# Patient Record
Sex: Female | Born: 1945
Health system: Southern US, Community
[De-identification: ages and names within clinical notes are randomized; demographics above are authoritative.]

## PROBLEM LIST (undated history)

## (undated) DIAGNOSIS — N186 End stage renal disease: Secondary | ICD-10-CM

## (undated) DIAGNOSIS — A4901 Methicillin susceptible Staphylococcus aureus infection, unspecified site: Secondary | ICD-10-CM

## (undated) DIAGNOSIS — R0600 Dyspnea, unspecified: Secondary | ICD-10-CM

## (undated) DIAGNOSIS — R51 Headache: Secondary | ICD-10-CM

## (undated) DIAGNOSIS — G8929 Other chronic pain: Secondary | ICD-10-CM

## (undated) DIAGNOSIS — M25471 Effusion, right ankle: Secondary | ICD-10-CM

## (undated) DIAGNOSIS — D649 Anemia, unspecified: Secondary | ICD-10-CM

## (undated) DIAGNOSIS — H5789 Other specified disorders of eye and adnexa: Secondary | ICD-10-CM

## (undated) DIAGNOSIS — I10 Essential (primary) hypertension: Secondary | ICD-10-CM

## (undated) DIAGNOSIS — M545 Low back pain, unspecified: Secondary | ICD-10-CM

## (undated) DIAGNOSIS — T1490XA Injury, unspecified, initial encounter: Secondary | ICD-10-CM

## (undated) DIAGNOSIS — IMO0002 Reserved for concepts with insufficient information to code with codable children: Secondary | ICD-10-CM

## (undated) DIAGNOSIS — I2510 Atherosclerotic heart disease of native coronary artery without angina pectoris: Secondary | ICD-10-CM

## (undated) DIAGNOSIS — M1990 Unspecified osteoarthritis, unspecified site: Secondary | ICD-10-CM

## (undated) DIAGNOSIS — R011 Cardiac murmur, unspecified: Secondary | ICD-10-CM

## (undated) DIAGNOSIS — G4733 Obstructive sleep apnea (adult) (pediatric): Secondary | ICD-10-CM

## (undated) DIAGNOSIS — M109 Gout, unspecified: Secondary | ICD-10-CM

## (undated) DIAGNOSIS — M25472 Effusion, left ankle: Secondary | ICD-10-CM

## (undated) DIAGNOSIS — E119 Type 2 diabetes mellitus without complications: Secondary | ICD-10-CM

## (undated) DIAGNOSIS — Z8719 Personal history of other diseases of the digestive system: Secondary | ICD-10-CM

## (undated) DIAGNOSIS — Z992 Dependence on renal dialysis: Secondary | ICD-10-CM

## (undated) DIAGNOSIS — R0602 Shortness of breath: Secondary | ICD-10-CM

## (undated) DIAGNOSIS — I219 Acute myocardial infarction, unspecified: Secondary | ICD-10-CM

## (undated) DIAGNOSIS — H5440 Blindness, one eye, unspecified eye: Secondary | ICD-10-CM

## (undated) DIAGNOSIS — I499 Cardiac arrhythmia, unspecified: Secondary | ICD-10-CM

## (undated) DIAGNOSIS — G709 Myoneural disorder, unspecified: Secondary | ICD-10-CM

## (undated) DIAGNOSIS — Z9289 Personal history of other medical treatment: Secondary | ICD-10-CM

## (undated) DIAGNOSIS — I509 Heart failure, unspecified: Secondary | ICD-10-CM

## (undated) DIAGNOSIS — Z9989 Dependence on other enabling machines and devices: Secondary | ICD-10-CM

## (undated) HISTORY — PX: LUMBAR DISC SURGERY: SHX700

## (undated) HISTORY — DX: Reserved for concepts with insufficient information to code with codable children: IMO0002

## (undated) HISTORY — PX: HERNIA REPAIR: SHX51

## (undated) HISTORY — PX: VENTRAL HERNIA REPAIR: SHX424

## (undated) HISTORY — PX: BACK SURGERY: SHX140

## (undated) HISTORY — PX: JOINT REPLACEMENT: SHX530

## (undated) HISTORY — DX: Unspecified osteoarthritis, unspecified site: M19.90

## (undated) HISTORY — DX: Methicillin susceptible Staphylococcus aureus infection, unspecified site: A49.01

## (undated) HISTORY — PX: APPENDECTOMY: SHX54

## (undated) HISTORY — PX: EYE SURGERY: SHX253

---
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
## 1968-09-20 HISTORY — PX: VAGINAL HYSTERECTOMY: SUR661

## 1978-09-21 HISTORY — PX: CARDIAC CATHETERIZATION: SHX172

## 1978-09-21 HISTORY — PX: TOTAL KNEE ARTHROPLASTY: SHX125

## 1988-09-20 HISTORY — PX: CATARACT EXTRACTION W/ INTRAOCULAR LENS IMPLANT: SHX1309

## 1997-05-19 ENCOUNTER — Encounter: Admission: RE | Admit: 1997-05-19 | Discharge: 1997-05-19 | Payer: Self-pay | Admitting: Internal Medicine

## 1997-06-07 ENCOUNTER — Encounter: Admission: RE | Admit: 1997-06-07 | Discharge: 1997-06-07 | Payer: Self-pay | Admitting: Hematology and Oncology

## 1997-07-06 ENCOUNTER — Encounter: Admission: RE | Admit: 1997-07-06 | Discharge: 1997-07-06 | Payer: Self-pay | Admitting: Hematology and Oncology

## 1997-08-07 ENCOUNTER — Encounter: Admission: RE | Admit: 1997-08-07 | Discharge: 1997-08-07 | Payer: Self-pay | Admitting: Internal Medicine

## 1997-11-13 ENCOUNTER — Encounter: Admission: RE | Admit: 1997-11-13 | Discharge: 1997-11-13 | Payer: Self-pay | Admitting: Internal Medicine

## 1997-11-13 ENCOUNTER — Ambulatory Visit (HOSPITAL_COMMUNITY): Admission: RE | Admit: 1997-11-13 | Discharge: 1997-11-13 | Payer: Self-pay | Admitting: Internal Medicine

## 1997-11-30 ENCOUNTER — Ambulatory Visit (HOSPITAL_COMMUNITY): Admission: RE | Admit: 1997-11-30 | Discharge: 1997-11-30 | Payer: Self-pay

## 1997-12-22 ENCOUNTER — Encounter: Admission: RE | Admit: 1997-12-22 | Discharge: 1997-12-22 | Payer: Self-pay | Admitting: Internal Medicine

## 1998-03-18 ENCOUNTER — Ambulatory Visit: Admission: RE | Admit: 1998-03-18 | Discharge: 1998-03-18 | Payer: Self-pay

## 1998-05-01 ENCOUNTER — Encounter: Admission: RE | Admit: 1998-05-01 | Discharge: 1998-05-01 | Payer: Self-pay | Admitting: Hematology and Oncology

## 1998-06-17 ENCOUNTER — Ambulatory Visit: Admission: RE | Admit: 1998-06-17 | Discharge: 1998-06-17 | Payer: Self-pay

## 1998-07-27 ENCOUNTER — Encounter: Admission: RE | Admit: 1998-07-27 | Discharge: 1998-07-27 | Payer: Self-pay | Admitting: Internal Medicine

## 1998-08-08 ENCOUNTER — Encounter: Admission: RE | Admit: 1998-08-08 | Discharge: 1998-08-08 | Payer: Self-pay | Admitting: Hematology and Oncology

## 1998-08-20 ENCOUNTER — Encounter: Admission: RE | Admit: 1998-08-20 | Discharge: 1998-08-20 | Payer: Self-pay | Admitting: Internal Medicine

## 1998-09-26 ENCOUNTER — Ambulatory Visit (HOSPITAL_COMMUNITY): Admission: RE | Admit: 1998-09-26 | Discharge: 1998-09-26 | Payer: Self-pay | Admitting: Orthopedic Surgery

## 1998-09-26 ENCOUNTER — Encounter: Payer: Self-pay | Admitting: Orthopedic Surgery

## 1998-10-16 ENCOUNTER — Encounter: Payer: Self-pay | Admitting: *Deleted

## 1998-10-16 ENCOUNTER — Ambulatory Visit (HOSPITAL_COMMUNITY): Admission: RE | Admit: 1998-10-16 | Discharge: 1998-10-16 | Payer: Self-pay | Admitting: Internal Medicine

## 1998-10-16 ENCOUNTER — Encounter: Admission: RE | Admit: 1998-10-16 | Discharge: 1998-10-16 | Payer: Self-pay | Admitting: Internal Medicine

## 1998-11-19 ENCOUNTER — Encounter: Admission: RE | Admit: 1998-11-19 | Discharge: 1998-11-19 | Payer: Self-pay

## 1998-11-26 ENCOUNTER — Encounter: Admission: RE | Admit: 1998-11-26 | Discharge: 1998-11-26 | Payer: Self-pay | Admitting: Hematology and Oncology

## 1999-01-28 ENCOUNTER — Encounter: Payer: Self-pay | Admitting: Internal Medicine

## 1999-01-28 ENCOUNTER — Encounter: Admission: RE | Admit: 1999-01-28 | Discharge: 1999-01-28 | Payer: Self-pay | Admitting: Internal Medicine

## 1999-01-28 ENCOUNTER — Ambulatory Visit (HOSPITAL_COMMUNITY): Admission: RE | Admit: 1999-01-28 | Discharge: 1999-01-28 | Payer: Self-pay | Admitting: Internal Medicine

## 1999-02-04 ENCOUNTER — Encounter: Admission: RE | Admit: 1999-02-04 | Discharge: 1999-02-04 | Payer: Self-pay | Admitting: Internal Medicine

## 1999-02-11 ENCOUNTER — Encounter: Admission: RE | Admit: 1999-02-11 | Discharge: 1999-02-11 | Payer: Self-pay | Admitting: Internal Medicine

## 1999-02-18 ENCOUNTER — Encounter: Payer: Self-pay | Admitting: Emergency Medicine

## 1999-02-18 ENCOUNTER — Inpatient Hospital Stay (HOSPITAL_COMMUNITY): Admission: EM | Admit: 1999-02-18 | Discharge: 1999-02-22 | Payer: Self-pay | Admitting: Emergency Medicine

## 1999-02-27 ENCOUNTER — Encounter: Admission: RE | Admit: 1999-02-27 | Discharge: 1999-02-27 | Payer: Self-pay | Admitting: Internal Medicine

## 1999-05-27 ENCOUNTER — Encounter: Admission: RE | Admit: 1999-05-27 | Discharge: 1999-05-27 | Payer: Self-pay | Admitting: Internal Medicine

## 1999-12-29 ENCOUNTER — Inpatient Hospital Stay (HOSPITAL_COMMUNITY): Admission: EM | Admit: 1999-12-29 | Discharge: 2000-01-02 | Payer: Self-pay

## 1999-12-29 ENCOUNTER — Encounter: Payer: Self-pay | Admitting: Emergency Medicine

## 1999-12-29 ENCOUNTER — Encounter: Payer: Self-pay | Admitting: Internal Medicine

## 1999-12-31 ENCOUNTER — Encounter: Payer: Self-pay | Admitting: Internal Medicine

## 2000-02-10 ENCOUNTER — Encounter: Admission: RE | Admit: 2000-02-10 | Discharge: 2000-02-10 | Payer: Self-pay

## 2000-02-10 ENCOUNTER — Ambulatory Visit (HOSPITAL_COMMUNITY): Admission: RE | Admit: 2000-02-10 | Discharge: 2000-02-10 | Payer: Self-pay | Admitting: Hematology and Oncology

## 2000-02-12 ENCOUNTER — Encounter: Admission: RE | Admit: 2000-02-12 | Discharge: 2000-05-12 | Payer: Self-pay

## 2000-02-21 ENCOUNTER — Ambulatory Visit (HOSPITAL_COMMUNITY): Admission: RE | Admit: 2000-02-21 | Discharge: 2000-02-21 | Payer: Self-pay

## 2000-03-16 ENCOUNTER — Encounter: Admission: RE | Admit: 2000-03-16 | Discharge: 2000-03-16 | Payer: Self-pay

## 2000-04-27 ENCOUNTER — Encounter: Admission: RE | Admit: 2000-04-27 | Discharge: 2000-04-27 | Payer: Self-pay

## 2000-05-04 ENCOUNTER — Encounter: Admission: RE | Admit: 2000-05-04 | Discharge: 2000-05-04 | Payer: Self-pay

## 2000-05-20 ENCOUNTER — Encounter: Admission: RE | Admit: 2000-05-20 | Discharge: 2000-05-20 | Payer: Self-pay | Admitting: Hematology and Oncology

## 2000-06-02 ENCOUNTER — Encounter: Admission: RE | Admit: 2000-06-02 | Discharge: 2000-06-02 | Payer: Self-pay

## 2000-06-02 ENCOUNTER — Ambulatory Visit (HOSPITAL_COMMUNITY): Admission: RE | Admit: 2000-06-02 | Discharge: 2000-06-02 | Payer: Self-pay

## 2000-09-14 ENCOUNTER — Encounter: Admission: RE | Admit: 2000-09-14 | Discharge: 2000-09-14 | Payer: Self-pay | Admitting: Internal Medicine

## 2000-09-17 ENCOUNTER — Encounter: Admission: RE | Admit: 2000-09-17 | Discharge: 2000-09-17 | Payer: Self-pay | Admitting: Internal Medicine

## 2000-10-20 ENCOUNTER — Encounter: Admission: RE | Admit: 2000-10-20 | Discharge: 2000-10-20 | Payer: Self-pay | Admitting: Internal Medicine

## 2001-01-26 ENCOUNTER — Ambulatory Visit (HOSPITAL_COMMUNITY): Admission: RE | Admit: 2001-01-26 | Discharge: 2001-01-26 | Payer: Self-pay

## 2001-01-26 ENCOUNTER — Encounter: Payer: Self-pay | Admitting: Internal Medicine

## 2001-01-26 ENCOUNTER — Inpatient Hospital Stay (HOSPITAL_COMMUNITY): Admission: AD | Admit: 2001-01-26 | Discharge: 2001-01-29 | Payer: Self-pay

## 2001-01-26 ENCOUNTER — Encounter: Admission: RE | Admit: 2001-01-26 | Discharge: 2001-01-26 | Payer: Self-pay

## 2001-01-27 ENCOUNTER — Encounter: Payer: Self-pay | Admitting: Internal Medicine

## 2001-02-05 ENCOUNTER — Encounter: Admission: RE | Admit: 2001-02-05 | Discharge: 2001-02-05 | Payer: Self-pay | Admitting: Internal Medicine

## 2001-02-09 ENCOUNTER — Encounter: Admission: RE | Admit: 2001-02-09 | Discharge: 2001-02-09 | Payer: Self-pay

## 2001-02-16 ENCOUNTER — Encounter: Admission: RE | Admit: 2001-02-16 | Discharge: 2001-02-16 | Payer: Self-pay | Admitting: Internal Medicine

## 2001-03-04 ENCOUNTER — Encounter: Admission: RE | Admit: 2001-03-04 | Discharge: 2001-03-04 | Payer: Self-pay | Admitting: Internal Medicine

## 2001-04-01 ENCOUNTER — Ambulatory Visit (HOSPITAL_COMMUNITY): Admission: RE | Admit: 2001-04-01 | Discharge: 2001-04-01 | Payer: Self-pay | Admitting: Internal Medicine

## 2001-06-23 ENCOUNTER — Ambulatory Visit (HOSPITAL_COMMUNITY): Admission: RE | Admit: 2001-06-23 | Discharge: 2001-06-23 | Payer: Self-pay | Admitting: Internal Medicine

## 2001-06-23 ENCOUNTER — Encounter: Payer: Self-pay | Admitting: Internal Medicine

## 2001-06-23 ENCOUNTER — Encounter: Admission: RE | Admit: 2001-06-23 | Discharge: 2001-06-23 | Payer: Self-pay | Admitting: Internal Medicine

## 2001-09-28 ENCOUNTER — Encounter: Admission: RE | Admit: 2001-09-28 | Discharge: 2001-09-28 | Payer: Self-pay | Admitting: Internal Medicine

## 2001-09-28 ENCOUNTER — Ambulatory Visit (HOSPITAL_COMMUNITY): Admission: RE | Admit: 2001-09-28 | Discharge: 2001-09-28 | Payer: Self-pay | Admitting: Internal Medicine

## 2001-10-12 ENCOUNTER — Encounter: Admission: RE | Admit: 2001-10-12 | Discharge: 2001-10-12 | Payer: Self-pay | Admitting: Internal Medicine

## 2001-12-26 ENCOUNTER — Inpatient Hospital Stay (HOSPITAL_COMMUNITY): Admission: EM | Admit: 2001-12-26 | Discharge: 2001-12-31 | Payer: Self-pay | Admitting: Emergency Medicine

## 2001-12-26 ENCOUNTER — Encounter: Payer: Self-pay | Admitting: Emergency Medicine

## 2001-12-27 ENCOUNTER — Encounter: Payer: Self-pay | Admitting: Internal Medicine

## 2001-12-29 ENCOUNTER — Encounter: Payer: Self-pay | Admitting: Internal Medicine

## 2002-01-06 ENCOUNTER — Encounter: Admission: RE | Admit: 2002-01-06 | Discharge: 2002-01-06 | Payer: Self-pay | Admitting: Internal Medicine

## 2002-01-11 ENCOUNTER — Encounter: Admission: RE | Admit: 2002-01-11 | Discharge: 2002-01-11 | Payer: Self-pay | Admitting: Internal Medicine

## 2002-03-19 ENCOUNTER — Encounter: Payer: Self-pay | Admitting: Emergency Medicine

## 2002-03-19 ENCOUNTER — Emergency Department (HOSPITAL_COMMUNITY): Admission: EM | Admit: 2002-03-19 | Discharge: 2002-03-19 | Payer: Self-pay | Admitting: Emergency Medicine

## 2002-03-22 ENCOUNTER — Encounter: Admission: RE | Admit: 2002-03-22 | Discharge: 2002-03-22 | Payer: Self-pay | Admitting: Internal Medicine

## 2002-04-04 ENCOUNTER — Encounter: Payer: Self-pay | Admitting: Internal Medicine

## 2002-04-04 ENCOUNTER — Ambulatory Visit (HOSPITAL_COMMUNITY): Admission: RE | Admit: 2002-04-04 | Discharge: 2002-04-04 | Payer: Self-pay | Admitting: Internal Medicine

## 2002-04-05 ENCOUNTER — Encounter: Admission: RE | Admit: 2002-04-05 | Discharge: 2002-04-05 | Payer: Self-pay | Admitting: Internal Medicine

## 2002-04-27 ENCOUNTER — Encounter (INDEPENDENT_AMBULATORY_CARE_PROVIDER_SITE_OTHER): Payer: Self-pay

## 2002-04-27 ENCOUNTER — Encounter: Payer: Self-pay | Admitting: Emergency Medicine

## 2002-04-28 ENCOUNTER — Encounter: Payer: Self-pay | Admitting: Internal Medicine

## 2002-04-28 ENCOUNTER — Inpatient Hospital Stay (HOSPITAL_COMMUNITY): Admission: EM | Admit: 2002-04-28 | Discharge: 2002-05-08 | Payer: Self-pay | Admitting: Emergency Medicine

## 2002-05-02 ENCOUNTER — Encounter: Payer: Self-pay | Admitting: Internal Medicine

## 2002-05-08 ENCOUNTER — Inpatient Hospital Stay: Admission: RE | Admit: 2002-05-08 | Discharge: 2002-05-12 | Payer: Self-pay | Admitting: Internal Medicine

## 2002-05-10 ENCOUNTER — Ambulatory Visit (HOSPITAL_COMMUNITY): Admission: RE | Admit: 2002-05-10 | Discharge: 2002-05-10 | Payer: Self-pay | Admitting: Internal Medicine

## 2002-05-31 ENCOUNTER — Encounter: Admission: RE | Admit: 2002-05-31 | Discharge: 2002-05-31 | Payer: Self-pay | Admitting: Internal Medicine

## 2002-06-14 ENCOUNTER — Encounter: Admission: RE | Admit: 2002-06-14 | Discharge: 2002-06-14 | Payer: Self-pay | Admitting: Internal Medicine

## 2002-10-24 ENCOUNTER — Ambulatory Visit (HOSPITAL_COMMUNITY): Admission: RE | Admit: 2002-10-24 | Discharge: 2002-10-24 | Payer: Self-pay | Admitting: Internal Medicine

## 2002-10-24 ENCOUNTER — Encounter: Payer: Self-pay | Admitting: Internal Medicine

## 2002-10-24 ENCOUNTER — Encounter: Admission: RE | Admit: 2002-10-24 | Discharge: 2002-10-24 | Payer: Self-pay | Admitting: Internal Medicine

## 2002-10-26 ENCOUNTER — Encounter: Admission: RE | Admit: 2002-10-26 | Discharge: 2002-10-26 | Payer: Self-pay | Admitting: Internal Medicine

## 2002-12-13 ENCOUNTER — Encounter: Admission: RE | Admit: 2002-12-13 | Discharge: 2002-12-13 | Payer: Self-pay | Admitting: Internal Medicine

## 2002-12-26 ENCOUNTER — Ambulatory Visit (HOSPITAL_BASED_OUTPATIENT_CLINIC_OR_DEPARTMENT_OTHER): Admission: RE | Admit: 2002-12-26 | Discharge: 2002-12-26 | Payer: Self-pay | Admitting: Internal Medicine

## 2003-03-10 ENCOUNTER — Encounter: Admission: RE | Admit: 2003-03-10 | Discharge: 2003-03-10 | Payer: Self-pay | Admitting: Internal Medicine

## 2003-03-16 ENCOUNTER — Encounter: Admission: RE | Admit: 2003-03-16 | Discharge: 2003-03-16 | Payer: Self-pay | Admitting: Internal Medicine

## 2003-04-05 ENCOUNTER — Encounter: Admission: RE | Admit: 2003-04-05 | Discharge: 2003-04-05 | Payer: Self-pay | Admitting: Internal Medicine

## 2003-05-12 ENCOUNTER — Encounter: Admission: RE | Admit: 2003-05-12 | Discharge: 2003-05-12 | Payer: Self-pay | Admitting: Internal Medicine

## 2003-06-03 ENCOUNTER — Inpatient Hospital Stay (HOSPITAL_COMMUNITY): Admission: EM | Admit: 2003-06-03 | Discharge: 2003-06-07 | Payer: Self-pay | Admitting: Emergency Medicine

## 2003-06-04 ENCOUNTER — Encounter: Admission: RE | Admit: 2003-06-04 | Discharge: 2003-06-04 | Payer: Self-pay | Admitting: Internal Medicine

## 2003-06-06 ENCOUNTER — Encounter (INDEPENDENT_AMBULATORY_CARE_PROVIDER_SITE_OTHER): Payer: Self-pay | Admitting: Cardiology

## 2003-07-07 ENCOUNTER — Encounter: Admission: RE | Admit: 2003-07-07 | Discharge: 2003-07-07 | Payer: Self-pay | Admitting: Internal Medicine

## 2003-07-27 ENCOUNTER — Encounter: Admission: RE | Admit: 2003-07-27 | Discharge: 2003-08-24 | Payer: Self-pay | Admitting: Internal Medicine

## 2003-09-05 IMAGING — CT CT ABDOMEN W/O CM
1 series · 15 of 32 positions shown, 19 images · non-contrast
Comparison: none

FINDINGS
CLINICAL DATA: RIGHT FLANK PAIN.
ABDOMEN CT WITHOUT CONTRAST
COMPARISON 12/01/95.
VISUALIZED PORTIONS OF LUNG BASES ARE CLEAR.  UNREMARKABLE NONCONTRAST EVALUATION OF VISUALIZED
PORTIONS OF LIVER AND SPLEEN, GALLBLADDER, ADRENAL GLANDS, PANCREAS, KIDNEYS.  PROXIMAL URETER IS
DECOMPRESSED.  SMALL BOWEL DECOMPRESSED.  NO FREE AIR.  NO ASCITES.  SUBCENTIMETER LEFT PARA-AORTIC
AND AORTOCAVAL LYMPH NODES.  NO ASCITES.  NO FREE AIR.  MINIMAL ATHEROMATOUS CHANGES AT THE AORTIC
BIFURCATION.  NO ANEURYSM.
IMPRESSION
UNREMARKABLE NONCONTRAST EVALUATION OF THE ABDOMEN.
CT PELVIS WITHOUT CONTRAST
SPIRAL CT WITHOUT ORAL OR IV CONTRAST.  THERE IS A MOBILE CECUM.  COLON IS DECOMPRESSED.  THE
APPENDIX IS NOT DISCRETELY IDENTIFIED.  NO INFLAMMATORY CHANGE OR EXTRALUMINAL FLUID COLLECTIONS
ARE EVIDENT, HOWEVER.  THE UTERUS IS APPARENTLY SURGICALLY ABSENT.  URINARY BLADDER PHYSIOLOGICALLY
DISTENDED.  PHLEBOLITHS IN THE LOWER PELVIS.  NO DISTAL URETERAL CALCULI ARE EVIDENT.
SUBCENTIMETER BILATERAL INGUINAL LYMPH NODES ARE NOTED.
UNREMARKABLE CT PELVIS.  THERE IS A MOBILE CECUM; THE APPENDIX IS NOT DISCRETELY IDENTIFIED.

[Series 2: renal stone · axial · 0.98mm/px · z∈[-481,-106]mm · 15 of 84 slices shown, 19 images]
[im 6/84  soft-tissue]
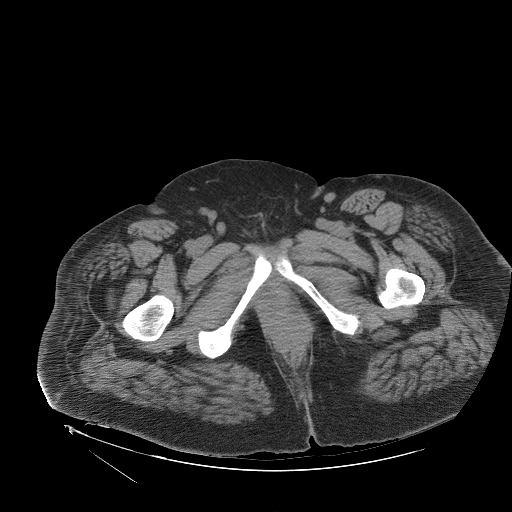
[im 6/84  bone]
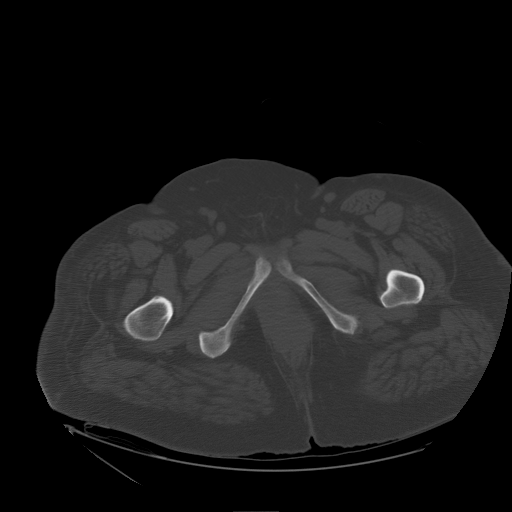
[im 11/84  soft-tissue]
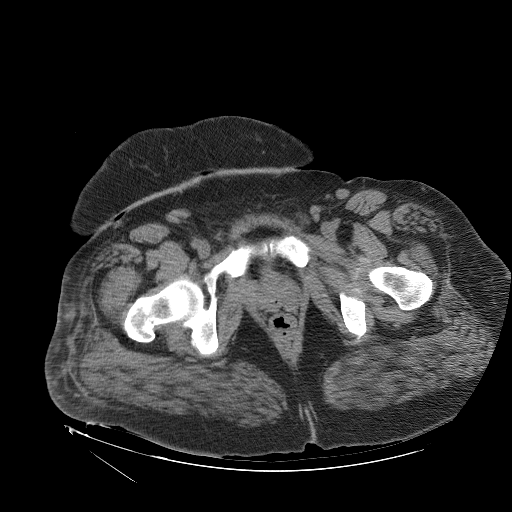
[im 17/84  soft-tissue]
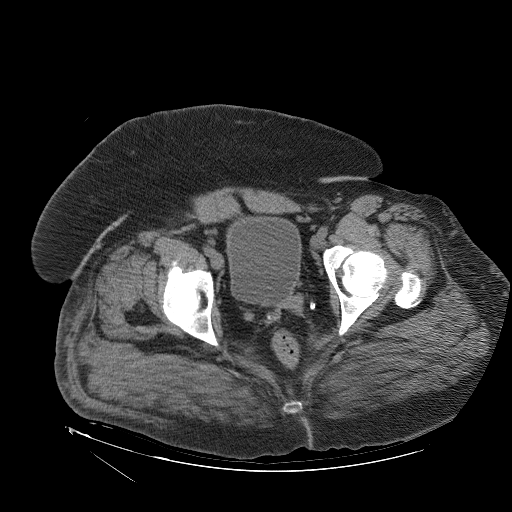
[im 25/84  soft-tissue]
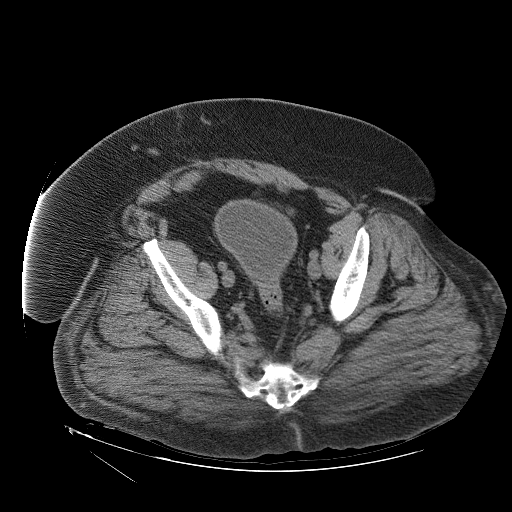
[im 30/84  soft-tissue]
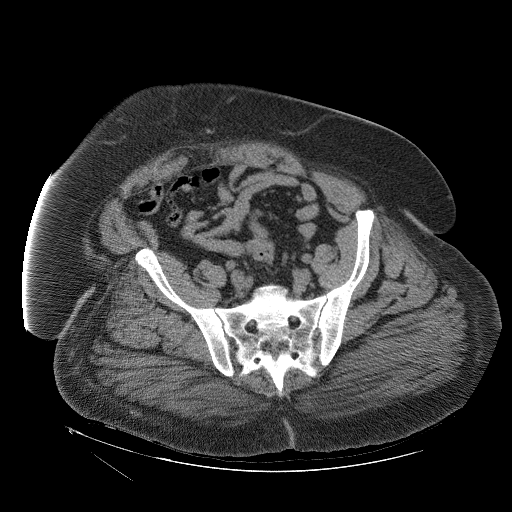
[im 35/84  soft-tissue]
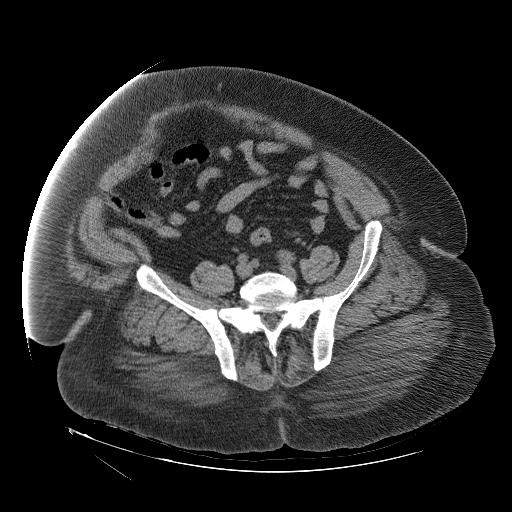
[im 43/84  soft-tissue]
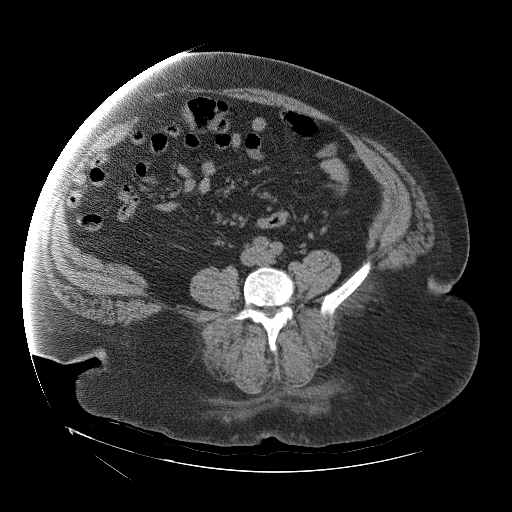
[im 49/84  soft-tissue]
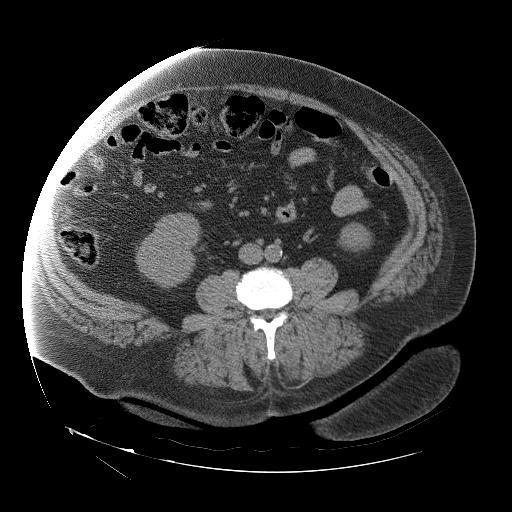
[im 54/84  soft-tissue]
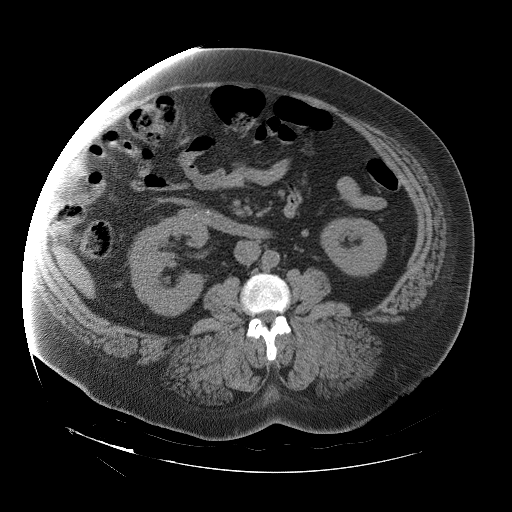
[im 54/84  bone]
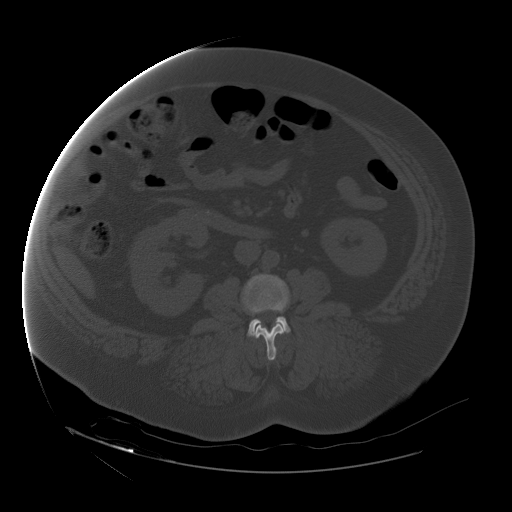
[im 59/84  soft-tissue]
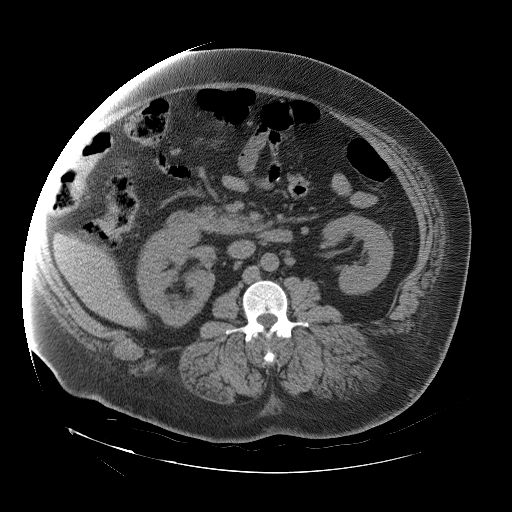
[im 67/84  soft-tissue]
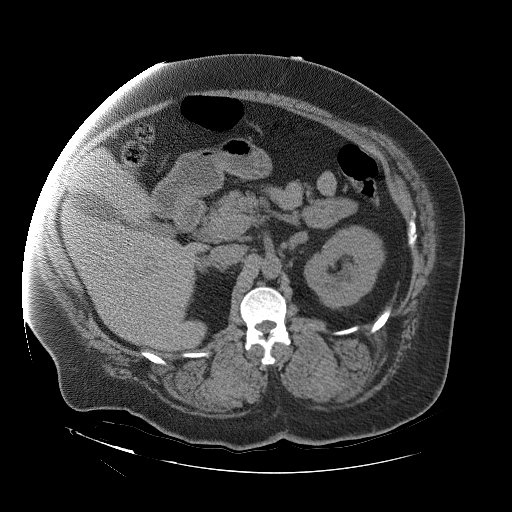
[im 73/84  soft-tissue]
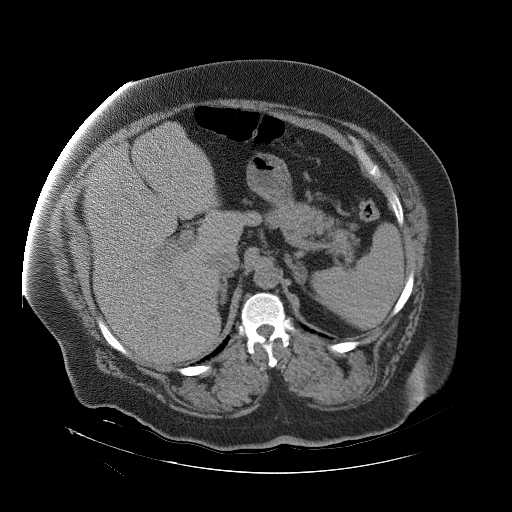
[im 73/84  lung]
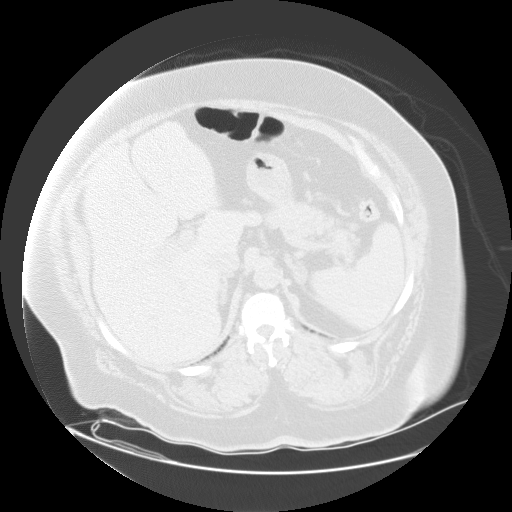
[im 75/84  lung]
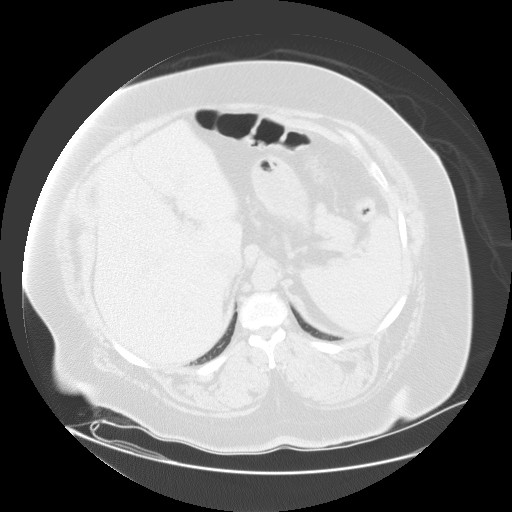
[im 78/84  soft-tissue]
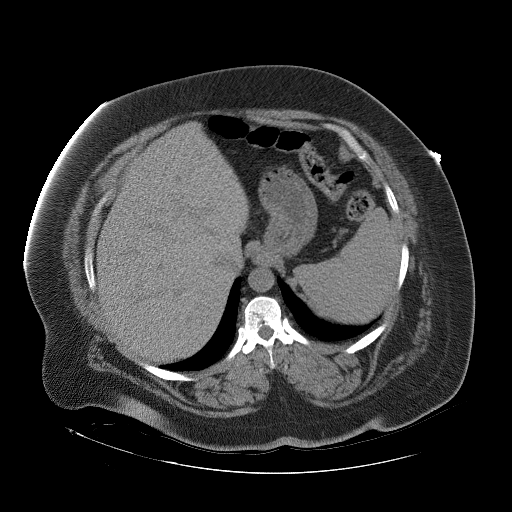
[im 78/84  lung]
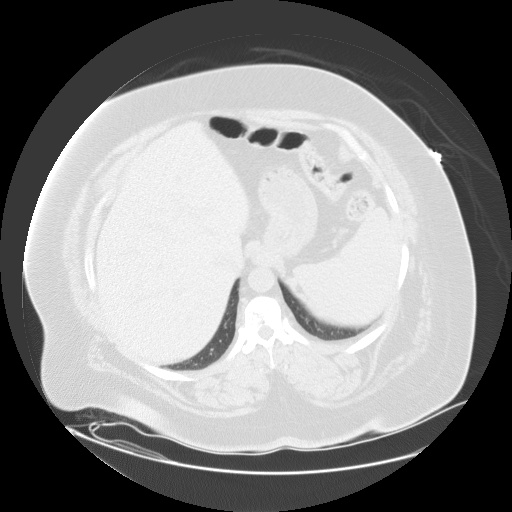
[im 81/84  lung]
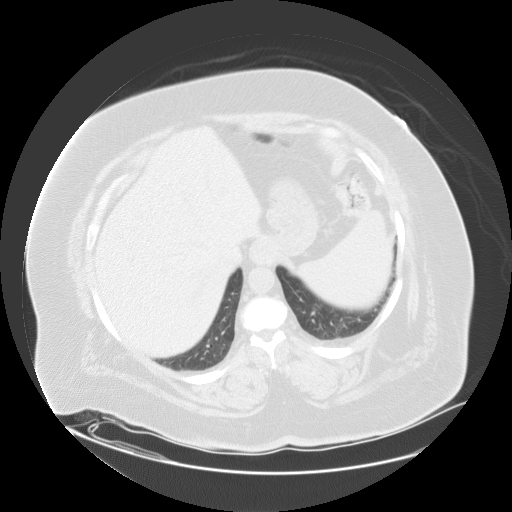

[15 of 32 positions shown; findings below may reference images not displayed]

## 2003-09-24 ENCOUNTER — Emergency Department (HOSPITAL_COMMUNITY): Admission: EM | Admit: 2003-09-24 | Discharge: 2003-09-25 | Payer: Self-pay | Admitting: Anesthesiology

## 2003-09-24 ENCOUNTER — Ambulatory Visit: Payer: Self-pay | Admitting: Internal Medicine

## 2003-09-25 ENCOUNTER — Inpatient Hospital Stay (HOSPITAL_COMMUNITY): Admission: EM | Admit: 2003-09-25 | Discharge: 2003-10-04 | Payer: Self-pay | Admitting: Emergency Medicine

## 2003-10-02 ENCOUNTER — Encounter (INDEPENDENT_AMBULATORY_CARE_PROVIDER_SITE_OTHER): Payer: Self-pay | Admitting: *Deleted

## 2003-10-14 ENCOUNTER — Emergency Department (HOSPITAL_COMMUNITY): Admission: EM | Admit: 2003-10-14 | Discharge: 2003-10-15 | Payer: Self-pay | Admitting: Emergency Medicine

## 2003-10-25 ENCOUNTER — Ambulatory Visit: Payer: Self-pay | Admitting: Internal Medicine

## 2003-10-30 ENCOUNTER — Encounter: Admission: RE | Admit: 2003-10-30 | Discharge: 2003-10-30 | Payer: Self-pay | Admitting: Internal Medicine

## 2003-11-02 ENCOUNTER — Ambulatory Visit (HOSPITAL_COMMUNITY): Admission: RE | Admit: 2003-11-02 | Discharge: 2003-11-02 | Payer: Self-pay | Admitting: Internal Medicine

## 2003-11-08 ENCOUNTER — Ambulatory Visit: Payer: Self-pay | Admitting: Internal Medicine

## 2003-11-14 ENCOUNTER — Ambulatory Visit: Payer: Self-pay | Admitting: Internal Medicine

## 2003-11-21 ENCOUNTER — Ambulatory Visit: Payer: Self-pay | Admitting: Internal Medicine

## 2003-11-21 ENCOUNTER — Inpatient Hospital Stay (HOSPITAL_COMMUNITY): Admission: AD | Admit: 2003-11-21 | Discharge: 2003-11-29 | Payer: Self-pay | Admitting: Internal Medicine

## 2003-11-21 ENCOUNTER — Emergency Department (HOSPITAL_COMMUNITY): Admission: EM | Admit: 2003-11-21 | Discharge: 2003-11-21 | Payer: Self-pay | Admitting: Emergency Medicine

## 2003-12-06 ENCOUNTER — Inpatient Hospital Stay (HOSPITAL_COMMUNITY): Admission: EM | Admit: 2003-12-06 | Discharge: 2003-12-18 | Payer: Self-pay | Admitting: Emergency Medicine

## 2003-12-06 ENCOUNTER — Ambulatory Visit: Payer: Self-pay | Admitting: Internal Medicine

## 2003-12-09 ENCOUNTER — Encounter (INDEPENDENT_AMBULATORY_CARE_PROVIDER_SITE_OTHER): Payer: Self-pay | Admitting: *Deleted

## 2003-12-18 ENCOUNTER — Inpatient Hospital Stay: Admission: RE | Admit: 2003-12-18 | Discharge: 2003-12-21 | Payer: Self-pay | Admitting: Internal Medicine

## 2004-04-12 ENCOUNTER — Encounter: Admission: RE | Admit: 2004-04-12 | Discharge: 2004-04-12 | Payer: Self-pay | Admitting: Cardiovascular Disease

## 2004-06-26 ENCOUNTER — Encounter: Admission: RE | Admit: 2004-06-26 | Discharge: 2004-07-08 | Payer: Self-pay | Admitting: Neurosurgery

## 2004-08-28 ENCOUNTER — Inpatient Hospital Stay (HOSPITAL_COMMUNITY): Admission: EM | Admit: 2004-08-28 | Discharge: 2004-09-13 | Payer: Self-pay | Admitting: Emergency Medicine

## 2004-08-28 ENCOUNTER — Ambulatory Visit: Payer: Self-pay | Admitting: Physical Medicine & Rehabilitation

## 2004-10-10 IMAGING — CR DG CHEST 2V
2 series · 2 of 2 positions shown · non-contrast
Comparison: none

CLINICAL DATA: Syncope.  Dizziness.  Headache.  Question congestive heart failure.  History of lung cancer.  Diabetic.
 AP SEMI-ERECT AND LATERAL CHEST, 06/03/03, [DATE] HOURS
 No comparisons.
 Cardiomegaly with pulmonary vascular congestion.  Tortuous aorta.  No plain film findings of pulmonary malignancy.  CT scanning can be obtained for further delineation if clinically desired.  No segmental infiltrate.
 IMPRESSION 
 Cardiomegaly and pulmonary vascular congestion.
 Tortuous aorta.
 No segmental infiltrate.  Please see above.

[view not recorded (1 of 2)]
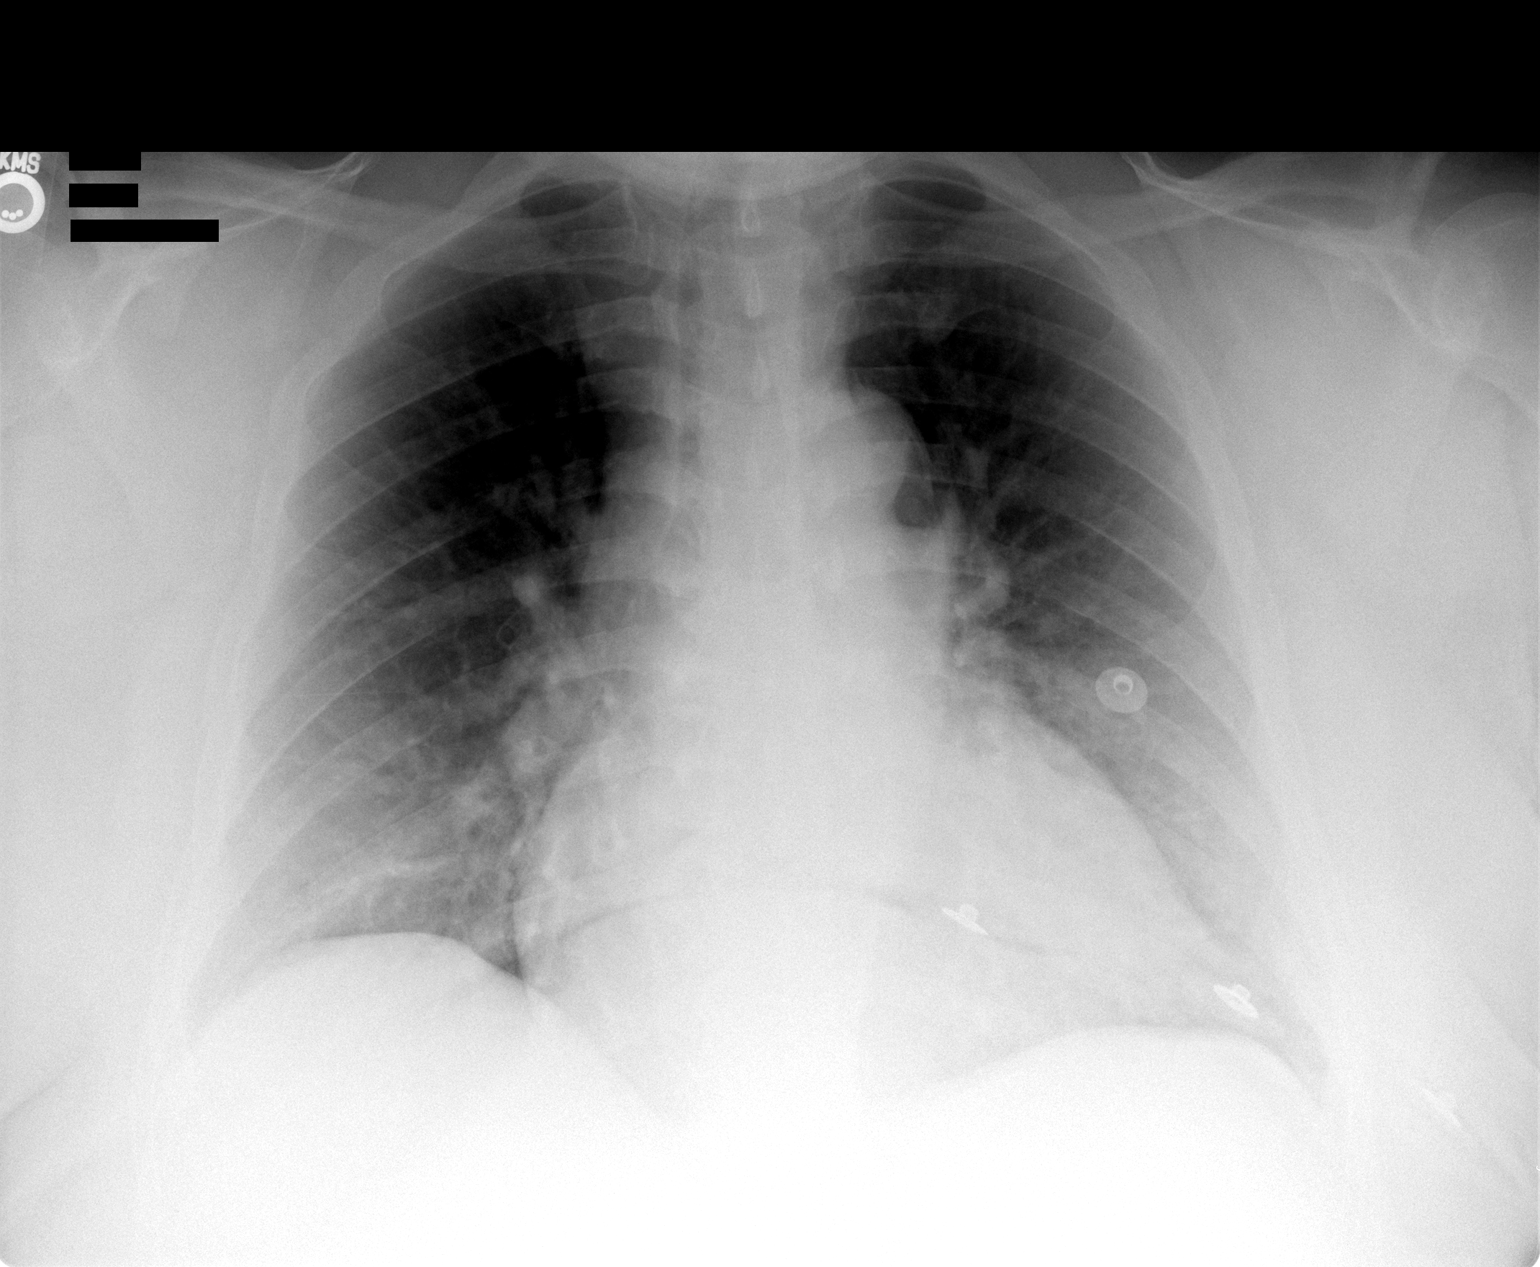

[view not recorded (2 of 2)]
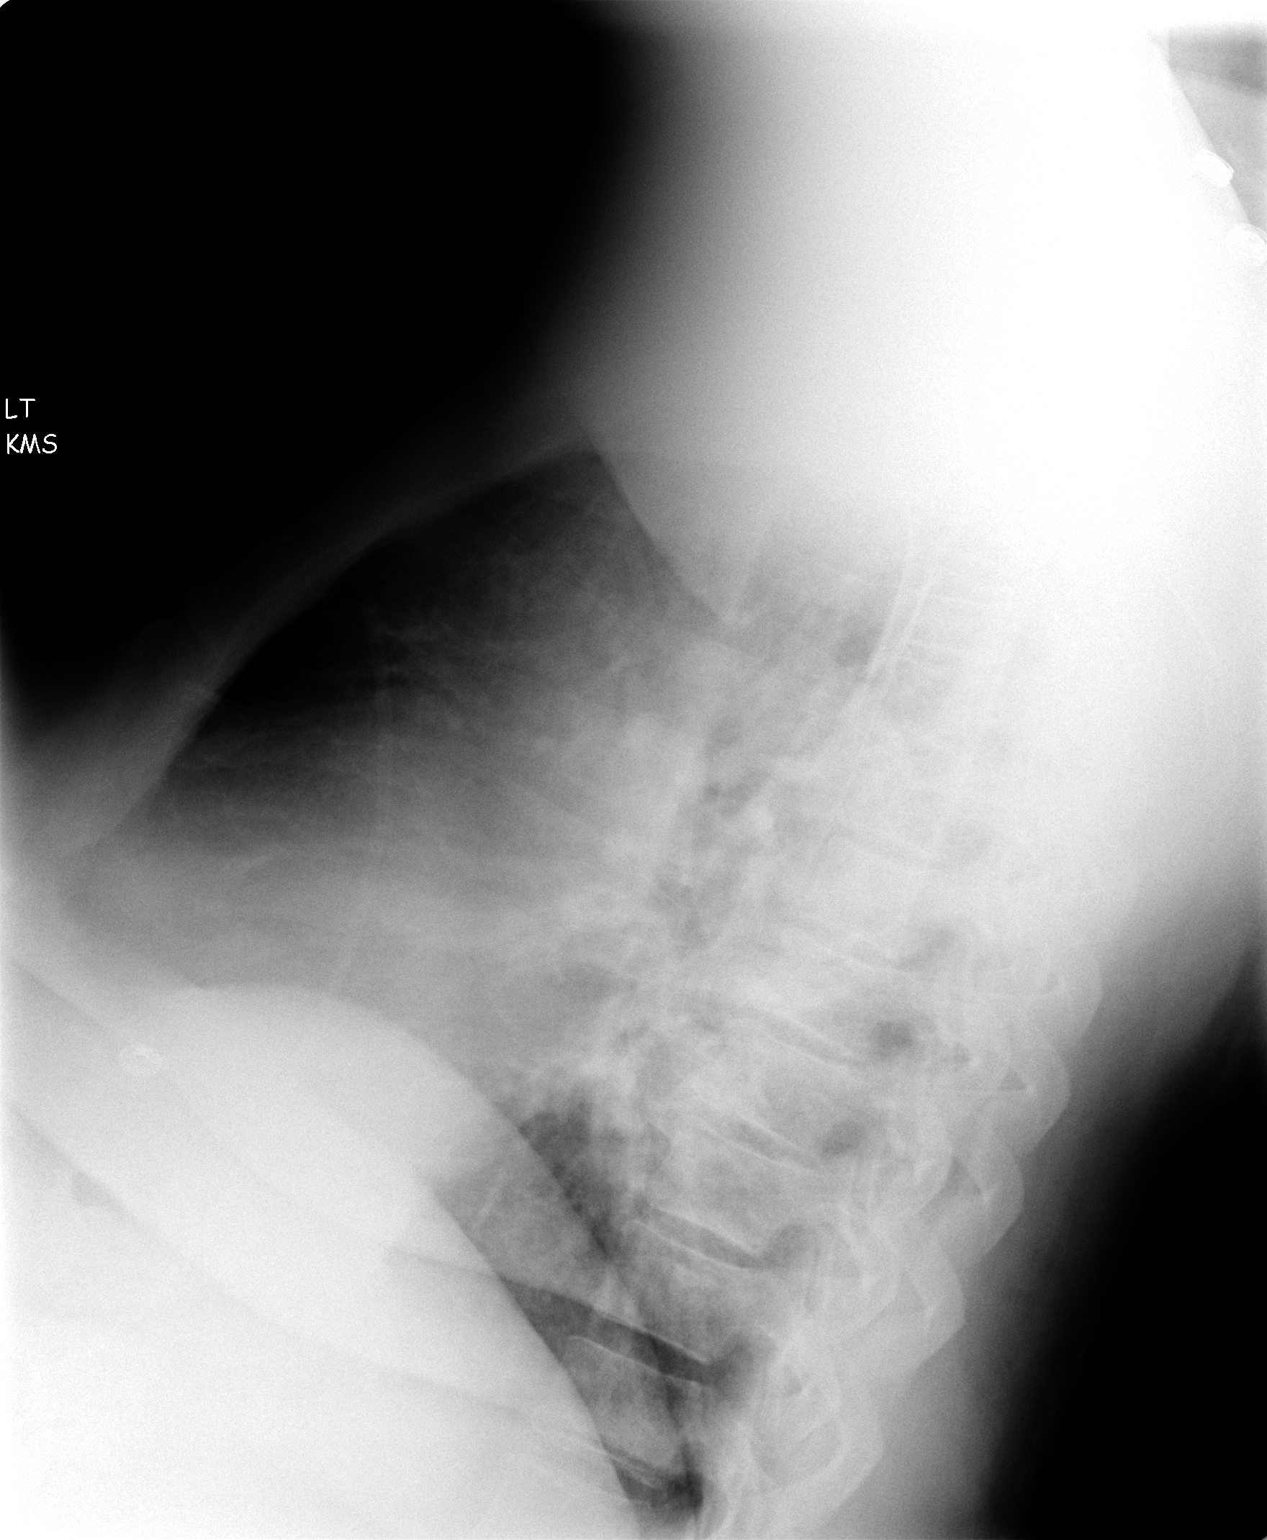

[2 of 2 positions shown; findings below may reference images not displayed]

## 2004-10-10 IMAGING — CT CT HEAD W/O CM
1 of 2 series · 13 of 30 positions shown, 17 images · non-contrast
Comparison: CT of the head 06/03/03.

***CORRECTED REPORT***
Please disregard MR head below; this has been reported separately by Dr Murtazaliev.   accession number [email or web address] for the head CT done 06/03/03 only, reported by Dr Pizzas.
CLINICAL DATA: Altered level of consciousness. 
CT OF THE HEAD WITHOUT CONTRAST
No comparison.  Routine unenhanced study was performed.  
There is no evidence of acute intracranial hemorrhage, mass effect, or extra-axial fluid collection.  The ventricles and subarachnoid spaces are appropriately sized for age.  No cortical based infarct is apparent.  There may be mild subcortical small vessel ischemic change in the parietal lobes, left greater than right.  The visualized paranasal sinuses are clear.  Calvarium is intact. 
IMPRESSION
No evidence of acute intracranial hemorrhage or acute stroke.  There may be mild small vessel ischemic change.  If there is clinical concern of acute stroke, followup CT or MRI should be considered.
CLINICAL DATA: TIAs.  Question infarct.  
MR HEAD WITHOUT CONTRAST (performed in open magnet at [REDACTED] as patient was not able to have closed magnet MRI secondary to habitus).

[Series 3: brain · axial · 0.47mm/px · z∈[+185,+297]mm · 13 of 44 slices shown, 17 images]
[im 4/44  brain]
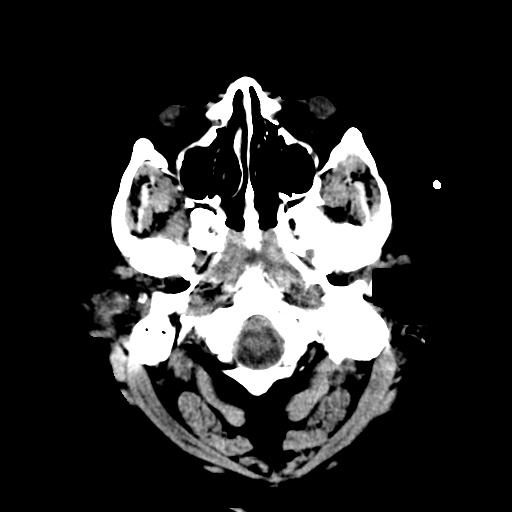
[im 4/44  bone]
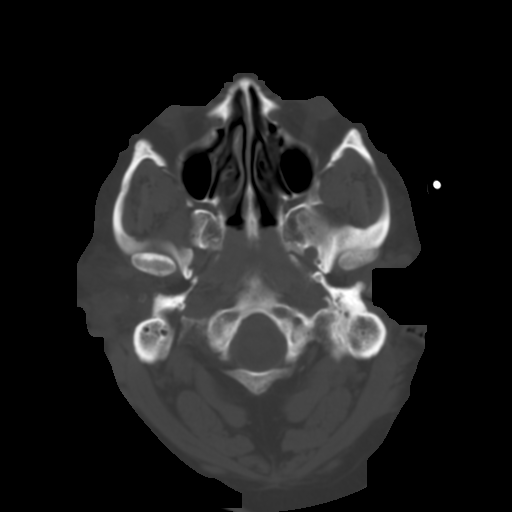
[im 7/44  brain]
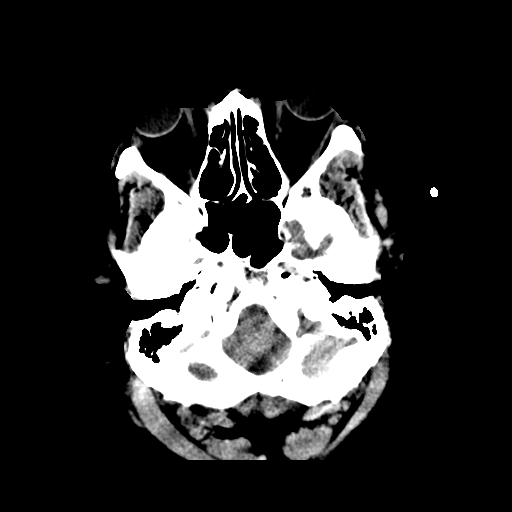
[im 10/44  brain]
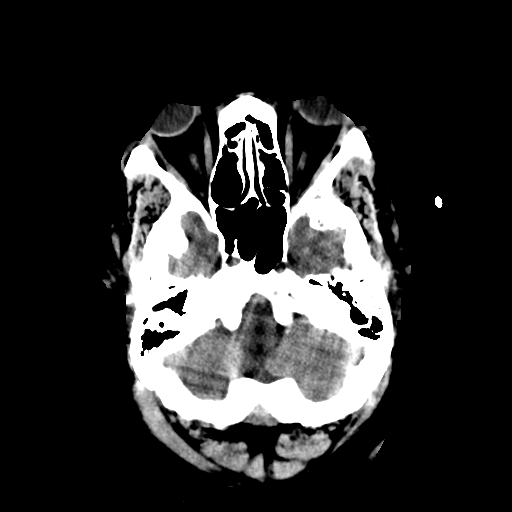
[im 13/44  brain]
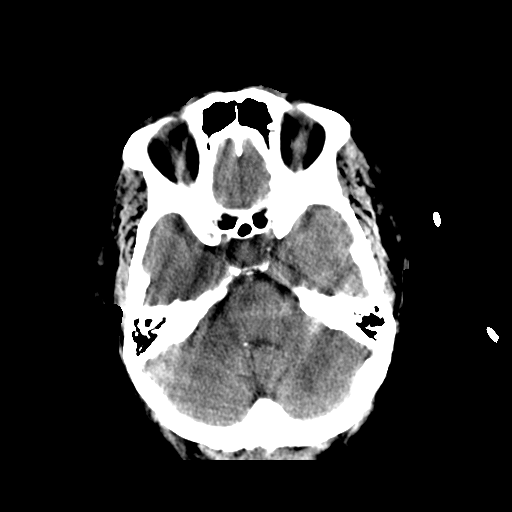
[im 16/44  brain]
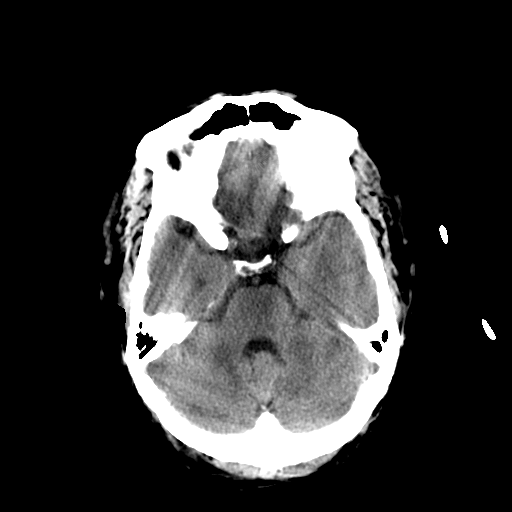
[im 16/44  bone]
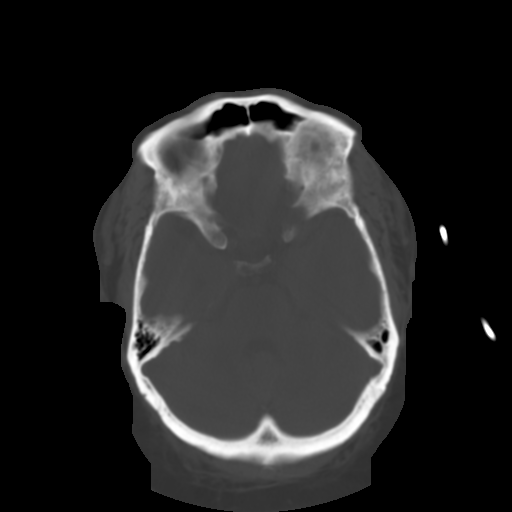
[im 19/44  brain]
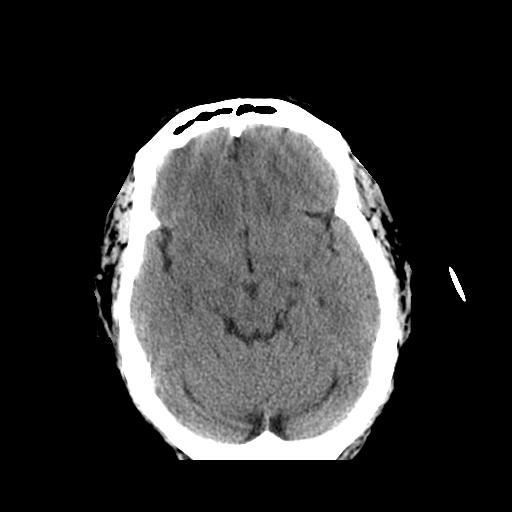
[im 22/44  brain]
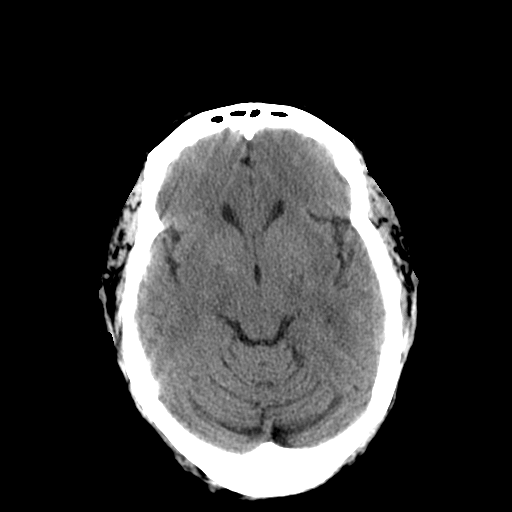
[im 25/44  brain]
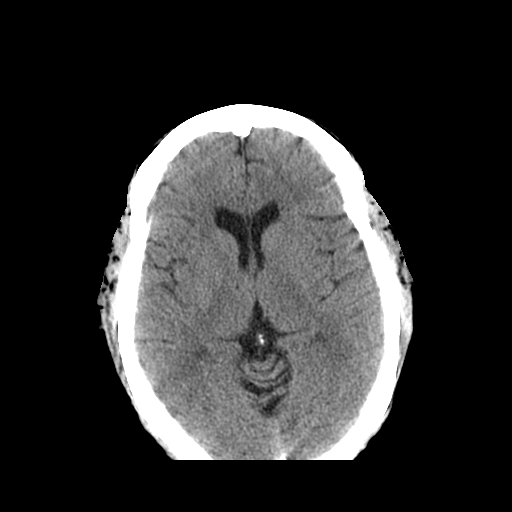
[im 28/44  brain]
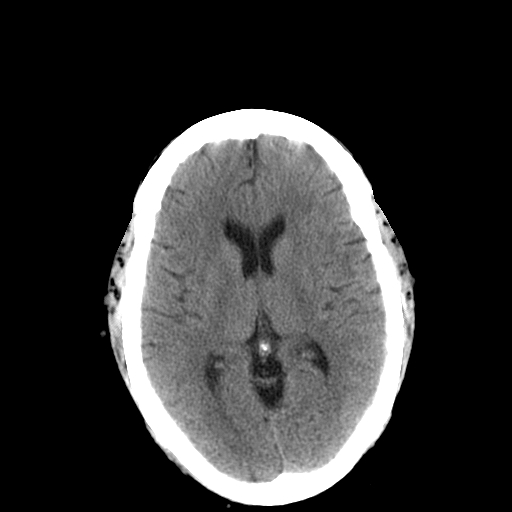
[im 28/44  bone]
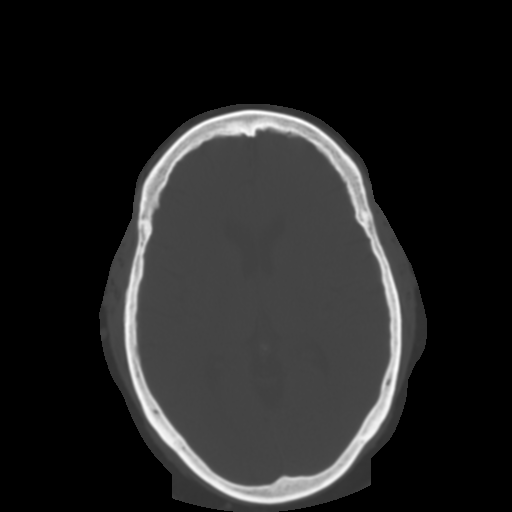
[im 31/44  brain]
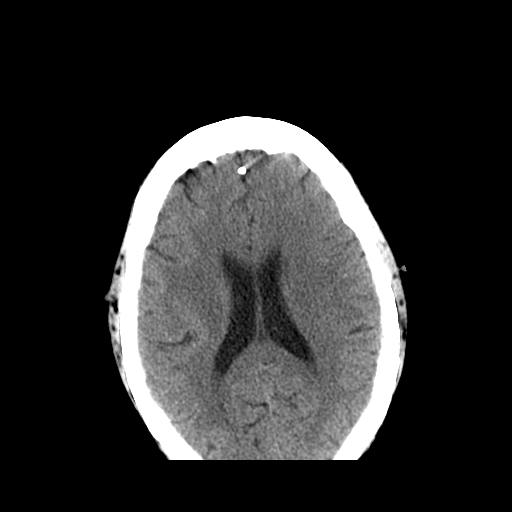
[im 34/44  brain]
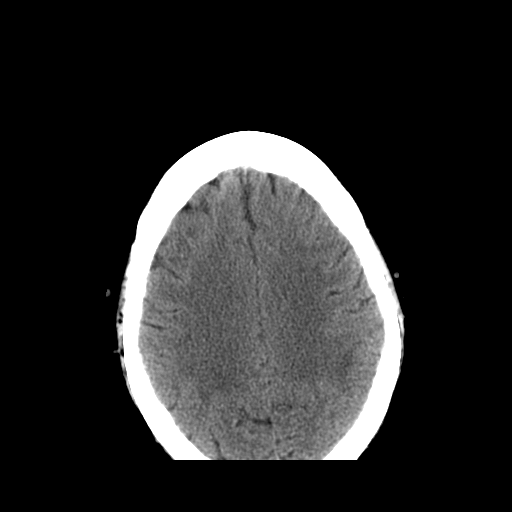
[im 37/44  brain]
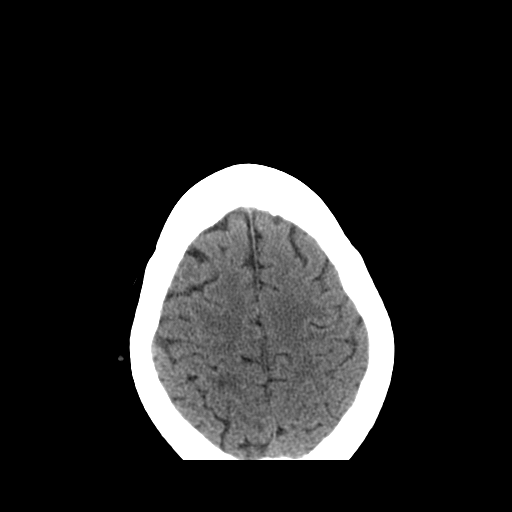
[im 40/44  brain]
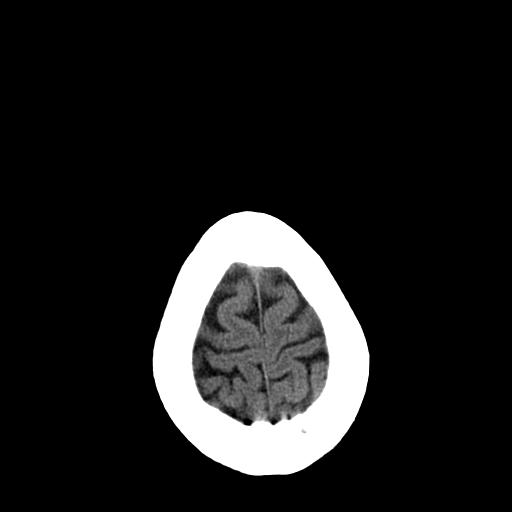
[im 40/44  bone]
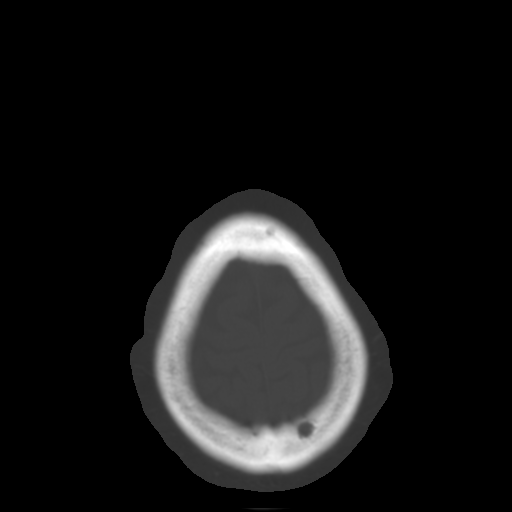

[13 of 30 positions shown; findings below may reference images not displayed]

FINDINGS: Ventricles and sulci are of normal size without evidence of hydrocephalus, midline shift, or intracranial hemorrhage (subarachnoid hemorrhage cannot be excluded by MR).  Major intracranial vascular structures appear patent.  
No acute infarct.  It should be noted that on open magnet with present DWI imaging, only a moderate to large sized infarct can be excluded.  Small infarct cannot be completely excluded given limitation of the present technique.  Scattered nonspecific subcortical white matter-type changes.  This may be related to changes of underlying:  small vessel disease, vasculitis, demyelinating process, inflammatory process, or changes as have been described in patient?s who experience migraine headaches.  Bilateral exophthalmos.  Minimal ethmoid sinus air cell mucosal thickening.  
IMPRESSION
No gross infarct detected with above described limitations. 
Scattered nonspecific white matter-type changes with above described considerations.

## 2004-10-11 IMAGING — MR MR HEAD W/O CM
8 series · 48 of 48 positions shown · non-contrast
Comparison: CT of the head 06/03/03.

CLINICAL DATA: TIAs.  Question infarct.  
MR HEAD WITHOUT CONTRAST (performed in open magnet at [REDACTED] as patient was not able to have closed magnet MRI secondary to habitus).

[Series 5: f11: 3:routine_brain/t1_s · sagittal · 5.0mm · 0.90mm/px · 5 of 17 slices shown]
[im 1/17]
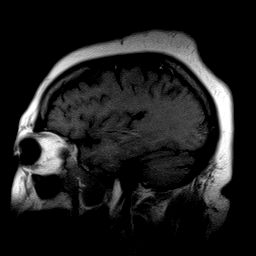
[im 5/17]
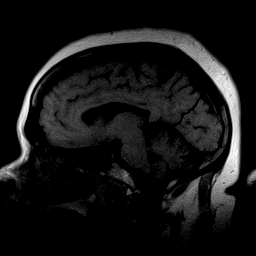
[im 9/17]
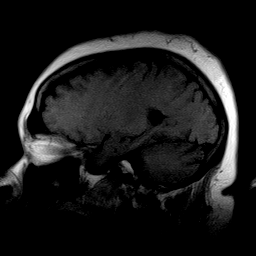
[im 13/17]
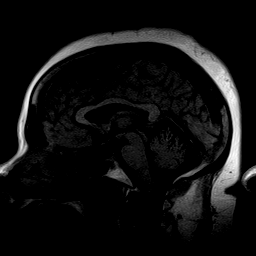
[im 17/17]
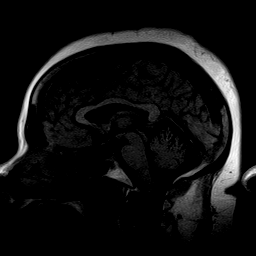

[Series 7: f11: 4:routine_brain/tse_ · axial · 5.0mm · 0.90mm/px · z∈[-68,+58]mm · 6 of 19 slices shown]
[im 1/19]
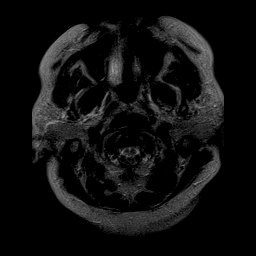
[im 4/19]
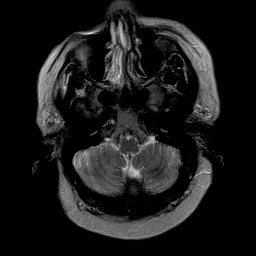
[im 8/19]
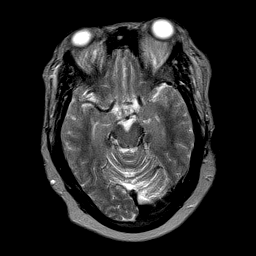
[im 11/19]
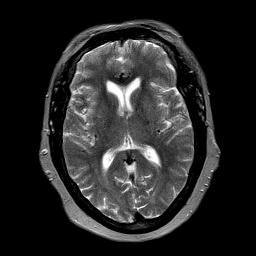
[im 15/19]
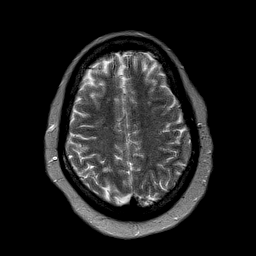
[im 19/19]
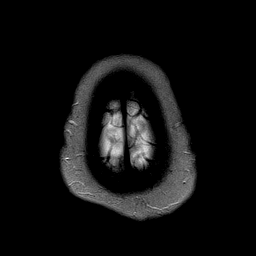

[Series 21: f11: 6:routine_brain/flai · axial · 5.0mm · 0.90mm/px · z∈[-63,+54]mm · 6 of 19 slices shown]
[im 1/19]
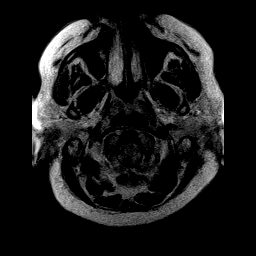
[im 4/19]
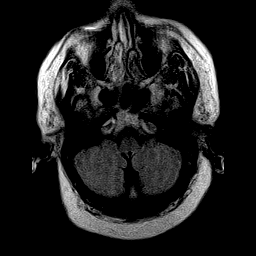
[im 8/19]
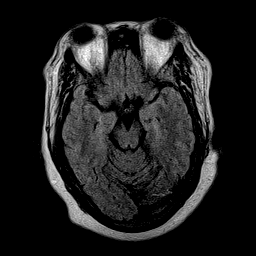
[im 11/19]
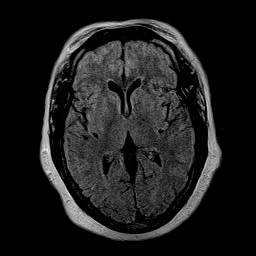
[im 15/19]
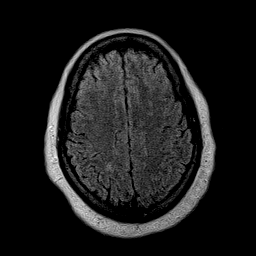
[im 19/19]
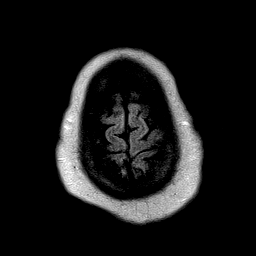

[Series 22: f11: 18:mean_s8_s9_s10_s11 · axial · 10.0mm · 2.03mm/px · z∈[-46,-6]mm · 2 of 5 slices shown]
[im 1/5]
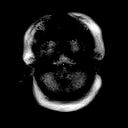
[im 5/5]
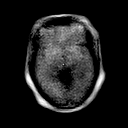

[Series 25: f11: 19:routine_brain/fl3d · axial · 4.0mm · 0.90mm/px · z∈[-65,+59]mm · 10 of 32 slices shown]
[im 1/32]
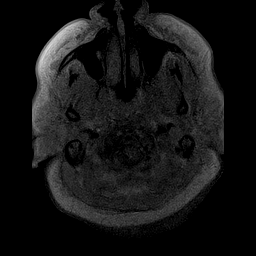
[im 4/32]
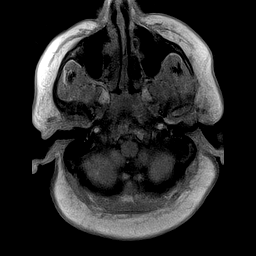
[im 7/32]
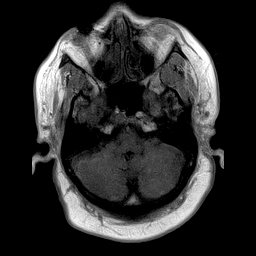
[im 11/32]
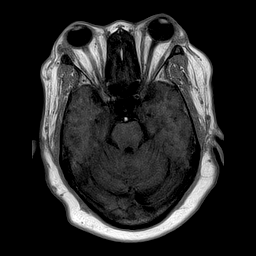
[im 14/32]
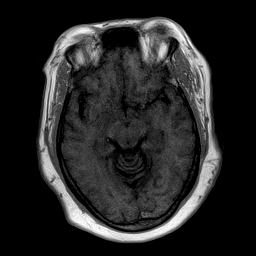
[im 18/32]
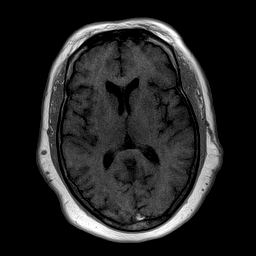
[im 21/32]
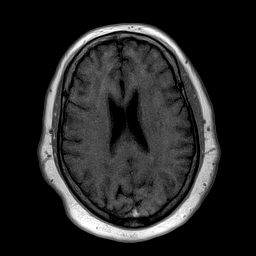
[im 25/32]
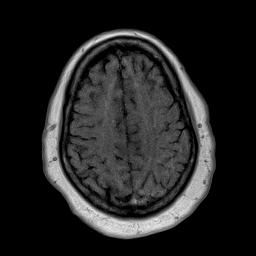
[im 28/32]
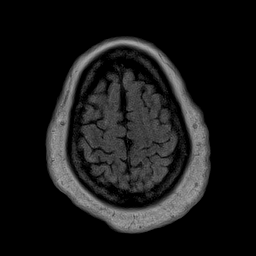
[im 32/32]
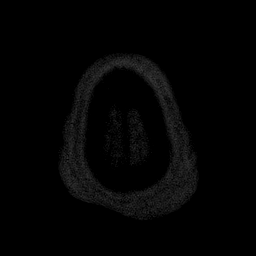

[Series 32: f11: 31:mean_s26_s27_s28_s · axial · 10.0mm · 2.03mm/px · z∈[+4,+44]mm · 2 of 5 slices shown]
[im 1/5]
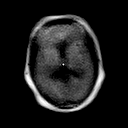
[im 5/5]
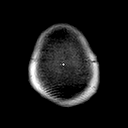

[Series 33: f11: 24:routine_brain/cor_ · coronal · 5.0mm · 0.90mm/px · 14 of 44 slices shown]
[im 1/44]
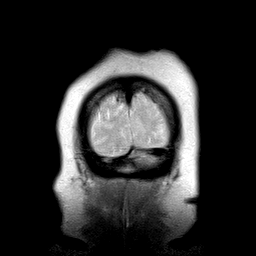
[im 4/44]
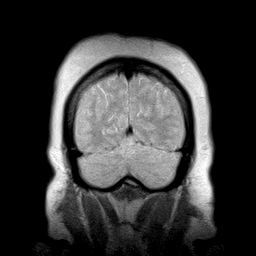
[im 7/44]
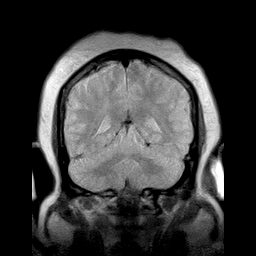
[im 10/44]
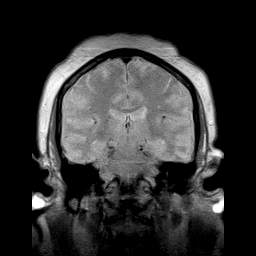
[im 14/44]
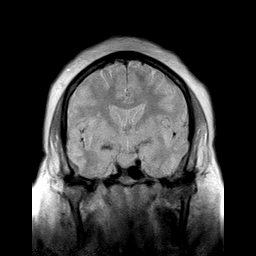
[im 17/44]
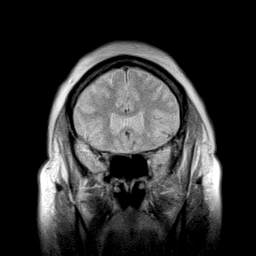
[im 20/44]
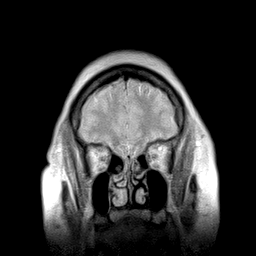
[im 24/44]
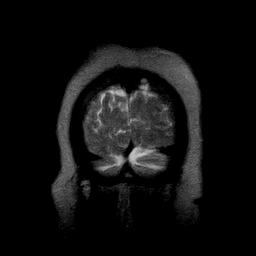
[im 27/44]
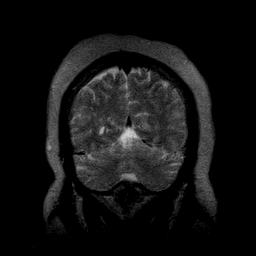
[im 30/44]
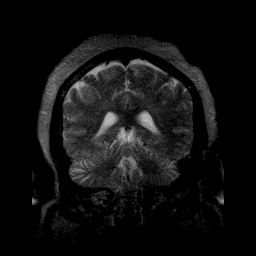
[im 34/44]
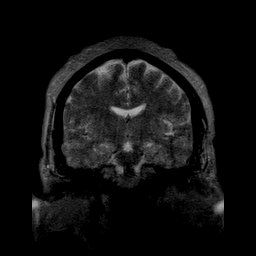
[im 37/44]
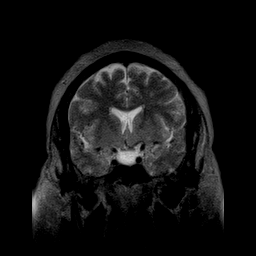
[im 40/44]
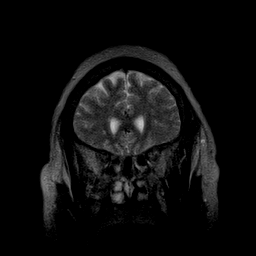
[im 44/44]
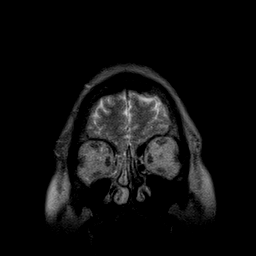

[Series 34: series display · axial · 10.0mm · 1.17mm/px · z∈[+0,+150]mm · 3 of 10 slices shown]
[im 1/10]
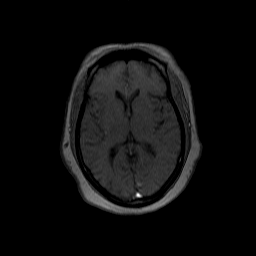
[im 5/10]
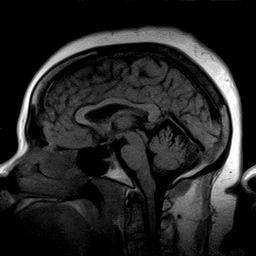
[im 10/10]
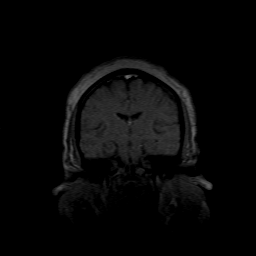

[48 of 48 positions shown; findings below may reference images not displayed]

FINDINGS: Ventricles and sulci are of normal size without evidence of hydrocephalus, midline shift, or intracranial hemorrhage (subarachnoid hemorrhage cannot be excluded by MR).  Major intracranial vascular structures appear patent.  
No acute infarct.  It should be noted that on open magnet with present DWI imaging, only a moderate to large sized infarct can be excluded.  Small infarct cannot be completely excluded given limitation of the present technique.  Scattered nonspecific subcortical white matter-type changes.  This may be related to changes of underlying:  small vessel disease, vasculitis, demyelinating process, inflammatory process, or changes as have been described in patient?s who experience migraine headaches.  Bilateral exophthalmos.  Minimal ethmoid sinus air cell mucosal thickening.  
IMPRESSION
No gross infarct detected with above described limitations. 
Scattered nonspecific white matter-type changes with above described considerations.

## 2004-11-27 ENCOUNTER — Ambulatory Visit: Payer: Self-pay | Admitting: Internal Medicine

## 2004-12-10 ENCOUNTER — Ambulatory Visit (HOSPITAL_COMMUNITY): Admission: RE | Admit: 2004-12-10 | Discharge: 2004-12-10 | Payer: Self-pay | Admitting: Gastroenterology

## 2004-12-19 ENCOUNTER — Ambulatory Visit: Admission: RE | Admit: 2004-12-19 | Discharge: 2004-12-19 | Payer: Self-pay | Admitting: Specialist

## 2004-12-27 ENCOUNTER — Inpatient Hospital Stay (HOSPITAL_COMMUNITY): Admission: EM | Admit: 2004-12-27 | Discharge: 2005-01-07 | Payer: Self-pay | Admitting: Emergency Medicine

## 2004-12-30 ENCOUNTER — Encounter (INDEPENDENT_AMBULATORY_CARE_PROVIDER_SITE_OTHER): Payer: Self-pay | Admitting: Cardiology

## 2004-12-30 ENCOUNTER — Ambulatory Visit: Payer: Self-pay | Admitting: Internal Medicine

## 2005-01-31 IMAGING — CR DG CHEST 1V PORT
1 series · 1 of 1 positions shown · non-contrast
Comparison: none

CLINICAL DATA: 58-year-old with shortness of breath and chest pain.
 PORTABLE ONE VIEW CHEST, 09/24/03. [DATE] HOURS:
 Comparison 06/03/03.
 Cardiac size though accentuated by technique is probably mildly enlarged.  There is pulmonary vascular congestion, but no overt edema.  No focal consolidations are identified.  
 IMPRESSION
 Cardiomegaly and vascular congestion.

[view not recorded]
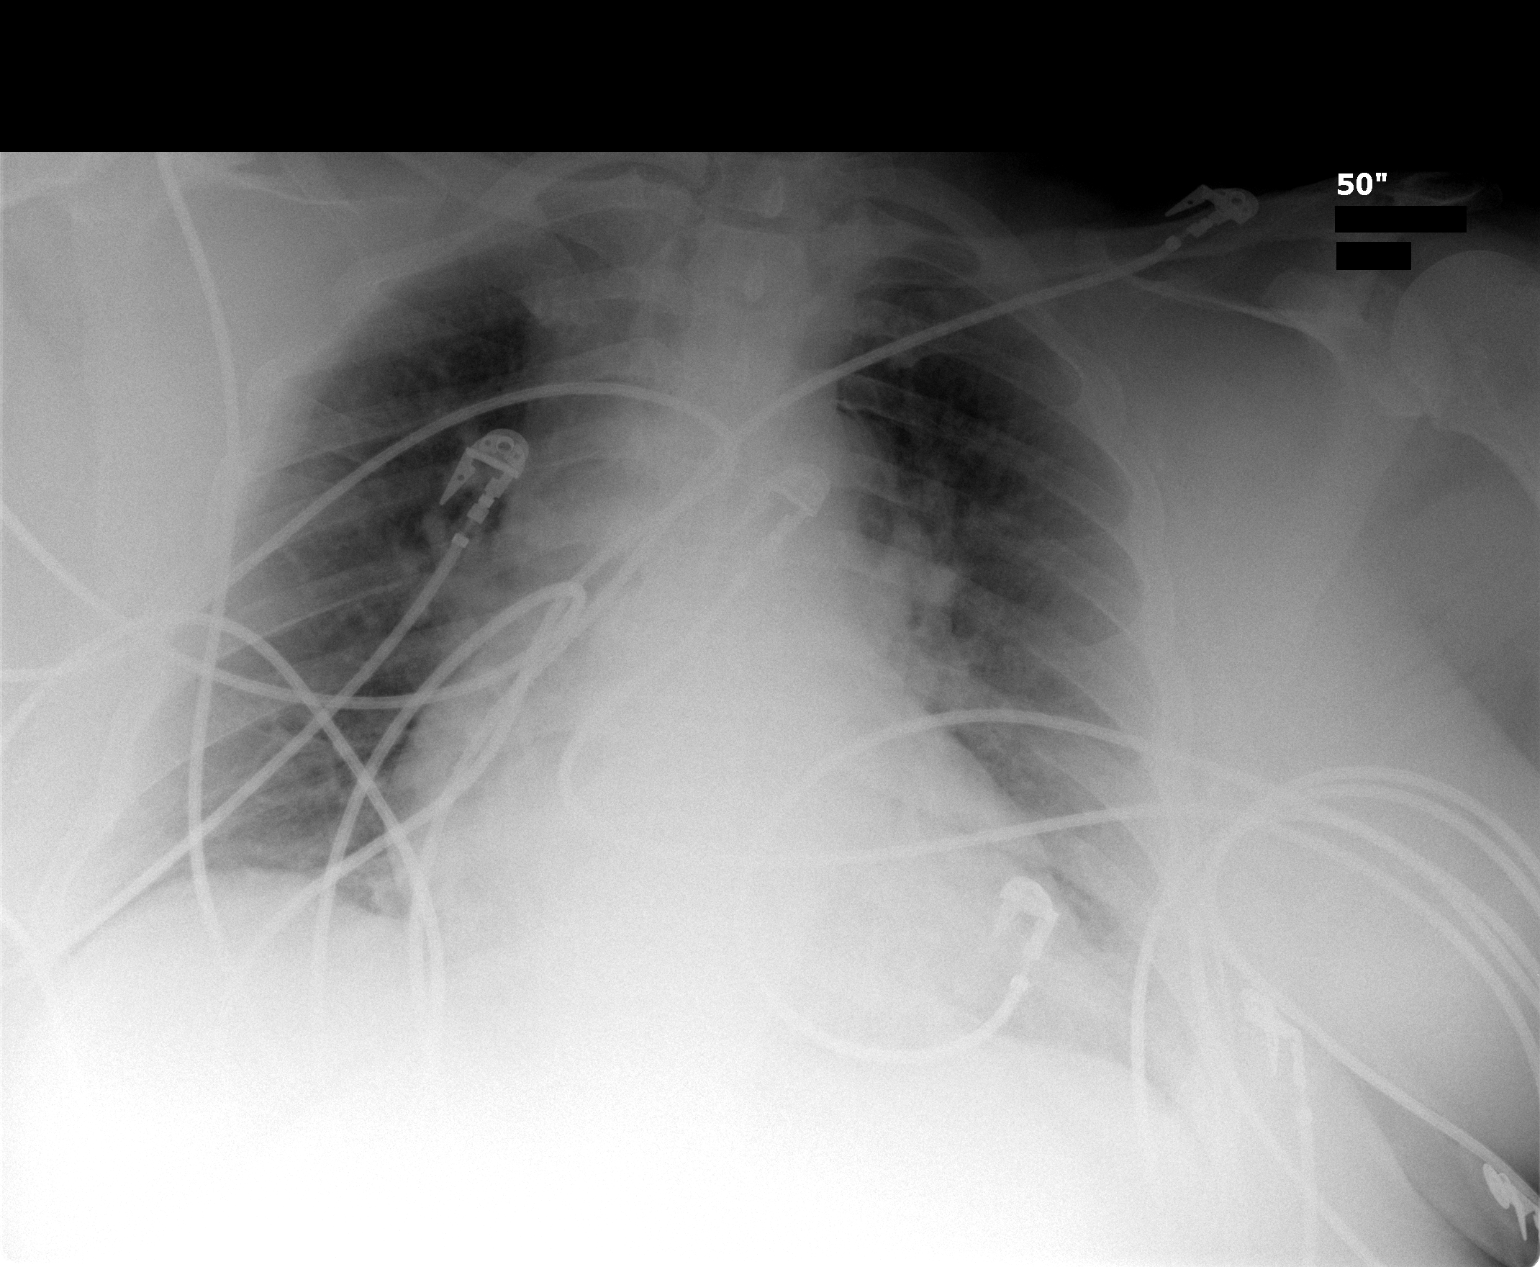

[1 of 1 positions shown; findings below may reference images not displayed]

## 2005-02-01 IMAGING — CR DG CHEST 2V
2 series · 2 of 2 positions shown · non-contrast
Comparison: none

CLINICAL DATA: Chest pain with shortness of breath. 
 PA AND LATERAL CHEST 09/25/03 
 Mild cardiac enlargement.  Basilar subsegmental atelectasis.  Vascular congestion without frank edema.  Mild cardiac enlargement is re-demonstrated with aortic ectasia. 
 IMPRESSION
 Vascular congestion without frank edema.  Basilar subsegmental atelectasis.

[view not recorded (1 of 2)]
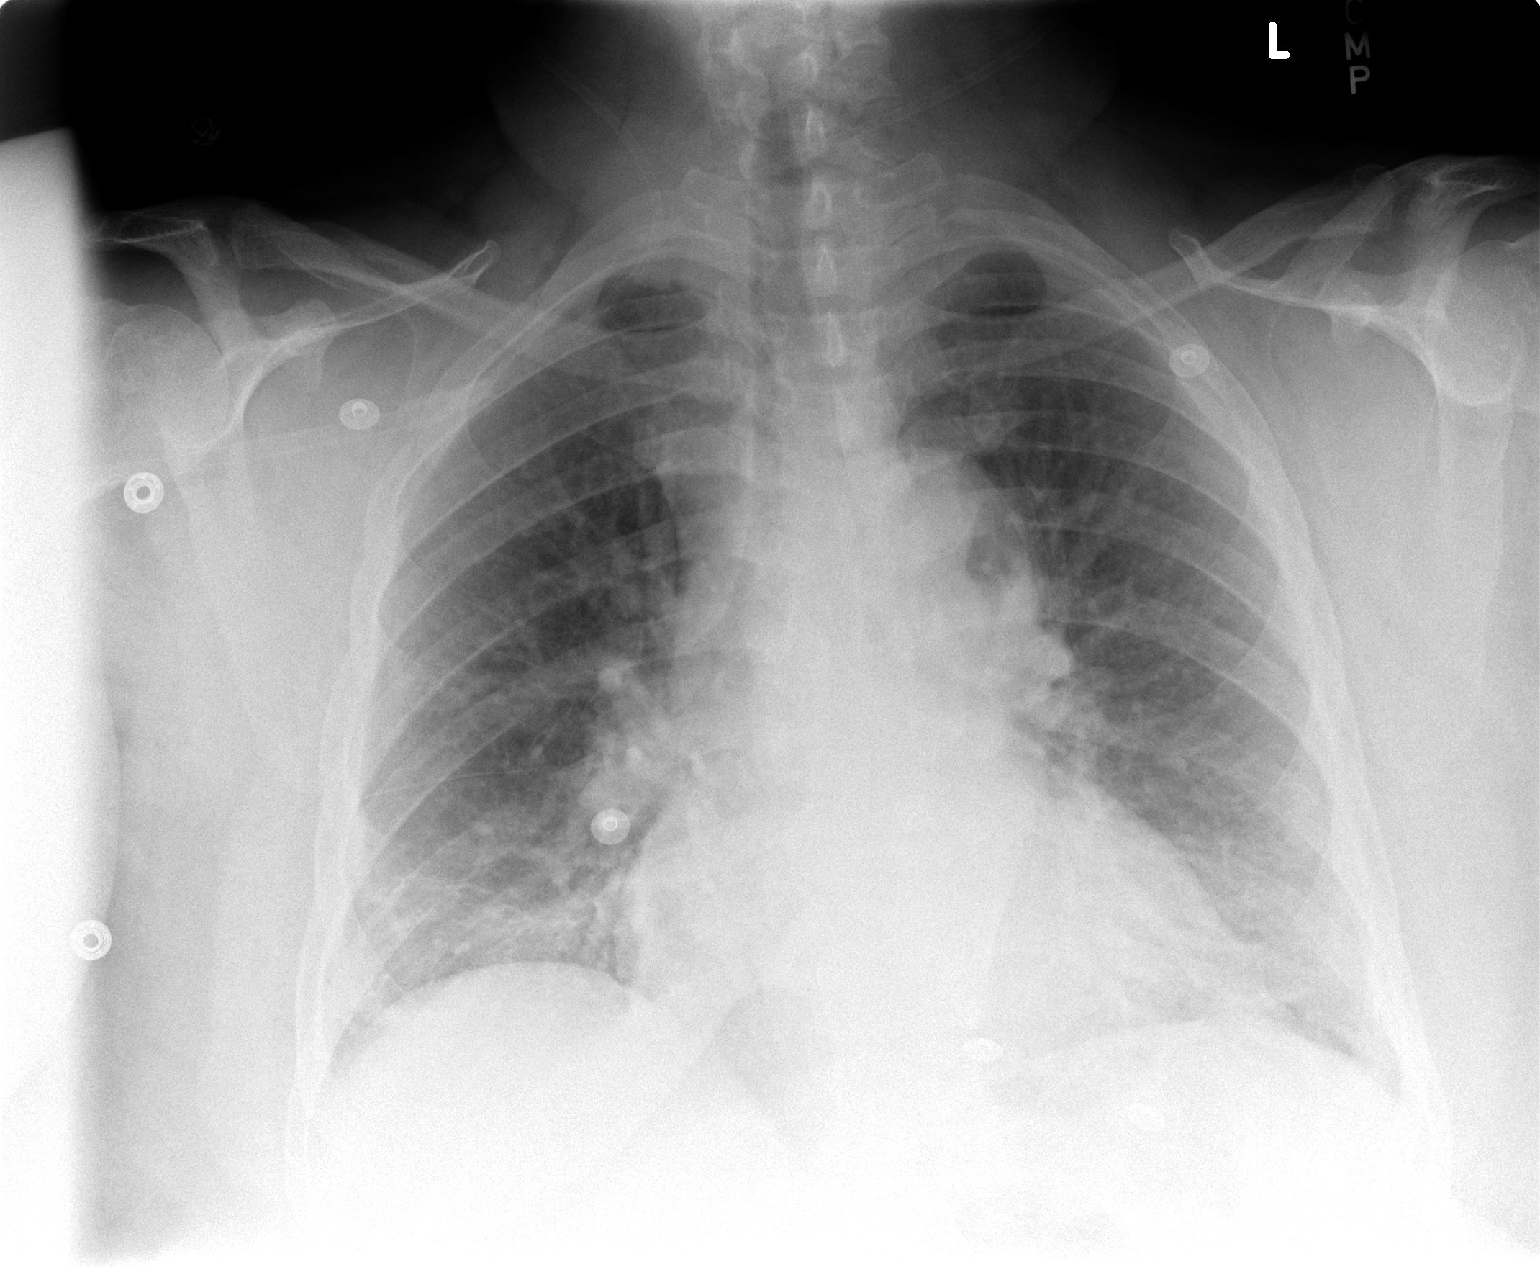

[view not recorded (2 of 2)]
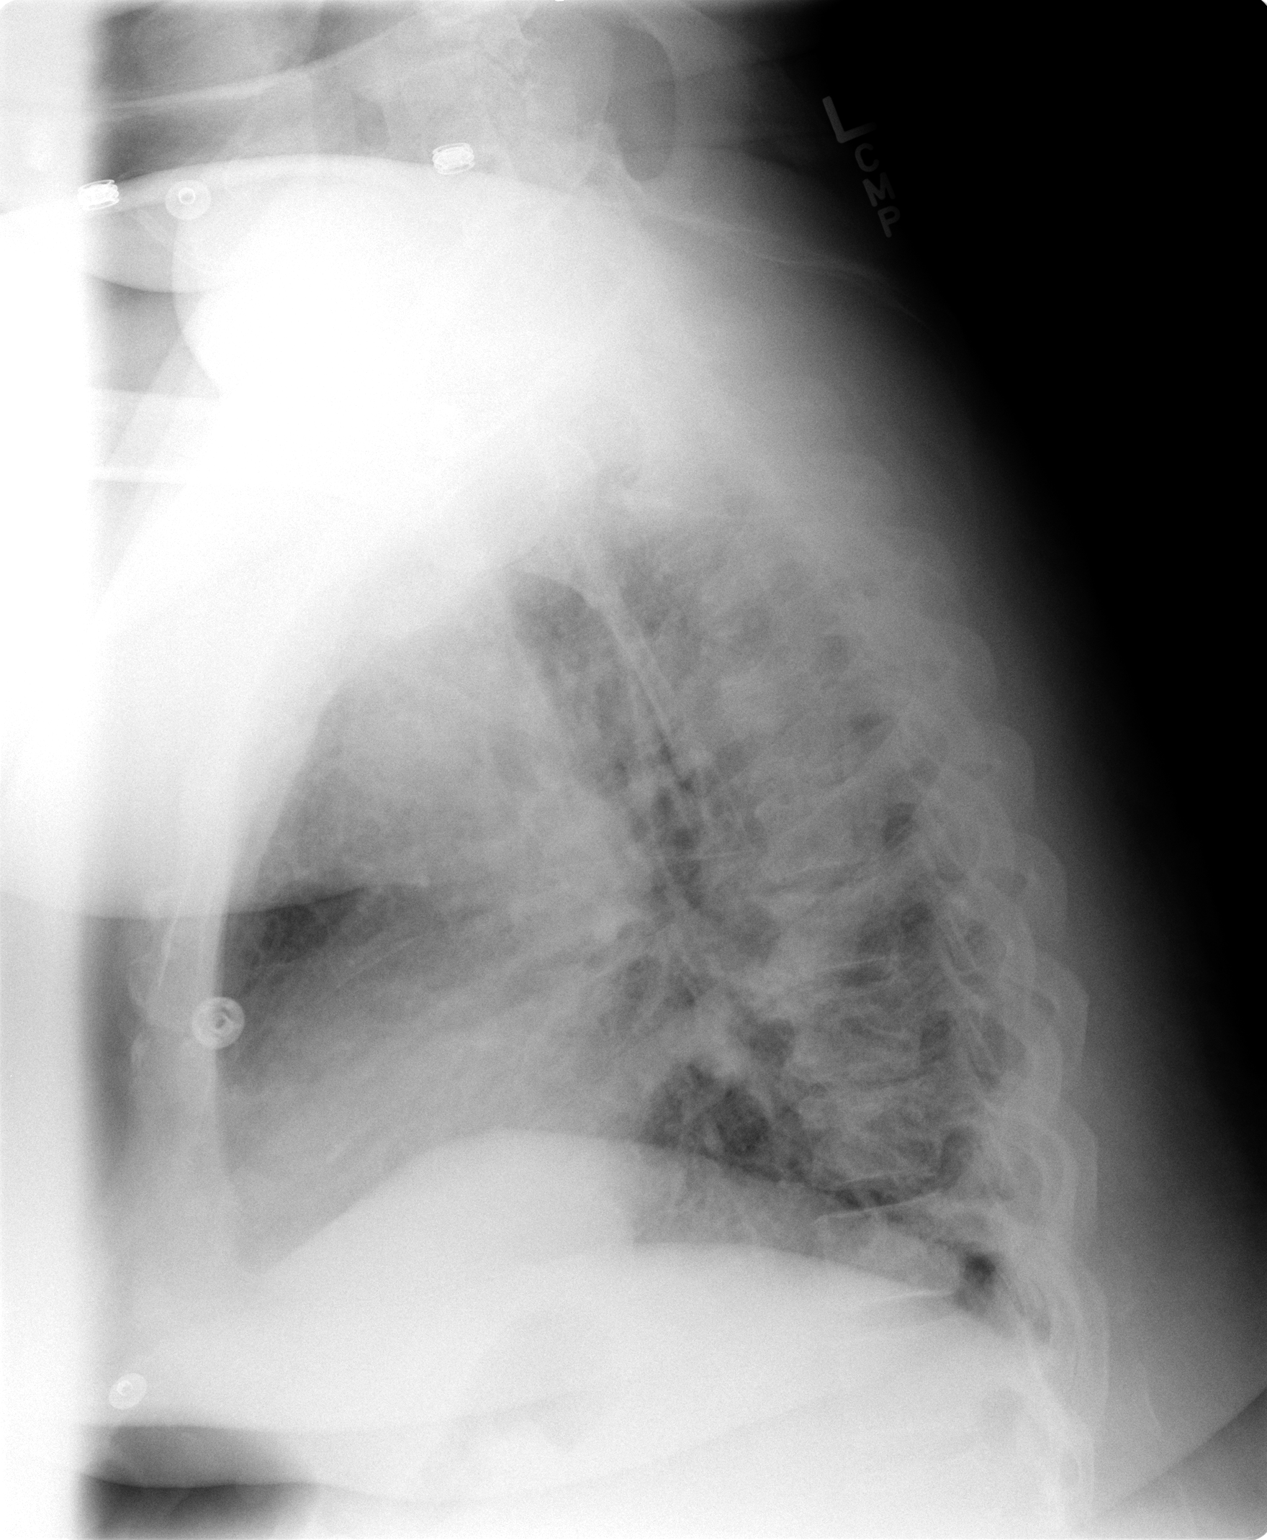

[2 of 2 positions shown; findings below may reference images not displayed]

## 2005-02-01 IMAGING — CT CT ANGIO CHEST
4 of 9 series · 14 of 33 positions shown · IV contrast (120 ML OMNI 300)
Comparison: none

CLINICAL DATA: Dyspnea and chest pain. Question acute pulmonary embolism. 
CT ANGIOGRAPHY OF THE CHEST, WITH CONTRAST
No comparison. 
Multidetector CT imaging of the chest was performed according to the protocol for detection of pulmonary embolism during IV bolus injection of 227cc Omnipaque 300.  Coronal and sagittal plane reformatted images were also generated.    During the injection, pressure limit was reached and the injector terminated the injection prior to administration of the complete contrast bolus.   Clinically, there is no evidence of extravasation into the right arm.  The patient?s right arm was scanned following the exam, and no extravasation was demonstrated. 
Due to the patient?s size and incomplete bolus, there is marginal opacification of the pulmonary arteries.  The patient is diabetic with a serum creatinine of 1.3 mg/dl.  Because of this, repeat injection could not be performed at this time. 
The CTA demonstrates no evidence of emboli within the main or central pulmonary arteries.  Distal assessment is limited.  The thoracic aorta appears normal without evidence of aneurysm or dissection.  There are a few shotty mediastinal lymph nodes, including a 1.2 cm precarinal node.  There is no pleural or pericardial effusion.  The left lobe of the thyroid is mildly enlarged and demonstrates inferior extension into the superior mediastinum.   On the lung windows, linear atelectasis is present at both lung bases. There is no confluent air space opacity or evidence of endobronchial lesion.  The visualized upper abdomen is unremarkable. 
IMPRESSION
Suboptimal study due to the patient?s size and incomplete contrast administration as described above. No central pulmonary emboli are detected. This study is limited for evaluation of distal emboli, and these limitations were discussed by telephone with Dr. Billiot. If the is high clinical suspicion of pulmonary embolism, further evaluation with nuclear medicine scan, ultrasound Doppler of the thighs, or follow-up CT after patient hydration should be considered.

[Series 4: pe w/ lower ext · axial · 0.70mm/px · z∈[-293,-80]mm · 7 of 241 slices shown]
[im 35/241  lung]
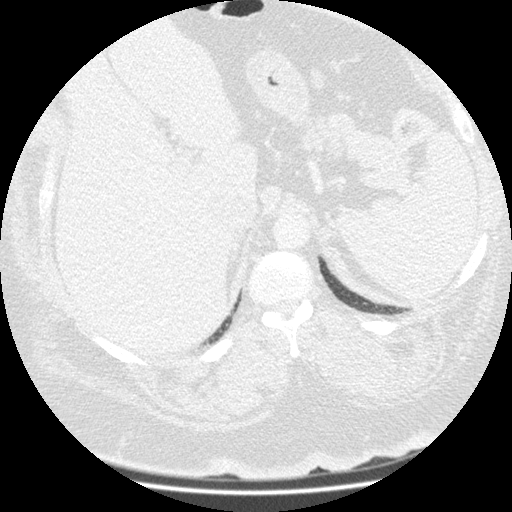
[im 69/241  mediastinal]
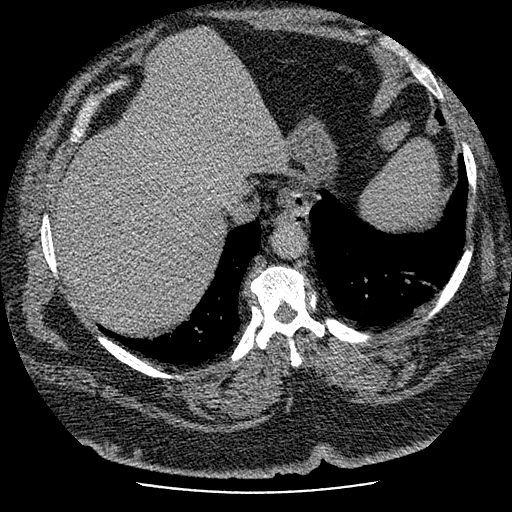
[im 103/241  lung]
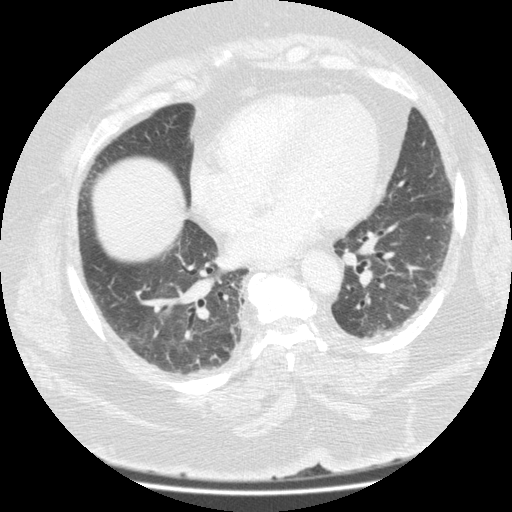
[im 123/241  mediastinal]
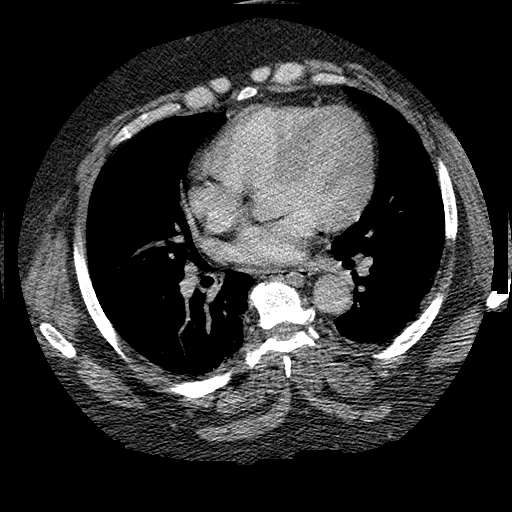
[im 138/241  lung]
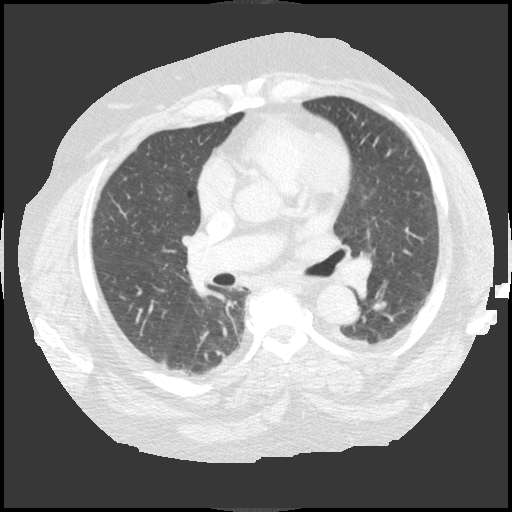
[im 172/241  mediastinal]
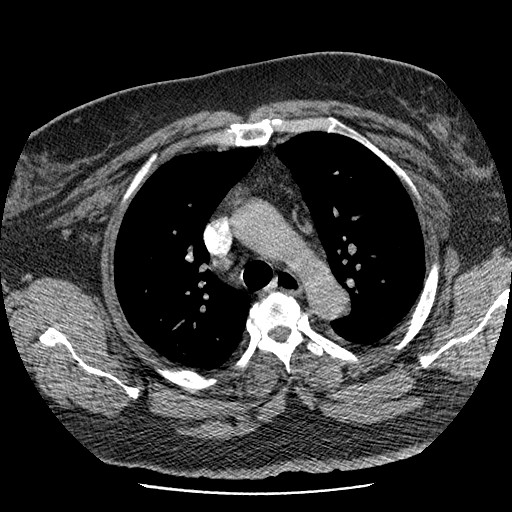
[im 206/241  lung]
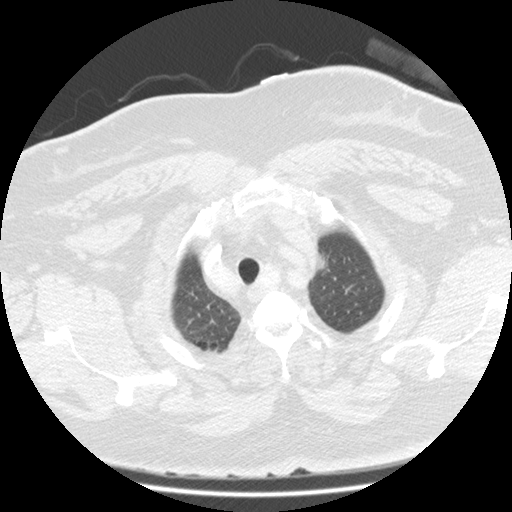

[Series 5: recon 2: pe w/ lower ext · axial · 0.70mm/px · z∈[-236,-136]mm · 3 of 121 slices shown]
[im 41/121  lung]
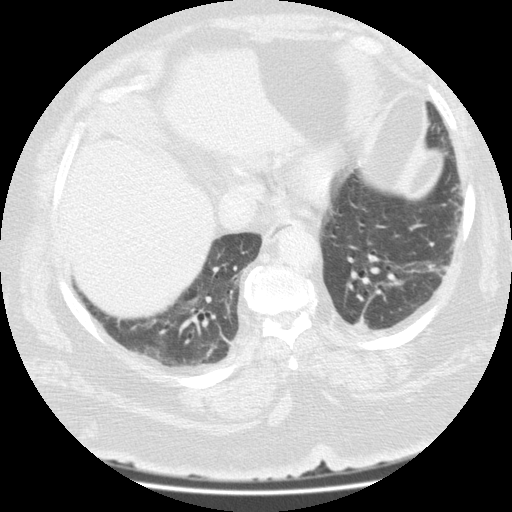
[im 62/121  lung]
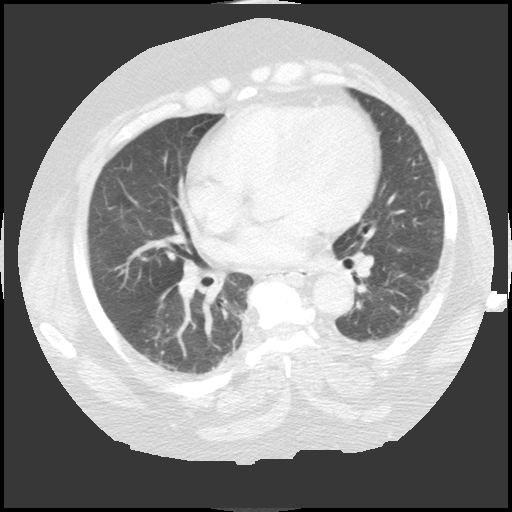
[im 81/121  lung]
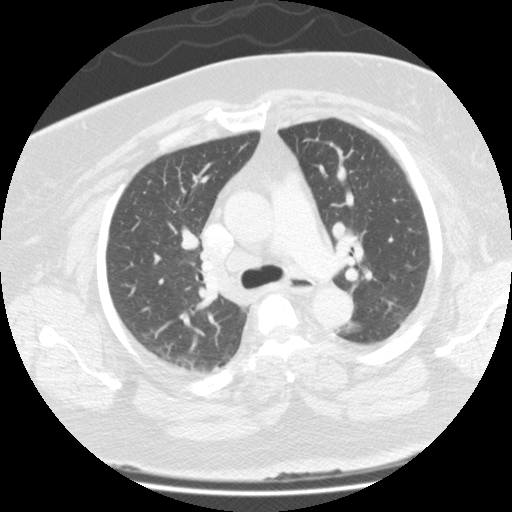

[Series 11: right arn for contrast ? · axial · 0.44mm/px · z∈[-182,-107]mm · 2 of 77 slices shown]
[im 10/77  lung]
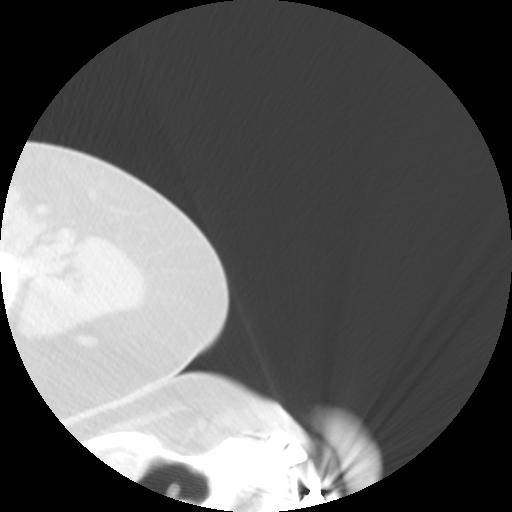
[im 39/77  lung]
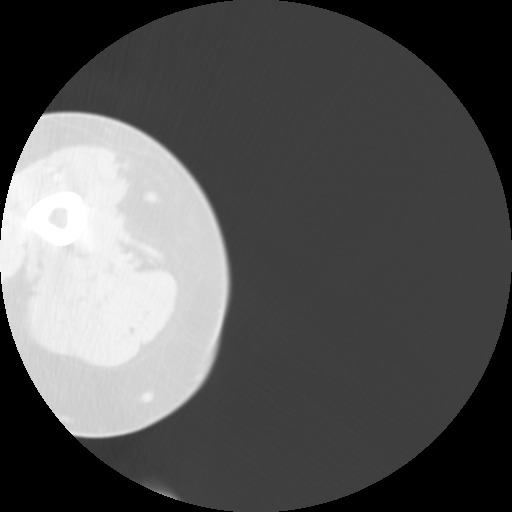

[Series 400: reformatted · sagittal · 0.49mm/px · 2 of 108 slices shown]
[im 36/108  lung]
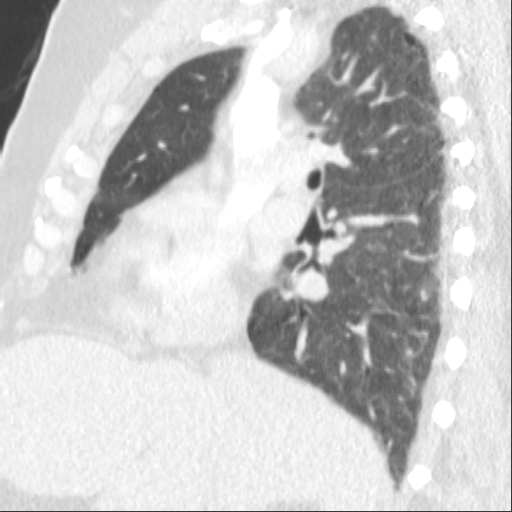
[im 72/108  lung]
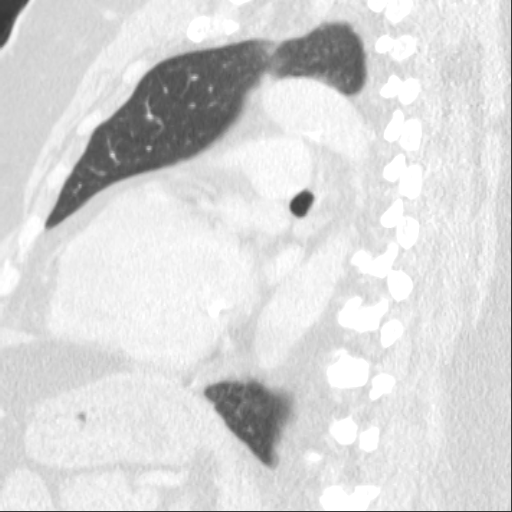

[14 of 33 positions shown; findings below may reference images not displayed]

## 2005-02-02 IMAGING — CT CT ANGIO CHEST
1 of 5 series · 11 of 30 positions shown · IV contrast (OMNI 300 [ID])
Comparison: none

CLINICAL DATA: Short of breath with left chest pain. 
 CT ANGIO CHEST WITH CONTRAST
 Multidetector CT imaging of the chest was performed according to the protocol for detection of pulmonary embolism during IV bolus injection of 120 cc Omnipaque 300.  Coronal and sagittal plane reformatted images were also generated.  
 No definite pulmonary emboli are identified in any of the 3 planes.  CT cannot reliably exclude small peripheral emboli, especially in a patient of this large body habitus since there is considerable amount of noise on the images.  Nonetheless, the main right and left pulmonary arteries and their first and second order divisions show no obvious filling defects. 
 There is subsegmental atelectasis or atelectatic pneumonia of both bases - right greater than left.  There are also small pleural effusions.  These findings were not present yesterday. 
 No pneumothorax.
 Thoracic aorta is also noted to be normal.   
 The left lower aspect of the thyroid gland shows enlargement with what is likely to be a large dominant nodule measuring about 2.5 cm in size.  Recommend clinical correlation.  Consider thyroid ultrasound for further assessment.

[Series 3: pe · axial · 0.76mm/px · z∈[-263,-20]mm · 11 of 238 slices shown]
[im 22/238  lung]
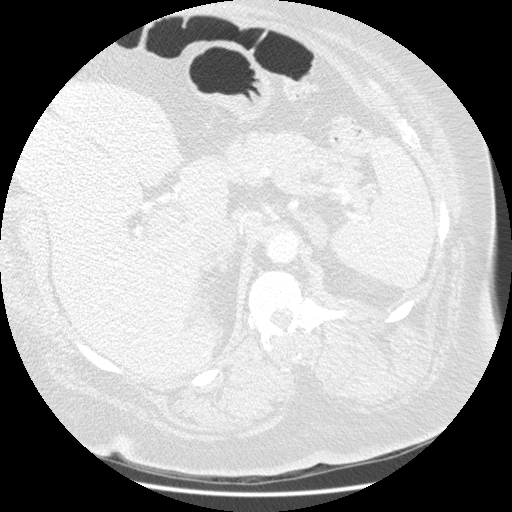
[im 44/238  mediastinal]
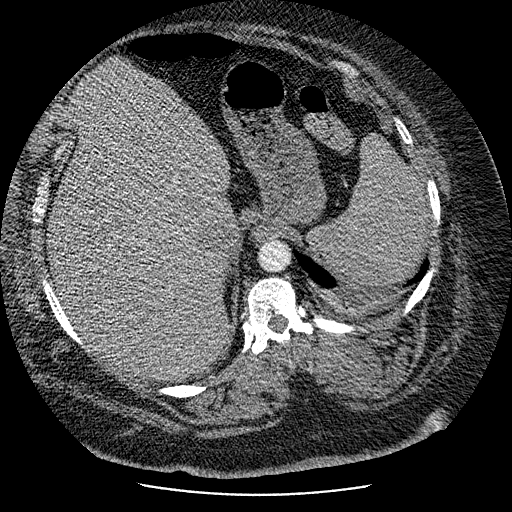
[im 65/238  lung]
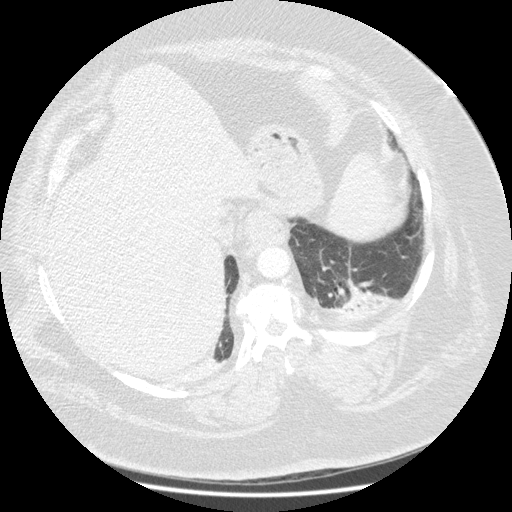
[im 87/238  mediastinal]
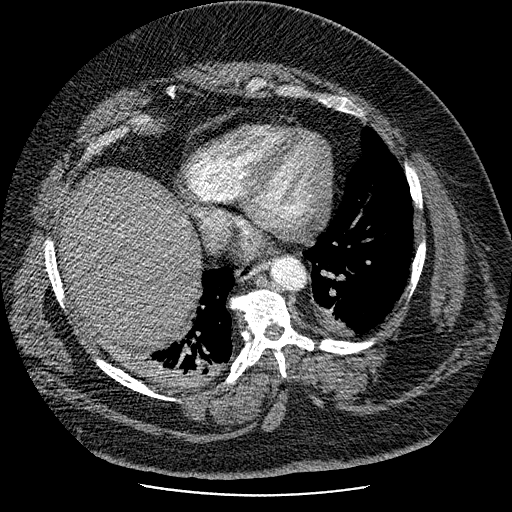
[im 108/238  lung]
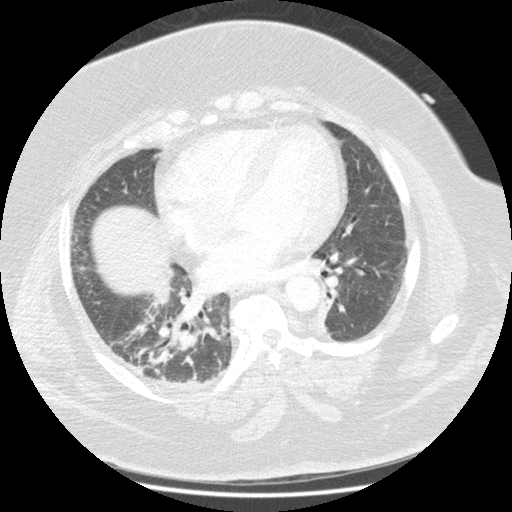
[im 112/238  mediastinal]
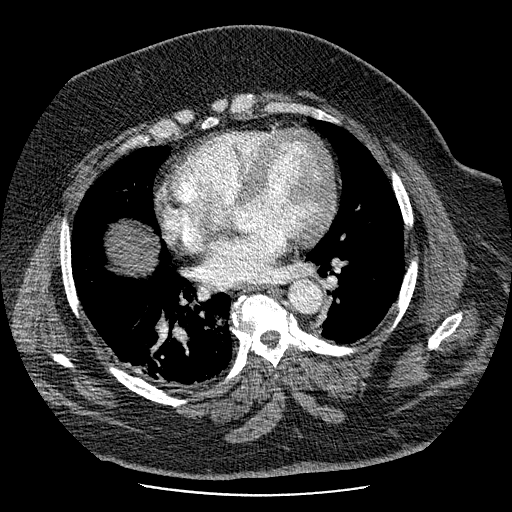
[im 130/238  lung]
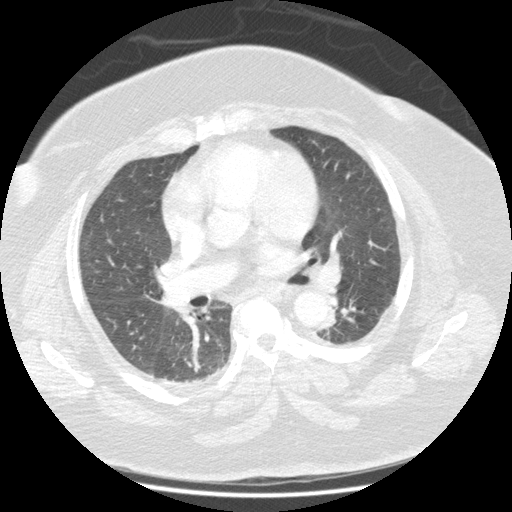
[im 151/238  mediastinal]
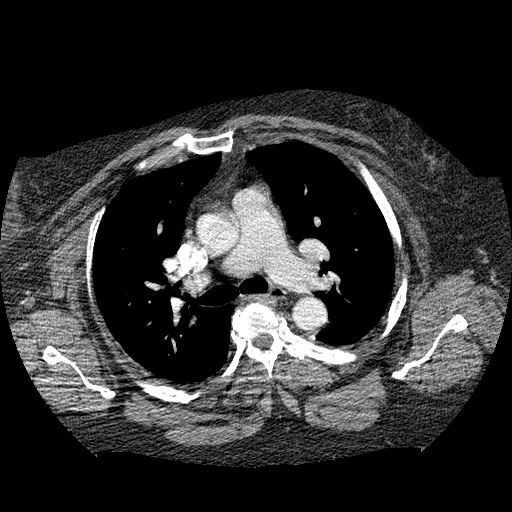
[im 173/238  lung]
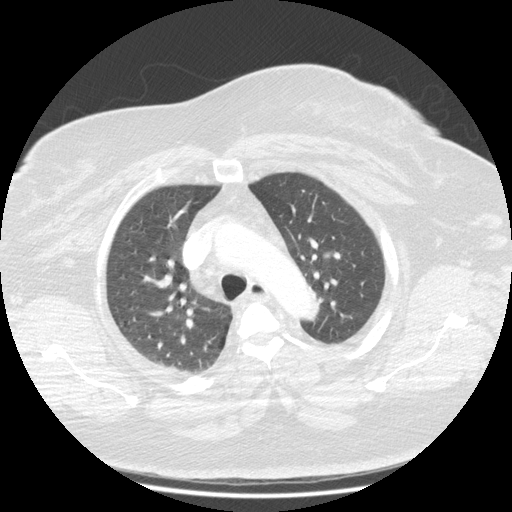
[im 194/238  mediastinal]
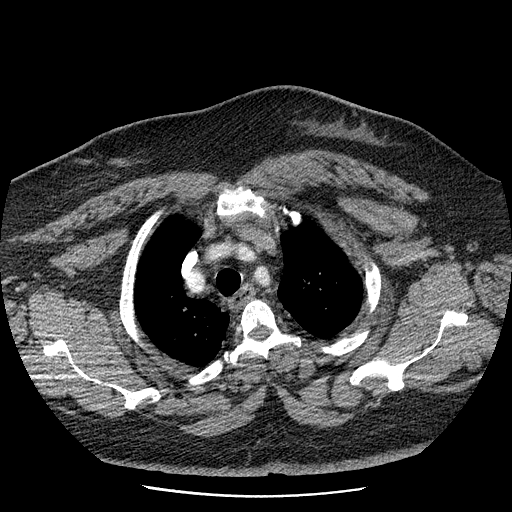
[im 216/238  lung]
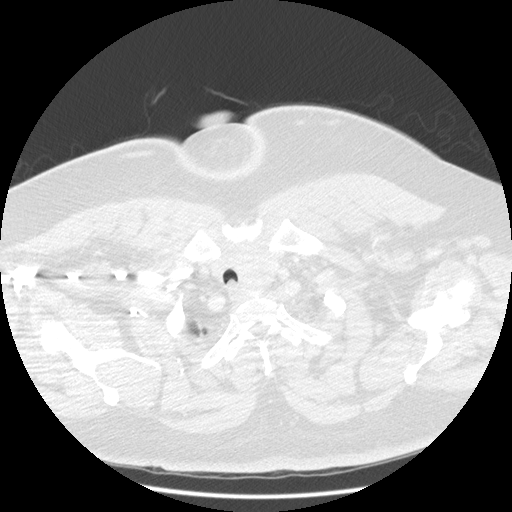

[11 of 30 positions shown; findings below may reference images not displayed]

IMPRESSION: 1.  No pulmonary emboli are demonstrated. 
 2.  Bilateral lower lobe subsegmental atelectasis/pneumonia with pleural effusion - right greater than left.  New since yesterday's exam. 
 3.  No evidence for thoracic aneurysm. 
 4.  Left thyroid mass.

## 2005-02-03 IMAGING — CT CT ABDOMEN W/O CM
1 of 2 series · 13 of 32 positions shown, 18 images · non-contrast
Comparison: Unenhanced CT abdomen and pelvis [HOSPITAL] 04/28/02.

CLINICAL DATA: Right lower quadrant abdominal pain, history of hernia repair.  Patient now presents with significant drop in hemoglobin.  
CT OF THE ABDOMEN AND PELVIS ? WITHOUT CONTRAST
TECHNIQUE: Neither intravenous nor oral contrast were administered.  Helical CT through the abdomen and pelvis was performed at 5 mm collimations.

[Series 2: routine abdomen · axial · 0.98mm/px · z∈[-510,-66]mm · 13 of 101 slices shown, 18 images]
[im 6/101  soft-tissue]
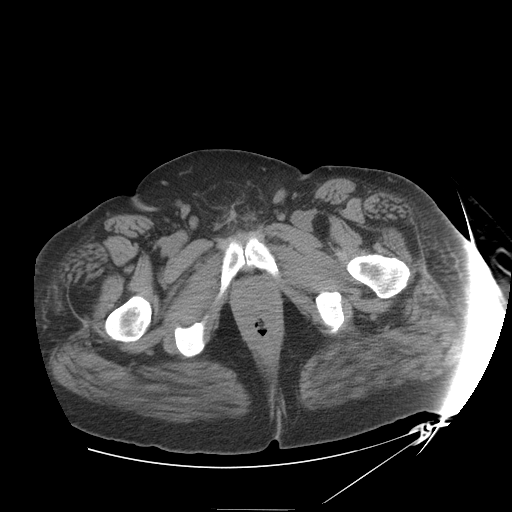
[im 6/101  bone]
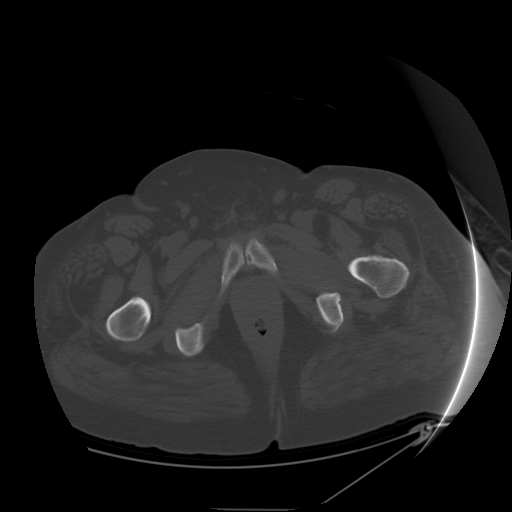
[im 17/101  soft-tissue]
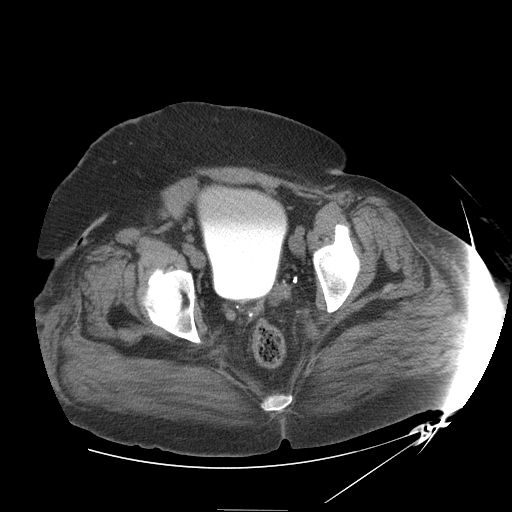
[im 23/101  soft-tissue]
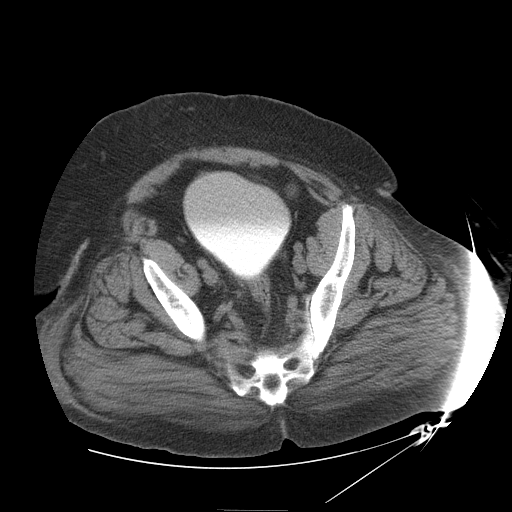
[im 28/101  soft-tissue]
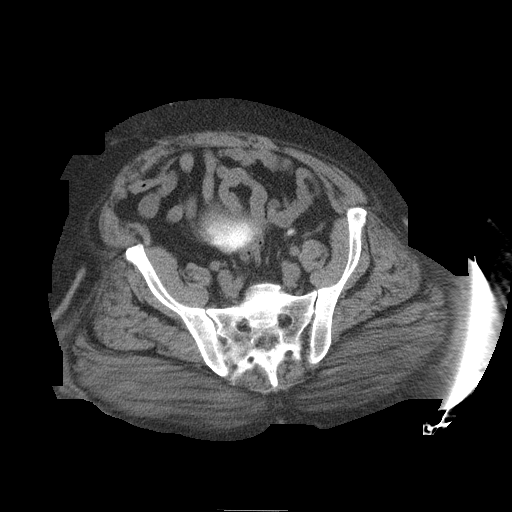
[im 39/101  soft-tissue]
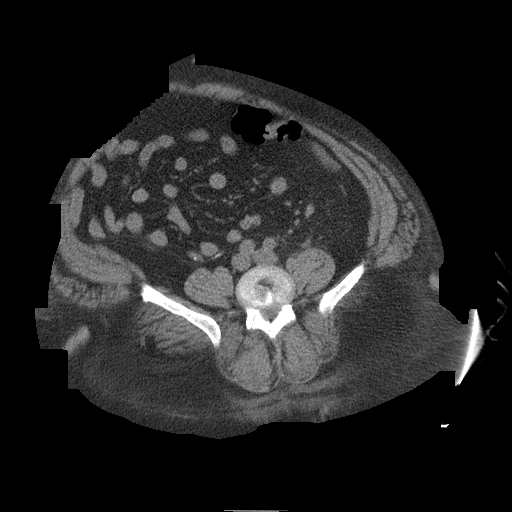
[im 45/101  soft-tissue]
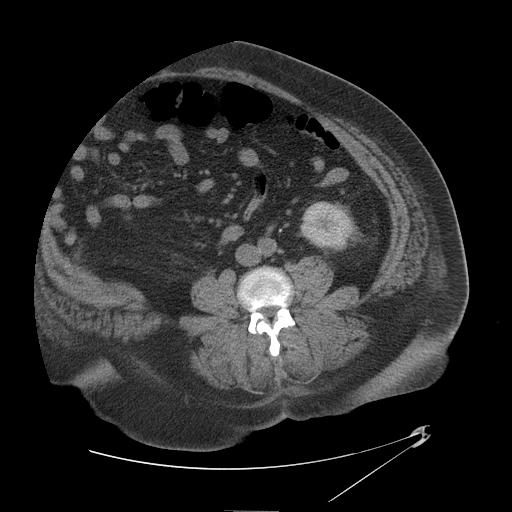
[im 56/101  soft-tissue]
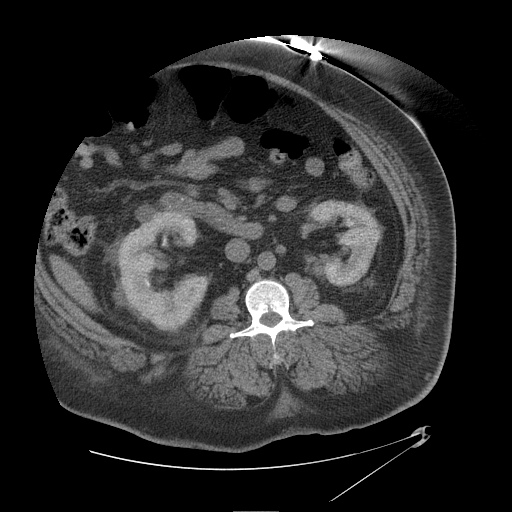
[im 62/101  soft-tissue]
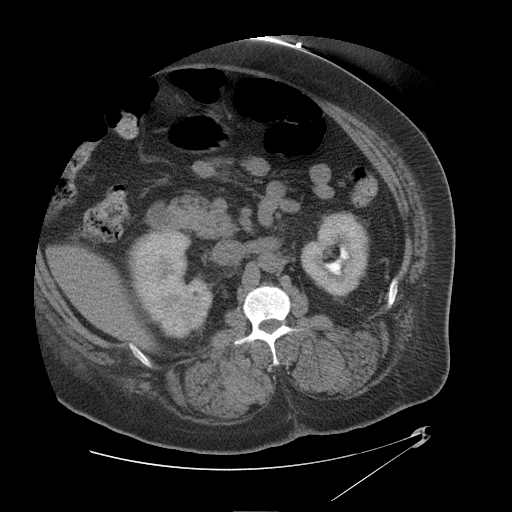
[im 73/101  soft-tissue]
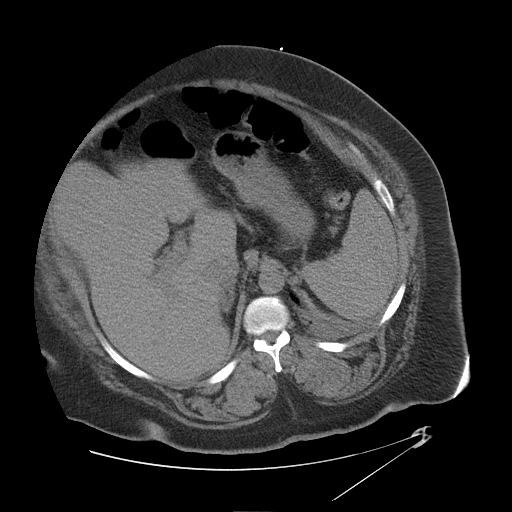
[im 73/101  bone]
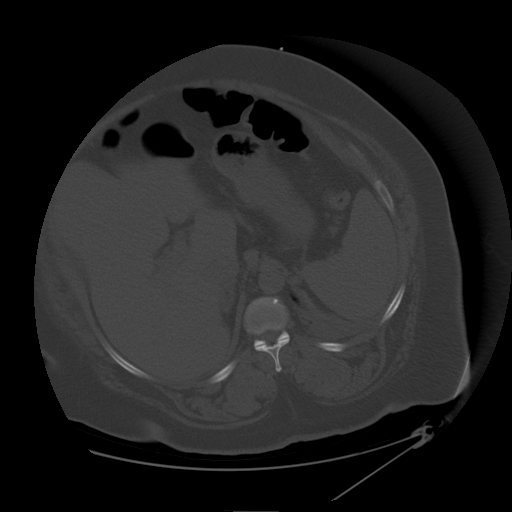
[im 78/101  soft-tissue]
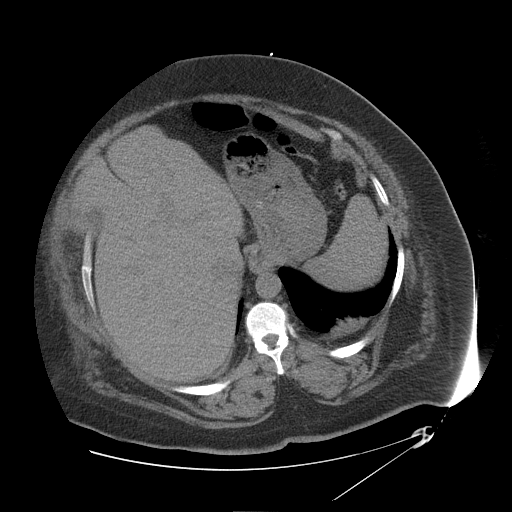
[im 78/101  lung]
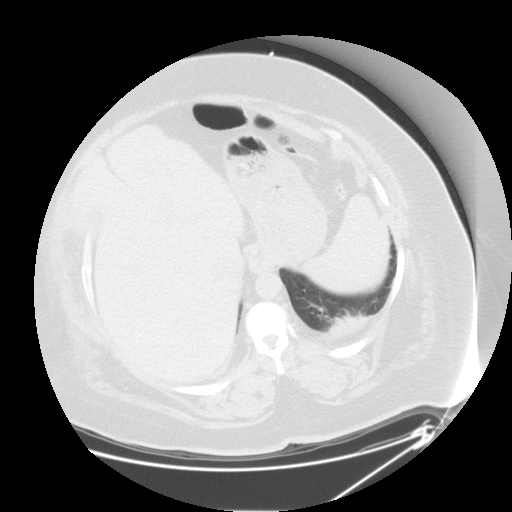
[im 84/101  soft-tissue]
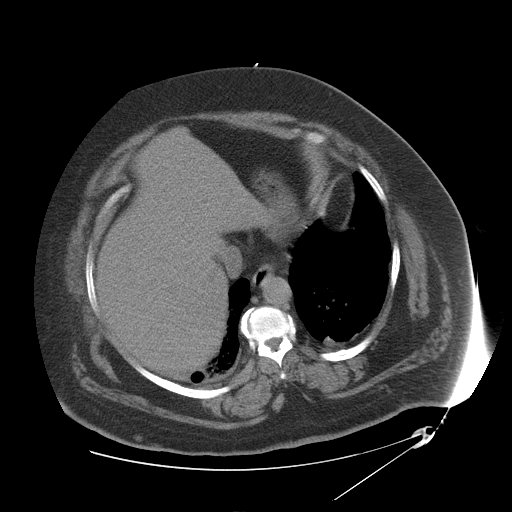
[im 84/101  lung]
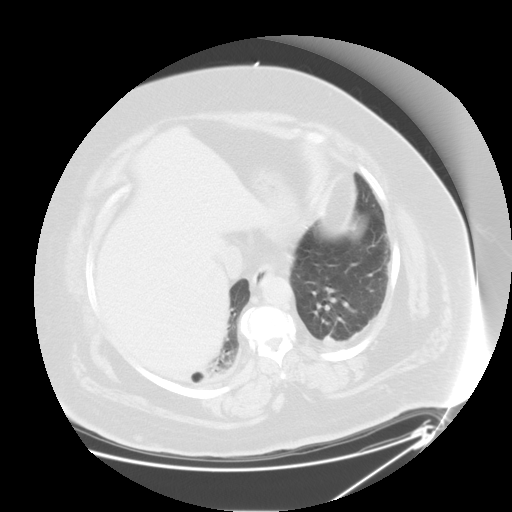
[im 89/101  lung]
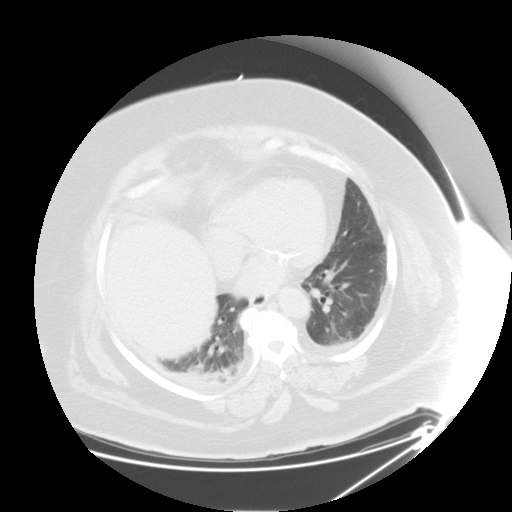
[im 95/101  soft-tissue]
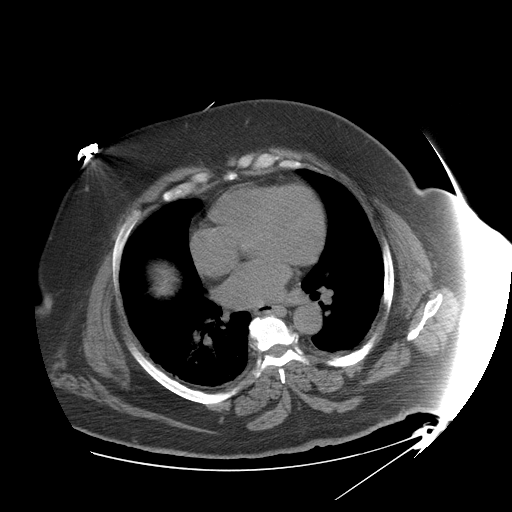
[im 95/101  lung]
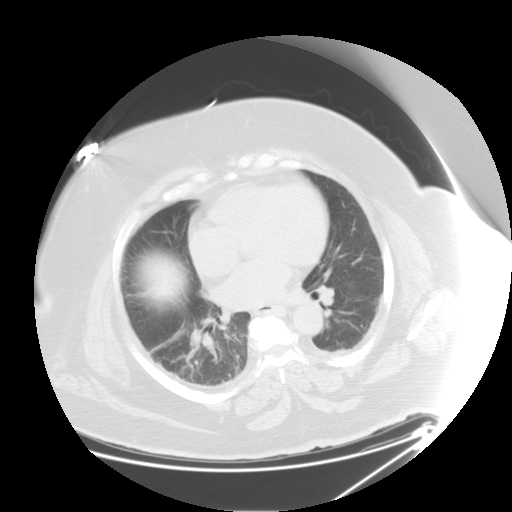

[13 of 32 positions shown; findings below may reference images not displayed]

CT ABDOMEN 
There is no evidence of retroperitoneal or intraperitoneal hemorrhage or hematoma.  There is residual contrast material in the collecting systems of both kidneys without evidence of hydronephrosis.  There is also residual contrast within the renal parenchyma.  The patient had a CT angio chest yesterday.  The fact that there is residual contrast material in the kidneys would suggest the possibility of renal disease.  This is also suggested by the fact that there is perinephric fluid and edema which was not present on the previous examination.  The ureters are normal in appearance.  There is an exophytic simple cyst arising from the anterior mid right kidney which is unchanged.  
Within the limits of the unenhanced technique, the liver, spleen, and pancreas are unremarkable.  A small adenoma involving the lateral limb of the right adrenal gland is again noted and is unchanged. Left adrenal gland is unremarkable.  The gallbladder is unremarkable by CT and there is no biliary ductal dilatation.  Small hiatal hernia is present and is unchanged.  The large and small bowel are unremarkable in the upper abdomen.  Very mild aortic atherosclerosis is present.  Atelectasis is present in the lower lobes bilaterally, left greater than right. There is mild pleural thickening on the left, predominately due to subpleural fat. 
IMPRESSION
1.  No evidence of retroperitoneal or intraperitoneal hematoma.  
2.  Residual contrast material in the renal parenchyma and in the collecting systems from the CTA chest yesterday. This is abnormal and would suggest medical renal disease.  This is further confirmed by the fact that there is perinephric edema and fluid bilaterally.  Clinical correlation.  There is no hydronephrosis.
3.  No acute abnormalities in the abdomen otherwise. 
4.  Small hiatal hernia. 

CT PELVIS 
There is no evidence of retroperitoneal or intraperitoneal hematoma.  Contrast material is present in the bladder which is normal in appearance.  The uterus is either markedly atrophic or is surgically absent. The colon and the small bowel are unremarkable. 
IMPRESSION
No acute abnormalities in the pelvis.

## 2005-02-04 IMAGING — CR DG CHEST 1V PORT
1 series · 1 of 1 positions shown · non-contrast
Comparison: 09/25/03.

CLINICAL DATA: Chest pain.  Dyspnea. 
 PORTABLE CHEST, 09/28/03

[view not recorded]
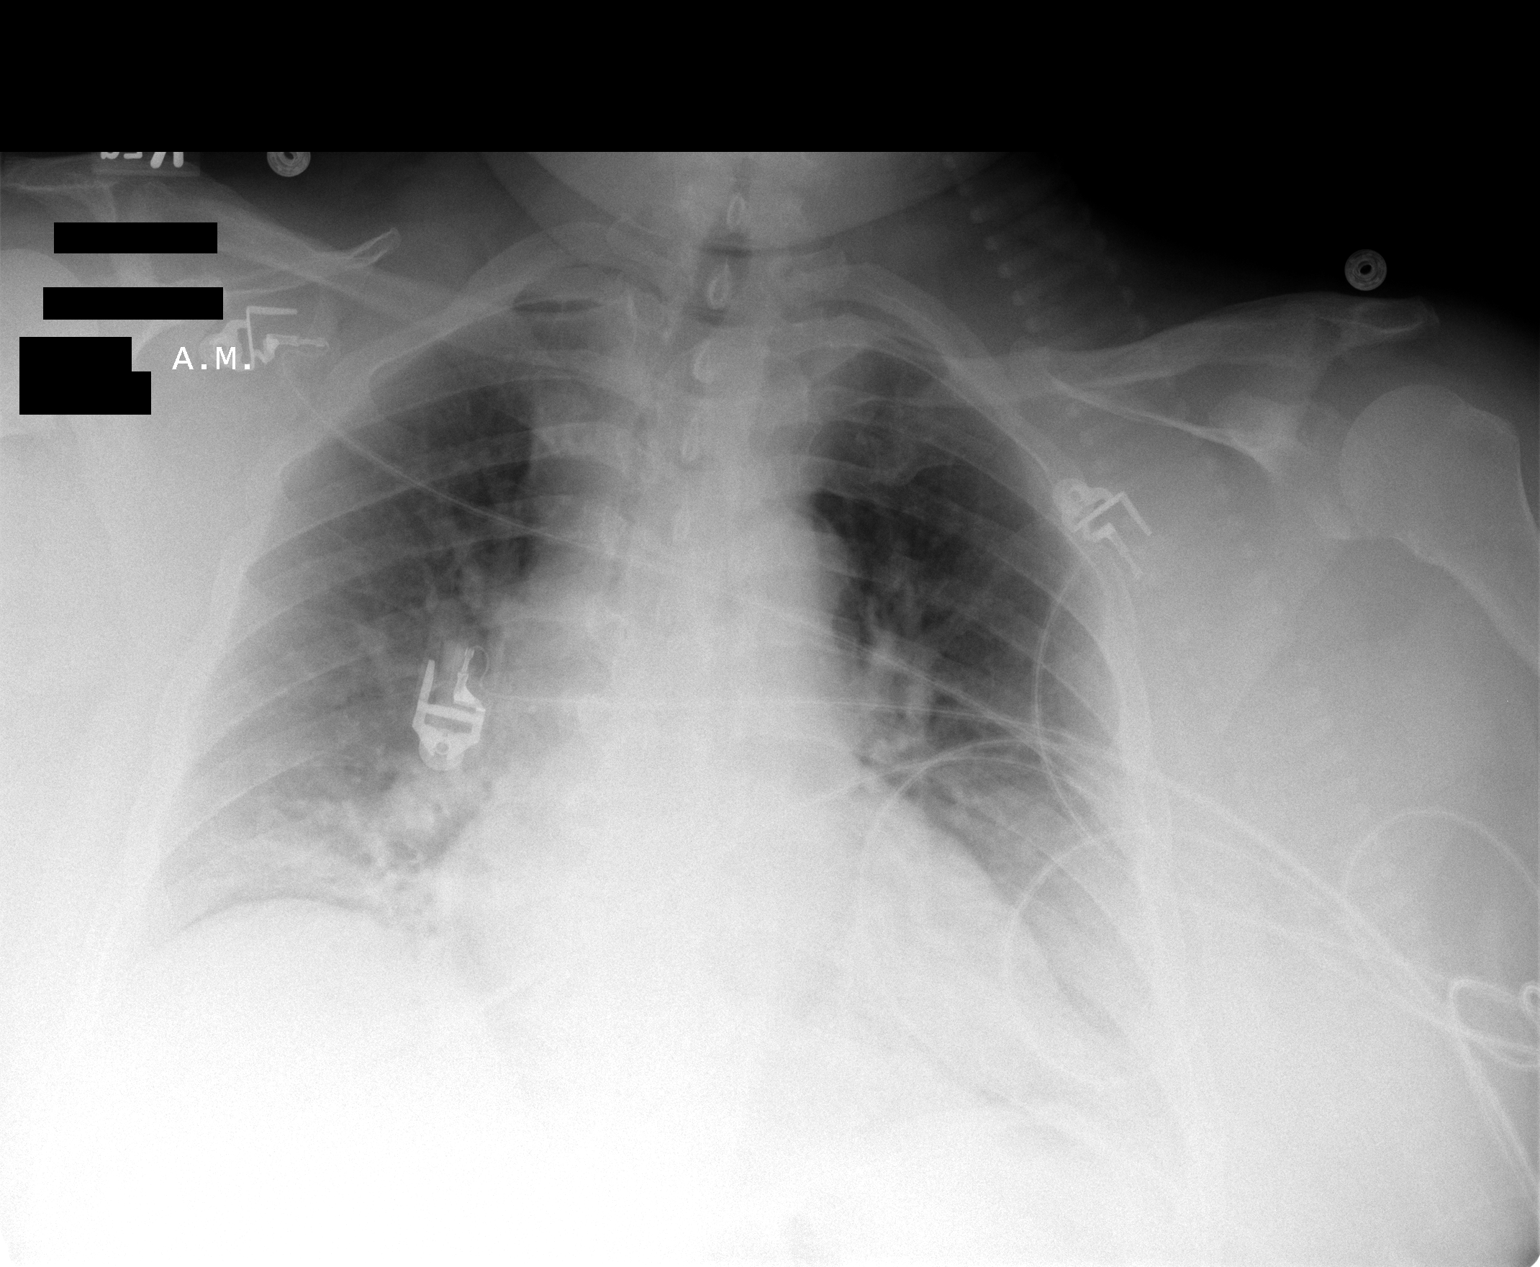

[1 of 1 positions shown; findings below may reference images not displayed]

FINDINGS: Slightly decreased lung volumes.  Bibasilar atelectasis/airspace disease again noted, as well as cardiomegaly, vascular congestion, and probable mild interstitial edema.   No significant change otherwise.
IMPRESSION: Slight decreased lung volumes.  Otherwise unchanged cardiovascular, vascular congestion, bilateral lower lung atelectasis/airspace disease, and probable interstitial edema.

## 2005-02-05 IMAGING — CR DG CHEST 2V
2 series · 2 of 2 positions shown · non-contrast
Comparison: none

CLINICAL DATA: Chest pain. 
 AP AND LATERAL CHEST, 09/29/03
 Compare chest 09/28/03.
 Cardiac enlargement with biventricular configuration.  Vascular congestion without frank edema.  Negative for effusions.  
 IMPRESSION
 Cardiac enlargement with vascular congestion.

[view not recorded (1 of 2)]
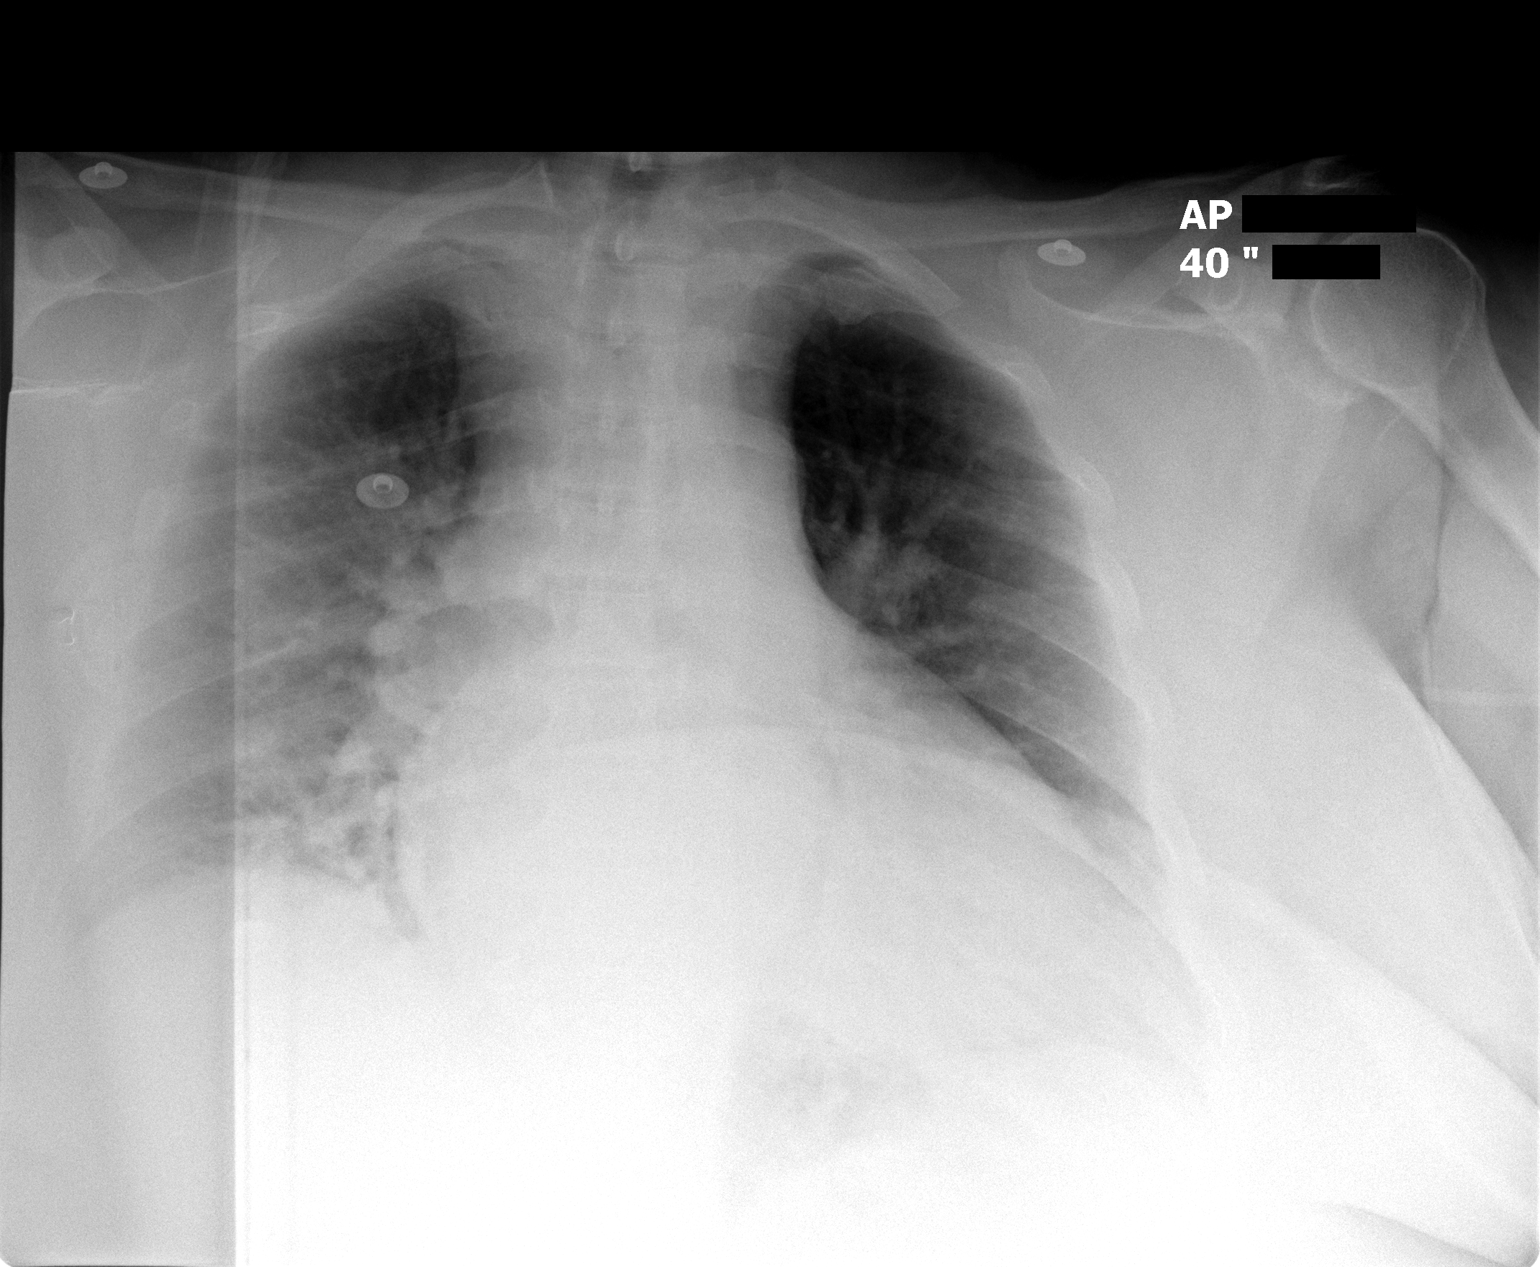

[view not recorded (2 of 2)]
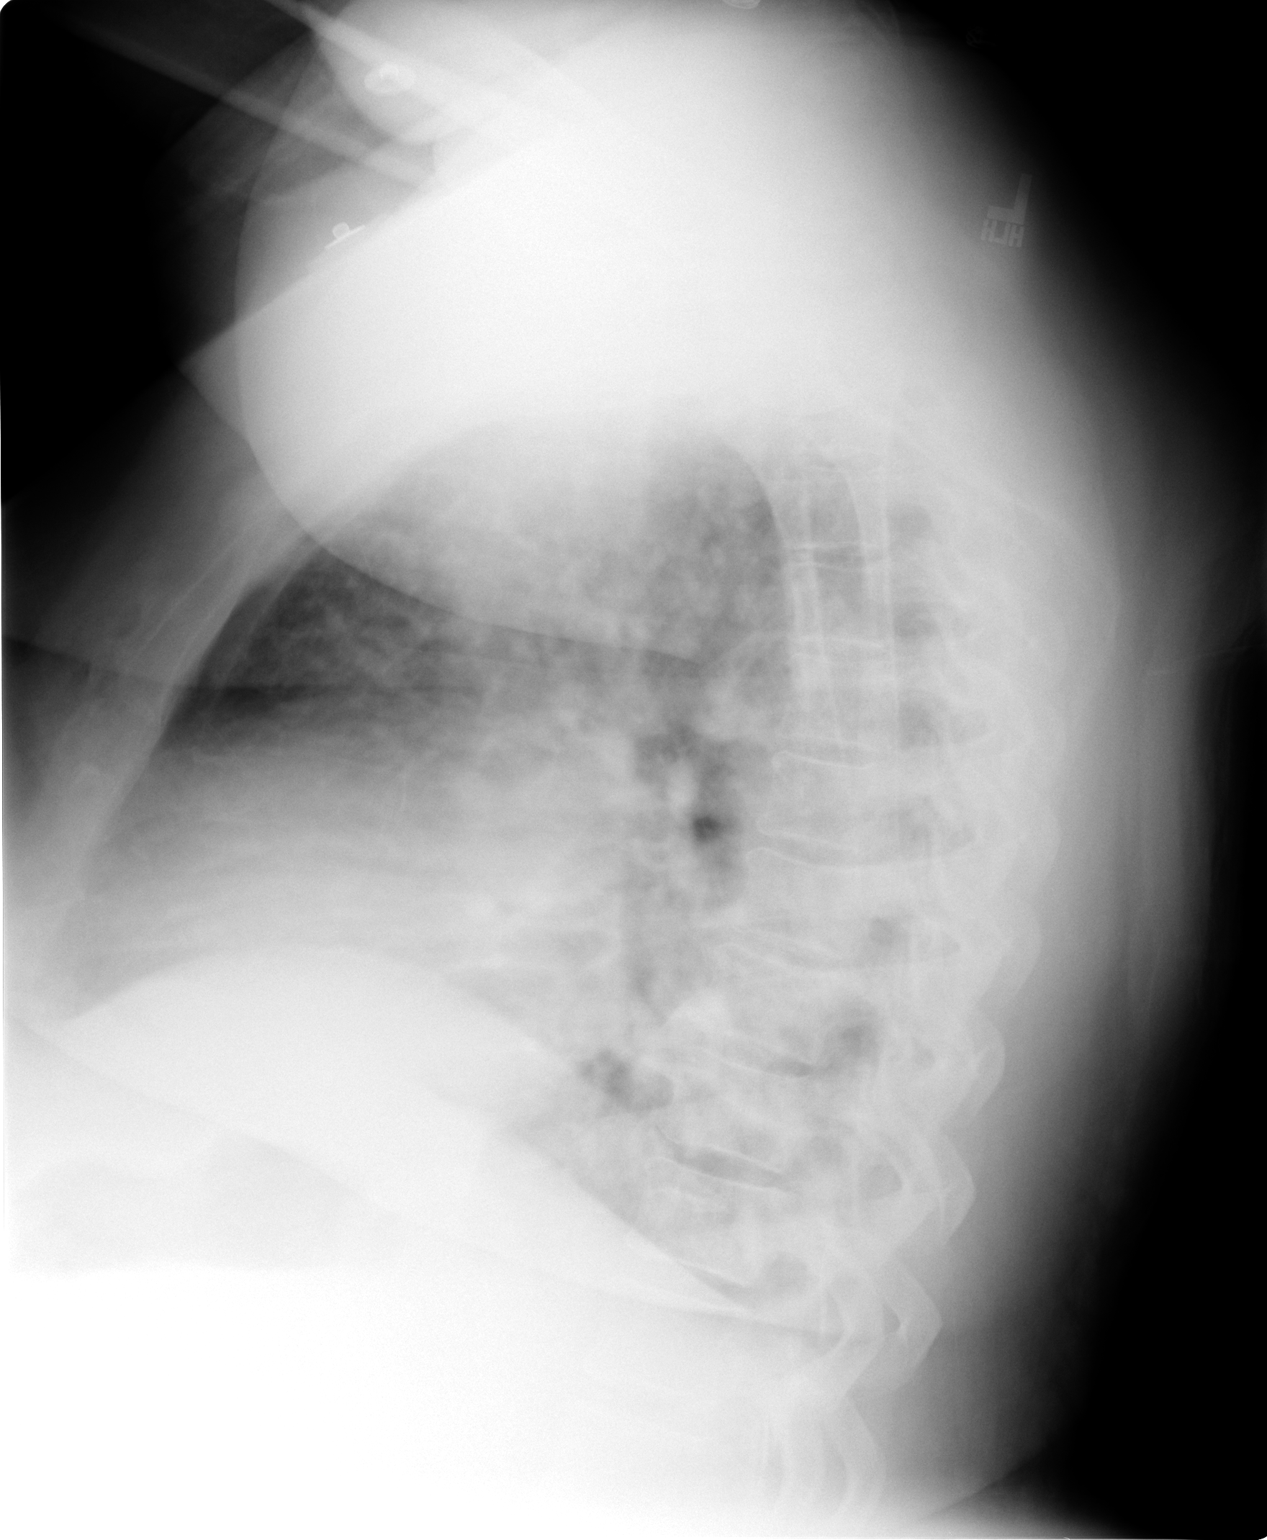

[2 of 2 positions shown; findings below may reference images not displayed]

## 2005-03-11 IMAGING — MR MR THORACIC SPINE W/O CM
5 of 14 series · 18 of 48 positions shown · non-contrast
Comparison: none

CLINICAL DATA: left-sided numbness and pain; assess for compression fractures 
 MRI OF THE CERVICAL SPINE WITHOUT CONTRAST
 Multiplanar T1- and T2-weighted imaging was performed.  
 Alignment of the spine is normal.  There are no fractures or any other osseous pathology.  
 At C2-3, there is a small central disc protrusion that indents the ventral subarachnoid space.  The posterior elements are hypertrophic.  The canal is narrow at this level with an AP diameter of only 6mm.  The cord is slightly flattened at this level.  
 At C3-4, there is a central disc herniation that effaces the ventral subarachnoid space.  This indents the ventral aspect of the cord.  The AP diameter of the canal at this level is also only 6mm.  
 At C4-5, there is a disc bulge but no herniation.  Subarachnoid space surrounds the cord.  
 At C5-6, there is a disc bulge but no herniation.  Subarachnoid space surrounds the cord.
 At C6-7, there is a central disc herniation that indents the ventral subarachnoid space.  Ample subarachnoid space is present dorsal to the cord and there is no compression.  The C7-T1, T1-T2 and upper thoracic region is unremarkable.  
 IMPRESSION
 This patient has spinal stenosis at C2-3, C3-4 and C4-5, most pronounced at the upper two levels due to a small canal with central disc herniations superimposed.  At both levels (C2-3 and C3-4) the AP diameter of the canal in the midline is only 6 to 7mm.  The subarachnoid space surrounding the cord is effaced and the cord is indented somewhat.
 Central disc herniation at C6-7 which indents the subarachnoid space anteriorly.  Ample subarachnoid space is present dorsal to the cord and the cord does not appear compressed at this level.  
 MRI OF THE THORACIC SPINE WITHOUT CONTRAST
 Multiplanar T1- and T2-weighted imaging was performed.
 There is mild scoliosis of the spine, probably not significant.  The marrow shows hypercellularity, quite likely benign.  There are a few scattered benign-appearing hemangiomas.  
 No sign of compression fracture. 
 The thoracic spinal canal is sufficiently patent with ample subarachnoid space surrounding the cord.  No primary cord lesions are seen. 
 Negative MRI of the thoracic spine.

[Series 3: T2 · sagittal · 3.0mm · 0.47mm/px · 2 of 12 slices shown (1 of 4)]
[im 1/12]
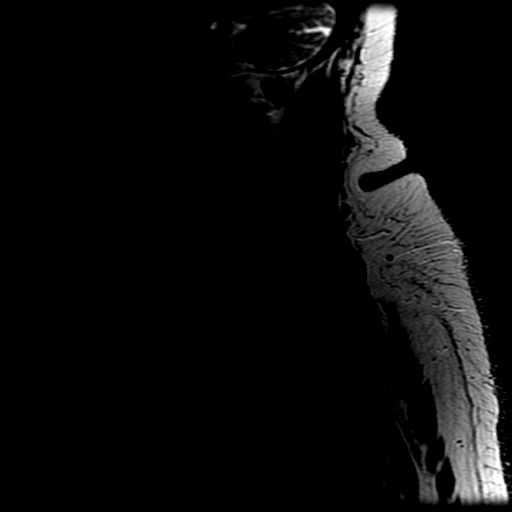
[im 12/12]
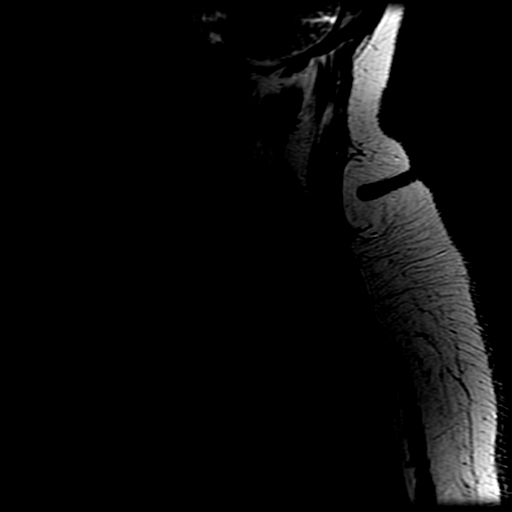

[Series 7: T2 · axial · 3.0mm · 0.39mm/px · z∈[-76,+117]mm · 5 of 24 slices shown (2 of 4)]
[im 1/24]
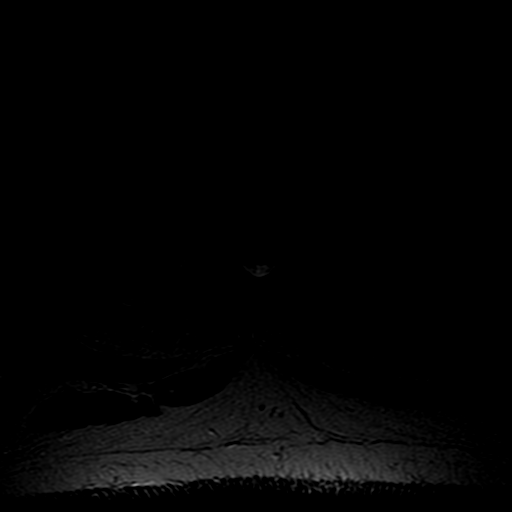
[im 6/24]
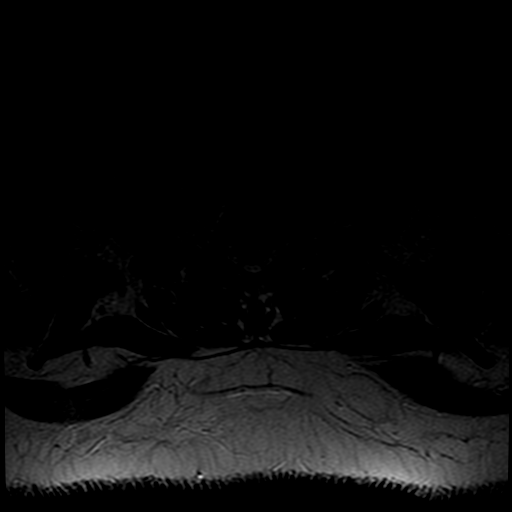
[im 12/24]
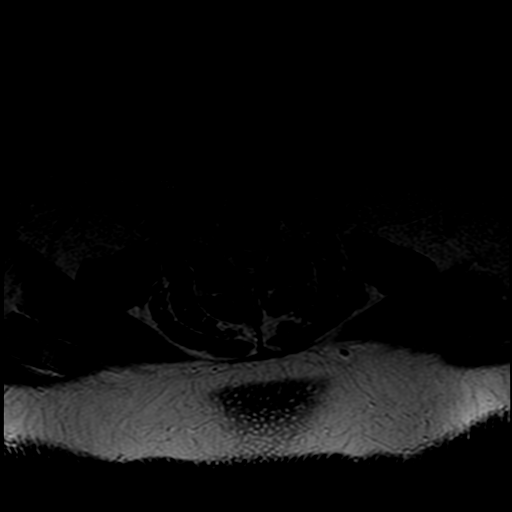
[im 18/24]
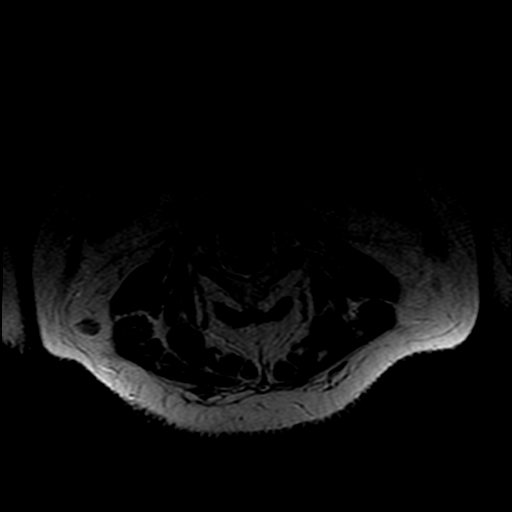
[im 24/24]
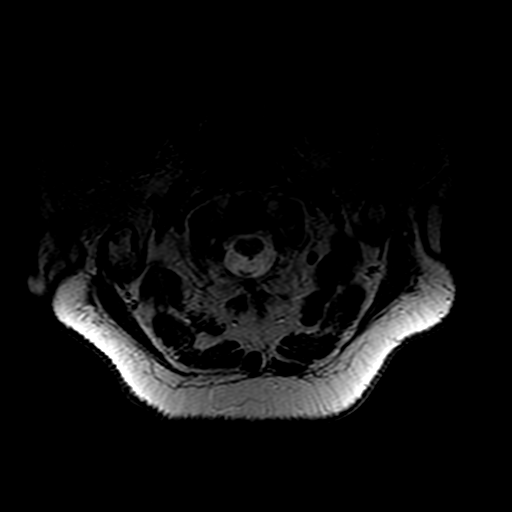

[Series 9: T1 · sagittal · 3.0mm · 0.74mm/px · 4 of 18 slices shown]
[im 1/18]
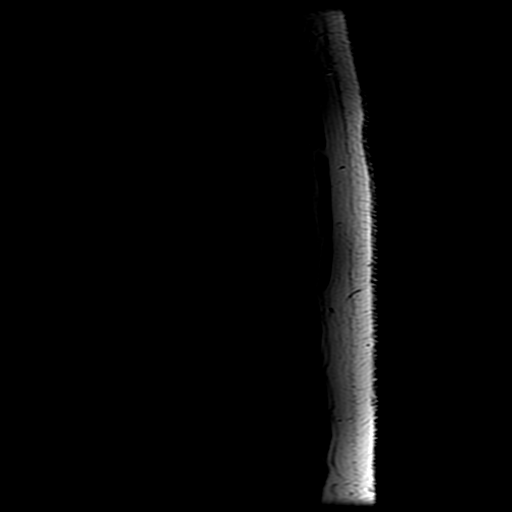
[im 6/18]
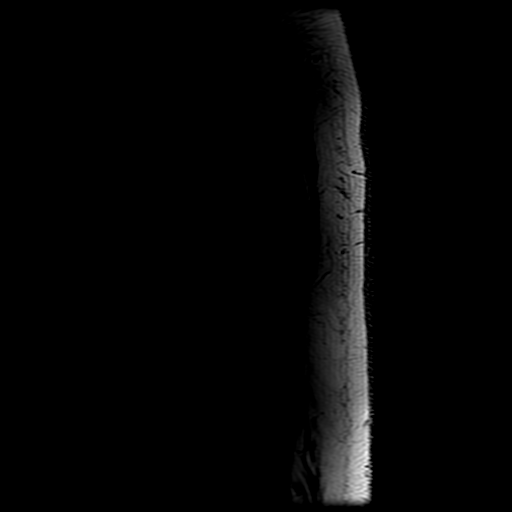
[im 12/18]
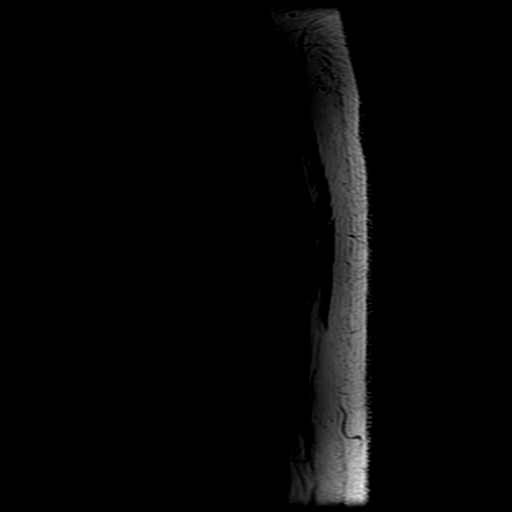
[im 18/18]
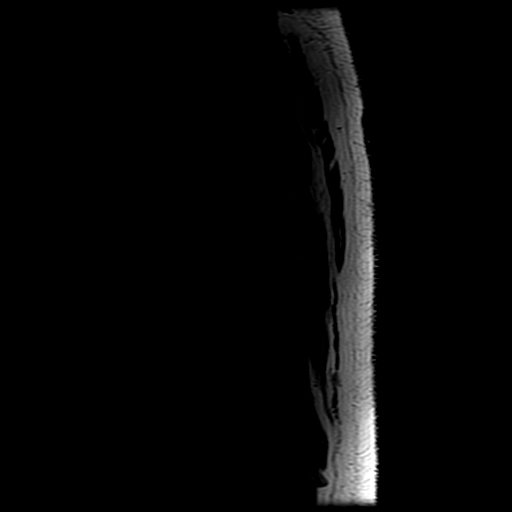

[Series 11: T2 · sagittal · 3.0mm · 0.74mm/px · 4 of 18 slices shown (3 of 4)]
[im 1/18]
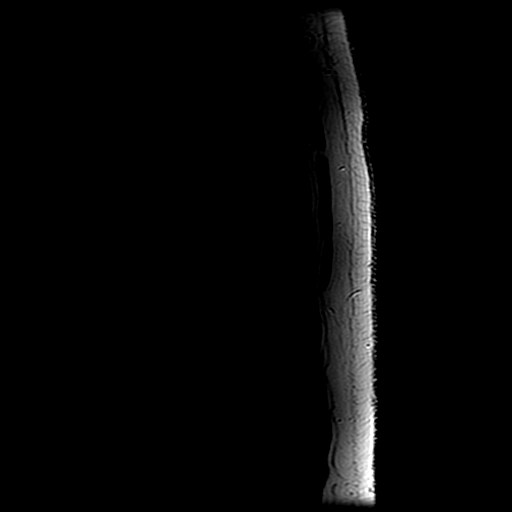
[im 6/18]
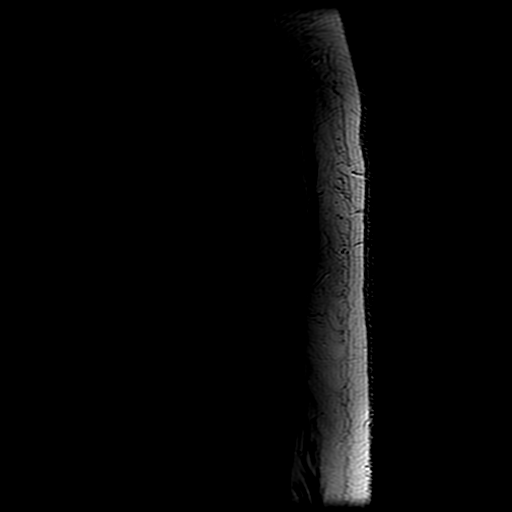
[im 12/18]
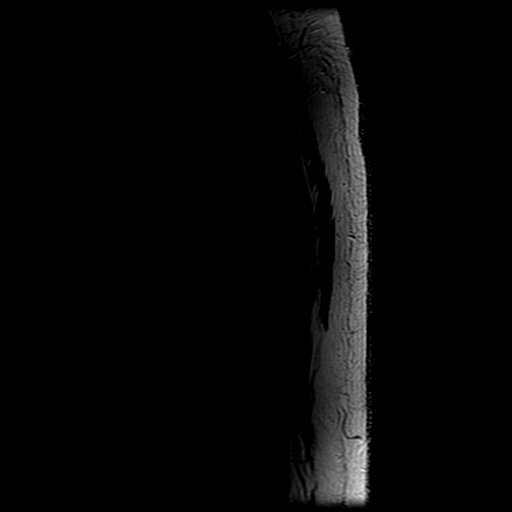
[im 18/18]
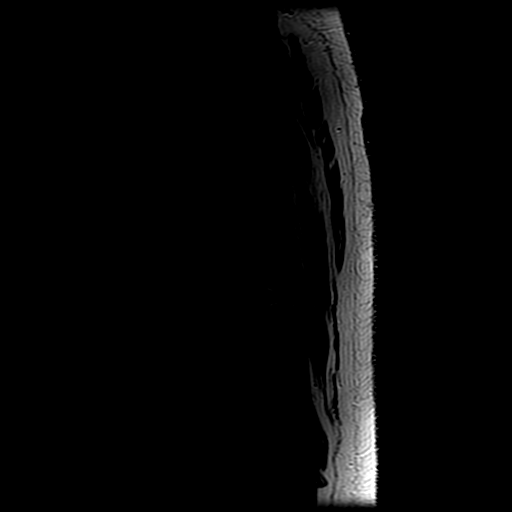

[Series 14: T2 · axial · 4.0mm · 0.43mm/px · z∈[-268,-13]mm · 3 of 17 slices shown (4 of 4)]
[im 1/17]
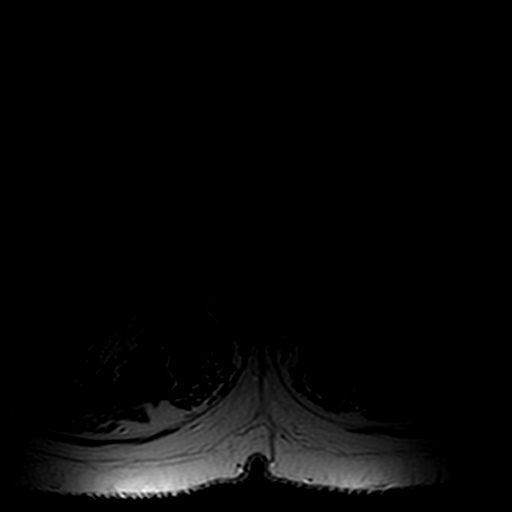
[im 9/17]
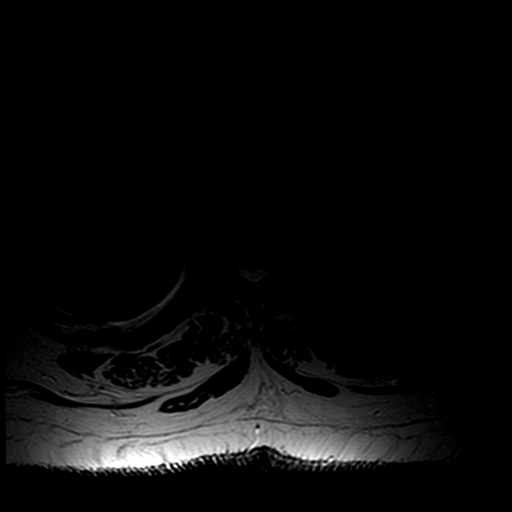
[im 17/17]
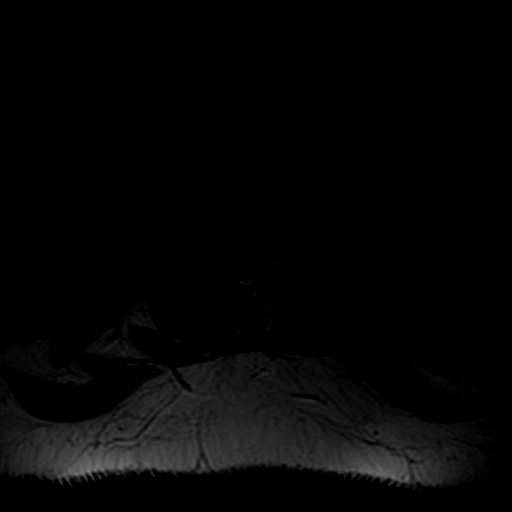

[18 of 48 positions shown; findings below may reference images not displayed]

## 2005-03-25 ENCOUNTER — Encounter (HOSPITAL_COMMUNITY): Admission: RE | Admit: 2005-03-25 | Discharge: 2005-06-23 | Payer: Self-pay | Admitting: Cardiovascular Disease

## 2005-03-30 IMAGING — CR DG CHEST 1V PORT
1 series · 1 of 1 positions shown · non-contrast
Comparison: none

CLINICAL DATA: 58 year old with chest pain. 
 PORTABLE CHEST: 
 A single portable view of the chest is compared with a previous study from 09/29/03. 
 The heart is enlarged.  There is some vascular congestion and mild pattern of pulmonary edema suggesting CHF.  No definite pleural effusions.  There are streaky areas of bibasilar atelectasis.

[view not recorded]
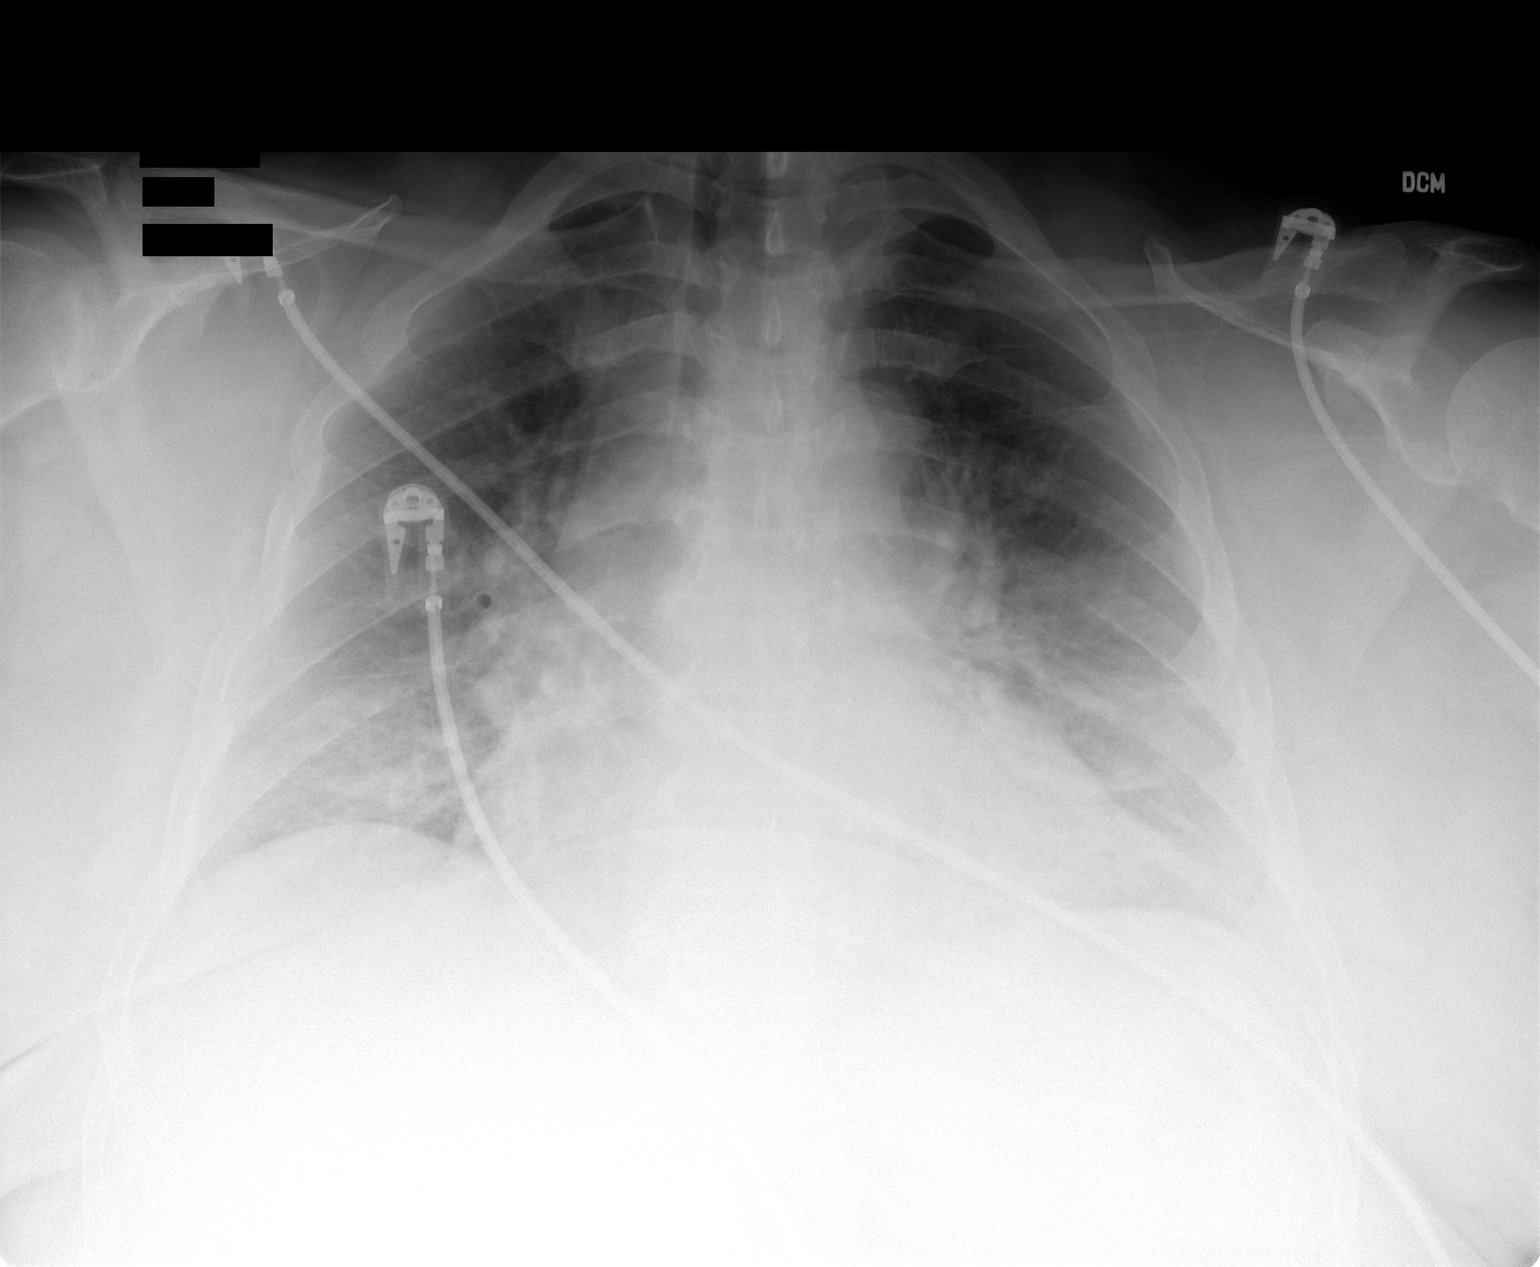

[1 of 1 positions shown; findings below may reference images not displayed]

IMPRESSION: 1.  Cardiac enlargement with vascular congestion and possible mild edema. 
 2.  Streaky bibasilar atelectasis.

## 2005-04-11 ENCOUNTER — Inpatient Hospital Stay (HOSPITAL_COMMUNITY): Admission: RE | Admit: 2005-04-11 | Discharge: 2005-04-12 | Payer: Self-pay | Admitting: Specialist

## 2005-04-14 IMAGING — CR DG CHEST 2V
2 series · 2 of 2 positions shown · non-contrast
Comparison: none

CLINICAL DATA: Hypertension, diabetes, fever. 

 CHEST, TWO VIEWS:
 Comparison 11/21/03. 
 The heart is enlarged.  There is vascular congestion.  No significant edema or effusion.  There is mild atelectasis in the lung bases.

[view not recorded (1 of 2)]
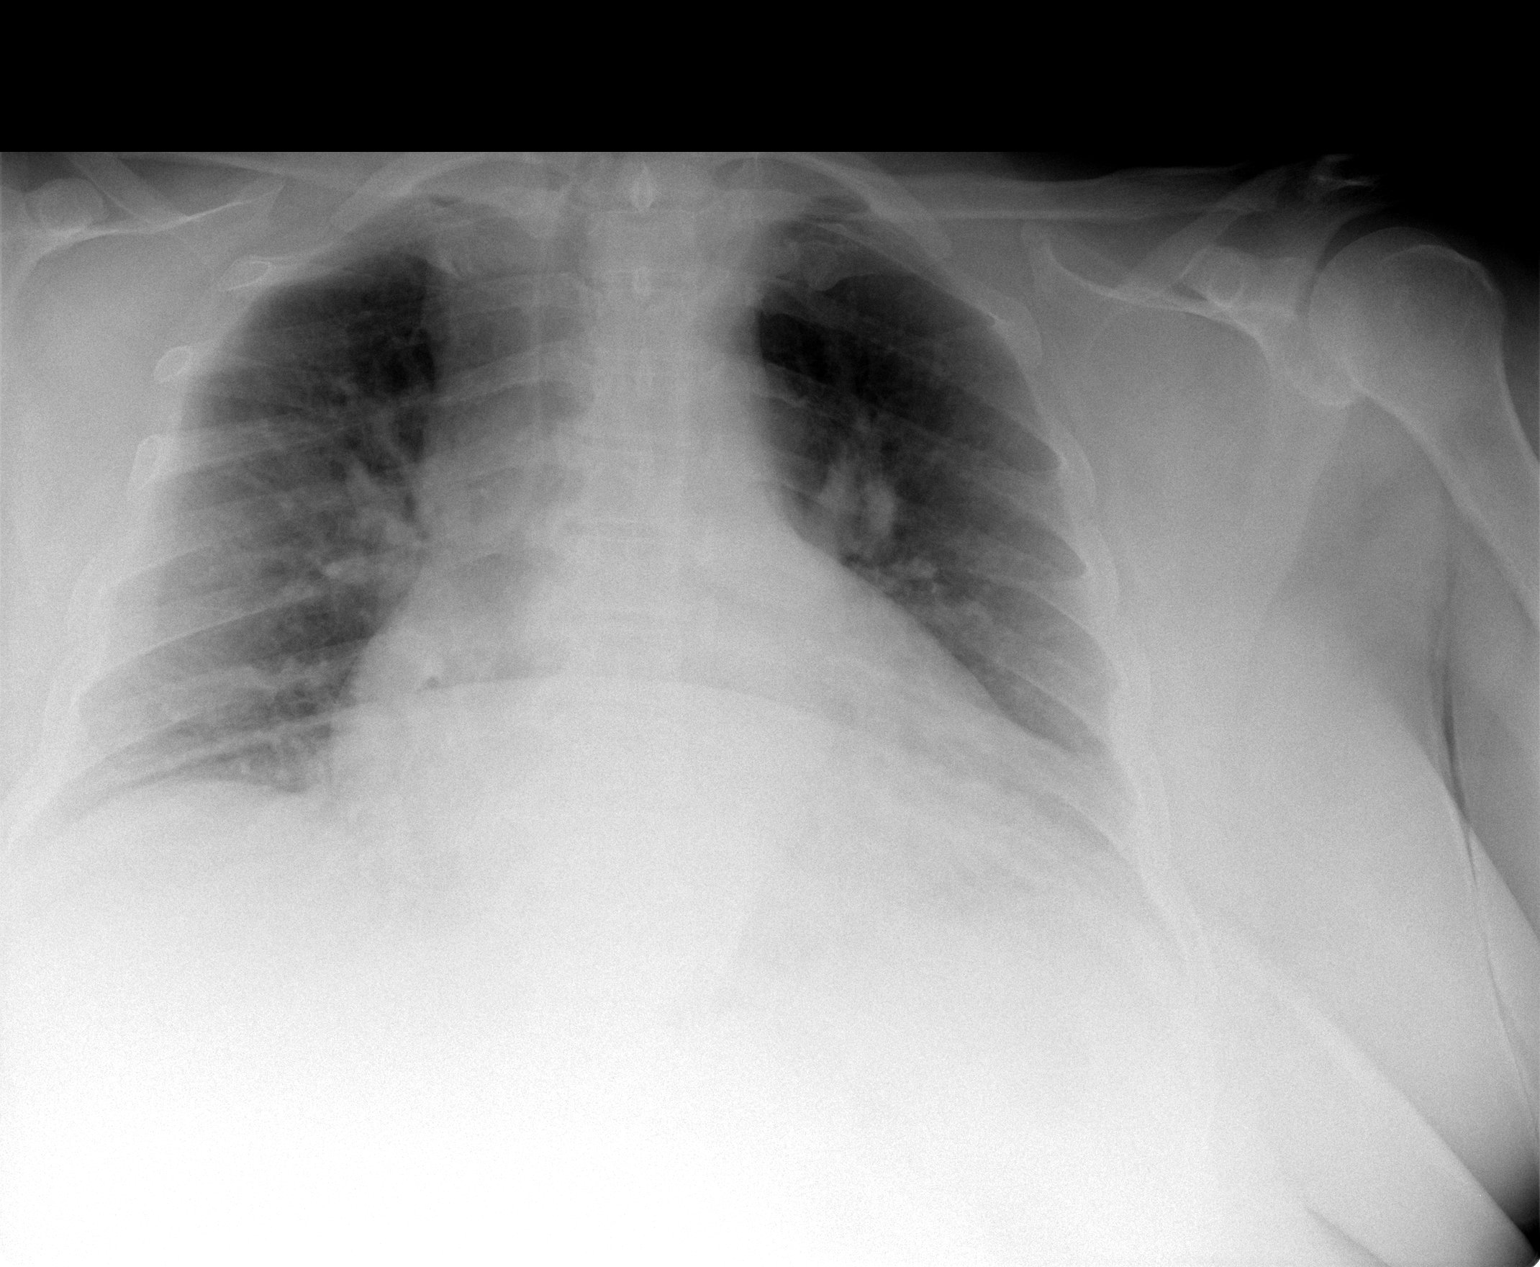

[view not recorded (2 of 2)]
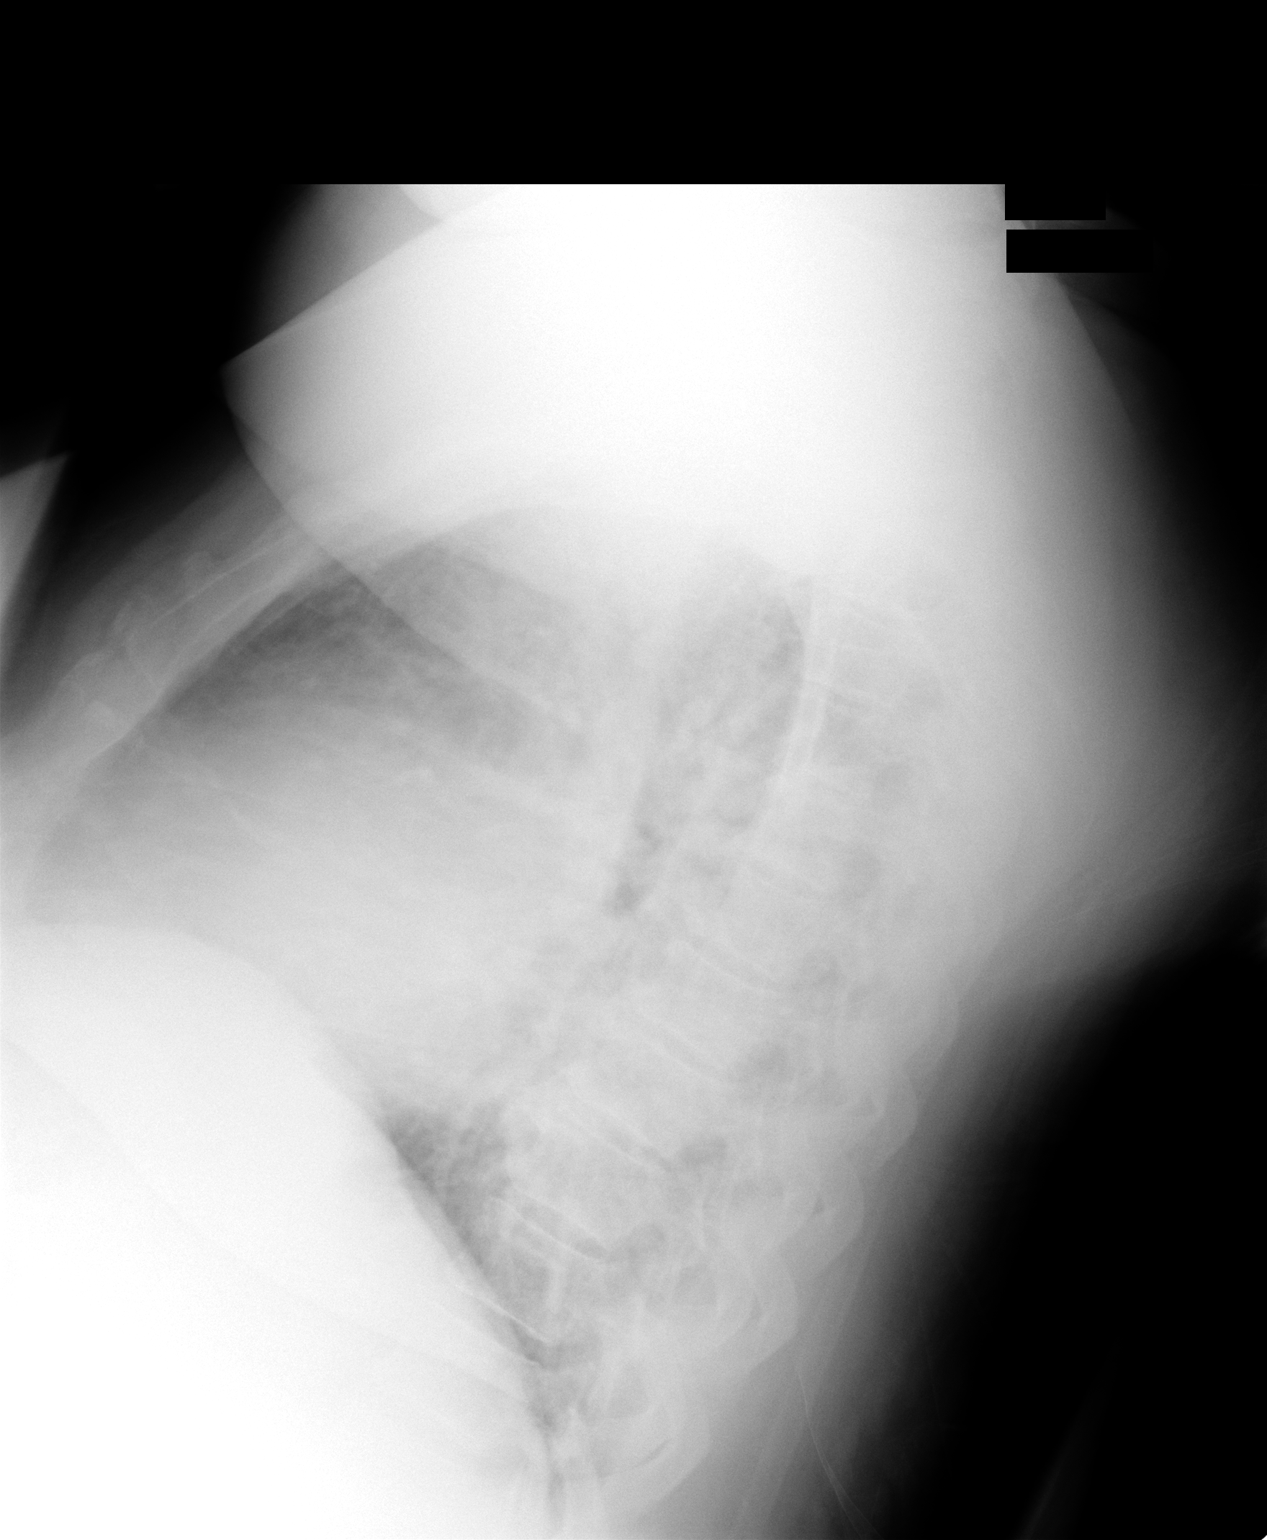

[2 of 2 positions shown; findings below may reference images not displayed]

IMPRESSION: Cardiac enlargement and vascular congestion, improved from the prior study. 

 [REDACTED]

## 2005-04-15 IMAGING — RF DG FLUORO GUIDE NDL PLC/BX
2 series · 2 of 2 positions shown · non-contrast
Comparison: none

CLINICAL DATA: Knee pain and fever. 
 FLUOROSCOPICALLY-GUIDED RIGHT KNEE ASPIRATION:
 Written informed consent was obtained.  I explained the procedure to the patient in detail.  Two prior attempts were made in the Emergency Department to aspirate the knee.  Under fluoroscopic guidance and palpation, an appropriate site for arthrocentesis was marked on the patient?s skin, the patient was prepped and draped in the usual sterile fashion.  Local anesthesia was achieved with 1% Xylocaine.  A 21 gauge needle was then inserted down into the joint.  I was unable to aspirate any fluid.  I did inject approximately 7 cc of Hypaque 60 to document the intraarticular location of the needle.  I then tried to aspirate, again without success.

[Series 1: run · 1 of 1 slices shown (1 of 2)]
[im 1/1]
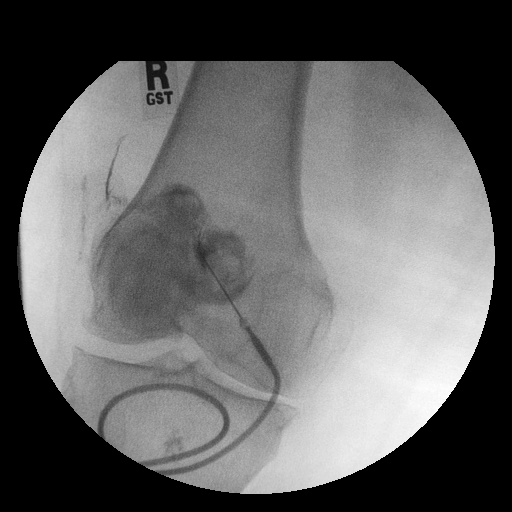

[Series 2: run · 1 of 1 slices shown (2 of 2)]
[im 1/1]
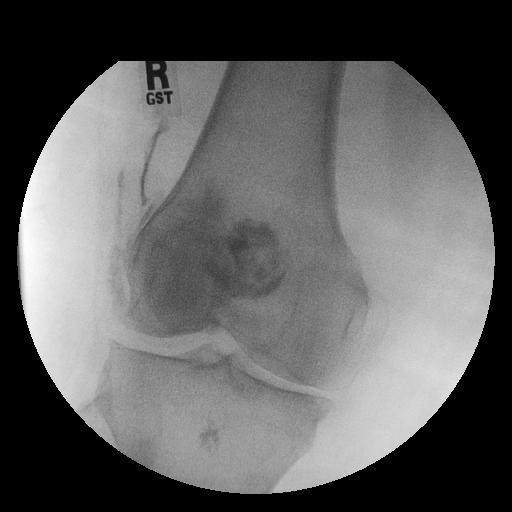

[2 of 2 positions shown; findings below may reference images not displayed]

IMPRESSION: No joint fluid was aspirated.  Contrast was injected to document intraarticular position of the needle.

## 2005-04-15 IMAGING — CR DG KNEE 1-2V*R*
2 series · 2 of 2 positions shown · non-contrast
Comparison: none

CLINICAL DATA: Knee swelling.
 RIGHT KNEE-TWO VIEWS:
 This study is limited by large body habitus.  No obvious acute fractures or dislocations are seen. A joint effusion is present.  Tricompartment osteoarthritic change is noted.

[view not recorded (1 of 2)]
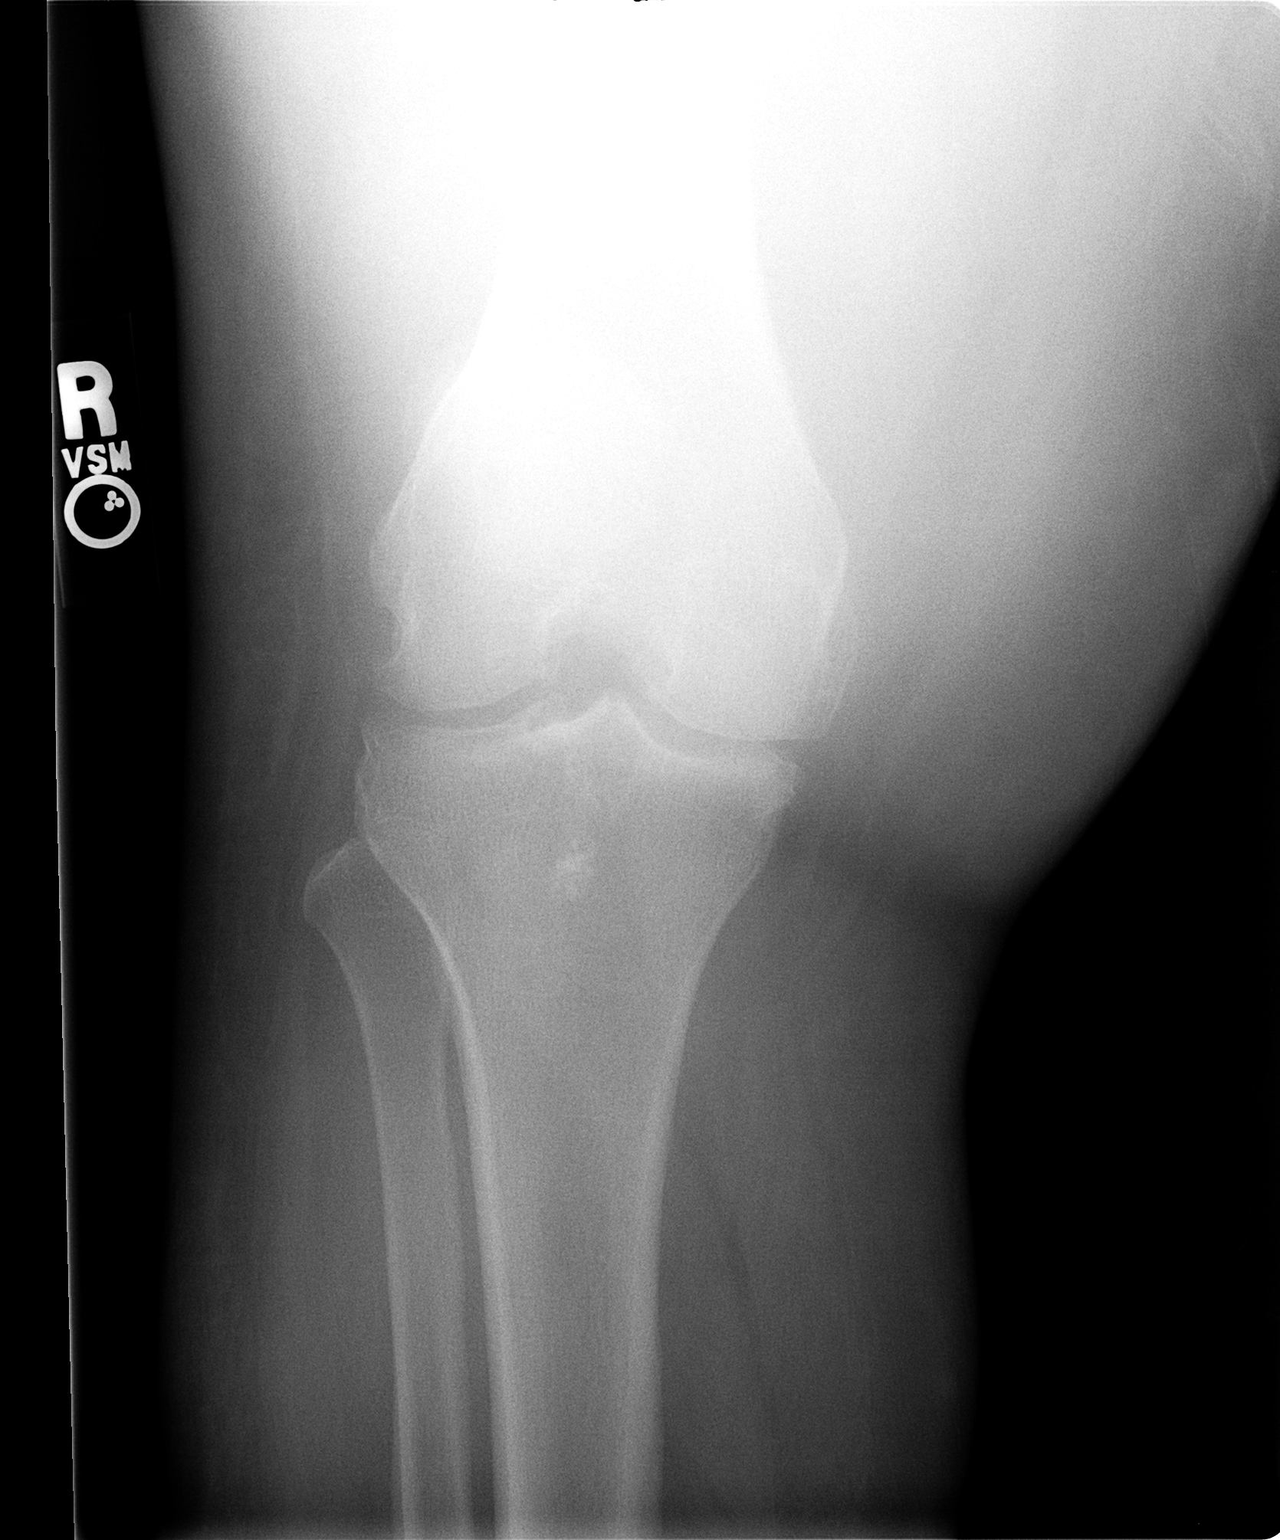

[view not recorded (2 of 2)]
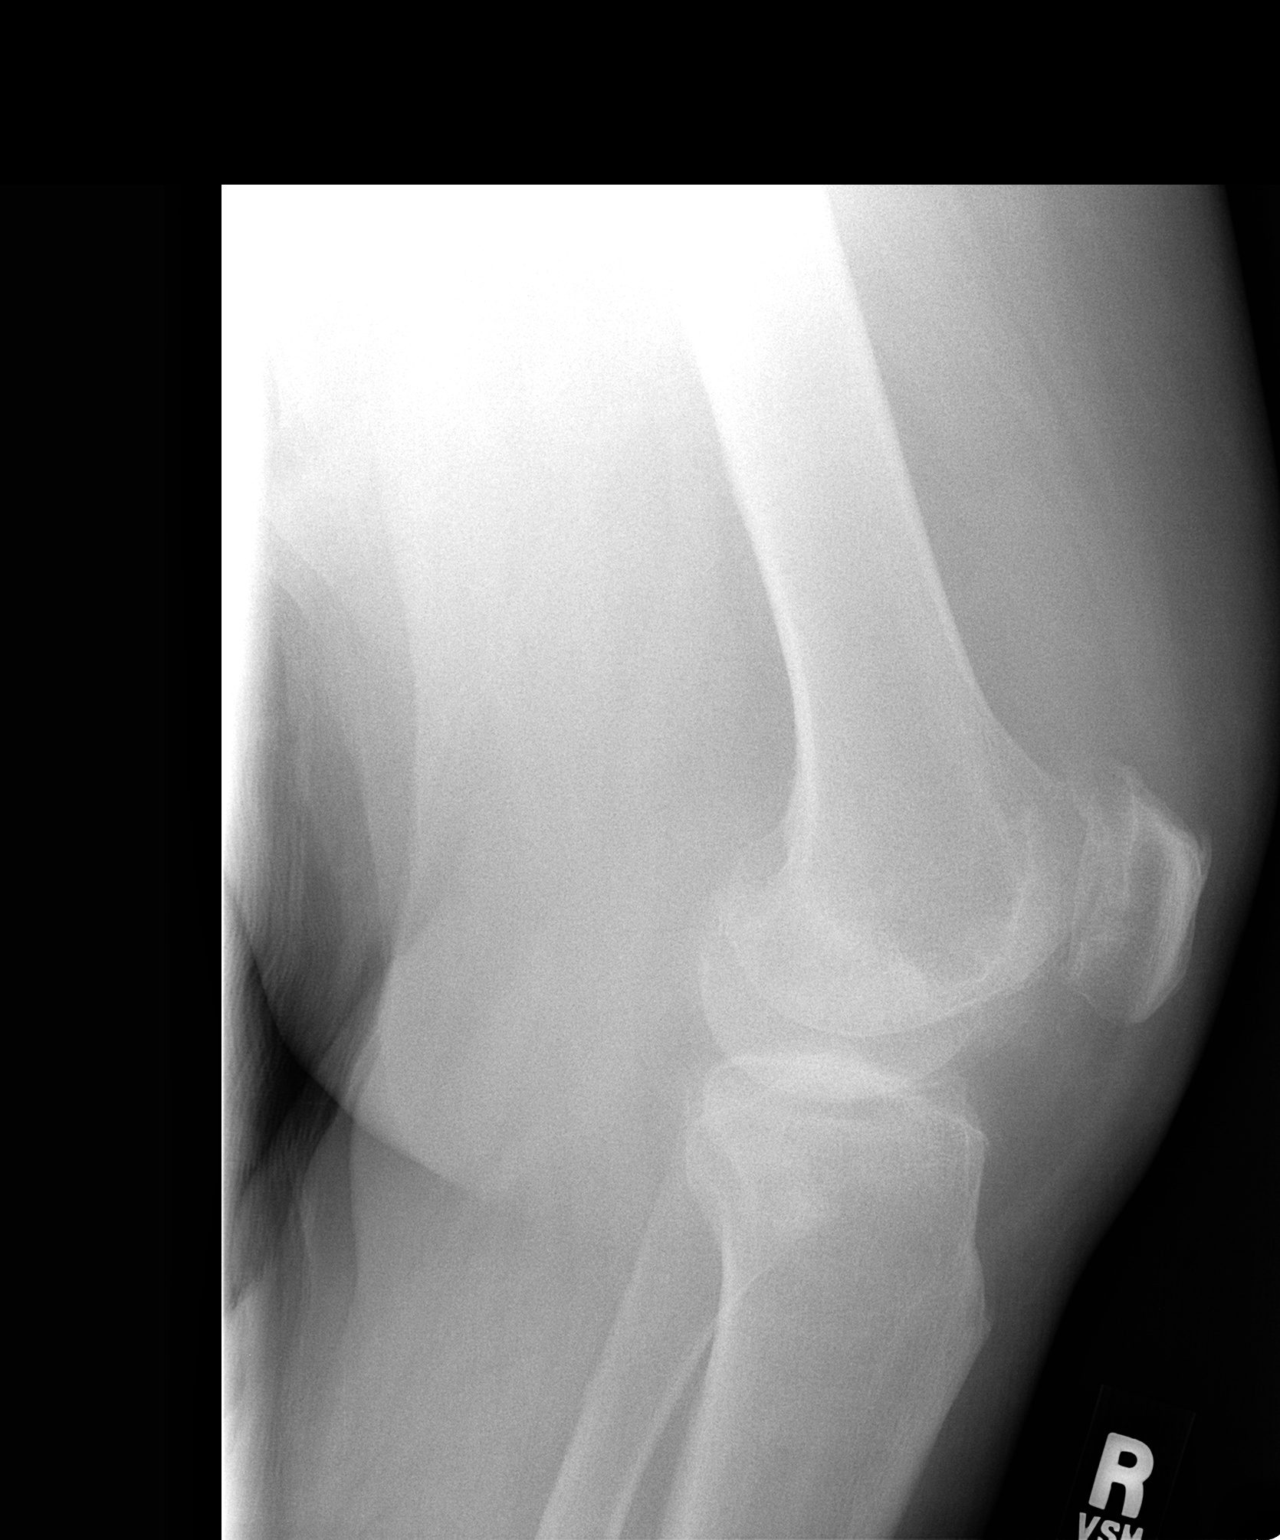

[2 of 2 positions shown; findings below may reference images not displayed]

IMPRESSION: No evidence of acute bony injury.  Osteoarthritic changes present.

## 2005-04-16 IMAGING — MR MR [PERSON_NAME] LOW JT W/O CM*R*
6 of 10 series · 25 of 40 positions shown · non-contrast
Comparison: none

CLINICAL DATA: Knee pain after her knee gave way and popped.  The patient is unable to weight-bear.
 MRI OF THE RIGHT KNEE WITHOUT CONTRAST ? 12/08/03: 
 The scan demonstrates a complex tear of the anterior horn and midbody of the medial meniscus.  The tear appears to extend to the undersurface with blunting of the midbody of the meniscus.  There is degeneration of the posterior horn without a discrete tear.  
 The lateral meniscus is intact.  The cruciate and collateral ligaments are intact.  There is a moderate effusion with severe chondromalacia of the patellofemoral compartment.  There are multiple Grade IV subchondral cysts in the patella and in the trochlear groove.  The patient has fairly extensive marginal spur formation in all three compartments.
 There is no evidence of a significant popliteal cyst.  There is edema in the subcutaneous tissues surrounding the knee.

[Series 1: 3 plane loc · axial · 7.0mm · 0.94mm/px · z∈[-56,+120]mm · 8 of 45 slices shown]
[im 1/45]
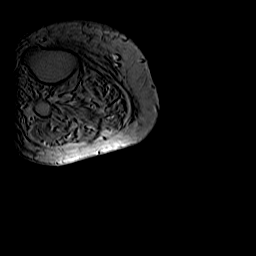
[im 7/45]
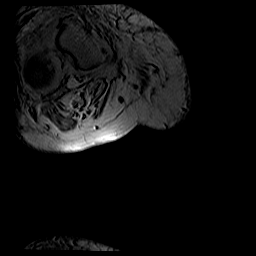
[im 13/45]
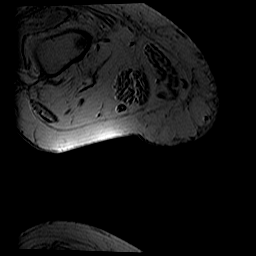
[im 19/45]
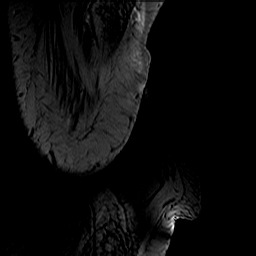
[im 26/45]
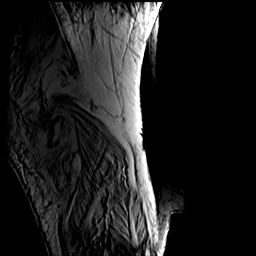
[im 32/45]
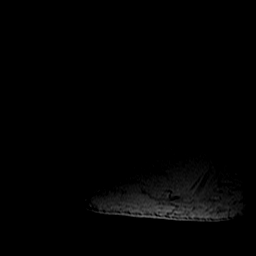
[im 38/45]
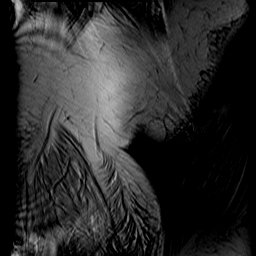
[im 45/45]
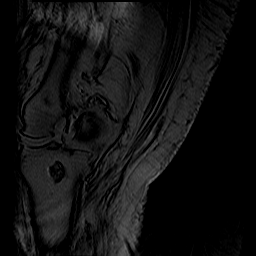

[Series 2: PD · axial · 5.0mm · 0.31mm/px · z∈[-62,+51]mm · 3 of 20 slices shown (1 of 2)]
[im 1/20]
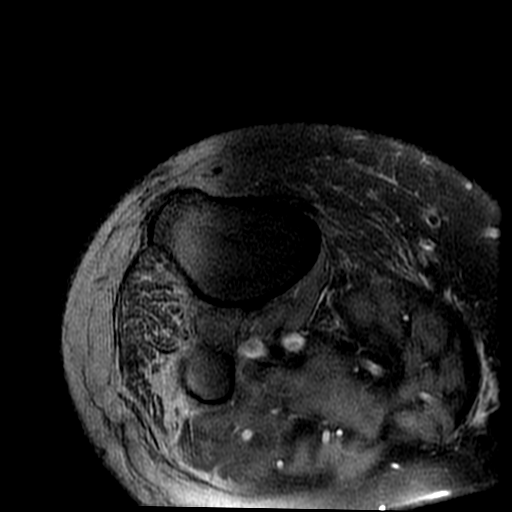
[im 10/20]
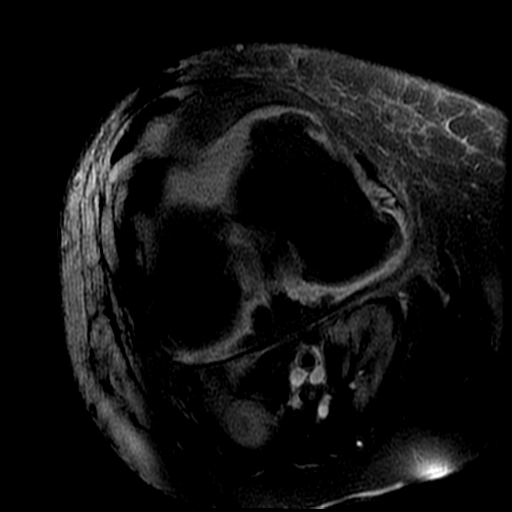
[im 20/20]
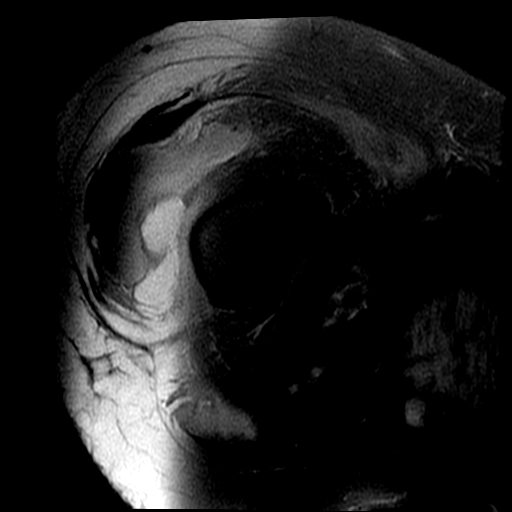

[Series 3: T2 · axial · 5.0mm · 0.31mm/px · z∈[-62,+51]mm · 3 of 20 slices shown]
[im 1/20]
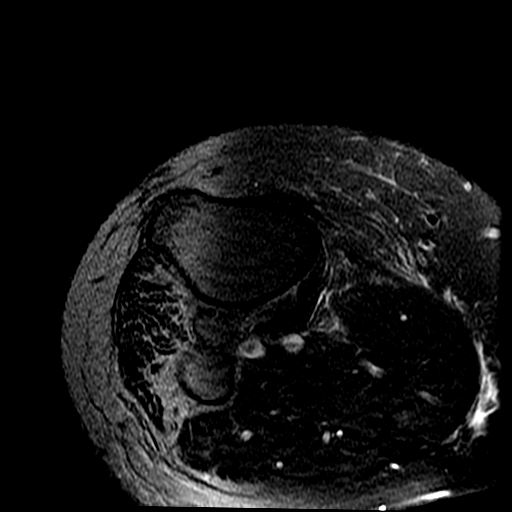
[im 10/20]
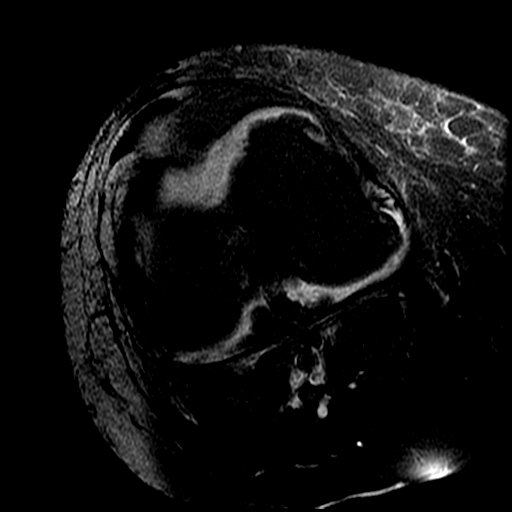
[im 20/20]
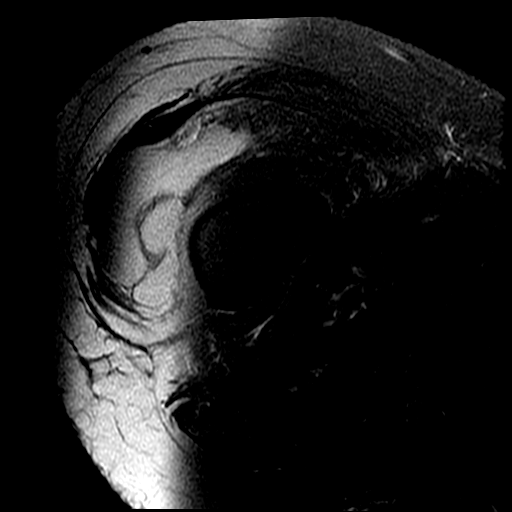

[Series 4: ax. fstir · axial · 5.0mm · 0.31mm/px · z∈[-62,-8]mm · 2 of 20 slices shown]
[im 1/20]
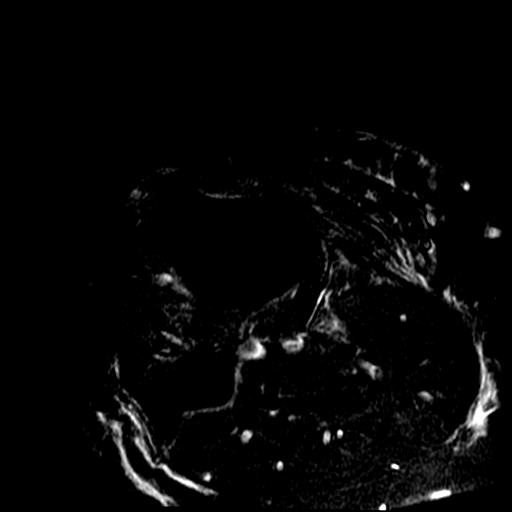
[im 10/20]
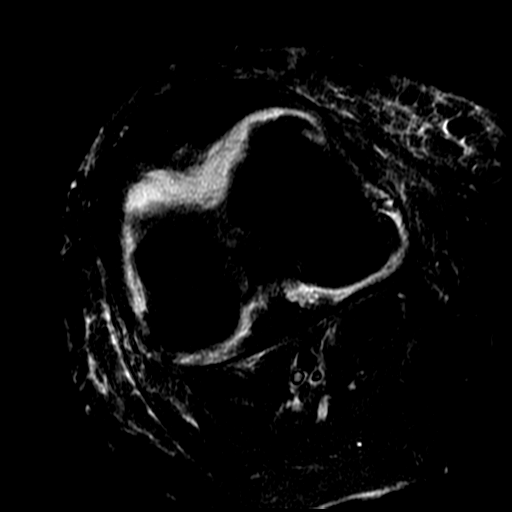

[Series 5: T1 · coronal · 4.0mm · 0.33mm/px · 6 of 40 slices shown]
[im 1/40]
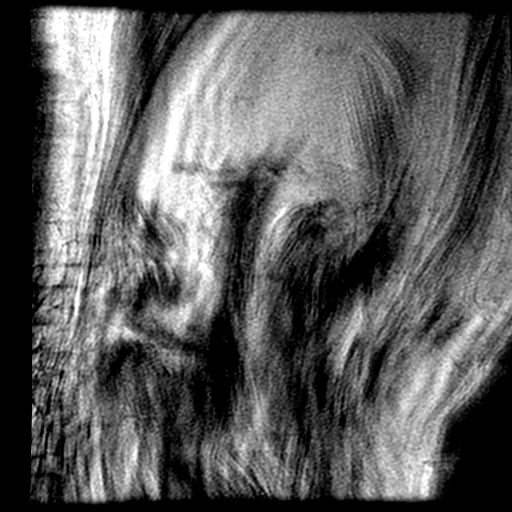
[im 8/40]
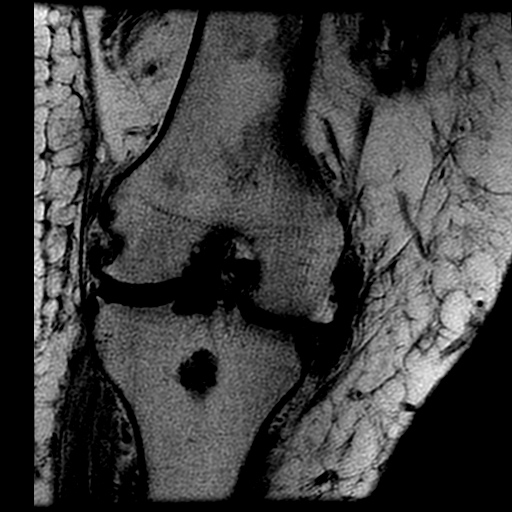
[im 16/40]
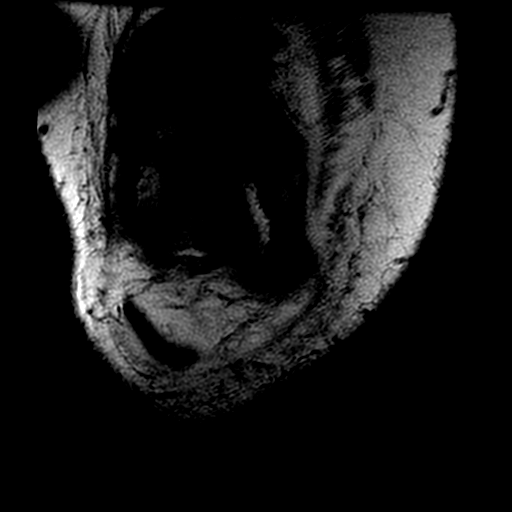
[im 24/40]
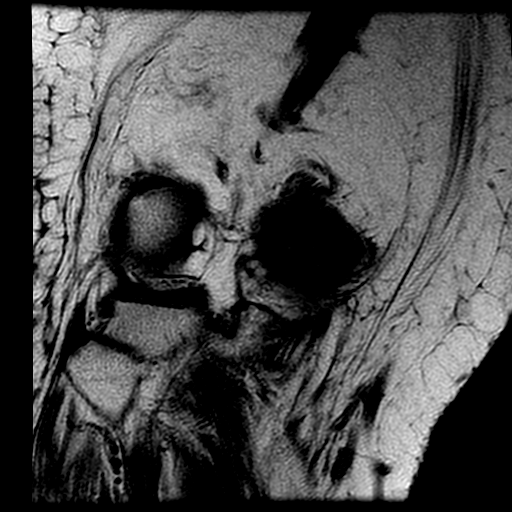
[im 32/40]
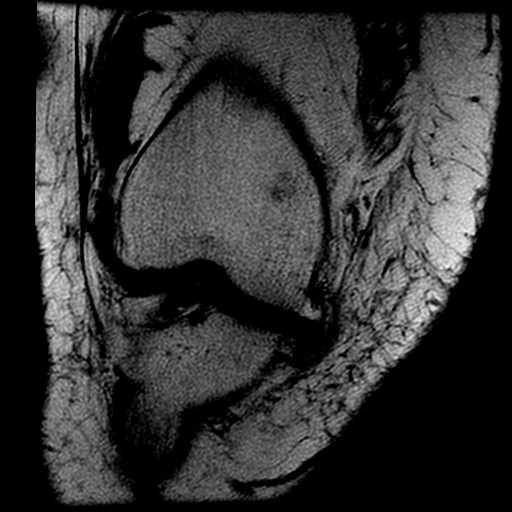
[im 40/40]
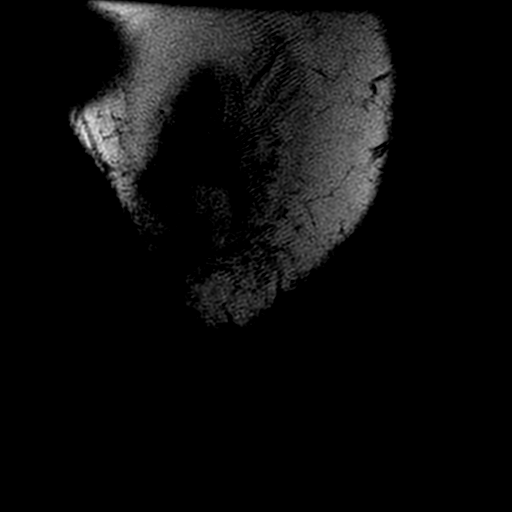

[Series 7: PD · coronal · 4.0mm · 0.33mm/px · 3 of 20 slices shown (2 of 2)]
[im 1/20]
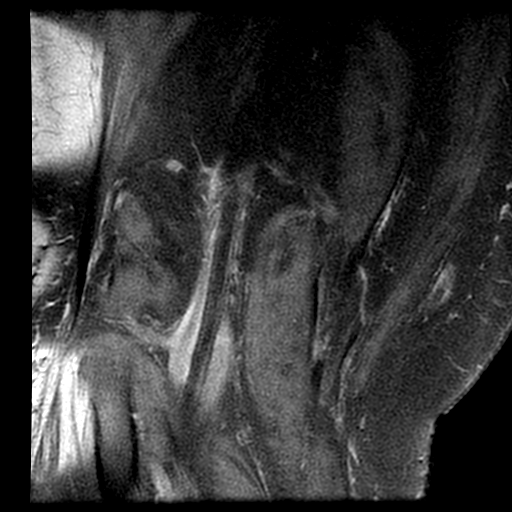
[im 10/20]
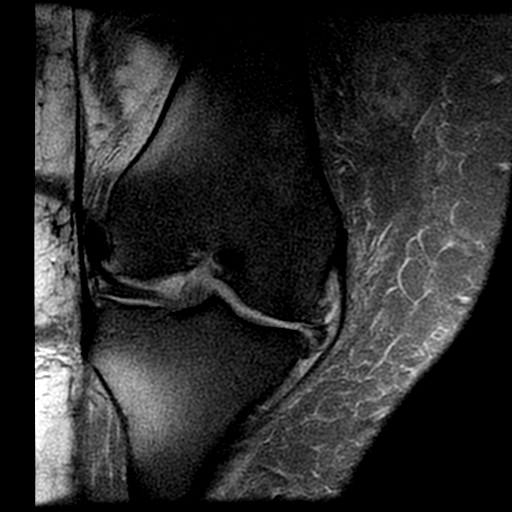
[im 20/20]
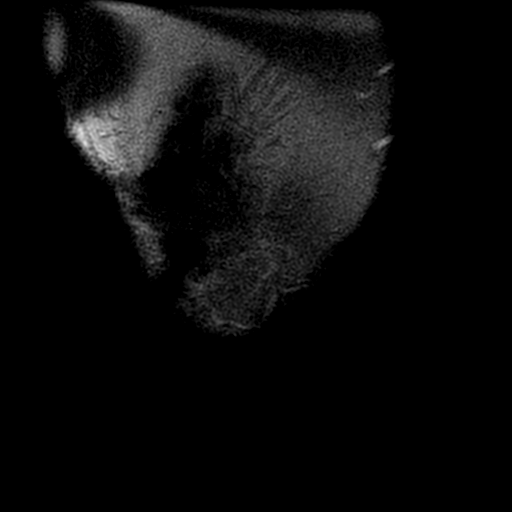

[25 of 40 positions shown; findings below may reference images not displayed]

IMPRESSION: Complex tear of the anterior horn and midbody of the medial meniscus.
 Tricompartmental degenerative arthritic changes with extensive Grade IV chondromalacia of the patellofemoral compartment.

## 2005-04-18 IMAGING — US US RETROPERITONEAL COMPLETE
1 series · 14 of 23 positions shown · non-contrast
Comparison: CT abdomen 09/27/2003.

CLINICAL DATA: Renal insufficiency.  
COMPLETE URINARY TRACT ULTRASOUND - 11/30/2003:

[Series 1: renal · 0.37mm/px · 14 of 23 slices shown]
[im 1/23]
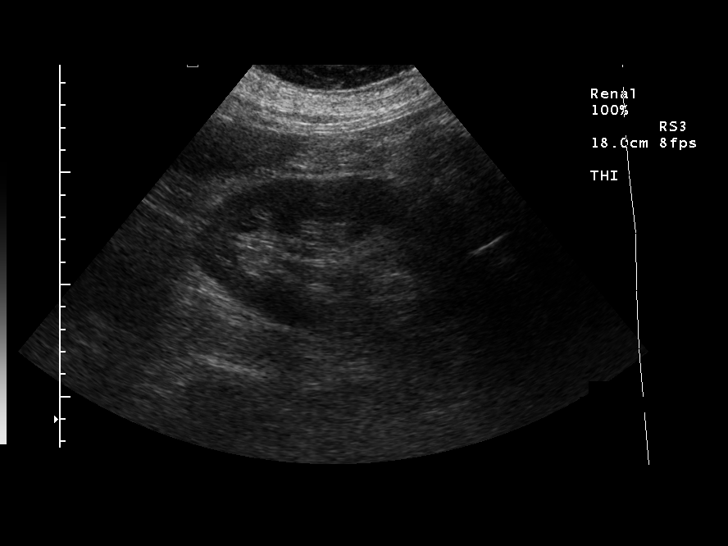
[im 3/23]
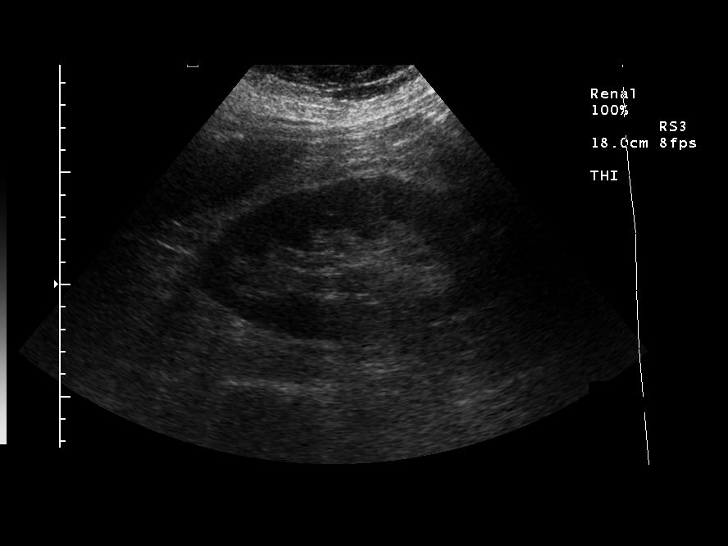
[im 5/23]
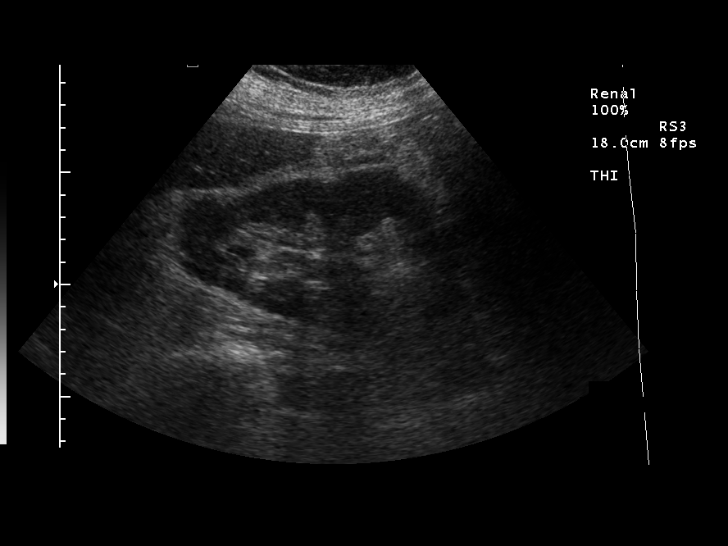
[im 6/23]
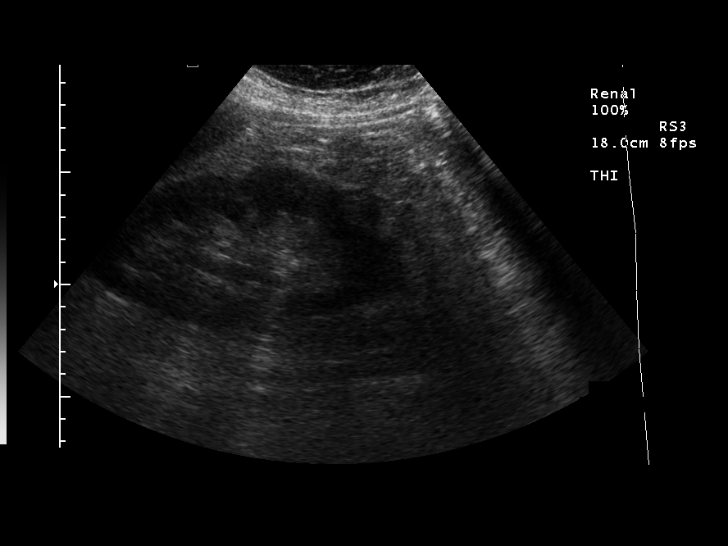
[im 8/23]
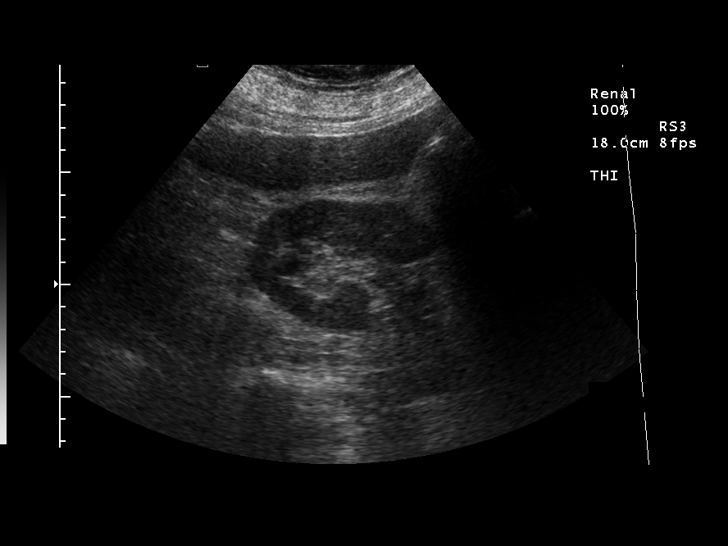
[im 10/23]
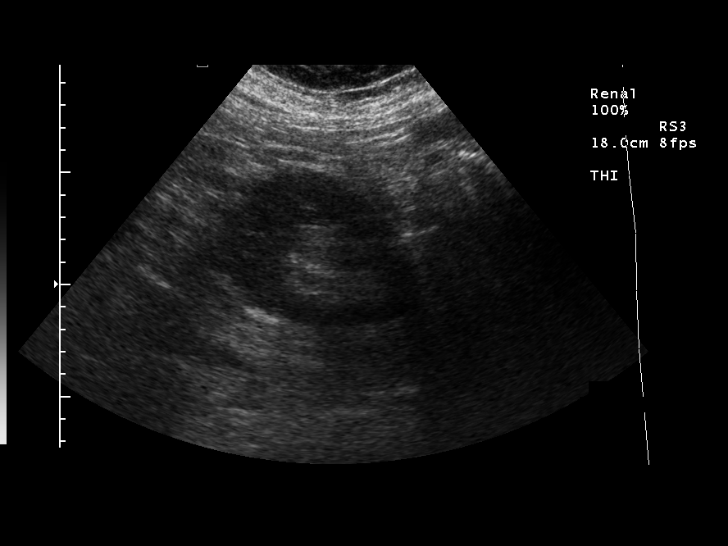
[im 11/23]
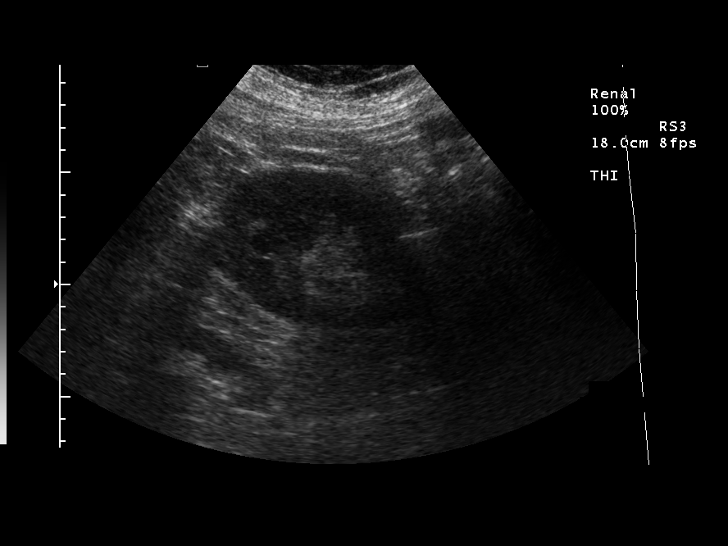
[im 13/23]
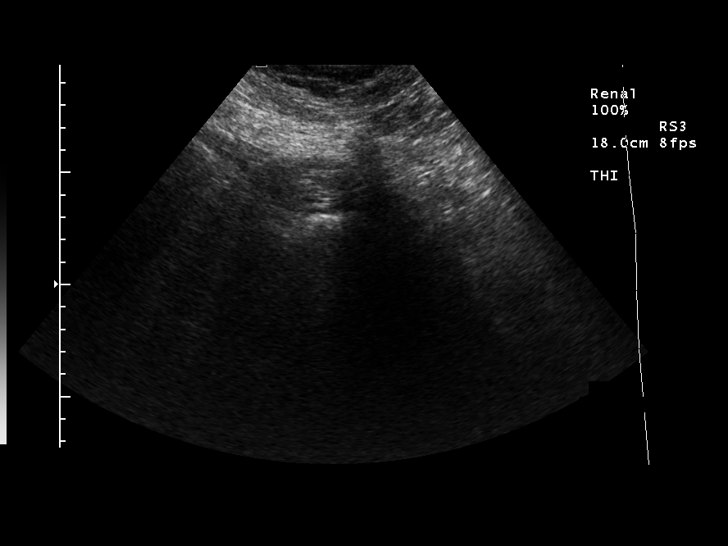
[im 14/23]
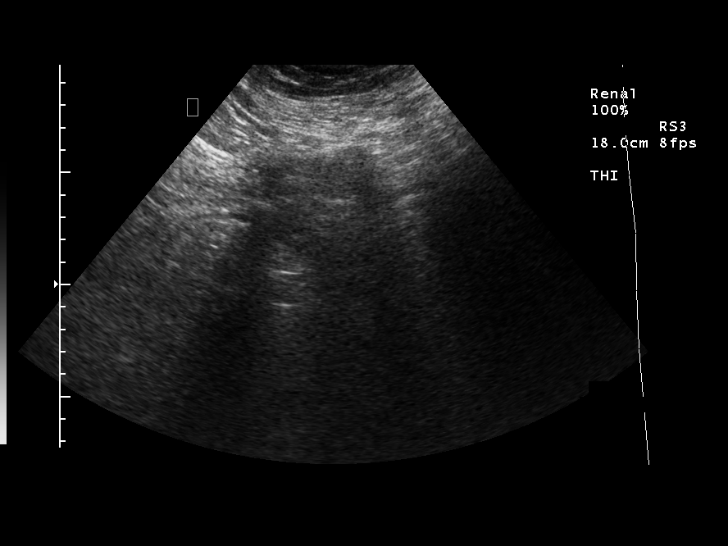
[im 16/23]
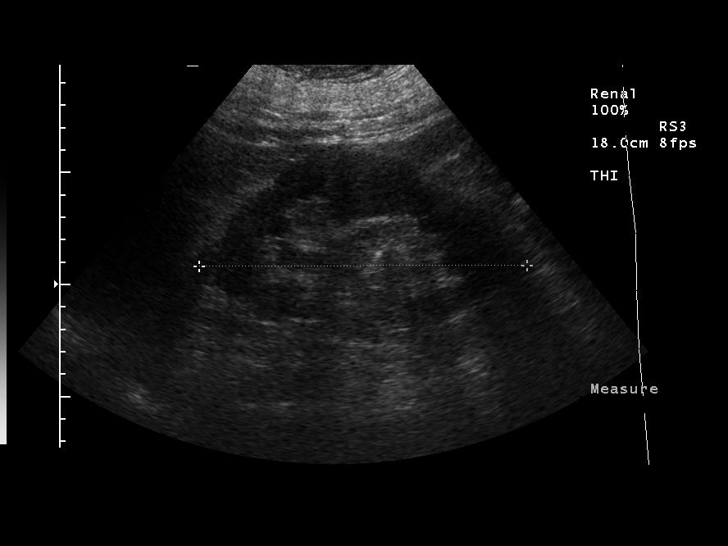
[im 18/23]
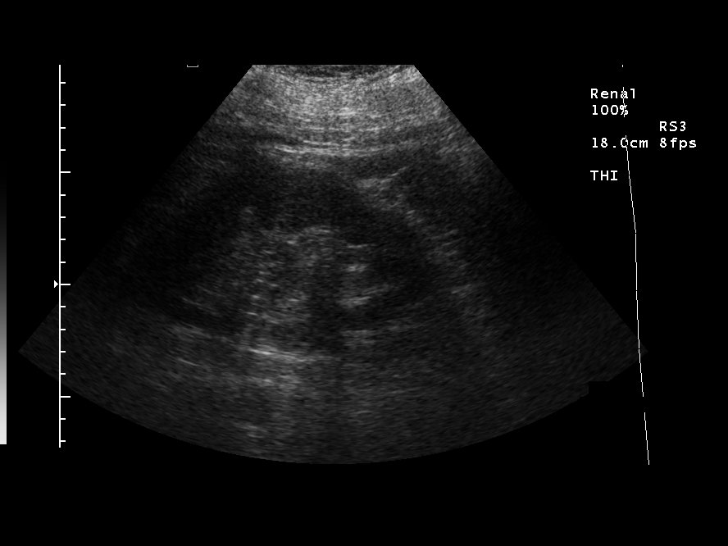
[im 19/23]
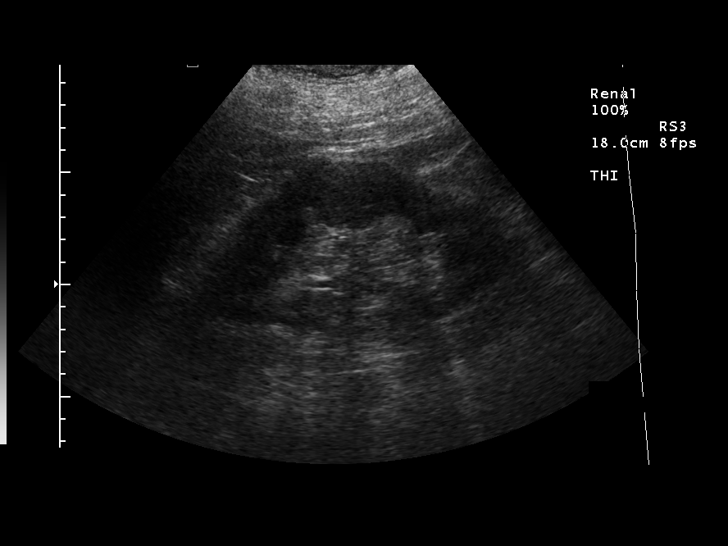
[im 21/23]
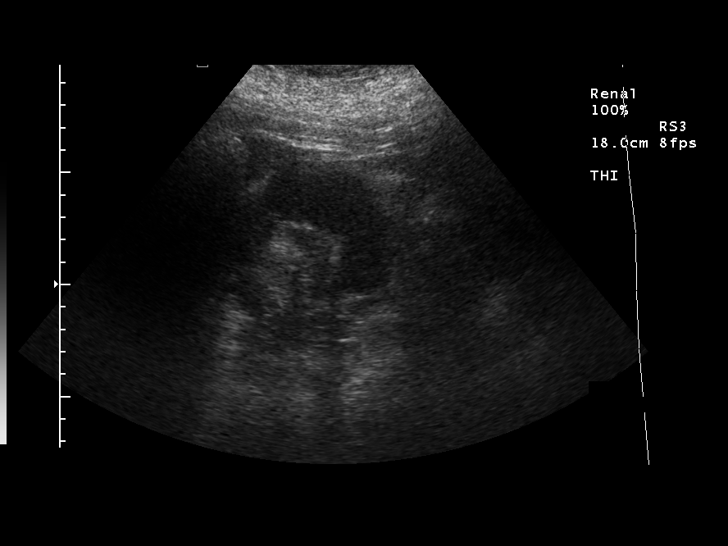
[im 23/23]
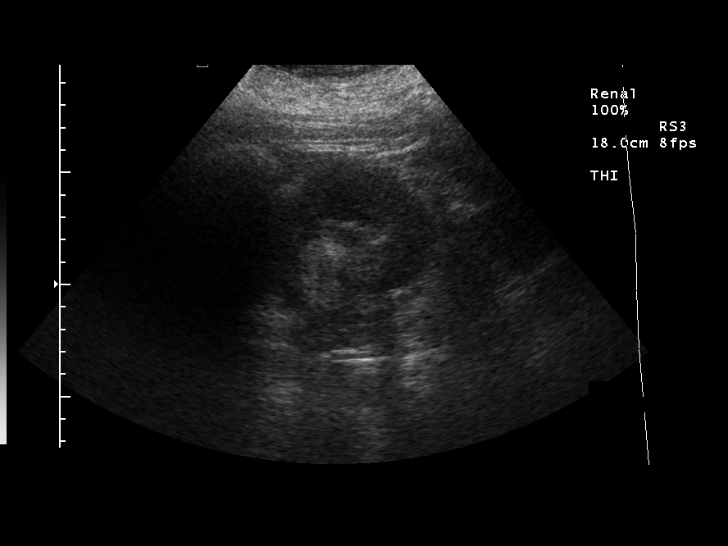

[14 of 23 positions shown; findings below may reference images not displayed]

Right Kidney:  No evidence of hydronephrosis.  Normal parenchymal echotexture.  Well preserved cortical thickness.   No focal parenchymal abnormalities.  14.5 cm length.  
Left Kidney:  No evidence of hydronephrosis.  Normal parenchymal echotexture.  Well preserved cortical thickness.   No focal parenchymal abnormalities.  14.6 cm length. 
Bladder:  Decompressed by Foley catheter.
IMPRESSION: Normal appearing kidneys.  No evidence of hydronephrosis or focal renal parenchymal abnormality.

## 2005-07-09 ENCOUNTER — Ambulatory Visit: Admission: RE | Admit: 2005-07-09 | Discharge: 2005-07-09 | Payer: Self-pay | Admitting: Specialist

## 2005-07-28 ENCOUNTER — Encounter: Admission: RE | Admit: 2005-07-28 | Discharge: 2005-07-28 | Payer: Self-pay | Admitting: Internal Medicine

## 2005-08-07 ENCOUNTER — Inpatient Hospital Stay (HOSPITAL_COMMUNITY): Admission: RE | Admit: 2005-08-07 | Discharge: 2005-08-13 | Payer: Self-pay | Admitting: Specialist

## 2005-08-08 ENCOUNTER — Ambulatory Visit: Payer: Self-pay | Admitting: Physical Medicine & Rehabilitation

## 2005-08-20 IMAGING — CR DG CERVICAL SPINE COMPLETE 4+V
6 series · 6 of 6 positions shown · non-contrast
Comparison: MR 11/02/2003

CLINICAL DATA: Neck pain, bilateral upper extremity numbness

CERVICAL SPINE - 5 VIEW

[view not recorded (1 of 6)]
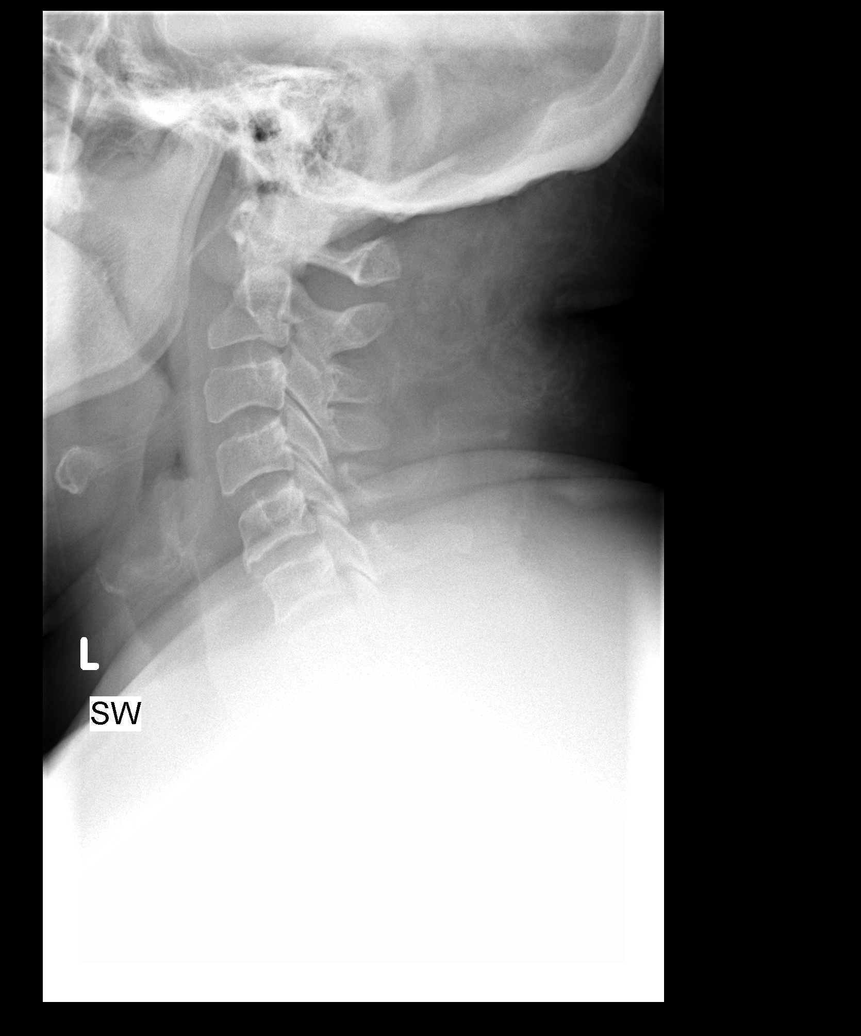

[view not recorded (2 of 6)]
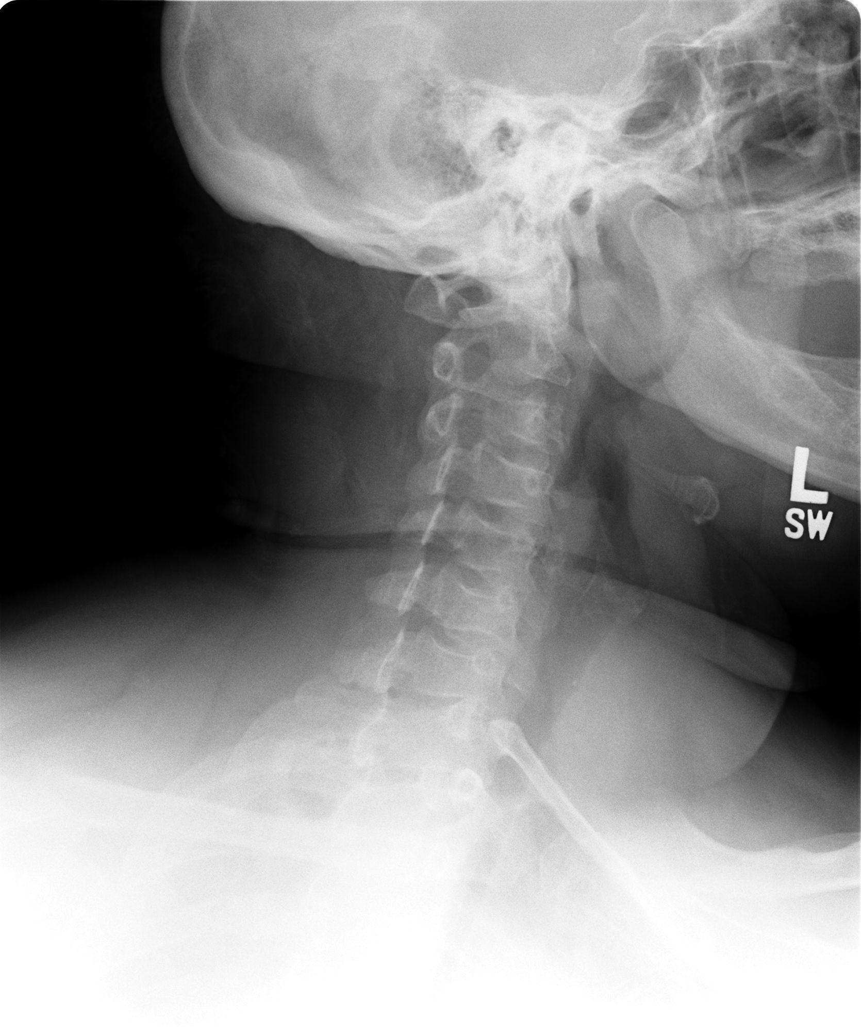

[view not recorded (3 of 6)]
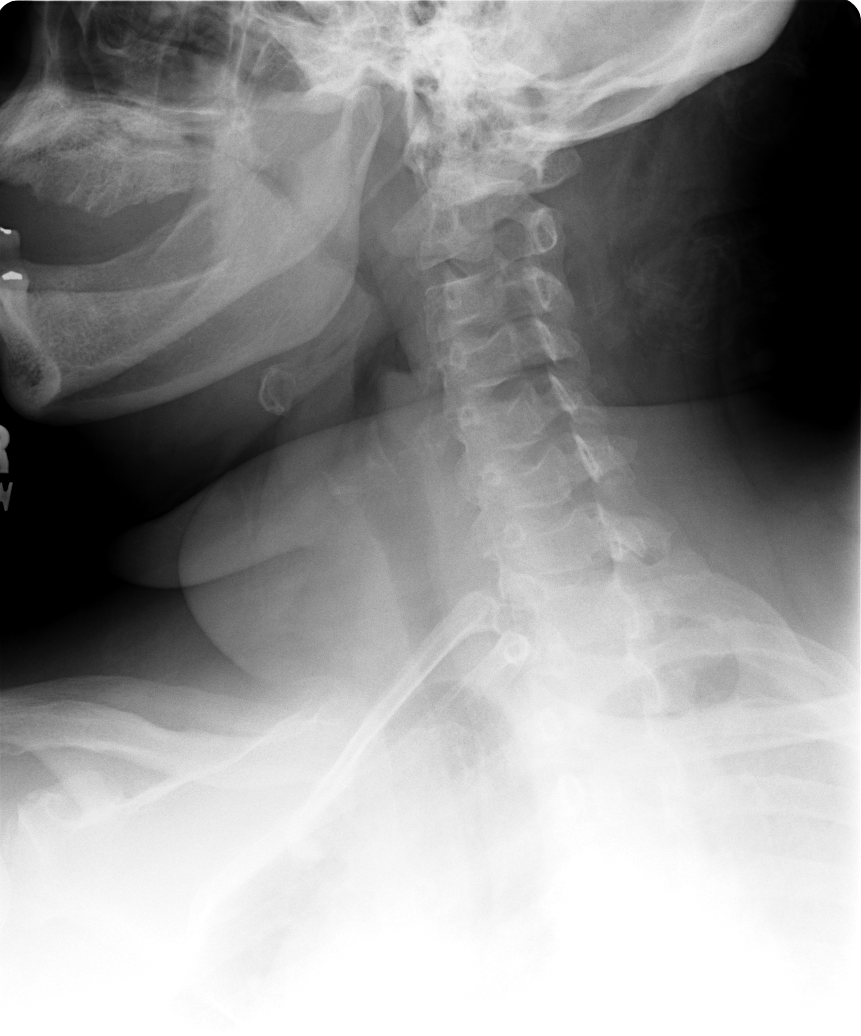

[view not recorded (4 of 6)]
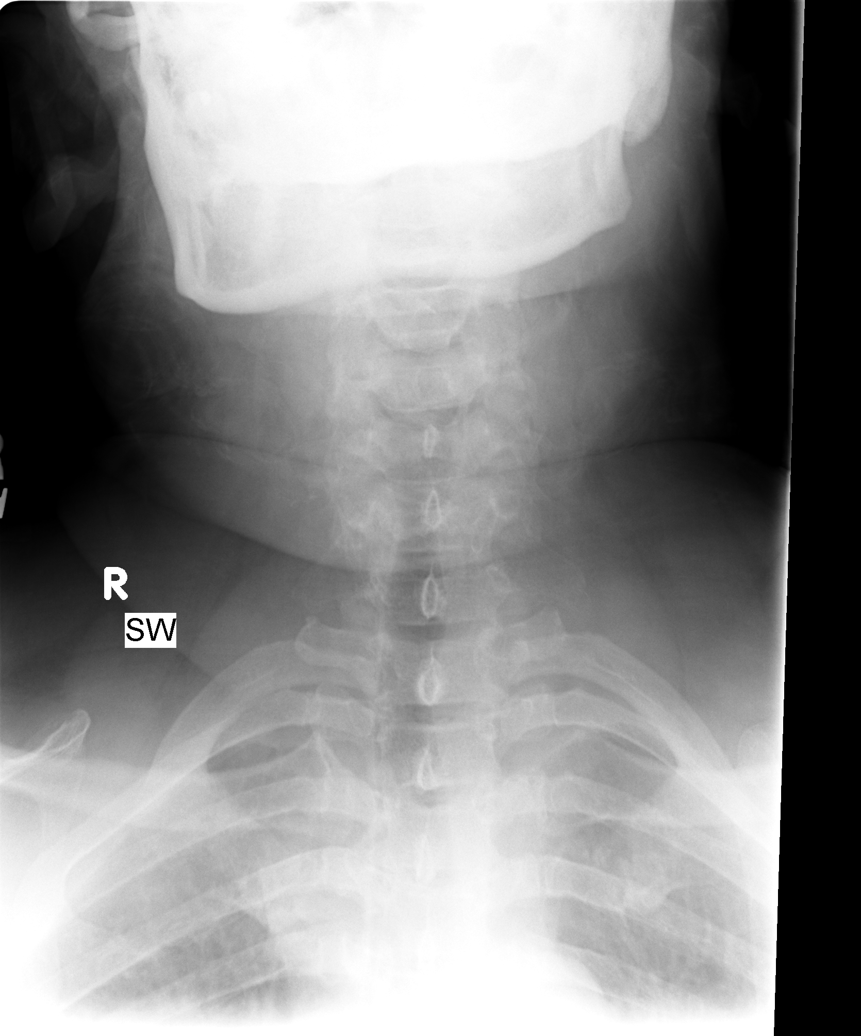

[view not recorded (5 of 6)]
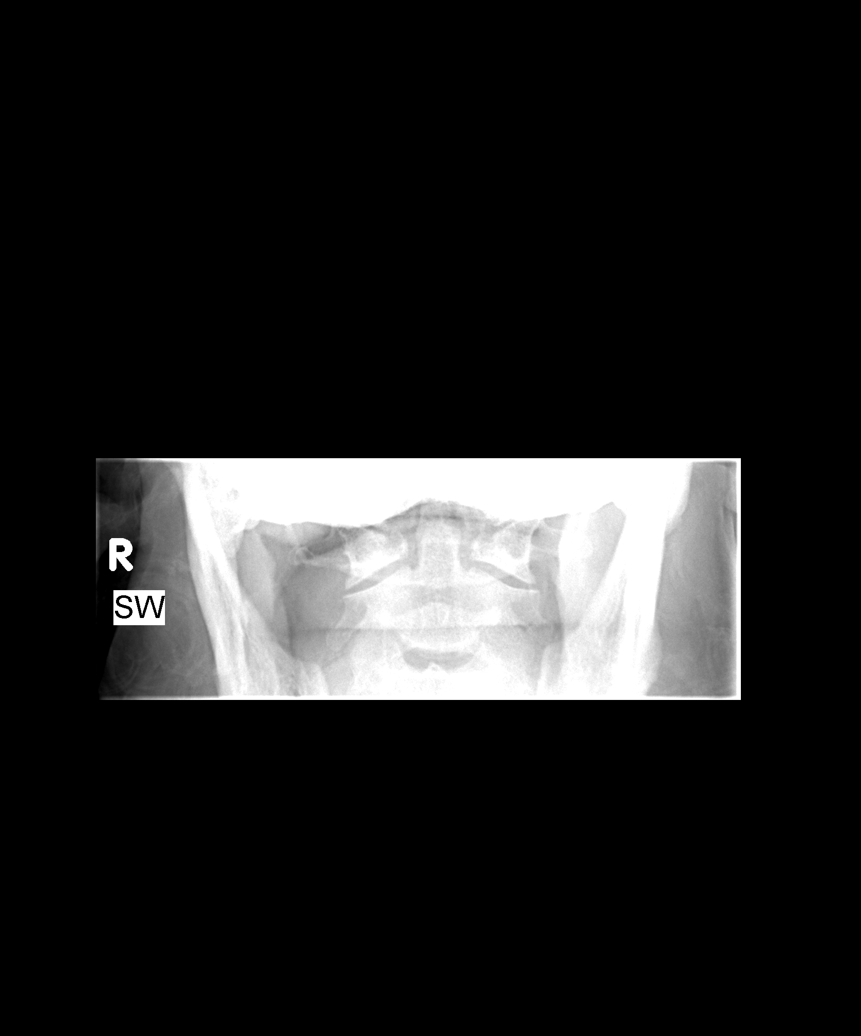

[view not recorded (6 of 6)]
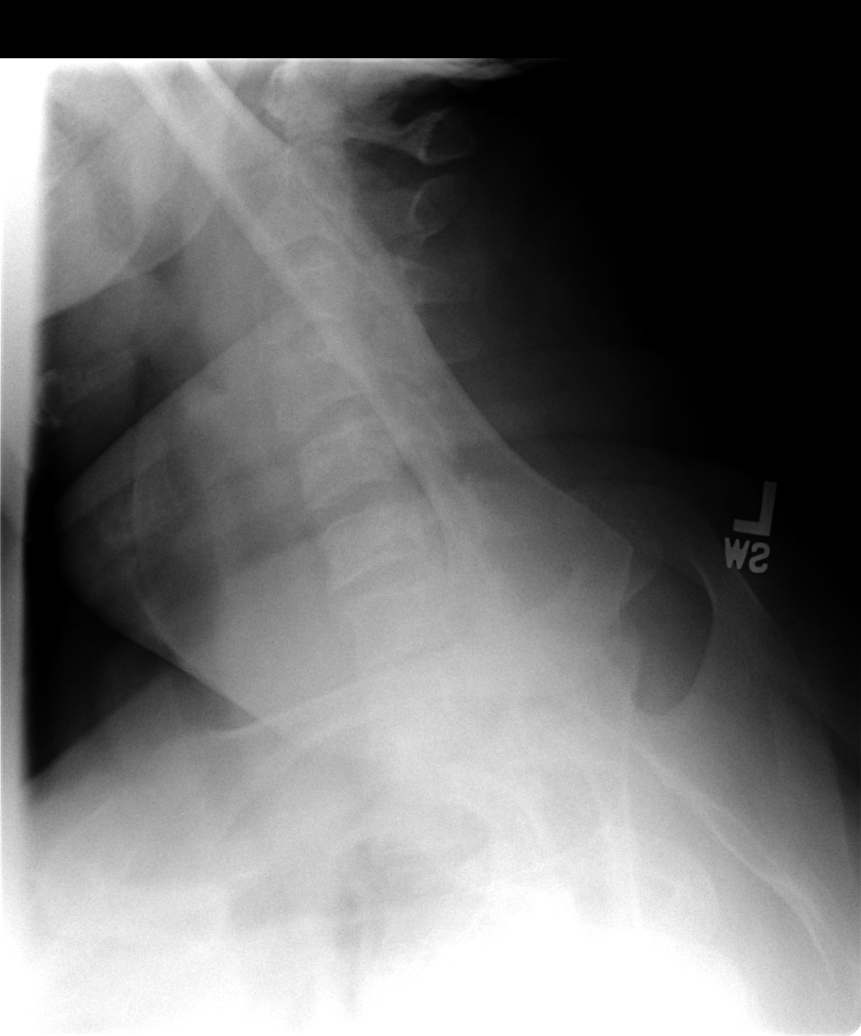

[6 of 6 positions shown; findings below may reference images not displayed]

FINDINGS: There is mild cervical spondylosis. Disc space narrowing and
osteophyte formation present at C5-C6. Mild facet disease present. No evidence
of fracture or malalignment. Prevertebral soft tissues normal.

IMPRESSION

Cervical spondylosis. No acute findings.

## 2005-11-14 ENCOUNTER — Encounter: Admission: RE | Admit: 2005-11-14 | Discharge: 2005-11-14 | Payer: Self-pay | Admitting: Internal Medicine

## 2005-11-24 ENCOUNTER — Inpatient Hospital Stay (HOSPITAL_COMMUNITY): Admission: AD | Admit: 2005-11-24 | Discharge: 2005-11-27 | Payer: Self-pay | Admitting: Cardiovascular Disease

## 2005-11-25 ENCOUNTER — Encounter (INDEPENDENT_AMBULATORY_CARE_PROVIDER_SITE_OTHER): Payer: Self-pay | Admitting: Cardiovascular Disease

## 2005-12-03 ENCOUNTER — Ambulatory Visit: Payer: Self-pay | Admitting: Gastroenterology

## 2005-12-08 ENCOUNTER — Ambulatory Visit: Payer: Self-pay | Admitting: Cardiology

## 2006-01-04 IMAGING — CR DG CHEST 1V PORT
1 series · 1 of 1 positions shown · non-contrast
Comparison: 12/06/03.

CLINICAL DATA: Dyspnea.  Weakness.
PORTABLE CHEST:

[view not recorded]
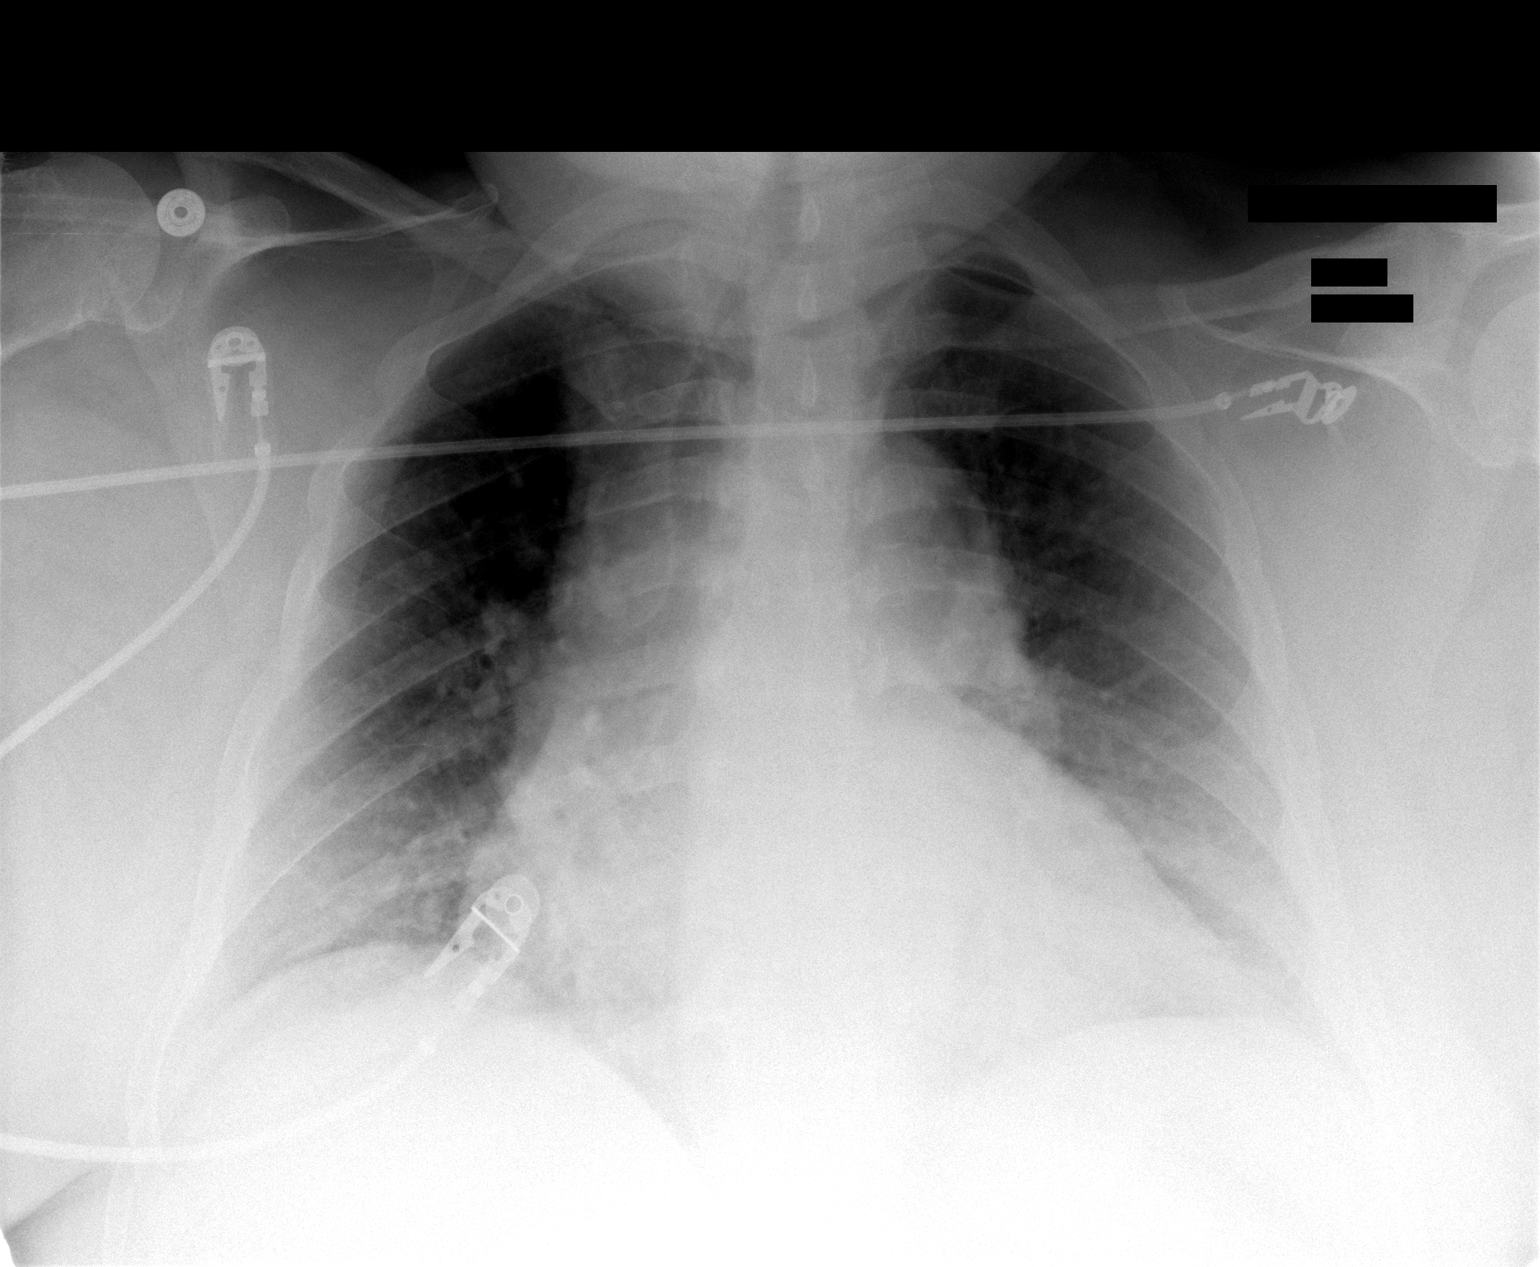

[1 of 1 positions shown; findings below may reference images not displayed]

Cardiomegaly and pulmonary vascular congestion noted without edema.  Fullness of the superior mediastinum is unchanged.  No evidence of effusions or pneumothorax.
IMPRESSION: Cardiomegaly and vascular congestion.

## 2006-01-05 IMAGING — CR DG CHEST 1V PORT
2 series · 2 of 2 positions shown · non-contrast
Comparison: 08/27/04.

CLINICAL DATA: Shortness of breath, emphysema.  
 PORTABLE CHEST ? 08/28/04:

[view not recorded (1 of 2)]
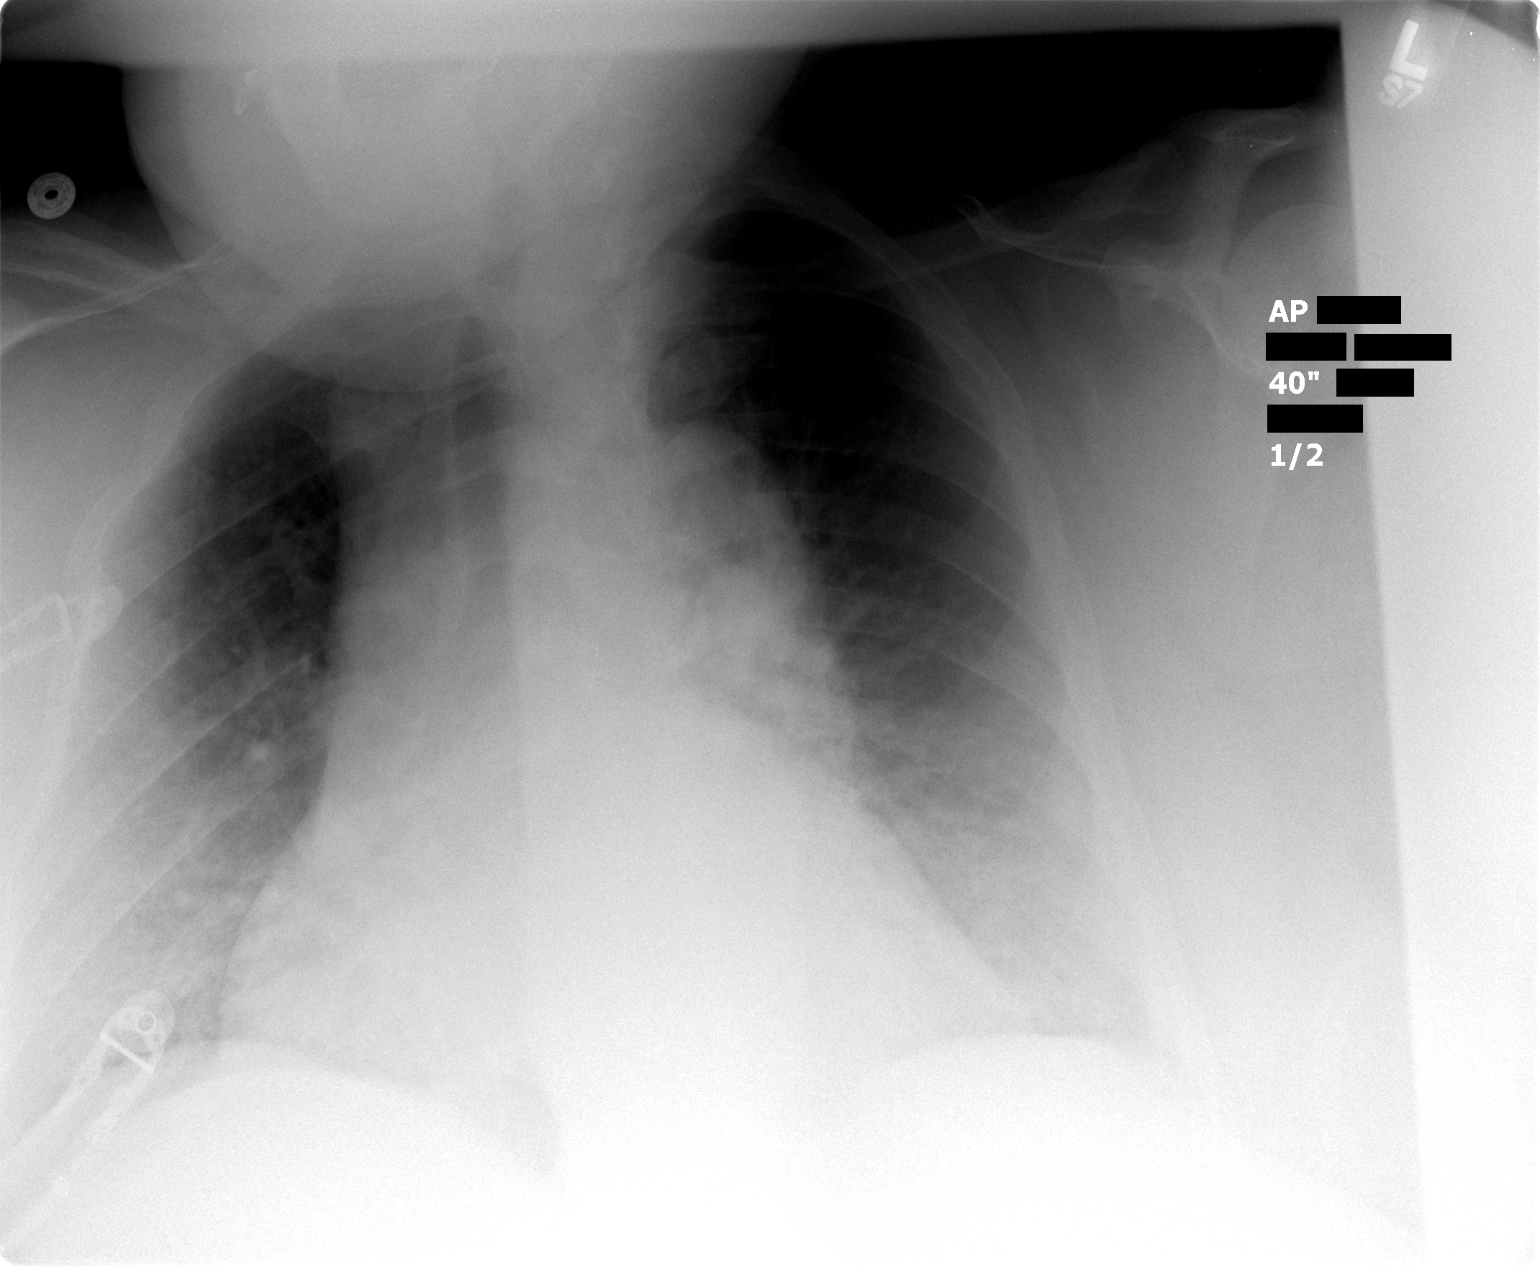

[view not recorded (2 of 2)]
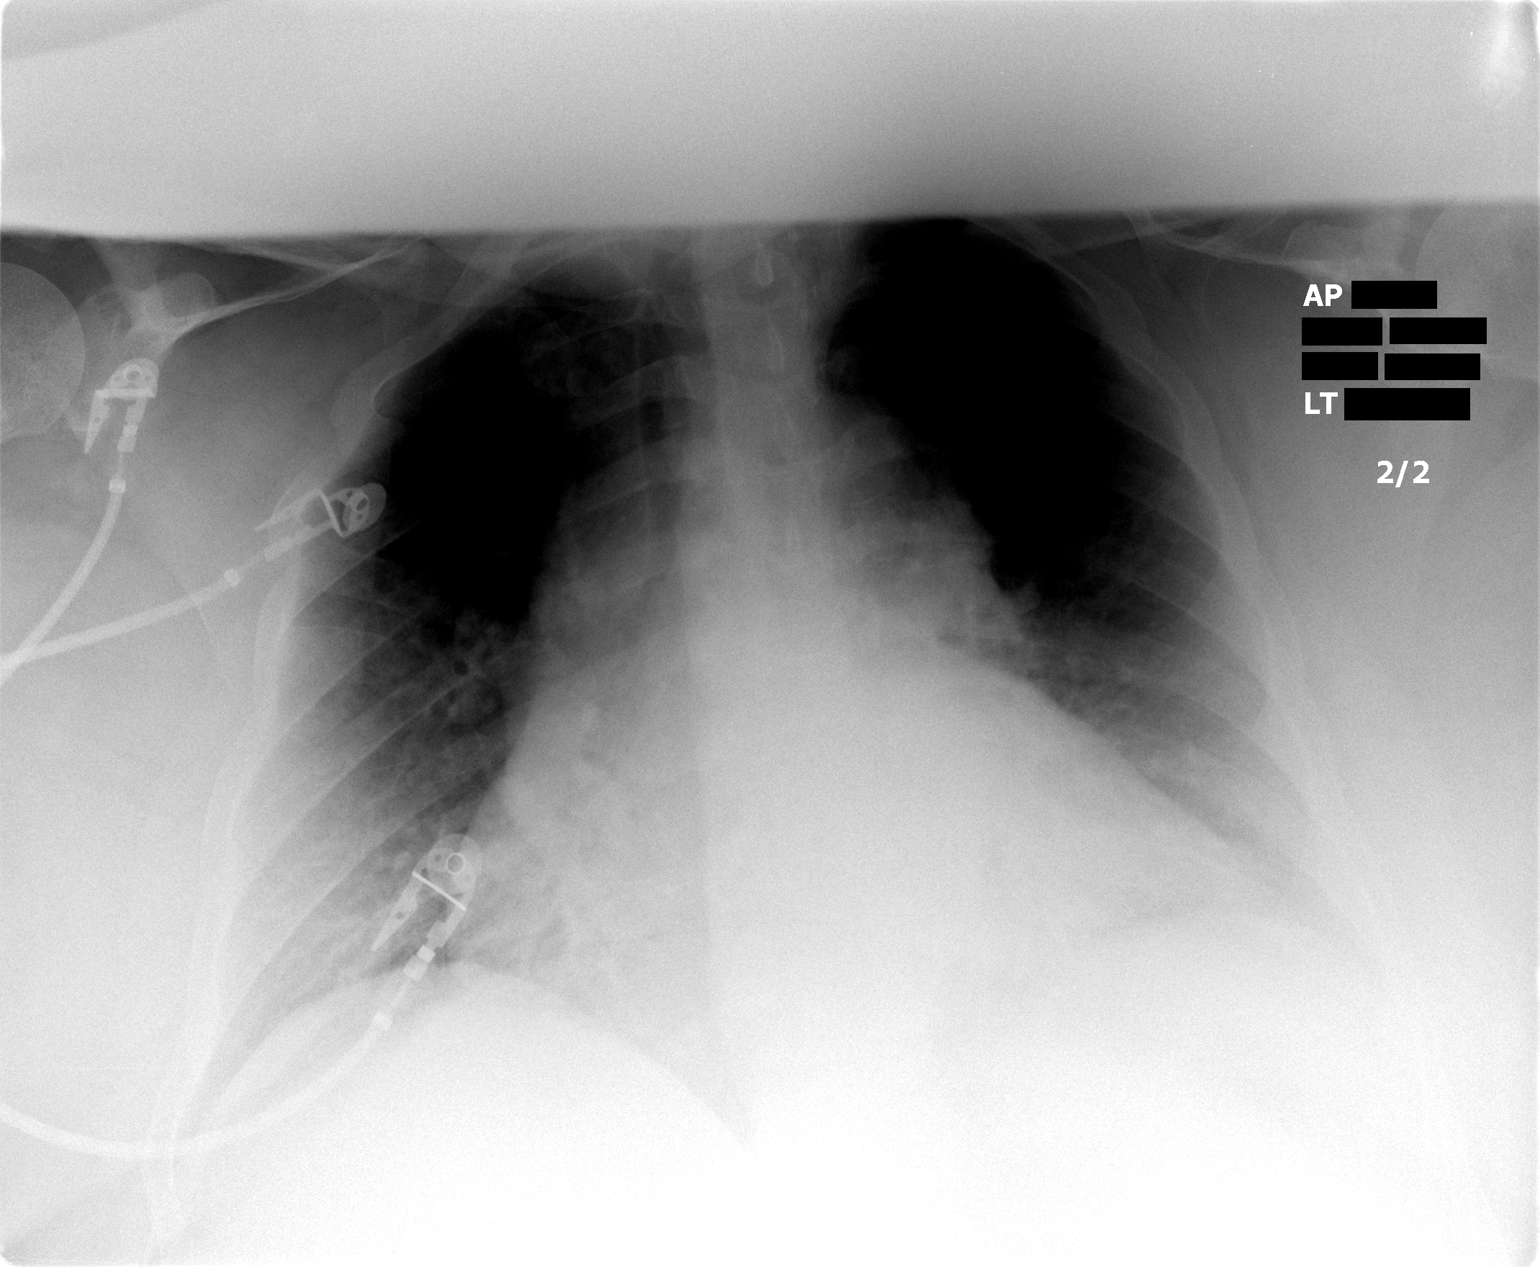

[2 of 2 positions shown; findings below may reference images not displayed]

FINDINGS: Cardiomegaly is present. No edema.  The lungs appear clear.   The exam is mildly limited due to body habitus.
IMPRESSION: 1.  Cardiomegaly.

## 2006-01-08 IMAGING — CR DG CHEST 1V PORT
1 series · 1 of 1 positions shown · non-contrast
Comparison: 08/28/04.

CLINICAL DATA: Hyperglycemia. 
 PORTABLE CHEST ? 1 VIEW, 08/31/04:

[view not recorded]
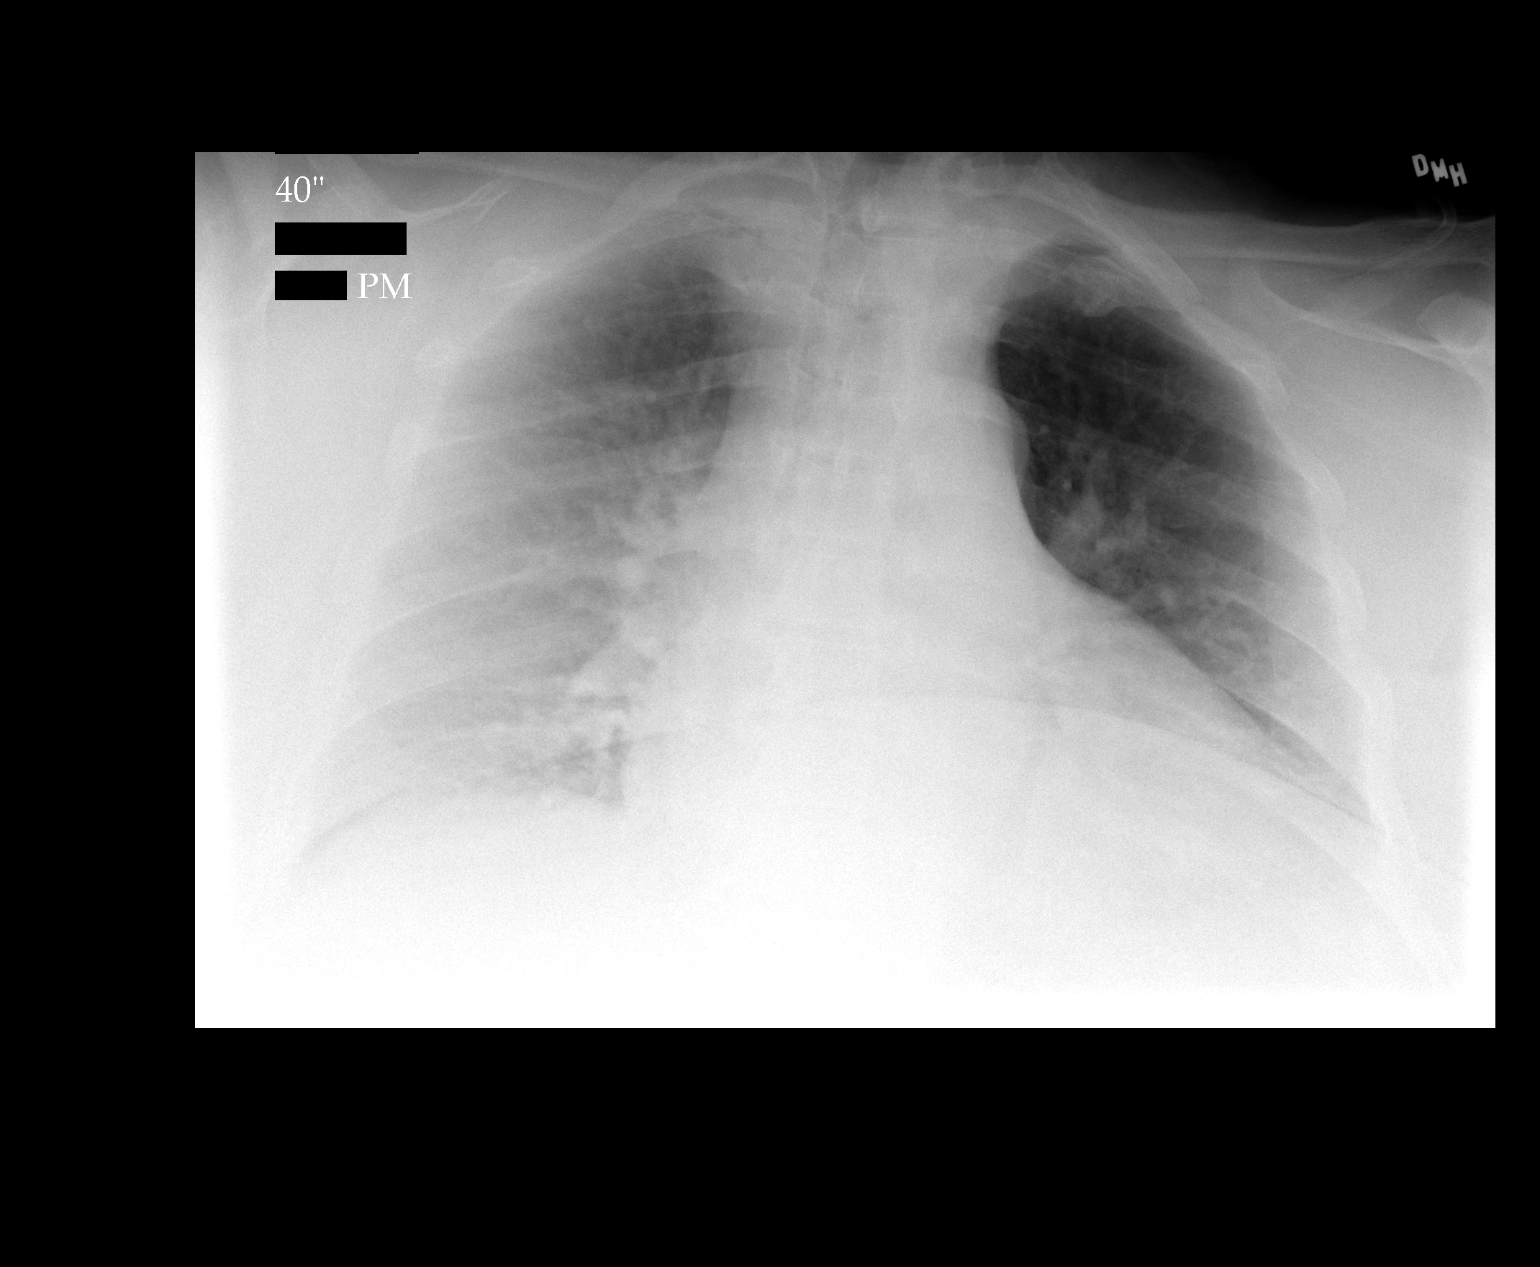

[1 of 1 positions shown; findings below may reference images not displayed]

Cardiac enlargement with left ventricular hypertrophic change.   Vascular congestion without frank edema.   Left lower lobe atelectasis.
IMPRESSION: Cardiac enlargement with left lower lobe atelectasis.

## 2006-01-12 IMAGING — CT CT HEAD W/O CM
1 series · 16 of 30 positions shown, 20 images · IV contrast (agent unspecified)
Comparison: 06/03/03.

CLINICAL DATA: Acute bilateral leg weakness and numbness. 
HEAD CT WITHOUT CONTRAST:
TECHNIQUE: Collimated images were obtained from the base of the skull through the vertex according to standard protocol without contrast.

[Series 2: brain · axial · 0.47mm/px · z∈[+129,+270]mm · 16 of 30 slices shown, 20 images]
[im 2/30  brain]
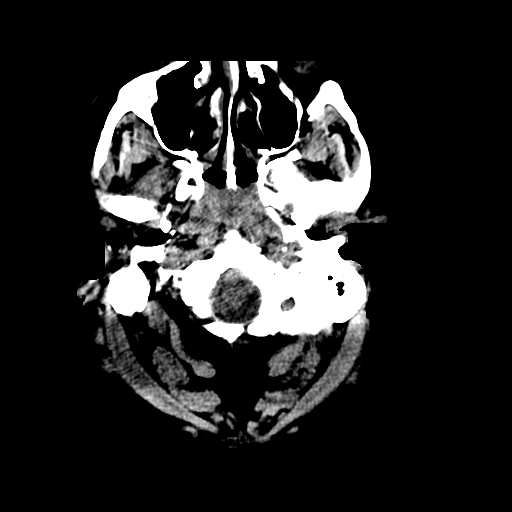
[im 2/30  bone]
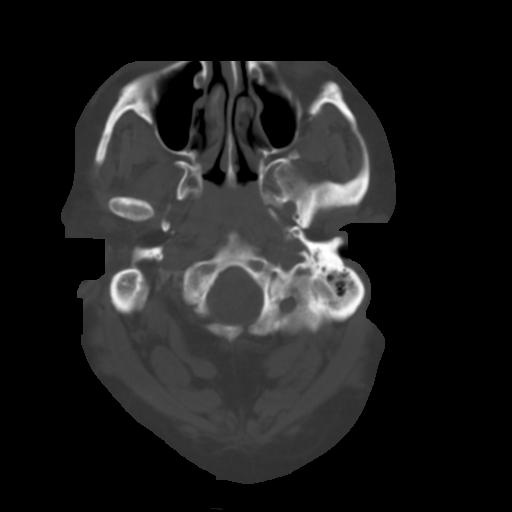
[im 4/30  brain]
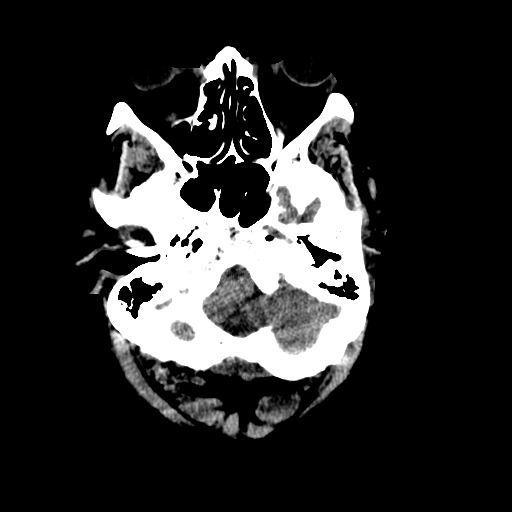
[im 6/30  brain]
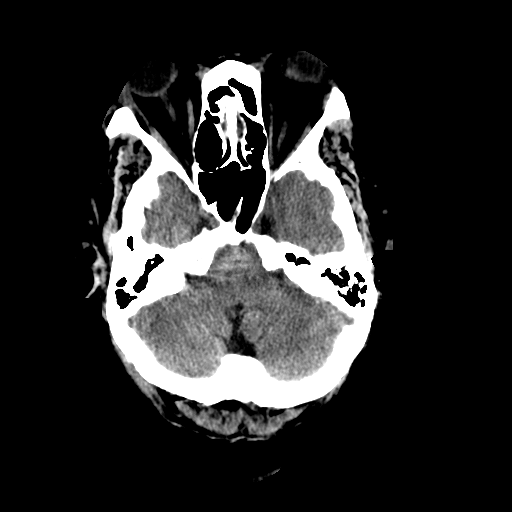
[im 8/30  brain]
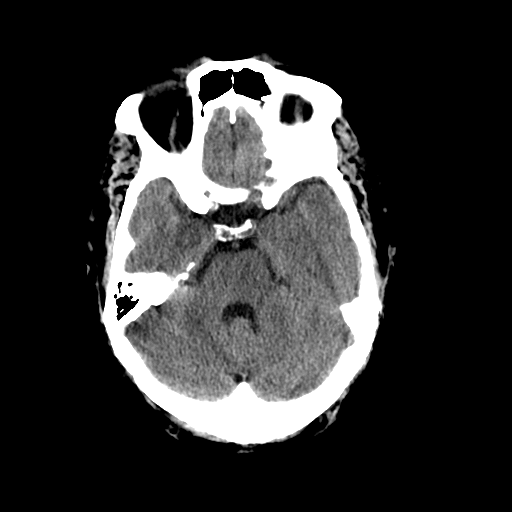
[im 9/30  brain]
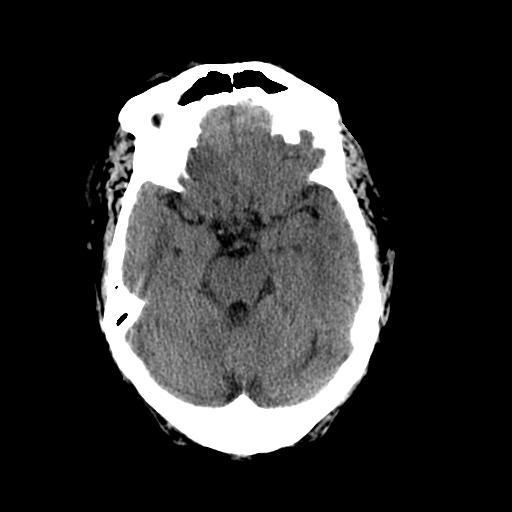
[im 9/30  bone]
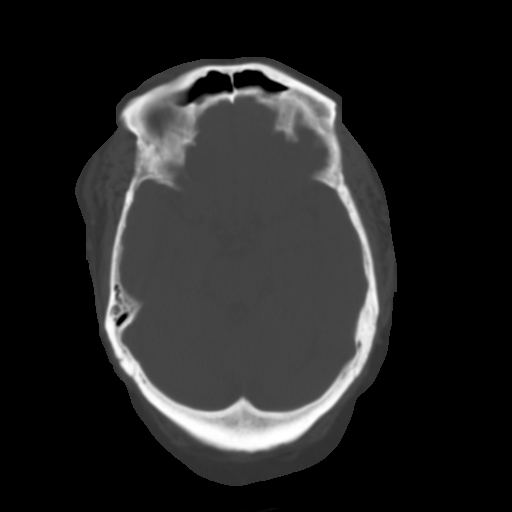
[im 11/30  brain]
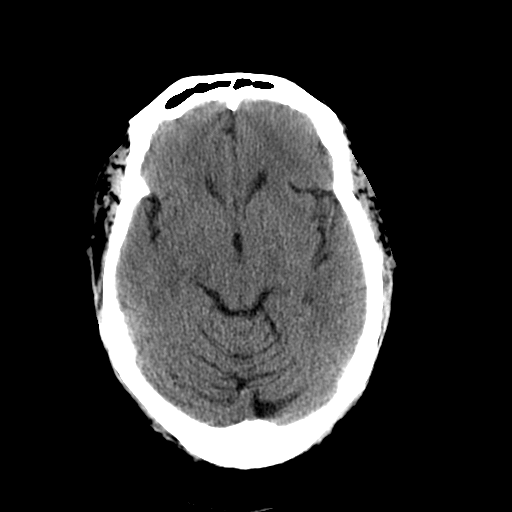
[im 13/30  brain]
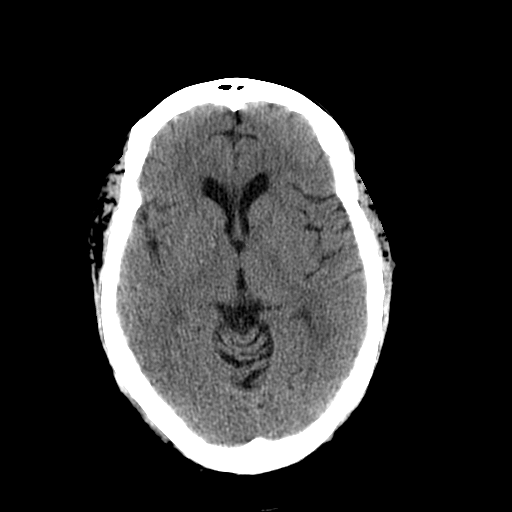
[im 15/30  brain]
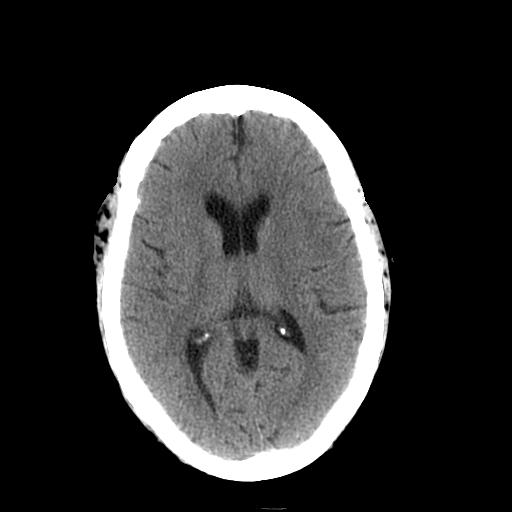
[im 16/30  brain]
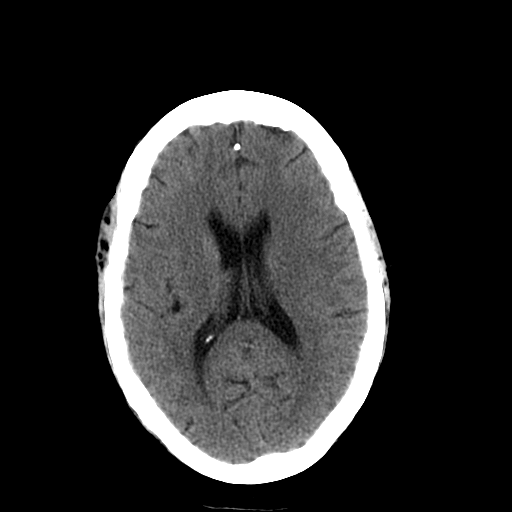
[im 16/30  bone]
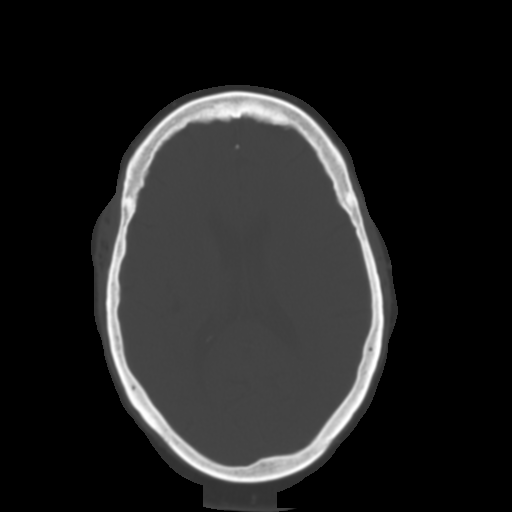
[im 18/30  brain]
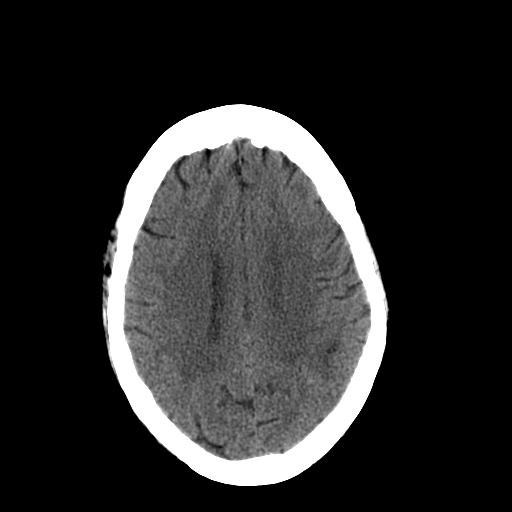
[im 20/30  brain]
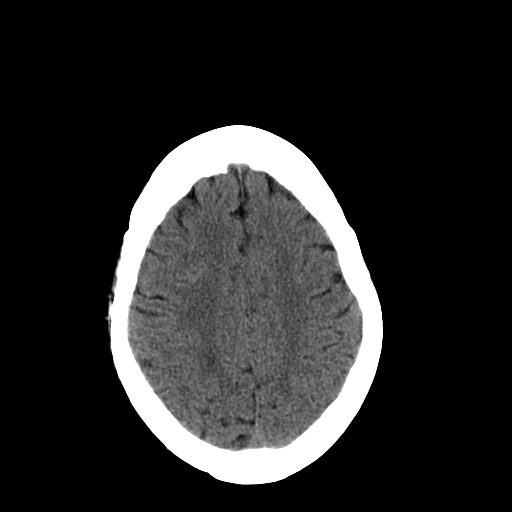
[im 22/30  brain]
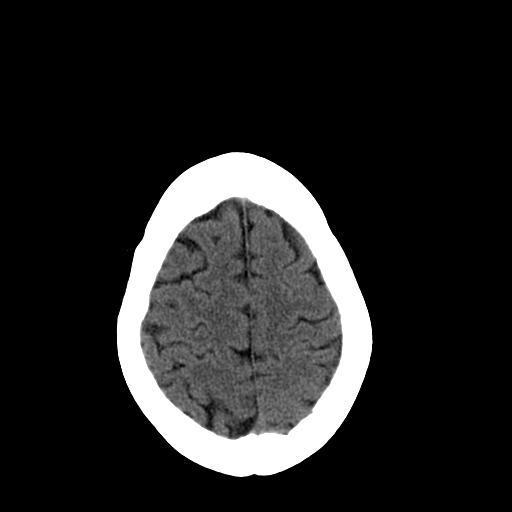
[im 23/30  brain]
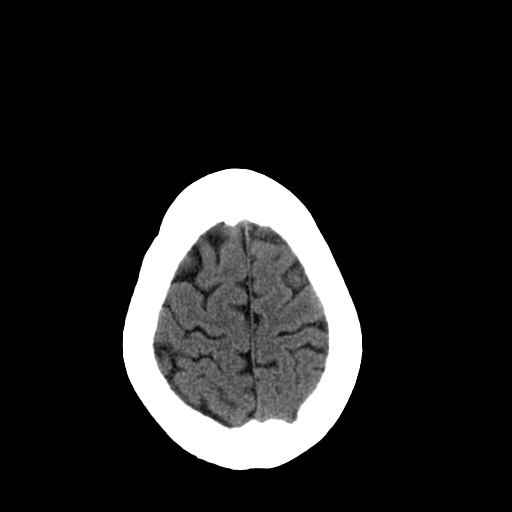
[im 23/30  bone]
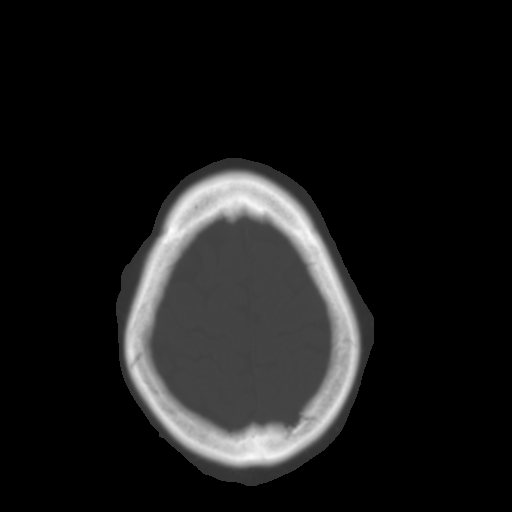
[im 25/30  brain]
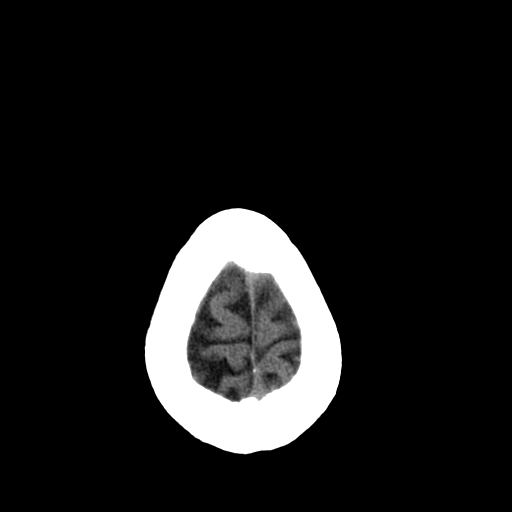
[im 27/30  brain]
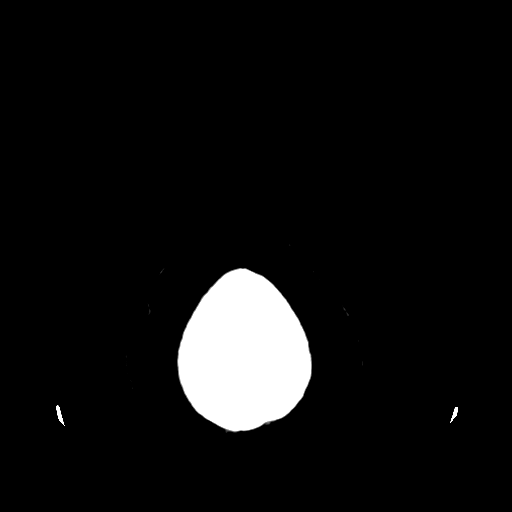
[im 29/30  brain]
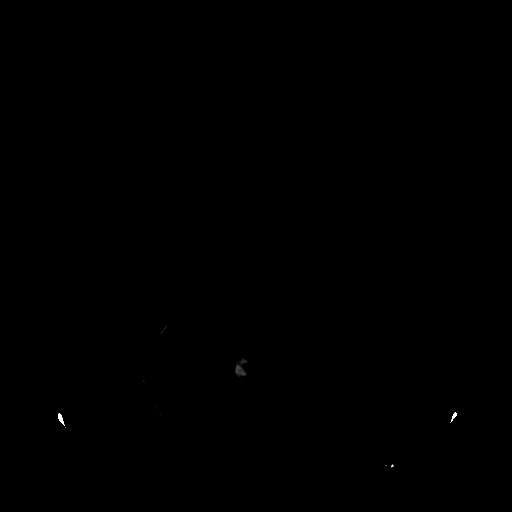

[16 of 30 positions shown; findings below may reference images not displayed]

FINDINGS: ild periventricular subcortical white matter disease is greatest in the posterior parietal regions.  No evidence of acute intracranial abnormality, including mass or mass effect, hydrocephalus, extraaxial fluid collection, midline shift, hemorrhage, or acute infarct.  Acute infarct may be missed by CT for 24 to 48 hours.  Visualized bony calvarium and paranasal sinuses are within normal limits.
IMPRESSION: No evidence of acute intracranial abnormality.

## 2006-01-14 IMAGING — CR DG ABDOMEN 1V
2 series · 2 of 2 positions shown · non-contrast
Comparison: none

CLINICAL DATA: Hyperglycemia with left lower quadrant soreness for two weeks.
 ABDOMEN ? 1 VIEW:
 Two supine views were obtained due to the patient?s size.  Correlation is made with a CT done 09/27/03.
 The bowel gas pattern is nonobstructive.  There is no supine evidence of free intraperitoneal air.  Pelvic calcifications are similar to that demonstrated previously compatible with phleboliths.  There are no suspicious calcifications.

[t abdomen supine (1 of 2)]
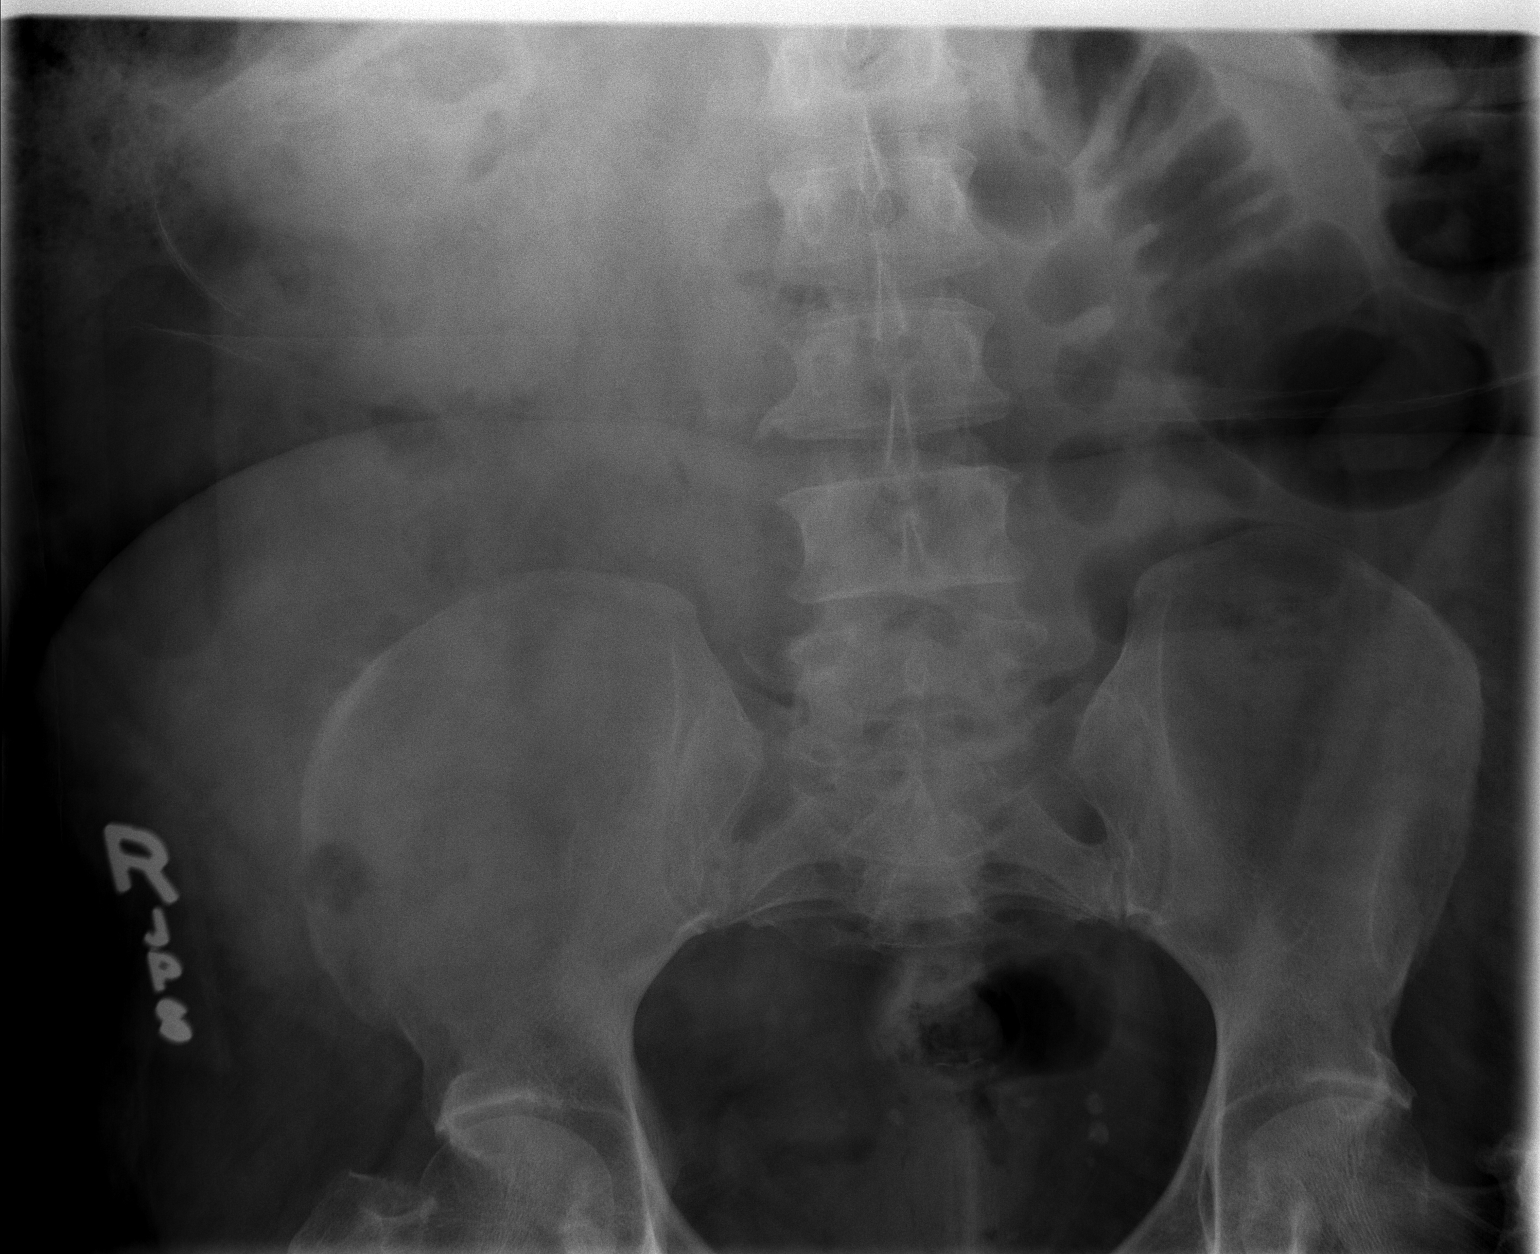

[t abdomen supine (2 of 2)]
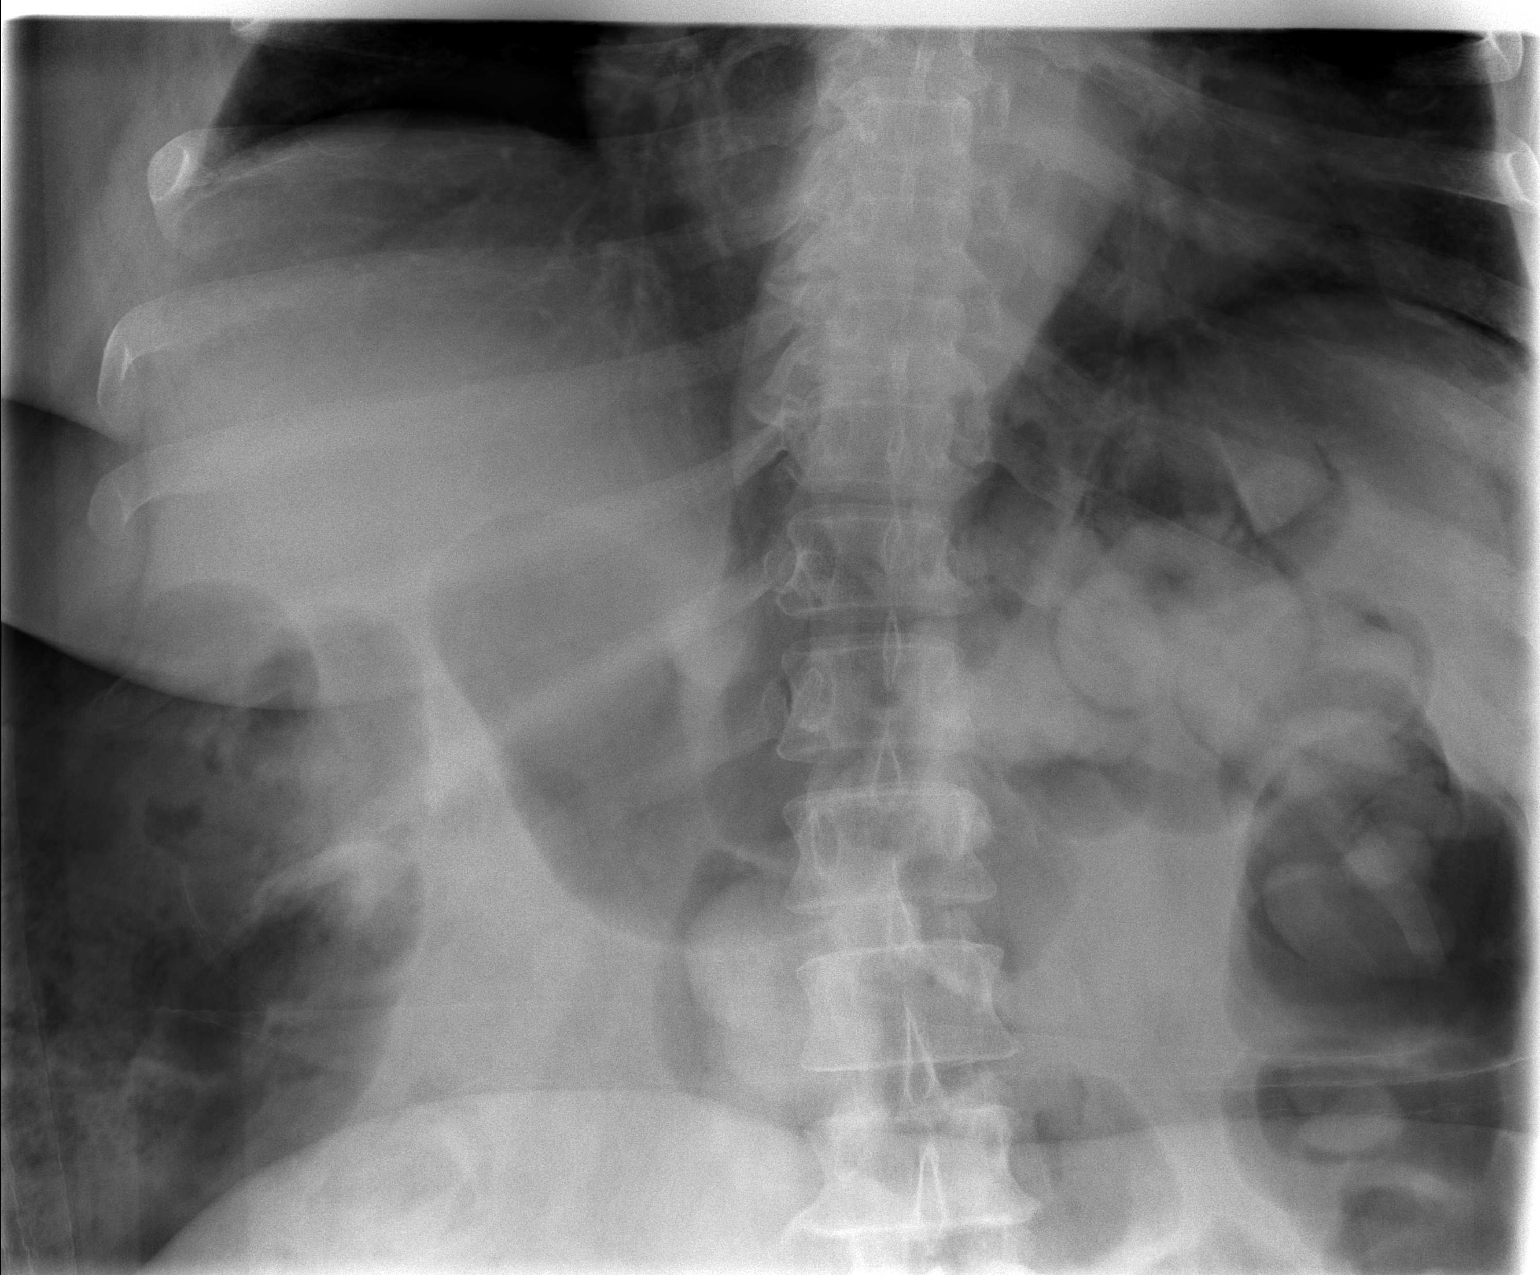

[2 of 2 positions shown; findings below may reference images not displayed]

IMPRESSION: Nonobstructive bowel gas pattern.

## 2006-01-26 ENCOUNTER — Emergency Department (HOSPITAL_COMMUNITY): Admission: EM | Admit: 2006-01-26 | Discharge: 2006-01-26 | Payer: Self-pay | Admitting: Emergency Medicine

## 2006-03-11 ENCOUNTER — Ambulatory Visit (HOSPITAL_COMMUNITY): Admission: RE | Admit: 2006-03-11 | Discharge: 2006-03-11 | Payer: Self-pay | Admitting: Internal Medicine

## 2006-04-27 IMAGING — CR DG KNEE 1-2V*L*
2 series · 2 of 2 positions shown · non-contrast
Comparison: none

CLINICAL DATA: Preop osteoarthritis.  
 LEFT KNEE ? 2 VIEWS (STANDING):

[view not recorded (1 of 2)]
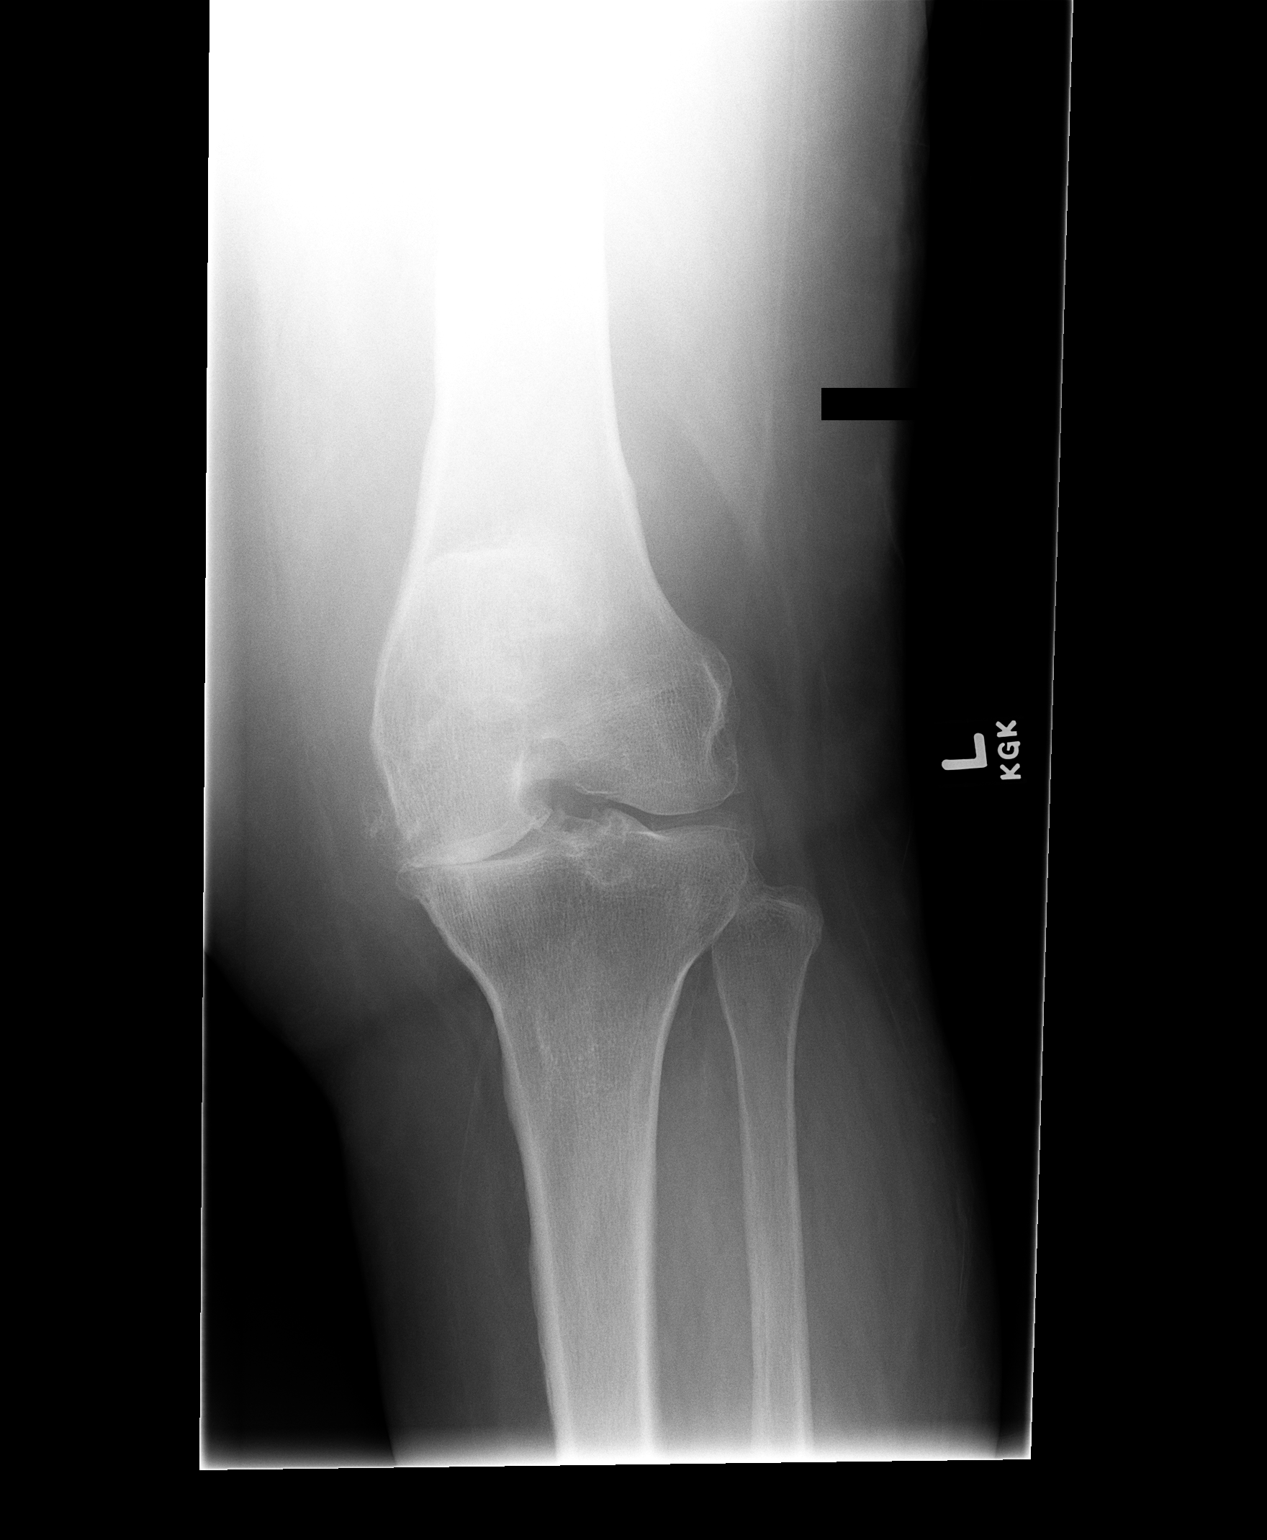

[view not recorded (2 of 2)]
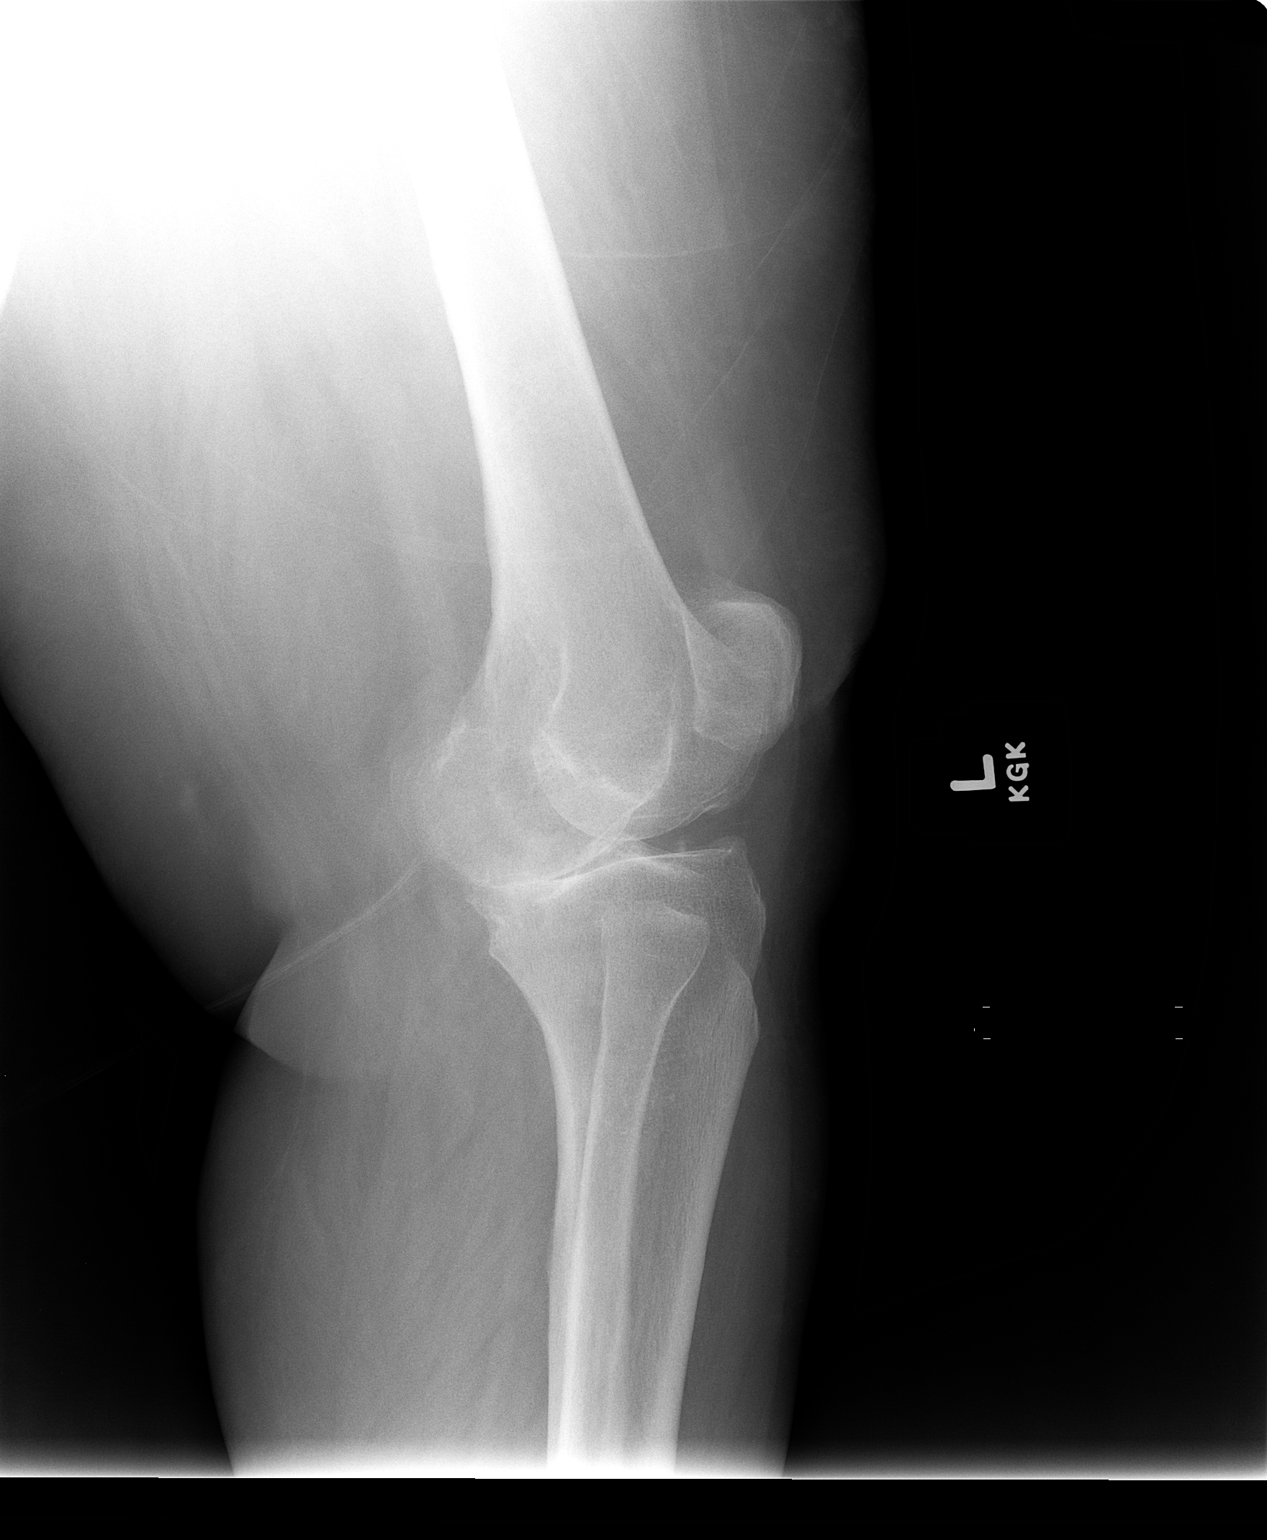

[2 of 2 positions shown; findings below may reference images not displayed]

FINDINGS: There is severe narrowing of the medial compartment of the knee joint with bone on bone.  Osteophyte formation is present about all three compartments.  No acute fractures or dislocations are seen.  Mild associated varus deformity is present.  Increased body habitus is noted.
IMPRESSION: Severe osteoarthritis of the medial compartment of the left knee.

## 2006-05-05 IMAGING — CR DG CHEST 2V
2 series · 2 of 2 positions shown · non-contrast
Comparison: none

CLINICAL DATA: Dyspnea, chest pain, and fever.
 CHEST - 2 VIEW:

[w chest lat *]
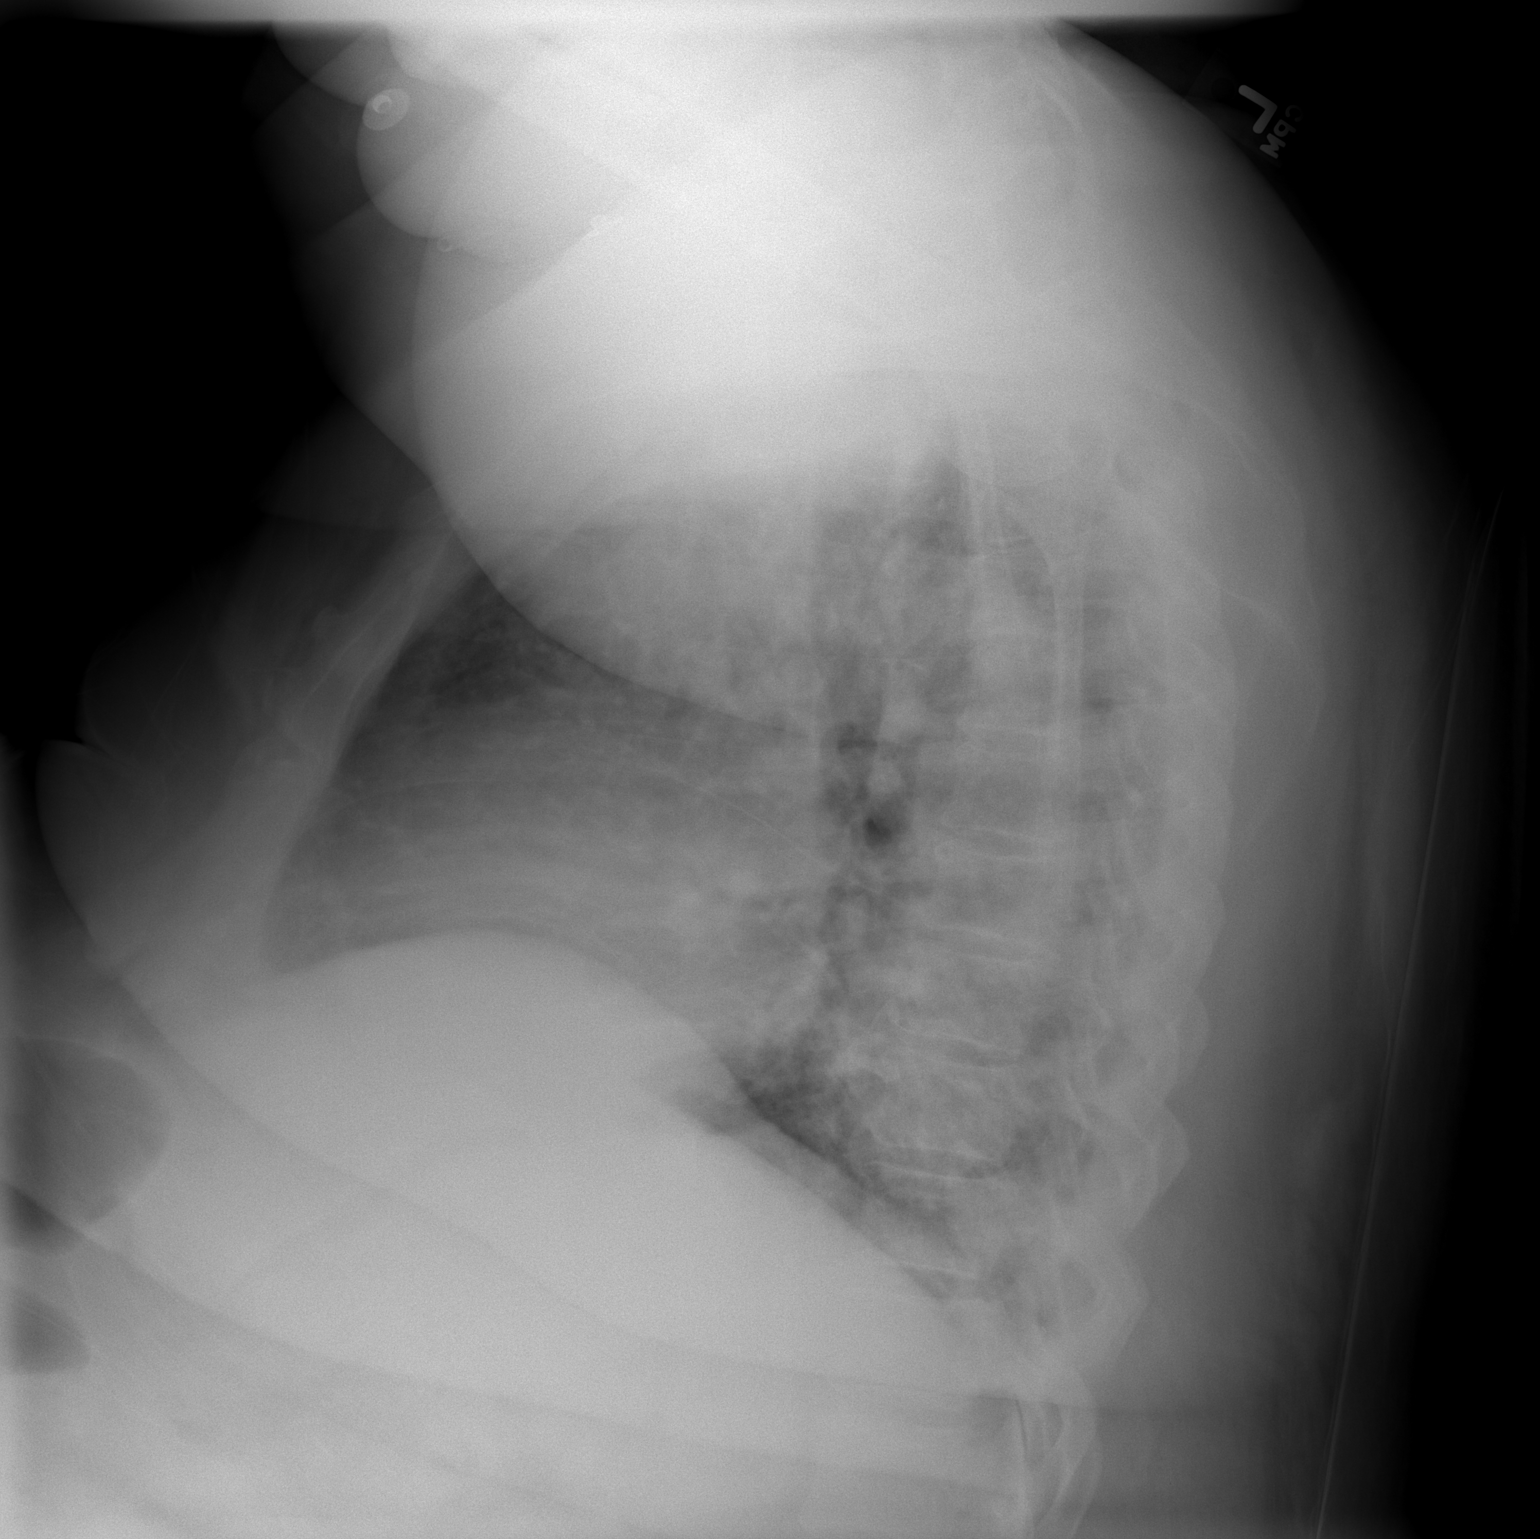

[view not recorded]
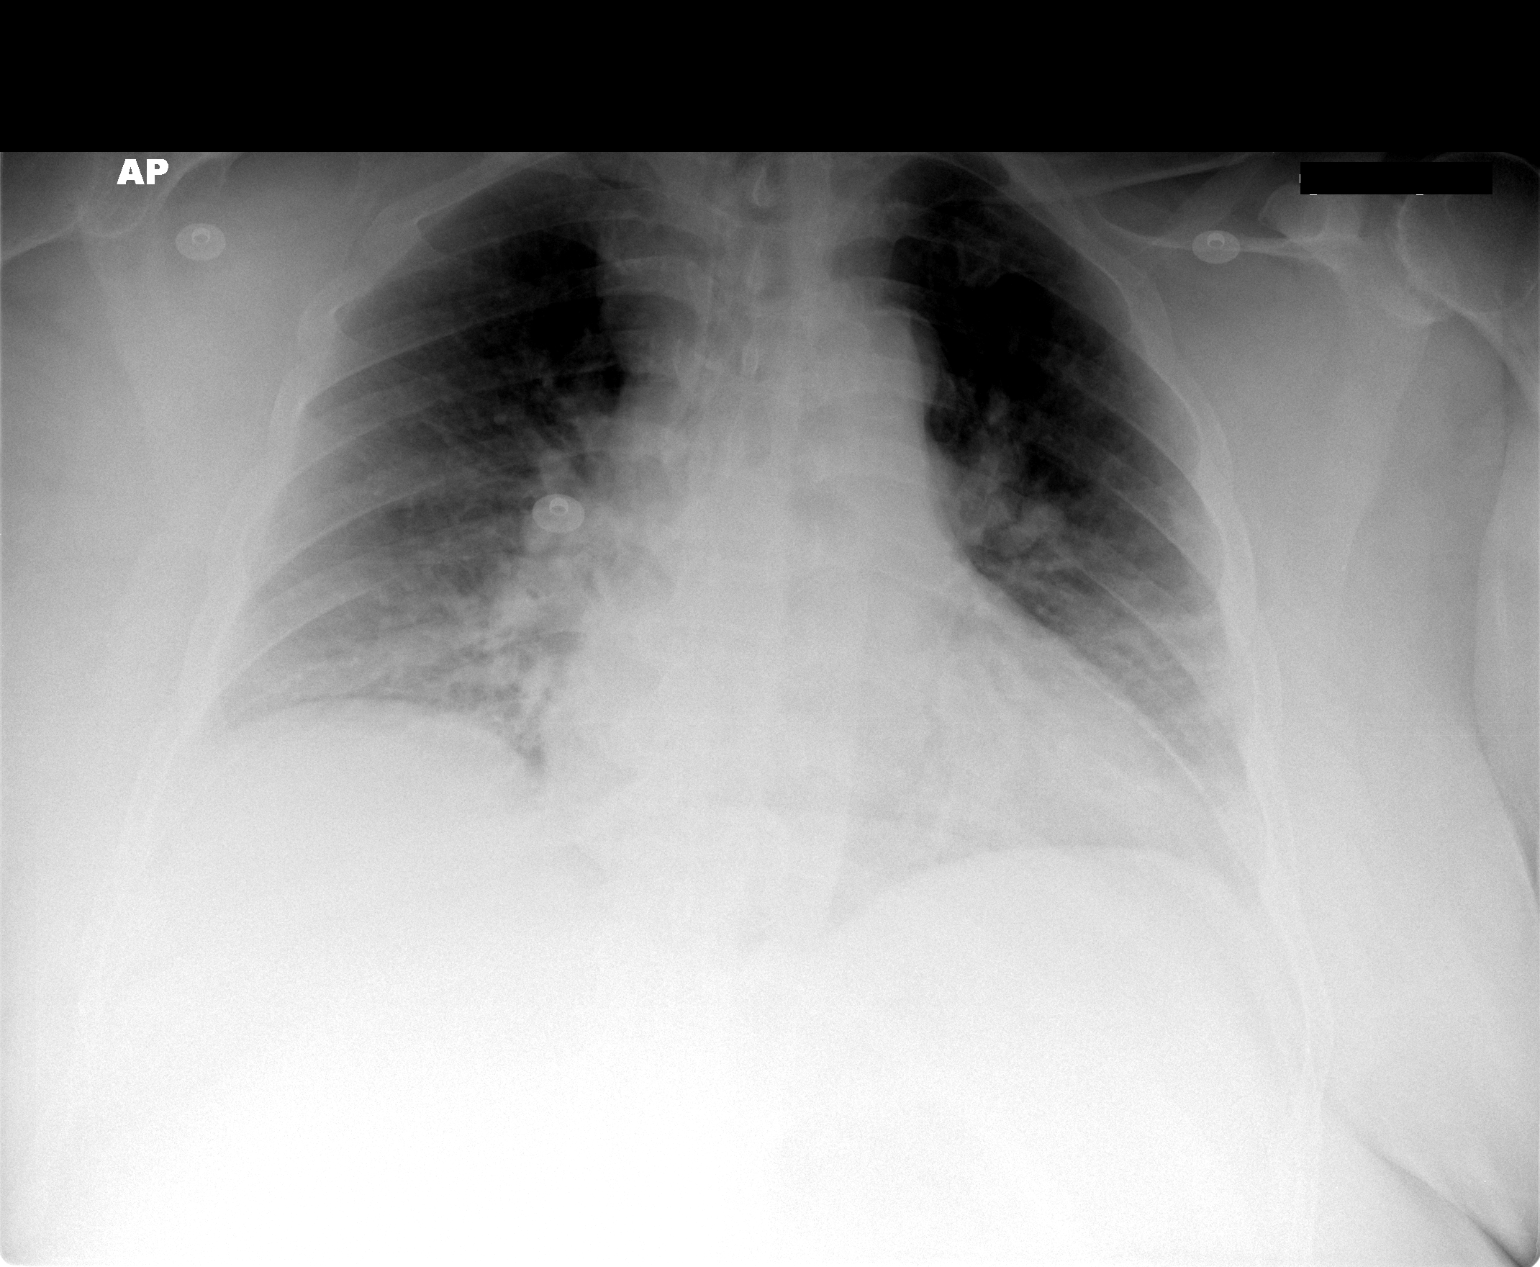

[2 of 2 positions shown; findings below may reference images not displayed]

FINDINGS: Cardiomegaly and pulmonary vascular congestion are noted.  Interstitial opacities are compatible with either interstitial edema or infection.  Subsegmental atelectasis in the lower lungs is noted.  No definite pleural effusions are present.
IMPRESSION: 1.  Cardiomegaly and pulmonary vascular congestion.  Mild interstitial prominence is compatible with mild interstitial edema versus infection.
 2.  Mild lower lung atelectasis.

## 2006-05-06 IMAGING — CR DG CHEST 1V PORT
1 series · 1 of 1 positions shown · non-contrast
Comparison: Earlier in the morning.

CLINICAL DATA: Pneumonia. Follow-up.
 PORTABLE [id number] VIEW:

[view not recorded]
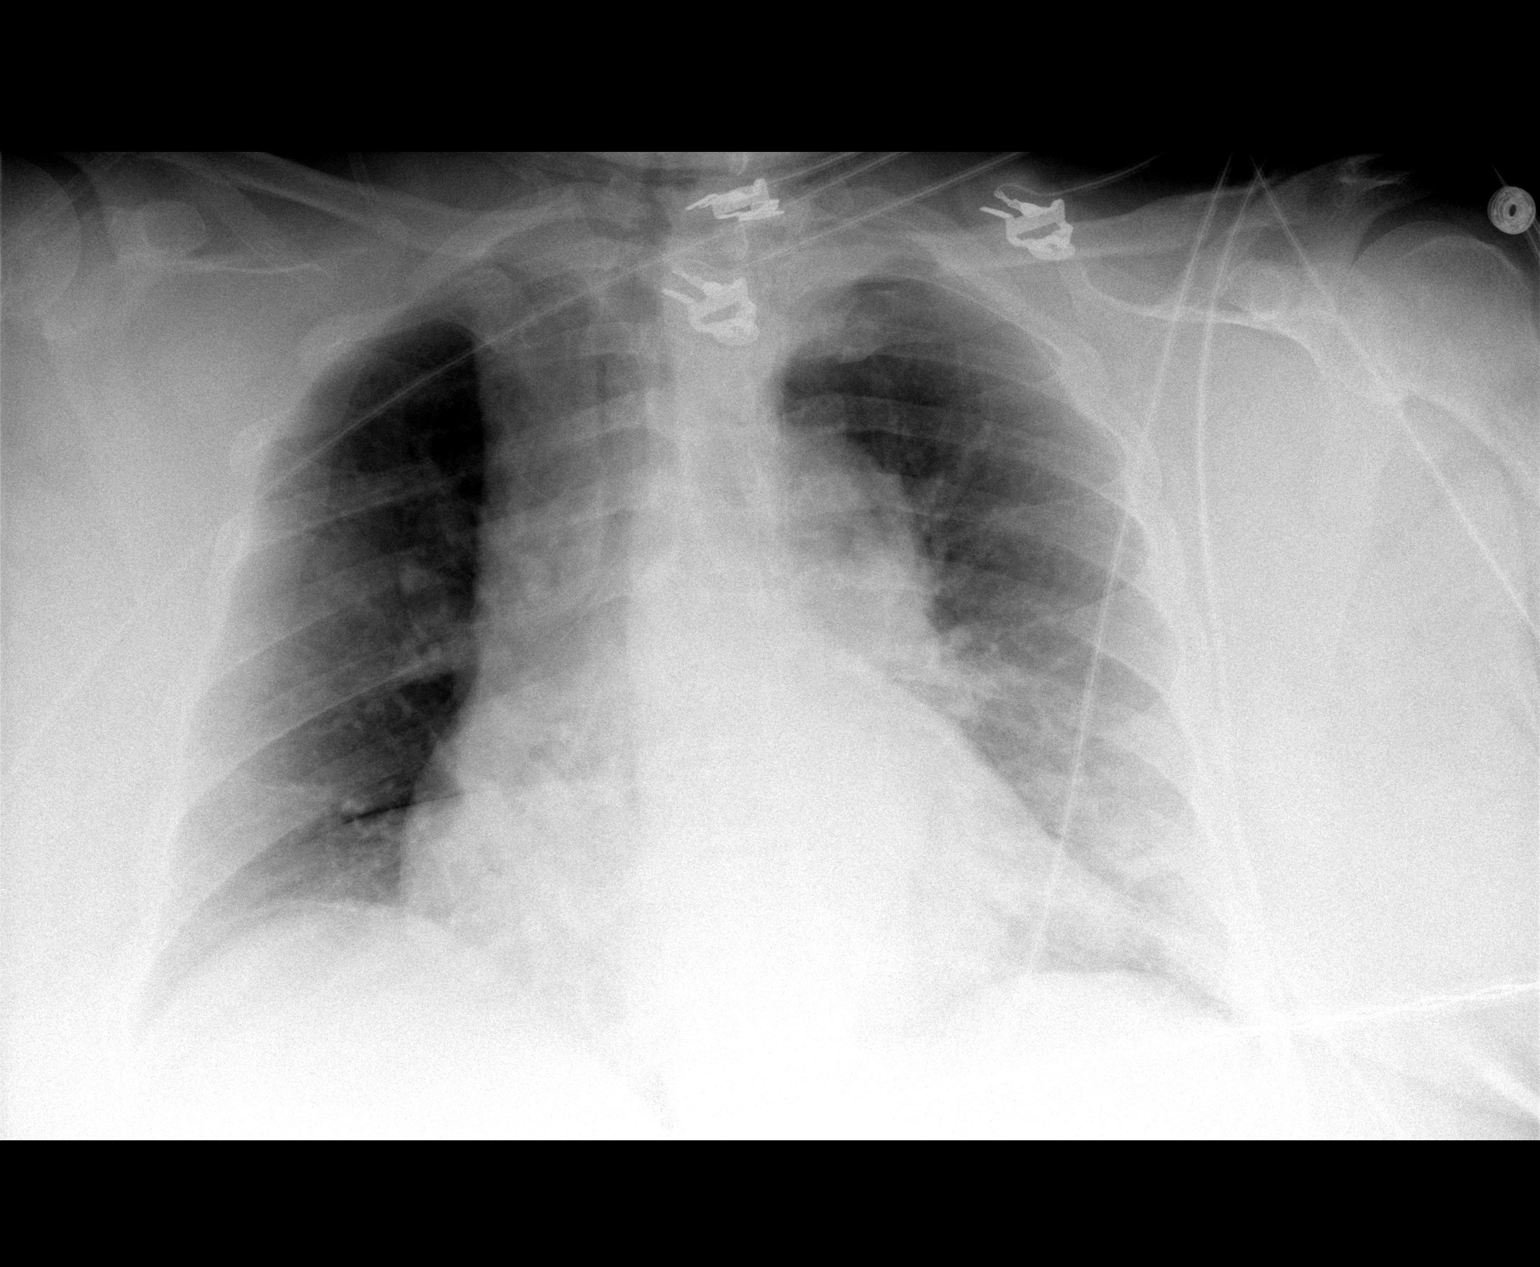

[1 of 1 positions shown; findings below may reference images not displayed]

FINDINGS: The patient is rotated to the right.  Heart remains mildly enlarged.  There is improvement in pulmonary vascular congestion.  Improved right base aeration with minimal residual bibasilar atelectasis.  No significant pleural fluid.  No pneumothorax.
IMPRESSION: Overall slight improvement in lung aeration secondary to resolving pulmonary venous congestion and decreased bibasilar atelectasis.  Cardiomegaly persists.

## 2006-05-06 IMAGING — CR DG CHEST 1V PORT
1 series · 1 of 1 positions shown · non-contrast
Comparison: 12/26/04.

CLINICAL DATA: Pneumonia.
 PORTABLE [id number] VIEW:

[view not recorded]
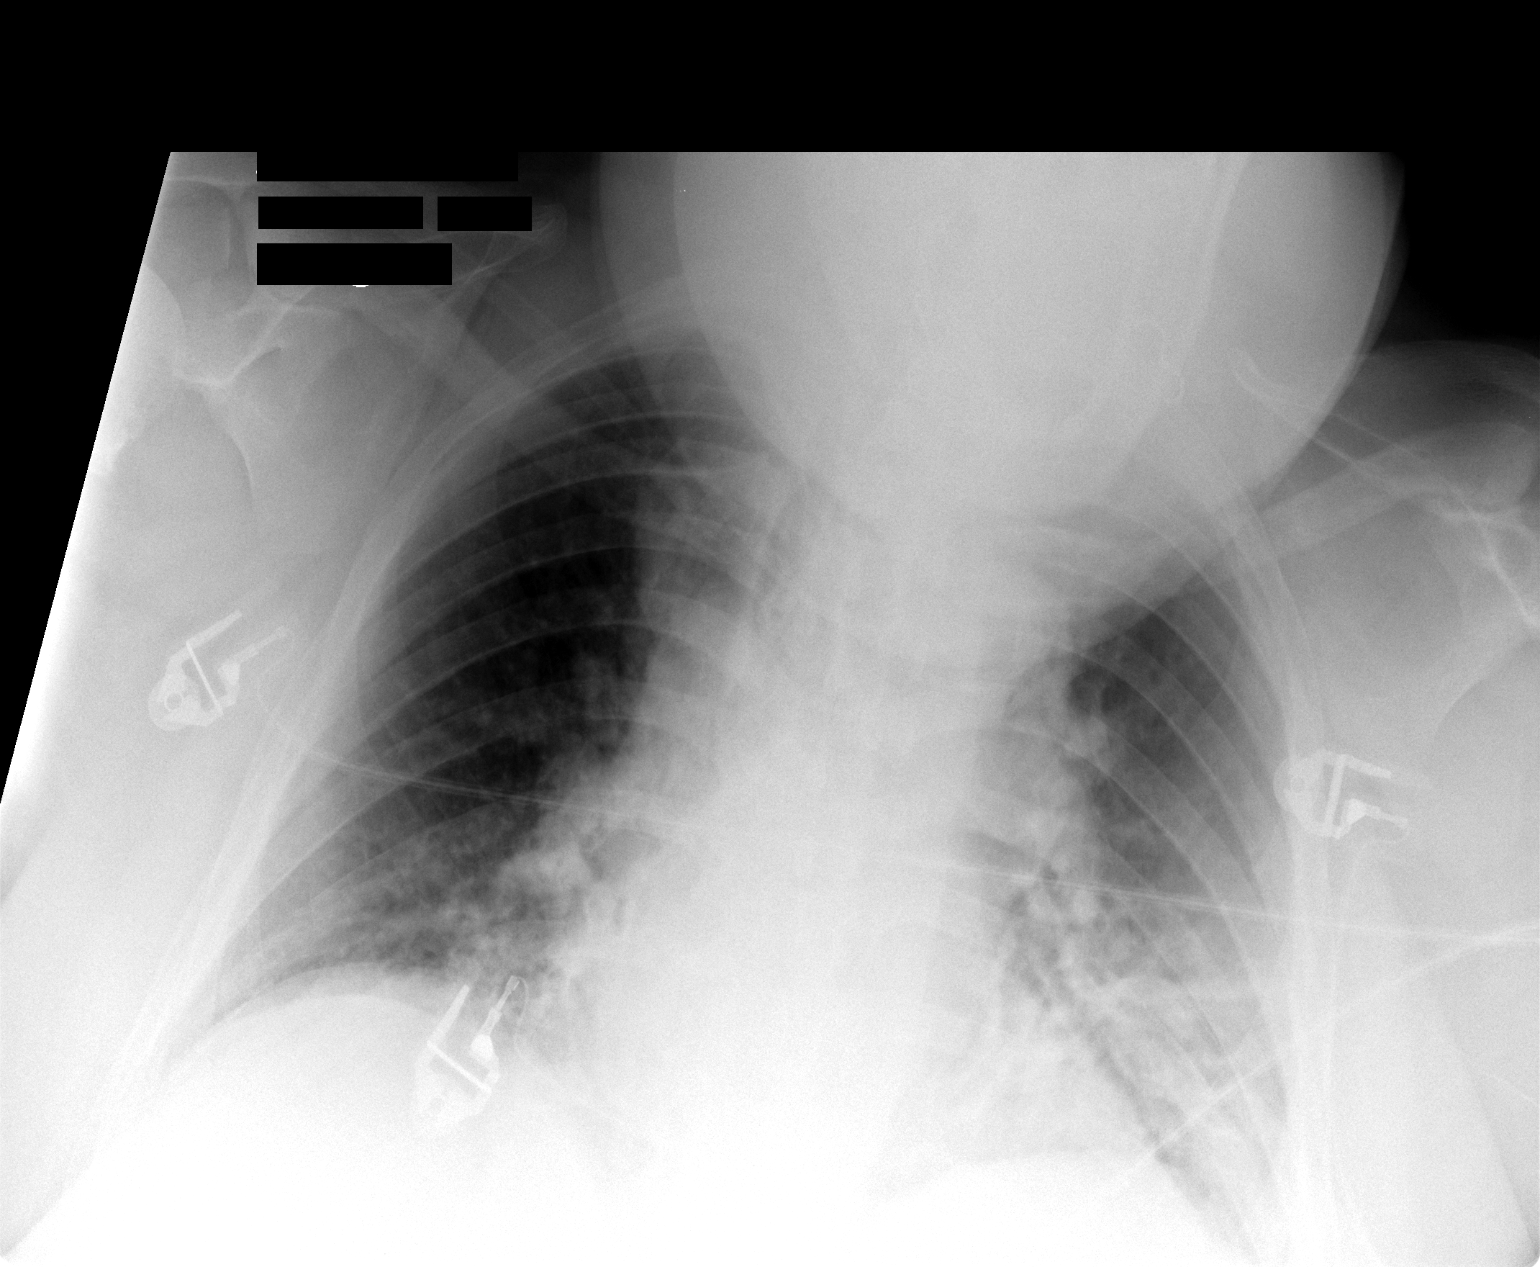

[1 of 1 positions shown; findings below may reference images not displayed]

FINDINGS: Decreased lung volumes with increasing left basilar atelectasis.  Cardiomegaly and pulmonary vascular congestion again noted.  Interstitial opacities are stable.
IMPRESSION: Slight increasing basilar atelectasis, otherwise stable chest.

## 2006-05-07 IMAGING — CT CT ABDOMEN W/O CM
2 of 4 series · 16 of 46 positions shown, 18 images · IV contrast (agent unspecified)
Comparison: 09/26/04

CLINICAL DATA: Left lower quadrant pain.  Pneumonia.
 ABDOMEN CT WITHOUT CONTRAST:
TECHNIQUE: Multidetector CT imaging of the abdomen was performed following the standard protocol without IV contrast.
TECHNIQUE: Multidetector CT imaging of the pelvis was performed following the standard protocol without IV contrast.

[Series 2: stone_wo 5.0 b40f st · axial · 0.81mm/px · z∈[-298,+130]mm · 13 of 117 slices shown, 15 images]
[im 5/117  soft-tissue]
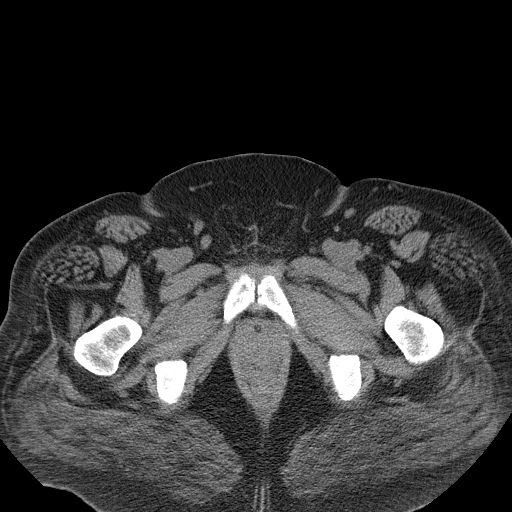
[im 5/117  bone]
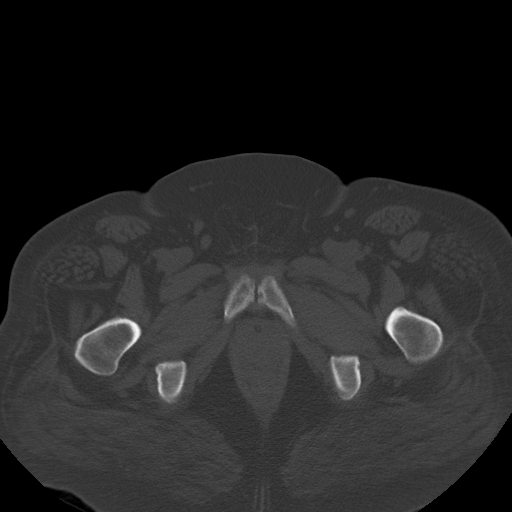
[im 14/117  soft-tissue]
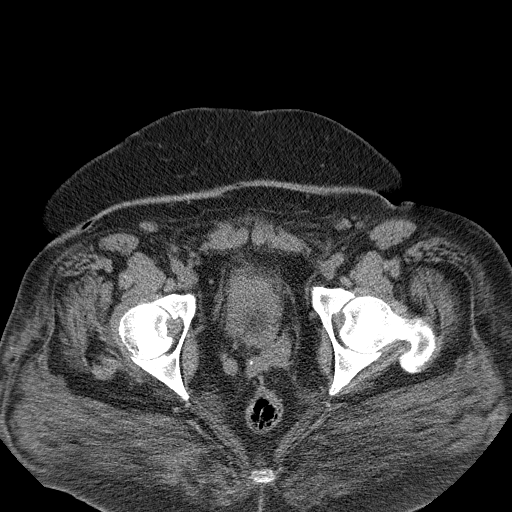
[im 24/117  soft-tissue]
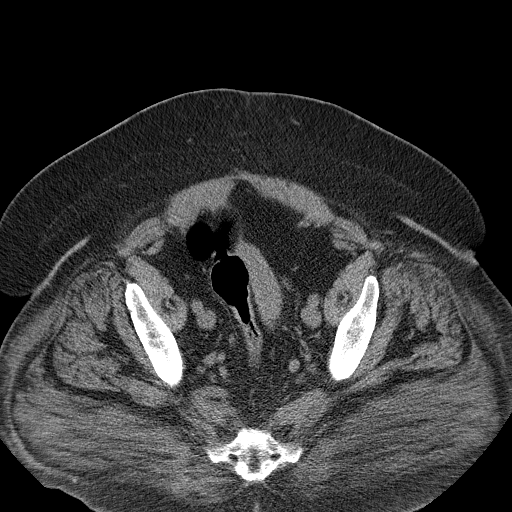
[im 33/117  soft-tissue]
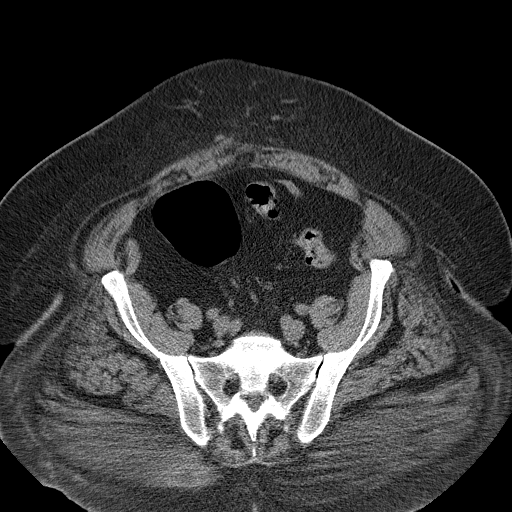
[im 42/117  soft-tissue]
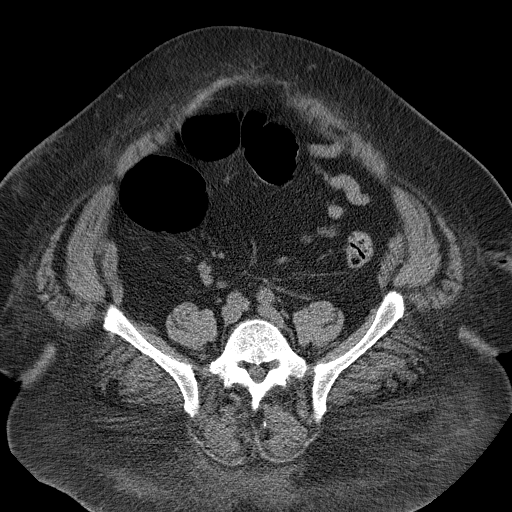
[im 52/117  soft-tissue]
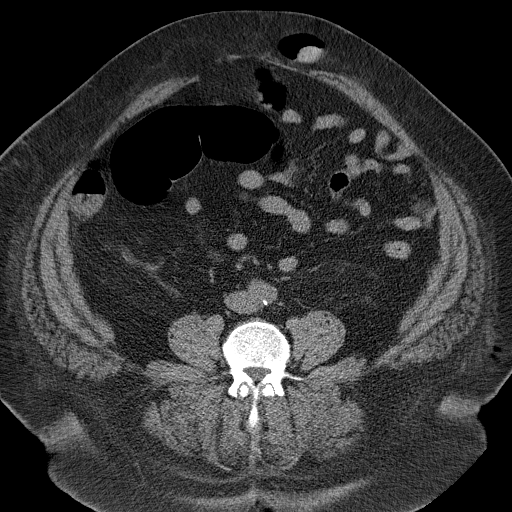
[im 61/117  soft-tissue]
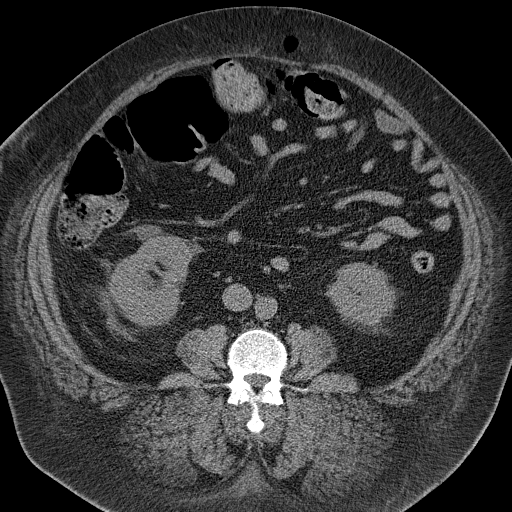
[im 65/117  soft-tissue]
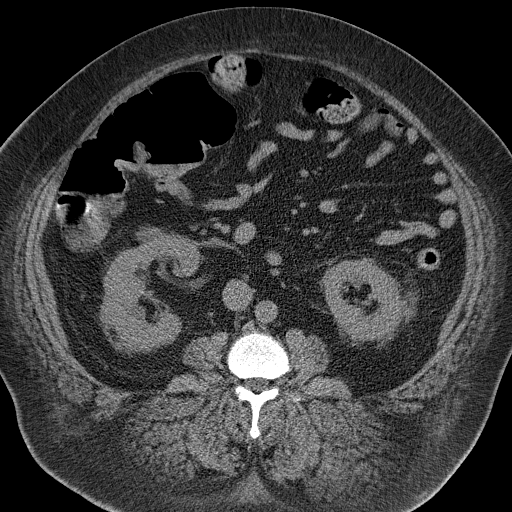
[im 75/117  soft-tissue]
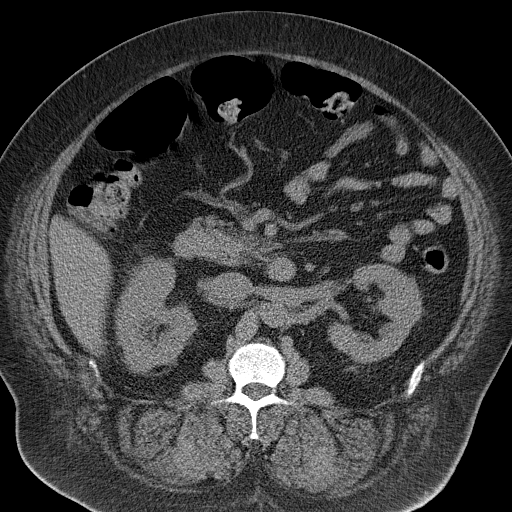
[im 75/117  bone]
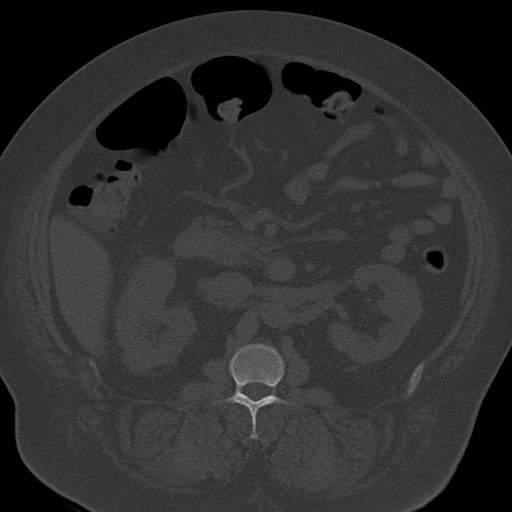
[im 84/117  soft-tissue]
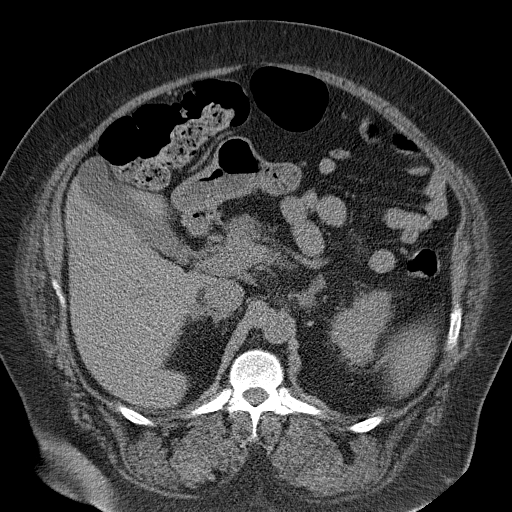
[im 93/117  soft-tissue]
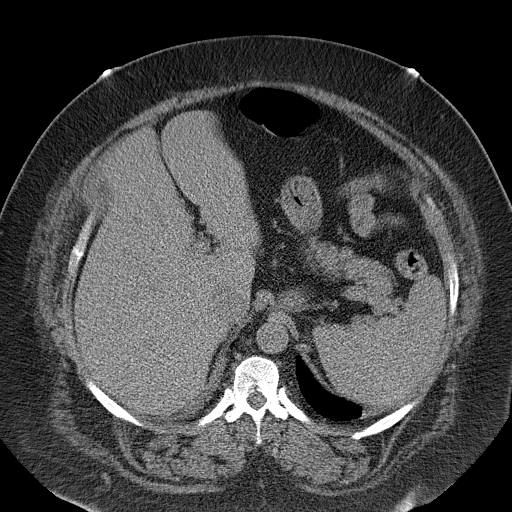
[im 103/117  soft-tissue]
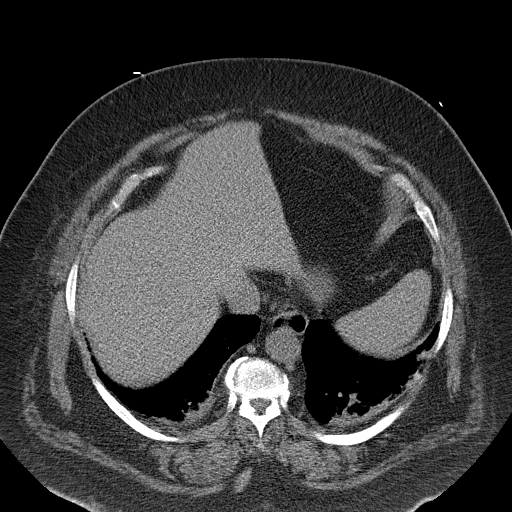
[im 112/117  soft-tissue]
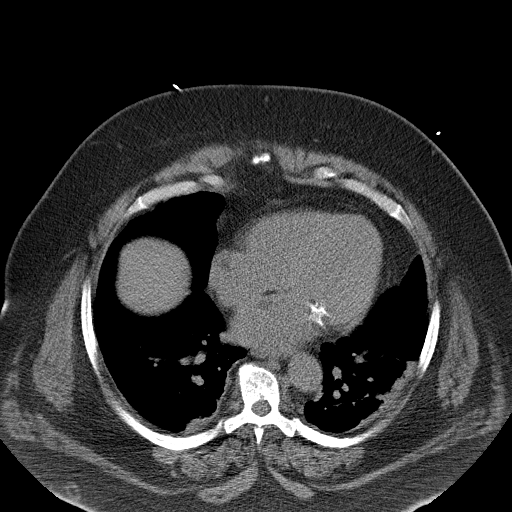

[Series 602: coronal · coronal · 0.95mm/px · 3 of 68 slices shown]
[im 23/68  soft-tissue]
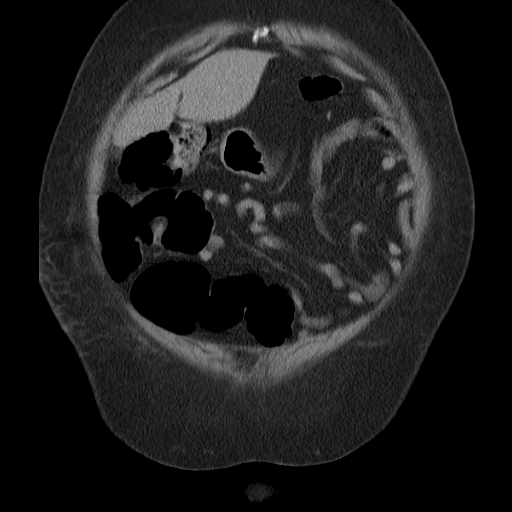
[im 30/68  soft-tissue]
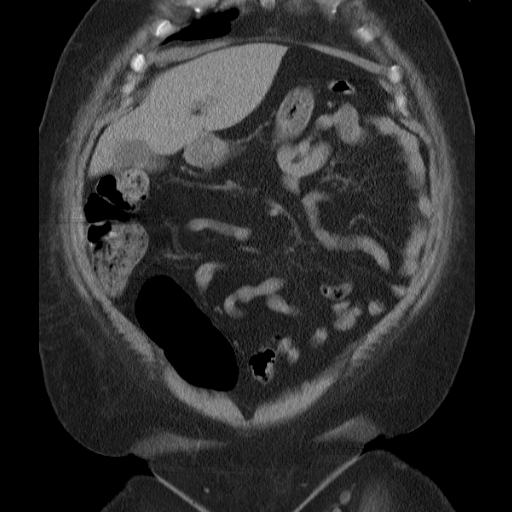
[im 38/68  soft-tissue]
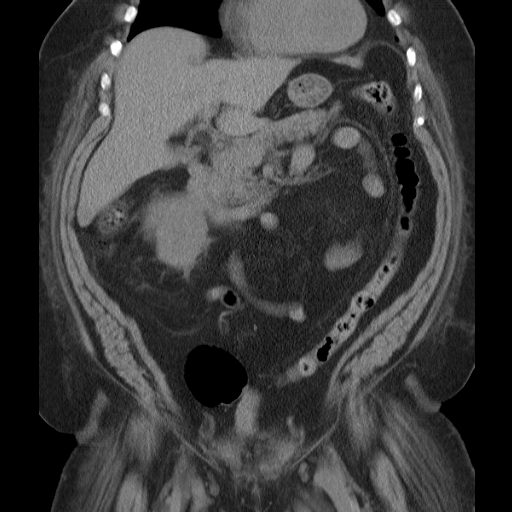

[16 of 46 positions shown; findings below may reference images not displayed]

FINDINGS: Minimal airspace disease in the right lung base likely related to atelectasis.  Similar dependent airspace disease in the left lung base also likely atelectatic.  Heart mildly enlarged.  No pleural effusion.  Uninfused appearance of the liver, spleen normal.  Small hiatal hernia.  Stomach, pancreas, gallbladder, right adrenal gland normal.  There is likely a lateral limb left adrenal gland subcentimeter nodule.  This is similar to on the exam of 7441 and likely therefore related to an adenoma.  Mild atherosclerotic disease in the abdominal aorta.  No retroperitoneal or retrocrural adenopathy.  There is a ventral hernia with a loop of nonobstructive transverse colon within.  This is new since the prior exam of 7441.  A second right-sided ventral wall defect has only omental fat within.  Small bowel is of normal caliber and wall thickness.  Appendix is not well visualized.  
 There is a right lower pole renal lesion measuring approximately 1.7cm.  This measures 22 Hounsfield units.  Perirenal edema is symmetric and likely related to renal insufficiency.  There is no ureteric calculi.
IMPRESSION: 1.  Ventral hernia containing nonobstructive transverse colon is new since the prior exam. A second area of ventral herniation contains only omental fat.  
 2.  No other explanation for patient?s symptoms. 
 3.  Probable left adrenal adenoma.
 4.  Right renal lesion may represent a minimally complex cyst.  This could be evaluated with ultrasound if further evaluation is desired.
 5. Small hiatal hernia.
 PELVIS CT WITHOUT CONTRAST:
FINDINGS: Pelvic large and small bowel loops are normal.  No ascites or adenopathy.  Urinary bladder is contracted.  There is pericystic edema.  Hysterectomy.  No significant ascites.
 Bone windows demonstrate no focal osseous lesion.
IMPRESSION: 1.  Pericystic edema could be related to cystitis.  
 2.  Hysterectomy.

## 2006-05-09 IMAGING — CT CT ANGIO CHEST
1 of 2 series · 19 of 33 positions shown · IV contrast (omnipaque)
Comparison: None.

CLINICAL DATA: 59-year-old, shortness of breath, chest pain, pneumonia.  
 CT ANGIOGRAPHY OF CHEST:
TECHNIQUE: Multidetector CT imaging of the chest was performed during bolus injection of intravenous contrast.  Multiplanar CT angiographic image reconstructions were generated to evaluate the vascular anatomy.
 Contrast:  150 cc Omnipaque 300.

[Series 4: chest/pe 1.0 b10f · axial · 0.82mm/px · z∈[-258,-51]mm · 19 of 455 slices shown]
[im 20/455  lung]
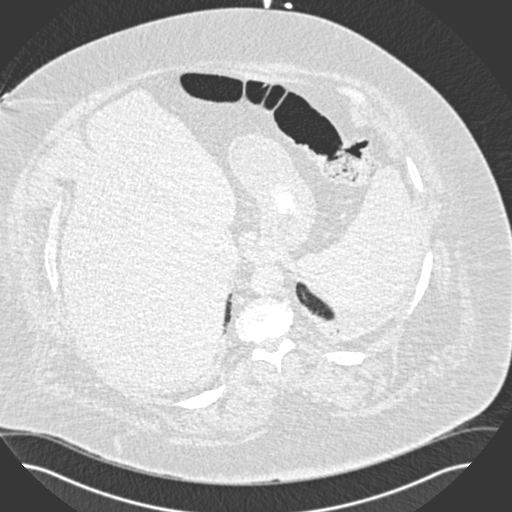
[im 60/455  mediastinal]
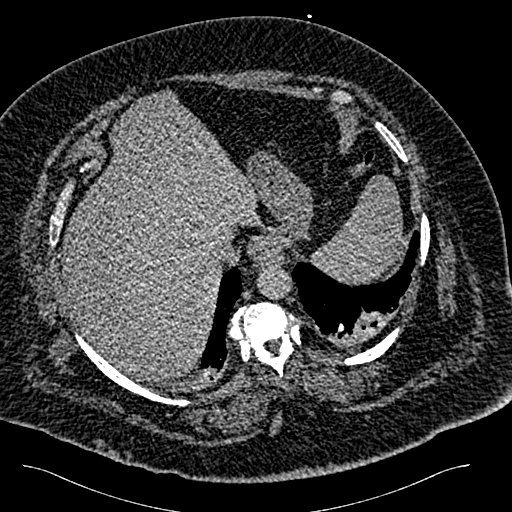
[im 79/455  lung]
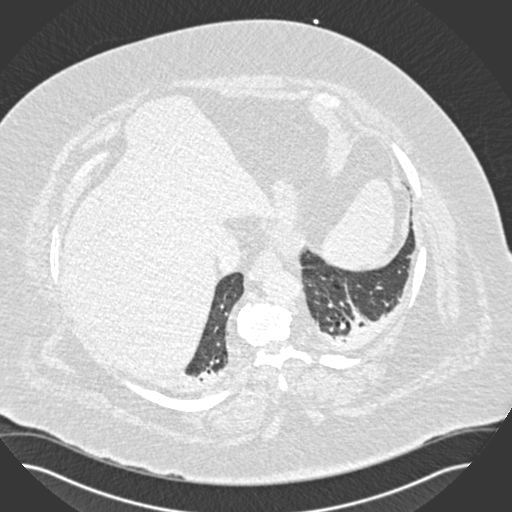
[im 99/455  mediastinal]
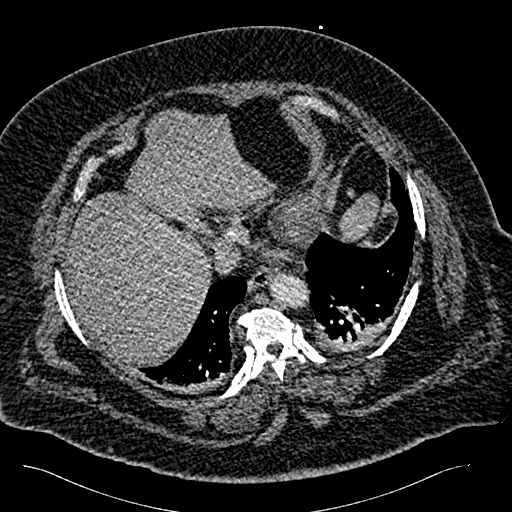
[im 119/455  lung]
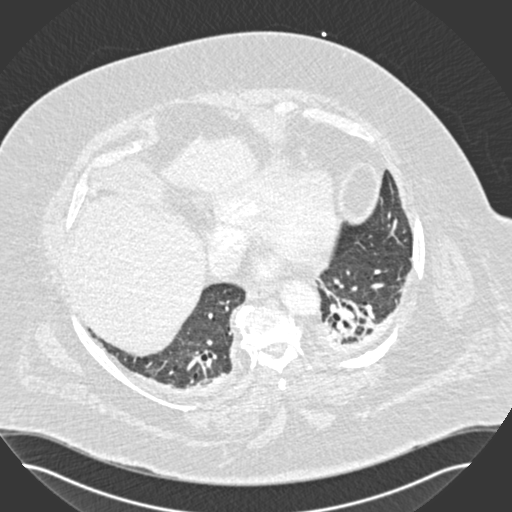
[im 139/455  mediastinal]
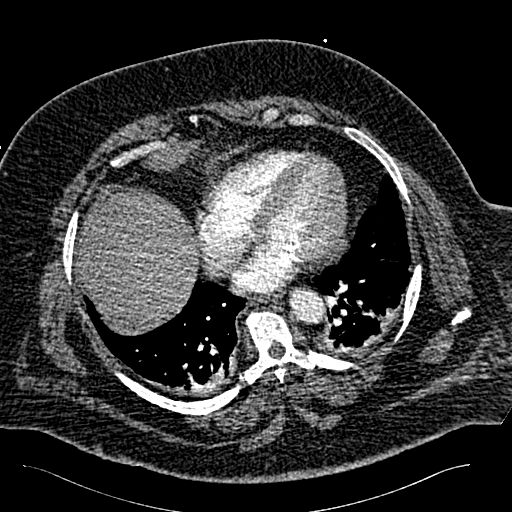
[im 158/455  lung]
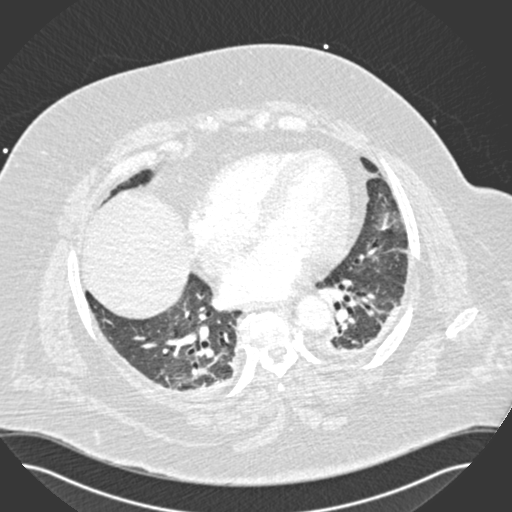
[im 198/455  mediastinal]
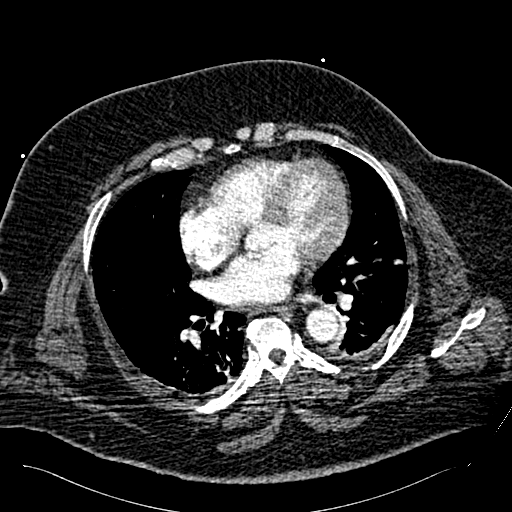
[im 208/455  lung]
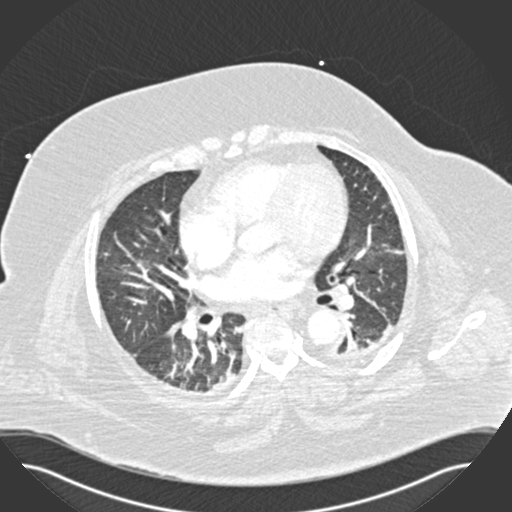
[im 228/455  mediastinal]
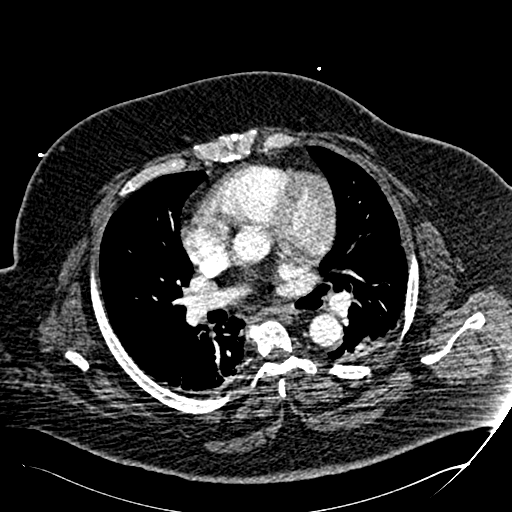
[im 237/455  lung]
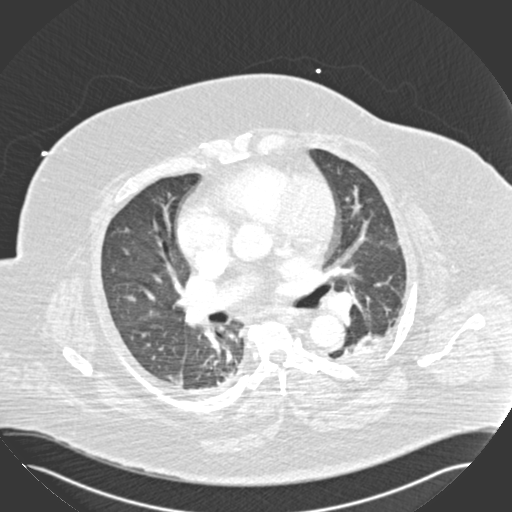
[im 257/455  mediastinal]
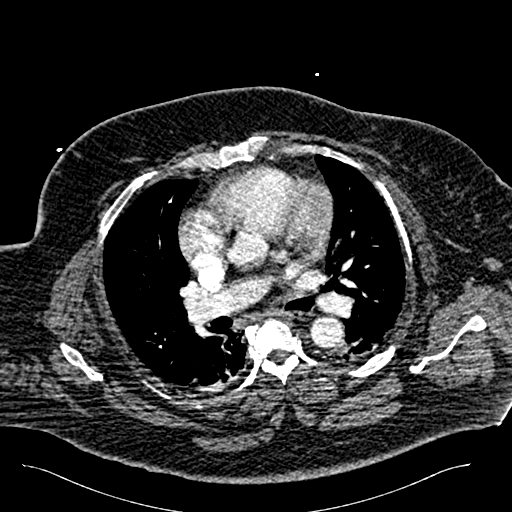
[im 297/455  lung]
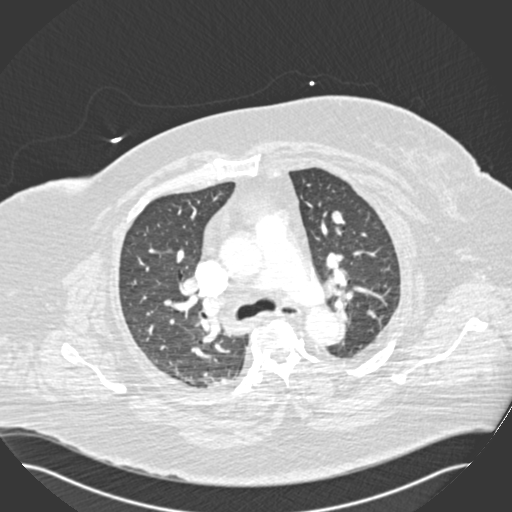
[im 316/455  mediastinal]
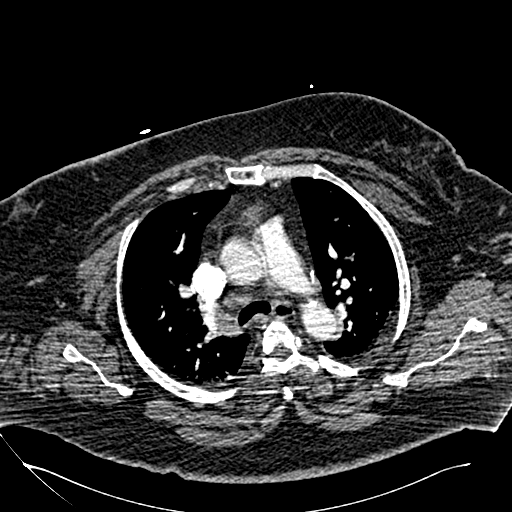
[im 336/455  lung]
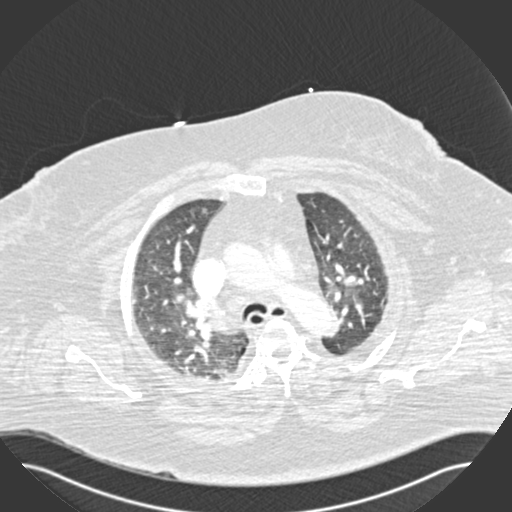
[im 356/455  mediastinal]
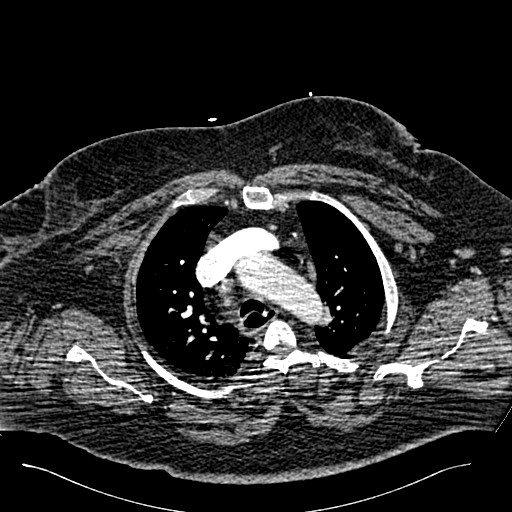
[im 376/455  lung]
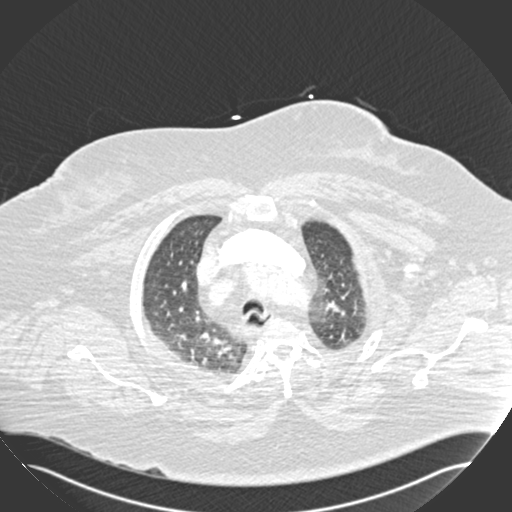
[im 395/455  mediastinal]
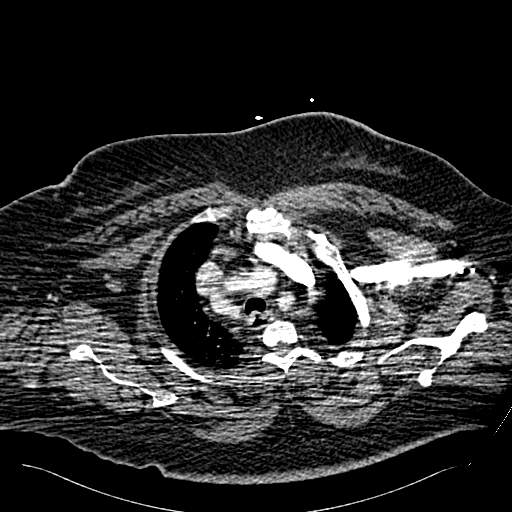
[im 435/455  lung]
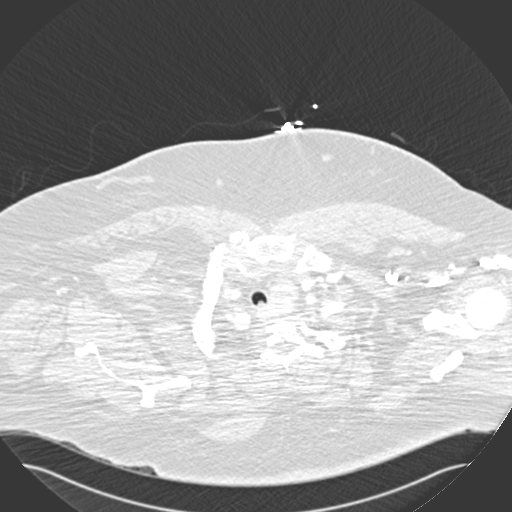

[19 of 33 positions shown; findings below may reference images not displayed]

FINDINGS: The chest wall, soft tissues and bony structures are grossly normal.  There are degenerative changes throughout the thoracic spine but no destructive bony lesions are seen.  The patient does have an enlarged left lobe of the thyroid gland consistent with a thyroid goiter.  The aorta is normal in caliber and no evidence for dissection.  Heart size is borderline in size.  No pericardial effusion. There are some scattered mediastinal and hilar lymph nodes, the largest being a pretracheal node measuring 2.2 cm.  The pulmonary arterial tree is fairly well opacified.  No filling defects to suggest pulmonary emboli.  Esophagus is grossly normal. The patient does have a small hiatal hernia.  
 Examination of the lung parenchyma demonstrates mild edema-like changes.  There is dependent atelectasis and small bilateral pleural effusions.  No worrisome pulmonary masses.  
 The upper abdomen demonstrates no significant findings.
IMPRESSION: 1.  No CT evidence for pulmonary emboli.  
 2.  Mild edema and atelectasis and tiny bilateral effusions. 
 3.  Scattered mediastinal lymph nodes. 
 4.  Small hiatal hernia.  
 5.   Left thyroid goiter.

## 2006-06-08 ENCOUNTER — Encounter: Admission: RE | Admit: 2006-06-08 | Discharge: 2006-06-08 | Payer: Self-pay | Admitting: Internal Medicine

## 2006-06-19 ENCOUNTER — Inpatient Hospital Stay (HOSPITAL_COMMUNITY): Admission: RE | Admit: 2006-06-19 | Discharge: 2006-06-30 | Payer: Self-pay | Admitting: General Surgery

## 2006-06-19 ENCOUNTER — Ambulatory Visit: Payer: Self-pay | Admitting: Pulmonary Disease

## 2006-06-19 ENCOUNTER — Encounter (HOSPITAL_BASED_OUTPATIENT_CLINIC_OR_DEPARTMENT_OTHER): Payer: Self-pay | Admitting: General Surgery

## 2006-06-23 ENCOUNTER — Ambulatory Visit: Payer: Self-pay | Admitting: Vascular Surgery

## 2006-06-23 ENCOUNTER — Encounter (INDEPENDENT_AMBULATORY_CARE_PROVIDER_SITE_OTHER): Payer: Self-pay | Admitting: Pulmonary Disease

## 2006-07-08 ENCOUNTER — Encounter (HOSPITAL_COMMUNITY): Admission: RE | Admit: 2006-07-08 | Discharge: 2006-09-22 | Payer: Self-pay | Admitting: Nephrology

## 2006-11-25 ENCOUNTER — Encounter (HOSPITAL_COMMUNITY): Admission: RE | Admit: 2006-11-25 | Discharge: 2007-01-28 | Payer: Self-pay | Admitting: Nephrology

## 2006-12-05 IMAGING — CR DG CHEST 2V
2 series · 2 of 2 positions shown · non-contrast
Comparison: 07/09/05.

CLINICAL DATA: Shortness of breath.  
 CHEST ? 2 VIEW:

[view not recorded (1 of 2)]
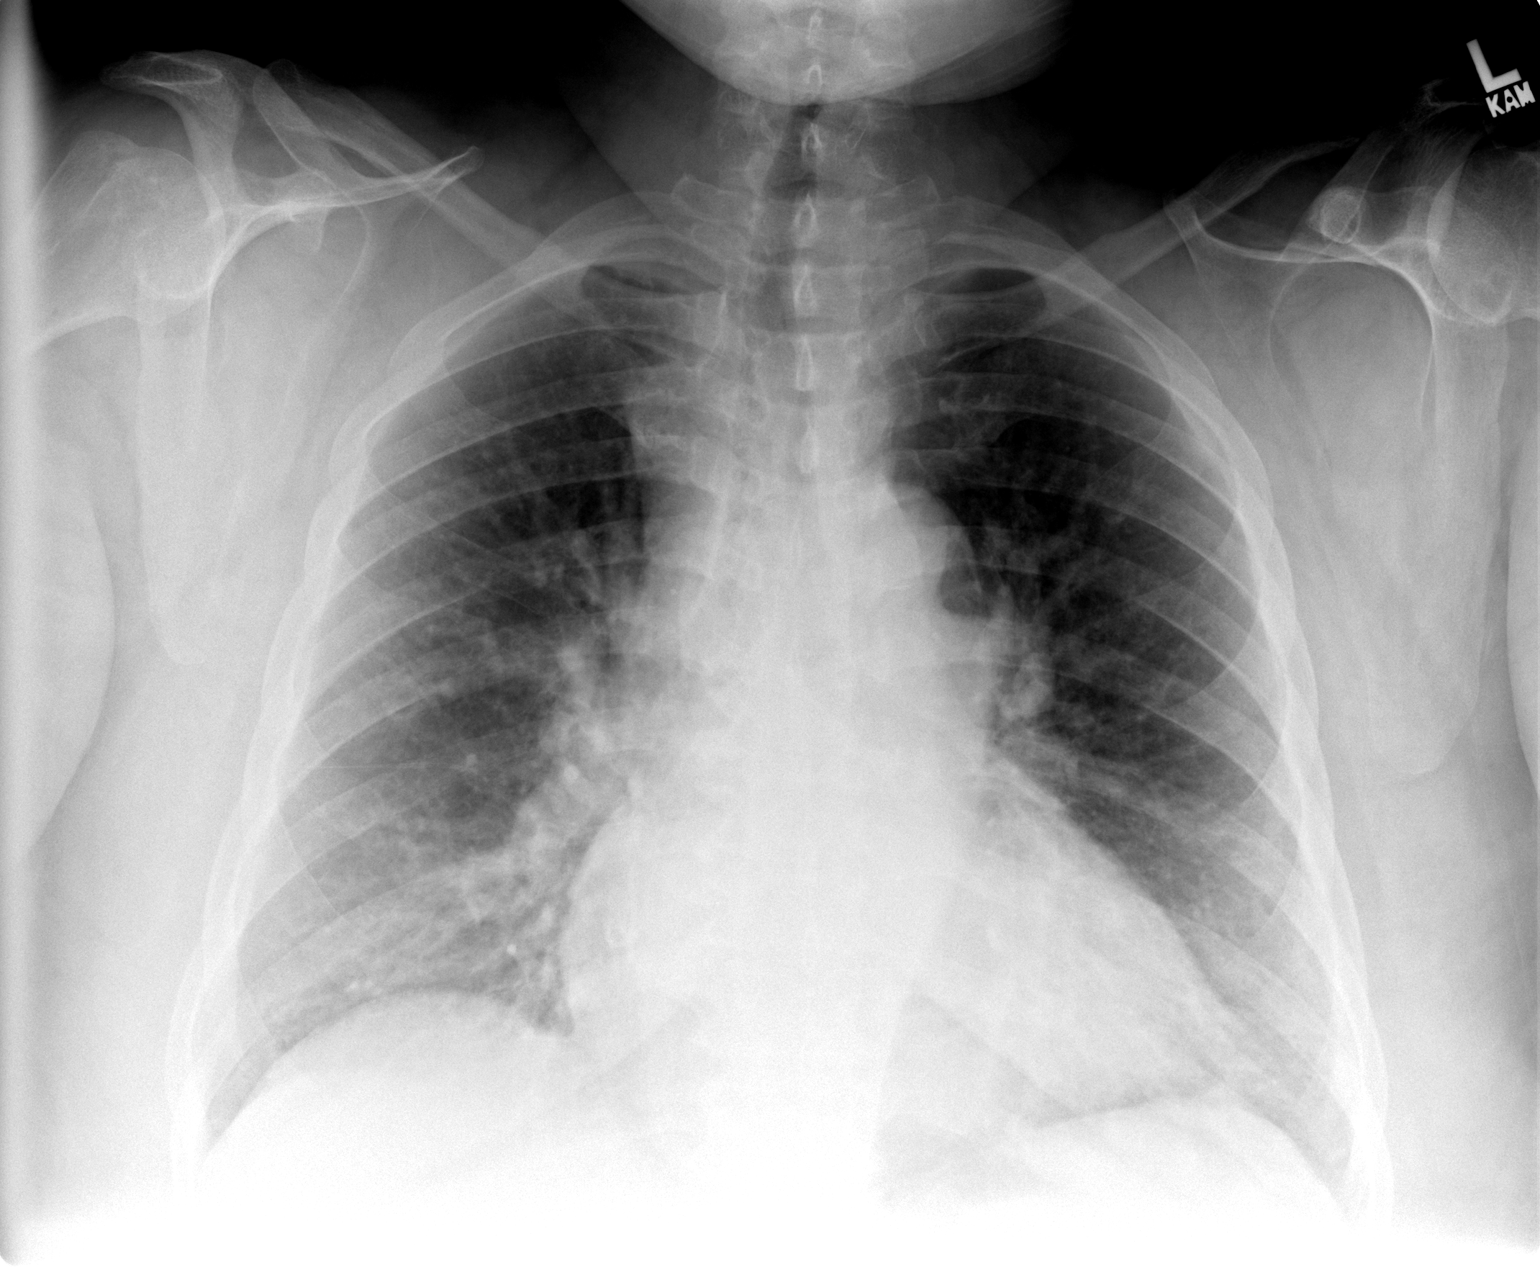

[view not recorded (2 of 2)]
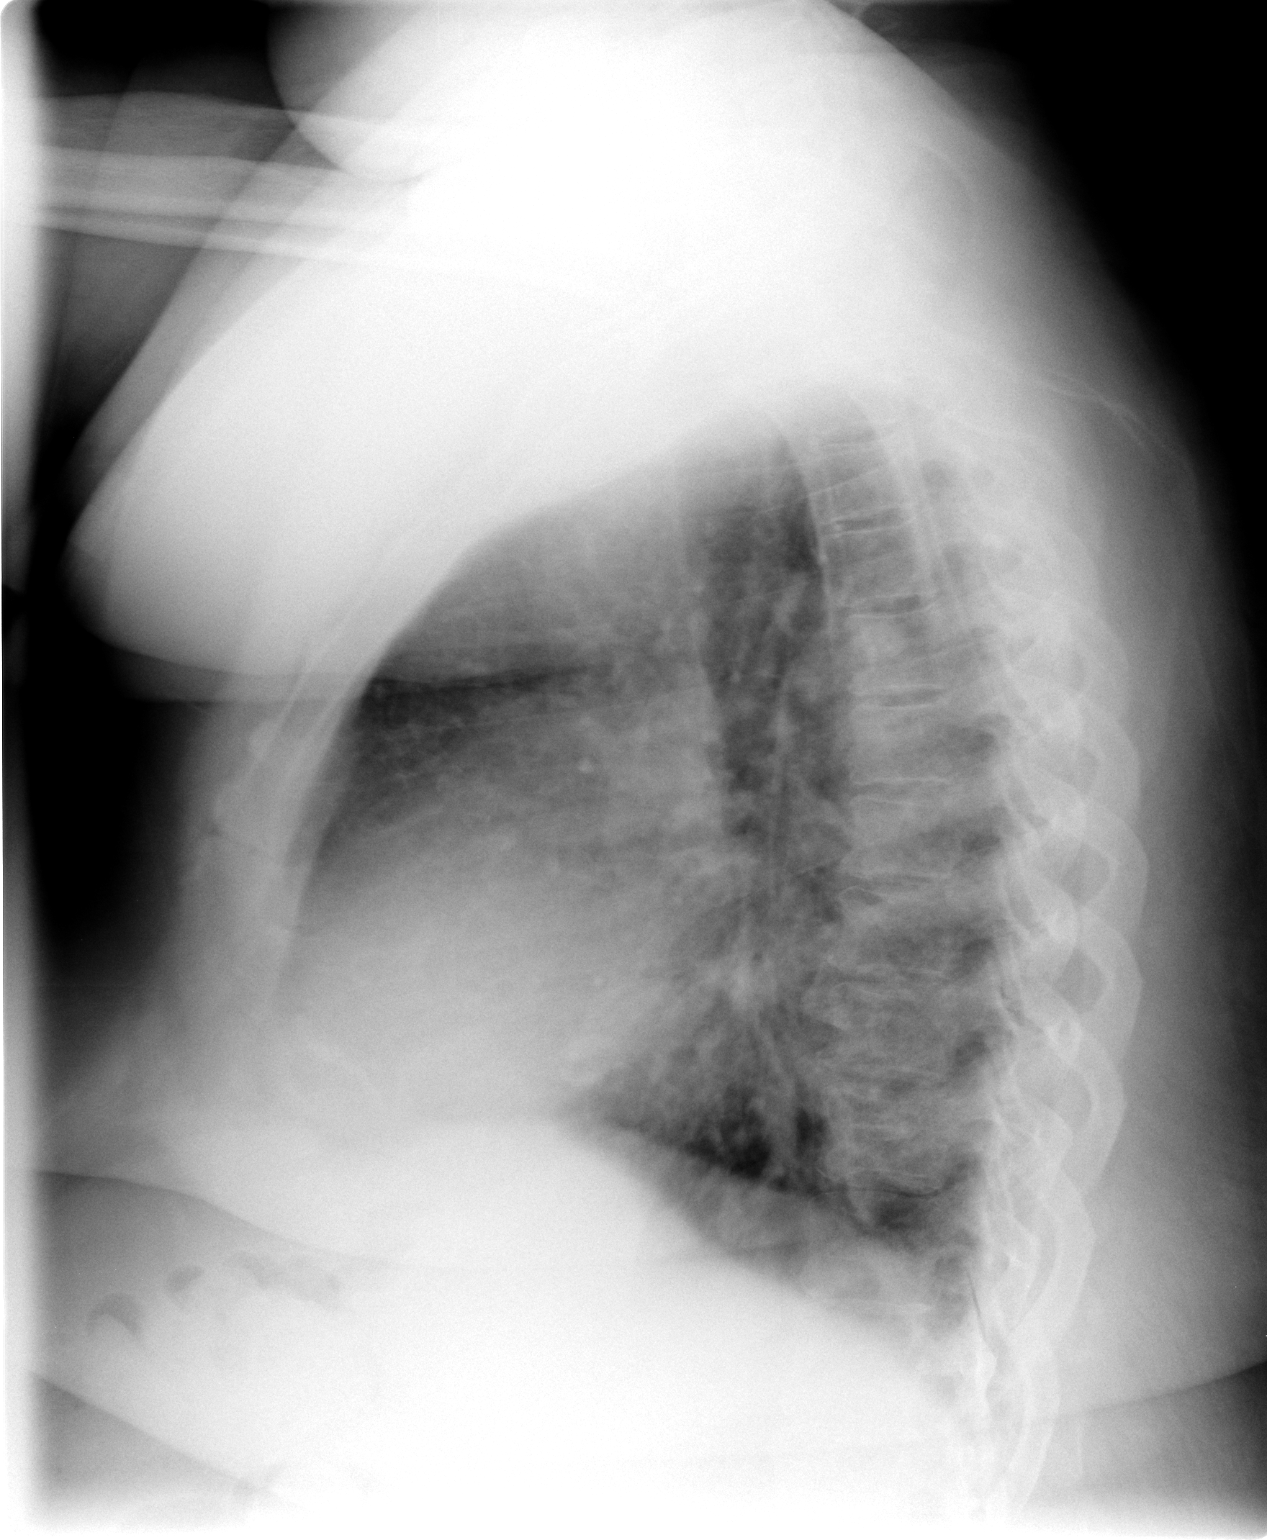

[2 of 2 positions shown; findings below may reference images not displayed]

FINDINGS: Again seen is cardiomegaly with pulmonary vascular congestion and mild interstitial edema.  Appearance of the chest is not markedly changed.  There is no pleural effusion or focal airspace disease.  No focal bony abnormality.
IMPRESSION: Cardiomegaly with mild interstitial edema.  No marked change since 07/09/05.

## 2006-12-16 IMAGING — CR DG CHEST 1V PORT
1 series · 1 of 1 positions shown · non-contrast
Comparison: Examination 07/28/05

CLINICAL DATA: Shortness of breath, fever, cough.
PORTABLE SEMI-ERECT CHEST ? 1 VIEW:

[view not recorded]
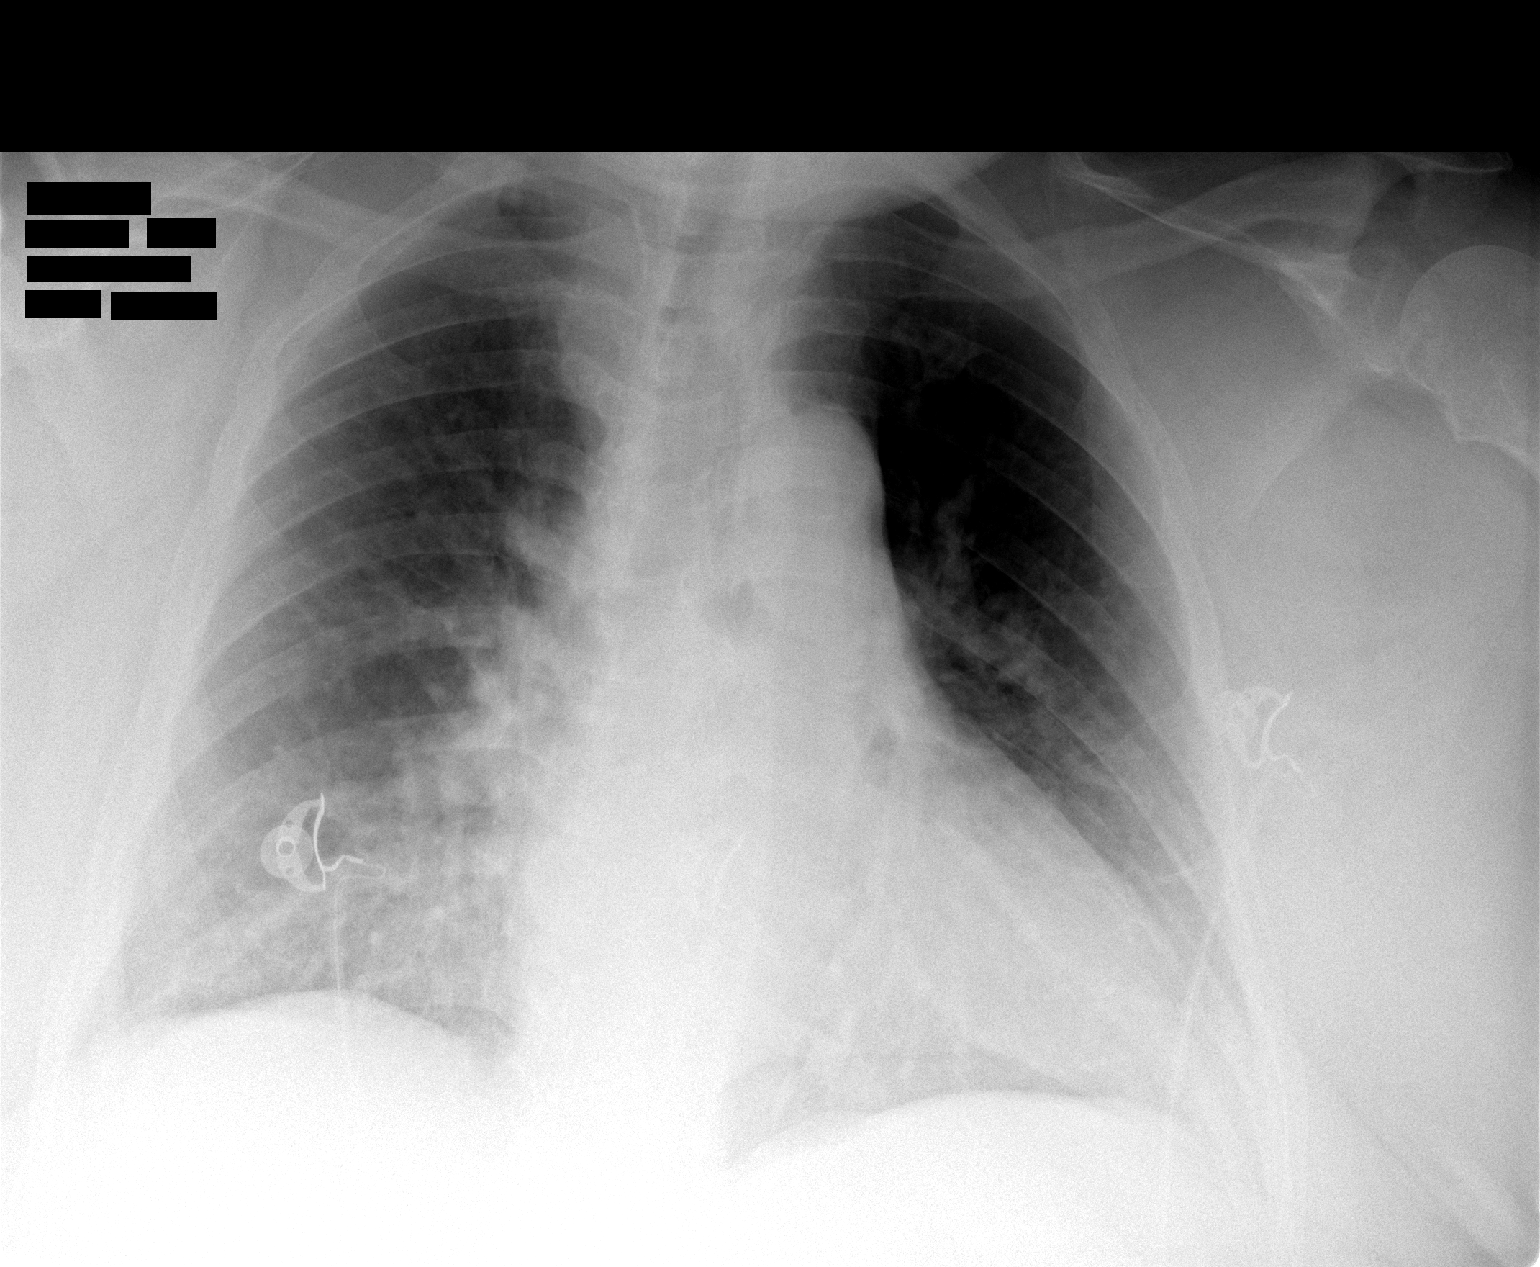

[1 of 1 positions shown; findings below may reference images not displayed]

FINDINGS: Cardiomegaly and pulmonary vascular congestion.  No segmental infiltrate or pneumothorax.  Slightly tortuous aorta.
IMPRESSION: 1.    
2.  Impression upon the left aspect of the trachea may represent extension of goiter.  CT can be obtained for further delineation.

## 2007-01-12 ENCOUNTER — Ambulatory Visit: Payer: Self-pay | Admitting: Oncology

## 2007-01-20 ENCOUNTER — Encounter (HOSPITAL_COMMUNITY): Admission: RE | Admit: 2007-01-20 | Discharge: 2007-01-20 | Payer: Self-pay | Admitting: Nephrology

## 2007-03-24 IMAGING — CR DG CHEST 2V
2 series · 2 of 2 positions shown · non-contrast
Comparison: 08/08/05

CLINICAL DATA: Shortness of breath.
 CHEST - 2 VIEW:

[view not recorded (1 of 2)]
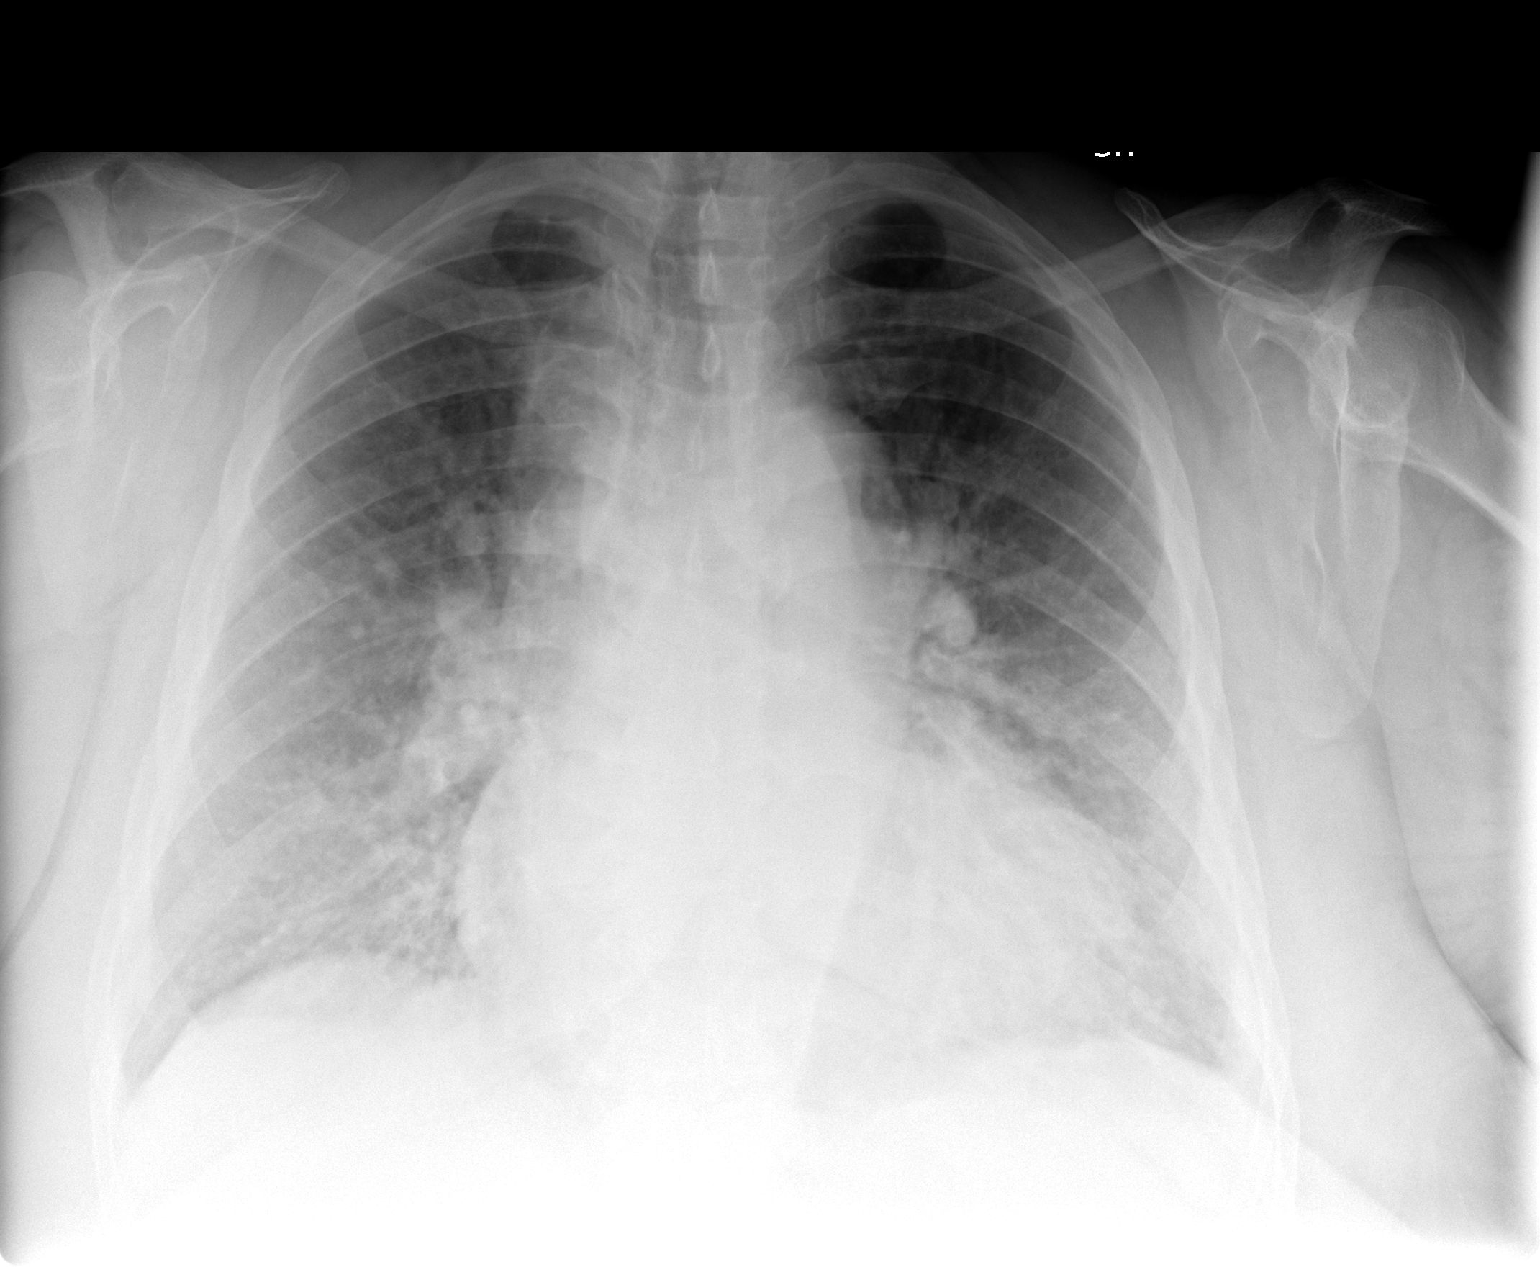

[view not recorded (2 of 2)]
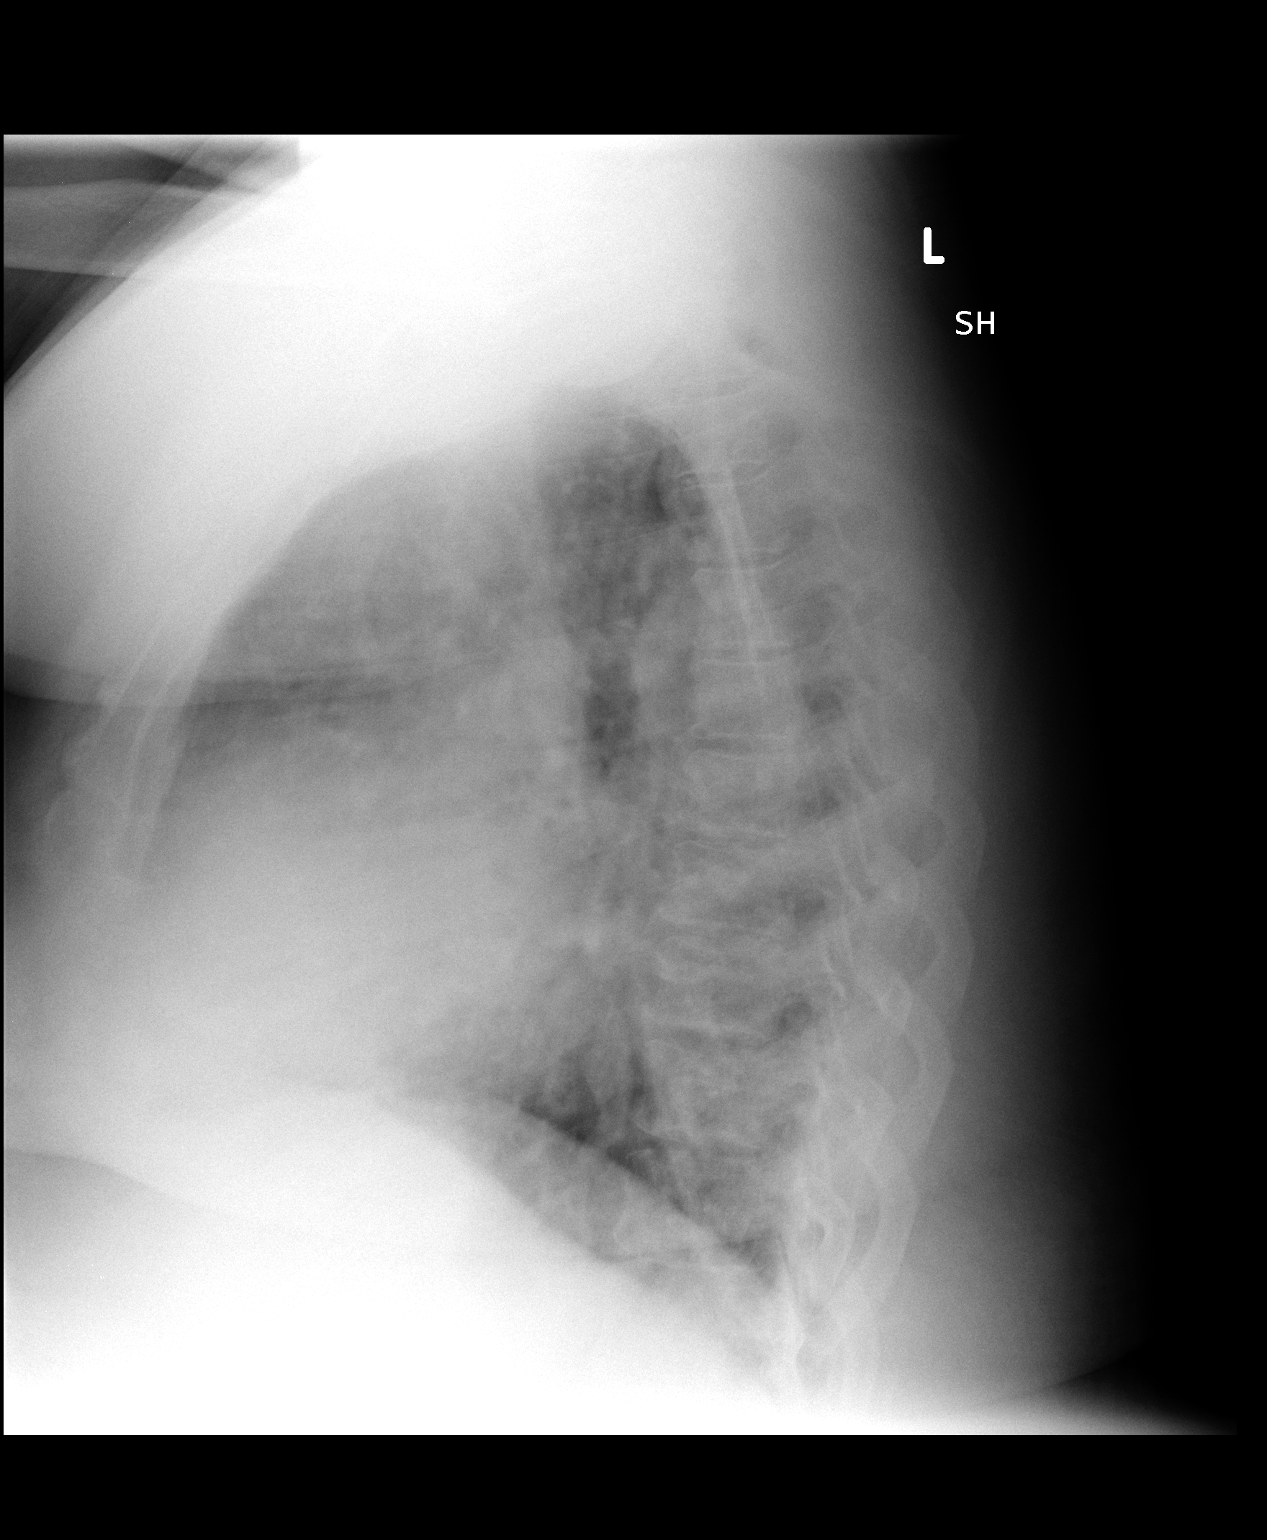

[2 of 2 positions shown; findings below may reference images not displayed]

FINDINGS: Cardiomegaly is unchanged.  Pulmonary vascular cephalization and interstitial edema have worsened slightly since the prior study.  No gross pleural effusions are identified.
IMPRESSION: Slight interval worsening in interstitial pulmonary edema since the 08/08/05 film.

## 2007-04-17 IMAGING — CT CT ABDOMEN W/O CM
2 of 4 series · 16 of 46 positions shown, 18 images · IV contrast (agent unspecified)
Comparison: 12/28/04.

CLINICAL DATA: Abdominal and pelvic pain.  History of hysterectomy and hiatal hernia repair.  
ABDOMEN CT WITHOUT CONTRAST:
TECHNIQUE: Multidetector CT imaging of the abdomen was performed following the standard protocol with oral contrast and without IV contrast.  Intravenous contrast was not administered secondary to patient?s elevated creatinine.
TECHNIQUE: Multidetector CT imaging of the pelvis was performed following the standard protocol without IV contrast.

[Series 2: abd_pel_xxl 5.0 b10f st · axial · 0.98mm/px · z∈[-462,-42]mm · 13 of 93 slices shown, 15 images]
[im 5/93  soft-tissue]
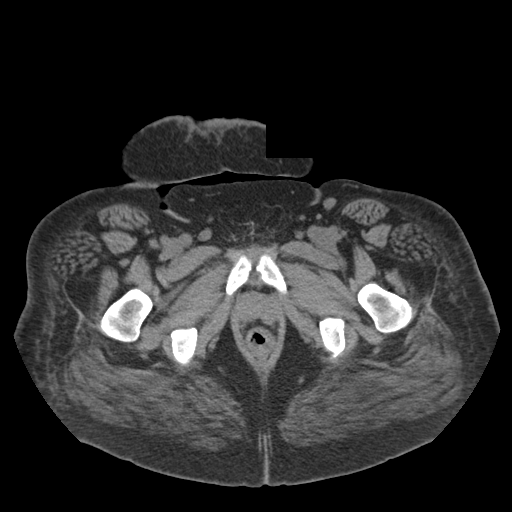
[im 5/93  bone]
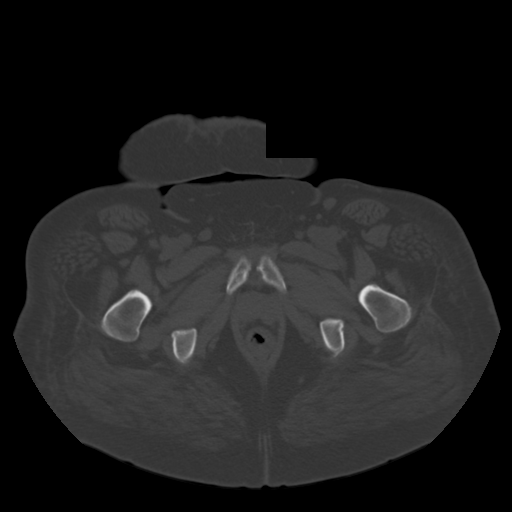
[im 13/93  soft-tissue]
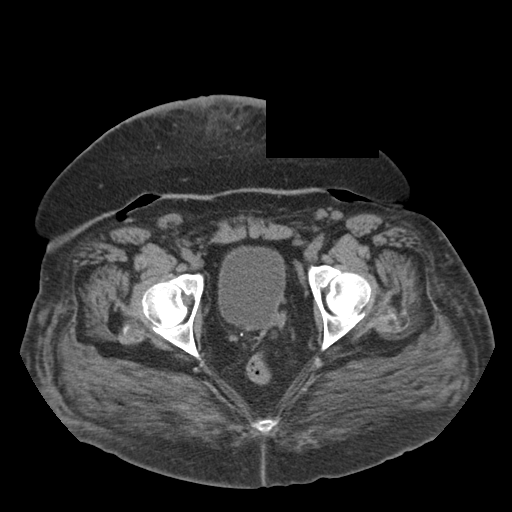
[im 21/93  soft-tissue]
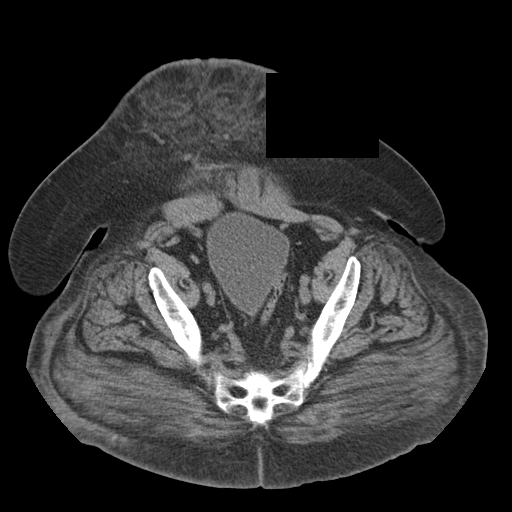
[im 25/93  soft-tissue]
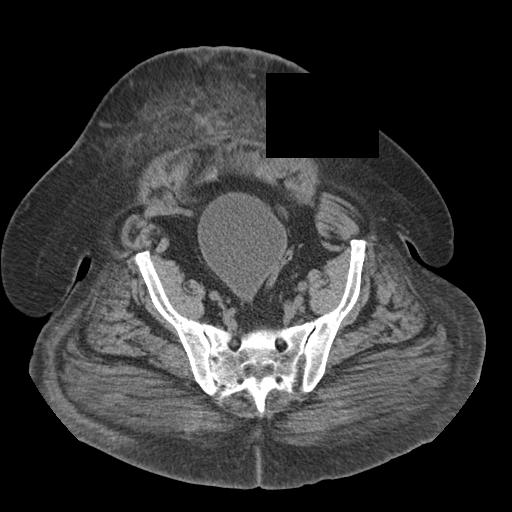
[im 33/93  soft-tissue]
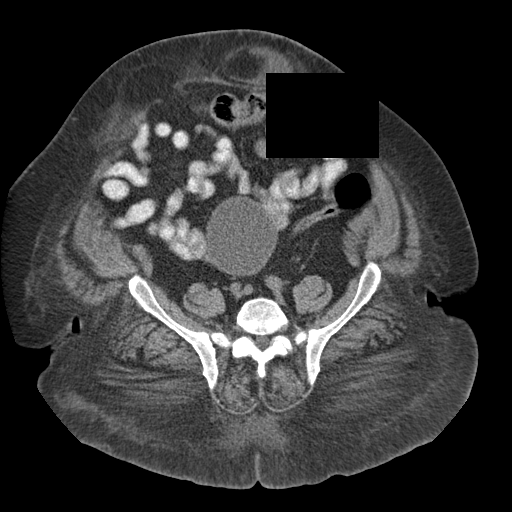
[im 41/93  soft-tissue]
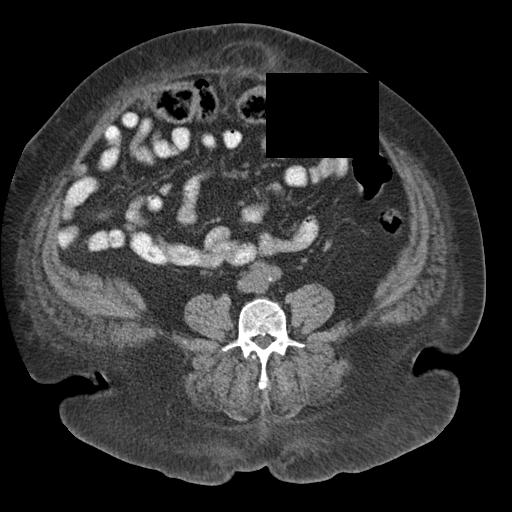
[im 49/93  soft-tissue]
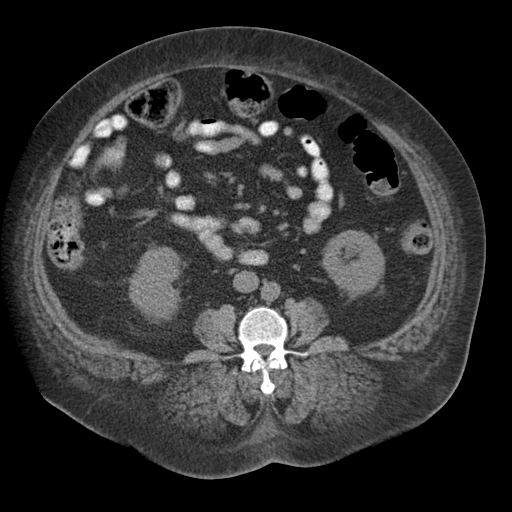
[im 53/93  soft-tissue]
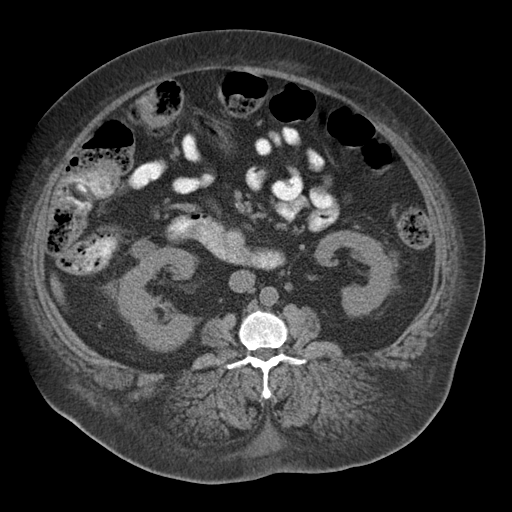
[im 61/93  soft-tissue]
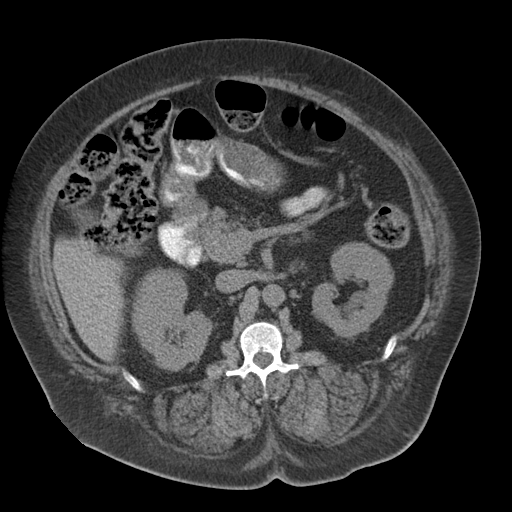
[im 61/93  bone]
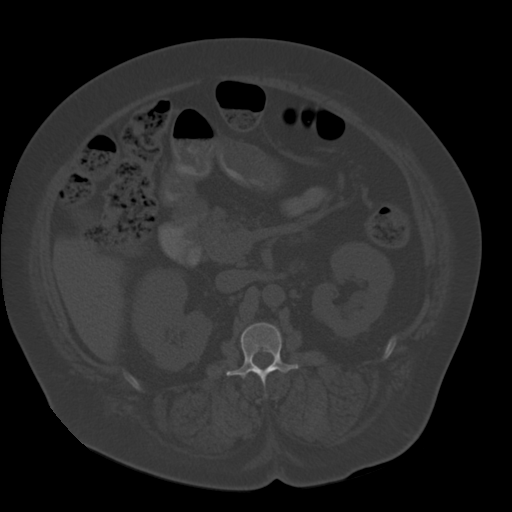
[im 69/93  soft-tissue]
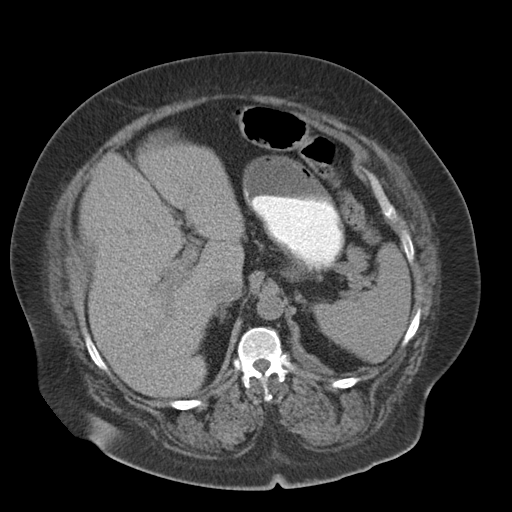
[im 73/93  soft-tissue]
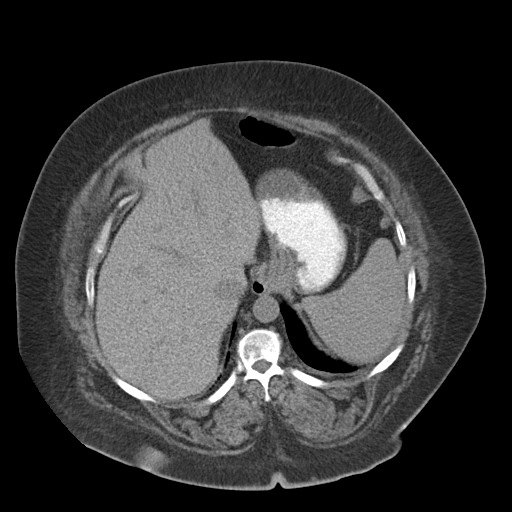
[im 81/93  soft-tissue]
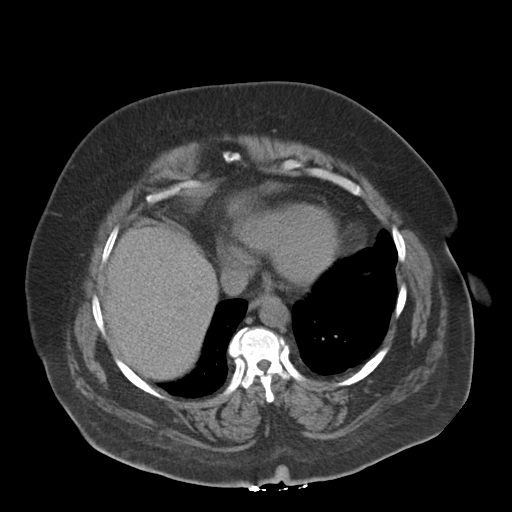
[im 89/93  soft-tissue]
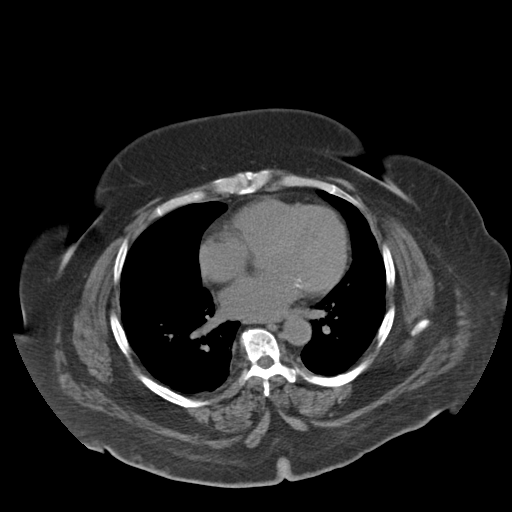

[Series 602: <mpr range> · coronal · 0.98mm/px · 3 of 39 slices shown]
[im 13/39  soft-tissue]
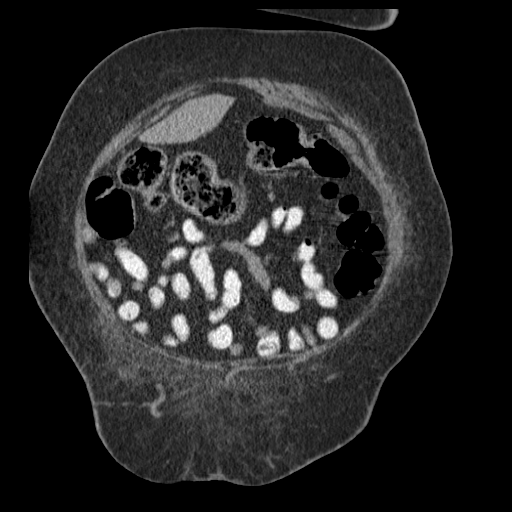
[im 17/39  soft-tissue]
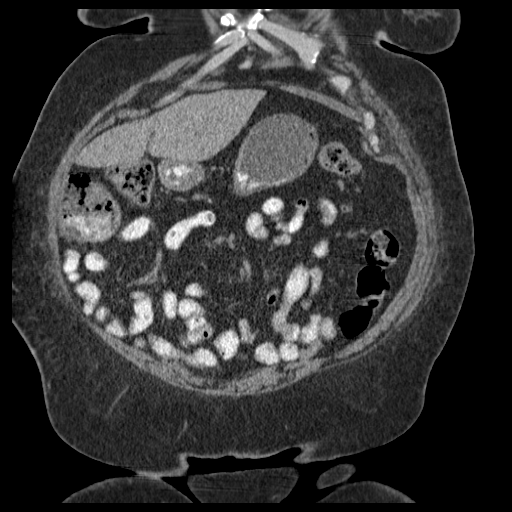
[im 22/39  soft-tissue]
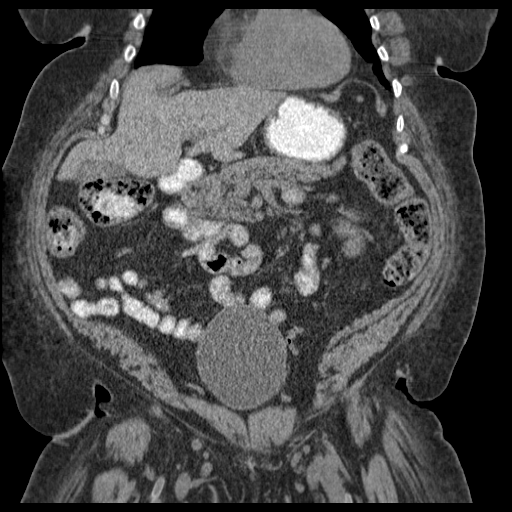

[16 of 46 positions shown; findings below may reference images not displayed]

FINDINGS: Mild cardiomegaly and bibasilar scarring are identified.  Liver is upper limits of normal density.  Remainder of the liver is unremarkable.  The spleen, pancreas, gallbladder, and right adrenal gland are unremarkable.  A stable 12 x 18 mm left adrenal lesion/adenoma is identified.  Perinephric stranding, mild renal atrophy, and a 1.7 x 2.1 cm right renal lesion are stable.  pancreas is within normal limits.  Please note that parenchymal abnormalities may be missed as intravenous contrast was not administered.  A small hiatal hernia is identified.  There is no evidence of free fluid, enlarged lymph node, abdominal aortic aneurysm, or biliary dilatation.  Visualized bowel is within normal limits.
IMPRESSION: 1.  No acute abnormalities. 
2.  Small hiatal hernia. 
3.  Stable 12 x 18 mm left adrenal lesion compatible with adenoma.  
PELVIS CT WITHOUT CONTRAST:
FINDINGS: Again noted are two moderate paramedian ventral hernias both containing fat.   There is now a small amount of fluid within the left paramedian ventral hernia and moderate inflammation within the anterior subcutaneous fat adjacent and inferior to both of these hernias.  There is no evidence of inguinal or femoral hernia.   No evidence of focal collection is identified.  There is no evidence of bowel into these hernias and no evidence of bowel obstruction.   The bladder and visualized bowel is unremarkable.  Patient is status-post hysterectomy.
IMPRESSION: Two moderate paramedian ventral hernias containing fat, the one on the left containing some fluid.  Moderate inflammation along the inferior aspect of both of these hernias.  No evidence of small bowel obstruction or bowel extending into these hernias.

## 2007-06-05 IMAGING — CR DG NECK SOFT TISSUE
2 series · 2 of 2 positions shown · non-contrast
Comparison: None.

NECK SOFT TISSUES - ONE VIEW:

CLINICAL DATA: Shortness of breath. Coughing up blood

[view not recorded (1 of 2)]
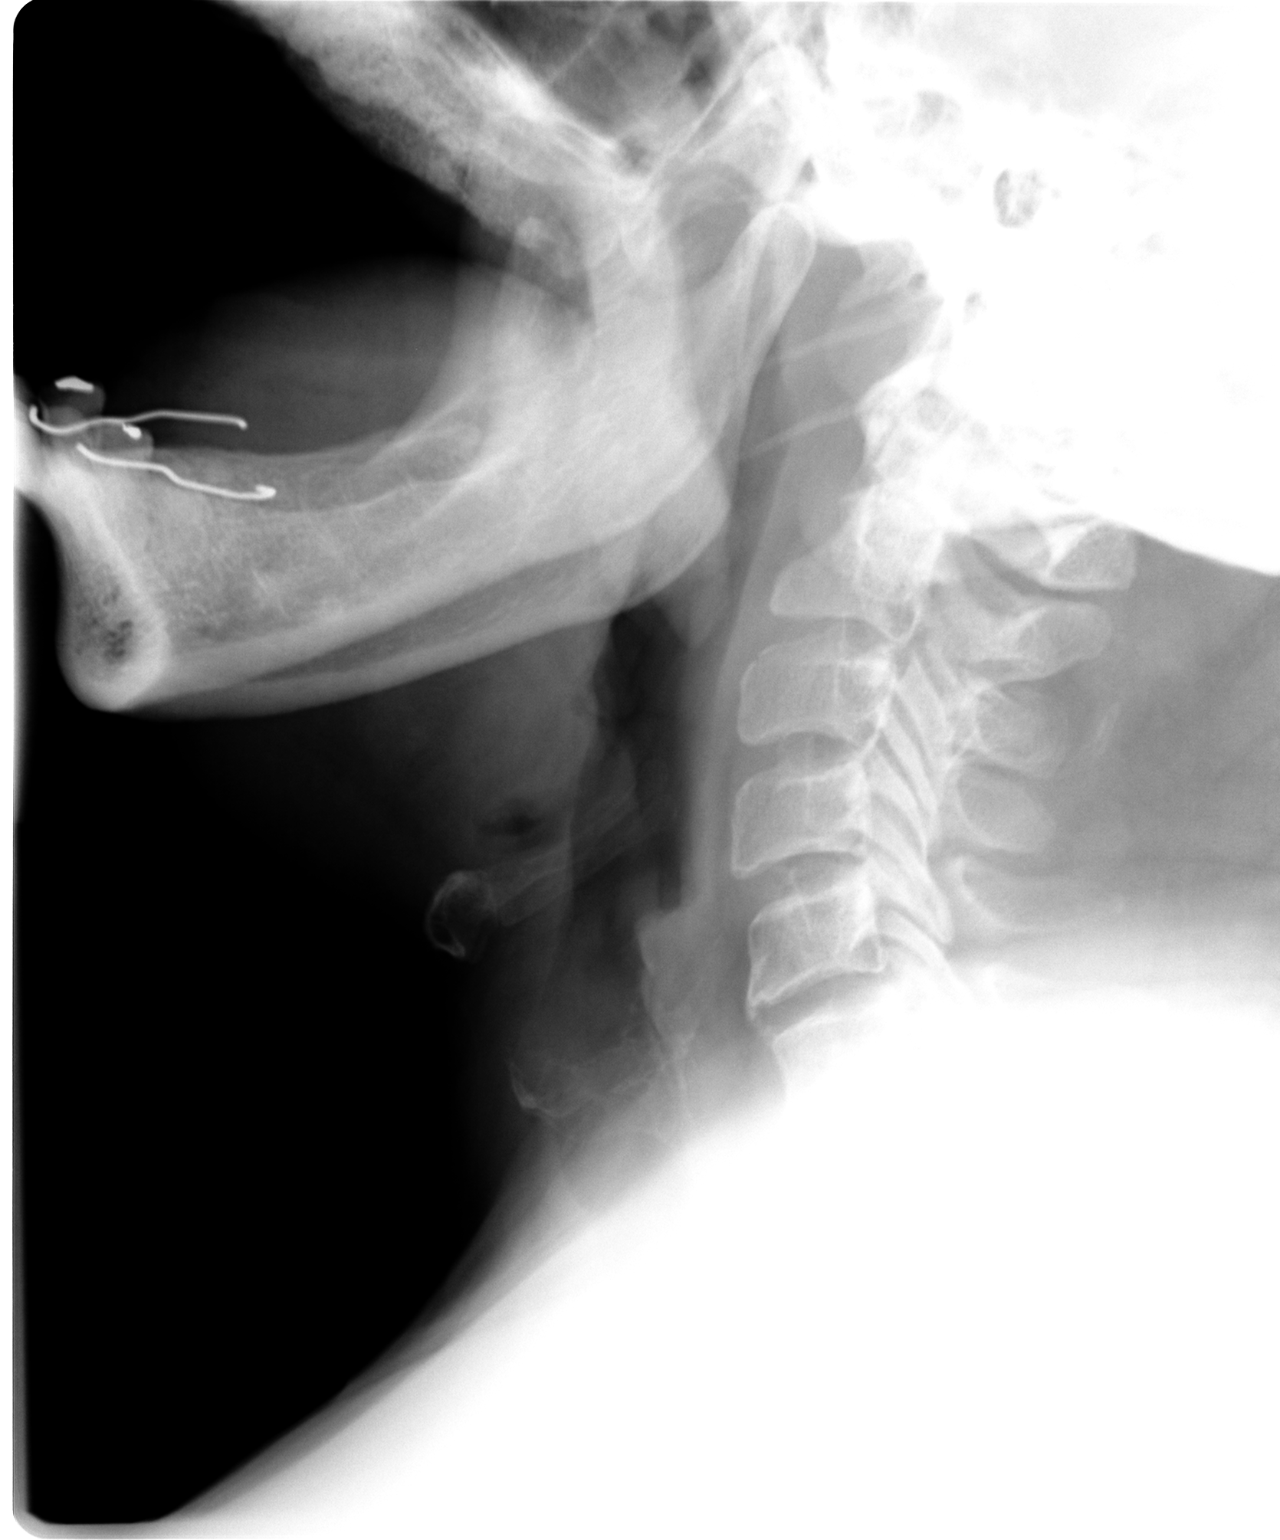

[view not recorded (2 of 2)]
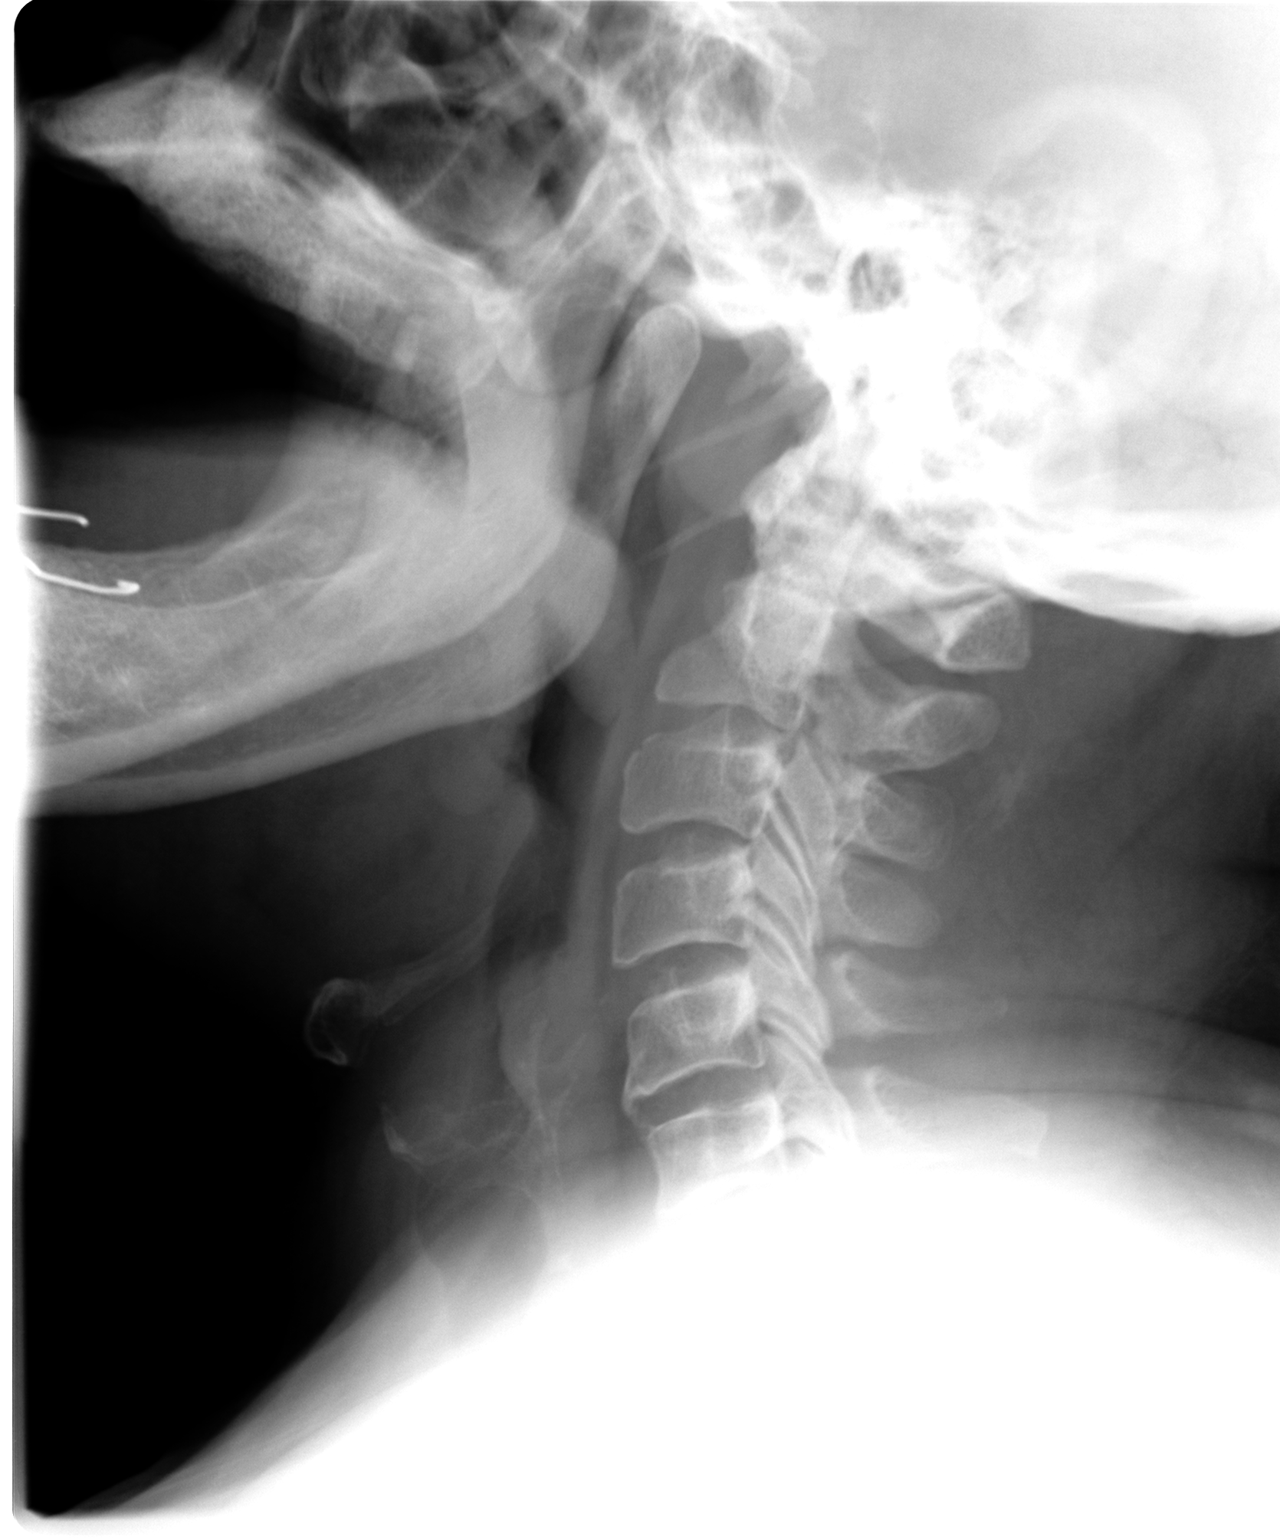

[2 of 2 positions shown; findings below may reference images not displayed]

FINDINGS: Lateral view of the neck is obtained using soft tissue technique. The
epiglottis is not well demonstrated as there is no air column anterior to the
epiglottal tissue. No convincing evidence for thickening the epiglottis.
Aryepiglottic folds are mildly irregular. There is no prevertebral soft tissue
swelling. There is no gas identified within the prevertebral soft tissues.
Palatine tonsils are somewhat prominent.
IMPRESSION: Epiglottis is not well demonstrated secondary to the lack of air anterior to the
epiglottis itself. There is no evidence to raise concern for epiglottitis, but
if this is suspected clinically, consider CT for further evaluation.

No prevertebral soft tissue swelling. There is no evidence for gas within the
prevertebral soft tissues.

## 2007-10-16 IMAGING — CR DG CHEST 2V
3 series · 3 of 3 positions shown · non-contrast
Comparison: 11/24/2005

CLINICAL DATA: Cough

[view not recorded (1 of 3)]
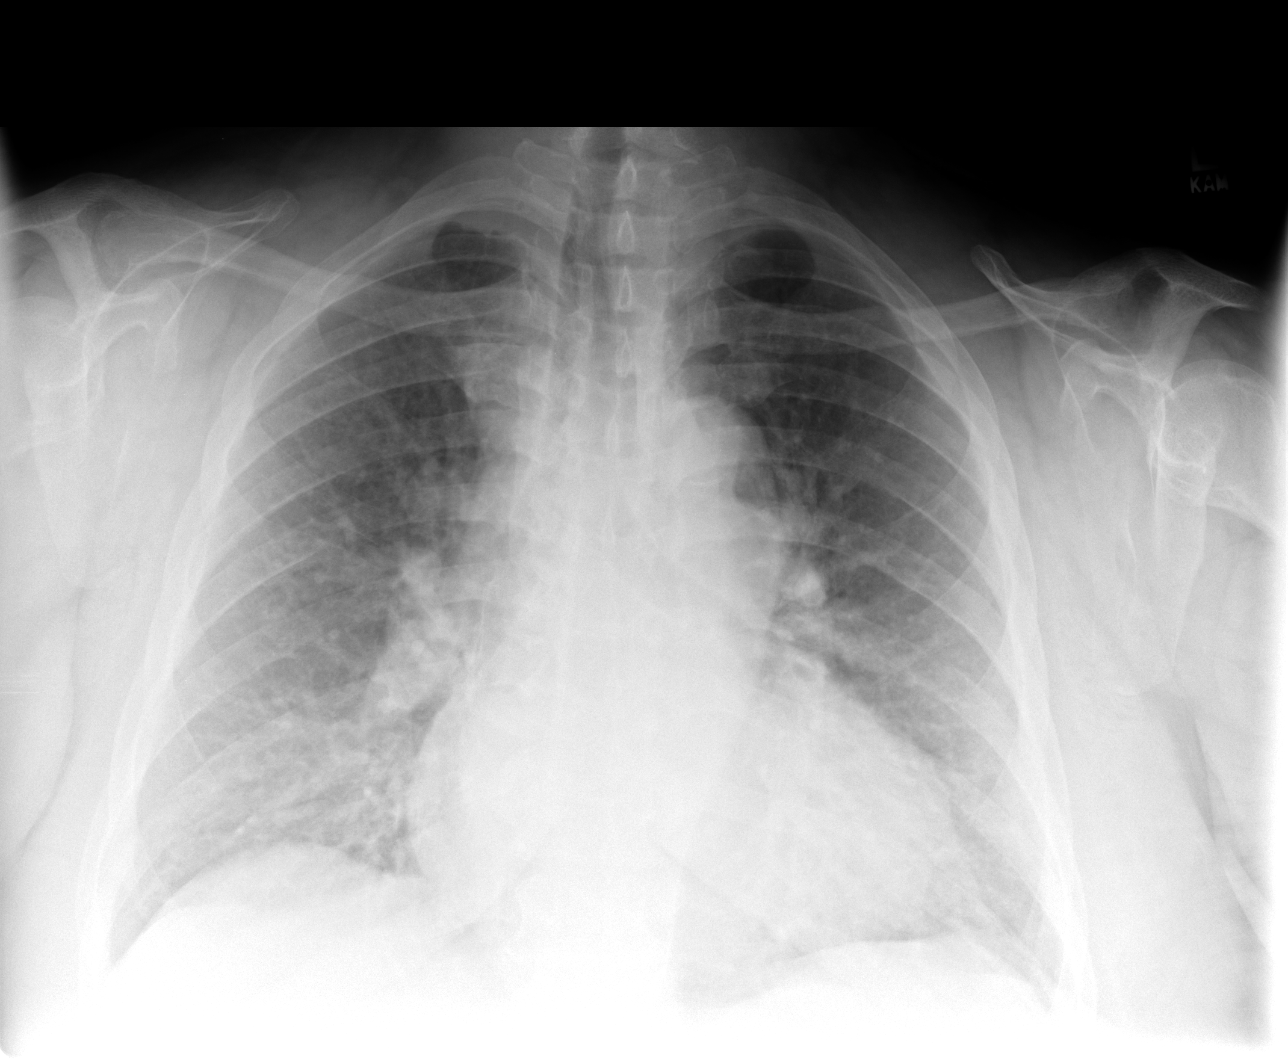

[view not recorded (2 of 3)]
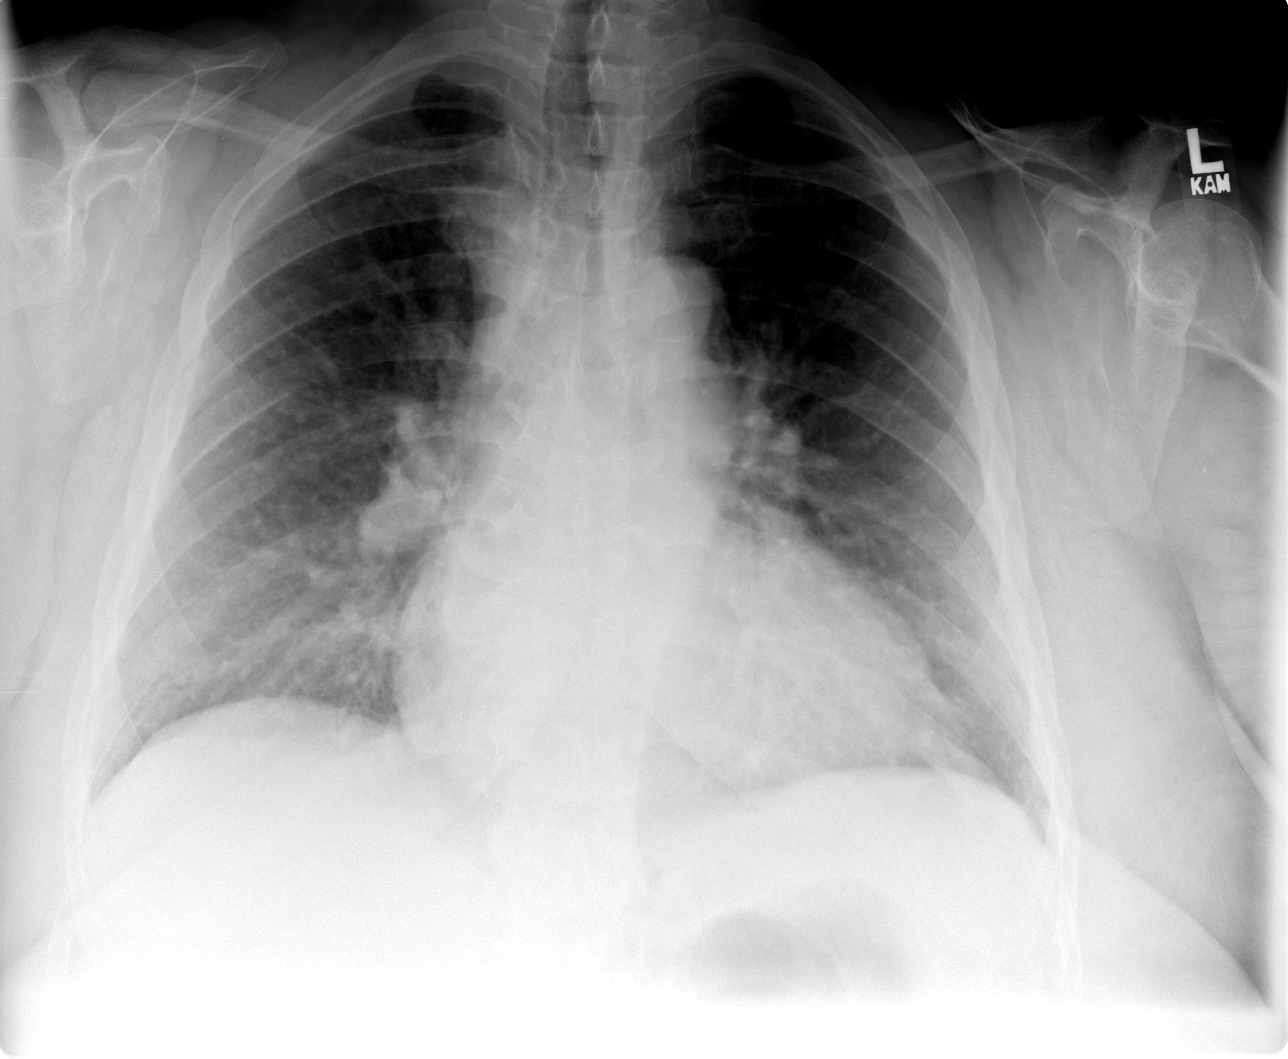

[view not recorded (3 of 3)]
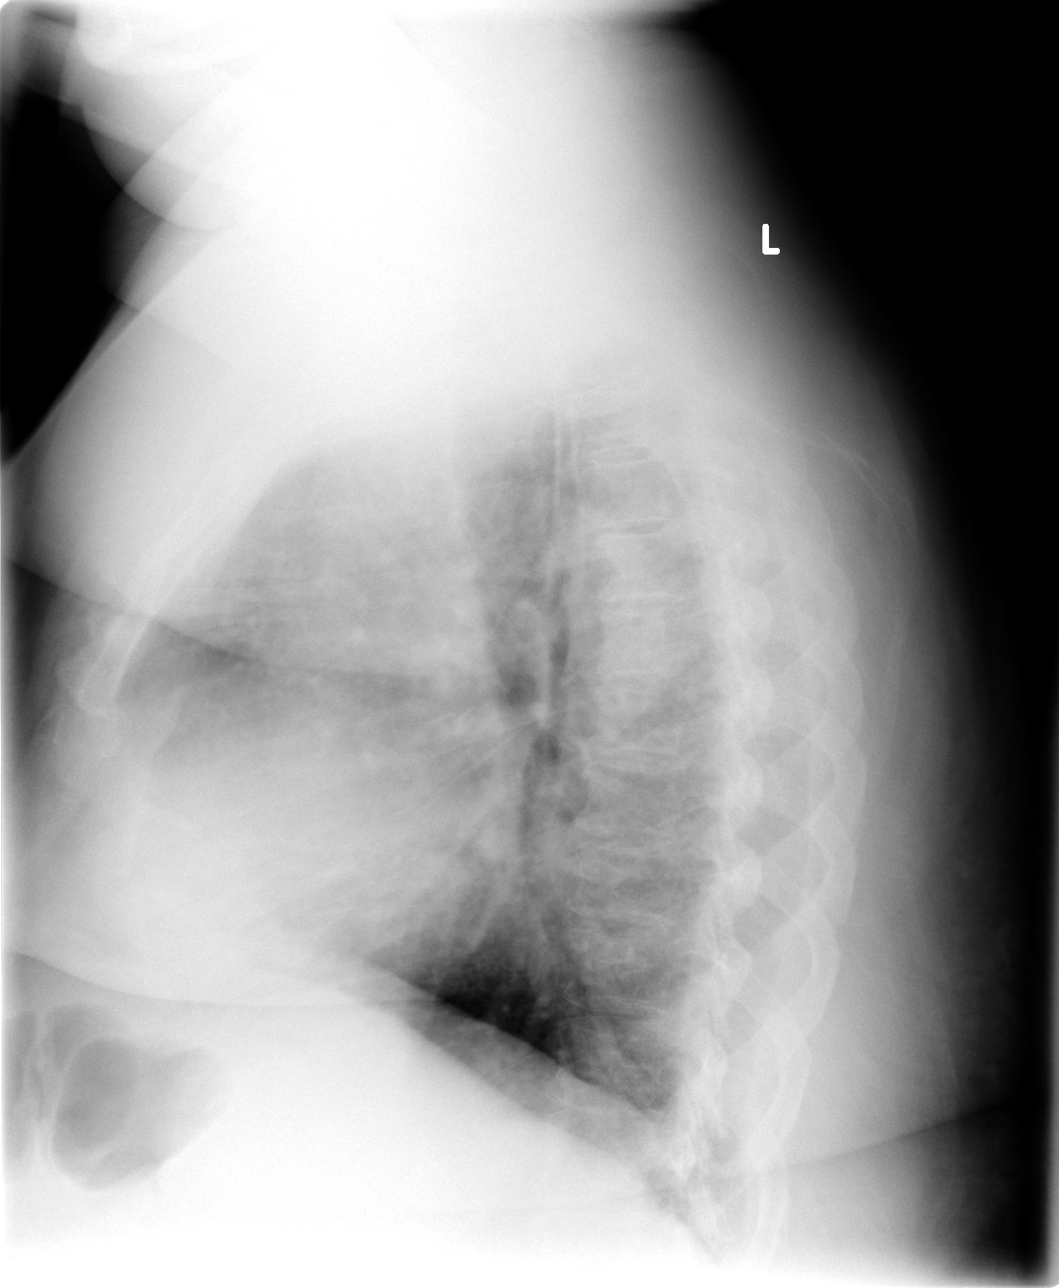

[3 of 3 positions shown; findings below may reference images not displayed]

CHEST - 2 VIEW:

Two-view chest shows enlargement of the cardiopericardial silhouette with
pulmonary vascular congestion. There's no evidence for focal airspace
consolidation or pleural effusion. Assessment of the lung parenchyma is somewhat
limited by the patient's large body habitus. Visualized bony structures are
unremarkable.
IMPRESSION: Stable enlargement of the cardiopericardial silhouette. There is pulmonary
vascular congestion without overt pulmonary edema or focal airspace
consolidation.

## 2007-10-30 IMAGING — CR DG CHEST 1V PORT
1 series · 1 of 1 positions shown · non-contrast
Comparison: none

CLINICAL DATA: Recurrent incarcerated ventral hernia.  Follow-up aeration.
 PORTABLE CHEST - 1 VIEW - 06/22/06:
 Taken at 7555 hours.   Comparison:   06/08/06.

[view not recorded]
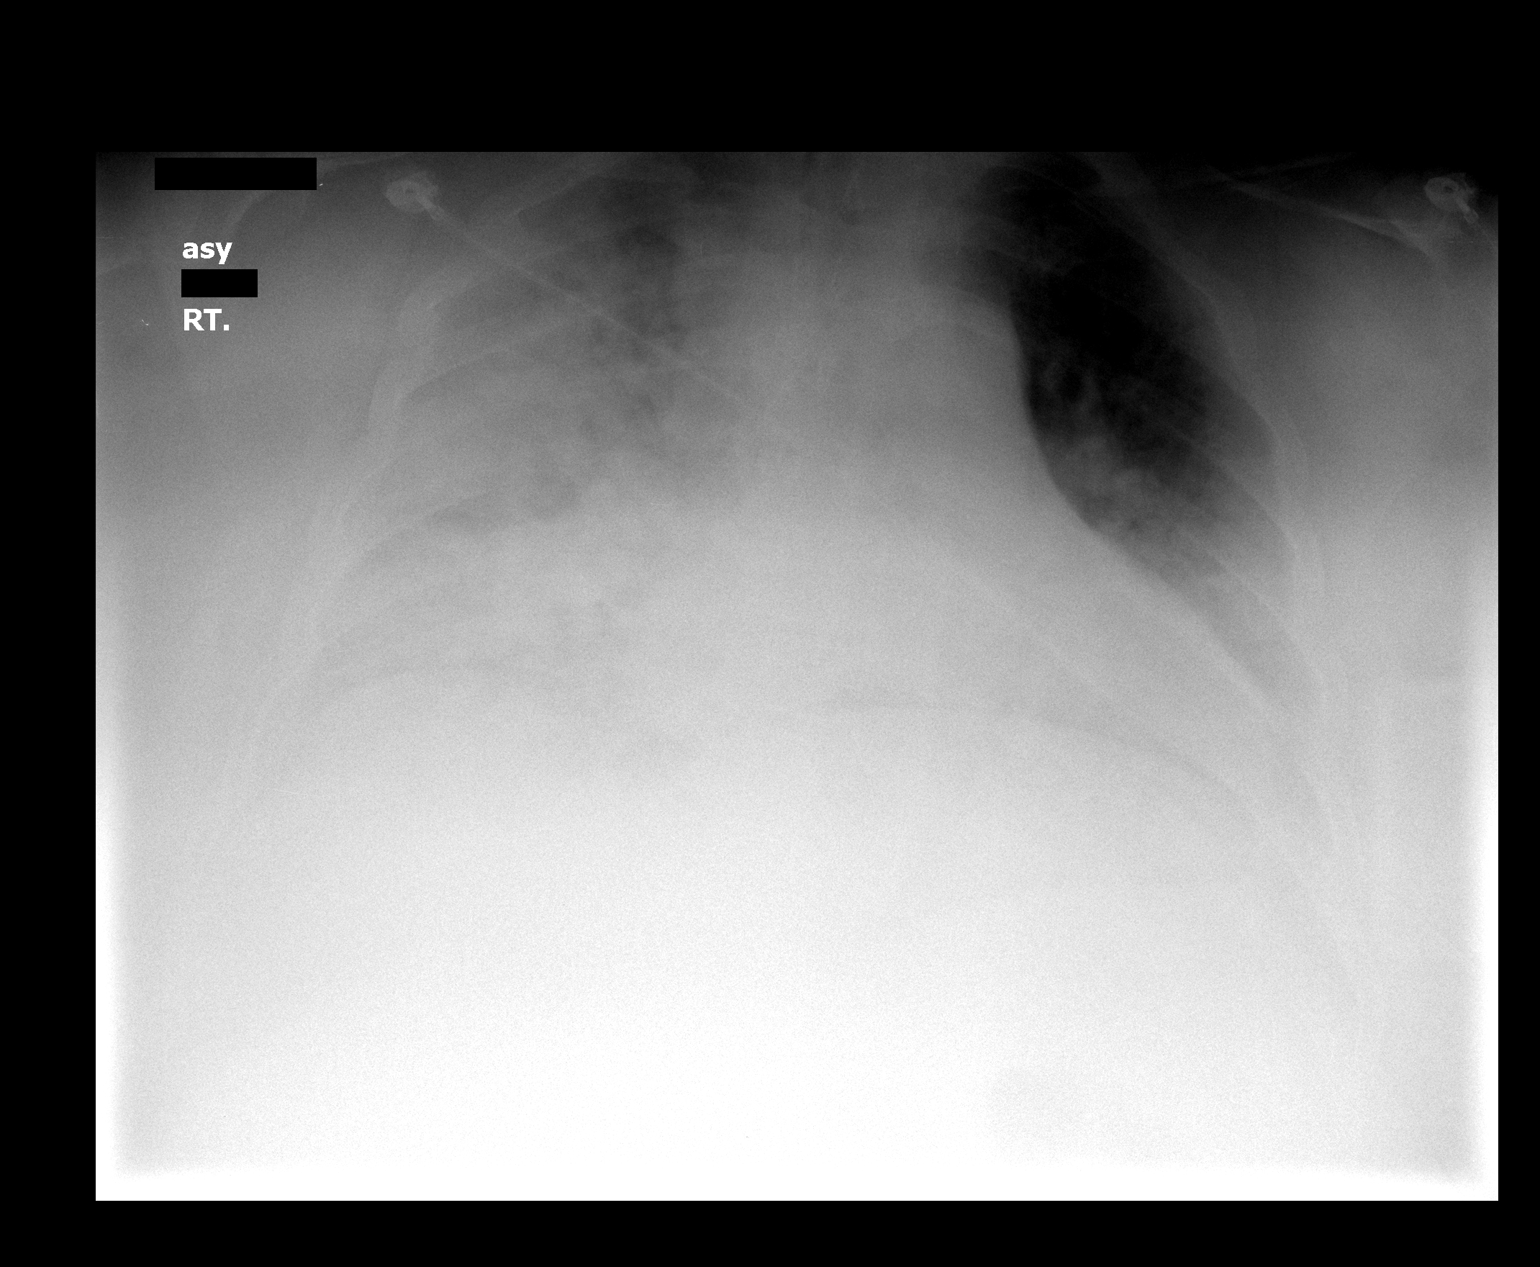

[1 of 1 positions shown; findings below may reference images not displayed]

FINDINGS: The patient has developed extensive air space disease throughout the right lung.  The patient is filmed in a low degree of inspiration.  There is probable cardiomegaly.
IMPRESSION: Development of extensive air space disease throughout the right lung.  Low inspiratory film.

## 2007-10-31 IMAGING — CR DG CHEST 1V PORT
1 series · 1 of 1 positions shown · non-contrast
Comparison: 06/22/06.

CLINICAL DATA: Recurrent incarcerated ventral hernia.  Severe air space disease.  Follow-up aeration. 
 PORTABLE CHEST   1 VIEW, 06/23/06, 0740 HOURS:

[view not recorded]
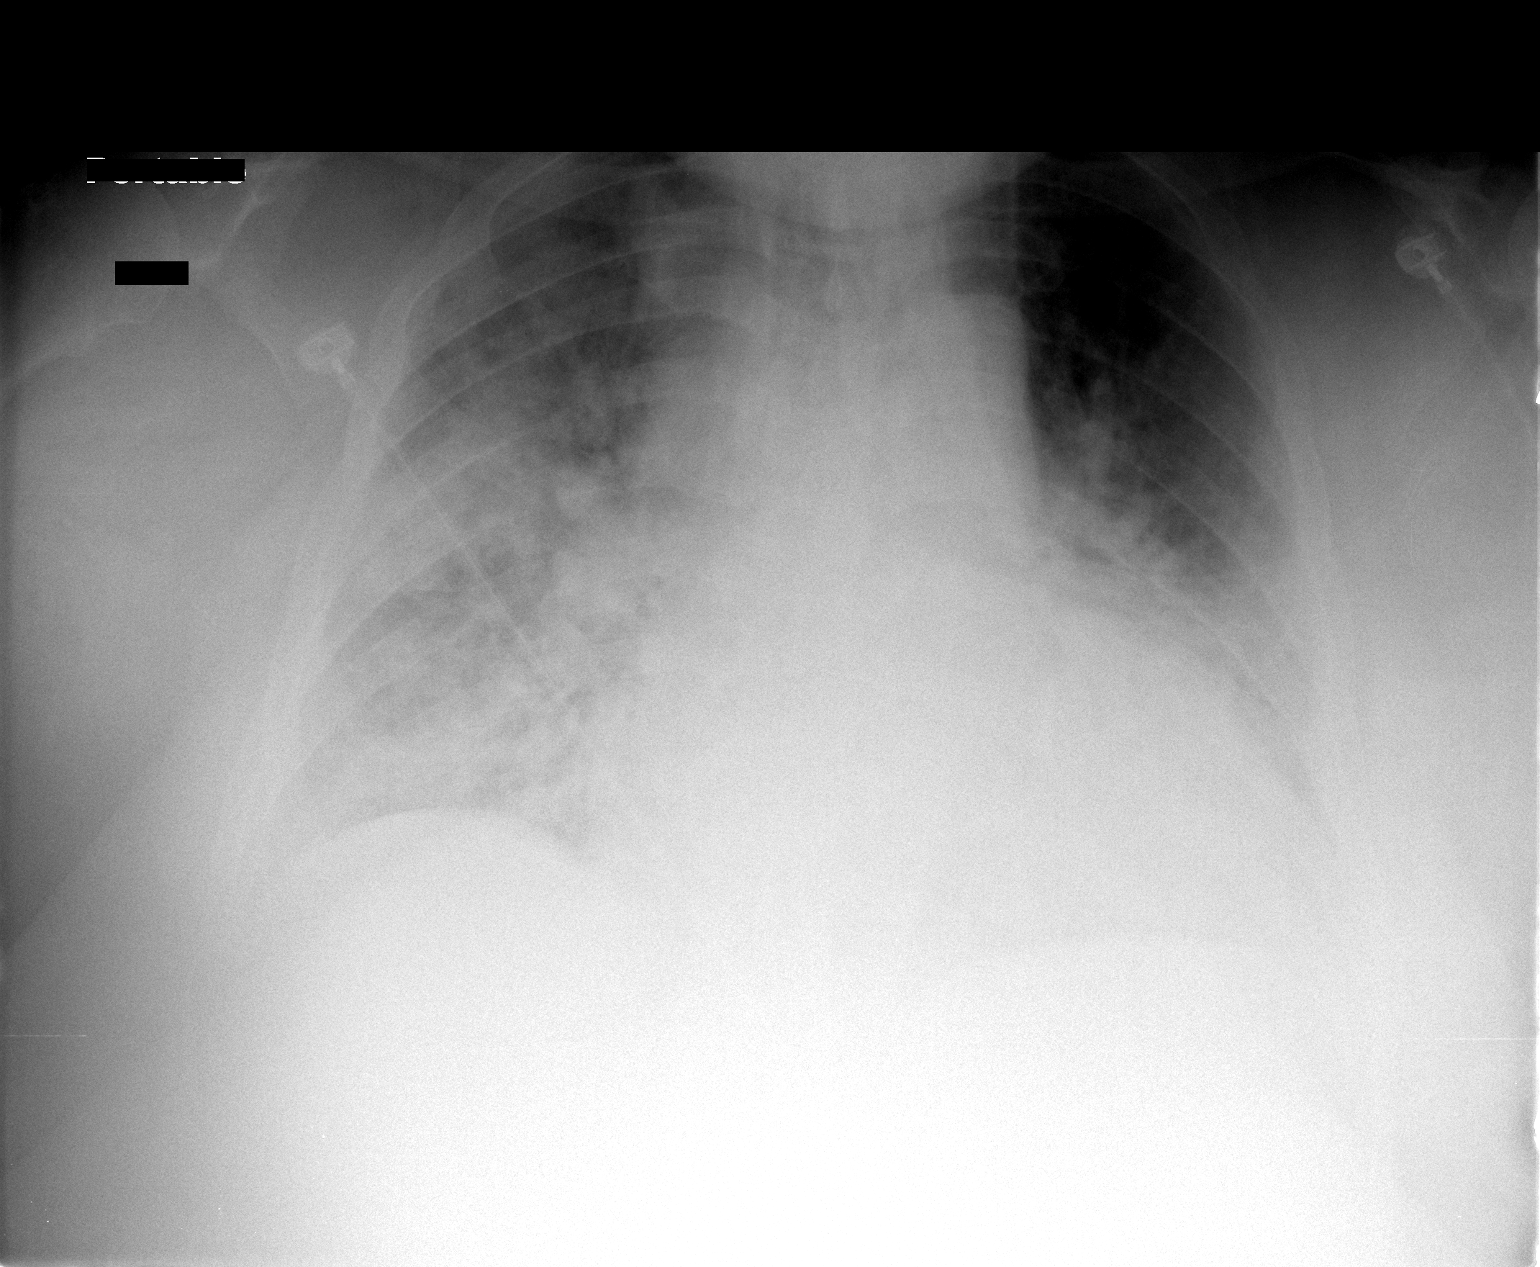

[1 of 1 positions shown; findings below may reference images not displayed]

FINDINGS: Extensive air space disease is seen throughout the right lung and there is also air space disease seen within the left perihilar region with stable cardiomegaly.  The aeration of the right lung has slightly improved intervally.  The aeration of the left perihilar region has mildly worsened.
IMPRESSION: Extensive bilateral air space disease with mild improvement in aeration of the right lung and slight worsening aeration of the left perihilar region.

## 2008-05-29 IMAGING — CR DG CHEST 1V PORT
1 series · 1 of 1 positions shown · non-contrast
Comparison: 06/28/2006

CLINICAL DATA: Dyspnea. CHF.

CHEST - 1 VIEW

[view not recorded]
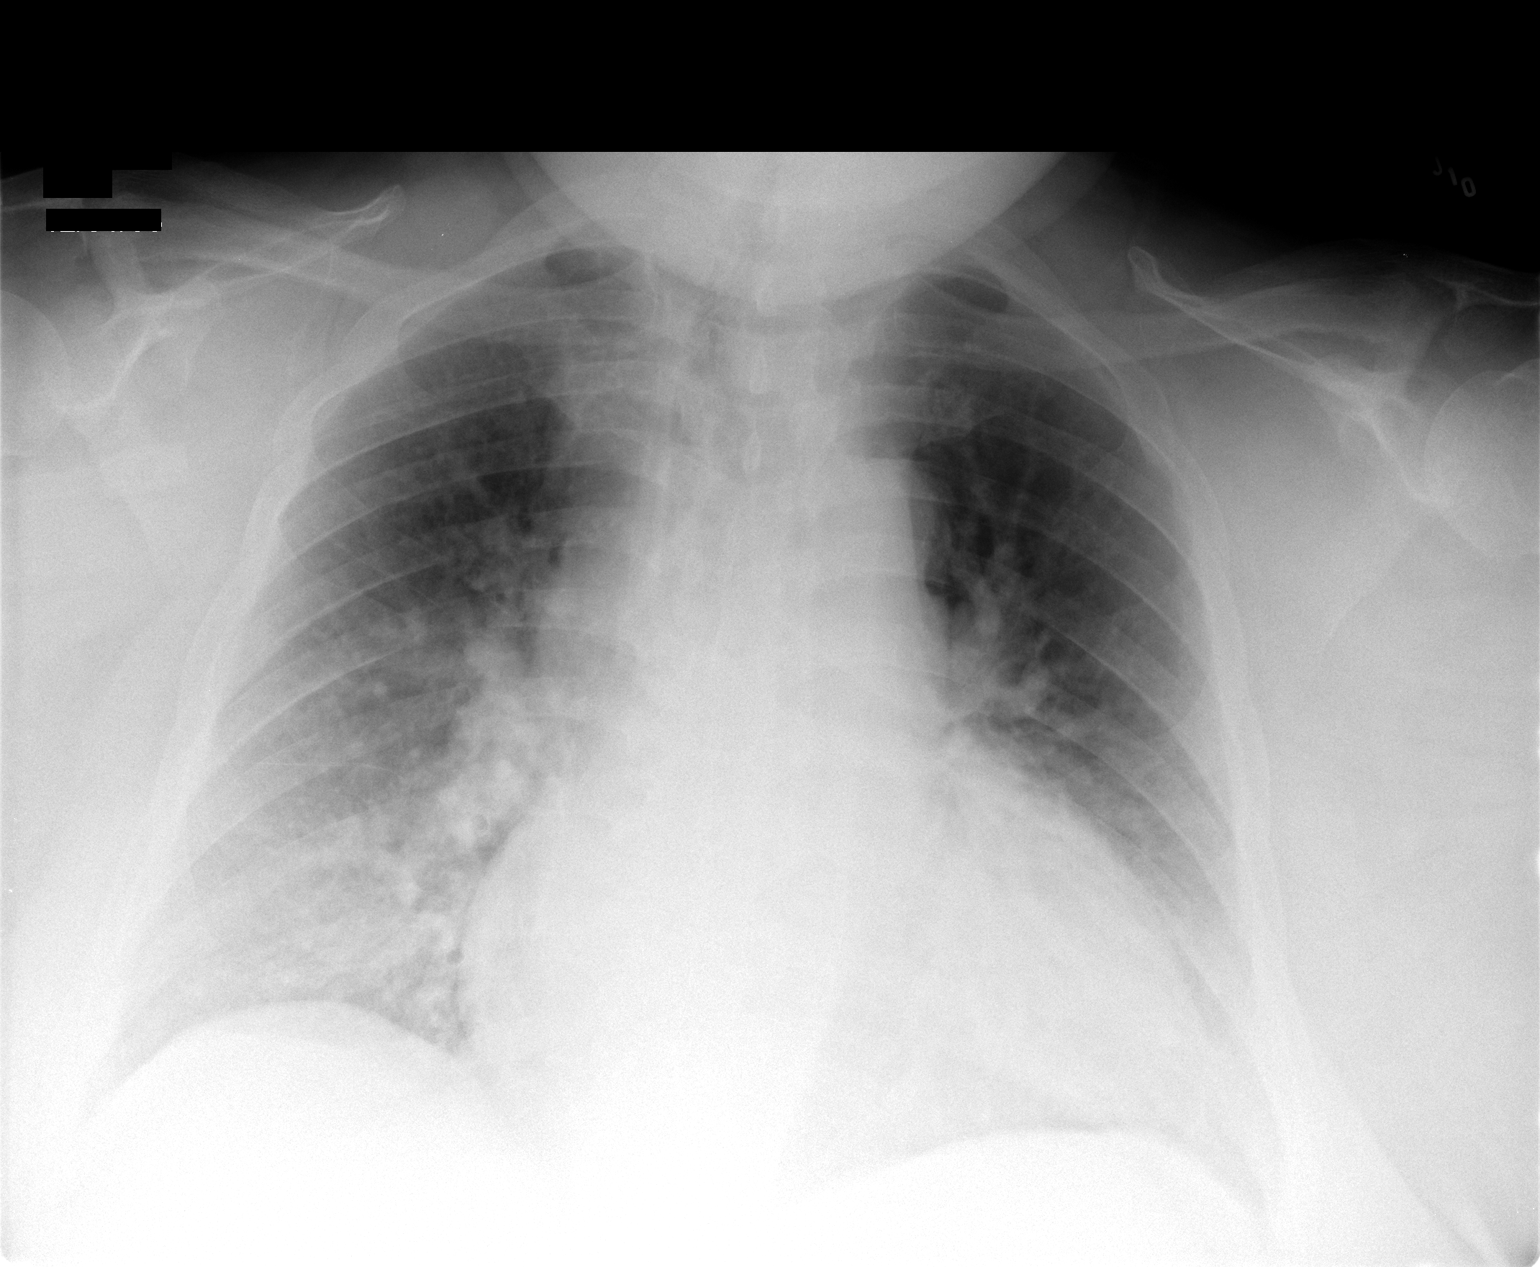

[1 of 1 positions shown; findings below may reference images not displayed]

FINDINGS: Midline trachea. Moderate cardiomegaly. Apparent fullness to the
superior mediastinum likely due to AP portable technique. No definite pleural
fluid. No pneumothorax. Thickening of the right minor fissure. Mild interstitial
edema. Cannot exclude concurrent right lower lobe airspace disease. This density
could be secondary to overlying soft tissue.

IMPRESSION

1. Moderate cardiomegaly with mild congestive heart failure.
2. Possible right lower lobe airspace opacity versus artifact from overlying
soft tissues. PA and lateral radiographs could help differentiate.

## 2008-09-09 ENCOUNTER — Inpatient Hospital Stay (HOSPITAL_COMMUNITY): Admission: EM | Admit: 2008-09-09 | Discharge: 2008-09-14 | Payer: Self-pay | Admitting: Emergency Medicine

## 2008-09-11 ENCOUNTER — Encounter (INDEPENDENT_AMBULATORY_CARE_PROVIDER_SITE_OTHER): Payer: Self-pay | Admitting: Internal Medicine

## 2008-09-13 ENCOUNTER — Ambulatory Visit: Payer: Self-pay | Admitting: Vascular Surgery

## 2008-09-13 ENCOUNTER — Encounter (INDEPENDENT_AMBULATORY_CARE_PROVIDER_SITE_OTHER): Payer: Self-pay | Admitting: Internal Medicine

## 2008-10-03 ENCOUNTER — Encounter (HOSPITAL_COMMUNITY): Admission: RE | Admit: 2008-10-03 | Discharge: 2009-01-01 | Payer: Self-pay | Admitting: Nephrology

## 2008-12-27 ENCOUNTER — Encounter: Admission: RE | Admit: 2008-12-27 | Discharge: 2008-12-27 | Payer: Self-pay | Admitting: Internal Medicine

## 2009-01-01 ENCOUNTER — Encounter (HOSPITAL_COMMUNITY): Admission: RE | Admit: 2009-01-01 | Discharge: 2009-03-30 | Payer: Self-pay | Admitting: Nephrology

## 2009-04-02 ENCOUNTER — Encounter (HOSPITAL_COMMUNITY): Admission: RE | Admit: 2009-04-02 | Discharge: 2009-07-01 | Payer: Self-pay | Admitting: Nephrology

## 2009-07-02 ENCOUNTER — Encounter (HOSPITAL_COMMUNITY): Admission: RE | Admit: 2009-07-02 | Discharge: 2009-09-30 | Payer: Self-pay | Admitting: Nephrology

## 2009-10-01 ENCOUNTER — Encounter (HOSPITAL_COMMUNITY)
Admission: RE | Admit: 2009-10-01 | Discharge: 2009-12-30 | Payer: Self-pay | Source: Home / Self Care | Attending: Nephrology | Admitting: Nephrology

## 2009-12-23 ENCOUNTER — Emergency Department (HOSPITAL_COMMUNITY)
Admission: EM | Admit: 2009-12-23 | Discharge: 2009-12-23 | Payer: Self-pay | Source: Home / Self Care | Admitting: Emergency Medicine

## 2010-01-08 ENCOUNTER — Encounter (HOSPITAL_COMMUNITY)
Admission: RE | Admit: 2010-01-08 | Discharge: 2010-02-19 | Payer: Self-pay | Source: Home / Self Care | Attending: Nephrology | Admitting: Nephrology

## 2010-01-17 IMAGING — CR DG CHEST 1V PORT
1 series · 1 of 1 positions shown · non-contrast
Comparison: 01/20/2007

CLINICAL DATA: Shortness of breath

PORTABLE CHEST - 1 VIEW

[view not recorded]
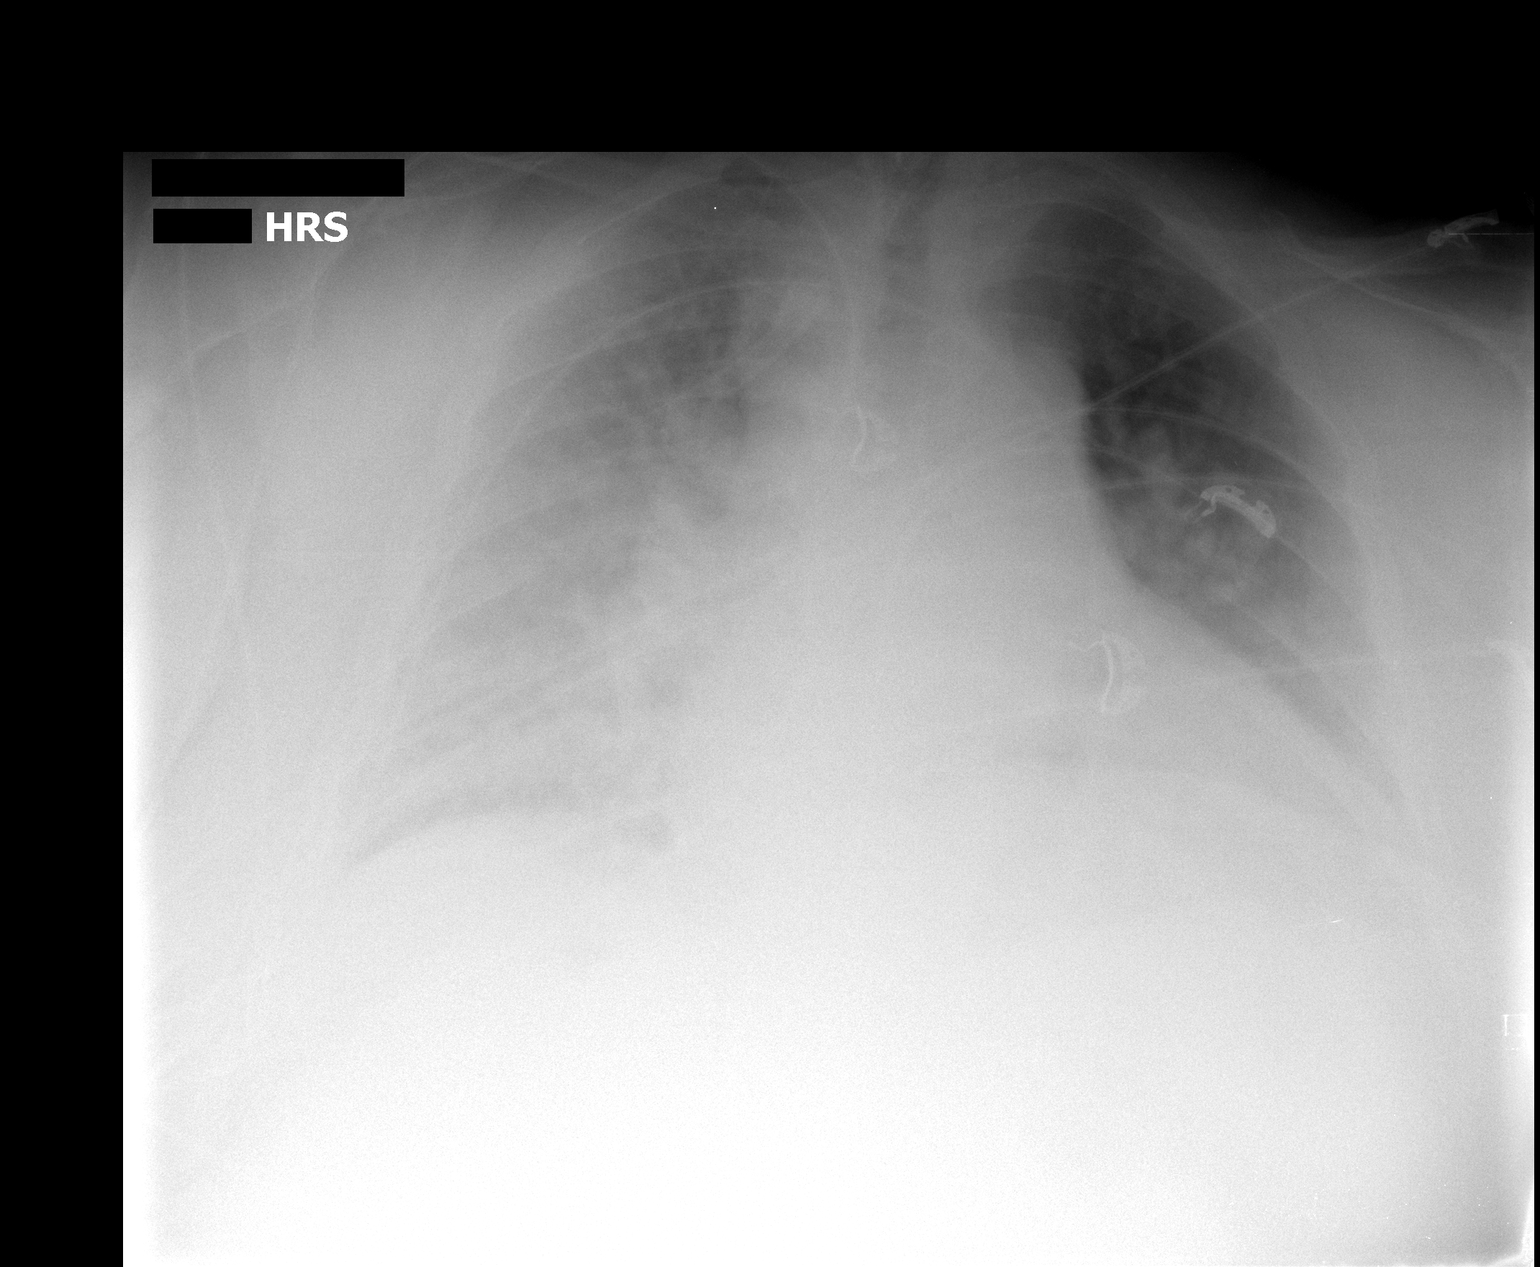

[1 of 1 positions shown; findings below may reference images not displayed]

FINDINGS: Lower lung volumes.  Worsening pulmonary vascular
congestion and interstitial edema.  No definite effusion.  Stable
cardiomegaly.
IMPRESSION: 1.  Worsening pulmonary vascular congestion and interstitial edema.
2.  Stable cardiomegaly.

## 2010-01-18 IMAGING — CR DG CHEST 1V PORT
1 series · 1 of 1 positions shown · non-contrast
Comparison: 09/09/2008

CLINICAL DATA: Decompensated CHF.

PORTABLE CHEST - 1 VIEW

[AP]
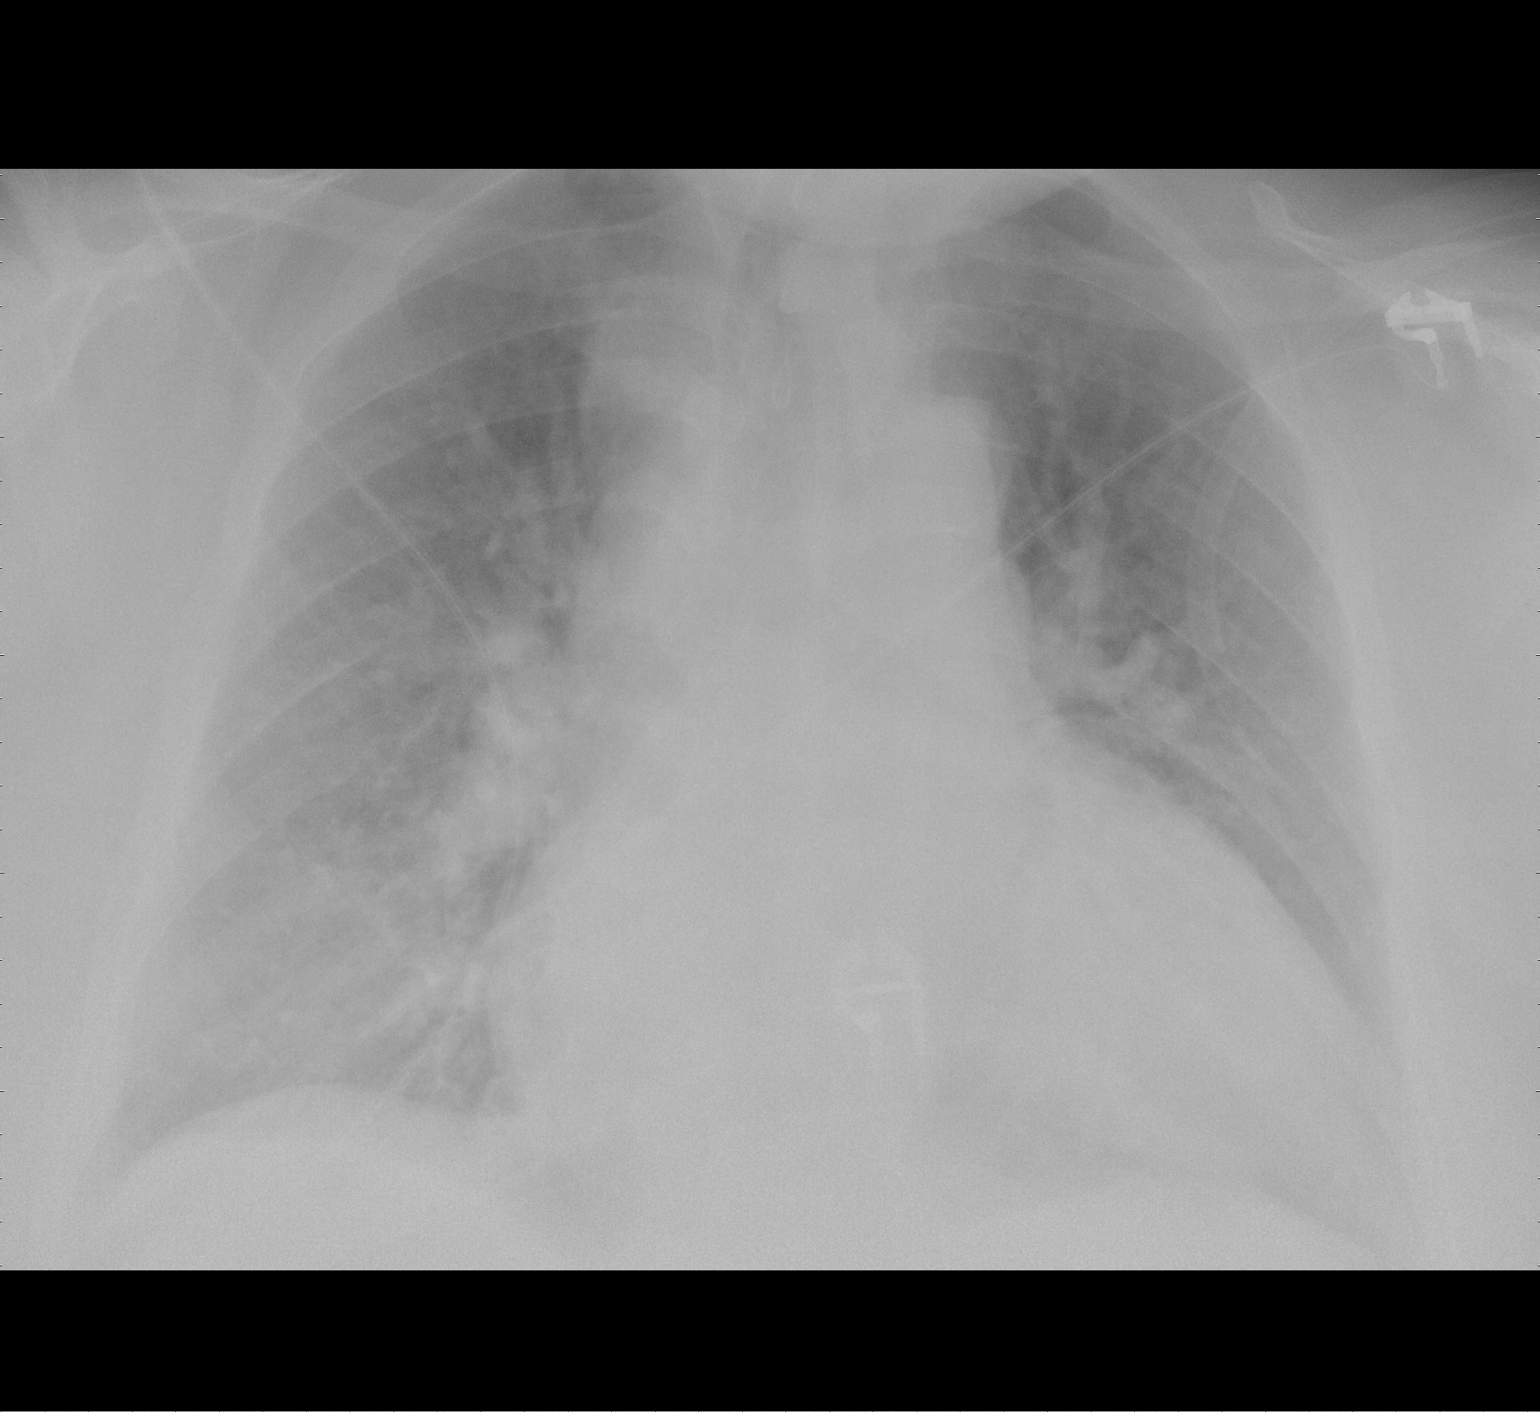

[1 of 1 positions shown; findings below may reference images not displayed]

FINDINGS: The heart is enlarged.  Persistent pulmonary edema, but
slight improved aeration since prior study.  No definite pleural
effusions.
IMPRESSION: Persistent cardiac enlargement and pulmonary edema but slight
improved aeration since earlier film.

## 2010-01-19 IMAGING — US US RENAL PORT
1 series · 14 of 25 positions shown · non-contrast
Comparison: Unenhanced CT abdomen pelvis 12/08/2005 and 12/28/2004.

CLINICAL DATA: Chronic kidney disease.  History of hypertension and
diabetes.

RENAL/URINARY TRACT ULTRASOUND COMPLETE 09/11/2008:

[Series 1: us renal port · 0.33mm/px · 14 of 27 slices shown]
[im 1/27]
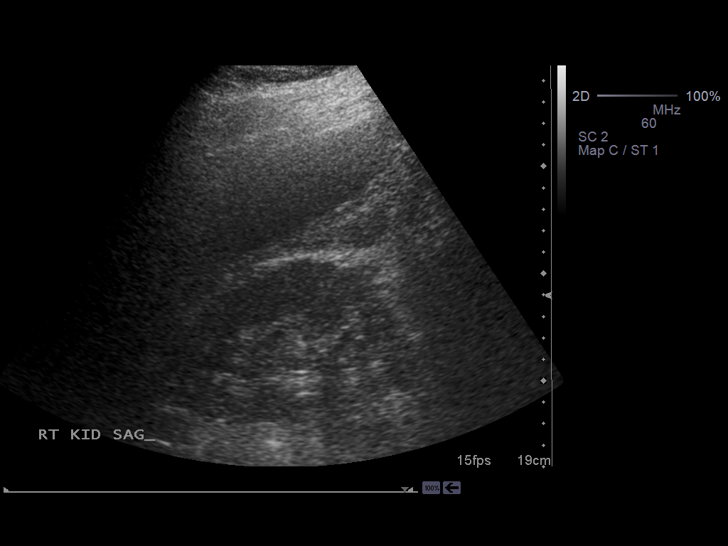
[im 3/27]
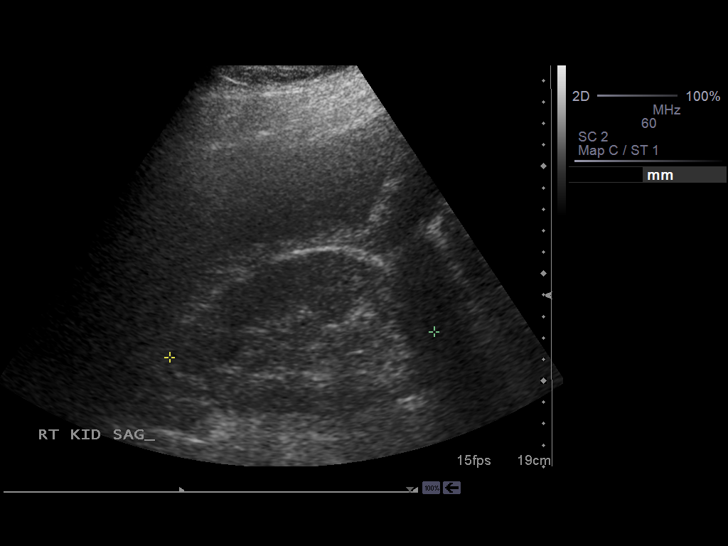
[im 5/27]
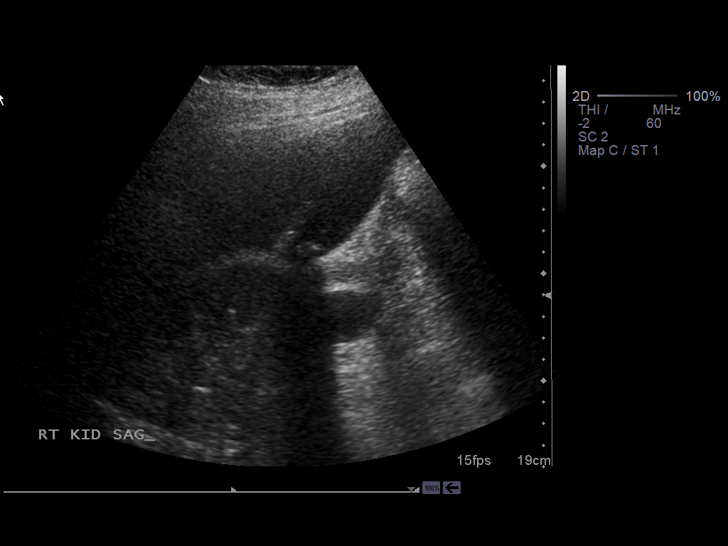
[im 7/27]
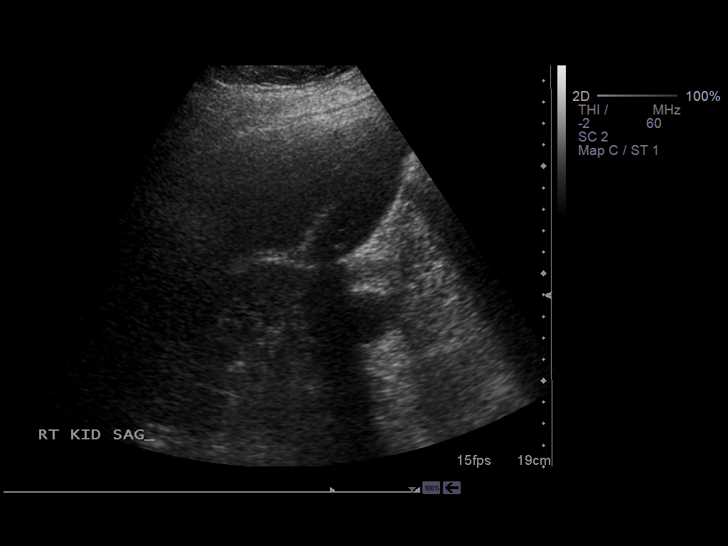
[im 9/27]
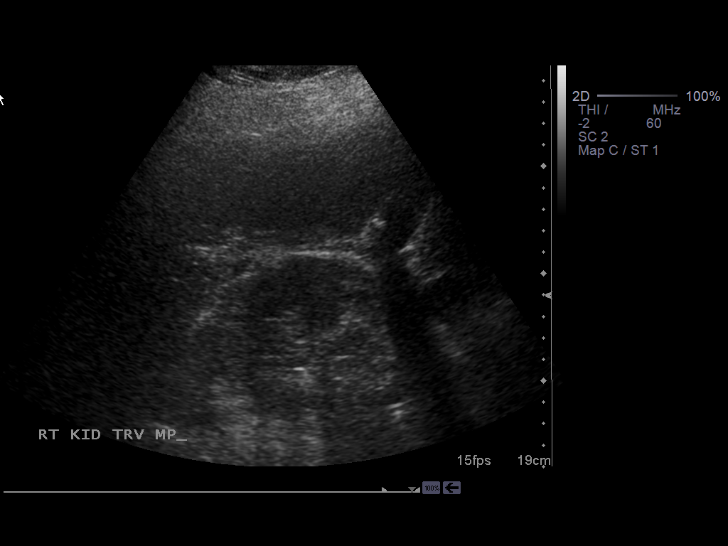
[im 10/27]
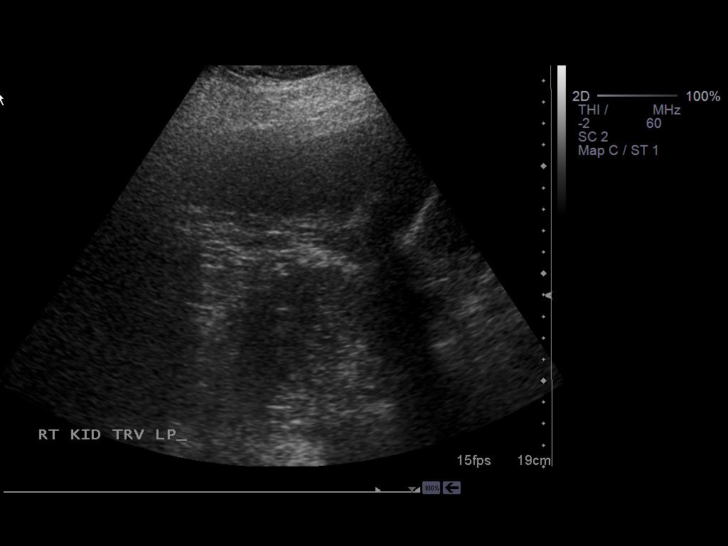
[im 12/27]
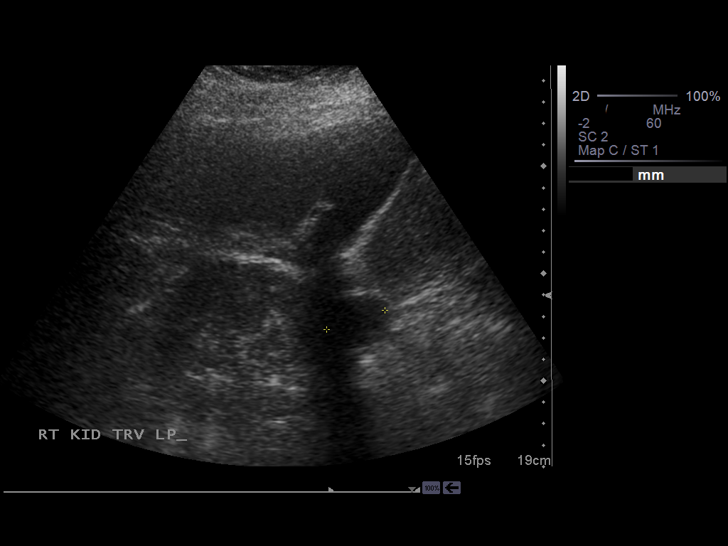
[im 15/27]
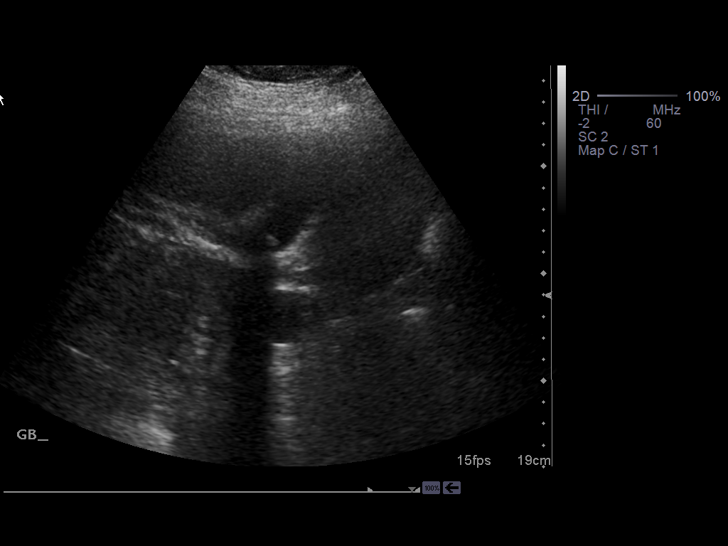
[im 17/27]
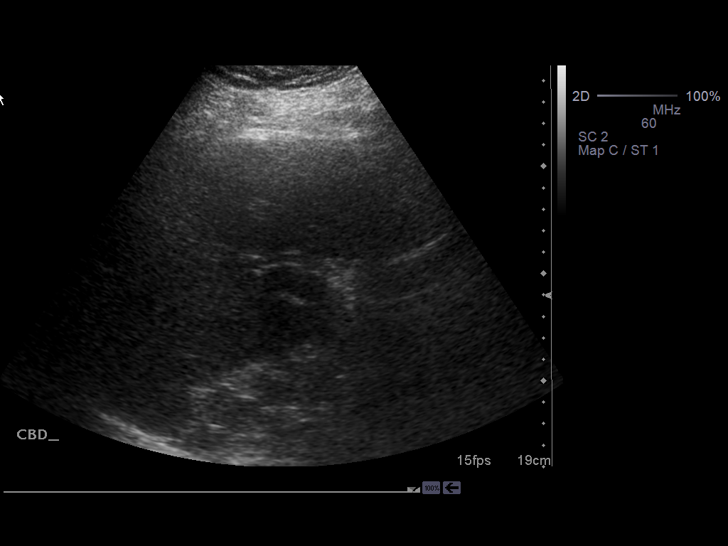
[im 18/27]
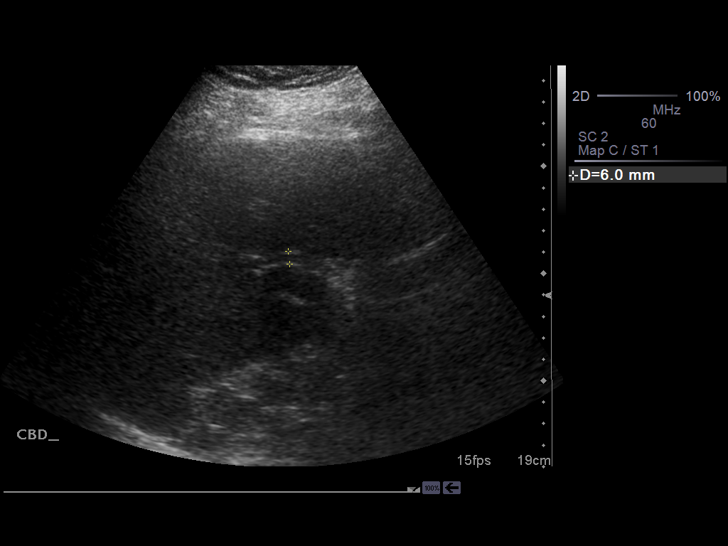
[im 20/27]
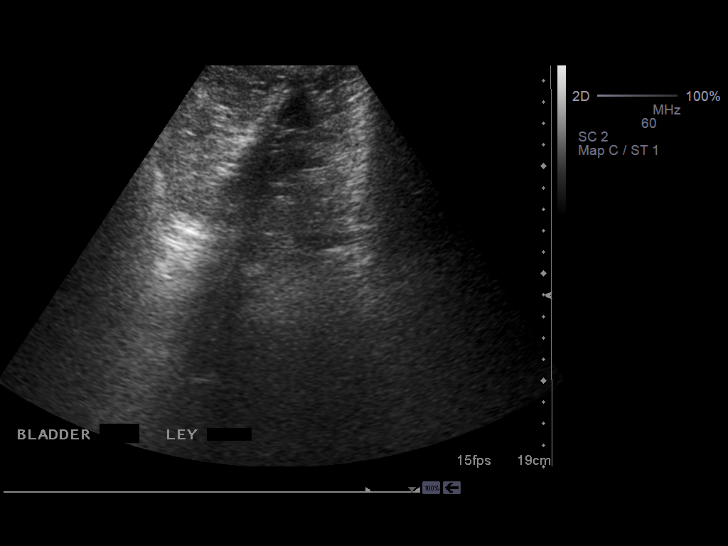
[im 22/27]
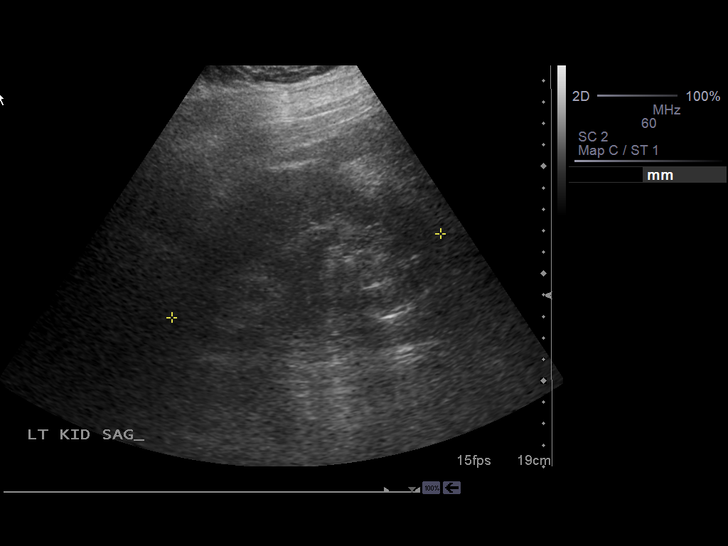
[im 24/27]
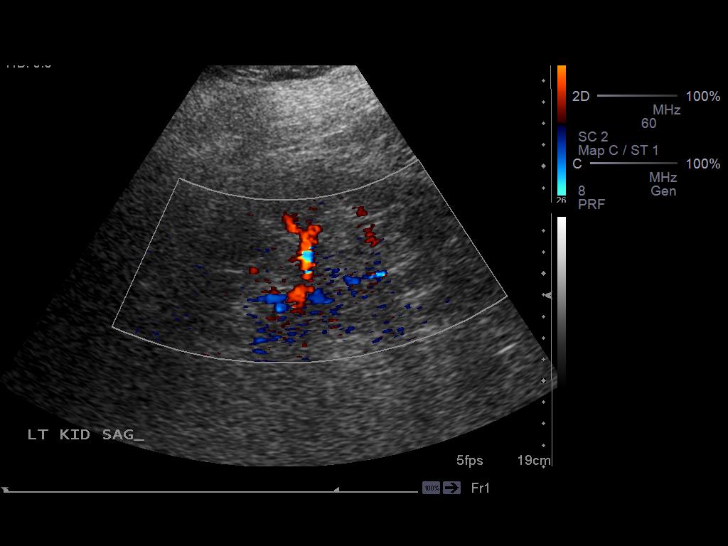
[im 27/27]
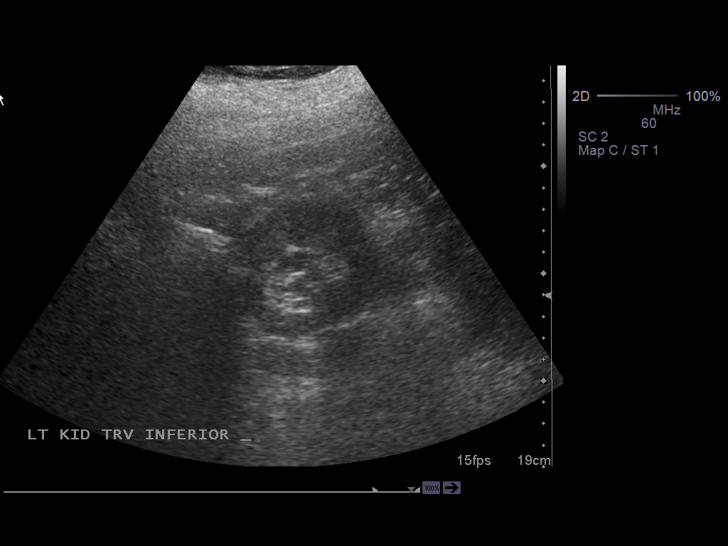

[14 of 25 positions shown; findings below may reference images not displayed]

FINDINGS: The examination was performed portably at the patient's
bedside.

Right Kidney:  No hydronephrosis.  Well-preserved cortex.  Mildly
increased parenchymal echotexture.  Approximate 3 cm simple cyst
arising from the lower pole.  No other focal parenchymal
abnormalities.  Approximately 12.4 cm length.

Left Kidney:  No hydronephrosis.  Well-preserved cortex.  Mildly
increased parenchymal echotexture.  No focal parenchymal
abnormalities.  Approximately 13.1 cm length.

Bladder:  Decompressed by Foley catheter.

Other findings:  Numerous shadowing gallstones in a contracted
gallbladder.
IMPRESSION: 1.  Mildly echogenic kidneys consistent with chronic renal disease.
2.  No hydronephrosis.  3 cm cyst lower pole right kidney.  No
significant parenchymal abnormalities involving either kidney.
3.  Incidental note of cholelithiasis.

## 2010-02-10 ENCOUNTER — Encounter: Payer: Self-pay | Admitting: Neurology

## 2010-02-10 ENCOUNTER — Encounter: Payer: Self-pay | Admitting: Cardiovascular Disease

## 2010-02-10 ENCOUNTER — Encounter: Payer: Self-pay | Admitting: Neurosurgery

## 2010-02-11 ENCOUNTER — Encounter: Payer: Self-pay | Admitting: Internal Medicine

## 2010-02-12 LAB — IRON AND TIBC: TIBC: 174 ug/dL — ABNORMAL LOW (ref 250–470)

## 2010-02-12 LAB — FERRITIN: Ferritin: 1065 ng/mL — ABNORMAL HIGH (ref 10–291)

## 2010-02-25 ENCOUNTER — Ambulatory Visit (HOSPITAL_COMMUNITY): Payer: Medicare (Managed Care) | Attending: Nephrology

## 2010-02-25 ENCOUNTER — Other Ambulatory Visit: Payer: Self-pay

## 2010-02-25 DIAGNOSIS — N183 Chronic kidney disease, stage 3 unspecified: Secondary | ICD-10-CM | POA: Insufficient documentation

## 2010-02-25 DIAGNOSIS — D631 Anemia in chronic kidney disease: Secondary | ICD-10-CM | POA: Insufficient documentation

## 2010-02-25 DIAGNOSIS — D638 Anemia in other chronic diseases classified elsewhere: Secondary | ICD-10-CM | POA: Insufficient documentation

## 2010-03-04 ENCOUNTER — Encounter (HOSPITAL_COMMUNITY): Payer: Medicare (Managed Care) | Attending: Nephrology

## 2010-03-11 ENCOUNTER — Encounter (HOSPITAL_COMMUNITY): Payer: Medicare (Managed Care)

## 2010-03-11 ENCOUNTER — Encounter (HOSPITAL_COMMUNITY)
Admission: RE | Admit: 2010-03-11 | Discharge: 2010-03-11 | Payer: Medicare (Managed Care) | Source: Ambulatory Visit | Attending: Nephrology | Admitting: Nephrology

## 2010-03-11 ENCOUNTER — Other Ambulatory Visit: Payer: Self-pay

## 2010-03-12 LAB — POCT HEMOGLOBIN-HEMACUE
Hemoglobin: 8.2 g/dL — ABNORMAL LOW (ref 12.0–15.0)
Hemoglobin: 9.2 g/dL — ABNORMAL LOW (ref 12.0–15.0)

## 2010-03-13 ENCOUNTER — Inpatient Hospital Stay (INDEPENDENT_AMBULATORY_CARE_PROVIDER_SITE_OTHER)
Admission: RE | Admit: 2010-03-13 | Discharge: 2010-03-13 | Disposition: A | Discharge status: Home / Self Care | Payer: No Typology Code available for payment source | Source: Ambulatory Visit | Attending: Family Medicine | Admitting: Family Medicine

## 2010-03-13 ENCOUNTER — Emergency Department (HOSPITAL_COMMUNITY)
Admission: EM | Admit: 2010-03-13 | Discharge: 2010-03-13 | Disposition: A | Discharge status: Home / Self Care | Payer: Medicare (Managed Care) | Attending: Emergency Medicine | Admitting: Emergency Medicine

## 2010-03-13 DIAGNOSIS — M79609 Pain in unspecified limb: Secondary | ICD-10-CM | POA: Insufficient documentation

## 2010-03-13 DIAGNOSIS — N289 Disorder of kidney and ureter, unspecified: Secondary | ICD-10-CM

## 2010-03-13 DIAGNOSIS — I509 Heart failure, unspecified: Secondary | ICD-10-CM | POA: Insufficient documentation

## 2010-03-13 DIAGNOSIS — E119 Type 2 diabetes mellitus without complications: Secondary | ICD-10-CM | POA: Insufficient documentation

## 2010-03-13 DIAGNOSIS — I10 Essential (primary) hypertension: Secondary | ICD-10-CM | POA: Insufficient documentation

## 2010-03-13 DIAGNOSIS — M109 Gout, unspecified: Secondary | ICD-10-CM | POA: Insufficient documentation

## 2010-03-13 LAB — POCT I-STAT, CHEM 8
BUN: 37 mg/dL — ABNORMAL HIGH (ref 6–23)
Creatinine, Ser: 2.5 mg/dL — ABNORMAL HIGH (ref 0.4–1.2)
Glucose, Bld: 136 mg/dL — ABNORMAL HIGH (ref 70–99)
Potassium: 3.8 mEq/L (ref 3.5–5.1)
Sodium: 144 mEq/L (ref 135–145)

## 2010-03-25 ENCOUNTER — Other Ambulatory Visit: Payer: Self-pay

## 2010-03-25 ENCOUNTER — Encounter (HOSPITAL_COMMUNITY): Payer: No Typology Code available for payment source | Attending: Nephrology

## 2010-03-25 DIAGNOSIS — N183 Chronic kidney disease, stage 3 unspecified: Secondary | ICD-10-CM | POA: Insufficient documentation

## 2010-03-25 DIAGNOSIS — D638 Anemia in other chronic diseases classified elsewhere: Secondary | ICD-10-CM | POA: Insufficient documentation

## 2010-04-01 LAB — RENAL FUNCTION PANEL
CO2: 27 mEq/L (ref 19–32)
Calcium: 8.8 mg/dL (ref 8.4–10.5)
Creatinine, Ser: 2.38 mg/dL — ABNORMAL HIGH (ref 0.4–1.2)
GFR calc Af Amer: 25 mL/min — ABNORMAL LOW (ref >60)
GFR calc non Af Amer: 20 mL/min — ABNORMAL LOW (ref >60)

## 2010-04-01 LAB — FERRITIN: Ferritin: 1001 ng/mL — ABNORMAL HIGH (ref 10–291)

## 2010-04-01 LAB — IRON AND TIBC
Iron: 70 ug/dL (ref 42–135)
UIBC: 115 ug/dL

## 2010-04-02 LAB — DIFFERENTIAL
Lymphocytes Relative: 24 % (ref 12–46)
Lymphs Abs: 2.1 10*3/uL (ref 0.7–4.0)
Monocytes Relative: 8 % (ref 3–12)
Neutro Abs: 5.7 10*3/uL (ref 1.7–7.7)
Neutrophils Relative %: 66 % (ref 43–77)

## 2010-04-02 LAB — CBC
HCT: 29.7 % — ABNORMAL LOW (ref 36.0–46.0)
Hemoglobin: 9.3 g/dL — ABNORMAL LOW (ref 12.0–15.0)
MCH: 28.5 pg (ref 26.0–34.0)
MCV: 91.1 fL (ref 78.0–100.0)
Platelets: 177 10*3/uL (ref 150–400)
RBC: 3.26 MIL/uL — ABNORMAL LOW (ref 3.87–5.11)

## 2010-04-02 LAB — APTT: aPTT: 34 seconds (ref 24–37)

## 2010-04-02 LAB — PROTIME-INR: INR: 1.03 (ref 0.00–1.49)

## 2010-04-03 LAB — POCT HEMOGLOBIN-HEMACUE
Hemoglobin: 11.3 g/dL — ABNORMAL LOW (ref 12.0–15.0)
Hemoglobin: 11.4 g/dL — ABNORMAL LOW (ref 12.0–15.0)
Hemoglobin: 11.8 g/dL — ABNORMAL LOW (ref 12.0–15.0)

## 2010-04-04 LAB — RENAL FUNCTION PANEL
Albumin: 3.1 g/dL — ABNORMAL LOW (ref 3.5–5.2)
BUN: 52 mg/dL — ABNORMAL HIGH (ref 6–23)
Calcium: 9.3 mg/dL (ref 8.4–10.5)
Glucose, Bld: 130 mg/dL — ABNORMAL HIGH (ref 70–99)
Phosphorus: 4.1 mg/dL (ref 2.3–4.6)
Potassium: 4.3 mEq/L (ref 3.5–5.1)

## 2010-04-04 LAB — POCT HEMOGLOBIN-HEMACUE
Hemoglobin: 11.7 g/dL — ABNORMAL LOW (ref 12.0–15.0)
Hemoglobin: 11.4 g/dL — ABNORMAL LOW (ref 12.0–15.0)
Hemoglobin: 11.9 g/dL — ABNORMAL LOW (ref 12.0–15.0)

## 2010-04-04 LAB — FERRITIN: Ferritin: 769 ng/mL — ABNORMAL HIGH (ref 10–291)

## 2010-04-04 LAB — IRON AND TIBC
Iron: 104 ug/dL (ref 42–135)
Saturation Ratios: 55 % (ref 20–55)
Saturation Ratios: 23 % (ref 20–55)
UIBC: 84 ug/dL
UIBC: 148 ug/dL

## 2010-04-04 LAB — PTH, INTACT AND CALCIUM: Calcium, Total (PTH): 9.1 mg/dL (ref 8.4–10.5)

## 2010-04-05 LAB — POCT HEMOGLOBIN-HEMACUE
Hemoglobin: 11.4 g/dL — ABNORMAL LOW (ref 12.0–15.0)
Hemoglobin: 11.9 g/dL — ABNORMAL LOW (ref 12.0–15.0)

## 2010-04-05 LAB — FERRITIN: Ferritin: 783 ng/mL — ABNORMAL HIGH (ref 10–291)

## 2010-04-05 LAB — IRON AND TIBC
Saturation Ratios: 30 % (ref 20–55)
UIBC: 136 ug/dL

## 2010-04-06 LAB — POCT HEMOGLOBIN-HEMACUE: Hemoglobin: 12.3 g/dL (ref 12.0–15.0)

## 2010-04-07 LAB — POCT HEMOGLOBIN-HEMACUE
Hemoglobin: 10.8 g/dL — ABNORMAL LOW (ref 12.0–15.0)
Hemoglobin: 11 g/dL — ABNORMAL LOW (ref 12.0–15.0)
Hemoglobin: 11.5 g/dL — ABNORMAL LOW (ref 12.0–15.0)

## 2010-04-07 LAB — IRON AND TIBC
Iron: 43 ug/dL (ref 42–135)
Saturation Ratios: 39 % (ref 20–55)
TIBC: 187 ug/dL — ABNORMAL LOW (ref 250–470)
UIBC: 132 ug/dL

## 2010-04-07 LAB — PTH, INTACT AND CALCIUM: Calcium, Total (PTH): 8.6 mg/dL (ref 8.4–10.5)

## 2010-04-08 ENCOUNTER — Encounter (HOSPITAL_COMMUNITY): Payer: No Typology Code available for payment source

## 2010-04-08 ENCOUNTER — Other Ambulatory Visit: Payer: Self-pay

## 2010-04-08 LAB — POCT HEMOGLOBIN-HEMACUE
Hemoglobin: 10.7 g/dL — ABNORMAL LOW (ref 12.0–15.0)
Hemoglobin: 10.7 g/dL — ABNORMAL LOW (ref 12.0–15.0)
Hemoglobin: 11.1 g/dL — ABNORMAL LOW (ref 12.0–15.0)
Hemoglobin: 7.9 g/dL — ABNORMAL LOW (ref 12.0–15.0)

## 2010-04-08 LAB — FERRITIN: Ferritin: 534 ng/mL — ABNORMAL HIGH (ref 10–291)

## 2010-04-08 LAB — IRON AND TIBC: UIBC: 151 ug/dL

## 2010-04-09 LAB — POCT HEMOGLOBIN-HEMACUE
Hemoglobin: 10 g/dL — ABNORMAL LOW (ref 12.0–15.0)
Hemoglobin: 10.8 g/dL — ABNORMAL LOW (ref 12.0–15.0)

## 2010-04-10 LAB — POCT HEMOGLOBIN-HEMACUE
Hemoglobin: 11.2 g/dL — ABNORMAL LOW (ref 12.0–15.0)
Hemoglobin: 11.4 g/dL — ABNORMAL LOW (ref 12.0–15.0)

## 2010-04-12 ENCOUNTER — Inpatient Hospital Stay (HOSPITAL_COMMUNITY)
Admission: EM | Admit: 2010-04-12 | Discharge: 2010-05-06 | DRG: 982 | Disposition: A | Discharge status: Home Health | Payer: No Typology Code available for payment source | Attending: Cardiovascular Disease | Admitting: Cardiovascular Disease

## 2010-04-12 ENCOUNTER — Emergency Department (HOSPITAL_COMMUNITY): Payer: No Typology Code available for payment source

## 2010-04-12 DIAGNOSIS — J449 Chronic obstructive pulmonary disease, unspecified: Secondary | ICD-10-CM | POA: Diagnosis present

## 2010-04-12 DIAGNOSIS — J4489 Other specified chronic obstructive pulmonary disease: Secondary | ICD-10-CM | POA: Diagnosis present

## 2010-04-12 DIAGNOSIS — M109 Gout, unspecified: Secondary | ICD-10-CM | POA: Diagnosis not present

## 2010-04-12 DIAGNOSIS — E870 Hyperosmolality and hypernatremia: Secondary | ICD-10-CM | POA: Diagnosis not present

## 2010-04-12 DIAGNOSIS — K259 Gastric ulcer, unspecified as acute or chronic, without hemorrhage or perforation: Secondary | ICD-10-CM | POA: Diagnosis present

## 2010-04-12 DIAGNOSIS — T465X5A Adverse effect of other antihypertensive drugs, initial encounter: Secondary | ICD-10-CM | POA: Diagnosis not present

## 2010-04-12 DIAGNOSIS — E785 Hyperlipidemia, unspecified: Secondary | ICD-10-CM | POA: Diagnosis present

## 2010-04-12 DIAGNOSIS — E1142 Type 2 diabetes mellitus with diabetic polyneuropathy: Secondary | ICD-10-CM | POA: Diagnosis present

## 2010-04-12 DIAGNOSIS — L97509 Non-pressure chronic ulcer of other part of unspecified foot with unspecified severity: Secondary | ICD-10-CM | POA: Diagnosis present

## 2010-04-12 DIAGNOSIS — D631 Anemia in chronic kidney disease: Secondary | ICD-10-CM | POA: Diagnosis present

## 2010-04-12 DIAGNOSIS — K660 Peritoneal adhesions (postprocedural) (postinfection): Secondary | ICD-10-CM | POA: Diagnosis present

## 2010-04-12 DIAGNOSIS — I252 Old myocardial infarction: Secondary | ICD-10-CM

## 2010-04-12 DIAGNOSIS — E876 Hypokalemia: Secondary | ICD-10-CM | POA: Diagnosis not present

## 2010-04-12 DIAGNOSIS — Y834 Other reconstructive surgery as the cause of abnormal reaction of the patient, or of later complication, without mention of misadventure at the time of the procedure: Secondary | ICD-10-CM | POA: Diagnosis not present

## 2010-04-12 DIAGNOSIS — L03119 Cellulitis of unspecified part of limb: Secondary | ICD-10-CM | POA: Diagnosis present

## 2010-04-12 DIAGNOSIS — Y9210 Unspecified residential institution as the place of occurrence of the external cause: Secondary | ICD-10-CM | POA: Diagnosis present

## 2010-04-12 DIAGNOSIS — T380X5A Adverse effect of glucocorticoids and synthetic analogues, initial encounter: Secondary | ICD-10-CM | POA: Diagnosis not present

## 2010-04-12 DIAGNOSIS — E46 Unspecified protein-calorie malnutrition: Secondary | ICD-10-CM | POA: Diagnosis not present

## 2010-04-12 DIAGNOSIS — K560 Paralytic ileus: Secondary | ICD-10-CM | POA: Diagnosis not present

## 2010-04-12 DIAGNOSIS — N289 Disorder of kidney and ureter, unspecified: Secondary | ICD-10-CM | POA: Diagnosis not present

## 2010-04-12 DIAGNOSIS — I129 Hypertensive chronic kidney disease with stage 1 through stage 4 chronic kidney disease, or unspecified chronic kidney disease: Secondary | ICD-10-CM | POA: Diagnosis present

## 2010-04-12 DIAGNOSIS — F341 Dysthymic disorder: Secondary | ICD-10-CM | POA: Diagnosis present

## 2010-04-12 DIAGNOSIS — Z6841 Body Mass Index (BMI) 40.0 and over, adult: Secondary | ICD-10-CM

## 2010-04-12 DIAGNOSIS — I2510 Atherosclerotic heart disease of native coronary artery without angina pectoris: Secondary | ICD-10-CM | POA: Diagnosis present

## 2010-04-12 DIAGNOSIS — K929 Disease of digestive system, unspecified: Secondary | ICD-10-CM | POA: Diagnosis not present

## 2010-04-12 DIAGNOSIS — D72829 Elevated white blood cell count, unspecified: Secondary | ICD-10-CM | POA: Diagnosis not present

## 2010-04-12 DIAGNOSIS — I509 Heart failure, unspecified: Secondary | ICD-10-CM | POA: Diagnosis present

## 2010-04-12 DIAGNOSIS — Z22322 Carrier or suspected carrier of Methicillin resistant Staphylococcus aureus: Secondary | ICD-10-CM

## 2010-04-12 DIAGNOSIS — D62 Acute posthemorrhagic anemia: Secondary | ICD-10-CM | POA: Diagnosis not present

## 2010-04-12 DIAGNOSIS — Z794 Long term (current) use of insulin: Secondary | ICD-10-CM

## 2010-04-12 DIAGNOSIS — R000 Tachycardia, unspecified: Secondary | ICD-10-CM | POA: Diagnosis not present

## 2010-04-12 DIAGNOSIS — K436 Other and unspecified ventral hernia with obstruction, without gangrene: Secondary | ICD-10-CM | POA: Diagnosis present

## 2010-04-12 DIAGNOSIS — Z79899 Other long term (current) drug therapy: Secondary | ICD-10-CM

## 2010-04-12 DIAGNOSIS — E1149 Type 2 diabetes mellitus with other diabetic neurological complication: Secondary | ICD-10-CM | POA: Diagnosis present

## 2010-04-12 DIAGNOSIS — I5033 Acute on chronic diastolic (congestive) heart failure: Principal | ICD-10-CM | POA: Diagnosis present

## 2010-04-12 DIAGNOSIS — N183 Chronic kidney disease, stage 3 unspecified: Secondary | ICD-10-CM | POA: Diagnosis present

## 2010-04-12 DIAGNOSIS — R5381 Other malaise: Secondary | ICD-10-CM | POA: Diagnosis present

## 2010-04-12 DIAGNOSIS — E1169 Type 2 diabetes mellitus with other specified complication: Secondary | ICD-10-CM | POA: Diagnosis not present

## 2010-04-12 DIAGNOSIS — G4733 Obstructive sleep apnea (adult) (pediatric): Secondary | ICD-10-CM | POA: Diagnosis present

## 2010-04-12 DIAGNOSIS — L02619 Cutaneous abscess of unspecified foot: Secondary | ICD-10-CM | POA: Diagnosis present

## 2010-04-12 DIAGNOSIS — Z7982 Long term (current) use of aspirin: Secondary | ICD-10-CM

## 2010-04-12 HISTORY — DX: Heart failure, unspecified: I50.9

## 2010-04-12 HISTORY — DX: Essential (primary) hypertension: I10

## 2010-04-12 LAB — IRON AND TIBC
Saturation Ratios: 19 % — ABNORMAL LOW (ref 20–55)
Saturation Ratios: 36 % (ref 20–55)
TIBC: 181 ug/dL — ABNORMAL LOW (ref 250–470)
UIBC: 110 ug/dL

## 2010-04-12 LAB — POCT CARDIAC MARKERS: Myoglobin, poc: 322 ng/mL (ref 12–200)

## 2010-04-12 LAB — POCT HEMOGLOBIN-HEMACUE: Hemoglobin: 10.8 g/dL — ABNORMAL LOW (ref 12.0–15.0)

## 2010-04-12 LAB — CBC
HCT: 27.1 % — ABNORMAL LOW (ref 36.0–46.0)
MCHC: 31.7 g/dL (ref 30.0–36.0)
Platelets: 296 10*3/uL (ref 150–400)
RDW: 15.3 % (ref 11.5–15.5)
WBC: 11.6 10*3/uL — ABNORMAL HIGH (ref 4.0–10.5)

## 2010-04-12 LAB — BRAIN NATRIURETIC PEPTIDE: Pro B Natriuretic peptide (BNP): 221 pg/mL — ABNORMAL HIGH (ref 0.0–100.0)

## 2010-04-12 LAB — COMPREHENSIVE METABOLIC PANEL
ALT: 12 U/L (ref 0–35)
BUN: 24 mg/dL — ABNORMAL HIGH (ref 6–23)
CO2: 23 mEq/L (ref 19–32)
Calcium: 8.8 mg/dL (ref 8.4–10.5)
GFR calc non Af Amer: 22 mL/min — ABNORMAL LOW (ref >60)
Glucose, Bld: 174 mg/dL — ABNORMAL HIGH (ref 70–99)
Sodium: 139 mEq/L (ref 135–145)

## 2010-04-12 LAB — DIFFERENTIAL
Basophils Relative: 1 % (ref 0–1)
Eosinophils Absolute: 0.1 10*3/uL (ref 0.0–0.7)
Eosinophils Relative: 1 % (ref 0–5)
Monocytes Relative: 11 % (ref 3–12)
Neutrophils Relative %: 70 % (ref 43–77)

## 2010-04-13 ENCOUNTER — Inpatient Hospital Stay (HOSPITAL_COMMUNITY): Payer: No Typology Code available for payment source

## 2010-04-13 LAB — GLUCOSE, CAPILLARY
Glucose-Capillary: 124 mg/dL — ABNORMAL HIGH (ref 70–99)
Glucose-Capillary: 159 mg/dL — ABNORMAL HIGH (ref 70–99)

## 2010-04-13 LAB — CARDIAC PANEL(CRET KIN+CKTOT+MB+TROPI)
Relative Index: INVALID (ref 0.0–2.5)
Total CK: 86 U/L (ref 7–177)
Total CK: 87 U/L (ref 7–177)

## 2010-04-13 LAB — TSH: TSH: 1.338 u[IU]/mL (ref 0.350–4.500)

## 2010-04-13 LAB — CK TOTAL AND CKMB (NOT AT ARMC)
CK, MB: 0.5 ng/mL (ref 0.3–4.0)
Total CK: 102 U/L (ref 7–177)

## 2010-04-13 LAB — LIPID PANEL: Triglycerides: 91 mg/dL (ref <150)

## 2010-04-13 LAB — TROPONIN I: Troponin I: 0.01 ng/mL (ref 0.00–0.06)

## 2010-04-13 NOTE — H&P (Signed)
[name], [name] NO.:  [id number]  MEDICAL RECORD NO.:  [id number]           PATIENT TYPE:  I  LOCATION:  [id number]                         FACILITY:  [facility]  PHYSICIAN:  Jana Hakim, M.D. DATE OF BIRTH:  03/04/1945  DATE OF ADMISSION:  04/12/2010 DATE OF DISCHARGE:                             HISTORY & PHYSICAL   PRIMARY CARE PHYSICIAN:  Royetta Crochet. Karlton Lemon, MD  CHIEF COMPLAINT:  Shortness of breath  HISTORY OF PRESENT ILLNESS:  This is a 65 year old female with multiple medical problems who presents to the emergency department with complaints of worsening shortness of breath for the past 2 days.  The patient also reports having chest discomfort, chest tightness, as well as generalized weakness and increased falling.  The patient reports falling the day before.  The patient reports having lightheadedness, but denies having any syncope.  She denies having any fevers or chills, but does report having some cough. The patient was seen and evaluated in the emergency department.  A chest x-ray was performed, which revealed bilateral pneumonia versus bilateral pulmonary edema.  She did have an elevated beta natriuretic peptide at 221.  The patient was covered for both an acute exacerbation of diastolic CHF as well as community-acquired pneumonia.  She was referred for medical admission.  PAST MEDICAL HISTORY:  Significant for hypertension, type 2 diabetes mellitus, degenerative joint disease, diastolic congestive heart failure syndrome, gout, stage III chronic kidney disease, and morbid obesity.  PAST SURGICAL HISTORY:  History of a hysterectomy and exploratory laparotomy.  SOCIAL HISTORY:  The patient is nonsmoker, nondrinker.  No history of illicit drug usage.  FAMILY HISTORY:  Noncontributory.  REVIEW OF SYSTEMS:  Pertinents as mentioned above.  PHYSICAL EXAMINATION FINDINGS:  GENERAL:  This is a morbidly obese 65- year-old [place] American female  who is in no visible discomfort or acute distress. VITAL SIGNS:  Temperature 98.8, blood pressure 161/73, heart rate 92, respirations 22, O2 sats 99-100%. HEENT:  Normocephalic, atraumatic.  Pupils equally round and reactive to light.  Extraocular movements are intact.  Funduscopic benign.  There is no scleral icterus.  Nares are patent bilaterally.  Oropharynx is clear. NECK:  Supple.  Full range of motion.  No thyromegaly, adenopathy, or jugular venous distention. CARDIOVASCULAR:  Regular rate and rhythm.  No murmurs, gallops, or rubs appreciated. LUNGS:  Clear to auscultation bilaterally.  No rales, rhonchi, or wheezes. ABDOMEN:  Positive bowel sounds, soft, nontender, nondistended.  No hepatosplenomegaly.  No rebound.  No guarding. EXTREMITIES:  Without cyanosis, clubbing or edema. NEUROLOGIC:  Generalized weakness, but no focal deficits.  LABORATORY STUDIES:  White blood cell count 11.6, hemoglobin 8.6, hematocrit 27.1, MCV 90.3, platelets 296, neutrophils 70%, lymphocytes 18%.  Sodium 139, potassium 3.8, chloride 108, carbon dioxide 23, BUN 24, creatinine 2.26, glucose 174, albumin 2.2, beta natriuretic peptide 221, AST 16, ALT 12, alkaline phosphatase 49, total bilirubin 0.3.  ASSESSMENT:  A 65 year old female being admitted with: 1. Acute exacerbation of congestive heart failure. 2. Shortness of breath. 3. Community-acquired pneumonia. 4. Chronic kidney disease stage III. 5. Hypoalbuminemia. 6. Anemia of chronic disease. 7. Morbid obesity.  PLAN:  The patient will be admitted to a telemetry area.  Cardiac enzymes will be performed, and the patient will be placed on nitro paste therapy.  IV diuretic therapy has been administered, 80 mg of Lasix IV x1, and she will continue on diuretic therapy q.12 h. if her blood pressure permits.  Potassium replacement therapy has also been ordered, but this will also be held if the Lasix not given.  The patient has been placed on  Avelox therapy to cover community-acquired pneumonia, and nebulizer treatments have also been ordered with supplemental oxygen. The patient's regular medications will be further verified, and sliding scale insulin coverage has been ordered.  The patient is a full code. DVT prophylaxis has been ordered as well.     Jana Hakim, M.D.     HJ/MEDQ  D:  04/13/2010  T:  04/13/2010  Job:  [id number]  cc:   Royetta Crochet. Karlton Lemon, M.D.  Electronically Signed by Jana Hakim M.D. on 04/13/2010 11:38:15 PM

## 2010-04-14 ENCOUNTER — Inpatient Hospital Stay (HOSPITAL_COMMUNITY): Payer: No Typology Code available for payment source

## 2010-04-14 LAB — CBC
MCH: 28.4 pg (ref 26.0–34.0)
MCHC: 31.4 g/dL (ref 30.0–36.0)
MCV: 90.4 fL (ref 78.0–100.0)
Platelets: 282 10*3/uL (ref 150–400)
RDW: 15.5 % (ref 11.5–15.5)

## 2010-04-14 LAB — BASIC METABOLIC PANEL
BUN: 26 mg/dL — ABNORMAL HIGH (ref 6–23)
Creatinine, Ser: 2.35 mg/dL — ABNORMAL HIGH (ref 0.4–1.2)
GFR calc non Af Amer: 21 mL/min — ABNORMAL LOW (ref >60)

## 2010-04-14 LAB — GLUCOSE, CAPILLARY: Glucose-Capillary: 62 mg/dL — ABNORMAL LOW (ref 70–99)

## 2010-04-15 ENCOUNTER — Inpatient Hospital Stay (HOSPITAL_COMMUNITY): Payer: Medicare (Managed Care)

## 2010-04-15 ENCOUNTER — Inpatient Hospital Stay (HOSPITAL_COMMUNITY): Payer: No Typology Code available for payment source

## 2010-04-15 LAB — GLUCOSE, CAPILLARY
Glucose-Capillary: 122 mg/dL — ABNORMAL HIGH (ref 70–99)
Glucose-Capillary: 148 mg/dL — ABNORMAL HIGH (ref 70–99)
Glucose-Capillary: 69 mg/dL — ABNORMAL LOW (ref 70–99)

## 2010-04-15 LAB — RENAL FUNCTION PANEL
Albumin: 2.7 g/dL — ABNORMAL LOW (ref 3.5–5.2)
BUN: 43 mg/dL — ABNORMAL HIGH (ref 6–23)
Chloride: 111 mEq/L (ref 96–112)
Creatinine, Ser: 1.98 mg/dL — ABNORMAL HIGH (ref 0.4–1.2)
Glucose, Bld: 128 mg/dL — ABNORMAL HIGH (ref 70–99)
Potassium: 4.9 mEq/L (ref 3.5–5.1)

## 2010-04-15 LAB — BASIC METABOLIC PANEL
BUN: 24 mg/dL — ABNORMAL HIGH (ref 6–23)
CO2: 24 mEq/L (ref 19–32)
Calcium: 8.3 mg/dL — ABNORMAL LOW (ref 8.4–10.5)
Creatinine, Ser: 2.21 mg/dL — ABNORMAL HIGH (ref 0.4–1.2)
GFR calc Af Amer: 27 mL/min — ABNORMAL LOW (ref >60)

## 2010-04-15 LAB — POCT HEMOGLOBIN-HEMACUE
Hemoglobin: 10.6 g/dL — ABNORMAL LOW (ref 12.0–15.0)
Hemoglobin: 11.9 g/dL — ABNORMAL LOW (ref 12.0–15.0)

## 2010-04-15 LAB — CENTROMERE ANTIBODIES: Centromere Ab Screen: <1 AU/mL (ref <30)

## 2010-04-15 LAB — CBC
MCHC: 31 g/dL (ref 30.0–36.0)
Platelets: 301 10*3/uL (ref 150–400)
RDW: 15.5 % (ref 11.5–15.5)
WBC: 15.1 10*3/uL — ABNORMAL HIGH (ref 4.0–10.5)

## 2010-04-15 LAB — ANA: Anti Nuclear Antibody(ANA): NEGATIVE

## 2010-04-15 LAB — PTH, INTACT AND CALCIUM
Calcium, Total (PTH): 8.4 mg/dL (ref 8.4–10.5)
PTH: 273.7 pg/mL — ABNORMAL HIGH (ref 14.0–72.0)

## 2010-04-15 MED ORDER — TECHNETIUM TO 99M ALBUMIN AGGREGATED
6.0000 | Freq: Once | INTRAVENOUS | Status: AC | PRN
  Administered 2010-04-15: 6 via INTRAVENOUS

## 2010-04-15 MED ORDER — XENON XE 133 GAS
10.0000 | GAS_FOR_INHALATION | Freq: Once | RESPIRATORY_TRACT | Status: AC | PRN
  Administered 2010-04-15: 10 via RESPIRATORY_TRACT

## 2010-04-16 LAB — GLUCOSE, CAPILLARY
Glucose-Capillary: 127 mg/dL — ABNORMAL HIGH (ref 70–99)
Glucose-Capillary: 135 mg/dL — ABNORMAL HIGH (ref 70–99)
Glucose-Capillary: 52 mg/dL — ABNORMAL LOW (ref 70–99)
Glucose-Capillary: 60 mg/dL — ABNORMAL LOW (ref 70–99)
Glucose-Capillary: 62 mg/dL — ABNORMAL LOW (ref 70–99)
Glucose-Capillary: 84 mg/dL (ref 70–99)
Glucose-Capillary: 91 mg/dL (ref 70–99)

## 2010-04-17 ENCOUNTER — Inpatient Hospital Stay (HOSPITAL_COMMUNITY): Payer: No Typology Code available for payment source

## 2010-04-17 LAB — COMPREHENSIVE METABOLIC PANEL
ALT: 12 U/L (ref 0–35)
AST: 14 U/L (ref 0–37)
CO2: 24 mEq/L (ref 19–32)
Chloride: 111 mEq/L (ref 96–112)
GFR calc Af Amer: 21 mL/min — ABNORMAL LOW (ref >60)
GFR calc non Af Amer: 18 mL/min — ABNORMAL LOW (ref >60)
Potassium: 4.5 mEq/L (ref 3.5–5.1)
Sodium: 139 mEq/L (ref 135–145)
Total Bilirubin: 0.2 mg/dL — ABNORMAL LOW (ref 0.3–1.2)

## 2010-04-17 LAB — CBC
HCT: 25.6 % — ABNORMAL LOW (ref 36.0–46.0)
MCHC: 31.3 g/dL (ref 30.0–36.0)
Platelets: 292 10*3/uL (ref 150–400)
RDW: 15.2 % (ref 11.5–15.5)
WBC: 13 10*3/uL — ABNORMAL HIGH (ref 4.0–10.5)

## 2010-04-17 LAB — GLUCOSE, CAPILLARY: Glucose-Capillary: 108 mg/dL — ABNORMAL HIGH (ref 70–99)

## 2010-04-18 ENCOUNTER — Inpatient Hospital Stay (HOSPITAL_COMMUNITY): Payer: No Typology Code available for payment source

## 2010-04-18 ENCOUNTER — Encounter (HOSPITAL_COMMUNITY): Payer: Self-pay | Admitting: Radiology

## 2010-04-18 LAB — CBC
MCHC: 31.4 g/dL (ref 30.0–36.0)
MCV: 89.5 fL (ref 78.0–100.0)
Platelets: 329 10*3/uL (ref 150–400)
RDW: 14.9 % (ref 11.5–15.5)
WBC: 13.2 10*3/uL — ABNORMAL HIGH (ref 4.0–10.5)

## 2010-04-18 LAB — BASIC METABOLIC PANEL
BUN: 34 mg/dL — ABNORMAL HIGH (ref 6–23)
Calcium: 9.2 mg/dL (ref 8.4–10.5)
GFR calc non Af Amer: 17 mL/min — ABNORMAL LOW (ref >60)
Potassium: 4.8 mEq/L (ref 3.5–5.1)

## 2010-04-18 LAB — AMYLASE: Amylase: 60 U/L (ref 0–105)

## 2010-04-18 LAB — GLUCOSE, CAPILLARY
Glucose-Capillary: 165 mg/dL — ABNORMAL HIGH (ref 70–99)
Glucose-Capillary: 221 mg/dL — ABNORMAL HIGH (ref 70–99)

## 2010-04-18 LAB — PHOSPHORUS: Phosphorus: 4.5 mg/dL (ref 2.3–4.6)

## 2010-04-18 LAB — MAGNESIUM: Magnesium: 2 mg/dL (ref 1.5–2.5)

## 2010-04-18 MED ORDER — TECHNETIUM TC 99M TETROFOSMIN IV KIT
30.0000 | PACK | Freq: Once | INTRAVENOUS | Status: AC | PRN
  Administered 2010-04-17: 30 via INTRAVENOUS

## 2010-04-18 MED ORDER — TECHNETIUM TC 99M TETROFOSMIN IV KIT
30.0000 | PACK | Freq: Once | INTRAVENOUS | Status: AC | PRN
  Administered 2010-04-18: 30 via INTRAVENOUS

## 2010-04-19 LAB — GLUCOSE, CAPILLARY
Glucose-Capillary: 203 mg/dL — ABNORMAL HIGH (ref 70–99)
Glucose-Capillary: 127 mg/dL — ABNORMAL HIGH (ref 70–99)

## 2010-04-19 LAB — CBC
MCV: 88.8 fL (ref 78.0–100.0)
Platelets: 349 10*3/uL (ref 150–400)
RBC: 2.78 MIL/uL — ABNORMAL LOW (ref 3.87–5.11)
RDW: 14.8 % (ref 11.5–15.5)
WBC: 15.3 10*3/uL — ABNORMAL HIGH (ref 4.0–10.5)

## 2010-04-19 LAB — BASIC METABOLIC PANEL
Chloride: 104 mEq/L (ref 96–112)
GFR calc Af Amer: 20 mL/min — ABNORMAL LOW (ref >60)
GFR calc non Af Amer: 17 mL/min — ABNORMAL LOW (ref >60)
Potassium: 4.4 mEq/L (ref 3.5–5.1)
Sodium: 138 mEq/L (ref 135–145)

## 2010-04-20 ENCOUNTER — Inpatient Hospital Stay (HOSPITAL_COMMUNITY): Payer: No Typology Code available for payment source

## 2010-04-20 LAB — BASIC METABOLIC PANEL
BUN: 50 mg/dL — ABNORMAL HIGH (ref 6–23)
Creatinine, Ser: 2.87 mg/dL — ABNORMAL HIGH (ref 0.4–1.2)
GFR calc non Af Amer: 16 mL/min — ABNORMAL LOW (ref >60)
Potassium: 3.6 mEq/L (ref 3.5–5.1)

## 2010-04-20 LAB — URINE CULTURE: Culture: NO GROWTH

## 2010-04-20 LAB — GLUCOSE, CAPILLARY
Glucose-Capillary: 131 mg/dL — ABNORMAL HIGH (ref 70–99)
Glucose-Capillary: 181 mg/dL — ABNORMAL HIGH (ref 70–99)
Glucose-Capillary: 169 mg/dL — ABNORMAL HIGH (ref 70–99)

## 2010-04-21 ENCOUNTER — Inpatient Hospital Stay (HOSPITAL_COMMUNITY): Payer: Medicare (Managed Care)

## 2010-04-21 LAB — CBC
HCT: 27.9 % — ABNORMAL LOW (ref 36.0–46.0)
MCH: 27.3 pg (ref 26.0–34.0)
MCHC: 31.2 g/dL (ref 30.0–36.0)
MCV: 87.5 fL (ref 78.0–100.0)
RDW: 14.7 % (ref 11.5–15.5)

## 2010-04-21 LAB — BASIC METABOLIC PANEL
BUN: 50 mg/dL — ABNORMAL HIGH (ref 6–23)
Calcium: 9.1 mg/dL (ref 8.4–10.5)
GFR calc non Af Amer: 19 mL/min — ABNORMAL LOW (ref >60)
Glucose, Bld: 99 mg/dL (ref 70–99)

## 2010-04-21 LAB — GLUCOSE, CAPILLARY: Glucose-Capillary: 111 mg/dL — ABNORMAL HIGH (ref 70–99)

## 2010-04-22 ENCOUNTER — Encounter (HOSPITAL_COMMUNITY): Payer: Medicare Other

## 2010-04-22 LAB — GLUCOSE, CAPILLARY
Glucose-Capillary: 178 mg/dL — ABNORMAL HIGH (ref 70–99)
Glucose-Capillary: 111 mg/dL — ABNORMAL HIGH (ref 70–99)

## 2010-04-22 LAB — POCT HEMOGLOBIN-HEMACUE: Hemoglobin: 11.7 g/dL — ABNORMAL LOW (ref 12.0–15.0)

## 2010-04-23 LAB — BASIC METABOLIC PANEL
BUN: 45 mg/dL — ABNORMAL HIGH (ref 6–23)
CO2: 26 mEq/L (ref 19–32)
Chloride: 104 mEq/L (ref 96–112)
Creatinine, Ser: 2.34 mg/dL — ABNORMAL HIGH (ref 0.4–1.2)
Glucose, Bld: 113 mg/dL — ABNORMAL HIGH (ref 70–99)

## 2010-04-23 LAB — CBC
MCHC: 31.4 g/dL (ref 30.0–36.0)
MCV: 89 fL (ref 78.0–100.0)
Platelets: 353 10*3/uL (ref 150–400)
RDW: 15 % (ref 11.5–15.5)
WBC: 14.9 10*3/uL — ABNORMAL HIGH (ref 4.0–10.5)

## 2010-04-23 LAB — PTH, INTACT AND CALCIUM
Calcium, Total (PTH): 8.8 mg/dL (ref 8.4–10.5)
PTH: 302.7 pg/mL — ABNORMAL HIGH (ref 14.0–72.0)

## 2010-04-23 LAB — POCT HEMOGLOBIN-HEMACUE: Hemoglobin: 10.6 g/dL — ABNORMAL LOW (ref 12.0–15.0)

## 2010-04-23 LAB — IRON AND TIBC: Saturation Ratios: 48 % (ref 20–55)

## 2010-04-23 LAB — RENAL FUNCTION PANEL
Albumin: 2.9 g/dL — ABNORMAL LOW (ref 3.5–5.2)
BUN: 75 mg/dL — ABNORMAL HIGH (ref 6–23)
GFR calc Af Amer: 22 mL/min — ABNORMAL LOW (ref >60)
GFR calc non Af Amer: 18 mL/min — ABNORMAL LOW (ref >60)
Phosphorus: 5 mg/dL — ABNORMAL HIGH (ref 2.3–4.6)
Potassium: 4.3 mEq/L (ref 3.5–5.1)
Sodium: 140 mEq/L (ref 135–145)

## 2010-04-23 LAB — GLUCOSE, CAPILLARY: Glucose-Capillary: 109 mg/dL — ABNORMAL HIGH (ref 70–99)

## 2010-04-23 LAB — APTT: aPTT: 40 seconds — ABNORMAL HIGH (ref 24–37)

## 2010-04-23 NOTE — Consult Note (Signed)
[name], BUTTER NO.:  [id number]  MEDICAL RECORD NO.:  [id number]           PATIENT TYPE:  I  LOCATION:  [id number]                         FACILITY:  [facility]  PHYSICIAN:  Coralie Keens, M.D. DATE OF BIRTH:  04-26-1945  DATE OF CONSULTATION:  04/18/2010 DATE OF DISCHARGE:                                CONSULTATION   REFERRING PHYSICIAN:  [facility].  REASON FOR CONSULTATION:  Ventral hernia.  SUMMARY OF HISTORY:  This is a 65 year old morbidly obese female, who was admitted on April 13, 2010 with shortness of breath.  During this hospitalization, she has developed abdominal pain.  She has been unable to move her bowels since admission and secondary to this, a CAT scan of the abdomen and pelvis was performed demonstrating a ventral hernia, therefore surgery was consulted.  The patient reports she has pain at the site of the hernia.  She has had hernia for some time and has been pain free from it until recently.  She currently denies nausea or vomiting.  Again, she has had no flatus or bowel movement by her report for 6 days.  Otherwise, she is currently without complaints.  Her shortness of breath has improved.  PAST MEDICAL HISTORY:  Significant for: 1. Hypertension. 2. Type 2 diabetes. 3. Degenerative joint disease. 4. Diastolic congestive heart failure. 5. Gout. 6. Chronic renal failure, 7. Morbid obesity.  PAST SURGICAL HISTORY:  Includes: 1. Hysterectomy. 2. Multiple procedures for ventral hernias.  Her last ventral hernia     repair was with mesh in 2008 by Dr. Kathrin Penner.  MEDICATIONS:  Please see universal medical reconciliation form.  ALLERGIES:  No known drug allergies.  SOCIAL HISTORY:  He does not smoke, does not drink alcohol.  FAMILY HISTORY:  Noncontributory.  REVIEW OF SYSTEMS:  GENERAL:  Negative for fever or chills.  PULMONARY: Positive for history of difficulty breathing and shortness of breath. CARDIAC:   Positive for history of congestive heart failure and hypertension.  ENDOCRINE:  Positive for history of diabetes.  ABDOMEN: Currently positive for constipation.  Negative for nausea, vomiting, or diarrhea.  URINARY:  Negative for dysuria or hematuria.  It is positive for chronic renal insufficiency.  The rest of the review of systems skin, eyes, ears, nose, and throat, musculoskeletal, neurologic, psychiatric, endocrine are normal except for anxiety and depression.  PHYSICAL EXAMINATION:  GENERAL:  This is a morbidly obese female sitting in a chair, in no acute distress. VITAL SIGNS:  Temperature 99, pulse 92, respiratory rate 20, blood pressure is 161/89.  HEENT:  Eyes:  Anicteric.  Pupils reactive bilaterally.  ENT:  External ears and nose are normal.  Hearing is normal.  Oropharynx is clear. NECK:  Supple.  Trachea is midline.  There is no thyromegaly. LUNGS:  Clear to auscultation bilaterally with normal respiratory effort.  CARDIOVASCULAR:  Regular rate and rhythm.  There are no murmurs.  There is no peripheral edema. ABDOMEN:  Morbidly obese.  There is an incarcerated ventral hernia just to the right of midline.  It is mildly tender.  The rest of the abdomen is soft and nontender. EXTREMITIES:  1+ peripheral edema.  She is morbidly obese.  Extremities have difficulty palpating the pulses in her feet.  She has good capillary refill in all four extremities. SKIN:  No rashes.  No jaundice. PSYCHIATRIC:  Awake, alert, and oriented.  Judgment and affect appear normal. NEUROLOGIC:  Normal motor and sensory function to all four extremities.  DATA REVIEWED:  The patient has a CAT scan of the abdomen and pelvis, which shows a ventral incisional hernia to the right side of previously placed mesh containing distal small bowel.  The bowel appears to be partially obstructed.  There is no bowel wall thickening and no pneumatosis.  There is no evidence of inflammatory process.  No  free fluid.  LABORATORY DATA:  Shows that the patient have a white blood count of 13.2, which is down.  Her creatinine is 2.81, potassium is 4.8, lipase and amylase are normal.  IMPRESSION:  This is a patient with multiple medical problems and a chronically incarcerated ventral incisional hernia.  At this point, she is not an optimum surgical candidate given her morbid obesity and multiple comorbidities.  This I believe is a chronically incarcerated hernia.  At this point, we will try to treat it conservatively with bowel rest and possible nasogastric decompression.  If she decompensates or if this does need repair, it would need to be done in the operating room under general anesthesia and I have explained this to her in detail including the risk of this.  We will follow her closely.     Coralie Keens, M.D.     DB/MEDQ  D:  04/18/2010  T:  04/19/2010  Job:  [id number]  Electronically Signed by Coralie Keens M.D. on 04/23/2010 10:44:34 AM

## 2010-04-24 ENCOUNTER — Inpatient Hospital Stay (HOSPITAL_COMMUNITY): Payer: No Typology Code available for payment source

## 2010-04-24 ENCOUNTER — Other Ambulatory Visit: Payer: Self-pay | Admitting: General Surgery

## 2010-04-24 LAB — GLUCOSE, CAPILLARY
Glucose-Capillary: 118 mg/dL — ABNORMAL HIGH (ref 70–99)
Glucose-Capillary: 170 mg/dL — ABNORMAL HIGH (ref 70–99)

## 2010-04-24 LAB — BASIC METABOLIC PANEL
CO2: 29 mEq/L (ref 19–32)
Chloride: 108 mEq/L (ref 96–112)
Creatinine, Ser: 2.18 mg/dL — ABNORMAL HIGH (ref 0.4–1.2)
GFR calc Af Amer: 27 mL/min — ABNORMAL LOW (ref >60)
Sodium: 144 mEq/L (ref 135–145)

## 2010-04-24 LAB — IRON AND TIBC: Iron: 38 ug/dL — ABNORMAL LOW (ref 42–135)

## 2010-04-24 LAB — CBC
Hemoglobin: 8.9 g/dL — ABNORMAL LOW (ref 12.0–15.0)
MCH: 27.7 pg (ref 26.0–34.0)
RBC: 3.21 MIL/uL — ABNORMAL LOW (ref 3.87–5.11)

## 2010-04-24 LAB — POCT HEMOGLOBIN-HEMACUE
Hemoglobin: 11.1 g/dL — ABNORMAL LOW (ref 12.0–15.0)
Hemoglobin: 12.1 g/dL (ref 12.0–15.0)
Hemoglobin: 11.3 g/dL — ABNORMAL LOW (ref 12.0–15.0)

## 2010-04-25 ENCOUNTER — Inpatient Hospital Stay (HOSPITAL_COMMUNITY): Payer: No Typology Code available for payment source

## 2010-04-25 LAB — POCT HEMOGLOBIN-HEMACUE
Hemoglobin: 9.8 g/dL — ABNORMAL LOW (ref 12.0–15.0)
Hemoglobin: 9.9 g/dL — ABNORMAL LOW (ref 12.0–15.0)

## 2010-04-25 LAB — BASIC METABOLIC PANEL
Chloride: 112 mEq/L (ref 96–112)
GFR calc Af Amer: 25 mL/min — ABNORMAL LOW (ref >60)
Potassium: 3.6 mEq/L (ref 3.5–5.1)

## 2010-04-25 LAB — GLUCOSE, CAPILLARY
Glucose-Capillary: 148 mg/dL — ABNORMAL HIGH (ref 70–99)
Glucose-Capillary: 142 mg/dL — ABNORMAL HIGH (ref 70–99)
Glucose-Capillary: 160 mg/dL — ABNORMAL HIGH (ref 70–99)

## 2010-04-25 LAB — CBC
Hemoglobin: 8.1 g/dL — ABNORMAL LOW (ref 12.0–15.0)
Platelets: 348 10*3/uL (ref 150–400)
RBC: 2.92 MIL/uL — ABNORMAL LOW (ref 3.87–5.11)
WBC: 17.4 10*3/uL — ABNORMAL HIGH (ref 4.0–10.5)

## 2010-04-25 LAB — FERRITIN: Ferritin: 602 ng/mL — ABNORMAL HIGH (ref 10–291)

## 2010-04-25 NOTE — Progress Notes (Signed)
[name], SWEETIN NO.:  [id number]  MEDICAL RECORD NO.:  [id number]           PATIENT TYPE:  I  LOCATION:  [id number]                         FACILITY:  [facility]  PHYSICIAN:  Julieta Bellini, MDDATE OF BIRTH:  1945-09-13                                PROGRESS NOTE   PRIMARY CARE PHYSICIAN: Royetta Crochet. Karlton Lemon, MD  She also has primary Nephrology to follow her as an outpatient, which is Dr. Erling Cruz.  DIAGNOSES: Include; 1. Diastolic congestive heart failure. 2. Hypertension. 3. Diabetes mellitus. 4. Gout arthropathy/flare. 5. Small bowel obstruction secondary to ventral hernia incarceration. 6. Chronic kidney disease, stage III with a baseline creatinine of 2.2     to 2.3. 7. Hyperlipidemia. 8. Obstructive sleep apnea. 9. Anemia of chronic disease. 10.Positive methicillin-resistant Staphylococcus aureus polymerase     chain reaction. 11.Leukocytosis. 12.Physical decondition.  CURRENT MEDICATIONS: Up to the moment of this dictation; 1. Albuterol 2.5 mg inhaled twice a day. 2. Allopurinol 100 mg by mouth daily. 3. Norvasc 10 mg by mouth daily. 4. Aspirin 325 mg by mouth daily. 5. Calcitriol 0.25 mcg by mouth daily. 6. Calcium carbonate 1 tablet by mouth daily. 7. Chlorhexidine in order to apply topically throughout her skin for     MRSA PCR decolonization protocol. 8. Clonidine 0.1 mg by mouth q.8 h. 9. Aranesp 200 mcg subcutaneously every 14 days. 10.Dextrose D5 half normal saline at 75 cc/hour. 11.Docusate sodium 100 mg by mouth twice a day. 12.Ferrous sulfate 975 mg by mouth daily. 13.Lasix 80 mg by mouth p.o. twice a day. 14.Heparin 5000 units subcutaneously once a day. 15.Hydralazine 50 mg p.o. t.i.d.  The patient has been on sliding     scale insulin and also using Novolin 10 units subcutaneously at     bedtime and 10 units subcutaneously with meals. 16.Imdur 120 mg by mouth daily. 17.Metoprolol 50 mg by mouth  daily. 18.Multivitamins 1 tablet by mouth daily. 19.Mupirocin applied to the skin inside her nostrils twice a day until     April 7 as part of decolonization for positive MRSA PCR. [id number] 1 topical application in her left great toe daily. 21.MiraLax 17 g by mouth daily. 22.The patient currently finishing a tapering prednisone dose for gout     arthropathy that she will be finishing on April 28, 2010, see     instructions on the patient's medication profile. 23.Crestor 20 mg by mouth daily. 24.Vancomycin 1 g IV. 25.Tylenol 650 mg q.4 h. p.r.n. for pain. 26.Albuterol 2.5 mg inhaled every 2 hours p.r.n. for shortness of     breath and wheezing. 27.Dulcolax 5 mg p.o. daily as needed for constipation. 28.Dilaudid 1-2 mg IV every 4 hours as needed for pain. 29.Maalox 30 mL by mouth every 6 hours as needed for obstipation and     gas. 30.Zofran 4 mg by mouth q.6 h. as needed for nausea. 31.Oxycodone/Roxicodone 10 mg by mouth q.4 h. as needed for pain.  HISTORY OF PRESENT ILLNESS: [name] is a 65 year old female with a multiple medical problems who presents to the emergency department with complaints of worsening shortness of breath for the  past 2 days prior to admission.  The patient reports having chest discomfort, chest tightness as well as generalized weakness and increased falling.  The patient reports falling the day before coming in to the hospital.  The patient reports having lightheadedness, but denies having any syncope.  She denies having any fever or chills, but does report having some cough.  The patient was seen and evaluated in the emergency department and a chest x-ray was performed revealing bilateral pneumonia versus bilateral pulmonary edema.  She did have an elevated BNP at 221 on admission.  The patient was covered for both an acute exacerbation of diastolic CHF as well as community-acquired pneumonia.  She was referred for medical admission.  LABORATORY  DATA: Pertinent laboratory data, up to the moment of this dictation demonstrated negative cardiac markers x3.  A lipid profile with total cholesterol of 118, triglycerides 91, cholesterol HDL 41, LDL 59, TSH 1.338, D-dimer elevated at 0.83.  ANA negative, anti-DNA 7.  Centromere antibodies less than 1.  Amylase 60, lipase 16.  Urine culture no growth.  The patient has throughout this whole hospitalization multiple CBC and also basic metabolic panel was in order to follow her kidney function and also her hemoglobin and leukocytosis.  I will be dictating prior problems, the pertinent ones.  PROCEDURE PERFORMED: Up to this dictation, the patient had a chest x-ray done on April 12, 2010 on admission that demonstrated bibasilar airspace opacity, consider bilateral pneumonia or edema.  A chest x-ray done on April 13, 2010, demonstrated cardiomegaly with a pulmonary vascular congestion.  No upper edema with improved aeration of the lung bases.  This was after the patient received Lasix.  On April 15, 2010, another x-ray for follow up was performed, demonstrated cardiomegaly and pulmonary vascular congestion.  The patient had nuclear medicine VQ scan to rule out pulmonary embolism that demonstrated very low probability for acute PE. The patient had a CT of abdomen and pelvis on April 18, 2010 following abdominal pain that demonstrated recurrent right paramedian ventral abdominal wall hernia on right side of surgical mesh, containing distal small bowel.  This hernia resulted in a partial distal small bowel obstruction.  There was no evidence of wall thickening or pneumatosis. No evidence of mass or inflammatory process.  The patient had Myoview on April 18, 2010 as well that demonstrated no evidence of myocardial ischemia attenuation involving the anterior apical wall and distal inferior wall with a normal left ventricle wall motion.  Estimated ejection fraction on Myoview was 57%.  The patient  had abdominal x-ray on April 20, 2010 that demonstrated no significant change in the small bowel obstruction and then she had another abdominal 2-view x-ray following her small bowel obstruction that demonstrated increasing small bowel distention compatible with ongoing obstruction with enteric tube remain in place.  No intra-abdominal free air.  HOSPITAL COURSE: 1. Shortness of breath, initially multifactorial secondary to     exacerbation of her diastolic congestive heart failure with     concerns for pneumonia and also obstructive sleep apnea, since she     was not actively using her CPAP at home.  The patient received     antibiotic therapy and also received diuresis with pretty much     return in her shortness of breath back to baseline.  At this point     regarding her diastolic congestive heart failure, the patient is     going to continue using Lasix 80 mg by mouth twice a day. 2.  Hypertension, which has not been pretty well controlled and in fact     is now driven by the patient's abdominal pain.  We are going to     continue using current regimen with plan to adjust her clonidine up     to 0.2 mg by mouth t.i.d. after the surgery that is pending for her     small bowel obstruction is done and her abdominal pain control in     order to have a better control of her blood pressure. 3. Diabetes.  Plan is to continue using insulin. 4. Gout arthroplasty.  The patient had a left arm, wrist and midpole     arm swelling secondary to gout that responds to the use of     prednisone and also the use of allopurinol.  At this point, the     flare is pretty much resolved.  The patient is in the process of     continued tapering dose of steroids. 5. Most active is the patient's small bowel obstruction, secondary to     ventral hernia surgeries and the plan is to have hernia repair     surgery on April 23, 2010. 6. Chronic kidney disease, stage III with a baseline of 2.3.  This     condition  is worsening during active IV diuresis, but now is back     to her baseline and plan is to continue monitor rising and     following as an outpatient with Dr. Florene Glen. 7. Hyperlipidemia.  Plan is to continue statins. 8. Positive MRSA PCR.  For the patient's positive MRSA PCR, the plan     is to provide decolonization with the treatment of chlorhexidine     and mupirocin.  The patient had been placed on contact precaution. 9. Leukocytosis, initially secondary to use of steroids for her gout     arthropathy, not treated by the patient small bowel obstruction.     Plan is to provide resolution of this problem and follow CBC. 10.Obstructive sleep apnea.  Plan is to continue using CPAP. 11.Anemia of chronic disease, which have been stable.  Plan is to     continue using Aranesp. 12.Hypokalemia, secondary to diuresis with a potassium of 3.0 today     while having this dictation done.  Plan is to replete potassium and     follow along.  She might require a maintenance daily dose. 13.Physical decondition.  PT and OT have assessed the patient and     recommend for her to go to a short-term SNF at the moment of     discharge.  PHYSICAL EXAMINATION: VITAL SIGNS:  Temperature 98.9, heart rate 93, respiratory rate 20, blood pressure 165/88, oxygen saturation 97% on room air. GENERAL:  The patient was in no acute distress. RESPIRATORY:  Clear to auscultation bilaterally. HEART:  Regular rate and rhythm. ABDOMEN:  Tender to palpation.  No bowel sounds, ventral posing hernia unchanged. EXTREMITIES:  Pedal edema 1+ bilaterally.  Good pulses. NEUROLOGIC:  Nonfocal.  At the time of this dictation, the patient had BMET with sodium of 142, potassium 3.0 prior repletion, chloride 104, bicarb 26, BUN 45, creatinine 2.34, blood sugar 116.  The patient had a white blood cell of 14.9, hemoglobin 8.9, platelet 353, magnesium 1.8.  PT 14.7, INR 1.13.     Julieta Bellini, MD     CEM/MEDQ  D:   04/23/2010  T:  04/23/2010  Job:  [id number]  cc:   Joelene Millin  Earleen Newport, M.D. Fax: [place]. Florene Glen, M.D. [phone]  Electronically Signed by Barton Dubois MD on 04/25/2010 04:38:50 PM

## 2010-04-26 LAB — GLUCOSE, CAPILLARY
Glucose-Capillary: 142 mg/dL — ABNORMAL HIGH (ref 70–99)
Glucose-Capillary: 173 mg/dL — ABNORMAL HIGH (ref 70–99)

## 2010-04-26 LAB — CBC
Hemoglobin: 7.1 g/dL — ABNORMAL LOW (ref 12.0–15.0)
MCH: 28.4 pg (ref 26.0–34.0)
MCHC: 30.5 g/dL (ref 30.0–36.0)
Platelets: 275 10*3/uL (ref 150–400)

## 2010-04-26 LAB — BASIC METABOLIC PANEL
CO2: 29 mEq/L (ref 19–32)
Calcium: 7.6 mg/dL — ABNORMAL LOW (ref 8.4–10.5)
Creatinine, Ser: 2.4 mg/dL — ABNORMAL HIGH (ref 0.4–1.2)
GFR calc Af Amer: 24 mL/min — ABNORMAL LOW (ref >60)

## 2010-04-26 LAB — POCT HEMOGLOBIN-HEMACUE: Hemoglobin: 8.9 g/dL — ABNORMAL LOW (ref 12.0–15.0)

## 2010-04-26 LAB — PREPARE RBC (CROSSMATCH)

## 2010-04-26 NOTE — Discharge Summary (Signed)
[name], [name] NO.:  [id number]  MEDICAL RECORD NO.:  [id number]          PATIENT TYPE:  INP  LOCATION:                               FACILITY:  [facility]  PHYSICIAN:  Verneita Griffes, MD        DATE OF BIRTH:  Mar 08, 1945  DATE OF ADMISSION: DATE OF DISCHARGE:                              DISCHARGE SUMMARY   DIAGNOSES: 1. Likely gout. 2. Tachycardia, possibly secondary to clonidine withdrawal and     secondary to gout. 3. Chronic kidney disease, stage III followed by Dr. Florene Glen,     Nephrology. 4. Congestive heart failure, ejection fraction of 60-65%. 5. Coronary artery disease. 6. Elevated blood pressure 7. Hyperlipidemia. 8. Obstructive sleep apnea-severe 9. Diabetes mellitus. 10.Anemic of chronic disease. 11.The patient was admitted for shortness of breath likely secondary     to her congestive heart failure.  CURRENT MEDICATIONS: 1. Albuterol 2.5 b.i.d. 2. Norvasc 10 mg daily. 3. Aspirin 325 once daily. 4. Calcitriol 0.25 mg daily. 5. Calcium carbonate. 6. Vitamin D daily. 7. Ferrous sulfate 975 daily. 8. Lasix 160 mg q.a.m. and 80 mg q.p.m. IV. 9. Hydralazine 25 b.i.d. 10.NPH insulin 30 units a.m. and 45 q.p.m. 11.Isosorbide 30 mg daily 12.Crestor 20 mg q.p.m. 13.Colchicine 1.2 mg daily and then followed by 0.6 after 1 hour. 14.Neomycin once daily to foot.  PERTINENT RADIOLOGICAL FINDINGS:  Chest x-ray portable April 12, 2010, which showed bibasilar airspace opacities, considered bilateral pneumonia, edema.  X-ray April 15, 2010, showed cardiomegaly and pulmonary vascular congestion.  Nuclear pulmonary study done on April 15, 2010, showed very low probability for acute pulmonary embolism.  Discharge medications will be reconciled on discharge by dictating physician.  HOSPITAL COURSE:  Briefly, this is a pleasant 65 year old female who was admitted for shortness of breath.  She was noted to have BNP on admission of 221 and she also  complained of chest discomfort, tightness, weakness and increased falling.  She has a sore on her left toe, which prevents her from walking around.  This has been going on for the past 2- 3 weeks.  She was given initially antibiotics in the emergency room for leukocytosis to 11.6 as chest x-ray seemed indeterminate and subsequently was discontinued off this.  She had no white count.  1. Shortness of breath.  The patient was found to be more likely in     the CHF exacerbation.  Her weight has increased during     hospitalization possibly to hyperproteinemia and also her CHF,     although she does not have any shortness of breath at present and     pulmonary vascular congestion seems to remain on chest x-ray.  She     has gained weight and she has had a midline placed.  I have     reimplanted her IV Lasix today at 160 q.a.m. and 80 q.p.m., and     likely with diuresis this should resolve. 2. CHF.  She had an echocardiogram on April 14, 2010, showing left     ventricular function 123456, grade 1 diastolic dysfunction.  PA  peak pressure of 31 mmHg.  She is scheduled for a cardiac     catheterization under Dr. Doylene Canard in the a.m. of April 16, 2010. 3. Tachycardia.  The patient had tachycardia, which was thought     initially to be secondary to likely her pain.  I had withheld her     off clonidine initially as the propensity for worsening of CHF and     possible bradycardia and tachycardia rarely; however, the patient     was reviewed by Dr. Doylene Canard who recommended reimplanting clonidine     at half the home dosage, which we are compliant with.  This should     help resolve some of her tachycardic state. 4. Questionable rheumatological disease.  The patient was unclear as     to what rheumatological disease she had and was told that she has     lupus nephritis by one-person then subsequently told me during     hospitalization she may have rheumatoid disease.  I did anti-DNA     and ANA  and centromere antibodies, which were all negative.  It was     noted on prior admission that she had uric acid on April 11, 2010,     which was 8.9, this likely she has gout.  The patient has     significant swelling of her right upper extremity and has been     placed on colchicine 1.2 mg and 0.6 mg subsequently to that.  The     patient may discontinue taking this medication, as she is also on     p.r.n. pain medication, which can be tapered hopefully once her     pain resolved. 5. Diabetes mellitus.  CBGs were done and she was slightly     hypoglycemic.  I have down titrated her Novolin to her NPH insulin     to 10 units b.i.d., as I am not clear if she takes her insulin at     all at home.  The patient has not been eating as well either.  This     will likely have to be up titrated as hospitalization proceeds. 6. Hyperlipidemia.  The patient is at home on Lipitor 40 and was     transitioned to hospital to Crestor.  She had a lipid panel on     April 13, 2010 showing very well controlled LDL at 59, likely would     not need such stringent care. 7. Obstructive sleep apnea.  She likely has a Mallampati score of 3     and may need outpatient evaluation Pulmonary.  She has been placed     on BiPAP at minimal setting 10/5 and is tolerating CPAP well. 8. The patient has history of COPD and is on albuterol and if the     patient can wean O2, we will do so. 9. Disposition.  The patient likely will need to go to a skilled     nursing facility as she has not ambulated well.  She is having pain     in her toe and she lives alone.  She was reviewed on April 16, 2010, and was stable.  She still had slight elevation of temperature, although I would attribute this to gout as her cultures until date are negative.  Her blood cultures and urine cultures are negative and she has no anemia, only cardiomegaly and pulmonary edema.  Blood pressure of 169-199 over 96-109, heart rate varies between 1-100  and  sinus tach occasionally.  The patient likely will be discharged when hemodynamically stable.          ______________________________ Verneita Griffes, MD     JS/MEDQ  D:  04/16/2010  T:  04/16/2010  Job:  [id number]  Electronically Signed by Verneita Griffes MD on 04/26/2010 05:08:51 AM

## 2010-04-27 LAB — CROSSMATCH
ABO/RH(D): A POS
Antibody Screen: NEGATIVE

## 2010-04-27 LAB — CBC
HCT: 24.3 % — ABNORMAL LOW (ref 36.0–46.0)
HCT: 24.4 % — ABNORMAL LOW (ref 36.0–46.0)
Hemoglobin: 6.8 g/dL — CL (ref 12.0–15.0)
MCHC: 32.6 g/dL (ref 30.0–36.0)
MCHC: 33.4 g/dL (ref 30.0–36.0)
MCHC: 33.6 g/dL (ref 30.0–36.0)
Platelets: 236 10*3/uL (ref 150–400)
Platelets: 236 10*3/uL (ref 150–400)
Platelets: 264 10*3/uL (ref 150–400)
RBC: 2.31 MIL/uL — ABNORMAL LOW (ref 3.87–5.11)
RBC: 3 MIL/uL — ABNORMAL LOW (ref 3.87–5.11)
RDW: 15.6 % — ABNORMAL HIGH (ref 11.5–15.5)
RDW: 16.2 % — ABNORMAL HIGH (ref 11.5–15.5)
WBC: 8.7 10*3/uL (ref 4.0–10.5)

## 2010-04-27 LAB — GLUCOSE, CAPILLARY
Glucose-Capillary: 161 mg/dL — ABNORMAL HIGH (ref 70–99)
Glucose-Capillary: 139 mg/dL — ABNORMAL HIGH (ref 70–99)
Glucose-Capillary: 145 mg/dL — ABNORMAL HIGH (ref 70–99)
Glucose-Capillary: 148 mg/dL — ABNORMAL HIGH (ref 70–99)
Glucose-Capillary: 152 mg/dL — ABNORMAL HIGH (ref 70–99)
Glucose-Capillary: 163 mg/dL — ABNORMAL HIGH (ref 70–99)
Glucose-Capillary: 182 mg/dL — ABNORMAL HIGH (ref 70–99)
Glucose-Capillary: 76 mg/dL (ref 70–99)
Glucose-Capillary: 82 mg/dL (ref 70–99)
Glucose-Capillary: 98 mg/dL (ref 70–99)

## 2010-04-27 LAB — UIFE/LIGHT CHAINS/TP QN, 24-HR UR
Free Kappa Lt Chains,Ur: 12.1 mg/dL — ABNORMAL HIGH (ref 0.04–1.51)
Free Lambda Lt Chains,Ur: 5.54 mg/dL — ABNORMAL HIGH (ref 0.08–1.01)
Gamma Globulin, Urine: DETECTED — AB

## 2010-04-27 LAB — TYPE AND SCREEN: Unit division: 0

## 2010-04-27 LAB — HEMOGLOBIN AND HEMATOCRIT, BLOOD
HCT: 26.2 % — ABNORMAL LOW (ref 36.0–46.0)
HCT: 28.9 % — ABNORMAL LOW (ref 36.0–46.0)
HCT: 29.3 % — ABNORMAL LOW (ref 36.0–46.0)
HCT: 30 % — ABNORMAL LOW (ref 36.0–46.0)
HCT: 31 % — ABNORMAL LOW (ref 36.0–46.0)
Hemoglobin: 10.2 g/dL — ABNORMAL LOW (ref 12.0–15.0)
Hemoglobin: 9.4 g/dL — ABNORMAL LOW (ref 12.0–15.0)
Hemoglobin: 9.6 g/dL — ABNORMAL LOW (ref 12.0–15.0)
Hemoglobin: 9.7 g/dL — ABNORMAL LOW (ref 12.0–15.0)
Hemoglobin: 9.9 g/dL — ABNORMAL LOW (ref 12.0–15.0)

## 2010-04-27 LAB — DIFFERENTIAL
Basophils Absolute: 0 10*3/uL (ref 0.0–0.1)
Basophils Absolute: 0 10*3/uL (ref 0.0–0.1)
Basophils Absolute: 0.1 10*3/uL (ref 0.0–0.1)
Basophils Relative: 0 % (ref 0–1)
Basophils Relative: 1 % (ref 0–1)
Eosinophils Absolute: 0.2 10*3/uL (ref 0.0–0.7)
Eosinophils Relative: 2 % (ref 0–5)
Eosinophils Relative: 2 % (ref 0–5)
Eosinophils Relative: 3 % (ref 0–5)
Lymphocytes Relative: 24 % (ref 12–46)
Lymphocytes Relative: 25 % (ref 12–46)
Lymphocytes Relative: 27 % (ref 12–46)
Lymphs Abs: 2.2 10*3/uL (ref 0.7–4.0)
Monocytes Absolute: 0.8 10*3/uL (ref 0.1–1.0)
Monocytes Absolute: 0.9 10*3/uL (ref 0.1–1.0)

## 2010-04-27 LAB — BASIC METABOLIC PANEL
BUN: 19 mg/dL (ref 6–23)
BUN: 25 mg/dL — ABNORMAL HIGH (ref 6–23)
CO2: 26 mEq/L (ref 19–32)
CO2: 28 mEq/L (ref 19–32)
Calcium: 8.1 mg/dL — ABNORMAL LOW (ref 8.4–10.5)
Calcium: 8.1 mg/dL — ABNORMAL LOW (ref 8.4–10.5)
Calcium: 8.5 mg/dL (ref 8.4–10.5)
GFR calc Af Amer: 27 mL/min — ABNORMAL LOW (ref >60)
GFR calc non Af Amer: 18 mL/min — ABNORMAL LOW (ref >60)
GFR calc non Af Amer: 22 mL/min — ABNORMAL LOW (ref >60)
GFR calc non Af Amer: 23 mL/min — ABNORMAL LOW (ref >60)
GFR calc non Af Amer: 26 mL/min — ABNORMAL LOW (ref >60)
Glucose, Bld: 141 mg/dL — ABNORMAL HIGH (ref 70–99)
Glucose, Bld: 40 mg/dL — ABNORMAL LOW (ref 70–99)
Potassium: 3.8 mEq/L (ref 3.5–5.1)
Potassium: 4.2 mEq/L (ref 3.5–5.1)
Potassium: 4.2 mEq/L (ref 3.5–5.1)
Sodium: 140 mEq/L (ref 135–145)
Sodium: 144 mEq/L (ref 135–145)
Sodium: 145 mEq/L (ref 135–145)

## 2010-04-27 LAB — URINE CULTURE
Culture: NO GROWTH
Special Requests: NEGATIVE

## 2010-04-27 LAB — RENAL FUNCTION PANEL
CO2: 27 mEq/L (ref 19–32)
Chloride: 103 mEq/L (ref 96–112)
Creatinine, Ser: 2.36 mg/dL — ABNORMAL HIGH (ref 0.4–1.2)
GFR calc Af Amer: 25 mL/min — ABNORMAL LOW (ref >60)
GFR calc non Af Amer: 21 mL/min — ABNORMAL LOW (ref >60)
Glucose, Bld: 145 mg/dL — ABNORMAL HIGH (ref 70–99)

## 2010-04-27 LAB — COMPREHENSIVE METABOLIC PANEL
ALT: 10 U/L (ref 0–35)
AST: 14 U/L (ref 0–37)
AST: 15 U/L (ref 0–37)
Albumin: 1.6 g/dL — ABNORMAL LOW (ref 3.5–5.2)
Albumin: 2.1 g/dL — ABNORMAL LOW (ref 3.5–5.2)
CO2: 30 mEq/L (ref 19–32)
Calcium: 8 mg/dL — ABNORMAL LOW (ref 8.4–10.5)
Chloride: 113 mEq/L — ABNORMAL HIGH (ref 96–112)
Chloride: 106 mEq/L (ref 96–112)
Creatinine, Ser: 2.03 mg/dL — ABNORMAL HIGH (ref 0.4–1.2)
Creatinine, Ser: 2.28 mg/dL — ABNORMAL HIGH (ref 0.4–1.2)
GFR calc Af Amer: 30 mL/min — ABNORMAL LOW (ref >60)
GFR calc Af Amer: 26 mL/min — ABNORMAL LOW (ref >60)
GFR calc non Af Amer: 22 mL/min — ABNORMAL LOW (ref >60)
Sodium: 143 mEq/L (ref 135–145)
Total Bilirubin: 0.4 mg/dL (ref 0.3–1.2)
Total Protein: 5.4 g/dL — ABNORMAL LOW (ref 6.0–8.3)

## 2010-04-27 LAB — CK TOTAL AND CKMB (NOT AT ARMC)
CK, MB: 1.7 ng/mL (ref 0.3–4.0)
Relative Index: 1.5 (ref 0.0–2.5)
Total CK: 112 U/L (ref 7–177)

## 2010-04-27 LAB — CARDIAC PANEL(CRET KIN+CKTOT+MB+TROPI)
CK, MB: 0.9 ng/mL (ref 0.3–4.0)
CK, MB: 1.1 ng/mL (ref 0.3–4.0)
Relative Index: INVALID (ref 0.0–2.5)
Relative Index: INVALID (ref 0.0–2.5)
Relative Index: INVALID (ref 0.0–2.5)
Total CK: 72 U/L (ref 7–177)
Total CK: 81 U/L (ref 7–177)
Troponin I: 0.01 ng/mL (ref 0.00–0.06)
Troponin I: 0.02 ng/mL (ref 0.00–0.06)
Troponin I: 0.02 ng/mL (ref 0.00–0.06)

## 2010-04-27 LAB — BLOOD GAS, ARTERIAL
O2 Content: 5 L/min
pCO2 arterial: 54.4 mmHg — ABNORMAL HIGH (ref 35.0–45.0)
pH, Arterial: 7.317 — ABNORMAL LOW (ref 7.350–7.400)

## 2010-04-27 LAB — CK: Total CK: 117 U/L (ref 7–177)

## 2010-04-27 LAB — PROTEIN ELECTROPHORESIS, SERUM
Albumin ELP: 35 % — ABNORMAL LOW (ref 55.8–66.1)
M-Spike, %: NOT DETECTED g/dL
Total Protein ELP: 6.3 g/dL (ref 6.0–8.3)

## 2010-04-27 LAB — STREP PNEUMONIAE URINARY ANTIGEN: Strep Pneumo Urinary Antigen: NEGATIVE

## 2010-04-27 LAB — VITAMIN B12: Vitamin B-12: 853 pg/mL (ref 211–911)

## 2010-04-27 LAB — MAGNESIUM
Magnesium: 1.7 mg/dL (ref 1.5–2.5)
Magnesium: 1.7 mg/dL (ref 1.5–2.5)

## 2010-04-27 LAB — URINE MICROSCOPIC-ADD ON

## 2010-04-27 LAB — RETICULOCYTES
RBC.: 2.56 MIL/uL — ABNORMAL LOW (ref 3.87–5.11)
Retic Count, Absolute: 48.6 10*3/uL (ref 19.0–186.0)
Retic Ct Pct: 1.9 % (ref 0.4–3.1)

## 2010-04-27 LAB — LIPID PANEL
HDL: 48 mg/dL (ref >39)
LDL Cholesterol: 70 mg/dL (ref 0–99)
Total CHOL/HDL Ratio: 2.9 RATIO
Triglycerides: 93 mg/dL (ref <150)
VLDL: 19 mg/dL (ref 0–40)

## 2010-04-27 LAB — POCT CARDIAC MARKERS: Myoglobin, poc: 240 ng/mL (ref 12–200)

## 2010-04-27 LAB — MICROALBUMIN / CREATININE URINE RATIO: Microalb, Ur: 122.49 mg/dL — ABNORMAL HIGH (ref 0.00–1.89)

## 2010-04-27 LAB — TSH: TSH: 2.696 u[IU]/mL (ref 0.350–4.500)

## 2010-04-27 LAB — URINALYSIS, ROUTINE W REFLEX MICROSCOPIC
Bilirubin Urine: NEGATIVE
Protein, ur: >300 mg/dL — AB
Urobilinogen, UA: 0.2 mg/dL (ref 0.0–1.0)

## 2010-04-27 NOTE — Op Note (Signed)
[name], MCEVERS NO.:  [id number]  MEDICAL RECORD NO.:  [id number]           PATIENT TYPE:  I  LOCATION:  2610                         FACILITY:  [facility]  PHYSICIAN:  Dickey Gave, MDDATE OF BIRTH:  02-02-1945  DATE OF PROCEDURE:  04/24/2010 DATE OF DISCHARGE:                              OPERATIVE REPORT   PREOPERATIVE DIAGNOSES:  Incarcerated ventral hernia with small bowel obstruction.  POSTOPERATIVE DIAGNOSES:  Incarcerated ventral hernia with small bowel obstruction.  PROCEDURES: 1. Exploratory laparotomy with lysis of adhesions x1 hour. 2. Explantation of mesh and chronic hernia sac. 3. Primary ventral hernia repair with partial release of his external     oblique on the right side onlay biologic with 16 x 20 cm Strattice     mesh.  SURGEON:  Dickey Gave, MD  ASSISTANT:  Ascencion Dike, PA-C  ANESTHESIA:  General endotracheal.  ANESTHESIOLOGIST:  Midge Minium, MD  ESTIMATED BLOOD LOSS:  200 mL.  DRAINS:  Two 19-French Blake drains to the subcutaneous space.  COMPLICATIONS:  None.  DISPOSITION:  To recovery room in stable condition.  INDICATIONS:  This is a 65 year old female with multiple comorbidities who has been operated before and has mesh in place for an incarcerated ventral hernia.  I saw her on Monday where she had been maintained with an NG tube for a number of days prior to this.  I discussed with her operating her, then she wanted to wait, and then we eventually were able to get her to the operating room.  She agreed to the operation.  I discussed with her an open ventral hernia repair with multiple possibilities as well as the risks.  PROCEDURE IN DETAIL:  After informed consent was obtained, the patient was taken to the operating room.  She was initially on 1 g of intravenous vancomycin due to her positive PCR first staph aureus.  She had sequential compression devices placed on her lower extremities.   She was then placed under general endotracheal anesthesia without complication.  Anesthesia removed her NG tube and were unable to pass a new one at this time despite multiple attempts.  She was then prepped and draped in standard sterile surgical fashion.  A surgical time-out was then performed.  A long midline incision was then made and dissection was carried out. Her prior mesh was anterior to her fascia.  I encountered that I divided this mesh.  Eventually, I was able to gain access to her peritoneal cavity superiorly; and I did about an hour of lysis of adhesions taking down the anterior abdominal wall adhesions.  She had an incarcerated obstructing hernia just to the right of midline where her mesh had lifted off her fascia.  I eventually was able to release all of this bowel from the hernia sac.  The bowel was run several times.  There were no enterotomies that were noted, and I excised most of this chronic mesh and hernia sac as well as a number of the spiral tacks that were there that I was able to locate.  At this point, they were unable to pass the  NG tube.  I consider putting a gastrostomy tube in her, but this was really difficult via the incision.  I asked Dr. Wilford Corner come in and he placed an NG tube using the gastroscope.  Following this, we then irrigated the abdomen copiously.  I then attempted to close the abdomen. It was tight at that point.  So, what I did is I made flaps in both directions and attempted to close it.  This was much improved.  I then partially released the external oblique on the right side, and I was able to, with #1 Novafil, closed the defect.  I did not want to place permanent mesh given her body habitus as well as the appearance in this chronic mesh that was in place at that time.  I then placed an onlay biologic and sutured this with #1 Novafil.  Drains were placed in the subcutaneous space.  I then closed the subcutaneous tissue with  3-0 Vicryl and the skin with staples.  Binder was placed and a sterile dressing was placed.  She tolerated this well, was transferred to recovery room.     Dickey Gave, MD     MCW/MEDQ  D:  04/24/2010  T:  04/25/2010  Job:  [id number]  Electronically Signed by Rolm Bookbinder MD on 04/27/2010 09:34:52 AM

## 2010-04-28 LAB — BASIC METABOLIC PANEL
GFR calc non Af Amer: 25 mL/min — ABNORMAL LOW (ref >60)
Potassium: 3.7 mEq/L (ref 3.5–5.1)
Sodium: 150 mEq/L — ABNORMAL HIGH (ref 135–145)

## 2010-04-28 LAB — GLUCOSE, CAPILLARY
Glucose-Capillary: 135 mg/dL — ABNORMAL HIGH (ref 70–99)
Glucose-Capillary: 144 mg/dL — ABNORMAL HIGH (ref 70–99)
Glucose-Capillary: 147 mg/dL — ABNORMAL HIGH (ref 70–99)

## 2010-04-29 LAB — COMPREHENSIVE METABOLIC PANEL
BUN: 40 mg/dL — ABNORMAL HIGH (ref 6–23)
CO2: 28 mEq/L (ref 19–32)
Chloride: 114 mEq/L — ABNORMAL HIGH (ref 96–112)
Creatinine, Ser: 1.89 mg/dL — ABNORMAL HIGH (ref 0.4–1.2)
GFR calc non Af Amer: 27 mL/min — ABNORMAL LOW (ref >60)
Total Bilirubin: 0.2 mg/dL — ABNORMAL LOW (ref 0.3–1.2)

## 2010-04-29 LAB — DIFFERENTIAL
Basophils Absolute: 0 10*3/uL (ref 0.0–0.1)
Eosinophils Absolute: 0.2 10*3/uL (ref 0.0–0.7)
Lymphocytes Relative: 19 % (ref 12–46)
Neutro Abs: 11.3 10*3/uL — ABNORMAL HIGH (ref 1.7–7.7)
Neutrophils Relative %: 72 % (ref 43–77)

## 2010-04-29 LAB — CBC
HCT: 26.2 % — ABNORMAL LOW (ref 36.0–46.0)
MCHC: 29.4 g/dL — ABNORMAL LOW (ref 30.0–36.0)
MCV: 92.3 fL (ref 78.0–100.0)
RDW: 16.7 % — ABNORMAL HIGH (ref 11.5–15.5)

## 2010-04-29 LAB — GLUCOSE, CAPILLARY
Glucose-Capillary: 177 mg/dL — ABNORMAL HIGH (ref 70–99)
Glucose-Capillary: 186 mg/dL — ABNORMAL HIGH (ref 70–99)
Glucose-Capillary: 201 mg/dL — ABNORMAL HIGH (ref 70–99)

## 2010-04-29 LAB — CHOLESTEROL, TOTAL: Cholesterol: 103 mg/dL (ref 0–200)

## 2010-04-30 LAB — CBC
HCT: 24.2 % — ABNORMAL LOW (ref 36.0–46.0)
MCHC: 31 g/dL (ref 30.0–36.0)
MCV: 92.4 fL (ref 78.0–100.0)
RDW: 16.6 % — ABNORMAL HIGH (ref 11.5–15.5)

## 2010-04-30 LAB — GLUCOSE, CAPILLARY: Glucose-Capillary: 239 mg/dL — ABNORMAL HIGH (ref 70–99)

## 2010-04-30 LAB — BASIC METABOLIC PANEL
BUN: 41 mg/dL — ABNORMAL HIGH (ref 6–23)
Calcium: 8.6 mg/dL (ref 8.4–10.5)
GFR calc non Af Amer: 27 mL/min — ABNORMAL LOW (ref >60)
Glucose, Bld: 184 mg/dL — ABNORMAL HIGH (ref 70–99)

## 2010-05-01 LAB — GLUCOSE, CAPILLARY
Glucose-Capillary: 187 mg/dL — ABNORMAL HIGH (ref 70–99)
Glucose-Capillary: 179 mg/dL — ABNORMAL HIGH (ref 70–99)
Glucose-Capillary: 231 mg/dL — ABNORMAL HIGH (ref 70–99)
Glucose-Capillary: 256 mg/dL — ABNORMAL HIGH (ref 70–99)

## 2010-05-01 LAB — BASIC METABOLIC PANEL
BUN: 43 mg/dL — ABNORMAL HIGH (ref 6–23)
Chloride: 111 mEq/L (ref 96–112)
Creatinine, Ser: 1.8 mg/dL — ABNORMAL HIGH (ref 0.4–1.2)
Glucose, Bld: 210 mg/dL — ABNORMAL HIGH (ref 70–99)

## 2010-05-02 LAB — COMPREHENSIVE METABOLIC PANEL
AST: 55 U/L — ABNORMAL HIGH (ref 0–37)
Albumin: 1.8 g/dL — ABNORMAL LOW (ref 3.5–5.2)
Alkaline Phosphatase: 55 U/L (ref 39–117)
BUN: 48 mg/dL — ABNORMAL HIGH (ref 6–23)
Chloride: 107 mEq/L (ref 96–112)
Creatinine, Ser: 1.94 mg/dL — ABNORMAL HIGH (ref 0.4–1.2)
GFR calc Af Amer: 31 mL/min — ABNORMAL LOW (ref >60)
Potassium: 3.8 mEq/L (ref 3.5–5.1)
Total Bilirubin: 0.4 mg/dL (ref 0.3–1.2)
Total Protein: 5.2 g/dL — ABNORMAL LOW (ref 6.0–8.3)

## 2010-05-02 LAB — CBC
MCV: 90.5 fL (ref 78.0–100.0)
Platelets: 236 10*3/uL (ref 150–400)
RBC: 2.52 MIL/uL — ABNORMAL LOW (ref 3.87–5.11)
RDW: 16.4 % — ABNORMAL HIGH (ref 11.5–15.5)
WBC: 13.7 10*3/uL — ABNORMAL HIGH (ref 4.0–10.5)

## 2010-05-02 LAB — GLUCOSE, CAPILLARY
Glucose-Capillary: 210 mg/dL — ABNORMAL HIGH (ref 70–99)
Glucose-Capillary: 134 mg/dL — ABNORMAL HIGH (ref 70–99)

## 2010-05-02 LAB — PHOSPHORUS: Phosphorus: 4.2 mg/dL (ref 2.3–4.6)

## 2010-05-03 LAB — CROSSMATCH
ABO/RH(D): A POS
Antibody Screen: NEGATIVE
Unit division: 0

## 2010-05-03 LAB — GLUCOSE, CAPILLARY
Glucose-Capillary: 155 mg/dL — ABNORMAL HIGH (ref 70–99)
Glucose-Capillary: 204 mg/dL — ABNORMAL HIGH (ref 70–99)
Glucose-Capillary: 237 mg/dL — ABNORMAL HIGH (ref 70–99)

## 2010-05-03 LAB — BASIC METABOLIC PANEL
BUN: 54 mg/dL — ABNORMAL HIGH (ref 6–23)
CO2: 26 mEq/L (ref 19–32)
Chloride: 105 mEq/L (ref 96–112)
Glucose, Bld: 188 mg/dL — ABNORMAL HIGH (ref 70–99)
Potassium: 4 mEq/L (ref 3.5–5.1)

## 2010-05-03 LAB — CBC
HCT: 27.9 % — ABNORMAL LOW (ref 36.0–46.0)
Hemoglobin: 8.8 g/dL — ABNORMAL LOW (ref 12.0–15.0)
MCH: 27.9 pg (ref 26.0–34.0)
MCV: 88.6 fL (ref 78.0–100.0)
RBC: 3.15 MIL/uL — ABNORMAL LOW (ref 3.87–5.11)
WBC: 15.6 10*3/uL — ABNORMAL HIGH (ref 4.0–10.5)

## 2010-05-04 LAB — GLUCOSE, CAPILLARY
Glucose-Capillary: 99 mg/dL (ref 70–99)
Glucose-Capillary: 147 mg/dL — ABNORMAL HIGH (ref 70–99)

## 2010-05-04 LAB — CBC
HCT: 28.6 % — ABNORMAL LOW (ref 36.0–46.0)
RBC: 3.19 MIL/uL — ABNORMAL LOW (ref 3.87–5.11)
RDW: 16.4 % — ABNORMAL HIGH (ref 11.5–15.5)
WBC: 12.3 10*3/uL — ABNORMAL HIGH (ref 4.0–10.5)

## 2010-05-04 LAB — BASIC METABOLIC PANEL
GFR calc non Af Amer: 21 mL/min — ABNORMAL LOW (ref >60)
Glucose, Bld: 170 mg/dL — ABNORMAL HIGH (ref 70–99)
Potassium: 4.1 mEq/L (ref 3.5–5.1)
Sodium: 134 mEq/L — ABNORMAL LOW (ref 135–145)

## 2010-05-05 LAB — CBC
Hemoglobin: 9.6 g/dL — ABNORMAL LOW (ref 12.0–15.0)
MCH: 28.2 pg (ref 26.0–34.0)
MCHC: 31.5 g/dL (ref 30.0–36.0)
Platelets: 245 10*3/uL (ref 150–400)
RDW: 16.7 % — ABNORMAL HIGH (ref 11.5–15.5)

## 2010-05-05 LAB — GLUCOSE, CAPILLARY
Glucose-Capillary: 67 mg/dL — ABNORMAL LOW (ref 70–99)
Glucose-Capillary: 165 mg/dL — ABNORMAL HIGH (ref 70–99)

## 2010-05-05 LAB — BASIC METABOLIC PANEL
CO2: 25 mEq/L (ref 19–32)
Calcium: 7.1 mg/dL — ABNORMAL LOW (ref 8.4–10.5)
Creatinine, Ser: 2.27 mg/dL — ABNORMAL HIGH (ref 0.4–1.2)
GFR calc Af Amer: 26 mL/min — ABNORMAL LOW (ref >60)
GFR calc non Af Amer: 22 mL/min — ABNORMAL LOW (ref >60)
Sodium: 141 mEq/L (ref 135–145)

## 2010-05-06 LAB — GLUCOSE, CAPILLARY
Glucose-Capillary: 110 mg/dL — ABNORMAL HIGH (ref 70–99)
Glucose-Capillary: 185 mg/dL — ABNORMAL HIGH (ref 70–99)

## 2010-05-06 LAB — COMPREHENSIVE METABOLIC PANEL
BUN: 51 mg/dL — ABNORMAL HIGH (ref 6–23)
Calcium: 8.1 mg/dL — ABNORMAL LOW (ref 8.4–10.5)
Glucose, Bld: 132 mg/dL — ABNORMAL HIGH (ref 70–99)
Sodium: 142 mEq/L (ref 135–145)
Total Protein: 5.4 g/dL — ABNORMAL LOW (ref 6.0–8.3)

## 2010-05-06 LAB — PHOSPHORUS: Phosphorus: 3.9 mg/dL (ref 2.3–4.6)

## 2010-05-06 LAB — CBC
MCH: 28.2 pg (ref 26.0–34.0)
MCV: 90 fL (ref 78.0–100.0)
Platelets: 228 10*3/uL (ref 150–400)
RDW: 16.6 % — ABNORMAL HIGH (ref 11.5–15.5)
WBC: 9.7 10*3/uL (ref 4.0–10.5)

## 2010-05-06 LAB — DIFFERENTIAL
Basophils Relative: 0 % (ref 0–1)
Eosinophils Absolute: 0.2 10*3/uL (ref 0.0–0.7)
Eosinophils Relative: 2 % (ref 0–5)
Lymphs Abs: 2.2 10*3/uL (ref 0.7–4.0)

## 2010-05-06 LAB — CHOLESTEROL, TOTAL: Cholesterol: 124 mg/dL (ref 0–200)

## 2010-05-06 LAB — TRIGLYCERIDES: Triglycerides: 128 mg/dL (ref <150)

## 2010-05-06 LAB — MAGNESIUM: Magnesium: 1.9 mg/dL (ref 1.5–2.5)

## 2010-05-06 IMAGING — US US CAROTID DUPLEX BILAT
1 series · 13 of 24 positions shown · non-contrast
Comparison: None available

CLINICAL DATA: Remote stroke.  Retinal artery occlusion.
Hypertension, diabetes, previous tobacco use.

BILATERAL CAROTID DUPLEX ULTRASOUND
TECHNIQUE: Gray scale imaging, color Doppler and duplex ultrasound
was performed of bilateral carotid and vertebral arteries in the
neck.

[Series 1: us carotid duplex bilat · 0.07mm/px · 13 of 58 slices shown]
[im 1/58]
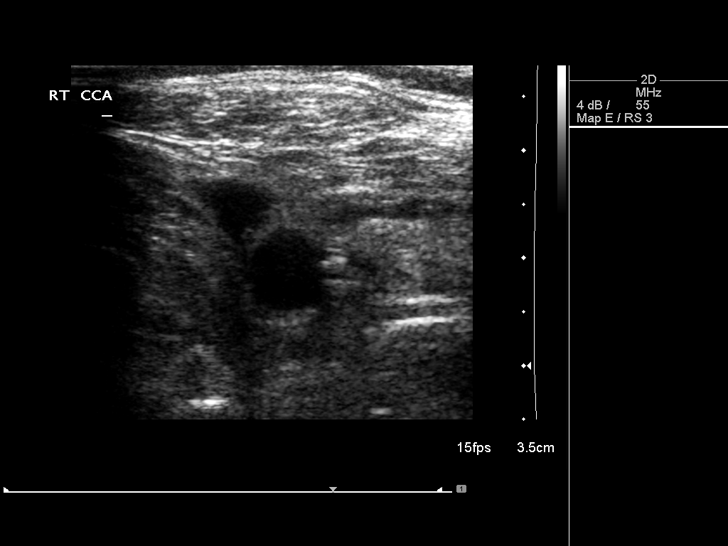
[im 5/58]
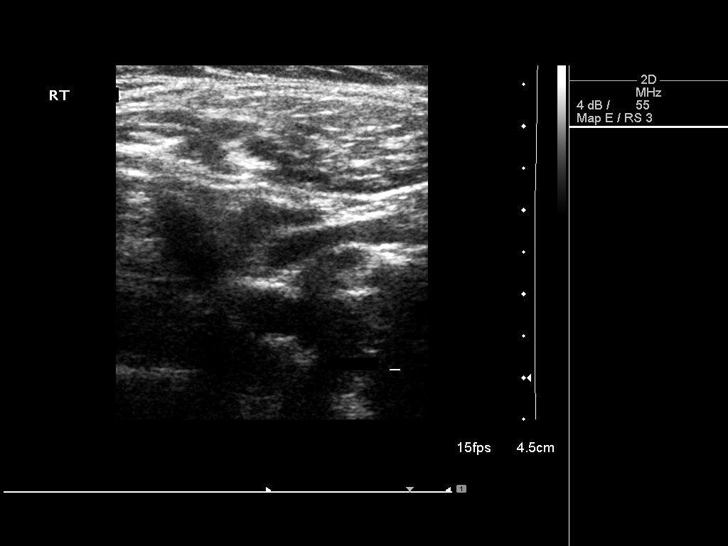
[im 10/58]
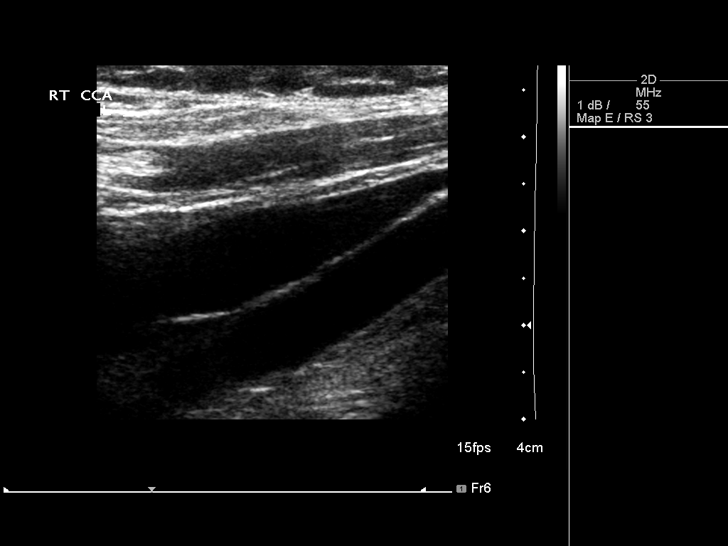
[im 15/58]
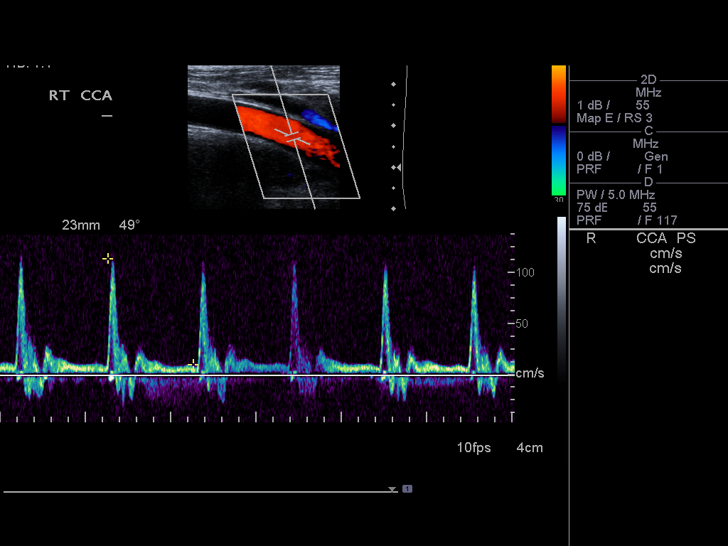
[im 20/58]
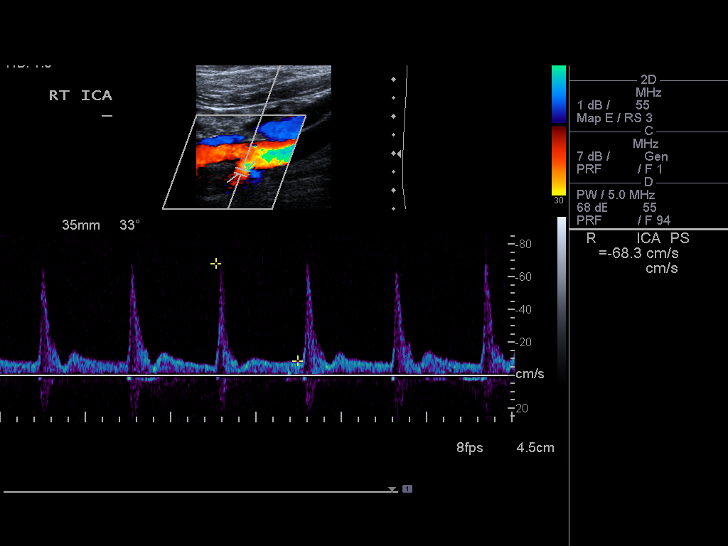
[im 25/58]
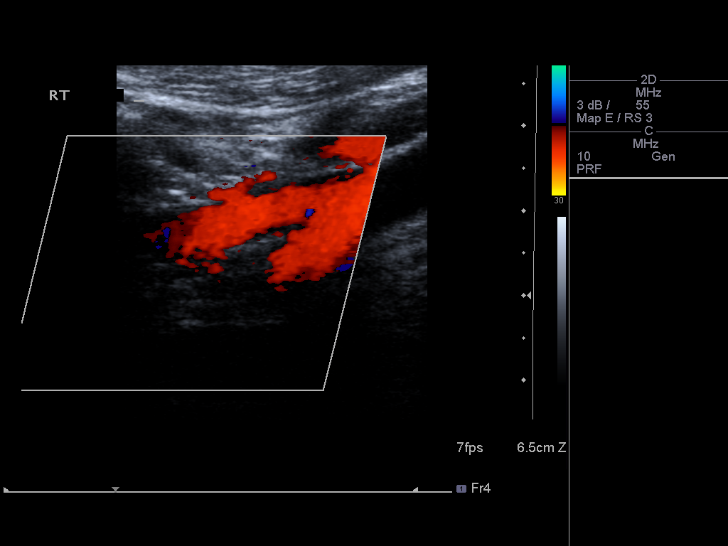
[im 30/58]
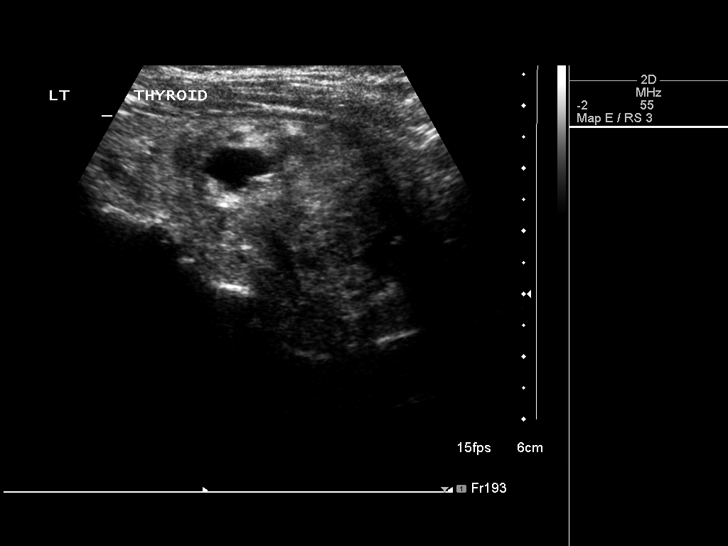
[im 33/58]
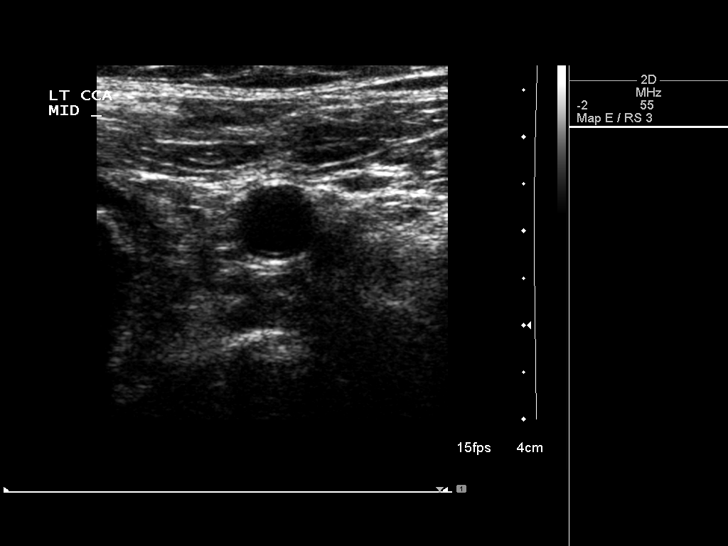
[im 38/58]
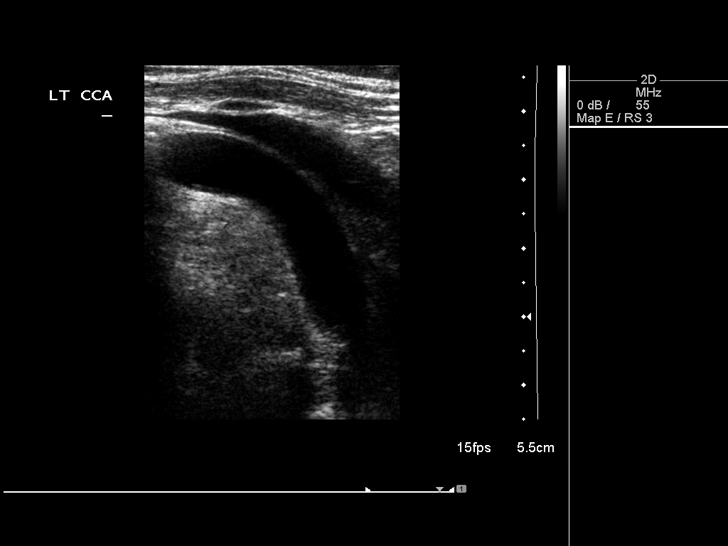
[im 43/58]
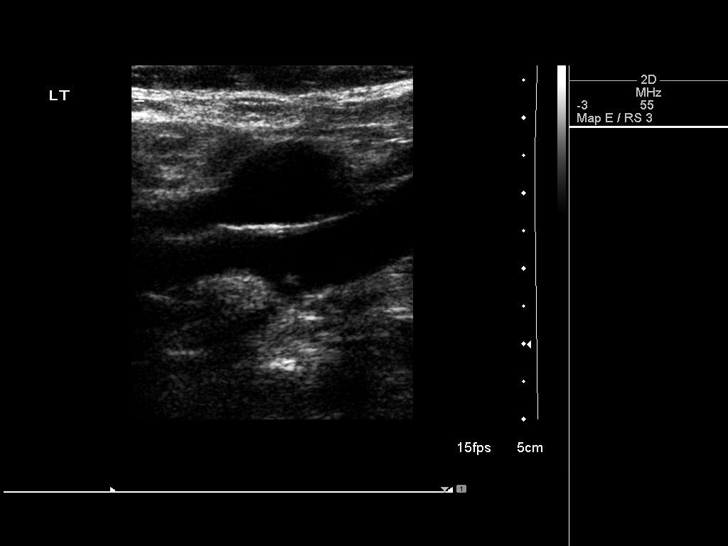
[im 48/58]
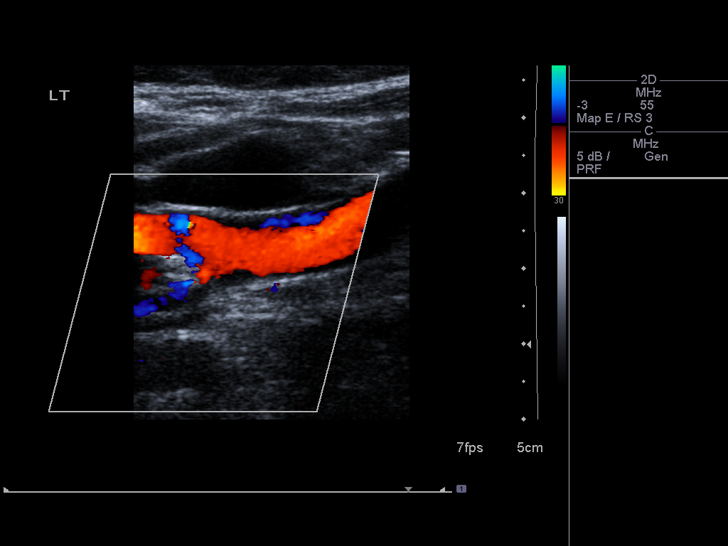
[im 53/58]
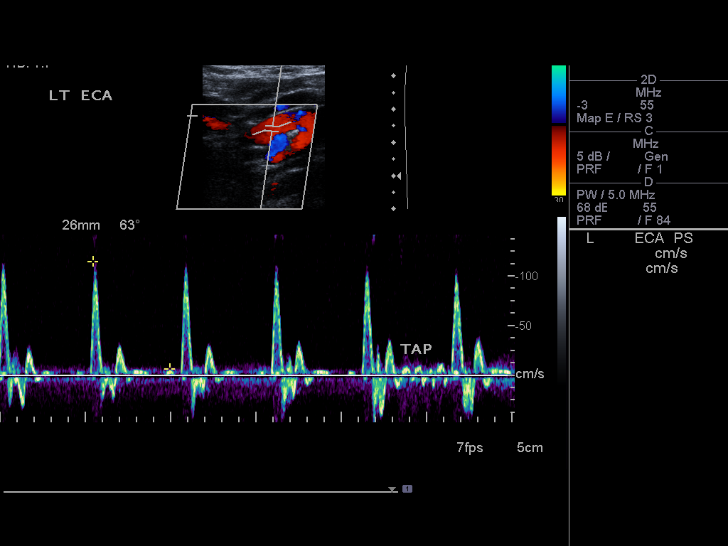
[im 58/58]
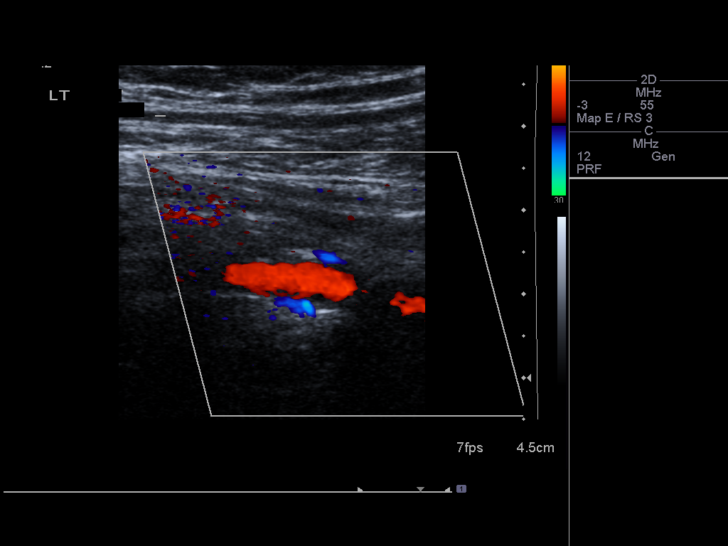

[13 of 24 positions shown; findings below may reference images not displayed]

Criteria:  Quantification of carotid stenosis is based on velocity
parameters that correlate the residual internal carotid diameter
with NASCET-based stenosis levels, using the diameter of the distal
internal carotid lumen as the denominator for stenosis measurement.

The following velocity measurements were obtained:

                 PEAK SYSTOLIC/END DIASTOLIC
RIGHT
ICA:                        72/8cm/sec
CCA:                        125/14cm/sec
SYSTOLIC ICA/CCA RATIO:
DIASTOLIC ICA/CCA RATIO:
ECA:                        88cm/sec

LEFT
ICA:                        56/9cm/sec
CCA:                        105/14cm/sec
SYSTOLIC ICA/CCA RATIO:
DIASTOLIC ICA/CCA RATIO:
ECA:                        115cm/sec
FINDINGS: RIGHT CAROTID ARTERY: No significant plaque accumulation or
stenosis.  Normal wave forms with no focal aliasing on color
Doppler interrogation

RIGHT VERTEBRAL ARTERY:  Normal flow direction and wave form.

LEFT CAROTID ARTERY: The left lobe of the thyroid is noted to be
enlarged and heterogeneous, incompletely evaluated.  There is
minimal eccentric noncalcified plaque in the carotid bulb without
high-grade stenosis.  Normal wave forms with no focal aliasing on
color Doppler interrogation.

LEFT VERTEBRAL ARTERY:  Normal flow direction and wave form.
IMPRESSION: 1.  Minimal plaque in the left carotid bulb without significant
stenosis. The exam does not exclude plaque ulceration or
embolization.  Continued surveillance recommended.
2.  Enlarged heterogeneous left lobe of the thyroid.  Consider
thyroid ultrasound for complete evaluation to exclude dominant mass
or nodule.

## 2010-05-06 NOTE — Consult Note (Signed)
  Tiffany, [name] NO.:  [id number]  MEDICAL RECORD NO.:  [id number]           PATIENT TYPE:  LOCATION:                                 FACILITY:  PHYSICIAN:  Ardyth Gal. Manasseh Pittsley, M.D.DATE OF BIRTH:  December 08, 1945  DATE OF CONSULTATION: DATE OF DISCHARGE:                                CONSULTATION   PAST MEDICAL HISTORY:  Significant for; 1. Incarcerated ventral hernia. 2. Chronic obstructive pulmonary disease. 3. Sleep apnea. 4. Morbid obesity. 5. Diabetes associated with neuropathy. 6. Hypertension. 7. History of diastolic heart failure. 8. Chronic renal insufficiency. 9. Gout. 10.Anxiety and depression.  MEDICATIONS:  The patient's current medications include the following. Albuterol inhaler, calcium carbonate, enalapril, ferrous sulfate, furosemide, hydralazine, insulin, isosorbide mononitrate, nitroglycerin paste, Crestor, and currently oxycodone.  ALLERGIES:  None known.  PHYSICAL EXAMINATION:  VITAL SIGNS:  Blood pressure 184/88, temperature 99.8, pulse 94, and respiration 18. GENERAL:  A morbidly obese white woman examined, in moderate to acute distress. HEAD, EYES, EARS, NOSE, AND THROAT:  Essentially unremarkable. NECK:  The patient does have hepatojugular reflux.  No carotid bruit. THORAX:  No chest wall tenderness elicited. LUNGS:  Decreased breath sounds in both bases with mild percussion dullness. CARDIOVASCULAR:  S1 and S2 noted.  The patient does have an S3 gallop. No rales.  No thrills. ABDOMEN:  Soft, obese, and nontender. EXTREMITIES:  Reveals 1+ pitting edema bilaterally.  LABORATORY DATA:  Laboratory reveals the following the patient.  Thyroid function is normal.  Cardiac panel reveals a normal CK.  CK-MB also unremarkable.  Serum troponin less than 0.01.  Lipid profile revealed serum cholesterol of 118 with a LDL cholesterol of 59.  CBC reveals hemoglobin of 8.16, hematocrit 27.1, and white blood cell count of 11,600.  The  patient's creatinine was 2.26 and BUN is 24.  RADIOLOGIC STUDIES:  The patient x-ray reveals cardiomegaly with pulmonary vascular congestion.  No overt edema.  Electrocardiogram reveals normal sinus rhythm.  Normal EKG.  ASSESSMENT: 1. The patient has long history of diastolic congestive heart failure.     She will be treated with the appropriate medications suggests some     vigorous diuresis. 2. Hypertension. 3. Diabetes. 4. History of chronic obstructive pulmonary disease. 5. Morbid obesity. 6. History of sleep apnea. 7. History of gout.  SUGGESTIONS: 1. We agree the patient medications at the present time.  We will     intensify diuresis in this particular patient as well as provide     her with the CPAP machine. 2. Also, continue to rule out myocardial infarction. 3. I will contact Dr. Doylene Canard to inform him of this patient's     admission.     Ardyth Gal. Americus Perkey, M.D.     JOS/MEDQ  D:  04/13/2010  T:  04/13/2010  Job:  [id number]  Electronically Signed by Wallene Huh M.D. on 05/06/2010 08:45:53 PM

## 2010-05-12 NOTE — Op Note (Signed)
NAMENURI, TICKNOR NO.:  [id number]  MEDICAL RECORD NO.:  [id number]           PATIENT TYPE:  LOCATION:                                 FACILITY:  PHYSICIAN:  Lear Ng, MDDATE OF BIRTH:  14-Nov-1945  DATE OF PROCEDURE: DATE OF DISCHARGE:                              OPERATIVE REPORT   UPPER ENDOSCOPY INDICATION:  Inability to pass the NG tube during surgery.  Request by Dr. Donne Hazel for endoscopic evaluation of esophagus and passage of NG tube.  MEDICATIONS:  Per Anesthesia.  FINDINGS:  Endoscope was inserted into the oropharynx where there was evidence of oropharyngeal edema and erythema consistent with trauma from previous NG tube attempts.  Endoscope was advanced into the esophagus where there was scattered fresh blood seen in portions of the proximal esophagus likely due to passage of blood from the oropharynx from trauma as stated above.  Endoscope was advanced into the stomach without any resistance.  In the distal stomach, there was a 1 cm ulcer with food particles that would not completely wash away.  No active bleeding was seen.  Retroflexion was done which revealed normal proximal stomach. Endoscope was straightened and advanced to the duodenal bulb and second portion of the duodenum which were unremarkable.  Endoscope was withdrawn back into the stomach and the CRNA from Anesthesia passed a nasogastric tube through the right naris when he encountered resistance. The endoscope was pulled back into the esophagus and the tip of the NG tube was noted to be in the distal third of the esophagus.  The NG tube was attempted to be advanced further, but the tip of it would not advance any further despite the back side of it advancing in the nose. Likely, the middle portion of the NG tube was meeting resistance from the edema and curling up in the oropharynx.  A snare was then inserted through the working channel of the endoscope and used with  an attempt to grasp the tip of the NG tube without success.  The snare was withdrawn and a rat tooth forceps was inserted with attempt to grasp the tip of the tube without success on several attempts.  The rat tooth forceps was withdrawn and a tripod was inserted without success.  The rat tooth forceps was then reinserted and successfully grabbed the distal portion of the NG tube.  The rat tooth forceps was then used to advance the nasogastric tube with the endoscope into the distal stomach and the rat tooth forceps was released from the NG tube with the NG tube tip remaining in the distal portion of the stomach.  The NG tube was secured in place and the endoscope was removed keeping the NG tube in the proper position.  ASSESSMENT: 1. Difficult NG tube passage secondary to oropharyngeal edema.     Successful placement of the NG tube with the endoscope using a rat     tooth forceps as stated above. 2. Distal gastric ulcer.  PLAN: 1. NG suction per surgical team. 2. IV Protonix q.12 h.     Lear Ng, MD     VCS/MEDQ  D:  04/24/2010  T:  04/25/2010  Job:  [id number]  cc:   Dickey Gave, MD  Electronically Signed by Wilford Corner MD on 05/12/2010 12:32:30 PM

## 2010-05-20 ENCOUNTER — Encounter (HOSPITAL_COMMUNITY): Payer: No Typology Code available for payment source | Attending: Nephrology

## 2010-05-20 ENCOUNTER — Other Ambulatory Visit: Payer: Self-pay | Admitting: Nephrology

## 2010-05-20 DIAGNOSIS — D638 Anemia in other chronic diseases classified elsewhere: Secondary | ICD-10-CM | POA: Insufficient documentation

## 2010-05-20 DIAGNOSIS — N183 Chronic kidney disease, stage 3 unspecified: Secondary | ICD-10-CM | POA: Insufficient documentation

## 2010-05-20 LAB — RENAL FUNCTION PANEL
BUN: 32 mg/dL — ABNORMAL HIGH (ref 6–23)
CO2: 21 mEq/L (ref 19–32)
Calcium: 8.3 mg/dL — ABNORMAL LOW (ref 8.4–10.5)
Creatinine, Ser: 1.97 mg/dL — ABNORMAL HIGH (ref 0.4–1.2)
Glucose, Bld: 171 mg/dL — ABNORMAL HIGH (ref 70–99)

## 2010-05-20 LAB — POCT HEMOGLOBIN-HEMACUE: Hemoglobin: 10.7 g/dL — ABNORMAL LOW (ref 12.0–15.0)

## 2010-05-20 LAB — IRON AND TIBC
Iron: 48 ug/dL (ref 42–135)
UIBC: 110 ug/dL

## 2010-05-21 LAB — FERRITIN: Ferritin: 1271 ng/mL — ABNORMAL HIGH (ref 10–291)

## 2010-06-03 ENCOUNTER — Encounter (HOSPITAL_COMMUNITY)
Admission: RE | Admit: 2010-06-03 | Discharge: 2010-06-03 | Disposition: A | Discharge status: Home / Self Care | Payer: No Typology Code available for payment source | Source: Ambulatory Visit | Attending: Nephrology | Admitting: Nephrology

## 2010-06-03 ENCOUNTER — Other Ambulatory Visit: Payer: Self-pay | Admitting: Nephrology

## 2010-06-03 DIAGNOSIS — D638 Anemia in other chronic diseases classified elsewhere: Secondary | ICD-10-CM | POA: Insufficient documentation

## 2010-06-03 DIAGNOSIS — N183 Chronic kidney disease, stage 3 unspecified: Secondary | ICD-10-CM | POA: Insufficient documentation

## 2010-06-04 LAB — POCT HEMOGLOBIN-HEMACUE: Hemoglobin: 10.8 g/dL — ABNORMAL LOW (ref 12.0–15.0)

## 2010-06-04 NOTE — Discharge Summary (Signed)
NAMEARGIE, AMSTERDAM NO.:  [id number]   MEDICAL RECORD NO.:  [id number]          PATIENT TYPE:  INP   LOCATION:  5742                         FACILITY:  [facility]   PHYSICIAN:  Kathrin Penner, M.D.   DATE OF BIRTH:  May 22, 1945   DATE OF ADMISSION:  06/19/2006  DATE OF DISCHARGE:  06/30/2006                               DISCHARGE SUMMARY   ADMISSION DIAGNOSES:  1. Recurrent incarcerated ventral hernia.  2. Chronic obstructive pulmonary disease.  3. Sleep apnea.  4. Morbid obesity.  5. Diabetes mellitus with associated neuropathy.  6. Hypertension.  7. History of congestive heart failure.  8. Chronic renal insufficiency.  9. Gout.  10.Anxiety and depression.  11.Gastroesophageal reflux disease.   DISCHARGE DIAGNOSES:  1. Recurrent incarcerated ventral hernia.  2. Chronic obstructive pulmonary disease.  3. Sleep apnea.  4. Morbid obesity.  5. Diabetes mellitus with associated neuropathy.  6. Hypertension.  7. History of congestive heart failure.  8. Chronic renal insufficiency.  9. Gout.  10.Anxiety and depression.  11.Gastroesophageal reflux disease.   PROCEDURES PERFORMED AND CONSULTATIONS:  1. Repair of incarcerated ventral hernia.  2. Critical care medicine / pulmonary for hypoxemia, hypercarbia.  3. Renal medicine for acute on chronic renal failure.   MEDICATIONS ON DISCHARGE:  1. Ventolin IH 2.5 mg 3 times per day.  2. Atrovent 0.5 mg IH 3 times per day.  3. Home O2 at 3 liters/min.  4. CPAP at bedtime.  5. Lantus insulin 40 units in the morning, and 30 units in the      evening.  6. Actos 45 mg daily.  7. Lasix 40 mg twice per day.  8. Clonidine 0.1 mg twice per day.  9. Sular 10 mg daily.  10.Torsemide 20 mg twice per day.  11.Protonix 40 mg daily.  12.Lanthanum carbonate 1000 mg 3 times per day.  13.K-Dur 40 mg daily.  14.Nephro-Vite one tablet daily.   FOLLOW-UP:  1. Home health nurse with home health aide.  2. Follow up with  [facility], Dr. Erling Cruz, for      continued evaluation of decreased renal function.  3. Dr. Molli Knock, cardiology, for continued monitoring of heart disease.  4. She will be seen back in [facility] by [place] for follow-      up care of ventral hernia repair.   HOSPITAL STAY SUMMARY:  This is a 65 year old morbidly female with a  painful incarcerated ventral hernia who was admitted to the hospital on  the day of surgery where she underwent an uncomplicated ventral hernia  repair.  She was admitted to the stepdown ICU for continued observation  due to her multiple medical comorbidities.  Evaluation showed her to be  mildly hypoxemic and hypercarbic.  She was continued on BiPAP at night  and nasal O2 at 3 liters/min during the daytime.  She was seen in  consultation by Dr. Patsey Berthold for continued treatment of this.  Chest x-  ray showed no evidence of pneumonia, although there was some question as  to whether or not she may have aspirated.  With respect to  her pulmonary  status, the patient improved over time and was breathing comfortably on  her current regimen.   She was also seen in consultation by Dr. Erling Cruz from the  standpoint of her renal insufficiency.  The patient did show serial  rising creatinines from a baseline of 2.26 to a high of 2.76.  Her  urinalysis was normal and she was subsequently treated with diuresis  with a subsequent return of her creatinines to baseline.   The patient was subsequently transferred from the ICU setting to the  floor where she continued to improve.  She was discharged home with home  health aide and visiting nurse follow-up, with follow-up plans as noted  above.      Kathrin Penner, M.D.  Electronically Signed     PB/MEDQ  D:  07/29/2006  T:  07/29/2006  Job:  [id number]

## 2010-06-04 NOTE — Consult Note (Signed)
[name], [name] NO.:  [id number]   MEDICAL RECORD NO.:  [id number]          PATIENT TYPE:  INP   LOCATION:  2916                         FACILITY:  [facility]   PHYSICIAN:  Sherril Croon, M.D.   DATE OF BIRTH:  13-Jul-1945   DATE OF CONSULTATION:  09/11/2008  DATE OF DISCHARGE:                                 CONSULTATION   REASON FOR CONSULTATION:  Chronic kidney disease and anemia.   HISTORY OF PRESENT ILLNESS:  The patient is a 65 year old African  American female with past medical history of congestive heart failure,  type 2 diabetes, hypertension, morbid obesity, chronic kidney disease,  chronic anemia, and was admitted to the hospital for shortness of  breath.  Her shortness of breath started about 3 days ago and she drank  a lot of fluids in the past three days before she developed shortness of  breath.  Because of the worsening shortness of breath, she came to the  ED 2 days ago and was admitted to step-down unit for congestive heart  failure exacerbation with fluid overload.  When I saw her, she had no  chest pain and her shortness of breath had significantly improved after  giving diuretics.  She had no chest pain, no fever, no dysuria or  diarrhea. She had been using all of her medications at home.  She also  had been using a CPAP machine and home oxygen for her obstructive sleep  apnea and COPD.  She denies use of any drugs, alcohol, or smoking.   ALLERGIES:  No known drug allergy history.   PAST MEDICAL HISTORY:  She has chronic kidney disease for 2 years and  her baseline creatinine is about 2.2.  She also has congestive heart  failure; COPD; obstructive sleep apnea, on CPAP; morbid obesity; type 2  diabetes for 30 years; hypertension; gout; anxiety and depression; GERD;  and anemia and her baseline hemoglobin is 8.   HOME MEDICATIONS:  1. Enalapril 20 mg b.i.d.  2. Lasix 40 mg b.i.d.  3. Metoprolol 50 mg daily.  4. Clonidine 0.3 mg b.i.d.  5. Potassium chloride 10 mEq daily.  6. Ferrous sulfate 325 mg b.i.d.  7. Lantus 10 units subcutaneous injection at bedtime daily.  8. Aspirin 81 mg daily.   SOCIAL HISTORY:  The patient lives with her sister.  No drugs, smoking,  or alcohol abuse.   FAMILY HISTORY:  Noncontributory.   REVIEW OF SYSTEMS:  See HPI.   MEDICATIONS:  VITAL SIGNS:  Temperature 98.6, blood pressure 170/73,  heart rate 51, respiratory rate 20, O2 sats 100% on 4 L.  GENERAL:  No acute distress.  HEENT:  Moist mucosa.  No exudate.  NECK:  Supple.  No thyroid enlargement.  No JVD.  LUNGS:  Bibasilar crackles.  No wheezing.  Good air movement.  HEART:  Regular rate and rhythm.  No murmur or gallop.  ABDOMEN:  Soft.  Bowel sounds positive.  No tenderness or distention.  Obese.  EXTREMITIES:  Bilateral leg pitting edema at 1+.  NEUROLOGIC:  Alert and oriented x3.  No focal neurological  findings.   LABORATORY DATA:  Hemoglobin 8.2, white blood cells 8.7, platelets 236.  Sodium 144, potassium 4.2, chloride 109, bicarb 29, BUN 25, creatinine  2.16, glucose 91, phosphorus 4.7, magnesium 1.7, calcium 8.1.  BNP 143.  Anemia panel:  Iron 22, total iron binding capacity of 160, and 14%  saturation.  Ferritin level 806.  Folate 19.6.  Vitamin B12 853.  UA  shows a white blood cell of 7-10, rbc's 0-2.  Urine culture negative.  A1c 4.6.  Cardiac enzymes:  Troponin is less than 0.05 x3.  Chest x-ray  shows cardiomegaly with pulmonary edema.  CT of abdomen and pelvis in  November 2007 shows left adrenal adenoma of 1.2 x 1.8 cm and right  kidney lesion that is chronic about 1.7 x 2.1 cm.   ASSESSMENT AND PLAN:  1. Chronic kidney disease, stage IV.  This is likely due to diabetes      and hypertension. Because her creatinine had been stable at her      baseline 2.2, so we will agree for the current management      controlling diabetes, hypertension.  In addition, we would like to      do the renal ultrasound for her  previous right kidney lesion and      also to rule out any obstruction. we also check her spot urine      microalbumin, and creatinine as well as its ratio.  We will check      the serum protein electrophoresis, spot urine urine protein      electrophoresis to rule out multiple myeloma.  2. Anemia.  The patient had been given blood transfusion and her      hemoglobin is now at 8.2 from 7.1 and I will give the patient IV      Venofer as well as Aranesp 60 mcg IV per week, then monitor CBC.  3. Fluid overload.  The patient has both leg edema and bibasilar      pulmonary crackles and she also has chronic congestive heart      failure and pulmonary edema.  We agreed for the Lasix 80 mg IV q.8      h., and now the patient's shortness of breath has significantly      improved, will continue diuresis.      Tiffany Kenning, MD  Electronically Signed      Sherril Croon, M.D.  Electronically Signed    ZY/MEDQ  D:  09/11/2008  T:  09/12/2008  Job:  [id number]

## 2010-06-04 NOTE — H&P (Signed)
[name], [name] NO.:  [id number]   MEDICAL RECORD NO.:  [id number]          PATIENT TYPE:  EMS   LOCATION:  MINO                         FACILITY:  [facility]   PHYSICIAN:  [name], MDDATE OF BIRTH:  [birth year]   DATE OF ADMISSION:  09/09/2008  DATE OF DISCHARGE:                              HISTORY & PHYSICAL   PRIMARY CARE PHYSICIAN:  Royetta Crochet. Karlton Lemon, M.D.   PRIMARY CARDIOLOGIST:  Birdie Riddle, M.D.   CHIEF COMPLAINT:  Shortness of breath.   HISTORY OF PRESENT ILLNESS:  A 65 year old female with known history of  morbid obesity, hypertension, chronic anemia, diabetes mellitus type 2  who presented to the  ER with complaints of shortness of breath.  The  patient states that she was only short of breath overnight while she was  sleeping and presented to the ER.  In the ER the patient had chest x-ray  which showed features of pulmonary edema and was given Lasix 80 mg and  she diuresed well and shortness of breath improved now at this time.  The patient is also on nitroglycerin drip.   The patient denies any chest pain.  Denies any palpitation.  Denies any  loss of consciousness, focal deficit, abdominal pain, nausea, vomiting,  diarrhea, fever, chills or abdominal pain.  Specifically the patient  denies any blood in the stools and she knows she is anemic and is on  iron pills.   The patient is presently admitted for management of decompensated CHF  and will also need to address anemia once her CHF has been compensated.   PAST MEDICAL HISTORY:  CHF diastolic,  hypertension, diabetes mellitus  type 2, morbid obesity, chronic anemia.   PAST SURGICAL HISTORY:  Thyroid surgery and left knee replacement.   MEDICATIONS ON ADMISSION:  This was confirmed with [facility] at [facility]:  1. Enalapril 20 mg p.o. b.i.d.  2. Lasix 20 mg 2 tablets daily.  3. Metoprolol 50 mg p.o. b.i.d.  4. Lipitor 40 mg p.o. daily.  5. Clonidine 0.3 mg p.o.  b.i.d.  6. Potassium chloride 10 mEq p.o. daily.  7. Lantus10 units subcutaneous at bedtime as well as prescribed; the      patient takes it twice a day.  The patient was found to be mildly      hypoglycemic presently asymptomatic.  8. Aspirin 81 mg p.o. daily.   ALLERGIES:  No known drug allergies.   FAMILY HISTORY:  Nothing contributory.   SOCIAL HISTORY:  The patient  lives with her sister.  Denies smoking  cigarettes, drinking alcohol or using illegal drugs.Marland Kitchen   REVIEW OF SYSTEMS:  As in history of present illness, otherwise nothing  else significant.   PHYSICAL EXAMINATION:  GENERAL:  The patient examined at bedside not in  acute distress.  VITAL SIGNS:  Blood pressure 130/90, pulse 60 per minute,  temperature  97.5, respirations 18 per minute,  O2 saturation  100%.  HEENT:  Anicteric.  No pallor.  CHEST:  Bilateral air entry present.  No rhonchi or crepitation.  HEART:  S1 and S2 heard.  ABDOMEN:  Soft, nontender.  Bowel sounds heard.  CNS:  The patient is alert, awake and oriented to time, place and  person.  Moves upper and lower extremities 5/5.  EXTREMITIES:  There is diffuse edema up to the knees bilaterally,  pitting.  Pulses felt.   LABORATORY DATA:  EKG normal sinus rhythm with nonspecific ST-T changes.  CBC:  WBC 9.7, hemoglobin 7.9, hematocrit 24.3, platelets 164,  neutrophils 65%.  Basic metabolic panel:  Sodium Q000111Q, potassium 4.2,  chloride 109, carbon dioxide 26, glucose was 49, now 76, BUN 19,  creatinine  1.9, calcium 8.5, troponin-I 0.03 and less than 0.05, BNP  517, myoglobin 240.  UA is showing moderate leukocytes, WBC 110, the  patient has no dysuria.   ASSESSMENT:  1. Decompensated congestive heart failure,  diastolic.  2. Hypoglycemia.  3. Diabetes mellitus type 2, presently corrected.  4. Hypertension.  5. Morbid obesity.  6. Severe anemia, chronic. Probably iron deficiency.  7. Morbid obesity.   PLAN:  1. Will admit the patient to  telemetry.  2. We will continue IV Lasix 80 mg IV q.8 h. until the patient's blood      sugars get more stabilized and the patient gets more compensated      with regard to her CHF.  3. Will check anemia panel and  stool for anemia.  4. Once patient's fluid status gets more  compensated at this time      would consider transfusing blood and the patient will definitely      need further workup on anemia at least as an outpatient.  5. At this time  will hold Lantus.  Place the patient on sliding scale      coverage until her blood sugar is more stable.  At that time will      restart Lantus.  6. Will get a 2-D echo and further recommendation as condition      evolves.      [name], MD  Electronically Signed     ANK/MEDQ  D:  09/09/2008  T:  09/09/2008  Job:  [id number]

## 2010-06-04 NOTE — Op Note (Signed)
[name], AMOAH NO.:  [id number]   MEDICAL RECORD NO.:  [id number]          PATIENT TYPE:  INP   LOCATION:  3307                         FACILITY:  [facility]   PHYSICIAN:  Kathrin Penner, M.D.   DATE OF BIRTH:  1945-03-08   DATE OF PROCEDURE:  06/19/2006  DATE OF DISCHARGE:                               OPERATIVE REPORT   PREOPERATIVE DIAGNOSIS:  Recurrent ventral hernia.   POSTOPERATIVE DIAGNOSIS:  Recurrent ventral hernia.   PROCEDURE:  Repair of ventral hernia with mesh.   SURGEON:  Kathrin Penner, M.D.   ASSISTANT:  Haywood Lasso, M.D.   ANESTHESIA:  General.   INDICATION:  The patient is a morbidly obese 65 year old female whose  original surgery was a hiatal hernia repair in the remote past.  She  subsequently developed a ventral hernia, which was repaired with  polypropylene mesh some time ago.  She now has a recurrent hernia  presenting primarily as an incarcerated mass to the left of the midline.  The patient has additional comorbidities of obesity, sleep apnea,  coronary artery disease, hypertension.  She is aware of the risks and  potential benefits of surgery and comes to the operating room having  understood these risks.   PROCEDURE:  Following the induction of satisfactory general anesthesia,  the patient was positioned supinely and the abdomen is routinely prepped  and draped to be included in the sterile operative field.  A midline  incision is carried down through the old scar.  Subcutaneous is taken  down to the region of the old repair.  It was noted that the midline  fascia was in fact intact, however, the mesh had apparently pulled apart  on both its lateral insertion points.  The midline bridge was taken down  and the hernia reduced.  An extensive adhesiolysis of 30 minutes was  carried out so as to remove both the hernia sac and adhesions to the  anterior abdominal wall.  The sac was removed and forwarded for  pathologic  evaluation.  The dissection was carried laterally, raising  skin flaps  to procure sufficient facial laxity and space for the hernia  repair.  On further evaluation, there were actually two large hernias,  one extending to the right and the other to the left of the midline.  After the flaps were raised, all the areas of dissection within the  abdomen were checked for hemostasis and noted to be dry.  The abdominal  cavity was thoroughly irrigated with aliquots of normal saline.  Sponge  and instrument counts were verified.   I then placed 8 sutures through and through the full thickness of the  abdominal wall superiorly, inferiorly and laterally.  The midline was  then closed with a running suture of #1 Novafil and then an onlay patch  of polypropylene mesh was measured and placed into the wound using the  through and through sutures to attach the mesh to the external fascia  superiorly, inferiorly and on right and left lateral sides.  I then used  an endotacker to attach the mesh to the anterior  fascia of the abdominal  wall using approximately 40 tacks.  The repair was noted to be excellent  and intact.  I then placed two Blake drains under the flaps for  drainage. The subcutaneous tissues then were closed over  the drains with a running suture of 2-0 Vicryl.  The skin was closed  with staples.  Sterile dressings were applied, the anesthetic reversed  and the patient removed from the operating room to the recovery room in  stable condition.  She tolerated the procedure well.      Kathrin Penner, M.D.  Electronically Signed     PB/MEDQ  D:  06/19/2006  T:  06/19/2006  Job:  [id number]

## 2010-06-04 NOTE — Discharge Summary (Signed)
[name], FIGGERS NO.:  [id number]   MEDICAL RECORD NO.:  [id number]          PATIENT TYPE:  INP   LOCATION:  [id number]                         FACILITY:  [facility]   PHYSICIAN:  [name], MDDATE OF BIRTH:  04-07-1945   DATE OF ADMISSION:  09/09/2008  DATE OF DISCHARGE:                               DISCHARGE SUMMARY   COURSE IN THE HOSPITAL:  A 65 year old female with known history of  diastolic failure, chronic kidney disease, diabetes mellitus type 2 and  hypertension was brought in after the patient was developing shortness  of breath for the last 3 days.  The patient on admission had a chest  which showed congestive heart failure pattern.  She was started on IV  Lasix and was managed initially on the step-down unit.  Cardiology and  nephrology consults were obtained.  The patient's Lasix was eventually  increased.  Initially ACE inhibitors were continued, but had to be held  because of increasing creatinine.  The patient's UA did show proteinuria  which was compatible with diabetic nephropathy.  The patient had a 2-D  echo during this stay which showed left ventricular cavity was normal,  EF 65-70%, wall motion was normal.   The patient did have left upper extremity swelling.  Doppler did show  negative for DVT.  As the patient's condition has improved  significantly, the patient was transferred to telemetry floor.  PT/OT  consults were obtained during this stay.  The patient also was found to  be anemic.  Two units of packed red blood cells were transfused.  At the  time of this dictation, her last hemoglobin/hematocrit was 9.4 and 28.9,  which had increased from 6.8 and 20.2.  The patient also was placed on  IV Venofer and Aranesp.  At this time, cardiologist and nephrologist's  opinion that the patient can be discharged home.  The patient does have  a follow-up with Dr. Florene Glen of Nephrology on September 19, 2008.  The  patient also is advised to follow  up with Dr. Willey Blade her  primary care physician within a week's time and recheck BMET and CBC for  which she has agreed.  The patient's home medications changed and added,  which will elaborated in the discharge medication list section.   PROCEDURES DONE DURING THIS STAY:  1. Radiological procedures:  Chest x-ray on September 09, 2008, showed      worsening pulmonary vascular congestion and interstitial edema,      stable cardiomegaly.  2. Chest x-ray on September 10, 2008, showed persistent cardiac      enlargement and pulmonary edema, but slightly improved aeration      since .  3. Sonogram renal on September 11, 2008, showed mildly echogenic kidneys,      consistent with chronic renal disease.  No hydronephrosis.  A 3-cm      cyst lower pole right kidney.  No significant parenchymal      abnormalities involving either kidney, incidental note of      cholelithiasis.  4. Doppler of the left upper extremity negative for DVT on  September 10, 2008.  5. Echocardiogram on September 11, 2008, showed an EF of 65-70%, normal      left ventricular cavity size and function, no wall motion      abnormality.   FINAL DIAGNOSES:  1. Decompensated congestive heart failure, diastolic, acute and      chronic.  2. Chronic kidney disease.  3. Anemia secondary to iron-deficiency and chronic kidney disease.  4. Diabetes mellitus type 2.  5. Hyperlipidemia.  6. Morbid obesity.  7. History of obstructive sleep apnea on continuous positive airway      pressure.  8. Chronic obstructive pulmonary disease on home O2.   MEDICATIONS AT DISCHARGE:  1. Metoprolol 50 mg p.o. b.i.d.  2. Lipitor 40 mg p.o. daily.  3. Ferrous sulfate 325 mg p.o. t.i.d.  4. Aspirin 81 mg p.o. daily.  5. Clonidine 0.2 mg b.i.d.  6. Potassium chloride 40 mg p.o. daily.  7. Lasix 80 mg p.o. b.i.d.  8. Hydralazine 10 mg p.o. t.i.d.  9. Imdur 30 mg p.o. daily.  10.NovoLog insulin sliding scale pre-meals t.i.d.; 151-200, two  units      subcutaneous, 201-250, four units subcutaneous, 251-300, six units      subcutaneous, 301-350, eight units subcutaneously, 351-400, ten      units subcutaneously.   PLAN:  The patient will be discharged home with home OT/PT.  Will follow  with Dr. Willey Blade within a week's time.  Recheck BMET and CBC at  that time.  To follow with Dr. Florene Glen, nephrology as scheduled on September 19, 2008.  To be on a cardiac healthy, carb modified diet.  The  patient's Lantus has been discontinued and the patient is on sliding  scale because initially hypoglycemia.  Eventually may need to be  restarted on Lantus based on her follow-up with the primary care  physician.  The patient is to be also on 1.5 liters of fluid restriction  per day.  The patient advised to be compliant with medications.  The  patient has home O2.      [name], MD  Electronically Signed     ANK/MEDQ  D:  09/14/2008  T:  09/14/2008  Job:  [id number]   cc:   Royetta Crochet. Karlton Lemon, M.D.  Darrold Span. Florene Glen, M.D.

## 2010-06-04 NOTE — Consult Note (Signed)
[name], [name] NO.:  [id number]   MEDICAL RECORD NO.:  [id number]          PATIENT TYPE:  INP   LOCATION:  3307                         FACILITY:  [facility]   PHYSICIAN:  Darrold Span. Florene Glen, M.D.  DATE OF BIRTH:  [birth year]   DATE OF CONSULTATION:  06/22/2006  DATE OF DISCHARGE:                                 CONSULTATION   NEPHROLOGY CONSULTATION:   HISTORY OF PRESENT ILLNESS:  I was asked by Dr. Bubba Camp to see this 65-  year-old female with a history of hypertension, diabetes and chronic  kidney disease with a creatinine of 1.8 mg/dl in May of 2005.  She is  status post ventral hernia repair Jun 19, 2006.  She was admitted with  an elevated creatinine and the creatinine has risen since admission.  Intraoperatively, the patient had relative hypotension with the  systolic blood pressure as high as [id number] and a systolic blood pressure as  low as 125 mmHg.  Serial creatinines are as follows:  May 27th - 2.26;  May 30th - 2.34, May 30th - 2.40; May 31st - 2.76; June 1st - 2.66.   MEDICATIONS:  Current medications include allopurinol, amitriptyline,  Norvasc, clonidine, heparin, Lasix, Neurontin, insulin, Sular, Protonix,  Actos, potassium and MiraLax.   PAST MEDICAL HISTORY:  Diabetes, hypertension, status post hysterectomy,  status post back surgery times three, history of gastric surgery for  peptic ulcer disease, obesity, gout, anemia, congestive heart failure  due to diastolic dysfunction, ventral hernia, status post hiatal hernia  repair, and a history of diabetic retinopathy, status post retinal laser  therapy.   PHYSICAL EXAMINATION:  VITAL SIGNS:  Blood pressure 140/44.  Heart rate  is 102.  Temperature of 99.2.  Oxygen saturation 95%.  GENERAL:  This is an awake and alert African-American female.  She is  morbidly obese.  She has obvious myoclonic jerks.  HEENT:  Her neck is wide and enlarged.  Neck veins are not visible.  LUNGS:  Decreased breath sounds  in both bases.  HEART:  Regular rate and rhythm.  ABDOMEN:  Obese, multiple staples inferior aspect of the abdomen midline  appear well.  EXTREMITIES:  1+ edema bilaterally.  NEUROLOGIC:  Positive asterixis.  She is oriented.   ASSESSMENT:  1. Chronic kidney disease with acute exacerbation.  The exact etiology      of the chronic kidney disease is most likely on the basis of      hypertension or diabetes and this will need to be evaluated.  2. Possible congestive heart failure.   RECOMMENDATIONS:  1. Optimize pulmonary status as you are doing with critical      care/pulmonary consultation.  2. Check urinalysis and recheck BMP.  Re-diurese p.r.n.  We will give      a dose of intravenous furosemide today.  3. Renal ultrasound before discharge once the patient's abdomen has      healed up and feels more comfortable.      Darrold Span. Florene Glen, M.D.  Electronically Signed     ACP/MEDQ  D:  06/22/2006  T:  06/22/2006  Job:  [id number] 

## 2010-06-07 NOTE — Discharge Summary (Signed)
[name], TOWSLEY NO.:  [id number]   MEDICAL RECORD NO.:  [id number]          PATIENT TYPE:  INP   LOCATION:  1418                         FACILITY:  [facility]   PHYSICIAN:  Cyril Mourning, D.O.    DATE OF BIRTH:  1945/12/29   DATE OF ADMISSION:  12/26/2004  DATE OF DISCHARGE:                                 DISCHARGE SUMMARY   For full details of the hospital course, please refer to the dictation by  Dr. Sherryl Manges, job [id number]; however, over the past couple of days,  events will be summarized here.   HOSPITAL COURSE:  Problem 1:  Ms. [facility] hospital course has been that of  continued progress and wait for skilled nursing facility bed.  Her active  issues at the time of this dictation include continued pain in her hands and  feet bilaterally, which she attributes to her previous episodes of gout that  presented in a similar constellation, by her account.  We are attempting  colchicine.  Problem 2:  Acute-on-chronic renal insufficiency:  This has improved at the  latter part of her hospitalization with a creatinine from 1.7 after  discontinuation of her angiotensin receptor blocker at recommendation to  refrain from ACE inhibitors or ARBs, subsequently due to her response in  regard to her renal function.  Problem 3:  Diabetes:  This remained stable.  Problem 4:  Obesity with obstructive sleep apnea, on BiPAP at night.  Problem 5:  Polymyalgia Rheumatica:  She is tapering on a steroid dose at  this time.   MEDICATIONS:  1.  Calcium carbonate 1 tablet p.o. daily.  2.  Allopurinol 100 mg daily.  3.  Aspirin 81 mg daily.  4.  Protonix 40 mg daily.  5.  Senokot daily with parameters to hold if she develops diarrhea.  6.  She is on a NovoLog sliding scale with q.h.s. CBGs.  7.  She is on Coreg 25 mg b.i.d.  8.  In addition, she is on prophylactic dosing of Lovenox here.  9.  She is on Ultram 50 mg p.o. q.i.d.  10. She is on a prednisone taper 20 mg daily  until the 17th of December,      then 10 mg daily until 20th.  To discontinue it after that.  11. She is on Lantus 40 units q.12h.  12. Neurontin 600 mg b.i.d.  13. Hydrochlorothiazide 25 mg daily.  14. Elavil 75 mg q.h.s.  15. Her Torsemide is held at this time due to her volume status.  Would      recommend resuming when clinically indicated.  16. Colace 1 p.o. b.i.d.  Hold if diarrhea.  17. Sular 20 mg daily.  18. Albuterol treatment q.8h.  19. Atrovent treatment q.8h.  20. Colchicine is dosed at 0.6 mg b.i.d.  21. Phenergan is given at 12.5 mg IV q.4h. p.r.n.  22. She is receiving Dilaudid 1-2 mg IV q.3h. p.r.n.  23. Labetolol IV p.r.n. for systolic over 0000000.   Ms. Durocher remains here awaiting nursing facility placement and at the time  of her discharge, these  medications will be adjusted at the discretion of  the discharging physician.  For full history, present illness, in addition  to the complete hospital course, please review the discharge summary.      Cyril Mourning, D.O.  Electronically Signed     ESS/MEDQ  D:  01/05/2005  T:  01/06/2005  Job:  [id number]

## 2010-06-07 NOTE — Discharge Summary (Signed)
[name], [name] NO.:  [id number]   MEDICAL RECORD NO.:  [id number]          PATIENT TYPE:  INP   LOCATION:  3025                         FACILITY:  [facility]   PHYSICIAN:  Rexene Alberts, M.D.    DATE OF BIRTH:  09-12-1945   DATE OF ADMISSION:  08/28/2004  DATE OF DISCHARGE:  09/13/2004                                 DISCHARGE SUMMARY   ADDENDUM   PLEASE SEE THE PREVIOUS DISCHARGE SUMMARY DICTATED BY Aquilla Hacker, M.D.   DISCHARGE DIAGNOSES:  1.  GENERALIZED WEAKNESS.  Etiology multifactorial, including polymyalgia      rheumatica exacerbation, degenerative joint disease, anemia, obesity,      and urinary tract infection. The patient is currently stable; however,      the major issue of the hospitalization has been her nonambulatory      status. The patient does have some generalized weakness and some      generalized muscle tenderness, primarily in her legs. The patient had      been restarted on prednisone at 10 mg daily a few days ago; however, the      patient's repeat sedimentation rate was still elevated at 140. Given the      persistent elevation in the sedimentation rate, prednisone was increased      to 30 mg b.i.d. The prednisone will need to be tapered slowly over the      next six months. The patient did receive physical therapy and      occupational therapy evaluations during the hospitalization. The      therapists agreed that the patient needed aggressive  therapy before      returning home. The patient has agreed to be discharged to [facility] for further rehabilitation. The rehab unit here at [facility] was consulted; however, they felt that the patient needed      longer rehabilitation in a skilled nursing facility.  2.  URINARY TRACT INFECTION SECONDARY TO PROTEUS.  The patient's urinalysis      was positive for bacteria and WBC. The urine culture eventually grew out      greater than 100,000 colonies of  Proteus. The patient was started on      treatment with ciprofloxacin. She is currently on day #6 of Cipro. She      will need one more day of Cipro therapy. She does have an indwelling      Foley catheter. The Foley catheter should be discontinued at the skilled      nursing facility once the patient becomes more ambulatory.  3.  NORMOCYTIC ANEMIA.  The patient was evaluated for anemia during the      hospital course. Iron studies revealed a ferritin of 1545, total iron of      37, TIBC of 197, percent saturation of 19, vitamin B12 of greater than      2000, rbc folate of 815, and reticulocyte count of 43.5. Her stools were      assessed for microscopic blood and were guaiac-negative x2.  A hemoglobin      electrophoresis was ordered and is now pending. The results will need to      be followed up by the patient's primary care physician, Dr. Karlton Lemon, or      the physician at the skilled nursing facility. Arrangements will be made      for the patient to be evaluated by gastroenterologist, Jeryl Columbia,      M.D., who was on-call for unassigned patients. The patient will need      further evaluation with hematology if the colonoscopy in the future is      essentially negative. The patient was transfused a total of three units      of packed red blood cells during the hospital course. Over the past 2-3      days, her hemoglobin has ranged between 9.2 and 9.3. The patient's      hemoglobin did fall as low as 7.8 during the hospital course.  4.  TYPE 2 DIABETES MELLITUS.  The patient's capillary blood sugars were      somewhat erratic during the last few days of hospitalization. The      patient had been treated with 70/30 insulin during the hospital course.      However, given that the patient's capillary blood sugars were erratic,      she was started on NPH and Novolog separately. The NPH insulin has been      titrated up to 60 units b.i.d. She is on a Novolog sliding-scale insulin       (resistant scale) before meals and q.h.s. The NPH and Novolog may need      to be titrated upward, given that the patient's prednisone dosing has      been increased.  5.  CHRONIC KIDNEY DISEASE.  The patient does have an element of chronic      kidney disease, probably secondary to diabetic nephropathy. Her BUN      ranges between 35-37 and her creatinine ranges between 1.1 and 1.3. The      patient was not treated with an ACE inhibitor during the hospital      course. Apparently, the Altace was inadvertently left off. The Altace      will be restarted at the time of hospital discharge.  6.  OBSTRUCTIVE SLEEP APNEA.  The patient was continued on CPAP therapy at      bedtime.   DISCHARGE MEDICATIONS:  1.  NPH insulin 60 units subcu b.i.d.  2. Sliding-scale insulin regimen with      a resistant scale Novolog before meals and q.h.s.  3. Protonix 40 mg      daily.  4. Altace 2.5 mg daily.  5. Calcium with vitamin D, once b.i.d.      6. Neurontin 600 mg b.i.d.  7. Coreg 25 mg daily.  8. Amitriptyline 75      mg q.h.s.  9. Aspirin 81 mg daily.  10. Colchicine 0.6 mg daily.  11.      Senokot-S, one tablet b.i.d.  12. Sular 40 mg daily.  13. Lactulose 30      mL daily.  14. Nu-Iron 150 mg daily.  15. Multivitamin with iron, once      daily.  16. Demadex 20 mg daily.  17. Prednisone 30 mg b.i.d., titrate      slowly over the next six months. Recheck sedimentation rate in 3-6      months.  18. Cipro 250 mg p.o. b.i.d. through  the end of 09/14/04.   DISCHARGE DISPOSITION:  The patient will be transferred to the [facility] on today. She is stable and in no acute distress.   DISCHARGE SPECIAL INSTRUCTIONS:  The patient will need to have her CBC and  BMET redrawn in approximately 5-7 days.   DISCHARGE FOLLOW-UP:  An outpatient appointment will be made with  gastroenterologist, Jeryl Columbia, M.D.; the skilled nursing facility will be informed of this appointment. The patient will  need to be evaluated via  colonoscopy for chronic anemia.   PENDING LABORATORIES AND STUDIES:  The hemoglobin electrophoresis laboratory  result is pending; this will need to be checked.   ADDENDUM:  I was informed by the pharmacy that the patient had a recorded allergy to  Altace or ACE-inhibitors. The patient was unaware of this. She does not  recall any episodes of angioedema. I will call Dr. Roland Earl office to  clarify. For now, Altace will not be started.   The patient has a scheduled appointment to see gastroenterologist, Dr.  Watt Climes, on October 22, 2004 at 11:00 a.m. for evaluation of anemia.      Rexene Alberts, M.D.  Electronically Signed     DF/MEDQ  D:  09/13/2004  T:  09/13/2004  Job:  [id number]   cc:   Royetta Crochet. Karlton Lemon, M.D.  [address]  [address] [place]  [place] [address]  Fax: [phone]

## 2010-06-07 NOTE — Discharge Summary (Signed)
NAMESHAVNA, GERRY NO.:  [id number]   MEDICAL RECORD NO.:  [id number]          PATIENT TYPE:  INP   LOCATION:  1418                         FACILITY:  [facility]   PHYSICIAN:  Edythe Lynn, M.D.       DATE OF BIRTH:  11-13-1945   DATE OF ADMISSION:  12/26/2004  DATE OF DISCHARGE:  01/07/2005                                 DISCHARGE SUMMARY   ADDENDUM:  For complete list of diagnoses, please refer to the previously  dictated Discharge Summary on December 17 by Dr. Jari Favre and on December 13  by Dr. Blenda Nicely.   The only change to the previously dictated Discharge Summary is that Ms.  Karges was discharged actually to home with home health physical therapy,  home health occupational therapy, home health pulmonary care.   She is to continue medications as she was taking in the hospital with the  mention that we have discontinued intravenous p.r.n. medications like  Dilaudid, Phenergan, and labetalol.  Otherwise, all her medications were to  be continued at home.   Of note per her primary care physician, Ms.  Ledwith was resumed on furosemide  and potassium chloride at the time of discharge home, and her basic  metabolic profile will need to be checked at the hospital followup visit.   At the time of discharge, Ms. Mohs was scheduled to see in followup her  primary care physician who is Dr. Willey Blade.  She was provided with  full instructions for her medications, and she was provided with  prescriptions for all her medications.   Also at the time of discharge, Ms. Robertshaw was continued on low-dose  prednisone at 10 mg daily, and this will be continued at the discretion of  her primary care physician, Dr. Karlton Lemon.      Edythe Lynn, M.D.  Electronically Signed     SL/MEDQ  D:  01/08/2005  T:  01/12/2005  Job:  [id number]   cc:   Royetta Crochet. Karlton Lemon, M.D.  Fax: [phone]

## 2010-06-07 NOTE — Consult Note (Signed)
[name]. [facility]  Patient:    [name]                         MRN: [id number] Proc. Date: 02/19/99 Adm. Date:  [id number] Attending:  Wynona Luna CC:         Arelia Longest. Michelle Nasuti., M.D.                          Consultation Report  REASON FOR CONSULTATION:  Chest pain and pulmonary edema.  IMPRESSIONS:  1. History of coronary artery disease, recent onset unstable angina.  2. New onset congestive heart failure secondary to above.  3. Diabetes mellitus, insulin requiring.  4. Obesity, marked.  5. Obstructive sleep apnea.  6. Hypertension.  7. Myocardial infarction currently ruled out.  RECOMMENDATIONS:  1. Echocardiogram has been ordered.  Will review.  2. Agree with Lovenox and aspirin.  3. Will need additional doses of Lasix.  Recommend 80 mg p.o. q.d. IV x 3.  4. Probably will need coronary angiography, possible percutaneous transluminal     coronary angioplasty  5. Recommend increase Altace to 5 mg p.o. q.12h.  FINDINGS:  [name] is a 65 year old woman with a previous history of AD, status post apparent PTCA in the 1980s.  She is unaware of any details associated with this that was done for chest pain.  She has been well until the last two months when she has noticed an increasing frequency of substernal chest pressure and heaviness "like someone stomping on my chest."  It would radiate to the left arm and be associated with shortness of breath and mild diaphoresis.  No nausea. She, in addition to this, has had progressive dyspnea such that two days prior o admission she experienced orthopnea/PND x 3 during the evening.  She finally presented to [facility] Emergency Room last night with these complaints, where initial CK-MB was negative and an EKG nonischemic.  A chest x-ray revealed cardiomegaly and mild pulmonary edema.  She has responded nicely to intravenous Lasix 100 mg IV ith a 4 L urine output.  She is  much less short of breath today and not experiencing any discomfort.  PAST MEDICAL HISTORY:  As above.  MEDICATIONS (outpatient):  1. Insulin 70/30, 100 units in a.m.; 100 units in p.m.  2. BiPAP used at night, unknown pressures.  3. Lasix 80 mg p.o. in a.m.; 80 mg p.o. in p.m.  4. Lotensin (?) dose.  5. Aspirin 325 mg p.o. q.d.  DRUG ALLERGIES:  None known.  SOCIAL HISTORY:  She lives in [place] alone.  She is divorced.  Has five children.  Does not use tobacco or ethanol products.  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  FAMILY HISTORY:  Not significant for early coronary disease.  REVIEW OF SYSTEMS:  She apparently did have an episode of CHF in 1990; back surgery in 1988; hiatal hernia repair in 1994; bladder surgery, 1996; and a complete hysterectomy in 1980.  She has right lower extremity weakness due to problems with her back.  She has no bleeding or bruising tendency.  No thyroid, adrenal, or pituitary problems.  No problems with her GI tract.  No problems with her urinary tract.  Musculoskeletal:  Problems are limited to her back.  She does wear glasses for the past 5 years.  No double vision nor blurred vision.  No skin  problems. No chronic pain other than mentioned above.  No difficulty sleeping on her BiPAP.  PHYSICAL EXAMINATION:  VITAL SIGNS:  Blood pressure currently is 140/80 with pulse of 80.  Respiratory  rate is 18, temperature 98.6.  Room air saturation is 95% on room air.  GENERAL:  She is a pleasant, cooperative and alert, 65 year old, obese black woman in no acute distress.  HEENT:  Unremarkable.  Pupils are equal, round, and reactive to light and accommodation.  Extraocular movements are intact.  Sclerae are nonicteric. Oral mucosa is pink and moist.  Dentition is intact.  NECK:  Supple without thyromegaly or masses.  Carotid upstrokes are normal. There is no bruit.  There is jugular venous distention.  CHEST:   Significant for mildly diminished breath sounds bilaterally.  Rales in oth bases, soft, adequate excursion.  HEART:  Regular rhythm.  Normal S1 and S2 is heard.  No murmur, click, or rub noted.  Heart sounds are muffled.  PMI is not palpable.  ABDOMEN:  Obese, soft and nontender.  Bowel sounds are present in all quadrants. No gross organomegaly.  RECTAL AND GENITAL:  Examinations deferred.  EXTREMITIES:  Chronic venostasis changes on the left and nonpitting edema, about a 1 to 2+ pitting edema on the right.  Pulses are diminished in both feet, intact in the femoral arteries.  NEUROLOGIC:  Cranial nerves II-XII, motor, and sensory are grossly intact. Gait is not tested.  SKIN:  Warm, dry, and clear.  ACCESSORY CLINICAL DATA:  Hemoglobin on admission is 10.5, hematocrit 33, white  blood cell count 7300, platelets 239,000.  Serum electrolytes are normal.  BUN 12, creatinine 0.6.  Arterial blood gas:  pO2 58 on room air, pCO2 47, pH 7.40.  Initial CK is 298 with an MB of 3.2.  Troponin I is 0.04.  Amylase 73.  Second CK-MB 238 with an MB fraction of 2.3.  Electrocardiogram on admission showed sinus rhythm, ST-T wave abnormality.  No clear ischemic changes.  No significant change on subsequent tracing done at 0430 this a.m.  A chest x-ray showed cardiomegaly and mild pulmonary edema.  COMMENTS:  This is a fairly classic history for crescendo angina pectoris in a 65 year old, obese, diabetic woman with known CAD that then progressed to congestive heart failure.  Another possibility in any patient with obstructive sleep apnea is chronic volume overload and diastolic dysfunction.  This is why  feel continued diuresis is important with her extremely low BUN and creatinine.  She probably has a fair amount of excess volume.  IV diuretics and p.o. diuretics should be continued until weight has stabilized and BUN and creatinine has started to rise.  We will review the  echocardiogram as noted and further recommendations including likely coronary angiography, possible PTCA are forthcoming.  Certainly surgery  given her diabetes and marked obesity would be very high risk. DD:  02/19/99 TD:  02/19/99 Job: [id number]

## 2010-06-07 NOTE — H&P (Signed)
NAMELASHUNA, [name] NO.:  [id number]   MEDICAL RECORD NO.:  [id number]          PATIENT TYPE:  INP   LOCATION:  [id number]                         FACILITY:  [facility]   PHYSICIAN:  Felicita Gage, MD     DATE OF BIRTH:  05-20-1945   DATE OF ADMISSION:  08/27/2004  DATE OF DISCHARGE:                                HISTORY & PHYSICAL   PRIMARY CARE PHYSICIAN:  Royetta Crochet. Karlton Lemon, M.D.   CHIEF COMPLAINT:  Hyperglycemia.   HISTORY OF THE PRESENT ILLNESS:  Ms. Rayon is a very pleasant morbidly obese  African-American woman, 65 years old and lives by herself who last night  fell out of bed.  There was no loss of consciousness; however, she could not  help herself back into bed, so she stayed on the floor.  In the A.M. her  grand-daughter came and she had her check her CBG, which was high.  She  called her primary care physician who referred her to the ED.  In the ED the  patient's blood sugar wad 688.  She was started on a glucomander and  received some IV fluids.   At the time of this exam the patient states she is feeling a little bit  better.  The patient has a history of CHF and obstructive sleep apnea.  She  is on continuous home oxygen.  She also has CPAP at home for sleeping.  The  patient denies any other complaints at the present time.  She states she is  compliant with her medications; however, does not remember the dosages of  the medications; the list was provided to the EMS but doses are not listed.   MEDICATIONS:  We have a list of medications without dosages and they are:  Nisoldipine, carvedilol, Elavil, ramipril, torsemide, Lasix, allopurinol,  calcium, Bayer aspirin, a stool softener, Prevacid, Hyzaar, gabapentin, and  colchicine.   ALLERGIES:  The patient is allergic to ACE INHIBITORS, which make her  hypotensive.  Please note that the patient is on an ACE INHIBITOR per the  list provided by the EMS.   PAST MEDICAL HISTORY:  The past medical history  is significant for:  1.  Diabetes mellitus.  2.  Hypertension.  3.  Obstructive sleep apnea.  4.  Anemia of chronic disease.  5.  Morbid obesity.  6.  Postherpetic neuralgia.  7.  Polymyalgia rheumatica.  8.  CHF.   FAMILY HISTORY:  The family history is noncontributory.   SOCIAL HISTORY:  The patient lives by herself.  Her grand-daughter helps her  out.  No alcohol, tobacco or drug use.   REVIEW OF SYSTEMS:  Greater than 10 systems reviewed.  Other than that  mentioned in the HPI she denies any other symptoms.   PHYSICAL EXAMINATION:  VITAL SIGNS: On physical exam on admission blood  pressure is 176/96, pulse 88, respirations 24 and temperature 100.3.  GENERAL APPEARANCE:  In general this is a pleasant, morbidly obese African-  American woman lying in bed in no acute distress, alert and oriented times  three, cooperative with the exam.  HEENT:  Normocephalic and atraumatic.  PERRL.  Sclerae anicteric.  Mucous  membranes moist.  NECK:  The neck is supple.  Difficult to assess JVD  because of body  habitus.  LUNGS:  The lungs have mild crackles at the right base.  Good air entry  bilaterally.  HEART:  Cardiovascular system; S1 and S2.  Regular rate and rhythm.  No  murmurs, rubs or gallops.  ABDOMEN:  The abdomen is soft.  Positive bowel sounds.  No tenderness.  No  masses.  Morbidly obese.  EXTREMITIES:  No cyanosis, clubbing or edema.  NEUROLOGIC EXAMINATION:  Cranial nerves II-XII are grossly intact.  Sensory  and motor within normal limits.  No focal deficits seen.   LABORATORY DATA:  Labs on admission:  Sodium 130, potassium 3.9, chloride  96, BUN 41, and glucose 668.  ABGs; pH 7.41, pCO2 40, pO2 70, bicarb 26.7,  and O2 sat is 94%.  WBCs 10.2, hemoglobin 8.6. hematocrit 25.6 and platelets  214,000.  BNP (b-natruretic peptide) 52.7.  Urinalysis abdomen chest x-ray  are pending at the time of this dictation.   ASSESSMENT AND PLAN:  [age]-year-old African-American woman  with insulin-  requiring diabetes mellitus presenting with nonketotic hyperglycemia.   1.  Nonketotic hyperglycemia.   The patient was started on intravenous fluids and a glucomander in the  emergency department.  We will discontinue the glucomander when the blood  sugar is at 250 and we will continue to give her intravenous fluids at 100  mL/hour and start sliding scale when the blood sugars are less than 250.  Arterial blood gases show no acidosis.  Urinalysis is still pending.  The  bicarbonate is within normal limits.   1.  Hypertension.  The patient's blood pressure is 176/96.   We have resumed her Cozaar per doses from the last discharge summary.  Since  the patient's doses of medications are not known her medications are started  based on a discharge summary of December 2005 with the medications that were  listed at that time.  Accordingly, she has been started on Cozaar at 25 mg  p.o. daily.  We will also give her on hydralazine 10 mg intravenously q.6  hours p.r.n. for systolic blood pressure greater than 180.  We will contact  Dr. Helyn App office later today and get the patient's list of  medications with the correct dosages.   1.  Anemia of chronic disease.  The patient's hemoglobin and hematocrit are 8.6 and 25.6 respectively.   We will continue to monitor her hemoglobin and hematocrit, and transfuse as  needed.   1.  Obstructive sleep apnea.   The patient's family will bring her CPAP machine from home.  She is on  oxygen by nasal cannula with saturations greater than 92%.   1.  Polymyalgia rheumatica.   We have started her on her home dose of gabapentin.   1.  Gout.   We have started her on colchicine.   1.  Congestive heart failure.   The patient's medication list states that she is on Lasix and torsemide, doses are not known; however, at the present time in light of her nonketotic  hyperglycemia we will hold these until we get the correct doses  and her  nonketotic hyperglycemia has resolved.   DISPOSITION:  1.  PT and OT consult and cast management consult have been requested for      possible home health needs since the patient lives by herself.  2.  After resolution of her acute symptoms the patient will be discharged      home.       GDK/MEDQ  D:  08/28/2004  T:  08/28/2004  Job:  [id number]   cc:   Royetta Crochet. Karlton Lemon, M.D.  [address]  [address] [place]  [place] [address]  Fax: [phone]

## 2010-06-07 NOTE — Discharge Summary (Signed)
NAMEGIULIETTE, [name] NO.:  [id number]   MEDICAL RECORD NO.:  [id number]                   PATIENT TYPE:  INP   LOCATION:  [id number]                                 FACILITY:  [facility]   PHYSICIAN:  Manus Rudd, MD                    DATE OF BIRTH:  1945-03-28   DATE OF ADMISSION:  06/03/2003  DATE OF DISCHARGE:  06/06/2003                                 DISCHARGE SUMMARY   PRIMARY CARE PHYSICIAN:  Dr. Jarrett, [facility].   CONSULTING PHYSICIAN:  [facility].   PRINCIPAL PROCEDURES:  1. Open MRI of the head that showed white matter changes and no evidence of     stroke.  2. Echocardiogram which showed an EF of 55-65% with no wall motion     abnormalities and left ventricle wall thickness was mildly increased, and     mild mitral annular calcification.   FINAL DIAGNOSES:  1. Syncopal episode, noncardiac in origin.  2. Congestive heart failure, nonischemic.  3. Hypertension.  4. Diabetes mellitus.  5. Chronic renal insufficiency.  6. Gout.  7. Obstructive sleep apnea.  8. Allergic rhinitis.   ADMISSION HISTORY AND PHYSICAL:  Please see chart for full details, but in  short, [name] is a 65 year old obese African American female who  was doing the dishes on the day of admission when she felt dizzy and fell  over, slumping in her chair, then does not remember anything.  The family  believes that she was out for approximately 30 minutes.  When the family  found her, she was completely unresponsive with a strong pulse and eyes wide  open.  She at approximately once that day, around noon, and believes that  she fell at approximately 3 p.m.  At noon, she ate sausage, apple, water and  bread.  She complained of shortness of breath for approximately two weeks  prior to the date of this event.  On arrival to the ED, her blood sugar was  134.  The patient was unresponsive, with her eyes open.  She moved her head  from side to  side in response to ammonia salts.  However, she did move her  limbs.  Then within minutes, the patient became alert, responsive, and could  move all of her extremities and had no memory of the event.  She had no loss  of bowel or bladder control throughout this episode.   DIAGNOSIS:  Cardiac enzymes negative x3.  Alcohol level less than 5.  Urine  drug screen negative.  Chest x-ray negative.  TSH 1.510, hemoglobin A1c 7.0.  WBC 5.8, hemoglobin 9.6, hematocrit 21.8, platelets 184,000.  Sodium 141,  potassium 3.7, chloride 105, cO2 28, glucose 140, BUN 59, creatinine 1.7.   HOSPITAL COURSE:  1. Syncope.  Given the patient's episode of syncope, she was evaluated on     several levels.  Number one, from a cardiac standpoint.  Cardiac enzymes     were followed and were negative x3.  She was monitored on telemetry     throughout her hospitalization and had no events.  Cardiac consultation     was obtained and echocardiogram was obtained which showed results as     above.  Cardiology did not feel that the syncopal episode was cardiac in     nature, and felt no further workup from a cardiac standpoint was     necessary.  The episode was not consistent with a seizure episode.     Therefore, no further seizure workup was performed. From a neurological     standpoint, the patient had mild changes on her EKG, i.e. widening QT,     and had decreased sensation on the right side of her face.  This was     concerning for possible TIA versus stroke.  Therefore, a MRI/ MRA were     attempted to be performed.  However, due to the patient's body habitus,     this was not able to be performed, and the patient was sent off campus     for an open MRA which showed no white matter changes and no evidence of     stroke.  Orthostatics were performed throughout her hospitalization, and     were negative.  Her blood sugars were monitored throughout her     hospitalization and were normal.  TSH was evaluated and was  normal at     1.510.  Given the patient's sudden syncopal episode, also concerned about     possible PE.  The patient had a normal D-Dimer of 0.37 and therefore it     was felt PE was unlikely.  It is likely that her syncopal episode may     have been secondary to vertigo or other neurological causes which can be     further worked up as an outpatient.  She will return to her primary care     physician and see Dr. Jacki Cones on July 04, 2003, at 1:30 p.m.  2. Congestive heart failure, nonischemic.  This was stable throughout her     hospitalization.  She was maintained on her Lasix and required non of her     p.r.n. Zaroxolyn throughout her hospital course.  Her echocardiogram     showed an ejection fraction of 55 to 65%.  Further results as above.  3. Hypertension.  The patient was maintained on her home medications of     Cozaar and Coreg with no problems throughout her hospitalization.  4. Sleep apnea.  The patient was maintained throughout hospitalization with     her CPAP. Of note, the patient has been having problems with her CPAP     machine at home.  Care management became involved and has helped the     patient resolve the problems with her CPAP machine at home.  5. Chronic renal insufficiency.  Her creatinine was stable throughout this     admission.  6. Gout.  Stable throughout hospitalization.  7. Diabetes mellitus.  Her hemoglobin A1c was 7.0, and she was maintained on     her home regimen of 110 units of 70/30 b.i.d., and had fairly good     control throughout her hospitalization.   DISCHARGE MEDICATIONS:  1. Lasix 40 mg, one tablet p.o. b.i.d.  2. Aspirin 325, one tablet p.o. daily.  3. Cozaar 50 mg, one tablet p.o. daily.  4. Allopurinol 100 mg one tablet p.o. daily.  5. Allegra one tablet p.o. daily.  6. Percocet 5/ 325, one to two tablets p.o. q.4h. p.r.n. pain.  7. Coreg 6.25 mg one tablet b.i.d.  8. Colchicine p.r.n.  9. Zaroxolyn 5 mg p.r.n. swelling.   DISCHARGE  INSTRUCTIONS:  Instructions to patient and family.  1. The patient was instructed to follow a diabetic and heart-health diet.  2. She was instructed to follow up with Dr. Jacki Cones at 07/04/03 at 1:30 p.m.  3. She was instructed to follow up with [facility], and to call [phone] to schedule that appointment.  4. DNR Status:  Full code.                                                Manus Rudd, MD   SJ/MEDQ  D:  06/07/2003  T:  06/08/2003  Job:  [id number]   cc:   Jacki Cones, Dr.  [facility]

## 2010-06-07 NOTE — Discharge Summary (Signed)
[name], [name] NO.:  [id number]   MEDICAL RECORD NO.:  [id number]          PATIENT TYPE:  INP   LOCATION:  3025                         FACILITY:  [facility]   PHYSICIAN:  Aquilla Hacker, M.D. DATE OF BIRTH:  February 09, 1945   DATE OF ADMISSION:  08/27/2004  DATE OF DISCHARGE:                                 DISCHARGE SUMMARY   INTERIM DISCHARGE DICTATION:  Dictation to outline the events that have  occurred between the dates of August 15 to September 08, 2004.  Please see Dr.  Jerolyn Shin dictation for events that took place prior to that.  As of  today, the patient's final diagnoses are the following.  1.  Polymyalgia rheumatica exacerbation.  2.  Acute renal insufficiency.  3.  Anemia of chronic disease.  4.  Severe deconditioning resulting in the patient's inability to ambulate.  5.  Congestive heart failure.  6.  Obstructive sleep apnea.  7.  Uncontrolled hypertension.  8.  Nonketotic hyperglycemia.  9.  Bilateral leg pain.  10. Constipation.  11. Urinary tract infection.  12. Hypoglycemia.  13. Leukocytosis.   PROCEDURES:  1.  Head CT without contrast.  2.  Venous Doppler of both legs.  3.  Transfusion of 1 units of packed red blood cells.   #1.  POLYMYALGIA RHEUMATICA:  As noted in Dr. Jerolyn Shin dictation, the  patient did complain of pain over the course of hospitalization, and this  was believed to be secondary to a flareup of polymyalgia rheumatica.  Dr.  Gwynneth Aliment has started the patient on prednisone.  I actually decreased the  dose to 10 mg once a day which is what the patient is currently taking.  Over the course of the past 6 days, the patient's pain has gradually  decreased, although it has not completely resolved.  Therefore, will  continue to monitor and try to completely taper off steroids over the next  couple of days.   #2.  ANEMIA OF CHRONIC DISEASE:  It was noted on August 15 that the  patient's hemoglobin had declined to  7.8.  The decision, therefore, was made  to transfuse the patient 1 unit of packed red blood cells.  Her hemoglobin  was monitored subsequent to this transfusion, and it has remained within the  8 range over the past few days.  The patient has not displayed any obvious  signs of bleeding over the past several days.  Therefore,  I simply  monitored her hemoglobin and hematocrit from that point forward.   #3.  CONGESTIVE HEART FAILURE:  The patient's I's and O's were monitored  very closely over the course of her hospitalization, and over the last 2 to  3 days specifically, she was noted to have good urine output secondary to  being given low doses of Lasix, and as a result the patient's swelling in  her lower extremities has decreased significantly over the past 2 to 3 days.  Likewise, complaints of pain in her legs have also decreased, although has  not completely resolved.   #4.  OBSTRUCTIVE SLEEP APNEA:  The patient  has been provided CPAP over the  course of the night, and there have been minimal no complaints of shortness  of breath over the past 6 days.   #5.  HYPERTENSION:  The patient's blood pressure has not been optimally  controlled over the course of her hospitalization.  Several of her  antihypertensive medications were stopped at the initial admission into the  hospital.  I have titrated the dose of Sular up over the past several days;  however, I did not restart the patient's ARB.  This may be restarted over  the next couple of days or the patient will have to follow up with primary  care physician for restarting this medication.   #6.  BILATERAL LEG PAIN:  Because the patient complained of pain, and she  demonstrated significant swelling, I decided to order venous studies of her  lower extremities to rule out DVT.  These were completed on August 18, and  the final impression was no obvious evidence of DVT, SVT, or Mangen's cyst  bilaterally.  It was noted they were unable  to visualize both left and right  profunda veins secondary to body habitus.   #7.  ABDOMINAL PAIN:  On August 18, the patient did complain of some  nonspecific abdominal pain, and as a result of this, I ordered an abdominal  x-ray which revealed a nonobstructive bowel gas pattern.  Since August 18;  that is over the past two days, the patient's complaint with regards to her  abdomen has declined, and the patient has actually stated that she feels  much better.   #8.  CONSTIPATION:  The patient stated that she had not had a bowel movement  for several days.  Therefore, lactulose was provided to the patient.  As a  result, the patient did report that her bowels had begun to move, although  she produced small amounts of stool.  She says because of the bowel  movements, she began to feel better.   #9.  INABILITY TO WALK:  I have spoken with the patient's daughter at least  twice this week.  Her name is [name], and her phone number is [phone].  [name] relates to me that the patient has had multiple  episodes in the past whereby if she is hospitalized for extended periods of  time or if she travels large amounts of distances during which time she is  sitting for extended periods of time, then she has at each of those  instances developed an inability to walk.  She states she has had at least 3  similar episodes in the past, the last one being towards the end of last  year, 2005.  Each of those episodes required 1 to 2 weeks of rehabilitation,  and the last time the patient actually was treated in the SACU here at [facility].  She states that after 1 to 2 weeks of therapy, the patient  usually regains her ability to ambulate.  In addition, because I was  concerned about the patient stating that she suddenly lost ability to  ambulate, I ordered CT scan of the head to make sure there was nothing acutely going on; i.e., no acute bleed, and the CT scan of the head  was  completed on September 04, 2004, the final impression of which was no evidence  of acute intracranial abnormality.  Physical therapy and occupational  therapy have both been consulted for further evaluation of the patient.  The  patient more than likely will require inpatient rehabilitation for a short  period before she is completely back to her baseline.   #10.  URINARY TRACT INFECTION:  On August 19, the patient's urine was noted  to cloudier then typical.  Likewise, she was also noted to have developed  leukocytosis.  Therefore, I ordered a UA with microscopy, and this  demonstrated that the patient did in fact have a urinary tract infection.  Therefore, today, August 20, I decided to start the patient on ciprofloxacin  IV for treatment.   #11.  HYPOGLYCEMIA:  On at least 2 occasions, I have received calls in the  morning from the nurses indicating that the patient's sugars were slightly  within the low-normal range, therefore necessitating her a.m. dosage of  insulin be decreased to half of what it was when I first started.  When I  first started, the patient was taking 100 units of 70/30 in the morning, and  I subsequently decreased that dose from 100 to 50.  Over the past day or so,  the patient's sugars have been stable, but again, further optimization of  the patient's sugars will have to take place at her primary care physician's  office.   #12.  ACUTE RENAL FAILURE:  This was noted at the patient's initial  presentation into the hospital.  The patient's creatinine has been stable  over the course of the past couple of days.  It actually declined to a low  of 1.1 by August 15.  As of today, however, it has increased up to 1.4.  I  am not sure whether or not this is secondary to the Lasix that I have been  giving the patient, although I have been giving her low dosages of the IV  Lasix.  This will need to be monitored closely over the next several days  and even closer once  the patient is discharged to her primary care  physician's office.   #13.  LEUKOCYTOSIS:  Again, more than likely this is related to the urinary  tract infection that the patient has demonstrated, and IV antibiotics have  been initiated.   Any events that take place after this will be dictated by Dr. Rexene Alberts.  Issues that still remain are:  The patient's inability to ambulated.  As requested by the patient's  daughter, [name], PT and OT have been consulted, and case  management has also been consulted.  It is more than likely the patient will  need to have an extended stay within the SACU unit here at [facility] or at some other facility that is able to provide her with  aggressive physical therapy prior to being discharged home. Any events that  take place after this will be dictated by Dr. Rexene Alberts.      Aquilla Hacker, M.D.  Electronically Signed    OR/MEDQ  D:  09/08/2004  T:  09/08/2004  Job:  [id number]

## 2010-06-07 NOTE — Consult Note (Signed)
NAMEARRIETTY, [name]               ACCOUNT NO.:  [id number]   MEDICAL RECORD NO.:  [id number]          PATIENT TYPE:  INP   LOCATION:  0007                         FACILITY:  [facility]   PHYSICIAN:  Mobolaji B. Bakare, M.D.DATE OF BIRTH:  [birth year]   DATE OF CONSULTATION:  08/07/2005  DATE OF DISCHARGE:                                   CONSULTATION   REFERRING PHYSICIAN:  Dr. Sydnee Cabal.   REASON FOR CONSULTATION:  Postoperative management of medical problems.   HISTORY OF PRESENTING COMPLAINT:  [name] is a 65 year old African-  American female with end-stage left knee osteoarthritis. She underwent left  total knee replacement today. She currently has no complaints. Of note is  that she had a H&H done this morning which showed hemoglobin of 9.5 and  hematocrit of 29 prior to surgery. She was transfused with 1 units of packed  red blood cells.   The patient underwent Cardiolite stress test as outpatient, and it was  positive, and she cardiac catheterization which was done on April 11, 2005.  It was normal. Hence, the nuclear stress test was deemed to be false  positive. The patient was cleared for surgery by Dr. Doylene Canard.   REVIEW OF SYSTEMS:  There is no fevers, chills, headaches, chest pain,  shortness of breath, cough, abdominal pain, diarrhea, constipation. Review  of systems is essentially unremarkable.   PAST MEDICAL HISTORY:  1.  Type 2 diabetes mellitus.  2.  Myocardial infarction.  3.  Chronic anemia.  4.  Congestive heart failure.  5.  Gout.  6.  Obstructive sleep apnea.  7.  Polymyalgia rheumatica.  8.  Chronic kidney disease with baseline creatinine of 1.3.  9.  Hypertension.  10. Obesity.  11. Gastroesophageal reflux disorder.  12. Osteoarthritis, left knee.   PAST SURGICAL HISTORY:  1.  Hiatal hernia repair.  2.  Hysterectomy.   MEDICATIONS:  1.  Actos 45 mg q.a.m.  2.  Lantus 40 units subcu q.a.m., 40 units subcu q.p.m.  3.  Propoxyphene and APAP  100/650 one p.o. q.4h. p.r.n.  4.  Hyzaar 50/12.5 one p.o. q.a.m.  5.  Prednisone 5 mg p.o. daily.  6.  Multivitamin.  7.  NitroQuick p.r.n.  8.  Colchicine 0.6 mg q.a.m.  9.  Sular 10 mg p.o. daily.  10. Gabapentin 600 mg p.o. t.i.d.  11. Amlodipine 10 mg q.a.m.  12. Coreg 25 mg p.o. b.i.d.  13. Potassium chloride 10 mEq daily.  14. Prevacid 30 mg p.r.n.  15. Torsemide 20 mg b.i.d.  16. Fosamax 40 mg p.o. daily.  17. Allopurinol 300 mg p.o. daily.  18. Clonidine 0.1 mg every night  19. Soft softener every other day.   That is home medications.   Current medications in the hospital are:  1.  Cefazolin 1 g IV q.8h.  2.  Colace 100 mg p.o. b.i.d.  3.  Ferrous sulfate 325 mg p.o. t.i.d.  4.  Senokot 1 tablet p.o. b.i.d.  5.  Normal saline infusion.  6.  Warfarin as per pharmacy protocol.   ALLERGIES:  No known drug allergies.  SOCIAL HISTORY:  The patient does not drink alcohol. She quit smoking  several years ago. She has been limited in her activities secondary to the  left knee osteoarthritis.   FAMILY HISTORY:  He has family history of diabetes mellitus, hypertension.   PHYSICAL EXAMINATION:  CURRENT VITAL SIGNS:  Temperature 97.1, pulse 63,  respiratory rate 20, blood pressure of 165/86. Finger-stick glucose 203.  Oxygen saturation is 98% on 2 liters.  GENERAL:  On examination, the patient is not in respiratory distress. She is  obese.  HEENT:  Normocephalic, atraumatic. Pupils are equal, round, and reactive to  light.  NECK:  No elevated JVD. No thyromegaly. No discernible carotid bruits.  LUNGS:  Clear clinically to auscultation.  CARDIOVASCULAR:  S1 and S2. Regular. No murmur. No gallop.  ABDOMEN:  Nondistended. Soft. Nontender. Bowel sounds present.  EXTREMITIES:  No pedal edema. Both lower extremities are in SCD, left knee  is in surgical dressing.  CENTRAL NERVOUS SYSTEM:  No focal deficits.   LABORATORY DATA:  Sodium 142, potassium 3.7, chloride 105,  bicarb 28,  glucose 188. BUN 36, creatinine 1.6, calcium 9.3. Hemoglobin 9.5, hematocrit  29.0. BUN/creatinine done on July 13 was 40/1.7.   There is no recent EKG in the chart.   ASSESSMENT:  [name] is a 65 year old African-American female with  multiple medical problems as listed above. These medical problems appear  stable at this time. However, blood glucose is uncontrolled. In addition,  she had significant anemia prior to surgery.   RECOMMENDATIONS:  1.  Restart previous home medications.  2.  Will resume Lantus, given 20 units subcutaneous tonight (the patient has      been n.p.o. during the day). Then resume full compliment of home dose of      Lantus and Actos in a.m. as finger-stick glucose dictates. Check      hemoglobin A1c.  3.  Start CPAP tonight as per respiratory therapist.  4.  Check stool Hemoccult. Anemia panel on blood draw prior to blood      transfusion. Check BNP. Will need to follow and monitor BMET. She should      continue with her Lasix. Most recent chest x-ray done on July 9 showed      mild interstitial edema (2-D echocardiogram done in December of 2006      showed ejection fraction of 55-65%). She does not appear to be in      clinical CHF at this point. If CHF should be a problem, consideration      will be given to discontinuing Actos.  5.  The patient has been chronic steroids. Will increase prednisone to 7.5      mg daily on p.r.n. basis perioperative period, and this may be reduced      to 5 mg daily (home dose) prior to discharge.      Mobolaji B. Maia Petties, M.D.  Electronically Signed     MBB/MEDQ  D:  08/07/2005  T:  08/07/2005  Job:  [id number]   cc:   Royetta Crochet. Karlton Lemon, M.D.  Fax: [phone]

## 2010-06-07 NOTE — Discharge Summary (Signed)
[place]. [facility]  Patient:    [name]                         MRN: [id number] Adm. Date:  [id number] Disc. Date: [id number] Attending:  Wynona Luna Dictator:   Beckey Downing CC:         Hyman Bible, M.D.                           Discharge Summary  DATE OF BIRTH:  [birth year]  PRIMARY PHYSICIAN:  Hyman Bible, M.D.  DISCHARGE DIAGNOSES: 1.  Congestive heart failure exacerbation. 2.  Diabetes mellitus, type II. 3.  Obesity. 4.  Obstructive sleep apnea. 5.  Pulmonary hypertension. 6.  Hypertension. 7.  Normocytic anemia. 8.  Sacroiliitis.  DISCHARGE MEDICATIONS: 1.  Lasix 80 mg p.o. b.i.d. 2.  Atenolol 50 mg p.o. q.d. 3.  Insulin 70/30 100 units b.i.d. 4.  Enteric-coated aspirin 325 mg p.o. q.d. 5.  Altace 5 mg p.o. q.d. 6.  Vioxx 25 mg p.o. q.d.  CONSULTATIONS: 1.  Cardiology (Dr. Leonia Reeves).  PROCEDURES: 1.  Cardiac catheterization, which revealed no significant coronary artery disease,     normal LV systolic function with an EF of 65%, mild to moderate pulmonary     hypertension, pulmonary artery pressure is 54/27, and mild increased LVEDP f     22. 2.  A 2-D echocardiogram. 3.  Bilateral lower extremity venous Dopplers, which revealed no evidence of DVT.  HISTORY OF PRESENT ILLNESS:  The patient is a 64 year old woman with a past medical history of diabetes mellitus, type II, and CHF, who presented with a two day history of severe shortness of breath, three pillow orthopnea, cough productive of white and yellow sputum, as well as some transient chest pain.  She was founds o be saturating 91% on room air and talked in short sentences.  Chest x-ray revealed edema and she was admitted for likely CHF exacerbation.  HOSPITAL COURSE: #1 - CONGESTIVE HEART FAILURE EXACERBATION:  The patient was admitted to telemetry and aggressively diuresed with Lasix and given oxygen as-needed.  MI ruled out y enzymes x  three.  TSH was normal.  Cardiology was consulted and performed a 2-D  echocardiogram and a cardiac catheterization (results as above).  The patient lost 13 pounds with aggressive diuresis, negative approximately 10 liters at the time of discharge.  CHF exacerbation felt to be secondary to pulmonary hypertension and  secondary to obstructive sleep apnea.  #2 - CHEST PAIN:  The patient ruled out for MI and had a clean cardiac catheterization.  She was sent home on aspirin.  #3 - DIABETES MELLITUS, TYPE II:  She was started on an ADA diet and CBGs were ell controlled in the low to mid 100 range on Humulin 70/30 75 units b.i.d.  The patient will be sent home on her prior dose of insulin 70/30 100 units b.i.d. hen it becomes available Avandia can be added to this regimen.  Discussed at length  with the patient diet, exercise, and importance of compliance.  #4 - OBSTRUCTIVE SLEEP APNEA:  The patient reports that she has BiPAP at home, often not using it until she falls asleep at 2 or 3 a.m. and then occasionally having it fall off.  She reports that she tolerates BiPAP reasonably well. Stressed to her the importance of using her BiPAP for at least eight hours  a night as this is absolutely crucial to management of her pulmonary hypertension.  #5 - HYPERTENSION:  The patient was on Lopressor while in the hospital as well s Altace.  To be continued on Atenolol 50 mg and Altace 5 mg as an outpatient.  #6 - SACROILIITIS:  The patient complained of left lower extremity pain as well as left hip pain.  Lower extremity Dopplers were negative.  Patient point tender left sacroiliac region.  Had negative straight leg raise test bilaterally and a good  AROM of her back and flexion extension and lateral rotation.  The patient was able to ambulate with mild discomfort but did better after she had been walking for  few steps.  It was felt her exam was consistent with sacroiliitis and to  treat ave given samples of Vioxx.  Further management per Dr. Vevelyn Royals on an outpatient basis.  #7 - NORMOCYTIC ANEMIA:  The patients hemoglobin was 10.5 on admission with an CV of 86 and RDW of 16.  Work-up included a ferritin which was normal at 214 and a  B-12 which was normal range at 1474.  Folate was pending at the time of discharge.  #8 - COMPLIANCE/FINANCIAL SITUATION:  The patient is having difficulty paying for her medications as she has not been able to qualify for Medicaid because she co-signed on her son and granddaughters car loans.  This was addressed with social work and, unfortunately, in spite of her income of approximately $600 per month she is unable to qualify for any assistance from [facility] or Medicaid at  this time.  We sent her home on a seven day supply of medication, which should et her through to her next appointment, and further efforts to obtain some assistance for her will be continued at the outpatient clinic.  The patients current regimen includes relatively inexpensive medications of Lasix, Atenolol, insulin, aspirin, and it is hoped that she can obtain samples of Altace and Vioxx from the outpatient clinic.  As funding becomes available other medications such as Zaroxolyn and Avandia may be added as seen fit by Dr. Vevelyn Royals.  DISCHARGE LABORATORY DATA:  TSH 4.54.  Cardiac enzymes negative x three. Hemoglobin A1C 9.4.  Ferritin 214.  Vitamin B-12 1474.  Total cholesterol 149, triglyceride 154, HDL 51, LDL 67.  DISCHARGE CONDITION:  Stable and improved.  Patient saturating 96 to 98% on rest in room air, saturating down to 94% with ambulation.  The patient is able to ambulate down the hall with minimal shortness of breath.  Lung exam was clear.  She thought she was near her baseline and ready to return home.  DISPOSITION:  The patient is to follow up with Dr. Vevelyn Royals at the outpatient clinic on February 27, 1999 at 3 p.m. DD:   02/22/99 TD:  02/23/99 Job: [id number]

## 2010-06-07 NOTE — Discharge Summary (Signed)
[name], [name] NO.:  [id number]   MEDICAL RECORD NO.:  [id number]                   PATIENT TYPE:  INP   LOCATION:  [id number]                                 FACILITY:  [facility]   PHYSICIAN:  Thressa Sheller, M.D.               DATE OF BIRTH:  Aug 29, 1945   DATE OF ADMISSION:  12/26/2001  DATE OF DISCHARGE:  12/31/2001                                 DISCHARGE SUMMARY   DISCHARGE DIAGNOSES:  1. Congestive heart failure exacerbation.  2. New onset gout.  3. Nonischemic cardiomyopathy with ejection fraction of 35-45% based on     catheterization in January of 2003 and echocardiogram January 2003.  4. Normocytic cause unknown.  5. Diabetes mellitus type 2.  6. Hypertension.  7. Obstructive sleep apnea causing slightly elevated right heart pressures.  8. Mild right heart failure.  9. Chronic renal insufficiency thought secondary to diabetes and     hypertension.   DISCHARGE MEDICATIONS:  1. Lasix 120 mg b.i.d.  2. Aspirin 325 mg q.d.  3. K-Dur 20 mEq q.d.  4. Insulin 70/30 with 100 units b.i.d.  5. Metformin 1 gm q.d.  6. Imdur 30 mg b.i.d.  7. Cozaar 25 mg q.d.  8. Atenolol 50 mg q.d.  9. Colchicine 0.6 mg q.d.   DISPOSITION:  The patient will return home.   DISCHARGE INSTRUCTIONS:  She is to follow up with myself or Dr. Sharlet Salina in  one week.  She is to follow up with Dr. Jacki Cones next month.  At the time of  follow-up, her lungs will be auscultated and her legs will be checked for  edema.  No follow-up laboratory should be necessary.   HISTORY OF PRESENT ILLNESS:  The patient is a 65 year old African American  female with a history of congestive heart failure who presented with three  weeks progressively worsening shortness of breath.  She reported gaining 20-  30 pounds over the last month and has had increased orthopnea and PND.  The  patient says she has been taking all of the medications but she been eating  poorly including ham and  barbecue.  She denies any chest pain but does  complain of some chest tightness.  She also complains of increasing lower  extremity edema.   PHYSICAL EXAMINATION:  VITAL SIGNS:  Temperature was 97.8, blood pressure  155/80, pulse 75, respirations 24.  She is saturating 92% on room air.  GENERAL:  She is a pleasant, morbidly obese female.  NECK:  She had no JVD visible in her neck.  She had bilateral crackles one-  third of the way up, right greater than left, in her lungs.  CARDIOVASCULAR:  She had distant heart sounds with regular rate and rhythm.  ABDOMEN:  Benign.  EXTREMITIES:  She had 2+ pitting edema up to the midshin bilaterally and  good pulses in her feet.   LABORATORY DATA:  An  ABG in the emergency room showed a pH 7.36, pCO2 of 54,  and pO2 of 65.  She had a creatinine of 1.4 and a hemoglobin of 8.7.   HOSPITAL COURSE:  Problem 1:  CONGESTIVE HEART FAILURE EXACERBATION:  The  patient was started on her home medications and a higher dosage of Lasix.  She diuresed appropriately putting out about 10 liters of fluid net during  her hospitalization.  Her weight on admission was 357 pounds.  Her weight at  discharge was 342 pounds.  She was also kept on a strict diet while in the  hospital.  She was asked not to bring in any outside food including any  sugar containing substances or salt containing substances.  This patient  ruled out for MI with three sets of normal enzymes.  She had a normal urine  drug screen, normal TSH.  It is uncertain exactly what caused her CHF  exacerbation, but the most likely cause was either medicine noncompliance or  poor dietary habits.  She will be continued on her home medications when she  leaves the hospital as that is basically what she has been on while she has  been here.  She will return to her full dosage of Lasix at 120 mg b.i.d.  She has been instructed that her diet was probably the cause of this  hospitalization and that she needs to be  very careful about salt containing  substances in the future.   Problem 2:  NORMOCYTIC ANEMIA:  The patient had a hemoglobin of 8.8 at the  time of admission.  This remained about the same during her hospitalization.  Again, workup was done for her anemia including a slightly decreased total  iron of 40 and a normal TIBC.  She had a normal ferratin at 105.  Her TSH  was normal.  It does not appear that she is iron deficient.  She did have a  abnormal hemoglobin noted on her hemoglobin A1C when this was taken.  This  was not pursued further as her anemia is not clinically significant and this  may need workup in the future if her anemia worsens.   Problem 3:  DIABETES MELLITUS TYPE 2:  The patient had a hemoglobin A1C of  9.1.  She is on heavy doses of insulin at 100 units b.i.d. of 70/30 insulin.  Her sugars were very well controlled while she was in the hospital on her  home regimen of insulin.  We believe the difference is the diet that she was  on while she was in the hospital compared to the diet she is on at home.  She was constantly reminded of this fact, and it is hoped that she will  follow a diabetic diet when she gets home.   Problem 4:  HYPERTENSION:  The patient was started on Cozaar during this  hospitalization.  She has had a bad reaction to ACE inhibitors in the past  but she tolerated the Cozaar well.  The Cozaar did not effect her creatinine  in any significant way.  There is hopes that the Cozaar will not only help  to lower her blood pressure but will also help protect her kidneys from  diabetes.  Moderate blood pressure control was kept during this  hospitalization but her medications will still need titration upwards as an  outpatient, particularly when she starts eating her home diet again.   Problem 5:  GOUT:  The patient had never had gout before  but she developed  an inflamed, painful ankle during the hospitalization.  Colchicine was started and she started  feeling better the next day.  She will continue on  Colchicine at home.  It was decided not to start her on allopurinol at this  time as this is her first gouty flare.  Allopurinol can be considered in the  future if she continues to have gouty flares.  She was also instructed to  try using NSAIDs if colchicine was not enough for her pain.    LABORATORY DATA:  Hemoglobin 9.3, WBC 6.4, platelets 256.  Sodium 140,  potassium 3.5, chloride 101, CO2 31, BUN 27, creatinine 1.4, glucose 91.                                                Thressa Sheller, M.D.    BM/MEDQ  D:  12/31/2001  T:  01/01/2002  Job:  [id number]

## 2010-06-07 NOTE — Discharge Summary (Signed)
NAMEADALENA, ENSMINGER NO.:  [id number]   MEDICAL RECORD NO.:  [id number]          PATIENT TYPE:  INP   LOCATION:  3025                         FACILITY:  [facility]   PHYSICIAN:  Levy Sjogren, M.D.     DATE OF BIRTH:  10-06-1945   DATE OF ADMISSION:  08/27/2004  DATE OF DISCHARGE:  09/02/2004                                 DISCHARGE SUMMARY   PRIMARY CARE PHYSICIAN:  Joelene Millin R. Karlton Lemon, M.D.   REASON FOR ADMISSION:  Hyperglycemia.   DISCHARGE DIAGNOSES:  1.  Polymyalgia rheumatica which improved with steroids.  The patient was      hurting all over.  2.  Acute renal insufficiency.  3.  Chronic anemia with possibility of some blood loss anemia as well.  4.  Deconditioning.  Inability to move and care for herself.  5.  Very brittle diabetes requiring massive doses of insulin.  6.  Congestive heart failure.  7.  Obstructive sleep apnea on CPAP nightly.  8.  Hypertension.  9.  Post herpetic neuralgia.  10. Morbid obesity.   DISCHARGE MEDICATIONS:  To be decided at the time of discharge.   PROCEDURE:  None.   ACTIVE PROBLEMS:  Problem 1. Hyperglycemia.  This was the initial reason for  admission.  The patient was found on the floor and was brought to the  emergency room for evaluation and found her blood sugar to be over 600 and  was admitted for treatment of that.  The hyperglycemia responded quickly  with IV fluids and insulin by Glucommander protocol.  The patient continued  to have very brittle diabetes and I had to increase her insulin from 60  units in the morning and 50 in the evening to 100 units in the morning and  90 units in the evening and the patient continued to have blood sugar  readings in the 200-300.  Problem 2.  Polymyalgia rheumatica.  Three days after admission the patient  started to complain of hurting all over.  This was very remarkable  especially when I watched the patient work with physical therapy.  She was  unable to participate at  [place] she was screaming every time they touched  her.  I checked ESR which was over 140, and the patient responded very  beautifully with one dose of prednisone 20 mg p.o.  The usual dose for  polymyalgia rheumatica is 10 mg a day and then weaned down very slowly like  1 mg every two weeks.  Started her on 20 just because she is morbidly obese  and she may require higher doses.  I would plan to wean her down quickly to  10 and then very slowly afterwards.  Problem 3.  Acute renal insufficiency.  This improved with IV fluids.  It  required the medication doses to be adjusted.  I suspect that the patient  has baseline chronic renal insufficiency.  I do not know what her baseline  would be.  Problem 4. Anemia.  The patient has a history of anemia of chronic disease  and her hemoglobin was low on admission.  However, it started to drift lower  and lower slowly to 7.6 on last day of service.  I believe she had some  small amount of GI bleed but not actively bleeding and I think the risk of  having DVT and PE is much greater.  Continue to follow hemoglobin at this  point and continue the Lovenox and low-dose aspirin.  Problem 5. Deconditioning.  I found from patient that although she lives by  herself, she was unable to do anything for herself and she would wait for  her grandchildren and her other relatives and friends to come by to get her  food and check her blood sugar and all of her activities of daily living and  that is why the patient shall not go home from this admission but the  process of sending the patient to a skilled nursing facility was initiated.  Problem 6. CHF.  The patient developed some shortness of breath as a result  of IV fluids but responded well after decreasing the IV fluids.  She is  currently on Coreg.  I had to hold her Losartan because of her renal  insufficiency.  Problem 7. Obstructive sleep apnea.  She is doing well with nightly CPAP.  Problem 8. Obesity.   The patient is massively obese.  She will need dietary  counseling and intensive weight loss program.  Problem 9. Gout.  No change.  Has severe disease.  On colchicine because of  acute renal insufficiency.  Problem 10. Hypertension.  Blood pressure initially was controlled but then  we had to stop the Losartan because of renal failure and I found out that  she was to continue on Zoloft, so I restarted that and I expect her blood  pressure to get back in control.   DISPOSITION:  As said, she needs to go into nursing home for PT/OT and  weight loss as well as better diabetic control and overall better quality of  life.  Throughout her hospitalization the patient was making decisions for  herself.  There was no family involved and I think one of her nieces called  from [place] but they are not very familiar with the patient's condition  and not very involved.      Levy Sjogren, M.D.  Electronically Signed     SA/MEDQ  D:  09/02/2004  T:  09/02/2004  Job:  [id number]

## 2010-06-07 NOTE — Op Note (Signed)
[name], [name] NO.:  [id number]   MEDICAL RECORD NO.:  [id number]          PATIENT TYPE:  INP   LOCATION:  [id number]                         FACILITY:  [facility]   PHYSICIAN:  Cynda Familia, M.D.DATE OF BIRTH:  1945/06/23   DATE OF PROCEDURE:  08/07/2005  DATE OF DISCHARGE:                                 OPERATIVE REPORT   PREOPERATIVE DIAGNOSIS:  Left knee end-stage osteoarthritis.   POSTOPERATIVE DIAGNOSIS:  Left knee end-stage osteoarthritis.   PROCEDURE:  Left total knee arthroplasty.   SURGEON:  Cynda Familia, M.D.   ASSISTANT:  Judith Part. Chabon, PA-C.   ANESTHESIA:  Spinal.   ESTIMATED BLOOD LOSS:  Less than 50 mL.   DRAINS:  Two medium Hemovac.   COMPLICATIONS:  None.   TOURNIQUET TIME:  1 hour and 40 minutes at 350 mmHg.   COMPLICATIONS:  None.   DISPOSITION:  PACU stable.   OPERATIVE IMPLANTS:  Wynetta Emery & [employer]. Size 4 femur, 4 tibia,  35 patella, 10 mm posterior stabilized rotating platform tibial insert. All  cemented.   DESCRIPTION OF PROCEDURE:  The patient was counseled in the holding area,  correct side was identified. An IV started, antibiotics were given and also  she was given 1 unit of blood ordered by her anesthesiologist. She was then  taken to the operating room where the spinal anesthetic was administered. A  Foley catheter placed utilizing sterile technique by the OR circulating  nurse. All extremities well padded and bumped. The left lower extremity was  elevated. It was prepped with DuraPrep and draped in a sterile fashion.  Exsanguinated with esmarch, tourniquet was inflated to 350 mmHg with the  large size of her thigh. A straight midline incision made through the skin  and subcutaneous tissue, small veins electrocoagulated, medial and lateral  soft tissue flaps were developed. The patient had morbid obesity which made  the entire surgical procedure much more difficult. Medial  parapatellar  arthrotomy was performed, plus a medial soft tissue release was done. The  knee was flexed, patella was retracted out of the way, very tight knee.  Osteophytes removed, intercondylar notch and cruciate ligaments were  resected. A starting hole made in the distal femur, canal was irrigated, the  effluent was clear. An intramedullary rod was gently placed which shows a 10  mm cut off the distal femur in a 5 degree valgus size. The femur was found  to be a size #4, rotational marks were made and it was cut to fit a size #4.  Medial and lateral menisci removed under direct visualization. Geniculate  vessels were coagulated. The posterior intervascular structures were thought  of and protected throughout the entire case. The tibial eminence was  resected, osteophytes removed from the proximal tibia, tibia was found to be  a size 4. A central hole was made, step-reamer was utilized, canal was  irrigated, effluent was clear. The intramedullary rod was gently placed. I  took a 10 mm cut based upon the least deficient or lateral side. This gave  [place] a nice resection level at 0  degree slope. Posteromedial and  posterolateral femoral osteophytes removed under direct visualization.  Flexion extension blocks were trialed for a 10 mm insert and these were  excellent. The tibial baseplate was set, coverage was maintained, pins were  applied, step reamer and punch were then performed. A femoral box cut was  now performed in the standard fashion. With a 4 femur, 4 tibia, 10 insert,  we had excellent range of motion and soft tissue balance and tracking. The  patella was found to be a size 35, appropriate amount of bone was removed,  patella button was applied. All trials were removed. The knee joint was  irrigated with pulsatile lavage. At this time, all components were opened,  cement was mixed utilizing modern cement technique. All components were  cemented into place, a 4 tibia, 4 femur, 35  patella, cement had cured,  excess cement was removed. The knee was again irrigated with pulsatile  lavage and put in to implant a 10 mm posterior stabilized rotating platform  tibial insert. We now had full extension and flexion limited by the drapes.  Varus and valgus stressing was stable. Flexion extension gaps were  appropriate. Patellofemoral tracking was anatomic. The knee was again  irrigated.   Two medium Hemovac drains were placed. Sequential closure of layers was  done, arthrotomy Vicryl, subcu Vicryl, skin closed with staples due to the  large body habitus. A sterile dressing was applied to the knee, drains  hooked to suction, sterile dressing applied. The patient was then awakened  and she was taken from the operating room to PACU in stable condition.  Sponge and needle counts correct. No complications or problems and vascular  examination of the foot and ankle was unchanged at the end of the case.   To help with surgical retraction and decision making, Mr. Richardson Landry Chabon's  assistance was needed throughout the entire case.           ______________________________  Cynda Familia, M.D.     RAC/MEDQ  D:  08/07/2005  T:  08/08/2005  Job:  [id number]   cc:   Royetta Crochet. Karlton Lemon, M.D.  Fax: [phone]

## 2010-06-07 NOTE — Discharge Summary (Signed)
[name], [name] NO.:  [id number]   MEDICAL RECORD NO.:  [id number]          PATIENT TYPE:  INP   LOCATION:  [id number]                         FACILITY:  [facility]   PHYSICIAN:  Birdie Riddle, M.D.  DATE OF BIRTH:  October 01, 1945   DATE OF ADMISSION:  11/24/2005  DATE OF DISCHARGE:  11/27/2005                               DISCHARGE SUMMARY   PRINCIPAL DIAGNOSES:  1. Congestive heart failure, chronic diastolic vascular heart failure.  2. Hypertension.  3. Diabetes mellitus type 2.  4. Gout.  5. Iron deficiency anemia.  6. Morbid obesity.  7. Old myocardial infarction.   DISCHARGE MEDICATIONS:  1. Altace 5 mg 1 daily.  2. Coreg 25 mg 1 twice daily.  3. Gabapentin 600 mg 3 times daily.  4. Aspirin 325 mg 1 daily.  5. Hyzaar 50/12.5 mg 1 daily.  6. Colchicine 0.6 mg 1 twice daily.  7. Allopurinol 300 mg 1 daily.  8. Sular 10 mg 1 daily.  9. Amitriptyline 75 mg 1 at bedtime.  10.Calcium 600 mg 1 daily.  11.Humulin 70/30 10 units twice daily subcutaneously.  12.Docusate sodium 100 mg 1 daily.  13.Ferrous sulfate 325 mg twice daily.  14.Lasix 40 mg 1 daily.   DIET:  Low-fat, low-salt diet with 1500 calorie diabetic diet.   FOLLOWUP:  Follow up by Dr. Velora Heckler on November 14 at 2:30 p.m. and by  Dr. Doylene Canard in 2 weeks.  Patient to call [phone] for appointment.   HISTORY:  This 65 year old black female presented with a 30 pound weight  gain in [redacted] weeks along with leg edema and shortness of breath.   PHYSICAL EXAMINATION:  GENERAL:  Patient is averagely built, well-  nourished, black female in mild respiratory distress.  Patient alert and  oriented x3.  VITAL SIGNS:  Pulse 84, respirations 20, blood pressure 172/90, height  5'11, weight 354 pounds, temperature 95.2.  HEAD:  Normocephalic, atraumatic with gray hair.  EYES:  [place] with bilateral lens haziness.  Patient wears reading  glasses.  EARS, NOSE, THROAT:  Revealed mucous membranes pink and moist.   Patient  is edentulous and occasionally wears partial upper dentures.  NECK:  No JVD, no carotid bruit.  Has palpable thyroid with mild  prominence.  Full range of motion of the neck.  LUNGS:  Bilateral basilar crackles.  HEART:  Normal S1 S2 with grade 3/6 systolic murmur.  ABDOMEN:  Soft, distended but nontender.  EXTREMITIES:  No cyanosis or clubbing.  Edema, 2+ extending up to the  mid thigh.  Has mild skin discoloration bilaterally.  Vertical scar of  surgery over the left knee.  CNS:  Cranial nerves grossly intact.  Unequal grips with right-sided  weakness.  Numbness extending to both hands and the right forearm.   LABORATORY DATA:  Hemoglobin 8.2, hematocrit 25.1.  Normal WBC, platelet  count.  Normal PT INR.  Normal electrolytes.  BUN 23, creatinine 1.5.  Sugar elevated at 180.  Subsequent sugar was down to 117.  Liver enzymes  normal.  Albumin low at 2.3.  Hemoglobin A1c normal at 6.  CK-MB  and  troponin-I normal x3.  TSH normal at 3.6.  Iron level borderline at 45.  Saturation low at 18%.  Chest x-ray revealed vascular congestion.  EKG  revealed sinus rhythm with occasional premature ventricular complexes.   HOSPITAL COURSE:  Patient was admitted to telemetry unit, myocardial  infarction was rule out.  She received IV Lasix with significant  improvement in leg edema within 48 hours.  On November 27, 2005, she was  discharged home in satisfactory condition with additional workup on  outpatient basis.      Birdie Riddle, M.D.  Electronically Signed     ASK/MEDQ  D:  01/08/2006  T:  01/08/2006  Job:  [id number]

## 2010-06-07 NOTE — Consult Note (Signed)
[name], AFFELDT NO.:  [id number]   MEDICAL RECORD NO.:  [id number]          PATIENT TYPE:  INP   LOCATION:  0159                         FACILITY:  [facility]   PHYSICIAN:  Thomas A. Cornett, M.D.DATE OF BIRTH:  1946/01/16   DATE OF CONSULTATION:  12/28/2004  DATE OF DISCHARGE:                                   CONSULTATION   REASON FOR CONSULTATION:  Abdominal pain.   REFERRING PHYSICIAN:  Sherryl [name], M.D.   HISTORY OF PRESENT ILLNESS:  The patient is a 65 year old female who I was  asked to see at the request of Dr. Blenda Nicely due to abdominal pain.  She was  admitted on December 26, 2004, with shortness of breath, chest pain and pain  all over.  It was felt that she may have pneumonia or a pulmonary embolus  and was worked up for that.  Due to her size and her severe morbid obesity  and multiple medical problems, she is unable to have a CT angiogram nor a VQ  scan.  She has developed some left lower quadrant pain and I was asked to  see the patient at the request of Dr. Blenda Nicely for this.  The pain has been  present all week, but apparently today it has become more severe.  She did  not complain of it much when she was admitted looking at her old records as  far as I can tell.  She has significant comorbidity which include type 2  diabetes mellitus, obesity weight 325 pounds, hypertension, chronic renal  insufficiency, polymyalgia rheumatica, obstructive sleep apnea, gout,  congestive heart failure, anemia and inability to ambulate due the above-  mentioned medical problems.  She was recently hospitalized in August 2006,  with similar type complaints.  It was noted that she has had a history of  constipation in the past as well.  Currently, she is complaining of moderate  to severe left lower quadrant pain.  She is unable to move very well so it  is difficult to see if this inhibits her ambulation.  There is no history of  diarrhea.  There is no reported bowel  movements and I am not exactly sure  when her last bowel movement was and the patient is not sure either.  There  is associated shortness of breath, chest pain as well.  There is no nausea  or vomiting recorded.   PAST MEDICAL HISTORY:  1.  Type 2 diabetes.  2.  Obesity, morbid.  3.  Hypertension.  4.  Chronic kidney disease.  5.  Polymyalgia rheumatica.  6.  Obstructive sleep apnea.  7.  Gout.  8.  Congestive heart failure.  9.  Anemia.   FAMILY HISTORY:  Noncontributory.   SOCIAL HISTORY:  She lives with her granddaughter and is disabled.  She  denies tobacco or alcohol use.   PAST SURGICAL HISTORY:  Exploratory laparotomy for hiatal hernia and ventral  hernia.   MEDICATIONS:  1.  Elavil 75 mg a day.  2.  Torsemide 20 mg daily.  3.  Allopurinol 100 mg daily.  4.  Calcium one tablet daily.  5.  Aspirin 81 mg daily.  6.  Prevacid 30 mg daily.  7.  Hyzaar 100/25 mg daily.  8.  Neurontin 600 mg twice a day.  9.  Colchicine 0.6 mg daily.  10. Coreg, unknown dose.  11. Ramipril, unknown dose.  12. History of prednisone from her discharge summary from August, but no      documentation of this on her chart.   REVIEW OF SYSTEMS:  As stated above, otherwise 10-piont review of systems  are per her history of present illness.   PHYSICAL EXAMINATION:  VITAL SIGNS:  Temperature 101.0, currently 97.4,  blood pressure 110/30, heart rate 75.  GENERAL:  Morbidly obese female lying on her side, pleasant in no obvious  distress.  HEENT:  Mucous membranes are moist.  Oropharynx is clear.  NECK:  Short without signs of JVD.  CHEST:  Wheezes noted.  CARDIAC:  Regular rate and rhythm.  ABDOMEN:  Morbidly obese, midline incision noted.  Left lower quadrant with  some tenderness to palpation and fullness more than the right lower  quadrant.  This may represent a very large ventral hernia versus simply  abdominal wall diastasis.  Bowel sounds are present throughout the abdomen.   EXTREMITIES:  Edema noted.  NEUROLOGIC:  The patient's affect is appropriate.  She does move all four  extremities, but is disabled due to her size.   LABORATORY DATA AND X-RAY FINDINGS:  White count today of 10,400, hemoglobin  8.1, platelet count 186,000.  Sodium 138, potassium 4.2, chloride 105, CO2  26, BUN 35, creatinine 2.1, glucose 83.   IMPRESSION:  Abdominal pain.   RECOMMENDATIONS:  At this point in time, she will go for a noncontrasted CT  scan to exclude a ventral hernia as a cause of this.  She has a very thin-  walled abdominal wall from her previous surgery.  It is unclear to me if she  has an incarcerated hernia or if she is distended.  She has a history of  constipation and this indeed could contribute as well given that her white  count is normal.  The patient poses an extremely high operative risk with an  extremely high rate of perioperative complications, morbidity and mortality  close to anywhere from 20-30% due to her inability to ambulate.  No elective  surgery should be done on her unless what we find is emergent and deemed to  be absolutely necessary.  We will await CT scan to see what is found, but  again this patient is an extremely high operative risk even with elective  intervention and should be avoided if at all possible.     Thomas A. Cornett, M.D.  Electronically Signed    TAC/MEDQ  D:  12/28/2004  T:  12/28/2004  Job:  [id number]   cc:   [facility]

## 2010-06-07 NOTE — Discharge Summary (Signed)
NAMEMARKEYA, BOAG NO.:  [id number]   MEDICAL RECORD NO.:  [id number]          PATIENT TYPE:  INP   LOCATION:  0159                         FACILITY:  [facility]   PHYSICIAN:  Sherryl Manges, M.D.  DATE OF BIRTH:  [birth year]   DATE OF ADMISSION:  12/26/2004  DATE OF DISCHARGE:                                 DISCHARGE SUMMARY   INTERIM DISCHARGE SUMMARY:   DIAGNOSES:  1.  Possible community-acquired pneumonia.  2.  Obesity/Obstructive sleep apnea syndrome. On nocturnal CPAP.  3.  Incarcerated ventral hernia; status post spontaneous reduction.  4.  Hypertension.  5.  History of congestive heart failure, ejection fraction 55 to 65%.  6.  Anasarca.  7.  Type 2 diabetes mellitus.  8.  Fare-up of gouty arthritis.  9.  Anemia of chronic disease.  10. Chronic renal insufficiency.  11. History of polymyalgia rheumatica.   DISCHARGE MEDICATIONS:  This is to be listed in addendum at the time of  actual discharge by discharging physician.   PROCEDURES:  1.  Chest x-ray dated December 26, 2004.  This showed cardiomegaly and      pulmonary vascular congestion, mild interstitial prominence, compatible      with mild interstitial edema versus infection.  There was also mild      lower lobe atelectasis.  2.  Chest x-ray dated December 27, 2004.  This showed slight increase in      vascular atelectasis, otherwise stable chest.  3.  Portable chest x-ray dated December 27, 2004.  This showed overall slight      improvement in overall lung aeration secondary to resolving pulmonary      venous congestion and decreased bibasilar atelectasis.  Cardiomegaly      persists.  4.  Abdominal and pelvic CT scan dated December 28, 2004.  This showed      ventral hernia containing nonobstructive cancerous colon.  A second area      of ventral herniation contains omental fat.  Probable left adrenal      adenoma, right renal lesion which may represent a minimally complex      cyst, also  small hiatal hernia.  There is pericystic edema which could      be related to cystitis.  The patient is status post hysterectomy.  5.  Chest x-ray dated December 29, 2004.  This showed slight worsening of      edema.  6.  Chest CT angiogram dated December 30, 2004.  This showed no CT evidence      of pulmonary emboli.  There was mild edema and atelectasis and tiny      bilateral effusions, scattered mediastinal lymph nodes, small hiatal      hernia, left thyroid goiter.  7.  Bilateral lower extremity Dopplers dated December 27, 2004.  This showed      no clear evidence of DVT, superficial embolus, or Rasp's cyst.  8.  A 2-D echocardiogram dated  December 30, 2004.  This showed overall      normal left ventricular systolic function, LVEF 55 to 65%.  This study  was inadequate for evaluation of left ventricle regional wall motion.      Left atrium was mildly dilated.   CONSULTATIONS:  1.  Danton Sewer, M.D., pulmonologist.  [place]ressey. Cornett, M.D., general surgeon.   ADMISSION HISTORY:  As in H&P notes of December 25, 2004.  However, in brief,  this is a 65 year old female, with known history of type 2 diabetes  mellitus, obesity, obstructive sleep apnea syndrome, congestive heart  failure, gout, anemia of chronic disease, polymyalgia rheumatica, and  chronic kidney disease, as well as hypertension, who presented with  progressive shortness of breath as well as pain which is generalized and  maximal in both wrists.  Initial laboratory evaluation showed a mild  leukocytosis.  Chest x-ray revealed bilateral air space disease.  Physical  examination revealed hypertension with a blood pressure of 182/91 and also  temperature of 101.7.  The patient was admitted for further evaluation,  investigation, and management.   CLINICAL COURSE:  #1.  POSSIBLE COMMUNITY-ACQUIRED PNEUMONIA:  The patient presents with  progressive shortness of breath, temperature of 101.7, and chest x-ray   findings, consistent with bilateral air space disease.  Community-acquired  pneumonia was suspected.  The patient was, therefore, placed on a  combination of Rocephin and azithromycin, oxygen supplementation,  bronchodilator nebulizers. She defervesced.  White cell count normalized.  Shortness of breath ameliorated.  However, subsequent chest x-rays showed no  real evidence of focal infiltrates, suggestive of pneumonia.  Findings  appeared more consistent with pulmonary edema.  The patient did, however,  complain of resistant left-sided pleuritic chest pain and had an elevated D-  dimer.  It was felt that she was certainly at risk for venous thromboembolic  disease, given her morbid obesity and reduced mobility, and workup was  instituted for this, while patient was empirically started on therapeutic  doses of heparin.  Bilateral venous Dopplers dated December 27, 2004, showed  no evidence of DVT.  We experienced initial technical difficulties in  obtaining appropriate pulmonary investigations, as chest CT angiogram could  not initially be done since patient on December 27, 2004, had a creatinine of  2.  In addition, an alternative which would have been a ventilation  perfusion lung scan proved impossible, as patient was unable to lie flat for  any significant period of time. Because of the need to determine the  appropriate way forward, pulmonary consultation was requested which was  kindly provided by Dr. Danton Sewer, who concluded that, should it be  impossible to perform appropriate pulmonary studies, the patient may be a  candidate for 84-month empiric anticoagulation therapy.  Fortunately, by  December 30, 2004, the patient's creatinine had improved significantly and  was 1.4.  Chest CT angiogram was, therefore, done which showed no evidence  of pulmonary embolism.  The patient was, therefore, switched to prophylactic  Lovenox.  #2.  ANASARCA/CONGESTIVE HEART FAILURE:  Chest x-ray  consistently showed  features, suggestive of pulmonary edema.  Physically, the patient appeared  have anasarca, with generalized edema affecting the upper and lower  extremities.  She was, therefore, managed with intravenous Lasix, with a  steady drop in weight and satisfactory clinical response.  It was felt  anasarca/ pulmonary edema was contributory to her presenting shortness of  breath.  Shortness of breath ameliorated quickly during the course of her  hospital stay.  A 2-D echocardiogram of December 30, 2004, showed overall  normal left ventricular systolic function with an EF of 55 to 65%,  although  there was a mildly dilated left atrium.  It is likely the patient has  diastolic dysfunction.  On December 28, 2004, the patient did show a slight  bump in cardiac enzymes which went from 0.03 to 0.02 and then suddenly to  0.15.  Cardiology consultation was called, which was kindly provided by Dr.  Acie Fredrickson. However, as the cardiac enzymes remained completely normal at 0.02  subsequently, and there were no concomitant EKG changes, it was concluded  that this must have been a laboratory error.  Certainly the patient did not  experience any anginal chest pain.   #3.  INCARCERATED VENTRAL ABDOMINAL HERNIA:  On December 28, 2004, the  patient complained of significant lower abdominal pain.  Physical  examination revealed a circumscribed area of swelling in the left lower  abdominal quadrant close to the midline.  There were no overlying skin  changes.  Bowel sounds were heard.  Abdominal CT scan was requested, and  surgical opinion was called, which was kindly provided by Dr. Brantley Stage.  As  it turned out, abdominal/pelvic CT scan demonstrated an incarcerated ventral  abdominal hernia.  Initially, surgery was contemplated, however, this  reduced spontaneously and patient experienced no further symptoms referable  to this.  Surgery has been deferred.   #4.  FLARE-UP OF ACUTE GOUTY ARTHRITIS:  The  patient has a known history of  gouty arthritis.  At the time of presentation, she had experienced  generalized musculoskeletal type pain, maximal in both wrists, which were  found to be swollen and tender on physical examination, with decreased range  of motion.  It was felt that this was secondary to possible flare-up of  polyarticular gout.  As NSAIDs were relatively contraindicated in this  patient, with history of chronic renal insufficiency.  She was managed with  simple analgesics and steroid taper.  She was continued on allopurinol, and  subsequently as renal function improved, colchicine was added to her  treatment.  She has experienced gradual but steady improvement in her  arthritic symptoms.   #5.  DIABETES MELLITUS:  This was initially uncontrolled at the time of  presentation.  However, during the course of patient's hospital stay,  diabetes has responded to a combination of carbohydrate-modified diet, sliding scale insulin, and scheduled Lantus.   #6.  ANEMIA OF CHRONIC DISEASE:  Likely associated with diabetes mellitus  and chronic renal insufficiency.  The patient, at the time of presentation,  had a hemoglobin of 10.  There had been a gradual downward drift of  hemoglobin which was 8.4 on December 30, 2004.  Iron studies revealed serum  iron of 55, TIBC 193, percent saturation 28%, with ferritin of 1443.  Anemia  felt consistent with anemia of chronic disease.  Stool guaiac studies are  negative, effectively ruling out GI blood loss.  Close monitoring of  patient's hemoglobin level is continued.   #7.  CHRONIC RENAL INSUFFICIENCY: At presentation, the patient's creatinine  was 1.9 with a BUN of 22. There was a bump in serum creatinine level on  December 27, 2004, to 2, and subsequently to 2.1.  Thereafter, possibly with  the effect of diuretic therapy  and improvement in cardiac function, there  has been a downward trend of serum creatinine so that on December 30, 2004,  creatinine was quite significantly improved at 1.4.   #8.  HISTORY OF POLYMYALGIA RHEUMATICA:  This may account for some of  patient's upper extremity pain and generalized musculoskeletal pain.  Certainly steroids instituted for management of gout may have had a positive  impact on this.  The patient will likely need rheumatoid followup on  discharge.  This is expected to be arranged by patient's primary physician.   DISPOSITION:  This will be elucidated in detail, by discharging physician at  the time of actual discharge.  However, it is expected that the patient will  complete her antibiotic treatment after completion of a 7-day course.      Sherryl Manges, M.D.  Electronically Signed     CO/MEDQ  D:  01/01/2005  T:  01/01/2005  Job:  [id number]   cc:   Danton Sewer, M.D. [facility]  [address] [address]   Thomas A. Cornett, M.D.  [place] [address]   Kimberly R. Karlton Lemon, M.D.  Fax: [phone]   Thayer Headings, M.D.  Fax: [phone]

## 2010-06-07 NOTE — Discharge Summary (Signed)
[name], SIMAN NO.:  [id number]   MEDICAL RECORD NO.:  [id number]          PATIENT TYPE:  INP   LOCATION:  [id number]                         FACILITY:  [place]   PHYSICIAN:  [name], M.D.  DATE OF BIRTH:  [birth year]   DATE OF ADMISSION:  11/21/2003  DATE OF DISCHARGE:  11/29/2003                                 DISCHARGE SUMMARY   Intern [name], M.D. dictating for resident [name], M.D.   DISCHARGE DIAGNOSES:  1.  Chest pain secondary to post herpetic neuralgia.  2.  Adrenal insufficiency secondary to long term steroid use.  3.  Hypoxia secondary to obstructive sleep apnea and pain.  4.  Diabetes mellitus.  5.  Hypertension.  6.  Anemia.  7.  Acute renal failure, resolved.  8.  PMR questionable diagnosis.  9.  Morbid obesity.  10. Depression.   DISCHARGE MEDICATIONS:  1.  Neurontin 600 mg p.o. t.i.d.  2.  Elavil 75 mg p.o. q.h.s.  3.  Cozaar 100 mg p.o. daily.  4.  Novolin insulin 70/30, 60 units a.m. and 45 units p.m.  5.  Prednisone 10 mg once daily for two days, then 5 mg p.o. daily for one      week, next 5 mg on alternating days.  6.  Aspirin 325 mg p.o. daily.  7.  Prevacid 30 mg p.o. daily.  8.  Multivitamin tablet p.o. daily.  9.  Colchicine 0.6 mg p.o. b.i.d.  10. Dulcolax 30 mg p.o. daily x 7 days.  11. Home oxygen with portable HELiOS tanks 2 to 3 liters.   DISPOSITION:  The patient is discharged home in fair condition.  She will be  following up with her primary care physician, Dr. Deborra [name], on December 04, 2003 at 8:45 a.m. in Outpatient Clinic at the [facility].   PROCEDURES:  No procedures obtained.   CONSULTATIONS:  No consultations were obtained.   BRIEF ADMITTING HISTORY AND PHYSICAL WITH ADMISSION LABORATORIES:  [name]  is a 65 year old female who was seen in the ER, on November 21, 2003, for  complaints of chest pain, left sided, pleuritic, constant since September,  worsening since a  week, evaluated and sent for further evaluation at  outpatient clinic.  Upon arrival at clinic, she was lethargic, hypoxic with  O2 sat 87%.  She was given 0.2 IV Narcan.  O2 sats went up to 90s.  CBGs  checked at that time showed blood sugar level 45, p.o. glucose was given,  and admitted for further evaluation.   PHYSICAL EXAMINATION:  VITAL SIGNS:  Pulse 94, blood pressure 119/71,  temperature 100 degrees, respiratory rate 14.  O2 sat 87%, recheck 98% on 3  liters and 90% on 2 liters.  GENERAL:  She was in mild distress secondary to pain, lethargic, obese.  HEENT:  Pupils are equal, round, and reactive to light and accommodation.  Extraocular movements intact.  NECK:  Supple without JVD, without carotid bruits.  LUNGS:  Clear to auscultation bilaterally with distant breath sounds.  Tenderness to palpation severe over the left upper chest  and posterior left  upper chest.  HEART:  Regular rate and rhythm with no murmurs, rubs or gallops.  ABDOMEN:  Soft, obese, nontender abdomen.  Bowel sounds positive.  EXTREMITIES:  Without edema.  Pulses 2+.  SKIN:  Dry mucous membranes.  No rash.   ADMISSION LABORATORIES:  Showed sodium 139, potassium 3.8, chloride 104,  bicarbonate 27, BUN 18, creatinine 1.2, glucose 45.  CBC:  White count 11.5,  hemoglobin 9.4, platelet count 229.  ABG showed pH 7.46, carbon dioxide  40.6, pO2 46.7.  Point-of-care markers x 3 negative.  BNP less than 30.   EKG did not show any signs of ischemia.  Normal sinus rhythm.   Chest x-ray showed stable cardiac enlargement.   HOSPITAL COURSE:  1.  Chest pain.  Chest pain likely secondary to post herpetic neuralgia      since the pain is typically dermatomal in distribution.  Healed herpetic      lesions present on the left upper chest.  Unlikely cardiac origin given      her long history of pain, continuous in nature and EKG with no signs of      ischemia and cardiac markers negative.  She was started on Neurontin  low      dose 300 mg daily and titrated up slowly to 600 mg p.o. t.i.d. at the      time of discharge, also started on Elavil 50 mg and went up to 75 mg      q.h.s.  Although she did not get significant relief from pain given the      fact that refractory pain secondary to neuralgia would take at least      three months to show any difference.  We will continue her on this      regimen for now.  May need to be tried on different analgesic after that      since the patient did not get treatment for her post herpetic lesions      likely poor outcome of therapy.  2.  Adrenal insufficiency secondary to steroid withdrawal.  The patient was      started on stress dose steroids hydrocortisone 125 mg q.8h. slowly      tapered her off and put on prednisone of 10 mg which was her usual      steroid dose at home at that time.  Was also started on empiric      antibiotics Rocephin and Zithromax.  Chest x-ray did not show any      infiltrate or signs of pneumothorax.  Probable component of volume      depletion, so also started on IV fluids 100 cc normal saline per hour.  3.  Hypoxia.  Multifactorial, likely secondary to respiratory depression      from sedative analgesic.  The patient was on high dose Vicodin.  O2 sats      improved with Narcan consumption.  Chest x-ray did not show any      infiltrates or pneumothorax.  Possible added component of pain induced      hypoxia.  4.  Hypertension.  D/C'd Lasix and Zaroxolyn since the patient developed      acute renal failure and creatinine level was going up to 1.6.  Continued      her on Cozaar 100 mg p.o. daily.  Blood pressure was under good control      throughout her hospital course.  5.  Diabetes mellitus, type 2.  Decreased her  insulin dose to 70/30, 60      units a.m. and 45 units p.m. because of episodes of hypoglycemia.  Good      control throughout hospital course. 6.  Anemia.  Hemoglobin level was stable about 8 throughout her hospital       course.  Since she has anemia of chronic disease and a history of      falling hemoglobin level during her previous hospitalization, did not do      any further workup at that time.  Her stool for hemoccult was negative.  7.  Acute renal failure, resolved likely secondary to diuretics and      dehydration, prerenal.  D/C'd her Lasix and increased IV fluids.      Creatinine level was 1.6 on the 5th.  It slowly came down to 1.1 on the      day of discharge.  8.  Gout flare up.  Prior to the day of discharge, the patient developed      diffuse aches all over her body and tenderness to palpation over      bilateral wrists, small finger joints, elbows, and ankles.  She was      started on Colchicine 0.6 mg b.i.d. and discharged with the same dose.      Did not much relief during her hospital course.  Arranged for PT and OT      consult from [facility].  9.  PMR on prednisone taper.  Discharged her on 10 mg daily __________ .  10. Obstructive sleep apnea.  O2 sats remain in the high 90s throughout her      hospital course on CPAP, 2 liters oxygen.  She also has been arranged      for portable HELiOS oxygen tanks to use at home.   DISCHARGE LABORATORIES:  CBC with a white count of 7.9, hemoglobin 8.5,  thrombocyte count 268.  B-MET:  Sodium 139, potassium 4.1, chloride 111,  bicarbonate 25, BUN 21, creatinine 1.1, glucose 99.   DISCHARGE VITAL SIGNS:  Temperature 97.9, respiratory rate 20, heart rate  72, blood pressure 123/68, O2 sat 100% on 2 liters.   FOLLOWUP APPOINTMENT:  Dr. Deborra [name] in her continuity clinic on December 04, 2003.  At that time needs to check on her CBC to follow up on hemoglobin  level, also need to check on her blood pressure status, need to check her B-  MET for creatinine level, and follow up on increment in her chest pain  secondary to post herpetic neuralgia, and status of gout flare up.       HP/MEDQ  D:  12/01/2003  T:  12/01/2003  Job:  [id number]    cc:   [name], M.D.  Fax: [phone]

## 2010-06-07 NOTE — H&P (Signed)
[name], LIER NO.:  [id number]   MEDICAL RECORD NO.:  [id number]          PATIENT TYPE:  INP   LOCATION:  NA                           FACILITY:  [facility]   PHYSICIAN:  Cynda Familia, M.D.DATE OF BIRTH:  05/19/1945   DATE OF ADMISSION:  07/14/2005  DATE OF DISCHARGE:                                HISTORY & PHYSICAL   CHIEF COMPLAINT:  End-stage osteoarthritis, left knee.   HISTORY OF PRESENT ILLNESS:  This is a 65 year old lady with a history of  end-stage osteoarthritis of bilateral knees, left greater than right, who  has had conservative treatment for her knees with injections,  supplementation, medications and therapy without any relief of her pain.  She has a complicated medical history with diabetes, coronary artery  disease, hypertension, history of congestive heart failure and has been  scheduled for surgery in the past, but had to cancel due to findings of  coronary artery disease and exacerbations of her congestive heart failure.  At this time, she has been cleared by her cardiologist and medical doctor  for her surgery and is scheduled for total knee arthroplasty of the left  knee.  She understands that with her significant medical problems that she  is at increased risk of heart attack, stroke, pulmonary complications,  exacerbation of congestive heart failure and possibly death.  She  understands and accepts these risks as she is having significant pain in her  knees and wants to get something done about this.  We will have medical  doctor team, Dr. Karlton Lemon and Dr. Doylene Canard, follow her along with [place] in the  hospital to help manage her and hopefully keep her out of trouble with her  medical problems.  Surgery to go ahead as scheduled.   ALLERGIES:  No known drug allergies.   CURRENT MEDICATIONS:  1.  Darvocet-N 100 one q.6h. p.r.n. pain.  2.  Hyzaar 50/12.5 1/2 tablet daily.  3.  Actos 45 mg one p.o. daily.  4.  Colchicine 0.6 mg one  daily.  5.  Torsemide 20 mg one b.i.d.  6.  Prednisone 5 mg one daily.  7.  Humulin 70/30 40 units b.i.d.  8.  Clonidine 0.1 mg nightly.  9.  Sular 10 mg one daily.  10. Neurontin 600 mg one daily.  11. Coreg 25 mg one b.i.d.  12. Albuterol 0.83 mg/ml b.i.d. p.r.n.  13. Atrovent 17 mcg p.r.n.   PAST MEDICAL HISTORY:  1.  Coronary artery disease.  2.  Gout.  3.  Hypertension.  4.  History of MI.  5.  History of congestive heart failure.  6.  Diabetes.  7.  Reflux.  8.  Asthma.   PAST SURGICAL HISTORY:  1.  Hiatal hernia repair.  2.  Angiography of coronary arteries.  3.  Hysterectomy.   SOCIAL HISTORY:  The patient is single.  She lives alone.  She is retired.  She does not smoke and does not drink.   FAMILY HISTORY:  Positive for hypertension, cancer, diabetes and  osteoarthritis.   REVIEW OF SYSTEMS:  NEUROLOGIC:  Positive for occasional  blurred vision and  history of black-out spells.  PULMONARY:  Positive for asthma, pneumonia,  shortness of breath with one-pillow orthopnea, negative for PND.  CARDIOVASCULAR:  Positive for MI, congestive heart failure and occasional  chest pain for which she uses nitroglycerin rarely.  GASTROINTESTINAL:  Positive for hiatal hernia and gastroesophageal reflux disease.  GENITOURINARY:  Negative for urinary tract infection difficulties.   PHYSICAL EXAMINATION:  VITAL SIGNS:  Blood pressure 180/80, respirations 20,  pulse 84 and regular.  GENERAL:  This is an obese, black female in no acute distress.  HEENT:  Head is normocephalic, nose patent, ears patent.  Pupils equal round  and reactive to light.  Throat without injection.  NECK:  Supple without adenopathy.  Carotids 2+ without bruits.  CHEST:  Clear to auscultation.  No rales or rhonchi.  Respirations 20.  HEART:  Regular rate and rhythm with a 2/6 systolic ejection murmur.  ABDOMEN:  Obese, soft, no palpable masses or organomegaly, but this  difficult due to her obesity.   NEUROLOGIC:  The patient is alert and oriented to time, place and person.  Cranial nerves 2-12 grossly intact.  EXTREMITIES:  Bilateral knees with decreased range of motion with pain  throughout the range of motion and crepitation.  X-rays show end-stage  osteoarthritis of the knees.  Her symptoms are worse in the left knee than  the right knee.  Her sensation and circulation are intact.  Her skin is in  good repair throughout the lower extremity with no diabetic foot ulcers or  skin breakdown.  Dorsalis pedis and posterior tibialis pulses are 1+.  X-  rays show end-stage osteoarthritis of the left knee.   IMPRESSION:  End-stage osteoarthritis, left knee.   PLAN:  Total knee arthroplasty of the left knee.      Judith Part. Chabon, P.A.    ______________________________  Cynda Familia, M.D.    SJC/MEDQ  D:  07/07/2005  T:  07/07/2005  Job:  [id number]

## 2010-06-07 NOTE — Cardiovascular Report (Signed)
[name], [name] NO.:  [id number]   MEDICAL RECORD NO.:  [id number]          PATIENT TYPE:  INP   LOCATION:  6708                         FACILITY:  [place]   PHYSICIAN:  Birdie Riddle, M.D.  DATE OF BIRTH:  March 26, 1945   DATE OF PROCEDURE:  04/11/2005  DATE OF DISCHARGE:  04/12/2005                              CARDIAC CATHETERIZATION   REFERRING PHYSICIAN:  Sydnee Cabal, MD   PROCEDURE:  Left heart catheterization, selective coronary angiography.   INDICATIONS:  This 65 year old black female with a positive nuclear stress  test showing large inferolateral ischemia has probably breast attenuation.  The patient has the symptoms of exertional dyspnea and hypertension.   APPROACH:  Right femoral artery using a 5-French sheath and catheters.   COMPLICATIONS:  None.   HEMODYNAMIC DATA:  The left ventricular pressure was 150 x 60, and aortic  pressure was 150 x 64 after 1.25 mg of IV Vasotec.   CORONARY ANATOMY:  The left main coronary artery was short and unremarkable.  Left anterior descending coronary artery:  The left anterior descending  coronary artery was unremarkable.  Left circumflex coronary artery:  The left circumflex coronary artery was  also unremarkable except for the large ramus branch and a very small obtuse  marginal branch.  Right coronary artery:  The right coronary artery was dominant and had a  large conus/marginal branch, and the posterolateral and posterior descending  coronary arteries were also unremarkable.   IMPRESSION:  1.  Normal coronaries.  2.  False positive nuclear stress test.   RECOMMENDATIONS:  This patient will continue medical therapy and may undergo  knee surgery next week as planned by Dr. Theda Sers.      Birdie Riddle, M.D.  Electronically Signed     ASK/MEDQ  D:  04/11/2005  T:  04/14/2005  Job:  [id number]

## 2010-06-07 NOTE — Assessment & Plan Note (Signed)
[facility]                           GASTROENTEROLOGY OFFICE NOTE   [name], [name]                      MRN:          [id number]  DATE:12/03/2005                            DOB:          07/13/1945    REFERRING PHYSICIAN:  Birdie Riddle, M.D.   The patient comes in as referred by Dr. Doylene Canard.  Primary care doctor is Dr.  Raliegh Ip. Shelton.  The patient describes some heartburn that has required some  Prevacid, some chest discomfort at times, lower abdominal pain, constipation  intermittently, sometimes has diarrhea and they alternate.  Says she has had  some bright red blood per rectum at times, has had some weight change.  Otherwise, she really did not have any other symptoms.  A colonoscopic  examination was done in 2002 and one small polyp was seen.  Otherwise, there  was no other problems or family history for GI disorders.  Basically is  noncontributory.   PAST MEDICAL HISTORY:  Reveals that she has had hypertension and heart  failure, hypothyroidism, arthritis, allergies, sinus trouble, sleep apnea.  She is status post cholecystectomy, hernia surgery, hysterectomy, tubal  ligation and heart valve surgery.   SOCIAL HISTORY:  Really noncontributory.   REVIEW OF SYSTEMS:  Reveals some anemia, voice change, increased thirst,  some shortness of breath, leaking of urine and some anxiety problems.   PHYSICAL EXAMINATION:  She is 5 foot 6.  Weighs 355.  Blood pressure 140/88,  pulse 64 and regular.  Neck and upper extremities are unremarkable.   IMPRESSION:  1. Abdominal pain of questionable etiology.  2. Gastroesophageal reflux disease.  3. Mild to moderate ventral hernia.  4. Status post colon polyps.  5. Morbid obesity.  6. Diabetes.   RECOMMENDATIONS:  She is to take Prevacid 1 q.a.m. and get a CT of her  abdomen and pelvis to make sure there is no problem with her ventral hernia  that is precipitating her symptoms.  Get her scheduled  for a colonoscopic  examination in the near future.  Keep her on Prevacid as we talked about.  I  think  certainly that irritable bowel is probably at significant play here.  I told her to call if she has any further difficulties.  We will see what  the results of her study show.    Clarene Reamer, MD  Electronically Signed   SML/MedQ  DD: 12/04/2005  DT: 12/05/2005  Job #: [id number]

## 2010-06-07 NOTE — H&P (Signed)
[name], [name] NO.:  [id number]   MEDICAL RECORD NO.:  [id number]          PATIENT TYPE:  INP   LOCATION:  0105                         [facility]ACILITY:  [facility]   PHYSICIAN:  Edythe Lynn, M.D.       DATE OF BIRTH:  11-28-1945   DATE OF ADMISSION:  12/27/2004  DATE OF DISCHARGE:                                HISTORY & PHYSICAL   PRIMARY CARE PHYSICIAN:  Royetta Crochet. Karlton Lemon, M.D.   CHIEF COMPLAINT:  Shortness of breath.   HISTORY OF PRESENT ILLNESS:  [name] is a 65 year old African-American  woman with multiple medical problems who presented today to [facility]  emergency room with progressive shortness of breath, pain all over,  inability to move.  She had a prolonged hospitalization in August of 2006  that resulted in a discharge to a skilled nursing facility.  Apparently, now  the patient came from home.  [name] is somewhat lethargic, and is not  able to provide a full and complete history.   PAST MEDICAL HISTORY:  From records, and indicates the patient has -  1.  Diabetes mellitus type 2.  2.  Obesity.  3.  Hypertension.  4.  Chronic kidney disease with a baseline creatinine of 1.3.  5.  Polymyalgia rheumatica.  6.  Obstructive sleep apnea.  7.  Gout.  8.  Congestive heart failure.  9.  Anemia.   FAMILY HISTORY:  Noncontributory.   SOCIAL HISTORY:  [name] lives with her granddaughter.  She does not drink  alcohol.  She does not smoke cigarettes.  She is currently disabled.   MEDICATIONS AT HOME:  1.  Elavil 75 mg at bedtime.  2.  Torsemide 20 mg daily.  3.  Allopurinol 100 mg daily.  4.  Calcium one tablet daily.  5.  Aspirin 81 mg daily.  6.  Prevacid 30 mg daily.  7.  Hyzaar 100/25 mg daily.  8.  Neurontin 600 mg twice a day.  9.  Colchicine 0.6 mg daily.  10. Coreg, unknown dose.  11. Ramipril, unknown dose.  12. Sular, unknown dose.   REVIEW OF SYSTEMS:  The patient has some abdominal pain in the left lower  quadrant.  She  also has some leg pain, back pain, chest pain.  No headache.  She is coughing a little bit.  Her other systems are per HPI.   PHYSICAL EXAMINATION UPON ADMISSION:  VITAL SIGNS:  Temperature is 101.7,  pulse 120, respirations 23, blood pressure 182/91, saturating 95% on 2  liters of oxygen.  GENERAL:  [name] is obese, alert, lethargic, in moderate distress.  She  is oriented to place, person and time.  NECK:  Thick and short.  HEENT:  Head is normocephalic and atraumatic.  Eyes - pupils equal, round  and reactive to light and accommodation.  Extraocular movements are intact.  CHEST:  Very poor air movement, bilateral rhonchi.  No wheezes and no  crackles.  HEART:  Regular rate and rhythm without murmurs, rubs, or gallops.  Extremely distant sounds.  ABDOMEN:  Soft, obese, with decreased bowel  sounds and some diffuse  tenderness, a bit more pronounced to the left lower quadrant.  SKIN:  Warm and dry.  EXTREMITIES:  Bilateral +3 lower extremity edema.  [name] seems to be  able to move all 4 extremities, although she has pronounced lower extremity  weakness, bilaterally symmetric.   LABORATORY DATA:  Laboratory values at the time of admission revealed a  white blood cell count of 11,000, hemoglobin 10, platelet count 208.  Urinalysis is negative for leukocyte esterase.  Positive for protein.  Positive for glucose.  BNP is 88.  CK-MB is 0.9, normal x2.  Sodium 131,  potassium 4.2, chloride 98, bicarbonate 24, BUN 22, creatinine 1.9, glucose  510.  Portable chest x-ray shows bilateral air space disease.  EKG shows  sinus tachycardia, no acute ST-T changes.   ASSESSMENT AND PLAN:  1.  Febrile illness.  It appears that [name] does have some significant      pneumonia at this point.  The plan is to obtain blood cultures, recheck      a chest x-ray in the morning, and start treatment empirically with      Rocephin and Zithromax.  At the moment, [name] is not coughing enough       and is not producing sputum to be able to culture the sputum.  Will also      culture the urine to rule out pyelonephritis, and will also be following      closely her abdominal exam, as she is complaining of left lower quadrant      pain, which could indicate acute diverticulitis.  2.  Non-ketotic hyperosmolar syndrome.  [name] is presenting in a      hyperosmolar state due to medication noncompliance.  She stopped taking      insulin for the past days.  The plan is to gently hydrate her overnight      and proceed with frequent subcutaneous NovoLog administration, together      with NPH every 12 hours.  3.  Chronic kidney disease with nephrotic syndrome.  Will hold for now the      ACE inhibitor, since [name] is having an acute illness, continue the      diuretic and the beta blocker, and closely monitor her creatinine      function.  4.  Deep vein thrombosis prophylaxis will be undertaken using Lovenox.  5.  History of gout.  Will continue the patient's allopurinol on a daily      basis.      Edythe Lynn, M.D.  Electronically Signed    SL/MEDQ  D:  12/27/2004  T:  12/27/2004  Job:  [id number]   cc:   Royetta Crochet. Karlton Lemon, M.D.  Fax: [phone]

## 2010-06-07 NOTE — Discharge Summary (Signed)
NAMERONNICA, SCHRIMSHER NO.:  [id number]   MEDICAL RECORD NO.:  [id number]          PATIENT TYPE:  INP   LOCATION:  6708                         FACILITY:  [facility]   PHYSICIAN:  Birdie Riddle, M.D.  DATE OF BIRTH:  1945/12/09   DATE OF ADMISSION:  04/11/2005  DATE OF DISCHARGE:  04/12/2005                                 DISCHARGE SUMMARY   PRINCIPAL DIAGNOSES:  1.  Left leg osteoarthritis.  2.  Congestive heart failure.  3.  Abnormal cardiovascular study.  4.  Hypertension.  5.  Diabetes mellitus type 2.  6.  Morbid obesity.  7.  Gout.  8.  Sleep apnea.   PHYSICAL PROCEDURE:  Left cardiac catheterization and left coronary  angiography done by Dr. Dixie Dials on April 11, 2005.   DISCHARGE MEDICATIONS:  1.  Potassium chloride 20 mEq one daily.  2.  Gabapentin 600 mg three times daily.  3.  Torsemide 20 mg twice daily.  4.  Colchicine 0.6 mg daily.  5.  Coreg 2.5 mg twice daily.  6.  Prednisone 20 mg one daily.  7.  Amitriptyline 75 mg at bedtime.  8.  Allopurinol 100 mg one daily.  9.  Sular 30 mg daily.  10. Prevacid 30 mg daily.  11. Albuterol metered-dose inhaler two puffs four times daily as needed.  12. Lantus 40 units subcutaneously twice daily.  13. Darvocet-N 100 up to four times daily as needed.  14. Tramadol 50 mg four times daily as needed.  15. Aspirin 81 mg one daily.   FOLLOWUP:  By Dr. Dixie Dials in one month and by Dr. Carolynn Comment in one week.   DISCHARGE DIET:  Low-fat, low-salt, diabetic diet.   ACTIVITY:  The patient is to increase activity slowly.   WOUND CARE:  The patient is notified about wound care on discharge.   HISTORY:  A 65 year old black female presented to [facility] area for a left total knee replacement.  The patient was found to  have a positive Cardiolite study.  Initially the Cardiolite study was of low  probability but it became moderate probability for ischemia on final report  hence the  patient was explained the cardiac catheterization procedure and  she was transferred to [facility] for semi-urgent heart  catheterization.  Her cardiac catheterization showed normal coronaries  implying false-positive nuclear stress test.  The patient was cleared for  her knee surgery and she was advised to take her medications regularly and  stay in touch with her orthopedic doctor plus followup by me in one month.      Birdie Riddle, M.D.  Electronically Signed     ASK/MEDQ  D:  07/10/2005  T:  07/10/2005  Job:  [id number]

## 2010-06-07 NOTE — H&P (Signed)
NAMEMIRJANA, BRUNELL NO.:  [id number]   MEDICAL RECORD NO.:  [id number]          PATIENT TYPE:  INP   LOCATION:  4734                         FACILITY:  [facility]   PHYSICIAN:  Birdie Riddle, M.D.  DATE OF BIRTH:  09-09-1945   DATE OF ADMISSION:  11/24/2005  DATE OF DISCHARGE:                                HISTORY & PHYSICAL   CHIEF COMPLAINT:  A 20 to 30 pound weight gain in the last two weeks along  with leg edema and shortness of breath.   HISTORY OF PRESENT ILLNESS:  This 65 year old black female noticed 20 to 30  pound weight gain over the last two weeks along with leg swelling and  shortness of breath on exertion and two-pillow orthopnea.  The patient also  has occasional chest pain.  Denies any cough, fever, nausea, vomiting, or  abdominal pain.   PAST MEDICAL HISTORY:  1. Positive for diabetes for 25 plus years.  2. Positive for hypertension for three years.  3. She quit smoking 20 years ago.  She quit alcohol 20 years ago.  Denies      any drug use and does not have a history of elevated cholesterol level.      Had myocardial infarction x1 in 1989.  Has significant obesity.  No      history of exercise,  and has a positive family history for premature      coronary artery disease.   PAST SURGICAL HISTORY:  1. Stomach surgery for an ulcer in 1970.  2. Hysterectomy in 1989.  3. Cardiac catheterization in 1980 and again in 2006, both were normal.  4. Back surgery x3.   MEDICATIONS:  1. Coreg 25 mg 1 twice daily.  2. Gabapentin 600 mg 3 times daily.  3. Aspirin 81 mg 4 daily.  4. Hyzaar 50/ 12.5 mg 1 daily.  5. Altace 5 mg 1 daily.  6. Colchicine 0.6 mg 1 twice daily.  7. Allopurinol 300 mg 1 daily.  8. Sular 10 mg 1 daily.  9. Amitriptyline 75 mg at bedtime.  10.Calcium 600 mg daily.  11.Docusate sodium 100 mg daily.  12.Humulin 70/30 insulin 60 units in the morning and 50 units in the      evening.  13.Oxygen 2 liters per minute by nasal  cannula.   ALLERGIES:  None.   PERSONAL HISTORY:  The patient is widowed.  Husband died two years ago at  age 38 with brain tumor.  The patient has five kids.  One died of a gunshot  wound in [place] at age 107.  The patient lives alone.  He is retired for  ten plus years and disabled from that condition.   FAMILY HISTORY:  Mother died of hypertension at age 87.  Father died of  myocardial infarction in his 98s.  Has three brothers, one died in infancy,  oldest brother 75 died of cancer of the pancreas in 2005.  Has one sister  living age 95, has cancer.   REVIEW OF SYSTEMS:  The patient admits to weight gain of 30 pounds in two  weeks.  Has vision change but does not wear glasses.  History of cataract  surgery.  Has bilateral hearing loss.  No history of tinnitus.  Positive  history of rhinorrhea.  Positive history of dentures, full upper and lower.  Has no history of cough, hemoptysis, asthma, pneumonias.  Positive history  of dyspnea.  No history of palpitations.  Positive history of dizziness,  chest pain, leg edema, and negative history of claudication, nausea,  vomiting, diarrhea, GI bleed, hiatal hernia, hepatitis.  Positive history of  constipation, drug transfusion, stomach cancer.  No history of kidney stone,  hematuria, strokes, seizures, or psychiatric admissions.  Positive history  of joint pains with recent left knee surgery.  Positive history of rash,  shingles in September 2005.  Immunization up to date with pneumonia shot two  years ago and flu shot two weeks ago.   PHYSICAL EXAMINATION:  GENERAL:  The patient is an averagely built, well-  nourished black female in mild respiratory distress.  VITAL SIGNS:  Pulse 84, respirations 20, blood pressure 172/90, height 5  feet 11 inches, weight 354 pounds.  The patient is alert, oriented x3.  Temperature 95.2.  HEAD:  Normocephalic, atraumatic, and has gray hair.  EYES:  [place]ark with bilateral lens haziness.  Wears reading  glasses.  EAR, NOSE, THROAT:  Mucous membranes pink and moist.  The patient is  edentulous with occasional partial upper dentures.  NECK:  No JVD, no carotid bruit.  Has palpable thyroid with mild prominence.  Full range of motion of the neck.  LUNGS:  Bilateral basilar crackles.  HEART:  Normal S1, S2.  Grade 3/6 systolic murmur.  ABDOMEN:  Soft, distended, but nontender.  EXTREMITIES:  No cyanosis or clubbing, 2+ edema, extending up to the mid-  thigh.  Has mild skin discoloration bilaterally.  Has a vertical scar of  surgery over the left knee.  CNS:  Cranial nerves grossly intact.  Unequal grips with right-sided  weakness.  Numbness extends to both hands and the right forearm.   LABORATORY DATA:  Pending.  Chest x-ray pending.   ASSESSMENT:  1. Congestive heart failure, left heart failure.  2. Hypertension.  3. Diabetes mellitus.  4. Morbid obesity.  5. Gout.  6. Bilateral leg edema.   PLAN:  Plan is to admit the patient to 4700 unit, give IV Lasix, get  admission blood work, continue home medications which includes ACE  inhibitors and a beta blocker.      Birdie Riddle, M.D.  Electronically Signed     ASK/MEDQ  D:  11/24/2005  T:  11/25/2005  Job:  [id number]

## 2010-06-07 NOTE — Consult Note (Signed)
[name], [name] NO.:  [id number]   MEDICAL RECORD NO.:  [id number]                   PATIENT TYPE:  INP   LOCATION:  2037                                 FACILITY:  [facility]   PHYSICIAN:  Darene Lamer. Hoxworth, M.D.          DATE OF BIRTH:  [birth year]   DATE OF CONSULTATION:  09/29/2003  DATE OF DISCHARGE:                                   CONSULTATION   CONSULTING PHYSICIAN:  Marland Kitchen T. Hoxworth, M.D.   CHIEF COMPLAINT:  Headache, syncope, joint pain.   HISTORY OF PRESENT ILLNESS:  [name] is a 65 year old black female  admitted to the teaching service, on September 25, 2003, due to chest pain  and shortness of breath.  She has multiple chronic medical problems as  detailed below.  There was concern over a pulmonary embolus as she was also  having leg pain but this has been ruled out with negative lower extremity  Dopplers and a spiral CT of the chest.  The patient, however, complains of  multiple joint pain in upper and lower extremities as well as transient  chest and abdominal pain, also has had headaches severe at times, worse on  the left than the right.  This has been going on for several months.  The  headaches sometimes culminate in syncopal episodes.  Her workup has included  a markedly elevated ESR.  Concern has been raised over temporal arteritis  and temporal artery biopsy has been requested.   PAST MEDICAL HISTORY:  Surgery:  1.  Hiatal hernia repair.  2.  Umbilical hernia repair in remote past.  3.  She has also had a TAH-BSO.   She is followed for multiple medical problems including:  1.  Ischemic congestive heart failure.  2.  Hypertension.  3.  Diabetes mellitus.  4.  Chronic renal insufficiency.  5.  Gout.  6.  Osteoarthritis.  7.  Morbid obesity.   CURRENT MEDICATIONS:  1.  Lovenox 40 mg at h.s.  2.  Cozaar 50 mg daily.  3.  Coreg 6.25 b.i.d.  4.  Protonix 40 mg daily.  5.  Lasix 40 mg b.i.d.  6.  Avelox 400  mg daily.  7.  Insulin 70/30, 60 units a.c.  8.  Lactulose.  9.  Recently started on prednisone 60 mg daily, now tapered to 40 mg daily.  10. Tylox p.r.n. for pain.   ALLERGIES:  ACE INHIBITORS secondary to hypotension.   SOCIAL HISTORY:  Denies cigarettes or alcohol.   FAMILY HISTORY:  Positive for hypertension, stroke.   REVIEW OF SYSTEMS:  GENERAL:  No fever, chills.  NEUROLOGIC:  Positive for  severe headaches as described above plus syncopal episodes.  RESPIRATORY:  Positive for episodic shortness of breath and some wheezing.  CARDIOVASCULAR:  Positive for episodic left sided chest pain associated with  shortness of breath.  GI:  Positive for occasional lower abdominal pain.  MUSCULOSKELETAL:  Positive for joint pain, particularly left knee, left  ankle, left hip, also other joints sporadically.   PHYSICAL EXAMINATION:  VITAL SIGNS:  Temperature is 99, pulse 92, blood  pressure 142/88, respirations 20.  GENERAL:  A morbidly obese black female, alert, no acute distress.  SKIN:  No rash or infection.  HEENT:  No icterus.  No masses.  LUNGS:  Mild increased work of breathing.  Minimal wheezing.  ABDOMEN:  Obese, soft, nontender.  NEUROLOGIC:  Alert, fully oriented, no gross focal deficits.  There is some  definite tenderness over the left temporal artery.   LABORATORY:  Urinalysis shows mild to moderate leukocyte esterase.  Chemistry is abnormal for a creatinine of 3.3, BUN of 56 from 2.5 creatinine  yesterday.  Hemoglobin decreased to 6.9 today, receiving a transfusion.  White count 8,000.  ANA is pending.  White count normal.  ESR markedly  elevated at 129.   ASSESSMENT/PLAN:  A 65 year old black female with multiple medical problems  and also complaints of headaches, left greater than right, syncope, multiple  arthralgias, and elevated ESR.  Temporal artery biopsy to rule out temporal  arteritis would seem indicated.  This will be planned within the next couple  of days.   The procedure was discussed with the patient.                                               Darene Lamer. Hoxworth, M.D.    [place]  D:  09/29/2003  T:  09/30/2003  Job:  [id number]

## 2010-06-07 NOTE — Cardiovascular Report (Signed)
[facility]  Patient:    [name], [name]                        MRN: [id number] Proc. Date: 02/20/99 Attending:  Barnett Abu, M.D. CC:         [name]., M.D.             Charleston Ropes, M.D., Internal Medicine Service, [facility]             Cardiac Catheterization Laboratory                        Cardiac Catheterization  CINE NUMBER:  [id number]  REFERRING PHYSICIAN:  Dr. Arelia Longest. Michelle [name].  PROCEDURE PERFORMED: 1. Right and left heart catheterization. 2. Coronary angiography. 3. Left ventriculogram.  INDICATIONS:  [name] is a 65 year old, obese, diabetic, hypertensive woman with a history of obstructive sleep apnea and possible CAD.  She has been admitted with symptoms consistent with crescendo angina pectoris and evidence of congestive heart failure/pulmonary edema.  A myocardial infarction has been ruled out.  She is brought to the catheterization laboratory to identify the extent of disease and  provide for further therapeutic options.  DESCRIPTION OF PROCEDURE:  The patient was brought to the cardiac catheterization laboratory in the postabsorptive state.  The right groin was prepared and draped in the usual sterile fashion.  Local anesthesia was obtained with the infiltration of 0.1% lidocaine.  A 6 French catheter sheath was inserted percutaneously into the right femoral artery utilizing an anterior approach over a guiding J wire.  The  patient then received 1500 units of heparin intravenously.  A 110 cm pigtail catheter was advanced to the ascending aorta where the pressure was recorded and then prolapsed across the aortic valve into the left ventricle where the pressure was again recorded both prior to and following the ventriculogram.  A 30 degree RAO cine left ventriculogram was performed using power injector.  Then 45 cc of nonionic contrast material was injected at 13 cc/sec.  Right and left  coronary angiography was then performed using right and left 6 French #4 Judkins catheters. Cineangiography of each coronary artery was conducted in multiple LAO and RAO projections.  All catheter manipulations were performed using fluoroscopic observation and exchanges performed over a long guiding J wire.  At the completion of the left heart catheterization we proceeded with a right heart catheterization. A 8 French catheter sheath was inserted percutaneously into the right femoral vein utilizing an anterior approach over a guiding J wire.  A 7.5 French Swan-Ganz balloon flow-directed catheter was advanced through the right heart chambers to the pulmonary artery wedge position.  Pressure was recorded with a catheter in the pulmonary wedge position, the main pulmonary artery, the right ventricle, and the right atrium.  Blood samples for oxygen saturations determination were obtained  from the superior vena cava, the main pulmonary artery and the femoral artery. The bladder was not done simultaneously.  At the completion of the right heart catheterization, the catheter and catheter  sheaths were removed.  Hemostasis was achieved by direct pressure.  The patient was transported to the ward in stable condition with intact distal pulses.  FLUOROSCOPY TIME:  5.8 minutes.  TOTAL CONTRAST UTILIZED:  Omnipaque 140 cc.  HEMODYNAMICS:  Utilizing the Fick principal and an estimated oxygen consumption of is 267 ml/min, the Fick calculated cardiac output was is  6.7 L/min. with a cardiac index of 2.6 L/min. per m sq.  There was no systolic gradient across the aortic  valve nor were there a diastolic gradient across the mitral valve.  Right heart  pressures were mildly elevated.  Right atrial pressure showed an A wave of 22 and V wave of 18 and a mean pressure of 15 mmHg.  The right ventricular pressure was 50/15 mmHg.  The pulmonary artery pressure was 54/28 with a mean of 33  mmHg. The pulmonary capillary wedge pressure was A wave of 22, V wave of 21, mean of 18 mmHg. The left ventricular end-diastolic pressure was 22 mmHg.  This was not obtained  simultaneously with the pulmonary artery wedge pressure.  Arterial pressure was  160/100 mmHg with a mean of 118 mmHg.  ANGIOGRAPHY:  LEFT VENTRICULOGRAM:  The left ventriculogram demonstrated normal chamber size nd overall normal global systolic function.  There was concentric hypertrophy seen of a moderate nature.  Visual estimation of the left ventricular ejection fraction is 60-65%.  Frequent ectopy prevented exact calculation.  There was no significant  mitral regurgitation and no coronary calcification.  CORONARY ANGIOGRAPHY:  There was a right dominant coronary system present.  The main left coronary artery was normal.  The left anterior descending:  The left anterior descending artery and its branches were normal.  Left circumflex:  The left circumflex artery and its branches were normal.  Right coronary artery:  The right coronary artery and its branches were normal.   Collateral vessels were not seen.  FINAL DIAGNOSES: 1. Mild to moderate elevation of right heart pressures/pulmonary hypertension. 2. Mild to moderate elevation of left ventricular end-diastolic pressure. 3. Left ventricular hypertrophy. 4. Intact global systolic function. 5. Normal coronary arteries.  RECOMMENDATIONS:  It would appear that [name] recent symptoms are not due to epicardial coronary ischemia.  She may have had endocardial ischemia from hypertension, LVH and volume overload.  The volume overload is undoubtedly due o her obstructive sleep apnea which has then led to pulmonary edema.  I recommend  continued diuresis in an aggressive fashion until BUN and creatinine start to rise. Would encourage routine use of her CPAP and consideration of possible BiPAP. Aggressive treatment of her hypertension of  course is important to prevent further left ventricular hypertrophy. DD:  02/20/99 TD:  02/20/99 Job: [id number]

## 2010-06-07 NOTE — H&P (Signed)
[name] NO.:  [id number]   MEDICAL RECORD NO.:  [id number]           PATIENT TYPE:   LOCATION:                                 FACILITY:   PHYSICIAN:  [name], M.D.DATE OF BIRTH:  30-Nov-1945   DATE OF ADMISSION:  12/19/2004  DATE OF DISCHARGE:                                HISTORY & PHYSICAL   CHIEF COMPLAINT:  Left knee end-stage osteoarthritis.   HISTORY OF PRESENT ILLNESS:  This is a 65 year old lady with a history of  end-stage osteoarthritis of her bilateral knees, left greater than right.  She has had continued pain in her knees that has failed to respond to  cortisone injections and therapy.  She uses a cane, yet still has multiple  episodes of the knee catching, locking, and giving way, causing her to fall.  She has failed conservative measures to manage her pain and instability in  falling, and after discussion of treatment options, now opts for total knee  arthroplasty of her left knee.  She has multiple medical problems, including  hypertension, diabetes, history of congestive heart failure.  She is also  morbidly obese.  She understands that she is at increased risk of  complications postoperatively, including DVT with PE, infection, and  possibly cardiac problems that could result in her death.  She understands  the increased risk but due to her continued pain and inability to ambulate  without her knee catching, locking, and causing her to fall, she wishes to  proceed with surgery.  She has had medical clearance by her medical doctors,  [name], and by her cardiologist, [name].  The  surgery will go ahead as scheduled under spinal anesthesia.   PAST MEDICAL HISTORY:  1.  Congestive heart failure.  2.  Hypertension.  3.  Diabetes.  4.  Gout.  5.  Obesity.   PAST SURGICAL HISTORY:  1.  Hiatal hernia.  2.  Heart catheterization.   DRUG ALLERGIES:  None.   CURRENT MEDICATIONS:  1.  Coreg  25 mg b.i.d.  2.  Gabapentin 600 mg t.i.d.  3.  Aspirin 81 mg daily, which she has stopped.  4.  Cozaar 50/12.5 1 daily.  5.  Altace 5 mg daily.  6.  Colchicine 0.6 mg b.i.d.  7.  Allopurinol 300 mg daily.  8.  Solar 10 mg daily.  9.  Elavil 75 mg q.h.s.  10. Humulin 7/30 60 units q.a.m. and 50 units q.p.m.  11. She uses oxygen 2 liters via nasal cannula full time.   FAMILY HISTORY:  Positive for hypertension, cancer, and diabetes.   SOCIAL HISTORY:  Patient lives alone.  She is widowed.  She does not smoke  and does not drink.   REVIEW OF SYSTEMS:  CENTRAL NERVOUS SYSTEM:  Negative for headache, blurred  vision or dizziness.  PULMONARY:  Positive for exertional shortness of  breath and two pillow orthopnea.  Negative PND.  CARDIOVASCULAR:  Positive  for a history of congestive heart failure with a history of sensation of  chest heaviness that has been worked  up with cardiac cath.  GI:  Negative  for ulcers or hepatitis.  GU:  Negative for urinary tract difficulties.  MUSCULOSKELETAL:  Positive, as in HPI.   PHYSICAL EXAMINATION:  VITAL SIGNS:  BP 140/88, respirations 18, pulse 88  and regular.  GENERAL APPEARANCE:  This is an obese lady in no acute distress.  HEENT:  Head is normocephalic.  Nose patent.  Ears patent.  Pupils are  equal, round and reactive to light.  Throat without injection.  NECK:  Supple without adenopathy.  Carotids 2+ without bruits.  LUNGS:  Clear to auscultation.  No rales or rhonchi.  Respirations 18.  HEART:  Regular rate and rhythm at 88 beats per minute without murmur,  gallop or rub.  ABDOMEN:  Obese.  Bowel sounds are active.  There are no bruits,  organomegaly, or masse noted.  EXTREMITIES:  Bilateral knees with varus deformity.  She has end-stage  osteoarthritis of the left knee.  Range of motion shows 2 degrees from full  extension with further flexion to 105.  Dorsalis pedis and posterior tibial  pulses are 2+.  NEUROLOGIC:  Patient is alert  and oriented to time, place, and person.  Cranial nerves II-XII are grossly intact.   X-rays show end-stage osteoarthritis of the left knee.   IMPRESSION:  End-stage osteoarthritis of the left knee.   PLAN:  Total knee arthroplasty, left knee.      [name], P.A.    ______________________________  [name], M.D.    SJC/MEDQ  D:  11/25/2004  T:  11/25/2004  Job:  [id number]

## 2010-06-07 NOTE — Discharge Summary (Signed)
[name], NIBARGER NO.:  [id number]   MEDICAL RECORD NO.:  [id number]                   PATIENT TYPE:  INP   LOCATION:  5004                                 FACILITY:  [facility]   PHYSICIAN:  Vickki Hearing, M.D.                  DATE OF BIRTH:  Dec 02, 1945   DATE OF ADMISSION:  04/27/2002  DATE OF DISCHARGE:  05/08/2002                                 DISCHARGE SUMMARY   PRIMARY MEDICAL PHYSICIAN:  Sharlet Salina, M.D.   DISCHARGE DIAGNOSES:  1. Acute on chronic renal failure.  2. Diarrhea.  3. Anemia.  4. Left lower extremity gout.   DISCHARGE MEDICATIONS:  1. Aspirin 325 mg p.o. daily.  2. Protonix 40 mg p.o. daily.  3. Colchicine 0.6 mg p.o. b.i.d.  4. Furosemide 60 mg p.o. b.i.d.  5. Prednisone taper.   BRIEF ADMISSION HISTORY:  This is a 65 year old African American female with  multiple medical problems consisting of diabetes mellitus, hypertension,  chronic renal failure, and anemia who presented to [facility] Emergency  Department with history of dizziness, feeling sick for two days.  She felt  the room was spinning when she got up from a chair and she almost fell down.  She complains of diarrhea several days prior to admission averaging about 3-  4 stools per day and watery.  There was no blood associated with this.  There is no history of sick contacts, no history of eating out.  No history  of travel out of the country.  She does complain of crampy abdominal pain.  She does complain of pain in the right side of her back for approximately  three days.  There is no change in her urine output.  There is no burning on  micturition.  There is no blood in her urine.  She denies fevers, NSAID use,  or over-the-counter medications.   BRIEF HOSPITAL COURSE:  Problem 1:  ACUTE ON CHRONIC RENAL FAILURE:  The  patient presents with a history as outlined above.  Her initial BUN and  creatinine were found to be 138 and 5.3.  Her baseline  creatinine was found  to be somewhere around 1.7.  Several etiologies were probably confounding in  her diagnosis.  She was found to be prerenal with a low pheno value.  Consequently, her Lasix dosage was held.  She had still been taking this  prior to admission.  We continued to carefully hydrate her over the next few  days and her creatinine responded appropriately.  A renal ultrasound was  obtained while in the hospital, which was normal.  A CT of the abdomen and  pelvis showed no hydronephrosis or no nephrolithiasis.  Gentle hydration was  the key to her renal function improvement.  She will be discharged with a  creatinine of 1.2 on April 18.  Of note, prior to discharge her Lasix had  been restarted at a lower dosage.  She was on Lasix 60 mg p.o. b.i.d.   Problem 2:  DIARRHEA:  The patient had a history of this several days prior  to admission.  The most likely etiology of her diarrhea was gastroenteritis.  While in the hospital, her diarrhea gradually resolved and she was continued  on the IV rehydration.  C. difficile stool culture and C. difficile in stool  which were unrevealing.  Of note, her colchicine was held at admission due  to a possible complicating factor of this with her diarrhea.  At the time of  discharge, she was stable and this issue fully resolved.   Problem 3:  ANEMIA:  This has been a chronic problem for the patient.  She  has a baseline hemoglobin of approximately 9-10.  Of note, she had a recent  colonoscopy done in November 2003 which did not reveal any malignancy.  Her  hemoglobin level remained stable while in the hospital with the low value  being approximately 7.7.  On the day of discharge, her hemoglobin level was  9.3.  This can be further monitored as an outpatient.   Problem 4:  DIABETES MELLITUS:  This is a significant problem for the  patient, as noted by her huge insulin requirement.  She was initially placed  on 70 units b.i.d.  The reasoning for  this was in the acute setting of renal  failure.  Her capillary blood glucose levels were followed throughout the  hospitalization and remained in the 300-400s usually.  Upward titration was  used on her b.i.d. insulin regimen to a value of 110 units b.i.d.  This  improved her fasting blood sugars to 144 on the day of discharge.  Further  modification can be made as an outpatient.  Of note, her Avandia was held  due to concern over fluid overload in a patient with CHF and known ejection  fraction of 35%-45%.   Problem 5:  GOUT:  The patient has a known history of gout, for which she is  maintained on chronic colchicine therapy.  Due to her diarrhea and volume  depletion, this was held initially at time of presentation.  A few days into  her hospitalization, she started having gout symptoms.  At that time, the  colchicine was restarted; however, her flare was fairly significant at this  hospital stay.  Consequently, prednisone 30 mg p.o. b.i.d. was added to her  regimen.  She experienced gradual improvement over the next few days and  will be discharged to [facility] for further rehabilitation from her gout attack.   PAIN MANAGEMENT:  Percocet 5/325 one or two tablets q.4h. p.r.n.   ACTIVITY:  The patient will be discharged to [facility] for rehabilitation.  Physical and occupational therapy will work with her in this rehab unit.   DIET:  The patient was advised to use American Diabetic Association diet.  Carbohydrate modified moderate.   FOLLOW UP:  At the time of discharge from [facility], an appointment will be made  for followup with Dr. Sharlet Salina.                                               Vickki Hearing, M.D.    AK/MEDQ  D:  05/08/2002  T:  05/09/2002  Job:  [id number]   cc:   Sharlet Salina, M.D.  [name], [place] [address]  Fax: [phone]

## 2010-06-07 NOTE — H&P (Signed)
   [name], [name] NO.:  [id number]   MEDICAL RECORD NO.:  [id number]                   PATIENT TYPE:  INP   LOCATION:  5004                                 FACILITY:  [facility]   PHYSICIAN:  Vickki Hearing, M.D.                  DATE OF BIRTH:  January 23, 1945   DATE OF ADMISSION:  05/08/2002  DATE OF DISCHARGE:  05/12/2002                                HISTORY & PHYSICAL   DISCHARGE DIAGNOSES:  This discharge summary is from her [facility] admission  corresponding from May 08, 2002, to May 12, 2002.   ADDENDUM:  Rehab.  The patient contributed in all of the rehabilitation therapies.  She  did extremely well and was discharged on April 22, back to home.   Hypertension.  After her acute renal failure and volume depletion resolved,  she was gradually restarted on her Lasix and Cozaar therapy. The last few  days of her hospital, her blood pressure ranged from 150 to 160 over 80 to  90, however, her Cozaar had just recently been started one to two days prior  to discharge. Consequently, atenolol was not restarted at that time, but if  her blood pressure remains high, it can be adjusted as an outpatient.   Gout flare.  The patient's gout flare tremendously improved while in the  [facility]. She was ambulating and doing well on her own. She was continuing on  the prednisone taper and was scheduled to finish on May 15, 2002.  She  also continued on her Colchicine 0.6 mg p.o. b.i.d.                                               Vickki Hearing, M.D.    AK/MEDQ  D:  05/12/2002  T:  05/12/2002  Job:  [id number]   cc:   Sharlet Salina, M.D.  [address]-Internal Medicine Resident  [place], [place] [address]  Fax: [phone]

## 2010-06-07 NOTE — Discharge Summary (Signed)
[facility]  Patient:    [name], [name] Visit Number: [id number] MRN: [id number]          Service Type: MED Location: [phone] Attending Physician:  De Burrs Dictated by:   Earley Brooke, M.D. Admit Date:  01/26/2001 Discharge Date: 01/29/2001   CC:         Sharlet Salina, M.D.  Internal Medicine Outpatient Clinic   Discharge Summary  DISCHARGE DIAGNOSES:  1. Congestive heart failure (patient has left heart diastolic as well as     systolic failure with subsequent right-sided heart failure).  2. History of hypertension.  3. History of obstructive sleep apnea.  4. Lower extremity lymphedema.  5. Pulmonary hypertension.  6. Patient is home oxygen dependent on 2 liters nasal cannula.  7. C-PAP at night.  8. History of normocytic anemia.  9. Chronic low back pain status post surgery, 1988, at [facility]. 10. Status post total abdominal hysterectomy and bilateral     salpingo-oophorectomy. 11. Status post umbilical hernia repair. 12. Diabetes mellitus Type II with peripheral neuropathy. 13. Status post left fibular stress fracture in December 2001. 14. Status post hiatal hernia repair. 15. History of tinea pedis. 16. History of trochanteric bursitis. 17. History of acute pancreatitis secondary to ACE inhibitor use. 18. History of osteoarthritis in the knees.  DISCHARGE MEDICATIONS:  The following medications have been changed or added.  1. Lasix 120 mg p.o. b.i.d.  2. K-Dur 20 mEq p.o. q.d.  3. Imdur 30 mg p.o. b.i.d.  The following medications will be continued.  1. Aspirin 325 mg p.o. q.d.  2. Atenolol 50 mg p.o. q.d.  3. Insulin 70/30 100 units b.i.d.  4. Ibuprofen 800 mg 1 tablet q6h p.r.n. pain.  5. The patient has been advised to stop taking glucophage.  FOLLOW-UP:  1. Patient will follow up in the [facility] for hospital follow up on     Friday, February 05, 2001, at 1:45 PM to Dr. Earley Brooke.  At that  time the patient will need a repeat basic metabolic panel as well as     check of her blood pressure and her lower extremity edema.  2. Patient already has a scheduled appointment with Dr. Sharlet Salina,     her primary care doctor for Thursday, March 04, 2001, at 9 AM in     the internal medicine outpatient clinic.  PROCEDURES AND DIAGNOSTIC STUDIES:  1. Patient had lower extremity Dopplers performed on January 28, 2001     with the impression being no obvious deep vein thrombosis or superficial     venous thrombosis or Bakers cyst; however, the Dopplers were technically     difficult to do secondary to the patients large body habitus.  2. Patient had a transthoracic echocardiogram performed on January 28, 2001     with the impression being:     a. ejection fraction 35-45%,     b. patient had an estimated aortic valve area of 1.47 centimeters        squared,     c. there was mild mitral valvular regurgitation, and;     d. the left atrium was within the upper limits in size.  ADMISSION HISTORY AND PHYSICAL:  Briefly, Ms. Mcclave is a 65 year old African-American female with a history of obstructive sleep apnea, hypertension, CHF with previous heart catheterization in January 2001, which revealed elevated left ventricular end diastolic pressure, left ventricular hypertrophy, pulmonary hypertension with a normal EF at that time.  Patient also has a history of obesity, diabetes as well as being O2 dependent. Patient was seen in the [facility] internal medicine clinic on January 26, 2001 with the chief complaint being shortness of breath as well as increased lower extremity edema bilaterally, which has been progressing over the last one month.  Patient also states that she has had worsening dyspnea on exertion, paroxysmal nocturnal dyspnea as well as orthopnea.  Patient states that she has been compliant with her home Lasix of 80 b.i.d., but states that she uses her C-PAP for her obstructive  sleep apnea approximately four or five times per week only.  Patient on the day of admission denies any recent fevers, chills, nausea, vomiting, diarrhea, abdominal pain or chest pains.  Patient does state that she has a nonproductive cough.  ADMISSION LABORATORIES:  Patient initially had electrolytes revealing a sodium of 143, potassium 3.7, chloride 105, bicarb 27, BUN 18, creatinine 1.1, and glucose 172.  Patients liver function tests revealed a bilirubin of 0.2, alk phos of 50, AST 23, ALT 16, total protein 7.4, albumin 2.8, and a calcium of 9.1.  Patients CBC revealed a white count of 9.5, hemoglobin 9.6 and platelets of 265,000 with an MCV of 80.9, and there was no left or right shift noted.  Patient had initial cardiac enzymes of a CK of 314, CK MB of 3.5 with a relative index of 1.1, and a troponin I level of 0.01.  The patients hemoglobin A1C on admission was 7.2.  Patients TSH was within normal limits at 3.145.  HOSPITAL COURSE:  #1 CHF exacerbation:  Patient was admitted to the hospital for increasing shortness of breath and lower extremity edema.  Patient has a history of CHF, which includes left heart diastolic dysfunction evident by elevated left ventricular end diastolic pressures as well as left ventricular hypertrophy, which was noted on a heart catheterization in January of 2001. Patient also had pulmonary hypertension and has a normal EF at that time. Patient had evidence of right heart failure likely secondary to her left heart failure with evidence of lower extremity edema bilaterally currently.  Patient did have a chest x-ray on admission, which showed cardiomegaly with pulmonary vascular congestion and interstitial edema.  Patient was admitted to a telemetry floor and subsequently ruled out for an acute coronary event.  Patient had no EKG changes compared to previous EKGs.  Patient was IV diuresed with Lasix.  Patient did have an echocardiogram performed with the  results noted above.  Of significant interest the patients EF has apparently decreased to a level of 35-45% compared to her previous normal EF two years ago.  The patient also had lower extremity Dopplers, which were negative.  The patient adequately diuresed over her short hospital course where she diuresed approximately 9-10 liters off.  Patients home Lasix dose was changed from 80 b.i.d. to 120 b.i.d.  Patient was also placed on potassium supplement of 20 mEq q.d.  Patients blood pressure medication was changed as the patient initially presented with systolic blood pressures of 99991111 over diastolics of A999333.  Patient was placed on her atenolol home dose and Imdur was added for afterload reduction given the fact that her EF had decreased.  Patient does have a history of ACE intolerance secondary to pancreatitis in the past. Patients blood pressure throughout hospital course trended down to levels of 140-170s over 60-80s at the time of discharge.  Further blood pressure management and her CHF management will be done as an outpatient.  Of not, the patient had a negative catheterization for coronary artery disease.  #2 Obstructive sleep apnea:  Patient admits to not using her C-PAP machine regularly.  It was stressed to the patient that her obstructive sleep apnea could be exacerbating her CHF and her pulmonary hypertension leading to further right-sided heart failure.  The patient stated that she would be more compliant with her C-PAP and use it on a daily basis.  She will continue with home O2 as previously on 2 liters at home.  #3 Chronic lymphedema:  Patient did have bilateral lower extremity edema with some tenderness, left greater than right-sided.  Patient had negative Dopplers on this examination for DVT.  Her lower extremity edema was likely attributed to both her CHF as well as lymphedema.  Patient was placed on compression stockings; she can take home with her.  Further  management with her increased Lasix should help out with her problem as well.  Patient was advised to keep her feet elevated at nighttime as well as when sitting.  Patient complained more of the left-sided leg pain and a uric acid level was pending at the time of discharge.  Patient may have some evidence of gout given that we increased her Lasix recently, which may exacerbate it.  #4 Diabetes mellitus Type II:  The patients CBGs remained relatively in good control during hospital course.  We will continue with her insulin 70/30 100 units b.i.d.  Patients glucophage was stopped secondary to her CHF; this was not restarted.  Chest CBGs as an outpatient alomg with her hemoglobin A1C and appropriate oral medication changes as an outpatient as needed.  #5 Osteoarthritis:  Patient has history of osteoarthritis in the knees, left greater than right.  Patient takes ibuprofen 800 mg p.r.n.  Patient had no evidence of underlying renal insufficiency with a baseline creatinine of approximately 1.1-1.2.  Of note, the patient is a very large lady.  This creatinine is likely normal for her.  DISCHARGE LABORATORIES:  Patient has electrolytes on the day of discharge revealing sodium 142, potassium 3.5, chloride 104, bicarb 32, glucose 103, BUN 21, and creatinine of 1.3.  Calcium of 8.9.  Patients most recent CBC revealed a white count of 8.0, hemoglobin 9.4 and platelets at 234,000. Patient has a pending uric acid level.  FOLLOW-UP:  Patient is to follow up in the internal medicine outpatient clinic with Dr. Earley Brooke.  At time check a basic metabolit panel as well as blood pressure check and CBG. Dictated by:   Earley Brooke, M.D. Attending Physician:  De Burrs DD:  01/29/01 TD:  01/31/01 Job: [id number]

## 2010-06-07 NOTE — Discharge Summary (Signed)
[name], [name] NO.:  [id number]   MEDICAL RECORD NO.:  [id number]                   PATIENT TYPE:  INP   LOCATION:  2037                                 FACILITY:  [facility]   PHYSICIAN:  Adella Hare, M.D.                   DATE OF BIRTH:  June 26, 1945   DATE OF ADMISSION:  09/25/2003  DATE OF DISCHARGE:  10/04/2003                                 DISCHARGE SUMMARY   CONSULTS OBTAINED:  Marland Kitchen T. Hoxworth, M.D. who is a Education officer, environmental.   DISCHARGE DIAGNOSES:  1.  Polymyalgia rheumatica.  2.  Presumed temporal arteritis (a temporal artery biopsy was negative).  3.  Community-acquired pneumonia.  4.  Acute renal failure secondary to intravenous contrast and indomethacin.  5.  Anemia secondary to chronic disease.  6.  Type 2 diabetes mellitus.   PAST MEDICAL HISTORY:  1.  Ischemic cardiomyopathy with an EF of 55-65% as per echo done in May      2005.  2.  Hypertension.  3.  Chronic renal insufficiency secondary to diabetes and hypertension.  4.  Gout.  5.  Obstructive sleep apnea and patient on CPAP.  6.  Allergic rhinitis.  7.  Normocytic anemia secondary to chronic renal insufficiency as well as      sickle cell trait.  8.  Chronic low back pain with surgery in 1988 at [facility].  9.  Status post left fibular stress fracture in December 2001.  10. History of trochanteric bursitis.  11. Pancreatitis secondary to ACE inhibitor in November 1997.  12. History of osteoarthritis in the knee.  13. Morbid obesity.  14. Coronary artery disease with three vessel disease in 1980s.  The patient      is followed by Dr. Terrence Dupont.  15. Mild aortic stenosis with aortic valve area of 1.43-cm2.  16. Total abdominal hysterectomy with bilateral salpingo-oophorectomy.  17. Hiatal hernia repair as well as an umbilical hernia repair.  18. History of sacroiliitis.  19. Colonoscopy done in November 2003.  It showed sigmoid tubular adenoma.   DISCHARGE MEDICATIONS:  1.  Coreg 6.25 mg p.o. b.i.d.  2.  Lasix 40 mg p.o. b.i.d.  3.  Insulin 70/30, 120 units in the a.m. and 120 units in the p.m.  4.  Cozaar 50 mg p.o. every day.  5.  Aspirin 81 mg p.o. every day.  6.  Calcium 1,500 mg p.o. every day.  7.  Multivitamin containing 400 mg of vitamin D daily.  8.  Prednisone 60 mg p.o. every day to be taken for three more weeks,      followed by a taper of 10 mg every week to a dose of 20 mg every day.      This will be followed by a taper of 2 mg every other week, until ESR      back to normal or the patient's symptoms resolve.  DISPOSITION AND FOLLOWUP:  1.  The patient will follow up with Dr. Eddie Dibbles for a hospital followup on      October 25, 2003 at 11:30 a.m.  An ESR will be obtained to follow up on      the polymyalgia rheumatica.  A B-MET will also be obtained since the      patient had renal failure during hospitalization.  Finally, a CBC will      be obtained since the patient had a hemoglobin drop during      hospitalization and received two units.  2.  Long term followup will be conducted by Dr. Derrel Nip who is the patient's      __________ clinic doctor and the appointment has been setup for November 08, 2003 at 4 p.m.  Dr. Derrel Nip will taper the patient's steroids.  The      patient will receive 60 mg of steroids for three more weeks followed by      10 mg per week taper to a dose of 20 mg every day.  This will be      followed by a 2 mg taper every other week until the symptoms resolve or      the ESR returns back to normal.  An ESR will be checked every time the      dose of steroids is being tapered.   PROCEDURES DONE:  1.  A CT scan of the chest performed, on September 28, 2003, to rule out      pulmonary embolism showed a slight decrease in lung volumes otherwise      unchanged, cardiovascular congestion, bilateral lower lobe atelectasis,      and probable interstitial edema.  2.  A CT of the abdomen pelvis performed, on September 27, 2003,  to rule out      retroperitoneal bleed showed the following:  No evidence of      retroperitoneal or intraperitoneal hematoma.  The uterus is markedly      atrophic or surgically absent.  Colon and small bowel are unremarkable.      No acute abnormalities in the pelvis.  3.  A temporal artery biopsy performed by Dr. Excell Seltzer, on      October 02, 2003, was negative for temporal arteritis.   HOSPITAL ADMISSION:  [name] is a 65 year old black female with an  extensive past medical history who presented with acute onset of substernal  chest pain, and 10/10 shortness of breath, as well as severe pain in the  left shoulder and the right knee.  The pain started after a nine and a half  hour drive from [place] and was proceeded by right leg pain from the knee  down.  The patient had come to the emergency room the day prior to admission  but was sent home after being ruled out for an MI.  Since the patient's  symptoms persisted, she returned to the emergency room.  The patient did not  find any relief of pain with morphine or nitroglycerin.   PHYSICAL EXAMINATION:  VITAL SIGNS:  On admission, pulse 92, blood pressure  142/68, temperature 99.4, respirations 22, sats 99% on 2 liters.  GENERAL:  The patient is morbidly obese.  RESPIRATORY:  Clear to auscultation bilaterally in the anterior lung fields.  CARDIOVASCULAR:  Regular rate and rhythm, distant S1 S2.  GI:  Soft.  Tender to palpation in the left hemi-abdomen.  Midline scar  noticed on  the abdomen.  No rebound, guarding, or rigidity.  Fecal occult  blood test negative.  EXTREMITIES:  Bilateral trace lower extremity edema.  Tender to palpation on  the right knee.  No calf tenderness.  MUSCULOSKELETAL:  Moves all extremities x 4.   ADMISSION LABS:  Sodium 141, potassium 4.8, chloride 111, bicarb 22, BUN 27,  creatinine 1.3, glucose 159.  Hemoglobin 9.2, white blood count 9.1 with ANC 6.9, platelets 247, MCV 82.6.   Point-of-care markers negative x 1.  BNP  75.8.  D-dimmer 0.093.   HOSPITAL COURSE:  1.  Polymyalgia rheumatica.  Since the patient had severe musculoskeletal      pain, an ESR was obtained along with ANA and a rheumatoid factor.      Initial ESR was 129, thus there was suspicion of polymyalgia rheumatica.      The patient was initially started on low-dose steroids of 20 mg per day.      On date of admission, the patient started developing headaches along      with tenderness in the region of the left temporal artery.  The steroid      dose was increased to 60 mg p.o. every day.  The patient did not have      any visual complaints.  A surgical consult was emergently obtained for a      temporal artery biopsy to rule out temporal arteritis.  The procedure      was performed four days after the patient was on 60 mg of prednisone and      was negative for temporal arteritis; however, since the procedure was      performed after four days of steroid treatment, the patient will      continue on the high doses of prednisone recommended for temporal      arteritis, since she does have an elevated ESR and had tenderness over      the left temple.  She will continue to receive 60 mg of prednisone per      day for a total of four weeks, followed by a 10 mg per week taper to a      dose of 20 mg every day.  Following that steroids will be tapered by 2      mg every other week until the patient has resolution of symptoms or ESR      returns to normal and ESR will be checked during each taper.  The      patient has also been advised that if she were to develop any visual      disturbances or any temporal tenderness, that she should increase her      dose of steroids to 60 mg every day.  ESR on day of discharge was 97.      The ESR will be monitored as an outpatient during hospital followup as      well as during continued clinic visits.  The patient's ANA was negative      and rheumatoid factor test  is pending.  2.  Community-acquired pneumonia.  A CT of the chest was obtained to rule      out pulmonary embolism given the patient's history.  A pulmonary      embolism was ruled out; however, CT did show bibasilar infiltrates and      thus the patient was treated for community-acquired pneumonia with      Avelox for a total of seven days.  The patient had a  normal chest exam      along with good oxygen saturations on the day of discharge.  3.  Acute renal failure secondary to contrast and indomethacin.  The      patient's baseline creatinine was 1.3.  After day one of admission, the      patient's creatinine went up to 2.5.  This was thought to be secondary      to a dye load the patient received on two consecutive days to rule out a      pulmonary embolism since the first CT chest was inconclusive.  The renal      insult from the dye load was further exacerbated by the patient being on      indomethacin for presumed gout pain.  Following the serum creatinine     rise of 2.5, the patient was put on aggressive fluids and indomethacin,      Cozaar, and Lasix were discontinued.  The creatinine continued to rise      for the next two days with a peak creatinine of 3.1.  However, on day      three of hospital admission, creatinine continued to drop and the      creatinine at discharge is 1.3.  During this entire admission, the      patient continued to maintain very good urine output and never became      oliguric.  On the day of discharge, the patient's Lasix as well as      Cozaar were resumed since the patient had returned to her baseline      creatinine of 1.3.  A B-MET will be obtained on hospital followup to      monitor the creatinine and to make sure that the renal failure has      resolved.  4.  Anemia most likely secondary to chronic disease as well as sickle cell      trait.  The patient's hemoglobin was 9.2 on admission.  B-12, ferritin,      and RBC folate levels were obtained  which were all normal (B-12 greater      than 2,000, ferritin 387, RBC folate 438).  Since the patient has a      history of renal disease, an erythropoietin level was obtained which was      elevated (36.3).  During the hospital course, the patient had a 2 gram      drop in hemoglobin.  She continued to be guaiac negative.  A CT scan of      the abdomen was done to rule out retroperitoneal bleed.  The patient      received two units of PRBCs.  Her hemoglobin was stable, after blood      transfusion and was 9.8 on the day of discharge.  Thus the patient may      have anemia secondary to chronic disease along with the sickle cell      trait.  5.  Type 2 diabetes mellitus.  The patient was taking 70/30 110 units in the      a.m. and 110 units in the p.m.  Her hemoglobin A1c level was checked and      it was 6.6.  The patient continued to have good blood sugar until the      steroids were started, and she started having sugars in the range of 200-      300.  Her insulin dosage was increased to 70/30, 120 units in the a.m.  and 120 units in the p.m. and her blood sugars were maintained between      80-120.  The patient will continue to give herself 120 units in the      morning and 120 units in the evening, until she is on high dose      steroids.  She has also been advised to start taking 1500 mg of calcium      and 400 mg of vitamin D since the patient is on steroids which can lead      to bone loss.  6.  Obstructive sleep apnea.  The patient was continued on CPAP at night.      She did not have any complications and maintained good oxygen      saturations during hospital stay.  The saturations were 97% on room air      on the day of discharge.   DISCHARGE LABS:  Sodium 143, potassium 4.3, chloride 115, bicarb 26, BUN 25,  creatinine 1.3, glucose 86.  White blood count 13, hemoglobin 9.8, platelets  298.  B-12 greater than 2,000.  Ferritin 387. RBC folate 438.  Erythropoietin 36.3.   ESR 97.  ANA negative.  Hemoglobin A1c 6.6.  DISCHARGE VITAL SIGNS:  Temperature 97.2, pulse 73, blood pressure 129/82,  and oxygen saturations were 97% on room air.  CBG was 71.   INSTRUCTIONS AT DISCHARGE:  1.  The patient has been instructed on a new medicine of prednisone 60 mg      p.o. every day.  She has been provided with the prescription.  2.  She has also been advised to start taking 1500 mg of calcium along with      400 mg of vitamin D every day to prevent bone loss.  3.  She has also been advised to increase her insulin 70/30 to 120 units in      the morning and 120 units in the evening and to monitor her blood sugars      and bring it with her on hospital followup.  4.  Finally, she has been advised to monitor for symptoms of temporal      arteritis such as headaches, temporal tenderness, and visual      disturbances and to contact us if these symptoms were to develop.                                                Adella Hare, M.D.    BP/MEDQ  D:  10/04/2003  T:  10/04/2003  Job:  [id number]   cc:   Deborra Medina, M.D.  Fax: [phone]

## 2010-06-07 NOTE — Op Note (Signed)
NAMESAMELLA, [name] NO.:  [id number]   MEDICAL RECORD NO.:  [id number]          PATIENT TYPE:  AMB   LOCATION:  ENDO                         FACILITY:  [facility]   PHYSICIAN:  Jeryl Columbia, M.D.    DATE OF BIRTH:  [birth year]   DATE OF PROCEDURE:  12/10/2004  DATE OF DISCHARGE:                                 OPERATIVE REPORT   PROCEDURE:  Colonoscopy.   INDICATIONS:  Anemia in a patient due for colonic screening, questionable  history of colon cancer. Consent was signed after risks, benefits, methods  and options were thoroughly discussed in the office.   MEDICINES USED:  Demerol 50, Versed 5.   PROCEDURE:  Rectal inspection was pertinent for external hemorrhoids, small.  Digital exam was negative. The video colonoscope was inserted and with  moderate difficulty due to a poor prep was able to be advanced to the cecum.  This did require rolling her on her back and abdominal pressure. Interesting  we came to an area that seemed to be the end but with rolling her on her  back, the area opened up and was just an area of spasm. No signs of bleeding  or abnormalities seen on insertion. The cecum was identified by the  appendiceal orifice and the ileocecal valve, scope was slowly withdrawn.  With over a liter of washing and suctioning, fair visualization was had  overall. There was still formed stool in the colon so lesions could be  missed underneath it, but no abnormalities were seen as we slowly withdrew  back to the rectum. The rectum did have some formed stool as well which  could not be suctioned. Anorectal pull-through and retroflexion confirmed  the hemorrhoids. The scope was straightened and readvanced a short ways up  the left side of the colon, air was suctioned, scope removed. The patient  tolerated the procedure well. There was no obvious immediate complication.   ENDOSCOPIC DIAGNOSES:  1.  Internal and external hemorrhoids.  2.  Fairly poor prep,  lesions could have been missed.  3.  No signs of bleeding.  4.  Otherwise within normal limits to the cecum.   PLAN:  Happy to see back p.r.n.  Repeat screening 5 years with a more  aggressive prep if doing well medically and return care to Dr. Karlton Lemon for  further workup and plans on her anemia like repeat guaiac's, CBCs and  consider further GI workup versus hematologic workup.           ______________________________  Jeryl Columbia, M.D.     MEM/MEDQ  D:  12/10/2004  T:  12/10/2004  Job:  [id number]   cc:   Royetta Crochet. Karlton Lemon, M.D.  Fax: [phone]

## 2010-06-07 NOTE — H&P (Signed)
[name], [name] NO.:  [id number]   MEDICAL RECORD NO.:  [id number]          PATIENT TYPE:  INP   LOCATION:  NA                           FACILITY[facility]   PHYSICIAN:  Cynda Familia, M.D.DATE OF BIRTH:  29-Sep-1945   DATE OF ADMISSION:  DATE OF DISCHARGE:                                HISTORY & PHYSICAL   DATE OF ADMISSION:  April 11, 2005   CHIEF COMPLAINT:  End-stage osteoarthritis of the left knee.   BRIEF HISTORY:  This is a 65 year old lady with a history of end-stage  osteoarthritis of the bilateral knees left greater than right. She has had  continued pain in both knees, more so on the left, that has failed to  respond to conservative management. She has been using a cane but still has  multiple episodes of the knee catching, locking, giving way, and causing her  to fall. She has failed conservative measures to manage her pain,  instability, falling, and interfering with her activities of daily living.  After discussion of treatment options she now opts for total knee  arthroplasty of her left knee. She has multiple medical problems including  hypertension, diabetes, history of congestive heart failure, and she is  morbidly obese and has sleep apnea. She understands the increased risk of  complications including DVT with PE, infection, cardiac problems that could  result in her death. She understands the increased risks due to her medical  problems, but due to her continued pain and inability to ambulate without  difficulty she wishes to proceed with surgery. She has had medical clearance  by her medical doctors, Dr. Willey Blade, and her cardiologist, Dr. Dixie Dials, and now surgery will go ahead as scheduled. The surgery risks,  benefits, and aftercare were discussed in detail.   PAST MEDICAL HISTORY:  Drug allergies:  None.   Current medications:  1.  Coreg 25 mg b.i.d.  2.  Gabapentin 600 mg t.i.d.  3.  Aspirin 81 mg daily,  which she stopped.  4.  Cozaar 50/12.5 one daily.  5.  Altace 5 mg daily.  6.  Colchicine 0.6 mg b.i.d.  7.  Allopurinol 300 mg daily.  8.  Sular 10 mg daily.  9.  Elavil 75 mg q.h.s.  10. Humulin insulin 70/30, 60 units q.a.m. and 50 units q.p.m.  11. Oxygen 2 L by nasal cannula.  12. She just started Norvasc today, dosage unknown.   Previous surgeries include:  1.  Hiatal hernia repair.  2.  Cardiac catheterization.  3.  Hysterectomy.   Medical illnesses include:  1.  Diabetes.  2.  Coronary artery disease.  3.  Hypertension.  4.  Congestive heart failure.  5.  Gout.   FAMILY HISTORY:  Positive for hypertension and cancer.   SOCIAL HISTORY:  The patient lives at home with her niece. She is disabled.  She does not smoke but did so in the past, and does not drink but also did  so in the past.   REVIEW OF SYSTEMS:  CENTRAL NERVOUS SYSTEM:  Negative for headache, blurred  vision, or dizziness. PULMONARY:  Positive for shortness of breath with  exertion, two-pillow orthopnea. She also has sleep apnea and uses a CPAP  machine. CARDIOVASCULAR:  Positive for history of congestive heart failure  and coronary artery disease. GI:  Positive for history of ulcers in 1980s  with treatment and no problem since then. GU:  Negative for urinary tract  difficulty. MUSCULOSKELETAL:  Positive in HPI.   PHYSICAL EXAMINATION:  VITAL SIGNS:  Blood pressure 170/102. She saw Dr.  Karlton Lemon for evaluation of this today, was placed on Norvasc and will be  rechecked on Thursday to make sure it comes down. Pulse 80 and regular,  respirations 16.  GENERAL APPEARANCE:  This is an obese black lady in no acute distress.  HEENT:  Head normocephalic. Nose patent, ears patent. Pupils equal, round,  and reactive to light. Throat without injection. Neck supple without  adenopathy. Carotids 2+ without bruit.  CHEST:  Clear to auscultation. No rales or rhonchi.  Respirations 16.  HEART:  Regular rate and rhythm  at 80 beats per minute without murmur.  ABDOMEN:  Soft with active bowel sounds. No masses or organomegaly.  NEUROLOGIC:  The patient alert and oriented to time, place, and person.  Cranial nerves II-XII grossly intact.  EXTREMITIES:  Shows bilateral lower extremity mild pitting edema at 1+. Her  left knee shows 0-110 degree range of motion. Dorsalis pedis and posterior  tibialis pulses are 1+.   X-rays show end-stage osteoarthritis of the left knee.   IMPRESSION:  End-stage osteoarthritis of the left knee.   PLAN:  Total knee arthroplasty left knee.      Judith Part. Chabon, P.A.    ______________________________  Cynda Familia, M.D.    SJC/MEDQ  D:  04/07/2005  T:  04/08/2005  Job:  [id number]

## 2010-06-07 NOTE — Discharge Summary (Signed)
[name], [name] NO.:  [id number]   MEDICAL RECORD NO.:  [id number]          PATIENT TYPE:  ORB   LOCATION:  [id number]                         FACILITY:  [facility]   PHYSICIAN:  Thomes Lolling, M.D.    DATE OF BIRTH:  1945-05-22   DATE OF ADMISSION:  12/18/2003  DATE OF DISCHARGE:  12/21/2003                                 DISCHARGE SUMMARY   DISCHARGE DIAGNOSIS:  1.  Right medial meniscus tear.  2.  Elevated sed rate with fever - fever has resolved.  3.  Hypertension.  4.  Anemia, likely of chronic disease.  5.  Diabetes mellitus requiring insulin.  6.  Obstructive sleep apnea requiring CPAP at night.  7.  Post Herpetic neuralgia.  8.  For all other chronic medical problems, please refer to prior discharge      summaries.   DISCHARGE MEDICATIONS:  1.  Insulin 70/30, 70 units with breakfast, 55 units with dinner.  2.  Cozaar 25 mg 1 p.o. daily.  3.  Protonix 40 mg 1 p.o. daily.  4.  Prednisone 5 mg p.o. q.o.d. for one additional week at the time of      discharge then discontinue.  5.  Elavil 75 mg 1 p.o. q.h.s.  6.  Aspirin 325 mg 1 p.o. daily.  7.  Coreg 25 mg 1 p.o. b.i.d.  8.  Colchicine 0.6 mg 1 p.o. b.i.d.  9.  Neurontin 600 mg 1 p.o. t.i.d.   DISPOSITION FOLLOW UP:  [name] was discharged from the subacute care unit  where she received physical therapy and was to be followed by physical  therapist at home.  She was able to bear weight on her right knee and was  without significant pain.  Her blood sugars were well controlled with a  change in her insulin regimen.  Her blood pressure was also well controlled  with a change in her hypertensive regimen.  She was encouraged to continue a  diabetic diet and encouraged to attempt to lose weight.  At the time of  follow up on December 19 at 2:20 p.m. at the [facility], she will need to be evaluated with a basic metabolic  panel to evaluate her potassium level  which varied throughout her hospital  stay and required no potassium supplementation as well as Kayexalate.  Also,  CBC should be evaluated for any evidence of leukocytosis.  Additionally,  leukocyte sedimentation rate was drawn on the day of discharge and the  results of this should be followed as well as an additional erythrocyte  sedimentation rate drawn to evaluate to see if it has decreased to within  normal limits and resolved.  Also, her right knee pain and how she is  getting along after physical therapy should be evaluated.   PROCEDURE PERFORMED:  [name] received a steroid injection to her right  knee by orthopedic surgery on December 11, 2003, in hopes of decreasing pain  in the right knee.  Additionally, multiple attempts at aspiration of the  knee were done on admission and  were unsuccessful.  Orthopedics also  believed that effusion of the right knee was significantly large enough to  aspirate the knee.  Imaging of the right knee showed osteoarthritis,  otherwise, normal.  An MRI of the right knee revealed a complex tear to the  medial meniscus and grade 4 osteoarthritis with an effusion of the right  knee.  An ultrasound evaluation of bilateral temporal arteries for possible  temporal arteritis and questionable history of polymyalgia rheumatica was  evaluated but showed no evidence of halo sign.   CONSULTATIONS:  [facility] was consulted during this hospital  admission, unfortunately unable to discern name of consultant in chart,  pager number [phone], and his physician number of [place].  [name] has also been evaluated by Dr. Hillery Aldo who is her orthopedic  surgeon who follows her normally as an outpatient.   BRIEF ADMISSION HISTORY AND PHYSICAL:  [name] is a 65 year old African  American woman with morbid obesity with a history of hypertension, chronic  renal insufficiency, type 2 diabetes, and questionable history of  polymyalgia  rheumatica, who was previously discharged on November 29, 2003,  for post herpetic neuralgia chest pain at which time she was started on  Neurontin and Elavil and states that her chest pain has improved since that  prior admission.  She presents on December 06, 2003, today with right knee  pain since one day prior to arrival.  She describes walking and hearing a  pop followed by pain 30 minutes later in the right knee.  After this, she  was unable to ambulate.  Upon arrival to the ED, she was found initially to  be febrile with a temperature of 102.1 and on recheck, 101.4.   Admission vital signs revealed pulse 98, blood pressure 191/98, on recheck  143/81, temperature 102.1, respirations 14, saturation 97% on 2 liters.  In  general, the patient is a morbidly obese African American woman in acute  distress.  Eyes:  Pupils equal, round, reactive to light, extraocular  movements intact.  ENT:  Oropharynx clear with evidence of upper dentures.  Neck supple with no lymphadenopathy.  Respirations revealed bilateral  crackles halfway up lungs bilaterally but with good air movement.  Cardiovascular exam reveals regular rate and rhythm with questionable  evidence of 1/6 systolic ejection murmur heard best at the left upper  sternal border.  GI exam was soft, nontender, nondistended, positive bowel  sounds, obese abdomen.  Abdomen also revealed a long, well healed midline  scar.  The abdomen was not tender to palpation and had good bowel sounds.  Examination of the extremities revealed 2+ pitting bilaterally with right  knee which was tender to palpation with a mild effusion and warmth in the  right knee.  She was noted to have poor pedal care with bilateral scaling  and uncomycosis but with no apparent rashes, ulcers, or lesions on her feet.  She also did not have any evidence of hemorrhages, nodes, rough spots.   ADMISSION LABORATORY DATA:  Sodium 138, potassium 2.8, chloride 101, bicarb 30, BUN  21, creatinine 1.6, blood glucose 116.  Hemoglobin 8.5, white blood  cell count 12.2, platelets 239, ANC 7.2, MCV 84.6.  AST 20, ALT 23, alkaline  phos 30, T-bili 0.8.  Total protein 6.8, albumin 2.5.  Calcium 8.8.  UA  negative except for a total protein of 100.  Urine microscopy showed a few  epithelial cells and hyaline cast.  Initial chest x-ray showed cardiac  enlargement with vascular congestion.  Right knee plain film showed  osteoarthritis with joint effusion.   HOSPITAL COURSE:  Problem 1:  Right medial meniscus tear.  [name] was initially evaluated  by the internal medicine team who attempted to perform an osteocentesis to  evaluate for evidence of a gouty flare versus traumatic effusion versus a  septic joint.  However, the effusion was not large enough to be aspirated by  the internal medicine team or by radiology.  Because of the popping sound in  the right knee followed by pain, an MRI was ordered to evaluate for any type  of meniscal tear which was discovered.  The right medial meniscus tear was  thought to cause all [name]'s right sided pain and effusion.  She was  evaluated by orthopedic surgery as noted above who believed [name] would  best be served by treating her pain and getting her evaluated by physical  therapy which she was and when all medical issues were appropriately  controlled, she was transferred to the subacute care unit where she received  additional rehabilitation and will continue to receive Home Health for  physical therapy of the right knee.  It was not felt that this infection  represented any type of septic arthralgia due to her fever resolving very  quickly and not returning after getting broad spectrum antibiotics.   Problem 2:  Elevated sed rate with fever.  Because [name] presented with  a sed rate elevated to 144 with some vague nonspecific symptoms unresponsive  to steroids, the possibility of subacute bacterial endocarditis was   considered.  However, [name] did not have any signs or symptoms of  endocarditis and no evidence of murmur on physical exam.  Antibiotics were  continued ands then discontinued after one day.  The patient's temperature  curve was followed for any spiking and fever or any positive blood cultures  with the intent of evaluating the patient with transesophageal  echocardiogram if either of these events occurred. However, the patient did  not experience any additional temperature spikes after discontinuing broad  spectrum antibiotics, hence, it was unclear what was causing either the  fever and the elevation in sed rate.  The sed rate was followed throughout  hospital admission which did trend down but remained elevated.  The etiology  of her elevated sed rate was still unclear at the time of discharge and the  sed rate was drawn at the time of discharge and should be re-evaluated at  the time of follow up.  The patient carries a tentative diagnosis of polymyalgia rheumatica, hence, the intracranial Dopplers were performed to  evaluate for temporal arteritis or evidence of halo sign which was negative.  The patient was able to tolerate decrease in steroid dosages without any ill  affect.  An anti-DNA, ANA, and anti-RNP and smooth muscle, rheumatoid factor  were drawn for evaluation for any type of connective tissue disease and were  all found to be negative during this hospital admission.   Problem 3:  Hypertension.  [name] was transitioned to Cozaar 25 mg daily  and Coreg 25 mg b.i.d. which seemed to provide the best control of her blood  pressure and she was discharged on this regimen.   Problem 4:  Anemia.  [name] has been worked up extensively for her  history of anemia during prior admission with no exact etiology discovered.  It was felt that [name]'s anemia was likely due to anemia  of chronic  disease.  However, [name]'s hemoglobin continued to fall during hospital   admission to a nadir of 6.9 on November 20.  Additional workup of her anemia  was again pursued including the hemolysis workup as well as ferritin B12,  iron, folate, TIBC, all were within normal limits.  The decision was made to  go ahead and transfuse [name] 2 units packed red blood cells.  Her  hemoglobin bumped appropriately to 8.6 the following day and remained within  normal limits throughout her hospital stay.   Problem 5:  Diabetes mellitus.  [name] blood sugars did not seem to be  appropriately controlled on the regimen she came in with.  Titration to this  regimen was made and, at the time of discharge, she was on 70 units with 55  units at night of her 70/30 which provided very good control of her blood  sugars.  Her hemoglobin A1C was evaluated during this hospital admission and  was found to be 6.1.   Problem 6:  Obstructive sleep apnea.  Ms. Jankowiak was continued on her CPAP at  night during this hospital admission and did not experience any episodes of  hypoxia or any distress while sleeping and was told to continue this regimen  at the time of discharge.   Problem 7:  Post herpetic neuralgia.  Ms. Trembley was continued on her Elavil  and Neurontin at the doses she came in on with continued improvement of her  chest pain from post herpetic neuralgia and was told to continue this  regimen at the time of discharge.   DISCHARGE LABORATORY DATA:  Hemoglobin 8.6, hematocrit 25.7, white blood  cell count 8.4, platelets 347.  Sodium 146, potassium 3.8, chloride 114,  bicarb 26, BUN 21, creatinine 1, blood glucose 87.       AK/MEDQ  D:  01/05/2004  T:  01/05/2004  Job:  [id number]   cc:   Deborra Medina, M.D.  Fax: [phone]

## 2010-06-07 NOTE — Consult Note (Signed)
[name], [name] NO.:  [id number]   MEDICAL RECORD NO.:  [id number]                   PATIENT TYPE:  INP   LOCATION:  4731                                 FACILITY:  [facility]   PHYSICIAN:  Fabio Asa, M.D.                 DATE OF BIRTH:  04/27/1945   DATE OF CONSULTATION:  06/05/2003  DATE OF DISCHARGE:                                   CONSULTATION   REFERRING PHYSICIAN:  Larey Dresser, M.D.   INDICATION FOR CONSULTATION:  syncope.   HISTORY:  [name] is a 65 year old female who has been followed by  multiple cardiologists in the past.  She has seen Dr. __________. She has  seen Dr. Oswald Hillock she has been evaluated by a cardiologist at the [facility] whom she is unable to name.  She presented to the emergency room on  May 14 following a questionable syncopal episode that occurred while the  patient was washing dishes.  She states that she felt the room spin around  and sat down quickly.  The duration of the syncopal episode is unclear.  The  patient states that her son walked in, found her unconscious but with a good  pulse.  EMS was called.  She was transferred to the emergency room where she  was unresponsive upon initial evaluation.  She was given smelling salts  which resulted in her head moving side to side.  The ER physician notes that  there was a blank stare with the eyes open.  After approximately 45 minutes,  the patient was more alert and able to answer questions.  There was a  gradual return to consciousness.  There was no arrhythmia noted upon arrival  to the emergency room.  Systolic blood pressure on arrival was 147/77, heart  rate 67, O2 saturation 100%.   PAST MEDICAL HISTORY:  1. Nonischemic cardiomyopathy, ejection fraction 35 to 40%.  2. Obstructive sleep apnea.  3. Hypertension.  4. Lower extremity lymphedema.  5. Pulmonary hypertension.  6. Home O2 dependence with CPAP at night.  7. Normocytic  anemia.  8. Chronic low back pain.  9. Status post total abdominal hysterectomy and bilateral salpingo-     oophorectomy.  10.      Diabetes, type 2, with peripheral neuropathy.  11.      Status post left fibular stress fracture December 2001.  12.      Status post hiatal hernia repair.  13.      History of acute pancreatitis.  14.      History of osteoarthritis to her knees.  15.      She has had multiple left heart catheterizations performed with the     last catheterization performed in 2001 by Dr. Leonia Reeves.  16.      Chronic renal insufficiency.  Creatinine is 1.8.   MEDICATIONS AT HOME:  1. Lasix 40 mg b.i.d.  2. Aspirin 325 mg daily.  3. Cozaar 50 mg daily.  4. There is questionable history of pancreatitis with ACE use in the past.  5. Allopurinol 100 mg daily.  6. Allegra 180 mg daily.  7. Percocet p.r.n.  8. Coreg 6.25 mg b.i.d.  9. Colchicine p.r.n.  10.      Zaroxolyn p.r.n.   INPATIENT MEDICATIONS:  1. Humulin 70/30.  2. Aspirin 325 mg daily.  3. Allopurinol 100 mg daily.  4. Ferritin 10 mg daily.  5. Coreg 6.25 mg b.i.d.  6. Protonix 40 mg daily.  7. Lovenox 40 mg subcutaneously.  8. Cozaar 50 mg daily.  9. Lasix 40 mg b.i.d.  10.      Percocet p.r.n.   ALLERGIES:  As noted above, questionably ACE INHIBITORS resulting in  pancreatitis.   REVIEW OF SYSTEMS:  She notes that she has had occasional palpitations.  She  has noted sharp pains in her head with the room spinning, duration 1 to 2  hours, usually will occur when patient is sitting.  She has had no chest  pain, denies orthopnea.  No falls, no cough.   FAMILY HISTORY:  Mother has hypertension.  Father has hypertension.   SOCIAL HISTORY:  Remote history of tobacco use, no history of alcohol.  She  is single, is followed in the [facility].   PHYSICAL EXAMINATION:  GENERAL:  Middle-age, obese female in no acute  distress.  VITAL SIGNS:  Blood pressure 118/70, heart rate 72, respiratory rate 20.  She  is afebrile.  NEUROLOGIC:  Alert and oriented, no focal deficits.  HEENT:  Unremarkable.  CHEST:  Breath sounds equal and clear to auscultation.  O2 saturation 99% on  room air.  CARDIAC:  Normal S1, normal S2.  There is a 2/6 systolic ejection murmur  noted at the left lower sternal border.  Exam is made difficult by body  habitus. There are no carotid bruits noted.  ABDOMEN:  Morbidly obese.  No unusual bruits or pulsations are noted.  EXTREMITIES:  Reveal venous stasis changes.  There is currently no  peripheral edema noted.   LABORATORY AND X-RAY DATA:  Hematocrit 28.1, platelet count 184.  Potassium  3.7, creatinine 1.7.  CK peak was 842 with normal relative index.  Troponin  I less than 0.01.  BNP 308.  TSH 1.5.  Hemoglobin A1C 7.0.   Echocardiogram in 2003 revealed ejection fraction 35 to 45% with increase in  left atrial dimension.   MRI revealed no acute ischemic changes.   EKG reveals sinus rhythm, normal axis, slightly prolonged QTC at 476 msec.   IMPRESSION:  46. A 65 year old female with questionable syncopal episode.  The resolution     of the syncope is not consistent with cardiac in that cardiac syncope is     abruptly terminated.  The patient was not noted to have any arrhythmias     while in the hospital and in her unresponsive state.  We will proceed     with a 2-D echocardiogram.  If her ejection fraction is less than 40%,     will consider an EP consult for further evaluation.  Unsure as to who is     following the patient on an outpatient regimen.  She will need titration     of her Coreg on an outpatient basis.  2. Hypertension.  Blood pressure is controlled with beta blockers and ARBs.  3. Obstructive sleep apnea.  Continue with CPAP mask at night.  4. Chronic renal insufficiency.  Creatinine is stable and at baseline.                                               Fabio Asa, M.D.   HP/MEDQ  D:  06/05/2003  T:  06/05/2003  Job:  [id number]   cc:    Larey Dresser, M.D.  [facility] - [facility].  [place], [place] [address]  Fax: [phone]

## 2010-06-07 NOTE — Discharge Summary (Signed)
[place]  Patient:    [name], [name]                        MRN: [id number] Adm. Date:  [id number] Disc. Date: [id number] Attending:  Starla Link Dictator:   Marjory Lies CC:         Johnn Hai, M.D.   Discharge Summary  DISCHARGE MEDICATIONS:  1. Vicodin 5/500 one to two p.o. q.6h. p.r.n. pain.  2. Lasix 80 mg one p.o. b.i.d.  3. Atenolol 50 mg one p.o. q.d.  4. Insulin 70/30 100 units b.i.d. as directed.  5. Vioxx 50 mg one p.o. q.d.  6. Elavil 50 mg one p.o. q.h.s.  7. Aspirin 325 mg one p.o. q.d.  8. Protonix 40 one p.o. q.d.  9. Cefuroxime 500 one p.o. q.12h. x 6 days. 10. Multivitamin with iron one p.o. q.d. 11. Glucophage 1000 mg q.d.  PROBLEM LIST: 1. Left fibular fracture. 2. Diabetes mellitus. 3. Hypertension. 4. Sleep apnea. 5. Morbid obesity. 6. Tinea pedis. 7. Chronic back pain. 8. Sinusitis.  CHIEF COMPLAINT:  Left lower extremity pain.  HISTORY OF PRESENT ILLNESS:  Ms. Hartlieb is a 65 year old black female with diabetes type 2, obesity, sickle cell trait, and obstructive sleep apnea.  She complains of left leg swelling and pain from toes to knee x 2 days, progressive worsening of pain plus warmth, plus redness.  No fever, no chills. No similar prior episodes.  No injury.  No insect sting.  No cuts or ulcers.  PAST MEDICAL HISTORY:  Coronary artery disease, diabetes type 2, hemoglobin A1C 6.4 on May 7, OSA with pulmonary hypertension (CPAP titration study to 11 cm), history of acute pancreatitis November 1997 secondary to ACE inhibitor, sickle cell trait anemia, arthritis, history of trochanteric bursitis, status post total abdominal hysterectomy/BSO, status post hiatal hernia repair, status post umbilical hernia repair, hypertension, tinea pedis.  ALLERGIES:  None known.  MEDICATIONS: 1. Lasix 80 mg p.o. b.i.d. 2. Atenolol 50 mg p.o. q.d. 3. Insulin 70/30 100 units b.i.d. s.c. 4. ASA 325 p.o. q.d. 5.  Glucophage 1000 mg p.o. b.i.d. 6. Ibuprofen 800 mg p.o. p.r.n. 7. Elavil 15 mg p.o. q.h.s.  FAMILY HISTORY:  Parents:  Hypertension, deceased.  Sister:  "Cancer behind eye."  SOCIAL HISTORY:  Lives in [place].  Divorced.  Lives alone.  Disabled. Worked at [employer] in the past.  Five children, four alive.  Tobacco: One-third pack per day x 15 years.  Quit 10 years ago.  No alcohol currently. No illicits.  REVIEW OF SYSTEMS:  No chest pain, dyspnea on exertion.  No dyspnea at rest. No cough, dysuria, vomiting, diarrhea.  Otherwise negative.  PHYSICAL EXAMINATION:  VITAL SIGNS:  Temperature 98.6, blood pressure 171/94, pulse 87, respirations 22, O2 saturation 99% on room air.  GENERAL:  No acute distress.  Obese.  Alert and oriented x 4.  HEENT:  PERRL.  Face:  Symmetric.  Pharynx:  Mildly erythematous and clear rhinorrhea.  NECK:  Supple, nontender.  Full range of motion.  CARDIOVASCULAR:  Regular rate, rhythm.  Grade 1-2/6 right upper sternal border systolic murmur.  LUNGS:  Respirations clear, symmetric.  ABDOMEN:  Soft, nontender.  No masses.  Nondistended.  EXTREMITIES:  Left lower extremity swollen, warm, hyperpigmented from foot to mid calf.  SKIN:  Warm.  Hyperpigmented as noted above.  NEUROLOGIC:  Cranial nerves grossly intact.  Motor:  Limited by pain.  LYMPH:  No lymphangitic streaking  or lymphadenopathy at groin.  LABORATORIES:  CT of chest and limited view of extremities:  No PE, mild atelectasis, no pneumothorax, no infiltrate, mild cardiomegaly, no effusion, no DVT evident in this view, no free air or fasciitis evident, mild left thyroid enlargement, few increased mediastinal lymph nodes (largest 1.6 cm in pretracheal region; 1 cm left hilar lymph node).  HOSPITAL COURSE:  Patient had exquisite pain on palpation of left lower extremity, especially ankle and foot.  Differential included gout, pseudogout, septic arthritis, Charcots joint,  sarcoidosis, fracture, DVT, and cellulitis. Patient was begun on Cefuroxime 750 mg q.8h. for cellulitic component and also Lotrimin 1% b.i.d. to be given for three weeks for the tinea pedis which might have served as a vehicle to seed the cellulitis.  A trial of Toradol was ineffective.  A tap of the joint on December 9 failed.  The D-dimer was normal at 0.42.  Chest x-ray showed vascular congestion and increased heart size as well as mild lymphadenopathy.  ACE level was normal at 25.  Liver enzymes were within normal limits.  Blood cultures were negative x 2.  Bone scan showed probable degenerative areas within both mid feet, left greater than right, and possible stress fracture in distal left fibula.  Orthopedics saw patient and advised use of CAM walker and weightbearing as tolerated.  DISCHARGE LABORATORIES:  Sodium 141, potassium 4.4, chloride 105, bicarbonate 29, BUN 24, creatinine 1.3, glucose 117, calcium 8.3.  White blood cell 5.6, hemoglobin 8.8, hematocrit 27, platelets 233, protein 6.7, albumin 2.5, total bilirubin 0.5.  On discharge the patient had decreased swelling and pain in the left lower extremity.  Pain was still significant, but controlled.  The etiology is considered to be a stress fracture which will be treated by use of the CAM walker.  Patient is to follow up with Dr. Tonita Cong.  INSTRUCTIONS:  Medications as described in discharge medications.  Use CAM walker during day; take off at nighttime.  May also use crutches.  Put weight on left leg as tolerated.  Follow up with Dr. Tonita Cong in three to four weeks. DD:  01/03/00 TD:  01/04/00 Job: [id number]

## 2010-06-07 NOTE — Discharge Summary (Signed)
NAMEAUDINE, LIPTON NO.:  [id number]   MEDICAL RECORD NO.:  [id number]          PATIENT TYPE:  INP   LOCATION:  [id number]                         FACILITY:  [facility]   PHYSICIAN:  Cynda Familia, M.D.DATE OF BIRTH:  29-Nov-1945   DATE OF ADMISSION:  08/07/2005  DATE OF DISCHARGE:  08/13/2005                                 DISCHARGE SUMMARY   ADMISSION DIAGNOSES:  End-stage osteoarthritis, left knee.   DISCHARGE DIAGNOSES:  End-stage osteoarthritis, left knee.   OPERATION:  Total knee arthroplasty, left knee.   BRIEF HISTORY:  This is a 65 year old female with a history of end stage  osteoarthritis of both knees who has had failure of conservative management  to alleviate her symptoms and pain.  She has multiple medical problems that  increase her risk at surgery.  However, she has had medical clearance and  wishes to proceed, even in spite of the increased risk secondary to her  medical problems.  The surgery, benefits and after care were discussed in  detail with the patient.  Questions advised and answered, and surgery to go  ahead as scheduled.   LABORATORY DATA:  Admission CBC shows hemoglobin low at 9.4, hematocrit low  at 28.8, RDW high at 17.9, hemoglobin and hematocrit reached a low of 8.4  and 25.7.  She was then transfused, and at discharge was back to 9.4 and  28.8.  She ran a mildly high white count, but that was corrected at  discharge.  Reticulocyte count mildly low at 3.64.  PT/PTT within normal  limits on admission, and discharge INR was 2.9.  Admission C-met showed  potassium low at 3.1, BUN high at 40, creatinine high at 1.7, calcium low at  7.9.  She remained with elevated glucose throughout the initial portion of  her hospitalization, up as high as 274, and then was kept in the normal  range thereafter.  BUN and creatinine remained mildly elevated throughout  admission compatible with her renal disease secondary to her diabetes.  Hemoglobin A1C is 7.4.  Urinalysis showed high protein, few epithelial's and  small leukocyte esterase with some hyaline casts.  Blood cultures showed no  growth and urine culture showed insignificant growth.   HOSPITAL COURSE:  The patient tolerated the operative procedure well.  [facility] hospitalists were called to help manage her diabetes, hypertension  postoperatively.  The first postoperative day she stated she was feeling  pretty good.  Her vital signs were stable.  Temperature to 101.4.  O2  saturations 95.  Good I&O's.  Hemoglobin and hematocrit were stable.  Lungs  clear.  Heart sounds normal.  Bowel sounds active.  Calves were negative.  Drain was removed without difficulty.  Incentive spirometry was increased  secondary to a mild temperature.  Postop day #2, vital signs were stable.  Temperature down to 100.5, hemoglobin 8.4.  Her blood pressure was elevated  and __________Services started her on Cozaar which helped control it nicely.  Neurovascular status intact.  Dressing was changed.  Wound was benign.  The  patient was transfused.  Foley and PCA were discontinued.  Also, due to her  intermittent temperature, blood cultures and urine cultures were obtained,  and these came back as negative.  Third postoperative day, vital signs were  stable.  She was comfortable.  Her temperature was 100.7, hemoglobin and  hematocrit were back up 9.8 and 29.9.  INR 2.1.  Still in mild renal failure  at BUN 35, creatinine 2.2.  Dressing was changed.  Wound was benign.  Calves  were negative.  Lungs were clear.  She continued with her postop physical  therapy.  Postoperative day #4, she stated she was feeling good.  Vital  signs were stable, afebrile.  O2 saturations 96.  I&O was good.  Hemoglobin  9, hematocrit 27.5, WBC 11.3.  Lungs were clear.  Heart sounds normal.  Bowel sounds sluggish.  Dressing was changed.  Wound benign, and discharge  plan was made for home Wednesday if she was  medically stable.  Postop day  #5, the patient was feeling good, vital signs were stable.  Temperature to a  maximum of 99.4.  O2 saturations 97 on 2 liters.  Hemoglobin 9.4 and  hematocrit 28.8.  INR 2.6.  Lungs clear.  Bowel sounds active.  Heart sounds  normal.  Wound was benign. Calves were negative.  Plans were made for  discharge in the morning if she continues to be medically stable.  On postop  day #6, with vital signs stable, afebrile, CBG is good, O2 91, INR 2.9.  Lungs clear.  Heart sounds normal.  Bowel sounds active, wound benign.  The  patient was discharged home for follow up in the office.   CONDITION ON DISCHARGE:  Improved.   DISCHARGE MEDICATIONS:  1. Percocet one q.4-6 h p.r.n. pain  2. Robaxin one q.8 h p.r.n. spasm.  3. Coumadin per pharmacy protocol.  4. Trinsicon one pill b.i.d. for anemia.  5. Her Hyzaar and Demadex were discontinued until she sees Dr. Karlton Lemon on      an outpatient basis.  6. Her Lasix was discontinued until she saw Dr. Karlton Lemon on an outpatient      basis.  7. Her Actos was discontinued.  8. Lantus insulin was changed to 20 units subcu two times a day with meals      and blood sugar checks two times a day.   FOLLOWUP:  Follow up with Dr. Theda Sers in 10 days.  Follow up with Dr.  Karlton Lemon in 7 days.   DISCHARGE INSTRUCTIONS:  She is to do her home [facility] physical therapy.  Call  if any problems or questions arise.      Judith Part. Chabon, P.A.    ______________________________  Cynda Familia, M.D.    SJC/MEDQ  D:  09/04/2005  T:  09/04/2005  Job:  [id number]

## 2010-06-07 NOTE — Op Note (Signed)
[name], [name] NO.:  [id number]   MEDICAL RECORD NO.:  [id number]                   PATIENT TYPE:  INP   LOCATION:  2037                                 FACILITY:  [facility]   PHYSICIAN:  Larrie Kass., M.D.            DATE OF BIRTH:  [birth year]   DATE OF PROCEDURE:  10/02/2003  DATE OF DISCHARGE:                                 OPERATIVE REPORT   PREOPERATIVE DIAGNOSIS:  Headaches of uncertain etiology.   POSTOPERATIVE DIAGNOSIS:  Headaches of uncertain etiology.   OPERATION PERFORMED:  Left temporal artery biopsy.   SURGEON:  Georgina Quint, M.D.   ANESTHESIA:  Local with sedation.   DESCRIPTION OF PROCEDURE:  After the patient was monitored and lightly  sedated and had routine preparation and draping of the left side of the  face, I made a  short incision just in front of the upper part of the ear  just where I had located the temporal artery by Doppler.  I dissected  straight down getting hemostasis with the cautery.  I did it under local  anesthetic with good effect.  I located a couple of veins crossing over and  ligated though and divided it and then I saw the temporal artery coursing  along adjacent to a larger vein.  I removed about a 1.5 cm segment of the  vessel and ligated the two ends with 4-0 Vicryl.  I then got good hemostasis  and closed the skin with three interrupted sutures of 4-0 Vicryl and Steri-  Strips and applied a sterile bandage.  The patient tolerated the operation  very well.                                               Larrie Kass., M.D.    WB/MEDQ  D:  10/02/2003  T:  10/02/2003  Job:  [id number]

## 2010-06-10 ENCOUNTER — Other Ambulatory Visit (INDEPENDENT_AMBULATORY_CARE_PROVIDER_SITE_OTHER): Payer: Self-pay | Admitting: General Surgery

## 2010-06-10 DIAGNOSIS — IMO0002 Reserved for concepts with insufficient information to code with codable children: Secondary | ICD-10-CM

## 2010-06-11 ENCOUNTER — Other Ambulatory Visit (INDEPENDENT_AMBULATORY_CARE_PROVIDER_SITE_OTHER): Payer: Self-pay | Admitting: General Surgery

## 2010-06-11 DIAGNOSIS — IMO0002 Reserved for concepts with insufficient information to code with codable children: Secondary | ICD-10-CM

## 2010-06-13 ENCOUNTER — Other Ambulatory Visit (HOSPITAL_COMMUNITY): Payer: Medicare (Managed Care)

## 2010-06-13 ENCOUNTER — Ambulatory Visit (HOSPITAL_COMMUNITY)
Admission: RE | Admit: 2010-06-13 | Discharge: 2010-06-13 | Disposition: A | Discharge status: Home / Self Care | Payer: Medicare (Managed Care) | Source: Ambulatory Visit | Attending: General Surgery | Admitting: General Surgery

## 2010-06-13 DIAGNOSIS — Y838 Other surgical procedures as the cause of abnormal reaction of the patient, or of later complication, without mention of misadventure at the time of the procedure: Secondary | ICD-10-CM | POA: Insufficient documentation

## 2010-06-13 DIAGNOSIS — IMO0002 Reserved for concepts with insufficient information to code with codable children: Secondary | ICD-10-CM | POA: Insufficient documentation

## 2010-06-13 LAB — CBC
MCV: 90.6 fL (ref 78.0–100.0)
Platelets: 248 10*3/uL (ref 150–400)
RBC: 3.92 MIL/uL (ref 3.87–5.11)
WBC: 11.1 10*3/uL — ABNORMAL HIGH (ref 4.0–10.5)

## 2010-06-13 LAB — GLUCOSE, CAPILLARY
Glucose-Capillary: 180 mg/dL — ABNORMAL HIGH (ref 70–99)
Glucose-Capillary: 145 mg/dL — ABNORMAL HIGH (ref 70–99)

## 2010-06-13 LAB — PROTIME-INR
INR: 0.93 (ref 0.00–1.49)
Prothrombin Time: 12.7 seconds (ref 11.6–15.2)

## 2010-06-18 ENCOUNTER — Encounter (HOSPITAL_COMMUNITY): Payer: No Typology Code available for payment source

## 2010-06-18 ENCOUNTER — Other Ambulatory Visit: Payer: Self-pay | Admitting: Nephrology

## 2010-06-18 LAB — POCT HEMOGLOBIN-HEMACUE: Hemoglobin: 10.6 g/dL — ABNORMAL LOW (ref 12.0–15.0)

## 2010-06-26 ENCOUNTER — Other Ambulatory Visit (INDEPENDENT_AMBULATORY_CARE_PROVIDER_SITE_OTHER): Payer: Self-pay | Admitting: General Surgery

## 2010-06-26 LAB — CBC WITH DIFFERENTIAL/PLATELET
MCHC: 33.4 g/dL (ref 30.0–36.0)
Neutro Abs: 7.7 10*3/uL (ref 1.7–7.7)
Neutrophils Relative %: 76 % (ref 43–77)
Platelets: 270 10*3/uL (ref 150–400)
RDW: 15.9 % — ABNORMAL HIGH (ref 11.5–15.5)

## 2010-06-26 LAB — COMPREHENSIVE METABOLIC PANEL
Albumin: 2.7 g/dL — ABNORMAL LOW (ref 3.5–5.2)
BUN: 26 mg/dL — ABNORMAL HIGH (ref 6–23)
Calcium: 8.6 mg/dL (ref 8.4–10.5)
Chloride: 112 mEq/L (ref 96–112)
Glucose, Bld: 148 mg/dL — ABNORMAL HIGH (ref 70–99)
Potassium: 4.4 mEq/L (ref 3.5–5.3)
Sodium: 150 mEq/L — ABNORMAL HIGH (ref 135–145)
Total Protein: 5.8 g/dL — ABNORMAL LOW (ref 6.0–8.3)

## 2010-06-27 ENCOUNTER — Ambulatory Visit
Admission: RE | Admit: 2010-06-27 | Discharge: 2010-06-27 | Disposition: A | Discharge status: Home / Self Care | Payer: No Typology Code available for payment source | Source: Ambulatory Visit | Attending: General Surgery | Admitting: General Surgery

## 2010-06-27 ENCOUNTER — Other Ambulatory Visit (INDEPENDENT_AMBULATORY_CARE_PROVIDER_SITE_OTHER): Payer: Self-pay | Admitting: General Surgery

## 2010-07-01 ENCOUNTER — Other Ambulatory Visit (INDEPENDENT_AMBULATORY_CARE_PROVIDER_SITE_OTHER): Payer: Self-pay | Admitting: General Surgery

## 2010-07-01 ENCOUNTER — Emergency Department (HOSPITAL_COMMUNITY)
Admission: EM | Admit: 2010-07-01 | Discharge: 2010-07-02 | Disposition: A | Discharge status: Home / Self Care | Payer: No Typology Code available for payment source | Attending: Emergency Medicine | Admitting: Emergency Medicine

## 2010-07-01 ENCOUNTER — Emergency Department (HOSPITAL_COMMUNITY): Payer: No Typology Code available for payment source

## 2010-07-01 DIAGNOSIS — M129 Arthropathy, unspecified: Secondary | ICD-10-CM | POA: Insufficient documentation

## 2010-07-01 DIAGNOSIS — R109 Unspecified abdominal pain: Secondary | ICD-10-CM | POA: Insufficient documentation

## 2010-07-01 DIAGNOSIS — L0291 Cutaneous abscess, unspecified: Secondary | ICD-10-CM

## 2010-07-01 DIAGNOSIS — I509 Heart failure, unspecified: Secondary | ICD-10-CM | POA: Insufficient documentation

## 2010-07-01 DIAGNOSIS — I10 Essential (primary) hypertension: Secondary | ICD-10-CM | POA: Insufficient documentation

## 2010-07-01 DIAGNOSIS — Z7982 Long term (current) use of aspirin: Secondary | ICD-10-CM | POA: Insufficient documentation

## 2010-07-01 DIAGNOSIS — T8140XA Infection following a procedure, unspecified, initial encounter: Secondary | ICD-10-CM | POA: Insufficient documentation

## 2010-07-01 DIAGNOSIS — Z79899 Other long term (current) drug therapy: Secondary | ICD-10-CM | POA: Insufficient documentation

## 2010-07-01 DIAGNOSIS — M109 Gout, unspecified: Secondary | ICD-10-CM | POA: Insufficient documentation

## 2010-07-01 DIAGNOSIS — Y838 Other surgical procedures as the cause of abnormal reaction of the patient, or of later complication, without mention of misadventure at the time of the procedure: Secondary | ICD-10-CM | POA: Insufficient documentation

## 2010-07-01 DIAGNOSIS — E119 Type 2 diabetes mellitus without complications: Secondary | ICD-10-CM | POA: Insufficient documentation

## 2010-07-01 LAB — BASIC METABOLIC PANEL WITH GFR
BUN: 35 mg/dL — ABNORMAL HIGH (ref 6–23)
CO2: 26 meq/L (ref 19–32)
Calcium: 8.5 mg/dL (ref 8.4–10.5)
Chloride: 106 meq/L (ref 96–112)
Creatinine, Ser: 2.43 mg/dL — ABNORMAL HIGH (ref 0.4–1.2)
GFR calc Af Amer: 24 mL/min — ABNORMAL LOW
GFR calc non Af Amer: 20 mL/min — ABNORMAL LOW
Glucose, Bld: 186 mg/dL — ABNORMAL HIGH (ref 70–99)
Potassium: 3.8 meq/L (ref 3.5–5.1)
Sodium: 140 meq/L (ref 135–145)

## 2010-07-01 LAB — CBC
HCT: 27.6 % — ABNORMAL LOW (ref 36.0–46.0)
Hemoglobin: 8.6 g/dL — ABNORMAL LOW (ref 12.0–15.0)
MCH: 27.6 pg (ref 26.0–34.0)
MCHC: 31.2 g/dL (ref 30.0–36.0)
MCV: 88.5 fL (ref 78.0–100.0)
Platelets: 291 K/uL (ref 150–400)
RBC: 3.12 MIL/uL — ABNORMAL LOW (ref 3.87–5.11)
RDW: 15.3 % (ref 11.5–15.5)
WBC: 15.7 K/uL — ABNORMAL HIGH (ref 4.0–10.5)

## 2010-07-01 LAB — URINALYSIS, ROUTINE W REFLEX MICROSCOPIC
Nitrite: NEGATIVE
Protein, ur: >300 mg/dL — AB
Specific Gravity, Urine: 1.016 (ref 1.005–1.030)
Urobilinogen, UA: 0.2 mg/dL (ref 0.0–1.0)

## 2010-07-01 LAB — URINE MICROSCOPIC-ADD ON

## 2010-07-02 ENCOUNTER — Encounter (HOSPITAL_COMMUNITY): Payer: No Typology Code available for payment source | Attending: Nephrology

## 2010-07-02 ENCOUNTER — Emergency Department (HOSPITAL_COMMUNITY): Payer: No Typology Code available for payment source

## 2010-07-02 ENCOUNTER — Inpatient Hospital Stay (HOSPITAL_COMMUNITY)
Admission: EM | Admit: 2010-07-02 | Discharge: 2010-07-18 | DRG: 603 | Disposition: A | Discharge status: Home / Self Care | Payer: No Typology Code available for payment source | Attending: Internal Medicine | Admitting: Internal Medicine

## 2010-07-02 ENCOUNTER — Emergency Department (HOSPITAL_COMMUNITY): Payer: Medicare (Managed Care)

## 2010-07-02 DIAGNOSIS — I2510 Atherosclerotic heart disease of native coronary artery without angina pectoris: Secondary | ICD-10-CM | POA: Diagnosis present

## 2010-07-02 DIAGNOSIS — Z794 Long term (current) use of insulin: Secondary | ICD-10-CM

## 2010-07-02 DIAGNOSIS — M810 Age-related osteoporosis without current pathological fracture: Secondary | ICD-10-CM | POA: Diagnosis present

## 2010-07-02 DIAGNOSIS — D638 Anemia in other chronic diseases classified elsewhere: Secondary | ICD-10-CM | POA: Insufficient documentation

## 2010-07-02 DIAGNOSIS — I509 Heart failure, unspecified: Secondary | ICD-10-CM | POA: Diagnosis present

## 2010-07-02 DIAGNOSIS — Z7982 Long term (current) use of aspirin: Secondary | ICD-10-CM

## 2010-07-02 DIAGNOSIS — E785 Hyperlipidemia, unspecified: Secondary | ICD-10-CM | POA: Diagnosis present

## 2010-07-02 DIAGNOSIS — I5032 Chronic diastolic (congestive) heart failure: Secondary | ICD-10-CM | POA: Diagnosis present

## 2010-07-02 DIAGNOSIS — G4733 Obstructive sleep apnea (adult) (pediatric): Secondary | ICD-10-CM | POA: Diagnosis present

## 2010-07-02 DIAGNOSIS — N183 Chronic kidney disease, stage 3 unspecified: Secondary | ICD-10-CM | POA: Diagnosis present

## 2010-07-02 DIAGNOSIS — L02219 Cutaneous abscess of trunk, unspecified: Principal | ICD-10-CM | POA: Diagnosis present

## 2010-07-02 DIAGNOSIS — A4901 Methicillin susceptible Staphylococcus aureus infection, unspecified site: Secondary | ICD-10-CM | POA: Diagnosis present

## 2010-07-02 DIAGNOSIS — I129 Hypertensive chronic kidney disease with stage 1 through stage 4 chronic kidney disease, or unspecified chronic kidney disease: Secondary | ICD-10-CM | POA: Diagnosis present

## 2010-07-02 DIAGNOSIS — M109 Gout, unspecified: Secondary | ICD-10-CM | POA: Diagnosis present

## 2010-07-02 DIAGNOSIS — R0602 Shortness of breath: Secondary | ICD-10-CM | POA: Diagnosis present

## 2010-07-02 DIAGNOSIS — E119 Type 2 diabetes mellitus without complications: Secondary | ICD-10-CM | POA: Diagnosis present

## 2010-07-02 DIAGNOSIS — L03319 Cellulitis of trunk, unspecified: Principal | ICD-10-CM | POA: Diagnosis present

## 2010-07-02 LAB — CBC
HCT: 29.1 % — ABNORMAL LOW (ref 36.0–46.0)
MCHC: 32.3 g/dL (ref 30.0–36.0)
Platelets: 280 10*3/uL (ref 150–400)
RDW: 15.2 % (ref 11.5–15.5)
WBC: 14.4 10*3/uL — ABNORMAL HIGH (ref 4.0–10.5)

## 2010-07-02 LAB — DIFFERENTIAL
Basophils Absolute: 0 10*3/uL (ref 0.0–0.1)
Basophils Relative: 0 % (ref 0–1)
Eosinophils Absolute: 0.2 10*3/uL (ref 0.0–0.7)
Eosinophils Relative: 1 % (ref 0–5)
Lymphocytes Relative: 16 % (ref 12–46)
Monocytes Absolute: 1.6 10*3/uL — ABNORMAL HIGH (ref 0.1–1.0)

## 2010-07-02 LAB — URINE MICROSCOPIC-ADD ON

## 2010-07-02 LAB — URINALYSIS, ROUTINE W REFLEX MICROSCOPIC
Glucose, UA: 250 mg/dL — AB
Ketones, ur: NEGATIVE mg/dL
pH: 6.5 (ref 5.0–8.0)

## 2010-07-02 LAB — GLUCOSE, CAPILLARY: Glucose-Capillary: 203 mg/dL — ABNORMAL HIGH (ref 70–99)

## 2010-07-02 LAB — COMPREHENSIVE METABOLIC PANEL
AST: 10 U/L (ref 0–37)
Albumin: 1.7 g/dL — ABNORMAL LOW (ref 3.5–5.2)
Alkaline Phosphatase: 63 U/L (ref 39–117)
BUN: 36 mg/dL — ABNORMAL HIGH (ref 6–23)
Chloride: 105 mEq/L (ref 96–112)
Potassium: 3.6 mEq/L (ref 3.5–5.1)
Total Protein: 6.5 g/dL (ref 6.0–8.3)

## 2010-07-02 LAB — URINE CULTURE
Colony Count: NO GROWTH
Culture: NO GROWTH

## 2010-07-03 LAB — RENAL FUNCTION PANEL
Albumin: 1.6 g/dL — ABNORMAL LOW (ref 3.5–5.2)
BUN: 36 mg/dL — ABNORMAL HIGH (ref 6–23)
Phosphorus: 4.2 mg/dL (ref 2.3–4.6)
Potassium: 3.6 mEq/L (ref 3.5–5.1)

## 2010-07-03 LAB — GLUCOSE, CAPILLARY: Glucose-Capillary: 150 mg/dL — ABNORMAL HIGH (ref 70–99)

## 2010-07-03 LAB — MRSA PCR SCREENING: MRSA by PCR: NEGATIVE

## 2010-07-03 LAB — CBC
MCV: 88.7 fL (ref 78.0–100.0)
Platelets: 280 10*3/uL (ref 150–400)
RDW: 15.3 % (ref 11.5–15.5)
WBC: 12.8 10*3/uL — ABNORMAL HIGH (ref 4.0–10.5)

## 2010-07-04 ENCOUNTER — Inpatient Hospital Stay (HOSPITAL_COMMUNITY): Payer: No Typology Code available for payment source

## 2010-07-04 ENCOUNTER — Inpatient Hospital Stay (HOSPITAL_COMMUNITY)
Admission: RE | Admit: 2010-07-04 | Discharge: 2010-07-04 | Disposition: A | Discharge status: Home / Self Care | Payer: No Typology Code available for payment source | Source: Ambulatory Visit | Attending: General Surgery | Admitting: General Surgery

## 2010-07-04 DIAGNOSIS — L0291 Cutaneous abscess, unspecified: Secondary | ICD-10-CM

## 2010-07-04 LAB — COMPREHENSIVE METABOLIC PANEL
ALT: 7 U/L (ref 0–35)
CO2: 24 mEq/L (ref 19–32)
Calcium: 8.6 mg/dL (ref 8.4–10.5)
Chloride: 107 mEq/L (ref 96–112)
Creatinine, Ser: 2.65 mg/dL — ABNORMAL HIGH (ref 0.4–1.2)
GFR calc Af Amer: 22 mL/min — ABNORMAL LOW (ref >60)
GFR calc non Af Amer: 18 mL/min — ABNORMAL LOW (ref >60)
Glucose, Bld: 86 mg/dL (ref 70–99)
Total Bilirubin: 0.1 mg/dL — ABNORMAL LOW (ref 0.3–1.2)

## 2010-07-04 LAB — GLUCOSE, CAPILLARY
Glucose-Capillary: 89 mg/dL (ref 70–99)
Glucose-Capillary: 110 mg/dL — ABNORMAL HIGH (ref 70–99)

## 2010-07-04 LAB — CBC
Hemoglobin: 9.2 g/dL — ABNORMAL LOW (ref 12.0–15.0)
MCH: 28.1 pg (ref 26.0–34.0)
MCV: 88.1 fL (ref 78.0–100.0)
RBC: 3.27 MIL/uL — ABNORMAL LOW (ref 3.87–5.11)

## 2010-07-04 LAB — URINE CULTURE
Colony Count: NO GROWTH
Culture  Setup Time: 201206130546
Culture: NO GROWTH

## 2010-07-05 LAB — GLUCOSE, CAPILLARY
Glucose-Capillary: 59 mg/dL — ABNORMAL LOW (ref 70–99)
Glucose-Capillary: 76 mg/dL (ref 70–99)
Glucose-Capillary: 122 mg/dL — ABNORMAL HIGH (ref 70–99)

## 2010-07-05 LAB — COMPREHENSIVE METABOLIC PANEL
ALT: 8 U/L (ref 0–35)
Alkaline Phosphatase: 58 U/L (ref 39–117)
BUN: 35 mg/dL — ABNORMAL HIGH (ref 6–23)
CO2: 26 mEq/L (ref 19–32)
GFR calc Af Amer: 24 mL/min — ABNORMAL LOW (ref >60)
GFR calc non Af Amer: 20 mL/min — ABNORMAL LOW (ref >60)
Glucose, Bld: 76 mg/dL (ref 70–99)
Potassium: 4 mEq/L (ref 3.5–5.1)
Total Bilirubin: 0.1 mg/dL — ABNORMAL LOW (ref 0.3–1.2)
Total Protein: 6.3 g/dL (ref 6.0–8.3)

## 2010-07-05 LAB — CBC
HCT: 29 % — ABNORMAL LOW (ref 36.0–46.0)
Hemoglobin: 9.1 g/dL — ABNORMAL LOW (ref 12.0–15.0)
MCHC: 31.4 g/dL (ref 30.0–36.0)
RBC: 3.27 MIL/uL — ABNORMAL LOW (ref 3.87–5.11)

## 2010-07-05 LAB — CULTURE, ROUTINE-ABSCESS

## 2010-07-05 NOTE — H&P (Signed)
[name], [name] NO.:  [id number]  MEDICAL RECORD NO.:  [id number]  LOCATION:                                 FACILITY:  PHYSICIAN:  Alphia Moh, MD   DATE OF BIRTH:  12-Apr-1945  DATE OF ADMISSION: DATE OF DISCHARGE:                             HISTORY & PHYSICAL   PRIMARY CARE PHYSICIAN:  Royetta Crochet. Karlton Lemon, MD  CHIEF COMPLAINT:  Abdominal pain and shortness of breath.  HISTORY OF PRESENT ILLNESS:  [name] is a 65 year old woman with past medical history significant for recent admission in March 2012 for shortness of breath, thought to be secondary to chronic diastolic congestive heart failure as well as an exploratory laparotomy with lysis of adhesion and ventral hernia repair done in April 2012 who presents with chief complaint of abdominal pain and shortness of breath.  Of note, the patient had this surgery on April 25, 2010, and subsequently developed an abdominal wall abscess for which she underwent ultrasound- guided drain placement on Jun 13, 2010.  She reports that ever since she had the drain placed, she has had increasing pain.  She was evaluated in the emergency department yesterday and was given a prescription for Augmentin, but was not able to fill it and returned to the emergency department because of persistent abdominal pain.  Of note, the patient's drain is in place and she reports that she gets approximately 100-200 mL of drainage per day.  On review of systems, her only other complaint is shortness of breath, which is chronic and is only slightly worse than her baseline.  She reports using 2 L of oxygen via nasal cannula at home.  Upon evaluation in the emergency department yesterday and today, she was noted to have leukocytosis of 15.7 and 14.4 today.  Furthermore, she had a CT scan of the abdomen and pelvis, which demonstrates little interval change in the anterior abdominal wall/pannus abscess.  Pigtail drainage catheter  is in the right inferior aspect of the abscess, but no acute intra-abdominal abnormalities are noted.  Of note, the abscess measures 6 cm in the AP x 13.5 cm on transverse imaging, furthermore it is approximately 15 cm in the cranial caudal direction.  The patient lives alone and has an aide, which visits 5 times per week. The patient reports that she has difficulty getting around and getting her daily needs with the current help available.  She voices willingness to go to a skilled nursing facility for short-term if needed.  Upon review of systems, the patient has chronic lower extremity edema which is approximately at baseline.  PAST MEDICAL HISTORY: 1. Chronic kidney disease stage III followed by Dr. Sharion Balloon from     Nephrology. 2. Chronic diastolic congestive heart failure with an EF of 60-65%. 3. Coronary artery disease. 4. Hypertension. 5. Hyperlipidemia. 6. Severe obstructive sleep apnea. 7. Diabetes mellitus, on insulin. 8. Anemia chronic disease.  MEDICATIONS: 1. Procrit subcu every 2 weeks. 2. Prednisone 10 mg p.o. daily.3. Novolin N 20 units subcu b.i.d. 4. Multivitamin 1 tablet p.o. daily. 5. Metoprolol tartrate 50 mg p.o. b.i.d. 6. Lipitor 40 mg p.o. daily. 7. Lasix 80 mg p.o. daily. 8. Isosorbide  mononitrate 60 mg p.o. daily. 9. Ferrous sulfate 325 mg p.o. t.i.d. 10.Docusate 100 mg p.o. daily p.r.n. constipation. 11.Clonidine 0.2 mg half a tablet p.o. b.i.d. 12.Calcium carbonate with vitamin D over-the-counter 1 tablet p.o.     daily. 13.Calcitriol 0.25 mg p.o. daily. 14.Bisacodyl 5 mg p.o. at bedtime p.r.n. 15.Aspirin 81 mg p.o. daily. 16.Amlodipine 5 mg p.o. daily. 17.Albuterol nebulizer 0.083% inhaled t.i.d. p.r.n. shortness of     breath.  ALLERGIES:  NO KNOWN DRUG ALLERGIES.  SOCIAL HISTORY:  The patient lives in [place].  She lives alone.  She has an aide, which visits approximately 5 times per week and there has been a request to increase the  amount of visits, but apparently this was not possible.  She states that she has difficulty getting all her activities of daily living with the current amount of assistance.  She reports a remote history of tobacco use and quit approximately 25 years ago.  She denies any alcohol or drug use.  FAMILY HISTORY:  Noncontributory.  REVIEW OF SYSTEMS:  As mentioned in the HPI, all other systems reviewed and negative.  PHYSICAL EXAMINATION:  VITAL SIGNS:  Temperature T-max in the emergency department of 100.1, blood pressure 150/77, heart rate of 105, respirations 18, oxygen saturation 96% on room air. GENERAL:  This is an obese older woman lying in bed in no acute distress. HEENT:  Head is normocephalic, atraumatic.  Pupils equally round and reactive to light.  Extraocular movements are intact.  Sclerae anicteric. NECK:  Supple.  JVD is unable to be assessed as given the patient's habitus. LUNGS:  Good air movement bilaterally and are clear to auscultation. HEART:  There is a normal S1 and S2 with a regular rate and rhythm. There is a 2/6 systolic murmur in the upper sternal borders. ABDOMEN:  Positive bowel sounds, obese, nondistended.  Patient has overall diffuse tenderness that is more prominent towards the lower pannus.  There is a drain located in the inferior portion of the pannus in midline.  This drain contains purulent material that is light brownish in color.  There is some induration noted in this area that extends inferiorly. EXTREMITIES:  There is +1 to 2 bilateral lower extremity pitting edema. NEUROLOGIC:  The patient is awake, alert and oriented.  Overall, neuro exam is nonfocal.  LABORATORY DATA:  WBC 14.4, hemoglobin 9.4, platelets 280.  Sodium 139, potassium 3.6, chloride 105, bicarb 25, BUN 36, creatinine 2.4, glucose 190, total bilirubin 0.1, alk phos 63, AST 10, ALT 7, total protein 6.5, albumin 1.7, calcium 8.5.  Urinalysis shows 250 of glucose, small  blood greater than 300 of protein, large leukocyte esterase with 11-20 WBCs noted on microscopy, pro-BNP is 2632.  IMAGING: 1. Chest x-ray shows cardiomegaly without evidence for edema.     Otherwise, no focal pulmonary abnormality. 2. CT of the abdomen and pelvis.  IMPRESSION: 1. Little interval change in the anterior abdominal wall/pannus     abscess.  Pigtail drainage catheter is in the right inferior aspect     of the abscess. 2. No acute intra-abdominal abnormality.  ASSESSMENT: 1. Abdominal wall/pannus abscess.  This is the most likely etiology of     the patient's pain given available data and physical exam.  The     patient has the drain in place with ongoing drainage.  She has low-     grade temperature though not significant fever along with a     leukocytosis.  Given the patient's pain and  limited mobility at     home along with her comorbid conditions, we will admit to the     hospital to a regular floor.  We will go ahead and obtain cultures     from the drainage.  We will go ahead and start empiric Zosyn to     cover for gram negatives and anaerobes.  The patient already has     blood cultures as well as a urine culture earlier from yesterday's     ER visit.  Surgery has already been consulted and do not feel,     there is any current surgical intervention that needs to take     place, but will follow and can follow if consult requested, we will     defer to the team that will be following to touch bases with them. 2. Shortness of breath, this is likely multifactorial including the     patient's chronic diastolic heart failure, though she currently has     no evidence of pulmonary edema.  Furthermore, the patient's morbid     obesity as well as severe obstructive sleep apnea are likely     confounders.  Of note, the patient uses 2 L of home O2 and is     currently saturating well.  At this point, we will just monitor as     the patient reports that her shortness of  breath is only minimally     worse than her baseline and there is no objective data that     suggests that there is any other process currently ongoing. 3. Chronic kidney disease stage III, this is currently at baseline.     This has been evaluated and followed by Dr. Eddie Dibbles.  At this point,     we will just monitor. 4. Anemia of chronic disease.  Again, hemoglobin is slightly higher     than prior.  The patient is on erythropoietin and will continue at     home dose. 5. Social situation.  We will go ahead and place clinical social work     consult for possible placement as the patient appears to have     difficulty taking care of her activities of daily living with     current aide available.  May consider PT, OT consults as well in     the morning to further assess and provide input as to the patient's     needs.     Alphia Moh, MD     MA/MEDQ  D:  07/03/2010  T:  07/03/2010  Job:  [id number]  Electronically Signed by Alphia Moh MD on 07/05/2010 11:22:02 AM

## 2010-07-06 LAB — GLUCOSE, CAPILLARY
Glucose-Capillary: 97 mg/dL (ref 70–99)
Glucose-Capillary: 154 mg/dL — ABNORMAL HIGH (ref 70–99)
Glucose-Capillary: 157 mg/dL — ABNORMAL HIGH (ref 70–99)

## 2010-07-07 LAB — COMPREHENSIVE METABOLIC PANEL
Alkaline Phosphatase: 51 U/L (ref 39–117)
Calcium: 8.2 mg/dL — ABNORMAL LOW (ref 8.4–10.5)
Chloride: 113 mEq/L — ABNORMAL HIGH (ref 96–112)
Creatinine, Ser: 2.12 mg/dL — ABNORMAL HIGH (ref 0.50–1.10)
GFR calc Af Amer: 28 mL/min — ABNORMAL LOW (ref >60)
GFR calc non Af Amer: 23 mL/min — ABNORMAL LOW (ref >60)
Sodium: 146 mEq/L — ABNORMAL HIGH (ref 135–145)
Total Protein: 5.9 g/dL — ABNORMAL LOW (ref 6.0–8.3)

## 2010-07-07 LAB — GLUCOSE, CAPILLARY
Glucose-Capillary: 90 mg/dL (ref 70–99)
Glucose-Capillary: 123 mg/dL — ABNORMAL HIGH (ref 70–99)

## 2010-07-07 LAB — CBC
MCV: 88.8 fL (ref 78.0–100.0)
Platelets: 311 10*3/uL (ref 150–400)
RDW: 15.2 % (ref 11.5–15.5)
WBC: 11.7 10*3/uL — ABNORMAL HIGH (ref 4.0–10.5)

## 2010-07-08 LAB — BASIC METABOLIC PANEL
CO2: 21 mEq/L (ref 19–32)
Chloride: 111 mEq/L (ref 96–112)
GFR calc Af Amer: 30 mL/min — ABNORMAL LOW (ref >60)
Potassium: 3.9 mEq/L (ref 3.5–5.1)
Sodium: 141 mEq/L (ref 135–145)

## 2010-07-08 LAB — GLUCOSE, CAPILLARY
Glucose-Capillary: 160 mg/dL — ABNORMAL HIGH (ref 70–99)
Glucose-Capillary: 197 mg/dL — ABNORMAL HIGH (ref 70–99)
Glucose-Capillary: 166 mg/dL — ABNORMAL HIGH (ref 70–99)

## 2010-07-08 LAB — CULTURE, BLOOD (ROUTINE X 2)
Culture  Setup Time: 201206120500
Culture: NO GROWTH

## 2010-07-08 LAB — CBC
MCV: 87.7 fL (ref 78.0–100.0)
Platelets: 340 10*3/uL (ref 150–400)
RBC: 3.83 MIL/uL — ABNORMAL LOW (ref 3.87–5.11)
RDW: 15.5 % (ref 11.5–15.5)
WBC: 13.6 10*3/uL — ABNORMAL HIGH (ref 4.0–10.5)

## 2010-07-08 NOTE — Consult Note (Signed)
[name], [name] NO.:  [id number]  MEDICAL RECORD NO.:  [id number]  LOCATION:  [id number]                         FACILITY:  [facility]  PHYSICIAN:  Sammuel Hines. Daiva Nakayama, M.D. DATE OF BIRTH:  26-Aug-1945  DATE OF CONSULTATION:  07/02/2010 DATE OF DISCHARGE:                                CONSULTATION   [name] is a 65 year old black female who is morbidly obese.  She presents to the ER now twice in the last day.  She has a variety of complaints most notably abdominal pain and shortness of breath.  She was apparently seen yesterday and she did not seem to have an acute surgical problem.  She has a drain in place that was put in by interventional radiologist.  She is about 2 months out from a ventral hernia repair. She had a CT scan yesterday that showed the drain was in good position and the size of the abscess cavity in the subcutaneous space was unchanged.  It was recommended that she start an antibiotic which she did not go fill.  She now comes in also complaining of some shortness of breath and it does not appear that the family is able to take care of her.  She denies any nausea or vomiting.  She is having normal bowel movements.  PAST MEDICAL HISTORY:  Significant for congestive heart failure, morbid obesity, diabetes, gout, hypertension, arthritis.  PAST SURGICAL HISTORY:  Significant for ventral hernia repair about 2 months ago with explantation of mesh and subsequent drainage of a subcutaneous abscess.  MEDICATIONS:  The patient cannot remember.  ALLERGIES:  No known drug allergies.  SOCIAL HISTORY:  She denies use of alcohol or tobacco products.  FAMILY HISTORY:  Noncontributory.  PHYSICAL EXAMINATION:  VITAL SIGNS:  Her temperature is 100.1, pulse 105, blood pressure 150/77. GENERAL:  She is morbidly obese but she looks fine, sitting in bed, eating. SKIN:  Warm and dry.  No jaundice. EYES:  Her extraocular muscles are intact.  Pupils are equal,  round, reactive to light.  Sclerae nonicteric. LUNGS:  Clear bilaterally.  She seems a little bit mildly short of breath. HEART:  Has a regular rate and rhythm with a systolic murmur, impulse in the left chest. ABDOMEN:  Soft but she is moderately tender, essentially around her incision site.  There is no real redness to the skin.  Her drain is draining about 100 mL of purulent material a day. EXTREMITIES:  She has got pitting edema of both lower extremities. PSYCHOLOGICAL:  She is alert and oriented x3, although she is a very poor historian.  On review of her lab work, her white count was 14.4, creatinine is 2.4. Her BNP is 2632.  A CT done yesterday was unchanged from previous CTs and showed the drain in the subcutaneous abscess cavity  ASSESSMENT/PLAN:  I do think she has subcutaneous abscess that is well controlled.  She does not appear to have any acute surgical problem today.  I get the feeling that she does have a urinary tract infection and probably some uncontrolled congestive heart failure and I think her family is not able to really care for her.  I think  she probably needs a medical admission and we can follow along and appreciate any help that the medical team can give [place] with her.     Sammuel Hines. Daiva Nakayama, M.D.     PST/MEDQ  D:  07/02/2010  T:  07/03/2010  Job:  [id number]  Electronically Signed by Autumn Messing III M.D. on 07/08/2010 07:34:39 AM

## 2010-07-09 ENCOUNTER — Inpatient Hospital Stay (HOSPITAL_COMMUNITY): Payer: Medicare (Managed Care)

## 2010-07-09 ENCOUNTER — Inpatient Hospital Stay (HOSPITAL_COMMUNITY): Payer: No Typology Code available for payment source

## 2010-07-09 LAB — GLUCOSE, CAPILLARY: Glucose-Capillary: 138 mg/dL — ABNORMAL HIGH (ref 70–99)

## 2010-07-09 LAB — CBC
MCH: 29.2 pg (ref 26.0–34.0)
MCHC: 33.1 g/dL (ref 30.0–36.0)
RDW: 15.6 % — ABNORMAL HIGH (ref 11.5–15.5)

## 2010-07-10 ENCOUNTER — Encounter (INDEPENDENT_AMBULATORY_CARE_PROVIDER_SITE_OTHER): Payer: Self-pay | Admitting: General Surgery

## 2010-07-10 LAB — CBC
Hemoglobin: 10.6 g/dL — ABNORMAL LOW (ref 12.0–15.0)
MCH: 28.8 pg (ref 26.0–34.0)
MCV: 88.3 fL (ref 78.0–100.0)
RBC: 3.68 MIL/uL — ABNORMAL LOW (ref 3.87–5.11)

## 2010-07-10 LAB — COMPREHENSIVE METABOLIC PANEL
ALT: 17 U/L (ref 0–35)
Alkaline Phosphatase: 42 U/L (ref 39–117)
CO2: 23 mEq/L (ref 19–32)
Calcium: 8.2 mg/dL — ABNORMAL LOW (ref 8.4–10.5)
GFR calc Af Amer: 27 mL/min — ABNORMAL LOW (ref >60)
GFR calc non Af Amer: 23 mL/min — ABNORMAL LOW (ref >60)
Glucose, Bld: 110 mg/dL — ABNORMAL HIGH (ref 70–99)
Sodium: 143 mEq/L (ref 135–145)

## 2010-07-10 LAB — GLUCOSE, CAPILLARY: Glucose-Capillary: 109 mg/dL — ABNORMAL HIGH (ref 70–99)

## 2010-07-11 LAB — GLUCOSE, CAPILLARY
Glucose-Capillary: 104 mg/dL — ABNORMAL HIGH (ref 70–99)
Glucose-Capillary: 188 mg/dL — ABNORMAL HIGH (ref 70–99)

## 2010-07-12 LAB — GLUCOSE, CAPILLARY
Glucose-Capillary: 113 mg/dL — ABNORMAL HIGH (ref 70–99)
Glucose-Capillary: 109 mg/dL — ABNORMAL HIGH (ref 70–99)
Glucose-Capillary: 135 mg/dL — ABNORMAL HIGH (ref 70–99)

## 2010-07-12 LAB — CBC
HCT: 27.3 % — ABNORMAL LOW (ref 36.0–46.0)
Hemoglobin: 9 g/dL — ABNORMAL LOW (ref 12.0–15.0)
MCH: 29.3 pg (ref 26.0–34.0)
MCV: 88.9 fL (ref 78.0–100.0)
RBC: 3.07 MIL/uL — ABNORMAL LOW (ref 3.87–5.11)

## 2010-07-13 ENCOUNTER — Inpatient Hospital Stay (HOSPITAL_COMMUNITY): Payer: Medicare (Managed Care)

## 2010-07-14 LAB — GLUCOSE, CAPILLARY
Glucose-Capillary: 84 mg/dL (ref 70–99)
Glucose-Capillary: 107 mg/dL — ABNORMAL HIGH (ref 70–99)
Glucose-Capillary: 140 mg/dL — ABNORMAL HIGH (ref 70–99)

## 2010-07-15 LAB — GLUCOSE, CAPILLARY: Glucose-Capillary: 112 mg/dL — ABNORMAL HIGH (ref 70–99)

## 2010-07-15 NOTE — Group Therapy Note (Signed)
[name], [name]  MEDICAL RECORD NO.:  [id number]  LOCATION:  [id number]                         FACILITY:  [facility]  PHYSICIAN:  Verneita Griffes, MD        DATE OF BIRTH:  Aug 28, 1945                                PROGRESS NOTE   DIAGNOSES: Today are; 1. Methicillin-susceptible Staphylococcus aureus abscess versus seroma     followed by General Surgery, Dr. Donne Hazel. 2. Congestive heart failure, ejection fraction 65% on April 01, 2010. 3. Hypertension. 4. Gout with me having discontinued her prednisone. 5. Diabetes mellitus. 6. Obstructive sleep apnea. 7. Coronary artery disease with normal coronaries, followed by Dr.     Doylene Canard as an outpatient. 8. Hyperlipidemia. 9. Osteoporosis. 10.Poor nutritional status.  DISCHARGE MEDICATIONS: Will be dictated by discharging physician.  However, the patient is currently on; 1. Amlodipine 10. 2. Aspirin 81. 3. Calcitriol 0.25 daily. 4. Calcium carbonate 1 tab daily. 5. Clonidine 1 tab b.i.d. 6. Lovenox 40 b.i.d. 7. Ferrous sulfate 325 daily. 8. Lasix 80 q.a.m. and 40 q.p.m. 9. Insulin sliding scale 1-9 units t.i.d. 10.Lantus 10 units. 11.Isosorbide mononitrate 60 mg daily. 12.Toprol-XL 50 daily. 13.Currently on nafcillin 2 g q.6 hourly, which has recently been     changed. 14.Multivitamin. 15.Glucerna. 16.Crestor 20. 17.The patient is on p.r.n. pain medication hydrocodone/APAP.  IMAGING STUDIES: 1. Until date include chest x-ray July 01, 2010, showing cardiomegaly     and pulmonary vascular congestion.  No acute pulmonary abnormality. 2. CT abdomen and pelvis showing little infiltration of anterior     abdominal wall, pannus abscess, pigtail drained catheter is in     right inferior aspect of the abscess.  No acute intracranial     abnormality. 3. Chest x-ray 2-view showed cardiomegaly without evidence of edema.     No focal pulmonary abnormality. 4. Ultrasound of the abscess drainage  showed complex ventral     subcutaneous abscess placed as 12-French catheter with minimal     drainage fluid.  CONSULTATIONS: Pertinent consultants on this case are Dr. Donne Hazel in [facility] as well as interventional radiologist Dr. Markus Daft.  HISTORY: Briefly, this is a 65 year old female with multiple comorbidities, who presented recently to [facility], had ex-lap with lysis of adhesions and ventral repair on April 2012, presented with abdominal pain, shortness of breath, subsequently underwent abdominal wall abscess drain placement, and ever since she has had drain placed.  She has had increasing pain.  She has been given prescription for Augmentin, return to the ED because of this.  Her only issue initially was shortness of breath.  She uses 2 L of oxygen per day.  She had leukocytosis 15 and noted abscess measured 6 x 13.  She lives at home, has maid who comes 5 times a week, but has had difficulty getting around and voiced that she would be willing to be followed in the nursing facility if needed. Please see full dictation, [id number].BRIEF HOSPITAL COURSE: 1. Abscess.  The patient has been on various antibiotics including     clindamycin, which was subsequently transitioned to Unasyn.  Now,     she is on oxacillin.  I have  asked General Surgery to review her     for possible further surgical intervention as Dr. Anselm Pancoast of IR has     already done what he can for abdomen.  They are planning to rescan     on Wednesday, and if WBC is down and CT is okay, they will go to.     If this fails, Dr. Donne Hazel has voiced the last option would be to     return to the OR.  However, I am not that optimistic and would     hesitate to discharge her to a nursing facility on p.o. antibiotics 2. Diarrhea.  The patient has been experiencing diarrhea for past 2     days.  Initially, it was thought to be secondary to the effect of     clindamycin.  However, she had 7 episodes yesterday and  3 episodes     today, she stated watery.  I would like to check stool diff and     stool studies to ensure that there is no other intra-abdominal     process going on as she has been on antibiotics.  In addition, she     has also been on prednisone for her gout, whihc would decrease her immunity,     therefore I have wened steroids and hopefully this willaid in her ability to fight infection 3. CHF, EF of 65%- The patient is on Lasix 80 mg q.a.m. and 40 q.p.m. and she is     euvolemic at present.  She may have an element of decreased oncotic     pressure secondary to hyperproteinemia, which may also worsen her edema 5. Gout.  We have tapered her prednisone.  Discontinued it to aide in     healing.  She has no further episodes with regards to her gout. 6. Diabetes mellitus.  Her medications have been reconciled and she is     currently on low-dose Lantus to cover for her blood sugar.  She     will continue with the same. 7. Hypertension.  This is not well controlled.  Her blood pressures     have been slightly elevated.  I would like to wean her off     clonidine if possible.  However, she continues to be within the     [id number] range, diastolic over [id number], and we may have to add     thiazide to her medications as well.  With her being in pain, this     would also be a reason for to have elevated blood pressures. 8. Obstructive sleep apnea.  She is on BiPAP 10:05 at night and is     doing well.  She NEEDS outpatient pulmonary followup.  If she     goes to nursing home, please arrange for BiPAP at the nursing home. 9. Normal coronary arteries, but seen by Dr. Doylene Canard.  The patient     will follow up with Dr. Doylene Canard as an outpatient.  She has had     cardiac cath done.  She has had a cardiac evaluation done in 2007,     which showed normal coronaries and positive nuclear stress test. 10.Hyperlipidemia.  She will continue her Crestor.  PHYSICAL EXAMINATION: GENERAL:  The patient seen on  July 09, 2010, doing well. VITAL SIGNS:  Temperature 98.5, which is down from prior, pulse 79, respirations 18, blood pressure 157-173/83-93, satting 100% on room air. CHEST:  Clinically clear. NECK:  Thick. HEENT:  No pallor.  No icterus. ABDOMEN:  Soft, slightly tender over the area of the pigtail catheter. LOWER EXTREMITIES:  Showed grade 2 edema.  LABORATORY DATA: Blood sugars 166.  CBC showed WBC 12.8, hemoglobin 9.99, hematocrit 29.9, and platelet count 321 today.  Blood cultures are negative.  BMP; sodium 141, potassium 3.9, BUN and creatinine 32/2.1.  The patient does have baseline chronic kidney disease and this is in fact the best she has been.  She has hypocalcemia as well.  PLAN: It was pleasure taking care of this patient.  We will defer to Surgery for further course of care, and hopefully, she can be discharged to nursing home soon.  I would not recommend placing her on IV antibiotics long term given the fact that we do not know if she has C. diff or not. Also, I believe that she may benefit from overt surgical intervention and this should be discussed with Dr. Donne Hazel.          ______________________________ Verneita Griffes, MD     JS/MEDQ  D:  07/09/2010  T:  07/09/2010  Job:  [id number]  Electronically Signed by Verneita Griffes MD on 07/15/2010 07:52:07 PM

## 2010-07-16 LAB — GLUCOSE, CAPILLARY
Glucose-Capillary: 94 mg/dL (ref 70–99)
Glucose-Capillary: 122 mg/dL — ABNORMAL HIGH (ref 70–99)

## 2010-07-17 LAB — GLUCOSE, CAPILLARY
Glucose-Capillary: 75 mg/dL (ref 70–99)
Glucose-Capillary: 128 mg/dL — ABNORMAL HIGH (ref 70–99)
Glucose-Capillary: 105 mg/dL — ABNORMAL HIGH (ref 70–99)
Glucose-Capillary: 103 mg/dL — ABNORMAL HIGH (ref 70–99)

## 2010-07-17 NOTE — Discharge Summary (Signed)
[name], [name] NO.:  [id number]  MEDICAL RECORD NO.:  [id number]  LOCATION:  [id number]                         FACILITY:  [facility]  PHYSICIAN:  Charlynne Cousins, M.D.DATE OF BIRTH:  04-Mar-1945  DATE OF ADMISSION:  07/02/2010 DATE OF DISCHARGE:  07/17/2010                              DISCHARGE SUMMARY   PRIMARY CARE DOCTOR:  Joelene Millin R. Karlton Lemon, MD  DISCHARGE DIAGNOSES: 1. Methicillin-susceptible Staphylococcus aureus abscess of the     abdominal wall. 2. Chronic congestive heart failure with an EF of 65%. 3. Hypertension. 4. Gout. 5. Diabetes mellitus. 6. Obstructive sleep apnea. 7. Coronary artery disease. 8. Hyperlipidemia.  DISCHARGE MEDICATIONS: 1. Amlodipine 10 mg p.o. daily. 2. Cefuroxime 500 mg p.o. b.i.d. for 21 days 3. NovoLog 1-9 units t.i.d. sliding scale. 4. Lisinopril 5 mg daily. 5. Albuterol inhaled t.i.d. 6. Aspirin 81 mg daily. 7. Bisacodyl 5 mg at bedtime. 8. Calcitriol 0.25 mg daily. 9. Calcium carbonate 1 tablet daily. 10.Clonidine 0.2 mg half a tablet b.i.d. 11.Docusate 100 mg p.o. daily. 12.Ferrous sulfate 325 mg p.o. daily. 13.Isosorbide mononitrate XR 60 mg daily. 14.Lasix 80 mg daily. 15.Lipitor 40 mg daily. 16.Metoprolol 50 mg daily. 17.Multivitamin 1 tab daily. 18.Novolin NPH 20 units b.i.d. 19.Procrit one shot every week. 20.Prednisone 10 mg daily.  PROCEDURES PERFORMED: 1. Chest x-ray showed cardiomegaly with pulmonary vascular congestion. 2. CT scan of the abdomen and pelvis on July 02, 2010, showed little     interval change in the anterior abdominal wall pain.  Has abscess     pigtail drain catheter in the right inferior aspect of the abscess.     No acute abnormalities. 3. Chest x-ray on July 02, 2010, showed no acute evidence of edema. 4. Abdominal ultrasound show impression of complex ventral     subcutaneous abscess, placement of 12-French catheter with minimal     fluid drainage. 5. CT scan of the  abdomen on July 09, 2010, showed new complete     resolution of fluid collection of ventral abdominal wall, two     percutaneous drainage catheter remained in position.  BRIEF ADMITTING HISTORY OF PRESENT ILLNESS:  This is a 65 year old woman with a past medical history of admission on April 01, 2010, for shortness of breath, thought to be secondary to diastolic heart failure as well as exploratory laparotomy, lysis of adhesion, and ventral hernia repair in April 2012, who presents with chief complaint of abdominal pain and shortness of breath.  Of note, the patient had surgery on April 25, 2010, and subsequently developed abdominal wall abscess, for which underwent ultrasound-guided drain placement on Jun 13, 2010, and she reports ever since she had the drain placed, she had increased pain. She was evaluated in the emergency room yesterday and was given prescription for Augmentin, but was not able to fill so she returns to the emergency room.  Of note, the patient drained fluids  about 100-200 mL per day.  So we were asked to admit and further evaluate.  BRIEF HOSPITAL COURSE: 1. Abscess.  The patient had been on various antibiotics including     clindamycin, which subsequently was transitioned to Unasyn, now on  oxacillin.  I have asked General Surgery to review the possibility     of surgical intervention with Dr. Anselm Pancoast.  They have already done     with a scan for the abdomen.  They are applying to rescan next week     to evaluate by Dr. Donne Hazel that because of her increased drainage     did not want to take the drain out until this has decreased     significantly.  IR also feels the same way.  So she will go home to     a nursing home with drain.  Once the drain is decreased, she will     get a CT scan and will follow up with Dr. Donne Hazel as an     outpatient.  She will continue cefuroxime for a total 21 days. 2. Congestive heart failure, currently stable.  She will continue  on     Lasix.  She remains euvolemic and continue her beta-blocker. 3. Hypertension, currently well controlled.  Her amlodipine was     increased.  Her lisinopril was added and her blood pressures     remained controlled.  We will titrate lisinopril as needed as an     outpatient. 4. Diabetes type 2, currently well controlled with insulin.  We will     continue current treatment. 5. Obstructive sleep apnea.  We will continue BiPAP at night.  Vitals on day of discharge shows a temperature of 98, pulse 65, respirations of 18, blood pressure 137/63.  She was satting 100% on room air.  Labs on day of discharge showed none.  DISPOSITION:  The patient will follow up with Dr. Donne Hazel in 2 weeks. Here we will evaluate her drainage to see how much she is draining.  will also go to SNF withdrain care and we will get a CT scan when the drain output has decreased. Will need three time a day flush with  Normal saline 10cc.    Charlynne Cousins, M.D.    AF/MEDQ  D:  07/15/2010  T:  07/15/2010  Job:  [id number]  cc:   Dickey Gave, MD  Electronically Signed by Charlynne Cousins M.D. on 07/17/2010 01:18:17 PM

## 2010-07-18 NOTE — Discharge Summary (Signed)
  [name], [name] NO.:  [id number]  MEDICAL RECORD NO.:  [id number]  LOCATION:  [id number]                         FACILITY:  [facility]  PHYSICIAN:  Jackie Plum, MD  DATE OF BIRTH:  12/05/1945  DATE OF ADMISSION:  07/02/2010 DATE OF DISCHARGE:  07/18/2010                        DISCHARGE SUMMARY - REFERRING   ADDENDUM:  PRIMARY CARE PHYSICIAN:  Dr. Salena Saner  PRIMARY ATTENDING:  Charlynne Cousins, M.D.  Please for discharge diagnoses and medications as well as hospital course on this patient refer to the discharge summary dictated by Dr. Aileen Fass on July 15, 2010.  The patient was however seen by me for the very first time in this admission today which is on July 18, 2010, denied any complaint.  PHYSICAL EXAMINATION:  GENERAL:  The patient showed to have profound pedal edema. VITAL SIGNS:  Blood pressure is 144/99, pulse 68, respiratory rate 16, temperature is 98.3.  LABS PRIOR TO DISCHARGE:  Done on July 12, 2010 include:  Hematological indices which showed WBC of 10.2, hemoglobin of 9.0, hematocrit of 27.3, MCV of 88.9, platelet count of 281.  The patient is however medically stable.  Plan is for the patient to be discharged today on activity as tolerated daily with fluid restriction.  The patient was advised to watch out for signs and symptoms of congestive heart failure, which include shortness of breath, congestion in the lung and progressive swelling of the lower extremity and should she develop any of these symptoms she should report to the hospital to be evaluated.  She will be followed up by her primary care physician in 1-2 weeks.  Medication to be taken is as dictated, the patient's chart dictated inpatient discharge summary done by Dr. Aileen Fass on July 15, 2010.     Jackie Plum, MD     CN/MEDQ  D:  07/18/2010  T:  07/18/2010  Job:  [id number]  Electronically Signed by Jackie Plum  on 07/18/2010 07:31:12  PM

## 2010-07-26 ENCOUNTER — Telehealth (INDEPENDENT_AMBULATORY_CARE_PROVIDER_SITE_OTHER): Payer: Self-pay | Admitting: General Surgery

## 2010-07-26 NOTE — Telephone Encounter (Signed)
[name], pt called to make a follow up appt for 2wks after her sx on 6/28, I was going to giver her 08/07/10 @ 4:20pm but it wouldn't let me give it to her, please call her if she can still come at that time or if you need to change it.

## 2010-07-29 ENCOUNTER — Telehealth (INDEPENDENT_AMBULATORY_CARE_PROVIDER_SITE_OTHER): Payer: Self-pay | Admitting: General Surgery

## 2010-07-29 NOTE — Telephone Encounter (Signed)
Called patient, made her aware I scheduled appt for her 08/07/10 @ 9:40am.

## 2010-07-29 NOTE — Telephone Encounter (Signed)
Message copied by Margarette Asal on Mon Jul 29, 2010 11:59 AM ------      Message from: Jackquline Denmark      Created: Fri Jul 26, 2010  1:08 PM      Regarding: appt       Pt called to sched a po appt for 2wks from her sx on 6/28.  I tried giving her and appt for 7/18 @ 4:20pm but the system wouldn't let me schedule it, if you can see her that day could you put her in or if you can see her some other day, please call.            Thanks,      [employer]

## 2010-07-30 ENCOUNTER — Encounter (HOSPITAL_COMMUNITY): Payer: Medicare (Managed Care) | Attending: Nephrology

## 2010-07-30 ENCOUNTER — Other Ambulatory Visit: Payer: Self-pay | Admitting: Nephrology

## 2010-07-30 DIAGNOSIS — D638 Anemia in other chronic diseases classified elsewhere: Secondary | ICD-10-CM | POA: Insufficient documentation

## 2010-07-30 DIAGNOSIS — N183 Chronic kidney disease, stage 3 unspecified: Secondary | ICD-10-CM | POA: Insufficient documentation

## 2010-08-07 ENCOUNTER — Encounter (INDEPENDENT_AMBULATORY_CARE_PROVIDER_SITE_OTHER): Payer: Self-pay | Admitting: General Surgery

## 2010-08-07 ENCOUNTER — Ambulatory Visit (INDEPENDENT_AMBULATORY_CARE_PROVIDER_SITE_OTHER): Payer: No Typology Code available for payment source | Admitting: General Surgery

## 2010-08-07 DIAGNOSIS — Z09 Encounter for follow-up examination after completed treatment for conditions other than malignant neoplasm: Secondary | ICD-10-CM

## 2010-08-07 NOTE — Progress Notes (Signed)
Subjective:     Patient ID: Myrtis Ser, female   DOB: 08-Apr-1945, 65 y.o.   MRN: [id number]  HPIThis is a 65 year old female who in April underwent exploratory laparotomy for incarcerated hernia causing a bowel obstruction with lysis of adhesions.  I explained to her old mesh and then did a primary ventral hernia repair with Stratus mesh onlay. She is having a lot of issues with seromas. She was admitted to the hospital this last time and it has been in a couple weeks in the hospital for antibiotics and drainage. She comes back to see me today doing much better. Her drainage is significantly decreased from the CT guided drain. She is  not having any nausea or vomiting and having normal bowel movements. She finally feels mostly back to her normal self.   Review of Systems     Objective:   Physical Exam    abdomen with well healed midline wound, no infection, no seroma, obese, nontender Drain with minimal serous fluid present Assessment:     S/p ventral hernia repair for bowel obstruction seroma    Plan:        I removed her drain today. He was really put in a whole lot. Hopefully this is due to be completely resolved at this point. She can continue to wear her binder. Im going to see her back in about 3 weeks for hopefully what will be a final check.

## 2010-08-13 ENCOUNTER — Other Ambulatory Visit: Payer: Self-pay | Admitting: Nephrology

## 2010-08-13 ENCOUNTER — Encounter (HOSPITAL_COMMUNITY): Payer: No Typology Code available for payment source

## 2010-08-13 LAB — RENAL FUNCTION PANEL
BUN: 35 mg/dL — ABNORMAL HIGH (ref 6–23)
CO2: 22 mEq/L (ref 19–32)
Chloride: 113 mEq/L — ABNORMAL HIGH (ref 96–112)
Creatinine, Ser: 2.43 mg/dL — ABNORMAL HIGH (ref 0.50–1.10)
Glucose, Bld: 88 mg/dL (ref 70–99)

## 2010-08-13 LAB — IRON AND TIBC
Iron: 57 ug/dL (ref 42–135)
Saturation Ratios: 32 % (ref 20–55)
UIBC: 121 ug/dL

## 2010-08-13 LAB — FERRITIN: Ferritin: 1426 ng/mL — ABNORMAL HIGH (ref 10–291)

## 2010-08-22 ENCOUNTER — Encounter (INDEPENDENT_AMBULATORY_CARE_PROVIDER_SITE_OTHER): Payer: Self-pay | Admitting: General Surgery

## 2010-08-26 ENCOUNTER — Ambulatory Visit (INDEPENDENT_AMBULATORY_CARE_PROVIDER_SITE_OTHER): Payer: Medicare (Managed Care) | Admitting: General Surgery

## 2010-08-26 ENCOUNTER — Encounter (INDEPENDENT_AMBULATORY_CARE_PROVIDER_SITE_OTHER): Payer: Self-pay | Admitting: General Surgery

## 2010-08-26 DIAGNOSIS — Z09 Encounter for follow-up examination after completed treatment for conditions other than malignant neoplasm: Secondary | ICD-10-CM

## 2010-08-26 DIAGNOSIS — K430 Incisional hernia with obstruction, without gangrene: Secondary | ICD-10-CM

## 2010-08-26 NOTE — Progress Notes (Signed)
Subjective:     Patient ID: Myrtis Ser, female   DOB: [birth year], 66 y.o.   MRN: [id number]  HPI This is a 65 year old female who in April I did an exploratory laparotomy with lysis of adhesions explantation of old mesh and a primary ventral hernia repair with onlay mesh. Postoperatively she had a long course including a seroma that was infected having multiple drains. I removed her last drain at last visit she reports no problems since then. Her appetite is returning to normal. She's having normal bowel movements and no nausea or vomiting. She describes no fevers.  Review of Systems     Objective:   Physical Exam Well healed incision, no evidence seroma, no infection    Assessment:     S/p ventral hernia repair complicated by infected seroma    Plan:     I released her to full activity and she is going to come back and see me as needed.

## 2010-08-27 ENCOUNTER — Other Ambulatory Visit: Payer: Self-pay | Admitting: Nephrology

## 2010-08-27 ENCOUNTER — Encounter (HOSPITAL_COMMUNITY)
Admission: RE | Admit: 2010-08-27 | Discharge: 2010-08-27 | Disposition: A | Discharge status: Home / Self Care | Payer: Medicare (Managed Care) | Source: Ambulatory Visit | Attending: Nephrology | Admitting: Nephrology

## 2010-08-27 DIAGNOSIS — N183 Chronic kidney disease, stage 3 unspecified: Secondary | ICD-10-CM | POA: Insufficient documentation

## 2010-08-27 DIAGNOSIS — D638 Anemia in other chronic diseases classified elsewhere: Secondary | ICD-10-CM | POA: Insufficient documentation

## 2010-09-10 ENCOUNTER — Other Ambulatory Visit: Payer: Self-pay | Admitting: Nephrology

## 2010-09-10 ENCOUNTER — Encounter (HOSPITAL_COMMUNITY)
Admission: RE | Admit: 2010-09-10 | Discharge: 2010-09-10 | Payer: No Typology Code available for payment source | Source: Ambulatory Visit | Attending: Nephrology | Admitting: Nephrology

## 2010-09-24 ENCOUNTER — Other Ambulatory Visit: Payer: Self-pay | Admitting: Nephrology

## 2010-09-24 ENCOUNTER — Encounter (HOSPITAL_COMMUNITY): Payer: No Typology Code available for payment source | Attending: Nephrology

## 2010-09-24 DIAGNOSIS — D638 Anemia in other chronic diseases classified elsewhere: Secondary | ICD-10-CM | POA: Insufficient documentation

## 2010-09-24 DIAGNOSIS — N183 Chronic kidney disease, stage 3 unspecified: Secondary | ICD-10-CM | POA: Insufficient documentation

## 2010-10-08 ENCOUNTER — Encounter (HOSPITAL_COMMUNITY): Payer: Medicare (Managed Care)

## 2010-10-10 ENCOUNTER — Ambulatory Visit (INDEPENDENT_AMBULATORY_CARE_PROVIDER_SITE_OTHER): Payer: Medicare HMO | Admitting: General Surgery

## 2010-10-10 ENCOUNTER — Encounter (INDEPENDENT_AMBULATORY_CARE_PROVIDER_SITE_OTHER): Payer: Self-pay | Admitting: General Surgery

## 2010-10-10 VITALS — BP 158/72 | HR 84 | Temp 97.0°F | Resp 18 | Ht 65.0 in | Wt 276.0 lb

## 2010-10-10 DIAGNOSIS — R1084 Generalized abdominal pain: Secondary | ICD-10-CM

## 2010-10-10 LAB — CBC WITH DIFFERENTIAL/PLATELET
Basophils Absolute: 0 10*3/uL (ref 0.0–0.1)
Basophils Relative: 0 % (ref 0–1)
Eosinophils Relative: 2 % (ref 0–5)
HCT: 29.6 % — ABNORMAL LOW (ref 36.0–46.0)
Hemoglobin: 9.3 g/dL — ABNORMAL LOW (ref 12.0–15.0)
Lymphocytes Relative: 18 % (ref 12–46)
MCHC: 31.4 g/dL (ref 30.0–36.0)
MCV: 88.4 fL (ref 78.0–100.0)
Monocytes Absolute: 0.8 10*3/uL (ref 0.1–1.0)
Monocytes Relative: 8 % (ref 3–12)
RDW: 17.6 % — ABNORMAL HIGH (ref 11.5–15.5)

## 2010-10-10 LAB — BASIC METABOLIC PANEL
BUN: 55 mg/dL — ABNORMAL HIGH (ref 6–23)
Chloride: 115 mEq/L — ABNORMAL HIGH (ref 96–112)
Creat: 2.86 mg/dL — ABNORMAL HIGH (ref 0.50–1.10)
Glucose, Bld: 106 mg/dL — ABNORMAL HIGH (ref 70–99)

## 2010-10-10 MED ORDER — HYDROCODONE-ACETAMINOPHEN 5-500 MG PO TABS: 1.0000 | ORAL_TABLET | ORAL | Status: DC | PRN

## 2010-10-10 NOTE — Progress Notes (Signed)
Addended by: Illene Regulus on: 10/10/2010 11:49 AM   Modules accepted: Orders

## 2010-10-10 NOTE — Progress Notes (Signed)
Subjective:     Patient ID: [name], female   DOB: 06-29-1945, 65 y.o.   MRN: [id number]  HPI This is a 65 year old female with multiple medical problems who I saw as a consult in the hospital. She had an incarcerated ventral hernia and was taken to the operating room where she underwent a ventral hernia repair with primary closure and biologic mesh. She had a long postoperative course with multiple subcutaneous abdominal wall infections which were drained which have now been cleared. She's been doing pretty well but over the last couple of days she describes new onset of abdominal pain around her incision. She does not describe a new mass or lump. She describes no fevers. She is having bowel movements and passing flatus. Her appetite is not normal but she is eating and has no nausea or vomiting.  Review of Systems     Objective:   Physical Exam obese abdomen with well healed incision no drainage or infection, difficult to assess for ventral hernia or ab wall collection   Assessment:     Abdominal pain    Plan:        It is hard to assess what is going on with her exam due to her body habitus. This may very well just be postoperative in nature. Due to her postoperative course that she had I am going to proceed and obtain a CT scan of her abdomen pelvis and will follow up with her after that.

## 2010-10-14 ENCOUNTER — Ambulatory Visit
Admission: RE | Admit: 2010-10-14 | Discharge: 2010-10-14 | Disposition: A | Discharge status: Home / Self Care | Payer: Medicare (Managed Care) | Source: Ambulatory Visit | Attending: General Surgery | Admitting: General Surgery

## 2010-10-14 DIAGNOSIS — R1084 Generalized abdominal pain: Secondary | ICD-10-CM

## 2010-10-16 ENCOUNTER — Telehealth (INDEPENDENT_AMBULATORY_CARE_PROVIDER_SITE_OTHER): Payer: Self-pay

## 2010-10-16 NOTE — Telephone Encounter (Signed)
Called pt to check on her after CT scan was done Monday and she is still not any better with her abdominal pain. I asked the pt if she was having any fevers or chills and she has not felt like she has. I notified pt that her CT scan did show a fluid collection which could be a abscess per Dr Donne Hazel. I explained to the pt that we needed to schedule her for a procedure done at the hospital for drain to be placed in the fluid collection. I told the pt I would work on this and call her back. The pt understands and wishes to proceed with scheduling./ [facility]

## 2010-10-18 ENCOUNTER — Telehealth (INDEPENDENT_AMBULATORY_CARE_PROVIDER_SITE_OTHER): Payer: Self-pay

## 2010-10-18 ENCOUNTER — Other Ambulatory Visit (INDEPENDENT_AMBULATORY_CARE_PROVIDER_SITE_OTHER): Payer: Self-pay | Admitting: General Surgery

## 2010-10-18 ENCOUNTER — Ambulatory Visit (HOSPITAL_COMMUNITY)
Admission: RE | Admit: 2010-10-18 | Discharge: 2010-10-18 | Disposition: A | Discharge status: Home / Self Care | Payer: No Typology Code available for payment source | Source: Ambulatory Visit | Attending: General Surgery | Admitting: General Surgery

## 2010-10-18 ENCOUNTER — Ambulatory Visit (HOSPITAL_COMMUNITY): Payer: Medicare (Managed Care)

## 2010-10-18 DIAGNOSIS — I2510 Atherosclerotic heart disease of native coronary artery without angina pectoris: Secondary | ICD-10-CM | POA: Insufficient documentation

## 2010-10-18 DIAGNOSIS — L03319 Cellulitis of trunk, unspecified: Secondary | ICD-10-CM | POA: Insufficient documentation

## 2010-10-18 DIAGNOSIS — E785 Hyperlipidemia, unspecified: Secondary | ICD-10-CM | POA: Insufficient documentation

## 2010-10-18 DIAGNOSIS — I10 Essential (primary) hypertension: Secondary | ICD-10-CM | POA: Insufficient documentation

## 2010-10-18 DIAGNOSIS — L02219 Cutaneous abscess of trunk, unspecified: Secondary | ICD-10-CM | POA: Insufficient documentation

## 2010-10-18 DIAGNOSIS — E119 Type 2 diabetes mellitus without complications: Secondary | ICD-10-CM | POA: Insufficient documentation

## 2010-10-18 DIAGNOSIS — I509 Heart failure, unspecified: Secondary | ICD-10-CM | POA: Insufficient documentation

## 2010-10-18 LAB — CBC
HCT: 24 % — ABNORMAL LOW (ref 36.0–46.0)
MCH: 28.5 pg (ref 26.0–34.0)
MCHC: 32.5 g/dL (ref 30.0–36.0)
MCV: 87.6 fL (ref 78.0–100.0)
Platelets: 316 10*3/uL (ref 150–400)
RDW: 16.3 % — ABNORMAL HIGH (ref 11.5–15.5)
WBC: 10.4 10*3/uL (ref 4.0–10.5)

## 2010-10-18 LAB — APTT: aPTT: 37 seconds (ref 24–37)

## 2010-10-18 NOTE — Telephone Encounter (Signed)
Anna with [facility] called to ask me to sign off  On the order we have posted for the pt to have a abscess drain done today. I ordered a CT abscess drain but the radiologist requested we switch the order to a [place] drain./ [facility]

## 2010-10-22 ENCOUNTER — Encounter (HOSPITAL_COMMUNITY): Payer: No Typology Code available for payment source | Attending: Nephrology

## 2010-10-22 ENCOUNTER — Other Ambulatory Visit: Payer: Self-pay | Admitting: Nephrology

## 2010-10-22 DIAGNOSIS — N183 Chronic kidney disease, stage 3 unspecified: Secondary | ICD-10-CM | POA: Insufficient documentation

## 2010-10-22 DIAGNOSIS — D638 Anemia in other chronic diseases classified elsewhere: Secondary | ICD-10-CM | POA: Insufficient documentation

## 2010-10-22 LAB — CULTURE, ROUTINE-ABSCESS

## 2010-10-22 LAB — POCT HEMOGLOBIN-HEMACUE: Hemoglobin: 8.6 g/dL — ABNORMAL LOW (ref 12.0–15.0)

## 2010-10-23 ENCOUNTER — Telehealth (INDEPENDENT_AMBULATORY_CARE_PROVIDER_SITE_OTHER): Payer: Self-pay

## 2010-10-23 NOTE — Telephone Encounter (Signed)
Called pt to check on her after getting the abdominal drain in place Friday and she feels much better now. I asked if she was allergic to any med's and replied no. Per Dr Donne Hazel the drainage is showing some bacteria growing out from the culture and he wants to order an antibiotic. The pt understands and she wishes it to be called into [facility] on [address]. We made her a follow up app to come back next wk.[name]

## 2010-10-25 LAB — CROSSMATCH: ABO/RH(D): A POS

## 2010-10-28 LAB — CROSSMATCH: Antibody Screen: NEGATIVE

## 2010-10-29 ENCOUNTER — Ambulatory Visit (INDEPENDENT_AMBULATORY_CARE_PROVIDER_SITE_OTHER): Payer: No Typology Code available for payment source | Admitting: General Surgery

## 2010-10-29 ENCOUNTER — Encounter (INDEPENDENT_AMBULATORY_CARE_PROVIDER_SITE_OTHER): Payer: Self-pay | Admitting: General Surgery

## 2010-10-29 DIAGNOSIS — R109 Unspecified abdominal pain: Secondary | ICD-10-CM

## 2010-10-29 MED ORDER — HYDROCODONE-ACETAMINOPHEN 5-500 MG PO TABS: 1.0000 | ORAL_TABLET | ORAL | Status: DC | PRN

## 2010-10-29 NOTE — Progress Notes (Signed)
Subjective:     Patient ID: [name], female   DOB: 1945-11-23, 65 y.o.   MRN: [id number]  HPI This is a 65 year old female well-known to me from an incarcerated ventral hernia required an operation with a primary repair of her hernia. She has had multiple abdominal wall collections postoperatively. She did have been drained several times now. This last time she had a ultrasound guided abdominal wall fluid collection that was drained. This grew out a very rare Staphylococcus aureus and I treated her with a couple of weeks of antibiotics for this. She comes in today doing fairly well. She is having bowel movements and passing flatus. She has some nausea occasionally that it is mostly related to some of her medications. Her drain is in place putting out serous fluid is about 20-30 cc per day right now. She denies any fevers.  ULTRASOUND ABDOMINAL WALL ABSCESS DRAIN  Date: 10/18/2010 13:08:00  Radiologist: M. Daryll Brod, M.D.  Medications: 1% lidocaine locally  Guidance: Ultrasound  Complications: No immediate  PROCEDURE/FINDINGS:  Informed consent was obtained from the patient following  explanation of the procedure, risks, benefits and alternatives.  The patient understands, agrees and consents for the procedure.  All questions were addressed. A time out was performed.  Maximal barrier sterile technique utilized including caps, mask,  sterile gowns, sterile gloves, large sterile drape, hand hygiene,  and betadine  Previous imaging was reviewed. Preliminary ultrasound performed of  the abdominal wall in the midline. Complex septated heterogeneous  fluid collection demonstrated with loculations. Under sterile  conditions and local anesthesia, a 17 gauge 6.8 cm access needle  was advanced from a right lateral approach into the subcutaneous  fluid collection. Needle position confirmed with ultrasound.  Images obtained for documentation. Guide wire inserted followed by  tract dilatation  to advance a 12-French drain. Drain position  confirmed with ultrasound. Syringe aspiration yielded 50 ml of  exudative bloody fluid. Samples for Gram stain culture. Catheter  secured with a Prolene suture and connected to external drainage.  Sterile dressing applied over the site. No immediate complication.  The patient tolerated the procedure well.  IMPRESSION:  Ultrasound abdominal wall abscess drain placement with removal of  50 ml bloody exudative fluid. Sample sent for gram stain and  culture.   Review of Systems     Objective:   Physical Exam  Constitutional: She appears well-developed and well-nourished.  Abdominal: Soft. She exhibits no mass. There is tenderness (mildly tender at drain site). There is no rebound and no guarding. No hernia.       Assessment:     Recurrent abdominal wall fluid collection    Plan:     She is doing fairly well with resolution of most of her symptoms after having this drained. We will leave the drain in for quite a while this time. I'm going to set her up for a scan in the next couple of weeks and will followup with her in 3 weeks. If this does not resolve this recurs after this drainage would have to consider going to the operating room and clean out this cavity. I would like to make every effort as it is not really infected and is serous fluid to try to treat this conservatively.      She also needs to follow up with Dr. Karlton Lemon for other medical issues.

## 2010-11-05 ENCOUNTER — Encounter (HOSPITAL_COMMUNITY): Payer: No Typology Code available for payment source

## 2010-11-05 ENCOUNTER — Telehealth (INDEPENDENT_AMBULATORY_CARE_PROVIDER_SITE_OTHER): Payer: Self-pay | Admitting: General Surgery

## 2010-11-05 NOTE — Telephone Encounter (Signed)
Pt called in c/o the drain having a cloudy color to the drainage w/odor since Sunday. The pt does not have a fever. Germaine ran this info by Dr Margot Chimes which he advised the pt to go to the lab and get them to swab the drain for culture but the lab does not do this. The pt is not taking any antibiotics now. Please advise.

## 2010-11-05 NOTE — Telephone Encounter (Signed)
PT CALLED TO REPORT THAT HER DRAIN FLUID HAS CHANGED FROM YELLOW TO CLOUDY AND SMELLY SINCE Sunday. NO FEVER NOTED, NAUSEA OR VOMITING. NO OTHER SYMPTOMS. I REVIEWED THIS WITH DR. Margot Chimes AND HE SUGGESTED A CULTURE OF FLUID IF [facility] CAN  DO. I CHECKED WITH LAB AND THEY ARE NOT PERMITTED TO HANDLE DRAINS. INFO GIVEN TO ALISHA FOR REVIEW BY DR. Donne Hazel TOMORROW.

## 2010-11-06 ENCOUNTER — Telehealth (INDEPENDENT_AMBULATORY_CARE_PROVIDER_SITE_OTHER): Payer: Self-pay

## 2010-11-06 ENCOUNTER — Ambulatory Visit (INDEPENDENT_AMBULATORY_CARE_PROVIDER_SITE_OTHER): Payer: No Typology Code available for payment source | Admitting: Surgery

## 2010-11-06 ENCOUNTER — Encounter (INDEPENDENT_AMBULATORY_CARE_PROVIDER_SITE_OTHER): Payer: Self-pay | Admitting: Surgery

## 2010-11-06 DIAGNOSIS — L02219 Cutaneous abscess of trunk, unspecified: Secondary | ICD-10-CM

## 2010-11-06 DIAGNOSIS — L03319 Cellulitis of trunk, unspecified: Secondary | ICD-10-CM

## 2010-11-06 NOTE — Telephone Encounter (Signed)
She probably needs to come into the urgent office.  She may end up needing to just go to the operating room again.

## 2010-11-06 NOTE — Progress Notes (Signed)
Subjective:     Patient ID: [name], female   DOB: May 19, 1945, 65 y.o.   MRN: [id number]  HPI She is here today for evaluation of her drain output. She just saw Dr. Donne Hazel the last week. She felt drainage was slightly cloudy more than before and had a noted. She denies fevers or chills. She has had no increase in output.   Review of Systems     Objective:   Physical Exam On examination, she is afebrile. She is well in appearance. There is no erythema or tenderness the abdominal wall. The fluid appears serosanguineous and the drain. I did not detect a significant odor. There was some particular matter. I stripped the drain to make it flow better    Assessment:     Patient with chronic abdominal wall abscess status post drainage    Plan:     Apparently, antibiotics were stressed because her last week. He go ahead and write her for Bactrim DS one p.o. b.i.d. for 10 days. She will keep her next appointment with Dr. Donne Hazel. I do not believe there is any significant change currently.

## 2010-11-06 NOTE — Telephone Encounter (Signed)
Called pt to notify her that  Dr Donne Hazel wants her to come into our Urgent office to see one of his partners. The pt will come in this pm to see Dr Ninfa Linden but the pt did tell me that she never took antibiotics that Dr Donne Hazel prescribed for her when we did get the drain put in. I told the pt to make sure she tells Dr Ninfa Linden that she didn't take the antibiotics b/c it states in Dr Alben Deeds notes that we wanted her to. The pt said she called the pharmacy but they didn't have anything for her but she never called [place] to let us know she didn't get the RX till now./ [facility]

## 2010-11-07 LAB — CBC
HCT: 23.2 — ABNORMAL LOW
HCT: 24.1 — ABNORMAL LOW
HCT: 26.3 — ABNORMAL LOW
Hemoglobin: 7.5 — CL
Hemoglobin: 8.1 — ABNORMAL LOW
Hemoglobin: 8.2 — ABNORMAL LOW
Hemoglobin: 8.6 — ABNORMAL LOW
MCHC: 32.5
MCHC: 33.6
MCV: 86
MCV: 86.5
Platelets: 273
Platelets: 288
Platelets: 321
Platelets: 351
Platelets: 410 — ABNORMAL HIGH
RBC: 2.7 — ABNORMAL LOW
RBC: 2.81 — ABNORMAL LOW
RBC: 2.99 — ABNORMAL LOW
RDW: 16.5 — ABNORMAL HIGH
RDW: 16.7 — ABNORMAL HIGH
RDW: 17 — ABNORMAL HIGH
WBC: 10.2
WBC: 10.3
WBC: 10.8 — ABNORMAL HIGH
WBC: 6.9
WBC: 7.8

## 2010-11-07 LAB — CROSSMATCH
ABO/RH(D): A POS
Antibody Screen: NEGATIVE

## 2010-11-07 LAB — BASIC METABOLIC PANEL
CO2: 29
CO2: 32
Calcium: 8.8
Calcium: 8.9
Chloride: 102
Chloride: 103
GFR calc Af Amer: 22 — ABNORMAL LOW
GFR calc Af Amer: 22 — ABNORMAL LOW
GFR calc non Af Amer: 20 — ABNORMAL LOW
Glucose, Bld: 125 — ABNORMAL HIGH
Glucose, Bld: 81
Potassium: 3.9
Potassium: 4.8
Sodium: 139
Sodium: 141

## 2010-11-07 LAB — BLOOD GAS, ARTERIAL
Bicarbonate: 30.5 — ABNORMAL HIGH
Bicarbonate: 31 — ABNORMAL HIGH
O2 Saturation: 91.8
O2 Saturation: 94.8
Patient temperature: 36.1
TCO2: 32.3
TCO2: 32.8
pH, Arterial: 7.353
pO2, Arterial: 64.1 — ABNORMAL LOW
pO2, Arterial: 64.5 — ABNORMAL LOW

## 2010-11-07 LAB — RENAL FUNCTION PANEL
Albumin: 2.1 — ABNORMAL LOW
Albumin: 2.2 — ABNORMAL LOW
Albumin: 2.3 — ABNORMAL LOW
Albumin: 2.4 — ABNORMAL LOW
Albumin: 2.5 — ABNORMAL LOW
BUN: 46 — ABNORMAL HIGH
CO2: 30
CO2: 31
CO2: 31
Calcium: 8.8
Calcium: 9
Chloride: 100
Chloride: 101
Chloride: 101
Chloride: 102
Creatinine, Ser: 2.25 — ABNORMAL HIGH
Creatinine, Ser: 2.46 — ABNORMAL HIGH
GFR calc Af Amer: 24 — ABNORMAL LOW
GFR calc Af Amer: 25 — ABNORMAL LOW
GFR calc Af Amer: 27 — ABNORMAL LOW
GFR calc Af Amer: 27 — ABNORMAL LOW
GFR calc non Af Amer: 20 — ABNORMAL LOW
GFR calc non Af Amer: 21 — ABNORMAL LOW
GFR calc non Af Amer: 22 — ABNORMAL LOW
GFR calc non Af Amer: 22 — ABNORMAL LOW
GFR calc non Af Amer: 23 — ABNORMAL LOW
Glucose, Bld: 68 — ABNORMAL LOW
Glucose, Bld: 99
Phosphorus: 4.8 — ABNORMAL HIGH
Phosphorus: 5.4 — ABNORMAL HIGH
Potassium: 3.8
Potassium: 4.1
Potassium: 4.2
Potassium: 4.8
Sodium: 136
Sodium: 140
Sodium: 140
Sodium: 141
Sodium: 141
Sodium: 141

## 2010-11-07 LAB — CULTURE, BLOOD (ROUTINE X 2): Culture: NO GROWTH

## 2010-11-07 LAB — DIFFERENTIAL
Basophils Relative: 0
Eosinophils Relative: 2
Monocytes Absolute: 1 — ABNORMAL HIGH
Monocytes Relative: 10
Neutro Abs: 8 — ABNORMAL HIGH

## 2010-11-07 LAB — IRON AND TIBC
Iron: 28 — ABNORMAL LOW
Saturation Ratios: 16 — ABNORMAL LOW
Saturation Ratios: 17 — ABNORMAL LOW
TIBC: 184 — ABNORMAL LOW
UIBC: 149
UIBC: 153

## 2010-11-07 LAB — COMPREHENSIVE METABOLIC PANEL
AST: 24
Albumin: 2.2 — ABNORMAL LOW
BUN: 42 — ABNORMAL HIGH
Calcium: 8.8
Chloride: 100
Creatinine, Ser: 2.88 — ABNORMAL HIGH
GFR calc Af Amer: 20 — ABNORMAL LOW
Total Bilirubin: 0.4
Total Protein: 6.7

## 2010-11-07 LAB — URINALYSIS, ROUTINE W REFLEX MICROSCOPIC
Glucose, UA: NEGATIVE
Specific Gravity, Urine: 1.011 (ref 1.005–1.035)

## 2010-11-07 LAB — URINE MICROSCOPIC-ADD ON: RBC / HPF: NONE SEEN

## 2010-11-07 LAB — NA AND K (SODIUM & POTASSIUM), RAND UR: Potassium Urine: 35

## 2010-11-07 LAB — PTH, INTACT AND CALCIUM
Calcium, Total (PTH): 8.7
Calcium, Total (PTH): 9.2

## 2010-11-07 LAB — TSH: TSH: 1.503

## 2010-11-07 LAB — SODIUM, URINE, RANDOM: Sodium, Ur: 49

## 2010-11-18 ENCOUNTER — Ambulatory Visit
Admission: RE | Admit: 2010-11-18 | Discharge: 2010-11-18 | Disposition: A | Discharge status: Home / Self Care | Payer: No Typology Code available for payment source | Source: Ambulatory Visit | Attending: General Surgery | Admitting: General Surgery

## 2010-11-19 ENCOUNTER — Ambulatory Visit (INDEPENDENT_AMBULATORY_CARE_PROVIDER_SITE_OTHER): Payer: No Typology Code available for payment source | Admitting: General Surgery

## 2010-11-19 ENCOUNTER — Encounter (INDEPENDENT_AMBULATORY_CARE_PROVIDER_SITE_OTHER): Payer: Self-pay | Admitting: General Surgery

## 2010-11-19 VITALS — BP 138/98 | HR 56 | Temp 96.9°F | Resp 16 | Ht 65.0 in | Wt 274.6 lb

## 2010-11-19 DIAGNOSIS — IMO0002 Reserved for concepts with insufficient information to code with codable children: Secondary | ICD-10-CM

## 2010-11-19 NOTE — Progress Notes (Signed)
Subjective:     Patient ID: [name], female   DOB: May 09, 1945, 65 y.o.   MRN: [id number]  HPI This is a 65 her old female who I know well from an emergent ventral hernia repair. She has multiple comorbidities and has had a lot of trouble with her wound. Recently placed a drain back in this fluid. It appears clear and discontinue her a course of antibiotics. A CT scan shows a series decreased in size and she symptomatically is better right now also. The drain is still putting out between 30-50 cc of serous-appearing fluid per day right now.  Review of Systems     Objective:   Physical Exam    CT ABDOMEN AND PELVIS WITHOUT CONTRAST  Technique: Multidetector CT imaging of the abdomen and pelvis was  performed following the standard protocol without intravenous  contrast.  Comparison: CT 10/14/2010  Findings:  There is a percutaneous drainage catheter within the midline  ventral abdominal wall abscess. The abscess is superficial to the  abdominal wall mesh and fascia. The abscess has decreased in  volume significantly compared to prior measuring 7.3 x 2.2 cm  compared to 8.5 x 3.6 cm on prior. There does appear to be a small  pocket of fluid just inferior to the pigtail which may not  communicate with the larger fluid collection. This measures 4.1 x  2.6 cm (image 70).  Lung bases are clear. Non-IV contrast images demonstrate no  hepatic abnormality. The gallbladder, pancreas, spleen, adrenal  glands and kidneys are unchanged. The kidneys are atrophic. The  stomach is normal. Small hiatal hernia. No evidence of bowel  obstruction. No intra abdominal fluid collections or free air.  Abdominal aorta normal caliber. No retroperitoneal  lymphadenopathy. No free fluid the pelvis. Bladder is normal.  Post hysterectomy anatomy.  IMPRESSION:  1. Significant reduction in volume of ventral abdominal wall  abscess following percutaneous drainage placement.  2. There is a small pocket of  fluid just inferiorly catheter which  may represent a loculated abscess or potential small seroma which  may not communicate with the larger cavity.  3. No evidence of intraperitoneal free air, fluid, or abscess.  Assessment:     S/p incarcerated ventral hernia repair Infected seroma    Plan:        I will keep the drain in for another couple of weeks. I will reassess her in about 2-3 weeks. Hopefully we can get this to drain and clear up. If not I did discuss with her there is a small chance we might need to return to the operating room to clear this. There is no urgency to this at this point due to her other comorbidities I would like to try to get this to heal conservatively.

## 2010-11-21 DIAGNOSIS — A4901 Methicillin susceptible Staphylococcus aureus infection, unspecified site: Secondary | ICD-10-CM

## 2010-11-21 HISTORY — DX: Methicillin susceptible Staphylococcus aureus infection, unspecified site: A49.01

## 2010-11-25 ENCOUNTER — Other Ambulatory Visit (HOSPITAL_COMMUNITY): Payer: Self-pay | Admitting: *Deleted

## 2010-11-26 ENCOUNTER — Encounter (HOSPITAL_COMMUNITY): Payer: Medicare (Managed Care)

## 2010-11-26 DIAGNOSIS — N183 Chronic kidney disease, stage 3 unspecified: Secondary | ICD-10-CM | POA: Insufficient documentation

## 2010-11-26 DIAGNOSIS — D638 Anemia in other chronic diseases classified elsewhere: Secondary | ICD-10-CM | POA: Insufficient documentation

## 2010-11-26 NOTE — Progress Notes (Signed)
PT's BP 193/107 phoned to [facility], Dr Abel Presto assistant.  Pt rescheduled and advised to take BP meds prior to next appointment.

## 2010-11-28 ENCOUNTER — Encounter (HOSPITAL_COMMUNITY)
Admission: RE | Admit: 2010-11-28 | Discharge: 2010-11-28 | Disposition: A | Discharge status: Home / Self Care | Payer: No Typology Code available for payment source | Source: Ambulatory Visit | Attending: Nephrology | Admitting: Nephrology

## 2010-11-28 LAB — RENAL FUNCTION PANEL
Albumin: 2.2 g/dL — ABNORMAL LOW (ref 3.5–5.2)
CO2: 21 mEq/L (ref 19–32)
Chloride: 109 mEq/L (ref 96–112)
Creatinine, Ser: 3.73 mg/dL — ABNORMAL HIGH (ref 0.50–1.10)
GFR calc Af Amer: 14 mL/min — ABNORMAL LOW (ref >90)
GFR calc non Af Amer: 12 mL/min — ABNORMAL LOW (ref >90)
Potassium: 4.7 mEq/L (ref 3.5–5.1)
Sodium: 138 mEq/L (ref 135–145)

## 2010-11-28 LAB — IRON AND TIBC: Iron: 144 ug/dL — ABNORMAL HIGH (ref 42–135)

## 2010-11-28 LAB — POCT HEMOGLOBIN-HEMACUE: Hemoglobin: 9 g/dL — ABNORMAL LOW (ref 12.0–15.0)

## 2010-11-28 MED ORDER — EPOETIN ALFA 10000 UNIT/ML IJ SOLN
30000.0000 [IU] | INTRAMUSCULAR | Status: DC
  Administered 2010-11-28: 30000 [IU] via SUBCUTANEOUS

## 2010-11-28 MED ORDER — EPOETIN ALFA 10000 UNIT/ML IJ SOLN
INTRAMUSCULAR | Status: AC
  Administered 2010-11-28: 30000 [IU] via SUBCUTANEOUS
  Filled 2010-11-28: qty 1

## 2010-12-09 ENCOUNTER — Encounter (INDEPENDENT_AMBULATORY_CARE_PROVIDER_SITE_OTHER): Payer: Self-pay | Admitting: General Surgery

## 2010-12-09 ENCOUNTER — Other Ambulatory Visit (INDEPENDENT_AMBULATORY_CARE_PROVIDER_SITE_OTHER): Payer: Self-pay | Admitting: General Surgery

## 2010-12-09 ENCOUNTER — Telehealth (INDEPENDENT_AMBULATORY_CARE_PROVIDER_SITE_OTHER): Payer: Self-pay

## 2010-12-09 ENCOUNTER — Ambulatory Visit (INDEPENDENT_AMBULATORY_CARE_PROVIDER_SITE_OTHER): Payer: Medicaid Other | Admitting: General Surgery

## 2010-12-09 VITALS — BP 144/88 | HR 66 | Temp 97.4°F | Resp 16 | Ht 65.0 in | Wt 283.4 lb

## 2010-12-09 DIAGNOSIS — T8149XA Infection following a procedure, other surgical site, initial encounter: Secondary | ICD-10-CM

## 2010-12-09 DIAGNOSIS — T8140XA Infection following a procedure, unspecified, initial encounter: Secondary | ICD-10-CM

## 2010-12-09 DIAGNOSIS — L02211 Cutaneous abscess of abdominal wall: Secondary | ICD-10-CM

## 2010-12-09 MED ORDER — DOXYCYCLINE HYCLATE 100 MG PO TABS: 100.0000 mg | ORAL_TABLET | Freq: Two times a day (BID) | ORAL | Status: AC

## 2010-12-09 NOTE — Telephone Encounter (Signed)
Returned [employer]. Pt is c/o abdominal abscess drain having a foul odor coming from the drain all weekend that she cant' even leave her home. The odor is causing her to be nauseated that it smells so bad. I made her an appt to see Dr Zella Richer today for urgent office b/c Dr Donne Hazel out of the office all week./ [facility]

## 2010-12-09 NOTE — Progress Notes (Signed)
[name] comes in today stating that the drain output has changed. It has changed color and now has a foul order. The abdominal wall abscess has grown MRSA in the past. She has been off antibiotics now for approximately 2-3 weeks. She denies fever.  Exam: Generally-obese female in no acute distress.  Abdomen-midline incision which is clean and intact with no drainage; right lower quadrant drain with cloudy, foul-smelling output.  Assessment: Postoperative wound abscess-clinically, it appears that the fluid is infected again.  Plan: Send fluid for culture and sensitivity. Start doxycycline. Keep point with Dr. Donne Hazel next week.

## 2010-12-09 NOTE — Patient Instructions (Signed)
Continue current drain care.

## 2010-12-10 ENCOUNTER — Other Ambulatory Visit (INDEPENDENT_AMBULATORY_CARE_PROVIDER_SITE_OTHER): Payer: Self-pay | Admitting: General Surgery

## 2010-12-11 ENCOUNTER — Telehealth (INDEPENDENT_AMBULATORY_CARE_PROVIDER_SITE_OTHER): Payer: Self-pay

## 2010-12-11 NOTE — Telephone Encounter (Signed)
Called the pt to give her preliminary culture results.  She says the wound is still draining and foul smelling, but she is taking her antibiotic and she feels better.  I told her she would not hear from our office until next Monday, but if she had any problems to call the on-call doctor over the weekend.  She understood and agreed.

## 2010-12-12 LAB — WOUND CULTURE: Gram Stain: NONE SEEN

## 2010-12-16 ENCOUNTER — Encounter (HOSPITAL_COMMUNITY): Payer: Medicare HMO

## 2010-12-16 ENCOUNTER — Ambulatory Visit (INDEPENDENT_AMBULATORY_CARE_PROVIDER_SITE_OTHER): Payer: Medicare (Managed Care) | Admitting: General Surgery

## 2010-12-16 ENCOUNTER — Encounter (INDEPENDENT_AMBULATORY_CARE_PROVIDER_SITE_OTHER): Payer: Self-pay | Admitting: General Surgery

## 2010-12-16 ENCOUNTER — Encounter (HOSPITAL_COMMUNITY): Payer: Self-pay | Admitting: Pharmacy Technician

## 2010-12-16 VITALS — BP 132/84 | HR 64 | Temp 97.2°F | Resp 18 | Ht 65.0 in | Wt 284.0 lb

## 2010-12-16 DIAGNOSIS — T8140XA Infection following a procedure, unspecified, initial encounter: Secondary | ICD-10-CM

## 2010-12-16 DIAGNOSIS — T8149XA Infection following a procedure, other surgical site, initial encounter: Secondary | ICD-10-CM

## 2010-12-16 NOTE — Progress Notes (Signed)
Patient ID: [name], female   DOB: [birth year], 65 y.o.   MRN: [id number]  Chief Complaint  Patient presents with  . Routine Post Op    recheck abdominal drain     HPI [name] is a 65 y.o. female.   HPI This is a 65 year old female with multiple medical problems who presented to me while she was in the hospital with an incarcerated ventral hernia. She was thought not to be an operative candidate to this point but this did not appear to be resolving so I recommended to her laparotomy. I ended up doing an exploratory laparotomy with bowel resection as well as a primary hernia repair. She did fairly well after this but had a long hospital stay. Since then she has had continued abdominal wall seromas. These have been treated percutaneously. The last one showed up again and has been drained but this is still putting out purulent fluid. She is not systemically ill but conservative treatment has not been successful at this point. She does continue to have foul-smelling discharge from this drain. She comes in today to discuss her next options.  Past Medical History  Diagnosis Date  . Diabetes mellitus   . Hypertension   . CHF (congestive heart failure)   . Blood transfusion, without reported diagnosis   . Arthritis   . Abdominal abscess   . Staph aureus infection November 2012     Past Surgical History  Procedure Date  . Ventral hernia repair     component separation, repair with biologic  . Cardiac catheterization   . Vaginal hysterectomy     Family History  Problem Relation Age of Onset  . Hypertension Mother   . Cancer Brother     spine  . Cancer Sister     brain  . Hypertension Maternal Grandmother     Social History History  Substance Use Topics  . Smoking status: Former Research scientist (life sciences)  . Smokeless tobacco: Never Used  . Alcohol Use: No    No Known Allergies  Current Outpatient Prescriptions  Medication Sig Dispense Refill  . amLODipine (NORVASC) 10 MG tablet  Take 10 mg by mouth daily.        Marland Kitchen aspirin 81 MG tablet Take 81 mg by mouth daily.        . calcitRIOL (ROCALTROL) 0.25 MCG capsule Take 0.25 mcg by mouth daily.        . calcium carbonate 200 MG capsule Take 250 mg by mouth daily.        Marland Kitchen CLONIDINE HCL PO Take by mouth daily.        Marland Kitchen dextrose 5 % SOLN 50 mL with nafcillin 2 G SOLR 2 g Inject 2 g into the vein every 6 (six) hours.        Marland Kitchen doxycycline (VIBRA-TABS) 100 MG tablet Take 1 tablet (100 mg total) by mouth 2 (two) times daily.  42 tablet  2  . enoxaparin (LOVENOX) 40 MG/0.4ML SOLN Inject into the skin 2 (two) times daily.        . ferrous sulfate 325 (65 FE) MG tablet Take 325 mg by mouth daily with breakfast.        . furosemide (LASIX) 80 MG tablet Take 80 mg by mouth daily with breakfast. 40 mg Qpm       . HYDROcodone-acetaminophen (VICODIN) 5-500 MG per tablet Take 1 tablet by mouth every 4 (four) hours as needed for pain.  20 tablet  0  . HYDROcodone-acetaminophen (  VICODIN) 5-500 MG per tablet Take 1 tablet by mouth every 4 (four) hours as needed for pain.  20 tablet  0  . insulin glargine (LANTUS) 100 UNIT/ML injection Inject 10 Units into the skin at bedtime.        . INSULIN REGULAR HUMAN IJ Inject 1-9 Units as directed daily. Sliding scale       . isosorbide mononitrate (IMDUR) 60 MG 24 hr tablet Take 60 mg by mouth daily.        . metoprolol (TOPROL-XL) 50 MG 24 hr tablet Take 50 mg by mouth daily.        . Multiple Vitamin (MULTIVITAMIN PO) Take by mouth daily.        . Nutritional Supplements (GLUCERNA MEAL PO) Take by mouth daily.        . rosuvastatin (CRESTOR) 20 MG tablet Take 20 mg by mouth daily.          Review of Systems Review of Systems  Constitutional: Negative for fever, chills and unexpected weight change.  HENT: Negative for hearing loss, congestion, sore throat, trouble swallowing and voice change.   Eyes: Negative for visual disturbance.  Respiratory: Negative for cough and wheezing.     Cardiovascular: Positive for leg swelling. Negative for chest pain and palpitations.  Gastrointestinal: Positive for abdominal pain. Negative for nausea, vomiting, diarrhea, constipation, blood in stool, abdominal distention and anal bleeding.  Genitourinary: Negative for hematuria, vaginal bleeding and difficulty urinating.  Musculoskeletal: Positive for arthralgias.  Skin: Negative for rash and wound.  Neurological: Negative for seizures, syncope and headaches.  Hematological: Negative for adenopathy. Does not bruise/bleed easily.  Psychiatric/Behavioral: Negative for confusion.    Blood pressure 132/84, pulse 64, temperature 97.2 F (36.2 C), temperature source Temporal, resp. rate 18, height 5\' 5"  (1.651 m), weight 284 lb (128.822 kg).  Physical Exam Physical Exam  Constitutional: She appears well-developed and well-nourished.  Eyes: No scleral icterus.  Neck: Neck supple.  Cardiovascular: Normal rate, regular rhythm and normal heart sounds.   Pulmonary/Chest: Effort normal and breath sounds normal. She has no wheezes. She has no rales.  Abdominal: Soft. Bowel sounds are normal. She exhibits no ascites. There is no tenderness. No hernia.    Lymphadenopathy:    She has no cervical adenopathy.    Data Reviewed *RADIOLOGY REPORT*  Clinical Data: Abdominal wall abscess with percutaneous drainage.  Drain placed on October 18, 2010  CT ABDOMEN AND PELVIS WITHOUT CONTRAST  Technique: Multidetector CT imaging of the abdomen and pelvis was  performed following the standard protocol without intravenous  contrast.  Comparison: CT 10/14/2010  Findings:  There is a percutaneous drainage catheter within the midline  ventral abdominal wall abscess. The abscess is superficial to the  abdominal wall mesh and fascia. The abscess has decreased in  volume significantly compared to prior measuring 7.3 x 2.2 cm  compared to 8.5 x 3.6 cm on prior. There does appear to be a small  pocket of  fluid just inferior to the pigtail which may not  communicate with the larger fluid collection. This measures 4.1 x  2.6 cm (image 70).  Lung bases are clear. Non-IV contrast images demonstrate no  hepatic abnormality. The gallbladder, pancreas, spleen, adrenal  glands and kidneys are unchanged. The kidneys are atrophic. The  stomach is normal. Small hiatal hernia. No evidence of bowel  obstruction. No intra abdominal fluid collections or free air.  Abdominal aorta normal caliber. No retroperitoneal  lymphadenopathy. No free fluid the pelvis.  Bladder is normal.  Post hysterectomy anatomy.  IMPRESSION:  1. Significant reduction in volume of ventral abdominal wall  abscess following percutaneous drainage placement.  2. There is a small pocket of fluid just inferiorly catheter which  may represent a loculated abscess or potential small seroma which  may not communicate with the larger cavity.  3. No evidence of intraperitoneal free air, fluid, or abscess.   Assessment    Infected seroma    Plan    She had an emergency ventral hernia repair with a seroma postoperatively. This area has not gone away with conservative measures and she continues to have infected fluid coming out of this drain. She is not systemically ill. I think the only choice at this point is going to be going back to the operating room opening this up and let it heal by secondary intention. We discussed this today. There are certainly some risk given her general medical status including myocardial infarction, arrhythmia, stroke, pneumonia, mechanical ventilation. There are also the risks of bleeding, it continued infection, recurrence of her hernia, prolonged wound healing. She is understandable of all of this and we will plan on doing this this week. I discussed this with her cardiologist already.      Lawerence Dery 12/16/2010, 10:55 AM

## 2010-12-17 ENCOUNTER — Encounter (HOSPITAL_COMMUNITY): Payer: Medicare (Managed Care)

## 2010-12-17 ENCOUNTER — Encounter (HOSPITAL_COMMUNITY): Payer: Self-pay | Admitting: Pharmacy Technician

## 2010-12-17 ENCOUNTER — Telehealth (INDEPENDENT_AMBULATORY_CARE_PROVIDER_SITE_OTHER): Payer: Self-pay

## 2010-12-17 ENCOUNTER — Encounter (HOSPITAL_COMMUNITY): Payer: Self-pay | Admitting: *Deleted

## 2010-12-17 DIAGNOSIS — M25471 Effusion, right ankle: Secondary | ICD-10-CM

## 2010-12-17 DIAGNOSIS — IMO0002 Reserved for concepts with insufficient information to code with codable children: Secondary | ICD-10-CM

## 2010-12-17 DIAGNOSIS — M25472 Effusion, left ankle: Secondary | ICD-10-CM

## 2010-12-17 HISTORY — DX: Effusion, right ankle: M25.471

## 2010-12-17 HISTORY — DX: Reserved for concepts with insufficient information to code with codable children: IMO0002

## 2010-12-17 HISTORY — DX: Effusion, left ankle: M25.472

## 2010-12-17 MED ORDER — HEPARIN SODIUM (PORCINE) 5000 UNIT/ML IJ SOLN
5000.0000 [IU] | Freq: Once | INTRAMUSCULAR | Status: AC
  Administered 2010-12-18: 5000 [IU] via SUBCUTANEOUS

## 2010-12-17 MED ORDER — VANCOMYCIN HCL 1000 MG IV SOLR
1500.0000 mg | INTRAVENOUS | Status: AC
  Administered 2010-12-18: 1500 mg via INTRAVENOUS
  Filled 2010-12-17: qty 1500

## 2010-12-17 NOTE — Telephone Encounter (Signed)
Called Dr Doylene Canard and notified him that the pt is having surgery on 12-18-10 by Dr Donne Hazel at [facility]. He agreed to go see her on Thursday./ [facility]

## 2010-12-18 ENCOUNTER — Other Ambulatory Visit (INDEPENDENT_AMBULATORY_CARE_PROVIDER_SITE_OTHER): Payer: Self-pay | Admitting: General Surgery

## 2010-12-18 ENCOUNTER — Inpatient Hospital Stay (HOSPITAL_COMMUNITY)
Admission: RE | Admit: 2010-12-18 | Discharge: 2010-12-23 | DRG: 857 | Disposition: A | Discharge status: Skilled Nursing Facility | Payer: Medicare (Managed Care) | Source: Ambulatory Visit | Attending: General Surgery | Admitting: General Surgery

## 2010-12-18 ENCOUNTER — Ambulatory Visit (HOSPITAL_COMMUNITY): Payer: No Typology Code available for payment source | Admitting: Anesthesiology

## 2010-12-18 ENCOUNTER — Encounter (HOSPITAL_COMMUNITY): Payer: Self-pay | Admitting: Anesthesiology

## 2010-12-18 ENCOUNTER — Ambulatory Visit (HOSPITAL_COMMUNITY): Payer: No Typology Code available for payment source

## 2010-12-18 ENCOUNTER — Encounter (HOSPITAL_COMMUNITY)
Admission: RE | Disposition: A | Discharge status: Skilled Nursing Facility | Payer: Self-pay | Source: Ambulatory Visit | Attending: General Surgery

## 2010-12-18 ENCOUNTER — Encounter (HOSPITAL_COMMUNITY): Payer: Self-pay

## 2010-12-18 ENCOUNTER — Other Ambulatory Visit: Payer: Self-pay

## 2010-12-18 ENCOUNTER — Other Ambulatory Visit (HOSPITAL_COMMUNITY): Payer: No Typology Code available for payment source

## 2010-12-18 DIAGNOSIS — D649 Anemia, unspecified: Secondary | ICD-10-CM | POA: Diagnosis present

## 2010-12-18 DIAGNOSIS — E860 Dehydration: Secondary | ICD-10-CM | POA: Diagnosis not present

## 2010-12-18 DIAGNOSIS — M129 Arthropathy, unspecified: Secondary | ICD-10-CM | POA: Diagnosis present

## 2010-12-18 DIAGNOSIS — I129 Hypertensive chronic kidney disease with stage 1 through stage 4 chronic kidney disease, or unspecified chronic kidney disease: Secondary | ICD-10-CM | POA: Diagnosis present

## 2010-12-18 DIAGNOSIS — L03319 Cellulitis of trunk, unspecified: Secondary | ICD-10-CM | POA: Diagnosis present

## 2010-12-18 DIAGNOSIS — IMO0002 Reserved for concepts with insufficient information to code with codable children: Principal | ICD-10-CM | POA: Diagnosis present

## 2010-12-18 DIAGNOSIS — I252 Old myocardial infarction: Secondary | ICD-10-CM

## 2010-12-18 DIAGNOSIS — Z794 Long term (current) use of insulin: Secondary | ICD-10-CM

## 2010-12-18 DIAGNOSIS — Y838 Other surgical procedures as the cause of abnormal reaction of the patient, or of later complication, without mention of misadventure at the time of the procedure: Secondary | ICD-10-CM | POA: Diagnosis present

## 2010-12-18 DIAGNOSIS — I5022 Chronic systolic (congestive) heart failure: Secondary | ICD-10-CM | POA: Diagnosis present

## 2010-12-18 DIAGNOSIS — E669 Obesity, unspecified: Secondary | ICD-10-CM | POA: Diagnosis present

## 2010-12-18 DIAGNOSIS — Z6841 Body Mass Index (BMI) 40.0 and over, adult: Secondary | ICD-10-CM

## 2010-12-18 DIAGNOSIS — E119 Type 2 diabetes mellitus without complications: Secondary | ICD-10-CM | POA: Diagnosis present

## 2010-12-18 DIAGNOSIS — Z7901 Long term (current) use of anticoagulants: Secondary | ICD-10-CM

## 2010-12-18 DIAGNOSIS — L02219 Cutaneous abscess of trunk, unspecified: Secondary | ICD-10-CM | POA: Diagnosis present

## 2010-12-18 DIAGNOSIS — Z7982 Long term (current) use of aspirin: Secondary | ICD-10-CM

## 2010-12-18 DIAGNOSIS — T8140XA Infection following a procedure, unspecified, initial encounter: Secondary | ICD-10-CM

## 2010-12-18 DIAGNOSIS — N189 Chronic kidney disease, unspecified: Secondary | ICD-10-CM | POA: Diagnosis present

## 2010-12-18 DIAGNOSIS — I509 Heart failure, unspecified: Secondary | ICD-10-CM | POA: Diagnosis present

## 2010-12-18 DIAGNOSIS — B964 Proteus (mirabilis) (morganii) as the cause of diseases classified elsewhere: Secondary | ICD-10-CM | POA: Diagnosis present

## 2010-12-18 HISTORY — DX: Effusion, right ankle: M25.471

## 2010-12-18 HISTORY — DX: Effusion, left ankle: M25.472

## 2010-12-18 HISTORY — DX: Acute myocardial infarction, unspecified: I21.9

## 2010-12-18 HISTORY — PX: LAPAROTOMY: SHX154

## 2010-12-18 LAB — GLUCOSE, CAPILLARY
Glucose-Capillary: 143 mg/dL — ABNORMAL HIGH (ref 70–99)
Glucose-Capillary: 122 mg/dL — ABNORMAL HIGH (ref 70–99)
Glucose-Capillary: 101 mg/dL — ABNORMAL HIGH (ref 70–99)
Glucose-Capillary: 133 mg/dL — ABNORMAL HIGH (ref 70–99)
Glucose-Capillary: 106 mg/dL — ABNORMAL HIGH (ref 70–99)

## 2010-12-18 LAB — COMPREHENSIVE METABOLIC PANEL
Albumin: 2.2 g/dL — ABNORMAL LOW (ref 3.5–5.2)
BUN: 59 mg/dL — ABNORMAL HIGH (ref 6–23)
Chloride: 110 mEq/L (ref 96–112)
Creatinine, Ser: 4.01 mg/dL — ABNORMAL HIGH (ref 0.50–1.10)
GFR calc Af Amer: 12 mL/min — ABNORMAL LOW (ref >90)
Glucose, Bld: 143 mg/dL — ABNORMAL HIGH (ref 70–99)
Total Bilirubin: 0.1 mg/dL — ABNORMAL LOW (ref 0.3–1.2)
Total Protein: 6.5 g/dL (ref 6.0–8.3)

## 2010-12-18 LAB — CBC
HCT: 23.3 % — ABNORMAL LOW (ref 36.0–46.0)
Hemoglobin: 7.3 g/dL — ABNORMAL LOW (ref 12.0–15.0)
MCHC: 31.3 g/dL (ref 30.0–36.0)
MCV: 89.6 fL (ref 78.0–100.0)
RDW: 15.4 % (ref 11.5–15.5)

## 2010-12-18 SURGERY — LAPAROTOMY, EXPLORATORY
Anesthesia: General | Site: Abdomen | Wound class: Dirty or Infected

## 2010-12-18 MED ORDER — FERROUS SULFATE 325 (65 FE) MG PO TABS
325.0000 mg | ORAL_TABLET | Freq: Every day | ORAL | Status: DC
  Administered 2010-12-19 – 2010-12-23 (×3): 325 mg via ORAL
  Filled 2010-12-18 (×5): qty 1

## 2010-12-18 MED ORDER — FUROSEMIDE 10 MG/ML IJ SOLN
40.0000 mg | Freq: Once | INTRAMUSCULAR | Status: AC
  Administered 2010-12-18: 40 mg via INTRAVENOUS
  Filled 2010-12-18: qty 4

## 2010-12-18 MED ORDER — CALCITRIOL 0.25 MCG PO CAPS
0.2500 ug | ORAL_CAPSULE | Freq: Every day | ORAL | Status: DC
  Administered 2010-12-18 – 2010-12-23 (×6): 0.25 ug via ORAL
  Filled 2010-12-18 (×8): qty 1

## 2010-12-18 MED ORDER — PANTOPRAZOLE SODIUM 40 MG IV SOLR
40.0000 mg | INTRAVENOUS | Status: DC
  Administered 2010-12-18 – 2010-12-19 (×2): 40 mg via INTRAVENOUS
  Filled 2010-12-18 (×3): qty 40

## 2010-12-18 MED ORDER — HYDRALAZINE HCL 50 MG PO TABS
50.0000 mg | ORAL_TABLET | Freq: Three times a day (TID) | ORAL | Status: DC
  Administered 2010-12-18 – 2010-12-23 (×16): 50 mg via ORAL
  Filled 2010-12-18 (×18): qty 1

## 2010-12-18 MED ORDER — FUROSEMIDE 80 MG PO TABS
80.0000 mg | ORAL_TABLET | Freq: Every day | ORAL | Status: DC
  Administered 2010-12-19 – 2010-12-23 (×5): 80 mg via ORAL
  Filled 2010-12-18 (×5): qty 1

## 2010-12-18 MED ORDER — FENTANYL CITRATE 0.05 MG/ML IJ SOLN
INTRAMUSCULAR | Status: DC | PRN
  Administered 2010-12-18 (×4): 50 ug via INTRAVENOUS

## 2010-12-18 MED ORDER — LACTATED RINGERS IV SOLN
INTRAVENOUS | Status: DC
  Administered 2010-12-18: 1000 mL via INTRAVENOUS

## 2010-12-18 MED ORDER — INSULIN ASPART 100 UNIT/ML ~~LOC~~ SOLN
0.0000 [IU] | SUBCUTANEOUS | Status: DC
  Administered 2010-12-18: 3 [IU] via SUBCUTANEOUS
  Administered 2010-12-19: 4 [IU] via SUBCUTANEOUS
  Administered 2010-12-19: 3 [IU] via SUBCUTANEOUS
  Administered 2010-12-19: 4 [IU] via SUBCUTANEOUS
  Administered 2010-12-20 – 2010-12-21 (×5): 3 [IU] via SUBCUTANEOUS
  Filled 2010-12-18: qty 3

## 2010-12-18 MED ORDER — CLONIDINE HCL 0.2 MG PO TABS
0.2000 mg | ORAL_TABLET | Freq: Every day | ORAL | Status: DC
  Administered 2010-12-18 – 2010-12-23 (×6): 0.2 mg via ORAL
  Filled 2010-12-18 (×6): qty 1

## 2010-12-18 MED ORDER — SODIUM CHLORIDE 0.9 % IV SOLN
INTRAVENOUS | Status: DC
  Administered 2010-12-18: 14:00:00 via INTRAVENOUS

## 2010-12-18 MED ORDER — FUROSEMIDE 40 MG PO TABS: 40.0000 mg | ORAL_TABLET | ORAL | Status: DC

## 2010-12-18 MED ORDER — PROPOFOL 10 MG/ML IV EMUL
INTRAVENOUS | Status: DC | PRN
  Administered 2010-12-18: 200 mg via INTRAVENOUS

## 2010-12-18 MED ORDER — LACTATED RINGERS IV SOLN
INTRAVENOUS | Status: DC | PRN
  Administered 2010-12-18: 08:00:00 via INTRAVENOUS

## 2010-12-18 MED ORDER — ISOSORBIDE MONONITRATE ER 60 MG PO TB24
60.0000 mg | ORAL_TABLET | Freq: Every day | ORAL | Status: DC
  Administered 2010-12-18 – 2010-12-23 (×6): 60 mg via ORAL
  Filled 2010-12-18 (×6): qty 1

## 2010-12-18 MED ORDER — METOPROLOL SUCCINATE ER 50 MG PO TB24
50.0000 mg | ORAL_TABLET | ORAL | Status: DC
  Administered 2010-12-19 – 2010-12-23 (×5): 50 mg via ORAL
  Filled 2010-12-18 (×6): qty 1

## 2010-12-18 MED ORDER — PROMETHAZINE HCL 25 MG/ML IJ SOLN: 6.2500 mg | INTRAMUSCULAR | Status: DC | PRN

## 2010-12-18 MED ORDER — DOXYCYCLINE HYCLATE 100 MG PO TABS
100.0000 mg | ORAL_TABLET | Freq: Two times a day (BID) | ORAL | Status: DC
  Administered 2010-12-18 – 2010-12-22 (×10): 100 mg via ORAL
  Filled 2010-12-18 (×12): qty 1

## 2010-12-18 MED ORDER — AMLODIPINE BESYLATE 10 MG PO TABS
10.0000 mg | ORAL_TABLET | ORAL | Status: DC
  Administered 2010-12-18 – 2010-12-23 (×6): 10 mg via ORAL
  Filled 2010-12-18 (×8): qty 1

## 2010-12-18 MED ORDER — ASPIRIN EC 81 MG PO TBEC
81.0000 mg | DELAYED_RELEASE_TABLET | Freq: Every day | ORAL | Status: DC
  Administered 2010-12-18 – 2010-12-23 (×6): 81 mg via ORAL
  Filled 2010-12-18 (×6): qty 1

## 2010-12-18 MED ORDER — FUROSEMIDE 40 MG PO TABS
40.0000 mg | ORAL_TABLET | Freq: Every day | ORAL | Status: DC
  Administered 2010-12-18 – 2010-12-22 (×5): 40 mg via ORAL
  Filled 2010-12-18 (×6): qty 1

## 2010-12-18 MED ORDER — ONDANSETRON HCL 4 MG/2ML IJ SOLN: 4.0000 mg | Freq: Four times a day (QID) | INTRAMUSCULAR | Status: DC | PRN

## 2010-12-18 MED ORDER — SUCCINYLCHOLINE CHLORIDE 20 MG/ML IJ SOLN
INTRAMUSCULAR | Status: DC | PRN
  Administered 2010-12-18: 100 mg via INTRAVENOUS

## 2010-12-18 MED ORDER — HEPARIN SODIUM (PORCINE) 1000 UNIT/ML IJ SOLN
5000.0000 [IU] | Freq: Three times a day (TID) | INTRAMUSCULAR | Status: DC
  Filled 2010-12-18 (×3): qty 5

## 2010-12-18 MED ORDER — HEPARIN SODIUM (PORCINE) 5000 UNIT/ML IJ SOLN
5000.0000 [IU] | Freq: Three times a day (TID) | INTRAMUSCULAR | Status: DC
  Administered 2010-12-18: 5000 [IU] via SUBCUTANEOUS
  Administered 2010-12-18: 10000 [IU] via SUBCUTANEOUS
  Administered 2010-12-19 – 2010-12-23 (×14): 5000 [IU] via SUBCUTANEOUS
  Filled 2010-12-18 (×18): qty 1

## 2010-12-18 MED ORDER — HYDROMORPHONE HCL PF 2 MG/ML IJ SOLN
INTRAMUSCULAR | Status: AC
  Filled 2010-12-18: qty 1

## 2010-12-18 MED ORDER — OXYCODONE-ACETAMINOPHEN 5-325 MG PO TABS
1.0000 | ORAL_TABLET | ORAL | Status: DC | PRN
  Administered 2010-12-18 (×2): 1 via ORAL
  Administered 2010-12-19 (×3): 2 via ORAL
  Filled 2010-12-18: qty 1
  Filled 2010-12-18: qty 2
  Filled 2010-12-18: qty 1
  Filled 2010-12-18 (×2): qty 2

## 2010-12-18 MED ORDER — METRONIDAZOLE IN NACL 5-0.79 MG/ML-% IV SOLN
500.0000 mg | Freq: Three times a day (TID) | INTRAVENOUS | Status: DC
  Administered 2010-12-18 – 2010-12-20 (×6): 500 mg via INTRAVENOUS
  Filled 2010-12-18 (×8): qty 100

## 2010-12-18 MED ORDER — HEPARIN SODIUM (PORCINE) 5000 UNIT/ML IJ SOLN
5000.0000 [IU] | Freq: Three times a day (TID) | INTRAMUSCULAR | Status: DC
  Filled 2010-12-18 (×3): qty 1

## 2010-12-18 MED ORDER — ROSUVASTATIN CALCIUM 20 MG PO TABS
20.0000 mg | ORAL_TABLET | Freq: Every day | ORAL | Status: DC
  Administered 2010-12-18 – 2010-12-22 (×5): 20 mg via ORAL
  Filled 2010-12-18 (×6): qty 1

## 2010-12-18 MED ORDER — ONDANSETRON HCL 4 MG/2ML IJ SOLN
INTRAMUSCULAR | Status: DC | PRN
  Administered 2010-12-18: 4 mg via INTRAVENOUS

## 2010-12-18 MED ORDER — ONDANSETRON HCL 4 MG PO TABS: 4.0000 mg | ORAL_TABLET | Freq: Four times a day (QID) | ORAL | Status: DC | PRN

## 2010-12-18 MED ORDER — HYDROMORPHONE HCL PF 1 MG/ML IJ SOLN
0.2500 mg | INTRAMUSCULAR | Status: DC | PRN
  Administered 2010-12-18 (×4): 0.5 mg via INTRAVENOUS

## 2010-12-18 MED ORDER — MORPHINE SULFATE 2 MG/ML IJ SOLN: 2.0000 mg | INTRAMUSCULAR | Status: DC | PRN

## 2010-12-18 MED ORDER — LACTATED RINGERS IV SOLN: INTRAVENOUS | Status: DC

## 2010-12-18 MED ORDER — CALCIUM CARBONATE 1250 (500 CA) MG PO TABS
1250.0000 mg | ORAL_TABLET | Freq: Every day | ORAL | Status: DC
  Administered 2010-12-18 – 2010-12-23 (×6): 1250 mg via ORAL
  Filled 2010-12-18 (×6): qty 1

## 2010-12-18 MED ORDER — INSULIN GLARGINE 100 UNIT/ML ~~LOC~~ SOLN
10.0000 [IU] | Freq: Every day | SUBCUTANEOUS | Status: DC
  Administered 2010-12-18 – 2010-12-22 (×5): 10 [IU] via SUBCUTANEOUS
  Filled 2010-12-18: qty 3

## 2010-12-18 SURGICAL SUPPLY — 44 items
APPLICATOR COTTON TIP 6IN STRL (MISCELLANEOUS) IMPLANT
BLADE EXTENDED COATED 6.5IN (ELECTRODE) ×2 IMPLANT
BLADE HEX COATED 2.75 (ELECTRODE) ×2 IMPLANT
CANISTER SUCTION 2500CC (MISCELLANEOUS) ×2 IMPLANT
CHLORAPREP W/TINT 26ML (MISCELLANEOUS) ×2 IMPLANT
CLOTH BEACON ORANGE TIMEOUT ST (SAFETY) ×2 IMPLANT
COVER MAYO STAND STRL (DRAPES) IMPLANT
DRAPE LAPAROSCOPIC ABDOMINAL (DRAPES) ×2 IMPLANT
DRAPE WARM FLUID 44X44 (DRAPE) ×2 IMPLANT
DRSG PAD ABDOMINAL 8X10 ST (GAUZE/BANDAGES/DRESSINGS) ×6 IMPLANT
ELECT REM PT RETURN 9FT ADLT (ELECTROSURGICAL) ×2
ELECTRODE REM PT RTRN 9FT ADLT (ELECTROSURGICAL) ×1 IMPLANT
GAUZE KERLIX 2  STERILE LF (GAUZE/BANDAGES/DRESSINGS) ×4 IMPLANT
GLOVE BIO SURGEON STRL SZ7 (GLOVE) ×2 IMPLANT
GLOVE BIOGEL PI IND STRL 7.0 (GLOVE) ×1 IMPLANT
GLOVE BIOGEL PI IND STRL 7.5 (GLOVE) ×1 IMPLANT
GLOVE BIOGEL PI INDICATOR 7.0 (GLOVE) ×1
GLOVE BIOGEL PI INDICATOR 7.5 (GLOVE) ×1
GOWN PREVENTION PLUS LG XLONG (DISPOSABLE) ×2 IMPLANT
GOWN PREVENTION PLUS XLARGE (GOWN DISPOSABLE) ×2 IMPLANT
GOWN STRL NON-REIN LRG LVL3 (GOWN DISPOSABLE) ×2 IMPLANT
GOWN STRL REIN XL XLG (GOWN DISPOSABLE) ×2 IMPLANT
KIT BASIN OR (CUSTOM PROCEDURE TRAY) ×2 IMPLANT
LIGASURE IMPACT 36 18CM CVD LR (INSTRUMENTS) IMPLANT
NS IRRIG 1000ML POUR BTL (IV SOLUTION) ×4 IMPLANT
PACK GENERAL/GYN (CUSTOM PROCEDURE TRAY) ×2 IMPLANT
SCALPEL HARMONIC ACE (MISCELLANEOUS) IMPLANT
SHEARS FOC LG CVD HARMONIC 17C (MISCELLANEOUS) IMPLANT
SPONGE GAUZE 4X4 12PLY (GAUZE/BANDAGES/DRESSINGS) ×2 IMPLANT
SPONGE LAP 18X18 X RAY DECT (DISPOSABLE) ×4 IMPLANT
STAPLER VISISTAT 35W (STAPLE) IMPLANT
SUCTION POOLE TIP (SUCTIONS) ×2 IMPLANT
SUT PDS AB 1 CTX 36 (SUTURE) IMPLANT
SUT PDS AB 1 TP1 96 (SUTURE) IMPLANT
SUT SILK 2 0 (SUTURE)
SUT SILK 2 0 SH CR/8 (SUTURE) IMPLANT
SUT SILK 2-0 18XBRD TIE 12 (SUTURE) IMPLANT
SUT SILK 3 0 (SUTURE)
SUT SILK 3 0 SH CR/8 (SUTURE) IMPLANT
SUT SILK 3-0 18XBRD TIE 12 (SUTURE) IMPLANT
TAPE CLOTH SURG 4X10 WHT LF (GAUZE/BANDAGES/DRESSINGS) ×2 IMPLANT
TOWEL OR 17X26 10 PK STRL BLUE (TOWEL DISPOSABLE) ×2 IMPLANT
TRAY FOLEY CATH 14FRSI W/METER (CATHETERS) IMPLANT
YANKAUER SUCT BULB TIP NO VENT (SUCTIONS) ×2 IMPLANT

## 2010-12-18 NOTE — Consult Note (Signed)
Date of Admission:  12/18/2010  Date of Consult:  12/18/2010  Reason for Consult:medical management. Referring Physician: Dr. Tawni Levy  [name] is an 65 y.o. female.  HPI: patient is a 65 years old African  American female obese,with multiple medical problems was admitted today by the surgeon for abscess in the anterior abdominal wall abscess and subsequently had incision with debridement done. I was however consulted for management of medical problems. t Patient was  post procedure and  she denied any chest pain ,she denies any shortness of breath. she denied any nausea or vomiting. She also denies any fever, chills or rectal.She however complain of abdominal pain. Denied any dysuria, hematuria , diarrhea or hematochezia.  Past Medical History  Diagnosis Date  . Diabetes mellitus   . Hypertension   . CHF (congestive heart failure)   . Arthritis   . Abdominal abscess 12-17-10    abdominal abscesses x2 ? one at this time  . Staph aureus infection November 2012   . Myocardial infarction 12-17-10    80's-abnormal testing showed  . Swelling of both ankles 12-17-10  . Blood transfusion, without reported diagnosis 12-17-10    "thinks in [facility]    Medications:  Allergies: No Known Allergies  Social History:  reports that she quit smoking about 30 years ago. She has never used smokeless tobacco. She reports that she does not drink alcohol or use illicit drugs.  Family History  Problem Relation Age of Onset  . Hypertension Mother   . Cancer Brother     spine  . Cancer Sister     brain  . Hypertension Maternal Grandmother     Past Surgical History  Procedure Date  . Ventral hernia repair     component separation, repair with biologic  . Vaginal hysterectomy   . Cardiac catheterization 12-17-10    '80's  . Abdominal hysterectomy 12-17-10    Vaginal Hysterectomy  . Cataract extraction w/ intraocular lens implant 12-17-10    bilateral    Review of  Systems: The patient denies anorexia, fever, weight loss,, vision loss, decreased hearing, hoarseness, chest pain, syncope, dyspnea on exertion, peripheral edema, balance deficits, hemoptysis, abdominal pain+++, melena, hematochezia, severe indigestion/heartburn, hematuria, incontinence, genital sores, muscle weakness, suspicious skin lesions, transient blindness, difficulty walking, depression, unusual weight change, abnormal bleeding, enlarged lymph nodes, angioedema, and breast masses.  Blood pressure 189/87, pulse 61, temperature 97.6 F (36.4 C), temperature source Oral, resp. rate 12, SpO2 95.00%.  Physical Exam: vital signs as above heent-not in distress,pallor,perla,dehydrated Neck-no jvd Chest-decrease breath sounds at the lung bases secondary to body habitus.No adventitial sounds cvs-s1 and s2 no murmurs Ext-pedal edema Neuro-no focal Mss-arthritic changes in the knees and feet Neuro-psch-unremarkable   Results for orders placed during the hospital encounter of 12/18/10 (from the past 48 hour(s))  CBC     Status: Abnormal   Collection Time   12/18/10  7:00 AM      Component Value Range Comment   WBC 8.3  4.0 - 10.5 (K/uL)    RBC 2.60 (*) 3.87 - 5.11 (MIL/uL)    Hemoglobin 7.3 (*) 12.0 - 15.0 (g/dL)    HCT 23.3 (*) 36.0 - 46.0 (%)    MCV 89.6  78.0 - 100.0 (fL)    MCH 28.1  26.0 - 34.0 (pg)    MCHC 31.3  30.0 - 36.0 (g/dL)    RDW 15.4  11.5 - 15.5 (%)    Platelets 271  150 - 400 (  K/uL)   COMPREHENSIVE METABOLIC PANEL     Status: Abnormal   Collection Time   12/18/10  7:00 AM      Component Value Range Comment   Sodium 143  135 - 145 (mEq/L)    Potassium 4.5  3.5 - 5.1 (mEq/L)    Chloride 110  96 - 112 (mEq/L)    CO2 24  19 - 32 (mEq/L)    Glucose, Bld 143 (*) 70 - 99 (mg/dL)    BUN 59 (*) 6 - 23 (mg/dL)    Creatinine, Ser 4.01 (*) 0.50 - 1.10 (mg/dL)    Calcium 8.4  8.4 - 10.5 (mg/dL)    Total Protein 6.5  6.0 - 8.3 (g/dL)    Albumin 2.2 (*) 3.5 - 5.2 (g/dL)     AST 11  0 - 37 (U/L)    ALT 6  0 - 35 (U/L)    Alkaline Phosphatase 47  39 - 117 (U/L)    Total Bilirubin 0.1 (*) 0.3 - 1.2 (mg/dL)    GFR calc non Af Amer 11 (*) >90 (mL/min)    GFR calc Af Amer 12 (*) >90 (mL/min)   SURGICAL PCR SCREEN     Status: Normal   Collection Time   12/18/10  7:15 AM      Component Value Range Comment   MRSA, PCR NEGATIVE  NEGATIVE     Staphylococcus aureus NEGATIVE  NEGATIVE    GLUCOSE, CAPILLARY     Status: Abnormal   Collection Time   12/18/10  7:16 AM      Component Value Range Comment   Glucose-Capillary 143 (*) 70 - 99 (mg/dL)   GLUCOSE, CAPILLARY     Status: Abnormal   Collection Time   12/18/10 10:52 AM      Component Value Range Comment   Glucose-Capillary 122 (*) 70 - 99 (mg/dL)    Comment 1 Documented in Chart       Chest 2 View  12/18/2010  *RADIOLOGY REPORT*  Clinical Data: Preoperative assessment, hypertension, diabetes, former smoker  CHEST - 2 VIEW  Comparison: 07/02/2010  Findings: Enlargement of cardiac silhouette. Calcified tortuous aorta. Pulmonary vascularity normal. Lungs clear. No pleural effusion or pneumothorax. Bones unremarkable.  IMPRESSION: Enlargement of cardiac silhouette. No acute abnormalities.  Original Report Authenticated By: Burnetta Sabin, M.D.   Problems: #1 abdominal wall abscess #2 anemia. #3 chronic kidney disease #4 dehydration #5 pedal edema.  Impression: #1 anterior abdominal wall abscess status post incision and debridement. #2 anemia #3 history of congestive heart #4 chronic kidney disease #5 hypertension #6 diabetes mellitus #7 arthritis  Plan: #1 with continued IV hydration with lactated ringer solution. #2 Add Flagyl to antibiotic regimen #3 transfuse with 2 units of packed RBCs. #4 control blood sugar with Lantus insulin as well as insulin sliding-scale. #5 will stat hydralazine for adequate blood pressure control. #6 continue diuresis with Lasix for congestive heart failure. #7 GI  prophylaxis with Protonix. #8 DVT prophylaxix with TED hose #9 she'll be evaluated on daily basis. Thank you for allowing me to participate in the management of this patient   Mayer Vondrak 12/18/2010, 11:54 AM

## 2010-12-18 NOTE — Transfer of Care (Signed)
Immediate Anesthesia Transfer of Care Note  Patient: [name]  Procedure(s) Performed:  EXPLORATORY LAPAROTOMY - Abdominal Seroma Evacuation  Patient Location: PACU  Anesthesia Type: General  Level of Consciousness: awake, alert  and oriented  Airway & Oxygen Therapy: Patient Spontanous Breathing and Patient connected to face mask oxygen  Post-op Assessment: Report given to PACU RN and Post -op Vital signs reviewed and stable  Post vital signs: Reviewed and stable  Complications: No apparent anesthesia complications

## 2010-12-18 NOTE — H&P (View-Only) (Signed)
Patient ID: [name], female   DOB: August 18, 1945, 65 y.o.   MRN: [id number]  Chief Complaint  Patient presents with  . Routine Post Op    recheck abdominal drain     HPI [name] is a 65 y.o. female.   HPI This is a 65 year old female with multiple medical problems who presented to me while she was in the hospital with an incarcerated ventral hernia. She was thought not to be an operative candidate to this point but this did not appear to be resolving so I recommended to her laparotomy. I ended up doing an exploratory laparotomy with bowel resection as well as a primary hernia repair. She did fairly well after this but had a long hospital stay. Since then she has had continued abdominal wall seromas. These have been treated percutaneously. The last one showed up again and has been drained but this is still putting out purulent fluid. She is not systemically ill but conservative treatment has not been successful at this point. She does continue to have foul-smelling discharge from this drain. She comes in today to discuss her next options.  Past Medical History  Diagnosis Date  . Diabetes mellitus   . Hypertension   . CHF (congestive heart failure)   . Blood transfusion, without reported diagnosis   . Arthritis   . Abdominal abscess   . Staph aureus infection November 2012     Past Surgical History  Procedure Date  . Ventral hernia repair     component separation, repair with biologic  . Cardiac catheterization   . Vaginal hysterectomy     Family History  Problem Relation Age of Onset  . Hypertension Mother   . Cancer Brother     spine  . Cancer Sister     brain  . Hypertension Maternal Grandmother     Social History History  Substance Use Topics  . Smoking status: Former Research scientist (life sciences)  . Smokeless tobacco: Never Used  . Alcohol Use: No    No Known Allergies  Current Outpatient Prescriptions  Medication Sig Dispense Refill  . amLODipine (NORVASC) 10 MG tablet  Take 10 mg by mouth daily.        Marland Kitchen aspirin 81 MG tablet Take 81 mg by mouth daily.        . calcitRIOL (ROCALTROL) 0.25 MCG capsule Take 0.25 mcg by mouth daily.        . calcium carbonate 200 MG capsule Take 250 mg by mouth daily.        Marland Kitchen CLONIDINE HCL PO Take by mouth daily.        Marland Kitchen dextrose 5 % SOLN 50 mL with nafcillin 2 G SOLR 2 g Inject 2 g into the vein every 6 (six) hours.        Marland Kitchen doxycycline (VIBRA-TABS) 100 MG tablet Take 1 tablet (100 mg total) by mouth 2 (two) times daily.  42 tablet  2  . enoxaparin (LOVENOX) 40 MG/0.4ML SOLN Inject into the skin 2 (two) times daily.        . ferrous sulfate 325 (65 FE) MG tablet Take 325 mg by mouth daily with breakfast.        . furosemide (LASIX) 80 MG tablet Take 80 mg by mouth daily with breakfast. 40 mg Qpm       . HYDROcodone-acetaminophen (VICODIN) 5-500 MG per tablet Take 1 tablet by mouth every 4 (four) hours as needed for pain.  20 tablet  0  . HYDROcodone-acetaminophen (  VICODIN) 5-500 MG per tablet Take 1 tablet by mouth every 4 (four) hours as needed for pain.  20 tablet  0  . insulin glargine (LANTUS) 100 UNIT/ML injection Inject 10 Units into the skin at bedtime.        . INSULIN REGULAR HUMAN IJ Inject 1-9 Units as directed daily. Sliding scale       . isosorbide mononitrate (IMDUR) 60 MG 24 hr tablet Take 60 mg by mouth daily.        . metoprolol (TOPROL-XL) 50 MG 24 hr tablet Take 50 mg by mouth daily.        . Multiple Vitamin (MULTIVITAMIN PO) Take by mouth daily.        . Nutritional Supplements (GLUCERNA MEAL PO) Take by mouth daily.        . rosuvastatin (CRESTOR) 20 MG tablet Take 20 mg by mouth daily.          Review of Systems Review of Systems  Constitutional: Negative for fever, chills and unexpected weight change.  HENT: Negative for hearing loss, congestion, sore throat, trouble swallowing and voice change.   Eyes: Negative for visual disturbance.  Respiratory: Negative for cough and wheezing.     Cardiovascular: Positive for leg swelling. Negative for chest pain and palpitations.  Gastrointestinal: Positive for abdominal pain. Negative for nausea, vomiting, diarrhea, constipation, blood in stool, abdominal distention and anal bleeding.  Genitourinary: Negative for hematuria, vaginal bleeding and difficulty urinating.  Musculoskeletal: Positive for arthralgias.  Skin: Negative for rash and wound.  Neurological: Negative for seizures, syncope and headaches.  Hematological: Negative for adenopathy. Does not bruise/bleed easily.  Psychiatric/Behavioral: Negative for confusion.    Blood pressure 132/84, pulse 64, temperature 97.2 F (36.2 C), temperature source Temporal, resp. rate 18, height 5\' 5"  (1.651 m), weight 284 lb (128.822 kg).  Physical Exam Physical Exam  Constitutional: She appears well-developed and well-nourished.  Eyes: No scleral icterus.  Neck: Neck supple.  Cardiovascular: Normal rate, regular rhythm and normal heart sounds.   Pulmonary/Chest: Effort normal and breath sounds normal. She has no wheezes. She has no rales.  Abdominal: Soft. Bowel sounds are normal. She exhibits no ascites. There is no tenderness. No hernia.    Lymphadenopathy:    She has no cervical adenopathy.    Data Reviewed *RADIOLOGY REPORT*  Clinical Data: Abdominal wall abscess with percutaneous drainage.  Drain placed on October 18, 2010  CT ABDOMEN AND PELVIS WITHOUT CONTRAST  Technique: Multidetector CT imaging of the abdomen and pelvis was  performed following the standard protocol without intravenous  contrast.  Comparison: CT 10/14/2010  Findings:  There is a percutaneous drainage catheter within the midline  ventral abdominal wall abscess. The abscess is superficial to the  abdominal wall mesh and fascia. The abscess has decreased in  volume significantly compared to prior measuring 7.3 x 2.2 cm  compared to 8.5 x 3.6 cm on prior. There does appear to be a small  pocket of  fluid just inferior to the pigtail which may not  communicate with the larger fluid collection. This measures 4.1 x  2.6 cm (image 70).  Lung bases are clear. Non-IV contrast images demonstrate no  hepatic abnormality. The gallbladder, pancreas, spleen, adrenal  glands and kidneys are unchanged. The kidneys are atrophic. The  stomach is normal. Small hiatal hernia. No evidence of bowel  obstruction. No intra abdominal fluid collections or free air.  Abdominal aorta normal caliber. No retroperitoneal  lymphadenopathy. No free fluid the pelvis.  Bladder is normal.  Post hysterectomy anatomy.  IMPRESSION:  1. Significant reduction in volume of ventral abdominal wall  abscess following percutaneous drainage placement.  2. There is a small pocket of fluid just inferiorly catheter which  may represent a loculated abscess or potential small seroma which  may not communicate with the larger cavity.  3. No evidence of intraperitoneal free air, fluid, or abscess.   Assessment    Infected seroma    Plan    She had an emergency ventral hernia repair with a seroma postoperatively. This area has not gone away with conservative measures and she continues to have infected fluid coming out of this drain. She is not systemically ill. I think the only choice at this point is going to be going back to the operating room opening this up and let it heal by secondary intention. We discussed this today. There are certainly some risk given her general medical status including myocardial infarction, arrhythmia, stroke, pneumonia, mechanical ventilation. There are also the risks of bleeding, it continued infection, recurrence of her hernia, prolonged wound healing. She is understandable of all of this and we will plan on doing this this week. I discussed this with her cardiologist already.      Davied Nocito 12/16/2010, 10:55 AM

## 2010-12-18 NOTE — Interval H&P Note (Signed)
History and Physical Interval Note:   12/18/2010   8:08 AM   [name]  has presented today for surgery, with the diagnosis of infected seroma  The various methods of treatment have been discussed with the patient and family. After consideration of risks, benefits and other options for treatment, the patient has consented to  Procedure(s): Drainage of abdominal wall seroma and likely vac placement as a surgical intervention .  The patients' history has been reviewed, patient examined, no change in status, stable for surgery.  I have reviewed the patients' chart and labs.  Questions were answered to the patient's satisfaction.     [facility]  MD

## 2010-12-18 NOTE — Anesthesia Preprocedure Evaluation (Addendum)
Anesthesia Evaluation  Patient identified by MRN, date of birth, ID band Patient awake    Reviewed: Allergy & Precautions, H&P , NPO status , Patient's Chart, lab work & pertinent test results, reviewed documented beta blocker date and time   Airway Mallampati: II TM Distance: >3 FB Neck ROM: full    Dental No notable dental hx. (+) Edentulous Upper and Edentulous Lower   Pulmonary neg pulmonary ROS,  clear to auscultation  Pulmonary exam normal       Cardiovascular Exercise Tolerance: Good hypertension, On Home Beta Blockers + Past MI, +CHF and neg cardio ROS regular Normal    Neuro/Psych Negative Neurological ROS  Negative Psych ROS   GI/Hepatic negative GI ROS, Neg liver ROS, PUD,   Endo/Other  Negative Endocrine ROSDiabetes mellitus-, Well Controlled, Type 2, Insulin DependentMorbid obesity  Renal/GU negative Renal ROS  Genitourinary negative   Musculoskeletal   Abdominal Normal abdominal exam  (+) obese,   Peds  Hematology negative hematology ROS (+)   Anesthesia Other Findings   Reproductive/Obstetrics negative OB ROS                         Anesthesia Physical Anesthesia Plan  ASA: III  Anesthesia Plan: General   Post-op Pain Management:    Induction: Intravenous  Airway Management Planned: Oral ETT  Additional Equipment:   Intra-op Plan:   Post-operative Plan: Extubation in OR  Informed Consent: I have reviewed the patients History and Physical, chart, labs and discussed the procedure including the risks, benefits and alternatives for the proposed anesthesia with the patient or authorized representative who has indicated his/her understanding and acceptance.   Dental Advisory Given  Plan Discussed with: CRNA and Surgeon  Anesthesia Plan Comments:        Anesthesia Quick Evaluation

## 2010-12-18 NOTE — Op Note (Signed)
Preoperative diagnosis: Infected abdominal wall fluid collection  Postoperative diagnosis: Same as above  Procedure: Incision and drainage of abdominal wall fluid collection and debridement of abscess cavity, cultures  Surgeon: Dr. Serita Grammes  Anesthesia: Gen.  Estimated blood loss: [id number] cc  Complications: None  Disposition: To recovery room in stable condition  Specimens: Abdominal wall tissue and cultures for aerobic and anaerobic  Indications: This is a 65 year old female who I know well from a ventral hernia repair that was done primarily with a biologic mesh as well a number of months ago. This was done for incarcerated hernia at the time. She has multiple comorbidities and has had trouble with abdominal wall fluid collection after her drains were removed. I've attempted to treat this conservatively with percutaneous drainage but this has been unsuccessful. She developed symptoms again recently and had a drain placement with purulent fluid present. She and I now discussed going to the operating room and widely opening this cavity to let this heal by secondary intention. We discussed multiple risks given her medical comorbidities.  Procedure: After informed consent was obtained the patient was taken to the operating room. She was administered vancomycin. Sequential compression devices were placed on her lower extremities prior to induction of anesthesia. She was then placed under general anesthesia without complication. Her abdomen was then prepped and draped in the standard sterile surgical fashion. I left a drain in place but I cut it to the skin level. A surgical timeout was then performed.  I reviewed her CT scan prior to beginning. I made a elliptical incision and removed the large paddle of skin in the region of the abscess. I dissected down and followed the drain to the abscess cavity. This had been mostly adequately drained but there was a very chronic abscess cavity. It did not  appear that her hernia had returned and I did not see any bowel anywhere present. I expected this from the CT scan as well. I eventually removed the drain and debride the abscess cavity widely removed a fair amount of this chronic abscess tissue that was present as well I was able to visualize the biologic mesh as well as the ingrowth of her tissue around it also. There was some murky fluid present in the cavity that was somewhat foul-smelling. I did obtain cultures of this. Hemostasis was obtained I irrigated this copiously once I widely opened the cavity. I feel confident that I opened [place] enough that we'll be able to do dressing changes and hopefully this will now heal by secondary intention. I packed this with saline soaked gauze. A dressing was then placed over this. She was extubated in the operating room and transferred to the recovery room in stable condition.

## 2010-12-18 NOTE — Anesthesia Postprocedure Evaluation (Signed)
  Anesthesia Post-op Note  Patient: [name]  Procedure(s) Performed:  EXPLORATORY LAPAROTOMY - Abdominal Seroma Evacuation  Patient Location: PACU  Anesthesia Type: General  Level of Consciousness: awake and alert   Airway and Oxygen Therapy: Patient Spontanous Breathing with oxygen  Post-op Pain: mild  Post-op Assessment: Post-op Vital signs reviewed, Patient's Cardiovascular Status Stable, Respiratory Function Stable, Patent Airway and No signs of Nausea or vomiting  Post-op Vital Signs: stable  Complications: No apparent anesthesia complications

## 2010-12-19 ENCOUNTER — Encounter (HOSPITAL_COMMUNITY): Admission: RE | Payer: Self-pay | Source: Ambulatory Visit

## 2010-12-19 ENCOUNTER — Ambulatory Visit (HOSPITAL_COMMUNITY)
Admission: RE | Admit: 2010-12-19 | Payer: No Typology Code available for payment source | Source: Ambulatory Visit | Admitting: General Surgery

## 2010-12-19 LAB — DIFFERENTIAL
Basophils Absolute: 0.1 10*3/uL (ref 0.0–0.1)
Eosinophils Relative: 2 % (ref 0–5)
Lymphocytes Relative: 18 % (ref 12–46)
Monocytes Absolute: 0.8 10*3/uL (ref 0.1–1.0)
Monocytes Relative: 9 % (ref 3–12)

## 2010-12-19 LAB — COMPREHENSIVE METABOLIC PANEL
AST: 10 U/L (ref 0–37)
BUN: 50 mg/dL — ABNORMAL HIGH (ref 6–23)
CO2: 24 mEq/L (ref 19–32)
Calcium: 8.5 mg/dL (ref 8.4–10.5)
Chloride: 109 mEq/L (ref 96–112)
Creatinine, Ser: 3.73 mg/dL — ABNORMAL HIGH (ref 0.50–1.10)
GFR calc Af Amer: 14 mL/min — ABNORMAL LOW (ref >90)
GFR calc non Af Amer: 12 mL/min — ABNORMAL LOW (ref >90)
Glucose, Bld: 105 mg/dL — ABNORMAL HIGH (ref 70–99)
Total Bilirubin: 0.2 mg/dL — ABNORMAL LOW (ref 0.3–1.2)

## 2010-12-19 LAB — CBC
HCT: 28.3 % — ABNORMAL LOW (ref 36.0–46.0)
Hemoglobin: 9 g/dL — ABNORMAL LOW (ref 12.0–15.0)
MCV: 88.4 fL (ref 78.0–100.0)
RDW: 15.2 % (ref 11.5–15.5)
WBC: 9.5 10*3/uL (ref 4.0–10.5)

## 2010-12-19 LAB — GLUCOSE, CAPILLARY
Glucose-Capillary: 104 mg/dL — ABNORMAL HIGH (ref 70–99)
Glucose-Capillary: 121 mg/dL — ABNORMAL HIGH (ref 70–99)

## 2010-12-19 LAB — HEMOGLOBIN A1C
Hgb A1c MFr Bld: 6.2 % — ABNORMAL HIGH (ref <5.7)
Mean Plasma Glucose: 131 mg/dL — ABNORMAL HIGH (ref <117)

## 2010-12-19 LAB — TYPE AND SCREEN

## 2010-12-19 SURGERY — LAPAROTOMY, EXPLORATORY
Anesthesia: General | Site: Abdomen

## 2010-12-19 MED ORDER — OXYCODONE-ACETAMINOPHEN 5-325 MG PO TABS: 1.0000 | ORAL_TABLET | ORAL | Status: DC | PRN

## 2010-12-19 MED ORDER — DIPHENHYDRAMINE HCL 25 MG PO TABS
25.0000 mg | ORAL_TABLET | Freq: Four times a day (QID) | ORAL | Status: DC | PRN
  Filled 2010-12-19: qty 1

## 2010-12-19 MED ORDER — PNEUMOCOCCAL VAC POLYVALENT 25 MCG/0.5ML IJ INJ
0.5000 mL | INJECTION | Freq: Once | INTRAMUSCULAR | Status: DC
  Filled 2010-12-19: qty 0.5

## 2010-12-19 MED ORDER — DIPHENHYDRAMINE HCL 25 MG PO CAPS
25.0000 mg | ORAL_CAPSULE | Freq: Four times a day (QID) | ORAL | Status: DC | PRN
  Filled 2010-12-19: qty 1

## 2010-12-19 MED ORDER — DIPHENHYDRAMINE HCL 25 MG PO CAPS
25.0000 mg | ORAL_CAPSULE | Freq: Once | ORAL | Status: AC
  Administered 2010-12-19: 25 mg via ORAL
  Filled 2010-12-19: qty 1

## 2010-12-19 MED ORDER — DOXYCYCLINE HYCLATE 100 MG PO TABS: 100.0000 mg | ORAL_TABLET | Freq: Two times a day (BID) | ORAL | Status: DC

## 2010-12-19 MED ORDER — METRONIDAZOLE 500 MG PO TABS: 500.0000 mg | ORAL_TABLET | Freq: Three times a day (TID) | ORAL | Status: DC

## 2010-12-19 MED ORDER — INFLUENZA VIRUS VACC SPLIT PF IM SUSP
0.5000 mL | Freq: Once | INTRAMUSCULAR | Status: DC
  Filled 2010-12-19: qty 0.5

## 2010-12-19 MED ORDER — DIPHENHYDRAMINE HCL 25 MG PO CAPS
25.0000 mg | ORAL_CAPSULE | Freq: Four times a day (QID) | ORAL | Status: DC | PRN
  Administered 2010-12-19 – 2010-12-20 (×2): 25 mg via ORAL
  Filled 2010-12-19 (×2): qty 1

## 2010-12-19 MED ORDER — HYDROCODONE-ACETAMINOPHEN 5-325 MG PO TABS
1.0000 | ORAL_TABLET | ORAL | Status: DC | PRN
  Administered 2010-12-19 – 2010-12-23 (×10): 1 via ORAL
  Filled 2010-12-19 (×10): qty 1

## 2010-12-19 NOTE — Discharge Summary (Addendum)
Physician Discharge Summary  Patient ID: [name] MRN: [id number] DOB/AGE: Mar 23, 1945 65 y.o.  Admit date: 12/18/2010 Discharge date: 12/19/2010  Admission Diagnoses: Infected abdominal wall seroma Diabetes mellitus  Hypertension  CHF (congestive heart failure) Arthritis  Staph aureus infection   chronic anemia  Discharge Diagnoses:  Same as above  Discharged Condition: stable  Hospital Course:  This is a 65 year old female who I know well from a repair of an incarcerated ventral hernia in the remote past. She has had multiple issues with abdominal wall seromas. I've attempted to treat these conservatively. This has been unsuccessful and the last time she underwent a percutaneous drainage she is. The fluid coming out of that drained. She and I discussed admission and drainage of this operatively. She was admitted and taken to the operating room. She underwent incision and drainage of an infected abdominal wall seroma. I debrided a fair amount of tissue in this chronic abscess cavity left this open and packed it. She has chronic renal insufficiency as well as chronic anemia. I asked the hospitalist service to see her postoperatively. They recommended a blood transfusion as well as helping with the management of multiple medical problems. She is now on doxycycline and Flagyl. She is doing well and I think she can be discharged either to home or a skilled nursing facility pending sitting up or dressing changes. She will be maintained on antibiotics. She will need twice daily wet to dry normal saline dressing changes for the foreseeable future.  Consults: hospitalist service  Significant Diagnostic Studies: microbiology: pending  Treatments: surgery: 18 December 2010 incision, drainage of infected abdominal wall seroma  Discharge Exam: Blood pressure 147/74, pulse 81, temperature 99.1 F (37.3 C), temperature source Oral, resp. rate 18, height 5\' 5"  (1.651 m), weight 272 lb (123.378  kg), SpO2 99.00%. Resp: clear to auscultation bilaterally Cardio: regular rate and rhythm, S1, S2 normal, no murmur, click, rub or gallop GI: soft, nontender, bowel sounds present, open wound with expected drainage  Disposition: Home or Self Care  Discharge Orders    Future Appointments: Provider: Department: Dept Phone: Center:   12/30/2010 8:30 AM [facility]  None   01/13/2011 8:30 AM [facility]  None     Current Discharge Medication List    START taking these medications   Details  metroNIDAZOLE (FLAGYL) 500 MG tablet Take 1 tablet (500 mg total) by mouth 3 (three) times daily. Qty: 42 tablet, Refills: 0    oxyCODONE-acetaminophen (PERCOCET) 5-325 MG per tablet Take 1-2 tablets by mouth every 4 (four) hours as needed. Qty: 30 tablet, Refills: 0      CONTINUE these medications which have CHANGED   Details  !! doxycycline (VIBRA-TABS) 100 MG tablet Take 1 tablet (100 mg total) by mouth 2 (two) times daily. Qty: 30 tablet, Refills: 0     !! - Potential duplicate medications found. Please discuss with provider.    CONTINUE these medications which have NOT CHANGED   Details  amLODipine (NORVASC) 10 MG tablet Take 10 mg by mouth every morning.     aspirin EC 81 MG tablet Take 81 mg by mouth daily.      calcitRIOL (ROCALTROL) 0.25 MCG capsule Take 0.25 mcg by mouth daily.      calcium carbonate 200 MG capsule Take 200 mg by mouth daily.     cloNIDine (CATAPRES) 0.2 MG tablet Take 0.2 mg by mouth daily.      !! doxycycline (  VIBRA-TABS) 100 MG tablet Take 1 tablet (100 mg total) by mouth 2 (two) times daily. Qty: 42 tablet, Refills: 2    ferrous sulfate 325 (65 FE) MG tablet Take 325 mg by mouth daily with breakfast.      furosemide (LASIX) 80 MG tablet Take 40-80 mg by mouth as directed. Take 1 tablet in the morning and 1/2 tablet in the evening    insulin glargine (LANTUS) 100 UNIT/ML injection Inject 10  Units into the skin at bedtime.      INSULIN REGULAR HUMAN IJ Inject 1-9 Units as directed 3 (three) times daily. Per Sliding scale    isosorbide mononitrate (IMDUR) 60 MG 24 hr tablet Take 60 mg by mouth daily.     metoprolol (TOPROL-XL) 50 MG 24 hr tablet Take 50 mg by mouth every morning.     Multiple Vitamin (MULTIVITAMIN PO) Take by mouth daily.      Nutritional Supplements (GLUCERNA MEAL PO) Take 237 mLs by mouth daily.     rosuvastatin (CRESTOR) 20 MG tablet Take 20 mg by mouth daily.       !! - Potential duplicate medications found. Please discuss with provider.    STOP taking these medications     HYDROcodone-acetaminophen (VICODIN) 5-500 MG per tablet      dextrose 5 % SOLN 50 mL with nafcillin 2 G SOLR 2 g          Signed: Jyla Hopf 12/19/2010, 9:47 AM

## 2010-12-19 NOTE — Progress Notes (Addendum)
1 Day Post-Op  Subjective: Doing well this am, voiding, tol clears  Objective: Vital signs in last 24 hours: Temp:  [97.3 F (36.3 C)-99.8 F (37.7 C)] 99.1 F (37.3 C) (11/29 0315) Pulse Rate:  [57-81] 81  (11/29 0619) Resp:  [12-20] 20  (11/29 0315) BP: (139-202)/(68-101) 147/74 mmHg (11/29 0621) SpO2:  [93 %-100 %] 99 % (11/29 0315) Weight:  [272 lb (123.378 kg)] 272 lb (123.378 kg) (11/28 2012) Last BM Date: 12/18/10  Intake/Output from previous day: 11/28 0701 - 11/29 0700 In: 3603 [P.O.:1080; I.V.:2174; Blood:349] Out: [id number] [Urine:4450; Blood:25] Intake/Output this shift: Total I/O In: 2243 [P.O.:720; I.V.:1174; Blood:349] Out: 3050 [Urine:3050]  Resp: clear to auscultation bilaterally Cardio: regular rate and rhythm, S1, S2 normal, no murmur, click, rub or gallop GI: soft, obese, has bowel sounds, wound open and draining as expected  Lab Results:   Basename 12/19/10 0400 12/18/10 0700  WBC 9.5 8.3  HGB 9.0* 7.3*  HCT 28.3* 23.3*  PLT 207 271   BMET  Basename 12/19/10 0400 12/18/10 0700  NA 142 143  K 4.3 4.5  CL 109 110  CO2 24 24  GLUCOSE 105* 143*  BUN 50* 59*  CREATININE 3.73* 4.01*  CALCIUM 8.5 8.4   PT/INR No results found for this basename: LABPROT:2,INR:2 in the last 72 hours ABG No results found for this basename: PHART:2,PCO2:2,PO2:2,HCO3:2 in the last 72 hours  Studies/Results: Chest 2 View  12/18/2010  *RADIOLOGY REPORT*  Clinical Data: Preoperative assessment, hypertension, diabetes, former smoker  CHEST - 2 VIEW  Comparison: 07/02/2010  Findings: Enlargement of cardiac silhouette. Calcified tortuous aorta. Pulmonary vascularity normal. Lungs clear. No pleural effusion or pneumothorax. Bones unremarkable.  IMPRESSION: Enlargement of cardiac silhouette. No acute abnormalities.  Original Report Authenticated By: Burnetta Sabin, M.D.    Anti-infectives: Anti-infectives     Start     Dose/Rate Route Frequency Ordered Stop   12/18/10 1500    doxycycline (VIBRA-TABS) tablet 100 mg        100 mg Oral 2 times daily 12/18/10 1305     12/18/10 1300   metroNIDAZOLE (FLAGYL) IVPB 500 mg        500 mg 100 mL/hr over 60 Minutes Intravenous Every 8 hours 12/18/10 1153     12/18/10 0000   vancomycin (VANCOCIN) 1,500 mg in sodium chloride 0.9 % 500 mL IVPB        1,500 mg 250 mL/hr over 120 Minutes Intravenous 120 min pre-op 12/17/10 2058 12/18/10 [id number]          Assessment/Plan: s/p Procedure(s): EXPLORATORY LAPAROTOMY appreciate hospitalist evaluation She can be discharged today if stable medically on antibiotics Needs bid dressing changes set up for home health If she is unable to do this dressing then we will need to have her go to snf for short period   LOS: 1 day    [facility] 12/19/2010

## 2010-12-19 NOTE — Progress Notes (Signed)
CSW spoke with pt and completed psychosocial assessment (pls see shadow chart). Pt states she is doing okay and she knows that she can not care for herself at home. Pt lives alone and has some support from her goddaughter. Pt is agreeable to short term rehab and CSW provided her with the list of facilities. CSW will complete an FL-2 and place in the pts chart. Pt reports she does not have a preference of a facility. CSW will continue to follow and offer support.  Luane School, [place] 12/19/2010 12:06 PM [id number]

## 2010-12-19 NOTE — Progress Notes (Signed)
Patient ID: [name], female   DOB: 1945-03-24, 65 y.o.   MRN: [id number].. Subjective: Patient seen.Feels better.Awating rehab placement  Objective: Weight change:   Intake/Output Summary (Last 24 hours) at 12/19/10 1624 Last data filed at 12/19/10 1400  Gross per 24 hour  Intake   2873 ml  Output   4450 ml  Net  -1577 ml   BP 147/77  Pulse 76  Temp(Src) 99 F (37.2 C) (Oral)  Resp 18  Ht 5\' 5"  (1.651 m)  Wt 123.378 kg (272 lb)  BMI 45.26 kg/m2  SpO2 95% Physical Exam: General appearance: alert, cooperative and no distress,pallor Head: Normocephalic, without obvious abnormality, atraumatic Neck: no adenopathy, no carotid bruit, no JVD, supple, symmetrical, trachea midline and thyroid not enlarged, symmetric, no tenderness/mass/nodules Lungs: clear to auscultation bilaterally Heart: regular rate and rhythm, S1, S2 normal, no murmur, click, rub or gallop Abdomen: soft, slightly tender;surgical site clean and dry, bowel sounds normal; no masses,  no organomegaly Extremities: extremities normal, atraumatic, no cyanosis,pedal edema Skin: Skin color, texture, turgor normal. No rashes or lesions  Lab Results: Results for orders placed during the hospital encounter of 12/18/10 (from the past 48 hour(s))  CBC     Status: Abnormal   Collection Time   12/18/10  7:00 AM      Component Value Range Comment   WBC 8.3  4.0 - 10.5 (K/uL)    RBC 2.60 (*) 3.87 - 5.11 (MIL/uL)    Hemoglobin 7.3 (*) 12.0 - 15.0 (g/dL)    HCT 23.3 (*) 36.0 - 46.0 (%)    MCV 89.6  78.0 - 100.0 (fL)    MCH 28.1  26.0 - 34.0 (pg)    MCHC 31.3  30.0 - 36.0 (g/dL)    RDW 15.4  11.5 - 15.5 (%)    Platelets 271  150 - 400 (K/uL)   COMPREHENSIVE METABOLIC PANEL     Status: Abnormal   Collection Time   12/18/10  7:00 AM      Component Value Range Comment   Sodium 143  135 - 145 (mEq/L)    Potassium 4.5  3.5 - 5.1 (mEq/L)    Chloride 110  96 - 112 (mEq/L)    CO2 24  19 - 32 (mEq/L)    Glucose, Bld 143  (*) 70 - 99 (mg/dL)    BUN 59 (*) 6 - 23 (mg/dL)    Creatinine, [name] 4.01 (*) 0.50 - 1.10 (mg/dL)    Calcium 8.4  8.4 - 10.5 (mg/dL)    Total Protein 6.5  6.0 - 8.3 (g/dL)    Albumin 2.2 (*) 3.5 - 5.2 (g/dL)    AST 11  0 - 37 (U/L)    ALT 6  0 - 35 (U/L)    Alkaline Phosphatase 47  39 - 117 (U/L)    Total Bilirubin 0.1 (*) 0.3 - 1.2 (mg/dL)    GFR calc non Af Amer 11 (*) >90 (mL/min)    GFR calc Af Amer 12 (*) >90 (mL/min)   SURGICAL PCR SCREEN     Status: Normal   Collection Time   12/18/10  7:15 AM      Component Value Range Comment   MRSA, PCR NEGATIVE  NEGATIVE     Staphylococcus aureus NEGATIVE  NEGATIVE    GLUCOSE, CAPILLARY     Status: Abnormal   Collection Time   12/18/10  7:16 AM      Component Value Range Comment   Glucose-Capillary 143 (*) 70 -  99 (mg/dL)   ANAEROBIC CULTURE     Status: Normal (Preliminary result)   Collection Time   12/18/10  9:45 AM      Component Value Range Comment   Specimen Description ABDOMEN      Special Requests PATIENT ON FOLLOWING VANC      Gram Stain        Value: NO WBC SEEN     NO SQUAMOUS EPITHELIAL CELLS SEEN     MODERATE GRAM VARIABLE ROD   Culture        Value: NO ANAEROBES ISOLATED; CULTURE IN PROGRESS FOR 5 DAYS   Report Status PENDING     BODY FLUID CULTURE     Status: Normal (Preliminary result)   Collection Time   12/18/10  9:45 AM      Component Value Range Comment   Specimen Description ABDOMEN      Special Requests PATIENT ON FOLLOWING VANC      Gram Stain        Value: NO WBC SEEN     FEW GRAM VARIABLE ROD   Culture MODERATE GRAM NEGATIVE RODS      Report Status PENDING     GLUCOSE, CAPILLARY     Status: Abnormal   Collection Time   12/18/10 10:52 AM      Component Value Range Comment   Glucose-Capillary 122 (*) 70 - 99 (mg/dL)    Comment 1 Documented in Chart     PREPARE RBC (CROSSMATCH)     Status: Normal   Collection Time   12/18/10 12:30 PM      Component Value Range Comment   Order Confirmation ORDER  PROCESSED BY BLOOD BANK     TYPE AND SCREEN     Status: Normal   Collection Time   12/18/10 12:49 PM      Component Value Range Comment   ABO/RH(D) A POS      Antibody Screen NEG      Sample Expiration 12/21/2010      Unit Number [id number]      Blood Component Type RED CELLS,LR      Unit division 00      Status of Unit ISSUED,FINAL      Transfusion Status OK TO TRANSFUSE      Crossmatch Result Compatible      Unit Number [id number]      Blood Component Type RED CELLS,LR      Unit division 00      Status of Unit ISSUED,FINAL      Transfusion Status OK TO TRANSFUSE      Crossmatch Result Compatible     GLUCOSE, CAPILLARY     Status: Abnormal   Collection Time   12/18/10  1:34 PM      Component Value Range Comment   Glucose-Capillary 101 (*) 70 - 99 (mg/dL)   GLUCOSE, CAPILLARY     Status: Abnormal   Collection Time   12/18/10  4:08 PM      Component Value Range Comment   Glucose-Capillary 133 (*) 70 - 99 (mg/dL)   GLUCOSE, CAPILLARY     Status: Abnormal   Collection Time   12/18/10  8:15 PM      Component Value Range Comment   Glucose-Capillary 106 (*) 70 - 99 (mg/dL)    Comment 1 Notify RN      Comment 2 Documented in Chart     GLUCOSE, CAPILLARY     Status: Abnormal   Collection Time   12/18/10 11:54 PM  Component Value Range Comment   Glucose-Capillary 101 (*) 70 - 99 (mg/dL)    Comment 1 Notify RN     CBC     Status: Abnormal   Collection Time   12/19/10  4:00 AM      Component Value Range Comment   WBC 9.5  4.0 - 10.5 (K/uL)    RBC 3.20 (*) 3.87 - 5.11 (MIL/uL)    Hemoglobin 9.0 (*) 12.0 - 15.0 (g/dL)    HCT 28.3 (*) 36.0 - 46.0 (%)    MCV 88.4  78.0 - 100.0 (fL)    MCH 28.1  26.0 - 34.0 (pg)    MCHC 31.8  30.0 - 36.0 (g/dL)    RDW 15.2  11.5 - 15.5 (%)    Platelets 207  150 - 400 (K/uL)   DIFFERENTIAL     Status: Normal   Collection Time   12/19/10  4:00 AM      Component Value Range Comment   Neutrophils Relative 71  43 - 77 (%)    Neutro Abs 6.7   1.7 - 7.7 (K/uL)    Lymphocytes Relative 18  12 - 46 (%)    Lymphs Abs 1.7  0.7 - 4.0 (K/uL)    Monocytes Relative 9  3 - 12 (%)    Monocytes Absolute 0.8  0.1 - 1.0 (K/uL)    Eosinophils Relative 2  0 - 5 (%)    Eosinophils Absolute 0.2  0.0 - 0.7 (K/uL)    Basophils Relative 1  0 - 1 (%)    Basophils Absolute 0.1  0.0 - 0.1 (K/uL)   COMPREHENSIVE METABOLIC PANEL     Status: Abnormal   Collection Time   12/19/10  4:00 AM      Component Value Range Comment   Sodium 142  135 - 145 (mEq/L)    Potassium 4.3  3.5 - 5.1 (mEq/L)    Chloride 109  96 - 112 (mEq/L)    CO2 24  19 - 32 (mEq/L)    Glucose, Bld 105 (*) 70 - 99 (mg/dL)    BUN 50 (*) 6 - 23 (mg/dL)    Creatinine, [name] 3.73 (*) 0.50 - 1.10 (mg/dL)    Calcium 8.5  8.4 - 10.5 (mg/dL)    Total Protein 6.0  6.0 - 8.3 (g/dL)    Albumin 2.0 (*) 3.5 - 5.2 (g/dL)    AST 10  0 - 37 (U/L)    ALT 6  0 - 35 (U/L)    Alkaline Phosphatase 44  39 - 117 (U/L)    Total Bilirubin 0.2 (*) 0.3 - 1.2 (mg/dL)    GFR calc non Af Amer 12 (*) >90 (mL/min)    GFR calc Af Amer 14 (*) >90 (mL/min)   HEMOGLOBIN A1C     Status: Abnormal   Collection Time   12/19/10  4:00 AM      Component Value Range Comment   Hemoglobin A1C 6.2 (*) <5.7 (%)    Mean Plasma Glucose 131 (*) <117 (mg/dL)   GLUCOSE, CAPILLARY     Status: Abnormal   Collection Time   12/19/10  4:02 AM      Component Value Range Comment   Glucose-Capillary 107 (*) 70 - 99 (mg/dL)   GLUCOSE, CAPILLARY     Status: Abnormal   Collection Time   12/19/10  8:23 AM      Component Value Range Comment   Glucose-Capillary 121 (*) 70 - 99 (mg/dL)   GLUCOSE,  CAPILLARY     Status: Abnormal   Collection Time   12/19/10 12:40 PM      Component Value Range Comment   Glucose-Capillary 151 (*) 70 - 99 (mg/dL)     Micro Results: Recent Results (from the past 240 hour(s))  WOUND CULTURE     Status: Normal   Collection Time   12/12/10  9:39 AM      Component Value Range Status Comment   GRAM STAIN  Rare   Final    GRAM STAIN WBC present-both PMN and Mononuclear   Final    GRAM STAIN No Squamous Epithelial Cells Seen   Final    GRAM STAIN Abundant Gram Negative Rods   Final    GRAM STAIN Abundant Gram Positive Cocci In Pairs   Final    Organism ID, Bacteria Multiple Organisms Present,None Predominant   Final   SURGICAL PCR SCREEN     Status: Normal   Collection Time   12/18/10  7:15 AM      Component Value Range Status Comment   MRSA, PCR NEGATIVE  NEGATIVE  Final    Staphylococcus aureus NEGATIVE  NEGATIVE  Final   ANAEROBIC CULTURE     Status: Normal (Preliminary result)   Collection Time   12/18/10  9:45 AM      Component Value Range Status Comment   Specimen Description ABDOMEN   Final    Special Requests PATIENT ON FOLLOWING VANC   Final    Gram Stain     Final    Value: NO WBC SEEN     NO SQUAMOUS EPITHELIAL CELLS SEEN     MODERATE GRAM VARIABLE ROD   Culture     Final    Value: NO ANAEROBES ISOLATED; CULTURE IN PROGRESS FOR 5 DAYS   Report Status PENDING   Incomplete   BODY FLUID CULTURE     Status: Normal (Preliminary result)   Collection Time   12/18/10  9:45 AM      Component Value Range Status Comment   Specimen Description ABDOMEN   Final    Special Requests PATIENT ON FOLLOWING VANC   Final    Gram Stain     Final    Value: NO WBC SEEN     FEW GRAM VARIABLE ROD   Culture MODERATE GRAM NEGATIVE RODS   Final    Report Status PENDING   Incomplete     Studies/Results: Chest 2 View  12/18/2010  *RADIOLOGY REPORT*  Clinical Data: Preoperative assessment, hypertension, diabetes, former smoker  CHEST - 2 VIEW  Comparison: 07/02/2010  Findings: Enlargement of cardiac silhouette. Calcified tortuous aorta. Pulmonary vascularity normal. Lungs clear. No pleural effusion or pneumothorax. Bones unremarkable.  IMPRESSION: Enlargement of cardiac silhouette. No acute abnormalities.  Original Report Authenticated By: Burnetta Sabin, M.D.   Medications: Scheduled Meds:   .  amLODipine  10 mg Oral Q0700  . aspirin EC  81 mg Oral Daily  . calcitRIOL  0.25 mcg Oral Daily  . calcium carbonate  1,250 mg Oral Daily  . cloNIDine  0.2 mg Oral Daily  . diphenhydrAMINE  25 mg Oral Once  . doxycycline  100 mg Oral BID  . ferrous sulfate  325 mg Oral Q breakfast  . furosemide  40 mg Intravenous Once  . furosemide  40 mg Oral q1800  . furosemide  80 mg Oral Q breakfast  . heparin subcutaneous  5,000 Units Subcutaneous Q8H  . hydrALAZINE  50 mg Oral Q8H  .  HYDROmorphone      . influenza  inactive virus vaccine  0.5 mL Intramuscular Once  . insulin aspart  0-20 Units Subcutaneous Q4H  . insulin glargine  10 Units Subcutaneous QHS  . isosorbide mononitrate  60 mg Oral Daily  . metoprolol  50 mg Oral Q0700  . metronidazole  500 mg Intravenous Q8H  . pantoprazole (PROTONIX) IV  40 mg Intravenous Q24H  . pneumococcal 23 valent vaccine  0.5 mL Intramuscular Once  . rosuvastatin  20 mg Oral q1800   Continuous Infusions:   . DISCONTD: sodium chloride 75 mL/hr at 12/18/10 1355   PRN Meds:.diphenhydrAMINE, HYDROcodone-acetaminophen, morphine, ondansetron (ZOFRAN) IV, ondansetron, DISCONTD: oxyCODONE-acetaminophen  Assessment/Plan: #1 Ant abdominal wall abscess s/p incision and drainage-mx as per surgery #2 Hx of chf(systolic dysfunction)-pt to continue diuresis #3 Hypertension-Bp stable.pt to continue meds #4 Anemia s/p 2units of prbc transfusion-hgb stable #5 Diabetes Mellitus-continue insulin regimen Patient is medically stable for discharge Will sign-off Thank you for allowing me to participate in the management of this patient.   LOS: 1 day   Willean Schurman 12/19/2010, 4:24 PM

## 2010-12-20 ENCOUNTER — Encounter (HOSPITAL_COMMUNITY): Payer: Self-pay | Admitting: General Surgery

## 2010-12-20 LAB — GLUCOSE, CAPILLARY
Glucose-Capillary: 127 mg/dL — ABNORMAL HIGH (ref 70–99)
Glucose-Capillary: 146 mg/dL — ABNORMAL HIGH (ref 70–99)
Glucose-Capillary: 132 mg/dL — ABNORMAL HIGH (ref 70–99)
Glucose-Capillary: 141 mg/dL — ABNORMAL HIGH (ref 70–99)
Glucose-Capillary: 111 mg/dL — ABNORMAL HIGH (ref 70–99)

## 2010-12-20 LAB — BODY FLUID CULTURE: Gram Stain: NONE SEEN

## 2010-12-20 MED ORDER — HYDROCODONE-ACETAMINOPHEN 5-325 MG PO TABS: 1.0000 | ORAL_TABLET | ORAL | Status: AC | PRN

## 2010-12-20 MED ORDER — METRONIDAZOLE 500 MG PO TABS: 500.0000 mg | ORAL_TABLET | Freq: Three times a day (TID) | ORAL | Status: DC

## 2010-12-20 MED ORDER — PANTOPRAZOLE SODIUM 40 MG PO TBEC
40.0000 mg | DELAYED_RELEASE_TABLET | Freq: Every day | ORAL | Status: DC
  Administered 2010-12-20 – 2010-12-23 (×4): 40 mg via ORAL
  Filled 2010-12-20 (×3): qty 1

## 2010-12-20 MED ORDER — METRONIDAZOLE 500 MG PO TABS
500.0000 mg | ORAL_TABLET | Freq: Three times a day (TID) | ORAL | Status: DC
  Administered 2010-12-20 – 2010-12-21 (×4): 500 mg via ORAL
  Filled 2010-12-20 (×8): qty 1

## 2010-12-20 NOTE — Progress Notes (Addendum)
PHARMACIST - PHYSICIAN COMMUNICATION DR:   Kirke Corin CONCERNING: Medication IV to Oral Route Change Policy  RECOMMENDATION: This patient is receiving pantoprazole by the intravenous route.  Based on criteria approved by the Pharmacy and Therapeutics Committee, the antibiotic(s) is/are being converted to the equivalent oral dose form(s).   DESCRIPTION: These criteria include:  The patient does not exhibit a GI malabsorption state  The patient is eating (either orally or via tube) and/or has been taking other orally administered medications for a least 24 hours  The patient is improving clinically  If you have questions about this conversion, please contact the Pharmacy Department  []   [phone] )  [facility] []   [phone] )  [facility] Pontes  []   [phone] )  [facility] [x]   [phone] )  [place] PharmD  Pager [phone] 12/20/2010 1:19 PM

## 2010-12-20 NOTE — Progress Notes (Signed)
Physical Therapy Evaluation Patient Details Name: [name] MRN: [id number] DOB: [birth year] Today's Date: 12/20/2010 Time: [id number]   Eval I Problem List:  Patient Active Problem List  Diagnoses  . Incisional ventral hernia w obstruction    Past Medical History:  Past Medical History  Diagnosis Date  . Diabetes mellitus   . Hypertension   . CHF (congestive heart failure)   . Arthritis   . Abdominal abscess 12-17-10    abdominal abscesses x2 ? one at this time  . Staph aureus infection November 2012   . Myocardial infarction 12-17-10    80's-abnormal testing showed  . Swelling of both ankles 12-17-10  . Blood transfusion, without reported diagnosis 12-17-10    "thinks in [facility]   Past Surgical History:  Past Surgical History  Procedure Date  . Ventral hernia repair     component separation, repair with biologic  . Vaginal hysterectomy   . Cardiac catheterization 12-17-10    '80's  . Abdominal hysterectomy 12-17-10    Vaginal Hysterectomy  . Cataract extraction w/ intraocular lens implant 12-17-10    bilateral  . Laparotomy 12/18/2010    Procedure: EXPLORATORY LAPAROTOMY;  Surgeon: Rolm Bookbinder, MD;  Location: [facility];  Service: General;  Laterality: N/A;  Abdominal Seroma Evacuation    PT Assessment/Plan/Recommendation PT Assessment Clinical Impression Statement: Pt presents with diagnosis of abdominal wound abscess, s/p I&D and exp lap on 12/18/10. Pt is modified independent with mobility in room and in hallway. Do not feel pt needs HH for physical therapy, but pt will likely need follow-up for assistance with wound care. If possible and if MD approves/agrees-pt may consider outpatient wound care if MD feels appropriate- pt reports prior [facility] wound care was not adequate. Recommend discussion of follow-up care for wound with MD. Recommend continued home health aide assist as well.  PT Recommendation/Assessment: Patent does not need any further PT  services No Skilled PT: Patient is modified independent with all activity/mobility PT Goals     PT Evaluation Precautions/Restrictions    Prior Functioning  Home Living Lives With: Alone Receives Help From: Personal care attendant (5x/week, 3-4 hours a day) Type of Home: House Home Layout: One level Home Access: Stairs to enter Entrance Stairs-Rails: Right;Left Entrance Stairs-Number of Steps: 5 Prior Function Level of Independence: Requires assistive device for independence;Independent with basic ADLs;Needs assistance with homemaking Cognition Cognition Arousal/Alertness: Awake/alert Overall Cognitive Status: Appears within functional limits for tasks assessed Orientation Level: Oriented X4 Sensation/Coordination Coordination Gross Motor Movements are Fluid and Coordinated: Yes Extremity Assessment RLE Assessment RLE Assessment: Within Functional Limits LLE Assessment LLE Assessment: Within Functional Limits Mobility (including Balance) Bed Mobility Bed Mobility: No Transfers Transfers: Yes Sit to Stand: 6: Modified independent (Device/Increase time) Stand to Sit: 6: Modified independent (Device/Increase time) Ambulation/Gait Ambulation/Gait: Yes Ambulation/Gait Assistance: 6: Modified independent (Device/Increase time) Ambulation Distance (Feet): 400 Feet Assistive device: Straight cane Gait velocity: Increased gait speed. VCs for slowing of pace during ambulation  Posture/Postural Control Posture/Postural Control: No significant limitations Exercise    End of Session PT - End of Session Activity Tolerance: Patient tolerated treatment well Patient left: in bed;with call bell in reach General Behavior During Session: [facility] for tasks performed Cognition: [facility] for tasks performed  Weston Anna [facility] 12/20/2010, 11:50 AM

## 2010-12-20 NOTE — Progress Notes (Signed)
CSW spoke with pt and gave bed offers. Pt was interested in [facility]. CSW spoke with Tammy from [facility] and was informed of what documents needed to be faxed for pre-auth. Pt does not need SNF for rehab but for wound care. CSW awaiting on facility to get pre auth. Please note that if pt does not get pre auth today it will not be before Monday so pt will not be able to discharge to the facility over the weekend. CSW will continue to follow the pt and offer support.  Luane School, [place] 12/20/2010 1:13 PM [phone]

## 2010-12-20 NOTE — Progress Notes (Signed)
2 Days Post-Op  Subjective: No complaints, tol diet  Objective: Vital signs in last 24 hours: Temp:  [98.7 F (37.1 C)-99.1 F (37.3 C)] 99.1 F (37.3 C) (11/30 0210) Pulse Rate:  [64-79] 76  (11/30 0210) Resp:  [18] 18  (11/30 0210) BP: (147-179)/(77-94) 168/92 mmHg (11/30 0600) SpO2:  [95 %-99 %] 97 % (11/30 0210) Last BM Date: 12/18/10  Intake/Output from previous day: 11/29 0701 - 11/30 0700 In: 1150 [P.O.:1040; IV Piggyback:110] Out: 3650 [Urine:3650] Intake/Output this shift: Total I/O In: 720 [P.O.:720] Out: 2800 [Urine:2800]  Resp: clear to auscultation bilaterally Cardio: regular rate and rhythm and systolic murmur:  3/6,   GI: soft, nontender, wound dressed and open, no infection  Lab Results:   Basename 12/19/10 0400 12/18/10 0700  WBC 9.5 8.3  HGB 9.0* 7.3*  HCT 28.3* 23.3*  PLT 207 271   BMET  Basename 12/19/10 0400 12/18/10 0700  NA 142 143  K 4.3 4.5  CL 109 110  CO2 24 24  GLUCOSE 105* 143*  BUN 50* 59*  CREATININE 3.73* 4.01*  CALCIUM 8.5 8.4   PT/INR No results found for this basename: LABPROT:2,INR:2 in the last 72 hours ABG No results found for this basename: PHART:2,PCO2:2,PO2:2,HCO3:2 in the last 72 hours  Studies/Results: Chest 2 View  12/18/2010  *RADIOLOGY REPORT*  Clinical Data: Preoperative assessment, hypertension, diabetes, former smoker  CHEST - 2 VIEW  Comparison: 07/02/2010  Findings: Enlargement of cardiac silhouette. Calcified tortuous aorta. Pulmonary vascularity normal. Lungs clear. No pleural effusion or pneumothorax. Bones unremarkable.  IMPRESSION: Enlargement of cardiac silhouette. No acute abnormalities.  Original Report Authenticated By: Burnetta Sabin, M.D.    Anti-infectives: Anti-infectives     Start     Dose/Rate Route Frequency Ordered Stop   12/19/10 0000   doxycycline (VIBRA-TABS) 100 MG tablet        100 mg Oral 2 times daily 12/19/10 0651 12/29/10 2359   12/19/10 0000   metroNIDAZOLE (FLAGYL) 500 MG  tablet        500 mg Oral 3 times daily 12/19/10 0651 12/29/10 2359   12/18/10 1500   doxycycline (VIBRA-TABS) tablet 100 mg        100 mg Oral 2 times daily 12/18/10 1305     12/18/10 1300   metroNIDAZOLE (FLAGYL) IVPB 500 mg        500 mg 100 mL/hr over 60 Minutes Intravenous Every 8 hours 12/18/10 1153     12/18/10 0000   vancomycin (VANCOCIN) 1,500 mg in sodium chloride 0.9 % 500 mL IVPB        1,500 mg 250 mL/hr over 120 Minutes Intravenous 120 min pre-op 12/17/10 2058 12/18/10 [id number]          Assessment/Plan: s/p Procedure(s): EXPLORATORY LAPAROTOMY Discharge when rehab placement complete  LOS: 2 days    [facility] 12/20/2010

## 2010-12-21 LAB — GLUCOSE, CAPILLARY: Glucose-Capillary: 113 mg/dL — ABNORMAL HIGH (ref 70–99)

## 2010-12-21 MED ORDER — INSULIN ASPART 100 UNIT/ML ~~LOC~~ SOLN: 0.0000 [IU] | Freq: Every day | SUBCUTANEOUS | Status: DC

## 2010-12-21 MED ORDER — INSULIN ASPART 100 UNIT/ML ~~LOC~~ SOLN: 0.0000 [IU] | Freq: Three times a day (TID) | SUBCUTANEOUS | Status: DC

## 2010-12-21 MED ORDER — DEXTROSE 5 % IV SOLN
1.0000 g | Freq: Two times a day (BID) | INTRAVENOUS | Status: DC
  Administered 2010-12-21 – 2010-12-23 (×4): 1 g via INTRAVENOUS
  Filled 2010-12-21 (×5): qty 1

## 2010-12-21 MED ORDER — INSULIN GLARGINE 100 UNIT/ML ~~LOC~~ SOLN: 10.0000 [IU] | Freq: Every day | SUBCUTANEOUS | Status: DC

## 2010-12-21 MED ORDER — INSULIN ASPART 100 UNIT/ML ~~LOC~~ SOLN
0.0000 [IU] | Freq: Three times a day (TID) | SUBCUTANEOUS | Status: DC
  Administered 2010-12-22 – 2010-12-23 (×2): 3 [IU] via SUBCUTANEOUS
  Administered 2010-12-23: 2 [IU] via SUBCUTANEOUS

## 2010-12-21 NOTE — Progress Notes (Signed)
3 Days Post-Op  Subjective: The patient is feeling pretty well. Nurses or changing her bandage twice a day.  Wound culture is growing Morganella which is sensitive to some  cephalosporins but not all. Patient is currently on oral Flagyl, and it is not clear that the patient's infection is sensitive to that. We'll switch to cefepime.   Objective: Vital signs in last 24 hours: Temp:  [97.6 F (36.4 C)-99.3 F (37.4 C)] 97.6 F (36.4 C) (12/01 1327) Pulse Rate:  [81-105] 81  (12/01 1327) Resp:  [18-20] 18  (12/01 1327) BP: (155-202)/(78-96) 155/81 mmHg (12/01 1327) SpO2:  [95 %-98 %] 97 % (12/01 1327) Last BM Date: 12/18/10  Intake/Output from previous day: 11/30 0701 - 12/01 0700 In: 1560 [P.O.:1560] Out: 2750 [Urine:2750] Intake/Output this shift: Total I/O In: 240 [P.O.:240] Out: 1275 [Urine:1275]  GI: abdomen is obese. A balloon right lower quadrant is cleaning up with dressing changes. No odor. No bleeding. Minimal necrosis.  Lab Results:  Results for orders placed during the hospital encounter of 12/18/10 (from the past 24 hour(s))  GLUCOSE, CAPILLARY     Status: Abnormal   Collection Time   12/20/10  4:25 PM      Component Value Range   Glucose-Capillary 132 (*) 70 - 99 (mg/dL)  GLUCOSE, CAPILLARY     Status: Abnormal   Collection Time   12/20/10  7:56 PM      Component Value Range   Glucose-Capillary 141 (*) 70 - 99 (mg/dL)  GLUCOSE, CAPILLARY     Status: Abnormal   Collection Time   12/20/10 11:29 PM      Component Value Range   Glucose-Capillary 111 (*) 70 - 99 (mg/dL)  GLUCOSE, CAPILLARY     Status: Abnormal   Collection Time   12/21/10  3:36 AM      Component Value Range   Glucose-Capillary 113 (*) 70 - 99 (mg/dL)  GLUCOSE, CAPILLARY     Status: Abnormal   Collection Time   12/21/10  8:32 AM      Component Value Range   Glucose-Capillary 102 (*) 70 - 99 (mg/dL)  GLUCOSE, CAPILLARY     Status: Abnormal   Collection Time   12/21/10 12:09 PM   Component Value Range   Glucose-Capillary 146 (*) 70 - 99 (mg/dL)     Studies/Results: @[id number] @     . amLODipine  10 mg Oral Q0700  . aspirin EC  81 mg Oral Daily  . calcitRIOL  0.25 mcg Oral Daily  . calcium carbonate  1,250 mg Oral Daily  . ceFEPime (MAXIPIME) IV  1 g Intravenous Q12H  . cloNIDine  0.2 mg Oral Daily  . doxycycline  100 mg Oral BID  . ferrous sulfate  325 mg Oral Q breakfast  . furosemide  40 mg Oral q1800  . furosemide  80 mg Oral Q breakfast  . heparin subcutaneous  5,000 Units Subcutaneous Q8H  . hydrALAZINE  50 mg Oral Q8H  . influenza  inactive virus vaccine  0.5 mL Intramuscular Once  . insulin aspart  0-20 Units Subcutaneous Q4H  . insulin glargine  10 Units Subcutaneous QHS  . isosorbide mononitrate  60 mg Oral Daily  . metoprolol  50 mg Oral Q0700  . pantoprazole  40 mg Oral Q1200  . pneumococcal 23 valent vaccine  0.5 mL Intramuscular Once  . rosuvastatin  20 mg Oral q1800  . DISCONTD: metroNIDAZOLE  500 mg Oral Q8H     Assessment/Plan: s/p Procedure(s): EXPLORATORY LAPAROTOMY  Continue twice a day dressing changes. Switch antibiotics from Flagyl to cefepime IV. The patient is interested in considering discharge home with home health nursing rather than rehabilitation. We'll discuss with Dr. Donne Hazel. Diabetes mellitus. Blood sugar seem under good control. Obesity      LOS: 3 days    Racer Quam M 12/21/2010  . .prob

## 2010-12-22 LAB — BASIC METABOLIC PANEL
CO2: 23 mEq/L (ref 19–32)
Glucose, Bld: 106 mg/dL — ABNORMAL HIGH (ref 70–99)
Potassium: 3.5 mEq/L (ref 3.5–5.1)
Sodium: 142 mEq/L (ref 135–145)

## 2010-12-22 LAB — GLUCOSE, CAPILLARY: Glucose-Capillary: 191 mg/dL — ABNORMAL HIGH (ref 70–99)

## 2010-12-22 MED ORDER — LACTULOSE 10 GM/15ML PO SOLN
20.0000 g | Freq: Once | ORAL | Status: AC
  Administered 2010-12-22: 20 g via ORAL
  Filled 2010-12-22: qty 30

## 2010-12-22 MED ORDER — SODIUM CHLORIDE 0.9 % IV SOLN
INTRAVENOUS | Status: DC
  Administered 2010-12-22: 15:00:00 via INTRAVENOUS

## 2010-12-22 MED ORDER — DOCUSATE SODIUM 100 MG PO CAPS
100.0000 mg | ORAL_CAPSULE | Freq: Two times a day (BID) | ORAL | Status: DC
  Administered 2010-12-22 – 2010-12-23 (×2): 100 mg via ORAL
  Filled 2010-12-22 (×4): qty 1

## 2010-12-22 MED ORDER — VITAMINS A & D EX OINT
TOPICAL_OINTMENT | CUTANEOUS | Status: AC
  Administered 2010-12-22: 03:00:00
  Filled 2010-12-22: qty 5

## 2010-12-22 MED ORDER — POLYETHYLENE GLYCOL 3350 17 G PO PACK: 17.0000 g | PACK | Freq: Every day | ORAL | Status: DC | PRN

## 2010-12-22 NOTE — Progress Notes (Signed)
Patient did not order lunch. Waiting to do CBG before meal. Patient had visitors who had brought food, did not notify staff that she was eating. Reminded patient that staff needed to know before she eats meals.

## 2010-12-22 NOTE — Progress Notes (Signed)
Pt has not had a bowel movement since 11/28.  Pt c/o of constipation.  Pt requesting lactolose.  Notified Dr Lilyan Punt.  Order given to give lactolose 30cc po x one.  Verified with pharmacy per Dr Lilyan Punt that [place] okay to give with elevated creatine of above 4.  Spoke with Caren Griffins pharmacist and verified.  Order placed per md order.

## 2010-12-22 NOTE — Progress Notes (Signed)
Patient is on carb modified diet and tolerating well. Blood sugars ordered for ACHS.  Insulin scheduled for q4h.  Notified Dr Dalbert Batman and order given to change scale and cover ACHS.  Order entered.

## 2010-12-22 NOTE — Discharge Summary (Signed)
Physician Discharge Summary  Patient ID: [name] MRN: [id number] DOB/AGE: 1945/07/25 65 y.o.  Admit date: 12/18/2010 Discharge date: 12/22/2010  Admission Diagnoses: DM HTN CHF Arthritis Abdominal wall abscess s/p ventral hernia repair failing percutaneous drainage CRI  Discharge Diagnoses:  SAA Morganella wound infection  Discharged Condition: good  Hospital Course: This is a 65 year old female with multiple medical problems who presented to me while she was in the hospital with an incarcerated ventral hernia. She was thought not to be an operative candidate to this point but this did not appear to be resolving so I recommended to her laparotomy. I ended up doing an exploratory laparotomy with bowel resection as well as a primary hernia repair. She did fairly well after this but had a long hospital stay. Since then she has had continued abdominal wall seromas. These have been treated percutaneously. The last one showed up again and has been drained but this is still putting out purulent fluid. She is not systemically ill but conservative treatment has not been successful at this point. She does continue to have foul-smelling discharge from this drain. She was then admitted and underwent incision,drainage and debridement of this abdominal wall infection.  This was left open and is undergoing twice daily dressing changes. Her culture has grown out morganella.  She is doing well and will be discharged on antibiotics and dressing changes. Her creatinine is elevated over baseline and I will have her f/u with Dr. Florene Glen. Consults: hospitalists  Significant Diagnostic Studies: microbiology: wound culture: positive for Morganella  Treatments: surgery: incision/drainage and debridement of ab wall abscess  Discharge Exam: Blood pressure 174/80, pulse 93, temperature 98.9 F (37.2 C), temperature source Oral, resp. rate 18, height 5\' 5"  (1.651 m), weight 272 lb (123.378 kg), SpO2  98.00%. General appearance: no distress Resp: clear to auscultation bilaterally Cardio: regular rate and rhythm, S1, S2 normal, no murmur, click, rub or gallop GI: soft, non-tender; bowel sounds normal; no masses,  no organomegaly Incision/Wound:open wound with biologic mesh at base, exudate, no infection Scheduled Meds:   . amLODipine  10 mg Oral Q0700  . aspirin EC  81 mg Oral Daily  . calcitRIOL  0.25 mcg Oral Daily  . calcium carbonate  1,250 mg Oral Daily  . cloNIDine  0.2 mg Oral Daily  . docusate sodium  100 mg Oral BID  . ferrous sulfate  325 mg Oral Q breakfast  . furosemide  40 mg Oral q1800  . furosemide  80 mg Oral Q breakfast  . heparin subcutaneous  5,000 Units Subcutaneous Q8H  . hydrALAZINE  50 mg Oral Q8H  . influenza  inactive virus vaccine  0.5 mL Intramuscular Once  . insulin aspart  0-15 Units Subcutaneous TID WC  . insulin aspart  0-5 Units Subcutaneous QHS  . insulin glargine  10 Units Subcutaneous QHS  . isosorbide mononitrate  60 mg Oral Daily  . lactulose  20 g Oral Once  . metoprolol  50 mg Oral Q0700  . pantoprazole  40 mg Oral Q1200  . pneumococcal 23 valent vaccine  0.5 mL Intramuscular Once  . rosuvastatin  20 mg Oral q1800  . DISCONTD: ceFEPime (MAXIPIME) IV  1 g Intravenous Q12H  . DISCONTD: doxycycline  100 mg Oral BID    Disposition:   Discharge Orders    Future Appointments: Provider: Department: Dept Phone: Center:   12/30/2010 8:30 AM [facility]  None   01/13/2011 8:30 AM [facility]  None      Follow-up Information    Follow up with [facility], MD in 2 weeks.   Contact information:   [employer], [address] [place] [phone]          Signed: Rolm Bookbinder 12/22/2010, 6:17 PM

## 2010-12-22 NOTE — Progress Notes (Addendum)
4 Days Post-Op  Subjective: NO new issues.  She is requesting something to move her bowels.   Objective: Vital signs in last 24 hours: Temp:  [97.6 F (36.4 C)-99.3 F (37.4 C)] 98.1 F (36.7 C) (12/02 0515) Pulse Rate:  [81-105] 95  (12/02 0515) Resp:  [18-20] 18  (12/02 0515) BP: (155-185)/(78-96) 162/78 mmHg (12/02 0515) SpO2:  [93 %-97 %] 93 % (12/02 0515) Last BM Date: 12/18/10  Intake/Output from previous day: 12/01 0701 - 12/02 0700 In: 820 [P.O.:720; IV Piggyback:100] Out: 3175 [Urine:3175] Intake/Output this shift:    General appearance: alert, cooperative and no distress GI: Dressing changed at the bedside today, appears to have biologic mesh at the base of the wound, slight foul smell, several areas of fibrinous material at wound edges but no purulence, and granulation tissue coming in nicely  Lab Results:  No results found for this basename: WBC:2,HGB:2,HCT:2,PLT:2 in the last 72 hours BMET No results found for this basename: NA:2,K:2,CL:2,CO2:2,GLUCOSE:2,BUN:2,CREATININE:2,CALCIUM:2 in the last 72 hours PT/INR No results found for this basename: LABPROT:2,INR:2 in the last 72 hours ABG No results found for this basename: PHART:2,PCO2:2,PO2:2,HCO3:2 in the last 72 hours  Studies/Results: No results found.  Anti-infectives: Anti-infectives     Start     Dose/Rate Route Frequency Ordered Stop   12/21/10 1600   ceFEPIme (MAXIPIME) 1 g in dextrose 5 % 50 mL IVPB     Comments: Ask pharmacy to adjust dose according to BMI.      1 g 100 mL/hr over 30 Minutes Intravenous Every 12 hours 12/21/10 1446     12/20/10 0645   metroNIDAZOLE (FLAGYL) tablet 500 mg  Status:  Discontinued        500 mg Oral 3 times per day 12/20/10 0640 12/21/10 1446   12/20/10 0000   metroNIDAZOLE (FLAGYL) 500 MG tablet        500 mg Oral 3 times daily 12/20/10 0637 12/30/10 2359   12/19/10 0000   doxycycline (VIBRA-TABS) 100 MG tablet        100 mg Oral 2 times daily 12/19/10 0651  12/29/10 2359   12/19/10 0000   metroNIDAZOLE (FLAGYL) 500 MG tablet        500 mg Oral 3 times daily 12/19/10 0651 12/29/10 2359   12/18/10 1500   doxycycline (VIBRA-TABS) tablet 100 mg        100 mg Oral 2 times daily 12/18/10 1305     12/18/10 1300   metroNIDAZOLE (FLAGYL) IVPB 500 mg  Status:  Discontinued        500 mg 100 mL/hr over 60 Minutes Intravenous Every 8 hours 12/18/10 1153 12/20/10 0640   12/18/10 0000   vancomycin (VANCOCIN) 1,500 mg in sodium chloride 0.9 % 500 mL IVPB        1,500 mg 250 mL/hr over 120 Minutes Intravenous 120 min pre-op 12/17/10 2058 12/18/10 [id number]          Assessment/Plan: s/p Procedure(s): EXPLORATORY LAPAROTOMY continue wound care with wet to dry dressings, plan for dispo to home with home health this week.  LOS: 4 days    [name] [name] 12/22/2010 Creatinine elevated, but UOP adequate.  Will repeat BMP today.

## 2010-12-23 LAB — BASIC METABOLIC PANEL
CO2: 22 mEq/L (ref 19–32)
Glucose, Bld: 123 mg/dL — ABNORMAL HIGH (ref 70–99)
Potassium: 3.7 mEq/L (ref 3.5–5.1)
Sodium: 142 mEq/L (ref 135–145)

## 2010-12-23 LAB — ANAEROBIC CULTURE

## 2010-12-23 LAB — GLUCOSE, CAPILLARY: Glucose-Capillary: 199 mg/dL — ABNORMAL HIGH (ref 70–99)

## 2010-12-23 MED ORDER — HYDROCODONE-ACETAMINOPHEN 5-325 MG PO TABS: 1.0000 | ORAL_TABLET | ORAL | Status: AC | PRN

## 2010-12-23 NOTE — Progress Notes (Signed)
5 Days Post-Op  Subjective: No complaints, pain controlled, eating well  Objective: Vital signs in last 24 hours: Temp:  [98.3 F (36.8 C)-98.9 F (37.2 C)] 98.5 F (36.9 C) (12/03 0425) Pulse Rate:  [89-98] 92  (12/03 0425) Resp:  [18-20] 20  (12/03 0425) BP: (146-177)/(79-92) 158/84 mmHg (12/03 0425) SpO2:  [95 %-98 %] 97 % (12/03 0425) Last BM Date: 12/18/10  Intake/Output from previous day: 12/02 0701 - 12/03 0700 In: [id number] [P.O.:840; I.V.:715; IV Piggyback:100] Out: 1250 [Urine:1250] Intake/Output this shift:    General appearance: no distress Resp: clear to auscultation bilaterally Cardio: regular rate and rhythm, S1, S2 normal, no murmur, click, rub or gallop GI: soft, non-tender; bowel sounds normal; no masses,  no organomegaly Incision/Wound: wound open with some exudate, biologic mesh at base, cleaning up well  Lab Results:  No results found for this basename: WBC:2,HGB:2,HCT:2,PLT:2 in the last 72 hours BMET  [facility] 12/23/10 0504 12/22/10 0945  NA 142 142  K 3.7 3.5  CL 108 107  CO2 22 23  GLUCOSE 123* 106*  BUN 58* 52*  CREATININE 4.80* 4.56*  CALCIUM 8.9 9.1   PT/INR No results found for this basename: LABPROT:2,INR:2 in the last 72 hours ABG No results found for this basename: PHART:2,PCO2:2,PO2:2,HCO3:2 in the last 72 hours  Studies/Results: No results found.  Anti-infectives: Anti-infectives     Start     Dose/Rate Route Frequency Ordered Stop   12/21/10 1600   ceFEPIme (MAXIPIME) 1 g in dextrose 5 % 50 mL IVPB  Status:  Discontinued     Comments: Ask pharmacy to adjust dose according to BMI.      1 g 100 mL/hr over 30 Minutes Intravenous Every 12 hours 12/21/10 1446 12/23/10 0645   12/20/10 0645   metroNIDAZOLE (FLAGYL) tablet 500 mg  Status:  Discontinued        500 mg Oral 3 times per day 12/20/10 0640 12/21/10 1446   12/20/10 0000   metroNIDAZOLE (FLAGYL) 500 MG tablet        500 mg Oral 3 times daily 12/20/10 0637 12/30/10 2359   12/19/10 0000   doxycycline (VIBRA-TABS) 100 MG tablet        100 mg Oral 2 times daily 12/19/10 0651 12/29/10 2359   12/19/10 0000   metroNIDAZOLE (FLAGYL) 500 MG tablet        500 mg Oral 3 times daily 12/19/10 0651 12/29/10 2359   12/18/10 1500   doxycycline (VIBRA-TABS) tablet 100 mg  Status:  Discontinued        100 mg Oral 2 times daily 12/18/10 1305 12/23/10 0645   12/18/10 1300   metroNIDAZOLE (FLAGYL) IVPB 500 mg  Status:  Discontinued        500 mg 100 mL/hr over 60 Minutes Intravenous Every 8 hours 12/18/10 1153 12/20/10 0640   12/18/10 0000   vancomycin (VANCOCIN) 1,500 mg in sodium chloride 0.9 % 500 mL IVPB        1,500 mg 250 mL/hr over 120 Minutes Intravenous 120 min pre-op 12/17/10 2058 12/18/10 [id number]          Assessment/Plan: S/p debridement of abdominal wall abscess with open wound CRI  Can dc from my standpoint but will need to go to SNF as she needs twice daily wet to dry dressing changes to her wound to prevent further problems. Will discuss with renal her elevated Cr over her baseline prior to discharge Will stop abx as this is open and clean   LOS:  5 days    [place] 12/23/2010

## 2010-12-23 NOTE — Progress Notes (Signed)
Pt to discharge to [facility]. CSW has faxed discharge clinicals to the facility and Tammy has confirmed they were received. The facility has requested that the pt arrive at the facility after 2pm.Pt is contacting family to see if they can transport her to the facility. CSW has informed care coordinator Clarissa that [place] Foley will complete pts discharge to the facility and she can be reached at [phone]. Farrel Demark N 12/23/2010 1:18 PM [id number]

## 2010-12-24 NOTE — Progress Notes (Signed)
Pt was discharged by CSW Foley.CSW Jimmye Norman had previously faxed clinicals to facility and confirmed they were received.  Pt was transported by family to [facility] for short term rehab. CSW signed off 12-23-10. Luane School 12/24/2010 9:46 AM [id number]

## 2010-12-30 ENCOUNTER — Encounter (HOSPITAL_COMMUNITY)
Admission: RE | Admit: 2010-12-30 | Discharge: 2010-12-30 | Disposition: A | Discharge status: Home / Self Care | Payer: No Typology Code available for payment source | Source: Ambulatory Visit | Attending: Nephrology | Admitting: Nephrology

## 2010-12-30 DIAGNOSIS — D638 Anemia in other chronic diseases classified elsewhere: Secondary | ICD-10-CM | POA: Insufficient documentation

## 2010-12-30 DIAGNOSIS — N183 Chronic kidney disease, stage 3 unspecified: Secondary | ICD-10-CM | POA: Insufficient documentation

## 2010-12-30 MED ORDER — EPOETIN ALFA 20000 UNIT/ML IJ SOLN
INTRAMUSCULAR | Status: AC
  Filled 2010-12-30: qty 1

## 2010-12-30 MED ORDER — EPOETIN ALFA 10000 UNIT/ML IJ SOLN
30000.0000 [IU] | INTRAMUSCULAR | Status: DC
  Administered 2010-12-30: 30000 [IU] via SUBCUTANEOUS

## 2010-12-30 MED ORDER — EPOETIN ALFA 10000 UNIT/ML IJ SOLN
INTRAMUSCULAR | Status: AC
  Filled 2010-12-30: qty 1

## 2011-01-07 ENCOUNTER — Encounter (INDEPENDENT_AMBULATORY_CARE_PROVIDER_SITE_OTHER): Payer: Self-pay | Admitting: General Surgery

## 2011-01-07 ENCOUNTER — Ambulatory Visit (INDEPENDENT_AMBULATORY_CARE_PROVIDER_SITE_OTHER): Payer: Medicare (Managed Care) | Admitting: General Surgery

## 2011-01-07 VITALS — BP 160/90 | HR 72 | Temp 97.8°F | Resp 18 | Ht 65.0 in | Wt 289.0 lb

## 2011-01-07 DIAGNOSIS — IMO0002 Reserved for concepts with insufficient information to code with codable children: Secondary | ICD-10-CM

## 2011-01-07 DIAGNOSIS — Z09 Encounter for follow-up examination after completed treatment for conditions other than malignant neoplasm: Secondary | ICD-10-CM

## 2011-01-07 DIAGNOSIS — K651 Peritoneal abscess: Secondary | ICD-10-CM

## 2011-01-07 NOTE — Progress Notes (Signed)
Subjective:     Patient ID: [name], female   DOB: [birth year], 65 y.o.   MRN: [id number]  HPI This is a 64 her old female who has a previous ventral hernia repair for incarcerated hernia. She's had a lot of trouble with an abdominal wall seroma and infection. I took her back to the operating room a couple of weeks ago and did incision and drainage as well as a debridement of her abdominal wall. Her biologic mesh is visible at the base of this incision. She is now getting dressing changes which are not occurring twice a day as she was being sent to the facility for. She told me that they changes Saturday and did not change it again until Monday. She is otherwise doing well right now.  Review of Systems     Objective:   Physical Exam rlq wound healing with granulation tissue, no infection, at base this is biologic mesh present    Assessment:     S/p vh repair with subsequent ab wall abscess s/p drainage    Plan:        At this point I think is fine for her to go home. She will need home health set up for daily wet to dry dressing changes which I think she can do as well. If this is not able to be performed I think every other day dressing changes also reasonable. This area appears to be healing very well I think it hopefully will be healed over the next several weeks. I will plan on seeing her back in 3-4 weeks.

## 2011-01-07 NOTE — Progress Notes (Signed)
Addended by: Illene Regulus on: 01/07/2011 02:53 PM   Modules accepted: Orders

## 2011-01-13 ENCOUNTER — Encounter (HOSPITAL_COMMUNITY)
Admission: RE | Admit: 2011-01-13 | Discharge: 2011-01-13 | Disposition: A | Discharge status: Home / Self Care | Payer: Medicare (Managed Care) | Source: Ambulatory Visit | Attending: Nephrology | Admitting: Nephrology

## 2011-01-13 LAB — POCT HEMOGLOBIN-HEMACUE: Hemoglobin: 8.7 g/dL — ABNORMAL LOW (ref 12.0–15.0)

## 2011-01-13 MED ORDER — EPOETIN ALFA 20000 UNIT/ML IJ SOLN
INTRAMUSCULAR | Status: AC
  Filled 2011-01-13: qty 1

## 2011-01-13 MED ORDER — EPOETIN ALFA 10000 UNIT/ML IJ SOLN
INTRAMUSCULAR | Status: AC
  Administered 2011-01-13: 30000 [IU] via SUBCUTANEOUS
  Filled 2011-01-13: qty 1

## 2011-01-13 MED ORDER — EPOETIN ALFA 10000 UNIT/ML IJ SOLN
30000.0000 [IU] | INTRAMUSCULAR | Status: DC
  Administered 2011-01-13: 30000 [IU] via SUBCUTANEOUS

## 2011-01-21 HISTORY — PX: INTRAOCULAR PROSTHESES INSERTION: SHX360

## 2011-01-27 ENCOUNTER — Encounter (HOSPITAL_COMMUNITY)
Admission: RE | Admit: 2011-01-27 | Discharge: 2011-01-27 | Disposition: A | Discharge status: Home / Self Care | Payer: Medicare HMO | Source: Ambulatory Visit | Attending: Nephrology | Admitting: Nephrology

## 2011-01-27 DIAGNOSIS — D638 Anemia in other chronic diseases classified elsewhere: Secondary | ICD-10-CM | POA: Insufficient documentation

## 2011-01-27 DIAGNOSIS — N183 Chronic kidney disease, stage 3 unspecified: Secondary | ICD-10-CM | POA: Insufficient documentation

## 2011-01-27 LAB — POCT HEMOGLOBIN-HEMACUE: Hemoglobin: 8.7 g/dL — ABNORMAL LOW (ref 12.0–15.0)

## 2011-01-27 MED ORDER — EPOETIN ALFA 10000 UNIT/ML IJ SOLN
30000.0000 [IU] | INTRAMUSCULAR | Status: DC
  Administered 2011-01-27: 30000 [IU] via SUBCUTANEOUS

## 2011-01-27 MED ORDER — EPOETIN ALFA 20000 UNIT/ML IJ SOLN
INTRAMUSCULAR | Status: AC
  Filled 2011-01-27: qty 1

## 2011-02-06 ENCOUNTER — Ambulatory Visit (INDEPENDENT_AMBULATORY_CARE_PROVIDER_SITE_OTHER): Payer: No Typology Code available for payment source | Admitting: General Surgery

## 2011-02-06 ENCOUNTER — Encounter (INDEPENDENT_AMBULATORY_CARE_PROVIDER_SITE_OTHER): Payer: Self-pay | Admitting: General Surgery

## 2011-02-06 VITALS — BP 144/74 | HR 82 | Resp 18 | Ht 65.0 in | Wt 290.0 lb

## 2011-02-06 DIAGNOSIS — Z09 Encounter for follow-up examination after completed treatment for conditions other than malignant neoplasm: Secondary | ICD-10-CM

## 2011-02-06 NOTE — Progress Notes (Signed)
Subjective:     Patient ID: [name], female   DOB: [birth year], 66 y.o.   MRN: [id number]  HPI This is a 66 through female with multiple medical problems who I know very well from an urgent exploratory laparotomy for a small bowel obstruction from a recurrent ventral hernia. She's a lot of trouble postoperatively with her wound. Her hernia still appears repaired. We drained this a number of times percutaneously. Eventually I took her back to the operating room and debrided her abdominal wall and she's been undergoing dressing changes. She reports his area is somewhat foul smelling now but she otherwise is doing pretty well. This is not getting changed with the frequency that he needs to get changed.  Review of Systems     Objective:   Physical Exam rlq wound granulating, no erythema, I probed the medial aspect and entered a fairly large pocket with purulence    Assessment:     S/p VH repair with ab wall abscess    Plan:     I think the skin was just allowed to close over her wound prior to healing. I packed this area today. I sent instructions to her  facility to continue packing this daily this is just one hopefully have to heal by secondary intention at this point.

## 2011-02-10 ENCOUNTER — Encounter (HOSPITAL_COMMUNITY): Payer: Medicare HMO

## 2011-02-10 ENCOUNTER — Encounter (HOSPITAL_COMMUNITY)
Admission: RE | Admit: 2011-02-10 | Discharge: 2011-02-10 | Disposition: A | Discharge status: Home / Self Care | Payer: Medicare HMO | Source: Ambulatory Visit | Attending: Nephrology | Admitting: Nephrology

## 2011-02-10 MED ORDER — EPOETIN ALFA 10000 UNIT/ML IJ SOLN
INTRAMUSCULAR | Status: AC
  Administered 2011-02-10: 30000 [IU] via SUBCUTANEOUS
  Filled 2011-02-10: qty 1

## 2011-02-10 MED ORDER — EPOETIN ALFA 20000 UNIT/ML IJ SOLN
INTRAMUSCULAR | Status: AC
  Filled 2011-02-10: qty 1

## 2011-02-10 MED ORDER — EPOETIN ALFA 10000 UNIT/ML IJ SOLN
30000.0000 [IU] | INTRAMUSCULAR | Status: DC
  Administered 2011-02-10: 30000 [IU] via SUBCUTANEOUS

## 2011-02-20 ENCOUNTER — Other Ambulatory Visit (HOSPITAL_COMMUNITY): Payer: Self-pay | Admitting: *Deleted

## 2011-02-24 ENCOUNTER — Encounter (HOSPITAL_COMMUNITY): Payer: Medicare HMO

## 2011-02-24 ENCOUNTER — Encounter (INDEPENDENT_AMBULATORY_CARE_PROVIDER_SITE_OTHER): Payer: Self-pay | Admitting: General Surgery

## 2011-02-24 ENCOUNTER — Encounter (HOSPITAL_COMMUNITY)
Admission: RE | Admit: 2011-02-24 | Discharge: 2011-02-24 | Disposition: A | Discharge status: Home / Self Care | Payer: Medicare HMO | Source: Ambulatory Visit | Attending: Nephrology | Admitting: Nephrology

## 2011-02-24 ENCOUNTER — Ambulatory Visit (INDEPENDENT_AMBULATORY_CARE_PROVIDER_SITE_OTHER): Payer: Medicare (Managed Care) | Admitting: General Surgery

## 2011-02-24 ENCOUNTER — Telehealth (INDEPENDENT_AMBULATORY_CARE_PROVIDER_SITE_OTHER): Payer: Self-pay | Admitting: General Surgery

## 2011-02-24 VITALS — BP 142/86 | HR 68 | Temp 97.2°F | Resp 16 | Ht 65.0 in | Wt 296.0 lb

## 2011-02-24 DIAGNOSIS — D638 Anemia in other chronic diseases classified elsewhere: Secondary | ICD-10-CM | POA: Insufficient documentation

## 2011-02-24 DIAGNOSIS — N183 Chronic kidney disease, stage 3 unspecified: Secondary | ICD-10-CM | POA: Insufficient documentation

## 2011-02-24 DIAGNOSIS — Z09 Encounter for follow-up examination after completed treatment for conditions other than malignant neoplasm: Secondary | ICD-10-CM

## 2011-02-24 LAB — RENAL FUNCTION PANEL
Albumin: 2.4 g/dL — ABNORMAL LOW (ref 3.5–5.2)
Chloride: 108 mEq/L (ref 96–112)
GFR calc Af Amer: 11 mL/min — ABNORMAL LOW (ref >90)
Glucose, Bld: 156 mg/dL — ABNORMAL HIGH (ref 70–99)
Phosphorus: 6 mg/dL — ABNORMAL HIGH (ref 2.3–4.6)
Potassium: 3.5 mEq/L (ref 3.5–5.1)
Sodium: 142 mEq/L (ref 135–145)

## 2011-02-24 LAB — IRON AND TIBC
Saturation Ratios: 34 % (ref 20–55)
UIBC: 110 ug/dL — ABNORMAL LOW (ref 125–400)

## 2011-02-24 LAB — POCT HEMOGLOBIN-HEMACUE: Hemoglobin: 9.3 g/dL — ABNORMAL LOW (ref 12.0–15.0)

## 2011-02-24 MED ORDER — EPOETIN ALFA 20000 UNIT/ML IJ SOLN
INTRAMUSCULAR | Status: AC
  Filled 2011-02-24: qty 1

## 2011-02-24 MED ORDER — EPOETIN ALFA 10000 UNIT/ML IJ SOLN
30000.0000 [IU] | INTRAMUSCULAR | Status: DC
  Administered 2011-02-24: 30000 [IU] via SUBCUTANEOUS

## 2011-02-24 MED ORDER — EPOETIN ALFA 10000 UNIT/ML IJ SOLN
INTRAMUSCULAR | Status: AC
  Administered 2011-02-24: 30000 [IU] via SUBCUTANEOUS
  Filled 2011-02-24: qty 1

## 2011-02-24 NOTE — Progress Notes (Signed)
Subjective:     Patient ID: [name], female   DOB: 04/29/1945, 66 y.o.   MRN: [id number]  HPI This is a 66 year old female who I know well from her prior surgery that was complicated by an abdominal wall seroma. I evacuated this and debrided a fair amount of her abdominal wall the left this open. Since then she has been at a facility and this is closed up superficially despite the fact I left the opening. I may now she still has some murky drainage coming from the depth of this is healed too quickly. Currently this is not getting dressed appropriately. She returns today complaining of some drainage but is otherwise doing well without any significant complaints.  Review of Systems     Objective:   Physical Exam  Abdominal:         Assessment:     Open abdominal wound s/p debridement and drainage of chronic seroma    Plan:     This has healed too quickly superficially.  The dressings are not being done at appropriate frequency or correctly.  We will get in touch with her facility and what I would like to do with this. Plan is to do daily dressing changes and pack the cavity under the open wound which is in the medial aspect of the incision with a cotton tip applicator and half inch iodoform gauze.  I will see her back in 1-2 weeks.  I am concerned that if this does not resolve I may need to open this up again but will try to treat conservatively.

## 2011-02-24 NOTE — Patient Instructions (Addendum)
Her abdominal wound needs to packed daily to depth of incision.  This should be done with half inch iodoform and a cotton tip applicator.  I spoke to nurse Anderson Malta today and advised her of the dressing care that needs to be done/ sara

## 2011-02-24 NOTE — Telephone Encounter (Signed)
Patient needs a follow up with Dr Donne Hazel in one to two weeks.  Please arrange with [facility] [phone].

## 2011-03-07 ENCOUNTER — Telehealth (INDEPENDENT_AMBULATORY_CARE_PROVIDER_SITE_OTHER): Payer: Self-pay

## 2011-03-07 ENCOUNTER — Encounter (INDEPENDENT_AMBULATORY_CARE_PROVIDER_SITE_OTHER): Payer: Self-pay | Admitting: General Surgery

## 2011-03-07 ENCOUNTER — Ambulatory Visit (INDEPENDENT_AMBULATORY_CARE_PROVIDER_SITE_OTHER): Payer: No Typology Code available for payment source | Admitting: General Surgery

## 2011-03-07 VITALS — BP 158/106 | HR 88 | Temp 97.4°F | Resp 26 | Ht 65.0 in | Wt 296.0 lb

## 2011-03-07 DIAGNOSIS — S31109A Unspecified open wound of abdominal wall, unspecified quadrant without penetration into peritoneal cavity, initial encounter: Secondary | ICD-10-CM | POA: Insufficient documentation

## 2011-03-07 NOTE — Progress Notes (Signed)
Subjective:     Patient ID: [name], female   DOB: 03/01/1945, 66 y.o.   MRN: [id number]  HPI This is a 48 usual female with multiple medical problems who underwent a complicated ventral hernia repair for an obstruction. She's had a lot of trouble with her wound where she had a long-term seroma that I attempted to drain. I eventually too her  back to the operative room and left this a widely open. She was sent to her facility where this was allowed to close to soon.  She now has a small hole draining foul smelling purulent fluid.  This is being packed daily. She reports no fevers, no change in bowel movements.  Review of Systems     Objective:   Physical Exam 1 cm opening with larger cavity below packed, foul smelling material    Assessment:     Open anterior abdominal wound    Plan:     I am going to send her to get ct.  I would like to ensure this is not a fistula although I don't think so. May need to return to or if there is nothing able to be drained.

## 2011-03-07 NOTE — Patient Instructions (Signed)
Return to office after you get ct scan. If note a fever or any other new complaints please call.

## 2011-03-07 NOTE — Telephone Encounter (Signed)
Called [name] to set up homehealth for Monday when the pt comes home from [facility]. The will need daily dressing changes with plain packing. The pt is scheduled for a CT next week and we will f/u on the pt 03-17-11. Ms [name] requested we fax over notes to [phone] and they will arrange for homehealth to start Monday 03-10-11.

## 2011-03-10 ENCOUNTER — Telehealth (INDEPENDENT_AMBULATORY_CARE_PROVIDER_SITE_OTHER): Payer: Self-pay

## 2011-03-10 ENCOUNTER — Encounter (HOSPITAL_COMMUNITY)
Admission: RE | Admit: 2011-03-10 | Discharge: 2011-03-10 | Disposition: A | Discharge status: Home / Self Care | Payer: Medicare HMO | Source: Ambulatory Visit | Attending: Nephrology | Admitting: Nephrology

## 2011-03-10 ENCOUNTER — Encounter (HOSPITAL_COMMUNITY): Payer: Medicare HMO

## 2011-03-10 MED ORDER — EPOETIN ALFA 10000 UNIT/ML IJ SOLN
INTRAMUSCULAR | Status: AC
  Filled 2011-03-10: qty 1

## 2011-03-10 MED ORDER — EPOETIN ALFA 20000 UNIT/ML IJ SOLN
INTRAMUSCULAR | Status: AC
  Administered 2011-03-10: 30000 [IU] via SUBCUTANEOUS
  Filled 2011-03-10: qty 1

## 2011-03-10 MED ORDER — EPOETIN ALFA 10000 UNIT/ML IJ SOLN: 30000.0000 [IU] | INTRAMUSCULAR | Status: DC

## 2011-03-10 NOTE — Telephone Encounter (Signed)
LMOM with friend to get a hold of pt to call me b/c pt's # has been DC. I need to speak to pt ASAP to find out about her insurance b/c pt has a CT scan set up for 03-12-11.

## 2011-03-11 ENCOUNTER — Telehealth (INDEPENDENT_AMBULATORY_CARE_PROVIDER_SITE_OTHER): Payer: Self-pay

## 2011-03-11 LAB — POCT HEMOGLOBIN-HEMACUE: Hemoglobin: 10 g/dL — ABNORMAL LOW (ref 12.0–15.0)

## 2011-03-11 MED FILL — Epoetin Alfa Inj 10000 Unit/ML: INTRAMUSCULAR | Qty: 1 | Status: AC

## 2011-03-11 NOTE — Telephone Encounter (Signed)
2nd message left for pt's friend to call me or have pt call me today. I need to speak to the pt b/c I need to know her medical insurance info and need copy of the card.

## 2011-03-12 ENCOUNTER — Ambulatory Visit (HOSPITAL_COMMUNITY)
Admission: RE | Admit: 2011-03-12 | Discharge: 2011-03-12 | Disposition: A | Discharge status: Home / Self Care | Payer: Medicare HMO | Source: Ambulatory Visit | Attending: General Surgery | Admitting: General Surgery

## 2011-03-12 DIAGNOSIS — S31109A Unspecified open wound of abdominal wall, unspecified quadrant without penetration into peritoneal cavity, initial encounter: Secondary | ICD-10-CM

## 2011-03-12 DIAGNOSIS — IMO0002 Reserved for concepts with insufficient information to code with codable children: Secondary | ICD-10-CM | POA: Insufficient documentation

## 2011-03-12 DIAGNOSIS — Y849 Medical procedure, unspecified as the cause of abnormal reaction of the patient, or of later complication, without mention of misadventure at the time of the procedure: Secondary | ICD-10-CM | POA: Insufficient documentation

## 2011-03-17 ENCOUNTER — Ambulatory Visit (INDEPENDENT_AMBULATORY_CARE_PROVIDER_SITE_OTHER): Payer: No Typology Code available for payment source | Admitting: General Surgery

## 2011-03-17 ENCOUNTER — Encounter (INDEPENDENT_AMBULATORY_CARE_PROVIDER_SITE_OTHER): Payer: Self-pay | Admitting: General Surgery

## 2011-03-17 VITALS — BP 162/92 | HR 82 | Resp 18 | Ht 65.0 in | Wt 296.0 lb

## 2011-03-17 DIAGNOSIS — S31109A Unspecified open wound of abdominal wall, unspecified quadrant without penetration into peritoneal cavity, initial encounter: Secondary | ICD-10-CM

## 2011-03-17 MED ORDER — HYDROCODONE-ACETAMINOPHEN 10-325 MG PO TABS: 1.0000 | ORAL_TABLET | Freq: Every day | ORAL | Status: DC

## 2011-03-17 NOTE — Progress Notes (Signed)
Patient ID: [name], female   DOB: 1945-08-17, 65 y.o.   MRN: [id number]  Chief Complaint  Patient presents with  . Follow-up    Recheck abdominal wound    HPI [name] is a 66 y.o. female.   HPI This is a 66 year old female who I know very well. I met her after she had had an incarcerated hernia with a bowel obstruction for about a week. I then took her to the operating room and a laparotomy with a primary hernia repair and a biologic mesh. Postoperatively she did well and had a very long recovery period her drains came out and this was complicated by a long-term seroma. This was drained a number of times. It became clear that this was not going to resolve I took her back to the operating room and excised a chronic seroma. After this she went to a nursing facility and  this area was left close up superficially and still left a fairly deep cavity. I been trying to get this to heal but there still is been drainage of some murky looking fluid that has been fairly odorous well. I did a recent CT scan to confirm that there was still a fair amount of fluid. There does not look like there is a connection to the bowel presently it does however look like there is a fair amount of fluid present still that is not getting better. She otherwise reports some pain in this area but no other complaints.  Past Medical History  Diagnosis Date  . Diabetes mellitus   . Hypertension   . CHF (congestive heart failure)   . Arthritis   . Abdominal abscess 12-17-10    abdominal abscesses x2 ? one at this time  . Staph aureus infection November 2012   . Myocardial infarction 12-17-10    80's-abnormal testing showed  . Swelling of both ankles 12-17-10  . Blood transfusion, without reported diagnosis 12-17-10    "thinks in [facility]    Past Surgical History  Procedure Date  . Ventral hernia repair     component separation, repair with biologic  . Vaginal hysterectomy   . Cardiac  catheterization 12-17-10    '80's  . Abdominal hysterectomy 12-17-10    Vaginal Hysterectomy  . Cataract extraction w/ intraocular lens implant 12-17-10    bilateral  . Laparotomy 12/18/2010    Procedure: EXPLORATORY LAPAROTOMY;  Surgeon: Rolm Bookbinder, MD;  Location: [facility] ORS;  Service: General;  Laterality: N/A;  Abdominal Seroma Evacuation    Family History  Problem Relation Age of Onset  . Hypertension Mother   . Cancer Brother     spine  . Cancer Sister     brain  . Hypertension Maternal Grandmother     Social History History  Substance Use Topics  . Smoking status: Former Smoker    Quit date: 12/16/1980  . Smokeless tobacco: Never Used  . Alcohol Use: No    No Known Allergies  Current Outpatient Prescriptions  Medication Sig Dispense Refill  . amLODipine (NORVASC) 10 MG tablet Take 10 mg by mouth every morning.       Marland Kitchen aspirin EC 81 MG tablet Take 81 mg by mouth daily.        . calcitRIOL (ROCALTROL) 0.25 MCG capsule Take 0.25 mcg by mouth daily.        . calcium carbonate 200 MG capsule Take 200 mg by mouth daily.       . cloNIDine (CATAPRES) 0.2  MG tablet Take 0.2 mg by mouth daily.        . ferrous sulfate 325 (65 FE) MG tablet Take 325 mg by mouth daily with breakfast.        . furosemide (LASIX) 80 MG tablet Take 40-80 mg by mouth as directed. Take 1 tablet in the morning and 1/2 tablet in the evening      . insulin glargine (LANTUS) 100 UNIT/ML injection Inject 10 Units into the skin at bedtime.        . INSULIN REGULAR HUMAN IJ Inject 1-9 Units as directed 3 (three) times daily. Per Sliding scale      . isosorbide mononitrate (IMDUR) 60 MG 24 hr tablet Take 60 mg by mouth daily.       . metoprolol (TOPROL-XL) 50 MG 24 hr tablet Take 50 mg by mouth every morning.       . Multiple Vitamin (MULTIVITAMIN PO) Take by mouth daily.        . Nutritional Supplements (GLUCERNA MEAL PO) Take 237 mLs by mouth daily.       . rosuvastatin (CRESTOR) 20 MG tablet Take 20  mg by mouth daily.          Review of Systems Review of Systems  Blood pressure 162/92, pulse 82, resp. rate 18, height 5\' 5"  (1.651 m), weight 296 lb (134.265 kg).  Physical Exam Physical Exam  Vitals reviewed. Constitutional: She appears well-developed and well-nourished.  Neck: Neck supple.  Cardiovascular: Normal rate, regular rhythm and normal heart sounds.   Pulmonary/Chest: Effort normal and breath sounds normal. She has no wheezes. She has no rales.  Abdominal: Soft.    Lymphadenopathy:    She has no cervical adenopathy.    Data Reviewed CT ABDOMEN AND PELVIS WITHOUT CONTRAST  Technique: Multidetector CT imaging of the abdomen and pelvis was  performed following the standard protocol without intravenous  contrast.  Comparison: 11/18/2010  Findings: Postsurgical changes in the anterior abdominal wall with  prior mesh repair. Tiny fat-containing ventral hernia superiorly.  3.3 x 6.9 x 4.2 cm thick-walled gas and fluid collection in the  right anterior abdominal wall (series 2/image 59), with fistulous  communication to the skin surface (series 2/image 61).  Tiny hiatal hernia.  Unenhanced liver, spleen, pancreas, and adrenal glands are within  normal limits.  Gallbladder is unremarkable. No intrahepatic or extrahepatic  ductal dilatation.  Bilateral renal cysts, although incompletely characterized in the  absence of intervenous contrast. No hydronephrosis.  Atherosclerotic calcifications of the abdominal aorta and branch  vessels.  No abdominopelvic ascites.  No suspicious abdominopelvic lymphadenopathy. Small bilateral  inguinal lymph nodes, likely reactive.  Status post hysterectomy. No adnexal masses.  Bladder is within normal limits.  Degenerative changes of the visualized thoracolumbar spine.  IMPRESSION:  Postsurgical changes in the anterior wall from prior ventral hernia  mesh repair.  3.3 x 6.9 x 4.2 cm thick-walled gas and fluid collection in the    right anterior abdominal wall with fistulous communication to the  skin surface.   Assessment   Chronic abdominal wall fluid collection    Plan    This area has unfortunately recurred. I think it healed superficially too quickly. We discussed all of her options I think really the only option is to go back to the operating room and do a debridement of her abdominal wall again. This is not without risk given her medical problems. We discussed again the risks of recurrence, injury, cardiac or pulmonary complications as  well as worsening of her chronic renal failure. We'll plan on doing this as soon as possible.       Redith Drach 03/17/2011, 2:37 PM

## 2011-03-18 ENCOUNTER — Encounter (HOSPITAL_COMMUNITY): Payer: Self-pay | Admitting: Pharmacy Technician

## 2011-03-19 ENCOUNTER — Encounter (HOSPITAL_COMMUNITY)
Admission: RE | Admit: 2011-03-19 | Discharge: 2011-03-19 | Disposition: A | Discharge status: Home / Self Care | Payer: Medicare HMO | Source: Ambulatory Visit | Attending: General Surgery | Admitting: General Surgery

## 2011-03-19 ENCOUNTER — Encounter (HOSPITAL_COMMUNITY): Payer: Self-pay

## 2011-03-19 HISTORY — DX: Cardiac arrhythmia, unspecified: I49.9

## 2011-03-19 HISTORY — DX: Atherosclerotic heart disease of native coronary artery without angina pectoris: I25.10

## 2011-03-19 HISTORY — DX: Anemia, unspecified: D64.9

## 2011-03-19 LAB — CBC
HCT: 31.2 % — ABNORMAL LOW (ref 36.0–46.0)
MCH: 27.7 pg (ref 26.0–34.0)
MCHC: 30.8 g/dL (ref 30.0–36.0)
MCV: 89.9 fL (ref 78.0–100.0)
Platelets: 255 10*3/uL (ref 150–400)
RDW: 15.4 % (ref 11.5–15.5)
WBC: 8.4 10*3/uL (ref 4.0–10.5)

## 2011-03-19 LAB — BASIC METABOLIC PANEL
BUN: 62 mg/dL — ABNORMAL HIGH (ref 6–23)
CO2: 23 mEq/L (ref 19–32)
Calcium: 8.8 mg/dL (ref 8.4–10.5)
Chloride: 109 mEq/L (ref 96–112)
Creatinine, Ser: 4.54 mg/dL — ABNORMAL HIGH (ref 0.50–1.10)
Glucose, Bld: 175 mg/dL — ABNORMAL HIGH (ref 70–99)

## 2011-03-19 MED ORDER — CEFAZOLIN SODIUM-DEXTROSE 2-3 GM-% IV SOLR
2.0000 g | INTRAVENOUS | Status: AC
  Administered 2011-03-20: 2 g via INTRAVENOUS
  Filled 2011-03-19: qty 50

## 2011-03-19 NOTE — Progress Notes (Signed)
Office records requested from cardiologist, Dr Doylene Canard [phone] Sleep Study requested from primary MD, Joelene Millin shelton [phone]

## 2011-03-19 NOTE — Pre-Procedure Instructions (Signed)
[place]  03/19/2011   Your procedure is scheduled on:  Thursday, Feb 28  Report to [facility] at 1200 pM.  Call this number if you have problems the morning of surgery: [phone]   Remember:   Do not eat food:After Midnight.  May have clear liquids: up to 4 Hours before arrival. 8:00 am  Clear liquids include soda, tea, black coffee, apple or grape juice, broth.  Take these medicines the morning of surgery with A SIP OF WATER: *Amlodipine,Clonidine,Hydrocodone,Imdur,Toprol,*Take 25 units of Lantus insulin tonight with bed time snack. No insulin in the morning.*   Do not wear jewelry, make-up or nail polish.  Do not wear lotions, powders, or perfumes. You may wear deodorant.  Do not shave 48 hours prior to surgery.  Do not bring valuables to the hospital.  Contacts, dentures or bridgework may not be worn into surgery.  Leave suitcase in the car. After surgery it may be brought to your room.  For patients admitted to the hospital, checkout time is 11:00 AM the day of discharge.   Patients discharged the day of surgery will not be allowed to drive home.  Name and phone number of your driver: *n/a**  Special Instructions: CHG Shower Use Special Wash: 1/2 bottle night before surgery and 1/2 bottle morning of surgery.   Please read over the following fact sheets that you were given: Pain Booklet, Coughing and Deep Breathing, MRSA Information and Surgical Site Infection Prevention

## 2011-03-20 ENCOUNTER — Encounter (HOSPITAL_COMMUNITY): Payer: Self-pay | Admitting: Certified Registered"

## 2011-03-20 ENCOUNTER — Encounter (HOSPITAL_COMMUNITY): Payer: Self-pay | Admitting: *Deleted

## 2011-03-20 ENCOUNTER — Ambulatory Visit (HOSPITAL_COMMUNITY): Payer: Medicare HMO | Admitting: Certified Registered"

## 2011-03-20 ENCOUNTER — Encounter (HOSPITAL_COMMUNITY)
Admission: RE | Disposition: A | Discharge status: Home / Self Care | Payer: Self-pay | Source: Ambulatory Visit | Attending: General Surgery

## 2011-03-20 ENCOUNTER — Inpatient Hospital Stay (HOSPITAL_COMMUNITY)
Admission: RE | Admit: 2011-03-20 | Discharge: 2011-03-24 | DRG: 857 | Disposition: A | Discharge status: Home / Self Care | Payer: Medicare HMO | Source: Ambulatory Visit | Attending: General Surgery | Admitting: General Surgery

## 2011-03-20 DIAGNOSIS — IMO0002 Reserved for concepts with insufficient information to code with codable children: Secondary | ICD-10-CM

## 2011-03-20 DIAGNOSIS — L03319 Cellulitis of trunk, unspecified: Secondary | ICD-10-CM

## 2011-03-20 DIAGNOSIS — L02219 Cutaneous abscess of trunk, unspecified: Secondary | ICD-10-CM

## 2011-03-20 DIAGNOSIS — I10 Essential (primary) hypertension: Secondary | ICD-10-CM | POA: Diagnosis present

## 2011-03-20 DIAGNOSIS — Z6841 Body Mass Index (BMI) 40.0 and over, adult: Secondary | ICD-10-CM

## 2011-03-20 DIAGNOSIS — I252 Old myocardial infarction: Secondary | ICD-10-CM

## 2011-03-20 DIAGNOSIS — E119 Type 2 diabetes mellitus without complications: Secondary | ICD-10-CM | POA: Diagnosis present

## 2011-03-20 DIAGNOSIS — Z87891 Personal history of nicotine dependence: Secondary | ICD-10-CM

## 2011-03-20 DIAGNOSIS — Z90710 Acquired absence of both cervix and uterus: Secondary | ICD-10-CM

## 2011-03-20 HISTORY — PX: WOUND DEBRIDEMENT: SHX247

## 2011-03-20 LAB — GLUCOSE, CAPILLARY
Glucose-Capillary: 77 mg/dL (ref 70–99)
Glucose-Capillary: 62 mg/dL — ABNORMAL LOW (ref 70–99)
Glucose-Capillary: 72 mg/dL (ref 70–99)
Glucose-Capillary: 67 mg/dL — ABNORMAL LOW (ref 70–99)

## 2011-03-20 SURGERY — DEBRIDEMENT, WOUND, ABDOMEN
Anesthesia: General | Site: Abdomen | Wound class: Dirty or Infected

## 2011-03-20 MED ORDER — FUROSEMIDE 40 MG PO TABS: 40.0000 mg | ORAL_TABLET | Freq: Every day | ORAL | Status: DC

## 2011-03-20 MED ORDER — PROPOFOL 10 MG/ML IV EMUL
INTRAVENOUS | Status: DC | PRN
  Administered 2011-03-20: 200 mg via INTRAVENOUS
  Administered 2011-03-20: 30 mg via INTRAVENOUS

## 2011-03-20 MED ORDER — FERROUS SULFATE 325 (65 FE) MG PO TABS
325.0000 mg | ORAL_TABLET | Freq: Every day | ORAL | Status: DC
  Administered 2011-03-21 – 2011-03-24 (×4): 325 mg via ORAL
  Filled 2011-03-20 (×5): qty 1

## 2011-03-20 MED ORDER — SUFENTANIL CITRATE 50 MCG/ML IV SOLN
INTRAVENOUS | Status: DC | PRN
  Administered 2011-03-20 (×2): 10 ug via INTRAVENOUS

## 2011-03-20 MED ORDER — GLYCOPYRROLATE 0.2 MG/ML IJ SOLN
INTRAMUSCULAR | Status: DC | PRN
  Administered 2011-03-20: .8 mg via INTRAVENOUS

## 2011-03-20 MED ORDER — ASPIRIN EC 81 MG PO TBEC
81.0000 mg | DELAYED_RELEASE_TABLET | Freq: Every day | ORAL | Status: DC
  Administered 2011-03-21 – 2011-03-24 (×4): 81 mg via ORAL
  Filled 2011-03-20 (×4): qty 1

## 2011-03-20 MED ORDER — SODIUM CHLORIDE 0.9 % IV SOLN
INTRAVENOUS | Status: DC
  Administered 2011-03-20: 19:00:00 via INTRAVENOUS

## 2011-03-20 MED ORDER — HYDROMORPHONE HCL PF 1 MG/ML IJ SOLN
0.2500 mg | INTRAMUSCULAR | Status: DC | PRN
  Administered 2011-03-20 (×2): 0.5 mg via INTRAVENOUS

## 2011-03-20 MED ORDER — HYDROCODONE-ACETAMINOPHEN 10-325 MG PO TABS
1.0000 | ORAL_TABLET | Freq: Every day | ORAL | Status: DC
  Administered 2011-03-20: 1 via ORAL
  Filled 2011-03-20 (×2): qty 1

## 2011-03-20 MED ORDER — MUPIROCIN 2 % EX OINT
TOPICAL_OINTMENT | Freq: Two times a day (BID) | CUTANEOUS | Status: DC
  Administered 2011-03-20: 1 via NASAL

## 2011-03-20 MED ORDER — INSULIN ASPART 100 UNIT/ML ~~LOC~~ SOLN
0.0000 [IU] | Freq: Three times a day (TID) | SUBCUTANEOUS | Status: DC
  Administered 2011-03-22 – 2011-03-24 (×3): 3 [IU] via SUBCUTANEOUS
  Filled 2011-03-20: qty 3

## 2011-03-20 MED ORDER — ACETAMINOPHEN 650 MG RE SUPP: 650.0000 mg | Freq: Four times a day (QID) | RECTAL | Status: DC | PRN

## 2011-03-20 MED ORDER — SODIUM CHLORIDE 0.9 % IV SOLN
INTRAVENOUS | Status: DC | PRN
  Administered 2011-03-20: 15:00:00 via INTRAVENOUS

## 2011-03-20 MED ORDER — ONDANSETRON HCL 4 MG/2ML IJ SOLN: 4.0000 mg | Freq: Once | INTRAMUSCULAR | Status: DC | PRN

## 2011-03-20 MED ORDER — MORPHINE SULFATE 2 MG/ML IJ SOLN: 0.0500 mg/kg | INTRAMUSCULAR | Status: DC | PRN

## 2011-03-20 MED ORDER — ADULT MULTIVITAMIN W/MINERALS CH
1.0000 | ORAL_TABLET | Freq: Every day | ORAL | Status: DC
  Administered 2011-03-20 – 2011-03-24 (×5): 1 via ORAL
  Filled 2011-03-20 (×5): qty 1

## 2011-03-20 MED ORDER — CALCIUM CARBONATE 200 MG PO CAPS: 200.0000 mg | ORAL_CAPSULE | Freq: Every day | ORAL | Status: DC

## 2011-03-20 MED ORDER — CLONIDINE HCL 0.2 MG PO TABS
0.2000 mg | ORAL_TABLET | Freq: Every day | ORAL | Status: DC
  Administered 2011-03-21 – 2011-03-24 (×4): 0.2 mg via ORAL
  Filled 2011-03-20 (×4): qty 1

## 2011-03-20 MED ORDER — MIDAZOLAM HCL 5 MG/5ML IJ SOLN
INTRAMUSCULAR | Status: DC | PRN
  Administered 2011-03-20: 1 mg via INTRAVENOUS

## 2011-03-20 MED ORDER — DEXTROSE 50 % IV SOLN
25.0000 mL | Freq: Once | INTRAVENOUS | Status: AC
  Administered 2011-03-20: 25 mL via INTRAVENOUS

## 2011-03-20 MED ORDER — FUROSEMIDE 40 MG PO TABS
40.0000 mg | ORAL_TABLET | Freq: Every day | ORAL | Status: DC
  Administered 2011-03-20 – 2011-03-24 (×5): 40 mg via ORAL
  Filled 2011-03-20 (×5): qty 1

## 2011-03-20 MED ORDER — SUCCINYLCHOLINE CHLORIDE 20 MG/ML IJ SOLN
INTRAMUSCULAR | Status: DC | PRN
  Administered 2011-03-20: 100 mg via INTRAVENOUS

## 2011-03-20 MED ORDER — FUROSEMIDE 80 MG PO TABS
80.0000 mg | ORAL_TABLET | Freq: Every day | ORAL | Status: DC
  Administered 2011-03-21 – 2011-03-24 (×4): 80 mg via ORAL
  Filled 2011-03-20 (×5): qty 1

## 2011-03-20 MED ORDER — DEXTROSE 50 % IV SOLN: 25.0000 mL | Freq: Once | INTRAVENOUS | Status: DC

## 2011-03-20 MED ORDER — ONDANSETRON HCL 4 MG/2ML IJ SOLN: 4.0000 mg | Freq: Four times a day (QID) | INTRAMUSCULAR | Status: DC | PRN

## 2011-03-20 MED ORDER — SODIUM CHLORIDE 0.9 % IV SOLN
INTRAVENOUS | Status: DC
  Administered 2011-03-20: 14:00:00 via INTRAVENOUS

## 2011-03-20 MED ORDER — LACTATED RINGERS IV SOLN
INTRAVENOUS | Status: DC | PRN
  Administered 2011-03-20: 14:00:00 via INTRAVENOUS

## 2011-03-20 MED ORDER — MEPERIDINE HCL 25 MG/ML IJ SOLN: 6.2500 mg | INTRAMUSCULAR | Status: DC | PRN

## 2011-03-20 MED ORDER — ISOSORBIDE MONONITRATE ER 60 MG PO TB24
60.0000 mg | ORAL_TABLET | Freq: Every day | ORAL | Status: DC
  Administered 2011-03-20 – 2011-03-24 (×5): 60 mg via ORAL
  Filled 2011-03-20 (×5): qty 1

## 2011-03-20 MED ORDER — NEOSTIGMINE METHYLSULFATE 1 MG/ML IJ SOLN
INTRAMUSCULAR | Status: DC | PRN
  Administered 2011-03-20: 5 mg via INTRAVENOUS

## 2011-03-20 MED ORDER — ROCURONIUM BROMIDE 100 MG/10ML IV SOLN
INTRAVENOUS | Status: DC | PRN
  Administered 2011-03-20: 50 mg via INTRAVENOUS

## 2011-03-20 MED ORDER — DOCUSATE SODIUM 100 MG PO CAPS
100.0000 mg | ORAL_CAPSULE | Freq: Two times a day (BID) | ORAL | Status: DC
  Administered 2011-03-20 – 2011-03-24 (×8): 100 mg via ORAL
  Filled 2011-03-20 (×9): qty 1

## 2011-03-20 MED ORDER — MORPHINE SULFATE 2 MG/ML IJ SOLN
2.0000 mg | INTRAMUSCULAR | Status: DC | PRN
  Administered 2011-03-20 – 2011-03-24 (×14): 2 mg via INTRAVENOUS
  Filled 2011-03-20 (×14): qty 1

## 2011-03-20 MED ORDER — MUPIROCIN 2 % EX OINT
TOPICAL_OINTMENT | CUTANEOUS | Status: AC
  Filled 2011-03-20: qty 22

## 2011-03-20 MED ORDER — CALCIUM CARBONATE 1250 (500 CA) MG PO TABS
0.5000 | ORAL_TABLET | Freq: Every day | ORAL | Status: DC
  Administered 2011-03-20 – 2011-03-21 (×2): 250 mg via ORAL
  Administered 2011-03-22: 500 mg via ORAL
  Administered 2011-03-23 – 2011-03-24 (×2): 250 mg via ORAL
  Filled 2011-03-20 (×6): qty 0.5

## 2011-03-20 MED ORDER — CALCITRIOL 0.25 MCG PO CAPS
0.2500 ug | ORAL_CAPSULE | Freq: Every day | ORAL | Status: DC
  Administered 2011-03-20 – 2011-03-24 (×5): 0.25 ug via ORAL
  Filled 2011-03-20 (×5): qty 1

## 2011-03-20 MED ORDER — FUROSEMIDE 40 MG PO TABS: 80.0000 mg | ORAL_TABLET | Freq: Every day | ORAL | Status: DC

## 2011-03-20 MED ORDER — AMLODIPINE BESYLATE 10 MG PO TABS
10.0000 mg | ORAL_TABLET | Freq: Every day | ORAL | Status: DC
  Administered 2011-03-20 – 2011-03-24 (×5): 10 mg via ORAL
  Filled 2011-03-20 (×5): qty 1

## 2011-03-20 MED ORDER — DEXTROSE 50 % IV SOLN
12.5000 g | Freq: Once | INTRAVENOUS | Status: AC
  Administered 2011-03-20: 12.5 g via INTRAVENOUS
  Filled 2011-03-20: qty 50

## 2011-03-20 MED ORDER — ACETAMINOPHEN 325 MG PO TABS: 650.0000 mg | ORAL_TABLET | Freq: Four times a day (QID) | ORAL | Status: DC | PRN

## 2011-03-20 MED ORDER — METOPROLOL SUCCINATE ER 50 MG PO TB24
50.0000 mg | ORAL_TABLET | Freq: Every day | ORAL | Status: DC
  Administered 2011-03-20 – 2011-03-24 (×5): 50 mg via ORAL
  Filled 2011-03-20 (×5): qty 1

## 2011-03-20 MED ORDER — ONDANSETRON HCL 4 MG/2ML IJ SOLN
INTRAMUSCULAR | Status: DC | PRN
  Administered 2011-03-20: 4 mg via INTRAVENOUS

## 2011-03-20 MED ORDER — ATORVASTATIN CALCIUM 10 MG PO TABS
20.0000 mg | ORAL_TABLET | Freq: Every day | ORAL | Status: DC
  Administered 2011-03-20 – 2011-03-24 (×5): 20 mg via ORAL
  Filled 2011-03-20 (×5): qty 2

## 2011-03-20 SURGICAL SUPPLY — 28 items
CANISTER SUCTION 2500CC (MISCELLANEOUS) ×2 IMPLANT
CANISTER WOUND CARE 500ML ATS (WOUND CARE) ×2 IMPLANT
CLOTH BEACON ORANGE TIMEOUT ST (SAFETY) ×2 IMPLANT
COVER SURGICAL LIGHT HANDLE (MISCELLANEOUS) ×2 IMPLANT
DRAPE LAPAROSCOPIC ABDOMINAL (DRAPES) ×2 IMPLANT
DRSG VAC ATS LRG SENSATRAC (GAUZE/BANDAGES/DRESSINGS) ×2 IMPLANT
DURAPREP 26ML APPLICATOR (WOUND CARE) IMPLANT
ELECT REM PT RETURN 9FT ADLT (ELECTROSURGICAL) ×2
ELECTRODE REM PT RTRN 9FT ADLT (ELECTROSURGICAL) ×1 IMPLANT
GLOVE BIO SURGEON STRL SZ7 (GLOVE) ×2 IMPLANT
GLOVE BIOGEL PI IND STRL 7.5 (GLOVE) ×1 IMPLANT
GLOVE BIOGEL PI INDICATOR 7.5 (GLOVE) ×1
GOWN STRL NON-REIN LRG LVL3 (GOWN DISPOSABLE) ×4 IMPLANT
KIT BASIN OR (CUSTOM PROCEDURE TRAY) ×2 IMPLANT
KIT ROOM TURNOVER OR (KITS) ×2 IMPLANT
LOOP VESSEL MAXI BLUE (MISCELLANEOUS) IMPLANT
NS IRRIG 1000ML POUR BTL (IV SOLUTION) ×2 IMPLANT
PACK GENERAL/GYN (CUSTOM PROCEDURE TRAY) ×2 IMPLANT
PAD ARMBOARD 7.5X6 YLW CONV (MISCELLANEOUS) ×4 IMPLANT
PAD NEG PRESSURE SENSATRAC (MISCELLANEOUS) ×4 IMPLANT
SPONGE ABDOMINAL VAC ABTHERA (MISCELLANEOUS) ×2 IMPLANT
STAPLER VISISTAT 35W (STAPLE) ×2 IMPLANT
SUCTION POOLE TIP (SUCTIONS) IMPLANT
SWAB COLLECTION DEVICE MRSA (MISCELLANEOUS) ×2 IMPLANT
TOWEL OR 17X24 6PK STRL BLUE (TOWEL DISPOSABLE) ×2 IMPLANT
TOWEL OR 17X26 10 PK STRL BLUE (TOWEL DISPOSABLE) ×2 IMPLANT
TUBE ANAEROBIC SPECIMEN COL (MISCELLANEOUS) ×2 IMPLANT
WATER STERILE IRR 1000ML POUR (IV SOLUTION) IMPLANT

## 2011-03-20 NOTE — H&P (View-Only) (Signed)
Patient ID: Myrtis Ser, female   DOB: [birth year], 66 y.o.   MRN: [id number]  Chief Complaint  Patient presents with  . Follow-up    Recheck abdominal wound    HPI YOSHIE THIBAUT is a 66 y.o. female.   HPI This is a 66 year old female who I know very well. I met her after she had had an incarcerated hernia with a bowel obstruction for about a week. I then took her to the operating room and a laparotomy with a primary hernia repair and a biologic mesh. Postoperatively she did well and had a very long recovery period her drains came out and this was complicated by a long-term seroma. This was drained a number of times. It became clear that this was not going to resolve I took her back to the operating room and excised a chronic seroma. After this she went to a nursing facility and  this area was left close up superficially and still left a fairly deep cavity. I been trying to get this to heal but there still is been drainage of some murky looking fluid that has been fairly odorous well. I did a recent CT scan to confirm that there was still a fair amount of fluid. There does not look like there is a connection to the bowel presently it does however look like there is a fair amount of fluid present still that is not getting better. She otherwise reports some pain in this area but no other complaints.  Past Medical History  Diagnosis Date  . Diabetes mellitus   . Hypertension   . CHF (congestive heart failure)   . Arthritis   . Abdominal abscess 12-17-10    abdominal abscesses x2 ? one at this time  . Staph aureus infection November 2012   . Myocardial infarction 12-17-10    80's-abnormal testing showed  . Swelling of both ankles 12-17-10  . Blood transfusion, without reported diagnosis 12-17-10    "thinks in [facility]    Past Surgical History  Procedure Date  . Ventral hernia repair     component separation, repair with biologic  . Vaginal hysterectomy   . Cardiac  catheterization 12-17-10    '80's  . Abdominal hysterectomy 12-17-10    Vaginal Hysterectomy  . Cataract extraction w/ intraocular lens implant 12-17-10    bilateral  . Laparotomy 12/18/2010    Procedure: EXPLORATORY LAPAROTOMY;  Surgeon: Rolm Bookbinder, MD;  Location: [facility]S;  Service: General;  Laterality: N/A;  Abdominal Seroma Evacuation    Family History  Problem Relation Age of Onset  . Hypertension Mother   . Cancer Brother     spine  . Cancer Sister     brain  . Hypertension Maternal Grandmother     Social History History  Substance Use Topics  . Smoking status: Former Smoker    Quit date: 12/16/1980  . Smokeless tobacco: Never Used  . Alcohol Use: No    No Known Allergies  Current Outpatient Prescriptions  Medication Sig Dispense Refill  . amLODipine (NORVASC) 10 MG tablet Take 10 mg by mouth every morning.       Marland Kitchen aspirin EC 81 MG tablet Take 81 mg by mouth daily.        . calcitRIOL (ROCALTROL) 0.25 MCG capsule Take 0.25 mcg by mouth daily.        . calcium carbonate 200 MG capsule Take 200 mg by mouth daily.       . cloNIDine (CATAPRES) 0.2  MG tablet Take 0.2 mg by mouth daily.        . ferrous sulfate 325 (65 FE) MG tablet Take 325 mg by mouth daily with breakfast.        . furosemide (LASIX) 80 MG tablet Take 40-80 mg by mouth as directed. Take 1 tablet in the morning and 1/2 tablet in the evening      . insulin glargine (LANTUS) 100 UNIT/ML injection Inject 10 Units into the skin at bedtime.        . INSULIN REGULAR HUMAN IJ Inject 1-9 Units as directed 3 (three) times daily. Per Sliding scale      . isosorbide mononitrate (IMDUR) 60 MG 24 hr tablet Take 60 mg by mouth daily.       . metoprolol (TOPROL-XL) 50 MG 24 hr tablet Take 50 mg by mouth every morning.       . Multiple Vitamin (MULTIVITAMIN PO) Take by mouth daily.        . Nutritional Supplements (GLUCERNA MEAL PO) Take 237 mLs by mouth daily.       . rosuvastatin (CRESTOR) 20 MG tablet Take 20  mg by mouth daily.          Review of Systems Review of Systems  Blood pressure 162/92, pulse 82, resp. rate 18, height 5\' 5"  (1.651 m), weight 296 lb (134.265 kg).  Physical Exam Physical Exam  Vitals reviewed. Constitutional: She appears well-developed and well-nourished.  Neck: Neck supple.  Cardiovascular: Normal rate, regular rhythm and normal heart sounds.   Pulmonary/Chest: Effort normal and breath sounds normal. She has no wheezes. She has no rales.  Abdominal: Soft.    Lymphadenopathy:    She has no cervical adenopathy.    Data Reviewed CT ABDOMEN AND PELVIS WITHOUT CONTRAST  Technique: Multidetector CT imaging of the abdomen and pelvis was  performed following the standard protocol without intravenous  contrast.  Comparison: 11/18/2010  Findings: Postsurgical changes in the anterior abdominal wall with  prior mesh repair. Tiny fat-containing ventral hernia superiorly.  3.3 x 6.9 x 4.2 cm thick-walled gas and fluid collection in the  right anterior abdominal wall (series 2/image 59), with fistulous  communication to the skin surface (series 2/image 61).  Tiny hiatal hernia.  Unenhanced liver, spleen, pancreas, and adrenal glands are within  normal limits.  Gallbladder is unremarkable. No intrahepatic or extrahepatic  ductal dilatation.  Bilateral renal cysts, although incompletely characterized in the  absence of intervenous contrast. No hydronephrosis.  Atherosclerotic calcifications of the abdominal aorta and branch  vessels.  No abdominopelvic ascites.  No suspicious abdominopelvic lymphadenopathy. Small bilateral  inguinal lymph nodes, likely reactive.  Status post hysterectomy. No adnexal masses.  Bladder is within normal limits.  Degenerative changes of the visualized thoracolumbar spine.  IMPRESSION:  Postsurgical changes in the anterior wall from prior ventral hernia  mesh repair.  3.3 x 6.9 x 4.2 cm thick-walled gas and fluid collection in the    right anterior abdominal wall with fistulous communication to the  skin surface.   Assessment   Chronic abdominal wall fluid collection    Plan    This area has unfortunately recurred. I think it healed superficially too quickly. We discussed all of her options I think really the only option is to go back to the operating room and do a debridement of her abdominal wall again. This is not without risk given her medical problems. We discussed again the risks of recurrence, injury, cardiac or pulmonary complications as  well as worsening of her chronic renal failure. We'll plan on doing this as soon as possible.       Chayton Murata 03/17/2011, 2:37 PM

## 2011-03-20 NOTE — Anesthesia Preprocedure Evaluation (Addendum)
Anesthesia Evaluation  Patient identified by MRN, date of birth, ID band Patient awake    Reviewed: Allergy & Precautions, H&P , NPO status , Patient's Chart, lab work & pertinent test results, reviewed documented beta blocker date and time   Airway Mallampati: I TM Distance: >3 FB Neck ROM: Full    Dental  (+) Teeth Intact and Edentulous Upper   Pulmonary sleep apnea, Continuous Positive Airway Pressure Ventilation and Oxygen sleep apnea ,  clear to auscultation        Cardiovascular hypertension, Pt. on medications and Pt. on home beta blockers + CAD, + Past MI and +CHF Regular Normal    Neuro/Psych    GI/Hepatic   Endo/Other  Diabetes mellitus-, Well Controlled, Type 2  Renal/GU      Musculoskeletal   Abdominal   Peds  Hematology   Anesthesia Other Findings Upper denture out to labelled cup to daughter in waiting area.  Reproductive/Obstetrics                         Anesthesia Physical Anesthesia Plan  ASA: IV  Anesthesia Plan: General   Post-op Pain Management:    Induction: Intravenous  Airway Management Planned: Oral ETT  Additional Equipment:   Intra-op Plan:   Post-operative Plan: Extubation in OR  Informed Consent: I have reviewed the patients History and Physical, chart, labs and discussed the procedure including the risks, benefits and alternatives for the proposed anesthesia with the patient or authorized representative who has indicated his/her understanding and acceptance.   Dental advisory given  Plan Discussed with: CRNA, Anesthesiologist and Surgeon  Anesthesia Plan Comments:         Anesthesia Quick Evaluation

## 2011-03-20 NOTE — Progress Notes (Signed)
Pt reports after being discharged from [facility] she has not been able to get on a correct schedule with getting her medications filled and is running out of medications before the month is over. Requested case management consult regarding same.

## 2011-03-20 NOTE — Progress Notes (Signed)
Report to Carney Living, RN for primary caregiver.

## 2011-03-20 NOTE — Anesthesia Postprocedure Evaluation (Signed)
  Anesthesia Post-op Note  Patient: [name]  Procedure(s) Performed: Procedure(s) (LRB): DEBRIDEMENT ABDOMINAL WOUND (N/A)  Patient Location: PACU  Anesthesia Type: General  Level of Consciousness: awake and alert   Airway and Oxygen Therapy: Patient Spontanous Breathing and Patient connected to nasal cannula oxygen  Post-op Pain: mild  Post-op Assessment: Post-op Vital signs reviewed, Patient's Cardiovascular Status Stable, Respiratory Function Stable, Patent Airway, No signs of Nausea or vomiting and Pain level controlled  Post-op Vital Signs: Reviewed and stable  Complications: No apparent anesthesia complications

## 2011-03-20 NOTE — Progress Notes (Signed)
Dr. Al Corpus informed of patient's CBG of 42; ordered 25cc of D50 amp.

## 2011-03-20 NOTE — Progress Notes (Signed)
Dr. Al Corpus notified of bottle of water intake at 1100. Held beta blocker d/t heart rate. Started Mupirocin d/t pt reports not getting prescription filled.

## 2011-03-20 NOTE — Interval H&P Note (Signed)
History and Physical Interval Note:  03/20/2011 1:58 PM  [name]  has presented today for surgery, with the diagnosis of chronic infection and fluid  The various methods of treatment have been discussed with the patient and family. After consideration of risks, benefits and other options for treatment, the patient has consented to  Procedure(s) (LRB): DEBRIDEMENT ABDOMINAL WOUND (N/A) as a surgical intervention .  The patients' history has been reviewed, patient examined, no change in status, stable for surgery.  I have reviewed the patients' chart and labs.  Questions were answered to the patient's satisfaction.     Shamya Macfadden

## 2011-03-20 NOTE — Progress Notes (Signed)
Pt received from OR.  Pt alert and oriented.  Oriented to room and unit.  Telemetry placed, IV access patent, VSS. Will continue to monitor

## 2011-03-20 NOTE — Preoperative (Signed)
Beta Blockers   Reason not to administer Beta Blockers: Pt states she did not take Toprol this morning.  To cover with IV if needed in OR.

## 2011-03-20 NOTE — Op Note (Addendum)
Preoperative diagnosis: Chronic abdominal wall seroma, infected Postoperative diagnosis: Same as above Procedure: Incision and debridement of chronic abdominal wall infection, 8" x 4" of skin subcutaneous tissue and old biologic mesh, drainage of chronic abscess cavity Surgeon: Dr. Serita Grammes Asst.: Dr. Elwin Mocha Anesthesia: Gen. Estimated blood loss: 100 cc Specimens: Abdominal wall and cultures of fluid collection Complications: None Drains: vac sponge to open wound Sponge and needle count correct x2 and of operation Disposition to recovery in stable condition  Indications: This is a 66 year old female with multiple comorbidities who I know from an urgent exploratory laparotomy for incarcerated ventral hernia with a bowel obstruction. I explanted a fair amount of some old mesh at that time. I then repaired her primarily with a piece of onlay biologic mesh. Since then she's had numerous issues with her chronic abdominal wall seroma that has been percutaneously drained. I took her to the operating room several months ago to try to drain this completely. Unfortunately the hole on top is healed leaving a cavity below and she now has what appears to be purulent fluid coming from this. I obtained a CT scan and it does not appear that she has a fistula. This appears to be an abdominal wall fluid collection I think we'll not heal unless we excised this and debride her abdominal wall.  Procedure: After informed consent was obtained the patient was taken to the operating room. She was administered antibiotics intravenously. Sequential compression devices were placed on lower extremities prior to induction with anesthesia. She was then placed under general anesthesia without complication. Her abdomen was then prepped and draped in the standard sterile surgical fashion. A surgical timeout was performed.  I made a large elliptical incision encompassing the tract and removed a fair amount of her  abdominal wall. I then removed skin, subcutaneous tissue, and some old mesh. As I took this down it became clear that her prior primary repair appeared to be intact. There was some murky fluid present in this cavity. There was a well epithelialized cavity that I think had incorporated my onlay biologic mesh as well. I debrided all of this that I could without going in and doing a laparotomy. This was all healthy tissue upon completion. I think this it will heal this way. The next thing would unfortunately be to do a complete laparotomy with excision of a fair amount of her abdominal wall I don't think that she needs that currently. Hopefully this will heal as it is. I then irrigated copiously. I obtained hemostasis. I placed a vac sponge and secured this. This was functional upon completion. She tolerated this was extubated and transferred to recovery in stable condition.

## 2011-03-20 NOTE — Transfer of Care (Signed)
Immediate Anesthesia Transfer of Care Note  Patient: [name]  Procedure(s) Performed: Procedure(s) (LRB): DEBRIDEMENT ABDOMINAL WOUND (N/A)  Patient Location: PACU  Anesthesia Type: General  Level of Consciousness: awake, alert  and patient cooperative  Airway & Oxygen Therapy: Patient Spontanous Breathing and Patient connected to face mask oxygen  Post-op Assessment: Report given to PACU RN, Post -op Vital signs reviewed and stable, Patient moving all extremities and Patient able to stick tongue midline  Post vital signs: Reviewed and stable  Complications: No apparent anesthesia complications

## 2011-03-20 NOTE — Anesthesia Procedure Notes (Signed)
Procedure Name: Intubation Date/Time: 03/20/2011 2:48 PM Performed by: Williemae Area Pre-anesthesia Checklist: Patient identified, Emergency Drugs available, Suction available and Patient being monitored Patient Re-evaluated:Patient Re-evaluated prior to inductionOxygen Delivery Method: Circle system utilized Preoxygenation: Pre-oxygenation with 100% oxygen Intubation Type: IV induction Ventilation: Mask ventilation without difficulty Laryngoscope Size: Mac and 3 Grade View: Grade I Tube type: Oral Tube size: 7.5 mm Number of attempts: 1 Airway Equipment and Method: Stylet Placement Confirmation: ETT inserted through vocal cords under direct vision,  positive ETCO2 and breath sounds checked- equal and bilateral Secured at: 21 (at upper gum) cm Tube secured with: Tape Dental Injury: Teeth and Oropharynx as per pre-operative assessment

## 2011-03-21 LAB — BASIC METABOLIC PANEL
BUN: 56 mg/dL — ABNORMAL HIGH (ref 6–23)
CO2: 23 mEq/L (ref 19–32)
Chloride: 113 mEq/L — ABNORMAL HIGH (ref 96–112)
Creatinine, Ser: 4.45 mg/dL — ABNORMAL HIGH (ref 0.50–1.10)
Glucose, Bld: 74 mg/dL (ref 70–99)

## 2011-03-21 LAB — CBC
HCT: 27.7 % — ABNORMAL LOW (ref 36.0–46.0)
MCH: 27.7 pg (ref 26.0–34.0)
MCHC: 30.7 g/dL (ref 30.0–36.0)
MCV: 90.2 fL (ref 78.0–100.0)
RDW: 15.2 % (ref 11.5–15.5)

## 2011-03-21 LAB — GLUCOSE, CAPILLARY: Glucose-Capillary: 122 mg/dL — ABNORMAL HIGH (ref 70–99)

## 2011-03-21 LAB — POCT I-STAT GLUCOSE
Glucose, Bld: 73 mg/dL (ref 70–99)
Operator id: 151301

## 2011-03-21 MED ORDER — HYDROCODONE-ACETAMINOPHEN 10-325 MG PO TABS
1.0000 | ORAL_TABLET | ORAL | Status: DC | PRN
  Administered 2011-03-21 – 2011-03-24 (×10): 1 via ORAL
  Filled 2011-03-21 (×10): qty 1

## 2011-03-21 NOTE — Progress Notes (Signed)
Pt planned for d/c home with VAC, potentially 03/24/2011.  VAC application completed and awaiting MD signature. [facility] to provide [facility] services per pt choice--she has used them in the past. VAC will be provided by [facility].  UR of chart completed.

## 2011-03-21 NOTE — Progress Notes (Signed)
Agree with above 

## 2011-03-21 NOTE — Progress Notes (Signed)
1 Day Post-Op  Subjective: Pt ok. Some pain as expected, but controlled well. No N/V Had BM yesterday.  Objective: Vital signs in last 24 hours: Temp:  [97 F (36.1 C)-99.1 F (37.3 C)] 99.1 F (37.3 C) (03/01 0800) Pulse Rate:  [53-74] 68  (03/01 0800) Resp:  [12-25] 20  (03/01 0800) BP: (142-185)/(61-97) 142/75 mmHg (03/01 0800) SpO2:  [96 %-100 %] 96 % (03/01 0800) Weight:  [134.7 kg (296 lb 15.4 oz)] 134.7 kg (296 lb 15.4 oz) (03/01 0500) Last BM Date: 03/20/11  Intake/Output this shift:    Physical Exam: BP 142/75  Pulse 68  Temp(Src) 99.1 F (37.3 C) (Oral)  Resp 20  Ht 5\' 5"  (1.651 m)  Wt 134.7 kg (296 lb 15.4 oz)  BMI 49.42 kg/m2  SpO2 96% Abdomen: soft, ND Wound Vac intact, SS output in canister.  Labs: CBC  Basename 03/21/11 0645 03/19/11 1333  WBC 8.5 8.4  HGB 8.5* 9.6*  HCT 27.7* 31.2*  PLT 223 255   BMET  Basename 03/21/11 0645 03/19/11 1333  NA 144 142  K 4.4 4.3  CL 113* 109  CO2 23 23  GLUCOSE 74 175*  BUN 56* 62*  CREATININE 4.45* 4.54*  CALCIUM 8.6 8.8   LFT No results found for this basename: PROT,ALBUMIN,AST,ALT,ALKPHOS,BILITOT,BILIDIR,IBILI,LIPASE in the last 72 hours PT/INR No results found for this basename: LABPROT:2,INR:2 in the last 72 hours ABG No results found for this basename: PHART:2,PCO2:2,PO2:2,HCO3:2 in the last 72 hours  Studies/Results: No results found.  Assessment: Chronic abdominal wall seroma, infected.  Procedure(s): DEBRIDEMENT ABDOMINAL WOUND  Plan: Advance diet Encourage activity Home Wd Vac planning  LOS: 1 day    Kandis Fantasia 03/21/2011 9:17 AM

## 2011-03-21 NOTE — Clinical Documentation Improvement (Signed)
BMI DOCUMENTATION CLARIFICATION QUERY  THIS DOCUMENT IS NOT A PERMANENT PART OF THE MEDICAL RECORD  TO RESPOND TO THE THIS QUERY, FOLLOW THE INSTRUCTIONS BELOW:  1. If needed, update documentation for the patient's encounter via the notes activity.  2. Access this query again and click edit on the In [employer].  3. After updating, or not, click F2 to complete all highlighted (required) fields concerning your review. Select "additional documentation in the medical record" OR "no additional documentation provided".  4. Click Sign note button.  5. The deficiency will fall out of your In Basket *Please let us know if you are not able to complete this workflow by phone or e-mail (listed below).         03/21/11  Dear Dr. Donne Hazel  [name]  In an effort to better capture your patient's severity of illness, reflect appropriate length of stay and utilization of resources, a review of the patient medical record has revealed the following indicators.  THANK YOU FOR RESPONDING TO THIS QUERY.  Possible Clinical conditions - Morbid obesity=   BMI =/> 40.0 - Other condition (please specify) - Cannot clinically determine  Supporting Information - Patient's BMI= 50 per doc flowsheet 2/27   Reviewed: additional documentation in the medical record  Thank You,  Ezekiel Ina RN Clinical Documentation Specialist: CELL:  [place]

## 2011-03-22 ENCOUNTER — Encounter (HOSPITAL_COMMUNITY): Payer: Self-pay | Admitting: General Surgery

## 2011-03-22 LAB — GLUCOSE, CAPILLARY
Glucose-Capillary: 124 mg/dL — ABNORMAL HIGH (ref 70–99)
Glucose-Capillary: 131 mg/dL — ABNORMAL HIGH (ref 70–99)

## 2011-03-22 MED ORDER — DIPHENHYDRAMINE HCL 25 MG PO CAPS
25.0000 mg | ORAL_CAPSULE | ORAL | Status: DC | PRN
  Administered 2011-03-22 – 2011-03-23 (×2): 25 mg via ORAL
  Filled 2011-03-22 (×2): qty 1

## 2011-03-22 NOTE — Progress Notes (Signed)
Patient ID: Myrtis Ser, female   DOB: 02-20-1945, 67 y.o.   MRN: [id number]  General Surgery - [facility] - Progress Note  POD# 2  Subjective: Patient up at bedside.  "sore".  Tolerating diet.  Objective: Vital signs in last 24 hours: Temp:  [97.9 F (36.6 C)-99.1 F (37.3 C)] 99.1 F (37.3 C) (03/02 0936) Pulse Rate:  [61-74] 74  (03/02 0936) Resp:  [16-20] 18  (03/02 0936) BP: (142-175)/(65-88) 166/81 mmHg (03/02 0936) SpO2:  [93 %-98 %] 93 % (03/02 0936) Weight:  [303 lb 9.6 oz (137.712 kg)] 303 lb 9.6 oz (137.712 kg) (03/02 0400) Last BM Date: 03/20/11  Intake/Output from previous day: 03/01 0701 - 03/02 0700 In: 1095 [P.O.:720; I.V.:375] Out: 1250 [Urine:1250]  Exam: HEENT - clear, not icteric Abd - VAC intact; no drainage, good seal Ext - no significant edema Neuro - grossly intact, no focal deficits  Lab Results:   Basename 03/21/11 0645 03/19/11 1333  WBC 8.5 8.4  HGB 8.5* 9.6*  HCT 27.7* 31.2*  PLT 223 255     Basename 03/21/11 0645 03/20/11 1537 03/19/11 1333  NA 144 -- 142  K 4.4 -- 4.3  CL 113* -- 109  CO2 23 -- 23  GLUCOSE 74 73 --  BUN 56* -- 62*  CREATININE 4.45* -- 4.54*  CALCIUM 8.6 -- 8.8    Studies/Results: No results found.  Assessment: Status post complex wound debridement and VAC dressing placement  Plan: Check culture results when available Continue VAC dressing Plan dressing change by [place] on Monday, then discharge home with [facility] Pain control  Earnstine Regal, MD, [facility] Office: [phone]  03/22/2011

## 2011-03-23 LAB — WOUND CULTURE: Gram Stain: NONE SEEN

## 2011-03-23 LAB — GLUCOSE, CAPILLARY
Glucose-Capillary: 98 mg/dL (ref 70–99)
Glucose-Capillary: 96 mg/dL (ref 70–99)

## 2011-03-23 MED ORDER — POLYETHYLENE GLYCOL 3350 17 G PO PACK
17.0000 g | PACK | Freq: Once | ORAL | Status: AC
  Administered 2011-03-23: 17 g via ORAL
  Filled 2011-03-23: qty 1

## 2011-03-23 NOTE — Progress Notes (Signed)
Patient pain managed with 1 vicodin at 1132 and 1727 as well as 2 mg morphine IV at 0823 and 1430. Patient pain managed down to 5 out of 10. Patient CBGs controlled with no coverage to day. Patient given mirilax this evening at 1730 with no results at this time. Dr. Harlow Asa notified of morganella in wound culture- no new orders received. Wound vac intact- tried to contact wound nurse this evening with no call back. Passed on to next shift. No new complaints noted.

## 2011-03-23 NOTE — Progress Notes (Signed)
Patient ID: [name], female   DOB: [birth year], 66 y.o.   MRN: [id number]  General Surgery - [facility] - Progress Note  POD# 3  Subjective: Patient up at bedside.  Mild pain.  Ambulating.  No drainage or leakage from dressings.  Objective: Vital signs in last 24 hours: Temp:  [98.3 F (36.8 C)-99.1 F (37.3 C)] 98.7 F (37.1 C) (03/03 0800) Pulse Rate:  [63-76] 76  (03/03 0800) Resp:  [18-20] 20  (03/03 0800) BP: (147-172)/(75-93) 172/75 mmHg (03/03 0800) SpO2:  [93 %-98 %] 95 % (03/03 0800) Weight:  [301 lb 13 oz (136.9 kg)] 301 lb 13 oz (136.9 kg) (03/03 0500) Last BM Date: 03/20/11  Intake/Output from previous day: 03/02 0701 - 03/03 0700 In: 720 [P.O.:720] Out: -   Exam: HEENT - clear, not icteric Abd - soft, obese; BS present; VAC dressing intact on suction Ext - no significant edema Neuro - grossly intact, no focal deficits  Lab Results:   [facility] 03/21/11 0645  WBC 8.5  HGB 8.5*  HCT 27.7*  PLT 223     Basename 03/21/11 0645 03/20/11 1537  NA 144 --  K 4.4 --  CL 113* --  CO2 23 --  GLUCOSE 74 73  BUN 56* --  CREATININE 4.45* --  CALCIUM 8.6 --    Studies/Results: No results found.  Assessment: Complex abdominal wound with VAC dressing in place  Plan: Continue VAC dressing today. Will ask [place] nurse to assess in AM, change VAC, and assist with arrangements for [facility] Pain control Will follow  Earnstine Regal, MD, [facility] Office: [phone]  03/23/2011

## 2011-03-24 ENCOUNTER — Other Ambulatory Visit (INDEPENDENT_AMBULATORY_CARE_PROVIDER_SITE_OTHER): Payer: Self-pay | Admitting: General Surgery

## 2011-03-24 ENCOUNTER — Encounter (HOSPITAL_COMMUNITY): Payer: No Typology Code available for payment source

## 2011-03-24 ENCOUNTER — Telehealth (INDEPENDENT_AMBULATORY_CARE_PROVIDER_SITE_OTHER): Payer: Self-pay | Admitting: General Surgery

## 2011-03-24 ENCOUNTER — Encounter (HOSPITAL_COMMUNITY): Payer: Self-pay | Admitting: Emergency Medicine

## 2011-03-24 ENCOUNTER — Emergency Department (HOSPITAL_COMMUNITY)
Admission: EM | Admit: 2011-03-24 | Discharge: 2011-03-25 | Disposition: A | Discharge status: Home Health | Payer: Medicare HMO | Attending: Emergency Medicine | Admitting: Emergency Medicine

## 2011-03-24 DIAGNOSIS — N189 Chronic kidney disease, unspecified: Secondary | ICD-10-CM | POA: Insufficient documentation

## 2011-03-24 DIAGNOSIS — Z7982 Long term (current) use of aspirin: Secondary | ICD-10-CM | POA: Insufficient documentation

## 2011-03-24 DIAGNOSIS — I129 Hypertensive chronic kidney disease with stage 1 through stage 4 chronic kidney disease, or unspecified chronic kidney disease: Secondary | ICD-10-CM | POA: Insufficient documentation

## 2011-03-24 DIAGNOSIS — T148XXA Other injury of unspecified body region, initial encounter: Secondary | ICD-10-CM

## 2011-03-24 DIAGNOSIS — I509 Heart failure, unspecified: Secondary | ICD-10-CM | POA: Insufficient documentation

## 2011-03-24 DIAGNOSIS — R10819 Abdominal tenderness, unspecified site: Secondary | ICD-10-CM | POA: Insufficient documentation

## 2011-03-24 DIAGNOSIS — T07XXXA Unspecified multiple injuries, initial encounter: Secondary | ICD-10-CM | POA: Insufficient documentation

## 2011-03-24 DIAGNOSIS — Z79899 Other long term (current) drug therapy: Secondary | ICD-10-CM | POA: Insufficient documentation

## 2011-03-24 DIAGNOSIS — M129 Arthropathy, unspecified: Secondary | ICD-10-CM | POA: Insufficient documentation

## 2011-03-24 DIAGNOSIS — Y849 Medical procedure, unspecified as the cause of abnormal reaction of the patient, or of later complication, without mention of misadventure at the time of the procedure: Secondary | ICD-10-CM | POA: Insufficient documentation

## 2011-03-24 DIAGNOSIS — Z794 Long term (current) use of insulin: Secondary | ICD-10-CM | POA: Insufficient documentation

## 2011-03-24 DIAGNOSIS — I2510 Atherosclerotic heart disease of native coronary artery without angina pectoris: Secondary | ICD-10-CM | POA: Insufficient documentation

## 2011-03-24 DIAGNOSIS — I252 Old myocardial infarction: Secondary | ICD-10-CM | POA: Insufficient documentation

## 2011-03-24 DIAGNOSIS — Z09 Encounter for follow-up examination after completed treatment for conditions other than malignant neoplasm: Secondary | ICD-10-CM

## 2011-03-24 DIAGNOSIS — E119 Type 2 diabetes mellitus without complications: Secondary | ICD-10-CM | POA: Insufficient documentation

## 2011-03-24 LAB — GLUCOSE, CAPILLARY
Glucose-Capillary: 91 mg/dL (ref 70–99)
Glucose-Capillary: 139 mg/dL — ABNORMAL HIGH (ref 70–99)

## 2011-03-24 MED ORDER — HYDROCODONE-ACETAMINOPHEN 10-325 MG PO TABS: 1.0000 | ORAL_TABLET | Freq: Four times a day (QID) | ORAL | Status: DC | PRN

## 2011-03-24 NOTE — Progress Notes (Signed)
Wound vac dressing reinforced.

## 2011-03-24 NOTE — Progress Notes (Signed)
John with PCP office called back and gave auth number for [facility] as [id number].  Called [employer] and gave them this number. They stated they would be calling back with delivery info as soon as possible.   Called [facility] to confirm that they would be accepting pt referral for home and they said they would. Faxing the orders to attn: Kim at [phone].

## 2011-03-24 NOTE — Discharge Instructions (Signed)
[facility]      [facility] [phone]  OPEN ABDOMINAL SURGERY: POST OP INSTRUCTIONS  Always review your discharge instruction sheet given to you by the facility where your surgery was performed.  IF YOU HAVE DISABILITY OR FAMILY LEAVE FORMS, YOU MUST BRING THEM TO THE OFFICE FOR PROCESSING.  PLEASE DO NOT GIVE THEM TO YOUR DOCTOR.  1. A prescription for pain medication may be given to you upon discharge.  Take your pain medication as prescribed, if needed.  If narcotic pain medicine is not needed, then you may take acetaminophen (Tylenol) or ibuprofen (Advil) as needed. 2. Take your usually prescribed medications unless otherwise directed. 3. If you need a refill on your pain medication, please contact your pharmacy. They will contact our office to request authorization.  Prescriptions will not be filled after 5pm or on week-ends. 4. You should follow a light diet the first few days after arrival home, such as soup and crackers, pudding, etc.unless your doctor has advised otherwise. A high-fiber, low fat diet can be resumed as tolerated.   Be sure to include lots of fluids daily. Most patients will experience some swelling and bruising on the chest and neck area.  Ice packs will help.  Swelling and bruising can take several days to resolve 5. Most patients will experience some swelling and bruising in the area of the incision. Ice pack will help. Swelling and bruising can take several days to resolve..  6. It is common to experience some constipation if taking pain medication after surgery.  Increasing fluid intake and taking a stool softener will usually help or prevent this problem from occurring.  A mild laxative (Milk of Magnesia or Miralax) should be taken according to package directions if there are no bowel movements after 48 hours. 7.  You may have steri-strips (small skin tapes) in place directly over the incision.  These strips should be left on the skin for 7-10 days.  If your  surgeon used skin glue on the incision, you may shower in 24 hours.  The glue will flake off over the next 2-3 weeks.  Any sutures or staples will be removed at the office during your follow-up visit. You may find that a light gauze bandage over your incision may keep your staples from being rubbed or pulled. You may shower and replace the bandage daily. 8. ACTIVITIES:  You may resume regular (light) daily activities beginning the next day--such as daily self-care, walking, climbing stairs--gradually increasing activities as tolerated.  You may have sexual intercourse when it is comfortable.  Refrain from any heavy lifting or straining until approved by your doctor. a. You may drive when you no longer are taking prescription pain medication, you can comfortably wear a seatbelt, and you can safely maneuver your car and apply brakes b. Return to Work: ___________________________________ 9. You should see your doctor in the office for a follow-up appointment approximately two weeks after your surgery.  Make sure that you call for this appointment within a day or two after you arrive home to insure a convenient appointment time. OTHER INSTRUCTIONS:  _____________________________________________________________ _____________________________________________________________  WHEN TO CALL YOUR DOCTOR: 1. Fever over 101.0 2. Inability to urinate 3. Nausea and/or vomiting 4. Extreme swelling or bruising 5. Continued bleeding from incision. 6. Increased pain, redness, or drainage from the incision. 7. Difficulty swallowing or breathing 8. Muscle cramping or spasms. 9. Numbness or tingling in hands or feet or around lips.  The clinic staff is available to   answer your questions during regular business hours.  Please don't hesitate to call and ask to speak to one of the nurses if you have concerns.  For further questions, please visit [email or web address]   

## 2011-03-24 NOTE — Discharge Summary (Signed)
Physician Discharge Summary  Patient ID: [name] MRN: [id number] DOB/AGE: September 28, 1945 66 y.o.  Admit date: 03/20/2011 Discharge date: 03/24/2011  Admission Diagnoses: Chronic infected seroma s/p ventral hernia repair Discharge Diagnoses:  Active Problems:  * No active hospital problems. *    Discharged Condition: good  Hospital Course: 68 yof who underwent an open ventral hernia repair for an incarcerated ventral hernia with a bowel obstruction previously. She has multiple medical problems and has had a lot of difficulty with a chronic abdominal wall seroma. This has been drained several times percutaneously as well as drained by me in the operating room. This healed up and she still is at what appears to be infected fluid draining from this. She and I discussed returning to the operating room to debride this entire area of her abdominal wall a remove this chronic cavity. This was done which she tolerated very well. I placed a VAC sponge over this. Postoperatively she has had return of bowel function and her pain has been controlled. Her laboratory values were essentially what they were preoperatively. She has had no other complaints in the hospital. She underwent a VAC change today and her wound looks very good. She is going to be discharged today with twice weekly VAC dressing changes and I will follow her up in 2 weeks.  Consults: None  Significant Diagnostic Studies: none  Treatments: surgery: abdominal wall debridement and vac placement  Discharge Exam: Blood pressure 159/78, pulse 75, temperature 98.9 F (37.2 C), temperature source Oral, resp. rate 18, height 5\' 5"  (1.651 m), weight 298 lb 8.1 oz (135.4 kg), SpO2 97.00%. Incision/Wound:clean with pink tissue present, no infection  Disposition: home  Discharge Orders    Future Appointments: Provider: Department: Dept Phone: Center:   04/07/2011 8:00 AM [facility]  None   04/21/2011 8:30 AM  [facility]  None     Medication List  As of 03/24/2011 12:13 PM   TAKE these medications         amLODipine 10 MG tablet   Commonly known as: NORVASC   Take 10 mg by mouth every morning.      aspirin EC 81 MG tablet   Take 81 mg by mouth daily.      calcitRIOL 0.25 MCG capsule   Commonly known as: ROCALTROL   Take 0.25 mcg by mouth daily.      calcium carbonate 200 MG capsule   Take 200 mg by mouth daily.      cloNIDine 0.2 MG tablet   Commonly known as: CATAPRES   Take 0.2 mg by mouth daily.      ferrous sulfate 325 (65 FE) MG tablet   Take 325 mg by mouth daily with breakfast.      furosemide 80 MG tablet   Commonly known as: LASIX   Take 40-80 mg by mouth daily. Take 80mg  (1 tablet) every morning and take 40mg  ( tablet) every evening.      GLUCERNA MEAL PO   Take 237 mLs by mouth daily.      HYDROcodone-acetaminophen 10-325 MG per tablet   Commonly known as: NORCO   Take 1 tablet by mouth at bedtime.      HYDROcodone-acetaminophen 10-325 MG per tablet   Commonly known as: NORCO   Take 1-2 tablets by mouth every 6 (six) hours as needed.      insulin glargine 100 UNIT/ML injection   Commonly known as: LANTUS  Inject 10 Units into the skin at bedtime.      INSULIN REGULAR HUMAN IJ   Inject 1-9 Units as directed 3 (three) times daily. Per Sliding scale      isosorbide mononitrate 60 MG 24 hr tablet   Commonly known as: IMDUR   Take 60 mg by mouth daily.      metoprolol succinate 50 MG 24 hr tablet   Commonly known as: TOPROL-XL   Take 50 mg by mouth every morning.      mulitivitamin with minerals Tabs   Take 1 tablet by mouth daily.      mupirocin ointment 2 %   Commonly known as: BACTROBAN   Apply 1 application topically 2 (two) times daily. Apply to bilateral nares for positive PCR      rosuvastatin 20 MG tablet   Commonly known as: CRESTOR   Take 20 mg by mouth daily.           Follow-up Information    Follow  up with [facility], MD. Schedule an appointment as soon as possible for a visit in 2 weeks.   Contact information:   [employer], [place] [phone]          Signed: Rolm Bookbinder 03/24/2011, 12:13 PM

## 2011-03-24 NOTE — Telephone Encounter (Signed)
Pt discharged from the hospital today with wound vac. Apparently, the [facility] had a leak just prior to discharge home. The nurse reinforced the dressing and no leak was detected. Caregiver states that the wound vac is alarming saying leak. All the tubing is connected but the sponge is not suctioning. Pt has no wound care supplies at home.   Told her to turn vac off for tonight. Dr Cristal Generous nurse will call [facility] in the am and let them know the [facility] is malfunctioning.

## 2011-03-24 NOTE — Progress Notes (Addendum)
4 Days Post-Op  Subjective: No nausea/vomiting, passing flatus etc, no complaints  Objective: Vital signs in last 24 hours: Temp:  [97.6 F (36.4 C)-99.3 F (37.4 C)] 98.9 F (37.2 C) (03/04 0500) Pulse Rate:  [64-76] 74  (03/04 0500) Resp:  [18-20] 18  (03/04 0500) BP: (154-172)/(75-80) 160/76 mmHg (03/04 0500) SpO2:  [95 %-97 %] 97 % (03/04 0500) Weight:  [298 lb 8.1 oz (135.4 kg)] 298 lb 8.1 oz (135.4 kg) (03/04 0500) Last BM Date: 03/20/11  Intake/Output from previous day: 03/03 0701 - 03/04 0700 In: 840 [P.O.:840] Out: -  Intake/Output this shift:    General appearance: no distress GI: soft, vac in place   Assessment/Plan: POD #4 ab wall debridement, vac placement Morbid obesity  Vac not changed over weekend now POD 4 Vac changed today which she tolerated well, will discharge home with vac therapy when set up.  LOS: 4 days    [facility] 03/24/2011

## 2011-03-24 NOTE — ED Notes (Signed)
PT. REPORTS WOUND VAC TUBE ACCIDENTALLY PULLED OUT AT [place] ( [phone]) TODAY . DRESSING INTACT NO DRAINAGE.

## 2011-03-24 NOTE — Consult Note (Signed)
WOC consult Note Reason for Consult: Consult requested for abd vac dressing change.  Dr Donne Hazel at bedside to assess wound.  Pain meds given prior to procedure and pt tolerated with mod discomfort during procedure. Wound type: Full thickness post-op wound Measurement: 9X26X7cm Wound bed: 80% red, 20% yellow Drainage (amount, consistency, odor)  Large amt dark brown-red liquid in cannister. Dressing procedure/placement/frequency: Mepitel and one piece black sponge applied to 125cm con't suction. Pt plans to d/c soon with home vac machine when arranged.   Julien Girt, RN, MSN, [employer]  [phone]

## 2011-03-24 NOTE — Progress Notes (Signed)
Pt planned for discharge today. [employer] says that in order to get approval from [facility] to release the [facility], the PCP must call in a referral. Confirmed with [employer] that it must be PCP and not the surgeon and explained to them that the PCP is not involved in this admit and doesn't know the case.  Called PCP office, Dr. Willey Blade, [phone].  Spoke with Jenny Reichmann, the RN, and gave him the info.  He is calling [employer] and going to call me back with the reference number.

## 2011-03-25 ENCOUNTER — Telehealth (INDEPENDENT_AMBULATORY_CARE_PROVIDER_SITE_OTHER): Payer: Self-pay

## 2011-03-25 LAB — ANAEROBIC CULTURE

## 2011-03-25 NOTE — Telephone Encounter (Signed)
LMOM to call me so I can find out about her wound vac.

## 2011-03-25 NOTE — Discharge Instructions (Signed)
You were seen for complications of your wound and wound VAC system. This was adjusted and seems to be working appropriately at this time. Your providers discuss with you options for any additional treatments at this time you have requested return home to followup with [name]. Please call his office later today for close followup and reevaluation of your wound. If you develop any worsening symptoms or other complications please return to the emergency room.   RESOURCE GUIDE  Dental Problems  Patients with Medicaid: [place] [facility] Phone:  [phone]                                                  Phone:  [phone]  If unable to pay or uninsured, contact:  [facility] or [facility]. to become qualified for the adult dental clinic.  Chronic Pain Problems Contact [facility]  [phone] Patients need to be referred by their primary care doctor.  Insufficient Money for Medicine Contact [employer]:  call "211" or [place] [phone].  No Primary Care Doctor Call [facility]  [phone] Other agencies that provide inexpensive medical care    [place]  [phone]    [facility]  [place]  [phone]    [facility]  [phone]    [facility]  [place]  [place]  [phone] [place]   [phone] (emergency services [phone])  Substance Abuse [facility]  [phone] [facility] [phone] [facility] [place] [phone] [place]inita Pester [phone] Residential & Outpatient Substance Abuse Program  [phone]  Abuse/Neglect [place] [phone] [place] [phone] (After Hours)  Emergency [place] [phone]  [place] [facility] [place] [phone] [place] [phone]  MRSA Hotline #:   (319) 709-1936    [facility] [place] Dept. [address]                       [address]      [address] [place]  [facility] Phone:  [phone]                                   Phone:  [phone]                 Phone:  [place] Phone:  [place] [phone] (After Hours)

## 2011-03-25 NOTE — Telephone Encounter (Signed)
Pt returned my call. The pt ended up going to the ER last night about her wound vac b/c she didn't want to wait till the am to talk to [place]. The pt was having trouble with the suction of the vac. So the ER put more suction on the vac. Last night. The pt seems to be doing ok today with the vac. The homehealth nurse is supposed to be coming to see the pt today and I told the pt to call me if there is any concerns at all with the vac. The pt understands.

## 2011-03-25 NOTE — ED Provider Notes (Signed)
History     CSN: [id number]  Arrival date & time 03/24/11  2127   First MD Initiated Contact with Patient 03/25/11 0006      Chief Complaint  Patient presents with  . Dressing Change     HPI  History provided by the patient and daughter. Patient is a 66 year old Latin American female with history of hypertension, diabetes, CHF, abdominal abscess who presents status post abscess drainage and wound VAC placement with complaints of dislodgment of her wound VAC tubing. Patient reports being discharged earlier this morning following procedure done by Dr. Donne Hazel. She has an open wound to her abdomen with the wound VAC. Patient states the wound VAC came dislodged around 7 or 8 PM. Patient denies having any other symptoms. She does report having some tenderness around her incision still. She denies any other new symptoms, no fever, chills, sweats, chest pain, cough or shortness of breath. Patient has not tried to do anything for the symptoms. She has no other complaints.    Past Medical History  Diagnosis Date  . Hypertension   . CHF (congestive heart failure)   . Arthritis   . Abdominal abscess 12-17-10    abdominal abscesses x2 ? one at this time  . Staph aureus infection November 2012   . Myocardial infarction 12-17-10    80's-abnormal testing showed  . Swelling of both ankles 12-17-10  . Blood transfusion, without reported diagnosis 12-17-10    "thinks in [facility]  . Chronic kidney disease   . Chronic renal insufficiency 2009    sees Dr. Florene Glen  every 4-5 mo  . Anemia   . Diabetes mellitus     type 2 IDDM x 6-7 years  . Sleep apnea 2012  . Coronary artery disease     Dr Kadakia-cardiologist 2-3 x year  . Dysrhythmia     Past Surgical History  Procedure Date  . Ventral hernia repair     component separation, repair with biologic  . Vaginal hysterectomy   . Cardiac catheterization 12-17-10    '80's  . Abdominal hysterectomy 12-17-10    Vaginal Hysterectomy    . Cataract extraction w/ intraocular lens implant 12-17-10    bilateral  . Laparotomy 12/18/2010    Procedure: EXPLORATORY LAPAROTOMY;  Surgeon: Rolm Bookbinder, MD;  Location: [facility] ORS;  Service: General;  Laterality: N/A;  Abdominal Seroma Evacuation  . Back surgery   . Lumbar disc surgery 1970'-80's    X 3  . Eye surgery   . Wound debridement 03/20/2011    Procedure: DEBRIDEMENT ABDOMINAL WOUND;  Surgeon: Rolm Bookbinder, MD;  Location: [facility] OR;  Service: General;  Laterality: N/A;  debridement abdominal wall, placement of wound vac    Family History  Problem Relation Age of Onset  . Hypertension Mother   . Cancer Brother     spine  . Cancer Sister     brain  . Hypertension Maternal Grandmother   . Anesthesia problems Neg Hx     History  Substance Use Topics  . Smoking status: Former Smoker    Quit date: 12/16/1980  . Smokeless tobacco: Never Used  . Alcohol Use: No    OB History    Grav Para Term Preterm Abortions TAB SAB Ect Mult Living                  Review of Systems  All other systems reviewed and are negative.    Allergies  Review of patient's allergies indicates no known allergies.  Home Medications   Current Outpatient Rx  Name Route Sig Dispense Refill  . AMLODIPINE BESYLATE 10 MG PO TABS Oral Take 10 mg by mouth every morning.     . ASPIRIN EC 81 MG PO TBEC Oral Take 81 mg by mouth daily.      Marland Kitchen CALCITRIOL 0.25 MCG PO CAPS Oral Take 0.25 mcg by mouth daily.      Marland Kitchen CALCIUM CARBONATE 200 MG PO CAPS Oral Take 200 mg by mouth daily.     Marland Kitchen CLONIDINE HCL 0.2 MG PO TABS Oral Take 0.2 mg by mouth daily.      Marland Kitchen FERROUS SULFATE 325 (65 FE) MG PO TABS Oral Take 325 mg by mouth daily with breakfast.      . FUROSEMIDE 80 MG PO TABS Oral Take 40-80 mg by mouth daily. Take 80mg  (1 tablet) every morning and take 40mg  ( tablet) every evening.    Marland Kitchen HYDROCODONE-ACETAMINOPHEN 10-325 MG PO TABS Oral Take 1-2 tablets by mouth every 6 (six) hours as needed. For pain     . INSULIN GLARGINE 100 UNIT/ML [place] SOLN Subcutaneous Inject 10 Units into the skin at bedtime.     . INSULIN REGULAR HUMAN IJ Injection Inject 1-9 Units as directed 3 (three) times daily. Per Sliding scale    . ISOSORBIDE MONONITRATE ER 60 MG PO TB24 Oral Take 60 mg by mouth daily.     Marland Kitchen METOPROLOL SUCCINATE ER 50 MG PO TB24 Oral Take 50 mg by mouth every morning.     . ADULT MULTIVITAMIN W/MINERALS CH Oral Take 1 tablet by mouth daily.    Marland Kitchen MUPIROCIN 2 % EX OINT Topical Apply 1 application topically 2 (two) times daily. Apply to bilateral nares for positive PCR    . GLUCERNA MEAL PO Oral Take 237 mLs by mouth daily.     Marland Kitchen ROSUVASTATIN CALCIUM 20 MG PO TABS Oral Take 20 mg by mouth daily.        BP 141/79  Pulse 78  Temp(Src) 98.7 F (37.1 C) (Oral)  Resp 18  SpO2 96%  Physical Exam  Nursing note and vitals reviewed. Constitutional: She is oriented to person, place, and time. She appears well-developed and well-nourished. No distress.  HENT:  Head: Normocephalic.  Cardiovascular: Normal rate and regular rhythm.   Pulmonary/Chest: Effort normal and breath sounds normal. No respiratory distress. She has no wheezes.  Abdominal: Soft. She exhibits no distension. There is tenderness. There is no rebound and no guarding.       Large incision across lower abdomen with wound VAC in place at this time. There is tenderness to palpation around abdomen. No drainage or bleeding around the wound site. Wound VAC is actively draining.  Neurological: She is alert and oriented to person, place, and time.  Skin: Skin is warm and dry. No rash noted. No erythema.  Psychiatric: She has a normal mood and affect. Her behavior is normal.    ED Course  Procedures    1. Open wound with complication       MDM  [id number] PM patient seen and evaluated. Patient no acute distress. Nurse has been positioned wound VAC and applied new Tegaderm over the top. At this time wound VAC is functioning appropriately.  Patient now requesting to return home. Have instructed patient and daughter to followup with Dr. Donne Hazel later today when office opens. They agree and will call for followup appointment.        Martie Lee, [place] 03/25/11 [phone]

## 2011-03-26 ENCOUNTER — Telehealth (INDEPENDENT_AMBULATORY_CARE_PROVIDER_SITE_OTHER): Payer: Self-pay | Admitting: General Surgery

## 2011-03-26 NOTE — Telephone Encounter (Signed)
Called [facility], spoke with Judson Roch who will have the nurse come out to see patient today to check on wound vac function. She will call if there is a problem coming out today. Tried to call patient to make her aware and line is busy.

## 2011-03-27 ENCOUNTER — Telehealth (INDEPENDENT_AMBULATORY_CARE_PROVIDER_SITE_OTHER): Payer: Self-pay | Admitting: General Surgery

## 2011-03-27 ENCOUNTER — Telehealth (INDEPENDENT_AMBULATORY_CARE_PROVIDER_SITE_OTHER): Payer: Self-pay

## 2011-03-27 NOTE — Telephone Encounter (Signed)
[facility] NURSE CALLED TO INCREASE WOUND VAC CHANGE TO 3 TIMES A WEEK DUE TO HIGH OUTPUT ROM WOUND/ REVIEWED WITH DR. Donne Hazel AND HE SAID IT WAS OK TO INCREASE WOUND VAC CHANGE/ NURSE NOTIFIED/GY

## 2011-03-31 NOTE — ED Provider Notes (Signed)
Medical screening examination/treatment/procedure(s) were performed by non-physician practitioner and as supervising physician I was immediately available for consultation/collaboration.  Virgel Manifold, MD 03/31/11 [phone]

## 2011-04-07 ENCOUNTER — Encounter (HOSPITAL_COMMUNITY)
Admission: RE | Admit: 2011-04-07 | Discharge: 2011-04-07 | Disposition: A | Discharge status: Home / Self Care | Payer: Medicare HMO | Source: Ambulatory Visit | Attending: Nephrology | Admitting: Nephrology

## 2011-04-07 DIAGNOSIS — D638 Anemia in other chronic diseases classified elsewhere: Secondary | ICD-10-CM | POA: Insufficient documentation

## 2011-04-07 DIAGNOSIS — N183 Chronic kidney disease, stage 3 unspecified: Secondary | ICD-10-CM | POA: Insufficient documentation

## 2011-04-07 MED ORDER — EPOETIN ALFA 10000 UNIT/ML IJ SOLN
INTRAMUSCULAR | Status: AC
  Administered 2011-04-07: 30000 [IU] via SUBCUTANEOUS
  Filled 2011-04-07: qty 1

## 2011-04-07 MED ORDER — EPOETIN ALFA 10000 UNIT/ML IJ SOLN
30000.0000 [IU] | INTRAMUSCULAR | Status: DC
  Administered 2011-04-07: 30000 [IU] via SUBCUTANEOUS

## 2011-04-07 MED ORDER — EPOETIN ALFA 20000 UNIT/ML IJ SOLN
INTRAMUSCULAR | Status: AC
  Filled 2011-04-07: qty 1

## 2011-04-14 ENCOUNTER — Encounter (INDEPENDENT_AMBULATORY_CARE_PROVIDER_SITE_OTHER): Payer: Self-pay | Admitting: General Surgery

## 2011-04-14 ENCOUNTER — Ambulatory Visit (INDEPENDENT_AMBULATORY_CARE_PROVIDER_SITE_OTHER): Payer: Medicaid Other | Admitting: General Surgery

## 2011-04-14 VITALS — BP 146/88 | HR 76 | Temp 97.0°F | Resp 24 | Ht 65.0 in | Wt 288.6 lb

## 2011-04-14 DIAGNOSIS — Z09 Encounter for follow-up examination after completed treatment for conditions other than malignant neoplasm: Secondary | ICD-10-CM

## 2011-04-14 MED ORDER — OXYCODONE-ACETAMINOPHEN 10-325 MG PO TABS: 1.0000 | ORAL_TABLET | Freq: Four times a day (QID) | ORAL | Status: DC | PRN

## 2011-04-14 NOTE — Progress Notes (Signed)
Subjective:     Patient ID: [name], female   DOB: [birth year], 66 y.o.   MRN: [id number]  HPI 9 yof well known to me who I returned to or for abdominal wall debridement.  I placed a vac at that time and she has been having this changed at home.  She has some pain associated with this and requested different pain meds.  She is otherwise well and her nurse said the area is improving  Review of Systems     Objective:   Physical Exam  Abdominal:         Assessment:     S/p debridement of abdominal wound    Plan:     I removed dressing today and placed wet to dry.  This is much improved and hopefully will heal in next month with vac. Continue vac as doing already and rtc in one month.

## 2011-04-21 ENCOUNTER — Encounter (HOSPITAL_COMMUNITY)
Admission: RE | Admit: 2011-04-21 | Discharge: 2011-04-21 | Disposition: A | Discharge status: Home / Self Care | Payer: Medicare HMO | Source: Ambulatory Visit | Attending: Nephrology | Admitting: Nephrology

## 2011-04-21 DIAGNOSIS — N183 Chronic kidney disease, stage 3 unspecified: Secondary | ICD-10-CM | POA: Insufficient documentation

## 2011-04-21 DIAGNOSIS — D638 Anemia in other chronic diseases classified elsewhere: Secondary | ICD-10-CM | POA: Insufficient documentation

## 2011-04-21 MED ORDER — EPOETIN ALFA 20000 UNIT/ML IJ SOLN
INTRAMUSCULAR | Status: AC
  Filled 2011-04-21: qty 1

## 2011-04-21 MED ORDER — EPOETIN ALFA 10000 UNIT/ML IJ SOLN
30000.0000 [IU] | INTRAMUSCULAR | Status: DC
  Administered 2011-04-21: 30000 [IU] via SUBCUTANEOUS

## 2011-04-21 MED ORDER — EPOETIN ALFA 10000 UNIT/ML IJ SOLN
INTRAMUSCULAR | Status: AC
  Filled 2011-04-21: qty 1

## 2011-05-02 IMAGING — CR DG CHEST 1V PORT
1 series · 1 of 1 positions shown · non-contrast
Comparison: 09/10/2008

CLINICAL DATA: Leg pain, swelling, diabetes, shortness of breath

PORTABLE CHEST - 1 VIEW

[AP]
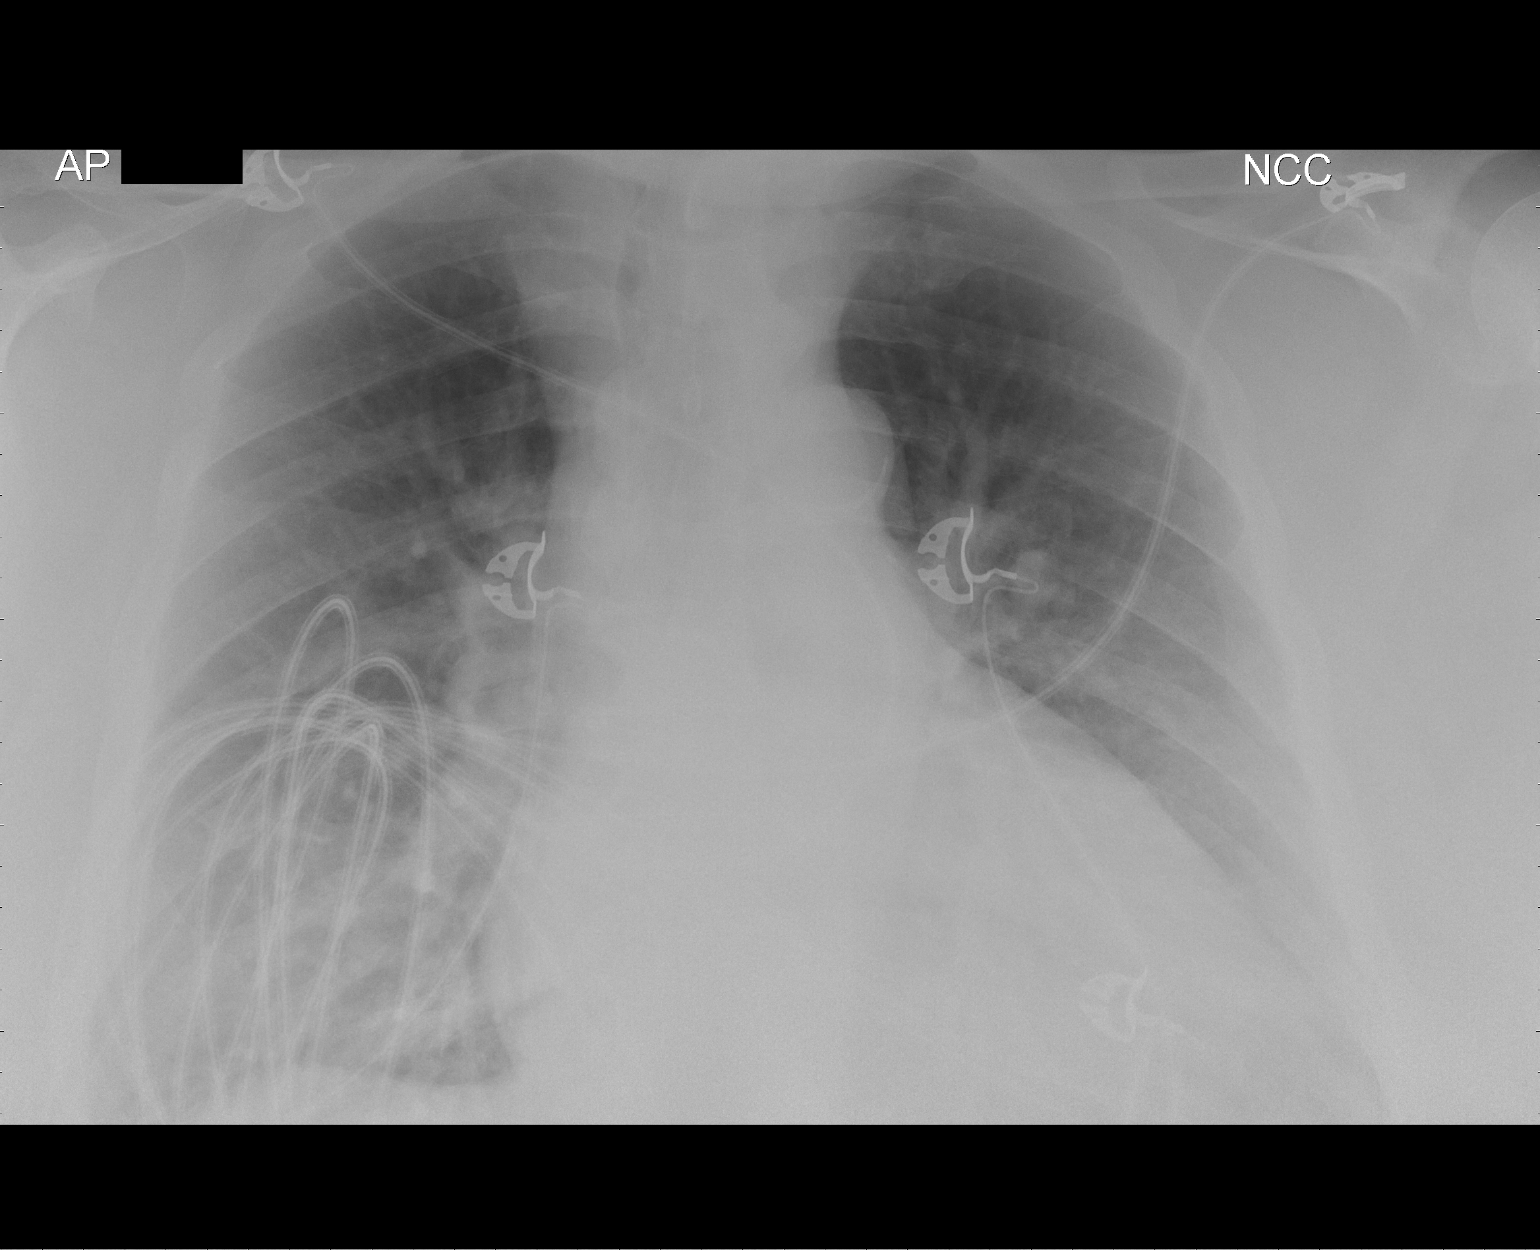

[1 of 1 positions shown; findings below may reference images not displayed]

FINDINGS: Cardiac silhouette is enlarged with vascular congestion
and basilar atelectasis.  Monitor leads overlie the chest.  No
large effusion or pneumothorax.
IMPRESSION: Cardiomegaly with vascular congestion

## 2011-05-05 ENCOUNTER — Encounter (HOSPITAL_COMMUNITY): Payer: Medicare HMO

## 2011-05-06 ENCOUNTER — Encounter (HOSPITAL_COMMUNITY)
Admission: RE | Admit: 2011-05-06 | Discharge: 2011-05-06 | Disposition: A | Discharge status: Home / Self Care | Payer: Medicare HMO | Source: Ambulatory Visit | Attending: Nephrology | Admitting: Nephrology

## 2011-05-06 MED ORDER — EPOETIN ALFA 20000 UNIT/ML IJ SOLN
INTRAMUSCULAR | Status: AC
  Administered 2011-05-06: 20000 [IU] via SUBCUTANEOUS
  Filled 2011-05-06: qty 1

## 2011-05-06 MED ORDER — EPOETIN ALFA 10000 UNIT/ML IJ SOLN
30000.0000 [IU] | INTRAMUSCULAR | Status: DC
  Administered 2011-05-06: 10000 [IU] via SUBCUTANEOUS

## 2011-05-06 MED ORDER — EPOETIN ALFA 10000 UNIT/ML IJ SOLN
INTRAMUSCULAR | Status: AC
  Administered 2011-05-06: 10000 [IU] via SUBCUTANEOUS
  Filled 2011-05-06: qty 1

## 2011-05-08 ENCOUNTER — Telehealth (INDEPENDENT_AMBULATORY_CARE_PROVIDER_SITE_OTHER): Payer: Self-pay | Admitting: General Surgery

## 2011-05-08 ENCOUNTER — Other Ambulatory Visit (INDEPENDENT_AMBULATORY_CARE_PROVIDER_SITE_OTHER): Payer: Self-pay | Admitting: General Surgery

## 2011-05-08 DIAGNOSIS — Z09 Encounter for follow-up examination after completed treatment for conditions other than malignant neoplasm: Secondary | ICD-10-CM

## 2011-05-08 MED ORDER — OXYCODONE-ACETAMINOPHEN 10-325 MG PO TABS: 1.0000 | ORAL_TABLET | Freq: Four times a day (QID) | ORAL | Status: DC | PRN

## 2011-05-08 NOTE — Telephone Encounter (Signed)
Pt calling at the advice of her home health nurse to request refill of her oxycodone 10/325 mg.  She is having a lot of pain at the wound and into her back.  Please let her know:  [phone].

## 2011-05-08 NOTE — Telephone Encounter (Signed)
Dr. Donne Hazel issued script for pain med as requested.  Pt contacted to pick up at front desk.  She is sending [employer] to get it for her.

## 2011-05-15 ENCOUNTER — Other Ambulatory Visit (HOSPITAL_COMMUNITY): Payer: Self-pay | Admitting: *Deleted

## 2011-05-16 ENCOUNTER — Other Ambulatory Visit (INDEPENDENT_AMBULATORY_CARE_PROVIDER_SITE_OTHER): Payer: Self-pay | Admitting: General Surgery

## 2011-05-16 ENCOUNTER — Encounter (INDEPENDENT_AMBULATORY_CARE_PROVIDER_SITE_OTHER): Payer: Self-pay | Admitting: General Surgery

## 2011-05-16 ENCOUNTER — Ambulatory Visit (INDEPENDENT_AMBULATORY_CARE_PROVIDER_SITE_OTHER): Payer: Medicare HMO | Admitting: General Surgery

## 2011-05-16 VITALS — BP 166/88 | HR 82 | Resp 18 | Ht 65.0 in | Wt 287.0 lb

## 2011-05-16 DIAGNOSIS — S31109A Unspecified open wound of abdominal wall, unspecified quadrant without penetration into peritoneal cavity, initial encounter: Secondary | ICD-10-CM

## 2011-05-16 MED ORDER — OXYCODONE-ACETAMINOPHEN 10-325 MG PO TABS: 1.0000 | ORAL_TABLET | Freq: Four times a day (QID) | ORAL | Status: DC | PRN

## 2011-05-16 MED ORDER — HYDROMORPHONE HCL 2 MG PO TABS: 2.0000 mg | ORAL_TABLET | Freq: Two times a day (BID) | ORAL | Status: DC | PRN

## 2011-05-16 NOTE — Progress Notes (Signed)
Addended byRolm Bookbinder on: 05/16/2011 11:30 AM   Modules accepted: Orders

## 2011-05-16 NOTE — Progress Notes (Signed)
Addended by: Illene Regulus on: 05/16/2011 11:01 AM   Modules accepted: Orders

## 2011-05-16 NOTE — Progress Notes (Signed)
Subjective:     Patient ID: [name], female   DOB: 01-04-1946, 66 y.o.   MRN: [id number]  HPI This is a 66 year old female with multiple medical problems who has had  a lot of difficulty after I repaired an incarcerated ventral hernia. This was complicated by a seroma which was percutaneously drained a number of times. This continued to recur and eventually this became infected. I drained this once in the operating room. This healed by secondary intention and recurred. I then did a wide debridement of her entire abdominal wall way down to her fascia. This has been slowly healing by secondary intention and is at a VAC placed on it for a very long time. She been doing well on the last visit. Recently she developed some more abdominal pain. She denies any fevers or any changes in her bowel movements associated with this. She has had pain near one of the sites. She comes in today for reevaluation.  Review of Systems     Objective:   Physical Exam Wound is healing with pink tissue and some exudate at base, stitches visible, remainder of abdomen is really not tender and I can feel no collection, no erythema or any other signs of infection    Assessment:     Open wound, ?recurrence seroma    Plan:     Continue dressing changes I think this is ok but I will check ct scan asap to ensure nothing else causing her increased pain

## 2011-05-20 ENCOUNTER — Encounter (HOSPITAL_COMMUNITY)
Admission: RE | Admit: 2011-05-20 | Discharge: 2011-05-20 | Disposition: A | Discharge status: Home / Self Care | Payer: Medicare HMO | Source: Ambulatory Visit | Attending: Nephrology | Admitting: Nephrology

## 2011-05-20 ENCOUNTER — Ambulatory Visit
Admission: RE | Admit: 2011-05-20 | Discharge: 2011-05-20 | Disposition: A | Discharge status: Home / Self Care | Payer: Medicare HMO | Source: Ambulatory Visit | Attending: General Surgery | Admitting: General Surgery

## 2011-05-20 DIAGNOSIS — S31109A Unspecified open wound of abdominal wall, unspecified quadrant without penetration into peritoneal cavity, initial encounter: Secondary | ICD-10-CM

## 2011-05-20 LAB — RENAL FUNCTION PANEL
Calcium: 8.3 mg/dL — ABNORMAL LOW (ref 8.4–10.5)
GFR calc Af Amer: 9 mL/min — ABNORMAL LOW (ref >90)
GFR calc non Af Amer: 8 mL/min — ABNORMAL LOW (ref >90)
Phosphorus: 5.7 mg/dL — ABNORMAL HIGH (ref 2.3–4.6)
Potassium: 3.7 mEq/L (ref 3.5–5.1)
Sodium: 145 mEq/L (ref 135–145)

## 2011-05-20 LAB — FERRITIN: Ferritin: 688 ng/mL — ABNORMAL HIGH (ref 10–291)

## 2011-05-20 LAB — IRON AND TIBC
Saturation Ratios: 33 % (ref 20–55)
TIBC: 136 ug/dL — ABNORMAL LOW (ref 250–470)

## 2011-05-20 MED ORDER — EPOETIN ALFA 20000 UNIT/ML IJ SOLN
INTRAMUSCULAR | Status: AC
  Filled 2011-05-20: qty 1

## 2011-05-20 MED ORDER — EPOETIN ALFA 10000 UNIT/ML IJ SOLN
30000.0000 [IU] | INTRAMUSCULAR | Status: DC
  Administered 2011-05-20: 30000 [IU] via SUBCUTANEOUS
  Filled 2011-05-20: qty 1

## 2011-05-21 ENCOUNTER — Encounter (HOSPITAL_COMMUNITY): Payer: Medicare HMO

## 2011-05-22 ENCOUNTER — Telehealth (INDEPENDENT_AMBULATORY_CARE_PROVIDER_SITE_OTHER): Payer: Self-pay

## 2011-05-22 NOTE — Telephone Encounter (Signed)
Pt notified CT scan is normal per Dr Donne Hazel.

## 2011-05-23 ENCOUNTER — Encounter (HOSPITAL_COMMUNITY): Payer: Medicare HMO

## 2011-05-23 ENCOUNTER — Telehealth (INDEPENDENT_AMBULATORY_CARE_PROVIDER_SITE_OTHER): Payer: Self-pay

## 2011-05-23 NOTE — Telephone Encounter (Signed)
Maretha from [facility] called to report that the pt's abdominal wound is healing well.  The wound vac has been removed and she is dressing with wet to dry.  She did state there was some yellow exudate at the bottom of the wound and she would like an order to apply Santyl ointment.  She would also like to culture the wound.  Dr. Donne Hazel was paged.  He authorized the Orange, but did not want the wound cultured.  The patient does not have an infection.  I called Maretha and left a VM with this information.

## 2011-05-28 ENCOUNTER — Other Ambulatory Visit (INDEPENDENT_AMBULATORY_CARE_PROVIDER_SITE_OTHER): Payer: Self-pay | Admitting: General Surgery

## 2011-05-28 DIAGNOSIS — S31109A Unspecified open wound of abdominal wall, unspecified quadrant without penetration into peritoneal cavity, initial encounter: Secondary | ICD-10-CM

## 2011-05-28 MED ORDER — HYDROMORPHONE HCL 2 MG PO TABS: 2.0000 mg | ORAL_TABLET | Freq: Two times a day (BID) | ORAL | Status: DC | PRN

## 2011-06-03 ENCOUNTER — Encounter (HOSPITAL_COMMUNITY)
Admission: RE | Admit: 2011-06-03 | Discharge: 2011-06-03 | Disposition: A | Discharge status: Home / Self Care | Payer: Medicare HMO | Source: Ambulatory Visit | Attending: Nephrology | Admitting: Nephrology

## 2011-06-03 DIAGNOSIS — D638 Anemia in other chronic diseases classified elsewhere: Secondary | ICD-10-CM | POA: Insufficient documentation

## 2011-06-03 DIAGNOSIS — N183 Chronic kidney disease, stage 3 unspecified: Secondary | ICD-10-CM | POA: Insufficient documentation

## 2011-06-03 MED ORDER — EPOETIN ALFA 10000 UNIT/ML IJ SOLN
30000.0000 [IU] | INTRAMUSCULAR | Status: DC
  Administered 2011-06-03: 30000 [IU] via SUBCUTANEOUS

## 2011-06-03 MED ORDER — EPOETIN ALFA 10000 UNIT/ML IJ SOLN
INTRAMUSCULAR | Status: AC
  Administered 2011-06-03: 30000 [IU] via SUBCUTANEOUS
  Filled 2011-06-03: qty 1

## 2011-06-03 MED ORDER — EPOETIN ALFA 20000 UNIT/ML IJ SOLN
INTRAMUSCULAR | Status: AC
  Filled 2011-06-03: qty 1

## 2011-06-11 ENCOUNTER — Telehealth (INDEPENDENT_AMBULATORY_CARE_PROVIDER_SITE_OTHER): Payer: Self-pay

## 2011-06-11 MED ORDER — OXYCODONE-ACETAMINOPHEN 10-325 MG PO TABS: 1.0000 | ORAL_TABLET | Freq: Four times a day (QID) | ORAL | Status: DC | PRN

## 2011-06-11 NOTE — Telephone Encounter (Signed)
Patient called in for refill of oxycodone 10/325. Please advise.Marland Kitchen

## 2011-06-11 NOTE — Telephone Encounter (Signed)
Medication written, signed per Dr Margot Chimes. Patient aware RX at the front for pick up.

## 2011-06-17 ENCOUNTER — Encounter (HOSPITAL_COMMUNITY)
Admission: RE | Admit: 2011-06-17 | Discharge: 2011-06-17 | Disposition: A | Discharge status: Home / Self Care | Payer: Medicare HMO | Source: Ambulatory Visit | Attending: Nephrology | Admitting: Nephrology

## 2011-06-17 MED ORDER — CLONIDINE HCL 0.1 MG PO TABS
0.1000 mg | ORAL_TABLET | Freq: Once | ORAL | Status: AC | PRN
  Administered 2011-06-17: 0.1 mg via ORAL
  Filled 2011-06-17: qty 1

## 2011-06-17 MED ORDER — EPOETIN ALFA 10000 UNIT/ML IJ SOLN
30000.0000 [IU] | INTRAMUSCULAR | Status: DC
  Administered 2011-06-17: 10000 [IU] via SUBCUTANEOUS
  Filled 2011-06-17: qty 1

## 2011-06-17 MED ORDER — EPOETIN ALFA 20000 UNIT/ML IJ SOLN
INTRAMUSCULAR | Status: AC
  Administered 2011-06-17: 20000 [IU] via SUBCUTANEOUS
  Filled 2011-06-17: qty 1

## 2011-06-18 ENCOUNTER — Telehealth (INDEPENDENT_AMBULATORY_CARE_PROVIDER_SITE_OTHER): Payer: Self-pay

## 2011-06-18 NOTE — Telephone Encounter (Signed)
LMOM stating that I did speak with Dr Donne Hazel and he said do not mess with the sutures in the abdominal wound. I made a f/u appt with Dr Donne Hazel for the pt but if the pt can't come tomorrow she just needs to be seen in the next couple of weeks.

## 2011-06-19 ENCOUNTER — Ambulatory Visit (INDEPENDENT_AMBULATORY_CARE_PROVIDER_SITE_OTHER): Payer: Medicare HMO | Admitting: General Surgery

## 2011-06-19 ENCOUNTER — Encounter (INDEPENDENT_AMBULATORY_CARE_PROVIDER_SITE_OTHER): Payer: Self-pay | Admitting: General Surgery

## 2011-06-19 VITALS — BP 140/94 | HR 98 | Temp 97.1°F | Ht 65.0 in | Wt 283.0 lb

## 2011-06-19 DIAGNOSIS — S31109A Unspecified open wound of abdominal wall, unspecified quadrant without penetration into peritoneal cavity, initial encounter: Secondary | ICD-10-CM

## 2011-06-19 NOTE — Progress Notes (Signed)
Subjective:     Patient ID: [name], female   DOB: 1945/09/29, 65 y.o.   MRN: [id number]  HPI This is a 66 year old female with multiple medical problems who has had a lot of difficulty after I repaired an incarcerated ventral hernia. This was complicated by a seroma which was percutaneously drained a number of times. This continued to recur and eventually this became infected. I drained this once in the operating room. This healed by secondary intention and recurred. I then did a wide debridement of her entire abdominal wall way down to her fascia. This has been slowly healing by secondary intention and now is nearly healed.  She still has abdominal pain esp when she is having bowel movements.  She comes in for follow up today   Review of Systems     Objective:   Physical Exam Wound is nearly healed with 2x3 cm granulating hole in  Middle with stitch material visible, no infection, scar tender throughout    Assessment:     Open wound anterior abd wall    Plan:     Continue dressing changes I offered to have her see GI due to issue with bm but she doesn't want to right now I will see her back in one month.

## 2011-07-01 ENCOUNTER — Encounter (HOSPITAL_COMMUNITY)
Admission: RE | Admit: 2011-07-01 | Discharge: 2011-07-01 | Disposition: A | Discharge status: Home / Self Care | Payer: Medicare HMO | Source: Ambulatory Visit | Attending: Nephrology | Admitting: Nephrology

## 2011-07-01 DIAGNOSIS — D638 Anemia in other chronic diseases classified elsewhere: Secondary | ICD-10-CM | POA: Insufficient documentation

## 2011-07-01 DIAGNOSIS — N183 Chronic kidney disease, stage 3 unspecified: Secondary | ICD-10-CM | POA: Insufficient documentation

## 2011-07-01 LAB — POCT HEMOGLOBIN-HEMACUE: Hemoglobin: 10 g/dL — ABNORMAL LOW (ref 12.0–15.0)

## 2011-07-01 MED ORDER — EPOETIN ALFA 10000 UNIT/ML IJ SOLN
30000.0000 [IU] | INTRAMUSCULAR | Status: DC
  Administered 2011-07-01: 30000 [IU] via SUBCUTANEOUS

## 2011-07-01 MED ORDER — EPOETIN ALFA 20000 UNIT/ML IJ SOLN
INTRAMUSCULAR | Status: AC
  Filled 2011-07-01: qty 1

## 2011-07-01 MED ORDER — EPOETIN ALFA 10000 UNIT/ML IJ SOLN
INTRAMUSCULAR | Status: AC
  Administered 2011-07-01: 30000 [IU] via SUBCUTANEOUS
  Filled 2011-07-01: qty 1

## 2011-07-03 ENCOUNTER — Telehealth (INDEPENDENT_AMBULATORY_CARE_PROVIDER_SITE_OTHER): Payer: Self-pay | Admitting: General Surgery

## 2011-07-03 NOTE — Telephone Encounter (Signed)
Percocet #40 written per Dr Donne Hazel, at the front for patient pick up. Patient aware.

## 2011-07-03 NOTE — Telephone Encounter (Signed)
MS Riendeau CALLED TO REQUEST PAIN MED REFILL. LAST WRITTEN BY DR. Margot Chimes FOR OXYCODONE 10/325 ON 06-11-2011. PT'S PHONE # [phone]/ PT HAS F/U WITH DR. Donne Hazel END OF MONTH./GY

## 2011-07-15 ENCOUNTER — Encounter (HOSPITAL_COMMUNITY)
Admission: RE | Admit: 2011-07-15 | Discharge: 2011-07-15 | Disposition: A | Discharge status: Home / Self Care | Payer: Medicare HMO | Source: Ambulatory Visit | Attending: Nephrology | Admitting: Nephrology

## 2011-07-15 MED ORDER — EPOETIN ALFA 10000 UNIT/ML IJ SOLN
30000.0000 [IU] | INTRAMUSCULAR | Status: DC
  Administered 2011-07-15: 30000 [IU] via SUBCUTANEOUS

## 2011-07-15 MED ORDER — EPOETIN ALFA 20000 UNIT/ML IJ SOLN
INTRAMUSCULAR | Status: AC
  Filled 2011-07-15: qty 1

## 2011-07-15 MED ORDER — EPOETIN ALFA 10000 UNIT/ML IJ SOLN
INTRAMUSCULAR | Status: AC
  Administered 2011-07-15: 30000 [IU] via SUBCUTANEOUS
  Filled 2011-07-15: qty 1

## 2011-07-18 ENCOUNTER — Ambulatory Visit (INDEPENDENT_AMBULATORY_CARE_PROVIDER_SITE_OTHER): Payer: Medicare HMO | Admitting: General Surgery

## 2011-07-18 ENCOUNTER — Encounter (INDEPENDENT_AMBULATORY_CARE_PROVIDER_SITE_OTHER): Payer: Self-pay | Admitting: General Surgery

## 2011-07-18 VITALS — BP 158/88 | HR 71 | Temp 97.1°F | Ht 65.0 in | Wt 283.8 lb

## 2011-07-18 DIAGNOSIS — S31109A Unspecified open wound of abdominal wall, unspecified quadrant without penetration into peritoneal cavity, initial encounter: Secondary | ICD-10-CM

## 2011-07-18 NOTE — Progress Notes (Signed)
Subjective:     Patient ID: [name], female   DOB: March 22, 1945, 66 y.o.   MRN: [id number]  HPI 34 yof with open anterior wound s/p debridement. This is slowly improving and getting smaller. She still has some constipation but is otherwise well.  Review of Systems     Objective:   Physical Exam Transverse incision clean and nearly healed except central portion under skin fold with 1x1cm open are with granulation tissue and stitch material    Assessment:     Open wound, abdominal wall    Plan:     I debrided suture material and I think wound will heal now.  I told her no more pain meds as she should be fine on tylenol or otc meds.  I think this is in large part why she is constipated.  I will see back in 2 months and will continue dressing changes.

## 2011-07-28 ENCOUNTER — Telehealth (INDEPENDENT_AMBULATORY_CARE_PROVIDER_SITE_OTHER): Payer: Self-pay

## 2011-07-28 NOTE — Telephone Encounter (Signed)
The pt called regarding her abdominal pain she has been having.  She has tried ES Tylenol and it does not help.  She is asking if Dr Donne Hazel would prescribe something else for pain.   She has no fever, no nausea or vomiting.  She uses [facility] [address].

## 2011-07-29 ENCOUNTER — Telehealth (INDEPENDENT_AMBULATORY_CARE_PROVIDER_SITE_OTHER): Payer: Self-pay

## 2011-07-29 ENCOUNTER — Other Ambulatory Visit (INDEPENDENT_AMBULATORY_CARE_PROVIDER_SITE_OTHER): Payer: Self-pay | Admitting: General Surgery

## 2011-07-29 ENCOUNTER — Encounter (HOSPITAL_COMMUNITY)
Admission: RE | Admit: 2011-07-29 | Discharge: 2011-07-29 | Disposition: A | Discharge status: Home / Self Care | Payer: Medicare HMO | Source: Ambulatory Visit | Attending: Nephrology | Admitting: Nephrology

## 2011-07-29 DIAGNOSIS — N183 Chronic kidney disease, stage 3 unspecified: Secondary | ICD-10-CM | POA: Insufficient documentation

## 2011-07-29 DIAGNOSIS — D638 Anemia in other chronic diseases classified elsewhere: Secondary | ICD-10-CM | POA: Insufficient documentation

## 2011-07-29 LAB — RENAL FUNCTION PANEL
Albumin: 2.2 g/dL — ABNORMAL LOW (ref 3.5–5.2)
GFR calc Af Amer: 7 mL/min — ABNORMAL LOW (ref >90)
Glucose, Bld: 142 mg/dL — ABNORMAL HIGH (ref 70–99)
Phosphorus: 5.1 mg/dL — ABNORMAL HIGH (ref 2.3–4.6)
Potassium: 3.3 mEq/L — ABNORMAL LOW (ref 3.5–5.1)
Sodium: 141 mEq/L (ref 135–145)

## 2011-07-29 LAB — IRON AND TIBC: Saturation Ratios: 15 % — ABNORMAL LOW (ref 20–55)

## 2011-07-29 LAB — POCT HEMOGLOBIN-HEMACUE: Hemoglobin: 8.2 g/dL — ABNORMAL LOW (ref 12.0–15.0)

## 2011-07-29 MED ORDER — EPOETIN ALFA 20000 UNIT/ML IJ SOLN
INTRAMUSCULAR | Status: AC
  Administered 2011-07-29: 20000 [IU] via SUBCUTANEOUS
  Filled 2011-07-29: qty 1

## 2011-07-29 MED ORDER — EPOETIN ALFA 10000 UNIT/ML IJ SOLN
30000.0000 [IU] | INTRAMUSCULAR | Status: DC
  Administered 2011-07-29: 10000 [IU] via SUBCUTANEOUS

## 2011-07-29 MED ORDER — OXYCODONE-ACETAMINOPHEN 10-325 MG PO TABS: 1.0000 | ORAL_TABLET | Freq: Four times a day (QID) | ORAL | Status: DC | PRN

## 2011-07-29 MED ORDER — EPOETIN ALFA 10000 UNIT/ML IJ SOLN
INTRAMUSCULAR | Status: AC
  Filled 2011-07-29: qty 1

## 2011-07-29 NOTE — Telephone Encounter (Signed)
Ms. Risdon calling to check the status of her pain medication refill from 07/28/11.

## 2011-08-04 ENCOUNTER — Other Ambulatory Visit: Payer: Self-pay

## 2011-08-04 DIAGNOSIS — N184 Chronic kidney disease, stage 4 (severe): Secondary | ICD-10-CM

## 2011-08-04 DIAGNOSIS — Z01810 Encounter for preprocedural cardiovascular examination: Secondary | ICD-10-CM

## 2011-08-05 ENCOUNTER — Emergency Department (HOSPITAL_COMMUNITY): Payer: Medicare HMO

## 2011-08-05 ENCOUNTER — Emergency Department (HOSPITAL_COMMUNITY)
Admission: EM | Admit: 2011-08-05 | Discharge: 2011-08-05 | Disposition: A | Discharge status: Other Acute Inpatient Hospital | Payer: Medicare HMO | Attending: Emergency Medicine | Admitting: Emergency Medicine

## 2011-08-05 ENCOUNTER — Encounter (HOSPITAL_COMMUNITY): Payer: Self-pay | Admitting: *Deleted

## 2011-08-05 ENCOUNTER — Encounter (INDEPENDENT_AMBULATORY_CARE_PROVIDER_SITE_OTHER): Payer: Self-pay | Admitting: General Surgery

## 2011-08-05 DIAGNOSIS — S0530XA Ocular laceration without prolapse or loss of intraocular tissue, unspecified eye, initial encounter: Secondary | ICD-10-CM | POA: Insufficient documentation

## 2011-08-05 DIAGNOSIS — R55 Syncope and collapse: Secondary | ICD-10-CM | POA: Insufficient documentation

## 2011-08-05 DIAGNOSIS — S0531XA Ocular laceration without prolapse or loss of intraocular tissue, right eye, initial encounter: Secondary | ICD-10-CM

## 2011-08-05 DIAGNOSIS — I10 Essential (primary) hypertension: Secondary | ICD-10-CM | POA: Insufficient documentation

## 2011-08-05 DIAGNOSIS — W1809XA Striking against other object with subsequent fall, initial encounter: Secondary | ICD-10-CM | POA: Insufficient documentation

## 2011-08-05 DIAGNOSIS — E119 Type 2 diabetes mellitus without complications: Secondary | ICD-10-CM | POA: Insufficient documentation

## 2011-08-05 LAB — CBC WITH DIFFERENTIAL/PLATELET
HCT: 29.6 % — ABNORMAL LOW (ref 36.0–46.0)
Hemoglobin: 9.3 g/dL — ABNORMAL LOW (ref 12.0–15.0)
Lymphocytes Relative: 31 % (ref 12–46)
Lymphs Abs: 3.7 10*3/uL (ref 0.7–4.0)
Monocytes Absolute: 0.7 10*3/uL (ref 0.1–1.0)
Monocytes Relative: 6 % (ref 3–12)
Neutro Abs: 7.2 10*3/uL (ref 1.7–7.7)
WBC: 11.9 10*3/uL — ABNORMAL HIGH (ref 4.0–10.5)

## 2011-08-05 LAB — COMPREHENSIVE METABOLIC PANEL
AST: 20 U/L (ref 0–37)
BUN: 64 mg/dL — ABNORMAL HIGH (ref 6–23)
CO2: 17 mEq/L — ABNORMAL LOW (ref 19–32)
Chloride: 106 mEq/L (ref 96–112)
Creatinine, Ser: 6.09 mg/dL — ABNORMAL HIGH (ref 0.50–1.10)
GFR calc non Af Amer: 6 mL/min — ABNORMAL LOW (ref >90)
Glucose, Bld: 161 mg/dL — ABNORMAL HIGH (ref 70–99)
Total Bilirubin: 0.2 mg/dL — ABNORMAL LOW (ref 0.3–1.2)

## 2011-08-05 MED ORDER — MORPHINE SULFATE 4 MG/ML IJ SOLN
4.0000 mg | Freq: Once | INTRAMUSCULAR | Status: AC
  Administered 2011-08-05: 4 mg via INTRAVENOUS
  Filled 2011-08-05: qty 1

## 2011-08-05 MED ORDER — ONDANSETRON HCL 4 MG/2ML IJ SOLN
4.0000 mg | Freq: Once | INTRAMUSCULAR | Status: AC
  Administered 2011-08-05: 4 mg via INTRAVENOUS
  Filled 2011-08-05: qty 2

## 2011-08-05 MED ORDER — MOXIFLOXACIN HCL IN NACL 400 MG/250ML IV SOLN
400.0000 mg | Freq: Once | INTRAVENOUS | Status: AC
  Administered 2011-08-05: 400 mg via INTRAVENOUS
  Filled 2011-08-05: qty 250

## 2011-08-05 MED ORDER — TETANUS-DIPHTH-ACELL PERTUSSIS 5-2.5-18.5 LF-MCG/0.5 IM SUSP
0.5000 mL | Freq: Once | INTRAMUSCULAR | Status: AC
  Administered 2011-08-05: 0.5 mL via INTRAMUSCULAR
  Filled 2011-08-05: qty 0.5

## 2011-08-05 NOTE — Progress Notes (Signed)
CHAPLAIN NOTE: 205am. Responded to trauma page. Spoke with staff & EMT's for background. Spoke with pt briefly. Asked pt if we needed to contact anyone. Pt indicated there no one needed to be notified. 220am.

## 2011-08-05 NOTE — Consult Note (Signed)
Cc: Ruptured globe HPI: Patient was brought in as a level 1 trauma, reported to have had a syncopal episode, during which she hit her head on the side of a table rupturing her right eye and extruding her intraocular lens implant. She complains of pain and poor vision OD.  PMH: Had cataract surgery in the past by Dr. Gershon Crane. HTN, CHF, DM. ALL: NKDA SH: deferred FH: deferred  EXAM: Vision OD: HM  Penlight exam right: Lids normal, were gently opened manually to examine globe. Globe rupture with dark tissue, probable uvea, extending from limbus at 9 oclock superiorly and inferiorly, and posteriorly. Cornea appears intact and anterior chamber with heme, flat. In cup at bedside is 3 piece intraocular lens implant, brought in by EMS.   Protective shield was taped over the eye.  Imp/Plan: Open/ruptured globe with extrusion of intraocular lens implant OD. Due to posterior nature of rupture recommend transfer to tertiary care center for repair with retina specialist support. Findings communicated to Dr. Stark Jock.

## 2011-08-05 NOTE — ED Provider Notes (Signed)
History     CSN: [id number]  Arrival date & time 08/05/11  0209   First MD Initiated Contact with Patient 08/05/11 0217      Chief Complaint  Patient presents with  . Eye Injury    (Consider location/radiation/quality/duration/timing/severity/associated sxs/prior treatment) HPI Comments: Patient had an apparent syncopal episode at home, fell, and struck her eye on the corner of a table.  She was brought here by ems for eval of an eye injury.  She has a history of cataracts with a lens implant in the past and the implant was noted on her cheek.    Patient is a 66 y.o. female presenting with eye injury. The history is provided by the patient.  Eye Injury This is a new problem. The current episode started less than 1 hour ago. The problem occurs constantly. The problem has not changed since onset.Pertinent negatives include no chest pain and no shortness of breath. Nothing aggravates the symptoms. Nothing relieves the symptoms. She has tried nothing for the symptoms. The treatment provided no relief.    Past Medical History  Diagnosis Date  . CHF (congestive heart failure)   . Hypertension   . Diabetes mellitus     Past Surgical History  Procedure Date  . Abdominal hysterectomy   . Cataract extraction, bilateral     History reviewed. No pertinent family history.  History  Substance Use Topics  . Smoking status: Never Smoker   . Smokeless tobacco: Not on file  . Alcohol Use: No    OB History    Grav Para Term Preterm Abortions TAB SAB Ect Mult Living                  Review of Systems  Respiratory: Negative for shortness of breath.   Cardiovascular: Negative for chest pain.  All other systems reviewed and are negative.    Allergies  Review of patient's allergies indicates no known allergies.  Home Medications   Current Outpatient Rx  Name Route Sig Dispense Refill  . AMLODIPINE BESYLATE 5 MG PO TABS Oral Take 5 mg by mouth daily.    . ATORVASTATIN CALCIUM  40 MG PO TABS Oral Take 40 mg by mouth daily.    Marland Kitchen CALCITRIOL 0.25 MCG PO CAPS Oral Take 0.25 mcg by mouth daily.    Marland Kitchen CLONIDINE HCL 0.3 MG PO TABS Oral Take 0.3 mg by mouth 3 (three) times daily.    . COLCHICINE 0.6 MG PO TABS Oral Take 0.6 mg by mouth daily.    Marland Kitchen DOXYCYCLINE HYCLATE 100 MG PO TABS Oral Take 100 mg by mouth 2 (two) times daily.    Marland Kitchen FERROUS SULFATE 325 (65 FE) MG PO TABS Oral Take 325 mg by mouth daily with breakfast.    . FUROSEMIDE 80 MG PO TABS Oral Take 80 mg by mouth 2 (two) times daily.    Marland Kitchen HYDROCODONE-ACETAMINOPHEN 10-325 MG PO TABS Oral Take 1 tablet by mouth every 6 (six) hours as needed. For pain    . HYDROMORPHONE HCL 2 MG PO TABS Oral Take 2 mg by mouth every 12 (twelve) hours as needed. For pain    . INSULIN LISPRO (HUMAN) 100 UNIT/ML [place] SOLN Subcutaneous Inject 5 Units into the skin 3 (three) times daily before meals.    . ISOSORBIDE MONONITRATE ER 60 MG PO TB24 Oral Take 60 mg by mouth daily.    Marland Kitchen LISINOPRIL 10 MG PO TABS Oral Take 10 mg by mouth daily.    Marland Kitchen  METOPROLOL SUCCINATE ER 50 MG PO TB24 Oral Take 50 mg by mouth daily. Take with or immediately following a meal.    . PANTOPRAZOLE SODIUM 40 MG PO TBEC Oral Take 40 mg by mouth daily.    Marland Kitchen PIOGLITAZONE HCL 15 MG PO TABS Oral Take 15 mg by mouth daily.    Marland Kitchen ROSUVASTATIN CALCIUM 20 MG PO TABS Oral Take 20 mg by mouth daily.      BP 216/98  Pulse 76  Temp 98.1 F (36.7 C) (Oral)  Resp 21  Ht 5\' 5"  (1.651 m)  Wt 282 lb (127.914 kg)  BMI 46.93 kg/m2  SpO2 95%  Physical Exam  Nursing note and vitals reviewed. Constitutional: She appears well-developed and well-nourished.       Patient very uncomfortable.  HENT:  Head: Normocephalic and atraumatic.  Eyes:       The right eye is noted to have a globe laceration adjacent to the limbus that extends superiorly, inferiorly, and posteriorly.  There is leaking vitreous from the laceration.  The lens implant is noted and has been placed in a specimen cup.   She is unable to see from the eye.  Neck: Normal range of motion. Neck supple.  Cardiovascular: Normal rate.  Exam reveals no friction rub.   No murmur heard. Pulmonary/Chest: Effort normal and breath sounds normal.  Abdominal: Soft. Bowel sounds are normal.  Musculoskeletal: Normal range of motion. She exhibits no edema.  Skin: Skin is dry. She is not diaphoretic.    ED Course  Procedures (including critical care time)  Labs Reviewed  CBC WITH DIFFERENTIAL - Abnormal; Notable for the following:    WBC 11.9 (*)     RBC 3.43 (*)     Hemoglobin 9.3 (*)     HCT 29.6 (*)     RDW 15.9 (*)     All other components within normal limits  COMPREHENSIVE METABOLIC PANEL - Abnormal; Notable for the following:    CO2 17 (*)     Glucose, Bld 161 (*)     BUN 64 (*)     Creatinine, Ser 6.09 (*)     Calcium 8.3 (*)     Albumin 2.5 (*)     Total Bilirubin 0.2 (*)     GFR calc non Af Amer 6 (*)     GFR calc Af Amer 7 (*)     All other components within normal limits  TYPE AND SCREEN   Ct Head Wo Contrast  08/05/2011  *RADIOLOGY REPORT*  Clinical Data:  Syncope.  Fall.  Trauma to right eye.  Facial injuries.  CT HEAD WITHOUT CONTRAST CT MAXILLOFACIAL WITHOUT CONTRAST  Technique:  Multidetector CT imaging of the head and maxillofacial structures were performed using the standard protocol without intravenous contrast. Multiplanar CT image reconstructions of the maxillofacial structures were also generated.  Comparison:   None.  CT HEAD  Findings: Mild cerebral atrophy.  No mass effect or midline shift. No abnormal extra-axial fluid collections.  Gray-white matter junctions are distinct.  Basal cisterns are not effaced.  No evidence of acute intracranial hemorrhage.  No depressed skull fractures.  Visualized mastoid air cells are not opacified.  IMPRESSION: No acute intracranial abnormalities.  CT MAXILLOFACIAL  Findings:   There appears to be a right ocular injury with proptosis and flattening deformity  of the globe with increased density in the vitreous fluid consistent with hemorrhage.  Consider rupture of the globe and / or retinal detachment.  There is  membrane thickening in the sphenoid sinuses and ethmoid air cells as well as the maxillary antra bilaterally.  No acute air- fluid levels are demonstrated and this is probably due to a preexisting inflammatory change.  The orbital rims, maxillary antral walls, nasal bones, nasal septum, nasal spine, pterygoid plates, zygomatic arches, temporomandibular joints, and mandibles appear intact.  No displaced fractures are identified.  Multiple tooth extractions.  Degenerative changes in the upper cervical spine.  IMPRESSION: Apparent injury to the right globe with proptosis and deformity of the globe and hemorrhage in the vitreous fluid.  No displaced orbital or facial fractures are appreciated.  Original Report Authenticated By: Neale Burly, M.D.   Ct Maxillofacial Wo Cm  08/05/2011  *RADIOLOGY REPORT*  Clinical Data:  Syncope.  Fall.  Trauma to right eye.  Facial injuries.  CT HEAD WITHOUT CONTRAST CT MAXILLOFACIAL WITHOUT CONTRAST  Technique:  Multidetector CT imaging of the head and maxillofacial structures were performed using the standard protocol without intravenous contrast. Multiplanar CT image reconstructions of the maxillofacial structures were also generated.  Comparison:   None.  CT HEAD  Findings: Mild cerebral atrophy.  No mass effect or midline shift. No abnormal extra-axial fluid collections.  Gray-white matter junctions are distinct.  Basal cisterns are not effaced.  No evidence of acute intracranial hemorrhage.  No depressed skull fractures.  Visualized mastoid air cells are not opacified.  IMPRESSION: No acute intracranial abnormalities.  CT MAXILLOFACIAL  Findings:   There appears to be a right ocular injury with proptosis and flattening deformity of the globe with increased density in the vitreous fluid consistent with hemorrhage.   Consider rupture of the globe and / or retinal detachment.  There is membrane thickening in the sphenoid sinuses and ethmoid air cells as well as the maxillary antra bilaterally.  No acute air- fluid levels are demonstrated and this is probably due to a preexisting inflammatory change.  The orbital rims, maxillary antral walls, nasal bones, nasal septum, nasal spine, pterygoid plates, zygomatic arches, temporomandibular joints, and mandibles appear intact.  No displaced fractures are identified.  Multiple tooth extractions.  Degenerative changes in the upper cervical spine.  IMPRESSION: Apparent injury to the right globe with proptosis and deformity of the globe and hemorrhage in the vitreous fluid.  No displaced orbital or facial fractures are appreciated.  Original Report Authenticated By: Neale Burly, M.D.     1. Syncope   2. Ruptured globe of right eye      Date: 08/05/2011  Rate: 72  Rhythm: normal sinus rhythm  QRS Axis: left  Intervals: normal  ST/T Wave abnormalities: normal  Conduction Disutrbances:none  Narrative Interpretation:   Old EKG Reviewed: none available    MDM  The patient arrived to the ED after a syncopal episode at home.  The workup into her syncope did not reveal any emergent cause.  The labs and head ct look okay as does the ekg.  She also does not appear to have suffered any addition injury.    She has an obvious globe laceration with dislodgement of the lens implant she had placed in the past.  I spoke with Dr. Satira Sark who came to the ED and examined the patient.  It was his opinion that he could not provide Mrs. Mcilrath with the services she needed for repair of her eye.  He recommended I transfer her to a facility that could.  I then spoke with Dr. Meda Coffee from [facility] who was willing to take the patient  in transfer.  She will be sent there for further evaluation and treatment.    CRITICAL CARE Performed by: Veryl Speak   Total critical care time: 30  minutes  Critical care time was exclusive of separately billable procedures and treating other patients.  Critical care was necessary to treat or prevent imminent or life-threatening deterioration.  Critical care was time spent personally by me on the following activities: development of treatment plan with patient and/or surrogate as well as nursing, discussions with consultants, evaluation of patient's response to treatment, examination of patient, obtaining history from patient or surrogate, ordering and performing treatments and interventions, ordering and review of laboratory studies, ordering and review of radiographic studies, pulse oximetry and re-evaluation of patient's condition.         Veryl Speak, MD 08/05/11 [phone]

## 2011-08-05 NOTE — Progress Notes (Signed)
This patient was brought in as a Level I trauma for penetrating injury to her right eye with a ruptured globe.  Upon questioning the patient on arrival she had a syncopal episode and hit her head on the dresser, Bluntly rupturing her right globe, extruding her lens implant.  I down graded the patient to Level II, will need Medical admission for syncopal episode, and opthalmology for the ruptured globe.  Will need CT of her head and face.  Appropriate consultations to follow (i.e. maxillo-facial &/or neurosurgery).  Kathryne Eriksson. Dahlia Bailiff, MD, [place] [phone] Trauma Surgeon

## 2011-08-05 NOTE — ED Notes (Signed)
EMS adm 139mcg Fentanyl

## 2011-08-05 NOTE — ED Notes (Signed)
Patient arrives via EMS stating that she fell or passed out and hit her right eye on the corner of the dresser drawer.  Patient alert

## 2011-08-05 NOTE — ED Notes (Signed)
[facility] here and gone with patient.  Lens and belongings sent with patient.  Report given to [place], [place]

## 2011-08-11 ENCOUNTER — Other Ambulatory Visit (HOSPITAL_COMMUNITY): Payer: Self-pay | Admitting: *Deleted

## 2011-08-12 ENCOUNTER — Encounter (HOSPITAL_COMMUNITY)
Admission: RE | Admit: 2011-08-12 | Discharge: 2011-08-12 | Disposition: A | Discharge status: Home / Self Care | Payer: Medicare HMO | Source: Ambulatory Visit | Attending: Nephrology | Admitting: Nephrology

## 2011-08-12 MED ORDER — EPOETIN ALFA 10000 UNIT/ML IJ SOLN
30000.0000 [IU] | INTRAMUSCULAR | Status: DC
  Administered 2011-08-12: 30000 [IU] via SUBCUTANEOUS

## 2011-08-12 MED ORDER — EPOETIN ALFA 20000 UNIT/ML IJ SOLN
INTRAMUSCULAR | Status: AC
  Filled 2011-08-12: qty 1

## 2011-08-12 MED ORDER — SODIUM CHLORIDE 0.9 % IV SOLN
INTRAVENOUS | Status: DC
  Administered 2011-08-12: 10:00:00 via INTRAVENOUS

## 2011-08-12 MED ORDER — FERUMOXYTOL INJECTION 510 MG/17 ML
510.0000 mg | INTRAVENOUS | Status: DC
  Administered 2011-08-12: 510 mg via INTRAVENOUS
  Filled 2011-08-12: qty 17

## 2011-08-12 MED ORDER — EPOETIN ALFA 10000 UNIT/ML IJ SOLN
INTRAMUSCULAR | Status: AC
  Filled 2011-08-12: qty 1

## 2011-08-18 ENCOUNTER — Other Ambulatory Visit: Payer: Self-pay | Admitting: Internal Medicine

## 2011-08-18 DIAGNOSIS — Z1231 Encounter for screening mammogram for malignant neoplasm of breast: Secondary | ICD-10-CM

## 2011-08-20 IMAGING — CR DG CHEST 1V PORT
1 series · 1 of 1 positions shown · non-contrast
Comparison: 12/23/2009

CLINICAL DATA: Chest pain

PORTABLE CHEST - 1 VIEW

[AP]
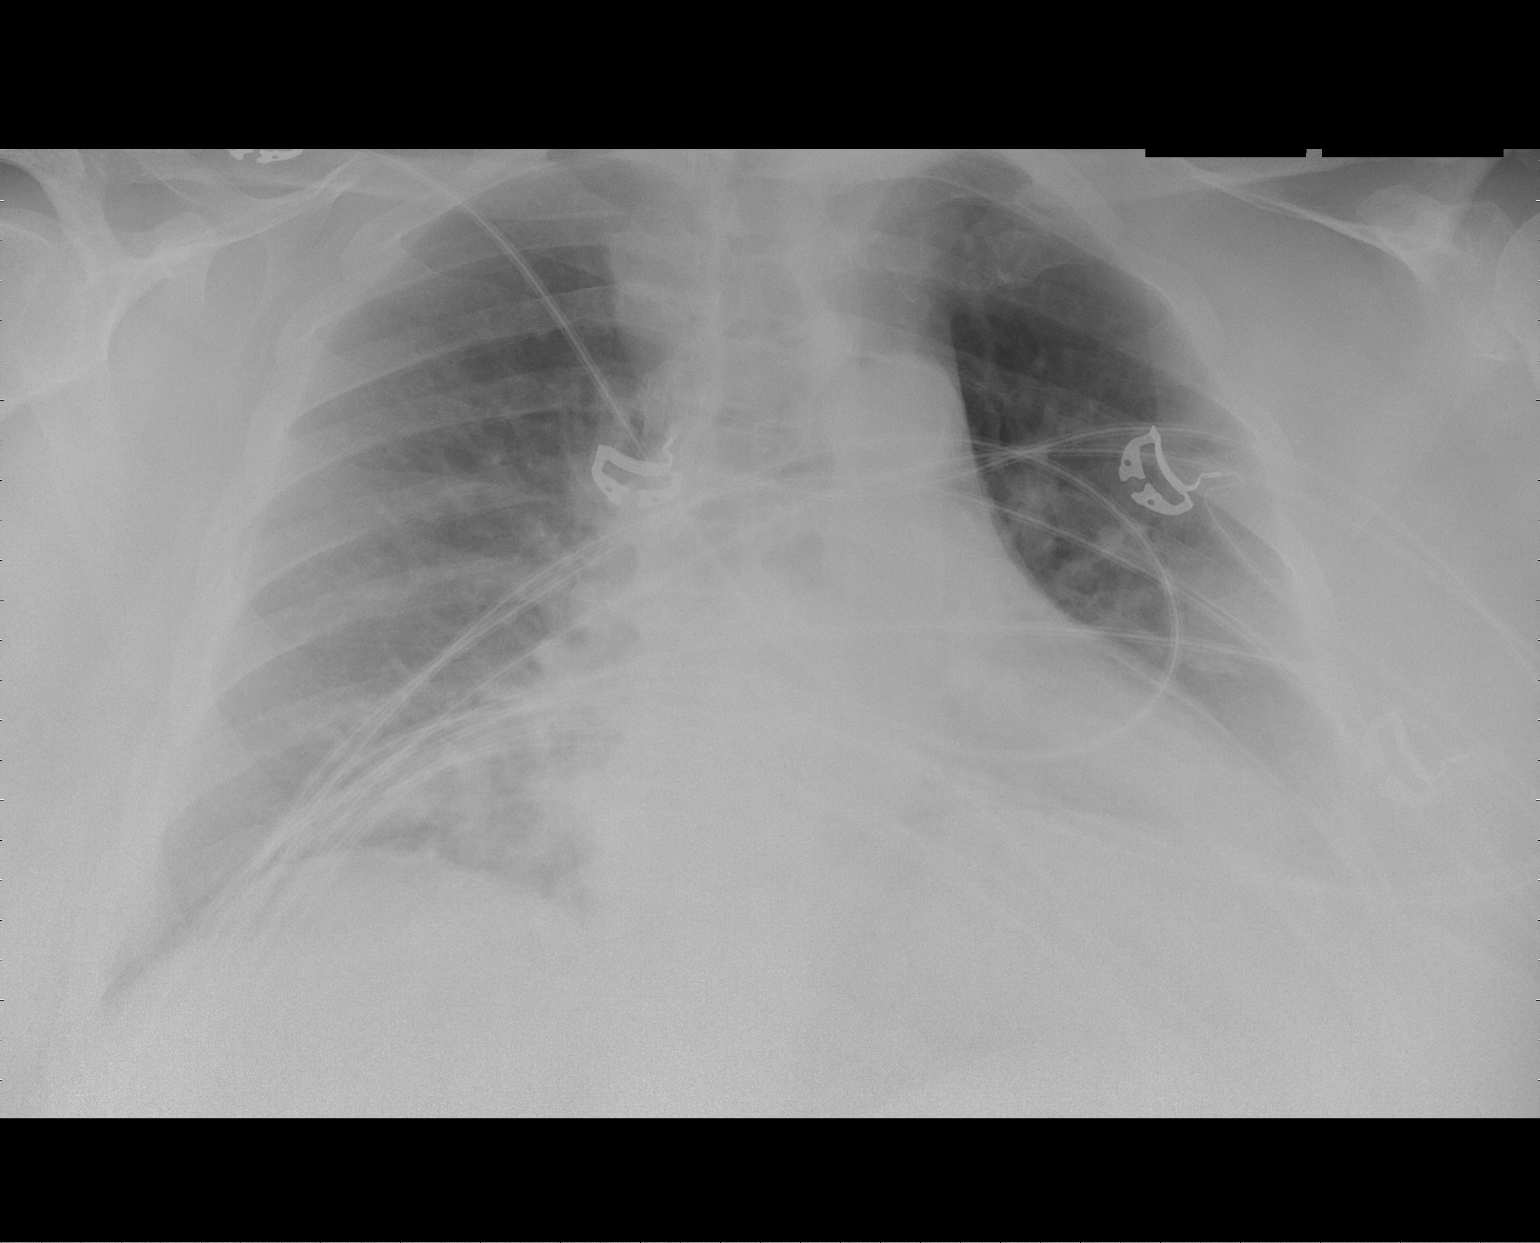

[1 of 1 positions shown; findings below may reference images not displayed]

FINDINGS: The heart is mildly enlarged.  Bibasilar opacities have
developed.  No pneumothorax.  No pleural fluid.
IMPRESSION: Bibasilar airspace opacities.  Consider bilateral pneumonia or
edema.

## 2011-08-21 IMAGING — CR DG CHEST 2V
2 series · 2 of 2 positions shown · non-contrast
Comparison: 04/12/2010

CLINICAL DATA: CHF with shortness of breath and weakness.  Edema
versus pneumonia.

CHEST - 2 VIEW

[w chest lat *]
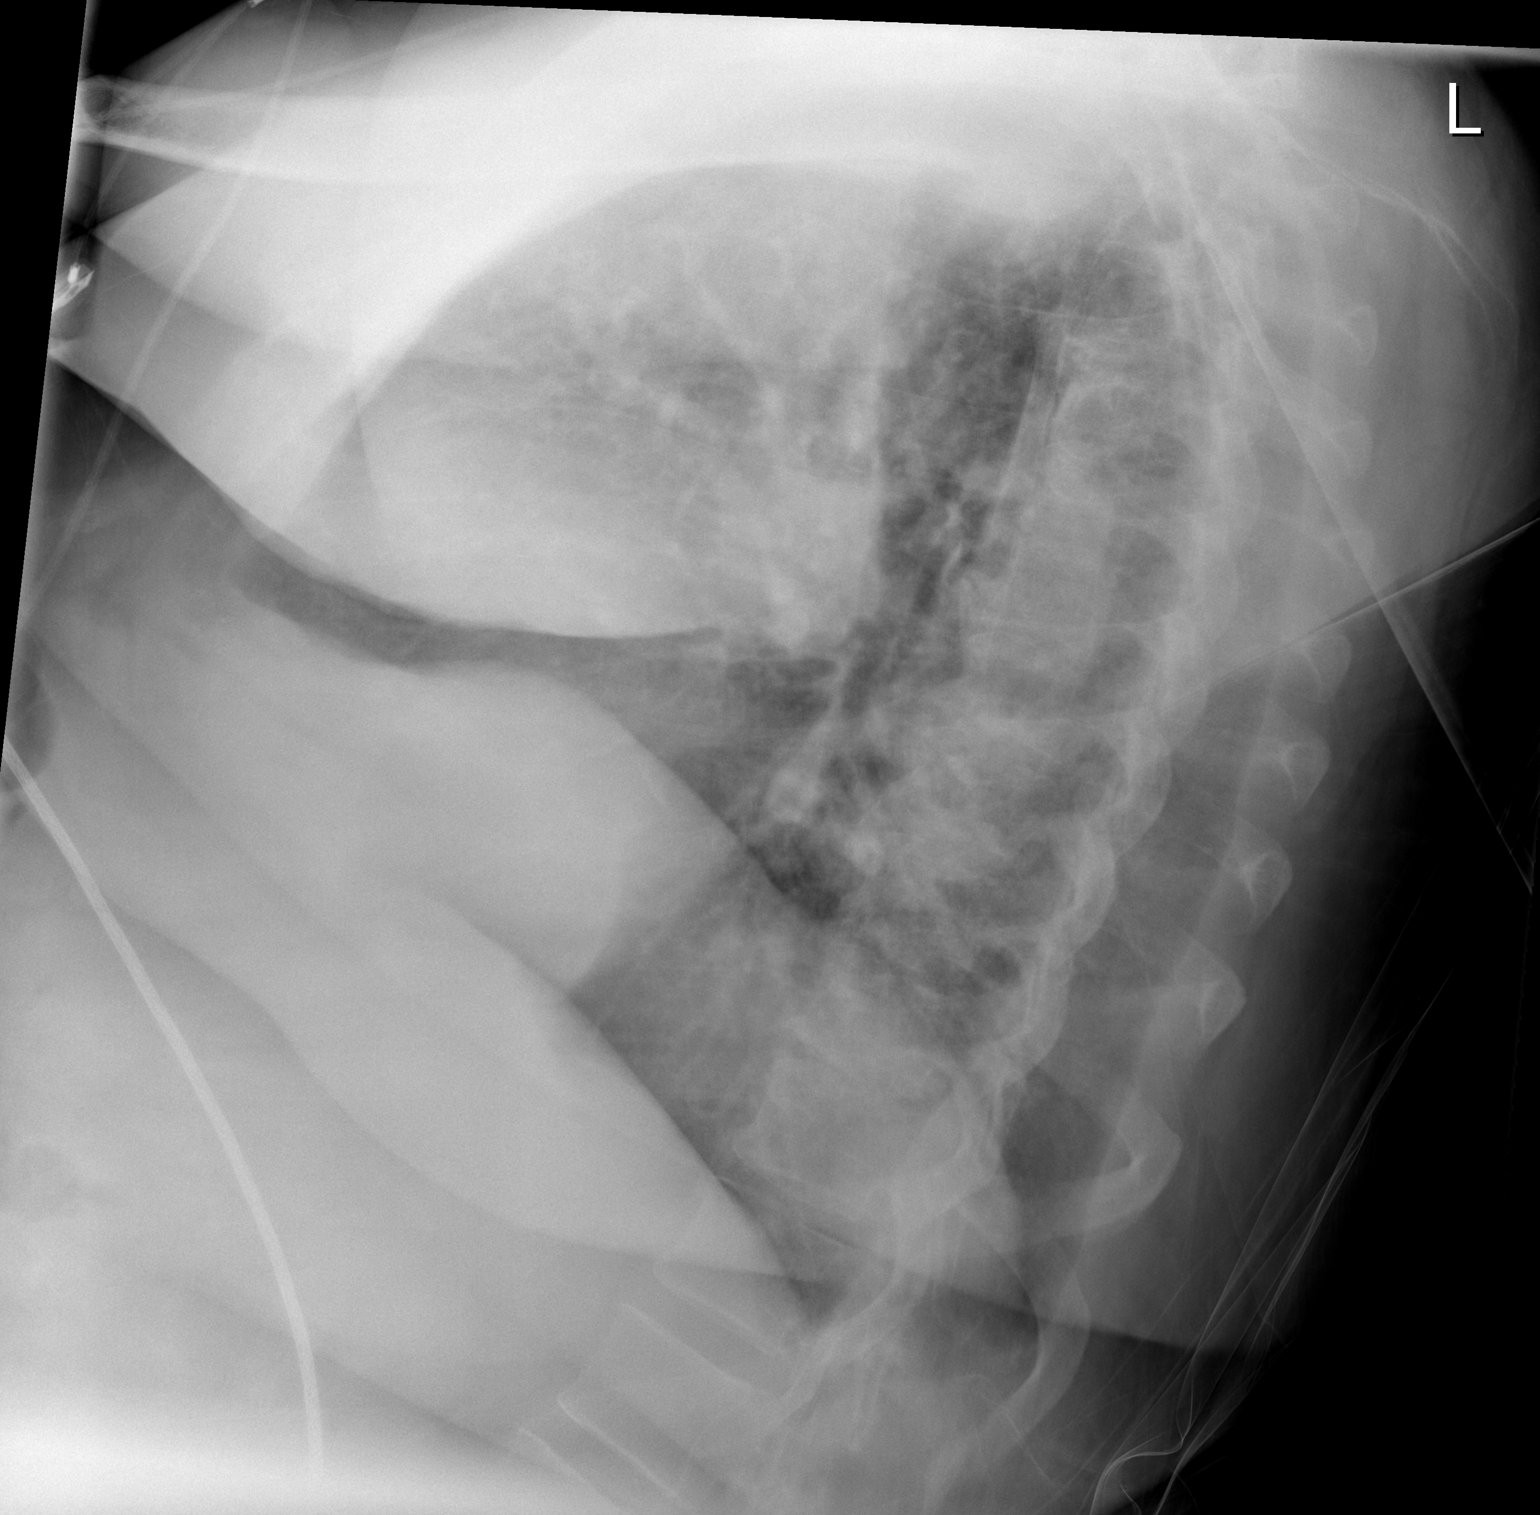

[w chest lat]
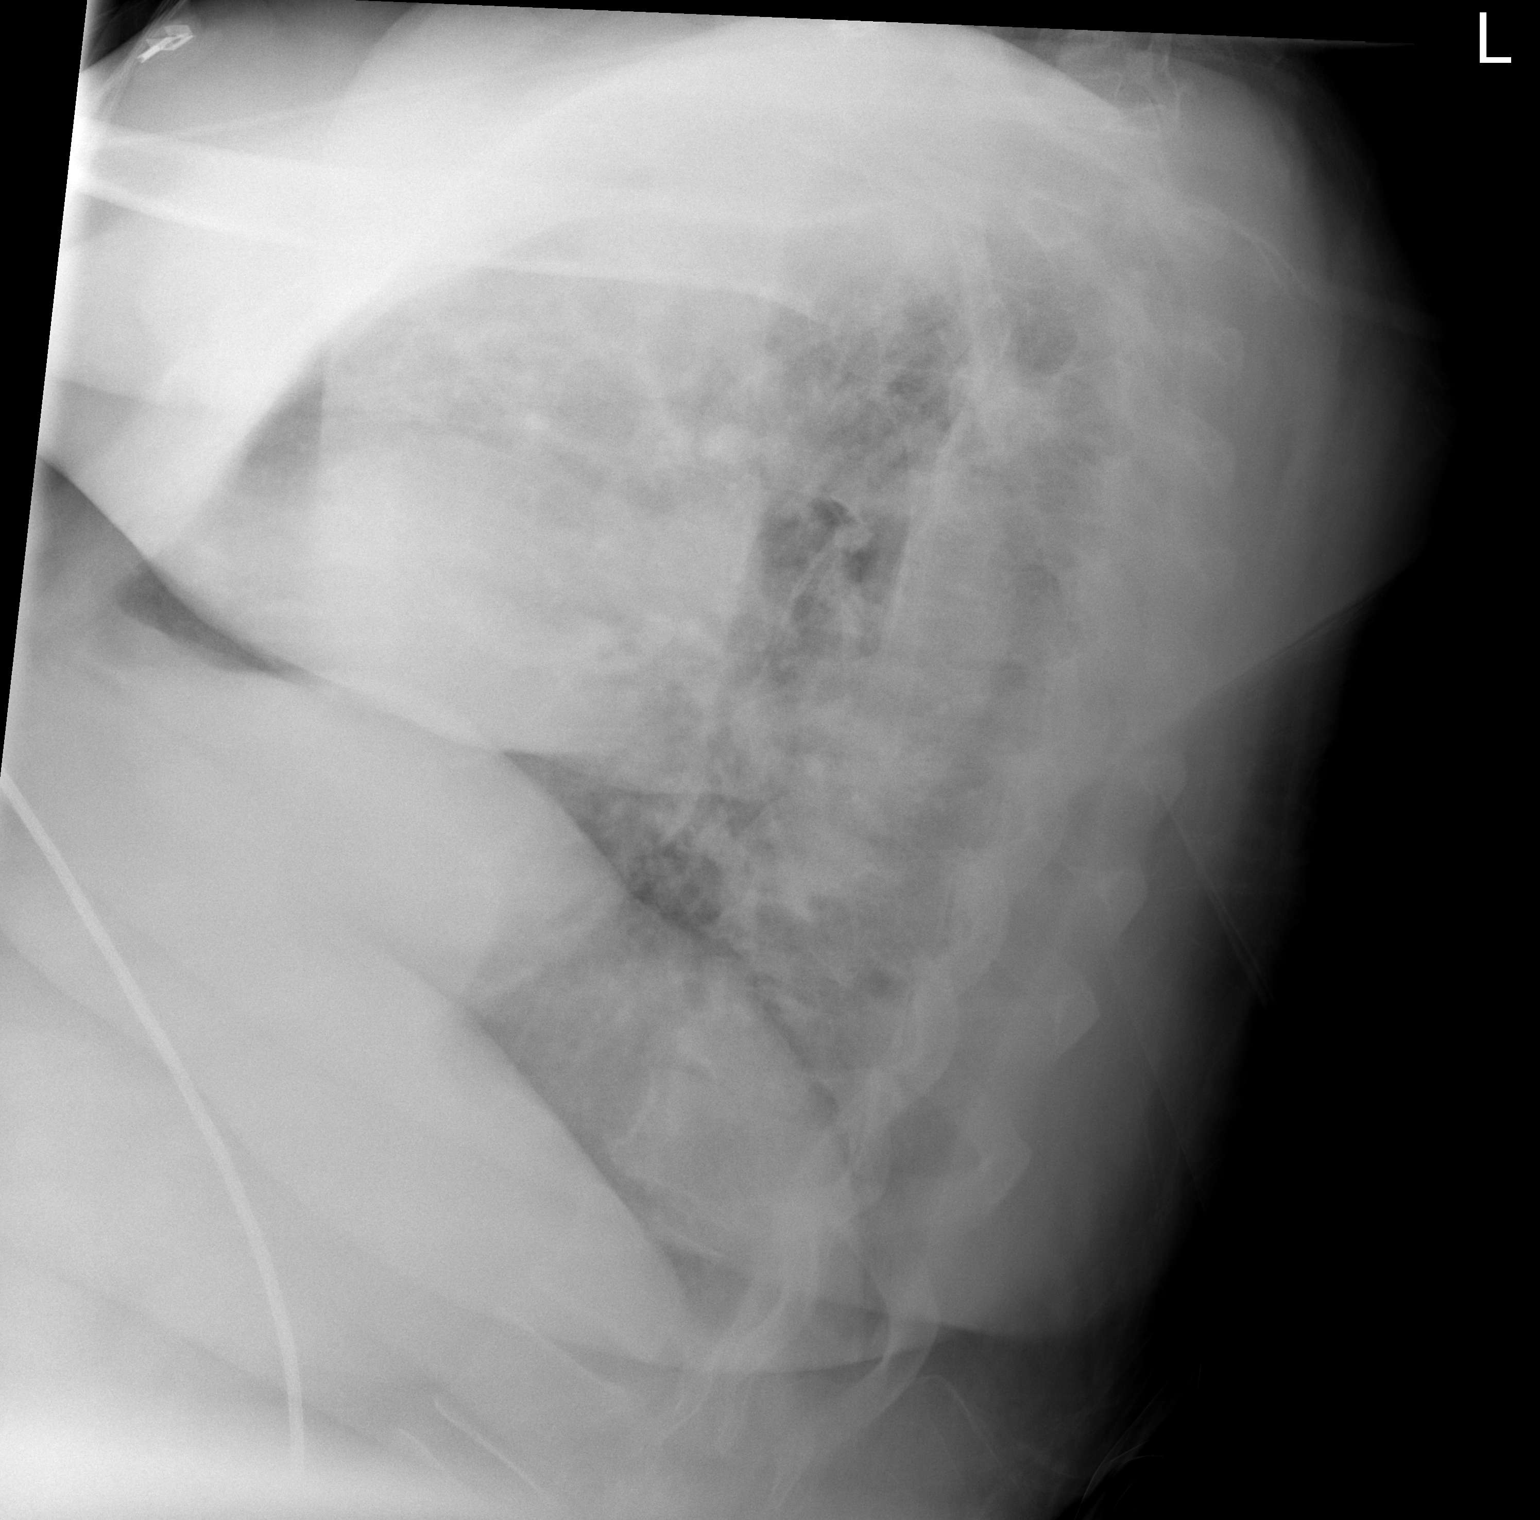

[2 of 2 positions shown; findings below may reference images not displayed]

FINDINGS: Exam limited by patient's body habitus and portable
technique.  Lung volumes remain low.  There is improved aeration of
the lung bases compared to the most recent exam.  No pleural
effusion is present.  Mild pulmonary vascular congestion is noted
without overt edema.  The upper abdomen and osseous structures are
unchanged.
IMPRESSION: Cardiomegaly with pulmonary vascular congestion.  No overt edema.
Improved aeration of the lung bases.

## 2011-08-22 ENCOUNTER — Ambulatory Visit: Payer: No Typology Code available for payment source | Admitting: Vascular Surgery

## 2011-08-23 IMAGING — NM NM PULMONARY VENT & PERF
2 series · 12 of 12 positions shown · non-contrast
Comparison: Chest radiograph 04/15/2010

CLINICAL DATA: Short of breath.  Concern for pulmonary embolism.

NUCLEAR MEDICINE VENTILATION - PERFUSION LUNG SCAN
TECHNIQUE: Wash-in, equilibrium, and wash-out phase ventilation
images were obtained using Ce-9GG gas.  Perfusion images were
obtained in multiple projections after intravenous injection of Tc-
99m MAA.
Radiopharmaceuticals:  10.2 mCi Ce-9GG gas and 6.0 mCi Jc-99m MAA.

[vq scan · 2.52mm/px · 6 of 20 frames shown (1 of 2)]
[frame 2/20  full-range]
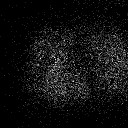
[frame 5/20]
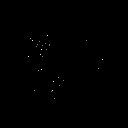
[frame 9/20]
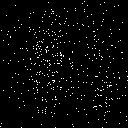
[frame 12/20]
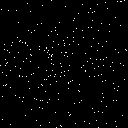
[frame 15/20]
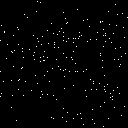
[frame 19/20]
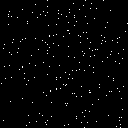

[vq scan · 2.52mm/px · 6 of 20 frames shown (2 of 2)]
[frame 2/20  full-range]
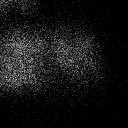
[frame 5/20]
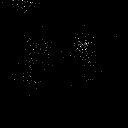
[frame 9/20]
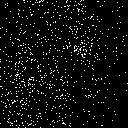
[frame 12/20]
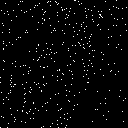
[frame 15/20]
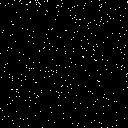
[frame 19/20]
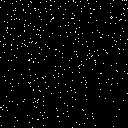

[12 of 12 positions shown; findings below may reference images not displayed]

FINDINGS: Ventilation:  No focal ventilation defect.  Not significant air
trapping.

Perfusion:  No wedge shaped peripheral perfusion defects to suggest
acute pulmonary embolism
IMPRESSION: Very low probability for acute pulmonary embolism.

## 2011-08-23 IMAGING — CR DG CHEST 2V
1 series · 1 of 1 positions shown · non-contrast
Comparison: Plain films of the chest 04/12/2010 04/13/2010.

CLINICAL DATA: Shortness of breath.  Patient for VQ scan.

CHEST - 2 VIEW

[w chest lat]
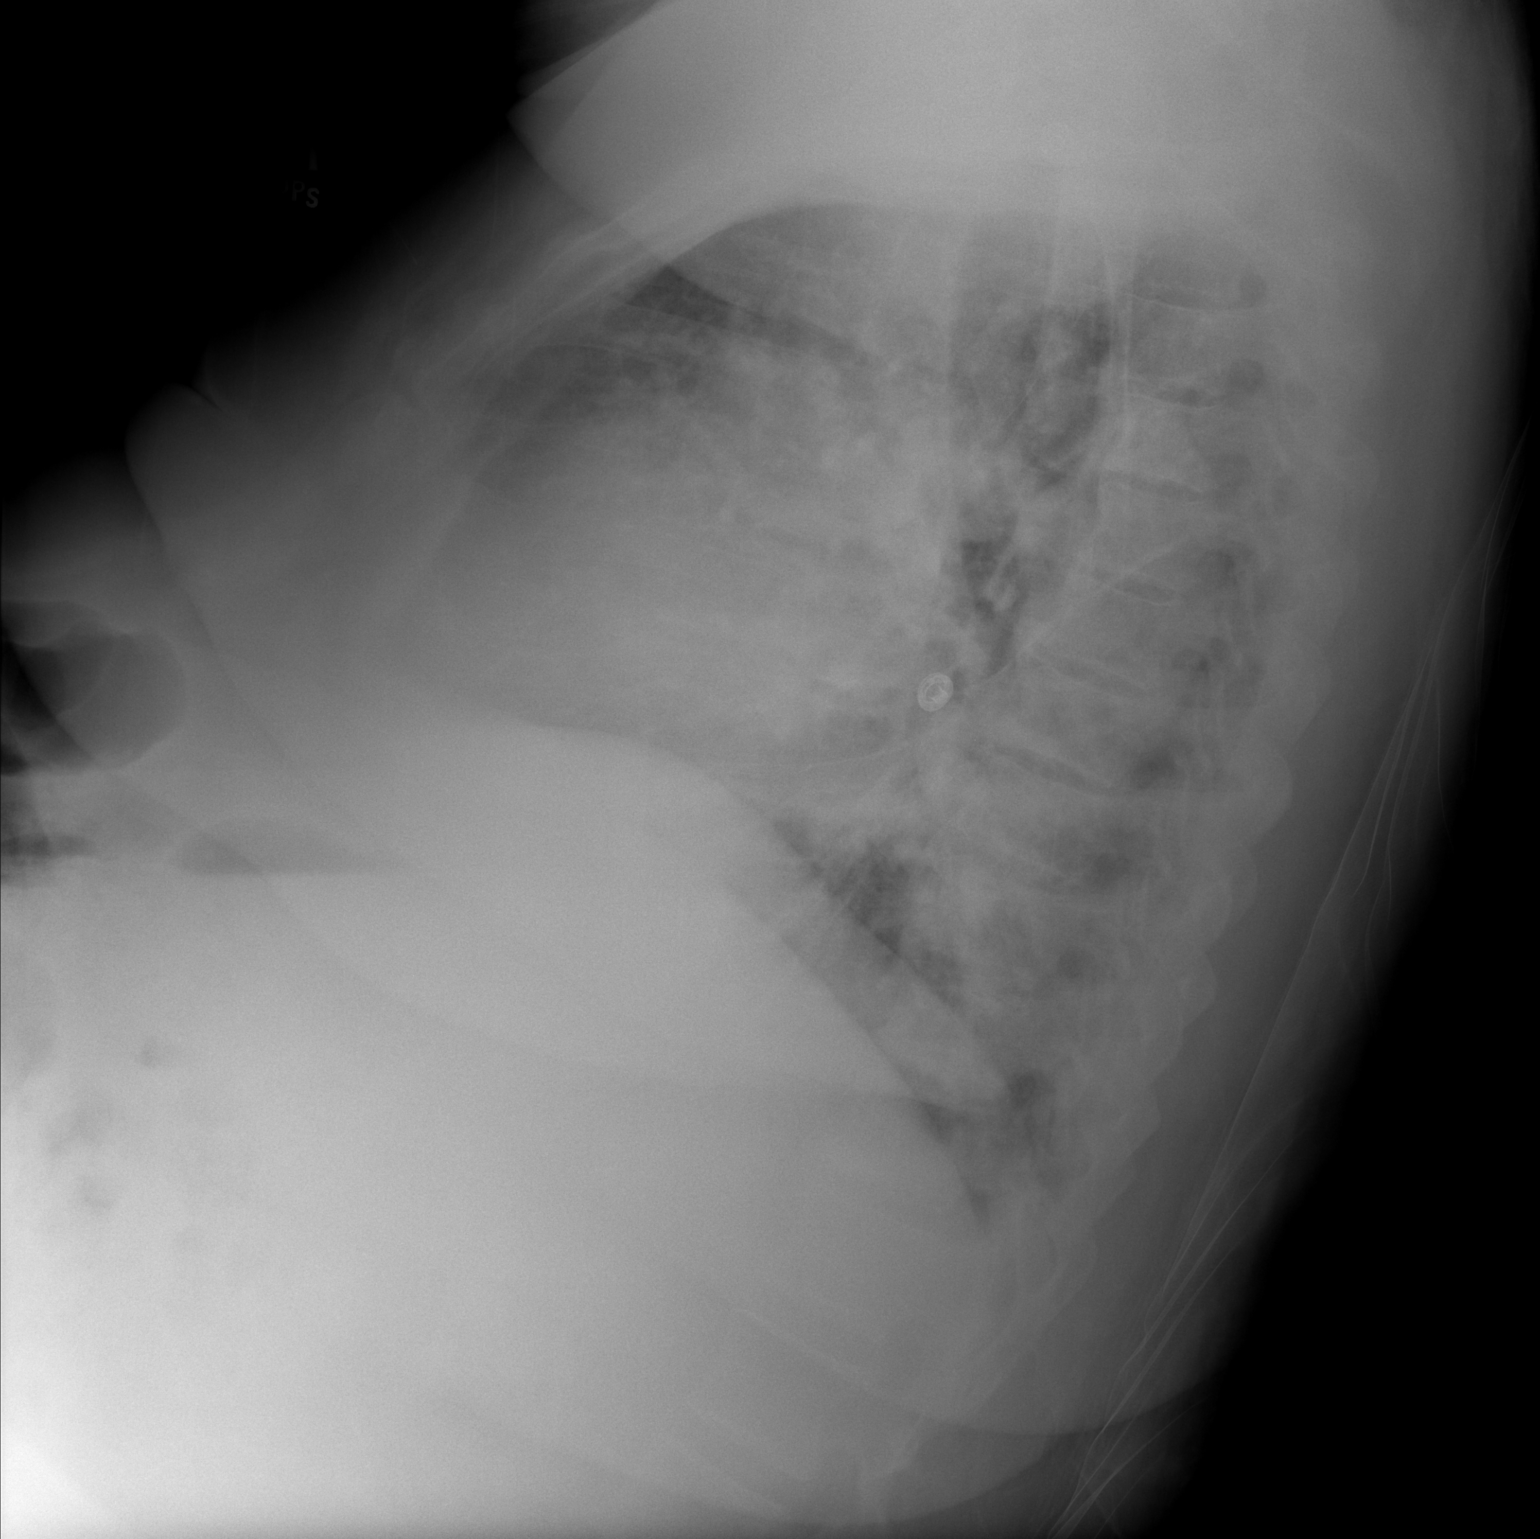

[1 of 1 positions shown; findings below may reference images not displayed]

FINDINGS: Study is limited by the patient's body habitus.  There is
cardiomegaly and pulmonary vascular congestion without frank edema.
No focal airspace disease or effusion.
IMPRESSION: Cardiomegaly and pulmonary vascular congestion.

## 2011-08-26 ENCOUNTER — Encounter (HOSPITAL_COMMUNITY)
Admission: RE | Admit: 2011-08-26 | Discharge: 2011-08-26 | Disposition: A | Discharge status: Home / Self Care | Payer: Medicare HMO | Source: Ambulatory Visit | Attending: Nephrology | Admitting: Nephrology

## 2011-08-26 DIAGNOSIS — D638 Anemia in other chronic diseases classified elsewhere: Secondary | ICD-10-CM | POA: Insufficient documentation

## 2011-08-26 DIAGNOSIS — N183 Chronic kidney disease, stage 3 unspecified: Secondary | ICD-10-CM | POA: Insufficient documentation

## 2011-08-26 LAB — RENAL FUNCTION PANEL
CO2: 22 mEq/L (ref 19–32)
Calcium: 8.8 mg/dL (ref 8.4–10.5)
GFR calc Af Amer: 7 mL/min — ABNORMAL LOW (ref >90)
Glucose, Bld: 119 mg/dL — ABNORMAL HIGH (ref 70–99)
Sodium: 145 mEq/L (ref 135–145)

## 2011-08-26 LAB — IRON AND TIBC: TIBC: 165 ug/dL — ABNORMAL LOW (ref 250–470)

## 2011-08-26 LAB — POCT HEMOGLOBIN-HEMACUE: Hemoglobin: 9.4 g/dL — ABNORMAL LOW (ref 12.0–15.0)

## 2011-08-26 IMAGING — CT CT ABD-PELV W/O CM
2 of 4 series · 17 of 46 positions shown, 19 images · non-contrast
Comparison: 12/08/2005

CLINICAL DATA: Worsening abdominal pain. Renal insufficiency.
Congestive heart failure.

CT ABDOMEN AND PELVIS WITHOUT CONTRAST
TECHNIQUE: Multidetector CT imaging of the abdomen and pelvis was
performed following the standard protocol without intravenous
contrast.

[Series 3: abd/pelv w/o 5.0 b31f st · axial · non-contrast · 0.93mm/px · z∈[-472,-32]mm · 14 of 98 slices shown, 16 images]
[im 5/98  soft-tissue]
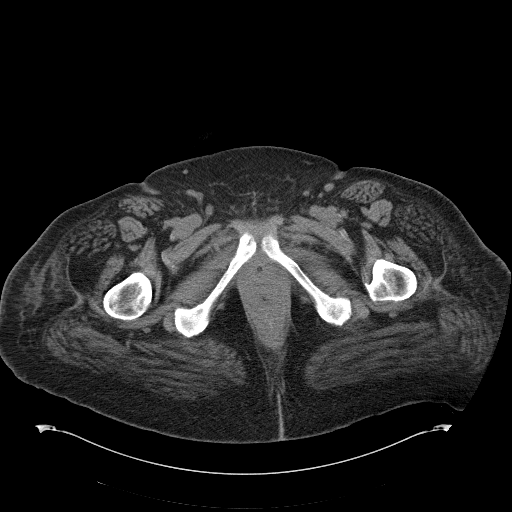
[im 5/98  bone]
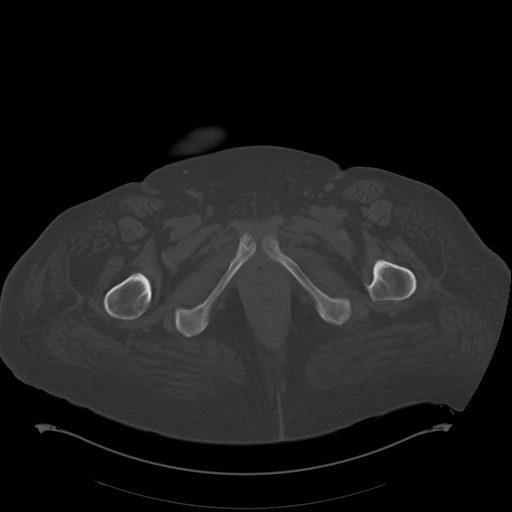
[im 13/98  soft-tissue]
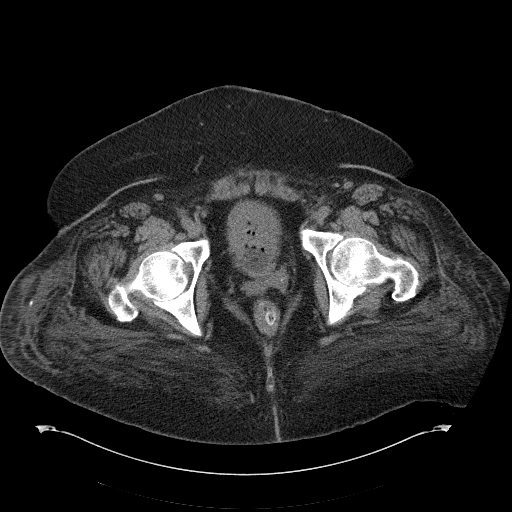
[im 21/98  soft-tissue]
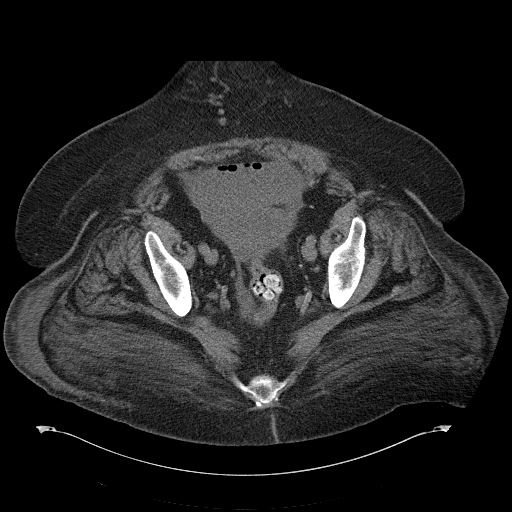
[im 25/98  soft-tissue]
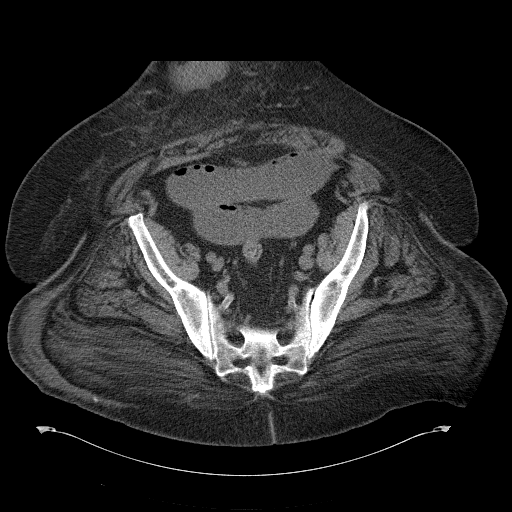
[im 33/98  soft-tissue]
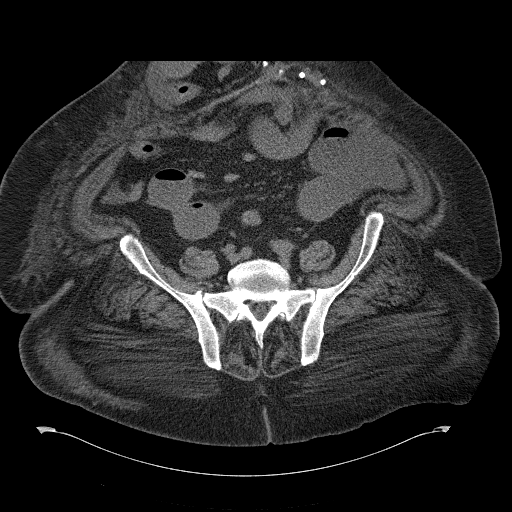
[im 41/98  soft-tissue]
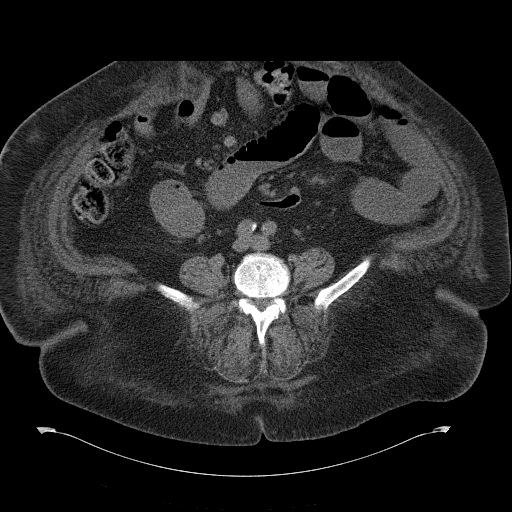
[im 45/98  soft-tissue]
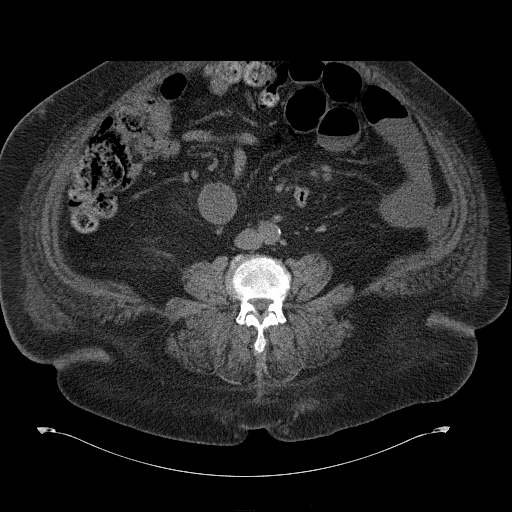
[im 53/98  soft-tissue]
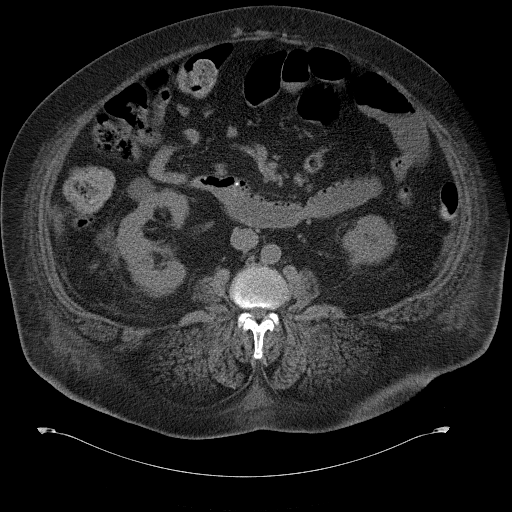
[im 57/98  soft-tissue]
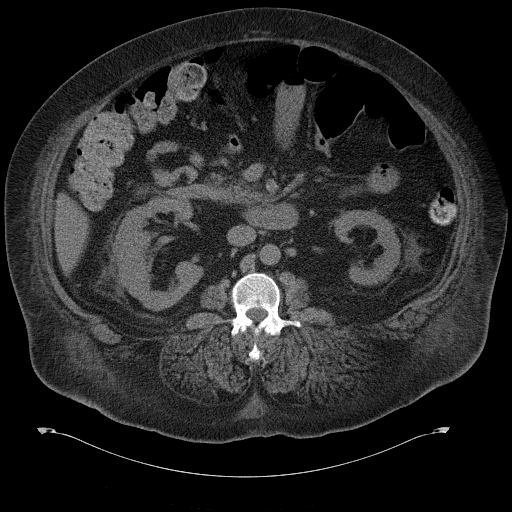
[im 57/98  bone]
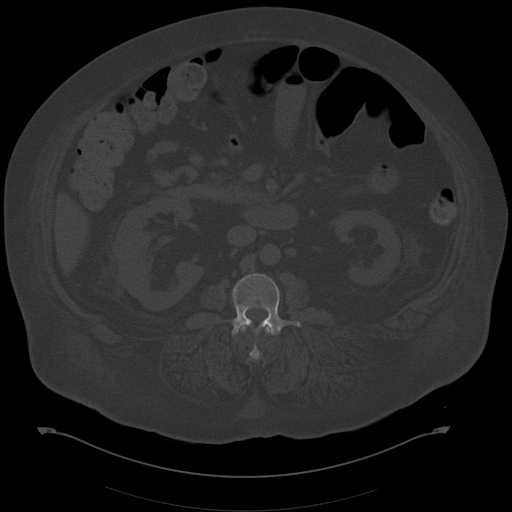
[im 65/98  soft-tissue]
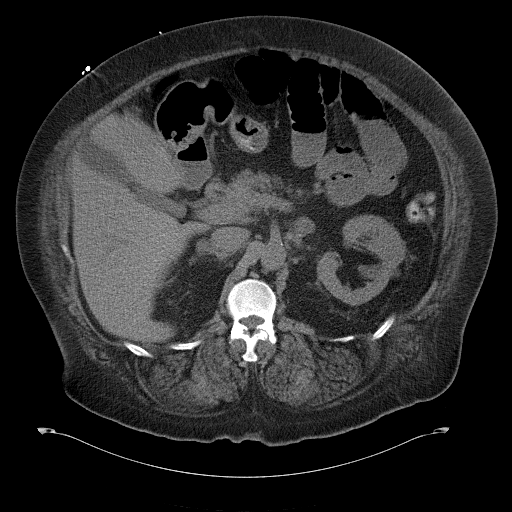
[im 73/98  soft-tissue]
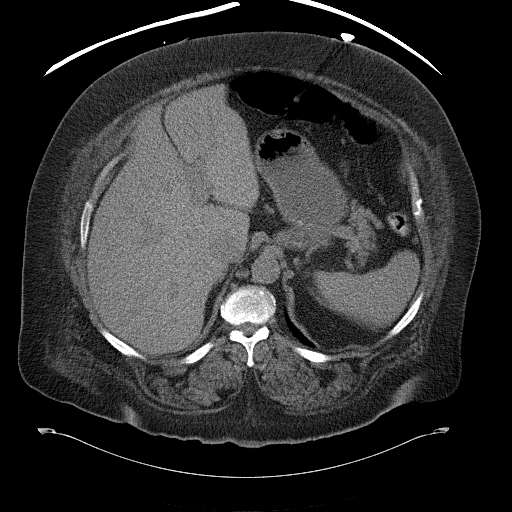
[im 77/98  soft-tissue]
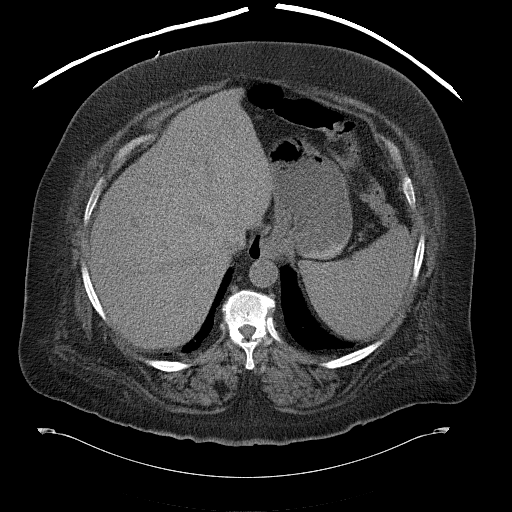
[im 85/98  soft-tissue]
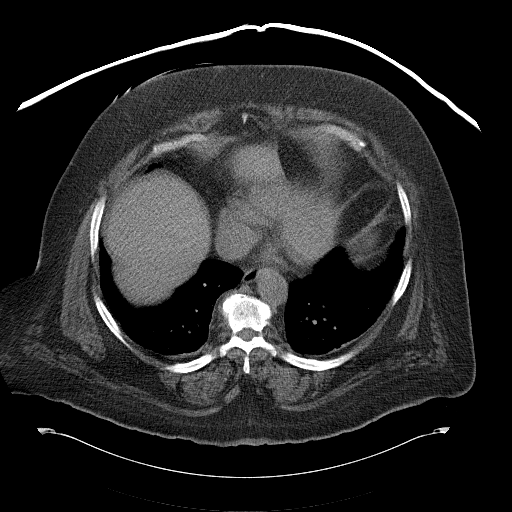
[im 93/98  soft-tissue]
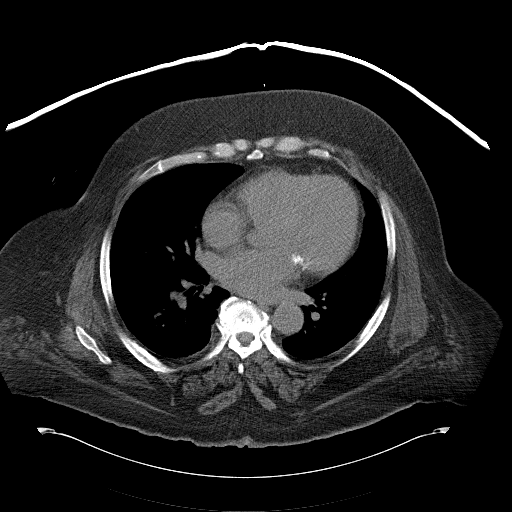

[Series 602: <mpr thick range> · coronal · 0.96mm/px · 3 of 127 slices shown]
[im 43/127  soft-tissue]
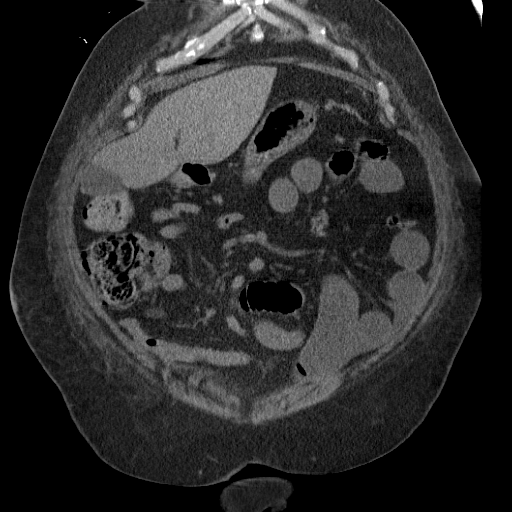
[im 57/127  soft-tissue]
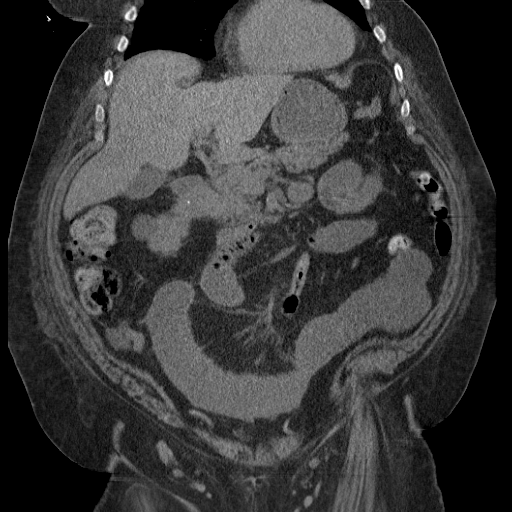
[im 71/127  soft-tissue]
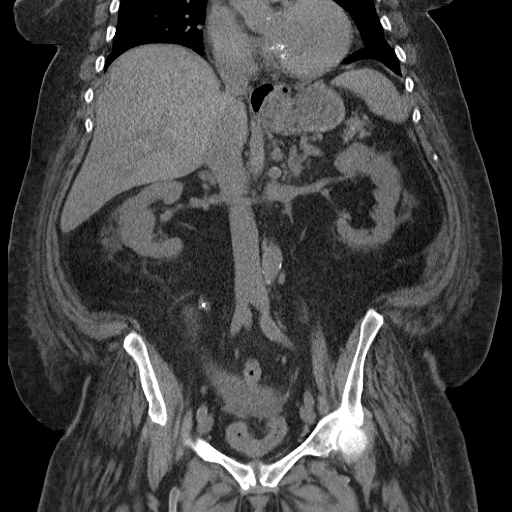

[17 of 46 positions shown; findings below may reference images not displayed]

FINDINGS: There has been surgical repair of ventral abdominal wall
hernia since prior exam.

Now seen is a recurrent right sided epigastric abdominal wall
hernia along the right lateral aspect of the surgical mesh, which
contains a distal small bowel loops.  This hernia is causing a
partial small bowel obstruction, with moderate small bowel
dilatation proximally.  There is no evidence of bowel wall
thickening or pneumatosis.

A Foley catheter is seen within the bladder which is empty.
Previous hysterectomy noted. No acute inflammatory process
identified.  No soft tissue masses are seen within the abdomen or
pelvis on this noncontrast study.  Unenhanced images of the liver,
gallbladder, spleen, pancreas, adrenal glands, and kidneys appear
normal.  No evidence of hydronephrosis.  A small hiatal hernia is
again noted.
IMPRESSION: 1.  Recurrent right paramedian ventral abdominal wall hernia along
right side of surgical mesh, containing distal small bowel.  This
hernia results in a partial distal small bowel obstruction.
2.  No evidence of bowel wall thickening or pneumatosis.   No
evidence of mass or inflammatory process.
3.  Small hiatal hernia.

Critical test results telephoned to Shiloh the patient's floor nurse
at the time of interpretation on 04/18/2010 at 9393 hours.

## 2011-08-26 MED ORDER — FERUMOXYTOL INJECTION 510 MG/17 ML
510.0000 mg | INTRAVENOUS | Status: AC
  Administered 2011-08-26: 510 mg via INTRAVENOUS

## 2011-08-26 MED ORDER — FERUMOXYTOL INJECTION 510 MG/17 ML
INTRAVENOUS | Status: AC
  Filled 2011-08-26: qty 17

## 2011-08-26 MED ORDER — EPOETIN ALFA 20000 UNIT/ML IJ SOLN
INTRAMUSCULAR | Status: AC
  Filled 2011-08-26: qty 1

## 2011-08-26 MED ORDER — SODIUM CHLORIDE 0.9 % IV SOLN
INTRAVENOUS | Status: AC
  Administered 2011-08-26: 10 mL/h via INTRAVENOUS

## 2011-08-26 MED ORDER — EPOETIN ALFA 10000 UNIT/ML IJ SOLN: 30000.0000 [IU] | INTRAMUSCULAR | Status: DC

## 2011-08-26 MED ORDER — EPOETIN ALFA 10000 UNIT/ML IJ SOLN
INTRAMUSCULAR | Status: AC
  Filled 2011-08-26: qty 1

## 2011-08-28 ENCOUNTER — Encounter: Payer: Self-pay | Admitting: Vascular Surgery

## 2011-08-28 IMAGING — CR DG ABDOMEN 2V
4 series · 4 of 4 positions shown · non-contrast
Comparison: 04/18/2010

CLINICAL DATA: Abdominal pain, obstruction

ABDOMEN - 2 VIEW

[w abdomen decub]
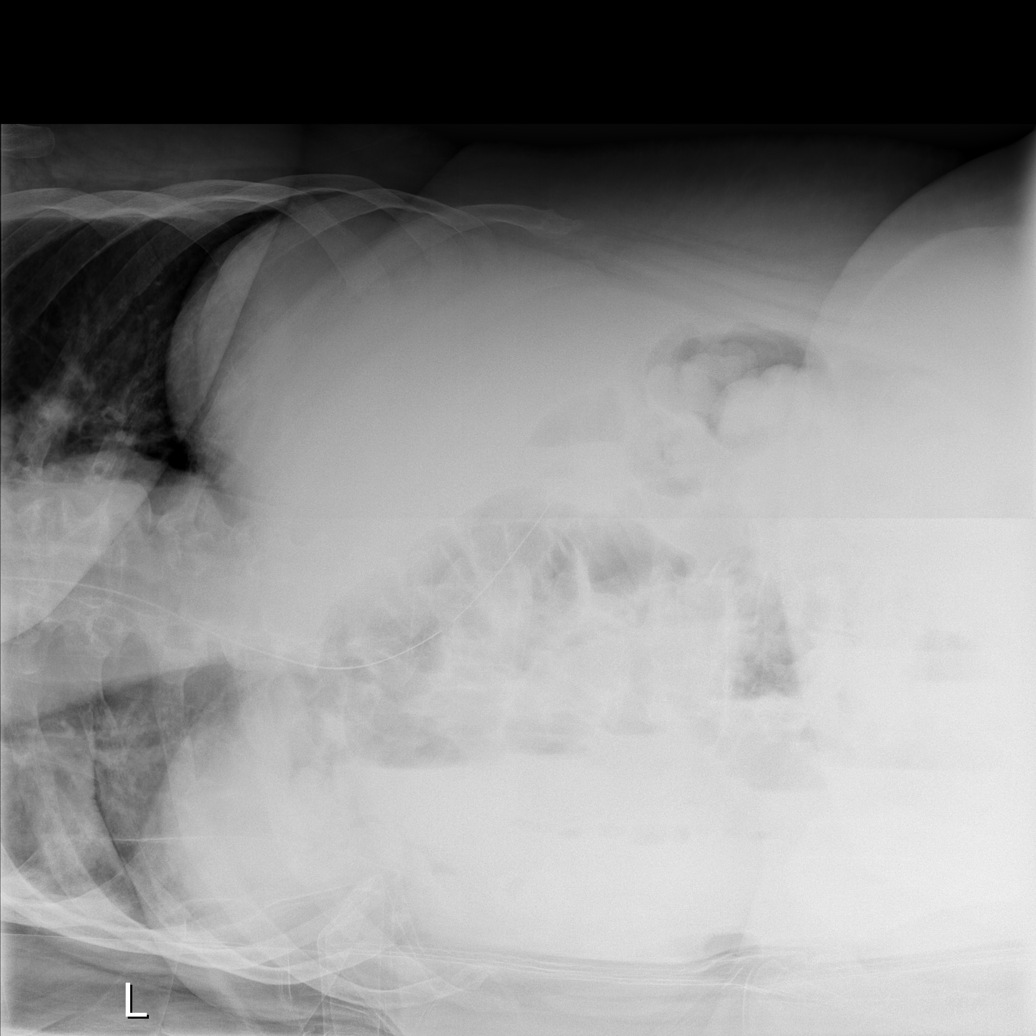

[t abdomen supine (1 of 3)]
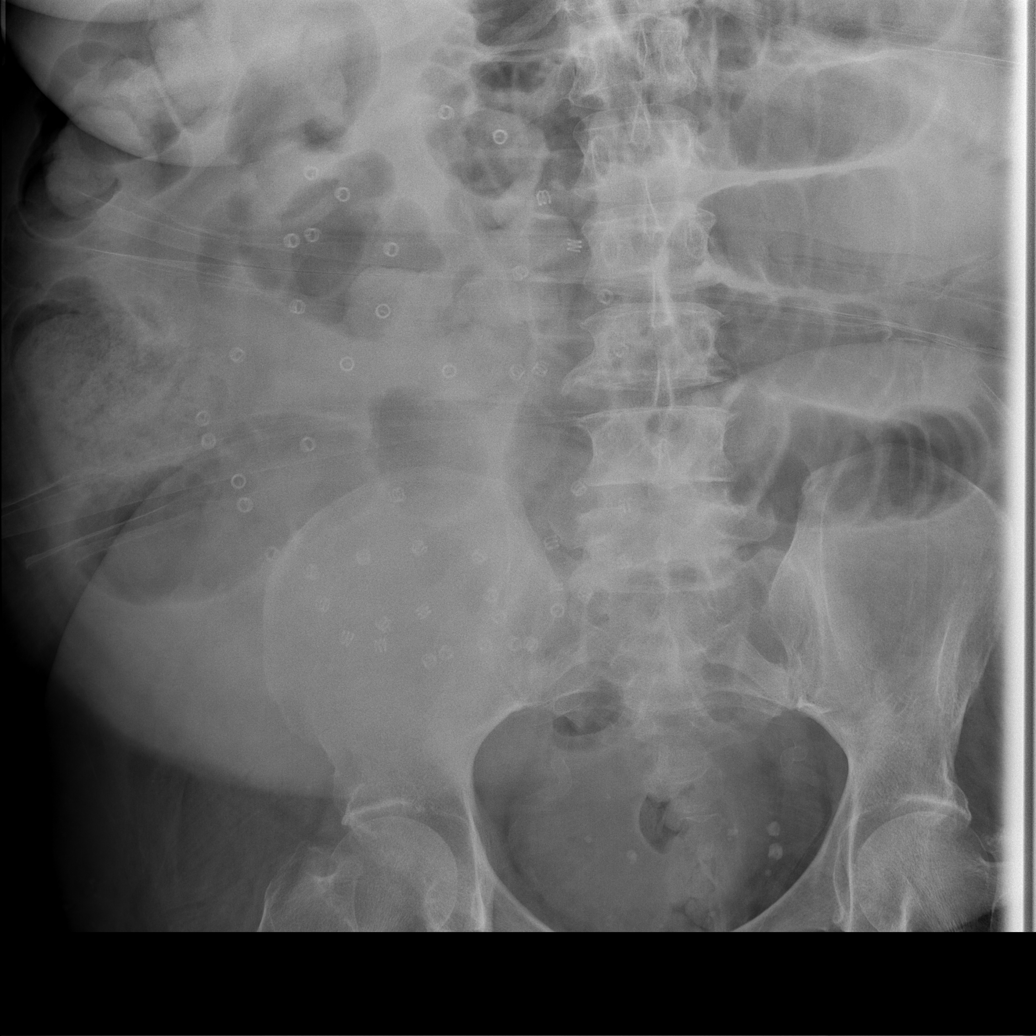

[t abdomen supine (2 of 3)]
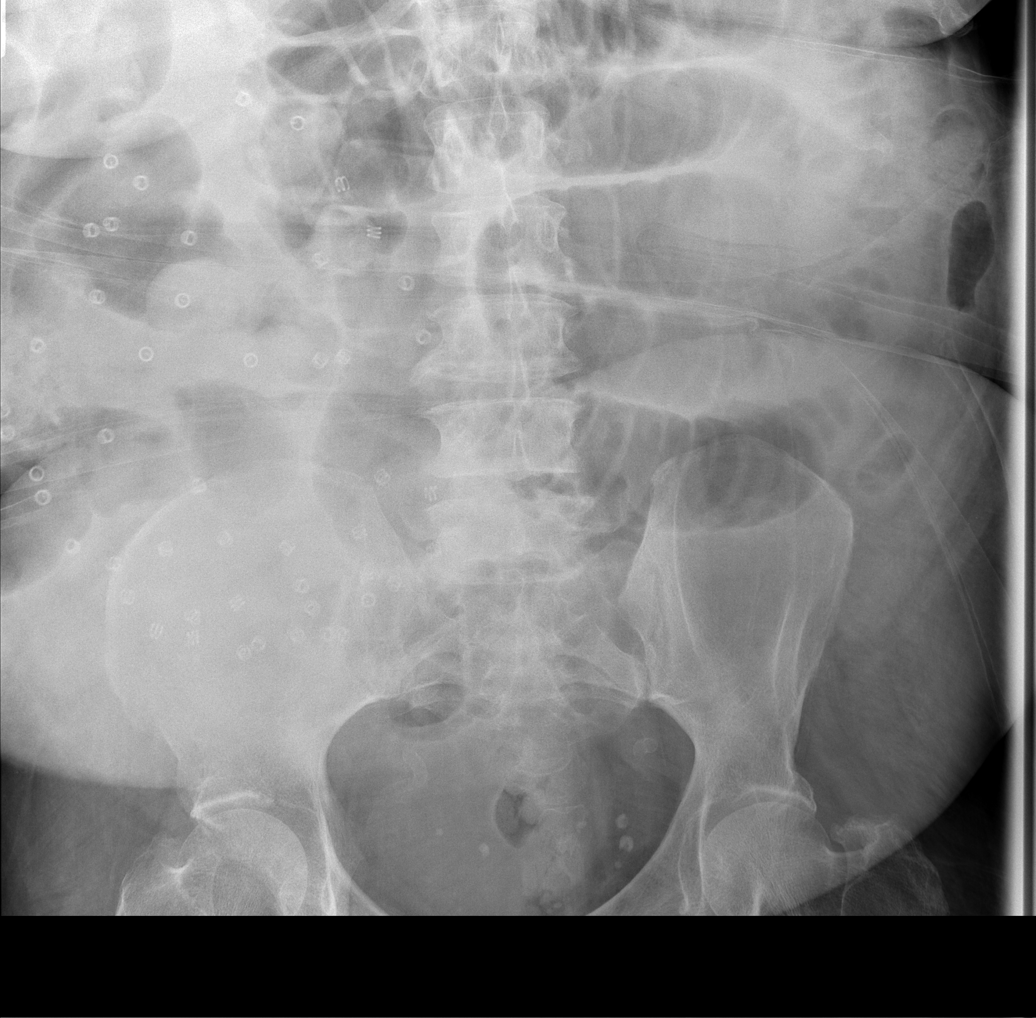

[t abdomen supine (3 of 3)]
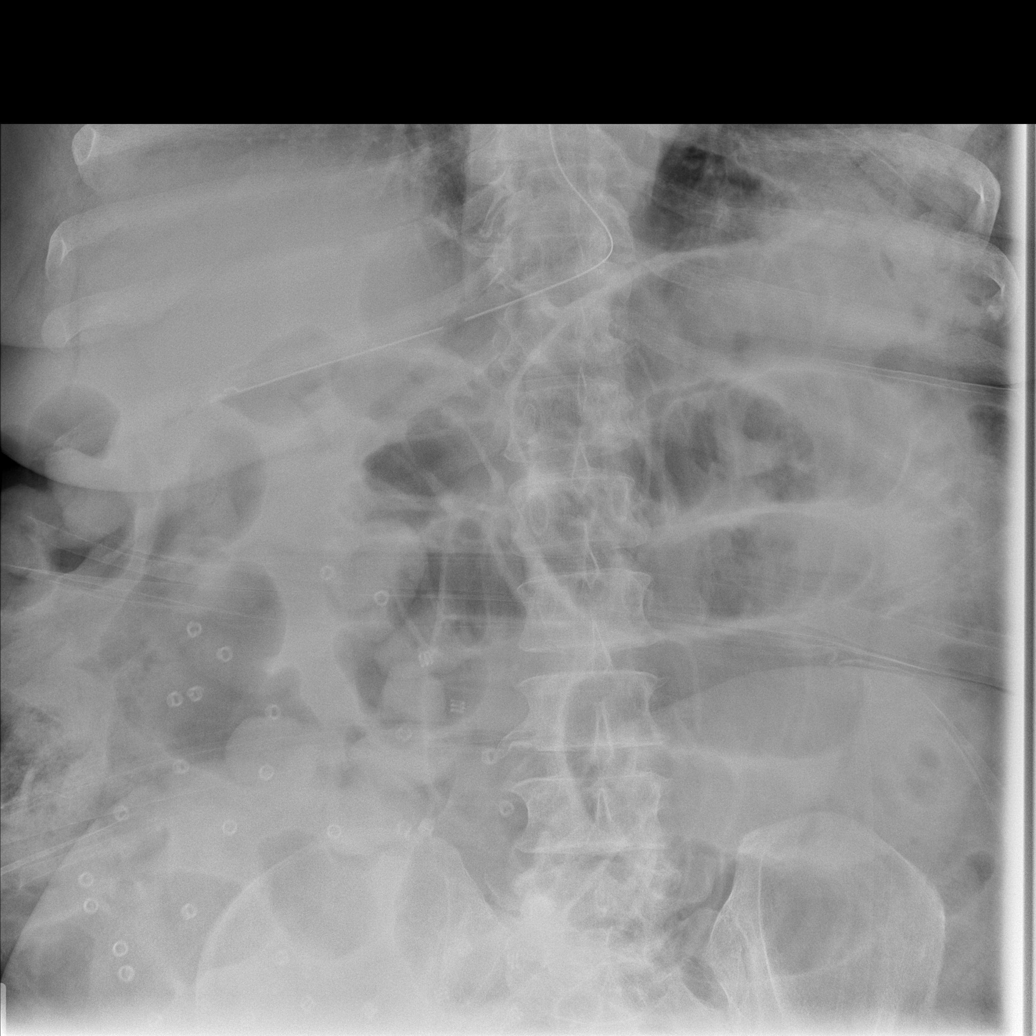

[4 of 4 positions shown; findings below may reference images not displayed]

FINDINGS: Interval NG tube insertion into the stomach.  Numerous
dilated loops of small bowel, 5 cm diameter in the left upper
quadrant.  Decubitus view demonstrates several small bowel
associated air fluid levels.  No definite free air.  There is some
air in the right colon.  Postop changes of the abdominal wall.
Pelvic calcifications are likely vascular.
IMPRESSION: No significant change in small bowel obstruction pattern.

## 2011-08-28 MED FILL — Epoetin Alfa Inj 10000 Unit/ML: INTRAMUSCULAR | Qty: 1 | Status: AC

## 2011-08-28 MED FILL — Epoetin Alfa Inj 20000 Unit/ML: INTRAMUSCULAR | Qty: 1 | Status: AC

## 2011-08-29 ENCOUNTER — Encounter: Payer: Self-pay | Admitting: Vascular Surgery

## 2011-08-29 ENCOUNTER — Ambulatory Visit (INDEPENDENT_AMBULATORY_CARE_PROVIDER_SITE_OTHER): Payer: Medicare HMO | Admitting: Vascular Surgery

## 2011-08-29 ENCOUNTER — Encounter (INDEPENDENT_AMBULATORY_CARE_PROVIDER_SITE_OTHER): Payer: Medicare HMO | Admitting: *Deleted

## 2011-08-29 VITALS — BP 149/80 | HR 77 | Resp 16 | Ht 65.0 in | Wt 278.0 lb

## 2011-08-29 DIAGNOSIS — N184 Chronic kidney disease, stage 4 (severe): Secondary | ICD-10-CM

## 2011-08-29 DIAGNOSIS — N186 End stage renal disease: Secondary | ICD-10-CM

## 2011-08-29 DIAGNOSIS — Z01810 Encounter for preprocedural cardiovascular examination: Secondary | ICD-10-CM

## 2011-08-29 NOTE — Progress Notes (Signed)
[facility] [place]  Referred by:  Royetta Crochet. [address], [address] [place], [place] [address]  Reason for referral: New access  History of Present Illness  [name] is a 66 y.o. (Mar 31, 1945) female who presents for evaluation for permanent access.  The patient is right hand dominant.  The patient has not had previous access procedures.  Previous central venous cannulation procedures include: none.  The patient has never had a PPM placed.   She is not certain how close she is to hemodialysis  Past Medical History  Diagnosis Date  . Arthritis   . Abdominal abscess 12-17-10    abdominal abscesses x2 ? one at this time  . Staph aureus infection November 2012   . Myocardial infarction 12-17-10    80's-abnormal testing showed  . Swelling of both ankles 12-17-10  . Blood transfusion, without reported diagnosis 12-17-10    "thinks in [facility]  . Chronic kidney disease   . Chronic renal insufficiency 2009    sees Dr. Florene Glen  every 4-5 mo  . Anemia   . Diabetes mellitus     type 2 IDDM x 6-7 years  . Sleep apnea 2012  . Coronary artery disease     Dr Kadakia-cardiologist 2-3 x year  . Dysrhythmia   . CHF (congestive heart failure)   . Hypertension   . Diabetes mellitus     Past Surgical History  Procedure Date  . Ventral hernia repair     component separation, repair with biologic  . Vaginal hysterectomy   . Cardiac catheterization 12-17-10    '80's  . Abdominal hysterectomy 12-17-10    Vaginal Hysterectomy  . Cataract extraction w/ intraocular lens implant 12-17-10    bilateral  . Laparotomy 12/18/2010    Procedure: EXPLORATORY LAPAROTOMY;  Surgeon: Rolm Bookbinder, MD;  Location: [facility];  Service: General;  Laterality: N/A;  Abdominal Seroma Evacuation  . Back surgery   . Lumbar disc surgery 1970'-80's    X 3  . Eye surgery   . Wound debridement 03/20/2011    Procedure: DEBRIDEMENT ABDOMINAL WOUND;  Surgeon:  Rolm Bookbinder, MD;  Location: [facility];  Service: General;  Laterality: N/A;  debridement abdominal wall, placement of wound vac  . Abdominal hysterectomy   . Cataract extraction, bilateral     History   Social History  . Marital Status: Widowed    Spouse Name: N/A    Number of Children: N/A  . Years of Education: N/A   Occupational History  . Not on file.   Social History Main Topics  . Smoking status: Former Smoker    Types: Cigarettes    Quit date: 12/16/1980  . Smokeless tobacco: Never Used  . Alcohol Use: No  . Drug Use: No  . Sexually Active: No   Other Topics Concern  . Not on file   Social History Narrative   ** Merged History Encounter **     Family History  Problem Relation Age of Onset  . Hypertension Mother   . Cancer Brother     spine  . Cancer Sister     brain  . Hypertension Maternal Grandmother   . Anesthesia problems Neg Hx     Current Outpatient Prescriptions on File Prior to Visit  Medication Sig Dispense Refill  . ALBUTEROL IN Inhale into the lungs 3 (three) times daily.      Marland Kitchen amLODipine (NORVASC) 10 MG tablet Take 10 mg by mouth every morning.       Marland Kitchen  aspirin EC 81 MG tablet Take 81 mg by mouth daily.        . calcitRIOL (ROCALTROL) 0.25 MCG capsule Take 0.25 mcg by mouth daily.      . Calcium Carbonate-Vitamin D (CALCIUM 600 + D PO) Take 1,500 mg by mouth daily.      . carvedilol (COREG) 25 MG tablet Take 25 mg by mouth 2 (two) times daily.      . cloNIDine (CATAPRES) 0.3 MG tablet Take 0.3 mg by mouth 3 (three) times daily.      . ferrous sulfate 325 (65 FE) MG tablet Take 325 mg by mouth daily with breakfast.        . furosemide (LASIX) 80 MG tablet Take 40-80 mg by mouth daily. Take 80mg  (1 tablet) every morning and take 40mg  ( tablet) every evening.      Marland Kitchen HYDROcodone-acetaminophen (NORCO) 10-325 MG per tablet Take 1-2 tablets by mouth every 6 (six) hours as needed. For pain      . HYDROmorphone (DILAUDID) 2 MG tablet Take 1 tablet  (2 mg total) by mouth every 12 (twelve) hours as needed for pain.  20 tablet  0  . insulin glargine (LANTUS) 100 UNIT/ML injection Inject 10 Units into the skin at bedtime.       . insulin lispro (HUMALOG) 100 UNIT/ML injection Inject 5 Units into the skin 3 (three) times daily before meals.      . isosorbide mononitrate (IMDUR) 60 MG 24 hr tablet Take 60 mg by mouth daily.       . Multiple Vitamin (MULITIVITAMIN WITH MINERALS) TABS Take 1 tablet by mouth daily.      Marland Kitchen oxyCODONE-acetaminophen (PERCOCET) 10-325 MG per tablet Take 1 tablet by mouth every 6 (six) hours as needed for pain.  20 tablet  0  . pioglitazone (ACTOS) 15 MG tablet Take 15 mg by mouth daily.      . rosuvastatin (CRESTOR) 20 MG tablet Take 20 mg by mouth daily.      Marland Kitchen atorvastatin (LIPITOR) 40 MG tablet Take 40 mg by mouth daily.      . calcium carbonate 200 MG capsule Take 200 mg by mouth daily.       . cloNIDine (CATAPRES) 0.2 MG tablet Take 0.2 mg by mouth daily.        . colchicine 0.6 MG tablet Take 0.6 mg by mouth daily.      Marland Kitchen doxycycline (VIBRA-TABS) 100 MG tablet Take 100 mg by mouth 2 (two) times daily.      . hydrALAZINE (APRESOLINE) 50 MG tablet       . INSULIN REGULAR HUMAN IJ Inject 1-9 Units as directed 3 (three) times daily. Per Sliding scale      . lisinopril (PRINIVIL,ZESTRIL) 10 MG tablet Take 10 mg by mouth daily.      . metoprolol (TOPROL-XL) 50 MG 24 hr tablet Take 50 mg by mouth every morning.       . metoprolol succinate (TOPROL-XL) 50 MG 24 hr tablet Take 50 mg by mouth daily. Take with or immediately following a meal.      . mupirocin ointment (BACTROBAN) 2 % Apply 1 application topically 2 (two) times daily. Apply to bilateral nares for positive PCR      . NITROSTAT 0.4 MG SL tablet Take 0.4 mg by mouth as needed.      . Nutritional Supplements (GLUCERNA MEAL PO) Take 237 mLs by mouth daily.       . pantoprazole (PROTONIX)  40 MG tablet Take 40 mg by mouth daily.      Marland Kitchen DISCONTD: amLODipine  (NORVASC) 5 MG tablet Take 5 mg by mouth daily.      Marland Kitchen DISCONTD: ferrous sulfate 325 (65 FE) MG tablet Take 325 mg by mouth daily with breakfast.      . DISCONTD: furosemide (LASIX) 80 MG tablet Take 80 mg by mouth 2 (two) times daily.      Marland Kitchen DISCONTD: isosorbide mononitrate (IMDUR) 60 MG 24 hr tablet Take 60 mg by mouth daily.      Marland Kitchen DISCONTD: rosuvastatin (CRESTOR) 20 MG tablet Take 20 mg by mouth daily.          No Known Allergies  REVIEW OF SYSTEMS:  (Positives indicated with an "x", otherwise negative)  CARDIOVASCULAR: [ ]  chest pain    [ ]  chest pressure    [ ]  palpitations    [x]  orthopnea   [ ]  dyspnea on exert. [ ]  claudication    [ ]  rest pain     [ ]  DVT     [x]  swelling in legs  PULMONARY:    [ ]  productive cough [ ]  asthma  [ ]  wheezing  NEUROLOGIC:    [x]  weakness in arms or legs   [ ]  paresthesias   [ ]  aphasia    [x]  temporary loss of vision in eye [x]  dizziness  HEMATOLOGIC:    [ ]  bleeding problems  [ ]  clotting disorders  MUSCULOSKEL: [ ]  joint pain     [ ]  joint swelling  GASTROINTEST:  [ ]   blood in stool   [ ]   hematemesis  GENITOURINARY:   [ ]   dysuria    [ ]   hematuria  PSYCHIATRIC:   [ ]  history of major depression  INTEGUMENTARY: [ ]  rashes    [ ]  ulcers  CONSTITUTIONAL:  [ ]  fever     [ ]  chills  Physical Examination  Filed Vitals:   08/29/11 1149  BP: 149/80  Pulse: 77  Resp: 16  Height: 5\' 5"  (1.651 m)  Weight: 278 lb (126.1 kg)  SpO2: 100%   Body mass index is 46.26 kg/(m^2).  General: A&O x 3, WDWN, obese  Head: [place]/AT  Ear/Nose/Throat: Hearing grossly intact, nares w/o erythema or drainage, oropharynx w/o Erythema/Exudate  Eyes: PERRLA, EOMI  Neck: Supple, no nuchal rigidity, no palpable LAD  Pulmonary: Sym exp, good air movt, CTAB, no rales, rhonchi, & wheezing  Cardiac: RRR, Nl S1, S2, no Murmurs, rubs or gallops  Vascular: Vessel Right Left  Radial Palpable Palpable  Brachial Palpable Palpable  Carotid Palpable, without  bruit Palpable, without bruit  Aorta Non-palpable N/A  Femoral Palpable Palpable  Popliteal Non-palpable Non-palpable  PT Palpable Palpable  DP Palpable Palpable   Gastrointestinal: soft, NTND, -G/R, - HSM, - masses, - CVAT B, post-surgical wound  Musculoskeletal: M/S 5/5 throughout , Extremities without ischemic changes   Neurologic: CN 2-12 intact , Pain and light touch intact in extremities , Motor exam as listed above  Psychiatric: Judgment intact, Mood & affect appropriate for pt's clinical situation  Dermatologic: See M/S exam for extremity exam, no rashes otherwise noted  Lymph : No Cervical, Axillary, or Inguinal lymphadenopathy   Non-Invasive Vascular Imaging  Vein Mapping  (Date: 08/29/11):   R arm: acceptable vein conduits include none  L arm: acceptable vein conduits include upper arm basilic and cephalic vein  Outside Studies/Documentation 5 pages of outside documents were reviewed including: nephrology clinic chart.  Medical Decision Making  [name] is a 66 y.o. female who presents with chronic kidney disease stage IV  Based on vein mapping and examination, this patient's permanent access options include: L BC AVF, L BVT  I had an extensive discussion with this patient in regards to the nature of access surgery, including risk, benefits, and alternatives.    The patient is aware that the risks of access surgery include but are not limited to: bleeding, infection, steal syndrome, nerve damage, ischemic monomelic neuropathy, failure of access to mature, and possible need for additional access procedures in the future.  The patient want to discuss her options with Dr. Florene Glen and will contact us when she is ready to proceed.  Adele Barthel, MD [facility] [place] Office: [phone] Pager: [phone]  08/29/2011, 1:09 PM

## 2011-09-01 IMAGING — CR DG ABD PORTABLE 1V
1 series · 1 of 1 positions shown · non-contrast
Comparison: 04/21/2010

CLINICAL DATA: The NG tube placement

ABDOMEN - 1 VIEW

[AP]
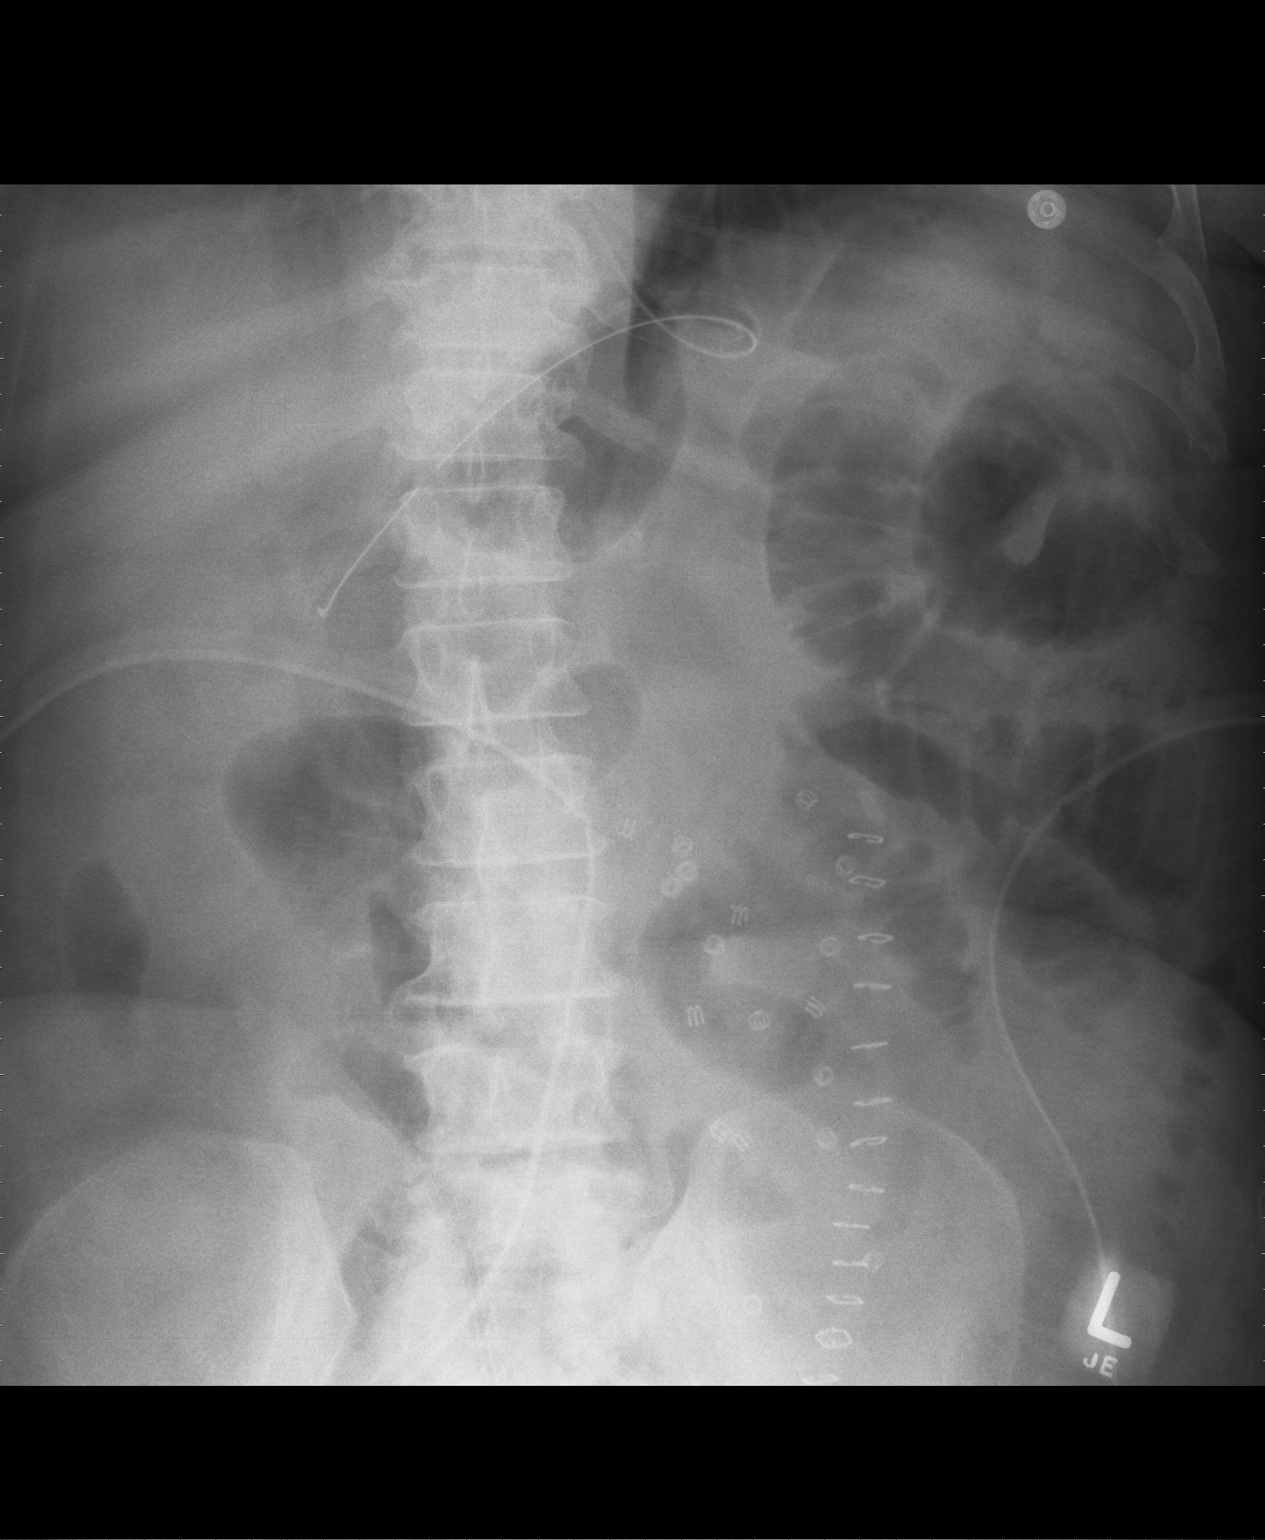

[1 of 1 positions shown; findings below may reference images not displayed]

FINDINGS: 7075 hours.  NG tube tip projects at the antropyloric
region of the stomach.  Gaseous small bowel distention seen
previously has decreased in the interval.  Presumed surgical drains
are seen over the abdomen.
IMPRESSION: NG tube tip projects at the antropyloric region of the stomach.

## 2011-09-02 ENCOUNTER — Ambulatory Visit
Admission: RE | Admit: 2011-09-02 | Discharge: 2011-09-02 | Disposition: A | Discharge status: Home / Self Care | Payer: Medicare HMO | Source: Ambulatory Visit | Attending: Internal Medicine | Admitting: Internal Medicine

## 2011-09-02 DIAGNOSIS — Z1231 Encounter for screening mammogram for malignant neoplasm of breast: Secondary | ICD-10-CM

## 2011-09-02 IMAGING — CR DG CHEST 1V PORT
1 series · 1 of 1 positions shown · non-contrast
Comparison: 04/15/2010 and earlier.

CLINICAL DATA: 65-year-old female PICC line placement.

PORTABLE CHEST - 1 VIEW

[AP]
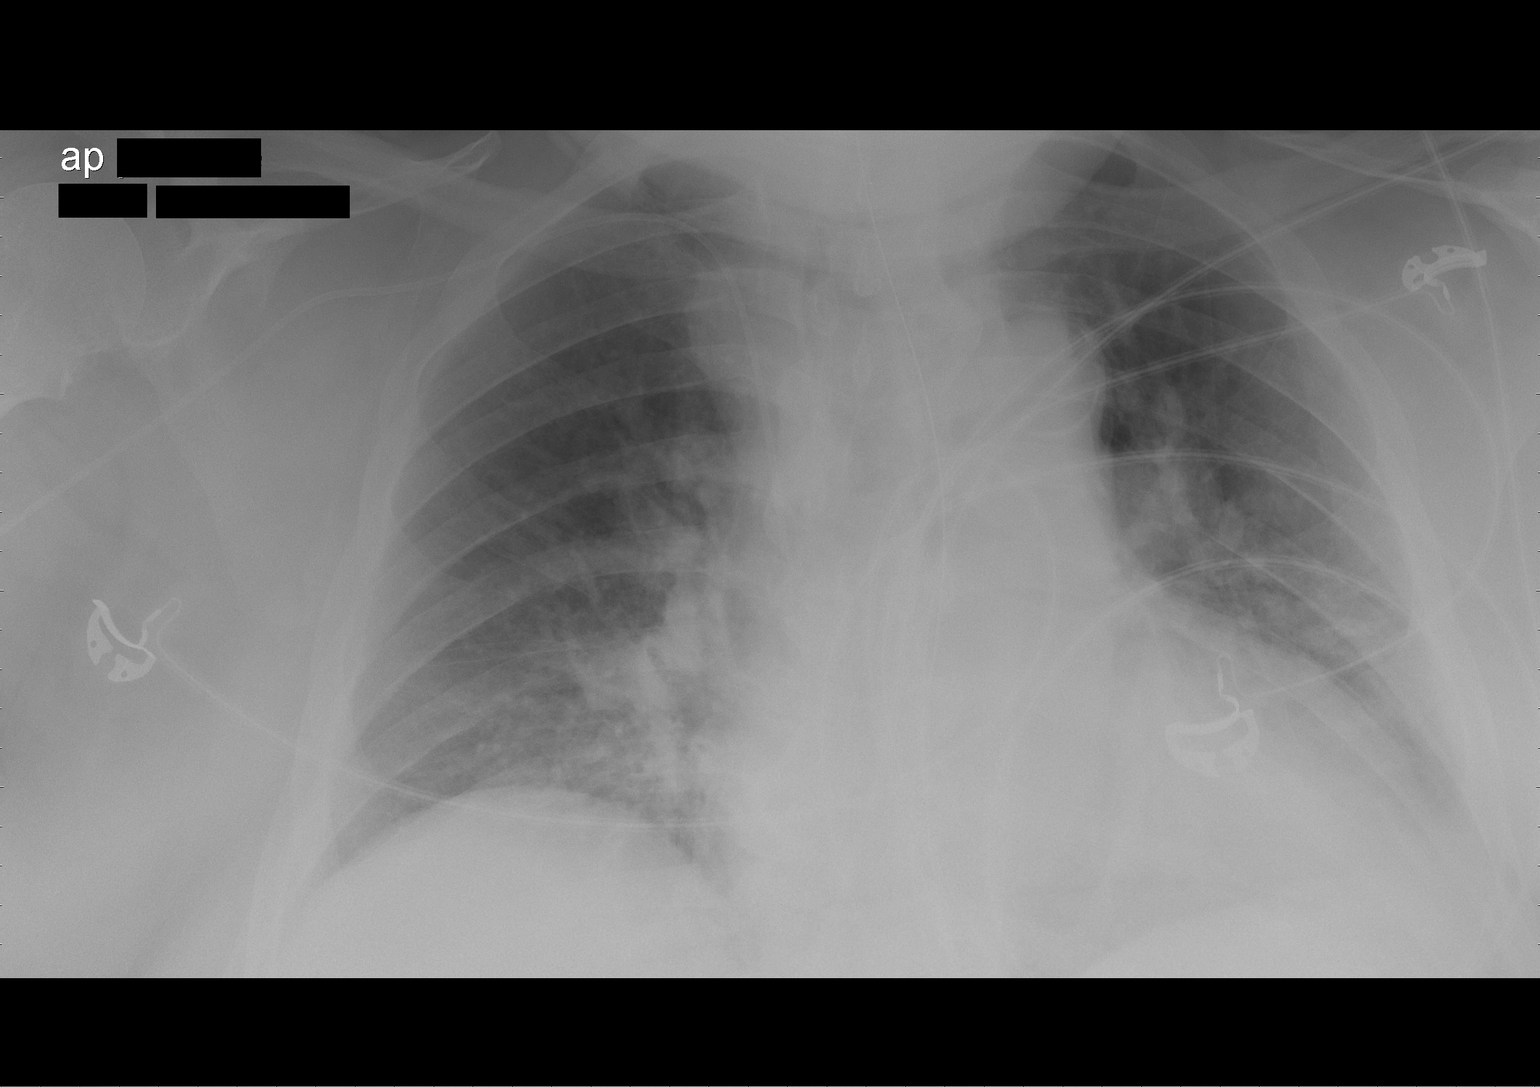

[1 of 1 positions shown; findings below may reference images not displayed]

FINDINGS: AP portable semi upright view 7601 hours. Right upper
extremity approach PICC line catheter, tip at the level of the SVC.
Low lung volumes. Stable cardiomegaly and mediastinal contours.
Patchy bibasilar opacities similar the prior exam.  No
pneumothorax, pulmonary edema or pleural effusion.  Enteric tube
courses to the abdomen, tip not included.
IMPRESSION: 1. Right upper extremity approach PICC line catheter, tip at the
level of the SVC.
2.  Low lung volumes with bibasilar atelectasis.

## 2011-09-05 ENCOUNTER — Telehealth (INDEPENDENT_AMBULATORY_CARE_PROVIDER_SITE_OTHER): Payer: Self-pay

## 2011-09-05 NOTE — Telephone Encounter (Signed)
Called to let them know that we received orders for Dr Donne Hazel to sign re: eye injuries. I notified them that we are only covering homehealth on her abdominal wound. Interim will look into who to send the orders to and told me to shred the orders I have on the pt.

## 2011-09-08 ENCOUNTER — Other Ambulatory Visit (HOSPITAL_COMMUNITY): Payer: Self-pay | Admitting: *Deleted

## 2011-09-09 ENCOUNTER — Encounter (HOSPITAL_COMMUNITY)
Admission: RE | Admit: 2011-09-09 | Discharge: 2011-09-09 | Disposition: A | Discharge status: Home / Self Care | Payer: Medicare HMO | Source: Ambulatory Visit | Attending: Nephrology | Admitting: Nephrology

## 2011-09-09 MED ORDER — EPOETIN ALFA 20000 UNIT/ML IJ SOLN
INTRAMUSCULAR | Status: AC
  Administered 2011-09-09: 20000 [IU] via SUBCUTANEOUS
  Filled 2011-09-09: qty 1

## 2011-09-09 MED ORDER — EPOETIN ALFA 10000 UNIT/ML IJ SOLN
30000.0000 [IU] | INTRAMUSCULAR | Status: DC
  Administered 2011-09-09: 10000 [IU] via SUBCUTANEOUS
  Filled 2011-09-09: qty 1

## 2011-09-15 ENCOUNTER — Encounter (INDEPENDENT_AMBULATORY_CARE_PROVIDER_SITE_OTHER): Payer: Self-pay | Admitting: General Surgery

## 2011-09-15 ENCOUNTER — Ambulatory Visit (INDEPENDENT_AMBULATORY_CARE_PROVIDER_SITE_OTHER): Payer: Medicare HMO | Admitting: General Surgery

## 2011-09-15 VITALS — BP 128/62 | HR 68 | Temp 97.3°F | Resp 16 | Ht 65.0 in | Wt 281.2 lb

## 2011-09-15 DIAGNOSIS — S31109A Unspecified open wound of abdominal wall, unspecified quadrant without penetration into peritoneal cavity, initial encounter: Secondary | ICD-10-CM

## 2011-09-15 NOTE — Progress Notes (Signed)
Subjective:     Patient ID: [name], female   DOB: [birth year], 66 y.o.   MRN: [id number]  HPI This is a 66 year old female who I know well from a laparotomy for a small bowel obstruction. She has had a lot of difficulty healing this abdominal wound and I've returned her to the operating room a couple of times to debride this. This is been slowly healing and she returns today with this completely healed. She complains of some hardness and some occasional soreness at the site but is otherwise doing well with no drainage or any evidence of infection. She is having bowel movements without any difficulty. Since I last seen her she has been in the hospital at [facility] for an eye injury.  Review of Systems     Objective:   Physical Exam Abdomen with several scars, soft, mild tenderness at her scars    Assessment:   Open abdominal wound now healed    Plan:     She can return to full normal activity from my standpoint. I told her that the hardness in the soreness would take several months but this eventually did get tibia to baseline. At this point I asked her to call me back as needed.

## 2011-09-24 ENCOUNTER — Encounter (HOSPITAL_COMMUNITY)
Admission: RE | Admit: 2011-09-24 | Discharge: 2011-09-24 | Disposition: A | Discharge status: Home / Self Care | Payer: Medicare HMO | Source: Ambulatory Visit | Attending: Nephrology | Admitting: Nephrology

## 2011-09-24 ENCOUNTER — Encounter: Payer: Self-pay | Admitting: *Deleted

## 2011-09-24 ENCOUNTER — Other Ambulatory Visit: Payer: Self-pay | Admitting: *Deleted

## 2011-09-24 DIAGNOSIS — D638 Anemia in other chronic diseases classified elsewhere: Secondary | ICD-10-CM | POA: Insufficient documentation

## 2011-09-24 DIAGNOSIS — N183 Chronic kidney disease, stage 3 unspecified: Secondary | ICD-10-CM | POA: Insufficient documentation

## 2011-09-24 LAB — POCT HEMOGLOBIN-HEMACUE: Hemoglobin: 8.6 g/dL — ABNORMAL LOW (ref 12.0–15.0)

## 2011-09-24 MED ORDER — EPOETIN ALFA 20000 UNIT/ML IJ SOLN
INTRAMUSCULAR | Status: AC
  Filled 2011-09-24: qty 1

## 2011-09-24 MED ORDER — EPOETIN ALFA 10000 UNIT/ML IJ SOLN
30000.0000 [IU] | INTRAMUSCULAR | Status: DC
  Administered 2011-09-24: 30000 [IU] via SUBCUTANEOUS

## 2011-09-24 MED ORDER — EPOETIN ALFA 10000 UNIT/ML IJ SOLN
INTRAMUSCULAR | Status: AC
  Filled 2011-09-24: qty 1

## 2011-09-25 ENCOUNTER — Encounter (HOSPITAL_COMMUNITY): Payer: Self-pay | Admitting: Pharmacy Technician

## 2011-09-26 ENCOUNTER — Encounter (HOSPITAL_COMMUNITY)
Admission: RE | Admit: 2011-09-26 | Discharge: 2011-09-26 | Disposition: A | Discharge status: Home / Self Care | Payer: Medicare HMO | Source: Ambulatory Visit | Attending: Vascular Surgery | Admitting: Vascular Surgery

## 2011-09-26 ENCOUNTER — Encounter (HOSPITAL_COMMUNITY): Payer: Self-pay

## 2011-09-26 ENCOUNTER — Ambulatory Visit: Payer: No Typology Code available for payment source | Admitting: Vascular Surgery

## 2011-09-26 LAB — SURGICAL PCR SCREEN: MRSA, PCR: NEGATIVE

## 2011-09-26 NOTE — Pre-Procedure Instructions (Signed)
[place]  09/26/2011   Your procedure is scheduled on:  Tuesday September 10  Report to [facility] at 7:30 AM.  Call this number if you have problems the morning of surgery: [phone]   Remember:   Do not eat or drink:After Midnight.    Take these medicines the morning of surgery with A SIP OF WATER: Amlodipine (Norvasc), carvedilol (Coreg), clonidine (Catapres), colchicine, hydralazine (Apresoline), isosorbide (Imdur). May use eye drops. Oxycodone or hydrocodone if needed.    Do not wear jewelry, make-up or nail polish.  Do not wear lotions, powders, or perfumes. You may wear deodorant.  Do not shave 48 hours prior to surgery. Men may shave face and neck.  Do not bring valuables to the hospital.  Contacts, dentures or bridgework may not be worn into surgery.  Leave suitcase in the car. After surgery it may be brought to your room.  For patients admitted to the hospital, checkout time is 11:00 AM the day of discharge.   Patients discharged the day of surgery will not be allowed to drive home.  Name and phone number of your driver: family  Special Instructions: CHG Shower Use Special Wash: 1/2 bottle night before surgery and 1/2 bottle morning of surgery.   Please read over the following fact sheets that you were given: Pain Booklet, Coughing and Deep Breathing and Surgical Site Infection Prevention

## 2011-09-29 MED ORDER — DEXTROSE 5 % IV SOLN
1.5000 g | INTRAVENOUS | Status: AC
  Administered 2011-09-30: 1.5 g via INTRAVENOUS
  Filled 2011-09-29: qty 1.5

## 2011-09-30 ENCOUNTER — Encounter (HOSPITAL_COMMUNITY)
Admission: RE | Disposition: A | Discharge status: Home / Self Care | Payer: Self-pay | Source: Ambulatory Visit | Attending: Vascular Surgery

## 2011-09-30 ENCOUNTER — Telehealth: Payer: Self-pay | Admitting: Vascular Surgery

## 2011-09-30 ENCOUNTER — Ambulatory Visit (HOSPITAL_COMMUNITY): Payer: Medicare HMO | Admitting: Anesthesiology

## 2011-09-30 ENCOUNTER — Encounter (HOSPITAL_COMMUNITY): Payer: Self-pay | Admitting: Anesthesiology

## 2011-09-30 ENCOUNTER — Ambulatory Visit (HOSPITAL_COMMUNITY)
Admission: RE | Admit: 2011-09-30 | Discharge: 2011-09-30 | Disposition: A | Discharge status: Home / Self Care | Payer: Medicare HMO | Source: Ambulatory Visit | Attending: Vascular Surgery | Admitting: Vascular Surgery

## 2011-09-30 DIAGNOSIS — E119 Type 2 diabetes mellitus without complications: Secondary | ICD-10-CM | POA: Insufficient documentation

## 2011-09-30 DIAGNOSIS — J4489 Other specified chronic obstructive pulmonary disease: Secondary | ICD-10-CM | POA: Insufficient documentation

## 2011-09-30 DIAGNOSIS — N184 Chronic kidney disease, stage 4 (severe): Secondary | ICD-10-CM | POA: Insufficient documentation

## 2011-09-30 DIAGNOSIS — J449 Chronic obstructive pulmonary disease, unspecified: Secondary | ICD-10-CM | POA: Insufficient documentation

## 2011-09-30 DIAGNOSIS — N186 End stage renal disease: Secondary | ICD-10-CM

## 2011-09-30 DIAGNOSIS — I129 Hypertensive chronic kidney disease with stage 1 through stage 4 chronic kidney disease, or unspecified chronic kidney disease: Secondary | ICD-10-CM | POA: Insufficient documentation

## 2011-09-30 DIAGNOSIS — Z794 Long term (current) use of insulin: Secondary | ICD-10-CM | POA: Insufficient documentation

## 2011-09-30 HISTORY — PX: AV FISTULA PLACEMENT: SHX1204

## 2011-09-30 LAB — POCT I-STAT, CHEM 8
BUN: 77 mg/dL — ABNORMAL HIGH (ref 6–23)
Calcium, Ion: 1.13 mmol/L (ref 1.13–1.30)
Hemoglobin: 9.5 g/dL — ABNORMAL LOW (ref 12.0–15.0)
Sodium: 145 mEq/L (ref 135–145)
TCO2: 19 mmol/L (ref 0–100)

## 2011-09-30 LAB — PROTIME-INR: INR: 1.08 (ref 0.00–1.49)

## 2011-09-30 LAB — GLUCOSE, CAPILLARY: Glucose-Capillary: 146 mg/dL — ABNORMAL HIGH (ref 70–99)

## 2011-09-30 SURGERY — ARTERIOVENOUS (AV) FISTULA CREATION
Anesthesia: General | Site: Arm Upper | Laterality: Left | Wound class: Clean

## 2011-09-30 MED ORDER — OXYCODONE HCL 5 MG PO TABS: 5.0000 mg | ORAL_TABLET | ORAL | Status: AC | PRN

## 2011-09-30 MED ORDER — MIDAZOLAM HCL 2 MG/2ML IJ SOLN: 0.5000 mg | Freq: Once | INTRAMUSCULAR | Status: DC | PRN

## 2011-09-30 MED ORDER — 0.9 % SODIUM CHLORIDE (POUR BTL) OPTIME
TOPICAL | Status: DC | PRN
  Administered 2011-09-30: 1000 mL

## 2011-09-30 MED ORDER — THROMBIN 20000 UNITS EX SOLR
CUTANEOUS | Status: AC
  Filled 2011-09-30: qty 20000

## 2011-09-30 MED ORDER — FENTANYL CITRATE 0.05 MG/ML IJ SOLN
INTRAMUSCULAR | Status: DC | PRN
  Administered 2011-09-30: 100 ug via INTRAVENOUS

## 2011-09-30 MED ORDER — DEXTROSE 5 % IV SOLN
INTRAVENOUS | Status: DC | PRN
  Administered 2011-09-30 (×2): via INTRAVENOUS

## 2011-09-30 MED ORDER — SODIUM CHLORIDE 0.9 % IV SOLN
INTRAVENOUS | Status: DC | PRN
  Administered 2011-09-30 (×2): via INTRAVENOUS

## 2011-09-30 MED ORDER — ONDANSETRON HCL 4 MG/2ML IJ SOLN
INTRAMUSCULAR | Status: DC | PRN
  Administered 2011-09-30: 4 mg via INTRAVENOUS

## 2011-09-30 MED ORDER — PROMETHAZINE HCL 25 MG/ML IJ SOLN: 6.2500 mg | INTRAMUSCULAR | Status: DC | PRN

## 2011-09-30 MED ORDER — SODIUM CHLORIDE 0.9 % IV SOLN: INTRAVENOUS | Status: DC

## 2011-09-30 MED ORDER — NEOSTIGMINE METHYLSULFATE 1 MG/ML IJ SOLN
INTRAMUSCULAR | Status: DC | PRN
  Administered 2011-09-30: 5 mg via INTRAVENOUS

## 2011-09-30 MED ORDER — PROPOFOL 10 MG/ML IV BOLUS
INTRAVENOUS | Status: DC | PRN
  Administered 2011-09-30: 120 mg via INTRAVENOUS

## 2011-09-30 MED ORDER — FENTANYL CITRATE 0.05 MG/ML IJ SOLN
25.0000 ug | INTRAMUSCULAR | Status: DC | PRN
  Administered 2011-09-30 (×2): 25 ug via INTRAVENOUS

## 2011-09-30 MED ORDER — MEPERIDINE HCL 25 MG/ML IJ SOLN: 6.2500 mg | INTRAMUSCULAR | Status: DC | PRN

## 2011-09-30 MED ORDER — FENTANYL CITRATE 0.05 MG/ML IJ SOLN
INTRAMUSCULAR | Status: AC
  Administered 2011-09-30: 25 ug via INTRAVENOUS
  Filled 2011-09-30: qty 2

## 2011-09-30 MED ORDER — GLYCOPYRROLATE 0.2 MG/ML IJ SOLN
INTRAMUSCULAR | Status: DC | PRN
  Administered 2011-09-30: .8 mg via INTRAVENOUS

## 2011-09-30 MED ORDER — SODIUM CHLORIDE 0.9 % IV SOLN
INTRAVENOUS | Status: DC
  Administered 2011-09-30: 10:00:00 via INTRAVENOUS

## 2011-09-30 MED ORDER — SODIUM CHLORIDE 0.9 % IR SOLN
Status: DC | PRN
  Administered 2011-09-30: 10:00:00

## 2011-09-30 MED ORDER — BUPIVACAINE HCL (PF) 0.5 % IJ SOLN
INTRAMUSCULAR | Status: AC
  Filled 2011-09-30: qty 30

## 2011-09-30 MED ORDER — ROCURONIUM BROMIDE 100 MG/10ML IV SOLN
INTRAVENOUS | Status: DC | PRN
  Administered 2011-09-30: 40 mg via INTRAVENOUS

## 2011-09-30 MED ORDER — LIDOCAINE-EPINEPHRINE (PF) 1 %-1:200000 IJ SOLN
INTRAMUSCULAR | Status: AC
  Filled 2011-09-30: qty 10

## 2011-09-30 SURGICAL SUPPLY — 40 items
CANISTER SUCTION 2500CC (MISCELLANEOUS) ×2 IMPLANT
CLIP TI MEDIUM 6 (CLIP) ×2 IMPLANT
CLIP TI WIDE RED SMALL 6 (CLIP) ×2 IMPLANT
CLOTH BEACON ORANGE TIMEOUT ST (SAFETY) ×2 IMPLANT
COVER PROBE W GEL 5X96 (DRAPES) ×2 IMPLANT
COVER SURGICAL LIGHT HANDLE (MISCELLANEOUS) ×2 IMPLANT
DECANTER SPIKE VIAL GLASS SM (MISCELLANEOUS) ×2 IMPLANT
DERMABOND ADVANCED (GAUZE/BANDAGES/DRESSINGS) ×1
DERMABOND ADVANCED .7 DNX12 (GAUZE/BANDAGES/DRESSINGS) ×1 IMPLANT
DRAIN PENROSE 1/2X12 LTX STRL (WOUND CARE) IMPLANT
ELECT REM PT RETURN 9FT ADLT (ELECTROSURGICAL) ×2
ELECTRODE REM PT RTRN 9FT ADLT (ELECTROSURGICAL) ×1 IMPLANT
GLOVE BIO SURGEON STRL SZ 6.5 (GLOVE) ×4 IMPLANT
GLOVE BIO SURGEON STRL SZ7 (GLOVE) ×2 IMPLANT
GLOVE BIOGEL PI IND STRL 6.5 (GLOVE) ×2 IMPLANT
GLOVE BIOGEL PI IND STRL 7.0 (GLOVE) ×2 IMPLANT
GLOVE BIOGEL PI IND STRL 7.5 (GLOVE) ×1 IMPLANT
GLOVE BIOGEL PI INDICATOR 6.5 (GLOVE) ×2
GLOVE BIOGEL PI INDICATOR 7.0 (GLOVE) ×2
GLOVE BIOGEL PI INDICATOR 7.5 (GLOVE) ×1
GLOVE ECLIPSE 7.0 STRL STRAW (GLOVE) ×2 IMPLANT
GLOVE SURG SS PI 6.5 STRL IVOR (GLOVE) ×2 IMPLANT
GOWN STRL NON-REIN LRG LVL3 (GOWN DISPOSABLE) ×8 IMPLANT
KIT BASIN OR (CUSTOM PROCEDURE TRAY) ×2 IMPLANT
KIT ROOM TURNOVER OR (KITS) ×2 IMPLANT
LOOP VESSEL MINI RED (MISCELLANEOUS) ×2 IMPLANT
NS IRRIG 1000ML POUR BTL (IV SOLUTION) ×2 IMPLANT
PACK CV ACCESS (CUSTOM PROCEDURE TRAY) ×2 IMPLANT
PAD ARMBOARD 7.5X6 YLW CONV (MISCELLANEOUS) ×4 IMPLANT
SPONGE SURGIFOAM ABS GEL 100 (HEMOSTASIS) IMPLANT
SUT MNCRL AB 4-0 PS2 18 (SUTURE) ×2 IMPLANT
SUT PROLENE 6 0 BV (SUTURE) IMPLANT
SUT PROLENE 7 0 BV 1 (SUTURE) ×2 IMPLANT
SUT VIC AB 3-0 SH 27 (SUTURE) ×1
SUT VIC AB 3-0 SH 27X BRD (SUTURE) ×1 IMPLANT
SUT VICRYL 4-0 PS2 18IN ABS (SUTURE) ×2 IMPLANT
TOWEL OR 17X24 6PK STRL BLUE (TOWEL DISPOSABLE) ×2 IMPLANT
TOWEL OR 17X26 10 PK STRL BLUE (TOWEL DISPOSABLE) ×2 IMPLANT
UNDERPAD 30X30 INCONTINENT (UNDERPADS AND DIAPERS) ×2 IMPLANT
WATER STERILE IRR 1000ML POUR (IV SOLUTION) ×2 IMPLANT

## 2011-09-30 NOTE — Telephone Encounter (Addendum)
Message copied by Lujean Amel on Tue Sep 30, 2011  2:32 PM ------      Message from: Alfonso Patten      Created: Tue Sep 30, 2011 12:19 PM                   ----- Message -----         From: Conrad [place], MD         Sent: 09/30/2011  12:02 PM           To: Patrici Ranks, Alfonso Patten, RN            Myrtis Ser      [id number]      1945/05/18            PROCEDURE:      left first stage brachial vein transposition (brachiobrachial arteriovenous fistula) placement            Asst: Leontine Locket, [facility]             Follow-up: 4 week  I scheduled an appointment for the above patient on 10/31/11 w/ [facility] at 1:15pm. I was unable to reach the pt by phone so I mailed an appt letter. awt

## 2011-09-30 NOTE — Anesthesia Preprocedure Evaluation (Addendum)
Anesthesia Evaluation  Patient identified by MRN, date of birth, ID band Patient awake    Reviewed: Allergy & Precautions, H&P , NPO status , Patient's Chart, lab work & pertinent test results, reviewed documented beta blocker date and time   History of Anesthesia Complications Negative for: history of anesthetic complications  Airway Mallampati: II TM Distance: >3 FB Neck ROM: Full    Dental  (+) Edentulous Upper, Poor Dentition and Dental Advisory Given   Pulmonary shortness of breath, sleep apnea and Continuous Positive Airway Pressure Ventilation , COPDformer smoker,  breath sounds clear to auscultation  Pulmonary exam normal       Cardiovascular hypertension, Pt. on medications and Pt. on home beta blockers +CHF - CAD and - Past MI - dysrhythmias Rhythm:Regular Rate:Normal  '07 cath: normal coronaries, normal LVF   Neuro/Psych negative neurological ROS  negative psych ROS   GI/Hepatic GERD-  Medicated and Controlled,  Endo/Other  diabetes (glu 146), Type 2, Insulin Dependent and Oral Hypoglycemic AgentsMorbid obesity  Renal/GU ESRFRenal disease (K+ 3.7, no dialysis yet)     Musculoskeletal   Abdominal (+) + obese,   Peds  Hematology  (+) Blood dyscrasia, anemia ,   Anesthesia Other Findings   Reproductive/Obstetrics                         Anesthesia Physical Anesthesia Plan  ASA: III  Anesthesia Plan: General   Post-op Pain Management:    Induction: Intravenous  Airway Management Planned: Oral ETT  Additional Equipment:   Intra-op Plan:   Post-operative Plan: Extubation in OR  Informed Consent: I have reviewed the patients History and Physical, chart, labs and discussed the procedure including the risks, benefits and alternatives for the proposed anesthesia with the patient or authorized representative who has indicated his/her understanding and acceptance.   Dental advisory  given  Plan Discussed with: CRNA and Surgeon  Anesthesia Plan Comments: (Plan routine monitors, GETA)        Anesthesia Quick Evaluation

## 2011-09-30 NOTE — Preoperative (Signed)
Beta Blockers   Reason not to administer Beta Blockers:Coreg 0630 today

## 2011-09-30 NOTE — H&P (Signed)
[facility] [place]  Brief History and Physical  History of Present Illness  [name] is a 66 y.o. female who presents with chief complaint: chronic kidney disease stage IV .  The patient presents today for L [facility] AVF placement.    Past Medical History  Diagnosis Date  . Arthritis   . Abdominal abscess 12-17-10    abdominal abscesses x2 ? one at this time  . Staph aureus infection November 2012   . Myocardial infarction 12-17-10    80's-abnormal testing showed  . Swelling of both ankles 12-17-10  . Blood transfusion, without reported diagnosis 12-17-10    "thinks in [facility]  . Chronic kidney disease   . Chronic renal insufficiency 2009    sees Dr. Florene Glen  every 4-5 mo  . Anemia   . Diabetes mellitus     type 2 IDDM x 6-7 years  . Coronary artery disease     Dr Kadakia-cardiologist 2-3 x year  . CHF (congestive heart failure)   . Hypertension   . Diabetes mellitus   . Dysrhythmia     irregular, "skips beats"  . Shortness of breath   . Sleep apnea 2012    CPAP    Past Surgical History  Procedure Date  . Ventral hernia repair     component separation, repair with biologic  . Vaginal hysterectomy   . Cardiac catheterization 12-17-10    '80's  . Abdominal hysterectomy 12-17-10    Vaginal Hysterectomy  . Cataract extraction w/ intraocular lens implant 12-17-10    bilateral  . Laparotomy 12/18/2010    Procedure: EXPLORATORY LAPAROTOMY;  Surgeon: Rolm Bookbinder, MD;  Location: [facility];  Service: General;  Laterality: N/A;  Abdominal Seroma Evacuation  . Back surgery   . Lumbar disc surgery 1970'-80's    X 3  . Eye surgery   . Wound debridement 03/20/2011    Procedure: DEBRIDEMENT ABDOMINAL WOUND;  Surgeon: Rolm Bookbinder, MD;  Location: [facility];  Service: General;  Laterality: N/A;  debridement abdominal wall, placement of wound vac  . Abdominal hysterectomy   . Cataract extraction, bilateral     History   Social History    . Marital Status: Widowed    Spouse Name: N/A    Number of Children: N/A  . Years of Education: N/A   Occupational History  . Not on file.   Social History Main Topics  . Smoking status: Former Smoker    Types: Cigarettes    Quit date: 12/16/1980  . Smokeless tobacco: Never Used  . Alcohol Use: No  . Drug Use: No  . Sexually Active: No   Other Topics Concern  . Not on file   Social History Narrative   ** Merged History Encounter **     Family History  Problem Relation Age of Onset  . Hypertension Mother   . Cancer Brother     spine  . Cancer Sister     brain  . Hypertension Maternal Grandmother   . Anesthesia problems Neg Hx     No current facility-administered medications on file prior to encounter.   Current Outpatient Prescriptions on File Prior to Encounter  Medication Sig Dispense Refill  . amLODipine (NORVASC) 10 MG tablet Take 10 mg by mouth daily.       Marland Kitchen aspirin EC 81 MG tablet Take 81 mg by mouth daily.        Marland Kitchen atropine 1 % ophthalmic solution Place 1 drop into the  right eye daily.       . bisacodyl (DULCOLAX) 5 MG EC tablet Take 5 mg by mouth daily as needed. For constipation      . Blood Glucose Monitoring Suppl (FORA V12 BLOOD GLUCOSE SYSTEM) DEVI       . calcitRIOL (ROCALTROL) 0.25 MCG capsule Take 0.25 mcg by mouth daily.      . Calcium Carbonate-Vitamin D (CALCIUM 600 + D PO) Take 1 tablet by mouth daily.       . carvedilol (COREG) 25 MG tablet Take 25 mg by mouth 2 (two) times daily.      Marland Kitchen CLEVER CHEK AUTO-CODE VOICE test strip       . colchicine 0.6 MG tablet Take 0.6 mg by mouth daily.      Marland Kitchen docusate sodium (COLACE) 100 MG capsule Take 100 mg by mouth daily as needed. For constipation      . ferrous sulfate 325 (65 FE) MG tablet Take 325 mg by mouth daily with breakfast.        . furosemide (LASIX) 80 MG tablet Take 80 mg by mouth 2 (two) times daily.       . hydrALAZINE (APRESOLINE) 25 MG tablet Take 25 mg by mouth 2 (two) times daily.       Marland Kitchen HYDROcodone-acetaminophen (NORCO) 10-325 MG per tablet Take 1-2 tablets by mouth every 6 (six) hours as needed. For pain      . insulin glargine (LANTUS) 100 UNIT/ML injection Inject 2-5 Units into the skin at bedtime.       . insulin lispro (HUMALOG) 100 UNIT/ML injection Inject 5 Units into the skin 2 (two) times daily.       . isosorbide mononitrate (IMDUR) 60 MG 24 hr tablet Take 60 mg by mouth daily.       . Multiple Vitamin (MULITIVITAMIN WITH MINERALS) TABS Take 1 tablet by mouth daily.      Marland Kitchen NITROSTAT 0.4 MG SL tablet Take 0.4 mg by mouth every 5 (five) minutes as needed. For chest pain      . oxyCODONE-acetaminophen (PERCOCET) 10-325 MG per tablet Take 1 tablet by mouth every 6 (six) hours as needed for pain.  20 tablet  0  . pioglitazone (ACTOS) 15 MG tablet Take 15 mg by mouth daily.      . prednisoLONE acetate (PRED FORTE) 1 % ophthalmic suspension Place 1 drop into the right eye daily.       . rosuvastatin (CRESTOR) 20 MG tablet Take 20 mg by mouth daily.      Marland Kitchen VIGAMOX 0.5 % ophthalmic solution Place 1 drop into the right eye daily.       . Lite Touch Lancets MISC       . mupirocin ointment (BACTROBAN) 2 % Apply 1 application topically 2 (two) times daily. Apply to bilateral nares for positive PCR        No Known Allergies  Review of Systems: As listed above, otherwise negative.  Physical Examination  Filed Vitals:   09/30/11 0724  BP: 132/83  Pulse: 64  Temp: 98.6 F (37 C)  TempSrc: Oral  SpO2: 98%    General: A&O x 3, WDWN  Pulmonary: Sym exp, good air movt, CTAB, no rales, rhonchi, & wheezing  Cardiac: RRR, Nl S1, S2, no Murmurs, rubs or gallops  Gastrointestinal: soft, NTND, -G/R, - HSM, - masses, - CVAT B  Musculoskeletal: M/S 5/5 throughout , Extremities without ischemic changes   Laboratory See [facility]  Medical Decision Making  [name] is a 66 y.o. female who presents with: chronic kidney disease stage IV .   The patient is scheduled  for: L BC AVF  Risk, benefits, and alternatives to access surgery were discussed.  The patient is aware the risks include but are not limited to: bleeding, infection, steal syndrome, nerve damage, ischemic monomelic neuropathy, failure to mature, and need for additional procedures.  The patient is aware of the risks and agrees to proceed.  Adele Barthel, MD [facility] [place] Office: [phone] Pager: [phone]  09/30/2011, 7:32 AM

## 2011-09-30 NOTE — Anesthesia Postprocedure Evaluation (Signed)
  Anesthesia Post-op Note  Patient: [name]  Procedure(s) Performed: Procedure(s) (LRB) with comments: ARTERIOVENOUS (AV) FISTULA CREATION (Left) - BRACHIAL-CEPHALIC  Patient Location: PACU  Anesthesia Type: General  Level of Consciousness: awake, alert  and oriented  Airway and Oxygen Therapy: Patient Spontanous Breathing  Post-op Pain: none  Post-op Assessment: Post-op Vital signs reviewed, Patient's Cardiovascular Status Stable, Respiratory Function Stable, Patent Airway, No signs of Nausea or vomiting and Pain level controlled  Post-op Vital Signs: Reviewed and stable  Complications: No apparent anesthesia complications

## 2011-09-30 NOTE — Anesthesia Procedure Notes (Signed)
Procedure Name: Intubation Date/Time: 09/30/2011 10:29 AM Performed by: Williemae Area B Pre-anesthesia Checklist: Patient identified, Emergency Drugs available, Suction available and Patient being monitored Patient Re-evaluated:Patient Re-evaluated prior to inductionOxygen Delivery Method: Circle system utilized Preoxygenation: Pre-oxygenation with 100% oxygen Intubation Type: IV induction Ventilation: Two handed mask ventilation required, Oral airway inserted - appropriate to patient size and Mask ventilation without difficulty Laryngoscope Size: Mac and 4 Grade View: Grade II Tube type: Oral Tube size: 7.5 mm Number of attempts: 2 (tissue flap fell across VC) Airway Equipment and Method: Stylet and Patient positioned with wedge pillow (bed pillow under upper back) Placement Confirmation: ETT inserted through vocal cords under direct vision,  breath sounds checked- equal and bilateral and positive ETCO2 Secured at: 21 (cm at upper gum) cm Tube secured with: Tape Dental Injury: Teeth and Oropharynx as per pre-operative assessment and Bloody posterior oropharynx

## 2011-09-30 NOTE — Transfer of Care (Signed)
Immediate Anesthesia Transfer of Care Note  Patient: [name]  Procedure(s) Performed: Procedure(s) (LRB) with comments: ARTERIOVENOUS (AV) FISTULA CREATION (Left) - BRACHIAL-CEPHALIC  Patient Location: PACU  Anesthesia Type: General  Level of Consciousness: awake, alert  and patient cooperative  Airway & Oxygen Therapy: Patient Spontanous Breathing and Patient connected to face mask oxygen  Post-op Assessment: Report given to PACU RN, Post -op Vital signs reviewed and stable and Patient moving all extremities  Post vital signs: Reviewed and stable  Complications: No apparent anesthesia complications

## 2011-09-30 NOTE — Op Note (Signed)
OPERATIVE NOTE   PROCEDURE: 1. left first stage brachial vein transposition (brachiobrachial arteriovenous fistula) placement  PRE-OPERATIVE DIAGNOSIS: chronic kidney disease stage IV   POST-OPERATIVE DIAGNOSIS: same as above   SURGEON: Adele Barthel, MD  ASSISTANT(S): Leontine Locket, PAC   ANESTHESIA: general  ESTIMATED BLOOD LOSS: 30 cc  FINDING(S): 1. Sclerotic, inadequate cephalic vein 2. Weak thrill in brachial vein transposition  3. Diseased brachial artery 4. Dopplerable left radial signal at end of case  SPECIMEN(S):  none  INDICATIONS:   [name] is a 66 y.o. female who presents with chronic kidney disease stage IV .  The patient is scheduled for left brachiocephalic arteriovenous fistula .  The patient is aware the risks include but are not limited to: bleeding, infection, steal syndrome, nerve damage, ischemic monomelic neuropathy, failure to mature, and need for additional procedures.  The patient is aware of the risks of the procedure and elects to proceed forward.  DESCRIPTION: After full informed written consent was obtained from the patient, the patient was brought back to the operating room and placed supine upon the operating table.  Prior to induction, the patient received IV antibiotics.   After obtaining adequate anesthesia, the patient was then prepped and draped in the standard fashion for a left arm access procedure.  I turned my attention first to identifying the patient's cephalic vein and brachial artery.  Using SonoSite guidance, the location of these vessels were marked out on the skin.   I made a transverse incision at the level of the antecubitum and dissected through the subcutaneous tissue and fascia to gain exposure of the brachial artery.  This was noted to be 4 mm in diameter externally with evidence of atherosclerosis.  This was dissected out proximally and distally and controlled with vessel loops .  I then dissected out the cephalic vein.  The  vein was densely adherent to adjacent fat and appeared sclerotic in appearance.  I dissected this vein out proximally and distally and tied off the distal vein.  I interrogated the vein with a 2 mm dilator.  It would not pass proximally due to a severe stenosis in the proximal segment of the vein.  I tried to gentle dilate the vein pass this stenosis.  Instead the dissector tore the vein distal to the stenosis.  I tied off the proximal aspect of this vein, as it was not suitable as a conduit.  I explored the more lateral aspect of this arm, but no adequately sized vein could be found laterally.    I then turned my attention to the basilic vein.  Under sonosite guidance, I could not find the vein, so I elected to try to a brachial vein transposition.  I dissected out the more medial brachial vein.  This was noted to be 3 mm in diameter externally.  The distal segment of the vein was ligated with a  2-0 silk, and the vein was transected.  The proximal segment was iinterrogated with serial dilators.  The vein accepted up to a 3 mm dilator without any difficulty.  I then instilled the heparinized saline into the vein and clamped it.  At this point, I reset my exposure of the brachial artery and placed the artery under tension proximally and distally.  I made an arteriotomy with a #11 blade, and then I extended the arteriotomy with a Potts scissor.  I injected heparinized saline proximal and distal to this arteriotomy.  The vein was then sewn to the artery  in an end-to-side configuration with a running stitch of 7-0 Prolene.  Prior to completing this anastomosis, I allowed the vein and artery to backbleed.  There was no evidence of clot from any vessels.  I completed the anastomosis in the usual fashion and then released all vessel loops and clamps.  There was a weakly palpable  thrill in the venous outflow, and there was a dopplerable radial pulse.  At this point, I irrigated out the surgical wound.  There was no  further active bleeding.  The subcutaneous tissue was reapproximated with a running stitch of 3-0 Vicryl.  The skin was then reapproximated with a running subcuticular stitch of 4-0 Vicryl.  The skin was then cleaned, dried, and reinforced with Dermabond.  The patient tolerated this procedure well.    Unfortunately, I have concerns that this stage brachial vein transposition may not be successful due the poor quality of the vein.  As the patient is not in immediate risk for hemodialysis, I don't think its appropriate to proceed with arteriovenous graft placement.     COMPLICATIONS: none  CONDITION: stable  Adele Barthel, MD [facility] [place] Office: [phone] Pager: [phone]  09/30/2011, 11:51 AM

## 2011-10-02 ENCOUNTER — Encounter (HOSPITAL_COMMUNITY): Payer: Self-pay | Admitting: Vascular Surgery

## 2011-10-08 ENCOUNTER — Encounter (HOSPITAL_COMMUNITY)
Admission: RE | Admit: 2011-10-08 | Discharge: 2011-10-08 | Disposition: A | Discharge status: Home / Self Care | Payer: Medicare HMO | Source: Ambulatory Visit | Attending: Nephrology | Admitting: Nephrology

## 2011-10-08 LAB — POCT HEMOGLOBIN-HEMACUE: Hemoglobin: 9.5 g/dL — ABNORMAL LOW (ref 12.0–15.0)

## 2011-10-08 MED ORDER — EPOETIN ALFA 10000 UNIT/ML IJ SOLN
30000.0000 [IU] | INTRAMUSCULAR | Status: DC
  Administered 2011-10-08: 10000 [IU] via SUBCUTANEOUS

## 2011-10-08 MED ORDER — EPOETIN ALFA 10000 UNIT/ML IJ SOLN
INTRAMUSCULAR | Status: AC
  Filled 2011-10-08: qty 1

## 2011-10-08 MED ORDER — EPOETIN ALFA 20000 UNIT/ML IJ SOLN
INTRAMUSCULAR | Status: AC
  Administered 2011-10-08: 20000 [IU] via SUBCUTANEOUS
  Filled 2011-10-08: qty 1

## 2011-10-21 IMAGING — US US ABSCESS DRAINAGE W/ CATHETER
1 series · 8 of 8 positions shown · non-contrast
Comparison: none

CLINICAL DATA: Abdominal wall large ventral hernia repair,
recurrent abdominal wall subcutaneous complex seroma

[Series 1: us abscess drainage w/ catheter · 0.32mm/px · 8 of 8 slices shown]
[im 1/8]
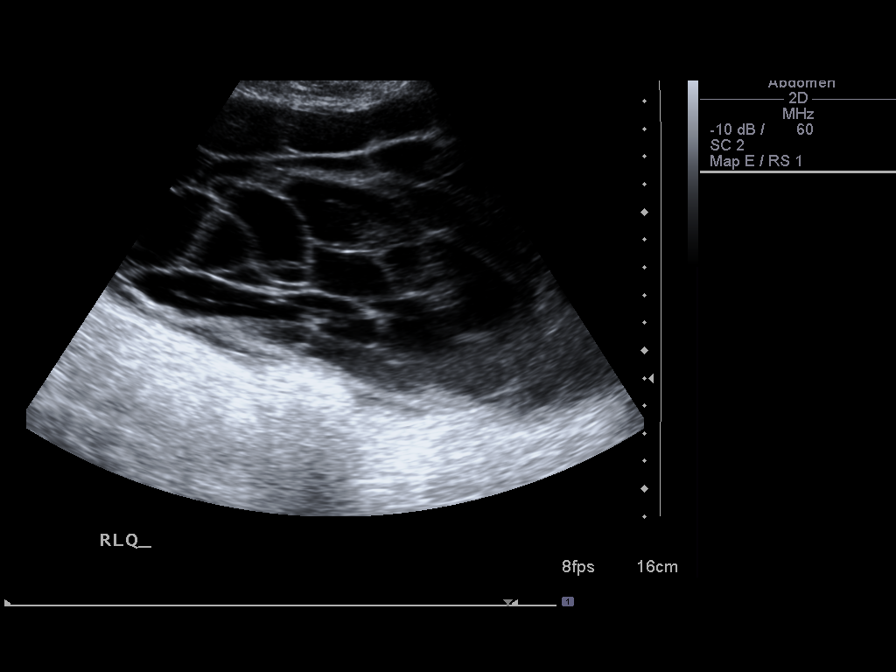
[im 2/8]
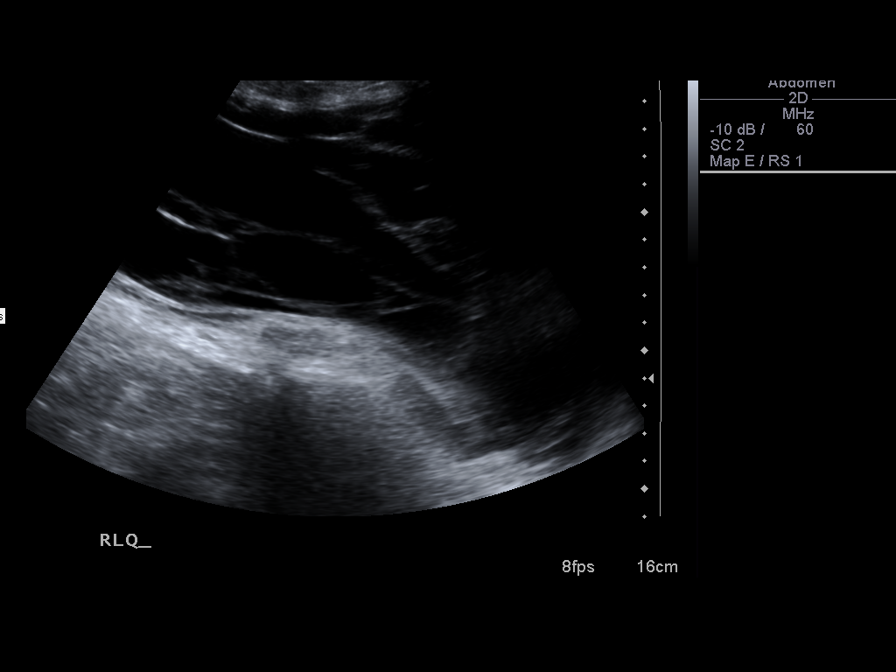
[im 3/8]
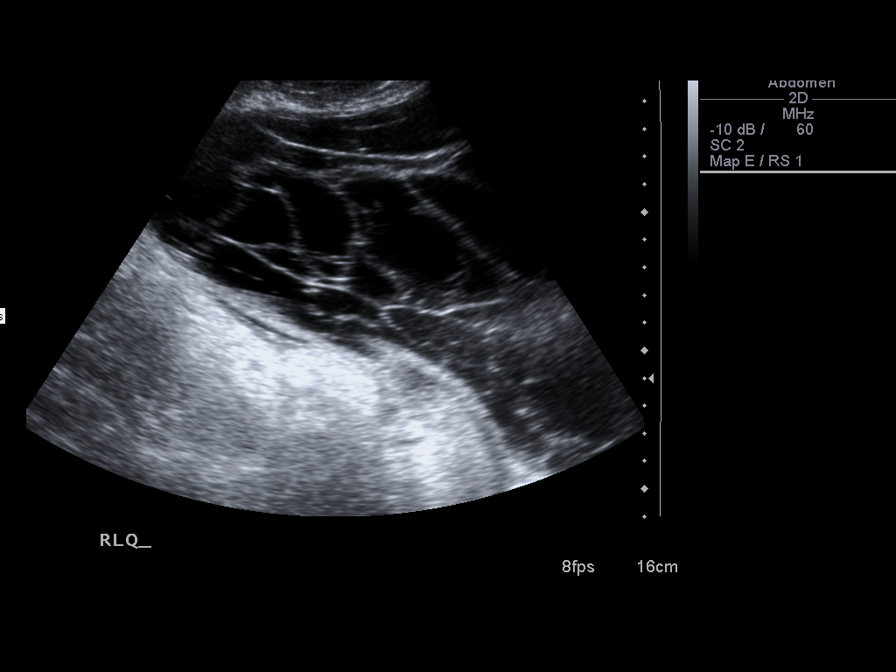
[im 4/8]
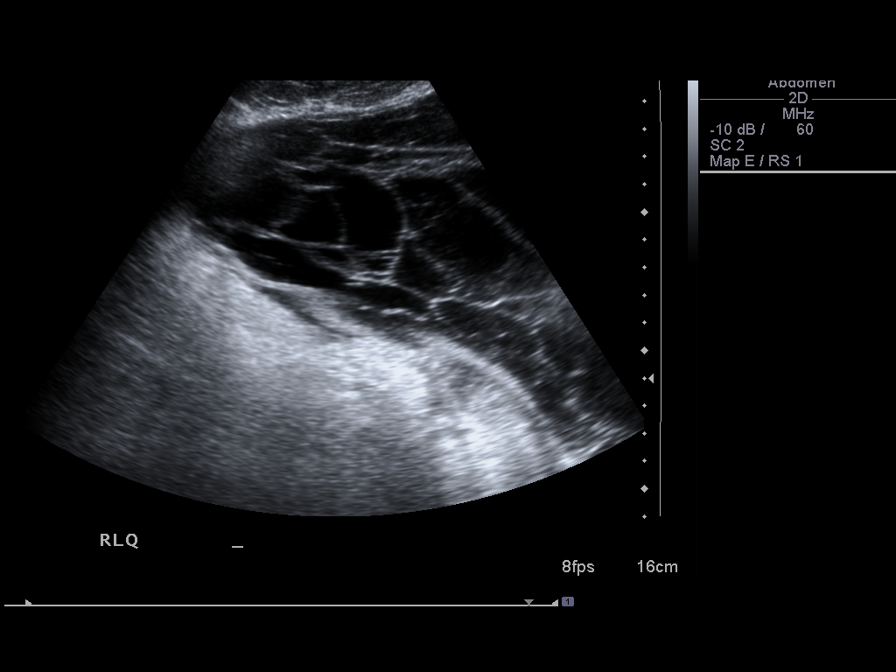
[im 5/8]
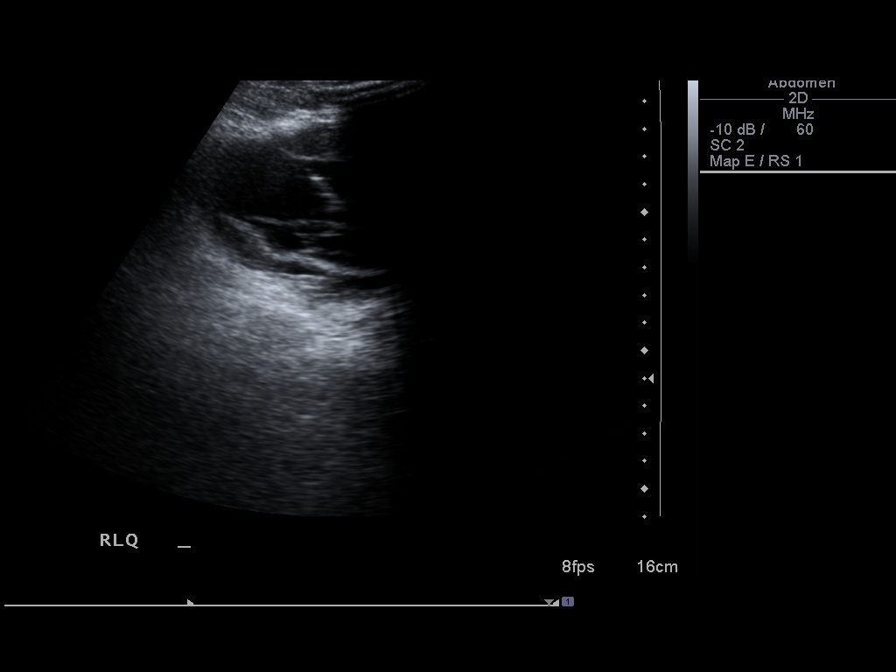
[im 6/8]
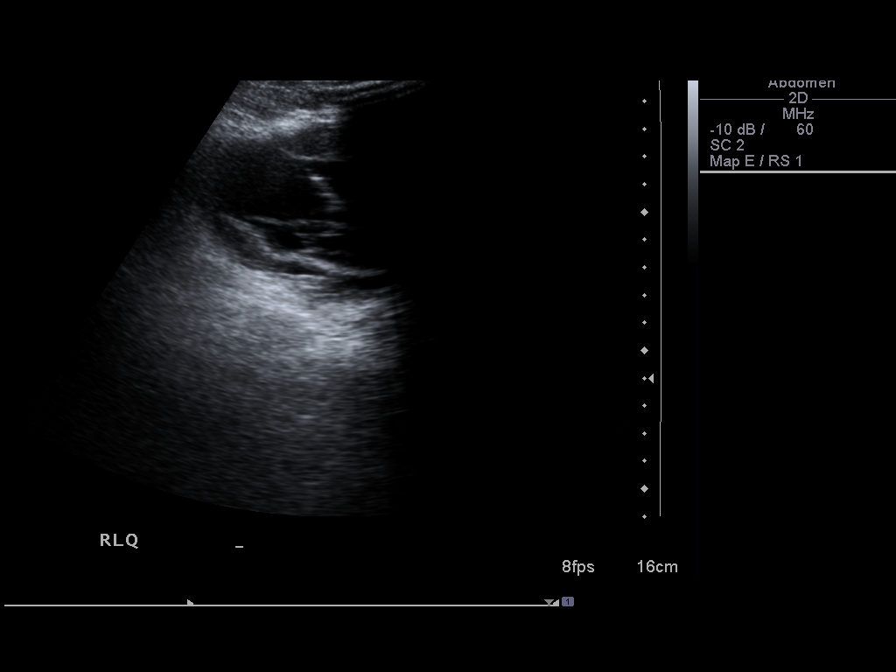
[im 7/8]
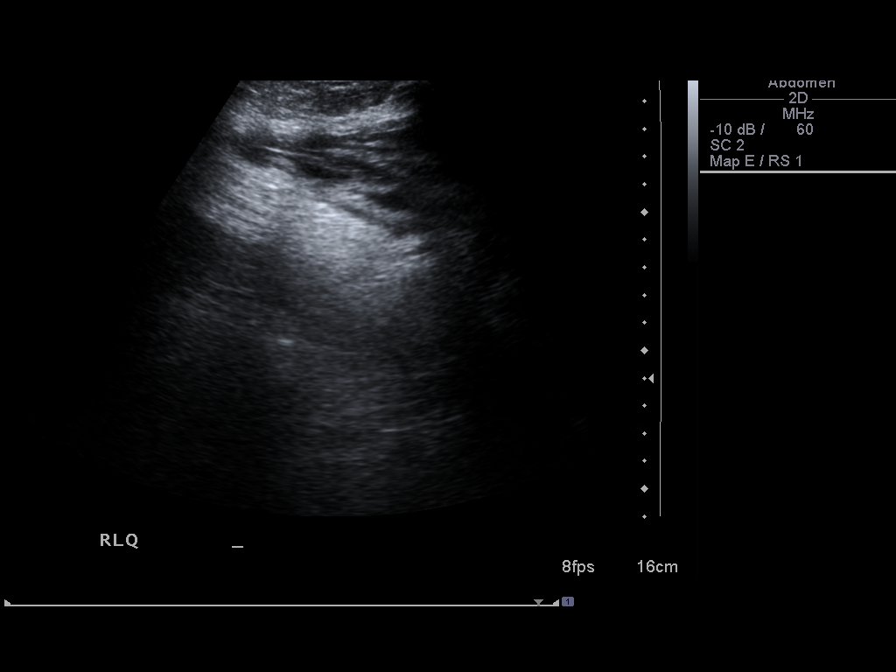
[im 8/8]
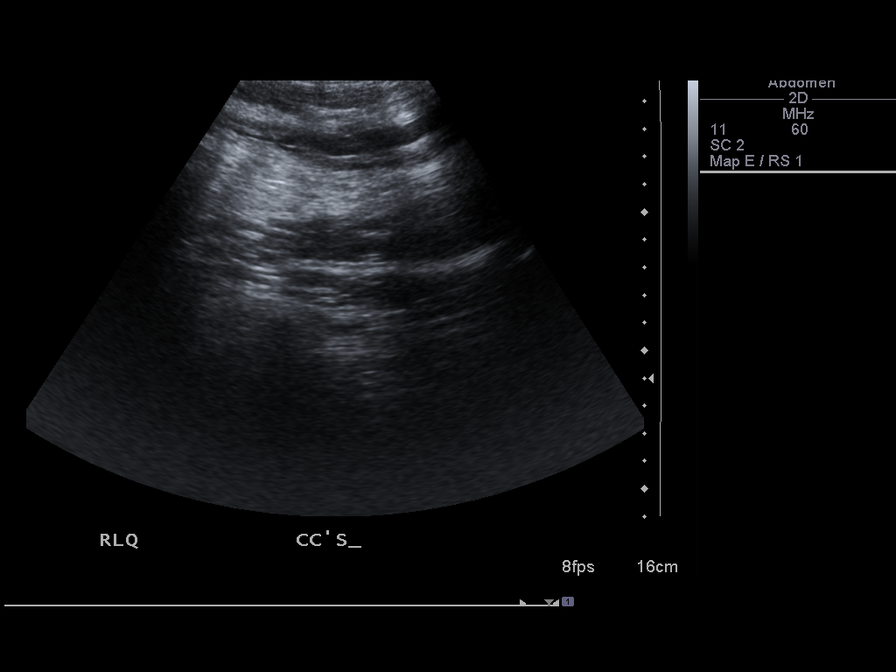

[8 of 8 positions shown; findings below may reference images not displayed]

ULTRASOUND ABDOMINAL WALL SEROMA DRAIN PLACEMENT

Date:  06/13/2010 [DATE]

Radiologist:  Nalleli Alanis, M.D.

Medications:  1 mg Versed, 25 mcg Fentanyl

Guidance:  Ultrasound

Fluoroscopy time:  None.

Sedation time:  18 minutes

Contrast volume:  None.

Complications:  No immediate

PROCEDURE/FINDINGS:

Informed consent was obtained from the patient following
explanation of the procedure, risks, benefits and alternatives.
The patient understands, agrees and consents for the procedure.
All questions were addressed.  A time out was performed.

Maximal barrier sterile technique utilized including caps, mask,
sterile gowns, sterile gloves, large sterile drape, hand hygiene,
and betadine

Preliminary ultrasound was performed of the abdominal wall.  A
large complex septated abdominal wall subcutaneous fluid collection
is identified.  Under sterile conditions and local anesthesia,
direct ultrasound guidance was utilized to advance a 19 gauge
sheath needle into the collection.  There was return of amber
colored fluid.  A guide wire was advanced into the fluid collection
followed by tract dilatation to insert a 14-French drain.  Syringe
aspiration yielded 1.7 liters of amber colored seroma fluid.  No
evidence of infection or exudative component.  Post procedure
imaging demonstrates complete collapse of the abdominal wall
complex seroma.  Catheter was secured with a Prolene suture and
connected to external drainage.  Sterile dressing applied.  No
immediate complication.  The patient tolerated the procedure well.
IMPRESSION: Successful ultrasound abdominal wall recurrent seroma drain
placement.

## 2011-10-22 ENCOUNTER — Encounter (HOSPITAL_COMMUNITY)
Admission: RE | Admit: 2011-10-22 | Discharge: 2011-10-22 | Disposition: A | Discharge status: Home / Self Care | Payer: Medicare HMO | Source: Ambulatory Visit | Attending: Nephrology | Admitting: Nephrology

## 2011-10-22 DIAGNOSIS — D638 Anemia in other chronic diseases classified elsewhere: Secondary | ICD-10-CM | POA: Insufficient documentation

## 2011-10-22 DIAGNOSIS — N183 Chronic kidney disease, stage 3 unspecified: Secondary | ICD-10-CM | POA: Insufficient documentation

## 2011-10-22 LAB — POCT HEMOGLOBIN-HEMACUE: Hemoglobin: 8.7 g/dL — ABNORMAL LOW (ref 12.0–15.0)

## 2011-10-22 MED ORDER — EPOETIN ALFA 20000 UNIT/ML IJ SOLN
INTRAMUSCULAR | Status: AC
  Filled 2011-10-22: qty 1

## 2011-10-22 MED ORDER — EPOETIN ALFA 10000 UNIT/ML IJ SOLN
30000.0000 [IU] | INTRAMUSCULAR | Status: DC
  Administered 2011-10-22: 10000 [IU] via SUBCUTANEOUS
  Filled 2011-10-22: qty 1

## 2011-10-22 MED ORDER — EPOETIN ALFA 20000 UNIT/ML IJ SOLN
INTRAMUSCULAR | Status: AC
  Administered 2011-10-22: 20000 [IU] via SUBCUTANEOUS
  Filled 2011-10-22: qty 1

## 2011-10-23 ENCOUNTER — Encounter: Payer: Self-pay | Admitting: Vascular Surgery

## 2011-10-24 ENCOUNTER — Ambulatory Visit (INDEPENDENT_AMBULATORY_CARE_PROVIDER_SITE_OTHER): Payer: Medicare HMO | Admitting: Vascular Surgery

## 2011-10-24 ENCOUNTER — Encounter: Payer: Self-pay | Admitting: Vascular Surgery

## 2011-10-24 VITALS — BP 139/82 | HR 63 | Resp 16 | Ht 65.0 in | Wt 283.6 lb

## 2011-10-24 DIAGNOSIS — Z01810 Encounter for preprocedural cardiovascular examination: Secondary | ICD-10-CM

## 2011-10-24 DIAGNOSIS — N184 Chronic kidney disease, stage 4 (severe): Secondary | ICD-10-CM

## 2011-10-24 NOTE — Progress Notes (Signed)
[facility]   Postoperative Access Visit  History of Present Illness  [name] is a 66 y.o. year old female who presents for postoperative follow-up for: L 1st BVT (Date: 09/30/11).  The patient's wounds are healed.  The patient notes no steal symptoms.  The patient is able to complete their activities of daily living.  The patient's current symptoms are: none.  Unfortunately, last week the patient's BVT thrombosed.  Physical Examination  Filed Vitals:   10/24/11 1016  BP: 139/82  Pulse: 63  Resp: 16   LUE: Incision is  healed, skin feels warm, hand grip is 5/5, sensation in digits is intact, no palpable thrill, bruit cannot be auscultated   Medical Decision Making  [name] is a 66 y.o. year old female who presents s/p failed L 1st stage BVT.  Given the PAD in her brachial artery, I'm not surprised that this patient's BVT failed.  I would obtain BUE arterial duplexes to evaluate both arms for arterial insufficiency.  The next access will be placed in the arm with the better arterial source.  She will follow up with [place] in the next two weeks with that study.  Adele Barthel, MD [facility] [place] Office: [phone] Pager: [phone]

## 2011-10-24 NOTE — Addendum Note (Signed)
Addended by: Mena Goes on: 10/24/2011 01:59 PM   Modules accepted: Orders

## 2011-10-31 ENCOUNTER — Ambulatory Visit: Payer: Medicare (Managed Care) | Admitting: Vascular Surgery

## 2011-11-05 ENCOUNTER — Encounter (HOSPITAL_COMMUNITY)
Admission: RE | Admit: 2011-11-05 | Discharge: 2011-11-05 | Disposition: A | Discharge status: Home / Self Care | Payer: Medicare HMO | Source: Ambulatory Visit | Attending: Nephrology | Admitting: Nephrology

## 2011-11-05 MED ORDER — EPOETIN ALFA 10000 UNIT/ML IJ SOLN
30000.0000 [IU] | INTRAMUSCULAR | Status: DC
  Administered 2011-11-05: 10000 [IU] via SUBCUTANEOUS

## 2011-11-05 MED ORDER — EPOETIN ALFA 10000 UNIT/ML IJ SOLN
INTRAMUSCULAR | Status: AC
  Filled 2011-11-05: qty 1

## 2011-11-05 MED ORDER — EPOETIN ALFA 20000 UNIT/ML IJ SOLN
INTRAMUSCULAR | Status: AC
  Administered 2011-11-05: 20000 [IU] via SUBCUTANEOUS
  Filled 2011-11-05: qty 1

## 2011-11-06 ENCOUNTER — Encounter: Payer: Self-pay | Admitting: Vascular Surgery

## 2011-11-07 ENCOUNTER — Encounter: Payer: Self-pay | Admitting: Vascular Surgery

## 2011-11-07 ENCOUNTER — Encounter (INDEPENDENT_AMBULATORY_CARE_PROVIDER_SITE_OTHER): Payer: Medicare HMO | Admitting: *Deleted

## 2011-11-07 ENCOUNTER — Ambulatory Visit (INDEPENDENT_AMBULATORY_CARE_PROVIDER_SITE_OTHER): Payer: Medicare HMO | Admitting: Vascular Surgery

## 2011-11-07 ENCOUNTER — Encounter (HOSPITAL_COMMUNITY): Payer: Self-pay | Admitting: *Deleted

## 2011-11-07 ENCOUNTER — Other Ambulatory Visit: Payer: Self-pay

## 2011-11-07 VITALS — BP 143/71 | HR 64 | Resp 16 | Ht 65.0 in | Wt 277.0 lb

## 2011-11-07 DIAGNOSIS — Z48812 Encounter for surgical aftercare following surgery on the circulatory system: Secondary | ICD-10-CM

## 2011-11-07 DIAGNOSIS — Z01810 Encounter for preprocedural cardiovascular examination: Secondary | ICD-10-CM

## 2011-11-07 DIAGNOSIS — N186 End stage renal disease: Secondary | ICD-10-CM

## 2011-11-07 DIAGNOSIS — N184 Chronic kidney disease, stage 4 (severe): Secondary | ICD-10-CM

## 2011-11-07 NOTE — Progress Notes (Signed)
Spoke to [place] at [facility], requested orders for surgery.

## 2011-11-07 NOTE — Progress Notes (Signed)
[facility] [place]  Postoperative Access Visit  History of Present Illness  [name] is a 66 y.o. year old female who presents for postoperative follow-up for: failed L 1st stage BRVT  (Date: 09/30/11).  The patient's wounds are healed.  The patient notes no steal symptoms.  The patient is able to complete their activities of daily living.  The patient's current symptoms are: none.  Pt is still not on hemodialysis.  Past Medical History  Diagnosis Date  . Arthritis   . Abdominal abscess 12-17-10    abdominal abscesses x2 ? one at this time  . Staph aureus infection November 2012   . Myocardial infarction 12-17-10    80's-abnormal testing showed  . Swelling of both ankles 12-17-10  . Blood transfusion, without reported diagnosis 12-17-10    "thinks in [facility]  . Chronic kidney disease   . Chronic renal insufficiency 2009    sees Dr. Florene Glen  every 4-5 mo  . Anemia   . Diabetes mellitus     type 2 IDDM x 6-7 years  . Coronary artery disease     Dr Kadakia-cardiologist 2-3 x year  . CHF (congestive heart failure)   . Hypertension   . Diabetes mellitus   . Dysrhythmia     irregular, "skips beats"  . Shortness of breath   . Sleep apnea 2012    CPAP    Past Surgical History  Procedure Date  . Ventral hernia repair     component separation, repair with biologic  . Vaginal hysterectomy   . Cardiac catheterization 12-17-10    '80's  . Abdominal hysterectomy 12-17-10    Vaginal Hysterectomy  . Cataract extraction w/ intraocular lens implant 12-17-10    bilateral  . Laparotomy 12/18/2010    Procedure: EXPLORATORY LAPAROTOMY;  Surgeon: Rolm Bookbinder, MD;  Location: [facility];  Service: General;  Laterality: N/A;  Abdominal Seroma Evacuation  . Back surgery   . Lumbar disc surgery 1970'-80's    X 3  . Eye surgery   . Wound debridement 03/20/2011    Procedure: DEBRIDEMENT ABDOMINAL WOUND;  Surgeon: Rolm Bookbinder, MD;  Location: [place];  Service: General;  Laterality: N/A;  debridement abdominal wall, placement of wound vac  . Abdominal hysterectomy   . Cataract extraction, bilateral   . Av fistula placement 09/30/2011    Procedure: ARTERIOVENOUS (AV) FISTULA CREATION;  Surgeon: Conrad Moquino, MD;  Location: [place];  Service: Vascular;  Laterality: Left;  BRACHIAL-CEPHALIC    History   Social History  . Marital Status: Widowed    Spouse Name: N/A    Number of Children: N/A  . Years of Education: N/A   Occupational History  . Not on file.   Social History Main Topics  . Smoking status: Former Smoker    Types: Cigarettes    Quit date: 12/16/1980  . Smokeless tobacco: Never Used  . Alcohol Use: No  . Drug Use: No  . Sexually Active: No   Other Topics Concern  . Not on file   Social History Narrative   ** Merged History Encounter **     Family History  Problem Relation Age of Onset  . Hypertension Mother   . Cancer Brother     spine  . Cancer Sister     brain  . Hypertension Maternal Grandmother   . Anesthesia problems Neg Hx   . Hypertension Father     Current Outpatient Prescriptions on File Prior  to Visit  Medication Sig Dispense Refill  . amLODipine (NORVASC) 10 MG tablet Take 10 mg by mouth daily.       Marland Kitchen amLODipine (NORVASC) 5 MG tablet       . aspirin EC 81 MG tablet Take 81 mg by mouth daily.        Marland Kitchen atropine 1 % ophthalmic solution Place 1 drop into the right eye daily.       . AVELOX 400 MG tablet       . BAYER BREEZE 2 TEST DISK       . bisacodyl (DULCOLAX) 5 MG EC tablet Take 5 mg by mouth daily as needed. For constipation      . Blood Glucose Calibration (BAYER BREEZE 2 CONTROL) NORMAL LIQD       . Blood Glucose Calibration (TAI DOC CONTROL) NORMAL SOLN       . Blood Glucose Monitoring Suppl (CLEVER CHEK AUTO-CODE VOICE) DEVI       . Blood Glucose Monitoring Suppl (FORA V12 BLOOD GLUCOSE SYSTEM) DEVI       . calcitRIOL (ROCALTROL) 0.25 MCG capsule Take 0.25 mcg by mouth daily.       . calcitRIOL (ROCALTROL) 0.5 MCG capsule       . Calcium Carbonate-Vitamin D (CALCIUM 600 + D PO) Take 1 tablet by mouth daily.       . carvedilol (COREG) 25 MG tablet Take 25 mg by mouth 2 (two) times daily.      Marland Kitchen CLEVER CHEK AUTO-CODE VOICE test strip       . cloNIDine (CATAPRES) 0.2 MG tablet       . cloNIDine (CATAPRES) 0.3 MG tablet Take 0.3 mg by mouth 2 (two) times daily.      . colchicine 0.6 MG tablet Take 0.6 mg by mouth daily.      Marland Kitchen docusate sodium (COLACE) 100 MG capsule Take 100 mg by mouth daily as needed. For constipation      . ferrous sulfate 325 (65 FE) MG tablet Take 325 mg by mouth daily with breakfast.        . furosemide (LASIX) 80 MG tablet Take 80 mg by mouth 2 (two) times daily.       . hydrALAZINE (APRESOLINE) 25 MG tablet Take 25 mg by mouth 2 (two) times daily.      . hydrALAZINE (APRESOLINE) 50 MG tablet       . HYDROcodone-acetaminophen (NORCO) 10-325 MG per tablet Take 1-2 tablets by mouth every 6 (six) hours as needed. For pain      . insulin glargine (LANTUS) 100 UNIT/ML injection Inject 2-5 Units into the skin at bedtime.       . insulin lispro (HUMALOG) 100 UNIT/ML injection Inject 5 Units into the skin 2 (two) times daily.       . isosorbide mononitrate (IMDUR) 60 MG 24 hr tablet Take 60 mg by mouth daily.       Elmore Gu[employer] (LANCING DEVICE) MISC       . Lite Touch Lancets MISC       . metoprolol succinate (TOPROL-XL) 50 MG 24 hr tablet       . Multiple Vitamin (MULITIVITAMIN WITH MINERALS) TABS Take 1 tablet by mouth daily.      . mupirocin ointment (BACTROBAN) 2 % Apply 1 application topically 2 (two) times daily. Apply to bilateral nares for positive PCR      . NITROSTAT 0.4 MG SL tablet Take 0.4 mg by mouth every  5 (five) minutes as needed. For chest pain      . oxyCODONE (OXY IR/ROXICODONE) 5 MG immediate release tablet       . oxyCODONE-acetaminophen (PERCOCET) 10-325 MG per tablet Take 1 tablet by mouth every 6 (six) hours as needed for pain.   20 tablet  0  . Pharmacist Choice Lancets MISC       . pioglitazone (ACTOS) 15 MG tablet Take 15 mg by mouth daily.      . prednisoLONE acetate (PRED FORTE) 1 % ophthalmic suspension Place 1 drop into the right eye daily.       . predniSONE (DELTASONE) 20 MG tablet       . rosuvastatin (CRESTOR) 20 MG tablet Take 20 mg by mouth daily.      [employer]aird [employer] ophthalmic ointment       . VIGAMOX 0.5 % ophthalmic solution Place 1 drop into the right eye daily.         No Known Allergies  REVIEW OF SYSTEMS: (Positives indicated with an "x", otherwise negative)  CARDIOVASCULAR: [ ]  chest pain [ ]  chest pressure [ ]  palpitations [x]  orthopnea [ ]  dyspnea on exert. [ ]  claudication [ ]  rest pain [ ]  DVT [x]  swelling in legs  PULMONARY: [ ]  productive cough [ ]  asthma [ ]  wheezing  NEUROLOGIC: [x]  weakness in arms or legs [ ]  paresthesias [ ]  aphasia [x]  temporary loss of vision in eye [x]  dizziness  HEMATOLOGIC: [ ]  bleeding problems [ ]  clotting disorders  MUSCULOSKEL: [ ]  joint pain [ ]  joint swelling  GASTROINTEST: [ ]  blood in stool [ ]  hematemesis  GENITOURINARY: [ ]  dysuria [ ]  hematuria  PSYCHIATRIC: [ ]  history of major depression  INTEGUMENTARY: [ ]  rashes [ ]  ulcers  CONSTITUTIONAL: [ ]  fever [ ]  chills   Physical Examination  Filed Vitals:   11/07/11 1024  BP: 143/71  Pulse: 64  Resp: 16   PULM CTAB CV RRR LUE: Incision is healed, skin feels warm, hand grip is 5/5, sensation in digits is intact, no thrill or bruit, 3 mm basilic vein on Sonosite today  BUE arterial duplex (Date: 11/07/11)  R: PSV 136-71 c/s (peak in SCA), high bifurcation, triphasic throughout  L: PSV 218-55 c/s, normal bifurcation, triphasic throughout  Medical Decision Making  LANY DEVAUL is a 66 y.o. year old female who presents s/p failed L 1st stage BRVT.  Right arm high bifurcation and poor vein caliber make the right arm a poor choice for next access.  Unfortunately some degree of L SCA  stenosis suggested, so will plan on L arm angiogram during the next access.  Basilic vein was not visualized during the original procedure but is readily evident more proximally today, suspect the distal diameter is smaller.  Will plan on L arm angiogram, possible intervention, L 1st stage BVT this coming Monday 21st OCT 13.  Adele Barthel, MD [facility] [place] Office: [phone] Pager: [phone]

## 2011-11-08 IMAGING — CR DG CHEST 2V
1 series · 1 of 1 positions shown · non-contrast
Comparison: 04/25/2010

CLINICAL DATA: Abdominal pain.  Shortness of breath.

CHEST - 2 VIEW

[view not recorded]
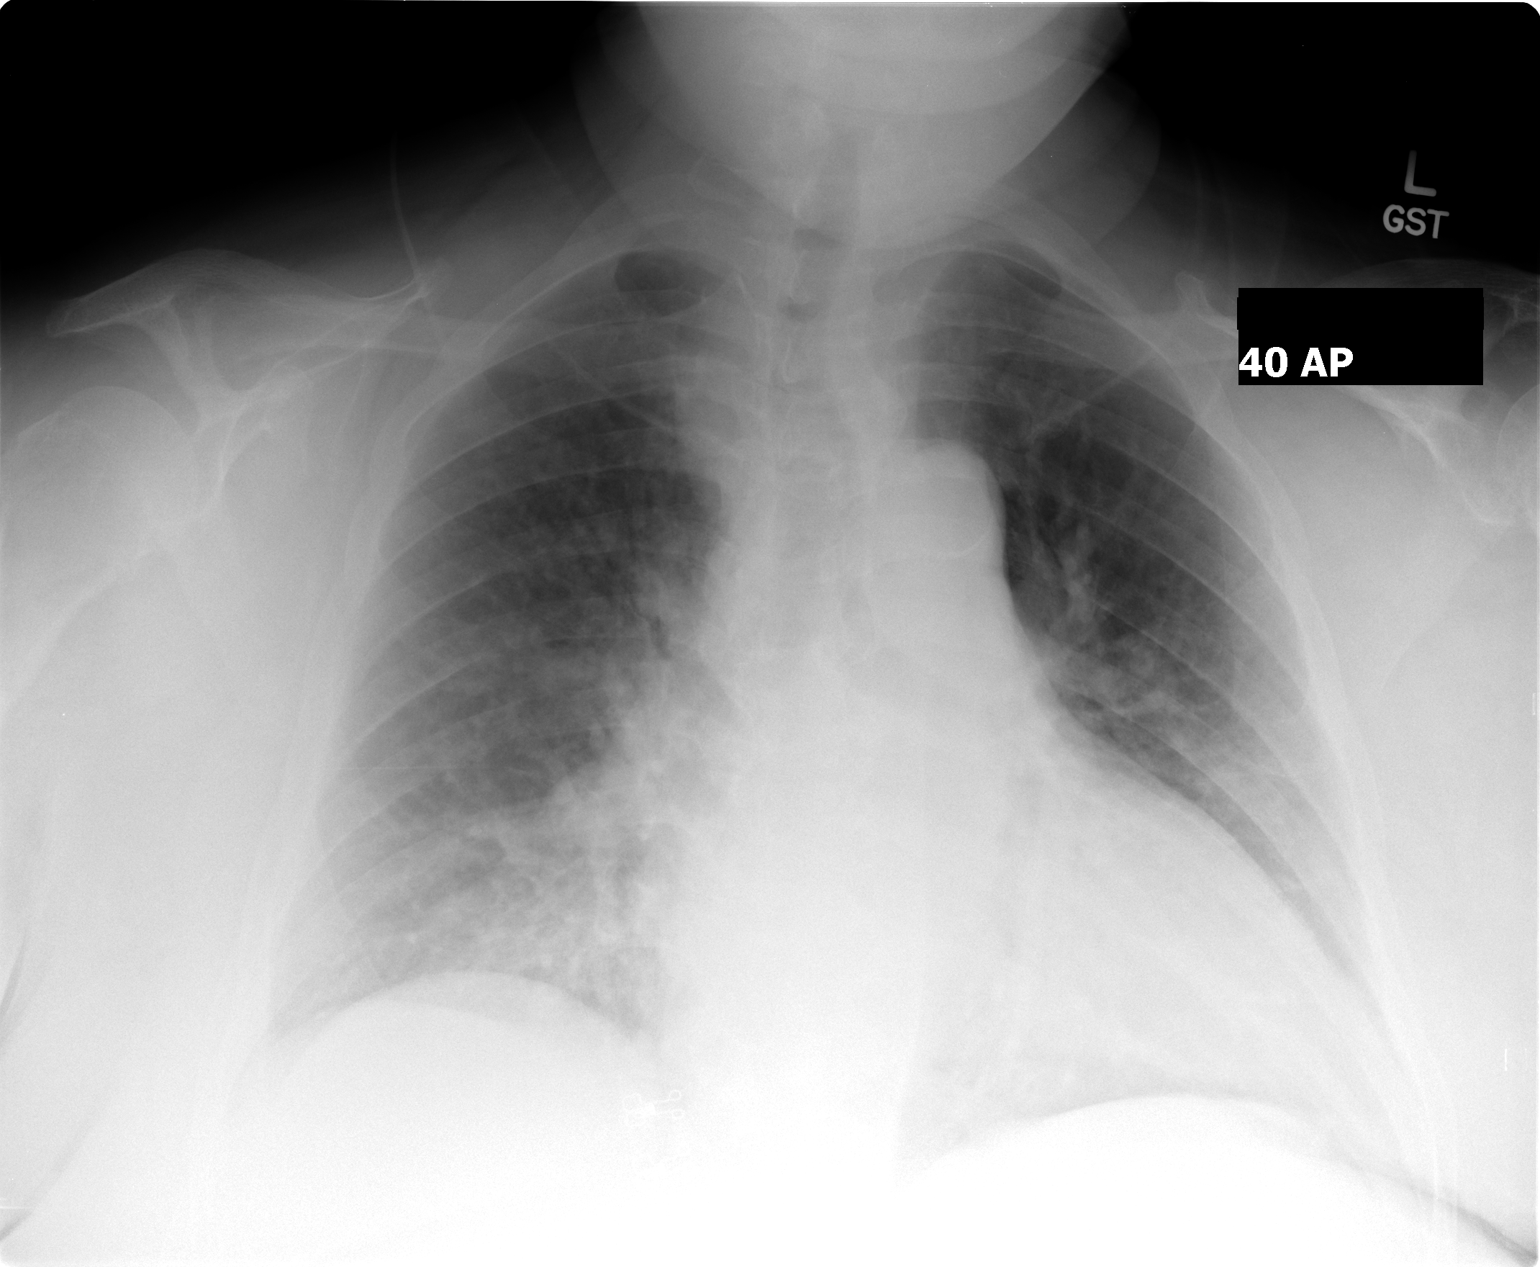

[1 of 1 positions shown; findings below may reference images not displayed]

FINDINGS: The heart is enlarged.  There is mild pulmonary vascular
congestion.  No overt edema.  There is minimal right lower lobe
atelectasis.  No focal consolidations or pleural effusions.
Degenerative changes are seen in the spine.  No evidence for free
intraperitoneal air beneath the diaphragm.
IMPRESSION: 1.  Cardiomegaly and pulmonary vascular congestion.
2. No focal pulmonary abnormality.

## 2011-11-09 IMAGING — CT CT ABD-PELV W/O CM
2 of 4 series · 17 of 46 positions shown, 19 images · non-contrast
Comparison: 06/27/2010.

CLINICAL DATA: Severe lower abdominal pain.  History of hernia
surgery 2 months ago.  Recent drain placement.

CT ABDOMEN AND PELVIS WITHOUT CONTRAST
TECHNIQUE: Multidetector CT imaging of the abdomen and pelvis was
performed following the standard protocol without intravenous
contrast.

[Series 2: abd/pelv w/o 5.0 b31f st · axial · non-contrast · 0.93mm/px · z∈[+876,+1340]mm · 14 of 103 slices shown, 16 images]
[im 5/103  soft-tissue]
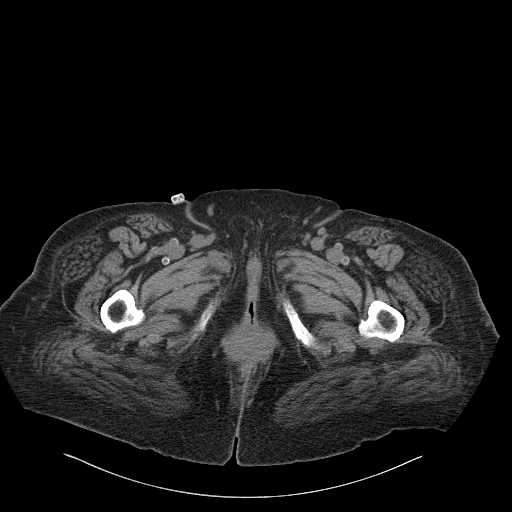
[im 5/103  bone]
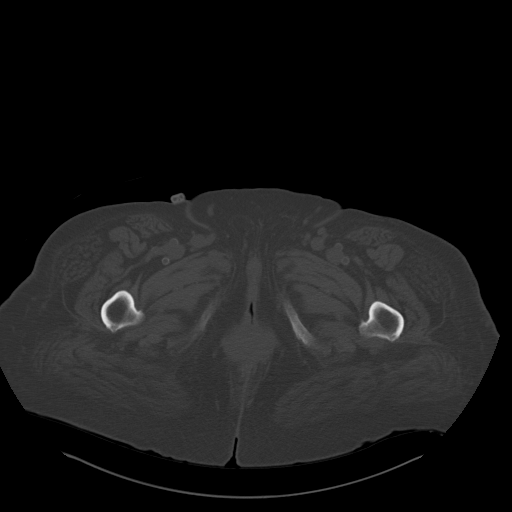
[im 14/103  soft-tissue]
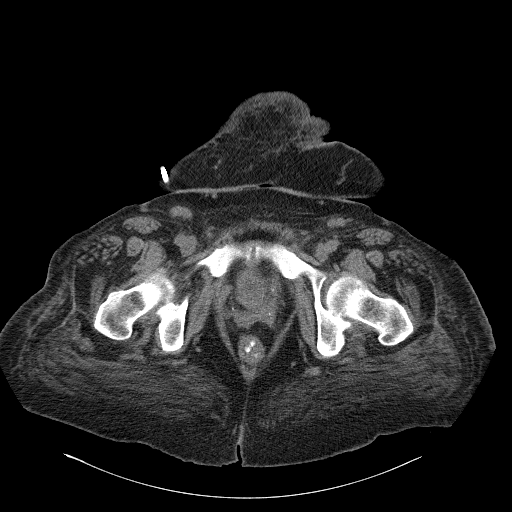
[im 18/103  soft-tissue]
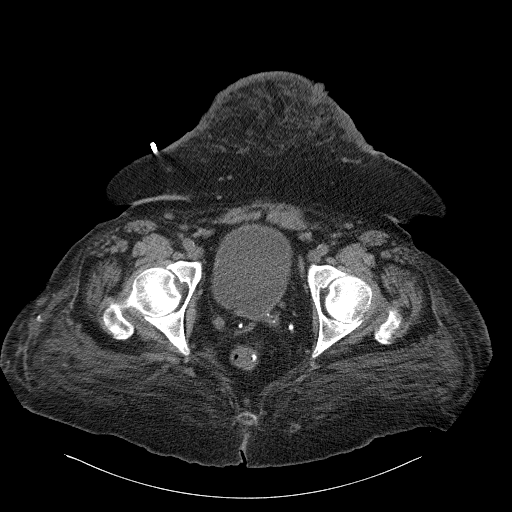
[im 27/103  soft-tissue]
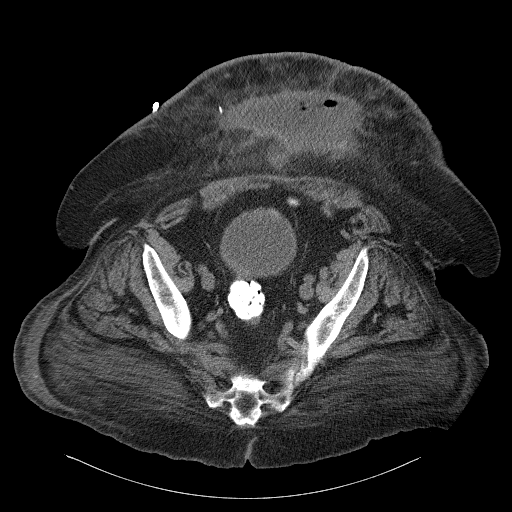
[im 36/103  soft-tissue]
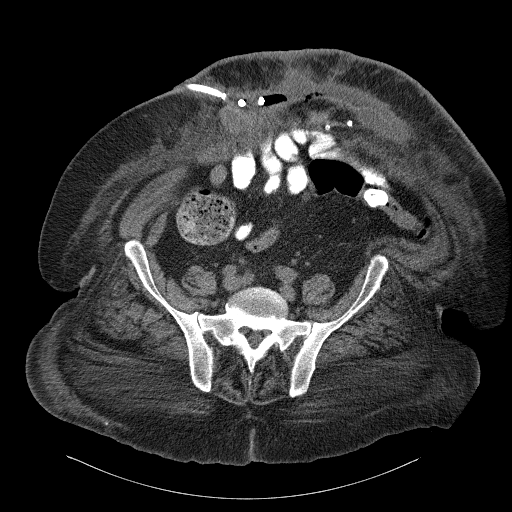
[im 40/103  soft-tissue]
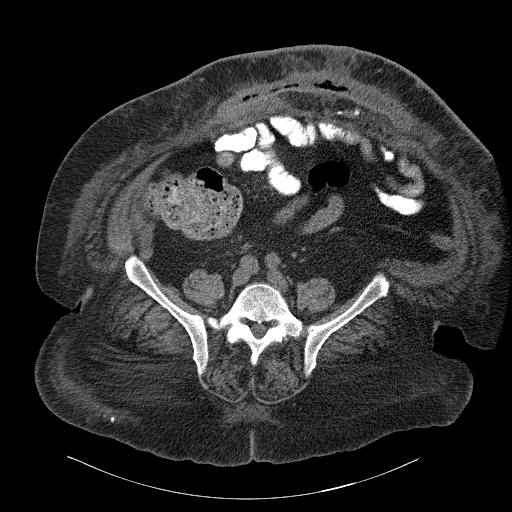
[im 49/103  soft-tissue]
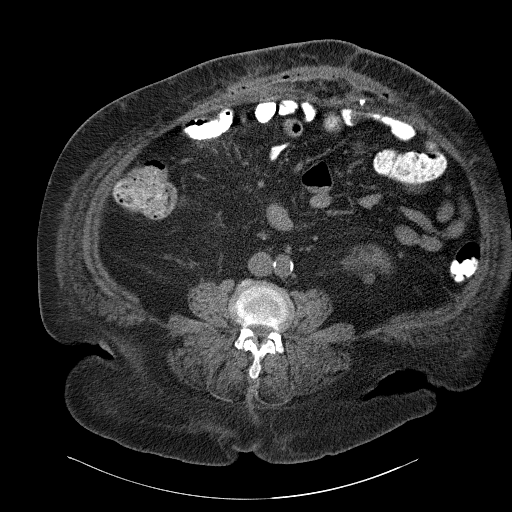
[im 54/103  soft-tissue]
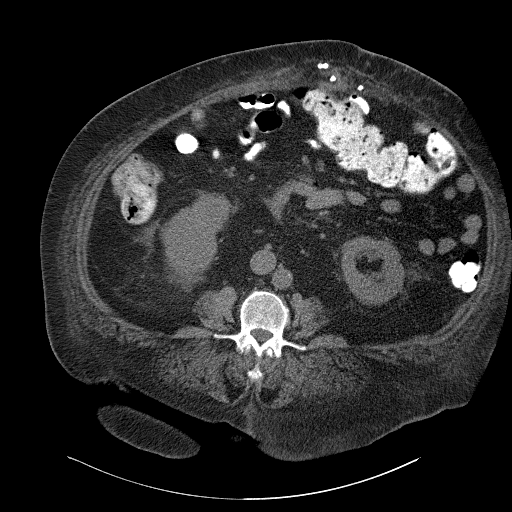
[im 63/103  soft-tissue]
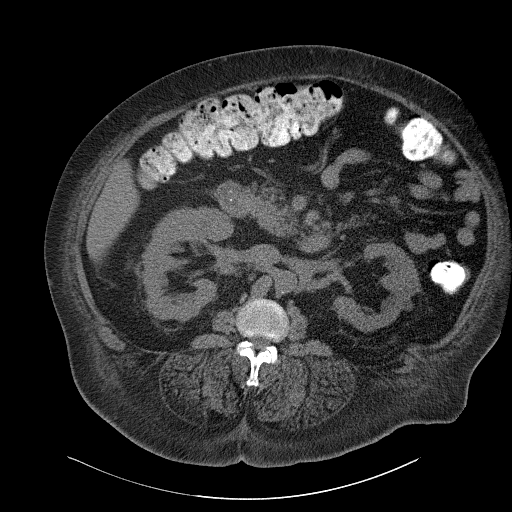
[im 63/103  bone]
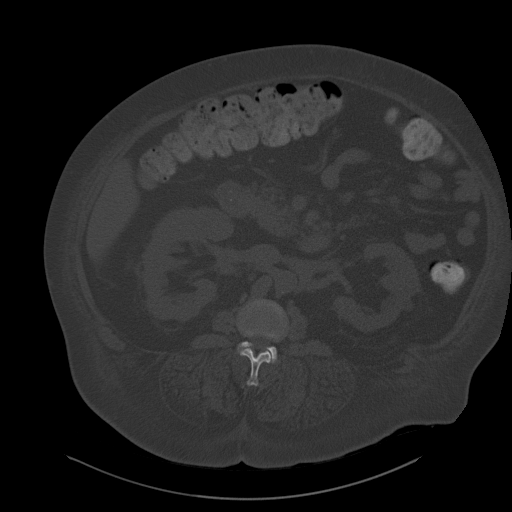
[im 67/103  soft-tissue]
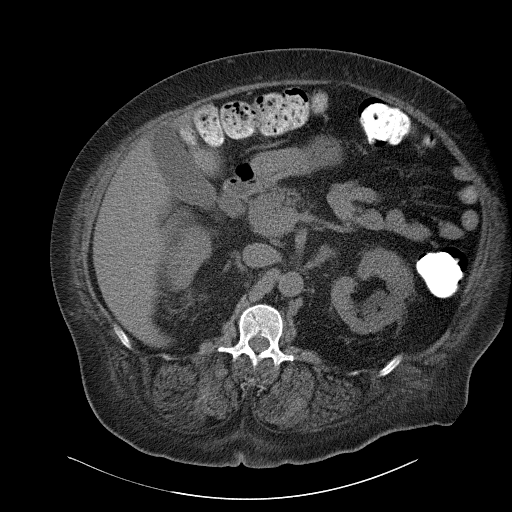
[im 76/103  soft-tissue]
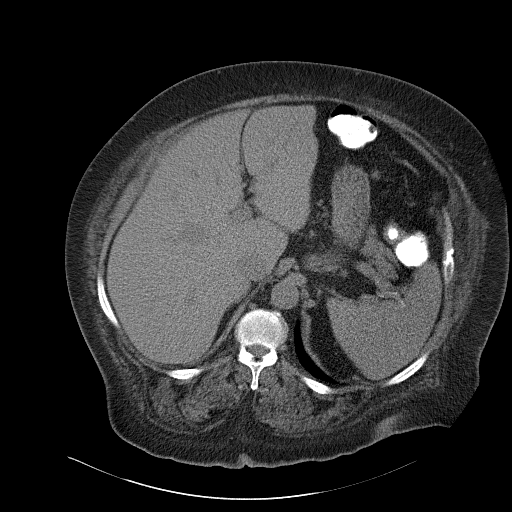
[im 85/103  soft-tissue]
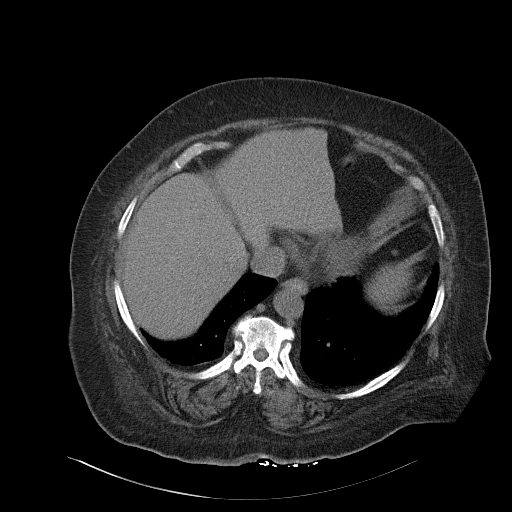
[im 89/103  soft-tissue]
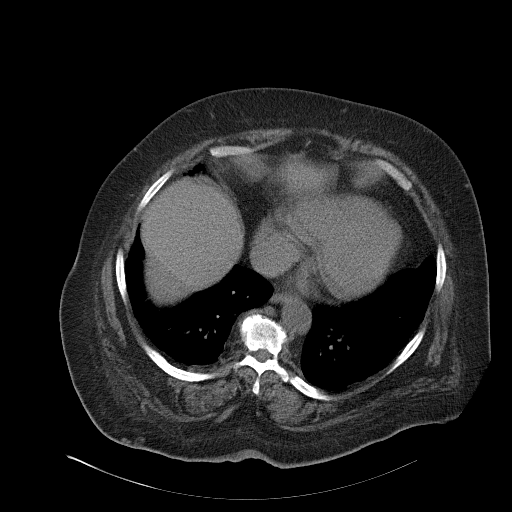
[im 98/103  soft-tissue]
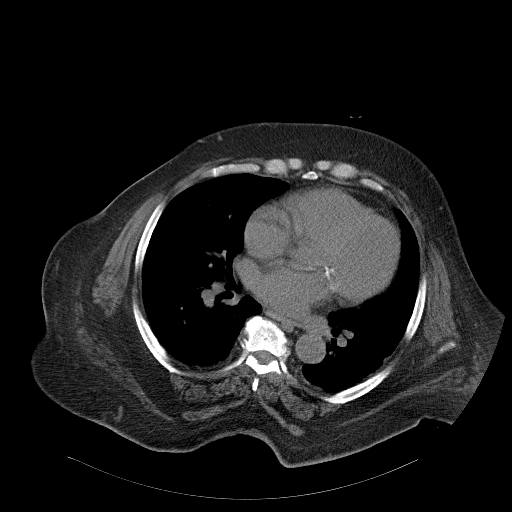

[Series 602: cor · coronal · 1.01mm/px · 3 of 191 slices shown]
[im 64/191  soft-tissue]
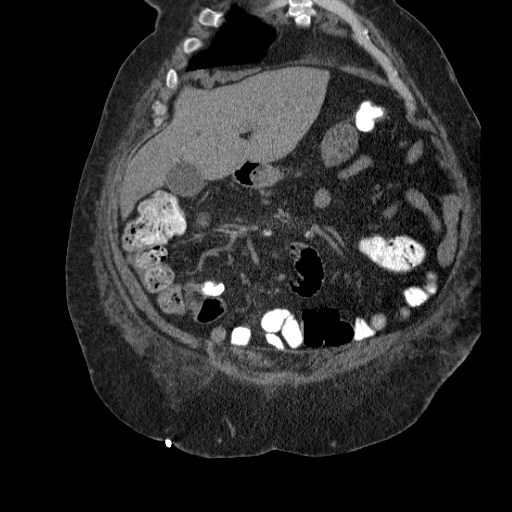
[im 85/191  soft-tissue]
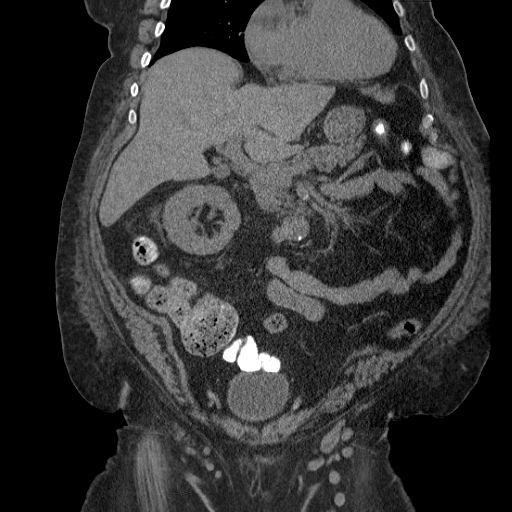
[im 106/191  soft-tissue]
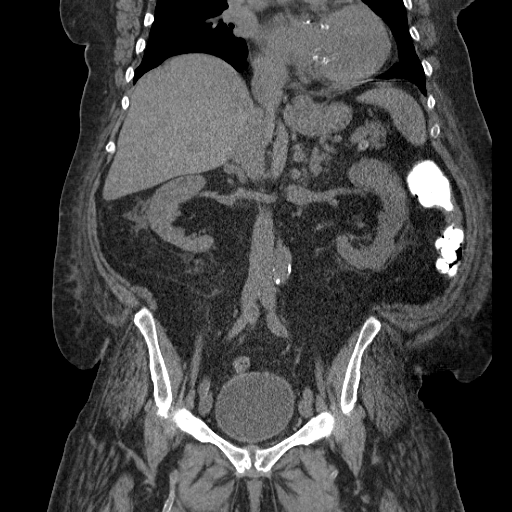

[17 of 46 positions shown; findings below may reference images not displayed]

FINDINGS: In the interval since the prior exam, there is a
percutaneous pigtail catheter drain in the anterior abdominal wall
abscess.  The abscess measures 6 cm AP by 13.5 cm transverse on
axial imaging.  Craniocaudal extent is slightly smaller at 15 cm,
using abdominal wall anchors as a reference.   Surrounding phlegmon
remains present.  No extension into the peritoneum.  Solid and
hollow abdominal viscera are unchanged compared to prior exam.  No
bowel obstruction.  Anasarca changes are present in the soft
tissues.
IMPRESSION: 1.  Little interval change in the anterior abdominal wall/pannus
abscess.  Pigtail drainage catheter is in pigtail drainage catheter
is in the right inferior aspect of the abscess.
2.  No acute intra-abdominal abnormality.

## 2011-11-09 IMAGING — CR DG CHEST 2V
2 series · 2 of 2 positions shown · non-contrast
Comparison: 07/01/2010

CLINICAL DATA: Abdominal pain and fever.  Shortness of breath.
History of hypertension, CHF, diabetes.

CHEST - 2 VIEW

[w chest pa]
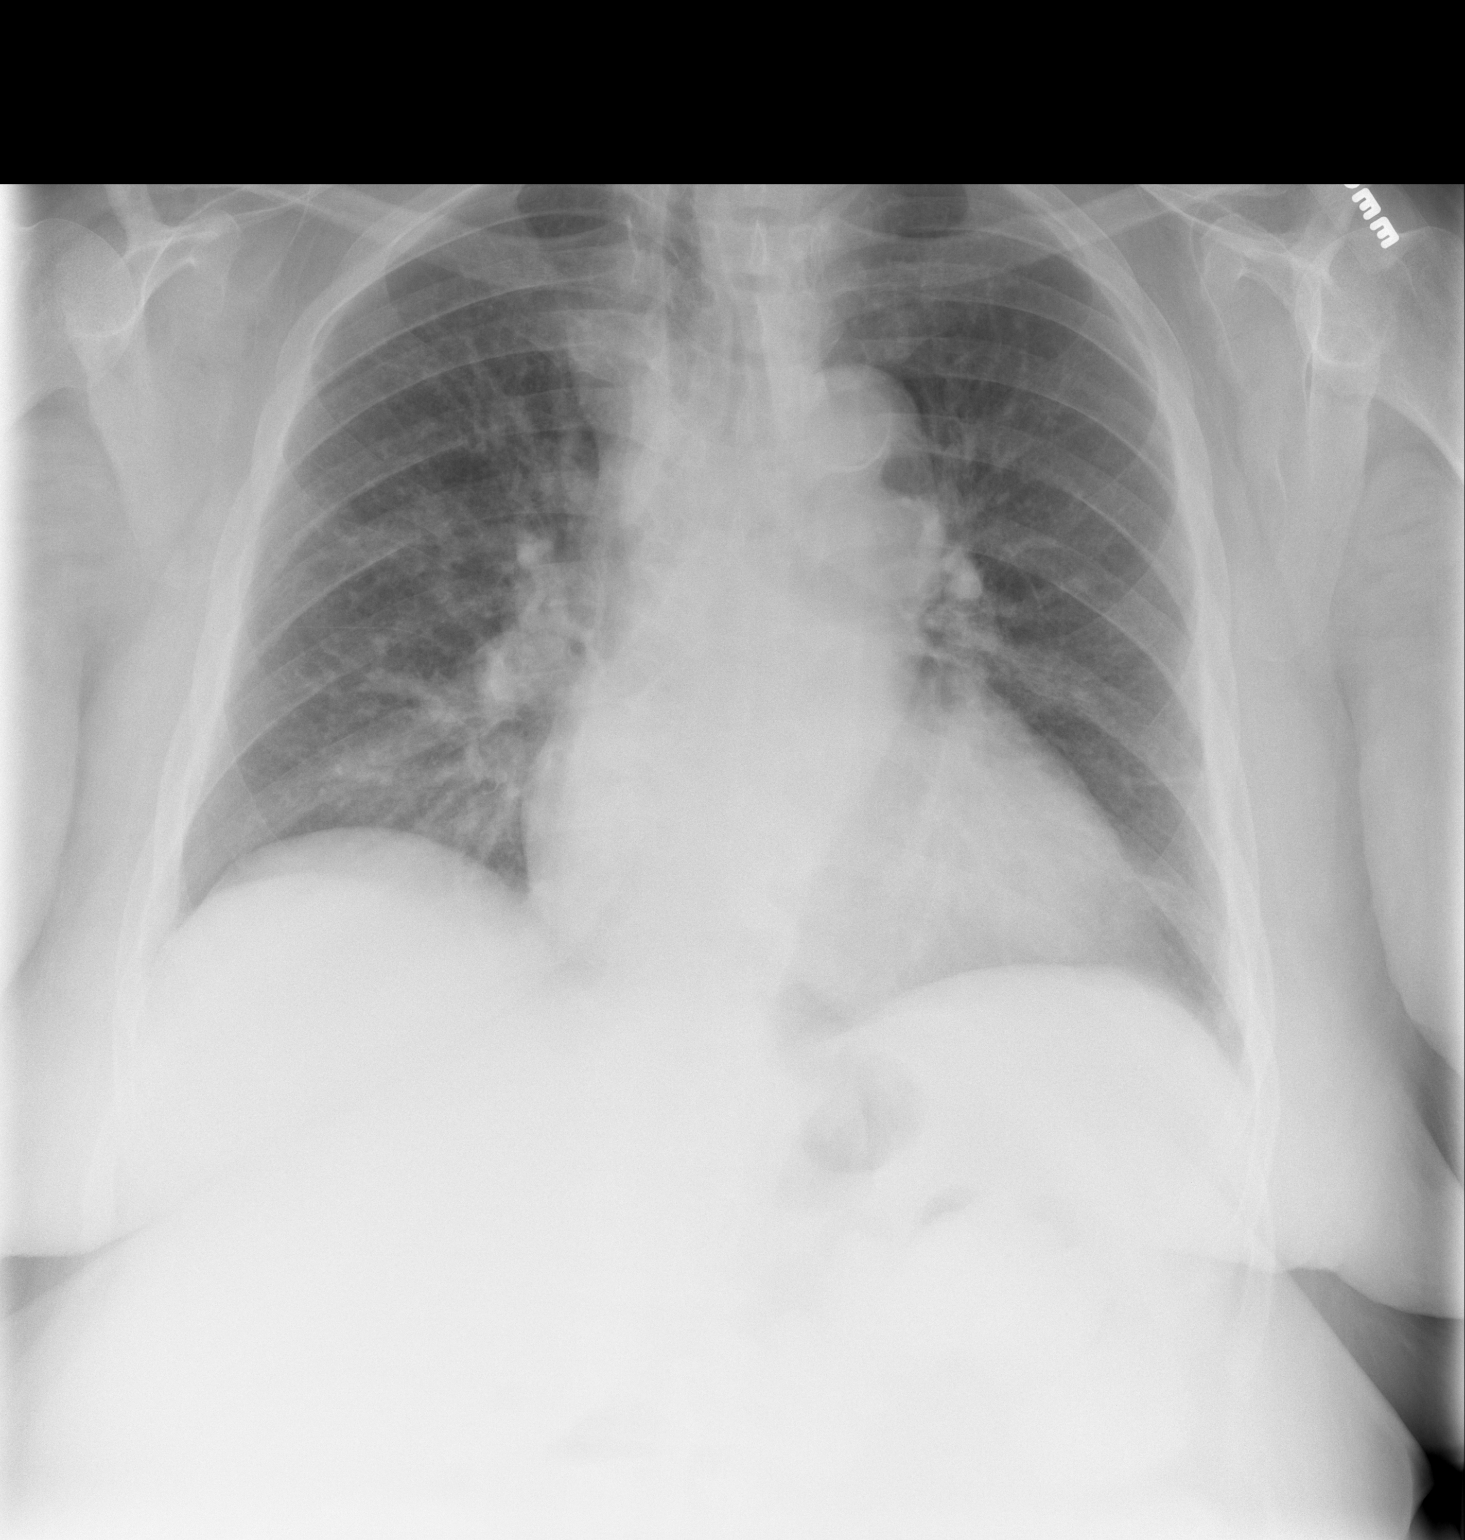

[w chest lat *]
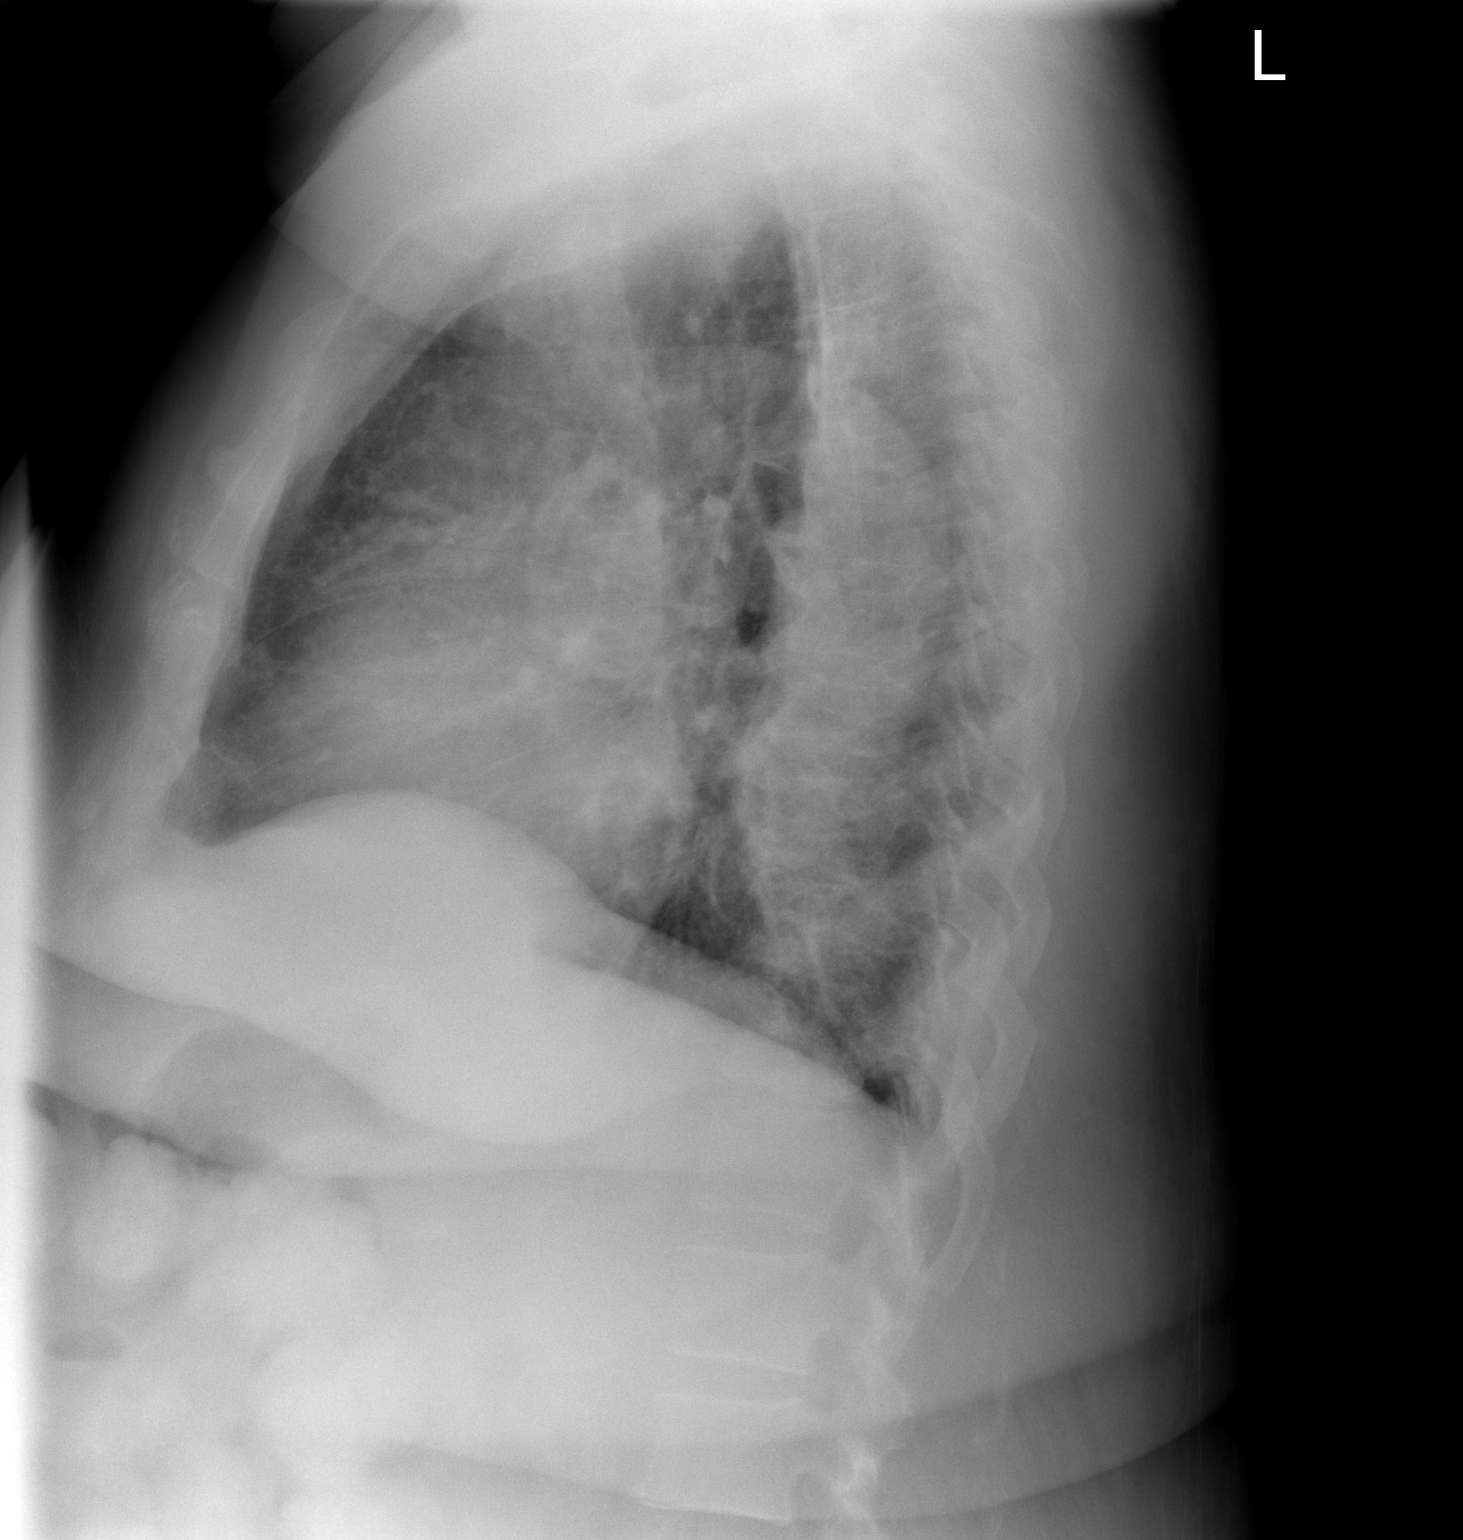

[2 of 2 positions shown; findings below may reference images not displayed]

FINDINGS: The heart is enlarged.  There is minimal left base
atelectasis.  There is mild prominence of interstitial markings.
No overt edema.  No focal consolidations or pleural effusions.
Degenerative changes are seen in the spine.
IMPRESSION: 1.  Cardiomegaly without evidence for edema.
2. No focal pulmonary abnormality.

## 2011-11-09 MED ORDER — DEXTROSE 5 % IV SOLN
3.0000 g | Freq: Once | INTRAVENOUS | Status: AC
  Administered 2011-11-10: 3 g via INTRAVENOUS
  Filled 2011-11-09 (×2): qty 3000

## 2011-11-10 ENCOUNTER — Encounter (HOSPITAL_COMMUNITY): Payer: Self-pay | Admitting: Critical Care Medicine

## 2011-11-10 ENCOUNTER — Telehealth: Payer: Self-pay | Admitting: Vascular Surgery

## 2011-11-10 ENCOUNTER — Ambulatory Visit (HOSPITAL_COMMUNITY)
Admission: RE | Admit: 2011-11-10 | Discharge: 2011-11-10 | Disposition: A | Discharge status: Home / Self Care | Payer: Medicare HMO | Source: Ambulatory Visit | Attending: Vascular Surgery | Admitting: Vascular Surgery

## 2011-11-10 ENCOUNTER — Encounter (HOSPITAL_COMMUNITY)
Admission: RE | Disposition: A | Discharge status: Home / Self Care | Payer: Self-pay | Source: Ambulatory Visit | Attending: Vascular Surgery

## 2011-11-10 ENCOUNTER — Ambulatory Visit (HOSPITAL_COMMUNITY): Payer: Medicare HMO | Admitting: Critical Care Medicine

## 2011-11-10 ENCOUNTER — Ambulatory Visit (HOSPITAL_COMMUNITY): Payer: Medicare HMO

## 2011-11-10 DIAGNOSIS — N186 End stage renal disease: Secondary | ICD-10-CM

## 2011-11-10 DIAGNOSIS — N185 Chronic kidney disease, stage 5: Secondary | ICD-10-CM | POA: Insufficient documentation

## 2011-11-10 DIAGNOSIS — J449 Chronic obstructive pulmonary disease, unspecified: Secondary | ICD-10-CM | POA: Insufficient documentation

## 2011-11-10 DIAGNOSIS — J4489 Other specified chronic obstructive pulmonary disease: Secondary | ICD-10-CM | POA: Insufficient documentation

## 2011-11-10 DIAGNOSIS — Z794 Long term (current) use of insulin: Secondary | ICD-10-CM | POA: Insufficient documentation

## 2011-11-10 DIAGNOSIS — I120 Hypertensive chronic kidney disease with stage 5 chronic kidney disease or end stage renal disease: Secondary | ICD-10-CM | POA: Insufficient documentation

## 2011-11-10 DIAGNOSIS — E119 Type 2 diabetes mellitus without complications: Secondary | ICD-10-CM | POA: Insufficient documentation

## 2011-11-10 HISTORY — PX: BASCILIC VEIN TRANSPOSITION: SHX5742

## 2011-11-10 LAB — GLUCOSE, CAPILLARY: Glucose-Capillary: 136 mg/dL — ABNORMAL HIGH (ref 70–99)

## 2011-11-10 LAB — POCT I-STAT 4, (NA,K, GLUC, HGB,HCT)
Glucose, Bld: 143 mg/dL — ABNORMAL HIGH (ref 70–99)
Hemoglobin: 8.5 g/dL — ABNORMAL LOW (ref 12.0–15.0)

## 2011-11-10 LAB — SURGICAL PCR SCREEN
MRSA, PCR: NEGATIVE
Staphylococcus aureus: NEGATIVE

## 2011-11-10 SURGERY — TRANSPOSITION, VEIN, BASILIC
Anesthesia: General | Site: Arm Upper | Laterality: Left | Wound class: Clean

## 2011-11-10 MED ORDER — IODIXANOL 320 MG/ML IV SOLN
INTRAVENOUS | Status: DC | PRN
  Administered 2011-11-10: 100 mL via INTRAVENOUS

## 2011-11-10 MED ORDER — SODIUM CHLORIDE 0.9 % IV SOLN
INTRAVENOUS | Status: DC | PRN
  Administered 2011-11-10: 07:00:00 via INTRAVENOUS

## 2011-11-10 MED ORDER — THROMBIN 20000 UNITS EX SOLR
CUTANEOUS | Status: AC
  Filled 2011-11-10: qty 20000

## 2011-11-10 MED ORDER — PROTAMINE SULFATE 10 MG/ML IV SOLN
INTRAVENOUS | Status: DC | PRN
  Administered 2011-11-10 (×3): 10 mg via INTRAVENOUS

## 2011-11-10 MED ORDER — LIDOCAINE-EPINEPHRINE (PF) 1 %-1:200000 IJ SOLN
INTRAMUSCULAR | Status: AC
  Filled 2011-11-10: qty 10

## 2011-11-10 MED ORDER — OXYCODONE HCL 5 MG PO TABS: 5.0000 mg | ORAL_TABLET | ORAL | Status: DC | PRN

## 2011-11-10 MED ORDER — MUPIROCIN 2 % EX OINT
TOPICAL_OINTMENT | Freq: Two times a day (BID) | CUTANEOUS | Status: DC
  Administered 2011-11-10: 1 via NASAL

## 2011-11-10 MED ORDER — FENTANYL CITRATE 0.05 MG/ML IJ SOLN: 50.0000 ug | Freq: Once | INTRAMUSCULAR | Status: DC

## 2011-11-10 MED ORDER — OXYCODONE HCL 5 MG/5ML PO SOLN: 5.0000 mg | Freq: Once | ORAL | Status: DC | PRN

## 2011-11-10 MED ORDER — SODIUM CHLORIDE 0.9 % IR SOLN
Status: DC | PRN
  Administered 2011-11-10: 08:00:00

## 2011-11-10 MED ORDER — THROMBIN 20000 UNITS EX KIT: PACK | CUTANEOUS | Status: DC | PRN

## 2011-11-10 MED ORDER — FENTANYL CITRATE 0.05 MG/ML IJ SOLN
INTRAMUSCULAR | Status: DC | PRN
  Administered 2011-11-10 (×4): 50 ug via INTRAVENOUS

## 2011-11-10 MED ORDER — ONDANSETRON HCL 4 MG/2ML IJ SOLN
INTRAMUSCULAR | Status: DC | PRN
  Administered 2011-11-10: 4 mg via INTRAVENOUS

## 2011-11-10 MED ORDER — 0.9 % SODIUM CHLORIDE (POUR BTL) OPTIME
TOPICAL | Status: DC | PRN
  Administered 2011-11-10: 1000 mL

## 2011-11-10 MED ORDER — PROMETHAZINE HCL 25 MG/ML IJ SOLN: 6.2500 mg | INTRAMUSCULAR | Status: DC | PRN

## 2011-11-10 MED ORDER — LIDOCAINE HCL (CARDIAC) 20 MG/ML IV SOLN
INTRAVENOUS | Status: DC | PRN
  Administered 2011-11-10: 100 mg via INTRAVENOUS

## 2011-11-10 MED ORDER — HYDROMORPHONE HCL PF 1 MG/ML IJ SOLN
0.2500 mg | INTRAMUSCULAR | Status: DC | PRN
  Administered 2011-11-10 (×2): 0.25 mg via INTRAVENOUS

## 2011-11-10 MED ORDER — PROPOFOL 10 MG/ML IV BOLUS
INTRAVENOUS | Status: DC | PRN
  Administered 2011-11-10: 200 mg via INTRAVENOUS

## 2011-11-10 MED ORDER — MIDAZOLAM HCL 5 MG/5ML IJ SOLN
INTRAMUSCULAR | Status: DC | PRN
  Administered 2011-11-10: 2 mg via INTRAVENOUS

## 2011-11-10 MED ORDER — BUPIVACAINE HCL (PF) 0.5 % IJ SOLN
INTRAMUSCULAR | Status: AC
  Filled 2011-11-10: qty 30

## 2011-11-10 MED ORDER — MUPIROCIN 2 % EX OINT
TOPICAL_OINTMENT | CUTANEOUS | Status: AC
  Filled 2011-11-10: qty 22

## 2011-11-10 MED ORDER — OXYCODONE HCL 5 MG PO TABS: 5.0000 mg | ORAL_TABLET | Freq: Once | ORAL | Status: DC | PRN

## 2011-11-10 MED ORDER — SODIUM CHLORIDE 0.9 % IV SOLN: INTRAVENOUS | Status: DC

## 2011-11-10 MED ORDER — HYDROMORPHONE HCL PF 1 MG/ML IJ SOLN
INTRAMUSCULAR | Status: AC
  Administered 2011-11-10: 0.25 mg via INTRAVENOUS
  Filled 2011-11-10: qty 1

## 2011-11-10 MED ORDER — HEPARIN SODIUM (PORCINE) 1000 UNIT/ML IJ SOLN
INTRAMUSCULAR | Status: DC | PRN
  Administered 2011-11-10: 5000 [IU] via INTRAVENOUS

## 2011-11-10 MED ORDER — THROMBIN 20000 UNITS EX SOLR
CUTANEOUS | Status: DC | PRN
  Administered 2011-11-10: 10:00:00 via TOPICAL

## 2011-11-10 MED ORDER — MIDAZOLAM HCL 2 MG/2ML IJ SOLN: 1.0000 mg | INTRAMUSCULAR | Status: DC | PRN

## 2011-11-10 SURGICAL SUPPLY — 52 items
CANISTER SUCTION 2500CC (MISCELLANEOUS) ×2 IMPLANT
CATH STRAIGHT 5FR 65CM (CATHETERS) ×2 IMPLANT
CLIP TI MEDIUM 24 (CLIP) ×2 IMPLANT
CLIP TI WIDE RED SMALL 24 (CLIP) ×2 IMPLANT
CLOTH BEACON ORANGE TIMEOUT ST (SAFETY) ×2 IMPLANT
CORDS BIPOLAR (ELECTRODE) IMPLANT
COVER PROBE W GEL 5X96 (DRAPES) ×2 IMPLANT
COVER SURGICAL LIGHT HANDLE (MISCELLANEOUS) ×2 IMPLANT
DECANTER SPIKE VIAL GLASS SM (MISCELLANEOUS) IMPLANT
DERMABOND ADVANCED (GAUZE/BANDAGES/DRESSINGS) ×1
DERMABOND ADVANCED .7 DNX12 (GAUZE/BANDAGES/DRESSINGS) ×1 IMPLANT
DRAIN PENROSE 1/2X12 LTX STRL (WOUND CARE) IMPLANT
DRAPE C-ARM 42X72 X-RAY (DRAPES) ×2 IMPLANT
ELECT REM PT RETURN 9FT ADLT (ELECTROSURGICAL) ×2
ELECTRODE REM PT RTRN 9FT ADLT (ELECTROSURGICAL) ×1 IMPLANT
GAUZE SPONGE 4X4 16PLY XRAY LF (GAUZE/BANDAGES/DRESSINGS) ×2 IMPLANT
GLOVE BIO SURGEON STRL SZ 6.5 (GLOVE) ×2 IMPLANT
GLOVE BIO SURGEON STRL SZ7 (GLOVE) ×2 IMPLANT
GLOVE BIO SURGEON STRL SZ8 (GLOVE) ×2 IMPLANT
GLOVE BIOGEL PI IND STRL 6.5 (GLOVE) ×4 IMPLANT
GLOVE BIOGEL PI IND STRL 7.0 (GLOVE) ×1 IMPLANT
GLOVE BIOGEL PI IND STRL 7.5 (GLOVE) ×1 IMPLANT
GLOVE BIOGEL PI INDICATOR 6.5 (GLOVE) ×4
GLOVE BIOGEL PI INDICATOR 7.0 (GLOVE) ×1
GLOVE BIOGEL PI INDICATOR 7.5 (GLOVE) ×1
GLOVE SURG SS PI 6.5 STRL IVOR (GLOVE) ×6 IMPLANT
GOWN PREVENTION PLUS XLARGE (GOWN DISPOSABLE) ×2 IMPLANT
GOWN STRL NON-REIN LRG LVL3 (GOWN DISPOSABLE) ×10 IMPLANT
KIT BASIN OR (CUSTOM PROCEDURE TRAY) ×2 IMPLANT
KIT ROOM TURNOVER OR (KITS) ×2 IMPLANT
NS IRRIG 1000ML POUR BTL (IV SOLUTION) ×2 IMPLANT
PACK CV ACCESS (CUSTOM PROCEDURE TRAY) ×2 IMPLANT
PAD ARMBOARD 7.5X6 YLW CONV (MISCELLANEOUS) ×6 IMPLANT
SET COLLECT BLD 21X3/4 12 PB (MISCELLANEOUS) ×2 IMPLANT
SET MICROPUNCTURE 5F STIFF (MISCELLANEOUS) ×2 IMPLANT
SHEATH AVANTI 11CM 5FR (MISCELLANEOUS) ×2 IMPLANT
SPONGE SURGIFOAM ABS GEL 100 (HEMOSTASIS) IMPLANT
STOPCOCK 4 WAY LG BORE MALE ST (IV SETS) ×2 IMPLANT
SUT MNCRL AB 4-0 PS2 18 (SUTURE) ×2 IMPLANT
SUT PROLENE 6 0 BV (SUTURE) ×6 IMPLANT
SUT PROLENE 7 0 BV 1 (SUTURE) ×4 IMPLANT
SUT SILK 2 0 SH (SUTURE) ×2 IMPLANT
SUT VIC AB 3-0 SH 27 (SUTURE) ×2
SUT VIC AB 3-0 SH 27X BRD (SUTURE) ×2 IMPLANT
SYR 30ML LL (SYRINGE) ×2 IMPLANT
SYRINGE 10CC LL (SYRINGE) ×2 IMPLANT
TOWEL OR 17X24 6PK STRL BLUE (TOWEL DISPOSABLE) ×2 IMPLANT
TOWEL OR 17X26 10 PK STRL BLUE (TOWEL DISPOSABLE) ×2 IMPLANT
TUBING EXTENTION W/L.L. (IV SETS) ×2 IMPLANT
UNDERPAD 30X30 INCONTINENT (UNDERPADS AND DIAPERS) ×2 IMPLANT
WATER STERILE IRR 1000ML POUR (IV SOLUTION) ×2 IMPLANT
WIRE BENTSON .035X145CM (WIRE) ×2 IMPLANT

## 2011-11-10 NOTE — H&P (View-Only) (Signed)
[facility] [place]  Postoperative Access Visit  History of Present Illness  [name] is a 66 y.o. year old female who presents for postoperative follow-up for: failed L 1st stage BRVT  (Date: 09/30/11).  The patient's wounds are healed.  The patient notes no steal symptoms.  The patient is able to complete their activities of daily living.  The patient's current symptoms are: none.  Pt is still not on hemodialysis.  Past Medical History  Diagnosis Date  . Arthritis   . Abdominal abscess 12-17-10    abdominal abscesses x2 ? one at this time  . Staph aureus infection November 2012   . Myocardial infarction 12-17-10    80's-abnormal testing showed  . Swelling of both ankles 12-17-10  . Blood transfusion, without reported diagnosis 12-17-10    "thinks in [facility]  . Chronic kidney disease   . Chronic renal insufficiency 2009    sees Dr. Florene Glen  every 4-5 mo  . Anemia   . Diabetes mellitus     type 2 IDDM x 6-7 years  . Coronary artery disease     Dr Kadakia-cardiologist 2-3 x year  . CHF (congestive heart failure)   . Hypertension   . Diabetes mellitus   . Dysrhythmia     irregular, "skips beats"  . Shortness of breath   . Sleep apnea 2012    CPAP    Past Surgical History  Procedure Date  . Ventral hernia repair     component separation, repair with biologic  . Vaginal hysterectomy   . Cardiac catheterization 12-17-10    '80's  . Abdominal hysterectomy 12-17-10    Vaginal Hysterectomy  . Cataract extraction w/ intraocular lens implant 12-17-10    bilateral  . Laparotomy 12/18/2010    Procedure: EXPLORATORY LAPAROTOMY;  Surgeon: Rolm Bookbinder, MD;  Location: [facility];  Service: General;  Laterality: N/A;  Abdominal Seroma Evacuation  . Back surgery   . Lumbar disc surgery 1970'-80's    X 3  . Eye surgery   . Wound debridement 03/20/2011    Procedure: DEBRIDEMENT ABDOMINAL WOUND;  Surgeon: Rolm Bookbinder, MD;  Location: [place];  Service: General;  Laterality: N/A;  debridement abdominal wall, placement of wound vac  . Abdominal hysterectomy   . Cataract extraction, bilateral   . Av fistula placement 09/30/2011    Procedure: ARTERIOVENOUS (AV) FISTULA CREATION;  Surgeon: Conrad Midway, MD;  Location: [place];  Service: Vascular;  Laterality: Left;  BRACHIAL-CEPHALIC    History   Social History  . Marital Status: Widowed    Spouse Name: N/A    Number of Children: N/A  . Years of Education: N/A   Occupational History  . Not on file.   Social History Main Topics  . Smoking status: Former Smoker    Types: Cigarettes    Quit date: 12/16/1980  . Smokeless tobacco: Never Used  . Alcohol Use: No  . Drug Use: No  . Sexually Active: No   Other Topics Concern  . Not on file   Social History Narrative   ** Merged History Encounter **     Family History  Problem Relation Age of Onset  . Hypertension Mother   . Cancer Brother     spine  . Cancer Sister     brain  . Hypertension Maternal Grandmother   . Anesthesia problems Neg Hx   . Hypertension Father     Current Outpatient Prescriptions on File Prior  to Visit  Medication Sig Dispense Refill  . amLODipine (NORVASC) 10 MG tablet Take 10 mg by mouth daily.       Marland Kitchen amLODipine (NORVASC) 5 MG tablet       . aspirin EC 81 MG tablet Take 81 mg by mouth daily.        Marland Kitchen atropine 1 % ophthalmic solution Place 1 drop into the right eye daily.       . AVELOX 400 MG tablet       . BAYER BREEZE 2 TEST DISK       . bisacodyl (DULCOLAX) 5 MG EC tablet Take 5 mg by mouth daily as needed. For constipation      . Blood Glucose Calibration (BAYER BREEZE 2 CONTROL) NORMAL LIQD       . Blood Glucose Calibration (TAI DOC CONTROL) NORMAL SOLN       . Blood Glucose Monitoring Suppl (CLEVER CHEK AUTO-CODE VOICE) DEVI       . Blood Glucose Monitoring Suppl (FORA V12 BLOOD GLUCOSE SYSTEM) DEVI       . calcitRIOL (ROCALTROL) 0.25 MCG capsule Take 0.25 mcg by mouth daily.       . calcitRIOL (ROCALTROL) 0.5 MCG capsule       . Calcium Carbonate-Vitamin D (CALCIUM 600 + D PO) Take 1 tablet by mouth daily.       . carvedilol (COREG) 25 MG tablet Take 25 mg by mouth 2 (two) times daily.      Marland Kitchen CLEVER CHEK AUTO-CODE VOICE test strip       . cloNIDine (CATAPRES) 0.2 MG tablet       . cloNIDine (CATAPRES) 0.3 MG tablet Take 0.3 mg by mouth 2 (two) times daily.      . colchicine 0.6 MG tablet Take 0.6 mg by mouth daily.      Marland Kitchen docusate sodium (COLACE) 100 MG capsule Take 100 mg by mouth daily as needed. For constipation      . ferrous sulfate 325 (65 FE) MG tablet Take 325 mg by mouth daily with breakfast.        . furosemide (LASIX) 80 MG tablet Take 80 mg by mouth 2 (two) times daily.       . hydrALAZINE (APRESOLINE) 25 MG tablet Take 25 mg by mouth 2 (two) times daily.      . hydrALAZINE (APRESOLINE) 50 MG tablet       . HYDROcodone-acetaminophen (NORCO) 10-325 MG per tablet Take 1-2 tablets by mouth every 6 (six) hours as needed. For pain      . insulin glargine (LANTUS) 100 UNIT/ML injection Inject 2-5 Units into the skin at bedtime.       . insulin lispro (HUMALOG) 100 UNIT/ML injection Inject 5 Units into the skin 2 (two) times daily.       . isosorbide mononitrate (IMDUR) 60 MG 24 hr tablet Take 60 mg by mouth daily.       Elmore Guise Devices (LANCING DEVICE) MISC       . Lite Touch Lancets MISC       . metoprolol succinate (TOPROL-XL) 50 MG 24 hr tablet       . Multiple Vitamin (MULITIVITAMIN WITH MINERALS) TABS Take 1 tablet by mouth daily.      . mupirocin ointment (BACTROBAN) 2 % Apply 1 application topically 2 (two) times daily. Apply to bilateral nares for positive PCR      . NITROSTAT 0.4 MG SL tablet Take 0.4 mg by mouth every  5 (five) minutes as needed. For chest pain      . oxyCODONE (OXY IR/ROXICODONE) 5 MG immediate release tablet       . oxyCODONE-acetaminophen (PERCOCET) 10-325 MG per tablet Take 1 tablet by mouth every 6 (six) hours as needed for pain.   20 tablet  0  . Pharmacist Choice Lancets MISC       . pioglitazone (ACTOS) 15 MG tablet Take 15 mg by mouth daily.      . prednisoLONE acetate (PRED FORTE) 1 % ophthalmic suspension Place 1 drop into the right eye daily.       . predniSONE (DELTASONE) 20 MG tablet       . rosuvastatin (CRESTOR) 20 MG tablet Take 20 mg by mouth daily.      Baird [employer] ophthalmic ointment       . VIGAMOX 0.5 % ophthalmic solution Place 1 drop into the right eye daily.         No Known Allergies  REVIEW OF SYSTEMS: (Positives indicated with an "x", otherwise negative)  CARDIOVASCULAR: [ ]  chest pain [ ]  chest pressure [ ]  palpitations [x]  orthopnea [ ]  dyspnea on exert. [ ]  claudication [ ]  rest pain [ ]  DVT [x]  swelling in legs  PULMONARY: [ ]  productive cough [ ]  asthma [ ]  wheezing  NEUROLOGIC: [x]  weakness in arms or legs [ ]  paresthesias [ ]  aphasia [x]  temporary loss of vision in eye [x]  dizziness  HEMATOLOGIC: [ ]  bleeding problems [ ]  clotting disorders  MUSCULOSKEL: [ ]  joint pain [ ]  joint swelling  GASTROINTEST: [ ]  blood in stool [ ]  hematemesis  GENITOURINARY: [ ]  dysuria [ ]  hematuria  PSYCHIATRIC: [ ]  history of major depression  INTEGUMENTARY: [ ]  rashes [ ]  ulcers  CONSTITUTIONAL: [ ]  fever [ ]  chills   Physical Examination  Filed Vitals:   11/07/11 1024  BP: 143/71  Pulse: 64  Resp: 16   PULM CTAB CV RRR LUE: Incision is healed, skin feels warm, hand grip is 5/5, sensation in digits is intact, no thrill or bruit, 3 mm basilic vein on Sonosite today  BUE arterial duplex (Date: 11/07/11)  R: PSV 136-71 c/s (peak in SCA), high bifurcation, triphasic throughout  L: PSV 218-55 c/s, normal bifurcation, triphasic throughout  Medical Decision Making  AARSHI PRECHTL is a 66 y.o. year old female who presents s/p failed L 1st stage BRVT.  Right arm high bifurcation and poor vein caliber make the right arm a poor choice for next access.  Unfortunately some degree of L SCA  stenosis suggested, so will plan on L arm angiogram during the next access.  Basilic vein was not visualized during the original procedure but is readily evident more proximally today, suspect the distal diameter is smaller.  Will plan on L arm angiogram, possible intervention, L 1st stage BVT this coming Monday 21st OCT 13.  Adele Barthel, MD [facility] [place] Office: [phone] Pager: [phone]

## 2011-11-10 NOTE — Anesthesia Postprocedure Evaluation (Signed)
  Anesthesia Post-op Note  Patient: [name]  Procedure(s) Performed: Procedure(s) (LRB) with comments: [place] (Left) - Ultrasound Guided, 1St Stage UPPER EXTREMITY ANGIOGRAM (Left) - Intraoperative  Patient Location: PACU  Anesthesia Type: General  Level of Consciousness: awake, alert  and oriented  Airway and Oxygen Therapy: Patient Spontanous Breathing and Patient connected to nasal cannula oxygen  Post-op Pain: mild  Post-op Assessment: Post-op Vital signs reviewed and Patient's Cardiovascular Status Stable  Post-op Vital Signs: stable  Complications: No apparent anesthesia complications

## 2011-11-10 NOTE — Anesthesia Procedure Notes (Addendum)
Procedure Name: LMA Insertion Date/Time: 11/10/2011 7:41 AM Performed by: Carola Frost Pre-anesthesia Checklist: Patient identified, Timeout performed, Emergency Drugs available, Suction available and Patient being monitored Patient Re-evaluated:Patient Re-evaluated prior to inductionOxygen Delivery Method: Circle system utilized Preoxygenation: Pre-oxygenation with 100% oxygen Intubation Type: IV induction LMA: LMA with gastric port inserted LMA Size: 5.0 Number of attempts: 1 Tube secured with: Tape

## 2011-11-10 NOTE — Telephone Encounter (Addendum)
Message copied by Lujean Amel on Mon Nov 10, 2011  3:12 PM ------      Message from: Alfonso Patten      Created: Mon Nov 10, 2011 10:40 AM                   ----- Message -----         From: Conrad [place], MD         Sent: 11/10/2011  10:29 AM           To: Patrici Ranks, Alfonso Patten, RN            Myrtis Ser      [id number]      Aug 20, 1945            PROCEDURE:      1. Left first stage basilic vein transposition (brachiobasilic arteriovenous fistula) placement      2. Open cannulation of brachial artery      3.  Left subclavian angiogram            Follow-up: 4 weeks            I scheduled an appt for this pt on 12/12/11 at 1pm. The pt is aware and I also mailed a letter.awt

## 2011-11-10 NOTE — Interval H&P Note (Signed)
[facility] [place]  History and Physical Update  The patient was interviewed and re-examined.  The patient's previous History and Physical has been reviewed and is unchanged.  There is no change in the plan of care: L 1st stage BVT.  Adele Barthel, MD [facility] [place] Office: [phone] Pager: [phone]  11/10/2011, 7:23 AM

## 2011-11-10 NOTE — Transfer of Care (Signed)
Immediate Anesthesia Transfer of Care Note  Patient: [name]  Procedure(s) Performed: Procedure(s) (LRB) with comments: [place] (Left) - Ultrasound Guided, 1St Stage UPPER EXTREMITY ANGIOGRAM (Left) - Intraoperative  Patient Location: PACU  Anesthesia Type: General  Level of Consciousness: awake, alert  and oriented  Airway & Oxygen Therapy: Patient Spontanous Breathing and Patient connected to nasal cannula oxygen  Post-op Assessment: Report given to PACU RN, Post -op Vital signs reviewed and stable and Patient moving all extremities X 4  Post vital signs: Reviewed and stable  Complications: No apparent anesthesia complications

## 2011-11-10 NOTE — Op Note (Addendum)
OPERATIVE NOTE   PROCEDURE: 1. Left first stage basilic vein transposition (brachiobasilic arteriovenous fistula) placement 2. Open cannulation of brachial artery 3. Left subclavian angiogram  PRE-OPERATIVE DIAGNOSIS: chronic kidney disease stage V   POST-OPERATIVE DIAGNOSIS: same as above   SURGEON: Adele Barthel, MD  ANESTHESIA: general  ESTIMATED BLOOD LOSS: 50 cc  FINDING(S): 1. Suboptimal imaging of left subclavian artery due to technical issues: visualized subclavian artery with <30% stenosis 2. Palpable thrill in fistula at end of case 3. Dopplerable left radial artery without augmentation with venous outflow compression  SPECIMEN(S):  none  INDICATIONS:   SYNDNEY [name] is a 66 y.o. female who presents with chronic kidney disease stage V .  The patient is scheduled for left first stage basilic vein transposition.  The patient is aware the risks include but are not limited to: bleeding, infection, steal syndrome, nerve damage, ischemic monomelic neuropathy, failure to mature, and need for additional procedures.  Additionally, due to subclavian artery stenosis on preoperative arterial duplex of left arm, I felt angiogram of the left subclavian artery was recommended.  I discussed with the patient the nature of angiographic procedures, especially the limited patencies of any endovascular intervention.  The patient is aware of that the risks of an angiographic procedure include but are not limited to: bleeding, infection, access site complications, renal failure, embolization, rupture of vessel, dissection, possible need for emergent surgical intervention, possible need for surgical procedures to treat the patient's pathology, and stroke and death.  The patient is aware of the risks and agrees to proceed.  DESCRIPTION: After full informed written consent was obtained from the patient, the patient was brought back to the operating room and placed supine upon the operating table.  Prior  to induction, the patient received IV antibiotics.   After obtaining adequate anesthesia, the patient was then prepped and draped in the standard fashion for a left arm access procedure.  I turned my attention first to identifying the patient's basilic vein and brachial artery.  Using SonoSite guidance, the location of these vessels were marked out on the skin.   I made a transverse incision at the level of the antecubitum and dissected through the subcutaneous tissue and fascia to gain exposure of the brachial artery.  This was noted to be 4-5 mm in diameter externally.  This was dissected out proximally and distally and controlled with vessel loops .  With great difficulty due to morbidly obesity, I then dissected out the basilic vein.  This was noted to be 3 mm in diameter externally.  At this point, I gave the patient 5000 units of Heparin intravenously to obtain some anticoagulation.  I cannulated the brachial artery with a micropuncture needle and then passed the microwire into the artery.  The microsheath was loaded over the wire into the artery.  The wire was exchanged for a [facility] wire, which was advanced into the aorta under fluoroscopy.  The sheath was exchanged for 5-Fr sheath.  A end-hole catheter was loaded over the wire and advanced into the aorta.  I removed the wire and then did hand injections of the aorta.  The portable C-arm was having difficulty completing subtracted images.  I pull the catheter back into the subclavian artery and did an injection under cine-loop.  There no obvious lesions with >30% stenosis, so immediate intervention was not needed.  I did another injection more distally but the fluoroscopy equipment failed to save any more images.  At this point, I elected to  abort the rest of this study due technical issues.  I turned my attention back to the surgical field.  The distal segment of the vein was ligated with a  2-0 silk, and the vein was transected.  The proximal segment was  iinterrogated with serial dilators.  The vein accepted up to a 4 mm dilator without any difficulty.  I then instilled the heparinized saline into the vein and clamped it.  At this point, I reset my exposure of the brachial artery and placed the artery under tension proximally and distally.   I pulled out the sheath and extended the arteriotomy with a Potts scissor.  I injected heparinized saline proximal and distal to this arteriotomy.  After re-examining the arteriotomy, I felt it was too aggressive, so I reapproximated a portion of the artery with a running 6-0 Prolene.  The vein was then sewn to the artery in an end-to-side configuration with a running stitch of 7-0 Prolene.  Prior to completing this anastomosis, I allowed the vein and artery to backbleed.  There was no evidence of clot from any vessels.  I completed the anastomosis in the usual fashion and then released all vessel loops and clamps.  There was a  palpable thrill in the venous outflow, and there was a doppler radial pulse which did not augment with compression.  At this point, I irrigated out the surgical wound.  There was no further active bleeding.  The subcutaneous tissue was reapproximated with a running stitch of 3-0 Vicryl.  The skin was then reapproximated with a running subcuticular stitch of 4-0 Vicryl.  The skin was then cleaned, dried, and reinforced with Dermabond.  The patient tolerated this procedure well.   COMPLICATIONS: none  CONDITION: stable  Adele Barthel, MD [facility] [place] Office: [phone] Pager: [phone]  11/10/2011, 10:18 AM

## 2011-11-10 NOTE — Anesthesia Preprocedure Evaluation (Addendum)
Anesthesia Evaluation  Patient identified by MRN, date of birth, ID band Patient awake    Reviewed: Allergy & Precautions, H&P , NPO status , Patient's Chart, lab work & pertinent test results, reviewed documented beta blocker date and time   History of Anesthesia Complications Negative for: history of anesthetic complications  Airway Mallampati: II TM Distance: >3 FB Neck ROM: Full    Dental  (+) Dental Advisory Given   Pulmonary shortness of breath, sleep apnea and Continuous Positive Airway Pressure Ventilation , COPDformer smoker,  + rhonchi   Pulmonary exam normal       Cardiovascular hypertension, Pt. on home beta blockers + CAD, + Past MI (2012) and +CHF - dysrhythmias Rhythm:Regular Rate:Normal  '07 cath: normal coronaries, normal LVF   Neuro/Psych negative neurological ROS  negative psych ROS   GI/Hepatic GERD-  Medicated and Controlled,  Endo/Other  diabetes, Type 2, Insulin Dependent and Oral Hypoglycemic AgentsMorbid obesity  Renal/GU ESRFRenal disease     Musculoskeletal   Abdominal (+) + obese,   Peds  Hematology  (+) Blood dyscrasia, anemia ,   Anesthesia Other Findings   Reproductive/Obstetrics                           Anesthesia Physical Anesthesia Plan  ASA: IV  Anesthesia Plan: General   Post-op Pain Management:    Induction: Intravenous  Airway Management Planned: Oral ETT and LMA  Additional Equipment:   Intra-op Plan:   Post-operative Plan: Extubation in OR  Informed Consent: I have reviewed the patients History and Physical, chart, labs and discussed the procedure including the risks, benefits and alternatives for the proposed anesthesia with the patient or authorized representative who has indicated his/her understanding and acceptance.   Dental advisory given  Plan Discussed with: Anesthesiologist and Surgeon  Anesthesia Plan Comments: (May need  OET-will attempt LMA due to COPD)       Anesthesia Quick Evaluation

## 2011-11-10 NOTE — Preoperative (Signed)
Beta Blockers   Reason not to administer Beta Blockers:Not Applicable, pt took [id number]

## 2011-11-11 IMAGING — US US ABSCESS DRAINAGE W/ CATHETER
1 series · 3 of 3 positions shown · non-contrast
Comparison: none

CLINICAL HISTORY: 65-year-old with an anterior subcutaneous tissue
abscess.  Residual fluid collection despite having one drain in
place.

[Series 1: us abscess drainage w/ catheter · 0.26mm/px · 3 of 3 slices shown]
[im 1/3]
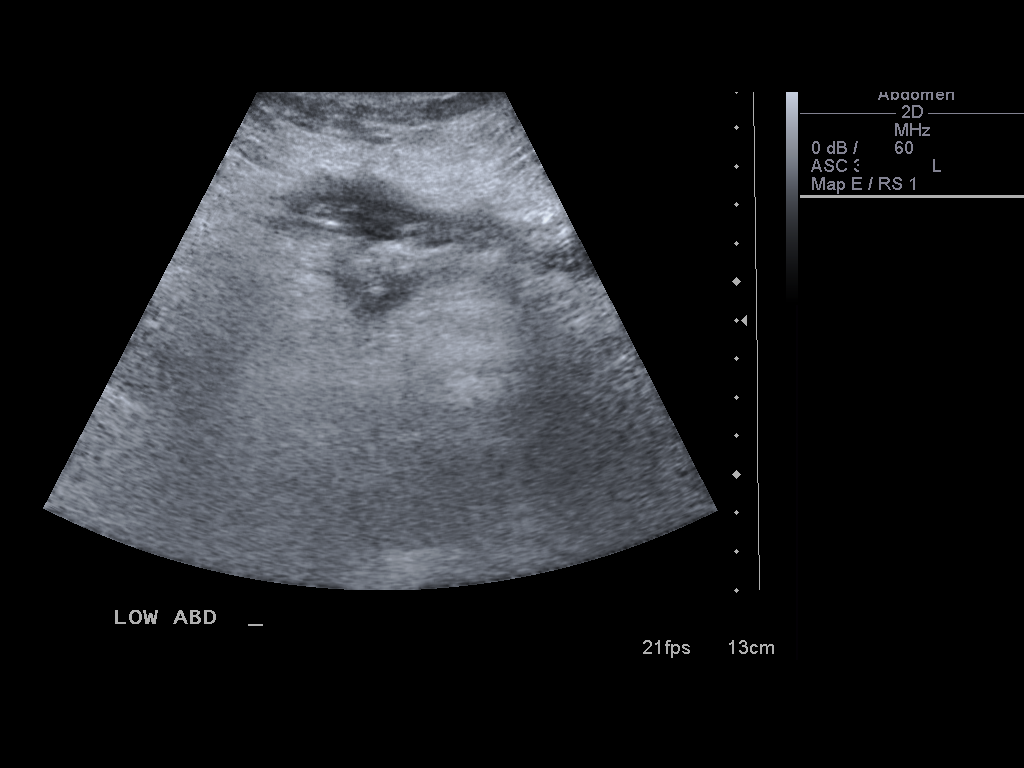
[im 2/3]
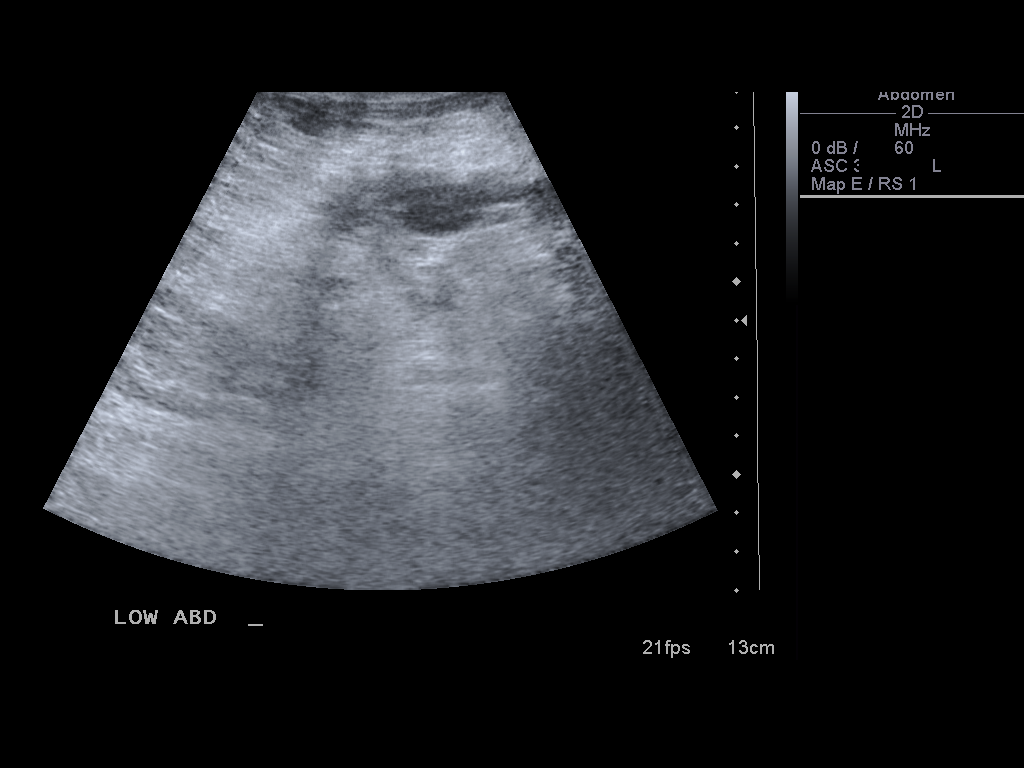
[im 3/3]
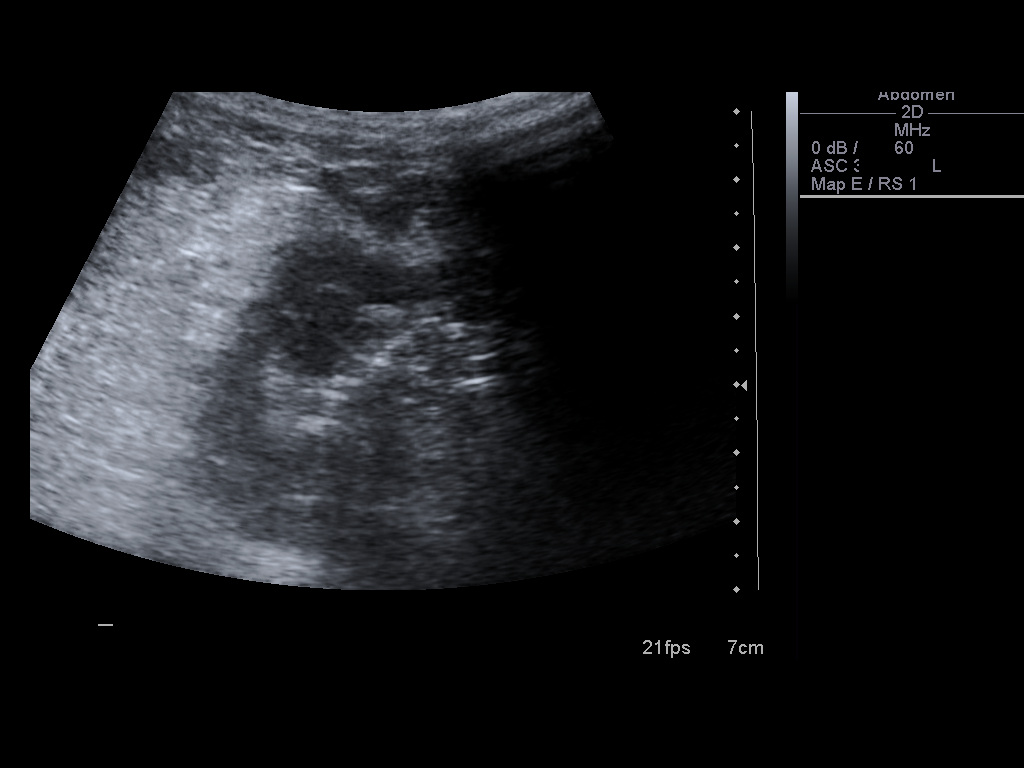

[3 of 3 positions shown; findings below may reference images not displayed]

PROCEDURE(S): ULTRASOUND GUIDED PLACEMENT OF A DRAIN WITHIN THE
VENTRAL SUBCUTANEOUS COLLECTION.

Medications:Versed 2 mg, Fentanyl 150 mcg.

Intravenous Fentanyl and Versed were administered as conscious
sedation during continuous cardiorespiratory monitoring by the
radiology RN, with a total moderate sedation time of 25 minutes.

Procedure:The procedure was explained to the patient.  The risks
and benefits of the procedure were discussed and the patient's
questions were addressed.  Informed consent was obtained from the
patient.  The anterior abdomen was evaluated with ultrasound.
Irregular hypoechoic collection in the ventral soft tissues was
identified.  The largest collection was inferior and medial to the
existing catheter.  The skin was prepped and draped in a sterile
fashion.  The skin was anesthetized with lidocaine.  5-French Yueh
catheter was directed into the hypoechoic collection with
ultrasound guidance.  A small amount of red serous fluid was
aspirated.  A stiff Amplatz wire was placed and a 12-French drain
was advanced over the Amplatz wire.  A few ml of bloody serous
fluid was removed.  No significant fluid could be obtained despite
catheter manipulation within the cavity.  Catheter was attached to
a suction bulb and sutured to the skin. Ultrasound images were
taken and saved for documentation.
FINDINGS: There is an irregular and complex hypoechoic collection in
the ventral subcutaneous tissues.  Catheter was advanced into the
hypoechoic collection but minimal fluid was removed.

Complications: None
IMPRESSION: Complex ventral subcutaneous abscess.  Placement of 12-
French catheter with minimal fluid drainage.

## 2011-11-14 ENCOUNTER — Encounter (HOSPITAL_COMMUNITY): Payer: Self-pay

## 2011-11-16 IMAGING — CT CT ABD-PELV W/O CM
2 of 4 series · 14 of 32 positions shown, 19 images · non-contrast
Comparison: 07/02/2010

CLINICAL DATA: Follow-up postop abscess.  2 months status post
ventral hernia repair.

CT ABDOMEN AND PELVIS WITHOUT CONTRAST
TECHNIQUE: Multidetector CT imaging of the abdomen and pelvis was
performed following the standard protocol without intravenous
contrast.

[Series 102: routine abdomen · axial · 0.98mm/px · z∈[-412,-72]mm · 6 of 96 slices shown, 11 images]
[im 14/96  soft-tissue]
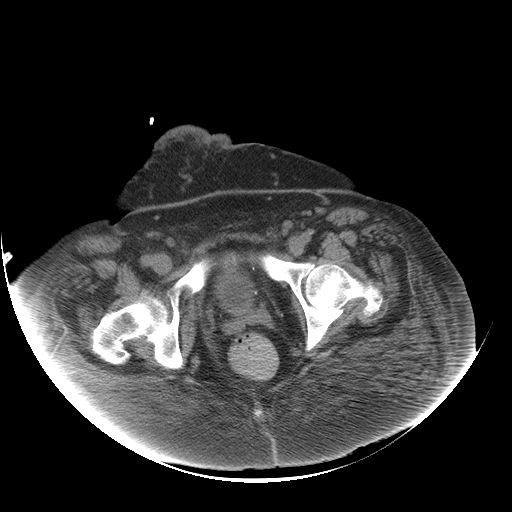
[im 14/96  bone]
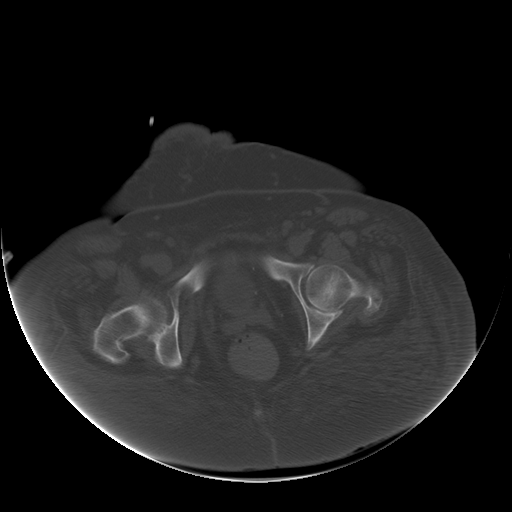
[im 28/96  soft-tissue]
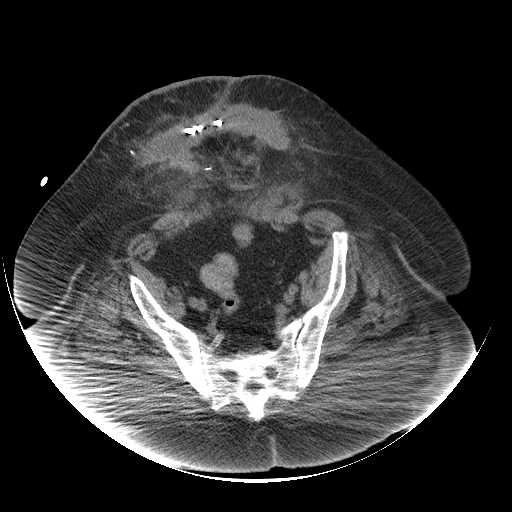
[im 41/96  soft-tissue]
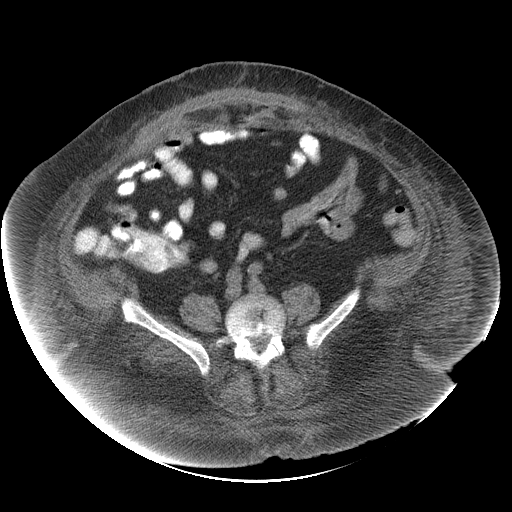
[im 41/96  lung]
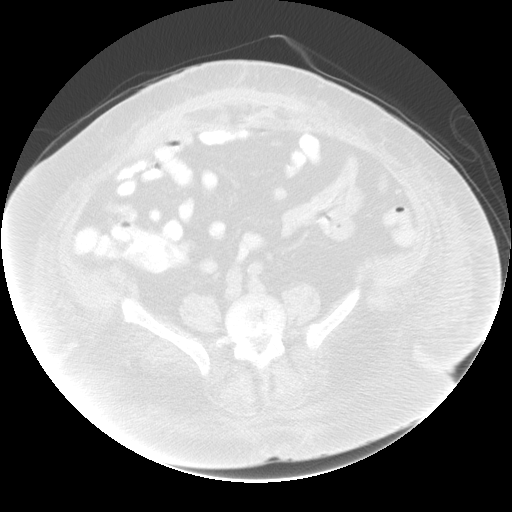
[im 55/96  soft-tissue]
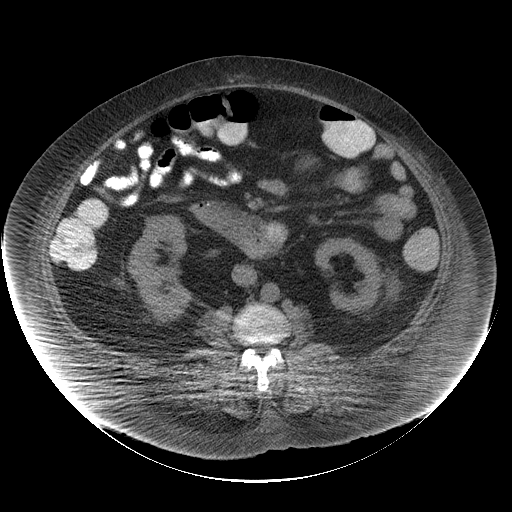
[im 55/96  lung]
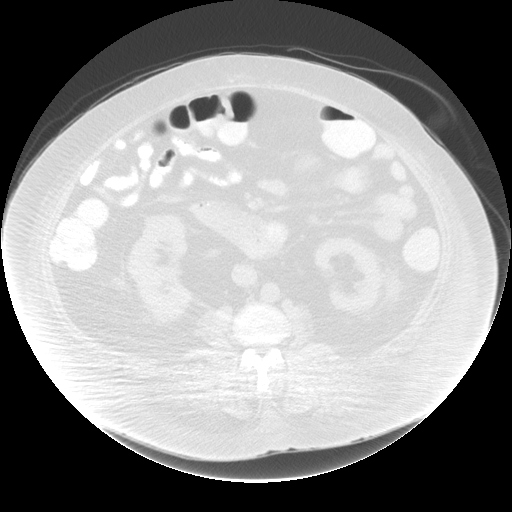
[im 68/96  soft-tissue]
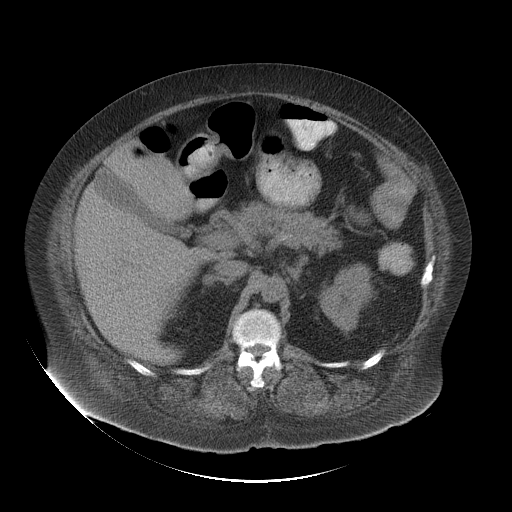
[im 68/96  lung]
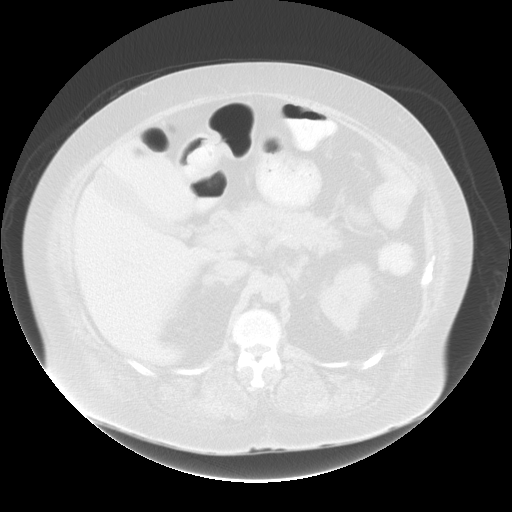
[im 82/96  soft-tissue]
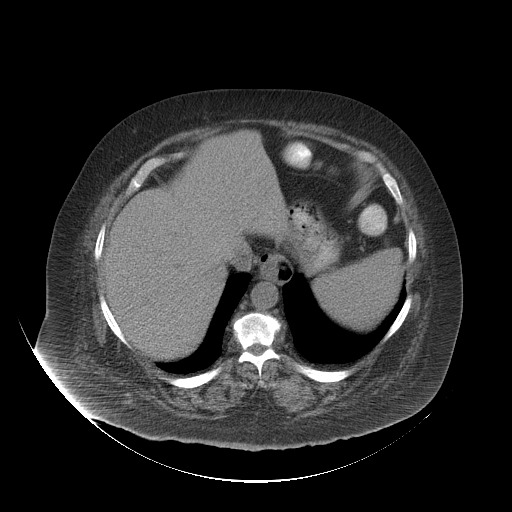
[im 82/96  lung]
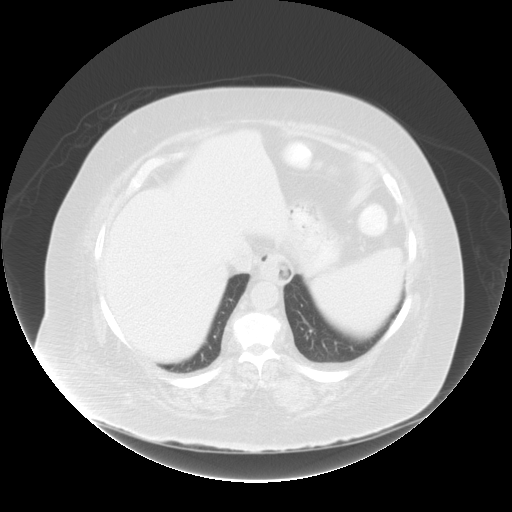

[Series 104: sagittal · sagittal · 0.98mm/px · 8 of 154 slices shown]
[im 14/154  soft-tissue]
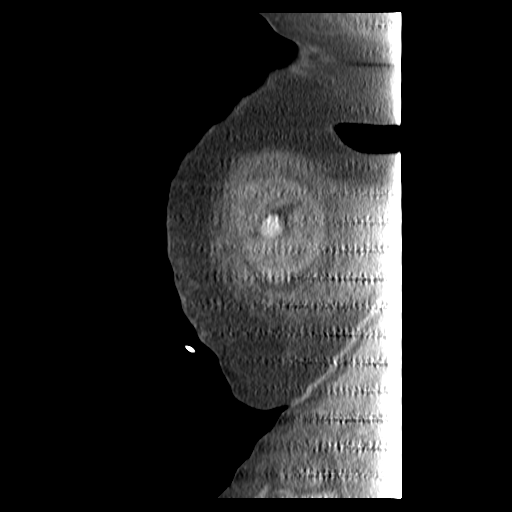
[im 28/154  soft-tissue]
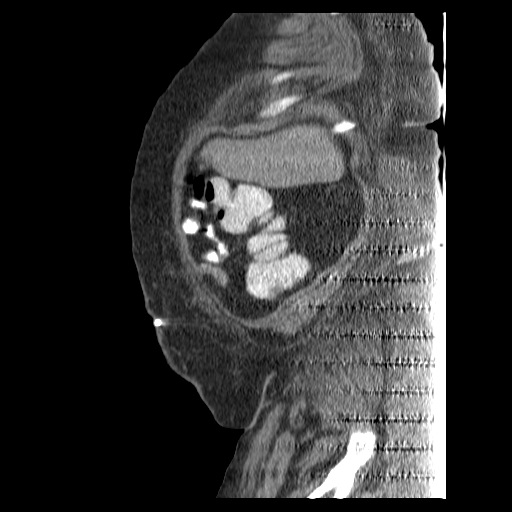
[im 56/154  soft-tissue]
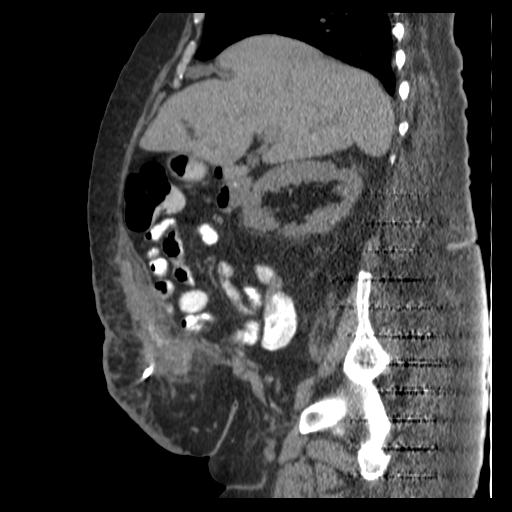
[im 70/154  soft-tissue]
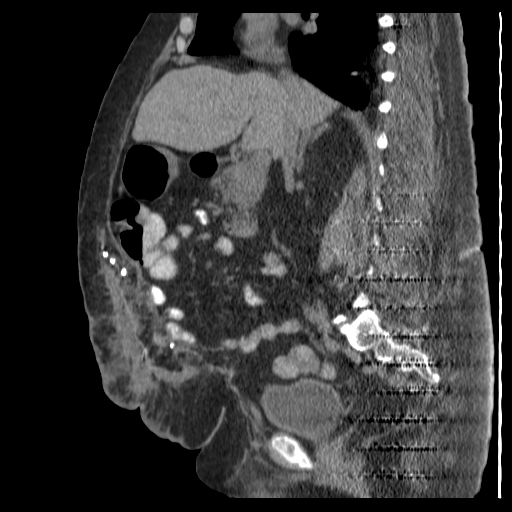
[im 84/154  soft-tissue]
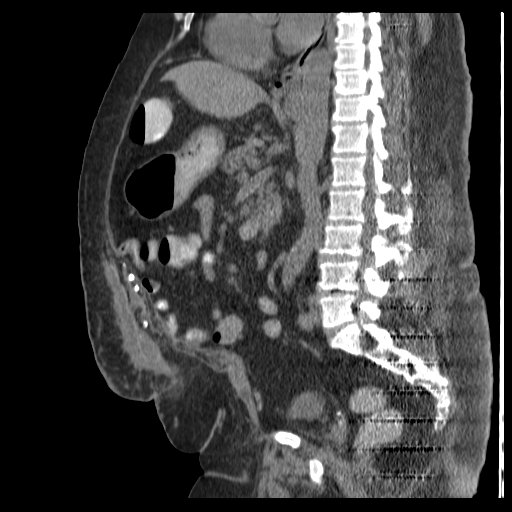
[im 98/154  soft-tissue]
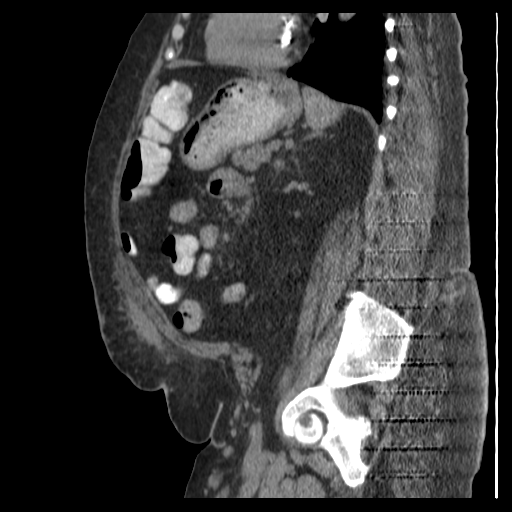
[im 126/154  soft-tissue]
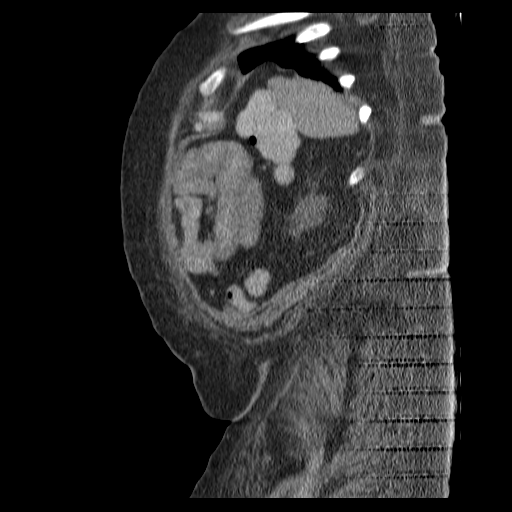
[im 140/154  soft-tissue]
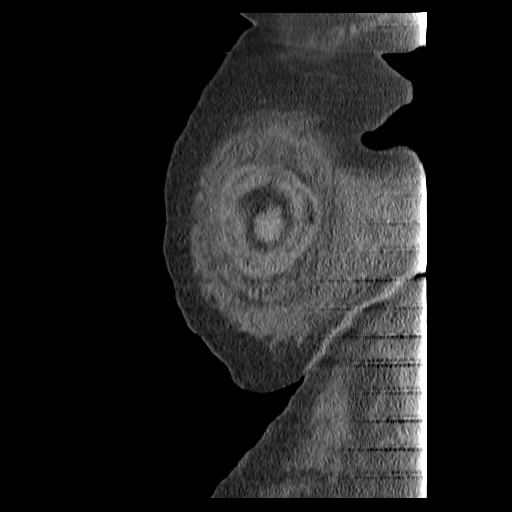

[14 of 32 positions shown; findings below may reference images not displayed]

FINDINGS: Postop changes are again seen from previous ventral
hernia repair.  To percutaneous drainage catheters are seen in
appropriate position, and there has been near complete resolution
of the fluid collection within the subcutaneous tissues of the
ventral abdominal wall. No definite recurrent hernia visualized.
No evidence of intraperitoneal fluid collections.

There is no evidence of dilated bowel loops or bowel wall
thickening.  Previous hysterectomy noted.  No soft tissue masses
are seen within the abdomen or pelvis.  The abdominal parenchymal
organs are normal appearance on this noncontrast study.
Gallbladder is unremarkable.  A small hiatal hernia is again noted.
IMPRESSION: 1.  Near complete resolution of the fluid collection in the ventral
abdominal wall subcutaneous tissues.  Two percutaneous drainage
catheters remain in appropriate position.
2.  Stable small hiatal hernia.

## 2011-11-18 ENCOUNTER — Other Ambulatory Visit (HOSPITAL_COMMUNITY): Payer: Self-pay | Admitting: *Deleted

## 2011-11-19 ENCOUNTER — Encounter (HOSPITAL_COMMUNITY)
Admission: RE | Admit: 2011-11-19 | Discharge: 2011-11-19 | Disposition: A | Discharge status: Home / Self Care | Payer: Medicare HMO | Source: Ambulatory Visit | Attending: Nephrology | Admitting: Nephrology

## 2011-11-19 LAB — POCT HEMOGLOBIN-HEMACUE: Hemoglobin: 8.4 g/dL — ABNORMAL LOW (ref 12.0–15.0)

## 2011-11-19 MED ORDER — EPOETIN ALFA 10000 UNIT/ML IJ SOLN
INTRAMUSCULAR | Status: AC
Start: 2011-11-19 — End: 2011-11-19
  Filled 2011-11-19: qty 1

## 2011-11-19 MED ORDER — EPOETIN ALFA 10000 UNIT/ML IJ SOLN
30000.0000 [IU] | INTRAMUSCULAR | Status: DC
  Administered 2011-11-19: 10000 [IU] via SUBCUTANEOUS

## 2011-11-19 MED ORDER — EPOETIN ALFA 20000 UNIT/ML IJ SOLN
INTRAMUSCULAR | Status: AC
  Administered 2011-11-19: 20000 [IU]
  Filled 2011-11-19: qty 1

## 2011-11-20 ENCOUNTER — Telehealth: Payer: Self-pay | Admitting: *Deleted

## 2011-11-20 NOTE — Telephone Encounter (Signed)
Patient called c/o pain in left arm s/p left basilic vein transposition AVF 11/10/11. No fever, bleeding or warmth.  States that site is throbbing. Patient is elevating arm on pillows. Scheduled patient  appt for 11/21/11 with NP. Patient understands that if worsening symptoms occur tonight go to ED.

## 2011-11-21 ENCOUNTER — Ambulatory Visit (INDEPENDENT_AMBULATORY_CARE_PROVIDER_SITE_OTHER): Payer: Medicare HMO | Admitting: Neurosurgery

## 2011-11-21 ENCOUNTER — Encounter: Payer: Self-pay | Admitting: Neurosurgery

## 2011-11-21 VITALS — BP 124/77 | HR 66 | Temp 98.5°F | Resp 16 | Ht 65.0 in | Wt 286.0 lb

## 2011-11-21 DIAGNOSIS — N186 End stage renal disease: Secondary | ICD-10-CM

## 2011-11-21 DIAGNOSIS — M7989 Other specified soft tissue disorders: Secondary | ICD-10-CM | POA: Insufficient documentation

## 2011-11-21 DIAGNOSIS — L299 Pruritus, unspecified: Secondary | ICD-10-CM

## 2011-11-21 DIAGNOSIS — M79609 Pain in unspecified limb: Secondary | ICD-10-CM

## 2011-11-21 MED ORDER — HYDROCODONE-ACETAMINOPHEN 5-500 MG PO TABS: 1.0000 | ORAL_TABLET | Freq: Four times a day (QID) | ORAL | Status: DC | PRN

## 2011-11-21 NOTE — Progress Notes (Signed)
Subjective:     Patient ID: [name], female   DOB: [birth year], 66 y.o.   MRN: [id number]  HPI: Female patient last seen by Dr. Bridgett Larsson 11/10/2011 for left first stage BVT, open cannulation brachial artery, left subclavian angiogram. The patient called the office requesting to be seen due to pain and swelling in her left upper extremity and was brought in for evaluation.   Review of Systems: 12 point review of systems is notable for the difficulties described above otherwise unremarkable     Objective:   Physical Exam: Afebrile, vital signs are stable, mild edema left upper extremity which is expected postoperatively, wound is healing well there is no redness or drainage, and Dermabond is still in place.     Assessment:     11 day status post above procedure. Dr. Bridgett Larsson examined the patient and explained to her that she needs to give this more time and if pain and swelling continues he may have to ligate the fistula.    Plan:     The patient will followup with Dr. Bridgett Larsson as scheduled in the next 2-3 weeks, her questions were encouraged and answered she understands to keep her arm elevated, we did give her prescription for Vicodin one every 6 hours 20 with no refill.  Beatris Ship ANP  Clinic M.D.: Bridgett Larsson

## 2011-12-03 ENCOUNTER — Encounter (HOSPITAL_COMMUNITY)
Admission: RE | Admit: 2011-12-03 | Discharge: 2011-12-03 | Disposition: A | Discharge status: Home / Self Care | Payer: Medicare HMO | Source: Ambulatory Visit | Attending: Nephrology | Admitting: Nephrology

## 2011-12-03 DIAGNOSIS — D638 Anemia in other chronic diseases classified elsewhere: Secondary | ICD-10-CM | POA: Insufficient documentation

## 2011-12-03 DIAGNOSIS — N183 Chronic kidney disease, stage 3 unspecified: Secondary | ICD-10-CM | POA: Insufficient documentation

## 2011-12-03 LAB — POCT HEMOGLOBIN-HEMACUE: Hemoglobin: 8.7 g/dL — ABNORMAL LOW (ref 12.0–15.0)

## 2011-12-03 MED ORDER — EPOETIN ALFA 10000 UNIT/ML IJ SOLN
30000.0000 [IU] | INTRAMUSCULAR | Status: DC
  Administered 2011-12-03: 10000 [IU] via SUBCUTANEOUS

## 2011-12-03 MED ORDER — EPOETIN ALFA 10000 UNIT/ML IJ SOLN
INTRAMUSCULAR | Status: AC
  Administered 2011-12-03: 10000 [IU] via SUBCUTANEOUS
  Filled 2011-12-03: qty 1

## 2011-12-03 MED ORDER — EPOETIN ALFA 20000 UNIT/ML IJ SOLN
INTRAMUSCULAR | Status: AC
  Administered 2011-12-03: 20000 [IU]
  Filled 2011-12-03: qty 1

## 2011-12-11 ENCOUNTER — Encounter: Payer: Self-pay | Admitting: Vascular Surgery

## 2011-12-12 ENCOUNTER — Ambulatory Visit (INDEPENDENT_AMBULATORY_CARE_PROVIDER_SITE_OTHER): Payer: Medicare HMO | Admitting: Vascular Surgery

## 2011-12-12 ENCOUNTER — Ambulatory Visit (INDEPENDENT_AMBULATORY_CARE_PROVIDER_SITE_OTHER): Payer: Medicare HMO | Admitting: *Deleted

## 2011-12-12 ENCOUNTER — Encounter: Payer: Self-pay | Admitting: Vascular Surgery

## 2011-12-12 VITALS — BP 133/76 | HR 70 | Ht 65.0 in | Wt 280.9 lb

## 2011-12-12 DIAGNOSIS — M79609 Pain in unspecified limb: Secondary | ICD-10-CM

## 2011-12-12 DIAGNOSIS — N186 End stage renal disease: Secondary | ICD-10-CM

## 2011-12-12 DIAGNOSIS — Z4931 Encounter for adequacy testing for hemodialysis: Secondary | ICD-10-CM

## 2011-12-12 NOTE — Progress Notes (Signed)
[facility] [place]   66 y.o. Female who underwent 1st stage BVT by Dr. Bridgett Larsson 11-10-2011.  She was seen in the office on 11-21-2011 due to left hand and forearm pain.  She was given a prescription for pain medication.  She is here today for a follow up visit.    Objective 133/76 70     100% PE:  Palpable thrill left cubital fossa with well healed scar. Sensation is intact and equal bilaterally upper extremities. Pain at rest and with motion of the hand and writ. Grip strength 4+/5 left compared to 5/5 right   Steal study performed in the office today: Right arteries are triphasic, left brachial is monophasic and radial/ulnar are triphasic. With manual compression of the AV fistula the digit pressure went from 155mm/MG to 132mm/MG    Assessment/Planning:  The augmentation of the steal study is not consistent with steal syndrome. The patient is not currently on dialysis and will continue monitoring her pain. She will F/U in 4 weeks for a duplex scan to check for competing branches.  [facility] 12/12/2011 2:45 PM --   Addendum  I have independently interviewed and examined the patient, and I agree with the physician assistant's findings.  Pt will return to office 4 weeks for formal access duplex to see if BVT is ready for transposition.  Hopefully her sx will resolve by then.  The degree of flow augmentation (33 mm Hg) is not strictly consistent with steal, but she has some sx concerning such.  Immediate ligation of the AVF is not necessary at this point.  Adele Barthel, MD [facility] [place] Office: [phone] Pager: [phone]  12/12/2011, 5:08 PM

## 2011-12-15 NOTE — Addendum Note (Signed)
Addended by: Mena Goes on: 12/15/2011 08:05 AM   Modules accepted: Orders

## 2011-12-16 ENCOUNTER — Other Ambulatory Visit (HOSPITAL_COMMUNITY): Payer: Self-pay | Admitting: *Deleted

## 2011-12-17 ENCOUNTER — Encounter (HOSPITAL_COMMUNITY)
Admission: RE | Admit: 2011-12-17 | Discharge: 2011-12-17 | Disposition: A | Discharge status: Home / Self Care | Payer: Medicare HMO | Source: Ambulatory Visit | Attending: Nephrology | Admitting: Nephrology

## 2011-12-17 LAB — IRON AND TIBC
Iron: 56 ug/dL (ref 42–135)
Saturation Ratios: 29 % (ref 20–55)
TIBC: 191 ug/dL — ABNORMAL LOW (ref 250–470)

## 2011-12-17 MED ORDER — EPOETIN ALFA 20000 UNIT/ML IJ SOLN
INTRAMUSCULAR | Status: AC
  Administered 2011-12-17: 20000 [IU]
  Filled 2011-12-17: qty 1

## 2011-12-17 MED ORDER — EPOETIN ALFA 10000 UNIT/ML IJ SOLN: 30000.0000 [IU] | INTRAMUSCULAR | Status: DC

## 2011-12-17 MED ORDER — EPOETIN ALFA 10000 UNIT/ML IJ SOLN
INTRAMUSCULAR | Status: AC
  Administered 2011-12-17: 10000 [IU]
  Filled 2011-12-17: qty 1

## 2011-12-31 ENCOUNTER — Encounter (HOSPITAL_COMMUNITY)
Admission: RE | Admit: 2011-12-31 | Discharge: 2011-12-31 | Disposition: A | Discharge status: Home / Self Care | Payer: Medicare HMO | Source: Ambulatory Visit | Attending: Nephrology | Admitting: Nephrology

## 2011-12-31 DIAGNOSIS — N183 Chronic kidney disease, stage 3 unspecified: Secondary | ICD-10-CM | POA: Insufficient documentation

## 2011-12-31 DIAGNOSIS — D638 Anemia in other chronic diseases classified elsewhere: Secondary | ICD-10-CM | POA: Insufficient documentation

## 2011-12-31 LAB — COMPREHENSIVE METABOLIC PANEL
AST: 9 U/L (ref 0–37)
Albumin: 2.7 g/dL — ABNORMAL LOW (ref 3.5–5.2)
Alkaline Phosphatase: 35 U/L — ABNORMAL LOW (ref 39–117)
Chloride: 107 mEq/L (ref 96–112)
Creatinine, Ser: 8 mg/dL — ABNORMAL HIGH (ref 0.50–1.10)
Potassium: 3.7 mEq/L (ref 3.5–5.1)
Sodium: 142 mEq/L (ref 135–145)
Total Bilirubin: 0.1 mg/dL — ABNORMAL LOW (ref 0.3–1.2)
Total Protein: 6.8 g/dL (ref 6.0–8.3)

## 2011-12-31 MED ORDER — EPOETIN ALFA 20000 UNIT/ML IJ SOLN
INTRAMUSCULAR | Status: AC
  Administered 2011-12-31: 20000 [IU] via SUBCUTANEOUS
  Filled 2011-12-31: qty 1

## 2011-12-31 MED ORDER — EPOETIN ALFA 10000 UNIT/ML IJ SOLN: 30000.0000 [IU] | INTRAMUSCULAR | Status: DC

## 2011-12-31 MED ORDER — EPOETIN ALFA 10000 UNIT/ML IJ SOLN
INTRAMUSCULAR | Status: AC
  Administered 2011-12-31: 10000 [IU] via SUBCUTANEOUS
  Filled 2011-12-31: qty 1

## 2012-01-02 ENCOUNTER — Encounter (HOSPITAL_COMMUNITY): Payer: Self-pay

## 2012-01-02 ENCOUNTER — Other Ambulatory Visit: Payer: Self-pay | Admitting: *Deleted

## 2012-01-04 MED ORDER — DEXTROSE 5 % IV SOLN
1.5000 g | INTRAVENOUS | Status: AC
  Administered 2012-01-05: 1.5 g via INTRAVENOUS
  Filled 2012-01-04: qty 1.5

## 2012-01-04 MED ORDER — SODIUM CHLORIDE 0.9 % IV SOLN: INTRAVENOUS | Status: DC

## 2012-01-05 ENCOUNTER — Encounter (HOSPITAL_COMMUNITY): Payer: Self-pay | Admitting: Critical Care Medicine

## 2012-01-05 ENCOUNTER — Ambulatory Visit (HOSPITAL_COMMUNITY)
Admission: RE | Admit: 2012-01-05 | Discharge: 2012-01-05 | Disposition: A | Discharge status: Home / Self Care | Payer: Medicare HMO | Source: Ambulatory Visit | Attending: Vascular Surgery | Admitting: Vascular Surgery

## 2012-01-05 ENCOUNTER — Ambulatory Visit (HOSPITAL_COMMUNITY): Payer: Medicare HMO

## 2012-01-05 ENCOUNTER — Encounter (HOSPITAL_COMMUNITY)
Admission: RE | Disposition: A | Discharge status: Home / Self Care | Payer: Self-pay | Source: Ambulatory Visit | Attending: Vascular Surgery

## 2012-01-05 ENCOUNTER — Ambulatory Visit (HOSPITAL_COMMUNITY): Payer: Medicare HMO | Admitting: Critical Care Medicine

## 2012-01-05 DIAGNOSIS — Z8249 Family history of ischemic heart disease and other diseases of the circulatory system: Secondary | ICD-10-CM | POA: Insufficient documentation

## 2012-01-05 DIAGNOSIS — Z9849 Cataract extraction status, unspecified eye: Secondary | ICD-10-CM | POA: Insufficient documentation

## 2012-01-05 DIAGNOSIS — I120 Hypertensive chronic kidney disease with stage 5 chronic kidney disease or end stage renal disease: Secondary | ICD-10-CM | POA: Insufficient documentation

## 2012-01-05 DIAGNOSIS — I252 Old myocardial infarction: Secondary | ICD-10-CM | POA: Insufficient documentation

## 2012-01-05 DIAGNOSIS — Z90710 Acquired absence of both cervix and uterus: Secondary | ICD-10-CM | POA: Insufficient documentation

## 2012-01-05 DIAGNOSIS — T82898A Other specified complication of vascular prosthetic devices, implants and grafts, initial encounter: Secondary | ICD-10-CM

## 2012-01-05 DIAGNOSIS — I2510 Atherosclerotic heart disease of native coronary artery without angina pectoris: Secondary | ICD-10-CM | POA: Insufficient documentation

## 2012-01-05 DIAGNOSIS — G4730 Sleep apnea, unspecified: Secondary | ICD-10-CM | POA: Insufficient documentation

## 2012-01-05 DIAGNOSIS — I509 Heart failure, unspecified: Secondary | ICD-10-CM | POA: Insufficient documentation

## 2012-01-05 DIAGNOSIS — D649 Anemia, unspecified: Secondary | ICD-10-CM | POA: Insufficient documentation

## 2012-01-05 DIAGNOSIS — E1142 Type 2 diabetes mellitus with diabetic polyneuropathy: Secondary | ICD-10-CM | POA: Insufficient documentation

## 2012-01-05 DIAGNOSIS — Z808 Family history of malignant neoplasm of other organs or systems: Secondary | ICD-10-CM | POA: Insufficient documentation

## 2012-01-05 DIAGNOSIS — M129 Arthropathy, unspecified: Secondary | ICD-10-CM | POA: Insufficient documentation

## 2012-01-05 DIAGNOSIS — Z87891 Personal history of nicotine dependence: Secondary | ICD-10-CM | POA: Insufficient documentation

## 2012-01-05 DIAGNOSIS — Z794 Long term (current) use of insulin: Secondary | ICD-10-CM | POA: Insufficient documentation

## 2012-01-05 DIAGNOSIS — N186 End stage renal disease: Secondary | ICD-10-CM

## 2012-01-05 DIAGNOSIS — E1149 Type 2 diabetes mellitus with other diabetic neurological complication: Secondary | ICD-10-CM | POA: Insufficient documentation

## 2012-01-05 HISTORY — DX: Myoneural disorder, unspecified: G70.9

## 2012-01-05 HISTORY — PX: INSERTION OF DIALYSIS CATHETER: SHX1324

## 2012-01-05 LAB — POCT I-STAT 4, (NA,K, GLUC, HGB,HCT)
Glucose, Bld: 133 mg/dL — ABNORMAL HIGH (ref 70–99)
HCT: 25 % — ABNORMAL LOW (ref 36.0–46.0)
Potassium: 3.7 mEq/L (ref 3.5–5.1)
Sodium: 146 mEq/L — ABNORMAL HIGH (ref 135–145)

## 2012-01-05 LAB — PROTIME-INR: INR: 1.09 (ref 0.00–1.49)

## 2012-01-05 LAB — SURGICAL PCR SCREEN: MRSA, PCR: NEGATIVE

## 2012-01-05 SURGERY — INSERTION OF DIALYSIS CATHETER
Anesthesia: Monitor Anesthesia Care | Site: Chest | Wound class: Clean

## 2012-01-05 MED ORDER — KETOROLAC TROMETHAMINE 30 MG/ML IJ SOLN
INTRAMUSCULAR | Status: AC
  Filled 2012-01-05: qty 1

## 2012-01-05 MED ORDER — SODIUM CHLORIDE 0.9 % IV SOLN
INTRAVENOUS | Status: DC | PRN
  Administered 2012-01-05: 08:00:00 via INTRAVENOUS

## 2012-01-05 MED ORDER — SODIUM CHLORIDE 0.9 % IR SOLN
Status: DC | PRN
  Administered 2012-01-05: 1000 mL

## 2012-01-05 MED ORDER — PROPOFOL 10 MG/ML IV BOLUS
INTRAVENOUS | Status: DC | PRN
  Administered 2012-01-05: 150 mg via INTRAVENOUS
  Administered 2012-01-05: 20 mg via INTRAVENOUS

## 2012-01-05 MED ORDER — KETOROLAC TROMETHAMINE 30 MG/ML IJ SOLN
30.0000 mg | Freq: Once | INTRAMUSCULAR | Status: AC
  Administered 2012-01-05: 30 mg via INTRAVENOUS

## 2012-01-05 MED ORDER — HEPARIN SODIUM (PORCINE) 1000 UNIT/ML IJ SOLN
INTRAMUSCULAR | Status: AC
  Filled 2012-01-05: qty 1

## 2012-01-05 MED ORDER — SODIUM CHLORIDE 0.9 % IR SOLN
Status: DC | PRN
  Administered 2012-01-05: 08:00:00

## 2012-01-05 MED ORDER — ACETAMINOPHEN 325 MG PO TABS
ORAL_TABLET | ORAL | Status: AC
  Filled 2012-01-05: qty 2

## 2012-01-05 MED ORDER — LIDOCAINE-EPINEPHRINE (PF) 1 %-1:200000 IJ SOLN
INTRAMUSCULAR | Status: AC
  Filled 2012-01-05: qty 10

## 2012-01-05 MED ORDER — LIDOCAINE HCL (PF) 0.5 % IJ SOLN
INTRAMUSCULAR | Status: AC
  Filled 2012-01-05: qty 50

## 2012-01-05 MED ORDER — ONDANSETRON HCL 4 MG/2ML IJ SOLN
INTRAMUSCULAR | Status: DC | PRN
  Administered 2012-01-05: 4 mg via INTRAVENOUS

## 2012-01-05 MED ORDER — HYDROMORPHONE HCL PF 1 MG/ML IJ SOLN
0.2500 mg | INTRAMUSCULAR | Status: DC | PRN
  Administered 2012-01-05: 0.5 mg via INTRAVENOUS
  Administered 2012-01-05: 0.25 mg via INTRAVENOUS

## 2012-01-05 MED ORDER — MUPIROCIN 2 % EX OINT
TOPICAL_OINTMENT | CUTANEOUS | Status: AC
  Administered 2012-01-05: 1 via NASAL
  Filled 2012-01-05: qty 22

## 2012-01-05 MED ORDER — EPHEDRINE SULFATE 50 MG/ML IJ SOLN
INTRAMUSCULAR | Status: DC | PRN
  Administered 2012-01-05: 10 mg via INTRAVENOUS
  Administered 2012-01-05: 5 mg via INTRAVENOUS

## 2012-01-05 MED ORDER — ONDANSETRON HCL 4 MG/2ML IJ SOLN
INTRAMUSCULAR | Status: AC
  Filled 2012-01-05: qty 2

## 2012-01-05 MED ORDER — LIDOCAINE HCL (CARDIAC) 20 MG/ML IV SOLN
INTRAVENOUS | Status: DC | PRN
  Administered 2012-01-05: 40 mg via INTRAVENOUS

## 2012-01-05 MED ORDER — HEPARIN SODIUM (PORCINE) 1000 UNIT/ML IJ SOLN
INTRAMUSCULAR | Status: DC | PRN
  Administered 2012-01-05: 1000 [IU]

## 2012-01-05 MED ORDER — ACETAMINOPHEN 10 MG/ML IV SOLN: 1000.0000 mg | Freq: Once | INTRAVENOUS | Status: DC | PRN

## 2012-01-05 MED ORDER — MIDAZOLAM HCL 5 MG/5ML IJ SOLN
INTRAMUSCULAR | Status: DC | PRN
  Administered 2012-01-05: 2 mg via INTRAVENOUS

## 2012-01-05 MED ORDER — ACETAMINOPHEN 325 MG PO TABS
650.0000 mg | ORAL_TABLET | Freq: Once | ORAL | Status: AC
  Administered 2012-01-05: 650 mg via ORAL

## 2012-01-05 MED ORDER — ONDANSETRON HCL 4 MG/2ML IJ SOLN
4.0000 mg | Freq: Once | INTRAMUSCULAR | Status: AC | PRN
  Administered 2012-01-05: 4 mg via INTRAVENOUS

## 2012-01-05 MED ORDER — FENTANYL CITRATE 0.05 MG/ML IJ SOLN
INTRAMUSCULAR | Status: DC | PRN
  Administered 2012-01-05: 50 ug via INTRAVENOUS

## 2012-01-05 MED ORDER — LIDOCAINE-EPINEPHRINE (PF) 1 %-1:200000 IJ SOLN
INTRAMUSCULAR | Status: DC | PRN
  Administered 2012-01-05: 30 mL

## 2012-01-05 MED ORDER — HYDROMORPHONE HCL PF 1 MG/ML IJ SOLN
INTRAMUSCULAR | Status: AC
  Filled 2012-01-05: qty 1

## 2012-01-05 SURGICAL SUPPLY — 44 items
BAG DECANTER FOR FLEXI CONT (MISCELLANEOUS) ×2 IMPLANT
CATH CANNON HEMO 15F 50CM (CATHETERS) IMPLANT
CATH CANNON HEMO 15FR 19 (HEMODIALYSIS SUPPLIES) IMPLANT
CATH CANNON HEMO 15FR 23CM (HEMODIALYSIS SUPPLIES) ×2 IMPLANT
CATH CANNON HEMO 15FR 31CM (HEMODIALYSIS SUPPLIES) IMPLANT
CATH CANNON HEMO 15FR 32CM (HEMODIALYSIS SUPPLIES) IMPLANT
CLOTH BEACON ORANGE TIMEOUT ST (SAFETY) ×2 IMPLANT
COVER PROBE W GEL 5X96 (DRAPES) ×2 IMPLANT
COVER SURGICAL LIGHT HANDLE (MISCELLANEOUS) ×2 IMPLANT
DERMABOND ADVANCED (GAUZE/BANDAGES/DRESSINGS) ×1
DERMABOND ADVANCED .7 DNX12 (GAUZE/BANDAGES/DRESSINGS) ×1 IMPLANT
DRAPE C-ARM 42X72 X-RAY (DRAPES) ×2 IMPLANT
DRAPE CHEST BREAST 15X10 FENES (DRAPES) ×2 IMPLANT
GAUZE SPONGE 2X2 8PLY STRL LF (GAUZE/BANDAGES/DRESSINGS) ×1 IMPLANT
GAUZE SPONGE 4X4 16PLY XRAY LF (GAUZE/BANDAGES/DRESSINGS) ×2 IMPLANT
GLOVE BIO SURGEON STRL SZ 6.5 (GLOVE) ×2 IMPLANT
GLOVE BIO SURGEON STRL SZ7 (GLOVE) ×2 IMPLANT
GLOVE BIOGEL PI IND STRL 7.0 (GLOVE) ×2 IMPLANT
GLOVE BIOGEL PI IND STRL 7.5 (GLOVE) ×1 IMPLANT
GLOVE BIOGEL PI INDICATOR 7.0 (GLOVE) ×2
GLOVE BIOGEL PI INDICATOR 7.5 (GLOVE) ×1
GOWN STRL NON-REIN LRG LVL3 (GOWN DISPOSABLE) ×4 IMPLANT
KIT BASIN OR (CUSTOM PROCEDURE TRAY) ×2 IMPLANT
KIT ROOM TURNOVER OR (KITS) ×2 IMPLANT
NEEDLE 18GX1X1/2 (RX/OR ONLY) (NEEDLE) ×2 IMPLANT
NEEDLE HYPO 25GX1X1/2 BEV (NEEDLE) ×2 IMPLANT
NS IRRIG 1000ML POUR BTL (IV SOLUTION) ×2 IMPLANT
PACK SURGICAL SETUP 50X90 (CUSTOM PROCEDURE TRAY) ×2 IMPLANT
PAD ARMBOARD 7.5X6 YLW CONV (MISCELLANEOUS) ×4 IMPLANT
SET MICROPUNCTURE 5F STIFF (MISCELLANEOUS) ×2 IMPLANT
SOAP 2 % CHG 4 OZ (WOUND CARE) ×2 IMPLANT
SPONGE GAUZE 2X2 STER 10/PKG (GAUZE/BANDAGES/DRESSINGS) ×1
SUT ETHILON 3 0 PS 1 (SUTURE) ×2 IMPLANT
SUT MNCRL AB 4-0 PS2 18 (SUTURE) ×2 IMPLANT
SYR 20CC LL (SYRINGE) ×4 IMPLANT
SYR 30ML LL (SYRINGE) IMPLANT
SYR 3ML LL SCALE MARK (SYRINGE) ×2 IMPLANT
SYR 5ML LL (SYRINGE) ×2 IMPLANT
SYR CONTROL 10ML LL (SYRINGE) ×2 IMPLANT
SYRINGE 10CC LL (SYRINGE) ×2 IMPLANT
TAPE CLOTH SURG 4X10 WHT LF (GAUZE/BANDAGES/DRESSINGS) ×2 IMPLANT
TOWEL OR 17X24 6PK STRL BLUE (TOWEL DISPOSABLE) ×2 IMPLANT
TOWEL OR 17X26 10 PK STRL BLUE (TOWEL DISPOSABLE) ×2 IMPLANT
WATER STERILE IRR 1000ML POUR (IV SOLUTION) ×2 IMPLANT

## 2012-01-05 NOTE — Progress Notes (Signed)
PATIENT STATED SHE DID NOT UNDERSTAND WHY THEY WERE GOING TO PLACE A CATHETER IF SHE HAS FISTULA .  PAGED DR Bridgett Larsson, PATIENT WILL NEED TO SIGN CONSENT ONCE SHE SPEAKS TO DR, CHEN.

## 2012-01-05 NOTE — Op Note (Signed)
OPERATIVE NOTE  PROCEDURE: 1. right internal jugular vein vein tunneled dialysis catheter placement 2. right internal jugular vein vein cannulation under ultrasound guidance  PRE-OPERATIVE DIAGNOSIS: end-stage renal failure  POST-OPERATIVE DIAGNOSIS: same as above  SURGEON: Yovanna Cogan LIANG-YU, MD  ANESTHESIA: local and MAC  ESTIMATED BLOOD LOSS: minimal  FINDING(S): 1.  Tips of the catheter in the right atrium on fluoroscopy 2.  No obvious pneumothorax on fluoroscopy  SPECIMEN(S):  none  INDICATIONS:   [name] is a 66 y.o. female who presents with end stage renal disease.  The patient presents for tunneled dialysis catheter placement.  The patient is aware the risks of tunneled dialysis catheter placement include but are not limited to: bleeding, infection, central venous injury, pneumothorax, possible venous stenosis, possible malpositioning in the venous system, and possible infections related to long-term catheter presence. The patient was aware of these risks and agreed to proceed.  DESCRIPTION: After written full informed consent was obtained from the patient, the patient was taken back to the operating room.  Prior to induction, the patient was given IV antibiotics.  After obtaining adequate sedation, the patient was prepped and draped in the standard fashion for a chest or neck tunneled dialysis catheter placement.  I anesthesized the neck cannulation site with local anesthetic.  Under ultrasound guidance, the right internal jugular vein vein was cannulated with the 18 gauge needle.  A J-wire was then placed down in the right ventricle under fluroscopic guidance.  The wire was then secured in place with a clamp to the drapes.  The cannulation site, the catheter exit site, and tract for the subcutaneous tunnel were then anesthesized with a total of 10 cc of a 1:1 mixture of 0.5% Marcaine without epinepherine and 1% Lidocaine with epinepherine.  I then made stab incisions at  the neck and exit sites.  I dissected from the chest to the neck and dilated the subcutaneous tunnel with a plastic dilator.  The wire was then unclamped and I removed the needle.  The skin tract and venotomy was dilated serially with dilators.  Finally, the dilator-sheath was placed under fluroscopic guidance into the superior vena cava.  The dilator and wire were removed.  A 23 cm Diatek catheter was placed under fluoroscopic guidance down into the right atrium.  The sheath was broken and peeled away while holding the catheter cuff at the level of the skin.  The back end of this catheter was transected, revealing the two lumens of this catheter.  The ports were docked onto these two lumens.  The catheter hub was then screwed into place.  Each port was tested by aspirating and flushing.  No resistance was noted.  Each port was then thoroughly flushed with heparinized saline.  The catheter was secured in placed with two interrupted stitches of 3-0 Nylon tied to the catheter.  The neck incision was closed with a U-stitch of 4-0 Monocryl.  The neck and chest incision were cleaned and sterile bandages applied.  Each port was then loaded with concentrated heparin (1000 Units/mL) at the manufacturer recommended volumes to each port.  Sterile caps were applied to each port.  On completion fluoroscopy, the tips of the catheter were in the right atrium, and there was no evidence of pneumothorax.  COMPLICATIONS: none  CONDITION: stable   Adele Barthel, MD [facility] [place] Office: [phone] Pager: [phone]  01/05/2012, 8:50 AM

## 2012-01-05 NOTE — H&P (Signed)
[facility] [place]  Brief History and Physical  History of Present Illness  [name] is a 66 y.o. female who presents with chief complaint: end stage renal disease.  The patient presents today for tunneled dialysis catheter placement.    Past Medical History  Diagnosis Date  . Arthritis   . Abdominal abscess 12-17-10    abdominal abscesses x2 ? one at this time  . Staph aureus infection November 2012   . Myocardial infarction 12-17-10    80's-abnormal testing showed  . Swelling of both ankles 12-17-10  . Blood transfusion, without reported diagnosis 12-17-10    "thinks in [facility]  . Chronic kidney disease   . Chronic renal insufficiency 2009    sees Dr. Florene [name]  every 4-5 mo  . Anemia   . Diabetes mellitus     type 2 IDDM x 6-7 years  . Coronary artery disease     Dr [name]-cardiologist 2-3 x year  . CHF (congestive heart failure)   . Diabetes mellitus   . Dysrhythmia     irregular, "skips beats"  . Shortness of breath   . Sleep apnea 2012    CPAP  . Hypertension     sees Dr. Willey [name]  . Neuromuscular disorder     diabetic neuropathy    Past Surgical History  Procedure Date  . Ventral hernia repair     component separation, repair with biologic  . Vaginal hysterectomy   . Cardiac catheterization 12-17-10    '80's  . Abdominal hysterectomy 12-17-10    Vaginal Hysterectomy  . Cataract extraction w/ intraocular lens implant 12-17-10    bilateral  . Laparotomy 12/18/2010    Procedure: EXPLORATORY LAPAROTOMY;  Surgeon: Tiffany Bookbinder, MD;  Location: [facility];  Service: General;  Laterality: N/A;  Abdominal Seroma Evacuation  . Back surgery   . Lumbar disc surgery 1970'-80's    X 3  . Eye surgery   . Wound debridement 03/20/2011    Procedure: DEBRIDEMENT ABDOMINAL WOUND;  Surgeon: Tiffany Bookbinder, MD;  Location: [facility];  Service: General;  Laterality: N/A;  debridement abdominal wall, placement of wound vac  .  Abdominal hysterectomy   . Cataract extraction, bilateral   . Av fistula placement 09/30/2011    Procedure: ARTERIOVENOUS (AV) FISTULA CREATION;  Surgeon: Tiffany [place], MD;  Location: [name];  Service: Vascular;  Laterality: Left;  BRACHIAL-CEPHALIC  . Bascilic vein transposition 11/10/2011    Left arm     History   Social History  . Marital Status: Widowed    Spouse Name: N/A    Number of Children: N/A  . Years of Education: N/A   Occupational History  . Not on file.   Social History Main Topics  . Smoking status: Former Smoker    Types: Cigarettes    Quit date: 12/16/1980  . Smokeless tobacco: Never Used  . Alcohol Use: No  . Drug Use: No  . Sexually Active: No   Other Topics Concern  . Not on file   Social History Narrative   ** Merged History Encounter **     Family History  Problem Relation Age of Onset  . Hypertension Mother   . Cancer Brother     spine  . Cancer Sister     brain  . Hypertension Maternal Grandmother   . Anesthesia problems Neg Hx   . Hypertension Father     No current facility-administered medications on file prior to encounter.  Current Outpatient Prescriptions on File Prior to Encounter  Medication Sig Dispense Refill  . amLODipine (NORVASC) 10 MG tablet Take 10 mg by mouth daily.       Marland Kitchen aspirin EC 81 MG tablet Take 81 mg by mouth daily.        Marland Kitchen BAYER BREEZE 2 TEST DISK       . bisacodyl (DULCOLAX) 5 MG EC tablet Take 5 mg by mouth daily as needed. For constipation      . Blood Glucose Calibration (BAYER BREEZE 2 CONTROL) NORMAL LIQD       . Blood Glucose Calibration (TAI DOC CONTROL) NORMAL SOLN       . Blood Glucose Monitoring Suppl (CLEVER CHEK AUTO-CODE VOICE) DEVI       . Blood Glucose Monitoring Suppl (FORA V12 BLOOD GLUCOSE SYSTEM) DEVI       . calcitRIOL (ROCALTROL) 0.25 MCG capsule       . Calcium Carbonate-Vitamin D (CALCIUM 600 + D PO) Take 1 tablet by mouth daily.       . carvedilol (COREG) 25 MG tablet Take 25 mg by  mouth 2 (two) times daily.      Marland Kitchen CLEVER CHEK AUTO-CODE VOICE test strip       . cloNIDine (CATAPRES) 0.3 MG tablet Take 0.3 mg by mouth 2 (two) times daily.      . ferrous sulfate 325 (65 FE) MG tablet Take 325 mg by mouth daily with breakfast.        . furosemide (LASIX) 80 MG tablet Take 80 mg by mouth 2 (two) times daily.       . hydrALAZINE (APRESOLINE) 50 MG tablet Take 50 mg by mouth 3 (three) times daily.       . insulin glargine (LANTUS) 100 UNIT/ML injection Inject 5 Units into the skin at bedtime. SSI      . insulin lispro (HUMALOG) 100 UNIT/ML injection Inject 5 Units into the skin 3 (three) times daily before meals.       . isosorbide mononitrate (IMDUR) 60 MG 24 hr tablet Take 60 mg by mouth daily.       Elmore Guise Devices (LANCING DEVICE) MISC       . Lite Touch Lancets MISC       . metoprolol succinate (TOPROL-XL) 50 MG 24 hr tablet 50 mg.       . Multiple Vitamin (MULITIVITAMIN WITH MINERALS) TABS Take 1 tablet by mouth daily.      Marland Kitchen NITROSTAT 0.4 MG SL tablet Take 0.4 mg by mouth every 5 (five) minutes as needed. For chest pain      . oxyCODONE (ROXICODONE) 5 MG immediate release tablet Take 1 tablet (5 mg total) by mouth every 4 (four) hours as needed for pain.  30 tablet  0  . Pharmacist Choice Lancets MISC       . rosuvastatin (CRESTOR) 20 MG tablet Take 20 mg by mouth daily.      Baird Cancer ophthalmic ointment Place 1 application into the right eye daily.         No Known Allergies  Review of Systems: As listed above, otherwise negative.  Physical Examination  Filed Vitals:   01/02/12 1731  Height: 5\' 5"  (1.651 m)  Weight: 231 lb (104.781 kg)    General: A&O x 3, WDWN  Pulmonary: Sym exp, good air movt, CTAB, no rales, rhonchi, & wheezing  Cardiac: RRR, Nl S1, S2, no Murmurs, rubs or gallops  Gastrointestinal: soft, NTND, -G/R, -  HSM, - masses, - CVAT B  Musculoskeletal: M/S 5/5 throughout , Extremities without ischemic changes   Laboratory See  [facility]  [name] is a 66 y.o. female who presents with: end stage renal disease .   The patient is scheduled for: tunneled dialysis catheter placement The patient is aware the risks of tunneled dialysis catheter placement include but are not limited to: bleeding, infection, central venous injury, pneumothorax, possible venous stenosis, possible malpositioning in the venous system, and possible infections related to long-term catheter presence.   The patient is aware of the risks and agrees to proceed.  Adele Barthel, MD [facility] [place] Office: [phone] Pager: [phone]  01/05/2012, 7:35 AM

## 2012-01-05 NOTE — Anesthesia Preprocedure Evaluation (Addendum)
Anesthesia Evaluation  Patient identified by MRN, date of birth, ID band Patient awake    Reviewed: Allergy & Precautions, H&P , NPO status , Patient's Chart, lab work & pertinent test results, reviewed documented beta blocker date and time   Airway Mallampati: I TM Distance: >3 FB Neck ROM: Full    Dental  (+) Dental Advisory Given, Edentulous Upper and Edentulous Lower   Pulmonary shortness of breath, sleep apnea ,  breath sounds clear to auscultation        Cardiovascular hypertension, Pt. on home beta blockers + CAD, + Past MI and +CHF Rhythm:Regular Rate:Normal     Neuro/Psych    GI/Hepatic   Endo/Other  diabetesMorbid obesity  Renal/GU DialysisRenal disease     Musculoskeletal   Abdominal (+) + obese,   Peds  Hematology  (+) Blood dyscrasia, anemia ,   Anesthesia Other Findings   Reproductive/Obstetrics                         Anesthesia Physical Anesthesia Plan  ASA: IV  Anesthesia Plan: MAC and General   Post-op Pain Management:    Induction: Intravenous  Airway Management Planned: Simple Face Mask  Additional Equipment:   Intra-op Plan:   Post-operative Plan: Extubation in OR  Informed Consent: I have reviewed the patients History and Physical, chart, labs and discussed the procedure including the risks, benefits and alternatives for the proposed anesthesia with the patient or authorized representative who has indicated his/her understanding and acceptance.   Dental advisory given  Plan Discussed with: Anesthesiologist, Surgeon and CRNA  Anesthesia Plan Comments: (ESRD has not started HD yet K-3.7 Obesity Type 2 DM glucose 133 Htn Sleep apnea on CPAP   Plan GA with LMA   Roberts Gaudy, MD)       Anesthesia Quick Evaluation

## 2012-01-05 NOTE — Anesthesia Procedure Notes (Signed)
Procedure Name: LMA Insertion Date/Time: 01/05/2012 8:20 AM Performed by: Carola Frost Pre-anesthesia Checklist: Patient identified, Timeout performed, Emergency Drugs available, Suction available and Patient being monitored Patient Re-evaluated:Patient Re-evaluated prior to inductionOxygen Delivery Method: Circle system utilized Intubation Type: IV induction Ventilation: Mask ventilation without difficulty LMA: LMA with gastric port inserted LMA Size: 5.0 Number of attempts: 1 Placement Confirmation: positive ETCO2 and breath sounds checked- equal and bilateral Tube secured with: Tape Dental Injury: Teeth and Oropharynx as per pre-operative assessment

## 2012-01-05 NOTE — Transfer of Care (Signed)
Immediate Anesthesia Transfer of Care Note  Patient: [name]  Procedure(s) Performed: Procedure(s) (LRB) with comments: INSERTION OF DIALYSIS CATHETER (N/A) - Right Internal Jugular Placement  Patient Location: PACU  Anesthesia Type:General  Level of Consciousness: awake and alert   Airway & Oxygen Therapy: Patient Spontanous Breathing and Patient connected to face mask oxygen  Post-op Assessment: Report given to PACU RN, Post -op Vital signs reviewed and stable and Patient moving all extremities X 4  Post vital signs: Reviewed and stable  Complications: none

## 2012-01-05 NOTE — Preoperative (Signed)
Beta Blockers   Reason not to administer Beta Blockers:Not Applicable, pt took [id number]

## 2012-01-05 NOTE — Anesthesia Postprocedure Evaluation (Signed)
  Anesthesia Post-op Note  Patient: [name]  Procedure(s) Performed: Procedure(s) (LRB) with comments: INSERTION OF DIALYSIS CATHETER (N/A) - Right Internal Jugular Placement  Patient Location: PACU  Anesthesia Type:General  Level of Consciousness: awake, alert  and oriented  Airway and Oxygen Therapy: Patient Spontanous Breathing  Post-op Pain: mild  Post-op Assessment: Post-op Vital signs reviewed, Patient's Cardiovascular Status Stable, Respiratory Function Stable, Patent Airway and No signs of Nausea or vomiting  Post-op Vital Signs: stable  Complications: No apparent anesthesia complications

## 2012-01-06 ENCOUNTER — Encounter (HOSPITAL_COMMUNITY): Payer: Self-pay | Admitting: Vascular Surgery

## 2012-01-13 ENCOUNTER — Encounter (HOSPITAL_COMMUNITY): Payer: Medicare HMO

## 2012-01-15 ENCOUNTER — Encounter: Payer: Self-pay | Admitting: Vascular Surgery

## 2012-01-16 ENCOUNTER — Ambulatory Visit (INDEPENDENT_AMBULATORY_CARE_PROVIDER_SITE_OTHER): Payer: Medicare HMO | Admitting: Vascular Surgery

## 2012-01-16 ENCOUNTER — Encounter (INDEPENDENT_AMBULATORY_CARE_PROVIDER_SITE_OTHER): Payer: Medicare HMO | Admitting: *Deleted

## 2012-01-16 ENCOUNTER — Encounter: Payer: Self-pay | Admitting: Vascular Surgery

## 2012-01-16 VITALS — BP 144/65 | HR 70 | Ht 65.0 in | Wt 271.9 lb

## 2012-01-16 DIAGNOSIS — N186 End stage renal disease: Secondary | ICD-10-CM

## 2012-01-16 DIAGNOSIS — Z4931 Encounter for adequacy testing for hemodialysis: Secondary | ICD-10-CM

## 2012-01-16 NOTE — Progress Notes (Signed)
[facility] [place]  Postoperative Access Visit  History of Present Illness  [name] is a 66 y.o. year old female who presents for postoperative follow-up for: L 1st BRVT (Date: 09/30/11).  The patient's wounds are healed.  The patient notes mild steal symptoms.  The patient is able to complete their activities of daily living.  The patient's current symptoms are: mild intermittent paraesthesia in hand.  Her steal sx are improved from previous and tolerable per the patient.   Past Medical History  Diagnosis Date  . Arthritis   . Abdominal abscess 12-17-10    abdominal abscesses x2 ? one at this time  . Staph aureus infection November 2012   . Myocardial infarction 12-17-10    80's-abnormal testing showed  . Swelling of both ankles 12-17-10  . Blood transfusion, without reported diagnosis 12-17-10    "thinks in [facility]  . Chronic kidney disease   . Chronic renal insufficiency 2009    sees Dr. Florene Glen  every 4-5 mo  . Anemia   . Diabetes mellitus     type 2 IDDM x 6-7 years  . Coronary artery disease     Dr Kadakia-cardiologist 2-3 x year  . CHF (congestive heart failure)   . Diabetes mellitus   . Dysrhythmia     irregular, "skips beats"  . Shortness of breath   . Sleep apnea 2012    CPAP  . Hypertension     sees Dr. Willey Blade  . Neuromuscular disorder     diabetic neuropathy    Past Surgical History  Procedure Date  . Ventral hernia repair     component separation, repair with biologic  . Vaginal hysterectomy   . Cardiac catheterization 12-17-10    '80's  . Abdominal hysterectomy 12-17-10    Vaginal Hysterectomy  . Cataract extraction w/ intraocular lens implant 12-17-10    bilateral  . Laparotomy 12/18/2010    Procedure: EXPLORATORY LAPAROTOMY;  Surgeon: Rolm Bookbinder, MD;  Location: [facility];  Service: General;  Laterality: N/A;  Abdominal Seroma Evacuation  . Back surgery   . Lumbar disc surgery 1970'-80's    X  3  . Eye surgery   . Wound debridement 03/20/2011    Procedure: DEBRIDEMENT ABDOMINAL WOUND;  Surgeon: Rolm Bookbinder, MD;  Location: [place];  Service: General;  Laterality: N/A;  debridement abdominal wall, placement of wound vac  . Abdominal hysterectomy   . Cataract extraction, bilateral   . Av fistula placement 09/30/2011    Procedure: ARTERIOVENOUS (AV) FISTULA CREATION;  Surgeon: Conrad [place], MD;  Location: [facility];  Service: Vascular;  Laterality: Left;  BRACHIAL-CEPHALIC  . Bascilic vein transposition 11/10/2011    Left arm   . Insertion of dialysis catheter 01/05/2012    Procedure: INSERTION OF DIALYSIS CATHETER;  Surgeon: Conrad [place], MD;  Location: [place];  Service: Vascular;  Laterality: N/A;  Right Internal Jugular Placement    History   Social History  . Marital Status: Widowed    Spouse Name: N/A    Number of Children: N/A  . Years of Education: N/A   Occupational History  . Not on file.   Social History Main Topics  . Smoking status: Former Smoker    Types: Cigarettes    Quit date: 12/16/1980  . Smokeless tobacco: Never Used  . Alcohol Use: No  . Drug Use: No  . Sexually Active: No   Other Topics Concern  . Not on file  Social History Narrative   ** Merged History Encounter **     Family History  Problem Relation Age of Onset  . Hypertension Mother   . Cancer Brother     spine  . Cancer Sister     brain  . Hypertension Maternal Grandmother   . Anesthesia problems Neg Hx   . Hypertension Father     Current Outpatient Prescriptions on File Prior to Visit  Medication Sig Dispense Refill  . acetaminophen (TYLENOL) 500 MG tablet Take 500 mg by mouth every 6 (six) hours as needed. For pain      . amLODipine (NORVASC) 10 MG tablet Take 10 mg by mouth daily.       Marland Kitchen aspirin EC 81 MG tablet Take 81 mg by mouth daily.        Marland Kitchen BAYER BREEZE 2 TEST DISK       . bisacodyl (DULCOLAX) 5 MG EC tablet Take 5 mg by mouth daily as needed. For constipation        . Blood Glucose Calibration (BAYER BREEZE 2 CONTROL) NORMAL LIQD       . Blood Glucose Calibration (TAI DOC CONTROL) NORMAL SOLN       . Blood Glucose Monitoring Suppl (CLEVER CHEK AUTO-CODE VOICE) DEVI       . Blood Glucose Monitoring Suppl (FORA V12 BLOOD GLUCOSE SYSTEM) DEVI       . calcitRIOL (ROCALTROL) 0.25 MCG capsule       . Calcium Carbonate-Vitamin D (CALCIUM 600 + D PO) Take 1 tablet by mouth daily.       . carvedilol (COREG) 25 MG tablet Take 25 mg by mouth 2 (two) times daily.      Marland Kitchen CLEVER CHEK AUTO-CODE VOICE test strip       . cloNIDine (CATAPRES) 0.3 MG tablet Take 0.3 mg by mouth 2 (two) times daily.      . ferrous sulfate 325 (65 FE) MG tablet Take 325 mg by mouth daily with breakfast.        . furosemide (LASIX) 80 MG tablet Take 80 mg by mouth 2 (two) times daily.       . hydrALAZINE (APRESOLINE) 50 MG tablet Take 50 mg by mouth 3 (three) times daily.       . insulin glargine (LANTUS) 100 UNIT/ML injection Inject 5 Units into the skin at bedtime. SSI      . insulin lispro (HUMALOG) 100 UNIT/ML injection Inject 5 Units into the skin 3 (three) times daily before meals.       . isosorbide mononitrate (IMDUR) 60 MG 24 hr tablet Take 60 mg by mouth daily.       Elmore Guise Devices (LANCING DEVICE) MISC       . Lite Touch Lancets MISC       . metoprolol succinate (TOPROL-XL) 50 MG 24 hr tablet 50 mg.       . Multiple Vitamin (MULITIVITAMIN WITH MINERALS) TABS Take 1 tablet by mouth daily.      Marland Kitchen NITROSTAT 0.4 MG SL tablet Take 0.4 mg by mouth every 5 (five) minutes as needed. For chest pain      . oxyCODONE (ROXICODONE) 5 MG immediate release tablet Take 1 tablet (5 mg total) by mouth every 4 (four) hours as needed for pain.  30 tablet  0  . Pharmacist Choice Lancets MISC       . rosuvastatin (CRESTOR) 20 MG tablet Take 20 mg by mouth daily.      Marland Kitchen  TOBRADEX ophthalmic ointment Place 1 application into the right eye daily.         No Known Allergies  Review of Systems  (Positive items checked otherwise negative)  General: [ ]  Weight loss, [ ]  Weight gain, [ ]   Loss of appetite, [ ]  Fever  Neurologic: [ ]  Dizziness, [ ]  Blackouts, [ ]  Headaches, [ ]  Seizure  Ear/Nose/Throat: [ ]  Change in eyesight, [ ]  Change in hearing, [ ]  Nose bleeds, [ ]  Sore throat  Vascular: [ ]  Pain in legs with walking, [ ]  Pain in feet while lying flat, [ ]  Non-healing ulcer, Stroke, [ ]  "Mini stroke", [ ]  Slurred speech, [ ]  Temporary blindness, [ ]  Blood clot in vein, [ ]  Phlebitis  Pulmonary: [ ]  Home oxygen, [ ]  Productive cough, [ ]  Bronchitis, [ ]  Coughing up blood,  [ ]  Asthma, [ ]  Wheezing  Musculoskeletal: [ ]  Arthritis, [ ]  Joint pain, [ ]  Muscle pain  Cardiac: [ ]  Chest pain, [ ]  Chest tightness/pressure, [ ]  Shortness of breath when lying flat, [ ]  Shortness of breath with exertion, [ ]  Palpitations, [ ]  Heart murmur, [ ]  Arrythmia,  [ ]  Atrial fibrillation  Hematologic: [ ]  Bleeding problems, [ ]  Clotting disorder, [ ]  Anemia  Psychiatric:  [ ]  Depression, [ ]  Anxiety, [ ]  Attention deficit disorder  Gastrointestinal:  [ ]  Black stool,[ ]   Blood in stool, [ ]  Peptic ulcer disease, [ ]  Reflux, [ ]  Hiatal hernia, [ ]  Trouble swallowing, [ ]  Diarrhea, [ ]  Constipation  Urinary:  [x]  Kidney disease, [ ]  Burning with urination, [ ]  Frequent urination, [ ]  Difficulty urinating  Skin: [ ]  Ulcers, [ ]  Rashes    Physical Examination  Filed Vitals:   01/16/12 1402  BP: 144/65  Pulse: 70   Pulm: CTAB CV RRR, Nl S1, S2 GI soft, obese, NTND LUE: Incision is healed, skin feels warm, hand grip is 5/5, sensation in digits is intact, palpable thrill, bruit can be auscultated   L arm access duplex (Date: 01/16/12)  Diameter: 8.1-9.1 mm  Depth: 1.44-3.05 cm  Medical Decision Making  [name] is a 66 y.o. year old female who presents s/p successful L 1st BRVT.  The patient is scheduled for L 2nd stage BRVT.  Risk, benefits, and alternatives to access  surgery were discussed.  The patient is aware the risks include but are not limited to: bleeding, infection, steal syndrome, nerve damage, ischemic monomelic neuropathy, failure to mature, need for additional procedures, death and stroke.  The patient agrees to proceed forward with the procedure.  Thank you for allowing [place] to participate in this patient's care.  Adele Barthel, MD [facility] [place] Office: [phone] Pager: [phone]

## 2012-01-20 ENCOUNTER — Other Ambulatory Visit: Payer: Self-pay

## 2012-01-23 ENCOUNTER — Encounter (HOSPITAL_COMMUNITY): Payer: Self-pay | Admitting: Respiratory Therapy

## 2012-01-27 ENCOUNTER — Encounter (HOSPITAL_COMMUNITY): Payer: Self-pay | Admitting: *Deleted

## 2012-01-27 MED ORDER — CEFAZOLIN SODIUM 1-5 GM-% IV SOLN: 1.0000 g | Freq: Once | INTRAVENOUS | Status: DC

## 2012-01-27 MED ORDER — DEXTROSE 5 % IV SOLN
3.0000 g | INTRAVENOUS | Status: AC
  Administered 2012-01-28: 3 g via INTRAVENOUS
  Filled 2012-01-27: qty 3000

## 2012-01-28 ENCOUNTER — Encounter (HOSPITAL_COMMUNITY)
Admission: RE | Disposition: A | Discharge status: Home / Self Care | Payer: Self-pay | Source: Ambulatory Visit | Attending: Vascular Surgery

## 2012-01-28 ENCOUNTER — Telehealth: Payer: Self-pay | Admitting: Vascular Surgery

## 2012-01-28 ENCOUNTER — Encounter (HOSPITAL_COMMUNITY): Payer: Self-pay | Admitting: Surgery

## 2012-01-28 ENCOUNTER — Ambulatory Visit (HOSPITAL_COMMUNITY): Payer: Medicare HMO | Admitting: Anesthesiology

## 2012-01-28 ENCOUNTER — Encounter (HOSPITAL_COMMUNITY): Payer: Self-pay | Admitting: Anesthesiology

## 2012-01-28 ENCOUNTER — Ambulatory Visit (HOSPITAL_COMMUNITY)
Admission: RE | Admit: 2012-01-28 | Discharge: 2012-01-28 | Disposition: A | Discharge status: Home / Self Care | Payer: Medicare HMO | Source: Ambulatory Visit | Attending: Vascular Surgery | Admitting: Vascular Surgery

## 2012-01-28 ENCOUNTER — Telehealth: Payer: Self-pay | Admitting: Neurosurgery

## 2012-01-28 DIAGNOSIS — D631 Anemia in chronic kidney disease: Secondary | ICD-10-CM | POA: Insufficient documentation

## 2012-01-28 DIAGNOSIS — I120 Hypertensive chronic kidney disease with stage 5 chronic kidney disease or end stage renal disease: Secondary | ICD-10-CM | POA: Insufficient documentation

## 2012-01-28 DIAGNOSIS — I509 Heart failure, unspecified: Secondary | ICD-10-CM | POA: Insufficient documentation

## 2012-01-28 DIAGNOSIS — N186 End stage renal disease: Secondary | ICD-10-CM | POA: Insufficient documentation

## 2012-01-28 DIAGNOSIS — N184 Chronic kidney disease, stage 4 (severe): Secondary | ICD-10-CM

## 2012-01-28 DIAGNOSIS — Z8614 Personal history of Methicillin resistant Staphylococcus aureus infection: Secondary | ICD-10-CM | POA: Insufficient documentation

## 2012-01-28 DIAGNOSIS — E1149 Type 2 diabetes mellitus with other diabetic neurological complication: Secondary | ICD-10-CM | POA: Insufficient documentation

## 2012-01-28 DIAGNOSIS — Z794 Long term (current) use of insulin: Secondary | ICD-10-CM | POA: Insufficient documentation

## 2012-01-28 DIAGNOSIS — G4733 Obstructive sleep apnea (adult) (pediatric): Secondary | ICD-10-CM | POA: Insufficient documentation

## 2012-01-28 DIAGNOSIS — I2510 Atherosclerotic heart disease of native coronary artery without angina pectoris: Secondary | ICD-10-CM | POA: Insufficient documentation

## 2012-01-28 DIAGNOSIS — N039 Chronic nephritic syndrome with unspecified morphologic changes: Secondary | ICD-10-CM | POA: Insufficient documentation

## 2012-01-28 DIAGNOSIS — E1142 Type 2 diabetes mellitus with diabetic polyneuropathy: Secondary | ICD-10-CM | POA: Insufficient documentation

## 2012-01-28 HISTORY — DX: Blindness, one eye, unspecified eye: H54.40

## 2012-01-28 HISTORY — PX: BASCILIC VEIN TRANSPOSITION: SHX5742

## 2012-01-28 LAB — POCT I-STAT, CHEM 8
Calcium, Ion: 0.95 mmol/L — ABNORMAL LOW (ref 1.13–1.30)
Glucose, Bld: 146 mg/dL — ABNORMAL HIGH (ref 70–99)
HCT: 30 % — ABNORMAL LOW (ref 36.0–46.0)
Hemoglobin: 10.2 g/dL — ABNORMAL LOW (ref 12.0–15.0)

## 2012-01-28 LAB — GLUCOSE, CAPILLARY: Glucose-Capillary: 97 mg/dL (ref 70–99)

## 2012-01-28 SURGERY — TRANSPOSITION, VEIN, BASILIC
Anesthesia: General | Site: Arm Upper | Laterality: Left | Wound class: Clean

## 2012-01-28 MED ORDER — MUPIROCIN 2 % EX OINT
TOPICAL_OINTMENT | CUTANEOUS | Status: AC
  Filled 2012-01-28: qty 22

## 2012-01-28 MED ORDER — OXYCODONE HCL 5 MG PO TABS: 5.0000 mg | ORAL_TABLET | ORAL | Status: DC | PRN

## 2012-01-28 MED ORDER — THROMBIN 20000 UNITS EX SOLR
CUTANEOUS | Status: AC
  Filled 2012-01-28: qty 20000

## 2012-01-28 MED ORDER — ACETAMINOPHEN 10 MG/ML IV SOLN
1000.0000 mg | Freq: Once | INTRAVENOUS | Status: AC | PRN
  Administered 2012-01-28: 1000 mg via INTRAVENOUS

## 2012-01-28 MED ORDER — HYDROMORPHONE HCL PF 1 MG/ML IJ SOLN
INTRAMUSCULAR | Status: AC
  Filled 2012-01-28: qty 1

## 2012-01-28 MED ORDER — BUPIVACAINE HCL (PF) 0.5 % IJ SOLN
INTRAMUSCULAR | Status: AC
  Filled 2012-01-28: qty 30

## 2012-01-28 MED ORDER — THROMBIN 20000 UNITS EX SOLR
CUTANEOUS | Status: DC | PRN
  Administered 2012-01-28 (×2): via TOPICAL

## 2012-01-28 MED ORDER — SUCCINYLCHOLINE CHLORIDE 20 MG/ML IJ SOLN
INTRAMUSCULAR | Status: DC | PRN
  Administered 2012-01-28: 100 mg via INTRAVENOUS

## 2012-01-28 MED ORDER — LIDOCAINE-EPINEPHRINE (PF) 1 %-1:200000 IJ SOLN
INTRAMUSCULAR | Status: AC
  Filled 2012-01-28: qty 10

## 2012-01-28 MED ORDER — PROPOFOL 10 MG/ML IV BOLUS
INTRAVENOUS | Status: DC | PRN
  Administered 2012-01-28: 150 mg via INTRAVENOUS

## 2012-01-28 MED ORDER — LIDOCAINE HCL (CARDIAC) 20 MG/ML IV SOLN
INTRAVENOUS | Status: DC | PRN
  Administered 2012-01-28: 75 mg via INTRAVENOUS

## 2012-01-28 MED ORDER — SODIUM CHLORIDE 0.9 % IV SOLN
INTRAVENOUS | Status: DC
  Administered 2012-01-28: 12:00:00 via INTRAVENOUS

## 2012-01-28 MED ORDER — FENTANYL CITRATE 0.05 MG/ML IJ SOLN
INTRAMUSCULAR | Status: DC | PRN
  Administered 2012-01-28: 100 ug via INTRAVENOUS

## 2012-01-28 MED ORDER — ONDANSETRON HCL 4 MG/2ML IJ SOLN: 4.0000 mg | Freq: Once | INTRAMUSCULAR | Status: DC | PRN

## 2012-01-28 MED ORDER — 0.9 % SODIUM CHLORIDE (POUR BTL) OPTIME
TOPICAL | Status: DC | PRN
  Administered 2012-01-28: 1000 mL

## 2012-01-28 MED ORDER — MUPIROCIN 2 % EX OINT
TOPICAL_OINTMENT | Freq: Two times a day (BID) | CUTANEOUS | Status: DC
  Filled 2012-01-28: qty 22

## 2012-01-28 MED ORDER — HYDROMORPHONE HCL PF 1 MG/ML IJ SOLN
0.2500 mg | INTRAMUSCULAR | Status: DC | PRN
Start: 2012-01-28 — End: 2012-01-28
  Administered 2012-01-28 (×2): 0.5 mg via INTRAVENOUS

## 2012-01-28 MED ORDER — SODIUM CHLORIDE 0.9 % IV SOLN
INTRAVENOUS | Status: DC | PRN
  Administered 2012-01-28 (×2): via INTRAVENOUS

## 2012-01-28 MED ORDER — ONDANSETRON HCL 4 MG/2ML IJ SOLN
INTRAMUSCULAR | Status: DC | PRN
  Administered 2012-01-28: 4 mg via INTRAVENOUS

## 2012-01-28 MED ORDER — SODIUM CHLORIDE 0.9 % IR SOLN
Status: DC | PRN
  Administered 2012-01-28: 13:00:00

## 2012-01-28 MED ORDER — ACETAMINOPHEN 10 MG/ML IV SOLN
INTRAVENOUS | Status: AC
  Filled 2012-01-28: qty 100

## 2012-01-28 SURGICAL SUPPLY — 51 items
BANDAGE GAUZE ELAST BULKY 4 IN (GAUZE/BANDAGES/DRESSINGS) ×2 IMPLANT
CANISTER SUCTION 2500CC (MISCELLANEOUS) ×2 IMPLANT
CLIP TI MEDIUM 24 (CLIP) ×2 IMPLANT
CLIP TI WIDE RED SMALL 24 (CLIP) ×2 IMPLANT
CLOTH BEACON ORANGE TIMEOUT ST (SAFETY) ×2 IMPLANT
CORDS BIPOLAR (ELECTRODE) IMPLANT
COVER PROBE W GEL 5X96 (DRAPES) ×2 IMPLANT
COVER SURGICAL LIGHT HANDLE (MISCELLANEOUS) ×2 IMPLANT
DECANTER SPIKE VIAL GLASS SM (MISCELLANEOUS) IMPLANT
DERMABOND ADVANCED (GAUZE/BANDAGES/DRESSINGS)
DERMABOND ADVANCED .7 DNX12 (GAUZE/BANDAGES/DRESSINGS) IMPLANT
DRAIN CHANNEL 10M FLAT 3/4 FLT (DRAIN) ×2 IMPLANT
DRAIN PENROSE 1/2X12 LTX STRL (WOUND CARE) IMPLANT
DRSG PAD ABDOMINAL 8X10 ST (GAUZE/BANDAGES/DRESSINGS) ×2 IMPLANT
ELECT REM PT RETURN 9FT ADLT (ELECTROSURGICAL) ×2
ELECTRODE REM PT RTRN 9FT ADLT (ELECTROSURGICAL) ×1 IMPLANT
EVACUATOR SILICONE 100CC (DRAIN) ×2 IMPLANT
GLOVE BIO SURGEON STRL SZ 6 (GLOVE) ×2 IMPLANT
GLOVE BIO SURGEON STRL SZ 6.5 (GLOVE) ×2 IMPLANT
GLOVE BIO SURGEON STRL SZ7 (GLOVE) ×2 IMPLANT
GLOVE BIO SURGEON STRL SZ8 (GLOVE) ×2 IMPLANT
GLOVE BIOGEL PI IND STRL 6.5 (GLOVE) ×2 IMPLANT
GLOVE BIOGEL PI IND STRL 7.0 (GLOVE) ×1 IMPLANT
GLOVE BIOGEL PI IND STRL 7.5 (GLOVE) ×1 IMPLANT
GLOVE BIOGEL PI INDICATOR 6.5 (GLOVE) ×2
GLOVE BIOGEL PI INDICATOR 7.0 (GLOVE) ×1
GLOVE BIOGEL PI INDICATOR 7.5 (GLOVE) ×1
GLOVE ECLIPSE 7.5 STRL STRAW (GLOVE) ×2 IMPLANT
GOWN PREVENTION PLUS XLARGE (GOWN DISPOSABLE) ×2 IMPLANT
GOWN STRL NON-REIN LRG LVL3 (GOWN DISPOSABLE) ×6 IMPLANT
KIT BASIN OR (CUSTOM PROCEDURE TRAY) ×2 IMPLANT
KIT ROOM TURNOVER OR (KITS) ×2 IMPLANT
NS IRRIG 1000ML POUR BTL (IV SOLUTION) ×2 IMPLANT
PACK CV ACCESS (CUSTOM PROCEDURE TRAY) ×2 IMPLANT
PAD ARMBOARD 7.5X6 YLW CONV (MISCELLANEOUS) ×4 IMPLANT
PIN SAFETY STERILE (MISCELLANEOUS) ×2 IMPLANT
SPONGE GAUZE 4X4 12PLY (GAUZE/BANDAGES/DRESSINGS) ×2 IMPLANT
SPONGE SURGIFOAM ABS GEL 100 (HEMOSTASIS) IMPLANT
STAPLER VISISTAT 35W (STAPLE) ×4 IMPLANT
SUT ETHILON 3 0 PS 1 (SUTURE) ×2 IMPLANT
SUT MNCRL AB 4-0 PS2 18 (SUTURE) ×2 IMPLANT
SUT PROLENE 6 0 BV (SUTURE) ×2 IMPLANT
SUT PROLENE 7 0 BV 1 (SUTURE) ×2 IMPLANT
SUT SILK 2 0 SH (SUTURE) ×2 IMPLANT
SUT VIC AB 3-0 SH 27 (SUTURE) ×4
SUT VIC AB 3-0 SH 27X BRD (SUTURE) ×4 IMPLANT
TAPE CLOTH SURG 4X10 WHT LF (GAUZE/BANDAGES/DRESSINGS) ×2 IMPLANT
TOWEL OR 17X24 6PK STRL BLUE (TOWEL DISPOSABLE) ×2 IMPLANT
TOWEL OR 17X26 10 PK STRL BLUE (TOWEL DISPOSABLE) ×2 IMPLANT
UNDERPAD 30X30 INCONTINENT (UNDERPADS AND DIAPERS) IMPLANT
WATER STERILE IRR 1000ML POUR (IV SOLUTION) ×2 IMPLANT

## 2012-01-28 NOTE — Progress Notes (Signed)
Report given to rebecca slade rn as caregiver

## 2012-01-28 NOTE — Anesthesia Postprocedure Evaluation (Signed)
  Anesthesia Post-op Note  Patient: [name]  Procedure(s) Performed: Procedure(s) (LRB) with comments: [place] (Left) - Second Stage   Patient Location: PACU  Anesthesia Type:General  Level of Consciousness: awake, alert  and oriented  Airway and Oxygen Therapy: Patient Spontanous Breathing  Post-op Pain: mild  Post-op Assessment: Post-op Vital signs reviewed, Patient's Cardiovascular Status Stable, Respiratory Function Stable, Patent Airway and Pain level controlled  Post-op Vital Signs: stable  Complications: No apparent anesthesia complications

## 2012-01-28 NOTE — Op Note (Signed)
OPERATIVE NOTE   PROCEDURE: left second stage basilic vein transposition (brachiobasilic arteriovenous fistula) placement  PRE-OPERATIVE DIAGNOSIS: successful first stage basilic vein transposition , end stage renal disease   POST-OPERATIVE DIAGNOSIS: same as above   SURGEON: Adele Barthel, MD  ASSISTANT(S): Wray Kearns, [facility]  ANESTHESIA: general  ESTIMATED BLOOD LOSS: 50 cc  FINDING(S): 1.  Basilic vein with early bifurcation into brachial vein system: > 8 mm throughout 2.  Palpable thrill at end of case  SPECIMEN(S):  none  INDICATIONS:   [name] is a 67 y.o. female who presents with end stage renal disease and successful left first stage basilic vein transposition.  The patient is scheduled for left second stage basilic vein transposition.  The patient is aware the risks include but are not limited to: bleeding, infection, steal syndrome, nerve damage, ischemic monomelic neuropathy, failure to mature, and need for additional procedures.  The patient is aware of the risks of the procedure and elects to proceed forward.  DESCRIPTION: After full informed written consent was obtained from the patient, the patient was brought back to the operating room and placed supine upon the operating table.  Prior to induction, the patient received IV antibiotics.   After obtaining adequate anesthesia, the patient was then prepped and draped in the standard fashion for a left arm access procedure.  I turned my attention first to identifying the patient's brachiobasilic arteriovenous fistula.  Using SonoSite guidance, the location of this fistula was marked out on the skin.   I made an longitudinal incision over the fistula from its arterial anastomosis up to its axillary extent.  I carefully dissected the fistula away from its adjacent nerves.  This was an extended dissection due to the patient's morbid obesity.   Eventually the entirety of this fistula was mobilized and I dissected a plane on  top of the bicipital fascia with electrocautery.  Also with electrocautery, I developed a curvilinear subcutaneous trough in which to transpose the fistula into.  The fistula was secured in its new location with loosely placed interrupt 3-0 Vicryl stitches, taking care to avoid compressing the fistula.  The deep subcutaneous tissue was inspected for bleeding.  Bleeding was controlled with electrocautery and placement of large pieces of thrombin and gelfoam.  I washed out the surgical site after waiting a few minutes, and there was no further bleeding.  Due to the severe obesity, I elected to place a drain in the deep subcutaneous tissue.   I made a stab incision lateral to the prior incision and dissected through the subcutaneous tissue with a tonsil clamp.  I then delivered a 10 JP drain through the skin with the clamp.  I sharply adjusted the length of the JP drain, laying it in the deep subcutaneous tissue.   The deep subcutaneous tissue was reapproximated with mattress sutures of 3-0 Vicryl to eliminate some of the dead space.  The superficial subcutaneous tissue was then reapproximated along the incision line with a running stitch of 3-0 Vicryl.  The skin was then reapproximated with staples.  The skin was then cleaned and dried.  Sterile bandages were applied to the incision and held in place with a Kerlix wrap.  The patient tolerated this procedure well.   COMPLICATIONS: none  CONDITION: stable  Adele Barthel, MD [facility] [place] Office: [phone] Pager: [phone]  01/28/2012, 2:32 PM

## 2012-01-28 NOTE — Telephone Encounter (Addendum)
Message copied by Lujean Amel on Wed Jan 28, 2012  3:46 PM ------      Message from: Alfonso Patten      Created: Wed Jan 28, 2012  3:01 PM                   ----- Message -----         From: Richrd Prime, PA         Sent: 01/28/2012   2:36 PM           To: Alfonso Patten, RN            Pt has F/U this Friday for nurse to remove JP drain            Pt needs 2 week appt with Bridgett Larsson for  AVF check and staple removal  I scheduled an appt for the above pt on 02/13/12 at 12:45pm w/ [facility]. The phone number was busy x2 but I did mail an appt letter.awt

## 2012-01-28 NOTE — Anesthesia Postprocedure Evaluation (Signed)
  Anesthesia Post-op Note  Patient: [name]  Procedure(s) Performed: Procedure(s) (LRB) with comments: [place] (Left) - Second Stage   Patient Location: PACU  Anesthesia Type:General  Level of Consciousness: awake, alert  and oriented  Airway and Oxygen Therapy: Patient Spontanous Breathing  Post-op Pain: none  Post-op Assessment: Post-op Vital signs reviewed, Patient's Cardiovascular Status Stable, Respiratory Function Stable, Patent Airway and No signs of Nausea or vomiting  Post-op Vital Signs: Reviewed and stable  Complications: No apparent anesthesia complications

## 2012-01-28 NOTE — Telephone Encounter (Addendum)
Message copied by Lujean Amel on Wed Jan 28, 2012  3:36 PM ------      Message from: Alfonso Patten      Created: Wed Jan 28, 2012  3:03 PM       No nurse visit give to Rusty      ----- Message -----         From: Conrad Wagner, MD         Sent: 01/28/2012   2:38 PM           To: Patrici Ranks, Alfonso Patten, RN            [name]      [id number]      1945-03-19            PROCEDURE:      left second stage basilic vein transposition (brachiobasilic arteriovenous fistula) placement            Asst: Wray Kearns, [facility]            Follow-up: this Friday (Nurse visit for JP removal)            Pt is scheduled to see Weston Brass on Friday 01/30/12 at 1:40pm. Rollene Fare to notify pt at discharge.awt

## 2012-01-28 NOTE — Anesthesia Procedure Notes (Signed)
Procedure Name: Intubation Date/Time: 01/28/2012 12:27 PM Performed by: Eligha Bridegroom Pre-anesthesia Checklist: Patient identified, Timeout performed, Emergency Drugs available and Suction available Patient Re-evaluated:Patient Re-evaluated prior to inductionOxygen Delivery Method: Circle system utilized Preoxygenation: Pre-oxygenation with 100% oxygen Intubation Type: IV induction Laryngoscope Size: Mac and 4 Grade View: Grade I Tube type: Oral Tube size: 7.0 mm Number of attempts: 1 Airway Equipment and Method: Stylet Placement Confirmation: ETT inserted through vocal cords under direct vision and breath sounds checked- equal and bilateral Secured at: 22 cm Tube secured with: Tape Dental Injury: Teeth and Oropharynx as per pre-operative assessment

## 2012-01-28 NOTE — Anesthesia Preprocedure Evaluation (Signed)
Anesthesia Evaluation  Patient identified by MRN, date of birth, ID band Patient awake    Reviewed: Allergy & Precautions, H&P , NPO status , Patient's Chart, lab work & pertinent test results  Airway Mallampati: II      Dental  (+) Edentulous Upper and Edentulous Lower   Pulmonary  breath sounds clear to auscultation        Cardiovascular Rhythm:Regular     Neuro/Psych    GI/Hepatic   Endo/Other    Renal/GU      Musculoskeletal   Abdominal (+) + obese,   Peds  Hematology   Anesthesia Other Findings   Reproductive/Obstetrics                           Anesthesia Physical Anesthesia Plan  ASA: III  Anesthesia Plan: General   Post-op Pain Management:    Induction: Intravenous  Airway Management Planned: LMA  Additional Equipment:   Intra-op Plan:   Post-operative Plan:   Informed Consent: I have reviewed the patients History and Physical, chart, labs and discussed the procedure including the risks, benefits and alternatives for the proposed anesthesia with the patient or authorized representative who has indicated his/her understanding and acceptance.     Plan Discussed with: CRNA and Surgeon  Anesthesia Plan Comments: (ESRD last HD 1/7 K-3.4 Type 2 DM glucose 104 Htn H/O CAD per records last cath 1995 no angina H/O diabetic retinopathy  Plan GA with LMA)        Anesthesia Quick Evaluation

## 2012-01-28 NOTE — Interval H&P Note (Signed)
[facility] [place]  History and Physical Update  The patient was interviewed and re-examined.  The patient's previous History and Physical has been reviewed and is unchanged.  There is no change in the plan of care: L 2nd stage brachial vein transposition.  Adele Barthel, MD [facility] [place] Office: [phone] Pager: [phone]  01/28/2012, 8:41 AM

## 2012-01-28 NOTE — Transfer of Care (Signed)
Immediate Anesthesia Transfer of Care Note  Patient: [name]  Procedure(s) Performed: Procedure(s) (LRB) with comments: [place] (Left) - Second Stage   Patient Location: PACU  Anesthesia Type:General  Level of Consciousness: awake and alert   Airway & Oxygen Therapy: Patient Spontanous Breathing and Patient connected to face mask oxygen  Post-op Assessment: Report given to PACU RN and Post -op Vital signs reviewed and stable  Post vital signs: Reviewed and stable  Complications: No apparent anesthesia complications

## 2012-01-28 NOTE — Progress Notes (Signed)
Pt here for procedure.  Denies SOB, chest pain.  Will proceed on w/preop.  Pt reports had sleep study greater than 5 years.  Uses CPAP machine at home. Denies knowing settings.  Did not bring mask w/ her.

## 2012-01-28 NOTE — Preoperative (Signed)
Beta Blockers   Reason not to administer Beta Blockers:Not Applicable 

## 2012-01-28 NOTE — H&P (View-Only) (Signed)
[facility] [place]  Postoperative Access Visit  History of Present Illness  [name] is a 67 y.o. year old female who presents for postoperative follow-up for: L 1st BRVT (Date: 09/30/11).  The patient's wounds are healed.  The patient notes mild steal symptoms.  The patient is able to complete their activities of daily living.  The patient's current symptoms are: mild intermittent paraesthesia in hand.  Her steal sx are improved from previous and tolerable per the patient.   Past Medical History  Diagnosis Date  . Arthritis   . Abdominal abscess 12-17-10    abdominal abscesses x2 ? one at this time  . Staph aureus infection November 2012   . Myocardial infarction 12-17-10    80's-abnormal testing showed  . Swelling of both ankles 12-17-10  . Blood transfusion, without reported diagnosis 12-17-10    "thinks in [facility]  . Chronic kidney disease   . Chronic renal insufficiency 2009    sees Dr. Florene Glen  every 4-5 mo  . Anemia   . Diabetes mellitus     type 2 IDDM x 6-7 years  . Coronary artery disease     Dr Kadakia-cardiologist 2-3 x year  . CHF (congestive heart failure)   . Diabetes mellitus   . Dysrhythmia     irregular, "skips beats"  . Shortness of breath   . Sleep apnea 2012    CPAP  . Hypertension     sees Dr. Willey Blade  . Neuromuscular disorder     diabetic neuropathy    Past Surgical History  Procedure Date  . Ventral hernia repair     component separation, repair with biologic  . Vaginal hysterectomy   . Cardiac catheterization 12-17-10    '80's  . Abdominal hysterectomy 12-17-10    Vaginal Hysterectomy  . Cataract extraction w/ intraocular lens implant 12-17-10    bilateral  . Laparotomy 12/18/2010    Procedure: EXPLORATORY LAPAROTOMY;  Surgeon: Rolm Bookbinder, MD;  Location: [facility];  Service: General;  Laterality: N/A;  Abdominal Seroma Evacuation  . Back surgery   . Lumbar disc surgery 1970'-80's    X  3  . Eye surgery   . Wound debridement 03/20/2011    Procedure: DEBRIDEMENT ABDOMINAL WOUND;  Surgeon: Rolm Bookbinder, MD;  Location: [place];  Service: General;  Laterality: N/A;  debridement abdominal wall, placement of wound vac  . Abdominal hysterectomy   . Cataract extraction, bilateral   . Av fistula placement 09/30/2011    Procedure: ARTERIOVENOUS (AV) FISTULA CREATION;  Surgeon: Conrad [place], MD;  Location: [place];  Service: Vascular;  Laterality: Left;  BRACHIAL-CEPHALIC  . Bascilic vein transposition 11/10/2011    Left arm   . Insertion of dialysis catheter 01/05/2012    Procedure: INSERTION OF DIALYSIS CATHETER;  Surgeon: Conrad Almena, MD;  Location: [place];  Service: Vascular;  Laterality: N/A;  Right Internal Jugular Placement    History   Social History  . Marital Status: Widowed    Spouse Name: N/A    Number of Children: N/A  . Years of Education: N/A   Occupational History  . Not on file.   Social History Main Topics  . Smoking status: Former Smoker    Types: Cigarettes    Quit date: 12/16/1980  . Smokeless tobacco: Never Used  . Alcohol Use: No  . Drug Use: No  . Sexually Active: No   Other Topics Concern  . Not on file  Social History Narrative   ** Merged History Encounter **     Family History  Problem Relation Age of Onset  . Hypertension Mother   . Cancer Brother     spine  . Cancer Sister     brain  . Hypertension Maternal Grandmother   . Anesthesia problems Neg Hx   . Hypertension Father     Current Outpatient Prescriptions on File Prior to Visit  Medication Sig Dispense Refill  . acetaminophen (TYLENOL) 500 MG tablet Take 500 mg by mouth every 6 (six) hours as needed. For pain      . amLODipine (NORVASC) 10 MG tablet Take 10 mg by mouth daily.       Marland Kitchen aspirin EC 81 MG tablet Take 81 mg by mouth daily.        Marland Kitchen BAYER BREEZE 2 TEST DISK       . bisacodyl (DULCOLAX) 5 MG EC tablet Take 5 mg by mouth daily as needed. For constipation        . Blood Glucose Calibration (BAYER BREEZE 2 CONTROL) NORMAL LIQD       . Blood Glucose Calibration (TAI DOC CONTROL) NORMAL SOLN       . Blood Glucose Monitoring Suppl (CLEVER CHEK AUTO-CODE VOICE) DEVI       . Blood Glucose Monitoring Suppl (FORA V12 BLOOD GLUCOSE SYSTEM) DEVI       . calcitRIOL (ROCALTROL) 0.25 MCG capsule       . Calcium Carbonate-Vitamin D (CALCIUM 600 + D PO) Take 1 tablet by mouth daily.       . carvedilol (COREG) 25 MG tablet Take 25 mg by mouth 2 (two) times daily.      Marland Kitchen CLEVER CHEK AUTO-CODE VOICE test strip       . cloNIDine (CATAPRES) 0.3 MG tablet Take 0.3 mg by mouth 2 (two) times daily.      . ferrous sulfate 325 (65 FE) MG tablet Take 325 mg by mouth daily with breakfast.        . furosemide (LASIX) 80 MG tablet Take 80 mg by mouth 2 (two) times daily.       . hydrALAZINE (APRESOLINE) 50 MG tablet Take 50 mg by mouth 3 (three) times daily.       . insulin glargine (LANTUS) 100 UNIT/ML injection Inject 5 Units into the skin at bedtime. SSI      . insulin lispro (HUMALOG) 100 UNIT/ML injection Inject 5 Units into the skin 3 (three) times daily before meals.       . isosorbide mononitrate (IMDUR) 60 MG 24 hr tablet Take 60 mg by mouth daily.       Elmore Guise Devices (LANCING DEVICE) MISC       . Lite Touch Lancets MISC       . metoprolol succinate (TOPROL-XL) 50 MG 24 hr tablet 50 mg.       . Multiple Vitamin (MULITIVITAMIN WITH MINERALS) TABS Take 1 tablet by mouth daily.      Marland Kitchen NITROSTAT 0.4 MG SL tablet Take 0.4 mg by mouth every 5 (five) minutes as needed. For chest pain      . oxyCODONE (ROXICODONE) 5 MG immediate release tablet Take 1 tablet (5 mg total) by mouth every 4 (four) hours as needed for pain.  30 tablet  0  . Pharmacist Choice Lancets MISC       . rosuvastatin (CRESTOR) 20 MG tablet Take 20 mg by mouth daily.      Marland Kitchen  TOBRADEX ophthalmic ointment Place 1 application into the right eye daily.         No Known Allergies  Review of Systems  (Positive items checked otherwise negative)  General: [ ]  Weight loss, [ ]  Weight gain, [ ]   Loss of appetite, [ ]  Fever  Neurologic: [ ]  Dizziness, [ ]  Blackouts, [ ]  Headaches, [ ]  Seizure  Ear/Nose/Throat: [ ]  Change in eyesight, [ ]  Change in hearing, [ ]  Nose bleeds, [ ]  Sore throat  Vascular: [ ]  Pain in legs with walking, [ ]  Pain in feet while lying flat, [ ]  Non-healing ulcer, Stroke, [ ]  "Mini stroke", [ ]  Slurred speech, [ ]  Temporary blindness, [ ]  Blood clot in vein, [ ]  Phlebitis  Pulmonary: [ ]  Home oxygen, [ ]  Productive cough, [ ]  Bronchitis, [ ]  Coughing up blood,  [ ]  Asthma, [ ]  Wheezing  Musculoskeletal: [ ]  Arthritis, [ ]  Joint pain, [ ]  Muscle pain  Cardiac: [ ]  Chest pain, [ ]  Chest tightness/pressure, [ ]  Shortness of breath when lying flat, [ ]  Shortness of breath with exertion, [ ]  Palpitations, [ ]  Heart murmur, [ ]  Arrythmia,  [ ]  Atrial fibrillation  Hematologic: [ ]  Bleeding problems, [ ]  Clotting disorder, [ ]  Anemia  Psychiatric:  [ ]  Depression, [ ]  Anxiety, [ ]  Attention deficit disorder  Gastrointestinal:  [ ]  Black stool,[ ]   Blood in stool, [ ]  Peptic ulcer disease, [ ]  Reflux, [ ]  Hiatal hernia, [ ]  Trouble swallowing, [ ]  Diarrhea, [ ]  Constipation  Urinary:  [x]  Kidney disease, [ ]  Burning with urination, [ ]  Frequent urination, [ ]  Difficulty urinating  Skin: [ ]  Ulcers, [ ]  Rashes    Physical Examination  Filed Vitals:   01/16/12 1402  BP: 144/65  Pulse: 70   Pulm: CTAB CV RRR, Nl S1, S2 GI soft, obese, NTND LUE: Incision is healed, skin feels warm, hand grip is 5/5, sensation in digits is intact, palpable thrill, bruit can be auscultated   L arm access duplex (Date: 01/16/12)  Diameter: 8.1-9.1 mm  Depth: 1.44-3.05 cm  Medical Decision Making  [name] is a 67 y.o. year old female who presents s/p successful L 1st BRVT.  The patient is scheduled for L 2nd stage BRVT.  Risk, benefits, and alternatives to access  surgery were discussed.  The patient is aware the risks include but are not limited to: bleeding, infection, steal syndrome, nerve damage, ischemic monomelic neuropathy, failure to mature, need for additional procedures, death and stroke.  The patient agrees to proceed forward with the procedure.  Thank you for allowing [place] to participate in this patient's care.  Adele Barthel, MD [facility] [place] Office: [phone] Pager: [phone]

## 2012-01-29 ENCOUNTER — Encounter (HOSPITAL_COMMUNITY): Payer: Self-pay | Admitting: Vascular Surgery

## 2012-01-29 ENCOUNTER — Encounter: Payer: Self-pay | Admitting: Vascular Surgery

## 2012-01-30 ENCOUNTER — Ambulatory Visit (INDEPENDENT_AMBULATORY_CARE_PROVIDER_SITE_OTHER): Payer: Medicare HMO | Admitting: Neurosurgery

## 2012-01-30 ENCOUNTER — Encounter: Payer: Self-pay | Admitting: Neurosurgery

## 2012-01-30 ENCOUNTER — Ambulatory Visit: Payer: Medicare HMO | Admitting: Neurosurgery

## 2012-01-30 ENCOUNTER — Ambulatory Visit: Payer: Medicare HMO | Admitting: Vascular Surgery

## 2012-01-30 VITALS — BP 128/73 | HR 83 | Resp 16 | Ht 65.0 in | Wt 271.0 lb

## 2012-01-30 DIAGNOSIS — N186 End stage renal disease: Secondary | ICD-10-CM

## 2012-01-30 NOTE — Progress Notes (Signed)
Subjective:     Patient ID: [name], female   DOB: [birth year], 67 y.o.   MRN: [id number]  HPI: 67 year old female patient who underwent second stage left basilic vein transposition 01/28/2012 with Dr. Bridgett Larsson. The patient comes in today for removal of JP drain. Patient has diffuse tenderness along the incision line and otherwise is doing well. There is no redness or drainage along the incision.   Review of Systems: 12 point review of systems is notable for the difficulties described above otherwise unremarkable     Objective:   Physical Exam: Afebrile, vital signs are stable, incision looks good there is no redness or drainage and it appears to be healing well. The JP drain has approximately 2 cc of clear fluid     Assessment:     3 days status post second stage left basilic vein transposition doing well. The JP drain was removed without difficulty there was no residual bleeding.    Plan:     The patient return next week for staple removal with Dr. Bridgett Larsson. The patient's questions were encouraged and answered, she is in agreement with this plan.  Beatris Ship ANP  Clinic M.D.: Bridgett Larsson

## 2012-02-05 NOTE — H&P (Signed)
[facility] [place]  History and Physical Addendum  I mistakenly documented a L 1st brachial vein transposition in an office note.  This pt underwent a L 1st basilic vein transposition so she underwent a L 2nd stage basilic vein transposition today.  Adele Barthel, MD [facility] [place] Office: [phone] Pager: [phone]

## 2012-02-11 ENCOUNTER — Telehealth: Payer: Self-pay

## 2012-02-11 DIAGNOSIS — G8918 Other acute postprocedural pain: Secondary | ICD-10-CM

## 2012-02-11 MED ORDER — HYDROCODONE-ACETAMINOPHEN 5-325 MG PO TABS: 1.0000 | ORAL_TABLET | Freq: Four times a day (QID) | ORAL | Status: DC | PRN

## 2012-02-11 NOTE — Telephone Encounter (Signed)
Phone call from pt. With c/o pain in left arm incision.  Stated the staples are intact, but describes one area that the staple is more uncomfortable and has some redness surrounding it.  Denies any drainage from incision.  Rates pain at level 4/10.  Requesting refill on pain medication.  Has f/u appt. On 02/13/12.  Advised to keep scheduled appt. With Dr. Bridgett Larsson, and that the nurse will phone in pain medication per standing order.  Verb. Understanding.

## 2012-02-12 ENCOUNTER — Encounter: Payer: Self-pay | Admitting: Vascular Surgery

## 2012-02-13 ENCOUNTER — Ambulatory Visit (INDEPENDENT_AMBULATORY_CARE_PROVIDER_SITE_OTHER): Payer: Medicare HMO | Admitting: Vascular Surgery

## 2012-02-13 ENCOUNTER — Encounter (INDEPENDENT_AMBULATORY_CARE_PROVIDER_SITE_OTHER): Payer: Medicare HMO | Admitting: *Deleted

## 2012-02-13 ENCOUNTER — Encounter: Payer: Self-pay | Admitting: Vascular Surgery

## 2012-02-13 ENCOUNTER — Other Ambulatory Visit: Payer: Self-pay | Admitting: *Deleted

## 2012-02-13 VITALS — BP 137/63 | HR 81 | Temp 99.9°F | Ht 65.0 in | Wt 267.4 lb

## 2012-02-13 DIAGNOSIS — N186 End stage renal disease: Secondary | ICD-10-CM

## 2012-02-13 DIAGNOSIS — T82898A Other specified complication of vascular prosthetic devices, implants and grafts, initial encounter: Secondary | ICD-10-CM

## 2012-02-13 DIAGNOSIS — T82598A Other mechanical complication of other cardiac and vascular devices and implants, initial encounter: Secondary | ICD-10-CM

## 2012-02-13 DIAGNOSIS — N19 Unspecified kidney failure: Secondary | ICD-10-CM

## 2012-02-13 DIAGNOSIS — Z48812 Encounter for surgical aftercare following surgery on the circulatory system: Secondary | ICD-10-CM

## 2012-02-13 DIAGNOSIS — M7989 Other specified soft tissue disorders: Secondary | ICD-10-CM

## 2012-02-13 MED ORDER — INDOMETHACIN 50 MG PO CAPS: 50.0000 mg | ORAL_CAPSULE | Freq: Two times a day (BID) | ORAL | Status: DC

## 2012-02-13 NOTE — Progress Notes (Signed)
[facility] [place]  Postoperative Access Visit  History of Present Illness  [name] is a 67 y.o. year old female who presents for postoperative follow-up for: L 2nd stage BVT (Date: 01/28/12).  The patient's wounds are healed.  JP exit site sealed without drainage.  The patient notes left hand pain with difficulty using hand.  The patient is not able to complete their activities of daily living.  The patient's current symptoms are: L hand pain and swelling.  Pt states the pain and swelling is similar to a previous gout attack she has had in the L arm  Physical Examination  Filed Vitals:   02/13/12 1013  BP: 137/63  Pulse: 81  Temp: 99.9 F (37.7 C)   LUE: Incision is healed, staples in place, skin feels warm including hand, hand grip is 5/5, sensation in digits is intact, palpable thrill, bruit can be auscultated , L hand is warm and swollen but forearm is that not as swollen  L arterial duplex (Date: 02/13/11) - triphasic waveforms in both hands even without compression - patient could not tolerate compression of the left arm  Medical Decision Making  [name] is a 67 y.o. year old female who presents s/p L 2nd stage BVT, L hand pain and swelling   It isn't clear to me if this patient has some degree of venous stenosis or if she an acute gout attack in L hand.  The steal study is not consistent with steal syndrome as the patient has triphasic radial and ulnar signals even without compression of the graft.  I have given the patient a prescription for Indocin 50 mg 1 PO BID with food to sx treat the L hand assuming it's gout.  I will also wrap the L hand and forearm with an ACE wrap.  I have also arranged a follow-up appt with her PCP to evaluate for possible gout in the L hand  The patient's access will be ready for use in 2 weeks.  She will follow up with [place] in 2 weeks to re-examine the L arm.  The patient's tunneled dialysis catheter can  be removed after two successful cannulations and completed dialysis treatments.  Thank you for allowing [place] to participate in this patient's care.  Adele Barthel, MD [facility] [place] Office: [phone] Pager: [phone]

## 2012-02-16 ENCOUNTER — Telehealth (INDEPENDENT_AMBULATORY_CARE_PROVIDER_SITE_OTHER): Payer: Self-pay

## 2012-02-16 NOTE — Telephone Encounter (Signed)
The pt would like an appointment on a Monday, Wednesday or Friday for wound check.  She has been having pus and blood draining.  Dr Donne Hazel did the hernia surgery.  I offered an appointment today for urgent office but she declined.

## 2012-02-16 NOTE — Telephone Encounter (Signed)
Called pt back after message of wound draining pus and blood since last Thursday. I advised pt that she needs to be seen so I offered Tuesday but the pt has dialysis on Tuesday. I made an appt for pt on Wednesday 1/29 arrive at 8:30.

## 2012-02-18 ENCOUNTER — Encounter (INDEPENDENT_AMBULATORY_CARE_PROVIDER_SITE_OTHER): Payer: Medicare HMO | Admitting: General Surgery

## 2012-02-21 IMAGING — CT CT ABD-PELV W/O CM
3 of 4 series · 13 of 36 positions shown, 19 images · IV contrast (READICAT/WATER)
Comparison: 07/09/2010

CLINICAL DATA: Recent incarcerated hernia repair.  Abdominal pain.
Prior hysterectomy.Renal insufficiency

CT ABDOMEN AND PELVIS WITHOUT CONTRAST
TECHNIQUE: Multidetector CT imaging of the abdomen and pelvis was
performed following the standard protocol without intravenous
contrast.

[Series 3: abd/pelvis w/o · axial · non-contrast · 0.94mm/px · z∈[-332,+23]mm · 8 of 93 slices shown, 13 images]
[im 11/93  soft-tissue]
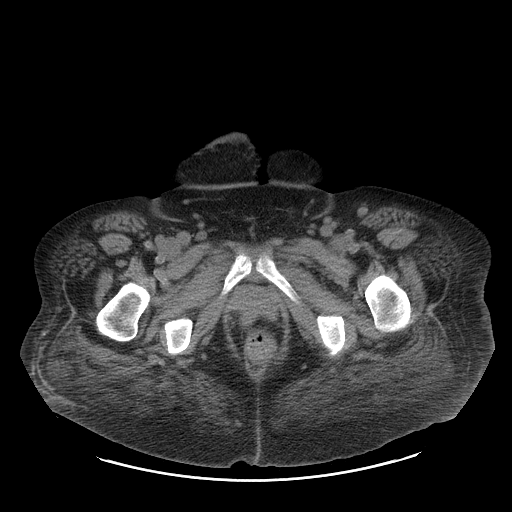
[im 11/93  bone]
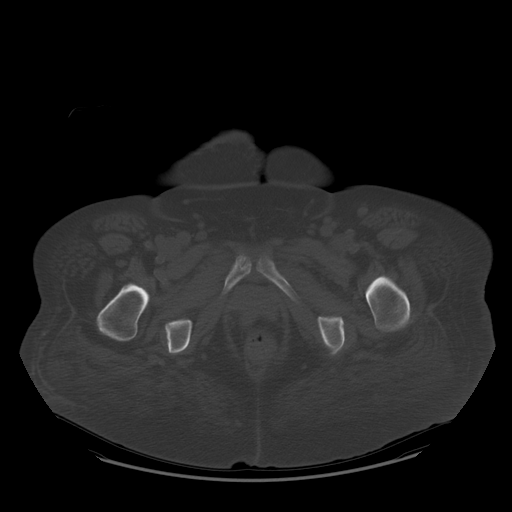
[im 21/93  soft-tissue]
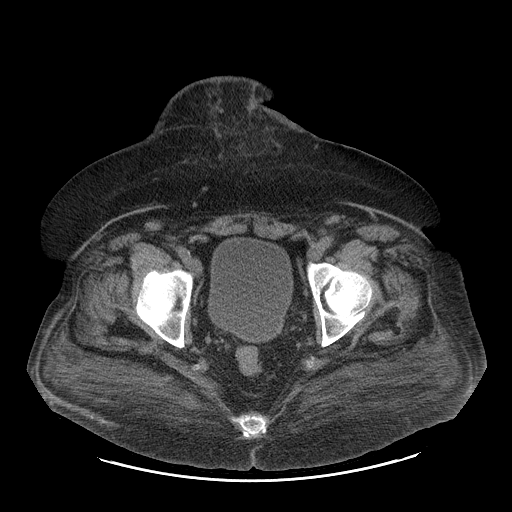
[im 31/93  soft-tissue]
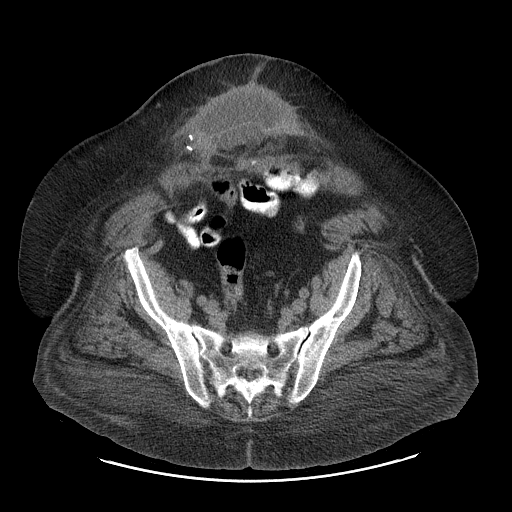
[im 41/93  soft-tissue]
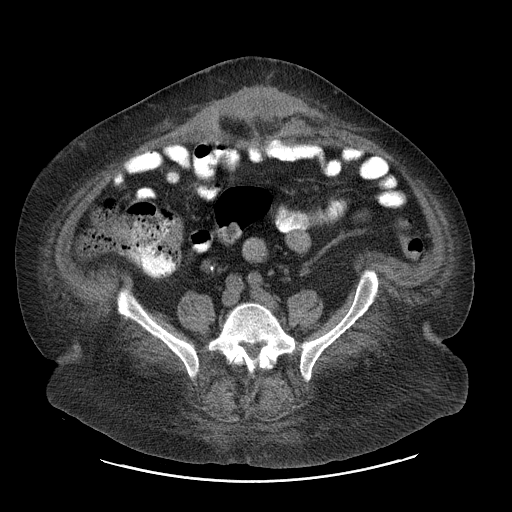
[im 52/93  soft-tissue]
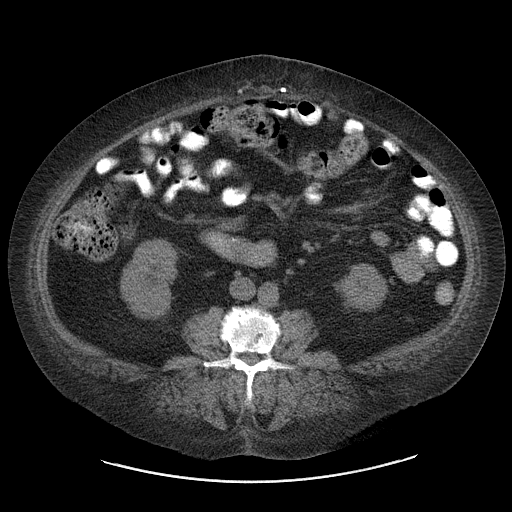
[im 52/93  lung]
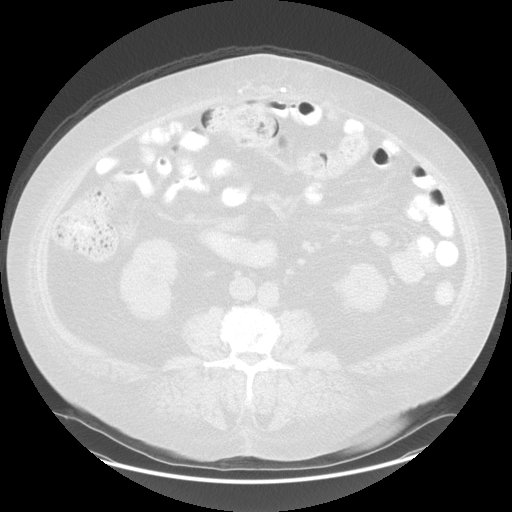
[im 62/93  soft-tissue]
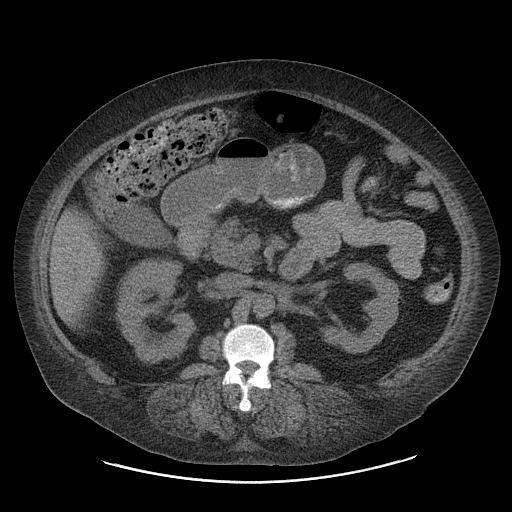
[im 62/93  lung]
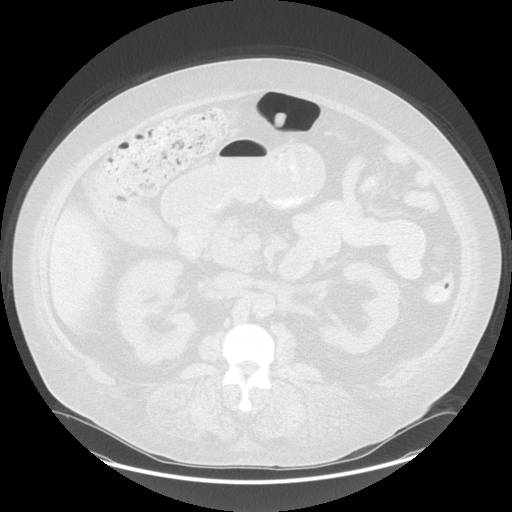
[im 72/93  soft-tissue]
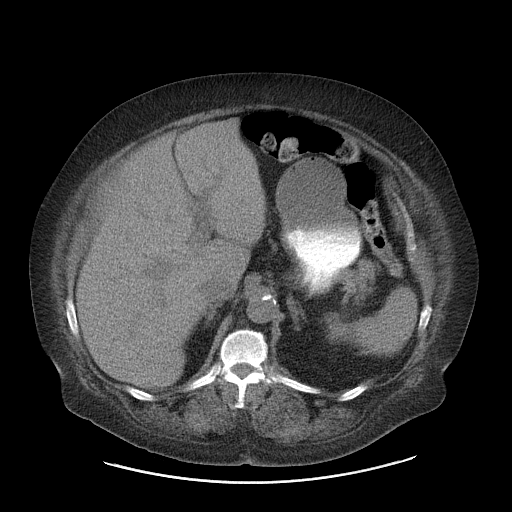
[im 72/93  lung]
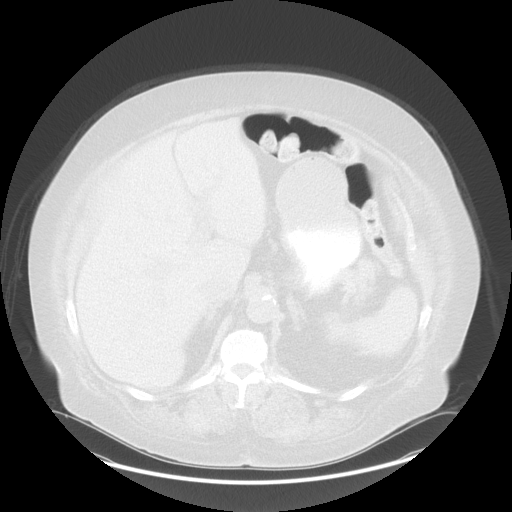
[im 82/93  soft-tissue]
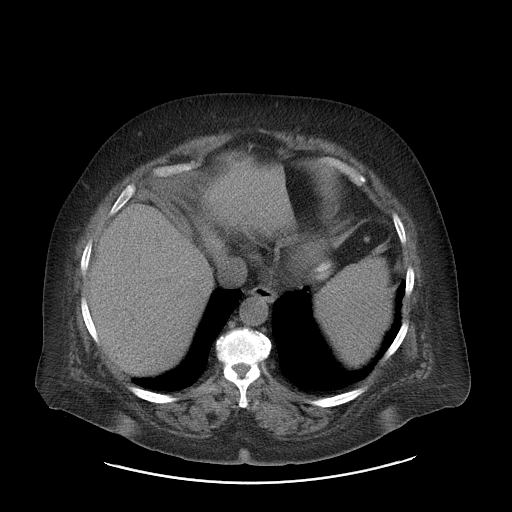
[im 82/93  lung]
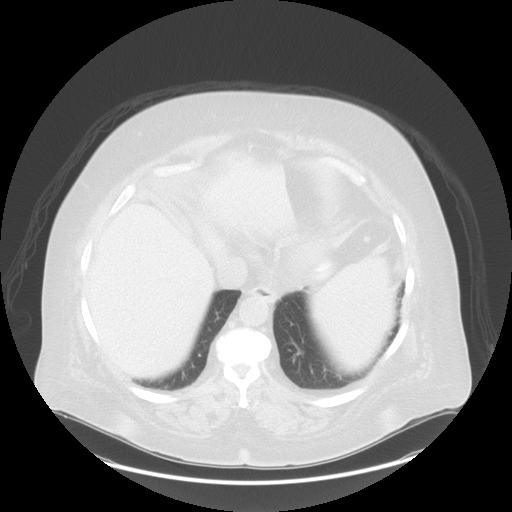

[Series 601: coronal body · coronal · 0.94mm/px · 1 of 161 slices shown, 2 images]
[im 54/161  soft-tissue]
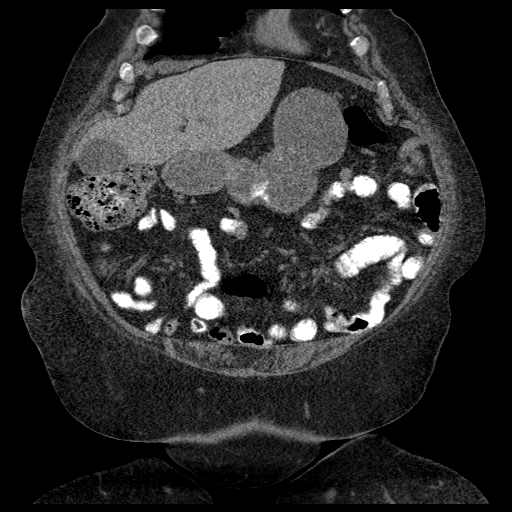
[im 54/161  bone]
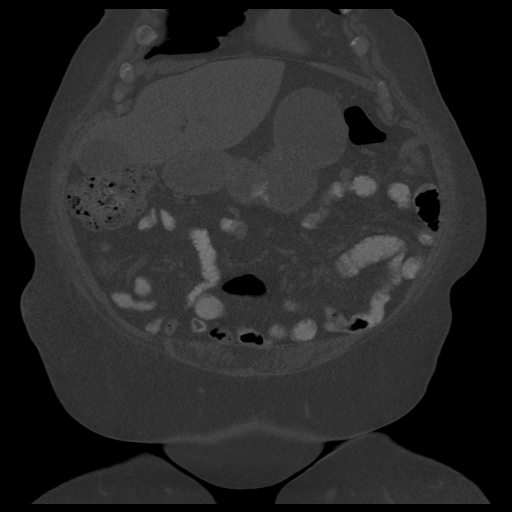

[Series 602: sagittal body · sagittal · 0.94mm/px · 4 of 191 slices shown]
[im 21/191  soft-tissue]
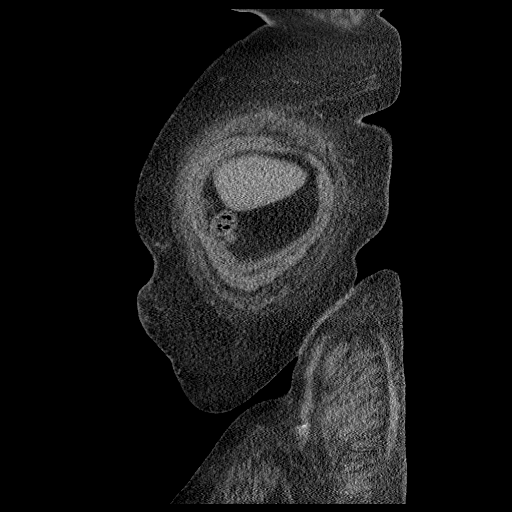
[im 41/191  soft-tissue]
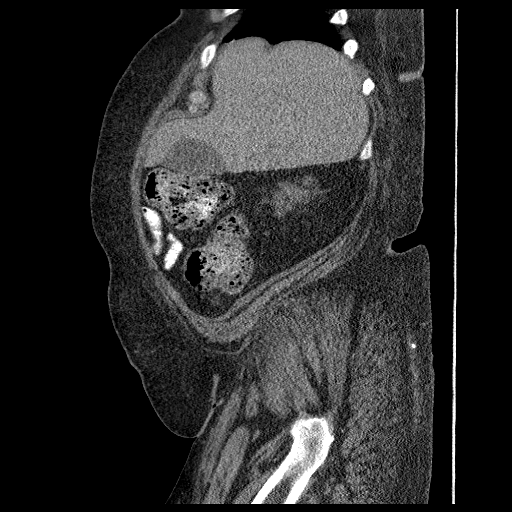
[im 61/191  soft-tissue]
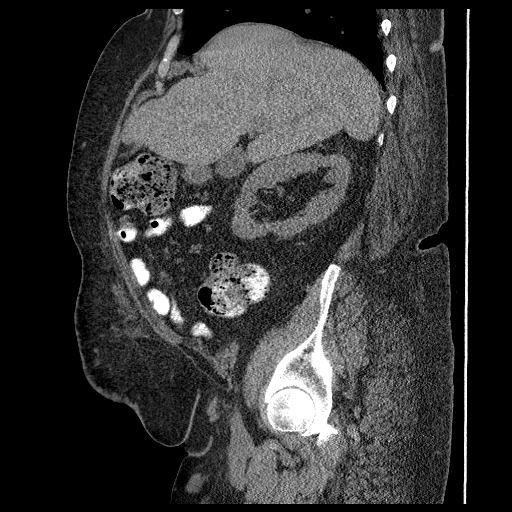
[im 81/191  soft-tissue]
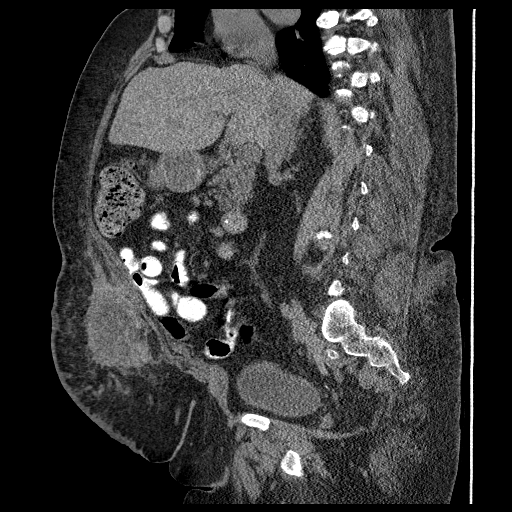

[13 of 36 positions shown; findings below may reference images not displayed]

FINDINGS: No focal abnormalities seen in the liver or spleen on
this study performed without intravenous contrast material.  The
stomach, duodenum, pancreas, gallbladder, and adrenal glands are
unremarkable.  There is renal cortical thinning bilaterally.  Right
renal cyst is stable.

No abdominal aortic aneurysm.  No free fluid or lymphadenopathy.

There is evidence of ventral mesh placement.  Towards the inferior
aspect of the mashed, a 8.5 x 3.6 x 8.8 cm fluid collection is
identified in the anterior abdominal wall, at the midline.  This is
superficial to the rectus fascia and has an irregular, thickened
rim.  Although IV contrast was not given for this exam, imaging
features raise concern for of an abscess.

Imaging through the pelvis shows no free intraperitoneal fluid.
The bladder is unremarkable.  Uterus is surgically absent.  There
is no adnexal mass.  No pelvic sidewall lymphadenopathy.  The
terminal ileum is normal. The appendix is not visualized, but there
is no edema or inflammation in the region of the cecum.

Bone windows reveal no worrisome lytic or sclerotic osseous
lesions.
IMPRESSION: 9 x 4 x 9 cm fluid collection in the midline anterior abdominal
wall, superficial to the rectus fascia, may well be an abscess.
This is at the location where the pigtail catheter was coiled on
the previous study.

## 2012-02-25 IMAGING — US US ABSCESS DRAINAGE W/ CATHETER
1 series · 8 of 8 positions shown · non-contrast
Comparison: none

CLINICAL DATA: Abdominal wall abscess, status post ventral hernia
repair

[Series 1: us abscess drainage w/ catheter · 0.26mm/px · 8 of 8 slices shown]
[im 1/8]
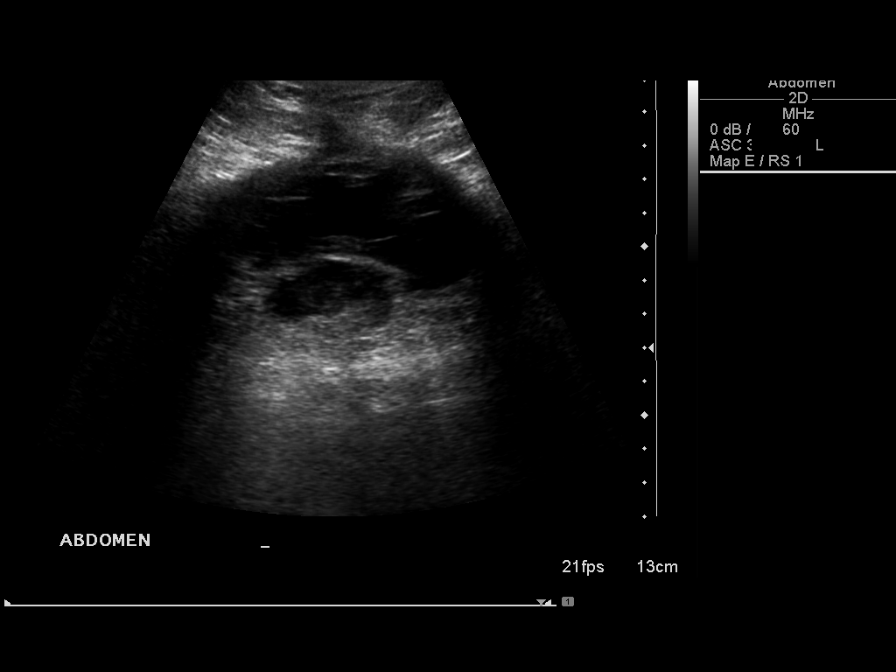
[im 2/8]
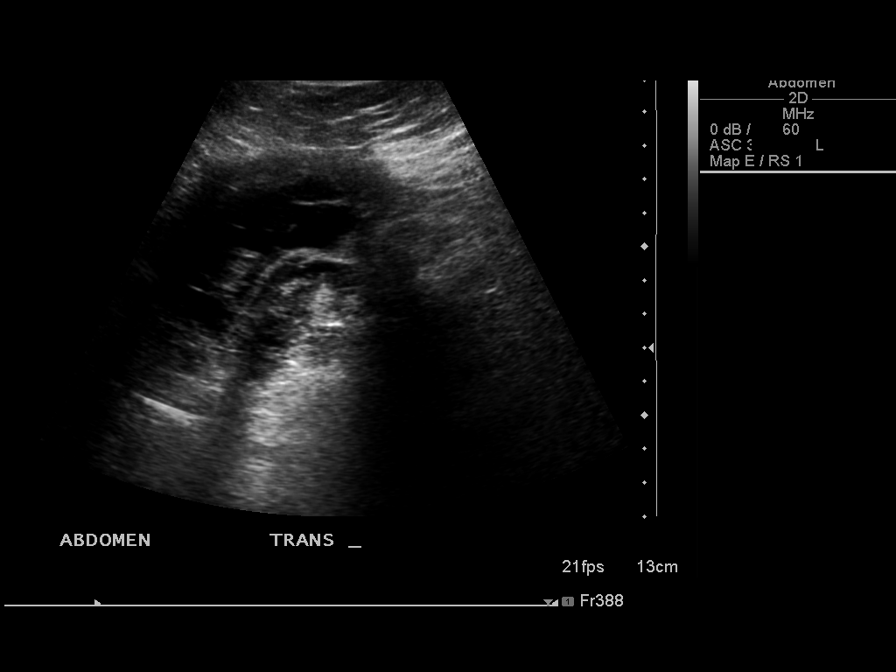
[im 3/8]
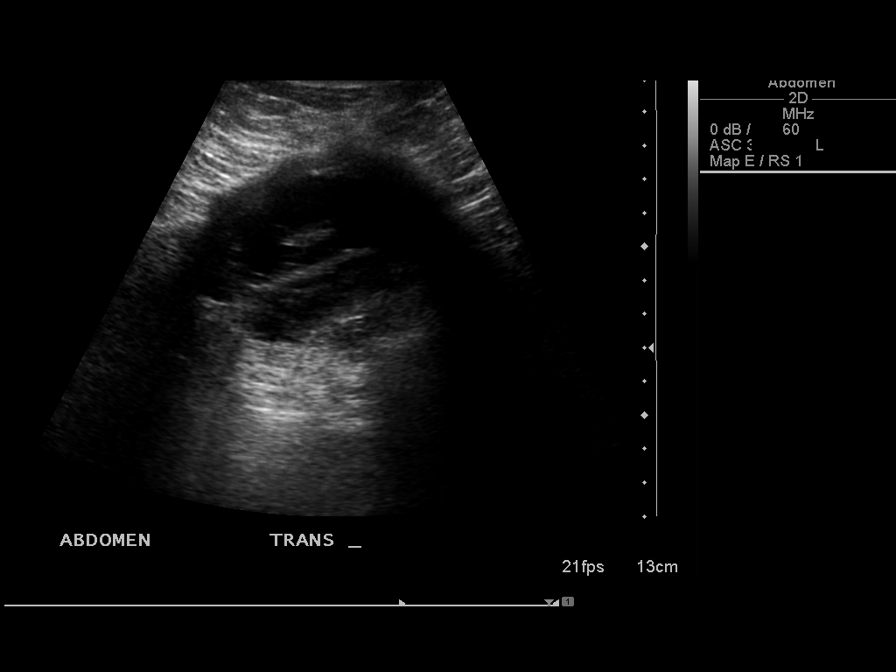
[im 4/8]
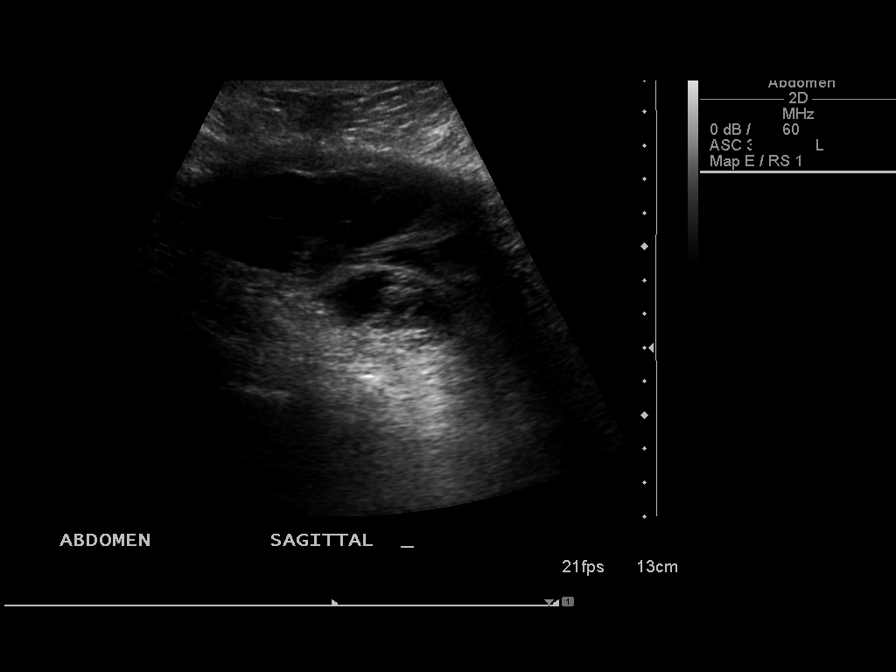
[im 5/8]
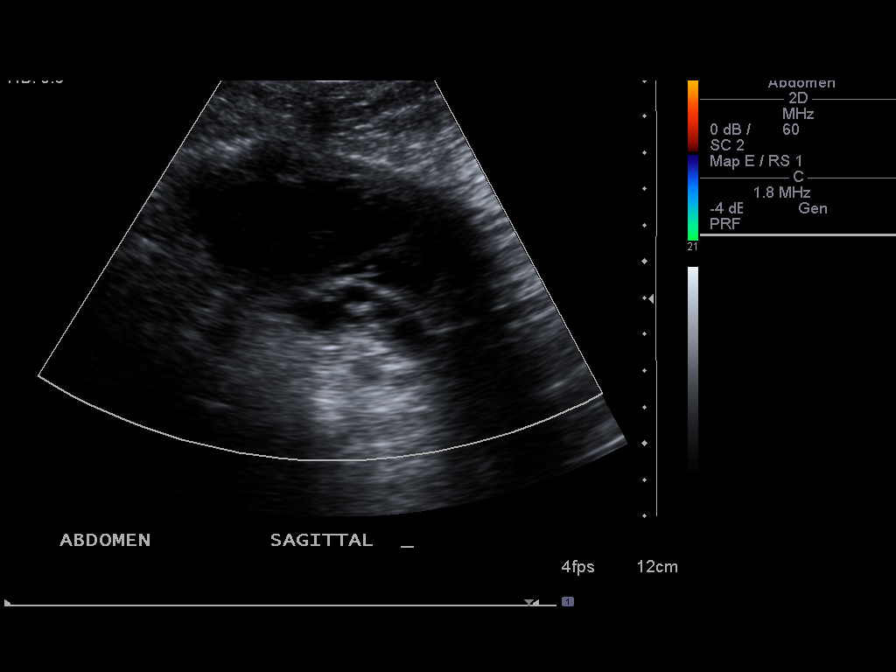
[im 6/8]
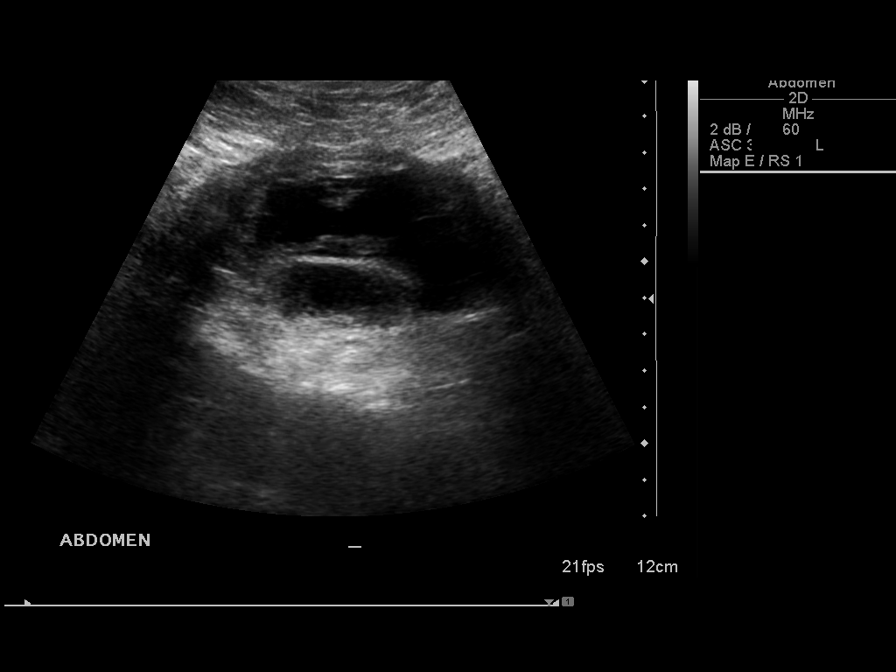
[im 7/8]
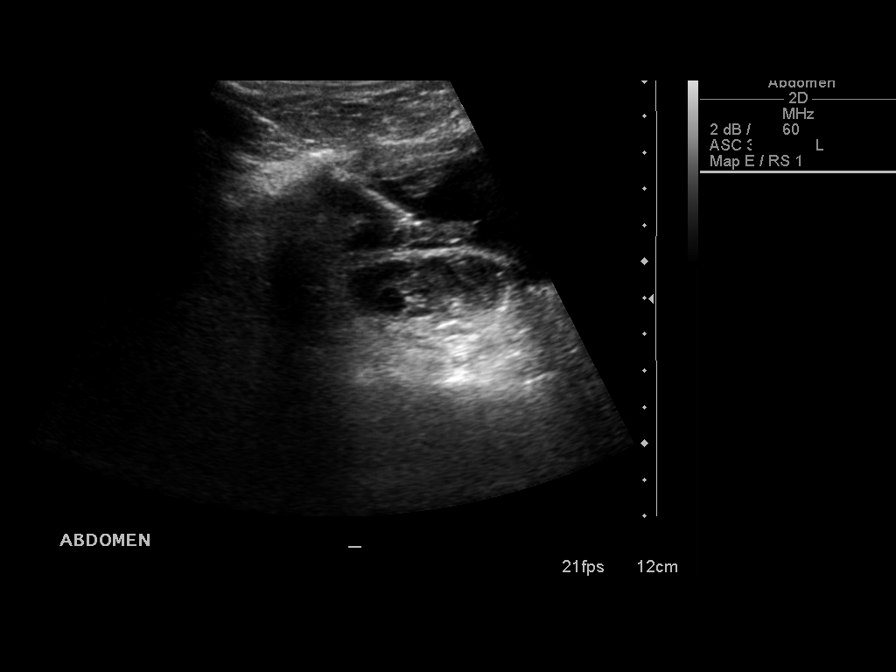
[im 8/8]
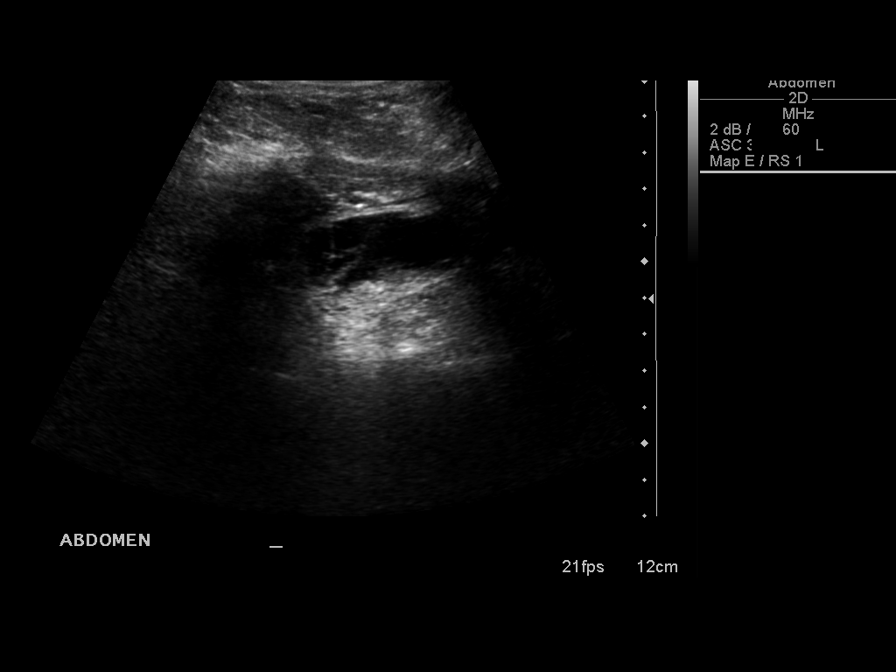

[8 of 8 positions shown; findings below may reference images not displayed]

ULTRASOUND ABDOMINAL WALL ABSCESS DRAIN

Date:  10/18/2010 [DATE]

Radiologist:  Sopuna Abilov, M.D.

Medications:  1% lidocaine locally

Guidance:  Ultrasound

Complications:  No immediate

PROCEDURE/FINDINGS:

Informed consent was obtained from the patient following
explanation of the procedure, risks, benefits and alternatives.
The patient understands, agrees and consents for the procedure.
All questions were addressed.  A time out was performed.

Maximal barrier sterile technique utilized including caps, mask,
sterile gowns, sterile gloves, large sterile drape, hand hygiene,
and betadine

Previous imaging was reviewed.  Preliminary ultrasound performed of
the abdominal wall in the midline.  Complex septated heterogeneous
fluid collection demonstrated with loculations.  Under sterile
conditions and local anesthesia, a 17 gauge 6.8 cm access needle
was advanced from a right lateral approach into the subcutaneous
fluid collection.  Needle position confirmed with ultrasound.
Images obtained for documentation.  Guide wire inserted followed by
tract dilatation to advance a 12-French drain.  Drain position
confirmed with ultrasound.  Syringe aspiration yielded 50 ml of
exudative bloody fluid.  Samples for Gram stain culture.  Catheter
secured with a Prolene suture and connected to external drainage.
Sterile dressing applied over the site.  No immediate complication.
The patient tolerated the procedure well.
IMPRESSION: Ultrasound abdominal wall abscess drain placement with removal of
50 ml bloody exudative fluid.  Sample sent for gram stain and
culture.

## 2012-02-26 ENCOUNTER — Encounter: Payer: Self-pay | Admitting: Vascular Surgery

## 2012-02-27 ENCOUNTER — Ambulatory Visit (INDEPENDENT_AMBULATORY_CARE_PROVIDER_SITE_OTHER): Payer: Medicare HMO | Admitting: Vascular Surgery

## 2012-02-27 ENCOUNTER — Encounter: Payer: Self-pay | Admitting: Vascular Surgery

## 2012-02-27 VITALS — BP 120/62 | HR 76 | Ht 65.0 in | Wt 264.0 lb

## 2012-02-27 DIAGNOSIS — M79609 Pain in unspecified limb: Secondary | ICD-10-CM

## 2012-02-27 DIAGNOSIS — M79603 Pain in arm, unspecified: Secondary | ICD-10-CM

## 2012-02-27 DIAGNOSIS — N186 End stage renal disease: Secondary | ICD-10-CM

## 2012-02-27 MED ORDER — PREGABALIN 25 MG PO CAPS: 50.0000 mg | ORAL_CAPSULE | Freq: Two times a day (BID) | ORAL | Status: DC

## 2012-02-27 NOTE — Progress Notes (Signed)
[facility] [place]   Postoperative Access Visit   History of Present Illness  [name] is a 67 y.o. year old female who presents for postoperative follow-up for: L 2nd stage BVT (Date: 01/28/12). The patient's wounds are healed. The patient has seen her PCP and was diagnosed with gout.  She is being medically treated at this point.  She continues to have aching pain in her left arm.  She does not think the narcotics are adequate.  Though she does not the Indocin helps her use her left hand.  She denies any neuropathy sx.  She has intact sensation in that hand.  She notes she previously was sent to a neurologist for a EMV/NCV to evaluate pain in the left arm.  She say those studies were non-conclusive.  Physical Examination   Filed Vitals:   02/27/12 1337  BP: 120/62  Pulse: 76  Height: 5\' 5"  (1.651 m)  Weight: 264 lb (119.75 kg)  SpO2: 98%   LUE: Incision is healed, skin feels warm including hand, hand grip is 5/5, sensation in digits is intact, palpable thrill, bruit can be auscultated , L hand and forearm swelling resolved  L arterial duplex (Date: 02/13/11)  - triphasic waveforms in both hands even without compression  - patient could not tolerate compression of the left arm   Medical Decision Making  [name] is a 67 y.o. year old female who presents s/p L 2nd stage BVT, gout, L arm pain  Pt does NOT have steal syndrome with warm hand triphasic waveforms in left radial and ulnar artery. The response to her gout medications and indocin also suggest some degree of contribution from that disease process The prior NCV/EMG also suggest some underlying issues with this left arm. As the L 2nd stage BVT is performed under open visualization of nervous structures and arterial structures, it is unlikely this patient's sx are a nerve injury related to the procedure. Regardless, I will start loading the patient on Lyrica to see if she has any response. I think  it is reasonable to start her L hand arm pain work up with a neurology consult.  Adele Barthel, MD  [facility] [place]  Office: [phone]  Pager: [phone]

## 2012-03-01 ENCOUNTER — Other Ambulatory Visit: Payer: Self-pay | Admitting: Neurosurgery

## 2012-03-12 ENCOUNTER — Other Ambulatory Visit: Payer: Self-pay | Admitting: Neurosurgery

## 2012-03-12 ENCOUNTER — Ambulatory Visit (INDEPENDENT_AMBULATORY_CARE_PROVIDER_SITE_OTHER): Payer: Medicare HMO | Admitting: General Surgery

## 2012-03-12 ENCOUNTER — Encounter (INDEPENDENT_AMBULATORY_CARE_PROVIDER_SITE_OTHER): Payer: Self-pay | Admitting: General Surgery

## 2012-03-12 ENCOUNTER — Telehealth: Payer: Self-pay

## 2012-03-12 VITALS — BP 146/76 | HR 74 | Resp 18 | Ht 65.0 in | Wt 261.0 lb

## 2012-03-12 DIAGNOSIS — R109 Unspecified abdominal pain: Secondary | ICD-10-CM

## 2012-03-12 MED ORDER — HYDROCODONE-ACETAMINOPHEN 5-325 MG PO TABS: ORAL_TABLET | ORAL | Status: DC

## 2012-03-12 NOTE — Telephone Encounter (Signed)
Pt. Called to report ongoing left arm pain, and rates at 6/10.  States the pain is persistent, and may leave briefly, then returns.  Denies fever or chills.  Denies any s/s of infection.  Awaiting referral to neurology.  Discussed w/ Dr. Bridgett Larsson.  States that pt. May have one refill of Hydrocodone/Acetaminophen, # 20; no refills.  Advised pt. Will call in her pain medication.  Stated she was notified that the neurology office we referred her to doesn't take her insurance.  Advised will have the schedulers look into a new referral to another Neurology office.

## 2012-03-15 ENCOUNTER — Ambulatory Visit: Payer: Medicare HMO | Admitting: Neurology

## 2012-03-18 ENCOUNTER — Encounter: Payer: Self-pay | Admitting: Vascular Surgery

## 2012-03-18 ENCOUNTER — Telehealth: Payer: Self-pay | Admitting: Vascular Surgery

## 2012-03-18 NOTE — Telephone Encounter (Addendum)
Dr Adele Barthel saw Ms [name] on 02/27/12. He would like to refer her to a neurologist regarding her Left Hand Pain.   03/01/12 a referral was faxed to [place] @ [facility].  03/03/12- spoke with Diane, she received the office note, but did not get a demographic sheet. Demo's faxed after conversation.  03/09/12- lm for Diane about status of referral. (Diane called back later and stated she never received the referral, so I refaxed it again, with demographics)  03/10/12- Left message for Diane- we have referred pt elsewhere due to needing an appt soon. Referral faxed to [facility]. Received call from Ivin Booty, appt was scheduled, then canceled due to the fact that they are not contracted with her insurance, they have left a message for patient.  03/10/12- Patient was scheduled with [facility] in [place] for 03/24/2012. However patient canceled this appointment because she does not wish to drive to [place]. I made patient aware that we have no choice but to refer her back to [facility] and that they may not be able to see her as quickly due to their patient load. She is ok with waiting.  03/12/12- Re-referred patient to [facility]- all information faxed again with explanation that patient is requesting their office.  03/18/12- Contacted [facility] regarding status of referral. According to their notes, they have left a message for the patient on 03/09/12 to schedule. I tired to reach the patient today to ask if she received their message. Patient did not answer- mailed letter asking patient to contact [facility] for appointment.  All information in documented in folder at front desk.  Edwinna Areola, [facility], [facility]

## 2012-03-21 NOTE — Progress Notes (Signed)
Subjective:     Patient ID: [name], female   DOB: [birth year], 67 y.o.   MRN: [id number]  HPI 1 yof now on dialysis who I know well from elap for incarcerated ventral hernia that she took a long time to recover from. She has abdominal wall open wound for a very long time that I had to debride in or.  This finally healed. She noted some pain in this area as well as one episode of drainage on her shirt but is otherwise fine.  She is having bms. No further drainage.  No pain, No fevers, no skin changes in this area.   Review of Systems     Objective:   Physical Exam Healed wound with both transverse and vertical component, no erythema, no fluctuance    Assessment:     S/p elap, healed abdominal wound     Plan:     We discussed observation vs scan.  She really is doing well right now with no abnormality on her exam so we both decided to follow for now.  If she has any recurrence of trouble then will call back.  I think she has some hypertrophic granulation tissue that was irritated.

## 2012-04-13 ENCOUNTER — Other Ambulatory Visit (HOSPITAL_COMMUNITY): Payer: Self-pay | Admitting: Nephrology

## 2012-04-13 DIAGNOSIS — N186 End stage renal disease: Secondary | ICD-10-CM

## 2012-04-15 ENCOUNTER — Telehealth: Payer: Self-pay | Admitting: *Deleted

## 2012-04-15 NOTE — Telephone Encounter (Signed)
DENIED REFILL REQUEST FOR HYDROCODONE/ACETAMINOPHEN .  PHARMACY TO REFER TO PCP OR NEUROLOGIST.

## 2012-04-16 ENCOUNTER — Other Ambulatory Visit (HOSPITAL_COMMUNITY): Payer: Self-pay | Admitting: Nephrology

## 2012-04-16 ENCOUNTER — Ambulatory Visit (HOSPITAL_COMMUNITY)
Admission: RE | Admit: 2012-04-16 | Discharge: 2012-04-16 | Disposition: A | Discharge status: Home / Self Care | Payer: Medicare HMO | Source: Ambulatory Visit | Attending: Nephrology | Admitting: Nephrology

## 2012-04-16 DIAGNOSIS — Z992 Dependence on renal dialysis: Secondary | ICD-10-CM | POA: Insufficient documentation

## 2012-04-16 DIAGNOSIS — N186 End stage renal disease: Secondary | ICD-10-CM

## 2012-04-16 DIAGNOSIS — Z4901 Encounter for fitting and adjustment of extracorporeal dialysis catheter: Secondary | ICD-10-CM | POA: Insufficient documentation

## 2012-04-16 NOTE — Procedures (Signed)
Successful removal of dialysis catheter.  No immediate complications.

## 2012-04-19 NOTE — Addendum Note (Signed)
Addendum created 04/19/12 2146 by Leda Quail, MD   Modules edited: Anesthesia Responsible Staff

## 2012-04-26 IMAGING — CR DG CHEST 2V
2 series · 2 of 2 positions shown · non-contrast
Comparison: 07/02/2010

CLINICAL DATA: Preoperative assessment, hypertension, diabetes,
former smoker

CHEST - 2 VIEW

[w chest pa]
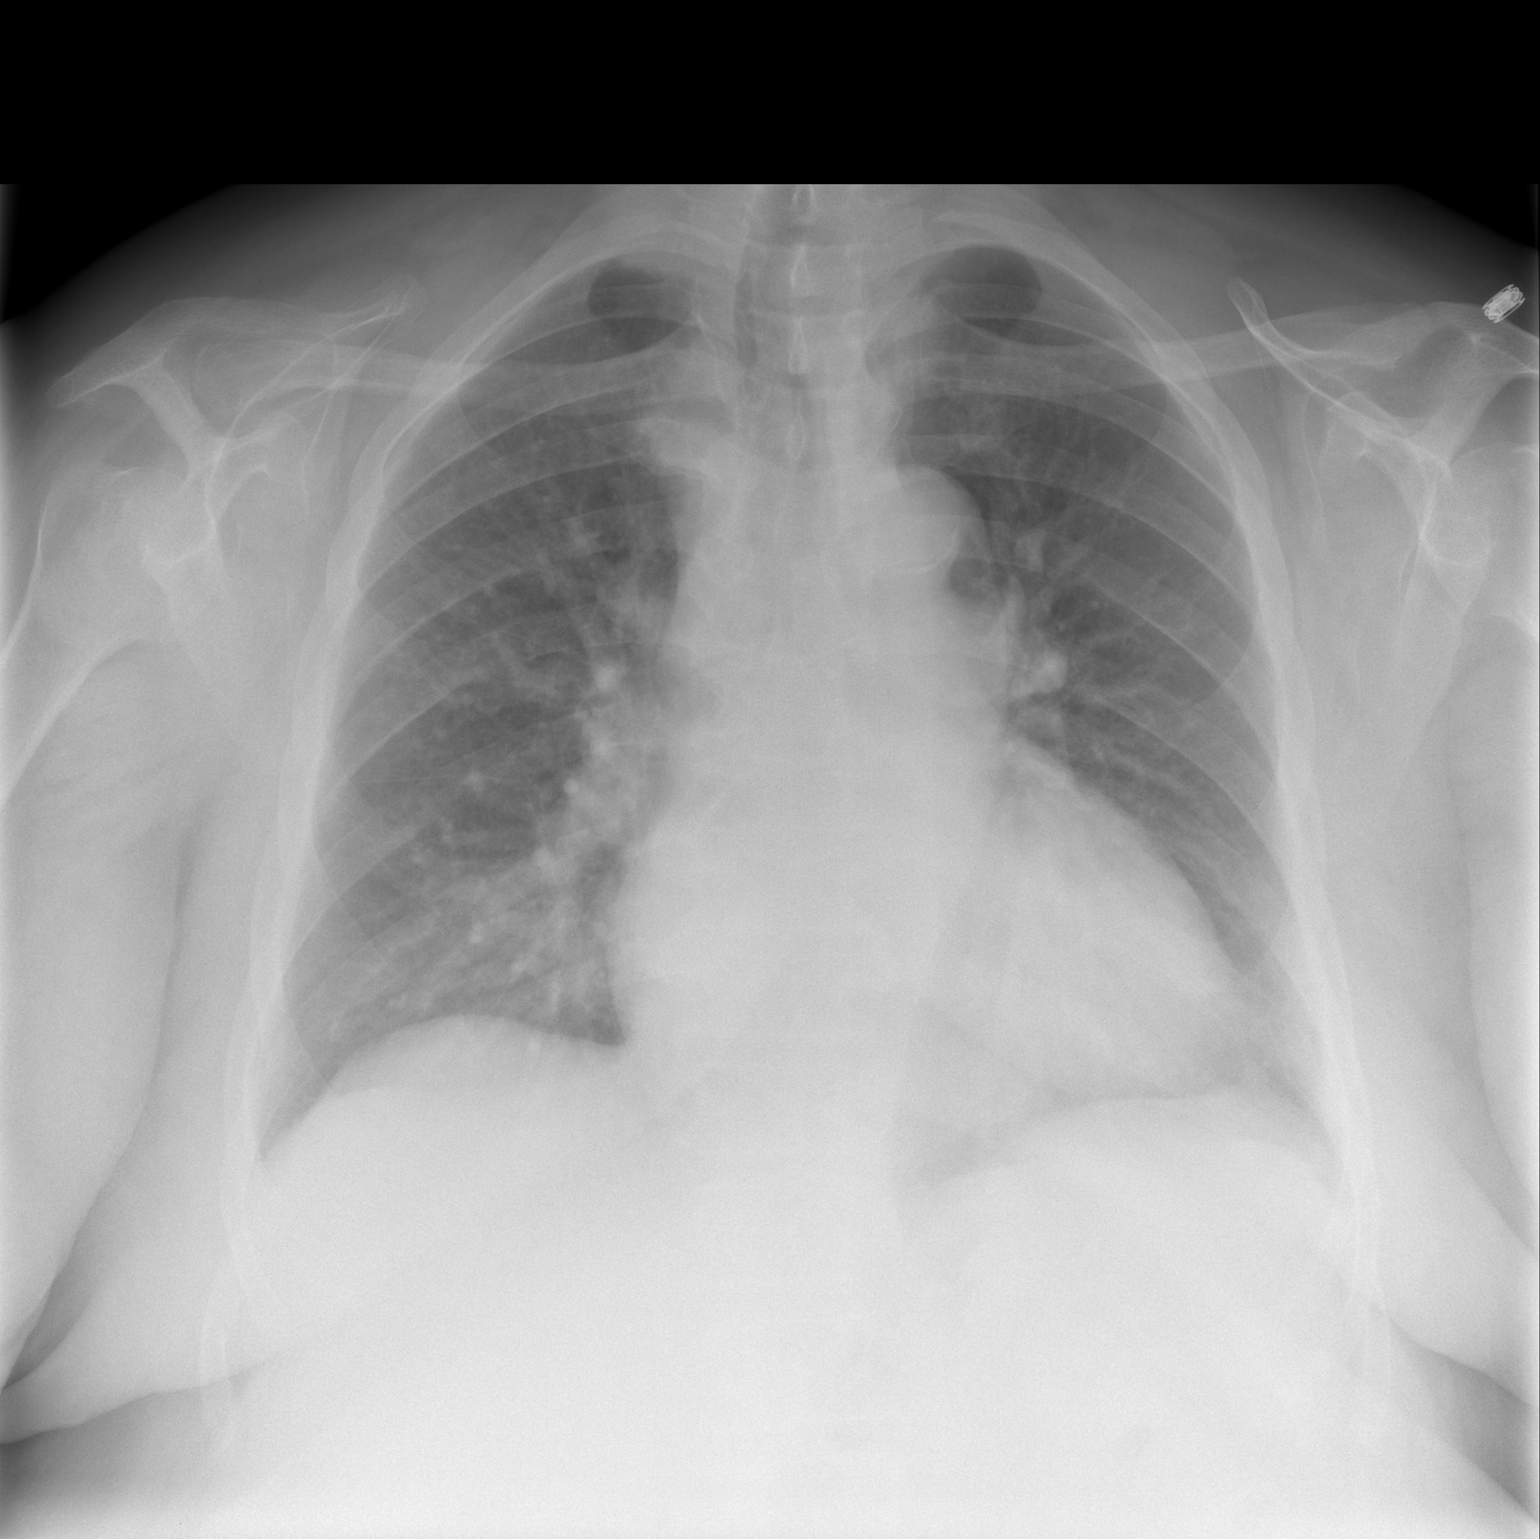

[w chest lat]
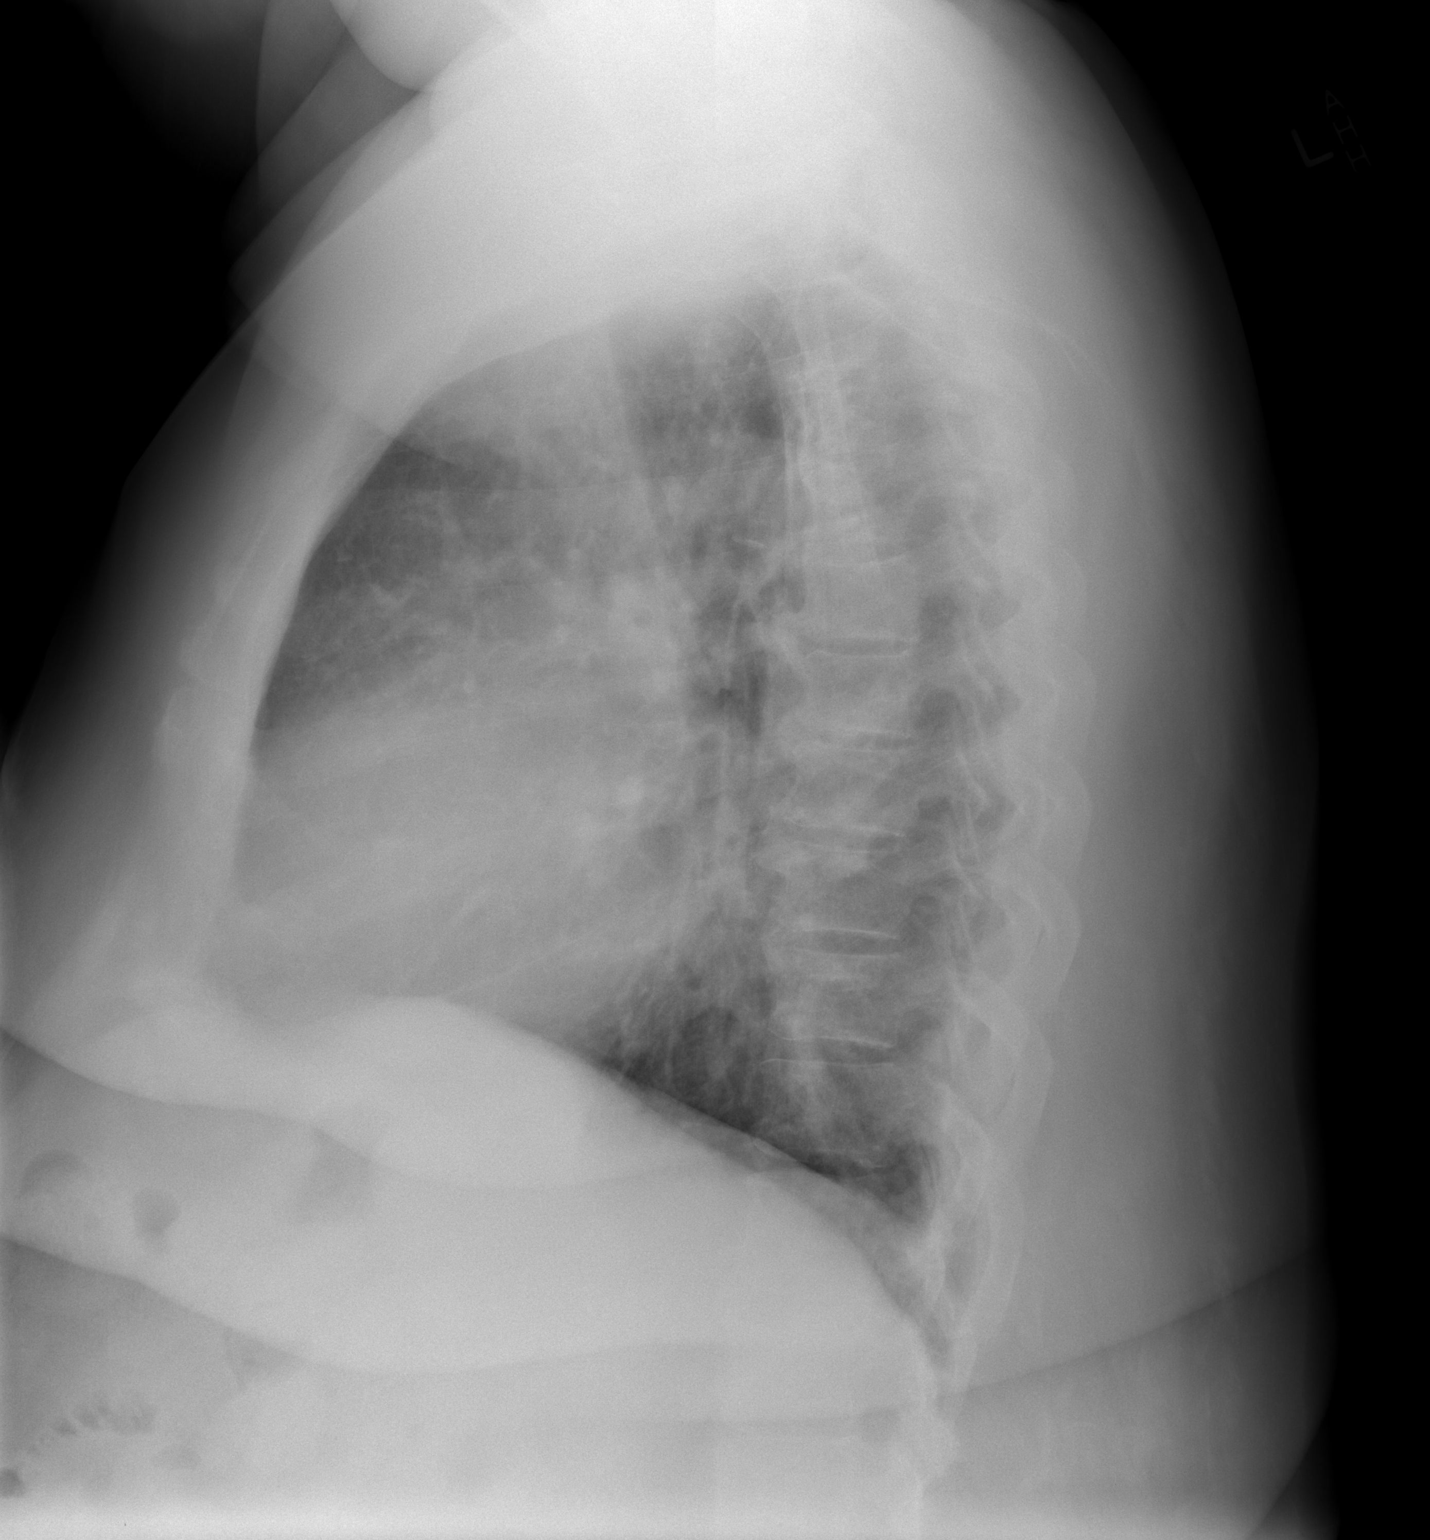

[2 of 2 positions shown; findings below may reference images not displayed]

FINDINGS: Enlargement of cardiac silhouette.
Calcified tortuous aorta.
Pulmonary vascularity normal.
Lungs clear.
No pleural effusion or pneumothorax.
Bones unremarkable.
IMPRESSION: Enlargement of cardiac silhouette.
No acute abnormalities.

## 2012-05-07 ENCOUNTER — Emergency Department (HOSPITAL_COMMUNITY): Payer: Medicare HMO

## 2012-05-07 ENCOUNTER — Encounter (HOSPITAL_COMMUNITY): Payer: Self-pay | Admitting: Emergency Medicine

## 2012-05-07 ENCOUNTER — Emergency Department (HOSPITAL_COMMUNITY)
Admission: EM | Admit: 2012-05-07 | Discharge: 2012-05-07 | Disposition: A | Discharge status: Home / Self Care | Payer: Medicare HMO | Attending: Emergency Medicine | Admitting: Emergency Medicine

## 2012-05-07 DIAGNOSIS — R58 Hemorrhage, not elsewhere classified: Secondary | ICD-10-CM

## 2012-05-07 DIAGNOSIS — K439 Ventral hernia without obstruction or gangrene: Secondary | ICD-10-CM

## 2012-05-07 DIAGNOSIS — G8918 Other acute postprocedural pain: Secondary | ICD-10-CM | POA: Insufficient documentation

## 2012-05-07 DIAGNOSIS — N186 End stage renal disease: Secondary | ICD-10-CM | POA: Insufficient documentation

## 2012-05-07 DIAGNOSIS — T82898A Other specified complication of vascular prosthetic devices, implants and grafts, initial encounter: Secondary | ICD-10-CM

## 2012-05-07 DIAGNOSIS — I2510 Atherosclerotic heart disease of native coronary artery without angina pectoris: Secondary | ICD-10-CM | POA: Insufficient documentation

## 2012-05-07 DIAGNOSIS — Z79899 Other long term (current) drug therapy: Secondary | ICD-10-CM | POA: Insufficient documentation

## 2012-05-07 DIAGNOSIS — R109 Unspecified abdominal pain: Secondary | ICD-10-CM | POA: Insufficient documentation

## 2012-05-07 DIAGNOSIS — E1029 Type 1 diabetes mellitus with other diabetic kidney complication: Secondary | ICD-10-CM | POA: Insufficient documentation

## 2012-05-07 DIAGNOSIS — I509 Heart failure, unspecified: Secondary | ICD-10-CM | POA: Insufficient documentation

## 2012-05-07 DIAGNOSIS — Z7982 Long term (current) use of aspirin: Secondary | ICD-10-CM | POA: Insufficient documentation

## 2012-05-07 DIAGNOSIS — I252 Old myocardial infarction: Secondary | ICD-10-CM | POA: Insufficient documentation

## 2012-05-07 DIAGNOSIS — Z8619 Personal history of other infectious and parasitic diseases: Secondary | ICD-10-CM | POA: Insufficient documentation

## 2012-05-07 DIAGNOSIS — I120 Hypertensive chronic kidney disease with stage 5 chronic kidney disease or end stage renal disease: Secondary | ICD-10-CM | POA: Insufficient documentation

## 2012-05-07 DIAGNOSIS — Y838 Other surgical procedures as the cause of abnormal reaction of the patient, or of later complication, without mention of misadventure at the time of the procedure: Secondary | ICD-10-CM | POA: Insufficient documentation

## 2012-05-07 DIAGNOSIS — Z8679 Personal history of other diseases of the circulatory system: Secondary | ICD-10-CM | POA: Insufficient documentation

## 2012-05-07 DIAGNOSIS — Z87891 Personal history of nicotine dependence: Secondary | ICD-10-CM | POA: Insufficient documentation

## 2012-05-07 DIAGNOSIS — Z794 Long term (current) use of insulin: Secondary | ICD-10-CM | POA: Insufficient documentation

## 2012-05-07 DIAGNOSIS — D649 Anemia, unspecified: Secondary | ICD-10-CM | POA: Insufficient documentation

## 2012-05-07 DIAGNOSIS — R112 Nausea with vomiting, unspecified: Secondary | ICD-10-CM | POA: Insufficient documentation

## 2012-05-07 DIAGNOSIS — R509 Fever, unspecified: Secondary | ICD-10-CM | POA: Insufficient documentation

## 2012-05-07 DIAGNOSIS — R5381 Other malaise: Secondary | ICD-10-CM | POA: Insufficient documentation

## 2012-05-07 DIAGNOSIS — Z8739 Personal history of other diseases of the musculoskeletal system and connective tissue: Secondary | ICD-10-CM | POA: Insufficient documentation

## 2012-05-07 DIAGNOSIS — Z9989 Dependence on other enabling machines and devices: Secondary | ICD-10-CM | POA: Insufficient documentation

## 2012-05-07 DIAGNOSIS — Z8669 Personal history of other diseases of the nervous system and sense organs: Secondary | ICD-10-CM | POA: Insufficient documentation

## 2012-05-07 DIAGNOSIS — Z872 Personal history of diseases of the skin and subcutaneous tissue: Secondary | ICD-10-CM | POA: Insufficient documentation

## 2012-05-07 DIAGNOSIS — G4730 Sleep apnea, unspecified: Secondary | ICD-10-CM | POA: Insufficient documentation

## 2012-05-07 LAB — CBC WITH DIFFERENTIAL/PLATELET
Basophils Absolute: 0 10*3/uL (ref 0.0–0.1)
HCT: 28 % — ABNORMAL LOW (ref 36.0–46.0)
Lymphocytes Relative: 22 % (ref 12–46)
Lymphs Abs: 1.8 10*3/uL (ref 0.7–4.0)
MCV: 85.9 fL (ref 78.0–100.0)
Monocytes Absolute: 0.9 10*3/uL (ref 0.1–1.0)
Neutro Abs: 5.6 10*3/uL (ref 1.7–7.7)
RBC: 3.26 MIL/uL — ABNORMAL LOW (ref 3.87–5.11)
RDW: 17.9 % — ABNORMAL HIGH (ref 11.5–15.5)
WBC: 8.4 10*3/uL (ref 4.0–10.5)

## 2012-05-07 LAB — TROPONIN I: Troponin I: <0.3 ng/mL (ref <0.30)

## 2012-05-07 LAB — HEPATIC FUNCTION PANEL
ALT: 51 U/L — ABNORMAL HIGH (ref 0–35)
AST: 47 U/L — ABNORMAL HIGH (ref 0–37)
Albumin: 3 g/dL — ABNORMAL LOW (ref 3.5–5.2)
Alkaline Phosphatase: 69 U/L (ref 39–117)
Bilirubin, Direct: 0.1 mg/dL (ref 0.0–0.3)
Total Bilirubin: 0.3 mg/dL (ref 0.3–1.2)

## 2012-05-07 LAB — BASIC METABOLIC PANEL
CO2: 32 mEq/L (ref 19–32)
Chloride: 98 mEq/L (ref 96–112)
Creatinine, Ser: 5.09 mg/dL — ABNORMAL HIGH (ref 0.50–1.10)
GFR calc Af Amer: 9 mL/min — ABNORMAL LOW (ref >90)
Potassium: 3.2 mEq/L — ABNORMAL LOW (ref 3.5–5.1)
Sodium: 140 mEq/L (ref 135–145)

## 2012-05-07 LAB — POCT I-STAT 3, VENOUS BLOOD GAS (G3P V)
Acid-Base Excess: 9 mmol/L — ABNORMAL HIGH (ref 0.0–2.0)
Bicarbonate: 32.3 mEq/L — ABNORMAL HIGH (ref 20.0–24.0)
O2 Saturation: 84 %
TCO2: 34 mmol/L (ref 0–100)
pCO2, Ven: 40.2 mmHg — ABNORMAL LOW (ref 45.0–50.0)
pO2, Ven: 44 mmHg (ref 30.0–45.0)

## 2012-05-07 MED ORDER — SODIUM CHLORIDE 0.9 % IV SOLN
Freq: Once | INTRAVENOUS | Status: AC
  Administered 2012-05-07: 06:00:00 via INTRAVENOUS

## 2012-05-07 MED ORDER — IOHEXOL 300 MG/ML  SOLN
50.0000 mL | Freq: Once | INTRAMUSCULAR | Status: AC | PRN
  Administered 2012-05-07: 50 mL via ORAL

## 2012-05-07 MED ORDER — ACETAMINOPHEN 325 MG PO TABS
650.0000 mg | ORAL_TABLET | Freq: Once | ORAL | Status: AC
  Administered 2012-05-07: 650 mg via ORAL
  Filled 2012-05-07: qty 2

## 2012-05-07 NOTE — ED Notes (Signed)
Pt remains in radiology since 0700.

## 2012-05-07 NOTE — ED Notes (Signed)
Changed radiology order to venous duplex per rad staff.-advised PA.

## 2012-05-07 NOTE — ED Notes (Signed)
PT. REPORTS BLEEDING AT DIALYSIS FISTULA AT HOME THIS EVENING , NO BLEEDING AT ARRIVAL , PRESSURE DRESSING APPLIED AT TRIAGE.

## 2012-05-07 NOTE — ED Notes (Signed)
Patient transported to Ultrasound 

## 2012-05-07 NOTE — ED Notes (Signed)
Pt arrive from radiology

## 2012-05-07 NOTE — ED Notes (Signed)
EDP Nanavati in with pt

## 2012-05-07 NOTE — Progress Notes (Addendum)
*  PRELIMINARY RESULTS* Vascular Ultrasound Duplex Dialysis Access (AVF, AGV) has been completed.   Left brachiobasilic AVF is patent. There is evidence of multiple fluid filled structures of the upper arm, possibly hematomas, with the largest being in the proximal upper arm measuring 1.98cm.  05/07/2012 12:09 PM Maudry Mayhew, RDMS, RDCS

## 2012-05-07 NOTE — ED Notes (Signed)
Spoke with dr Delane Ginger. Change contrast order to PO.

## 2012-05-07 NOTE — ED Provider Notes (Signed)
Pt tolerating PO's. Informed of CT results and need to f/u with gen surgery. Return for concerns  Julianne Rice, MD 05/07/12 1242

## 2012-05-07 NOTE — ED Provider Notes (Signed)
History     CSN: [id number]  Arrival date & time 05/07/12  0149   First MD Initiated Contact with Patient 05/07/12 [phone]      Chief Complaint  Patient presents with  . Vascular Access Problem    (Consider location/radiation/quality/duration/timing/severity/associated sxs/prior treatment) HPI Comments: Pt with hx of HTN, ESRD dialysis t/ts/sat via lue av fistula, iddm, and hx of abd surgery for SBO, hernia and subsequent complications of seroma and infection. Pt comes in with cc of vascular Access problem. She started having sudden onset blood squirting out from her fistula. RN placed pressure dressing, and the bleeding has ceased during my evaluation. Pt is not on anticoagulants. Pt also has some abd pain - at the side of the surgery. She also has fevers. Pt admits to some nausea, no emesis.   The history is provided by the patient.    Past Medical History  Diagnosis Date  . Arthritis   . Abdominal abscess 12-17-10    abdominal abscesses x2 ? one at this time  . Staph aureus infection November 2012   . Myocardial infarction 12-17-10    80's-abnormal testing showed  . Swelling of both ankles 12-17-10  . Blood transfusion, without reported diagnosis 12-17-10    "thinks in [facility]  . Chronic kidney disease   . Chronic renal insufficiency 2009    sees Dr. Florene Glen  every 4-5 mo  . Anemia   . Diabetes mellitus     type 2 IDDM x 6-7 years  . Coronary artery disease     Dr Kadakia-cardiologist 2-3 x year  . CHF (congestive heart failure)   . Diabetes mellitus   . Dysrhythmia     irregular, "skips beats"  . Shortness of breath   . Sleep apnea 2012    CPAP  . Hypertension     sees Dr. Willey Blade  . Neuromuscular disorder     diabetic neuropathy  . Blind right eye     fell and crushed socket and eye fell out, was replaced at [facility]    Past Surgical History  Procedure Laterality Date  . Ventral hernia repair      component separation, repair with  biologic  . Vaginal hysterectomy    . Cardiac catheterization  12-17-10    '80's  . Abdominal hysterectomy  12-17-10    Vaginal Hysterectomy  . Cataract extraction w/ intraocular lens implant  12-17-10    bilateral  . Laparotomy  12/18/2010    Procedure: EXPLORATORY LAPAROTOMY;  Surgeon: Rolm Bookbinder, MD;  Location: [facility];  Service: General;  Laterality: N/A;  Abdominal Seroma Evacuation  . Back surgery    . Lumbar disc surgery  1970'-80's    X 3  . Wound debridement  03/20/2011    Procedure: DEBRIDEMENT ABDOMINAL WOUND;  Surgeon: Rolm Bookbinder, MD;  Location: [place];  Service: General;  Laterality: N/A;  debridement abdominal wall, placement of wound vac  . Abdominal hysterectomy    . Cataract extraction, bilateral    . Av fistula placement  09/30/2011    Procedure: ARTERIOVENOUS (AV) FISTULA CREATION;  Surgeon: Conrad , MD;  Location: [place];  Service: Vascular;  Laterality: Left;  BRACHIAL-CEPHALIC  . Bascilic vein transposition  11/10/2011    Left arm   . Insertion of dialysis catheter  01/05/2012    Procedure: INSERTION OF DIALYSIS CATHETER;  Surgeon: Conrad , MD;  Location: [place];  Service: Vascular;  Laterality: N/A;  Right Internal Jugular Placement  .  Eye surgery      Eye replaced after fall  . Bascilic vein transposition  01/28/2012    Procedure: BASCILIC VEIN TRANSPOSITION;  Surgeon: Conrad , MD;  Location: [facility] OR;  Service: Vascular;  Laterality: Left;  Second Stage     Family History  Problem Relation Age of Onset  . Hypertension Mother   . Cancer Brother     spine  . Cancer Sister     brain  . Hypertension Maternal Grandmother   . Anesthesia problems Neg Hx   . Hypertension Father     History  Substance Use Topics  . Smoking status: Former Smoker    Types: Cigarettes    Quit date: 12/16/1980  . Smokeless tobacco: Never Used  . Alcohol Use: No    OB History   Grav Para Term Preterm Abortions TAB SAB Ect Mult Living                   Review of Systems  Constitutional: Positive for fever and fatigue. Negative for activity change.  HENT: Negative for facial swelling and neck pain.   Respiratory: Negative for cough, shortness of breath and wheezing.   Cardiovascular: Negative for chest pain.  Gastrointestinal: Positive for nausea, vomiting and abdominal pain. Negative for diarrhea, constipation, blood in stool and abdominal distention.  Genitourinary: Negative for hematuria.  Skin: Negative for color change.  Neurological: Negative for speech difficulty and headaches.  Hematological: Does not bruise/bleed easily.  Psychiatric/Behavioral: Negative for confusion.    Allergies  Review of patient's allergies indicates no known allergies.  Home Medications   Current Outpatient Rx  Name  Route  Sig  Dispense  Refill  . acetaminophen (TYLENOL) 500 MG tablet   Oral   Take 500 mg by mouth every 6 (six) hours as needed. For pain         . amLODipine (NORVASC) 10 MG tablet   Oral   Take 10 mg by mouth daily.          Marland Kitchen aspirin EC 81 MG tablet   Oral   Take 81 mg by mouth daily.           . bisacodyl (DULCOLAX) 5 MG EC tablet   Oral   Take 5 mg by mouth daily as needed. For constipation         . Blood Glucose Calibration (BAYER BREEZE 2 CONTROL) NORMAL LIQD               . Blood Glucose Calibration (TAI DOC CONTROL) NORMAL SOLN               . Blood Glucose Monitoring Suppl (CLEVER CHEK AUTO-CODE VOICE) DEVI               . Blood Glucose Monitoring Suppl (FORA V12 BLOOD GLUCOSE SYSTEM) DEVI               . Calcium Carbonate-Vitamin D (CALCIUM 600 + D PO)   Oral   Take 1 tablet by mouth daily.          . carvedilol (COREG) 25 MG tablet   Oral   Take 25 mg by mouth 2 (two) times daily with a meal.         . CLEVER CHEK AUTO-CODE VOICE test strip               . ferrous sulfate 325 (65 FE) MG tablet   Oral   Take 325 mg by mouth  daily with breakfast.           .  hydrALAZINE (APRESOLINE) 50 MG tablet   Oral   Take 50 mg by mouth 3 (three) times daily.         Marland Kitchen HYDROcodone-acetaminophen (NORCO/VICODIN) 5-325 MG per tablet   Oral   Take 1-2 tablets by mouth every 6 (six) hours as needed for pain.         . indomethacin (INDOCIN) 50 MG capsule   Oral   Take 1 capsule (50 mg total) by mouth 2 (two) times daily with a meal.   60 capsule   0   . insulin glargine (LANTUS) 100 UNIT/ML injection   Subcutaneous   Inject 5 Units into the skin at bedtime. SSI         . insulin lispro (HUMALOG) 100 UNIT/ML injection   Subcutaneous   Inject 5 Units into the skin 3 (three) times daily before meals.          . isosorbide mononitrate (IMDUR) 60 MG 24 hr tablet   Oral   Take 60 mg by mouth daily.          Elmore Guise Devices (LANCING DEVICE) MISC               . Lite Touch Lancets MISC               . metoprolol succinate (TOPROL-XL) 50 MG 24 hr tablet   Oral   Take 50 mg by mouth daily.          . Multiple Vitamin (MULITIVITAMIN WITH MINERALS) TABS   Oral   Take 1 tablet by mouth daily.         Marland Kitchen NITROSTAT 0.4 MG SL tablet   Oral   Take 0.4 mg by mouth every 5 (five) minutes as needed. For chest pain         . Pharmacist Choice Lancets MISC               . rosuvastatin (CRESTOR) 20 MG tablet   Oral   Take 20 mg by mouth daily.         . sevelamer carbonate (RENVELA) 800 MG tablet   Oral   Take 2,400 mg by mouth 3 (three) times daily with meals.           BP 103/29  Pulse 79  Temp(Src) 100.1 F (37.8 C) (Oral)  Resp 20  SpO2 96%  Physical Exam  Nursing note and vitals reviewed. Constitutional: She is oriented to person, place, and time. She appears well-developed and well-nourished.  HENT:  Head: Normocephalic and atraumatic.  Eyes: EOM are normal. Pupils are equal, round, and reactive to light.  Neck: Neck supple.  Cardiovascular: Normal rate, regular rhythm and normal heart sounds.   No murmur  heard. Pulmonary/Chest: Effort normal. No respiratory distress.  Abdominal: Soft. She exhibits no distension. There is tenderness. There is guarding. There is no rebound.  Diffuse tenderness, but worst around the umbilicus and around the wound site.  Musculoskeletal:  LUE - av fistula - good thrill around the av fistula - however, at the site of the bleed, there is decreased thrill appreciated, and some induration.  Neurological: She is alert and oriented to person, place, and time.  Skin: Skin is warm and dry.    ED Course  Procedures (including critical care time)  Labs Reviewed  BASIC METABOLIC PANEL - Abnormal; Notable for the following:    Potassium 3.2 (*)  Glucose, Bld 115 (*)    BUN 28 (*)    Creatinine, Ser 5.09 (*)    GFR calc non Af Amer 8 (*)    GFR calc Af Amer 9 (*)    All other components within normal limits  CBC WITH DIFFERENTIAL - Abnormal; Notable for the following:    RBC 3.26 (*)    Hemoglobin 8.8 (*)    HCT 28.0 (*)    RDW 17.9 (*)    All other components within normal limits  HEPATIC FUNCTION PANEL - Abnormal; Notable for the following:    Albumin 3.0 (*)    AST 47 (*)    ALT 51 (*)    Indirect Bilirubin 0.2 (*)    All other components within normal limits  PROTIME-INR - Abnormal; Notable for the following:    Prothrombin Time 15.4 (*)    All other components within normal limits  APTT - Abnormal; Notable for the following:    aPTT 48 (*)    All other components within normal limits  POCT I-STAT 3, BLOOD GAS (G3P V) - Abnormal; Notable for the following:    pH, Ven 7.513 (*)    pCO2, Ven 40.2 (*)    Bicarbonate 32.3 (*)    Acid-Base Excess 9.0 (*)    All other components within normal limits  CULTURE, BLOOD (ROUTINE X 2)  CULTURE, BLOOD (ROUTINE X 2)  TROPONIN I  CG4 I-STAT (LACTIC ACID)   Ct Abdomen Pelvis Wo Contrast  05/07/2012  *RADIOLOGY REPORT*  Clinical Data: Spontaneous bleeding at dialysis fistula PICC line site. Recent ventral  hernia repair.  Evaluate for retroperitoneal hemorrhage. End-stage renal disease.  CT ABDOMEN AND PELVIS WITHOUT CONTRAST  Technique:  Multidetector CT imaging of the abdomen and pelvis was performed following the standard protocol without intravenous contrast.  Comparison: 05/20/2011  Findings: Postop changes are seen from ventral hernia repair with further healing of surgical wound.  A small residual ventral hernia containing several small bowel loops is seen along the inferior aspect of the surgical mesh.  No evidence of bowel obstruction. There is no evidence of abdominal wall or retroperitoneal hematoma. No evidence of hemoperitoneum or free air.  A small hiatal hernia is again seen.  Noncontrast images of the liver, gallbladder, pancreas, spleen, and adrenal glands are normal appearance.  Small renal cyst appears stable and there is no evidence of renal calculi or hydronephrosis.  Prior hysterectomy noted.  No soft tissue masses or lymphadenopathy identified.  No evidence of acute inflammatory process or abscess.  IMPRESSION:  1.  No acute findings. 2.  Small residual ventral hernia containing small bowel loops along the inferior margin of the surgical mesh. 3.  Small hiatal hernia.   Original Report Authenticated By: Earle Gell, M.D.    Dg Chest Port 1 View  05/07/2012  *RADIOLOGY REPORT*  Clinical Data: Vomiting; bleeding from PICC site.  PORTABLE CHEST - 1 VIEW  Comparison: Chest radiograph performed 01/05/2012  Findings: The lungs are relatively well expanded.  Mild vascular congestion is seen.  Mild bibasilar opacities could reflect minimal interstitial edema.  No pleural effusion or pneumothorax is seen.  The cardiomediastinal silhouette is borderline enlarged. Calcification is noted within the aortic arch.  No acute osseous abnormalities are identified.  IMPRESSION: Mild vascular congestion and borderline cardiomegaly; mild bibasilar opacities could reflect minimal interstitial edema, though much  less prominent than on the prior study.   Original Report Authenticated By: Santa Lighter, M.D.  No diagnosis found.    MDM  Pt with dialysis comes in with cc of vascular access problem. There appeared to have arterial bleed, which has ceased.  Will get US vascular to ensure there is no aneurysm and significant clot burden that is affecting the flow.  Pt also has diffuse abd pain around the surgical site. She has a fever, and is immunocompromised due to her diabetes and gets frequent vascular exposure due to dialysis, so we will get cultures and get CT abd.   Varney Biles, MD 05/07/12 [phone]

## 2012-05-10 ENCOUNTER — Telehealth (INDEPENDENT_AMBULATORY_CARE_PROVIDER_SITE_OTHER): Payer: Self-pay | Admitting: *Deleted

## 2012-05-10 ENCOUNTER — Encounter (INDEPENDENT_AMBULATORY_CARE_PROVIDER_SITE_OTHER): Payer: Self-pay

## 2012-05-10 NOTE — Telephone Encounter (Signed)
Patient called this morning asking multiple questions.  Patient states she has been having abdominal pain so she was seen in the ED.  Patient states that while there they did a test that "scanned her whole body".  Patient states the ED doctor told her she has a ventral hernia and she needs to contact her surgeon that he didn't do his job to repair it the last time.  Patient states she doesn't know why she had surgery if Dr. Donne Hazel didn't fix the hernia.  Explained to patient that this new hernia is positioned below the bottom of the mesh that was placed during the hernia repair performed by Dr. Donne Hazel.  Explained to patient that Dr. Donne Hazel did perform the hernia repair as he said he had.  Patient is ok at this time hearing this information.  Patient is now asking what she needs to do to get this new ventral hernia repaired so she can get the abdominal pain under control.  Established patient appt being made for patient at this time.

## 2012-05-11 ENCOUNTER — Other Ambulatory Visit (HOSPITAL_COMMUNITY): Payer: Self-pay | Admitting: Nephrology

## 2012-05-11 ENCOUNTER — Telehealth (INDEPENDENT_AMBULATORY_CARE_PROVIDER_SITE_OTHER): Payer: Self-pay

## 2012-05-11 DIAGNOSIS — N186 End stage renal disease: Secondary | ICD-10-CM

## 2012-05-11 NOTE — Telephone Encounter (Signed)
Called pt but no answer. I just wanted to let her know that Dr Donne Hazel and I did get her message she left yesterday. I did confirm with Dr Donne Hazel about her appt scheduled for May if this was ok and he said it was fine unless if she started having more problems then she should call our office.

## 2012-05-12 ENCOUNTER — Other Ambulatory Visit (HOSPITAL_COMMUNITY): Payer: Self-pay | Admitting: Nephrology

## 2012-05-12 ENCOUNTER — Ambulatory Visit (HOSPITAL_COMMUNITY)
Admission: RE | Admit: 2012-05-12 | Discharge: 2012-05-12 | Disposition: A | Discharge status: Home / Self Care | Payer: Medicare HMO | Source: Ambulatory Visit | Attending: Nephrology | Admitting: Nephrology

## 2012-05-12 DIAGNOSIS — I252 Old myocardial infarction: Secondary | ICD-10-CM | POA: Insufficient documentation

## 2012-05-12 DIAGNOSIS — D649 Anemia, unspecified: Secondary | ICD-10-CM | POA: Insufficient documentation

## 2012-05-12 DIAGNOSIS — N186 End stage renal disease: Secondary | ICD-10-CM

## 2012-05-12 DIAGNOSIS — H543 Unqualified visual loss, both eyes: Secondary | ICD-10-CM | POA: Insufficient documentation

## 2012-05-12 DIAGNOSIS — I82609 Acute embolism and thrombosis of unspecified veins of unspecified upper extremity: Secondary | ICD-10-CM | POA: Insufficient documentation

## 2012-05-12 DIAGNOSIS — Z87891 Personal history of nicotine dependence: Secondary | ICD-10-CM | POA: Insufficient documentation

## 2012-05-12 DIAGNOSIS — I2510 Atherosclerotic heart disease of native coronary artery without angina pectoris: Secondary | ICD-10-CM | POA: Insufficient documentation

## 2012-05-12 DIAGNOSIS — I999 Unspecified disorder of circulatory system: Secondary | ICD-10-CM | POA: Insufficient documentation

## 2012-05-12 DIAGNOSIS — I509 Heart failure, unspecified: Secondary | ICD-10-CM | POA: Insufficient documentation

## 2012-05-12 DIAGNOSIS — E1149 Type 2 diabetes mellitus with other diabetic neurological complication: Secondary | ICD-10-CM | POA: Insufficient documentation

## 2012-05-12 DIAGNOSIS — Z8619 Personal history of other infectious and parasitic diseases: Secondary | ICD-10-CM | POA: Insufficient documentation

## 2012-05-12 DIAGNOSIS — G4730 Sleep apnea, unspecified: Secondary | ICD-10-CM | POA: Insufficient documentation

## 2012-05-12 DIAGNOSIS — I120 Hypertensive chronic kidney disease with stage 5 chronic kidney disease or end stage renal disease: Secondary | ICD-10-CM | POA: Insufficient documentation

## 2012-05-12 DIAGNOSIS — Y832 Surgical operation with anastomosis, bypass or graft as the cause of abnormal reaction of the patient, or of later complication, without mention of misadventure at the time of the procedure: Secondary | ICD-10-CM | POA: Insufficient documentation

## 2012-05-12 DIAGNOSIS — E1142 Type 2 diabetes mellitus with diabetic polyneuropathy: Secondary | ICD-10-CM | POA: Insufficient documentation

## 2012-05-12 MED ORDER — CEFAZOLIN SODIUM-DEXTROSE 2-3 GM-% IV SOLR: 2.0000 g | Freq: Once | INTRAVENOUS | Status: AC

## 2012-05-12 MED ORDER — MIDAZOLAM HCL 2 MG/2ML IJ SOLN
INTRAMUSCULAR | Status: AC | PRN
  Administered 2012-05-12: 1 mg via INTRAVENOUS

## 2012-05-12 MED ORDER — FENTANYL CITRATE 0.05 MG/ML IJ SOLN
INTRAMUSCULAR | Status: DC
Start: 2012-05-12 — End: 2012-05-13
  Filled 2012-05-12: qty 4

## 2012-05-12 MED ORDER — CEFAZOLIN SODIUM-DEXTROSE 2-3 GM-% IV SOLR
INTRAVENOUS | Status: AC
  Administered 2012-05-12: 2 g via INTRAVENOUS
  Filled 2012-05-12: qty 50

## 2012-05-12 MED ORDER — IOHEXOL 300 MG/ML  SOLN
100.0000 mL | Freq: Once | INTRAMUSCULAR | Status: AC | PRN
  Administered 2012-05-12: 30 mL via INTRAVENOUS

## 2012-05-12 MED ORDER — HEPARIN SODIUM (PORCINE) 1000 UNIT/ML IJ SOLN
INTRAMUSCULAR | Status: AC
  Filled 2012-05-12: qty 1

## 2012-05-12 MED ORDER — FENTANYL CITRATE 0.05 MG/ML IJ SOLN
INTRAMUSCULAR | Status: AC | PRN
  Administered 2012-05-12: 50 ug via INTRAVENOUS

## 2012-05-12 MED ORDER — MIDAZOLAM HCL 2 MG/2ML IJ SOLN
INTRAMUSCULAR | Status: AC
  Filled 2012-05-12: qty 4

## 2012-05-12 NOTE — H&P (Signed)
Interventional Radiology Pre Procedure H&P  Reason for Consult: Possible left arm fistula thrombosis Referring Physician: Moshe Cipro   HPI: [name] is an 67 y.o. female  With ESRD on HD via a left arm AV fistula.  The fistula was created in Dec 2013 and has been in use since January.  She has had difficult accesses with poor flows and pulling of clots at her recent dialysis sessions.  US exam shows a patent fistula with extensive adjacent hematoma and thrombosed pseudoaneurysms.  Fistulogram also shows no significant stenosis.    Past Medical History:  Past Medical History  Diagnosis Date  . Arthritis   . Abdominal abscess 12-17-10    abdominal abscesses x2 ? one at this time  . Staph aureus infection November 2012   . Myocardial infarction 12-17-10    80's-abnormal testing showed  . Swelling of both ankles 12-17-10  . Blood transfusion, without reported diagnosis 12-17-10    "thinks in [facility]  . Chronic kidney disease   . Chronic renal insufficiency 2009    sees Dr. Florene Glen  every 4-5 mo  . Anemia   . Diabetes mellitus     type 2 IDDM x 6-7 years  . Coronary artery disease     Dr Kadakia-cardiologist 2-3 x year  . CHF (congestive heart failure)   . Diabetes mellitus   . Dysrhythmia     irregular, "skips beats"  . Shortness of breath   . Sleep apnea 2012    CPAP  . Hypertension     sees Dr. Willey Blade  . Neuromuscular disorder     diabetic neuropathy  . Blind right eye     fell and crushed socket and eye fell out, was replaced at [facility]    Surgical History:  Past Surgical History  Procedure Laterality Date  . Ventral hernia repair      component separation, repair with biologic  . Vaginal hysterectomy    . Cardiac catheterization  12-17-10    '80's  . Abdominal hysterectomy  12-17-10    Vaginal Hysterectomy  . Cataract extraction w/ intraocular lens implant  12-17-10    bilateral  . Laparotomy  12/18/2010    Procedure: EXPLORATORY  LAPAROTOMY;  Surgeon: Rolm Bookbinder, MD;  Location: [facility];  Service: General;  Laterality: N/A;  Abdominal Seroma Evacuation  . Back surgery    . Lumbar disc surgery  1970'-80's    X 3  . Wound debridement  03/20/2011    Procedure: DEBRIDEMENT ABDOMINAL WOUND;  Surgeon: Rolm Bookbinder, MD;  Location: [place];  Service: General;  Laterality: N/A;  debridement abdominal wall, placement of wound vac  . Abdominal hysterectomy    . Cataract extraction, bilateral    . Av fistula placement  09/30/2011    Procedure: ARTERIOVENOUS (AV) FISTULA CREATION;  Surgeon: Conrad [place]on, MD;  Location: [place];  Service: Vascular;  Laterality: Left;  BRACHIAL-CEPHALIC  . Bascilic vein transposition  11/10/2011    Left arm   . Insertion of dialysis catheter  01/05/2012    Procedure: INSERTION OF DIALYSIS CATHETER;  Surgeon: Conrad [place], MD;  Location: [place];  Service: Vascular;  Laterality: N/A;  Right Internal Jugular Placement  . Eye surgery      Eye replaced after fall  . Bascilic vein transposition  01/28/2012    Procedure: BASCILIC VEIN TRANSPOSITION;  Surgeon: Conrad Severance, MD;  Location: [place];  Service: Vascular;  Laterality: Left;  Second Stage  Family History:  Family History  Problem Relation Age of Onset  . Hypertension Mother   . Cancer Brother     spine  . Cancer Sister     brain  . Hypertension Maternal Grandmother   . Anesthesia problems Neg Hx   . Hypertension Father     Social History:  reports that she quit smoking about 31 years ago. Her smoking use included Cigarettes. She smoked 0.00 packs per day. She has never used smokeless tobacco. She reports that she does not drink alcohol or use illicit drugs.  Allergies: No Known Allergies  Medications: I have reviewed the patient's current medications.  ROS: See HPI for pertinent findings, otherwise complete 10 system review negative.  Physical Exam: There were no vitals taken for this visit.  Cardiac: RRR Pulmonary: CTA  B Airway: Malampati 2   Labs: CBC No results found for this basename: WBC, HGB, HCT, PLT,  in the last 72 hours MET No results found for this basename: NA, K, CL, CO2, GLUCOSE, BUN, CREATININE, CALCIUM,  in the last 72 hours No results found for this basename: PROT, ALBUMIN, AST, ALT, ALKPHOS, BILITOT, BILIDIR, IBILI, LIPASE,  in the last 72 hours PT/INR No results found for this basename: LABPROT, INR,  in the last 72 hours ABG No results found for this basename: PHART, PCO2, PO2, HCO3,  in the last 72 hours    No results found.  Assessment/Plan: Widely patent but deep left arm AVF.  Fairly extensive hematoma in the left upper arm with several large thrombosed pseudoaneurysms.   - Will place right IJ permcatheter to allow 2-4 weeks of access free rest for the left arm.  - Once hematomas have resolved and fistula can be easily accessed, we can remove the perm - Discussed with renal PA, Ramiro Harvest.  Signed,  Criselda Peaches, MD Vascular & Interventional Radiologist [facility] Radiology

## 2012-05-12 NOTE — Procedures (Signed)
Interventional Radiology Procedure Note  Procedure: 1.) Left arm fistulogram 2.) Placement Right IJ 23 cm tunneled HemoSplit HD catheter.  Tip in upper RA and ready for use. Complications: None Recommendations: - Dialyze via HD catheter for 2-4 weeks or until left arm hematomas have resolved and fistula is again easily accessible - Retaining sutures can be removed in 14 days - Once fistula is again accessible and pt able to have good dialysis sessions, return to IR for permcatheter removal  Signed,  Criselda Peaches, MD Vascular & Interventional Radiologist [facility] Radiology

## 2012-05-13 LAB — CULTURE, BLOOD (ROUTINE X 2): Culture: NO GROWTH

## 2012-05-14 NOTE — Addendum Note (Signed)
Addendum created 05/14/12 1111 by Leda Quail, MD   Modules edited: Anesthesia Responsible Staff

## 2012-05-18 ENCOUNTER — Encounter (INDEPENDENT_AMBULATORY_CARE_PROVIDER_SITE_OTHER): Payer: Medicare HMO | Admitting: General Surgery

## 2012-05-21 ENCOUNTER — Ambulatory Visit (INDEPENDENT_AMBULATORY_CARE_PROVIDER_SITE_OTHER): Payer: Medicare HMO | Admitting: General Surgery

## 2012-05-21 ENCOUNTER — Encounter (INDEPENDENT_AMBULATORY_CARE_PROVIDER_SITE_OTHER): Payer: Self-pay | Admitting: General Surgery

## 2012-05-21 VITALS — BP 144/84 | HR 82 | Resp 18 | Ht 65.0 in | Wt 263.0 lb

## 2012-05-21 DIAGNOSIS — K432 Incisional hernia without obstruction or gangrene: Secondary | ICD-10-CM

## 2012-05-23 NOTE — Progress Notes (Signed)
Subjective:     Patient ID: [name], female   DOB: [birth year], 67 y.o.   MRN: [id number]  HPI 30 yof who I know well from urgent incarcerated ventral hernia with sbo that I did loa/primary hernia repair. She did not have permanent mesh placed. She did have long hospital course and recovery eventually requiring debridement of her open wound.  This has eventually healed and is now closed.  She has normal bms without change.  She continues to have chronic abdominal pain that is severe at times.  She is also now on hd t/th/sat and has lue fistula in place that she has had trouble with. She has right ij permcath in now. She recently went to er for fistula issues and the abdominal pain.  A ct was performed which showed a hernia to be present but without any other real abnormality. She comes in today to discuss these results and her abdominal pain.  Review of Systems     Objective:   Physical Exam Abdomen tender at her scar but otherwise nontender, I cannot tell if there is hernia present due to scar tissue and her habitus    Assessment:     Ventral hernia    Plan:     I told her that I was not surprised she has another hernia.  The prior procedure was done urgently for sbo and the hernia was not able to be fixed in the best way.  Her recurrence rate was very high due to this and her comorbidities.  This hernia could be repaired but I don't think this is a good idea for her.  I told her I didn't really think this was necessarily causing her abdominal pain. She certainly could need another urgent operation for hernia complication but I think her risk of an operation now far outweighs that.  I recommended observation to her. I told her I would see back in a few months to see how she was doing and if she got worse or needed to be seen sooner to call

## 2012-07-19 IMAGING — CT CT ABD-PELV W/O CM
2 of 4 series · 13 of 32 positions shown, 18 images · IV contrast (READICAT)
Comparison: 11/18/2010

CLINICAL DATA: Complicated ventral hernia repair with foul-smelling
drainage from right lower quadrant wound

CT ABDOMEN AND PELVIS WITHOUT CONTRAST
TECHNIQUE: Multidetector CT imaging of the abdomen and pelvis was
performed following the standard protocol without intravenous
contrast.

[Series 2: routine abdomen · axial · 0.98mm/px · z∈[-466,-152]mm · 5 of 95 slices shown, 10 images]
[im 16/95  soft-tissue]
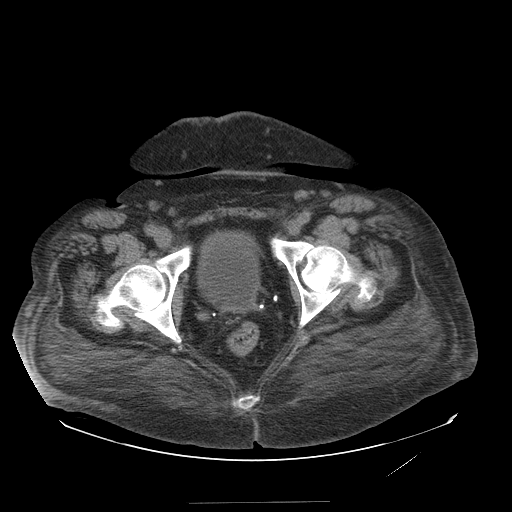
[im 16/95  bone]
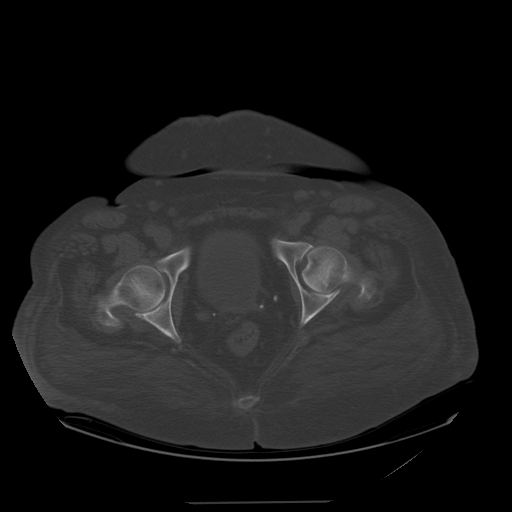
[im 32/95  soft-tissue]
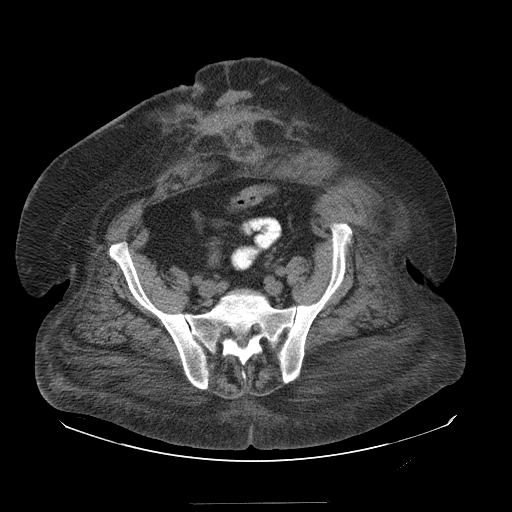
[im 32/95  lung]
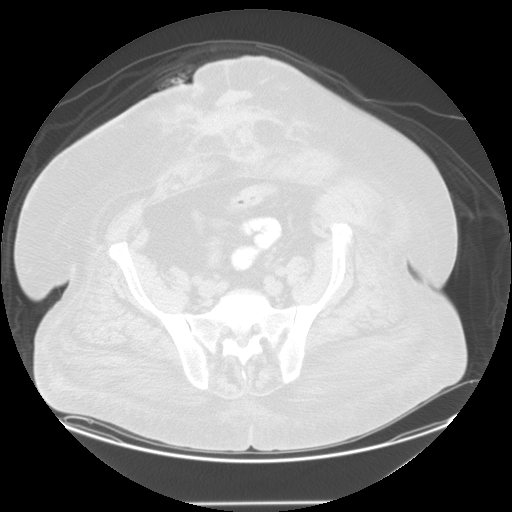
[im 48/95  soft-tissue]
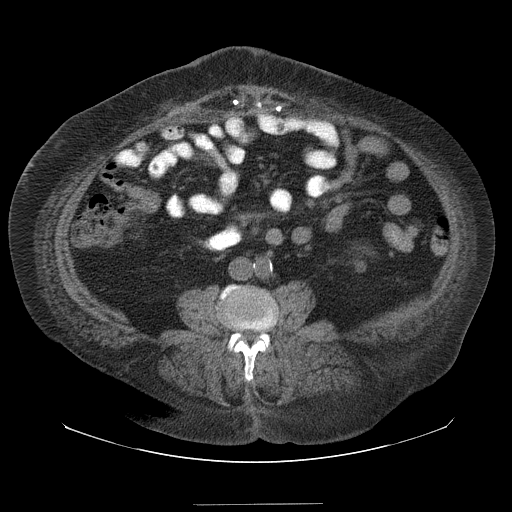
[im 48/95  lung]
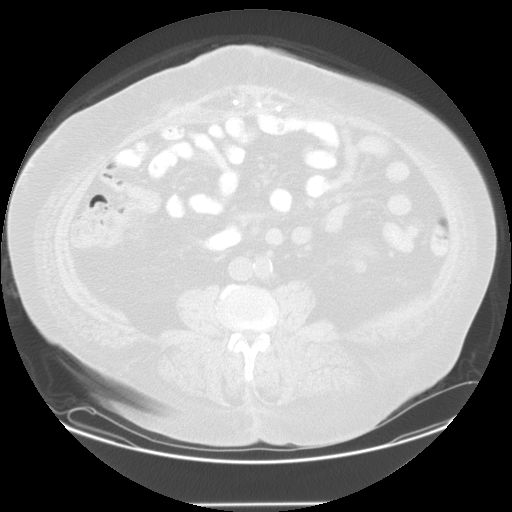
[im 63/95  soft-tissue]
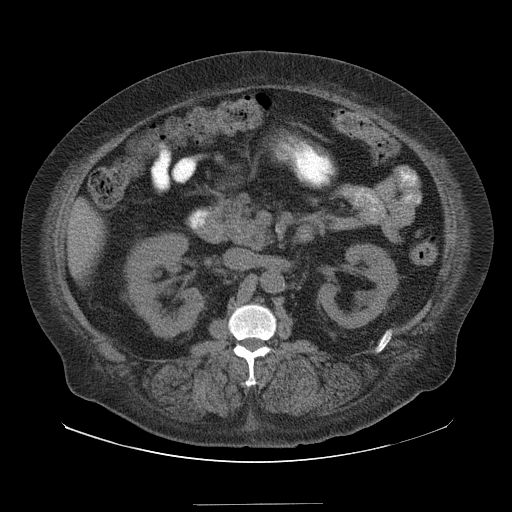
[im 63/95  lung]
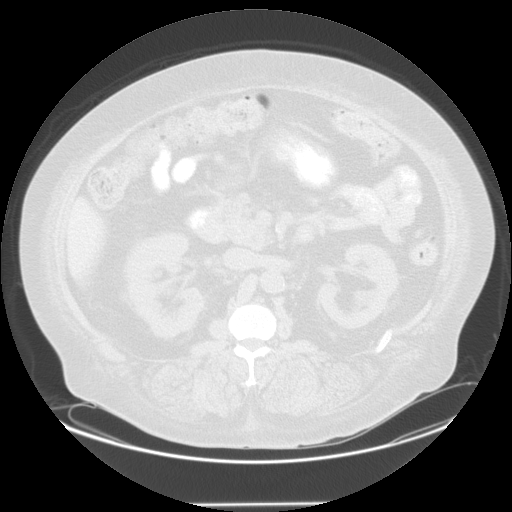
[im 79/95  soft-tissue]
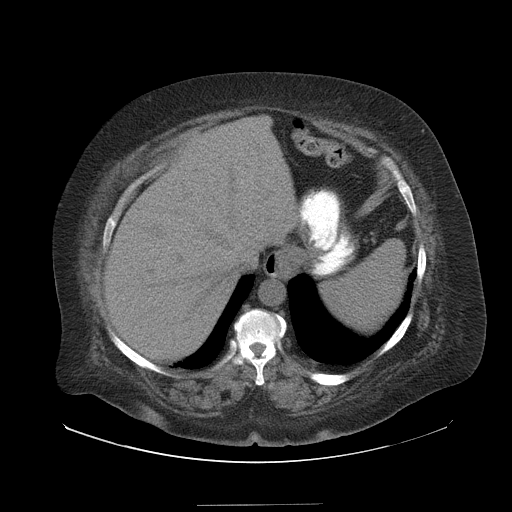
[im 79/95  lung]
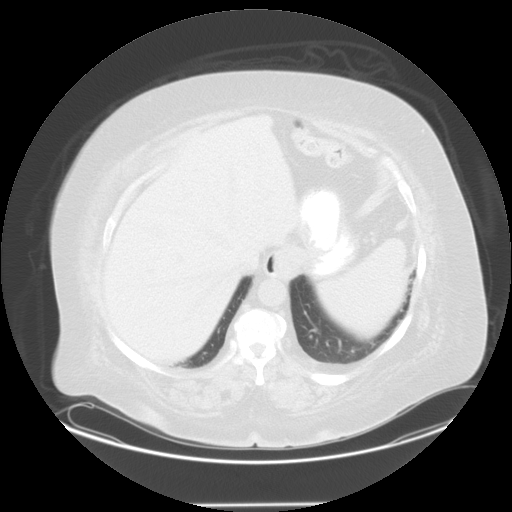

[Series 400: sagittal · sagittal · 0.98mm/px · 8 of 159 slices shown]
[im 15/159  soft-tissue]
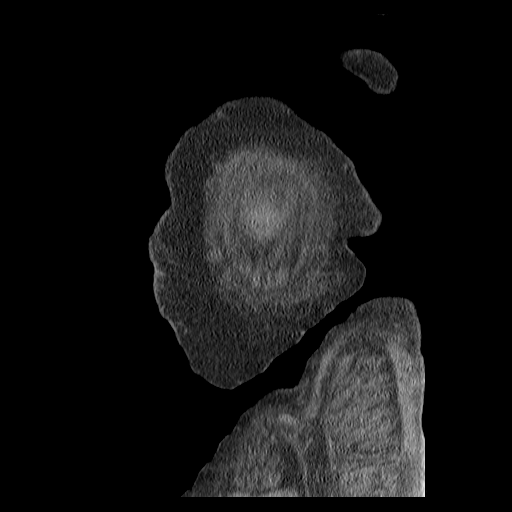
[im 29/159  soft-tissue]
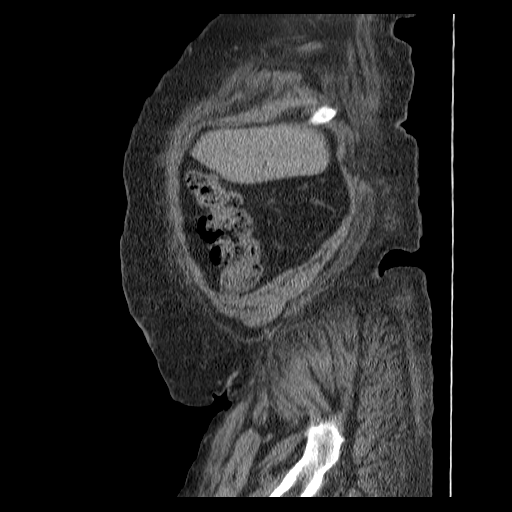
[im 58/159  soft-tissue]
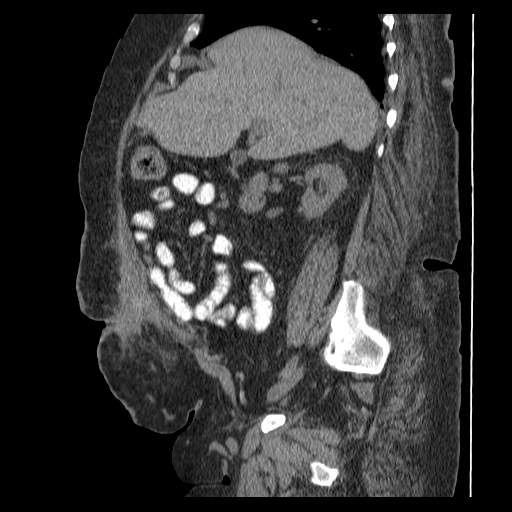
[im 72/159  soft-tissue]
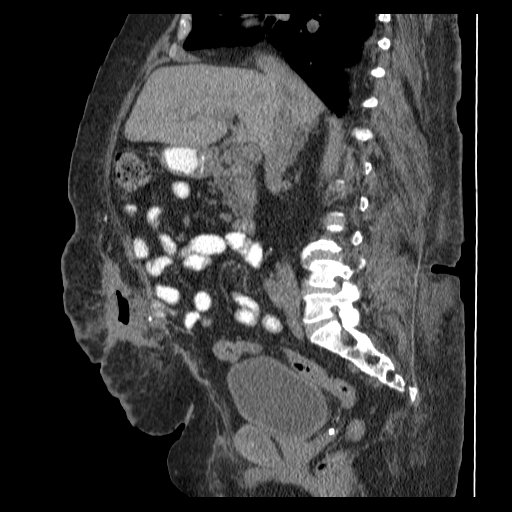
[im 87/159  soft-tissue]
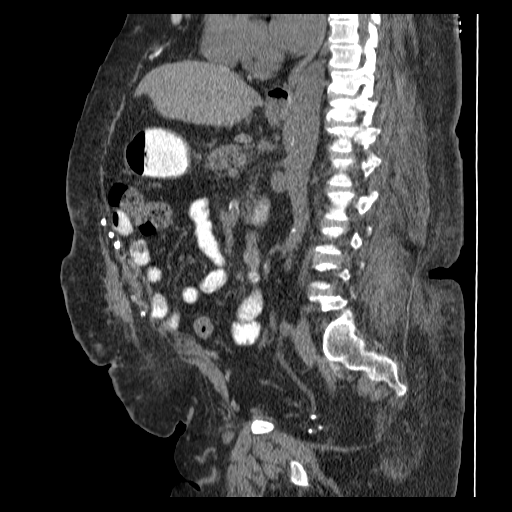
[im 101/159  soft-tissue]
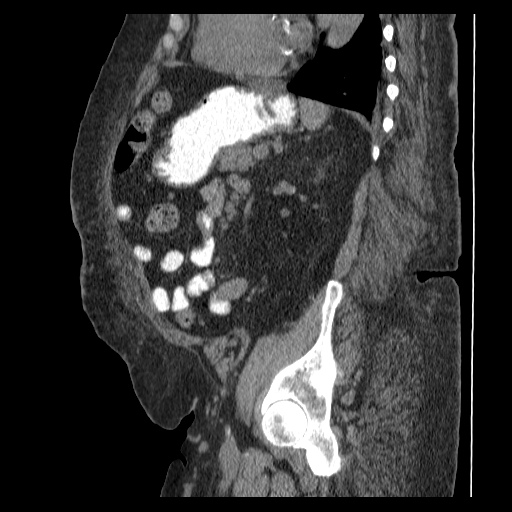
[im 130/159  soft-tissue]
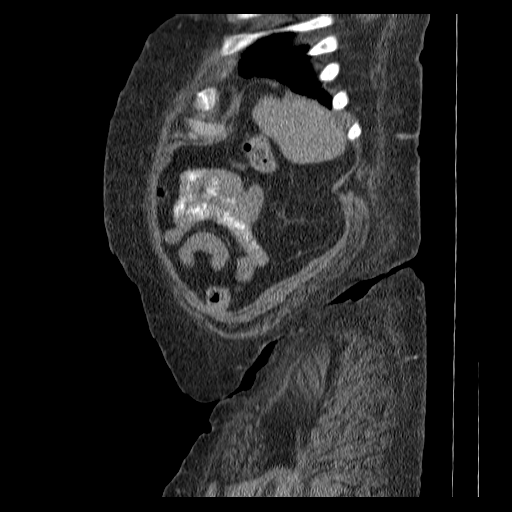
[im 144/159  soft-tissue]
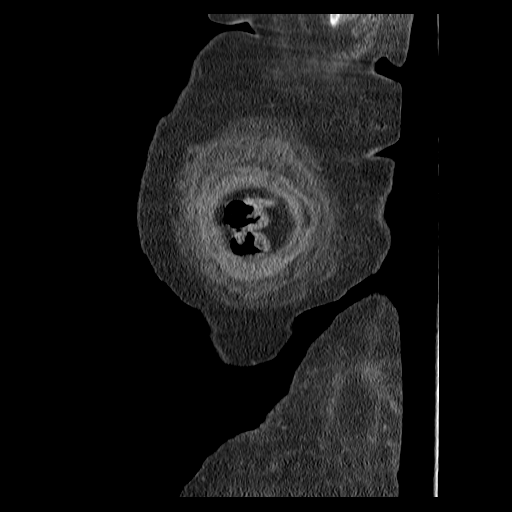

[13 of 32 positions shown; findings below may reference images not displayed]

FINDINGS: Postsurgical changes in the anterior abdominal wall with
prior mesh repair. Tiny fat-containing ventral hernia superiorly.

3.3 x 6.9 x 4.2 cm thick-walled gas and fluid collection in the
right anterior abdominal wall (series 2/image 59), with fistulous
communication to the skin surface (series 2/image 61).

Tiny hiatal hernia.

Unenhanced liver, spleen, pancreas, and adrenal glands are within
normal limits.

Gallbladder is unremarkable.  No intrahepatic or extrahepatic
ductal dilatation.

Bilateral renal cysts, although incompletely characterized in the
absence of intervenous contrast.  No hydronephrosis.

Atherosclerotic calcifications of the abdominal aorta and branch
vessels.

No abdominopelvic ascites.

No suspicious abdominopelvic lymphadenopathy. Small bilateral
inguinal lymph nodes, likely reactive.

Status post hysterectomy.  No adnexal masses.

Bladder is within normal limits.

Degenerative changes of the visualized thoracolumbar spine.
IMPRESSION: Postsurgical changes in the anterior wall from prior ventral hernia
mesh repair.

[DATE] x 6.9 x 4.2 cm thick-walled gas and fluid collection in the
right anterior abdominal wall with fistulous communication to the
skin surface.

## 2012-08-03 ENCOUNTER — Other Ambulatory Visit (HOSPITAL_COMMUNITY): Payer: Self-pay | Admitting: Internal Medicine

## 2012-08-03 DIAGNOSIS — Z1231 Encounter for screening mammogram for malignant neoplasm of breast: Secondary | ICD-10-CM

## 2012-08-23 ENCOUNTER — Encounter (HOSPITAL_COMMUNITY): Payer: Self-pay | Admitting: General Practice

## 2012-08-23 ENCOUNTER — Observation Stay (HOSPITAL_COMMUNITY)
Admission: AD | Admit: 2012-08-23 | Discharge: 2012-08-25 | Disposition: A | Discharge status: Home / Self Care | Payer: Medicare HMO | Source: Ambulatory Visit | Attending: Cardiovascular Disease | Admitting: Cardiovascular Disease

## 2012-08-23 ENCOUNTER — Observation Stay (HOSPITAL_COMMUNITY): Payer: Medicare HMO

## 2012-08-23 DIAGNOSIS — E1142 Type 2 diabetes mellitus with diabetic polyneuropathy: Secondary | ICD-10-CM | POA: Insufficient documentation

## 2012-08-23 DIAGNOSIS — H5440 Blindness, one eye, unspecified eye: Secondary | ICD-10-CM | POA: Insufficient documentation

## 2012-08-23 DIAGNOSIS — N2581 Secondary hyperparathyroidism of renal origin: Secondary | ICD-10-CM | POA: Insufficient documentation

## 2012-08-23 DIAGNOSIS — Z79899 Other long term (current) drug therapy: Secondary | ICD-10-CM | POA: Insufficient documentation

## 2012-08-23 DIAGNOSIS — N186 End stage renal disease: Secondary | ICD-10-CM | POA: Insufficient documentation

## 2012-08-23 DIAGNOSIS — Z794 Long term (current) use of insulin: Secondary | ICD-10-CM | POA: Insufficient documentation

## 2012-08-23 DIAGNOSIS — E1149 Type 2 diabetes mellitus with other diabetic neurological complication: Secondary | ICD-10-CM | POA: Insufficient documentation

## 2012-08-23 DIAGNOSIS — I120 Hypertensive chronic kidney disease with stage 5 chronic kidney disease or end stage renal disease: Secondary | ICD-10-CM | POA: Insufficient documentation

## 2012-08-23 DIAGNOSIS — D638 Anemia in other chronic diseases classified elsewhere: Secondary | ICD-10-CM | POA: Insufficient documentation

## 2012-08-23 DIAGNOSIS — R0789 Other chest pain: Principal | ICD-10-CM | POA: Insufficient documentation

## 2012-08-23 DIAGNOSIS — Z992 Dependence on renal dialysis: Secondary | ICD-10-CM | POA: Insufficient documentation

## 2012-08-23 HISTORY — DX: Gout, unspecified: M10.9

## 2012-08-23 HISTORY — DX: Other chronic pain: G89.29

## 2012-08-23 HISTORY — DX: Personal history of other diseases of the digestive system: Z87.19

## 2012-08-23 HISTORY — DX: Personal history of other medical treatment: Z92.89

## 2012-08-23 HISTORY — DX: Dependence on other enabling machines and devices: Z99.89

## 2012-08-23 HISTORY — DX: Injury, unspecified, initial encounter: T14.90XA

## 2012-08-23 HISTORY — DX: Shortness of breath: R06.02

## 2012-08-23 HISTORY — DX: Low back pain, unspecified: M54.50

## 2012-08-23 HISTORY — DX: End stage renal disease: N18.6

## 2012-08-23 HISTORY — DX: Low back pain: M54.5

## 2012-08-23 HISTORY — DX: Obstructive sleep apnea (adult) (pediatric): G47.33

## 2012-08-23 HISTORY — DX: Other specified disorders of eye and adnexa: H57.89

## 2012-08-23 HISTORY — DX: Dependence on renal dialysis: Z99.2

## 2012-08-23 HISTORY — DX: Headache: R51

## 2012-08-23 HISTORY — DX: Cardiac murmur, unspecified: R01.1

## 2012-08-23 HISTORY — DX: Type 2 diabetes mellitus without complications: E11.9

## 2012-08-23 LAB — COMPREHENSIVE METABOLIC PANEL
ALT: 7 U/L (ref 0–35)
AST: 14 U/L (ref 0–37)
Alkaline Phosphatase: 48 U/L (ref 39–117)
CO2: 27 mEq/L (ref 19–32)
Calcium: 9.7 mg/dL (ref 8.4–10.5)
GFR calc Af Amer: 5 mL/min — ABNORMAL LOW (ref >90)
GFR calc non Af Amer: 4 mL/min — ABNORMAL LOW (ref >90)
Glucose, Bld: 162 mg/dL — ABNORMAL HIGH (ref 70–99)
Potassium: 4.9 mEq/L (ref 3.5–5.1)
Sodium: 142 mEq/L (ref 135–145)
Total Protein: 6.5 g/dL (ref 6.0–8.3)

## 2012-08-23 LAB — HEMOGLOBIN A1C
Hgb A1c MFr Bld: 6 % — ABNORMAL HIGH (ref <5.7)
Mean Plasma Glucose: 126 mg/dL — ABNORMAL HIGH (ref <117)

## 2012-08-23 LAB — CBC WITH DIFFERENTIAL/PLATELET
Basophils Absolute: 0.1 10*3/uL (ref 0.0–0.1)
Lymphocytes Relative: 36 % (ref 12–46)
Lymphs Abs: 2.3 10*3/uL (ref 0.7–4.0)
Neutrophils Relative %: 53 % (ref 43–77)
Platelets: 170 10*3/uL (ref 150–400)
RBC: 3.89 MIL/uL (ref 3.87–5.11)
RDW: 16.5 % — ABNORMAL HIGH (ref 11.5–15.5)
WBC: 6.4 10*3/uL (ref 4.0–10.5)

## 2012-08-23 LAB — PROTIME-INR
INR: 0.98 (ref 0.00–1.49)
Prothrombin Time: 12.8 seconds (ref 11.6–15.2)

## 2012-08-23 LAB — TROPONIN I: Troponin I: <0.3 ng/mL (ref <0.30)

## 2012-08-23 MED ORDER — NITROGLYCERIN 0.4 MG SL SUBL: 0.4000 mg | SUBLINGUAL_TABLET | SUBLINGUAL | Status: DC | PRN

## 2012-08-23 MED ORDER — INSULIN GLARGINE 100 UNIT/ML ~~LOC~~ SOLN
5.0000 [IU] | Freq: Every day | SUBCUTANEOUS | Status: DC
  Administered 2012-08-23 – 2012-08-24 (×2): 5 [IU] via SUBCUTANEOUS
  Filled 2012-08-23 (×4): qty 0.05

## 2012-08-23 MED ORDER — ASPIRIN 300 MG RE SUPP
300.0000 mg | RECTAL | Status: AC
  Filled 2012-08-23: qty 1

## 2012-08-23 MED ORDER — ONDANSETRON HCL 4 MG/2ML IJ SOLN: 4.0000 mg | Freq: Four times a day (QID) | INTRAMUSCULAR | Status: DC | PRN

## 2012-08-23 MED ORDER — SODIUM CHLORIDE 0.9 % IJ SOLN: 3.0000 mL | Freq: Two times a day (BID) | INTRAMUSCULAR | Status: DC

## 2012-08-23 MED ORDER — ISOSORBIDE MONONITRATE ER 60 MG PO TB24
60.0000 mg | ORAL_TABLET | Freq: Every day | ORAL | Status: DC
  Administered 2012-08-24 – 2012-08-25 (×2): 60 mg via ORAL
  Filled 2012-08-23 (×3): qty 1

## 2012-08-23 MED ORDER — FERROUS SULFATE 325 (65 FE) MG PO TABS
325.0000 mg | ORAL_TABLET | Freq: Every day | ORAL | Status: DC
  Administered 2012-08-24: 325 mg via ORAL
  Filled 2012-08-23 (×3): qty 1

## 2012-08-23 MED ORDER — TECHNETIUM TC 99M SESTAMIBI GENERIC - CARDIOLITE
30.0000 | Freq: Once | INTRAVENOUS | Status: AC | PRN
  Administered 2012-08-23: 30 via INTRAVENOUS

## 2012-08-23 MED ORDER — HEPARIN (PORCINE) IN NACL 100-0.45 UNIT/ML-% IJ SOLN
1550.0000 [IU]/h | INTRAMUSCULAR | Status: DC
  Administered 2012-08-23: 1100 [IU]/h via INTRAVENOUS
  Administered 2012-08-24 – 2012-08-25 (×2): 1550 [IU]/h via INTRAVENOUS
  Filled 2012-08-23 (×4): qty 250

## 2012-08-23 MED ORDER — HYDROCODONE-ACETAMINOPHEN 5-325 MG PO TABS
1.0000 | ORAL_TABLET | Freq: Four times a day (QID) | ORAL | Status: DC | PRN
Start: 2012-08-23 — End: 2012-08-25
  Administered 2012-08-25: 2 via ORAL

## 2012-08-23 MED ORDER — HEPARIN BOLUS VIA INFUSION
4000.0000 [IU] | Freq: Once | INTRAVENOUS | Status: AC
  Administered 2012-08-23: 4000 [IU] via INTRAVENOUS
  Filled 2012-08-23: qty 4000

## 2012-08-23 MED ORDER — SODIUM CHLORIDE 0.9 % IJ SOLN: 3.0000 mL | INTRAMUSCULAR | Status: DC | PRN

## 2012-08-23 MED ORDER — CARVEDILOL 25 MG PO TABS
25.0000 mg | ORAL_TABLET | Freq: Two times a day (BID) | ORAL | Status: DC
  Administered 2012-08-23 – 2012-08-25 (×3): 25 mg via ORAL
  Filled 2012-08-23 (×6): qty 1

## 2012-08-23 MED ORDER — ASPIRIN 81 MG PO CHEW
324.0000 mg | CHEWABLE_TABLET | ORAL | Status: AC
  Filled 2012-08-23: qty 1

## 2012-08-23 MED ORDER — ACETAMINOPHEN 325 MG PO TABS: 650.0000 mg | ORAL_TABLET | ORAL | Status: DC | PRN

## 2012-08-23 MED ORDER — ADULT MULTIVITAMIN W/MINERALS CH
1.0000 | ORAL_TABLET | Freq: Every day | ORAL | Status: DC
  Administered 2012-08-24: 1 via ORAL
  Filled 2012-08-23 (×3): qty 1

## 2012-08-23 MED ORDER — ATORVASTATIN CALCIUM 80 MG PO TABS
80.0000 mg | ORAL_TABLET | Freq: Every day | ORAL | Status: DC
  Administered 2012-08-23 – 2012-08-25 (×3): 80 mg via ORAL
  Filled 2012-08-23 (×3): qty 1

## 2012-08-23 MED ORDER — SODIUM CHLORIDE 0.9 % IV SOLN
250.0000 mL | INTRAVENOUS | Status: DC | PRN
  Administered 2012-08-25: 250 mL via INTRAVENOUS

## 2012-08-23 MED ORDER — ASPIRIN EC 81 MG PO TBEC
81.0000 mg | DELAYED_RELEASE_TABLET | Freq: Every day | ORAL | Status: DC
  Administered 2012-08-24: 81 mg via ORAL
  Filled 2012-08-23 (×2): qty 1

## 2012-08-23 MED ORDER — INSULIN ASPART 100 UNIT/ML ~~LOC~~ SOLN
0.0000 [IU] | Freq: Three times a day (TID) | SUBCUTANEOUS | Status: DC
  Administered 2012-08-23: 1 [IU] via SUBCUTANEOUS

## 2012-08-23 MED ORDER — SEVELAMER CARBONATE 800 MG PO TABS
2400.0000 mg | ORAL_TABLET | Freq: Three times a day (TID) | ORAL | Status: DC
  Administered 2012-08-23 – 2012-08-25 (×4): 2400 mg via ORAL
  Filled 2012-08-23 (×8): qty 3

## 2012-08-23 MED ORDER — BISACODYL 5 MG PO TBEC: 5.0000 mg | DELAYED_RELEASE_TABLET | Freq: Every day | ORAL | Status: DC | PRN

## 2012-08-23 MED ORDER — AMLODIPINE BESYLATE 10 MG PO TABS
10.0000 mg | ORAL_TABLET | Freq: Every day | ORAL | Status: DC
  Administered 2012-08-24 – 2012-08-25 (×2): 10 mg via ORAL
  Filled 2012-08-23 (×3): qty 1

## 2012-08-23 MED ORDER — INSULIN ASPART 100 UNIT/ML ~~LOC~~ SOLN
3.0000 [IU] | Freq: Three times a day (TID) | SUBCUTANEOUS | Status: DC
  Administered 2012-08-23: 3 [IU] via SUBCUTANEOUS

## 2012-08-23 NOTE — H&P (Signed)
[name] is an 67 y.o. female.   Chief Complaint: Chest pain HPI: 67 years old female with recurrent left sided chest pain without radiation, more so during dialysis session. No fever or cough.  Past Medical History  Diagnosis Date  . Arthritis   . Abdominal abscess 12-17-10    abdominal abscesses x2 ? one at this time  . Staph aureus infection November 2012   . Myocardial infarction 12-17-10    80's-abnormal testing showed  . Swelling of both ankles 12-17-10  . Blood transfusion, without reported diagnosis 12-17-10    "thinks in [facility]  . Chronic kidney disease   . Chronic renal insufficiency 2009    sees Dr. Florene Glen  every 4-5 mo  . Anemia   . Diabetes mellitus     type 2 IDDM x 6-7 years  . Coronary artery disease     Dr Rexine Gowens-cardiologist 2-3 x year  . CHF (congestive heart failure)   . Diabetes mellitus   . Dysrhythmia     irregular, "skips beats"  . Shortness of breath   . Sleep apnea 2012    CPAP  . Hypertension     sees Dr. Willey Blade  . Neuromuscular disorder     diabetic neuropathy  . Blind right eye     fell and crushed socket and eye fell out, was replaced at [facility]      Past Surgical History  Procedure Laterality Date  . Ventral hernia repair      component separation, repair with biologic  . Vaginal hysterectomy    . Cardiac catheterization  12-17-10    '80's  . Abdominal hysterectomy  12-17-10    Vaginal Hysterectomy  . Cataract extraction w/ intraocular lens implant  12-17-10    bilateral  . Laparotomy  12/18/2010    Procedure: EXPLORATORY LAPAROTOMY;  Surgeon: Rolm Bookbinder, MD;  Location: [facility];  Service: General;  Laterality: N/A;  Abdominal Seroma Evacuation  . Back surgery    . Lumbar disc surgery  1970'-80's    X 3  . Wound debridement  03/20/2011    Procedure: DEBRIDEMENT ABDOMINAL WOUND;  Surgeon: Rolm Bookbinder, MD;  Location: [facility];  Service: General;  Laterality: N/A;  debridement abdominal wall,  placement of wound vac  . Abdominal hysterectomy    . Cataract extraction, bilateral    . Av fistula placement  09/30/2011    Procedure: ARTERIOVENOUS (AV) FISTULA CREATION;  Surgeon: Conrad Pinehurst, MD;  Location: [place];  Service: Vascular;  Laterality: Left;  BRACHIAL-CEPHALIC  . Bascilic vein transposition  11/10/2011    Left arm   . Insertion of dialysis catheter  01/05/2012    Procedure: INSERTION OF DIALYSIS CATHETER;  Surgeon: Conrad [place], MD;  Location: [place];  Service: Vascular;  Laterality: N/A;  Right Internal Jugular Placement  . Eye surgery      Eye replaced after fall  . Bascilic vein transposition  01/28/2012    Procedure: BASCILIC VEIN TRANSPOSITION;  Surgeon: Conrad [place], MD;  Location: [facility];  Service: Vascular;  Laterality: Left;  Second Stage     Family History  Problem Relation Age of Onset  . Hypertension Mother   . Cancer Brother     spine  . Cancer Sister     brain  . Hypertension Maternal Grandmother   . Anesthesia problems Neg Hx   . Hypertension Father    Social History:  reports that she quit smoking about 31 years ago. Her  smoking use included Cigarettes. She smoked 0.00 packs per day. She has never used smokeless tobacco. She reports that she does not drink alcohol or use illicit drugs.  Allergies: No Known Allergies  Medications Prior to Admission  Medication Sig Dispense Refill  . acetaminophen (TYLENOL) 500 MG tablet Take 500 mg by mouth every 6 (six) hours as needed. For pain      . amLODipine (NORVASC) 10 MG tablet Take 10 mg by mouth daily.       Marland Kitchen aspirin EC 81 MG tablet Take 81 mg by mouth daily.        . bisacodyl (DULCOLAX) 5 MG EC tablet Take 5 mg by mouth daily as needed. For constipation      . Blood Glucose Calibration (BAYER BREEZE 2 CONTROL) NORMAL LIQD       . Blood Glucose Calibration (TAI DOC CONTROL) NORMAL SOLN       . Blood Glucose Monitoring Suppl (CLEVER CHEK AUTO-CODE VOICE) DEVI       . Blood Glucose Monitoring Suppl (FORA  V12 BLOOD GLUCOSE SYSTEM) DEVI       . Calcium Carbonate-Vitamin D (CALCIUM 600 + D PO) Take 1 tablet by mouth daily.       . carvedilol (COREG) 25 MG tablet Take 25 mg by mouth 2 (two) times daily with a meal.      . CLEVER CHEK AUTO-CODE VOICE test strip       . ferrous sulfate 325 (65 FE) MG tablet Take 325 mg by mouth daily with breakfast.        . hydrALAZINE (APRESOLINE) 50 MG tablet Take 50 mg by mouth 3 (three) times daily.      Marland Kitchen HYDROcodone-acetaminophen (NORCO/VICODIN) 5-325 MG per tablet Take 1-2 tablets by mouth every 6 (six) hours as needed for pain.      . indomethacin (INDOCIN) 50 MG capsule Take 1 capsule (50 mg total) by mouth 2 (two) times daily with a meal.  60 capsule  0  . insulin glargine (LANTUS) 100 UNIT/ML injection Inject 5 Units into the skin at bedtime. SSI      . insulin lispro (HUMALOG) 100 UNIT/ML injection Inject 5 Units into the skin 3 (three) times daily before meals.       . isosorbide mononitrate (IMDUR) 60 MG 24 hr tablet Take 60 mg by mouth daily.       Elmore Guise Devices (LANCING DEVICE) MISC       . Lite Touch Lancets MISC       . metoprolol succinate (TOPROL-XL) 50 MG 24 hr tablet Take 50 mg by mouth daily.       . Multiple Vitamin (MULITIVITAMIN WITH MINERALS) TABS Take 1 tablet by mouth daily.      Marland Kitchen NITROSTAT 0.4 MG SL tablet Take 0.4 mg by mouth every 5 (five) minutes as needed. For chest pain      . Pharmacist Choice Lancets MISC       . rosuvastatin (CRESTOR) 20 MG tablet Take 20 mg by mouth daily.      . sevelamer carbonate (RENVELA) 800 MG tablet Take 2,400 mg by mouth 3 (three) times daily with meals.        Results for orders placed during the hospital encounter of 08/23/12 (from the past 48 hour(s))  GLUCOSE, CAPILLARY     Status: Abnormal   Collection Time    08/23/12 12:06 PM      Result Value Range   Glucose-Capillary 189 (*) 70 -  99 mg/dL   Comment 1 Notify RN     No results found.  @ROS @ Review of Systems (Positive items checked  otherwise negative)  General: [x ] Weight loss, [ ]  Weight gain, [ ]  Loss of appetite, [ ]  Fever  Neurologic: [ ]  Dizziness, [ ]  Blackouts, [ ]  Headaches, [ ]  Seizure  Ear/Nose/Throat: [x ] Change in eyesight, [ ]  Change in hearing, [ ]  Nose bleeds, [ ]  Sore throat  Vascular: [ ]  Pain in legs with walking, [ ]  Pain in feet while lying flat, [ ]  Non-healing ulcer, Stroke, [ ]  "Mini stroke", [ ]  Slurred speech, [ ]  Temporary blindness, [ ]  Blood clot in vein, [ ]  Phlebitis  Pulmonary: [ ]  Home oxygen, [ ]  Productive cough, [ ]  Bronchitis, [ ]  Coughing up blood,  [ ]  Asthma, [ ]  Wheezing  Musculoskeletal: [ x] Arthritis, [ ]  Joint pain, [ ]  Muscle pain  Cardiac: [x ] Chest pain, [ ]  Chest tightness/pressure, [ ]  Shortness of breath when lying flat, [ ]  Shortness of breath with exertion, [ ]  Palpitations, [ ]  Heart murmur, [ ]  Arrythmia,  [ ]  Atrial fibrillation  Hematologic: [ ]  Bleeding problems, [ ]  Clotting disorder, [ ]  Anemia  Psychiatric: [ ]  Depression, [ ]  Anxiety, [ ]  Attention deficit disorder  Gastrointestinal: [ ]  Black stool,[ ]  Blood in stool, [ ]  Peptic ulcer disease, [ ]  Reflux,  [ ]  Hiatal hernia, [ ]  Trouble swallowing, [ ]  Diarrhea, [ ]  Constipation  Urinary: [x]  Kidney disease, [ ]  Burning with urination, [ ]  Frequent urination, [ ]  Difficulty urinating  Skin: [ ]  Ulcers, [ ]  Rashes  There were no vitals taken for this visit. HENT: [place]/AT, [employer], PERL, EOMI. Hazy right cornea.  Neck: Neck supple.  Cardiovascular: Normal rate and regular rhythm.  Pulmonary/Chest: Effort normal and breath sounds normal. No respiratory distress. She has no wheezes. She has no rales.  Abdominal: Soft. Distended but non-tender. There is no rebound and no guarding.  Neurological: She is alert. Moves all 4 extremities. Skin: Warm and dry.   Assessment/Plan Chest pain r/o MI ESRD HTN DM, II  Place in observation/Nuclear stress test in AM.  Alene Bergerson S 08/23/2012, 12:26 PM

## 2012-08-23 NOTE — Progress Notes (Signed)
ANTICOAGULATION CONSULT NOTE - Initial Consult  Pharmacy Consult for Heparin Indication: chest pain/ACS  No Known Allergies  Patient Measurements: Height: 5\' 5"  (165.1 cm) Weight: 265 lb 4.8 oz (120.339 kg) IBW/kg (Calculated) : 57 Heparin Dosing Weight: 86kg  Vital Signs: Temp: 97.4 F (36.3 C) (08/04 1200) Temp src: Oral (08/04 1200) BP: 109/51 mmHg (08/04 1200) Pulse Rate: 65 (08/04 1200)  Labs: No results found for this basename: HGB, HCT, PLT, APTT, LABPROT, INR, HEPARINUNFRC, CREATININE, CKTOTAL, CKMB, TROPONINI,  in the last 72 hours  Estimated Creatinine Clearance: 13.7 ml/min (by C-G formula based on Cr of 5.09).   Medical History: Past Medical History  Diagnosis Date  . Arthritis   . Abdominal abscess 12-17-10    abdominal abscesses x2 ? one at this time  . Staph aureus infection November 2012   . Myocardial infarction 12-17-10    80's-abnormal testing showed  . Swelling of both ankles 12-17-10  . Blood transfusion, without reported diagnosis 12-17-10    "thinks in [facility]  . Chronic kidney disease   . Chronic renal insufficiency 2009    sees Dr. Florene Glen  every 4-5 mo  . Anemia   . Diabetes mellitus     type 2 IDDM x 6-7 years  . Coronary artery disease     Dr Kadakia-cardiologist 2-3 x year  . CHF (congestive heart failure)   . Diabetes mellitus   . Dysrhythmia     irregular, "skips beats"  . Shortness of breath   . Sleep apnea 2012    CPAP  . Hypertension     sees Dr. Willey Blade  . Neuromuscular disorder     diabetic neuropathy  . Blind right eye     fell and crushed socket and eye fell out, was replaced at [facility]    Medications:  Prescriptions prior to admission  Medication Sig Dispense Refill  . acetaminophen (TYLENOL) 500 MG tablet Take 500 mg by mouth every 6 (six) hours as needed. For pain      . amLODipine (NORVASC) 10 MG tablet Take 10 mg by mouth daily.       Marland Kitchen aspirin EC 81 MG tablet Take 81 mg by mouth daily.         . bisacodyl (DULCOLAX) 5 MG EC tablet Take 5 mg by mouth daily as needed. For constipation      . Blood Glucose Calibration (BAYER BREEZE 2 CONTROL) NORMAL LIQD       . Blood Glucose Calibration (TAI DOC CONTROL) NORMAL SOLN       . Blood Glucose Monitoring Suppl (CLEVER CHEK AUTO-CODE VOICE) DEVI       . Blood Glucose Monitoring Suppl (FORA V12 BLOOD GLUCOSE SYSTEM) DEVI       . Calcium Carbonate-Vitamin D (CALCIUM 600 + D PO) Take 1 tablet by mouth daily.       . carvedilol (COREG) 25 MG tablet Take 25 mg by mouth 2 (two) times daily with a meal.      . CLEVER CHEK AUTO-CODE VOICE test strip       . ferrous sulfate 325 (65 FE) MG tablet Take 325 mg by mouth daily with breakfast.        . hydrALAZINE (APRESOLINE) 50 MG tablet Take 50 mg by mouth 3 (three) times daily.      Marland Kitchen HYDROcodone-acetaminophen (NORCO/VICODIN) 5-325 MG per tablet Take 1-2 tablets by mouth every 6 (six) hours as needed for pain.      . indomethacin (INDOCIN)  50 MG capsule Take 1 capsule (50 mg total) by mouth 2 (two) times daily with a meal.  60 capsule  0  . insulin glargine (LANTUS) 100 UNIT/ML injection Inject 5 Units into the skin at bedtime. SSI      . insulin lispro (HUMALOG) 100 UNIT/ML injection Inject 5 Units into the skin 3 (three) times daily before meals.       . isosorbide mononitrate (IMDUR) 60 MG 24 hr tablet Take 60 mg by mouth daily.       Elmore Guise Devices (LANCING DEVICE) MISC       . Lite Touch Lancets MISC       . metoprolol succinate (TOPROL-XL) 50 MG 24 hr tablet Take 50 mg by mouth daily.       . Multiple Vitamin (MULITIVITAMIN WITH MINERALS) TABS Take 1 tablet by mouth daily.      Marland Kitchen NITROSTAT 0.4 MG SL tablet Take 0.4 mg by mouth every 5 (five) minutes as needed. For chest pain      . Pharmacist Choice Lancets MISC       . rosuvastatin (CRESTOR) 20 MG tablet Take 20 mg by mouth daily.      . sevelamer carbonate (RENVELA) 800 MG tablet Take 2,400 mg by mouth 3 (three) times daily with meals.         Assessment: 67yof to start heparin for CP/ACS. Patient reports no bleeding and not taking any anticoagulants. Patient has ESRD and receives HD on TTS. - Baseline labs pending - Heparin weight: 86kg  Goal of Therapy:  Heparin level 0.3-0.7 units/ml Monitor platelets by anticoagulation protocol: Yes   Plan:  1. Heparin IV bolus 4000 units x 1 2. Heparin drip 1100 units/hr (11 ml/hr) 3. Check heparin level 8 hours after start 4. Daily heparin level and CBC  Earleen Newport [id number] 08/23/2012,1:02 PM

## 2012-08-23 NOTE — Progress Notes (Signed)
Utilization review completed.  

## 2012-08-23 NOTE — Progress Notes (Signed)
ANTICOAGULATION CONSULT NOTE - Follow Up Consult  Pharmacy Consult for heparin Indication: chest pain/ACS  Labs:  Recent Labs  08/23/12 1245 08/23/12 1755 08/23/12 2211  HGB 10.8*  --   --   HCT 34.3*  --   --   PLT 170  --   --   LABPROT 12.8  --   --   INR 0.98  --   --   HEPARINUNFRC  --   --  0.26*  CREATININE 8.75*  --   --   TROPONINI <0.30 <0.30  --     Assessment: 67yo female with subtherapeutic heparin level with initial dosing for CP; gtt was started late and lab drawn just 3.5hr after bolus given, so would expect level to be supratherapeutic now given renal function and true steady-state level to be low at current rate.  Goal of Therapy:  Heparin level 0.3-0.7 units/ml   Plan:  Will increase heparin gtt by 2 units/kg/hr to 1400 units/hr and check level in [place], PharmD, BCPS  08/23/2012,11:07 PM

## 2012-08-24 ENCOUNTER — Observation Stay (HOSPITAL_COMMUNITY): Payer: Medicare HMO

## 2012-08-24 LAB — GLUCOSE, CAPILLARY
Glucose-Capillary: 72 mg/dL (ref 70–99)
Glucose-Capillary: 70 mg/dL (ref 70–99)
Glucose-Capillary: 83 mg/dL (ref 70–99)
Glucose-Capillary: 109 mg/dL — ABNORMAL HIGH (ref 70–99)
Glucose-Capillary: 144 mg/dL — ABNORMAL HIGH (ref 70–99)

## 2012-08-24 LAB — CBC
HCT: 33.1 % — ABNORMAL LOW (ref 36.0–46.0)
Platelets: 180 10*3/uL (ref 150–400)
RDW: 16.6 % — ABNORMAL HIGH (ref 11.5–15.5)
WBC: 7 10*3/uL (ref 4.0–10.5)

## 2012-08-24 LAB — BASIC METABOLIC PANEL
GFR calc non Af Amer: 4 mL/min — ABNORMAL LOW (ref >90)
Glucose, Bld: 76 mg/dL (ref 70–99)
Potassium: 4.8 mEq/L (ref 3.5–5.1)
Sodium: 141 mEq/L (ref 135–145)

## 2012-08-24 LAB — PROTIME-INR: INR: 1.02 (ref 0.00–1.49)

## 2012-08-24 LAB — LIPID PANEL
Cholesterol: 76 mg/dL (ref 0–200)
Triglycerides: 49 mg/dL (ref <150)

## 2012-08-24 LAB — TROPONIN I: Troponin I: <0.3 ng/mL (ref <0.30)

## 2012-08-24 LAB — HEPARIN LEVEL (UNFRACTIONATED): Heparin Unfractionated: 0.29 IU/mL — ABNORMAL LOW (ref 0.30–0.70)

## 2012-08-24 MED ORDER — REGADENOSON 0.4 MG/5ML IV SOLN
0.4000 mg | Freq: Once | INTRAVENOUS | Status: AC
  Administered 2012-08-24: 0.4 mg via INTRAVENOUS
  Filled 2012-08-24: qty 5

## 2012-08-24 MED ORDER — TECHNETIUM TC 99M SESTAMIBI GENERIC - CARDIOLITE
30.0000 | Freq: Once | INTRAVENOUS | Status: AC | PRN
  Administered 2012-08-24: 30 via INTRAVENOUS

## 2012-08-24 NOTE — Progress Notes (Signed)
ANTICOAGULATION CONSULT NOTE - Follow Up Consult  Pharmacy Consult for heparin Indication: chest pain/ACS  Labs:  Recent Labs  08/23/12 1245 08/23/12 1755 08/23/12 2211 08/23/12 2353 08/24/12 0645 08/24/12 0810 08/24/12 2310  HGB 10.8*  --   --   --  10.8*  --   --   HCT 34.3*  --   --   --  33.1*  --   --   PLT 170  --   --   --  180  --   --   LABPROT 12.8  --   --   --  13.2  --   --   INR 0.98  --   --   --  1.02  --   --   HEPARINUNFRC  --   --  0.26*  --   --  0.29* 0.62  CREATININE 8.75*  --   --   --  9.60*  --   --   TROPONINI <0.30 <0.30  --  <0.30  --   --   --     Assessment/Plan:  67yo female now therapeutic on heparin after rate increases.  Will continue gtt at current rate and monitor daily levels.  Wynona Neat, PharmD, BCPS  08/24/2012,11:55 PM

## 2012-08-24 NOTE — Progress Notes (Signed)
Nutrition Brief Note  Patient identified on the Malnutrition Screening Tool (MST).  Pt reports she has lost approximately 100 lb in the past year. Per chart review, a year ago, pt weighed approximately 278 lb and is currently down to 266 lb. This is a weight change of 12 lb x 1 year and is not significant.  Wt Readings from Last 25 Encounters:  08/24/12 266 lb 12.1 oz (121 kg)  05/21/12 263 lb (119.296 kg)  03/12/12 261 lb (118.389 kg)  02/27/12 264 lb (119.75 kg)  02/13/12 267 lb 6.4 oz (121.292 kg)  01/30/12 271 lb (122.925 kg)  01/16/12 271 lb 14.4 oz (123.333 kg)  01/02/12 231 lb (104.781 kg)  01/02/12 231 lb (104.781 kg)  12/12/11 280 lb 14.4 oz (127.415 kg)  11/21/11 286 lb (129.729 kg)  11/07/11 277 lb (125.646 kg)  10/24/11 283 lb 9.6 oz (128.64 kg)  09/26/11 282 lb (127.914 kg)  09/15/11 281 lb 3.2 oz (127.551 kg)  08/29/11 278 lb (126.1 kg)  08/12/11 281 lb (127.461 kg)  08/05/11 282 lb (127.914 kg)  07/18/11 283 lb 12.8 oz (128.731 kg)  06/19/11 283 lb (128.368 kg)  05/16/11 287 lb (130.182 kg)  04/14/11 288 lb 9.6 oz (130.908 kg)  03/24/11 298 lb 8.1 oz (135.4 kg)  03/24/11 298 lb 8.1 oz (135.4 kg)  03/19/11 299 lb 13.2 oz (136 kg)     Body mass index is 44.39 kg/(m^2). Patient meets criteria for Obese Class III based on current BMI.   Current diet order is NPO. Labs and medications reviewed.   No nutrition interventions warranted at this time. If nutrition issues arise, please consult RD.   Inda Coke MS, RD, LDN Pager: [phone] After-hours pager: [phone]

## 2012-08-24 NOTE — Progress Notes (Signed)
Pt had 4 beats of vtach, asymptomatic. Called to Dr. Doylene Canard and also updated him on stress test and pts concern for diaylsis. He will come see pt

## 2012-08-24 NOTE — Progress Notes (Signed)
ANTICOAGULATION CONSULT NOTE - Follow Up Consult  Pharmacy Consult for Heparin Indication: chest pain/ACS  No Known Allergies  Patient Measurements: Height: 5\' 5"  (165.1 cm) Weight: 266 lb 12.1 oz (121 kg) IBW/kg (Calculated) : 57 Heparin Dosing Weight: 86 kg  Vital Signs: Temp: 97.4 F (36.3 C) (08/05 0618) Temp src: Oral (08/05 0618) BP: 154/60 mmHg (08/05 0909) Pulse Rate: 75 (08/05 0909)  Labs:  Recent Labs  08/23/12 1245 08/23/12 1755 08/23/12 2211 08/23/12 2353 08/24/12 0645 08/24/12 0810  HGB 10.8*  --   --   --  10.8*  --   HCT 34.3*  --   --   --  33.1*  --   PLT 170  --   --   --  180  --   LABPROT 12.8  --   --   --  13.2  --   INR 0.98  --   --   --  1.02  --   HEPARINUNFRC  --   --  0.26*  --   --  0.29*  CREATININE 8.75*  --   --   --  9.60*  --   TROPONINI <0.30 <0.30  --  <0.30  --   --     Estimated Creatinine Clearance: 7.3 ml/min (by C-G formula based on Cr of 9.6).  Assessment: 67 y/o F with ESRD of TTS who continues of heparin drip per pharmacy for CP/ACS. NST this AM per cardiology note. HL is 0.29<0.26 on 1400 units/hr of heparin. CBC good. No overt bleeding noted.   Goal of Therapy:  Heparin level 0.3-0.7 units/ml Monitor platelets by anticoagulation protocol: Yes   Plan:  -Increase heparin infusion to 1550 units/hr -Check 8 hour HL -Daily CBC/HL -Monitor for bleeding -F/U cardiology plans, NST results  Thank you for allowing me to take part in this patient's care,  Narda Bonds, PharmD Clinical Pharmacist Phone: [phone] Pager: [phone] 08/24/2012 10:13 AM

## 2012-08-24 NOTE — Progress Notes (Signed)
Subjective:  Feeling better at rest. Afebrile. Nuclear stress test with some fixed defects.  Objective:  Vital Signs in the last 24 hours: Temp:  [97.4 F (36.3 C)-98.8 F (37.1 C)] 98.2 F (36.8 C) (08/05 1412) Pulse Rate:  [63-77] 64 (08/05 1412) Cardiac Rhythm:  [-] Normal sinus rhythm (08/05 0730) Resp:  [18-19] 19 (08/05 1412) BP: (122-154)/(57-75) 143/57 mmHg (08/05 1412) SpO2:  [97 %-100 %] 99 % (08/05 1412) Weight:  [120.3 kg (265 lb 3.4 oz)-121 kg (266 lb 12.1 oz)] 121 kg (266 lb 12.1 oz) (08/05 0618)  Physical Exam: BP Readings from Last 1 Encounters:  08/24/12 143/57     Wt Readings from Last 1 Encounters:  08/24/12 121 kg (266 lb 12.1 oz)    Weight change:   HEENT: [place]/AT, Eyes-Brown, PERL, EOMI, Conjunctiva-Pink, Hazy right cornea. Sclera- Non-icteric Neck: No JVD, No bruit, Trachea midline. Lungs:  Clear, Bilateral. Cardiac:  Regular rhythm, normal S1 and S2, no S3.  Abdomen:  Soft, non-tender. Extremities:  No edema present. No cyanosis. No clubbing. CNS: AxOx3, Cranial nerves grossly intact, moves all 4 extremities. Right handed. Skin: Warm and dry.   Intake/Output from previous day:      Lab Results: BMET    Component Value Date/Time   NA 141 08/24/2012 0645   K 4.8 08/24/2012 0645   CL 102 08/24/2012 0645   CO2 24 08/24/2012 0645   GLUCOSE 76 08/24/2012 0645   BUN 70* 08/24/2012 0645   CREATININE 9.60* 08/24/2012 0645   CREATININE 2.86* 10/10/2010 1140   CALCIUM 9.2 08/24/2012 0645   CALCIUM 8.7 01/08/2010 1121   GFRNONAA 4* 08/24/2012 0645   GFRAA 4* 08/24/2012 0645   CBC    Component Value Date/Time   WBC 7.0 08/24/2012 0645   RBC 3.82* 08/24/2012 0645   RBC 2.56* 09/09/2008 2035   HGB 10.8* 08/24/2012 0645   HCT 33.1* 08/24/2012 0645   PLT 180 08/24/2012 0645   MCV 86.6 08/24/2012 0645   MCH 28.3 08/24/2012 0645   MCHC 32.6 08/24/2012 0645   RDW 16.6* 08/24/2012 0645   LYMPHSABS 2.3 08/23/2012 1245   MONOABS 0.4 08/23/2012 1245   EOSABS 0.3 08/23/2012 1245   BASOSABS 0.1  08/23/2012 1245   CARDIAC ENZYMES Lab Results  Component Value Date   CKTOTAL 87 04/13/2010   CKMB 1.0 04/13/2010   TROPONINI <0.30 08/23/2012    Scheduled Meds: . amLODipine  10 mg Oral Daily  . aspirin EC  81 mg Oral Daily  . atorvastatin  80 mg Oral q1800  . carvedilol  25 mg Oral BID WC  . ferrous sulfate  325 mg Oral Q breakfast  . insulin aspart  0-9 Units Subcutaneous TID WC  . insulin aspart  3 Units Subcutaneous TID WC  . insulin glargine  5 Units Subcutaneous QHS  . isosorbide mononitrate  60 mg Oral Daily  . multivitamin with minerals  1 tablet Oral Daily  . sevelamer carbonate  2,400 mg Oral TID WC  . sodium chloride  3 mL Intravenous Q12H   Continuous Infusions: . heparin 1,550 Units/hr (08/24/12 1236)   PRN Meds:.sodium chloride, acetaminophen, bisacodyl, HYDROcodone-acetaminophen, nitroGLYCERIN, ondansetron (ZOFRAN) IV, sodium chloride  Assessment/Plan: Chest pain r/o MI  ESRD  HTN  DM, II  Cardiac cath in AM.    LOS: 1 day    Dixie Dials  MD  08/24/2012, 6:16 PM

## 2012-08-25 ENCOUNTER — Encounter (HOSPITAL_COMMUNITY)
Admission: AD | Disposition: A | Discharge status: Home / Self Care | Payer: Self-pay | Source: Ambulatory Visit | Attending: Cardiovascular Disease

## 2012-08-25 HISTORY — PX: LEFT HEART CATHETERIZATION WITH CORONARY ANGIOGRAM: SHX5451

## 2012-08-25 LAB — GLUCOSE, CAPILLARY: Glucose-Capillary: 73 mg/dL (ref 70–99)

## 2012-08-25 LAB — CBC
MCHC: 32.5 g/dL (ref 30.0–36.0)
RDW: 16.6 % — ABNORMAL HIGH (ref 11.5–15.5)

## 2012-08-25 LAB — HEPARIN LEVEL (UNFRACTIONATED): Heparin Unfractionated: 0.45 IU/mL (ref 0.30–0.70)

## 2012-08-25 LAB — HEPATITIS B SURFACE ANTIGEN: Hepatitis B Surface Ag: NEGATIVE

## 2012-08-25 SURGERY — LEFT HEART CATHETERIZATION WITH CORONARY ANGIOGRAM
Anesthesia: LOCAL

## 2012-08-25 MED ORDER — HEPARIN (PORCINE) IN NACL 2-0.9 UNIT/ML-% IJ SOLN
INTRAMUSCULAR | Status: AC
  Filled 2012-08-25: qty 1000

## 2012-08-25 MED ORDER — SODIUM CHLORIDE 0.9 % IJ SOLN: 3.0000 mL | INTRAMUSCULAR | Status: DC | PRN

## 2012-08-25 MED ORDER — LIDOCAINE HCL (PF) 1 % IJ SOLN
INTRAMUSCULAR | Status: AC
  Filled 2012-08-25: qty 30

## 2012-08-25 MED ORDER — MIDAZOLAM HCL 2 MG/2ML IJ SOLN
INTRAMUSCULAR | Status: AC
  Filled 2012-08-25: qty 2

## 2012-08-25 MED ORDER — DIAZEPAM 5 MG PO TABS: 5.0000 mg | ORAL_TABLET | ORAL | Status: DC

## 2012-08-25 MED ORDER — SODIUM CHLORIDE 0.9 % IJ SOLN: 3.0000 mL | Freq: Two times a day (BID) | INTRAMUSCULAR | Status: DC

## 2012-08-25 MED ORDER — NITROGLYCERIN 0.2 MG/ML ON CALL CATH LAB
INTRAVENOUS | Status: AC
  Filled 2012-08-25: qty 1

## 2012-08-25 MED ORDER — HEPARIN SODIUM (PORCINE) 1000 UNIT/ML DIALYSIS: 1000.0000 [IU] | INTRAMUSCULAR | Status: DC | PRN

## 2012-08-25 MED ORDER — SODIUM CHLORIDE 0.9 % IV SOLN: 250.0000 mL | INTRAVENOUS | Status: DC | PRN

## 2012-08-25 MED ORDER — NEPRO/CARBSTEADY PO LIQD
237.0000 mL | ORAL | Status: DC | PRN
  Filled 2012-08-25: qty 237

## 2012-08-25 MED ORDER — ALTEPLASE 2 MG IJ SOLR
2.0000 mg | Freq: Once | INTRAMUSCULAR | Status: DC | PRN
  Filled 2012-08-25: qty 2

## 2012-08-25 MED ORDER — DOXERCALCIFEROL 4 MCG/2ML IV SOLN
10.0000 ug | Freq: Once | INTRAVENOUS | Status: AC
  Administered 2012-08-25: 10 ug via INTRAVENOUS
  Filled 2012-08-25: qty 6

## 2012-08-25 MED ORDER — PENTAFLUOROPROP-TETRAFLUOROETH EX AERO: 1.0000 "application " | INHALATION_SPRAY | CUTANEOUS | Status: DC | PRN

## 2012-08-25 MED ORDER — LIDOCAINE-PRILOCAINE 2.5-2.5 % EX CREA: 1.0000 "application " | TOPICAL_CREAM | CUTANEOUS | Status: DC | PRN

## 2012-08-25 MED ORDER — SODIUM CHLORIDE 0.9 % IV SOLN: 100.0000 mL | INTRAVENOUS | Status: DC | PRN

## 2012-08-25 MED ORDER — HYDRALAZINE HCL 20 MG/ML IJ SOLN
10.0000 mg | Freq: Once | INTRAMUSCULAR | Status: AC
  Administered 2012-08-25: 10 mg via INTRAVENOUS

## 2012-08-25 MED ORDER — LIDOCAINE HCL (PF) 1 % IJ SOLN: 5.0000 mL | INTRAMUSCULAR | Status: DC | PRN

## 2012-08-25 MED ORDER — FENTANYL CITRATE 0.05 MG/ML IJ SOLN
INTRAMUSCULAR | Status: AC
  Filled 2012-08-25: qty 2

## 2012-08-25 MED ORDER — HYDRALAZINE HCL 20 MG/ML IJ SOLN
INTRAMUSCULAR | Status: AC
  Filled 2012-08-25: qty 1

## 2012-08-25 NOTE — Progress Notes (Signed)
DC IV, DC Tele, DC Home. Discharge instructions and home medications discussed with patient and patient's friend. Patient and friend denied any questions or concerns at this time. Patient leaving unit via wheelchair and appears in no acute distress.

## 2012-08-25 NOTE — Consult Note (Signed)
I have personally seen and examined this patient and agree with the assessment/plan as outlined above by Lyles PA.will continue to provide ESRD care s/p cardiac catheterization done for angina type pain on HD. Chrisanna Mishra K.,MD 08/25/2012 1:50 PM

## 2012-08-25 NOTE — Procedures (Signed)
Patient seen on Hemodialysis. QB 200, UF goal 4.5L Treatment adjusted as needed.  Elmarie Shiley MD [facility]. Office # [phone] Pager # [phone] 1:53 PM

## 2012-08-25 NOTE — Consult Note (Signed)
[facility] Renal Consultation Note  Indication for Consultation:  Management of ESRD/hemodialysis; anemia, hypertension/volume and secondary hyperparathyroidism  HPI: [name] is a 67 y.o. female with ESRD, on dialysis since 12/2011 on TTS at the [facility], who has had recurrent chest pain, mostly after dialysis, and was admitted for evaluation on 8/4.  Her nuclear stress test yesterday indicated fixed defects in the apex and the inferior wall, so this morning she had left heart catheterization per Dr. Doylene Canard.  The results were normal with EF 60%, but she will need her dialysis post-procedure today.  She was seen after the procedure and had no complaints.  Dialysis Orders:  TTS @ [place] 4 hrs 15 mins  118 kg   400/A1.5   2K/2,25Ca   Heparin 13,000 U AVF @ LUA (still using 16G needles), also R IJ catheter Hectorol 10 mcg   Epogen 11,000 U   Venofer 0   Past Medical History  Diagnosis Date  . Abdominal abscess 12-17-10    abdominal abscesses x2 ? one at this time  . Staph aureus infection November 2012   . Swelling of both ankles 12-17-10  . Anemia   . Coronary artery disease     Dr Kadakia-cardiologist 2-3 x year  . CHF (congestive heart failure)   . Dysrhythmia     irregular, "skips beats"  . Hypertension     sees Dr. Willey Blade  . Neuromuscular disorder     diabetic neuropathy  . Blind right eye     fell and crushed socket and eye fell out, was replaced at [facility]  . Heart murmur   . Myocardial infarction 1970's or 1980's  . Exertional shortness of breath   . Inhalation injury     "worked at [employer]; can't inhale polyurethane or paint, etc" (08/23/2012)  . OSA on CPAP   . Type II diabetes mellitus     "over 10 years now" (08/23/2012)  . History of blood transfusion     "lots before starting dialysis" (08/23/2012)  . H/O hiatal hernia   . Headache(784.0)     "weekly sometimes; since I fell and hit my head in 07/2011 08/23/2012)  .  Eye drainage     "lots; since fall 07/2011" (08/23/2012)  . Arthritis     "all over" (08/23/2012)  . Chronic lower back pain   . Gout   . ESRD (end stage renal disease) on dialysis     "started 12/2011; [place]; TTS" (08/23/2012   Past Surgical History  Procedure Laterality Date  . Ventral hernia repair  2012-2013    component separation, repair with biologic; "had 3 surgeries to fix it" (08/23/2012)  . Cataract extraction w/ intraocular lens implant Bilateral 1990's  . Laparotomy  12/18/2010    Procedure: EXPLORATORY LAPAROTOMY;  Surgeon: Rolm Bookbinder, MD;  Location: [facility];  Service: General;  Laterality: N/A;  Abdominal Seroma Evacuation  . Back surgery    . Lumbar disc surgery  1970'-80's    X 3  . Wound debridement  03/20/2011    Procedure: DEBRIDEMENT ABDOMINAL WOUND;  Surgeon: Rolm Bookbinder, MD;  Location: [place];  Service: General;  Laterality: N/A;  debridement abdominal wall, placement of wound vac  . Av fistula placement  09/30/2011    Procedure: ARTERIOVENOUS (AV) FISTULA CREATION;  Surgeon: Conrad Middlebury, MD;  Location: [place];  Service: Vascular;  Laterality: Left;  BRACHIAL-CEPHALIC  . Bascilic vein transposition Left 11/10/2011    Left arm   .  Insertion of dialysis catheter  01/05/2012    Procedure: INSERTION OF DIALYSIS CATHETER;  Surgeon: Conrad Chilili, MD;  Location: [place];  Service: Vascular;  Laterality: N/A;  Right Internal Jugular Placement  . Bascilic vein transposition  01/28/2012    Procedure: BASCILIC VEIN TRANSPOSITION;  Surgeon: Conrad Freeborn, MD;  Location: [place];  Service: Vascular;  Laterality: Left;  Second Stage   . Appendectomy    . Hernia repair    . Joint replacement    . Total knee arthroplasty Left 1980's  . Vaginal hysterectomy  1970's  . Dilation and curettage of uterus  1960's  . Intraocular prostheses insertion Right 2013    "fell; knocked my eye outl" (08/23/2012)  . Eye surgery    . Cardiac catheterization  1980's   Family History   Problem Relation Age of Onset  . Hypertension Mother   . Cancer Brother     spine  . Cancer Sister     brain  . Hypertension Maternal Grandmother   . Anesthesia problems Neg Hx   . Hypertension Father    Social History  She quit smoking cigarettes over 30 years ago.  She occasionally drank alcohol socially, but denies any use in years.  She denies any history of illicit drug use.  She is widowed with four children and currently lives alone, but previously worked at [employer].  No Known Allergies Prior to Admission medications   Medication Sig Start Date End Date Taking? Authorizing Provider  acetaminophen (TYLENOL) 500 MG tablet Take 500 mg by mouth every 6 (six) hours as needed. For pain   Yes Historical Provider, MD  amLODipine (NORVASC) 10 MG tablet Take 10 mg by mouth daily.    Yes Historical Provider, MD  aspirin EC 81 MG tablet Take 81 mg by mouth daily.     Yes Historical Provider, MD  bisacodyl (DULCOLAX) 5 MG EC tablet Take 5 mg by mouth daily as needed. For constipation   Yes Historical Provider, MD  Calcium Carbonate-Vitamin D (CALCIUM 600 + D PO) Take 1 tablet by mouth daily.    Yes Historical Provider, MD  carvedilol (COREG) 25 MG tablet Take 25 mg by mouth 2 (two) times daily with a meal.   Yes Historical Provider, MD  ferrous sulfate 325 (65 FE) MG tablet Take 325 mg by mouth daily with breakfast.     Yes Historical Provider, MD  hydrALAZINE (APRESOLINE) 50 MG tablet Take 50 mg by mouth 3 (three) times daily.   Yes Historical Provider, MD  HYDROcodone-acetaminophen (NORCO/VICODIN) 5-325 MG per tablet Take 1-2 tablets by mouth every 6 (six) hours as needed for pain.   Yes Historical Provider, MD  indomethacin (INDOCIN) 50 MG capsule Take 50 mg by mouth 2 (two) times daily as needed (for gout pain).   Yes Historical Provider, MD  insulin glargine (LANTUS) 100 UNIT/ML injection Inject 10 Units into the skin at bedtime.   Yes Historical Provider, MD  isosorbide mononitrate  (IMDUR) 60 MG 24 hr tablet Take 60 mg by mouth daily.    Yes Historical Provider, MD  metoprolol succinate (TOPROL-XL) 50 MG 24 hr tablet Take 50 mg by mouth daily.  10/13/11  Yes Historical Provider, MD  Multiple Vitamin (MULITIVITAMIN WITH MINERALS) TABS Take 1 tablet by mouth daily.   Yes Historical Provider, MD  nitroGLYCERIN (NITROSTAT) 0.4 MG SL tablet Place 0.4 mg under the tongue every 5 (five) minutes as needed for chest pain.   Yes Historical Provider,  MD  rosuvastatin (CRESTOR) 20 MG tablet Take 20 mg by mouth daily.   Yes Historical Provider, MD  sevelamer carbonate (RENVELA) 800 MG tablet Take 2,400 mg by mouth 3 (three) times daily with meals.   Yes Historical Provider, MD   Labs:  Results for orders placed during the hospital encounter of 08/23/12 (from the past 48 hour(s))  GLUCOSE, CAPILLARY     Status: Abnormal   Collection Time    08/23/12 12:06 PM      Result Value Range   Glucose-Capillary 189 (*) 70 - 99 mg/dL   Comment 1 Notify RN    TROPONIN I     Status: None   Collection Time    08/23/12 12:45 PM      Result Value Range   Troponin I <0.30  <0.30 ng/mL   Comment:            Due to the release kinetics of cTnI,     a negative result within the first hours     of the onset of symptoms does not rule out     myocardial infarction with certainty.     If myocardial infarction is still suspected,     repeat the test at appropriate intervals.  PROTIME-INR     Status: None   Collection Time    08/23/12 12:45 PM      Result Value Range   Prothrombin Time 12.8  11.6 - 15.2 seconds   INR 0.98  0.00 - 1.49  CBC WITH DIFFERENTIAL     Status: Abnormal   Collection Time    08/23/12 12:45 PM      Result Value Range   WBC 6.4  4.0 - 10.5 K/uL   RBC 3.89  3.87 - 5.11 MIL/uL   Hemoglobin 10.8 (*) 12.0 - 15.0 g/dL   HCT 34.3 (*) 36.0 - 46.0 %   MCV 88.2  78.0 - 100.0 fL   MCH 27.8  26.0 - 34.0 pg   MCHC 31.5  30.0 - 36.0 g/dL   RDW 16.5 (*) 11.5 - 15.5 %   Platelets  170  150 - 400 K/uL   Neutrophils Relative % 53  43 - 77 %   Neutro Abs 3.4  1.7 - 7.7 K/uL   Lymphocytes Relative 36  12 - 46 %   Lymphs Abs 2.3  0.7 - 4.0 K/uL   Monocytes Relative 6  3 - 12 %   Monocytes Absolute 0.4  0.1 - 1.0 K/uL   Eosinophils Relative 4  0 - 5 %   Eosinophils Absolute 0.3  0.0 - 0.7 K/uL   Basophils Relative 1  0 - 1 %   Basophils Absolute 0.1  0.0 - 0.1 K/uL  COMPREHENSIVE METABOLIC PANEL     Status: Abnormal   Collection Time    08/23/12 12:45 PM      Result Value Range   Sodium 142  135 - 145 mEq/L   Potassium 4.9  3.5 - 5.1 mEq/L   Chloride 101  96 - 112 mEq/L   CO2 27  19 - 32 mEq/L   Glucose, Bld 162 (*) 70 - 99 mg/dL   BUN 61 (*) 6 - 23 mg/dL   Creatinine, Ser 8.75 (*) 0.50 - 1.10 mg/dL   Calcium 9.7  8.4 - 10.5 mg/dL   Total Protein 6.5  6.0 - 8.3 g/dL   Albumin 3.1 (*) 3.5 - 5.2 g/dL   AST 14  0 - 37 U/L  ALT 7  0 - 35 U/L   Alkaline Phosphatase 48  39 - 117 U/L   Total Bilirubin 0.3  0.3 - 1.2 mg/dL   GFR calc non Af Amer 4 (*) >90 mL/min   GFR calc Af Amer 5 (*) >90 mL/min   Comment:            The eGFR has been calculated     using the CKD EPI equation.     This calculation has not been     validated in all clinical     situations.     eGFR's persistently     <90 mL/min signify     possible Chronic Kidney Disease.  HEMOGLOBIN A1C     Status: Abnormal   Collection Time    08/23/12 12:45 PM      Result Value Range   Hemoglobin A1C 6.0 (*) <5.7 %   Comment: (NOTE)                                                                               According to the ADA Clinical Practice Recommendations for 2011, when     HbA1c is used as a screening test:      >=6.5%   Diagnostic of Diabetes Mellitus               (if abnormal result is confirmed)     5.7-6.4%   Increased risk of developing Diabetes Mellitus     References:Diagnosis and Classification of Diabetes Mellitus,Diabetes     [id number] 1):S62-S69 and Standards of Medical  Care in             Diabetes - 2011,Diabetes Care,2011,34 (Suppl 1):S11-S61.   Mean Plasma Glucose 126 (*) <117 mg/dL   Comment: Performed at [place], CAPILLARY     Status: None   Collection Time    08/23/12  4:20 PM      Result Value Range   Glucose-Capillary 83  70 - 99 mg/dL   Comment 1 Notify RN    TROPONIN I     Status: None   Collection Time    08/23/12  5:55 PM      Result Value Range   Troponin I <0.30  <0.30 ng/mL   Comment:            Due to the release kinetics of cTnI,     a negative result within the first hours     of the onset of symptoms does not rule out     myocardial infarction with certainty.     If myocardial infarction is still suspected,     repeat the test at appropriate intervals.  GLUCOSE, CAPILLARY     Status: Abnormal   Collection Time    08/23/12  7:04 PM      Result Value Range   Glucose-Capillary 124 (*) 70 - 99 mg/dL  GLUCOSE, CAPILLARY     Status: None   Collection Time    08/23/12  9:55 PM      Result Value Range   Glucose-Capillary 85  70 - 99 mg/dL   Comment 1 Notify RN    HEPARIN LEVEL (UNFRACTIONATED)  Status: Abnormal   Collection Time    08/23/12 10:11 PM      Result Value Range   Heparin Unfractionated 0.26 (*) 0.30 - 0.70 IU/mL   Comment:            IF HEPARIN RESULTS ARE BELOW     EXPECTED VALUES, AND PATIENT     DOSAGE HAS BEEN CONFIRMED,     SUGGEST FOLLOW UP TESTING     OF ANTITHROMBIN III LEVELS.  TROPONIN I     Status: None   Collection Time    08/23/12 11:53 PM      Result Value Range   Troponin I <0.30  <0.30 ng/mL   Comment:            Due to the release kinetics of cTnI,     a negative result within the first hours     of the onset of symptoms does not rule out     myocardial infarction with certainty.     If myocardial infarction is still suspected,     repeat the test at appropriate intervals.  GLUCOSE, CAPILLARY     Status: None   Collection Time    08/24/12  1:57 AM      Result  Value Range   Glucose-Capillary 72  70 - 99 mg/dL  BASIC METABOLIC PANEL     Status: Abnormal   Collection Time    08/24/12  6:45 AM      Result Value Range   Sodium 141  135 - 145 mEq/L   Potassium 4.8  3.5 - 5.1 mEq/L   Chloride 102  96 - 112 mEq/L   CO2 24  19 - 32 mEq/L   Glucose, Bld 76  70 - 99 mg/dL   BUN 70 (*) 6 - 23 mg/dL   Creatinine, Ser 9.60 (*) 0.50 - 1.10 mg/dL   Calcium 9.2  8.4 - 10.5 mg/dL   GFR calc non Af Amer 4 (*) >90 mL/min   GFR calc Af Amer 4 (*) >90 mL/min   Comment:            The eGFR has been calculated     using the CKD EPI equation.     This calculation has not been     validated in all clinical     situations.     eGFR's persistently     <90 mL/min signify     possible Chronic Kidney Disease.  PROTIME-INR     Status: None   Collection Time    08/24/12  6:45 AM      Result Value Range   Prothrombin Time 13.2  11.6 - 15.2 seconds   INR 1.02  0.00 - 1.49  CBC     Status: Abnormal   Collection Time    08/24/12  6:45 AM      Result Value Range   WBC 7.0  4.0 - 10.5 K/uL   RBC 3.82 (*) 3.87 - 5.11 MIL/uL   Hemoglobin 10.8 (*) 12.0 - 15.0 g/dL   HCT 33.1 (*) 36.0 - 46.0 %   MCV 86.6  78.0 - 100.0 fL   MCH 28.3  26.0 - 34.0 pg   MCHC 32.6  30.0 - 36.0 g/dL   RDW 16.6 (*) 11.5 - 15.5 %   Platelets 180  150 - 400 K/uL  LIPID PANEL     Status: None   Collection Time    08/24/12  6:45 AM  Result Value Range   Cholesterol 76  0 - 200 mg/dL   Triglycerides 49  <150 mg/dL   HDL 49  >39 mg/dL   Total CHOL/HDL Ratio 1.6     VLDL 10  0 - 40 mg/dL   LDL Cholesterol 17  0 - 99 mg/dL   Comment:            Total Cholesterol/HDL:CHD Risk     Coronary Heart Disease Risk Table                         Men   Women      1/2 Average Risk   3.4   3.3      Average Risk       5.0   4.4      2 X Average Risk   9.6   7.1      3 X Average Risk  23.4   11.0                Use the calculated Patient Ratio     above and the CHD Risk Table     to  determine the patient's CHD Risk.                ATP III CLASSIFICATION (LDL):      <100     mg/dL   Optimal      100-129  mg/dL   Near or Above                        Optimal      130-159  mg/dL   Borderline      160-189  mg/dL   High      >190     mg/dL   Very High  GLUCOSE, CAPILLARY     Status: None   Collection Time    08/24/12  7:23 AM      Result Value Range   Glucose-Capillary 70  70 - 99 mg/dL   Comment 1 Notify RN    HEPARIN LEVEL (UNFRACTIONATED)     Status: Abnormal   Collection Time    08/24/12  8:10 AM      Result Value Range   Heparin Unfractionated 0.29 (*) 0.30 - 0.70 IU/mL   Comment:            IF HEPARIN RESULTS ARE BELOW     EXPECTED VALUES, AND PATIENT     DOSAGE HAS BEEN CONFIRMED,     SUGGEST FOLLOW UP TESTING     OF ANTITHROMBIN III LEVELS.  GLUCOSE, CAPILLARY     Status: None   Collection Time    08/24/12 12:06 PM      Result Value Range   Glucose-Capillary 83  70 - 99 mg/dL  GLUCOSE, CAPILLARY     Status: Abnormal   Collection Time    08/24/12  5:00 PM      Result Value Range   Glucose-Capillary 109 (*) 70 - 99 mg/dL  GLUCOSE, CAPILLARY     Status: Abnormal   Collection Time    08/24/12  9:56 PM      Result Value Range   Glucose-Capillary 144 (*) 70 - 99 mg/dL   Comment 1 Notify RN    HEPARIN LEVEL (UNFRACTIONATED)     Status: None   Collection Time    08/24/12 11:10 PM      Result Value Range   Heparin Unfractionated  0.62  0.30 - 0.70 IU/mL   Comment:            IF HEPARIN RESULTS ARE BELOW     EXPECTED VALUES, AND PATIENT     DOSAGE HAS BEEN CONFIRMED,     SUGGEST FOLLOW UP TESTING     OF ANTITHROMBIN III LEVELS.  GLUCOSE, CAPILLARY     Status: None   Collection Time    08/25/12  7:22 AM      Result Value Range   Glucose-Capillary 93  70 - 99 mg/dL   Comment 1 Notify RN    CBC     Status: Abnormal   Collection Time    08/25/12  7:37 AM      Result Value Range   WBC 6.7  4.0 - 10.5 K/uL   RBC 3.95  3.87 - 5.11 MIL/uL    Hemoglobin 11.1 (*) 12.0 - 15.0 g/dL   HCT 34.2 (*) 36.0 - 46.0 %   MCV 86.6  78.0 - 100.0 fL   MCH 28.1  26.0 - 34.0 pg   MCHC 32.5  30.0 - 36.0 g/dL   RDW 16.6 (*) 11.5 - 15.5 %   Platelets 180  150 - 400 K/uL  HEPARIN LEVEL (UNFRACTIONATED)     Status: None   Collection Time    08/25/12  7:37 AM      Result Value Range   Heparin Unfractionated 0.45  0.30 - 0.70 IU/mL   Comment:            IF HEPARIN RESULTS ARE BELOW     EXPECTED VALUES, AND PATIENT     DOSAGE HAS BEEN CONFIRMED,     SUGGEST FOLLOW UP TESTING     OF ANTITHROMBIN III LEVELS.  GLUCOSE, CAPILLARY     Status: None   Collection Time    08/25/12  9:09 AM      Result Value Range   Glucose-Capillary 77  70 - 99 mg/dL   Constitutional: negative for chills, fatigue, fevers and sweats Ears, nose, mouth, throat, and face: negative for earaches, hoarseness, nasal congestion and sore throat Respiratory: negative for cough, dyspnea on exertion, hemoptysis and sputum Cardiovascular: negative for chest pain, chest pressure/discomfort, dyspnea, orthopnea and palpitations Gastrointestinal: negative for abdominal pain, change in bowel habits, nausea and vomiting Genitourinary:negative, non-oliguric Musculoskeletal:negative for arthralgias, back pain, myalgias and neck pain Neurological: negative for dizziness, gait problems, headaches, paresthesia and weakness  Physical Exam: Filed Vitals:   08/25/12 0918  BP: 173/78  Pulse:   Temp:   Resp:      General appearance: alert, cooperative and no distress Head: Normocephalic, without obvious abnormality, atraumatic Neck: no adenopathy, no carotid bruit, no JVD and supple, symmetrical, trachea midline Resp: clear to auscultation bilaterally Cardio: regular rate and rhythm, S1, S2 normal, no murmur, click, rub or gallop GI: soft, non-tender; bowel sounds normal; no masses,  no organomegaly Extremities: extremities normal, atraumatic, no cyanosis or edema Neurologic: Grossly  normal Dialysis Access: AVF @ LUA with + bruit, R IJ catheter   Assessment/Plan: 1. Recurrent chest pain - s/p nuclear stress test 8/5 & normal catheterization today per Dr. Doylene Canard. 2. ESRD -  HD on TTS @ [place], but postponed from yesterday due to procedure; K 4.8.  HD pending today. 3. Hypertension/volume  - BP 173/78 on Amlodipine 10 mg qd, Carvedilol 25 mg bid; current wt 121.3 kg with EDW 118; usually reaches goals. 4. Anemia  - Hgb 11.1 on outpatient Epogen. 5. Metabolic bone  disease - Ca 9.2, last P 7.5 & iPTH 436 (7/24); Hecorol 10 mcg, Renvela 3 with meals. 6. Nutrition - Last Alb 3.8, renal diet, vitamin. 7. DM Type 2 - on insulin; last Hgb A1C 6. 8. R eye blindness - sec to injury in 04/2011, replaced at [facility].  Juliona Vales 08/25/2012, 9:23 AM   Attending Nephrologist: Elmarie Shiley, MD

## 2012-08-25 NOTE — CV Procedure (Signed)
PROCEDURE:  Left heart catheterization with selective coronary angiography, left ventriculogram.  CLINICAL HISTORY:  This is a 67 year old female with recurrent chest pain mostly after dialysis.  The risks, benefits, and details of the procedure were explained to the patient.  The patient verbalized understanding and wanted to proceed.  Informed written consent was obtained.  PROCEDURE TECHNIQUE:  The patient was approached from the right femoral artery using a 5 French short sheath.  Left coronary angiography was done using a Judkins L4 guide catheter.  Right coronary angiography was done using a Judkins R4 guide catheter.  Left ventriculography was done using a pigtail catheter.    CONTRAST:  Total of 45 cc.  COMPLICATIONS:  None.  At the end of the procedure a manual device was used for hemostasis.    HEMODYNAMICS:  Aortic pressure was 157/75; LV pressure was 160/5; LVEDP 20.  There was no gradient between the left ventricle and aorta.    ANGIOGRAM/CORONARY ARTERIOGRAM:   The left main coronary artery is short and unremarkable.  The left anterior descending artery is unremarkable and wraps around apex of the heart supplying posterior half of the septum. Diagonal branch is unremarkable  The left circumflex artery is unremarkable with large ramus branch and small OM branch.  The right coronary artery is dominant and essentially unremarkable.  LEFT VENTRICULOGRAM:  Left ventricular angiogram was done in the 30 RAO projection and revealed normal left ventricular wall motion and systolic function with an estimated ejection fraction of 60%.  LVEDP was 20 mmHg.  IMPRESSION OF HEART CATHETERIZATION:   1. Normal left main coronary artery. 2. Normal left anterior descending artery and its branches. 3. Normal left circumflex artery and its branches. 4. Normal right coronary artery. 5. Normal left ventricular systolic function.  LVEDP 20 mmHg.  Ejection fraction 60%.  RECOMMENDATION:    Medical treatment.

## 2012-08-25 NOTE — Discharge Summary (Signed)
Physician Discharge Summary  Patient ID: BHUMI KERSTIENS MRN: [id number] DOB/AGE: 1945-04-24 67 y.o.  Admit date: 08/23/2012 Discharge date: 08/25/2012  Admission Diagnoses: Chest pain r/o MI  ESRD  HTN  DM, II  Discharge Diagnoses:  Principle Problem:  * Chest pain, non-cardiac*  ESRD  HTN  DM, II Anemia of chronic disease  Discharged Condition: good  Hospital Course: 67 years old female with recurrent left sided chest pain without radiation, more so during dialysis session and without fever or cough. Her nuclear stress test showed inferior and apical defects with questionable reversibility. She had cardiac cath with normal coronaries. She had hemodialysis post procedure and was discharged home in stable condition.  Consults: cardiology  Significant Diagnostic Studies: labs: Normal electrolytes and WBC and platelets count. Low Hgb of 10.8 and elevated BUN/Cr. of 70/9.6  EKG-SR, Non-specific ST-T wave abnormality.  Cardiac cath-normal coronaries.  Treatments: cardiac meds: carvedilol, amlodipine, crestor and aspirin.  Discharge Exam: Blood pressure 134/64, pulse 73, temperature 99.1 F (37.3 C), temperature source Oral, resp. rate 18, height 5\' 5"  (1.651 m), weight 116.7 kg (257 lb 4.4 oz), SpO2 97.00%. HEENT: [place]/AT, Eyes-Brown, PERL, EOMI, Conjunctiva-Pale pink, Hazy right cornea. Sclera- Non-icteric  Neck: No JVD, No bruit, Trachea midline.  Lungs: Clear, Bilateral.  Cardiac: Regular rhythm, normal S1 and S2, no S3.  Abdomen: Soft, non-tender.  Extremities: No edema present. No cyanosis. No clubbing.  CNS: AxOx3, Cranial nerves grossly intact, moves all 4 extremities. Right handed.  Skin: Warm and dry.   Disposition: 01-Home or Self Care   Future Appointments Provider Department Dept Phone   09/03/2012 8:45 AM [facility] [phone]   Patient should wear two piece clothing and wear no powder or deodorant. Patient should  arrive 15 minutes early.       Medication List         acetaminophen 500 MG tablet  Commonly known as:  TYLENOL  Take 500 mg by mouth every 6 (six) hours as needed. For pain     amLODipine 10 MG tablet  Commonly known as:  NORVASC  Take 10 mg by mouth daily.     aspirin EC 81 MG tablet  Take 81 mg by mouth daily.     bisacodyl 5 MG EC tablet  Commonly known as:  DULCOLAX  Take 5 mg by mouth daily as needed. For constipation     CALCIUM 600 + D PO  Take 1 tablet by mouth daily.     carvedilol 25 MG tablet  Commonly known as:  COREG  Take 25 mg by mouth 2 (two) times daily with a meal.     ferrous sulfate 325 (65 FE) MG tablet  Take 325 mg by mouth daily with breakfast.     hydrALAZINE 50 MG tablet  Commonly known as:  APRESOLINE  Take 50 mg by mouth 3 (three) times daily.     HYDROcodone-acetaminophen 5-325 MG per tablet  Commonly known as:  NORCO/VICODIN  Take 1-2 tablets by mouth every 6 (six) hours as needed for pain.     indomethacin 50 MG capsule  Commonly known as:  INDOCIN  Take 50 mg by mouth 2 (two) times daily as needed (for gout pain).     insulin glargine 100 UNIT/ML injection  Commonly known as:  LANTUS  Inject 10 Units into the skin at bedtime.     isosorbide mononitrate 60 MG 24 hr tablet  Commonly known as:  IMDUR  Take  60 mg by mouth daily.     metoprolol succinate 50 MG 24 hr tablet  Commonly known as:  TOPROL-XL  Take 50 mg by mouth daily.     multivitamin with minerals Tabs tablet  Take 1 tablet by mouth daily.     nitroGLYCERIN 0.4 MG SL tablet  Commonly known as:  NITROSTAT  Place 0.4 mg under the tongue every 5 (five) minutes as needed for chest pain.     rosuvastatin 20 MG tablet  Commonly known as:  CRESTOR  Take 20 mg by mouth daily.     sevelamer carbonate 800 MG tablet  Commonly known as:  RENVELA  Take 2,400 mg by mouth 3 (three) times daily with meals.           Follow-up Information   Follow up with  Salena Saner., MD. Schedule an appointment as soon as possible for a visit in 1 month.   Contact information:   [place] [address] [phone]       Follow up with [facility], MD. Schedule an appointment as soon as possible for a visit in 1 month.   Contact information:   [place] [address] [phone]       Signed: Birdie Riddle 08/25/2012, 5:54 PM

## 2012-09-03 ENCOUNTER — Ambulatory Visit (HOSPITAL_COMMUNITY): Payer: Medicare HMO

## 2012-09-03 ENCOUNTER — Ambulatory Visit
Admission: RE | Admit: 2012-09-03 | Discharge: 2012-09-03 | Disposition: A | Discharge status: Home / Self Care | Payer: Medicare HMO | Source: Ambulatory Visit | Attending: Internal Medicine | Admitting: Internal Medicine

## 2012-09-03 DIAGNOSIS — Z1231 Encounter for screening mammogram for malignant neoplasm of breast: Secondary | ICD-10-CM

## 2013-05-14 IMAGING — CR DG CHEST 2V
2 series · 2 of 2 positions shown · non-contrast
Comparison: Chest x-ray 12/18/2010.

CLINICAL DATA: Preoperative evaluation for Diatek catheter
placement.

CHEST - 2 VIEW

[w chest pa]
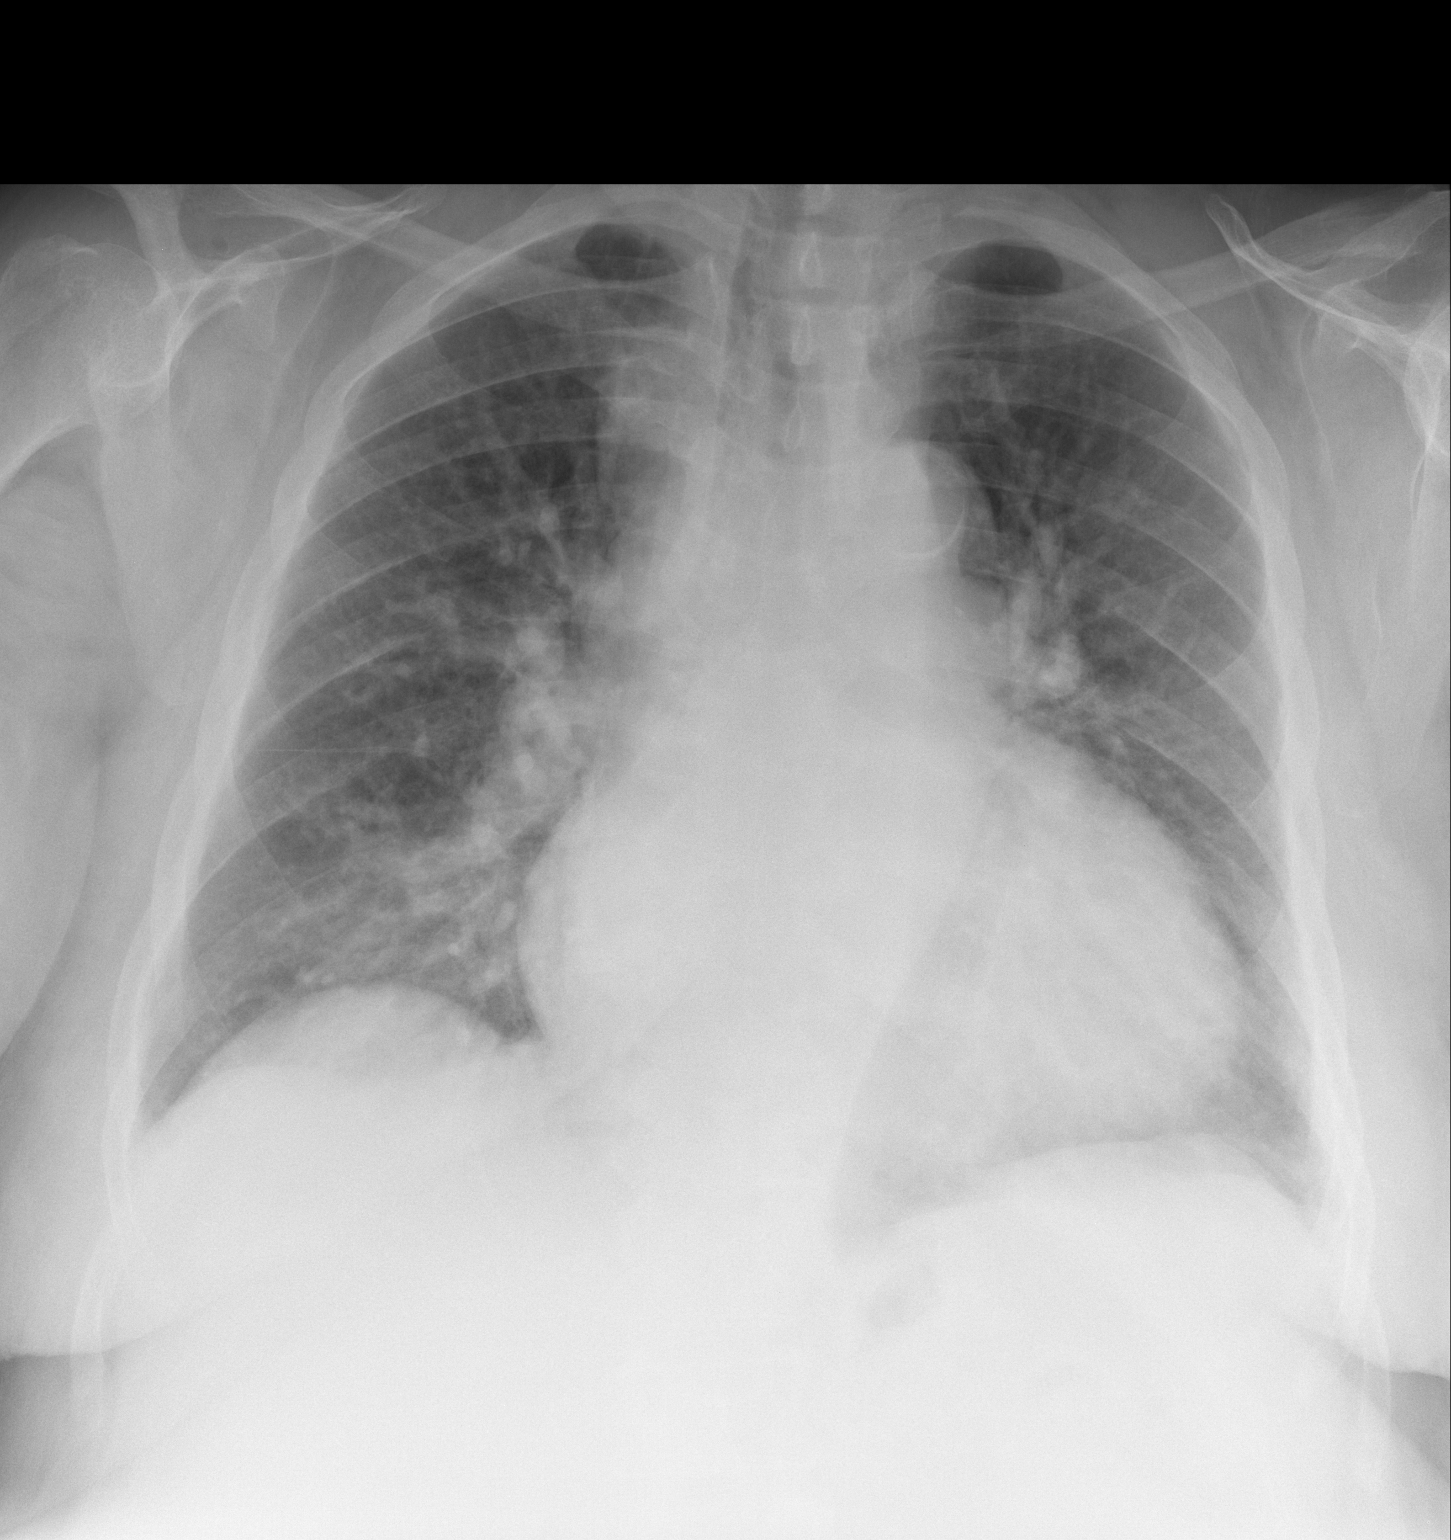

[w chest lat]
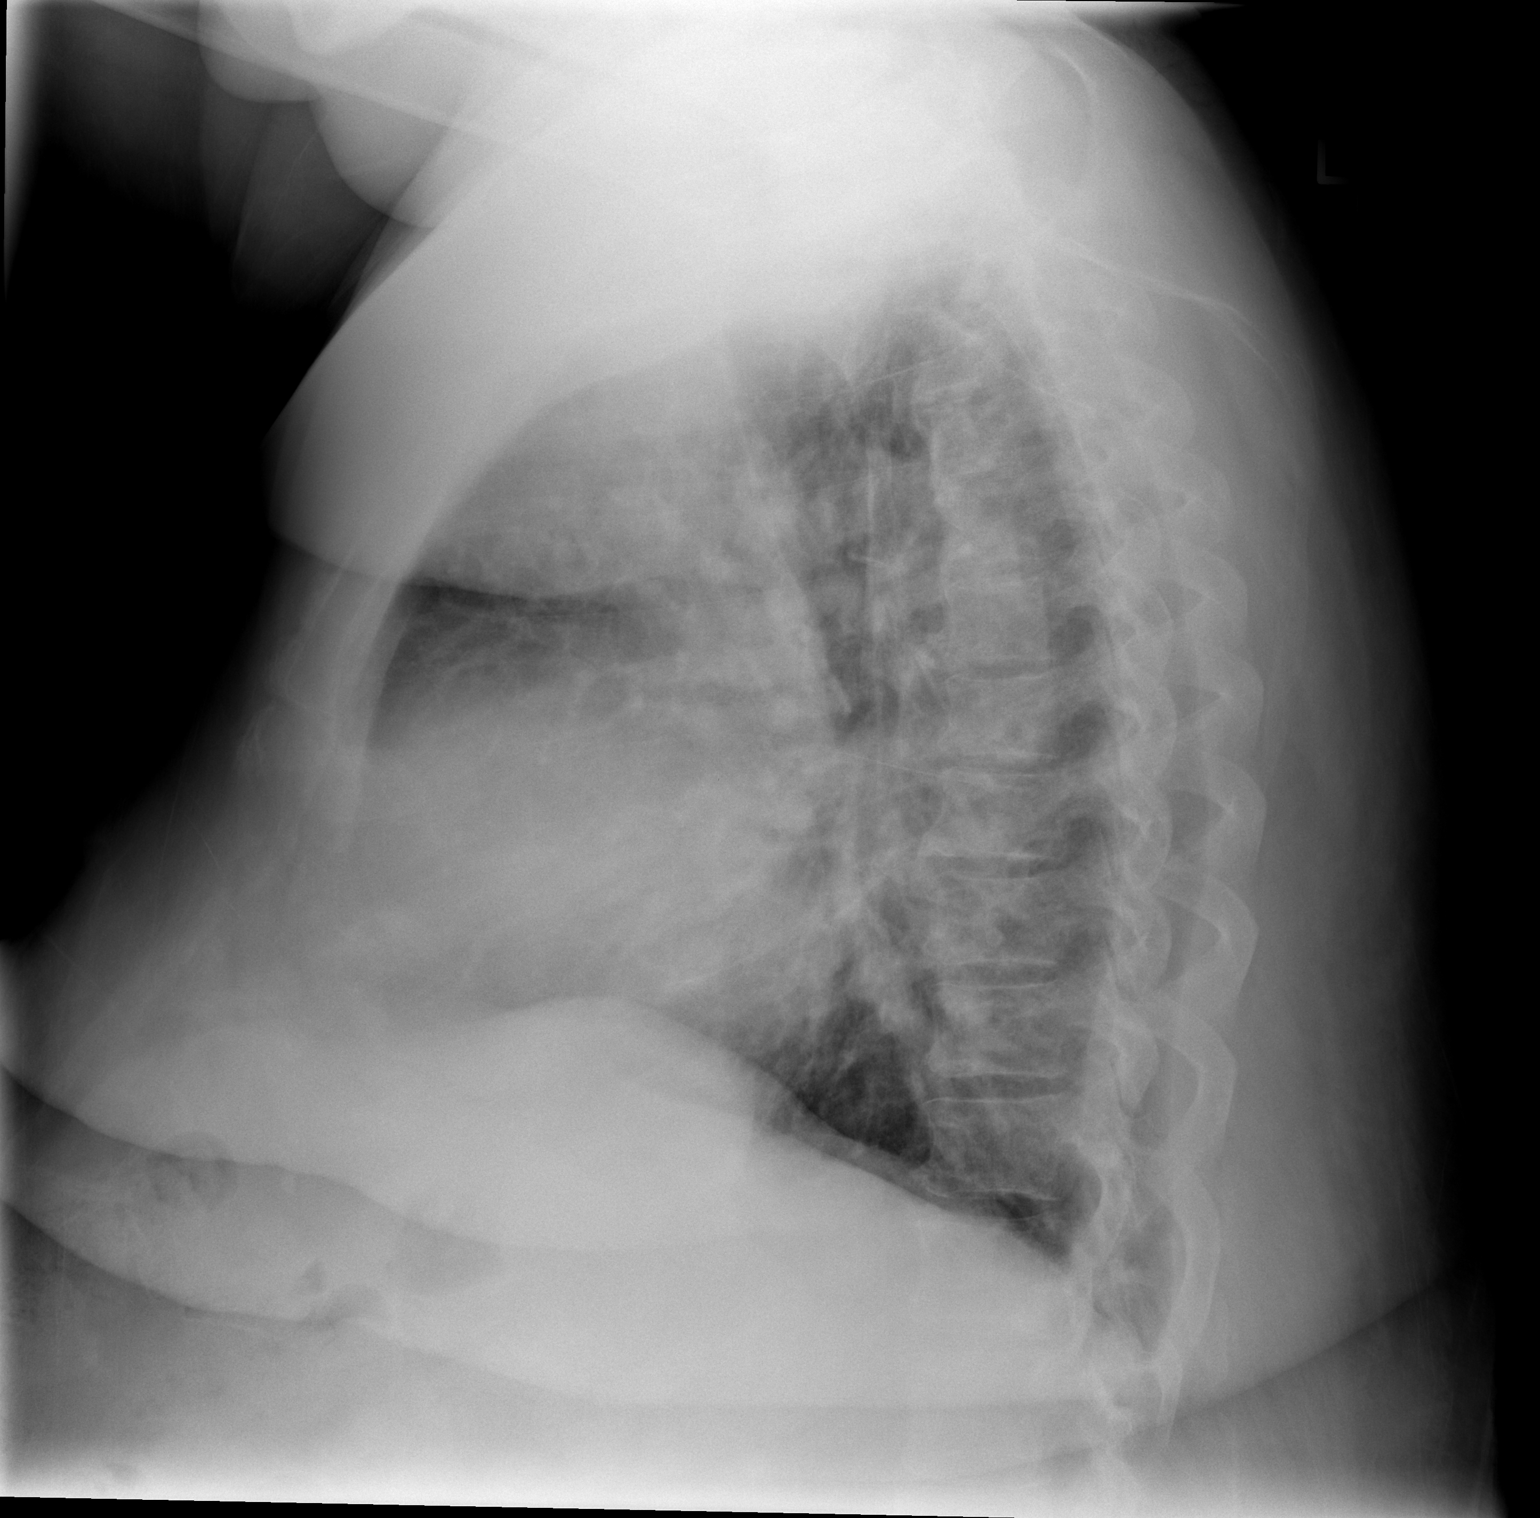

[2 of 2 positions shown; findings below may reference images not displayed]

FINDINGS: Lung volumes are normal.  No consolidative airspace
disease.  No definite to pleural effusions.  Cephalization of the
pulmonary vasculature, without frank pulmonary edema.  Mild -
moderate cardiomegaly (increased).  Upper mediastinal contours are
unremarkable.  Atherosclerosis in the thoracic aorta.
IMPRESSION: 1.  Cardiomegaly with pulmonary venous congestion, but no frank
pulmonary edema.
2.  Atherosclerosis.

## 2013-05-14 IMAGING — CR DG CHEST 1V PORT
1 series · 1 of 1 positions shown · non-contrast
Comparison: Two-view chest 01/05/2012.

CLINICAL DATA: Status post diatek catheter placement.

PORTABLE CHEST - 1 VIEW

[AP]
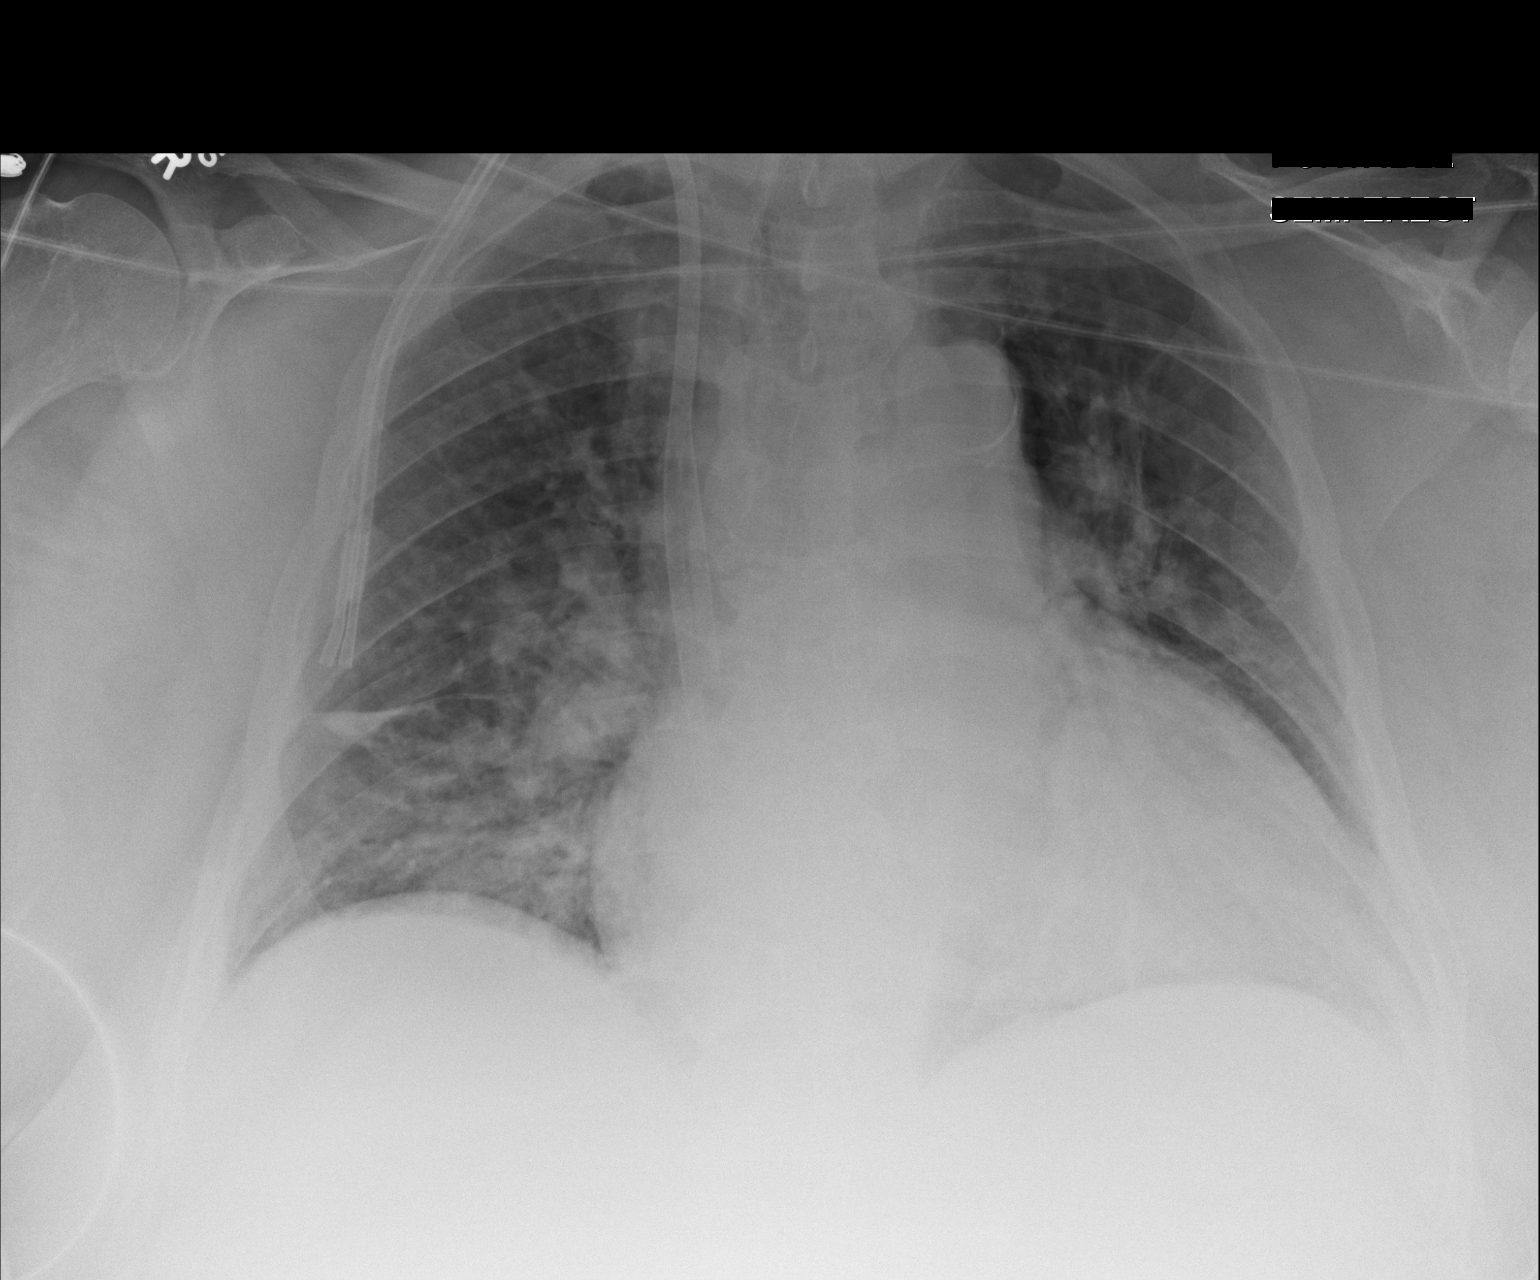

[1 of 1 positions shown; findings below may reference images not displayed]

FINDINGS: A new right IJ dialysis catheter has been placed.  The
distal limb is in the right atrium. There is no pneumothorax.  The
heart is enlarged.  Pulmonary vascular congestion and edema have
increased. No focal airspace disease evident.  There is new fluid
within the right minor fissure. The visualized soft tissues and
bony thorax are unremarkable.
IMPRESSION: 1.  Interval placement of right IJ dialysis catheter.  There is no
radiographic evidence for complication.
2.  Cardiomegaly with increased interstitial edema has worsened,
compatible with congestive heart failure.  New fluid is present in
the minor fissure.

## 2013-07-28 ENCOUNTER — Other Ambulatory Visit (HOSPITAL_COMMUNITY): Payer: Self-pay | Admitting: Internal Medicine

## 2013-07-28 DIAGNOSIS — Z1231 Encounter for screening mammogram for malignant neoplasm of breast: Secondary | ICD-10-CM

## 2013-08-24 IMAGING — XA IR REMOVAL TUNNELED CV CATH
1 series · 1 of 1 positions shown · non-contrast
Comparison: none

INDICATION: Functioning dialysis graft, no longer in need of
dialysis catheter

[Series 1: fl cto · 1 of 1 slices shown]
[im 1/1]
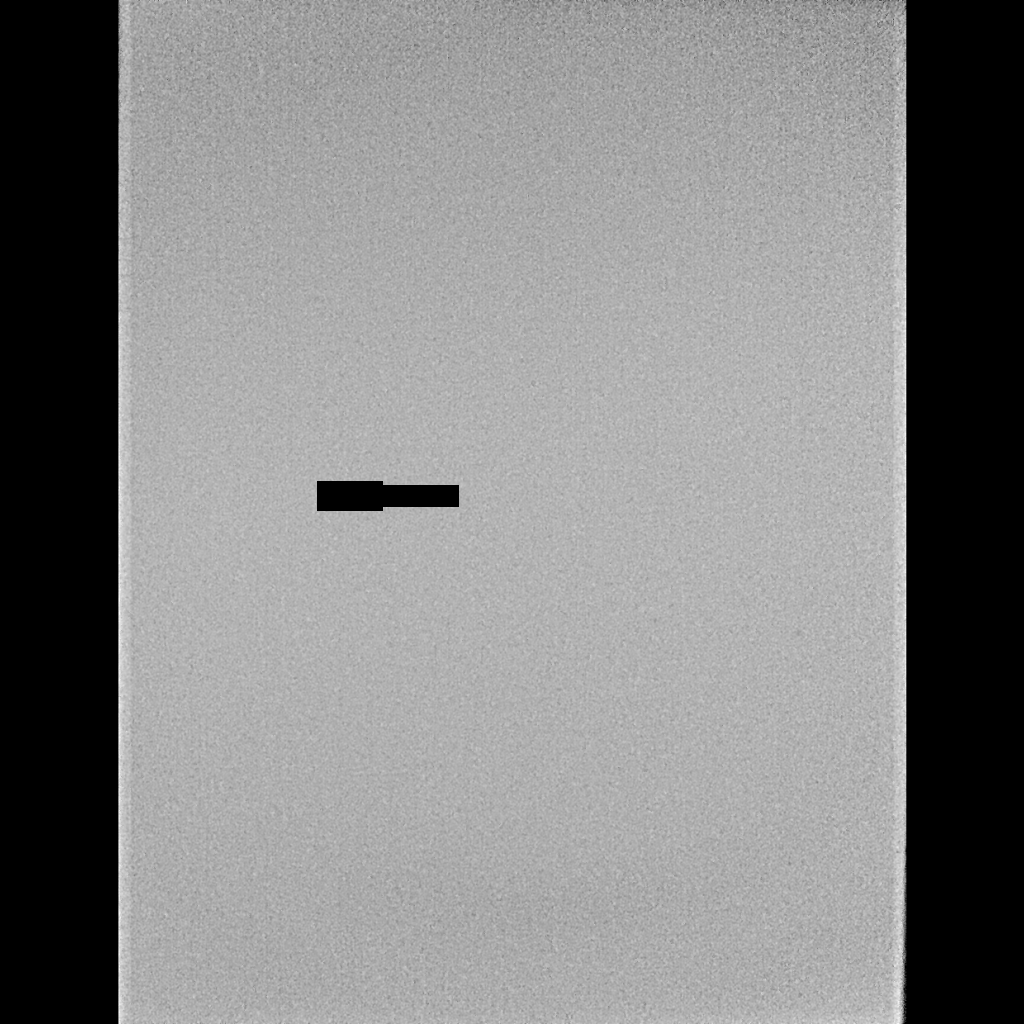

[1 of 1 positions shown; findings below may reference images not displayed]

REMOVAL OF TUNNELED HEMODIAYLISIS CATHETER

Intravenous medications: None

Complications: None immediate

PROCEDURE / FINDINGS:

Informed written consent was obtained from the patient  following
an explanation of the procedure, risks, benefits and alternatives
to treatment.  A time out was performed prior to the initiation of
the procedure.

Maximal barrier sterile technique was utilized including caps,
mask, sterile gowns, sterile gloves, large sterile drape, hand
hygiene, and Betadine.

1% lidocaine with epinephrine was injected under sterile conditions
along the subcutaneous tunnel.  Utilizing gentle traction, the
catheter was removed intact.  Hemostasis was obtained with manual
compression. A dressing was placed.  The patient tolerated the
procedure well without immediate post procedural complication.
IMPRESSION: Successful removal of tunneled dialysis catheter.

## 2013-09-05 ENCOUNTER — Ambulatory Visit (HOSPITAL_COMMUNITY): Payer: Medicare HMO

## 2013-09-05 ENCOUNTER — Ambulatory Visit (HOSPITAL_COMMUNITY)
Admission: RE | Admit: 2013-09-05 | Discharge: 2013-09-05 | Disposition: A | Discharge status: Home / Self Care | Payer: Medicare HMO | Source: Ambulatory Visit | Attending: Internal Medicine | Admitting: Internal Medicine

## 2013-09-05 DIAGNOSIS — Z1231 Encounter for screening mammogram for malignant neoplasm of breast: Secondary | ICD-10-CM | POA: Insufficient documentation

## 2013-09-14 IMAGING — CT CT ABD-PELV W/O CM
3 of 4 series · 15 of 32 positions shown, 19 images · non-contrast
Comparison: 05/20/2011

CLINICAL DATA: Spontaneous bleeding at dialysis fistula PICC line
site. Recent ventral hernia repair.  Evaluate for retroperitoneal
hemorrhage. End-stage renal disease.

CT ABDOMEN AND PELVIS WITHOUT CONTRAST
TECHNIQUE: Multidetector CT imaging of the abdomen and pelvis was
performed following the standard protocol without intravenous
contrast.

[Series 2: abd w/o · axial · non-contrast · 0.96mm/px · z∈[-401,-176]mm · 3 of 91 slices shown, 7 images]
[im 23/91  soft-tissue]
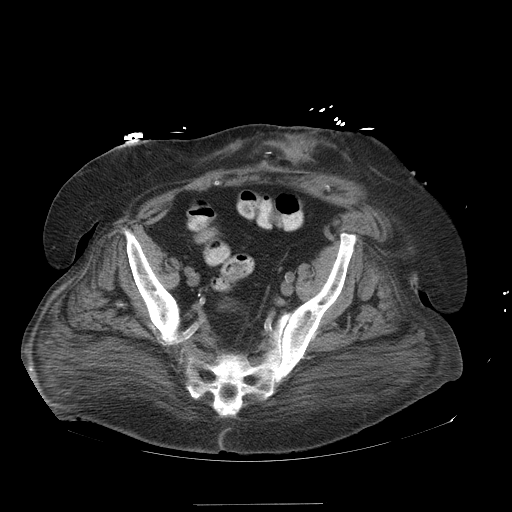
[im 23/91  lung]
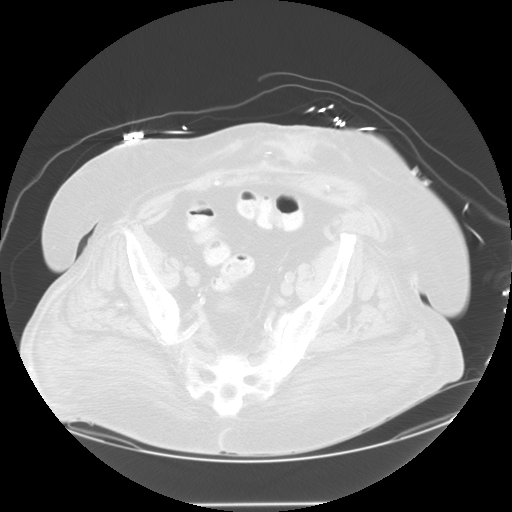
[im 23/91  bone]
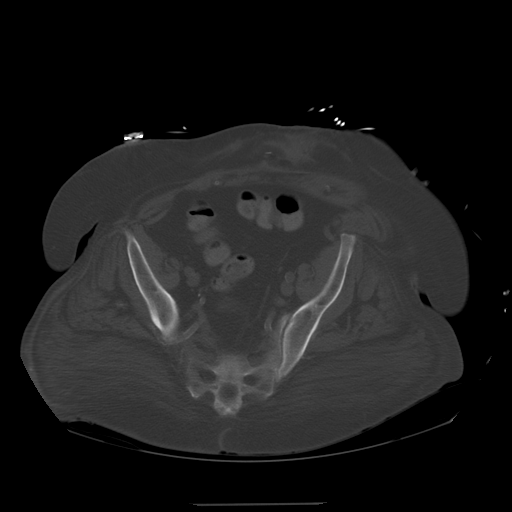
[im 46/91  soft-tissue]
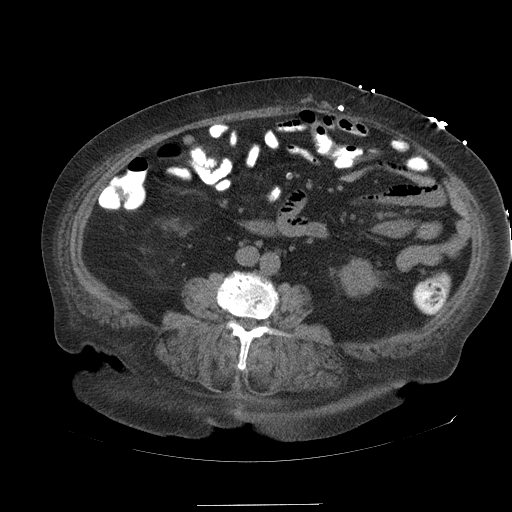
[im 46/91  lung]
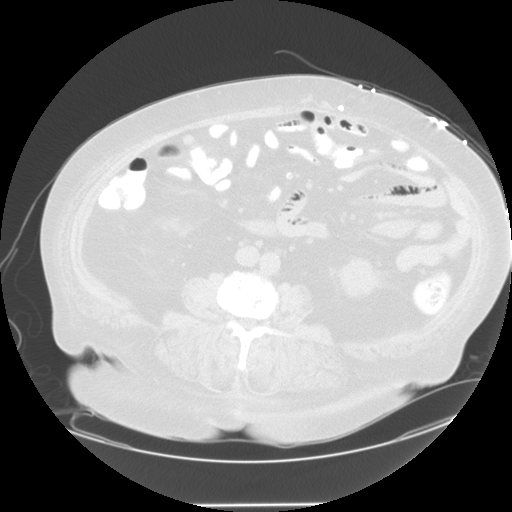
[im 68/91  soft-tissue]
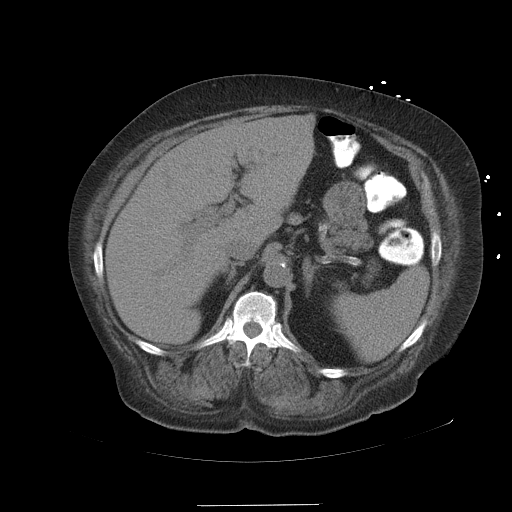
[im 68/91  lung]
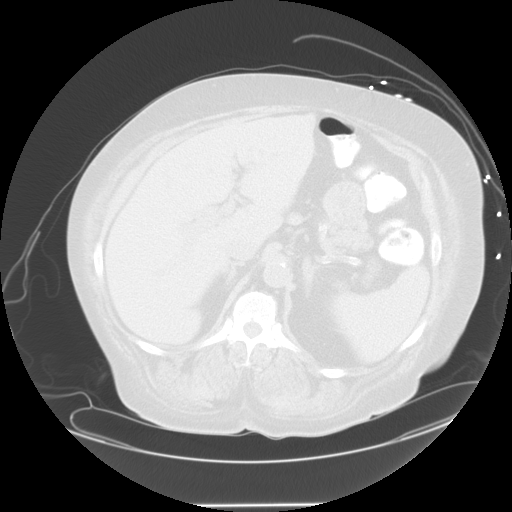

[Series 400: coronals · coronal · 0.96mm/px · 4 of 176 slices shown]
[im 18/176  soft-tissue]
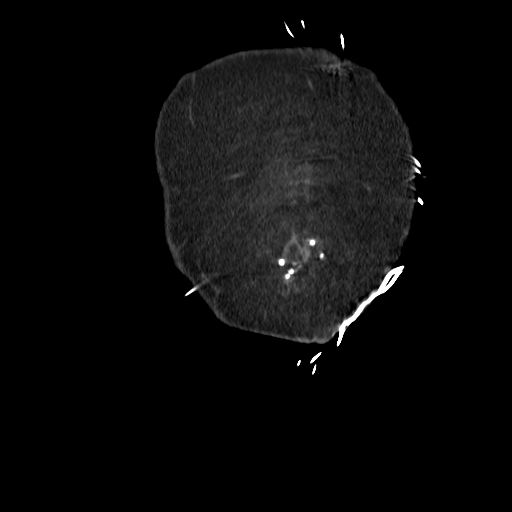
[im 36/176  soft-tissue]
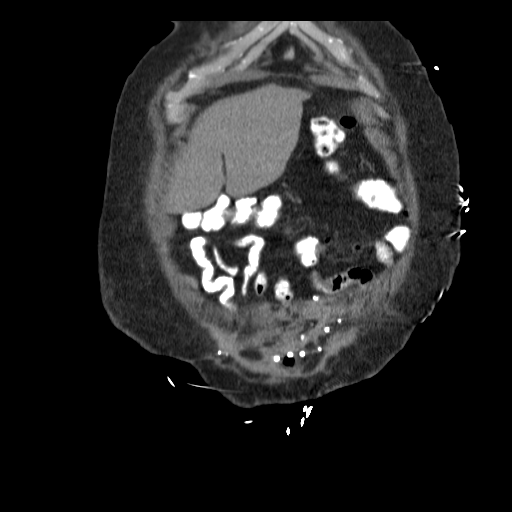
[im 53/176  soft-tissue]
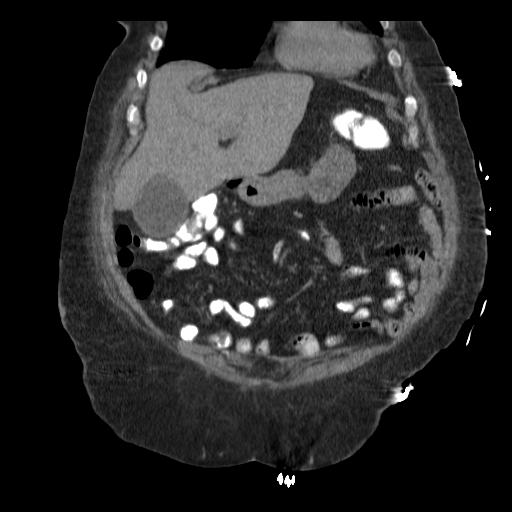
[im 71/176  soft-tissue]
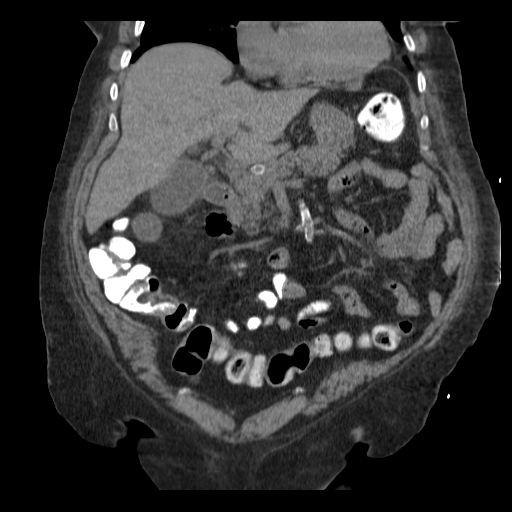

[Series 401: sagittals · sagittal · 0.96mm/px · 8 of 214 slices shown]
[im 17/214  soft-tissue]
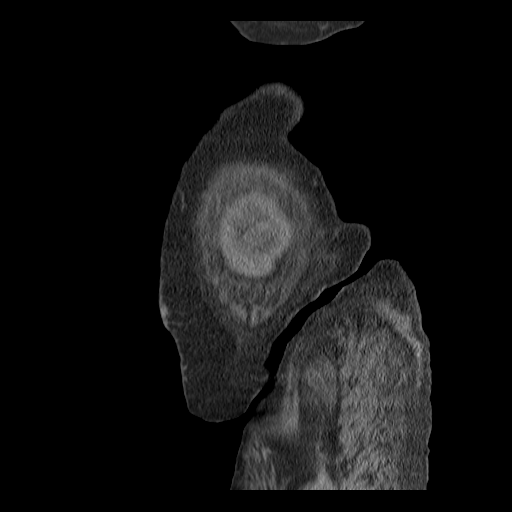
[im 50/214  soft-tissue]
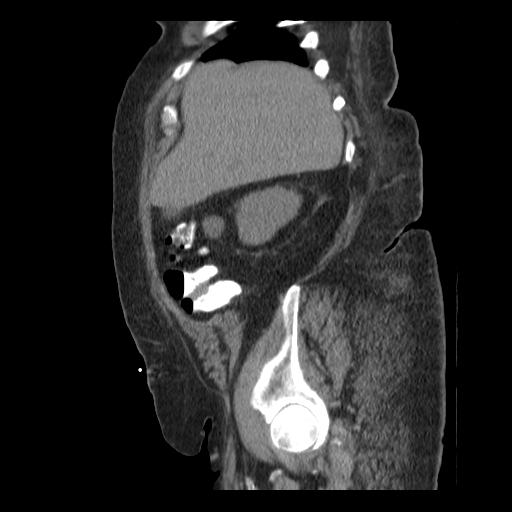
[im 66/214  soft-tissue]
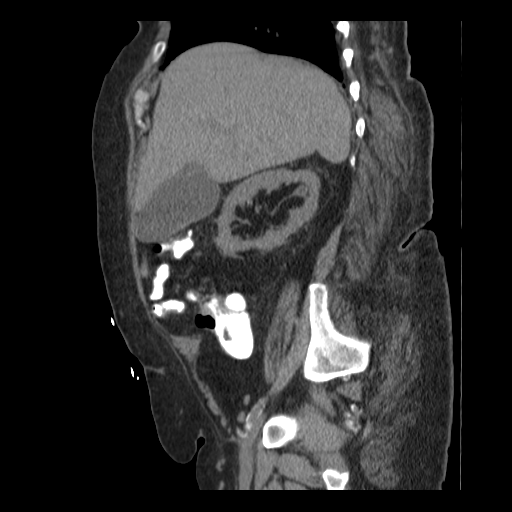
[im 99/214  soft-tissue]
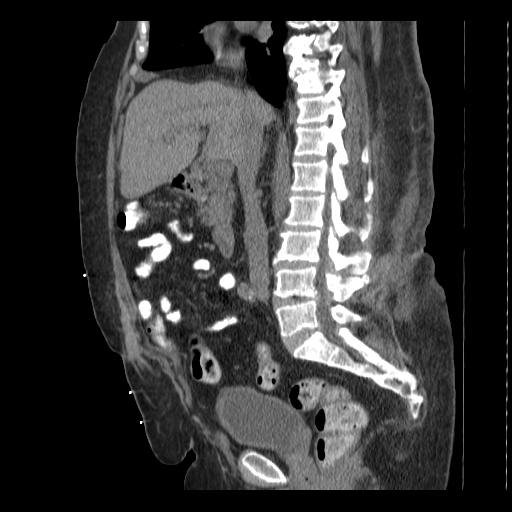
[im 115/214  soft-tissue]
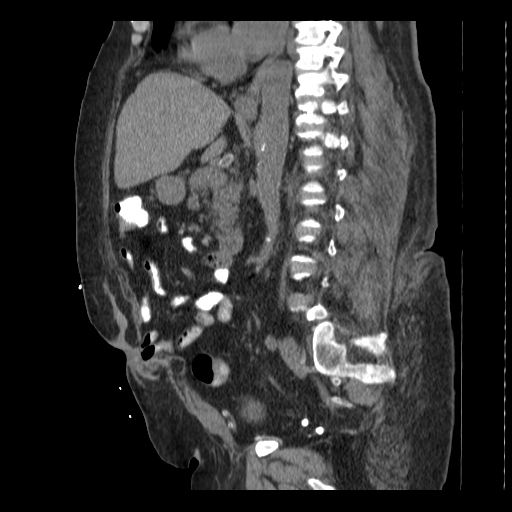
[im 148/214  soft-tissue]
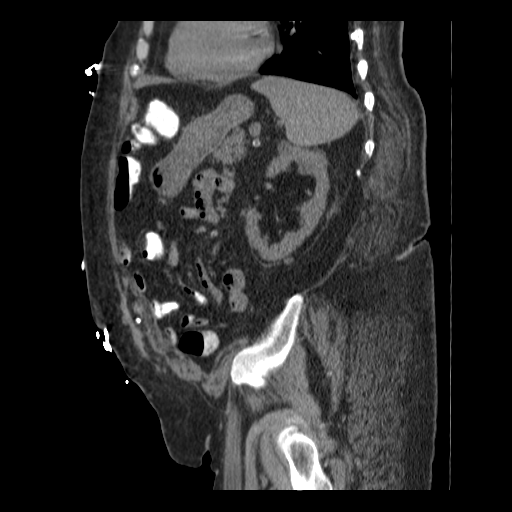
[im 164/214  soft-tissue]
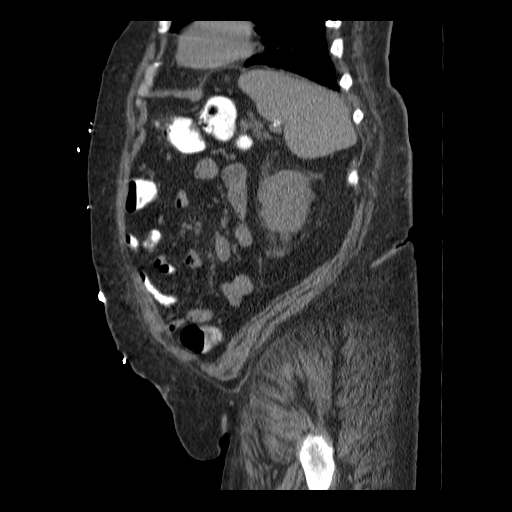
[im 197/214  soft-tissue]
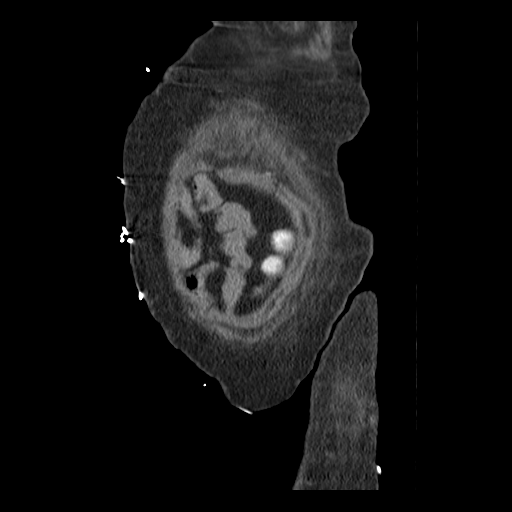

[15 of 32 positions shown; findings below may reference images not displayed]

FINDINGS: Postop changes are seen from ventral hernia repair with
further healing of surgical wound.  A small residual ventral hernia
containing several small bowel loops is seen along the inferior
aspect of the surgical mesh.  No evidence of bowel obstruction.
There is no evidence of abdominal wall or retroperitoneal hematoma.
No evidence of hemoperitoneum or free air.

A small hiatal hernia is again seen.  Noncontrast images of the
liver, gallbladder, pancreas, spleen, and adrenal glands are normal
appearance.  Small renal cyst appears stable and there is no
evidence of renal calculi or hydronephrosis.

Prior hysterectomy noted.  No soft tissue masses or lymphadenopathy
identified.  No evidence of acute inflammatory process or abscess.
IMPRESSION: 1.  No acute findings.
2.  Small residual ventral hernia containing small bowel loops
along the inferior margin of the surgical mesh.
3.  Small hiatal hernia.

## 2013-09-14 IMAGING — CR DG CHEST 1V PORT
1 series · 1 of 1 positions shown · non-contrast
Comparison: Chest radiograph performed 01/05/2012

CLINICAL DATA: Vomiting; bleeding from PICC site.

PORTABLE CHEST - 1 VIEW

[AP]
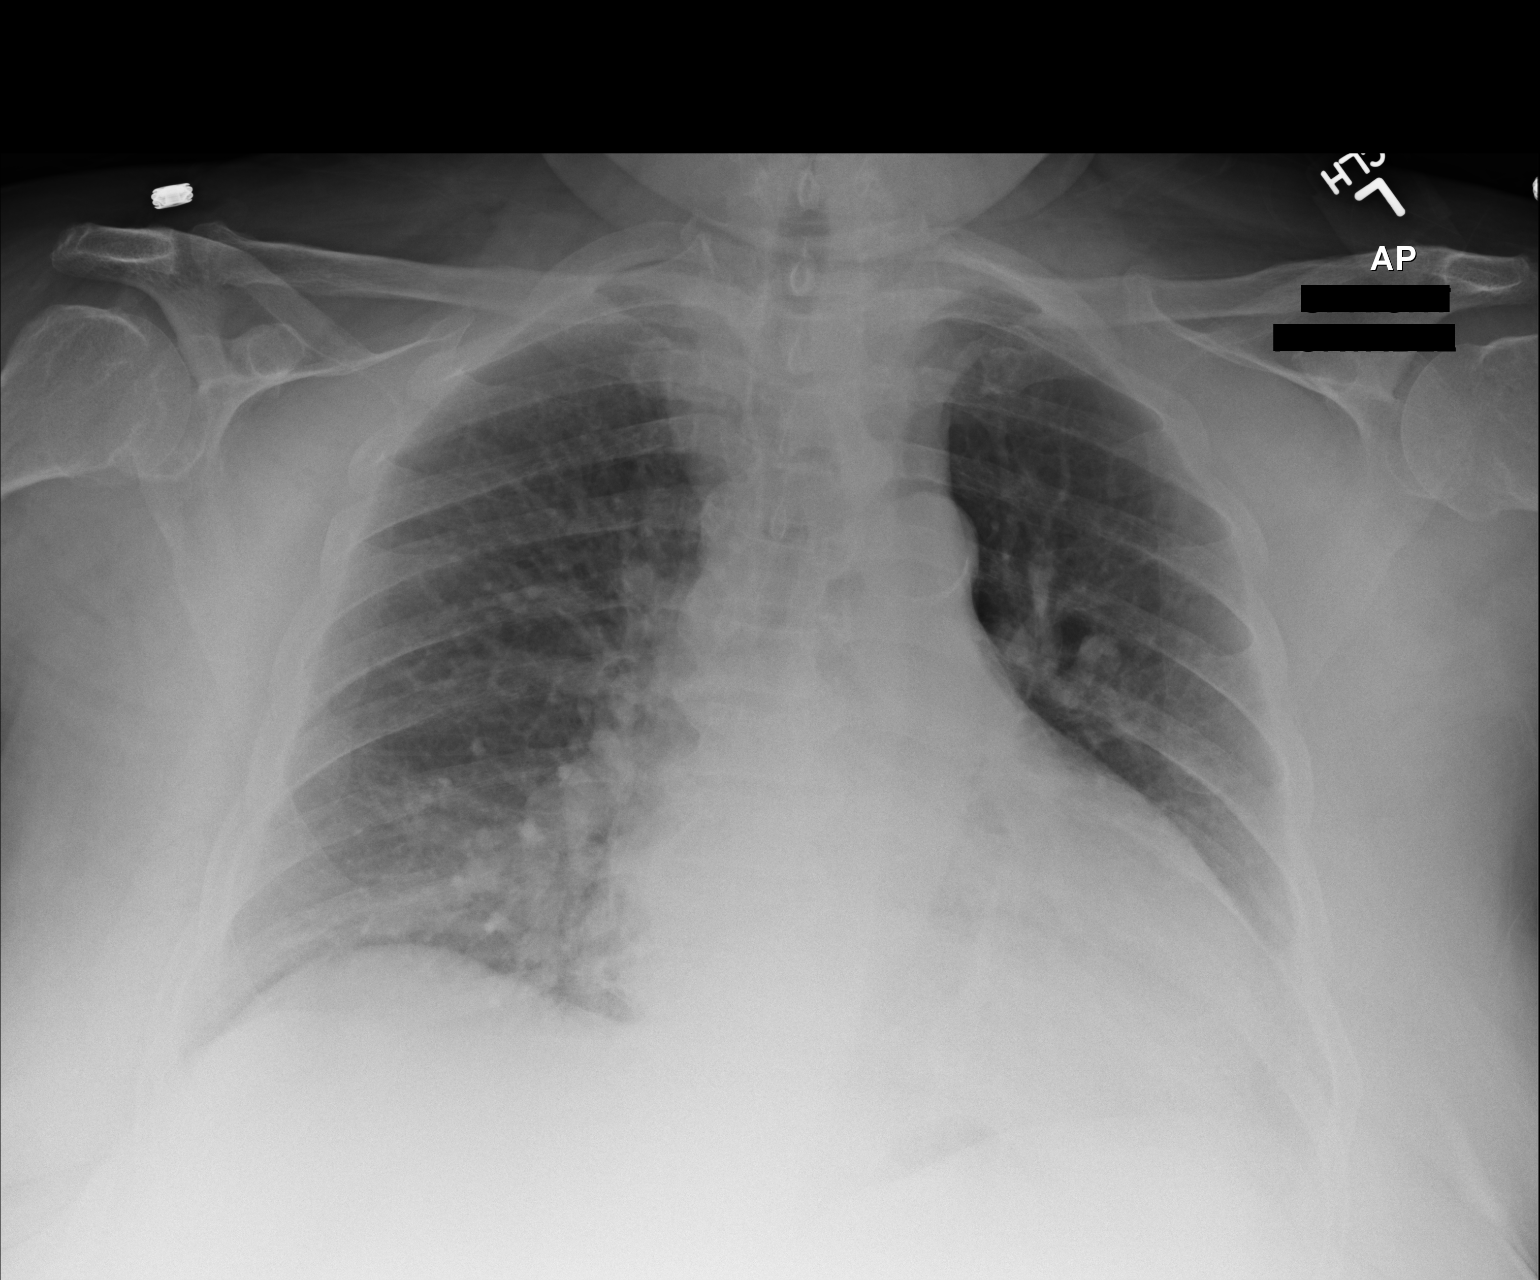

[1 of 1 positions shown; findings below may reference images not displayed]

FINDINGS: The lungs are relatively well expanded.  Mild vascular
congestion is seen.  Mild bibasilar opacities could reflect minimal
interstitial edema.  No pleural effusion or pneumothorax is seen.

The cardiomediastinal silhouette is borderline enlarged.
Calcification is noted within the aortic arch.  No acute osseous
abnormalities are identified.
IMPRESSION: Mild vascular congestion and borderline cardiomegaly; mild
bibasilar opacities could reflect minimal interstitial edema,
though much less prominent than on the prior study.

## 2013-09-19 IMAGING — XA IR SHUNTOGRAM/ FISTULAGRAM
1 series · 13 of 13 positions shown · non-contrast
Comparison: none

IR FISTULOGRAM LEFT; IR FLUORO GUIDED TUNNELED CENTRAL
VENOUSCATHETER:  IR ULTRASOUND GUIDED VASCULAR ACCESS RIGHT

Date: 05/12/2012
CLINICAL HISTORY: 67-year-old female with end-stage renal disease
on hemodialysis via a left upper extremity arteriovenous fistula.
The fistula was created in December 2011 and has been in use since
January 2012.  She has experienced difficulties with accessing of
the fistula and her most recent hemodialysis session was limited
with some evidence of clots.  She presents interventional radiology
today for possible clotted arteriovenous fistula.

[Series 1: run · 13 of 13 slices shown]
[im 1/13]
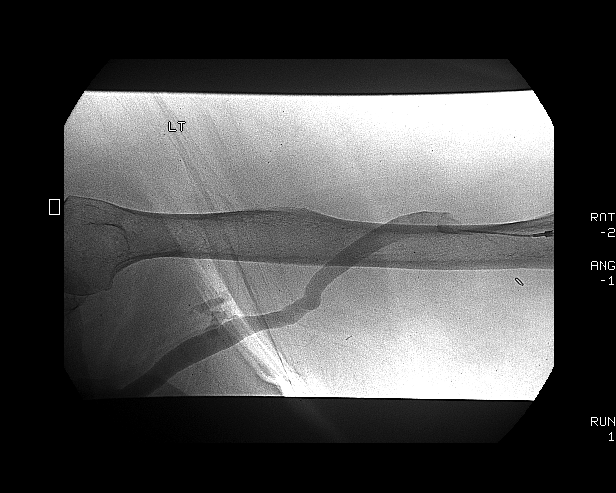
[im 2/13]
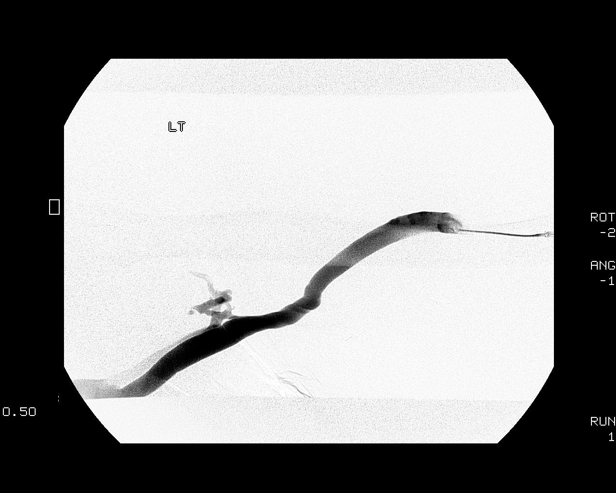
[im 3/13]
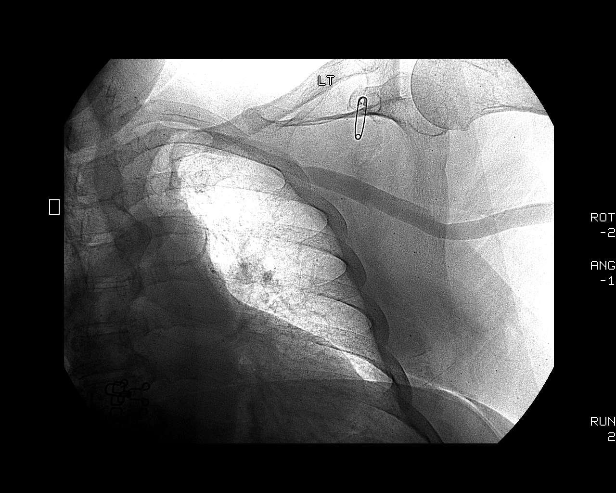
[im 4/13]
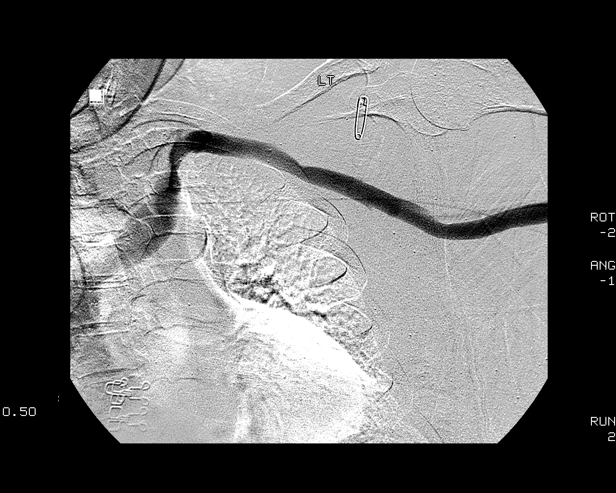
[im 5/13]
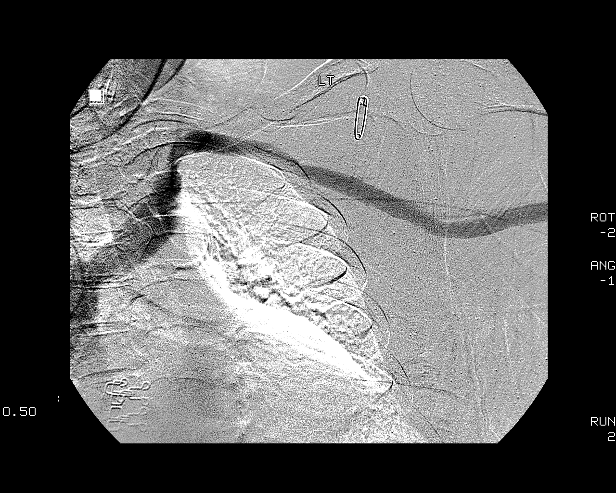
[im 6/13]
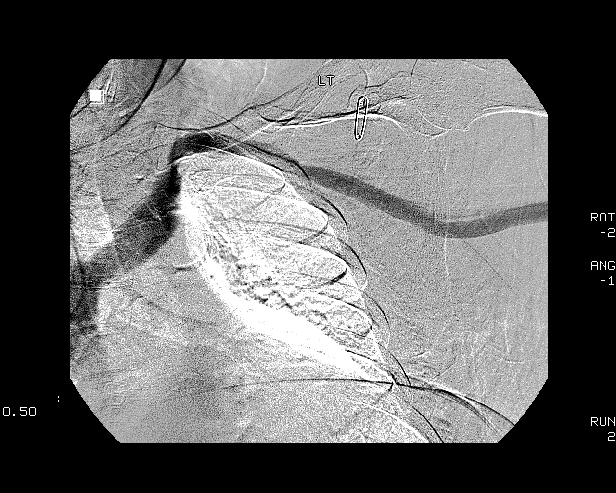
[im 7/13]
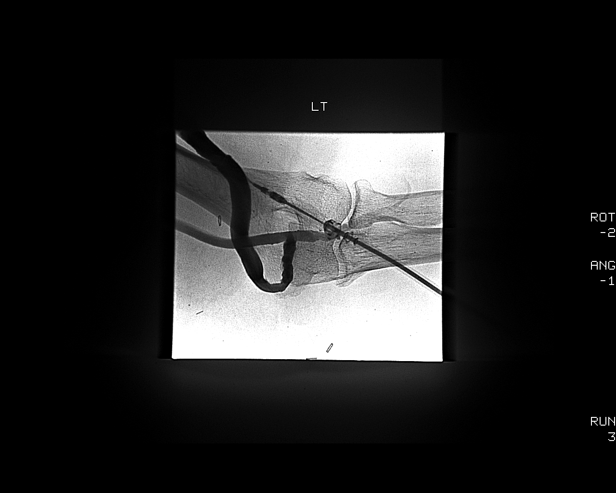
[im 8/13]
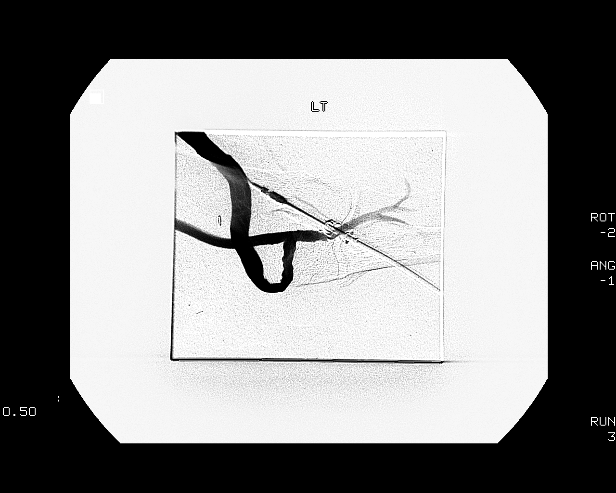
[im 9/13]
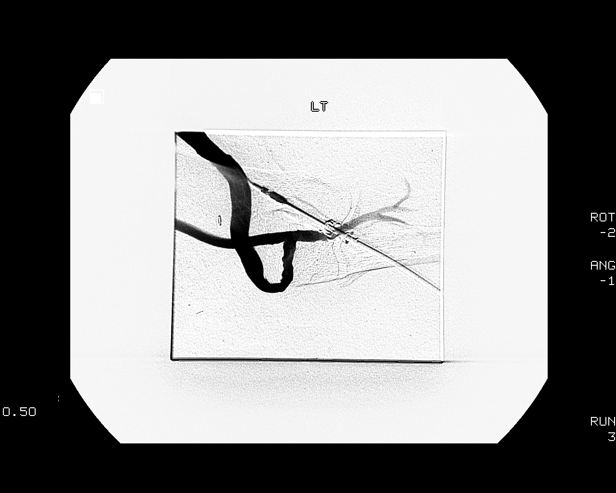
[im 10/13]
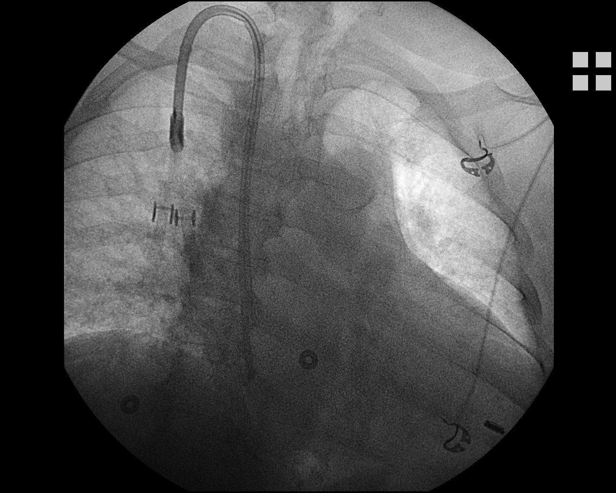
[im 11/13]
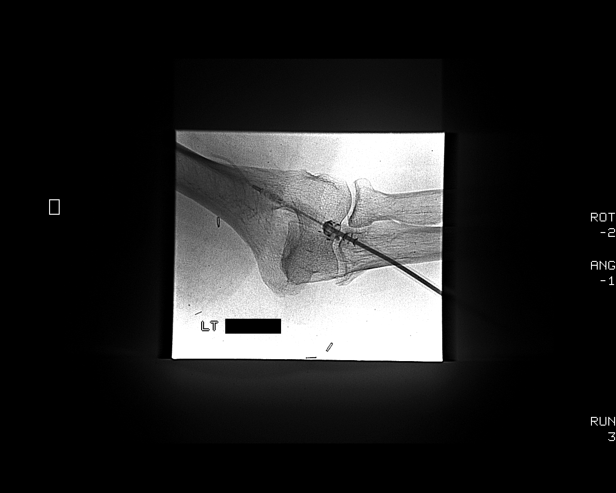
[im 12/13]
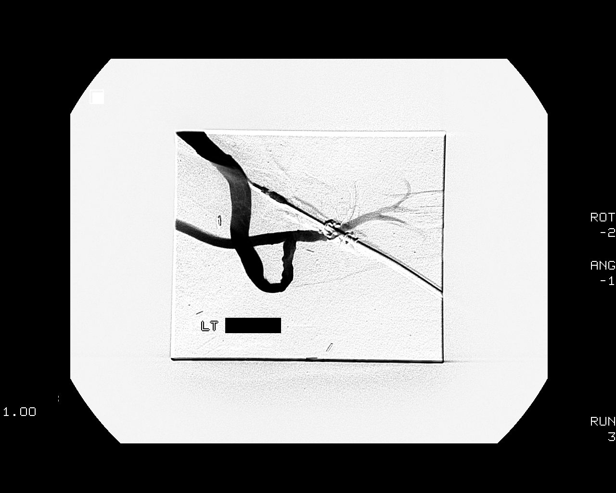
[im 13/13]
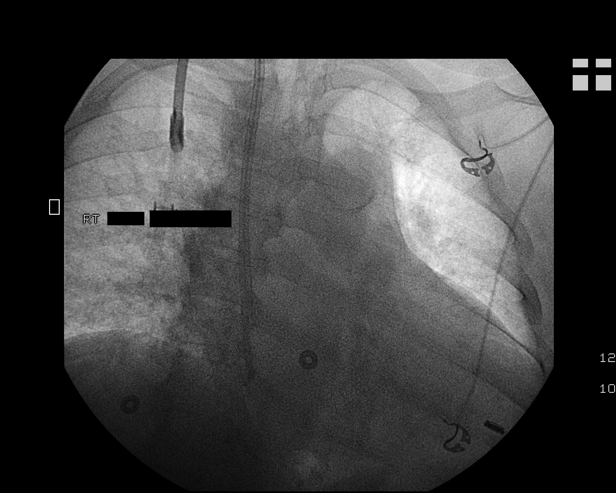

[13 of 13 positions shown; findings below may reference images not displayed]

On physical exam, the fistula demonstrates a palpable thrill.  Her
arm is diffusely ecchymotic and a lumpy.  Bedside sonographic
evaluation demonstrates a patent but deeply situated fistula with
extensive adjacent hematoma.  There are several lobulated
pseudoaneurysms which are thrombosed.  The draining vein is fairly
tortuous.

We will proceed with diagnostic fistulogram.

Procedures Performed:
1. Diagnostic fistulogram
2.  Ultrasound-guided puncture of the right internal jugular vein
3.  Placement of a tunneled hemodialysis catheter using
fluoroscopic guidance

Sedation: Moderate (conscious) sedation was used.  One mg Versed,
50 mcg Fentanyl were administered intravenously.  The patient's
vital signs were monitored continuously by radiology nursing
throughout the procedure.

Sedation Time: 20 minutes

Fluoroscopy time: 2 minutes 42 seconds

Contrast volume: 30 ml Dmnipaque-UHH

Antibiotic Prophylaxis:  2 grams and set administered intravenously
within 1 hour of skin incision

PROCEDURE/FINDINGS:

 Informed consent was obtained from the patient following
explanation of the procedure, risks, benefits and alternatives.
The patient understands, agrees and consents for the procedure.
All questions were addressed. A time out was performed.

Maximal barrier sterile technique utilized including caps, mask,
sterile gowns, sterile gloves, large sterile drape, hand hygiene,
and chlorhexidine skin prep.

The left upper extremity arteriovenous fistula was accessed with an
18 gauge angiocatheter.  Diagnostic fistulogram was performed.  The
access is a brachiocephalic arteriovenous fistula.  The arterial
anastomosis is widely patent.  The draining vein is mildly tortuous
but patent.  The cephalic arch is patent.  The central veins are
patent without evidence of central stenosis.

Although the fistula demonstrates no focal abnormality, the
extensive hematoma in the soft tissues surrounding the draining
vein in the left upper arm makes percutaneous access quite
difficult.  I discussed this case with Mishka Lubana, the
nephrology PA, and we are in agreement that the patient could
benefit from a right IJ tunneled hemodialysis catheter.  She can
dialyze through the catheter for the next 2-4 weeks on her left arm
heals.

Attention was then turned to the right neck.  The skin was prepped
and draped in a standard sterile fashion using chlorhexidine.  The
right neck was interrogated with ultrasound.  The right internal
jugular vein is small but patent. Local anesthesia was achieved by
infiltration of 1% lidocaine.  A small dermatotomy was made with
the #11 blade.  Under direct sonographic guidance, the internal
jugular vein was punctured with a 21-gauge micropuncture needle.
An image was obtained stored for the medical record.  The
transitional micro sheath was advanced over a micro wire and
positioned in the superior vena cava.  The micro wire was used to
obtain the intravascular length.  An appropriately sized tunneled
hemodialysis catheter was then selected.

A suitable skin exit site inferior to the clavicle was selected and
marked.  Local anesthesia was achieved by infiltration of 1%
lidocaine.  A small dermatotomy was made with a #11 blade.  A 23 cm
tip to cuff HemoSplit hemodialysis catheter was then tunneled from
the skin exit site through the subcutaneous tissues and out the
dermatotomy overlying the venous access site.  The soft tissues
from the venous access site to the internal jugular vein was then
serially dilated and ultimately a peel-away sheath was advanced
into the right atrium.  The catheter was then advanced through the
peel-away sheath and positioned with its tip in the mid right
atrium under fluoroscopic guidance.  The catheter flushed and
aspirated with ease.  It was then locked with 0555 unit/ml
heparinized saline and secured to the skin with O Prolene suture.
A sterile bandage was placed.

There was no immediate complication, the patient tolerated the
procedure well.
IMPRESSION: 1.  Physical and bedside sonographic examination demonstrate a
patent but deeply situated left upper extremity brachiocephalic
arteriovenous fistula with extensive surrounding hematoma and
thrombosed pseudoaneurysms.

2.  Diagnostic fistulogram demonstrates a widely patent
brachiocephalic AV fistula without evidence of stenosis.

3.  A tunneled right IJ hemodialysis catheter was placed to allow
for left arm rest for the next 2-4 weeks.  Once the left upper
extremity hematomas have healed and in the left arm fistula can
again be accessed, the patient should [REDACTED] for catheter removal.

These findings and recommendations were discussed with Janes
Deandre, PA.

[REDACTED]

## 2013-12-29 ENCOUNTER — Encounter (HOSPITAL_COMMUNITY): Payer: Self-pay | Admitting: Cardiovascular Disease

## 2014-01-05 ENCOUNTER — Other Ambulatory Visit: Payer: Self-pay | Admitting: Nurse Practitioner

## 2014-01-18 DIAGNOSIS — E1129 Type 2 diabetes mellitus with other diabetic kidney complication: Secondary | ICD-10-CM | POA: Insufficient documentation

## 2014-01-20 DIAGNOSIS — M109 Gout, unspecified: Secondary | ICD-10-CM | POA: Diagnosis not present

## 2014-01-20 DIAGNOSIS — J151 Pneumonia due to Pseudomonas: Secondary | ICD-10-CM | POA: Diagnosis not present

## 2014-01-20 DIAGNOSIS — J40 Bronchitis, not specified as acute or chronic: Secondary | ICD-10-CM | POA: Diagnosis not present

## 2014-01-20 DIAGNOSIS — D539 Nutritional anemia, unspecified: Secondary | ICD-10-CM | POA: Diagnosis not present

## 2014-01-21 DIAGNOSIS — J151 Pneumonia due to Pseudomonas: Secondary | ICD-10-CM | POA: Diagnosis not present

## 2014-01-21 DIAGNOSIS — J40 Bronchitis, not specified as acute or chronic: Secondary | ICD-10-CM | POA: Diagnosis not present

## 2014-01-21 DIAGNOSIS — D539 Nutritional anemia, unspecified: Secondary | ICD-10-CM | POA: Diagnosis not present

## 2014-01-21 DIAGNOSIS — M109 Gout, unspecified: Secondary | ICD-10-CM | POA: Diagnosis not present

## 2014-01-22 DIAGNOSIS — M109 Gout, unspecified: Secondary | ICD-10-CM | POA: Diagnosis not present

## 2014-01-22 DIAGNOSIS — D539 Nutritional anemia, unspecified: Secondary | ICD-10-CM | POA: Diagnosis not present

## 2014-01-22 DIAGNOSIS — J40 Bronchitis, not specified as acute or chronic: Secondary | ICD-10-CM | POA: Diagnosis not present

## 2014-01-22 DIAGNOSIS — N186 End stage renal disease: Secondary | ICD-10-CM | POA: Diagnosis not present

## 2014-01-22 DIAGNOSIS — J151 Pneumonia due to Pseudomonas: Secondary | ICD-10-CM | POA: Diagnosis not present

## 2014-01-23 DIAGNOSIS — J151 Pneumonia due to Pseudomonas: Secondary | ICD-10-CM | POA: Diagnosis not present

## 2014-01-23 DIAGNOSIS — J40 Bronchitis, not specified as acute or chronic: Secondary | ICD-10-CM | POA: Diagnosis not present

## 2014-01-23 DIAGNOSIS — D539 Nutritional anemia, unspecified: Secondary | ICD-10-CM | POA: Diagnosis not present

## 2014-01-23 DIAGNOSIS — M109 Gout, unspecified: Secondary | ICD-10-CM | POA: Diagnosis not present

## 2014-01-24 DIAGNOSIS — M109 Gout, unspecified: Secondary | ICD-10-CM | POA: Diagnosis not present

## 2014-01-24 DIAGNOSIS — N186 End stage renal disease: Secondary | ICD-10-CM | POA: Diagnosis not present

## 2014-01-24 DIAGNOSIS — J151 Pneumonia due to Pseudomonas: Secondary | ICD-10-CM | POA: Diagnosis not present

## 2014-01-24 DIAGNOSIS — D539 Nutritional anemia, unspecified: Secondary | ICD-10-CM | POA: Diagnosis not present

## 2014-01-24 DIAGNOSIS — J40 Bronchitis, not specified as acute or chronic: Secondary | ICD-10-CM | POA: Diagnosis not present

## 2014-01-25 DIAGNOSIS — J151 Pneumonia due to Pseudomonas: Secondary | ICD-10-CM | POA: Diagnosis not present

## 2014-01-25 DIAGNOSIS — J40 Bronchitis, not specified as acute or chronic: Secondary | ICD-10-CM | POA: Diagnosis not present

## 2014-01-25 DIAGNOSIS — M109 Gout, unspecified: Secondary | ICD-10-CM | POA: Diagnosis not present

## 2014-01-25 DIAGNOSIS — D539 Nutritional anemia, unspecified: Secondary | ICD-10-CM | POA: Diagnosis not present

## 2014-01-26 DIAGNOSIS — N186 End stage renal disease: Secondary | ICD-10-CM | POA: Diagnosis not present

## 2014-01-26 DIAGNOSIS — D539 Nutritional anemia, unspecified: Secondary | ICD-10-CM | POA: Diagnosis not present

## 2014-01-26 DIAGNOSIS — J151 Pneumonia due to Pseudomonas: Secondary | ICD-10-CM | POA: Diagnosis not present

## 2014-01-26 DIAGNOSIS — M109 Gout, unspecified: Secondary | ICD-10-CM | POA: Diagnosis not present

## 2014-01-26 DIAGNOSIS — J40 Bronchitis, not specified as acute or chronic: Secondary | ICD-10-CM | POA: Diagnosis not present

## 2014-01-27 DIAGNOSIS — M109 Gout, unspecified: Secondary | ICD-10-CM | POA: Diagnosis not present

## 2014-01-27 DIAGNOSIS — Z6841 Body Mass Index (BMI) 40.0 and over, adult: Secondary | ICD-10-CM | POA: Diagnosis not present

## 2014-01-27 DIAGNOSIS — D539 Nutritional anemia, unspecified: Secondary | ICD-10-CM | POA: Diagnosis not present

## 2014-01-27 DIAGNOSIS — N186 End stage renal disease: Secondary | ICD-10-CM | POA: Diagnosis not present

## 2014-01-27 DIAGNOSIS — J151 Pneumonia due to Pseudomonas: Secondary | ICD-10-CM | POA: Diagnosis not present

## 2014-01-27 DIAGNOSIS — E1129 Type 2 diabetes mellitus with other diabetic kidney complication: Secondary | ICD-10-CM | POA: Diagnosis not present

## 2014-01-27 DIAGNOSIS — J40 Bronchitis, not specified as acute or chronic: Secondary | ICD-10-CM | POA: Diagnosis not present

## 2014-01-27 DIAGNOSIS — I10 Essential (primary) hypertension: Secondary | ICD-10-CM | POA: Diagnosis not present

## 2014-01-27 DIAGNOSIS — E785 Hyperlipidemia, unspecified: Secondary | ICD-10-CM | POA: Diagnosis not present

## 2014-01-28 DIAGNOSIS — N186 End stage renal disease: Secondary | ICD-10-CM | POA: Diagnosis not present

## 2014-01-28 DIAGNOSIS — M109 Gout, unspecified: Secondary | ICD-10-CM | POA: Diagnosis not present

## 2014-01-28 DIAGNOSIS — J151 Pneumonia due to Pseudomonas: Secondary | ICD-10-CM | POA: Diagnosis not present

## 2014-01-28 DIAGNOSIS — J40 Bronchitis, not specified as acute or chronic: Secondary | ICD-10-CM | POA: Diagnosis not present

## 2014-01-28 DIAGNOSIS — D539 Nutritional anemia, unspecified: Secondary | ICD-10-CM | POA: Diagnosis not present

## 2014-01-29 DIAGNOSIS — D539 Nutritional anemia, unspecified: Secondary | ICD-10-CM | POA: Diagnosis not present

## 2014-01-29 DIAGNOSIS — J151 Pneumonia due to Pseudomonas: Secondary | ICD-10-CM | POA: Diagnosis not present

## 2014-01-29 DIAGNOSIS — M109 Gout, unspecified: Secondary | ICD-10-CM | POA: Diagnosis not present

## 2014-01-29 DIAGNOSIS — J40 Bronchitis, not specified as acute or chronic: Secondary | ICD-10-CM | POA: Diagnosis not present

## 2014-01-30 DIAGNOSIS — J151 Pneumonia due to Pseudomonas: Secondary | ICD-10-CM | POA: Diagnosis not present

## 2014-01-30 DIAGNOSIS — D539 Nutritional anemia, unspecified: Secondary | ICD-10-CM | POA: Diagnosis not present

## 2014-01-30 DIAGNOSIS — M109 Gout, unspecified: Secondary | ICD-10-CM | POA: Diagnosis not present

## 2014-01-30 DIAGNOSIS — J40 Bronchitis, not specified as acute or chronic: Secondary | ICD-10-CM | POA: Diagnosis not present

## 2014-01-31 DIAGNOSIS — J40 Bronchitis, not specified as acute or chronic: Secondary | ICD-10-CM | POA: Diagnosis not present

## 2014-01-31 DIAGNOSIS — D539 Nutritional anemia, unspecified: Secondary | ICD-10-CM | POA: Diagnosis not present

## 2014-01-31 DIAGNOSIS — J151 Pneumonia due to Pseudomonas: Secondary | ICD-10-CM | POA: Diagnosis not present

## 2014-01-31 DIAGNOSIS — N186 End stage renal disease: Secondary | ICD-10-CM | POA: Diagnosis not present

## 2014-01-31 DIAGNOSIS — M109 Gout, unspecified: Secondary | ICD-10-CM | POA: Diagnosis not present

## 2014-02-01 DIAGNOSIS — J40 Bronchitis, not specified as acute or chronic: Secondary | ICD-10-CM | POA: Diagnosis not present

## 2014-02-01 DIAGNOSIS — Z992 Dependence on renal dialysis: Secondary | ICD-10-CM | POA: Diagnosis not present

## 2014-02-01 DIAGNOSIS — N186 End stage renal disease: Secondary | ICD-10-CM | POA: Diagnosis not present

## 2014-02-01 DIAGNOSIS — I871 Compression of vein: Secondary | ICD-10-CM | POA: Diagnosis not present

## 2014-02-01 DIAGNOSIS — T82858D Stenosis of vascular prosthetic devices, implants and grafts, subsequent encounter: Secondary | ICD-10-CM | POA: Diagnosis not present

## 2014-02-01 DIAGNOSIS — D539 Nutritional anemia, unspecified: Secondary | ICD-10-CM | POA: Diagnosis not present

## 2014-02-01 DIAGNOSIS — M109 Gout, unspecified: Secondary | ICD-10-CM | POA: Diagnosis not present

## 2014-02-01 DIAGNOSIS — J151 Pneumonia due to Pseudomonas: Secondary | ICD-10-CM | POA: Diagnosis not present

## 2014-02-02 DIAGNOSIS — N186 End stage renal disease: Secondary | ICD-10-CM | POA: Diagnosis not present

## 2014-02-02 DIAGNOSIS — J151 Pneumonia due to Pseudomonas: Secondary | ICD-10-CM | POA: Diagnosis not present

## 2014-02-02 DIAGNOSIS — J40 Bronchitis, not specified as acute or chronic: Secondary | ICD-10-CM | POA: Diagnosis not present

## 2014-02-02 DIAGNOSIS — M109 Gout, unspecified: Secondary | ICD-10-CM | POA: Diagnosis not present

## 2014-02-02 DIAGNOSIS — D539 Nutritional anemia, unspecified: Secondary | ICD-10-CM | POA: Diagnosis not present

## 2014-02-03 DIAGNOSIS — J40 Bronchitis, not specified as acute or chronic: Secondary | ICD-10-CM | POA: Diagnosis not present

## 2014-02-03 DIAGNOSIS — J151 Pneumonia due to Pseudomonas: Secondary | ICD-10-CM | POA: Diagnosis not present

## 2014-02-03 DIAGNOSIS — M109 Gout, unspecified: Secondary | ICD-10-CM | POA: Diagnosis not present

## 2014-02-03 DIAGNOSIS — D539 Nutritional anemia, unspecified: Secondary | ICD-10-CM | POA: Diagnosis not present

## 2014-02-04 DIAGNOSIS — D539 Nutritional anemia, unspecified: Secondary | ICD-10-CM | POA: Diagnosis not present

## 2014-02-04 DIAGNOSIS — J151 Pneumonia due to Pseudomonas: Secondary | ICD-10-CM | POA: Diagnosis not present

## 2014-02-04 DIAGNOSIS — N186 End stage renal disease: Secondary | ICD-10-CM | POA: Diagnosis not present

## 2014-02-04 DIAGNOSIS — J40 Bronchitis, not specified as acute or chronic: Secondary | ICD-10-CM | POA: Diagnosis not present

## 2014-02-04 DIAGNOSIS — M109 Gout, unspecified: Secondary | ICD-10-CM | POA: Diagnosis not present

## 2014-02-05 DIAGNOSIS — J40 Bronchitis, not specified as acute or chronic: Secondary | ICD-10-CM | POA: Diagnosis not present

## 2014-02-05 DIAGNOSIS — J151 Pneumonia due to Pseudomonas: Secondary | ICD-10-CM | POA: Diagnosis not present

## 2014-02-05 DIAGNOSIS — D539 Nutritional anemia, unspecified: Secondary | ICD-10-CM | POA: Diagnosis not present

## 2014-02-05 DIAGNOSIS — M109 Gout, unspecified: Secondary | ICD-10-CM | POA: Diagnosis not present

## 2014-02-06 DIAGNOSIS — D539 Nutritional anemia, unspecified: Secondary | ICD-10-CM | POA: Diagnosis not present

## 2014-02-06 DIAGNOSIS — J151 Pneumonia due to Pseudomonas: Secondary | ICD-10-CM | POA: Diagnosis not present

## 2014-02-06 DIAGNOSIS — J40 Bronchitis, not specified as acute or chronic: Secondary | ICD-10-CM | POA: Diagnosis not present

## 2014-02-06 DIAGNOSIS — M109 Gout, unspecified: Secondary | ICD-10-CM | POA: Diagnosis not present

## 2014-02-07 DIAGNOSIS — J151 Pneumonia due to Pseudomonas: Secondary | ICD-10-CM | POA: Diagnosis not present

## 2014-02-07 DIAGNOSIS — M109 Gout, unspecified: Secondary | ICD-10-CM | POA: Diagnosis not present

## 2014-02-07 DIAGNOSIS — J40 Bronchitis, not specified as acute or chronic: Secondary | ICD-10-CM | POA: Diagnosis not present

## 2014-02-07 DIAGNOSIS — N186 End stage renal disease: Secondary | ICD-10-CM | POA: Diagnosis not present

## 2014-02-07 DIAGNOSIS — D539 Nutritional anemia, unspecified: Secondary | ICD-10-CM | POA: Diagnosis not present

## 2014-02-08 DIAGNOSIS — D539 Nutritional anemia, unspecified: Secondary | ICD-10-CM | POA: Diagnosis not present

## 2014-02-08 DIAGNOSIS — J40 Bronchitis, not specified as acute or chronic: Secondary | ICD-10-CM | POA: Diagnosis not present

## 2014-02-08 DIAGNOSIS — M109 Gout, unspecified: Secondary | ICD-10-CM | POA: Diagnosis not present

## 2014-02-08 DIAGNOSIS — J151 Pneumonia due to Pseudomonas: Secondary | ICD-10-CM | POA: Diagnosis not present

## 2014-02-09 DIAGNOSIS — M109 Gout, unspecified: Secondary | ICD-10-CM | POA: Diagnosis not present

## 2014-02-09 DIAGNOSIS — J40 Bronchitis, not specified as acute or chronic: Secondary | ICD-10-CM | POA: Diagnosis not present

## 2014-02-09 DIAGNOSIS — D539 Nutritional anemia, unspecified: Secondary | ICD-10-CM | POA: Diagnosis not present

## 2014-02-09 DIAGNOSIS — J151 Pneumonia due to Pseudomonas: Secondary | ICD-10-CM | POA: Diagnosis not present

## 2014-02-09 DIAGNOSIS — N186 End stage renal disease: Secondary | ICD-10-CM | POA: Diagnosis not present

## 2014-02-10 DIAGNOSIS — J40 Bronchitis, not specified as acute or chronic: Secondary | ICD-10-CM | POA: Diagnosis not present

## 2014-02-10 DIAGNOSIS — D539 Nutritional anemia, unspecified: Secondary | ICD-10-CM | POA: Diagnosis not present

## 2014-02-10 DIAGNOSIS — J151 Pneumonia due to Pseudomonas: Secondary | ICD-10-CM | POA: Diagnosis not present

## 2014-02-10 DIAGNOSIS — M109 Gout, unspecified: Secondary | ICD-10-CM | POA: Diagnosis not present

## 2014-02-11 DIAGNOSIS — J151 Pneumonia due to Pseudomonas: Secondary | ICD-10-CM | POA: Diagnosis not present

## 2014-02-11 DIAGNOSIS — J40 Bronchitis, not specified as acute or chronic: Secondary | ICD-10-CM | POA: Diagnosis not present

## 2014-02-11 DIAGNOSIS — D539 Nutritional anemia, unspecified: Secondary | ICD-10-CM | POA: Diagnosis not present

## 2014-02-11 DIAGNOSIS — N186 End stage renal disease: Secondary | ICD-10-CM | POA: Diagnosis not present

## 2014-02-11 DIAGNOSIS — M109 Gout, unspecified: Secondary | ICD-10-CM | POA: Diagnosis not present

## 2014-02-12 DIAGNOSIS — D539 Nutritional anemia, unspecified: Secondary | ICD-10-CM | POA: Diagnosis not present

## 2014-02-12 DIAGNOSIS — J40 Bronchitis, not specified as acute or chronic: Secondary | ICD-10-CM | POA: Diagnosis not present

## 2014-02-12 DIAGNOSIS — M109 Gout, unspecified: Secondary | ICD-10-CM | POA: Diagnosis not present

## 2014-02-12 DIAGNOSIS — J151 Pneumonia due to Pseudomonas: Secondary | ICD-10-CM | POA: Diagnosis not present

## 2014-02-13 DIAGNOSIS — J151 Pneumonia due to Pseudomonas: Secondary | ICD-10-CM | POA: Diagnosis not present

## 2014-02-13 DIAGNOSIS — M109 Gout, unspecified: Secondary | ICD-10-CM | POA: Diagnosis not present

## 2014-02-13 DIAGNOSIS — J40 Bronchitis, not specified as acute or chronic: Secondary | ICD-10-CM | POA: Diagnosis not present

## 2014-02-13 DIAGNOSIS — N186 End stage renal disease: Secondary | ICD-10-CM | POA: Diagnosis not present

## 2014-02-13 DIAGNOSIS — D539 Nutritional anemia, unspecified: Secondary | ICD-10-CM | POA: Diagnosis not present

## 2014-02-14 DIAGNOSIS — G8921 Chronic pain due to trauma: Secondary | ICD-10-CM | POA: Diagnosis not present

## 2014-02-14 DIAGNOSIS — G4733 Obstructive sleep apnea (adult) (pediatric): Secondary | ICD-10-CM | POA: Diagnosis not present

## 2014-02-14 DIAGNOSIS — E119 Type 2 diabetes mellitus without complications: Secondary | ICD-10-CM | POA: Diagnosis not present

## 2014-02-14 DIAGNOSIS — H1851 Endothelial corneal dystrophy: Secondary | ICD-10-CM | POA: Diagnosis not present

## 2014-02-14 DIAGNOSIS — I120 Hypertensive chronic kidney disease with stage 5 chronic kidney disease or end stage renal disease: Secondary | ICD-10-CM | POA: Diagnosis not present

## 2014-02-14 DIAGNOSIS — I509 Heart failure, unspecified: Secondary | ICD-10-CM | POA: Diagnosis not present

## 2014-02-14 DIAGNOSIS — Z992 Dependence on renal dialysis: Secondary | ICD-10-CM | POA: Diagnosis not present

## 2014-02-14 DIAGNOSIS — N186 End stage renal disease: Secondary | ICD-10-CM | POA: Diagnosis not present

## 2014-02-14 DIAGNOSIS — H5711 Ocular pain, right eye: Secondary | ICD-10-CM | POA: Diagnosis not present

## 2014-02-14 DIAGNOSIS — H5441 Blindness, right eye, normal vision left eye: Secondary | ICD-10-CM | POA: Diagnosis not present

## 2014-02-14 DIAGNOSIS — E785 Hyperlipidemia, unspecified: Secondary | ICD-10-CM | POA: Diagnosis not present

## 2014-02-16 DIAGNOSIS — N186 End stage renal disease: Secondary | ICD-10-CM | POA: Diagnosis not present

## 2014-02-18 DIAGNOSIS — N186 End stage renal disease: Secondary | ICD-10-CM | POA: Diagnosis not present

## 2014-02-19 DIAGNOSIS — N186 End stage renal disease: Secondary | ICD-10-CM | POA: Diagnosis not present

## 2014-02-19 DIAGNOSIS — Z992 Dependence on renal dialysis: Secondary | ICD-10-CM | POA: Diagnosis not present

## 2014-02-20 DIAGNOSIS — J40 Bronchitis, not specified as acute or chronic: Secondary | ICD-10-CM | POA: Diagnosis not present

## 2014-02-20 DIAGNOSIS — D539 Nutritional anemia, unspecified: Secondary | ICD-10-CM | POA: Diagnosis not present

## 2014-02-20 DIAGNOSIS — M109 Gout, unspecified: Secondary | ICD-10-CM | POA: Diagnosis not present

## 2014-02-20 DIAGNOSIS — J151 Pneumonia due to Pseudomonas: Secondary | ICD-10-CM | POA: Diagnosis not present

## 2014-02-21 DIAGNOSIS — J151 Pneumonia due to Pseudomonas: Secondary | ICD-10-CM | POA: Diagnosis not present

## 2014-02-21 DIAGNOSIS — D539 Nutritional anemia, unspecified: Secondary | ICD-10-CM | POA: Diagnosis not present

## 2014-02-21 DIAGNOSIS — M109 Gout, unspecified: Secondary | ICD-10-CM | POA: Diagnosis not present

## 2014-02-21 DIAGNOSIS — J40 Bronchitis, not specified as acute or chronic: Secondary | ICD-10-CM | POA: Diagnosis not present

## 2014-02-21 DIAGNOSIS — N186 End stage renal disease: Secondary | ICD-10-CM | POA: Diagnosis not present

## 2014-02-22 DIAGNOSIS — J151 Pneumonia due to Pseudomonas: Secondary | ICD-10-CM | POA: Diagnosis not present

## 2014-02-22 DIAGNOSIS — J40 Bronchitis, not specified as acute or chronic: Secondary | ICD-10-CM | POA: Diagnosis not present

## 2014-02-22 DIAGNOSIS — D539 Nutritional anemia, unspecified: Secondary | ICD-10-CM | POA: Diagnosis not present

## 2014-02-22 DIAGNOSIS — M109 Gout, unspecified: Secondary | ICD-10-CM | POA: Diagnosis not present

## 2014-02-23 DIAGNOSIS — N186 End stage renal disease: Secondary | ICD-10-CM | POA: Diagnosis not present

## 2014-02-23 DIAGNOSIS — J151 Pneumonia due to Pseudomonas: Secondary | ICD-10-CM | POA: Diagnosis not present

## 2014-02-23 DIAGNOSIS — D539 Nutritional anemia, unspecified: Secondary | ICD-10-CM | POA: Diagnosis not present

## 2014-02-23 DIAGNOSIS — M109 Gout, unspecified: Secondary | ICD-10-CM | POA: Diagnosis not present

## 2014-02-23 DIAGNOSIS — J40 Bronchitis, not specified as acute or chronic: Secondary | ICD-10-CM | POA: Diagnosis not present

## 2014-02-24 DIAGNOSIS — M109 Gout, unspecified: Secondary | ICD-10-CM | POA: Diagnosis not present

## 2014-02-24 DIAGNOSIS — J151 Pneumonia due to Pseudomonas: Secondary | ICD-10-CM | POA: Diagnosis not present

## 2014-02-24 DIAGNOSIS — D539 Nutritional anemia, unspecified: Secondary | ICD-10-CM | POA: Diagnosis not present

## 2014-02-24 DIAGNOSIS — J40 Bronchitis, not specified as acute or chronic: Secondary | ICD-10-CM | POA: Diagnosis not present

## 2014-02-25 DIAGNOSIS — N186 End stage renal disease: Secondary | ICD-10-CM | POA: Diagnosis not present

## 2014-02-25 DIAGNOSIS — J151 Pneumonia due to Pseudomonas: Secondary | ICD-10-CM | POA: Diagnosis not present

## 2014-02-25 DIAGNOSIS — J40 Bronchitis, not specified as acute or chronic: Secondary | ICD-10-CM | POA: Diagnosis not present

## 2014-02-25 DIAGNOSIS — M109 Gout, unspecified: Secondary | ICD-10-CM | POA: Diagnosis not present

## 2014-02-25 DIAGNOSIS — D539 Nutritional anemia, unspecified: Secondary | ICD-10-CM | POA: Diagnosis not present

## 2014-02-26 DIAGNOSIS — J40 Bronchitis, not specified as acute or chronic: Secondary | ICD-10-CM | POA: Diagnosis not present

## 2014-02-26 DIAGNOSIS — J151 Pneumonia due to Pseudomonas: Secondary | ICD-10-CM | POA: Diagnosis not present

## 2014-02-26 DIAGNOSIS — M109 Gout, unspecified: Secondary | ICD-10-CM | POA: Diagnosis not present

## 2014-02-26 DIAGNOSIS — D539 Nutritional anemia, unspecified: Secondary | ICD-10-CM | POA: Diagnosis not present

## 2014-02-27 DIAGNOSIS — J40 Bronchitis, not specified as acute or chronic: Secondary | ICD-10-CM | POA: Diagnosis not present

## 2014-02-27 DIAGNOSIS — D539 Nutritional anemia, unspecified: Secondary | ICD-10-CM | POA: Diagnosis not present

## 2014-02-27 DIAGNOSIS — M109 Gout, unspecified: Secondary | ICD-10-CM | POA: Diagnosis not present

## 2014-02-27 DIAGNOSIS — J151 Pneumonia due to Pseudomonas: Secondary | ICD-10-CM | POA: Diagnosis not present

## 2014-02-28 DIAGNOSIS — D539 Nutritional anemia, unspecified: Secondary | ICD-10-CM | POA: Diagnosis not present

## 2014-02-28 DIAGNOSIS — J151 Pneumonia due to Pseudomonas: Secondary | ICD-10-CM | POA: Diagnosis not present

## 2014-02-28 DIAGNOSIS — N186 End stage renal disease: Secondary | ICD-10-CM | POA: Diagnosis not present

## 2014-02-28 DIAGNOSIS — J40 Bronchitis, not specified as acute or chronic: Secondary | ICD-10-CM | POA: Diagnosis not present

## 2014-02-28 DIAGNOSIS — M109 Gout, unspecified: Secondary | ICD-10-CM | POA: Diagnosis not present

## 2014-03-01 DIAGNOSIS — D539 Nutritional anemia, unspecified: Secondary | ICD-10-CM | POA: Diagnosis not present

## 2014-03-01 DIAGNOSIS — M109 Gout, unspecified: Secondary | ICD-10-CM | POA: Diagnosis not present

## 2014-03-01 DIAGNOSIS — J40 Bronchitis, not specified as acute or chronic: Secondary | ICD-10-CM | POA: Diagnosis not present

## 2014-03-01 DIAGNOSIS — J151 Pneumonia due to Pseudomonas: Secondary | ICD-10-CM | POA: Diagnosis not present

## 2014-03-02 DIAGNOSIS — M109 Gout, unspecified: Secondary | ICD-10-CM | POA: Diagnosis not present

## 2014-03-02 DIAGNOSIS — J40 Bronchitis, not specified as acute or chronic: Secondary | ICD-10-CM | POA: Diagnosis not present

## 2014-03-02 DIAGNOSIS — J151 Pneumonia due to Pseudomonas: Secondary | ICD-10-CM | POA: Diagnosis not present

## 2014-03-02 DIAGNOSIS — N186 End stage renal disease: Secondary | ICD-10-CM | POA: Diagnosis not present

## 2014-03-02 DIAGNOSIS — D539 Nutritional anemia, unspecified: Secondary | ICD-10-CM | POA: Diagnosis not present

## 2014-03-03 DIAGNOSIS — J40 Bronchitis, not specified as acute or chronic: Secondary | ICD-10-CM | POA: Diagnosis not present

## 2014-03-03 DIAGNOSIS — M109 Gout, unspecified: Secondary | ICD-10-CM | POA: Diagnosis not present

## 2014-03-03 DIAGNOSIS — D539 Nutritional anemia, unspecified: Secondary | ICD-10-CM | POA: Diagnosis not present

## 2014-03-03 DIAGNOSIS — J151 Pneumonia due to Pseudomonas: Secondary | ICD-10-CM | POA: Diagnosis not present

## 2014-03-04 DIAGNOSIS — N186 End stage renal disease: Secondary | ICD-10-CM | POA: Diagnosis not present

## 2014-03-04 DIAGNOSIS — J40 Bronchitis, not specified as acute or chronic: Secondary | ICD-10-CM | POA: Diagnosis not present

## 2014-03-04 DIAGNOSIS — M109 Gout, unspecified: Secondary | ICD-10-CM | POA: Diagnosis not present

## 2014-03-04 DIAGNOSIS — D539 Nutritional anemia, unspecified: Secondary | ICD-10-CM | POA: Diagnosis not present

## 2014-03-04 DIAGNOSIS — J151 Pneumonia due to Pseudomonas: Secondary | ICD-10-CM | POA: Diagnosis not present

## 2014-03-05 DIAGNOSIS — D539 Nutritional anemia, unspecified: Secondary | ICD-10-CM | POA: Diagnosis not present

## 2014-03-05 DIAGNOSIS — M109 Gout, unspecified: Secondary | ICD-10-CM | POA: Diagnosis not present

## 2014-03-05 DIAGNOSIS — J151 Pneumonia due to Pseudomonas: Secondary | ICD-10-CM | POA: Diagnosis not present

## 2014-03-05 DIAGNOSIS — J40 Bronchitis, not specified as acute or chronic: Secondary | ICD-10-CM | POA: Diagnosis not present

## 2014-03-06 DIAGNOSIS — M109 Gout, unspecified: Secondary | ICD-10-CM | POA: Diagnosis not present

## 2014-03-06 DIAGNOSIS — J151 Pneumonia due to Pseudomonas: Secondary | ICD-10-CM | POA: Diagnosis not present

## 2014-03-06 DIAGNOSIS — J40 Bronchitis, not specified as acute or chronic: Secondary | ICD-10-CM | POA: Diagnosis not present

## 2014-03-06 DIAGNOSIS — D539 Nutritional anemia, unspecified: Secondary | ICD-10-CM | POA: Diagnosis not present

## 2014-03-07 DIAGNOSIS — J151 Pneumonia due to Pseudomonas: Secondary | ICD-10-CM | POA: Diagnosis not present

## 2014-03-07 DIAGNOSIS — J40 Bronchitis, not specified as acute or chronic: Secondary | ICD-10-CM | POA: Diagnosis not present

## 2014-03-07 DIAGNOSIS — M109 Gout, unspecified: Secondary | ICD-10-CM | POA: Diagnosis not present

## 2014-03-07 DIAGNOSIS — N186 End stage renal disease: Secondary | ICD-10-CM | POA: Diagnosis not present

## 2014-03-07 DIAGNOSIS — D539 Nutritional anemia, unspecified: Secondary | ICD-10-CM | POA: Diagnosis not present

## 2014-03-08 DIAGNOSIS — D539 Nutritional anemia, unspecified: Secondary | ICD-10-CM | POA: Diagnosis not present

## 2014-03-08 DIAGNOSIS — M109 Gout, unspecified: Secondary | ICD-10-CM | POA: Diagnosis not present

## 2014-03-08 DIAGNOSIS — J151 Pneumonia due to Pseudomonas: Secondary | ICD-10-CM | POA: Diagnosis not present

## 2014-03-08 DIAGNOSIS — J40 Bronchitis, not specified as acute or chronic: Secondary | ICD-10-CM | POA: Diagnosis not present

## 2014-03-09 DIAGNOSIS — M109 Gout, unspecified: Secondary | ICD-10-CM | POA: Diagnosis not present

## 2014-03-09 DIAGNOSIS — J40 Bronchitis, not specified as acute or chronic: Secondary | ICD-10-CM | POA: Diagnosis not present

## 2014-03-09 DIAGNOSIS — D539 Nutritional anemia, unspecified: Secondary | ICD-10-CM | POA: Diagnosis not present

## 2014-03-09 DIAGNOSIS — N186 End stage renal disease: Secondary | ICD-10-CM | POA: Diagnosis not present

## 2014-03-09 DIAGNOSIS — J151 Pneumonia due to Pseudomonas: Secondary | ICD-10-CM | POA: Diagnosis not present

## 2014-03-10 DIAGNOSIS — N186 End stage renal disease: Secondary | ICD-10-CM | POA: Diagnosis not present

## 2014-03-10 DIAGNOSIS — J40 Bronchitis, not specified as acute or chronic: Secondary | ICD-10-CM | POA: Diagnosis not present

## 2014-03-10 DIAGNOSIS — D539 Nutritional anemia, unspecified: Secondary | ICD-10-CM | POA: Diagnosis not present

## 2014-03-10 DIAGNOSIS — J151 Pneumonia due to Pseudomonas: Secondary | ICD-10-CM | POA: Diagnosis not present

## 2014-03-10 DIAGNOSIS — M109 Gout, unspecified: Secondary | ICD-10-CM | POA: Diagnosis not present

## 2014-03-11 DIAGNOSIS — J40 Bronchitis, not specified as acute or chronic: Secondary | ICD-10-CM | POA: Diagnosis not present

## 2014-03-11 DIAGNOSIS — D539 Nutritional anemia, unspecified: Secondary | ICD-10-CM | POA: Diagnosis not present

## 2014-03-11 DIAGNOSIS — J151 Pneumonia due to Pseudomonas: Secondary | ICD-10-CM | POA: Diagnosis not present

## 2014-03-11 DIAGNOSIS — M109 Gout, unspecified: Secondary | ICD-10-CM | POA: Diagnosis not present

## 2014-03-12 DIAGNOSIS — J40 Bronchitis, not specified as acute or chronic: Secondary | ICD-10-CM | POA: Diagnosis not present

## 2014-03-12 DIAGNOSIS — J151 Pneumonia due to Pseudomonas: Secondary | ICD-10-CM | POA: Diagnosis not present

## 2014-03-12 DIAGNOSIS — D539 Nutritional anemia, unspecified: Secondary | ICD-10-CM | POA: Diagnosis not present

## 2014-03-12 DIAGNOSIS — M109 Gout, unspecified: Secondary | ICD-10-CM | POA: Diagnosis not present

## 2014-03-13 DIAGNOSIS — J151 Pneumonia due to Pseudomonas: Secondary | ICD-10-CM | POA: Diagnosis not present

## 2014-03-13 DIAGNOSIS — J40 Bronchitis, not specified as acute or chronic: Secondary | ICD-10-CM | POA: Diagnosis not present

## 2014-03-13 DIAGNOSIS — M109 Gout, unspecified: Secondary | ICD-10-CM | POA: Diagnosis not present

## 2014-03-13 DIAGNOSIS — D539 Nutritional anemia, unspecified: Secondary | ICD-10-CM | POA: Diagnosis not present

## 2014-03-14 DIAGNOSIS — J40 Bronchitis, not specified as acute or chronic: Secondary | ICD-10-CM | POA: Diagnosis not present

## 2014-03-14 DIAGNOSIS — D539 Nutritional anemia, unspecified: Secondary | ICD-10-CM | POA: Diagnosis not present

## 2014-03-14 DIAGNOSIS — N186 End stage renal disease: Secondary | ICD-10-CM | POA: Diagnosis not present

## 2014-03-14 DIAGNOSIS — M109 Gout, unspecified: Secondary | ICD-10-CM | POA: Diagnosis not present

## 2014-03-14 DIAGNOSIS — J151 Pneumonia due to Pseudomonas: Secondary | ICD-10-CM | POA: Diagnosis not present

## 2014-03-15 DIAGNOSIS — D539 Nutritional anemia, unspecified: Secondary | ICD-10-CM | POA: Diagnosis not present

## 2014-03-15 DIAGNOSIS — M109 Gout, unspecified: Secondary | ICD-10-CM | POA: Diagnosis not present

## 2014-03-15 DIAGNOSIS — J40 Bronchitis, not specified as acute or chronic: Secondary | ICD-10-CM | POA: Diagnosis not present

## 2014-03-15 DIAGNOSIS — E1140 Type 2 diabetes mellitus with diabetic neuropathy, unspecified: Secondary | ICD-10-CM | POA: Diagnosis not present

## 2014-03-15 DIAGNOSIS — J151 Pneumonia due to Pseudomonas: Secondary | ICD-10-CM | POA: Diagnosis not present

## 2014-03-15 DIAGNOSIS — I2510 Atherosclerotic heart disease of native coronary artery without angina pectoris: Secondary | ICD-10-CM | POA: Diagnosis not present

## 2014-03-15 DIAGNOSIS — N186 End stage renal disease: Secondary | ICD-10-CM | POA: Diagnosis not present

## 2014-03-15 DIAGNOSIS — I5022 Chronic systolic (congestive) heart failure: Secondary | ICD-10-CM | POA: Diagnosis not present

## 2014-03-16 DIAGNOSIS — M109 Gout, unspecified: Secondary | ICD-10-CM | POA: Diagnosis not present

## 2014-03-16 DIAGNOSIS — J40 Bronchitis, not specified as acute or chronic: Secondary | ICD-10-CM | POA: Diagnosis not present

## 2014-03-16 DIAGNOSIS — N186 End stage renal disease: Secondary | ICD-10-CM | POA: Diagnosis not present

## 2014-03-16 DIAGNOSIS — D539 Nutritional anemia, unspecified: Secondary | ICD-10-CM | POA: Diagnosis not present

## 2014-03-16 DIAGNOSIS — J151 Pneumonia due to Pseudomonas: Secondary | ICD-10-CM | POA: Diagnosis not present

## 2014-03-18 DIAGNOSIS — N186 End stage renal disease: Secondary | ICD-10-CM | POA: Diagnosis not present

## 2014-03-20 DIAGNOSIS — N186 End stage renal disease: Secondary | ICD-10-CM | POA: Diagnosis not present

## 2014-03-20 DIAGNOSIS — Z992 Dependence on renal dialysis: Secondary | ICD-10-CM | POA: Diagnosis not present

## 2014-03-21 DIAGNOSIS — M109 Gout, unspecified: Secondary | ICD-10-CM | POA: Diagnosis not present

## 2014-03-21 DIAGNOSIS — D539 Nutritional anemia, unspecified: Secondary | ICD-10-CM | POA: Diagnosis not present

## 2014-03-21 DIAGNOSIS — J151 Pneumonia due to Pseudomonas: Secondary | ICD-10-CM | POA: Diagnosis not present

## 2014-03-21 DIAGNOSIS — J40 Bronchitis, not specified as acute or chronic: Secondary | ICD-10-CM | POA: Diagnosis not present

## 2014-03-21 DIAGNOSIS — N186 End stage renal disease: Secondary | ICD-10-CM | POA: Diagnosis not present

## 2014-03-22 DIAGNOSIS — J151 Pneumonia due to Pseudomonas: Secondary | ICD-10-CM | POA: Diagnosis not present

## 2014-03-22 DIAGNOSIS — D539 Nutritional anemia, unspecified: Secondary | ICD-10-CM | POA: Diagnosis not present

## 2014-03-22 DIAGNOSIS — M109 Gout, unspecified: Secondary | ICD-10-CM | POA: Diagnosis not present

## 2014-03-22 DIAGNOSIS — J40 Bronchitis, not specified as acute or chronic: Secondary | ICD-10-CM | POA: Diagnosis not present

## 2014-03-23 DIAGNOSIS — J40 Bronchitis, not specified as acute or chronic: Secondary | ICD-10-CM | POA: Diagnosis not present

## 2014-03-23 DIAGNOSIS — D539 Nutritional anemia, unspecified: Secondary | ICD-10-CM | POA: Diagnosis not present

## 2014-03-23 DIAGNOSIS — N186 End stage renal disease: Secondary | ICD-10-CM | POA: Diagnosis not present

## 2014-03-23 DIAGNOSIS — J151 Pneumonia due to Pseudomonas: Secondary | ICD-10-CM | POA: Diagnosis not present

## 2014-03-23 DIAGNOSIS — M109 Gout, unspecified: Secondary | ICD-10-CM | POA: Diagnosis not present

## 2014-03-24 DIAGNOSIS — J151 Pneumonia due to Pseudomonas: Secondary | ICD-10-CM | POA: Diagnosis not present

## 2014-03-24 DIAGNOSIS — J40 Bronchitis, not specified as acute or chronic: Secondary | ICD-10-CM | POA: Diagnosis not present

## 2014-03-24 DIAGNOSIS — M109 Gout, unspecified: Secondary | ICD-10-CM | POA: Diagnosis not present

## 2014-03-24 DIAGNOSIS — D539 Nutritional anemia, unspecified: Secondary | ICD-10-CM | POA: Diagnosis not present

## 2014-03-25 DIAGNOSIS — J40 Bronchitis, not specified as acute or chronic: Secondary | ICD-10-CM | POA: Diagnosis not present

## 2014-03-25 DIAGNOSIS — D539 Nutritional anemia, unspecified: Secondary | ICD-10-CM | POA: Diagnosis not present

## 2014-03-25 DIAGNOSIS — N186 End stage renal disease: Secondary | ICD-10-CM | POA: Diagnosis not present

## 2014-03-25 DIAGNOSIS — J151 Pneumonia due to Pseudomonas: Secondary | ICD-10-CM | POA: Diagnosis not present

## 2014-03-25 DIAGNOSIS — M109 Gout, unspecified: Secondary | ICD-10-CM | POA: Diagnosis not present

## 2014-03-26 DIAGNOSIS — M109 Gout, unspecified: Secondary | ICD-10-CM | POA: Diagnosis not present

## 2014-03-26 DIAGNOSIS — D539 Nutritional anemia, unspecified: Secondary | ICD-10-CM | POA: Diagnosis not present

## 2014-03-26 DIAGNOSIS — J151 Pneumonia due to Pseudomonas: Secondary | ICD-10-CM | POA: Diagnosis not present

## 2014-03-26 DIAGNOSIS — J40 Bronchitis, not specified as acute or chronic: Secondary | ICD-10-CM | POA: Diagnosis not present

## 2014-03-27 DIAGNOSIS — D539 Nutritional anemia, unspecified: Secondary | ICD-10-CM | POA: Diagnosis not present

## 2014-03-27 DIAGNOSIS — J151 Pneumonia due to Pseudomonas: Secondary | ICD-10-CM | POA: Diagnosis not present

## 2014-03-27 DIAGNOSIS — M109 Gout, unspecified: Secondary | ICD-10-CM | POA: Diagnosis not present

## 2014-03-27 DIAGNOSIS — J40 Bronchitis, not specified as acute or chronic: Secondary | ICD-10-CM | POA: Diagnosis not present

## 2014-03-28 DIAGNOSIS — N186 End stage renal disease: Secondary | ICD-10-CM | POA: Diagnosis not present

## 2014-03-28 DIAGNOSIS — M109 Gout, unspecified: Secondary | ICD-10-CM | POA: Diagnosis not present

## 2014-03-28 DIAGNOSIS — D539 Nutritional anemia, unspecified: Secondary | ICD-10-CM | POA: Diagnosis not present

## 2014-03-28 DIAGNOSIS — J40 Bronchitis, not specified as acute or chronic: Secondary | ICD-10-CM | POA: Diagnosis not present

## 2014-03-28 DIAGNOSIS — J151 Pneumonia due to Pseudomonas: Secondary | ICD-10-CM | POA: Diagnosis not present

## 2014-03-29 DIAGNOSIS — M109 Gout, unspecified: Secondary | ICD-10-CM | POA: Diagnosis not present

## 2014-03-29 DIAGNOSIS — J40 Bronchitis, not specified as acute or chronic: Secondary | ICD-10-CM | POA: Diagnosis not present

## 2014-03-29 DIAGNOSIS — J151 Pneumonia due to Pseudomonas: Secondary | ICD-10-CM | POA: Diagnosis not present

## 2014-03-29 DIAGNOSIS — D539 Nutritional anemia, unspecified: Secondary | ICD-10-CM | POA: Diagnosis not present

## 2014-03-30 DIAGNOSIS — D539 Nutritional anemia, unspecified: Secondary | ICD-10-CM | POA: Diagnosis not present

## 2014-03-30 DIAGNOSIS — J151 Pneumonia due to Pseudomonas: Secondary | ICD-10-CM | POA: Diagnosis not present

## 2014-03-30 DIAGNOSIS — J40 Bronchitis, not specified as acute or chronic: Secondary | ICD-10-CM | POA: Diagnosis not present

## 2014-03-30 DIAGNOSIS — M109 Gout, unspecified: Secondary | ICD-10-CM | POA: Diagnosis not present

## 2014-03-30 DIAGNOSIS — N186 End stage renal disease: Secondary | ICD-10-CM | POA: Diagnosis not present

## 2014-03-31 DIAGNOSIS — J151 Pneumonia due to Pseudomonas: Secondary | ICD-10-CM | POA: Diagnosis not present

## 2014-03-31 DIAGNOSIS — D539 Nutritional anemia, unspecified: Secondary | ICD-10-CM | POA: Diagnosis not present

## 2014-03-31 DIAGNOSIS — J40 Bronchitis, not specified as acute or chronic: Secondary | ICD-10-CM | POA: Diagnosis not present

## 2014-03-31 DIAGNOSIS — M109 Gout, unspecified: Secondary | ICD-10-CM | POA: Diagnosis not present

## 2014-04-01 DIAGNOSIS — N186 End stage renal disease: Secondary | ICD-10-CM | POA: Diagnosis not present

## 2014-04-02 DIAGNOSIS — J40 Bronchitis, not specified as acute or chronic: Secondary | ICD-10-CM | POA: Diagnosis not present

## 2014-04-02 DIAGNOSIS — M109 Gout, unspecified: Secondary | ICD-10-CM | POA: Diagnosis not present

## 2014-04-02 DIAGNOSIS — J151 Pneumonia due to Pseudomonas: Secondary | ICD-10-CM | POA: Diagnosis not present

## 2014-04-02 DIAGNOSIS — D539 Nutritional anemia, unspecified: Secondary | ICD-10-CM | POA: Diagnosis not present

## 2014-04-03 DIAGNOSIS — M109 Gout, unspecified: Secondary | ICD-10-CM | POA: Diagnosis not present

## 2014-04-03 DIAGNOSIS — J151 Pneumonia due to Pseudomonas: Secondary | ICD-10-CM | POA: Diagnosis not present

## 2014-04-03 DIAGNOSIS — J40 Bronchitis, not specified as acute or chronic: Secondary | ICD-10-CM | POA: Diagnosis not present

## 2014-04-03 DIAGNOSIS — D539 Nutritional anemia, unspecified: Secondary | ICD-10-CM | POA: Diagnosis not present

## 2014-04-04 DIAGNOSIS — J151 Pneumonia due to Pseudomonas: Secondary | ICD-10-CM | POA: Diagnosis not present

## 2014-04-04 DIAGNOSIS — D539 Nutritional anemia, unspecified: Secondary | ICD-10-CM | POA: Diagnosis not present

## 2014-04-04 DIAGNOSIS — N186 End stage renal disease: Secondary | ICD-10-CM | POA: Diagnosis not present

## 2014-04-04 DIAGNOSIS — M109 Gout, unspecified: Secondary | ICD-10-CM | POA: Diagnosis not present

## 2014-04-04 DIAGNOSIS — J40 Bronchitis, not specified as acute or chronic: Secondary | ICD-10-CM | POA: Diagnosis not present

## 2014-04-05 DIAGNOSIS — D539 Nutritional anemia, unspecified: Secondary | ICD-10-CM | POA: Diagnosis not present

## 2014-04-05 DIAGNOSIS — M109 Gout, unspecified: Secondary | ICD-10-CM | POA: Diagnosis not present

## 2014-04-05 DIAGNOSIS — J40 Bronchitis, not specified as acute or chronic: Secondary | ICD-10-CM | POA: Diagnosis not present

## 2014-04-05 DIAGNOSIS — J151 Pneumonia due to Pseudomonas: Secondary | ICD-10-CM | POA: Diagnosis not present

## 2014-04-06 DIAGNOSIS — J151 Pneumonia due to Pseudomonas: Secondary | ICD-10-CM | POA: Diagnosis not present

## 2014-04-06 DIAGNOSIS — J40 Bronchitis, not specified as acute or chronic: Secondary | ICD-10-CM | POA: Diagnosis not present

## 2014-04-06 DIAGNOSIS — M109 Gout, unspecified: Secondary | ICD-10-CM | POA: Diagnosis not present

## 2014-04-06 DIAGNOSIS — D539 Nutritional anemia, unspecified: Secondary | ICD-10-CM | POA: Diagnosis not present

## 2014-04-06 DIAGNOSIS — N186 End stage renal disease: Secondary | ICD-10-CM | POA: Diagnosis not present

## 2014-04-07 DIAGNOSIS — M109 Gout, unspecified: Secondary | ICD-10-CM | POA: Diagnosis not present

## 2014-04-07 DIAGNOSIS — J40 Bronchitis, not specified as acute or chronic: Secondary | ICD-10-CM | POA: Diagnosis not present

## 2014-04-07 DIAGNOSIS — D539 Nutritional anemia, unspecified: Secondary | ICD-10-CM | POA: Diagnosis not present

## 2014-04-07 DIAGNOSIS — J151 Pneumonia due to Pseudomonas: Secondary | ICD-10-CM | POA: Diagnosis not present

## 2014-04-08 DIAGNOSIS — J151 Pneumonia due to Pseudomonas: Secondary | ICD-10-CM | POA: Diagnosis not present

## 2014-04-08 DIAGNOSIS — J40 Bronchitis, not specified as acute or chronic: Secondary | ICD-10-CM | POA: Diagnosis not present

## 2014-04-08 DIAGNOSIS — M109 Gout, unspecified: Secondary | ICD-10-CM | POA: Diagnosis not present

## 2014-04-08 DIAGNOSIS — D539 Nutritional anemia, unspecified: Secondary | ICD-10-CM | POA: Diagnosis not present

## 2014-04-08 DIAGNOSIS — N186 End stage renal disease: Secondary | ICD-10-CM | POA: Diagnosis not present

## 2014-04-09 DIAGNOSIS — D539 Nutritional anemia, unspecified: Secondary | ICD-10-CM | POA: Diagnosis not present

## 2014-04-09 DIAGNOSIS — J151 Pneumonia due to Pseudomonas: Secondary | ICD-10-CM | POA: Diagnosis not present

## 2014-04-09 DIAGNOSIS — J40 Bronchitis, not specified as acute or chronic: Secondary | ICD-10-CM | POA: Diagnosis not present

## 2014-04-09 DIAGNOSIS — M109 Gout, unspecified: Secondary | ICD-10-CM | POA: Diagnosis not present

## 2014-04-10 DIAGNOSIS — J151 Pneumonia due to Pseudomonas: Secondary | ICD-10-CM | POA: Diagnosis not present

## 2014-04-10 DIAGNOSIS — J40 Bronchitis, not specified as acute or chronic: Secondary | ICD-10-CM | POA: Diagnosis not present

## 2014-04-10 DIAGNOSIS — M109 Gout, unspecified: Secondary | ICD-10-CM | POA: Diagnosis not present

## 2014-04-10 DIAGNOSIS — D539 Nutritional anemia, unspecified: Secondary | ICD-10-CM | POA: Diagnosis not present

## 2014-04-11 DIAGNOSIS — J40 Bronchitis, not specified as acute or chronic: Secondary | ICD-10-CM | POA: Diagnosis not present

## 2014-04-11 DIAGNOSIS — M109 Gout, unspecified: Secondary | ICD-10-CM | POA: Diagnosis not present

## 2014-04-11 DIAGNOSIS — D539 Nutritional anemia, unspecified: Secondary | ICD-10-CM | POA: Diagnosis not present

## 2014-04-11 DIAGNOSIS — N186 End stage renal disease: Secondary | ICD-10-CM | POA: Diagnosis not present

## 2014-04-11 DIAGNOSIS — J151 Pneumonia due to Pseudomonas: Secondary | ICD-10-CM | POA: Diagnosis not present

## 2014-04-12 DIAGNOSIS — D539 Nutritional anemia, unspecified: Secondary | ICD-10-CM | POA: Diagnosis not present

## 2014-04-12 DIAGNOSIS — Z4421 Encounter for fitting and adjustment of artificial right eye: Secondary | ICD-10-CM | POA: Diagnosis not present

## 2014-04-12 DIAGNOSIS — M109 Gout, unspecified: Secondary | ICD-10-CM | POA: Diagnosis not present

## 2014-04-12 DIAGNOSIS — J151 Pneumonia due to Pseudomonas: Secondary | ICD-10-CM | POA: Diagnosis not present

## 2014-04-12 DIAGNOSIS — J40 Bronchitis, not specified as acute or chronic: Secondary | ICD-10-CM | POA: Diagnosis not present

## 2014-04-13 DIAGNOSIS — J151 Pneumonia due to Pseudomonas: Secondary | ICD-10-CM | POA: Diagnosis not present

## 2014-04-13 DIAGNOSIS — N186 End stage renal disease: Secondary | ICD-10-CM | POA: Diagnosis not present

## 2014-04-13 DIAGNOSIS — J40 Bronchitis, not specified as acute or chronic: Secondary | ICD-10-CM | POA: Diagnosis not present

## 2014-04-13 DIAGNOSIS — M109 Gout, unspecified: Secondary | ICD-10-CM | POA: Diagnosis not present

## 2014-04-13 DIAGNOSIS — D539 Nutritional anemia, unspecified: Secondary | ICD-10-CM | POA: Diagnosis not present

## 2014-04-14 DIAGNOSIS — M109 Gout, unspecified: Secondary | ICD-10-CM | POA: Diagnosis not present

## 2014-04-14 DIAGNOSIS — D539 Nutritional anemia, unspecified: Secondary | ICD-10-CM | POA: Diagnosis not present

## 2014-04-14 DIAGNOSIS — J151 Pneumonia due to Pseudomonas: Secondary | ICD-10-CM | POA: Diagnosis not present

## 2014-04-14 DIAGNOSIS — J40 Bronchitis, not specified as acute or chronic: Secondary | ICD-10-CM | POA: Diagnosis not present

## 2014-04-15 DIAGNOSIS — N186 End stage renal disease: Secondary | ICD-10-CM | POA: Diagnosis not present

## 2014-04-15 DIAGNOSIS — D539 Nutritional anemia, unspecified: Secondary | ICD-10-CM | POA: Diagnosis not present

## 2014-04-15 DIAGNOSIS — M109 Gout, unspecified: Secondary | ICD-10-CM | POA: Diagnosis not present

## 2014-04-15 DIAGNOSIS — J151 Pneumonia due to Pseudomonas: Secondary | ICD-10-CM | POA: Diagnosis not present

## 2014-04-15 DIAGNOSIS — J40 Bronchitis, not specified as acute or chronic: Secondary | ICD-10-CM | POA: Diagnosis not present

## 2014-04-18 DIAGNOSIS — N186 End stage renal disease: Secondary | ICD-10-CM | POA: Diagnosis not present

## 2014-04-20 DIAGNOSIS — N186 End stage renal disease: Secondary | ICD-10-CM | POA: Diagnosis not present

## 2014-04-20 DIAGNOSIS — Z992 Dependence on renal dialysis: Secondary | ICD-10-CM | POA: Diagnosis not present

## 2014-04-20 DIAGNOSIS — E1129 Type 2 diabetes mellitus with other diabetic kidney complication: Secondary | ICD-10-CM | POA: Diagnosis not present

## 2014-04-21 DIAGNOSIS — J151 Pneumonia due to Pseudomonas: Secondary | ICD-10-CM | POA: Diagnosis not present

## 2014-04-21 DIAGNOSIS — J40 Bronchitis, not specified as acute or chronic: Secondary | ICD-10-CM | POA: Diagnosis not present

## 2014-04-21 DIAGNOSIS — D539 Nutritional anemia, unspecified: Secondary | ICD-10-CM | POA: Diagnosis not present

## 2014-04-21 DIAGNOSIS — M109 Gout, unspecified: Secondary | ICD-10-CM | POA: Diagnosis not present

## 2014-04-22 DIAGNOSIS — M109 Gout, unspecified: Secondary | ICD-10-CM | POA: Diagnosis not present

## 2014-04-22 DIAGNOSIS — D539 Nutritional anemia, unspecified: Secondary | ICD-10-CM | POA: Diagnosis not present

## 2014-04-22 DIAGNOSIS — J151 Pneumonia due to Pseudomonas: Secondary | ICD-10-CM | POA: Diagnosis not present

## 2014-04-22 DIAGNOSIS — N186 End stage renal disease: Secondary | ICD-10-CM | POA: Diagnosis not present

## 2014-04-22 DIAGNOSIS — J40 Bronchitis, not specified as acute or chronic: Secondary | ICD-10-CM | POA: Diagnosis not present

## 2014-04-23 DIAGNOSIS — M109 Gout, unspecified: Secondary | ICD-10-CM | POA: Diagnosis not present

## 2014-04-23 DIAGNOSIS — J151 Pneumonia due to Pseudomonas: Secondary | ICD-10-CM | POA: Diagnosis not present

## 2014-04-23 DIAGNOSIS — J40 Bronchitis, not specified as acute or chronic: Secondary | ICD-10-CM | POA: Diagnosis not present

## 2014-04-23 DIAGNOSIS — D539 Nutritional anemia, unspecified: Secondary | ICD-10-CM | POA: Diagnosis not present

## 2014-04-24 DIAGNOSIS — J151 Pneumonia due to Pseudomonas: Secondary | ICD-10-CM | POA: Diagnosis not present

## 2014-04-24 DIAGNOSIS — M109 Gout, unspecified: Secondary | ICD-10-CM | POA: Diagnosis not present

## 2014-04-24 DIAGNOSIS — D539 Nutritional anemia, unspecified: Secondary | ICD-10-CM | POA: Diagnosis not present

## 2014-04-24 DIAGNOSIS — J40 Bronchitis, not specified as acute or chronic: Secondary | ICD-10-CM | POA: Diagnosis not present

## 2014-04-25 DIAGNOSIS — N186 End stage renal disease: Secondary | ICD-10-CM | POA: Diagnosis not present

## 2014-04-25 DIAGNOSIS — J40 Bronchitis, not specified as acute or chronic: Secondary | ICD-10-CM | POA: Diagnosis not present

## 2014-04-25 DIAGNOSIS — J151 Pneumonia due to Pseudomonas: Secondary | ICD-10-CM | POA: Diagnosis not present

## 2014-04-25 DIAGNOSIS — D539 Nutritional anemia, unspecified: Secondary | ICD-10-CM | POA: Diagnosis not present

## 2014-04-25 DIAGNOSIS — M109 Gout, unspecified: Secondary | ICD-10-CM | POA: Diagnosis not present

## 2014-04-26 DIAGNOSIS — J151 Pneumonia due to Pseudomonas: Secondary | ICD-10-CM | POA: Diagnosis not present

## 2014-04-26 DIAGNOSIS — M109 Gout, unspecified: Secondary | ICD-10-CM | POA: Diagnosis not present

## 2014-04-26 DIAGNOSIS — D539 Nutritional anemia, unspecified: Secondary | ICD-10-CM | POA: Diagnosis not present

## 2014-04-26 DIAGNOSIS — J40 Bronchitis, not specified as acute or chronic: Secondary | ICD-10-CM | POA: Diagnosis not present

## 2014-04-27 DIAGNOSIS — M109 Gout, unspecified: Secondary | ICD-10-CM | POA: Diagnosis not present

## 2014-04-27 DIAGNOSIS — J151 Pneumonia due to Pseudomonas: Secondary | ICD-10-CM | POA: Diagnosis not present

## 2014-04-27 DIAGNOSIS — D539 Nutritional anemia, unspecified: Secondary | ICD-10-CM | POA: Diagnosis not present

## 2014-04-27 DIAGNOSIS — N186 End stage renal disease: Secondary | ICD-10-CM | POA: Diagnosis not present

## 2014-04-27 DIAGNOSIS — J40 Bronchitis, not specified as acute or chronic: Secondary | ICD-10-CM | POA: Diagnosis not present

## 2014-04-28 DIAGNOSIS — D539 Nutritional anemia, unspecified: Secondary | ICD-10-CM | POA: Diagnosis not present

## 2014-04-28 DIAGNOSIS — J151 Pneumonia due to Pseudomonas: Secondary | ICD-10-CM | POA: Diagnosis not present

## 2014-04-28 DIAGNOSIS — J40 Bronchitis, not specified as acute or chronic: Secondary | ICD-10-CM | POA: Diagnosis not present

## 2014-04-28 DIAGNOSIS — M109 Gout, unspecified: Secondary | ICD-10-CM | POA: Diagnosis not present

## 2014-04-29 DIAGNOSIS — D539 Nutritional anemia, unspecified: Secondary | ICD-10-CM | POA: Diagnosis not present

## 2014-04-29 DIAGNOSIS — N186 End stage renal disease: Secondary | ICD-10-CM | POA: Diagnosis not present

## 2014-04-29 DIAGNOSIS — J40 Bronchitis, not specified as acute or chronic: Secondary | ICD-10-CM | POA: Diagnosis not present

## 2014-04-29 DIAGNOSIS — J151 Pneumonia due to Pseudomonas: Secondary | ICD-10-CM | POA: Diagnosis not present

## 2014-04-29 DIAGNOSIS — M109 Gout, unspecified: Secondary | ICD-10-CM | POA: Diagnosis not present

## 2014-04-30 DIAGNOSIS — D539 Nutritional anemia, unspecified: Secondary | ICD-10-CM | POA: Diagnosis not present

## 2014-04-30 DIAGNOSIS — J151 Pneumonia due to Pseudomonas: Secondary | ICD-10-CM | POA: Diagnosis not present

## 2014-04-30 DIAGNOSIS — J40 Bronchitis, not specified as acute or chronic: Secondary | ICD-10-CM | POA: Diagnosis not present

## 2014-04-30 DIAGNOSIS — M109 Gout, unspecified: Secondary | ICD-10-CM | POA: Diagnosis not present

## 2014-05-01 DIAGNOSIS — J151 Pneumonia due to Pseudomonas: Secondary | ICD-10-CM | POA: Diagnosis not present

## 2014-05-01 DIAGNOSIS — M109 Gout, unspecified: Secondary | ICD-10-CM | POA: Diagnosis not present

## 2014-05-01 DIAGNOSIS — D539 Nutritional anemia, unspecified: Secondary | ICD-10-CM | POA: Diagnosis not present

## 2014-05-01 DIAGNOSIS — J40 Bronchitis, not specified as acute or chronic: Secondary | ICD-10-CM | POA: Diagnosis not present

## 2014-05-02 DIAGNOSIS — M109 Gout, unspecified: Secondary | ICD-10-CM | POA: Diagnosis not present

## 2014-05-02 DIAGNOSIS — D539 Nutritional anemia, unspecified: Secondary | ICD-10-CM | POA: Diagnosis not present

## 2014-05-02 DIAGNOSIS — J151 Pneumonia due to Pseudomonas: Secondary | ICD-10-CM | POA: Diagnosis not present

## 2014-05-02 DIAGNOSIS — N186 End stage renal disease: Secondary | ICD-10-CM | POA: Diagnosis not present

## 2014-05-02 DIAGNOSIS — J40 Bronchitis, not specified as acute or chronic: Secondary | ICD-10-CM | POA: Diagnosis not present

## 2014-05-03 DIAGNOSIS — D539 Nutritional anemia, unspecified: Secondary | ICD-10-CM | POA: Diagnosis not present

## 2014-05-03 DIAGNOSIS — M109 Gout, unspecified: Secondary | ICD-10-CM | POA: Diagnosis not present

## 2014-05-03 DIAGNOSIS — J40 Bronchitis, not specified as acute or chronic: Secondary | ICD-10-CM | POA: Diagnosis not present

## 2014-05-03 DIAGNOSIS — J151 Pneumonia due to Pseudomonas: Secondary | ICD-10-CM | POA: Diagnosis not present

## 2014-05-04 DIAGNOSIS — J40 Bronchitis, not specified as acute or chronic: Secondary | ICD-10-CM | POA: Diagnosis not present

## 2014-05-04 DIAGNOSIS — J151 Pneumonia due to Pseudomonas: Secondary | ICD-10-CM | POA: Diagnosis not present

## 2014-05-04 DIAGNOSIS — D539 Nutritional anemia, unspecified: Secondary | ICD-10-CM | POA: Diagnosis not present

## 2014-05-04 DIAGNOSIS — N186 End stage renal disease: Secondary | ICD-10-CM | POA: Diagnosis not present

## 2014-05-04 DIAGNOSIS — M109 Gout, unspecified: Secondary | ICD-10-CM | POA: Diagnosis not present

## 2014-05-05 DIAGNOSIS — J40 Bronchitis, not specified as acute or chronic: Secondary | ICD-10-CM | POA: Diagnosis not present

## 2014-05-05 DIAGNOSIS — J151 Pneumonia due to Pseudomonas: Secondary | ICD-10-CM | POA: Diagnosis not present

## 2014-05-05 DIAGNOSIS — D539 Nutritional anemia, unspecified: Secondary | ICD-10-CM | POA: Diagnosis not present

## 2014-05-05 DIAGNOSIS — M109 Gout, unspecified: Secondary | ICD-10-CM | POA: Diagnosis not present

## 2014-05-06 DIAGNOSIS — N186 End stage renal disease: Secondary | ICD-10-CM | POA: Diagnosis not present

## 2014-05-06 DIAGNOSIS — D539 Nutritional anemia, unspecified: Secondary | ICD-10-CM | POA: Diagnosis not present

## 2014-05-06 DIAGNOSIS — M109 Gout, unspecified: Secondary | ICD-10-CM | POA: Diagnosis not present

## 2014-05-06 DIAGNOSIS — J40 Bronchitis, not specified as acute or chronic: Secondary | ICD-10-CM | POA: Diagnosis not present

## 2014-05-06 DIAGNOSIS — J151 Pneumonia due to Pseudomonas: Secondary | ICD-10-CM | POA: Diagnosis not present

## 2014-05-07 DIAGNOSIS — D539 Nutritional anemia, unspecified: Secondary | ICD-10-CM | POA: Diagnosis not present

## 2014-05-07 DIAGNOSIS — J151 Pneumonia due to Pseudomonas: Secondary | ICD-10-CM | POA: Diagnosis not present

## 2014-05-07 DIAGNOSIS — M109 Gout, unspecified: Secondary | ICD-10-CM | POA: Diagnosis not present

## 2014-05-07 DIAGNOSIS — J40 Bronchitis, not specified as acute or chronic: Secondary | ICD-10-CM | POA: Diagnosis not present

## 2014-05-08 DIAGNOSIS — M109 Gout, unspecified: Secondary | ICD-10-CM | POA: Diagnosis not present

## 2014-05-08 DIAGNOSIS — D539 Nutritional anemia, unspecified: Secondary | ICD-10-CM | POA: Diagnosis not present

## 2014-05-08 DIAGNOSIS — J40 Bronchitis, not specified as acute or chronic: Secondary | ICD-10-CM | POA: Diagnosis not present

## 2014-05-08 DIAGNOSIS — J151 Pneumonia due to Pseudomonas: Secondary | ICD-10-CM | POA: Diagnosis not present

## 2014-05-09 DIAGNOSIS — J40 Bronchitis, not specified as acute or chronic: Secondary | ICD-10-CM | POA: Diagnosis not present

## 2014-05-09 DIAGNOSIS — J151 Pneumonia due to Pseudomonas: Secondary | ICD-10-CM | POA: Diagnosis not present

## 2014-05-09 DIAGNOSIS — D539 Nutritional anemia, unspecified: Secondary | ICD-10-CM | POA: Diagnosis not present

## 2014-05-09 DIAGNOSIS — M109 Gout, unspecified: Secondary | ICD-10-CM | POA: Diagnosis not present

## 2014-05-09 DIAGNOSIS — N186 End stage renal disease: Secondary | ICD-10-CM | POA: Diagnosis not present

## 2014-05-10 DIAGNOSIS — M109 Gout, unspecified: Secondary | ICD-10-CM | POA: Diagnosis not present

## 2014-05-10 DIAGNOSIS — J151 Pneumonia due to Pseudomonas: Secondary | ICD-10-CM | POA: Diagnosis not present

## 2014-05-10 DIAGNOSIS — D539 Nutritional anemia, unspecified: Secondary | ICD-10-CM | POA: Diagnosis not present

## 2014-05-10 DIAGNOSIS — J40 Bronchitis, not specified as acute or chronic: Secondary | ICD-10-CM | POA: Diagnosis not present

## 2014-05-11 DIAGNOSIS — J151 Pneumonia due to Pseudomonas: Secondary | ICD-10-CM | POA: Diagnosis not present

## 2014-05-11 DIAGNOSIS — N186 End stage renal disease: Secondary | ICD-10-CM | POA: Diagnosis not present

## 2014-05-11 DIAGNOSIS — M109 Gout, unspecified: Secondary | ICD-10-CM | POA: Diagnosis not present

## 2014-05-11 DIAGNOSIS — J40 Bronchitis, not specified as acute or chronic: Secondary | ICD-10-CM | POA: Diagnosis not present

## 2014-05-11 DIAGNOSIS — D539 Nutritional anemia, unspecified: Secondary | ICD-10-CM | POA: Diagnosis not present

## 2014-05-12 DIAGNOSIS — I10 Essential (primary) hypertension: Secondary | ICD-10-CM | POA: Diagnosis not present

## 2014-05-12 DIAGNOSIS — D539 Nutritional anemia, unspecified: Secondary | ICD-10-CM | POA: Diagnosis not present

## 2014-05-12 DIAGNOSIS — M859 Disorder of bone density and structure, unspecified: Secondary | ICD-10-CM | POA: Diagnosis not present

## 2014-05-12 DIAGNOSIS — J151 Pneumonia due to Pseudomonas: Secondary | ICD-10-CM | POA: Diagnosis not present

## 2014-05-12 DIAGNOSIS — E119 Type 2 diabetes mellitus without complications: Secondary | ICD-10-CM | POA: Diagnosis not present

## 2014-05-12 DIAGNOSIS — M109 Gout, unspecified: Secondary | ICD-10-CM | POA: Diagnosis not present

## 2014-05-12 DIAGNOSIS — E785 Hyperlipidemia, unspecified: Secondary | ICD-10-CM | POA: Diagnosis not present

## 2014-05-12 DIAGNOSIS — J40 Bronchitis, not specified as acute or chronic: Secondary | ICD-10-CM | POA: Diagnosis not present

## 2014-05-13 DIAGNOSIS — D539 Nutritional anemia, unspecified: Secondary | ICD-10-CM | POA: Diagnosis not present

## 2014-05-13 DIAGNOSIS — N186 End stage renal disease: Secondary | ICD-10-CM | POA: Diagnosis not present

## 2014-05-13 DIAGNOSIS — M109 Gout, unspecified: Secondary | ICD-10-CM | POA: Diagnosis not present

## 2014-05-13 DIAGNOSIS — J40 Bronchitis, not specified as acute or chronic: Secondary | ICD-10-CM | POA: Diagnosis not present

## 2014-05-13 DIAGNOSIS — J151 Pneumonia due to Pseudomonas: Secondary | ICD-10-CM | POA: Diagnosis not present

## 2014-05-14 DIAGNOSIS — J151 Pneumonia due to Pseudomonas: Secondary | ICD-10-CM | POA: Diagnosis not present

## 2014-05-14 DIAGNOSIS — M109 Gout, unspecified: Secondary | ICD-10-CM | POA: Diagnosis not present

## 2014-05-14 DIAGNOSIS — J40 Bronchitis, not specified as acute or chronic: Secondary | ICD-10-CM | POA: Diagnosis not present

## 2014-05-14 DIAGNOSIS — D539 Nutritional anemia, unspecified: Secondary | ICD-10-CM | POA: Diagnosis not present

## 2014-05-15 DIAGNOSIS — J40 Bronchitis, not specified as acute or chronic: Secondary | ICD-10-CM | POA: Diagnosis not present

## 2014-05-15 DIAGNOSIS — M109 Gout, unspecified: Secondary | ICD-10-CM | POA: Diagnosis not present

## 2014-05-15 DIAGNOSIS — D539 Nutritional anemia, unspecified: Secondary | ICD-10-CM | POA: Diagnosis not present

## 2014-05-15 DIAGNOSIS — J151 Pneumonia due to Pseudomonas: Secondary | ICD-10-CM | POA: Diagnosis not present

## 2014-05-16 DIAGNOSIS — N186 End stage renal disease: Secondary | ICD-10-CM | POA: Diagnosis not present

## 2014-05-18 DIAGNOSIS — N186 End stage renal disease: Secondary | ICD-10-CM | POA: Diagnosis not present

## 2014-05-19 DIAGNOSIS — I10 Essential (primary) hypertension: Secondary | ICD-10-CM | POA: Diagnosis not present

## 2014-05-19 DIAGNOSIS — R109 Unspecified abdominal pain: Secondary | ICD-10-CM | POA: Diagnosis not present

## 2014-05-19 DIAGNOSIS — Z1212 Encounter for screening for malignant neoplasm of rectum: Secondary | ICD-10-CM | POA: Diagnosis not present

## 2014-05-19 DIAGNOSIS — M109 Gout, unspecified: Secondary | ICD-10-CM | POA: Diagnosis not present

## 2014-05-19 DIAGNOSIS — D649 Anemia, unspecified: Secondary | ICD-10-CM | POA: Diagnosis not present

## 2014-05-19 DIAGNOSIS — I129 Hypertensive chronic kidney disease with stage 1 through stage 4 chronic kidney disease, or unspecified chronic kidney disease: Secondary | ICD-10-CM | POA: Diagnosis not present

## 2014-05-19 DIAGNOSIS — N186 End stage renal disease: Secondary | ICD-10-CM | POA: Diagnosis not present

## 2014-05-19 DIAGNOSIS — E1140 Type 2 diabetes mellitus with diabetic neuropathy, unspecified: Secondary | ICD-10-CM | POA: Diagnosis not present

## 2014-05-19 DIAGNOSIS — Z23 Encounter for immunization: Secondary | ICD-10-CM | POA: Diagnosis not present

## 2014-05-19 DIAGNOSIS — Z0000 Encounter for general adult medical examination without abnormal findings: Secondary | ICD-10-CM | POA: Diagnosis not present

## 2014-05-20 DIAGNOSIS — N186 End stage renal disease: Secondary | ICD-10-CM | POA: Diagnosis not present

## 2014-05-20 DIAGNOSIS — Z992 Dependence on renal dialysis: Secondary | ICD-10-CM | POA: Diagnosis not present

## 2014-05-20 DIAGNOSIS — E1129 Type 2 diabetes mellitus with other diabetic kidney complication: Secondary | ICD-10-CM | POA: Diagnosis not present

## 2014-05-21 DIAGNOSIS — J151 Pneumonia due to Pseudomonas: Secondary | ICD-10-CM | POA: Diagnosis not present

## 2014-05-21 DIAGNOSIS — D539 Nutritional anemia, unspecified: Secondary | ICD-10-CM | POA: Diagnosis not present

## 2014-05-21 DIAGNOSIS — J40 Bronchitis, not specified as acute or chronic: Secondary | ICD-10-CM | POA: Diagnosis not present

## 2014-05-21 DIAGNOSIS — M109 Gout, unspecified: Secondary | ICD-10-CM | POA: Diagnosis not present

## 2014-05-22 DIAGNOSIS — M109 Gout, unspecified: Secondary | ICD-10-CM | POA: Diagnosis not present

## 2014-05-22 DIAGNOSIS — D539 Nutritional anemia, unspecified: Secondary | ICD-10-CM | POA: Diagnosis not present

## 2014-05-22 DIAGNOSIS — J151 Pneumonia due to Pseudomonas: Secondary | ICD-10-CM | POA: Diagnosis not present

## 2014-05-22 DIAGNOSIS — J40 Bronchitis, not specified as acute or chronic: Secondary | ICD-10-CM | POA: Diagnosis not present

## 2014-05-23 DIAGNOSIS — J40 Bronchitis, not specified as acute or chronic: Secondary | ICD-10-CM | POA: Diagnosis not present

## 2014-05-23 DIAGNOSIS — M109 Gout, unspecified: Secondary | ICD-10-CM | POA: Diagnosis not present

## 2014-05-23 DIAGNOSIS — D539 Nutritional anemia, unspecified: Secondary | ICD-10-CM | POA: Diagnosis not present

## 2014-05-23 DIAGNOSIS — N186 End stage renal disease: Secondary | ICD-10-CM | POA: Diagnosis not present

## 2014-05-23 DIAGNOSIS — J151 Pneumonia due to Pseudomonas: Secondary | ICD-10-CM | POA: Diagnosis not present

## 2014-05-24 DIAGNOSIS — J40 Bronchitis, not specified as acute or chronic: Secondary | ICD-10-CM | POA: Diagnosis not present

## 2014-05-24 DIAGNOSIS — M109 Gout, unspecified: Secondary | ICD-10-CM | POA: Diagnosis not present

## 2014-05-24 DIAGNOSIS — J151 Pneumonia due to Pseudomonas: Secondary | ICD-10-CM | POA: Diagnosis not present

## 2014-05-24 DIAGNOSIS — D539 Nutritional anemia, unspecified: Secondary | ICD-10-CM | POA: Diagnosis not present

## 2014-05-25 DIAGNOSIS — M109 Gout, unspecified: Secondary | ICD-10-CM | POA: Diagnosis not present

## 2014-05-25 DIAGNOSIS — D539 Nutritional anemia, unspecified: Secondary | ICD-10-CM | POA: Diagnosis not present

## 2014-05-25 DIAGNOSIS — J40 Bronchitis, not specified as acute or chronic: Secondary | ICD-10-CM | POA: Diagnosis not present

## 2014-05-25 DIAGNOSIS — J151 Pneumonia due to Pseudomonas: Secondary | ICD-10-CM | POA: Diagnosis not present

## 2014-05-25 DIAGNOSIS — N186 End stage renal disease: Secondary | ICD-10-CM | POA: Diagnosis not present

## 2014-05-26 DIAGNOSIS — J40 Bronchitis, not specified as acute or chronic: Secondary | ICD-10-CM | POA: Diagnosis not present

## 2014-05-26 DIAGNOSIS — D539 Nutritional anemia, unspecified: Secondary | ICD-10-CM | POA: Diagnosis not present

## 2014-05-26 DIAGNOSIS — J151 Pneumonia due to Pseudomonas: Secondary | ICD-10-CM | POA: Diagnosis not present

## 2014-05-26 DIAGNOSIS — M109 Gout, unspecified: Secondary | ICD-10-CM | POA: Diagnosis not present

## 2014-05-27 DIAGNOSIS — J40 Bronchitis, not specified as acute or chronic: Secondary | ICD-10-CM | POA: Diagnosis not present

## 2014-05-27 DIAGNOSIS — J151 Pneumonia due to Pseudomonas: Secondary | ICD-10-CM | POA: Diagnosis not present

## 2014-05-27 DIAGNOSIS — M109 Gout, unspecified: Secondary | ICD-10-CM | POA: Diagnosis not present

## 2014-05-27 DIAGNOSIS — D539 Nutritional anemia, unspecified: Secondary | ICD-10-CM | POA: Diagnosis not present

## 2014-05-27 DIAGNOSIS — N186 End stage renal disease: Secondary | ICD-10-CM | POA: Diagnosis not present

## 2014-05-28 DIAGNOSIS — J40 Bronchitis, not specified as acute or chronic: Secondary | ICD-10-CM | POA: Diagnosis not present

## 2014-05-28 DIAGNOSIS — M109 Gout, unspecified: Secondary | ICD-10-CM | POA: Diagnosis not present

## 2014-05-28 DIAGNOSIS — J151 Pneumonia due to Pseudomonas: Secondary | ICD-10-CM | POA: Diagnosis not present

## 2014-05-28 DIAGNOSIS — D539 Nutritional anemia, unspecified: Secondary | ICD-10-CM | POA: Diagnosis not present

## 2014-05-29 DIAGNOSIS — J151 Pneumonia due to Pseudomonas: Secondary | ICD-10-CM | POA: Diagnosis not present

## 2014-05-29 DIAGNOSIS — J40 Bronchitis, not specified as acute or chronic: Secondary | ICD-10-CM | POA: Diagnosis not present

## 2014-05-29 DIAGNOSIS — D539 Nutritional anemia, unspecified: Secondary | ICD-10-CM | POA: Diagnosis not present

## 2014-05-29 DIAGNOSIS — M109 Gout, unspecified: Secondary | ICD-10-CM | POA: Diagnosis not present

## 2014-05-30 DIAGNOSIS — M109 Gout, unspecified: Secondary | ICD-10-CM | POA: Diagnosis not present

## 2014-05-30 DIAGNOSIS — J151 Pneumonia due to Pseudomonas: Secondary | ICD-10-CM | POA: Diagnosis not present

## 2014-05-30 DIAGNOSIS — J40 Bronchitis, not specified as acute or chronic: Secondary | ICD-10-CM | POA: Diagnosis not present

## 2014-05-30 DIAGNOSIS — D539 Nutritional anemia, unspecified: Secondary | ICD-10-CM | POA: Diagnosis not present

## 2014-05-30 DIAGNOSIS — N186 End stage renal disease: Secondary | ICD-10-CM | POA: Diagnosis not present

## 2014-05-31 DIAGNOSIS — J151 Pneumonia due to Pseudomonas: Secondary | ICD-10-CM | POA: Diagnosis not present

## 2014-05-31 DIAGNOSIS — J40 Bronchitis, not specified as acute or chronic: Secondary | ICD-10-CM | POA: Diagnosis not present

## 2014-05-31 DIAGNOSIS — D539 Nutritional anemia, unspecified: Secondary | ICD-10-CM | POA: Diagnosis not present

## 2014-05-31 DIAGNOSIS — M109 Gout, unspecified: Secondary | ICD-10-CM | POA: Diagnosis not present

## 2014-06-01 DIAGNOSIS — J40 Bronchitis, not specified as acute or chronic: Secondary | ICD-10-CM | POA: Diagnosis not present

## 2014-06-01 DIAGNOSIS — D539 Nutritional anemia, unspecified: Secondary | ICD-10-CM | POA: Diagnosis not present

## 2014-06-01 DIAGNOSIS — N186 End stage renal disease: Secondary | ICD-10-CM | POA: Diagnosis not present

## 2014-06-01 DIAGNOSIS — M109 Gout, unspecified: Secondary | ICD-10-CM | POA: Diagnosis not present

## 2014-06-01 DIAGNOSIS — J151 Pneumonia due to Pseudomonas: Secondary | ICD-10-CM | POA: Diagnosis not present

## 2014-06-02 DIAGNOSIS — J40 Bronchitis, not specified as acute or chronic: Secondary | ICD-10-CM | POA: Diagnosis not present

## 2014-06-02 DIAGNOSIS — M109 Gout, unspecified: Secondary | ICD-10-CM | POA: Diagnosis not present

## 2014-06-02 DIAGNOSIS — J151 Pneumonia due to Pseudomonas: Secondary | ICD-10-CM | POA: Diagnosis not present

## 2014-06-02 DIAGNOSIS — D539 Nutritional anemia, unspecified: Secondary | ICD-10-CM | POA: Diagnosis not present

## 2014-06-03 DIAGNOSIS — J151 Pneumonia due to Pseudomonas: Secondary | ICD-10-CM | POA: Diagnosis not present

## 2014-06-03 DIAGNOSIS — M109 Gout, unspecified: Secondary | ICD-10-CM | POA: Diagnosis not present

## 2014-06-03 DIAGNOSIS — D539 Nutritional anemia, unspecified: Secondary | ICD-10-CM | POA: Diagnosis not present

## 2014-06-03 DIAGNOSIS — J40 Bronchitis, not specified as acute or chronic: Secondary | ICD-10-CM | POA: Diagnosis not present

## 2014-06-03 DIAGNOSIS — N186 End stage renal disease: Secondary | ICD-10-CM | POA: Diagnosis not present

## 2014-06-04 DIAGNOSIS — J151 Pneumonia due to Pseudomonas: Secondary | ICD-10-CM | POA: Diagnosis not present

## 2014-06-04 DIAGNOSIS — J40 Bronchitis, not specified as acute or chronic: Secondary | ICD-10-CM | POA: Diagnosis not present

## 2014-06-04 DIAGNOSIS — D539 Nutritional anemia, unspecified: Secondary | ICD-10-CM | POA: Diagnosis not present

## 2014-06-04 DIAGNOSIS — M109 Gout, unspecified: Secondary | ICD-10-CM | POA: Diagnosis not present

## 2014-06-05 DIAGNOSIS — D539 Nutritional anemia, unspecified: Secondary | ICD-10-CM | POA: Diagnosis not present

## 2014-06-05 DIAGNOSIS — J151 Pneumonia due to Pseudomonas: Secondary | ICD-10-CM | POA: Diagnosis not present

## 2014-06-05 DIAGNOSIS — J40 Bronchitis, not specified as acute or chronic: Secondary | ICD-10-CM | POA: Diagnosis not present

## 2014-06-05 DIAGNOSIS — M109 Gout, unspecified: Secondary | ICD-10-CM | POA: Diagnosis not present

## 2014-06-06 DIAGNOSIS — J40 Bronchitis, not specified as acute or chronic: Secondary | ICD-10-CM | POA: Diagnosis not present

## 2014-06-06 DIAGNOSIS — N186 End stage renal disease: Secondary | ICD-10-CM | POA: Diagnosis not present

## 2014-06-06 DIAGNOSIS — M109 Gout, unspecified: Secondary | ICD-10-CM | POA: Diagnosis not present

## 2014-06-06 DIAGNOSIS — D539 Nutritional anemia, unspecified: Secondary | ICD-10-CM | POA: Diagnosis not present

## 2014-06-06 DIAGNOSIS — J151 Pneumonia due to Pseudomonas: Secondary | ICD-10-CM | POA: Diagnosis not present

## 2014-06-07 DIAGNOSIS — D539 Nutritional anemia, unspecified: Secondary | ICD-10-CM | POA: Diagnosis not present

## 2014-06-07 DIAGNOSIS — M109 Gout, unspecified: Secondary | ICD-10-CM | POA: Diagnosis not present

## 2014-06-07 DIAGNOSIS — J151 Pneumonia due to Pseudomonas: Secondary | ICD-10-CM | POA: Diagnosis not present

## 2014-06-07 DIAGNOSIS — J40 Bronchitis, not specified as acute or chronic: Secondary | ICD-10-CM | POA: Diagnosis not present

## 2014-06-08 DIAGNOSIS — J40 Bronchitis, not specified as acute or chronic: Secondary | ICD-10-CM | POA: Diagnosis not present

## 2014-06-08 DIAGNOSIS — J151 Pneumonia due to Pseudomonas: Secondary | ICD-10-CM | POA: Diagnosis not present

## 2014-06-08 DIAGNOSIS — M109 Gout, unspecified: Secondary | ICD-10-CM | POA: Diagnosis not present

## 2014-06-08 DIAGNOSIS — N186 End stage renal disease: Secondary | ICD-10-CM | POA: Diagnosis not present

## 2014-06-08 DIAGNOSIS — D539 Nutritional anemia, unspecified: Secondary | ICD-10-CM | POA: Diagnosis not present

## 2014-06-09 DIAGNOSIS — M109 Gout, unspecified: Secondary | ICD-10-CM | POA: Diagnosis not present

## 2014-06-09 DIAGNOSIS — J151 Pneumonia due to Pseudomonas: Secondary | ICD-10-CM | POA: Diagnosis not present

## 2014-06-09 DIAGNOSIS — D539 Nutritional anemia, unspecified: Secondary | ICD-10-CM | POA: Diagnosis not present

## 2014-06-09 DIAGNOSIS — J40 Bronchitis, not specified as acute or chronic: Secondary | ICD-10-CM | POA: Diagnosis not present

## 2014-06-10 DIAGNOSIS — J151 Pneumonia due to Pseudomonas: Secondary | ICD-10-CM | POA: Diagnosis not present

## 2014-06-10 DIAGNOSIS — M109 Gout, unspecified: Secondary | ICD-10-CM | POA: Diagnosis not present

## 2014-06-10 DIAGNOSIS — N186 End stage renal disease: Secondary | ICD-10-CM | POA: Diagnosis not present

## 2014-06-10 DIAGNOSIS — J40 Bronchitis, not specified as acute or chronic: Secondary | ICD-10-CM | POA: Diagnosis not present

## 2014-06-10 DIAGNOSIS — D539 Nutritional anemia, unspecified: Secondary | ICD-10-CM | POA: Diagnosis not present

## 2014-06-11 DIAGNOSIS — J151 Pneumonia due to Pseudomonas: Secondary | ICD-10-CM | POA: Diagnosis not present

## 2014-06-11 DIAGNOSIS — M109 Gout, unspecified: Secondary | ICD-10-CM | POA: Diagnosis not present

## 2014-06-11 DIAGNOSIS — D539 Nutritional anemia, unspecified: Secondary | ICD-10-CM | POA: Diagnosis not present

## 2014-06-11 DIAGNOSIS — J40 Bronchitis, not specified as acute or chronic: Secondary | ICD-10-CM | POA: Diagnosis not present

## 2014-06-12 DIAGNOSIS — M109 Gout, unspecified: Secondary | ICD-10-CM | POA: Diagnosis not present

## 2014-06-12 DIAGNOSIS — J151 Pneumonia due to Pseudomonas: Secondary | ICD-10-CM | POA: Diagnosis not present

## 2014-06-12 DIAGNOSIS — J40 Bronchitis, not specified as acute or chronic: Secondary | ICD-10-CM | POA: Diagnosis not present

## 2014-06-12 DIAGNOSIS — D539 Nutritional anemia, unspecified: Secondary | ICD-10-CM | POA: Diagnosis not present

## 2014-06-13 DIAGNOSIS — J40 Bronchitis, not specified as acute or chronic: Secondary | ICD-10-CM | POA: Diagnosis not present

## 2014-06-13 DIAGNOSIS — M109 Gout, unspecified: Secondary | ICD-10-CM | POA: Diagnosis not present

## 2014-06-13 DIAGNOSIS — N186 End stage renal disease: Secondary | ICD-10-CM | POA: Diagnosis not present

## 2014-06-13 DIAGNOSIS — J151 Pneumonia due to Pseudomonas: Secondary | ICD-10-CM | POA: Diagnosis not present

## 2014-06-13 DIAGNOSIS — D539 Nutritional anemia, unspecified: Secondary | ICD-10-CM | POA: Diagnosis not present

## 2014-06-14 DIAGNOSIS — J40 Bronchitis, not specified as acute or chronic: Secondary | ICD-10-CM | POA: Diagnosis not present

## 2014-06-14 DIAGNOSIS — J151 Pneumonia due to Pseudomonas: Secondary | ICD-10-CM | POA: Diagnosis not present

## 2014-06-14 DIAGNOSIS — M109 Gout, unspecified: Secondary | ICD-10-CM | POA: Diagnosis not present

## 2014-06-14 DIAGNOSIS — D539 Nutritional anemia, unspecified: Secondary | ICD-10-CM | POA: Diagnosis not present

## 2014-06-15 DIAGNOSIS — N186 End stage renal disease: Secondary | ICD-10-CM | POA: Diagnosis not present

## 2014-06-17 DIAGNOSIS — N186 End stage renal disease: Secondary | ICD-10-CM | POA: Diagnosis not present

## 2014-06-20 DIAGNOSIS — Z992 Dependence on renal dialysis: Secondary | ICD-10-CM | POA: Diagnosis not present

## 2014-06-20 DIAGNOSIS — N186 End stage renal disease: Secondary | ICD-10-CM | POA: Diagnosis not present

## 2014-06-20 DIAGNOSIS — E1129 Type 2 diabetes mellitus with other diabetic kidney complication: Secondary | ICD-10-CM | POA: Diagnosis not present

## 2014-06-21 DIAGNOSIS — J40 Bronchitis, not specified as acute or chronic: Secondary | ICD-10-CM | POA: Diagnosis not present

## 2014-06-21 DIAGNOSIS — M109 Gout, unspecified: Secondary | ICD-10-CM | POA: Diagnosis not present

## 2014-06-21 DIAGNOSIS — J151 Pneumonia due to Pseudomonas: Secondary | ICD-10-CM | POA: Diagnosis not present

## 2014-06-21 DIAGNOSIS — D539 Nutritional anemia, unspecified: Secondary | ICD-10-CM | POA: Diagnosis not present

## 2014-06-22 DIAGNOSIS — D539 Nutritional anemia, unspecified: Secondary | ICD-10-CM | POA: Diagnosis not present

## 2014-06-22 DIAGNOSIS — J40 Bronchitis, not specified as acute or chronic: Secondary | ICD-10-CM | POA: Diagnosis not present

## 2014-06-22 DIAGNOSIS — J151 Pneumonia due to Pseudomonas: Secondary | ICD-10-CM | POA: Diagnosis not present

## 2014-06-22 DIAGNOSIS — M109 Gout, unspecified: Secondary | ICD-10-CM | POA: Diagnosis not present

## 2014-06-22 DIAGNOSIS — N186 End stage renal disease: Secondary | ICD-10-CM | POA: Diagnosis not present

## 2014-06-23 DIAGNOSIS — M109 Gout, unspecified: Secondary | ICD-10-CM | POA: Diagnosis not present

## 2014-06-23 DIAGNOSIS — J40 Bronchitis, not specified as acute or chronic: Secondary | ICD-10-CM | POA: Diagnosis not present

## 2014-06-23 DIAGNOSIS — J151 Pneumonia due to Pseudomonas: Secondary | ICD-10-CM | POA: Diagnosis not present

## 2014-06-23 DIAGNOSIS — D539 Nutritional anemia, unspecified: Secondary | ICD-10-CM | POA: Diagnosis not present

## 2014-06-24 DIAGNOSIS — N186 End stage renal disease: Secondary | ICD-10-CM | POA: Diagnosis not present

## 2014-06-24 DIAGNOSIS — J40 Bronchitis, not specified as acute or chronic: Secondary | ICD-10-CM | POA: Diagnosis not present

## 2014-06-24 DIAGNOSIS — J151 Pneumonia due to Pseudomonas: Secondary | ICD-10-CM | POA: Diagnosis not present

## 2014-06-24 DIAGNOSIS — M109 Gout, unspecified: Secondary | ICD-10-CM | POA: Diagnosis not present

## 2014-06-24 DIAGNOSIS — D539 Nutritional anemia, unspecified: Secondary | ICD-10-CM | POA: Diagnosis not present

## 2014-06-25 DIAGNOSIS — J40 Bronchitis, not specified as acute or chronic: Secondary | ICD-10-CM | POA: Diagnosis not present

## 2014-06-25 DIAGNOSIS — J151 Pneumonia due to Pseudomonas: Secondary | ICD-10-CM | POA: Diagnosis not present

## 2014-06-25 DIAGNOSIS — M109 Gout, unspecified: Secondary | ICD-10-CM | POA: Diagnosis not present

## 2014-06-25 DIAGNOSIS — D539 Nutritional anemia, unspecified: Secondary | ICD-10-CM | POA: Diagnosis not present

## 2014-06-26 DIAGNOSIS — D539 Nutritional anemia, unspecified: Secondary | ICD-10-CM | POA: Diagnosis not present

## 2014-06-26 DIAGNOSIS — J40 Bronchitis, not specified as acute or chronic: Secondary | ICD-10-CM | POA: Diagnosis not present

## 2014-06-26 DIAGNOSIS — J151 Pneumonia due to Pseudomonas: Secondary | ICD-10-CM | POA: Diagnosis not present

## 2014-06-26 DIAGNOSIS — M109 Gout, unspecified: Secondary | ICD-10-CM | POA: Diagnosis not present

## 2014-06-27 DIAGNOSIS — D539 Nutritional anemia, unspecified: Secondary | ICD-10-CM | POA: Diagnosis not present

## 2014-06-27 DIAGNOSIS — J151 Pneumonia due to Pseudomonas: Secondary | ICD-10-CM | POA: Diagnosis not present

## 2014-06-27 DIAGNOSIS — M109 Gout, unspecified: Secondary | ICD-10-CM | POA: Diagnosis not present

## 2014-06-27 DIAGNOSIS — N186 End stage renal disease: Secondary | ICD-10-CM | POA: Diagnosis not present

## 2014-06-27 DIAGNOSIS — J40 Bronchitis, not specified as acute or chronic: Secondary | ICD-10-CM | POA: Diagnosis not present

## 2014-06-28 DIAGNOSIS — J40 Bronchitis, not specified as acute or chronic: Secondary | ICD-10-CM | POA: Diagnosis not present

## 2014-06-28 DIAGNOSIS — D539 Nutritional anemia, unspecified: Secondary | ICD-10-CM | POA: Diagnosis not present

## 2014-06-28 DIAGNOSIS — M109 Gout, unspecified: Secondary | ICD-10-CM | POA: Diagnosis not present

## 2014-06-28 DIAGNOSIS — J151 Pneumonia due to Pseudomonas: Secondary | ICD-10-CM | POA: Diagnosis not present

## 2014-06-29 DIAGNOSIS — D539 Nutritional anemia, unspecified: Secondary | ICD-10-CM | POA: Diagnosis not present

## 2014-06-29 DIAGNOSIS — N186 End stage renal disease: Secondary | ICD-10-CM | POA: Diagnosis not present

## 2014-06-29 DIAGNOSIS — J40 Bronchitis, not specified as acute or chronic: Secondary | ICD-10-CM | POA: Diagnosis not present

## 2014-06-29 DIAGNOSIS — M109 Gout, unspecified: Secondary | ICD-10-CM | POA: Diagnosis not present

## 2014-06-29 DIAGNOSIS — J151 Pneumonia due to Pseudomonas: Secondary | ICD-10-CM | POA: Diagnosis not present

## 2014-06-30 DIAGNOSIS — M109 Gout, unspecified: Secondary | ICD-10-CM | POA: Diagnosis not present

## 2014-06-30 DIAGNOSIS — J40 Bronchitis, not specified as acute or chronic: Secondary | ICD-10-CM | POA: Diagnosis not present

## 2014-06-30 DIAGNOSIS — D539 Nutritional anemia, unspecified: Secondary | ICD-10-CM | POA: Diagnosis not present

## 2014-06-30 DIAGNOSIS — J151 Pneumonia due to Pseudomonas: Secondary | ICD-10-CM | POA: Diagnosis not present

## 2014-07-01 DIAGNOSIS — N186 End stage renal disease: Secondary | ICD-10-CM | POA: Diagnosis not present

## 2014-07-01 DIAGNOSIS — J151 Pneumonia due to Pseudomonas: Secondary | ICD-10-CM | POA: Diagnosis not present

## 2014-07-01 DIAGNOSIS — D539 Nutritional anemia, unspecified: Secondary | ICD-10-CM | POA: Diagnosis not present

## 2014-07-01 DIAGNOSIS — J40 Bronchitis, not specified as acute or chronic: Secondary | ICD-10-CM | POA: Diagnosis not present

## 2014-07-01 DIAGNOSIS — M109 Gout, unspecified: Secondary | ICD-10-CM | POA: Diagnosis not present

## 2014-07-02 DIAGNOSIS — J151 Pneumonia due to Pseudomonas: Secondary | ICD-10-CM | POA: Diagnosis not present

## 2014-07-02 DIAGNOSIS — M109 Gout, unspecified: Secondary | ICD-10-CM | POA: Diagnosis not present

## 2014-07-02 DIAGNOSIS — D539 Nutritional anemia, unspecified: Secondary | ICD-10-CM | POA: Diagnosis not present

## 2014-07-02 DIAGNOSIS — J40 Bronchitis, not specified as acute or chronic: Secondary | ICD-10-CM | POA: Diagnosis not present

## 2014-07-03 DIAGNOSIS — D539 Nutritional anemia, unspecified: Secondary | ICD-10-CM | POA: Diagnosis not present

## 2014-07-03 DIAGNOSIS — M109 Gout, unspecified: Secondary | ICD-10-CM | POA: Diagnosis not present

## 2014-07-03 DIAGNOSIS — J151 Pneumonia due to Pseudomonas: Secondary | ICD-10-CM | POA: Diagnosis not present

## 2014-07-03 DIAGNOSIS — J40 Bronchitis, not specified as acute or chronic: Secondary | ICD-10-CM | POA: Diagnosis not present

## 2014-07-04 DIAGNOSIS — J40 Bronchitis, not specified as acute or chronic: Secondary | ICD-10-CM | POA: Diagnosis not present

## 2014-07-04 DIAGNOSIS — D539 Nutritional anemia, unspecified: Secondary | ICD-10-CM | POA: Diagnosis not present

## 2014-07-04 DIAGNOSIS — N186 End stage renal disease: Secondary | ICD-10-CM | POA: Diagnosis not present

## 2014-07-04 DIAGNOSIS — M109 Gout, unspecified: Secondary | ICD-10-CM | POA: Diagnosis not present

## 2014-07-04 DIAGNOSIS — J151 Pneumonia due to Pseudomonas: Secondary | ICD-10-CM | POA: Diagnosis not present

## 2014-07-05 DIAGNOSIS — J151 Pneumonia due to Pseudomonas: Secondary | ICD-10-CM | POA: Diagnosis not present

## 2014-07-05 DIAGNOSIS — D539 Nutritional anemia, unspecified: Secondary | ICD-10-CM | POA: Diagnosis not present

## 2014-07-05 DIAGNOSIS — M109 Gout, unspecified: Secondary | ICD-10-CM | POA: Diagnosis not present

## 2014-07-05 DIAGNOSIS — J40 Bronchitis, not specified as acute or chronic: Secondary | ICD-10-CM | POA: Diagnosis not present

## 2014-07-06 DIAGNOSIS — D539 Nutritional anemia, unspecified: Secondary | ICD-10-CM | POA: Diagnosis not present

## 2014-07-06 DIAGNOSIS — N186 End stage renal disease: Secondary | ICD-10-CM | POA: Diagnosis not present

## 2014-07-06 DIAGNOSIS — M109 Gout, unspecified: Secondary | ICD-10-CM | POA: Diagnosis not present

## 2014-07-06 DIAGNOSIS — J151 Pneumonia due to Pseudomonas: Secondary | ICD-10-CM | POA: Diagnosis not present

## 2014-07-06 DIAGNOSIS — J40 Bronchitis, not specified as acute or chronic: Secondary | ICD-10-CM | POA: Diagnosis not present

## 2014-07-07 DIAGNOSIS — M109 Gout, unspecified: Secondary | ICD-10-CM | POA: Diagnosis not present

## 2014-07-07 DIAGNOSIS — J40 Bronchitis, not specified as acute or chronic: Secondary | ICD-10-CM | POA: Diagnosis not present

## 2014-07-07 DIAGNOSIS — D539 Nutritional anemia, unspecified: Secondary | ICD-10-CM | POA: Diagnosis not present

## 2014-07-07 DIAGNOSIS — J151 Pneumonia due to Pseudomonas: Secondary | ICD-10-CM | POA: Diagnosis not present

## 2014-07-08 DIAGNOSIS — J40 Bronchitis, not specified as acute or chronic: Secondary | ICD-10-CM | POA: Diagnosis not present

## 2014-07-08 DIAGNOSIS — N186 End stage renal disease: Secondary | ICD-10-CM | POA: Diagnosis not present

## 2014-07-08 DIAGNOSIS — M109 Gout, unspecified: Secondary | ICD-10-CM | POA: Diagnosis not present

## 2014-07-08 DIAGNOSIS — J151 Pneumonia due to Pseudomonas: Secondary | ICD-10-CM | POA: Diagnosis not present

## 2014-07-08 DIAGNOSIS — D539 Nutritional anemia, unspecified: Secondary | ICD-10-CM | POA: Diagnosis not present

## 2014-07-09 DIAGNOSIS — J40 Bronchitis, not specified as acute or chronic: Secondary | ICD-10-CM | POA: Diagnosis not present

## 2014-07-09 DIAGNOSIS — M109 Gout, unspecified: Secondary | ICD-10-CM | POA: Diagnosis not present

## 2014-07-09 DIAGNOSIS — D539 Nutritional anemia, unspecified: Secondary | ICD-10-CM | POA: Diagnosis not present

## 2014-07-09 DIAGNOSIS — J151 Pneumonia due to Pseudomonas: Secondary | ICD-10-CM | POA: Diagnosis not present

## 2014-07-10 DIAGNOSIS — M109 Gout, unspecified: Secondary | ICD-10-CM | POA: Diagnosis not present

## 2014-07-10 DIAGNOSIS — J151 Pneumonia due to Pseudomonas: Secondary | ICD-10-CM | POA: Diagnosis not present

## 2014-07-10 DIAGNOSIS — D539 Nutritional anemia, unspecified: Secondary | ICD-10-CM | POA: Diagnosis not present

## 2014-07-10 DIAGNOSIS — J40 Bronchitis, not specified as acute or chronic: Secondary | ICD-10-CM | POA: Diagnosis not present

## 2014-07-11 DIAGNOSIS — D539 Nutritional anemia, unspecified: Secondary | ICD-10-CM | POA: Diagnosis not present

## 2014-07-11 DIAGNOSIS — J40 Bronchitis, not specified as acute or chronic: Secondary | ICD-10-CM | POA: Diagnosis not present

## 2014-07-11 DIAGNOSIS — J151 Pneumonia due to Pseudomonas: Secondary | ICD-10-CM | POA: Diagnosis not present

## 2014-07-11 DIAGNOSIS — N186 End stage renal disease: Secondary | ICD-10-CM | POA: Diagnosis not present

## 2014-07-11 DIAGNOSIS — M109 Gout, unspecified: Secondary | ICD-10-CM | POA: Diagnosis not present

## 2014-07-12 DIAGNOSIS — M109 Gout, unspecified: Secondary | ICD-10-CM | POA: Diagnosis not present

## 2014-07-12 DIAGNOSIS — D539 Nutritional anemia, unspecified: Secondary | ICD-10-CM | POA: Diagnosis not present

## 2014-07-12 DIAGNOSIS — J151 Pneumonia due to Pseudomonas: Secondary | ICD-10-CM | POA: Diagnosis not present

## 2014-07-12 DIAGNOSIS — J40 Bronchitis, not specified as acute or chronic: Secondary | ICD-10-CM | POA: Diagnosis not present

## 2014-07-13 DIAGNOSIS — D539 Nutritional anemia, unspecified: Secondary | ICD-10-CM | POA: Diagnosis not present

## 2014-07-13 DIAGNOSIS — N186 End stage renal disease: Secondary | ICD-10-CM | POA: Diagnosis not present

## 2014-07-13 DIAGNOSIS — J40 Bronchitis, not specified as acute or chronic: Secondary | ICD-10-CM | POA: Diagnosis not present

## 2014-07-13 DIAGNOSIS — M109 Gout, unspecified: Secondary | ICD-10-CM | POA: Diagnosis not present

## 2014-07-13 DIAGNOSIS — J151 Pneumonia due to Pseudomonas: Secondary | ICD-10-CM | POA: Diagnosis not present

## 2014-07-14 DIAGNOSIS — M109 Gout, unspecified: Secondary | ICD-10-CM | POA: Diagnosis not present

## 2014-07-14 DIAGNOSIS — D539 Nutritional anemia, unspecified: Secondary | ICD-10-CM | POA: Diagnosis not present

## 2014-07-14 DIAGNOSIS — J151 Pneumonia due to Pseudomonas: Secondary | ICD-10-CM | POA: Diagnosis not present

## 2014-07-14 DIAGNOSIS — J40 Bronchitis, not specified as acute or chronic: Secondary | ICD-10-CM | POA: Diagnosis not present

## 2014-07-15 DIAGNOSIS — N186 End stage renal disease: Secondary | ICD-10-CM | POA: Diagnosis not present

## 2014-07-15 DIAGNOSIS — M109 Gout, unspecified: Secondary | ICD-10-CM | POA: Diagnosis not present

## 2014-07-15 DIAGNOSIS — J40 Bronchitis, not specified as acute or chronic: Secondary | ICD-10-CM | POA: Diagnosis not present

## 2014-07-15 DIAGNOSIS — J151 Pneumonia due to Pseudomonas: Secondary | ICD-10-CM | POA: Diagnosis not present

## 2014-07-15 DIAGNOSIS — D539 Nutritional anemia, unspecified: Secondary | ICD-10-CM | POA: Diagnosis not present

## 2014-07-18 DIAGNOSIS — N186 End stage renal disease: Secondary | ICD-10-CM | POA: Diagnosis not present

## 2014-07-20 DIAGNOSIS — Z992 Dependence on renal dialysis: Secondary | ICD-10-CM | POA: Diagnosis not present

## 2014-07-20 DIAGNOSIS — N186 End stage renal disease: Secondary | ICD-10-CM | POA: Diagnosis not present

## 2014-07-20 DIAGNOSIS — E1129 Type 2 diabetes mellitus with other diabetic kidney complication: Secondary | ICD-10-CM | POA: Diagnosis not present

## 2014-07-21 DIAGNOSIS — D539 Nutritional anemia, unspecified: Secondary | ICD-10-CM | POA: Diagnosis not present

## 2014-07-21 DIAGNOSIS — M109 Gout, unspecified: Secondary | ICD-10-CM | POA: Diagnosis not present

## 2014-07-21 DIAGNOSIS — J151 Pneumonia due to Pseudomonas: Secondary | ICD-10-CM | POA: Diagnosis not present

## 2014-07-21 DIAGNOSIS — J40 Bronchitis, not specified as acute or chronic: Secondary | ICD-10-CM | POA: Diagnosis not present

## 2014-07-22 DIAGNOSIS — N186 End stage renal disease: Secondary | ICD-10-CM | POA: Diagnosis not present

## 2014-07-22 DIAGNOSIS — M109 Gout, unspecified: Secondary | ICD-10-CM | POA: Diagnosis not present

## 2014-07-22 DIAGNOSIS — J40 Bronchitis, not specified as acute or chronic: Secondary | ICD-10-CM | POA: Diagnosis not present

## 2014-07-22 DIAGNOSIS — J151 Pneumonia due to Pseudomonas: Secondary | ICD-10-CM | POA: Diagnosis not present

## 2014-07-22 DIAGNOSIS — D539 Nutritional anemia, unspecified: Secondary | ICD-10-CM | POA: Diagnosis not present

## 2014-07-23 DIAGNOSIS — J151 Pneumonia due to Pseudomonas: Secondary | ICD-10-CM | POA: Diagnosis not present

## 2014-07-23 DIAGNOSIS — J40 Bronchitis, not specified as acute or chronic: Secondary | ICD-10-CM | POA: Diagnosis not present

## 2014-07-23 DIAGNOSIS — D539 Nutritional anemia, unspecified: Secondary | ICD-10-CM | POA: Diagnosis not present

## 2014-07-23 DIAGNOSIS — M109 Gout, unspecified: Secondary | ICD-10-CM | POA: Diagnosis not present

## 2014-07-24 DIAGNOSIS — J40 Bronchitis, not specified as acute or chronic: Secondary | ICD-10-CM | POA: Diagnosis not present

## 2014-07-24 DIAGNOSIS — J151 Pneumonia due to Pseudomonas: Secondary | ICD-10-CM | POA: Diagnosis not present

## 2014-07-24 DIAGNOSIS — M109 Gout, unspecified: Secondary | ICD-10-CM | POA: Diagnosis not present

## 2014-07-24 DIAGNOSIS — D539 Nutritional anemia, unspecified: Secondary | ICD-10-CM | POA: Diagnosis not present

## 2014-07-25 DIAGNOSIS — N186 End stage renal disease: Secondary | ICD-10-CM | POA: Diagnosis not present

## 2014-07-25 DIAGNOSIS — J40 Bronchitis, not specified as acute or chronic: Secondary | ICD-10-CM | POA: Diagnosis not present

## 2014-07-25 DIAGNOSIS — J151 Pneumonia due to Pseudomonas: Secondary | ICD-10-CM | POA: Diagnosis not present

## 2014-07-25 DIAGNOSIS — D539 Nutritional anemia, unspecified: Secondary | ICD-10-CM | POA: Diagnosis not present

## 2014-07-25 DIAGNOSIS — M109 Gout, unspecified: Secondary | ICD-10-CM | POA: Diagnosis not present

## 2014-07-26 DIAGNOSIS — M109 Gout, unspecified: Secondary | ICD-10-CM | POA: Diagnosis not present

## 2014-07-26 DIAGNOSIS — D539 Nutritional anemia, unspecified: Secondary | ICD-10-CM | POA: Diagnosis not present

## 2014-07-26 DIAGNOSIS — J151 Pneumonia due to Pseudomonas: Secondary | ICD-10-CM | POA: Diagnosis not present

## 2014-07-26 DIAGNOSIS — J40 Bronchitis, not specified as acute or chronic: Secondary | ICD-10-CM | POA: Diagnosis not present

## 2014-07-27 DIAGNOSIS — J40 Bronchitis, not specified as acute or chronic: Secondary | ICD-10-CM | POA: Diagnosis not present

## 2014-07-27 DIAGNOSIS — J151 Pneumonia due to Pseudomonas: Secondary | ICD-10-CM | POA: Diagnosis not present

## 2014-07-27 DIAGNOSIS — D539 Nutritional anemia, unspecified: Secondary | ICD-10-CM | POA: Diagnosis not present

## 2014-07-27 DIAGNOSIS — N186 End stage renal disease: Secondary | ICD-10-CM | POA: Diagnosis not present

## 2014-07-27 DIAGNOSIS — M109 Gout, unspecified: Secondary | ICD-10-CM | POA: Diagnosis not present

## 2014-07-28 DIAGNOSIS — J40 Bronchitis, not specified as acute or chronic: Secondary | ICD-10-CM | POA: Diagnosis not present

## 2014-07-28 DIAGNOSIS — M109 Gout, unspecified: Secondary | ICD-10-CM | POA: Diagnosis not present

## 2014-07-28 DIAGNOSIS — J151 Pneumonia due to Pseudomonas: Secondary | ICD-10-CM | POA: Diagnosis not present

## 2014-07-28 DIAGNOSIS — D539 Nutritional anemia, unspecified: Secondary | ICD-10-CM | POA: Diagnosis not present

## 2014-07-29 DIAGNOSIS — N186 End stage renal disease: Secondary | ICD-10-CM | POA: Diagnosis not present

## 2014-07-29 DIAGNOSIS — D539 Nutritional anemia, unspecified: Secondary | ICD-10-CM | POA: Diagnosis not present

## 2014-07-29 DIAGNOSIS — M109 Gout, unspecified: Secondary | ICD-10-CM | POA: Diagnosis not present

## 2014-07-29 DIAGNOSIS — J40 Bronchitis, not specified as acute or chronic: Secondary | ICD-10-CM | POA: Diagnosis not present

## 2014-07-29 DIAGNOSIS — J151 Pneumonia due to Pseudomonas: Secondary | ICD-10-CM | POA: Diagnosis not present

## 2014-07-30 DIAGNOSIS — M109 Gout, unspecified: Secondary | ICD-10-CM | POA: Diagnosis not present

## 2014-07-30 DIAGNOSIS — J151 Pneumonia due to Pseudomonas: Secondary | ICD-10-CM | POA: Diagnosis not present

## 2014-07-30 DIAGNOSIS — J40 Bronchitis, not specified as acute or chronic: Secondary | ICD-10-CM | POA: Diagnosis not present

## 2014-07-30 DIAGNOSIS — D539 Nutritional anemia, unspecified: Secondary | ICD-10-CM | POA: Diagnosis not present

## 2014-07-31 DIAGNOSIS — D539 Nutritional anemia, unspecified: Secondary | ICD-10-CM | POA: Diagnosis not present

## 2014-07-31 DIAGNOSIS — M109 Gout, unspecified: Secondary | ICD-10-CM | POA: Diagnosis not present

## 2014-07-31 DIAGNOSIS — J40 Bronchitis, not specified as acute or chronic: Secondary | ICD-10-CM | POA: Diagnosis not present

## 2014-07-31 DIAGNOSIS — J151 Pneumonia due to Pseudomonas: Secondary | ICD-10-CM | POA: Diagnosis not present

## 2014-08-01 DIAGNOSIS — N186 End stage renal disease: Secondary | ICD-10-CM | POA: Diagnosis not present

## 2014-08-01 DIAGNOSIS — D539 Nutritional anemia, unspecified: Secondary | ICD-10-CM | POA: Diagnosis not present

## 2014-08-01 DIAGNOSIS — J151 Pneumonia due to Pseudomonas: Secondary | ICD-10-CM | POA: Diagnosis not present

## 2014-08-01 DIAGNOSIS — M109 Gout, unspecified: Secondary | ICD-10-CM | POA: Diagnosis not present

## 2014-08-01 DIAGNOSIS — J40 Bronchitis, not specified as acute or chronic: Secondary | ICD-10-CM | POA: Diagnosis not present

## 2014-08-02 DIAGNOSIS — J40 Bronchitis, not specified as acute or chronic: Secondary | ICD-10-CM | POA: Diagnosis not present

## 2014-08-02 DIAGNOSIS — D539 Nutritional anemia, unspecified: Secondary | ICD-10-CM | POA: Diagnosis not present

## 2014-08-02 DIAGNOSIS — J151 Pneumonia due to Pseudomonas: Secondary | ICD-10-CM | POA: Diagnosis not present

## 2014-08-02 DIAGNOSIS — M109 Gout, unspecified: Secondary | ICD-10-CM | POA: Diagnosis not present

## 2014-08-03 DIAGNOSIS — D539 Nutritional anemia, unspecified: Secondary | ICD-10-CM | POA: Diagnosis not present

## 2014-08-03 DIAGNOSIS — J151 Pneumonia due to Pseudomonas: Secondary | ICD-10-CM | POA: Diagnosis not present

## 2014-08-03 DIAGNOSIS — J40 Bronchitis, not specified as acute or chronic: Secondary | ICD-10-CM | POA: Diagnosis not present

## 2014-08-03 DIAGNOSIS — N186 End stage renal disease: Secondary | ICD-10-CM | POA: Diagnosis not present

## 2014-08-03 DIAGNOSIS — M109 Gout, unspecified: Secondary | ICD-10-CM | POA: Diagnosis not present

## 2014-08-04 DIAGNOSIS — D539 Nutritional anemia, unspecified: Secondary | ICD-10-CM | POA: Diagnosis not present

## 2014-08-04 DIAGNOSIS — M109 Gout, unspecified: Secondary | ICD-10-CM | POA: Diagnosis not present

## 2014-08-04 DIAGNOSIS — J151 Pneumonia due to Pseudomonas: Secondary | ICD-10-CM | POA: Diagnosis not present

## 2014-08-04 DIAGNOSIS — J40 Bronchitis, not specified as acute or chronic: Secondary | ICD-10-CM | POA: Diagnosis not present

## 2014-08-05 ENCOUNTER — Inpatient Hospital Stay (HOSPITAL_COMMUNITY)
Admission: EM | Admit: 2014-08-05 | Discharge: 2014-08-05 | DRG: 640 | Disposition: A | Discharge status: Home / Self Care | Payer: Medicare HMO | Attending: Nephrology | Admitting: Nephrology

## 2014-08-05 ENCOUNTER — Encounter (HOSPITAL_COMMUNITY): Payer: Self-pay | Admitting: *Deleted

## 2014-08-05 DIAGNOSIS — Z6841 Body Mass Index (BMI) 40.0 and over, adult: Secondary | ICD-10-CM

## 2014-08-05 DIAGNOSIS — Y828 Other medical devices associated with adverse incidents: Secondary | ICD-10-CM | POA: Diagnosis present

## 2014-08-05 DIAGNOSIS — Z79899 Other long term (current) drug therapy: Secondary | ICD-10-CM | POA: Diagnosis not present

## 2014-08-05 DIAGNOSIS — I509 Heart failure, unspecified: Secondary | ICD-10-CM | POA: Diagnosis present

## 2014-08-05 DIAGNOSIS — Z794 Long term (current) use of insulin: Secondary | ICD-10-CM

## 2014-08-05 DIAGNOSIS — T85698A Other mechanical complication of other specified internal prosthetic devices, implants and grafts, initial encounter: Secondary | ICD-10-CM | POA: Diagnosis not present

## 2014-08-05 DIAGNOSIS — I2510 Atherosclerotic heart disease of native coronary artery without angina pectoris: Secondary | ICD-10-CM | POA: Diagnosis not present

## 2014-08-05 DIAGNOSIS — M109 Gout, unspecified: Secondary | ICD-10-CM | POA: Diagnosis present

## 2014-08-05 DIAGNOSIS — T82898A Other specified complication of vascular prosthetic devices, implants and grafts, initial encounter: Secondary | ICD-10-CM | POA: Diagnosis not present

## 2014-08-05 DIAGNOSIS — D539 Nutritional anemia, unspecified: Secondary | ICD-10-CM | POA: Diagnosis not present

## 2014-08-05 DIAGNOSIS — M1990 Unspecified osteoarthritis, unspecified site: Secondary | ICD-10-CM | POA: Diagnosis present

## 2014-08-05 DIAGNOSIS — E875 Hyperkalemia: Secondary | ICD-10-CM | POA: Diagnosis not present

## 2014-08-05 DIAGNOSIS — N186 End stage renal disease: Secondary | ICD-10-CM | POA: Diagnosis present

## 2014-08-05 DIAGNOSIS — G4733 Obstructive sleep apnea (adult) (pediatric): Secondary | ICD-10-CM | POA: Diagnosis present

## 2014-08-05 DIAGNOSIS — E1140 Type 2 diabetes mellitus with diabetic neuropathy, unspecified: Secondary | ICD-10-CM | POA: Diagnosis present

## 2014-08-05 DIAGNOSIS — M545 Low back pain: Secondary | ICD-10-CM | POA: Diagnosis present

## 2014-08-05 DIAGNOSIS — G8929 Other chronic pain: Secondary | ICD-10-CM | POA: Diagnosis present

## 2014-08-05 DIAGNOSIS — I120 Hypertensive chronic kidney disease with stage 5 chronic kidney disease or end stage renal disease: Secondary | ICD-10-CM | POA: Diagnosis present

## 2014-08-05 DIAGNOSIS — T82591A Other mechanical complication of surgically created arteriovenous shunt, initial encounter: Secondary | ICD-10-CM

## 2014-08-05 DIAGNOSIS — E1122 Type 2 diabetes mellitus with diabetic chronic kidney disease: Secondary | ICD-10-CM | POA: Diagnosis present

## 2014-08-05 DIAGNOSIS — D649 Anemia, unspecified: Secondary | ICD-10-CM | POA: Diagnosis present

## 2014-08-05 DIAGNOSIS — R011 Cardiac murmur, unspecified: Secondary | ICD-10-CM | POA: Diagnosis present

## 2014-08-05 DIAGNOSIS — Z992 Dependence on renal dialysis: Secondary | ICD-10-CM

## 2014-08-05 DIAGNOSIS — T8249XA Other complication of vascular dialysis catheter, initial encounter: Secondary | ICD-10-CM | POA: Diagnosis not present

## 2014-08-05 DIAGNOSIS — Z7982 Long term (current) use of aspirin: Secondary | ICD-10-CM | POA: Diagnosis not present

## 2014-08-05 DIAGNOSIS — J40 Bronchitis, not specified as acute or chronic: Secondary | ICD-10-CM | POA: Diagnosis not present

## 2014-08-05 DIAGNOSIS — J151 Pneumonia due to Pseudomonas: Secondary | ICD-10-CM | POA: Diagnosis not present

## 2014-08-05 DIAGNOSIS — I252 Old myocardial infarction: Secondary | ICD-10-CM | POA: Diagnosis not present

## 2014-08-05 LAB — BASIC METABOLIC PANEL
Anion gap: 14 (ref 5–15)
BUN: 62 mg/dL — ABNORMAL HIGH (ref 6–20)
CO2: 24 mmol/L (ref 22–32)
Calcium: 9.7 mg/dL (ref 8.9–10.3)
Chloride: 97 mmol/L — ABNORMAL LOW (ref 101–111)
Creatinine, Ser: 10.79 mg/dL — ABNORMAL HIGH (ref 0.44–1.00)
GFR calc Af Amer: 4 mL/min — ABNORMAL LOW (ref >60)
GFR calc non Af Amer: 3 mL/min — ABNORMAL LOW (ref >60)
Glucose, Bld: 176 mg/dL — ABNORMAL HIGH (ref 65–99)
Potassium: 6.4 mmol/L (ref 3.5–5.1)
Sodium: 135 mmol/L (ref 135–145)

## 2014-08-05 LAB — POTASSIUM: Potassium: 3.1 mmol/L — ABNORMAL LOW (ref 3.5–5.1)

## 2014-08-05 LAB — CBG MONITORING, ED: GLUCOSE-CAPILLARY: 196 mg/dL — AB (ref 65–99)

## 2014-08-05 MED ORDER — SODIUM POLYSTYRENE SULFONATE 15 GM/60ML PO SUSP: 30.0000 g | Freq: Once | ORAL | Status: DC

## 2014-08-05 MED ORDER — ONDANSETRON HCL 4 MG/2ML IJ SOLN: 4.0000 mg | Freq: Four times a day (QID) | INTRAMUSCULAR | Status: DC | PRN

## 2014-08-05 MED ORDER — ONDANSETRON HCL 4 MG PO TABS
4.0000 mg | ORAL_TABLET | Freq: Four times a day (QID) | ORAL | Status: DC | PRN
Start: 2014-08-05 — End: 2014-08-05

## 2014-08-05 MED ORDER — DEXTROSE 50 % IV SOLN: 1.0000 | Freq: Once | INTRAVENOUS | Status: DC

## 2014-08-05 MED ORDER — NEPRO/CARBSTEADY PO LIQD: 237.0000 mL | Freq: Three times a day (TID) | ORAL | Status: DC | PRN

## 2014-08-05 MED ORDER — DOCUSATE SODIUM 283 MG RE ENEM: 1.0000 | ENEMA | RECTAL | Status: DC | PRN

## 2014-08-05 MED ORDER — INSULIN ASPART 100 UNIT/ML ~~LOC~~ SOLN: 10.0000 [IU] | Freq: Once | SUBCUTANEOUS | Status: DC

## 2014-08-05 MED ORDER — CALCITRIOL 0.5 MCG PO CAPS
1.7500 ug | ORAL_CAPSULE | ORAL | Status: DC
  Filled 2014-08-05: qty 1

## 2014-08-05 MED ORDER — HYDROXYZINE HCL 25 MG PO TABS: 25.0000 mg | ORAL_TABLET | Freq: Three times a day (TID) | ORAL | Status: DC | PRN

## 2014-08-05 MED ORDER — ZOLPIDEM TARTRATE 5 MG PO TABS: 5.0000 mg | ORAL_TABLET | Freq: Every evening | ORAL | Status: DC | PRN

## 2014-08-05 MED ORDER — CALCIUM GLUCONATE 10 % IV SOLN: 1.0000 g | Freq: Once | INTRAVENOUS | Status: DC

## 2014-08-05 MED ORDER — CALCIUM CARBONATE 1250 MG/5ML PO SUSP: 500.0000 mg | Freq: Four times a day (QID) | ORAL | Status: DC | PRN

## 2014-08-05 MED ORDER — PENTAFLUOROPROP-TETRAFLUOROETH EX AERO
INHALATION_SPRAY | CUTANEOUS | Status: AC
  Filled 2014-08-05: qty 103.5

## 2014-08-05 MED ORDER — CAMPHOR-MENTHOL 0.5-0.5 % EX LOTN
1.0000 "application " | TOPICAL_LOTION | Freq: Three times a day (TID) | CUTANEOUS | Status: DC | PRN
  Filled 2014-08-05: qty 222

## 2014-08-05 MED ORDER — ACETAMINOPHEN 650 MG RE SUPP: 650.0000 mg | Freq: Four times a day (QID) | RECTAL | Status: DC | PRN

## 2014-08-05 MED ORDER — DOCUSATE SODIUM 283 MG RE ENEM
1.0000 | ENEMA | RECTAL | Status: DC | PRN
  Filled 2014-08-05: qty 1

## 2014-08-05 MED ORDER — ONDANSETRON HCL 4 MG PO TABS: 4.0000 mg | ORAL_TABLET | Freq: Four times a day (QID) | ORAL | Status: DC | PRN

## 2014-08-05 MED ORDER — ACETAMINOPHEN 325 MG PO TABS: 650.0000 mg | ORAL_TABLET | Freq: Four times a day (QID) | ORAL | Status: DC | PRN

## 2014-08-05 MED ORDER — SORBITOL 70 % SOLN: 30.0000 mL | Status: DC | PRN

## 2014-08-05 MED ORDER — CAMPHOR-MENTHOL 0.5-0.5 % EX LOTN: 1.0000 "application " | TOPICAL_LOTION | Freq: Three times a day (TID) | CUTANEOUS | Status: DC | PRN

## 2014-08-05 NOTE — ED Notes (Signed)
Pt presents this morning with a reported vascular access problem. Pt was at her dialysis center and her shunt was found to be clotted. Pt denies any other difficulties .

## 2014-08-05 NOTE — H&P (Signed)
Short History and Physical Form [facility]  08/05/2014,12:30 PM  LOS: 0 days   HPI:  [name] is an 69 y.o. female with ESRD secondary to diabetes on TTS HD at [place] presented to her HD center with a clotted left upper AVF.  Staff sent her to the ED for evaluation.  K found to be 6.4  She last had a fistulagram in January with 30% stenosis and no intervention. Prior to that, she had an intervention December 2015.  She feels fine today with out SOB, CP, N, V, D.  She tends to run low BPs in the 90 - 100s during her HD treatments with modest weight gains and gets to or close to EDW.  Past Medical History  Diagnosis Date  . Abdominal abscess 12-17-10    abdominal abscesses x2 ? one at this time  . Staph aureus infection November 2012   . Swelling of both ankles 12-17-10  . Anemia   . Coronary artery disease     Dr Kadakia-cardiologist 2-3 x year  . CHF (congestive heart failure)   . Dysrhythmia     irregular, "skips beats"  . Hypertension     sees Dr. Willey Blade  . Neuromuscular disorder     diabetic neuropathy  . Blind right eye     fell and crushed socket and eye fell out, was replaced at [facility]  . Heart murmur   . Myocardial infarction 1970's or 1980's  . Exertional shortness of breath   . Inhalation injury     "worked at [employer]; can't inhale polyurethane or paint, etc" (08/23/2012)  . OSA on CPAP   . Type II diabetes mellitus     "over 10 years now" (08/23/2012)  . History of blood transfusion     "lots before starting dialysis" (08/23/2012)  . H/O hiatal hernia   . Headache(784.0)     "weekly sometimes; since I fell and hit my head in 07/2011 08/23/2012)  . Eye drainage     "lots; since fall 07/2011" (08/23/2012)  . Arthritis     "all over" (08/23/2012)  . Chronic lower back pain   . Gout   . ESRD (end stage renal disease) on dialysis     "started 12/2011; [facility]; TTS" (08/23/2012   No Known Allergies Prior to Admission medications    Medication Sig Start Date End Date Taking? Authorizing Provider  acetaminophen (TYLENOL) 500 MG tablet Take 1,000 mg by mouth every 6 (six) hours as needed. For pain   Yes Historical Provider, MD  albuterol (PROVENTIL HFA;VENTOLIN HFA) 108 (90 BASE) MCG/ACT inhaler Inhale 2 puffs into the lungs every 4 (four) hours as needed for wheezing or shortness of breath.   Yes Historical Provider, MD  amLODipine (NORVASC) 10 MG tablet Take 10 mg by mouth daily.    Yes Historical Provider, MD  aspirin EC 81 MG tablet Take 81 mg by mouth daily.     Yes Historical Provider, MD  bisacodyl (DULCOLAX) 5 MG EC tablet Take 10 mg by mouth daily as needed. For constipation   Yes Historical Provider, MD  Calcium Carbonate-Vitamin D (CALCIUM 600 + D PO) Take 1 tablet by mouth daily.    Yes Historical Provider, MD  carvedilol (COREG) 25 MG tablet Take 25 mg by mouth 2 (two) times daily with a meal.   Yes Historical Provider, MD  hydrALAZINE (APRESOLINE) 50 MG tablet Take 50 mg by mouth daily.    Yes Historical Provider, MD  HYDROcodone-acetaminophen (NORCO/VICODIN) 5-325 MG per tablet Take 1-2 tablets by mouth every 6 (six) hours as needed for pain.   Yes Historical Provider, MD  indomethacin (INDOCIN) 50 MG capsule Take 50 mg by mouth 2 (two) times daily as needed (for gout pain).   Yes Historical Provider, MD  insulin glargine (LANTUS) 100 UNIT/ML injection Inject 5 Units into the skin at bedtime.    Yes Historical Provider, MD  Multiple Vitamin (MULITIVITAMIN WITH MINERALS) TABS Take 1 tablet by mouth daily.   Yes Historical Provider, MD  nitroGLYCERIN (NITROSTAT) 0.4 MG SL tablet Place 0.4 mg under the tongue every 5 (five) minutes as needed for chest pain.   Yes Historical Provider, MD  sevelamer carbonate (RENVELA) 800 MG tablet Take 2,400 mg by mouth 3 (three) times daily with meals.   Yes Historical Provider, MD   Results for orders placed or performed during the hospital encounter of 08/05/14 (from the past 48  hour(s))  CBG monitoring, ED     Status: Abnormal   Collection Time: 08/05/14  8:16 AM  Result Value Ref Range   Glucose-Capillary 196 (H) 65 - 99 mg/dL  Basic metabolic panel     Status: Abnormal   Collection Time: 08/05/14  9:22 AM  Result Value Ref Range   Sodium 135 135 - 145 mmol/L   Potassium 6.4 (HH) 3.5 - 5.1 mmol/L    Comment: REPEATED TO VERIFY NO VISIBLE HEMOLYSIS QUESTIONABLE RESULTS, RECOMMEND RECOLLECT TO VERIFY CRITICAL RESULT CALLED TO, READ BACK BY AND VERIFIED WITH: RN REID,K AT [id number] MARTINB    Chloride 97 (L) 101 - 111 mmol/L   CO2 24 22 - 32 mmol/L   Glucose, Bld 176 (H) 65 - 99 mg/dL   BUN 62 (H) 6 - 20 mg/dL   Creatinine, Ser 10.79 (H) 0.44 - 1.00 mg/dL   Calcium 9.7 8.9 - 10.3 mg/dL   GFR calc non Af Amer 3 (L) >60 mL/min   GFR calc Af Amer 4 (L) >60 mL/min    Comment: (NOTE) The eGFR has been calculated using the CKD EPI equation. This calculation has not been validated in all clinical situations. eGFR's persistently <60 mL/min signify possible Chronic Kidney Disease.    Anion gap 14 5 - 15   Physical Exam: Filed Vitals:   08/05/14 1045  BP: 90/48  Pulse: 66  Temp:   Resp:    General: NAD morbidly obese spine on HD Heart: RRR Lungs: clear Abdomen: obese soft Extremities: large, no overt HD Access: clotted left upper AVF, right fem cath at 300 reversed  Assessment/Plan: 1 Hyperkalemia K 6.4 - ran short on Thursday by 30 minutes, but del kt/v 1.1 and kinetics had been dropping; AVF subsequently clotted.  ^ K necessitated temporary catheter placement today which will be removed post HD. On 1 K bath 2. Left clotted AVF - AF had been dropping x 2; pt should have been referred for fistulagram. Fistula subsequently clotted.  Not recent interventions.  Declot has been arranged  9 am Monday am at [facility]. 3. ESRD - HD per routine use 1 kg bath 4. SHPT - continue calcitriol 5. Anemia - had ESA this week 6. HTN - BP tends on the low side,  She tells me she is not taking ANY BP meds even though they are on the [facility] med list and [facility] med list; will update list at d/c and have asked her to bring in all meds to HD on Tuesday to update their list.  7. Disp - d/c home post HD after temporary catheter removed.  Myriam Jacobson, PA-C [place] [phone] 08/05/2014, 12:30 PM   Attending Physician: Dr. Donato Heinz    I have seen and examined this patient and agree with plan as outlined above.  If we are able to successfully perform dialysis, will discharge to home and f/u with [facility] on Monday and her outpt HD on Tuesday. COLADONATO,JOSEPH A,MD 08/05/2014 1:32 PM

## 2014-08-05 NOTE — Progress Notes (Signed)
Patient admitted to 6e20 via stretcher from hemodialysis. MD placed a temporary R femoral dialysis catheter for HD to be done today d/t L AVF clotter. IV RN removed dialysis catheter. No bleeding through pressure dressing. Patient educated to not do any heavy lifting for the next couple days to prevent dislodging clot formation at femoral site. Discharge instructions reviewed. Patient understands to follow up with [facility] on Monday for AVF declotting. Bartholomew Crews, RN

## 2014-08-05 NOTE — ED Notes (Signed)
Pt cbg 196

## 2014-08-05 NOTE — Procedures (Signed)
Patient was seen on dialysis and the procedure was supervised. BFR 350 Via right femoral trialysis catheter BP is 90/48.  Patient appears to be tolerating treatment well

## 2014-08-05 NOTE — Progress Notes (Signed)
Pt discharged per wheelchair accompanied by relatives, alert and in stable condition. Discharge instructions given by AM shift RN. Discharge papers signed by pt.

## 2014-08-05 NOTE — ED Provider Notes (Signed)
CSN: [id number]     Arrival date & time 08/05/14  0804 History   First MD Initiated Contact with Patient 08/05/14 [phone]     Chief Complaint  Patient presents with  . Vascular Access Problem     (Consider location/radiation/quality/duration/timing/severity/associated sxs/prior Treatment) HPI Comments: Patient with a history of MI, DM, ESRD-HD presents with "clogged shunt" after dialysis unit unable to dialyze this morning. Shunt, per patient, has been in left upper arm for 2 years. She has had clots before that have been treated successfully in the vascular physician office, last one earlier this year. She last dialyzed 2 days ago without complication. She denies symptoms - no pain, SOB, fever.   The history is provided by the patient. No language interpreter was used.    Past Medical History  Diagnosis Date  . Abdominal abscess 12-17-10    abdominal abscesses x2 ? one at this time  . Staph aureus infection November 2012   . Swelling of both ankles 12-17-10  . Anemia   . Coronary artery disease     Dr Kadakia-cardiologist 2-3 x year  . CHF (congestive heart failure)   . Dysrhythmia     irregular, "skips beats"  . Hypertension     sees Dr. Willey Blade  . Neuromuscular disorder     diabetic neuropathy  . Blind right eye     fell and crushed socket and eye fell out, was replaced at [facility]  . Heart murmur   . Myocardial infarction 1970's or 1980's  . Exertional shortness of breath   . Inhalation injury     "worked at [employer]; can't inhale polyurethane or paint, etc" (08/23/2012)  . OSA on CPAP   . Type II diabetes mellitus     "over 10 years now" (08/23/2012)  . History of blood transfusion     "lots before starting dialysis" (08/23/2012)  . H/O hiatal hernia   . Headache(784.0)     "weekly sometimes; since I fell and hit my head in 07/2011 08/23/2012)  . Eye drainage     "lots; since fall 07/2011" (08/23/2012)  . Arthritis     "all over" (08/23/2012)  . Chronic lower back pain    . Gout   . ESRD (end stage renal disease) on dialysis     "started 12/2011; [place]; TTS" (08/23/2012   Past Surgical History  Procedure Laterality Date  . Ventral hernia repair  2012-2013    component separation, repair with biologic; "had 3 surgeries to fix it" (08/23/2012)  . Cataract extraction w/ intraocular lens implant Bilateral 1990's  . Laparotomy  12/18/2010    Procedure: EXPLORATORY LAPAROTOMY;  Surgeon: Rolm Bookbinder, MD;  Location: [facility];  Service: General;  Laterality: N/A;  Abdominal Seroma Evacuation  . Back surgery    . Lumbar disc surgery  1970'-80's    X 3  . Wound debridement  03/20/2011    Procedure: DEBRIDEMENT ABDOMINAL WOUND;  Surgeon: Rolm Bookbinder, MD;  Location: [place];  Service: General;  Laterality: N/A;  debridement abdominal wall, placement of wound vac  . Av fistula placement  09/30/2011    Procedure: ARTERIOVENOUS (AV) FISTULA CREATION;  Surgeon: Conrad Yankton, MD;  Location: [place];  Service: Vascular;  Laterality: Left;  BRACHIAL-CEPHALIC  . Bascilic vein transposition Left 11/10/2011    Left arm   . Insertion of dialysis catheter  01/05/2012    Procedure: INSERTION OF DIALYSIS CATHETER;  Surgeon: Conrad Vandalia, MD;  Location: [place];  Service:  Vascular;  Laterality: N/A;  Right Internal Jugular Placement  . Bascilic vein transposition  01/28/2012    Procedure: BASCILIC VEIN TRANSPOSITION;  Surgeon: Conrad Pulaski, MD;  Location: [place];  Service: Vascular;  Laterality: Left;  Second Stage   . Appendectomy    . Hernia repair    . Joint replacement    . Total knee arthroplasty Left 1980's  . Vaginal hysterectomy  1970's  . Dilation and curettage of uterus  1960's  . Intraocular prostheses insertion Right 2013    "fell; knocked my eye outl" (08/23/2012)  . Eye surgery    . Cardiac catheterization  1980's  . Left heart catheterization with coronary angiogram N/A 08/25/2012    Procedure: LEFT HEART CATHETERIZATION WITH CORONARY ANGIOGRAM;  Surgeon: Birdie Riddle, MD;  Location: [place] [facility];  Service: Cardiovascular;  Laterality: N/A;   Family History  Problem Relation Age of Onset  . Hypertension Mother   . Cancer Brother     spine  . Cancer Sister     brain  . Hypertension Maternal Grandmother   . Anesthesia problems Neg Hx   . Hypertension Father    History  Substance Use Topics  . Smoking status: Former Smoker -- 0.25 packs/day for 16 years    Types: Cigarettes    Quit date: 12/16/1980  . Smokeless tobacco: Never Used  . Alcohol Use: No   OB History    No data available     Review of Systems  Constitutional: Negative for fever and chills.  Respiratory: Negative.  Negative for shortness of breath.   Cardiovascular: Negative.   Musculoskeletal:       No complaint of pain or swelling.  Skin: Negative.  Negative for color change.  Neurological: Negative.       Allergies  Review of patient's allergies indicates no known allergies.  Home Medications   Prior to Admission medications   Medication Sig Start Date End Date Taking? Authorizing Provider  acetaminophen (TYLENOL) 500 MG tablet Take 500 mg by mouth every 6 (six) hours as needed. For pain    Historical Provider, MD  amLODipine (NORVASC) 10 MG tablet Take 10 mg by mouth daily.     Historical Provider, MD  aspirin EC 81 MG tablet Take 81 mg by mouth daily.      Historical Provider, MD  bisacodyl (DULCOLAX) 5 MG EC tablet Take 5 mg by mouth daily as needed. For constipation    Historical Provider, MD  Calcium Carbonate-Vitamin D (CALCIUM 600 + D PO) Take 1 tablet by mouth daily.     Historical Provider, MD  carvedilol (COREG) 25 MG tablet Take 25 mg by mouth 2 (two) times daily with a meal.    Historical Provider, MD  ferrous sulfate 325 (65 FE) MG tablet Take 325 mg by mouth daily with breakfast.      Historical Provider, MD  hydrALAZINE (APRESOLINE) 50 MG tablet Take 50 mg by mouth 3 (three) times daily.    Historical Provider, MD  HYDROcodone-acetaminophen  (NORCO/VICODIN) 5-325 MG per tablet Take 1-2 tablets by mouth every 6 (six) hours as needed for pain.    Historical Provider, MD  indomethacin (INDOCIN) 50 MG capsule Take 50 mg by mouth 2 (two) times daily as needed (for gout pain).    Historical Provider, MD  insulin glargine (LANTUS) 100 UNIT/ML injection Inject 10 Units into the skin at bedtime.    Historical Provider, MD  isosorbide mononitrate (IMDUR) 60 MG 24 hr tablet  Take 60 mg by mouth daily.     Historical Provider, MD  metoprolol succinate (TOPROL-XL) 50 MG 24 hr tablet Take 50 mg by mouth daily.  10/13/11   Historical Provider, MD  Multiple Vitamin (MULITIVITAMIN WITH MINERALS) TABS Take 1 tablet by mouth daily.    Historical Provider, MD  nitroGLYCERIN (NITROSTAT) 0.4 MG SL tablet Place 0.4 mg under the tongue every 5 (five) minutes as needed for chest pain.    Historical Provider, MD  rosuvastatin (CRESTOR) 20 MG tablet Take 20 mg by mouth daily.    Historical Provider, MD  sevelamer carbonate (RENVELA) 800 MG tablet Take 2,400 mg by mouth 3 (three) times daily with meals.    Historical Provider, MD   BP 93/48 mmHg  Pulse 71  Temp(Src) 98 F (36.7 C) (Oral)  Resp 18  Ht 5\' 5"  (1.651 m)  Wt 284 lb 12.8 oz (129.184 kg)  BMI 47.39 kg/m2  SpO2 98% Physical Exam  Constitutional: She is oriented to person, place, and time. She appears well-developed and well-nourished.  Neck: Normal range of motion.  Pulmonary/Chest: Effort normal.  Musculoskeletal:  Left upper extremity has shunt to medial aspect. Thrill is largely absent, with minimal pulsation to distal portion of shunt structure. Non-tender. No redness or swelling.   Neurological: She is alert and oriented to person, place, and time.  Skin: Skin is warm and dry.    ED Course  Procedures (including critical care time) Labs Review Labs Reviewed  CBG MONITORING, ED - Abnormal; Notable for the following:    Glucose-Capillary 196 (*)    All other components within normal  limits    Imaging Review No results found.   EKG Interpretation None      MDM   Final diagnoses:  None    1. Complication of dialysis graft  The patient is comfortable. No evidence of infection. Minimal thrill and unable to dialyze - likely clotted graft needing thrombolysis. Discussed with Dr. Marval Regal who will see her in the emergency department.     Charlann Lange, PA-C 08/05/14 [place], MD 08/06/14 [phone]

## 2014-08-05 NOTE — Procedures (Signed)
Hemodialysis Catheter Insertion Procedure Note [name] [id number] [birth year]  Procedure: Insertion of Hemodialysis Catheter Indications: Dialysis Access   Procedure Details Consent: Risks of procedure as well as the alternatives and risks of each were explained to the (patient/caregiver).  Consent for procedure obtained. Time Out: Verified patient identification, verified procedure, site/side was marked, verified correct patient position, special equipment/implants available, medications/allergies/relevent history reviewed, required imaging and test results available.  Performed  Maximum sterile technique was used including antiseptics, gloves, gown, hand hygiene, mask and sheet. Skin prep: Chlorhexidine; local anesthetic administered Triple lumen hemodialysis catheter was inserted into right femoral vein due to patient being a dialysis patient using the Seldinger technique.  Evaluation Complications: No apparent complications Patient did tolerate procedure well.    [place] A 08/05/2014

## 2014-08-05 NOTE — Discharge Summary (Signed)
Physician Discharge Summary  Patient ID: [name] MRN: [id number] DOB/AGE: 27-Jun-1945 69 y.o.  Admit date: 08/05/2014 Discharge date: 08/05/2014  Admission Diagnoses: Hyperkalemia, malfunction of vascular access  Discharge Diagnoses: Hyperkalemia Active Problems:   Hyperkalemia   Discharged Condition: good  Hospital Course: Pt was admitted to [facility] and right femoral HD catheter was placed under US guidance.  20cm trialysis catheter was flushed and then used for urgent hemodialysis.  She tolerated the procedure well.  HD catheter was discontinued with good hemostasis and patient was stable for discharge with follow up to [facility] for thrombectomy and/or placement of a tunneled HD catheter on 08/07/14  Consults: None  Significant Diagnostic Studies: labs: Potassium 6.4 improved to 3.1.  Treatments: procedures: HD catheter and inpatient hemodialysis  Blood pressure 101/22, pulse 75, temperature 98 F (36.7 C), temperature source Oral, resp. rate 22, height 5\' 5"  (1.651 m), weight 129.184 kg (284 lb 12.8 oz), SpO2 95 %.  Disposition: 01-Home or Self Care  Discharge Instructions    Activity as tolerated    Complete by:  As directed      Avoid straining    Complete by:  As directed      Bring all medications to your doctor's appointment    Complete by:  As directed      CALL 911 for chest discomfort not relieved by NTG or lasting longer than 20 minutes    Complete by:  As directed      Call doctor for chest discomfort that is more frequent or severe    Complete by:  As directed      Call doctor for fainting or near blackouts    Complete by:  As directed      Call doctor for more than 3-4 kg weight gain between hemodialysis treatments    Complete by:  As directed      Call doctor for shortness of breath, with or without a dry hacking cough    Complete by:  As directed      Call doctor for swellling in the hands, feet or stomach not improved after hemodialysis    Complete by:  As  directed      Call doctor if you have to sleep on extra pillows at night in order to breathe    Complete by:  As directed      Diet renal with fluid restriction    Complete by:  As directed      Discharge Instructions    Complete by:  As directed   STOP Potassium supplements Go to [facility] Monday at 9 am  For declot of your fistula; Nothing to eat or drink after midnight Sunday.     Do not skip any hemodialysis appointments unless directed by your doctor    Complete by:  As directed      Record daily weight on same scale at same time of day after urinating and before breakfast    Complete by:  As directed      STOP ANY ACTIVITY THAT CAUSES CHEST PAIN, SHORTNESS OF BREATH, DIZZINESS, SWEATING OR EXCESSIVE WEAKNESS    Complete by:  As directed             Medication List    STOP taking these medications        amLODipine 10 MG tablet  Commonly known as:  NORVASC     carvedilol 25 MG tablet  Commonly known as:  COREG     hydrALAZINE 50  MG tablet  Commonly known as:  APRESOLINE     indomethacin 50 MG capsule  Commonly known as:  INDOCIN      TAKE these medications        acetaminophen 500 MG tablet  Commonly known as:  TYLENOL  Take 1,000 mg by mouth every 6 (six) hours as needed. For pain     albuterol 108 (90 BASE) MCG/ACT inhaler  Commonly known as:  PROVENTIL HFA;VENTOLIN HFA  Inhale 2 puffs into the lungs every 4 (four) hours as needed for wheezing or shortness of breath.     aspirin EC 81 MG tablet  Take 81 mg by mouth daily.     bisacodyl 5 MG EC tablet  Commonly known as:  DULCOLAX  Take 10 mg by mouth daily as needed. For constipation     CALCIUM 600 + D PO  Take 1 tablet by mouth daily.     HYDROcodone-acetaminophen 5-325 MG per tablet  Commonly known as:  NORCO/VICODIN  Take 1-2 tablets by mouth every 6 (six) hours as needed for pain.     insulin glargine 100 UNIT/ML injection  Commonly known as:  LANTUS  Inject 5 Units into the  skin at bedtime.     multivitamin with minerals Tabs tablet  Take 1 tablet by mouth daily.     nitroGLYCERIN 0.4 MG SL tablet  Commonly known as:  NITROSTAT  Place 0.4 mg under the tongue every 5 (five) minutes as needed for chest pain.     sevelamer carbonate 800 MG tablet  Commonly known as:  RENVELA  Take 2,400 mg by mouth 3 (three) times daily with meals.           Follow-up Information    Follow up with [facility]. Go on 08/07/2014.   Why:  for declot of fistula at 9 am.      Signed: [place] Trela A 08/05/2014, 7:03 PM

## 2014-08-06 DIAGNOSIS — J40 Bronchitis, not specified as acute or chronic: Secondary | ICD-10-CM | POA: Diagnosis not present

## 2014-08-06 DIAGNOSIS — D539 Nutritional anemia, unspecified: Secondary | ICD-10-CM | POA: Diagnosis not present

## 2014-08-06 DIAGNOSIS — J151 Pneumonia due to Pseudomonas: Secondary | ICD-10-CM | POA: Diagnosis not present

## 2014-08-06 DIAGNOSIS — M109 Gout, unspecified: Secondary | ICD-10-CM | POA: Diagnosis not present

## 2014-08-07 DIAGNOSIS — J40 Bronchitis, not specified as acute or chronic: Secondary | ICD-10-CM | POA: Diagnosis not present

## 2014-08-07 DIAGNOSIS — Z992 Dependence on renal dialysis: Secondary | ICD-10-CM | POA: Diagnosis not present

## 2014-08-07 DIAGNOSIS — I871 Compression of vein: Secondary | ICD-10-CM | POA: Diagnosis not present

## 2014-08-07 DIAGNOSIS — T82858D Stenosis of vascular prosthetic devices, implants and grafts, subsequent encounter: Secondary | ICD-10-CM | POA: Diagnosis not present

## 2014-08-07 DIAGNOSIS — N186 End stage renal disease: Secondary | ICD-10-CM | POA: Diagnosis not present

## 2014-08-07 DIAGNOSIS — M109 Gout, unspecified: Secondary | ICD-10-CM | POA: Diagnosis not present

## 2014-08-07 DIAGNOSIS — J151 Pneumonia due to Pseudomonas: Secondary | ICD-10-CM | POA: Diagnosis not present

## 2014-08-07 DIAGNOSIS — D539 Nutritional anemia, unspecified: Secondary | ICD-10-CM | POA: Diagnosis not present

## 2014-08-08 DIAGNOSIS — J151 Pneumonia due to Pseudomonas: Secondary | ICD-10-CM | POA: Diagnosis not present

## 2014-08-08 DIAGNOSIS — J40 Bronchitis, not specified as acute or chronic: Secondary | ICD-10-CM | POA: Diagnosis not present

## 2014-08-08 DIAGNOSIS — D539 Nutritional anemia, unspecified: Secondary | ICD-10-CM | POA: Diagnosis not present

## 2014-08-08 DIAGNOSIS — N186 End stage renal disease: Secondary | ICD-10-CM | POA: Diagnosis not present

## 2014-08-08 DIAGNOSIS — M109 Gout, unspecified: Secondary | ICD-10-CM | POA: Diagnosis not present

## 2014-08-09 DIAGNOSIS — J151 Pneumonia due to Pseudomonas: Secondary | ICD-10-CM | POA: Diagnosis not present

## 2014-08-09 DIAGNOSIS — M109 Gout, unspecified: Secondary | ICD-10-CM | POA: Diagnosis not present

## 2014-08-09 DIAGNOSIS — D539 Nutritional anemia, unspecified: Secondary | ICD-10-CM | POA: Diagnosis not present

## 2014-08-09 DIAGNOSIS — J40 Bronchitis, not specified as acute or chronic: Secondary | ICD-10-CM | POA: Diagnosis not present

## 2014-08-10 DIAGNOSIS — D539 Nutritional anemia, unspecified: Secondary | ICD-10-CM | POA: Diagnosis not present

## 2014-08-10 DIAGNOSIS — J40 Bronchitis, not specified as acute or chronic: Secondary | ICD-10-CM | POA: Diagnosis not present

## 2014-08-10 DIAGNOSIS — J151 Pneumonia due to Pseudomonas: Secondary | ICD-10-CM | POA: Diagnosis not present

## 2014-08-10 DIAGNOSIS — M109 Gout, unspecified: Secondary | ICD-10-CM | POA: Diagnosis not present

## 2014-08-10 DIAGNOSIS — N186 End stage renal disease: Secondary | ICD-10-CM | POA: Diagnosis not present

## 2014-08-11 DIAGNOSIS — J40 Bronchitis, not specified as acute or chronic: Secondary | ICD-10-CM | POA: Diagnosis not present

## 2014-08-11 DIAGNOSIS — D539 Nutritional anemia, unspecified: Secondary | ICD-10-CM | POA: Diagnosis not present

## 2014-08-11 DIAGNOSIS — J151 Pneumonia due to Pseudomonas: Secondary | ICD-10-CM | POA: Diagnosis not present

## 2014-08-11 DIAGNOSIS — M109 Gout, unspecified: Secondary | ICD-10-CM | POA: Diagnosis not present

## 2014-08-12 DIAGNOSIS — D539 Nutritional anemia, unspecified: Secondary | ICD-10-CM | POA: Diagnosis not present

## 2014-08-12 DIAGNOSIS — J151 Pneumonia due to Pseudomonas: Secondary | ICD-10-CM | POA: Diagnosis not present

## 2014-08-12 DIAGNOSIS — M109 Gout, unspecified: Secondary | ICD-10-CM | POA: Diagnosis not present

## 2014-08-12 DIAGNOSIS — N186 End stage renal disease: Secondary | ICD-10-CM | POA: Diagnosis not present

## 2014-08-12 DIAGNOSIS — J40 Bronchitis, not specified as acute or chronic: Secondary | ICD-10-CM | POA: Diagnosis not present

## 2014-08-13 DIAGNOSIS — J40 Bronchitis, not specified as acute or chronic: Secondary | ICD-10-CM | POA: Diagnosis not present

## 2014-08-13 DIAGNOSIS — D539 Nutritional anemia, unspecified: Secondary | ICD-10-CM | POA: Diagnosis not present

## 2014-08-13 DIAGNOSIS — J151 Pneumonia due to Pseudomonas: Secondary | ICD-10-CM | POA: Diagnosis not present

## 2014-08-13 DIAGNOSIS — M109 Gout, unspecified: Secondary | ICD-10-CM | POA: Diagnosis not present

## 2014-08-14 DIAGNOSIS — J40 Bronchitis, not specified as acute or chronic: Secondary | ICD-10-CM | POA: Diagnosis not present

## 2014-08-14 DIAGNOSIS — M109 Gout, unspecified: Secondary | ICD-10-CM | POA: Diagnosis not present

## 2014-08-14 DIAGNOSIS — D539 Nutritional anemia, unspecified: Secondary | ICD-10-CM | POA: Diagnosis not present

## 2014-08-14 DIAGNOSIS — J151 Pneumonia due to Pseudomonas: Secondary | ICD-10-CM | POA: Diagnosis not present

## 2014-08-15 DIAGNOSIS — I2510 Atherosclerotic heart disease of native coronary artery without angina pectoris: Secondary | ICD-10-CM | POA: Diagnosis not present

## 2014-08-15 DIAGNOSIS — E1140 Type 2 diabetes mellitus with diabetic neuropathy, unspecified: Secondary | ICD-10-CM | POA: Diagnosis not present

## 2014-08-15 DIAGNOSIS — I5022 Chronic systolic (congestive) heart failure: Secondary | ICD-10-CM | POA: Diagnosis not present

## 2014-08-15 DIAGNOSIS — R9431 Abnormal electrocardiogram [ECG] [EKG]: Secondary | ICD-10-CM | POA: Diagnosis not present

## 2014-08-15 DIAGNOSIS — N186 End stage renal disease: Secondary | ICD-10-CM | POA: Diagnosis not present

## 2014-08-17 DIAGNOSIS — N186 End stage renal disease: Secondary | ICD-10-CM | POA: Diagnosis not present

## 2014-08-19 DIAGNOSIS — N186 End stage renal disease: Secondary | ICD-10-CM | POA: Diagnosis not present

## 2014-08-20 DIAGNOSIS — E1129 Type 2 diabetes mellitus with other diabetic kidney complication: Secondary | ICD-10-CM | POA: Diagnosis not present

## 2014-08-20 DIAGNOSIS — N186 End stage renal disease: Secondary | ICD-10-CM | POA: Diagnosis not present

## 2014-08-20 DIAGNOSIS — Z992 Dependence on renal dialysis: Secondary | ICD-10-CM | POA: Diagnosis not present

## 2014-08-21 DIAGNOSIS — M109 Gout, unspecified: Secondary | ICD-10-CM | POA: Diagnosis not present

## 2014-08-21 DIAGNOSIS — J40 Bronchitis, not specified as acute or chronic: Secondary | ICD-10-CM | POA: Diagnosis not present

## 2014-08-21 DIAGNOSIS — D539 Nutritional anemia, unspecified: Secondary | ICD-10-CM | POA: Diagnosis not present

## 2014-08-21 DIAGNOSIS — J151 Pneumonia due to Pseudomonas: Secondary | ICD-10-CM | POA: Diagnosis not present

## 2014-08-22 DIAGNOSIS — J151 Pneumonia due to Pseudomonas: Secondary | ICD-10-CM | POA: Diagnosis not present

## 2014-08-22 DIAGNOSIS — D539 Nutritional anemia, unspecified: Secondary | ICD-10-CM | POA: Diagnosis not present

## 2014-08-22 DIAGNOSIS — N186 End stage renal disease: Secondary | ICD-10-CM | POA: Diagnosis not present

## 2014-08-22 DIAGNOSIS — M109 Gout, unspecified: Secondary | ICD-10-CM | POA: Diagnosis not present

## 2014-08-22 DIAGNOSIS — J40 Bronchitis, not specified as acute or chronic: Secondary | ICD-10-CM | POA: Diagnosis not present

## 2014-08-23 DIAGNOSIS — M109 Gout, unspecified: Secondary | ICD-10-CM | POA: Diagnosis not present

## 2014-08-23 DIAGNOSIS — D539 Nutritional anemia, unspecified: Secondary | ICD-10-CM | POA: Diagnosis not present

## 2014-08-23 DIAGNOSIS — J151 Pneumonia due to Pseudomonas: Secondary | ICD-10-CM | POA: Diagnosis not present

## 2014-08-23 DIAGNOSIS — J40 Bronchitis, not specified as acute or chronic: Secondary | ICD-10-CM | POA: Diagnosis not present

## 2014-08-24 DIAGNOSIS — J40 Bronchitis, not specified as acute or chronic: Secondary | ICD-10-CM | POA: Diagnosis not present

## 2014-08-24 DIAGNOSIS — N186 End stage renal disease: Secondary | ICD-10-CM | POA: Diagnosis not present

## 2014-08-24 DIAGNOSIS — J151 Pneumonia due to Pseudomonas: Secondary | ICD-10-CM | POA: Diagnosis not present

## 2014-08-24 DIAGNOSIS — M109 Gout, unspecified: Secondary | ICD-10-CM | POA: Diagnosis not present

## 2014-08-24 DIAGNOSIS — D539 Nutritional anemia, unspecified: Secondary | ICD-10-CM | POA: Diagnosis not present

## 2014-08-25 DIAGNOSIS — M109 Gout, unspecified: Secondary | ICD-10-CM | POA: Diagnosis not present

## 2014-08-25 DIAGNOSIS — E785 Hyperlipidemia, unspecified: Secondary | ICD-10-CM | POA: Diagnosis not present

## 2014-08-25 DIAGNOSIS — I10 Essential (primary) hypertension: Secondary | ICD-10-CM | POA: Diagnosis not present

## 2014-08-25 DIAGNOSIS — N186 End stage renal disease: Secondary | ICD-10-CM | POA: Diagnosis not present

## 2014-08-25 DIAGNOSIS — J151 Pneumonia due to Pseudomonas: Secondary | ICD-10-CM | POA: Diagnosis not present

## 2014-08-25 DIAGNOSIS — J40 Bronchitis, not specified as acute or chronic: Secondary | ICD-10-CM | POA: Diagnosis not present

## 2014-08-25 DIAGNOSIS — E1140 Type 2 diabetes mellitus with diabetic neuropathy, unspecified: Secondary | ICD-10-CM | POA: Diagnosis not present

## 2014-08-25 DIAGNOSIS — D539 Nutritional anemia, unspecified: Secondary | ICD-10-CM | POA: Diagnosis not present

## 2014-08-25 DIAGNOSIS — E119 Type 2 diabetes mellitus without complications: Secondary | ICD-10-CM | POA: Diagnosis not present

## 2014-08-25 DIAGNOSIS — I129 Hypertensive chronic kidney disease with stage 1 through stage 4 chronic kidney disease, or unspecified chronic kidney disease: Secondary | ICD-10-CM | POA: Diagnosis not present

## 2014-08-25 DIAGNOSIS — Z6841 Body Mass Index (BMI) 40.0 and over, adult: Secondary | ICD-10-CM | POA: Diagnosis not present

## 2014-08-26 DIAGNOSIS — J151 Pneumonia due to Pseudomonas: Secondary | ICD-10-CM | POA: Diagnosis not present

## 2014-08-26 DIAGNOSIS — N186 End stage renal disease: Secondary | ICD-10-CM | POA: Diagnosis not present

## 2014-08-26 DIAGNOSIS — D539 Nutritional anemia, unspecified: Secondary | ICD-10-CM | POA: Diagnosis not present

## 2014-08-26 DIAGNOSIS — J40 Bronchitis, not specified as acute or chronic: Secondary | ICD-10-CM | POA: Diagnosis not present

## 2014-08-26 DIAGNOSIS — M109 Gout, unspecified: Secondary | ICD-10-CM | POA: Diagnosis not present

## 2014-08-27 DIAGNOSIS — M109 Gout, unspecified: Secondary | ICD-10-CM | POA: Diagnosis not present

## 2014-08-27 DIAGNOSIS — J151 Pneumonia due to Pseudomonas: Secondary | ICD-10-CM | POA: Diagnosis not present

## 2014-08-27 DIAGNOSIS — D539 Nutritional anemia, unspecified: Secondary | ICD-10-CM | POA: Diagnosis not present

## 2014-08-27 DIAGNOSIS — J40 Bronchitis, not specified as acute or chronic: Secondary | ICD-10-CM | POA: Diagnosis not present

## 2014-08-28 DIAGNOSIS — J151 Pneumonia due to Pseudomonas: Secondary | ICD-10-CM | POA: Diagnosis not present

## 2014-08-28 DIAGNOSIS — M109 Gout, unspecified: Secondary | ICD-10-CM | POA: Diagnosis not present

## 2014-08-28 DIAGNOSIS — J40 Bronchitis, not specified as acute or chronic: Secondary | ICD-10-CM | POA: Diagnosis not present

## 2014-08-28 DIAGNOSIS — D539 Nutritional anemia, unspecified: Secondary | ICD-10-CM | POA: Diagnosis not present

## 2014-08-29 DIAGNOSIS — N186 End stage renal disease: Secondary | ICD-10-CM | POA: Diagnosis not present

## 2014-08-29 DIAGNOSIS — D539 Nutritional anemia, unspecified: Secondary | ICD-10-CM | POA: Diagnosis not present

## 2014-08-29 DIAGNOSIS — J151 Pneumonia due to Pseudomonas: Secondary | ICD-10-CM | POA: Diagnosis not present

## 2014-08-29 DIAGNOSIS — M109 Gout, unspecified: Secondary | ICD-10-CM | POA: Diagnosis not present

## 2014-08-29 DIAGNOSIS — J40 Bronchitis, not specified as acute or chronic: Secondary | ICD-10-CM | POA: Diagnosis not present

## 2014-08-30 DIAGNOSIS — D539 Nutritional anemia, unspecified: Secondary | ICD-10-CM | POA: Diagnosis not present

## 2014-08-30 DIAGNOSIS — M109 Gout, unspecified: Secondary | ICD-10-CM | POA: Diagnosis not present

## 2014-08-30 DIAGNOSIS — J151 Pneumonia due to Pseudomonas: Secondary | ICD-10-CM | POA: Diagnosis not present

## 2014-08-30 DIAGNOSIS — J40 Bronchitis, not specified as acute or chronic: Secondary | ICD-10-CM | POA: Diagnosis not present

## 2014-08-31 DIAGNOSIS — M109 Gout, unspecified: Secondary | ICD-10-CM | POA: Diagnosis not present

## 2014-08-31 DIAGNOSIS — J40 Bronchitis, not specified as acute or chronic: Secondary | ICD-10-CM | POA: Diagnosis not present

## 2014-08-31 DIAGNOSIS — J151 Pneumonia due to Pseudomonas: Secondary | ICD-10-CM | POA: Diagnosis not present

## 2014-08-31 DIAGNOSIS — D539 Nutritional anemia, unspecified: Secondary | ICD-10-CM | POA: Diagnosis not present

## 2014-08-31 DIAGNOSIS — N186 End stage renal disease: Secondary | ICD-10-CM | POA: Diagnosis not present

## 2014-09-01 DIAGNOSIS — M109 Gout, unspecified: Secondary | ICD-10-CM | POA: Diagnosis not present

## 2014-09-01 DIAGNOSIS — J40 Bronchitis, not specified as acute or chronic: Secondary | ICD-10-CM | POA: Diagnosis not present

## 2014-09-01 DIAGNOSIS — J151 Pneumonia due to Pseudomonas: Secondary | ICD-10-CM | POA: Diagnosis not present

## 2014-09-01 DIAGNOSIS — N186 End stage renal disease: Secondary | ICD-10-CM | POA: Diagnosis not present

## 2014-09-01 DIAGNOSIS — D539 Nutritional anemia, unspecified: Secondary | ICD-10-CM | POA: Diagnosis not present

## 2014-09-02 DIAGNOSIS — M109 Gout, unspecified: Secondary | ICD-10-CM | POA: Diagnosis not present

## 2014-09-02 DIAGNOSIS — D539 Nutritional anemia, unspecified: Secondary | ICD-10-CM | POA: Diagnosis not present

## 2014-09-02 DIAGNOSIS — J40 Bronchitis, not specified as acute or chronic: Secondary | ICD-10-CM | POA: Diagnosis not present

## 2014-09-02 DIAGNOSIS — J151 Pneumonia due to Pseudomonas: Secondary | ICD-10-CM | POA: Diagnosis not present

## 2014-09-03 DIAGNOSIS — J40 Bronchitis, not specified as acute or chronic: Secondary | ICD-10-CM | POA: Diagnosis not present

## 2014-09-03 DIAGNOSIS — J151 Pneumonia due to Pseudomonas: Secondary | ICD-10-CM | POA: Diagnosis not present

## 2014-09-03 DIAGNOSIS — D539 Nutritional anemia, unspecified: Secondary | ICD-10-CM | POA: Diagnosis not present

## 2014-09-03 DIAGNOSIS — M109 Gout, unspecified: Secondary | ICD-10-CM | POA: Diagnosis not present

## 2014-09-04 DIAGNOSIS — J151 Pneumonia due to Pseudomonas: Secondary | ICD-10-CM | POA: Diagnosis not present

## 2014-09-04 DIAGNOSIS — M109 Gout, unspecified: Secondary | ICD-10-CM | POA: Diagnosis not present

## 2014-09-04 DIAGNOSIS — J40 Bronchitis, not specified as acute or chronic: Secondary | ICD-10-CM | POA: Diagnosis not present

## 2014-09-04 DIAGNOSIS — D539 Nutritional anemia, unspecified: Secondary | ICD-10-CM | POA: Diagnosis not present

## 2014-09-05 DIAGNOSIS — M109 Gout, unspecified: Secondary | ICD-10-CM | POA: Diagnosis not present

## 2014-09-05 DIAGNOSIS — J40 Bronchitis, not specified as acute or chronic: Secondary | ICD-10-CM | POA: Diagnosis not present

## 2014-09-05 DIAGNOSIS — J151 Pneumonia due to Pseudomonas: Secondary | ICD-10-CM | POA: Diagnosis not present

## 2014-09-05 DIAGNOSIS — D539 Nutritional anemia, unspecified: Secondary | ICD-10-CM | POA: Diagnosis not present

## 2014-09-05 DIAGNOSIS — N186 End stage renal disease: Secondary | ICD-10-CM | POA: Diagnosis not present

## 2014-09-06 DIAGNOSIS — J40 Bronchitis, not specified as acute or chronic: Secondary | ICD-10-CM | POA: Diagnosis not present

## 2014-09-06 DIAGNOSIS — D539 Nutritional anemia, unspecified: Secondary | ICD-10-CM | POA: Diagnosis not present

## 2014-09-06 DIAGNOSIS — J151 Pneumonia due to Pseudomonas: Secondary | ICD-10-CM | POA: Diagnosis not present

## 2014-09-06 DIAGNOSIS — M109 Gout, unspecified: Secondary | ICD-10-CM | POA: Diagnosis not present

## 2014-09-07 DIAGNOSIS — J151 Pneumonia due to Pseudomonas: Secondary | ICD-10-CM | POA: Diagnosis not present

## 2014-09-07 DIAGNOSIS — D539 Nutritional anemia, unspecified: Secondary | ICD-10-CM | POA: Diagnosis not present

## 2014-09-07 DIAGNOSIS — N186 End stage renal disease: Secondary | ICD-10-CM | POA: Diagnosis not present

## 2014-09-07 DIAGNOSIS — J40 Bronchitis, not specified as acute or chronic: Secondary | ICD-10-CM | POA: Diagnosis not present

## 2014-09-07 DIAGNOSIS — M109 Gout, unspecified: Secondary | ICD-10-CM | POA: Diagnosis not present

## 2014-09-08 DIAGNOSIS — D539 Nutritional anemia, unspecified: Secondary | ICD-10-CM | POA: Diagnosis not present

## 2014-09-08 DIAGNOSIS — J151 Pneumonia due to Pseudomonas: Secondary | ICD-10-CM | POA: Diagnosis not present

## 2014-09-08 DIAGNOSIS — M109 Gout, unspecified: Secondary | ICD-10-CM | POA: Diagnosis not present

## 2014-09-08 DIAGNOSIS — J40 Bronchitis, not specified as acute or chronic: Secondary | ICD-10-CM | POA: Diagnosis not present

## 2014-09-09 DIAGNOSIS — J40 Bronchitis, not specified as acute or chronic: Secondary | ICD-10-CM | POA: Diagnosis not present

## 2014-09-09 DIAGNOSIS — J151 Pneumonia due to Pseudomonas: Secondary | ICD-10-CM | POA: Diagnosis not present

## 2014-09-09 DIAGNOSIS — D539 Nutritional anemia, unspecified: Secondary | ICD-10-CM | POA: Diagnosis not present

## 2014-09-09 DIAGNOSIS — M109 Gout, unspecified: Secondary | ICD-10-CM | POA: Diagnosis not present

## 2014-09-09 DIAGNOSIS — N186 End stage renal disease: Secondary | ICD-10-CM | POA: Diagnosis not present

## 2014-09-10 DIAGNOSIS — J151 Pneumonia due to Pseudomonas: Secondary | ICD-10-CM | POA: Diagnosis not present

## 2014-09-10 DIAGNOSIS — M109 Gout, unspecified: Secondary | ICD-10-CM | POA: Diagnosis not present

## 2014-09-10 DIAGNOSIS — D539 Nutritional anemia, unspecified: Secondary | ICD-10-CM | POA: Diagnosis not present

## 2014-09-10 DIAGNOSIS — J40 Bronchitis, not specified as acute or chronic: Secondary | ICD-10-CM | POA: Diagnosis not present

## 2014-09-11 DIAGNOSIS — M109 Gout, unspecified: Secondary | ICD-10-CM | POA: Diagnosis not present

## 2014-09-11 DIAGNOSIS — J40 Bronchitis, not specified as acute or chronic: Secondary | ICD-10-CM | POA: Diagnosis not present

## 2014-09-11 DIAGNOSIS — D539 Nutritional anemia, unspecified: Secondary | ICD-10-CM | POA: Diagnosis not present

## 2014-09-11 DIAGNOSIS — J151 Pneumonia due to Pseudomonas: Secondary | ICD-10-CM | POA: Diagnosis not present

## 2014-09-12 DIAGNOSIS — J151 Pneumonia due to Pseudomonas: Secondary | ICD-10-CM | POA: Diagnosis not present

## 2014-09-12 DIAGNOSIS — M109 Gout, unspecified: Secondary | ICD-10-CM | POA: Diagnosis not present

## 2014-09-12 DIAGNOSIS — J40 Bronchitis, not specified as acute or chronic: Secondary | ICD-10-CM | POA: Diagnosis not present

## 2014-09-12 DIAGNOSIS — N186 End stage renal disease: Secondary | ICD-10-CM | POA: Diagnosis not present

## 2014-09-12 DIAGNOSIS — D539 Nutritional anemia, unspecified: Secondary | ICD-10-CM | POA: Diagnosis not present

## 2014-09-13 DIAGNOSIS — D539 Nutritional anemia, unspecified: Secondary | ICD-10-CM | POA: Diagnosis not present

## 2014-09-13 DIAGNOSIS — J151 Pneumonia due to Pseudomonas: Secondary | ICD-10-CM | POA: Diagnosis not present

## 2014-09-13 DIAGNOSIS — M109 Gout, unspecified: Secondary | ICD-10-CM | POA: Diagnosis not present

## 2014-09-13 DIAGNOSIS — J40 Bronchitis, not specified as acute or chronic: Secondary | ICD-10-CM | POA: Diagnosis not present

## 2014-09-14 DIAGNOSIS — N186 End stage renal disease: Secondary | ICD-10-CM | POA: Diagnosis not present

## 2014-09-14 DIAGNOSIS — D539 Nutritional anemia, unspecified: Secondary | ICD-10-CM | POA: Diagnosis not present

## 2014-09-14 DIAGNOSIS — J40 Bronchitis, not specified as acute or chronic: Secondary | ICD-10-CM | POA: Diagnosis not present

## 2014-09-14 DIAGNOSIS — J151 Pneumonia due to Pseudomonas: Secondary | ICD-10-CM | POA: Diagnosis not present

## 2014-09-14 DIAGNOSIS — M109 Gout, unspecified: Secondary | ICD-10-CM | POA: Diagnosis not present

## 2014-09-16 DIAGNOSIS — N186 End stage renal disease: Secondary | ICD-10-CM | POA: Diagnosis not present

## 2014-09-19 DIAGNOSIS — N186 End stage renal disease: Secondary | ICD-10-CM | POA: Diagnosis not present

## 2014-09-20 DIAGNOSIS — E1129 Type 2 diabetes mellitus with other diabetic kidney complication: Secondary | ICD-10-CM | POA: Diagnosis not present

## 2014-09-20 DIAGNOSIS — Z992 Dependence on renal dialysis: Secondary | ICD-10-CM | POA: Diagnosis not present

## 2014-09-20 DIAGNOSIS — N186 End stage renal disease: Secondary | ICD-10-CM | POA: Diagnosis not present

## 2014-09-21 DIAGNOSIS — J151 Pneumonia due to Pseudomonas: Secondary | ICD-10-CM | POA: Diagnosis not present

## 2014-09-21 DIAGNOSIS — M109 Gout, unspecified: Secondary | ICD-10-CM | POA: Diagnosis not present

## 2014-09-21 DIAGNOSIS — D539 Nutritional anemia, unspecified: Secondary | ICD-10-CM | POA: Diagnosis not present

## 2014-09-21 DIAGNOSIS — J40 Bronchitis, not specified as acute or chronic: Secondary | ICD-10-CM | POA: Diagnosis not present

## 2014-09-21 DIAGNOSIS — N186 End stage renal disease: Secondary | ICD-10-CM | POA: Diagnosis not present

## 2014-09-22 DIAGNOSIS — J40 Bronchitis, not specified as acute or chronic: Secondary | ICD-10-CM | POA: Diagnosis not present

## 2014-09-22 DIAGNOSIS — J151 Pneumonia due to Pseudomonas: Secondary | ICD-10-CM | POA: Diagnosis not present

## 2014-09-22 DIAGNOSIS — M109 Gout, unspecified: Secondary | ICD-10-CM | POA: Diagnosis not present

## 2014-09-22 DIAGNOSIS — D539 Nutritional anemia, unspecified: Secondary | ICD-10-CM | POA: Diagnosis not present

## 2014-09-23 DIAGNOSIS — J40 Bronchitis, not specified as acute or chronic: Secondary | ICD-10-CM | POA: Diagnosis not present

## 2014-09-23 DIAGNOSIS — J151 Pneumonia due to Pseudomonas: Secondary | ICD-10-CM | POA: Diagnosis not present

## 2014-09-23 DIAGNOSIS — N186 End stage renal disease: Secondary | ICD-10-CM | POA: Diagnosis not present

## 2014-09-23 DIAGNOSIS — D539 Nutritional anemia, unspecified: Secondary | ICD-10-CM | POA: Diagnosis not present

## 2014-09-23 DIAGNOSIS — M109 Gout, unspecified: Secondary | ICD-10-CM | POA: Diagnosis not present

## 2014-09-24 DIAGNOSIS — J151 Pneumonia due to Pseudomonas: Secondary | ICD-10-CM | POA: Diagnosis not present

## 2014-09-24 DIAGNOSIS — D539 Nutritional anemia, unspecified: Secondary | ICD-10-CM | POA: Diagnosis not present

## 2014-09-24 DIAGNOSIS — J40 Bronchitis, not specified as acute or chronic: Secondary | ICD-10-CM | POA: Diagnosis not present

## 2014-09-24 DIAGNOSIS — M109 Gout, unspecified: Secondary | ICD-10-CM | POA: Diagnosis not present

## 2014-09-25 DIAGNOSIS — D539 Nutritional anemia, unspecified: Secondary | ICD-10-CM | POA: Diagnosis not present

## 2014-09-25 DIAGNOSIS — J40 Bronchitis, not specified as acute or chronic: Secondary | ICD-10-CM | POA: Diagnosis not present

## 2014-09-25 DIAGNOSIS — J151 Pneumonia due to Pseudomonas: Secondary | ICD-10-CM | POA: Diagnosis not present

## 2014-09-25 DIAGNOSIS — M109 Gout, unspecified: Secondary | ICD-10-CM | POA: Diagnosis not present

## 2014-09-26 DIAGNOSIS — D539 Nutritional anemia, unspecified: Secondary | ICD-10-CM | POA: Diagnosis not present

## 2014-09-26 DIAGNOSIS — M109 Gout, unspecified: Secondary | ICD-10-CM | POA: Diagnosis not present

## 2014-09-26 DIAGNOSIS — J40 Bronchitis, not specified as acute or chronic: Secondary | ICD-10-CM | POA: Diagnosis not present

## 2014-09-26 DIAGNOSIS — J151 Pneumonia due to Pseudomonas: Secondary | ICD-10-CM | POA: Diagnosis not present

## 2014-09-26 DIAGNOSIS — N186 End stage renal disease: Secondary | ICD-10-CM | POA: Diagnosis not present

## 2014-09-27 DIAGNOSIS — J40 Bronchitis, not specified as acute or chronic: Secondary | ICD-10-CM | POA: Diagnosis not present

## 2014-09-27 DIAGNOSIS — D539 Nutritional anemia, unspecified: Secondary | ICD-10-CM | POA: Diagnosis not present

## 2014-09-27 DIAGNOSIS — M109 Gout, unspecified: Secondary | ICD-10-CM | POA: Diagnosis not present

## 2014-09-27 DIAGNOSIS — J151 Pneumonia due to Pseudomonas: Secondary | ICD-10-CM | POA: Diagnosis not present

## 2014-09-28 DIAGNOSIS — J151 Pneumonia due to Pseudomonas: Secondary | ICD-10-CM | POA: Diagnosis not present

## 2014-09-28 DIAGNOSIS — J40 Bronchitis, not specified as acute or chronic: Secondary | ICD-10-CM | POA: Diagnosis not present

## 2014-09-28 DIAGNOSIS — M109 Gout, unspecified: Secondary | ICD-10-CM | POA: Diagnosis not present

## 2014-09-28 DIAGNOSIS — N186 End stage renal disease: Secondary | ICD-10-CM | POA: Diagnosis not present

## 2014-09-28 DIAGNOSIS — D539 Nutritional anemia, unspecified: Secondary | ICD-10-CM | POA: Diagnosis not present

## 2014-09-29 DIAGNOSIS — J151 Pneumonia due to Pseudomonas: Secondary | ICD-10-CM | POA: Diagnosis not present

## 2014-09-29 DIAGNOSIS — D539 Nutritional anemia, unspecified: Secondary | ICD-10-CM | POA: Diagnosis not present

## 2014-09-29 DIAGNOSIS — M109 Gout, unspecified: Secondary | ICD-10-CM | POA: Diagnosis not present

## 2014-09-29 DIAGNOSIS — N186 End stage renal disease: Secondary | ICD-10-CM | POA: Diagnosis not present

## 2014-09-29 DIAGNOSIS — J40 Bronchitis, not specified as acute or chronic: Secondary | ICD-10-CM | POA: Diagnosis not present

## 2014-09-30 DIAGNOSIS — J40 Bronchitis, not specified as acute or chronic: Secondary | ICD-10-CM | POA: Diagnosis not present

## 2014-09-30 DIAGNOSIS — J151 Pneumonia due to Pseudomonas: Secondary | ICD-10-CM | POA: Diagnosis not present

## 2014-09-30 DIAGNOSIS — D539 Nutritional anemia, unspecified: Secondary | ICD-10-CM | POA: Diagnosis not present

## 2014-09-30 DIAGNOSIS — M109 Gout, unspecified: Secondary | ICD-10-CM | POA: Diagnosis not present

## 2014-10-01 DIAGNOSIS — D539 Nutritional anemia, unspecified: Secondary | ICD-10-CM | POA: Diagnosis not present

## 2014-10-01 DIAGNOSIS — J151 Pneumonia due to Pseudomonas: Secondary | ICD-10-CM | POA: Diagnosis not present

## 2014-10-01 DIAGNOSIS — J40 Bronchitis, not specified as acute or chronic: Secondary | ICD-10-CM | POA: Diagnosis not present

## 2014-10-01 DIAGNOSIS — M109 Gout, unspecified: Secondary | ICD-10-CM | POA: Diagnosis not present

## 2014-10-02 DIAGNOSIS — M109 Gout, unspecified: Secondary | ICD-10-CM | POA: Diagnosis not present

## 2014-10-02 DIAGNOSIS — J40 Bronchitis, not specified as acute or chronic: Secondary | ICD-10-CM | POA: Diagnosis not present

## 2014-10-02 DIAGNOSIS — D539 Nutritional anemia, unspecified: Secondary | ICD-10-CM | POA: Diagnosis not present

## 2014-10-02 DIAGNOSIS — J151 Pneumonia due to Pseudomonas: Secondary | ICD-10-CM | POA: Diagnosis not present

## 2014-10-03 DIAGNOSIS — N186 End stage renal disease: Secondary | ICD-10-CM | POA: Diagnosis not present

## 2014-10-03 DIAGNOSIS — J151 Pneumonia due to Pseudomonas: Secondary | ICD-10-CM | POA: Diagnosis not present

## 2014-10-03 DIAGNOSIS — J40 Bronchitis, not specified as acute or chronic: Secondary | ICD-10-CM | POA: Diagnosis not present

## 2014-10-03 DIAGNOSIS — M109 Gout, unspecified: Secondary | ICD-10-CM | POA: Diagnosis not present

## 2014-10-03 DIAGNOSIS — D539 Nutritional anemia, unspecified: Secondary | ICD-10-CM | POA: Diagnosis not present

## 2014-10-04 DIAGNOSIS — M109 Gout, unspecified: Secondary | ICD-10-CM | POA: Diagnosis not present

## 2014-10-04 DIAGNOSIS — J151 Pneumonia due to Pseudomonas: Secondary | ICD-10-CM | POA: Diagnosis not present

## 2014-10-04 DIAGNOSIS — D539 Nutritional anemia, unspecified: Secondary | ICD-10-CM | POA: Diagnosis not present

## 2014-10-04 DIAGNOSIS — J40 Bronchitis, not specified as acute or chronic: Secondary | ICD-10-CM | POA: Diagnosis not present

## 2014-10-05 DIAGNOSIS — M109 Gout, unspecified: Secondary | ICD-10-CM | POA: Diagnosis not present

## 2014-10-05 DIAGNOSIS — N186 End stage renal disease: Secondary | ICD-10-CM | POA: Diagnosis not present

## 2014-10-05 DIAGNOSIS — J151 Pneumonia due to Pseudomonas: Secondary | ICD-10-CM | POA: Diagnosis not present

## 2014-10-05 DIAGNOSIS — D539 Nutritional anemia, unspecified: Secondary | ICD-10-CM | POA: Diagnosis not present

## 2014-10-05 DIAGNOSIS — J40 Bronchitis, not specified as acute or chronic: Secondary | ICD-10-CM | POA: Diagnosis not present

## 2014-10-06 DIAGNOSIS — D539 Nutritional anemia, unspecified: Secondary | ICD-10-CM | POA: Diagnosis not present

## 2014-10-06 DIAGNOSIS — M109 Gout, unspecified: Secondary | ICD-10-CM | POA: Diagnosis not present

## 2014-10-06 DIAGNOSIS — J151 Pneumonia due to Pseudomonas: Secondary | ICD-10-CM | POA: Diagnosis not present

## 2014-10-06 DIAGNOSIS — J40 Bronchitis, not specified as acute or chronic: Secondary | ICD-10-CM | POA: Diagnosis not present

## 2014-10-07 DIAGNOSIS — D539 Nutritional anemia, unspecified: Secondary | ICD-10-CM | POA: Diagnosis not present

## 2014-10-07 DIAGNOSIS — M109 Gout, unspecified: Secondary | ICD-10-CM | POA: Diagnosis not present

## 2014-10-07 DIAGNOSIS — N186 End stage renal disease: Secondary | ICD-10-CM | POA: Diagnosis not present

## 2014-10-07 DIAGNOSIS — J40 Bronchitis, not specified as acute or chronic: Secondary | ICD-10-CM | POA: Diagnosis not present

## 2014-10-07 DIAGNOSIS — J151 Pneumonia due to Pseudomonas: Secondary | ICD-10-CM | POA: Diagnosis not present

## 2014-10-08 DIAGNOSIS — J40 Bronchitis, not specified as acute or chronic: Secondary | ICD-10-CM | POA: Diagnosis not present

## 2014-10-08 DIAGNOSIS — D539 Nutritional anemia, unspecified: Secondary | ICD-10-CM | POA: Diagnosis not present

## 2014-10-08 DIAGNOSIS — J151 Pneumonia due to Pseudomonas: Secondary | ICD-10-CM | POA: Diagnosis not present

## 2014-10-08 DIAGNOSIS — M109 Gout, unspecified: Secondary | ICD-10-CM | POA: Diagnosis not present

## 2014-10-09 DIAGNOSIS — J40 Bronchitis, not specified as acute or chronic: Secondary | ICD-10-CM | POA: Diagnosis not present

## 2014-10-09 DIAGNOSIS — D539 Nutritional anemia, unspecified: Secondary | ICD-10-CM | POA: Diagnosis not present

## 2014-10-09 DIAGNOSIS — J151 Pneumonia due to Pseudomonas: Secondary | ICD-10-CM | POA: Diagnosis not present

## 2014-10-09 DIAGNOSIS — M109 Gout, unspecified: Secondary | ICD-10-CM | POA: Diagnosis not present

## 2014-10-10 DIAGNOSIS — J151 Pneumonia due to Pseudomonas: Secondary | ICD-10-CM | POA: Diagnosis not present

## 2014-10-10 DIAGNOSIS — N186 End stage renal disease: Secondary | ICD-10-CM | POA: Diagnosis not present

## 2014-10-10 DIAGNOSIS — M109 Gout, unspecified: Secondary | ICD-10-CM | POA: Diagnosis not present

## 2014-10-10 DIAGNOSIS — D539 Nutritional anemia, unspecified: Secondary | ICD-10-CM | POA: Diagnosis not present

## 2014-10-10 DIAGNOSIS — J40 Bronchitis, not specified as acute or chronic: Secondary | ICD-10-CM | POA: Diagnosis not present

## 2014-10-11 DIAGNOSIS — M109 Gout, unspecified: Secondary | ICD-10-CM | POA: Diagnosis not present

## 2014-10-11 DIAGNOSIS — J40 Bronchitis, not specified as acute or chronic: Secondary | ICD-10-CM | POA: Diagnosis not present

## 2014-10-11 DIAGNOSIS — D539 Nutritional anemia, unspecified: Secondary | ICD-10-CM | POA: Diagnosis not present

## 2014-10-11 DIAGNOSIS — J151 Pneumonia due to Pseudomonas: Secondary | ICD-10-CM | POA: Diagnosis not present

## 2014-10-12 DIAGNOSIS — N186 End stage renal disease: Secondary | ICD-10-CM | POA: Diagnosis not present

## 2014-10-12 DIAGNOSIS — J40 Bronchitis, not specified as acute or chronic: Secondary | ICD-10-CM | POA: Diagnosis not present

## 2014-10-12 DIAGNOSIS — M109 Gout, unspecified: Secondary | ICD-10-CM | POA: Diagnosis not present

## 2014-10-12 DIAGNOSIS — D539 Nutritional anemia, unspecified: Secondary | ICD-10-CM | POA: Diagnosis not present

## 2014-10-12 DIAGNOSIS — J151 Pneumonia due to Pseudomonas: Secondary | ICD-10-CM | POA: Diagnosis not present

## 2014-10-13 DIAGNOSIS — D539 Nutritional anemia, unspecified: Secondary | ICD-10-CM | POA: Diagnosis not present

## 2014-10-13 DIAGNOSIS — M109 Gout, unspecified: Secondary | ICD-10-CM | POA: Diagnosis not present

## 2014-10-13 DIAGNOSIS — J40 Bronchitis, not specified as acute or chronic: Secondary | ICD-10-CM | POA: Diagnosis not present

## 2014-10-13 DIAGNOSIS — J151 Pneumonia due to Pseudomonas: Secondary | ICD-10-CM | POA: Diagnosis not present

## 2014-10-14 DIAGNOSIS — N186 End stage renal disease: Secondary | ICD-10-CM | POA: Diagnosis not present

## 2014-10-14 DIAGNOSIS — D539 Nutritional anemia, unspecified: Secondary | ICD-10-CM | POA: Diagnosis not present

## 2014-10-14 DIAGNOSIS — M109 Gout, unspecified: Secondary | ICD-10-CM | POA: Diagnosis not present

## 2014-10-14 DIAGNOSIS — J151 Pneumonia due to Pseudomonas: Secondary | ICD-10-CM | POA: Diagnosis not present

## 2014-10-14 DIAGNOSIS — J40 Bronchitis, not specified as acute or chronic: Secondary | ICD-10-CM | POA: Diagnosis not present

## 2014-10-15 DIAGNOSIS — J151 Pneumonia due to Pseudomonas: Secondary | ICD-10-CM | POA: Diagnosis not present

## 2014-10-15 DIAGNOSIS — J40 Bronchitis, not specified as acute or chronic: Secondary | ICD-10-CM | POA: Diagnosis not present

## 2014-10-15 DIAGNOSIS — M109 Gout, unspecified: Secondary | ICD-10-CM | POA: Diagnosis not present

## 2014-10-15 DIAGNOSIS — D539 Nutritional anemia, unspecified: Secondary | ICD-10-CM | POA: Diagnosis not present

## 2014-10-16 DIAGNOSIS — I5022 Chronic systolic (congestive) heart failure: Secondary | ICD-10-CM | POA: Diagnosis not present

## 2014-10-16 DIAGNOSIS — I340 Nonrheumatic mitral (valve) insufficiency: Secondary | ICD-10-CM | POA: Diagnosis not present

## 2014-10-16 DIAGNOSIS — I425 Other restrictive cardiomyopathy: Secondary | ICD-10-CM | POA: Diagnosis not present

## 2014-10-17 DIAGNOSIS — N186 End stage renal disease: Secondary | ICD-10-CM | POA: Diagnosis not present

## 2014-10-19 DIAGNOSIS — N186 End stage renal disease: Secondary | ICD-10-CM | POA: Diagnosis not present

## 2014-10-20 DIAGNOSIS — Z992 Dependence on renal dialysis: Secondary | ICD-10-CM | POA: Diagnosis not present

## 2014-10-20 DIAGNOSIS — N186 End stage renal disease: Secondary | ICD-10-CM | POA: Diagnosis not present

## 2014-10-20 DIAGNOSIS — E1129 Type 2 diabetes mellitus with other diabetic kidney complication: Secondary | ICD-10-CM | POA: Diagnosis not present

## 2014-10-21 DIAGNOSIS — J151 Pneumonia due to Pseudomonas: Secondary | ICD-10-CM | POA: Diagnosis not present

## 2014-10-21 DIAGNOSIS — N186 End stage renal disease: Secondary | ICD-10-CM | POA: Diagnosis not present

## 2014-10-21 DIAGNOSIS — D539 Nutritional anemia, unspecified: Secondary | ICD-10-CM | POA: Diagnosis not present

## 2014-10-21 DIAGNOSIS — M109 Gout, unspecified: Secondary | ICD-10-CM | POA: Diagnosis not present

## 2014-10-21 DIAGNOSIS — J40 Bronchitis, not specified as acute or chronic: Secondary | ICD-10-CM | POA: Diagnosis not present

## 2014-10-22 DIAGNOSIS — D539 Nutritional anemia, unspecified: Secondary | ICD-10-CM | POA: Diagnosis not present

## 2014-10-22 DIAGNOSIS — J40 Bronchitis, not specified as acute or chronic: Secondary | ICD-10-CM | POA: Diagnosis not present

## 2014-10-22 DIAGNOSIS — J151 Pneumonia due to Pseudomonas: Secondary | ICD-10-CM | POA: Diagnosis not present

## 2014-10-22 DIAGNOSIS — M109 Gout, unspecified: Secondary | ICD-10-CM | POA: Diagnosis not present

## 2014-10-23 ENCOUNTER — Other Ambulatory Visit: Payer: Self-pay

## 2014-10-23 DIAGNOSIS — Z1231 Encounter for screening mammogram for malignant neoplasm of breast: Secondary | ICD-10-CM

## 2014-10-23 DIAGNOSIS — J40 Bronchitis, not specified as acute or chronic: Secondary | ICD-10-CM | POA: Diagnosis not present

## 2014-10-23 DIAGNOSIS — J151 Pneumonia due to Pseudomonas: Secondary | ICD-10-CM | POA: Diagnosis not present

## 2014-10-23 DIAGNOSIS — D539 Nutritional anemia, unspecified: Secondary | ICD-10-CM | POA: Diagnosis not present

## 2014-10-23 DIAGNOSIS — M109 Gout, unspecified: Secondary | ICD-10-CM | POA: Diagnosis not present

## 2014-10-24 DIAGNOSIS — J40 Bronchitis, not specified as acute or chronic: Secondary | ICD-10-CM | POA: Diagnosis not present

## 2014-10-24 DIAGNOSIS — J151 Pneumonia due to Pseudomonas: Secondary | ICD-10-CM | POA: Diagnosis not present

## 2014-10-24 DIAGNOSIS — D539 Nutritional anemia, unspecified: Secondary | ICD-10-CM | POA: Diagnosis not present

## 2014-10-24 DIAGNOSIS — N186 End stage renal disease: Secondary | ICD-10-CM | POA: Diagnosis not present

## 2014-10-24 DIAGNOSIS — M109 Gout, unspecified: Secondary | ICD-10-CM | POA: Diagnosis not present

## 2014-10-25 DIAGNOSIS — M109 Gout, unspecified: Secondary | ICD-10-CM | POA: Diagnosis not present

## 2014-10-25 DIAGNOSIS — D539 Nutritional anemia, unspecified: Secondary | ICD-10-CM | POA: Diagnosis not present

## 2014-10-25 DIAGNOSIS — J151 Pneumonia due to Pseudomonas: Secondary | ICD-10-CM | POA: Diagnosis not present

## 2014-10-25 DIAGNOSIS — J40 Bronchitis, not specified as acute or chronic: Secondary | ICD-10-CM | POA: Diagnosis not present

## 2014-10-26 DIAGNOSIS — N186 End stage renal disease: Secondary | ICD-10-CM | POA: Diagnosis not present

## 2014-10-26 DIAGNOSIS — J151 Pneumonia due to Pseudomonas: Secondary | ICD-10-CM | POA: Diagnosis not present

## 2014-10-26 DIAGNOSIS — M109 Gout, unspecified: Secondary | ICD-10-CM | POA: Diagnosis not present

## 2014-10-26 DIAGNOSIS — D539 Nutritional anemia, unspecified: Secondary | ICD-10-CM | POA: Diagnosis not present

## 2014-10-26 DIAGNOSIS — J40 Bronchitis, not specified as acute or chronic: Secondary | ICD-10-CM | POA: Diagnosis not present

## 2014-10-27 DIAGNOSIS — D539 Nutritional anemia, unspecified: Secondary | ICD-10-CM | POA: Diagnosis not present

## 2014-10-27 DIAGNOSIS — M109 Gout, unspecified: Secondary | ICD-10-CM | POA: Diagnosis not present

## 2014-10-27 DIAGNOSIS — J40 Bronchitis, not specified as acute or chronic: Secondary | ICD-10-CM | POA: Diagnosis not present

## 2014-10-27 DIAGNOSIS — J151 Pneumonia due to Pseudomonas: Secondary | ICD-10-CM | POA: Diagnosis not present

## 2014-10-28 DIAGNOSIS — J151 Pneumonia due to Pseudomonas: Secondary | ICD-10-CM | POA: Diagnosis not present

## 2014-10-28 DIAGNOSIS — J40 Bronchitis, not specified as acute or chronic: Secondary | ICD-10-CM | POA: Diagnosis not present

## 2014-10-28 DIAGNOSIS — N186 End stage renal disease: Secondary | ICD-10-CM | POA: Diagnosis not present

## 2014-10-28 DIAGNOSIS — M109 Gout, unspecified: Secondary | ICD-10-CM | POA: Diagnosis not present

## 2014-10-28 DIAGNOSIS — D539 Nutritional anemia, unspecified: Secondary | ICD-10-CM | POA: Diagnosis not present

## 2014-10-29 DIAGNOSIS — J40 Bronchitis, not specified as acute or chronic: Secondary | ICD-10-CM | POA: Diagnosis not present

## 2014-10-29 DIAGNOSIS — M109 Gout, unspecified: Secondary | ICD-10-CM | POA: Diagnosis not present

## 2014-10-29 DIAGNOSIS — J151 Pneumonia due to Pseudomonas: Secondary | ICD-10-CM | POA: Diagnosis not present

## 2014-10-29 DIAGNOSIS — D539 Nutritional anemia, unspecified: Secondary | ICD-10-CM | POA: Diagnosis not present

## 2014-10-30 DIAGNOSIS — J40 Bronchitis, not specified as acute or chronic: Secondary | ICD-10-CM | POA: Diagnosis not present

## 2014-10-30 DIAGNOSIS — J151 Pneumonia due to Pseudomonas: Secondary | ICD-10-CM | POA: Diagnosis not present

## 2014-10-30 DIAGNOSIS — M109 Gout, unspecified: Secondary | ICD-10-CM | POA: Diagnosis not present

## 2014-10-30 DIAGNOSIS — D539 Nutritional anemia, unspecified: Secondary | ICD-10-CM | POA: Diagnosis not present

## 2014-10-31 DIAGNOSIS — J40 Bronchitis, not specified as acute or chronic: Secondary | ICD-10-CM | POA: Diagnosis not present

## 2014-10-31 DIAGNOSIS — N186 End stage renal disease: Secondary | ICD-10-CM | POA: Diagnosis not present

## 2014-10-31 DIAGNOSIS — M109 Gout, unspecified: Secondary | ICD-10-CM | POA: Diagnosis not present

## 2014-10-31 DIAGNOSIS — J151 Pneumonia due to Pseudomonas: Secondary | ICD-10-CM | POA: Diagnosis not present

## 2014-10-31 DIAGNOSIS — D539 Nutritional anemia, unspecified: Secondary | ICD-10-CM | POA: Diagnosis not present

## 2014-11-01 DIAGNOSIS — J40 Bronchitis, not specified as acute or chronic: Secondary | ICD-10-CM | POA: Diagnosis not present

## 2014-11-01 DIAGNOSIS — J151 Pneumonia due to Pseudomonas: Secondary | ICD-10-CM | POA: Diagnosis not present

## 2014-11-01 DIAGNOSIS — M109 Gout, unspecified: Secondary | ICD-10-CM | POA: Diagnosis not present

## 2014-11-01 DIAGNOSIS — D539 Nutritional anemia, unspecified: Secondary | ICD-10-CM | POA: Diagnosis not present

## 2014-11-02 DIAGNOSIS — N186 End stage renal disease: Secondary | ICD-10-CM | POA: Diagnosis not present

## 2014-11-02 DIAGNOSIS — D539 Nutritional anemia, unspecified: Secondary | ICD-10-CM | POA: Diagnosis not present

## 2014-11-02 DIAGNOSIS — M109 Gout, unspecified: Secondary | ICD-10-CM | POA: Diagnosis not present

## 2014-11-02 DIAGNOSIS — J40 Bronchitis, not specified as acute or chronic: Secondary | ICD-10-CM | POA: Diagnosis not present

## 2014-11-02 DIAGNOSIS — J151 Pneumonia due to Pseudomonas: Secondary | ICD-10-CM | POA: Diagnosis not present

## 2014-11-03 DIAGNOSIS — J151 Pneumonia due to Pseudomonas: Secondary | ICD-10-CM | POA: Diagnosis not present

## 2014-11-03 DIAGNOSIS — D539 Nutritional anemia, unspecified: Secondary | ICD-10-CM | POA: Diagnosis not present

## 2014-11-03 DIAGNOSIS — N186 End stage renal disease: Secondary | ICD-10-CM | POA: Diagnosis not present

## 2014-11-03 DIAGNOSIS — J40 Bronchitis, not specified as acute or chronic: Secondary | ICD-10-CM | POA: Diagnosis not present

## 2014-11-03 DIAGNOSIS — M109 Gout, unspecified: Secondary | ICD-10-CM | POA: Diagnosis not present

## 2014-11-04 DIAGNOSIS — J151 Pneumonia due to Pseudomonas: Secondary | ICD-10-CM | POA: Diagnosis not present

## 2014-11-04 DIAGNOSIS — M109 Gout, unspecified: Secondary | ICD-10-CM | POA: Diagnosis not present

## 2014-11-04 DIAGNOSIS — J40 Bronchitis, not specified as acute or chronic: Secondary | ICD-10-CM | POA: Diagnosis not present

## 2014-11-04 DIAGNOSIS — D539 Nutritional anemia, unspecified: Secondary | ICD-10-CM | POA: Diagnosis not present

## 2014-11-05 DIAGNOSIS — J151 Pneumonia due to Pseudomonas: Secondary | ICD-10-CM | POA: Diagnosis not present

## 2014-11-05 DIAGNOSIS — D539 Nutritional anemia, unspecified: Secondary | ICD-10-CM | POA: Diagnosis not present

## 2014-11-05 DIAGNOSIS — M109 Gout, unspecified: Secondary | ICD-10-CM | POA: Diagnosis not present

## 2014-11-05 DIAGNOSIS — J40 Bronchitis, not specified as acute or chronic: Secondary | ICD-10-CM | POA: Diagnosis not present

## 2014-11-06 DIAGNOSIS — M109 Gout, unspecified: Secondary | ICD-10-CM | POA: Diagnosis not present

## 2014-11-06 DIAGNOSIS — J151 Pneumonia due to Pseudomonas: Secondary | ICD-10-CM | POA: Diagnosis not present

## 2014-11-06 DIAGNOSIS — J40 Bronchitis, not specified as acute or chronic: Secondary | ICD-10-CM | POA: Diagnosis not present

## 2014-11-06 DIAGNOSIS — D539 Nutritional anemia, unspecified: Secondary | ICD-10-CM | POA: Diagnosis not present

## 2014-11-07 DIAGNOSIS — M109 Gout, unspecified: Secondary | ICD-10-CM | POA: Diagnosis not present

## 2014-11-07 DIAGNOSIS — J40 Bronchitis, not specified as acute or chronic: Secondary | ICD-10-CM | POA: Diagnosis not present

## 2014-11-07 DIAGNOSIS — N186 End stage renal disease: Secondary | ICD-10-CM | POA: Diagnosis not present

## 2014-11-07 DIAGNOSIS — J151 Pneumonia due to Pseudomonas: Secondary | ICD-10-CM | POA: Diagnosis not present

## 2014-11-07 DIAGNOSIS — D539 Nutritional anemia, unspecified: Secondary | ICD-10-CM | POA: Diagnosis not present

## 2014-11-08 DIAGNOSIS — H40002 Preglaucoma, unspecified, left eye: Secondary | ICD-10-CM | POA: Diagnosis not present

## 2014-11-08 DIAGNOSIS — J151 Pneumonia due to Pseudomonas: Secondary | ICD-10-CM | POA: Diagnosis not present

## 2014-11-08 DIAGNOSIS — E119 Type 2 diabetes mellitus without complications: Secondary | ICD-10-CM | POA: Diagnosis not present

## 2014-11-08 DIAGNOSIS — Z961 Presence of intraocular lens: Secondary | ICD-10-CM | POA: Diagnosis not present

## 2014-11-08 DIAGNOSIS — Z970 Presence of artificial eye: Secondary | ICD-10-CM | POA: Diagnosis not present

## 2014-11-08 DIAGNOSIS — J40 Bronchitis, not specified as acute or chronic: Secondary | ICD-10-CM | POA: Diagnosis not present

## 2014-11-08 DIAGNOSIS — M109 Gout, unspecified: Secondary | ICD-10-CM | POA: Diagnosis not present

## 2014-11-08 DIAGNOSIS — D539 Nutritional anemia, unspecified: Secondary | ICD-10-CM | POA: Diagnosis not present

## 2014-11-09 DIAGNOSIS — J151 Pneumonia due to Pseudomonas: Secondary | ICD-10-CM | POA: Diagnosis not present

## 2014-11-09 DIAGNOSIS — N186 End stage renal disease: Secondary | ICD-10-CM | POA: Diagnosis not present

## 2014-11-09 DIAGNOSIS — J40 Bronchitis, not specified as acute or chronic: Secondary | ICD-10-CM | POA: Diagnosis not present

## 2014-11-09 DIAGNOSIS — D539 Nutritional anemia, unspecified: Secondary | ICD-10-CM | POA: Diagnosis not present

## 2014-11-09 DIAGNOSIS — M109 Gout, unspecified: Secondary | ICD-10-CM | POA: Diagnosis not present

## 2014-11-10 DIAGNOSIS — D539 Nutritional anemia, unspecified: Secondary | ICD-10-CM | POA: Diagnosis not present

## 2014-11-10 DIAGNOSIS — M109 Gout, unspecified: Secondary | ICD-10-CM | POA: Diagnosis not present

## 2014-11-10 DIAGNOSIS — J151 Pneumonia due to Pseudomonas: Secondary | ICD-10-CM | POA: Diagnosis not present

## 2014-11-10 DIAGNOSIS — J40 Bronchitis, not specified as acute or chronic: Secondary | ICD-10-CM | POA: Diagnosis not present

## 2014-11-11 DIAGNOSIS — N186 End stage renal disease: Secondary | ICD-10-CM | POA: Diagnosis not present

## 2014-11-11 DIAGNOSIS — D539 Nutritional anemia, unspecified: Secondary | ICD-10-CM | POA: Diagnosis not present

## 2014-11-11 DIAGNOSIS — J151 Pneumonia due to Pseudomonas: Secondary | ICD-10-CM | POA: Diagnosis not present

## 2014-11-11 DIAGNOSIS — J40 Bronchitis, not specified as acute or chronic: Secondary | ICD-10-CM | POA: Diagnosis not present

## 2014-11-11 DIAGNOSIS — M109 Gout, unspecified: Secondary | ICD-10-CM | POA: Diagnosis not present

## 2014-11-12 DIAGNOSIS — M109 Gout, unspecified: Secondary | ICD-10-CM | POA: Diagnosis not present

## 2014-11-12 DIAGNOSIS — D539 Nutritional anemia, unspecified: Secondary | ICD-10-CM | POA: Diagnosis not present

## 2014-11-12 DIAGNOSIS — J40 Bronchitis, not specified as acute or chronic: Secondary | ICD-10-CM | POA: Diagnosis not present

## 2014-11-12 DIAGNOSIS — J151 Pneumonia due to Pseudomonas: Secondary | ICD-10-CM | POA: Diagnosis not present

## 2014-11-13 DIAGNOSIS — M109 Gout, unspecified: Secondary | ICD-10-CM | POA: Diagnosis not present

## 2014-11-13 DIAGNOSIS — D539 Nutritional anemia, unspecified: Secondary | ICD-10-CM | POA: Diagnosis not present

## 2014-11-13 DIAGNOSIS — J40 Bronchitis, not specified as acute or chronic: Secondary | ICD-10-CM | POA: Diagnosis not present

## 2014-11-13 DIAGNOSIS — J151 Pneumonia due to Pseudomonas: Secondary | ICD-10-CM | POA: Diagnosis not present

## 2014-11-14 DIAGNOSIS — M109 Gout, unspecified: Secondary | ICD-10-CM | POA: Diagnosis not present

## 2014-11-14 DIAGNOSIS — J40 Bronchitis, not specified as acute or chronic: Secondary | ICD-10-CM | POA: Diagnosis not present

## 2014-11-14 DIAGNOSIS — D539 Nutritional anemia, unspecified: Secondary | ICD-10-CM | POA: Diagnosis not present

## 2014-11-14 DIAGNOSIS — J151 Pneumonia due to Pseudomonas: Secondary | ICD-10-CM | POA: Diagnosis not present

## 2014-11-14 DIAGNOSIS — N186 End stage renal disease: Secondary | ICD-10-CM | POA: Diagnosis not present

## 2014-11-15 DIAGNOSIS — D539 Nutritional anemia, unspecified: Secondary | ICD-10-CM | POA: Diagnosis not present

## 2014-11-15 DIAGNOSIS — J151 Pneumonia due to Pseudomonas: Secondary | ICD-10-CM | POA: Diagnosis not present

## 2014-11-15 DIAGNOSIS — J40 Bronchitis, not specified as acute or chronic: Secondary | ICD-10-CM | POA: Diagnosis not present

## 2014-11-15 DIAGNOSIS — M109 Gout, unspecified: Secondary | ICD-10-CM | POA: Diagnosis not present

## 2014-11-16 DIAGNOSIS — M109 Gout, unspecified: Secondary | ICD-10-CM | POA: Diagnosis not present

## 2014-11-16 DIAGNOSIS — J40 Bronchitis, not specified as acute or chronic: Secondary | ICD-10-CM | POA: Diagnosis not present

## 2014-11-16 DIAGNOSIS — D539 Nutritional anemia, unspecified: Secondary | ICD-10-CM | POA: Diagnosis not present

## 2014-11-16 DIAGNOSIS — N186 End stage renal disease: Secondary | ICD-10-CM | POA: Diagnosis not present

## 2014-11-16 DIAGNOSIS — J151 Pneumonia due to Pseudomonas: Secondary | ICD-10-CM | POA: Diagnosis not present

## 2014-11-17 DIAGNOSIS — M109 Gout, unspecified: Secondary | ICD-10-CM | POA: Diagnosis not present

## 2014-11-17 DIAGNOSIS — J151 Pneumonia due to Pseudomonas: Secondary | ICD-10-CM | POA: Diagnosis not present

## 2014-11-17 DIAGNOSIS — J40 Bronchitis, not specified as acute or chronic: Secondary | ICD-10-CM | POA: Diagnosis not present

## 2014-11-17 DIAGNOSIS — D539 Nutritional anemia, unspecified: Secondary | ICD-10-CM | POA: Diagnosis not present

## 2014-11-18 DIAGNOSIS — J151 Pneumonia due to Pseudomonas: Secondary | ICD-10-CM | POA: Diagnosis not present

## 2014-11-18 DIAGNOSIS — D539 Nutritional anemia, unspecified: Secondary | ICD-10-CM | POA: Diagnosis not present

## 2014-11-18 DIAGNOSIS — N186 End stage renal disease: Secondary | ICD-10-CM | POA: Diagnosis not present

## 2014-11-18 DIAGNOSIS — J40 Bronchitis, not specified as acute or chronic: Secondary | ICD-10-CM | POA: Diagnosis not present

## 2014-11-18 DIAGNOSIS — M109 Gout, unspecified: Secondary | ICD-10-CM | POA: Diagnosis not present

## 2014-11-19 DIAGNOSIS — D539 Nutritional anemia, unspecified: Secondary | ICD-10-CM | POA: Diagnosis not present

## 2014-11-19 DIAGNOSIS — J40 Bronchitis, not specified as acute or chronic: Secondary | ICD-10-CM | POA: Diagnosis not present

## 2014-11-19 DIAGNOSIS — J151 Pneumonia due to Pseudomonas: Secondary | ICD-10-CM | POA: Diagnosis not present

## 2014-11-19 DIAGNOSIS — M109 Gout, unspecified: Secondary | ICD-10-CM | POA: Diagnosis not present

## 2014-11-20 DIAGNOSIS — Z992 Dependence on renal dialysis: Secondary | ICD-10-CM | POA: Diagnosis not present

## 2014-11-20 DIAGNOSIS — N186 End stage renal disease: Secondary | ICD-10-CM | POA: Diagnosis not present

## 2014-11-20 DIAGNOSIS — E1129 Type 2 diabetes mellitus with other diabetic kidney complication: Secondary | ICD-10-CM | POA: Diagnosis not present

## 2014-11-21 DIAGNOSIS — J151 Pneumonia due to Pseudomonas: Secondary | ICD-10-CM | POA: Diagnosis not present

## 2014-11-21 DIAGNOSIS — M109 Gout, unspecified: Secondary | ICD-10-CM | POA: Diagnosis not present

## 2014-11-21 DIAGNOSIS — D539 Nutritional anemia, unspecified: Secondary | ICD-10-CM | POA: Diagnosis not present

## 2014-11-21 DIAGNOSIS — N186 End stage renal disease: Secondary | ICD-10-CM | POA: Diagnosis not present

## 2014-11-21 DIAGNOSIS — J40 Bronchitis, not specified as acute or chronic: Secondary | ICD-10-CM | POA: Diagnosis not present

## 2014-11-22 DIAGNOSIS — D539 Nutritional anemia, unspecified: Secondary | ICD-10-CM | POA: Diagnosis not present

## 2014-11-22 DIAGNOSIS — J151 Pneumonia due to Pseudomonas: Secondary | ICD-10-CM | POA: Diagnosis not present

## 2014-11-22 DIAGNOSIS — M109 Gout, unspecified: Secondary | ICD-10-CM | POA: Diagnosis not present

## 2014-11-22 DIAGNOSIS — J40 Bronchitis, not specified as acute or chronic: Secondary | ICD-10-CM | POA: Diagnosis not present

## 2014-11-23 DIAGNOSIS — J40 Bronchitis, not specified as acute or chronic: Secondary | ICD-10-CM | POA: Diagnosis not present

## 2014-11-23 DIAGNOSIS — J151 Pneumonia due to Pseudomonas: Secondary | ICD-10-CM | POA: Diagnosis not present

## 2014-11-23 DIAGNOSIS — M109 Gout, unspecified: Secondary | ICD-10-CM | POA: Diagnosis not present

## 2014-11-23 DIAGNOSIS — D539 Nutritional anemia, unspecified: Secondary | ICD-10-CM | POA: Diagnosis not present

## 2014-11-23 DIAGNOSIS — N186 End stage renal disease: Secondary | ICD-10-CM | POA: Diagnosis not present

## 2014-11-24 DIAGNOSIS — M109 Gout, unspecified: Secondary | ICD-10-CM | POA: Diagnosis not present

## 2014-11-24 DIAGNOSIS — D539 Nutritional anemia, unspecified: Secondary | ICD-10-CM | POA: Diagnosis not present

## 2014-11-24 DIAGNOSIS — J40 Bronchitis, not specified as acute or chronic: Secondary | ICD-10-CM | POA: Diagnosis not present

## 2014-11-24 DIAGNOSIS — J151 Pneumonia due to Pseudomonas: Secondary | ICD-10-CM | POA: Diagnosis not present

## 2014-11-25 DIAGNOSIS — N186 End stage renal disease: Secondary | ICD-10-CM | POA: Diagnosis not present

## 2014-11-25 DIAGNOSIS — J40 Bronchitis, not specified as acute or chronic: Secondary | ICD-10-CM | POA: Diagnosis not present

## 2014-11-25 DIAGNOSIS — M109 Gout, unspecified: Secondary | ICD-10-CM | POA: Diagnosis not present

## 2014-11-25 DIAGNOSIS — J151 Pneumonia due to Pseudomonas: Secondary | ICD-10-CM | POA: Diagnosis not present

## 2014-11-25 DIAGNOSIS — D539 Nutritional anemia, unspecified: Secondary | ICD-10-CM | POA: Diagnosis not present

## 2014-11-26 DIAGNOSIS — J40 Bronchitis, not specified as acute or chronic: Secondary | ICD-10-CM | POA: Diagnosis not present

## 2014-11-26 DIAGNOSIS — J151 Pneumonia due to Pseudomonas: Secondary | ICD-10-CM | POA: Diagnosis not present

## 2014-11-26 DIAGNOSIS — M109 Gout, unspecified: Secondary | ICD-10-CM | POA: Diagnosis not present

## 2014-11-26 DIAGNOSIS — D539 Nutritional anemia, unspecified: Secondary | ICD-10-CM | POA: Diagnosis not present

## 2014-11-27 DIAGNOSIS — M109 Gout, unspecified: Secondary | ICD-10-CM | POA: Diagnosis not present

## 2014-11-27 DIAGNOSIS — J40 Bronchitis, not specified as acute or chronic: Secondary | ICD-10-CM | POA: Diagnosis not present

## 2014-11-27 DIAGNOSIS — J151 Pneumonia due to Pseudomonas: Secondary | ICD-10-CM | POA: Diagnosis not present

## 2014-11-27 DIAGNOSIS — D539 Nutritional anemia, unspecified: Secondary | ICD-10-CM | POA: Diagnosis not present

## 2014-11-28 DIAGNOSIS — N186 End stage renal disease: Secondary | ICD-10-CM | POA: Diagnosis not present

## 2014-11-28 DIAGNOSIS — J151 Pneumonia due to Pseudomonas: Secondary | ICD-10-CM | POA: Diagnosis not present

## 2014-11-28 DIAGNOSIS — M109 Gout, unspecified: Secondary | ICD-10-CM | POA: Diagnosis not present

## 2014-11-28 DIAGNOSIS — D539 Nutritional anemia, unspecified: Secondary | ICD-10-CM | POA: Diagnosis not present

## 2014-11-28 DIAGNOSIS — J40 Bronchitis, not specified as acute or chronic: Secondary | ICD-10-CM | POA: Diagnosis not present

## 2014-11-29 DIAGNOSIS — Z6841 Body Mass Index (BMI) 40.0 and over, adult: Secondary | ICD-10-CM | POA: Diagnosis not present

## 2014-11-29 DIAGNOSIS — D539 Nutritional anemia, unspecified: Secondary | ICD-10-CM | POA: Diagnosis not present

## 2014-11-29 DIAGNOSIS — E119 Type 2 diabetes mellitus without complications: Secondary | ICD-10-CM | POA: Diagnosis not present

## 2014-11-29 DIAGNOSIS — E1140 Type 2 diabetes mellitus with diabetic neuropathy, unspecified: Secondary | ICD-10-CM | POA: Diagnosis not present

## 2014-11-29 DIAGNOSIS — J151 Pneumonia due to Pseudomonas: Secondary | ICD-10-CM | POA: Diagnosis not present

## 2014-11-29 DIAGNOSIS — M109 Gout, unspecified: Secondary | ICD-10-CM | POA: Diagnosis not present

## 2014-11-29 DIAGNOSIS — J40 Bronchitis, not specified as acute or chronic: Secondary | ICD-10-CM | POA: Diagnosis not present

## 2014-11-29 DIAGNOSIS — Z23 Encounter for immunization: Secondary | ICD-10-CM | POA: Diagnosis not present

## 2014-11-30 DIAGNOSIS — M109 Gout, unspecified: Secondary | ICD-10-CM | POA: Diagnosis not present

## 2014-11-30 DIAGNOSIS — D539 Nutritional anemia, unspecified: Secondary | ICD-10-CM | POA: Diagnosis not present

## 2014-11-30 DIAGNOSIS — J151 Pneumonia due to Pseudomonas: Secondary | ICD-10-CM | POA: Diagnosis not present

## 2014-11-30 DIAGNOSIS — N186 End stage renal disease: Secondary | ICD-10-CM | POA: Diagnosis not present

## 2014-11-30 DIAGNOSIS — J40 Bronchitis, not specified as acute or chronic: Secondary | ICD-10-CM | POA: Diagnosis not present

## 2014-12-01 ENCOUNTER — Ambulatory Visit: Payer: Medicare HMO

## 2014-12-01 DIAGNOSIS — M109 Gout, unspecified: Secondary | ICD-10-CM | POA: Diagnosis not present

## 2014-12-01 DIAGNOSIS — D539 Nutritional anemia, unspecified: Secondary | ICD-10-CM | POA: Diagnosis not present

## 2014-12-01 DIAGNOSIS — J151 Pneumonia due to Pseudomonas: Secondary | ICD-10-CM | POA: Diagnosis not present

## 2014-12-01 DIAGNOSIS — J40 Bronchitis, not specified as acute or chronic: Secondary | ICD-10-CM | POA: Diagnosis not present

## 2014-12-02 DIAGNOSIS — J151 Pneumonia due to Pseudomonas: Secondary | ICD-10-CM | POA: Diagnosis not present

## 2014-12-02 DIAGNOSIS — J40 Bronchitis, not specified as acute or chronic: Secondary | ICD-10-CM | POA: Diagnosis not present

## 2014-12-02 DIAGNOSIS — M109 Gout, unspecified: Secondary | ICD-10-CM | POA: Diagnosis not present

## 2014-12-02 DIAGNOSIS — D539 Nutritional anemia, unspecified: Secondary | ICD-10-CM | POA: Diagnosis not present

## 2014-12-02 DIAGNOSIS — N186 End stage renal disease: Secondary | ICD-10-CM | POA: Diagnosis not present

## 2014-12-03 DIAGNOSIS — J40 Bronchitis, not specified as acute or chronic: Secondary | ICD-10-CM | POA: Diagnosis not present

## 2014-12-03 DIAGNOSIS — M109 Gout, unspecified: Secondary | ICD-10-CM | POA: Diagnosis not present

## 2014-12-03 DIAGNOSIS — D539 Nutritional anemia, unspecified: Secondary | ICD-10-CM | POA: Diagnosis not present

## 2014-12-03 DIAGNOSIS — J151 Pneumonia due to Pseudomonas: Secondary | ICD-10-CM | POA: Diagnosis not present

## 2014-12-04 ENCOUNTER — Emergency Department (HOSPITAL_COMMUNITY): Payer: Commercial Managed Care - HMO

## 2014-12-04 ENCOUNTER — Encounter (HOSPITAL_COMMUNITY): Payer: Self-pay | Admitting: *Deleted

## 2014-12-04 ENCOUNTER — Inpatient Hospital Stay (HOSPITAL_COMMUNITY)
Admission: EM | Admit: 2014-12-04 | Discharge: 2014-12-07 | DRG: 917 | Disposition: A | Discharge status: Home / Self Care | Payer: Commercial Managed Care - HMO | Attending: Internal Medicine | Admitting: Internal Medicine

## 2014-12-04 DIAGNOSIS — T426X5A Adverse effect of other antiepileptic and sedative-hypnotic drugs, initial encounter: Secondary | ICD-10-CM | POA: Diagnosis present

## 2014-12-04 DIAGNOSIS — T426X1A Poisoning by other antiepileptic and sedative-hypnotic drugs, accidental (unintentional), initial encounter: Principal | ICD-10-CM | POA: Diagnosis present

## 2014-12-04 DIAGNOSIS — Z7982 Long term (current) use of aspirin: Secondary | ICD-10-CM

## 2014-12-04 DIAGNOSIS — Z794 Long term (current) use of insulin: Secondary | ICD-10-CM | POA: Diagnosis not present

## 2014-12-04 DIAGNOSIS — E1142 Type 2 diabetes mellitus with diabetic polyneuropathy: Secondary | ICD-10-CM | POA: Diagnosis present

## 2014-12-04 DIAGNOSIS — I69351 Hemiplegia and hemiparesis following cerebral infarction affecting right dominant side: Secondary | ICD-10-CM

## 2014-12-04 DIAGNOSIS — G9341 Metabolic encephalopathy: Secondary | ICD-10-CM | POA: Insufficient documentation

## 2014-12-04 DIAGNOSIS — I5032 Chronic diastolic (congestive) heart failure: Secondary | ICD-10-CM | POA: Diagnosis present

## 2014-12-04 DIAGNOSIS — T50905A Adverse effect of unspecified drugs, medicaments and biological substances, initial encounter: Secondary | ICD-10-CM | POA: Diagnosis present

## 2014-12-04 DIAGNOSIS — I639 Cerebral infarction, unspecified: Secondary | ICD-10-CM

## 2014-12-04 DIAGNOSIS — N186 End stage renal disease: Secondary | ICD-10-CM | POA: Diagnosis not present

## 2014-12-04 DIAGNOSIS — I951 Orthostatic hypotension: Secondary | ICD-10-CM | POA: Diagnosis not present

## 2014-12-04 DIAGNOSIS — Z992 Dependence on renal dialysis: Secondary | ICD-10-CM | POA: Diagnosis not present

## 2014-12-04 DIAGNOSIS — F40240 Claustrophobia: Secondary | ICD-10-CM | POA: Diagnosis present

## 2014-12-04 DIAGNOSIS — I2510 Atherosclerotic heart disease of native coronary artery without angina pectoris: Secondary | ICD-10-CM | POA: Diagnosis present

## 2014-12-04 DIAGNOSIS — R29818 Other symptoms and signs involving the nervous system: Secondary | ICD-10-CM | POA: Diagnosis present

## 2014-12-04 DIAGNOSIS — H5441 Blindness, right eye, normal vision left eye: Secondary | ICD-10-CM | POA: Diagnosis not present

## 2014-12-04 DIAGNOSIS — Z9989 Dependence on other enabling machines and devices: Secondary | ICD-10-CM

## 2014-12-04 DIAGNOSIS — E1140 Type 2 diabetes mellitus with diabetic neuropathy, unspecified: Secondary | ICD-10-CM | POA: Diagnosis present

## 2014-12-04 DIAGNOSIS — I6529 Occlusion and stenosis of unspecified carotid artery: Secondary | ICD-10-CM | POA: Diagnosis not present

## 2014-12-04 DIAGNOSIS — D649 Anemia, unspecified: Secondary | ICD-10-CM | POA: Diagnosis present

## 2014-12-04 DIAGNOSIS — E1151 Type 2 diabetes mellitus with diabetic peripheral angiopathy without gangrene: Secondary | ICD-10-CM | POA: Diagnosis present

## 2014-12-04 DIAGNOSIS — N2581 Secondary hyperparathyroidism of renal origin: Secondary | ICD-10-CM | POA: Diagnosis not present

## 2014-12-04 DIAGNOSIS — G4733 Obstructive sleep apnea (adult) (pediatric): Secondary | ICD-10-CM | POA: Diagnosis present

## 2014-12-04 DIAGNOSIS — G92 Toxic encephalopathy: Secondary | ICD-10-CM | POA: Diagnosis not present

## 2014-12-04 DIAGNOSIS — I132 Hypertensive heart and chronic kidney disease with heart failure and with stage 5 chronic kidney disease, or end stage renal disease: Secondary | ICD-10-CM | POA: Diagnosis present

## 2014-12-04 DIAGNOSIS — I252 Old myocardial infarction: Secondary | ICD-10-CM

## 2014-12-04 DIAGNOSIS — N390 Urinary tract infection, site not specified: Secondary | ICD-10-CM | POA: Diagnosis present

## 2014-12-04 DIAGNOSIS — I959 Hypotension, unspecified: Secondary | ICD-10-CM | POA: Diagnosis present

## 2014-12-04 DIAGNOSIS — Z87891 Personal history of nicotine dependence: Secondary | ICD-10-CM | POA: Diagnosis not present

## 2014-12-04 DIAGNOSIS — B9789 Other viral agents as the cause of diseases classified elsewhere: Secondary | ICD-10-CM | POA: Diagnosis present

## 2014-12-04 DIAGNOSIS — R531 Weakness: Secondary | ICD-10-CM | POA: Diagnosis present

## 2014-12-04 DIAGNOSIS — E1122 Type 2 diabetes mellitus with diabetic chronic kidney disease: Secondary | ICD-10-CM | POA: Diagnosis not present

## 2014-12-04 DIAGNOSIS — E875 Hyperkalemia: Secondary | ICD-10-CM | POA: Diagnosis not present

## 2014-12-04 DIAGNOSIS — Z66 Do not resuscitate: Secondary | ICD-10-CM | POA: Diagnosis present

## 2014-12-04 DIAGNOSIS — I509 Heart failure, unspecified: Secondary | ICD-10-CM | POA: Insufficient documentation

## 2014-12-04 DIAGNOSIS — R195 Other fecal abnormalities: Secondary | ICD-10-CM | POA: Diagnosis not present

## 2014-12-04 DIAGNOSIS — D638 Anemia in other chronic diseases classified elsewhere: Secondary | ICD-10-CM | POA: Diagnosis present

## 2014-12-04 DIAGNOSIS — G253 Myoclonus: Secondary | ICD-10-CM | POA: Diagnosis present

## 2014-12-04 DIAGNOSIS — Z6841 Body Mass Index (BMI) 40.0 and over, adult: Secondary | ICD-10-CM | POA: Diagnosis not present

## 2014-12-04 DIAGNOSIS — R251 Tremor, unspecified: Secondary | ICD-10-CM | POA: Insufficient documentation

## 2014-12-04 DIAGNOSIS — I120 Hypertensive chronic kidney disease with stage 5 chronic kidney disease or end stage renal disease: Secondary | ICD-10-CM | POA: Diagnosis present

## 2014-12-04 DIAGNOSIS — R42 Dizziness and giddiness: Secondary | ICD-10-CM | POA: Diagnosis not present

## 2014-12-04 DIAGNOSIS — I6930 Unspecified sequelae of cerebral infarction: Secondary | ICD-10-CM | POA: Diagnosis not present

## 2014-12-04 DIAGNOSIS — H5440 Blindness, one eye, unspecified eye: Secondary | ICD-10-CM | POA: Insufficient documentation

## 2014-12-04 DIAGNOSIS — I219 Acute myocardial infarction, unspecified: Secondary | ICD-10-CM | POA: Insufficient documentation

## 2014-12-04 DIAGNOSIS — R0600 Dyspnea, unspecified: Secondary | ICD-10-CM | POA: Diagnosis not present

## 2014-12-04 LAB — DIFFERENTIAL
BASOS PCT: 1 %
Basophils Absolute: 0.1 10*3/uL (ref 0.0–0.1)
EOS ABS: 0.4 10*3/uL (ref 0.0–0.7)
EOS PCT: 5 %
LYMPHS PCT: 33 %
Lymphs Abs: 2.6 10*3/uL (ref 0.7–4.0)
MONO ABS: 0.5 10*3/uL (ref 0.1–1.0)
Monocytes Relative: 7 %
Neutro Abs: 4.2 10*3/uL (ref 1.7–7.7)
Neutrophils Relative %: 54 %

## 2014-12-04 LAB — COMPREHENSIVE METABOLIC PANEL
ALT: 15 U/L (ref 14–54)
ANION GAP: 12 (ref 5–15)
AST: 23 U/L (ref 15–41)
Albumin: 3.5 g/dL (ref 3.5–5.0)
Alkaline Phosphatase: 41 U/L (ref 38–126)
BILIRUBIN TOTAL: 0.6 mg/dL (ref 0.3–1.2)
BUN: 59 mg/dL — ABNORMAL HIGH (ref 6–20)
CHLORIDE: 100 mmol/L — AB (ref 101–111)
CO2: 26 mmol/L (ref 22–32)
Calcium: 10.8 mg/dL — ABNORMAL HIGH (ref 8.9–10.3)
Creatinine, Ser: 10.56 mg/dL — ABNORMAL HIGH (ref 0.44–1.00)
GFR, EST AFRICAN AMERICAN: 4 mL/min — AB (ref >60)
GFR, EST NON AFRICAN AMERICAN: 3 mL/min — AB (ref >60)
Glucose, Bld: 131 mg/dL — ABNORMAL HIGH (ref 65–99)
POTASSIUM: 6.6 mmol/L — AB (ref 3.5–5.1)
Sodium: 138 mmol/L (ref 135–145)
TOTAL PROTEIN: 7.5 g/dL (ref 6.5–8.1)

## 2014-12-04 LAB — PROTIME-INR
INR: 1.16 (ref 0.00–1.49)
PROTHROMBIN TIME: 15 s (ref 11.6–15.2)

## 2014-12-04 LAB — CBC
HCT: 36.1 % (ref 36.0–46.0)
HEMATOCRIT: 34.6 % — AB (ref 36.0–46.0)
HEMOGLOBIN: 11 g/dL — AB (ref 12.0–15.0)
Hemoglobin: 11.2 g/dL — ABNORMAL LOW (ref 12.0–15.0)
MCH: 29.9 pg (ref 26.0–34.0)
MCH: 30.1 pg (ref 26.0–34.0)
MCHC: 31 g/dL (ref 30.0–36.0)
MCHC: 31.8 g/dL (ref 30.0–36.0)
MCV: 96.3 fL (ref 78.0–100.0)
MCV: 94.5 fL (ref 78.0–100.0)
PLATELETS: 179 10*3/uL (ref 150–400)
Platelets: 178 10*3/uL (ref 150–400)
RBC: 3.75 MIL/uL — AB (ref 3.87–5.11)
RBC: 3.66 MIL/uL — AB (ref 3.87–5.11)
RDW: 16.6 % — ABNORMAL HIGH (ref 11.5–15.5)
RDW: 16.4 % — ABNORMAL HIGH (ref 11.5–15.5)
WBC: 7.7 10*3/uL (ref 4.0–10.5)
WBC: 8.6 10*3/uL (ref 4.0–10.5)

## 2014-12-04 LAB — RENAL FUNCTION PANEL
Albumin: 3.3 g/dL — ABNORMAL LOW (ref 3.5–5.0)
Anion gap: 15 (ref 5–15)
BUN: 64 mg/dL — ABNORMAL HIGH (ref 6–20)
CO2: 23 mmol/L (ref 22–32)
Calcium: 10.1 mg/dL (ref 8.9–10.3)
Chloride: 100 mmol/L — ABNORMAL LOW (ref 101–111)
Creatinine, Ser: 10.89 mg/dL — ABNORMAL HIGH (ref 0.44–1.00)
GFR calc Af Amer: 4 mL/min — ABNORMAL LOW (ref >60)
GFR calc non Af Amer: 3 mL/min — ABNORMAL LOW (ref >60)
Glucose, Bld: 72 mg/dL (ref 65–99)
Phosphorus: 4.7 mg/dL — ABNORMAL HIGH (ref 2.5–4.6)
Potassium: 7.2 mmol/L (ref 3.5–5.1)
Sodium: 138 mmol/L (ref 135–145)

## 2014-12-04 LAB — APTT: aPTT: 37 seconds (ref 24–37)

## 2014-12-04 LAB — I-STAT CHEM 8, ED
BUN: 57 mg/dL — ABNORMAL HIGH (ref 6–20)
CALCIUM ION: 1.28 mmol/L (ref 1.13–1.30)
Chloride: 102 mmol/L (ref 101–111)
Creatinine, Ser: 9.9 mg/dL — ABNORMAL HIGH (ref 0.44–1.00)
GLUCOSE: 125 mg/dL — AB (ref 65–99)
HEMATOCRIT: 39 % (ref 36.0–46.0)
Hemoglobin: 13.3 g/dL (ref 12.0–15.0)
Potassium: 6.2 mmol/L (ref 3.5–5.1)
SODIUM: 137 mmol/L (ref 135–145)
TCO2: 25 mmol/L (ref 0–100)

## 2014-12-04 LAB — I-STAT TROPONIN, ED: TROPONIN I, POC: 0 ng/mL (ref 0.00–0.08)

## 2014-12-04 LAB — POTASSIUM: Potassium: 3.8 mmol/L (ref 3.5–5.1)

## 2014-12-04 LAB — CBG MONITORING, ED: GLUCOSE-CAPILLARY: 130 mg/dL — AB (ref 65–99)

## 2014-12-04 MED ORDER — LIDOCAINE-PRILOCAINE 2.5-2.5 % EX CREA: 1.0000 "application " | TOPICAL_CREAM | CUTANEOUS | Status: DC | PRN

## 2014-12-04 MED ORDER — LIDOCAINE HCL (PF) 1 % IJ SOLN: 5.0000 mL | INTRAMUSCULAR | Status: DC | PRN

## 2014-12-04 MED ORDER — SODIUM POLYSTYRENE SULFONATE 15 GM/60ML PO SUSP: 30.0000 g | Freq: Once | ORAL | Status: DC

## 2014-12-04 MED ORDER — SODIUM CHLORIDE 0.9 % IV SOLN: 100.0000 mL | INTRAVENOUS | Status: DC | PRN

## 2014-12-04 MED ORDER — PENTAFLUOROPROP-TETRAFLUOROETH EX AERO: 1.0000 "application " | INHALATION_SPRAY | CUTANEOUS | Status: DC | PRN

## 2014-12-04 MED ORDER — HEPARIN SODIUM (PORCINE) 1000 UNIT/ML DIALYSIS: 100.0000 [IU]/kg | INTRAMUSCULAR | Status: DC | PRN

## 2014-12-04 MED ORDER — HEPARIN SODIUM (PORCINE) 1000 UNIT/ML DIALYSIS: 1000.0000 [IU] | INTRAMUSCULAR | Status: DC | PRN

## 2014-12-04 MED ORDER — IPRATROPIUM-ALBUTEROL 0.5-2.5 (3) MG/3ML IN SOLN
3.0000 mL | RESPIRATORY_TRACT | Status: DC
  Administered 2014-12-04 – 2014-12-05 (×2): 3 mL via RESPIRATORY_TRACT
  Filled 2014-12-04 (×3): qty 3

## 2014-12-04 MED ORDER — ALTEPLASE 2 MG IJ SOLR: 2.0000 mg | Freq: Once | INTRAMUSCULAR | Status: DC | PRN

## 2014-12-04 NOTE — ED Notes (Signed)
Pt currently in radiology. K+ 6.6, charge RN and Tech first aware of needing next available room.

## 2014-12-04 NOTE — H&P (Signed)
[facility] History and Physical                                                                                    [name], is a 69 y.o. female  MRN: [id number]   DOB - Oct 09, 1945  Admit Date - 12/04/2014  Outpatient Primary MD for the patient is [name], MD  Referring Physician:  Dr. Johnney Killian, EDP  Chief Complaint:   Chief Complaint  Patient presents with  . Weakness     HPI  [name]  is a 69 y.o. female, with coronary artery disease, CHF, diabetes with neuropathy, in stage renal disease (dialyzes TTS) who presents to the emergency department today with neurological deficits. According to her family the patient developed uncontrollable twitching in her right arm on Saturday. On Sunday she developed weakness in her legs and her daughter noticed she was leaning to one side. [facility] [employer] and developed stuttering speech. She is complaining of unbalanced gait, and significant dyspnea on exertion. The patient's aide reports that last Monday she saw her PCP for burning in her feet and was given Lyrica samples. She takes 3 pills each night at bedtime. According to the med list she is also on gabapentin once daily. The patient has never had symptoms like this in the past. She does make some urine and has pain on urination. Her last dialysis was Saturday. She completed the entire dialysis session.  In the emergency department her head CT is negative for acute changes but her potassium is 6.6 and her corrected calcium is 11.4.  Review of Systems  Constitutional: Negative.   HENT: Negative.   Eyes: Negative.   Respiratory: Positive for shortness of breath.   Cardiovascular: Negative.   Gastrointestinal: Negative.   Genitourinary: Positive for dysuria.  Musculoskeletal: Negative.   Skin: Negative.   Neurological: Positive for dizziness, tremors, sensory change, speech change and focal weakness.  Endo/Heme/Allergies: Negative.    Psychiatric/Behavioral: Negative.      Past Medical History  Past Medical History  Diagnosis Date  . Abdominal abscess ([place]) 12-17-10    abdominal abscesses x2 ? one at this time  . Staph aureus infection November 2012   . Swelling of both ankles 12-17-10  . Anemia   . Coronary artery disease     Dr Kadakia-cardiologist 2-3 x year  . CHF (congestive heart failure) ([place])   . Dysrhythmia     irregular, "skips beats"  . Hypertension     sees Dr. Willey Blade  . Neuromuscular disorder ([place])     diabetic neuropathy  . Blind right eye     fell and crushed socket and eye fell out, was replaced at [facility]  . Heart murmur   . Myocardial infarction ([place]) 1970's or 1980's  . Exertional shortness of breath   . Inhalation injury     "worked at [employer]; can't inhale polyurethane or paint, etc" (08/23/2012)  . OSA on CPAP   . Type II diabetes mellitus ([place])     "over 10 years now" (08/23/2012)  . History of blood transfusion     "lots before starting dialysis" (08/23/2012)  .  H/O hiatal hernia   . Headache(784.0)     "weekly sometimes; since I fell and hit my head in 07/2011 08/23/2012)  . Eye drainage     "lots; since fall 07/2011" (08/23/2012)  . Arthritis     "all over" (08/23/2012)  . Chronic lower back pain   . Gout   . ESRD (end stage renal disease) on dialysis ([place])     "started 12/2011; [place]; TTS" (08/23/2012    Past Surgical History  Procedure Laterality Date  . Ventral hernia repair  2012-2013    component separation, repair with biologic; "had 3 surgeries to fix it" (08/23/2012)  . Cataract extraction w/ intraocular lens implant Bilateral 1990's  . Laparotomy  12/18/2010    Procedure: EXPLORATORY LAPAROTOMY;  Surgeon: Rolm Bookbinder, MD;  Location: [facility];  Service: General;  Laterality: N/A;  Abdominal Seroma Evacuation  . Back surgery    . Lumbar disc surgery  1970'-80's    X 3  . Wound debridement  03/20/2011    Procedure: DEBRIDEMENT ABDOMINAL WOUND;   Surgeon: Rolm Bookbinder, MD;  Location: [facility];  Service: General;  Laterality: N/A;  debridement abdominal wall, placement of wound vac  . Av fistula placement  09/30/2011    Procedure: ARTERIOVENOUS (AV) FISTULA CREATION;  Surgeon: Conrad Los Osos, MD;  Location: [facility];  Service: Vascular;  Laterality: Left;  BRACHIAL-CEPHALIC  . Bascilic vein transposition Left 11/10/2011    Left arm   . Insertion of dialysis catheter  01/05/2012    Procedure: INSERTION OF DIALYSIS CATHETER;  Surgeon: Conrad [place], MD;  Location: [facility];  Service: Vascular;  Laterality: N/A;  Right Internal Jugular Placement  . Bascilic vein transposition  01/28/2012    Procedure: BASCILIC VEIN TRANSPOSITION;  Surgeon: Conrad Bolivar, MD;  Location: [facility];  Service: Vascular;  Laterality: Left;  Second Stage   . Appendectomy    . Hernia repair    . Joint replacement    . Total knee arthroplasty Left 1980's  . Vaginal hysterectomy  1970's  . Dilation and curettage of uterus  1960's  . Intraocular prostheses insertion Right 2013    "fell; knocked my eye outl" (08/23/2012)  . Eye surgery    . Cardiac catheterization  1980's  . Left heart catheterization with coronary angiogram N/A 08/25/2012    Procedure: LEFT HEART CATHETERIZATION WITH CORONARY ANGIOGRAM;  Surgeon: Birdie Riddle, MD;  Location: [facility];  Service: Cardiovascular;  Laterality: N/A;      Social History Social History  Substance Use Topics  . Smoking status: Former Smoker -- 0.25 packs/day for 16 years    Types: Cigarettes    Quit date: 12/16/1980  . Smokeless tobacco: Never Used  . Alcohol Use: No  Lives alone.  Has an aide 2 hours a day 7 days a week.  Family History Family History  Problem Relation Age of Onset  . Hypertension Mother   . Cancer Brother     spine  . Cancer Sister     brain  . Hypertension Maternal Grandmother   . Anesthesia problems Neg Hx   . Hypertension Father     Prior to Admission medications   Medication Sig Start Date  End Date Taking? Authorizing Provider  acetaminophen (TYLENOL) 500 MG tablet Take 1,000 mg by mouth every 6 (six) hours as needed. For pain   Yes Historical Provider, MD  albuterol (PROVENTIL HFA;VENTOLIN HFA) 108 (90 BASE) MCG/ACT inhaler Inhale 2 puffs into the lungs every  4 (four) hours as needed for wheezing or shortness of breath.   Yes Historical Provider, MD  amLODipine (NORVASC) 10 MG tablet Take 10 mg by mouth daily.    Yes Historical Provider, MD  aspirin EC 81 MG tablet Take 81 mg by mouth daily.     Yes Historical Provider, MD  bisacodyl (DULCOLAX) 5 MG EC tablet Take 10 mg by mouth daily as needed. For constipation   Yes Historical Provider, MD  Calcium Carbonate-Vitamin D (CALCIUM 600 + D PO) Take 1 tablet by mouth daily.    Yes Historical Provider, MD  carvedilol (COREG) 25 MG tablet Take 25 mg by mouth 2 (two) times daily with a meal.   Yes Historical Provider, MD  gabapentin (NEURONTIN) 100 MG capsule Take 100 mg by mouth at bedtime. 11/23/14  Yes Historical Provider, MD  hydrALAZINE (APRESOLINE) 50 MG tablet Take 50 mg by mouth daily.    Yes Historical Provider, MD  HYDROcodone-acetaminophen (NORCO/VICODIN) 5-325 MG per tablet Take 1-2 tablets by mouth every 6 (six) hours as needed for pain.   Yes Historical Provider, MD  indomethacin (INDOCIN) 50 MG capsule Take 50 mg by mouth 2 (two) times daily as needed (for gout pain).   Yes Historical Provider, MD  insulin glargine (LANTUS) 100 UNIT/ML injection Inject 5-10 Units into the skin at bedtime. Patient uses sliding scale.   Yes Historical Provider, MD  isosorbide mononitrate (IMDUR) 60 MG 24 hr tablet Take 60 mg by mouth daily. 11/28/14  Yes Historical Provider, MD  metoprolol succinate (TOPROL-XL) 50 MG 24 hr tablet Take 50 mg by mouth daily. 11/22/14  Yes Historical Provider, MD  Multiple Vitamin (MULITIVITAMIN WITH MINERALS) TABS Take 1 tablet by mouth daily.   Yes Historical Provider, MD  potassium chloride SA (K-DUR,KLOR-CON) 20  MEQ tablet Take 20 mEq by mouth 2 (two) times daily. 11/26/14  Yes Historical Provider, MD  SENSIPAR 90 MG tablet Take 90 mg by mouth daily. 11/30/14  Yes Historical Provider, MD  sevelamer carbonate (RENVELA) 800 MG tablet Take 2,400 mg by mouth 3 (three) times daily with meals.   Yes Historical Provider, MD  nitroGLYCERIN (NITROSTAT) 0.4 MG SL tablet Place 0.4 mg under the tongue every 5 (five) minutes as needed for chest pain.    Historical Provider, MD    No Known Allergies  Physical Exam  Vitals  Blood pressure 108/61, pulse 64, temperature 97.5 F (36.4 C), temperature source Oral, resp. rate 16, height 5\' 5"  (1.651 m), weight 127.8 kg (281 lb 12 oz), SpO2 98 %.   General: Obese, elderly female lying in bed in NAD, daughter at bedside  Psych: Slightly slow to respond but Normal affect and insight, Not Suicidal or Homicidal, Awake Alert, Oriented X 3.  Neuro:   Decreased sensation on left sided extremities in comparison to right, uncontrolled twitching in her right upper extremity, symmetric smile, decreased ability for fine motor function.  ENT:  Right eye is artificial, left eye has pinpoint pupil, moist because membranes, no erythema or exudates in her oropharynx  Neck:  Supple, No lymphadenopathy appreciated  Respiratory:  Symmetrical chest wall movement, Good air movement bilaterally, CTAB.  Cardiac:  RRR, No Murmurs, no LE edema noted, no JVD.    Abdomen:  Positive bowel sounds, Soft, Non tender, Non distended,  No masses appreciated  Skin:  No Cyanosis, Normal Skin Turgor, No Skin Rash or Bruise.  Extremities:  Able to move all 4. 5/5 strength in each,  no effusions.  Data  Review  Wt Readings from Last 3 Encounters:  12/04/14 127.8 kg (281 lb 12 oz)  08/05/14 129.184 kg (284 lb 12.8 oz)  08/25/12 116.7 kg (257 lb 4.4 oz)    CBC  Recent Labs Lab 12/04/14 1222 12/04/14 1258  WBC 7.7  --   HGB 11.2* 13.3  HCT 36.1 39.0  PLT 179  --   MCV 96.3  --   MCH  29.9  --   MCHC 31.0  --   RDW 16.6*  --   LYMPHSABS 2.6  --   MONOABS 0.5  --   EOSABS 0.4  --   BASOSABS 0.1  --     Chemistries   Recent Labs Lab 12/04/14 1222 12/04/14 1258  NA 138 137  K 6.6* 6.2*  CL 100* 102  CO2 26  --   GLUCOSE 131* 125*  BUN 59* 57*  CREATININE 10.56* 9.90*  CALCIUM 10.8*  --   AST 23  --   ALT 15  --   ALKPHOS 41  --   BILITOT 0.6  --        Lab Results  Component Value Date   HGBA1C 6.0* 08/23/2012      Coagulation profile  Recent Labs Lab 12/04/14 1222  INR 1.16     Urinalysis    Component Value Date/Time   COLORURINE YELLOW 07/02/2010 2127   APPEARANCEUR CLOUDY* 07/02/2010 2127   LABSPEC 1.011 07/02/2010 2127   PHURINE 6.5 07/02/2010 2127   GLUCOSEU 250* 07/02/2010 2127   HGBUR SMALL* 07/02/2010 2127   BILIRUBINUR NEGATIVE 07/02/2010 2127   KETONESUR NEGATIVE 07/02/2010 2127   PROTEINUR >300* 07/02/2010 2127   UROBILINOGEN 0.2 07/02/2010 2127   NITRITE NEGATIVE 07/02/2010 2127   LEUKOCYTESUR LARGE* 07/02/2010 2127    Imaging results:   2D Echo (from 2010) Study Conclusions  1. Left ventricle: The cavity size was normal. There was mild  concentric hypertrophy. Systolic function was vigorous. The  estimated ejection fraction was in the range of 65% to 70%. Wall  motion was normal; there were no regional wall motion  abnormalities. 2. Mitral valve: Calcified annulus. 3. Left atrium: The atrium was mildly dilated.  Ct Head Wo Contrast  12/04/2014  CLINICAL DATA:  Slurred speech and dizziness for 1 day. EXAM: CT HEAD WITHOUT CONTRAST TECHNIQUE: Contiguous axial images were obtained from the base of the skull through the vertex without intravenous contrast. COMPARISON:  None. FINDINGS: Sinuses/Soft tissues: Right globe resection with presumed prosthesis. Clear paranasal sinuses and mastoid air cells. Intracranial: Moderate low density in the periventricular white matter likely related to small  vessel disease. Cerebral and cerebellar atrophy. The cerebellar atrophy is mildly age advanced. No mass lesion, hemorrhage, hydrocephalus, acute infarct, intra-axial, or extra-axial fluid collection. IMPRESSION: 1.  No acute intracranial abnormality. 2. Atrophy and small vessel ischemic change. Electronically Signed   By: Abigail Miyamoto M.D.   On: 12/04/2014 13:59    My personal review of EKG: Normal sinus rhythm, left axis deviation, normal QTC.   Assessment & Plan  Principal Problem:   Neurological deficit present Active Problems:   Hyperkalemia   Hypercalcemia   End stage renal disease (HCC)   Acute hyperkalemia   OSA on CPAP   Neurological deficits Uncertain etiology but believe it may be rate related to Lyrica toxicity. Patient was started on lyrica Monday and was taking it in addition to gabapentin. Deficits could also be related to hypercalcemia/hyperkalemia or possibly UTI.  Will check U/A.  Have requested  2D echo, Hgb A1C, Lipid Panel, Speech and PT evaluation. Have consulted nephrology for urgent dialysis. If her deficits persist after HD would consider further workup including possible MRI and neuro consultation (patient has an artificial eye and is claustrophobic)  End-stage renal disease Nephrology consulted for urgent dialysis this evening. Normally dialyzes on Tuesday/Thursday/Saturday.  Acute Hyperkalemia/hypercalcemia Should be remedied with dialysis.  Patient received a dose of Kayexalate and a nebulizer in the emergency department.  Diabetes mellitus with peripheral vascular disease Continue Lantus and add sliding scale insulin, check hemoglobin A1c.  Obstructive sleep apnea Patient is not on CPAP. Will order oxygen when necessary.  History of coronary artery disease and diastolic heart failure Sees Dr. Doylene Canard.  Echo results from 2010 are listed below. Will order repeat echo.   Consultants Called:    Nephrology  Family Communication:     Family at  bedside  Code Status:    DO NOT RESUSCITATE  Condition:  Guarded    Potential Disposition:   To home versus short-term rehabilitation in 48-72 hours.  Time spent in minutes : [place],  [place] on 12/04/2014 at 4:23 PM Between 7am to 7pm - Pager - [phone] After 7pm go to [email or web address] - password [email or web address] And look for the night coverage person covering me after hours

## 2014-12-04 NOTE — ED Notes (Addendum)
Per pt, she went to the gym this am and pt was feeling weak and "wobbly" at that time. Reports shaking and "spasms" to body yesterday. Family last saw pt normal Saturday. Reports pt had difficulty using fork yesterday and difficulty eating nugget this am. Pt very unsteady on her feet at triage, appears to lean towards right but grips are equal. Speech is slurred. ekg done at triage. Dialysis pt, last treatment was Saturday.

## 2014-12-04 NOTE — ED Notes (Signed)
Pt here today because she has started having shaking in all extremeties, does not normally shake or have tremors. Pt speech has some stuttering that is unusual per family.

## 2014-12-04 NOTE — ED Provider Notes (Signed)
CSN: [id number]     Arrival date & time 12/04/14  1152 History   First MD Initiated Contact with Patient 12/04/14 1437     Chief Complaint  Patient presents with  . Weakness     (Consider location/radiation/quality/duration/timing/severity/associated sxs/prior Treatment) HPI Patient has been feeling increasingly weak and wobbly for a couple of days. Her family notes that she seems to been walking more towards the right occasionally and her gait has been unsteady. The patient went to her silver sneakers class today, she reports she felt weak most of the class and had to sit down. She also notes that her right side of her body seemed to be trembling. She has not had fever cough or chest pain. Past Medical History  Diagnosis Date  . Abdominal abscess ([place]) 12-17-10    abdominal abscesses x2 ? one at this time  . Staph aureus infection November 2012   . Swelling of both ankles 12-17-10  . Anemia   . Coronary artery disease     Dr Kadakia-cardiologist 2-3 x year  . CHF (congestive heart failure) ([place])   . Dysrhythmia     irregular, "skips beats"  . Hypertension     sees Dr. Willey Blade  . Neuromuscular disorder ([place])     diabetic neuropathy  . Blind right eye     fell and crushed socket and eye fell out, was replaced at [facility]  . Heart murmur   . Myocardial infarction ([place]) 1970's or 1980's  . Exertional shortness of breath   . Inhalation injury     "worked at [employer]; can't inhale polyurethane or paint, etc" (08/23/2012)  . OSA on CPAP   . Type II diabetes mellitus ([place])     "over 10 years now" (08/23/2012)  . History of blood transfusion     "lots before starting dialysis" (08/23/2012)  . H/O hiatal hernia   . Headache(784.0)     "weekly sometimes; since I fell and hit my head in 07/2011 08/23/2012)  . Eye drainage     "lots; since fall 07/2011" (08/23/2012)  . Arthritis     "all over" (08/23/2012)  . Chronic lower back pain   . Gout   . ESRD (end stage renal disease) on  dialysis ([place])     "started 12/2011; [place]; TTS" (08/23/2012   Past Surgical History  Procedure Laterality Date  . Ventral hernia repair  2012-2013    component separation, repair with biologic; "had 3 surgeries to fix it" (08/23/2012)  . Cataract extraction w/ intraocular lens implant Bilateral 1990's  . Laparotomy  12/18/2010    Procedure: EXPLORATORY LAPAROTOMY;  Surgeon: Rolm Bookbinder, MD;  Location: [facility];  Service: General;  Laterality: N/A;  Abdominal Seroma Evacuation  . Back surgery    . Lumbar disc surgery  1970'-80's    X 3  . Wound debridement  03/20/2011    Procedure: DEBRIDEMENT ABDOMINAL WOUND;  Surgeon: Rolm Bookbinder, MD;  Location: [place];  Service: General;  Laterality: N/A;  debridement abdominal wall, placement of wound vac  . Av fistula placement  09/30/2011    Procedure: ARTERIOVENOUS (AV) FISTULA CREATION;  Surgeon: Conrad Nikolaevsk, MD;  Location: [place];  Service: Vascular;  Laterality: Left;  BRACHIAL-CEPHALIC  . Bascilic vein transposition Left 11/10/2011    Left arm   . Insertion of dialysis catheter  01/05/2012    Procedure: INSERTION OF DIALYSIS CATHETER;  Surgeon: Conrad [place], MD;  Location: [place];  Service: Vascular;  Laterality:  N/A;  Right Internal Jugular Placement  . Bascilic vein transposition  01/28/2012    Procedure: BASCILIC VEIN TRANSPOSITION;  Surgeon: Conrad [place], MD;  Location: [place];  Service: Vascular;  Laterality: Left;  Second Stage   . Appendectomy    . Hernia repair    . Joint replacement    . Total knee arthroplasty Left 1980's  . Vaginal hysterectomy  1970's  . Dilation and curettage of uterus  1960's  . Intraocular prostheses insertion Right 2013    "fell; knocked my eye outl" (08/23/2012)  . Eye surgery    . Cardiac catheterization  1980's  . Left heart catheterization with coronary angiogram N/A 08/25/2012    Procedure: LEFT HEART CATHETERIZATION WITH CORONARY ANGIOGRAM;  Surgeon: Birdie Riddle, MD;  Location: [place] [facility];   Service: Cardiovascular;  Laterality: N/A;   Family History  Problem Relation Age of Onset  . Hypertension Mother   . Cancer Brother     spine  . Cancer Sister     brain  . Hypertension Maternal Grandmother   . Anesthesia problems Neg Hx   . Hypertension Father    Social History  Substance Use Topics  . Smoking status: Former Smoker -- 0.25 packs/day for 16 years    Types: Cigarettes    Quit date: 12/16/1980  . Smokeless tobacco: Never Used  . Alcohol Use: No   OB History    No data available     Review of Systems 10 Systems reviewed and are negative for acute change except as noted in the HPI.    Allergies  Review of patient's allergies indicates no known allergies.  Home Medications   Prior to Admission medications   Medication Sig Start Date End Date Taking? Authorizing Provider  acetaminophen (TYLENOL) 500 MG tablet Take 1,000 mg by mouth every 6 (six) hours as needed. For pain   Yes Historical Provider, MD  albuterol (PROVENTIL HFA;VENTOLIN HFA) 108 (90 BASE) MCG/ACT inhaler Inhale 2 puffs into the lungs every 4 (four) hours as needed for wheezing or shortness of breath.   Yes Historical Provider, MD  amLODipine (NORVASC) 10 MG tablet Take 10 mg by mouth daily.    Yes Historical Provider, MD  aspirin EC 81 MG tablet Take 81 mg by mouth daily.     Yes Historical Provider, MD  bisacodyl (DULCOLAX) 5 MG EC tablet Take 10 mg by mouth daily as needed. For constipation   Yes Historical Provider, MD  Calcium Carbonate-Vitamin D (CALCIUM 600 + D PO) Take 1 tablet by mouth daily.    Yes Historical Provider, MD  carvedilol (COREG) 25 MG tablet Take 25 mg by mouth 2 (two) times daily with a meal.   Yes Historical Provider, MD  gabapentin (NEURONTIN) 100 MG capsule Take 100 mg by mouth at bedtime. 11/23/14  Yes Historical Provider, MD  hydrALAZINE (APRESOLINE) 50 MG tablet Take 50 mg by mouth daily.    Yes Historical Provider, MD  HYDROcodone-acetaminophen (NORCO/VICODIN) 5-325  MG per tablet Take 1-2 tablets by mouth every 6 (six) hours as needed for pain.   Yes Historical Provider, MD  indomethacin (INDOCIN) 50 MG capsule Take 50 mg by mouth 2 (two) times daily as needed (for gout pain).   Yes Historical Provider, MD  insulin glargine (LANTUS) 100 UNIT/ML injection Inject 5-10 Units into the skin at bedtime. Patient uses sliding scale.   Yes Historical Provider, MD  isosorbide mononitrate (IMDUR) 60 MG 24 hr tablet Take 60 mg by mouth  daily. 11/28/14  Yes Historical Provider, MD  metoprolol succinate (TOPROL-XL) 50 MG 24 hr tablet Take 50 mg by mouth daily. 11/22/14  Yes Historical Provider, MD  Multiple Vitamin (MULITIVITAMIN WITH MINERALS) TABS Take 1 tablet by mouth daily.   Yes Historical Provider, MD  potassium chloride SA (K-DUR,KLOR-CON) 20 MEQ tablet Take 20 mEq by mouth 2 (two) times daily. 11/26/14  Yes Historical Provider, MD  SENSIPAR 90 MG tablet Take 90 mg by mouth daily. 11/30/14  Yes Historical Provider, MD  sevelamer carbonate (RENVELA) 800 MG tablet Take 2,400 mg by mouth 3 (three) times daily with meals.   Yes Historical Provider, MD  nitroGLYCERIN (NITROSTAT) 0.4 MG SL tablet Place 0.4 mg under the tongue every 5 (five) minutes as needed for chest pain.    Historical Provider, MD   BP 108/61 mmHg  Pulse 64  Temp(Src) 97.5 F (36.4 C) (Oral)  Resp 16  Ht 5\' 5"  (1.651 m)  Wt 281 lb 12 oz (127.8 kg)  BMI 46.89 kg/m2  SpO2 98% Physical Exam  Constitutional:  Patient is morbidly obese. She is alert. No restaurant stress.  HENT:  Head: Normocephalic and atraumatic.  Nose: Nose normal.  Eyes: EOM are normal.  Patient has 1 prosthetic eye on the right. Left eye has normal ocular movement.  Neck: Neck supple.  Cardiovascular: Normal rate, regular rhythm, normal heart sounds and intact distal pulses.   Pulmonary/Chest: Effort normal and breath sounds normal.  Abdominal: Soft. Bowel sounds are normal. She exhibits no distension. There is no tenderness.   Musculoskeletal: She exhibits no tenderness.  Neurological: She is alert. She has normal strength. Coordination abnormal. GCS eye subscore is 4. GCS verbal subscore is 5. GCS motor subscore is 6.  Patient has less ability to elevate her right lower extremity off of the bed relative to the left. She does so there seems to be some tremor associated. She does have strong grip strength bilaterally in the right upper and left upper extremities. Again with activity however she is exhibiting some increased tremor on the right.  Skin: Skin is warm, dry and intact.  Psychiatric: She has a normal mood and affect.    ED Course  Procedures (including critical care time) Labs Review Labs Reviewed  CBC - Abnormal; Notable for the following:    RBC 3.75 (*)    Hemoglobin 11.2 (*)    RDW 16.6 (*)    All other components within normal limits  COMPREHENSIVE METABOLIC PANEL - Abnormal; Notable for the following:    Potassium 6.6 (*)    Chloride 100 (*)    Glucose, Bld 131 (*)    BUN 59 (*)    Creatinine, Ser 10.56 (*)    Calcium 10.8 (*)    GFR calc non Af Amer 3 (*)    GFR calc Af Amer 4 (*)    All other components within normal limits  CBG MONITORING, ED - Abnormal; Notable for the following:    Glucose-Capillary 130 (*)    All other components within normal limits  I-STAT CHEM 8, ED - Abnormal; Notable for the following:    Potassium 6.2 (*)    BUN 57 (*)    Creatinine, Ser 9.90 (*)    Glucose, Bld 125 (*)    All other components within normal limits  PROTIME-INR  APTT  DIFFERENTIAL  I-STAT TROPOININ, ED    Imaging Review Ct Head Wo Contrast  12/04/2014  CLINICAL DATA:  Slurred speech and dizziness for 1  day. EXAM: CT HEAD WITHOUT CONTRAST TECHNIQUE: Contiguous axial images were obtained from the base of the skull through the vertex without intravenous contrast. COMPARISON:  None. FINDINGS: Sinuses/Soft tissues: Right globe resection with presumed prosthesis. Clear paranasal sinuses and  mastoid air cells. Intracranial: Moderate low density in the periventricular white matter likely related to small vessel disease. Cerebral and cerebellar atrophy. The cerebellar atrophy is mildly age advanced. No mass lesion, hemorrhage, hydrocephalus, acute infarct, intra-axial, or extra-axial fluid collection. IMPRESSION: 1.  No acute intracranial abnormality. 2. Atrophy and small vessel ischemic change. Electronically Signed   By: Abigail Miyamoto M.D.   On: 12/04/2014 13:59   I have personally reviewed and evaluated these images and lab results as part of my medical decision-making.   EKG Interpretation   Date/Time:  Monday December 04 2014 12:10:42 EST Ventricular Rate:  70 PR Interval:  190 QRS Duration: 84 QT Interval:  400 QTC Calculation: 432 R Axis:   -31 Text Interpretation:  Normal sinus rhythm Left axis deviation Cannot rule  out Inferior infarct , age undetermined Abnormal ECG Confirmed by  Johnney Killian, MD, Jeannie Done [phone]) on 12/04/2014 4:37:51 PM      MDM   Final diagnoses:  Hyperkalemia  ESRD needing dialysis [facility])  Tremor  Focal neurological deficit   Patient presents with hyperkalemia on diagnostic evaluation. Treatment will be initiated and patient will be admitted for dialysis. She also has a complaint of weakness and tremor. The symptoms appear to have been occurring over approximately a 2 day. They became more pronounced today after the patient had gone to her silver sneakers class. She does have history of prior stroke. At this time the patient exhibits some increased tremor on the right relative to the left.    Charlesetta Shanks, MD 12/04/14 [phone]

## 2014-12-04 NOTE — Consult Note (Signed)
[facility] Renal Consultation Note    Indication for Consultation:  Management of ESRD/hemodialysis; anemia, hypertension/volume and secondary hyperparathyroidism PCP:  HPI: [name] is a 69 y.o. female with ESRD who has hemodialysis TTS at [facility]. 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 She is a former smoker, does not use illicit substances.  [name] saw her PCP this past Monday and was given samples of Lyrica for her diabetic neuropathy pain. Patient was already on Gabapentin 100 mg PO TID.  On Saturday, she developed uncontrolled twitching RUE. On Sunday, she developed LE weakness and unstable gait-per her daughter "was walking to one side". Today she went to her exercise group where she developed slurred speech, gait imbalances worsened and shortness of breath. She was brought to [facility] ED. CT of head without contrast showed No acute intracranial abnormality. Her K+ was 6.6 on arrival to ED. She has been on 1.0 K bath while on HD. She was given 30 grams oral Kayexalate. Repeat K+ 6.2.  Currently patient is awake, alert, in NAD. Still has some slurring of speech, slight tremors L hand. States she last took the Lyrica last PM. Denies fever, chills, N,V, states she has chronic diarrhea when upset. No syncope, no reported seizure activity. Patient denies trauma, falls.   Patient is compliant to hemodialysis prescription. Last in center lab values as follows: HGB 11.6 (11/30/2014) Ca 9.7 C Ca 10.0 Phos 4.8  PTH 297 (10/27/216). Patient takes renvela binders and sensipar for control of phos/PTH suppression.     Past Medical History  Diagnosis Date  . Abdominal abscess ([place]) 12-17-10    abdominal abscesses x2 ? one at this time  . Staph aureus infection November 2012   . Swelling of both ankles 12-17-10  .  Anemia   . Coronary artery disease     Dr Kadakia-cardiologist 2-3 x year  . CHF (congestive heart failure) ([place])   . Dysrhythmia     irregular, "skips beats"  . Hypertension     sees Dr. Willey Blade  . Neuromuscular disorder ([place])     diabetic neuropathy  . Blind right eye     fell and crushed socket and eye fell out, was replaced at [facility]  . Heart murmur   . Myocardial infarction ([place]) 1970's or 1980's  . Exertional shortness of breath   . Inhalation injury     "worked at [employer]; can't inhale polyurethane or paint, etc" (08/23/2012)  . OSA on CPAP   . Type II diabetes mellitus ([place])     "over 10 years now" (08/23/2012)  . History of blood transfusion     "lots before starting dialysis" (08/23/2012)  . H/O hiatal hernia   . Headache(784.0)     "weekly sometimes; since I fell and hit my head in 07/2011 08/23/2012)  . Eye drainage     "lots; since fall 07/2011" (08/23/2012)  . Arthritis     "all over" (08/23/2012)  . Chronic lower back pain   . Gout   . ESRD (end stage renal disease) on dialysis ([place])     "started 12/2011; [facility]; TTS" (08/23/2012   Past Surgical History  Procedure Laterality Date  . Ventral hernia repair  2012-2013    component separation, repair with biologic; "had 3 surgeries to fix it" (08/23/2012)  . Cataract extraction w/ intraocular lens implant  Bilateral 1990's  . Laparotomy  12/18/2010    Procedure: EXPLORATORY LAPAROTOMY;  Surgeon: Rolm Bookbinder, MD;  Location: [facility];  Service: General;  Laterality: N/A;  Abdominal Seroma Evacuation  . Back surgery    . Lumbar disc surgery  1970'-80's    X 3  . Wound debridement  03/20/2011    Procedure: DEBRIDEMENT ABDOMINAL WOUND;  Surgeon: Rolm Bookbinder, MD;  Location: [place];  Service: General;  Laterality: N/A;  debridement abdominal wall, placement of wound vac  . Av fistula placement  09/30/2011    Procedure: ARTERIOVENOUS (AV) FISTULA CREATION;  Surgeon: Conrad Bowers, MD;  Location: [place];   Service: Vascular;  Laterality: Left;  BRACHIAL-CEPHALIC  . Bascilic vein transposition Left 11/10/2011    Left arm   . Insertion of dialysis catheter  01/05/2012    Procedure: INSERTION OF DIALYSIS CATHETER;  Surgeon: Conrad Oglesby, MD;  Location: [place];  Service: Vascular;  Laterality: N/A;  Right Internal Jugular Placement  . Bascilic vein transposition  01/28/2012    Procedure: BASCILIC VEIN TRANSPOSITION;  Surgeon: Conrad Rentz, MD;  Location: [employer];  Service: Vascular;  Laterality: Left;  Second Stage   . Appendectomy    . Hernia repair    . Joint replacement    . Total knee arthroplasty Left 1980's  . Vaginal hysterectomy  1970's  . Dilation and curettage of uterus  1960's  . Intraocular prostheses insertion Right 2013    "fell; knocked my eye outl" (08/23/2012)  . Eye surgery    . Cardiac catheterization  1980's  . Left heart catheterization with coronary angiogram N/A 08/25/2012    Procedure: LEFT HEART CATHETERIZATION WITH CORONARY ANGIOGRAM;  Surgeon: Birdie Riddle, MD;  Location: [place] CATH LAB;  Service: Cardiovascular;  Laterality: N/A;   Family History  Problem Relation Age of Onset  . Hypertension Mother   . Cancer Brother     spine  . Cancer Sister     brain  . Hypertension Maternal Grandmother   . Anesthesia problems Neg Hx   . Hypertension Father    Social History:  reports that she quit smoking about 33 years ago. Her smoking use included Cigarettes. She has a 4 pack-year smoking history. She has never used smokeless tobacco. She reports that she does not drink alcohol or use illicit drugs. No Known Allergies Prior to Admission medications   Medication Sig Start Date End Date Taking? Authorizing Provider  acetaminophen (TYLENOL) 500 MG tablet Take 1,000 mg by mouth every 6 (six) hours as needed. For pain   Yes Historical Provider, MD  albuterol (PROVENTIL HFA;VENTOLIN HFA) 108 (90 BASE) MCG/ACT inhaler Inhale 2 puffs into the lungs every 4 (four) hours as needed for  wheezing or shortness of breath.   Yes Historical Provider, MD  amLODipine (NORVASC) 10 MG tablet Take 10 mg by mouth daily.    Yes Historical Provider, MD  aspirin EC 81 MG tablet Take 81 mg by mouth daily.     Yes Historical Provider, MD  bisacodyl (DULCOLAX) 5 MG EC tablet Take 10 mg by mouth daily as needed. For constipation   Yes Historical Provider, MD  Calcium Carbonate-Vitamin D (CALCIUM 600 + D PO) Take 1 tablet by mouth daily.    Yes Historical Provider, MD  carvedilol (COREG) 25 MG tablet Take 25 mg by mouth 2 (two) times daily with a meal.   Yes Historical Provider, MD  gabapentin (NEURONTIN) 100 MG capsule Take 100 mg by mouth  at bedtime. 11/23/14  Yes Historical Provider, MD  hydrALAZINE (APRESOLINE) 50 MG tablet Take 50 mg by mouth daily.    Yes Historical Provider, MD  HYDROcodone-acetaminophen (NORCO/VICODIN) 5-325 MG per tablet Take 1-2 tablets by mouth every 6 (six) hours as needed for pain.   Yes Historical Provider, MD  indomethacin (INDOCIN) 50 MG capsule Take 50 mg by mouth 2 (two) times daily as needed (for gout pain).   Yes Historical Provider, MD  insulin glargine (LANTUS) 100 UNIT/ML injection Inject 5-10 Units into the skin at bedtime. Patient uses sliding scale.   Yes Historical Provider, MD  isosorbide mononitrate (IMDUR) 60 MG 24 hr tablet Take 60 mg by mouth daily. 11/28/14  Yes Historical Provider, MD  metoprolol succinate (TOPROL-XL) 50 MG 24 hr tablet Take 50 mg by mouth daily. 11/22/14  Yes Historical Provider, MD  Multiple Vitamin (MULITIVITAMIN WITH MINERALS) TABS Take 1 tablet by mouth daily.   Yes Historical Provider, MD  potassium chloride SA (K-DUR,KLOR-CON) 20 MEQ tablet Take 20 mEq by mouth 2 (two) times daily. 11/26/14  Yes Historical Provider, MD  SENSIPAR 90 MG tablet Take 90 mg by mouth daily. 11/30/14  Yes Historical Provider, MD  sevelamer carbonate (RENVELA) 800 MG tablet Take 2,400 mg by mouth 3 (three) times daily with meals.   Yes Historical Provider,  MD  nitroGLYCERIN (NITROSTAT) 0.4 MG SL tablet Place 0.4 mg under the tongue every 5 (five) minutes as needed for chest pain.    Historical Provider, MD   Current Facility-Administered Medications  Medication Dose Route Frequency Provider Last Rate Last Dose  . ipratropium-albuterol (DUONEB) 0.5-2.5 (3) MG/3ML nebulizer solution 3 mL  3 mL Nebulization Q4H Charlesetta Shanks, MD   3 mL at 12/04/14 1628  . sodium polystyrene (KAYEXALATE) 15 GM/60ML suspension 30 g  30 g Oral Once Charlesetta Shanks, MD       Current Outpatient Prescriptions  Medication Sig Dispense Refill  . acetaminophen (TYLENOL) 500 MG tablet Take 1,000 mg by mouth every 6 (six) hours as needed. For pain    . albuterol (PROVENTIL HFA;VENTOLIN HFA) 108 (90 BASE) MCG/ACT inhaler Inhale 2 puffs into the lungs every 4 (four) hours as needed for wheezing or shortness of breath.    Marland Kitchen amLODipine (NORVASC) 10 MG tablet Take 10 mg by mouth daily.     Marland Kitchen aspirin EC 81 MG tablet Take 81 mg by mouth daily.      . bisacodyl (DULCOLAX) 5 MG EC tablet Take 10 mg by mouth daily as needed. For constipation    . Calcium Carbonate-Vitamin D (CALCIUM 600 + D PO) Take 1 tablet by mouth daily.     . carvedilol (COREG) 25 MG tablet Take 25 mg by mouth 2 (two) times daily with a meal.    . gabapentin (NEURONTIN) 100 MG capsule Take 100 mg by mouth at bedtime.  2  . hydrALAZINE (APRESOLINE) 50 MG tablet Take 50 mg by mouth daily.     Marland Kitchen HYDROcodone-acetaminophen (NORCO/VICODIN) 5-325 MG per tablet Take 1-2 tablets by mouth every 6 (six) hours as needed for pain.    . indomethacin (INDOCIN) 50 MG capsule Take 50 mg by mouth 2 (two) times daily as needed (for gout pain).    . insulin glargine (LANTUS) 100 UNIT/ML injection Inject 5-10 Units into the skin at bedtime. Patient uses sliding scale.    . isosorbide mononitrate (IMDUR) 60 MG 24 hr tablet Take 60 mg by mouth daily.    . metoprolol succinate (TOPROL-XL)  50 MG 24 hr tablet Take 50 mg by mouth daily.  2   . Multiple Vitamin (MULITIVITAMIN WITH MINERALS) TABS Take 1 tablet by mouth daily.    . potassium chloride SA (K-DUR,KLOR-CON) 20 MEQ tablet Take 20 mEq by mouth 2 (two) times daily.    . SENSIPAR 90 MG tablet Take 90 mg by mouth daily.  4  . sevelamer carbonate (RENVELA) 800 MG tablet Take 2,400 mg by mouth 3 (three) times daily with meals.    . nitroGLYCERIN (NITROSTAT) 0.4 MG SL tablet Place 0.4 mg under the tongue every 5 (five) minutes as needed for chest pain.     Labs: Basic Metabolic Panel:  Recent Labs Lab 12/04/14 1222 12/04/14 1258  NA 138 137  K 6.6* 6.2*  CL 100* 102  CO2 26  --   GLUCOSE 131* 125*  BUN 59* 57*  CREATININE 10.56* 9.90*  CALCIUM 10.8*  --    Liver Function Tests:  Recent Labs Lab 12/04/14 1222  AST 23  ALT 15  ALKPHOS 41  BILITOT 0.6  PROT 7.5  ALBUMIN 3.5   No results for input(s): LIPASE, AMYLASE in the last 168 hours. No results for input(s): AMMONIA in the last 168 hours. CBC:  Recent Labs Lab 12/04/14 1222 12/04/14 1258  WBC 7.7  --   NEUTROABS 4.2  --   HGB 11.2* 13.3  HCT 36.1 39.0  MCV 96.3  --   PLT 179  --    Cardiac Enzymes: No results for input(s): CKTOTAL, CKMB, CKMBINDEX, TROPONINI in the last 168 hours. CBG:  Recent Labs Lab 12/04/14 1212  GLUCAP 130*   Iron Studies: No results for input(s): IRON, TIBC, TRANSFERRIN, FERRITIN in the last 72 hours. Studies/Results: Ct Head Wo Contrast  12/04/2014  CLINICAL DATA:  Slurred speech and dizziness for 1 day. EXAM: CT HEAD WITHOUT CONTRAST TECHNIQUE: Contiguous axial images were obtained from the base of the skull through the vertex without intravenous contrast. COMPARISON:  None. FINDINGS: Sinuses/Soft tissues: Right globe resection with presumed prosthesis. Clear paranasal sinuses and mastoid air cells. Intracranial: Moderate low density in the periventricular white matter likely related to small vessel disease. Cerebral and cerebellar atrophy. The cerebellar  atrophy is mildly age advanced. No mass lesion, hemorrhage, hydrocephalus, acute infarct, intra-axial, or extra-axial fluid collection. IMPRESSION: 1.  No acute intracranial abnormality. 2. Atrophy and small vessel ischemic change. Electronically Signed   By: Abigail Miyamoto M.D.   On: 12/04/2014 13:59    ROS: As per HPI otherwise negative.  Review of Systems:  Physical Exam: Filed Vitals:   12/04/14 1216 12/04/14 1402 12/04/14 1415 12/04/14 1613  BP: 93/58 106/58 108/61   Pulse: 67 63 64   Temp: 97.5 F (36.4 C)   97.5 F (36.4 C)  TempSrc: Oral     Resp: 16 17 16    Height: 5\' 5"  (1.651 m)     Weight: 127.8 kg (281 lb 12 oz)     SpO2: 97% 99% 98%      General: Well developed, well nourished, in no acute distress. Head: Normocephalic, atraumatic, sclera non-icteric, mucus membranes are moist. Had traumatic eye injury, R Eye is artificial.  Neck: Supple. JVD not elevated. Lungs: Clear bilaterally to auscultation without wheezes, rales, or rhonchi. Breathing is unlabored. Heart: RRR with S1 S2. No murmurs, rubs, or gallops appreciated. Abdomen: Soft, non-tender, non-distended with normoactive bowel sounds. No rebound/guarding. No obvious abdominal masses. M-S:  L. Side grips and movement slightly weaker than R. Has  tremors LUE but no asterixis.  Lower extremities:without edema or ischemic changes, no open wounds  Neuro: Alert and oriented X 3. Moves all extremities spontaneously. Psych:  Responds to questions appropriately with a normal affect. Dialysis Access: LUA AVF + thrill + Bruit  Dialysis Orders: Center: [facility]  on TTW . EDW 127.5 kg HD Bath 1.0 K 2.25 Ca  Time 4 hours 15 minutes Heparin 13000 units per treatment. Access LUA AVF BFR 400 DFR 800    Calcitriol 2.5 mg PO Q TTS (Last dose 11/12/216)  Assessment/Plan: 1.  Altered mental status - TIA VS Polypharmacy: Suspect the latter. Per primary.  2. Hyperkalemia:  Will had HD tonight on 1.0 K 2.25 Ca  bath. Will DC oral potassium supplement. Asked pt to tell nephrologist about any new medications she is given by other providers.  3.  ESRD - TTS at [facility] 4.  Hypertension/volume  - BP controlled. Is on Metoprolol succ 50 mg PO daily, Coreg 25 mg PO BID, Hydralazine 50 mg PO daily, isosorbide mononitrate 60 mg PO daily. Left last Saturday at EDW. Does occassionally have high IDWG.  5.  Anemia  - Hgb 13.3. Not on ESA. Follow CBC.  6.  Metabolic bone disease -  Continue renvela and sensipar per OP. C Ca 11.2. Hold Calcitriol tonight. Reduce dose to 1.75 mcg on DC.  7.  Nutrition - NPO at present.    Rita H. Owens Shark, NP-C 12/04/2014, 5:08 PM  [place] [phone]  Pt seen, examined and agree w A/P as above.  Kelly Splinter MD [employer] pager [phone]    cell [phone] 12/05/2014, 8:19 AM

## 2014-12-05 ENCOUNTER — Inpatient Hospital Stay (HOSPITAL_COMMUNITY): Payer: Commercial Managed Care - HMO

## 2014-12-05 DIAGNOSIS — R0600 Dyspnea, unspecified: Secondary | ICD-10-CM

## 2014-12-05 LAB — LIPID PANEL
CHOLESTEROL: 68 mg/dL (ref 0–200)
HDL: 32 mg/dL — ABNORMAL LOW (ref >40)
LDL Cholesterol: NEGATIVE mg/dL (ref 0–99)
TRIGLYCERIDES: 208 mg/dL — AB (ref <150)
Total CHOL/HDL Ratio: 2.1 RATIO
VLDL: 42 mg/dL — AB (ref 0–40)

## 2014-12-05 LAB — COMPREHENSIVE METABOLIC PANEL
ALK PHOS: 37 U/L — AB (ref 38–126)
ALT: 12 U/L — ABNORMAL LOW (ref 14–54)
ANION GAP: 10 (ref 5–15)
AST: 28 U/L (ref 15–41)
Albumin: 2.9 g/dL — ABNORMAL LOW (ref 3.5–5.0)
BUN: 27 mg/dL — ABNORMAL HIGH (ref 6–20)
CALCIUM: 9.1 mg/dL (ref 8.9–10.3)
CO2: 30 mmol/L (ref 22–32)
Chloride: 97 mmol/L — ABNORMAL LOW (ref 101–111)
Creatinine, Ser: 6.56 mg/dL — ABNORMAL HIGH (ref 0.44–1.00)
GFR, EST AFRICAN AMERICAN: 7 mL/min — AB (ref >60)
GFR, EST NON AFRICAN AMERICAN: 6 mL/min — AB (ref >60)
Glucose, Bld: 135 mg/dL — ABNORMAL HIGH (ref 65–99)
Potassium: 4.5 mmol/L (ref 3.5–5.1)
SODIUM: 137 mmol/L (ref 135–145)
TOTAL PROTEIN: 6.1 g/dL — AB (ref 6.5–8.1)
Total Bilirubin: 0.1 mg/dL — ABNORMAL LOW (ref 0.3–1.2)

## 2014-12-05 LAB — GLUCOSE, CAPILLARY
GLUCOSE-CAPILLARY: 155 mg/dL — AB (ref 65–99)
GLUCOSE-CAPILLARY: 110 mg/dL — AB (ref 65–99)
GLUCOSE-CAPILLARY: 121 mg/dL — AB (ref 65–99)
Glucose-Capillary: 92 mg/dL (ref 65–99)

## 2014-12-05 LAB — MRSA PCR SCREENING: MRSA by PCR: NEGATIVE

## 2014-12-05 MED ORDER — ACETAMINOPHEN 325 MG PO TABS: 650.0000 mg | ORAL_TABLET | Freq: Four times a day (QID) | ORAL | Status: DC | PRN

## 2014-12-05 MED ORDER — HYDROCODONE-ACETAMINOPHEN 5-325 MG PO TABS: 1.0000 | ORAL_TABLET | Freq: Four times a day (QID) | ORAL | Status: DC | PRN

## 2014-12-05 MED ORDER — HEPARIN SODIUM (PORCINE) 5000 UNIT/ML IJ SOLN
5000.0000 [IU] | Freq: Three times a day (TID) | INTRAMUSCULAR | Status: DC
  Administered 2014-12-05 – 2014-12-07 (×7): 5000 [IU] via SUBCUTANEOUS
  Filled 2014-12-05 (×7): qty 1

## 2014-12-05 MED ORDER — WHITE PETROLATUM GEL
Status: AC
  Administered 2014-12-05: 03:00:00
  Filled 2014-12-05: qty 1

## 2014-12-05 MED ORDER — AMLODIPINE BESYLATE 10 MG PO TABS: 10.0000 mg | ORAL_TABLET | Freq: Every day | ORAL | Status: DC

## 2014-12-05 MED ORDER — CINACALCET HCL 30 MG PO TABS
90.0000 mg | ORAL_TABLET | Freq: Every day | ORAL | Status: DC
  Administered 2014-12-05 – 2014-12-06 (×2): 90 mg via ORAL
  Filled 2014-12-05 (×3): qty 3

## 2014-12-05 MED ORDER — HYDRALAZINE HCL 50 MG PO TABS: 50.0000 mg | ORAL_TABLET | Freq: Every day | ORAL | Status: DC

## 2014-12-05 MED ORDER — INDOMETHACIN 50 MG PO CAPS
50.0000 mg | ORAL_CAPSULE | Freq: Two times a day (BID) | ORAL | Status: DC | PRN
  Filled 2014-12-05: qty 1

## 2014-12-05 MED ORDER — INSULIN ASPART 100 UNIT/ML ~~LOC~~ SOLN
0.0000 [IU] | Freq: Three times a day (TID) | SUBCUTANEOUS | Status: DC
  Administered 2014-12-06: 2 [IU] via SUBCUTANEOUS
  Administered 2014-12-06: 1 [IU] via SUBCUTANEOUS

## 2014-12-05 MED ORDER — BISACODYL 5 MG PO TBEC: 10.0000 mg | DELAYED_RELEASE_TABLET | Freq: Every day | ORAL | Status: DC | PRN

## 2014-12-05 MED ORDER — INSULIN GLARGINE 100 UNIT/ML ~~LOC~~ SOLN
5.0000 [IU] | Freq: Every day | SUBCUTANEOUS | Status: DC
  Administered 2014-12-05: 5 [IU] via SUBCUTANEOUS
  Filled 2014-12-05 (×2): qty 0.05

## 2014-12-05 MED ORDER — SEVELAMER CARBONATE 800 MG PO TABS
2400.0000 mg | ORAL_TABLET | Freq: Three times a day (TID) | ORAL | Status: DC
Start: 2014-12-05 — End: 2014-12-07
  Administered 2014-12-05 – 2014-12-07 (×6): 2400 mg via ORAL
  Filled 2014-12-05 (×7): qty 3

## 2014-12-05 MED ORDER — NITROGLYCERIN 0.4 MG SL SUBL: 0.4000 mg | SUBLINGUAL_TABLET | SUBLINGUAL | Status: DC | PRN

## 2014-12-05 MED ORDER — METOPROLOL SUCCINATE ER 50 MG PO TB24: 50.0000 mg | ORAL_TABLET | Freq: Every day | ORAL | Status: DC

## 2014-12-05 MED ORDER — SODIUM CHLORIDE 0.9 % IJ SOLN: 3.0000 mL | INTRAMUSCULAR | Status: DC | PRN

## 2014-12-05 MED ORDER — IPRATROPIUM-ALBUTEROL 0.5-2.5 (3) MG/3ML IN SOLN
3.0000 mL | Freq: Three times a day (TID) | RESPIRATORY_TRACT | Status: DC
  Administered 2014-12-05 – 2014-12-07 (×6): 3 mL via RESPIRATORY_TRACT
  Filled 2014-12-05 (×8): qty 3

## 2014-12-05 MED ORDER — ADULT MULTIVITAMIN W/MINERALS CH: 1.0000 | ORAL_TABLET | Freq: Every day | ORAL | Status: DC

## 2014-12-05 MED ORDER — SODIUM CHLORIDE 0.9 % IJ SOLN
3.0000 mL | Freq: Two times a day (BID) | INTRAMUSCULAR | Status: DC
  Administered 2014-12-05 – 2014-12-07 (×4): 3 mL via INTRAVENOUS

## 2014-12-05 MED ORDER — ADULT MULTIVITAMIN W/MINERALS CH
1.0000 | ORAL_TABLET | Freq: Every day | ORAL | Status: DC
  Administered 2014-12-05 – 2014-12-06 (×3): 1 via ORAL
  Filled 2014-12-05 (×3): qty 1

## 2014-12-05 MED ORDER — ISOSORBIDE MONONITRATE ER 60 MG PO TB24
60.0000 mg | ORAL_TABLET | Freq: Every day | ORAL | Status: DC
  Administered 2014-12-05: 60 mg via ORAL
  Filled 2014-12-05: qty 1

## 2014-12-05 MED ORDER — CARVEDILOL 25 MG PO TABS: 25.0000 mg | ORAL_TABLET | Freq: Two times a day (BID) | ORAL | Status: DC

## 2014-12-05 MED ORDER — ASPIRIN EC 81 MG PO TBEC
81.0000 mg | DELAYED_RELEASE_TABLET | Freq: Every day | ORAL | Status: DC
Start: 2014-12-05 — End: 2014-12-07
  Administered 2014-12-05 – 2014-12-07 (×3): 81 mg via ORAL
  Filled 2014-12-05 (×3): qty 1

## 2014-12-05 MED ORDER — SODIUM CHLORIDE 0.9 % IJ SOLN
3.0000 mL | Freq: Two times a day (BID) | INTRAMUSCULAR | Status: DC
  Administered 2014-12-05 – 2014-12-07 (×4): 3 mL via INTRAVENOUS

## 2014-12-05 MED ORDER — SODIUM CHLORIDE 0.9 % IV SOLN: 250.0000 mL | INTRAVENOUS | Status: DC | PRN

## 2014-12-05 MED ORDER — SODIUM CHLORIDE 0.9 % IV BOLUS (SEPSIS): 250.0000 mL | Freq: Once | INTRAVENOUS | Status: DC

## 2014-12-05 MED ORDER — ALBUTEROL SULFATE (2.5 MG/3ML) 0.083% IN NEBU: 3.0000 mL | INHALATION_SOLUTION | RESPIRATORY_TRACT | Status: DC | PRN

## 2014-12-05 NOTE — Progress Notes (Signed)
In and out cath order discontinued.

## 2014-12-05 NOTE — Progress Notes (Signed)
[name] Progress Note  Assessment/Plan: 1. Altered mental status - TIA VS Polypharmacy: Suspect the latter. Per primary. Awake, alert today, decreased L tremors.  2. Hyperkalemia: HD last night on 1.0 K bath. K+ 4.5 today.  3. ESRD - TTS at [facility]. Will have short HD today to get back on schedule. 4. Hypertension/volume - Patient having episodes of hypotension. Only antihypertensive med ordered is Imdur. Home BP meds probably need adjusting. Had HD last night Net UF 97. UFG impended by low BP. Will attempt UFG 1 today. Evaluate EDW on discharge. 5. Anemia - Hgb 11.0 Not on ESA. Follow CBC.  6. Metabolic bone disease - Continue renvela and sensipar per OP. C Ca 11.2. Hold Calcitriol tonight. Reduce dose to 1.75 mcg on DC.  7. Nutrition - Renal diet with fld restrictions. Add vit/nepro. Albumin 2.9 8. DM: per Primary S/S  Jimmye Norman. Brown NP-C 12/05/2014, 9:15 AM  [facility] [phone]  Pt seen, examined and agree w A/P as above. More alert today, still some jerking of the UE's.  Probably this is medication-related issue from combination of Neurontin and Lyrica taken together.   Kelly Splinter MD [facility] pager [phone]    cell [phone] 12/05/2014, 12:45 PM     Subjective:  "I'm going OK". States she stayed in ED until 0100 due to low BP. No C/Os at present. Tremors appear to be improved. Speech clearer.   Objective Filed Vitals:   12/05/14 0102 12/05/14 0431 12/05/14 0603 12/05/14 0909  BP: 70/40  95/48 90/20  Pulse: 68 84 78 78  Temp: 98.2 F (36.8 C)  98.7 F (37.1 C) 97.6 F (36.4 C)  TempSrc: Oral  Oral Oral  Resp: 16 18 17 16   Height: 5\' 5"  (1.651 m)     Weight: 130.7 kg (288 lb 2.3 oz)     SpO2: 100% 97% 96% 96%   Physical Exam General: Well nourished, pleasant, NAD.  Heart:RRR with S1 S2. No murmurs, rubs, or gallops appreciated. Lungs: Bilateral breath sounds CTA AP  Abdomen: soft, active  BS Extremities: Ne LE edema Dialysis Access: LUA AVF + thrill + Bruit  Dialysis Orders: Center: [facility] on TTW . EDW 127.5 kg HD Bath 1.0 K 2.25 Ca Time 4 hours 15 minutes Heparin 13000 units per treatment. Access LUA AVF BFR 400 DFR 800  Calcitriol 2.5 mg PO Q TTS (Last dose 11/12/216) Additional Objective Labs: Basic Metabolic Panel:  Recent Labs Lab 12/04/14 1222 12/04/14 1258 12/04/14 2045 12/04/14 2153 12/05/14 0544  NA 138 137 138  --  137  K 6.6* 6.2* 7.2* 3.8 4.5  CL 100* 102 100*  --  97*  CO2 26  --  23  --  30  GLUCOSE 131* 125* 72  --  135*  BUN 59* 57* 64*  --  27*  CREATININE 10.56* 9.90* 10.89*  --  6.56*  CALCIUM 10.8*  --  10.1  --  9.1  PHOS  --   --  4.7*  --   --    Liver Function Tests:  Recent Labs Lab 12/04/14 1222 12/04/14 2045 12/05/14 0544  AST 23  --  28  ALT 15  --  12*  ALKPHOS 41  --  37*  BILITOT 0.6  --  0.1*  PROT 7.5  --  6.1*  ALBUMIN 3.5 3.3* 2.9*   No results for input(s): LIPASE, AMYLASE in the last 168 hours. CBC:  Recent Labs Lab 12/04/14  1222 12/04/14 1258 12/04/14 2153  WBC 7.7  --  8.6  NEUTROABS 4.2  --   --   HGB 11.2* 13.3 11.0*  HCT 36.1 39.0 34.6*  MCV 96.3  --  94.5  PLT 179  --  178   Blood Culture    Component Value Date/Time   SDES BLOOD RIGHT WRIST 05/07/2012 0545   SPECREQUEST BOTTLES DRAWN AEROBIC AND ANAEROBIC 10CC 05/07/2012 0545   CULT NO GROWTH 5 DAYS 05/07/2012 0545   REPTSTATUS 05/13/2012 FINAL 05/07/2012 0545    Cardiac Enzymes: No results for input(s): CKTOTAL, CKMB, CKMBINDEX, TROPONINI in the last 168 hours. CBG:  Recent Labs Lab 12/04/14 1212 12/05/14 0108 12/05/14 0743  GLUCAP 130* 155* 110*   Iron Studies: No results for input(s): IRON, TIBC, TRANSFERRIN, FERRITIN in the last 72 hours. @lablastinr3 @ Studies/Results: Ct Head Wo Contrast  12/04/2014  CLINICAL DATA:  Slurred speech and dizziness for 1 day. EXAM: CT HEAD WITHOUT CONTRAST  TECHNIQUE: Contiguous axial images were obtained from the base of the skull through the vertex without intravenous contrast. COMPARISON:  None. FINDINGS: Sinuses/Soft tissues: Right globe resection with presumed prosthesis. Clear paranasal sinuses and mastoid air cells. Intracranial: Moderate low density in the periventricular white matter likely related to small vessel disease. Cerebral and cerebellar atrophy. The cerebellar atrophy is mildly age advanced. No mass lesion, hemorrhage, hydrocephalus, acute infarct, intra-axial, or extra-axial fluid collection. IMPRESSION: 1.  No acute intracranial abnormality. 2. Atrophy and small vessel ischemic change. Electronically Signed   By: Abigail Miyamoto M.D.   On: 12/04/2014 13:59   Medications:   . aspirin EC  81 mg Oral Daily  . cinacalcet  90 mg Oral Q breakfast  . heparin  5,000 Units Subcutaneous 3 times per day  . insulin aspart  0-9 Units Subcutaneous TID WC  . insulin glargine  5 Units Subcutaneous QHS  . ipratropium-albuterol  3 mL Nebulization TID  . isosorbide mononitrate  60 mg Oral Daily  . multivitamin with minerals  1 tablet Oral QHS  . sevelamer carbonate  2,400 mg Oral TID WC  . sodium chloride  3 mL Intravenous Q12H  . sodium chloride  3 mL Intravenous Q12H

## 2014-12-05 NOTE — Evaluation (Signed)
Physical Therapy Evaluation Patient Details Name: [name] MRN: [id number] DOB: Dec 09, 1945 Today's Date: 12/05/2014   History of Present Illness  [name] is a 69 y.o. female, with coronary artery disease, CHF, diabetes with neuropathy, in stage renal disease (dialyzes TTS) who presents to the emergency department today with neurological deficits. According to her family the patient developed uncontrollable twitching in her right arm on Saturday. On Sunday she developed weakness in her legs and her daughter noticed she was leaning to one side. [facility] [employer] and developed [facility] speech. She is complaining of unbalanced gait, and significant dyspnea on exertion. Awaiting stroke work up.  Clinical Impression  Pt presenting with R sided facial drop, R sided weakness and impaired sensation. Pt was completely independent, attending [employer] PTA. Pt to greatly benefit from CIR upon d/c for maximal functional recovery.    Follow Up Recommendations CIR;Supervision/Assistance - 24 hour    Equipment Recommendations   (TBD)    Recommendations for Other Services Rehab consult     Precautions / Restrictions Precautions Precautions: Fall Restrictions Weight Bearing Restrictions: No      Mobility  Bed Mobility Overal bed mobility: Needs Assistance Bed Mobility: Supine to Sit     Supine to sit: Min assist     General bed mobility comments: HOB elevated, labored effort, pulled self up with hand rail, able to move LEs off EOB  Transfers Overall transfer level: Needs assistance Equipment used:  (2 person lift with gait belt) Transfers: Sit to/from Stand Sit to Stand: Mod assist;+2 physical assistance         General transfer comment: R knee buckling and shaking requiring PT to block knee, completed x2  Ambulation/Gait             General Gait Details: limited to sidestepping x 3 to [facility], R knee buckling requiring it to be  blocked  Science writer    Modified Rankin (Stroke Patients Only) Modified Rankin (Stroke Patients Only) Pre-Morbid Rankin Score: No symptoms Modified Rankin: Moderately severe disability     Balance Overall balance assessment: Needs assistance Sitting-balance support: Feet supported;No upper extremity supported Sitting balance-Leahy Scale: Good     Standing balance support: Bilateral upper extremity supported Standing balance-Leahy Scale: Poor Standing balance comment: requires extenral support due to R knee buckling                             Pertinent Vitals/Pain Pain Assessment: No/denies pain    Home Living Family/patient expects to be discharged to:: Private residence Living Arrangements: Alone Available Help at Discharge: Family;Available PRN/intermittently Type of Home: House Home Access: Stairs to enter Entrance Stairs-Rails: Can reach both Entrance Stairs-Number of Steps: 3 Home Layout: One level        Prior Function Level of Independence: Independent         Comments: pt was attending [employer] and was able to drive and grocery shop     Hand Dominance   Dominant Hand: Right    Extremity/Trunk Assessment   Upper Extremity Assessment: RUE deficits/detail RUE Deficits / Details: grossly 3-/5, impaired sensation   RUE Sensation: decreased light touch     Lower Extremity Assessment: RLE deficits/detail RLE Deficits / Details: grossly 3-/5, impaired sensation    Cervical / Trunk Assessment: Normal  Communication   Communication: Expressive difficulties  Cognition  Arousal/Alertness: Awake/alert Behavior During Therapy:  (sleepy but awake) Overall Cognitive Status: Within Functional Limits for tasks assessed                      General Comments      Exercises        Assessment/Plan    PT Assessment Patient needs continued PT services  PT Diagnosis Difficulty walking   PT  Problem List Decreased strength;Decreased activity tolerance;Decreased balance;Decreased mobility;Decreased coordination  PT Treatment Interventions DME instruction;Gait training;Stair training;Functional mobility training;Therapeutic activities;Therapeutic exercise;Balance training;Neuromuscular re-education   PT Goals (Current goals can be found in the Care Plan section) Acute Rehab PT Goals Patient Stated Goal: home to cook thanksgiving dinner PT Goal Formulation: With patient Time For Goal Achievement: 12/19/14 Potential to Achieve Goals: Good    Frequency Min 4X/week   Barriers to discharge Decreased caregiver support lives alone but family reports someone can be there 24/7    Co-evaluation               End of Session Equipment Utilized During Treatment: Gait belt Activity Tolerance: Patient tolerated treatment well Patient left: in bed;with call bell/phone within reach;with bed alarm set Nurse Communication: Mobility status         Time: 1450-1515 PT Time Calculation (min) (ACUTE ONLY): 25 min   Charges:   PT Evaluation $Initial PT Evaluation Tier I: 1 Procedure PT Treatments $Therapeutic Activity: 8-22 mins   PT G CodesKingsley Callander 12/05/2014, 4:15 PM   Kittie Plater, PT, DPT Pager #: [phone] Office #: [phone]

## 2014-12-05 NOTE — Evaluation (Signed)
Clinical/Bedside Swallow Evaluation Patient Details  Name: [name] MRN: [id number] Date of Birth: 1946/01/15  Today's Date: 12/05/2014 Time: SLP Start Time (ACUTE ONLY): 0801 SLP Stop Time (ACUTE ONLY): 0814 SLP Time Calculation (min) (ACUTE ONLY): 13 min  Past Medical History:  Past Medical History  Diagnosis Date  . Abdominal abscess ([place]) 12-17-10    abdominal abscesses x2 ? one at this time  . Staph aureus infection November 2012   . Swelling of both ankles 12-17-10  . Anemia   . Coronary artery disease     Dr Kadakia-cardiologist 2-3 x year  . CHF (congestive heart failure) ([place])   . Dysrhythmia     irregular, "skips beats"  . Hypertension     sees Dr. Willey Blade  . Neuromuscular disorder ([place])     diabetic neuropathy  . Blind right eye     fell and crushed socket and eye fell out, was replaced [facility]  . Heart murmur   . Myocardial infarction ([place]) 1970's or 1980's  . Exertional shortness of breath   . Inhalation injury     "worked at [employer]; can't inhale polyurethane or paint, etc" (08/23/2012)  . OSA on CPAP   . Type II diabetes mellitus ([place])     "over 10 years now" (08/23/2012)  . History of blood transfusion     "lots before starting dialysis" (08/23/2012)  . H/O hiatal hernia   . Headache(784.0)     "weekly sometimes; since I fell and hit my head in 07/2011 08/23/2012)  . Eye drainage     "lots; since fall 07/2011" (08/23/2012)  . Arthritis     "all over" (08/23/2012)  . Chronic lower back pain   . Gout   . ESRD (end stage renal disease) on dialysis [facility])     "started 12/2011; [place]; TTS" (08/23/2012   Past Surgical History:  Past Surgical History  Procedure Laterality Date  . Ventral hernia repair  2012-2013    component separation, repair with biologic; "had 3 surgeries to fix it" (08/23/2012)  . Cataract extraction w/ intraocular lens implant Bilateral 1990's  . Laparotomy  12/18/2010    Procedure: EXPLORATORY LAPAROTOMY;  Surgeon:  Rolm Bookbinder, MD;  Location: [facility];  Service: General;  Laterality: N/A;  Abdominal Seroma Evacuation  . Back surgery    . Lumbar disc surgery  1970'-80's    X 3  . Wound debridement  03/20/2011    Procedure: DEBRIDEMENT ABDOMINAL WOUND;  Surgeon: Rolm Bookbinder, MD;  Location: [place];  Service: General;  Laterality: N/A;  debridement abdominal wall, placement of wound vac  . Av fistula placement  09/30/2011    Procedure: ARTERIOVENOUS (AV) FISTULA CREATION;  Surgeon: Conrad Double Spring, MD;  Location: [place];  Service: Vascular;  Laterality: Left;  BRACHIAL-CEPHALIC  . Bascilic vein transposition Left 11/10/2011    Left arm   . Insertion of dialysis catheter  01/05/2012    Procedure: INSERTION OF DIALYSIS CATHETER;  Surgeon: Conrad Atkinson, MD;  Location: [place];  Service: Vascular;  Laterality: N/A;  Right Internal Jugular Placement  . Bascilic vein transposition  01/28/2012    Procedure: BASCILIC VEIN TRANSPOSITION;  Surgeon: Conrad Dillon, MD;  Location: [place];  Service: Vascular;  Laterality: Left;  Second Stage   . Appendectomy    . Hernia repair    . Joint replacement    . Total knee arthroplasty Left 1980's  . Vaginal hysterectomy  1970's  . Dilation and curettage  of uterus  1960's  . Intraocular prostheses insertion Right 2013    "fell; knocked my eye outl" (08/23/2012)  . Eye surgery    . Cardiac catheterization  1980's  . Left heart catheterization with coronary angiogram N/A 08/25/2012    Procedure: LEFT HEART CATHETERIZATION WITH CORONARY ANGIOGRAM;  Surgeon: Birdie Riddle, MD;  Location: [place] CATH LAB;  Service: Cardiovascular;  Laterality: N/A;   HPI:  [name] is a 69 y.o. female, with coronary artery disease, CHF, diabetes with neuropathy, in stage renal disease, HTN, MI, OSA, GERD, lower back pain admitted with weakness and     stuttering speech. CT negative. No prior ST notes. No CXR. MD notes suspect TIA versus polypharmacy.   Assessment / Plan / Recommendation Clinical  Impression  Oral and pharyngeal swallow abilities within functional limits across consistencies. Pt denies recent difficulty swallowing, no recent pneumonia. Aspiration risk is low. Recommend continue regular consistency and thin liquids, pills with thin. SLP educated pt re: reflux precautions (hx reflux). No further ST needed.      Aspiration Risk  Mild aspiration risk    Diet Recommendation   Regular, thin  Medication Administration: Whole meds with liquid    Other  Recommendations Oral Care Recommendations: Oral care BID   Follow up Recommendations  None    Frequency and Duration            Swallow Study   General HPI: [name] is a 69 y.o. female, with coronary artery disease, CHF, diabetes with neuropathy, in stage renal disease, HTN, MI, OSA, GERD, lower back pain admitted with weakness and     stuttering speech. CT negative. No prior ST notes. No CXR. MD notes suspect TIA versus polypharmacy. Type of Study: Bedside Swallow Evaluation Previous Swallow Assessment:  (none) Diet Prior to this Study: Regular;Thin liquids Temperature Spikes Noted: No Respiratory Status: Room air History of Recent Intubation: No Behavior/Cognition: Alert;Cooperative;Pleasant mood Oral Cavity Assessment: Within Functional Limits Oral Care Completed by SLP: No Oral Cavity - Dentition: Dentures, top (incomplete bottom) Vision: Functional for self-feeding Self-Feeding Abilities: Able to feed self Patient Positioning: Upright in bed Baseline Vocal Quality: Normal Volitional Cough: Strong Volitional Swallow: Able to elicit    Oral/Motor/Sensory Function Overall Oral Motor/Sensory Function: Within functional limits   Ice Chips Ice chips: Not tested   Thin Liquid Thin Liquid: Within functional limits Presentation: Cup;Straw    Nectar Thick Nectar Thick Liquid: Not tested   Honey Thick Honey Thick Liquid: Not tested   Puree Puree: Within functional limits   Solid Solid: Within functional  limits       Houston Siren 12/05/2014,8:33 AM  Orbie Pyo Colvin Caroli.[employer] [phone]

## 2014-12-05 NOTE — Progress Notes (Signed)
[facility] PROGRESS NOTE  [name] [id number] DOB: 18-Mar-1945 DOA: 12/04/2014 PCP: [name], MD  Assessment/Plan: [name] is a 69 y.o. female with a Past Medical History of ESRD on dialysis, anemia, CHF, HTN, Blind R eye, MI, OSA/CPAP, HAs who presents with numbness and weakness worriesome for polypharmacy vs TIA (unlikely). Also there were some concern for slurred speech and tremors.   Neurological Deficits: Presents with tremors, weakness on leg, leaning to one side.  -Will get MRI.  -Neuro consulted concern for stroke.  -Will hold BP medications to allow permissive hypertension.  -Discussed with neuro and renal, will give IV bolus.   End-stage renal disease:  -Patient underwent emergent dialysis overnight 11-14 for hyperkalemia.  -Tuesday/Thursday/Saturday.  Acute Hyperkalemia/hypercalcemia Had dialysis overnight. Resolved.   Diabetes mellitus with peripheral vascular disease Hold Lantus to avoid hypoglycemia and patient also with poor oral intake. Continue with sliding scale insulin, check hemoglobin A1c.  Obstructive sleep apnea Patient is not on CPAP. Will order oxygen when necessary.  History of coronary artery disease and diastolic heart failure Sees Dr. Doylene Canard. Echo results from 2010 are listed below. Will order repeat echo.  Code Status: DNR Family Communication: care discussed with niece who was at bedside.  Disposition Plan: Remain inpatient for evaluation of AMS, and stroke. Neuro consulted. MRI ordered.    Consultants:  nephology  neurology  Procedures:  ECHO; Left ventricle: The cavity size was normal. Wall thickness was increased in a pattern of moderate LVH. Systolic function was normal. The estimated ejection fraction was in the range of 60% to 65%. Wall motion was normal; there were no regional wall motion abnormalities. Doppler parameters are consistent with abnormal left ventricular relaxation (grade 1  diastolic dysfunction). Doppler parameters are consistent with high ventricular filling pressure. - Aortic valve: Sclerosis without stenosis.  Antibiotics:  none  HPI/Subjective: Patient report tremors has improved.  Per family member who was at bedside report tremors has decrease.    Objective: Filed Vitals:   12/05/14 0909  BP: 90/20  Pulse: 78  Temp: 97.6 F (36.4 C)  Resp: 16    Intake/Output Summary (Last 24 hours) at 12/05/14 1145 Last data filed at 12/05/14 0900  Gross per 24 hour  Intake    600 ml  Output     97 ml  Net    503 ml   Filed Weights   12/04/14 1216 12/04/14 1940 12/05/14 0102  Weight: 127.8 kg (281 lb 12 oz) 131.6 kg (290 lb 2 oz) 130.7 kg (288 lb 2.3 oz)    Exam:   General:  Alert , slowly answering question.   Cardiovascular: S 1, S 2 RRR  Respiratory: CTA  Abdomen: Bs present, soft, nt  Musculoskeletal: no edema  Neuro; alert, speech clear but slowly answer questions. Right side weakness.   Data Reviewed: Basic Metabolic Panel:  Recent Labs Lab 12/04/14 1222 12/04/14 1258 12/04/14 2045 12/04/14 2153 12/05/14 0544  NA 138 137 138  --  137  K 6.6* 6.2* 7.2* 3.8 4.5  CL 100* 102 100*  --  97*  CO2 26  --  23  --  30  GLUCOSE 131* 125* 72  --  135*  BUN 59* 57* 64*  --  27*  CREATININE 10.56* 9.90* 10.89*  --  6.56*  CALCIUM 10.8*  --  10.1  --  9.1  PHOS  --   --  4.7*  --   --    Liver Function Tests:  Recent  Labs Lab 12/04/14 1222 12/04/14 2045 12/05/14 0544  AST 23  --  28  ALT 15  --  12*  ALKPHOS 41  --  37*  BILITOT 0.6  --  0.1*  PROT 7.5  --  6.1*  ALBUMIN 3.5 3.3* 2.9*   No results for input(s): LIPASE, AMYLASE in the last 168 hours. No results for input(s): AMMONIA in the last 168 hours. CBC:  Recent Labs Lab 12/04/14 1222 12/04/14 1258 12/04/14 2153  WBC 7.7  --  8.6  NEUTROABS 4.2  --   --   HGB 11.2* 13.3 11.0*  HCT 36.1 39.0 34.6*  MCV 96.3  --  94.5  PLT 179  --  178   Cardiac  Enzymes: No results for input(s): CKTOTAL, CKMB, CKMBINDEX, TROPONINI in the last 168 hours. BNP (last 3 results) No results for input(s): BNP in the last 8760 hours.  ProBNP (last 3 results) No results for input(s): PROBNP in the last 8760 hours.  CBG:  Recent Labs Lab 12/04/14 1212 12/05/14 0108 12/05/14 0743  GLUCAP 130* 155* 110*    Recent Results (from the past 240 hour(s))  MRSA PCR Screening     Status: None   Collection Time: 12/05/14  2:57 AM  Result Value Ref Range Status   MRSA by PCR NEGATIVE NEGATIVE Final    Comment:        The GeneXpert MRSA Assay (FDA approved for NASAL specimens only), is one component of a comprehensive MRSA colonization surveillance program. It is not intended to diagnose MRSA infection nor to guide or monitor treatment for MRSA infections.      Studies: Ct Head Wo Contrast  12/04/2014  CLINICAL DATA:  Slurred speech and dizziness for 1 day. EXAM: CT HEAD WITHOUT CONTRAST TECHNIQUE: Contiguous axial images were obtained from the base of the skull through the vertex without intravenous contrast. COMPARISON:  None. FINDINGS: Sinuses/Soft tissues: Right globe resection with presumed prosthesis. Clear paranasal sinuses and mastoid air cells. Intracranial: Moderate low density in the periventricular white matter likely related to small vessel disease. Cerebral and cerebellar atrophy. The cerebellar atrophy is mildly age advanced. No mass lesion, hemorrhage, hydrocephalus, acute infarct, intra-axial, or extra-axial fluid collection. IMPRESSION: 1.  No acute intracranial abnormality. 2. Atrophy and small vessel ischemic change. Electronically Signed   By: Abigail Miyamoto M.D.   On: 12/04/2014 13:59    Scheduled Meds: . aspirin EC  81 mg Oral Daily  . cinacalcet  90 mg Oral Q breakfast  . heparin  5,000 Units Subcutaneous 3 times per day  . insulin aspart  0-9 Units Subcutaneous TID WC  . insulin glargine  5 Units Subcutaneous QHS  .  ipratropium-albuterol  3 mL Nebulization TID  . isosorbide mononitrate  60 mg Oral Daily  . multivitamin with minerals  1 tablet Oral QHS  . sevelamer carbonate  2,400 mg Oral TID WC  . sodium chloride  3 mL Intravenous Q12H  . sodium chloride  3 mL Intravenous Q12H   Continuous Infusions:   Principal Problem:   Neurological deficit present Active Problems:   End stage renal disease (H[facility])   Hyperkalemia   Acute hyperkalemia   OSA on CPAP   Hypercalcemia    Time spent: 25 minutes.     Niel Hummer A  [facility] Pager [phone]. If 7PM-7AM, please contact night-coverage at [email or web address], password [facility] 12/05/2014, 11:45 AM  LOS: 1 day

## 2014-12-05 NOTE — Progress Notes (Signed)
New Admission Note:  Arrival Method: Via stretcher with dialysis nurse Mental Orientation: Alert & oriented x4 Telemetry: Box 17, CCMD notified Assessment: Completed Skin: see doc flowsheet  IV: Right AC, saline locked Pain: denies pain Tubes: n/a Safety Measures: Safety Fall Prevention Plan was discussed, pt verbalized understanding. Admission: Completed [address] [place] Orientation: Patient has been orientated to the room, unit and the staff. Family: none at bedside  Orders have been reviewed and implemented. Will continue to monitor the patient. Call light has been placed within reach and bed alarm has been activated.   Leandro Reasoner BSN, RN  Phone Number: [phone] [facility]

## 2014-12-05 NOTE — Progress Notes (Signed)
Utilization review completed. Shamanda Len, RN, BSN. 

## 2014-12-05 NOTE — Clinical Documentation Improvement (Signed)
Internal Medicine  Can the diagnosis of anemia be further specified? Please update your documentation within the medical record to reflect your response to this query. Do not document in BPA drop down box.Thank you!   Iron deficiency Anemia  Nutritional anemia, including the nutrition or mineral deficits  Chronic Blood Loss Anemia, including the suspected or known cause  Anemia of chronic disease, including the associated chronic disease state  Other  Clinically Undetermined  Document any associated diagnoses/conditions.  Supporting Information:  ESRD on HD  Please exercise your independent, professional judgment when responding. A specific answer is not anticipated or expected.  Thank You,  Zoila Shutter RN, [place] [phone]; Cell: [phone]

## 2014-12-05 NOTE — Consult Note (Signed)
Requesting Physician: Dr. Tyrell Antonio    Chief Complaint: possible stroke  HPI:                                                                                                                                          78 of PMHx is obtained from chart  [name] is an 69 y.o. female with coronary artery disease, CHF, diabetes with neuropathy, in stage renal disease (dialyzes TTS) who presents to the emergency department Monday with neurological deficits. According to her family the patient developed uncontrollable twitching in her right arm on Saturday. On Sunday she developed weakness in her legs and her daughter noticed she was leaning to one side. Today [facility] [employer] and [facility] st[facility] speech. She is complaining of unbalanced gait, and significant dyspnea on exertion. She noted that on Monday she had weakness on her right side which was new and a facial droop on her right side that is new.  She does not recall the twitching. CT head obtained shows no acute bleed or stroke. Neurology asked to see due to possible stroke. Of note, her BP have been very soft over the course of the day 76-90/20-48.    Date last known well: Date: 12/05/2014 Time last known well: Unable to determine tPA Given: No: out of window     Past Medical History  Diagnosis Date  . Abdominal abscess ([place]) 12-17-10    abdominal abscesses x2 ? one at this time  . Staph aureus infection November 2012   . Swelling of both ankles 12-17-10  . Anemia   . Coronary artery disease     Dr Kadakia-cardiologist 2-3 x year  . CHF (congestive heart failure) ([place])   . Dysrhythmia     irregular, "skips beats"  . Hypertension     sees Dr. Willey Blade  . Neuromuscular disorder ([place])     diabetic neuropathy  . Blind right eye     fell and crushed socket and eye fell out, was replaced at [facility]  . Heart murmur   . Myocardial infarction ([place]) 1970's or 1980's  . Exertional shortness of  breath   . Inhalation injury     "worked at [employer]; can't inhale polyurethane or paint, etc" (08/23/2012)  . OSA on CPAP   . Type II diabetes mellitus ([place])     "over 10 years now" (08/23/2012)  . History of blood transfusion     "lots before starting dialysis" (08/23/2012)  . H/O hiatal hernia   . Headache(784.0)     "weekly sometimes; since I fell and hit my head in 07/2011 08/23/2012)  . Eye drainage     "lots; since fall 07/2011" (08/23/2012)  . Arthritis     "all over" (08/23/2012)  . Chronic lower back pain   . Gout   . ESRD (end stage renal disease) on dialysis (  [place])     "started 12/2011; [facility]; TTS" (08/23/2012    Past Surgical History  Procedure Laterality Date  . Ventral hernia repair  2012-2013    component separation, repair with biologic; "had 3 surgeries to fix it" (08/23/2012)  . Cataract extraction w/ intraocular lens implant Bilateral 1990's  . Laparotomy  12/18/2010    Procedure: EXPLORATORY LAPAROTOMY;  Surgeon: Rolm Bookbinder, MD;  Location: [facility];  Service: General;  Laterality: N/A;  Abdominal Seroma Evacuation  . Back surgery    . Lumbar disc surgery  1970'-80's    X 3  . Wound debridement  03/20/2011    Procedure: DEBRIDEMENT ABDOMINAL WOUND;  Surgeon: Rolm Bookbinder, MD;  Location: [facility];  Service: General;  Laterality: N/A;  debridement abdominal wall, placement of wound vac  . Av fistula placement  09/30/2011    Procedure: ARTERIOVENOUS (AV) FISTULA CREATION;  Surgeon: Conrad Hanahan, MD;  Location: [facility];  Service: Vascular;  Laterality: Left;  BRACHIAL-CEPHALIC  . Bascilic vein transposition Left 11/10/2011    Left arm   . Insertion of dialysis catheter  01/05/2012    Procedure: INSERTION OF DIALYSIS CATHETER;  Surgeon: Conrad Grimes, MD;  Location: [place];  Service: Vascular;  Laterality: N/A;  Right Internal Jugular Placement  . Bascilic vein transposition  01/28/2012    Procedure: BASCILIC VEIN TRANSPOSITION;  Surgeon: Conrad Tradewinds, MD;  Location: [facility];  Service: Vascular;  Laterality: Left;  Second Stage   . Appendectomy    . Hernia repair    . Joint replacement    . Total knee arthroplasty Left 1980's  . Vaginal hysterectomy  1970's  . Dilation and curettage of uterus  1960's  . Intraocular prostheses insertion Right 2013    "fell; knocked my eye outl" (08/23/2012)  . Eye surgery    . Cardiac catheterization  1980's  . Left heart catheterization with coronary angiogram N/A 08/25/2012    Procedure: LEFT HEART CATHETERIZATION WITH CORONARY ANGIOGRAM;  Surgeon: Birdie Riddle, MD;  Location: [facility];  Service: Cardiovascular;  Laterality: N/A;    Family History  Problem Relation Age of Onset  . Hypertension Mother   . Cancer Brother     spine  . Cancer Sister     brain  . Hypertension Maternal Grandmother   . Anesthesia problems Neg Hx   . Hypertension Father    Social History:  reports that she quit smoking about 33 years ago. Her smoking use included Cigarettes. She has a 4 pack-year smoking history. She has never used smokeless tobacco. She reports that she does not drink alcohol or use illicit drugs.  Allergies: No Known Allergies  Medications:                                                                                                                           Prior to Admission:  Prescriptions prior to admission  Medication Sig Dispense Refill Last Dose  .  acetaminophen (TYLENOL) 500 MG tablet Take 1,000 mg by mouth every 6 (six) hours as needed. For pain   Past Month at Unknown time  . albuterol (PROVENTIL HFA;VENTOLIN HFA) 108 (90 BASE) MCG/ACT inhaler Inhale 2 puffs into the lungs every 4 (four) hours as needed for wheezing or shortness of breath.   Past Month at Unknown time  . amLODipine (NORVASC) 10 MG tablet Take 10 mg by mouth daily.    12/04/2014 at Unknown time  . aspirin EC 81 MG tablet Take 81 mg by mouth daily.     12/04/2014 at Unknown time  . bisacodyl (DULCOLAX) 5 MG EC tablet Take 10 mg by mouth  daily as needed. For constipation   12/04/2014 at Unknown time  . Calcium Carbonate-Vitamin D (CALCIUM 600 + D PO) Take 1 tablet by mouth daily.    12/04/2014 at Unknown time  . carvedilol (COREG) 25 MG tablet Take 25 mg by mouth 2 (two) times daily with a meal.   12/04/2014 at Unknown time  . gabapentin (NEURONTIN) 100 MG capsule Take 100 mg by mouth at bedtime.  2 12/03/2014 at Unknown time  . hydrALAZINE (APRESOLINE) 50 MG tablet Take 50 mg by mouth daily.    12/04/2014 at 8a  . HYDROcodone-acetaminophen (NORCO/VICODIN) 5-325 MG per tablet Take 1-2 tablets by mouth every 6 (six) hours as needed for pain.   Past Week at Unknown time  . indomethacin (INDOCIN) 50 MG capsule Take 50 mg by mouth 2 (two) times daily as needed (for gout pain).   Past Week at Unknown time  . insulin glargine (LANTUS) 100 UNIT/ML injection Inject 5-10 Units into the skin at bedtime. Patient uses sliding scale.   12/01/2014 at Unknown time  . isosorbide mononitrate (IMDUR) 60 MG 24 hr tablet Take 60 mg by mouth daily.   12/03/2014 at Unknown time  . metoprolol succinate (TOPROL-XL) 50 MG 24 hr tablet Take 50 mg by mouth daily.  2 12/03/2014 at 8a  . Multiple Vitamin (MULITIVITAMIN WITH MINERALS) TABS Take 1 tablet by mouth daily.   12/03/2014 at Unknown time  . potassium chloride SA (K-DUR,KLOR-CON) 20 MEQ tablet Take 20 mEq by mouth 2 (two) times daily.   12/03/2014 at Unknown time  . SENSIPAR 90 MG tablet Take 90 mg by mouth daily.  4 12/03/2014 at Unknown time  . sevelamer carbonate (RENVELA) 800 MG tablet Take 2,400 mg by mouth 3 (three) times daily with meals.   12/04/2014 at Unknown time  . nitroGLYCERIN (NITROSTAT) 0.4 MG SL tablet Place 0.4 mg under the tongue every 5 (five) minutes as needed for chest pain.   unknown   Scheduled: . aspirin EC  81 mg Oral Daily  . cinacalcet  90 mg Oral Q breakfast  . heparin  5,000 Units Subcutaneous 3 times per day  . insulin aspart  0-9 Units Subcutaneous TID WC  .  ipratropium-albuterol  3 mL Nebulization TID  . multivitamin with minerals  1 tablet Oral QHS  . sevelamer carbonate  2,400 mg Oral TID WC  . sodium chloride  3 mL Intravenous Q12H  . sodium chloride  3 mL Intravenous Q12H    ROS:  History obtained from the patient  General ROS: negative for - chills, fatigue, fever, night sweats, weight gain or weight loss Psychological ROS: negative for - behavioral disorder, hallucinations, memory difficulties, mood swings or suicidal ideation Ophthalmic ROS: negative for - blurry vision, double vision, eye pain or loss of vision ENT ROS: negative for - epistaxis, nasal discharge, oral lesions, sore throat, tinnitus or vertigo Allergy and Immunology ROS: negative for - hives or itchy/watery eyes Hematological and Lymphatic ROS: negative for - bleeding problems, bruising or swollen lymph nodes Endocrine ROS: negative for - galactorrhea, hair pattern changes, polydipsia/polyuria or temperature intolerance Respiratory ROS: negative for - cough, hemoptysis, shortness of breath or wheezing Cardiovascular ROS: negative for - chest pain, dyspnea on exertion, edema or irregular heartbeat Gastrointestinal ROS: negative for - abdominal pain, diarrhea, hematemesis, nausea/vomiting or stool incontinence Genito-Urinary ROS: negative for - dysuria, hematuria, incontinence or urinary frequency/urgency Musculoskeletal ROS: negative for - joint swelling or muscular weakness Neurological ROS: as noted in HPI Dermatological ROS: negative for rash and skin lesion changes  Neurologic Examination:                                                                                                      Blood pressure 90/20, pulse 78, temperature 97.6 F (36.4 C), temperature source Oral, resp. rate 16, height 5\' 5"  (1.651 m), weight 130.7 kg (288 lb  2.3 oz), SpO2 93 %.  HEENT-  Normocephalic, no lesions, without obvious abnormality.  Normal external eye and conjunctiva.  Normal TM's bilaterally.  Normal auditory canals and external ears. Normal external nose, mucus membranes and septum.  Normal pharynx. Cardiovascular- S1, S2 normal, pulses palpable throughout   Lungs- chest clear, no wheezing, rales, normal symmetric air entry Abdomen- normal findings: bowel sounds normal Extremities- no edema Lymph-no adenopathy palpable Musculoskeletal-no joint tenderness, deformity or swelling Skin-warm and dry, no hyperpigmentation, vitiligo, or suspicious lesions  Neurological Examination Mental Status: Alert, oriented, thought content appropriate.  Speech fluent without evidence of aphasia.  Able to follow 3 step commands without difficulty. Cranial Nerves: II:  --glass eye on the right; Visual fields grossly normal, pupil, round, reactive to light and accommodation III,IV, VI: ptosis  Present in the right eye --old, extra-ocular motions intact in the left eye V,VII: smile symmetric, facial light touch sensation decreased on the right VIII: hearing normal bilaterally IX,X: uvula rises symmetrically XI: bilateral shoulder shrug XII: midline tongue extension Motor: Right : Upper extremity   5/5    Left:     Upper extremity   5/5  Lower extremity   5/5     Lower extremity   5/5 --asterixis noted with UE and LE Mild b/l toe weakness in distal LE  Tone and bulk:normal tone throughout  Sensory: Pinprick and light touch decreased in a length dependent manner, from diabetic neuropathy  Deep Tendon Reflexes: 1+ and symmetric in biceps/triceps, rest absent Plantars: Mute bilaterally Cerebellar : normal finger-to-nose  Gait: not tested due to negative myoclonus      Lab Results: Basic Metabolic Panel:  Recent Labs Lab 12/04/14 1222 12/04/14  1258 12/04/14 2045 12/04/14 2153 12/05/14 0544  NA 138 137 138  --  137  K 6.6* 6.2* 7.2* 3.8  4.5  CL 100* 102 100*  --  97*  CO2 26  --  23  --  30  GLUCOSE 131* 125* 72  --  135*  BUN 59* 57* 64*  --  27*  CREATININE 10.56* 9.90* 10.89*  --  6.56*  CALCIUM 10.8*  --  10.1  --  9.1  PHOS  --   --  4.7*  --   --     Liver Function Tests:  Recent Labs Lab 12/04/14 1222 12/04/14 2045 12/05/14 0544  AST 23  --  28  ALT 15  --  12*  ALKPHOS 41  --  37*  BILITOT 0.6  --  0.1*  PROT 7.5  --  6.1*  ALBUMIN 3.5 3.3* 2.9*   No results for input(s): LIPASE, AMYLASE in the last 168 hours. No results for input(s): AMMONIA in the last 168 hours.  CBC:  Recent Labs Lab 12/04/14 1222 12/04/14 1258 12/04/14 2153  WBC 7.7  --  8.6  NEUTROABS 4.2  --   --   HGB 11.2* 13.3 11.0*  HCT 36.1 39.0 34.6*  MCV 96.3  --  94.5  PLT 179  --  178    Cardiac Enzymes: No results for input(s): CKTOTAL, CKMB, CKMBINDEX, TROPONINI in the last 168 hours.  Lipid Panel:  Recent Labs Lab 12/05/14 0544  CHOL 68  TRIG 208*  HDL 32*  CHOLHDL 2.1  VLDL 42*  LDLCALC NEG 6    CBG:  Recent Labs Lab 12/04/14 1212 12/05/14 0108 12/05/14 0743 12/05/14 1127  GLUCAP 130* 155* 110* 121*    Microbiology: Results for orders placed or performed during the hospital encounter of 12/04/14  MRSA PCR Screening     Status: None   Collection Time: 12/05/14  2:57 AM  Result Value Ref Range Status   MRSA by PCR NEGATIVE NEGATIVE Final    Comment:        The GeneXpert MRSA Assay (FDA approved for NASAL specimens only), is one component of a comprehensive MRSA colonization surveillance program. It is not intended to diagnose MRSA infection nor to guide or monitor treatment for MRSA infections.     Coagulation Studies:  Recent Labs  12/04/14 1222  LABPROT 15.0  INR 1.16    Imaging: Ct Head Wo Contrast  12/04/2014  CLINICAL DATA:  Slurred speech and dizziness for 1 day. EXAM: CT HEAD WITHOUT CONTRAST TECHNIQUE: Contiguous axial images were obtained from the base of the skull  through the vertex without intravenous contrast. COMPARISON:  None. FINDINGS: Sinuses/Soft tissues: Right globe resection with presumed prosthesis. Clear paranasal sinuses and mastoid air cells. Intracranial: Moderate low density in the periventricular white matter likely related to small vessel disease. Cerebral and cerebellar atrophy. The cerebellar atrophy is mildly age advanced. No mass lesion, hemorrhage, hydrocephalus, acute infarct, intra-axial, or extra-axial fluid collection. IMPRESSION: 1.  No acute intracranial abnormality. 2. Atrophy and small vessel ischemic change. Electronically Signed   By: Abigail Miyamoto M.D.   On: 12/04/2014 13:59     Assessment: 69 y.o. female with asterixis, likely secondary toxic-metabolic encephalopathy due to ESRD, Cr 6, polypharmacy from lytrica/gabapentin, and other pain meds. No definite motor weakness noted. She does have negative myoclonus (asterixis) causing knee buckling and dropping objects from hands. Diabetic polyneuropathy with b/l toe weakness, length dependant sensory loss noted. Grossly, no clinical e/o  for a large infarct, but can't rule out small sub clinical infarcts, especially watershed infarcts due to hypotension.   Recommendations: 1.MRI  of the brain without contrast 2. Avoid lyrica/gabapentin, minimize use of any neuro/pain meds.  3. PT consult, for gait training Continue Aspirin - dose 325 daily  Will f/u after brain mri.

## 2014-12-05 NOTE — Progress Notes (Signed)
In and out cath attempted 2x, unable to obtain urine. NP on call notified.

## 2014-12-06 ENCOUNTER — Inpatient Hospital Stay (HOSPITAL_COMMUNITY): Payer: Commercial Managed Care - HMO

## 2014-12-06 DIAGNOSIS — R531 Weakness: Secondary | ICD-10-CM

## 2014-12-06 DIAGNOSIS — E875 Hyperkalemia: Secondary | ICD-10-CM

## 2014-12-06 DIAGNOSIS — N186 End stage renal disease: Secondary | ICD-10-CM

## 2014-12-06 DIAGNOSIS — G4733 Obstructive sleep apnea (adult) (pediatric): Secondary | ICD-10-CM

## 2014-12-06 DIAGNOSIS — I6930 Unspecified sequelae of cerebral infarction: Secondary | ICD-10-CM

## 2014-12-06 DIAGNOSIS — R29818 Other symptoms and signs involving the nervous system: Secondary | ICD-10-CM

## 2014-12-06 LAB — LIPID PANEL
Cholesterol: 91 mg/dL (ref 0–200)
HDL: 40 mg/dL — AB (ref >40)
LDL CALC: 39 mg/dL (ref 0–99)
TRIGLYCERIDES: 62 mg/dL (ref <150)
Total CHOL/HDL Ratio: 2.3 RATIO
VLDL: 12 mg/dL (ref 0–40)

## 2014-12-06 LAB — GLUCOSE, CAPILLARY
GLUCOSE-CAPILLARY: 95 mg/dL (ref 65–99)
GLUCOSE-CAPILLARY: 127 mg/dL — AB (ref 65–99)
GLUCOSE-CAPILLARY: 153 mg/dL — AB (ref 65–99)
Glucose-Capillary: 185 mg/dL — ABNORMAL HIGH (ref 65–99)

## 2014-12-06 LAB — HEMOGLOBIN A1C
HEMOGLOBIN A1C: 6.4 % — AB (ref 4.8–5.6)
MEAN PLASMA GLUCOSE: 137 mg/dL

## 2014-12-06 NOTE — Progress Notes (Signed)
[facility] PROGRESS NOTE  [name] [id number] DOB: September 27, 1945 DOA: 12/04/2014 PCP: [name], MD  Assessment/Plan: Neurological Deficits: Presents with tremors, weakness on legs and off balance. Most likely toxicity from neurontin along with lyrica -MRI neg for stroke  -Neuro consulted and recommending stop neurontin, lyrica and pain meds -patient much better already -per PT and CIR rec's will discharge home with [facility] services -if remains stable/continue improving, most likely home on 11/17 after HD  HTN: currently with hypotension -Will hold BP medications to allow permissive hypertension.  -IV bolus given on 11/15, without much changes -patient is asymptomatic and denies lightheadedness  -will monitor  End-stage renal disease:  -Patient underwent emergent dialysis on 11-14 for hyperkalemia.  -usual schedule Tuesday/Thursday/Saturday. -renal on board, will follow rec's  Acute Hyperkalemia/hypercalcemia Resolved with HD.   Diabetes mellitus with peripheral vascular disease -Hold Lantus to avoid hypoglycemia and patient also with poor oral intake. Continue with sliding scale insulin. -A1C 6.4  Obstructive sleep apnea Patient is not on CPAP. Will order oxygen when necessary (especially at night).  History of coronary artery disease and diastolic heart failure -Actively follow by Dr. Doylene Canard. Echo results from 2010 are listed below.  -Will follow repeated echo. -no CP or SOB  Anemia of chronic disease: due to renal failure -ESA and IV iron as per renal discretions -Hgb stable  Code Status: DNR Family Communication:no family at bedside Disposition Plan: most likely home with [facility] services in am; doing much better   Consultants:  Neurology  Renal service  CIR  Procedures:  EEG: no signs of epileptiforms  2-D echo  ECHO; Left ventricle: The cavity size was normal. Wall thickness was increased in a pattern of moderate LVH. Systolic  function was normal. The estimated ejection fraction was in the range of 60% to 65%. Wall motion was normal; there were no regional wall motion abnormalities. Doppler parameters are consistent with abnormal left ventricular relaxation (grade 1 diastolic dysfunction). Doppler parameters are consistent with high ventricular filling pressure. - Aortic valve: Sclerosis without stenosis.  See below for x-ray reports    Antibiotics:  None   HPI/Subjective: Feeling better and with much less tremors. No CP or SOB  Objective: Filed Vitals:   12/06/14 0551  BP: 95/33  Pulse: 72  Temp: 98.2 F (36.8 C)  Resp: 18    Intake/Output Summary (Last 24 hours) at 12/06/14 1605 Last data filed at 12/06/14 0900  Gross per 24 hour  Intake    480 ml  Output    703 ml  Net   -223 ml   Filed Weights   12/05/14 1548 12/05/14 1809 12/05/14 2037  Weight: 132.7 kg (292 lb 8.8 oz) 131.8 kg (290 lb 9.1 oz) 131.9 kg (290 lb 12.6 oz)    Exam:   General:  Afebrile, no CP, no SOB. Feeling much better  Cardiovascular: S1 and s2, no rubs or gallops  Respiratory: CTA bilaterally  Abdomen: soft, NT, ND, positive BS  Musculoskeletal: no edema or cyanosis   Data Reviewed: Basic Metabolic Panel:  Recent Labs Lab 12/04/14 1222 12/04/14 1258 12/04/14 2045 12/04/14 2153 12/05/14 0544  NA 138 137 138  --  137  K 6.6* 6.2* 7.2* 3.8 4.5  CL 100* 102 100*  --  97*  CO2 26  --  23  --  30  GLUCOSE 131* 125* 72  --  135*  BUN 59* 57* 64*  --  27*  CREATININE 10.56* 9.90* 10.89*  --  6.56*  CALCIUM 10.8*  --  10.1  --  9.1  PHOS  --   --  4.7*  --   --    Liver Function Tests:  Recent Labs Lab 12/04/14 1222 12/04/14 2045 12/05/14 0544  AST 23  --  28  ALT 15  --  12*  ALKPHOS 41  --  37*  BILITOT 0.6  --  0.1*  PROT 7.5  --  6.1*  ALBUMIN 3.5 3.3* 2.9*   CBC:  Recent Labs Lab 12/04/14 1222 12/04/14 1258 12/04/14 2153  WBC 7.7  --  8.6  NEUTROABS 4.2  --   --    HGB 11.2* 13.3 11.0*  HCT 36.1 39.0 34.6*  MCV 96.3  --  94.5  PLT 179  --  178   CBG:  Recent Labs Lab 12/05/14 0743 12/05/14 1127 12/05/14 2035 12/06/14 0800 12/06/14 1135  GLUCAP 110* 121* 92 95 127*    Recent Results (from the past 240 hour(s))  MRSA PCR Screening     Status: None   Collection Time: 12/05/14  2:57 AM  Result Value Ref Range Status   MRSA by PCR NEGATIVE NEGATIVE Final    Comment:        The GeneXpert MRSA Assay (FDA approved for NASAL specimens only), is one component of a comprehensive MRSA colonization surveillance program. It is not intended to diagnose MRSA infection nor to guide or monitor treatment for MRSA infections.      Studies: Mr Virgel Paling Wo Contrast  12/05/2014  CLINICAL DATA:  Neurological deficits: Uncontrollable twitching RIGHT arm beginning 5 days ago, leg weakness, stuttering speech beginning today. History of end-stage renal disease on dialysis, CHF, hypertension, RIGHT eye blindness, diabetes. EXAM: MRI HEAD WITHOUT CONTRAST MRA HEAD WITHOUT CONTRAST TECHNIQUE: Multiplanar, multiecho pulse sequences of the brain and surrounding structures were obtained without intravenous contrast. Angiographic images of the head were obtained using MRA technique without contrast. Coronal diffusion weighted imaging not able to be obtained. COMPARISON:  CT head December 04, 2014 FINDINGS: MRI HEAD FINDINGS Mildly motion degraded examination. No reduced diffusion to suggest acute ischemia. No susceptibility artifact to suggest hemorrhage. Ventricles and sulci are normal for patient's age. Old small RIGHT cerebellar infarct. Old RIGHT thalamus and bilateral basal ganglia lacunar infarcts. Patchy to confluent supratentorial and pontine white matter T2 hyperintensities without midline shift, mass effect or mass lesions. No abnormal extra-axial fluid collections. RIGHT ocular globe prosthesis. Status post LEFT ocular lens implant. Mild paranasal sinus mucosal  thickening without air-fluid levels. Mastoid air cells are well aerated. Mildly expanded sella move most compatible with partially empty sella. No cerebellar tonsillar ectopia. Heterogeneous bone marrow signal, suggesting renal osteodystrophy in patient with history of end-stage renal disease. Patient is edentulous. MRA HEAD FINDINGS Moderate to severely motion degraded examination results in duplicated appearance of the intracranial vessels. Flow related enhancement within the cervical, petrous, cavernous and supraclinoid internal carotid arteries. LEFT A1 segment is dominant with flow related enhancement within the anterior and middle cerebral arteries. Flow related enhancement within the vertebral arteries, basilar artery and posterior cerebral arteries. Small apparent bilateral posterior communicating arteries present. No acute large vessel occlusion. No high-grade stenosis, limited assessment for moderate low grade stenosis, move limited assessment for aneurysm due to motion. IMPRESSION: MRI HEAD: No acute intracranial process on this mildly motion degraded examination. Chronic changes including old bilateral basal ganglia, RIGHT thalamus lacunar infarcts and old small RIGHT cerebellar infarct. Moderate to severe chronic small vessel ischemic disease. MRA HEAD: Motion  degraded examination without large vessel occlusion or high-grade stenosis. Electronically Signed   By: Elon Alas M.D.   On: 12/05/2014 22:48   Mr Brain Wo Contrast  12/05/2014  CLINICAL DATA:  Neurological deficits: Uncontrollable twitching RIGHT arm beginning 5 days ago, leg weakness, stuttering speech beginning today. History of end-stage renal disease on dialysis, CHF, hypertension, RIGHT eye blindness, diabetes. EXAM: MRI HEAD WITHOUT CONTRAST MRA HEAD WITHOUT CONTRAST TECHNIQUE: Multiplanar, multiecho pulse sequences of the brain and surrounding structures were obtained without intravenous contrast. Angiographic images of the  head were obtained using MRA technique without contrast. Coronal diffusion weighted imaging not able to be obtained. COMPARISON:  CT head December 04, 2014 FINDINGS: MRI HEAD FINDINGS Mildly motion degraded examination. No reduced diffusion to suggest acute ischemia. No susceptibility artifact to suggest hemorrhage. Ventricles and sulci are normal for patient's age. Old small RIGHT cerebellar infarct. Old RIGHT thalamus and bilateral basal ganglia lacunar infarcts. Patchy to confluent supratentorial and pontine white matter T2 hyperintensities without midline shift, mass effect or mass lesions. No abnormal extra-axial fluid collections. RIGHT ocular globe prosthesis. Status post LEFT ocular lens implant. Mild paranasal sinus mucosal thickening without air-fluid levels. Mastoid air cells are well aerated. Mildly expanded sella move most compatible with partially empty sella. No cerebellar tonsillar ectopia. Heterogeneous bone marrow signal, suggesting renal osteodystrophy in patient with history of end-stage renal disease. Patient is edentulous. MRA HEAD FINDINGS Moderate to severely motion degraded examination results in duplicated appearance of the intracranial vessels. Flow related enhancement within the cervical, petrous, cavernous and supraclinoid internal carotid arteries. LEFT A1 segment is dominant with flow related enhancement within the anterior and middle cerebral arteries. Flow related enhancement within the vertebral arteries, basilar artery and posterior cerebral arteries. Small apparent bilateral posterior communicating arteries present. No acute large vessel occlusion. No high-grade stenosis, limited assessment for moderate low grade stenosis, move limited assessment for aneurysm due to motion. IMPRESSION: MRI HEAD: No acute intracranial process on this mildly motion degraded examination. Chronic changes including old bilateral basal ganglia, RIGHT thalamus lacunar infarcts and old small RIGHT  cerebellar infarct. Moderate to severe chronic small vessel ischemic disease. MRA HEAD: Motion degraded examination without large vessel occlusion or high-grade stenosis. Electronically Signed   By: Elon Alas M.D.   On: 12/05/2014 22:48    Scheduled Meds: . aspirin EC  81 mg Oral Daily  . cinacalcet  90 mg Oral Q breakfast  . heparin  5,000 Units Subcutaneous 3 times per day  . insulin aspart  0-9 Units Subcutaneous TID WC  . ipratropium-albuterol  3 mL Nebulization TID  . multivitamin with minerals  1 tablet Oral QHS  . sevelamer carbonate  2,400 mg Oral TID WC  . sodium chloride  250 mL Intravenous Once  . sodium chloride  3 mL Intravenous Q12H  . sodium chloride  3 mL Intravenous Q12H   Continuous Infusions:   Principal Problem:   Neurological deficit present Active Problems:   End stage renal disease (HCC)   Hyperkalemia   Acute hyperkalemia   OSA on CPAP   Hypercalcemia    Time spent: 30 minutes    Barton Dubois  [facility] Pager [phone]. If 7PM-7AM, please contact night-coverage at [email or web address], password [facility] 12/06/2014, 4:05 PM  LOS: 2 days

## 2014-12-06 NOTE — Progress Notes (Signed)
EEG completed; results pending.    

## 2014-12-06 NOTE — Consult Note (Signed)
Physical Medicine and Rehabilitation Consult  Reason for Consult:  RLE instability with tremors and dyspnea Referring Physician: Dr. Dyann Kief   HPI: [name] is a 69 y.o. female with history of CAD, DM type 2 with neuropathy, ESRD0- HD T,T,S, anemia of chronic disease, CVA with residual right sided weakness who was admitted on 12/03/13 with right sided weakness and slurred speech. Family reported patient developing uncontrollable twitching of RUE on 11/12 followed by BLE weakness with gait problems on the next day which progressed to weakness on 11/14.  CT head was negative for acute changes. She was noted to have hyperkalemia with slurred speech and was treated with HD.  Neurology felt that patient had asterixis due to toxic/metabolic encephalopathy and recommended MRI brain for work up. He recommended d/c of  Lyrica (which had been recently started) and Neurontin.  MRI brain negative for acute changes with chronic bilateral BG, thalamic and right cerebellar infarcts as well as moderate to severe chronic small vessel disease. MRA brain without large vessel occlusion or high grade stenosis. 2D echo with EF 60%-65% and grade 1 diastolic dysfunction. ST evaluation with normal oral and pharyngeal abilities.  PT evaluation done and patient limited by RLE instability, balance deficits as well as significant DOE. CIR recommended for follow up therapy.   Independent in home environment. Takes SCAT to HD and has family drive her to [facility].  Has aide 2 hours/5 days a week to clean, cook and some assistance with ADLs.  The patient feels she is back at her baseline. She states that she can get out of bed and wants to demonstrate this. Prior stroke was in the 1980s and left her with some chronic right sided weakness.  Review of Systems  Eyes: Negative for blurred vision.       Blind right eye  Cardiovascular: Negative for chest pain and palpitations.     Past Medical History  Diagnosis Date  .  Abdominal abscess ([place]) 12-17-10    abdominal abscesses x2 ? one at this time  . Staph aureus infection November 2012   . Swelling of both ankles 12-17-10  . Anemia   . Coronary artery disease     Dr Kadakia-cardiologist 2-3 x year  . CHF (congestive heart failure) ([place])   . Dysrhythmia     irregular, "skips beats"  . Hypertension     sees Dr. Willey Blade  . Neuromuscular disorder ([place])     diabetic neuropathy  . Blind right eye     fell and crushed socket and eye fell out, was replaced at [facility]  . Heart murmur   . Myocardial infarction ([place]) 1970's or 1980's  . Exertional shortness of breath   . Inhalation injury     "worked at [employer]; can't inhale polyurethane or paint, etc" (08/23/2012)  . OSA on CPAP   . Type II diabetes mellitus ([place])     "over 10 years now" (08/23/2012)  . History of blood transfusion     "lots before starting dialysis" (08/23/2012)  . H/O hiatal hernia   . Headache(784.0)     "weekly sometimes; since I fell and hit my head in 07/2011 08/23/2012)  . Eye drainage     "lots; since fall 07/2011" (08/23/2012)  . Arthritis     "all over" (08/23/2012)  . Chronic lower back pain   . Gout   . ESRD (end stage renal disease) on dialysis ([place])     "started 12/2011; [place];  TTS" (08/23/2012    Past Surgical History  Procedure Laterality Date  . Ventral hernia repair  2012-2013    component separation, repair with biologic; "had 3 surgeries to fix it" (08/23/2012)  . Cataract extraction w/ intraocular lens implant Bilateral 1990's  . Laparotomy  12/18/2010    Procedure: EXPLORATORY LAPAROTOMY;  Surgeon: Rolm Bookbinder, MD;  Location: [facility];  Service: General;  Laterality: N/A;  Abdominal Seroma Evacuation  . Back surgery    . Lumbar disc surgery  1970'-80's    X 3  . Wound debridement  03/20/2011    Procedure: DEBRIDEMENT ABDOMINAL WOUND;  Surgeon: Rolm Bookbinder, MD;  Location: [place];  Service: General;  Laterality: N/A;  debridement abdominal wall,  placement of wound vac  . Av fistula placement  09/30/2011    Procedure: ARTERIOVENOUS (AV) FISTULA CREATION;  Surgeon: Conrad McFarland, MD;  Location: [place];  Service: Vascular;  Laterality: Left;  BRACHIAL-CEPHALIC  . Bascilic vein transposition Left 11/10/2011    Left arm   . Insertion of dialysis catheter  01/05/2012    Procedure: INSERTION OF DIALYSIS CATHETER;  Surgeon: Conrad Walland, MD;  Location: [place];  Service: Vascular;  Laterality: N/A;  Right Internal Jugular Placement  . Bascilic vein transposition  01/28/2012    Procedure: BASCILIC VEIN TRANSPOSITION;  Surgeon: Conrad Turpin Hills, MD;  Location: [place];  Service: Vascular;  Laterality: Left;  Second Stage   . Appendectomy    . Hernia repair    . Joint replacement    . Total knee arthroplasty Left 1980's  . Vaginal hysterectomy  1970's  . Dilation and curettage of uterus  1960's  . Intraocular prostheses insertion Right 2013    "fell; knocked my eye outl" (08/23/2012)  . Eye surgery    . Cardiac catheterization  1980's  . Left heart catheterization with coronary angiogram N/A 08/25/2012    Procedure: LEFT HEART CATHETERIZATION WITH CORONARY ANGIOGRAM;  Surgeon: Birdie Riddle, MD;  Location: [place] [facility];  Service: Cardiovascular;  Laterality: N/A;    Family History  Problem Relation Age of Onset  . Hypertension Mother   . Cancer Brother     spine  . Cancer Sister     brain  . Hypertension Maternal Grandmother   . Anesthesia problems Neg Hx   . Hypertension Father     Social History:  Lives alone. Uses cane for ambulation. Drives couple times a week to the grocery store. Goes to [employer] 2 times a week.  She reports that she quit smoking about 33 years ago. Her smoking use included Cigarettes. She has a 4 pack-year smoking history. She has never used smokeless tobacco. She reports that she does not drink alcohol or use illicit drugs.    Allergies: No Known Allergies    Medications Prior to Admission  Medication Sig  Dispense Refill  . acetaminophen (TYLENOL) 500 MG tablet Take 1,000 mg by mouth every 6 (six) hours as needed. For pain    . albuterol (PROVENTIL HFA;VENTOLIN HFA) 108 (90 BASE) MCG/ACT inhaler Inhale 2 puffs into the lungs every 4 (four) hours as needed for wheezing or shortness of breath.    Marland Kitchen amLODipine (NORVASC) 10 MG tablet Take 10 mg by mouth daily.     Marland Kitchen aspirin EC 81 MG tablet Take 81 mg by mouth daily.      . bisacodyl (DULCOLAX) 5 MG EC tablet Take 10 mg by mouth daily as needed. For constipation    .  Calcium Carbonate-Vitamin D (CALCIUM 600 + D PO) Take 1 tablet by mouth daily.     . carvedilol (COREG) 25 MG tablet Take 25 mg by mouth 2 (two) times daily with a meal.    . gabapentin (NEURONTIN) 100 MG capsule Take 100 mg by mouth at bedtime.  2  . hydrALAZINE (APRESOLINE) 50 MG tablet Take 50 mg by mouth daily.     Marland Kitchen HYDROcodone-acetaminophen (NORCO/VICODIN) 5-325 MG per tablet Take 1-2 tablets by mouth every 6 (six) hours as needed for pain.    . indomethacin (INDOCIN) 50 MG capsule Take 50 mg by mouth 2 (two) times daily as needed (for gout pain).    . insulin glargine (LANTUS) 100 UNIT/ML injection Inject 5-10 Units into the skin at bedtime. Patient uses sliding scale.    . isosorbide mononitrate (IMDUR) 60 MG 24 hr tablet Take 60 mg by mouth daily.    . metoprolol succinate (TOPROL-XL) 50 MG 24 hr tablet Take 50 mg by mouth daily.  2  . Multiple Vitamin (MULITIVITAMIN WITH MINERALS) TABS Take 1 tablet by mouth daily.    . potassium chloride SA (K-DUR,KLOR-CON) 20 MEQ tablet Take 20 mEq by mouth 2 (two) times daily.    . SENSIPAR 90 MG tablet Take 90 mg by mouth daily.  4  . sevelamer carbonate (RENVELA) 800 MG tablet Take 2,400 mg by mouth 3 (three) times daily with meals.    . nitroGLYCERIN (NITROSTAT) 0.4 MG SL tablet Place 0.4 mg under the tongue every 5 (five) minutes as needed for chest pain.      Home: Home Living Family/patient expects to be discharged to:: Private  residence Living Arrangements: Alone Available Help at Discharge: Family, Available PRN/intermittently Type of Home: House Home Access: Stairs to enter Technical brewer of Steps: 3 Entrance Stairs-Rails: Can reach both [place]: One level Bathroom Shower/Tub: Tub/shower unit  Functional History: Prior Function Level of Independence: Independent Comments: pt was attending silver sneakers and was able to drive and grocery shop Functional Status:  Mobility: Bed Mobility Overal bed mobility: Needs Assistance Bed Mobility: Supine to Sit Supine to sit: Min assist General bed mobility comments: HOB elevated, labored effort, pulled self up with hand rail, able to move LEs off EOB Transfers Overall transfer level: Needs assistance Equipment used:  (2 person lift with gait belt) Transfers: Sit to/from Stand Sit to Stand: Mod assist, +2 physical assistance General transfer comment: R knee buckling and shaking requiring PT to block knee, completed x2 Ambulation/Gait General Gait Details: limited to sidestepping x 3 to [facility], R knee buckling requiring it to be blocked    ADL:    Cognition: Cognition Overall Cognitive Status: Within Functional Limits for tasks assessed Orientation Level: Oriented X4 Cognition Arousal/Alertness: Awake/alert Behavior During Therapy:  (sleepy but awake) Overall Cognitive Status: Within Functional Limits for tasks assessed  Blood pressure 95/33, pulse 72, temperature 98.2 F (36.8 C), temperature source Oral, resp. rate 18, height 5\' 5"  (1.651 m), weight 131.9 kg (290 lb 12.6 oz), SpO2 96 %. Physical Exam  Nursing note and vitals reviewed. Constitutional: She is oriented to person, place, and time. She appears well-developed and well-nourished.  HENT:  Head: Normocephalic and atraumatic.  Eyes: Conjunctivae are normal. Pupils are equal, round, and reactive to light.  Neck: Normal range of motion. Neck supple.  Cardiovascular: Normal rate and  regular rhythm.   Respiratory: Effort normal and breath sounds normal. No respiratory distress. She has no wheezes.  GI: Soft. Bowel sounds are  normal. She exhibits no distension. There is no tenderness.  Musculoskeletal:  Left knee with well healed old TKR incision.  Edema LUE.  Neurological: She is alert and oriented to person, place, and time.  Mild dysarthria.   Skin: Skin is warm and dry. No rash noted. No erythema.  Motor strength is 4/5 bilateral deltoid, biceps, triceps, grip, 3/5 right hip flexor knee extensor and ankle dorsiflexor, 5/5 left hip flexor and knee extensor and ankle dorsiflexor Sensation intact to light touch and proprioception in bilateral lower extremities.  Results for orders placed or performed during the hospital encounter of 12/04/14 (from the past 24 hour(s))  Glucose, capillary     Status: Abnormal   Collection Time: 12/05/14 11:27 AM  Result Value Ref Range   Glucose-Capillary 121 (H) 65 - 99 mg/dL  Glucose, capillary     Status: None   Collection Time: 12/05/14  8:35 PM  Result Value Ref Range   Glucose-Capillary 92 65 - 99 mg/dL  Lipid panel     Status: Abnormal   Collection Time: 12/06/14  6:07 AM  Result Value Ref Range   Cholesterol 91 0 - 200 mg/dL   Triglycerides 62 <150 mg/dL   HDL 40 (L) >40 mg/dL   Total CHOL/HDL Ratio 2.3 RATIO   VLDL 12 0 - 40 mg/dL   LDL Cholesterol 39 0 - 99 mg/dL   Ct Head Wo Contrast  12/04/2014  CLINICAL DATA:  Slurred speech and dizziness for 1 day. EXAM: CT HEAD WITHOUT CONTRAST TECHNIQUE: Contiguous axial images were obtained from the base of the skull through the vertex without intravenous contrast. COMPARISON:  None. FINDINGS: Sinuses/Soft tissues: Right globe resection with presumed prosthesis. Clear paranasal sinuses and mastoid air cells. Intracranial: Moderate low density in the periventricular white matter likely related to small vessel disease. Cerebral and cerebellar atrophy. The cerebellar atrophy is mildly  age advanced. No mass lesion, hemorrhage, hydrocephalus, acute infarct, intra-axial, or extra-axial fluid collection. IMPRESSION: 1.  No acute intracranial abnormality. 2. Atrophy and small vessel ischemic change. Electronically Signed   By: Abigail Miyamoto M.D.   On: 12/04/2014 13:59   Mr Jodene Nam Head Wo Contrast  12/05/2014  CLINICAL DATA:  Neurological deficits: Uncontrollable twitching RIGHT arm beginning 5 days ago, leg weakness, stuttering speech beginning today. History of end-stage renal disease on dialysis, CHF, hypertension, RIGHT eye blindness, diabetes. EXAM: MRI HEAD WITHOUT CONTRAST MRA HEAD WITHOUT CONTRAST TECHNIQUE: Multiplanar, multiecho pulse sequences of the brain and surrounding structures were obtained without intravenous contrast. Angiographic images of the head were obtained using MRA technique without contrast. Coronal diffusion weighted imaging not able to be obtained. COMPARISON:  CT head December 04, 2014 FINDINGS: MRI HEAD FINDINGS Mildly motion degraded examination. No reduced diffusion to suggest acute ischemia. No susceptibility artifact to suggest hemorrhage. Ventricles and sulci are normal for patient's age. Old small RIGHT cerebellar infarct. Old RIGHT thalamus and bilateral basal ganglia lacunar infarcts. Patchy to confluent supratentorial and pontine white matter T2 hyperintensities without midline shift, mass effect or mass lesions. No abnormal extra-axial fluid collections. RIGHT ocular globe prosthesis. Status post LEFT ocular lens implant. Mild paranasal sinus mucosal thickening without air-fluid levels. Mastoid air cells are well aerated. Mildly expanded sella move most compatible with partially empty sella. No cerebellar tonsillar ectopia. Heterogeneous bone marrow signal, suggesting renal osteodystrophy in patient with history of end-stage renal disease. Patient is edentulous. MRA HEAD FINDINGS Moderate to severely motion degraded examination results in duplicated appearance  of the intracranial vessels. Flow  related enhancement within the cervical, petrous, cavernous and supraclinoid internal carotid arteries. LEFT A1 segment is dominant with flow related enhancement within the anterior and middle cerebral arteries. Flow related enhancement within the vertebral arteries, basilar artery and posterior cerebral arteries. Small apparent bilateral posterior communicating arteries present. No acute large vessel occlusion. No high-grade stenosis, limited assessment for moderate low grade stenosis, move limited assessment for aneurysm due to motion. IMPRESSION: MRI HEAD: No acute intracranial process on this mildly motion degraded examination. Chronic changes including old bilateral basal ganglia, RIGHT thalamus lacunar infarcts and old small RIGHT cerebellar infarct. Moderate to severe chronic small vessel ischemic disease. MRA HEAD: Motion degraded examination without large vessel occlusion or high-grade stenosis. Electronically Signed   By: Elon Alas M.D.   On: 12/05/2014 22:48   Mr Brain Wo Contrast  12/05/2014  CLINICAL DATA:  Neurological deficits: Uncontrollable twitching RIGHT arm beginning 5 days ago, leg weakness, stuttering speech beginning today. History of end-stage renal disease on dialysis, CHF, hypertension, RIGHT eye blindness, diabetes. EXAM: MRI HEAD WITHOUT CONTRAST MRA HEAD WITHOUT CONTRAST TECHNIQUE: Multiplanar, multiecho pulse sequences of the brain and surrounding structures were obtained without intravenous contrast. Angiographic images of the head were obtained using MRA technique without contrast. Coronal diffusion weighted imaging not able to be obtained. COMPARISON:  CT head December 04, 2014 FINDINGS: MRI HEAD FINDINGS Mildly motion degraded examination. No reduced diffusion to suggest acute ischemia. No susceptibility artifact to suggest hemorrhage. Ventricles and sulci are normal for patient's age. Old small RIGHT cerebellar infarct. Old RIGHT  thalamus and bilateral basal ganglia lacunar infarcts. Patchy to confluent supratentorial and pontine white matter T2 hyperintensities without midline shift, mass effect or mass lesions. No abnormal extra-axial fluid collections. RIGHT ocular globe prosthesis. Status post LEFT ocular lens implant. Mild paranasal sinus mucosal thickening without air-fluid levels. Mastoid air cells are well aerated. Mildly expanded sella move most compatible with partially empty sella. No cerebellar tonsillar ectopia. Heterogeneous bone marrow signal, suggesting renal osteodystrophy in patient with history of end-stage renal disease. Patient is edentulous. MRA HEAD FINDINGS Moderate to severely motion degraded examination results in duplicated appearance of the intracranial vessels. Flow related enhancement within the cervical, petrous, cavernous and supraclinoid internal carotid arteries. LEFT A1 segment is dominant with flow related enhancement within the anterior and middle cerebral arteries. Flow related enhancement within the vertebral arteries, basilar artery and posterior cerebral arteries. Small apparent bilateral posterior communicating arteries present. No acute large vessel occlusion. No high-grade stenosis, limited assessment for moderate low grade stenosis, move limited assessment for aneurysm due to motion. IMPRESSION: MRI HEAD: No acute intracranial process on this mildly motion degraded examination. Chronic changes including old bilateral basal ganglia, RIGHT thalamus lacunar infarcts and old small RIGHT cerebellar infarct. Moderate to severe chronic small vessel ischemic disease. MRA HEAD: Motion degraded examination without large vessel occlusion or high-grade stenosis. Electronically Signed   By: Elon Alas M.D.   On: 12/05/2014 22:48    Assessment/Plan: Diagnosis: Chronic right hemiparesis mainly in the lower extremity due to remote stroke with resolving encephalopathy related to Lyrica 1. Does the need  for close, 24 hr/day medical supervision in concert with the patient's rehab needs make it unreasonable for this patient to be served in a less intensive setting? No 2. Co-Morbidities requiring supervision/potential complications: Not applicable 3. Due to not applicable, does the patient require 24 hr/day rehab nursing? No 4. Does the patient require coordinated care of a physician, rehab nurse, PT and OT to address physical  and functional deficits in the context of the above medical diagnosis(es)? No Addressing deficits in the following areas: balance, endurance and locomotion 5. Can the patient actively participate in an intensive therapy program of at least 3 hrs of therapy per day at least 5 days per week? N/A 6. The potential for patient to make measurable gains while on inpatient rehab is not applicable 7. Anticipated functional outcomes upon discharge from inpatient rehab are n/a  with PT, n/a with OT, n/a with SLP. 8. Estimated rehab length of stay to reach the above functional goals is: Not applicable 9. Does the patient have adequate social supports and living environment to accommodate these discharge functional goals? N/A 10. Anticipated D/C setting: Home 11. Anticipated post D/C treatments: [place] therapy 12. Overall Rehab/Functional Prognosis: excellent  RECOMMENDATIONS: This patient's condition is appropriate for continued rehabilitative care in the following setting: [facility] Therapy Patient has agreed to participate in recommended program. Yes Note that insurance prior authorization may be required for reimbursement for recommended care.  Comment: Patient was able to go from bed to chair with supervision    12/06/2014

## 2014-12-06 NOTE — Progress Notes (Signed)
Subjective: No new neurological symptoms. Patient is more alert this AM.  EEG was done this morning which did not show any abnormal epileptiform activity. No significant background slowing is noted.  Exam: Filed Vitals:   12/06/14 0551  BP: 95/33  Pulse: 72  Temp: 98.2 F (36.8 C)  Resp: 18    HEENT-  Normocephalic, no lesions, without obvious abnormality.  Normal external eye and conjunctiva.  Normal TM's bilaterally.  Normal auditory canals and external ears. Normal external nose, mucus membranes and septum.  Normal pharynx. Cardiovascular- S1, S2 normal, pulses palpable throughout   Lungs- chest clear, no wheezing, rales, normal symmetric air entry Abdomen- normal findings: bowel sounds normal Gen: In bed, NAD MS: alert and oriented, following commands with no difficulty CN: left pupil reactive and full EOMI, right eye is prosthetic.  Motor: Moving all extremities with full motor strength. Asterixis is significantly improved, only minimal residual asterixis noted.  Sensory: stable with baseline length dependent sensory neuropathy  Pertinent Labs:  MRI HEAD: No acute intracranial process on this mildly motion degraded examination.  Chronic changes including old bilateral basal ganglia, RIGHT thalamus lacunar infarcts and old small RIGHT cerebellar infarct. Moderate to severe chronic small vessel ischemic disease.  MRA HEAD: Motion degraded examination without large vessel occlusion or high-grade stenosis.  EEG showed no epileptiform activity.    Impression: 69 YO female with metabolic encephalopathy which has improved after HD and stopping Lyrica/neurontin. MRI brain and EEG showed no acute pathology.    Recommendations: 1. Avoid lyrica/gabapentin, minimize use of any neuro/pain meds.  2. PT consult, for gait training 3. Continue Aspirin - dose 325 daily  Neurology will sign off. please call for any further questions.     12/06/2014, 9:34 AM

## 2014-12-06 NOTE — Procedures (Signed)
History: 69 yo F with encephalopathy  Sedation: None  Technique: This is a 19 channel routine scalp EEG performed at the bedside with bipolar and monopolar montages arranged in accordance to the international 10/20 system of electrode placement. One channel was dedicated to EKG recording.    Background: The background consists of intermixed alpha and beta activities. There is a well defined posterior dominant rhythm of 9 Hz that attenuates with eye opening. With drowsiness there is anterior shifting of the PDR. Marland [name]   Photic stimulation: Physiologic driving is not present  EEG Abnormalities: None  Clinical Interpretation: This normal EEG is recorded in the waking and drowsy state. There was no seizure or seizure predisposition recorded on this study.   Roland Rack, MD [facility] [phone]  If 7pm- 7am, please page neurology on call as listed in [place].

## 2014-12-06 NOTE — Progress Notes (Addendum)
[facility] Progress Note  Assessment: 1. Altered mental status / asterixis - gradually improving, close to baseline today. Consistent with toxicity from Neurontin+ added sample Lyrica 2. ESRD - TTS HD 3. Hypotension - usually high on 4 BP meds at home, all on hold. Unclear cause, over dry by 3-4kg, echo normal LV/RV, not septic. Will plan minimal UF w HD tomorrow, raise dry wt possibly.  4. Anemia - Hgb 11.0 Not on ESA. Follow CBC.  5. Metabolic bone disease - Continue renvela and sensipar per OP. C Ca 11.2. Hold Calcitriol tonight. Reduce dose to 1.75 mcg on DC.  6. Nutrition - Renal diet with fld restrictions. Add vit/nepro. Albumin 2.9 7. DM: per Primary S/S  Plan - HD Thursday, watch BP. No UF w HD tomorrow (unless BP better). Standing st's and BP's.   Kelly Splinter MD [facility] pager [phone]    cell [phone] 12/06/2014, 10:55 AM     Subjective:  Doing  Better, understands that she was prescribed two of the same medication class and that is likely what caused her to get sick  Objective Filed Vitals:   12/05/14 2028 12/05/14 2037 12/06/14 0551 12/06/14 0754  BP:  86/32 95/33   Pulse: 78 71 72   Temp:  97.8 F (36.6 C) 98.2 F (36.8 C)   TempSrc:  Oral Oral   Resp: 18 17 18    Height:      Weight:  131.9 kg (290 lb 12.6 oz)    SpO2: 94% 96% 97% 96%   Physical Exam General: Well nourished, pleasant, NAD.  Heart:RRR with S1 S2. No murmurs, rubs, or gallops appreciated. Lungs: Bilateral breath sounds CTA AP  Abdomen: soft, active BS Extremities: Ne LE edema Dialysis Access: LUA AVF + thrill + Bruit  Dialysis Orders: Center: [facility] on [address] . EDW 127.5 kg HD Bath 1.0 K 2.25 Ca Time 4 hours 15 minutes Heparin 13000 units per treatment. Access LUA AVF BFR 400 DFR 800  Calcitriol 2.5 mg PO Q TTS (Last dose 11/12/216) Additional Objective Labs: Basic Metabolic Panel:  Recent Labs Lab 12/04/14 1222  12/04/14 1258 12/04/14 2045 12/04/14 2153 12/05/14 0544  NA 138 137 138  --  137  K 6.6* 6.2* 7.2* 3.8 4.5  CL 100* 102 100*  --  97*  CO2 26  --  23  --  30  GLUCOSE 131* 125* 72  --  135*  BUN 59* 57* 64*  --  27*  CREATININE 10.56* 9.90* 10.89*  --  6.56*  CALCIUM 10.8*  --  10.1  --  9.1  PHOS  --   --  4.7*  --   --    Liver Function Tests:  Recent Labs Lab 12/04/14 1222 12/04/14 2045 12/05/14 0544  AST 23  --  28  ALT 15  --  12*  ALKPHOS 41  --  37*  BILITOT 0.6  --  0.1*  PROT 7.5  --  6.1*  ALBUMIN 3.5 3.3* 2.9*   No results for input(s): LIPASE, AMYLASE in the last 168 hours. CBC:  Recent Labs Lab 12/04/14 1222 12/04/14 1258 12/04/14 2153  WBC 7.7  --  8.6  NEUTROABS 4.2  --   --   HGB 11.2* 13.3 11.0*  HCT 36.1 39.0 34.6*  MCV 96.3  --  94.5  PLT 179  --  178   Blood Culture    Component Value Date/Time   SDES BLOOD RIGHT WRIST 05/07/2012 0545  SPECREQUEST BOTTLES DRAWN AEROBIC AND ANAEROBIC 10CC 05/07/2012 0545   CULT NO GROWTH 5 DAYS 05/07/2012 0545   REPTSTATUS 05/13/2012 FINAL 05/07/2012 0545    Cardiac Enzymes: No results for input(s): CKTOTAL, CKMB, CKMBINDEX, TROPONINI in the last 168 hours. CBG:  Recent Labs Lab 12/05/14 0108 12/05/14 0743 12/05/14 1127 12/05/14 2035 12/06/14 0800  GLUCAP 155* 110* 121* 92 95   Iron Studies: No results for input(s): IRON, TIBC, TRANSFERRIN, FERRITIN in the last 72 hours. @lablastinr3 @ Studies/Results: Ct Head Wo Contrast  12/04/2014  CLINICAL DATA:  Slurred speech and dizziness for 1 day. EXAM: CT HEAD WITHOUT CONTRAST TECHNIQUE: Contiguous axial images were obtained from the base of the skull through the vertex without intravenous contrast. COMPARISON:  None. FINDINGS: Sinuses/Soft tissues: Right globe resection with presumed prosthesis. Clear paranasal sinuses and mastoid air cells. Intracranial: Moderate low density in the periventricular white matter likely related to small vessel disease.  Cerebral and cerebellar atrophy. The cerebellar atrophy is mildly age advanced. No mass lesion, hemorrhage, hydrocephalus, acute infarct, intra-axial, or extra-axial fluid collection. IMPRESSION: 1.  No acute intracranial abnormality. 2. Atrophy and small vessel ischemic change. Electronically Signed   By: Abigail Miyamoto M.D.   On: 12/04/2014 13:59   Mr Jodene Nam Head Wo Contrast  12/05/2014  CLINICAL DATA:  Neurological deficits: Uncontrollable twitching RIGHT arm beginning 5 days ago, leg weakness, stuttering speech beginning today. History of end-stage renal disease on dialysis, CHF, hypertension, RIGHT eye blindness, diabetes. EXAM: MRI HEAD WITHOUT CONTRAST MRA HEAD WITHOUT CONTRAST TECHNIQUE: Multiplanar, multiecho pulse sequences of the brain and surrounding structures were obtained without intravenous contrast. Angiographic images of the head were obtained using MRA technique without contrast. Coronal diffusion weighted imaging not able to be obtained. COMPARISON:  CT head December 04, 2014 FINDINGS: MRI HEAD FINDINGS Mildly motion degraded examination. No reduced diffusion to suggest acute ischemia. No susceptibility artifact to suggest hemorrhage. Ventricles and sulci are normal for patient's age. Old small RIGHT cerebellar infarct. Old RIGHT thalamus and bilateral basal ganglia lacunar infarcts. Patchy to confluent supratentorial and pontine white matter T2 hyperintensities without midline shift, mass effect or mass lesions. No abnormal extra-axial fluid collections. RIGHT ocular globe prosthesis. Status post LEFT ocular lens implant. Mild paranasal sinus mucosal thickening without air-fluid levels. Mastoid air cells are well aerated. Mildly expanded sella move most compatible with partially empty sella. No cerebellar tonsillar ectopia. Heterogeneous bone marrow signal, suggesting renal osteodystrophy in patient with history of end-stage renal disease. Patient is edentulous. MRA HEAD FINDINGS Moderate to  severely motion degraded examination results in duplicated appearance of the intracranial vessels. Flow related enhancement within the cervical, petrous, cavernous and supraclinoid internal carotid arteries. LEFT A1 segment is dominant with flow related enhancement within the anterior and middle cerebral arteries. Flow related enhancement within the vertebral arteries, basilar artery and posterior cerebral arteries. Small apparent bilateral posterior communicating arteries present. No acute large vessel occlusion. No high-grade stenosis, limited assessment for moderate low grade stenosis, move limited assessment for aneurysm due to motion. IMPRESSION: MRI HEAD: No acute intracranial process on this mildly motion degraded examination. Chronic changes including old bilateral basal ganglia, RIGHT thalamus lacunar infarcts and old small RIGHT cerebellar infarct. Moderate to severe chronic small vessel ischemic disease. MRA HEAD: Motion degraded examination without large vessel occlusion or high-grade stenosis. Electronically Signed   By: Elon Alas M.D.   On: 12/05/2014 22:48   Mr Brain Wo Contrast  12/05/2014  CLINICAL DATA:  Neurological deficits: Uncontrollable twitching RIGHT arm beginning  5 days ago, leg weakness, stuttering speech beginning today. History of end-stage renal disease on dialysis, CHF, hypertension, RIGHT eye blindness, diabetes. EXAM: MRI HEAD WITHOUT CONTRAST MRA HEAD WITHOUT CONTRAST TECHNIQUE: Multiplanar, multiecho pulse sequences of the brain and surrounding structures were obtained without intravenous contrast. Angiographic images of the head were obtained using MRA technique without contrast. Coronal diffusion weighted imaging not able to be obtained. COMPARISON:  CT head December 04, 2014 FINDINGS: MRI HEAD FINDINGS Mildly motion degraded examination. No reduced diffusion to suggest acute ischemia. No susceptibility artifact to suggest hemorrhage. Ventricles and sulci are normal  for patient's age. Old small RIGHT cerebellar infarct. Old RIGHT thalamus and bilateral basal ganglia lacunar infarcts. Patchy to confluent supratentorial and pontine white matter T2 hyperintensities without midline shift, mass effect or mass lesions. No abnormal extra-axial fluid collections. RIGHT ocular globe prosthesis. Status post LEFT ocular lens implant. Mild paranasal sinus mucosal thickening without air-fluid levels. Mastoid air cells are well aerated. Mildly expanded sella move most compatible with partially empty sella. No cerebellar tonsillar ectopia. Heterogeneous bone marrow signal, suggesting renal osteodystrophy in patient with history of end-stage renal disease. Patient is edentulous. MRA HEAD FINDINGS Moderate to severely motion degraded examination results in duplicated appearance of the intracranial vessels. Flow related enhancement within the cervical, petrous, cavernous and supraclinoid internal carotid arteries. LEFT A1 segment is dominant with flow related enhancement within the anterior and middle cerebral arteries. Flow related enhancement within the vertebral arteries, basilar artery and posterior cerebral arteries. Small apparent bilateral posterior communicating arteries present. No acute large vessel occlusion. No high-grade stenosis, limited assessment for moderate low grade stenosis, move limited assessment for aneurysm due to motion. IMPRESSION: MRI HEAD: No acute intracranial process on this mildly motion degraded examination. Chronic changes including old bilateral basal ganglia, RIGHT thalamus lacunar infarcts and old small RIGHT cerebellar infarct. Moderate to severe chronic small vessel ischemic disease. MRA HEAD: Motion degraded examination without large vessel occlusion or high-grade stenosis. Electronically Signed   By: Elon Alas M.D.   On: 12/05/2014 22:48   Medications:   . aspirin EC  81 mg Oral Daily  . cinacalcet  90 mg Oral Q breakfast  . heparin  5,000  Units Subcutaneous 3 times per day  . insulin aspart  0-9 Units Subcutaneous TID WC  . ipratropium-albuterol  3 mL Nebulization TID  . multivitamin with minerals  1 tablet Oral QHS  . sevelamer carbonate  2,400 mg Oral TID WC  . sodium chloride  250 mL Intravenous Once  . sodium chloride  3 mL Intravenous Q12H  . sodium chloride  3 mL Intravenous Q12H

## 2014-12-06 NOTE — Progress Notes (Signed)
Rehab admissions - Please see rehab consult done today by Dr. Letta Pate recommending [facility] therapy for followup.  Patient is already doing too well to meet criteria for acute inpatient rehab admission.  Agree with HH therapies.  [id number]

## 2014-12-06 NOTE — Progress Notes (Signed)
Speech Language Pathology Discharge Patient Details Name: [name] MRN: [id number] DOB: 13-Aug-1945 Today's Date: 12/06/2014 Time:  -     Patient discharged from SLP services secondary to:  Pt's swallow assessment completed yesterday with no ST needs identified. MD reordered swallow assessment  yesterday afternoon. SLP text paged MD assigned to pt today to update on pt's swallow status and repeat BSE will not be performed unless MD requests..  Please see latest therapy progress note for current level of functioning and progress toward goals.    Progress and discharge plan discussed with patient and/or caregiver:       Houston Siren 12/06/2014, 1:54 PM   Orbie Pyo Colvin Caroli.Ed [employer] [phone]

## 2014-12-06 NOTE — Progress Notes (Signed)
Inpatient Rehabilitation  Received the prescreen request.  Note that order has been written for Inpatient Rehabilitation consult.  Plan for Admission Coordinator to follow up once consult has been completed.   [facility] Admissions Coordinator Cell [phone] Office [phone]

## 2014-12-06 NOTE — Progress Notes (Signed)
Physical Therapy Treatment Patient Details Name: [name] MRN: [id number] DOB: [birth year] Today's Date: 12/06/2014    History of Present Illness [name] is a 69 y.o. female, with coronary artery disease, CHF, diabetes with neuropathy, in stage renal disease (dialyzes TTS) who presents to the emergency department today with neurological deficits. According to her family the patient developed uncontrollable twitching in her right arm on Saturday. On Sunday she developed weakness in her legs and her daughter noticed she was leaning to one side. [facility] [employer] and developed st[facility] speech. She is complaining of unbalanced gait, and significant dyspnea on exertion. Awaiting stroke work up.    PT Comments    Making excellent progress. Ambulates up to 225 feet with supervision for safety and use of a rolling walker, minimally, for support. Safely navigates stairs without physical assist. Does demonstrate some Rt knee instability however no buckling today. Tolerated exercises well. Updated d/c recommendations and functional goals. Patient will continue to benefit from skilled physical therapy services to further improve independence with functional mobility.   Follow Up Recommendations  Home health PT;Supervision for mobility/OOB     Equipment Recommendations  None recommended by PT (has RW and rollator)    Recommendations for Other Services OT consult     Precautions / Restrictions Precautions Precautions: Fall Restrictions Weight Bearing Restrictions: No    Mobility  Bed Mobility               General bed mobility comments: Sitting in recliner  Transfers Overall transfer level: Needs assistance Equipment used: Rolling walker (2 wheeled) Transfers: Sit to/from Stand           General transfer comment: Supervision for safety. No buckling of Rt knee noted although some instability with hyperextension. VC for hand placement and  safety when rising from recliner.  Ambulation/Gait Ambulation/Gait assistance: Supervision Ambulation Distance (Feet): 225 Feet Assistive device: Rolling walker (2 wheeled) Gait Pattern/deviations: Step-through pattern (Rt knee hyperextension)   Gait velocity interpretation: at or above normal speed for age/gender General Gait Details: Educated on safe DME use with a rolling walker for support, which was also adjusted appropriately for height. Slight hyperextension of Rt knee in terminal stance phase of gait on Rt. No buckling. VC for safety and to slow down at times. Pt with 2/4 dyspnea. Unable to obtain accurate SpO2, asked RN to check this afternoon.   Stairs Stairs: Yes Stairs assistance: Supervision Stair Management: Two rails;Step to pattern;Forwards Number of Stairs: 6 General stair comments: Educated on safe stair negotiation with cues for sequencing. Performed safely with use of rails for support, similar to home environment.  Wheelchair Mobility    Modified Rankin (Stroke Patients Only) Modified Rankin (Stroke Patients Only) Pre-Morbid Rankin Score: No symptoms Modified Rankin: Moderate disability     Balance Overall balance assessment: Needs assistance Sitting-balance support: No upper extremity supported;Feet supported Sitting balance-Leahy Scale: Good     Standing balance support: No upper extremity supported Standing balance-Leahy Scale: Fair                      Cognition Arousal/Alertness: Awake/alert Behavior During Therapy: WFL for tasks assessed/performed Overall Cognitive Status: Within Functional Limits for tasks assessed                      Exercises General Exercises - Lower Extremity Ankle Circles/Pumps: AROM;Both;10 reps;Seated Long Arc Quad: Strengthening;Right;15 reps;Seated Heel Slides: Strengthening;Right;10 reps;Seated (with manual resistance)  Hip ABduction/ADduction: Strengthening;Right;10 reps;Seated Hip  Flexion/Marching: Strengthening;Right;10 reps;Seated    General Comments General comments (skin integrity, edema, etc.): DOE, resolves with sitting. Cues for pursed lip breathing and energy conservation techniques. HR in 80s. Unable to obtain SpO2 due to poor waveform.      Pertinent Vitals/Pain Pain Assessment: No/denies pain    Home Living                      Prior Function            PT Goals (current goals can now be found in the care plan section) Acute Rehab PT Goals Patient Stated Goal: home to cook thanksgiving dinner PT Goal Formulation: With patient Time For Goal Achievement: 12/19/14 Potential to Achieve Goals: Good Progress towards PT goals: Progressing toward goals    Frequency  Min 4X/week    PT Plan Discharge plan needs to be updated    Co-evaluation             End of Session Equipment Utilized During Treatment: Gait belt Activity Tolerance: Patient tolerated treatment well Patient left: with call bell/phone within reach;in chair     Time: 1135-1148 PT Time Calculation (min) (ACUTE ONLY): 13 min  Charges:  $Gait Training: 8-22 mins                    G Codes:      Ellouise Newer 12/25/14, 12:12 PM Elayne Snare, [place]

## 2014-12-07 ENCOUNTER — Inpatient Hospital Stay (HOSPITAL_COMMUNITY): Payer: Commercial Managed Care - HMO

## 2014-12-07 DIAGNOSIS — H5441 Blindness, right eye, normal vision left eye: Secondary | ICD-10-CM

## 2014-12-07 DIAGNOSIS — G9341 Metabolic encephalopathy: Secondary | ICD-10-CM

## 2014-12-07 DIAGNOSIS — R251 Tremor, unspecified: Secondary | ICD-10-CM

## 2014-12-07 DIAGNOSIS — I639 Cerebral infarction, unspecified: Secondary | ICD-10-CM

## 2014-12-07 LAB — RENAL FUNCTION PANEL
ANION GAP: 12 (ref 5–15)
Albumin: 2.9 g/dL — ABNORMAL LOW (ref 3.5–5.0)
BUN: 52 mg/dL — ABNORMAL HIGH (ref 6–20)
CALCIUM: 8.5 mg/dL — AB (ref 8.9–10.3)
CO2: 26 mmol/L (ref 22–32)
CREATININE: 9.08 mg/dL — AB (ref 0.44–1.00)
Chloride: 98 mmol/L — ABNORMAL LOW (ref 101–111)
GFR, EST AFRICAN AMERICAN: 4 mL/min — AB (ref >60)
GFR, EST NON AFRICAN AMERICAN: 4 mL/min — AB (ref >60)
Glucose, Bld: 87 mg/dL (ref 65–99)
PHOSPHORUS: 5.5 mg/dL — AB (ref 2.5–4.6)
Potassium: 5.2 mmol/L — ABNORMAL HIGH (ref 3.5–5.1)
SODIUM: 136 mmol/L (ref 135–145)

## 2014-12-07 LAB — GLUCOSE, CAPILLARY
Glucose-Capillary: 101 mg/dL — ABNORMAL HIGH (ref 65–99)
Glucose-Capillary: 131 mg/dL — ABNORMAL HIGH (ref 65–99)

## 2014-12-07 LAB — CBC
HCT: 32.9 % — ABNORMAL LOW (ref 36.0–46.0)
HEMOGLOBIN: 10.3 g/dL — AB (ref 12.0–15.0)
MCH: 29.7 pg (ref 26.0–34.0)
MCHC: 31.3 g/dL (ref 30.0–36.0)
MCV: 94.8 fL (ref 78.0–100.0)
PLATELETS: 151 10*3/uL (ref 150–400)
RBC: 3.47 MIL/uL — AB (ref 3.87–5.11)
RDW: 16.5 % — ABNORMAL HIGH (ref 11.5–15.5)
WBC: 6.2 10*3/uL (ref 4.0–10.5)

## 2014-12-07 MED ORDER — METOPROLOL SUCCINATE ER 25 MG PO TB24: 25.0000 mg | ORAL_TABLET | Freq: Every day | ORAL | Status: DC

## 2014-12-07 MED ORDER — HEPARIN SODIUM (PORCINE) 1000 UNIT/ML DIALYSIS
6500.0000 [IU] | INTRAMUSCULAR | Status: DC | PRN
Start: 2014-12-08 — End: 2014-12-07
  Filled 2014-12-07: qty 7

## 2014-12-07 MED ORDER — HEPARIN SODIUM (PORCINE) 1000 UNIT/ML DIALYSIS: 1000.0000 [IU] | INTRAMUSCULAR | Status: DC | PRN

## 2014-12-07 MED ORDER — ALTEPLASE 2 MG IJ SOLR
2.0000 mg | Freq: Once | INTRAMUSCULAR | Status: DC | PRN
  Filled 2014-12-07: qty 2

## 2014-12-07 MED ORDER — PENTAFLUOROPROP-TETRAFLUOROETH EX AERO: 1.0000 "application " | INHALATION_SPRAY | CUTANEOUS | Status: DC | PRN

## 2014-12-07 MED ORDER — SODIUM CHLORIDE 0.9 % IV SOLN: 100.0000 mL | INTRAVENOUS | Status: DC | PRN

## 2014-12-07 MED ORDER — ISOSORBIDE MONONITRATE ER 60 MG PO TB24: 30.0000 mg | ORAL_TABLET | Freq: Every day | ORAL | Status: DC

## 2014-12-07 MED ORDER — LIDOCAINE HCL (PF) 1 % IJ SOLN: 5.0000 mL | INTRAMUSCULAR | Status: DC | PRN

## 2014-12-07 MED ORDER — LIDOCAINE-PRILOCAINE 2.5-2.5 % EX CREA
1.0000 "application " | TOPICAL_CREAM | CUTANEOUS | Status: DC | PRN
  Filled 2014-12-07: qty 5

## 2014-12-07 NOTE — Progress Notes (Signed)
VASCULAR LAB PRELIMINARY  PRELIMINARY  PRELIMINARY  PRELIMINARY  Carotid duplex  completed.    Preliminary report:  Bilateral:  1-39% ICA stenosis.  Vertebral artery flow is antegrade.      Chia Rock, RVT 12/07/2014, 2:24 PM

## 2014-12-07 NOTE — Progress Notes (Signed)
[name] to be D/C'd Home per MD order.  Discussed prescriptions and follow up appointments with the patient. Prescriptions given to patient, medication list explained in detail. Pt verbalized understanding.    Medication List    STOP taking these medications        amLODipine 10 MG tablet  Commonly known as:  NORVASC     carvedilol 25 MG tablet  Commonly known as:  COREG     gabapentin 100 MG capsule  Commonly known as:  NEURONTIN     hydrALAZINE 50 MG tablet  Commonly known as:  APRESOLINE     potassium chloride SA 20 MEQ tablet  Commonly known as:  K-DUR,KLOR-CON      TAKE these medications        acetaminophen 500 MG tablet  Commonly known as:  TYLENOL  Take 1,000 mg by mouth every 6 (six) hours as needed. For pain     albuterol 108 (90 BASE) MCG/ACT inhaler  Commonly known as:  PROVENTIL HFA;VENTOLIN HFA  Inhale 2 puffs into the lungs every 4 (four) hours as needed for wheezing or shortness of breath.     aspirin EC 81 MG tablet  Take 81 mg by mouth daily.     bisacodyl 5 MG EC tablet  Commonly known as:  DULCOLAX  Take 10 mg by mouth daily as needed. For constipation     CALCIUM 600 + D PO  Take 1 tablet by mouth daily.     HYDROcodone-acetaminophen 5-325 MG tablet  Commonly known as:  NORCO/VICODIN  Take 1-2 tablets by mouth every 6 (six) hours as needed for pain.     indomethacin 50 MG capsule  Commonly known as:  INDOCIN  Take 50 mg by mouth 2 (two) times daily as needed (for gout pain).     insulin glargine 100 UNIT/ML injection  Commonly known as:  LANTUS  Inject 5-10 Units into the skin at bedtime. Patient uses sliding scale.     isosorbide mononitrate 60 MG 24 hr tablet  Commonly known as:  IMDUR  Take 0.5 tablets (30 mg total) by mouth daily.  Start taking on:  12/11/2014     metoprolol succinate 25 MG 24 hr tablet  Commonly known as:  TOPROL-XL  Take 1 tablet (25 mg total) by mouth daily.     multivitamin with minerals Tabs tablet   Take 1 tablet by mouth daily.     nitroGLYCERIN 0.4 MG SL tablet  Commonly known as:  NITROSTAT  Place 0.4 mg under the tongue every 5 (five) minutes as needed for chest pain.     SENSIPAR 90 MG tablet  Generic drug:  cinacalcet  Take 90 mg by mouth daily.     sevelamer carbonate 800 MG tablet  Commonly known as:  RENVELA  Take 2,400 mg by mouth 3 (three) times daily with meals.        Filed Vitals:   12/07/14 1000  BP: 100/52  Pulse: 72  Temp: 97.9 F (36.6 C)  Resp: 14    Skin clean, dry and intact without evidence of skin break down, no evidence of skin tears noted. IV catheter discontinued intact. Site without signs and symptoms of complications. Dressing and pressure applied. Pt denies pain at this time. No complaints noted.  An After Visit Summary was printed and given to the patient. Patient escorted via [place], and D/C home via private auto.  [name] BSN, RN

## 2014-12-07 NOTE — Progress Notes (Signed)
[facility] Progress Note  Assessment: 1. AMS/ myoclonus- completely resolved, was able to stand to weigh this am w/o difficulty. Consistent with toxicity from Neurontin+ added samples of Lyrica 2. ESRD - TTS HD 3. Hypotension - echo normal LV/RV. BP meds recently stopped due to hypotension on HD.  She is 6kg up with no edema and clear lungs, has gained body weight. Plan raise dry wt to 134kg.  4. Anemia - Hgb 11.0 Not on ESA. Follow CBC.  5. Metabolic bone disease - Continue renvela and sensipar per OP. C Ca 11.2. Hold Calcitriol tonight. Reduce dose to 1.75 mcg on DC.  6. Nutrition - Renal diet with fld restrictions. Add vit/nepro. Albumin 2.9 7. DM: per Primary S/S  Plan - HD this am.  Should be OK for dc after HD today. Raise dry weight 134kg.   Kelly Splinter MD [facility] pager [phone]    cell [phone] 12/07/2014, 9:22 AM     Subjective:  Doing better  Objective Filed Vitals:   12/07/14 0730 12/07/14 0800 12/07/14 0830 12/07/14 0900  BP: 99/43 97/29 83/48  86/49  Pulse: 62 69 73 69  Temp:      TempSrc:      Resp: 12 18 14 14   Height:      Weight:      SpO2:       Physical Exam General: Well nourished, pleasant, NAD.  Heart:RRR with S1 S2. No murmurs, rubs, or gallops appreciated. Lungs: Bilateral breath sounds CTA AP  Abdomen: soft, active BS Extremities: Ne LE edema Dialysis Access: LUA AVF + thrill + Bruit  Dialysis Orders: Center: [facility] on [facility] . EDW 127.5 kg HD Bath 1.0 K 2.25 Ca Time 4 hours 15 minutes Heparin 13000 units per treatment. Access LUA AVF BFR 400 DFR 800  Calcitriol 2.5 mg PO Q TTS (Last dose 11/12/216) Additional Objective Labs: Basic Metabolic Panel:  Recent Labs Lab 12/04/14 1222 12/04/14 1258 12/04/14 2045 12/04/14 2153 12/05/14 0544  NA 138 137 138  --  137  K 6.6* 6.2* 7.2* 3.8 4.5  CL 100* 102 100*  --  97*  CO2 26  --  23  --  30  GLUCOSE 131* 125* 72  --   135*  BUN 59* 57* 64*  --  27*  CREATININE 10.56* 9.90* 10.89*  --  6.56*  CALCIUM 10.8*  --  10.1  --  9.1  PHOS  --   --  4.7*  --   --    Liver Function Tests:  Recent Labs Lab 12/04/14 1222 12/04/14 2045 12/05/14 0544  AST 23  --  28  ALT 15  --  12*  ALKPHOS 41  --  37*  BILITOT 0.6  --  0.1*  PROT 7.5  --  6.1*  ALBUMIN 3.5 3.3* 2.9*   No results for input(s): LIPASE, AMYLASE in the last 168 hours. CBC:  Recent Labs Lab 12/04/14 1222 12/04/14 1258 12/04/14 2153  WBC 7.7  --  8.6  NEUTROABS 4.2  --   --   HGB 11.2* 13.3 11.0*  HCT 36.1 39.0 34.6*  MCV 96.3  --  94.5  PLT 179  --  178   Blood Culture    Component Value Date/Time   SDES BLOOD RIGHT WRIST 05/07/2012 0545   SPECREQUEST BOTTLES DRAWN AEROBIC AND ANAEROBIC 10CC 05/07/2012 0545   CULT NO GROWTH 5 DAYS 05/07/2012 0545   REPTSTATUS 05/13/2012 FINAL 05/07/2012 0545  Cardiac Enzymes: No results for input(s): CKTOTAL, CKMB, CKMBINDEX, TROPONINI in the last 168 hours. CBG:  Recent Labs Lab 12/05/14 2035 12/06/14 0800 12/06/14 1135 12/06/14 1647 12/06/14 2100  GLUCAP 92 95 127* 153* 185*   Iron Studies: No results for input(s): IRON, TIBC, TRANSFERRIN, FERRITIN in the last 72 hours. @lablastinr3 @ Studies/Results: Mr Virgel Paling Wo Contrast  12/05/2014  CLINICAL DATA:  Neurological deficits: Uncontrollable twitching RIGHT arm beginning 5 days ago, leg weakness, stuttering speech beginning today. History of end-stage renal disease on dialysis, CHF, hypertension, RIGHT eye blindness, diabetes. EXAM: MRI HEAD WITHOUT CONTRAST MRA HEAD WITHOUT CONTRAST TECHNIQUE: Multiplanar, multiecho pulse sequences of the brain and surrounding structures were obtained without intravenous contrast. Angiographic images of the head were obtained using MRA technique without contrast. Coronal diffusion weighted imaging not able to be obtained. COMPARISON:  CT head December 04, 2014 FINDINGS: MRI HEAD FINDINGS Mildly  motion degraded examination. No reduced diffusion to suggest acute ischemia. No susceptibility artifact to suggest hemorrhage. Ventricles and sulci are normal for patient's age. Old small RIGHT cerebellar infarct. Old RIGHT thalamus and bilateral basal ganglia lacunar infarcts. Patchy to confluent supratentorial and pontine white matter T2 hyperintensities without midline shift, mass effect or mass lesions. No abnormal extra-axial fluid collections. RIGHT ocular globe prosthesis. Status post LEFT ocular lens implant. Mild paranasal sinus mucosal thickening without air-fluid levels. Mastoid air cells are well aerated. Mildly expanded sella move most compatible with partially empty sella. No cerebellar tonsillar ectopia. Heterogeneous bone marrow signal, suggesting renal osteodystrophy in patient with history of end-stage renal disease. Patient is edentulous. MRA HEAD FINDINGS Moderate to severely motion degraded examination results in duplicated appearance of the intracranial vessels. Flow related enhancement within the cervical, petrous, cavernous and supraclinoid internal carotid arteries. LEFT A1 segment is dominant with flow related enhancement within the anterior and middle cerebral arteries. Flow related enhancement within the vertebral arteries, basilar artery and posterior cerebral arteries. Small apparent bilateral posterior communicating arteries present. No acute large vessel occlusion. No high-grade stenosis, limited assessment for moderate low grade stenosis, move limited assessment for aneurysm due to motion. IMPRESSION: MRI HEAD: No acute intracranial process on this mildly motion degraded examination. Chronic changes including old bilateral basal ganglia, RIGHT thalamus lacunar infarcts and old small RIGHT cerebellar infarct. Moderate to severe chronic small vessel ischemic disease. MRA HEAD: Motion degraded examination without large vessel occlusion or high-grade stenosis. Electronically Signed   By:  Elon Alas M.D.   On: 12/05/2014 22:48   Mr Brain Wo Contrast  12/05/2014  CLINICAL DATA:  Neurological deficits: Uncontrollable twitching RIGHT arm beginning 5 days ago, leg weakness, stuttering speech beginning today. History of end-stage renal disease on dialysis, CHF, hypertension, RIGHT eye blindness, diabetes. EXAM: MRI HEAD WITHOUT CONTRAST MRA HEAD WITHOUT CONTRAST TECHNIQUE: Multiplanar, multiecho pulse sequences of the brain and surrounding structures were obtained without intravenous contrast. Angiographic images of the head were obtained using MRA technique without contrast. Coronal diffusion weighted imaging not able to be obtained. COMPARISON:  CT head December 04, 2014 FINDINGS: MRI HEAD FINDINGS Mildly motion degraded examination. No reduced diffusion to suggest acute ischemia. No susceptibility artifact to suggest hemorrhage. Ventricles and sulci are normal for patient's age. Old small RIGHT cerebellar infarct. Old RIGHT thalamus and bilateral basal ganglia lacunar infarcts. Patchy to confluent supratentorial and pontine white matter T2 hyperintensities without midline shift, mass effect or mass lesions. No abnormal extra-axial fluid collections. RIGHT ocular globe prosthesis. Status post LEFT ocular lens implant. Mild paranasal sinus mucosal  thickening without air-fluid levels. Mastoid air cells are well aerated. Mildly expanded sella move most compatible with partially empty sella. No cerebellar tonsillar ectopia. Heterogeneous bone marrow signal, suggesting renal osteodystrophy in patient with history of end-stage renal disease. Patient is edentulous. MRA HEAD FINDINGS Moderate to severely motion degraded examination results in duplicated appearance of the intracranial vessels. Flow related enhancement within the cervical, petrous, cavernous and supraclinoid internal carotid arteries. LEFT A1 segment is dominant with flow related enhancement within the anterior and middle cerebral  arteries. Flow related enhancement within the vertebral arteries, basilar artery and posterior cerebral arteries. Small apparent bilateral posterior communicating arteries present. No acute large vessel occlusion. No high-grade stenosis, limited assessment for moderate low grade stenosis, move limited assessment for aneurysm due to motion. IMPRESSION: MRI HEAD: No acute intracranial process on this mildly motion degraded examination. Chronic changes including old bilateral basal ganglia, RIGHT thalamus lacunar infarcts and old small RIGHT cerebellar infarct. Moderate to severe chronic small vessel ischemic disease. MRA HEAD: Motion degraded examination without large vessel occlusion or high-grade stenosis. Electronically Signed   By: Elon Alas M.D.   On: 12/05/2014 22:48   Medications:   . aspirin EC  81 mg Oral Daily  . cinacalcet  90 mg Oral Q breakfast  . heparin  5,000 Units Subcutaneous 3 times per day  . insulin aspart  0-9 Units Subcutaneous TID WC  . ipratropium-albuterol  3 mL Nebulization TID  . multivitamin with minerals  1 tablet Oral QHS  . sevelamer carbonate  2,400 mg Oral TID WC  . sodium chloride  250 mL Intravenous Once  . sodium chloride  3 mL Intravenous Q12H  . sodium chloride  3 mL Intravenous Q12H

## 2014-12-07 NOTE — Care Management Important Message (Signed)
Important Message  Patient Details  Name: AMMARAH MYSZKA MRN: [id number] Date of Birth: 1945/12/03   Medicare Important Message Given:  Yes    [name] Millhousen 12/07/2014, 12:20 PM

## 2014-12-07 NOTE — Discharge Summary (Signed)
Physician Discharge Summary  [name] [id number] DOB: [birth year] DOA: 12/04/2014  PCP: [name], MD  Admit date: 12/04/2014 Discharge date: 12/07/2014  Time spent: 35 minutes  Recommendations for Outpatient Follow-up:  1. Repeat BMET to follow electrolytes  2. Reassess BP and adjust antihypertensive regimen as needed   Discharge Diagnoses:  Principal Problem:   Neurological deficit present Active Problems:   End stage renal disease (HCC)   Hyperkalemia   Acute hyperkalemia   OSA on CPAP   Hypercalcemia   Weakness   Encephalopathy, metabolic   Discharge Condition: stable and improved. Discharge home with home health services for PT/OT. Will follow up with PCP in 2 weeks. No more neurontin and/or lyrica  Diet recommendation: heart healthy/low carbohydrates  Filed Weights   12/07/14 0438 12/07/14 0708 12/07/14 1000  Weight: 131.9 kg (290 lb 12.6 oz) 133.6 kg (294 lb 8.6 oz) 133.1 kg (293 lb 6.9 oz)    History of present illness:  69 y.o. female, with coronary artery disease, CHF, diabetes with neuropathy, in stage renal disease (dialyzes TTS) who presents to the emergency department today with neurological deficits. According to her family the patient developed uncontrollable twitching in her right arm on Saturday. On Sunday she developed weakness in her legs and her daughter noticed she was leaning to one side. [facility] and [facility] speech. She is complaining of unbalanced gait, and significant dyspnea on exertion. The patient's aide reports that last Monday she saw her PCP for burning in her feet and was given Lyrica samples. She takes 3 pills each night at bedtime. According to the med list she is also on gabapentin once daily. The patient has never had symptoms like this in the past. She does make some urine and has pain on urination. Her last dialysis was Saturday. She completed the entire dialysis  session.  Hospital Course:  Neurological Deficits/metabolic encephalopathy: Presents with tremors, weakness on legs and off balance. Most likely toxicity from neurontin along with lyrica and electrolytes abnormalities  -MRI neg for stroke  -Neuro consulted and recommending stop neurontin, lyrica and pain meds -patient much better already -per PT and CIR rec's will discharge home with  [facility] services -EEG w/o epileptic abnormalities   HTN: currently with hypotension -Will resume low dose metoprolol and low dose imdur; rest of antihypertensive agent discotninued -patient is asymptomatic and denies lightheadedness   End-stage renal disease:  -Patient underwent emergent dialysis on 11-14 for hyperkalemia.  -usual schedule Tuesday/Thursday/Saturday. -resume outpatient schedule prior to discharge -next treatment 11/19  Acute Hyperkalemia/hypercalcemia -Resolved with HD.  -potassium supplementation discontinued   Diabetes mellitus with peripheral vascular disease -will resume home hypoglycemic regimen  -A1C 6.4 -advise to watch amount of carbohydrates in her diet  Obstructive sleep apnea Patient is not on CPAP.  Currently using PRN night time oxygen  History of coronary artery disease and diastolic heart failure -Actively follow by Dr. Doylene Canard. Echo results as mentioned below; but essentially unchanged from 2010  -no CP or SOB -advise to follow low sodium diet  Anemia of chronic disease: due to renal failure -ESA and IV iron as per renal discretions -Hgb stable  Procedures:  EEG: no signs of epileptiforms  2-D echo: Left ventricle:  thickness was increased in a pattern of moderate LVH. Systolic function was normal. The estimated ejection fraction was in the range of 60% to 65%. Wall motion was normal; there were no regional wall motion abnormalities. Doppler parameters are consistent  with abnormal left ventricular relaxation (grade 1 diastolic dysfunction).  Doppler parameters are consistent with high ventricular filling pressure. - Aortic valve: Sclerosis without stenosis. Peak velocity (S): 223 cm/s. Mean gradient (S): 10 mm Hg. Valve area (VTI): 2.65 cm^2. Valve area (Vmax): 2.3 cm^2. Valve area (Vmean): 2.4 cm^2. - Mitral valve: Severely calcified annulus. Mobility of the posterior leaflet was restricted. The findings are consistent with mild stenosis. Mean gradient (D): 5 mm Hg. - Left atrium: The atrium was severely dilated. - Right ventricle: The cavity size was normal. Wall thickness was normal. Systolic function was normal. - Inferior vena cava: The vessel was normal in size. The respirophasic diameter changes were in the normal range (>= 50%), consistent with normal central venous pressure.   Carotid dopplers: Bilateral: 1-39% ICA stenosis. Vertebral artery flow is antegrade.   Consultations:  Neurology  Renal service  CIR   Discharge Exam: Filed Vitals:   12/07/14 1000  BP: 100/52  Pulse: 72  Temp: 97.9 F (36.6 C)  Resp: 14    General: afebrile, no CP, no SOB and with no further tremors appreciated on exam Cardiovascular: S1 and s2, no rubs or gallops Respiratory: CTA bilaterally Abd: soft, NT, ND, positive BS Ext: no edema, no cyanosis   Discharge Instructions   Discharge Instructions    Diet - low sodium heart healthy    Complete by:  As directed      Discharge instructions    Complete by:  As directed   Take medications as prescribed Follow low sodium heart healthy diet Watch your intake of carbohydrates Maintain adequate hydration  Arrange follow up with PCP in 2 weeks          Current Discharge Medication List    CONTINUE these medications which have CHANGED   Details  isosorbide mononitrate (IMDUR) 60 MG 24 hr tablet Take 0.5 tablets (30 mg total) by mouth daily.    metoprolol succinate (TOPROL-XL) 25 MG 24 hr tablet Take 1 tablet (25 mg total) by mouth daily. Qty: 30  tablet, Refills: 1      CONTINUE these medications which have NOT CHANGED   Details  acetaminophen (TYLENOL) 500 MG tablet Take 1,000 mg by mouth every 6 (six) hours as needed. For pain    albuterol (PROVENTIL HFA;VENTOLIN HFA) 108 (90 BASE) MCG/ACT inhaler Inhale 2 puffs into the lungs every 4 (four) hours as needed for wheezing or shortness of breath.    aspirin EC 81 MG tablet Take 81 mg by mouth daily.      bisacodyl (DULCOLAX) 5 MG EC tablet Take 10 mg by mouth daily as needed. For constipation    Calcium Carbonate-Vitamin D (CALCIUM 600 + D PO) Take 1 tablet by mouth daily.     HYDROcodone-acetaminophen (NORCO/VICODIN) 5-325 MG per tablet Take 1-2 tablets by mouth every 6 (six) hours as needed for pain.    indomethacin (INDOCIN) 50 MG capsule Take 50 mg by mouth 2 (two) times daily as needed (for gout pain).    insulin glargine (LANTUS) 100 UNIT/ML injection Inject 5-10 Units into the skin at bedtime. Patient uses sliding scale.    Multiple Vitamin (MULITIVITAMIN WITH MINERALS) TABS Take 1 tablet by mouth daily.    SENSIPAR 90 MG tablet Take 90 mg by mouth daily. Refills: 4    sevelamer carbonate (RENVELA) 800 MG tablet Take 2,400 mg by mouth 3 (three) times daily with meals.    nitroGLYCERIN (NITROSTAT) 0.4 MG SL tablet Place 0.4 mg under the tongue  every 5 (five) minutes as needed for chest pain.      STOP taking these medications     amLODipine (NORVASC) 10 MG tablet      carvedilol (COREG) 25 MG tablet      gabapentin (NEURONTIN) 100 MG capsule      hydrALAZINE (APRESOLINE) 50 MG tablet      potassium chloride SA (K-DUR,KLOR-CON) 20 MEQ tablet        No Known Allergies Follow-up Information    Follow up with [name], MD In 2 weeks.   Specialty:  Internal Medicine   Contact information:   [place] [id number] [phone]        The results of significant diagnostics from this hospitalization (including imaging, microbiology,  ancillary and laboratory) are listed below for reference.    Significant Diagnostic Studies: Ct Head Wo Contrast  12/04/2014  CLINICAL DATA:  Slurred speech and dizziness for 1 day. EXAM: CT HEAD WITHOUT CONTRAST TECHNIQUE: Contiguous axial images were obtained from the base of the skull through the vertex without intravenous contrast. COMPARISON:  None. FINDINGS: Sinuses/Soft tissues: Right globe resection with presumed prosthesis. Clear paranasal sinuses and mastoid air cells. Intracranial: Moderate low density in the periventricular white matter likely related to small vessel disease. Cerebral and cerebellar atrophy. The cerebellar atrophy is mildly age advanced. No mass lesion, hemorrhage, hydrocephalus, acute infarct, intra-axial, or extra-axial fluid collection. IMPRESSION: 1.  No acute intracranial abnormality. 2. Atrophy and small vessel ischemic change. Electronically Signed   By: Abigail Miyamoto M.D.   On: 12/04/2014 13:59   Mr [name] Head Wo Contrast  12/05/2014  CLINICAL DATA:  Neurological deficits: Uncontrollable twitching RIGHT arm beginning 5 days ago, leg weakness, stuttering speech beginning today. History of end-stage renal disease on dialysis, CHF, hypertension, RIGHT eye blindness, diabetes. EXAM: MRI HEAD WITHOUT CONTRAST MRA HEAD WITHOUT CONTRAST TECHNIQUE: Multiplanar, multiecho pulse sequences of the brain and surrounding structures were obtained without intravenous contrast. Angiographic images of the head were obtained using MRA technique without contrast. Coronal diffusion weighted imaging not able to be obtained. COMPARISON:  CT head December 04, 2014 FINDINGS: MRI HEAD FINDINGS Mildly motion degraded examination. No reduced diffusion to suggest acute ischemia. No susceptibility artifact to suggest hemorrhage. Ventricles and sulci are normal for patient's age. Old small RIGHT cerebellar infarct. Old RIGHT thalamus and bilateral basal ganglia lacunar infarcts. Patchy to confluent  supratentorial and pontine white matter T2 hyperintensities without midline shift, mass effect or mass lesions. No abnormal extra-axial fluid collections. RIGHT ocular globe prosthesis. Status post LEFT ocular lens implant. Mild paranasal sinus mucosal thickening without air-fluid levels. Mastoid air cells are well aerated. Mildly expanded sella move most compatible with partially empty sella. No cerebellar tonsillar ectopia. Heterogeneous bone marrow signal, suggesting renal osteodystrophy in patient with history of end-stage renal disease. Patient is edentulous. MRA HEAD FINDINGS Moderate to severely motion degraded examination results in duplicated appearance of the intracranial vessels. Flow related enhancement within the cervical, petrous, cavernous and supraclinoid internal carotid arteries. LEFT A1 segment is dominant with flow related enhancement within the anterior and middle cerebral arteries. Flow related enhancement within the vertebral arteries, basilar artery and posterior cerebral arteries. Small apparent bilateral posterior communicating arteries present. No acute large vessel occlusion. No high-grade stenosis, limited assessment for moderate low grade stenosis, move limited assessment for aneurysm due to motion. IMPRESSION: MRI HEAD: No acute intracranial process on this mildly motion degraded examination. Chronic changes including old bilateral basal ganglia, RIGHT thalamus lacunar  infarcts and old small RIGHT cerebellar infarct. Moderate to severe chronic small vessel ischemic disease. MRA HEAD: Motion degraded examination without large vessel occlusion or high-grade stenosis. Electronically Signed   By: Elon Alas M.D.   On: 12/05/2014 22:48   Mr Brain Wo Contrast  12/05/2014  CLINICAL DATA:  Neurological deficits: Uncontrollable twitching RIGHT arm beginning 5 days ago, leg weakness, stuttering speech beginning today. History of end-stage renal disease on dialysis, CHF, hypertension,  RIGHT eye blindness, diabetes. EXAM: MRI HEAD WITHOUT CONTRAST MRA HEAD WITHOUT CONTRAST TECHNIQUE: Multiplanar, multiecho pulse sequences of the brain and surrounding structures were obtained without intravenous contrast. Angiographic images of the head were obtained using MRA technique without contrast. Coronal diffusion weighted imaging not able to be obtained. COMPARISON:  CT head December 04, 2014 FINDINGS: MRI HEAD FINDINGS Mildly motion degraded examination. No reduced diffusion to suggest acute ischemia. No susceptibility artifact to suggest hemorrhage. Ventricles and sulci are normal for patient's age. Old small RIGHT cerebellar infarct. Old RIGHT thalamus and bilateral basal ganglia lacunar infarcts. Patchy to confluent supratentorial and pontine white matter T2 hyperintensities without midline shift, mass effect or mass lesions. No abnormal extra-axial fluid collections. RIGHT ocular globe prosthesis. Status post LEFT ocular lens implant. Mild paranasal sinus mucosal thickening without air-fluid levels. Mastoid air cells are well aerated. Mildly expanded sella move most compatible with partially empty sella. No cerebellar tonsillar ectopia. Heterogeneous bone marrow signal, suggesting renal osteodystrophy in patient with history of end-stage renal disease. Patient is edentulous. MRA HEAD FINDINGS Moderate to severely motion degraded examination results in duplicated appearance of the intracranial vessels. Flow related enhancement within the cervical, petrous, cavernous and supraclinoid internal carotid arteries. LEFT A1 segment is dominant with flow related enhancement within the anterior and middle cerebral arteries. Flow related enhancement within the vertebral arteries, basilar artery and posterior cerebral arteries. Small apparent bilateral posterior communicating arteries present. No acute large vessel occlusion. No high-grade stenosis, limited assessment for moderate low grade stenosis, move limited  assessment for aneurysm due to motion. IMPRESSION: MRI HEAD: No acute intracranial process on this mildly motion degraded examination. Chronic changes including old bilateral basal ganglia, RIGHT thalamus lacunar infarcts and old small RIGHT cerebellar infarct. Moderate to severe chronic small vessel ischemic disease. MRA HEAD: Motion degraded examination without large vessel occlusion or high-grade stenosis. Electronically Signed   By: Elon Alas M.D.   On: 12/05/2014 22:48    Microbiology: Recent Results (from the past 240 hour(s))  MRSA PCR Screening     Status: None   Collection Time: 12/05/14  2:57 AM  Result Value Ref Range Status   MRSA by PCR NEGATIVE NEGATIVE Final    Comment:        The GeneXpert MRSA Assay (FDA approved for NASAL specimens only), is one component of a comprehensive MRSA colonization surveillance program. It is not intended to diagnose MRSA infection nor to guide or monitor treatment for MRSA infections.      Labs: Basic Metabolic Panel:  Recent Labs Lab 12/04/14 1222 12/04/14 1258 12/04/14 2045 12/04/14 2153 12/05/14 0544 12/07/14 0930  NA 138 137 138  --  137 136  K 6.6* 6.2* 7.2* 3.8 4.5 5.2*  CL 100* 102 100*  --  97* 98*  CO2 26  --  23  --  30 26  GLUCOSE 131* 125* 72  --  135* 87  BUN 59* 57* 64*  --  27* 52*  CREATININE 10.56* 9.90* 10.89*  --  6.56* 9.08*  CALCIUM  10.8*  --  10.1  --  9.1 8.5*  PHOS  --   --  4.7*  --   --  5.5*   Liver Function Tests:  Recent Labs Lab 12/04/14 1222 12/04/14 2045 12/05/14 0544 12/07/14 0930  AST 23  --  28  --   ALT 15  --  12*  --   ALKPHOS 41  --  37*  --   BILITOT 0.6  --  0.1*  --   PROT 7.5  --  6.1*  --   ALBUMIN 3.5 3.3* 2.9* 2.9*   CBC:  Recent Labs Lab 12/04/14 1222 12/04/14 1258 12/04/14 2153 12/07/14 0708  WBC 7.7  --  8.6 6.2  NEUTROABS 4.2  --   --   --   HGB 11.2* 13.3 11.0* 10.3*  HCT 36.1 39.0 34.6* 32.9*  MCV 96.3  --  94.5 94.8  PLT 179  --  178 151    CBG:  Recent Labs Lab 12/06/14 0800 12/06/14 1135 12/06/14 1647 12/06/14 2100 12/07/14 1101  GLUCAP 95 127* 153* 185* 101*    Signed:  Barton Dubois  [facility] 12/07/2014, 3:18 PM

## 2014-12-07 NOTE — Care Management Note (Signed)
Case Management Note  Patient Details  Name: [name] MRN: [id number] Date of Birth: 10-15-1945  Subjective/Objective:     CM following for progression and d/c planning.               Action/Plan: 12/07/2014 Met with pt who has [place] for 2 hr per day, 7 days per week, through her Medicaid. This Pt has had [place] services with [facility] in the past and wishes to continue these services for [facility], [facility] and [place]. [place] notified of [place] orders and plan to d/c to home today.   Expected Discharge Date:   12/07/2014               Expected Discharge Plan:  [place]  In-House Referral:  NA  Discharge planning Services  CM Consult  Post Acute Care Choice:  NA Choice offered to:  Patient  DME Arranged:  N/A DME Agency:   NA  HH Arranged:  RN, PT, OT [place] Agency:  [place]  Status of Service:  Completed, signed off  Medicare Important Message Given:    Date Medicare IM Given:    Medicare IM give by:    Date Additional Medicare IM Given:    Additional Medicare Important Message give by:     If discussed at [place] of Stay Meetings, dates discussed:    Additional Comments:  Adron Bene, RN 12/07/2014, 12:05 PM

## 2014-12-07 NOTE — Evaluation (Signed)
Occupational Therapy Evaluation Patient Details Name: [name] MRN: [id number] DOB: 10-11-1945 Today's Date: 12/07/2014    History of Present Illness [name] is a 68 y.o. female, with coronary artery disease, CHF, diabetes with neuropathy, in stage renal disease (dialyzes TTS) who presents to the emergency department today with neurological deficits. According to her family the patient developed uncontrollable twitching in her right arm on Saturday. On Sunday she developed weakness in her legs and her daughter noticed she was leaning to one side. [facility] [employer] and [facility] speech. She is complaining of unbalanced gait, and significant dyspnea on exertion. Awaiting stroke work up.   Clinical Impression   Pt currently supervision level overall for simulated selfcare tasks and functional transfers.  Slightly impulsive with mobility with therapist recommending her to slow down a little bit and to use her cane or RW to help reduce risk of falls.  Also recommended waiting and performing showering when OT is present or [facility] aide can provide supervision.  Will defer OT treatment to [facility] services as pt is discharging later today.     Follow Up Recommendations  Home health OT;Supervision - Intermittent    Equipment Recommendations  None recommended by OT       Precautions / Restrictions Precautions Precautions: Fall Restrictions Weight Bearing Restrictions: No      Mobility Bed Mobility                  Transfers Overall transfer level: Needs assistance   Transfers: Sit to/from Stand Sit to Stand: Min guard         General transfer comment: Pt did not need physical assist for sit to stand or balance but did need supervision for safety secondary to abnormal gait pattern with increased right turnk flexion in stance phase.     Balance Overall balance assessment: Needs assistance   Sitting balance-Leahy Scale: Good        Standing balance-Leahy Scale: Fair                              ADL Overall ADL's : Needs assistance/impaired Eating/Feeding: Independent;Sitting   Grooming: Wash/dry hands;Wash/dry face;Standing;Supervision/safety   Upper Body Bathing: Set up;Sitting   Lower Body Bathing: Supervison/ safety;Sit to/from stand   Upper Body Dressing : Supervision/safety;Sitting   Lower Body Dressing: Supervision/safety;Sit to/from stand   Toilet Transfer: Supervision/safety;Regular Toilet;Grab bars   Toileting- Water quality scientist and Hygiene: Supervision/safety;Sit to/from stand       Functional mobility during ADLs: Min guard General ADL Comments: Pt impulsive, moves very quickly.  When therapist asked her if she usually walked this fast, she reported that she was usually faster.  Feel she is a fall risk and needs supervision initially for safety.  Will recommend HHOT eval as well.  Pt reports having a shower seat and 3:1.  She used her cane and her walker, depending on how she felt about her balance.  Recommended having supervision before attempting showering.       Vision Vision Assessment?: No apparent visual deficits   Perception Perception Perception Tested?: No   Praxis Praxis Praxis tested?: Within functional limits    Pertinent Vitals/Pain Pain Assessment: No/denies pain     Hand Dominance Right   Extremity/Trunk Assessment Upper Extremity Assessment Upper Extremity Assessment: Generalized weakness;RUE deficits/detail RUE Deficits / Details: strength 3+/5 in shoulder flexion and elbow extension, 4/5 with elbow flexion and grip  strength RUE Sensation: decreased light touch (pt reports tingling in the hands) RUE Coordination: decreased fine motor       Cervical / Trunk Assessment Cervical / Trunk Assessment: Normal   Communication Communication Communication: Expressive difficulties   Cognition Arousal/Alertness: Awake/alert Behavior During Therapy: WFL  for tasks assessed/performed Overall Cognitive Status: Within Functional Limits for tasks assessed                                Home Living Family/patient expects to be discharged to:: Private residence Living Arrangements: Alone Available Help at Discharge: Family;Available PRN/intermittently Type of Home: House Home Access: Stairs to enter [employer] of Steps: 3 Entrance Stairs-Rails: Can reach both Home Layout: One level     Bathroom Shower/Tub: Tub/shower unit Shower/tub characteristics: Curtain Biochemist, clinical: Standard     Home Equipment: Environmental consultant - 2 wheels;Walker - 4 wheels;Shower seat;Bedside commode;Cane - single point          Prior Functioning/Environment Level of Independence: Independent        Comments: pt was attending silver sneakers and was able to drive and grocery shop    OT Diagnosis: Generalized weakness   OT Problem List: Decreased strength;Decreased activity tolerance;Impaired balance (sitting and/or standing);Decreased knowledge of use of DME or AE                       End of Session Nurse Communication: Mobility status  Activity Tolerance: Patient tolerated treatment well Patient left: in chair;with call bell/phone within reach;with family/visitor present   Time: 1544-1600 OT Time Calculation (min): 16 min Charges:  OT General Charges $OT Visit: 1 Procedure OT Evaluation $Initial OT Evaluation Tier I: 1 Procedure  Nasra Counce OTR/L 12/07/2014, 4:52 PM

## 2014-12-08 DIAGNOSIS — J40 Bronchitis, not specified as acute or chronic: Secondary | ICD-10-CM | POA: Diagnosis not present

## 2014-12-08 DIAGNOSIS — D539 Nutritional anemia, unspecified: Secondary | ICD-10-CM | POA: Diagnosis not present

## 2014-12-08 DIAGNOSIS — M109 Gout, unspecified: Secondary | ICD-10-CM | POA: Diagnosis not present

## 2014-12-08 DIAGNOSIS — J151 Pneumonia due to Pseudomonas: Secondary | ICD-10-CM | POA: Diagnosis not present

## 2014-12-09 DIAGNOSIS — D539 Nutritional anemia, unspecified: Secondary | ICD-10-CM | POA: Diagnosis not present

## 2014-12-09 DIAGNOSIS — J151 Pneumonia due to Pseudomonas: Secondary | ICD-10-CM | POA: Diagnosis not present

## 2014-12-09 DIAGNOSIS — N186 End stage renal disease: Secondary | ICD-10-CM | POA: Diagnosis not present

## 2014-12-09 DIAGNOSIS — M109 Gout, unspecified: Secondary | ICD-10-CM | POA: Diagnosis not present

## 2014-12-09 DIAGNOSIS — J40 Bronchitis, not specified as acute or chronic: Secondary | ICD-10-CM | POA: Diagnosis not present

## 2014-12-10 DIAGNOSIS — J151 Pneumonia due to Pseudomonas: Secondary | ICD-10-CM | POA: Diagnosis not present

## 2014-12-10 DIAGNOSIS — M109 Gout, unspecified: Secondary | ICD-10-CM | POA: Diagnosis not present

## 2014-12-10 DIAGNOSIS — J40 Bronchitis, not specified as acute or chronic: Secondary | ICD-10-CM | POA: Diagnosis not present

## 2014-12-10 DIAGNOSIS — D539 Nutritional anemia, unspecified: Secondary | ICD-10-CM | POA: Diagnosis not present

## 2014-12-11 DIAGNOSIS — J151 Pneumonia due to Pseudomonas: Secondary | ICD-10-CM | POA: Diagnosis not present

## 2014-12-11 DIAGNOSIS — I2510 Atherosclerotic heart disease of native coronary artery without angina pectoris: Secondary | ICD-10-CM | POA: Diagnosis not present

## 2014-12-11 DIAGNOSIS — M109 Gout, unspecified: Secondary | ICD-10-CM | POA: Diagnosis not present

## 2014-12-11 DIAGNOSIS — J40 Bronchitis, not specified as acute or chronic: Secondary | ICD-10-CM | POA: Diagnosis not present

## 2014-12-11 DIAGNOSIS — E875 Hyperkalemia: Secondary | ICD-10-CM | POA: Diagnosis not present

## 2014-12-11 DIAGNOSIS — D539 Nutritional anemia, unspecified: Secondary | ICD-10-CM | POA: Diagnosis not present

## 2014-12-11 DIAGNOSIS — D649 Anemia, unspecified: Secondary | ICD-10-CM | POA: Diagnosis not present

## 2014-12-11 DIAGNOSIS — I509 Heart failure, unspecified: Secondary | ICD-10-CM | POA: Diagnosis not present

## 2014-12-11 DIAGNOSIS — E1122 Type 2 diabetes mellitus with diabetic chronic kidney disease: Secondary | ICD-10-CM | POA: Diagnosis not present

## 2014-12-11 DIAGNOSIS — N186 End stage renal disease: Secondary | ICD-10-CM | POA: Diagnosis not present

## 2014-12-11 DIAGNOSIS — I120 Hypertensive chronic kidney disease with stage 5 chronic kidney disease or end stage renal disease: Secondary | ICD-10-CM | POA: Diagnosis not present

## 2014-12-11 DIAGNOSIS — E1140 Type 2 diabetes mellitus with diabetic neuropathy, unspecified: Secondary | ICD-10-CM | POA: Diagnosis not present

## 2014-12-12 DIAGNOSIS — J151 Pneumonia due to Pseudomonas: Secondary | ICD-10-CM | POA: Diagnosis not present

## 2014-12-12 DIAGNOSIS — J40 Bronchitis, not specified as acute or chronic: Secondary | ICD-10-CM | POA: Diagnosis not present

## 2014-12-12 DIAGNOSIS — D539 Nutritional anemia, unspecified: Secondary | ICD-10-CM | POA: Diagnosis not present

## 2014-12-12 DIAGNOSIS — M109 Gout, unspecified: Secondary | ICD-10-CM | POA: Diagnosis not present

## 2014-12-12 DIAGNOSIS — N186 End stage renal disease: Secondary | ICD-10-CM | POA: Diagnosis not present

## 2014-12-13 DIAGNOSIS — J40 Bronchitis, not specified as acute or chronic: Secondary | ICD-10-CM | POA: Diagnosis not present

## 2014-12-13 DIAGNOSIS — I509 Heart failure, unspecified: Secondary | ICD-10-CM | POA: Diagnosis not present

## 2014-12-13 DIAGNOSIS — N186 End stage renal disease: Secondary | ICD-10-CM | POA: Diagnosis not present

## 2014-12-13 DIAGNOSIS — J151 Pneumonia due to Pseudomonas: Secondary | ICD-10-CM | POA: Diagnosis not present

## 2014-12-13 DIAGNOSIS — D649 Anemia, unspecified: Secondary | ICD-10-CM | POA: Diagnosis not present

## 2014-12-13 DIAGNOSIS — E1140 Type 2 diabetes mellitus with diabetic neuropathy, unspecified: Secondary | ICD-10-CM | POA: Diagnosis not present

## 2014-12-13 DIAGNOSIS — I120 Hypertensive chronic kidney disease with stage 5 chronic kidney disease or end stage renal disease: Secondary | ICD-10-CM | POA: Diagnosis not present

## 2014-12-13 DIAGNOSIS — I2510 Atherosclerotic heart disease of native coronary artery without angina pectoris: Secondary | ICD-10-CM | POA: Diagnosis not present

## 2014-12-13 DIAGNOSIS — D539 Nutritional anemia, unspecified: Secondary | ICD-10-CM | POA: Diagnosis not present

## 2014-12-13 DIAGNOSIS — E1122 Type 2 diabetes mellitus with diabetic chronic kidney disease: Secondary | ICD-10-CM | POA: Diagnosis not present

## 2014-12-13 DIAGNOSIS — E875 Hyperkalemia: Secondary | ICD-10-CM | POA: Diagnosis not present

## 2014-12-13 DIAGNOSIS — M109 Gout, unspecified: Secondary | ICD-10-CM | POA: Diagnosis not present

## 2014-12-14 DIAGNOSIS — M109 Gout, unspecified: Secondary | ICD-10-CM | POA: Diagnosis not present

## 2014-12-14 DIAGNOSIS — J40 Bronchitis, not specified as acute or chronic: Secondary | ICD-10-CM | POA: Diagnosis not present

## 2014-12-14 DIAGNOSIS — J151 Pneumonia due to Pseudomonas: Secondary | ICD-10-CM | POA: Diagnosis not present

## 2014-12-14 DIAGNOSIS — D539 Nutritional anemia, unspecified: Secondary | ICD-10-CM | POA: Diagnosis not present

## 2014-12-15 DIAGNOSIS — N186 End stage renal disease: Secondary | ICD-10-CM | POA: Diagnosis not present

## 2014-12-15 DIAGNOSIS — D539 Nutritional anemia, unspecified: Secondary | ICD-10-CM | POA: Diagnosis not present

## 2014-12-15 DIAGNOSIS — J40 Bronchitis, not specified as acute or chronic: Secondary | ICD-10-CM | POA: Diagnosis not present

## 2014-12-15 DIAGNOSIS — J151 Pneumonia due to Pseudomonas: Secondary | ICD-10-CM | POA: Diagnosis not present

## 2014-12-15 DIAGNOSIS — M109 Gout, unspecified: Secondary | ICD-10-CM | POA: Diagnosis not present

## 2014-12-16 DIAGNOSIS — M109 Gout, unspecified: Secondary | ICD-10-CM | POA: Diagnosis not present

## 2014-12-16 DIAGNOSIS — D539 Nutritional anemia, unspecified: Secondary | ICD-10-CM | POA: Diagnosis not present

## 2014-12-16 DIAGNOSIS — J40 Bronchitis, not specified as acute or chronic: Secondary | ICD-10-CM | POA: Diagnosis not present

## 2014-12-16 DIAGNOSIS — J151 Pneumonia due to Pseudomonas: Secondary | ICD-10-CM | POA: Diagnosis not present

## 2014-12-17 DIAGNOSIS — N186 End stage renal disease: Secondary | ICD-10-CM | POA: Diagnosis not present

## 2014-12-17 DIAGNOSIS — J151 Pneumonia due to Pseudomonas: Secondary | ICD-10-CM | POA: Diagnosis not present

## 2014-12-17 DIAGNOSIS — D539 Nutritional anemia, unspecified: Secondary | ICD-10-CM | POA: Diagnosis not present

## 2014-12-17 DIAGNOSIS — J40 Bronchitis, not specified as acute or chronic: Secondary | ICD-10-CM | POA: Diagnosis not present

## 2014-12-17 DIAGNOSIS — M109 Gout, unspecified: Secondary | ICD-10-CM | POA: Diagnosis not present

## 2014-12-18 DIAGNOSIS — G9341 Metabolic encephalopathy: Secondary | ICD-10-CM | POA: Diagnosis not present

## 2014-12-18 DIAGNOSIS — I10 Essential (primary) hypertension: Secondary | ICD-10-CM | POA: Diagnosis not present

## 2014-12-18 DIAGNOSIS — M109 Gout, unspecified: Secondary | ICD-10-CM | POA: Diagnosis not present

## 2014-12-18 DIAGNOSIS — D539 Nutritional anemia, unspecified: Secondary | ICD-10-CM | POA: Diagnosis not present

## 2014-12-18 DIAGNOSIS — J151 Pneumonia due to Pseudomonas: Secondary | ICD-10-CM | POA: Diagnosis not present

## 2014-12-18 DIAGNOSIS — J40 Bronchitis, not specified as acute or chronic: Secondary | ICD-10-CM | POA: Diagnosis not present

## 2014-12-18 DIAGNOSIS — E1122 Type 2 diabetes mellitus with diabetic chronic kidney disease: Secondary | ICD-10-CM | POA: Diagnosis not present

## 2014-12-18 DIAGNOSIS — I509 Heart failure, unspecified: Secondary | ICD-10-CM | POA: Diagnosis not present

## 2014-12-18 DIAGNOSIS — E1140 Type 2 diabetes mellitus with diabetic neuropathy, unspecified: Secondary | ICD-10-CM | POA: Diagnosis not present

## 2014-12-18 DIAGNOSIS — N186 End stage renal disease: Secondary | ICD-10-CM | POA: Diagnosis not present

## 2014-12-18 DIAGNOSIS — Z6841 Body Mass Index (BMI) 40.0 and over, adult: Secondary | ICD-10-CM | POA: Diagnosis not present

## 2014-12-18 DIAGNOSIS — D649 Anemia, unspecified: Secondary | ICD-10-CM | POA: Diagnosis not present

## 2014-12-18 DIAGNOSIS — I2510 Atherosclerotic heart disease of native coronary artery without angina pectoris: Secondary | ICD-10-CM | POA: Diagnosis not present

## 2014-12-18 DIAGNOSIS — E875 Hyperkalemia: Secondary | ICD-10-CM | POA: Diagnosis not present

## 2014-12-18 DIAGNOSIS — R05 Cough: Secondary | ICD-10-CM | POA: Diagnosis not present

## 2014-12-18 DIAGNOSIS — I120 Hypertensive chronic kidney disease with stage 5 chronic kidney disease or end stage renal disease: Secondary | ICD-10-CM | POA: Diagnosis not present

## 2014-12-19 DIAGNOSIS — J40 Bronchitis, not specified as acute or chronic: Secondary | ICD-10-CM | POA: Diagnosis not present

## 2014-12-19 DIAGNOSIS — J151 Pneumonia due to Pseudomonas: Secondary | ICD-10-CM | POA: Diagnosis not present

## 2014-12-19 DIAGNOSIS — D539 Nutritional anemia, unspecified: Secondary | ICD-10-CM | POA: Diagnosis not present

## 2014-12-19 DIAGNOSIS — N186 End stage renal disease: Secondary | ICD-10-CM | POA: Diagnosis not present

## 2014-12-19 DIAGNOSIS — M109 Gout, unspecified: Secondary | ICD-10-CM | POA: Diagnosis not present

## 2014-12-20 DIAGNOSIS — J151 Pneumonia due to Pseudomonas: Secondary | ICD-10-CM | POA: Diagnosis not present

## 2014-12-20 DIAGNOSIS — I509 Heart failure, unspecified: Secondary | ICD-10-CM | POA: Diagnosis not present

## 2014-12-20 DIAGNOSIS — E1122 Type 2 diabetes mellitus with diabetic chronic kidney disease: Secondary | ICD-10-CM | POA: Diagnosis not present

## 2014-12-20 DIAGNOSIS — Z992 Dependence on renal dialysis: Secondary | ICD-10-CM | POA: Diagnosis not present

## 2014-12-20 DIAGNOSIS — D649 Anemia, unspecified: Secondary | ICD-10-CM | POA: Diagnosis not present

## 2014-12-20 DIAGNOSIS — J40 Bronchitis, not specified as acute or chronic: Secondary | ICD-10-CM | POA: Diagnosis not present

## 2014-12-20 DIAGNOSIS — E1140 Type 2 diabetes mellitus with diabetic neuropathy, unspecified: Secondary | ICD-10-CM | POA: Diagnosis not present

## 2014-12-20 DIAGNOSIS — D539 Nutritional anemia, unspecified: Secondary | ICD-10-CM | POA: Diagnosis not present

## 2014-12-20 DIAGNOSIS — E875 Hyperkalemia: Secondary | ICD-10-CM | POA: Diagnosis not present

## 2014-12-20 DIAGNOSIS — N186 End stage renal disease: Secondary | ICD-10-CM | POA: Diagnosis not present

## 2014-12-20 DIAGNOSIS — E1129 Type 2 diabetes mellitus with other diabetic kidney complication: Secondary | ICD-10-CM | POA: Diagnosis not present

## 2014-12-20 DIAGNOSIS — I120 Hypertensive chronic kidney disease with stage 5 chronic kidney disease or end stage renal disease: Secondary | ICD-10-CM | POA: Diagnosis not present

## 2014-12-20 DIAGNOSIS — M109 Gout, unspecified: Secondary | ICD-10-CM | POA: Diagnosis not present

## 2014-12-20 DIAGNOSIS — I2510 Atherosclerotic heart disease of native coronary artery without angina pectoris: Secondary | ICD-10-CM | POA: Diagnosis not present

## 2014-12-21 DIAGNOSIS — N186 End stage renal disease: Secondary | ICD-10-CM | POA: Diagnosis not present

## 2014-12-21 DIAGNOSIS — J151 Pneumonia due to Pseudomonas: Secondary | ICD-10-CM | POA: Diagnosis not present

## 2014-12-21 DIAGNOSIS — D539 Nutritional anemia, unspecified: Secondary | ICD-10-CM | POA: Diagnosis not present

## 2014-12-21 DIAGNOSIS — J40 Bronchitis, not specified as acute or chronic: Secondary | ICD-10-CM | POA: Diagnosis not present

## 2014-12-21 DIAGNOSIS — M109 Gout, unspecified: Secondary | ICD-10-CM | POA: Diagnosis not present

## 2014-12-22 DIAGNOSIS — I2510 Atherosclerotic heart disease of native coronary artery without angina pectoris: Secondary | ICD-10-CM | POA: Diagnosis not present

## 2014-12-22 DIAGNOSIS — D539 Nutritional anemia, unspecified: Secondary | ICD-10-CM | POA: Diagnosis not present

## 2014-12-22 DIAGNOSIS — E875 Hyperkalemia: Secondary | ICD-10-CM | POA: Diagnosis not present

## 2014-12-22 DIAGNOSIS — E1122 Type 2 diabetes mellitus with diabetic chronic kidney disease: Secondary | ICD-10-CM | POA: Diagnosis not present

## 2014-12-22 DIAGNOSIS — I509 Heart failure, unspecified: Secondary | ICD-10-CM | POA: Diagnosis not present

## 2014-12-22 DIAGNOSIS — N186 End stage renal disease: Secondary | ICD-10-CM | POA: Diagnosis not present

## 2014-12-22 DIAGNOSIS — J40 Bronchitis, not specified as acute or chronic: Secondary | ICD-10-CM | POA: Diagnosis not present

## 2014-12-22 DIAGNOSIS — I120 Hypertensive chronic kidney disease with stage 5 chronic kidney disease or end stage renal disease: Secondary | ICD-10-CM | POA: Diagnosis not present

## 2014-12-22 DIAGNOSIS — D649 Anemia, unspecified: Secondary | ICD-10-CM | POA: Diagnosis not present

## 2014-12-22 DIAGNOSIS — M109 Gout, unspecified: Secondary | ICD-10-CM | POA: Diagnosis not present

## 2014-12-22 DIAGNOSIS — E1140 Type 2 diabetes mellitus with diabetic neuropathy, unspecified: Secondary | ICD-10-CM | POA: Diagnosis not present

## 2014-12-22 DIAGNOSIS — J151 Pneumonia due to Pseudomonas: Secondary | ICD-10-CM | POA: Diagnosis not present

## 2014-12-23 DIAGNOSIS — D539 Nutritional anemia, unspecified: Secondary | ICD-10-CM | POA: Diagnosis not present

## 2014-12-23 DIAGNOSIS — J40 Bronchitis, not specified as acute or chronic: Secondary | ICD-10-CM | POA: Diagnosis not present

## 2014-12-23 DIAGNOSIS — J151 Pneumonia due to Pseudomonas: Secondary | ICD-10-CM | POA: Diagnosis not present

## 2014-12-23 DIAGNOSIS — N186 End stage renal disease: Secondary | ICD-10-CM | POA: Diagnosis not present

## 2014-12-23 DIAGNOSIS — M109 Gout, unspecified: Secondary | ICD-10-CM | POA: Diagnosis not present

## 2014-12-24 DIAGNOSIS — J151 Pneumonia due to Pseudomonas: Secondary | ICD-10-CM | POA: Diagnosis not present

## 2014-12-24 DIAGNOSIS — M109 Gout, unspecified: Secondary | ICD-10-CM | POA: Diagnosis not present

## 2014-12-24 DIAGNOSIS — J40 Bronchitis, not specified as acute or chronic: Secondary | ICD-10-CM | POA: Diagnosis not present

## 2014-12-24 DIAGNOSIS — D539 Nutritional anemia, unspecified: Secondary | ICD-10-CM | POA: Diagnosis not present

## 2014-12-25 DIAGNOSIS — E1122 Type 2 diabetes mellitus with diabetic chronic kidney disease: Secondary | ICD-10-CM | POA: Diagnosis not present

## 2014-12-25 DIAGNOSIS — N186 End stage renal disease: Secondary | ICD-10-CM | POA: Diagnosis not present

## 2014-12-25 DIAGNOSIS — I5022 Chronic systolic (congestive) heart failure: Secondary | ICD-10-CM | POA: Diagnosis not present

## 2014-12-25 DIAGNOSIS — J151 Pneumonia due to Pseudomonas: Secondary | ICD-10-CM | POA: Diagnosis not present

## 2014-12-25 DIAGNOSIS — I509 Heart failure, unspecified: Secondary | ICD-10-CM | POA: Diagnosis not present

## 2014-12-25 DIAGNOSIS — D539 Nutritional anemia, unspecified: Secondary | ICD-10-CM | POA: Diagnosis not present

## 2014-12-25 DIAGNOSIS — M109 Gout, unspecified: Secondary | ICD-10-CM | POA: Diagnosis not present

## 2014-12-25 DIAGNOSIS — D649 Anemia, unspecified: Secondary | ICD-10-CM | POA: Diagnosis not present

## 2014-12-25 DIAGNOSIS — I2510 Atherosclerotic heart disease of native coronary artery without angina pectoris: Secondary | ICD-10-CM | POA: Diagnosis not present

## 2014-12-25 DIAGNOSIS — J40 Bronchitis, not specified as acute or chronic: Secondary | ICD-10-CM | POA: Diagnosis not present

## 2014-12-25 DIAGNOSIS — E1140 Type 2 diabetes mellitus with diabetic neuropathy, unspecified: Secondary | ICD-10-CM | POA: Diagnosis not present

## 2014-12-25 DIAGNOSIS — I120 Hypertensive chronic kidney disease with stage 5 chronic kidney disease or end stage renal disease: Secondary | ICD-10-CM | POA: Diagnosis not present

## 2014-12-25 DIAGNOSIS — E875 Hyperkalemia: Secondary | ICD-10-CM | POA: Diagnosis not present

## 2014-12-25 DIAGNOSIS — I10 Essential (primary) hypertension: Secondary | ICD-10-CM | POA: Diagnosis not present

## 2014-12-26 DIAGNOSIS — J40 Bronchitis, not specified as acute or chronic: Secondary | ICD-10-CM | POA: Diagnosis not present

## 2014-12-26 DIAGNOSIS — M109 Gout, unspecified: Secondary | ICD-10-CM | POA: Diagnosis not present

## 2014-12-26 DIAGNOSIS — J151 Pneumonia due to Pseudomonas: Secondary | ICD-10-CM | POA: Diagnosis not present

## 2014-12-26 DIAGNOSIS — N186 End stage renal disease: Secondary | ICD-10-CM | POA: Diagnosis not present

## 2014-12-26 DIAGNOSIS — D539 Nutritional anemia, unspecified: Secondary | ICD-10-CM | POA: Diagnosis not present

## 2014-12-27 DIAGNOSIS — D539 Nutritional anemia, unspecified: Secondary | ICD-10-CM | POA: Diagnosis not present

## 2014-12-27 DIAGNOSIS — M109 Gout, unspecified: Secondary | ICD-10-CM | POA: Diagnosis not present

## 2014-12-27 DIAGNOSIS — J151 Pneumonia due to Pseudomonas: Secondary | ICD-10-CM | POA: Diagnosis not present

## 2014-12-27 DIAGNOSIS — J40 Bronchitis, not specified as acute or chronic: Secondary | ICD-10-CM | POA: Diagnosis not present

## 2014-12-28 DIAGNOSIS — M109 Gout, unspecified: Secondary | ICD-10-CM | POA: Diagnosis not present

## 2014-12-28 DIAGNOSIS — J151 Pneumonia due to Pseudomonas: Secondary | ICD-10-CM | POA: Diagnosis not present

## 2014-12-28 DIAGNOSIS — D539 Nutritional anemia, unspecified: Secondary | ICD-10-CM | POA: Diagnosis not present

## 2014-12-28 DIAGNOSIS — N186 End stage renal disease: Secondary | ICD-10-CM | POA: Diagnosis not present

## 2014-12-28 DIAGNOSIS — J40 Bronchitis, not specified as acute or chronic: Secondary | ICD-10-CM | POA: Diagnosis not present

## 2014-12-29 ENCOUNTER — Ambulatory Visit
Admission: RE | Admit: 2014-12-29 | Discharge: 2014-12-29 | Disposition: A | Discharge status: Home / Self Care | Payer: Commercial Managed Care - HMO | Source: Ambulatory Visit

## 2014-12-29 DIAGNOSIS — M109 Gout, unspecified: Secondary | ICD-10-CM | POA: Diagnosis not present

## 2014-12-29 DIAGNOSIS — D539 Nutritional anemia, unspecified: Secondary | ICD-10-CM | POA: Diagnosis not present

## 2014-12-29 DIAGNOSIS — Z1231 Encounter for screening mammogram for malignant neoplasm of breast: Secondary | ICD-10-CM

## 2014-12-29 DIAGNOSIS — J40 Bronchitis, not specified as acute or chronic: Secondary | ICD-10-CM | POA: Diagnosis not present

## 2014-12-29 DIAGNOSIS — J151 Pneumonia due to Pseudomonas: Secondary | ICD-10-CM | POA: Diagnosis not present

## 2014-12-30 DIAGNOSIS — N186 End stage renal disease: Secondary | ICD-10-CM | POA: Diagnosis not present

## 2014-12-30 DIAGNOSIS — M109 Gout, unspecified: Secondary | ICD-10-CM | POA: Diagnosis not present

## 2014-12-30 DIAGNOSIS — J151 Pneumonia due to Pseudomonas: Secondary | ICD-10-CM | POA: Diagnosis not present

## 2014-12-30 DIAGNOSIS — J40 Bronchitis, not specified as acute or chronic: Secondary | ICD-10-CM | POA: Diagnosis not present

## 2014-12-30 DIAGNOSIS — D539 Nutritional anemia, unspecified: Secondary | ICD-10-CM | POA: Diagnosis not present

## 2014-12-31 DIAGNOSIS — J151 Pneumonia due to Pseudomonas: Secondary | ICD-10-CM | POA: Diagnosis not present

## 2014-12-31 DIAGNOSIS — D539 Nutritional anemia, unspecified: Secondary | ICD-10-CM | POA: Diagnosis not present

## 2014-12-31 DIAGNOSIS — J40 Bronchitis, not specified as acute or chronic: Secondary | ICD-10-CM | POA: Diagnosis not present

## 2014-12-31 DIAGNOSIS — M109 Gout, unspecified: Secondary | ICD-10-CM | POA: Diagnosis not present

## 2015-01-01 DIAGNOSIS — D539 Nutritional anemia, unspecified: Secondary | ICD-10-CM | POA: Diagnosis not present

## 2015-01-01 DIAGNOSIS — J151 Pneumonia due to Pseudomonas: Secondary | ICD-10-CM | POA: Diagnosis not present

## 2015-01-01 DIAGNOSIS — J40 Bronchitis, not specified as acute or chronic: Secondary | ICD-10-CM | POA: Diagnosis not present

## 2015-01-01 DIAGNOSIS — M109 Gout, unspecified: Secondary | ICD-10-CM | POA: Diagnosis not present

## 2015-01-02 DIAGNOSIS — N186 End stage renal disease: Secondary | ICD-10-CM | POA: Diagnosis not present

## 2015-01-02 DIAGNOSIS — J40 Bronchitis, not specified as acute or chronic: Secondary | ICD-10-CM | POA: Diagnosis not present

## 2015-01-02 DIAGNOSIS — J151 Pneumonia due to Pseudomonas: Secondary | ICD-10-CM | POA: Diagnosis not present

## 2015-01-02 DIAGNOSIS — M109 Gout, unspecified: Secondary | ICD-10-CM | POA: Diagnosis not present

## 2015-01-02 DIAGNOSIS — D539 Nutritional anemia, unspecified: Secondary | ICD-10-CM | POA: Diagnosis not present

## 2015-01-03 DIAGNOSIS — J151 Pneumonia due to Pseudomonas: Secondary | ICD-10-CM | POA: Diagnosis not present

## 2015-01-03 DIAGNOSIS — D539 Nutritional anemia, unspecified: Secondary | ICD-10-CM | POA: Diagnosis not present

## 2015-01-03 DIAGNOSIS — M109 Gout, unspecified: Secondary | ICD-10-CM | POA: Diagnosis not present

## 2015-01-03 DIAGNOSIS — J40 Bronchitis, not specified as acute or chronic: Secondary | ICD-10-CM | POA: Diagnosis not present

## 2015-01-04 DIAGNOSIS — M109 Gout, unspecified: Secondary | ICD-10-CM | POA: Diagnosis not present

## 2015-01-04 DIAGNOSIS — J40 Bronchitis, not specified as acute or chronic: Secondary | ICD-10-CM | POA: Diagnosis not present

## 2015-01-04 DIAGNOSIS — D539 Nutritional anemia, unspecified: Secondary | ICD-10-CM | POA: Diagnosis not present

## 2015-01-04 DIAGNOSIS — N186 End stage renal disease: Secondary | ICD-10-CM | POA: Diagnosis not present

## 2015-01-04 DIAGNOSIS — J151 Pneumonia due to Pseudomonas: Secondary | ICD-10-CM | POA: Diagnosis not present

## 2015-01-05 DIAGNOSIS — J151 Pneumonia due to Pseudomonas: Secondary | ICD-10-CM | POA: Diagnosis not present

## 2015-01-05 DIAGNOSIS — M109 Gout, unspecified: Secondary | ICD-10-CM | POA: Diagnosis not present

## 2015-01-05 DIAGNOSIS — J40 Bronchitis, not specified as acute or chronic: Secondary | ICD-10-CM | POA: Diagnosis not present

## 2015-01-05 DIAGNOSIS — D539 Nutritional anemia, unspecified: Secondary | ICD-10-CM | POA: Diagnosis not present

## 2015-01-06 DIAGNOSIS — N186 End stage renal disease: Secondary | ICD-10-CM | POA: Diagnosis not present

## 2015-01-06 DIAGNOSIS — J151 Pneumonia due to Pseudomonas: Secondary | ICD-10-CM | POA: Diagnosis not present

## 2015-01-06 DIAGNOSIS — D539 Nutritional anemia, unspecified: Secondary | ICD-10-CM | POA: Diagnosis not present

## 2015-01-06 DIAGNOSIS — M109 Gout, unspecified: Secondary | ICD-10-CM | POA: Diagnosis not present

## 2015-01-06 DIAGNOSIS — J40 Bronchitis, not specified as acute or chronic: Secondary | ICD-10-CM | POA: Diagnosis not present

## 2015-01-07 DIAGNOSIS — D539 Nutritional anemia, unspecified: Secondary | ICD-10-CM | POA: Diagnosis not present

## 2015-01-07 DIAGNOSIS — J151 Pneumonia due to Pseudomonas: Secondary | ICD-10-CM | POA: Diagnosis not present

## 2015-01-07 DIAGNOSIS — M109 Gout, unspecified: Secondary | ICD-10-CM | POA: Diagnosis not present

## 2015-01-07 DIAGNOSIS — J40 Bronchitis, not specified as acute or chronic: Secondary | ICD-10-CM | POA: Diagnosis not present

## 2015-01-08 DIAGNOSIS — D539 Nutritional anemia, unspecified: Secondary | ICD-10-CM | POA: Diagnosis not present

## 2015-01-08 DIAGNOSIS — J40 Bronchitis, not specified as acute or chronic: Secondary | ICD-10-CM | POA: Diagnosis not present

## 2015-01-08 DIAGNOSIS — J151 Pneumonia due to Pseudomonas: Secondary | ICD-10-CM | POA: Diagnosis not present

## 2015-01-08 DIAGNOSIS — M109 Gout, unspecified: Secondary | ICD-10-CM | POA: Diagnosis not present

## 2015-01-09 DIAGNOSIS — J151 Pneumonia due to Pseudomonas: Secondary | ICD-10-CM | POA: Diagnosis not present

## 2015-01-09 DIAGNOSIS — M109 Gout, unspecified: Secondary | ICD-10-CM | POA: Diagnosis not present

## 2015-01-09 DIAGNOSIS — D539 Nutritional anemia, unspecified: Secondary | ICD-10-CM | POA: Diagnosis not present

## 2015-01-09 DIAGNOSIS — J40 Bronchitis, not specified as acute or chronic: Secondary | ICD-10-CM | POA: Diagnosis not present

## 2015-01-09 DIAGNOSIS — N186 End stage renal disease: Secondary | ICD-10-CM | POA: Diagnosis not present

## 2015-01-10 DIAGNOSIS — M109 Gout, unspecified: Secondary | ICD-10-CM | POA: Diagnosis not present

## 2015-01-10 DIAGNOSIS — D539 Nutritional anemia, unspecified: Secondary | ICD-10-CM | POA: Diagnosis not present

## 2015-01-10 DIAGNOSIS — J151 Pneumonia due to Pseudomonas: Secondary | ICD-10-CM | POA: Diagnosis not present

## 2015-01-10 DIAGNOSIS — J40 Bronchitis, not specified as acute or chronic: Secondary | ICD-10-CM | POA: Diagnosis not present

## 2015-01-11 DIAGNOSIS — J151 Pneumonia due to Pseudomonas: Secondary | ICD-10-CM | POA: Diagnosis not present

## 2015-01-11 DIAGNOSIS — M109 Gout, unspecified: Secondary | ICD-10-CM | POA: Diagnosis not present

## 2015-01-11 DIAGNOSIS — N186 End stage renal disease: Secondary | ICD-10-CM | POA: Diagnosis not present

## 2015-01-11 DIAGNOSIS — J40 Bronchitis, not specified as acute or chronic: Secondary | ICD-10-CM | POA: Diagnosis not present

## 2015-01-11 DIAGNOSIS — D539 Nutritional anemia, unspecified: Secondary | ICD-10-CM | POA: Diagnosis not present

## 2015-01-12 DIAGNOSIS — D539 Nutritional anemia, unspecified: Secondary | ICD-10-CM | POA: Diagnosis not present

## 2015-01-12 DIAGNOSIS — J40 Bronchitis, not specified as acute or chronic: Secondary | ICD-10-CM | POA: Diagnosis not present

## 2015-01-12 DIAGNOSIS — M109 Gout, unspecified: Secondary | ICD-10-CM | POA: Diagnosis not present

## 2015-01-12 DIAGNOSIS — J151 Pneumonia due to Pseudomonas: Secondary | ICD-10-CM | POA: Diagnosis not present

## 2015-01-13 DIAGNOSIS — N186 End stage renal disease: Secondary | ICD-10-CM | POA: Diagnosis not present

## 2015-01-13 DIAGNOSIS — D539 Nutritional anemia, unspecified: Secondary | ICD-10-CM | POA: Diagnosis not present

## 2015-01-13 DIAGNOSIS — J151 Pneumonia due to Pseudomonas: Secondary | ICD-10-CM | POA: Diagnosis not present

## 2015-01-13 DIAGNOSIS — J40 Bronchitis, not specified as acute or chronic: Secondary | ICD-10-CM | POA: Diagnosis not present

## 2015-01-13 DIAGNOSIS — M109 Gout, unspecified: Secondary | ICD-10-CM | POA: Diagnosis not present

## 2015-01-14 DIAGNOSIS — D539 Nutritional anemia, unspecified: Secondary | ICD-10-CM | POA: Diagnosis not present

## 2015-01-14 DIAGNOSIS — J40 Bronchitis, not specified as acute or chronic: Secondary | ICD-10-CM | POA: Diagnosis not present

## 2015-01-14 DIAGNOSIS — J151 Pneumonia due to Pseudomonas: Secondary | ICD-10-CM | POA: Diagnosis not present

## 2015-01-14 DIAGNOSIS — M109 Gout, unspecified: Secondary | ICD-10-CM | POA: Diagnosis not present

## 2015-01-15 DIAGNOSIS — D539 Nutritional anemia, unspecified: Secondary | ICD-10-CM | POA: Diagnosis not present

## 2015-01-15 DIAGNOSIS — J40 Bronchitis, not specified as acute or chronic: Secondary | ICD-10-CM | POA: Diagnosis not present

## 2015-01-15 DIAGNOSIS — M109 Gout, unspecified: Secondary | ICD-10-CM | POA: Diagnosis not present

## 2015-01-15 DIAGNOSIS — J151 Pneumonia due to Pseudomonas: Secondary | ICD-10-CM | POA: Diagnosis not present

## 2015-01-16 DIAGNOSIS — J151 Pneumonia due to Pseudomonas: Secondary | ICD-10-CM | POA: Diagnosis not present

## 2015-01-16 DIAGNOSIS — D539 Nutritional anemia, unspecified: Secondary | ICD-10-CM | POA: Diagnosis not present

## 2015-01-16 DIAGNOSIS — J40 Bronchitis, not specified as acute or chronic: Secondary | ICD-10-CM | POA: Diagnosis not present

## 2015-01-16 DIAGNOSIS — M109 Gout, unspecified: Secondary | ICD-10-CM | POA: Diagnosis not present

## 2015-01-16 DIAGNOSIS — N186 End stage renal disease: Secondary | ICD-10-CM | POA: Diagnosis not present

## 2015-01-17 DIAGNOSIS — M109 Gout, unspecified: Secondary | ICD-10-CM | POA: Diagnosis not present

## 2015-01-17 DIAGNOSIS — J40 Bronchitis, not specified as acute or chronic: Secondary | ICD-10-CM | POA: Diagnosis not present

## 2015-01-17 DIAGNOSIS — J151 Pneumonia due to Pseudomonas: Secondary | ICD-10-CM | POA: Diagnosis not present

## 2015-01-17 DIAGNOSIS — D539 Nutritional anemia, unspecified: Secondary | ICD-10-CM | POA: Diagnosis not present

## 2015-01-18 DIAGNOSIS — N186 End stage renal disease: Secondary | ICD-10-CM | POA: Diagnosis not present

## 2015-01-20 DIAGNOSIS — N186 End stage renal disease: Secondary | ICD-10-CM | POA: Diagnosis not present

## 2015-01-20 DIAGNOSIS — Z992 Dependence on renal dialysis: Secondary | ICD-10-CM | POA: Diagnosis not present

## 2015-01-20 DIAGNOSIS — E1129 Type 2 diabetes mellitus with other diabetic kidney complication: Secondary | ICD-10-CM | POA: Diagnosis not present

## 2015-01-21 DIAGNOSIS — M109 Gout, unspecified: Secondary | ICD-10-CM | POA: Diagnosis not present

## 2015-01-21 DIAGNOSIS — J40 Bronchitis, not specified as acute or chronic: Secondary | ICD-10-CM | POA: Diagnosis not present

## 2015-01-21 DIAGNOSIS — J151 Pneumonia due to Pseudomonas: Secondary | ICD-10-CM | POA: Diagnosis not present

## 2015-01-21 DIAGNOSIS — D539 Nutritional anemia, unspecified: Secondary | ICD-10-CM | POA: Diagnosis not present

## 2015-01-22 DIAGNOSIS — J40 Bronchitis, not specified as acute or chronic: Secondary | ICD-10-CM | POA: Diagnosis not present

## 2015-01-22 DIAGNOSIS — M109 Gout, unspecified: Secondary | ICD-10-CM | POA: Diagnosis not present

## 2015-01-22 DIAGNOSIS — J151 Pneumonia due to Pseudomonas: Secondary | ICD-10-CM | POA: Diagnosis not present

## 2015-01-22 DIAGNOSIS — D539 Nutritional anemia, unspecified: Secondary | ICD-10-CM | POA: Diagnosis not present

## 2015-01-23 DIAGNOSIS — J40 Bronchitis, not specified as acute or chronic: Secondary | ICD-10-CM | POA: Diagnosis not present

## 2015-01-23 DIAGNOSIS — M109 Gout, unspecified: Secondary | ICD-10-CM | POA: Diagnosis not present

## 2015-01-23 DIAGNOSIS — N186 End stage renal disease: Secondary | ICD-10-CM | POA: Diagnosis not present

## 2015-01-23 DIAGNOSIS — D539 Nutritional anemia, unspecified: Secondary | ICD-10-CM | POA: Diagnosis not present

## 2015-01-23 DIAGNOSIS — J151 Pneumonia due to Pseudomonas: Secondary | ICD-10-CM | POA: Diagnosis not present

## 2015-01-24 DIAGNOSIS — M109 Gout, unspecified: Secondary | ICD-10-CM | POA: Diagnosis not present

## 2015-01-24 DIAGNOSIS — J151 Pneumonia due to Pseudomonas: Secondary | ICD-10-CM | POA: Diagnosis not present

## 2015-01-24 DIAGNOSIS — D539 Nutritional anemia, unspecified: Secondary | ICD-10-CM | POA: Diagnosis not present

## 2015-01-24 DIAGNOSIS — J40 Bronchitis, not specified as acute or chronic: Secondary | ICD-10-CM | POA: Diagnosis not present

## 2015-01-25 DIAGNOSIS — M109 Gout, unspecified: Secondary | ICD-10-CM | POA: Diagnosis not present

## 2015-01-25 DIAGNOSIS — D539 Nutritional anemia, unspecified: Secondary | ICD-10-CM | POA: Diagnosis not present

## 2015-01-25 DIAGNOSIS — N186 End stage renal disease: Secondary | ICD-10-CM | POA: Diagnosis not present

## 2015-01-25 DIAGNOSIS — J40 Bronchitis, not specified as acute or chronic: Secondary | ICD-10-CM | POA: Diagnosis not present

## 2015-01-25 DIAGNOSIS — J151 Pneumonia due to Pseudomonas: Secondary | ICD-10-CM | POA: Diagnosis not present

## 2015-01-26 DIAGNOSIS — D539 Nutritional anemia, unspecified: Secondary | ICD-10-CM | POA: Diagnosis not present

## 2015-01-26 DIAGNOSIS — J40 Bronchitis, not specified as acute or chronic: Secondary | ICD-10-CM | POA: Diagnosis not present

## 2015-01-26 DIAGNOSIS — M109 Gout, unspecified: Secondary | ICD-10-CM | POA: Diagnosis not present

## 2015-01-26 DIAGNOSIS — J151 Pneumonia due to Pseudomonas: Secondary | ICD-10-CM | POA: Diagnosis not present

## 2015-01-27 DIAGNOSIS — N186 End stage renal disease: Secondary | ICD-10-CM | POA: Diagnosis not present

## 2015-01-27 DIAGNOSIS — J40 Bronchitis, not specified as acute or chronic: Secondary | ICD-10-CM | POA: Diagnosis not present

## 2015-01-27 DIAGNOSIS — D539 Nutritional anemia, unspecified: Secondary | ICD-10-CM | POA: Diagnosis not present

## 2015-01-27 DIAGNOSIS — M109 Gout, unspecified: Secondary | ICD-10-CM | POA: Diagnosis not present

## 2015-01-27 DIAGNOSIS — J151 Pneumonia due to Pseudomonas: Secondary | ICD-10-CM | POA: Diagnosis not present

## 2015-01-28 DIAGNOSIS — J40 Bronchitis, not specified as acute or chronic: Secondary | ICD-10-CM | POA: Diagnosis not present

## 2015-01-28 DIAGNOSIS — M109 Gout, unspecified: Secondary | ICD-10-CM | POA: Diagnosis not present

## 2015-01-28 DIAGNOSIS — D539 Nutritional anemia, unspecified: Secondary | ICD-10-CM | POA: Diagnosis not present

## 2015-01-28 DIAGNOSIS — J151 Pneumonia due to Pseudomonas: Secondary | ICD-10-CM | POA: Diagnosis not present

## 2015-01-29 DIAGNOSIS — J40 Bronchitis, not specified as acute or chronic: Secondary | ICD-10-CM | POA: Diagnosis not present

## 2015-01-29 DIAGNOSIS — J151 Pneumonia due to Pseudomonas: Secondary | ICD-10-CM | POA: Diagnosis not present

## 2015-01-29 DIAGNOSIS — M109 Gout, unspecified: Secondary | ICD-10-CM | POA: Diagnosis not present

## 2015-01-29 DIAGNOSIS — D539 Nutritional anemia, unspecified: Secondary | ICD-10-CM | POA: Diagnosis not present

## 2015-01-30 DIAGNOSIS — D539 Nutritional anemia, unspecified: Secondary | ICD-10-CM | POA: Diagnosis not present

## 2015-01-30 DIAGNOSIS — J151 Pneumonia due to Pseudomonas: Secondary | ICD-10-CM | POA: Diagnosis not present

## 2015-01-30 DIAGNOSIS — N186 End stage renal disease: Secondary | ICD-10-CM | POA: Diagnosis not present

## 2015-01-30 DIAGNOSIS — M109 Gout, unspecified: Secondary | ICD-10-CM | POA: Diagnosis not present

## 2015-01-30 DIAGNOSIS — J40 Bronchitis, not specified as acute or chronic: Secondary | ICD-10-CM | POA: Diagnosis not present

## 2015-01-31 DIAGNOSIS — D539 Nutritional anemia, unspecified: Secondary | ICD-10-CM | POA: Diagnosis not present

## 2015-01-31 DIAGNOSIS — J151 Pneumonia due to Pseudomonas: Secondary | ICD-10-CM | POA: Diagnosis not present

## 2015-01-31 DIAGNOSIS — J40 Bronchitis, not specified as acute or chronic: Secondary | ICD-10-CM | POA: Diagnosis not present

## 2015-01-31 DIAGNOSIS — M109 Gout, unspecified: Secondary | ICD-10-CM | POA: Diagnosis not present

## 2015-02-01 DIAGNOSIS — D539 Nutritional anemia, unspecified: Secondary | ICD-10-CM | POA: Diagnosis not present

## 2015-02-01 DIAGNOSIS — M109 Gout, unspecified: Secondary | ICD-10-CM | POA: Diagnosis not present

## 2015-02-01 DIAGNOSIS — J40 Bronchitis, not specified as acute or chronic: Secondary | ICD-10-CM | POA: Diagnosis not present

## 2015-02-01 DIAGNOSIS — N186 End stage renal disease: Secondary | ICD-10-CM | POA: Diagnosis not present

## 2015-02-01 DIAGNOSIS — J151 Pneumonia due to Pseudomonas: Secondary | ICD-10-CM | POA: Diagnosis not present

## 2015-02-02 DIAGNOSIS — J40 Bronchitis, not specified as acute or chronic: Secondary | ICD-10-CM | POA: Diagnosis not present

## 2015-02-02 DIAGNOSIS — M109 Gout, unspecified: Secondary | ICD-10-CM | POA: Diagnosis not present

## 2015-02-02 DIAGNOSIS — J151 Pneumonia due to Pseudomonas: Secondary | ICD-10-CM | POA: Diagnosis not present

## 2015-02-02 DIAGNOSIS — D539 Nutritional anemia, unspecified: Secondary | ICD-10-CM | POA: Diagnosis not present

## 2015-02-03 DIAGNOSIS — J151 Pneumonia due to Pseudomonas: Secondary | ICD-10-CM | POA: Diagnosis not present

## 2015-02-03 DIAGNOSIS — J40 Bronchitis, not specified as acute or chronic: Secondary | ICD-10-CM | POA: Diagnosis not present

## 2015-02-03 DIAGNOSIS — M109 Gout, unspecified: Secondary | ICD-10-CM | POA: Diagnosis not present

## 2015-02-03 DIAGNOSIS — N186 End stage renal disease: Secondary | ICD-10-CM | POA: Diagnosis not present

## 2015-02-03 DIAGNOSIS — D539 Nutritional anemia, unspecified: Secondary | ICD-10-CM | POA: Diagnosis not present

## 2015-02-04 DIAGNOSIS — D539 Nutritional anemia, unspecified: Secondary | ICD-10-CM | POA: Diagnosis not present

## 2015-02-04 DIAGNOSIS — M109 Gout, unspecified: Secondary | ICD-10-CM | POA: Diagnosis not present

## 2015-02-04 DIAGNOSIS — J40 Bronchitis, not specified as acute or chronic: Secondary | ICD-10-CM | POA: Diagnosis not present

## 2015-02-04 DIAGNOSIS — J151 Pneumonia due to Pseudomonas: Secondary | ICD-10-CM | POA: Diagnosis not present

## 2015-02-05 DIAGNOSIS — M109 Gout, unspecified: Secondary | ICD-10-CM | POA: Diagnosis not present

## 2015-02-05 DIAGNOSIS — J151 Pneumonia due to Pseudomonas: Secondary | ICD-10-CM | POA: Diagnosis not present

## 2015-02-05 DIAGNOSIS — D539 Nutritional anemia, unspecified: Secondary | ICD-10-CM | POA: Diagnosis not present

## 2015-02-05 DIAGNOSIS — J40 Bronchitis, not specified as acute or chronic: Secondary | ICD-10-CM | POA: Diagnosis not present

## 2015-02-06 DIAGNOSIS — J40 Bronchitis, not specified as acute or chronic: Secondary | ICD-10-CM | POA: Diagnosis not present

## 2015-02-06 DIAGNOSIS — J151 Pneumonia due to Pseudomonas: Secondary | ICD-10-CM | POA: Diagnosis not present

## 2015-02-06 DIAGNOSIS — D539 Nutritional anemia, unspecified: Secondary | ICD-10-CM | POA: Diagnosis not present

## 2015-02-06 DIAGNOSIS — M109 Gout, unspecified: Secondary | ICD-10-CM | POA: Diagnosis not present

## 2015-02-06 DIAGNOSIS — N186 End stage renal disease: Secondary | ICD-10-CM | POA: Diagnosis not present

## 2015-02-07 DIAGNOSIS — M109 Gout, unspecified: Secondary | ICD-10-CM | POA: Diagnosis not present

## 2015-02-07 DIAGNOSIS — J151 Pneumonia due to Pseudomonas: Secondary | ICD-10-CM | POA: Diagnosis not present

## 2015-02-07 DIAGNOSIS — D539 Nutritional anemia, unspecified: Secondary | ICD-10-CM | POA: Diagnosis not present

## 2015-02-07 DIAGNOSIS — J40 Bronchitis, not specified as acute or chronic: Secondary | ICD-10-CM | POA: Diagnosis not present

## 2015-02-08 DIAGNOSIS — D539 Nutritional anemia, unspecified: Secondary | ICD-10-CM | POA: Diagnosis not present

## 2015-02-08 DIAGNOSIS — M109 Gout, unspecified: Secondary | ICD-10-CM | POA: Diagnosis not present

## 2015-02-08 DIAGNOSIS — J151 Pneumonia due to Pseudomonas: Secondary | ICD-10-CM | POA: Diagnosis not present

## 2015-02-08 DIAGNOSIS — N186 End stage renal disease: Secondary | ICD-10-CM | POA: Diagnosis not present

## 2015-02-08 DIAGNOSIS — J40 Bronchitis, not specified as acute or chronic: Secondary | ICD-10-CM | POA: Diagnosis not present

## 2015-02-09 DIAGNOSIS — M109 Gout, unspecified: Secondary | ICD-10-CM | POA: Diagnosis not present

## 2015-02-09 DIAGNOSIS — D539 Nutritional anemia, unspecified: Secondary | ICD-10-CM | POA: Diagnosis not present

## 2015-02-09 DIAGNOSIS — J40 Bronchitis, not specified as acute or chronic: Secondary | ICD-10-CM | POA: Diagnosis not present

## 2015-02-09 DIAGNOSIS — J151 Pneumonia due to Pseudomonas: Secondary | ICD-10-CM | POA: Diagnosis not present

## 2015-02-10 DIAGNOSIS — J151 Pneumonia due to Pseudomonas: Secondary | ICD-10-CM | POA: Diagnosis not present

## 2015-02-10 DIAGNOSIS — J40 Bronchitis, not specified as acute or chronic: Secondary | ICD-10-CM | POA: Diagnosis not present

## 2015-02-10 DIAGNOSIS — D539 Nutritional anemia, unspecified: Secondary | ICD-10-CM | POA: Diagnosis not present

## 2015-02-10 DIAGNOSIS — N186 End stage renal disease: Secondary | ICD-10-CM | POA: Diagnosis not present

## 2015-02-10 DIAGNOSIS — M109 Gout, unspecified: Secondary | ICD-10-CM | POA: Diagnosis not present

## 2015-02-11 DIAGNOSIS — J151 Pneumonia due to Pseudomonas: Secondary | ICD-10-CM | POA: Diagnosis not present

## 2015-02-11 DIAGNOSIS — J40 Bronchitis, not specified as acute or chronic: Secondary | ICD-10-CM | POA: Diagnosis not present

## 2015-02-11 DIAGNOSIS — D539 Nutritional anemia, unspecified: Secondary | ICD-10-CM | POA: Diagnosis not present

## 2015-02-11 DIAGNOSIS — M109 Gout, unspecified: Secondary | ICD-10-CM | POA: Diagnosis not present

## 2015-02-12 DIAGNOSIS — M109 Gout, unspecified: Secondary | ICD-10-CM | POA: Diagnosis not present

## 2015-02-12 DIAGNOSIS — J40 Bronchitis, not specified as acute or chronic: Secondary | ICD-10-CM | POA: Diagnosis not present

## 2015-02-12 DIAGNOSIS — D539 Nutritional anemia, unspecified: Secondary | ICD-10-CM | POA: Diagnosis not present

## 2015-02-12 DIAGNOSIS — J151 Pneumonia due to Pseudomonas: Secondary | ICD-10-CM | POA: Diagnosis not present

## 2015-02-13 DIAGNOSIS — N186 End stage renal disease: Secondary | ICD-10-CM | POA: Diagnosis not present

## 2015-02-13 DIAGNOSIS — J151 Pneumonia due to Pseudomonas: Secondary | ICD-10-CM | POA: Diagnosis not present

## 2015-02-13 DIAGNOSIS — J40 Bronchitis, not specified as acute or chronic: Secondary | ICD-10-CM | POA: Diagnosis not present

## 2015-02-13 DIAGNOSIS — M109 Gout, unspecified: Secondary | ICD-10-CM | POA: Diagnosis not present

## 2015-02-13 DIAGNOSIS — D539 Nutritional anemia, unspecified: Secondary | ICD-10-CM | POA: Diagnosis not present

## 2015-02-14 DIAGNOSIS — M109 Gout, unspecified: Secondary | ICD-10-CM | POA: Diagnosis not present

## 2015-02-14 DIAGNOSIS — J40 Bronchitis, not specified as acute or chronic: Secondary | ICD-10-CM | POA: Diagnosis not present

## 2015-02-14 DIAGNOSIS — J151 Pneumonia due to Pseudomonas: Secondary | ICD-10-CM | POA: Diagnosis not present

## 2015-02-14 DIAGNOSIS — D539 Nutritional anemia, unspecified: Secondary | ICD-10-CM | POA: Diagnosis not present

## 2015-02-15 DIAGNOSIS — M109 Gout, unspecified: Secondary | ICD-10-CM | POA: Diagnosis not present

## 2015-02-15 DIAGNOSIS — J40 Bronchitis, not specified as acute or chronic: Secondary | ICD-10-CM | POA: Diagnosis not present

## 2015-02-15 DIAGNOSIS — N186 End stage renal disease: Secondary | ICD-10-CM | POA: Diagnosis not present

## 2015-02-15 DIAGNOSIS — D539 Nutritional anemia, unspecified: Secondary | ICD-10-CM | POA: Diagnosis not present

## 2015-02-15 DIAGNOSIS — J151 Pneumonia due to Pseudomonas: Secondary | ICD-10-CM | POA: Diagnosis not present

## 2015-02-16 DIAGNOSIS — D539 Nutritional anemia, unspecified: Secondary | ICD-10-CM | POA: Diagnosis not present

## 2015-02-16 DIAGNOSIS — M109 Gout, unspecified: Secondary | ICD-10-CM | POA: Diagnosis not present

## 2015-02-16 DIAGNOSIS — J40 Bronchitis, not specified as acute or chronic: Secondary | ICD-10-CM | POA: Diagnosis not present

## 2015-02-16 DIAGNOSIS — J151 Pneumonia due to Pseudomonas: Secondary | ICD-10-CM | POA: Diagnosis not present

## 2015-02-17 DIAGNOSIS — J151 Pneumonia due to Pseudomonas: Secondary | ICD-10-CM | POA: Diagnosis not present

## 2015-02-17 DIAGNOSIS — M109 Gout, unspecified: Secondary | ICD-10-CM | POA: Diagnosis not present

## 2015-02-17 DIAGNOSIS — N186 End stage renal disease: Secondary | ICD-10-CM | POA: Diagnosis not present

## 2015-02-17 DIAGNOSIS — D539 Nutritional anemia, unspecified: Secondary | ICD-10-CM | POA: Diagnosis not present

## 2015-02-17 DIAGNOSIS — J40 Bronchitis, not specified as acute or chronic: Secondary | ICD-10-CM | POA: Diagnosis not present

## 2015-02-20 DIAGNOSIS — N186 End stage renal disease: Secondary | ICD-10-CM | POA: Diagnosis not present

## 2015-02-20 DIAGNOSIS — E1129 Type 2 diabetes mellitus with other diabetic kidney complication: Secondary | ICD-10-CM | POA: Diagnosis not present

## 2015-02-20 DIAGNOSIS — Z992 Dependence on renal dialysis: Secondary | ICD-10-CM | POA: Diagnosis not present

## 2015-02-21 DIAGNOSIS — D539 Nutritional anemia, unspecified: Secondary | ICD-10-CM | POA: Diagnosis not present

## 2015-02-21 DIAGNOSIS — J151 Pneumonia due to Pseudomonas: Secondary | ICD-10-CM | POA: Diagnosis not present

## 2015-02-21 DIAGNOSIS — M109 Gout, unspecified: Secondary | ICD-10-CM | POA: Diagnosis not present

## 2015-02-21 DIAGNOSIS — J40 Bronchitis, not specified as acute or chronic: Secondary | ICD-10-CM | POA: Diagnosis not present

## 2015-02-22 DIAGNOSIS — N186 End stage renal disease: Secondary | ICD-10-CM | POA: Diagnosis not present

## 2015-02-22 DIAGNOSIS — J40 Bronchitis, not specified as acute or chronic: Secondary | ICD-10-CM | POA: Diagnosis not present

## 2015-02-22 DIAGNOSIS — D539 Nutritional anemia, unspecified: Secondary | ICD-10-CM | POA: Diagnosis not present

## 2015-02-22 DIAGNOSIS — M109 Gout, unspecified: Secondary | ICD-10-CM | POA: Diagnosis not present

## 2015-02-22 DIAGNOSIS — J151 Pneumonia due to Pseudomonas: Secondary | ICD-10-CM | POA: Diagnosis not present

## 2015-02-23 DIAGNOSIS — J151 Pneumonia due to Pseudomonas: Secondary | ICD-10-CM | POA: Diagnosis not present

## 2015-02-23 DIAGNOSIS — M109 Gout, unspecified: Secondary | ICD-10-CM | POA: Diagnosis not present

## 2015-02-23 DIAGNOSIS — J40 Bronchitis, not specified as acute or chronic: Secondary | ICD-10-CM | POA: Diagnosis not present

## 2015-02-23 DIAGNOSIS — D539 Nutritional anemia, unspecified: Secondary | ICD-10-CM | POA: Diagnosis not present

## 2015-02-24 DIAGNOSIS — N186 End stage renal disease: Secondary | ICD-10-CM | POA: Diagnosis not present

## 2015-02-26 DIAGNOSIS — M109 Gout, unspecified: Secondary | ICD-10-CM | POA: Diagnosis not present

## 2015-02-27 DIAGNOSIS — N186 End stage renal disease: Secondary | ICD-10-CM | POA: Diagnosis not present

## 2015-02-27 DIAGNOSIS — M109 Gout, unspecified: Secondary | ICD-10-CM | POA: Diagnosis not present

## 2015-02-28 DIAGNOSIS — M109 Gout, unspecified: Secondary | ICD-10-CM | POA: Diagnosis not present

## 2015-03-01 DIAGNOSIS — M109 Gout, unspecified: Secondary | ICD-10-CM | POA: Diagnosis not present

## 2015-03-01 DIAGNOSIS — N186 End stage renal disease: Secondary | ICD-10-CM | POA: Diagnosis not present

## 2015-03-02 DIAGNOSIS — M109 Gout, unspecified: Secondary | ICD-10-CM | POA: Diagnosis not present

## 2015-03-02 DIAGNOSIS — N186 End stage renal disease: Secondary | ICD-10-CM | POA: Diagnosis not present

## 2015-03-05 DIAGNOSIS — M109 Gout, unspecified: Secondary | ICD-10-CM | POA: Diagnosis not present

## 2015-03-06 DIAGNOSIS — M109 Gout, unspecified: Secondary | ICD-10-CM | POA: Diagnosis not present

## 2015-03-06 DIAGNOSIS — N186 End stage renal disease: Secondary | ICD-10-CM | POA: Diagnosis not present

## 2015-03-07 DIAGNOSIS — M109 Gout, unspecified: Secondary | ICD-10-CM | POA: Diagnosis not present

## 2015-03-08 DIAGNOSIS — M109 Gout, unspecified: Secondary | ICD-10-CM | POA: Diagnosis not present

## 2015-03-08 DIAGNOSIS — N186 End stage renal disease: Secondary | ICD-10-CM | POA: Diagnosis not present

## 2015-03-09 DIAGNOSIS — M109 Gout, unspecified: Secondary | ICD-10-CM | POA: Diagnosis not present

## 2015-03-10 DIAGNOSIS — N186 End stage renal disease: Secondary | ICD-10-CM | POA: Diagnosis not present

## 2015-03-12 DIAGNOSIS — M109 Gout, unspecified: Secondary | ICD-10-CM | POA: Diagnosis not present

## 2015-03-13 DIAGNOSIS — N186 End stage renal disease: Secondary | ICD-10-CM | POA: Diagnosis not present

## 2015-03-13 DIAGNOSIS — M109 Gout, unspecified: Secondary | ICD-10-CM | POA: Diagnosis not present

## 2015-03-14 DIAGNOSIS — M109 Gout, unspecified: Secondary | ICD-10-CM | POA: Diagnosis not present

## 2015-03-15 DIAGNOSIS — N186 End stage renal disease: Secondary | ICD-10-CM | POA: Diagnosis not present

## 2015-03-15 DIAGNOSIS — M109 Gout, unspecified: Secondary | ICD-10-CM | POA: Diagnosis not present

## 2015-03-16 DIAGNOSIS — M109 Gout, unspecified: Secondary | ICD-10-CM | POA: Diagnosis not present

## 2015-03-17 DIAGNOSIS — N186 End stage renal disease: Secondary | ICD-10-CM | POA: Diagnosis not present

## 2015-03-20 DIAGNOSIS — Z992 Dependence on renal dialysis: Secondary | ICD-10-CM | POA: Diagnosis not present

## 2015-03-20 DIAGNOSIS — N186 End stage renal disease: Secondary | ICD-10-CM | POA: Diagnosis not present

## 2015-03-20 DIAGNOSIS — E1129 Type 2 diabetes mellitus with other diabetic kidney complication: Secondary | ICD-10-CM | POA: Diagnosis not present

## 2015-03-22 DIAGNOSIS — N186 End stage renal disease: Secondary | ICD-10-CM | POA: Diagnosis not present

## 2015-03-24 DIAGNOSIS — N186 End stage renal disease: Secondary | ICD-10-CM | POA: Diagnosis not present

## 2015-03-26 DIAGNOSIS — E1140 Type 2 diabetes mellitus with diabetic neuropathy, unspecified: Secondary | ICD-10-CM | POA: Diagnosis not present

## 2015-03-26 DIAGNOSIS — I5022 Chronic systolic (congestive) heart failure: Secondary | ICD-10-CM | POA: Diagnosis not present

## 2015-03-26 DIAGNOSIS — N186 End stage renal disease: Secondary | ICD-10-CM | POA: Diagnosis not present

## 2015-03-26 DIAGNOSIS — I2510 Atherosclerotic heart disease of native coronary artery without angina pectoris: Secondary | ICD-10-CM | POA: Diagnosis not present

## 2015-03-27 DIAGNOSIS — N186 End stage renal disease: Secondary | ICD-10-CM | POA: Diagnosis not present

## 2015-03-29 DIAGNOSIS — N186 End stage renal disease: Secondary | ICD-10-CM | POA: Diagnosis not present

## 2015-03-31 DIAGNOSIS — N186 End stage renal disease: Secondary | ICD-10-CM | POA: Diagnosis not present

## 2015-04-02 DIAGNOSIS — Z6841 Body Mass Index (BMI) 40.0 and over, adult: Secondary | ICD-10-CM | POA: Diagnosis not present

## 2015-04-02 DIAGNOSIS — I120 Hypertensive chronic kidney disease with stage 5 chronic kidney disease or end stage renal disease: Secondary | ICD-10-CM | POA: Diagnosis not present

## 2015-04-02 DIAGNOSIS — I10 Essential (primary) hypertension: Secondary | ICD-10-CM | POA: Diagnosis not present

## 2015-04-02 DIAGNOSIS — E784 Other hyperlipidemia: Secondary | ICD-10-CM | POA: Diagnosis not present

## 2015-04-02 DIAGNOSIS — R109 Unspecified abdominal pain: Secondary | ICD-10-CM | POA: Diagnosis not present

## 2015-04-02 DIAGNOSIS — N186 End stage renal disease: Secondary | ICD-10-CM | POA: Diagnosis not present

## 2015-04-02 DIAGNOSIS — E1140 Type 2 diabetes mellitus with diabetic neuropathy, unspecified: Secondary | ICD-10-CM | POA: Diagnosis not present

## 2015-04-03 ENCOUNTER — Other Ambulatory Visit: Payer: Self-pay | Admitting: Internal Medicine

## 2015-04-03 DIAGNOSIS — R1084 Generalized abdominal pain: Secondary | ICD-10-CM

## 2015-04-03 DIAGNOSIS — N186 End stage renal disease: Secondary | ICD-10-CM | POA: Diagnosis not present

## 2015-04-05 DIAGNOSIS — N186 End stage renal disease: Secondary | ICD-10-CM | POA: Diagnosis not present

## 2015-04-07 DIAGNOSIS — N186 End stage renal disease: Secondary | ICD-10-CM | POA: Diagnosis not present

## 2015-04-09 DIAGNOSIS — M109 Gout, unspecified: Secondary | ICD-10-CM | POA: Diagnosis not present

## 2015-04-10 ENCOUNTER — Other Ambulatory Visit: Payer: Commercial Managed Care - HMO

## 2015-04-10 DIAGNOSIS — M109 Gout, unspecified: Secondary | ICD-10-CM | POA: Diagnosis not present

## 2015-04-10 DIAGNOSIS — N186 End stage renal disease: Secondary | ICD-10-CM | POA: Diagnosis not present

## 2015-04-11 ENCOUNTER — Ambulatory Visit
Admission: RE | Admit: 2015-04-11 | Discharge: 2015-04-11 | Disposition: A | Discharge status: Home / Self Care | Payer: Commercial Managed Care - HMO | Source: Ambulatory Visit | Attending: Internal Medicine | Admitting: Internal Medicine

## 2015-04-11 DIAGNOSIS — R1030 Lower abdominal pain, unspecified: Secondary | ICD-10-CM | POA: Diagnosis not present

## 2015-04-11 DIAGNOSIS — M109 Gout, unspecified: Secondary | ICD-10-CM | POA: Diagnosis not present

## 2015-04-11 DIAGNOSIS — R1084 Generalized abdominal pain: Secondary | ICD-10-CM

## 2015-04-12 DIAGNOSIS — M109 Gout, unspecified: Secondary | ICD-10-CM | POA: Diagnosis not present

## 2015-04-12 DIAGNOSIS — N186 End stage renal disease: Secondary | ICD-10-CM | POA: Diagnosis not present

## 2015-04-13 DIAGNOSIS — M109 Gout, unspecified: Secondary | ICD-10-CM | POA: Diagnosis not present

## 2015-04-14 DIAGNOSIS — M109 Gout, unspecified: Secondary | ICD-10-CM | POA: Diagnosis not present

## 2015-04-14 DIAGNOSIS — N186 End stage renal disease: Secondary | ICD-10-CM | POA: Diagnosis not present

## 2015-04-15 DIAGNOSIS — M109 Gout, unspecified: Secondary | ICD-10-CM | POA: Diagnosis not present

## 2015-04-16 DIAGNOSIS — M109 Gout, unspecified: Secondary | ICD-10-CM | POA: Diagnosis not present

## 2015-04-17 DIAGNOSIS — N186 End stage renal disease: Secondary | ICD-10-CM | POA: Diagnosis not present

## 2015-04-17 DIAGNOSIS — M109 Gout, unspecified: Secondary | ICD-10-CM | POA: Diagnosis not present

## 2015-04-18 DIAGNOSIS — N186 End stage renal disease: Secondary | ICD-10-CM | POA: Diagnosis not present

## 2015-04-18 DIAGNOSIS — M109 Gout, unspecified: Secondary | ICD-10-CM | POA: Diagnosis not present

## 2015-04-19 DIAGNOSIS — N186 End stage renal disease: Secondary | ICD-10-CM | POA: Diagnosis not present

## 2015-04-19 DIAGNOSIS — M109 Gout, unspecified: Secondary | ICD-10-CM | POA: Diagnosis not present

## 2015-04-20 DIAGNOSIS — E1129 Type 2 diabetes mellitus with other diabetic kidney complication: Secondary | ICD-10-CM | POA: Diagnosis not present

## 2015-04-20 DIAGNOSIS — Z992 Dependence on renal dialysis: Secondary | ICD-10-CM | POA: Diagnosis not present

## 2015-04-20 DIAGNOSIS — N186 End stage renal disease: Secondary | ICD-10-CM | POA: Diagnosis not present

## 2015-04-20 DIAGNOSIS — M109 Gout, unspecified: Secondary | ICD-10-CM | POA: Diagnosis not present

## 2015-04-21 DIAGNOSIS — N186 End stage renal disease: Secondary | ICD-10-CM | POA: Diagnosis not present

## 2015-04-21 DIAGNOSIS — M109 Gout, unspecified: Secondary | ICD-10-CM | POA: Diagnosis not present

## 2015-04-22 DIAGNOSIS — M109 Gout, unspecified: Secondary | ICD-10-CM | POA: Diagnosis not present

## 2015-04-23 DIAGNOSIS — M109 Gout, unspecified: Secondary | ICD-10-CM | POA: Diagnosis not present

## 2015-04-24 DIAGNOSIS — M109 Gout, unspecified: Secondary | ICD-10-CM | POA: Diagnosis not present

## 2015-04-24 DIAGNOSIS — N186 End stage renal disease: Secondary | ICD-10-CM | POA: Diagnosis not present

## 2015-04-25 DIAGNOSIS — M109 Gout, unspecified: Secondary | ICD-10-CM | POA: Diagnosis not present

## 2015-04-26 DIAGNOSIS — M109 Gout, unspecified: Secondary | ICD-10-CM | POA: Diagnosis not present

## 2015-04-26 DIAGNOSIS — N186 End stage renal disease: Secondary | ICD-10-CM | POA: Diagnosis not present

## 2015-04-27 DIAGNOSIS — M109 Gout, unspecified: Secondary | ICD-10-CM | POA: Diagnosis not present

## 2015-04-28 DIAGNOSIS — N186 End stage renal disease: Secondary | ICD-10-CM | POA: Diagnosis not present

## 2015-04-28 DIAGNOSIS — M109 Gout, unspecified: Secondary | ICD-10-CM | POA: Diagnosis not present

## 2015-04-29 DIAGNOSIS — M109 Gout, unspecified: Secondary | ICD-10-CM | POA: Diagnosis not present

## 2015-04-30 DIAGNOSIS — M109 Gout, unspecified: Secondary | ICD-10-CM | POA: Diagnosis not present

## 2015-05-01 DIAGNOSIS — M109 Gout, unspecified: Secondary | ICD-10-CM | POA: Diagnosis not present

## 2015-05-01 DIAGNOSIS — N186 End stage renal disease: Secondary | ICD-10-CM | POA: Diagnosis not present

## 2015-05-02 DIAGNOSIS — M109 Gout, unspecified: Secondary | ICD-10-CM | POA: Diagnosis not present

## 2015-05-03 DIAGNOSIS — M109 Gout, unspecified: Secondary | ICD-10-CM | POA: Diagnosis not present

## 2015-05-03 DIAGNOSIS — N186 End stage renal disease: Secondary | ICD-10-CM | POA: Diagnosis not present

## 2015-05-04 DIAGNOSIS — M109 Gout, unspecified: Secondary | ICD-10-CM | POA: Diagnosis not present

## 2015-05-05 DIAGNOSIS — N186 End stage renal disease: Secondary | ICD-10-CM | POA: Diagnosis not present

## 2015-05-05 DIAGNOSIS — M109 Gout, unspecified: Secondary | ICD-10-CM | POA: Diagnosis not present

## 2015-05-06 DIAGNOSIS — M109 Gout, unspecified: Secondary | ICD-10-CM | POA: Diagnosis not present

## 2015-05-07 DIAGNOSIS — M109 Gout, unspecified: Secondary | ICD-10-CM | POA: Diagnosis not present

## 2015-05-08 DIAGNOSIS — N186 End stage renal disease: Secondary | ICD-10-CM | POA: Diagnosis not present

## 2015-05-08 DIAGNOSIS — M109 Gout, unspecified: Secondary | ICD-10-CM | POA: Diagnosis not present

## 2015-05-09 DIAGNOSIS — M109 Gout, unspecified: Secondary | ICD-10-CM | POA: Diagnosis not present

## 2015-05-10 DIAGNOSIS — M109 Gout, unspecified: Secondary | ICD-10-CM | POA: Diagnosis not present

## 2015-05-10 DIAGNOSIS — N186 End stage renal disease: Secondary | ICD-10-CM | POA: Diagnosis not present

## 2015-05-11 DIAGNOSIS — M109 Gout, unspecified: Secondary | ICD-10-CM | POA: Diagnosis not present

## 2015-05-12 DIAGNOSIS — M109 Gout, unspecified: Secondary | ICD-10-CM | POA: Diagnosis not present

## 2015-05-12 DIAGNOSIS — N186 End stage renal disease: Secondary | ICD-10-CM | POA: Diagnosis not present

## 2015-05-13 DIAGNOSIS — M109 Gout, unspecified: Secondary | ICD-10-CM | POA: Diagnosis not present

## 2015-05-14 DIAGNOSIS — M109 Gout, unspecified: Secondary | ICD-10-CM | POA: Diagnosis not present

## 2015-05-15 DIAGNOSIS — M109 Gout, unspecified: Secondary | ICD-10-CM | POA: Diagnosis not present

## 2015-05-15 DIAGNOSIS — N186 End stage renal disease: Secondary | ICD-10-CM | POA: Diagnosis not present

## 2015-05-16 DIAGNOSIS — Z961 Presence of intraocular lens: Secondary | ICD-10-CM | POA: Diagnosis not present

## 2015-05-16 DIAGNOSIS — H40022 Open angle with borderline findings, high risk, left eye: Secondary | ICD-10-CM | POA: Diagnosis not present

## 2015-05-16 DIAGNOSIS — Z970 Presence of artificial eye: Secondary | ICD-10-CM | POA: Diagnosis not present

## 2015-05-16 DIAGNOSIS — M109 Gout, unspecified: Secondary | ICD-10-CM | POA: Diagnosis not present

## 2015-05-17 DIAGNOSIS — N186 End stage renal disease: Secondary | ICD-10-CM | POA: Diagnosis not present

## 2015-05-17 DIAGNOSIS — M109 Gout, unspecified: Secondary | ICD-10-CM | POA: Diagnosis not present

## 2015-05-18 DIAGNOSIS — M859 Disorder of bone density and structure, unspecified: Secondary | ICD-10-CM | POA: Diagnosis not present

## 2015-05-18 DIAGNOSIS — E784 Other hyperlipidemia: Secondary | ICD-10-CM | POA: Diagnosis not present

## 2015-05-18 DIAGNOSIS — E119 Type 2 diabetes mellitus without complications: Secondary | ICD-10-CM | POA: Diagnosis not present

## 2015-05-18 DIAGNOSIS — M109 Gout, unspecified: Secondary | ICD-10-CM | POA: Diagnosis not present

## 2015-05-19 DIAGNOSIS — M109 Gout, unspecified: Secondary | ICD-10-CM | POA: Diagnosis not present

## 2015-05-19 DIAGNOSIS — N186 End stage renal disease: Secondary | ICD-10-CM | POA: Diagnosis not present

## 2015-05-20 DIAGNOSIS — E1129 Type 2 diabetes mellitus with other diabetic kidney complication: Secondary | ICD-10-CM | POA: Diagnosis not present

## 2015-05-20 DIAGNOSIS — Z992 Dependence on renal dialysis: Secondary | ICD-10-CM | POA: Diagnosis not present

## 2015-05-20 DIAGNOSIS — N186 End stage renal disease: Secondary | ICD-10-CM | POA: Diagnosis not present

## 2015-05-20 DIAGNOSIS — M109 Gout, unspecified: Secondary | ICD-10-CM | POA: Diagnosis not present

## 2015-05-21 DIAGNOSIS — M109 Gout, unspecified: Secondary | ICD-10-CM | POA: Diagnosis not present

## 2015-05-22 DIAGNOSIS — M109 Gout, unspecified: Secondary | ICD-10-CM | POA: Diagnosis not present

## 2015-05-22 DIAGNOSIS — N186 End stage renal disease: Secondary | ICD-10-CM | POA: Diagnosis not present

## 2015-05-23 DIAGNOSIS — Z0000 Encounter for general adult medical examination without abnormal findings: Secondary | ICD-10-CM | POA: Diagnosis not present

## 2015-05-23 DIAGNOSIS — M109 Gout, unspecified: Secondary | ICD-10-CM | POA: Diagnosis not present

## 2015-05-23 DIAGNOSIS — I120 Hypertensive chronic kidney disease with stage 5 chronic kidney disease or end stage renal disease: Secondary | ICD-10-CM | POA: Diagnosis not present

## 2015-05-23 DIAGNOSIS — E1140 Type 2 diabetes mellitus with diabetic neuropathy, unspecified: Secondary | ICD-10-CM | POA: Diagnosis not present

## 2015-05-23 DIAGNOSIS — D6489 Other specified anemias: Secondary | ICD-10-CM | POA: Diagnosis not present

## 2015-05-23 DIAGNOSIS — M25561 Pain in right knee: Secondary | ICD-10-CM | POA: Diagnosis not present

## 2015-05-23 DIAGNOSIS — N186 End stage renal disease: Secondary | ICD-10-CM | POA: Diagnosis not present

## 2015-05-23 DIAGNOSIS — M25562 Pain in left knee: Secondary | ICD-10-CM | POA: Diagnosis not present

## 2015-05-23 DIAGNOSIS — I509 Heart failure, unspecified: Secondary | ICD-10-CM | POA: Diagnosis not present

## 2015-05-24 DIAGNOSIS — N186 End stage renal disease: Secondary | ICD-10-CM | POA: Diagnosis not present

## 2015-05-24 DIAGNOSIS — M109 Gout, unspecified: Secondary | ICD-10-CM | POA: Diagnosis not present

## 2015-05-25 DIAGNOSIS — Z1212 Encounter for screening for malignant neoplasm of rectum: Secondary | ICD-10-CM | POA: Diagnosis not present

## 2015-05-25 DIAGNOSIS — M109 Gout, unspecified: Secondary | ICD-10-CM | POA: Diagnosis not present

## 2015-05-26 DIAGNOSIS — N186 End stage renal disease: Secondary | ICD-10-CM | POA: Diagnosis not present

## 2015-05-28 DIAGNOSIS — M109 Gout, unspecified: Secondary | ICD-10-CM | POA: Diagnosis not present

## 2015-05-29 DIAGNOSIS — M109 Gout, unspecified: Secondary | ICD-10-CM | POA: Diagnosis not present

## 2015-05-29 DIAGNOSIS — N186 End stage renal disease: Secondary | ICD-10-CM | POA: Diagnosis not present

## 2015-05-30 DIAGNOSIS — M109 Gout, unspecified: Secondary | ICD-10-CM | POA: Diagnosis not present

## 2015-05-31 ENCOUNTER — Encounter: Payer: Self-pay | Admitting: Internal Medicine

## 2015-05-31 DIAGNOSIS — N186 End stage renal disease: Secondary | ICD-10-CM | POA: Diagnosis not present

## 2015-06-01 DIAGNOSIS — T82868D Thrombosis of vascular prosthetic devices, implants and grafts, subsequent encounter: Secondary | ICD-10-CM | POA: Diagnosis not present

## 2015-06-01 DIAGNOSIS — Z992 Dependence on renal dialysis: Secondary | ICD-10-CM | POA: Diagnosis not present

## 2015-06-01 DIAGNOSIS — N186 End stage renal disease: Secondary | ICD-10-CM | POA: Diagnosis not present

## 2015-06-01 DIAGNOSIS — I871 Compression of vein: Secondary | ICD-10-CM | POA: Diagnosis not present

## 2015-06-02 DIAGNOSIS — N186 End stage renal disease: Secondary | ICD-10-CM | POA: Diagnosis not present

## 2015-06-05 DIAGNOSIS — N186 End stage renal disease: Secondary | ICD-10-CM | POA: Diagnosis not present

## 2015-06-07 DIAGNOSIS — N186 End stage renal disease: Secondary | ICD-10-CM | POA: Diagnosis not present

## 2015-06-08 DIAGNOSIS — N186 End stage renal disease: Secondary | ICD-10-CM | POA: Diagnosis not present

## 2015-06-11 DIAGNOSIS — I10 Essential (primary) hypertension: Secondary | ICD-10-CM | POA: Diagnosis not present

## 2015-06-12 DIAGNOSIS — I10 Essential (primary) hypertension: Secondary | ICD-10-CM | POA: Diagnosis not present

## 2015-06-12 DIAGNOSIS — N186 End stage renal disease: Secondary | ICD-10-CM | POA: Diagnosis not present

## 2015-06-13 DIAGNOSIS — I10 Essential (primary) hypertension: Secondary | ICD-10-CM | POA: Diagnosis not present

## 2015-06-14 DIAGNOSIS — N186 End stage renal disease: Secondary | ICD-10-CM | POA: Diagnosis not present

## 2015-06-14 DIAGNOSIS — I10 Essential (primary) hypertension: Secondary | ICD-10-CM | POA: Diagnosis not present

## 2015-06-15 DIAGNOSIS — I10 Essential (primary) hypertension: Secondary | ICD-10-CM | POA: Diagnosis not present

## 2015-06-16 DIAGNOSIS — N186 End stage renal disease: Secondary | ICD-10-CM | POA: Diagnosis not present

## 2015-06-18 DIAGNOSIS — I10 Essential (primary) hypertension: Secondary | ICD-10-CM | POA: Diagnosis not present

## 2015-06-19 DIAGNOSIS — N186 End stage renal disease: Secondary | ICD-10-CM | POA: Diagnosis not present

## 2015-06-19 DIAGNOSIS — I10 Essential (primary) hypertension: Secondary | ICD-10-CM | POA: Diagnosis not present

## 2015-06-20 DIAGNOSIS — N186 End stage renal disease: Secondary | ICD-10-CM | POA: Diagnosis not present

## 2015-06-20 DIAGNOSIS — E1129 Type 2 diabetes mellitus with other diabetic kidney complication: Secondary | ICD-10-CM | POA: Diagnosis not present

## 2015-06-20 DIAGNOSIS — I10 Essential (primary) hypertension: Secondary | ICD-10-CM | POA: Diagnosis not present

## 2015-06-20 DIAGNOSIS — Z992 Dependence on renal dialysis: Secondary | ICD-10-CM | POA: Diagnosis not present

## 2015-06-21 DIAGNOSIS — N186 End stage renal disease: Secondary | ICD-10-CM | POA: Diagnosis not present

## 2015-06-21 DIAGNOSIS — I10 Essential (primary) hypertension: Secondary | ICD-10-CM | POA: Diagnosis not present

## 2015-06-22 DIAGNOSIS — I10 Essential (primary) hypertension: Secondary | ICD-10-CM | POA: Diagnosis not present

## 2015-06-23 DIAGNOSIS — N186 End stage renal disease: Secondary | ICD-10-CM | POA: Diagnosis not present

## 2015-06-25 DIAGNOSIS — I10 Essential (primary) hypertension: Secondary | ICD-10-CM | POA: Diagnosis not present

## 2015-06-26 DIAGNOSIS — I10 Essential (primary) hypertension: Secondary | ICD-10-CM | POA: Diagnosis not present

## 2015-06-26 DIAGNOSIS — N186 End stage renal disease: Secondary | ICD-10-CM | POA: Diagnosis not present

## 2015-06-27 DIAGNOSIS — I10 Essential (primary) hypertension: Secondary | ICD-10-CM | POA: Diagnosis not present

## 2015-06-28 DIAGNOSIS — I10 Essential (primary) hypertension: Secondary | ICD-10-CM | POA: Diagnosis not present

## 2015-06-28 DIAGNOSIS — N186 End stage renal disease: Secondary | ICD-10-CM | POA: Diagnosis not present

## 2015-06-29 DIAGNOSIS — I10 Essential (primary) hypertension: Secondary | ICD-10-CM | POA: Diagnosis not present

## 2015-06-30 DIAGNOSIS — N186 End stage renal disease: Secondary | ICD-10-CM | POA: Diagnosis not present

## 2015-07-02 DIAGNOSIS — I10 Essential (primary) hypertension: Secondary | ICD-10-CM | POA: Diagnosis not present

## 2015-07-03 DIAGNOSIS — N186 End stage renal disease: Secondary | ICD-10-CM | POA: Diagnosis not present

## 2015-07-03 DIAGNOSIS — I10 Essential (primary) hypertension: Secondary | ICD-10-CM | POA: Diagnosis not present

## 2015-07-04 DIAGNOSIS — I10 Essential (primary) hypertension: Secondary | ICD-10-CM | POA: Diagnosis not present

## 2015-07-05 DIAGNOSIS — I10 Essential (primary) hypertension: Secondary | ICD-10-CM | POA: Diagnosis not present

## 2015-07-05 DIAGNOSIS — N186 End stage renal disease: Secondary | ICD-10-CM | POA: Diagnosis not present

## 2015-07-06 DIAGNOSIS — I10 Essential (primary) hypertension: Secondary | ICD-10-CM | POA: Diagnosis not present

## 2015-07-07 DIAGNOSIS — N186 End stage renal disease: Secondary | ICD-10-CM | POA: Diagnosis not present

## 2015-07-09 DIAGNOSIS — I10 Essential (primary) hypertension: Secondary | ICD-10-CM | POA: Diagnosis not present

## 2015-07-10 DIAGNOSIS — I10 Essential (primary) hypertension: Secondary | ICD-10-CM | POA: Diagnosis not present

## 2015-07-10 DIAGNOSIS — N186 End stage renal disease: Secondary | ICD-10-CM | POA: Diagnosis not present

## 2015-07-11 DIAGNOSIS — I10 Essential (primary) hypertension: Secondary | ICD-10-CM | POA: Diagnosis not present

## 2015-07-12 DIAGNOSIS — I10 Essential (primary) hypertension: Secondary | ICD-10-CM | POA: Diagnosis not present

## 2015-07-12 DIAGNOSIS — N186 End stage renal disease: Secondary | ICD-10-CM | POA: Diagnosis not present

## 2015-07-13 DIAGNOSIS — I10 Essential (primary) hypertension: Secondary | ICD-10-CM | POA: Diagnosis not present

## 2015-07-14 DIAGNOSIS — N186 End stage renal disease: Secondary | ICD-10-CM | POA: Diagnosis not present

## 2015-07-16 DIAGNOSIS — I10 Essential (primary) hypertension: Secondary | ICD-10-CM | POA: Diagnosis not present

## 2015-07-17 DIAGNOSIS — N186 End stage renal disease: Secondary | ICD-10-CM | POA: Diagnosis not present

## 2015-07-17 DIAGNOSIS — I10 Essential (primary) hypertension: Secondary | ICD-10-CM | POA: Diagnosis not present

## 2015-07-18 DIAGNOSIS — I10 Essential (primary) hypertension: Secondary | ICD-10-CM | POA: Diagnosis not present

## 2015-07-19 DIAGNOSIS — I10 Essential (primary) hypertension: Secondary | ICD-10-CM | POA: Diagnosis not present

## 2015-07-19 DIAGNOSIS — N186 End stage renal disease: Secondary | ICD-10-CM | POA: Diagnosis not present

## 2015-07-20 DIAGNOSIS — I10 Essential (primary) hypertension: Secondary | ICD-10-CM | POA: Diagnosis not present

## 2015-07-20 DIAGNOSIS — E1129 Type 2 diabetes mellitus with other diabetic kidney complication: Secondary | ICD-10-CM | POA: Diagnosis not present

## 2015-07-20 DIAGNOSIS — N186 End stage renal disease: Secondary | ICD-10-CM | POA: Diagnosis not present

## 2015-07-20 DIAGNOSIS — Z992 Dependence on renal dialysis: Secondary | ICD-10-CM | POA: Diagnosis not present

## 2015-07-21 DIAGNOSIS — N186 End stage renal disease: Secondary | ICD-10-CM | POA: Diagnosis not present

## 2015-07-23 DIAGNOSIS — I10 Essential (primary) hypertension: Secondary | ICD-10-CM | POA: Diagnosis not present

## 2015-07-24 DIAGNOSIS — N186 End stage renal disease: Secondary | ICD-10-CM | POA: Diagnosis not present

## 2015-07-24 DIAGNOSIS — I10 Essential (primary) hypertension: Secondary | ICD-10-CM | POA: Diagnosis not present

## 2015-07-25 DIAGNOSIS — I10 Essential (primary) hypertension: Secondary | ICD-10-CM | POA: Diagnosis not present

## 2015-07-26 DIAGNOSIS — N186 End stage renal disease: Secondary | ICD-10-CM | POA: Diagnosis not present

## 2015-07-26 DIAGNOSIS — I10 Essential (primary) hypertension: Secondary | ICD-10-CM | POA: Diagnosis not present

## 2015-07-27 DIAGNOSIS — I10 Essential (primary) hypertension: Secondary | ICD-10-CM | POA: Diagnosis not present

## 2015-07-28 ENCOUNTER — Emergency Department (HOSPITAL_COMMUNITY): Payer: Commercial Managed Care - HMO

## 2015-07-28 ENCOUNTER — Emergency Department (HOSPITAL_COMMUNITY)
Admission: EM | Admit: 2015-07-28 | Discharge: 2015-07-28 | Disposition: A | Discharge status: Home / Self Care | Payer: Commercial Managed Care - HMO | Attending: Emergency Medicine | Admitting: Emergency Medicine

## 2015-07-28 ENCOUNTER — Encounter (HOSPITAL_COMMUNITY): Payer: Self-pay

## 2015-07-28 DIAGNOSIS — I252 Old myocardial infarction: Secondary | ICD-10-CM | POA: Insufficient documentation

## 2015-07-28 DIAGNOSIS — Z79899 Other long term (current) drug therapy: Secondary | ICD-10-CM | POA: Insufficient documentation

## 2015-07-28 DIAGNOSIS — I509 Heart failure, unspecified: Secondary | ICD-10-CM | POA: Insufficient documentation

## 2015-07-28 DIAGNOSIS — Y712 Prosthetic and other implants, materials and accessory cardiovascular devices associated with adverse incidents: Secondary | ICD-10-CM | POA: Insufficient documentation

## 2015-07-28 DIAGNOSIS — T82838A Hemorrhage of vascular prosthetic devices, implants and grafts, initial encounter: Secondary | ICD-10-CM | POA: Diagnosis not present

## 2015-07-28 DIAGNOSIS — E119 Type 2 diabetes mellitus without complications: Secondary | ICD-10-CM | POA: Insufficient documentation

## 2015-07-28 DIAGNOSIS — E1122 Type 2 diabetes mellitus with diabetic chronic kidney disease: Secondary | ICD-10-CM | POA: Insufficient documentation

## 2015-07-28 DIAGNOSIS — Z992 Dependence on renal dialysis: Secondary | ICD-10-CM | POA: Diagnosis not present

## 2015-07-28 DIAGNOSIS — Z9689 Presence of other specified functional implants: Secondary | ICD-10-CM | POA: Insufficient documentation

## 2015-07-28 DIAGNOSIS — Z96652 Presence of left artificial knee joint: Secondary | ICD-10-CM | POA: Insufficient documentation

## 2015-07-28 DIAGNOSIS — R0600 Dyspnea, unspecified: Secondary | ICD-10-CM | POA: Diagnosis not present

## 2015-07-28 DIAGNOSIS — Z87891 Personal history of nicotine dependence: Secondary | ICD-10-CM | POA: Diagnosis not present

## 2015-07-28 DIAGNOSIS — N186 End stage renal disease: Secondary | ICD-10-CM | POA: Insufficient documentation

## 2015-07-28 DIAGNOSIS — I2510 Atherosclerotic heart disease of native coronary artery without angina pectoris: Secondary | ICD-10-CM | POA: Insufficient documentation

## 2015-07-28 DIAGNOSIS — Z7982 Long term (current) use of aspirin: Secondary | ICD-10-CM | POA: Diagnosis not present

## 2015-07-28 DIAGNOSIS — Z794 Long term (current) use of insulin: Secondary | ICD-10-CM | POA: Diagnosis not present

## 2015-07-28 DIAGNOSIS — I132 Hypertensive heart and chronic kidney disease with heart failure and with stage 5 chronic kidney disease, or end stage renal disease: Secondary | ICD-10-CM | POA: Insufficient documentation

## 2015-07-28 DIAGNOSIS — I120 Hypertensive chronic kidney disease with stage 5 chronic kidney disease or end stage renal disease: Secondary | ICD-10-CM | POA: Diagnosis not present

## 2015-07-28 DIAGNOSIS — R0602 Shortness of breath: Secondary | ICD-10-CM | POA: Diagnosis not present

## 2015-07-28 LAB — BASIC METABOLIC PANEL
ANION GAP: 11 (ref 5–15)
BUN: 36 mg/dL — ABNORMAL HIGH (ref 6–20)
CALCIUM: 8.7 mg/dL — AB (ref 8.9–10.3)
CHLORIDE: 101 mmol/L (ref 101–111)
CO2: 26 mmol/L (ref 22–32)
CREATININE: 7.04 mg/dL — AB (ref 0.44–1.00)
GFR calc non Af Amer: 5 mL/min — ABNORMAL LOW (ref >60)
GFR, EST AFRICAN AMERICAN: 6 mL/min — AB (ref >60)
Glucose, Bld: 162 mg/dL — ABNORMAL HIGH (ref 65–99)
Potassium: 4.1 mmol/L (ref 3.5–5.1)
SODIUM: 138 mmol/L (ref 135–145)

## 2015-07-28 LAB — CBC
HCT: 37 % (ref 36.0–46.0)
Hemoglobin: 11.4 g/dL — ABNORMAL LOW (ref 12.0–15.0)
MCH: 30.2 pg (ref 26.0–34.0)
MCHC: 30.8 g/dL (ref 30.0–36.0)
MCV: 97.9 fL (ref 78.0–100.0)
PLATELETS: 215 10*3/uL (ref 150–400)
RBC: 3.78 MIL/uL — AB (ref 3.87–5.11)
RDW: 15.8 % — ABNORMAL HIGH (ref 11.5–15.5)
WBC: 9.4 10*3/uL (ref 4.0–10.5)

## 2015-07-28 LAB — APTT: APTT: 159 s — AB (ref 24–37)

## 2015-07-28 LAB — PROTIME-INR
INR: 1.1 (ref 0.00–1.49)
PROTHROMBIN TIME: 14.4 s (ref 11.6–15.2)

## 2015-07-28 MED ORDER — SODIUM CHLORIDE 0.9 % IV SOLN: 100.0000 mL | INTRAVENOUS | Status: DC | PRN

## 2015-07-28 MED ORDER — PENTAFLUOROPROP-TETRAFLUOROETH EX AERO: 1.0000 "application " | INHALATION_SPRAY | CUTANEOUS | Status: DC | PRN

## 2015-07-28 MED ORDER — HEPARIN SODIUM (PORCINE) 1000 UNIT/ML DIALYSIS: 1000.0000 [IU] | INTRAMUSCULAR | Status: DC | PRN

## 2015-07-28 MED ORDER — HEPARIN SODIUM (PORCINE) 1000 UNIT/ML DIALYSIS: 20.0000 [IU]/kg | INTRAMUSCULAR | Status: DC | PRN

## 2015-07-28 MED ORDER — LIDOCAINE-PRILOCAINE 2.5-2.5 % EX CREA: 1.0000 "application " | TOPICAL_CREAM | CUTANEOUS | Status: DC | PRN

## 2015-07-28 MED ORDER — SODIUM CHLORIDE 0.9 % IV SOLN
INTRAVENOUS | Status: DC
  Administered 2015-07-28: 10:00:00 via INTRAVENOUS

## 2015-07-28 MED ORDER — MIDODRINE HCL 5 MG PO TABS
10.0000 mg | ORAL_TABLET | Freq: Once | ORAL | Status: AC
  Administered 2015-07-28: 10 mg via ORAL
  Filled 2015-07-28: qty 2

## 2015-07-28 MED ORDER — LIDOCAINE HCL (PF) 1 % IJ SOLN: 5.0000 mL | INTRAMUSCULAR | Status: DC | PRN

## 2015-07-28 MED ORDER — ALTEPLASE 2 MG IJ SOLR: 2.0000 mg | Freq: Once | INTRAMUSCULAR | Status: DC | PRN

## 2015-07-28 NOTE — ED Notes (Addendum)
Patient sent from dialysis after fistula bleeding during treatment. Did not receive her treatment today, no bleeding on arrival. Was sent her for potassium and fistula check

## 2015-07-28 NOTE — ED Notes (Signed)
Patient transported to dialysis

## 2015-07-28 NOTE — Progress Notes (Signed)
HD treatment completed. Needles pulled and sites held until hemosatsis achieved. Removed PIV from R hand without difficulty. VSS and pt denies pain, SOB, nausea, or dizziness. Assisted patient to dress and helped into wheelchair. Escorted patient to discharge area and helped into car with niece. Pt stable, denies needs or questions. Confirmed patient will return to outpatient dialysis center on Tuesday per normal schedule.

## 2015-07-28 NOTE — Progress Notes (Addendum)
Pt with ESRD on TTS HD at [facility]  Had cannulations issues with significant bleeding around arterial button hole needle.  Came to the ED for evaluation K was 4.1 (on 1 K bath) .  BPs on the low side. Not getting to EDW - CXR no frank edema. BPs 80s - 90s - goal set for 2 L. Both buttonholes cannulated without any bleeding around needles. Plan d/c home post HD.  Re-evaluate Tuesday at dialysis.  Discussed with patient and dialysis RN.  Kelly Splinter MD [employer] pager [phone]    cell [phone] 07/28/2015, 4:34 PM

## 2015-07-28 NOTE — Procedures (Signed)
  I was present at this dialysis session, have reviewed the session itself and made  appropriate changes Kelly Splinter MD [place] pager [phone]    cell [phone] 07/28/2015, 4:34 PM

## 2015-07-28 NOTE — ED Provider Notes (Signed)
CSN: [id number]     Arrival date & time 07/28/15  0907 History   First MD Initiated Contact with Patient 07/28/15 [phone]     Chief Complaint  Patient presents with  . Vascular Access Problem     (Consider location/radiation/quality/duration/timing/severity/associated sxs/prior Treatment) The history is provided by the patient.  Patient with hx ESRD on HD, Tues/Thurs/Sat, c/o bleeding from/around dialysis access site left upper arm today, such that they had to stop dialysis very early.  Patient denies any other recent bleeding or bruising problem.  Patients family member indicates they saturated several pads before bleeding stopped. BP low on arrival to ED, patient indicating 'my bp is always low', and states she does not feel weak or faint.  Has felt mildly sob. Denies cough or uri c/o. No fever or chills. No chest pain or discomfort. Had normal dialysis 2 days ago.       Past Medical History  Diagnosis Date  . Abdominal abscess ([place]) 12-17-10    abdominal abscesses x2 ? one at this time  . Staph aureus infection November 2012   . Swelling of both ankles 12-17-10  . Anemia   . Coronary artery disease     Dr Kadakia-cardiologist 2-3 x year  . CHF (congestive heart failure) ([place])   . Dysrhythmia     irregular, "skips beats"  . Hypertension     sees Dr. Willey Blade  . Neuromuscular disorder ([place])     diabetic neuropathy  . Blind right eye     fell and crushed socket and eye fell out, was replaced at [facility]  . Heart murmur   . Myocardial infarction ([place]) 1970's or 1980's  . Exertional shortness of breath   . Inhalation injury     "worked at [employer]; can't inhale polyurethane or paint, etc" (08/23/2012)  . OSA on CPAP   . Type II diabetes mellitus ([place])     "over 10 years now" (08/23/2012)  . History of blood transfusion     "lots before starting dialysis" (08/23/2012)  . H/O hiatal hernia   . Headache(784.0)     "weekly sometimes; since I fell and hit my head in 07/2011  08/23/2012)  . Eye drainage     "lots; since fall 07/2011" (08/23/2012)  . Arthritis     "all over" (08/23/2012)  . Chronic lower back pain   . Gout   . ESRD (end stage renal disease) on dialysis ([place])     "started 12/2011; [facility]; TTS" (08/23/2012   Past Surgical History  Procedure Laterality Date  . Ventral hernia repair  2012-2013    component separation, repair with biologic; "had 3 surgeries to fix it" (08/23/2012)  . Cataract extraction w/ intraocular lens implant Bilateral 1990's  . Laparotomy  12/18/2010    Procedure: EXPLORATORY LAPAROTOMY;  Surgeon: Rolm Bookbinder, MD;  Location: [facility];  Service: General;  Laterality: N/A;  Abdominal Seroma Evacuation  . Back surgery    . Lumbar disc surgery  1970'-80's    X 3  . Wound debridement  03/20/2011    Procedure: DEBRIDEMENT ABDOMINAL WOUND;  Surgeon: Rolm Bookbinder, MD;  Location: [facility];  Service: General;  Laterality: N/A;  debridement abdominal wall, placement of wound vac  . Av fistula placement  09/30/2011    Procedure: ARTERIOVENOUS (AV) FISTULA CREATION;  Surgeon: Conrad Amherstdale, MD;  Location: [facility];  Service: Vascular;  Laterality: Left;  BRACHIAL-CEPHALIC  . Bascilic vein transposition Left 11/10/2011    Left arm   .  Insertion of dialysis catheter  01/05/2012    Procedure: INSERTION OF DIALYSIS CATHETER;  Surgeon: Conrad Granville, MD;  Location: [facility];  Service: Vascular;  Laterality: N/A;  Right Internal Jugular Placement  . Bascilic vein transposition  01/28/2012    Procedure: BASCILIC VEIN TRANSPOSITION;  Surgeon: Conrad Haskell, MD;  Location: [facility];  Service: Vascular;  Laterality: Left;  Second Stage   . Appendectomy    . Hernia repair    . Joint replacement    . Total knee arthroplasty Left 1980's  . Vaginal hysterectomy  1970's  . Dilation and curettage of uterus  1960's  . Intraocular prostheses insertion Right 2013    "fell; knocked my eye outl" (08/23/2012)  . Eye surgery    . Cardiac catheterization  1980's  .  Left heart catheterization with coronary angiogram N/A 08/25/2012    Procedure: LEFT HEART CATHETERIZATION WITH CORONARY ANGIOGRAM;  Surgeon: Birdie Riddle, MD;  Location: [facility] C[facility];  Service: Cardiovascular;  Laterality: N/A;   Family History  Problem Relation Age of Onset  . Hypertension Mother   . Cancer Brother     spine  . Cancer Sister     brain  . Hypertension Maternal Grandmother   . Anesthesia problems Neg Hx   . Hypertension Father    Social History  Substance Use Topics  . Smoking status: Former Smoker -- 0.25 packs/day for 16 years    Types: Cigarettes    Quit date: 12/16/1980  . Smokeless tobacco: Never Used  . Alcohol Use: No   OB History    No data available     Review of Systems  Constitutional: Negative for fever and chills.  HENT: Negative for nosebleeds.   Eyes: Negative for visual disturbance.  Respiratory: Negative for cough.   Cardiovascular: Negative for chest pain and leg swelling.  Gastrointestinal: Negative for vomiting, abdominal pain and blood in stool.  Genitourinary: Negative for hematuria, flank pain and vaginal bleeding.  Musculoskeletal: Negative for back pain and neck pain.  Skin: Negative for rash.  Neurological: Negative for dizziness, syncope, light-headedness and headaches.  Hematological: Does not bruise/bleed easily.  Psychiatric/Behavioral: Negative for confusion.      Allergies  Review of patient's allergies indicates no known allergies.  Home Medications   Prior to Admission medications   Medication Sig Start Date End Date Taking? Authorizing Provider  albuterol (PROVENTIL HFA;VENTOLIN HFA) 108 (90 BASE) MCG/ACT inhaler Inhale 2 puffs into the lungs every 4 (four) hours as needed for wheezing or shortness of breath.   Yes Historical Provider, MD  aspirin EC 81 MG tablet Take 81 mg by mouth daily.     Yes Historical Provider, MD  bisacodyl (DULCOLAX) 5 MG EC tablet Take 10 mg by mouth daily as needed. For constipation    Yes Historical Provider, MD  Calcium Carbonate-Vitamin D (CALCIUM 600 + D PO) Take 1 tablet by mouth daily.    Yes Historical Provider, MD  HYDROcodone-acetaminophen (NORCO/VICODIN) 5-325 MG per tablet Take 1-2 tablets by mouth every 6 (six) hours as needed for pain.   Yes Historical Provider, MD  indomethacin (INDOCIN) 50 MG capsule Take 50 mg by mouth 2 (two) times daily as needed (for gout pain).   Yes Historical Provider, MD  insulin glargine (LANTUS) 100 UNIT/ML injection Inject 5-10 Units into the skin at bedtime. Patient uses sliding scale.   Yes Historical Provider, MD  isosorbide mononitrate (IMDUR) 60 MG 24 hr tablet Take 0.5 tablets (30 mg total)  by mouth daily. 12/11/14  Yes Barton Dubois, MD  metoprolol succinate (TOPROL-XL) 25 MG 24 hr tablet Take 1 tablet (25 mg total) by mouth daily. Patient taking differently: Take 50 mg by mouth daily.  12/07/14  Yes Barton Dubois, MD  midodrine (PROAMATINE) 10 MG tablet Take 10 mg by mouth daily. 07/10/15  Yes Historical Provider, MD  Multiple Vitamin (MULITIVITAMIN WITH MINERALS) TABS Take 1 tablet by mouth daily.   Yes Historical Provider, MD  nitroGLYCERIN (NITROSTAT) 0.4 MG SL tablet Place 0.4 mg under the tongue every 5 (five) minutes as needed for chest pain.   Yes Historical Provider, MD  potassium chloride SA (K-DUR,KLOR-CON) 20 MEQ tablet Take 20 mEq by mouth daily. 06/08/15  Yes Historical Provider, MD  rosuvastatin (CRESTOR) 10 MG tablet Take 10 mg by mouth daily. 07/02/15  Yes Historical Provider, MD  SENSIPAR 90 MG tablet Take 90 mg by mouth daily. 11/30/14  Yes Historical Provider, MD  sevelamer carbonate (RENVELA) 800 MG tablet Take 2,400 mg by mouth 3 (three) times daily with meals.   Yes Historical Provider, MD   BP 78/48 mmHg  Pulse 71  Temp(Src) 97.7 F (36.5 C) (Oral)  Resp 18  Wt 132.5 kg  SpO2 100% Physical Exam  Constitutional: She appears well-developed and well-nourished. No distress.  HENT:  Mouth/Throat: Oropharynx  is clear and moist.  Eyes: Conjunctivae are normal. No scleral icterus.  Neck: Neck supple. No tracheal deviation present.  Cardiovascular: Normal rate, regular rhythm, normal heart sounds and intact distal pulses.   Pulmonary/Chest: Effort normal and breath sounds normal. No respiratory distress.  Abdominal: Soft. Normal appearance. She exhibits no distension. There is no tenderness.  Musculoskeletal: She exhibits no edema.  bandaids over dialysis access site LUE, no active bleeding. No puls mass. +thrill. Distal pulses palp.   Neurological: She is alert.  Skin: Skin is warm and dry. No rash noted. She is not diaphoretic.  Psychiatric: She has a normal mood and affect.  Nursing note and vitals reviewed.   ED Course  Procedures (including critical care time) Labs Review   Results for orders placed or performed during the hospital encounter of [id number]  Basic metabolic panel  Result Value Ref Range   Sodium 138 135 - 145 mmol/L   Potassium 4.1 3.5 - 5.1 mmol/L   Chloride 101 101 - 111 mmol/L   CO2 26 22 - 32 mmol/L   Glucose, Bld 162 (H) 65 - 99 mg/dL   BUN 36 (H) 6 - 20 mg/dL   Creatinine, Ser 7.04 (H) 0.44 - 1.00 mg/dL   Calcium 8.7 (L) 8.9 - 10.3 mg/dL   GFR calc non Af Amer 5 (L) >60 mL/min   GFR calc Af Amer 6 (L) >60 mL/min   Anion gap 11 5 - 15  CBC  Result Value Ref Range   WBC 9.4 4.0 - 10.5 K/uL   RBC 3.78 (L) 3.87 - 5.11 MIL/uL   Hemoglobin 11.4 (L) 12.0 - 15.0 g/dL   HCT 37.0 36.0 - 46.0 %   MCV 97.9 78.0 - 100.0 fL   MCH 30.2 26.0 - 34.0 pg   MCHC 30.8 30.0 - 36.0 g/dL   RDW 15.8 (H) 11.5 - 15.5 %   Platelets 215 150 - 400 K/uL  Protime-INR  Result Value Ref Range   Prothrombin Time 14.4 11.6 - 15.2 seconds   INR 1.10 0.00 - 1.49  APTT  Result Value Ref Range   aPTT 159 (H) 24 - 37 seconds  Dg Chest Port 1 View  07/28/2015  CLINICAL DATA:  Hypertension and chronic renal failure. Shortness of breath EXAM: PORTABLE CHEST 1 VIEW COMPARISON:  May 07, 2012 FINDINGS: There is chronic peribronchial thickening. There is no edema or consolidation. There is cardiomegaly with pulmonary vascularity within normal limits. No adenopathy. There is atherosclerotic calcification in the aortic arch. No adenopathy. No bone lesions. IMPRESSION: Chronic bronchitis. Mild cardiomegaly. No frank edema or consolidation. Aortic atherosclerosis. Electronically Signed   By: Lowella Grip III M.D.   On: 07/28/2015 09:43       I have personally reviewed and evaluated these images and lab results as part of my medical decision-making.    MDM   Iv ns. Labs.  Pt indicates 'my bp is always low', and denies feeling faint/dizzy. Will recheck bp/vitals. Note made that pts is on midodrine, but also bblocker?  Reviewed nursing notes and prior charts for additional history.   Recheck pt, no recurrent bleeding.    Due to low bps, dyspnea/vascular congestion on cxr/missed hd - nephrology consulted re dialysis - they indicate patient known to them, they will see/ take for dialysis.        Lajean Saver, MD 07/28/15 1115

## 2015-07-28 NOTE — ED Notes (Signed)
Venda Rodes (niece)  - call [phone] with updates in plan and to notify when patient is ready to be picked up

## 2015-07-30 DIAGNOSIS — I10 Essential (primary) hypertension: Secondary | ICD-10-CM | POA: Diagnosis not present

## 2015-07-31 DIAGNOSIS — I10 Essential (primary) hypertension: Secondary | ICD-10-CM | POA: Diagnosis not present

## 2015-07-31 DIAGNOSIS — N186 End stage renal disease: Secondary | ICD-10-CM | POA: Diagnosis not present

## 2015-08-01 DIAGNOSIS — I10 Essential (primary) hypertension: Secondary | ICD-10-CM | POA: Diagnosis not present

## 2015-08-02 DIAGNOSIS — N186 End stage renal disease: Secondary | ICD-10-CM | POA: Diagnosis not present

## 2015-08-02 DIAGNOSIS — I10 Essential (primary) hypertension: Secondary | ICD-10-CM | POA: Diagnosis not present

## 2015-08-03 DIAGNOSIS — I10 Essential (primary) hypertension: Secondary | ICD-10-CM | POA: Diagnosis not present

## 2015-08-04 DIAGNOSIS — N186 End stage renal disease: Secondary | ICD-10-CM | POA: Diagnosis not present

## 2015-08-06 DIAGNOSIS — I10 Essential (primary) hypertension: Secondary | ICD-10-CM | POA: Diagnosis not present

## 2015-08-07 DIAGNOSIS — N186 End stage renal disease: Secondary | ICD-10-CM | POA: Diagnosis not present

## 2015-08-07 DIAGNOSIS — I10 Essential (primary) hypertension: Secondary | ICD-10-CM | POA: Diagnosis not present

## 2015-08-08 DIAGNOSIS — I10 Essential (primary) hypertension: Secondary | ICD-10-CM | POA: Diagnosis not present

## 2015-08-09 DIAGNOSIS — I10 Essential (primary) hypertension: Secondary | ICD-10-CM | POA: Diagnosis not present

## 2015-08-09 DIAGNOSIS — N186 End stage renal disease: Secondary | ICD-10-CM | POA: Diagnosis not present

## 2015-08-10 DIAGNOSIS — I10 Essential (primary) hypertension: Secondary | ICD-10-CM | POA: Diagnosis not present

## 2015-08-11 DIAGNOSIS — N186 End stage renal disease: Secondary | ICD-10-CM | POA: Diagnosis not present

## 2015-08-13 DIAGNOSIS — I10 Essential (primary) hypertension: Secondary | ICD-10-CM | POA: Diagnosis not present

## 2015-08-14 DIAGNOSIS — I10 Essential (primary) hypertension: Secondary | ICD-10-CM | POA: Diagnosis not present

## 2015-08-14 DIAGNOSIS — N186 End stage renal disease: Secondary | ICD-10-CM | POA: Diagnosis not present

## 2015-08-15 DIAGNOSIS — I10 Essential (primary) hypertension: Secondary | ICD-10-CM | POA: Diagnosis not present

## 2015-08-16 DIAGNOSIS — N186 End stage renal disease: Secondary | ICD-10-CM | POA: Diagnosis not present

## 2015-08-16 DIAGNOSIS — I10 Essential (primary) hypertension: Secondary | ICD-10-CM | POA: Diagnosis not present

## 2015-08-17 DIAGNOSIS — I10 Essential (primary) hypertension: Secondary | ICD-10-CM | POA: Diagnosis not present

## 2015-08-18 DIAGNOSIS — N186 End stage renal disease: Secondary | ICD-10-CM | POA: Diagnosis not present

## 2015-08-20 DIAGNOSIS — N186 End stage renal disease: Secondary | ICD-10-CM | POA: Diagnosis not present

## 2015-08-20 DIAGNOSIS — E1129 Type 2 diabetes mellitus with other diabetic kidney complication: Secondary | ICD-10-CM | POA: Diagnosis not present

## 2015-08-20 DIAGNOSIS — Z992 Dependence on renal dialysis: Secondary | ICD-10-CM | POA: Diagnosis not present

## 2015-08-20 DIAGNOSIS — I10 Essential (primary) hypertension: Secondary | ICD-10-CM | POA: Diagnosis not present

## 2015-08-21 DIAGNOSIS — I10 Essential (primary) hypertension: Secondary | ICD-10-CM | POA: Diagnosis not present

## 2015-08-21 DIAGNOSIS — N186 End stage renal disease: Secondary | ICD-10-CM | POA: Diagnosis not present

## 2015-08-22 DIAGNOSIS — I10 Essential (primary) hypertension: Secondary | ICD-10-CM | POA: Diagnosis not present

## 2015-08-23 DIAGNOSIS — I10 Essential (primary) hypertension: Secondary | ICD-10-CM | POA: Diagnosis not present

## 2015-08-23 DIAGNOSIS — N186 End stage renal disease: Secondary | ICD-10-CM | POA: Diagnosis not present

## 2015-08-24 DIAGNOSIS — I10 Essential (primary) hypertension: Secondary | ICD-10-CM | POA: Diagnosis not present

## 2015-08-25 DIAGNOSIS — N186 End stage renal disease: Secondary | ICD-10-CM | POA: Diagnosis not present

## 2015-08-27 DIAGNOSIS — I10 Essential (primary) hypertension: Secondary | ICD-10-CM | POA: Diagnosis not present

## 2015-08-28 DIAGNOSIS — I10 Essential (primary) hypertension: Secondary | ICD-10-CM | POA: Diagnosis not present

## 2015-08-28 DIAGNOSIS — N186 End stage renal disease: Secondary | ICD-10-CM | POA: Diagnosis not present

## 2015-08-29 DIAGNOSIS — I10 Essential (primary) hypertension: Secondary | ICD-10-CM | POA: Diagnosis not present

## 2015-08-30 DIAGNOSIS — N186 End stage renal disease: Secondary | ICD-10-CM | POA: Diagnosis not present

## 2015-08-30 DIAGNOSIS — I10 Essential (primary) hypertension: Secondary | ICD-10-CM | POA: Diagnosis not present

## 2015-08-31 DIAGNOSIS — I10 Essential (primary) hypertension: Secondary | ICD-10-CM | POA: Diagnosis not present

## 2015-09-01 DIAGNOSIS — N186 End stage renal disease: Secondary | ICD-10-CM | POA: Diagnosis not present

## 2015-09-03 DIAGNOSIS — I10 Essential (primary) hypertension: Secondary | ICD-10-CM | POA: Diagnosis not present

## 2015-09-04 DIAGNOSIS — I10 Essential (primary) hypertension: Secondary | ICD-10-CM | POA: Diagnosis not present

## 2015-09-04 DIAGNOSIS — N186 End stage renal disease: Secondary | ICD-10-CM | POA: Diagnosis not present

## 2015-09-05 DIAGNOSIS — I10 Essential (primary) hypertension: Secondary | ICD-10-CM | POA: Diagnosis not present

## 2015-09-06 DIAGNOSIS — N186 End stage renal disease: Secondary | ICD-10-CM | POA: Diagnosis not present

## 2015-09-06 DIAGNOSIS — I10 Essential (primary) hypertension: Secondary | ICD-10-CM | POA: Diagnosis not present

## 2015-09-07 DIAGNOSIS — I10 Essential (primary) hypertension: Secondary | ICD-10-CM | POA: Diagnosis not present

## 2015-09-08 DIAGNOSIS — N186 End stage renal disease: Secondary | ICD-10-CM | POA: Diagnosis not present

## 2015-09-10 DIAGNOSIS — I10 Essential (primary) hypertension: Secondary | ICD-10-CM | POA: Diagnosis not present

## 2015-09-11 DIAGNOSIS — N186 End stage renal disease: Secondary | ICD-10-CM | POA: Diagnosis not present

## 2015-09-11 DIAGNOSIS — I10 Essential (primary) hypertension: Secondary | ICD-10-CM | POA: Diagnosis not present

## 2015-09-12 DIAGNOSIS — I10 Essential (primary) hypertension: Secondary | ICD-10-CM | POA: Diagnosis not present

## 2015-09-13 DIAGNOSIS — I10 Essential (primary) hypertension: Secondary | ICD-10-CM | POA: Diagnosis not present

## 2015-09-13 DIAGNOSIS — N186 End stage renal disease: Secondary | ICD-10-CM | POA: Diagnosis not present

## 2015-09-14 DIAGNOSIS — N186 End stage renal disease: Secondary | ICD-10-CM | POA: Diagnosis not present

## 2015-09-14 DIAGNOSIS — I10 Essential (primary) hypertension: Secondary | ICD-10-CM | POA: Diagnosis not present

## 2015-09-17 DIAGNOSIS — I10 Essential (primary) hypertension: Secondary | ICD-10-CM | POA: Diagnosis not present

## 2015-09-18 ENCOUNTER — Other Ambulatory Visit: Payer: Self-pay

## 2015-09-18 DIAGNOSIS — N186 End stage renal disease: Secondary | ICD-10-CM | POA: Diagnosis not present

## 2015-09-18 DIAGNOSIS — I10 Essential (primary) hypertension: Secondary | ICD-10-CM | POA: Diagnosis not present

## 2015-09-18 NOTE — Patient Outreach (Addendum)
[facility]) Care Management  09/18/2015  TENLY TROLINGER Feb 27, 1945 [id number]   Referral Date: 09-17-2015 Referral Source: Self-Referral Referral Reason: MD Instructions Outreach Attempt: First outreach successful  Social: Patient lives alone but has an aide that comes to assist.  Conditions: Patient admits to ESRD, HTN, CHF, and DM.  Patient admits to being partially blind. Patient goes to dialysis three times a week at 5:30 am.  Patient states she is independent with bathing and dressing.    Medications: Patient not sure how many medications she takes but her aide helps her with her medication. She reports that her church member comes and fills her medication box for her every 2 weeks.  No problems with affording medication and no questions about her medications.  Services: Patient agrees to Engineer, maintenance outreach for disease management of diabetes.   Plan:  RN Health Coach will outreach to patient in the month of September for initial assessment.    Jone Baseman, RN, MSN [place] [phone]

## 2015-09-19 DIAGNOSIS — I10 Essential (primary) hypertension: Secondary | ICD-10-CM | POA: Diagnosis not present

## 2015-09-20 DIAGNOSIS — Z992 Dependence on renal dialysis: Secondary | ICD-10-CM | POA: Diagnosis not present

## 2015-09-20 DIAGNOSIS — E1129 Type 2 diabetes mellitus with other diabetic kidney complication: Secondary | ICD-10-CM | POA: Diagnosis not present

## 2015-09-20 DIAGNOSIS — N186 End stage renal disease: Secondary | ICD-10-CM | POA: Diagnosis not present

## 2015-09-21 DIAGNOSIS — I10 Essential (primary) hypertension: Secondary | ICD-10-CM | POA: Diagnosis not present

## 2015-09-22 DIAGNOSIS — N186 End stage renal disease: Secondary | ICD-10-CM | POA: Diagnosis not present

## 2015-09-24 DIAGNOSIS — I10 Essential (primary) hypertension: Secondary | ICD-10-CM | POA: Diagnosis not present

## 2015-09-25 DIAGNOSIS — I10 Essential (primary) hypertension: Secondary | ICD-10-CM | POA: Diagnosis not present

## 2015-09-25 DIAGNOSIS — N186 End stage renal disease: Secondary | ICD-10-CM | POA: Diagnosis not present

## 2015-09-26 DIAGNOSIS — I10 Essential (primary) hypertension: Secondary | ICD-10-CM | POA: Diagnosis not present

## 2015-09-27 ENCOUNTER — Other Ambulatory Visit: Payer: Self-pay

## 2015-09-27 DIAGNOSIS — N186 End stage renal disease: Secondary | ICD-10-CM | POA: Diagnosis not present

## 2015-09-27 DIAGNOSIS — I10 Essential (primary) hypertension: Secondary | ICD-10-CM | POA: Diagnosis not present

## 2015-09-27 NOTE — Patient Outreach (Addendum)
[facility]) Care Management  [place]  09/27/2015   [name] [birth year] [id number]  Subjective: Telephone call to patient for initial assessment. Patient reports she is doing good today and states she went to dialysis today.  Patient reports she goes to [facility] on [address].  Patient reports she stays active and goes to the [facility] for silver sneakers on the days she does not have dialysis.  Patient has aide 5 times a week.  Her aide takes her to her appointments, shopping and writes her bills out.  Patient reports she sees her primary care doctor on Monday.  Patient checks her sugar twice a day.  Last check was 135. Discussed with patient A1c and goal of keeping less than 6.0. She verbalized understanding.   Last A1c was 5.9. Patient aide reviewed medication list with me as patient is partially blind in right eye. Patient has no questions or concerns about her medications.  Patient states her church member fills medication box for her regularly.   No concerns.  Objective:   Encounter Medications:  Outpatient Encounter Prescriptions as of 09/27/2015  Medication Sig Note  . aspirin EC 81 MG tablet Take 81 mg by mouth daily.     . bisacodyl (DULCOLAX) 5 MG EC tablet Take 10 mg by mouth daily as needed. For constipation   . Calcium Carbonate-Vitamin D (CALCIUM 600 + D PO) Take 1 tablet by mouth daily.    Marland Kitchen HYDROcodone-acetaminophen (NORCO/VICODIN) 5-325 MG per tablet Take 1-2 tablets by mouth every 6 (six) hours as needed for pain.   . indomethacin (INDOCIN) 50 MG capsule Take 50 mg by mouth 2 (two) times daily as needed (for gout pain).   . insulin glargine (LANTUS) 100 UNIT/ML injection Inject 5-10 Units into the skin at bedtime. Patient uses sliding scale.   . metoprolol succinate (TOPROL-XL) 25 MG 24 hr tablet Take 1 tablet (25 mg total) by mouth daily. 09/27/2015: Patient reports taking 0.5 tab daily  . midodrine (PROAMATINE) 10 MG tablet Take 10 mg by  mouth daily. 07/28/2015: Received from: External Pharmacy Received Sig:   . Multiple Vitamin (MULITIVITAMIN WITH MINERALS) TABS Take 1 tablet by mouth daily.   . nitroGLYCERIN (NITROSTAT) 0.4 MG SL tablet Place 0.4 mg under the tongue every 5 (five) minutes as needed for chest pain.   . rosuvastatin (CRESTOR) 10 MG tablet Take 10 mg by mouth daily. 07/28/2015: Received from: External Pharmacy Received Sig: TK 1 T PO D  . SENSIPAR 90 MG tablet Take 90 mg by mouth daily. 09/27/2015: Patient reports taking 60 mg BID  . sevelamer carbonate (RENVELA) 800 MG tablet Take 2,400 mg by mouth 3 (three) times daily with meals. 09/27/2015: Patient reports taking 800 mg daily.   Marland Kitchen albuterol (PROVENTIL HFA;VENTOLIN HFA) 108 (90 BASE) MCG/ACT inhaler Inhale 2 puffs into the lungs every 4 (four) hours as needed for wheezing or shortness of breath.   . isosorbide mononitrate (IMDUR) 60 MG 24 hr tablet Take 0.5 tablets (30 mg total) by mouth daily. (Patient not taking: Reported on 09/27/2015)   . potassium chloride SA (K-DUR,KLOR-CON) 20 MEQ tablet Take 20 mEq by mouth daily. 07/28/2015: Received from: External Pharmacy Received Sig: TK 1 T PO  QD   No facility-administered encounter medications on file as of 09/27/2015.     Functional Status:  In your present state of health, do you have any difficulty performing the following activities: 09/27/2015 12/05/2014  Hearing? N N  Vision? Darreld Mclean  N  Difficulty concentrating or making decisions? N N  Walking or climbing stairs? Y Y  Dressing or bathing? N Y  Doing errands, shopping? Tempie Donning  Preparing Food and eating ? N -  Using the Toilet? N -  In the past six months, have you accidently leaked urine? N -  Do you have problems with loss of bowel control? N -  Managing your Medications? (No Data) -  Managing your Finances? Y -  Housekeeping or managing your Housekeeping? Y -  Some recent data might be hidden    Fall/Depression Screening: PHQ 2/9 Scores 09/27/2015 09/18/2015  PHQ - 2  Score 0 0    Assessment: Patient will benefit from health coach outreach for education and support in continuing to manage her diabetes.    Plan:  [facility] CM Care Plan Problem One   Flowsheet Row Most Recent Value  Care Plan Problem One  Diabetes Knowledge Deficit  Role Documenting the Problem One  [place] for Problem One  Active  [name] Long Term Goal (31-90 days)  Patient will keep A1c less than 6.0 within 90 days.  [name] Long Term Goal Start Date  09/27/15  Interventions for Problem One [place] discussed with patient A1c goal and maintaining her current diet.   [name] CM Short Term Goal #1 (0-30 days)  Patient will verbalize care for diabetes such as diet and signs and symptoms of hypo/hyperglycemia within 30 days.  [name] CM Short Term Goal #1 Start Date  09/27/15  Interventions for Short Term Goal #1  RN Health coach reviewed with patient signs and symptoms of hypoglycemia and hyperglycemia.   [name] CM Short Term Goal #2 (0-30 days)  Patient will be able to report maintaining low carbohydrate diet within 30 days.   [name] CM Short Term Goal #2 Start Date  09/27/15  Interventions for Short Term Goal #2  [place] discussed avoidance of high carbohydrate foods such as potatoes, rice, and pastas.      RN Health Coach will provide ongoing education for patient on diabetes through discussion phone calls due to patient vision issues.   RN Health Coach will send welcome packet with consent to patient. RN Health Coach will send initial barriers letter, assessment, and care plan to primary care physician. RN Health Coach will contact patient in the month of October and patient agrees to next contact.   Jone Baseman, RN, MSN [place] [phone]

## 2015-09-28 DIAGNOSIS — I10 Essential (primary) hypertension: Secondary | ICD-10-CM | POA: Diagnosis not present

## 2015-09-29 DIAGNOSIS — N186 End stage renal disease: Secondary | ICD-10-CM | POA: Diagnosis not present

## 2015-10-01 DIAGNOSIS — M859 Disorder of bone density and structure, unspecified: Secondary | ICD-10-CM | POA: Diagnosis not present

## 2015-10-01 DIAGNOSIS — E119 Type 2 diabetes mellitus without complications: Secondary | ICD-10-CM | POA: Diagnosis not present

## 2015-10-01 DIAGNOSIS — I10 Essential (primary) hypertension: Secondary | ICD-10-CM | POA: Diagnosis not present

## 2015-10-01 DIAGNOSIS — E784 Other hyperlipidemia: Secondary | ICD-10-CM | POA: Diagnosis not present

## 2015-10-01 DIAGNOSIS — M810 Age-related osteoporosis without current pathological fracture: Secondary | ICD-10-CM | POA: Diagnosis not present

## 2015-10-01 DIAGNOSIS — N186 End stage renal disease: Secondary | ICD-10-CM | POA: Diagnosis not present

## 2015-10-01 DIAGNOSIS — Z6841 Body Mass Index (BMI) 40.0 and over, adult: Secondary | ICD-10-CM | POA: Diagnosis not present

## 2015-10-01 DIAGNOSIS — Z23 Encounter for immunization: Secondary | ICD-10-CM | POA: Diagnosis not present

## 2015-10-02 DIAGNOSIS — N186 End stage renal disease: Secondary | ICD-10-CM | POA: Diagnosis not present

## 2015-10-02 DIAGNOSIS — I10 Essential (primary) hypertension: Secondary | ICD-10-CM | POA: Diagnosis not present

## 2015-10-03 DIAGNOSIS — I10 Essential (primary) hypertension: Secondary | ICD-10-CM | POA: Diagnosis not present

## 2015-10-04 DIAGNOSIS — N186 End stage renal disease: Secondary | ICD-10-CM | POA: Diagnosis not present

## 2015-10-04 DIAGNOSIS — I10 Essential (primary) hypertension: Secondary | ICD-10-CM | POA: Diagnosis not present

## 2015-10-05 DIAGNOSIS — I10 Essential (primary) hypertension: Secondary | ICD-10-CM | POA: Diagnosis not present

## 2015-10-06 DIAGNOSIS — N186 End stage renal disease: Secondary | ICD-10-CM | POA: Diagnosis not present

## 2015-10-08 DIAGNOSIS — I10 Essential (primary) hypertension: Secondary | ICD-10-CM | POA: Diagnosis not present

## 2015-10-09 DIAGNOSIS — N186 End stage renal disease: Secondary | ICD-10-CM | POA: Diagnosis not present

## 2015-10-09 DIAGNOSIS — I10 Essential (primary) hypertension: Secondary | ICD-10-CM | POA: Diagnosis not present

## 2015-10-10 DIAGNOSIS — I10 Essential (primary) hypertension: Secondary | ICD-10-CM | POA: Diagnosis not present

## 2015-10-11 DIAGNOSIS — N186 End stage renal disease: Secondary | ICD-10-CM | POA: Diagnosis not present

## 2015-10-11 DIAGNOSIS — I10 Essential (primary) hypertension: Secondary | ICD-10-CM | POA: Diagnosis not present

## 2015-10-12 DIAGNOSIS — I10 Essential (primary) hypertension: Secondary | ICD-10-CM | POA: Diagnosis not present

## 2015-10-13 DIAGNOSIS — N186 End stage renal disease: Secondary | ICD-10-CM | POA: Diagnosis not present

## 2015-10-16 DIAGNOSIS — N186 End stage renal disease: Secondary | ICD-10-CM | POA: Diagnosis not present

## 2015-10-17 DIAGNOSIS — N186 End stage renal disease: Secondary | ICD-10-CM | POA: Diagnosis not present

## 2015-10-19 DIAGNOSIS — I871 Compression of vein: Secondary | ICD-10-CM | POA: Diagnosis not present

## 2015-10-19 DIAGNOSIS — T82868D Thrombosis of vascular prosthetic devices, implants and grafts, subsequent encounter: Secondary | ICD-10-CM | POA: Diagnosis not present

## 2015-10-19 DIAGNOSIS — N186 End stage renal disease: Secondary | ICD-10-CM | POA: Diagnosis not present

## 2015-10-19 DIAGNOSIS — Z992 Dependence on renal dialysis: Secondary | ICD-10-CM | POA: Diagnosis not present

## 2015-10-20 DIAGNOSIS — E1129 Type 2 diabetes mellitus with other diabetic kidney complication: Secondary | ICD-10-CM | POA: Diagnosis not present

## 2015-10-20 DIAGNOSIS — N186 End stage renal disease: Secondary | ICD-10-CM | POA: Diagnosis not present

## 2015-10-20 DIAGNOSIS — Z992 Dependence on renal dialysis: Secondary | ICD-10-CM | POA: Diagnosis not present

## 2015-10-23 ENCOUNTER — Other Ambulatory Visit: Payer: Self-pay

## 2015-10-23 DIAGNOSIS — N186 End stage renal disease: Secondary | ICD-10-CM | POA: Diagnosis not present

## 2015-10-23 NOTE — Patient Outreach (Signed)
[facility]) Care Management  10/23/2015  RENLEY GUTMAN January 08, 1946 [id number]   Telephone call to patient for monthly call.  States number is changed or disconnected.  Plan: RN Health Coach will attempt patient again in the month of October.  Jone Baseman, RN, MSN [place] [phone]

## 2015-10-25 DIAGNOSIS — N186 End stage renal disease: Secondary | ICD-10-CM | POA: Diagnosis not present

## 2015-10-26 DIAGNOSIS — N186 End stage renal disease: Secondary | ICD-10-CM | POA: Diagnosis not present

## 2015-10-30 DIAGNOSIS — I10 Essential (primary) hypertension: Secondary | ICD-10-CM | POA: Diagnosis not present

## 2015-10-30 DIAGNOSIS — N186 End stage renal disease: Secondary | ICD-10-CM | POA: Diagnosis not present

## 2015-10-31 DIAGNOSIS — I10 Essential (primary) hypertension: Secondary | ICD-10-CM | POA: Diagnosis not present

## 2015-11-01 DIAGNOSIS — N186 End stage renal disease: Secondary | ICD-10-CM | POA: Diagnosis not present

## 2015-11-01 DIAGNOSIS — I10 Essential (primary) hypertension: Secondary | ICD-10-CM | POA: Diagnosis not present

## 2015-11-02 DIAGNOSIS — I10 Essential (primary) hypertension: Secondary | ICD-10-CM | POA: Diagnosis not present

## 2015-11-03 DIAGNOSIS — I10 Essential (primary) hypertension: Secondary | ICD-10-CM | POA: Diagnosis not present

## 2015-11-03 DIAGNOSIS — N186 End stage renal disease: Secondary | ICD-10-CM | POA: Diagnosis not present

## 2015-11-04 DIAGNOSIS — I10 Essential (primary) hypertension: Secondary | ICD-10-CM | POA: Diagnosis not present

## 2015-11-05 DIAGNOSIS — I10 Essential (primary) hypertension: Secondary | ICD-10-CM | POA: Diagnosis not present

## 2015-11-06 ENCOUNTER — Ambulatory Visit: Payer: Self-pay

## 2015-11-06 DIAGNOSIS — N186 End stage renal disease: Secondary | ICD-10-CM | POA: Diagnosis not present

## 2015-11-06 DIAGNOSIS — I10 Essential (primary) hypertension: Secondary | ICD-10-CM | POA: Diagnosis not present

## 2015-11-07 DIAGNOSIS — Z6841 Body Mass Index (BMI) 40.0 and over, adult: Secondary | ICD-10-CM | POA: Diagnosis not present

## 2015-11-07 DIAGNOSIS — M810 Age-related osteoporosis without current pathological fracture: Secondary | ICD-10-CM | POA: Diagnosis not present

## 2015-11-07 DIAGNOSIS — I10 Essential (primary) hypertension: Secondary | ICD-10-CM | POA: Diagnosis not present

## 2015-11-08 ENCOUNTER — Other Ambulatory Visit: Payer: Self-pay

## 2015-11-08 DIAGNOSIS — I10 Essential (primary) hypertension: Secondary | ICD-10-CM | POA: Diagnosis not present

## 2015-11-08 DIAGNOSIS — N186 End stage renal disease: Secondary | ICD-10-CM | POA: Diagnosis not present

## 2015-11-08 NOTE — Patient Outreach (Signed)
[name]) Care Management  [place]  11/08/2015   [name] [birth year] [id number]  Subjective: Telephone call to patient for monthly call. Patient reports she is doing about the same. Patient reports no problems with dialysis. Patient reports her blood sugars ranging from 110-125. Patient reports she continues to watch her diet in order to control sugars. No concerns.   Objective:   Encounter Medications:  Outpatient Encounter Prescriptions as of 11/08/2015  Medication Sig Note  . albuterol (PROVENTIL HFA;VENTOLIN HFA) 108 (90 BASE) MCG/ACT inhaler Inhale 2 puffs into the lungs every 4 (four) hours as needed for wheezing or shortness of breath.   Marland Kitchen aspirin EC 81 MG tablet Take 81 mg by mouth daily.     . bisacodyl (DULCOLAX) 5 MG EC tablet Take 10 mg by mouth daily as needed. For constipation   . Calcium Carbonate-Vitamin D (CALCIUM 600 + D PO) Take 1 tablet by mouth daily.    Marland Kitchen HYDROcodone-acetaminophen (NORCO/VICODIN) 5-325 MG per tablet Take 1-2 tablets by mouth every 6 (six) hours as needed for pain.   . indomethacin (INDOCIN) 50 MG capsule Take 50 mg by mouth 2 (two) times daily as needed (for gout pain).   . insulin glargine (LANTUS) 100 UNIT/ML injection Inject 5-10 Units into the skin at bedtime. Patient uses sliding scale.   . metoprolol succinate (TOPROL-XL) 25 MG 24 hr tablet Take 1 tablet (25 mg total) by mouth daily. 09/27/2015: Patient reports taking 0.5 tab daily  . midodrine (PROAMATINE) 10 MG tablet Take 10 mg by mouth daily. 07/28/2015: Received from: External Pharmacy Received Sig:   . Multiple Vitamin (MULITIVITAMIN WITH MINERALS) TABS Take 1 tablet by mouth daily.   . nitroGLYCERIN (NITROSTAT) 0.4 MG SL tablet Place 0.4 mg under the tongue every 5 (five) minutes as needed for chest pain.   . potassium chloride SA (K-DUR,KLOR-CON) 20 MEQ tablet Take 20 mEq by mouth daily. 07/28/2015: Received from: External Pharmacy Received Sig: TK 1 T PO   QD  . rosuvastatin (CRESTOR) 10 MG tablet Take 10 mg by mouth daily. 07/28/2015: Received from: External Pharmacy Received Sig: TK 1 T PO D  . SENSIPAR 90 MG tablet Take 90 mg by mouth daily. 09/27/2015: Patient reports taking 60 mg BID  . sevelamer carbonate (RENVELA) 800 MG tablet Take 2,400 mg by mouth 3 (three) times daily with meals. 09/27/2015: Patient reports taking 800 mg daily.   . isosorbide mononitrate (IMDUR) 60 MG 24 hr tablet Take 0.5 tablets (30 mg total) by mouth daily. (Patient not taking: Reported on 11/08/2015)    No facility-administered encounter medications on file as of 11/08/2015.     Functional Status:  In your present state of health, do you have any difficulty performing the following activities: 09/27/2015 12/05/2014  Hearing? N N  Vision? Y N  Difficulty concentrating or making decisions? N N  Walking or climbing stairs? Y Y  Dressing or bathing? N Y  Doing errands, shopping? Tempie Donning  Preparing Food and eating ? N -  Using the Toilet? N -  In the past six months, have you accidently leaked urine? N -  Do you have problems with loss of bowel control? N -  Managing your Medications? (No Data) -  Managing your Finances? Y -  Housekeeping or managing your Housekeeping? Y -  Some recent data might be hidden    Fall/Depression Screening: PHQ 2/9 Scores 11/08/2015 09/27/2015 09/18/2015  PHQ - 2 Score 0 0 0  Assessment: Patient continues to benefit from health coach outreach for disease management and support.    Plan:  [name] CM Care Plan Problem One   Flowsheet Row Most Recent Value  Care Plan Problem One  Diabetes Knowledge Deficit  Role Documenting the Problem One  [place] for Problem One  Active  [name] Long Term Goal (31-90 days)  Patient will keep A1c less than 6.0 within 90 days.  [name] Long Term Goal Start Date  11/08/15  Interventions for Problem One [place] reviewed with patient A1c goal and maintaining her current diet.    [name] CM Short Term Goal #1 (0-30 days)  Patient will verbalize care for diabetes such as diet and signs and symptoms of hypo/hyperglycemia within 30 days.  [name] CM Short Term Goal #1 Start Date  11/08/15  Interventions for Short Term Goal #1  RN Health Coach reinforced with patient signs and symptoms of hypoglycemia and hyperglycemia.   [name] CM Short Term Goal #2 (0-30 days)  Patient will be able to report maintaining low carbohydrate diet within 30 days.   [name] CM Short Term Goal #2 Start Date  09/27/15  [facility] CM Short Term Goal #2 Met Date  11/08/15  Interventions for Short Term Goal #2  Goal met. Patient continues to limit carbohydrates.      RN Health Coach will contact patient in the month of November and patient agrees to next outreach.  Jone Baseman, RN, MSN [place] [phone]

## 2015-11-09 DIAGNOSIS — I10 Essential (primary) hypertension: Secondary | ICD-10-CM | POA: Diagnosis not present

## 2015-11-10 DIAGNOSIS — I10 Essential (primary) hypertension: Secondary | ICD-10-CM | POA: Diagnosis not present

## 2015-11-10 DIAGNOSIS — N186 End stage renal disease: Secondary | ICD-10-CM | POA: Diagnosis not present

## 2015-11-11 DIAGNOSIS — I10 Essential (primary) hypertension: Secondary | ICD-10-CM | POA: Diagnosis not present

## 2015-11-12 DIAGNOSIS — I10 Essential (primary) hypertension: Secondary | ICD-10-CM | POA: Diagnosis not present

## 2015-11-13 DIAGNOSIS — H40022 Open angle with borderline findings, high risk, left eye: Secondary | ICD-10-CM | POA: Diagnosis not present

## 2015-11-13 DIAGNOSIS — I10 Essential (primary) hypertension: Secondary | ICD-10-CM | POA: Diagnosis not present

## 2015-11-13 DIAGNOSIS — N186 End stage renal disease: Secondary | ICD-10-CM | POA: Diagnosis not present

## 2015-11-14 ENCOUNTER — Ambulatory Visit: Payer: Self-pay

## 2015-11-14 DIAGNOSIS — I10 Essential (primary) hypertension: Secondary | ICD-10-CM | POA: Diagnosis not present

## 2015-11-15 ENCOUNTER — Other Ambulatory Visit (HOSPITAL_COMMUNITY): Payer: Self-pay | Admitting: *Deleted

## 2015-11-15 DIAGNOSIS — N186 End stage renal disease: Secondary | ICD-10-CM | POA: Diagnosis not present

## 2015-11-15 DIAGNOSIS — I10 Essential (primary) hypertension: Secondary | ICD-10-CM | POA: Diagnosis not present

## 2015-11-16 ENCOUNTER — Ambulatory Visit (HOSPITAL_COMMUNITY)
Admission: RE | Admit: 2015-11-16 | Discharge: 2015-11-16 | Disposition: A | Discharge status: Home / Self Care | Payer: Commercial Managed Care - HMO | Source: Ambulatory Visit | Attending: Internal Medicine | Admitting: Internal Medicine

## 2015-11-16 DIAGNOSIS — N184 Chronic kidney disease, stage 4 (severe): Secondary | ICD-10-CM

## 2015-11-16 DIAGNOSIS — M810 Age-related osteoporosis without current pathological fracture: Secondary | ICD-10-CM | POA: Diagnosis not present

## 2015-11-16 DIAGNOSIS — I10 Essential (primary) hypertension: Secondary | ICD-10-CM | POA: Diagnosis not present

## 2015-11-16 MED ORDER — EPOETIN ALFA 10000 UNIT/ML IJ SOLN: 30000.0000 [IU] | INTRAMUSCULAR | Status: DC

## 2015-11-16 MED ORDER — DENOSUMAB 60 MG/ML ~~LOC~~ SOLN
60.0000 mg | Freq: Once | SUBCUTANEOUS | Status: AC
  Administered 2015-11-16: 60 mg via SUBCUTANEOUS
  Filled 2015-11-16: qty 1

## 2015-11-16 NOTE — Discharge Instructions (Signed)
Denosumab injection  What is this medicine?  DENOSUMAB (den oh sue mab) slows bone breakdown. Prolia is used to treat osteoporosis in women after menopause and in men. Xgeva is used to prevent bone fractures and other bone problems caused by cancer bone metastases. Xgeva is also used to treat giant cell tumor of the bone.  This medicine may be used for other purposes; ask your health care provider or pharmacist if you have questions.  What should I tell my health care provider before I take this medicine?  They need to know if you have any of these conditions:  -dental disease  -eczema  -infection or history of infections  -kidney disease or on dialysis  -low blood calcium or vitamin D  -malabsorption syndrome  -scheduled to have surgery or tooth extraction  -taking medicine that contains denosumab  -thyroid or parathyroid disease  -an unusual reaction to denosumab, other medicines, foods, dyes, or preservatives  -pregnant or trying to get pregnant  -breast-feeding  How should I use this medicine?  This medicine is for injection under the skin. It is given by a health care professional in a hospital or clinic setting.  If you are getting Prolia, a special MedGuide will be given to you by the pharmacist with each prescription and refill. Be sure to read this information carefully each time.  For Prolia, talk to your pediatrician regarding the use of this medicine in children. Special care may be needed. For Xgeva, talk to your pediatrician regarding the use of this medicine in children. While this drug may be prescribed for children as young as 13 years for selected conditions, precautions do apply.  Overdosage: If you think you have taken too much of this medicine contact a poison control center or emergency room at once.  NOTE: This medicine is only for you. Do not share this medicine with others.  What if I miss a dose?  It is important not to miss your dose. Call your doctor or health care professional if you are  unable to keep an appointment.  What may interact with this medicine?  Do not take this medicine with any of the following medications:  -other medicines containing denosumab  This medicine may also interact with the following medications:  -medicines that suppress the immune system  -medicines that treat cancer  -steroid medicines like prednisone or cortisone  This list may not describe all possible interactions. Give your health care provider a list of all the medicines, herbs, non-prescription drugs, or dietary supplements you use. Also tell them if you smoke, drink alcohol, or use illegal drugs. Some items may interact with your medicine.  What should I watch for while using this medicine?  Visit your doctor or health care professional for regular checks on your progress. Your doctor or health care professional may order blood tests and other tests to see how you are doing.  Call your doctor or health care professional if you get a cold or other infection while receiving this medicine. Do not treat yourself. This medicine may decrease your body's ability to fight infection.  You should make sure you get enough calcium and vitamin D while you are taking this medicine, unless your doctor tells you not to. Discuss the foods you eat and the vitamins you take with your health care professional.  See your dentist regularly. Brush and floss your teeth as directed. Before you have any dental work done, tell your dentist you are receiving this medicine.  Do   not become pregnant while taking this medicine or for 5 months after stopping it. Women should inform their doctor if they wish to become pregnant or think they might be pregnant. There is a potential for serious side effects to an unborn child. Talk to your health care professional or pharmacist for more information.  What side effects may I notice from receiving this medicine?  Side effects that you should report to your doctor or health care professional as soon as  possible:  -allergic reactions like skin rash, itching or hives, swelling of the face, lips, or tongue  -breathing problems  -chest pain  -fast, irregular heartbeat  -feeling faint or lightheaded, falls  -fever, chills, or any other sign of infection  -muscle spasms, tightening, or twitches  -numbness or tingling  -skin blisters or bumps, or is dry, peels, or red  -slow healing or unexplained pain in the mouth or jaw  -unusual bleeding or bruising  Side effects that usually do not require medical attention (Report these to your doctor or health care professional if they continue or are bothersome.):  -muscle pain  -stomach upset, gas  This list may not describe all possible side effects. Call your doctor for medical advice about side effects. You may report side effects to FDA at 1-800-FDA-1088.  Where should I keep my medicine?  This medicine is only given in a clinic, doctor's office, or other health care setting and will not be stored at home.  NOTE: This sheet is a summary. It may not cover all possible information. If you have questions about this medicine, talk to your doctor, pharmacist, or health care provider.      2016, Elsevier/Gold Standard. (2011-07-07 12:37:47)

## 2015-11-17 DIAGNOSIS — N186 End stage renal disease: Secondary | ICD-10-CM | POA: Diagnosis not present

## 2015-11-17 DIAGNOSIS — I10 Essential (primary) hypertension: Secondary | ICD-10-CM | POA: Diagnosis not present

## 2015-11-18 DIAGNOSIS — I10 Essential (primary) hypertension: Secondary | ICD-10-CM | POA: Diagnosis not present

## 2015-11-19 DIAGNOSIS — I10 Essential (primary) hypertension: Secondary | ICD-10-CM | POA: Diagnosis not present

## 2015-11-20 DIAGNOSIS — I10 Essential (primary) hypertension: Secondary | ICD-10-CM | POA: Diagnosis not present

## 2015-11-20 DIAGNOSIS — E1129 Type 2 diabetes mellitus with other diabetic kidney complication: Secondary | ICD-10-CM | POA: Diagnosis not present

## 2015-11-20 DIAGNOSIS — Z992 Dependence on renal dialysis: Secondary | ICD-10-CM | POA: Diagnosis not present

## 2015-11-20 DIAGNOSIS — N186 End stage renal disease: Secondary | ICD-10-CM | POA: Diagnosis not present

## 2015-11-21 DIAGNOSIS — I10 Essential (primary) hypertension: Secondary | ICD-10-CM | POA: Diagnosis not present

## 2015-11-21 DIAGNOSIS — E119 Type 2 diabetes mellitus without complications: Secondary | ICD-10-CM | POA: Diagnosis not present

## 2015-11-21 DIAGNOSIS — H40022 Open angle with borderline findings, high risk, left eye: Secondary | ICD-10-CM | POA: Diagnosis not present

## 2015-11-21 DIAGNOSIS — Z970 Presence of artificial eye: Secondary | ICD-10-CM | POA: Diagnosis not present

## 2015-11-21 DIAGNOSIS — Z961 Presence of intraocular lens: Secondary | ICD-10-CM | POA: Diagnosis not present

## 2015-11-22 DIAGNOSIS — I10 Essential (primary) hypertension: Secondary | ICD-10-CM | POA: Diagnosis not present

## 2015-11-22 DIAGNOSIS — N186 End stage renal disease: Secondary | ICD-10-CM | POA: Diagnosis not present

## 2015-11-23 DIAGNOSIS — I10 Essential (primary) hypertension: Secondary | ICD-10-CM | POA: Diagnosis not present

## 2015-11-24 DIAGNOSIS — I10 Essential (primary) hypertension: Secondary | ICD-10-CM | POA: Diagnosis not present

## 2015-11-24 DIAGNOSIS — N186 End stage renal disease: Secondary | ICD-10-CM | POA: Diagnosis not present

## 2015-11-25 DIAGNOSIS — I10 Essential (primary) hypertension: Secondary | ICD-10-CM | POA: Diagnosis not present

## 2015-11-26 ENCOUNTER — Other Ambulatory Visit: Payer: Self-pay | Admitting: Internal Medicine

## 2015-11-26 DIAGNOSIS — Z1231 Encounter for screening mammogram for malignant neoplasm of breast: Secondary | ICD-10-CM

## 2015-11-26 DIAGNOSIS — I10 Essential (primary) hypertension: Secondary | ICD-10-CM | POA: Diagnosis not present

## 2015-11-27 DIAGNOSIS — N186 End stage renal disease: Secondary | ICD-10-CM | POA: Diagnosis not present

## 2015-11-27 DIAGNOSIS — I10 Essential (primary) hypertension: Secondary | ICD-10-CM | POA: Diagnosis not present

## 2015-11-28 DIAGNOSIS — I10 Essential (primary) hypertension: Secondary | ICD-10-CM | POA: Diagnosis not present

## 2015-11-29 ENCOUNTER — Other Ambulatory Visit: Payer: Self-pay

## 2015-11-29 DIAGNOSIS — I10 Essential (primary) hypertension: Secondary | ICD-10-CM | POA: Diagnosis not present

## 2015-11-29 DIAGNOSIS — N186 End stage renal disease: Secondary | ICD-10-CM | POA: Diagnosis not present

## 2015-11-29 NOTE — Patient Outreach (Signed)
[facility]) Care Management  [place]  11/29/2015   [name] 07/07/1945 [id number]  Subjective: Telephone call to patient for monthly call.  Patient reports she is doing ok.  She reports that her dialysis is going good. She states that her sugars are also doing ok and that her sugar this morning was 111. Patient reports a fall about two weeks ago in the transportation [place] for dialysis.  She states that some of the lift equipment was in her way. She states that she hurt her chin and chest but she states they are getting better just some soreness to her chest.  Discussed with patient fall precautions and continuing diet to control her sugars.  She verbalized understanding.    Objective:   Encounter Medications:  Outpatient Encounter Prescriptions as of 11/29/2015  Medication Sig Note  . albuterol (PROVENTIL HFA;VENTOLIN HFA) 108 (90 BASE) MCG/ACT inhaler Inhale 2 puffs into the lungs every 4 (four) hours as needed for wheezing or shortness of breath.   Marland Kitchen aspirin EC 81 MG tablet Take 81 mg by mouth daily.     . bisacodyl (DULCOLAX) 5 MG EC tablet Take 10 mg by mouth daily as needed. For constipation   . Calcium Carbonate-Vitamin D (CALCIUM 600 + D PO) Take 1 tablet by mouth daily.    Marland Kitchen HYDROcodone-acetaminophen (NORCO/VICODIN) 5-325 MG per tablet Take 1-2 tablets by mouth every 6 (six) hours as needed for pain.   . indomethacin (INDOCIN) 50 MG capsule Take 50 mg by mouth 2 (two) times daily as needed (for gout pain).   . insulin glargine (LANTUS) 100 UNIT/ML injection Inject 5-10 Units into the skin at bedtime. Patient uses sliding scale.   . metoprolol succinate (TOPROL-XL) 25 MG 24 hr tablet Take 1 tablet (25 mg total) by mouth daily. 09/27/2015: Patient reports taking 0.5 tab daily  . midodrine (PROAMATINE) 10 MG tablet Take 10 mg by mouth daily. 07/28/2015: Received from: External Pharmacy Received Sig:   . Multiple Vitamin (MULITIVITAMIN WITH MINERALS) TABS Take  1 tablet by mouth daily.   . nitroGLYCERIN (NITROSTAT) 0.4 MG SL tablet Place 0.4 mg under the tongue every 5 (five) minutes as needed for chest pain.   . potassium chloride SA (K-DUR,KLOR-CON) 20 MEQ tablet Take 20 mEq by mouth daily. 07/28/2015: Received from: External Pharmacy Received Sig: TK 1 T PO  QD  . rosuvastatin (CRESTOR) 10 MG tablet Take 10 mg by mouth daily. 07/28/2015: Received from: External Pharmacy Received Sig: TK 1 T PO D  . SENSIPAR 90 MG tablet Take 90 mg by mouth daily. 09/27/2015: Patient reports taking 60 mg BID  . sevelamer carbonate (RENVELA) 800 MG tablet Take 2,400 mg by mouth 3 (three) times daily with meals. 09/27/2015: Patient reports taking 800 mg daily.   . isosorbide mononitrate (IMDUR) 60 MG 24 hr tablet Take 0.5 tablets (30 mg total) by mouth daily. (Patient not taking: Reported on 11/29/2015)    No facility-administered encounter medications on file as of 11/29/2015.     Functional Status:  In your present state of health, do you have any difficulty performing the following activities: 11/16/2015 09/27/2015  Hearing? N N  Vision? N Y  Difficulty concentrating or making decisions? N N  Walking or climbing stairs? N Y  Dressing or bathing? N N  Doing errands, shopping? - Y  Conservation officer, nature and eating ? - N  Using the Toilet? - N  In the past six months, have you accidently leaked  urine? - N  Do you have problems with loss of bowel control? - N  Managing your Medications? - (No Data)  Managing your Finances? - Y  Housekeeping or managing your Housekeeping? - Y  Some recent data might be hidden    Fall/Depression Screening: PHQ 2/9 Scores 11/29/2015 11/08/2015 09/27/2015 09/18/2015  PHQ - 2 Score 0 0 0 0    Assessment: Patient continues to benefit from health coach outreach for disease management and support.    Plan:  [facility] CM Care Plan Problem One   Flowsheet Row Most Recent Value  Care Plan Problem One  Diabetes Knowledge Deficit  Role Documenting the  Problem One  Mariano Colon for Problem One  Active  [name] Long Term Goal (31-90 days)  Patient will keep A1c less than 6.0 within 90 days.  [name] Long Term Goal Start Date  11/29/15 [goal continued]  Interventions for Problem One Long Term Goal  RN Health Coach reinforced with patient A1c goal and maintaining her current diet.   [name] CM Short Term Goal #1 (0-30 days)  Patient will verbalize care for diabetes such as diet and signs and symptoms of hypo/hyperglycemia within 30 days.  [name] CM Short Term Goal #1 Start Date  11/08/15  [facility] CM Short Term Goal #1 Met Date  11/29/15  Interventions for Short Term Goal #1  Patient able to list symptoms of hyop/hyperglycemia.       RN Health Coach will contact patient in the month of December and patient agrees to next outreach.  Jone Baseman, RN, MSN [place] [phone]

## 2015-11-30 DIAGNOSIS — I10 Essential (primary) hypertension: Secondary | ICD-10-CM | POA: Diagnosis not present

## 2015-12-01 DIAGNOSIS — N186 End stage renal disease: Secondary | ICD-10-CM | POA: Diagnosis not present

## 2015-12-03 DIAGNOSIS — I10 Essential (primary) hypertension: Secondary | ICD-10-CM | POA: Diagnosis not present

## 2015-12-04 DIAGNOSIS — N186 End stage renal disease: Secondary | ICD-10-CM | POA: Diagnosis not present

## 2015-12-04 DIAGNOSIS — I10 Essential (primary) hypertension: Secondary | ICD-10-CM | POA: Diagnosis not present

## 2015-12-05 DIAGNOSIS — I10 Essential (primary) hypertension: Secondary | ICD-10-CM | POA: Diagnosis not present

## 2015-12-06 DIAGNOSIS — N186 End stage renal disease: Secondary | ICD-10-CM | POA: Diagnosis not present

## 2015-12-06 DIAGNOSIS — I10 Essential (primary) hypertension: Secondary | ICD-10-CM | POA: Diagnosis not present

## 2015-12-07 DIAGNOSIS — I10 Essential (primary) hypertension: Secondary | ICD-10-CM | POA: Diagnosis not present

## 2015-12-08 DIAGNOSIS — N186 End stage renal disease: Secondary | ICD-10-CM | POA: Diagnosis not present

## 2015-12-10 DIAGNOSIS — N186 End stage renal disease: Secondary | ICD-10-CM | POA: Diagnosis not present

## 2015-12-10 DIAGNOSIS — I10 Essential (primary) hypertension: Secondary | ICD-10-CM | POA: Diagnosis not present

## 2015-12-11 DIAGNOSIS — I10 Essential (primary) hypertension: Secondary | ICD-10-CM | POA: Diagnosis not present

## 2015-12-12 DIAGNOSIS — I10 Essential (primary) hypertension: Secondary | ICD-10-CM | POA: Diagnosis not present

## 2015-12-12 DIAGNOSIS — N186 End stage renal disease: Secondary | ICD-10-CM | POA: Diagnosis not present

## 2015-12-13 DIAGNOSIS — I10 Essential (primary) hypertension: Secondary | ICD-10-CM | POA: Diagnosis not present

## 2015-12-14 DIAGNOSIS — I10 Essential (primary) hypertension: Secondary | ICD-10-CM | POA: Diagnosis not present

## 2015-12-15 DIAGNOSIS — I10 Essential (primary) hypertension: Secondary | ICD-10-CM | POA: Diagnosis not present

## 2015-12-15 DIAGNOSIS — N186 End stage renal disease: Secondary | ICD-10-CM | POA: Diagnosis not present

## 2015-12-16 DIAGNOSIS — I10 Essential (primary) hypertension: Secondary | ICD-10-CM | POA: Diagnosis not present

## 2015-12-17 DIAGNOSIS — S60551A Superficial foreign body of right hand, initial encounter: Secondary | ICD-10-CM | POA: Diagnosis not present

## 2015-12-17 DIAGNOSIS — I10 Essential (primary) hypertension: Secondary | ICD-10-CM | POA: Diagnosis not present

## 2015-12-18 DIAGNOSIS — I10 Essential (primary) hypertension: Secondary | ICD-10-CM | POA: Diagnosis not present

## 2015-12-18 DIAGNOSIS — N186 End stage renal disease: Secondary | ICD-10-CM | POA: Diagnosis not present

## 2015-12-19 DIAGNOSIS — I10 Essential (primary) hypertension: Secondary | ICD-10-CM | POA: Diagnosis not present

## 2015-12-20 DIAGNOSIS — Z992 Dependence on renal dialysis: Secondary | ICD-10-CM | POA: Diagnosis not present

## 2015-12-20 DIAGNOSIS — E1129 Type 2 diabetes mellitus with other diabetic kidney complication: Secondary | ICD-10-CM | POA: Diagnosis not present

## 2015-12-20 DIAGNOSIS — I10 Essential (primary) hypertension: Secondary | ICD-10-CM | POA: Diagnosis not present

## 2015-12-20 DIAGNOSIS — N186 End stage renal disease: Secondary | ICD-10-CM | POA: Diagnosis not present

## 2015-12-21 DIAGNOSIS — I10 Essential (primary) hypertension: Secondary | ICD-10-CM | POA: Diagnosis not present

## 2015-12-22 DIAGNOSIS — N186 End stage renal disease: Secondary | ICD-10-CM | POA: Diagnosis not present

## 2015-12-24 DIAGNOSIS — I10 Essential (primary) hypertension: Secondary | ICD-10-CM | POA: Diagnosis not present

## 2015-12-25 ENCOUNTER — Other Ambulatory Visit: Payer: Self-pay

## 2015-12-25 DIAGNOSIS — I10 Essential (primary) hypertension: Secondary | ICD-10-CM | POA: Diagnosis not present

## 2015-12-25 DIAGNOSIS — N186 End stage renal disease: Secondary | ICD-10-CM | POA: Diagnosis not present

## 2015-12-25 NOTE — Patient Outreach (Signed)
[place] [facility]) Care Management  12/25/2015  [name] July 17, 1945 [id number]   Telephone call to patient for monthly call. No answer.  Unable to leave a message.   Plan: RN Health Coach will attempt patient again in the month of December.  Jone Baseman, RN, MSN [place] [phone]

## 2015-12-26 DIAGNOSIS — I10 Essential (primary) hypertension: Secondary | ICD-10-CM | POA: Diagnosis not present

## 2015-12-27 DIAGNOSIS — I10 Essential (primary) hypertension: Secondary | ICD-10-CM | POA: Diagnosis not present

## 2015-12-27 DIAGNOSIS — N186 End stage renal disease: Secondary | ICD-10-CM | POA: Diagnosis not present

## 2015-12-28 DIAGNOSIS — I10 Essential (primary) hypertension: Secondary | ICD-10-CM | POA: Diagnosis not present

## 2015-12-29 DIAGNOSIS — N186 End stage renal disease: Secondary | ICD-10-CM | POA: Diagnosis not present

## 2015-12-31 ENCOUNTER — Ambulatory Visit: Payer: Commercial Managed Care - HMO

## 2016-01-01 DIAGNOSIS — N186 End stage renal disease: Secondary | ICD-10-CM | POA: Diagnosis not present

## 2016-01-03 DIAGNOSIS — N186 End stage renal disease: Secondary | ICD-10-CM | POA: Diagnosis not present

## 2016-01-05 DIAGNOSIS — N186 End stage renal disease: Secondary | ICD-10-CM | POA: Diagnosis not present

## 2016-01-07 DIAGNOSIS — I10 Essential (primary) hypertension: Secondary | ICD-10-CM | POA: Diagnosis not present

## 2016-01-08 DIAGNOSIS — I10 Essential (primary) hypertension: Secondary | ICD-10-CM | POA: Diagnosis not present

## 2016-01-08 DIAGNOSIS — N186 End stage renal disease: Secondary | ICD-10-CM | POA: Diagnosis not present

## 2016-01-09 ENCOUNTER — Other Ambulatory Visit: Payer: Self-pay

## 2016-01-09 DIAGNOSIS — I10 Essential (primary) hypertension: Secondary | ICD-10-CM | POA: Diagnosis not present

## 2016-01-09 NOTE — Patient Outreach (Signed)
[facility]) Care Management  01/09/2016  [name] October 21, 1945 [id number]   2nd Telephone call to patient for monthly call. Two calls each time were busy.    Plan: RN Health Coach will attempt patient in the month of January.   Jone Baseman, RN, MSN [place] [phone]

## 2016-01-10 DIAGNOSIS — N186 End stage renal disease: Secondary | ICD-10-CM | POA: Diagnosis not present

## 2016-01-10 DIAGNOSIS — I10 Essential (primary) hypertension: Secondary | ICD-10-CM | POA: Diagnosis not present

## 2016-01-11 DIAGNOSIS — I10 Essential (primary) hypertension: Secondary | ICD-10-CM | POA: Diagnosis not present

## 2016-01-12 DIAGNOSIS — N186 End stage renal disease: Secondary | ICD-10-CM | POA: Diagnosis not present

## 2016-01-14 DIAGNOSIS — I10 Essential (primary) hypertension: Secondary | ICD-10-CM | POA: Diagnosis not present

## 2016-01-15 DIAGNOSIS — I10 Essential (primary) hypertension: Secondary | ICD-10-CM | POA: Diagnosis not present

## 2016-01-15 DIAGNOSIS — N186 End stage renal disease: Secondary | ICD-10-CM | POA: Diagnosis not present

## 2016-01-16 DIAGNOSIS — I10 Essential (primary) hypertension: Secondary | ICD-10-CM | POA: Diagnosis not present

## 2016-01-17 DIAGNOSIS — I10 Essential (primary) hypertension: Secondary | ICD-10-CM | POA: Diagnosis not present

## 2016-01-17 DIAGNOSIS — N186 End stage renal disease: Secondary | ICD-10-CM | POA: Diagnosis not present

## 2016-01-18 DIAGNOSIS — I10 Essential (primary) hypertension: Secondary | ICD-10-CM | POA: Diagnosis not present

## 2016-01-19 DIAGNOSIS — N186 End stage renal disease: Secondary | ICD-10-CM | POA: Diagnosis not present

## 2016-01-19 DIAGNOSIS — I10 Essential (primary) hypertension: Secondary | ICD-10-CM | POA: Diagnosis not present

## 2016-01-20 DIAGNOSIS — Z992 Dependence on renal dialysis: Secondary | ICD-10-CM | POA: Diagnosis not present

## 2016-01-20 DIAGNOSIS — E1129 Type 2 diabetes mellitus with other diabetic kidney complication: Secondary | ICD-10-CM | POA: Diagnosis not present

## 2016-01-20 DIAGNOSIS — N186 End stage renal disease: Secondary | ICD-10-CM | POA: Diagnosis not present

## 2016-01-20 DIAGNOSIS — I10 Essential (primary) hypertension: Secondary | ICD-10-CM | POA: Diagnosis not present

## 2016-01-21 DIAGNOSIS — I10 Essential (primary) hypertension: Secondary | ICD-10-CM | POA: Diagnosis not present

## 2016-01-22 DIAGNOSIS — N2581 Secondary hyperparathyroidism of renal origin: Secondary | ICD-10-CM | POA: Diagnosis not present

## 2016-01-22 DIAGNOSIS — I10 Essential (primary) hypertension: Secondary | ICD-10-CM | POA: Diagnosis not present

## 2016-01-22 DIAGNOSIS — N186 End stage renal disease: Secondary | ICD-10-CM | POA: Diagnosis not present

## 2016-01-23 ENCOUNTER — Other Ambulatory Visit: Payer: Self-pay

## 2016-01-23 DIAGNOSIS — I10 Essential (primary) hypertension: Secondary | ICD-10-CM | POA: Diagnosis not present

## 2016-01-23 NOTE — Patient Outreach (Addendum)
[facility]) Care Management  01/23/2016  MAKAYLEN THIEME [birth year] [id number]   Telephone call to patient for monthly outreach.  No answer.  Unable to leave a message. Call placed to [facility].  No answer.  HIPAA compliant voice message left.   Call placed to friend Beatriz Stallion. No answer.  HIPAA compliant voice message left.    Plan: RN Health Coach will send letter to attempt outreach.    Jone Baseman, RN, MSN [place] [phone]

## 2016-01-24 DIAGNOSIS — N186 End stage renal disease: Secondary | ICD-10-CM | POA: Diagnosis not present

## 2016-01-24 DIAGNOSIS — I10 Essential (primary) hypertension: Secondary | ICD-10-CM | POA: Diagnosis not present

## 2016-01-24 DIAGNOSIS — N2581 Secondary hyperparathyroidism of renal origin: Secondary | ICD-10-CM | POA: Diagnosis not present

## 2016-01-25 DIAGNOSIS — E119 Type 2 diabetes mellitus without complications: Secondary | ICD-10-CM | POA: Diagnosis not present

## 2016-01-25 DIAGNOSIS — R0609 Other forms of dyspnea: Secondary | ICD-10-CM | POA: Diagnosis not present

## 2016-01-25 DIAGNOSIS — Z6841 Body Mass Index (BMI) 40.0 and over, adult: Secondary | ICD-10-CM | POA: Diagnosis not present

## 2016-01-25 DIAGNOSIS — E784 Other hyperlipidemia: Secondary | ICD-10-CM | POA: Diagnosis not present

## 2016-01-25 DIAGNOSIS — I509 Heart failure, unspecified: Secondary | ICD-10-CM | POA: Diagnosis not present

## 2016-01-25 DIAGNOSIS — I10 Essential (primary) hypertension: Secondary | ICD-10-CM | POA: Diagnosis not present

## 2016-01-26 DIAGNOSIS — N2581 Secondary hyperparathyroidism of renal origin: Secondary | ICD-10-CM | POA: Diagnosis not present

## 2016-01-26 DIAGNOSIS — I10 Essential (primary) hypertension: Secondary | ICD-10-CM | POA: Diagnosis not present

## 2016-01-26 DIAGNOSIS — N186 End stage renal disease: Secondary | ICD-10-CM | POA: Diagnosis not present

## 2016-01-27 DIAGNOSIS — I10 Essential (primary) hypertension: Secondary | ICD-10-CM | POA: Diagnosis not present

## 2016-01-28 ENCOUNTER — Ambulatory Visit
Admission: RE | Admit: 2016-01-28 | Discharge: 2016-01-28 | Disposition: A | Discharge status: Home / Self Care | Payer: Commercial Managed Care - HMO | Source: Ambulatory Visit | Attending: Internal Medicine | Admitting: Internal Medicine

## 2016-01-28 DIAGNOSIS — Z1231 Encounter for screening mammogram for malignant neoplasm of breast: Secondary | ICD-10-CM

## 2016-01-28 DIAGNOSIS — I10 Essential (primary) hypertension: Secondary | ICD-10-CM | POA: Diagnosis not present

## 2016-01-29 ENCOUNTER — Other Ambulatory Visit: Payer: Self-pay | Admitting: Internal Medicine

## 2016-01-29 DIAGNOSIS — I10 Essential (primary) hypertension: Secondary | ICD-10-CM | POA: Diagnosis not present

## 2016-01-29 DIAGNOSIS — R928 Other abnormal and inconclusive findings on diagnostic imaging of breast: Secondary | ICD-10-CM

## 2016-01-29 DIAGNOSIS — N186 End stage renal disease: Secondary | ICD-10-CM | POA: Diagnosis not present

## 2016-01-29 DIAGNOSIS — N2581 Secondary hyperparathyroidism of renal origin: Secondary | ICD-10-CM | POA: Diagnosis not present

## 2016-01-30 ENCOUNTER — Encounter (HOSPITAL_COMMUNITY): Payer: Self-pay | Admitting: General Practice

## 2016-01-30 ENCOUNTER — Inpatient Hospital Stay (HOSPITAL_COMMUNITY)
Admission: AD | Admit: 2016-01-30 | Discharge: 2016-02-12 | DRG: 291 | Disposition: A | Discharge status: Home Health | Payer: Medicare HMO | Source: Ambulatory Visit | Attending: Cardiovascular Disease | Admitting: Cardiovascular Disease

## 2016-01-30 ENCOUNTER — Inpatient Hospital Stay (HOSPITAL_COMMUNITY): Payer: Medicare HMO

## 2016-01-30 DIAGNOSIS — H5461 Unqualified visual loss, right eye, normal vision left eye: Secondary | ICD-10-CM | POA: Diagnosis present

## 2016-01-30 DIAGNOSIS — Z87891 Personal history of nicotine dependence: Secondary | ICD-10-CM | POA: Diagnosis not present

## 2016-01-30 DIAGNOSIS — J449 Chronic obstructive pulmonary disease, unspecified: Secondary | ICD-10-CM | POA: Diagnosis not present

## 2016-01-30 DIAGNOSIS — Z794 Long term (current) use of insulin: Secondary | ICD-10-CM | POA: Diagnosis not present

## 2016-01-30 DIAGNOSIS — Z9981 Dependence on supplemental oxygen: Secondary | ICD-10-CM

## 2016-01-30 DIAGNOSIS — N186 End stage renal disease: Secondary | ICD-10-CM | POA: Diagnosis present

## 2016-01-30 DIAGNOSIS — E875 Hyperkalemia: Secondary | ICD-10-CM | POA: Diagnosis present

## 2016-01-30 DIAGNOSIS — J811 Chronic pulmonary edema: Secondary | ICD-10-CM

## 2016-01-30 DIAGNOSIS — I132 Hypertensive heart and chronic kidney disease with heart failure and with stage 5 chronic kidney disease, or end stage renal disease: Secondary | ICD-10-CM | POA: Diagnosis not present

## 2016-01-30 DIAGNOSIS — I5031 Acute diastolic (congestive) heart failure: Secondary | ICD-10-CM | POA: Diagnosis present

## 2016-01-30 DIAGNOSIS — Z96652 Presence of left artificial knee joint: Secondary | ICD-10-CM | POA: Diagnosis present

## 2016-01-30 DIAGNOSIS — D631 Anemia in chronic kidney disease: Secondary | ICD-10-CM | POA: Diagnosis present

## 2016-01-30 DIAGNOSIS — H5440 Blindness, one eye, unspecified eye: Secondary | ICD-10-CM

## 2016-01-30 DIAGNOSIS — I252 Old myocardial infarction: Secondary | ICD-10-CM | POA: Diagnosis not present

## 2016-01-30 DIAGNOSIS — R001 Bradycardia, unspecified: Secondary | ICD-10-CM | POA: Diagnosis not present

## 2016-01-30 DIAGNOSIS — J440 Chronic obstructive pulmonary disease with acute lower respiratory infection: Secondary | ICD-10-CM | POA: Diagnosis present

## 2016-01-30 DIAGNOSIS — G4733 Obstructive sleep apnea (adult) (pediatric): Secondary | ICD-10-CM | POA: Diagnosis present

## 2016-01-30 DIAGNOSIS — E1140 Type 2 diabetes mellitus with diabetic neuropathy, unspecified: Secondary | ICD-10-CM | POA: Diagnosis present

## 2016-01-30 DIAGNOSIS — Z6841 Body Mass Index (BMI) 40.0 and over, adult: Secondary | ICD-10-CM | POA: Diagnosis not present

## 2016-01-30 DIAGNOSIS — Z79899 Other long term (current) drug therapy: Secondary | ICD-10-CM

## 2016-01-30 DIAGNOSIS — I120 Hypertensive chronic kidney disease with stage 5 chronic kidney disease or end stage renal disease: Secondary | ICD-10-CM | POA: Diagnosis not present

## 2016-01-30 DIAGNOSIS — E1122 Type 2 diabetes mellitus with diabetic chronic kidney disease: Secondary | ICD-10-CM | POA: Diagnosis present

## 2016-01-30 DIAGNOSIS — I2510 Atherosclerotic heart disease of native coronary artery without angina pectoris: Secondary | ICD-10-CM | POA: Diagnosis present

## 2016-01-30 DIAGNOSIS — R05 Cough: Secondary | ICD-10-CM | POA: Diagnosis not present

## 2016-01-30 DIAGNOSIS — E1129 Type 2 diabetes mellitus with other diabetic kidney complication: Secondary | ICD-10-CM

## 2016-01-30 DIAGNOSIS — M109 Gout, unspecified: Secondary | ICD-10-CM | POA: Diagnosis present

## 2016-01-30 DIAGNOSIS — R0602 Shortness of breath: Secondary | ICD-10-CM | POA: Diagnosis not present

## 2016-01-30 DIAGNOSIS — Z9841 Cataract extraction status, right eye: Secondary | ICD-10-CM

## 2016-01-30 DIAGNOSIS — Z9842 Cataract extraction status, left eye: Secondary | ICD-10-CM

## 2016-01-30 DIAGNOSIS — N2581 Secondary hyperparathyroidism of renal origin: Secondary | ICD-10-CM | POA: Diagnosis present

## 2016-01-30 DIAGNOSIS — Z992 Dependence on renal dialysis: Secondary | ICD-10-CM | POA: Diagnosis not present

## 2016-01-30 DIAGNOSIS — I509 Heart failure, unspecified: Secondary | ICD-10-CM | POA: Diagnosis not present

## 2016-01-30 DIAGNOSIS — I953 Hypotension of hemodialysis: Secondary | ICD-10-CM | POA: Diagnosis not present

## 2016-01-30 DIAGNOSIS — Z8249 Family history of ischemic heart disease and other diseases of the circulatory system: Secondary | ICD-10-CM

## 2016-01-30 DIAGNOSIS — Z7982 Long term (current) use of aspirin: Secondary | ICD-10-CM

## 2016-01-30 DIAGNOSIS — R011 Cardiac murmur, unspecified: Secondary | ICD-10-CM | POA: Diagnosis present

## 2016-01-30 DIAGNOSIS — M1990 Unspecified osteoarthritis, unspecified site: Secondary | ICD-10-CM | POA: Diagnosis present

## 2016-01-30 DIAGNOSIS — I10 Essential (primary) hypertension: Secondary | ICD-10-CM | POA: Diagnosis not present

## 2016-01-30 DIAGNOSIS — Z961 Presence of intraocular lens: Secondary | ICD-10-CM | POA: Diagnosis present

## 2016-01-30 DIAGNOSIS — J209 Acute bronchitis, unspecified: Secondary | ICD-10-CM | POA: Diagnosis present

## 2016-01-30 DIAGNOSIS — Z09 Encounter for follow-up examination after completed treatment for conditions other than malignant neoplasm: Secondary | ICD-10-CM

## 2016-01-30 DIAGNOSIS — E119 Type 2 diabetes mellitus without complications: Secondary | ICD-10-CM | POA: Diagnosis not present

## 2016-01-30 DIAGNOSIS — I350 Nonrheumatic aortic (valve) stenosis: Secondary | ICD-10-CM | POA: Diagnosis present

## 2016-01-30 DIAGNOSIS — J208 Acute bronchitis due to other specified organisms: Secondary | ICD-10-CM | POA: Diagnosis not present

## 2016-01-30 DIAGNOSIS — D649 Anemia, unspecified: Secondary | ICD-10-CM | POA: Diagnosis not present

## 2016-01-30 DIAGNOSIS — I5021 Acute systolic (congestive) heart failure: Secondary | ICD-10-CM | POA: Diagnosis present

## 2016-01-30 DIAGNOSIS — Z90710 Acquired absence of both cervix and uterus: Secondary | ICD-10-CM

## 2016-01-30 HISTORY — DX: Dyspnea, unspecified: R06.00

## 2016-01-30 LAB — TROPONIN I: Troponin I: 0.03 ng/mL (ref <0.03)

## 2016-01-30 LAB — CBC WITH DIFFERENTIAL/PLATELET
BASOS ABS: 0 10*3/uL (ref 0.0–0.1)
BASOS PCT: 1 %
EOS ABS: 0.4 10*3/uL (ref 0.0–0.7)
EOS PCT: 6 %
HCT: 27.8 % — ABNORMAL LOW (ref 36.0–46.0)
Hemoglobin: 8.7 g/dL — ABNORMAL LOW (ref 12.0–15.0)
LYMPHS PCT: 21 %
Lymphs Abs: 1.3 10*3/uL (ref 0.7–4.0)
MCH: 30 pg (ref 26.0–34.0)
MCHC: 31.3 g/dL (ref 30.0–36.0)
MCV: 95.9 fL (ref 78.0–100.0)
MONO ABS: 0.6 10*3/uL (ref 0.1–1.0)
Monocytes Relative: 9 %
Neutro Abs: 3.8 10*3/uL (ref 1.7–7.7)
Neutrophils Relative %: 63 %
PLATELETS: 185 10*3/uL (ref 150–400)
RBC: 2.9 MIL/uL — AB (ref 3.87–5.11)
RDW: 16.1 % — AB (ref 11.5–15.5)
WBC: 6 10*3/uL (ref 4.0–10.5)

## 2016-01-30 LAB — BRAIN NATRIURETIC PEPTIDE: B NATRIURETIC PEPTIDE 5: 1568.3 pg/mL — AB (ref 0.0–100.0)

## 2016-01-30 LAB — GLUCOSE, CAPILLARY
GLUCOSE-CAPILLARY: 122 mg/dL — AB (ref 65–99)
Glucose-Capillary: 95 mg/dL (ref 65–99)

## 2016-01-30 LAB — COMPREHENSIVE METABOLIC PANEL
ALT: 11 U/L — ABNORMAL LOW (ref 14–54)
AST: 16 U/L (ref 15–41)
Albumin: 3 g/dL — ABNORMAL LOW (ref 3.5–5.0)
Alkaline Phosphatase: 73 U/L (ref 38–126)
Anion gap: 13 (ref 5–15)
BUN: 45 mg/dL — AB (ref 6–20)
CHLORIDE: 99 mmol/L — AB (ref 101–111)
CO2: 28 mmol/L (ref 22–32)
Calcium: 9.1 mg/dL (ref 8.9–10.3)
Creatinine, Ser: 7.77 mg/dL — ABNORMAL HIGH (ref 0.44–1.00)
GFR calc Af Amer: 5 mL/min — ABNORMAL LOW (ref >60)
GFR, EST NON AFRICAN AMERICAN: 5 mL/min — AB (ref >60)
Glucose, Bld: 105 mg/dL — ABNORMAL HIGH (ref 65–99)
Potassium: 5.4 mmol/L — ABNORMAL HIGH (ref 3.5–5.1)
SODIUM: 140 mmol/L (ref 135–145)
Total Bilirubin: 0.2 mg/dL — ABNORMAL LOW (ref 0.3–1.2)
Total Protein: 7.1 g/dL (ref 6.5–8.1)

## 2016-01-30 MED ORDER — ISOSORBIDE MONONITRATE ER 30 MG PO TB24
30.0000 mg | ORAL_TABLET | Freq: Every day | ORAL | Status: DC
  Administered 2016-01-31 – 2016-02-01 (×2): 30 mg via ORAL
  Filled 2016-01-30 (×2): qty 1

## 2016-01-30 MED ORDER — POTASSIUM CHLORIDE CRYS ER 20 MEQ PO TBCR: 20.0000 meq | EXTENDED_RELEASE_TABLET | Freq: Every day | ORAL | Status: DC

## 2016-01-30 MED ORDER — ALBUTEROL SULFATE (2.5 MG/3ML) 0.083% IN NEBU
2.5000 mg | INHALATION_SOLUTION | RESPIRATORY_TRACT | Status: DC | PRN
  Filled 2016-01-30: qty 3

## 2016-01-30 MED ORDER — SODIUM CHLORIDE 0.9% FLUSH: 3.0000 mL | INTRAVENOUS | Status: DC | PRN

## 2016-01-30 MED ORDER — HEPARIN SODIUM (PORCINE) 5000 UNIT/ML IJ SOLN
5000.0000 [IU] | Freq: Three times a day (TID) | INTRAMUSCULAR | Status: DC
  Administered 2016-01-30 – 2016-02-12 (×35): 5000 [IU] via SUBCUTANEOUS
  Filled 2016-01-30 (×33): qty 1

## 2016-01-30 MED ORDER — CARVEDILOL 25 MG PO TABS
25.0000 mg | ORAL_TABLET | Freq: Two times a day (BID) | ORAL | Status: DC
  Administered 2016-01-30 – 2016-02-01 (×3): 25 mg via ORAL
  Filled 2016-01-30 (×4): qty 1

## 2016-01-30 MED ORDER — ETHYL CHLORIDE EX AERO: 1.0000 "application " | INHALATION_SPRAY | Freq: Every day | CUTANEOUS | Status: DC | PRN

## 2016-01-30 MED ORDER — MIDODRINE HCL 5 MG PO TABS
10.0000 mg | ORAL_TABLET | Freq: Every day | ORAL | Status: DC
  Administered 2016-01-31 – 2016-02-01 (×2): 10 mg via ORAL
  Filled 2016-01-30 (×2): qty 2

## 2016-01-30 MED ORDER — NITROGLYCERIN 0.4 MG SL SUBL: 0.4000 mg | SUBLINGUAL_TABLET | SUBLINGUAL | Status: DC | PRN

## 2016-01-30 MED ORDER — ONDANSETRON HCL 4 MG/2ML IJ SOLN: 4.0000 mg | Freq: Four times a day (QID) | INTRAMUSCULAR | Status: DC | PRN

## 2016-01-30 MED ORDER — INSULIN GLARGINE 100 UNIT/ML ~~LOC~~ SOLN
5.0000 [IU] | Freq: Every day | SUBCUTANEOUS | Status: DC
  Filled 2016-01-30 (×2): qty 0.1

## 2016-01-30 MED ORDER — SODIUM CHLORIDE 0.9% FLUSH
3.0000 mL | Freq: Two times a day (BID) | INTRAVENOUS | Status: DC
Start: 2016-01-30 — End: 2016-02-12
  Administered 2016-01-30 – 2016-02-11 (×20): 3 mL via INTRAVENOUS

## 2016-01-30 MED ORDER — SODIUM CHLORIDE 0.9 % IV SOLN: 250.0000 mL | INTRAVENOUS | Status: DC | PRN

## 2016-01-30 MED ORDER — SEVELAMER CARBONATE 800 MG PO TABS
4000.0000 mg | ORAL_TABLET | Freq: Two times a day (BID) | ORAL | Status: DC
  Administered 2016-01-31 – 2016-02-06 (×10): 4000 mg via ORAL
  Filled 2016-01-30 (×11): qty 5

## 2016-01-30 MED ORDER — CINACALCET HCL 30 MG PO TABS
90.0000 mg | ORAL_TABLET | Freq: Every day | ORAL | Status: DC
  Administered 2016-01-31 – 2016-02-05 (×5): 90 mg via ORAL
  Filled 2016-01-30 (×6): qty 3

## 2016-01-30 MED ORDER — ADULT MULTIVITAMIN W/MINERALS CH
1.0000 | ORAL_TABLET | Freq: Every day | ORAL | Status: DC
  Administered 2016-01-31 – 2016-02-12 (×13): 1 via ORAL
  Filled 2016-01-30 (×13): qty 1

## 2016-01-30 MED ORDER — ACETAMINOPHEN 325 MG PO TABS
650.0000 mg | ORAL_TABLET | ORAL | Status: DC | PRN
  Administered 2016-02-01 – 2016-02-07 (×2): 650 mg via ORAL
  Filled 2016-01-30 (×2): qty 2

## 2016-01-30 MED ORDER — ASPIRIN EC 81 MG PO TBEC
81.0000 mg | DELAYED_RELEASE_TABLET | Freq: Every day | ORAL | Status: DC
  Administered 2016-01-30 – 2016-02-12 (×14): 81 mg via ORAL
  Filled 2016-01-30 (×14): qty 1

## 2016-01-30 MED ORDER — ALBUTEROL SULFATE HFA 108 (90 BASE) MCG/ACT IN AERS: 2.0000 | INHALATION_SPRAY | RESPIRATORY_TRACT | Status: DC | PRN

## 2016-01-30 MED ORDER — INSULIN ASPART 100 UNIT/ML ~~LOC~~ SOLN
0.0000 [IU] | Freq: Three times a day (TID) | SUBCUTANEOUS | Status: DC
  Administered 2016-02-01 (×2): 1 [IU] via SUBCUTANEOUS
  Administered 2016-02-04: 2 [IU] via SUBCUTANEOUS
  Administered 2016-02-04: 1 [IU] via SUBCUTANEOUS
  Administered 2016-02-05: 7 [IU] via SUBCUTANEOUS
  Administered 2016-02-05: 3 [IU] via SUBCUTANEOUS
  Administered 2016-02-06 (×2): 2 [IU] via SUBCUTANEOUS
  Administered 2016-02-06 – 2016-02-07 (×2): 3 [IU] via SUBCUTANEOUS
  Administered 2016-02-08: 2 [IU] via SUBCUTANEOUS
  Administered 2016-02-08: 1 [IU] via SUBCUTANEOUS
  Administered 2016-02-09 – 2016-02-10 (×2): 2 [IU] via SUBCUTANEOUS
  Administered 2016-02-10 (×2): 3 [IU] via SUBCUTANEOUS
  Administered 2016-02-11: 2 [IU] via SUBCUTANEOUS
  Administered 2016-02-11: 1 [IU] via SUBCUTANEOUS
  Administered 2016-02-11: 2 [IU] via SUBCUTANEOUS
  Administered 2016-02-12: 1 [IU] via SUBCUTANEOUS

## 2016-01-30 NOTE — H&P (Signed)
Referring Physician:  LAURIAN [name] is an 71 y.o. female.                       Chief Complaint: Shortness of breath  HPI: 71 year old female PMH of Arthritis, Blind right eye, CAD, ESRD, Gout, hypertension and type II DM has 1 week history of shortness of breath with occasional chest pain. No fever or cough.   Past Medical History:  Diagnosis Date  . Abdominal abscess ([place]) 12-17-10   abdominal abscesses x2 ? one at this time  . Anemia   . Arthritis    "all over" (08/23/2012)  . Blind right eye    fell and crushed socket and eye fell out, was replaced at [facility]  . CHF (congestive heart failure) ([place])   . Chronic lower back pain   . Coronary artery disease    Dr Desean Heemstra-cardiologist 2-3 x year  . Dyspnea   . Dysrhythmia    irregular, "skips beats"  . ESRD (end stage renal disease) on dialysis ([place])    "started 12/2011; [facility]; TTS" (08/23/2012  . Exertional shortness of breath   . Eye drainage    "lots; since fall 07/2011" (08/23/2012)  . Gout   . H/O hiatal hernia   . Headache(784.0)    "weekly sometimes; since I fell and hit my head in 07/2011 08/23/2012)  . Heart murmur   . History of blood transfusion    "lots before starting dialysis" (08/23/2012)  . Hypertension    sees Dr. Willey Blade  . Inhalation injury    "worked at [employer]; can't inhale polyurethane or paint, etc" (08/23/2012)  . Myocardial infarction 1970's or 1980's  . Neuromuscular disorder ([place])    diabetic neuropathy  . OSA on CPAP   . Staph aureus infection November 2012   . Swelling of both ankles 12-17-10  . Type II diabetes mellitus ([place])    "over 10 years now" (08/23/2012)      Past Surgical History:  Procedure Laterality Date  . APPENDECTOMY    . AV FISTULA PLACEMENT  09/30/2011   Procedure: ARTERIOVENOUS (AV) FISTULA CREATION;  Surgeon: Conrad Culpeper, MD;  Location: [facility];  Service: Vascular;  Laterality: Left;  BRACHIAL-CEPHALIC  . BACK SURGERY    . BASCILIC VEIN TRANSPOSITION Left  11/10/2011   Left arm   . [place] TRANSPOSITION  01/28/2012   Procedure: BASCILIC VEIN TRANSPOSITION;  Surgeon: Conrad Trevose, MD;  Location: [facility];  Service: Vascular;  Laterality: Left;  Second Stage   . CARDIAC CATHETERIZATION  1980's  . CATARACT EXTRACTION W/ INTRAOCULAR LENS IMPLANT Bilateral 1990's  . DILATION AND CURETTAGE OF UTERUS  1960's  . EYE SURGERY    . HERNIA REPAIR    . INSERTION OF DIALYSIS CATHETER  01/05/2012   Procedure: INSERTION OF DIALYSIS CATHETER;  Surgeon: Conrad Superior, MD;  Location: [facility];  Service: Vascular;  Laterality: N/A;  Right Internal Jugular Placement  . INTRAOCULAR PROSTHESES INSERTION Right 2013   "fell; knocked my eye outl" (08/23/2012)  . JOINT REPLACEMENT    . LAPAROTOMY  12/18/2010   Procedure: EXPLORATORY LAPAROTOMY;  Surgeon: Rolm Bookbinder, MD;  Location: [facility];  Service: General;  Laterality: N/A;  Abdominal Seroma Evacuation  . LEFT HEART CATHETERIZATION WITH CORONARY ANGIOGRAM N/A 08/25/2012   Procedure: LEFT HEART CATHETERIZATION WITH CORONARY ANGIOGRAM;  Surgeon: Birdie Riddle, MD;  Location: [facility];  Service: Cardiovascular;  Laterality: N/A;  .  LUMBAR DISC SURGERY  1970'-80's   X 3  . TOTAL KNEE ARTHROPLASTY Left 1980's  . VAGINAL HYSTERECTOMY  1970's  . VENTRAL HERNIA REPAIR  2012-2013   component separation, repair with biologic; "had 3 surgeries to fix it" (08/23/2012)  . WOUND DEBRIDEMENT  03/20/2011   Procedure: DEBRIDEMENT ABDOMINAL WOUND;  Surgeon: Rolm Bookbinder, MD;  Location: [facility];  Service: General;  Laterality: N/A;  debridement abdominal wall, placement of wound vac    Family History  Problem Relation Age of Onset  . Hypertension Mother   . Cancer Brother     spine  . Hypertension Father   . Cancer Sister     brain  . Hypertension Maternal Grandmother   . Anesthesia problems Neg Hx    Social History:  reports that she quit smoking about 35 years ago. Her smoking use included Cigarettes. She has a 4.00  pack-year smoking history. She has never used smokeless tobacco. She reports that she does not drink alcohol or use drugs.  Allergies: No Known Allergies  Medications Prior to Admission  Medication Sig Dispense Refill  . albuterol (PROVENTIL HFA;VENTOLIN HFA) 108 (90 BASE) MCG/ACT inhaler Inhale 2 puffs into the lungs every 4 (four) hours as needed for wheezing or shortness of breath.    Marland Kitchen aspirin EC 81 MG tablet Take 81 mg by mouth daily.      . bisacodyl (DULCOLAX) 5 MG EC tablet Take 10 mg by mouth daily as needed for moderate constipation.     . Calcium Carbonate-Vitamin D (CALCIUM 600 + D PO) Take 1 tablet by mouth daily.     . carvedilol (COREG) 25 MG tablet Take 25 mg by mouth 2 (two) times daily.     Marland Kitchen ethyl chloride spray Apply 1 application topically daily as needed. For HD  5  . indomethacin (INDOCIN) 50 MG capsule Take 50 mg by mouth 2 (two) times daily as needed (for gout pain).    . insulin glargine (LANTUS) 100 UNIT/ML injection Inject 5-10 Units into the skin at bedtime. Patient uses sliding scale.    . isosorbide mononitrate (IMDUR) 60 MG 24 hr tablet Take 0.5 tablets (30 mg total) by mouth daily.    . midodrine (PROAMATINE) 10 MG tablet Take 10 mg by mouth daily.  11  . Multiple Vitamin (MULITIVITAMIN WITH MINERALS) TABS Take 1 tablet by mouth daily.    . nitroGLYCERIN (NITROSTAT) 0.4 MG SL tablet Place 0.4 mg under the tongue every 5 (five) minutes as needed for chest pain.    . SENSIPAR 90 MG tablet Take 90 mg by mouth daily.  4  . sevelamer carbonate (RENVELA) 800 MG tablet Take 4,000 mg by mouth 2 (two) times daily with breakfast and lunch.     . potassium chloride SA (K-DUR,KLOR-CON) 20 MEQ tablet Take 20 mEq by mouth daily.  5    Results for orders placed or performed during the hospital encounter of 01/30/16 (from the past 48 hour(s))  Glucose, capillary     Status: Abnormal   Collection Time: 01/30/16  4:53 PM  Result Value Ref Range   Glucose-Capillary 122 (H) 65  - 99 mg/dL   Comment 1 Notify RN    Comment 2 Document in Chart    No results found.  Review Of Systems Constitutional: No fever, chills , weight loss or gain. Eyes: Positive vision change, Right eye blind post trauma. Wears glasses. Occasional discharge or pain.. Ears: No hearing loss, No tinnitus. Respiratory: No asthma, Positive  COPD, no pneumonias. Positive shortness of breath. No hemoptysis. Cardiovascular: Occasional chest pain, palpitation or leg edema. Gastrointestinal: No nausea, vomiting or diarrhea or constipation. No GI bleed. No hepatitis. Genitourinary: No dysuria, hematuria or kidney stone. Positive incontinance. Neurological: No headache, stroke or seizures.  Psychiatry: No psych facility admission for anxiety, depression or suicide. No detox. Skin: No rash. Musculoskeletal: Positive joint pain or fibromyalgia. Positive neck pain or back pain. Lymphadenopathy: No lymphadenopathy Hematology: Positive anemia, no easy bruising.   Blood pressure (!) 141/69, pulse 76, temperature 98.4 F (36.9 C), temperature source Oral, height 5\' 5"  (1.651 m), weight 130.8 kg (288 lb 5.8 oz), SpO2 100 %. Body mass index is 47.99 kg/m. General appearance: alert, cooperative, appears stated age and no distress Head: Normocephalic, atraumatic. 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 Left eyeconjunctivae/corneas clear.  Neck: no adenopathy, no carotid bruit, no JVD, supple, symmetrical, trachea midline and thyroid not enlarged. Resp: clear to auscultation bilaterally. Cardio: regular rate and rhythm, S1, S2 normal, II/VI systolic murmur, no click, rub or gallop GI: soft, non-tender; bowel sounds normal; no masses,  no organomegaly Extremities: No cyanosis or edema Skin: Warm and dry. No rashes or lesions Neurologic: Alert and oriented X 3, normal strength and tone. Slow gait with cane use.  Assessment/Plan Acute systolic left heart failure ESRD Type II  DM Hypertension CAD GOUT Arthritis  Admit Renal consult Home medications R/O MI/BNP/Echocardiogram  Birdie Riddle, MD  01/30/2016, 6:03 PM

## 2016-01-30 NOTE — Progress Notes (Signed)
Patient arrived in the unit accompanied by NT via wheelchair. Patient is a direct admit. Orientation to the unit given. Patient verbalizes understanding. Patient having shortness of breath 2L of O2 given via nasal cannula. Denies any pain at this time. Will continue to monitor.

## 2016-01-31 ENCOUNTER — Other Ambulatory Visit (HOSPITAL_COMMUNITY): Payer: Commercial Managed Care - HMO

## 2016-01-31 ENCOUNTER — Other Ambulatory Visit: Payer: Self-pay

## 2016-01-31 DIAGNOSIS — I10 Essential (primary) hypertension: Secondary | ICD-10-CM | POA: Diagnosis not present

## 2016-01-31 LAB — CBC
HCT: 24.4 % — ABNORMAL LOW (ref 36.0–46.0)
Hemoglobin: 7.8 g/dL — ABNORMAL LOW (ref 12.0–15.0)
MCH: 30.4 pg (ref 26.0–34.0)
MCHC: 32 g/dL (ref 30.0–36.0)
MCV: 94.9 fL (ref 78.0–100.0)
Platelets: 155 10*3/uL (ref 150–400)
RBC: 2.57 MIL/uL — ABNORMAL LOW (ref 3.87–5.11)
RDW: 16.1 % — AB (ref 11.5–15.5)
WBC: 4.2 10*3/uL (ref 4.0–10.5)

## 2016-01-31 LAB — GLUCOSE, CAPILLARY
GLUCOSE-CAPILLARY: 77 mg/dL (ref 65–99)
GLUCOSE-CAPILLARY: 128 mg/dL — AB (ref 65–99)
Glucose-Capillary: 153 mg/dL — ABNORMAL HIGH (ref 65–99)
Glucose-Capillary: 72 mg/dL (ref 65–99)

## 2016-01-31 LAB — BASIC METABOLIC PANEL
Anion gap: 13 (ref 5–15)
BUN: 51 mg/dL — AB (ref 6–20)
CALCIUM: 9 mg/dL (ref 8.9–10.3)
CO2: 29 mmol/L (ref 22–32)
CREATININE: 8.66 mg/dL — AB (ref 0.44–1.00)
Chloride: 97 mmol/L — ABNORMAL LOW (ref 101–111)
GFR calc Af Amer: 5 mL/min — ABNORMAL LOW (ref >60)
GFR, EST NON AFRICAN AMERICAN: 4 mL/min — AB (ref >60)
GLUCOSE: 85 mg/dL (ref 65–99)
Potassium: 5.6 mmol/L — ABNORMAL HIGH (ref 3.5–5.1)
SODIUM: 139 mmol/L (ref 135–145)

## 2016-01-31 LAB — TROPONIN I
TROPONIN I: 0.03 ng/mL — AB (ref <0.03)
TROPONIN I: 0.03 ng/mL — AB (ref <0.03)

## 2016-01-31 LAB — MRSA PCR SCREENING: MRSA BY PCR: NEGATIVE

## 2016-01-31 MED ORDER — ALBUTEROL SULFATE (2.5 MG/3ML) 0.083% IN NEBU
2.5000 mg | INHALATION_SOLUTION | Freq: Four times a day (QID) | RESPIRATORY_TRACT | Status: AC
  Administered 2016-01-31 (×2): 2.5 mg via RESPIRATORY_TRACT
  Filled 2016-01-31: qty 3

## 2016-01-31 MED ORDER — SODIUM CHLORIDE 0.9 % IV SOLN: 100.0000 mL | INTRAVENOUS | Status: DC | PRN

## 2016-01-31 MED ORDER — ALTEPLASE 2 MG IJ SOLR
2.0000 mg | Freq: Once | INTRAMUSCULAR | Status: DC | PRN
Start: 2016-01-31 — End: 2016-01-31

## 2016-01-31 MED ORDER — PENTAFLUOROPROP-TETRAFLUOROETH EX AERO: 1.0000 "application " | INHALATION_SPRAY | CUTANEOUS | Status: DC | PRN

## 2016-01-31 MED ORDER — LIDOCAINE-PRILOCAINE 2.5-2.5 % EX CREA: 1.0000 "application " | TOPICAL_CREAM | CUTANEOUS | Status: DC | PRN

## 2016-01-31 MED ORDER — LIDOCAINE HCL (PF) 1 % IJ SOLN: 5.0000 mL | INTRAMUSCULAR | Status: DC | PRN

## 2016-01-31 MED ORDER — HEPARIN SODIUM (PORCINE) 1000 UNIT/ML DIALYSIS
5500.0000 [IU] | INTRAMUSCULAR | Status: DC | PRN
Start: 2016-01-31 — End: 2016-01-31

## 2016-01-31 MED ORDER — INSULIN GLARGINE 100 UNIT/ML ~~LOC~~ SOLN
5.0000 [IU] | Freq: Every day | SUBCUTANEOUS | Status: DC
  Administered 2016-02-01 – 2016-02-11 (×11): 5 [IU] via SUBCUTANEOUS
  Filled 2016-01-31 (×13): qty 0.05

## 2016-01-31 MED ORDER — HEPARIN SODIUM (PORCINE) 1000 UNIT/ML DIALYSIS: 1000.0000 [IU] | INTRAMUSCULAR | Status: DC | PRN

## 2016-01-31 NOTE — Progress Notes (Signed)
Dialysis called to have pt taken off bipap.  Per dialysis RN, 4L fluid pulled off.  Pt states her breathing feels much better currently.  No distress noted, no increased WOB noted.  Sat 100% on 4 lpm [place].  RN aware.

## 2016-01-31 NOTE — Progress Notes (Signed)
Called to nurses station to speak with renal MD about patient needing bipap for 45 minutes to an hour because she is in distress at this time. It was explained by myself and Department Director that we are competent in maintaining bipap patients. This would require a qualified clinician to monitor the patient at the bedside. We are not able to provide that level of care. It has now been decided that the patient can go to dialysis within the next 30 minutes and the rapid response nurse will be able to monitor the patient on bipap until she goes to dialysis.

## 2016-01-31 NOTE — Progress Notes (Signed)
Ref: [name], MD   Subjective:  Today is dialysis day. Shortness of breath continues. Oxygen level is 100 % with 2 L oxygen.   Objective:  Vital Signs in the last 24 hours: Temp:  [98 F (36.7 C)-98.9 F (37.2 C)] 98.9 F (37.2 C) (01/11 0226) Pulse Rate:  [68-76] 69 (01/11 0627) Cardiac Rhythm: Normal sinus rhythm (01/11 0700) Resp:  [18-22] 18 (01/11 0627) BP: (122-141)/(47-69) 126/56 (01/11 0627) SpO2:  [100 %] 100 % (01/11 0627) FiO2 (%):  [2 %] 2 % (01/10 1534) Weight:  [130.8 kg (288 lb 5.8 oz)-131.4 kg (289 lb 9.6 oz)] 131.4 kg (289 lb 9.6 oz) (01/11 6606)  Physical Exam: BP Readings from Last 1 Encounters:  01/31/16 (!) 126/56    Wt Readings from Last 1 Encounters:  01/31/16 131.4 kg (289 lb 9.6 oz)    Weight change:  Body mass index is 48.19 kg/m. HEENT: [place]/AT, Eyes-Brown, PERL, EOMI, Conjunctiva-Pink, Sclera-Non-icteric Neck: + JVD, No bruit, Trachea midline. Lungs:  Basal crackles, Bilateral. Cardiac:  Regular rhythm, normal S1 and S2, no S3. II/VI systolic murmur. Abdomen:  Soft, non-tender. BS present. Extremities:  1 + edema present. No cyanosis. No clubbing. Left upper arm AV fistula. CNS: AxOx3, Cranial nerves grossly intact, moves all 4 extremities.  Skin: Warm and dry.   Intake/Output from previous day: 01/10 0701 - 01/11 0700 In: 240 [P.O.:240] Out: 0     Lab Results: BMET    Component Value Date/Time   NA 139 01/31/2016 0506   NA 140 01/30/2016 1848   NA 138 07/28/2015 0956   K 5.6 (H) 01/31/2016 0506   K 5.4 (H) 01/30/2016 1848   K 4.1 07/28/2015 0956   CL 97 (L) 01/31/2016 0506   CL 99 (L) 01/30/2016 1848   CL 101 07/28/2015 0956   CO2 29 01/31/2016 0506   CO2 28 01/30/2016 1848   CO2 26 07/28/2015 0956   GLUCOSE 85 01/31/2016 0506   GLUCOSE 105 (H) 01/30/2016 1848   GLUCOSE 162 (H) 07/28/2015 0956   BUN 51 (H) 01/31/2016 0506   BUN 45 (H) 01/30/2016 1848   BUN 36 (H) 07/28/2015 0956   CREATININE 8.66 (H) 01/31/2016 0506    CREATININE 7.77 (H) 01/30/2016 1848   CREATININE 7.04 (H) 07/28/2015 0956   CREATININE 2.86 (H) 10/10/2010 1140   CREATININE 2.20 (H) 06/26/2010 1637   CALCIUM 9.0 01/31/2016 0506   CALCIUM 9.1 01/30/2016 1848   CALCIUM 8.7 (L) 07/28/2015 0956   CALCIUM 8.7 01/08/2010 1121   CALCIUM 9.1 10/08/2009 0922   CALCIUM 8.6 07/30/2009 1102   GFRNONAA 4 (L) 01/31/2016 0506   GFRNONAA 5 (L) 01/30/2016 1848   GFRNONAA 5 (L) 07/28/2015 0956   GFRAA 5 (L) 01/31/2016 0506   GFRAA 5 (L) 01/30/2016 1848   GFRAA 6 (L) 07/28/2015 0956   CBC    Component Value Date/Time   WBC 6.0 01/30/2016 1848   RBC 2.90 (L) 01/30/2016 1848   HGB 8.7 (L) 01/30/2016 1848   HCT 27.8 (L) 01/30/2016 1848   PLT 185 01/30/2016 1848   MCV 95.9 01/30/2016 1848   MCH 30.0 01/30/2016 1848   MCHC 31.3 01/30/2016 1848   RDW 16.1 (H) 01/30/2016 1848   LYMPHSABS 1.3 01/30/2016 1848   MONOABS 0.6 01/30/2016 1848   EOSABS 0.4 01/30/2016 1848   BASOSABS 0.0 01/30/2016 1848   HEPATIC Function Panel  Recent Labs  01/30/16 1848  PROT 7.1   HEMOGLOBIN A1C No components found for: HGA1C,  MPG CARDIAC ENZYMES Lab Results  Component Value Date   CKTOTAL 87 04/13/2010   CKMB 1.0 04/13/2010   TROPONINI 0.03 (HH) 01/31/2016   TROPONINI 0.03 (HH) 01/30/2016   TROPONINI 0.03 (HH) 01/30/2016   BNP No results for input(s): PROBNP in the last 8760 hours. TSH No results for input(s): TSH in the last 8760 hours. CHOLESTEROL No results for input(s): CHOL in the last 8760 hours.  Scheduled Meds: . aspirin EC  81 mg Oral Daily  . carvedilol  25 mg Oral BID  . cinacalcet  90 mg Oral Q breakfast  . heparin  5,000 Units Subcutaneous Q8H  . insulin aspart  0-9 Units Subcutaneous TID WC  . insulin glargine  5-10 Units Subcutaneous QHS  . isosorbide mononitrate  30 mg Oral Daily  . midodrine  10 mg Oral Daily  . multivitamin with minerals  1 tablet Oral Daily  . potassium chloride SA  20 mEq Oral Daily  . sevelamer  carbonate  4,000 mg Oral BID WC  . sodium chloride flush  3 mL Intravenous Q12H   Continuous Infusions: PRN Meds:.sodium chloride, acetaminophen, albuterol, nitroGLYCERIN, ondansetron (ZOFRAN) IV, sodium chloride flush  Assessment/Plan: Acute left systolic heart failure ESRD Hyperkalemia from above Type II DM Hypertension CAD Gout Arthritis  Dr. Melvia Heaps notified for urgent need for dialysis. Patient reminded to decrease oral intake until dialysis is done. Cardiac cath with normal coronaries 4 years ago. Echocardiogram pending to r/o diastolic heart failure.   LOS: 1 day    Dixie Dials  MD  01/31/2016, 9:12 AM

## 2016-01-31 NOTE — Progress Notes (Signed)
Pt returns from dialysis where a reported 4 liters of fluid were removed. Pt denies discomfort, she does not appear to be in distress; she is maintaining high 90-100% on 5 L of Oxygen via nasal canula, successfully removed from BiPap before leaving dialysis.   Pt is eating dinner upon returning from diaylsis, blood sugar assessed during meal.

## 2016-01-31 NOTE — Significant Event (Signed)
Rapid Response Event Note  Overview: Time Called: 0930 Arrival Time: 0935 Event Type: Respiratory  Initial Focused Assessment: ESRD patient with fluid overload. Lung sounds rhonchi and crackles.  She is unable to speak in full sentences. She is moving well and fully alert and oriented.  She states that she has been SOB for about 3 weeks and has not missed any HD treatments. BP 142/69  SR 66  RR 26   Interventions: Placed on Bipap 16/5 Fio2 60%  Initially O2 sats 100%. Patient fell asleep with head tipped down to the right, O2 sats decreased to 85%. Increased FIO2 to 100% and Awoke patient and sats improved to 100%.  When she fell asleep again her head was tilted up to the left and O2 sats remained 100%  Plan of Care (if not transferred): 10:15am Plan is to transport patient to HD for treatment in 30 min. 11:10am Patient transported to HD with Bipap.  Medical staff to reassess patient and need for bipap post treatment. RN to call if assistance needed.  Event Summary: Name of Physician Notified: Dr Doylene Canard at bedside at    Name of Consulting Physician Notified: Dr Jonnie Finner saw patient prior to my arrival at          Raliegh Ip

## 2016-01-31 NOTE — Progress Notes (Signed)
Spoke with patient's niece, Venda Rodes, with patient's permission pts status/updates shared with caller. Niece was advised of pt's condition and transfer to dialysis; possible transfer to a different floor once HD is complete.  Niece expressed understanding, stating that she had been telling the patient to improve her self-care, but the pt has resisted.  [place] number for callback with update: [phone]

## 2016-01-31 NOTE — Patient Outreach (Signed)
[facility]) Care Management  01/31/2016  SHATON LORE Jan 01, 1946 [id number]  Patient hospitalized on yesterday. Hospital liaison aware of admit and will follow up. Will send discipline closure letter to physician.   Jone Baseman, RN, MSN [place] [phone]

## 2016-01-31 NOTE — Progress Notes (Signed)
Patient stable on 6L at this time. BIPAP not placed on at this time.

## 2016-01-31 NOTE — Progress Notes (Signed)
Patient has fallen asleep, increased O2 to 100% sats decreased to 85%.  Awoke patient and sats immediately improved to 95%

## 2016-01-31 NOTE — Consult Note (Addendum)
Renal Service Consult Note [facility]  [name] 01/31/2016 Roney Jaffe D Requesting Physician:  Dr Doylene Canard  Reason for Consult:  ESRD pt with SOB HPI: The patient is a 71 y.o. year-old with history of ESRD, DM2, HTN on HD x 5 years presented with SOB x last 3 weeks.  Has not missed HD.  No fevers, chills or prod cough. Whitish phlegm.  No CP, +orthopnea.  No access issues.  Asked to see for HD.  Today is HD day (TTS).     Pt lives alone in [place], has grown children, is widowed.  +hx MI, no stents/ surgery.  Hx hernia repair.  No cancer, CVA or DVT.     ROS  denies CP  no joint pain   no HA  no blurry vision  no rash  no diarrhea  no nausea/ vomiting   Past Medical History  Past Medical History:  Diagnosis Date  . Abdominal abscess ([place]) 12-17-10   abdominal abscesses x2 ? one at this time  . Anemia   . Arthritis    "all over" (08/23/2012)  . Blind right eye    fell and crushed socket and eye fell out, was replaced at [facility]  . CHF (congestive heart failure) ([place])   . Chronic lower back pain   . Coronary artery disease    Dr Kadakia-cardiologist 2-3 x year  . Dyspnea   . Dysrhythmia    irregular, "skips beats"  . ESRD (end stage renal disease) on dialysis ([place])    "started 12/2011; [place]; TTS" (08/23/2012  . Exertional shortness of breath   . Eye drainage    "lots; since fall 07/2011" (08/23/2012)  . Gout   . H/O hiatal hernia   . Headache(784.0)    "weekly sometimes; since I fell and hit my head in 07/2011 08/23/2012)  . Heart murmur   . History of blood transfusion    "lots before starting dialysis" (08/23/2012)  . Hypertension    sees Dr. Willey Blade  . Inhalation injury    "worked at [employer]; can't inhale polyurethane or paint, etc" (08/23/2012)  . Myocardial infarction 1970's or 1980's  . Neuromuscular disorder ([place])    diabetic neuropathy  . OSA on CPAP   . Staph aureus infection November 2012   . Swelling of both ankles  12-17-10  . Type II diabetes mellitus ([place])    "over 10 years now" (08/23/2012)   Past Surgical History  Past Surgical History:  Procedure Laterality Date  . APPENDECTOMY    . AV FISTULA PLACEMENT  09/30/2011   Procedure: ARTERIOVENOUS (AV) FISTULA CREATION;  Surgeon: Conrad Sipsey, MD;  Location: [facility];  Service: Vascular;  Laterality: Left;  BRACHIAL-CEPHALIC  . BACK SURGERY    . BASCILIC VEIN TRANSPOSITION Left 11/10/2011   Left arm   . [place] TRANSPOSITION  01/28/2012   Procedure: BASCILIC VEIN TRANSPOSITION;  Surgeon: Conrad [place], MD;  Location: [facility];  Service: Vascular;  Laterality: Left;  Second Stage   . CARDIAC CATHETERIZATION  1980's  . CATARACT EXTRACTION W/ INTRAOCULAR LENS IMPLANT Bilateral 1990's  . DILATION AND CURETTAGE OF UTERUS  1960's  . EYE SURGERY    . HERNIA REPAIR    . INSERTION OF DIALYSIS CATHETER  01/05/2012   Procedure: INSERTION OF DIALYSIS CATHETER;  Surgeon: Conrad [place], MD;  Location: [facility];  Service: Vascular;  Laterality: N/A;  Right Internal Jugular Placement  . INTRAOCULAR PROSTHESES INSERTION Right 2013   "  fell; knocked my eye outl" (08/23/2012)  . JOINT REPLACEMENT    . LAPAROTOMY  12/18/2010   Procedure: EXPLORATORY LAPAROTOMY;  Surgeon: Rolm Bookbinder, MD;  Location: [facility];  Service: General;  Laterality: N/A;  Abdominal Seroma Evacuation  . LEFT HEART CATHETERIZATION WITH CORONARY ANGIOGRAM N/A 08/25/2012   Procedure: LEFT HEART CATHETERIZATION WITH CORONARY ANGIOGRAM;  Surgeon: Birdie Riddle, MD;  Location: [facility];  Service: Cardiovascular;  Laterality: N/A;  . LUMBAR DISC SURGERY  1970'-80's   X 3  . TOTAL KNEE ARTHROPLASTY Left 1980's  . VAGINAL HYSTERECTOMY  1970's  . VENTRAL HERNIA REPAIR  2012-2013   component separation, repair with biologic; "had 3 surgeries to fix it" (08/23/2012)  . WOUND DEBRIDEMENT  03/20/2011   Procedure: DEBRIDEMENT ABDOMINAL WOUND;  Surgeon: Rolm Bookbinder, MD;  Location: [facility];  Service: General;   Laterality: N/A;  debridement abdominal wall, placement of wound vac   Family History  Family History  Problem Relation Age of Onset  . Hypertension Mother   . Cancer Brother     spine  . Hypertension Father   . Cancer Sister     brain  . Hypertension Maternal Grandmother   . Anesthesia problems Neg Hx    Social History  reports that she quit smoking about 35 years ago. Her smoking use included Cigarettes. She has a 4.00 pack-year smoking history. She has never used smokeless tobacco. She reports that she does not drink alcohol or use drugs. Allergies No Known Allergies Home medications Prior to Admission medications   Medication Sig Start Date End Date Taking? Authorizing Provider  albuterol (PROVENTIL HFA;VENTOLIN HFA) 108 (90 BASE) MCG/ACT inhaler Inhale 2 puffs into the lungs every 4 (four) hours as needed for wheezing or shortness of breath.   Yes Historical Provider, MD  aspirin EC 81 MG tablet Take 81 mg by mouth daily.     Yes Historical Provider, MD  bisacodyl (DULCOLAX) 5 MG EC tablet Take 10 mg by mouth daily as needed for moderate constipation.    Yes Historical Provider, MD  Calcium Carbonate-Vitamin D (CALCIUM 600 + D PO) Take 1 tablet by mouth daily.    Yes Historical Provider, MD  carvedilol (COREG) 25 MG tablet Take 25 mg by mouth 2 (two) times daily.  01/04/16  Yes Historical Provider, MD  ethyl chloride spray Apply 1 application topically daily as needed. For HD 01/10/16  Yes Historical Provider, MD  indomethacin (INDOCIN) 50 MG capsule Take 50 mg by mouth 2 (two) times daily as needed (for gout pain).   Yes Historical Provider, MD  insulin glargine (LANTUS) 100 UNIT/ML injection Inject 5-10 Units into the skin at bedtime. Patient uses sliding scale.   Yes Historical Provider, MD  isosorbide mononitrate (IMDUR) 60 MG 24 hr tablet Take 0.5 tablets (30 mg total) by mouth daily. 12/11/14  Yes Barton Dubois, MD  midodrine (PROAMATINE) 10 MG tablet Take 10 mg by mouth daily.  07/10/15  Yes Historical Provider, MD  Multiple Vitamin (MULITIVITAMIN WITH MINERALS) TABS Take 1 tablet by mouth daily.   Yes Historical Provider, MD  nitroGLYCERIN (NITROSTAT) 0.4 MG SL tablet Place 0.4 mg under the tongue every 5 (five) minutes as needed for chest pain.   Yes Historical Provider, MD  SENSIPAR 90 MG tablet Take 90 mg by mouth daily. 11/30/14  Yes Historical Provider, MD  sevelamer carbonate (RENVELA) 800 MG tablet Take 4,000 mg by mouth 2 (two) times daily with breakfast and lunch.    Yes  Historical Provider, MD  potassium chloride SA (K-DUR,KLOR-CON) 20 MEQ tablet Take 20 mEq by mouth daily. 06/08/15   Historical Provider, MD   Liver Function Tests  Recent Labs Lab 01/30/16 1848  AST 16  ALT 11*  ALKPHOS 73  BILITOT 0.2*  PROT 7.1  ALBUMIN 3.0*   No results for input(s): LIPASE, AMYLASE in the last 168 hours. CBC  Recent Labs Lab 01/30/16 1848  WBC 6.0  NEUTROABS 3.8  HGB 8.7*  HCT 27.8*  MCV 95.9  PLT 785   Basic Metabolic Panel  Recent Labs Lab 01/30/16 1848 01/31/16 0506  NA 140 139  K 5.4* 5.6*  CL 99* 97*  CO2 28 29  GLUCOSE 105* 85  BUN 45* 51*  CREATININE 7.77* 8.66*  CALCIUM 9.1 9.0   Iron/TIBC/Ferritin/ %Sat    Component Value Date/Time   IRON 56 12/17/2011 1000   TIBC 191 (L) 12/17/2011 1000   FERRITIN 1,014 (H) 12/17/2011 1000   IRONPCTSAT 29 12/17/2011 1000    Vitals:   01/30/16 1534 01/30/16 2111 01/31/16 0226 01/31/16 0627  BP: (!) 141/69 (!) 122/50 (!) 134/47 (!) 126/56  Pulse: 76 74 68 69  Resp:  (!) 22 20 18   Temp: 98.4 F (36.9 C) 98 F (36.7 C) 98.9 F (37.2 C)   TempSrc: Oral Oral Oral   SpO2: 100% 100% 100% 100%  Weight: 130.8 kg (288 lb 5.8 oz)   131.4 kg (289 lb 9.6 oz)  Height: 5\' 5"  (1.651 m)      Exam Gen SOB, not in severe distress, ^WOB No rash, cyanosis or gangrene Sclera anicteric, throat clear  No jvd or bruits Chest bibasilar insp rales, no wheezing RRR no MRG Abd soft ntnd no mass or  ascites +bs obese GU defer MS no joint effusions or deformity Ext 1+ bilat pretib edema / no wounds or ulcers Neuro is alert, Ox 3 , nf   CXR - bilat vasc congestsion and IS edema ECHO Nov '16 - normal LVEF, aortic sclerosis, mild MS  D[facility]    4.5h   130kg   2/2 bath   Hep 13000 (13K)   LUA AVF  - Calcitriol 2.5 ug  - Mircera 100 ug q 2  Assessment: 1. SOB/ pulm edema - going on for several wks in patient who doesn't miss HD as appears to be close to dry wt (if wt's are accurate).  May have lost lean body wt.  Suspect vol overload.  EKG w/o ischemia.  Plan HD this am asap.   2. ESRD HD TTS, good compliance 3. HTN - on coreg only at home, but also on midodrine and no hx of afib or syst CHF. Not sure why on both meds. BP is good today 4. IDDM   Plan - as above  Kelly Splinter MD [employer] pager [phone]   01/31/2016, 9:30 AM

## 2016-01-31 NOTE — Consult Note (Signed)
   [facility] CM Inpatient Consult   01/31/2016  STEPHENY CANAL April 13, 1945 [id number]   Patient discussed in HF progression meeting.  Patient is currently active with [facility] for chronic disease management services.  Patient has been engaged by a [facility], please see notes for details of follow up needs and issues.  Notified Dionne, Therapist, sports.  Chart reveals the patient is a 71 y.o. year-old with history of ESRD, DM2, HTN on HD x 5 years presented with SOB x last 3 weeks.  Has not missed HD.  No fevers, chills or prod cough. Whitish phlegm.  No CP, +orthopnea per MD note.    Made Inpatient Case Manager aware that [facility] following in progression meeting.  Came by to see the patient and she is having a medical management event with nursing.  Will follow up at amore appropriate time, if appropriate. Of note, [facility] Care Management services does not replace or interfere with any services that are needed or arranged by inpatient case management or social work.  For additional questions or referrals please contact:  Natividad Brood, RN BSN [facility] Liaison  [phone] business mobile phone Toll free office [phone]

## 2016-02-01 ENCOUNTER — Ambulatory Visit (HOSPITAL_COMMUNITY): Payer: Medicare HMO

## 2016-02-01 DIAGNOSIS — I509 Heart failure, unspecified: Secondary | ICD-10-CM

## 2016-02-01 DIAGNOSIS — I10 Essential (primary) hypertension: Secondary | ICD-10-CM | POA: Diagnosis not present

## 2016-02-01 LAB — ECHOCARDIOGRAM COMPLETE
AOVTI: 57.7 cm
AV Area VTI index: 0.84 cm2/m2
AV Area VTI: 1.82 cm2
AV Area mean vel: 1.87 cm2
AV Peak grad: 32 mmHg
AV peak Index: 0.79
AV pk vel: 285 cm/s
AVAREAMEANVIN: 0.81 cm2/m2
AVCELMEANRAT: 0.73
AVG: 15 mmHg
Ao pk vel: 0.72 m/s
CHL CUP AV VALUE AREA INDEX: 0.84
CHL CUP AV VEL: 1.93
CHL CUP DOP CALC LVOT VTI: 43.8 cm
CHL CUP LVOT MV VTI INDEX: 0.79 cm2/m2
CHL CUP LVOT MV VTI: 1.82
CHL CUP TV REG PEAK VELOCITY: 316 cm/s
DOP CAL AO MEAN VELOCITY: 177 cm/s
EERAT: 25.58
EWDT: 324 ms
FS: 33 % (ref 28–44)
Height: 65 in
IV/PV OW: 0.98
LA ID, A-P, ES: 52 mm
LA diam index: 2.27 cm/m2
LA vol A4C: 105 ml
LA vol index: 46.3 mL/m2
LA vol: 106 mL
LDCA: 2.54 cm2
LEFT ATRIUM END SYS DIAM: 52 mm
LV TDI E'MEDIAL: 6.23
LV e' LATERAL: 5.63 cm/s
LVEEAVG: 25.58
LVEEMED: 25.58
LVOT SV: 111 mL
LVOT diameter: 18 mm
LVOT peak VTI: 0.76 cm
LVOT peak grad rest: 17 mmHg
LVOTPV: 204 cm/s
Lateral S' vel: 10.7 cm/s
MV Dec: 324
MV M vel: 106
MV Peak grad: 8 mmHg
MV pk A vel: 137 m/s
MVANNULUSVTI: 61.2 cm
MVPKEVEL: 144 m/s
Mean grad: 5 mmHg
PW: 15.3 mm — AB (ref 0.6–1.1)
TAPSE: 20.9 mm
TDI e' lateral: 5.63
TRMAXVEL: 316 cm/s
Valve area: 1.93 cm2
Weight: 4536 oz

## 2016-02-01 LAB — GLUCOSE, CAPILLARY
Glucose-Capillary: 94 mg/dL (ref 65–99)
Glucose-Capillary: 131 mg/dL — ABNORMAL HIGH (ref 65–99)
Glucose-Capillary: 127 mg/dL — ABNORMAL HIGH (ref 65–99)
Glucose-Capillary: 92 mg/dL (ref 65–99)

## 2016-02-01 LAB — HEPATITIS B SURFACE ANTIGEN: HEP B S AG: NEGATIVE

## 2016-02-01 LAB — BASIC METABOLIC PANEL WITH GFR
Anion gap: 11 (ref 5–15)
BUN: 31 mg/dL — ABNORMAL HIGH (ref 6–20)
CO2: 28 mmol/L (ref 22–32)
Calcium: 8.9 mg/dL (ref 8.9–10.3)
Chloride: 97 mmol/L — ABNORMAL LOW (ref 101–111)
Creatinine, Ser: 6.88 mg/dL — ABNORMAL HIGH (ref 0.44–1.00)
GFR calc Af Amer: 6 mL/min — ABNORMAL LOW
GFR calc non Af Amer: 5 mL/min — ABNORMAL LOW
Glucose, Bld: 91 mg/dL (ref 65–99)
Potassium: 5 mmol/L (ref 3.5–5.1)
Sodium: 136 mmol/L (ref 135–145)

## 2016-02-01 MED ORDER — MIDODRINE HCL 5 MG PO TABS
10.0000 mg | ORAL_TABLET | Freq: Three times a day (TID) | ORAL | Status: DC
  Administered 2016-02-01 – 2016-02-03 (×9): 10 mg via ORAL
  Filled 2016-02-01 (×8): qty 2

## 2016-02-01 MED ORDER — ALBUTEROL SULFATE (2.5 MG/3ML) 0.083% IN NEBU
2.5000 mg | INHALATION_SOLUTION | Freq: Three times a day (TID) | RESPIRATORY_TRACT | Status: DC
  Administered 2016-02-01 (×2): 2.5 mg via RESPIRATORY_TRACT
  Filled 2016-02-01 (×4): qty 3

## 2016-02-01 MED ORDER — CEFTRIAXONE SODIUM 1 G IJ SOLR
1.0000 g | INTRAMUSCULAR | Status: DC
  Administered 2016-02-01 – 2016-02-07 (×7): 1 g via INTRAVENOUS
  Filled 2016-02-01 (×8): qty 10

## 2016-02-01 NOTE — Progress Notes (Signed)
Temp this am 101.2, gave Tylenol 650, MD notified and ordered blood cx x2, will continue to monitor, Thanks Arvella Nigh RN.

## 2016-02-01 NOTE — Progress Notes (Signed)
Per CCMD, pt has shown 'asystole' again on the monitor. Pt is again sitting upright, talking; she is asymptomatic. Tele box changed to # 16, while on the line with CCMD; verified box showing up and in Sinus Rhythm.

## 2016-02-01 NOTE — Progress Notes (Signed)
Call from [place] notes run of bigeminy; second call reports asystole. Upon entering the room, pt is up talking; no complaints; pt had one mis-located lead and one lead completely disconnected. Pt denies any sx in the recent past, presumably both episodes were related to detached/misplaced leads. Pts vitals are WNL, she is not in distress, her oxygen has been reduced to 4 liters a minute by nasal canula; at which she is maintaining saturation of 100%.

## 2016-02-01 NOTE — Consult Note (Signed)
   [facility] Inpatient Consult   02/01/2016  [name] 1945-06-06 [id number]   Patient is active with [facility] prior to admission.  Patient discussed in progression meeting that she has not been using her oxygen at home.  Met with the patient and family member at the bedside.  Patient confirms her primary care provider is Dr. [name].  She states she has [facility] for her primary insurance.  She consents to ongoing [facility] follow up for post hospital assessment.  Patient has an active consent on file. [facility] Care Management will follow up and does not interfere with or replace any services needed or arranged by the inpatient care management staff.  For questions, please contact:  Natividad Brood, RN BSN [facility] Liaison  [phone] business mobile phone Toll free office [phone]

## 2016-02-01 NOTE — Progress Notes (Addendum)
[facility] Progress Note   Subjective: SOB resolved with HD yesterday, about 4 kg removed. Overnight had fevers to 101.2. Is coughing w some greenish sputum.  No CP, no n/v/d.   Vitals:   01/31/16 2013 01/31/16 2200 02/01/16 0639 02/01/16 1000  BP: (!) 94/42 (!) 108/48 (!) 105/38 (!) 102/44  Pulse: 71 73 73 67  Resp: 18  18   Temp: 100.1 F (37.8 C)  (!) 101.2 F (38.4 C)   TempSrc: Oral  Oral   SpO2: 98% 100% 100% 100%  Weight:   128.6 kg (283 lb 8 oz)   Height:        Inpatient medications: . albuterol  2.5 mg Nebulization TID  . aspirin EC  81 mg Oral Daily  . carvedilol  25 mg Oral BID  . cefTRIAXone (ROCEPHIN)  IV  1 g Intravenous Q24H  . cinacalcet  90 mg Oral Q breakfast  . heparin  5,000 Units Subcutaneous Q8H  . insulin aspart  0-9 Units Subcutaneous TID WC  . insulin glargine  5 Units Subcutaneous QHS  . isosorbide mononitrate  30 mg Oral Daily  . midodrine  10 mg Oral Daily  . multivitamin with minerals  1 tablet Oral Daily  . sevelamer carbonate  4,000 mg Oral BID WC  . sodium chloride flush  3 mL Intravenous Q12H    sodium chloride, acetaminophen, nitroGLYCERIN, ondansetron (ZOFRAN) IV, sodium chloride flush  Exam: Gen comfortable, calm, no distress No rash, cyanosis or gangrene Sclera anicteric, throat clear  No jvd or bruits Chest mostly clear bilat RRR no MRG Abd soft ntnd no mass or ascites +bs obese Ext no LE edema/ no wounds or ulcers Neuro is alert, Ox 3 , nf   CXR - bilat vasc congestsion and IS edema ECHO Nov '16 - normal LVEF, aortic sclerosis, mild MS  D[facility]    4.5h   130kg   2/2 bath   Hep 13000 (13K)   LUA AVF  - Calcitriol 2.5 ug  - Mircera 100 ug q 2  Assessment: 1. SOB/ pulm edema - better w vol removal yest on HD.  Has lost weight and will need dry wt lowered.  2. Fevers - unclear cause, is coughing up some phlegm. L arm access not infected. Blood cx's sent, on Rocephin now. 3. ESRD HD  TTS 4. HTN - on coreg only at home, but also on midodrine and no hx of afib or syst CHF. Not sure why on both meds. BP's soft, will dc coreg and Imdur for now. Increased midodrine to tid.  5. IDDM    Plan - as above   Kelly Splinter MD [facility] pager [phone]   02/01/2016, 1:51 PM    Recent Labs Lab 01/30/16 1848 01/31/16 0506 02/01/16 0525  NA 140 139 136  K 5.4* 5.6* 5.0  CL 99* 97* 97*  CO2 28 29 28   GLUCOSE 105* 85 91  BUN 45* 51* 31*  CREATININE 7.77* 8.66* 6.88*  CALCIUM 9.1 9.0 8.9    Recent Labs Lab 01/30/16 1848  AST 16  ALT 11*  ALKPHOS 73  BILITOT 0.2*  PROT 7.1  ALBUMIN 3.0*    Recent Labs Lab 01/30/16 1848 01/31/16 1100  WBC 6.0 4.2  NEUTROABS 3.8  --   HGB 8.7* 7.8*  HCT 27.8* 24.4*  MCV 95.9 94.9  PLT 185 155   Iron/TIBC/Ferritin/ %Sat    Component Value Date/Time   IRON 56 12/17/2011 1000  TIBC 191 (L) 12/17/2011 1000   FERRITIN 1,014 (H) 12/17/2011 1000   IRONPCTSAT 29 12/17/2011 1000

## 2016-02-01 NOTE — Progress Notes (Signed)
CCMD calls stating that pt has been running asystole on the telemetry, pt visualized simultaneously as upright speaking on the phone with family. Pts leads adjusted, local box still reading asystole. Pt denies sx, HR 65 no signs of distress.

## 2016-02-01 NOTE — Progress Notes (Signed)
Ref: Tiffany Hatchet, MD   Subjective:  Had fever and cough. Breathing improving post hemodialysis.  Objective:  Vital Signs in the last 24 hours: Temp:  [99.1 F (37.3 C)-101.2 F (38.4 C)] 101.2 F (38.4 C) (01/12 0639) Pulse Rate:  [59-73] 67 (01/12 1000) Cardiac Rhythm: Normal sinus rhythm (01/12 0700) Resp:  [17-22] 18 (01/12 0639) BP: (94-153)/(38-61) 102/44 (01/12 1000) SpO2:  [98 %-100 %] 100 % (01/12 1000) Weight:  [128.6 kg (283 lb 8 oz)] 128.6 kg (283 lb 8 oz) (01/12 4888)  Physical Exam: BP Readings from Last 1 Encounters:  02/01/16 (!) 102/44    Wt Readings from Last 1 Encounters:  02/01/16 128.6 kg (283 lb 8 oz)    Weight change: 6.5 kg (14 lb 5.3 oz) Body mass index is 47.18 kg/m. HEENT: [place]/AT, Eyes-Brown, PERL, EOMI, Conjunctiva-Pink, Sclera-Non-icteric Neck: No JVD, No bruit, Trachea midline. Lungs:  Clearing, Bilateral. Cardiac:  Regular rhythm, normal S1 and S2, no S3. II/VI systolic murmur. Abdomen:  Soft, non-tender. BS present. Extremities:  Trace edema present. No cyanosis. No clubbing. AVF left upper arm. CNS: AxOx3, Cranial nerves grossly intact, moves all 4 extremities.  Skin: Warm and dry.   Intake/Output from previous day: 01/11 0701 - 01/12 0700 In: 620 [P.O.:620] Out: 3962     Lab Results: BMET    Component Value Date/Time   NA 136 02/01/2016 0525   NA 139 01/31/2016 0506   NA 140 01/30/2016 1848   K 5.0 02/01/2016 0525   K 5.6 (H) 01/31/2016 0506   K 5.4 (H) 01/30/2016 1848   CL 97 (L) 02/01/2016 0525   CL 97 (L) 01/31/2016 0506   CL 99 (L) 01/30/2016 1848   CO2 28 02/01/2016 0525   CO2 29 01/31/2016 0506   CO2 28 01/30/2016 1848   GLUCOSE 91 02/01/2016 0525   GLUCOSE 85 01/31/2016 0506   GLUCOSE 105 (H) 01/30/2016 1848   BUN 31 (H) 02/01/2016 0525   BUN 51 (H) 01/31/2016 0506   BUN 45 (H) 01/30/2016 1848   CREATININE 6.88 (H) 02/01/2016 0525   CREATININE 8.66 (H) 01/31/2016 0506   CREATININE 7.77 (H) 01/30/2016 1848   CREATININE 2.86 (H) 10/10/2010 1140   CREATININE 2.20 (H) 06/26/2010 1637   CALCIUM 8.9 02/01/2016 0525   CALCIUM 9.0 01/31/2016 0506   CALCIUM 9.1 01/30/2016 1848   CALCIUM 8.7 01/08/2010 1121   CALCIUM 9.1 10/08/2009 0922   CALCIUM 8.6 07/30/2009 1102   GFRNONAA 5 (L) 02/01/2016 0525   GFRNONAA 4 (L) 01/31/2016 0506   GFRNONAA 5 (L) 01/30/2016 1848   GFRAA 6 (L) 02/01/2016 0525   GFRAA 5 (L) 01/31/2016 0506   GFRAA 5 (L) 01/30/2016 1848   CBC    Component Value Date/Time   WBC 4.2 01/31/2016 1100   RBC 2.57 (L) 01/31/2016 1100   HGB 7.8 (L) 01/31/2016 1100   HCT 24.4 (L) 01/31/2016 1100   PLT 155 01/31/2016 1100   MCV 94.9 01/31/2016 1100   MCH 30.4 01/31/2016 1100   MCHC 32.0 01/31/2016 1100   RDW 16.1 (H) 01/31/2016 1100   LYMPHSABS 1.3 01/30/2016 1848   MONOABS 0.6 01/30/2016 1848   EOSABS 0.4 01/30/2016 1848   BASOSABS 0.0 01/30/2016 1848   HEPATIC Function Panel  Recent Labs  01/30/16 1848  PROT 7.1   HEMOGLOBIN A1C No components found for: HGA1C,  MPG CARDIAC ENZYMES Lab Results  Component Value Date   CKTOTAL 87 04/13/2010   CKMB 1.0 04/13/2010   TROPONINI 0.03 (  HH) 01/31/2016   TROPONINI 0.03 (HH) 01/30/2016   TROPONINI 0.03 (HH) 01/30/2016   BNP No results for input(s): PROBNP in the last 8760 hours. TSH No results for input(s): TSH in the last 8760 hours. CHOLESTEROL No results for input(s): CHOL in the last 8760 hours.  Scheduled Meds: . albuterol  2.5 mg Nebulization TID  . aspirin EC  81 mg Oral Daily  . carvedilol  25 mg Oral BID  . cefTRIAXone (ROCEPHIN)  IV  1 g Intravenous Q24H  . cinacalcet  90 mg Oral Q breakfast  . heparin  5,000 Units Subcutaneous Q8H  . insulin aspart  0-9 Units Subcutaneous TID WC  . insulin glargine  5 Units Subcutaneous QHS  . isosorbide mononitrate  30 mg Oral Daily  . midodrine  10 mg Oral Daily  . multivitamin with minerals  1 tablet Oral Daily  . sevelamer carbonate  4,000 mg Oral BID WC  . sodium  chloride flush  3 mL Intravenous Q12H   Continuous Infusions: PRN Meds:.sodium chloride, acetaminophen, nitroGLYCERIN, ondansetron (ZOFRAN) IV, sodium chloride flush  Assessment/Plan: Acute bronchitis Acute left heart systolic failure. ESRD Hyperkalemia - improved Type II DM Hypertension CAD Gout Arthritis  Add Rocephin for acute bronchitis Echocardiogram pending.   LOS: 2 days    Dixie Dials  MD  02/01/2016, 12:21 PM

## 2016-02-01 NOTE — Care Management Note (Signed)
Case Management Note  Patient Details  Name: [name] MRN: [id number] Date of Birth: [birth year]  Subjective/Objective:        Admitted with CHF            Action/Plan: Patient lives alone at home, has a personal care service / nurses aide assist in her care 2 hrs 5 days a week; PCP is Dr [name] [facility]; has private insurance with [facility] Medicare with prescription drug coverage; pharmacy of choice is [facility]; patient reports no problem getting her medication; DME - cane, walker at home; she has scales at home and knows to weigh herself daily; Her aide takes her to her apts; patient could benefit from a Disease Management program for CHF; [place] choice offered, patient chose [place]; Hoyle Sauer with [facility] called for arrangements;CM will continue to follow for DCP  Expected Discharge Date:    possibly 02/05/2016              Expected Discharge Plan:   Home with [facility]  In-House Referral:   [facility] program for CHF  [place] - RN/PT     Status of Service:   In progress  Sherrilyn Rist [phone] 02/01/2016, 1:27 PM

## 2016-02-02 ENCOUNTER — Inpatient Hospital Stay (HOSPITAL_COMMUNITY): Payer: Medicare HMO

## 2016-02-02 DIAGNOSIS — I10 Essential (primary) hypertension: Secondary | ICD-10-CM | POA: Diagnosis not present

## 2016-02-02 LAB — RENAL FUNCTION PANEL
Albumin: 2.7 g/dL — ABNORMAL LOW (ref 3.5–5.0)
Anion gap: 12 (ref 5–15)
BUN: 55 mg/dL — AB (ref 6–20)
CALCIUM: 8.3 mg/dL — AB (ref 8.9–10.3)
CO2: 27 mmol/L (ref 22–32)
CREATININE: 9.95 mg/dL — AB (ref 0.44–1.00)
Chloride: 95 mmol/L — ABNORMAL LOW (ref 101–111)
GFR calc non Af Amer: 3 mL/min — ABNORMAL LOW (ref >60)
GFR, EST AFRICAN AMERICAN: 4 mL/min — AB (ref >60)
GLUCOSE: 75 mg/dL (ref 65–99)
Phosphorus: 5 mg/dL — ABNORMAL HIGH (ref 2.5–4.6)
Potassium: 5.5 mmol/L — ABNORMAL HIGH (ref 3.5–5.1)
SODIUM: 134 mmol/L — AB (ref 135–145)

## 2016-02-02 LAB — GLUCOSE, CAPILLARY
GLUCOSE-CAPILLARY: 74 mg/dL (ref 65–99)
GLUCOSE-CAPILLARY: 106 mg/dL — AB (ref 65–99)
Glucose-Capillary: 96 mg/dL (ref 65–99)
Glucose-Capillary: 84 mg/dL (ref 65–99)
Glucose-Capillary: 120 mg/dL — ABNORMAL HIGH (ref 65–99)

## 2016-02-02 LAB — BASIC METABOLIC PANEL
ANION GAP: 10 (ref 5–15)
BUN: 56 mg/dL — ABNORMAL HIGH (ref 6–20)
CALCIUM: 8.2 mg/dL — AB (ref 8.9–10.3)
CHLORIDE: 95 mmol/L — AB (ref 101–111)
CO2: 27 mmol/L (ref 22–32)
Creatinine, Ser: 9.77 mg/dL — ABNORMAL HIGH (ref 0.44–1.00)
GFR calc non Af Amer: 4 mL/min — ABNORMAL LOW (ref >60)
GFR, EST AFRICAN AMERICAN: 4 mL/min — AB (ref >60)
Glucose, Bld: 72 mg/dL (ref 65–99)
Potassium: 5.5 mmol/L — ABNORMAL HIGH (ref 3.5–5.1)
Sodium: 132 mmol/L — ABNORMAL LOW (ref 135–145)

## 2016-02-02 LAB — CBC
HCT: 24 % — ABNORMAL LOW (ref 36.0–46.0)
Hemoglobin: 7.6 g/dL — ABNORMAL LOW (ref 12.0–15.0)
MCH: 29.3 pg (ref 26.0–34.0)
MCHC: 31.7 g/dL (ref 30.0–36.0)
MCV: 92.7 fL (ref 78.0–100.0)
PLATELETS: 170 10*3/uL (ref 150–400)
RBC: 2.59 MIL/uL — AB (ref 3.87–5.11)
RDW: 15.8 % — AB (ref 11.5–15.5)
WBC: 5.7 10*3/uL (ref 4.0–10.5)

## 2016-02-02 MED ORDER — HEPARIN SODIUM (PORCINE) 1000 UNIT/ML DIALYSIS: 1000.0000 [IU] | INTRAMUSCULAR | Status: DC | PRN

## 2016-02-02 MED ORDER — ALTEPLASE 2 MG IJ SOLR: 2.0000 mg | Freq: Once | INTRAMUSCULAR | Status: DC | PRN

## 2016-02-02 MED ORDER — LIDOCAINE HCL (PF) 1 % IJ SOLN: 5.0000 mL | INTRAMUSCULAR | Status: DC | PRN

## 2016-02-02 MED ORDER — MIDODRINE HCL 5 MG PO TABS
ORAL_TABLET | ORAL | Status: AC
  Filled 2016-02-02: qty 2

## 2016-02-02 MED ORDER — IPRATROPIUM-ALBUTEROL 0.5-2.5 (3) MG/3ML IN SOLN
3.0000 mL | Freq: Three times a day (TID) | RESPIRATORY_TRACT | Status: DC
  Administered 2016-02-02 – 2016-02-03 (×5): 3 mL via RESPIRATORY_TRACT
  Filled 2016-02-02 (×5): qty 3

## 2016-02-02 MED ORDER — PENTAFLUOROPROP-TETRAFLUOROETH EX AERO: 1.0000 "application " | INHALATION_SPRAY | CUTANEOUS | Status: DC | PRN

## 2016-02-02 MED ORDER — IPRATROPIUM BROMIDE 0.02 % IN SOLN: 0.5000 mg | Freq: Three times a day (TID) | RESPIRATORY_TRACT | Status: DC

## 2016-02-02 MED ORDER — SODIUM CHLORIDE 0.9 % IV SOLN: 100.0000 mL | INTRAVENOUS | Status: DC | PRN

## 2016-02-02 MED ORDER — LIDOCAINE-PRILOCAINE 2.5-2.5 % EX CREA: 1.0000 "application " | TOPICAL_CREAM | CUTANEOUS | Status: DC | PRN

## 2016-02-02 MED ORDER — HEPARIN SODIUM (PORCINE) 1000 UNIT/ML DIALYSIS
6500.0000 [IU] | INTRAMUSCULAR | Status: DC | PRN
  Filled 2016-02-02: qty 7

## 2016-02-02 NOTE — Progress Notes (Signed)
Pts oxygen saturation drops to 87% on room air at rest. Pt does rise to 96% (at rest) rather quickly once O2 is applied at 3-4 Liters.

## 2016-02-02 NOTE — Progress Notes (Signed)
Pt gone to dialysis prior to shift start. CCMD notified that patient is not on the floor at this time.

## 2016-02-02 NOTE — Progress Notes (Signed)
Pt reports improvement in congested breathing since breathing treatment received s/p dialysis.  Pt is upright, moved to chair- talking on the phone with family. Pt denies pain/complaints at this time.    Per MD pts room-air-at-rest saturation should be documented as less than 88% to qualify for maintenance of home O2, however in 10 minutes of room air, at rest, pt never dropped below 94%.   O2 amount has been decreased to 4L nasal canula by this nurse x 24 hours now. Pt is tolerating the reduction well.

## 2016-02-02 NOTE — Progress Notes (Signed)
Respiratory therapist contacted to request breathing treatment for patient.

## 2016-02-02 NOTE — Progress Notes (Signed)
[facility] Progress Note   Subjective: no c/o's, on HD  Vitals:   02/02/16 0900 02/02/16 0930 02/02/16 1000 02/02/16 1030  BP: (!) 105/41 (!) 117/37 (!) 102/46 (!) 98/41  Pulse: (!) 58 (!) 57 (!) 59 (!) 58  Resp: 18 17 19 18   Temp:      TempSrc:      SpO2: 100% 100% 100% 100%  Weight:      Height:        Inpatient medications: . albuterol  2.5 mg Nebulization TID  . aspirin EC  81 mg Oral Daily  . cefTRIAXone (ROCEPHIN)  IV  1 g Intravenous Q24H  . cinacalcet  90 mg Oral Q breakfast  . heparin  5,000 Units Subcutaneous Q8H  . insulin aspart  0-9 Units Subcutaneous TID WC  . insulin glargine  5 Units Subcutaneous QHS  . midodrine  10 mg Oral TID  . multivitamin with minerals  1 tablet Oral Daily  . sevelamer carbonate  4,000 mg Oral BID WC  . sodium chloride flush  3 mL Intravenous Q12H    sodium chloride, sodium chloride, sodium chloride, acetaminophen, alteplase, heparin, [START ON 02/03/2016] heparin, lidocaine (PF), lidocaine-prilocaine, nitroGLYCERIN, ondansetron (ZOFRAN) IV, pentafluoroprop-tetrafluoroeth, sodium chloride flush  Exam: Gen comfortable, calm, no distress, obese No rash, cyanosis or gangrene Sclera anicteric, throat clear  No jvd or bruits Chest clear bilat RRR no MRG Abd soft ntnd no mass or ascites +bs obese Ext no LE edema/ no wounds or ulcers Neuro is alert, Ox 3 , nf   CXR - bilat vasc congestsion and IS edema ECHO Nov '16 - normal LVEF, aortic sclerosis, mild MS  D[facility]    4.5h   130kg   2/2 bath   Hep 13000 (13K)   LUA AVF  - Calcitriol 2.5 ug  - Mircera 100 ug q 2  Assessment: 1. SOB/ pulm edema - better w vol removal yest on HD.  Has lost weight and will need dry wt lowered. Will repeat CXR.   2. Fevers - unclear cause, is coughing up some phlegm. L arm access not infected. Blood cx's neg, on IV Rocephin. Stable 3. ESRD HD TTS 4. Hypotension - dc'd coreg and Imdur, ^'d midodrine 10 tid. BP's somewhat  better.  5. IDDM    Plan - as above   Kelly Splinter MD [facility] pager [phone]   02/02/2016, 10:53 AM    Recent Labs Lab 02/01/16 0525 02/02/16 0611 02/02/16 0744  NA 136 132* 134*  K 5.0 5.5* 5.5*  CL 97* 95* 95*  CO2 28 27 27   GLUCOSE 91 72 75  BUN 31* 56* 55*  CREATININE 6.88* 9.77* 9.95*  CALCIUM 8.9 8.2* 8.3*  PHOS  --   --  5.0*    Recent Labs Lab 01/30/16 1848 02/02/16 0744  AST 16  --   ALT 11*  --   ALKPHOS 73  --   BILITOT 0.2*  --   PROT 7.1  --   ALBUMIN 3.0* 2.7*    Recent Labs Lab 01/30/16 1848 01/31/16 1100 02/02/16 0745  WBC 6.0 4.2 5.7  NEUTROABS 3.8  --   --   HGB 8.7* 7.8* 7.6*  HCT 27.8* 24.4* 24.0*  MCV 95.9 94.9 92.7  PLT 185 155 170   Iron/TIBC/Ferritin/ %Sat    Component Value Date/Time   IRON 56 12/17/2011 1000   TIBC 191 (L) 12/17/2011 1000   FERRITIN 1,014 (H) 12/17/2011 1000   IRONPCTSAT  29 12/17/2011 1000     

## 2016-02-02 NOTE — Progress Notes (Signed)
Ref: [name], MD   Subjective:  Decreasing shortness of breath. Afebrile. She has stopped CPAP and home oxygen use on her own per family.  Objective:  Vital Signs in the last 24 hours: Temp:  [98 F (36.7 C)-98.7 F (37.1 C)] 98.6 F (37 C) (01/13 1218) Pulse Rate:  [56-68] 62 (01/13 1218) Cardiac Rhythm: Normal sinus rhythm (01/13 1130) Resp:  [15-20] 20 (01/13 1218) BP: (86-124)/(37-55) 86/44 (01/13 1218) SpO2:  [100 %] 100 % (01/13 1218) FiO2 (%):  [100 %] 100 % (01/12 2126) Weight:  [126 kg (277 lb 12.5 oz)-129 kg (284 lb 6.3 oz)] 126 kg (277 lb 12.5 oz) (01/13 1120)  Physical Exam: BP Readings from Last 1 Encounters:  02/02/16 (!) 86/44    Wt Readings from Last 1 Encounters:  02/02/16 126 kg (277 lb 12.5 oz)    Weight change: -8.342 kg (-18 lb 6.3 oz) Body mass index is 46.22 kg/m. HEENT: [place]/AT, Eyes-Brown, PERL, EOMI, Conjunctiva-Pink, Sclera-Non-icteric Neck: No JVD, No bruit, Trachea midline. Lungs:  Crackles, Bilateral. Cardiac:  Regular rhythm, normal S1 and S2, no S3. II/VI systolic murmur. Abdomen:  Soft, non-tender. BS present. Extremities:  No edema present. No cyanosis. No clubbing. AVF - left upper arm. CNS: AxOx3, Cranial nerves grossly intact, moves all 4 extremities.  Skin: Warm and dry.   Intake/Output from previous day: 01/12 0701 - 01/13 0700 In: 1173 [P.O.:1170; I.V.:3] Out: 1 [Stool:1]    Lab Results: BMET    Component Value Date/Time   NA 134 (L) 02/02/2016 0744   NA 132 (L) 02/02/2016 0611   NA 136 02/01/2016 0525   K 5.5 (H) 02/02/2016 0744   K 5.5 (H) 02/02/2016 0611   K 5.0 02/01/2016 0525   CL 95 (L) 02/02/2016 0744   CL 95 (L) 02/02/2016 0611   CL 97 (L) 02/01/2016 0525   CO2 27 02/02/2016 0744   CO2 27 02/02/2016 0611   CO2 28 02/01/2016 0525   GLUCOSE 75 02/02/2016 0744   GLUCOSE 72 02/02/2016 0611   GLUCOSE 91 02/01/2016 0525   BUN 55 (H) 02/02/2016 0744   BUN 56 (H) 02/02/2016 0611   BUN 31 (H) 02/01/2016 0525    CREATININE 9.95 (H) 02/02/2016 0744   CREATININE 9.77 (H) 02/02/2016 0611   CREATININE 6.88 (H) 02/01/2016 0525   CREATININE 2.86 (H) 10/10/2010 1140   CREATININE 2.20 (H) 06/26/2010 1637   CALCIUM 8.3 (L) 02/02/2016 0744   CALCIUM 8.2 (L) 02/02/2016 0611   CALCIUM 8.9 02/01/2016 0525   CALCIUM 8.7 01/08/2010 1121   CALCIUM 9.1 10/08/2009 0922   CALCIUM 8.6 07/30/2009 1102   GFRNONAA 3 (L) 02/02/2016 0744   GFRNONAA 4 (L) 02/02/2016 0611   GFRNONAA 5 (L) 02/01/2016 0525   GFRAA 4 (L) 02/02/2016 0744   GFRAA 4 (L) 02/02/2016 0611   GFRAA 6 (L) 02/01/2016 0525   CBC    Component Value Date/Time   WBC 5.7 02/02/2016 0745   RBC 2.59 (L) 02/02/2016 0745   HGB 7.6 (L) 02/02/2016 0745   HCT 24.0 (L) 02/02/2016 0745   PLT 170 02/02/2016 0745   MCV 92.7 02/02/2016 0745   MCH 29.3 02/02/2016 0745   MCHC 31.7 02/02/2016 0745   RDW 15.8 (H) 02/02/2016 0745   LYMPHSABS 1.3 01/30/2016 1848   MONOABS 0.6 01/30/2016 1848   EOSABS 0.4 01/30/2016 1848   BASOSABS 0.0 01/30/2016 1848   HEPATIC Function Panel  Recent Labs  01/30/16 1848  PROT 7.1   HEMOGLOBIN A1C  No components found for: HGA1C,  MPG CARDIAC ENZYMES Lab Results  Component Value Date   CKTOTAL 87 04/13/2010   CKMB 1.0 04/13/2010   TROPONINI 0.03 (HH) 01/31/2016   TROPONINI 0.03 (HH) 01/30/2016   TROPONINI 0.03 (HH) 01/30/2016   BNP No results for input(s): PROBNP in the last 8760 hours. TSH No results for input(s): TSH in the last 8760 hours. CHOLESTEROL No results for input(s): CHOL in the last 8760 hours.  Scheduled Meds: . albuterol  2.5 mg Nebulization TID  . aspirin EC  81 mg Oral Daily  . cefTRIAXone (ROCEPHIN)  IV  1 g Intravenous Q24H  . cinacalcet  90 mg Oral Q breakfast  . heparin  5,000 Units Subcutaneous Q8H  . insulin aspart  0-9 Units Subcutaneous TID WC  . insulin glargine  5 Units Subcutaneous QHS  . midodrine  10 mg Oral TID  . multivitamin with minerals  1 tablet Oral Daily  .  sevelamer carbonate  4,000 mg Oral BID WC  . sodium chloride flush  3 mL Intravenous Q12H   Continuous Infusions: PRN Meds:.sodium chloride, acetaminophen, nitroGLYCERIN, ondansetron (ZOFRAN) IV, sodium chloride flush  Assessment/Plan: Acute bronchitis Acute left heart diastolic failure ESRD CAD COPD Type II DM Arthritis  Continue antibiotic and breathing treatments.   LOS: 3 days    Dixie Dials  MD  02/02/2016, 1:38 PM

## 2016-02-03 ENCOUNTER — Inpatient Hospital Stay (HOSPITAL_COMMUNITY): Payer: Medicare HMO

## 2016-02-03 DIAGNOSIS — I10 Essential (primary) hypertension: Secondary | ICD-10-CM | POA: Diagnosis not present

## 2016-02-03 LAB — GLUCOSE, CAPILLARY
GLUCOSE-CAPILLARY: 38 mg/dL — AB (ref 65–99)
GLUCOSE-CAPILLARY: 84 mg/dL (ref 65–99)
GLUCOSE-CAPILLARY: 89 mg/dL (ref 65–99)
Glucose-Capillary: 75 mg/dL (ref 65–99)
Glucose-Capillary: 183 mg/dL — ABNORMAL HIGH (ref 65–99)

## 2016-02-03 LAB — CBC
HCT: 26.7 % — ABNORMAL LOW (ref 36.0–46.0)
Hemoglobin: 8.4 g/dL — ABNORMAL LOW (ref 12.0–15.0)
MCH: 29.4 pg (ref 26.0–34.0)
MCHC: 31.5 g/dL (ref 30.0–36.0)
MCV: 93.4 fL (ref 78.0–100.0)
Platelets: 171 10*3/uL (ref 150–400)
RBC: 2.86 MIL/uL — ABNORMAL LOW (ref 3.87–5.11)
RDW: 15.8 % — AB (ref 11.5–15.5)
WBC: 4.6 10*3/uL (ref 4.0–10.5)

## 2016-02-03 LAB — RENAL FUNCTION PANEL
ALBUMIN: 2.9 g/dL — AB (ref 3.5–5.0)
Anion gap: 11 (ref 5–15)
BUN: 36 mg/dL — AB (ref 6–20)
CALCIUM: 8.8 mg/dL — AB (ref 8.9–10.3)
CO2: 28 mmol/L (ref 22–32)
Chloride: 95 mmol/L — ABNORMAL LOW (ref 101–111)
Creatinine, Ser: 7.15 mg/dL — ABNORMAL HIGH (ref 0.44–1.00)
GFR calc Af Amer: 6 mL/min — ABNORMAL LOW (ref >60)
GFR calc non Af Amer: 5 mL/min — ABNORMAL LOW (ref >60)
GLUCOSE: 126 mg/dL — AB (ref 65–99)
PHOSPHORUS: 5.2 mg/dL — AB (ref 2.5–4.6)
Potassium: 4.3 mmol/L (ref 3.5–5.1)
SODIUM: 134 mmol/L — AB (ref 135–145)

## 2016-02-03 MED ORDER — METHYLPREDNISOLONE SODIUM SUCC 125 MG IJ SOLR
60.0000 mg | Freq: Every day | INTRAMUSCULAR | Status: AC
  Administered 2016-02-03 – 2016-02-04 (×2): 60 mg via INTRAVENOUS
  Filled 2016-02-03 (×2): qty 2

## 2016-02-03 NOTE — Progress Notes (Addendum)
[facility] Progress Note   Subjective: congeted, coughing up phlegm, dropped sat's overnight repeatedly; no n/v.  Tired.    Vitals:   02/02/16 1800 02/02/16 2027 02/02/16 2107 02/03/16 0416  BP: (!) 110/55  (!) 97/51 (!) 98/50  Pulse: 61  (!) 56 (!) 57  Resp:   18 18  Temp:   98.2 F (36.8 C) 98.3 F (36.8 C)  TempSrc:   Oral Oral  SpO2: 96% 96% 99% 100%  Weight:    126.8 kg (279 lb 8 oz)  Height:        Inpatient medications: . aspirin EC  81 mg Oral Daily  . cefTRIAXone (ROCEPHIN)  IV  1 g Intravenous Q24H  . cinacalcet  90 mg Oral Q breakfast  . heparin  5,000 Units Subcutaneous Q8H  . insulin aspart  0-9 Units Subcutaneous TID WC  . insulin glargine  5 Units Subcutaneous QHS  . ipratropium-albuterol  3 mL Nebulization TID  . midodrine  10 mg Oral TID  . multivitamin with minerals  1 tablet Oral Daily  . sevelamer carbonate  4,000 mg Oral BID WC  . sodium chloride flush  3 mL Intravenous Q12H    sodium chloride, acetaminophen, nitroGLYCERIN, ondansetron (ZOFRAN) IV, sodium chloride flush  Exam: Gen appears uncomfortable, inc'd resp effort, tired No rash, cyanosis or gangrene Sclera anicteric, throat clear  No jvd or bruits Chest bilat diffuse coarse exp wheezing and rhonchi RRR no MRG Abd soft ntnd no mass or ascites +bs obese Ext some mild pitting edema R pretib, none on LLE, no hip edema or UE edema Neuro is alert, Ox 3 , nf   CXR 1/10 - bilat vasc congestsion and IS edema CXR 1/13 - no acute disease ECHO Nov '16 - normal LVEF, aortic sclerosis, mild MS  D[facility]: [facility]    4.5h   130kg   2/2 bath   Hep 13000 (13K)   LUA AVF  - Calcitriol 2.5 ug  - Mircera 100 ug q 2  Assessment: 1. Dyspnea - treated w HD x 3, 6.5kg removed and wt's down 4 kg from admission. However today looks and sounds a lot worse, O2 sats dropping.  Have d/w primary MD. Repeat CXR shows vasc congestion. Prob bronchitis. May have element of vol excess still. Has  chronically low BP's so hypotension is not necessarily an indication that we are at a new dry wt.  2. Fevers - better now.  Blood cx's were negative. On rocephin IV.  3. ESRD HD TTS 4. Hypotension - dc'd coreg and Imdur, ^'d midodrine 10 tid. BP's somewhat better.  5. IDDM    Plan - extra HD Monday. Cont to lower edw as tol.    Kelly Splinter MD [facility] pager [phone]   02/03/2016, 8:40 AM    Recent Labs Lab 02/01/16 0525 02/02/16 0611 02/02/16 0744  NA 136 132* 134*  K 5.0 5.5* 5.5*  CL 97* 95* 95*  CO2 28 27 27   GLUCOSE 91 72 75  BUN 31* 56* 55*  CREATININE 6.88* 9.77* 9.95*  CALCIUM 8.9 8.2* 8.3*  PHOS  --   --  5.0*    Recent Labs Lab 01/30/16 1848 02/02/16 0744  AST 16  --   ALT 11*  --   ALKPHOS 73  --   BILITOT 0.2*  --   PROT 7.1  --   ALBUMIN 3.0* 2.7*    Recent Labs Lab 01/30/16 1848 01/31/16 1100 02/02/16 0745  WBC  6.0 4.2 5.7  NEUTROABS 3.8  --   --   HGB 8.7* 7.8* 7.6*  HCT 27.8* 24.4* 24.0*  MCV 95.9 94.9 92.7  PLT 185 155 170   Iron/TIBC/Ferritin/ %Sat    Component Value Date/Time   IRON 56 12/17/2011 1000   TIBC 191 (L) 12/17/2011 1000   FERRITIN 1,014 (H) 12/17/2011 1000   IRONPCTSAT 29 12/17/2011 1000

## 2016-02-03 NOTE — Progress Notes (Signed)
Ref: [name], MD   Subjective:  Resting comfortably post breathing treatment. Afebrile. O2 sat less than 89 % at room air.  Objective:  Vital Signs in the last 24 hours: Temp:  [98.2 F (36.8 C)-98.4 F (36.9 C)] 98.4 F (36.9 C) (01/14 1213) Pulse Rate:  [56-66] 66 (01/14 1213) Cardiac Rhythm: Normal sinus rhythm (01/14 0700) Resp:  [18-20] 20 (01/14 1213) BP: (97-135)/(50-57) 135/57 (01/14 1213) SpO2:  [87 %-100 %] 99 % (01/14 1426) Weight:  [126.8 kg (279 lb 8 oz)] 126.8 kg (279 lb 8 oz) (01/14 0416)  Physical Exam: BP Readings from Last 1 Encounters:  02/03/16 (!) 135/57    Wt Readings from Last 1 Encounters:  02/03/16 126.8 kg (279 lb 8 oz)    Weight change: 0.042 kg (1.5 oz) Body mass index is 46.51 kg/m. HEENT: [place]/AT, Eyes-Brown, PERL, EOMI, Conjunctiva-Pink, Sclera-Non-icteric Neck: No JVD, No bruit, Trachea midline. Lungs:  Coarse crackles, Bilateral. Cardiac:  Regular rhythm, normal S1 and S2, no S3. II/VI systolic murmur. Abdomen:  Soft, non-tender. BS present. Extremities:  No edema present. No cyanosis. No clubbing. Left upper arm AVF. CNS: AxOx3, Cranial nerves grossly intact, moves all 4 extremities.  Skin: Warm and dry.   Intake/Output from previous day: 01/13 0701 - 01/14 0700 In: 660 [P.O.:660] Out: 3000     Lab Results: BMET    Component Value Date/Time   NA 134 (L) 02/03/2016 0905   NA 134 (L) 02/02/2016 0744   NA 132 (L) 02/02/2016 0611   K 4.3 02/03/2016 0905   K 5.5 (H) 02/02/2016 0744   K 5.5 (H) 02/02/2016 0611   CL 95 (L) 02/03/2016 0905   CL 95 (L) 02/02/2016 0744   CL 95 (L) 02/02/2016 0611   CO2 28 02/03/2016 0905   CO2 27 02/02/2016 0744   CO2 27 02/02/2016 0611   GLUCOSE 126 (H) 02/03/2016 0905   GLUCOSE 75 02/02/2016 0744   GLUCOSE 72 02/02/2016 0611   BUN 36 (H) 02/03/2016 0905   BUN 55 (H) 02/02/2016 0744   BUN 56 (H) 02/02/2016 0611   CREATININE 7.15 (H) 02/03/2016 0905   CREATININE 9.95 (H) 02/02/2016 0744    CREATININE 9.77 (H) 02/02/2016 0611   CREATININE 2.86 (H) 10/10/2010 1140   CREATININE 2.20 (H) 06/26/2010 1637   CALCIUM 8.8 (L) 02/03/2016 0905   CALCIUM 8.3 (L) 02/02/2016 0744   CALCIUM 8.2 (L) 02/02/2016 0611   CALCIUM 8.7 01/08/2010 1121   CALCIUM 9.1 10/08/2009 0922   CALCIUM 8.6 07/30/2009 1102   GFRNONAA 5 (L) 02/03/2016 0905   GFRNONAA 3 (L) 02/02/2016 0744   GFRNONAA 4 (L) 02/02/2016 0611   GFRAA 6 (L) 02/03/2016 0905   GFRAA 4 (L) 02/02/2016 0744   GFRAA 4 (L) 02/02/2016 0611   CBC    Component Value Date/Time   WBC 4.6 02/03/2016 0905   RBC 2.86 (L) 02/03/2016 0905   HGB 8.4 (L) 02/03/2016 0905   HCT 26.7 (L) 02/03/2016 0905   PLT 171 02/03/2016 0905   MCV 93.4 02/03/2016 0905   MCH 29.4 02/03/2016 0905   MCHC 31.5 02/03/2016 0905   RDW 15.8 (H) 02/03/2016 0905   LYMPHSABS 1.3 01/30/2016 1848   MONOABS 0.6 01/30/2016 1848   EOSABS 0.4 01/30/2016 1848   BASOSABS 0.0 01/30/2016 1848   HEPATIC Function Panel  Recent Labs  01/30/16 1848  PROT 7.1   HEMOGLOBIN A1C No components found for: HGA1C,  MPG CARDIAC ENZYMES Lab Results  Component Value Date  CKTOTAL 87 04/13/2010   CKMB 1.0 04/13/2010   TROPONINI 0.03 (HH) 01/31/2016   TROPONINI 0.03 (HH) 01/30/2016   TROPONINI 0.03 (HH) 01/30/2016   BNP No results for input(s): PROBNP in the last 8760 hours. TSH No results for input(s): TSH in the last 8760 hours. CHOLESTEROL No results for input(s): CHOL in the last 8760 hours.  Scheduled Meds: . aspirin EC  81 mg Oral Daily  . cefTRIAXone (ROCEPHIN)  IV  1 g Intravenous Q24H  . cinacalcet  90 mg Oral Q breakfast  . heparin  5,000 Units Subcutaneous Q8H  . insulin aspart  0-9 Units Subcutaneous TID WC  . insulin glargine  5 Units Subcutaneous QHS  . ipratropium-albuterol  3 mL Nebulization TID  . methylPREDNISolone (SOLU-MEDROL) injection  60 mg Intravenous Daily  . midodrine  10 mg Oral TID  . multivitamin with minerals  1 tablet Oral Daily   . sevelamer carbonate  4,000 mg Oral BID WC  . sodium chloride flush  3 mL Intravenous Q12H   Continuous Infusions: PRN Meds:.sodium chloride, acetaminophen, nitroGLYCERIN, ondansetron (ZOFRAN) IV, sodium chloride flush  Assessment/Plan: Acute bronchitis Acute left heart diastolic failure ESRD CAD COPD Type II DM Arthritis  Add IV solumedrol.   LOS: 4 days    Dixie Dials  MD  02/03/2016, 5:04 PM

## 2016-02-04 LAB — CBC
HCT: 25.4 % — ABNORMAL LOW (ref 36.0–46.0)
Hemoglobin: 8.3 g/dL — ABNORMAL LOW (ref 12.0–15.0)
MCH: 29.5 pg (ref 26.0–34.0)
MCHC: 32.7 g/dL (ref 30.0–36.0)
MCV: 90.4 fL (ref 78.0–100.0)
PLATELETS: 206 10*3/uL (ref 150–400)
RBC: 2.81 MIL/uL — AB (ref 3.87–5.11)
RDW: 15.5 % (ref 11.5–15.5)
WBC: 5.8 10*3/uL (ref 4.0–10.5)

## 2016-02-04 LAB — GLUCOSE, CAPILLARY
GLUCOSE-CAPILLARY: 170 mg/dL — AB (ref 65–99)
GLUCOSE-CAPILLARY: 138 mg/dL — AB (ref 65–99)
Glucose-Capillary: 185 mg/dL — ABNORMAL HIGH (ref 65–99)
Glucose-Capillary: 287 mg/dL — ABNORMAL HIGH (ref 65–99)

## 2016-02-04 LAB — RENAL FUNCTION PANEL
Albumin: 2.8 g/dL — ABNORMAL LOW (ref 3.5–5.0)
Anion gap: 15 (ref 5–15)
BUN: 56 mg/dL — AB (ref 6–20)
CO2: 25 mmol/L (ref 22–32)
CREATININE: 9.37 mg/dL — AB (ref 0.44–1.00)
Calcium: 7.8 mg/dL — ABNORMAL LOW (ref 8.9–10.3)
Chloride: 91 mmol/L — ABNORMAL LOW (ref 101–111)
GFR calc non Af Amer: 4 mL/min — ABNORMAL LOW (ref >60)
GFR, EST AFRICAN AMERICAN: 4 mL/min — AB (ref >60)
Glucose, Bld: 144 mg/dL — ABNORMAL HIGH (ref 65–99)
Phosphorus: 4.1 mg/dL (ref 2.5–4.6)
Potassium: 4.5 mmol/L (ref 3.5–5.1)
Sodium: 131 mmol/L — ABNORMAL LOW (ref 135–145)

## 2016-02-04 MED ORDER — LIDOCAINE-PRILOCAINE 2.5-2.5 % EX CREA
1.0000 "application " | TOPICAL_CREAM | CUTANEOUS | Status: DC | PRN
  Filled 2016-02-04: qty 5

## 2016-02-04 MED ORDER — ALTEPLASE 2 MG IJ SOLR
2.0000 mg | Freq: Once | INTRAMUSCULAR | Status: DC | PRN
Start: 2016-02-04 — End: 2016-02-04

## 2016-02-04 MED ORDER — IPRATROPIUM-ALBUTEROL 0.5-2.5 (3) MG/3ML IN SOLN
3.0000 mL | Freq: Two times a day (BID) | RESPIRATORY_TRACT | Status: DC
  Administered 2016-02-04 – 2016-02-12 (×14): 3 mL via RESPIRATORY_TRACT
  Filled 2016-02-04 (×17): qty 3

## 2016-02-04 MED ORDER — MIDODRINE HCL 5 MG PO TABS
ORAL_TABLET | ORAL | Status: AC
  Administered 2016-02-04: 19:00:00
  Filled 2016-02-04: qty 1

## 2016-02-04 MED ORDER — MIDODRINE HCL 5 MG PO TABS
5.0000 mg | ORAL_TABLET | Freq: Three times a day (TID) | ORAL | Status: DC
  Administered 2016-02-04 – 2016-02-05 (×5): 5 mg via ORAL
  Filled 2016-02-04 (×5): qty 1

## 2016-02-04 MED ORDER — SODIUM CHLORIDE 0.9 % IV SOLN: 100.0000 mL | INTRAVENOUS | Status: DC | PRN

## 2016-02-04 MED ORDER — LIDOCAINE-PRILOCAINE 2.5-2.5 % EX CREA: 1.0000 "application " | TOPICAL_CREAM | CUTANEOUS | Status: DC | PRN

## 2016-02-04 MED ORDER — PENTAFLUOROPROP-TETRAFLUOROETH EX AERO: 1.0000 "application " | INHALATION_SPRAY | CUTANEOUS | Status: DC | PRN

## 2016-02-04 MED ORDER — ALTEPLASE 2 MG IJ SOLR: 2.0000 mg | Freq: Once | INTRAMUSCULAR | Status: DC | PRN

## 2016-02-04 MED ORDER — HEPARIN SODIUM (PORCINE) 1000 UNIT/ML DIALYSIS: 1000.0000 [IU] | INTRAMUSCULAR | Status: DC | PRN

## 2016-02-04 MED ORDER — LIDOCAINE HCL (PF) 1 % IJ SOLN: 5.0000 mL | INTRAMUSCULAR | Status: DC | PRN

## 2016-02-04 MED ORDER — HEPARIN SODIUM (PORCINE) 1000 UNIT/ML DIALYSIS
1000.0000 [IU] | INTRAMUSCULAR | Status: DC | PRN
  Filled 2016-02-04: qty 1

## 2016-02-04 MED ORDER — HEPARIN SODIUM (PORCINE) 1000 UNIT/ML DIALYSIS
5000.0000 [IU] | INTRAMUSCULAR | Status: DC | PRN
  Filled 2016-02-04: qty 5

## 2016-02-04 NOTE — Progress Notes (Signed)
[facility] ROUNDING NOTE   Subjective:   Interval History:  Still appears dyspneic   Objective:  Vital signs in last 24 hours:  Temp:  [97.8 F (36.6 C)-98.1 F (36.7 C)] 97.8 F (36.6 C) (01/15 1210) Pulse Rate:  [61-66] 66 (01/15 1210) Resp:  [18-20] 18 (01/15 1210) BP: (119-139)/(54-58) 122/56 (01/15 1210) SpO2:  [97 %-100 %] 97 % (01/15 1210) Weight:  [127.8 kg (281 lb 12.8 oz)] 127.8 kg (281 lb 12.8 oz) (01/15 0500)  Weight change: -1.176 kg (-2 lb 9.5 oz) Filed Weights   02/02/16 1120 02/03/16 0416 02/04/16 0500  Weight: 126 kg (277 lb 12.5 oz) 126.8 kg (279 lb 8 oz) 127.8 kg (281 lb 12.8 oz)    Intake/Output: I/O last 3 completed shifts: In: 1093 [P.O.:1375; I.V.:30; IV Piggyback:150] Out: 0    Intake/Output this shift:  Total I/O In: 34 [P.O.:590] Out: 0   CVS- RRR RS- CTA diminished at bases  ABD- BS present soft non-distended EXT- mild edema     Basic Metabolic Panel:  Recent Labs Lab 01/31/16 0506 02/01/16 0525 02/02/16 0611 02/02/16 0744 02/03/16 0905  NA 139 136 132* 134* 134*  K 5.6* 5.0 5.5* 5.5* 4.3  CL 97* 97* 95* 95* 95*  CO2 29 28 27 27 28   GLUCOSE 85 91 72 75 126*  BUN 51* 31* 56* 55* 36*  CREATININE 8.66* 6.88* 9.77* 9.95* 7.15*  CALCIUM 9.0 8.9 8.2* 8.3* 8.8*  PHOS  --   --   --  5.0* 5.2*    Liver Function Tests:  Recent Labs Lab 01/30/16 1848 02/02/16 0744 02/03/16 0905  AST 16  --   --   ALT 11*  --   --   ALKPHOS 73  --   --   BILITOT 0.2*  --   --   PROT 7.1  --   --   ALBUMIN 3.0* 2.7* 2.9*   No results for input(s): LIPASE, AMYLASE in the last 168 hours. No results for input(s): AMMONIA in the last 168 hours.  CBC:  Recent Labs Lab 01/30/16 1848 01/31/16 1100 02/02/16 0745 02/03/16 0905  WBC 6.0 4.2 5.7 4.6  NEUTROABS 3.8  --   --   --   HGB 8.7* 7.8* 7.6* 8.4*  HCT 27.8* 24.4* 24.0* 26.7*  MCV 95.9 94.9 92.7 93.4  PLT 185 155 170 171    Cardiac Enzymes:  Recent Labs Lab  01/30/16 1848 01/30/16 2316 01/31/16 0506  TROPONINI 0.03* 0.03* 0.03*    BNP: Invalid input(s): POCBNP  CBG:  Recent Labs Lab 02/03/16 1137 02/03/16 1610 02/03/16 2207 02/04/16 0657 02/04/16 1150  GLUCAP 89 75 183* 185* 170*    Microbiology: Results for orders placed or performed during the hospital encounter of 01/30/16  MRSA PCR Screening     Status: None   Collection Time: 01/31/16  7:17 AM  Result Value Ref Range Status   MRSA by PCR NEGATIVE NEGATIVE Final    Comment:        The GeneXpert MRSA Assay (FDA approved for NASAL specimens only), is one component of a comprehensive MRSA colonization surveillance program. It is not intended to diagnose MRSA infection nor to guide or monitor treatment for MRSA infections.   Culture, blood (routine x 2)     Status: None (Preliminary result)   Collection Time: 02/01/16  8:34 AM  Result Value Ref Range Status   Specimen Description BLOOD RIGHT HAND  Final   Special Requests BOTTLES DRAWN AEROBIC  AND ANAEROBIC  5CC  Final   Culture NO GROWTH 3 DAYS  Final   Report Status PENDING  Incomplete  Culture, blood (routine x 2)     Status: None (Preliminary result)   Collection Time: 02/01/16  8:39 AM  Result Value Ref Range Status   Specimen Description BLOOD RIGHT ANTECUBITAL  Final   Special Requests BOTTLES DRAWN AEROBIC AND ANAEROBIC 5CC  Final   Culture NO GROWTH 3 DAYS  Final   Report Status PENDING  Incomplete    Coagulation Studies: No results for input(s): LABPROT, INR in the last 72 hours.  Urinalysis: No results for input(s): COLORURINE, LABSPEC, PHURINE, GLUCOSEU, HGBUR, BILIRUBINUR, KETONESUR, PROTEINUR, UROBILINOGEN, NITRITE, LEUKOCYTESUR in the last 72 hours.  Invalid input(s): APPERANCEUR    Imaging: Dg Chest Port 1 View  Result Date: 02/03/2016 CLINICAL DATA:  Chest bilat diffuse coarse exp wheezing and rhonchi, stats dropped repeatedly overnight. Pt coughing up phlegm and SOB. EXAM: PORTABLE  CHEST 1 VIEW COMPARISON:  02/02/2016 FINDINGS: Patient rotated left. Moderate cardiomegaly. Mildly degraded exam due to AP portable technique and patient body habitus. Apical lordotic positioning. Cannot exclude small left pleural effusion. No pneumothorax. Mild pulmonary venous congestion, accentuated by low lung volumes. Left lung base not well evaluated due to overlying soft tissues. Mild right base atelectasis. IMPRESSION: Decreased sensitivity and specificity exam due to technique related factors, as described above. Decreased aeration with diminished lung volumes. Probable bibasilar airspace disease. Favor atelectasis on the right. Cardiomegaly with development of mild pulmonary venous congestion. Possible small left pleural effusion. Electronically Signed   By: Abigail Miyamoto M.D.   On: 02/03/2016 09:40     Medications:    . aspirin EC  81 mg Oral Daily  . cefTRIAXone (ROCEPHIN)  IV  1 g Intravenous Q24H  . cinacalcet  90 mg Oral Q breakfast  . heparin  5,000 Units Subcutaneous Q8H  . insulin aspart  0-9 Units Subcutaneous TID WC  . insulin glargine  5 Units Subcutaneous QHS  . ipratropium-albuterol  3 mL Nebulization BID  . methylPREDNISolone (SOLU-MEDROL) injection  60 mg Intravenous Daily  . midodrine  5 mg Oral TID WC  . multivitamin with minerals  1 tablet Oral Daily  . sevelamer carbonate  4,000 mg Oral BID WC  . sodium chloride flush  3 mL Intravenous Q12H   sodium chloride, sodium chloride, sodium chloride, acetaminophen, alteplase, heparin, [START ON 02/05/2016] heparin, lidocaine (PF), lidocaine-prilocaine, nitroGLYCERIN, ondansetron (ZOFRAN) IV, pentafluoroprop-tetrafluoroeth, sodium chloride flush  Assessment/ Plan:  1. Dyspnea - treated w HD x 3, 6.5kg removed and wt's down 4 kg from admission. However today looks and sounds a lot worse, O2 sats dropping.  Have d/w primary MD. Repeat CXR shows vasc congestion. Prob bronchitis. May have element of vol excess still. Has  chronically low BP's so hypotension is not necessarily an indication that we are at a new dry wt.  Extra dialysis today  2. Fevers - better now.  Blood cx's were negative. On rocephin IV.  3. ESRD HD TTS 4. Hypotension - dc'd coreg and Imdur, ^'d midodrine 10 tid. BP's somewhat better.  5. IDDM        LOS: 5 [name] W @TODAY @1 :23 PM

## 2016-02-04 NOTE — Care Management Important Message (Signed)
Important Message  Patient Details  Name: [name] MRN: [id number] Date of Birth: Sep 06, 1945   Medicare Important Message Given:  Yes    Orbie Pyo 02/04/2016, 4:18 PM

## 2016-02-04 NOTE — Progress Notes (Signed)
Patient had no significant changes overnight. No complaints of pain. Vitals stable. Patient currently in bed asleep.

## 2016-02-04 NOTE — Progress Notes (Signed)
Ref: [name], MD   Subjective:  Cough and shortness of breath persist.   Objective:  Vital Signs in the last 24 hours: Temp:  [98 F (36.7 C)-98.4 F (36.9 C)] 98 F (36.7 C) (01/15 0500) Pulse Rate:  [61-66] 63 (01/15 0500) Cardiac Rhythm: Normal sinus rhythm (01/15 0700) Resp:  [19-20] 20 (01/15 0500) BP: (119-139)/(54-58) 139/54 (01/15 0500) SpO2:  [94 %-100 %] 98 % (01/15 0835) Weight:  [127.8 kg (281 lb 12.8 oz)] 127.8 kg (281 lb 12.8 oz) (01/15 0500)  Physical Exam: BP Readings from Last 1 Encounters:  02/04/16 (!) 139/54    Wt Readings from Last 1 Encounters:  02/04/16 127.8 kg (281 lb 12.8 oz)    Weight change: -1.176 kg (-2 lb 9.5 oz) Body mass index is 46.89 kg/m. HEENT: [place]/AT, Eyes-Brown, PERL, EOMI, Conjunctiva-Pale, Sclera-Non-icteric Neck: No JVD, No bruit, Trachea midline. Lungs:  Rhonchi, Bilateral. Cardiac:  Regular rhythm, normal S1 and S2, no S3. II/VI systolic murmur. Abdomen:  Soft, non-tender. BS present. Extremities:  No edema present. No cyanosis. No clubbing. AVF in left upper arm. CNS: AxOx3, Cranial nerves grossly intact, moves all 4 extremities.  Skin: Warm and dry.   Intake/Output from previous day: 01/14 0701 - 01/15 0700 In: 1075 [P.O.:895; I.V.:30; IV Piggyback:150] Out: 0     Lab Results: BMET    Component Value Date/Time   NA 134 (L) 02/03/2016 0905   NA 134 (L) 02/02/2016 0744   NA 132 (L) 02/02/2016 0611   K 4.3 02/03/2016 0905   K 5.5 (H) 02/02/2016 0744   K 5.5 (H) 02/02/2016 0611   CL 95 (L) 02/03/2016 0905   CL 95 (L) 02/02/2016 0744   CL 95 (L) 02/02/2016 0611   CO2 28 02/03/2016 0905   CO2 27 02/02/2016 0744   CO2 27 02/02/2016 0611   GLUCOSE 126 (H) 02/03/2016 0905   GLUCOSE 75 02/02/2016 0744   GLUCOSE 72 02/02/2016 0611   BUN 36 (H) 02/03/2016 0905   BUN 55 (H) 02/02/2016 0744   BUN 56 (H) 02/02/2016 0611   CREATININE 7.15 (H) 02/03/2016 0905   CREATININE 9.95 (H) 02/02/2016 0744   CREATININE 9.77  (H) 02/02/2016 0611   CREATININE 2.86 (H) 10/10/2010 1140   CREATININE 2.20 (H) 06/26/2010 1637   CALCIUM 8.8 (L) 02/03/2016 0905   CALCIUM 8.3 (L) 02/02/2016 0744   CALCIUM 8.2 (L) 02/02/2016 0611   CALCIUM 8.7 01/08/2010 1121   CALCIUM 9.1 10/08/2009 0922   CALCIUM 8.6 07/30/2009 1102   GFRNONAA 5 (L) 02/03/2016 0905   GFRNONAA 3 (L) 02/02/2016 0744   GFRNONAA 4 (L) 02/02/2016 0611   GFRAA 6 (L) 02/03/2016 0905   GFRAA 4 (L) 02/02/2016 0744   GFRAA 4 (L) 02/02/2016 0611   CBC    Component Value Date/Time   WBC 4.6 02/03/2016 0905   RBC 2.86 (L) 02/03/2016 0905   HGB 8.4 (L) 02/03/2016 0905   HCT 26.7 (L) 02/03/2016 0905   PLT 171 02/03/2016 0905   MCV 93.4 02/03/2016 0905   MCH 29.4 02/03/2016 0905   MCHC 31.5 02/03/2016 0905   RDW 15.8 (H) 02/03/2016 0905   LYMPHSABS 1.3 01/30/2016 1848   MONOABS 0.6 01/30/2016 1848   EOSABS 0.4 01/30/2016 1848   BASOSABS 0.0 01/30/2016 1848   HEPATIC Function Panel  Recent Labs  01/30/16 1848  PROT 7.1   HEMOGLOBIN A1C No components found for: HGA1C,  MPG CARDIAC ENZYMES Lab Results  Component Value Date   CKTOTAL 29  04/13/2010   CKMB 1.0 04/13/2010   TROPONINI 0.03 (HH) 01/31/2016   TROPONINI 0.03 (HH) 01/30/2016   TROPONINI 0.03 (HH) 01/30/2016   BNP No results for input(s): PROBNP in the last 8760 hours. TSH No results for input(s): TSH in the last 8760 hours. CHOLESTEROL No results for input(s): CHOL in the last 8760 hours.  Scheduled Meds: . aspirin EC  81 mg Oral Daily  . cefTRIAXone (ROCEPHIN)  IV  1 g Intravenous Q24H  . cinacalcet  90 mg Oral Q breakfast  . heparin  5,000 Units Subcutaneous Q8H  . insulin aspart  0-9 Units Subcutaneous TID WC  . insulin glargine  5 Units Subcutaneous QHS  . ipratropium-albuterol  3 mL Nebulization BID  . methylPREDNISolone (SOLU-MEDROL) injection  60 mg Intravenous Daily  . midodrine  5 mg Oral TID WC  . multivitamin with minerals  1 tablet Oral Daily  . sevelamer  carbonate  4,000 mg Oral BID WC  . sodium chloride flush  3 mL Intravenous Q12H   Continuous Infusions: PRN Meds:.sodium chloride, sodium chloride, sodium chloride, acetaminophen, alteplase, heparin, [START ON 02/05/2016] heparin, lidocaine (PF), lidocaine-prilocaine, nitroGLYCERIN, ondansetron (ZOFRAN) IV, pentafluoroprop-tetrafluoroeth, sodium chloride flush  Assessment/Plan: Acute bronchitis Acute left heart diastolic failure ESRD CAD COPD Type II DM Arthritis  Continue antibiotic and breathing treatment + solumedrol. Increase activity as tolerated. Incentive spirometry.   LOS: 5 days    Dixie Dials  MD  02/04/2016, 9:27 AM

## 2016-02-05 LAB — GLUCOSE, CAPILLARY
GLUCOSE-CAPILLARY: 316 mg/dL — AB (ref 65–99)
GLUCOSE-CAPILLARY: 113 mg/dL — AB (ref 65–99)
GLUCOSE-CAPILLARY: 232 mg/dL — AB (ref 65–99)
Glucose-Capillary: 194 mg/dL — ABNORMAL HIGH (ref 65–99)

## 2016-02-05 LAB — RENAL FUNCTION PANEL
ALBUMIN: 3.6 g/dL (ref 3.5–5.0)
Anion gap: 16 — ABNORMAL HIGH (ref 5–15)
BUN: 12 mg/dL (ref 6–20)
CALCIUM: 8.9 mg/dL (ref 8.9–10.3)
CO2: 25 mmol/L (ref 22–32)
CREATININE: 3.55 mg/dL — AB (ref 0.44–1.00)
Chloride: 94 mmol/L — ABNORMAL LOW (ref 101–111)
GFR calc non Af Amer: 12 mL/min — ABNORMAL LOW (ref >60)
GFR, EST AFRICAN AMERICAN: 14 mL/min — AB (ref >60)
GLUCOSE: 167 mg/dL — AB (ref 65–99)
PHOSPHORUS: 2.2 mg/dL — AB (ref 2.5–4.6)
Potassium: 3.5 mmol/L (ref 3.5–5.1)
Sodium: 135 mmol/L (ref 135–145)

## 2016-02-05 LAB — CBC
HCT: 30.3 % — ABNORMAL LOW (ref 36.0–46.0)
Hemoglobin: 9.4 g/dL — ABNORMAL LOW (ref 12.0–15.0)
MCH: 28.7 pg (ref 26.0–34.0)
MCHC: 31 g/dL (ref 30.0–36.0)
MCV: 92.4 fL (ref 78.0–100.0)
PLATELETS: 210 10*3/uL (ref 150–400)
RBC: 3.28 MIL/uL — AB (ref 3.87–5.11)
RDW: 16 % — ABNORMAL HIGH (ref 11.5–15.5)
WBC: 8.8 10*3/uL (ref 4.0–10.5)

## 2016-02-05 MED ORDER — PREDNISONE 20 MG PO TABS
30.0000 mg | ORAL_TABLET | Freq: Every day | ORAL | Status: DC
  Administered 2016-02-05: 30 mg via ORAL
  Filled 2016-02-05 (×3): qty 1

## 2016-02-05 MED ORDER — GUAIFENESIN-DM 100-10 MG/5ML PO SYRP
5.0000 mL | ORAL_SOLUTION | ORAL | Status: DC | PRN
Start: 2016-02-05 — End: 2016-02-12
  Administered 2016-02-05 – 2016-02-07 (×3): 5 mL via ORAL
  Filled 2016-02-05 (×3): qty 5

## 2016-02-05 NOTE — Progress Notes (Signed)
Ref: [name], MD   Subjective:  Feeling better. Cough continues.  Objective:  Vital Signs in the last 24 hours: Temp:  [97.7 F (36.5 C)-98 F (36.7 C)] 97.7 F (36.5 C) (01/16 0735) Pulse Rate:  [6-68] 55 (01/16 0930) Cardiac Rhythm: Normal sinus rhythm (01/15 1900) Resp:  [14-20] 15 (01/16 0800) BP: (91-173)/(38-86) 122/41 (01/16 0930) SpO2:  [97 %-100 %] 97 % (01/16 0519) Weight:  [125.7 kg (277 lb 1.6 oz)-132.4 kg (291 lb 14.2 oz)] 126.3 kg (278 lb 7.1 oz) (01/16 0735)  Physical Exam: BP Readings from Last 1 Encounters:  02/05/16 (!) 122/41    Wt Readings from Last 1 Encounters:  02/05/16 126.3 kg (278 lb 7.1 oz)    Weight change: 4.576 kg (10 lb 1.4 oz) Body mass index is 46.34 kg/m. HEENT: [place]/AT, Eyes-Brown, PERL, EOMI, Conjunctiva-Pale, Sclera-Non-icteric Neck: No JVD, No bruit, Trachea midline. Lungs:  Rhonchi persist, Bilateral. Cardiac:  Regular rhythm, normal S1 and S2, no S3. II/VI systolic murmur. Abdomen:  Soft, non-tender. BS present. Extremities:  No edema present. No cyanosis. No clubbing. AVF in left upper arm CNS: AxOx3, Cranial nerves grossly intact, moves all 4 extremities.  Skin: Warm and dry.   Intake/Output from previous day: 01/15 0701 - 01/16 0700 In: 710 [P.O.:710] Out: 3000     Lab Results: BMET    Component Value Date/Time   NA 131 (L) 02/04/2016 1330   NA 134 (L) 02/03/2016 0905   NA 134 (L) 02/02/2016 0744   K 4.5 02/04/2016 1330   K 4.3 02/03/2016 0905   K 5.5 (H) 02/02/2016 0744   CL 91 (L) 02/04/2016 1330   CL 95 (L) 02/03/2016 0905   CL 95 (L) 02/02/2016 0744   CO2 25 02/04/2016 1330   CO2 28 02/03/2016 0905   CO2 27 02/02/2016 0744   GLUCOSE 144 (H) 02/04/2016 1330   GLUCOSE 126 (H) 02/03/2016 0905   GLUCOSE 75 02/02/2016 0744   BUN 56 (H) 02/04/2016 1330   BUN 36 (H) 02/03/2016 0905   BUN 55 (H) 02/02/2016 0744   CREATININE 9.37 (H) 02/04/2016 1330   CREATININE 7.15 (H) 02/03/2016 0905   CREATININE 9.95  (H) 02/02/2016 0744   CREATININE 2.86 (H) 10/10/2010 1140   CREATININE 2.20 (H) 06/26/2010 1637   CALCIUM 7.8 (L) 02/04/2016 1330   CALCIUM 8.8 (L) 02/03/2016 0905   CALCIUM 8.3 (L) 02/02/2016 0744   CALCIUM 8.7 01/08/2010 1121   CALCIUM 9.1 10/08/2009 0922   CALCIUM 8.6 07/30/2009 1102   GFRNONAA 4 (L) 02/04/2016 1330   GFRNONAA 5 (L) 02/03/2016 0905   GFRNONAA 3 (L) 02/02/2016 0744   GFRAA 4 (L) 02/04/2016 1330   GFRAA 6 (L) 02/03/2016 0905   GFRAA 4 (L) 02/02/2016 0744   CBC    Component Value Date/Time   WBC 5.8 02/04/2016 1330   RBC 2.81 (L) 02/04/2016 1330   HGB 8.3 (L) 02/04/2016 1330   HCT 25.4 (L) 02/04/2016 1330   PLT 206 02/04/2016 1330   MCV 90.4 02/04/2016 1330   MCH 29.5 02/04/2016 1330   MCHC 32.7 02/04/2016 1330   RDW 15.5 02/04/2016 1330   LYMPHSABS 1.3 01/30/2016 1848   MONOABS 0.6 01/30/2016 1848   EOSABS 0.4 01/30/2016 1848   BASOSABS 0.0 01/30/2016 1848   HEPATIC Function Panel  Recent Labs  01/30/16 1848  PROT 7.1   HEMOGLOBIN A1C No components found for: HGA1C,  MPG CARDIAC ENZYMES Lab Results  Component Value Date   CKTOTAL 87 04/13/2010  CKMB 1.0 04/13/2010   TROPONINI 0.03 (HH) 01/31/2016   TROPONINI 0.03 (HH) 01/30/2016   TROPONINI 0.03 (HH) 01/30/2016   BNP No results for input(s): PROBNP in the last 8760 hours. TSH No results for input(s): TSH in the last 8760 hours. CHOLESTEROL No results for input(s): CHOL in the last 8760 hours.  Scheduled Meds: . aspirin EC  81 mg Oral Daily  . cefTRIAXone (ROCEPHIN)  IV  1 g Intravenous Q24H  . cinacalcet  90 mg Oral Q breakfast  . heparin  5,000 Units Subcutaneous Q8H  . insulin aspart  0-9 Units Subcutaneous TID WC  . insulin glargine  5 Units Subcutaneous QHS  . ipratropium-albuterol  3 mL Nebulization BID  . midodrine  5 mg Oral TID WC  . multivitamin with minerals  1 tablet Oral Daily  . predniSONE  30 mg Oral QAC breakfast  . sevelamer carbonate  4,000 mg Oral BID WC  .  sodium chloride flush  3 mL Intravenous Q12H   Continuous Infusions: PRN Meds:.sodium chloride, acetaminophen, nitroGLYCERIN, ondansetron (ZOFRAN) IV, sodium chloride flush  Assessment/Plan: Acute bronchitis Acute left heart diastolic failure ESRD CAD COPD Type II DM Arthritis  Change to PO prednisone. Continue IV antibiotic.   LOS: 6 days    Dixie Dials  MD  02/05/2016, 10:14 AM

## 2016-02-05 NOTE — Progress Notes (Signed)
Dialysis treatment completed.  3500 mL ultrafiltrated and net fluid removal 3000 mL.    Patient status unchanged. Lung sounds clear to ausculation in all fields. Generalized edema. Cardiac: NSR.  Disconnected lines and removed needles.  Pressure held for 10 minutes and band aid/gauze dressing applied.  Report given to bedside RN, Gabriel Cirri.

## 2016-02-05 NOTE — Progress Notes (Signed)
1338 - CBG = 113.  No coverage needed.

## 2016-02-05 NOTE — Progress Notes (Signed)
Patient rested well overnight. No significant changes. No complaints of pain overnight. Patient gone to HD.

## 2016-02-05 NOTE — Procedures (Signed)
I have seen and examined this patient and agree with the plan of care   K 4.5   Alb 2.8   Ca 7.8  Phos 4.1    cinacalcet   Hb 8.3    BP 95/53 Goal 3500     Midodrine 5mg  TID   No edema       [name] W 02/05/2016, 9:19 AM

## 2016-02-06 ENCOUNTER — Other Ambulatory Visit: Payer: Commercial Managed Care - HMO

## 2016-02-06 LAB — CULTURE, BLOOD (ROUTINE X 2)
CULTURE: NO GROWTH
Culture: NO GROWTH

## 2016-02-06 LAB — GLUCOSE, CAPILLARY
GLUCOSE-CAPILLARY: 214 mg/dL — AB (ref 65–99)
Glucose-Capillary: 151 mg/dL — ABNORMAL HIGH (ref 65–99)
Glucose-Capillary: 182 mg/dL — ABNORMAL HIGH (ref 65–99)
Glucose-Capillary: 165 mg/dL — ABNORMAL HIGH (ref 65–99)

## 2016-02-06 MED ORDER — CINACALCET HCL 30 MG PO TABS
90.0000 mg | ORAL_TABLET | Freq: Every day | ORAL | Status: DC
  Administered 2016-02-06 – 2016-02-12 (×7): 90 mg via ORAL
  Filled 2016-02-06 (×7): qty 3

## 2016-02-06 MED ORDER — SODIUM CHLORIDE 0.9 % IV SOLN
62.5000 mg | INTRAVENOUS | Status: DC
  Administered 2016-02-07: 62.5 mg via INTRAVENOUS
  Filled 2016-02-06 (×2): qty 5

## 2016-02-06 MED ORDER — PREDNISONE 20 MG PO TABS
30.0000 mg | ORAL_TABLET | Freq: Every day | ORAL | Status: AC
  Administered 2016-02-06: 06:00:00 30 mg via ORAL
  Filled 2016-02-06: qty 1

## 2016-02-06 MED ORDER — MIDODRINE HCL 5 MG PO TABS
5.0000 mg | ORAL_TABLET | Freq: Three times a day (TID) | ORAL | Status: DC
  Administered 2016-02-06 – 2016-02-12 (×20): 5 mg via ORAL
  Filled 2016-02-06 (×19): qty 1

## 2016-02-06 MED ORDER — CALCITRIOL 0.5 MCG PO CAPS
2.5000 ug | ORAL_CAPSULE | ORAL | Status: DC
  Administered 2016-02-07 – 2016-02-12 (×3): 2.5 ug via ORAL
  Filled 2016-02-06: qty 5

## 2016-02-06 MED ORDER — SEVELAMER CARBONATE 800 MG PO TABS
800.0000 mg | ORAL_TABLET | Freq: Two times a day (BID) | ORAL | Status: DC
  Administered 2016-02-06 – 2016-02-12 (×9): 800 mg via ORAL
  Filled 2016-02-06 (×10): qty 1

## 2016-02-06 MED ORDER — PREDNISONE 20 MG PO TABS: 20.0000 mg | ORAL_TABLET | Freq: Every day | ORAL | Status: DC

## 2016-02-06 MED ORDER — DARBEPOETIN ALFA 60 MCG/0.3ML IJ SOSY
60.0000 ug | PREFILLED_SYRINGE | INTRAMUSCULAR | Status: DC
  Administered 2016-02-07: 60 ug via INTRAVENOUS

## 2016-02-06 NOTE — Progress Notes (Signed)
Ref: [name], MD   Subjective:  Breathing better but lung congestion persist.   Objective:  Vital Signs in the last 24 hours: Temp:  [98.1 F (36.7 C)-98.6 F (37 C)] 98.5 F (36.9 C) (01/17 2004) Pulse Rate:  [50-59] 59 (01/17 2004) Cardiac Rhythm: Sinus bradycardia (01/17 0700) Resp:  [19-20] 19 (01/17 2004) BP: (109-117)/(47-50) 117/50 (01/17 2004) SpO2:  [95 %-100 %] 99 % (01/17 2042) Weight:  [123.7 kg (272 lb 9.6 oz)] 123.7 kg (272 lb 9.6 oz) (01/17 0504)  Physical Exam: BP Readings from Last 1 Encounters:  02/06/16 (!) 117/50    Wt Readings from Last 1 Encounters:  02/06/16 123.7 kg (272 lb 9.6 oz)    Weight change: -6.1 kg (-13 lb 7.2 oz) Body mass index is 45.36 kg/m. HEENT: [place]/AT, Eyes-Brown, PERL, EOMI, Conjunctiva-Pink, Sclera-Non-icteric Neck: No JVD, No bruit, Trachea midline. Lungs:  Coarse crackles, Bilateral. Cardiac:  Regular rhythm, normal S1 and S2, no S3. II/VI systolic murmur. Abdomen:  Soft, non-tender. BS present. Extremities:  No edema present. No cyanosis. No clubbing. CNS: AxOx3, Cranial nerves grossly intact, moves all 4 extremities.  Skin: Warm and dry.   Intake/Output from previous day: 01/16 0701 - 01/17 0700 In: 580 [P.O.:480; IV Piggyback:100] Out: 3000     Lab Results: BMET    Component Value Date/Time   NA 135 02/05/2016 1416   NA 131 (L) 02/04/2016 1330   NA 134 (L) 02/03/2016 0905   K 3.5 02/05/2016 1416   K 4.5 02/04/2016 1330   K 4.3 02/03/2016 0905   CL 94 (L) 02/05/2016 1416   CL 91 (L) 02/04/2016 1330   CL 95 (L) 02/03/2016 0905   CO2 25 02/05/2016 1416   CO2 25 02/04/2016 1330   CO2 28 02/03/2016 0905   GLUCOSE 167 (H) 02/05/2016 1416   GLUCOSE 144 (H) 02/04/2016 1330   GLUCOSE 126 (H) 02/03/2016 0905   BUN 12 02/05/2016 1416   BUN 56 (H) 02/04/2016 1330   BUN 36 (H) 02/03/2016 0905   CREATININE 3.55 (H) 02/05/2016 1416   CREATININE 9.37 (H) 02/04/2016 1330   CREATININE 7.15 (H) 02/03/2016 0905   CREATININE 2.86 (H) 10/10/2010 1140   CREATININE 2.20 (H) 06/26/2010 1637   CALCIUM 8.9 02/05/2016 1416   CALCIUM 7.8 (L) 02/04/2016 1330   CALCIUM 8.8 (L) 02/03/2016 0905   CALCIUM 8.7 01/08/2010 1121   CALCIUM 9.1 10/08/2009 0922   CALCIUM 8.6 07/30/2009 1102   GFRNONAA 12 (L) 02/05/2016 1416   GFRNONAA 4 (L) 02/04/2016 1330   GFRNONAA 5 (L) 02/03/2016 0905   GFRAA 14 (L) 02/05/2016 1416   GFRAA 4 (L) 02/04/2016 1330   GFRAA 6 (L) 02/03/2016 0905   CBC    Component Value Date/Time   WBC 8.8 02/05/2016 1416   RBC 3.28 (L) 02/05/2016 1416   HGB 9.4 (L) 02/05/2016 1416   HCT 30.3 (L) 02/05/2016 1416   PLT 210 02/05/2016 1416   MCV 92.4 02/05/2016 1416   MCH 28.7 02/05/2016 1416   MCHC 31.0 02/05/2016 1416   RDW 16.0 (H) 02/05/2016 1416   LYMPHSABS 1.3 01/30/2016 1848   MONOABS 0.6 01/30/2016 1848   EOSABS 0.4 01/30/2016 1848   BASOSABS 0.0 01/30/2016 1848   HEPATIC Function Panel  Recent Labs  01/30/16 1848  PROT 7.1   HEMOGLOBIN A1C No components found for: HGA1C,  MPG CARDIAC ENZYMES Lab Results  Component Value Date   CKTOTAL 87 04/13/2010   CKMB 1.0 04/13/2010   TROPONINI 0.03 (  HH) 01/31/2016   TROPONINI 0.03 (HH) 01/30/2016   TROPONINI 0.03 (HH) 01/30/2016   BNP No results for input(s): PROBNP in the last 8760 hours. TSH No results for input(s): TSH in the last 8760 hours. CHOLESTEROL No results for input(s): CHOL in the last 8760 hours.  Scheduled Meds: . aspirin EC  81 mg Oral Daily  . [START ON 02/07/2016] calcitRIOL  2.5 mcg Oral Q T,Th,Sa-HD  . cefTRIAXone (ROCEPHIN)  IV  1 g Intravenous Q24H  . cinacalcet  90 mg Oral Q breakfast  . [START ON 02/07/2016] darbepoetin (ARANESP) injection - DIALYSIS  60 mcg Intravenous Q Thu-HD  . [START ON 02/07/2016] ferric gluconate (FERRLECIT/NULECIT) IV  62.5 mg Intravenous Q Thu-HD  . heparin  5,000 Units Subcutaneous Q8H  . insulin aspart  0-9 Units Subcutaneous TID WC  . insulin glargine  5 Units  Subcutaneous QHS  . ipratropium-albuterol  3 mL Nebulization BID  . midodrine  5 mg Oral TID WC  . multivitamin with minerals  1 tablet Oral Daily  . sevelamer carbonate  800 mg Oral BID WC  . sodium chloride flush  3 mL Intravenous Q12H   Continuous Infusions: PRN Meds:.sodium chloride, acetaminophen, guaiFENesin-dextromethorphan, nitroGLYCERIN, ondansetron (ZOFRAN) IV, sodium chloride flush  Assessment/Plan: Acute bronchitis Acute left heart diastolic failure ESRD CAD COPD Type II DM Arthritis  Add Flutter valve to breathing treatments.   LOS: 7 days    Dixie Dials  MD  02/06/2016, 9:04 PM

## 2016-02-06 NOTE — Progress Notes (Signed)
Pt has ambulated down half of the hallway and back using a walker with family twice tonight.

## 2016-02-06 NOTE — Progress Notes (Signed)
Subjective:  Breathing better HD yesterday 3 liter uf volume / some coughing still   Objective Vital signs in last 24 hours: Vitals:   02/05/16 2024 02/05/16 2112 02/06/16 0504 02/06/16 0901  BP:  (!) 112/47    Pulse:  (!) 58 (!) 56   Resp:  20 20   Temp:  98.4 F (36.9 C) 98.1 F (36.7 C)   TempSrc:  Oral Oral   SpO2: 97% 100% 100% 98%  Weight:   123.7 kg (272 lb 9.6 oz)   Height:       Weight change: -6.1 kg (-13 lb 7.2 oz)  Physical Exam: General: alert  Obese AMF sitting in bed side chair/ NAD  occasional cough Heart: RRR , 2/6 sem  No rub  Lungs: bilat coarse rhonchi Abdomen: obese, soft , NT, ND Extremities: bipedal edema 1+ Dialysis Access: pos bruit    OP Dialysis:[facility]  4.5h 130kg 2/2 bath Hep 13000 (13K) LUA AVF - Calcitriol 2.5 ug - Mircera 100 ug q 2 last on 01/22/16   Venofer 50mg  q wkly              Problem/Plan:   1. Dyspnea sec volume overload / Bronchitis  - treated w HD Monday and yest / yest 3 l uf and below edw at 123.7 (edw 130 kg) O2 sat 98 %this am , HD  In am uf 3 to 4 liters as bp allows  2. Bronchitis / COPD- Fevers - afebrile  Past 48 hrs /better now. Blood cx's were negative. On rocephin IV. Nebs per admit  3. ESRD HD TTS, k 3.5 after hd past 2 days , fu labs pre hd tomor.  4. Hypotension - dc'd coreg and Imdur, ^'d midodrine 10 tid. BP's somewhat better.  5. Anemia  Of ESRD- hgb 9.4  no esa in hosp/last op 1/02 /18 op  Give  On hd  tomor Aranesp / give wkly Venofer on hD 6. MBD- phos 2.2 on renvela 4000 bid meals- decrease to 80mg  bid meals/ core ca 9.2 / on sensipar 90 mg q day/ needs Calcitriol po on hd  ( pth 14300 7. IDDM  Type 2 - per admit  8. Obesity    Ernest Haber, PA-C [facility] [phone] 02/06/2016,11:32 AM  LOS: 7 days   Labs: Basic Metabolic Panel:  Recent Labs Lab 02/03/16 0905 02/04/16 1330 02/05/16 1416  NA 134* 131* 135  K 4.3 4.5 3.5  CL 95* 91* 94*  CO2 28 25 25   GLUCOSE  126* 144* 167*  BUN 36* 56* 12  CREATININE 7.15* 9.37* 3.55*  CALCIUM 8.8* 7.8* 8.9  PHOS 5.2* 4.1 2.2*   Liver Function Tests:  Recent Labs Lab 01/30/16 1848  02/03/16 0905 02/04/16 1330 02/05/16 1416  AST 16  --   --   --   --   ALT 11*  --   --   --   --   ALKPHOS 73  --   --   --   --   BILITOT 0.2*  --   --   --   --   PROT 7.1  --   --   --   --   ALBUMIN 3.0*  < > 2.9* 2.8* 3.6  < > = values in this interval not displayed. No results for input(s): LIPASE, AMYLASE in the last 168 hours. No results for input(s): AMMONIA in the last 168 hours. CBC:  Recent Labs Lab 01/30/16 1848 01/31/16 1100  02/02/16 0745 02/03/16 0905 02/04/16 1330 02/05/16 1416  WBC 6.0 4.2 5.7 4.6 5.8 8.8  NEUTROABS 3.8  --   --   --   --   --   HGB 8.7* 7.8* 7.6* 8.4* 8.3* 9.4*  HCT 27.8* 24.4* 24.0* 26.7* 25.4* 30.3*  MCV 95.9 94.9 92.7 93.4 90.4 92.4  PLT 185 155 170 171 206 210   Cardiac Enzymes:  Recent Labs Lab 01/30/16 1848 01/30/16 2316 01/31/16 0506  TROPONINI 0.03* 0.03* 0.03*   CBG:  Recent Labs Lab 02/05/16 1337 02/05/16 1703 02/05/16 2109 02/06/16 0537 02/06/16 1118  GLUCAP 113* 232* 194* 151* 182*    Studies/Results: No results found. Medications:  . aspirin EC  81 mg Oral Daily  . cefTRIAXone (ROCEPHIN)  IV  1 g Intravenous Q24H  . cinacalcet  90 mg Oral Q breakfast  . heparin  5,000 Units Subcutaneous Q8H  . insulin aspart  0-9 Units Subcutaneous TID WC  . insulin glargine  5 Units Subcutaneous QHS  . ipratropium-albuterol  3 mL Nebulization BID  . midodrine  5 mg Oral TID WC  . multivitamin with minerals  1 tablet Oral Daily  . sevelamer carbonate  4,000 mg Oral BID WC  . sodium chloride flush  3 mL Intravenous Q12H

## 2016-02-07 LAB — RENAL FUNCTION PANEL
ANION GAP: 15 (ref 5–15)
Albumin: 3.2 g/dL — ABNORMAL LOW (ref 3.5–5.0)
BUN: 61 mg/dL — ABNORMAL HIGH (ref 6–20)
CHLORIDE: 96 mmol/L — AB (ref 101–111)
CO2: 25 mmol/L (ref 22–32)
CREATININE: 8.39 mg/dL — AB (ref 0.44–1.00)
Calcium: 8.3 mg/dL — ABNORMAL LOW (ref 8.9–10.3)
GFR, EST AFRICAN AMERICAN: 5 mL/min — AB (ref >60)
GFR, EST NON AFRICAN AMERICAN: 4 mL/min — AB (ref >60)
Glucose, Bld: 94 mg/dL (ref 65–99)
Phosphorus: 2.8 mg/dL (ref 2.5–4.6)
Potassium: 4 mmol/L (ref 3.5–5.1)
Sodium: 136 mmol/L (ref 135–145)

## 2016-02-07 LAB — CBC
HEMATOCRIT: 27.9 % — AB (ref 36.0–46.0)
HEMOGLOBIN: 8.8 g/dL — AB (ref 12.0–15.0)
MCH: 29.2 pg (ref 26.0–34.0)
MCHC: 31.5 g/dL (ref 30.0–36.0)
MCV: 92.7 fL (ref 78.0–100.0)
PLATELETS: 225 10*3/uL (ref 150–400)
RBC: 3.01 MIL/uL — AB (ref 3.87–5.11)
RDW: 16.4 % — ABNORMAL HIGH (ref 11.5–15.5)
WBC: 9.7 10*3/uL (ref 4.0–10.5)

## 2016-02-07 LAB — GLUCOSE, CAPILLARY
GLUCOSE-CAPILLARY: 201 mg/dL — AB (ref 65–99)
GLUCOSE-CAPILLARY: 200 mg/dL — AB (ref 65–99)
Glucose-Capillary: 102 mg/dL — ABNORMAL HIGH (ref 65–99)
Glucose-Capillary: 104 mg/dL — ABNORMAL HIGH (ref 65–99)

## 2016-02-07 LAB — RETICULOCYTES
RBC.: 3.21 MIL/uL — AB (ref 3.87–5.11)
RETIC COUNT ABSOLUTE: 99.5 10*3/uL (ref 19.0–186.0)
Retic Ct Pct: 3.1 % (ref 0.4–3.1)

## 2016-02-07 LAB — IRON AND TIBC
IRON: 185 ug/dL — AB (ref 28–170)
SATURATION RATIOS: 68 % — AB (ref 10.4–31.8)
TIBC: 272 ug/dL (ref 250–450)
UIBC: 87 ug/dL

## 2016-02-07 LAB — FERRITIN: Ferritin: 2583 ng/mL — ABNORMAL HIGH (ref 11–307)

## 2016-02-07 LAB — VITAMIN B12: VITAMIN B 12: 2040 pg/mL — AB (ref 180–914)

## 2016-02-07 LAB — FOLATE: FOLATE: 8.6 ng/mL (ref >5.9)

## 2016-02-07 MED ORDER — CALCITRIOL 0.25 MCG PO CAPS
ORAL_CAPSULE | ORAL | Status: AC
  Filled 2016-02-07: qty 2

## 2016-02-07 MED ORDER — CALCITRIOL 0.5 MCG PO CAPS
ORAL_CAPSULE | ORAL | Status: AC
  Filled 2016-02-07: qty 4

## 2016-02-07 MED ORDER — PREDNISONE 20 MG PO TABS
20.0000 mg | ORAL_TABLET | Freq: Every day | ORAL | Status: AC
  Administered 2016-02-07 – 2016-02-10 (×4): 20 mg via ORAL
  Filled 2016-02-07 (×4): qty 1

## 2016-02-07 MED ORDER — HEPARIN SODIUM (PORCINE) 1000 UNIT/ML IJ SOLN
2000.0000 [IU] | Freq: Once | INTRAMUSCULAR | Status: AC
Start: 2016-02-07 — End: 2016-02-07
  Administered 2016-02-07: 2000 [IU] via INTRAVENOUS
  Filled 2016-02-07: qty 2

## 2016-02-07 MED ORDER — MIDODRINE HCL 5 MG PO TABS
ORAL_TABLET | ORAL | Status: AC
  Filled 2016-02-07: qty 2

## 2016-02-07 MED ORDER — DARBEPOETIN ALFA 60 MCG/0.3ML IJ SOSY
PREFILLED_SYRINGE | INTRAMUSCULAR | Status: AC
  Filled 2016-02-07: qty 0.3

## 2016-02-07 NOTE — Progress Notes (Signed)
Ref: [name], MD   Subjective:  Resting comfortably. Periodic shortness of breath.   Objective:  Vital Signs in the last 24 hours: Temp:  [97.5 F (36.4 C)-98.2 F (36.8 C)] 97.5 F (36.4 C) (01/18 1237) Pulse Rate:  [57-69] 66 (01/18 1237) Cardiac Rhythm: Normal sinus rhythm (01/18 1900) Resp:  [12-22] 20 (01/18 1237) BP: (89-131)/(29-83) 131/83 (01/18 1237) SpO2:  [99 %-100 %] 100 % (01/18 1237) Weight:  [122.2 kg (269 lb 6.4 oz)-124.9 kg (275 lb 5.7 oz)] 122.2 kg (269 lb 6.4 oz) (01/18 1237)  Physical Exam: BP Readings from Last 1 Encounters:  02/07/16 131/83    Wt Readings from Last 1 Encounters:  02/07/16 122.2 kg (269 lb 6.4 oz)    Weight change: -1.652 kg (-3 lb 10.3 oz) Body mass index is 44.83 kg/m. HEENT: [place]/AT, Eyes-Brown, PERL, EOMI, Conjunctiva-Pale, Sclera-Non-icteric Neck: No JVD, No bruit, Trachea midline. Lungs:  Few crackles, Bilateral. Cardiac:  Regular rhythm, normal S1 and S2, no S3. II/VI systolic murmur. Abdomen:  Soft, non-tender. BS present. Extremities:  No edema present. No cyanosis. No clubbing. Left upper arm AVF. CNS: AxOx3, Cranial nerves grossly intact, moves all 4 extremities.  Skin: Warm and dry.   Intake/Output from previous day: 01/17 0701 - 01/18 0700 In: 1010 [P.O.:960; IV Piggyback:50] Out: 1 [Stool:1]    Lab Results: BMET    Component Value Date/Time   NA 136 02/07/2016 0800   NA 135 02/05/2016 1416   NA 131 (L) 02/04/2016 1330   K 4.0 02/07/2016 0800   K 3.5 02/05/2016 1416   K 4.5 02/04/2016 1330   CL 96 (L) 02/07/2016 0800   CL 94 (L) 02/05/2016 1416   CL 91 (L) 02/04/2016 1330   CO2 25 02/07/2016 0800   CO2 25 02/05/2016 1416   CO2 25 02/04/2016 1330   GLUCOSE 94 02/07/2016 0800   GLUCOSE 167 (H) 02/05/2016 1416   GLUCOSE 144 (H) 02/04/2016 1330   BUN 61 (H) 02/07/2016 0800   BUN 12 02/05/2016 1416   BUN 56 (H) 02/04/2016 1330   CREATININE 8.39 (H) 02/07/2016 0800   CREATININE 3.55 (H) 02/05/2016  1416   CREATININE 9.37 (H) 02/04/2016 1330   CREATININE 2.86 (H) 10/10/2010 1140   CREATININE 2.20 (H) 06/26/2010 1637   CALCIUM 8.3 (L) 02/07/2016 0800   CALCIUM 8.9 02/05/2016 1416   CALCIUM 7.8 (L) 02/04/2016 1330   CALCIUM 8.7 01/08/2010 1121   CALCIUM 9.1 10/08/2009 0922   CALCIUM 8.6 07/30/2009 1102   GFRNONAA 4 (L) 02/07/2016 0800   GFRNONAA 12 (L) 02/05/2016 1416   GFRNONAA 4 (L) 02/04/2016 1330   GFRAA 5 (L) 02/07/2016 0800   GFRAA 14 (L) 02/05/2016 1416   GFRAA 4 (L) 02/04/2016 1330   CBC    Component Value Date/Time   WBC 9.7 02/07/2016 0801   RBC 3.21 (L) 02/07/2016 1137   RBC 3.01 (L) 02/07/2016 0801   HGB 8.8 (L) 02/07/2016 0801   HCT 27.9 (L) 02/07/2016 0801   PLT 225 02/07/2016 0801   MCV 92.7 02/07/2016 0801   MCH 29.2 02/07/2016 0801   MCHC 31.5 02/07/2016 0801   RDW 16.4 (H) 02/07/2016 0801   LYMPHSABS 1.3 01/30/2016 1848   MONOABS 0.6 01/30/2016 1848   EOSABS 0.4 01/30/2016 1848   BASOSABS 0.0 01/30/2016 1848   HEPATIC Function Panel  Recent Labs  01/30/16 1848  PROT 7.1   HEMOGLOBIN A1C No components found for: HGA1C,  MPG CARDIAC ENZYMES Lab Results  Component Value  Date   CKTOTAL 87 04/13/2010   CKMB 1.0 04/13/2010   TROPONINI 0.03 (HH) 01/31/2016   TROPONINI 0.03 (HH) 01/30/2016   TROPONINI 0.03 (HH) 01/30/2016   BNP No results for input(s): PROBNP in the last 8760 hours. TSH No results for input(s): TSH in the last 8760 hours. CHOLESTEROL No results for input(s): CHOL in the last 8760 hours.  Scheduled Meds: . aspirin EC  81 mg Oral Daily  . calcitRIOL  2.5 mcg Oral Q T,Th,Sa-HD  . cefTRIAXone (ROCEPHIN)  IV  1 g Intravenous Q24H  . cinacalcet  90 mg Oral Q breakfast  . darbepoetin (ARANESP) injection - DIALYSIS  60 mcg Intravenous Q Thu-HD  . ferric gluconate (FERRLECIT/NULECIT) IV  62.5 mg Intravenous Q Thu-HD  . heparin  5,000 Units Subcutaneous Q8H  . insulin aspart  0-9 Units Subcutaneous TID WC  . insulin glargine  5  Units Subcutaneous QHS  . ipratropium-albuterol  3 mL Nebulization BID  . midodrine  5 mg Oral TID WC  . multivitamin with minerals  1 tablet Oral Daily  . predniSONE  20 mg Oral Q breakfast  . sevelamer carbonate  800 mg Oral BID WC  . sodium chloride flush  3 mL Intravenous Q12H   Continuous Infusions: PRN Meds:.sodium chloride, acetaminophen, guaiFENesin-dextromethorphan, nitroGLYCERIN, ondansetron (ZOFRAN) IV, sodium chloride flush  Assessment/Plan: Acute bronchitis Acute left heart diastolic failure ESRD CAD COPD Type II DM Arthritis  Continue antibiotics and prednisone with breathing treatments.   LOS: 8 days    Dixie Dials  MD  02/07/2016, 8:23 PM

## 2016-02-07 NOTE — Procedures (Signed)
I have seen and examined this patient and agree with the plan of care   BP 93/52   Goal 4 L      Midodrine 5 mg TID  Hb 8.8    Aranesp 60 mcg   Iron weekly   Ca  8.3  Phos 2.8   Alb 3.2    Calcitriol  Cinacalcet   Rocephin  Q 24hours        Faithanne Verret W 02/07/2016, 10:11 AM

## 2016-02-08 LAB — GLUCOSE, CAPILLARY
GLUCOSE-CAPILLARY: 147 mg/dL — AB (ref 65–99)
GLUCOSE-CAPILLARY: 198 mg/dL — AB (ref 65–99)
Glucose-Capillary: 158 mg/dL — ABNORMAL HIGH (ref 65–99)
Glucose-Capillary: 120 mg/dL — ABNORMAL HIGH (ref 65–99)

## 2016-02-08 LAB — HEMOGLOBIN A1C
HEMOGLOBIN A1C: 5.7 % — AB (ref 4.8–5.6)
MEAN PLASMA GLUCOSE: 117 mg/dL

## 2016-02-08 LAB — BRAIN NATRIURETIC PEPTIDE: B Natriuretic Peptide: 119.1 pg/mL — ABNORMAL HIGH (ref 0.0–100.0)

## 2016-02-08 LAB — TSH: TSH: 1.146 u[IU]/mL (ref 0.350–4.500)

## 2016-02-08 LAB — T4, FREE: FREE T4: 1.02 ng/dL (ref 0.61–1.12)

## 2016-02-08 MED ORDER — LEVOFLOXACIN 500 MG PO TABS
500.0000 mg | ORAL_TABLET | ORAL | Status: DC
Start: 2016-02-08 — End: 2016-02-12
  Administered 2016-02-08 – 2016-02-12 (×4): 500 mg via ORAL
  Filled 2016-02-08 (×4): qty 1

## 2016-02-08 NOTE — Progress Notes (Signed)
[facility] ROUNDING NOTE   Subjective:   Interval History: still coughing with some dyspnea   Objective:  Vital signs in last 24 hours:  Temp:  [97.5 F (36.4 C)-98.1 F (36.7 C)] 97.7 F (36.5 C) (01/19 0422) Pulse Rate:  [61-67] 67 (01/19 0422) Resp:  [18-22] 18 (01/19 0422) BP: (98-131)/(44-83) 100/53 (01/19 0422) SpO2:  [95 %-100 %] 98 % (01/19 0835) Weight:  [122.2 kg (269 lb 6.4 oz)-122.9 kg (271 lb)] 122.9 kg (271 lb) (01/19 0422)  Weight change: 0.251 kg (8.9 oz) Filed Weights   02/07/16 0745 02/07/16 1237 02/08/16 0422  Weight: 124.9 kg (275 lb 5.7 oz) 122.2 kg (269 lb 6.4 oz) 122.9 kg (271 lb)    Intake/Output: I/O last 3 completed shifts: In: 64 [P.O.:840; I.V.:6; IV Piggyback:50] Out: 2700 [Other:2700]   Intake/Output this shift:  Total I/O In: 600 [P.O.:600] Out: 0   General: alert  Obese AMF sitting in bed side chair/ NAD  occasional cough Heart: RRR , 2/6 sem  No rub  Lungs: bilat coarse rhonchi Abdomen: obese, soft , NT, ND Extremities: bipedal edema 1+ Dialysis Access: pos bruit    Basic Metabolic Panel:  Recent Labs Lab 02/02/16 0744 02/03/16 0905 02/04/16 1330 02/05/16 1416 02/07/16 0800  NA 134* 134* 131* 135 136  K 5.5* 4.3 4.5 3.5 4.0  CL 95* 95* 91* 94* 96*  CO2 27 28 25 25 25   GLUCOSE 75 126* 144* 167* 94  BUN 55* 36* 56* 12 61*  CREATININE 9.95* 7.15* 9.37* 3.55* 8.39*  CALCIUM 8.3* 8.8* 7.8* 8.9 8.3*  PHOS 5.0* 5.2* 4.1 2.2* 2.8    Liver Function Tests:  Recent Labs Lab 02/02/16 0744 02/03/16 0905 02/04/16 1330 02/05/16 1416 02/07/16 0800  ALBUMIN 2.7* 2.9* 2.8* 3.6 3.2*   No results for input(s): LIPASE, AMYLASE in the last 168 hours. No results for input(s): AMMONIA in the last 168 hours.  CBC:  Recent Labs Lab 02/02/16 0745 02/03/16 0905 02/04/16 1330 02/05/16 1416 02/07/16 0801  WBC 5.7 4.6 5.8 8.8 9.7  HGB 7.6* 8.4* 8.3* 9.4* 8.8*  HCT 24.0* 26.7* 25.4* 30.3* 27.9*  MCV 92.7 93.4  90.4 92.4 92.7  PLT 170 171 206 210 225    Cardiac Enzymes: No results for input(s): CKTOTAL, CKMB, CKMBINDEX, TROPONINI in the last 168 hours.  BNP: Invalid input(s): POCBNP  CBG:  Recent Labs Lab 02/07/16 0555 02/07/16 1334 02/07/16 1654 02/07/16 2147 02/08/16 0544  GLUCAP 102* 104* 201* 200* 158*    Microbiology: Results for orders placed or performed during the hospital encounter of 01/30/16  MRSA PCR Screening     Status: None   Collection Time: 01/31/16  7:17 AM  Result Value Ref Range Status   MRSA by PCR NEGATIVE NEGATIVE Final    Comment:        The GeneXpert MRSA Assay (FDA approved for NASAL specimens only), is one component of a comprehensive MRSA colonization surveillance program. It is not intended to diagnose MRSA infection nor to guide or monitor treatment for MRSA infections.   Culture, blood (routine x 2)     Status: None   Collection Time: 02/01/16  8:34 AM  Result Value Ref Range Status   Specimen Description BLOOD RIGHT HAND  Final   Special Requests BOTTLES DRAWN AEROBIC AND ANAEROBIC  5CC  Final   Culture NO GROWTH 5 DAYS  Final   Report Status 02/06/2016 FINAL  Final  Culture, blood (routine x 2)     Status:  None   Collection Time: 02/01/16  8:39 AM  Result Value Ref Range Status   Specimen Description BLOOD RIGHT ANTECUBITAL  Final   Special Requests BOTTLES DRAWN AEROBIC AND ANAEROBIC 5CC  Final   Culture NO GROWTH 5 DAYS  Final   Report Status 02/06/2016 FINAL  Final    Coagulation Studies: No results for input(s): LABPROT, INR in the last 72 hours.  Urinalysis: No results for input(s): COLORURINE, LABSPEC, PHURINE, GLUCOSEU, HGBUR, BILIRUBINUR, KETONESUR, PROTEINUR, UROBILINOGEN, NITRITE, LEUKOCYTESUR in the last 72 hours.  Invalid input(s): APPERANCEUR    Imaging: No results found.   Medications:    . aspirin EC  81 mg Oral Daily  . calcitRIOL  2.5 mcg Oral Q T,Th,Sa-HD  . cinacalcet  90 mg Oral Q breakfast  .  darbepoetin (ARANESP) injection - DIALYSIS  60 mcg Intravenous Q Thu-HD  . ferric gluconate (FERRLECIT/NULECIT) IV  62.5 mg Intravenous Q Thu-HD  . heparin  5,000 Units Subcutaneous Q8H  . insulin aspart  0-9 Units Subcutaneous TID WC  . insulin glargine  5 Units Subcutaneous QHS  . ipratropium-albuterol  3 mL Nebulization BID  . levofloxacin  500 mg Oral Q48H  . midodrine  5 mg Oral TID WC  . multivitamin with minerals  1 tablet Oral Daily  . predniSONE  20 mg Oral Q breakfast  . sevelamer carbonate  800 mg Oral BID WC  . sodium chloride flush  3 mL Intravenous Q12H   sodium chloride, acetaminophen, guaiFENesin-dextromethorphan, nitroGLYCERIN, ondansetron (ZOFRAN) IV, sodium chloride flush  Assessment/ Plan:  1. Dyspnea sec volume overload / Bronchitis  - treated w HD  Removal 2700 cc 1/18  2. Bronchitis / COPD- . On rocephin IV. Nebs per admit  3. ESRD HD TTS, 4. Hypotension - midodrine 5mg  TID  5. Anemia  Of ESRD- hgb 9.4   Aranesp / give wkly Venofer on hD 6. MBD- phos 2.2 on renvela 4000 bid meals- decrease to 80mg  bid meals/ core ca 9.2 / on sensipar 90 mg q day/ needs Calcitriol po on hd  7. IDDM  Type 2 - per admit  8. Obesity   LOS: 9 Anyia Gierke W @TODAY @11 :07 AM

## 2016-02-08 NOTE — Care Management Important Message (Signed)
Important Message  Patient Details  Name: [name] MRN: [id number] Date of Birth: 1945-08-15   Medicare Important Message Given:  Yes    Krishiv Sandler [name] 02/08/2016, 1:09 PM

## 2016-02-08 NOTE — Progress Notes (Signed)
Ref: [name], MD   Subjective:  Still coughing and bringing up lot of sputum. Vital signs are stable. Will need home oxygen and new CPAP machine.  Objective:  Vital Signs in the last 24 hours: Temp:  [97.5 F (36.4 C)-98.1 F (36.7 C)] 97.7 F (36.5 C) (01/19 0422) Pulse Rate:  [61-69] 67 (01/19 0422) Cardiac Rhythm: Normal sinus rhythm (01/19 0700) Resp:  [18-22] 18 (01/19 0422) BP: (90-131)/(29-83) 100/53 (01/19 0422) SpO2:  [95 %-100 %] 98 % (01/19 0835) Weight:  [122.2 kg (269 lb 6.4 oz)-122.9 kg (271 lb)] 122.9 kg (271 lb) (01/19 0422)  Physical Exam: BP Readings from Last 1 Encounters:  02/08/16 (!) 100/53    Wt Readings from Last 1 Encounters:  02/08/16 122.9 kg (271 lb)    Weight change: 0.251 kg (8.9 oz) Body mass index is 45.1 kg/m. HEENT: [place]/AT, Eyes-Brown and prominent eyes, PERL, EOMI, Conjunctiva-Pale, Sclera-Non-icteric Neck: No JVD, No bruit, Trachea midline. Lungs:  Rhonchi, Bilateral. Cardiac:  Regular rhythm, normal S1 and S2, no S3. II/VI systolic murmur. Abdomen:  Soft, non-tender. BS present. Extremities:  No edema present. No cyanosis. No clubbing. Left upper arm AVF. CNS: AxOx3, Cranial nerves grossly intact, moves all 4 extremities.  Skin: Warm and dry.   Intake/Output from previous day: 01/18 0701 - 01/19 0700 In: 43 [P.O.:840; I.V.:6] Out: 2700     Lab Results: BMET    Component Value Date/Time   NA 136 02/07/2016 0800   NA 135 02/05/2016 1416   NA 131 (L) 02/04/2016 1330   K 4.0 02/07/2016 0800   K 3.5 02/05/2016 1416   K 4.5 02/04/2016 1330   CL 96 (L) 02/07/2016 0800   CL 94 (L) 02/05/2016 1416   CL 91 (L) 02/04/2016 1330   CO2 25 02/07/2016 0800   CO2 25 02/05/2016 1416   CO2 25 02/04/2016 1330   GLUCOSE 94 02/07/2016 0800   GLUCOSE 167 (H) 02/05/2016 1416   GLUCOSE 144 (H) 02/04/2016 1330   BUN 61 (H) 02/07/2016 0800   BUN 12 02/05/2016 1416   BUN 56 (H) 02/04/2016 1330   CREATININE 8.39 (H) 02/07/2016 0800   CREATININE 3.55 (H) 02/05/2016 1416   CREATININE 9.37 (H) 02/04/2016 1330   CREATININE 2.86 (H) 10/10/2010 1140   CREATININE 2.20 (H) 06/26/2010 1637   CALCIUM 8.3 (L) 02/07/2016 0800   CALCIUM 8.9 02/05/2016 1416   CALCIUM 7.8 (L) 02/04/2016 1330   CALCIUM 8.7 01/08/2010 1121   CALCIUM 9.1 10/08/2009 0922   CALCIUM 8.6 07/30/2009 1102   GFRNONAA 4 (L) 02/07/2016 0800   GFRNONAA 12 (L) 02/05/2016 1416   GFRNONAA 4 (L) 02/04/2016 1330   GFRAA 5 (L) 02/07/2016 0800   GFRAA 14 (L) 02/05/2016 1416   GFRAA 4 (L) 02/04/2016 1330   CBC    Component Value Date/Time   WBC 9.7 02/07/2016 0801   RBC 3.21 (L) 02/07/2016 1137   RBC 3.01 (L) 02/07/2016 0801   HGB 8.8 (L) 02/07/2016 0801   HCT 27.9 (L) 02/07/2016 0801   PLT 225 02/07/2016 0801   MCV 92.7 02/07/2016 0801   MCH 29.2 02/07/2016 0801   MCHC 31.5 02/07/2016 0801   RDW 16.4 (H) 02/07/2016 0801   LYMPHSABS 1.3 01/30/2016 1848   MONOABS 0.6 01/30/2016 1848   EOSABS 0.4 01/30/2016 1848   BASOSABS 0.0 01/30/2016 1848   HEPATIC Function Panel  Recent Labs  01/30/16 1848  PROT 7.1   HEMOGLOBIN A1C No components found for: HGA1C,  MPG CARDIAC  ENZYMES Lab Results  Component Value Date   CKTOTAL 87 04/13/2010   CKMB 1.0 04/13/2010   TROPONINI 0.03 (HH) 01/31/2016   TROPONINI 0.03 (HH) 01/30/2016   TROPONINI 0.03 (HH) 01/30/2016   BNP No results for input(s): PROBNP in the last 8760 hours. TSH No results for input(s): TSH in the last 8760 hours. CHOLESTEROL No results for input(s): CHOL in the last 8760 hours.  Scheduled Meds: . aspirin EC  81 mg Oral Daily  . calcitRIOL  2.5 mcg Oral Q T,Th,Sa-HD  . cinacalcet  90 mg Oral Q breakfast  . darbepoetin (ARANESP) injection - DIALYSIS  60 mcg Intravenous Q Thu-HD  . ferric gluconate (FERRLECIT/NULECIT) IV  62.5 mg Intravenous Q Thu-HD  . heparin  5,000 Units Subcutaneous Q8H  . insulin aspart  0-9 Units Subcutaneous TID WC  . insulin glargine  5 Units Subcutaneous QHS   . ipratropium-albuterol  3 mL Nebulization BID  . levofloxacin  500 mg Oral Q48H  . midodrine  5 mg Oral TID WC  . multivitamin with minerals  1 tablet Oral Daily  . predniSONE  20 mg Oral Q breakfast  . sevelamer carbonate  800 mg Oral BID WC  . sodium chloride flush  3 mL Intravenous Q12H   Continuous Infusions: PRN Meds:.sodium chloride, acetaminophen, guaiFENesin-dextromethorphan, nitroGLYCERIN, ondansetron (ZOFRAN) IV, sodium chloride flush  Assessment/Plan: Acute bronchitis Acute left heart diastolic failure ESRD CAD COPD Type II DM Arthritis  Change to PO antibiotic. Increase activity.   LOS: 9 days    Dixie Dials  MD  02/08/2016, 10:10 AM

## 2016-02-08 NOTE — Progress Notes (Signed)
Patient refused bed alarm. Will continue to monitor patient. 

## 2016-02-08 NOTE — Progress Notes (Signed)
Patient is active with [facility] as prior to admission; Aneta Mins [phone]

## 2016-02-09 LAB — GLUCOSE, CAPILLARY
GLUCOSE-CAPILLARY: 117 mg/dL — AB (ref 65–99)
Glucose-Capillary: 105 mg/dL — ABNORMAL HIGH (ref 65–99)
Glucose-Capillary: 200 mg/dL — ABNORMAL HIGH (ref 65–99)
Glucose-Capillary: 206 mg/dL — ABNORMAL HIGH (ref 65–99)

## 2016-02-09 MED ORDER — MIDODRINE HCL 5 MG PO TABS
ORAL_TABLET | ORAL | Status: AC
  Filled 2016-02-09: qty 1

## 2016-02-09 MED ORDER — ALBUTEROL SULFATE (2.5 MG/3ML) 0.083% IN NEBU: 2.5000 mg | INHALATION_SOLUTION | Freq: Four times a day (QID) | RESPIRATORY_TRACT | Status: DC | PRN

## 2016-02-09 MED ORDER — CALCITRIOL 0.5 MCG PO CAPS
ORAL_CAPSULE | ORAL | Status: AC
  Filled 2016-02-09: qty 5

## 2016-02-09 NOTE — Progress Notes (Signed)
Patient back on unit from dialysis, no concerns. Up in chair with standby assist.  Patients BP is low 80's over 40's, gave Midodrine and will reassess.  This is not unusual for patient and she is asymptomatic at this time.

## 2016-02-09 NOTE — Progress Notes (Signed)
Subjective:  Complains of shortness of breath associated with coughing. Denies any fever or chills. Tolerating hemodialysis.  Objective:  Vital Signs in the last 24 hours: Temp:  [97.7 F (36.5 C)-98.4 F (36.9 C)] 98.4 F (36.9 C) (01/20 0807) Pulse Rate:  [64-84] 83 (01/20 0900) Resp:  [12-19] 12 (01/20 0900) BP: (106-121)/(40-51) 106/47 (01/20 0900) SpO2:  [96 %-100 %] 100 % (01/20 3976) Weight:  [270 lb 4.5 oz (122.6 kg)-272 lb 11.3 oz (123.7 kg)] 270 lb 4.5 oz (122.6 kg) (01/20 0807)  Intake/Output from previous day: 01/19 0701 - 01/20 0700 In: 1240 [P.O.:1240] Out: 0  Intake/Output from this shift: No intake/output data recorded.  Physical Exam: Neck: no adenopathy, no carotid bruit, no JVD and supple, symmetrical, trachea midline Lungs: Bilateral rhonchi noted Heart: regular rate and rhythm, S1, S2 normal and 2/6 systolic murmur noted Abdomen: soft, non-tender; bowel sounds normal; no masses,  no organomegaly Extremities: extremities normal, atraumatic, no cyanosis or edema  Lab Results:  Recent Labs  02/07/16 0801  WBC 9.7  HGB 8.8*  PLT 225    Recent Labs  02/07/16 0800  NA 136  K 4.0  CL 96*  CO2 25  GLUCOSE 94  BUN 61*  CREATININE 8.39*   No results for input(s): TROPONINI in the last 72 hours.  Invalid input(s): CK, MB Hepatic Function Panel  Recent Labs  02/07/16 0800  ALBUMIN 3.2*   No results for input(s): CHOL in the last 72 hours. No results for input(s): PROTIME in the last 72 hours.  Imaging: Imaging results have been reviewed and No results found.  Cardiac Studies:  Assessment/Plan:  Resolving Acute bronchitis Resolving Acute left heart diastolic failure Mild volume overload ESRD CAD COPD Type II DM Anemia of chronic disease Morbid obesity Obstructive sleep apnea Arthritis Plan Continue present management   LOS: 10 days    Charolette Forward 02/09/2016, 10:19 AM

## 2016-02-09 NOTE — Procedures (Signed)
I have seen and examined this patient and agree with the plan of care   BP 115/75  Hb 8.8   Aranesp 73mcg  Iron weekly  Ca 8.3  Phos 2.8   Calcitriol  Cinacalcet   Rocephin q  24 hours    Meira Wahba W 02/09/2016, 8:36 AM

## 2016-02-10 LAB — GLUCOSE, CAPILLARY
GLUCOSE-CAPILLARY: 206 mg/dL — AB (ref 65–99)
GLUCOSE-CAPILLARY: 203 mg/dL — AB (ref 65–99)
Glucose-Capillary: 186 mg/dL — ABNORMAL HIGH (ref 65–99)
Glucose-Capillary: 174 mg/dL — ABNORMAL HIGH (ref 65–99)

## 2016-02-10 MED ORDER — BISACODYL 5 MG PO TBEC
5.0000 mg | DELAYED_RELEASE_TABLET | Freq: Every day | ORAL | Status: DC | PRN
  Administered 2016-02-10: 5 mg via ORAL
  Filled 2016-02-10: qty 1

## 2016-02-10 MED ORDER — DARBEPOETIN ALFA 100 MCG/0.5ML IJ SOSY: 100.0000 ug | PREFILLED_SYRINGE | INTRAMUSCULAR | Status: DC

## 2016-02-10 NOTE — Progress Notes (Signed)
[facility] Progress Note   Subjective: "I'm doing a little better. I had an awful time in dialysis yesterday, I kept clotting that machine!" Up in chair. Slightly breathless when talking. Denies SOB. RR shallow without WOB.   Objective Vitals:   02/09/16 1927 02/10/16 0425 02/10/16 0622 02/10/16 0857  BP: (!) 93/41  (!) 108/49   Pulse: 76 72    Resp: 17 18    Temp: 98 F (36.7 C) 97.5 F (36.4 C)    TempSrc: Oral Oral    SpO2: 98% 99%  99%  Weight:  122.2 kg (269 lb 8 oz)    Height:       Physical Exam General: WN,WD pt in NAD Heart: G6,K5, 2/6 systolic M. Prominent neck veins. Sl JVD at 30 degrees.  Lungs: BBS sl decreased in bases with few bibasilar crackles. No WOB Abdomen: obese, soft, active BS Extremities: Trace BLE edema  Dialysis Access: LUA AVF + bruit   Additional Objective Labs: Basic Metabolic Panel:  Recent Labs Lab 02/04/16 1330 02/05/16 1416 02/07/16 0800  NA 131* 135 136  K 4.5 3.5 4.0  CL 91* 94* 96*  CO2 25 25 25   GLUCOSE 144* 167* 94  BUN 56* 12 61*  CREATININE 9.37* 3.55* 8.39*  CALCIUM 7.8* 8.9 8.3*  PHOS 4.1 2.2* 2.8   Liver Function Tests:  Recent Labs Lab 02/04/16 1330 02/05/16 1416 02/07/16 0800  ALBUMIN 2.8* 3.6 3.2*   No results for input(s): LIPASE, AMYLASE in the last 168 hours. CBC:  Recent Labs Lab 02/04/16 1330 02/05/16 1416 02/07/16 0801  WBC 5.8 8.8 9.7  HGB 8.3* 9.4* 8.8*  HCT 25.4* 30.3* 27.9*  MCV 90.4 92.4 92.7  PLT 206 210 225   Blood Culture    Component Value Date/Time   SDES BLOOD RIGHT ANTECUBITAL 02/01/2016 0839   SPECREQUEST BOTTLES DRAWN AEROBIC AND ANAEROBIC 5CC 02/01/2016 0839   CULT NO GROWTH 5 DAYS 02/01/2016 0839   REPTSTATUS 02/06/2016 FINAL 02/01/2016 0839    CBG:  Recent Labs Lab 02/09/16 0659 02/09/16 1353 02/09/16 1630 02/09/16 2151 02/10/16 0616  GLUCAP 117* 105* 200* 206* 206*   Iron Studies:  Recent Labs  02/07/16 1137  IRON 185*  TIBC 272   FERRITIN 2,583*   @lablastinr3 @ Studies/Results: No results found. Medications:  . aspirin EC  81 mg Oral Daily  . calcitRIOL  2.5 mcg Oral Q T,Th,Sa-HD  . cinacalcet  90 mg Oral Q breakfast  . darbepoetin (ARANESP) injection - DIALYSIS  60 mcg Intravenous Q Thu-HD  . ferric gluconate (FERRLECIT/NULECIT) IV  62.5 mg Intravenous Q Thu-HD  . heparin  5,000 Units Subcutaneous Q8H  . insulin aspart  0-9 Units Subcutaneous TID WC  . insulin glargine  5 Units Subcutaneous QHS  . ipratropium-albuterol  3 mL Nebulization BID  . levofloxacin  500 mg Oral Q48H  . midodrine  5 mg Oral TID WC  . multivitamin with minerals  1 tablet Oral Daily  . sevelamer carbonate  800 mg Oral BID WC  . sodium chloride flush  3 mL Intravenous Q12H   HD orders: [place] T,Th,S 4.5 hours 450/800 130 kg  2.0 K/2.0 Ca  Heparin 13000 units IV q tx Mircera 100 mcg IV q 2 weeks (last dose 01/22/16 last HGB 8.5 01/24/16) Venofer 50 mg IV weekly (last dose 01/24/16 Last Fe 52 TSat 24% 01/10/16) Calcitriol: 2.5 mcg PO q tx (Ca 9.2 C Ca 9.3 Phos 3.6 PTH 1438 01/10/16)   Assessment/Plan: 1. Volume  overload: Resolving with HD. Now under OP EDW. Continue to lower EDW as tolerated. 2. Acute Bronchitis: Per primary. No antibiotics, influenza not tested here. Afebrile last WBC 9.7 2. ESRD -T,Th, S. Next HD Tuesday. Check renal function with HD.  3. Anemia - Last HGB 8.8 Fe 185 TSat 68% Ferritin 2583. STOP weekly venofer. Last ESA dose  02/07/16. Increase dose to 100 mcg IV. Follow HGB.  4. Secondary hyperparathyroidism - Last phos 2.8 Hold binders. Ca 8.3 C Ca 8.9. Cont VDRA/Sensipar 5. HTN/volume - BP stable. Now on midodrine 5 mg PO TID. Last HD 02/09/16 Pre wt 122.6 Net UF 1540 Post wt 122 kg??? Doubt accuracy. Next HD Tuesday if still here. Continue lowering EDW.   6. Nutrition - Albumin 3.2  7. DM: per primary 8. H/O diastolic HF: per primary. Continue lowering EDW. May need home O2.   Rita H. Brown NP-C 02/10/2016,  9:54 AM  [employer] [phone]

## 2016-02-10 NOTE — Progress Notes (Signed)
Patient up in chair for entire 7 a to 7 p shift, visiting with friends and relatives, no complaints other than no BM since 02/07/16.  Gave patent warm prune juice with no results and then contacted Dr. Terrence Dupont who ordered Dulcolax tablets (patients request).  Awaiting results from this.

## 2016-02-10 NOTE — Progress Notes (Signed)
Patient refusing the use of CPAP at this time. RT informed patient if she changes her mind have RN contact RT

## 2016-02-10 NOTE — Plan of Care (Signed)
Problem: Bowel/Gastric: Goal: Will not experience complications related to bowel motility Outcome: Progressing No BM x 3 days, Dulcolax tablets given

## 2016-02-10 NOTE — Progress Notes (Signed)
Subjective:  Up in chair and denies any chest pain states coughing or breathing is improved . Had episode of hypotension after dialysis yesterday denies any dizziness lightheadedness.  Objective:  Vital Signs in the last 24 hours: Temp:  [97.5 F (36.4 C)-98.4 F (36.9 C)] 97.5 F (36.4 C) (01/21 0425) Pulse Rate:  [64-85] 72 (01/21 0425) Resp:  [10-18] 18 (01/21 0425) BP: (82-116)/(40-68) 108/49 (01/21 0622) SpO2:  [96 %-100 %] 99 % (01/21 0857) Weight:  [269 lb 8 oz (122.2 kg)] 269 lb 8 oz (122.2 kg) (01/21 0425)  Intake/Output from previous day: 01/20 0701 - 01/21 0700 In: 700 [P.O.:700] Out: 1540  Intake/Output from this shift: No intake/output data recorded.  Physical Exam: Neck: no adenopathy, no carotid bruit, no JVD and supple, symmetrical, trachea midline Lungs: Occasional rhonchi noted Heart: regular rate and rhythm, S1, S2 normal and 2/6 systolic murmur noted Abdomen: soft, non-tender; bowel sounds normal; no masses,  no organomegaly Extremities: extremities normal, atraumatic, no cyanosis or edema  Lab Results: No results for input(s): WBC, HGB, PLT in the last 72 hours. No results for input(s): NA, K, CL, CO2, GLUCOSE, BUN, CREATININE in the last 72 hours. No results for input(s): TROPONINI in the last 72 hours.  Invalid input(s): CK, MB Hepatic Function Panel No results for input(s): PROT, ALBUMIN, AST, ALT, ALKPHOS, BILITOT, BILIDIR, IBILI in the last 72 hours. No results for input(s): CHOL in the last 72 hours. No results for input(s): PROTIME in the last 72 hours.  Imaging: Imaging results have been reviewed and No results found.  Cardiac Studies:  Assessment/Plan:  Resolving Acute bronchitis Resolving Acute left heart diastolic failure Mild volume overload ESRD on hemodialysis CAD COPD Type II DM Anemia of chronic disease Morbid obesity Obstructive sleep apnea Arthritis Plan Continue present management Home soon  LOS: 11 days     Charolette Forward 02/10/2016, 9:41 AM

## 2016-02-11 ENCOUNTER — Other Ambulatory Visit: Payer: Self-pay

## 2016-02-11 LAB — GLUCOSE, CAPILLARY
GLUCOSE-CAPILLARY: 198 mg/dL — AB (ref 65–99)
Glucose-Capillary: 189 mg/dL — ABNORMAL HIGH (ref 65–99)
Glucose-Capillary: 193 mg/dL — ABNORMAL HIGH (ref 65–99)
Glucose-Capillary: 131 mg/dL — ABNORMAL HIGH (ref 65–99)

## 2016-02-11 NOTE — Progress Notes (Signed)
  [name] Progress Note   Subjective: no c/o  Vitals:   02/10/16 2150 02/11/16 0455 02/11/16 1103 02/11/16 1204  BP: (!) 126/58 (!) 109/51  126/68  Pulse: 66 77  70  Resp: 19 19  18   Temp: 97.5 F (36.4 C) 97.7 F (36.5 C)  98.1 F (36.7 C)  TempSrc: Oral Oral  Oral  SpO2: 100% 100% 98% 100%  Weight:  123.5 kg (272 lb 4.8 oz)    Height:        Inpatient medications: . aspirin EC  81 mg Oral Daily  . calcitRIOL  2.5 mcg Oral Q T,Th,Sa-HD  . cinacalcet  90 mg Oral Q breakfast  . [START ON 02/14/2016] darbepoetin (ARANESP) injection - DIALYSIS  100 mcg Intravenous Q Thu-HD  . heparin  5,000 Units Subcutaneous Q8H  . insulin aspart  0-9 Units Subcutaneous TID WC  . insulin glargine  5 Units Subcutaneous QHS  . ipratropium-albuterol  3 mL Nebulization BID  . levofloxacin  500 mg Oral Q48H  . midodrine  5 mg Oral TID WC  . multivitamin with minerals  1 tablet Oral Daily  . sevelamer carbonate  800 mg Oral BID WC  . sodium chloride flush  3 mL Intravenous Q12H    sodium chloride, acetaminophen, albuterol, bisacodyl, guaiFENesin-dextromethorphan, nitroGLYCERIN, ondansetron (ZOFRAN) IV, sodium chloride flush  Exam: Up in chair, no O2 No jvd Chest clear to bases bilat RRR no mrg No sig LE edema NF, Ox3 LUA AVF+bruit  Dialysis: [facility] TTS   4.5h   130 kg  2/2 bath   Hep 13000   LUA AVF  - M100, last 1/2  - V50 weekly  - 2.5 ug tiw po      Assessment: 1. Dyspnea - much better 2. Vol overload - wt's down 7kg from prior dry wt, looks good 3. ESRD TTS HD 4. Anemia cont meds 5. 2HPTH - no chg 6. Chron hypotension - on midodrine 5 tid 7. DM 8. Hx diastolic HF - lungs clear today  Plan - says she is going home tomorrow after HD. Plan HD 1st shift.    Kelly Splinter MD [facility] pager [phone]   02/11/2016, 3:06 PM    Recent Labs Lab 02/05/16 1416 02/07/16 0800  NA 135 136  K 3.5 4.0  CL 94* 96*  CO2 25 25  GLUCOSE 167* 94   BUN 12 61*  CREATININE 3.55* 8.39*  CALCIUM 8.9 8.3*  PHOS 2.2* 2.8    Recent Labs Lab 02/05/16 1416 02/07/16 0800  ALBUMIN 3.6 3.2*    Recent Labs Lab 02/05/16 1416 02/07/16 0801  WBC 8.8 9.7  HGB 9.4* 8.8*  HCT 30.3* 27.9*  MCV 92.4 92.7  PLT 210 225   Iron/TIBC/Ferritin/ %Sat    Component Value Date/Time   IRON 185 (H) 02/07/2016 1137   TIBC 272 02/07/2016 1137   FERRITIN 2,583 (H) 02/07/2016 1137   IRONPCTSAT 68 (H) 02/07/2016 1137

## 2016-02-11 NOTE — Progress Notes (Signed)
Pt has old bipap order and does not need at this time.  Rt will monitor.

## 2016-02-11 NOTE — Patient Outreach (Signed)
[facility]) Care Management  02/11/2016  HULDA REDDIX 1945-12-23 [id number]   Subjective: none  Objective: none  Assessment: Referral per [facility]. RNCM received notification to close case due to patient hospitalized greater than 10 days.   Plan: per policy/procedure, RNCM will close case case. Hospital liaison to continue to follow and will re-refer if appropriate.  [name], RN, MSN, [place] Coordinator Cell: [phone]

## 2016-02-11 NOTE — Progress Notes (Signed)
Ref: [name], MD   Subjective:  Feeling better. Some cough. Afebrile.   Objective:  Vital Signs in the last 24 hours: Temp:  [97.5 F (36.4 C)-98.1 F (36.7 C)] 98.1 F (36.7 C) (01/22 1204) Pulse Rate:  [66-77] 70 (01/22 1204) Cardiac Rhythm: Normal sinus rhythm (01/22 0806) Resp:  [18-19] 18 (01/22 1204) BP: (109-126)/(51-68) 126/68 (01/22 1204) SpO2:  [96 %-100 %] 100 % (01/22 1204) Weight:  [123.5 kg (272 lb 4.8 oz)] 123.5 kg (272 lb 4.8 oz) (01/22 0455)  Physical Exam: BP Readings from Last 1 Encounters:  02/11/16 126/68    Wt Readings from Last 1 Encounters:  02/11/16 123.5 kg (272 lb 4.8 oz)    Weight change: 0.915 kg (2 lb 0.3 oz) Body mass index is 45.31 kg/m. HEENT: [place]/AT, Eyes-Brown and prominant eyes, PERL, EOMI, Conjunctiva-Pale, Sclera-Non-icteric Neck: No JVD, No bruit, Trachea midline. Lungs:  Scattered rhonchi with cough, Bilateral. Cardiac:  Regular rhythm, normal S1 and S2, no S3. II/VI systolic murmur. Abdomen:  Soft, non-tender. BS present. Extremities:  No edema present. No cyanosis. No clubbing. AVF in left upper arm. CNS: AxOx3, Cranial nerves grossly intact, moves all 4 extremities.  Skin: Warm and dry.   Intake/Output from previous day: 01/21 0701 - 01/22 0700 In: 960 [P.O.:960] Out: 0     Lab Results: BMET    Component Value Date/Time   NA 136 02/07/2016 0800   NA 135 02/05/2016 1416   NA 131 (L) 02/04/2016 1330   K 4.0 02/07/2016 0800   K 3.5 02/05/2016 1416   K 4.5 02/04/2016 1330   CL 96 (L) 02/07/2016 0800   CL 94 (L) 02/05/2016 1416   CL 91 (L) 02/04/2016 1330   CO2 25 02/07/2016 0800   CO2 25 02/05/2016 1416   CO2 25 02/04/2016 1330   GLUCOSE 94 02/07/2016 0800   GLUCOSE 167 (H) 02/05/2016 1416   GLUCOSE 144 (H) 02/04/2016 1330   BUN 61 (H) 02/07/2016 0800   BUN 12 02/05/2016 1416   BUN 56 (H) 02/04/2016 1330   CREATININE 8.39 (H) 02/07/2016 0800   CREATININE 3.55 (H) 02/05/2016 1416   CREATININE 9.37 (H)  02/04/2016 1330   CREATININE 2.86 (H) 10/10/2010 1140   CREATININE 2.20 (H) 06/26/2010 1637   CALCIUM 8.3 (L) 02/07/2016 0800   CALCIUM 8.9 02/05/2016 1416   CALCIUM 7.8 (L) 02/04/2016 1330   CALCIUM 8.7 01/08/2010 1121   CALCIUM 9.1 10/08/2009 0922   CALCIUM 8.6 07/30/2009 1102   GFRNONAA 4 (L) 02/07/2016 0800   GFRNONAA 12 (L) 02/05/2016 1416   GFRNONAA 4 (L) 02/04/2016 1330   GFRAA 5 (L) 02/07/2016 0800   GFRAA 14 (L) 02/05/2016 1416   GFRAA 4 (L) 02/04/2016 1330   CBC    Component Value Date/Time   WBC 9.7 02/07/2016 0801   RBC 3.21 (L) 02/07/2016 1137   RBC 3.01 (L) 02/07/2016 0801   HGB 8.8 (L) 02/07/2016 0801   HCT 27.9 (L) 02/07/2016 0801   PLT 225 02/07/2016 0801   MCV 92.7 02/07/2016 0801   MCH 29.2 02/07/2016 0801   MCHC 31.5 02/07/2016 0801   RDW 16.4 (H) 02/07/2016 0801   LYMPHSABS 1.3 01/30/2016 1848   MONOABS 0.6 01/30/2016 1848   EOSABS 0.4 01/30/2016 1848   BASOSABS 0.0 01/30/2016 1848   HEPATIC Function Panel  Recent Labs  01/30/16 1848  PROT 7.1   HEMOGLOBIN A1C No components found for: HGA1C,  MPG CARDIAC ENZYMES Lab Results  Component Value Date  CKTOTAL 87 04/13/2010   CKMB 1.0 04/13/2010   TROPONINI 0.03 (HH) 01/31/2016   TROPONINI 0.03 (HH) 01/30/2016   TROPONINI 0.03 (HH) 01/30/2016   BNP No results for input(s): PROBNP in the last 8760 hours. TSH  Recent Labs  02/08/16 1217  TSH 1.146   CHOLESTEROL No results for input(s): CHOL in the last 8760 hours.  Scheduled Meds: . aspirin EC  81 mg Oral Daily  . calcitRIOL  2.5 mcg Oral Q T,Th,Sa-HD  . cinacalcet  90 mg Oral Q breakfast  . [START ON 02/14/2016] darbepoetin (ARANESP) injection - DIALYSIS  100 mcg Intravenous Q Thu-HD  . heparin  5,000 Units Subcutaneous Q8H  . insulin aspart  0-9 Units Subcutaneous TID WC  . insulin glargine  5 Units Subcutaneous QHS  . ipratropium-albuterol  3 mL Nebulization BID  . levofloxacin  500 mg Oral Q48H  . midodrine  5 mg Oral TID WC   . multivitamin with minerals  1 tablet Oral Daily  . sevelamer carbonate  800 mg Oral BID WC  . sodium chloride flush  3 mL Intravenous Q12H   Continuous Infusions: PRN Meds:.sodium chloride, acetaminophen, albuterol, bisacodyl, guaiFENesin-dextromethorphan, nitroGLYCERIN, ondansetron (ZOFRAN) IV, sodium chloride flush  Assessment/Plan: COPD with acute bronchitis Acute left heart diastolic failure CAD ESRD Type II DM Arthritis Obesity  Increase activity. Home post hemodialysis.   LOS: 12 days    Tiffany Dials  MD  02/11/2016, 6:39 PM

## 2016-02-12 LAB — GLUCOSE, CAPILLARY
Glucose-Capillary: 114 mg/dL — ABNORMAL HIGH (ref 65–99)
Glucose-Capillary: 118 mg/dL — ABNORMAL HIGH (ref 65–99)
Glucose-Capillary: 139 mg/dL — ABNORMAL HIGH (ref 65–99)

## 2016-02-12 MED ORDER — SEVELAMER CARBONATE 800 MG PO TABS: 800.0000 mg | ORAL_TABLET | Freq: Three times a day (TID) | ORAL | Status: DC

## 2016-02-12 MED ORDER — ALBUTEROL SULFATE (2.5 MG/3ML) 0.083% IN NEBU: 2.5000 mg | INHALATION_SOLUTION | Freq: Four times a day (QID) | RESPIRATORY_TRACT | 12 refills | Status: DC | PRN

## 2016-02-12 MED ORDER — LEVOFLOXACIN 500 MG PO TABS: 500.0000 mg | ORAL_TABLET | ORAL | 0 refills | Status: DC

## 2016-02-12 MED ORDER — MIDODRINE HCL 5 MG PO TABS: 5.0000 mg | ORAL_TABLET | Freq: Three times a day (TID) | ORAL | 3 refills | Status: DC

## 2016-02-12 MED ORDER — PREDNISONE 10 MG PO TABS: 10.0000 mg | ORAL_TABLET | Freq: Every day | ORAL | Status: DC

## 2016-02-12 MED ORDER — PREDNISONE 10 MG PO TABS: 10.0000 mg | ORAL_TABLET | Freq: Every day | ORAL | 0 refills | Status: DC

## 2016-02-12 MED ORDER — GUAIFENESIN-DM 100-10 MG/5ML PO SYRP: 5.0000 mL | ORAL_SOLUTION | ORAL | 0 refills | Status: DC | PRN

## 2016-02-12 NOTE — Discharge Summary (Signed)
Physician Discharge Summary  Patient ID: [name] MRN: [id number] DOB/AGE: [birth year] 71 y.o.  Admit date: 01/30/2016 Discharge date: 02/12/2016  Admission Diagnoses: Acute systolic left heart failure ESRD Type II DM Hypertension CAD GOUT Obesity Arthritis  Discharge Diagnoses:  Principal Problem:   Acute left diastolic heart failure (HCC) Active Problems:   COPD with acute bronchitis   CAD   End stage renal disease (HCC)   Arthritis   Morbid Obesity   Type 2 diabetes mellitus (HCC)   Anemia of chronic kidney disease  Discharged Condition: fair  Hospital Course: 71 year old female was admitted for shortness of breath with leg edema and occasional chest pain. Her chest x-ray showed vascular congestion, mild pulmonary edema and borderline cardiomegaly. Echocardiogram showed moderate LVH and normal systolic function and very mild aortic valve stenosis. She responded to extra dialysis treatments with gradually 10 pound reduction in her dry weight. She then developed acute bronchitis treated with IV antibiotic followed by oral antibiotic and breathing treatments. Her oxygen level gradually improved and she was discharged home on oral antibiotic every 48 hour. She will see me in 1 week and primary doctor in 1 month.  Consults: cardiology and nephrology  Significant Diagnostic Studies: labs: Elevated potassium and BUN-45 and creatinine of 7.77. Albumin low at 3.0 g. Normal WBC and platelets count. Low hemoglobin of 8.7. Normal iron level. Elevated BNP over 1568.3 decreasing to 119.1 in 9 days. Hgb A1C of 5.7. Normal TSH and T4.  EKG-NSR  Chest x-ray: Cardiomegaly and pulmonary edema.  Echocardiogram: Moderate LVH, normal LV systolic function and very mild aortic stenosis  Treatments: cardiac meds: Aspirin, steroids: prednisone and respiratory therapy: albuterol/atropine nebulizer and hemodialysis treatments.  Discharge Exam: Blood pressure (!) 98/55, pulse 81,  temperature 98.2 F (36.8 C), temperature source Oral, resp. rate 19, height 5\' 5"  (1.651 m), weight 124.9 kg (275 lb 5.7 oz), SpO2 99 %. General appearance: alert, cooperative and appears stated age. No distress. Head: Normocephalic, atraumatic. 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 Pale conjunctivae/corneas clear. PERRL, EOM's intact.  Neck: no adenopathy, no carotid bruit, no JVD, supple, symmetrical, trachea midline and thyroid not enlarged. Resp: Few scattered rhonchi to auscultation bilaterally. Cardio: regular rate and rhythm, S1, S2 normal, II/VI systolic murmur, click, rub or gallop GI: soft, non-tender; bowel sounds normal; no masses,  no organomegaly Extremities: extremities normal, atraumatic, no cyanosis or edema Skin: Warm and dry. No rashes or lesions Neurologic: Alert and oriented X 3, normal strength and tone. Normal coordination and slow gait with cane support.  Disposition: 01-Home or Self Care  Discharge Instructions    AMB Referral to [place] Management    Complete by:  As directed    Reason for consult:  Post hospital follow up; High risk - HD patient as well   Diagnoses of:   COPD/ Pneumonia Heart Failure Kidney Failure Diabetes     Expected date of contact:  1-3 days (reserved for hospital discharges)   Patient was previously active with the [facility]. Please assign to community nurse for transition of care calls and assess for home visits. Check on patient's oxygen needs. Questions please call:   Natividad Brood, RN BSN [facility] Liaison  [phone] business mobile phone Toll free office [phone]     Allergies as of 02/12/2016   No Known Allergies     Medication List    STOP taking these medications   carvedilol 25 MG tablet Commonly known as:  COREG  indomethacin 50 MG capsule Commonly known as:  INDOCIN   isosorbide mononitrate 60 MG 24 hr tablet Commonly known as:  IMDUR   potassium chloride SA 20 MEQ tablet Commonly  known as:  K-DUR,KLOR-CON     TAKE these medications   albuterol 108 (90 Base) MCG/ACT inhaler Commonly known as:  PROVENTIL HFA;VENTOLIN HFA Inhale 2 puffs into the lungs every 4 (four) hours as needed for wheezing or shortness of breath. What changed:  Another medication with the same name was added. Make sure you understand how and when to take each.   albuterol (2.5 MG/3ML) 0.083% nebulizer solution Commonly known as:  PROVENTIL Take 3 mLs (2.5 mg total) by nebulization every 6 (six) hours as needed for wheezing or shortness of breath. What changed:  You were already taking a medication with the same name, and this prescription was added. Make sure you understand how and when to take each.   aspirin EC 81 MG tablet Take 81 mg by mouth daily.   bisacodyl 5 MG EC tablet Commonly known as:  DULCOLAX Take 10 mg by mouth daily as needed for moderate constipation.   CALCIUM 600 + D PO Take 1 tablet by mouth daily.   ethyl chloride spray Apply 1 application topically daily as needed. For HD   guaiFENesin-dextromethorphan 100-10 MG/5ML syrup Commonly known as:  ROBITUSSIN DM Take 5 mLs by mouth every 4 (four) hours as needed for cough.   insulin glargine 100 UNIT/ML injection Commonly known as:  LANTUS Inject 5-10 Units into the skin at bedtime. Patient uses sliding scale.   levofloxacin 500 MG tablet Commonly known as:  LEVAQUIN Take 1 tablet (500 mg total) by mouth every other day. Start taking on:  02/14/2016   midodrine 5 MG tablet Commonly known as:  PROAMATINE Take 1 tablet (5 mg total) by mouth 3 (three) times daily with meals. What changed:  medication strength  how much to take  when to take this   multivitamin with minerals Tabs tablet Take 1 tablet by mouth daily.   nitroGLYCERIN 0.4 MG SL tablet Commonly known as:  NITROSTAT Place 0.4 mg under the tongue every 5 (five) minutes as needed for chest pain.   predniSONE 10 MG tablet Commonly known as:   DELTASONE Take 1 tablet (10 mg total) by mouth daily with breakfast. For 7 days then 1/2 tablet daily x 6 days.   SENSIPAR 90 MG tablet Generic drug:  cinacalcet Take 90 mg by mouth daily.   sevelamer carbonate 800 MG tablet Commonly known as:  RENVELA Take 1 tablet (800 mg total) by mouth 3 (three) times daily with meals. What changed:  how much to take  when to take this            Durable Medical Equipment        Start     Ordered   02/12/16 0900  For home use only DME Nebulizer machine  Once    Question:  Patient needs a nebulizer to treat with the following condition  Answer:  COPD (chronic obstructive pulmonary disease) with acute bronchitis ([place])   02/11/16 1833     Follow-up Information    [facility] Follow up.   Specialty:  [place] Why:  They will do your home health care from your home Contact information: [place] [address] [phone]        [name], MD. Schedule an appointment as soon as possible for a visit  in 1 month(s).   Specialty:  Internal Medicine Contact information: [address] [place] [address] [phone]        Birdie Riddle, MD. Schedule an appointment as soon as possible for a visit in 1 week(s).   Specialty:  Cardiology Contact information: [place] [address] [phone]           Signed: Birdie Riddle 02/12/2016, 6:14 PM

## 2016-02-12 NOTE — Progress Notes (Signed)
Received post HD, report received from [facility].Tiffany KitchenPatient alert and oriented no distress noted. Discharge to home per order.

## 2016-02-12 NOTE — Procedures (Signed)
On HD stable, for dc after HD today.    I was present at this dialysis session, have reviewed the session itself and made  appropriate changes Kelly Splinter MD [place] pager [phone]   02/12/2016, 4:22 PM

## 2016-02-12 NOTE — Care Management Important Message (Signed)
Important Message  Patient Details  Name: [name] MRN: [id number] Date of Birth: July 22, 1945   Medicare Important Message Given:  Yes    [name] [name] 02/12/2016, 11:43 AM

## 2016-02-13 ENCOUNTER — Other Ambulatory Visit: Payer: Self-pay

## 2016-02-13 NOTE — Patient Outreach (Addendum)
[facility]) Care Management  02/13/2016  [name] 1945/07/30 [id number]  Assessment: 71 year old hospitalized 1/10-1/23 with heart failure and acute bronchitis. History of ESRD/HD, DM, COPD. Per chart discharged home with [facility]. RNCM called for transition of care call. No answer. Unable to leave message.  Plan: Continue to attempt to reach client.   Tiffany Silversmith, RN, MSN, [place] Coordinator Cell: [phone]

## 2016-02-13 NOTE — Progress Notes (Signed)
Pt was discharged assisted by the NT per stretcher, previous RN instructed the pt's discharged summary with understanding. No questions ask right now.

## 2016-02-13 NOTE — Patient Outreach (Signed)
[facility]) Care Management  02/13/2016  KAYSEN SEFCIK Nov 03, 1945 [id number]   Assessment: 71 year old hospitalized 1/10-1/23 with heart failure and acute bronchitis. RNCM called to complete transition of care call. This is the second attempt today. Unable to leave message.  Plan: telephonic transition of care call tomorrow.  [name], RN, MSN, [place] Coordinator Cell: [phone]

## 2016-02-14 ENCOUNTER — Other Ambulatory Visit: Payer: Self-pay

## 2016-02-14 DIAGNOSIS — N2581 Secondary hyperparathyroidism of renal origin: Secondary | ICD-10-CM | POA: Diagnosis not present

## 2016-02-14 DIAGNOSIS — N186 End stage renal disease: Secondary | ICD-10-CM | POA: Diagnosis not present

## 2016-02-14 NOTE — Patient Outreach (Signed)
[facility] [facility]) Care Management  02/14/2016  [name] 1945/02/12 [id number]   Assessment: 71 year old hospitalized 1/10-1/23 with heart failure and acute bronchitis. History of ESRD/HD, DM, COPD. Per chart discharged home with [facility]. RNCM called for transition of care call. No answer. HIPPA compliant voice message left. 2nd day attempting to reach client.  Plan: If no return call. Telephonic transition of care call tomorrow.  [name], RN, MSN, [place] Coordinator Cell: [phone]

## 2016-02-15 ENCOUNTER — Other Ambulatory Visit: Payer: Self-pay

## 2016-02-15 NOTE — Patient Outreach (Addendum)
[place] [name]) Care Management  [place]  02/15/2016   EDRIE EHRICH 05-16-1945 [id number]  Subjective: reports doing better. Reports her breathing is much better.  Objective: telephonic assessment.  Encounter Medications:  Outpatient Encounter Prescriptions as of 02/15/2016  Medication Sig  . albuterol (PROVENTIL HFA;VENTOLIN HFA) 108 (90 BASE) MCG/ACT inhaler Inhale 2 puffs into the lungs every 4 (four) hours as needed for wheezing or shortness of breath.  Marland Kitchen aspirin EC 81 MG tablet Take 81 mg by mouth daily.    . bisacodyl (DULCOLAX) 5 MG EC tablet Take 10 mg by mouth daily as needed for moderate constipation.   . Calcium Carbonate-Vitamin D (CALCIUM 600 + D PO) Take 1 tablet by mouth daily.   Marland Kitchen guaiFENesin-dextromethorphan (ROBITUSSIN DM) 100-10 MG/5ML syrup Take 5 mLs by mouth every 4 (four) hours as needed for cough.  . insulin glargine (LANTUS) 100 UNIT/ML injection Inject 5-10 Units into the skin at bedtime. Patient uses sliding scale.  Marland Kitchen levofloxacin (LEVAQUIN) 500 MG tablet Take 1 tablet (500 mg total) by mouth every other day.  . midodrine (PROAMATINE) 5 MG tablet Take 1 tablet (5 mg total) by mouth 3 (three) times daily with meals.  . Multiple Vitamin (MULITIVITAMIN WITH MINERALS) TABS Take 1 tablet by mouth daily.  . nitroGLYCERIN (NITROSTAT) 0.4 MG SL tablet Place 0.4 mg under the tongue every 5 (five) minutes as needed for chest pain.  . predniSONE (DELTASONE) 10 MG tablet Take 1 tablet (10 mg total) by mouth daily with breakfast. For 7 days then 1/2 tablet daily x 6 days.  . SENSIPAR 90 MG tablet Take 90 mg by mouth daily.  . sevelamer carbonate (RENVELA) 800 MG tablet Take 1 tablet (800 mg total) by mouth 3 (three) times daily with meals.  Marland Kitchen albuterol (PROVENTIL) (2.5 MG/3ML) 0.083% nebulizer solution Take 3 mLs (2.5 mg total) by nebulization every 6 (six) hours as needed for wheezing or shortness of breath. (Patient not taking: Reported on  02/15/2016)  . ethyl chloride spray Apply 1 application topically daily as needed. For HD   No facility-administered encounter medications on file as of 02/15/2016.     Functional Status:  In your present state of health, do you have any difficulty performing the following activities: 02/15/2016 01/30/2016  Hearing? N N  Vision? Y N  Difficulty concentrating or making decisions? N N  Walking or climbing stairs? Y N  Dressing or bathing? N N  Doing errands, shopping? Y N  Preparing Food and eating ? N -  Using the Toilet? N -  In the past six months, have you accidently leaked urine? N -  Do you have problems with loss of bowel control? N -  Managing your Medications? Y -  Managing your Finances? N -  Housekeeping or managing your Housekeeping? N -  Some recent data might be hidden    Fall/Depression Screening: PHQ 2/9 Scores 02/15/2016 11/29/2015 11/08/2015 09/27/2015 09/18/2015  PHQ - 2 Score 0 0 0 0 0   Fall Risk  02/15/2016 11/29/2015 11/08/2015 09/27/2015  Falls in the past year? No Yes No No  Number falls in past yr: - 1 - -  Injury with Fall? - Yes - -  Follow up - Falls prevention discussed - -   Assessment: 71 year old hospitalized 1/10-1/23 with heart failure and acute bronchitis. History of ESRD/HD, DM, COPD. Per chart discharged home with [facility]. RNCM called for transition of care. Spoke with client. Two patient  identifiers confirmed. Patient reports she is feeling better and denies any issues or concerns at this time.   Re: heart failure-member reports feeling better. She has scales and states sometimes she records her weights and sometimes she doesn't. She is a dialysis patient and attends dialysis Tuesday, Thursday and Saturday.  Medications reviewed: Client reports she has her medications, but does not have nebulizer machine or medication for the nebulizer machine. Mrs. Renk states she has albuterol inhaler and has not had to use her rescue inhaler.  Re: home  health: Client reports she has not heard from the home health agency.  RNCM provided 24 hour nurse advice line number. Encouraged to call as needed. RNCM provided contact number and encouraged to call as needed.  Plan: RNCM will call home health agency to follow up. Continue to follow for transition of care. RNCM will meet client at cardiologist office next week.  [name] CM Care Plan Problem One   Flowsheet Row Most Recent Value  Care Plan Problem One  at risk for readmissiton as evidence by recent admission  Role Documenting the Problem One  Care Management Jenkins for Problem One  Active  [name] Long Term Goal (31-90 days)  client will not be readmitted within the next 31-45 days. [client will not be readmitted within the next 31-45 days.]  [name] Long Term Goal Start Date  02/15/16  Interventions for Problem One Long Term Goal  transition of care call, RNCM provided 24 hour nurse advice contact number, RNCM provided contact number and encouraged to call as needed. RNCM called to follow up on start of care date and notified patient. RNCM called and updated cardiologist(admitting/discharging provider) that client does not have nebulizer machine/or nebulizer medication.  [name] CM Short Term Goal #1 (0-30 days)  client will verbalize contact with home health agency witin the next 1-2 weeks.  [name] CM Short Term Goal #1 Start Date  02/15/16  Interventions for Short Term Goal #1  updated home health agency of clients discharge,  notified client's doctor.  [name] CM Short Term Goal #2 (0-30 days)  client will verbalize schedulling primary care appointment within the next 1-2 weeks.  [name] CM Short Term Goal #2 Start Date  02/15/16  Interventions for Short Term Goal #2  encouraged client to follow up if no return call from primary care.      [name], RN, MSN, [place] Coordinator Cell: [phone] .

## 2016-02-15 NOTE — Patient Outreach (Signed)
[facility] [facility]) Care Management  02/15/2016  [name] 1945-11-17 [id number]   Care Coordination: RNCM called [facility] regarding services ordered. Two patient identifiers confirmed. RNCM spoke with BJ who reports home health nursing, physical therapy and occupational therapy ordered. [facility] reports their last report was patient was in the hospital. [facility] reports they will reach out to patient to establish start of care date.  [name], RN, MSN, [place] Coordinator Cell: [phone]

## 2016-02-16 DIAGNOSIS — N2581 Secondary hyperparathyroidism of renal origin: Secondary | ICD-10-CM | POA: Diagnosis not present

## 2016-02-16 DIAGNOSIS — N186 End stage renal disease: Secondary | ICD-10-CM | POA: Diagnosis not present

## 2016-02-18 DIAGNOSIS — D649 Anemia, unspecified: Secondary | ICD-10-CM | POA: Diagnosis not present

## 2016-02-18 DIAGNOSIS — J209 Acute bronchitis, unspecified: Secondary | ICD-10-CM | POA: Diagnosis not present

## 2016-02-18 DIAGNOSIS — E119 Type 2 diabetes mellitus without complications: Secondary | ICD-10-CM | POA: Diagnosis not present

## 2016-02-18 DIAGNOSIS — I509 Heart failure, unspecified: Secondary | ICD-10-CM | POA: Diagnosis not present

## 2016-02-18 DIAGNOSIS — N186 End stage renal disease: Secondary | ICD-10-CM | POA: Diagnosis not present

## 2016-02-19 ENCOUNTER — Other Ambulatory Visit: Payer: Self-pay

## 2016-02-19 DIAGNOSIS — N2581 Secondary hyperparathyroidism of renal origin: Secondary | ICD-10-CM | POA: Diagnosis not present

## 2016-02-19 DIAGNOSIS — N186 End stage renal disease: Secondary | ICD-10-CM | POA: Diagnosis not present

## 2016-02-19 NOTE — Patient Outreach (Signed)
[facility]) Care Management  [place]  02/19/2016   JASLINE BUSKIRK October 06, 1945 [id number]  Subjective: client reports going to dialysis today.  Objective: telephonic call  Encounter Medications:  Outpatient Encounter Prescriptions as of 02/19/2016  Medication Sig  . albuterol (PROVENTIL HFA;VENTOLIN HFA) 108 (90 BASE) MCG/ACT inhaler Inhale 2 puffs into the lungs every 4 (four) hours as needed for wheezing or shortness of breath.  Marland Kitchen albuterol (PROVENTIL) (2.5 MG/3ML) 0.083% nebulizer solution Take 3 mLs (2.5 mg total) by nebulization every 6 (six) hours as needed for wheezing or shortness of breath. (Patient not taking: Reported on 02/15/2016)  . aspirin EC 81 MG tablet Take 81 mg by mouth daily.    . bisacodyl (DULCOLAX) 5 MG EC tablet Take 10 mg by mouth daily as needed for moderate constipation.   . Calcium Carbonate-Vitamin D (CALCIUM 600 + D PO) Take 1 tablet by mouth daily.   Marland Kitchen ethyl chloride spray Apply 1 application topically daily as needed. For HD  . guaiFENesin-dextromethorphan (ROBITUSSIN DM) 100-10 MG/5ML syrup Take 5 mLs by mouth every 4 (four) hours as needed for cough.  . insulin glargine (LANTUS) 100 UNIT/ML injection Inject 5-10 Units into the skin at bedtime. Patient uses sliding scale.  Marland Kitchen levofloxacin (LEVAQUIN) 500 MG tablet Take 1 tablet (500 mg total) by mouth every other day.  . midodrine (PROAMATINE) 5 MG tablet Take 1 tablet (5 mg total) by mouth 3 (three) times daily with meals.  . Multiple Vitamin (MULITIVITAMIN WITH MINERALS) TABS Take 1 tablet by mouth daily.  . nitroGLYCERIN (NITROSTAT) 0.4 MG SL tablet Place 0.4 mg under the tongue every 5 (five) minutes as needed for chest pain.  . predniSONE (DELTASONE) 10 MG tablet Take 1 tablet (10 mg total) by mouth daily with breakfast. For 7 days then 1/2 tablet daily x 6 days.  . SENSIPAR 90 MG tablet Take 90 mg by mouth daily.  . sevelamer carbonate (RENVELA) 800 MG tablet Take 1 tablet  (800 mg total) by mouth 3 (three) times daily with meals.   No facility-administered encounter medications on file as of 02/19/2016.     Functional Status:  In your present state of health, do you have any difficulty performing the following activities: 11/16/2015 09/27/2015  Hearing? N N  Vision? N Y  Difficulty concentrating or making decisions? N N  Walking or climbing stairs? N Y  Dressing or bathing? N N  Doing errands, shopping? - Y  Conservation officer, nature and eating ? - N  Using the Toilet? - N  In the past six months, have you accidently leaked urine? - N  Do you have problems with loss of bowel control? - N  Managing your Medications? - (No Data)  Managing your Finances? - Y  Housekeeping or managing your Housekeeping? - Y  Some encounter information is confidential and restricted. Go to Review Flowsheets activity to see all data.  Some recent data might be hidden    Fall/Depression Screening: PHQ 2/9 Scores 11/29/2015 11/08/2015 09/27/2015 09/18/2015  PHQ - 2 Score 0 0 0 0  Some encounter information is confidential and restricted. Go to Review Flowsheets activity to see all data.    Assessment: 71 year old with hospitalized 1/10-1/23 with heart failure and acute bronchitis. History of ESRD/HD, DM, COPD. RNCM received report of EMMI red flag, client did not weigh self yesterday. RNCM called per policy. Client went to dialysis today. Denies any questions or concerns at this time. RNCM encouraged client  to weigh daily..   Plan: RNCM will meet client at cardiologist office this week.  [name], RN, MSN, [place] Coordinator Cell: [phone]'

## 2016-02-20 ENCOUNTER — Other Ambulatory Visit: Payer: Self-pay

## 2016-02-20 DIAGNOSIS — N186 End stage renal disease: Secondary | ICD-10-CM | POA: Diagnosis not present

## 2016-02-20 DIAGNOSIS — I5032 Chronic diastolic (congestive) heart failure: Secondary | ICD-10-CM | POA: Diagnosis not present

## 2016-02-20 DIAGNOSIS — Z992 Dependence on renal dialysis: Secondary | ICD-10-CM | POA: Diagnosis not present

## 2016-02-20 DIAGNOSIS — I2510 Atherosclerotic heart disease of native coronary artery without angina pectoris: Secondary | ICD-10-CM | POA: Diagnosis not present

## 2016-02-20 DIAGNOSIS — E1129 Type 2 diabetes mellitus with other diabetic kidney complication: Secondary | ICD-10-CM | POA: Diagnosis not present

## 2016-02-20 NOTE — Patient Outreach (Signed)
[facility]) Care Management  [place]  02/20/2016   [name] 1945-02-07 [id number]  Subjective: client reports feeling better, reports her blood sugars have been low for her 71 this morning and 64 on yesterday.  Objective: BP 140/60 Comment: per Dr. Merrilee Jansky office  Pulse 64 Comment: per Dr. Merrilee Jansky office  Ht 1.651 m ('5\' 5"' )   Wt 279 lb 12.8 oz (126.9 kg) Comment: per Dr. Merrilee Jansky office  BMI 46.56 kg/m    Encounter Medications:  Outpatient Encounter Prescriptions as of 02/20/2016  Medication Sig  . albuterol (PROVENTIL HFA;VENTOLIN HFA) 108 (90 BASE) MCG/ACT inhaler Inhale 2 puffs into the lungs every 4 (four) hours as needed for wheezing or shortness of breath.  Marland Kitchen albuterol (PROVENTIL) (2.5 MG/3ML) 0.083% nebulizer solution Take 3 mLs (2.5 mg total) by nebulization every 6 (six) hours as needed for wheezing or shortness of breath.  Marland Kitchen aspirin EC 81 MG tablet Take 81 mg by mouth daily.    . bisacodyl (DULCOLAX) 5 MG EC tablet Take 10 mg by mouth daily as needed for moderate constipation.   . Calcium Carbonate-Vitamin D (CALCIUM 600 + D PO) Take 1 tablet by mouth daily.   Marland Kitchen ethyl chloride spray Apply 1 application topically daily as needed. For HD  . insulin glargine (LANTUS) 100 UNIT/ML injection Inject 5-10 Units into the skin at bedtime. Patient uses sliding scale.  . midodrine (PROAMATINE) 5 MG tablet Take 1 tablet (5 mg total) by mouth 3 (three) times daily with meals.  . Multiple Vitamin (MULITIVITAMIN WITH MINERALS) TABS Take 1 tablet by mouth daily.  . nitroGLYCERIN (NITROSTAT) 0.4 MG SL tablet Place 0.4 mg under the tongue every 5 (five) minutes as needed for chest pain.  . predniSONE (DELTASONE) 10 MG tablet Take 1 tablet (10 mg total) by mouth daily with breakfast. For 7 days then 1/2 tablet daily x 6 days.  . SENSIPAR 90 MG tablet Take 90 mg by mouth daily.  . sevelamer carbonate (RENVELA) 800 MG tablet Take 1 tablet (800 mg total) by  mouth 3 (three) times daily with meals.  Marland Kitchen guaiFENesin-dextromethorphan (ROBITUSSIN DM) 100-10 MG/5ML syrup Take 5 mLs by mouth every 4 (four) hours as needed for cough. (Patient not taking: Reported on 02/20/2016)  . levofloxacin (LEVAQUIN) 500 MG tablet Take 1 tablet (500 mg total) by mouth every other day. (Patient not taking: Reported on 02/20/2016)   No facility-administered encounter medications on file as of 02/20/2016.     Functional Status:  In your present state of health, do you have any difficulty performing the following activities: 02/15/2016 01/30/2016  Hearing? N N  Vision? Y N  Difficulty concentrating or making decisions? N N  Walking or climbing stairs? Y N  Dressing or bathing? N N  Doing errands, shopping? Y N  Preparing Food and eating ? N -  Using the Toilet? N -  In the past six months, have you accidently leaked urine? N -  Do you have problems with loss of bowel control? N -  Managing your Medications? Y -  Managing your Finances? N -  Housekeeping or managing your Housekeeping? N -  Some recent data might be hidden    Fall/Depression Screening: PHQ 2/9 Scores 02/15/2016 11/29/2015 11/08/2015 09/27/2015 09/18/2015  PHQ - 2 Score 0 0 0 0 0   Fall Risk  02/15/2016 11/29/2015 11/08/2015 09/27/2015  Falls in the past year? No Yes No No  Number falls in past yr: - 1 - -  Injury with Fall? - Yes - -  Follow up - Falls prevention discussed - -    Assessment: 71 year old with recent admission for heart failure. History of heart failure, DM,ESRD/HD,COPD with acute bronchitis. RNCM met client at cardiologist office(admitting physician). Client reports she is feeling better, walking better. Ambulate steady with walker. She reports she had not heard from the home health, but adds that she does not need/want home health she attends dialysis three days/week and has a personal care assistant 2 hours/day 7 days/week(present at office visit).   Per Dr. Doylene Canard, client is doing well,  she has lost weight. She is breathing much better and does not need a nebulizer machine at this time. Her abdominal circumference is 49 inches. Per Dr. Doylene Canard, she has lost 1" around her waist.   Hypglycemic episodes: discussed low blood sugars. Per Dr. Doylene Canard, client is to stop Lantus insulin check blood sugars daily, give herself Lantus 5 units if her sugar is above 140.  Education-HF education regarding heart failure started with client. Client reports she has a scale, but her bathroom floor is uneven, so she does not weigh. RNCM encouraged client to move scale to another part of the house.   Plan: telephonic follow up next week.  [name], RN, MSN, [place] Coordinator Cell: [phone]

## 2016-02-21 DIAGNOSIS — I10 Essential (primary) hypertension: Secondary | ICD-10-CM | POA: Diagnosis not present

## 2016-02-21 DIAGNOSIS — N2581 Secondary hyperparathyroidism of renal origin: Secondary | ICD-10-CM | POA: Diagnosis not present

## 2016-02-21 DIAGNOSIS — N186 End stage renal disease: Secondary | ICD-10-CM | POA: Diagnosis not present

## 2016-02-22 ENCOUNTER — Other Ambulatory Visit: Payer: Self-pay

## 2016-02-22 DIAGNOSIS — I10 Essential (primary) hypertension: Secondary | ICD-10-CM | POA: Diagnosis not present

## 2016-02-22 NOTE — Patient Outreach (Signed)
[facility]) Care Management  02/22/2016  [name] August 20, 1945 [id number]   Subjective: "I don't miss my dialysis". "my floors have water damage and they are wopped, so I don't weigh myself".   Objective: none  Assessment: 71 year old with recent admission for heart failure. History of ESRD/HD, diabetes. RNCM called to follow up regarding an EMMI red flag call. Client did not weigh self on yesterday. Client reports she went to dialysis on yesterday as scheduled. Client reports she is weighed every other day in dialysis and does not have a location to move her scales to due to warped floors. RNCM encouraged client to try to find an area where she can move her scales to in order to monitor weights daily.  Plan: transition of care call next week.  [name], RN, MSN, [place] Coordinator Cell: [phone]

## 2016-02-23 DIAGNOSIS — I10 Essential (primary) hypertension: Secondary | ICD-10-CM | POA: Diagnosis not present

## 2016-02-23 DIAGNOSIS — N2581 Secondary hyperparathyroidism of renal origin: Secondary | ICD-10-CM | POA: Diagnosis not present

## 2016-02-23 DIAGNOSIS — N186 End stage renal disease: Secondary | ICD-10-CM | POA: Diagnosis not present

## 2016-02-24 DIAGNOSIS — I10 Essential (primary) hypertension: Secondary | ICD-10-CM | POA: Diagnosis not present

## 2016-02-25 ENCOUNTER — Ambulatory Visit: Payer: Commercial Managed Care - HMO

## 2016-02-25 ENCOUNTER — Other Ambulatory Visit: Payer: Self-pay

## 2016-02-25 DIAGNOSIS — I10 Essential (primary) hypertension: Secondary | ICD-10-CM | POA: Diagnosis not present

## 2016-02-25 NOTE — Patient Outreach (Signed)
[facility]) Care Management  [place]  02/25/2016   [name] 12-18-1945 [id number]  Subjective: client described her blood sugar levels as "low", with blood sugar at 101 yesterday and 112 this morning.  Objective: none  Encounter Medications:  Outpatient Encounter Prescriptions as of 02/25/2016  Medication Sig  . albuterol (PROVENTIL HFA;VENTOLIN HFA) 108 (90 BASE) MCG/ACT inhaler Inhale 2 puffs into the lungs every 4 (four) hours as needed for wheezing or shortness of breath.  Marland Kitchen albuterol (PROVENTIL) (2.5 MG/3ML) 0.083% nebulizer solution Take 3 mLs (2.5 mg total) by nebulization every 6 (six) hours as needed for wheezing or shortness of breath.  Marland Kitchen aspirin EC 81 MG tablet Take 81 mg by mouth daily.    . bisacodyl (DULCOLAX) 5 MG EC tablet Take 10 mg by mouth daily as needed for moderate constipation.   . Calcium Carbonate-Vitamin D (CALCIUM 600 + D PO) Take 1 tablet by mouth daily.   Marland Kitchen ethyl chloride spray Apply 1 application topically daily as needed. For HD  . guaiFENesin-dextromethorphan (ROBITUSSIN DM) 100-10 MG/5ML syrup Take 5 mLs by mouth every 4 (four) hours as needed for cough. (Patient not taking: Reported on 02/20/2016)  . insulin glargine (LANTUS) 100 UNIT/ML injection Inject 5-10 Units into the skin at bedtime. Patient uses sliding scale.  Marland Kitchen levofloxacin (LEVAQUIN) 500 MG tablet Take 1 tablet (500 mg total) by mouth every other day. (Patient not taking: Reported on 02/20/2016)  . midodrine (PROAMATINE) 5 MG tablet Take 1 tablet (5 mg total) by mouth 3 (three) times daily with meals.  . Multiple Vitamin (MULITIVITAMIN WITH MINERALS) TABS Take 1 tablet by mouth daily.  . nitroGLYCERIN (NITROSTAT) 0.4 MG SL tablet Place 0.4 mg under the tongue every 5 (five) minutes as needed for chest pain.  . predniSONE (DELTASONE) 10 MG tablet Take 1 tablet (10 mg total) by mouth daily with breakfast. For 7 days then 1/2 tablet daily x 6 days.  . SENSIPAR 90 MG  tablet Take 90 mg by mouth daily.  . sevelamer carbonate (RENVELA) 800 MG tablet Take 1 tablet (800 mg total) by mouth 3 (three) times daily with meals.   No facility-administered encounter medications on file as of 02/25/2016.     Functional Status:  In your present state of health, do you have any difficulty performing the following activities: 02/15/2016 01/30/2016  Hearing? N N  Vision? Y N  Difficulty concentrating or making decisions? N N  Walking or climbing stairs? Y N  Dressing or bathing? N N  Doing errands, shopping? Y N  Preparing Food and eating ? N -  Using the Toilet? N -  In the past six months, have you accidently leaked urine? N -  Do you have problems with loss of bowel control? N -  Managing your Medications? Y -  Managing your Finances? N -  Housekeeping or managing your Housekeeping? N -  Some recent data might be hidden    Fall/Depression Screening: PHQ 2/9 Scores 02/15/2016 11/29/2015 11/08/2015 09/27/2015 09/18/2015  PHQ - 2 Score 0 0 0 0 0   Fall Risk  02/15/2016 11/29/2015 11/08/2015 09/27/2015  Falls in the past year? No Yes No No  Number falls in past yr: - 1 - -  Injury with Fall? - Yes - -  Follow up - Falls prevention discussed - -   Assessment: denies shortness of breath or edema. Continues with dialysis three times/week. Declined home health services.  Blood sugar levels 101 yesterday  and 112 this morning. RNCM instructed blood sugar levels considered good 80 -120. Reports she has not been taking insulin and continues to check blood sugar levels.  Client reports her scales are not working. RNCM will assist with obtaining scales.  Plan: home visit next week.  [name], RN, MSN, [place] Coordinator Cell: [phone]

## 2016-02-26 DIAGNOSIS — N186 End stage renal disease: Secondary | ICD-10-CM | POA: Diagnosis not present

## 2016-02-26 DIAGNOSIS — I10 Essential (primary) hypertension: Secondary | ICD-10-CM | POA: Diagnosis not present

## 2016-02-26 DIAGNOSIS — N2581 Secondary hyperparathyroidism of renal origin: Secondary | ICD-10-CM | POA: Diagnosis not present

## 2016-02-27 ENCOUNTER — Ambulatory Visit
Admission: RE | Admit: 2016-02-27 | Discharge: 2016-02-27 | Disposition: A | Discharge status: Home / Self Care | Payer: Commercial Managed Care - HMO | Source: Ambulatory Visit | Attending: Internal Medicine | Admitting: Internal Medicine

## 2016-02-27 DIAGNOSIS — I10 Essential (primary) hypertension: Secondary | ICD-10-CM | POA: Diagnosis not present

## 2016-02-27 DIAGNOSIS — R928 Other abnormal and inconclusive findings on diagnostic imaging of breast: Secondary | ICD-10-CM

## 2016-02-27 DIAGNOSIS — N6311 Unspecified lump in the right breast, upper outer quadrant: Secondary | ICD-10-CM | POA: Diagnosis not present

## 2016-02-28 ENCOUNTER — Other Ambulatory Visit: Payer: Self-pay

## 2016-02-28 DIAGNOSIS — N186 End stage renal disease: Secondary | ICD-10-CM | POA: Diagnosis not present

## 2016-02-28 DIAGNOSIS — I10 Essential (primary) hypertension: Secondary | ICD-10-CM | POA: Diagnosis not present

## 2016-02-28 DIAGNOSIS — N2581 Secondary hyperparathyroidism of renal origin: Secondary | ICD-10-CM | POA: Diagnosis not present

## 2016-02-28 NOTE — Patient Outreach (Signed)
[facility] [facility]) Care Management  02/28/2016  [name] May 14, 1945 [id number]   RNCM called to follow up regarding EMMI red flag for call completed yesterday that client did not weigh self. RNCM has discussed with client on previous call. Client reports she does not have scale to weigh at this time. Client is also a dialysis patient and is weighed every Tuesday, Thursday and Saturday. Scales have been requested to send to her house. No answer-Unable to leave message.  Plan: home visit scheduled for next week. Continue to follow.  [name], RN, MSN, [place] Coordinator Cell: [phone]

## 2016-02-29 DIAGNOSIS — I10 Essential (primary) hypertension: Secondary | ICD-10-CM | POA: Diagnosis not present

## 2016-03-01 DIAGNOSIS — I10 Essential (primary) hypertension: Secondary | ICD-10-CM | POA: Diagnosis not present

## 2016-03-01 DIAGNOSIS — N186 End stage renal disease: Secondary | ICD-10-CM | POA: Diagnosis not present

## 2016-03-01 DIAGNOSIS — N2581 Secondary hyperparathyroidism of renal origin: Secondary | ICD-10-CM | POA: Diagnosis not present

## 2016-03-02 DIAGNOSIS — I10 Essential (primary) hypertension: Secondary | ICD-10-CM | POA: Diagnosis not present

## 2016-03-03 ENCOUNTER — Ambulatory Visit: Payer: Self-pay

## 2016-03-03 ENCOUNTER — Other Ambulatory Visit: Payer: Self-pay

## 2016-03-03 DIAGNOSIS — I10 Essential (primary) hypertension: Secondary | ICD-10-CM | POA: Diagnosis not present

## 2016-03-03 NOTE — Patient Outreach (Addendum)
[place] [facility]) Care Management   03/03/2016  [name] 1946-01-08 [id number]  [name] is an 71 y.o. female  Subjective: reports feeling "fine". Denies shortness or breath. States she received the scales and is weighing herself every day.  Objective:  BP 138/78   Pulse 73   Resp 20   Ht 1.651 m (5\' 5" )   Wt 272 lb (123.4 kg)   SpO2 91%   BMI 45.26 kg/m   Review of Systems  Constitutional: Negative.   HENT: Negative.   Eyes:       Legally blind in right eye.  Respiratory: Negative for sputum production and shortness of breath.        Decreased breath sounds bilaterally.  Cardiovascular: Negative.  Negative for leg swelling.  Gastrointestinal: Negative.   Genitourinary: Negative.        HD patient/Does not make urine.  Musculoskeletal: Negative.   Skin: Negative.   Neurological: Negative.   Endo/Heme/Allergies: Negative.   Psychiatric/Behavioral: Negative.     Physical Exam  Encounter Medications:   Outpatient Encounter Prescriptions as of 03/03/2016  Medication Sig Note  . albuterol (PROVENTIL HFA;VENTOLIN HFA) 108 (90 BASE) MCG/ACT inhaler Inhale 2 puffs into the lungs every 4 (four) hours as needed for wheezing or shortness of breath.   Marland Kitchen aspirin EC 81 MG tablet Take 81 mg by mouth daily.     . bisacodyl (DULCOLAX) 5 MG EC tablet Take 10 mg by mouth daily as needed for moderate constipation.    . Calcium Carbonate-Vitamin D (CALCIUM 600 + D PO) Take 1 tablet by mouth daily.    Marland Kitchen docusate sodium (COLACE) 100 MG capsule Take 100 mg by mouth daily.   Marland Kitchen ethyl chloride spray Apply 1 application topically daily as needed. For HD   . guaiFENesin-dextromethorphan (ROBITUSSIN DM) 100-10 MG/5ML syrup Take 5 mLs by mouth every 4 (four) hours as needed for cough.   . metoprolol (LOPRESSOR) 50 MG tablet Take 50 mg by mouth daily.   . midodrine (PROAMATINE) 5 MG tablet Take 1 tablet (5 mg total) by mouth 3 (three) times daily with meals.   . Multiple  Vitamin (MULITIVITAMIN WITH MINERALS) TABS Take 1 tablet by mouth daily.   . nitroGLYCERIN (NITROSTAT) 0.4 MG SL tablet Place 0.4 mg under the tongue every 5 (five) minutes as needed for chest pain.   . SENSIPAR 90 MG tablet Take 90 mg by mouth daily.   . sevelamer carbonate (RENVELA) 800 MG tablet Take 1 tablet (800 mg total) by mouth 3 (three) times daily with meals.   Marland Kitchen albuterol (PROVENTIL) (2.5 MG/3ML) 0.083% nebulizer solution Take 3 mLs (2.5 mg total) by nebulization every 6 (six) hours as needed for wheezing or shortness of breath. (Patient not taking: Reported on 03/03/2016)   . insulin glargine (LANTUS) 100 UNIT/ML injection Inject 5-10 Units into the skin at bedtime. Patient uses sliding scale. 03/03/2016: Per patient If blood sugar goes above 150 she will take 5 units.  Marland Kitchen levofloxacin (LEVAQUIN) 500 MG tablet Take 1 tablet (500 mg total) by mouth every other day. (Patient not taking: Reported on 02/20/2016)   . predniSONE (DELTASONE) 10 MG tablet Take 1 tablet (10 mg total) by mouth daily with breakfast. For 7 days then 1/2 tablet daily x 6 days. (Patient not taking: Reported on 03/03/2016)    No facility-administered encounter medications on file as of 03/03/2016.     Functional Status:   In your present state of health, do you  have any difficulty performing the following activities: 02/15/2016 01/30/2016  Hearing? N N  Vision? Y N  Difficulty concentrating or making decisions? N N  Walking or climbing stairs? Y N  Dressing or bathing? N N  Doing errands, shopping? Y N  Preparing Food and eating ? N -  Using the Toilet? N -  In the past six months, have you accidently leaked urine? N -  Do you have problems with loss of bowel control? N -  Managing your Medications? Y -  Managing your Finances? N -  Housekeeping or managing your Housekeeping? N -  Some recent data might be hidden    Fall/Depression Screening:    PHQ 2/9 Scores 02/15/2016 11/29/2015 11/08/2015 09/27/2015 09/18/2015   PHQ - 2 Score 0 0 0 0 0    Assessment:  71 year old with admitted 1/10-1/23 with heart failure/acute bronchitis. Client is a hemodialysis patient-Tuesday, Thursday and Saturday.  Heart failure-reviewed heart failure zone tool. [name] reports she received the scales and has started weighing herself. RNCM encouraged her to record the weights in the [place]. RNCM also encouraged her to know what her dry weight is for dialysis.  Medications reviewed: client is taking Metoprolol, but not listed on medication list. RNCM will request pharmacy consult for medication reconciliation and medication reconciliation among providers.   Regarding blood sugar. [name] was taken off Lantus insulin on 1/31 per [name]. She is no longer on any hypoglycemic medications. per review of her glucose meter: she has had two readings in the 60's and a couple in the 70's. Low has been 64 on February 2nd and her high has been 180 (per client, after eating cake). She has not been recording blood sugars. Per [name], treats her low blood sugar with eating. [name] states she ate a snack last night before going to bed around 11pm and states her blood sugar was 74 this morning. She denies taking any insulin since seeing [name]. [name] states before going to sleep last night, she ate a ham sandwich around 11 pm and it was 74 this morning. Client reported she ate. RNCM reviewed signs/symptoms of hypoglycemia and how to treat.  Per Meter-Blood sugar the past week:  2/12 5:34 am 74 2/12 1:55 am 168 2/11 6:19 am 73 2/11 2:13 am 180 2/10 4:04 am 97 2/9 9:26 pm 120 2/9 7:54 am 92 2/8 3:55 am 90 2/7 10:40 pm 133 2/7 4:12 am 94 2/6 9:13 pm 124 2/6 3:32 am 156 2/5 11:42 pm 105 2/5 7:38 am 101 2/5 12:20 am 139 2/4 6:36 am 121 2/4 2:24 am 128  03/03/16 6:15 pm NOTE: RNCM followed up call to further discuss low blood sugars. Upon further questioning, [name] stated she has been checking her blood  sugar twice a day and giving herself 5 units of insulin when her blood sugar has gone above 150 (for example she gave herself 5 units Lanuts for the blood sugar of 180 on 2/11 at 2:13 am). RNCM reinforced with client that she was instructed to check her blood sugar daily in the morning before meals and if her blood sugar was above the mark set by [name], then she was supposed to give herself 5 units of insulin. RNCM encouraged client to check blood sugar daily in the morning and as needed. Client encouraged to give self insulin based on that daily reading. Client also informed the blood sugar should go up after eating and  that if blood sugar is 180 or less approximately 2 hours after eating that is ok. RNCM encouraged client to record readings and encouraged to call RNCM or provider if she has any questions.  Plan: continue weekly transition of care call. Update primary care. Pharmacy consult.  [name], RN, MSN, [place] Coordinator Cell: [phone]

## 2016-03-04 ENCOUNTER — Other Ambulatory Visit: Payer: Self-pay

## 2016-03-04 DIAGNOSIS — N2581 Secondary hyperparathyroidism of renal origin: Secondary | ICD-10-CM | POA: Diagnosis not present

## 2016-03-04 DIAGNOSIS — N186 End stage renal disease: Secondary | ICD-10-CM | POA: Diagnosis not present

## 2016-03-04 DIAGNOSIS — I10 Essential (primary) hypertension: Secondary | ICD-10-CM | POA: Diagnosis not present

## 2016-03-04 NOTE — Patient Outreach (Signed)
[place] [facility]) Care Management  03/04/2016  PEARLENE TEAT 1945-05-10 [id number]  Care Coordination-RNCM called primary care office to update regarding home visit. Informed of member's low blood sugar episodes. Also informed of how member had been taking her insulin. RNCM reinforced that RNCM's note has been routed to primary care and can also be located in [facility] chart.  Plan: continue to follow.  [name], RN, MSN, [place] Coordinator Cell: [phone]

## 2016-03-04 NOTE — Patient Outreach (Signed)
[place] [facility]) Care Management  03/04/2016  [name] 11/05/1945 [id number]   Assessment: RNCM received notification that client flagged EMMI red for weight on yesterday- Weight 172 up 2 pounds in one day.  RNCM saw client during a home visit on yesterday. RNCM discussed weights. No issues noted. Client will have dialysis today, therefore, RNCM will not call client today.  Plan: follow up telephonically next week.  [name], RN, MSN, [place] Coordinator Cell: [phone]

## 2016-03-05 DIAGNOSIS — I10 Essential (primary) hypertension: Secondary | ICD-10-CM | POA: Diagnosis not present

## 2016-03-06 DIAGNOSIS — N2581 Secondary hyperparathyroidism of renal origin: Secondary | ICD-10-CM | POA: Diagnosis not present

## 2016-03-06 DIAGNOSIS — I10 Essential (primary) hypertension: Secondary | ICD-10-CM | POA: Diagnosis not present

## 2016-03-06 DIAGNOSIS — N186 End stage renal disease: Secondary | ICD-10-CM | POA: Diagnosis not present

## 2016-03-07 DIAGNOSIS — I10 Essential (primary) hypertension: Secondary | ICD-10-CM | POA: Diagnosis not present

## 2016-03-08 DIAGNOSIS — N186 End stage renal disease: Secondary | ICD-10-CM | POA: Diagnosis not present

## 2016-03-08 DIAGNOSIS — I10 Essential (primary) hypertension: Secondary | ICD-10-CM | POA: Diagnosis not present

## 2016-03-08 DIAGNOSIS — N2581 Secondary hyperparathyroidism of renal origin: Secondary | ICD-10-CM | POA: Diagnosis not present

## 2016-03-09 DIAGNOSIS — I10 Essential (primary) hypertension: Secondary | ICD-10-CM | POA: Diagnosis not present

## 2016-03-10 ENCOUNTER — Other Ambulatory Visit: Payer: Self-pay

## 2016-03-10 ENCOUNTER — Telehealth: Payer: Self-pay | Admitting: Pharmacist

## 2016-03-10 DIAGNOSIS — I10 Essential (primary) hypertension: Secondary | ICD-10-CM | POA: Diagnosis not present

## 2016-03-10 NOTE — Patient Outreach (Signed)
[place] [facility]) Care Management  03/10/2016  ALEIGHNA WOJTAS Jun 19, 1945 [id number]   Care Coordination-RNCM spoke with [facility] pharmacist, Denyse Amass, regarding member's medications.  Plan: Co-visit next week if member is agreeable.  [name], RN, MSN, [place] Coordinator Cell: [phone]

## 2016-03-10 NOTE — Telephone Encounter (Signed)
----- Message from Gaynelle Adu, [facility] sent at 03/06/2016  2:24 PM EST ----- Regarding: FW: Referral: Order for Myrtis Ser   ----- Message ----- From: Jiles Harold Sent: 03/04/2016   9:39 AM To: Gaynelle Adu, [place], Luretha Rued, RN Subject: Referral: Order for [place]  Referral from [name], RN  "Please see below request"  Forde Radon ----- Message ----- From: Luretha Rued, RN Sent: 03/03/2016   6:01 PM To: [name] Cm Communication Orders Subject: Order for ZAYLYNN, RICKETT                       Patient Name: [name], [name] H([id number]) Sex: Female DOB: 24-Jul-1945    PCP: [name]   Center: [facility]   Types of orders made on 03/03/2016: Medications, Nursing  Order Date:03/03/2016 Ordering Urban Gibson [[id number]] Encounter [place], RN [phone] Authorizing Provider: [name], MD [[id number]] Department:[name][[id number]]  Order Specific Information Order: Comm to Pharmacy [Custom: [id number]]  Order #: [id number] Qty: 1   Priority: Routine  Class: Clinic Performed   Comment:Please assign to pharmacist:                         for medication reconciliation among providers. When I went to             clients home today, she has a bottle of Metoprolol ER succinate that             was ordered by Dr. Ardeth Perfect. I did not notice any refills on it. I             did not see that Dr. Doylene Canard or nephrologist has this medication on             their medication list. She reports she is taking this medication.             Please clarify if she is to be taking this and update all providers             involved in her care. Thank you.                        [name], RN, MSN, [place] Coordinator            Cell: [phone]     Priority: Routine  Class: Clinic Performed   Comment:Please assign to pharmacist:                         for medication  reconciliation among providers. When I went to             clients home today, she has a bottle of Metoprolol ER succinate that             was ordered by Dr. Ardeth Perfect. I did not notice any refills on it. I             did not see that Dr. Doylene Canard or nephrologist has this medication on             their medication list. She reports she is taking this medication.             Please clarify if she is to be taking this and  update all providers             involved in her care. Thank you.                        [name], RN, MSN, [place] Coordinator            Cell: [phone]

## 2016-03-10 NOTE — Patient Outreach (Signed)
[place] [facility]) Care Management  03/10/2016  [name] Sep 11, 1945 [id number]  Assessment: RNCM called for transition of care call (Week #3). No answer.    Plan: RNCM will follow up with transition of care call next week.  [name], RN, MSN, [place] Coordinator Cell: [phone]

## 2016-03-10 NOTE — Patient Outreach (Signed)
[place] [facility]) Care Management  03/10/2016  [name] 02/07/1945 [id number]  Patient was called regarding medication assistance as per referral from [place], Denton Brick.  HIPAA identifiers were obtained. The referral requested pharmacy investigate metoprolol and see if patient is supposed to be taking it and then update all of her providers.  Patient has multiple medical conditions including but not limited to:  CHF, OSA on CPAP, CKD stage V on hemodialysis, type 2 diabetes, history of Mi, weakness and tremor.  Unfortunately, the patient said she did not think she could see her medication bottles well enough to review medications with me via telephone.  She requested that I call back when her aid is in the home.  Patient reported, her aid will be with her tomorrow around 1pm.  The following care coordination was completed to assist:  1. Called the Dialysis Center and spoke with Nicki Reaper- the dialysis center does not have metoprolol on their active medication list.  2.  Called Dr. Merrilee Jansky office and left a message for someone to call me back about metoprolol.  3.  [place] pharmacist reported metoprolol succinate 50mg  was last filled 11/19/15 for a 90 day supply.  Metprolol tartate is on the medication list but [facility] reported the succinate being filled last.  Plan:  1.  Call patient back while her aid is there.  [name], PharmD, [place] Clinical Pharmacist [phone]

## 2016-03-11 ENCOUNTER — Other Ambulatory Visit: Payer: Self-pay | Admitting: Pharmacist

## 2016-03-11 DIAGNOSIS — N186 End stage renal disease: Secondary | ICD-10-CM | POA: Diagnosis not present

## 2016-03-11 DIAGNOSIS — I10 Essential (primary) hypertension: Secondary | ICD-10-CM | POA: Diagnosis not present

## 2016-03-11 DIAGNOSIS — N2581 Secondary hyperparathyroidism of renal origin: Secondary | ICD-10-CM | POA: Diagnosis not present

## 2016-03-11 NOTE — Patient Outreach (Signed)
[place] [facility]) Care Management  03/11/2016  DARNISHA VERNET [birth year] [id number]   Patient was called regarding medication management and metoprolol. No answer and her voicemail did not allow for a message to be left.    Dr. Hoover Brunette office was called to inquire about metoprolol and a message was left on his CMA, Christina's voicemail.    Plan:  1.  Call the patient back within 1-2 business days.  2.  Await a phone call from Dr. Hoover Brunette office.    3.  I will call Dr. Hoover Brunette office if they do not call back within 24 hours.  [name], PharmD, [place] Clinical Pharmacist [phone]

## 2016-03-12 ENCOUNTER — Other Ambulatory Visit: Payer: Self-pay | Admitting: Pharmacist

## 2016-03-12 DIAGNOSIS — I10 Essential (primary) hypertension: Secondary | ICD-10-CM | POA: Diagnosis not present

## 2016-03-12 NOTE — Patient Outreach (Signed)
[place] [facility]) Care Management  03/12/2016  [name] 04/30/1945 [id number]   [name] from Dr. Hoover Brunette office left a voicemail on 03/11/16 confirming that their office did not have metoprolol on the patient's active medication list.   Plan:  1. Call patient today.  2.  Home visit scheduled with [name], RN with patient 03/17/16.   [name], PharmD, [place] Clinical Pharmacist [phone]

## 2016-03-12 NOTE — Patient Outreach (Signed)
[place] [facility]) Care Management  03/12/2016  TREACY HOLCOMB 01/07/1946 [id number]   Patient called regarding medication management and about metoprolol.  No answer and patient's voicemail memory is full. I was unable to leave a message.  Plan:  Call the patient back later today.    Attempt home visit with [facility] Nurse, [name] on 03/17/16.  [name], PharmD, [place] Clinical Pharmacist [phone]

## 2016-03-13 ENCOUNTER — Other Ambulatory Visit: Payer: Self-pay | Admitting: Pharmacist

## 2016-03-13 ENCOUNTER — Other Ambulatory Visit: Payer: Self-pay

## 2016-03-13 DIAGNOSIS — I10 Essential (primary) hypertension: Secondary | ICD-10-CM | POA: Diagnosis not present

## 2016-03-13 DIAGNOSIS — N186 End stage renal disease: Secondary | ICD-10-CM | POA: Diagnosis not present

## 2016-03-13 DIAGNOSIS — N2581 Secondary hyperparathyroidism of renal origin: Secondary | ICD-10-CM | POA: Diagnosis not present

## 2016-03-13 NOTE — Patient Outreach (Signed)
[place] [facility]) Care Management  03/13/2016  CARLIS BURNSWORTH 1945-10-22 [id number]  71 year old with recent admission currently in the transition of care program. RNCM called to follow up and confirm home visit for next week. No answer. Unable to leave message.  Plan: co-visit with [name] pharmacit next week. RNCM will continue to follow.  [name], RN, MSN, [place] Coordinator Cell: [phone]

## 2016-03-13 NOTE — Patient Outreach (Signed)
[name] [facility]) Care Management  03/13/2016  [name] 1946-01-20 [id number]   [name] from Dr. Merrilee Jansky office called back to confirm that they do not have metoprolol on the patient's medication list in their records.  Plan:  Attend home visit with the patient 03/17/16 with [name] Nurse, [name].  [name], PharmD, [place] Clinical Pharmacist [phone]

## 2016-03-14 DIAGNOSIS — I10 Essential (primary) hypertension: Secondary | ICD-10-CM | POA: Diagnosis not present

## 2016-03-14 DIAGNOSIS — Z4421 Encounter for fitting and adjustment of artificial right eye: Secondary | ICD-10-CM | POA: Diagnosis not present

## 2016-03-15 DIAGNOSIS — N186 End stage renal disease: Secondary | ICD-10-CM | POA: Diagnosis not present

## 2016-03-15 DIAGNOSIS — N2581 Secondary hyperparathyroidism of renal origin: Secondary | ICD-10-CM | POA: Diagnosis not present

## 2016-03-15 DIAGNOSIS — I10 Essential (primary) hypertension: Secondary | ICD-10-CM | POA: Diagnosis not present

## 2016-03-16 DIAGNOSIS — I10 Essential (primary) hypertension: Secondary | ICD-10-CM | POA: Diagnosis not present

## 2016-03-17 ENCOUNTER — Other Ambulatory Visit: Payer: Self-pay

## 2016-03-17 ENCOUNTER — Ambulatory Visit: Payer: Self-pay | Admitting: Pharmacist

## 2016-03-17 ENCOUNTER — Ambulatory Visit: Payer: Commercial Managed Care - HMO | Admitting: Pharmacist

## 2016-03-17 ENCOUNTER — Other Ambulatory Visit: Payer: Self-pay | Admitting: Pharmacist

## 2016-03-17 ENCOUNTER — Ambulatory Visit: Payer: Self-pay

## 2016-03-17 DIAGNOSIS — I10 Essential (primary) hypertension: Secondary | ICD-10-CM | POA: Diagnosis not present

## 2016-03-17 NOTE — Patient Outreach (Signed)
[place] [facility]) Care Management  03/17/2016  [name] Jan 28, 1945 [id number]   Patient was called to confirm our up coming home visit. Unfortunately, I was not able to get in touch with the patient when the appointment was scheduled. [name] did not answer today either and her voicemail did not pick up.  I also spoke with [facility] Nurse, Denton Brick, who also could not get in touch with the patient.   Plan:  1. Call patient to reschedule home visit.   [name], PharmD, [place] Clinical Pharmacist [phone]

## 2016-03-17 NOTE — Patient Outreach (Signed)
[place] [facility]) Care Management  03/17/2016  [name] 1945-12-12 [id number]  RNCM called to confirm home visit/transition of care. No answer. Voice message continues to say that voice mail is full. RNCM is unable to leave voice message. Therefore, RNCM will cancel home visit on the schedule. This is the 3rd attempt to reach client. [facility] Pharmacist has also been trying to get in contact with client without success.  Plan: per policy/procedure RNCM will send an outreach letter.  [name], RN, MSN, [place] Coordinator Cell: [phone]

## 2016-03-17 NOTE — Patient Outreach (Signed)
[place] [facility] [place] [facility]) Care Management  03/17/2016  [name] June 23, 1945 [id number]  Late entry for 03/14/16.  RNCM called regarding transition of care and to confirm home visit appointment. No answer. Unable to leave message due to voice mail full.  Plan: Continue care coordination with [facility] pharmacist. RNCM will follow up call next week to confirm appointment.  [name], RN, MSN, [place] Coordinator Cell: [phone]

## 2016-03-17 NOTE — Telephone Encounter (Signed)
This encounter was created in error - please disregard.

## 2016-03-18 ENCOUNTER — Other Ambulatory Visit: Payer: Self-pay

## 2016-03-18 DIAGNOSIS — I10 Essential (primary) hypertension: Secondary | ICD-10-CM | POA: Diagnosis not present

## 2016-03-18 DIAGNOSIS — N186 End stage renal disease: Secondary | ICD-10-CM | POA: Diagnosis not present

## 2016-03-18 DIAGNOSIS — N2581 Secondary hyperparathyroidism of renal origin: Secondary | ICD-10-CM | POA: Diagnosis not present

## 2016-03-18 NOTE — Patient Outreach (Signed)
[place] [facility]) Care Management  03/18/2016  [name] 28-Jan-1945 [id number]  Received notification re: EMMI red flag for "lost interest in things they used to enjoy". RNCM called to follow up. Received unavailable message/mail box is full. This is the fourth attempt to reach client. RNCM sent an outreach letter yesterday.   Plan: per outreach letter protocol-await return call and if no response in 10 days close case.  [name], RN, MSN, [place] Coordinator Cell: [phone]

## 2016-03-19 ENCOUNTER — Ambulatory Visit: Payer: Commercial Managed Care - HMO | Admitting: Pharmacist

## 2016-03-19 DIAGNOSIS — N186 End stage renal disease: Secondary | ICD-10-CM | POA: Diagnosis not present

## 2016-03-19 DIAGNOSIS — E1129 Type 2 diabetes mellitus with other diabetic kidney complication: Secondary | ICD-10-CM | POA: Diagnosis not present

## 2016-03-19 DIAGNOSIS — I10 Essential (primary) hypertension: Secondary | ICD-10-CM | POA: Diagnosis not present

## 2016-03-19 DIAGNOSIS — Z992 Dependence on renal dialysis: Secondary | ICD-10-CM | POA: Diagnosis not present

## 2016-03-20 DIAGNOSIS — I10 Essential (primary) hypertension: Secondary | ICD-10-CM | POA: Diagnosis not present

## 2016-03-20 DIAGNOSIS — N2581 Secondary hyperparathyroidism of renal origin: Secondary | ICD-10-CM | POA: Diagnosis not present

## 2016-03-20 DIAGNOSIS — N186 End stage renal disease: Secondary | ICD-10-CM | POA: Diagnosis not present

## 2016-03-21 DIAGNOSIS — I10 Essential (primary) hypertension: Secondary | ICD-10-CM | POA: Diagnosis not present

## 2016-03-22 DIAGNOSIS — N186 End stage renal disease: Secondary | ICD-10-CM | POA: Diagnosis not present

## 2016-03-22 DIAGNOSIS — I10 Essential (primary) hypertension: Secondary | ICD-10-CM | POA: Diagnosis not present

## 2016-03-22 DIAGNOSIS — N2581 Secondary hyperparathyroidism of renal origin: Secondary | ICD-10-CM | POA: Diagnosis not present

## 2016-03-23 DIAGNOSIS — I10 Essential (primary) hypertension: Secondary | ICD-10-CM | POA: Diagnosis not present

## 2016-03-24 ENCOUNTER — Other Ambulatory Visit: Payer: Self-pay

## 2016-03-24 DIAGNOSIS — I10 Essential (primary) hypertension: Secondary | ICD-10-CM | POA: Diagnosis not present

## 2016-03-24 NOTE — Patient Outreach (Signed)
[place] [facility]) Care Management  03/24/2016  [name] 02-07-1945 [id number]   Received notification: EMMI red flag: weight 281, new or worsening problem.   RNCM called to follow up. Client reports her weight is down to 174 today. Client reports her weight before dialysis on Saturday was 281 and that 6 pounds were taken off of her. Denies shortness of breath, denies edema and is without any complaints.   RNCM discussed knowing weights from pre/post dialysis. RNCM discussed parameter's for call the doctor. Client reports she is going to begin keeping a record of her dialysis weights also. Dialysis scheduled for tomorrow.  Plan: home visit scheduled.  [name], RN, MSN, [place] Coordinator Cell: [phone]

## 2016-03-25 ENCOUNTER — Other Ambulatory Visit: Payer: Self-pay

## 2016-03-25 DIAGNOSIS — I10 Essential (primary) hypertension: Secondary | ICD-10-CM | POA: Diagnosis not present

## 2016-03-25 DIAGNOSIS — N2581 Secondary hyperparathyroidism of renal origin: Secondary | ICD-10-CM | POA: Diagnosis not present

## 2016-03-25 DIAGNOSIS — N186 End stage renal disease: Secondary | ICD-10-CM | POA: Diagnosis not present

## 2016-03-25 NOTE — Patient Outreach (Signed)
[place] [facility]) Care Management  03/25/2016  [name] 1945/12/04 [id number]   RNCM received EMMI red flag notification regarding client's weight yesterday 177 pounds. RNCM spoke with client on yesterday regarding her weight and she was scheduled for dialysis today, therefore RNCM will not call today.  [name], RN, MSN, [place] Coordinator Cell: [phone]

## 2016-03-25 NOTE — Telephone Encounter (Signed)
This encounter was created in error - please disregard.

## 2016-03-26 DIAGNOSIS — I10 Essential (primary) hypertension: Secondary | ICD-10-CM | POA: Diagnosis not present

## 2016-03-27 DIAGNOSIS — N2581 Secondary hyperparathyroidism of renal origin: Secondary | ICD-10-CM | POA: Diagnosis not present

## 2016-03-27 DIAGNOSIS — I10 Essential (primary) hypertension: Secondary | ICD-10-CM | POA: Diagnosis not present

## 2016-03-27 DIAGNOSIS — N186 End stage renal disease: Secondary | ICD-10-CM | POA: Diagnosis not present

## 2016-03-28 DIAGNOSIS — I10 Essential (primary) hypertension: Secondary | ICD-10-CM | POA: Diagnosis not present

## 2016-03-29 DIAGNOSIS — I10 Essential (primary) hypertension: Secondary | ICD-10-CM | POA: Diagnosis not present

## 2016-03-29 DIAGNOSIS — N186 End stage renal disease: Secondary | ICD-10-CM | POA: Diagnosis not present

## 2016-03-29 DIAGNOSIS — N2581 Secondary hyperparathyroidism of renal origin: Secondary | ICD-10-CM | POA: Diagnosis not present

## 2016-03-30 DIAGNOSIS — I10 Essential (primary) hypertension: Secondary | ICD-10-CM | POA: Diagnosis not present

## 2016-03-31 ENCOUNTER — Other Ambulatory Visit: Payer: Self-pay | Admitting: Pharmacist

## 2016-03-31 ENCOUNTER — Encounter: Payer: Self-pay | Admitting: Pharmacist

## 2016-03-31 ENCOUNTER — Other Ambulatory Visit: Payer: Self-pay

## 2016-03-31 DIAGNOSIS — I10 Essential (primary) hypertension: Secondary | ICD-10-CM | POA: Diagnosis not present

## 2016-03-31 NOTE — Patient Outreach (Signed)
[facility]) Care Management  03/31/2016  [name] 05-19-1945 [id number]   Care Coordination-RNCM called to Dr. Doylene Canard regarding clarification of insulin dosing and statin clarification. Dr. Doylene Canard was on speaker phone to instruct client. Client received instructions to check blood sugars Twice a day- take 5 units of Lantus if BS greater than 150; if greater than 200 take 10 units lantus. See Pharmacy note.  [name], RN, MSN, [place] Coordinator Cell: [phone]

## 2016-03-31 NOTE — Patient Outreach (Signed)
[place] [facility]) Care Management  03/31/2016  [name] 05-19-1945 [id number]   While completing a home visit with patient, it was discovered she was taking both atorvastatin 40mg  and rosuvastatin 10mg .  A voicemail was left on Dr. Hoover Brunette CMA's voicemail.    In addition, Dr. Merrilee Jansky office was also called during the visit.  Dr. Doylene Canard discontinued Rosuvastatin.  Dr. Hoover Brunette office called back, Cyril Mourning agreed with discontinuing rosuvastatin.   [name], PharmD, [place] Clinical Pharmacist [phone]

## 2016-03-31 NOTE — Patient Outreach (Addendum)
[facility]) Care Management  [place]   03/31/2016  [name] [birth year] [id number]  Subjective: Home visit completed at patient's home along with [facility] Nurse, [name].  HIPAA identifiers were obtained.  Patient has multiple medical conditions including but not limited to:  CHF, OSA on CPAP, CKD stage V on hemodialysis, type 2 diabetes, history of MI, right eye blindness, weakness, and tremor.  Patient reported her "play daughter" fills her medication boxes. Patient had two boxes one box with AM and PM slots and one box with four slots per day ( Am, NOON, Evening, Bedtime).  Patient used the four slot box as a two slot box.    Objective:  B/P 138/64, pulse 77, weight today: 272.8 pounds   Encounter Medications: Outpatient Encounter Prescriptions as of 03/31/2016  Medication Sig Note  . albuterol (PROVENTIL HFA;VENTOLIN HFA) 108 (90 BASE) MCG/ACT inhaler Inhale 2 puffs into the lungs every 4 (four) hours as needed for wheezing or shortness of breath.   Marland Kitchen aspirin EC 81 MG tablet Take 81 mg by mouth daily.     Marland Kitchen atorvastatin (LIPITOR) 40 MG tablet Take 40 mg by mouth daily.   . bisacodyl (DULCOLAX) 5 MG EC tablet Take 10 mg by mouth daily as needed for moderate constipation.    . Calcium Carbonate-Vitamin D (CALCIUM 600 + D PO) Take 1 tablet by mouth daily.    . diphenhydrAMINE (BENADRYL) 25 mg capsule Take 50 mg by mouth every 6 (six) hours as needed.   . docusate sodium (COLACE) 100 MG capsule Take 100 mg by mouth daily.   Marland Kitchen ethyl chloride spray Apply 1 application topically daily as needed. For HD   . insulin glargine (LANTUS) 100 UNIT/ML injection Inject 5-10 Units into the skin at bedtime. Patient uses sliding scale. 03/03/2016: Per patient If blood sugar goes above 150 she will take 5 units.  . metoprolol (LOPRESSOR) 50 MG tablet Take 50 mg by mouth daily. 03/13/2016: All patient's providers have confirmed she should not be taking  Metoprolol.  . midodrine (PROAMATINE) 5 MG tablet Take 1 tablet (5 mg total) by mouth 3 (three) times daily with meals. 03/31/2016: Patient reports taking midodrine twice daily  . Multiple Vitamin (MULITIVITAMIN WITH MINERALS) TABS Take 1 tablet by mouth daily.   . nitroGLYCERIN (NITROSTAT) 0.4 MG SL tablet Place 0.4 mg under the tongue every 5 (five) minutes as needed for chest pain.   . rosuvastatin (CRESTOR) 10 MG tablet Take 5 mg by mouth daily. 03/31/2016: Patient had 1 whole tablet (10mg ) in her daily medication pill box.  . SENSIPAR 90 MG tablet Take 90 mg by mouth daily.   . sevelamer carbonate (RENVELA) 800 MG tablet Take 1 tablet (800 mg total) by mouth 3 (three) times daily with meals.   Marland Kitchen albuterol (PROVENTIL) (2.5 MG/3ML) 0.083% nebulizer solution Take 3 mLs (2.5 mg total) by nebulization every 6 (six) hours as needed for wheezing or shortness of breath. (Patient not taking: Reported on 03/03/2016)   . [DISCONTINUED] guaiFENesin-dextromethorphan (ROBITUSSIN DM) 100-10 MG/5ML syrup Take 5 mLs by mouth every 4 (four) hours as needed for cough. (Patient not taking: Reported on 03/31/2016)   . [DISCONTINUED] levofloxacin (LEVAQUIN) 500 MG tablet Take 1 tablet (500 mg total) by mouth every other day. (Patient not taking: Reported on 02/20/2016)   . [DISCONTINUED] predniSONE (DELTASONE) 10 MG tablet Take 1 tablet (10 mg total) by mouth daily with breakfast. For 7 days then 1/2 tablet daily x  6 days. (Patient not taking: Reported on 03/03/2016)    No facility-administered encounter medications on file as of 03/31/2016.     Functional Status: In your present state of health, do you have any difficulty performing the following activities: 02/15/2016 01/30/2016  Hearing? N N  Vision? Y N  Difficulty concentrating or making decisions? N N  Walking or climbing stairs? Y N  Dressing or bathing? N N  Doing errands, shopping? Y N  Preparing Food and eating ? N -  Using the Toilet? N -  In the past six  months, have you accidently leaked urine? N -  Do you have problems with loss of bowel control? N -  Managing your Medications? Y -  Managing your Finances? N -  Housekeeping or managing your Housekeeping? N -  Some recent data might be hidden    Fall/Depression Screening: PHQ 2/9 Scores 02/15/2016 11/29/2015 11/08/2015 09/27/2015 09/18/2015  PHQ - 2 Score 0 0 0 0 0    Assessment:  Medications were reviewed ( actual medication bottles as well as the patient's pill box)   Drugs sorted by system:  Neurologic/Psychologic:  Cardiovascular: Aspirin Nitroglycerin Metoprolol ER 50mg  (confirmed the patient is not supposed to be taking this by all providers but patient is still taking) Midodrine- medication list states Midodrine 5mg  1 tablet three times daily--patient is only taking midodrine once daily Rosuvastatin-(not on the medication list but the patient reported taking and was in her pill box) Atorvastatin-(not on the medication list but the patient reported taking and was in her pill box)  Pulmonary/Allergy: Albuterol HFA Albuterol Nebulizer solution-patient reported she does not use and does not have a nebulizer Diphenhydramine-   GI Docusate Sodium Bisacodyl (only PRN)   Endocrine: Lantus- 5 units at bedtime only when blood sugar is >150mg /dl   Renal: Sensipar Renvella  Topical: Ethyl Chloride Spray  Vitamins/Minerals: Multivitamin Calcium Carbonate/Vitamin D   Medication Findings:  Duplications in therapy:  Atorvastatin/rosuvastatin-both medications are statins and the patient should not need both.  The combination of these medications can increase patient's risk of muscle aches and fatigue. (both medications were not on the medication list in the patient's chart.  Both medications were in the patient's possession and were seen in her pill box.)  Omitted medications: Metoprolol ER 50mg - not on the medication list at dialysis, Dr. Merrilee Jansky office and Dr.  Hoover Brunette office. Spoke with [name] at Dr. Hoover Brunette office, Metoprolol officially discontinued.  Diphenhydramine-patient reported taking two diphenhydramine capsules before she goes to dialysis--added to medication list.    Plan:  1.  After speaking with Dr. Doylene Canard and Cyril Mourning from Dr. Hoover Brunette office, Rosuvastatin was officially discontinued.  Metoprolol was also discontinued and both were removed from the patient's pill box.  2. Follow up with the patient during her next home visit with [facility] Nurse, [place] (04/30/16)  3.  Called [facility] and asked them to void all future refills of Metoprolol and Rosuvastatin  [name], PharmD, [place] Clinical Pharmacist [phone]

## 2016-03-31 NOTE — Patient Outreach (Signed)
[facility] Care Management   03/31/2016  [name] [birth year] [id number]  [name] is an 71 y.o. female  Subjective: Client reports she is doing good. Denies any shortness of breath or increased swelling of extremities.  Objective:  BP 138/64   Pulse 77   Resp 20   SpO2 95%  Review of Systems  Eyes:       Legally blind Right eye.  Respiratory:       Lungs sounds decreased.  Cardiovascular:       1+edema, trace pitting to lower extremities, bilaterally. Client reports this is normal for her.  Musculoskeletal:       Left knee aching reports due to weather today.    Physical Exam  Encounter Medications:   Outpatient Encounter Prescriptions as of 03/31/2016  Medication Sig Note  . albuterol (PROVENTIL HFA;VENTOLIN HFA) 108 (90 BASE) MCG/ACT inhaler Inhale 2 puffs into the lungs every 4 (four) hours as needed for wheezing or shortness of breath.   Marland Kitchen albuterol (PROVENTIL) (2.5 MG/3ML) 0.083% nebulizer solution Take 3 mLs (2.5 mg total) by nebulization every 6 (six) hours as needed for wheezing or shortness of breath. (Patient not taking: Reported on 03/03/2016)   . aspirin EC 81 MG tablet Take 81 mg by mouth daily.     . bisacodyl (DULCOLAX) 5 MG EC tablet Take 10 mg by mouth daily as needed for moderate constipation.    . Calcium Carbonate-Vitamin D (CALCIUM 600 + D PO) Take 1 tablet by mouth daily.    Marland Kitchen docusate sodium (COLACE) 100 MG capsule Take 100 mg by mouth daily.   Marland Kitchen ethyl chloride spray Apply 1 application topically daily as needed. For HD   . insulin glargine (LANTUS) 100 UNIT/ML injection Inject 5-10 Units into the skin at bedtime. Patient uses sliding scale. 03/03/2016: Per patient If blood sugar goes above 150 she will take 5 units.  . metoprolol (LOPRESSOR) 50 MG tablet Take 50 mg by mouth daily. 03/13/2016: All patient's providers have confirmed she should not be taking Metoprolol.  . midodrine (PROAMATINE) 5 MG tablet Take 1 tablet (5 mg  total) by mouth 3 (three) times daily with meals. 03/31/2016: Patient reports taking midodrine twice daily  . Multiple Vitamin (MULITIVITAMIN WITH MINERALS) TABS Take 1 tablet by mouth daily.   . nitroGLYCERIN (NITROSTAT) 0.4 MG SL tablet Place 0.4 mg under the tongue every 5 (five) minutes as needed for chest pain.   . SENSIPAR 90 MG tablet Take 90 mg by mouth daily.   . sevelamer carbonate (RENVELA) 800 MG tablet Take 1 tablet (800 mg total) by mouth 3 (three) times daily with meals.    No facility-administered encounter medications on file as of 03/31/2016.     Functional Status:   In your present state of health, do you have any difficulty performing the following activities: 02/15/2016 01/30/2016  Hearing? N N  Vision? Y N  Difficulty concentrating or making decisions? N N  Walking or climbing stairs? Y N  Dressing or bathing? N N  Doing errands, shopping? Y N  Preparing Food and eating ? N -  Using the Toilet? N -  In the past six months, have you accidently leaked urine? N -  Do you have problems with loss of bowel control? N -  Managing your Medications? Y -  Managing your Finances? N -  Housekeeping or managing your Housekeeping? N -  Some recent data might be hidden    Fall/Depression Screening:  PHQ 2/9 Scores 02/15/2016 11/29/2015 11/08/2015 09/27/2015 09/18/2015  PHQ - 2 Score 0 0 0 0 0   Fall Risk  02/15/2016 11/29/2015 11/08/2015 09/27/2015  Falls in the past year? No Yes No No  Number falls in past yr: - 1 - -  Injury with Fall? - Yes - -  Follow up - Falls prevention discussed - -    Assessment:  71 year old with history of ESRD/HD, heart failure, COPD, diabetes. Hospitalized 1/10-1/23 with acute on chronic heart failure and acute bronchitis. RNCM conducted co-visit with [facility] pharmacist, Denyse Amass.  Client's home health assistant in the home today. Client reports she is doing well.  Re: heart failure-client is weighing self and recording weights. Denies any  signs/symptoms of heart failure exacerbation.  Re: bronchitis-client denies any shortness of breath, denies cough. Without questions or concerns.  Medications reviewed per [facility] pharmacist.  Call to Dr. Doylene Canard regarding clarification of insulin dosing and statin clarification. Dr. Doylene Canard was on speaker phone to instruct client. Client received instructions to check blood sugars Twice a day-can take 5 units Lantus if BS greater than 150; if greater than 200 take 10 units lantus.  Plan: Home visit next month. Continue reinforcement of heart failure education; diabetes management follow up.  [name], RN, MSN, [place] Coordinator Cell: [phone]

## 2016-04-01 DIAGNOSIS — N186 End stage renal disease: Secondary | ICD-10-CM | POA: Diagnosis not present

## 2016-04-01 DIAGNOSIS — I10 Essential (primary) hypertension: Secondary | ICD-10-CM | POA: Diagnosis not present

## 2016-04-01 DIAGNOSIS — N2581 Secondary hyperparathyroidism of renal origin: Secondary | ICD-10-CM | POA: Diagnosis not present

## 2016-04-02 DIAGNOSIS — I10 Essential (primary) hypertension: Secondary | ICD-10-CM | POA: Diagnosis not present

## 2016-04-03 DIAGNOSIS — I10 Essential (primary) hypertension: Secondary | ICD-10-CM | POA: Diagnosis not present

## 2016-04-03 DIAGNOSIS — N186 End stage renal disease: Secondary | ICD-10-CM | POA: Diagnosis not present

## 2016-04-03 DIAGNOSIS — N2581 Secondary hyperparathyroidism of renal origin: Secondary | ICD-10-CM | POA: Diagnosis not present

## 2016-04-04 DIAGNOSIS — I10 Essential (primary) hypertension: Secondary | ICD-10-CM | POA: Diagnosis not present

## 2016-04-05 DIAGNOSIS — N2581 Secondary hyperparathyroidism of renal origin: Secondary | ICD-10-CM | POA: Diagnosis not present

## 2016-04-05 DIAGNOSIS — N186 End stage renal disease: Secondary | ICD-10-CM | POA: Diagnosis not present

## 2016-04-05 DIAGNOSIS — I10 Essential (primary) hypertension: Secondary | ICD-10-CM | POA: Diagnosis not present

## 2016-04-06 DIAGNOSIS — I10 Essential (primary) hypertension: Secondary | ICD-10-CM | POA: Diagnosis not present

## 2016-04-07 DIAGNOSIS — I10 Essential (primary) hypertension: Secondary | ICD-10-CM | POA: Diagnosis not present

## 2016-04-08 DIAGNOSIS — N186 End stage renal disease: Secondary | ICD-10-CM | POA: Diagnosis not present

## 2016-04-08 DIAGNOSIS — I10 Essential (primary) hypertension: Secondary | ICD-10-CM | POA: Diagnosis not present

## 2016-04-08 DIAGNOSIS — N2581 Secondary hyperparathyroidism of renal origin: Secondary | ICD-10-CM | POA: Diagnosis not present

## 2016-04-09 DIAGNOSIS — I10 Essential (primary) hypertension: Secondary | ICD-10-CM | POA: Diagnosis not present

## 2016-04-10 DIAGNOSIS — N186 End stage renal disease: Secondary | ICD-10-CM | POA: Diagnosis not present

## 2016-04-10 DIAGNOSIS — I10 Essential (primary) hypertension: Secondary | ICD-10-CM | POA: Diagnosis not present

## 2016-04-10 DIAGNOSIS — N2581 Secondary hyperparathyroidism of renal origin: Secondary | ICD-10-CM | POA: Diagnosis not present

## 2016-04-11 DIAGNOSIS — I10 Essential (primary) hypertension: Secondary | ICD-10-CM | POA: Diagnosis not present

## 2016-04-12 DIAGNOSIS — I10 Essential (primary) hypertension: Secondary | ICD-10-CM | POA: Diagnosis not present

## 2016-04-12 DIAGNOSIS — N2581 Secondary hyperparathyroidism of renal origin: Secondary | ICD-10-CM | POA: Diagnosis not present

## 2016-04-12 DIAGNOSIS — N186 End stage renal disease: Secondary | ICD-10-CM | POA: Diagnosis not present

## 2016-04-12 IMAGING — CT CT HEAD W/O CM
2 series · 15 of 30 positions shown, 17 images · non-contrast
Comparison: None.

CLINICAL DATA: Slurred speech and dizziness for 1 day.

EXAM:
CT HEAD WITHOUT CONTRAST
TECHNIQUE: Contiguous axial images were obtained from the base of the skull
through the vertex without intravenous contrast.

[Series 2: head without · axial · non-contrast · 0.42mm/px · z∈[-49,+56]mm · 7 of 29 slices shown, 9 images]
[im 4/29  brain]
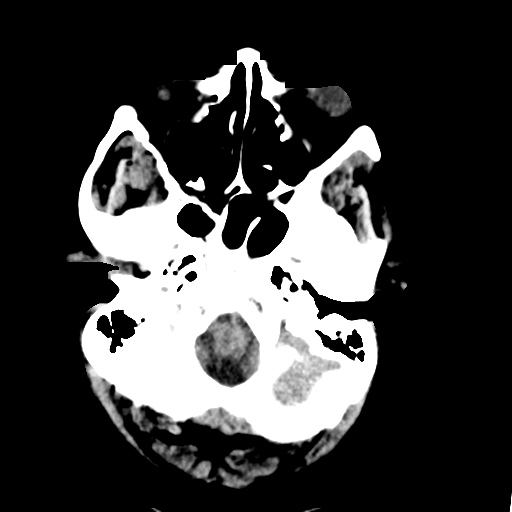
[im 4/29  bone]
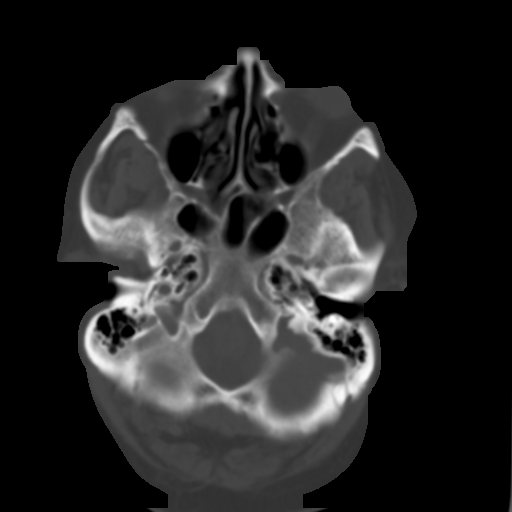
[im 8/29  brain]
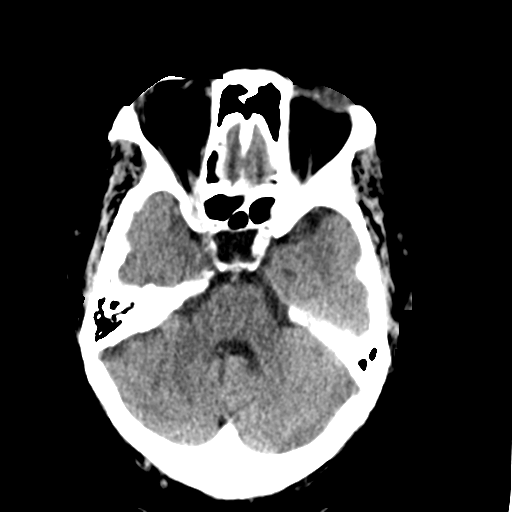
[im 11/29  brain]
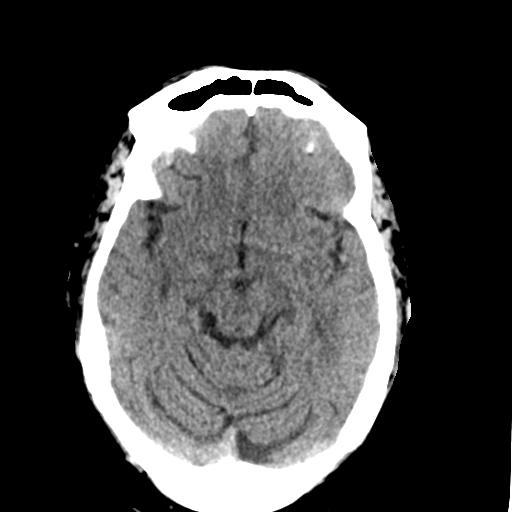
[im 15/29  brain]
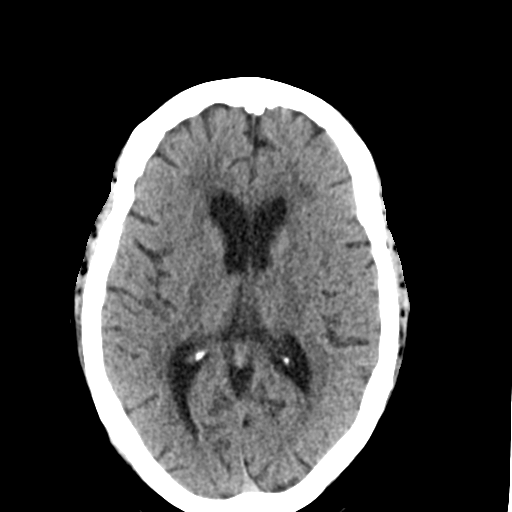
[im 18/29  brain]
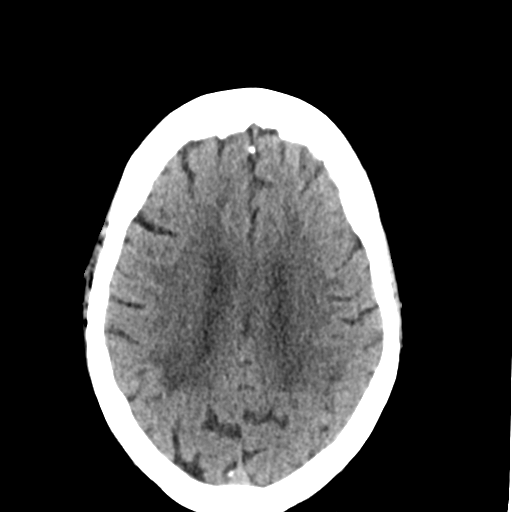
[im 18/29  bone]
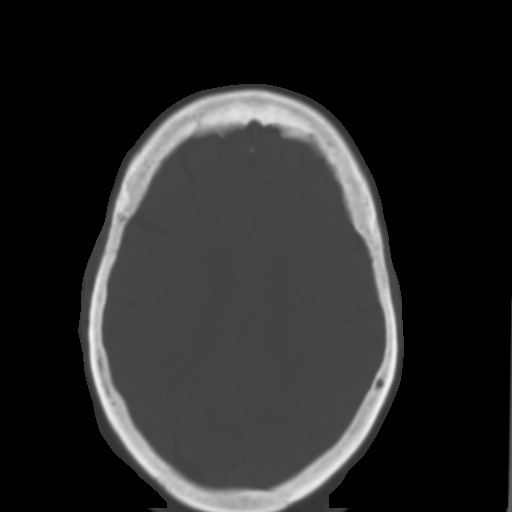
[im 22/29  brain]
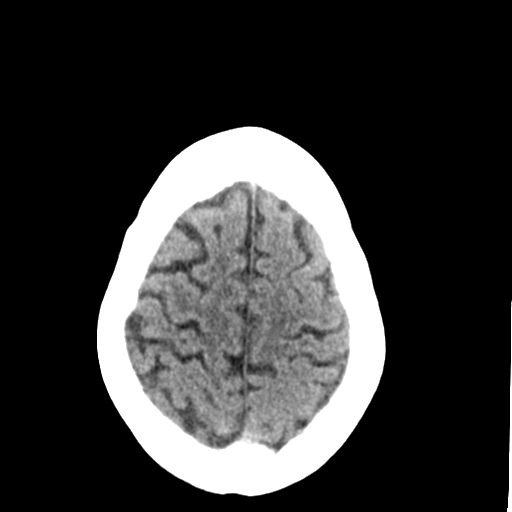
[im 25/29  brain]
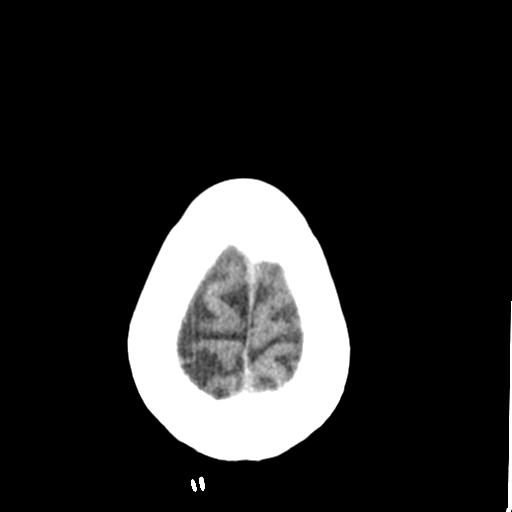

[Series 3: head bone · axial · 0.42mm/px · z∈[-50,+62]mm · 8 of 71 slices shown]
[im 8/71  bone]
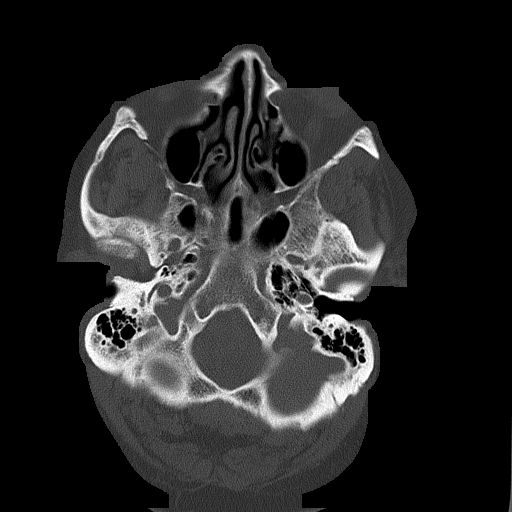
[im 15/71  bone]
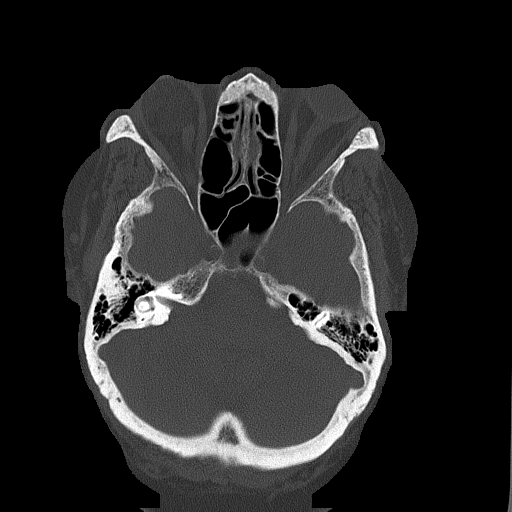
[im 22/71  bone]
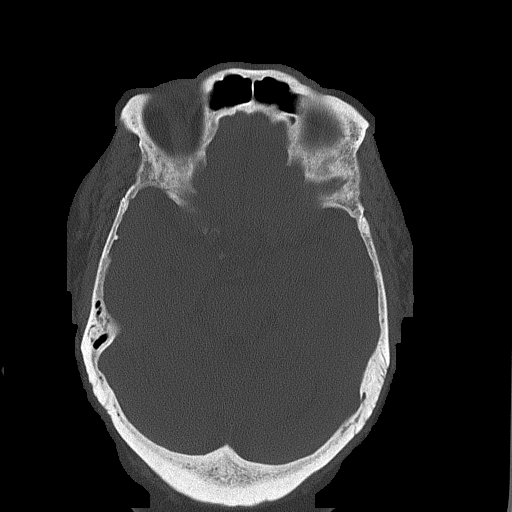
[im 32/71  bone]
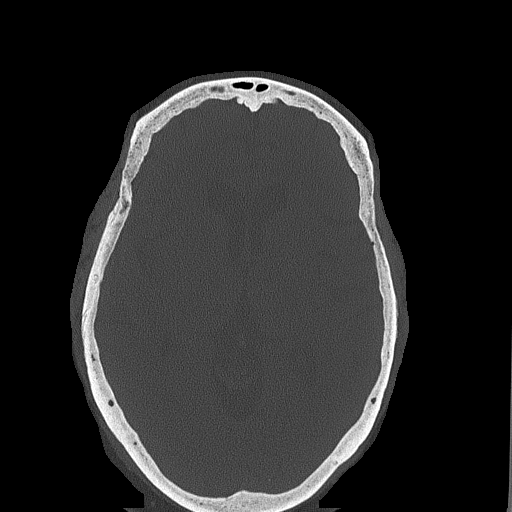
[im 39/71  bone]
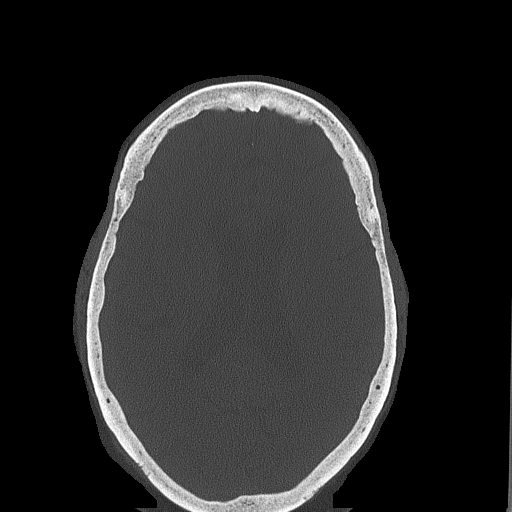
[im 50/71  bone]
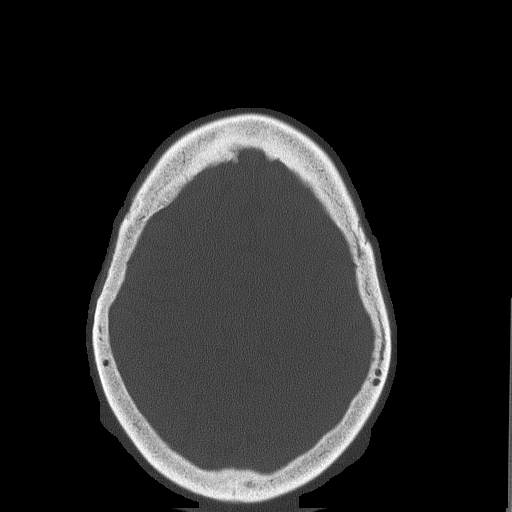
[im 57/71  bone]
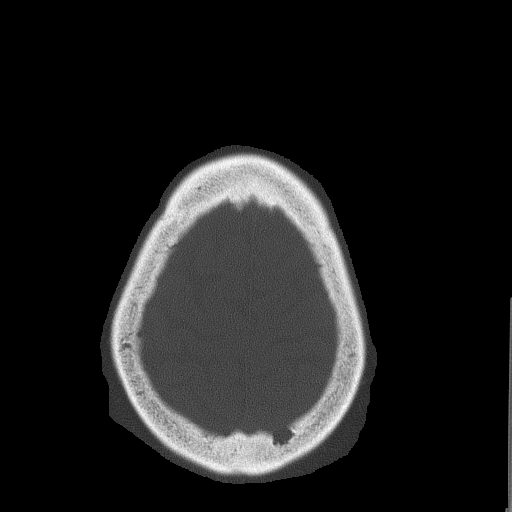
[im 64/71  bone]
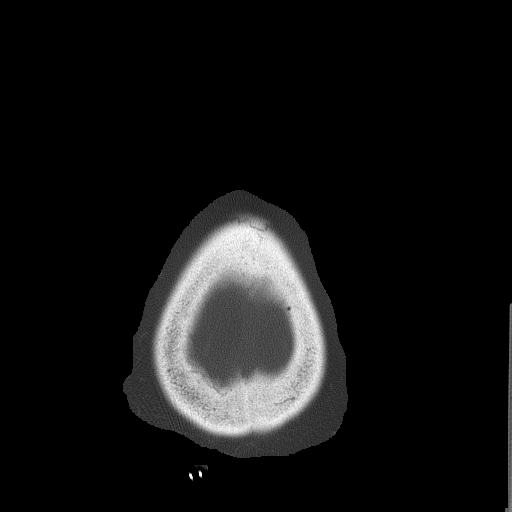

[15 of 30 positions shown; findings below may reference images not displayed]

FINDINGS: Sinuses/Soft tissues: Right globe resection with presumed
prosthesis. Clear paranasal sinuses and mastoid air cells.

Intracranial: Moderate low density in the periventricular white
matter likely related to small vessel disease. Cerebral and
cerebellar atrophy. The cerebellar atrophy is mildly age advanced.
No mass lesion, hemorrhage, hydrocephalus, acute infarct,
intra-axial, or extra-axial fluid collection.
IMPRESSION: 1.  No acute intracranial abnormality.
2. Atrophy and small vessel ischemic change.

## 2016-04-13 DIAGNOSIS — I10 Essential (primary) hypertension: Secondary | ICD-10-CM | POA: Diagnosis not present

## 2016-04-13 IMAGING — MR MR MRA HEAD W/O CM
7 of 10 series · 25 of 48 positions shown · non-contrast
Comparison: CT head December 04, 2014

CLINICAL DATA: Neurological deficits: Uncontrollable twitching
RIGHT arm beginning 5 days ago, leg weakness, stuttering speech
beginning today. History of end-stage renal disease on dialysis,
CHF, hypertension, RIGHT eye blindness, diabetes.

EXAM:
MRI HEAD WITHOUT CONTRAST
MRA HEAD WITHOUT CONTRAST
TECHNIQUE: Multiplanar, multiecho pulse sequences of the brain and surrounding
structures were obtained without intravenous contrast. Angiographic
images of the head were obtained using MRA technique without
contrast. Coronal diffusion weighted imaging not able to be
obtained.

[Series 3: DWI · axial · 3.6mm · 0.94mm/px · z∈[-153,-4]mm · 7 of 86 slices shown (1 of 2)]
[im 1/86]
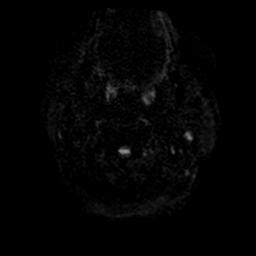
[im 15/86]
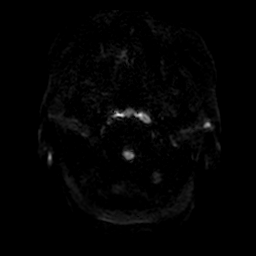
[im 29/86]
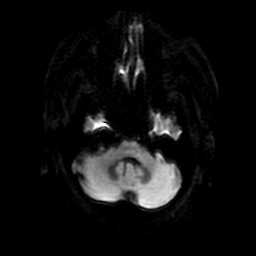
[im 43/86]
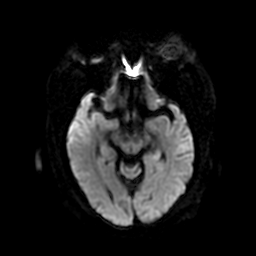
[im 57/86]
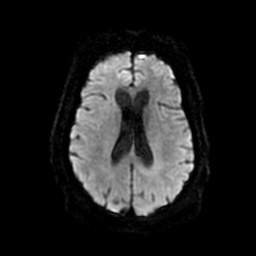
[im 71/86]
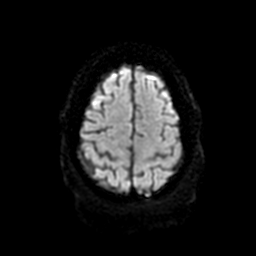
[im 86/86]
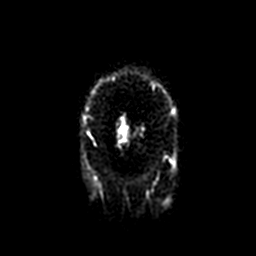

[Series 4: T2 · axial · 5.0mm · 0.47mm/px · z∈[-153,-4]mm · 2 of 26 slices shown (1 of 2)]
[im 1/26]
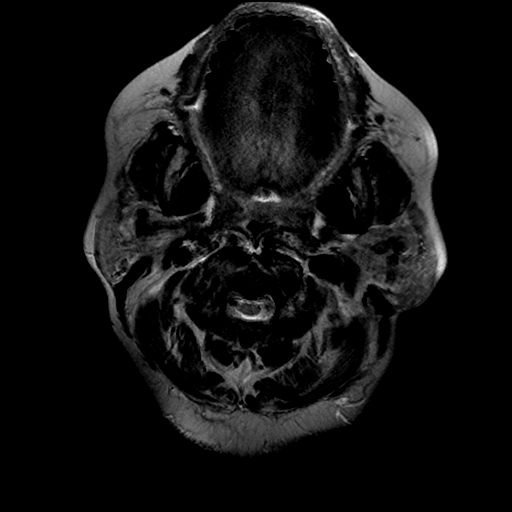
[im 26/26]
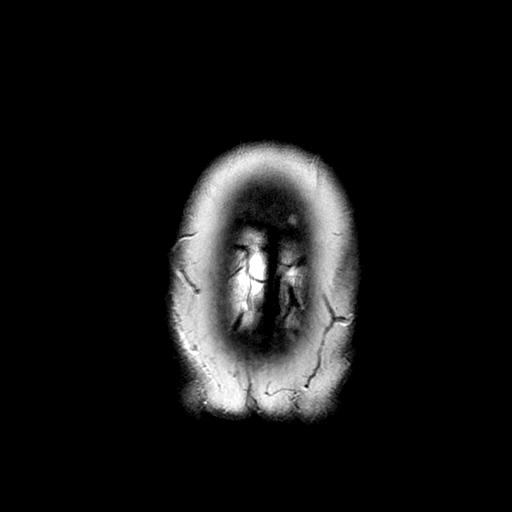

[Series 5: FLAIR · axial · 5.0mm · 0.47mm/px · z∈[-153,-4]mm · 2 of 26 slices shown (1 of 2)]
[im 1/26]
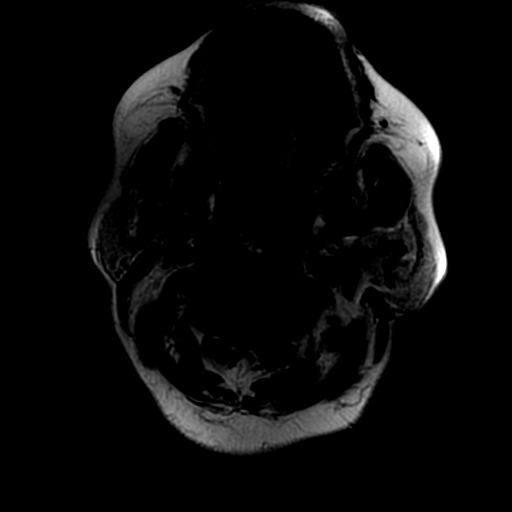
[im 26/26]
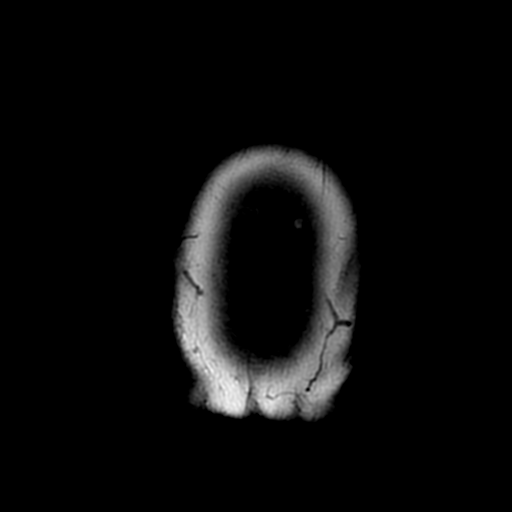

[Series 6: (person_name) · axial · 3.0mm · 0.47mm/px · z∈[-154,-24]mm · 7 of 104 slices shown]
[im 1/104]
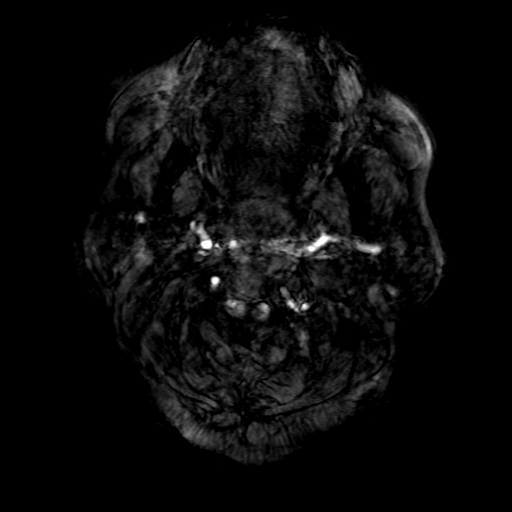
[im 15/104]
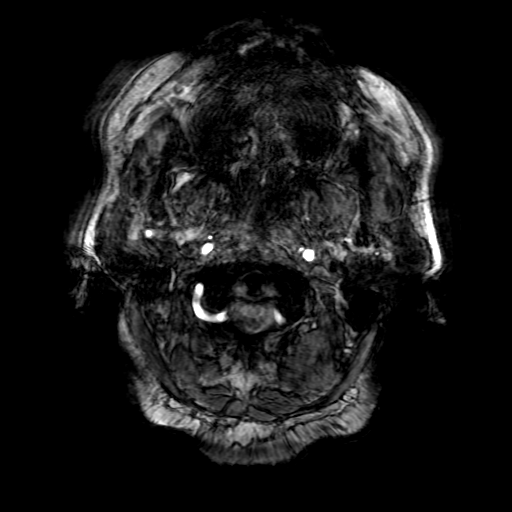
[im 30/104]
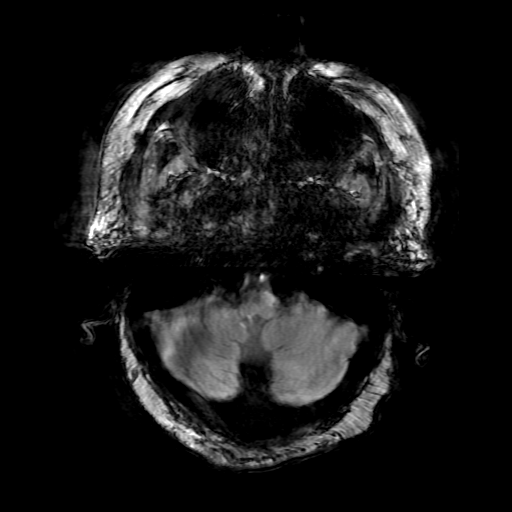
[im 45/104]
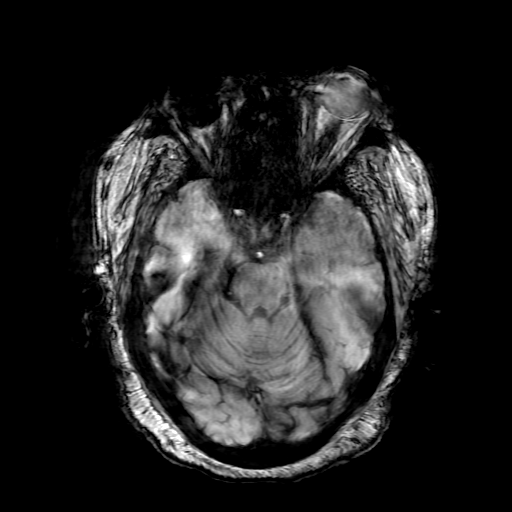
[im 59/104]
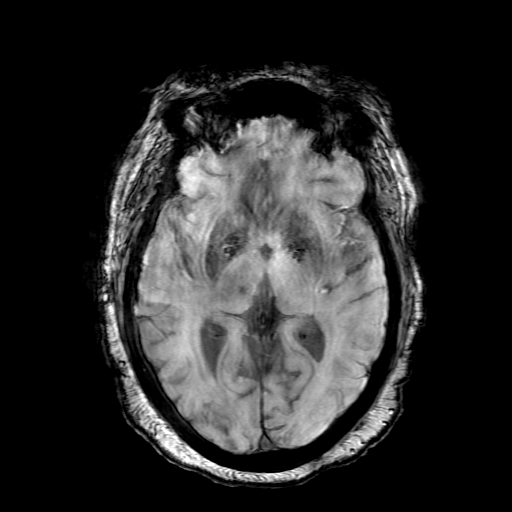
[im 74/104]
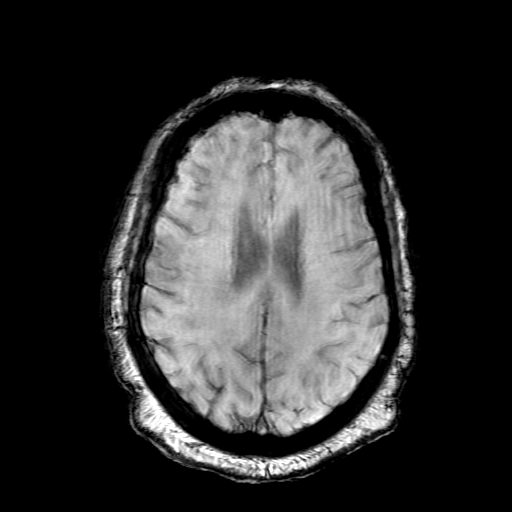
[im 89/104]
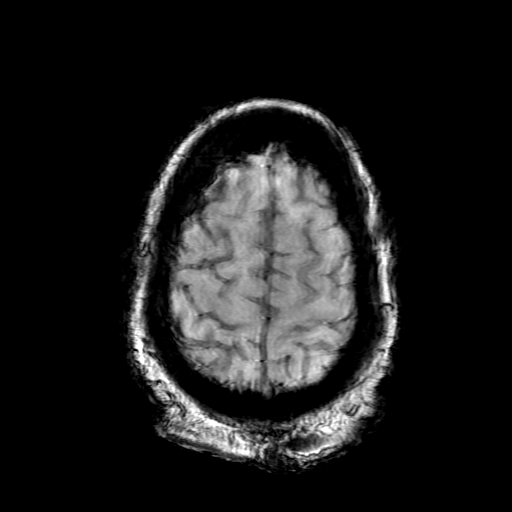

[Series 8: FLAIR · sagittal · 5.0mm · 0.47mm/px · 2 of 23 slices shown (2 of 2)]
[im 1/23]
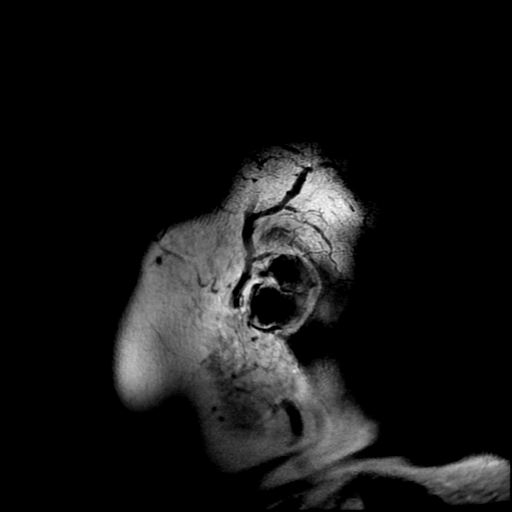
[im 23/23]
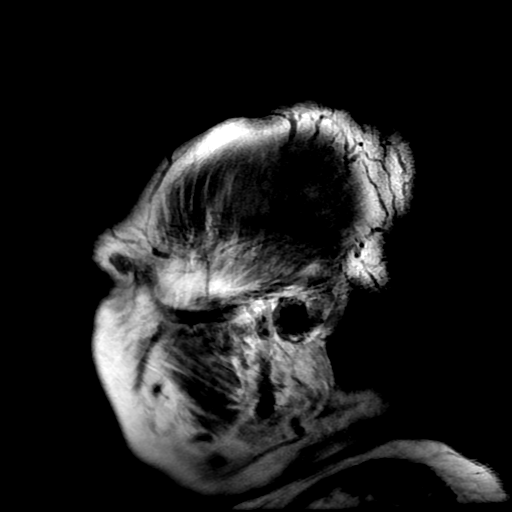

[Series 12: T2 · coronal · 5.0mm · 0.47mm/px · 2 of 29 slices shown (2 of 2)]
[im 1/29]
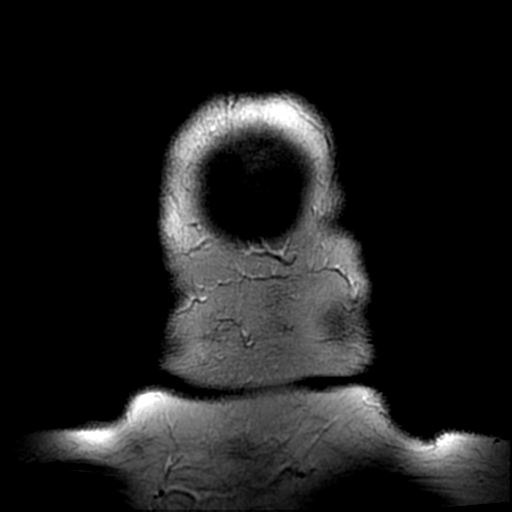
[im 29/29]
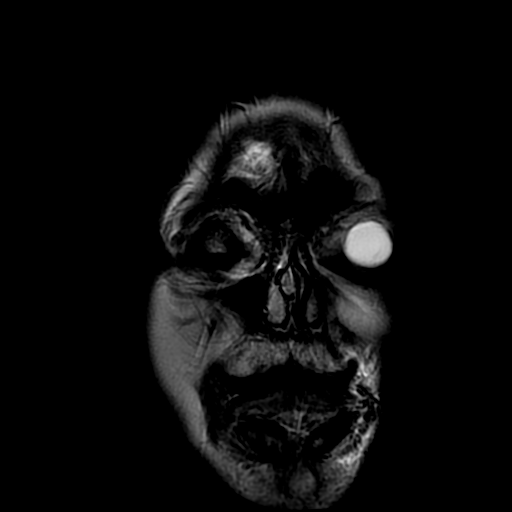

[Series 300: DWI · axial · 3.6mm · 0.94mm/px · z∈[-153,-4]mm · 3 of 42 slices shown (2 of 2)]
[im 1/42]
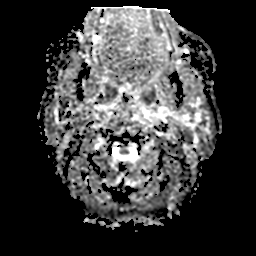
[im 21/42]
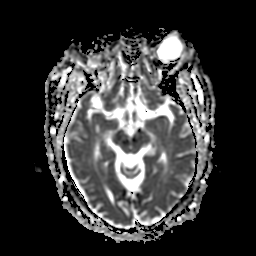
[im 42/42]
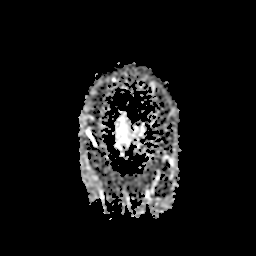

[25 of 48 positions shown; findings below may reference images not displayed]

FINDINGS: MRI HEAD FINDINGS

Mildly motion degraded examination. No reduced diffusion to suggest
acute ischemia. No susceptibility artifact to suggest hemorrhage.
Ventricles and sulci are normal for patient's age. Old small RIGHT
cerebellar infarct. Old RIGHT thalamus and bilateral basal ganglia
lacunar infarcts. Patchy to confluent supratentorial and pontine
white matter T2 hyperintensities without midline shift, mass effect
or mass lesions.

No abnormal extra-axial fluid collections. RIGHT ocular globe
prosthesis. Status post LEFT ocular lens implant. Mild paranasal
sinus mucosal thickening without air-fluid levels. Mastoid air cells
are well aerated. Mildly expanded sella move most compatible with
partially empty sella. No cerebellar tonsillar ectopia.
Heterogeneous bone marrow signal, suggesting renal osteodystrophy in
patient with history of end-stage renal disease. Patient is
edentulous.

MRA HEAD FINDINGS

Moderate to severely motion degraded examination results in
duplicated appearance of the intracranial vessels.

Flow related enhancement within the cervical, petrous, cavernous and
supraclinoid internal carotid arteries. LEFT A1 segment is dominant
with flow related enhancement within the anterior and middle
cerebral arteries. Flow related enhancement within the vertebral
arteries, basilar artery and posterior cerebral arteries. Small
apparent bilateral posterior communicating arteries present.

No acute large vessel occlusion. No high-grade stenosis, limited
assessment for moderate low grade stenosis, move limited assessment
for aneurysm due to motion.
IMPRESSION: MRI HEAD: No acute intracranial process on this mildly motion
degraded examination.

Chronic changes including old bilateral basal ganglia, RIGHT
thalamus lacunar infarcts and old small RIGHT cerebellar infarct.
Moderate to severe chronic small vessel ischemic disease.

MRA HEAD: Motion degraded examination without large vessel occlusion
or high-grade stenosis.

## 2016-04-14 DIAGNOSIS — I2510 Atherosclerotic heart disease of native coronary artery without angina pectoris: Secondary | ICD-10-CM | POA: Diagnosis not present

## 2016-04-14 DIAGNOSIS — I5032 Chronic diastolic (congestive) heart failure: Secondary | ICD-10-CM | POA: Diagnosis not present

## 2016-04-14 DIAGNOSIS — I10 Essential (primary) hypertension: Secondary | ICD-10-CM | POA: Diagnosis not present

## 2016-04-14 DIAGNOSIS — N186 End stage renal disease: Secondary | ICD-10-CM | POA: Diagnosis not present

## 2016-04-15 DIAGNOSIS — N2581 Secondary hyperparathyroidism of renal origin: Secondary | ICD-10-CM | POA: Diagnosis not present

## 2016-04-15 DIAGNOSIS — I10 Essential (primary) hypertension: Secondary | ICD-10-CM | POA: Diagnosis not present

## 2016-04-15 DIAGNOSIS — N186 End stage renal disease: Secondary | ICD-10-CM | POA: Diagnosis not present

## 2016-04-16 DIAGNOSIS — I10 Essential (primary) hypertension: Secondary | ICD-10-CM | POA: Diagnosis not present

## 2016-04-17 DIAGNOSIS — N186 End stage renal disease: Secondary | ICD-10-CM | POA: Diagnosis not present

## 2016-04-17 DIAGNOSIS — I10 Essential (primary) hypertension: Secondary | ICD-10-CM | POA: Diagnosis not present

## 2016-04-17 DIAGNOSIS — N2581 Secondary hyperparathyroidism of renal origin: Secondary | ICD-10-CM | POA: Diagnosis not present

## 2016-04-18 DIAGNOSIS — I10 Essential (primary) hypertension: Secondary | ICD-10-CM | POA: Diagnosis not present

## 2016-04-19 DIAGNOSIS — E1129 Type 2 diabetes mellitus with other diabetic kidney complication: Secondary | ICD-10-CM | POA: Diagnosis not present

## 2016-04-19 DIAGNOSIS — N186 End stage renal disease: Secondary | ICD-10-CM | POA: Diagnosis not present

## 2016-04-19 DIAGNOSIS — I10 Essential (primary) hypertension: Secondary | ICD-10-CM | POA: Diagnosis not present

## 2016-04-19 DIAGNOSIS — Z992 Dependence on renal dialysis: Secondary | ICD-10-CM | POA: Diagnosis not present

## 2016-04-19 DIAGNOSIS — N2581 Secondary hyperparathyroidism of renal origin: Secondary | ICD-10-CM | POA: Diagnosis not present

## 2016-04-20 DIAGNOSIS — I10 Essential (primary) hypertension: Secondary | ICD-10-CM | POA: Diagnosis not present

## 2016-04-21 DIAGNOSIS — I10 Essential (primary) hypertension: Secondary | ICD-10-CM | POA: Diagnosis not present

## 2016-04-22 DIAGNOSIS — N2581 Secondary hyperparathyroidism of renal origin: Secondary | ICD-10-CM | POA: Diagnosis not present

## 2016-04-22 DIAGNOSIS — N186 End stage renal disease: Secondary | ICD-10-CM | POA: Diagnosis not present

## 2016-04-22 DIAGNOSIS — I10 Essential (primary) hypertension: Secondary | ICD-10-CM | POA: Diagnosis not present

## 2016-04-23 DIAGNOSIS — I10 Essential (primary) hypertension: Secondary | ICD-10-CM | POA: Diagnosis not present

## 2016-04-24 DIAGNOSIS — N186 End stage renal disease: Secondary | ICD-10-CM | POA: Diagnosis not present

## 2016-04-24 DIAGNOSIS — I10 Essential (primary) hypertension: Secondary | ICD-10-CM | POA: Diagnosis not present

## 2016-04-24 DIAGNOSIS — N2581 Secondary hyperparathyroidism of renal origin: Secondary | ICD-10-CM | POA: Diagnosis not present

## 2016-04-25 DIAGNOSIS — I10 Essential (primary) hypertension: Secondary | ICD-10-CM | POA: Diagnosis not present

## 2016-04-26 DIAGNOSIS — N2581 Secondary hyperparathyroidism of renal origin: Secondary | ICD-10-CM | POA: Diagnosis not present

## 2016-04-26 DIAGNOSIS — I10 Essential (primary) hypertension: Secondary | ICD-10-CM | POA: Diagnosis not present

## 2016-04-26 DIAGNOSIS — N186 End stage renal disease: Secondary | ICD-10-CM | POA: Diagnosis not present

## 2016-04-27 DIAGNOSIS — I10 Essential (primary) hypertension: Secondary | ICD-10-CM | POA: Diagnosis not present

## 2016-04-29 DIAGNOSIS — N2581 Secondary hyperparathyroidism of renal origin: Secondary | ICD-10-CM | POA: Diagnosis not present

## 2016-04-29 DIAGNOSIS — N186 End stage renal disease: Secondary | ICD-10-CM | POA: Diagnosis not present

## 2016-04-30 ENCOUNTER — Other Ambulatory Visit: Payer: Self-pay

## 2016-04-30 ENCOUNTER — Ambulatory Visit: Payer: Self-pay

## 2016-04-30 ENCOUNTER — Other Ambulatory Visit: Payer: Self-pay | Admitting: Pharmacist

## 2016-04-30 NOTE — Patient Outreach (Signed)
[facility]) Care Management  04/30/2016  [name] 1945-06-22 [id number]  Co-visit with [facility] pharmacist scheduled for today.  RNCM called to confirm home visit scheduled for today. No answer. RNCM arrived at client's home. [facility] pharmacist present. [name] was not home.   Plan: follow up within the next 1-2 weeks to assess for additional care management needs.  [name], RN, MSN, [place] Coordinator Cell: [phone]

## 2016-04-30 NOTE — Patient Outreach (Signed)
[facility]) Care Management  04/30/2016  [name] [birth year] [id number]  Went to patient's home to complete a home visit. No answer at the door.  Plan:  I will follow up with the patient within the next week (via telephone).  Close pharmacy case.   [name], PharmD, [place] Clinical Pharmacist [phone]

## 2016-05-01 DIAGNOSIS — N2581 Secondary hyperparathyroidism of renal origin: Secondary | ICD-10-CM | POA: Diagnosis not present

## 2016-05-01 DIAGNOSIS — N186 End stage renal disease: Secondary | ICD-10-CM | POA: Diagnosis not present

## 2016-05-03 DIAGNOSIS — N2581 Secondary hyperparathyroidism of renal origin: Secondary | ICD-10-CM | POA: Diagnosis not present

## 2016-05-03 DIAGNOSIS — N186 End stage renal disease: Secondary | ICD-10-CM | POA: Diagnosis not present

## 2016-05-05 ENCOUNTER — Other Ambulatory Visit: Payer: Self-pay | Admitting: Pharmacist

## 2016-05-05 NOTE — Patient Outreach (Addendum)
[place]) Care Management  05/05/2016  [name] 11/28/1945 [id number]   Patient was called to follow up after missing her scheduled home visit last week. No answer and the patient's voicemail was full. Unable to leave a message.  Plan:  I will reach out to the patient within the next 7 days.  [name], PharmD, [place] Clinical Pharmacist [phone]

## 2016-05-06 DIAGNOSIS — N2581 Secondary hyperparathyroidism of renal origin: Secondary | ICD-10-CM | POA: Diagnosis not present

## 2016-05-06 DIAGNOSIS — N186 End stage renal disease: Secondary | ICD-10-CM | POA: Diagnosis not present

## 2016-05-08 DIAGNOSIS — N2581 Secondary hyperparathyroidism of renal origin: Secondary | ICD-10-CM | POA: Diagnosis not present

## 2016-05-08 DIAGNOSIS — N186 End stage renal disease: Secondary | ICD-10-CM | POA: Diagnosis not present

## 2016-05-09 ENCOUNTER — Other Ambulatory Visit: Payer: Self-pay | Admitting: Pharmacist

## 2016-05-09 NOTE — Patient Outreach (Signed)
[facility]) Care Management  05/09/2016  [name] 05/11/1945 [id number]   Called patient to follow up with her. Second attempt.  Phone was busy x 4 then there was no answer and her voicemail was full.  Plan:  I will call the patient back in one week.  If I do not reach her then, she will be sent a letter.  After 10 days, if I do not hear from her, I will close the pharmacy case.  [name], PharmD, [place] Clinical Pharmacist [phone]

## 2016-05-10 DIAGNOSIS — N2581 Secondary hyperparathyroidism of renal origin: Secondary | ICD-10-CM | POA: Diagnosis not present

## 2016-05-10 DIAGNOSIS — N186 End stage renal disease: Secondary | ICD-10-CM | POA: Diagnosis not present

## 2016-05-13 DIAGNOSIS — N2581 Secondary hyperparathyroidism of renal origin: Secondary | ICD-10-CM | POA: Diagnosis not present

## 2016-05-13 DIAGNOSIS — N186 End stage renal disease: Secondary | ICD-10-CM | POA: Diagnosis not present

## 2016-05-15 ENCOUNTER — Other Ambulatory Visit: Payer: Self-pay

## 2016-05-15 DIAGNOSIS — N186 End stage renal disease: Secondary | ICD-10-CM | POA: Diagnosis not present

## 2016-05-15 DIAGNOSIS — N2581 Secondary hyperparathyroidism of renal origin: Secondary | ICD-10-CM | POA: Diagnosis not present

## 2016-05-15 NOTE — Patient Outreach (Addendum)
[facility]) Care Management  05/15/2016  [name] 12-17-1945 [id number]   Subjective: RNCM called to follow up.client reports she is doing well. Denies any issues or concerns.  Objective: none-telephonic  Assessment: 71 year old with history of ESRD/HD, heart failure, COPD, diabetes. Hospitalized 1/10-1/23 with acute on chronic heart failure and acute bronchitis.    Client continues to attend dialysis three days/week. Client reports she is weighing at the dialysis center and weighing at home. Client reports she is recording her weights. RNCN reinforced when to call the doctor.  She reports her blood sugars have been ranging 90-100's. She denies any hypoglycemic episodes.  Goals met with Community Care Coordinator-RNCM discussed health coach as the next step. Client declines stating she feels as if both her blood sugar and heart failure is managed at this time.  Plan: RNCM will close out of case and notify primary care. [facility] Pharmacist still active at this time.  [name], RN, MSN, [place] Coordinator Cell: [phone]

## 2016-05-17 DIAGNOSIS — N2581 Secondary hyperparathyroidism of renal origin: Secondary | ICD-10-CM | POA: Diagnosis not present

## 2016-05-17 DIAGNOSIS — N186 End stage renal disease: Secondary | ICD-10-CM | POA: Diagnosis not present

## 2016-05-19 DIAGNOSIS — E1129 Type 2 diabetes mellitus with other diabetic kidney complication: Secondary | ICD-10-CM | POA: Diagnosis not present

## 2016-05-19 DIAGNOSIS — Z992 Dependence on renal dialysis: Secondary | ICD-10-CM | POA: Diagnosis not present

## 2016-05-19 DIAGNOSIS — N186 End stage renal disease: Secondary | ICD-10-CM | POA: Diagnosis not present

## 2016-05-19 NOTE — Addendum Note (Signed)
Addended by: Virgel Manifold on: 05/19/2016 04:35 PM   Modules accepted: Level of Service, SmartSet

## 2016-05-19 NOTE — Progress Notes (Signed)
This encounter was created in error - please disregard.

## 2016-05-20 DIAGNOSIS — D631 Anemia in chronic kidney disease: Secondary | ICD-10-CM | POA: Diagnosis not present

## 2016-05-20 DIAGNOSIS — N186 End stage renal disease: Secondary | ICD-10-CM | POA: Diagnosis not present

## 2016-05-20 DIAGNOSIS — E119 Type 2 diabetes mellitus without complications: Secondary | ICD-10-CM | POA: Diagnosis not present

## 2016-05-20 DIAGNOSIS — D689 Coagulation defect, unspecified: Secondary | ICD-10-CM | POA: Diagnosis not present

## 2016-05-20 DIAGNOSIS — E875 Hyperkalemia: Secondary | ICD-10-CM | POA: Diagnosis not present

## 2016-05-20 DIAGNOSIS — N2581 Secondary hyperparathyroidism of renal origin: Secondary | ICD-10-CM | POA: Diagnosis not present

## 2016-05-21 ENCOUNTER — Encounter: Payer: Self-pay | Admitting: Pharmacist

## 2016-05-21 ENCOUNTER — Other Ambulatory Visit: Payer: Self-pay | Admitting: Pharmacist

## 2016-05-21 DIAGNOSIS — M810 Age-related osteoporosis without current pathological fracture: Secondary | ICD-10-CM | POA: Diagnosis not present

## 2016-05-21 DIAGNOSIS — E784 Other hyperlipidemia: Secondary | ICD-10-CM | POA: Diagnosis not present

## 2016-05-21 DIAGNOSIS — M109 Gout, unspecified: Secondary | ICD-10-CM | POA: Diagnosis not present

## 2016-05-21 DIAGNOSIS — E1140 Type 2 diabetes mellitus with diabetic neuropathy, unspecified: Secondary | ICD-10-CM | POA: Diagnosis not present

## 2016-05-21 NOTE — Patient Outreach (Addendum)
[facility]) Care Management  05/21/2016  [name] [birth year] [id number]  Patient was called to follow up on Medication Management. HIPAA identifiers were obtained.  Patient has been very difficult to contact and she no-showed our last home visit.    Patient confirmed her medications have not changed since our visit and that she is NOT taking rosuvastatin.  (Rosuvastatin was discontinued by Dr. Omelia Blackwater during our last home visit because she was taking atorvastatin as well.)  Patient said she is managing her medications and does not need further [place] involvement.  Plan:  Pharmacy case will be closed.  Patient was provided my contact information for any future medication issues or concerns.   [name], PharmD, [place] Clinical Pharmacist [phone]

## 2016-05-22 ENCOUNTER — Encounter: Payer: Self-pay | Admitting: Pharmacist

## 2016-05-22 DIAGNOSIS — D631 Anemia in chronic kidney disease: Secondary | ICD-10-CM | POA: Diagnosis not present

## 2016-05-22 DIAGNOSIS — E119 Type 2 diabetes mellitus without complications: Secondary | ICD-10-CM | POA: Diagnosis not present

## 2016-05-22 DIAGNOSIS — N186 End stage renal disease: Secondary | ICD-10-CM | POA: Diagnosis not present

## 2016-05-22 DIAGNOSIS — E875 Hyperkalemia: Secondary | ICD-10-CM | POA: Diagnosis not present

## 2016-05-22 DIAGNOSIS — D689 Coagulation defect, unspecified: Secondary | ICD-10-CM | POA: Diagnosis not present

## 2016-05-22 DIAGNOSIS — N2581 Secondary hyperparathyroidism of renal origin: Secondary | ICD-10-CM | POA: Diagnosis not present

## 2016-05-23 ENCOUNTER — Other Ambulatory Visit: Payer: Self-pay | Admitting: General Surgery

## 2016-05-23 DIAGNOSIS — R109 Unspecified abdominal pain: Secondary | ICD-10-CM | POA: Diagnosis not present

## 2016-05-23 DIAGNOSIS — R1030 Lower abdominal pain, unspecified: Secondary | ICD-10-CM

## 2016-05-24 DIAGNOSIS — E119 Type 2 diabetes mellitus without complications: Secondary | ICD-10-CM | POA: Diagnosis not present

## 2016-05-24 DIAGNOSIS — D631 Anemia in chronic kidney disease: Secondary | ICD-10-CM | POA: Diagnosis not present

## 2016-05-24 DIAGNOSIS — N2581 Secondary hyperparathyroidism of renal origin: Secondary | ICD-10-CM | POA: Diagnosis not present

## 2016-05-24 DIAGNOSIS — D689 Coagulation defect, unspecified: Secondary | ICD-10-CM | POA: Diagnosis not present

## 2016-05-24 DIAGNOSIS — E875 Hyperkalemia: Secondary | ICD-10-CM | POA: Diagnosis not present

## 2016-05-24 DIAGNOSIS — N186 End stage renal disease: Secondary | ICD-10-CM | POA: Diagnosis not present

## 2016-05-26 ENCOUNTER — Ambulatory Visit
Admission: RE | Admit: 2016-05-26 | Discharge: 2016-05-26 | Disposition: A | Discharge status: Home / Self Care | Payer: Commercial Managed Care - HMO | Source: Ambulatory Visit | Attending: General Surgery | Admitting: General Surgery

## 2016-05-26 DIAGNOSIS — K432 Incisional hernia without obstruction or gangrene: Secondary | ICD-10-CM | POA: Diagnosis not present

## 2016-05-26 DIAGNOSIS — R1030 Lower abdominal pain, unspecified: Secondary | ICD-10-CM

## 2016-05-27 DIAGNOSIS — D689 Coagulation defect, unspecified: Secondary | ICD-10-CM | POA: Diagnosis not present

## 2016-05-27 DIAGNOSIS — D631 Anemia in chronic kidney disease: Secondary | ICD-10-CM | POA: Diagnosis not present

## 2016-05-27 DIAGNOSIS — N186 End stage renal disease: Secondary | ICD-10-CM | POA: Diagnosis not present

## 2016-05-27 DIAGNOSIS — E875 Hyperkalemia: Secondary | ICD-10-CM | POA: Diagnosis not present

## 2016-05-27 DIAGNOSIS — N2581 Secondary hyperparathyroidism of renal origin: Secondary | ICD-10-CM | POA: Diagnosis not present

## 2016-05-27 DIAGNOSIS — E119 Type 2 diabetes mellitus without complications: Secondary | ICD-10-CM | POA: Diagnosis not present

## 2016-05-28 DIAGNOSIS — Z0000 Encounter for general adult medical examination without abnormal findings: Secondary | ICD-10-CM | POA: Diagnosis not present

## 2016-05-28 DIAGNOSIS — I509 Heart failure, unspecified: Secondary | ICD-10-CM | POA: Diagnosis not present

## 2016-05-28 DIAGNOSIS — N186 End stage renal disease: Secondary | ICD-10-CM | POA: Diagnosis not present

## 2016-05-28 DIAGNOSIS — K439 Ventral hernia without obstruction or gangrene: Secondary | ICD-10-CM | POA: Diagnosis not present

## 2016-05-28 DIAGNOSIS — M109 Gout, unspecified: Secondary | ICD-10-CM | POA: Diagnosis not present

## 2016-05-28 DIAGNOSIS — E784 Other hyperlipidemia: Secondary | ICD-10-CM | POA: Diagnosis not present

## 2016-05-28 DIAGNOSIS — E1129 Type 2 diabetes mellitus with other diabetic kidney complication: Secondary | ICD-10-CM | POA: Diagnosis not present

## 2016-05-28 DIAGNOSIS — I132 Hypertensive heart and chronic kidney disease with heart failure and with stage 5 chronic kidney disease, or end stage renal disease: Secondary | ICD-10-CM | POA: Diagnosis not present

## 2016-05-28 DIAGNOSIS — M810 Age-related osteoporosis without current pathological fracture: Secondary | ICD-10-CM | POA: Diagnosis not present

## 2016-05-29 DIAGNOSIS — E119 Type 2 diabetes mellitus without complications: Secondary | ICD-10-CM | POA: Diagnosis not present

## 2016-05-29 DIAGNOSIS — D689 Coagulation defect, unspecified: Secondary | ICD-10-CM | POA: Diagnosis not present

## 2016-05-29 DIAGNOSIS — N2581 Secondary hyperparathyroidism of renal origin: Secondary | ICD-10-CM | POA: Diagnosis not present

## 2016-05-29 DIAGNOSIS — E875 Hyperkalemia: Secondary | ICD-10-CM | POA: Diagnosis not present

## 2016-05-29 DIAGNOSIS — K432 Incisional hernia without obstruction or gangrene: Secondary | ICD-10-CM | POA: Diagnosis not present

## 2016-05-29 DIAGNOSIS — N186 End stage renal disease: Secondary | ICD-10-CM | POA: Diagnosis not present

## 2016-05-29 DIAGNOSIS — D631 Anemia in chronic kidney disease: Secondary | ICD-10-CM | POA: Diagnosis not present

## 2016-05-30 DIAGNOSIS — Z1212 Encounter for screening for malignant neoplasm of rectum: Secondary | ICD-10-CM | POA: Diagnosis not present

## 2016-05-31 DIAGNOSIS — E875 Hyperkalemia: Secondary | ICD-10-CM | POA: Diagnosis not present

## 2016-05-31 DIAGNOSIS — E119 Type 2 diabetes mellitus without complications: Secondary | ICD-10-CM | POA: Diagnosis not present

## 2016-05-31 DIAGNOSIS — D689 Coagulation defect, unspecified: Secondary | ICD-10-CM | POA: Diagnosis not present

## 2016-05-31 DIAGNOSIS — N186 End stage renal disease: Secondary | ICD-10-CM | POA: Diagnosis not present

## 2016-05-31 DIAGNOSIS — D631 Anemia in chronic kidney disease: Secondary | ICD-10-CM | POA: Diagnosis not present

## 2016-05-31 DIAGNOSIS — E782 Mixed hyperlipidemia: Secondary | ICD-10-CM | POA: Diagnosis not present

## 2016-05-31 DIAGNOSIS — N2581 Secondary hyperparathyroidism of renal origin: Secondary | ICD-10-CM | POA: Diagnosis not present

## 2016-06-01 DIAGNOSIS — E782 Mixed hyperlipidemia: Secondary | ICD-10-CM | POA: Diagnosis not present

## 2016-06-02 DIAGNOSIS — E782 Mixed hyperlipidemia: Secondary | ICD-10-CM | POA: Diagnosis not present

## 2016-06-03 DIAGNOSIS — D631 Anemia in chronic kidney disease: Secondary | ICD-10-CM | POA: Diagnosis not present

## 2016-06-03 DIAGNOSIS — D689 Coagulation defect, unspecified: Secondary | ICD-10-CM | POA: Diagnosis not present

## 2016-06-03 DIAGNOSIS — N186 End stage renal disease: Secondary | ICD-10-CM | POA: Diagnosis not present

## 2016-06-03 DIAGNOSIS — N2581 Secondary hyperparathyroidism of renal origin: Secondary | ICD-10-CM | POA: Diagnosis not present

## 2016-06-03 DIAGNOSIS — E875 Hyperkalemia: Secondary | ICD-10-CM | POA: Diagnosis not present

## 2016-06-03 DIAGNOSIS — E782 Mixed hyperlipidemia: Secondary | ICD-10-CM | POA: Diagnosis not present

## 2016-06-03 DIAGNOSIS — E119 Type 2 diabetes mellitus without complications: Secondary | ICD-10-CM | POA: Diagnosis not present

## 2016-06-04 DIAGNOSIS — E782 Mixed hyperlipidemia: Secondary | ICD-10-CM | POA: Diagnosis not present

## 2016-06-05 DIAGNOSIS — E875 Hyperkalemia: Secondary | ICD-10-CM | POA: Diagnosis not present

## 2016-06-05 DIAGNOSIS — E782 Mixed hyperlipidemia: Secondary | ICD-10-CM | POA: Diagnosis not present

## 2016-06-05 DIAGNOSIS — N2581 Secondary hyperparathyroidism of renal origin: Secondary | ICD-10-CM | POA: Diagnosis not present

## 2016-06-05 DIAGNOSIS — D631 Anemia in chronic kidney disease: Secondary | ICD-10-CM | POA: Diagnosis not present

## 2016-06-05 DIAGNOSIS — E119 Type 2 diabetes mellitus without complications: Secondary | ICD-10-CM | POA: Diagnosis not present

## 2016-06-05 DIAGNOSIS — D689 Coagulation defect, unspecified: Secondary | ICD-10-CM | POA: Diagnosis not present

## 2016-06-05 DIAGNOSIS — N186 End stage renal disease: Secondary | ICD-10-CM | POA: Diagnosis not present

## 2016-06-06 DIAGNOSIS — E782 Mixed hyperlipidemia: Secondary | ICD-10-CM | POA: Diagnosis not present

## 2016-06-07 DIAGNOSIS — D631 Anemia in chronic kidney disease: Secondary | ICD-10-CM | POA: Diagnosis not present

## 2016-06-07 DIAGNOSIS — E875 Hyperkalemia: Secondary | ICD-10-CM | POA: Diagnosis not present

## 2016-06-07 DIAGNOSIS — E782 Mixed hyperlipidemia: Secondary | ICD-10-CM | POA: Diagnosis not present

## 2016-06-07 DIAGNOSIS — E119 Type 2 diabetes mellitus without complications: Secondary | ICD-10-CM | POA: Diagnosis not present

## 2016-06-07 DIAGNOSIS — N186 End stage renal disease: Secondary | ICD-10-CM | POA: Diagnosis not present

## 2016-06-07 DIAGNOSIS — D689 Coagulation defect, unspecified: Secondary | ICD-10-CM | POA: Diagnosis not present

## 2016-06-07 DIAGNOSIS — N2581 Secondary hyperparathyroidism of renal origin: Secondary | ICD-10-CM | POA: Diagnosis not present

## 2016-06-08 DIAGNOSIS — E782 Mixed hyperlipidemia: Secondary | ICD-10-CM | POA: Diagnosis not present

## 2016-06-09 DIAGNOSIS — E782 Mixed hyperlipidemia: Secondary | ICD-10-CM | POA: Diagnosis not present

## 2016-06-09 DIAGNOSIS — H40022 Open angle with borderline findings, high risk, left eye: Secondary | ICD-10-CM | POA: Diagnosis not present

## 2016-06-10 DIAGNOSIS — D689 Coagulation defect, unspecified: Secondary | ICD-10-CM | POA: Diagnosis not present

## 2016-06-10 DIAGNOSIS — N2581 Secondary hyperparathyroidism of renal origin: Secondary | ICD-10-CM | POA: Diagnosis not present

## 2016-06-10 DIAGNOSIS — E119 Type 2 diabetes mellitus without complications: Secondary | ICD-10-CM | POA: Diagnosis not present

## 2016-06-10 DIAGNOSIS — E782 Mixed hyperlipidemia: Secondary | ICD-10-CM | POA: Diagnosis not present

## 2016-06-10 DIAGNOSIS — N186 End stage renal disease: Secondary | ICD-10-CM | POA: Diagnosis not present

## 2016-06-10 DIAGNOSIS — E875 Hyperkalemia: Secondary | ICD-10-CM | POA: Diagnosis not present

## 2016-06-10 DIAGNOSIS — D631 Anemia in chronic kidney disease: Secondary | ICD-10-CM | POA: Diagnosis not present

## 2016-06-11 DIAGNOSIS — E782 Mixed hyperlipidemia: Secondary | ICD-10-CM | POA: Diagnosis not present

## 2016-06-12 DIAGNOSIS — D631 Anemia in chronic kidney disease: Secondary | ICD-10-CM | POA: Diagnosis not present

## 2016-06-12 DIAGNOSIS — E119 Type 2 diabetes mellitus without complications: Secondary | ICD-10-CM | POA: Diagnosis not present

## 2016-06-12 DIAGNOSIS — E875 Hyperkalemia: Secondary | ICD-10-CM | POA: Diagnosis not present

## 2016-06-12 DIAGNOSIS — N2581 Secondary hyperparathyroidism of renal origin: Secondary | ICD-10-CM | POA: Diagnosis not present

## 2016-06-12 DIAGNOSIS — N186 End stage renal disease: Secondary | ICD-10-CM | POA: Diagnosis not present

## 2016-06-12 DIAGNOSIS — D689 Coagulation defect, unspecified: Secondary | ICD-10-CM | POA: Diagnosis not present

## 2016-06-12 DIAGNOSIS — E782 Mixed hyperlipidemia: Secondary | ICD-10-CM | POA: Diagnosis not present

## 2016-06-13 DIAGNOSIS — E782 Mixed hyperlipidemia: Secondary | ICD-10-CM | POA: Diagnosis not present

## 2016-06-14 DIAGNOSIS — E782 Mixed hyperlipidemia: Secondary | ICD-10-CM | POA: Diagnosis not present

## 2016-06-14 DIAGNOSIS — D689 Coagulation defect, unspecified: Secondary | ICD-10-CM | POA: Diagnosis not present

## 2016-06-14 DIAGNOSIS — N2581 Secondary hyperparathyroidism of renal origin: Secondary | ICD-10-CM | POA: Diagnosis not present

## 2016-06-14 DIAGNOSIS — E119 Type 2 diabetes mellitus without complications: Secondary | ICD-10-CM | POA: Diagnosis not present

## 2016-06-14 DIAGNOSIS — D631 Anemia in chronic kidney disease: Secondary | ICD-10-CM | POA: Diagnosis not present

## 2016-06-14 DIAGNOSIS — N186 End stage renal disease: Secondary | ICD-10-CM | POA: Diagnosis not present

## 2016-06-14 DIAGNOSIS — E875 Hyperkalemia: Secondary | ICD-10-CM | POA: Diagnosis not present

## 2016-06-15 DIAGNOSIS — E782 Mixed hyperlipidemia: Secondary | ICD-10-CM | POA: Diagnosis not present

## 2016-06-16 DIAGNOSIS — E782 Mixed hyperlipidemia: Secondary | ICD-10-CM | POA: Diagnosis not present

## 2016-06-17 DIAGNOSIS — E782 Mixed hyperlipidemia: Secondary | ICD-10-CM | POA: Diagnosis not present

## 2016-06-17 DIAGNOSIS — E119 Type 2 diabetes mellitus without complications: Secondary | ICD-10-CM | POA: Diagnosis not present

## 2016-06-17 DIAGNOSIS — D631 Anemia in chronic kidney disease: Secondary | ICD-10-CM | POA: Diagnosis not present

## 2016-06-17 DIAGNOSIS — D689 Coagulation defect, unspecified: Secondary | ICD-10-CM | POA: Diagnosis not present

## 2016-06-17 DIAGNOSIS — E875 Hyperkalemia: Secondary | ICD-10-CM | POA: Diagnosis not present

## 2016-06-17 DIAGNOSIS — N186 End stage renal disease: Secondary | ICD-10-CM | POA: Diagnosis not present

## 2016-06-17 DIAGNOSIS — N2581 Secondary hyperparathyroidism of renal origin: Secondary | ICD-10-CM | POA: Diagnosis not present

## 2016-06-18 DIAGNOSIS — E782 Mixed hyperlipidemia: Secondary | ICD-10-CM | POA: Diagnosis not present

## 2016-06-19 DIAGNOSIS — N2581 Secondary hyperparathyroidism of renal origin: Secondary | ICD-10-CM | POA: Diagnosis not present

## 2016-06-19 DIAGNOSIS — D689 Coagulation defect, unspecified: Secondary | ICD-10-CM | POA: Diagnosis not present

## 2016-06-19 DIAGNOSIS — Z992 Dependence on renal dialysis: Secondary | ICD-10-CM | POA: Diagnosis not present

## 2016-06-19 DIAGNOSIS — E1129 Type 2 diabetes mellitus with other diabetic kidney complication: Secondary | ICD-10-CM | POA: Diagnosis not present

## 2016-06-19 DIAGNOSIS — E782 Mixed hyperlipidemia: Secondary | ICD-10-CM | POA: Diagnosis not present

## 2016-06-19 DIAGNOSIS — E875 Hyperkalemia: Secondary | ICD-10-CM | POA: Diagnosis not present

## 2016-06-19 DIAGNOSIS — E119 Type 2 diabetes mellitus without complications: Secondary | ICD-10-CM | POA: Diagnosis not present

## 2016-06-19 DIAGNOSIS — N186 End stage renal disease: Secondary | ICD-10-CM | POA: Diagnosis not present

## 2016-06-19 DIAGNOSIS — D631 Anemia in chronic kidney disease: Secondary | ICD-10-CM | POA: Diagnosis not present

## 2016-06-21 DIAGNOSIS — D689 Coagulation defect, unspecified: Secondary | ICD-10-CM | POA: Diagnosis not present

## 2016-06-21 DIAGNOSIS — E119 Type 2 diabetes mellitus without complications: Secondary | ICD-10-CM | POA: Diagnosis not present

## 2016-06-21 DIAGNOSIS — E875 Hyperkalemia: Secondary | ICD-10-CM | POA: Diagnosis not present

## 2016-06-21 DIAGNOSIS — N2581 Secondary hyperparathyroidism of renal origin: Secondary | ICD-10-CM | POA: Diagnosis not present

## 2016-06-21 DIAGNOSIS — N186 End stage renal disease: Secondary | ICD-10-CM | POA: Diagnosis not present

## 2016-06-21 DIAGNOSIS — D631 Anemia in chronic kidney disease: Secondary | ICD-10-CM | POA: Diagnosis not present

## 2016-06-24 DIAGNOSIS — D631 Anemia in chronic kidney disease: Secondary | ICD-10-CM | POA: Diagnosis not present

## 2016-06-24 DIAGNOSIS — D689 Coagulation defect, unspecified: Secondary | ICD-10-CM | POA: Diagnosis not present

## 2016-06-24 DIAGNOSIS — E119 Type 2 diabetes mellitus without complications: Secondary | ICD-10-CM | POA: Diagnosis not present

## 2016-06-24 DIAGNOSIS — E875 Hyperkalemia: Secondary | ICD-10-CM | POA: Diagnosis not present

## 2016-06-24 DIAGNOSIS — N2581 Secondary hyperparathyroidism of renal origin: Secondary | ICD-10-CM | POA: Diagnosis not present

## 2016-06-24 DIAGNOSIS — N186 End stage renal disease: Secondary | ICD-10-CM | POA: Diagnosis not present

## 2016-06-26 DIAGNOSIS — E875 Hyperkalemia: Secondary | ICD-10-CM | POA: Diagnosis not present

## 2016-06-26 DIAGNOSIS — N2581 Secondary hyperparathyroidism of renal origin: Secondary | ICD-10-CM | POA: Diagnosis not present

## 2016-06-26 DIAGNOSIS — D631 Anemia in chronic kidney disease: Secondary | ICD-10-CM | POA: Diagnosis not present

## 2016-06-26 DIAGNOSIS — E119 Type 2 diabetes mellitus without complications: Secondary | ICD-10-CM | POA: Diagnosis not present

## 2016-06-26 DIAGNOSIS — D689 Coagulation defect, unspecified: Secondary | ICD-10-CM | POA: Diagnosis not present

## 2016-06-26 DIAGNOSIS — N186 End stage renal disease: Secondary | ICD-10-CM | POA: Diagnosis not present

## 2016-06-28 DIAGNOSIS — D689 Coagulation defect, unspecified: Secondary | ICD-10-CM | POA: Diagnosis not present

## 2016-06-28 DIAGNOSIS — D631 Anemia in chronic kidney disease: Secondary | ICD-10-CM | POA: Diagnosis not present

## 2016-06-28 DIAGNOSIS — N186 End stage renal disease: Secondary | ICD-10-CM | POA: Diagnosis not present

## 2016-06-28 DIAGNOSIS — N2581 Secondary hyperparathyroidism of renal origin: Secondary | ICD-10-CM | POA: Diagnosis not present

## 2016-06-28 DIAGNOSIS — E875 Hyperkalemia: Secondary | ICD-10-CM | POA: Diagnosis not present

## 2016-06-28 DIAGNOSIS — E119 Type 2 diabetes mellitus without complications: Secondary | ICD-10-CM | POA: Diagnosis not present

## 2016-07-01 DIAGNOSIS — N2581 Secondary hyperparathyroidism of renal origin: Secondary | ICD-10-CM | POA: Diagnosis not present

## 2016-07-01 DIAGNOSIS — E875 Hyperkalemia: Secondary | ICD-10-CM | POA: Diagnosis not present

## 2016-07-01 DIAGNOSIS — D689 Coagulation defect, unspecified: Secondary | ICD-10-CM | POA: Diagnosis not present

## 2016-07-01 DIAGNOSIS — E119 Type 2 diabetes mellitus without complications: Secondary | ICD-10-CM | POA: Diagnosis not present

## 2016-07-01 DIAGNOSIS — D631 Anemia in chronic kidney disease: Secondary | ICD-10-CM | POA: Diagnosis not present

## 2016-07-01 DIAGNOSIS — N186 End stage renal disease: Secondary | ICD-10-CM | POA: Diagnosis not present

## 2016-07-03 DIAGNOSIS — E875 Hyperkalemia: Secondary | ICD-10-CM | POA: Diagnosis not present

## 2016-07-03 DIAGNOSIS — D631 Anemia in chronic kidney disease: Secondary | ICD-10-CM | POA: Diagnosis not present

## 2016-07-03 DIAGNOSIS — E119 Type 2 diabetes mellitus without complications: Secondary | ICD-10-CM | POA: Diagnosis not present

## 2016-07-03 DIAGNOSIS — D689 Coagulation defect, unspecified: Secondary | ICD-10-CM | POA: Diagnosis not present

## 2016-07-03 DIAGNOSIS — N2581 Secondary hyperparathyroidism of renal origin: Secondary | ICD-10-CM | POA: Diagnosis not present

## 2016-07-03 DIAGNOSIS — N186 End stage renal disease: Secondary | ICD-10-CM | POA: Diagnosis not present

## 2016-07-04 DIAGNOSIS — E782 Mixed hyperlipidemia: Secondary | ICD-10-CM | POA: Diagnosis not present

## 2016-07-05 DIAGNOSIS — E119 Type 2 diabetes mellitus without complications: Secondary | ICD-10-CM | POA: Diagnosis not present

## 2016-07-05 DIAGNOSIS — N2581 Secondary hyperparathyroidism of renal origin: Secondary | ICD-10-CM | POA: Diagnosis not present

## 2016-07-05 DIAGNOSIS — E782 Mixed hyperlipidemia: Secondary | ICD-10-CM | POA: Diagnosis not present

## 2016-07-05 DIAGNOSIS — D689 Coagulation defect, unspecified: Secondary | ICD-10-CM | POA: Diagnosis not present

## 2016-07-05 DIAGNOSIS — N186 End stage renal disease: Secondary | ICD-10-CM | POA: Diagnosis not present

## 2016-07-05 DIAGNOSIS — E875 Hyperkalemia: Secondary | ICD-10-CM | POA: Diagnosis not present

## 2016-07-05 DIAGNOSIS — D631 Anemia in chronic kidney disease: Secondary | ICD-10-CM | POA: Diagnosis not present

## 2016-07-06 DIAGNOSIS — E782 Mixed hyperlipidemia: Secondary | ICD-10-CM | POA: Diagnosis not present

## 2016-07-07 DIAGNOSIS — E782 Mixed hyperlipidemia: Secondary | ICD-10-CM | POA: Diagnosis not present

## 2016-07-08 DIAGNOSIS — D631 Anemia in chronic kidney disease: Secondary | ICD-10-CM | POA: Diagnosis not present

## 2016-07-08 DIAGNOSIS — E875 Hyperkalemia: Secondary | ICD-10-CM | POA: Diagnosis not present

## 2016-07-08 DIAGNOSIS — N2581 Secondary hyperparathyroidism of renal origin: Secondary | ICD-10-CM | POA: Diagnosis not present

## 2016-07-08 DIAGNOSIS — N186 End stage renal disease: Secondary | ICD-10-CM | POA: Diagnosis not present

## 2016-07-08 DIAGNOSIS — E782 Mixed hyperlipidemia: Secondary | ICD-10-CM | POA: Diagnosis not present

## 2016-07-08 DIAGNOSIS — E119 Type 2 diabetes mellitus without complications: Secondary | ICD-10-CM | POA: Diagnosis not present

## 2016-07-08 DIAGNOSIS — D689 Coagulation defect, unspecified: Secondary | ICD-10-CM | POA: Diagnosis not present

## 2016-07-09 DIAGNOSIS — E782 Mixed hyperlipidemia: Secondary | ICD-10-CM | POA: Diagnosis not present

## 2016-07-10 DIAGNOSIS — E875 Hyperkalemia: Secondary | ICD-10-CM | POA: Diagnosis not present

## 2016-07-10 DIAGNOSIS — N186 End stage renal disease: Secondary | ICD-10-CM | POA: Diagnosis not present

## 2016-07-10 DIAGNOSIS — N2581 Secondary hyperparathyroidism of renal origin: Secondary | ICD-10-CM | POA: Diagnosis not present

## 2016-07-10 DIAGNOSIS — D631 Anemia in chronic kidney disease: Secondary | ICD-10-CM | POA: Diagnosis not present

## 2016-07-10 DIAGNOSIS — D689 Coagulation defect, unspecified: Secondary | ICD-10-CM | POA: Diagnosis not present

## 2016-07-10 DIAGNOSIS — E782 Mixed hyperlipidemia: Secondary | ICD-10-CM | POA: Diagnosis not present

## 2016-07-10 DIAGNOSIS — E119 Type 2 diabetes mellitus without complications: Secondary | ICD-10-CM | POA: Diagnosis not present

## 2016-07-11 DIAGNOSIS — E782 Mixed hyperlipidemia: Secondary | ICD-10-CM | POA: Diagnosis not present

## 2016-07-12 DIAGNOSIS — D631 Anemia in chronic kidney disease: Secondary | ICD-10-CM | POA: Diagnosis not present

## 2016-07-12 DIAGNOSIS — N186 End stage renal disease: Secondary | ICD-10-CM | POA: Diagnosis not present

## 2016-07-12 DIAGNOSIS — E782 Mixed hyperlipidemia: Secondary | ICD-10-CM | POA: Diagnosis not present

## 2016-07-12 DIAGNOSIS — E119 Type 2 diabetes mellitus without complications: Secondary | ICD-10-CM | POA: Diagnosis not present

## 2016-07-12 DIAGNOSIS — E875 Hyperkalemia: Secondary | ICD-10-CM | POA: Diagnosis not present

## 2016-07-12 DIAGNOSIS — D689 Coagulation defect, unspecified: Secondary | ICD-10-CM | POA: Diagnosis not present

## 2016-07-12 DIAGNOSIS — N2581 Secondary hyperparathyroidism of renal origin: Secondary | ICD-10-CM | POA: Diagnosis not present

## 2016-07-13 DIAGNOSIS — E782 Mixed hyperlipidemia: Secondary | ICD-10-CM | POA: Diagnosis not present

## 2016-07-14 DIAGNOSIS — E782 Mixed hyperlipidemia: Secondary | ICD-10-CM | POA: Diagnosis not present

## 2016-07-15 DIAGNOSIS — E782 Mixed hyperlipidemia: Secondary | ICD-10-CM | POA: Diagnosis not present

## 2016-07-15 DIAGNOSIS — D689 Coagulation defect, unspecified: Secondary | ICD-10-CM | POA: Diagnosis not present

## 2016-07-15 DIAGNOSIS — E875 Hyperkalemia: Secondary | ICD-10-CM | POA: Diagnosis not present

## 2016-07-15 DIAGNOSIS — D631 Anemia in chronic kidney disease: Secondary | ICD-10-CM | POA: Diagnosis not present

## 2016-07-15 DIAGNOSIS — N2581 Secondary hyperparathyroidism of renal origin: Secondary | ICD-10-CM | POA: Diagnosis not present

## 2016-07-15 DIAGNOSIS — E119 Type 2 diabetes mellitus without complications: Secondary | ICD-10-CM | POA: Diagnosis not present

## 2016-07-15 DIAGNOSIS — N186 End stage renal disease: Secondary | ICD-10-CM | POA: Diagnosis not present

## 2016-07-16 DIAGNOSIS — E782 Mixed hyperlipidemia: Secondary | ICD-10-CM | POA: Diagnosis not present

## 2016-07-17 ENCOUNTER — Other Ambulatory Visit: Payer: Self-pay | Admitting: Internal Medicine

## 2016-07-17 DIAGNOSIS — E119 Type 2 diabetes mellitus without complications: Secondary | ICD-10-CM | POA: Diagnosis not present

## 2016-07-17 DIAGNOSIS — D689 Coagulation defect, unspecified: Secondary | ICD-10-CM | POA: Diagnosis not present

## 2016-07-17 DIAGNOSIS — E782 Mixed hyperlipidemia: Secondary | ICD-10-CM | POA: Diagnosis not present

## 2016-07-17 DIAGNOSIS — R921 Mammographic calcification found on diagnostic imaging of breast: Secondary | ICD-10-CM

## 2016-07-17 DIAGNOSIS — E875 Hyperkalemia: Secondary | ICD-10-CM | POA: Diagnosis not present

## 2016-07-17 DIAGNOSIS — N2581 Secondary hyperparathyroidism of renal origin: Secondary | ICD-10-CM | POA: Diagnosis not present

## 2016-07-17 DIAGNOSIS — D631 Anemia in chronic kidney disease: Secondary | ICD-10-CM | POA: Diagnosis not present

## 2016-07-17 DIAGNOSIS — N186 End stage renal disease: Secondary | ICD-10-CM | POA: Diagnosis not present

## 2016-07-18 DIAGNOSIS — E782 Mixed hyperlipidemia: Secondary | ICD-10-CM | POA: Diagnosis not present

## 2016-07-19 DIAGNOSIS — E1129 Type 2 diabetes mellitus with other diabetic kidney complication: Secondary | ICD-10-CM | POA: Diagnosis not present

## 2016-07-19 DIAGNOSIS — E782 Mixed hyperlipidemia: Secondary | ICD-10-CM | POA: Diagnosis not present

## 2016-07-19 DIAGNOSIS — Z992 Dependence on renal dialysis: Secondary | ICD-10-CM | POA: Diagnosis not present

## 2016-07-19 DIAGNOSIS — N186 End stage renal disease: Secondary | ICD-10-CM | POA: Diagnosis not present

## 2016-07-19 DIAGNOSIS — D631 Anemia in chronic kidney disease: Secondary | ICD-10-CM | POA: Diagnosis not present

## 2016-07-19 DIAGNOSIS — E875 Hyperkalemia: Secondary | ICD-10-CM | POA: Diagnosis not present

## 2016-07-19 DIAGNOSIS — D689 Coagulation defect, unspecified: Secondary | ICD-10-CM | POA: Diagnosis not present

## 2016-07-19 DIAGNOSIS — N2581 Secondary hyperparathyroidism of renal origin: Secondary | ICD-10-CM | POA: Diagnosis not present

## 2016-07-19 DIAGNOSIS — E119 Type 2 diabetes mellitus without complications: Secondary | ICD-10-CM | POA: Diagnosis not present

## 2016-07-20 DIAGNOSIS — E782 Mixed hyperlipidemia: Secondary | ICD-10-CM | POA: Diagnosis not present

## 2016-07-21 DIAGNOSIS — E782 Mixed hyperlipidemia: Secondary | ICD-10-CM | POA: Diagnosis not present

## 2016-07-22 DIAGNOSIS — D631 Anemia in chronic kidney disease: Secondary | ICD-10-CM | POA: Diagnosis not present

## 2016-07-22 DIAGNOSIS — E782 Mixed hyperlipidemia: Secondary | ICD-10-CM | POA: Diagnosis not present

## 2016-07-22 DIAGNOSIS — D689 Coagulation defect, unspecified: Secondary | ICD-10-CM | POA: Diagnosis not present

## 2016-07-22 DIAGNOSIS — E1129 Type 2 diabetes mellitus with other diabetic kidney complication: Secondary | ICD-10-CM | POA: Diagnosis not present

## 2016-07-22 DIAGNOSIS — N2581 Secondary hyperparathyroidism of renal origin: Secondary | ICD-10-CM | POA: Diagnosis not present

## 2016-07-22 DIAGNOSIS — N186 End stage renal disease: Secondary | ICD-10-CM | POA: Diagnosis not present

## 2016-07-22 DIAGNOSIS — E119 Type 2 diabetes mellitus without complications: Secondary | ICD-10-CM | POA: Diagnosis not present

## 2016-07-22 DIAGNOSIS — E875 Hyperkalemia: Secondary | ICD-10-CM | POA: Diagnosis not present

## 2016-07-24 DIAGNOSIS — E782 Mixed hyperlipidemia: Secondary | ICD-10-CM | POA: Diagnosis not present

## 2016-07-24 DIAGNOSIS — N2581 Secondary hyperparathyroidism of renal origin: Secondary | ICD-10-CM | POA: Diagnosis not present

## 2016-07-24 DIAGNOSIS — D631 Anemia in chronic kidney disease: Secondary | ICD-10-CM | POA: Diagnosis not present

## 2016-07-24 DIAGNOSIS — E875 Hyperkalemia: Secondary | ICD-10-CM | POA: Diagnosis not present

## 2016-07-24 DIAGNOSIS — E119 Type 2 diabetes mellitus without complications: Secondary | ICD-10-CM | POA: Diagnosis not present

## 2016-07-24 DIAGNOSIS — N186 End stage renal disease: Secondary | ICD-10-CM | POA: Diagnosis not present

## 2016-07-24 DIAGNOSIS — D689 Coagulation defect, unspecified: Secondary | ICD-10-CM | POA: Diagnosis not present

## 2016-07-24 DIAGNOSIS — E1129 Type 2 diabetes mellitus with other diabetic kidney complication: Secondary | ICD-10-CM | POA: Diagnosis not present

## 2016-07-25 DIAGNOSIS — D631 Anemia in chronic kidney disease: Secondary | ICD-10-CM | POA: Diagnosis not present

## 2016-07-25 DIAGNOSIS — E119 Type 2 diabetes mellitus without complications: Secondary | ICD-10-CM | POA: Diagnosis not present

## 2016-07-25 DIAGNOSIS — E1129 Type 2 diabetes mellitus with other diabetic kidney complication: Secondary | ICD-10-CM | POA: Diagnosis not present

## 2016-07-25 DIAGNOSIS — E782 Mixed hyperlipidemia: Secondary | ICD-10-CM | POA: Diagnosis not present

## 2016-07-25 DIAGNOSIS — D689 Coagulation defect, unspecified: Secondary | ICD-10-CM | POA: Diagnosis not present

## 2016-07-25 DIAGNOSIS — E875 Hyperkalemia: Secondary | ICD-10-CM | POA: Diagnosis not present

## 2016-07-25 DIAGNOSIS — N186 End stage renal disease: Secondary | ICD-10-CM | POA: Diagnosis not present

## 2016-07-25 DIAGNOSIS — N2581 Secondary hyperparathyroidism of renal origin: Secondary | ICD-10-CM | POA: Diagnosis not present

## 2016-07-26 DIAGNOSIS — E782 Mixed hyperlipidemia: Secondary | ICD-10-CM | POA: Diagnosis not present

## 2016-07-27 DIAGNOSIS — E782 Mixed hyperlipidemia: Secondary | ICD-10-CM | POA: Diagnosis not present

## 2016-07-28 ENCOUNTER — Other Ambulatory Visit: Payer: Self-pay | Admitting: Internal Medicine

## 2016-07-28 ENCOUNTER — Ambulatory Visit
Admission: RE | Admit: 2016-07-28 | Discharge: 2016-07-28 | Disposition: A | Discharge status: Home / Self Care | Payer: Medicare HMO | Source: Ambulatory Visit | Attending: Internal Medicine | Admitting: Internal Medicine

## 2016-07-28 DIAGNOSIS — R921 Mammographic calcification found on diagnostic imaging of breast: Secondary | ICD-10-CM

## 2016-07-28 DIAGNOSIS — N6489 Other specified disorders of breast: Secondary | ICD-10-CM

## 2016-07-28 DIAGNOSIS — R928 Other abnormal and inconclusive findings on diagnostic imaging of breast: Secondary | ICD-10-CM | POA: Diagnosis not present

## 2016-07-28 DIAGNOSIS — N63 Unspecified lump in unspecified breast: Secondary | ICD-10-CM

## 2016-07-28 DIAGNOSIS — E782 Mixed hyperlipidemia: Secondary | ICD-10-CM | POA: Diagnosis not present

## 2016-07-29 DIAGNOSIS — E782 Mixed hyperlipidemia: Secondary | ICD-10-CM | POA: Diagnosis not present

## 2016-07-29 DIAGNOSIS — D689 Coagulation defect, unspecified: Secondary | ICD-10-CM | POA: Diagnosis not present

## 2016-07-29 DIAGNOSIS — I871 Compression of vein: Secondary | ICD-10-CM | POA: Diagnosis not present

## 2016-07-29 DIAGNOSIS — D631 Anemia in chronic kidney disease: Secondary | ICD-10-CM | POA: Diagnosis not present

## 2016-07-29 DIAGNOSIS — E1129 Type 2 diabetes mellitus with other diabetic kidney complication: Secondary | ICD-10-CM | POA: Diagnosis not present

## 2016-07-29 DIAGNOSIS — T82858A Stenosis of vascular prosthetic devices, implants and grafts, initial encounter: Secondary | ICD-10-CM | POA: Diagnosis not present

## 2016-07-29 DIAGNOSIS — N2581 Secondary hyperparathyroidism of renal origin: Secondary | ICD-10-CM | POA: Diagnosis not present

## 2016-07-29 DIAGNOSIS — Z992 Dependence on renal dialysis: Secondary | ICD-10-CM | POA: Diagnosis not present

## 2016-07-29 DIAGNOSIS — E875 Hyperkalemia: Secondary | ICD-10-CM | POA: Diagnosis not present

## 2016-07-29 DIAGNOSIS — E119 Type 2 diabetes mellitus without complications: Secondary | ICD-10-CM | POA: Diagnosis not present

## 2016-07-29 DIAGNOSIS — N186 End stage renal disease: Secondary | ICD-10-CM | POA: Diagnosis not present

## 2016-07-30 DIAGNOSIS — E782 Mixed hyperlipidemia: Secondary | ICD-10-CM | POA: Diagnosis not present

## 2016-07-31 DIAGNOSIS — E1129 Type 2 diabetes mellitus with other diabetic kidney complication: Secondary | ICD-10-CM | POA: Diagnosis not present

## 2016-07-31 DIAGNOSIS — E119 Type 2 diabetes mellitus without complications: Secondary | ICD-10-CM | POA: Diagnosis not present

## 2016-07-31 DIAGNOSIS — D631 Anemia in chronic kidney disease: Secondary | ICD-10-CM | POA: Diagnosis not present

## 2016-07-31 DIAGNOSIS — E875 Hyperkalemia: Secondary | ICD-10-CM | POA: Diagnosis not present

## 2016-07-31 DIAGNOSIS — N2581 Secondary hyperparathyroidism of renal origin: Secondary | ICD-10-CM | POA: Diagnosis not present

## 2016-07-31 DIAGNOSIS — N186 End stage renal disease: Secondary | ICD-10-CM | POA: Diagnosis not present

## 2016-07-31 DIAGNOSIS — E782 Mixed hyperlipidemia: Secondary | ICD-10-CM | POA: Diagnosis not present

## 2016-07-31 DIAGNOSIS — D689 Coagulation defect, unspecified: Secondary | ICD-10-CM | POA: Diagnosis not present

## 2016-08-01 DIAGNOSIS — E782 Mixed hyperlipidemia: Secondary | ICD-10-CM | POA: Diagnosis not present

## 2016-08-02 DIAGNOSIS — N186 End stage renal disease: Secondary | ICD-10-CM | POA: Diagnosis not present

## 2016-08-02 DIAGNOSIS — E119 Type 2 diabetes mellitus without complications: Secondary | ICD-10-CM | POA: Diagnosis not present

## 2016-08-02 DIAGNOSIS — D689 Coagulation defect, unspecified: Secondary | ICD-10-CM | POA: Diagnosis not present

## 2016-08-02 DIAGNOSIS — D631 Anemia in chronic kidney disease: Secondary | ICD-10-CM | POA: Diagnosis not present

## 2016-08-02 DIAGNOSIS — E782 Mixed hyperlipidemia: Secondary | ICD-10-CM | POA: Diagnosis not present

## 2016-08-02 DIAGNOSIS — E875 Hyperkalemia: Secondary | ICD-10-CM | POA: Diagnosis not present

## 2016-08-02 DIAGNOSIS — E1129 Type 2 diabetes mellitus with other diabetic kidney complication: Secondary | ICD-10-CM | POA: Diagnosis not present

## 2016-08-02 DIAGNOSIS — N2581 Secondary hyperparathyroidism of renal origin: Secondary | ICD-10-CM | POA: Diagnosis not present

## 2016-08-03 DIAGNOSIS — E782 Mixed hyperlipidemia: Secondary | ICD-10-CM | POA: Diagnosis not present

## 2016-08-04 DIAGNOSIS — E782 Mixed hyperlipidemia: Secondary | ICD-10-CM | POA: Diagnosis not present

## 2016-08-05 DIAGNOSIS — D631 Anemia in chronic kidney disease: Secondary | ICD-10-CM | POA: Diagnosis not present

## 2016-08-05 DIAGNOSIS — E1129 Type 2 diabetes mellitus with other diabetic kidney complication: Secondary | ICD-10-CM | POA: Diagnosis not present

## 2016-08-05 DIAGNOSIS — E119 Type 2 diabetes mellitus without complications: Secondary | ICD-10-CM | POA: Diagnosis not present

## 2016-08-05 DIAGNOSIS — E782 Mixed hyperlipidemia: Secondary | ICD-10-CM | POA: Diagnosis not present

## 2016-08-05 DIAGNOSIS — N186 End stage renal disease: Secondary | ICD-10-CM | POA: Diagnosis not present

## 2016-08-05 DIAGNOSIS — D689 Coagulation defect, unspecified: Secondary | ICD-10-CM | POA: Diagnosis not present

## 2016-08-05 DIAGNOSIS — N2581 Secondary hyperparathyroidism of renal origin: Secondary | ICD-10-CM | POA: Diagnosis not present

## 2016-08-05 DIAGNOSIS — E875 Hyperkalemia: Secondary | ICD-10-CM | POA: Diagnosis not present

## 2016-08-06 DIAGNOSIS — E782 Mixed hyperlipidemia: Secondary | ICD-10-CM | POA: Diagnosis not present

## 2016-08-07 DIAGNOSIS — N2581 Secondary hyperparathyroidism of renal origin: Secondary | ICD-10-CM | POA: Diagnosis not present

## 2016-08-07 DIAGNOSIS — N186 End stage renal disease: Secondary | ICD-10-CM | POA: Diagnosis not present

## 2016-08-07 DIAGNOSIS — E119 Type 2 diabetes mellitus without complications: Secondary | ICD-10-CM | POA: Diagnosis not present

## 2016-08-07 DIAGNOSIS — E875 Hyperkalemia: Secondary | ICD-10-CM | POA: Diagnosis not present

## 2016-08-07 DIAGNOSIS — D631 Anemia in chronic kidney disease: Secondary | ICD-10-CM | POA: Diagnosis not present

## 2016-08-07 DIAGNOSIS — E782 Mixed hyperlipidemia: Secondary | ICD-10-CM | POA: Diagnosis not present

## 2016-08-07 DIAGNOSIS — D689 Coagulation defect, unspecified: Secondary | ICD-10-CM | POA: Diagnosis not present

## 2016-08-07 DIAGNOSIS — E1129 Type 2 diabetes mellitus with other diabetic kidney complication: Secondary | ICD-10-CM | POA: Diagnosis not present

## 2016-08-08 DIAGNOSIS — E782 Mixed hyperlipidemia: Secondary | ICD-10-CM | POA: Diagnosis not present

## 2016-08-09 DIAGNOSIS — D689 Coagulation defect, unspecified: Secondary | ICD-10-CM | POA: Diagnosis not present

## 2016-08-09 DIAGNOSIS — D631 Anemia in chronic kidney disease: Secondary | ICD-10-CM | POA: Diagnosis not present

## 2016-08-09 DIAGNOSIS — E875 Hyperkalemia: Secondary | ICD-10-CM | POA: Diagnosis not present

## 2016-08-09 DIAGNOSIS — E1129 Type 2 diabetes mellitus with other diabetic kidney complication: Secondary | ICD-10-CM | POA: Diagnosis not present

## 2016-08-09 DIAGNOSIS — E119 Type 2 diabetes mellitus without complications: Secondary | ICD-10-CM | POA: Diagnosis not present

## 2016-08-09 DIAGNOSIS — N2581 Secondary hyperparathyroidism of renal origin: Secondary | ICD-10-CM | POA: Diagnosis not present

## 2016-08-09 DIAGNOSIS — N186 End stage renal disease: Secondary | ICD-10-CM | POA: Diagnosis not present

## 2016-08-09 DIAGNOSIS — E782 Mixed hyperlipidemia: Secondary | ICD-10-CM | POA: Diagnosis not present

## 2016-08-10 DIAGNOSIS — E782 Mixed hyperlipidemia: Secondary | ICD-10-CM | POA: Diagnosis not present

## 2016-08-11 DIAGNOSIS — E782 Mixed hyperlipidemia: Secondary | ICD-10-CM | POA: Diagnosis not present

## 2016-08-11 DIAGNOSIS — I5032 Chronic diastolic (congestive) heart failure: Secondary | ICD-10-CM | POA: Diagnosis not present

## 2016-08-11 DIAGNOSIS — R05 Cough: Secondary | ICD-10-CM | POA: Diagnosis not present

## 2016-08-11 DIAGNOSIS — I2510 Atherosclerotic heart disease of native coronary artery without angina pectoris: Secondary | ICD-10-CM | POA: Diagnosis not present

## 2016-08-11 DIAGNOSIS — N186 End stage renal disease: Secondary | ICD-10-CM | POA: Diagnosis not present

## 2016-08-12 DIAGNOSIS — D689 Coagulation defect, unspecified: Secondary | ICD-10-CM | POA: Diagnosis not present

## 2016-08-12 DIAGNOSIS — D631 Anemia in chronic kidney disease: Secondary | ICD-10-CM | POA: Diagnosis not present

## 2016-08-12 DIAGNOSIS — E1129 Type 2 diabetes mellitus with other diabetic kidney complication: Secondary | ICD-10-CM | POA: Diagnosis not present

## 2016-08-12 DIAGNOSIS — E119 Type 2 diabetes mellitus without complications: Secondary | ICD-10-CM | POA: Diagnosis not present

## 2016-08-12 DIAGNOSIS — N2581 Secondary hyperparathyroidism of renal origin: Secondary | ICD-10-CM | POA: Diagnosis not present

## 2016-08-12 DIAGNOSIS — E782 Mixed hyperlipidemia: Secondary | ICD-10-CM | POA: Diagnosis not present

## 2016-08-12 DIAGNOSIS — N186 End stage renal disease: Secondary | ICD-10-CM | POA: Diagnosis not present

## 2016-08-12 DIAGNOSIS — E875 Hyperkalemia: Secondary | ICD-10-CM | POA: Diagnosis not present

## 2016-08-13 DIAGNOSIS — E782 Mixed hyperlipidemia: Secondary | ICD-10-CM | POA: Diagnosis not present

## 2016-08-14 DIAGNOSIS — D631 Anemia in chronic kidney disease: Secondary | ICD-10-CM | POA: Diagnosis not present

## 2016-08-14 DIAGNOSIS — E119 Type 2 diabetes mellitus without complications: Secondary | ICD-10-CM | POA: Diagnosis not present

## 2016-08-14 DIAGNOSIS — N2581 Secondary hyperparathyroidism of renal origin: Secondary | ICD-10-CM | POA: Diagnosis not present

## 2016-08-14 DIAGNOSIS — E782 Mixed hyperlipidemia: Secondary | ICD-10-CM | POA: Diagnosis not present

## 2016-08-14 DIAGNOSIS — D689 Coagulation defect, unspecified: Secondary | ICD-10-CM | POA: Diagnosis not present

## 2016-08-14 DIAGNOSIS — E1129 Type 2 diabetes mellitus with other diabetic kidney complication: Secondary | ICD-10-CM | POA: Diagnosis not present

## 2016-08-14 DIAGNOSIS — N186 End stage renal disease: Secondary | ICD-10-CM | POA: Diagnosis not present

## 2016-08-14 DIAGNOSIS — E875 Hyperkalemia: Secondary | ICD-10-CM | POA: Diagnosis not present

## 2016-08-15 DIAGNOSIS — E782 Mixed hyperlipidemia: Secondary | ICD-10-CM | POA: Diagnosis not present

## 2016-08-16 DIAGNOSIS — D631 Anemia in chronic kidney disease: Secondary | ICD-10-CM | POA: Diagnosis not present

## 2016-08-16 DIAGNOSIS — N186 End stage renal disease: Secondary | ICD-10-CM | POA: Diagnosis not present

## 2016-08-16 DIAGNOSIS — E1129 Type 2 diabetes mellitus with other diabetic kidney complication: Secondary | ICD-10-CM | POA: Diagnosis not present

## 2016-08-16 DIAGNOSIS — N2581 Secondary hyperparathyroidism of renal origin: Secondary | ICD-10-CM | POA: Diagnosis not present

## 2016-08-16 DIAGNOSIS — D689 Coagulation defect, unspecified: Secondary | ICD-10-CM | POA: Diagnosis not present

## 2016-08-16 DIAGNOSIS — E119 Type 2 diabetes mellitus without complications: Secondary | ICD-10-CM | POA: Diagnosis not present

## 2016-08-16 DIAGNOSIS — E782 Mixed hyperlipidemia: Secondary | ICD-10-CM | POA: Diagnosis not present

## 2016-08-16 DIAGNOSIS — E875 Hyperkalemia: Secondary | ICD-10-CM | POA: Diagnosis not present

## 2016-08-17 DIAGNOSIS — E782 Mixed hyperlipidemia: Secondary | ICD-10-CM | POA: Diagnosis not present

## 2016-08-18 DIAGNOSIS — E782 Mixed hyperlipidemia: Secondary | ICD-10-CM | POA: Diagnosis not present

## 2016-08-18 IMAGING — CT CT ABD-PELV W/O CM
1 of 2 series · 15 of 32 positions shown, 19 images · non-contrast
Comparison: CT the abdomen pelvis of 05/07/2012

CLINICAL DATA: Lower abdomen and pelvic pain for 5 months,
constipation and diarrhea, history of chronic renal disease

EXAM:
CT ABDOMEN AND PELVIS WITHOUT CONTRAST
TECHNIQUE: Multidetector CT imaging of the abdomen and pelvis was performed
following the standard protocol without IV contrast.

[Series 2: abd/pelvis w/(date) · axial · 0.92mm/px · z∈[-488,-28]mm · 15 of 100 slices shown, 19 images]
[im 4/100  soft-tissue]
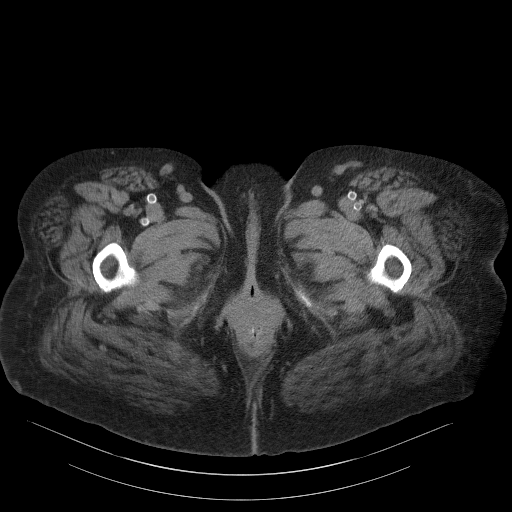
[im 4/100  bone]
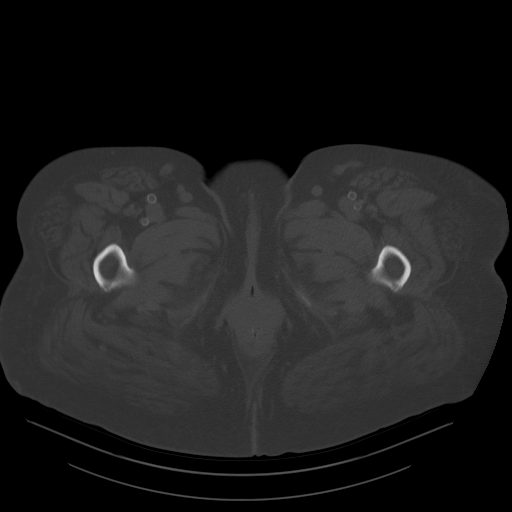
[im 12/100  soft-tissue]
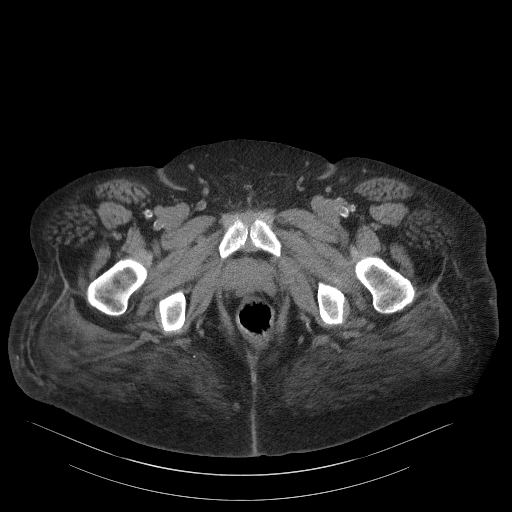
[im 20/100  soft-tissue]
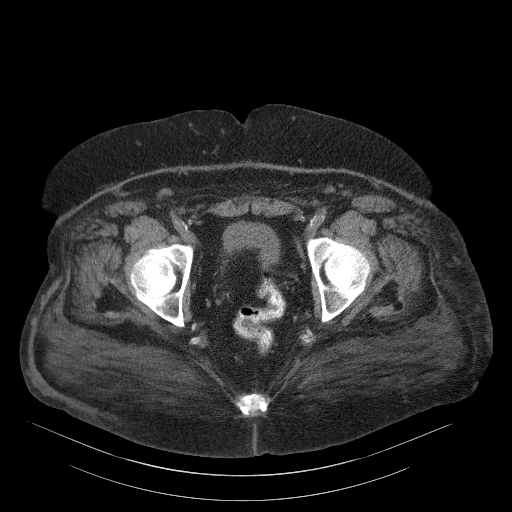
[im 28/100  soft-tissue]
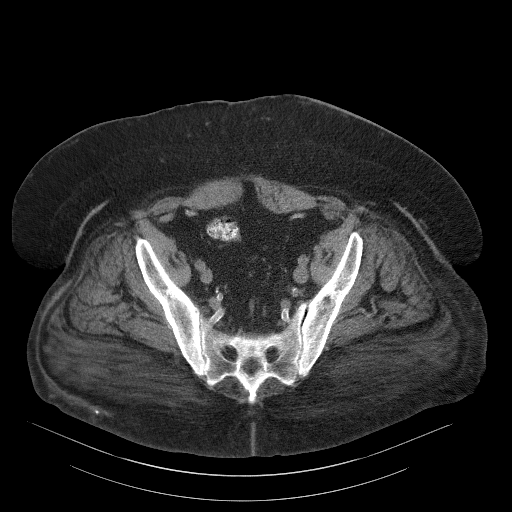
[im 36/100  soft-tissue]
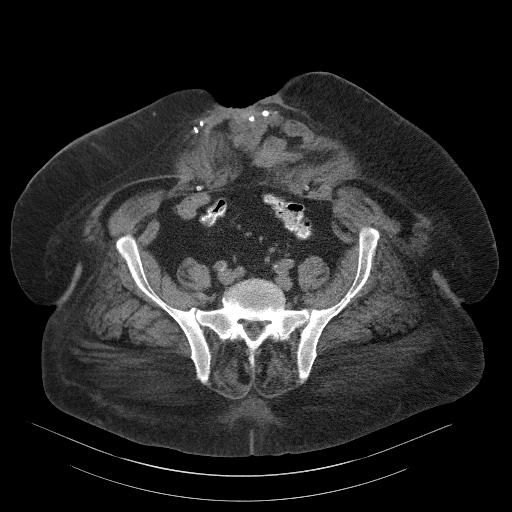
[im 44/100  soft-tissue]
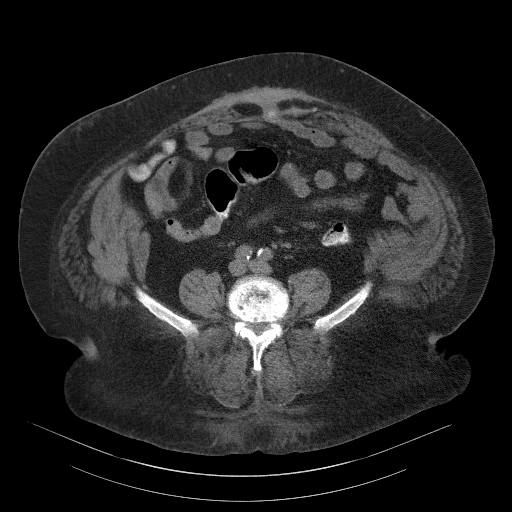
[im 52/100  soft-tissue]
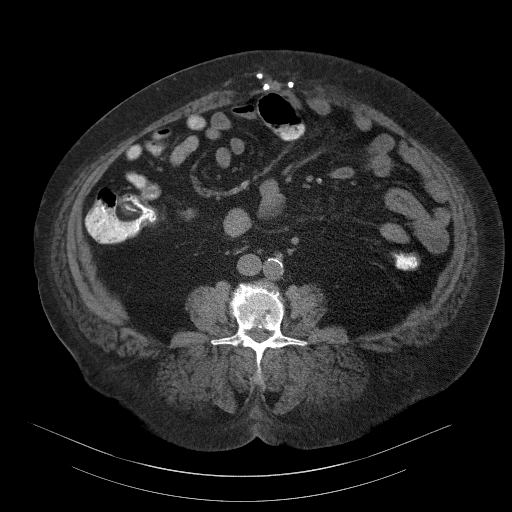
[im 56/100  soft-tissue]
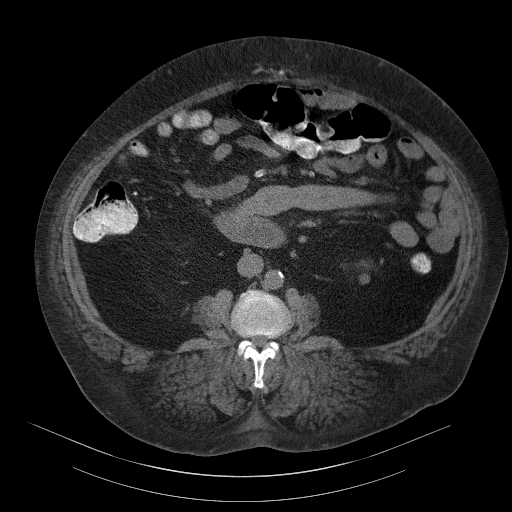
[im 64/100  soft-tissue]
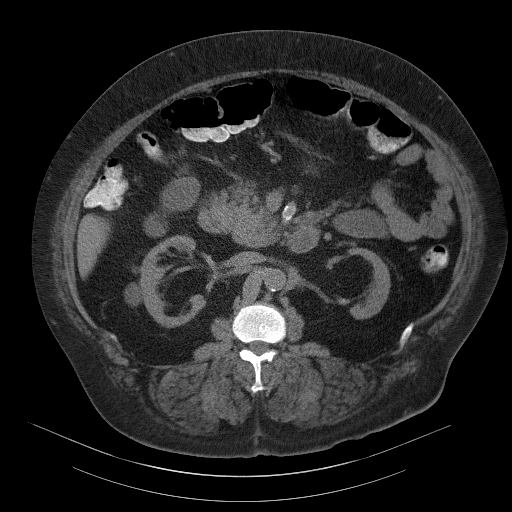
[im 64/100  bone]
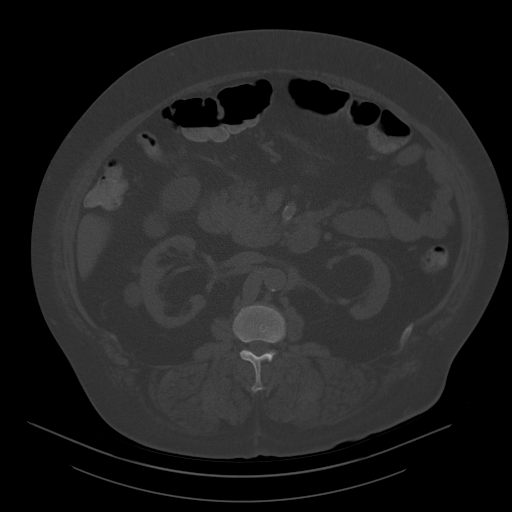
[im 72/100  soft-tissue]
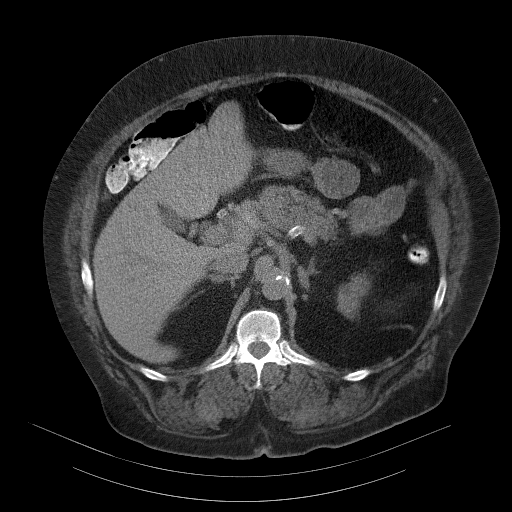
[im 80/100  soft-tissue]
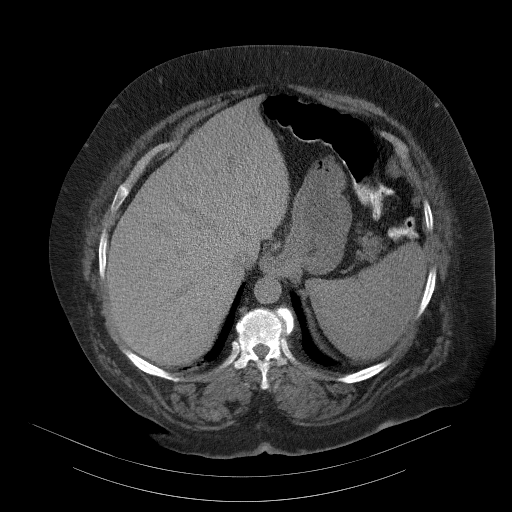
[im 84/100  lung]
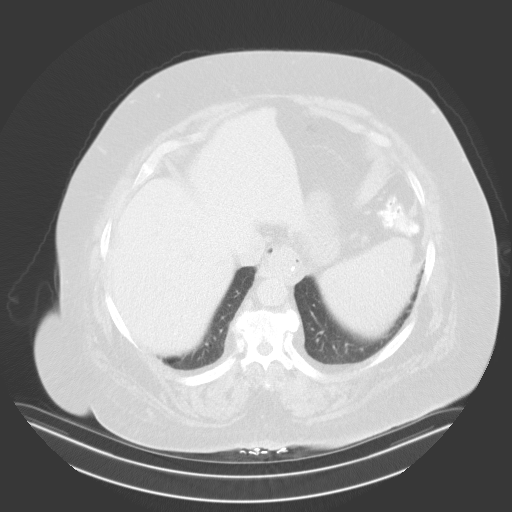
[im 88/100  soft-tissue]
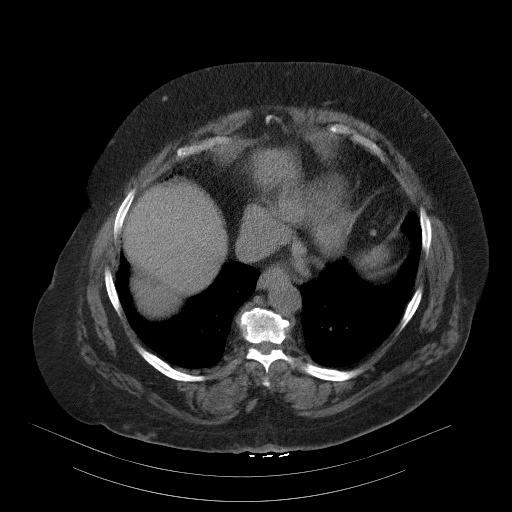
[im 88/100  lung]
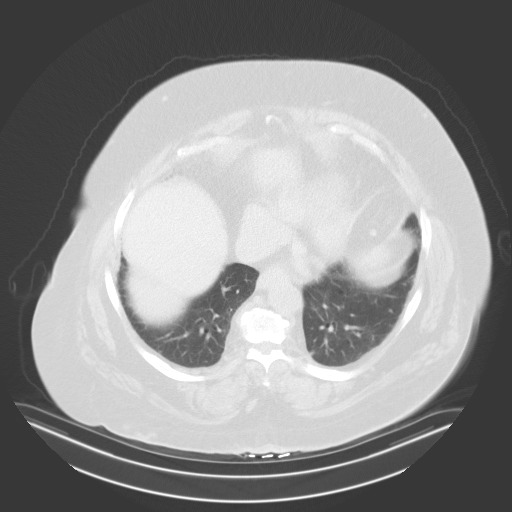
[im 92/100  lung]
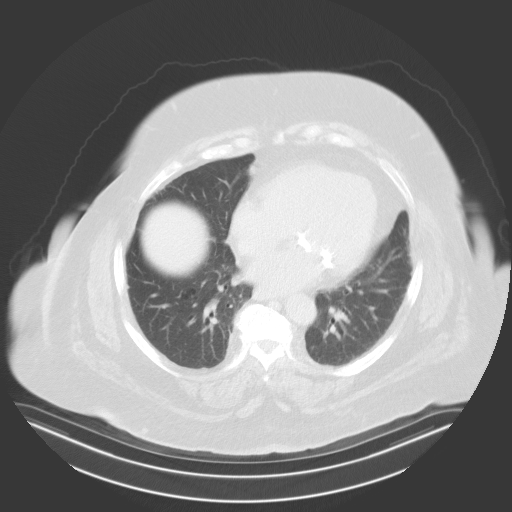
[im 96/100  soft-tissue]
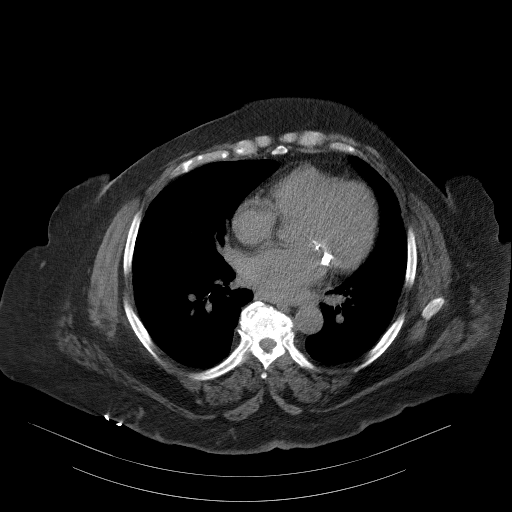
[im 96/100  lung]
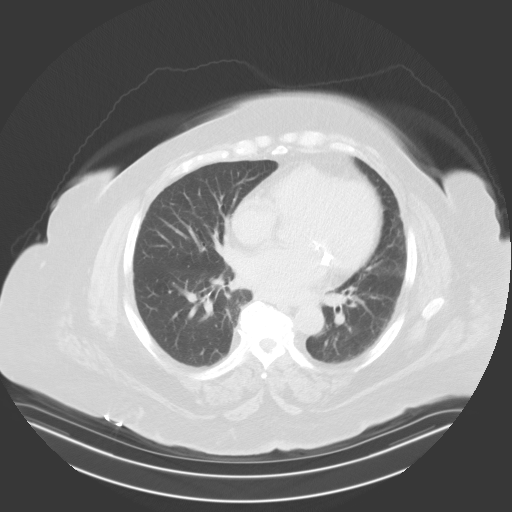

[15 of 32 positions shown; findings below may reference images not displayed]

FINDINGS: The lung bases are clear. The heart is mildly enlarged and there is
calcification of the mitral annulus noted. The liver is unremarkable
on this unenhanced scan. No calcified gallstones are seen. The
pancreas is normal in size and the pancreatic duct is not dilated.
The adrenal glands and spleen are unremarkable. The stomach is not
well distended. The kidneys appear atrophic with cortical thinning.
Bilateral low-attenuation renal structures are most consistent with
cysts, but difficult to assess on this unenhanced study. These
low-attenuation renal lesions do not appear to have changed
significantly in the interval. No hydronephrosis is seen. The
abdominal aorta is normal in caliber with moderate atherosclerotic
change present. No adenopathy is seen. Irregularity of the anterior
midline abdominal wall is noted from prior hernia repair with
surgical mesh present.

Just below the upper portion of mesh there is again noted a midline
ventral hernia. This has not changed significantly compared to the
prior CT. The urinary bladder is decompressed and cannot be
evaluated. The uterus has previously been resected. No adnexal
lesion is seen. No fluid is noted within the pelvis. The terminal
ileum is unremarkable. The lumbar vertebrae remain in normal
alignment. There is degenerative disc disease primarily at L4-5.
IMPRESSION: 1. No explanation for the patient's abdomen and pelvic pain is seen.
2. There is little change in the previously noted ventral hernia
with surgical mesh from prior hernia repair ventrally as well.
3. Renal atrophy with cortical thinning bilaterally. No
hydronephrosis.

## 2016-08-19 DIAGNOSIS — E875 Hyperkalemia: Secondary | ICD-10-CM | POA: Diagnosis not present

## 2016-08-19 DIAGNOSIS — N2581 Secondary hyperparathyroidism of renal origin: Secondary | ICD-10-CM | POA: Diagnosis not present

## 2016-08-19 DIAGNOSIS — E119 Type 2 diabetes mellitus without complications: Secondary | ICD-10-CM | POA: Diagnosis not present

## 2016-08-19 DIAGNOSIS — E1129 Type 2 diabetes mellitus with other diabetic kidney complication: Secondary | ICD-10-CM | POA: Diagnosis not present

## 2016-08-19 DIAGNOSIS — Z992 Dependence on renal dialysis: Secondary | ICD-10-CM | POA: Diagnosis not present

## 2016-08-19 DIAGNOSIS — E782 Mixed hyperlipidemia: Secondary | ICD-10-CM | POA: Diagnosis not present

## 2016-08-19 DIAGNOSIS — N186 End stage renal disease: Secondary | ICD-10-CM | POA: Diagnosis not present

## 2016-08-19 DIAGNOSIS — D689 Coagulation defect, unspecified: Secondary | ICD-10-CM | POA: Diagnosis not present

## 2016-08-19 DIAGNOSIS — D631 Anemia in chronic kidney disease: Secondary | ICD-10-CM | POA: Diagnosis not present

## 2016-08-20 DIAGNOSIS — E782 Mixed hyperlipidemia: Secondary | ICD-10-CM | POA: Diagnosis not present

## 2016-08-21 DIAGNOSIS — E875 Hyperkalemia: Secondary | ICD-10-CM | POA: Diagnosis not present

## 2016-08-21 DIAGNOSIS — E782 Mixed hyperlipidemia: Secondary | ICD-10-CM | POA: Diagnosis not present

## 2016-08-21 DIAGNOSIS — N186 End stage renal disease: Secondary | ICD-10-CM | POA: Diagnosis not present

## 2016-08-21 DIAGNOSIS — N2581 Secondary hyperparathyroidism of renal origin: Secondary | ICD-10-CM | POA: Diagnosis not present

## 2016-08-21 DIAGNOSIS — E119 Type 2 diabetes mellitus without complications: Secondary | ICD-10-CM | POA: Diagnosis not present

## 2016-08-21 DIAGNOSIS — D631 Anemia in chronic kidney disease: Secondary | ICD-10-CM | POA: Diagnosis not present

## 2016-08-21 DIAGNOSIS — D689 Coagulation defect, unspecified: Secondary | ICD-10-CM | POA: Diagnosis not present

## 2016-08-22 DIAGNOSIS — E782 Mixed hyperlipidemia: Secondary | ICD-10-CM | POA: Diagnosis not present

## 2016-08-23 DIAGNOSIS — E875 Hyperkalemia: Secondary | ICD-10-CM | POA: Diagnosis not present

## 2016-08-23 DIAGNOSIS — E119 Type 2 diabetes mellitus without complications: Secondary | ICD-10-CM | POA: Diagnosis not present

## 2016-08-23 DIAGNOSIS — E782 Mixed hyperlipidemia: Secondary | ICD-10-CM | POA: Diagnosis not present

## 2016-08-23 DIAGNOSIS — N2581 Secondary hyperparathyroidism of renal origin: Secondary | ICD-10-CM | POA: Diagnosis not present

## 2016-08-23 DIAGNOSIS — N186 End stage renal disease: Secondary | ICD-10-CM | POA: Diagnosis not present

## 2016-08-23 DIAGNOSIS — D631 Anemia in chronic kidney disease: Secondary | ICD-10-CM | POA: Diagnosis not present

## 2016-08-23 DIAGNOSIS — D689 Coagulation defect, unspecified: Secondary | ICD-10-CM | POA: Diagnosis not present

## 2016-08-24 DIAGNOSIS — E782 Mixed hyperlipidemia: Secondary | ICD-10-CM | POA: Diagnosis not present

## 2016-08-25 DIAGNOSIS — E782 Mixed hyperlipidemia: Secondary | ICD-10-CM | POA: Diagnosis not present

## 2016-08-26 DIAGNOSIS — E782 Mixed hyperlipidemia: Secondary | ICD-10-CM | POA: Diagnosis not present

## 2016-08-26 DIAGNOSIS — D689 Coagulation defect, unspecified: Secondary | ICD-10-CM | POA: Diagnosis not present

## 2016-08-26 DIAGNOSIS — E119 Type 2 diabetes mellitus without complications: Secondary | ICD-10-CM | POA: Diagnosis not present

## 2016-08-26 DIAGNOSIS — E875 Hyperkalemia: Secondary | ICD-10-CM | POA: Diagnosis not present

## 2016-08-26 DIAGNOSIS — N2581 Secondary hyperparathyroidism of renal origin: Secondary | ICD-10-CM | POA: Diagnosis not present

## 2016-08-26 DIAGNOSIS — D631 Anemia in chronic kidney disease: Secondary | ICD-10-CM | POA: Diagnosis not present

## 2016-08-26 DIAGNOSIS — N186 End stage renal disease: Secondary | ICD-10-CM | POA: Diagnosis not present

## 2016-08-27 DIAGNOSIS — E782 Mixed hyperlipidemia: Secondary | ICD-10-CM | POA: Diagnosis not present

## 2016-08-28 DIAGNOSIS — E782 Mixed hyperlipidemia: Secondary | ICD-10-CM | POA: Diagnosis not present

## 2016-08-28 DIAGNOSIS — E119 Type 2 diabetes mellitus without complications: Secondary | ICD-10-CM | POA: Diagnosis not present

## 2016-08-28 DIAGNOSIS — N186 End stage renal disease: Secondary | ICD-10-CM | POA: Diagnosis not present

## 2016-08-28 DIAGNOSIS — D631 Anemia in chronic kidney disease: Secondary | ICD-10-CM | POA: Diagnosis not present

## 2016-08-28 DIAGNOSIS — N2581 Secondary hyperparathyroidism of renal origin: Secondary | ICD-10-CM | POA: Diagnosis not present

## 2016-08-28 DIAGNOSIS — D689 Coagulation defect, unspecified: Secondary | ICD-10-CM | POA: Diagnosis not present

## 2016-08-28 DIAGNOSIS — E875 Hyperkalemia: Secondary | ICD-10-CM | POA: Diagnosis not present

## 2016-08-29 DIAGNOSIS — E782 Mixed hyperlipidemia: Secondary | ICD-10-CM | POA: Diagnosis not present

## 2016-08-30 DIAGNOSIS — N2581 Secondary hyperparathyroidism of renal origin: Secondary | ICD-10-CM | POA: Diagnosis not present

## 2016-08-30 DIAGNOSIS — E875 Hyperkalemia: Secondary | ICD-10-CM | POA: Diagnosis not present

## 2016-08-30 DIAGNOSIS — N186 End stage renal disease: Secondary | ICD-10-CM | POA: Diagnosis not present

## 2016-08-30 DIAGNOSIS — D689 Coagulation defect, unspecified: Secondary | ICD-10-CM | POA: Diagnosis not present

## 2016-08-30 DIAGNOSIS — E782 Mixed hyperlipidemia: Secondary | ICD-10-CM | POA: Diagnosis not present

## 2016-08-30 DIAGNOSIS — D631 Anemia in chronic kidney disease: Secondary | ICD-10-CM | POA: Diagnosis not present

## 2016-08-30 DIAGNOSIS — E119 Type 2 diabetes mellitus without complications: Secondary | ICD-10-CM | POA: Diagnosis not present

## 2016-08-31 DIAGNOSIS — E782 Mixed hyperlipidemia: Secondary | ICD-10-CM | POA: Diagnosis not present

## 2016-09-01 DIAGNOSIS — E782 Mixed hyperlipidemia: Secondary | ICD-10-CM | POA: Diagnosis not present

## 2016-09-02 DIAGNOSIS — N2581 Secondary hyperparathyroidism of renal origin: Secondary | ICD-10-CM | POA: Diagnosis not present

## 2016-09-02 DIAGNOSIS — E119 Type 2 diabetes mellitus without complications: Secondary | ICD-10-CM | POA: Diagnosis not present

## 2016-09-02 DIAGNOSIS — E782 Mixed hyperlipidemia: Secondary | ICD-10-CM | POA: Diagnosis not present

## 2016-09-02 DIAGNOSIS — D689 Coagulation defect, unspecified: Secondary | ICD-10-CM | POA: Diagnosis not present

## 2016-09-02 DIAGNOSIS — E875 Hyperkalemia: Secondary | ICD-10-CM | POA: Diagnosis not present

## 2016-09-02 DIAGNOSIS — N186 End stage renal disease: Secondary | ICD-10-CM | POA: Diagnosis not present

## 2016-09-02 DIAGNOSIS — D631 Anemia in chronic kidney disease: Secondary | ICD-10-CM | POA: Diagnosis not present

## 2016-09-03 DIAGNOSIS — E782 Mixed hyperlipidemia: Secondary | ICD-10-CM | POA: Diagnosis not present

## 2016-09-04 DIAGNOSIS — E875 Hyperkalemia: Secondary | ICD-10-CM | POA: Diagnosis not present

## 2016-09-04 DIAGNOSIS — N2581 Secondary hyperparathyroidism of renal origin: Secondary | ICD-10-CM | POA: Diagnosis not present

## 2016-09-04 DIAGNOSIS — D689 Coagulation defect, unspecified: Secondary | ICD-10-CM | POA: Diagnosis not present

## 2016-09-04 DIAGNOSIS — N186 End stage renal disease: Secondary | ICD-10-CM | POA: Diagnosis not present

## 2016-09-04 DIAGNOSIS — E119 Type 2 diabetes mellitus without complications: Secondary | ICD-10-CM | POA: Diagnosis not present

## 2016-09-04 DIAGNOSIS — E782 Mixed hyperlipidemia: Secondary | ICD-10-CM | POA: Diagnosis not present

## 2016-09-04 DIAGNOSIS — D631 Anemia in chronic kidney disease: Secondary | ICD-10-CM | POA: Diagnosis not present

## 2016-09-05 DIAGNOSIS — E782 Mixed hyperlipidemia: Secondary | ICD-10-CM | POA: Diagnosis not present

## 2016-09-06 DIAGNOSIS — E782 Mixed hyperlipidemia: Secondary | ICD-10-CM | POA: Diagnosis not present

## 2016-09-06 DIAGNOSIS — D631 Anemia in chronic kidney disease: Secondary | ICD-10-CM | POA: Diagnosis not present

## 2016-09-06 DIAGNOSIS — E119 Type 2 diabetes mellitus without complications: Secondary | ICD-10-CM | POA: Diagnosis not present

## 2016-09-06 DIAGNOSIS — E875 Hyperkalemia: Secondary | ICD-10-CM | POA: Diagnosis not present

## 2016-09-06 DIAGNOSIS — D689 Coagulation defect, unspecified: Secondary | ICD-10-CM | POA: Diagnosis not present

## 2016-09-06 DIAGNOSIS — N2581 Secondary hyperparathyroidism of renal origin: Secondary | ICD-10-CM | POA: Diagnosis not present

## 2016-09-06 DIAGNOSIS — N186 End stage renal disease: Secondary | ICD-10-CM | POA: Diagnosis not present

## 2016-09-07 DIAGNOSIS — E782 Mixed hyperlipidemia: Secondary | ICD-10-CM | POA: Diagnosis not present

## 2016-09-08 DIAGNOSIS — E782 Mixed hyperlipidemia: Secondary | ICD-10-CM | POA: Diagnosis not present

## 2016-09-09 DIAGNOSIS — N2581 Secondary hyperparathyroidism of renal origin: Secondary | ICD-10-CM | POA: Diagnosis not present

## 2016-09-09 DIAGNOSIS — N186 End stage renal disease: Secondary | ICD-10-CM | POA: Diagnosis not present

## 2016-09-09 DIAGNOSIS — E875 Hyperkalemia: Secondary | ICD-10-CM | POA: Diagnosis not present

## 2016-09-09 DIAGNOSIS — E119 Type 2 diabetes mellitus without complications: Secondary | ICD-10-CM | POA: Diagnosis not present

## 2016-09-09 DIAGNOSIS — E782 Mixed hyperlipidemia: Secondary | ICD-10-CM | POA: Diagnosis not present

## 2016-09-09 DIAGNOSIS — D689 Coagulation defect, unspecified: Secondary | ICD-10-CM | POA: Diagnosis not present

## 2016-09-09 DIAGNOSIS — D631 Anemia in chronic kidney disease: Secondary | ICD-10-CM | POA: Diagnosis not present

## 2016-09-10 DIAGNOSIS — E782 Mixed hyperlipidemia: Secondary | ICD-10-CM | POA: Diagnosis not present

## 2016-09-11 DIAGNOSIS — E875 Hyperkalemia: Secondary | ICD-10-CM | POA: Diagnosis not present

## 2016-09-11 DIAGNOSIS — N2581 Secondary hyperparathyroidism of renal origin: Secondary | ICD-10-CM | POA: Diagnosis not present

## 2016-09-11 DIAGNOSIS — D631 Anemia in chronic kidney disease: Secondary | ICD-10-CM | POA: Diagnosis not present

## 2016-09-11 DIAGNOSIS — E119 Type 2 diabetes mellitus without complications: Secondary | ICD-10-CM | POA: Diagnosis not present

## 2016-09-11 DIAGNOSIS — D689 Coagulation defect, unspecified: Secondary | ICD-10-CM | POA: Diagnosis not present

## 2016-09-11 DIAGNOSIS — N186 End stage renal disease: Secondary | ICD-10-CM | POA: Diagnosis not present

## 2016-09-11 DIAGNOSIS — E782 Mixed hyperlipidemia: Secondary | ICD-10-CM | POA: Diagnosis not present

## 2016-09-12 DIAGNOSIS — N186 End stage renal disease: Secondary | ICD-10-CM | POA: Diagnosis not present

## 2016-09-12 DIAGNOSIS — E875 Hyperkalemia: Secondary | ICD-10-CM | POA: Diagnosis not present

## 2016-09-12 DIAGNOSIS — D631 Anemia in chronic kidney disease: Secondary | ICD-10-CM | POA: Diagnosis not present

## 2016-09-12 DIAGNOSIS — D689 Coagulation defect, unspecified: Secondary | ICD-10-CM | POA: Diagnosis not present

## 2016-09-12 DIAGNOSIS — E782 Mixed hyperlipidemia: Secondary | ICD-10-CM | POA: Diagnosis not present

## 2016-09-12 DIAGNOSIS — E119 Type 2 diabetes mellitus without complications: Secondary | ICD-10-CM | POA: Diagnosis not present

## 2016-09-12 DIAGNOSIS — N2581 Secondary hyperparathyroidism of renal origin: Secondary | ICD-10-CM | POA: Diagnosis not present

## 2016-09-13 DIAGNOSIS — E782 Mixed hyperlipidemia: Secondary | ICD-10-CM | POA: Diagnosis not present

## 2016-09-14 DIAGNOSIS — E782 Mixed hyperlipidemia: Secondary | ICD-10-CM | POA: Diagnosis not present

## 2016-09-15 DIAGNOSIS — E782 Mixed hyperlipidemia: Secondary | ICD-10-CM | POA: Diagnosis not present

## 2016-09-16 DIAGNOSIS — N2581 Secondary hyperparathyroidism of renal origin: Secondary | ICD-10-CM | POA: Diagnosis not present

## 2016-09-16 DIAGNOSIS — D689 Coagulation defect, unspecified: Secondary | ICD-10-CM | POA: Diagnosis not present

## 2016-09-16 DIAGNOSIS — E875 Hyperkalemia: Secondary | ICD-10-CM | POA: Diagnosis not present

## 2016-09-16 DIAGNOSIS — N186 End stage renal disease: Secondary | ICD-10-CM | POA: Diagnosis not present

## 2016-09-16 DIAGNOSIS — E782 Mixed hyperlipidemia: Secondary | ICD-10-CM | POA: Diagnosis not present

## 2016-09-16 DIAGNOSIS — D631 Anemia in chronic kidney disease: Secondary | ICD-10-CM | POA: Diagnosis not present

## 2016-09-16 DIAGNOSIS — E119 Type 2 diabetes mellitus without complications: Secondary | ICD-10-CM | POA: Diagnosis not present

## 2016-09-17 DIAGNOSIS — E782 Mixed hyperlipidemia: Secondary | ICD-10-CM | POA: Diagnosis not present

## 2016-09-18 DIAGNOSIS — D631 Anemia in chronic kidney disease: Secondary | ICD-10-CM | POA: Diagnosis not present

## 2016-09-18 DIAGNOSIS — E119 Type 2 diabetes mellitus without complications: Secondary | ICD-10-CM | POA: Diagnosis not present

## 2016-09-18 DIAGNOSIS — N186 End stage renal disease: Secondary | ICD-10-CM | POA: Diagnosis not present

## 2016-09-18 DIAGNOSIS — E875 Hyperkalemia: Secondary | ICD-10-CM | POA: Diagnosis not present

## 2016-09-18 DIAGNOSIS — N2581 Secondary hyperparathyroidism of renal origin: Secondary | ICD-10-CM | POA: Diagnosis not present

## 2016-09-18 DIAGNOSIS — D689 Coagulation defect, unspecified: Secondary | ICD-10-CM | POA: Diagnosis not present

## 2016-09-18 DIAGNOSIS — E782 Mixed hyperlipidemia: Secondary | ICD-10-CM | POA: Diagnosis not present

## 2016-09-19 DIAGNOSIS — N186 End stage renal disease: Secondary | ICD-10-CM | POA: Diagnosis not present

## 2016-09-19 DIAGNOSIS — D631 Anemia in chronic kidney disease: Secondary | ICD-10-CM | POA: Diagnosis not present

## 2016-09-19 DIAGNOSIS — E119 Type 2 diabetes mellitus without complications: Secondary | ICD-10-CM | POA: Diagnosis not present

## 2016-09-19 DIAGNOSIS — E782 Mixed hyperlipidemia: Secondary | ICD-10-CM | POA: Diagnosis not present

## 2016-09-19 DIAGNOSIS — D689 Coagulation defect, unspecified: Secondary | ICD-10-CM | POA: Diagnosis not present

## 2016-09-19 DIAGNOSIS — E1129 Type 2 diabetes mellitus with other diabetic kidney complication: Secondary | ICD-10-CM | POA: Diagnosis not present

## 2016-09-19 DIAGNOSIS — E875 Hyperkalemia: Secondary | ICD-10-CM | POA: Diagnosis not present

## 2016-09-19 DIAGNOSIS — Z992 Dependence on renal dialysis: Secondary | ICD-10-CM | POA: Diagnosis not present

## 2016-09-19 DIAGNOSIS — N2581 Secondary hyperparathyroidism of renal origin: Secondary | ICD-10-CM | POA: Diagnosis not present

## 2016-09-20 DIAGNOSIS — E782 Mixed hyperlipidemia: Secondary | ICD-10-CM | POA: Diagnosis not present

## 2016-09-21 DIAGNOSIS — E782 Mixed hyperlipidemia: Secondary | ICD-10-CM | POA: Diagnosis not present

## 2016-09-23 DIAGNOSIS — D689 Coagulation defect, unspecified: Secondary | ICD-10-CM | POA: Diagnosis not present

## 2016-09-23 DIAGNOSIS — E782 Mixed hyperlipidemia: Secondary | ICD-10-CM | POA: Diagnosis not present

## 2016-09-23 DIAGNOSIS — N186 End stage renal disease: Secondary | ICD-10-CM | POA: Diagnosis not present

## 2016-09-23 DIAGNOSIS — D631 Anemia in chronic kidney disease: Secondary | ICD-10-CM | POA: Diagnosis not present

## 2016-09-23 DIAGNOSIS — N2581 Secondary hyperparathyroidism of renal origin: Secondary | ICD-10-CM | POA: Diagnosis not present

## 2016-09-24 DIAGNOSIS — E782 Mixed hyperlipidemia: Secondary | ICD-10-CM | POA: Diagnosis not present

## 2016-09-25 DIAGNOSIS — N2581 Secondary hyperparathyroidism of renal origin: Secondary | ICD-10-CM | POA: Diagnosis not present

## 2016-09-25 DIAGNOSIS — E782 Mixed hyperlipidemia: Secondary | ICD-10-CM | POA: Diagnosis not present

## 2016-09-25 DIAGNOSIS — D689 Coagulation defect, unspecified: Secondary | ICD-10-CM | POA: Diagnosis not present

## 2016-09-25 DIAGNOSIS — D631 Anemia in chronic kidney disease: Secondary | ICD-10-CM | POA: Diagnosis not present

## 2016-09-25 DIAGNOSIS — N186 End stage renal disease: Secondary | ICD-10-CM | POA: Diagnosis not present

## 2016-09-26 DIAGNOSIS — E1140 Type 2 diabetes mellitus with diabetic neuropathy, unspecified: Secondary | ICD-10-CM | POA: Diagnosis not present

## 2016-09-26 DIAGNOSIS — I10 Essential (primary) hypertension: Secondary | ICD-10-CM | POA: Diagnosis not present

## 2016-09-26 DIAGNOSIS — R0609 Other forms of dyspnea: Secondary | ICD-10-CM | POA: Diagnosis not present

## 2016-09-26 DIAGNOSIS — Z6841 Body Mass Index (BMI) 40.0 and over, adult: Secondary | ICD-10-CM | POA: Diagnosis not present

## 2016-09-26 DIAGNOSIS — E784 Other hyperlipidemia: Secondary | ICD-10-CM | POA: Diagnosis not present

## 2016-09-26 DIAGNOSIS — Z992 Dependence on renal dialysis: Secondary | ICD-10-CM | POA: Diagnosis not present

## 2016-09-26 DIAGNOSIS — E782 Mixed hyperlipidemia: Secondary | ICD-10-CM | POA: Diagnosis not present

## 2016-09-26 DIAGNOSIS — N186 End stage renal disease: Secondary | ICD-10-CM | POA: Diagnosis not present

## 2016-09-27 DIAGNOSIS — D689 Coagulation defect, unspecified: Secondary | ICD-10-CM | POA: Diagnosis not present

## 2016-09-27 DIAGNOSIS — N186 End stage renal disease: Secondary | ICD-10-CM | POA: Diagnosis not present

## 2016-09-27 DIAGNOSIS — E782 Mixed hyperlipidemia: Secondary | ICD-10-CM | POA: Diagnosis not present

## 2016-09-27 DIAGNOSIS — N2581 Secondary hyperparathyroidism of renal origin: Secondary | ICD-10-CM | POA: Diagnosis not present

## 2016-09-27 DIAGNOSIS — D631 Anemia in chronic kidney disease: Secondary | ICD-10-CM | POA: Diagnosis not present

## 2016-09-28 DIAGNOSIS — E782 Mixed hyperlipidemia: Secondary | ICD-10-CM | POA: Diagnosis not present

## 2016-09-29 DIAGNOSIS — E782 Mixed hyperlipidemia: Secondary | ICD-10-CM | POA: Diagnosis not present

## 2016-09-30 DIAGNOSIS — D631 Anemia in chronic kidney disease: Secondary | ICD-10-CM | POA: Diagnosis not present

## 2016-09-30 DIAGNOSIS — N186 End stage renal disease: Secondary | ICD-10-CM | POA: Diagnosis not present

## 2016-09-30 DIAGNOSIS — D689 Coagulation defect, unspecified: Secondary | ICD-10-CM | POA: Diagnosis not present

## 2016-09-30 DIAGNOSIS — N2581 Secondary hyperparathyroidism of renal origin: Secondary | ICD-10-CM | POA: Diagnosis not present

## 2016-09-30 DIAGNOSIS — E782 Mixed hyperlipidemia: Secondary | ICD-10-CM | POA: Diagnosis not present

## 2016-10-01 DIAGNOSIS — E782 Mixed hyperlipidemia: Secondary | ICD-10-CM | POA: Diagnosis not present

## 2016-10-02 DIAGNOSIS — E782 Mixed hyperlipidemia: Secondary | ICD-10-CM | POA: Diagnosis not present

## 2016-10-02 DIAGNOSIS — D631 Anemia in chronic kidney disease: Secondary | ICD-10-CM | POA: Diagnosis not present

## 2016-10-02 DIAGNOSIS — N2581 Secondary hyperparathyroidism of renal origin: Secondary | ICD-10-CM | POA: Diagnosis not present

## 2016-10-02 DIAGNOSIS — D689 Coagulation defect, unspecified: Secondary | ICD-10-CM | POA: Diagnosis not present

## 2016-10-02 DIAGNOSIS — N186 End stage renal disease: Secondary | ICD-10-CM | POA: Diagnosis not present

## 2016-10-03 DIAGNOSIS — E782 Mixed hyperlipidemia: Secondary | ICD-10-CM | POA: Diagnosis not present

## 2016-10-04 DIAGNOSIS — D631 Anemia in chronic kidney disease: Secondary | ICD-10-CM | POA: Diagnosis not present

## 2016-10-04 DIAGNOSIS — E782 Mixed hyperlipidemia: Secondary | ICD-10-CM | POA: Diagnosis not present

## 2016-10-04 DIAGNOSIS — N2581 Secondary hyperparathyroidism of renal origin: Secondary | ICD-10-CM | POA: Diagnosis not present

## 2016-10-04 DIAGNOSIS — D689 Coagulation defect, unspecified: Secondary | ICD-10-CM | POA: Diagnosis not present

## 2016-10-04 DIAGNOSIS — N186 End stage renal disease: Secondary | ICD-10-CM | POA: Diagnosis not present

## 2016-10-05 DIAGNOSIS — E782 Mixed hyperlipidemia: Secondary | ICD-10-CM | POA: Diagnosis not present

## 2016-10-06 DIAGNOSIS — E782 Mixed hyperlipidemia: Secondary | ICD-10-CM | POA: Diagnosis not present

## 2016-10-07 DIAGNOSIS — D631 Anemia in chronic kidney disease: Secondary | ICD-10-CM | POA: Diagnosis not present

## 2016-10-07 DIAGNOSIS — N186 End stage renal disease: Secondary | ICD-10-CM | POA: Diagnosis not present

## 2016-10-07 DIAGNOSIS — D689 Coagulation defect, unspecified: Secondary | ICD-10-CM | POA: Diagnosis not present

## 2016-10-07 DIAGNOSIS — E782 Mixed hyperlipidemia: Secondary | ICD-10-CM | POA: Diagnosis not present

## 2016-10-07 DIAGNOSIS — N2581 Secondary hyperparathyroidism of renal origin: Secondary | ICD-10-CM | POA: Diagnosis not present

## 2016-10-08 DIAGNOSIS — E782 Mixed hyperlipidemia: Secondary | ICD-10-CM | POA: Diagnosis not present

## 2016-10-09 DIAGNOSIS — D689 Coagulation defect, unspecified: Secondary | ICD-10-CM | POA: Diagnosis not present

## 2016-10-09 DIAGNOSIS — N2581 Secondary hyperparathyroidism of renal origin: Secondary | ICD-10-CM | POA: Diagnosis not present

## 2016-10-09 DIAGNOSIS — N186 End stage renal disease: Secondary | ICD-10-CM | POA: Diagnosis not present

## 2016-10-09 DIAGNOSIS — D631 Anemia in chronic kidney disease: Secondary | ICD-10-CM | POA: Diagnosis not present

## 2016-10-09 DIAGNOSIS — E782 Mixed hyperlipidemia: Secondary | ICD-10-CM | POA: Diagnosis not present

## 2016-10-10 DIAGNOSIS — E782 Mixed hyperlipidemia: Secondary | ICD-10-CM | POA: Diagnosis not present

## 2016-10-11 DIAGNOSIS — E782 Mixed hyperlipidemia: Secondary | ICD-10-CM | POA: Diagnosis not present

## 2016-10-11 DIAGNOSIS — N186 End stage renal disease: Secondary | ICD-10-CM | POA: Diagnosis not present

## 2016-10-11 DIAGNOSIS — D689 Coagulation defect, unspecified: Secondary | ICD-10-CM | POA: Diagnosis not present

## 2016-10-11 DIAGNOSIS — D631 Anemia in chronic kidney disease: Secondary | ICD-10-CM | POA: Diagnosis not present

## 2016-10-11 DIAGNOSIS — N2581 Secondary hyperparathyroidism of renal origin: Secondary | ICD-10-CM | POA: Diagnosis not present

## 2016-10-12 DIAGNOSIS — E782 Mixed hyperlipidemia: Secondary | ICD-10-CM | POA: Diagnosis not present

## 2016-10-13 ENCOUNTER — Emergency Department (HOSPITAL_COMMUNITY): Payer: Medicare HMO

## 2016-10-13 ENCOUNTER — Inpatient Hospital Stay (HOSPITAL_COMMUNITY)
Admission: EM | Admit: 2016-10-13 | Discharge: 2016-10-21 | DRG: 291 | Disposition: A | Discharge status: Home Health | Payer: Medicare HMO | Attending: Family Medicine | Admitting: Family Medicine

## 2016-10-13 ENCOUNTER — Encounter (HOSPITAL_COMMUNITY): Payer: Self-pay | Admitting: Emergency Medicine

## 2016-10-13 ENCOUNTER — Other Ambulatory Visit: Payer: Self-pay

## 2016-10-13 DIAGNOSIS — D72829 Elevated white blood cell count, unspecified: Secondary | ICD-10-CM | POA: Diagnosis present

## 2016-10-13 DIAGNOSIS — R0901 Asphyxia: Secondary | ICD-10-CM | POA: Diagnosis not present

## 2016-10-13 DIAGNOSIS — E1122 Type 2 diabetes mellitus with diabetic chronic kidney disease: Secondary | ICD-10-CM | POA: Diagnosis present

## 2016-10-13 DIAGNOSIS — I459 Conduction disorder, unspecified: Secondary | ICD-10-CM | POA: Diagnosis present

## 2016-10-13 DIAGNOSIS — Z6841 Body Mass Index (BMI) 40.0 and over, adult: Secondary | ICD-10-CM

## 2016-10-13 DIAGNOSIS — I080 Rheumatic disorders of both mitral and aortic valves: Secondary | ICD-10-CM | POA: Diagnosis present

## 2016-10-13 DIAGNOSIS — Z79899 Other long term (current) drug therapy: Secondary | ICD-10-CM

## 2016-10-13 DIAGNOSIS — J810 Acute pulmonary edema: Secondary | ICD-10-CM | POA: Diagnosis not present

## 2016-10-13 DIAGNOSIS — R0902 Hypoxemia: Secondary | ICD-10-CM | POA: Insufficient documentation

## 2016-10-13 DIAGNOSIS — R0602 Shortness of breath: Secondary | ICD-10-CM

## 2016-10-13 DIAGNOSIS — D631 Anemia in chronic kidney disease: Secondary | ICD-10-CM | POA: Diagnosis not present

## 2016-10-13 DIAGNOSIS — J439 Emphysema, unspecified: Secondary | ICD-10-CM | POA: Diagnosis not present

## 2016-10-13 DIAGNOSIS — I5031 Acute diastolic (congestive) heart failure: Secondary | ICD-10-CM | POA: Diagnosis not present

## 2016-10-13 DIAGNOSIS — E8770 Fluid overload, unspecified: Secondary | ICD-10-CM | POA: Diagnosis not present

## 2016-10-13 DIAGNOSIS — E8889 Other specified metabolic disorders: Secondary | ICD-10-CM | POA: Diagnosis present

## 2016-10-13 DIAGNOSIS — H5461 Unqualified visual loss, right eye, normal vision left eye: Secondary | ICD-10-CM | POA: Diagnosis present

## 2016-10-13 DIAGNOSIS — I272 Pulmonary hypertension, unspecified: Secondary | ICD-10-CM | POA: Diagnosis present

## 2016-10-13 DIAGNOSIS — M1990 Unspecified osteoarthritis, unspecified site: Secondary | ICD-10-CM | POA: Diagnosis present

## 2016-10-13 DIAGNOSIS — I2510 Atherosclerotic heart disease of native coronary artery without angina pectoris: Secondary | ICD-10-CM | POA: Diagnosis present

## 2016-10-13 DIAGNOSIS — Z794 Long term (current) use of insulin: Secondary | ICD-10-CM

## 2016-10-13 DIAGNOSIS — D649 Anemia, unspecified: Secondary | ICD-10-CM | POA: Diagnosis not present

## 2016-10-13 DIAGNOSIS — E1140 Type 2 diabetes mellitus with diabetic neuropathy, unspecified: Secondary | ICD-10-CM | POA: Diagnosis present

## 2016-10-13 DIAGNOSIS — G8929 Other chronic pain: Secondary | ICD-10-CM | POA: Diagnosis present

## 2016-10-13 DIAGNOSIS — Z9861 Coronary angioplasty status: Secondary | ICD-10-CM

## 2016-10-13 DIAGNOSIS — J9811 Atelectasis: Secondary | ICD-10-CM | POA: Diagnosis present

## 2016-10-13 DIAGNOSIS — I252 Old myocardial infarction: Secondary | ICD-10-CM

## 2016-10-13 DIAGNOSIS — E118 Type 2 diabetes mellitus with unspecified complications: Secondary | ICD-10-CM

## 2016-10-13 DIAGNOSIS — M545 Low back pain: Secondary | ICD-10-CM | POA: Diagnosis present

## 2016-10-13 DIAGNOSIS — N186 End stage renal disease: Secondary | ICD-10-CM | POA: Diagnosis present

## 2016-10-13 DIAGNOSIS — N189 Chronic kidney disease, unspecified: Secondary | ICD-10-CM

## 2016-10-13 DIAGNOSIS — Z9981 Dependence on supplemental oxygen: Secondary | ICD-10-CM | POA: Diagnosis not present

## 2016-10-13 DIAGNOSIS — E785 Hyperlipidemia, unspecified: Secondary | ICD-10-CM | POA: Diagnosis present

## 2016-10-13 DIAGNOSIS — R0609 Other forms of dyspnea: Secondary | ICD-10-CM | POA: Diagnosis not present

## 2016-10-13 DIAGNOSIS — E1129 Type 2 diabetes mellitus with other diabetic kidney complication: Secondary | ICD-10-CM

## 2016-10-13 DIAGNOSIS — I5043 Acute on chronic combined systolic (congestive) and diastolic (congestive) heart failure: Secondary | ICD-10-CM | POA: Diagnosis not present

## 2016-10-13 DIAGNOSIS — I120 Hypertensive chronic kidney disease with stage 5 chronic kidney disease or end stage renal disease: Secondary | ICD-10-CM | POA: Diagnosis not present

## 2016-10-13 DIAGNOSIS — Z992 Dependence on renal dialysis: Secondary | ICD-10-CM | POA: Diagnosis not present

## 2016-10-13 DIAGNOSIS — Z96652 Presence of left artificial knee joint: Secondary | ICD-10-CM | POA: Diagnosis present

## 2016-10-13 DIAGNOSIS — N2581 Secondary hyperparathyroidism of renal origin: Secondary | ICD-10-CM | POA: Diagnosis present

## 2016-10-13 DIAGNOSIS — R079 Chest pain, unspecified: Secondary | ICD-10-CM | POA: Diagnosis not present

## 2016-10-13 DIAGNOSIS — I132 Hypertensive heart and chronic kidney disease with heart failure and with stage 5 chronic kidney disease, or end stage renal disease: Secondary | ICD-10-CM | POA: Diagnosis not present

## 2016-10-13 DIAGNOSIS — J449 Chronic obstructive pulmonary disease, unspecified: Secondary | ICD-10-CM | POA: Diagnosis not present

## 2016-10-13 DIAGNOSIS — G4733 Obstructive sleep apnea (adult) (pediatric): Secondary | ICD-10-CM | POA: Diagnosis present

## 2016-10-13 DIAGNOSIS — Z8673 Personal history of transient ischemic attack (TIA), and cerebral infarction without residual deficits: Secondary | ICD-10-CM

## 2016-10-13 DIAGNOSIS — J9601 Acute respiratory failure with hypoxia: Secondary | ICD-10-CM | POA: Diagnosis not present

## 2016-10-13 DIAGNOSIS — Z981 Arthrodesis status: Secondary | ICD-10-CM

## 2016-10-13 DIAGNOSIS — J441 Chronic obstructive pulmonary disease with (acute) exacerbation: Secondary | ICD-10-CM | POA: Diagnosis not present

## 2016-10-13 DIAGNOSIS — J9600 Acute respiratory failure, unspecified whether with hypoxia or hypercapnia: Secondary | ICD-10-CM

## 2016-10-13 DIAGNOSIS — I270 Primary pulmonary hypertension: Secondary | ICD-10-CM | POA: Diagnosis not present

## 2016-10-13 DIAGNOSIS — J208 Acute bronchitis due to other specified organisms: Secondary | ICD-10-CM | POA: Diagnosis not present

## 2016-10-13 DIAGNOSIS — Z9989 Dependence on other enabling machines and devices: Secondary | ICD-10-CM

## 2016-10-13 DIAGNOSIS — Z87891 Personal history of nicotine dependence: Secondary | ICD-10-CM

## 2016-10-13 DIAGNOSIS — Z7982 Long term (current) use of aspirin: Secondary | ICD-10-CM

## 2016-10-13 LAB — PREPARE RBC (CROSSMATCH)

## 2016-10-13 LAB — CBC WITH DIFFERENTIAL/PLATELET
Basophils Absolute: 0 10*3/uL (ref 0.0–0.1)
Basophils Relative: 0 %
Eosinophils Absolute: 0.2 10*3/uL (ref 0.0–0.7)
Eosinophils Relative: 2 %
HEMATOCRIT: 30.2 % — AB (ref 36.0–46.0)
HEMOGLOBIN: 9.4 g/dL — AB (ref 12.0–15.0)
LYMPHS ABS: 1.9 10*3/uL (ref 0.7–4.0)
Lymphocytes Relative: 19 %
MCH: 28.4 pg (ref 26.0–34.0)
MCHC: 31.1 g/dL (ref 30.0–36.0)
MCV: 91.2 fL (ref 78.0–100.0)
MONO ABS: 1.4 10*3/uL — AB (ref 0.1–1.0)
MONOS PCT: 13 %
NEUTROS ABS: 6.7 10*3/uL (ref 1.7–7.7)
NEUTROS PCT: 66 %
Platelets: 160 10*3/uL (ref 150–400)
RBC: 3.31 MIL/uL — ABNORMAL LOW (ref 3.87–5.11)
RDW: 16.3 % — ABNORMAL HIGH (ref 11.5–15.5)
WBC: 10.1 10*3/uL (ref 4.0–10.5)

## 2016-10-13 LAB — CBC
HEMATOCRIT: 15 % — AB (ref 36.0–46.0)
HEMOGLOBIN: 4.7 g/dL — AB (ref 12.0–15.0)
MCH: 28.7 pg (ref 26.0–34.0)
MCHC: 31.3 g/dL (ref 30.0–36.0)
MCV: 91.5 fL (ref 78.0–100.0)
Platelets: 259 10*3/uL (ref 150–400)
RBC: 1.64 MIL/uL — ABNORMAL LOW (ref 3.87–5.11)
RDW: 16.1 % — AB (ref 11.5–15.5)
WBC: 15.3 10*3/uL — ABNORMAL HIGH (ref 4.0–10.5)

## 2016-10-13 LAB — BASIC METABOLIC PANEL
Anion gap: 13 (ref 5–15)
BUN: 39 mg/dL — AB (ref 6–20)
CO2: 31 mmol/L (ref 22–32)
CREATININE: 9.51 mg/dL — AB (ref 0.44–1.00)
Calcium: 9.3 mg/dL (ref 8.9–10.3)
Chloride: 90 mmol/L — ABNORMAL LOW (ref 101–111)
GFR, EST AFRICAN AMERICAN: 4 mL/min — AB (ref >60)
GFR, EST NON AFRICAN AMERICAN: 4 mL/min — AB (ref >60)
Glucose, Bld: 164 mg/dL — ABNORMAL HIGH (ref 65–99)
POTASSIUM: 4.4 mmol/L (ref 3.5–5.1)
SODIUM: 134 mmol/L — AB (ref 135–145)

## 2016-10-13 LAB — POC OCCULT BLOOD, ED: FECAL OCCULT BLD: NEGATIVE

## 2016-10-13 LAB — PHOSPHORUS: PHOSPHORUS: 3.9 mg/dL (ref 2.5–4.6)

## 2016-10-13 LAB — I-STAT TROPONIN, ED
Troponin i, poc: 0.11 ng/mL (ref 0.00–0.08)
Troponin i, poc: 0.14 ng/mL (ref 0.00–0.08)

## 2016-10-13 LAB — BRAIN NATRIURETIC PEPTIDE: B Natriuretic Peptide: 533.8 pg/mL — ABNORMAL HIGH (ref 0.0–100.0)

## 2016-10-13 LAB — MAGNESIUM: MAGNESIUM: 2.4 mg/dL (ref 1.7–2.4)

## 2016-10-13 LAB — D-DIMER, QUANTITATIVE (NOT AT ARMC): D DIMER QUANT: 2.38 ug{FEU}/mL — AB (ref 0.00–0.50)

## 2016-10-13 LAB — HEMOGLOBIN A1C
HEMOGLOBIN A1C: 6.1 % — AB (ref 4.8–5.6)
Mean Plasma Glucose: 128.37 mg/dL

## 2016-10-13 LAB — CBG MONITORING, ED: GLUCOSE-CAPILLARY: 122 mg/dL — AB (ref 65–99)

## 2016-10-13 LAB — GLUCOSE, CAPILLARY: GLUCOSE-CAPILLARY: 195 mg/dL — AB (ref 65–99)

## 2016-10-13 MED ORDER — ACETAMINOPHEN 650 MG RE SUPP: 650.0000 mg | Freq: Four times a day (QID) | RECTAL | Status: DC | PRN

## 2016-10-13 MED ORDER — DOCUSATE SODIUM 100 MG PO CAPS
100.0000 mg | ORAL_CAPSULE | Freq: Every day | ORAL | Status: DC
  Administered 2016-10-13 – 2016-10-21 (×7): 100 mg via ORAL
  Filled 2016-10-13 (×7): qty 1

## 2016-10-13 MED ORDER — INSULIN ASPART 100 UNIT/ML ~~LOC~~ SOLN
0.0000 [IU] | Freq: Three times a day (TID) | SUBCUTANEOUS | Status: DC
  Administered 2016-10-13 – 2016-10-15 (×4): 1 [IU] via SUBCUTANEOUS
  Administered 2016-10-16 – 2016-10-17 (×4): 2 [IU] via SUBCUTANEOUS
  Administered 2016-10-18: 5 [IU] via SUBCUTANEOUS
  Administered 2016-10-19 (×2): 3 [IU] via SUBCUTANEOUS
  Administered 2016-10-20 (×2): 5 [IU] via SUBCUTANEOUS
  Administered 2016-10-21: 7 [IU] via SUBCUTANEOUS
  Filled 2016-10-13: qty 1

## 2016-10-13 MED ORDER — ONDANSETRON HCL 4 MG/2ML IJ SOLN: 4.0000 mg | Freq: Four times a day (QID) | INTRAMUSCULAR | Status: DC | PRN

## 2016-10-13 MED ORDER — CINACALCET HCL 30 MG PO TABS
90.0000 mg | ORAL_TABLET | Freq: Every day | ORAL | Status: DC
  Administered 2016-10-14 – 2016-10-20 (×5): 90 mg via ORAL
  Filled 2016-10-13 (×6): qty 3

## 2016-10-13 MED ORDER — SEVELAMER CARBONATE 800 MG PO TABS
800.0000 mg | ORAL_TABLET | Freq: Three times a day (TID) | ORAL | Status: DC
  Administered 2016-10-14 – 2016-10-21 (×18): 800 mg via ORAL
  Filled 2016-10-13 (×20): qty 1

## 2016-10-13 MED ORDER — ASPIRIN EC 81 MG PO TBEC
81.0000 mg | DELAYED_RELEASE_TABLET | Freq: Every day | ORAL | Status: DC
  Administered 2016-10-14 – 2016-10-21 (×8): 81 mg via ORAL
  Filled 2016-10-13 (×8): qty 1

## 2016-10-13 MED ORDER — INSULIN GLARGINE 100 UNIT/ML ~~LOC~~ SOLN
5.0000 [IU] | Freq: Every day | SUBCUTANEOUS | Status: DC
  Administered 2016-10-13 – 2016-10-18 (×6): 5 [IU] via SUBCUTANEOUS
  Filled 2016-10-13 (×6): qty 0.05

## 2016-10-13 MED ORDER — ACETAMINOPHEN 325 MG PO TABS
650.0000 mg | ORAL_TABLET | Freq: Four times a day (QID) | ORAL | Status: DC | PRN
  Administered 2016-10-16: 650 mg via ORAL

## 2016-10-13 MED ORDER — ATORVASTATIN CALCIUM 40 MG PO TABS
40.0000 mg | ORAL_TABLET | Freq: Every day | ORAL | Status: DC
  Administered 2016-10-14 – 2016-10-21 (×8): 40 mg via ORAL
  Filled 2016-10-13 (×8): qty 1

## 2016-10-13 MED ORDER — SODIUM CHLORIDE 0.9 % IV SOLN: Freq: Once | INTRAVENOUS | Status: DC

## 2016-10-13 MED ORDER — MIDODRINE HCL 5 MG PO TABS
5.0000 mg | ORAL_TABLET | Freq: Three times a day (TID) | ORAL | Status: DC
  Administered 2016-10-14 – 2016-10-21 (×23): 5 mg via ORAL
  Filled 2016-10-13 (×19): qty 1

## 2016-10-13 MED ORDER — BISACODYL 5 MG PO TBEC
10.0000 mg | DELAYED_RELEASE_TABLET | Freq: Every day | ORAL | Status: DC | PRN
  Administered 2016-10-17: 10 mg via ORAL
  Filled 2016-10-13: qty 2

## 2016-10-13 MED ORDER — INSULIN ASPART 100 UNIT/ML ~~LOC~~ SOLN
0.0000 [IU] | Freq: Every day | SUBCUTANEOUS | Status: DC
  Administered 2016-10-14: 2 [IU] via SUBCUTANEOUS
  Administered 2016-10-17: 4 [IU] via SUBCUTANEOUS
  Administered 2016-10-18 – 2016-10-20 (×2): 5 [IU] via SUBCUTANEOUS
  Administered 2016-10-21: 3 [IU] via SUBCUTANEOUS

## 2016-10-13 MED ORDER — ONDANSETRON HCL 4 MG PO TABS: 4.0000 mg | ORAL_TABLET | Freq: Four times a day (QID) | ORAL | Status: DC | PRN

## 2016-10-13 NOTE — H&P (Signed)
History and Physical    [name] AOZ:[id number] DOB: 08/19/1945 DOA: 10/13/2016  PCP: [name], MD Patient coming from: home  Chief Complaint: sob  HPI: RASCHELLE [name] is a 71 y.o. female with medical history significant of *diabetes, OSA, MI/CAD, ESRD, hypertension, anemia, hiatal hernia, right eye blindness.  Patient reports her primary complaint shortness of breath. Patient typically dialyzes on Tuesday Thursday and Saturday with last full session of dialysis on Saturday prior to admission, 10/11/2016. Patient presenting with shortness of breath that started the morning of admission upon waking up in the morning. Patient does report one day history of significant fatigue increasing fatigue and weakness after attending church. Denies any shortness breath or chest pain at that time. Patient states that during her dialysis session she had more fluid removed and her typical. Denies any medication changes, fevers, nausea, vomiting, abdominal pain, flank pain, neck stiffness, focal neurological deficit. Symptoms are worse with exertion  And when lying down flat. Denies any cough or wheezing.    ED Course: objective findings below.   Review of Systems: As per HPI otherwise all other systems reviewed and are negative  Ambulatory Status: limited due to age.  Past Medical History:  Diagnosis Date  . Abdominal abscess 12-17-10   abdominal abscesses x2 ? one at this time  . Anemia   . Arthritis    "all over" (08/23/2012)  . Blind right eye    fell and crushed socket and eye fell out, was replaced at [facility]  . CHF (congestive heart failure) ([place])   . Chronic lower back pain   . Coronary artery disease    Dr Kadakia-cardiologist 2-3 x year  . Dyspnea   . Dysrhythmia    irregular, "skips beats"  . ESRD (end stage renal disease) on dialysis ([place])    "started 12/2011; [place]; TTS" (08/23/2012  . Exertional shortness of breath   . Eye drainage    "lots; since fall 07/2011"  (08/23/2012)  . Gout   . H/O hiatal hernia   . Headache(784.0)    "weekly sometimes; since I fell and hit my head in 07/2011 08/23/2012)  . Heart murmur   . History of blood transfusion    "lots before starting dialysis" (08/23/2012)  . Hypertension    sees Dr. Willey Blade  . Inhalation injury    "worked at [employer]; can't inhale polyurethane or paint, etc" (08/23/2012)  . Myocardial infarction ([place]) 1970's or 1980's  . Neuromuscular disorder ([place])    diabetic neuropathy  . OSA on CPAP   . Staph aureus infection November 2012   . Swelling of both ankles 12-17-10  . Type II diabetes mellitus ([place])    "over 10 years now" (08/23/2012)    Past Surgical History:  Procedure Laterality Date  . APPENDECTOMY    . AV FISTULA PLACEMENT  09/30/2011   Procedure: ARTERIOVENOUS (AV) FISTULA CREATION;  Surgeon: Conrad Oak Hill, MD;  Location: [place];  Service: Vascular;  Laterality: Left;  BRACHIAL-CEPHALIC  . BACK SURGERY    . BASCILIC VEIN TRANSPOSITION Left 11/10/2011   Left arm   . [place] TRANSPOSITION  01/28/2012   Procedure: BASCILIC VEIN TRANSPOSITION;  Surgeon: Conrad Grand, MD;  Location: [place];  Service: Vascular;  Laterality: Left;  Second Stage   . CARDIAC CATHETERIZATION  1980's  . CATARACT EXTRACTION W/ INTRAOCULAR LENS IMPLANT Bilateral 1990's  . DILATION AND CURETTAGE OF UTERUS  1960's  . EYE SURGERY    . HERNIA  REPAIR    . INSERTION OF DIALYSIS CATHETER  01/05/2012   Procedure: INSERTION OF DIALYSIS CATHETER;  Surgeon: Conrad , MD;  Location: [place];  Service: Vascular;  Laterality: N/A;  Right Internal Jugular Placement  . INTRAOCULAR PROSTHESES INSERTION Right 2013   "fell; knocked my eye outl" (08/23/2012)  . JOINT REPLACEMENT    . LAPAROTOMY  12/18/2010   Procedure: EXPLORATORY LAPAROTOMY;  Surgeon: Rolm Bookbinder, MD;  Location: [facility];  Service: General;  Laterality: N/A;  Abdominal Seroma Evacuation  . LEFT HEART CATHETERIZATION WITH CORONARY ANGIOGRAM N/A  08/25/2012   Procedure: LEFT HEART CATHETERIZATION WITH CORONARY ANGIOGRAM;  Surgeon: Birdie Riddle, MD;  Location: [place] [facility];  Service: Cardiovascular;  Laterality: N/A;  . LUMBAR DISC SURGERY  1970'-80's   X 3  . TOTAL KNEE ARTHROPLASTY Left 1980's  . VAGINAL HYSTERECTOMY  1970's  . VENTRAL HERNIA REPAIR  2012-2013   component separation, repair with biologic; "had 3 surgeries to fix it" (08/23/2012)  . WOUND DEBRIDEMENT  03/20/2011   Procedure: DEBRIDEMENT ABDOMINAL WOUND;  Surgeon: Rolm Bookbinder, MD;  Location: [place];  Service: General;  Laterality: N/A;  debridement abdominal wall, placement of wound vac    Social History   Social History  . Marital status: Widowed    Spouse name: N/A  . Number of children: N/A  . Years of education: N/A   Occupational History  . Not on file.   Social History Main Topics  . Smoking status: Former Smoker    Packs/day: 0.25    Years: 16.00    Types: Cigarettes    Quit date: 12/16/1980  . Smokeless tobacco: Never Used  . Alcohol use No  . Drug use: No  . Sexual activity: No   Other Topics Concern  . Not on file   Social History Narrative   ** Merged History Encounter **        No Known Allergies  Family History  Problem Relation Age of Onset  . Hypertension Mother   . Cancer Brother        spine  . Hypertension Father   . Cancer Sister        brain  . Hypertension Maternal Grandmother   . Anesthesia problems Neg Hx       Prior to Admission medications   Medication Sig Start Date End Date Taking? Authorizing Provider  albuterol (PROVENTIL HFA;VENTOLIN HFA) 108 (90 BASE) MCG/ACT inhaler Inhale 2 puffs into the lungs every 4 (four) hours as needed for wheezing or shortness of breath.    [provider]  albuterol (PROVENTIL) (2.5 MG/3ML) 0.083% nebulizer solution Take 3 mLs (2.5 mg total) by nebulization every 6 (six) hours as needed for wheezing or shortness of breath. 02/12/16   Dixie Dials, MD  aspirin EC 81  MG tablet Take 81 mg by mouth daily.      [provider]  atorvastatin (LIPITOR) 40 MG tablet Take 40 mg by mouth daily.    Dixie Dials, MD  bisacodyl (DULCOLAX) 5 MG EC tablet Take 10 mg by mouth daily as needed for moderate constipation.     [provider]  Calcium Carbonate-Vitamin D (CALCIUM 600 + D PO) Take 1 tablet by mouth daily.     [provider]  diphenhydrAMINE (BENADRYL) 25 mg capsule Take 50 mg by mouth every 6 (six) hours as needed.    [provider]  docusate sodium (COLACE) 100 MG capsule Take 100 mg by mouth daily.  [provider]  ethyl chloride spray Apply 1 application topically daily as needed. For HD 01/10/16   [provider]  insulin glargine (LANTUS) 100 UNIT/ML injection Inject 5-10 Units into the skin at bedtime. Patient uses sliding scale.    [provider]  midodrine (PROAMATINE) 5 MG tablet Take 1 tablet (5 mg total) by mouth 3 (three) times daily with meals. 02/12/16   Dixie Dials, MD  Multiple Vitamin (MULITIVITAMIN WITH MINERALS) TABS Take 1 tablet by mouth daily.    [provider]  nitroGLYCERIN (NITROSTAT) 0.4 MG SL tablet Place 0.4 mg under the tongue every 5 (five) minutes as needed for chest pain.    [provider]  SENSIPAR 90 MG tablet Take 90 mg by mouth daily. 11/30/14   [provider]  sevelamer carbonate (RENVELA) 800 MG tablet Take 1 tablet (800 mg total) by mouth 3 (three) times daily with meals. 02/12/16   Dixie Dials, MD    Physical Exam: Vitals:   10/13/16 1050 10/13/16 1312  BP: (!) 91/45 (!) 93/53  Pulse: 69 68  Resp: (!) 22 17  Temp: 97.6 F (36.4 C)   TempSrc: Oral   SpO2: 100% 100%     General:  Appears calm and comfortable Eyes:  PERRL, EOMI, normal lids, iris ENT:  grossly normal hearing, lips & tongue, mmm Neck:  no LAD, masses or thyromegaly Cardiovascular:  RRR, no m/r/g. Trace LE edema. LUE thrill Respiratory: Diminished  in the bases with crackles. Increased effort on 2 L nasal cannula. Abdomen:  soft, ntnd, NABS Skin:  no rash or induration seen on limited exam Musculoskeletal:  grossly normal tone BUE/BLE, good ROM, no bony abnormality Psychiatric:  grossly normal mood and affect, speech fluent and appropriate, AOx3 Neurologic:  CN 2-12 grossly intact, moves all extremities in coordinated fashion, sensation intact  Labs on Admission: I have personally reviewed following labs and imaging studies  CBC:  Recent Labs Lab 10/13/16 1112  WBC 15.3*  HGB 4.7*  HCT 15.0*  MCV 91.5  PLT 154   Basic Metabolic Panel:  Recent Labs Lab 10/13/16 1218  NA 134*  K 4.4  CL 90*  CO2 31  GLUCOSE 164*  BUN 39*  CREATININE 9.51*  CALCIUM 9.3   GFR: CrCl cannot be calculated (Unknown ideal weight.). Liver Function Tests: No results for input(s): AST, ALT, ALKPHOS, BILITOT, PROT, ALBUMIN in the last 168 hours. No results for input(s): LIPASE, AMYLASE in the last 168 hours. No results for input(s): AMMONIA in the last 168 hours. Coagulation Profile: No results for input(s): INR, PROTIME in the last 168 hours. Cardiac Enzymes: No results for input(s): CKTOTAL, CKMB, CKMBINDEX, TROPONINI in the last 168 hours. BNP (last 3 results) No results for input(s): PROBNP in the last 8760 hours. HbA1C: No results for input(s): HGBA1C in the last 72 hours. CBG: No results for input(s): GLUCAP in the last 168 hours. Lipid Profile: No results for input(s): CHOL, HDL, LDLCALC, TRIG, CHOLHDL, LDLDIRECT in the last 72 hours. Thyroid Function Tests: No results for input(s): TSH, T4TOTAL, FREET4, T3FREE, THYROIDAB in the last 72 hours. Anemia Panel: No results for input(s): VITAMINB12, FOLATE, FERRITIN, TIBC, IRON, RETICCTPCT in the last 72 hours. Urine analysis:    Component Value Date/Time   COLORURINE YELLOW 07/02/2010 2127   APPEARANCEUR CLOUDY (A) 07/02/2010 2127   LABSPEC 1.011 07/02/2010 2127   PHURINE 6.5  07/02/2010 2127   GLUCOSEU 250 (A) 07/02/2010 2127   HGBUR SMALL (A) 07/02/2010 2127  BILIRUBINUR NEGATIVE 07/02/2010 2127   KETONESUR NEGATIVE 07/02/2010 2127   PROTEINUR >300 (A) 07/02/2010 2127   UROBILINOGEN 0.2 07/02/2010 2127   NITRITE NEGATIVE 07/02/2010 2127   LEUKOCYTESUR LARGE (A) 07/02/2010 2127    Creatinine Clearance: CrCl cannot be calculated (Unknown ideal weight.).  Sepsis Labs: @LABRCNTIP (procalcitonin:4,lacticidven:4) )No results found for this or any previous visit (from the past 240 hour(s)).   Radiological Exams on Admission: Dg Chest Port 1 View  Result Date: 10/13/2016 CLINICAL DATA:  Shortness of breath. EXAM: PORTABLE CHEST 1 VIEW COMPARISON:  02/03/2016 FINDINGS: The cardio pericardial silhouette is enlarged. Pulmonary vascular congestion noted with diffuse interstitial prominence suggesting edema. The visualized bony structures of the thorax are intact. Telemetry leads overlie the chest. IMPRESSION: Cardiomegaly with vascular congestion and probable interstitial edema. Electronically Signed   By: Misty Stanley M.D.   On: 10/13/2016 12:13    EKG: Independently reviewed. 1st degree AV block. No ACS. No change from previous.  Assessment/Plan Active Problems:   ESRD needing dialysis ([place])   Diabetes mellitus with complication (HCC)   Acute on chronic combined systolic and diastolic CHF (congestive heart failure) (HCC)   Anemia due to chronic kidney disease   Acute respiratory failure (HCC)   Acute hypoxic respiratory failure: likely from acute on chronic diastolic CHF in the setting of ESRD w/ fluid overload. Last echo showing an EF of 60% and moderate LVH, with severe mitral valve calcification with mild aortic stenosis. Patient is compliant with her dialysis regimen. - dialysis per Nephrology - Daily wts, I/O  Anemia: 4.7 on presentation ot ED. No h/o GI loss. hemoccult negative in the ED. Unsure if this lab error or from true progression of her anemia  of chronic disease likely secondary to renal failure. - Repeat CBC - Transfuse 2 units PRBC if anemia confirmed  ESRD: T/TH/Sat. Fistula patent in LUE - continue renal vitamins - Dialysis per renal  DM: - continue lantus - SSI  HLD: - continue lipitorCBC  Hypotension: - continue midodrine  DVT prophylaxis: SCD  Code Status: full  Family Communication: daugther  Disposition Plan: pending improvement after dialysis  Consults called: nephrology  Admission status: inpt    Waldemar Dickens MD [facility]  If 7PM-7AM, please contact night-coverage [email or web address] Password [email or web address]  10/13/2016, 1:57 PM

## 2016-10-13 NOTE — ED Provider Notes (Signed)
[facility] DEPT Provider Note   CSN: [id number] Arrival date & time: 10/13/16  1039     History   Chief Complaint Chief Complaint  Patient presents with  . Shortness of Breath    HPI [name] is a 70 y.o. female.  Patient is a 71 year old female with a history of end-stage renal disease on dialysis Tuesday, Thursday and Saturday, coronary artery disease, CHF, diabetes presenting today with shortness of breath. Patient states that the shortness of breath just started this morning when she woke up however when she went to church yesterday she was feeling fatigued and weak after church but denies any shortness breath or chest pain yesterday. Her last dialysis was on Saturday. At that time they took a little more fluid than normal. She states she normally is a 125 kg and they will took her to 124. She denies any recent medication changes. She has not noticed any new swelling in her legs and denies any abdominal pain or vomiting. Shortness of breath seems to be made worse with exertion but she is not sure it's worse with lying down. She denies any cough and has not tried her inhalers today. She denies fever or cold-like symptoms. She's not noticed any dark stools or excessive bleeding. No prior history of blood clots.   The history is provided by the patient.  Shortness of Breath  This is a new problem. The average episode lasts 6 hours. The problem occurs continuously.The current episode started 6 to 12 hours ago. The problem has not changed since onset.Pertinent negatives include no fever, no headaches, no cough, no sputum production, no hemoptysis, no wheezing, no chest pain, no vomiting, no abdominal pain, no leg pain and no leg swelling. Associated symptoms comments: Feels lightheaded with standing.. She has had prior hospitalizations. She has had prior ED visits. Associated medical issues include CAD and heart failure.    Past Medical History:  Diagnosis Date  . Abdominal  abscess 12-17-10   abdominal abscesses x2 ? one at this time  . Anemia   . Arthritis    "all over" (08/23/2012)  . Blind right eye    fell and crushed socket and eye fell out, was replaced at [facility]  . CHF (congestive heart failure) ([place])   . Chronic lower back pain   . Coronary artery disease    Dr Kadakia-cardiologist 2-3 x year  . Dyspnea   . Dysrhythmia    irregular, "skips beats"  . ESRD (end stage renal disease) on dialysis ([place])    "started 12/2011; [facility]; TTS" (08/23/2012  . Exertional shortness of breath   . Eye drainage    "lots; since fall 07/2011" (08/23/2012)  . Gout   . H/O hiatal hernia   . Headache(784.0)    "weekly sometimes; since I fell and hit my head in 07/2011 08/23/2012)  . Heart murmur   . History of blood transfusion    "lots before starting dialysis" (08/23/2012)  . Hypertension    sees Dr. Willey Blade  . Inhalation injury    "worked at [employer]; can't inhale polyurethane or paint, etc" (08/23/2012)  . Myocardial infarction ([place]) 1970's or 1980's  . Neuromuscular disorder ([place])    diabetic neuropathy  . OSA on CPAP   . Staph aureus infection November 2012   . Swelling of both ankles 12-17-10  . Type II diabetes mellitus ([place])    "over 10 years now" (08/23/2012)    Patient Active Problem List   Diagnosis Date  Noted  . Acute left systolic heart failure ([place]) 01/30/2016  . Encephalopathy, metabolic   . Tremor   . Weakness   . Acute hyperkalemia 12/04/2014  . Neurological deficit present 12/04/2014  . Hypercalcemia 12/04/2014  . OSA on CPAP   . Myocardial infarction ([place])   . Blind right eye   . Coronary artery disease   . CHF (congestive heart failure) ([place])   . ESRD needing dialysis ([place])   . Hyperkalemia 08/05/2014  . Type 2 diabetes mellitus ([place]) 01/18/2014  . Arm pain 02/27/2012  . Other complications due to renal dialysis device, implant, and graft 02/13/2012  . Pain in limb 11/21/2011  . Swollen limb 11/21/2011  . Itching  11/21/2011  . End stage renal disease ([place]) 11/07/2011  . Chronic kidney disease, stage IV (severe) ([place]) 10/24/2011  . Chronic kidney disease (CKD), stage IV (severe) ([place]) 08/29/2011    Past Surgical History:  Procedure Laterality Date  . APPENDECTOMY    . AV FISTULA PLACEMENT  09/30/2011   Procedure: ARTERIOVENOUS (AV) FISTULA CREATION;  Surgeon: Conrad Southgate, MD;  Location: [facility];  Service: Vascular;  Laterality: Left;  BRACHIAL-CEPHALIC  . BACK SURGERY    . BASCILIC VEIN TRANSPOSITION Left 11/10/2011   Left arm   . [place] TRANSPOSITION  01/28/2012   Procedure: BASCILIC VEIN TRANSPOSITION;  Surgeon: Conrad [place], MD;  Location: [facility];  Service: Vascular;  Laterality: Left;  Second Stage   . CARDIAC CATHETERIZATION  1980's  . CATARACT EXTRACTION W/ INTRAOCULAR LENS IMPLANT Bilateral 1990's  . DILATION AND CURETTAGE OF UTERUS  1960's  . EYE SURGERY    . HERNIA REPAIR    . INSERTION OF DIALYSIS CATHETER  01/05/2012   Procedure: INSERTION OF DIALYSIS CATHETER;  Surgeon: Conrad Edgemere, MD;  Location: [facility];  Service: Vascular;  Laterality: N/A;  Right Internal Jugular Placement  . INTRAOCULAR PROSTHESES INSERTION Right 2013   "fell; knocked my eye outl" (08/23/2012)  . JOINT REPLACEMENT    . LAPAROTOMY  12/18/2010   Procedure: EXPLORATORY LAPAROTOMY;  Surgeon: Rolm Bookbinder, MD;  Location[facility];  Service: General;  Laterality: N/A;  Abdominal Seroma Evacuation  . LEFT HEART CATHETERIZATION WITH CORONARY ANGIOGRAM N/A 08/25/2012   Procedure: LEFT HEART CATHETERIZATION WITH CORONARY ANGIOGRAM;  Surgeon: Birdie Riddle, MD;  Location: [facility];  Service: Cardiovascular;  Laterality: N/A;  . LUMBAR DISC SURGERY  1970'-80's   X 3  . TOTAL KNEE ARTHROPLASTY Left 1980's  . VAGINAL HYSTERECTOMY  1970's  . VENTRAL HERNIA REPAIR  2012-2013   component separation, repair with biologic; "had 3 surgeries to fix it" (08/23/2012)  . WOUND DEBRIDEMENT  03/20/2011   Procedure: DEBRIDEMENT  ABDOMINAL WOUND;  Surgeon: Rolm Bookbinder, MD;  Location: [facility];  Service: General;  Laterality: N/A;  debridement abdominal wall, placement of wound vac    OB History    No data available       Home Medications    Prior to Admission medications   Medication Sig Start Date End Date Taking? Authorizing Provider  albuterol (PROVENTIL HFA;VENTOLIN HFA) 108 (90 BASE) MCG/ACT inhaler Inhale 2 puffs into the lungs every 4 (four) hours as needed for wheezing or shortness of breath.    [provider]  albuterol (PROVENTIL) (2.5 MG/3ML) 0.083% nebulizer solution Take 3 mLs (2.5 mg total) by nebulization every 6 (six) hours as needed for wheezing or shortness of breath. 02/12/16   Dixie Dials, MD  aspirin EC 81 MG  tablet Take 81 mg by mouth daily.      [provider]  atorvastatin (LIPITOR) 40 MG tablet Take 40 mg by mouth daily.    Dixie Dials, MD  bisacodyl (DULCOLAX) 5 MG EC tablet Take 10 mg by mouth daily as needed for moderate constipation.     [provider]  Calcium Carbonate-Vitamin D (CALCIUM 600 + D PO) Take 1 tablet by mouth daily.     [provider]  diphenhydrAMINE (BENADRYL) 25 mg capsule Take 50 mg by mouth every 6 (six) hours as needed.    [provider]  docusate sodium (COLACE) 100 MG capsule Take 100 mg by mouth daily.    [provider]  ethyl chloride spray Apply 1 application topically daily as needed. For HD 01/10/16   [provider]  insulin glargine (LANTUS) 100 UNIT/ML injection Inject 5-10 Units into the skin at bedtime. Patient uses sliding scale.    [provider]  midodrine (PROAMATINE) 5 MG tablet Take 1 tablet (5 mg total) by mouth 3 (three) times daily with meals. 02/12/16   Dixie Dials, MD  Multiple Vitamin (MULITIVITAMIN WITH MINERALS) TABS Take 1 tablet by mouth daily.    [provider]  nitroGLYCERIN (NITROSTAT) 0.4 MG SL tablet Place 0.4 mg under the tongue every 5  (five) minutes as needed for chest pain.    [provider]  SENSIPAR 90 MG tablet Take 90 mg by mouth daily. 11/30/14   [provider]  sevelamer carbonate (RENVELA) 800 MG tablet Take 1 tablet (800 mg total) by mouth 3 (three) times daily with meals. 02/12/16   Dixie Dials, MD    Family History Family History  Problem Relation Age of Onset  . Hypertension Mother   . Cancer Brother        spine  . Hypertension Father   . Cancer Sister        brain  . Hypertension Maternal Grandmother   . Anesthesia problems Neg Hx     Social History Social History  Substance Use Topics  . Smoking status: Former Smoker    Packs/day: 0.25    Years: 16.00    Types: Cigarettes    Quit date: 12/16/1980  . Smokeless tobacco: Never Used  . Alcohol use No     Allergies   Patient has no known allergies.   Review of Systems Review of Systems  Constitutional: Negative for fever.  Respiratory: Positive for shortness of breath. Negative for cough, hemoptysis, sputum production and wheezing.   Cardiovascular: Negative for chest pain and leg swelling.  Gastrointestinal: Negative for abdominal pain and vomiting.  Neurological: Negative for headaches.  All other systems reviewed and are negative.    Physical Exam Updated Vital Signs BP (!) 91/45 (BP Location: Right Arm)   Pulse 69   Temp 97.6 F (36.4 C) (Oral)   Resp (!) 22   SpO2 100%   Physical Exam  Constitutional: She is oriented to person, place, and time. She appears well-developed and well-nourished. No distress.  Obese  HENT:  Head: Normocephalic and atraumatic.  Mouth/Throat: Oropharynx is clear and moist.  Eyes: Pupils are equal, round, and reactive to light. EOM are normal.  Pale conjunctiva  Neck: Normal range of motion. Neck supple.  Cardiovascular: Normal rate, regular rhythm and intact distal pulses.   No murmur heard. Pulmonary/Chest: Effort normal. Tachypnea noted. No respiratory distress. She  has no wheezes. She has rales in the right lower field and the left  lower field.  Abdominal: Soft. She exhibits no distension. There is no tenderness. There is no rebound and no guarding.  Musculoskeletal: Normal range of motion. She exhibits no edema or tenderness.  Dialysis graft present in the LUE  Neurological: She is alert and oriented to person, place, and time.  Skin: Skin is warm and dry. No rash noted. No erythema.  Psychiatric: She has a normal mood and affect. Her behavior is normal.  Nursing note and vitals reviewed.    ED Treatments / Results  Labs (all labs ordered are listed, but only abnormal results are displayed) Labs Reviewed  CBC - Abnormal; Notable for the following:       Result Value   WBC 15.3 (*)    RBC 1.64 (*)    Hemoglobin 4.7 (*)    HCT 15.0 (*)    RDW 16.1 (*)    All other components within normal limits  D-DIMER, QUANTITATIVE (NOT AT [facility]) - Abnormal; Notable for the following:    D-Dimer, Quant 2.38 (*)    All other components within normal limits  BASIC METABOLIC PANEL - Abnormal; Notable for the following:    Sodium 134 (*)    Chloride 90 (*)    Glucose, Bld 164 (*)    BUN 39 (*)    Creatinine, Ser 9.51 (*)    GFR calc non Af Amer 4 (*)    GFR calc Af Amer 4 (*)    All other components within normal limits  I-STAT TROPONIN, ED - Abnormal; Notable for the following:    Troponin i, poc 0.11 (*)    All other components within normal limits  BRAIN NATRIURETIC PEPTIDE  POC OCCULT BLOOD, ED  TYPE AND SCREEN  PREPARE RBC (CROSSMATCH)    EKG  EKG Interpretation  Date/Time:  Monday October 13 2016 10:38:48 EDT Ventricular Rate:  69 PR Interval:  280 QRS Duration: 86 QT Interval:  416 QTC Calculation: 445 R Axis:   -38 Text Interpretation:  Sinus rhythm with 1st degree A-V block with Blocked Premature atrial complexes Left axis deviation No significant change since last tracing Confirmed by Blanchie Dessert ([id number]) on 10/13/2016 11:47:25  AM       Radiology Dg Chest Port 1 View  Result Date: 10/13/2016 CLINICAL DATA:  Shortness of breath. EXAM: PORTABLE CHEST 1 VIEW COMPARISON:  02/03/2016 FINDINGS: The cardio pericardial silhouette is enlarged. Pulmonary vascular congestion noted with diffuse interstitial prominence suggesting edema. The visualized bony structures of the thorax are intact. Telemetry leads overlie the chest. IMPRESSION: Cardiomegaly with vascular congestion and probable interstitial edema. Electronically Signed   By: Misty Stanley M.D.   On: 10/13/2016 12:13    Procedures Procedures (including critical care time)  Medications Ordered in ED Medications - No data to display   Initial Impression / Assessment and Plan / ED Course  I have reviewed the triage vital signs and the nursing notes.  Pertinent labs & imaging results that were available during my care of the patient were reviewed by me and considered in my medical decision making (see chart for details).     Patient presenting today with multiple medical problems and new onset of shortness of breath. Patient is visibly tachypnea on exam with oxygen saturation as low as 86% with movement which improves with sitting up and rest. Patient placed on 2 L of oxygen and saturation now 100%. Patient denies any evidence of acute bleeding. She denies chest pain or abdominal pain. She has no pain or  numbness in her arms or legs. Low suspicion for dissection or AAA rupture at this time. Concern for possible anemia versus fluid overload versus MI versus PE. There is no wheezing at this time or evidence to suggest COPD or asthma. Low suspicion for pneumonia as patient has no infectious symptoms. Patient's EKG today does not show signs of STEMI. However troponin is elevated minimally at 0.11. Chest x-ray, CBC, CMP, BNP, d-dimer pending.  12:40 PM Patient found to be fluid overloaded by x-ray with cardiomegaly and pulmonary edema as well as new severe anemia with a  hemoglobin of 4.7 from her baseline of 8. Rectal exam done without dark stool. Hemoccult is pending. Patient's d-dimer is elevated 2.3 however feel that there are other explanations for her shortness of breath and do not need to further investigate for PE at this time. Will discuss with internal medicine as well as nephrology as patient will most likely need dialysis.  Transfusion ordered and patient consented.  BMP without acute finding.  Fecal occult neg.  CRITICAL CARE Performed by: Blanchie Dessert Total critical care time: 30 minutes Critical care time was exclusive of separately billable procedures and treating other patients. Critical care was necessary to treat or prevent imminent or life-threatening deterioration. Critical care was time spent personally by me on the following activities: development of treatment plan with patient and/or surrogate as well as nursing, discussions with consultants, evaluation of patient's response to treatment, examination of patient, obtaining history from patient or surrogate, ordering and performing treatments and interventions, ordering and review of laboratory studies, ordering and review of radiographic studies, pulse oximetry and re-evaluation of patient's condition.   Final Clinical Impressions(s) / ED Diagnoses   Final diagnoses:  Anemia, unspecified type  Acute pulmonary edema ([place])  ESRD (end stage renal disease) ([place])    New Prescriptions New Prescriptions   No medications on file     Blanchie Dessert, MD 10/13/16 1300

## 2016-10-13 NOTE — ED Notes (Signed)
Meds requested from pharmacy.

## 2016-10-13 NOTE — ED Triage Notes (Signed)
Pt to ER for evaluation of shortness of breath onset this morning but states felt weak and fatigued yesterday after church. On dialysis, last tx Saturday, states "they took off more fluid than usual." pt tachypneic in triage. A/o x4. VSS.

## 2016-10-13 NOTE — ED Notes (Signed)
Pt brought back to patient room, placed on monitor, spO2 86% on RA. In triage patient sats 100%. Placed on 3L nasal cannula.

## 2016-10-14 DIAGNOSIS — D649 Anemia, unspecified: Secondary | ICD-10-CM

## 2016-10-14 LAB — GLUCOSE, CAPILLARY
Glucose-Capillary: 129 mg/dL — ABNORMAL HIGH (ref 65–99)
Glucose-Capillary: 127 mg/dL — ABNORMAL HIGH (ref 65–99)
Glucose-Capillary: 123 mg/dL — ABNORMAL HIGH (ref 65–99)
Glucose-Capillary: 228 mg/dL — ABNORMAL HIGH (ref 65–99)

## 2016-10-14 LAB — COMPREHENSIVE METABOLIC PANEL
ALK PHOS: 36 U/L — AB (ref 38–126)
ALT: 8 U/L — AB (ref 14–54)
AST: 12 U/L — ABNORMAL LOW (ref 15–41)
Albumin: 2.7 g/dL — ABNORMAL LOW (ref 3.5–5.0)
Anion gap: 14 (ref 5–15)
BUN: 55 mg/dL — AB (ref 6–20)
CO2: 29 mmol/L (ref 22–32)
Calcium: 9.2 mg/dL (ref 8.9–10.3)
Chloride: 92 mmol/L — ABNORMAL LOW (ref 101–111)
Creatinine, Ser: 11.07 mg/dL — ABNORMAL HIGH (ref 0.44–1.00)
GFR, EST AFRICAN AMERICAN: 3 mL/min — AB (ref >60)
GFR, EST NON AFRICAN AMERICAN: 3 mL/min — AB (ref >60)
GLUCOSE: 149 mg/dL — AB (ref 65–99)
Potassium: 4.5 mmol/L (ref 3.5–5.1)
SODIUM: 135 mmol/L (ref 135–145)
Total Bilirubin: 0.6 mg/dL (ref 0.3–1.2)
Total Protein: 6.8 g/dL (ref 6.5–8.1)

## 2016-10-14 LAB — CBC
HEMATOCRIT: 27.9 % — AB (ref 36.0–46.0)
HEMOGLOBIN: 8.6 g/dL — AB (ref 12.0–15.0)
MCH: 28 pg (ref 26.0–34.0)
MCHC: 30.8 g/dL (ref 30.0–36.0)
MCV: 90.9 fL (ref 78.0–100.0)
Platelets: 174 10*3/uL (ref 150–400)
RBC: 3.07 MIL/uL — AB (ref 3.87–5.11)
RDW: 16.2 % — ABNORMAL HIGH (ref 11.5–15.5)
WBC: 8.6 10*3/uL (ref 4.0–10.5)

## 2016-10-14 LAB — MRSA PCR SCREENING: MRSA BY PCR: NEGATIVE

## 2016-10-14 MED ORDER — PRO-STAT SUGAR FREE PO LIQD: 30.0000 mL | Freq: Three times a day (TID) | ORAL | Status: DC

## 2016-10-14 MED ORDER — NEPRO/CARBSTEADY PO LIQD
237.0000 mL | Freq: Three times a day (TID) | ORAL | Status: DC
  Administered 2016-10-14 – 2016-10-18 (×8): 237 mL via ORAL
  Filled 2016-10-14 (×22): qty 237

## 2016-10-14 MED ORDER — RENA-VITE PO TABS
1.0000 | ORAL_TABLET | Freq: Every day | ORAL | Status: DC
  Administered 2016-10-14 – 2016-10-21 (×8): 1 via ORAL
  Filled 2016-10-14 (×9): qty 1

## 2016-10-14 MED ORDER — LIDOCAINE-PRILOCAINE 2.5-2.5 % EX CREA: 1.0000 "application " | TOPICAL_CREAM | CUTANEOUS | Status: DC | PRN

## 2016-10-14 MED ORDER — MIDODRINE HCL 5 MG PO TABS
ORAL_TABLET | ORAL | Status: AC
  Filled 2016-10-14: qty 1

## 2016-10-14 MED ORDER — NEPRO/CARBSTEADY PO LIQD
237.0000 mL | Freq: Three times a day (TID) | ORAL | Status: DC
  Filled 2016-10-14 (×5): qty 237

## 2016-10-14 MED ORDER — SODIUM CHLORIDE 0.9 % IV SOLN: 100.0000 mL | INTRAVENOUS | Status: DC | PRN

## 2016-10-14 MED ORDER — ALTEPLASE 2 MG IJ SOLR: 2.0000 mg | Freq: Once | INTRAMUSCULAR | Status: DC | PRN

## 2016-10-14 MED ORDER — LIDOCAINE HCL (PF) 1 % IJ SOLN: 5.0000 mL | INTRAMUSCULAR | Status: DC | PRN

## 2016-10-14 MED ORDER — CALCITRIOL 0.5 MCG PO CAPS
2.7500 ug | ORAL_CAPSULE | ORAL | Status: DC
  Administered 2016-10-14 – 2016-10-21 (×4): 2.75 ug via ORAL
  Filled 2016-10-14 (×4): qty 1

## 2016-10-14 MED ORDER — PENTAFLUOROPROP-TETRAFLUOROETH EX AERO: 1.0000 "application " | INHALATION_SPRAY | CUTANEOUS | Status: DC | PRN

## 2016-10-14 MED ORDER — HEPARIN SODIUM (PORCINE) 1000 UNIT/ML DIALYSIS: 1000.0000 [IU] | INTRAMUSCULAR | Status: DC | PRN

## 2016-10-14 MED ORDER — PRO-STAT SUGAR FREE PO LIQD
30.0000 mL | Freq: Two times a day (BID) | ORAL | Status: DC
  Administered 2016-10-14 – 2016-10-20 (×10): 30 mL via ORAL
  Filled 2016-10-14 (×11): qty 30

## 2016-10-14 NOTE — Progress Notes (Signed)
[facility] PROGRESS NOTE  [name] WFU:[id number] DOB: May 05, 1945 DOA: 10/13/2016 PCP: [name], MD  Assessment/Plan:  Acute hypoxic respiratory failure: likely from acute on chronic diastolic CHF in the setting of ESRD w/ fluid overload. Last echo showing an EF of 60% and moderate LVH, with severe mitral valve calcification with mild aortic stenosis. Patient is compliant with her dialysis regimen. - dialysis per Nephrology - Daily wts, I/O  Anemia: 4.7 on presentation ot ED. No h/o GI loss. hemoccult negative in the ED. Unsure if this lab error or from true progression of her anemia of chronic disease likely secondary to renal failure. - Transfuse 2 units PRBC and H/H better  ESRD: T/TH/Sat. Fistula patent in LUE - continue renal vitamins - Dialysis per renal  DM: - continue lantus - SSI  HLD: - continue lipitor  Hypotension: - continue midodrine  DVT prophylaxis: SCD  Code Status: full  Family Communication: daugther  Disposition Plan: pending improvement after dialysis  Consults called: nephrology  Admission status: inpt    Consultants:  nephro  Procedures:  none  Antibiotics:  n/a (indicate start date, and stop date if known)  HPI/Subjective: No issues overnight.   Objective: Vitals:   10/14/16 1500 10/14/16 1600  BP: (!) 102/39 (!) 99/46  Pulse: (!) 59 62  Resp:    Temp:    SpO2:      Intake/Output Summary (Last 24 hours) at 10/14/16 1627 Last data filed at 10/14/16 1243  Gross per 24 hour  Intake              360 ml  Output                0 ml  Net              360 ml   Filed Weights   10/14/16 0300  Weight: 131.2 kg (289 lb 3.9 oz)    Exam:   General:  NCAT, NAD  Cardiovascular: RRR, No MRG  Respiratory: CTAB, RRR, nl WOB  Abdomen: ND, NTTP  Data Reviewed: Basic Metabolic Panel:  Recent Labs Lab 10/13/16 1218 10/14/16 0326  NA 134* 135  K 4.4 4.5  CL 90* 92*  CO2 31 29  GLUCOSE 164* 149*   BUN 39* 55*  CREATININE 9.51* 11.07*  CALCIUM 9.3 9.2  MG 2.4  --   PHOS 3.9  --    Liver Function Tests:  Recent Labs Lab 10/14/16 0326  AST 12*  ALT 8*  ALKPHOS 36*  BILITOT 0.6  PROT 6.8  ALBUMIN 2.7*   No results for input(s): LIPASE, AMYLASE in the last 168 hours. No results for input(s): AMMONIA in the last 168 hours. CBC:  Recent Labs Lab 10/13/16 1112 10/13/16 1218 10/14/16 0326  WBC 15.3* 10.1 8.6  NEUTROABS  --  6.7  --   HGB 4.7* 9.4* 8.6*  HCT 15.0* 30.2* 27.9*  MCV 91.5 91.2 90.9  PLT 259 160 174   Cardiac Enzymes: No results for input(s): CKTOTAL, CKMB, CKMBINDEX, TROPONINI in the last 168 hours. BNP (last 3 results)  Recent Labs  01/30/16 1848 02/08/16 0602 10/13/16 1146  BNP 1,568.3* 119.1* 533.8*    ProBNP (last 3 results) No results for input(s): PROBNP in the last 8760 hours.  CBG:  Recent Labs Lab 10/13/16 1801 10/13/16 2219 10/14/16 0812 10/14/16 1233  GLUCAP 122* 195* 129* 127*    Recent Results (from the past 240 hour(s))  MRSA PCR Screening     Status:  None   Collection Time: 10/14/16 12:02 AM  Result Value Ref Range Status   MRSA by PCR NEGATIVE NEGATIVE Final    Comment:        The GeneXpert MRSA Assay (FDA approved for NASAL specimens only), is one component of a comprehensive MRSA colonization surveillance program. It is not intended to diagnose MRSA infection nor to guide or monitor treatment for MRSA infections.      Studies: Dg Chest Port 1 View  Result Date: 10/13/2016 CLINICAL DATA:  Shortness of breath. EXAM: PORTABLE CHEST 1 VIEW COMPARISON:  02/03/2016 FINDINGS: The cardio pericardial silhouette is enlarged. Pulmonary vascular congestion noted with diffuse interstitial prominence suggesting edema. The visualized bony structures of the thorax are intact. Telemetry leads overlie the chest. IMPRESSION: Cardiomegaly with vascular congestion and probable interstitial edema. Electronically Signed   By:  Misty Stanley M.D.   On: 10/13/2016 12:13    Scheduled Meds: . aspirin EC  81 mg Oral Daily  . atorvastatin  40 mg Oral Daily  . calcitRIOL  2.75 mcg Oral Q T,Th,Sat-1800  . cinacalcet  90 mg Oral Q breakfast  . docusate sodium  100 mg Oral Daily  . feeding supplement (NEPRO CARB STEADY)  237 mL Oral TID BM  . feeding supplement (PRO-STAT SUGAR FREE 64)  30 mL Oral BID  . insulin aspart  0-5 Units Subcutaneous QHS  . insulin aspart  0-9 Units Subcutaneous TID WC  . insulin glargine  5 Units Subcutaneous QHS  . midodrine  5 mg Oral TID WC  . multivitamin  1 tablet Oral QHS  . sevelamer carbonate  800 mg Oral TID WC   Continuous Infusions: . sodium chloride    . sodium chloride    . sodium chloride      Active Problems:   ESRD needing dialysis ([place])   Diabetes mellitus with complication (H[facility])   Acute on chronic combined systolic and diastolic CHF (congestive heart failure) ([place])   Anemia due to chronic kidney disease   Acute respiratory failure ([place])    Time spent: [place]. If 7PM-7AM, please contact night-coverage at [email or web address], password [facility] 10/14/2016, 4:27 PM  LOS: 1 day

## 2016-10-14 NOTE — Care Management Note (Addendum)
Case Management Note  Patient Details  Name: [name] MRN: [id number] Date of Birth: 11/17/1945  Subjective/Objective:      CM following for progression and d/c planning. Noted CM referral for CHF program.               Action/Plan: 10/14/2016 record reviewed and noted that pt has been active with [facility] for CHF program and has a [name] aide at this time. Will ask for orders to continue these services.  Pt has scales, a walker, cane, no other DME needs at this time, will follow for possible oxygen needs.  Per pt she does not have [name] or HHPT services at this time.  It does appear that she has [name] following at home.  Pt states that her [facility] aide picks up her medications. Pt has medicare and medicaid to assist with cost of meds.  She states that her daughter fills her pill box weekly .  Pt has transportation to MD appointments.  1pm Spoke with [facility] who provided [facility] services for this pt in the past and will provide HH/CHF program upon d/c , will need orders.   Expected Discharge Date:                  Expected Discharge Plan:  [name]  In-House Referral:  NA  Discharge planning Services  CM Consult  Post Acute Care Choice:  Durable Medical Equipment, Home Health Choice offered to:  Patient  DME Arranged:    DME Agency:     HH Arranged:  RN, PT, OT, Nurse's Aide [name] Agency:     Status of Service:  In process, will continue to follow  If discussed at Long Length of Stay Meetings, dates discussed:    Additional Comments:  Tiffany Bene, RN 10/14/2016, 11:03 AM

## 2016-10-14 NOTE — Progress Notes (Signed)
Initial Nutrition Assessment  DOCUMENTATION CODES:   Morbid obesity  INTERVENTION:   - Prostat BID. Each supplement provides 100 kcal and 15 grams of protein.   - Nepro TID. Each supplement provides 425 kcals and 19 grams of protein.   -Recommend fluid restriction (1200 mL)   NUTRITION DIAGNOSIS:   Increased nutrient needs related to chronic illness (ESRD, DM, HTN, anemia, CAD, SOB) as evidenced by estimated needs.   GOAL:   Patient will meet greater than or equal to 90% of their needs  MONITOR:   PO intake, Supplement acceptance, Weight trends, I & O's, Labs  REASON FOR ASSESSMENT:   Malnutrition Screening Tool    ASSESSMENT:   SOB d/t acute on chronic CHF, fluid overload in setting of ESRD HD.  PMH of HTN, COPD, anemia, and CAD.  Pt reports poor appetite and PO intake at home. Pt eats once a day, foods such as potatoes, grits, or cheetos. Pt usually takes a sandwich to dialysis. Pt reports not eating very much, especially after being dialyzed. Consumes Pro-stat at diaysis.   Pt reports low fluid consumption, however she consumes about 3 cups of ice per day (possible pica). Pt seemed very SOB during visit. RD discussed fluid containing foods and sodium containing foods. Provided pt with "Fluid Restricted Nutrition Therapy" and "Low Sodium Nutrition Therapy" handouts from the Academy of Nutrition and Dietetics website.   Pt reports a 10 lb weight loss over the past two weeks. If poor PO intake continues, pt will be at risk for malnutrition. Pt currently above estimated dry weight and has high IDWG. Pt is anuric. Pt plans to go to dialysis today. Net UF goal of 4 L as tolerated.  Medications: Colace, Novolog 0-5 units, Lantus 5 units, Renvela, Sensipar  Labs: CBG 129 (H) K and Phos WDL  Nutrition Focused Physical Exam Findings: Mild fat loss of orbital region, mild muscle wasting of temple. Edema in ankles.   Diet Order:  Diet regular Room service appropriate? Yes;  Fluid consistency: Thin  Skin:  Reviewed, no issues  Last BM:  10/13/16  Height:   Ht Readings from Last 1 Encounters:  12/05/14 5\' 5"  (1.651 m)    Weight:   Wt Readings from Last 1 Encounters:  10/14/16 289 lb 3.9 oz (131.2 kg)    Ideal Body Weight:  57 kg  BMI:  Body mass index is 48.13 kg/m.  Estimated Nutritional Needs:   Kcal:  2000-2200 kcals  Protein:  110-125 grams of protein  Fluid:  1000 mL + fluid output  EDUCATION NEEDS:   Education needs addressed  [place] Dietetic Intern Pager: [phone] 10/14/2016 2:48 PM

## 2016-10-14 NOTE — Procedures (Signed)
I was present at this dialysis session. I have reviewed the session itself and made appropriate changes.   Filed Weights   10/14/16 0300  Weight: 131.2 kg (289 lb 3.9 oz)     Recent Labs Lab 10/13/16 1218 10/14/16 0326  NA 134* 135  K 4.4 4.5  CL 90* 92*  CO2 31 29  GLUCOSE 164* 149*  BUN 39* 55*  CREATININE 9.51* 11.07*  CALCIUM 9.3 9.2  PHOS 3.9  --      Recent Labs Lab 10/13/16 1112 10/13/16 1218 10/14/16 0326  WBC 15.3* 10.1 8.6  NEUTROABS  --  6.7  --   HGB 4.7* 9.4* 8.6*  HCT 15.0* 30.2* 27.9*  MCV 91.5 91.2 90.9  PLT 259 160 174    Scheduled Meds: . aspirin EC  81 mg Oral Daily  . atorvastatin  40 mg Oral Daily  . calcitRIOL  2.75 mcg Oral Q T,Th,Sat-1800  . cinacalcet  90 mg Oral Q breakfast  . docusate sodium  100 mg Oral Daily  . feeding supplement (NEPRO CARB STEADY)  237 mL Oral TID BM  . feeding supplement (PRO-STAT SUGAR FREE 64)  30 mL Oral BID  . insulin aspart  0-5 Units Subcutaneous QHS  . insulin aspart  0-9 Units Subcutaneous TID WC  . insulin glargine  5 Units Subcutaneous QHS  . midodrine  5 mg Oral TID WC  . multivitamin  1 tablet Oral QHS  . sevelamer carbonate  800 mg Oral TID WC   Continuous Infusions: . sodium chloride    . sodium chloride    . sodium chloride     PRN Meds:.sodium chloride, sodium chloride, acetaminophen **OR** acetaminophen, alteplase, bisacodyl, heparin, lidocaine (PF), lidocaine-prilocaine, ondansetron **OR** ondansetron (ZOFRAN) IV, pentafluoroprop-tetrafluoroeth   Donetta Potts,  MD 10/14/2016, 2:30 PM

## 2016-10-14 NOTE — Consult Note (Signed)
[facility] CM Inpatient Consult   10/14/2016  [name] Apr 25, 1945 [id number]   Referral received for post hospital follow up.  Patient was assessed for [place] Management for community services. Patient in the [place]. Patient was previously active with [place] [facility] with [place] [facility] and Pharmacist.   Met with patient at bedside regarding being restarted with [facility] services.  Patient endorses [name], MD as her primary care provider. Patient admitted with Pulmonary Edema with H/O Hemodialysis and still short of breath when speaking with her.  She states she wouldn't mind if [facility] would follow up telephonically.  Active consent form signed and folder with [place] [facility] information given.  Patient denies any medications issues she states she uses [facility].  Of note, [facility] Care Management services does not replace or interfere with any services that are arranged by inpatient case management or social work. For additional questions or referrals please contact:   Natividad Brood, RN BSN [facility] Liaison  [phone] business mobile phone Toll free office [phone]

## 2016-10-14 NOTE — Consult Note (Signed)
[facility] Renal Consultation Note    Indication for Consultation:  Management of ESRD/hemodialysis; anemia, hypertension/volume and secondary hyperparathyroidism  ZOX:[id number]  HPI: [name] is a 71 y.o. female. ESRD 2/2 DM, on HD TTS at [facility] since 12/2011. 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  Patient in compliant with prescribed HD, but has chronically large gains especially over the weekends.  Last dialysis was 9/22, she ran a full treatment without complications, reaching 9.8JX below her EDW.   [name] was seen and examined at bedside.  She presented to ED last night c/o shortness of breath that started yesterday morning.  Her symptoms increase when laying flat and with exertion.  Admits to increased fatigue over the last 2 days and vomiting x2 yesterday.  Denies chest pain, cough, chills, abdominal pain, diarrhea, constipation, edema, and loss of appetite.  Pertinent findings include normal WBC, afebrile and CXR showing vascular congestion and probable interstitial edema.  Initial Hgb found 4.3 in ED, repeat was 9.4>8.6. Patient admitted for further evaluation.    Past Medical History:  Diagnosis Date  . Abdominal abscess 12-17-10   abdominal abscesses x2 ? one at this time  . Anemia   . Arthritis    "all over" (08/23/2012)  . Blind right eye    fell and crushed socket and eye fell out, was replaced at [facility]  . CHF (congestive heart failure) ([place])   . Chronic lower back pain   . Coronary artery disease    Dr Kadakia-cardiologist 2-3 x year  . Dyspnea   . Dysrhythmia    irregular, "skips beats"  . ESRD (end stage renal disease) on dialysis ([place])    "started 12/2011; [place]; TTS" (08/23/2012  . Exertional shortness of breath   . Eye drainage    "lots; since fall 07/2011" (08/23/2012)  . Gout   . H/O hiatal hernia   .  Headache(784.0)    "weekly sometimes; since I fell and hit my head in 07/2011 08/23/2012)  . Heart murmur   . History of blood transfusion    "lots before starting dialysis" (08/23/2012)  . Hypertension    sees Dr. Willey Blade  . Inhalation injury    "worked at [employer]; can't inhale polyurethane or paint, etc" (08/23/2012)  . Myocardial infarction ([place]) 1970's or 1980's  . Neuromuscular disorder ([place])    diabetic neuropathy  . OSA on CPAP   . Staph aureus infection November 2012   . Swelling of both ankles 12-17-10  . Type II diabetes mellitus ([place])    "over 10 years now" (08/23/2012)   Past Surgical History:  Procedure Laterality Date  . APPENDECTOMY    . AV FISTULA PLACEMENT  09/30/2011   Procedure: ARTERIOVENOUS (AV) FISTULA CREATION;  Surgeon: Conrad Sonterra, MD;  Location: [place];  Service: Vascular;  Laterality: Left;  BRACHIAL-CEPHALIC  . BACK SURGERY    . BASCILIC VEIN TRANSPOSITION Left 11/10/2011   Left arm   . [place] TRANSPOSITION  01/28/2012   Procedure: BASCILIC VEIN TRANSPOSITION;  Surgeon: Conrad [place], MD;  Location: [place];  Service: Vascular;  Laterality: Left;  Second Stage   . CARDIAC CATHETERIZATION  1980's  . CATARACT EXTRACTION W/ INTRAOCULAR LENS IMPLANT Bilateral 1990's  . DILATION AND CURETTAGE OF UTERUS  1960's  . EYE SURGERY    . HERNIA REPAIR    . INSERTION OF DIALYSIS  CATHETER  01/05/2012   Procedure: INSERTION OF DIALYSIS CATHETER;  Surgeon: Conrad [place], MD;  Location: [place];  Service: Vascular;  Laterality: N/A;  Right Internal Jugular Placement  . INTRAOCULAR PROSTHESES INSERTION Right 2013   "fell; knocked my eye outl" (08/23/2012)  . JOINT REPLACEMENT    . LAPAROTOMY  12/18/2010   Procedure: EXPLORATORY LAPAROTOMY;  Surgeon: Rolm Bookbinder, MD;  Location: [facility];  Service: General;  Laterality: N/A;  Abdominal Seroma Evacuation  . LEFT HEART CATHETERIZATION WITH CORONARY ANGIOGRAM N/A 08/25/2012   Procedure: LEFT HEART CATHETERIZATION WITH  CORONARY ANGIOGRAM;  Surgeon: Birdie Riddle, MD;  Location: [place] [facility];  Service: Cardiovascular;  Laterality: N/A;  . LUMBAR DISC SURGERY  1970'-80's   X 3  . TOTAL KNEE ARTHROPLASTY Left 1980's  . VAGINAL HYSTERECTOMY  1970's  . VENTRAL HERNIA REPAIR  2012-2013   component separation, repair with biologic; "had 3 surgeries to fix it" (08/23/2012)  . WOUND DEBRIDEMENT  03/20/2011   Procedure: DEBRIDEMENT ABDOMINAL WOUND;  Surgeon: Rolm Bookbinder, MD;  Location: [place];  Service: General;  Laterality: N/A;  debridement abdominal wall, placement of wound vac   Family History  Problem Relation Age of Onset  . Hypertension Mother   . Cancer Brother        spine  . Hypertension Father   . Cancer Sister        brain  . Hypertension Maternal Grandmother   . Anesthesia problems Neg Hx    Social History:  reports that she quit smoking about 35 years ago. Her smoking use included Cigarettes. She has a 4.00 pack-year smoking history. She has never used smokeless tobacco. She reports that she does not drink alcohol or use drugs. No Known Allergies Prior to Admission medications   Medication Sig Start Date End Date Taking? Authorizing Provider  albuterol (PROVENTIL HFA;VENTOLIN HFA) 108 (90 BASE) MCG/ACT inhaler Inhale 2 puffs into the lungs every 4 (four) hours as needed for wheezing or shortness of breath.   Yes [provider]  aspirin EC 81 MG tablet Take 81 mg by mouth daily.     Yes [provider]  atorvastatin (LIPITOR) 40 MG tablet Take 40 mg by mouth at bedtime.    Yes Dixie Dials, MD  bisacodyl (DULCOLAX) 5 MG EC tablet Take 10 mg by mouth daily as needed for moderate constipation.    Yes [provider]  Calcium Carbonate-Vitamin D (CALCIUM 600 + D PO) Take 1 tablet by mouth daily.    Yes [provider]  carvedilol (COREG) 25 MG tablet Take 25 mg by mouth See admin instructions. Take 1 tablet (25 mg) by mouth twice daily - 9am & bedtime (1am) on  Monday, Wednesday, Friday, Sunday (non-dialysis days) and 1pm & bedtime (1am) on Tuesday, Thursday, Saturday (dialysis days) 09/26/16  Yes [provider]  cinacalcet (SENSIPAR) 90 MG tablet Take 90 mg by mouth See admin instructions. Take 1 tablet (90 mg) by mouth every morning on Monday, Wednesday, Friday, Sunday and at 1pm on Tuesday, Thursday, Saturday (dialysis days)   Yes [provider]  diphenhydrAMINE (BENADRYL) 25 mg capsule Take 50 mg by mouth See admin instructions. Take 2 tablets (50 mg) by mouth prior to dialysis on Tuesday, Thursday, Saturday - for itching   Yes [provider]  ethyl chloride spray Apply 1 application topically See admin instructions. Apply topically before dialysis on Tuesday, Thursday, Saturday 01/10/16  Yes [provider]  furosemide (LASIX) 40 MG  tablet Take 40 mg by mouth daily. 10/03/16  Yes [provider]  HYDROcodone-acetaminophen (NORCO/VICODIN) 5-325 MG tablet Take 1 tablet by mouth 2 (two) times daily as needed (knee pain).  09/07/16  Yes [provider]  insulin glargine (LANTUS) 100 UNIT/ML injection Inject 5 Units into the skin at bedtime.    Yes [provider]  midodrine (PROAMATINE) 10 MG tablet Take 10 mg by mouth See admin instructions. Take 1 tablet (10 mg) by mouth twice daily - 9am & bedtime (1am) on Monday, Wednesday, Friday, Sunday (non-dialysis days) and 5am & betw 7a and 8a on Tuesday, Thursday, Saturday (dialysis days) 09/26/16  Yes [provider]  Multiple Vitamin (MULITIVITAMIN WITH MINERALS) TABS Take 1 tablet by mouth daily.   Yes [provider]  sevelamer carbonate (RENVELA) 800 MG tablet Take 1 tablet (800 mg total) by mouth 3 (three) times daily with meals. Patient taking differently: Take 1,600-2,400 mg by mouth See admin instructions. Take 3 tablets (2400 mg) by mouth three times daily with meals and 2 tablets (1600 mg) with snacks 02/12/16  Yes Dixie Dials,  MD  albuterol (PROVENTIL) (2.5 MG/3ML) 0.083% nebulizer solution Take 3 mLs (2.5 mg total) by nebulization every 6 (six) hours as needed for wheezing or shortness of breath. Patient not taking: Reported on 10/13/2016 02/12/16   Dixie Dials, MD  midodrine (PROAMATINE) 5 MG tablet Take 1 tablet (5 mg total) by mouth 3 (three) times daily with meals. Patient not taking: Reported on 10/13/2016 02/12/16   Dixie Dials, MD   Current Facility-Administered Medications  Medication Dose Route Frequency Provider Last Rate Last Dose  . 0.9 %  sodium chloride infusion   Intravenous Once Blanchie Dessert, MD      . acetaminophen (TYLENOL) tablet 650 mg  650 mg Oral Q6H PRN Waldemar Dickens, MD       Or  . acetaminophen (TYLENOL) suppository 650 mg  650 mg Rectal Q6H PRN Waldemar Dickens, MD      . aspirin EC tablet 81 mg  81 mg Oral Daily Waldemar Dickens, MD   81 mg at 10/14/16 9622  . atorvastatin (LIPITOR) tablet 40 mg  40 mg Oral Daily Waldemar Dickens, MD   40 mg at 10/14/16 2979  . bisacodyl (DULCOLAX) EC tablet 10 mg  10 mg Oral Daily PRN Waldemar Dickens, MD      . cinacalcet [facility]) tablet 90 mg  90 mg Oral Q breakfast Waldemar Dickens, MD   90 mg at 10/14/16 0901  . docusate sodium (COLACE) capsule 100 mg  100 mg Oral Daily Waldemar Dickens, MD   100 mg at 10/13/16 2330  . insulin aspart (novoLOG) injection 0-5 Units  0-5 Units Subcutaneous QHS Waldemar Dickens, MD      . insulin aspart (novoLOG) injection 0-9 Units  0-9 Units Subcutaneous TID WC Waldemar Dickens, MD   1 Units at 10/14/16 [phone]  . insulin glargine (LANTUS) injection 5 Units  5 Units Subcutaneous QHS Waldemar Dickens, MD   5 Units at 10/13/16 2257  . midodrine (PROAMATINE) tablet 5 mg  5 mg Oral TID WC Waldemar Dickens, MD   5 mg at 10/14/16 0901  . ondansetron (ZOFRAN) tablet 4 mg  4 mg Oral Q6H PRN Waldemar Dickens, MD       Or  . ondansetron [facility]) injection 4 mg  4 mg Intravenous Q6H PRN Waldemar Dickens, MD      .  sevelamer carbonate (RENVELA) tablet 800 mg  800 mg Oral TID WC Waldemar Dickens, MD   800 mg at 10/14/16 0902   Labs: Basic Metabolic Panel:  Recent Labs Lab 10/13/16 1218 10/14/16 0326  NA 134* 135  K 4.4 4.5  CL 90* 92*  CO2 31 29  GLUCOSE 164* 149*  BUN 39* 55*  CREATININE 9.51* 11.07*  CALCIUM 9.3 9.2  PHOS 3.9  --    Liver Function Tests:  Recent Labs Lab 10/14/16 0326  AST 12*  ALT 8*  ALKPHOS 36*  BILITOT 0.6  PROT 6.8  ALBUMIN 2.7*   No results for input(s): LIPASE, AMYLASE in the last 168 hours. No results for input(s): AMMONIA in the last 168 hours. CBC:  Recent Labs Lab 10/13/16 1112 10/13/16 1218 10/14/16 0326  WBC 15.3* 10.1 8.6  NEUTROABS  --  6.7  --   HGB 4.7* 9.4* 8.6*  HCT 15.0* 30.2* 27.9*  MCV 91.5 91.2 90.9  PLT 259 160 174   Cardiac Enzymes: No results for input(s): CKTOTAL, CKMB, CKMBINDEX, TROPONINI in the last 168 hours. CBG:  Recent Labs Lab 10/13/16 1801 10/13/16 2219 10/14/16 0812  GLUCAP 122* 195* 129*   Iron Studies: No results for input(s): IRON, TIBC, TRANSFERRIN, FERRITIN in the last 72 hours. Studies/Results: Dg Chest Port 1 View  Result Date: 10/13/2016 CLINICAL DATA:  Shortness of breath. EXAM: PORTABLE CHEST 1 VIEW COMPARISON:  02/03/2016 FINDINGS: The cardio pericardial silhouette is enlarged. Pulmonary vascular congestion noted with diffuse interstitial prominence suggesting edema. The visualized bony structures of the thorax are intact. Telemetry leads overlie the chest. IMPRESSION: Cardiomegaly with vascular congestion and probable interstitial edema. Electronically Signed   By: Misty Stanley M.D.   On: 10/13/2016 12:13    ROS: As per HPI otherwise negative.   Physical Exam: Vitals:   10/13/16 2000 10/13/16 2030 10/13/16 2238 10/14/16 0300  BP: (!) 99/54 (!) 118/57 (!) 95/53 (!) 115/57  Pulse: 68 67 67 73  Resp: 12 20 20 19   Temp:   97.6 F (36.4 C) (!) 97.5 F (36.4 C)  TempSrc:   Oral Oral  SpO2:  95% 97% 100% 100%  Weight:    131.2 kg (289 lb 3.9 oz)     General: WDWN, NAD, obese Head: NCAT, sclera not icteric, MMM Neck: Supple. No lymphadenopathy Lungs: +crackles in b/l bases. On 3L O2 [place]. Heart: RRR, no murmur, rubs or gallops, distant heart sounds likely due to body habitus   Abdomen: soft, NT, + BS, no guarding  Lower extremities: trace edema bilaterally Neuro: A & O  X 3. Moves all extremities spontaneously. Psych:  Responds to questions appropriately with a normal affect. Dialysis Access: LU AVF +thrill  Dialysis Orders: TTS, 4.5hrs, BFR 450, DFR 800, 2K/2Ca, EDW 124.5kg,  LU AVF Heparin 13000U IV bolus Mircera 172mcg IV q2wks - recently lowered, last 12mcg on 9/22 Calcitriol 2.17mcg PO q HD  Last labs - 9/18: Hgb 8.5, Ca 9.6, P 3.9, K 5.3. 8/23: TSAT 31%, Albumin 3.8 PTH 245   Assessment/Plan: 1. Acute hypoxic respiratory failure likely 2/2 CHF and ESRD -Volume Overloaded  -CXR showing vascular congestion and probable interstitial edema  -Afebrile, WBC 10.1>8.6  -Last ECHO 01/2016 - EF 60-65%  -Remove excess volume with HD. 2.  ESRD - TTS  -Inpatient HD orders written for today.  -Volume overloaded, chronic Large IDWG, has been getting slightly below EDW, challenge today.  3.  Hypotension/volume    -BP variable, soft. On midodrine.  Monitor.  -Almost 5L over EDW, if weight correct.  Titrate off excess fluid. Consider extra HD if necessary.  4.  Anemia  -  -Initial Hgb 4.7, likely lab error, repeat 9.4>8.6. No hx GI loss. Occult blood negative.   -Outpatient Hgb trending down over last 2 months. ESA started and subsequently increased. Last mircera 9/22.   -Follow HGB daily CBC. 5.  Metabolic bone disease   -Ca and P stable, within goal.   -Continue 2.48mcg calcitriol PO qHD, Sensipar 90mg  qd, Renvela 800mg  T3ac TID 6.  Nutrition -  -Renal diet, Nepro TID, Renavite 7. DM on insulin - managed by PCP  Jen Mow, PA-c [facility] 10/14/2016, 9:37 AM   I have seen and examined this patient and agree with plan and assessment in the above note with renal recommendations/intervention highlighted.  Plan for HD today with UF and follow respiratory status.  Continue to stress fluid restriction and challenge edw. [place] John A Zaneta Lightcap,MD 10/14/2016 11:52 AM

## 2016-10-14 NOTE — Plan of Care (Signed)
Problem: Fluid Volume: Goal: Ability to maintain a balanced intake and output will improve Outcome: Progressing Patient verbalizes understanding and compliance with fluid restriction. Patient aware of need for dialysis today.

## 2016-10-15 ENCOUNTER — Inpatient Hospital Stay (HOSPITAL_COMMUNITY): Payer: Medicare HMO

## 2016-10-15 LAB — BASIC METABOLIC PANEL
Anion gap: 12 (ref 5–15)
BUN: 33 mg/dL — ABNORMAL HIGH (ref 6–20)
CO2: 28 mmol/L (ref 22–32)
Calcium: 8.6 mg/dL — ABNORMAL LOW (ref 8.9–10.3)
Chloride: 92 mmol/L — ABNORMAL LOW (ref 101–111)
Creatinine, Ser: 6.58 mg/dL — ABNORMAL HIGH (ref 0.44–1.00)
GFR calc Af Amer: 7 mL/min — ABNORMAL LOW (ref >60)
GFR calc non Af Amer: 6 mL/min — ABNORMAL LOW (ref >60)
Glucose, Bld: 124 mg/dL — ABNORMAL HIGH (ref 65–99)
Potassium: 3.9 mmol/L (ref 3.5–5.1)
Sodium: 132 mmol/L — ABNORMAL LOW (ref 135–145)

## 2016-10-15 LAB — GLUCOSE, CAPILLARY
GLUCOSE-CAPILLARY: 146 mg/dL — AB (ref 65–99)
GLUCOSE-CAPILLARY: 105 mg/dL — AB (ref 65–99)
GLUCOSE-CAPILLARY: 198 mg/dL — AB (ref 65–99)
Glucose-Capillary: 110 mg/dL — ABNORMAL HIGH (ref 65–99)

## 2016-10-15 LAB — ECHOCARDIOGRAM COMPLETE
Height: 65 in
WEIGHTICAEL: 4310.43 [oz_av]

## 2016-10-15 NOTE — Plan of Care (Signed)
Problem: Education: Goal: Knowledge of [facility] Education information/materials will improve Outcome: Progressing Provided information about safety. Call bell within reach and bed alarm on. Attended to calls  Problem: Safety: Goal: Ability to remain free from injury will improve Outcome: Progressing Risk factors being assessed. No falls noted and bed alarm on  Problem: Health Behavior/Discharge Planning: Goal: Ability to manage health-related needs will improve Compliant with medication regimen  Problem: Pain Managment: Goal: General experience of comfort will improve Outcome: Progressing Pain being assessed as needed  Problem: Physical Regulation: Goal: Ability to maintain clinical measurements within normal limits will improve Outcome: Progressing Response for tx being monitored Goal: Will remain free from infection Outcome: Progressing Infection prevention being monitored  Problem: Skin Integrity: Goal: Risk for impaired skin integrity will decrease Outcome: Progressing Skin integrity being monitor every shift

## 2016-10-15 NOTE — Progress Notes (Signed)
[facility] Progress Note   Dialysis Orders: TTS, 4.5hrs, BFR 450, DFR 800, 2K/2Ca,  EDW 124.5kg LU AVF Heparin 13000U IV bolus Mircera 165mcg IV q2wks - recently lowered, last 113mcg on 9/22 Calcitriol 2.20mcg PO q HD  Last labs - 9/18: Hgb 8.5, Ca 9.6, P 3.9, K 5.3. 8/23: TSAT 31%, Albumin 3.8 PTH 245  Assessment/Plan: 1. Acute hypoxic respiratory failure likely 2/2 CHF and ESRD -Volume Overloaded  -Continue to titrate down volume with HD, stress fluid restriction             -Last ECHO 01/2016 - EF 60-65% 2.    ESRD - TTS             -HD yesterday, tolerated well. Net UF 2512mL.  Post weight 122.2 kg, under EDW.              -Ordered placed for inpatient HD tomorrow. Will continue to titrate down volume. Net UF goal 2-3L.  3.  Hypotension/volume               -BP chronically soft. On midodrine TID. Monitor.             -Continue to titrate off excess fluid. Will need to determine new EDW at d/c.  4.  Anemia  -             -Hgb trending down 9.4>8.6. Monitor CBC daily.  If continues to fall will need to transfuse with HD.             -Outpatient Hgb trending down over last 2 months. ESA started and subsequently increased. Last mircera 4/94.  5.    Metabolic bone disease              -Ca 8.6 and P stable, within goal.              -Continue 2.36mcg calcitriol PO qHD, Sensipar 90mg  qd, Renvela 800mg  3ac TID 6.  Nutrition -             -Renal diet, Nepro TID, Renavite qd 7. DM on insulin - managed by PCP  Jen Mow, PA-C [place] 10/15/2016,9:50 AM  LOS: 2 days   Subjective:   Patient states she is feeling better today, SOB improved but has not resolved. Denies chest pain, abdominal pain, N/V/D, edema, headache, and fever.  Objective Vitals:   10/14/16 1833 10/14/16 2100 10/15/16 0421 10/15/16 0816  BP: (!) 98/43 (!) 87/55 (!) 97/46 (!) 92/36  Pulse: 62 66 66 63  Resp: (!) 21 19 20 18   Temp: 97.8 F (36.6 C) (!) 97.5 F (36.4  C) 97.6 F (36.4 C) 98.2 F (36.8 C)  TempSrc: Oral Oral Oral Oral  SpO2: 99% 99% 100% 99%  Weight: 122.2 kg (269 lb 6.4 oz) 122.2 kg (269 lb 6.4 oz)    Height:  5\' 5"  (1.651 m)     Physical Exam General: NAD, WDWN, obese Heart: RRR, heart sounds hard to appreciate due to body habitus Lungs: +wheeze on left, +rhonchi b/l. On 3L O2 [place]. Abdomen: soft, NT Extremities: trace LE edema b/l Dialysis Access: LU AVF, +thrill, +bruit   Intake/Output Summary (Last 24 hours) at 10/15/16 0959 Last data filed at 10/15/16 0600  Gross per 24 hour  Intake              240 ml  Output             2545 ml  Net            -  2305 ml     Additional Objective Labs: Basic Metabolic Panel:  Recent Labs Lab 10/13/16 1218 10/14/16 0326 10/15/16 0320  NA 134* 135 132*  K 4.4 4.5 3.9  CL 90* 92* 92*  CO2 31 29 28   GLUCOSE 164* 149* 124*  BUN 39* 55* 33*  CREATININE 9.51* 11.07* 6.58*  CALCIUM 9.3 9.2 8.6*  PHOS 3.9  --   --    Liver Function Tests:  Recent Labs Lab 10/14/16 0326  AST 12*  ALT 8*  ALKPHOS 36*  BILITOT 0.6  PROT 6.8  ALBUMIN 2.7*   No results for input(s): LIPASE, AMYLASE in the last 168 hours. CBC:  Recent Labs Lab 10/13/16 1112 10/13/16 1218 10/14/16 0326  WBC 15.3* 10.1 8.6  NEUTROABS  --  6.7  --   HGB 4.7* 9.4* 8.6*  HCT 15.0* 30.2* 27.9*  MCV 91.5 91.2 90.9  PLT 259 160 174    CBG:  Recent Labs Lab 10/14/16 0812 10/14/16 1233 10/14/16 1923 10/14/16 2151 10/15/16 0814  GLUCAP 129* 127* 123* 228* 110*   Studies/Results: Dg Chest Port 1 View  Result Date: 10/13/2016 CLINICAL DATA:  Shortness of breath. EXAM: PORTABLE CHEST 1 VIEW COMPARISON:  02/03/2016 FINDINGS: The cardio pericardial silhouette is enlarged. Pulmonary vascular congestion noted with diffuse interstitial prominence suggesting edema. The visualized bony structures of the thorax are intact. Telemetry leads overlie the chest. IMPRESSION: Cardiomegaly with vascular congestion and  probable interstitial edema. Electronically Signed   By: Misty Stanley M.D.   On: 10/13/2016 12:13   Medications: . sodium chloride     . aspirin EC  81 mg Oral Daily  . atorvastatin  40 mg Oral Daily  . calcitRIOL  2.75 mcg Oral Q T,Th,Sat-1800  . cinacalcet  90 mg Oral Q breakfast  . docusate sodium  100 mg Oral Daily  . feeding supplement (NEPRO CARB STEADY)  237 mL Oral TID BM  . feeding supplement (PRO-STAT SUGAR FREE 64)  30 mL Oral BID  . insulin aspart  0-5 Units Subcutaneous QHS  . insulin aspart  0-9 Units Subcutaneous TID WC  . insulin glargine  5 Units Subcutaneous QHS  . midodrine  5 mg Oral TID WC  . multivitamin  1 tablet Oral QHS  . sevelamer carbonate  800 mg Oral TID WC     I have seen and examined this patient and agree with plan and assessment in the above note with renal recommendations/intervention highlighted.  Still with shortness of breath and do not think that this is related solely to volume and may need pulmonary eval.  Hgb improved.  Continue with HD and UF and ESA.  Follow pulse ox and Hgb levels. [place] John A Shamarra Warda,MD 10/15/2016 2:08 PM

## 2016-10-15 NOTE — Progress Notes (Signed)
[facility] PROGRESS NOTE  [name] QJJ:[id number] DOB: 10-03-1945 DOA: 10/13/2016 PCP: [name], MD  Assessment/Plan:  Acute hypoxic respiratory failure: likely from acute on chronic diastolic CHF in the setting of ESRD w/ fluid overload. Last echo showing an EF of 60% and moderate LVH, with severe mitral valve calcification with mild aortic stenosis. Patient is compliant with her dialysis regimen. - dialysis per Nephrology - Daily wts, I/O - failed ambulation w/ pulse ox, will get Hd tomorrow and retry  Anemia: 4.7 on presentation ot ED. No h/o GI loss. hemoccult negative in the ED. Unsure if this lab error or from true progression of her anemia of chronic disease likely secondary to renal failure. - Transfused 2 units PRBC and H/H better  ESRD: T/TH/Sat. Fistula patent in LUE - continue renal vitamins - Dialysis per renal  DM: - continue lantus - SSI  HLD: - continue lipitor  Hypotension: - continue midodrine  DVT prophylaxis: SCD  Code Status: full  Family Communication: daugther  Disposition Plan: pending improvement after dialysis  Consults called: nephrology  Admission status: inpt    Consultants:  nephro  Procedures:  none  Antibiotics:  n/a (indicate start date, and stop date if known)  HPI/Subjective: No issues overnight.   Objective: Vitals:   10/15/16 0421 10/15/16 0816  BP: (!) 97/46 (!) 92/36  Pulse: 66 63  Resp: 20 18  Temp: 97.6 F (36.4 C) 98.2 F (36.8 C)  SpO2: 100% 99%    Intake/Output Summary (Last 24 hours) at 10/15/16 1631 Last data filed at 10/15/16 1242  Gross per 24 hour  Intake              460 ml  Output             2545 ml  Net            -2085 ml   Filed Weights   10/14/16 1338 10/14/16 1833 10/14/16 2100  Weight: 125.2 kg (276 lb 0.3 oz) 122.2 kg (269 lb 6.4 oz) 122.2 kg (269 lb 6.4 oz)    Exam:   General:  NCAT, NAD  Cardiovascular: RRR, No MRG  Respiratory: CTAB, incro  wob  Abdomen: ND, NTTP  Data Reviewed: Basic Metabolic Panel:  Recent Labs Lab 10/13/16 1218 10/14/16 0326 10/15/16 0320  NA 134* 135 132*  K 4.4 4.5 3.9  CL 90* 92* 92*  CO2 31 29 28   GLUCOSE 164* 149* 124*  BUN 39* 55* 33*  CREATININE 9.51* 11.07* 6.58*  CALCIUM 9.3 9.2 8.6*  MG 2.4  --   --   PHOS 3.9  --   --    Liver Function Tests:  Recent Labs Lab 10/14/16 0326  AST 12*  ALT 8*  ALKPHOS 36*  BILITOT 0.6  PROT 6.8  ALBUMIN 2.7*   No results for input(s): LIPASE, AMYLASE in the last 168 hours. No results for input(s): AMMONIA in the last 168 hours. CBC:  Recent Labs Lab 10/13/16 1112 10/13/16 1218 10/14/16 0326  WBC 15.3* 10.1 8.6  NEUTROABS  --  6.7  --   HGB 4.7* 9.4* 8.6*  HCT 15.0* 30.2* 27.9*  MCV 91.5 91.2 90.9  PLT 259 160 174   Cardiac Enzymes: No results for input(s): CKTOTAL, CKMB, CKMBINDEX, TROPONINI in the last 168 hours. BNP (last 3 results)  Recent Labs  01/30/16 1848 02/08/16 0602 10/13/16 1146  BNP 1,568.3* 119.1* 533.8*    ProBNP (last 3 results) No results for input(s): PROBNP in  the last 8760 hours.  CBG:  Recent Labs Lab 10/14/16 1233 10/14/16 1923 10/14/16 2151 10/15/16 0814 10/15/16 1215  GLUCAP 127* 123* 228* 110* 146*    Recent Results (from the past 240 hour(s))  MRSA PCR Screening     Status: None   Collection Time: 10/14/16 12:02 AM  Result Value Ref Range Status   MRSA by PCR NEGATIVE NEGATIVE Final    Comment:        The GeneXpert MRSA Assay (FDA approved for NASAL specimens only), is one component of a comprehensive MRSA colonization surveillance program. It is not intended to diagnose MRSA infection nor to guide or monitor treatment for MRSA infections.      Studies: No results found.  Scheduled Meds: . aspirin EC  81 mg Oral Daily  . atorvastatin  40 mg Oral Daily  . calcitRIOL  2.75 mcg Oral Q T,Th,Sat-1800  . cinacalcet  90 mg Oral Q breakfast  . docusate sodium  100 mg Oral  Daily  . feeding supplement (NEPRO CARB STEADY)  237 mL Oral TID BM  . feeding supplement (PRO-STAT SUGAR FREE 64)  30 mL Oral BID  . insulin aspart  0-5 Units Subcutaneous QHS  . insulin aspart  0-9 Units Subcutaneous TID WC  . insulin glargine  5 Units Subcutaneous QHS  . midodrine  5 mg Oral TID WC  . multivitamin  1 tablet Oral QHS  . sevelamer carbonate  800 mg Oral TID WC   Continuous Infusions: . sodium chloride      Active Problems:   ESRD needing dialysis ([place])   Diabetes mellitus with complication (HCC)   Acute on chronic combined systolic and diastolic CHF (congestive heart failure) ([place])   Anemia due to chronic kidney disease   Acute respiratory failure ([place])    Time spent: [facility] Pager AMION. If 7PM-7AM, please contact night-coverage at [email or web address], password [facility] 10/15/2016, 4:31 PM  LOS: 2 days

## 2016-10-16 ENCOUNTER — Inpatient Hospital Stay (HOSPITAL_COMMUNITY): Payer: Medicare HMO

## 2016-10-16 DIAGNOSIS — Z992 Dependence on renal dialysis: Secondary | ICD-10-CM

## 2016-10-16 LAB — BASIC METABOLIC PANEL
ANION GAP: 14 (ref 5–15)
BUN: 57 mg/dL — AB (ref 6–20)
CO2: 26 mmol/L (ref 22–32)
Calcium: 9 mg/dL (ref 8.9–10.3)
Chloride: 92 mmol/L — ABNORMAL LOW (ref 101–111)
Creatinine, Ser: 8.84 mg/dL — ABNORMAL HIGH (ref 0.44–1.00)
GFR, EST AFRICAN AMERICAN: 5 mL/min — AB (ref >60)
GFR, EST NON AFRICAN AMERICAN: 4 mL/min — AB (ref >60)
Glucose, Bld: 104 mg/dL — ABNORMAL HIGH (ref 65–99)
POTASSIUM: 4.9 mmol/L (ref 3.5–5.1)
Sodium: 132 mmol/L — ABNORMAL LOW (ref 135–145)

## 2016-10-16 LAB — GLUCOSE, CAPILLARY
GLUCOSE-CAPILLARY: 178 mg/dL — AB (ref 65–99)
Glucose-Capillary: 92 mg/dL (ref 65–99)
Glucose-Capillary: 156 mg/dL — ABNORMAL HIGH (ref 65–99)

## 2016-10-16 LAB — CBC
HEMATOCRIT: 28.3 % — AB (ref 36.0–46.0)
Hemoglobin: 8.9 g/dL — ABNORMAL LOW (ref 12.0–15.0)
MCH: 28 pg (ref 26.0–34.0)
MCHC: 31.4 g/dL (ref 30.0–36.0)
MCV: 89 fL (ref 78.0–100.0)
Platelets: 226 10*3/uL (ref 150–400)
RBC: 3.18 MIL/uL — AB (ref 3.87–5.11)
RDW: 16.1 % — AB (ref 11.5–15.5)
WBC: 9.5 10*3/uL (ref 4.0–10.5)

## 2016-10-16 MED ORDER — ALBUTEROL SULFATE (2.5 MG/3ML) 0.083% IN NEBU: 2.5000 mg | INHALATION_SOLUTION | RESPIRATORY_TRACT | Status: DC | PRN

## 2016-10-16 MED ORDER — SODIUM CHLORIDE 0.9 % IV SOLN: 100.0000 mL | INTRAVENOUS | Status: DC | PRN

## 2016-10-16 MED ORDER — ALTEPLASE 2 MG IJ SOLR: 2.0000 mg | Freq: Once | INTRAMUSCULAR | Status: DC | PRN

## 2016-10-16 MED ORDER — LIDOCAINE-PRILOCAINE 2.5-2.5 % EX CREA: 1.0000 "application " | TOPICAL_CREAM | CUTANEOUS | Status: DC | PRN

## 2016-10-16 MED ORDER — LIDOCAINE HCL (PF) 1 % IJ SOLN: 5.0000 mL | INTRAMUSCULAR | Status: DC | PRN

## 2016-10-16 MED ORDER — HEPARIN SODIUM (PORCINE) 1000 UNIT/ML DIALYSIS
20.0000 [IU]/kg | INTRAMUSCULAR | Status: DC | PRN
  Administered 2016-10-16: 2400 [IU] via INTRAVENOUS_CENTRAL

## 2016-10-16 MED ORDER — PENTAFLUOROPROP-TETRAFLUOROETH EX AERO: 1.0000 "application " | INHALATION_SPRAY | CUTANEOUS | Status: DC | PRN

## 2016-10-16 MED ORDER — MIDODRINE HCL 5 MG PO TABS
ORAL_TABLET | ORAL | Status: AC
  Filled 2016-10-16: qty 1

## 2016-10-16 MED ORDER — ALBUTEROL SULFATE (2.5 MG/3ML) 0.083% IN NEBU
2.5000 mg | INHALATION_SOLUTION | Freq: Four times a day (QID) | RESPIRATORY_TRACT | Status: DC
  Administered 2016-10-16: 2.5 mg via RESPIRATORY_TRACT
  Filled 2016-10-16: qty 3

## 2016-10-16 MED ORDER — HEPARIN SODIUM (PORCINE) 1000 UNIT/ML DIALYSIS: 1000.0000 [IU] | INTRAMUSCULAR | Status: DC | PRN

## 2016-10-16 MED ORDER — ACETAMINOPHEN 325 MG PO TABS
ORAL_TABLET | ORAL | Status: AC
  Filled 2016-10-16: qty 2

## 2016-10-16 NOTE — Plan of Care (Signed)
Problem: Safety: Goal: Ability to remain free from injury will improve Outcome: Progressing Pt uses call bell as needed and assist in ambulating in room to prevent from having falls

## 2016-10-16 NOTE — Procedures (Signed)
I was present at this dialysis session. I have reviewed the session itself and made appropriate changes.   Filed Weights   10/14/16 2100 10/15/16 2120 10/16/16 0800  Weight: 122.2 kg (269 lb 6.4 oz) 122 kg (269 lb) 123.3 kg (271 lb 13.2 oz)     Recent Labs Lab 10/13/16 1218  10/16/16 0300  NA 134*  < > 132*  K 4.4  < > 4.9  CL 90*  < > 92*  CO2 31  < > 26  GLUCOSE 164*  < > 104*  BUN 39*  < > 57*  CREATININE 9.51*  < > 8.84*  CALCIUM 9.3  < > 9.0  PHOS 3.9  --   --   < > = values in this interval not displayed.   Recent Labs Lab 10/13/16 1218 10/14/16 0326 10/16/16 0657  WBC 10.1 8.6 9.5  NEUTROABS 6.7  --   --   HGB 9.4* 8.6* 8.9*  HCT 30.2* 27.9* 28.3*  MCV 91.2 90.9 89.0  PLT 160 174 226    Scheduled Meds: . midodrine      . aspirin EC  81 mg Oral Daily  . atorvastatin  40 mg Oral Daily  . calcitRIOL  2.75 mcg Oral Q T,Th,Sat-1800  . cinacalcet  90 mg Oral Q breakfast  . docusate sodium  100 mg Oral Daily  . feeding supplement (NEPRO CARB STEADY)  237 mL Oral TID BM  . feeding supplement (PRO-STAT SUGAR FREE 64)  30 mL Oral BID  . insulin aspart  0-5 Units Subcutaneous QHS  . insulin aspart  0-9 Units Subcutaneous TID WC  . insulin glargine  5 Units Subcutaneous QHS  . midodrine  5 mg Oral TID WC  . multivitamin  1 tablet Oral QHS  . sevelamer carbonate  800 mg Oral TID WC   Continuous Infusions: . sodium chloride    . sodium chloride    . sodium chloride     PRN Meds:.sodium chloride, sodium chloride, acetaminophen **OR** acetaminophen, alteplase, bisacodyl, heparin, heparin, lidocaine (PF), lidocaine-prilocaine, ondansetron **OR** ondansetron (ZOFRAN) IV, pentafluoroprop-tetrafluoroeth    Assessment/Plan 1. SOB- improved today.  UF again as BP tolerates but may benefit from inhalers and better anemia management as her Hgb is low.   2. ESRD- cont with HD qTTS 3. Anemia- Hgb 8.9, increase micera to 230mcg every 2 weeks 4. F/E/N- cont with renal diet  and nepro. 5. Disposition- per primary Donetta Potts,  MD 10/16/2016, 9:25 AM

## 2016-10-16 NOTE — Progress Notes (Signed)
[facility] PROGRESS NOTE  DAMA HEDGEPETH QJJ:[id number] DOB: [birth year] DOA: 10/13/2016 PCP: [name], MD  Assessment/Plan:  Acute hypoxic respiratory failure: likely from acute on chronic diastolic CHF in the setting of ESRD w/ fluid overload. Last echo showing an EF of 60% and moderate LVH, with severe mitral valve calcification with mild aortic stenosis. Patient is compliant with her dialysis regimen. - dialysis per Nephrology - Daily wts, I/O - failed ambulation w/ pulse ox - Patient still very dyspneic at bedside, repeat chest x-ray shows only mild vascular congestion, VQ scan ordered along with CT chest  Anemia: 4.7 on presentation ot ED. No h/o GI loss. hemoccult negative in the ED. Unsure if this lab error or from true progression of her anemia of chronic disease likely secondary to renal failure. - Transfused 2 units PRBC and H/H better - albuterol sch and prn  ESRD: T/TH/Sat. Fistula patent in LUE - continue renal vitamins - Dialysis per renal  DM: - continue lantus - SSI  HLD: - continue lipitor  Hypotension: - continue midodrine  DVT prophylaxis: SCD  Code Status: full  Family Communication: daugther  Disposition Plan: pending improvement after dialysis  Consults called: nephrology  Admission status: inpt    Consultants:  nephro  Procedures:  none  Antibiotics:  n/a (indicate start date, and stop date if known)  HPI/Subjective: No issues overnight. Still sob.  Objective: Vitals:   10/16/16 1242 10/16/16 1400  BP: (!) 84/40 (!) 87/43  Pulse: 77 73  Resp: 18   Temp:  98.2 F (36.8 C)  SpO2: 100% 98%    Intake/Output Summary (Last 24 hours) at 10/16/16 1743 Last data filed at 10/16/16 1242  Gross per 24 hour  Intake                0 ml  Output             1422 ml  Net            -1422 ml   Filed Weights   10/15/16 2120 10/16/16 0800 10/16/16 1242  Weight: 122 kg (269 lb) 123.3 kg (271 lb 13.2 oz) 121.9 kg (268 lb  11.9 oz)    Exam:   General:  NCAT, NAD  Cardiovascular: RRR, No MRG  Respiratory: basilar crackles, incr wob  Abdomen: ND, NTTP  Data Reviewed: Basic Metabolic Panel:  Recent Labs Lab 10/13/16 1218 10/14/16 0326 10/15/16 0320 10/16/16 0300  NA 134* 135 132* 132*  K 4.4 4.5 3.9 4.9  CL 90* 92* 92* 92*  CO2 31 29 28 26   GLUCOSE 164* 149* 124* 104*  BUN 39* 55* 33* 57*  CREATININE 9.51* 11.07* 6.58* 8.84*  CALCIUM 9.3 9.2 8.6* 9.0  MG 2.4  --   --   --   PHOS 3.9  --   --   --    Liver Function Tests:  Recent Labs Lab 10/14/16 0326  AST 12*  ALT 8*  ALKPHOS 36*  BILITOT 0.6  PROT 6.8  ALBUMIN 2.7*   No results for input(s): LIPASE, AMYLASE in the last 168 hours. No results for input(s): AMMONIA in the last 168 hours. CBC:  Recent Labs Lab 10/13/16 1112 10/13/16 1218 10/14/16 0326 10/16/16 0657  WBC 15.3* 10.1 8.6 9.5  NEUTROABS  --  6.7  --   --   HGB 4.7* 9.4* 8.6* 8.9*  HCT 15.0* 30.2* 27.9* 28.3*  MCV 91.5 91.2 90.9 89.0  PLT 259 160 174 226   Cardiac  Enzymes: No results for input(s): CKTOTAL, CKMB, CKMBINDEX, TROPONINI in the last 168 hours. BNP (last 3 results)  Recent Labs  01/30/16 1848 02/08/16 0602 10/13/16 1146  BNP 1,568.3* 119.1* 533.8*    ProBNP (last 3 results) No results for input(s): PROBNP in the last 8760 hours.  CBG:  Recent Labs Lab 10/15/16 0814 10/15/16 1215 10/15/16 1701 10/15/16 2118 10/16/16 1348  GLUCAP 110* 146* 105* 198* 92    Recent Results (from the past 240 hour(s))  MRSA PCR Screening     Status: None   Collection Time: 10/14/16 12:02 AM  Result Value Ref Range Status   MRSA by PCR NEGATIVE NEGATIVE Final    Comment:        The GeneXpert MRSA Assay (FDA approved for NASAL specimens only), is one component of a comprehensive MRSA colonization surveillance program. It is not intended to diagnose MRSA infection nor to guide or monitor treatment for MRSA infections.      Studies: Dg  Chest 2 View  Result Date: 10/16/2016 CLINICAL DATA:  Shortness of breath with oxygen desaturation EXAM: CHEST  2 VIEW COMPARISON:  October 15, 2016 FINDINGS: There is cardiomegaly with pulmonary venous hypertension, stable. There is no appreciable edema or consolidation. There is aortic atherosclerosis. No evident adenopathy. There is degenerative change in the thoracic spine. IMPRESSION: Persistent pulmonary vascular congestion without frank edema or consolidation. There is aortic atherosclerosis. Aortic Atherosclerosis (ICD10-I70.0). Electronically Signed   By: Lowella Grip III M.D.   On: 10/16/2016 14:39   Dg Chest Port 1 View  Result Date: 10/15/2016 CLINICAL DATA:  Shortness of breath EXAM: PORTABLE CHEST 1 VIEW COMPARISON:  10/13/2016 FINDINGS: Mild cardiomegaly with central vascular congestion. No pleural effusion. Aortic atherosclerosis. No focal consolidation. IMPRESSION: Cardiomegaly with mild central vascular congestion Electronically Signed   By: Donavan Foil M.D.   On: 10/15/2016 17:25    Scheduled Meds: . albuterol  2.5 mg Nebulization QID  . aspirin EC  81 mg Oral Daily  . atorvastatin  40 mg Oral Daily  . calcitRIOL  2.75 mcg Oral Q T,Th,Sat-1800  . cinacalcet  90 mg Oral Q breakfast  . docusate sodium  100 mg Oral Daily  . feeding supplement (NEPRO CARB STEADY)  237 mL Oral TID BM  . feeding supplement (PRO-STAT SUGAR FREE 64)  30 mL Oral BID  . insulin aspart  0-5 Units Subcutaneous QHS  . insulin aspart  0-9 Units Subcutaneous TID WC  . insulin glargine  5 Units Subcutaneous QHS  . midodrine  5 mg Oral TID WC  . multivitamin  1 tablet Oral QHS  . sevelamer carbonate  800 mg Oral TID WC   Continuous Infusions: . sodium chloride      Principal Problem:   Acute on chronic combined systolic and diastolic CHF (congestive heart failure) (HCC) Active Problems:   ESRD needing dialysis ([place])   Diabetes mellitus with complication ([place])   Anemia due to chronic  kidney disease   Acute respiratory failure ([place])    Time spent: [place]. If 7PM-7AM, please contact night-coverage at [email or web address], password [facility] 10/16/2016, 5:43 PM  LOS: 3 days

## 2016-10-17 ENCOUNTER — Inpatient Hospital Stay (HOSPITAL_COMMUNITY): Payer: Medicare HMO

## 2016-10-17 DIAGNOSIS — J441 Chronic obstructive pulmonary disease with (acute) exacerbation: Secondary | ICD-10-CM

## 2016-10-17 LAB — TYPE AND SCREEN
ABO/RH(D): A POS
ANTIBODY SCREEN: NEGATIVE
UNIT DIVISION: 0
Unit division: 0

## 2016-10-17 LAB — CBC WITH DIFFERENTIAL/PLATELET
Basophils Absolute: 0.1 10*3/uL (ref 0.0–0.1)
Basophils Relative: 1 %
Eosinophils Absolute: 0.3 10*3/uL (ref 0.0–0.7)
Eosinophils Relative: 3 %
HEMATOCRIT: 28.6 % — AB (ref 36.0–46.0)
HEMOGLOBIN: 8.6 g/dL — AB (ref 12.0–15.0)
LYMPHS ABS: 2.4 10*3/uL (ref 0.7–4.0)
Lymphocytes Relative: 26 %
MCH: 27.4 pg (ref 26.0–34.0)
MCHC: 30.1 g/dL (ref 30.0–36.0)
MCV: 91.1 fL (ref 78.0–100.0)
MONOS PCT: 11 %
Monocytes Absolute: 1 10*3/uL (ref 0.1–1.0)
NEUTROS ABS: 5.6 10*3/uL (ref 1.7–7.7)
NEUTROS PCT: 59 %
Platelets: 211 10*3/uL (ref 150–400)
RBC: 3.14 MIL/uL — AB (ref 3.87–5.11)
RDW: 16.4 % — ABNORMAL HIGH (ref 11.5–15.5)
WBC: 9.4 10*3/uL (ref 4.0–10.5)

## 2016-10-17 LAB — BASIC METABOLIC PANEL
ANION GAP: 13 (ref 5–15)
BUN: 49 mg/dL — ABNORMAL HIGH (ref 6–20)
CO2: 26 mmol/L (ref 22–32)
Calcium: 9.6 mg/dL (ref 8.9–10.3)
Chloride: 96 mmol/L — ABNORMAL LOW (ref 101–111)
Creatinine, Ser: 6.15 mg/dL — ABNORMAL HIGH (ref 0.44–1.00)
GFR calc non Af Amer: 6 mL/min — ABNORMAL LOW (ref >60)
GFR, EST AFRICAN AMERICAN: 7 mL/min — AB (ref >60)
Glucose, Bld: 201 mg/dL — ABNORMAL HIGH (ref 65–99)
Potassium: 4.1 mmol/L (ref 3.5–5.1)
Sodium: 135 mmol/L (ref 135–145)

## 2016-10-17 LAB — BPAM RBC
Blood Product Expiration Date: 201810092359
Blood Product Expiration Date: 201810092359
UNIT TYPE AND RH: 6200
Unit Type and Rh: 6200

## 2016-10-17 LAB — GLUCOSE, CAPILLARY
GLUCOSE-CAPILLARY: 158 mg/dL — AB (ref 65–99)
GLUCOSE-CAPILLARY: 319 mg/dL — AB (ref 65–99)
Glucose-Capillary: 159 mg/dL — ABNORMAL HIGH (ref 65–99)
Glucose-Capillary: 182 mg/dL — ABNORMAL HIGH (ref 65–99)

## 2016-10-17 MED ORDER — TIOTROPIUM BROMIDE MONOHYDRATE 18 MCG IN CAPS
18.0000 ug | ORAL_CAPSULE | Freq: Every day | RESPIRATORY_TRACT | Status: DC
  Administered 2016-10-20: 18 ug via RESPIRATORY_TRACT
  Filled 2016-10-17: qty 5

## 2016-10-17 MED ORDER — TECHNETIUM TC 99M DIETHYLENETRIAME-PENTAACETIC ACID
30.0000 | Freq: Once | INTRAVENOUS | Status: AC | PRN
  Administered 2016-10-17: 30 via INTRAVENOUS

## 2016-10-17 MED ORDER — DOXYCYCLINE HYCLATE 100 MG PO TABS
100.0000 mg | ORAL_TABLET | Freq: Two times a day (BID) | ORAL | Status: DC
Start: 2016-10-17 — End: 2016-10-17

## 2016-10-17 MED ORDER — ALBUTEROL SULFATE (2.5 MG/3ML) 0.083% IN NEBU
2.5000 mg | INHALATION_SOLUTION | Freq: Three times a day (TID) | RESPIRATORY_TRACT | Status: DC
  Administered 2016-10-17 – 2016-10-18 (×4): 2.5 mg via RESPIRATORY_TRACT
  Filled 2016-10-17 (×5): qty 3

## 2016-10-17 MED ORDER — TECHNETIUM TO 99M ALBUMIN AGGREGATED
4.0000 | Freq: Once | INTRAVENOUS | Status: AC | PRN
  Administered 2016-10-17: 4 via INTRAVENOUS

## 2016-10-17 MED ORDER — DOXYCYCLINE HYCLATE 100 MG PO TABS
100.0000 mg | ORAL_TABLET | Freq: Two times a day (BID) | ORAL | Status: DC
  Administered 2016-10-17 – 2016-10-21 (×8): 100 mg via ORAL
  Filled 2016-10-17 (×8): qty 1

## 2016-10-17 MED ORDER — METHYLPREDNISOLONE SODIUM SUCC 125 MG IJ SOLR
60.0000 mg | Freq: Four times a day (QID) | INTRAMUSCULAR | Status: DC
  Administered 2016-10-17 – 2016-10-21 (×14): 60 mg via INTRAVENOUS
  Filled 2016-10-17 (×15): qty 2

## 2016-10-17 NOTE — Progress Notes (Signed)
[facility] Progress Note   Dialysis Orders: TTS, 4.5hrs, BFR 450, DFR 800, 2K/2Ca,  EDW 124.5kg LU AVF Heparin 13000U IV bolus Mircera 164mg IV q2wks - recently lowered, last 1585m on 9/22 Calcitriol 2.7538mPO q HD  Last labs - 9/18: Hgb 8.5, Ca 9.6, P 3.9, K 5.3. 8/23: TSAT 31%, Albumin 3.8 PTH 245  Assessment/Plan: 1. Acute hypoxic respiratory failure likely 2/2 CHF and ESRD -Volume Overloaded             -Continue to titrate down volume with HD, stress fluid restriction -ECHO 9/26: EF 55-60%, severe MAC with mild functioning MS   -CT 9/27: Mild centrilobular emphysema.  Dependent atelectasis.  -V-Q scan ordered for today 2.   ESRD- TTS -HD yesterday, tolerated well, net UF 1422m43most weight 121.9kg.  Continue HD on regular schedule TTS, while admitted, orders written.  3. Hypotension/volume -BP chronically soft. On midodrine TID. Monitor. -Continue to titrate off excess volume. Under EDW, will need to assess and D/c with new EDW. 4. Anemia- -Stable 8.6. Continue to follow. 100mc37manesp IV q wk (on Sat) ordered to start 9/29. - Last mircera 9/22.0/97 Metabolic bone disease -Ca 9.6 and P 3.9 stable, within goal.  -Continue 2.75mcg11mcitriol PO qHD, Sensipar 90mg q31menvela 800mg 3a43mD 6. Nutrition- -Renal diet, Nepro TID, Renavite qd 7. DM on insulin - managed by PCP  Lindsay Jen [place]18,10:35 AM  LOS: 4 days   Subjective:   Patient is resting in bed, eating breakfast. States she is felling better but is still SOB.  Admits to bad headache post HD yesterday but it resolved with tylenol. Denies CP, abdominal pain, N/V/D, edema, and chills.   Objective Vitals:   10/16/16 2221 10/17/16 0458 10/17/16 0818 10/17/16 0940  BP:  (!) 115/50  (!) 106/49  Pulse: 76 77  70  Resp: _0 Temp: 98.1 F (36.7 C) 98.6 F (37 C)  98.1 F (36.7 C)  TempSrc: Oral Oral  Oral  SpO2: 96% 98% 99% 100%  Weight:      Height:       Physical Exam General:NAD, obese females Heart:RRR, heart sounds hard to appreciate due to body habitus Lungs:+rhonchi b/l. On 3L O2 via [place]. Increased respiratory effort Abdomen:soft, NT, +BS Extremities:trace edema b/l Dialysis Access: LU AVF, +bruit   Filed Weights   10/15/16 2120 10/16/16 0800 10/16/16 1242  Weight: 122 kg (269 lb) 123.3 kg (271 lb 13.2 oz) 121.9 kg (268 lb 11.9 oz)    Intake/Output Summary (Last 24 hours) at 10/17/16 1035 Last data filed at 10/17/16 0941  Gross per 24 hour  Intake              460 ml  Output             1422 ml  Net             -962 ml    Additional Objective Labs: Basic Metabolic Panel:  Recent Labs Lab 10/13/16 1218  10/15/16 0320 10/16/16 0300 10/17/16 0308  NA 134*  < > 132* 132* 135  K 4.4  < > 3.9 4.9 4.1  CL 90*  < > 92* 92* 96*  CO2 31  < > _1 GLUCOSE 164*  < > 124* 104* 201*  BUN 39*  < > 33* 57* 49*  CREATININE 9.51*  < > 6.58* 8.84* 6.15*  CALCIUM 9.3  < > 8.6*  9.0 9.6  PHOS 3.9  --   --   --   --   < > = values in this interval not displayed. Liver Function Tests:  Recent Labs Lab 10/14/16 0326  AST 12*  ALT 8*  ALKPHOS 36*  BILITOT 0.6  PROT 6.8  ALBUMIN 2.7*   CBC:  Recent Labs Lab 10/13/16 1112 10/13/16 1218 10/14/16 0326 10/16/16 0657 10/17/16 0308  WBC 15.3* 10.1 8.6 9.5 9.4  NEUTROABS  --  6.7  --   --  5.6  HGB 4.7* 9.4* 8.6* 8.9* 8.6*  HCT 15.0* 30.2* 27.9* 28.3* 28.6*  MCV 91.5 91.2 90.9 89.0 91.1  PLT 259 160 174 226 211   CBG:  Recent Labs Lab 10/15/16 1701 10/15/16 2118 10/16/16 1348 10/16/16 1823 10/16/16 2207  GLUCAP 105* 198* 92 156* 178*   Lab Results  Component Value Date   INR 1.10 07/28/2015   INR 1.16 12/04/2014   INR 1.02 08/24/2012   Studies/Results: Dg Chest 2 View  Result Date: 10/16/2016 CLINICAL DATA:   Shortness of breath with oxygen desaturation EXAM: CHEST  2 VIEW COMPARISON:  October 15, 2016 FINDINGS: There is cardiomegaly with pulmonary venous hypertension, stable. There is no appreciable edema or consolidation. There is aortic atherosclerosis. No evident adenopathy. There is degenerative change in the thoracic spine. IMPRESSION: Persistent pulmonary vascular congestion without frank edema or consolidation. There is aortic atherosclerosis. Aortic Atherosclerosis (ICD10-I70.0). Electronically Signed   By: Lowella Grip III M.D.   On: 10/16/2016 14:39   Ct Chest Wo Contrast  Result Date: 10/16/2016 CLINICAL DATA:  Shortness of breath for 4 days. Shortness breath for 4 days. History of CHF, diabetes, end-stage renal disease on dialysis. EXAM: CT CHEST WITHOUT CONTRAST TECHNIQUE: Multidetector CT imaging of the chest was performed following the standard protocol without IV contrast. COMPARISON:  Chest radiograph October 16, 2016 FINDINGS: CARDIOVASCULAR: The heart is mildly enlarged. Mitral annular calcifications. No pericardial effusion. Thoracic aorta is normal course and caliber, moderate calcific atherosclerosis. MEDIASTINUM/NODES: Enlarged LEFT thyroid lobe with coarse calcifications, substernal extent but no dominant nodule by noncontrast CT. No mediastinal mass. No lymphadenopathy by CT size criteria. Normal appearance of thoracic esophagus though not tailored for evaluation. LUNGS/PLEURA: Tracheobronchial tree is patent, no pneumothorax. LEFT basilar atelectasis. Mild centrilobular emphysema. Mild apical bullous changes. 2 mm RIGHT lower lobe pulmonary nodule, below size followup recommendation. Scattered calcified granulomas. UPPER ABDOMEN: Small hiatal hernia. Mildly dilated esophagus with air-fluid level. MUSCULOSKELETAL: Included soft tissues and included osseous structures are nonacute. RIGHT posterior chest wall varicosities. Cystic degenerative change bilateral glenoid. Mild  degenerative change of thoracic spine. IMPRESSION: 1. Mild cardiomegaly. 2. Mild centrilobular emphysema.  Dependent atelectasis. 3. LEFT thyromegaly with substernal extent, no dominant nodule. Aortic Atherosclerosis (ICD10-I70.0) and Emphysema (ICD10-J43.9). Electronically Signed   By: Elon Alas M.D.   On: 10/16/2016 21:14   Dg Chest Port 1 View  Result Date: 10/15/2016 CLINICAL DATA:  Shortness of breath EXAM: PORTABLE CHEST 1 VIEW COMPARISON:  10/13/2016 FINDINGS: Mild cardiomegaly with central vascular congestion. No pleural effusion. Aortic atherosclerosis. No focal consolidation. IMPRESSION: Cardiomegaly with mild central vascular congestion Electronically Signed   By: Donavan Foil M.D.   On: 10/15/2016 17:25    Medications: . sodium chloride     . albuterol  2.5 mg Nebulization TID  . aspirin EC  81 mg Oral Daily  . atorvastatin  40 mg Oral Daily  . calcitRIOL  2.75 mcg Oral Q T,Th,Sat-1800  . cinacalcet  90 mg  Oral Q breakfast  . docusate sodium  100 mg Oral Daily  . feeding supplement (NEPRO CARB STEADY)  237 mL Oral TID BM  . feeding supplement (PRO-STAT SUGAR FREE 64)  30 mL Oral BID  . insulin aspart  0-5 Units Subcutaneous QHS  . insulin aspart  0-9 Units Subcutaneous TID WC  . insulin glargine  5 Units Subcutaneous QHS  . methylPREDNISolone (SOLU-MEDROL) injection  60 mg Intravenous QID  . midodrine  5 mg Oral TID WC  . multivitamin  1 tablet Oral QHS  . sevelamer carbonate  800 mg Oral TID WC    I have seen and examined this patient and agree with plan and assessment in the above note with renal recommendations/intervention highlighted.  Await results of V/Q scan Governor Rooks Doneen Ollinger,MD 10/17/2016 11:23 AM

## 2016-10-17 NOTE — Progress Notes (Signed)
[facility] PROGRESS NOTE  [name] EXH:[id number] DOB: [birth year] DOA: 10/13/2016 PCP: [name], MD  Assessment/Plan:  Acute hypoxic respiratory failure: likely from acute on chronic diastolic CHF in the setting of ESRD w/ fluid overload. Last echo showing an EF of 60% and moderate LVH, with severe mitral valve calcification with mild aortic stenosis. Patient is compliant with her dialysis regimen. - dialysis per Nephrology - Daily wts, I/O - failed ambulation w/ pulse ox - Patient still again very dyspneic at bedside, repeat chest x-ray shows only mild vascular congestion, VQ scan low prob, ordered along with CT chest that showed emphysema - we'll treat patient as COPD exacerbation, inhalers, steroids, doxy - albuterol sch and prn  Anemia: 4.7 on presentation ot ED. No h/o GI loss. hemoccult negative in the ED. Unsure if this lab error or from true progression of her anemia of chronic disease likely secondary to renal failure. - Transfused 2 units PRBC and H/H better  ESRD: T/TH/Sat. Fistula patent in LUE - continue renal vitamins - Dialysis per renal  DM: - continue lantus - SSI  HLD: - continue lipitor  Hypotension: - continue midodrine  DVT prophylaxis: SCD  Code Status: full  Family Communication: daugther  Disposition Plan: pending improvement after dialysis  Consults called: nephrology  Admission status: inpt    Consultants:  nephro  Procedures:  none  Antibiotics:  n/a (indicate start date, and stop date if known)  HPI/Subjective: No issues overnight. Still sob.  Objective: Vitals:   10/17/16 0940 10/17/16 1421  BP: (!) 106/49   Pulse: 70   Resp: 18   Temp: 98.1 F (36.7 C)   SpO2: 100% 100%    Intake/Output Summary (Last 24 hours) at 10/17/16 1719 Last data filed at 10/17/16 1300  Gross per 24 hour  Intake              700 ml  Output                0 ml  Net              700 ml   Filed Weights   10/15/16 2120  10/16/16 0800 10/16/16 1242  Weight: 122 kg (269 lb) 123.3 kg (271 lb 13.2 oz) 121.9 kg (268 lb 11.9 oz)    Exam:   General:  NCAT, NAD  Cardiovascular: RRR, No MRG  Respiratory: basilar crackles, incr wob  Abdomen: ND, NTTP  Data Reviewed: Basic Metabolic Panel:  Recent Labs Lab 10/13/16 1218 10/14/16 0326 10/15/16 0320 10/16/16 0300 10/17/16 0308  NA 134* 135 132* 132* 135  K 4.4 4.5 3.9 4.9 4.1  CL 90* 92* 92* 92* 96*  CO2 31 29 28 26 26   GLUCOSE 164* 149* 124* 104* 201*  BUN 39* 55* 33* 57* 49*  CREATININE 9.51* 11.07* 6.58* 8.84* 6.15*  CALCIUM 9.3 9.2 8.6* 9.0 9.6  MG 2.4  --   --   --   --   PHOS 3.9  --   --   --   --    Liver Function Tests:  Recent Labs Lab 10/14/16 0326  AST 12*  ALT 8*  ALKPHOS 36*  BILITOT 0.6  PROT 6.8  ALBUMIN 2.7*   No results for input(s): LIPASE, AMYLASE in the last 168 hours. No results for input(s): AMMONIA in the last 168 hours. CBC:  Recent Labs Lab 10/13/16 1112 10/13/16 1218 10/14/16 0326 10/16/16 0657 10/17/16 0308  WBC 15.3* 10.1 8.6 9.5 9.4  NEUTROABS  --  6.7  --   --  5.6  HGB 4.7* 9.4* 8.6* 8.9* 8.6*  HCT 15.0* 30.2* 27.9* 28.3* 28.6*  MCV 91.5 91.2 90.9 89.0 91.1  PLT 259 160 174 226 211   Cardiac Enzymes: No results for input(s): CKTOTAL, CKMB, CKMBINDEX, TROPONINI in the last 168 hours. BNP (last 3 results)  Recent Labs  01/30/16 1848 02/08/16 0602 10/13/16 1146  BNP 1,568.3* 119.1* 533.8*    ProBNP (last 3 results) No results for input(s): PROBNP in the last 8760 hours.  CBG:  Recent Labs Lab 10/16/16 1823 10/16/16 2207 10/17/16 0817 10/17/16 1221 10/17/16 1642  GLUCAP 156* 178* 158* 159* 182*    Recent Results (from the past 240 hour(s))  MRSA PCR Screening     Status: None   Collection Time: 10/14/16 12:02 AM  Result Value Ref Range Status   MRSA by PCR NEGATIVE NEGATIVE Final    Comment:        The GeneXpert MRSA Assay (FDA approved for NASAL specimens only), is  one component of a comprehensive MRSA colonization surveillance program. It is not intended to diagnose MRSA infection nor to guide or monitor treatment for MRSA infections.      Studies: Dg Chest 2 View  Result Date: 10/16/2016 CLINICAL DATA:  Shortness of breath with oxygen desaturation EXAM: CHEST  2 VIEW COMPARISON:  October 15, 2016 FINDINGS: There is cardiomegaly with pulmonary venous hypertension, stable. There is no appreciable edema or consolidation. There is aortic atherosclerosis. No evident adenopathy. There is degenerative change in the thoracic spine. IMPRESSION: Persistent pulmonary vascular congestion without frank edema or consolidation. There is aortic atherosclerosis. Aortic Atherosclerosis (ICD10-I70.0). Electronically Signed   By: Lowella Grip III M.D.   On: 10/16/2016 14:39   Ct Chest Wo Contrast  Result Date: 10/16/2016 CLINICAL DATA:  Shortness of breath for 4 days. Shortness breath for 4 days. History of CHF, diabetes, end-stage renal disease on dialysis. EXAM: CT CHEST WITHOUT CONTRAST TECHNIQUE: Multidetector CT imaging of the chest was performed following the standard protocol without IV contrast. COMPARISON:  Chest radiograph October 16, 2016 FINDINGS: CARDIOVASCULAR: The heart is mildly enlarged. Mitral annular calcifications. No pericardial effusion. Thoracic aorta is normal course and caliber, moderate calcific atherosclerosis. MEDIASTINUM/NODES: Enlarged LEFT thyroid lobe with coarse calcifications, substernal extent but no dominant nodule by noncontrast CT. No mediastinal mass. No lymphadenopathy by CT size criteria. Normal appearance of thoracic esophagus though not tailored for evaluation. LUNGS/PLEURA: Tracheobronchial tree is patent, no pneumothorax. LEFT basilar atelectasis. Mild centrilobular emphysema. Mild apical bullous changes. 2 mm RIGHT lower lobe pulmonary nodule, below size followup recommendation. Scattered calcified granulomas. UPPER  ABDOMEN: Small hiatal hernia. Mildly dilated esophagus with air-fluid level. MUSCULOSKELETAL: Included soft tissues and included osseous structures are nonacute. RIGHT posterior chest wall varicosities. Cystic degenerative change bilateral glenoid. Mild degenerative change of thoracic spine. IMPRESSION: 1. Mild cardiomegaly. 2. Mild centrilobular emphysema.  Dependent atelectasis. 3. LEFT thyromegaly with substernal extent, no dominant nodule. Aortic Atherosclerosis (ICD10-I70.0) and Emphysema (ICD10-J43.9). Electronically Signed   By: Elon Alas M.D.   On: 10/16/2016 21:14   Nm Pulmonary Perf And Vent  Result Date: 10/17/2016 CLINICAL DATA:  Shortness of breath post dialysis. EXAM: NUCLEAR MEDICINE VENTILATION - PERFUSION LUNG SCAN TECHNIQUE: Ventilation images were obtained in multiple projections using inhaled aerosol Tc-60m DTPA. Perfusion images were obtained in multiple projections after intravenous injection of Tc-3m MAA. RADIOPHARMACEUTICALS:  30 MCi Technetium-45m DTPA aerosol inhalation and 4 mCi Technetium-95m MAA IV COMPARISON:  CT 10/16/2016.  Chest  x-ray 10/16/2016. FINDINGS: No ventilation perfusion defects noted suggest pulmonary embolus. Small ventilatory defects, larger than perfusion defects. IMPRESSION: Low probability pulmonary embolus. Electronically Signed   By: Marcello Moores  Register   On: 10/17/2016 12:06   Dg Chest Port 1 View  Result Date: 10/15/2016 CLINICAL DATA:  Shortness of breath EXAM: PORTABLE CHEST 1 VIEW COMPARISON:  10/13/2016 FINDINGS: Mild cardiomegaly with central vascular congestion. No pleural effusion. Aortic atherosclerosis. No focal consolidation. IMPRESSION: Cardiomegaly with mild central vascular congestion Electronically Signed   By: Donavan Foil M.D.   On: 10/15/2016 17:25    Scheduled Meds: . albuterol  2.5 mg Nebulization TID  . aspirin EC  81 mg Oral Daily  . atorvastatin  40 mg Oral Daily  . calcitRIOL  2.75 mcg Oral Q T,Th,Sat-1800  .  cinacalcet  90 mg Oral Q breakfast  . docusate sodium  100 mg Oral Daily  . feeding supplement (NEPRO CARB STEADY)  237 mL Oral TID BM  . feeding supplement (PRO-STAT SUGAR FREE 64)  30 mL Oral BID  . insulin aspart  0-5 Units Subcutaneous QHS  . insulin aspart  0-9 Units Subcutaneous TID WC  . insulin glargine  5 Units Subcutaneous QHS  . methylPREDNISolone (SOLU-MEDROL) injection  60 mg Intravenous QID  . midodrine  5 mg Oral TID WC  . multivitamin  1 tablet Oral QHS  . sevelamer carbonate  800 mg Oral TID WC   Continuous Infusions: . sodium chloride      Principal Problem:   Acute on chronic combined systolic and diastolic CHF (congestive heart failure) (HCC) Active Problems:   ESRD needing dialysis ([place])   Diabetes mellitus with complication ([place])   Anemia due to chronic kidney disease   Acute respiratory failure ([place])    Time spent: [place]. If 7PM-7AM, please contact night-coverage at [email or web address], password [facility] 10/17/2016, 5:19 PM  LOS: 4 days

## 2016-10-17 NOTE — Care Management Important Message (Signed)
Important Message  Patient Details  Name: [name] MRN: [id number] Date of Birth: 12/27/1945   Medicare Important Message Given:  Yes    Nathen May 10/17/2016, 1:39 PM

## 2016-10-18 LAB — BASIC METABOLIC PANEL
Anion gap: 15 (ref 5–15)
BUN: 94 mg/dL — AB (ref 6–20)
CHLORIDE: 91 mmol/L — AB (ref 101–111)
CO2: 25 mmol/L (ref 22–32)
CREATININE: 8.9 mg/dL — AB (ref 0.44–1.00)
Calcium: 10.4 mg/dL — ABNORMAL HIGH (ref 8.9–10.3)
GFR calc Af Amer: 5 mL/min — ABNORMAL LOW (ref >60)
GFR calc non Af Amer: 4 mL/min — ABNORMAL LOW (ref >60)
GLUCOSE: 334 mg/dL — AB (ref 65–99)
Potassium: 4.4 mmol/L (ref 3.5–5.1)
Sodium: 131 mmol/L — ABNORMAL LOW (ref 135–145)

## 2016-10-18 LAB — CBC WITH DIFFERENTIAL/PLATELET
Basophils Absolute: 0 10*3/uL (ref 0.0–0.1)
Basophils Relative: 0 %
EOS ABS: 0 10*3/uL (ref 0.0–0.7)
EOS PCT: 0 %
HCT: 27.3 % — ABNORMAL LOW (ref 36.0–46.0)
Hemoglobin: 8.6 g/dL — ABNORMAL LOW (ref 12.0–15.0)
LYMPHS ABS: 1.4 10*3/uL (ref 0.7–4.0)
Lymphocytes Relative: 10 %
MCH: 27.9 pg (ref 26.0–34.0)
MCHC: 31.5 g/dL (ref 30.0–36.0)
MCV: 88.6 fL (ref 78.0–100.0)
MONOS PCT: 5 %
Monocytes Absolute: 0.6 10*3/uL (ref 0.1–1.0)
Neutro Abs: 11.8 10*3/uL — ABNORMAL HIGH (ref 1.7–7.7)
Neutrophils Relative %: 85 %
PLATELETS: 256 10*3/uL (ref 150–400)
RBC: 3.08 MIL/uL — ABNORMAL LOW (ref 3.87–5.11)
RDW: 16.2 % — ABNORMAL HIGH (ref 11.5–15.5)
WBC: 13.9 10*3/uL — ABNORMAL HIGH (ref 4.0–10.5)

## 2016-10-18 LAB — GLUCOSE, CAPILLARY
GLUCOSE-CAPILLARY: 202 mg/dL — AB (ref 65–99)
Glucose-Capillary: 290 mg/dL — ABNORMAL HIGH (ref 65–99)
Glucose-Capillary: 459 mg/dL — ABNORMAL HIGH (ref 65–99)
Glucose-Capillary: 533 mg/dL (ref 65–99)

## 2016-10-18 MED ORDER — ORAL CARE MOUTH RINSE
15.0000 mL | Freq: Two times a day (BID) | OROMUCOSAL | Status: DC
  Administered 2016-10-18 – 2016-10-21 (×5): 15 mL via OROMUCOSAL

## 2016-10-18 MED ORDER — SODIUM CHLORIDE 0.9 % IV SOLN: 100.0000 mL | INTRAVENOUS | Status: DC | PRN

## 2016-10-18 MED ORDER — HEPARIN SODIUM (PORCINE) 1000 UNIT/ML DIALYSIS
20.0000 [IU]/kg | INTRAMUSCULAR | Status: DC | PRN
  Administered 2016-10-18: 2400 [IU] via INTRAVENOUS_CENTRAL

## 2016-10-18 MED ORDER — PENTAFLUOROPROP-TETRAFLUOROETH EX AERO: 1.0000 "application " | INHALATION_SPRAY | CUTANEOUS | Status: DC | PRN

## 2016-10-18 MED ORDER — HEPARIN SODIUM (PORCINE) 1000 UNIT/ML DIALYSIS: 1000.0000 [IU] | INTRAMUSCULAR | Status: DC | PRN

## 2016-10-18 MED ORDER — DARBEPOETIN ALFA 100 MCG/0.5ML IJ SOSY
PREFILLED_SYRINGE | INTRAMUSCULAR | Status: AC
  Filled 2016-10-18: qty 0.5

## 2016-10-18 MED ORDER — DIPHENHYDRAMINE HCL 25 MG PO CAPS
ORAL_CAPSULE | ORAL | Status: AC
  Filled 2016-10-18: qty 1

## 2016-10-18 MED ORDER — MIDODRINE HCL 5 MG PO TABS
ORAL_TABLET | ORAL | Status: AC
  Filled 2016-10-18: qty 1

## 2016-10-18 MED ORDER — LIDOCAINE-PRILOCAINE 2.5-2.5 % EX CREA: 1.0000 "application " | TOPICAL_CREAM | CUTANEOUS | Status: DC | PRN

## 2016-10-18 MED ORDER — INSULIN GLARGINE 100 UNIT/ML ~~LOC~~ SOLN
10.0000 [IU] | Freq: Every day | SUBCUTANEOUS | Status: DC
  Administered 2016-10-19 – 2016-10-21 (×3): 10 [IU] via SUBCUTANEOUS
  Filled 2016-10-18 (×3): qty 0.1

## 2016-10-18 MED ORDER — INSULIN ASPART 100 UNIT/ML ~~LOC~~ SOLN
12.0000 [IU] | Freq: Once | SUBCUTANEOUS | Status: AC
  Administered 2016-10-18: 12 [IU] via SUBCUTANEOUS

## 2016-10-18 MED ORDER — LIDOCAINE HCL (PF) 1 % IJ SOLN
5.0000 mL | INTRAMUSCULAR | Status: DC | PRN
Start: 2016-10-18 — End: 2016-10-18

## 2016-10-18 MED ORDER — ALTEPLASE 2 MG IJ SOLR: 2.0000 mg | Freq: Once | INTRAMUSCULAR | Status: DC | PRN

## 2016-10-18 MED ORDER — MIDODRINE HCL 5 MG PO TABS
ORAL_TABLET | ORAL | Status: AC
  Administered 2016-10-18: 5 mg via ORAL
  Filled 2016-10-18: qty 2

## 2016-10-18 MED ORDER — DARBEPOETIN ALFA 100 MCG/0.5ML IJ SOSY
100.0000 ug | PREFILLED_SYRINGE | INTRAMUSCULAR | Status: DC
  Administered 2016-10-18: 100 ug via INTRAVENOUS

## 2016-10-18 MED ORDER — DIPHENHYDRAMINE HCL 25 MG PO CAPS
25.0000 mg | ORAL_CAPSULE | Freq: Once | ORAL | Status: AC
  Administered 2016-10-18: 25 mg via ORAL

## 2016-10-18 NOTE — Consult Note (Signed)
Reason for Consult:exertional dyspnea Referring Physician: [facility] hospitalist  [name] is an 71 y.o. female.  IRJ:JOACZYS is 71 year old female with past medical history significant for coronary artery disease history of PCI in 1980s by Dr. Sherald Barge , hypertension, diabetes mellitus, obstructive sleep apnea, morbid obesity, end-stage renal disease on hemodialysis, anemia of chronic disease,history of hiatus hernia, right eye blindness, history of congestive heart failure secondary to preserved LV systolic function, history of questionable lacunar infarct in the past, was admitted on 10/13/2016 because of progressive increasing shortness of breath for last 2 days associated with minimal exertion. Patient denies any anginal chest pain although activity is limited. Patient denies any cough or cold symptoms. Denies excessive salty food intake denies noncompliance to medication states post for hemodialysis regularly on Tuesday Thursday and Saturday. Denies any recent change in medications. Denies any abdominal pain or urinary complaints. Denies fever but complains of occasional chills. Patient continues to complain of shortness of breath with minimal exertion patient had 2-D echo done which showed normal LV systolic function with mild diastolic dysfunction. VQ scan showed low probability for PE. Patient states she has been anemic for many years her workup has been negative and is attributed to anemia of chronic disease.  Past Medical History:  Diagnosis Date  . Abdominal abscess 12-17-10   abdominal abscesses x2 ? one at this time  . Anemia   . Arthritis    "all over" (08/23/2012)  . Blind right eye    fell and crushed socket and eye fell out, was replaced at [facility]  . CHF (congestive heart failure) ([place])   . Chronic lower back pain   . Coronary artery disease    Dr Kadakia-cardiologist 2-3 x year  . Dyspnea   . Dysrhythmia    irregular, "skips beats"  . ESRD (end stage renal disease) on  dialysis ([place])    "started 12/2011; [place]; TTS" (08/23/2012  . Exertional shortness of breath   . Eye drainage    "lots; since fall 07/2011" (08/23/2012)  . Gout   . H/O hiatal hernia   . Headache(784.0)    "weekly sometimes; since I fell and hit my head in 07/2011 08/23/2012)  . Heart murmur   . History of blood transfusion    "lots before starting dialysis" (08/23/2012)  . Hypertension    sees Dr. Willey Blade  . Inhalation injury    "worked at [employer]; can't inhale polyurethane or paint, etc" (08/23/2012)  . Myocardial infarction ([place]) 1970's or 1980's  . Neuromuscular disorder ([place])    diabetic neuropathy  . OSA on CPAP   . Staph aureus infection November 2012   . Swelling of both ankles 12-17-10  . Type II diabetes mellitus ([place])    "over 10 years now" (08/23/2012)    Past Surgical History:  Procedure Laterality Date  . APPENDECTOMY    . AV FISTULA PLACEMENT  09/30/2011   Procedure: ARTERIOVENOUS (AV) FISTULA CREATION;  Surgeon: Conrad Roebuck, MD;  Location: [facility];  Service: Vascular;  Laterality: Left;  BRACHIAL-CEPHALIC  . BACK SURGERY    . BASCILIC VEIN TRANSPOSITION Left 11/10/2011   Left arm   . [place] TRANSPOSITION  01/28/2012   Procedure: BASCILIC VEIN TRANSPOSITION;  Surgeon: Conrad Donnellson, MD;  Location: [facility];  Service: Vascular;  Laterality: Left;  Second Stage   . CARDIAC CATHETERIZATION  1980's  . CATARACT EXTRACTION W/ INTRAOCULAR LENS IMPLANT Bilateral 1990's  . DILATION AND CURETTAGE OF UTERUS  1960's  .  EYE SURGERY    . HERNIA REPAIR    . INSERTION OF DIALYSIS CATHETER  01/05/2012   Procedure: INSERTION OF DIALYSIS CATHETER;  Surgeon: Conrad Turley, MD;  Location: [facility];  Service: Vascular;  Laterality: N/A;  Right Internal Jugular Placement  . INTRAOCULAR PROSTHESES INSERTION Right 2013   "fell; knocked my eye outl" (08/23/2012)  . JOINT REPLACEMENT    . LAPAROTOMY  12/18/2010   Procedure: EXPLORATORY LAPAROTOMY;  Surgeon: Rolm Bookbinder, MD;   Location: [facility];  Service: General;  Laterality: N/A;  Abdominal Seroma Evacuation  . LEFT HEART CATHETERIZATION WITH CORONARY ANGIOGRAM N/A 08/25/2012   Procedure: LEFT HEART CATHETERIZATION WITH CORONARY ANGIOGRAM;  Surgeon: Birdie Riddle, MD;  Location: [facility];  Service: Cardiovascular;  Laterality: N/A;  . LUMBAR DISC SURGERY  1970'-80's   X 3  . TOTAL KNEE ARTHROPLASTY Left 1980's  . VAGINAL HYSTERECTOMY  1970's  . VENTRAL HERNIA REPAIR  2012-2013   component separation, repair with biologic; "had 3 surgeries to fix it" (08/23/2012)  . WOUND DEBRIDEMENT  03/20/2011   Procedure: DEBRIDEMENT ABDOMINAL WOUND;  Surgeon: Rolm Bookbinder, MD;  Location: [facility];  Service: General;  Laterality: N/A;  debridement abdominal wall, placement of wound vac    Family History  Problem Relation Age of Onset  . Hypertension Mother   . Cancer Brother        spine  . Hypertension Father   . Cancer Sister        brain  . Hypertension Maternal Grandmother   . Anesthesia problems Neg Hx     Social History:  reports that she quit smoking about 35 years ago. Her smoking use included Cigarettes. She has a 4.00 pack-year smoking history. She has never used smokeless tobacco. She reports that she does not drink alcohol or use drugs.  Allergies: No Known Allergies  Medications: I have reviewed the patient's current medications.  Results for orders placed or performed during the hospital encounter of 10/13/16 (from the past 48 hour(s))  Glucose, capillary     Status: Abnormal   Collection Time: 10/16/16  6:23 PM  Result Value Ref Range   Glucose-Capillary 156 (H) 65 - 99 mg/dL  Glucose, capillary     Status: Abnormal   Collection Time: 10/16/16 10:07 PM  Result Value Ref Range   Glucose-Capillary 178 (H) 65 - 99 mg/dL  Basic metabolic panel     Status: Abnormal   Collection Time: 10/17/16  3:08 AM  Result Value Ref Range   Sodium 135 135 - 145 mmol/L   Potassium 4.1 3.5 - 5.1 mmol/L   Chloride  96 (L) 101 - 111 mmol/L   CO2 26 22 - 32 mmol/L   Glucose, Bld 201 (H) 65 - 99 mg/dL   BUN 49 (H) 6 - 20 mg/dL   Creatinine, Ser 6.15 (H) 0.44 - 1.00 mg/dL   Calcium 9.6 8.9 - 10.3 mg/dL   GFR calc non Af Amer 6 (L) >60 mL/min   GFR calc Af Amer 7 (L) >60 mL/min    Comment: (NOTE) The eGFR has been calculated using the CKD EPI equation. This calculation has not been validated in all clinical situations. eGFR's persistently <60 mL/min signify possible Chronic Kidney Disease.    Anion gap 13 5 - 15  CBC with Differential/Platelet     Status: Abnormal   Collection Time: 10/17/16  3:08 AM  Result Value Ref Range   WBC 9.4 4.0 - 10.5 K/uL   RBC  3.14 (L) 3.87 - 5.11 MIL/uL   Hemoglobin 8.6 (L) 12.0 - 15.0 g/dL   HCT 28.6 (L) 36.0 - 46.0 %   MCV 91.1 78.0 - 100.0 fL   MCH 27.4 26.0 - 34.0 pg   MCHC 30.1 30.0 - 36.0 g/dL   RDW 16.4 (H) 11.5 - 15.5 %   Platelets 211 150 - 400 K/uL   Neutrophils Relative % 59 %   Neutro Abs 5.6 1.7 - 7.7 K/uL   Lymphocytes Relative 26 %   Lymphs Abs 2.4 0.7 - 4.0 K/uL   Monocytes Relative 11 %   Monocytes Absolute 1.0 0.1 - 1.0 K/uL   Eosinophils Relative 3 %   Eosinophils Absolute 0.3 0.0 - 0.7 K/uL   Basophils Relative 1 %   Basophils Absolute 0.1 0.0 - 0.1 K/uL  Glucose, capillary     Status: Abnormal   Collection Time: 10/17/16  8:17 AM  Result Value Ref Range   Glucose-Capillary 158 (H) 65 - 99 mg/dL  Glucose, capillary     Status: Abnormal   Collection Time: 10/17/16 12:21 PM  Result Value Ref Range   Glucose-Capillary 159 (H) 65 - 99 mg/dL  Glucose, capillary     Status: Abnormal   Collection Time: 10/17/16  4:42 PM  Result Value Ref Range   Glucose-Capillary 182 (H) 65 - 99 mg/dL  Glucose, capillary     Status: Abnormal   Collection Time: 10/17/16  9:35 PM  Result Value Ref Range   Glucose-Capillary 319 (H) 65 - 99 mg/dL  Basic metabolic panel     Status: Abnormal   Collection Time: 10/18/16  5:53 AM  Result Value Ref Range    Sodium 131 (L) 135 - 145 mmol/L   Potassium 4.4 3.5 - 5.1 mmol/L   Chloride 91 (L) 101 - 111 mmol/L   CO2 25 22 - 32 mmol/L   Glucose, Bld 334 (H) 65 - 99 mg/dL   BUN 94 (H) 6 - 20 mg/dL   Creatinine, Ser 8.90 (H) 0.44 - 1.00 mg/dL   Calcium 10.4 (H) 8.9 - 10.3 mg/dL   GFR calc non Af Amer 4 (L) >60 mL/min   GFR calc Af Amer 5 (L) >60 mL/min    Comment: (NOTE) The eGFR has been calculated using the CKD EPI equation. This calculation has not been validated in all clinical situations. eGFR's persistently <60 mL/min signify possible Chronic Kidney Disease.    Anion gap 15 5 - 15  CBC with Differential/Platelet     Status: Abnormal   Collection Time: 10/18/16  5:53 AM  Result Value Ref Range   WBC 13.9 (H) 4.0 - 10.5 K/uL   RBC 3.08 (L) 3.87 - 5.11 MIL/uL   Hemoglobin 8.6 (L) 12.0 - 15.0 g/dL   HCT 27.3 (L) 36.0 - 46.0 %   MCV 88.6 78.0 - 100.0 fL   MCH 27.9 26.0 - 34.0 pg   MCHC 31.5 30.0 - 36.0 g/dL   RDW 16.2 (H) 11.5 - 15.5 %   Platelets 256 150 - 400 K/uL   Neutrophils Relative % 85 %   Neutro Abs 11.8 (H) 1.7 - 7.7 K/uL   Lymphocytes Relative 10 %   Lymphs Abs 1.4 0.7 - 4.0 K/uL   Monocytes Relative 5 %   Monocytes Absolute 0.6 0.1 - 1.0 K/uL   Eosinophils Relative 0 %   Eosinophils Absolute 0.0 0.0 - 0.7 K/uL   Basophils Relative 0 %   Basophils Absolute 0.0 0.0 - 0.1  K/uL  Glucose, capillary     Status: Abnormal   Collection Time: 10/18/16  9:41 AM  Result Value Ref Range   Glucose-Capillary 202 (H) 65 - 99 mg/dL  Glucose, capillary     Status: Abnormal   Collection Time: 10/18/16  5:07 PM  Result Value Ref Range   Glucose-Capillary 290 (H) 65 - 99 mg/dL    Ct Chest Wo Contrast  Result Date: 10/16/2016 CLINICAL DATA:  Shortness of breath for 4 days. Shortness breath for 4 days. History of CHF, diabetes, end-stage renal disease on dialysis. EXAM: CT CHEST WITHOUT CONTRAST TECHNIQUE: Multidetector CT imaging of the chest was performed following the standard  protocol without IV contrast. COMPARISON:  Chest radiograph October 16, 2016 FINDINGS: CARDIOVASCULAR: The heart is mildly enlarged. Mitral annular calcifications. No pericardial effusion. Thoracic aorta is normal course and caliber, moderate calcific atherosclerosis. MEDIASTINUM/NODES: Enlarged LEFT thyroid lobe with coarse calcifications, substernal extent but no dominant nodule by noncontrast CT. No mediastinal mass. No lymphadenopathy by CT size criteria. Normal appearance of thoracic esophagus though not tailored for evaluation. LUNGS/PLEURA: Tracheobronchial tree is patent, no pneumothorax. LEFT basilar atelectasis. Mild centrilobular emphysema. Mild apical bullous changes. 2 mm RIGHT lower lobe pulmonary nodule, below size followup recommendation. Scattered calcified granulomas. UPPER ABDOMEN: Small hiatal hernia. Mildly dilated esophagus with air-fluid level. MUSCULOSKELETAL: Included soft tissues and included osseous structures are nonacute. RIGHT posterior chest wall varicosities. Cystic degenerative change bilateral glenoid. Mild degenerative change of thoracic spine. IMPRESSION: 1. Mild cardiomegaly. 2. Mild centrilobular emphysema.  Dependent atelectasis. 3. LEFT thyromegaly with substernal extent, no dominant nodule. Aortic Atherosclerosis (ICD10-I70.0) and Emphysema (ICD10-J43.9). Electronically Signed   By: Elon Alas M.D.   On: 10/16/2016 21:14   Nm Pulmonary Perf And Vent  Result Date: 10/17/2016 CLINICAL DATA:  Shortness of breath post dialysis. EXAM: NUCLEAR MEDICINE VENTILATION - PERFUSION LUNG SCAN TECHNIQUE: Ventilation images were obtained in multiple projections using inhaled aerosol Tc-69mDTPA. Perfusion images were obtained in multiple projections after intravenous injection of Tc-964mAA. RADIOPHARMACEUTICALS:  30 MCi Technetium-9920mPA aerosol inhalation and 4 mCi Technetium-34m75m IV COMPARISON:  CT 10/16/2016.  Chest x-ray 10/16/2016. FINDINGS: No ventilation perfusion  defects noted suggest pulmonary embolus. Small ventilatory defects, larger than perfusion defects. IMPRESSION: Low probability pulmonary embolus. Electronically Signed   By: ThomMarcello Mooresgister   On: 10/17/2016 12:06    Review of Systems  Constitutional: Positive for chills and malaise/fatigue. Negative for fever.  HENT: Negative for hearing loss.   Eyes: Positive for blurred vision.  Respiratory: Positive for shortness of breath.   Cardiovascular: Negative for chest pain, leg swelling and PND.  Gastrointestinal: Negative for nausea and vomiting.  Genitourinary: Positive for dysuria.  Neurological: Positive for weakness. Negative for dizziness and seizures.   Blood pressure (!) 91/30, pulse 63, temperature 98 F (36.7 C), resp. rate 19, height '5\' 5"'  (1.651 m), weight 123.1 kg (271 lb 6.2 oz), SpO2 100 %. Physical Exam  Constitutional: She is oriented to person, place, and time.  HENT:  Head: Normocephalic and atraumatic.  Eyes: Conjunctivae are normal. Left eye exhibits no discharge. No scleral icterus.  Neck: Normal range of motion. Neck supple. No JVD present. No tracheal deviation present. No thyromegaly present.  Cardiovascular: Normal rate and regular rhythm.   Murmur (2/6 systolic murmur noted no S3 gallop) heard. Respiratory: Effort normal. No respiratory distress. She has no wheezes. She has rales.  GI: Soft. Bowel sounds are normal. She exhibits no distension. There is no tenderness. There  is no rebound and no guarding.  Musculoskeletal: She exhibits no edema, tenderness or deformity.  Neurological: She is alert and oriented to person, place, and time.    Assessment/Plan: Acute on chronic congestive heart failure secondary to preserved LV systolic function rule out ischemia Exertional dyspnea rule out ischemia CAD history of PCI remote past Hypertension Diabetes mellitus COPD Morbid obesity Obstructive sleep apnea End-stage renal disease on hemodialysis Hiatus  hernia Anemia of chronic disease Blindness right eye History of questionable lacunar infarct Plan Agree with present management and aggressive hemodialysis Will schedule for Lexiscan Myoview in a.m. Charolette Forward 10/18/2016, 6:12 PM

## 2016-10-18 NOTE — Progress Notes (Signed)
Patient's CBG=459. Opyd,MD notified. Order to give scheduled novolog and lantus and recheck CBG in one hour and notify MD. Mingo Amber, RN

## 2016-10-18 NOTE — Evaluation (Signed)
Occupational Therapy Evaluation Patient Details Name: [name] MRN: [id number] DOB: [birth year] Today's Date: 10/18/2016    History of Present Illness Pt is a 71 y.o. female who was admitted due to acute hypoxic respiratory failure likely from acute on chronic diastolic CHF in the setting of ESRD with fluid overload. PMG significant for anemia, blind R eye, CHF, chronic lower back pain, coronary lower back pain, coronary artery disease, dyspnea, ESRD, exertional shortness of breathmyocardial infarction, heart murmur, and type II diabetes mellitus.    Clinical Impression   PTA, pt was independent with cane for ADL and functional mobility and living alone. Pt currently requires min guard assist for toilet transfers and min assist for LB ADL. She presents with dyspnea on exertion, decreased activity tolerance for ADL, and decreased dynamic balance impacting her ability to participate in ADL and IADL safely at PLOF. Pt would benefit from continued OT services while admitted to improve independence with ADL and functional mobility. Recommend short-term SNF placement for continued rehabilitation post-acute D/C. Of note, pt on 3L O2 via  throughout session. Difficult to obtain accurate SpO2 measurement and pt with desaturation to <80% but able to quickly rebound to 95% with breathing techniques encouraged. OT will continue to follow while admitted.    Follow Up Recommendations  SNF;Supervision/Assistance - 24 hour    Equipment Recommendations  Other (comment) (TBD at next venue of care)    Recommendations for Other Services       Precautions / Restrictions Precautions Precautions: Fall Restrictions Weight Bearing Restrictions: No      Mobility Bed Mobility Overal bed mobility: Needs Assistance Bed Mobility: Supine to Sit     Supine to sit: Min guard     General bed mobility comments: Min guard for safety.   Transfers Overall transfer level: Needs assistance Equipment used:  None Transfers: Sit to/from Stand Sit to Stand: Min guard         General transfer comment: Min guard assist for safety.     Balance Overall balance assessment: Needs assistance Sitting-balance support: No upper extremity supported;Feet supported Sitting balance-Leahy Scale: Fair     Standing balance support: Bilateral upper extremity supported;During functional activity;No upper extremity supported;Single extremity supported Standing balance-Leahy Scale: Fair Standing balance comment: Able to statically stand without assist but unsteady with dynamic tasks.                            ADL either performed or assessed with clinical judgement   ADL Overall ADL's : Needs assistance/impaired Eating/Feeding: Set up;Sitting   Grooming: Min guard;Standing   Upper Body Bathing: Set up;Sitting   Lower Body Bathing: Minimal assistance;Sit to/from stand   Upper Body Dressing : Set up;Sitting   Lower Body Dressing: Minimal assistance;Sit to/from stand   Toilet Transfer: Min guard;Ambulation;Comfort height toilet   Toileting- Clothing Manipulation and Hygiene: Min guard;Sit to/from stand       Functional mobility during ADLs: Min guard General ADL Comments: Pt highly motivated to perform tasks independently which impacts her safety at times.      Vision Baseline Vision/History: Wears glasses (prosthetic R eye) Wears Glasses: At all times Patient Visual Report: No change from baseline Additional Comments: Pt with no sight in R eye and has prosthetic.      Perception     Praxis      Pertinent Vitals/Pain Pain Assessment: No/denies pain     Hand Dominance Right   Extremity/Trunk Assessment Upper Extremity  Assessment Upper Extremity Assessment: Generalized weakness   Lower Extremity Assessment Lower Extremity Assessment: Defer to PT evaluation       Communication Communication Communication: No difficulties   Cognition Arousal/Alertness:  Awake/alert Behavior During Therapy: WFL for tasks assessed/performed Overall Cognitive Status: Within Functional Limits for tasks assessed                                     General Comments       Exercises     Shoulder Instructions      Home Living Family/patient expects to be discharged to:: Private residence Living Arrangements: Alone   Type of Home: House Home Access: Stairs to enter [employer] of Steps: 3   Home Layout: One level     Bathroom Shower/Tub: Tub/shower unit;Walk-in shower (primarily uses walk-in)   Bathroom Toilet: Standard     Home Equipment: Environmental consultant - 2 wheels;Cane - single point;Toilet riser          Prior Functioning/Environment Level of Independence: Independent with assistive device(s)        Comments: Uses cane        OT Problem List: Decreased strength;Decreased activity tolerance;Impaired balance (sitting and/or standing);Decreased safety awareness;Decreased knowledge of use of DME or AE;Decreased knowledge of precautions;Cardiopulmonary status limiting activity      OT Treatment/Interventions: Self-care/ADL training;Therapeutic exercise;Energy conservation;DME and/or AE instruction;Therapeutic activities;Patient/family education;Balance training    OT Goals(Current goals can be found in the care plan section) Acute Rehab OT Goals Patient Stated Goal: feel better OT Goal Formulation: With patient Time For Goal Achievement: 11/01/16 Potential to Achieve Goals: Good ADL Goals Pt Will Perform Upper Body Dressing: Independently;sitting Pt Will Perform Lower Body Dressing: with modified independence;sit to/from stand Pt Will Transfer to Toilet: with modified independence;ambulating;bedside commode (BSC over toilet) Pt Will Perform Toileting - Clothing Manipulation and hygiene: with modified independence;sit to/from stand Additional ADL Goal #1: Pt will demonstrate improved activity tolerance to stand at sink  to complete 3 consecutive grooming tasks with no rest breaks.   OT Frequency: Min 1X/week   Barriers to D/C:            Co-evaluation              AM-PAC PT "6 Clicks" Daily Activity     Outcome Measure Help from another person eating meals?: None Help from another person taking care of personal grooming?: A Little Help from another person toileting, which includes using toliet, bedpan, or urinal?: A Little Help from another person bathing (including washing, rinsing, drying)?: A Little Help from another person to put on and taking off regular upper body clothing?: A Little Help from another person to put on and taking off regular lower body clothing?: A Little 6 Click Score: 19   End of Session Equipment Utilized During Treatment: Gait belt;Oxygen Nurse Communication: Mobility status  Activity Tolerance: Patient tolerated treatment well Patient left: in chair;with call bell/phone within reach  OT Visit Diagnosis: Muscle weakness (generalized) (M62.81);Other abnormalities of gait and mobility (R26.89)                Time: 7654-6503 OT Time Calculation (min): 21 min Charges:  OT General Charges $OT Visit: 1 Visit OT Evaluation $OT Eval Moderate Complexity: 1 Mod G-Codes:     Norman Herrlich, MS OTR/L  Pager: [place] A Tavarion [place] 10/18/2016, 5:13 PM

## 2016-10-18 NOTE — Progress Notes (Signed)
[facility] PROGRESS NOTE  [name] BJS:[id number] DOB: Oct 11, 1945 DOA: 10/13/2016 PCP: [name], MD  Assessment/Plan:  Acute hypoxic respiratory failure: likely from acute on chronic diastolic CHF in the setting of ESRD w/ fluid overload. Last echo showing an EF of 60% and moderate LVH, with severe mitral valve calcification with mild aortic stenosis. Patient is compliant with her dialysis regimen. - dialysis per Nephrology - Daily wts, I/O - failed ambulation w/ pulse ox - Patient still again very dyspneic at bedside, repeat chest x-ray shows only mild vascular congestion, VQ scan low prob, ordered along with CT chest that showed emphysema - we'll treat patient as COPD exacerbation, inhalers, steroids, doxy - eval case with pulm on 9/28 and they recommended OP PFTs - albuterol sch and prn - cardio consult per pt request  Anemia: 4.7 on presentation ot ED. No h/o GI loss. hemoccult negative in the ED. Unsure if this lab error or from true progression of her anemia of chronic disease likely secondary to renal failure. - Transfused 2 units PRBC and H/H better  ESRD: T/TH/Sat. Fistula patent in LUE - continue renal vitamins - Dialysis per renal  DM: - continue lantus - SSI  HLD: - continue lipitor  Hypotension: - continue midodrine  DVT prophylaxis: SCD  Code Status: full  Family Communication: daugther  Disposition Plan: pending improvement after dialysis  Consults called: nephrology  Admission status: inpt    Consultants:  nephro  Procedures:  none  Antibiotics:  n/a (indicate start date, and stop date if known)  HPI/Subjective: No issues overnight. Still sob today.  Objective: Vitals:   10/18/16 1302 10/18/16 1310  BP: (!) 81/24 (!) 91/30  Pulse: 87 63  Resp: 19   Temp: 98 F (36.7 C)   SpO2:      Intake/Output Summary (Last 24 hours) at 10/18/16 1628 Last data filed at 10/18/16 1310  Gross per 24 hour  Intake               240 ml  Output             2000 ml  Net            -1760 ml   Filed Weights   10/16/16 1242 10/18/16 0830 10/18/16 1302  Weight: 121.9 kg (268 lb 11.9 oz) 125.1 kg (275 lb 12.7 oz) 123.1 kg (271 lb 6.2 oz)    Exam:   General:  NCAT, NAD  Cardiovascular: RRR, No MRG  Respiratory: basilar crackles, incr wob  Abdomen: ND, NTTP  Data Reviewed: Basic Metabolic Panel:  Recent Labs Lab 10/13/16 1218 10/14/16 0326 10/15/16 0320 10/16/16 0300 10/17/16 0308 10/18/16 0553  NA 134* 135 132* 132* 135 131*  K 4.4 4.5 3.9 4.9 4.1 4.4  CL 90* 92* 92* 92* 96* 91*  CO2 31 29 28 26 26 25   GLUCOSE 164* 149* 124* 104* 201* 334*  BUN 39* 55* 33* 57* 49* 94*  CREATININE 9.51* 11.07* 6.58* 8.84* 6.15* 8.90*  CALCIUM 9.3 9.2 8.6* 9.0 9.6 10.4*  MG 2.4  --   --   --   --   --   PHOS 3.9  --   --   --   --   --    Liver Function Tests:  Recent Labs Lab 10/14/16 0326  AST 12*  ALT 8*  ALKPHOS 36*  BILITOT 0.6  PROT 6.8  ALBUMIN 2.7*   No results for input(s): LIPASE, AMYLASE in the last 168 hours. No  results for input(s): AMMONIA in the last 168 hours. CBC:  Recent Labs Lab 10/13/16 1218 10/14/16 0326 10/16/16 0657 10/17/16 0308 10/18/16 0553  WBC 10.1 8.6 9.5 9.4 13.9*  NEUTROABS 6.7  --   --  5.6 11.8*  HGB 9.4* 8.6* 8.9* 8.6* 8.6*  HCT 30.2* 27.9* 28.3* 28.6* 27.3*  MCV 91.2 90.9 89.0 91.1 88.6  PLT 160 174 226 211 256   Cardiac Enzymes: No results for input(s): CKTOTAL, CKMB, CKMBINDEX, TROPONINI in the last 168 hours. BNP (last 3 results)  Recent Labs  01/30/16 1848 02/08/16 0602 10/13/16 1146  BNP 1,568.3* 119.1* 533.8*    ProBNP (last 3 results) No results for input(s): PROBNP in the last 8760 hours.  CBG:  Recent Labs Lab 10/17/16 0817 10/17/16 1221 10/17/16 1642 10/17/16 2135 10/18/16 0941  GLUCAP 158* 159* 182* 319* 202*    Recent Results (from the past 240 hour(s))  MRSA PCR Screening     Status: None   Collection Time: 10/14/16  12:02 AM  Result Value Ref Range Status   MRSA by PCR NEGATIVE NEGATIVE Final    Comment:        The GeneXpert MRSA Assay (FDA approved for NASAL specimens only), is one component of a comprehensive MRSA colonization surveillance program. It is not intended to diagnose MRSA infection nor to guide or monitor treatment for MRSA infections.      Studies: Ct Chest Wo Contrast  Result Date: 10/16/2016 CLINICAL DATA:  Shortness of breath for 4 days. Shortness breath for 4 days. History of CHF, diabetes, end-stage renal disease on dialysis. EXAM: CT CHEST WITHOUT CONTRAST TECHNIQUE: Multidetector CT imaging of the chest was performed following the standard protocol without IV contrast. COMPARISON:  Chest radiograph October 16, 2016 FINDINGS: CARDIOVASCULAR: The heart is mildly enlarged. Mitral annular calcifications. No pericardial effusion. Thoracic aorta is normal course and caliber, moderate calcific atherosclerosis. MEDIASTINUM/NODES: Enlarged LEFT thyroid lobe with coarse calcifications, substernal extent but no dominant nodule by noncontrast CT. No mediastinal mass. No lymphadenopathy by CT size criteria. Normal appearance of thoracic esophagus though not tailored for evaluation. LUNGS/PLEURA: Tracheobronchial tree is patent, no pneumothorax. LEFT basilar atelectasis. Mild centrilobular emphysema. Mild apical bullous changes. 2 mm RIGHT lower lobe pulmonary nodule, below size followup recommendation. Scattered calcified granulomas. UPPER ABDOMEN: Small hiatal hernia. Mildly dilated esophagus with air-fluid level. MUSCULOSKELETAL: Included soft tissues and included osseous structures are nonacute. RIGHT posterior chest wall varicosities. Cystic degenerative change bilateral glenoid. Mild degenerative change of thoracic spine. IMPRESSION: 1. Mild cardiomegaly. 2. Mild centrilobular emphysema.  Dependent atelectasis. 3. LEFT thyromegaly with substernal extent, no dominant nodule. Aortic  Atherosclerosis (ICD10-I70.0) and Emphysema (ICD10-J43.9). Electronically Signed   By: Elon Alas M.D.   On: 10/16/2016 21:14   Nm Pulmonary Perf And Vent  Result Date: 10/17/2016 CLINICAL DATA:  Shortness of breath post dialysis. EXAM: NUCLEAR MEDICINE VENTILATION - PERFUSION LUNG SCAN TECHNIQUE: Ventilation images were obtained in multiple projections using inhaled aerosol Tc-58m DTPA. Perfusion images were obtained in multiple projections after intravenous injection of Tc-85m MAA. RADIOPHARMACEUTICALS:  30 MCi Technetium-80m DTPA aerosol inhalation and 4 mCi Technetium-56m MAA IV COMPARISON:  CT 10/16/2016.  Chest x-ray 10/16/2016. FINDINGS: No ventilation perfusion defects noted suggest pulmonary embolus. Small ventilatory defects, larger than perfusion defects. IMPRESSION: Low probability pulmonary embolus. Electronically Signed   By: Marcello Moores  Register   On: 10/17/2016 12:06    Scheduled Meds: . albuterol  2.5 mg Nebulization TID  . aspirin EC  81 mg Oral Daily  .  atorvastatin  40 mg Oral Daily  . calcitRIOL  2.75 mcg Oral Q T,Th,Sat-1800  . cinacalcet  90 mg Oral Q breakfast  . darbepoetin (ARANESP) injection - DIALYSIS  100 mcg Intravenous Q Sat-HD  . docusate sodium  100 mg Oral Daily  . doxycycline  100 mg Oral Q12H  . feeding supplement (NEPRO CARB STEADY)  237 mL Oral TID BM  . feeding supplement (PRO-STAT SUGAR FREE 64)  30 mL Oral BID  . insulin aspart  0-5 Units Subcutaneous QHS  . insulin aspart  0-9 Units Subcutaneous TID WC  . insulin glargine  5 Units Subcutaneous QHS  . methylPREDNISolone (SOLU-MEDROL) injection  60 mg Intravenous QID  . midodrine  5 mg Oral TID WC  . multivitamin  1 tablet Oral QHS  . sevelamer carbonate  800 mg Oral TID WC  . tiotropium  18 mcg Inhalation Daily   Continuous Infusions: . sodium chloride      Principal Problem:   Acute on chronic combined systolic and diastolic CHF (congestive heart failure) (HCC) Active Problems:   ESRD  needing dialysis ([place])   Diabetes mellitus with complication ([place])   Anemia due to chronic kidney disease   Acute respiratory failure ([place])    Time spent: [facility] Pager AMION. If 7PM-7AM, please contact night-coverage at [email or web address], password [facility] 10/18/2016, 4:28 PM  LOS: 5 days

## 2016-10-18 NOTE — Progress Notes (Signed)
Recheck CBG=533. MD,Opyd text paged.Awaiting call back.Incoming RN made aware. Anand Tejada, Wonda Cheng, Therapist, sports

## 2016-10-18 NOTE — Procedures (Signed)
I was present at this dialysis session. I have reviewed the session itself and made appropriate changes.   Filed Weights   10/16/16 0800 10/16/16 1242 10/18/16 0830  Weight: 123.3 kg (271 lb 13.2 oz) 121.9 kg (268 lb 11.9 oz) 125.1 kg (275 lb 12.7 oz)     Recent Labs Lab 10/13/16 1218  10/17/16 0308  NA 134*  < > 135  K 4.4  < > 4.1  CL 90*  < > 96*  CO2 31  < > 26  GLUCOSE 164*  < > 201*  BUN 39*  < > 49*  CREATININE 9.51*  < > 6.15*  CALCIUM 9.3  < > 9.6  PHOS 3.9  --   --   < > = values in this interval not displayed.   Recent Labs Lab 10/13/16 1218  10/16/16 0657 10/17/16 0308 10/18/16 0553  WBC 10.1  < > 9.5 9.4 13.9*  NEUTROABS 6.7  --   --  5.6 11.8*  HGB 9.4*  < > 8.9* 8.6* 8.6*  HCT 30.2*  < > 28.3* 28.6* 27.3*  MCV 91.2  < > 89.0 91.1 88.6  PLT 160  < > 226 211 256  < > = values in this interval not displayed.  Scheduled Meds: . midodrine      . albuterol  2.5 mg Nebulization TID  . aspirin EC  81 mg Oral Daily  . atorvastatin  40 mg Oral Daily  . calcitRIOL  2.75 mcg Oral Q T,Th,Sat-1800  . cinacalcet  90 mg Oral Q breakfast  . docusate sodium  100 mg Oral Daily  . doxycycline  100 mg Oral Q12H  . feeding supplement (NEPRO CARB STEADY)  237 mL Oral TID BM  . feeding supplement (PRO-STAT SUGAR FREE 64)  30 mL Oral BID  . insulin aspart  0-5 Units Subcutaneous QHS  . insulin aspart  0-9 Units Subcutaneous TID WC  . insulin glargine  5 Units Subcutaneous QHS  . methylPREDNISolone (SOLU-MEDROL) injection  60 mg Intravenous QID  . midodrine  5 mg Oral TID WC  . multivitamin  1 tablet Oral QHS  . sevelamer carbonate  800 mg Oral TID WC  . tiotropium  18 mcg Inhalation Daily   Continuous Infusions: . sodium chloride    . sodium chloride    . sodium chloride     PRN Meds:.sodium chloride, sodium chloride, acetaminophen **OR** acetaminophen, albuterol, alteplase, bisacodyl, heparin, heparin, lidocaine (PF), lidocaine-prilocaine, ondansetron **OR**  ondansetron (ZOFRAN) IV, pentafluoroprop-tetrafluoroeth    Dialysis Orders: TTS, 4.5hrs, BFR 450, DFR 800, 2K/2Ca,  EDW 124.5kg LU AVF Heparin 13000U IV bolus Mircera 134mcg IV q2wks - recently lowered, last 112mcg on 9/22 Calcitriol 2.78mcg PO q HD  Last labs - 9/18: Hgb 8.5, Ca 9.6, P 3.9, K 5.3. 8/23: TSAT 31%, Albumin 3.8 PTH 245  Assessment/Plan: 1. Acute hypoxic respiratory distress- due to emphysema and volume excess.  Have challenged edw and on inhalers and steroids however still dyspneic and hypoxic with ambulation. Recommend pulmonary evaluation to maximize pulmonary meds 2. ESRD continue with HD qTTS and challenge edw as above 3. Anemia due to CKD stage 5- would consider blood transfusion given ongoing dyspnea and will increase micera as an outpatient, will give Aranesp today  Tiffany Potts,  MD 10/18/2016, 8:57 AM

## 2016-10-18 NOTE — Progress Notes (Signed)
Dialysis treatment completed.  2500 mL ultrafiltrated and net fluid removal 2000 mL.    Patient status unchanged. Lung sounds diminished to ausculation in all fields. Generalized edema. Cardiac: NSR.  Disconnected lines and removed needles.  Pressure held for 10 minutes and band aid/gauze dressing applied.  Report given to bedside RN, Otila Kluver.  Patient ran most of tx with hypotensive pressures, systolic in 35'T to 01'X.  BP appeared accurate with rechecks.  Patient remained non symptomatic and stated "I run that way in my dialysis center."

## 2016-10-19 ENCOUNTER — Inpatient Hospital Stay (HOSPITAL_COMMUNITY): Payer: Medicare HMO

## 2016-10-19 DIAGNOSIS — E1122 Type 2 diabetes mellitus with diabetic chronic kidney disease: Secondary | ICD-10-CM | POA: Diagnosis not present

## 2016-10-19 DIAGNOSIS — R0602 Shortness of breath: Secondary | ICD-10-CM

## 2016-10-19 DIAGNOSIS — N186 End stage renal disease: Secondary | ICD-10-CM | POA: Diagnosis not present

## 2016-10-19 DIAGNOSIS — Z992 Dependence on renal dialysis: Secondary | ICD-10-CM | POA: Diagnosis not present

## 2016-10-19 LAB — BASIC METABOLIC PANEL
ANION GAP: 15 (ref 5–15)
BUN: 48 mg/dL — ABNORMAL HIGH (ref 6–20)
CHLORIDE: 92 mmol/L — AB (ref 101–111)
CO2: 24 mmol/L (ref 22–32)
CREATININE: 5.6 mg/dL — AB (ref 0.44–1.00)
Calcium: 9.8 mg/dL (ref 8.9–10.3)
GFR calc Af Amer: 8 mL/min — ABNORMAL LOW (ref >60)
GFR calc non Af Amer: 7 mL/min — ABNORMAL LOW (ref >60)
Glucose, Bld: 520 mg/dL (ref 65–99)
POTASSIUM: 3.8 mmol/L (ref 3.5–5.1)
Sodium: 131 mmol/L — ABNORMAL LOW (ref 135–145)

## 2016-10-19 LAB — GLUCOSE, CAPILLARY
GLUCOSE-CAPILLARY: 194 mg/dL — AB (ref 65–99)
GLUCOSE-CAPILLARY: 375 mg/dL — AB (ref 65–99)
GLUCOSE-CAPILLARY: 450 mg/dL — AB (ref 65–99)
GLUCOSE-CAPILLARY: 485 mg/dL — AB (ref 65–99)
Glucose-Capillary: 295 mg/dL — ABNORMAL HIGH (ref 65–99)
Glucose-Capillary: 224 mg/dL — ABNORMAL HIGH (ref 65–99)
Glucose-Capillary: 207 mg/dL — ABNORMAL HIGH (ref 65–99)

## 2016-10-19 LAB — CBC WITH DIFFERENTIAL/PLATELET
BASOS PCT: 0 %
Basophils Absolute: 0 10*3/uL (ref 0.0–0.1)
Basophils Absolute: 0 10*3/uL (ref 0.0–0.1)
Basophils Relative: 0 %
EOS ABS: 0 10*3/uL (ref 0.0–0.7)
EOS PCT: 0 %
Eosinophils Absolute: 0 10*3/uL (ref 0.0–0.7)
Eosinophils Relative: 0 %
HCT: 28.3 % — ABNORMAL LOW (ref 36.0–46.0)
HCT: 29.6 % — ABNORMAL LOW (ref 36.0–46.0)
HEMOGLOBIN: 9.8 g/dL — AB (ref 12.0–15.0)
Hemoglobin: 8.8 g/dL — ABNORMAL LOW (ref 12.0–15.0)
LYMPHS ABS: 1.4 10*3/uL (ref 0.7–4.0)
LYMPHS ABS: 1 10*3/uL (ref 0.7–4.0)
LYMPHS PCT: 8 %
LYMPHS PCT: 7 %
MCH: 29.2 pg (ref 26.0–34.0)
MCH: 28 pg (ref 26.0–34.0)
MCHC: 33.1 g/dL (ref 30.0–36.0)
MCHC: 31.1 g/dL (ref 30.0–36.0)
MCV: 88.1 fL (ref 78.0–100.0)
MCV: 90.1 fL (ref 78.0–100.0)
MONO ABS: 0.6 10*3/uL (ref 0.1–1.0)
MONOS PCT: 9 %
Monocytes Absolute: 1.6 10*3/uL — ABNORMAL HIGH (ref 0.1–1.0)
Monocytes Relative: 4 %
NEUTROS ABS: 14.8 10*3/uL — AB (ref 1.7–7.7)
Neutro Abs: 12.2 10*3/uL — ABNORMAL HIGH (ref 1.7–7.7)
Neutrophils Relative %: 83 %
Neutrophils Relative %: 89 %
PLATELETS: 256 10*3/uL (ref 150–400)
Platelets: 278 10*3/uL (ref 150–400)
RBC: 3.14 MIL/uL — ABNORMAL LOW (ref 3.87–5.11)
RBC: 3.36 MIL/uL — ABNORMAL LOW (ref 3.87–5.11)
RDW: 16.6 % — AB (ref 11.5–15.5)
RDW: 16.6 % — AB (ref 11.5–15.5)
WBC: 13.8 10*3/uL — ABNORMAL HIGH (ref 4.0–10.5)
WBC: 17.8 10*3/uL — ABNORMAL HIGH (ref 4.0–10.5)

## 2016-10-19 LAB — RENAL FUNCTION PANEL
ANION GAP: 14 (ref 5–15)
Albumin: 2.9 g/dL — ABNORMAL LOW (ref 3.5–5.0)
BUN: 82 mg/dL — ABNORMAL HIGH (ref 6–20)
CHLORIDE: 93 mmol/L — AB (ref 101–111)
CO2: 27 mmol/L (ref 22–32)
Calcium: 10.1 mg/dL (ref 8.9–10.3)
Creatinine, Ser: 7.33 mg/dL — ABNORMAL HIGH (ref 0.44–1.00)
GFR calc non Af Amer: 5 mL/min — ABNORMAL LOW (ref >60)
GFR, EST AFRICAN AMERICAN: 6 mL/min — AB (ref >60)
GLUCOSE: 291 mg/dL — AB (ref 65–99)
Phosphorus: 4.6 mg/dL (ref 2.5–4.6)
Potassium: 4.3 mmol/L (ref 3.5–5.1)
Sodium: 134 mmol/L — ABNORMAL LOW (ref 135–145)

## 2016-10-19 MED ORDER — TECHNETIUM TC 99M TETROFOSMIN IV KIT
30.0000 | PACK | Freq: Once | INTRAVENOUS | Status: AC | PRN
  Administered 2016-10-19: 30 via INTRAVENOUS

## 2016-10-19 MED ORDER — REGADENOSON 0.4 MG/5ML IV SOLN
INTRAVENOUS | Status: AC
  Filled 2016-10-19: qty 5

## 2016-10-19 MED ORDER — SODIUM CHLORIDE 0.9 % IV BOLUS (SEPSIS): 500.0000 mL | Freq: Once | INTRAVENOUS | Status: DC

## 2016-10-19 MED ORDER — INSULIN ASPART 100 UNIT/ML ~~LOC~~ SOLN
12.0000 [IU] | Freq: Once | SUBCUTANEOUS | Status: AC
  Administered 2016-10-19: 12 [IU] via SUBCUTANEOUS

## 2016-10-19 MED ORDER — ALBUTEROL SULFATE (2.5 MG/3ML) 0.083% IN NEBU
2.5000 mg | INHALATION_SOLUTION | Freq: Two times a day (BID) | RESPIRATORY_TRACT | Status: DC
  Administered 2016-10-19 – 2016-10-21 (×3): 2.5 mg via RESPIRATORY_TRACT
  Filled 2016-10-19 (×5): qty 3

## 2016-10-19 MED ORDER — REGADENOSON 0.4 MG/5ML IV SOLN
0.4000 mg | Freq: Once | INTRAVENOUS | Status: AC
  Administered 2016-10-19: 0.4 mg via INTRAVENOUS
  Filled 2016-10-19: qty 5

## 2016-10-19 MED ORDER — INSULIN ASPART 100 UNIT/ML ~~LOC~~ SOLN
10.0000 [IU] | Freq: Once | SUBCUTANEOUS | Status: AC
  Administered 2016-10-19: 10 [IU] via SUBCUTANEOUS

## 2016-10-19 MED ORDER — INSULIN ASPART 100 UNIT/ML ~~LOC~~ SOLN
8.0000 [IU] | Freq: Once | SUBCUTANEOUS | Status: AC
  Administered 2016-10-19: 8 [IU] via SUBCUTANEOUS

## 2016-10-19 NOTE — Progress Notes (Signed)
Returned from [employer] stress test in stable condition. Denies any pain or discomfort. VSS. Mild dyspnea on exertion remains. Will continue to monitor.

## 2016-10-19 NOTE — Evaluation (Signed)
Physical Therapy Evaluation Patient Details Name: [name] MRN: [id number] DOB: Oct 04, 1945 Today's Date: 10/19/2016   History of Present Illness  Pt is a 71 y.o. female who was admitted due to acute hypoxic respiratory failure likely from acute on chronic diastolic CHF in the setting of ESRD with fluid overload. PMG significant for anemia, blind R eye, CHF, chronic lower back pain, coronary lower back pain, coronary artery disease, dyspnea, ESRD, exertional shortness of breathmyocardial infarction, heart murmur, and type II diabetes mellitus.   Clinical Impression   Pt admitted with above diagnosis. Pt currently with functional limitations due to the deficits listed below (see PT Problem List). Overall walking well with RW, though noted DOE during hallway walk on [place]; Unable to get a reliable O2 sat reading during amb due to poor pleth wave; once back in room, O2 sats read 95%-99% on room air post seated rest.  Pt will benefit from skilled PT to increase their independence and safety with mobility to allow discharge to the venue listed below.       Follow Up Recommendations Home health PT    Equipment Recommendations  None recommended by PT    Recommendations for Other Services       Precautions / Restrictions Precautions Precautions: Fall      Mobility  Bed Mobility               General bed mobility comments: In recliner upon arrival  Transfers Overall transfer level: Needs assistance Equipment used: None Transfers: Sit to/from Stand Sit to Stand: Min guard         General transfer comment: Noted need for UEs to push up  Ambulation/Gait Ambulation/Gait assistance: Min guard Ambulation Distance (Feet): 150 Feet Assistive device: Rolling walker (2 wheeled) Gait Pattern/deviations: Step-through pattern     General Gait Details: Cues to self-monitor for activity tolerance; walked with RW on Room Air and noted dyspnea 2/4; unable to obtain a reliable  reading during amb (poor pleth wave)  Stairs            Wheelchair Mobility    Modified Rankin (Stroke Patients Only)       Balance     Sitting balance-Leahy Scale: Fair       Standing balance-Leahy Scale: Fair                               Pertinent Vitals/Pain Pain Assessment: No/denies pain    Home Living Family/patient expects to be discharged to:: Private residence Living Arrangements: Alone Available Help at Discharge: Family;Friend(s);Available PRN/intermittently (Aide assists approx 2 hours Mon-Fri) Type of Home: House Home Access: Stairs to enter Entrance Stairs-Rails: Right Entrance Stairs-Number of Steps: 3 Home Layout: One level Home Equipment: Walker - 2 wheels;Cane - single point;Toilet riser      Prior Function Level of Independence: Independent with assistive device(s)         Comments: Uses cane; rollator RW when needed     Hand Dominance   Dominant Hand: Right    Extremity/Trunk Assessment   Upper Extremity Assessment Upper Extremity Assessment: Defer to OT evaluation    Lower Extremity Assessment Lower Extremity Assessment: Generalized weakness       Communication   Communication: No difficulties  Cognition Arousal/Alertness: Awake/alert Behavior During Therapy: WFL for tasks assessed/performed Overall Cognitive Status: Within Functional Limits for tasks assessed  General Comments General comments (skin integrity, edema, etc.): Unable to get a reliable O2 sat reading during amb due to poor pleth wave; once back in room, O2 sats read 95%-99% on room air post seated rest    Exercises     Assessment/Plan    PT Assessment Patient needs continued PT services  PT Problem List Decreased strength;Decreased activity tolerance;Decreased balance;Decreased mobility;Decreased knowledge of use of DME;Decreased safety awareness;Decreased knowledge of  precautions;Cardiopulmonary status limiting activity;Obesity       PT Treatment Interventions DME instruction;Gait training;Stair training;Functional mobility training;Therapeutic activities;Therapeutic exercise;Balance training    PT Goals (Current goals can be found in the Care Plan section)  Acute Rehab PT Goals Patient Stated Goal: feel better PT Goal Formulation: With patient Time For Goal Achievement: 11/02/16 Potential to Achieve Goals: Good    Frequency Min 3X/week   Barriers to discharge Decreased caregiver support Lives alone    Co-evaluation               AM-PAC PT "6 Clicks" Daily Activity  Outcome Measure Difficulty turning over in bed (including adjusting bedclothes, sheets and blankets)?: A Little Difficulty moving from lying on back to sitting on the side of the bed? : A Little Difficulty sitting down on and standing up from a chair with arms (e.g., wheelchair, bedside commode, etc,.)?: A Little Help needed moving to and from a bed to chair (including a wheelchair)?: A Little Help needed walking in hospital room?: A Little Help needed climbing 3-5 steps with a railing? : A Little 6 Click Score: 18    End of Session   Activity Tolerance: Patient tolerated treatment well Patient left: in chair;with call bell/phone within reach;with family/visitor present Nurse Communication: Mobility status PT Visit Diagnosis: Other abnormalities of gait and mobility (R26.89);Other (comment) (decr functional capacity)    Time: 2694-8546 PT Time Calculation (min) (ACUTE ONLY): 15 min   Charges:   PT Evaluation $PT Eval Low Complexity: 1 Low     PT G Codes:        Roney Marion, PT  Acute Rehabilitation Services Pager [phone] Office [phone]   Colletta [place] 10/19/2016, 2:29 PM

## 2016-10-19 NOTE — Progress Notes (Signed)
During the night, patient's CBGs remained elevated.  [facility] made aware.  Orders received and carried out.    2150 - CBG 459      5 Units of Lantus given at 2205 2325 - CBG 533      12 Units of Novolog given at 2349 0059 - CBG 485       12 Units of Novolog given at 0252 0352 - CBG 375       12 Units of Novolog given at 0423 0553 - CBG 295       8 Units of Novolog given at 0656 0757 - CBG 194        Will continue to monitor patient.   Earleen Reaper RN-BC, [employer]

## 2016-10-19 NOTE — Progress Notes (Signed)
Dr. Aggie Moats notified via Amion-text page, of increased WBC-17.8, increased from yesterday-13.8; a-febrile. Will continue to monitor.

## 2016-10-19 NOTE — Progress Notes (Signed)
Subjective:  Patient denies any chest pain continues to have shortness of breath seen in nuclear medicine department tolerated stress portion of Lexiscan Myoview no acute ischemic changes were noted in the stress portion Lexiscan Myoview scan results pending  Objective:  Vital Signs in the last 24 hours: Temp:  [97.3 F (36.3 C)-98.2 F (36.8 C)] 98.2 F (36.8 C) (09/30 0557) Pulse Rate:  [54-87] 75 (09/30 0912) Resp:  [14-23] 16 (09/30 0557) BP: (80-147)/(24-59) 119/51 (09/30 0911) SpO2:  [98 %-100 %] 100 % (09/30 0557) Weight:  [123.1 kg (271 lb 6.2 oz)] 123.1 kg (271 lb 6.2 oz) (09/30 0107)  Intake/Output from previous day: 09/29 0701 - 09/30 0700 In: 340 [P.O.:340] Out: 2000  Intake/Output from this shift: No intake/output data recorded.  Physical Exam: exam unchanged  Lab Results:  Recent Labs  10/18/16 0553 10/18/16 2347  WBC 13.9* 13.8*  HGB 8.6* 8.8*  PLT 256 256    Recent Labs  10/18/16 0553 10/18/16 2340  NA 131* 131*  K 4.4 3.8  CL 91* 92*  CO2 25 24  GLUCOSE 334* 520*  BUN 94* 48*  CREATININE 8.90* 5.60*   No results for input(s): TROPONINI in the last 72 hours.  Invalid input(s): CK, MB Hepatic Function Panel No results for input(s): PROT, ALBUMIN, AST, ALT, ALKPHOS, BILITOT, BILIDIR, IBILI in the last 72 hours. No results for input(s): CHOL in the last 72 hours. No results for input(s): PROTIME in the last 72 hours.  Imaging: Imaging results have been reviewed and Nm Pulmonary Perf And Vent  Result Date: 10/17/2016 CLINICAL DATA:  Shortness of breath post dialysis. EXAM: NUCLEAR MEDICINE VENTILATION - PERFUSION LUNG SCAN TECHNIQUE: Ventilation images were obtained in multiple projections using inhaled aerosol Tc-35m DTPA. Perfusion images were obtained in multiple projections after intravenous injection of Tc-66m MAA. RADIOPHARMACEUTICALS:  30 MCi Technetium-63m DTPA aerosol inhalation and 4 mCi Technetium-59m MAA IV COMPARISON:  CT 10/16/2016.   Chest x-ray 10/16/2016. FINDINGS: No ventilation perfusion defects noted suggest pulmonary embolus. Small ventilatory defects, larger than perfusion defects. IMPRESSION: Low probability pulmonary embolus. Electronically Signed   By: Marcello Moores  Register   On: 10/17/2016 12:06    Cardiac Studies:  Assessment/Plan:  Acute on chronic congestive heart failure secondary to preserved LV systolic function rule out ischemia Exertional dyspnea rule out ischemia CAD history of PCI remote past Hypertension Diabetes mellitus COPD Morbid obesity Obstructive sleep apnea End-stage renal disease on hemodialysis Hiatus hernia Anemia of chronic disease Blindness right eye History of questionable lacunar infarct Plan Continue present management Dr. Doylene Canard to follow in a.m. Check Lexiscan Myoview results  LOS: 6 days    [name] 10/19/2016, 9:47 AM

## 2016-10-19 NOTE — Progress Notes (Signed)
Patient ID: Myrtis Ser, female   DOB: 1946-01-11, 71 y.o.   MRN: [id number]  [facility] Progress Note    Subjective:   Feels well   Objective:   BP (!) 133/56 (BP Location: Right Arm)   Pulse 86   Temp 98.3 F (36.8 C) (Oral)   Resp 20   Ht 5\' 5"  (1.651 m)   Wt 123.1 kg (271 lb 6.2 oz)   SpO2 100%   BMI 45.16 kg/m   Intake/Output: I/O last 3 completed shifts: In: 460 [P.O.:460] Out: 2000 [Other:2000]   Intake/Output this shift:  Total I/O In: 120 [P.O.:120] Out: 0  Weight change:   Physical Exam: DXA:JOINOMVEH but in NAD CVS:no rub Resp:cta MCN:OBSJGG Ext:no edema, LUE AVF +T/B  Labs: BMET  Recent Labs Lab 10/13/16 1218 10/14/16 0326 10/15/16 0320 10/16/16 0300 10/17/16 0308 10/18/16 0553 10/18/16 2340  NA 134* 135 132* 132* 135 131* 131*  K 4.4 4.5 3.9 4.9 4.1 4.4 3.8  CL 90* 92* 92* 92* 96* 91* 92*  CO2 31 29 28 26 26 25 24   GLUCOSE 164* 149* 124* 104* 201* 334* 520*  BUN 39* 55* 33* 57* 49* 94* 48*  CREATININE 9.51* 11.07* 6.58* 8.84* 6.15* 8.90* 5.60*  ALBUMIN  --  2.7*  --   --   --   --   --   CALCIUM 9.3 9.2 8.6* 9.0 9.6 10.4* 9.8  PHOS 3.9  --   --   --   --   --   --    CBC  Recent Labs Lab 10/13/16 1218  10/16/16 0657 10/17/16 0308 10/18/16 0553 10/18/16 2347  WBC 10.1  < > 9.5 9.4 13.9* 13.8*  NEUTROABS 6.7  --   --  5.6 11.8* 12.2*  HGB 9.4*  < > 8.9* 8.6* 8.6* 8.8*  HCT 30.2*  < > 28.3* 28.6* 27.3* 28.3*  MCV 91.2  < > 89.0 91.1 88.6 90.1  PLT 160  < > 226 211 256 256  < > = values in this interval not displayed.  @IMGRELPRIORS @ Medications:    . albuterol  2.5 mg Nebulization BID  . aspirin EC  81 mg Oral Daily  . atorvastatin  40 mg Oral Daily  . calcitRIOL  2.75 mcg Oral Q T,Th,Sat-1800  . cinacalcet  90 mg Oral Q breakfast  . darbepoetin (ARANESP) injection - DIALYSIS  100 mcg Intravenous Q Sat-HD  . docusate sodium  100 mg Oral Daily  . doxycycline  100 mg Oral Q12H  . feeding supplement  (NEPRO CARB STEADY)  237 mL Oral TID BM  . feeding supplement (PRO-STAT SUGAR FREE 64)  30 mL Oral BID  . insulin aspart  0-5 Units Subcutaneous QHS  . insulin aspart  0-9 Units Subcutaneous TID WC  . insulin glargine  10 Units Subcutaneous QHS  . mouth rinse  15 mL Mouth Rinse BID  . methylPREDNISolone (SOLU-MEDROL) injection  60 mg Intravenous QID  . midodrine  5 mg Oral TID WC  . multivitamin  1 tablet Oral QHS  . sevelamer carbonate  800 mg Oral TID WC  . tiotropium  18 mcg Inhalation Daily   Dialysis Orders: TTS, 4.5hrs, BFR 450, DFR 800, 2K/2Ca,  EDW 124.5kg LU AVF Heparin 13000U IV bolus Mircera 171mcg IV q2wks - recently lowered, last 134mcg on 9/22 Calcitriol 2.74mcg PO q HD  Last labs - 9/18: Hgb 8.5, Ca 9.6, P 3.9, K 5.3. 8/23: TSAT 31%, Albumin 3.8 PTH 245  Assessment/ Plan:   1. Acute hypoxic respiratory distress- due to emphysema and volume excess.  Have challenged edw and on inhalers and steroids however still dyspneic and hypoxic with ambulation. Recommend pulmonary evaluation to maximize pulmonary meds.  S/p stress test today and awaiting results to evaluate DOE. 2. ESRD continue with HD qTTS and challenge edw as above 3. Anemia due to CKD stage 5- would consider blood transfusion given ongoing dyspnea and will increase micera as an outpatient, given Aranesp 10/18/16 4. Hypotension/volume- chronic and on midodrine tid.  Will try extra HD session tomorrow for UF 5. MBD- cont with vit D, sensipar and renvela 6. Nutrition- renal diet 7. DM- per primary   Donetta Potts, MD [place] Pager [phone] 10/19/2016, 11:11 AM

## 2016-10-20 ENCOUNTER — Inpatient Hospital Stay (HOSPITAL_COMMUNITY): Payer: Medicare HMO

## 2016-10-20 LAB — GLUCOSE, CAPILLARY
GLUCOSE-CAPILLARY: 257 mg/dL — AB (ref 65–99)
GLUCOSE-CAPILLARY: 354 mg/dL — AB (ref 65–99)
Glucose-Capillary: 445 mg/dL — ABNORMAL HIGH (ref 65–99)
Glucose-Capillary: 333 mg/dL — ABNORMAL HIGH (ref 65–99)
Glucose-Capillary: 275 mg/dL — ABNORMAL HIGH (ref 65–99)
Glucose-Capillary: 256 mg/dL — ABNORMAL HIGH (ref 65–99)

## 2016-10-20 MED ORDER — PRO-STAT SUGAR FREE PO LIQD
30.0000 mL | Freq: Three times a day (TID) | ORAL | Status: DC
  Administered 2016-10-20 – 2016-10-21 (×2): 30 mL via ORAL
  Filled 2016-10-20 (×3): qty 30

## 2016-10-20 MED ORDER — TECHNETIUM TC 99M TETROFOSMIN IV KIT
30.0000 | PACK | Freq: Once | INTRAVENOUS | Status: AC
  Administered 2016-10-20: 30 via INTRAVENOUS

## 2016-10-20 MED ORDER — INSULIN ASPART 100 UNIT/ML ~~LOC~~ SOLN
10.0000 [IU] | Freq: Once | SUBCUTANEOUS | Status: AC
  Administered 2016-10-20: 10 [IU] via SUBCUTANEOUS

## 2016-10-20 MED ORDER — NEPRO/CARBSTEADY PO LIQD
237.0000 mL | Freq: Every day | ORAL | Status: DC
  Filled 2016-10-20 (×3): qty 237

## 2016-10-20 NOTE — Progress Notes (Signed)
[facility] Progress Note   Dialysis Orders: [facility] 4.5h   2/2  124.5 kg  LUA AVF  Hep 13000 Mircera 128mg IV q2wks - recently lowered, last 1571m on 9/22 Calcitriol 2.7551mPO q HD  Last labs - 9/18: Hgb 8.5, Ca 9.6, P 3.9, K 5.3. 8/23: TSAT 31%, Albumin 3.8 PTH 245  Assessment/Plan: 1. Acute hypoxic respiratory distress - volume excess and COPD -Continue to titrate down volume with HD, stress fluid restriction  -On O2 and inhalers  -ECHO 9/26: EF 55-60%, severe MAC with mild functioning MS              -CT 9/27: Mild centrilobular emphysema. Dependent atelectasis.             -V-Q scan 9/28-no indication PR. Small ventilatory defects, larger than perfusion defects  -Stress test this am.   - don't see any significant edema on CXR or CT chest, doubt this is primarily vol related.  2. Leukocytosis w/ neutrophilia    -Afebrile, WBC trending up over weekend, 9.4> 13.9>17.8, neut 14.8  -On doxycycline  3. ESRD- TTS -Tolerating HD well. Stress stestOrders written.  4. Hypotension/volume -BP variable today, stress test this am, on midodrine TID, morning dose held, monitor.   -Under EDW, weight 122.9kg today, will need to assess and D/c with new EDW. 5. Anemia- -Hgb improved, 8.8>9.8, follow trend.   -100m53mranesp IV q wk (on Sat) started 9/298/93 Metabolic bone disease -Hypercalcemia noted on today's labs, corrected Ca 10.98.   -P 4.6, stable, within goal.  -Continue 2.75mc15mlcitriol PO qHD, Sensipar 90mg 74mRenvela 800mg 325mID 7. Nutrition- -Renal diet, Nepro TID, Renavite qd 8.DM on insulin - managed by PCP  [facility] 10/20/2016,9:45 AM  LOS: 7 days   Pt seen, examined, agree w assess/plan as above with additions as indicated.  Rob [facility] pager [phone]  [phone], 4:31 PM     Subjective:   Patient sitting in bed side chair. Feeling better than at admission, but still some SOB.  Denies CP, N/V/D, weakness and edema.   Objective Vitals:   10/19/16 1947 10/19/16 2100 10/20/16 0121 10/20/16 0431  BP:  (!) 151/68  (!) 131/57  Pulse:  81  64  Resp:  19  18  Temp:  98.3 F (36.8 C)  97.6 F (36.4 C)  TempSrc:  Oral  Oral  SpO2: 99% 100%  100%  Weight:  122.9 kg (270 lb 15.1 oz) 122.9 kg (270 lb 15.1 oz)   Height:       Physical Exam General:NAD, obese Heart:RRR, hard to appreciate due to body habitus  Lungs: expiratory wheeze, increased work of breathing, on 2L O2 [place] Abdomen:soft, NT Extremities:1+pitting edema, no ulcerations Dialysis Access: LU AVF, +bruit   Filed Weights   10/19/16 0107 10/19/16 2100 10/20/16 0121  Weight: 123.1 kg (271 lb 6.2 oz) 122.9 kg (270 lb 15.1 oz) 122.9 kg (270 lb 15.1 oz)    Intake/Output Summary (Last 24 hours) at 10/20/16 0945 Last data filed at 10/20/16 0600  Gross per 24 hour  Intake              840 ml  Output                0 ml  Net              840 ml    Additional Objective Labs: Basic  Metabolic Panel:  Recent Labs Lab 10/13/16 1218  10/18/16 0553 10/18/16 2340 10/19/16 1850  NA 134*  < > 131* 131* 134*  K 4.4  < > 4.4 3.8 4.3  CL 90*  < > 91* 92* 93*  CO2 31  < > 25 24 27   GLUCOSE 164*  < > 334* 520* 291*  BUN 39*  < > 94* 48* 82*  CREATININE 9.51*  < > 8.90* 5.60* 7.33*  CALCIUM 9.3  < > 10.4* 9.8 10.1  PHOS 3.9  --   --   --  4.6  < > = values in this interval not displayed. Liver Function Tests:  Recent Labs Lab 10/14/16 0326 10/19/16 1850  AST 12*  --   ALT 8*  --   ALKPHOS 36*  --   BILITOT 0.6  --   PROT 6.8  --   ALBUMIN 2.7* 2.9*  CBC:  Recent Labs Lab 10/16/16 0657 10/17/16 0308 10/18/16 0553 10/18/16 2347 10/19/16 1345  WBC 9.5 9.4 13.9* 13.8* 17.8*  NEUTROABS  --  5.6 11.8* 12.2* 14.8*  HGB 8.9* 8.6* 8.6* 8.8* 9.8*  HCT  28.3* 28.6* 27.3* 28.3* 29.6*  MCV 89.0 91.1 88.6 90.1 88.1  PLT 226 211 256 256 278    CBG:  Recent Labs Lab 10/19/16 1709 10/19/16 2124 10/20/16 0107 10/20/16 0428 10/20/16 0734  GLUCAP 207* 450* 445* 333* 275*   Iron Studies: No results for input(s): IRON, TIBC, TRANSFERRIN, FERRITIN in the last 72 hours. Lab Results  Component Value Date   INR 1.10 07/28/2015   INR 1.16 12/04/2014   INR 1.02 08/24/2012   Studies/Results: No results found.  Medications: . sodium chloride    . sodium chloride     . albuterol  2.5 mg Nebulization BID  . aspirin EC  81 mg Oral Daily  . atorvastatin  40 mg Oral Daily  . calcitRIOL  2.75 mcg Oral Q T,Th,Sat-1800  . cinacalcet  90 mg Oral Q breakfast  . darbepoetin (ARANESP) injection - DIALYSIS  100 mcg Intravenous Q Sat-HD  . docusate sodium  100 mg Oral Daily  . doxycycline  100 mg Oral Q12H  . feeding supplement (NEPRO CARB STEADY)  237 mL Oral TID BM  . feeding supplement (PRO-STAT SUGAR FREE 64)  30 mL Oral BID  . insulin aspart  0-5 Units Subcutaneous QHS  . insulin aspart  0-9 Units Subcutaneous TID WC  . insulin glargine  10 Units Subcutaneous QHS  . mouth rinse  15 mL Mouth Rinse BID  . methylPREDNISolone (SOLU-MEDROL) injection  60 mg Intravenous QID  . midodrine  5 mg Oral TID WC  . multivitamin  1 tablet Oral QHS  . sevelamer carbonate  800 mg Oral TID WC  . tiotropium  18 mcg Inhalation Daily

## 2016-10-20 NOTE — Progress Notes (Signed)
Physical Therapy Note:  SATURATION QUALIFICATIONS: (This note is used to comply with regulatory documentation for home oxygen)  Patient Saturations on Room Air at Rest = 94%  Patient Saturations on Room Air while Ambulating = 85%  Patient Saturations on 2 Liters of oxygen while Ambulating = 91%  Please briefly explain why patient needs home oxygen: Patient requires supplemental oxygen to maintain oxygen saturations at acceptable, safe levels with physical activity.  Roney Marion, [place]  Acute Rehabilitation Services Pager [phone] Office [phone]

## 2016-10-20 NOTE — Progress Notes (Signed)
[facility] PROGRESS NOTE  [name] BTD:[id number] DOB: [birth year] DOA: 10/13/2016 PCP: [name], MD  Assessment/Plan:  Acute hypoxic respiratory failure: likely from acute on chronic diastolic CHF in the setting of ESRD w/ fluid overload. Last echo showing an EF of 60% and moderate LVH, with severe mitral valve calcification with mild aortic stenosis. Patient is compliant with her dialysis regimen. - dialysis per Nephrology - Daily wts, I/O - failed ambulation w/ pulse ox - Patient still again very dyspneic at bedside but from hall she talks rapidly and loudly, repeat chest x-ray shows only mild vascular congestion, VQ scan low prob, ordered along with CT chest that showed emphysema - we'll treat patient as COPD exacerbation, inhalers, steroids, doxy - eval case with pulm on 9/28 and they recommended OP PFTs - albuterol sch and prn - cardio consult per pt request - cardio planning stress test  Anemia: 4.7 on presentation ot ED. No h/o GI loss. hemoccult negative in the ED. Unsure if this lab error or from true progression of her anemia of chronic disease likely secondary to renal failure. - Transfused 2 units PRBC and H/H better  ESRD: T/TH/Sat. Fistula patent in LUE - continue renal vitamins - Dialysis per renal  DM: - continue lantus - SSI  HLD: - continue lipitor  Hypotension: - continue midodrine  DVT prophylaxis: SCD  Code Status: full  Family Communication: daugther  Disposition Plan: pending improvement after dialysis  Consults called: nephrology  Admission status: inpt    Consultants:  Nephro, cardio  Procedures:  none  Antibiotics:  n/a (indicate start date, and stop date if known)  HPI/Subjective: No issues overnight.   Objective: Vitals:   10/19/16 2100 10/20/16 0431  BP: (!) 151/68 (!) 131/57  Pulse: 81 64  Resp: 19 18  Temp: 98.3 F (36.8 C) 97.6 F (36.4 C)  SpO2: 100% 100%    Intake/Output Summary (Last 24  hours) at 10/20/16 1719 Last data filed at 10/20/16 0600  Gross per 24 hour  Intake              480 ml  Output                0 ml  Net              480 ml   Filed Weights   10/19/16 0107 10/19/16 2100 10/20/16 0121  Weight: 123.1 kg (271 lb 6.2 oz) 122.9 kg (270 lb 15.1 oz) 122.9 kg (270 lb 15.1 oz)    Exam:   General:  NCAT, NAD  Cardiovascular: RRR, No MRG  Respiratory: basilar crackles, incr wob  Abdomen: ND, NTTP  Data Reviewed: Basic Metabolic Panel:  Recent Labs Lab 10/16/16 0300 10/17/16 0308 10/18/16 0553 10/18/16 2340 10/19/16 1850  NA 132* 135 131* 131* 134*  K 4.9 4.1 4.4 3.8 4.3  CL 92* 96* 91* 92* 93*  CO2 26 26 25 24 27   GLUCOSE 104* 201* 334* 520* 291*  BUN 57* 49* 94* 48* 82*  CREATININE 8.84* 6.15* 8.90* 5.60* 7.33*  CALCIUM 9.0 9.6 10.4* 9.8 10.1  PHOS  --   --   --   --  4.6   Liver Function Tests:  Recent Labs Lab 10/14/16 0326 10/19/16 1850  AST 12*  --   ALT 8*  --   ALKPHOS 36*  --   BILITOT 0.6  --   PROT 6.8  --   ALBUMIN 2.7* 2.9*   No results for input(s): LIPASE,  AMYLASE in the last 168 hours. No results for input(s): AMMONIA in the last 168 hours. CBC:  Recent Labs Lab 10/16/16 0657 10/17/16 0308 10/18/16 0553 10/18/16 2347 10/19/16 1345  WBC 9.5 9.4 13.9* 13.8* 17.8*  NEUTROABS  --  5.6 11.8* 12.2* 14.8*  HGB 8.9* 8.6* 8.6* 8.8* 9.8*  HCT 28.3* 28.6* 27.3* 28.3* 29.6*  MCV 89.0 91.1 88.6 90.1 88.1  PLT 226 211 256 256 278   Cardiac Enzymes: No results for input(s): CKTOTAL, CKMB, CKMBINDEX, TROPONINI in the last 168 hours. BNP (last 3 results)  Recent Labs  01/30/16 1848 02/08/16 0602 10/13/16 1146  BNP 1,568.3* 119.1* 533.8*    ProBNP (last 3 results) No results for input(s): PROBNP in the last 8760 hours.  CBG:  Recent Labs Lab 10/19/16 2124 10/20/16 0107 10/20/16 0428 10/20/16 0734 10/20/16 1203  GLUCAP 450* 445* 333* 275* 257*    Recent Results (from the past 240 hour(s))  MRSA  PCR Screening     Status: None   Collection Time: 10/14/16 12:02 AM  Result Value Ref Range Status   MRSA by PCR NEGATIVE NEGATIVE Final    Comment:        The GeneXpert MRSA Assay (FDA approved for NASAL specimens only), is one component of a comprehensive MRSA colonization surveillance program. It is not intended to diagnose MRSA infection nor to guide or monitor treatment for MRSA infections.      Studies: Nm Myocar Multi W/spect W/wall Motion / Ef  Result Date: 10/20/2016 CLINICAL DATA:  Chest pain EXAM: MYOCARDIAL IMAGING WITH SPECT (REST AND PHARMACOLOGIC-STRESS) GATED LEFT VENTRICULAR WALL MOTION STUDY LEFT VENTRICULAR EJECTION FRACTION TECHNIQUE: Standard myocardial SPECT imaging was performed after resting intravenous injection of 10 mCi Tc-77m tetrofosmin. Subsequently, intravenous infusion of Lexiscan was performed under the supervision of the Cardiology staff. At peak effect of the drug, 30 mCi Tc-50m tetrofosmin was injected intravenously and standard myocardial SPECT imaging was performed. Quantitative gated imaging was also performed to evaluate left ventricular wall motion, and estimate left ventricular ejection fraction. COMPARISON:  None. FINDINGS: Perfusion: There is attenuation artifact in the inferior wall and apex. A small fixed perfusion defect at the apex cannot be excluded. There are no large fixed perfusion defects. There is no stress-induced ischemia. Wall Motion: Normal left ventricular wall motion. No left ventricular dilation. Left Ventricular Ejection Fraction: 69 % End diastolic volume 361 ml End systolic volume 37 ml IMPRESSION: 1. There is attenuation artifact in the inferior wall. There may be a small fixed defect at the apex but there are no large perfusion defects and there is no stress-induced ischemia. 2. Normal left ventricular wall motion. 3. Left ventricular ejection fraction 69% 4. Non invasive risk stratification*: Low risk. *2012 Appropriate Use  Criteria for Coronary Revascularization Focused Update: J Am Coll Cardiol. 4431;54(0):086-761. http://[email or web address]?articleid=[email or web address] Electronically Signed   By: Marybelle Killings M.D.   On: 10/20/2016 15:28    Scheduled Meds: . albuterol  2.5 mg Nebulization BID  . aspirin EC  81 mg Oral Daily  . atorvastatin  40 mg Oral Daily  . calcitRIOL  2.75 mcg Oral Q T,Th,Sat-1800  . cinacalcet  90 mg Oral Q breakfast  . darbepoetin (ARANESP) injection - DIALYSIS  100 mcg Intravenous Q Sat-HD  . docusate sodium  100 mg Oral Daily  . doxycycline  100 mg Oral Q12H  . feeding supplement (NEPRO CARB STEADY)  237 mL Oral Q supper  . feeding supplement (PRO-STAT SUGAR FREE 64)  30 mL Oral TID  .  insulin aspart  0-5 Units Subcutaneous QHS  . insulin aspart  0-9 Units Subcutaneous TID WC  . insulin glargine  10 Units Subcutaneous QHS  . mouth rinse  15 mL Mouth Rinse BID  . methylPREDNISolone (SOLU-MEDROL) injection  60 mg Intravenous QID  . midodrine  5 mg Oral TID WC  . multivitamin  1 tablet Oral QHS  . sevelamer carbonate  800 mg Oral TID WC  . tiotropium  18 mcg Inhalation Daily   Continuous Infusions: . sodium chloride    . sodium chloride      Principal Problem:   Acute on chronic combined systolic and diastolic CHF (congestive heart failure) (HCC) Active Problems:   ESRD needing dialysis ([place])   Diabetes mellitus with complication ([place])   Anemia due to chronic kidney disease   Acute respiratory failure ([place])    Time spent: [place]. If 7PM-7AM, please contact night-coverage at [email or web address], password [facility] 10/20/2016, 5:19 PM  LOS: 7 days

## 2016-10-20 NOTE — Progress Notes (Signed)
Physical Therapy Treatment Patient Details Name: [name] MRN: [id number] DOB: 12-08-1945 Today's Date: 10/20/2016    History of Present Illness Pt is a 71 y.o. female who was admitted due to acute hypoxic respiratory failure likely from acute on chronic diastolic CHF in the setting of ESRD with fluid overload. PMG significant for anemia, blind R eye, CHF, chronic lower back pain, coronary lower back pain, coronary artery disease, dyspnea, ESRD, exertional shortness of breathmyocardial infarction, heart murmur, and type II diabetes mellitus.     PT Comments    Continuing work on functional mobility and activity tolerance; Today's walk, able to get better pleth wave while reading O2 sats, and she desatted to the extent to need supplemental O2   Follow Up Recommendations  Home health PT     Equipment Recommendations  Oxygen   Recommendations for Other Services       Precautions / Restrictions Precautions Precautions: Fall Precaution Comments: SOB on exertion Restrictions Weight Bearing Restrictions: No    Mobility  Bed Mobility               General bed mobility comments: In recliner upon arrival  Transfers Overall transfer level: Needs assistance Equipment used: Rolling walker (2 wheeled) Transfers: Sit to/from Stand Sit to Stand: Supervision         General transfer comment: Noted need for UEs to push up; quite fatigued psot amb, with decr control of descent to sit  Ambulation/Gait Ambulation/Gait assistance: Supervision Ambulation Distance (Feet): 180 Feet Assistive device: Rolling walker (2 wheeled) Gait Pattern/deviations: Step-through pattern Gait velocity: at times, a bit fast   General Gait Details: Cues to self-monitor for activity tolerance; walked with RW on Room Air and noted dyspnea 2/4; O2 sat drop to 85% observed lowest   Stairs            Wheelchair Mobility    Modified Rankin (Stroke Patients Only)       Balance Overall  balance assessment: Needs assistance Sitting-balance support: No upper extremity supported;Feet supported Sitting balance-Leahy Scale: Good     Standing balance support: Bilateral upper extremity supported;During functional activity;No upper extremity supported;Single extremity supported Standing balance-Leahy Scale: Fair                              Cognition Arousal/Alertness: Awake/alert Behavior During Therapy: WFL for tasks assessed/performed Overall Cognitive Status: Within Functional Limits for tasks assessed                                        Exercises      General Comments General comments (skin integrity, edema, etc.): See other PT note of today's date for O2 walking qualification      Pertinent Vitals/Pain Pain Assessment: No/denies pain    Home Living                      Prior Function            PT Goals (current goals can now be found in the care plan section) Acute Rehab PT Goals Patient Stated Goal: feel better PT Goal Formulation: With patient Time For Goal Achievement: 11/02/16 Potential to Achieve Goals: Good Progress towards PT goals: Progressing toward goals    Frequency    Min 3X/week      PT Plan Current plan remains appropriate  Co-evaluation              AM-PAC PT "6 Clicks" Daily Activity  Outcome Measure  Difficulty turning over in bed (including adjusting bedclothes, sheets and blankets)?: A Little Difficulty moving from lying on back to sitting on the side of the bed? : A Little Difficulty sitting down on and standing up from a chair with arms (e.g., wheelchair, bedside commode, etc,.)?: A Little Help needed moving to and from a bed to chair (including a wheelchair)?: None Help needed walking in hospital room?: None Help needed climbing 3-5 steps with a railing? : A Little 6 Click Score: 20    End of Session Equipment Utilized During Treatment: Oxygen Activity Tolerance:  Patient tolerated treatment well Patient left: in chair;with call bell/phone within reach Nurse Communication: Mobility status PT Visit Diagnosis: Other abnormalities of gait and mobility (R26.89);Other (comment) (decr functional capacity)     Time: 4650-3546 PT Time Calculation (min) (ACUTE ONLY): 12 min  Charges:  $Gait Training: 8-22 mins                    G Codes:       Roney Marion, [place] Pager [phone] Office [place] 10/20/2016, 4:30 PM

## 2016-10-20 NOTE — Progress Notes (Signed)
Occupational Therapy Treatment Patient Details Name: [name] MRN: [id number] DOB: 07/16/1945 Today's Date: 10/20/2016    History of present illness Pt is a 71 y.o. female who was admitted due to acute hypoxic respiratory failure likely from acute on chronic diastolic CHF in the setting of ESRD with fluid overload. PMG significant for anemia, blind R eye, CHF, chronic lower back pain, coronary lower back pain, coronary artery disease, dyspnea, ESRD, exertional shortness of breathmyocardial infarction, heart murmur, and type II diabetes mellitus.    OT comments  Pt making good progress with functional goals. Pt with minimal SOB during activity in room. Pt able to identify when to take rest breaks and perform deep breathing. O2 remained 88-90% during activity.OT will continue to follow acutely  Follow Up Recommendations  Home health OT;Supervision/Assistance - 24 hour    Equipment Recommendations  None recommended by OT    Recommendations for Other Services      Precautions / Restrictions Precautions Precautions: Fall Precaution Comments: SOB on exertion Restrictions Weight Bearing Restrictions: No       Mobility Bed Mobility               General bed mobility comments: In recliner upon arrival  Transfers Overall transfer level: Needs assistance Equipment used: None;Rolling walker (2 wheeled) Transfers: Sit to/from Stand Sit to Stand: Min guard;Supervision              Balance Overall balance assessment: Needs assistance Sitting-balance support: No upper extremity supported;Feet supported Sitting balance-Leahy Scale: Good     Standing balance support: Bilateral upper extremity supported;During functional activity;No upper extremity supported;Single extremity supported Standing balance-Leahy Scale: Fair                             ADL either performed or assessed with clinical judgement   ADL Overall ADL's : Needs assistance/impaired      Grooming: Min guard;Standing;Supervision/safety       Lower Body Bathing: Sit to/from stand;Min guard       Lower Body Dressing: Sit to/from stand;Min guard   Toilet Transfer: Ambulation;Comfort height toilet;Grab bars;RW;Supervision/safety   Toileting- Clothing Manipulation and Hygiene: Sit to/from stand;Supervision/safety   Tub/ Shower Transfer: Min guard;Grab bars;Ambulation   Functional mobility during ADLs: Min guard;Supervision/safety       Vision Baseline Vision/History: Wears glasses Wears Glasses: At all times Patient Visual Report: No change from baseline     Perception     Praxis      Cognition Arousal/Alertness: Awake/alert Behavior During Therapy: WFL for tasks assessed/performed Overall Cognitive Status: Within Functional Limits for tasks assessed                                                      General Comments  pt very pleasant and cooperative    Pertinent Vitals/ Pain       Pain Assessment: No/denies pain  Home Living                                          Prior Functioning/Environment              Frequency  Min 1X/week  Progress Toward Goals  OT Goals(current goals can now be found in the care plan section)  Progress towards OT goals: Progressing toward goals     Plan Discharge plan needs to be updated    Co-evaluation                 AM-PAC PT "6 Clicks" Daily Activity     Outcome Measure   Help from another person eating meals?: None Help from another person taking care of personal grooming?: A Little Help from another person toileting, which includes using toliet, bedpan, or urinal?: A Little Help from another person bathing (including washing, rinsing, drying)?: A Little Help from another person to put on and taking off regular upper body clothing?: None Help from another person to put on and taking off regular lower body clothing?: A Little 6 Click Score:  20    End of Session Equipment Utilized During Treatment: Oxygen;Rolling walker;Gait belt  OT Visit Diagnosis: Muscle weakness (generalized) (M62.81);Other abnormalities of gait and mobility (R26.89)   Activity Tolerance Patient tolerated treatment well   Patient Left in chair;with call bell/phone within reach   Nurse Communication      Functional Assessment Tool Used: AM-PAC 6 Clicks Daily Activity   Time: 0865-7846 OT Time Calculation (min): 25 min  Charges: OT G-codes **NOT FOR INPATIENT CLASS** Functional Assessment Tool Used: AM-PAC 6 Clicks Daily Activity OT General Charges $OT Visit: 1 Visit OT Treatments $Self Care/Home Management : 8-22 mins $Therapeutic Activity: 8-22 mins     Britt Bottom 10/20/2016, 1:30 PM

## 2016-10-20 NOTE — Care Management Important Message (Signed)
Important Message  Patient Details  Name: [name] MRN: [id number] Date of Birth: Dec 30, 1945   Medicare Important Message Given:  Yes    Nathen May 10/20/2016, 9:21 AM

## 2016-10-20 NOTE — Progress Notes (Signed)
Inpatient Diabetes Program Recommendations  AACE/ADA: New Consensus Statement on Inpatient Glycemic Control (2015)  Target Ranges:  Prepandial:   less than 140 mg/dL      Peak postprandial:   less than 180 mg/dL (1-2 hours)      Critically ill patients:  140 - 180 mg/dL   Lab Results  Component Value Date   GLUCAP 257 (H) 10/20/2016   HGBA1C 6.1 (H) 10/13/2016    Review of Glycemic Control Results for [name], [name] (MRN [id number]) as of 10/20/2016 13:54  Ref. Range 10/19/2016 21:24 10/20/2016 01:07 10/20/2016 04:28 10/20/2016 07:34 10/20/2016 12:03  Glucose-Capillary Latest Ref Range: 65 - 99 mg/dL 450 (H) 445 (H) 333 (H) 275 (H) 257 (H)   Diabetes history: DM2 Outpatient Diabetes medications: Lantus 5 units Current orders for Inpatient glycemic control: Lantus 10 units + Novolog correction sensitive scale + 0-5 units hs  Inpatient Diabetes Program Recommendations:  While on high doses of steroids, Please consider: -Increase Novolog correction to resistant  Thank you, Nani Gasser. Dimitris Shanahan, RN, MSN, CDE  Diabetes Coordinator Inpatient Glycemic Control Team Team Pager [phone] (8am-5pm) 10/20/2016 1:55 PM

## 2016-10-20 NOTE — Progress Notes (Signed)
Ref: Tiffany Hatchet, MD   Subjective:  Shortness of breath continues. Afebrile. Nuclear stress test negative for reversible ischemia and showed good EF.   Objective:  Vital Signs in the last 24 hours: Temp:  [97.6 F (36.4 C)-98.2 F (36.8 C)] 97.7 F (36.5 C) (10/01 2122) Pulse Rate:  [60-64] 63 (10/01 2122) Cardiac Rhythm: Normal sinus rhythm;Heart block (10/01 1939) Resp:  [18] 18 (10/01 2122) BP: (130-134)/(46-57) 130/46 (10/01 2122) SpO2:  [98 %-100 %] 98 % (10/01 2122) Weight:  [122.9 kg (270 lb 15.1 oz)] 122.9 kg (270 lb 15.1 oz) (10/01 2122)  Physical Exam: BP Readings from Last 1 Encounters:  10/20/16 (!) 130/46    Wt Readings from Last 1 Encounters:  10/20/16 122.9 kg (270 lb 15.1 oz)    Weight change: -2.2 kg (-4 lb 13.6 oz) Body mass index is 45.09 kg/m. HEENT: [place]/AT, Eyes-Brown, Blind in right eye. Conjunctiva-Pale, Sclera-Non-icteric Neck: No JVD, No bruit, Trachea midline. Lungs:  Clearing, Bilateral. Cardiac:  Regular rhythm, normal S1 and S2, no S3. II/VI systolic murmur. Abdomen:  Soft, non-tender. BS present. Extremities:  No edema present. No cyanosis. No clubbing. AV fistula in left upper extremity. CNS: AxOx3, Cranial nerves grossly intact, moves all 4 extremities.  Skin: Warm and dry.   Intake/Output from previous day: 09/30 0701 - 10/01 0700 In: 840 [P.O.:840] Out: 0     Lab Results: BMET    Component Value Date/Time   NA 134 (L) 10/19/2016 1850   NA 131 (L) 10/18/2016 2340   NA 131 (L) 10/18/2016 0553   K 4.3 10/19/2016 1850   K 3.8 10/18/2016 2340   K 4.4 10/18/2016 0553   CL 93 (L) 10/19/2016 1850   CL 92 (L) 10/18/2016 2340   CL 91 (L) 10/18/2016 0553   CO2 27 10/19/2016 1850   CO2 24 10/18/2016 2340   CO2 25 10/18/2016 0553   GLUCOSE 291 (H) 10/19/2016 1850   GLUCOSE 520 (HH) 10/18/2016 2340   GLUCOSE 334 (H) 10/18/2016 0553   BUN 82 (H) 10/19/2016 1850   BUN 48 (H) 10/18/2016 2340   BUN 94 (H) 10/18/2016 0553   CREATININE  7.33 (H) 10/19/2016 1850   CREATININE 5.60 (H) 10/18/2016 2340   CREATININE 8.90 (H) 10/18/2016 0553   CREATININE 2.86 (H) 10/10/2010 1140   CREATININE 2.20 (H) 06/26/2010 1637   CALCIUM 10.1 10/19/2016 1850   CALCIUM 9.8 10/18/2016 2340   CALCIUM 10.4 (H) 10/18/2016 0553   CALCIUM 8.7 01/08/2010 1121   CALCIUM 9.1 10/08/2009 0922   CALCIUM 8.6 07/30/2009 1102   GFRNONAA 5 (L) 10/19/2016 1850   GFRNONAA 7 (L) 10/18/2016 2340   GFRNONAA 4 (L) 10/18/2016 0553   GFRAA 6 (L) 10/19/2016 1850   GFRAA 8 (L) 10/18/2016 2340   GFRAA 5 (L) 10/18/2016 0553   CBC    Component Value Date/Time   WBC 17.8 (H) 10/19/2016 1345   RBC 3.36 (L) 10/19/2016 1345   HGB 9.8 (L) 10/19/2016 1345   HCT 29.6 (L) 10/19/2016 1345   PLT 278 10/19/2016 1345   MCV 88.1 10/19/2016 1345   MCH 29.2 10/19/2016 1345   MCHC 33.1 10/19/2016 1345   RDW 16.6 (H) 10/19/2016 1345   LYMPHSABS 1.4 10/19/2016 1345   MONOABS 1.6 (H) 10/19/2016 1345   EOSABS 0.0 10/19/2016 1345   BASOSABS 0.0 10/19/2016 1345   HEPATIC Function Panel  Recent Labs  01/30/16 1848 10/14/16 0326  PROT 7.1 6.8   HEMOGLOBIN A1C No components found for: HGA1C,  MPG CARDIAC ENZYMES Lab Results  Component Value Date   CKTOTAL 87 04/13/2010   CKMB 1.0 04/13/2010   TROPONINI 0.03 (HH) 01/31/2016   TROPONINI 0.03 (HH) 01/30/2016   TROPONINI 0.03 (HH) 01/30/2016   BNP No results for input(s): PROBNP in the last 8760 hours. TSH  Recent Labs  02/08/16 1217  TSH 1.146   CHOLESTEROL No results for input(s): CHOL in the last 8760 hours.  Scheduled Meds: . albuterol  2.5 mg Nebulization BID  . aspirin EC  81 mg Oral Daily  . atorvastatin  40 mg Oral Daily  . calcitRIOL  2.75 mcg Oral Q T,Th,Sat-1800  . cinacalcet  90 mg Oral Q breakfast  . darbepoetin (ARANESP) injection - DIALYSIS  100 mcg Intravenous Q Sat-HD  . docusate sodium  100 mg Oral Daily  . doxycycline  100 mg Oral Q12H  . feeding supplement (NEPRO CARB STEADY)  237  mL Oral Q supper  . feeding supplement (PRO-STAT SUGAR FREE 64)  30 mL Oral TID  . insulin aspart  0-5 Units Subcutaneous QHS  . insulin aspart  0-9 Units Subcutaneous TID WC  . insulin glargine  10 Units Subcutaneous QHS  . mouth rinse  15 mL Mouth Rinse BID  . methylPREDNISolone (SOLU-MEDROL) injection  60 mg Intravenous QID  . midodrine  5 mg Oral TID WC  . multivitamin  1 tablet Oral QHS  . sevelamer carbonate  800 mg Oral TID WC  . tiotropium  18 mcg Inhalation Daily   Continuous Infusions: . sodium chloride    . sodium chloride     PRN Meds:.acetaminophen **OR** acetaminophen, albuterol, bisacodyl, ondansetron **OR** ondansetron (ZOFRAN) IV  Assessment/Plan: Acute on chronic CHF due to preserved LV systolic function Exertional dyspnea Emphysema Anemia of chronic disease Hypertension DM, II Morbid obesity ESRD OSA Blindness in right eye Mild Mitral valve stenosis Mild Aortic valve stenosis Mild pulmonary systolic hypertension   Continue medical treatment. Her shortness of breath is multifactorial, cardiac, pulmonary, musculoskeletal and anemia. Will check oxygen level with ambulation in hall in AM if she qualifies for home oxygen. Will ask renal doctor if her dry weight could be reduced further.   LOS: 7 days    Dixie Dials  MD  10/20/2016, 11:24 PM

## 2016-10-20 NOTE — Progress Notes (Signed)
[facility] PROGRESS NOTE  [name] WGN:[id number] DOB: 1945/04/01 DOA: 10/13/2016 PCP: [name], MD  Assessment/Plan:  Acute hypoxic respiratory failure: likely from acute on chronic diastolic CHF in the setting of ESRD w/ fluid overload. Last echo showing an EF of 60% and moderate LVH, with severe mitral valve calcification with mild aortic stenosis. Patient is compliant with her dialysis regimen. - dialysis per Nephrology - Daily wts, I/O - failed ambulation w/ pulse ox - Patient still again very dyspneic at bedside but from hall she talks rapidly and loudly, repeat chest x-ray shows only mild vascular congestion, VQ scan low prob, ordered along with CT chest that showed emphysema - we'll treat patient as COPD exacerbation, inhalers, steroids, doxy - eval case with pulm on 9/28 and they recommended OP PFTs - albuterol sch and prn - cardio consult per pt request - cardio planning stress test-->neg test - d/c to SNF tomorrow  Anemia: 4.7 on presentation ot ED. No h/o GI loss. hemoccult negative in the ED. Unsure if this lab error or from true progression of her anemia of chronic disease likely secondary to renal failure. - Transfused 2 units PRBC and H/H better  ESRD: T/TH/Sat. Fistula patent in LUE - continue renal vitamins - Dialysis per renal  DM: - continue lantus - SSI  HLD: - continue lipitor  Hypotension: - continue midodrine  DVT prophylaxis: SCD  Code Status: full  Family Communication: daugther  Disposition Plan: pending improvement after dialysis  Consults called: nephrology  Admission status: inpt    Consultants:  Nephro, cardio  Procedures:  none  Antibiotics:  n/a (indicate start date, and stop date if known)  HPI/Subjective: No issues overnight. States again she was told she needed chronic oxygen several months ago.  Objective: Vitals:   10/19/16 2100 10/20/16 0431  BP: (!) 151/68 (!) 131/57  Pulse: 81 64  Resp:  19 18  Temp: 98.3 F (36.8 C) 97.6 F (36.4 C)  SpO2: 100% 100%    Intake/Output Summary (Last 24 hours) at 10/20/16 1720 Last data filed at 10/20/16 0600  Gross per 24 hour  Intake              480 ml  Output                0 ml  Net              480 ml   Filed Weights   10/19/16 0107 10/19/16 2100 10/20/16 0121  Weight: 123.1 kg (271 lb 6.2 oz) 122.9 kg (270 lb 15.1 oz) 122.9 kg (270 lb 15.1 oz)    Exam:   General:  NCAT, NAD  Cardiovascular: RRR, No MRG  Respiratory: basilar crackles, incr wob less than prev exam  Abdomen: ND, NTTP  Data Reviewed: Basic Metabolic Panel:  Recent Labs Lab 10/16/16 0300 10/17/16 0308 10/18/16 0553 10/18/16 2340 10/19/16 1850  NA 132* 135 131* 131* 134*  K 4.9 4.1 4.4 3.8 4.3  CL 92* 96* 91* 92* 93*  CO2 26 26 25 24 27   GLUCOSE 104* 201* 334* 520* 291*  BUN 57* 49* 94* 48* 82*  CREATININE 8.84* 6.15* 8.90* 5.60* 7.33*  CALCIUM 9.0 9.6 10.4* 9.8 10.1  PHOS  --   --   --   --  4.6   Liver Function Tests:  Recent Labs Lab 10/14/16 0326 10/19/16 1850  AST 12*  --   ALT 8*  --   ALKPHOS 36*  --   BILITOT  0.6  --   PROT 6.8  --   ALBUMIN 2.7* 2.9*   No results for input(s): LIPASE, AMYLASE in the last 168 hours. No results for input(s): AMMONIA in the last 168 hours. CBC:  Recent Labs Lab 10/16/16 0657 10/17/16 0308 10/18/16 0553 10/18/16 2347 10/19/16 1345  WBC 9.5 9.4 13.9* 13.8* 17.8*  NEUTROABS  --  5.6 11.8* 12.2* 14.8*  HGB 8.9* 8.6* 8.6* 8.8* 9.8*  HCT 28.3* 28.6* 27.3* 28.3* 29.6*  MCV 89.0 91.1 88.6 90.1 88.1  PLT 226 211 256 256 278   Cardiac Enzymes: No results for input(s): CKTOTAL, CKMB, CKMBINDEX, TROPONINI in the last 168 hours. BNP (last 3 results)  Recent Labs  01/30/16 1848 02/08/16 0602 10/13/16 1146  BNP 1,568.3* 119.1* 533.8*    ProBNP (last 3 results) No results for input(s): PROBNP in the last 8760 hours.  CBG:  Recent Labs Lab 10/19/16 2124 10/20/16 0107  10/20/16 0428 10/20/16 0734 10/20/16 1203  GLUCAP 450* 445* 333* 275* 257*    Recent Results (from the past 240 hour(s))  MRSA PCR Screening     Status: None   Collection Time: 10/14/16 12:02 AM  Result Value Ref Range Status   MRSA by PCR NEGATIVE NEGATIVE Final    Comment:        The GeneXpert MRSA Assay (FDA approved for NASAL specimens only), is one component of a comprehensive MRSA colonization surveillance program. It is not intended to diagnose MRSA infection nor to guide or monitor treatment for MRSA infections.      Studies: Nm Myocar Multi W/spect W/wall Motion / Ef  Result Date: 10/20/2016 CLINICAL DATA:  Chest pain EXAM: MYOCARDIAL IMAGING WITH SPECT (REST AND PHARMACOLOGIC-STRESS) GATED LEFT VENTRICULAR WALL MOTION STUDY LEFT VENTRICULAR EJECTION FRACTION TECHNIQUE: Standard myocardial SPECT imaging was performed after resting intravenous injection of 10 mCi Tc-39m tetrofosmin. Subsequently, intravenous infusion of Lexiscan was performed under the supervision of the Cardiology staff. At peak effect of the drug, 30 mCi Tc-23m tetrofosmin was injected intravenously and standard myocardial SPECT imaging was performed. Quantitative gated imaging was also performed to evaluate left ventricular wall motion, and estimate left ventricular ejection fraction. COMPARISON:  None. FINDINGS: Perfusion: There is attenuation artifact in the inferior wall and apex. A small fixed perfusion defect at the apex cannot be excluded. There are no large fixed perfusion defects. There is no stress-induced ischemia. Wall Motion: Normal left ventricular wall motion. No left ventricular dilation. Left Ventricular Ejection Fraction: 69 % End diastolic volume 756 ml End systolic volume 37 ml IMPRESSION: 1. There is attenuation artifact in the inferior wall. There may be a small fixed defect at the apex but there are no large perfusion defects and there is no stress-induced ischemia. 2. Normal left  ventricular wall motion. 3. Left ventricular ejection fraction 69% 4. Non invasive risk stratification*: Low risk. *2012 Appropriate Use Criteria for Coronary Revascularization Focused Update: J Am Coll Cardiol. 4332;95(1):884-166. http://content.airportbarriers.com.aspx?articleid=1201161 Electronically Signed   By: Marybelle Killings M.D.   On: 10/20/2016 15:28    Scheduled Meds: . albuterol  2.5 mg Nebulization BID  . aspirin EC  81 mg Oral Daily  . atorvastatin  40 mg Oral Daily  . calcitRIOL  2.75 mcg Oral Q T,Th,Sat-1800  . cinacalcet  90 mg Oral Q breakfast  . darbepoetin (ARANESP) injection - DIALYSIS  100 mcg Intravenous Q Sat-HD  . docusate sodium  100 mg Oral Daily  . doxycycline  100 mg Oral Q12H  . feeding supplement (NEPRO  CARB STEADY)  237 mL Oral Q supper  . feeding supplement (PRO-STAT SUGAR FREE 64)  30 mL Oral TID  . insulin aspart  0-5 Units Subcutaneous QHS  . insulin aspart  0-9 Units Subcutaneous TID WC  . insulin glargine  10 Units Subcutaneous QHS  . mouth rinse  15 mL Mouth Rinse BID  . methylPREDNISolone (SOLU-MEDROL) injection  60 mg Intravenous QID  . midodrine  5 mg Oral TID WC  . multivitamin  1 tablet Oral QHS  . sevelamer carbonate  800 mg Oral TID WC  . tiotropium  18 mcg Inhalation Daily   Continuous Infusions: . sodium chloride    . sodium chloride      Principal Problem:   Acute on chronic combined systolic and diastolic CHF (congestive heart failure) (HCC) Active Problems:   ESRD needing dialysis ([place])   Diabetes mellitus with complication ([place])   Anemia due to chronic kidney disease   Acute respiratory failure ([place])    Time spent: [place]. If 7PM-7AM, please contact night-coverage at [email or web address], password [facility] 10/20/2016, 5:20 PM  LOS: 7 days

## 2016-10-20 NOTE — Progress Notes (Signed)
Nutrition Follow-up  DOCUMENTATION CODES:   Morbid obesity  INTERVENTION:  - Increase ProStat to TID. Each supplement provides 100 kcal and 15 grams of protein.  - Reduce Nepro to once every day with meals. Each supplement provides 425 kcals and 19 grams of protein.  - Recommend fluid restriction (1200 mL) - Continue Renavit - Promoted PO intake   NUTRITION DIAGNOSIS:   Increased nutrient needs related to chronic illness (ESRD, DM, HTN, anemia, CAD, SOB) as evidenced by estimated needs.  Ongoing  GOAL:   Patient will meet greater than or equal to 90% of their needs  Progressing  MONITOR:   PO intake, Supplement acceptance, Weight trends, I & O's, Labs  REASON FOR ASSESSMENT:   Malnutrition Screening Tool    ASSESSMENT:   Pt with PMH of HTN, COPD, anemia, and CAD presents with SOB d/t acute on chronic CHF, fluid overload in setting of ESRD on HD  Pt reports disliking the food taste and options while admitted. Meal completion records show pt is consuming approximately 75% of meals at this time, increased since last RD visit. Pt voiced concern regarding sugar levels while consuming Nepro, ProStat, and Solu-medrol at the same time. Discussed importance of adequate PO intake.   Labs reviewed; CBG 194-485, Na 134 Medications reviewed; Colace, sliding scale insulin, Lantus, Solu-medrol, Renavit, Renvela  Diet Order:  Diet regular Room service appropriate? Yes; Fluid consistency: Thin  Skin:  Reviewed, no issues  Last BM:  10/13/16  Height:   Ht Readings from Last 1 Encounters:  10/14/16 5\' 5"  (1.651 m)    Weight:   Wt Readings from Last 1 Encounters:  10/20/16 270 lb 15.1 oz (122.9 kg)    Ideal Body Weight:  57 kg  BMI:  Body mass index is 45.09 kg/m.  Estimated Nutritional Needs:   Kcal:  2000-2200 kcals  Protein:  110-125 grams of protein  Fluid:  1000 mL + fluid output  EDUCATION NEEDS:   Education needs addressed  Parks Ranger, MS, RDN,  LDN 10/20/2016 12:53 PM

## 2016-10-21 LAB — CBC WITH DIFFERENTIAL/PLATELET
Basophils Absolute: 0 10*3/uL (ref 0.0–0.1)
Basophils Relative: 0 %
EOS ABS: 0 10*3/uL (ref 0.0–0.7)
EOS PCT: 0 %
HCT: 29.8 % — ABNORMAL LOW (ref 36.0–46.0)
Hemoglobin: 9.6 g/dL — ABNORMAL LOW (ref 12.0–15.0)
LYMPHS ABS: 1.6 10*3/uL (ref 0.7–4.0)
LYMPHS PCT: 11 %
MCH: 28.2 pg (ref 26.0–34.0)
MCHC: 32.2 g/dL (ref 30.0–36.0)
MCV: 87.6 fL (ref 78.0–100.0)
MONO ABS: 0.7 10*3/uL (ref 0.1–1.0)
MONOS PCT: 5 %
Neutro Abs: 12 10*3/uL — ABNORMAL HIGH (ref 1.7–7.7)
Neutrophils Relative %: 84 %
PLATELETS: 304 10*3/uL (ref 150–400)
RBC: 3.4 MIL/uL — AB (ref 3.87–5.11)
RDW: 16.9 % — ABNORMAL HIGH (ref 11.5–15.5)
WBC: 14.3 10*3/uL — AB (ref 4.0–10.5)

## 2016-10-21 LAB — BASIC METABOLIC PANEL
Anion gap: 19 — ABNORMAL HIGH (ref 5–15)
BUN: 138 mg/dL — AB (ref 6–20)
CHLORIDE: 90 mmol/L — AB (ref 101–111)
CO2: 21 mmol/L — AB (ref 22–32)
CREATININE: 10.14 mg/dL — AB (ref 0.44–1.00)
Calcium: 9.3 mg/dL (ref 8.9–10.3)
GFR calc Af Amer: 4 mL/min — ABNORMAL LOW (ref >60)
GFR calc non Af Amer: 3 mL/min — ABNORMAL LOW (ref >60)
GLUCOSE: 531 mg/dL — AB (ref 65–99)
POTASSIUM: 4.9 mmol/L (ref 3.5–5.1)
SODIUM: 130 mmol/L — AB (ref 135–145)

## 2016-10-21 LAB — GLUCOSE, CAPILLARY
GLUCOSE-CAPILLARY: 472 mg/dL — AB (ref 65–99)
GLUCOSE-CAPILLARY: 315 mg/dL — AB (ref 65–99)
Glucose-Capillary: 443 mg/dL — ABNORMAL HIGH (ref 65–99)
Glucose-Capillary: 188 mg/dL — ABNORMAL HIGH (ref 65–99)
Glucose-Capillary: 292 mg/dL — ABNORMAL HIGH (ref 65–99)

## 2016-10-21 MED ORDER — PENTAFLUOROPROP-TETRAFLUOROETH EX AERO: 1.0000 "application " | INHALATION_SPRAY | CUTANEOUS | Status: DC | PRN

## 2016-10-21 MED ORDER — SODIUM CHLORIDE 0.9 % IV SOLN: 100.0000 mL | INTRAVENOUS | Status: DC | PRN

## 2016-10-21 MED ORDER — CALCITRIOL 0.25 MCG PO CAPS: 2.7500 ug | ORAL_CAPSULE | ORAL | 0 refills | Status: AC

## 2016-10-21 MED ORDER — INSULIN GLARGINE 100 UNIT/ML ~~LOC~~ SOLN: 10.0000 [IU] | Freq: Every day | SUBCUTANEOUS | 0 refills | Status: DC

## 2016-10-21 MED ORDER — LIDOCAINE HCL (PF) 1 % IJ SOLN: 5.0000 mL | INTRAMUSCULAR | Status: DC | PRN

## 2016-10-21 MED ORDER — MIDODRINE HCL 5 MG PO TABS
ORAL_TABLET | ORAL | Status: AC
  Filled 2016-10-21: qty 1

## 2016-10-21 MED ORDER — INSULIN ASPART 100 UNIT/ML ~~LOC~~ SOLN
12.0000 [IU] | Freq: Once | SUBCUTANEOUS | Status: AC
  Administered 2016-10-21: 12 [IU] via SUBCUTANEOUS

## 2016-10-21 MED ORDER — LIDOCAINE-PRILOCAINE 2.5-2.5 % EX CREA: 1.0000 "application " | TOPICAL_CREAM | CUTANEOUS | Status: DC | PRN

## 2016-10-21 MED ORDER — MIDODRINE HCL 5 MG PO TABS
ORAL_TABLET | ORAL | Status: AC
  Administered 2016-10-21: 5 mg via ORAL
  Filled 2016-10-21: qty 1

## 2016-10-21 MED ORDER — HEPARIN SODIUM (PORCINE) 1000 UNIT/ML DIALYSIS: 1000.0000 [IU] | INTRAMUSCULAR | Status: DC | PRN

## 2016-10-21 MED ORDER — TIOTROPIUM BROMIDE MONOHYDRATE 18 MCG IN CAPS: 18.0000 ug | ORAL_CAPSULE | Freq: Every day | RESPIRATORY_TRACT | 0 refills | Status: DC

## 2016-10-21 MED ORDER — PREDNISONE 10 MG PO TABS: ORAL_TABLET | ORAL | 0 refills | Status: DC

## 2016-10-21 MED ORDER — HEPARIN SODIUM (PORCINE) 1000 UNIT/ML DIALYSIS: 13000.0000 [IU] | Freq: Once | INTRAMUSCULAR | Status: DC

## 2016-10-21 MED ORDER — DOXYCYCLINE MONOHYDRATE 50 MG PO CAPS: 100.0000 mg | ORAL_CAPSULE | Freq: Two times a day (BID) | ORAL | 0 refills | Status: AC

## 2016-10-21 MED ORDER — ALBUTEROL SULFATE (2.5 MG/3ML) 0.083% IN NEBU: 2.5000 mg | INHALATION_SOLUTION | RESPIRATORY_TRACT | 0 refills | Status: DC | PRN

## 2016-10-21 NOTE — Care Management Note (Signed)
Case Management Note  Patient Details  Name: [name] MRN: [id number] Date of Birth: [birth year]  Subjective/Objective:      CM following for progression and d/c planning.              Action/Plan: 10/21/2016 Met with pt re [place] needs, pt active with [facility]. This CM notified [place] of plan to d/c this pt today. Per rep [facility] services will resume on 10/22/2016. Neb and home oxygen ordered from [facility], notified that agency and per rep Adonis Brook , DME will be delivered to room. This CM spoke with pt who states that her daughter will pick her up later this evening.   Expected Discharge Date:  10/21/16               Expected Discharge Plan:  [place]  In-House Referral:  NA  Discharge planning Services  CM Consult  Post Acute Care Choice:  Durable Medical Equipment, Home Health Choice offered to:  Patient  DME Arranged:  Nebulizer machine, Oxygen DME Agency:     HH Arranged:  RN, PT, OT, Nurse's Aide [place] Agency:  [place]  Status of Service:  Completed, signed off  If discussed at [place] of Stay Meetings, dates discussed:    Additional Comments:  Adron Bene, RN 10/21/2016, 3:25 PM

## 2016-10-21 NOTE — Progress Notes (Signed)
Dr. Aggie Moats notified of CBG-443; pt to leave for dialysis. Verbal order received for one time 12 units Novolog. Administered as directed. Assessment completed as documented. Off unit to dialysis @ 8:15am.

## 2016-10-21 NOTE — Discharge Summary (Signed)
Physician Discharge Summary  [name] EPP:[id number] DOB: 10-14-1945 DOA: 10/13/2016  PCP: [name], MD  Admit date: 10/13/2016 Discharge date: 10/21/2016  Time spent: 40 minutes  Recommendations for Outpatient Follow-up:  1. New pt appt with pulmonary & OP PFTS   Discharge Diagnoses:  Principal Problem:   Acute on chronic combined systolic and diastolic CHF (congestive heart failure) (HCC) Active Problems:   ESRD needing dialysis ([place])   Diabetes mellitus with complication (HCC)   Anemia due to chronic kidney disease   Acute respiratory failure ([place])   Discharge Condition: Stable  Diet recommendation: renal/DM  Filed Weights   10/20/16 2122 10/21/16 0848 10/21/16 1252  Weight: 122.9 kg (270 lb 15.1 oz) 128.3 kg (282 lb 13.6 oz) 125.7 kg (277 lb 1.9 oz)    History of present illness:   [name] is a 71 y.o. female with medical history significant of *diabetes, OSA, MI/CAD, ESRD, hypertension, anemia, hiatal hernia, right eye blindness.  Patient reports her primary complaint shortness of breath. Patient typically dialyzes on Tuesday Thursday and Saturday with last full session of dialysis on Saturday prior to admission, 10/11/2016. Patient presenting with shortness of breath that started the morning of admission upon waking up in the morning. Patient does report one day history of significant fatigue increasing fatigue and weakness after attending church. Denies any shortness breath or chest pain at that time. Patient states that during her dialysis session she had more fluid removed and her typical. Denies any medication changes, fevers, nausea, vomiting, abdominal pain, flank pain, neck stiffness, focal neurological deficit. Symptoms are worse with exertion  And when lying down flat. Denies any cough or wheezing.   Hospital Course:  Admitted with CHF. Treated with 3 rounds of HD. Pt less dyspneic but worse functionally. CT chest showed emphysema. VQ scan  low risk. Tx pt as COPD exacerbation with steroids, inhalers and doxy. She slowly made improvement. Later in stay she stated she had qualifed for O2 before and was supposed to be on it daily. Cardio consulted at pt req. ECHO did not show acute abn. Neg stress test. Qualified for home oxygen. PT/OT recommended HHPT/OT. D/c home.  Procedures: ECHO Study Conclusions  - Left ventricle: The cavity size was normal. Wall thickness was   increased in a pattern of mild LVH. Systolic function was normal.   The estimated ejection fraction was in the range of 55% to 60%.   Doppler parameters are consistent with both elevated ventricular   end-diastolic filling pressure and elevated left atrial filling   pressure. - Aortic valve: There was mild stenosis. Valve area (VTI): 1.62   cm^2. Valve area (Vmax): 1.71 cm^2. Valve area (Vmean): 1.68   cm^2. - Mitral valve: Severe MAC with mild functional MS. Severely   calcified annulus. Moderately thickened, moderately calcified   leaflets . Valve area by pressure half-time: 2 cm^2. Valve area   by continuity equation (using LVOT flow): 1.42 cm^2. - Left atrium: The atrium was mildly dilated. - Atrial septum: There was increased thickness of the septum,   consistent with lipomatous hypertrophy. - Pulmonary arteries: PA peak pressure: 32 mm Hg (S).  Consultations:  Cardio, Nephro  Discharge Exam: Vitals:   10/21/16 1252 10/21/16 1300  BP: (!) 114/56 111/60  Pulse: (!) 57 62  Resp: 16 17  Temp: 97.6 F (36.4 C) 97.8 F (36.6 C)  SpO2: 100% 100%    General: NAD, NCAT Cardiovascular: RRR, no MRG Respiratory: basilar crackles faint, nl WOB, 2L  [place]  Discharge Instructions    Discharge Medication List as of 10/21/2016  8:18 PM    START taking these medications   Details  calcitRIOL (ROCALTROL) 0.25 MCG capsule Take 11 capsules (2.75 mcg total) by mouth every Tuesday, Thursday, and Saturday at 6 PM., Starting Tue 10/21/2016, Until Fri 12/05/2016,  Normal    doxycycline (MONODOX) 50 MG capsule Take 2 capsules (100 mg total) by mouth 2 (two) times daily., Starting Tue 10/21/2016, Until Tue 10/28/2016, Normal    predniSONE (DELTASONE) 10 MG tablet Take four tabs for 2 days, three tabs for 3 days, two tabs for 3 days, one tabs for 3 days, Normal    tiotropium (SPIRIVA) 18 MCG inhalation capsule Place 1 capsule (18 mcg total) into inhaler and inhale daily., Starting Tue 10/21/2016, Normal      CONTINUE these medications which have CHANGED   Details  albuterol (PROVENTIL) (2.5 MG/3ML) 0.083% nebulizer solution Take 3 mLs (2.5 mg total) by nebulization every 2 (two) hours as needed for shortness of breath., Starting Tue 10/21/2016, Normal    insulin glargine (LANTUS) 100 UNIT/ML injection Inject 0.1 mLs (10 Units total) into the skin at bedtime., Starting Tue 10/21/2016, No Print      CONTINUE these medications which have NOT CHANGED   Details  albuterol (PROVENTIL HFA;VENTOLIN HFA) 108 (90 BASE) MCG/ACT inhaler Inhale 2 puffs into the lungs every 4 (four) hours as needed for wheezing or shortness of breath., Historical Med    aspirin EC 81 MG tablet Take 81 mg by mouth daily.  , Historical Med    atorvastatin (LIPITOR) 40 MG tablet Take 40 mg by mouth at bedtime. , Historical Med    bisacodyl (DULCOLAX) 5 MG EC tablet Take 10 mg by mouth daily as needed for moderate constipation. , Historical Med    Calcium Carbonate-Vitamin D (CALCIUM 600 + D PO) Take 1 tablet by mouth daily. , Historical Med    carvedilol (COREG) 25 MG tablet Take 25 mg by mouth See admin instructions. Take 1 tablet (25 mg) by mouth twice daily - 9am & bedtime (1am) on Monday, Wednesday, Friday, Sunday (non-dialysis days) and 1pm & bedtime (1am) on Tuesday, Thursday, Saturday (dialysis days), Starting Fri 9/7 /2018, Historical Med    cinacalcet (SENSIPAR) 90 MG tablet Take 90 mg by mouth See admin instructions. Take 1 tablet (90 mg) by mouth every morning on Monday,  Wednesday, Friday, Sunday and at 1pm on Tuesday, Thursday, Saturday (dialysis days), Historical Med    diphenhydrAMINE (BENADRYL) 25 mg capsule Take 50 mg by mouth See admin instructions. Take 2 tablets (50 mg) by mouth prior to dialysis on Tuesday, Thursday, Saturday - for itching, Historical Med    ethyl chloride spray Apply 1 application topically See admin instructions. Apply topically before dialysis on Tuesday, Thursday, Saturday, Starting Thu 01/10/2016, Historical Med    furosemide (LASIX) 40 MG tablet Take 40 mg by mouth daily., Starting Fri 10/03/2016, Historical Med    HYDROcodone-acetaminophen (NORCO/VICODIN) 5-325 MG tablet Take 1 tablet by mouth 2 (two) times daily as needed (knee pain). , Starting Sun 09/07/2016, Historical Med    !! midodrine (PROAMATINE) 10 MG tablet Take 10 mg by mouth See admin instructions. Take 1 tablet (10 mg) by mouth twice daily - 9am & bedtime (1am) on Monday, Wednesday, Friday, Sunday (non-dialysis days) and 5am & betw 7a and 8a on Tuesday, Thursday, Saturday (dialysis days), Starting Fri 9/ 07/2016, Historical Med    Multiple Vitamin (MULITIVITAMIN WITH MINERALS) TABS Take  1 tablet by mouth daily., Historical Med    sevelamer carbonate (RENVELA) 800 MG tablet Take 1 tablet (800 mg total) by mouth 3 (three) times daily with meals., Starting Tue 02/12/2016, No Print    !! midodrine (PROAMATINE) 5 MG tablet Take 1 tablet (5 mg total) by mouth 3 (three) times daily with meals., Starting Tue 02/12/2016, Normal     !! - Potential duplicate medications found. Please discuss with provider.    STOP taking these medications     docusate sodium (COLACE) 100 MG capsule        No Known Allergies    The results of significant diagnostics from this hospitalization (including imaging, microbiology, ancillary and laboratory) are listed below for reference.    Significant Diagnostic Studies: Dg Chest 2 View  Result Date: 10/16/2016 CLINICAL DATA:  Shortness of  breath with oxygen desaturation EXAM: CHEST  2 VIEW COMPARISON:  October 15, 2016 FINDINGS: There is cardiomegaly with pulmonary venous hypertension, stable. There is no appreciable edema or consolidation. There is aortic atherosclerosis. No evident adenopathy. There is degenerative change in the thoracic spine. IMPRESSION: Persistent pulmonary vascular congestion without frank edema or consolidation. There is aortic atherosclerosis. Aortic Atherosclerosis (ICD10-I70.0). Electronically Signed   By: Lowella Grip III M.D.   On: 10/16/2016 14:39   Ct Chest Wo Contrast  Result Date: 10/16/2016 CLINICAL DATA:  Shortness of breath for 4 days. Shortness breath for 4 days. History of CHF, diabetes, end-stage renal disease on dialysis. EXAM: CT CHEST WITHOUT CONTRAST TECHNIQUE: Multidetector CT imaging of the chest was performed following the standard protocol without IV contrast. COMPARISON:  Chest radiograph October 16, 2016 FINDINGS: CARDIOVASCULAR: The heart is mildly enlarged. Mitral annular calcifications. No pericardial effusion. Thoracic aorta is normal course and caliber, moderate calcific atherosclerosis. MEDIASTINUM/NODES: Enlarged LEFT thyroid lobe with coarse calcifications, substernal extent but no dominant nodule by noncontrast CT. No mediastinal mass. No lymphadenopathy by CT size criteria. Normal appearance of thoracic esophagus though not tailored for evaluation. LUNGS/PLEURA: Tracheobronchial tree is patent, no pneumothorax. LEFT basilar atelectasis. Mild centrilobular emphysema. Mild apical bullous changes. 2 mm RIGHT lower lobe pulmonary nodule, below size followup recommendation. Scattered calcified granulomas. UPPER ABDOMEN: Small hiatal hernia. Mildly dilated esophagus with air-fluid level. MUSCULOSKELETAL: Included soft tissues and included osseous structures are nonacute. RIGHT posterior chest wall varicosities. Cystic degenerative change bilateral glenoid. Mild degenerative change of  thoracic spine. IMPRESSION: 1. Mild cardiomegaly. 2. Mild centrilobular emphysema.  Dependent atelectasis. 3. LEFT thyromegaly with substernal extent, no dominant nodule. Aortic Atherosclerosis (ICD10-I70.0) and Emphysema (ICD10-J43.9). Electronically Signed   By: Elon Alas M.D.   On: 10/16/2016 21:14   Nm Myocar Multi W/spect W/wall Motion / Ef  Result Date: 10/20/2016 CLINICAL DATA:  Chest pain EXAM: MYOCARDIAL IMAGING WITH SPECT (REST AND PHARMACOLOGIC-STRESS) GATED LEFT VENTRICULAR WALL MOTION STUDY LEFT VENTRICULAR EJECTION FRACTION TECHNIQUE: Standard myocardial SPECT imaging was performed after resting intravenous injection of 10 mCi Tc-47mtetrofosmin. Subsequently, intravenous infusion of Lexiscan was performed under the supervision of the Cardiology staff. At peak effect of the drug, 30 mCi Tc-933metrofosmin was injected intravenously and standard myocardial SPECT imaging was performed. Quantitative gated imaging was also performed to evaluate left ventricular wall motion, and estimate left ventricular ejection fraction. COMPARISON:  None. FINDINGS: Perfusion: There is attenuation artifact in the inferior wall and apex. A small fixed perfusion defect at the apex cannot be excluded. There are no large fixed perfusion defects. There is no stress-induced ischemia. Wall Motion: Normal left ventricular wall  motion. No left ventricular dilation. Left Ventricular Ejection Fraction: 69 % End diastolic volume 409 ml End systolic volume 37 ml IMPRESSION: 1. There is attenuation artifact in the inferior wall. There may be a small fixed defect at the apex but there are no large perfusion defects and there is no stress-induced ischemia. 2. Normal left ventricular wall motion. 3. Left ventricular ejection fraction 69% 4. Non invasive risk stratification*: Low risk. *2012 Appropriate Use Criteria for Coronary Revascularization Focused Update: J Am Coll Cardiol. 8119;14(7):829-562.  http://content.airportbarriers.com.aspx?articleid=[email or web address] Electronically Signed   By: Marybelle Killings M.D.   On: 10/20/2016 15:28   Nm Pulmonary Perf And Vent  Result Date: 10/17/2016 CLINICAL DATA:  Shortness of breath post dialysis. EXAM: NUCLEAR MEDICINE VENTILATION - PERFUSION LUNG SCAN TECHNIQUE: Ventilation images were obtained in multiple projections using inhaled aerosol Tc-81mDTPA. Perfusion images were obtained in multiple projections after intravenous injection of Tc-9102mAA. RADIOPHARMACEUTICALS:  30 MCi Technetium-9952mPA aerosol inhalation and 4 mCi Technetium-36m21m IV COMPARISON:  CT 10/16/2016.  Chest x-ray 10/16/2016. FINDINGS: No ventilation perfusion defects noted suggest pulmonary embolus. Small ventilatory defects, larger than perfusion defects. IMPRESSION: Low probability pulmonary embolus. Electronically Signed   By: ThomMarcello Mooresgister   On: 10/17/2016 12:06   Dg Chest Port 1 View  Result Date: 10/15/2016 CLINICAL DATA:  Shortness of breath EXAM: PORTABLE CHEST 1 VIEW COMPARISON:  10/13/2016 FINDINGS: Mild cardiomegaly with central vascular congestion. No pleural effusion. Aortic atherosclerosis. No focal consolidation. IMPRESSION: Cardiomegaly with mild central vascular congestion Electronically Signed   By: Kim Donavan Foil.   On: 10/15/2016 17:25   Dg Chest Port 1 View  Result Date: 10/13/2016 CLINICAL DATA:  Shortness of breath. EXAM: PORTABLE CHEST 1 VIEW COMPARISON:  02/03/2016 FINDINGS: The cardio pericardial silhouette is enlarged. Pulmonary vascular congestion noted with diffuse interstitial prominence suggesting edema. The visualized bony structures of the thorax are intact. Telemetry leads overlie the chest. IMPRESSION: Cardiomegaly with vascular congestion and probable interstitial edema. Electronically Signed   By: EricMisty Stanley.   On: 10/13/2016 12:13    Microbiology: Recent Results (from the past 240 hour(s))  MRSA PCR Screening     Status: None    Collection Time: 10/14/16 12:02 AM  Result Value Ref Range Status   MRSA by PCR NEGATIVE NEGATIVE Final    Comment:        The GeneXpert MRSA Assay (FDA approved for NASAL specimens only), is one component of a comprehensive MRSA colonization surveillance program. It is not intended to diagnose MRSA infection nor to guide or monitor treatment for MRSA infections.      Labs: Basic Metabolic Panel:  Recent Labs Lab 10/17/16 0308 10/18/16 0553 10/18/16 2340 10/19/16 1850 10/21/16 0343  NA 135 131* 131* 134* 130*  K 4.1 4.4 3.8 4.3 4.9  CL 96* 91* 92* 93* 90*  CO2 26 25 24 27  21*  GLUCOSE 201* 334* 520* 291* 531*  BUN 49* 94* 48* 82* 138*  CREATININE 6.15* 8.90* 5.60* 7.33* 10.14*  CALCIUM 9.6 10.4* 9.8 10.1 9.3  PHOS  --   --   --  4.6  --    Liver Function Tests:  Recent Labs Lab 10/19/16 1850  ALBUMIN 2.9*   No results for input(s): LIPASE, AMYLASE in the last 168 hours. No results for input(s): AMMONIA in the last 168 hours. CBC:  Recent Labs Lab 10/17/16 0308 10/18/16 0553 10/18/16 2347 10/19/16 1345 10/21/16 0343  WBC 9.4 13.9* 13.8* 17.8* 14.3*  NEUTROABS 5.6 11.8*  12.2* 14.8* 12.0*  HGB 8.6* 8.6* 8.8* 9.8* 9.6*  HCT 28.6* 27.3* 28.3* 29.6* 29.8*  MCV 91.1 88.6 90.1 88.1 87.6  PLT 211 256 256 278 304   Cardiac Enzymes: No results for input(s): CKTOTAL, CKMB, CKMBINDEX, TROPONINI in the last 168 hours. BNP: BNP (last 3 results)  Recent Labs  01/30/16 1848 02/08/16 0602 10/13/16 1146  BNP 1,568.3* 119.1* 533.8*    ProBNP (last 3 results) No results for input(s): PROBNP in the last 8760 hours.  CBG:  Recent Labs Lab 10/20/16 1642 10/20/16 2100 10/21/16 0540 10/21/16 0806 10/21/16 1417  GLUCAP 256* 354* 472* 443* 188*       Signed:  Elwin Mocha MD  FACP  [facility] 10/21/2016, 2:48 PM

## 2016-10-21 NOTE — Progress Notes (Signed)
Discharge paper and instructions given to patient. Patient is made aware of follow up appointment with MD and changes with medications including new medicines. Patient to pick up prescription from her pharmacy. Patient verbalized understanding,questions answered.Patient awaiting niece to pick her up. Joyceann Kruser, Wonda Cheng, Therapist, sports

## 2016-10-21 NOTE — Progress Notes (Signed)
Patient discharged home,accompanied by daughter All belongings and equipments sent with patient [name], Wonda Cheng, RN

## 2016-10-21 NOTE — Progress Notes (Signed)
RN unable to print D/C papers because d/c order reconciliation is not complete.Text paged on call MD.. Morgen Ritacco, Wonda Cheng, Therapist, sports

## 2016-10-21 NOTE — Progress Notes (Signed)
CRITICAL VALUE ALERT  Critical Value:  Serum glucose=531  Date & Time Notied:  10/21/16 @0506   Provider Notified: Maryjane Hurter @0 :547 and 06:57  Orders Received/Actions taken: No call/text/order received.

## 2016-10-21 NOTE — Progress Notes (Addendum)
Inpatient Diabetes Program Recommendations  AACE/ADA: New Consensus Statement on Inpatient Glycemic Control (2015)  Target Ranges:  Prepandial:   less than 140 mg/dL      Peak postprandial:   less than 180 mg/dL (1-2 hours)      Critically ill patients:  140 - 180 mg/dL   Lab Results  Component Value Date   GLUCAP 443 (H) 10/21/2016   HGBA1C 6.1 (H) 10/13/2016    Review of Glycemic Control   Results for ASYRIA, KOLANDER (MRN [id number]) as of 10/21/2016 12:47  Ref. Range 10/20/2016 12:03 10/20/2016 16:42 10/20/2016 21:00 10/21/2016 05:40 10/21/2016 08:06  Glucose-Capillary Latest Ref Range: 65 - 99 mg/dL 257 (H) 256 (H) 354 (H) 472 (H) 443 (H)   Diabetes history: DM2 Outpatient Diabetes medications: Lantus 10 units qhs, Novolog 0-9 units tid, Novolog 0-5 units qhs  * IV solu-medrol 4 times per day Current orders for Inpatient glycemic control: Lantus 10 units,  Novolog correction sensitive scale 0-15 units tid, Novolog 0-5 units hs  Inpatient Diabetes Program Recommendations:  While on high doses of steroids, consider increasing Novolog to moderate correction scale 0-15 units tid (please ensure the insulin is given at least 4 hours apart).   Increase Lantus to 26 units (0.2units/kg) qhs while on steroids.  Gentry Fitz, RN, BA, MHA, CDE Diabetes Coordinator Inpatient Diabetes Program  [phone] (Team Pager) [phone] ([place]) 10/21/2016 12:55 PM

## 2016-10-21 NOTE — Progress Notes (Signed)
[facility] Progress Note  Subjective: on HD, tolerating UF so far  Vitals:   10/21/16 0945 10/21/16 1015 10/21/16 1045 10/21/16 1100  BP: 114/64 103/64 97/64 98/61   Pulse: (!) 54 61 (!) 59 (!) 59  Resp:      Temp:      TempSrc:      SpO2:      Weight:      Height:        Inpatient medications: . albuterol  2.5 mg Nebulization BID  . aspirin EC  81 mg Oral Daily  . atorvastatin  40 mg Oral Daily  . calcitRIOL  2.75 mcg Oral Q T,Th,Sat-1800  . cinacalcet  90 mg Oral Q breakfast  . darbepoetin (ARANESP) injection - DIALYSIS  100 mcg Intravenous Q Sat-HD  . docusate sodium  100 mg Oral Daily  . doxycycline  100 mg Oral Q12H  . feeding supplement (NEPRO CARB STEADY)  237 mL Oral Q supper  . feeding supplement (PRO-STAT SUGAR FREE 64)  30 mL Oral TID  . [START ON 10/22/2016] heparin  13,000 Units Dialysis Once in dialysis  . insulin aspart  0-5 Units Subcutaneous QHS  . insulin aspart  0-9 Units Subcutaneous TID WC  . insulin glargine  10 Units Subcutaneous QHS  . mouth rinse  15 mL Mouth Rinse BID  . methylPREDNISolone (SOLU-MEDROL) injection  60 mg Intravenous QID  . midodrine  5 mg Oral TID WC  . multivitamin  1 tablet Oral QHS  . sevelamer carbonate  800 mg Oral TID WC  . tiotropium  18 mcg Inhalation Daily   . sodium chloride    . sodium chloride    . sodium chloride    . sodium chloride     sodium chloride, sodium chloride, acetaminophen **OR** acetaminophen, albuterol, bisacodyl, heparin, lidocaine (PF), lidocaine-prilocaine, ondansetron **OR** ondansetron (ZOFRAN) IV, pentafluoroprop-tetrafluoroeth  Exam: Calm, no distress No jvd Chest some exp wheezing, no rales RRR no mrg Abd obese soft ntnd Ext 1+ edema NF, Ox 3 LUA AVF patent  Dialysis: [facility] 4.5h   2/2  124.5 kg  LUA AVF  Hep 13000 Mircera 161mcg IV q2wks - recently lowered, last 147mcg on 9/22 Calcitriol 2.24mcg PO q HD  Last labs - 9/18: Hgb 8.5, Ca 9.6, P 3.9, K 5.3. 8/23: TSAT  31%, Albumin 3.8 PTH 245      Impression: 1.  Dyspnea - not sure cause.  CT w/o any lung edema, small lung volumes from obesity.  Does have some emphysema by CT.  Cardiac stress was negative.  Will plan lowering volume as tolerated today with HD.   2.  ESRD on HD TTS 3.  Hypotension - on midodrine tid 4.  Vol - dry wt 124kg, challenge today on HD 5.  IDDM 6.  MBD stable 7.  Anemia - cont esa 100 q wk  Plan - HD today, as above   Kelly Splinter MD [facility] pager [phone]   10/21/2016, 11:08 AM    Recent Labs Lab 10/18/16 2340 10/19/16 1850 10/21/16 0343  NA 131* 134* 130*  K 3.8 4.3 4.9  CL 92* 93* 90*  CO2 24 27 21*  GLUCOSE 520* 291* 531*  BUN 48* 82* 138*  CREATININE 5.60* 7.33* 10.14*  CALCIUM 9.8 10.1 9.3  PHOS  --  4.6  --     Recent Labs Lab 10/19/16 1850  ALBUMIN 2.9*    Recent Labs Lab 10/18/16 2347 10/19/16 1345 10/21/16 0343  WBC  13.8* 17.8* 14.3*  NEUTROABS 12.2* 14.8* 12.0*  HGB 8.8* 9.8* 9.6*  HCT 28.3* 29.6* 29.8*  MCV 90.1 88.1 87.6  PLT 256 278 304   Iron/TIBC/Ferritin/ %Sat    Component Value Date/Time   IRON 185 (H) 02/07/2016 1137   TIBC 272 02/07/2016 1137   FERRITIN 2,583 (H) 02/07/2016 1137   IRONPCTSAT 68 (H) 02/07/2016 1137

## 2016-10-22 DIAGNOSIS — E782 Mixed hyperlipidemia: Secondary | ICD-10-CM | POA: Diagnosis not present

## 2016-10-23 ENCOUNTER — Other Ambulatory Visit: Payer: Self-pay | Admitting: *Deleted

## 2016-10-23 DIAGNOSIS — I132 Hypertensive heart and chronic kidney disease with heart failure and with stage 5 chronic kidney disease, or end stage renal disease: Secondary | ICD-10-CM | POA: Diagnosis not present

## 2016-10-23 DIAGNOSIS — E119 Type 2 diabetes mellitus without complications: Secondary | ICD-10-CM | POA: Diagnosis not present

## 2016-10-23 DIAGNOSIS — E875 Hyperkalemia: Secondary | ICD-10-CM | POA: Diagnosis not present

## 2016-10-23 DIAGNOSIS — E1129 Type 2 diabetes mellitus with other diabetic kidney complication: Secondary | ICD-10-CM | POA: Diagnosis not present

## 2016-10-23 DIAGNOSIS — D689 Coagulation defect, unspecified: Secondary | ICD-10-CM | POA: Diagnosis not present

## 2016-10-23 DIAGNOSIS — D631 Anemia in chronic kidney disease: Secondary | ICD-10-CM | POA: Diagnosis not present

## 2016-10-23 DIAGNOSIS — H5461 Unqualified visual loss, right eye, normal vision left eye: Secondary | ICD-10-CM | POA: Diagnosis not present

## 2016-10-23 DIAGNOSIS — N186 End stage renal disease: Secondary | ICD-10-CM | POA: Diagnosis not present

## 2016-10-23 DIAGNOSIS — N2581 Secondary hyperparathyroidism of renal origin: Secondary | ICD-10-CM | POA: Diagnosis not present

## 2016-10-23 DIAGNOSIS — M1991 Primary osteoarthritis, unspecified site: Secondary | ICD-10-CM | POA: Diagnosis not present

## 2016-10-23 DIAGNOSIS — E782 Mixed hyperlipidemia: Secondary | ICD-10-CM | POA: Diagnosis not present

## 2016-10-23 DIAGNOSIS — I5041 Acute combined systolic (congestive) and diastolic (congestive) heart failure: Secondary | ICD-10-CM | POA: Diagnosis not present

## 2016-10-23 DIAGNOSIS — I2510 Atherosclerotic heart disease of native coronary artery without angina pectoris: Secondary | ICD-10-CM | POA: Diagnosis not present

## 2016-10-23 DIAGNOSIS — E1122 Type 2 diabetes mellitus with diabetic chronic kidney disease: Secondary | ICD-10-CM | POA: Diagnosis not present

## 2016-10-23 NOTE — Patient Outreach (Signed)
[facility]) Care Management  10/23/2016  [name] 05-06-1945 [id number]  Transition of Care Referral  Referral Date: 10/23/16 Referral Source: Jordan Hawks, RN [facility]) Date of Discharge: 10/21/16 Facility: [facility] Discharge Diagnosis: Heart Failure Insurance:   Outreach attempt to patient. HIPAA identifiers verified with patient. Patient rated her health as poor and she is independent/assist with ADLs. She was returning home from hemodialysis. She stated, she was discharged from the hospital on 10/22/16. "She doesn't know why she was in the hospital, although she had symptoms of shortness of breath. Per EMR, patient was diagnosed with acute on chronic combined systolic and diastolic CHF. Per patient, "a Cardiologist within the hospital stated there wasn't anything wrong with her heart". Patient has a primary MD appointment scheduled for next week. She plans to ask the doctor about her recent hospital stay. Patient stated, she was discharged home on oxygen at 2 liters. Patient reported, she received her hospital discharge paperwork and she understood it. She did not receive a heart failure packet during her hospital stay. Patient takes her medications as prescribed by the doctor, including Albuterol, Spiriva, and Lasix. Patient stated, she weighs daily and her last recorded weight was 277 pounds. She has a history of Diabetes and her blood glucose reading was 220. Her blood pressure reading was in the low 100's over 60's.   Patient reported, she has a personal care aide for 2 hours per day for 5 days per week with [facility]. Per patient, she was told she would have home health PT/OT, however she hasn't been contacted by a home health agency.    Plan: RN CM will send referral to [facility] RN for further in home eval/assessment of care needs and management of chronic conditions. RN CM will send referral to [place] for transition of care. RN CM  advised patient that [facility] community RN CM would follow up and contact patient within the next 10 days. RN CM advised patient to contact RNCM for any needs or concerns.   Lake Bells, RN, BSN, MHA/MSL, [place] Telephonic Care Manager Coordinator [facility] Direct Phone: [phone] Toll Free: [phone] Fax: [phone]

## 2016-10-24 DIAGNOSIS — M1991 Primary osteoarthritis, unspecified site: Secondary | ICD-10-CM | POA: Diagnosis not present

## 2016-10-24 DIAGNOSIS — H5461 Unqualified visual loss, right eye, normal vision left eye: Secondary | ICD-10-CM | POA: Diagnosis not present

## 2016-10-24 DIAGNOSIS — D631 Anemia in chronic kidney disease: Secondary | ICD-10-CM | POA: Diagnosis not present

## 2016-10-24 DIAGNOSIS — E1122 Type 2 diabetes mellitus with diabetic chronic kidney disease: Secondary | ICD-10-CM | POA: Diagnosis not present

## 2016-10-24 DIAGNOSIS — E782 Mixed hyperlipidemia: Secondary | ICD-10-CM | POA: Diagnosis not present

## 2016-10-24 DIAGNOSIS — I5041 Acute combined systolic (congestive) and diastolic (congestive) heart failure: Secondary | ICD-10-CM | POA: Diagnosis not present

## 2016-10-24 DIAGNOSIS — I132 Hypertensive heart and chronic kidney disease with heart failure and with stage 5 chronic kidney disease, or end stage renal disease: Secondary | ICD-10-CM | POA: Diagnosis not present

## 2016-10-24 DIAGNOSIS — I2510 Atherosclerotic heart disease of native coronary artery without angina pectoris: Secondary | ICD-10-CM | POA: Diagnosis not present

## 2016-10-24 DIAGNOSIS — N186 End stage renal disease: Secondary | ICD-10-CM | POA: Diagnosis not present

## 2016-10-25 DIAGNOSIS — N186 End stage renal disease: Secondary | ICD-10-CM | POA: Diagnosis not present

## 2016-10-25 DIAGNOSIS — D631 Anemia in chronic kidney disease: Secondary | ICD-10-CM | POA: Diagnosis not present

## 2016-10-25 DIAGNOSIS — E782 Mixed hyperlipidemia: Secondary | ICD-10-CM | POA: Diagnosis not present

## 2016-10-25 DIAGNOSIS — E119 Type 2 diabetes mellitus without complications: Secondary | ICD-10-CM | POA: Diagnosis not present

## 2016-10-25 DIAGNOSIS — E1129 Type 2 diabetes mellitus with other diabetic kidney complication: Secondary | ICD-10-CM | POA: Diagnosis not present

## 2016-10-25 DIAGNOSIS — D689 Coagulation defect, unspecified: Secondary | ICD-10-CM | POA: Diagnosis not present

## 2016-10-25 DIAGNOSIS — N2581 Secondary hyperparathyroidism of renal origin: Secondary | ICD-10-CM | POA: Diagnosis not present

## 2016-10-25 DIAGNOSIS — E875 Hyperkalemia: Secondary | ICD-10-CM | POA: Diagnosis not present

## 2016-10-26 DIAGNOSIS — E782 Mixed hyperlipidemia: Secondary | ICD-10-CM | POA: Diagnosis not present

## 2016-10-27 ENCOUNTER — Other Ambulatory Visit: Payer: Self-pay

## 2016-10-27 ENCOUNTER — Other Ambulatory Visit (HOSPITAL_COMMUNITY): Payer: Self-pay | Admitting: Respiratory Therapy

## 2016-10-27 DIAGNOSIS — Z6841 Body Mass Index (BMI) 40.0 and over, adult: Secondary | ICD-10-CM | POA: Diagnosis not present

## 2016-10-27 DIAGNOSIS — D6489 Other specified anemias: Secondary | ICD-10-CM | POA: Diagnosis not present

## 2016-10-27 DIAGNOSIS — J441 Chronic obstructive pulmonary disease with (acute) exacerbation: Secondary | ICD-10-CM

## 2016-10-27 DIAGNOSIS — I509 Heart failure, unspecified: Secondary | ICD-10-CM | POA: Diagnosis not present

## 2016-10-27 DIAGNOSIS — E1140 Type 2 diabetes mellitus with diabetic neuropathy, unspecified: Secondary | ICD-10-CM | POA: Diagnosis not present

## 2016-10-27 DIAGNOSIS — J9600 Acute respiratory failure, unspecified whether with hypoxia or hypercapnia: Secondary | ICD-10-CM | POA: Diagnosis not present

## 2016-10-27 DIAGNOSIS — J449 Chronic obstructive pulmonary disease, unspecified: Secondary | ICD-10-CM | POA: Diagnosis not present

## 2016-10-27 DIAGNOSIS — N186 End stage renal disease: Secondary | ICD-10-CM | POA: Diagnosis not present

## 2016-10-27 DIAGNOSIS — E782 Mixed hyperlipidemia: Secondary | ICD-10-CM | POA: Diagnosis not present

## 2016-10-27 NOTE — Patient Outreach (Signed)
[name] [facility]) Care Management  10/27/2016  [name] 1945-08-22 [id number]   Subjective: client reports she has been weighing self daily, she reports having difficulty with working the portable tanks.  Objective: none  Assessment: 71 year old with recent admission 9/24-10/2 for heart failure. History of ESRD/HD, diabetes, COPD.   Client reports she is active with [facility] home care nursing and physical therapy. She reports she has home oxygen, but is having difficulty setting up other tanks to be able to go into a different room.   Client had follow up appointment with primary care. She reports no changes, but states her insulin was increased to 20 units due to increased blood sugar while taking Prednisone.  Medications reviewed-client reports she is not supposed to take Lasix, stating is was discontinued by her doctor previously.   Client reports weight today 271.8 pounds and 270.0 on yesterday. She reports she goes to dialysis tomorrow.  Plan: RNCM will follow [facility] with [facility] for [facility] needs. RNCM will make [place] referral for medication reconciliation. Home visit next week.  [name], RN, MSN, [place] Coordinator Cell: [phone]

## 2016-10-27 NOTE — Addendum Note (Signed)
Addended by: Luretha Rued on: 10/27/2016 04:18 PM   Modules accepted: Orders

## 2016-10-27 NOTE — Patient Outreach (Signed)
[facility]) Care Management  10/27/2016  [name] 1945/11/14 [id number]   Care Coordination: RNCM called [facility] and spoke with Rona Ravens who reports that client called into the office regarding oxygen tank on Friday and they attempted to call her, but was not able to get through. She reports they will be calling her within the next 2 hours.   RNCM called client to inform her that she should be receiving a call within the next 2 hours from [facility]. Client to call RNCM if she does not hear from them or as needed.  Plan: home visit next week.  [name], RN, MSN, [place] Coordinator Cell: [phone]

## 2016-10-28 ENCOUNTER — Telehealth: Payer: Self-pay | Admitting: Pharmacist

## 2016-10-28 DIAGNOSIS — N2581 Secondary hyperparathyroidism of renal origin: Secondary | ICD-10-CM | POA: Diagnosis not present

## 2016-10-28 DIAGNOSIS — E1129 Type 2 diabetes mellitus with other diabetic kidney complication: Secondary | ICD-10-CM | POA: Diagnosis not present

## 2016-10-28 DIAGNOSIS — E875 Hyperkalemia: Secondary | ICD-10-CM | POA: Diagnosis not present

## 2016-10-28 DIAGNOSIS — D631 Anemia in chronic kidney disease: Secondary | ICD-10-CM | POA: Diagnosis not present

## 2016-10-28 DIAGNOSIS — I2510 Atherosclerotic heart disease of native coronary artery without angina pectoris: Secondary | ICD-10-CM | POA: Diagnosis not present

## 2016-10-28 DIAGNOSIS — N186 End stage renal disease: Secondary | ICD-10-CM | POA: Diagnosis not present

## 2016-10-28 DIAGNOSIS — I5041 Acute combined systolic (congestive) and diastolic (congestive) heart failure: Secondary | ICD-10-CM | POA: Diagnosis not present

## 2016-10-28 DIAGNOSIS — E119 Type 2 diabetes mellitus without complications: Secondary | ICD-10-CM | POA: Diagnosis not present

## 2016-10-28 DIAGNOSIS — H5461 Unqualified visual loss, right eye, normal vision left eye: Secondary | ICD-10-CM | POA: Diagnosis not present

## 2016-10-28 DIAGNOSIS — M1991 Primary osteoarthritis, unspecified site: Secondary | ICD-10-CM | POA: Diagnosis not present

## 2016-10-28 DIAGNOSIS — E1122 Type 2 diabetes mellitus with diabetic chronic kidney disease: Secondary | ICD-10-CM | POA: Diagnosis not present

## 2016-10-28 DIAGNOSIS — D689 Coagulation defect, unspecified: Secondary | ICD-10-CM | POA: Diagnosis not present

## 2016-10-28 DIAGNOSIS — I132 Hypertensive heart and chronic kidney disease with heart failure and with stage 5 chronic kidney disease, or end stage renal disease: Secondary | ICD-10-CM | POA: Diagnosis not present

## 2016-10-28 DIAGNOSIS — E782 Mixed hyperlipidemia: Secondary | ICD-10-CM | POA: Diagnosis not present

## 2016-10-28 NOTE — Patient Outreach (Signed)
71 y.o. year old female referred to [facility] for Medication Management   Was unable to reach patient via telephone today and was unable to leave voicemail as mail-box was full (unsuccessful outreach #1).   Patient has two providers that do not document on Epic.   Plan: Will followup within 5 business days via telephone with patient Left message with CMA for Dr. Ardeth Perfect requesting faxed medication list Spoke with [facility] and requested faxed medication list  Carlean Jews, Pharm.D. PGY2 Ambulatory Care Pharmacy Resident Phone: [phone]

## 2016-10-29 DIAGNOSIS — E782 Mixed hyperlipidemia: Secondary | ICD-10-CM | POA: Diagnosis not present

## 2016-10-30 DIAGNOSIS — D689 Coagulation defect, unspecified: Secondary | ICD-10-CM | POA: Diagnosis not present

## 2016-10-30 DIAGNOSIS — E1129 Type 2 diabetes mellitus with other diabetic kidney complication: Secondary | ICD-10-CM | POA: Diagnosis not present

## 2016-10-30 DIAGNOSIS — E875 Hyperkalemia: Secondary | ICD-10-CM | POA: Diagnosis not present

## 2016-10-30 DIAGNOSIS — D631 Anemia in chronic kidney disease: Secondary | ICD-10-CM | POA: Diagnosis not present

## 2016-10-30 DIAGNOSIS — N186 End stage renal disease: Secondary | ICD-10-CM | POA: Diagnosis not present

## 2016-10-30 DIAGNOSIS — E782 Mixed hyperlipidemia: Secondary | ICD-10-CM | POA: Diagnosis not present

## 2016-10-30 DIAGNOSIS — E119 Type 2 diabetes mellitus without complications: Secondary | ICD-10-CM | POA: Diagnosis not present

## 2016-10-30 DIAGNOSIS — N2581 Secondary hyperparathyroidism of renal origin: Secondary | ICD-10-CM | POA: Diagnosis not present

## 2016-10-31 ENCOUNTER — Ambulatory Visit (HOSPITAL_COMMUNITY)
Admission: RE | Admit: 2016-10-31 | Discharge: 2016-10-31 | Disposition: A | Discharge status: Home / Self Care | Payer: Medicare HMO | Source: Ambulatory Visit | Attending: Internal Medicine | Admitting: Internal Medicine

## 2016-10-31 ENCOUNTER — Telehealth: Payer: Self-pay | Admitting: Pharmacist

## 2016-10-31 DIAGNOSIS — N186 End stage renal disease: Secondary | ICD-10-CM | POA: Diagnosis not present

## 2016-10-31 DIAGNOSIS — I5041 Acute combined systolic (congestive) and diastolic (congestive) heart failure: Secondary | ICD-10-CM | POA: Diagnosis not present

## 2016-10-31 DIAGNOSIS — I132 Hypertensive heart and chronic kidney disease with heart failure and with stage 5 chronic kidney disease, or end stage renal disease: Secondary | ICD-10-CM | POA: Diagnosis not present

## 2016-10-31 DIAGNOSIS — M1991 Primary osteoarthritis, unspecified site: Secondary | ICD-10-CM | POA: Diagnosis not present

## 2016-10-31 DIAGNOSIS — H5461 Unqualified visual loss, right eye, normal vision left eye: Secondary | ICD-10-CM | POA: Diagnosis not present

## 2016-10-31 DIAGNOSIS — J441 Chronic obstructive pulmonary disease with (acute) exacerbation: Secondary | ICD-10-CM | POA: Diagnosis not present

## 2016-10-31 DIAGNOSIS — I2510 Atherosclerotic heart disease of native coronary artery without angina pectoris: Secondary | ICD-10-CM | POA: Diagnosis not present

## 2016-10-31 DIAGNOSIS — E1122 Type 2 diabetes mellitus with diabetic chronic kidney disease: Secondary | ICD-10-CM | POA: Diagnosis not present

## 2016-10-31 DIAGNOSIS — E782 Mixed hyperlipidemia: Secondary | ICD-10-CM | POA: Diagnosis not present

## 2016-10-31 DIAGNOSIS — D631 Anemia in chronic kidney disease: Secondary | ICD-10-CM | POA: Diagnosis not present

## 2016-10-31 MED ORDER — ALBUTEROL SULFATE (2.5 MG/3ML) 0.083% IN NEBU
2.5000 mg | INHALATION_SOLUTION | Freq: Once | RESPIRATORY_TRACT | Status: AC
  Administered 2016-10-31: 2.5 mg via RESPIRATORY_TRACT

## 2016-10-31 NOTE — Patient Outreach (Addendum)
[facility]) Care Management  10/31/2016  [name] 11-29-1945 [id number]   71 y.o. year old female referred to [place] for Medication Management [facility] Telephone outreach f/u)  71 y.o. year old female referred to [place] for Medication Management [facility] Telephone outreach f/u)   Was unable to reach patient via telephone today and have left HIPAA compliant voicemail asking patient to return my call (unsuccessful outreach #2).  Reached out to other providers and obtained medication lists. Called [facility] pharmacy as well, they provide fill hx and medication list has been updated accordingly. Many discrepancies noted. Sevelamer on medication list from dialysis center however [facility] has never filled. Midodrine Rx requires clarification as med list indicated patient was to take at bedtime which is likely not how rx was intended to be taken given risk of hypertension when supine. Per [facility], prior authorization required for calcitriol, which was sent to Dr. Aggie Moats who saw patient on the inpatient side during last admission. Pharmacy also has direction for lantus : 20 units daily, however at last discharge, patient was ordered 10 units daily. Will need to clarify with patient.   Plan: Will followup in 3 days via telephone with patient Sent inbasket message to Dr. Joelyn Oms and Dr. Aggie Moats requesting Dr. Joelyn Oms to send new Rx for calcitriol so prior authorization may be directed to him.  Sent inbasket message to Dr. Joelyn Oms requesting clarification on midodrine prescription and other renal orders.    Carlean Jews, Pharm.D. PGY2 Ambulatory Care Pharmacy Resident Phone: [phone]

## 2016-11-01 DIAGNOSIS — D631 Anemia in chronic kidney disease: Secondary | ICD-10-CM | POA: Diagnosis not present

## 2016-11-01 DIAGNOSIS — N186 End stage renal disease: Secondary | ICD-10-CM | POA: Diagnosis not present

## 2016-11-01 DIAGNOSIS — E1129 Type 2 diabetes mellitus with other diabetic kidney complication: Secondary | ICD-10-CM | POA: Diagnosis not present

## 2016-11-01 DIAGNOSIS — E875 Hyperkalemia: Secondary | ICD-10-CM | POA: Diagnosis not present

## 2016-11-01 DIAGNOSIS — E782 Mixed hyperlipidemia: Secondary | ICD-10-CM | POA: Diagnosis not present

## 2016-11-01 DIAGNOSIS — E119 Type 2 diabetes mellitus without complications: Secondary | ICD-10-CM | POA: Diagnosis not present

## 2016-11-01 DIAGNOSIS — D689 Coagulation defect, unspecified: Secondary | ICD-10-CM | POA: Diagnosis not present

## 2016-11-01 DIAGNOSIS — N2581 Secondary hyperparathyroidism of renal origin: Secondary | ICD-10-CM | POA: Diagnosis not present

## 2016-11-02 DIAGNOSIS — E782 Mixed hyperlipidemia: Secondary | ICD-10-CM | POA: Diagnosis not present

## 2016-11-03 DIAGNOSIS — E782 Mixed hyperlipidemia: Secondary | ICD-10-CM | POA: Diagnosis not present

## 2016-11-03 LAB — PULMONARY FUNCTION TEST
DL/VA % PRED: 92 %
DL/VA: 4.54 ml/min/mmHg/L
DLCO COR % PRED: 61 %
DLCO COR: 15.78 ml/min/mmHg
DLCO unc % pred: 52 %
DLCO unc: 13.56 ml/min/mmHg
FEF 25-75 POST: 2.52 L/s
FEF 25-75 Pre: 2.82 L/sec
FEF2575-%CHANGE-POST: -10 %
FEF2575-%PRED-PRE: 169 %
FEF2575-%Pred-Post: 151 %
FEV1-%Change-Post: -1 %
FEV1-%Pred-Post: 98 %
FEV1-%Pred-Pre: 99 %
FEV1-POST: 1.82 L
FEV1-Pre: 1.85 L
FEV1FVC-%Change-Post: 1 %
FEV1FVC-%Pred-Pre: 114 %
FEV6-%CHANGE-POST: -3 %
FEV6-%Pred-Post: 88 %
FEV6-%Pred-Pre: 90 %
FEV6-Post: 2.03 L
FEV6-Pre: 2.09 L
FEV6FVC-%Pred-Post: 104 %
FEV6FVC-%Pred-Pre: 104 %
FVC-%Change-Post: -3 %
FVC-%PRED-POST: 84 %
FVC-%Pred-Pre: 87 %
FVC-PRE: 2.09 L
FVC-Post: 2.03 L
POST FEV1/FVC RATIO: 90 %
PRE FEV1/FVC RATIO: 88 %
Post FEV6/FVC ratio: 100 %
Pre FEV6/FVC Ratio: 100 %
RV % pred: 78 %
RV: 1.79 L
TLC % PRED: 80 %
TLC: 4.17 L

## 2016-11-04 DIAGNOSIS — N186 End stage renal disease: Secondary | ICD-10-CM | POA: Diagnosis not present

## 2016-11-04 DIAGNOSIS — E119 Type 2 diabetes mellitus without complications: Secondary | ICD-10-CM | POA: Diagnosis not present

## 2016-11-04 DIAGNOSIS — E782 Mixed hyperlipidemia: Secondary | ICD-10-CM | POA: Diagnosis not present

## 2016-11-04 DIAGNOSIS — D689 Coagulation defect, unspecified: Secondary | ICD-10-CM | POA: Diagnosis not present

## 2016-11-04 DIAGNOSIS — N2581 Secondary hyperparathyroidism of renal origin: Secondary | ICD-10-CM | POA: Diagnosis not present

## 2016-11-04 DIAGNOSIS — E875 Hyperkalemia: Secondary | ICD-10-CM | POA: Diagnosis not present

## 2016-11-04 DIAGNOSIS — D631 Anemia in chronic kidney disease: Secondary | ICD-10-CM | POA: Diagnosis not present

## 2016-11-04 DIAGNOSIS — E1129 Type 2 diabetes mellitus with other diabetic kidney complication: Secondary | ICD-10-CM | POA: Diagnosis not present

## 2016-11-05 ENCOUNTER — Other Ambulatory Visit: Payer: Self-pay

## 2016-11-05 ENCOUNTER — Telehealth: Payer: Self-pay | Admitting: Pharmacist

## 2016-11-05 DIAGNOSIS — D631 Anemia in chronic kidney disease: Secondary | ICD-10-CM | POA: Diagnosis not present

## 2016-11-05 DIAGNOSIS — I132 Hypertensive heart and chronic kidney disease with heart failure and with stage 5 chronic kidney disease, or end stage renal disease: Secondary | ICD-10-CM | POA: Diagnosis not present

## 2016-11-05 DIAGNOSIS — E1122 Type 2 diabetes mellitus with diabetic chronic kidney disease: Secondary | ICD-10-CM | POA: Diagnosis not present

## 2016-11-05 DIAGNOSIS — E782 Mixed hyperlipidemia: Secondary | ICD-10-CM | POA: Diagnosis not present

## 2016-11-05 DIAGNOSIS — H5461 Unqualified visual loss, right eye, normal vision left eye: Secondary | ICD-10-CM | POA: Diagnosis not present

## 2016-11-05 DIAGNOSIS — N186 End stage renal disease: Secondary | ICD-10-CM | POA: Diagnosis not present

## 2016-11-05 DIAGNOSIS — I5041 Acute combined systolic (congestive) and diastolic (congestive) heart failure: Secondary | ICD-10-CM | POA: Diagnosis not present

## 2016-11-05 DIAGNOSIS — M1991 Primary osteoarthritis, unspecified site: Secondary | ICD-10-CM | POA: Diagnosis not present

## 2016-11-05 DIAGNOSIS — I2510 Atherosclerotic heart disease of native coronary artery without angina pectoris: Secondary | ICD-10-CM | POA: Diagnosis not present

## 2016-11-05 NOTE — Patient Outreach (Signed)
[facility]) Care Management   11/05/2016  [name] 02-29-1945 [id number]  [name] is an 71 y.o. female  Subjective: client reports feeling "pretty good".  Objective:  BP 110/64   Pulse 69   Resp 20   Ht 1.651 m (_0 )   Wt 277 lb (125.6 kg)   SpO2 98%   BMI 46.10 kg/m   ROS  Physical Exam  Encounter Medications:   Outpatient Encounter Prescriptions as of 11/05/2016  Medication Sig Note  . atorvastatin (LIPITOR) 40 MG tablet Take 40 mg by mouth at bedtime.    . Calcium Carbonate-Vitamin D (CALCIUM 600 + D PO) Take 1 tablet by mouth daily.    . carvedilol (COREG) 25 MG tablet Take 1 tablet (25 mg) by mouth twice daily - first dose in the afternoon, second dose at bedtime.   . cinacalcet (SENSIPAR) 90 MG tablet Take 1 tablet (90 mg) by mouth daily. On dialysis days, take the dose after dialysis   . Multiple Vitamin (MULITIVITAMIN WITH MINERALS) TABS Take 1 tablet by mouth daily.   . sevelamer carbonate (RENVELA) 800 MG tablet Take 1 tablet (800 mg total) by mouth 3 (three) times daily with meals. (Patient taking differently: Take 3 tablets (2,400 mg) by mouth with meals. Take 2 tablets (1,600 mg) by mouth with snacks. Receives supply from dialysis center.)   . tiotropium (SPIRIVA) 18 MCG inhalation capsule Place 1 capsule (18 mcg total) into inhaler and inhale daily.   Marland Kitchen albuterol (PROVENTIL HFA;VENTOLIN HFA) 108 (90 BASE) MCG/ACT inhaler Inhale 2 puffs into the lungs every 4 (four) hours as needed for wheezing or shortness of breath.   Marland Kitchen albuterol (PROVENTIL) (2.5 MG/3ML) 0.083% nebulizer solution Take 3 mLs (2.5 mg total) by nebulization every 2 (two) hours as needed for shortness of breath.   Marland Kitchen aspirin EC 81 MG tablet Take 81 mg by mouth daily.     . bisacodyl (DULCOLAX) 5 MG EC tablet Take 10 mg by mouth daily as needed for moderate constipation.    . calcitRIOL (ROCALTROL) 0.25 MCG capsule Take 11 capsules (2.75 mcg total) by mouth every  Tuesday, Thursday, and Saturday at 6 PM. 11/05/2016: Receives this medication AT DIALYSIS. Not to be filled at an outpatient pharmacy  . diphenhydrAMINE (BENADRYL) 25 mg capsule Take 50 mg by mouth See admin instructions. Take 2 tablets (50 mg) by mouth prior to dialysis on Tuesday, Thursday, Saturday - for itching   . ethyl chloride spray Apply 1 application topically See admin instructions. Apply topically before dialysis on Tuesday, Thursday, Saturday   . HYDROcodone-acetaminophen (NORCO/VICODIN) 5-325 MG tablet Take 1 tablet by mouth 2 (two) times daily as needed (knee pain).    . insulin glargine (LANTUS) 100 UNIT/ML injection Inject 0.1 mLs (10 Units total) into the skin at bedtime.   . midodrine (PROAMATINE) 10 MG tablet Take 1 tablet by mouth twice daily at 4AM and 9AM. On dialysis days, take first dose before dialysis and second dose mid-way through dialysis.   . [DISCONTINUED] furosemide (LASIX) 40 MG tablet Take 40 mg by mouth daily.    No facility-administered encounter medications on file as of 11/05/2016.     Functional Status:   In your present state of health, do you have any difficulty performing the following activities: 11/05/2016 10/13/2016  Hearing? N N  Vision? N N  Comment - -  Difficulty concentrating or making decisions? N N  Walking or climbing stairs? Y Y  Dressing or  bathing? N N  Doing errands, shopping? N [place] and eating ? N -  Using the Toilet? N -  In the past six months, have you accidently leaked urine? N -  Do you have problems with loss of bowel control? N -  Managing your Medications? N -  Comment - -  Managing your Finances? N -  Housekeeping or managing your Housekeeping? N -  Some recent data might be hidden    Fall/Depression Screening:    Fall Risk  10/23/2016 02/15/2016 11/29/2015  Falls in the past year? No No Yes  Number falls in past yr: - - 1  Injury with Fall? - - Yes  Follow up - - Falls prevention discussed    PHQ 2/9 Scores 10/23/2016 02/15/2016 11/29/2015 11/08/2015 09/27/2015 09/18/2015  PHQ - 2 Score 0 0 0 0 0 0    Assessment:  71 year old with recent admission 9/24-10/2 for heart failure. History of ESRD/HD, diabetes, COPD. Home oxygen use at 2l/[place]. Client reports doing better. Denies shortness of breath.  RNCM completed home visit. Upon RNCM's arrival client was having an intake assessment with [name] for continued personal care assistance service.  History of heart failure. Client reports she is weighing self daily. She is also attending hemodialysis three times/week.  Medications reviewed with [facility] pharmacy resident over the phone.  Client with no issues or concerns noted today.    Plan: telephonic transition of care next week. [name] CM Care Plan Problem One     Most Recent Value  Care Plan Problem One  at risk for readmission  Role Documenting the Problem One  Care Management [place] for Problem One  Active  [facility] Long Term Goal   client will not be readmitted within the next 31 days.  [name] Long Term Goal Start Date  10/27/16  Interventions for Problem One Long Term Goal  home visit completed, review of medications telephonically with [place] resident, client encouraged to review Heart failure zone tool, provided [facility] calendar/organizer and reinforced how to use.Marland Kitchen  [name] CM Short Term Goal #1   client will verbalize contact with the respiratory team regarding equipment use. oms.  [name] CM Short Term Goal #1 Start Date  10/27/16  [facility] CM Short Term Goal #1 Met Date  11/05/16  [name] CM Short Term Goal #2   client will attend follow appointment as scheduled. within the next 30 days.  [name] CM Short Term Goal #2 Start Date  10/27/16  Interventions for Short Term Goal #2  reinforced the importance of following up with provider, reviewed upcoming appointments, discussed transporation to appointments.       Tiffany Silversmith, RN, MSN, [place] Coordinator Cell:  [phone]

## 2016-11-05 NOTE — Patient Outreach (Addendum)
[facility]) Care Management  11/05/2016  HARNOOR RETA 1945-10-29 [id number]  71 y.o. year old female referred to [place] for Medication Management (Telephone outreach f/u)  Reached out to pharmacy and providers and obtained medication lists. Many discrepancies noted. Was able to reach patient via [facility] nurse who happened to be at patient's home at time of call.   1. Sevelamer: On medication list from dialysis center however [facility] has [facility].    -Patient states that she gets this from her dialysis center, takes 2400 mg with meals and 1600 mg with snacks  -Per 11/03/2016 inbasket message from Dr. Joelyn Oms - will address at dialysis  2. Midodrine: prescription requires clarification as med list originally indicated patient was to take at bedtime which is likely not how medication was intended to be taken given risk of hypertension when supine.   -Patient reports she takes one dose at 4AM and another dose at 9AM on both dialysis and non-dialysis days  -[place] and clarified dosing instructions with Dr. Joelyn Oms via nurse on telephone: Take 10 mg daily with meals and on dialysis days take first dose before dialysis and second dose 1/2 way through dialysis as needed.    3. Calcitriol: Per [facility], prior authorization required for calcitriol. Prior authorization sent to inpatient provider instead of nephrologist  --Per 10/31/2016 inbasket message from Dr. Joelyn Oms - will give doses at dialysis, not to be filled outpatient. Communicated this to [facility] pharmacy 11/05/2016. [facility] deleted prescription record.  4. Lantus: Pharmacy also has direction for lantus: 20 units daily which is consistent with [facility] records, however at last discharge, patient was ordered 10 units daily. Will need to clarify with patient.   - Patient reports she takes 10 units at bedtime.  5. Spiriva - Patient reports she is taking  BID. Confirms she is using handihaler and describes appropriate inhaler technique.  - Prescribed as once daily  Plan: -Midodrine updated on medication list -Sevelamer updated on medication list -Calcitriol updated on medication list -Lantus updated on medication list -Educated patient that Spiriva is to be taken once daily. Patient verbalized understanding.  -Please re-consult as needed for [facility] pharmacy involvement, will sign off.  Carlean Jews, Pharm.D. PGY2 Ambulatory Care Pharmacy Resident Phone: [phone]

## 2016-11-06 DIAGNOSIS — D689 Coagulation defect, unspecified: Secondary | ICD-10-CM | POA: Diagnosis not present

## 2016-11-06 DIAGNOSIS — N186 End stage renal disease: Secondary | ICD-10-CM | POA: Diagnosis not present

## 2016-11-06 DIAGNOSIS — E1129 Type 2 diabetes mellitus with other diabetic kidney complication: Secondary | ICD-10-CM | POA: Diagnosis not present

## 2016-11-06 DIAGNOSIS — E875 Hyperkalemia: Secondary | ICD-10-CM | POA: Diagnosis not present

## 2016-11-06 DIAGNOSIS — N2581 Secondary hyperparathyroidism of renal origin: Secondary | ICD-10-CM | POA: Diagnosis not present

## 2016-11-06 DIAGNOSIS — D631 Anemia in chronic kidney disease: Secondary | ICD-10-CM | POA: Diagnosis not present

## 2016-11-06 DIAGNOSIS — E119 Type 2 diabetes mellitus without complications: Secondary | ICD-10-CM | POA: Diagnosis not present

## 2016-11-06 DIAGNOSIS — E782 Mixed hyperlipidemia: Secondary | ICD-10-CM | POA: Diagnosis not present

## 2016-11-07 ENCOUNTER — Other Ambulatory Visit: Payer: Medicare HMO

## 2016-11-07 ENCOUNTER — Encounter: Payer: Self-pay | Admitting: Pulmonary Disease

## 2016-11-07 ENCOUNTER — Ambulatory Visit (INDEPENDENT_AMBULATORY_CARE_PROVIDER_SITE_OTHER): Payer: Medicare HMO | Admitting: Pulmonary Disease

## 2016-11-07 VITALS — BP 130/70 | HR 77 | Ht 65.0 in | Wt 275.1 lb

## 2016-11-07 DIAGNOSIS — E782 Mixed hyperlipidemia: Secondary | ICD-10-CM | POA: Diagnosis not present

## 2016-11-07 DIAGNOSIS — Z9989 Dependence on other enabling machines and devices: Secondary | ICD-10-CM

## 2016-11-07 DIAGNOSIS — M1991 Primary osteoarthritis, unspecified site: Secondary | ICD-10-CM | POA: Diagnosis not present

## 2016-11-07 DIAGNOSIS — I5041 Acute combined systolic (congestive) and diastolic (congestive) heart failure: Secondary | ICD-10-CM | POA: Diagnosis not present

## 2016-11-07 DIAGNOSIS — I2510 Atherosclerotic heart disease of native coronary artery without angina pectoris: Secondary | ICD-10-CM | POA: Diagnosis not present

## 2016-11-07 DIAGNOSIS — D631 Anemia in chronic kidney disease: Secondary | ICD-10-CM | POA: Diagnosis not present

## 2016-11-07 DIAGNOSIS — G4733 Obstructive sleep apnea (adult) (pediatric): Secondary | ICD-10-CM

## 2016-11-07 DIAGNOSIS — E1122 Type 2 diabetes mellitus with diabetic chronic kidney disease: Secondary | ICD-10-CM | POA: Diagnosis not present

## 2016-11-07 DIAGNOSIS — I132 Hypertensive heart and chronic kidney disease with heart failure and with stage 5 chronic kidney disease, or end stage renal disease: Secondary | ICD-10-CM | POA: Diagnosis not present

## 2016-11-07 DIAGNOSIS — H5461 Unqualified visual loss, right eye, normal vision left eye: Secondary | ICD-10-CM | POA: Diagnosis not present

## 2016-11-07 DIAGNOSIS — N186 End stage renal disease: Secondary | ICD-10-CM | POA: Diagnosis not present

## 2016-11-07 NOTE — Patient Instructions (Signed)
Continue using her Spiriva and supplemental oxygen We will check alpha-1 antitrypsin levels and phenotype We will schedule you for a split-night sleep study for evaluation of your sleep apnea Follow-up in 6 months.

## 2016-11-07 NOTE — Progress Notes (Signed)
[name]    [id number]    1945-05-28  Primary Care Physician:[name], MD  Referring Physician: [name], MD [address] [place], [place] [address]  Chief complaint:New consult, hospital follow-up for dyspnea, COPD   HPI: 71 year old with history of CHF, ESRD, diabetes mellitus, chronic respiratory failure, OSA on CPAP She was hospitalized at the end of September 2018 for acute on chronic systolic and diastolic heart failure with a chest x-ray showing cardiomegaly and pulmonary edema.  She is treated with 3 rounds of hemodialysis with improvement in dyspnea.  She had a CT scan at that time which showed emphysema.  VQ scan was low risk for pulmonary embolus. She was also treated for COPD exacerbation with steroids, inhalers and doxycycline.  She was discharged on home oxygen.  Since her discharge she states that her breathing is at baseline.  She is currently on Spiriva which was started a few months ago which seems to be helping.  No cough, sputum production, wheezing, hemoptysis.  She smoked a pack per day for 15 years.  Quit in 1970. She has history of sleep apnea but has not been using her CPAP for nearly 5 years due to equipment malfunction.  Outpatient Encounter Prescriptions as of 11/07/2016  Medication Sig  . albuterol (PROVENTIL HFA;VENTOLIN HFA) 108 (90 BASE) MCG/ACT inhaler Inhale 2 puffs into the lungs every 4 (four) hours as needed for wheezing or shortness of breath.  Marland Kitchen albuterol (PROVENTIL) (2.5 MG/3ML) 0.083% nebulizer solution Take 3 mLs (2.5 mg total) by nebulization every 2 (two) hours as needed for shortness of breath.  Marland Kitchen aspirin EC 81 MG tablet Take 81 mg by mouth daily.    Marland Kitchen atorvastatin (LIPITOR) 40 MG tablet Take 40 mg by mouth at bedtime.   . bisacodyl (DULCOLAX) 5 MG EC tablet Take 10 mg by mouth daily as needed for moderate constipation.   . calcitRIOL (ROCALTROL) 0.25 MCG capsule Take 11 capsules (2.75 mcg total) by mouth every  Tuesday, Thursday, and Saturday at 6 PM.  . Calcium Carbonate-Vitamin D (CALCIUM 600 + D PO) Take 1 tablet by mouth daily.   . carvedilol (COREG) 25 MG tablet Take 1 tablet (25 mg) by mouth twice daily - first dose in the afternoon, second dose at bedtime.  . cinacalcet (SENSIPAR) 90 MG tablet Take 1 tablet (90 mg) by mouth daily. On dialysis days, take the dose after dialysis  . diphenhydrAMINE (BENADRYL) 25 mg capsule Take 50 mg by mouth See admin instructions. Take 2 tablets (50 mg) by mouth prior to dialysis on Tuesday, Thursday, Saturday - for itching  . ethyl chloride spray Apply 1 application topically See admin instructions. Apply topically before dialysis on Tuesday, Thursday, Saturday  . HYDROcodone-acetaminophen (NORCO/VICODIN) 5-325 MG tablet Take 1 tablet by mouth 2 (two) times daily as needed (knee pain).   . insulin glargine (LANTUS) 100 UNIT/ML injection Inject 0.1 mLs (10 Units total) into the skin at bedtime.  . midodrine (PROAMATINE) 10 MG tablet Take 1 tablet by mouth twice daily at 4AM and 9AM. On dialysis days, take first dose before dialysis and second dose mid-way through dialysis.  . Multiple Vitamin (MULITIVITAMIN WITH MINERALS) TABS Take 1 tablet by mouth daily.  . sevelamer carbonate (RENVELA) 800 MG tablet Take 1 tablet (800 mg total) by mouth 3 (three) times daily with meals. (Patient taking differently: Take 3 tablets (2,400 mg) by mouth with meals. Take 2 tablets (1,600 mg) by mouth with  snacks. Receives supply from dialysis center.)  . tiotropium (SPIRIVA) 18 MCG inhalation capsule Place 1 capsule (18 mcg total) into inhaler and inhale daily.   No facility-administered encounter medications on file as of 11/07/2016.     Allergies as of 11/07/2016  . (No Known Allergies)    Past Medical History:  Diagnosis Date  . Abdominal abscess 12-17-10   abdominal abscesses x2 ? one at this time  . Anemia   . Arthritis    "all over" (08/23/2012)  . Blind right eye     fell and crushed socket and eye fell out, was replaced at [facility]  . CHF (congestive heart failure) ([place])   . Chronic lower back pain   . Coronary artery disease    Dr Kadakia-cardiologist 2-3 x year  . Dyspnea   . Dysrhythmia    irregular, "skips beats"  . ESRD (end stage renal disease) on dialysis ([place])    "started 12/2011; [place]; TTS" (08/23/2012  . Exertional shortness of breath   . Eye drainage    "lots; since fall 07/2011" (08/23/2012)  . Gout   . H/O hiatal hernia   . Headache(784.0)    "weekly sometimes; since I fell and hit my head in 07/2011 08/23/2012)  . Heart murmur   . History of blood transfusion    "lots before starting dialysis" (08/23/2012)  . Hypertension    sees Dr. Willey Blade  . Inhalation injury    "worked at [employer]; can't inhale polyurethane or paint, etc" (08/23/2012)  . Myocardial infarction ([place]) 1970's or 1980's  . Neuromuscular disorder ([place])    diabetic neuropathy  . OSA on CPAP   . Staph aureus infection November 2012   . Swelling of both ankles 12-17-10  . Type II diabetes mellitus ([place])    "over 10 years now" (08/23/2012)    Past Surgical History:  Procedure Laterality Date  . APPENDECTOMY    . AV FISTULA PLACEMENT  09/30/2011   Procedure: ARTERIOVENOUS (AV) FISTULA CREATION;  Surgeon: Conrad [place], MD;  Location: [facility];  Service: Vascular;  Laterality: Left;  BRACHIAL-CEPHALIC  . BACK SURGERY    . BASCILIC VEIN TRANSPOSITION Left 11/10/2011   Left arm   . [place] TRANSPOSITION  01/28/2012   Procedure: BASCILIC VEIN TRANSPOSITION;  Surgeon: Conrad Bradford Woods, MD;  Location: [facility];  Service: Vascular;  Laterality: Left;  Second Stage   . CARDIAC CATHETERIZATION  1980's  . CATARACT EXTRACTION W/ INTRAOCULAR LENS IMPLANT Bilateral 1990's  . DILATION AND CURETTAGE OF UTERUS  1960's  . EYE SURGERY    . HERNIA REPAIR    . INSERTION OF DIALYSIS CATHETER  01/05/2012   Procedure: INSERTION OF DIALYSIS CATHETER;  Surgeon: Conrad [place], MD;   Location: [facility];  Service: Vascular;  Laterality: N/A;  Right Internal Jugular Placement  . INTRAOCULAR PROSTHESES INSERTION Right 2013   "fell; knocked my eye outl" (08/23/2012)  . JOINT REPLACEMENT    . LAPAROTOMY  12/18/2010   Procedure: EXPLORATORY LAPAROTOMY;  Surgeon: Rolm Bookbinder, MD;  Location: [facility];  Service: General;  Laterality: N/A;  Abdominal Seroma Evacuation  . LEFT HEART CATHETERIZATION WITH CORONARY ANGIOGRAM N/A 08/25/2012   Procedure: LEFT HEART CATHETERIZATION WITH CORONARY ANGIOGRAM;  Surgeon: Birdie Riddle, MD;  Location: [facility];  Service: Cardiovascular;  Laterality: N/A;  . LUMBAR DISC SURGERY  1970'-80's   X 3  . TOTAL KNEE ARTHROPLASTY Left 1980's  . VAGINAL HYSTERECTOMY  1970's  . VENTRAL  HERNIA REPAIR  2012-2013   component separation, repair with biologic; "had 3 surgeries to fix it" (08/23/2012)  . WOUND DEBRIDEMENT  03/20/2011   Procedure: DEBRIDEMENT ABDOMINAL WOUND;  Surgeon: Rolm Bookbinder, MD;  Location: [facility];  Service: General;  Laterality: N/A;  debridement abdominal wall, placement of wound vac    Family History  Problem Relation Age of Onset  . Hypertension Mother   . Cancer Brother        spine  . Hypertension Father   . Cancer Sister        brain  . Hypertension Maternal Grandmother   . Anesthesia problems Neg Hx     Social History   Social History  . Marital status: Widowed    Spouse name: N/A  . Number of children: N/A  . Years of education: N/A   Occupational History  . Not on file.   Social History Main Topics  . Smoking status: Former Smoker    Packs/day: 0.25    Years: 16.00    Types: Cigarettes    Quit date: 12/16/1980  . Smokeless tobacco: Never Used  . Alcohol use No  . Drug use: No  . Sexual activity: No   Other Topics Concern  . Not on file   Social History Narrative   ** Merged History Encounter **       Review of systems: Review of Systems  Constitutional: Negative for fever and chills.    HENT: Negative.   Eyes: Negative for blurred vision.  Respiratory: as per HPI  Cardiovascular: Negative for chest pain and palpitations.  Gastrointestinal: Negative for vomiting, diarrhea, blood per rectum. Genitourinary: Negative for dysuria, urgency, frequency and hematuria.  Musculoskeletal: Negative for myalgias, back pain and joint pain.  Skin: Negative for itching and rash.  Neurological: Negative for dizziness, tremors, focal weakness, seizures and loss of consciousness.  Endo/Heme/Allergies: Negative for environmental allergies.  Psychiatric/Behavioral: Negative for depression, suicidal ideas and hallucinations.  All other systems reviewed and are negative.  Physical Exam: Blood pressure 130/70, pulse 77, height 5\' 5"  (1.651 m), weight 275 lb 2 oz (124.8 kg), SpO2 98 %. Gen:      No acute distress HEENT:  EOMI, sclera anicteric Neck:     No masses; no thyromegaly Lungs:    Clear to auscultation bilaterally; normal respiratory effort CV:         Regular rate and rhythm; no murmurs Abd:      + bowel sounds; soft, non-tender; no palpable masses, no distension Ext:    No edema; adequate peripheral perfusion Skin:      Warm and dry; no rash Neuro: alert and oriented x 3 Psych: normal mood and affect  Data Reviewed: Chest x-ray 10/13/16-cardiomegaly, pulmonary edema CT scan 10/16/16- mild cardiomegaly but mild emphysema and dependent atelectasis Had reviewed all images personally. VQ scan 10/17/16-low probability for pulmonary embolism  Cardiac stress test 10/20/16-no evidence of ischemia Echocardiogram 10/15/16-mild LVH, LVEF 55-60%, elevated ventricular filling pressure, PA peak pressure 32  PFTs 10/31/16 FVC 2.03 [84%], FEV1 1.82 [98%], F/F 90, TLC 80%, DLCO 52%, DLCO/VA 92%. Isolated reduction in diffusion capacity  Assessment:  Consult for evaluation of dyspnea, emphysema CT scan shows mild emphysematous changes with bulla but there is no evidence of COPD on PFTs.  She  has minimal smoking history in the past. She is currently stable on Spiriva and she will continue on the same.  I suspect her dyspnea is mostly from heart failure. Continue on supplemental oxygen Check alpha-1  antitrypsin levels.  PFTs noted for isolated reduction in diffusion capacity which corrects for alveolar volume. This may be secondary to heart failure. There is no evidence of interstitial lung disease on CT scan or pulmonary hypertension on echocardiogram  Untreated sleep apnea Not been on CPAP for the past 5 years.  She will need a reevaluation with a split-night sleep study.  Plan/Recommendations: - Continue Spiriva and supplemental oxygen - Check alpha-1 antitrypsin levels. - Sleep study.  Marshell Garfinkel MD [facility] Pager [phone] 11/07/2016, 11:08 AM  CC: [name], MD

## 2016-11-08 DIAGNOSIS — E875 Hyperkalemia: Secondary | ICD-10-CM | POA: Diagnosis not present

## 2016-11-08 DIAGNOSIS — E1129 Type 2 diabetes mellitus with other diabetic kidney complication: Secondary | ICD-10-CM | POA: Diagnosis not present

## 2016-11-08 DIAGNOSIS — N186 End stage renal disease: Secondary | ICD-10-CM | POA: Diagnosis not present

## 2016-11-08 DIAGNOSIS — E119 Type 2 diabetes mellitus without complications: Secondary | ICD-10-CM | POA: Diagnosis not present

## 2016-11-08 DIAGNOSIS — E782 Mixed hyperlipidemia: Secondary | ICD-10-CM | POA: Diagnosis not present

## 2016-11-08 DIAGNOSIS — N2581 Secondary hyperparathyroidism of renal origin: Secondary | ICD-10-CM | POA: Diagnosis not present

## 2016-11-08 DIAGNOSIS — D689 Coagulation defect, unspecified: Secondary | ICD-10-CM | POA: Diagnosis not present

## 2016-11-08 DIAGNOSIS — D631 Anemia in chronic kidney disease: Secondary | ICD-10-CM | POA: Diagnosis not present

## 2016-11-09 DIAGNOSIS — E782 Mixed hyperlipidemia: Secondary | ICD-10-CM | POA: Diagnosis not present

## 2016-11-10 DIAGNOSIS — E782 Mixed hyperlipidemia: Secondary | ICD-10-CM | POA: Diagnosis not present

## 2016-11-11 DIAGNOSIS — N2581 Secondary hyperparathyroidism of renal origin: Secondary | ICD-10-CM | POA: Diagnosis not present

## 2016-11-11 DIAGNOSIS — E1129 Type 2 diabetes mellitus with other diabetic kidney complication: Secondary | ICD-10-CM | POA: Diagnosis not present

## 2016-11-11 DIAGNOSIS — D689 Coagulation defect, unspecified: Secondary | ICD-10-CM | POA: Diagnosis not present

## 2016-11-11 DIAGNOSIS — E119 Type 2 diabetes mellitus without complications: Secondary | ICD-10-CM | POA: Diagnosis not present

## 2016-11-11 DIAGNOSIS — E875 Hyperkalemia: Secondary | ICD-10-CM | POA: Diagnosis not present

## 2016-11-11 DIAGNOSIS — D631 Anemia in chronic kidney disease: Secondary | ICD-10-CM | POA: Diagnosis not present

## 2016-11-11 DIAGNOSIS — E782 Mixed hyperlipidemia: Secondary | ICD-10-CM | POA: Diagnosis not present

## 2016-11-11 DIAGNOSIS — N186 End stage renal disease: Secondary | ICD-10-CM | POA: Diagnosis not present

## 2016-11-11 LAB — ALPHA-1 ANTITRYPSIN PHENOTYPE: A-1 Antitrypsin, Ser: 160 mg/dL (ref 83–199)

## 2016-11-12 ENCOUNTER — Other Ambulatory Visit: Payer: Self-pay

## 2016-11-12 DIAGNOSIS — H5461 Unqualified visual loss, right eye, normal vision left eye: Secondary | ICD-10-CM | POA: Diagnosis not present

## 2016-11-12 DIAGNOSIS — I132 Hypertensive heart and chronic kidney disease with heart failure and with stage 5 chronic kidney disease, or end stage renal disease: Secondary | ICD-10-CM | POA: Diagnosis not present

## 2016-11-12 DIAGNOSIS — I5041 Acute combined systolic (congestive) and diastolic (congestive) heart failure: Secondary | ICD-10-CM | POA: Diagnosis not present

## 2016-11-12 DIAGNOSIS — M1991 Primary osteoarthritis, unspecified site: Secondary | ICD-10-CM | POA: Diagnosis not present

## 2016-11-12 DIAGNOSIS — I2510 Atherosclerotic heart disease of native coronary artery without angina pectoris: Secondary | ICD-10-CM | POA: Diagnosis not present

## 2016-11-12 DIAGNOSIS — D631 Anemia in chronic kidney disease: Secondary | ICD-10-CM | POA: Diagnosis not present

## 2016-11-12 DIAGNOSIS — N186 End stage renal disease: Secondary | ICD-10-CM | POA: Diagnosis not present

## 2016-11-12 DIAGNOSIS — E782 Mixed hyperlipidemia: Secondary | ICD-10-CM | POA: Diagnosis not present

## 2016-11-12 DIAGNOSIS — E1122 Type 2 diabetes mellitus with diabetic chronic kidney disease: Secondary | ICD-10-CM | POA: Diagnosis not present

## 2016-11-12 NOTE — Patient Outreach (Signed)
[place] [facility]) Care Management  11/12/2016  [name] Aug 18, 1945 [id number]  Subjective: client reports "I am in the green zone".  Objective: none  Assessment: 71 year old with recent admission 9/24-10/2 for heart failure. History of ESRD/HD, diabetes, COPD. Home oxygen use at 2liters/[place]. Client reports doing better. Denies shortness of breath.  RNCM called for transition of care. Client is without shortness of breath, denies signs/symptoms of heart failure exacerbation. She continues to attend dialysis treatment as scheduled. She reports she is scheduled for a sleep study in December.  She denies any questions or concerns today.  Plan: transition of care call next week.  [name], RN, MSN, [place] Coordinator Cell: [phone]

## 2016-11-13 DIAGNOSIS — N2581 Secondary hyperparathyroidism of renal origin: Secondary | ICD-10-CM | POA: Diagnosis not present

## 2016-11-13 DIAGNOSIS — I5041 Acute combined systolic (congestive) and diastolic (congestive) heart failure: Secondary | ICD-10-CM | POA: Diagnosis not present

## 2016-11-13 DIAGNOSIS — E1122 Type 2 diabetes mellitus with diabetic chronic kidney disease: Secondary | ICD-10-CM | POA: Diagnosis not present

## 2016-11-13 DIAGNOSIS — M1991 Primary osteoarthritis, unspecified site: Secondary | ICD-10-CM | POA: Diagnosis not present

## 2016-11-13 DIAGNOSIS — I132 Hypertensive heart and chronic kidney disease with heart failure and with stage 5 chronic kidney disease, or end stage renal disease: Secondary | ICD-10-CM | POA: Diagnosis not present

## 2016-11-13 DIAGNOSIS — E1129 Type 2 diabetes mellitus with other diabetic kidney complication: Secondary | ICD-10-CM | POA: Diagnosis not present

## 2016-11-13 DIAGNOSIS — E875 Hyperkalemia: Secondary | ICD-10-CM | POA: Diagnosis not present

## 2016-11-13 DIAGNOSIS — E119 Type 2 diabetes mellitus without complications: Secondary | ICD-10-CM | POA: Diagnosis not present

## 2016-11-13 DIAGNOSIS — D689 Coagulation defect, unspecified: Secondary | ICD-10-CM | POA: Diagnosis not present

## 2016-11-13 DIAGNOSIS — D631 Anemia in chronic kidney disease: Secondary | ICD-10-CM | POA: Diagnosis not present

## 2016-11-13 DIAGNOSIS — I2510 Atherosclerotic heart disease of native coronary artery without angina pectoris: Secondary | ICD-10-CM | POA: Diagnosis not present

## 2016-11-13 DIAGNOSIS — H5461 Unqualified visual loss, right eye, normal vision left eye: Secondary | ICD-10-CM | POA: Diagnosis not present

## 2016-11-13 DIAGNOSIS — N186 End stage renal disease: Secondary | ICD-10-CM | POA: Diagnosis not present

## 2016-11-13 DIAGNOSIS — E782 Mixed hyperlipidemia: Secondary | ICD-10-CM | POA: Diagnosis not present

## 2016-11-14 DIAGNOSIS — N186 End stage renal disease: Secondary | ICD-10-CM | POA: Diagnosis not present

## 2016-11-14 DIAGNOSIS — M1991 Primary osteoarthritis, unspecified site: Secondary | ICD-10-CM | POA: Diagnosis not present

## 2016-11-14 DIAGNOSIS — I5041 Acute combined systolic (congestive) and diastolic (congestive) heart failure: Secondary | ICD-10-CM | POA: Diagnosis not present

## 2016-11-14 DIAGNOSIS — D631 Anemia in chronic kidney disease: Secondary | ICD-10-CM | POA: Diagnosis not present

## 2016-11-14 DIAGNOSIS — I132 Hypertensive heart and chronic kidney disease with heart failure and with stage 5 chronic kidney disease, or end stage renal disease: Secondary | ICD-10-CM | POA: Diagnosis not present

## 2016-11-14 DIAGNOSIS — E782 Mixed hyperlipidemia: Secondary | ICD-10-CM | POA: Diagnosis not present

## 2016-11-14 DIAGNOSIS — I2510 Atherosclerotic heart disease of native coronary artery without angina pectoris: Secondary | ICD-10-CM | POA: Diagnosis not present

## 2016-11-14 DIAGNOSIS — E1122 Type 2 diabetes mellitus with diabetic chronic kidney disease: Secondary | ICD-10-CM | POA: Diagnosis not present

## 2016-11-14 DIAGNOSIS — H5461 Unqualified visual loss, right eye, normal vision left eye: Secondary | ICD-10-CM | POA: Diagnosis not present

## 2016-11-15 DIAGNOSIS — N186 End stage renal disease: Secondary | ICD-10-CM | POA: Diagnosis not present

## 2016-11-15 DIAGNOSIS — D689 Coagulation defect, unspecified: Secondary | ICD-10-CM | POA: Diagnosis not present

## 2016-11-15 DIAGNOSIS — E782 Mixed hyperlipidemia: Secondary | ICD-10-CM | POA: Diagnosis not present

## 2016-11-15 DIAGNOSIS — N2581 Secondary hyperparathyroidism of renal origin: Secondary | ICD-10-CM | POA: Diagnosis not present

## 2016-11-15 DIAGNOSIS — E1129 Type 2 diabetes mellitus with other diabetic kidney complication: Secondary | ICD-10-CM | POA: Diagnosis not present

## 2016-11-15 DIAGNOSIS — E119 Type 2 diabetes mellitus without complications: Secondary | ICD-10-CM | POA: Diagnosis not present

## 2016-11-15 DIAGNOSIS — E875 Hyperkalemia: Secondary | ICD-10-CM | POA: Diagnosis not present

## 2016-11-15 DIAGNOSIS — D631 Anemia in chronic kidney disease: Secondary | ICD-10-CM | POA: Diagnosis not present

## 2016-11-16 DIAGNOSIS — E782 Mixed hyperlipidemia: Secondary | ICD-10-CM | POA: Diagnosis not present

## 2016-11-17 DIAGNOSIS — E782 Mixed hyperlipidemia: Secondary | ICD-10-CM | POA: Diagnosis not present

## 2016-11-18 DIAGNOSIS — M1991 Primary osteoarthritis, unspecified site: Secondary | ICD-10-CM | POA: Diagnosis not present

## 2016-11-18 DIAGNOSIS — E782 Mixed hyperlipidemia: Secondary | ICD-10-CM | POA: Diagnosis not present

## 2016-11-18 DIAGNOSIS — D631 Anemia in chronic kidney disease: Secondary | ICD-10-CM | POA: Diagnosis not present

## 2016-11-18 DIAGNOSIS — E1129 Type 2 diabetes mellitus with other diabetic kidney complication: Secondary | ICD-10-CM | POA: Diagnosis not present

## 2016-11-18 DIAGNOSIS — E875 Hyperkalemia: Secondary | ICD-10-CM | POA: Diagnosis not present

## 2016-11-18 DIAGNOSIS — I132 Hypertensive heart and chronic kidney disease with heart failure and with stage 5 chronic kidney disease, or end stage renal disease: Secondary | ICD-10-CM | POA: Diagnosis not present

## 2016-11-18 DIAGNOSIS — I5041 Acute combined systolic (congestive) and diastolic (congestive) heart failure: Secondary | ICD-10-CM | POA: Diagnosis not present

## 2016-11-18 DIAGNOSIS — I2510 Atherosclerotic heart disease of native coronary artery without angina pectoris: Secondary | ICD-10-CM | POA: Diagnosis not present

## 2016-11-18 DIAGNOSIS — H5461 Unqualified visual loss, right eye, normal vision left eye: Secondary | ICD-10-CM | POA: Diagnosis not present

## 2016-11-18 DIAGNOSIS — E119 Type 2 diabetes mellitus without complications: Secondary | ICD-10-CM | POA: Diagnosis not present

## 2016-11-18 DIAGNOSIS — N186 End stage renal disease: Secondary | ICD-10-CM | POA: Diagnosis not present

## 2016-11-18 DIAGNOSIS — E1122 Type 2 diabetes mellitus with diabetic chronic kidney disease: Secondary | ICD-10-CM | POA: Diagnosis not present

## 2016-11-18 DIAGNOSIS — D689 Coagulation defect, unspecified: Secondary | ICD-10-CM | POA: Diagnosis not present

## 2016-11-18 DIAGNOSIS — N2581 Secondary hyperparathyroidism of renal origin: Secondary | ICD-10-CM | POA: Diagnosis not present

## 2016-11-19 ENCOUNTER — Other Ambulatory Visit: Payer: Self-pay

## 2016-11-19 DIAGNOSIS — H5461 Unqualified visual loss, right eye, normal vision left eye: Secondary | ICD-10-CM | POA: Diagnosis not present

## 2016-11-19 DIAGNOSIS — J9600 Acute respiratory failure, unspecified whether with hypoxia or hypercapnia: Secondary | ICD-10-CM | POA: Diagnosis not present

## 2016-11-19 DIAGNOSIS — N186 End stage renal disease: Secondary | ICD-10-CM | POA: Diagnosis not present

## 2016-11-19 DIAGNOSIS — I5041 Acute combined systolic (congestive) and diastolic (congestive) heart failure: Secondary | ICD-10-CM | POA: Diagnosis not present

## 2016-11-19 DIAGNOSIS — I132 Hypertensive heart and chronic kidney disease with heart failure and with stage 5 chronic kidney disease, or end stage renal disease: Secondary | ICD-10-CM | POA: Diagnosis not present

## 2016-11-19 DIAGNOSIS — D6489 Other specified anemias: Secondary | ICD-10-CM | POA: Diagnosis not present

## 2016-11-19 DIAGNOSIS — E1140 Type 2 diabetes mellitus with diabetic neuropathy, unspecified: Secondary | ICD-10-CM | POA: Diagnosis not present

## 2016-11-19 DIAGNOSIS — E1122 Type 2 diabetes mellitus with diabetic chronic kidney disease: Secondary | ICD-10-CM | POA: Diagnosis not present

## 2016-11-19 DIAGNOSIS — M1991 Primary osteoarthritis, unspecified site: Secondary | ICD-10-CM | POA: Diagnosis not present

## 2016-11-19 DIAGNOSIS — J449 Chronic obstructive pulmonary disease, unspecified: Secondary | ICD-10-CM | POA: Diagnosis not present

## 2016-11-19 DIAGNOSIS — D631 Anemia in chronic kidney disease: Secondary | ICD-10-CM | POA: Diagnosis not present

## 2016-11-19 DIAGNOSIS — E782 Mixed hyperlipidemia: Secondary | ICD-10-CM | POA: Diagnosis not present

## 2016-11-19 DIAGNOSIS — E1129 Type 2 diabetes mellitus with other diabetic kidney complication: Secondary | ICD-10-CM | POA: Diagnosis not present

## 2016-11-19 DIAGNOSIS — Z992 Dependence on renal dialysis: Secondary | ICD-10-CM | POA: Diagnosis not present

## 2016-11-19 DIAGNOSIS — I509 Heart failure, unspecified: Secondary | ICD-10-CM | POA: Diagnosis not present

## 2016-11-19 DIAGNOSIS — I2510 Atherosclerotic heart disease of native coronary artery without angina pectoris: Secondary | ICD-10-CM | POA: Diagnosis not present

## 2016-11-19 DIAGNOSIS — Z6841 Body Mass Index (BMI) 40.0 and over, adult: Secondary | ICD-10-CM | POA: Diagnosis not present

## 2016-11-19 NOTE — Patient Outreach (Signed)
[place] [facility]) Care Management  11/19/2016  [name] 1946-01-13 [id number]   Subjective: client reports "I am in the green zone".  Objective: none  Assessment: 71 year old with recent admission 9/24-10/2 for heart failure. History of ESRD/HD, diabetes, COPD. Home oxygen use at 2liters/[place]. Client reports doing better. Denies shortness of breath.  RNCM called for transition of care. Client denies any questions or concerns at this time.  Plan: follow up telephonically next week.  [name], RN, MSN, [place] Coordinator Cell: [phone]

## 2016-11-20 DIAGNOSIS — N2581 Secondary hyperparathyroidism of renal origin: Secondary | ICD-10-CM | POA: Diagnosis not present

## 2016-11-20 DIAGNOSIS — D631 Anemia in chronic kidney disease: Secondary | ICD-10-CM | POA: Diagnosis not present

## 2016-11-20 DIAGNOSIS — E119 Type 2 diabetes mellitus without complications: Secondary | ICD-10-CM | POA: Diagnosis not present

## 2016-11-20 DIAGNOSIS — E875 Hyperkalemia: Secondary | ICD-10-CM | POA: Diagnosis not present

## 2016-11-20 DIAGNOSIS — F419 Anxiety disorder, unspecified: Secondary | ICD-10-CM | POA: Diagnosis not present

## 2016-11-20 DIAGNOSIS — D688 Other specified coagulation defects: Secondary | ICD-10-CM | POA: Diagnosis not present

## 2016-11-20 DIAGNOSIS — N186 End stage renal disease: Secondary | ICD-10-CM | POA: Diagnosis not present

## 2016-11-20 DIAGNOSIS — E782 Mixed hyperlipidemia: Secondary | ICD-10-CM | POA: Diagnosis not present

## 2016-11-20 DIAGNOSIS — D689 Coagulation defect, unspecified: Secondary | ICD-10-CM | POA: Diagnosis not present

## 2016-11-21 DIAGNOSIS — J449 Chronic obstructive pulmonary disease, unspecified: Secondary | ICD-10-CM | POA: Diagnosis not present

## 2016-11-21 DIAGNOSIS — G4733 Obstructive sleep apnea (adult) (pediatric): Secondary | ICD-10-CM | POA: Diagnosis not present

## 2016-11-21 DIAGNOSIS — I270 Primary pulmonary hypertension: Secondary | ICD-10-CM | POA: Diagnosis not present

## 2016-11-21 DIAGNOSIS — E782 Mixed hyperlipidemia: Secondary | ICD-10-CM | POA: Diagnosis not present

## 2016-11-21 DIAGNOSIS — H40022 Open angle with borderline findings, high risk, left eye: Secondary | ICD-10-CM | POA: Diagnosis not present

## 2016-11-21 DIAGNOSIS — I5043 Acute on chronic combined systolic (congestive) and diastolic (congestive) heart failure: Secondary | ICD-10-CM | POA: Diagnosis not present

## 2016-11-21 DIAGNOSIS — R0901 Asphyxia: Secondary | ICD-10-CM | POA: Diagnosis not present

## 2016-11-21 DIAGNOSIS — R0602 Shortness of breath: Secondary | ICD-10-CM | POA: Diagnosis not present

## 2016-11-22 DIAGNOSIS — E782 Mixed hyperlipidemia: Secondary | ICD-10-CM | POA: Diagnosis not present

## 2016-11-23 DIAGNOSIS — E782 Mixed hyperlipidemia: Secondary | ICD-10-CM | POA: Diagnosis not present

## 2016-11-24 DIAGNOSIS — E875 Hyperkalemia: Secondary | ICD-10-CM | POA: Diagnosis not present

## 2016-11-24 DIAGNOSIS — I871 Compression of vein: Secondary | ICD-10-CM | POA: Diagnosis not present

## 2016-11-24 DIAGNOSIS — T82858A Stenosis of vascular prosthetic devices, implants and grafts, initial encounter: Secondary | ICD-10-CM | POA: Diagnosis not present

## 2016-11-24 DIAGNOSIS — Z992 Dependence on renal dialysis: Secondary | ICD-10-CM | POA: Diagnosis not present

## 2016-11-24 DIAGNOSIS — D688 Other specified coagulation defects: Secondary | ICD-10-CM | POA: Diagnosis not present

## 2016-11-24 DIAGNOSIS — E119 Type 2 diabetes mellitus without complications: Secondary | ICD-10-CM | POA: Diagnosis not present

## 2016-11-24 DIAGNOSIS — F419 Anxiety disorder, unspecified: Secondary | ICD-10-CM | POA: Diagnosis not present

## 2016-11-24 DIAGNOSIS — D631 Anemia in chronic kidney disease: Secondary | ICD-10-CM | POA: Diagnosis not present

## 2016-11-24 DIAGNOSIS — N186 End stage renal disease: Secondary | ICD-10-CM | POA: Diagnosis not present

## 2016-11-24 DIAGNOSIS — N2581 Secondary hyperparathyroidism of renal origin: Secondary | ICD-10-CM | POA: Diagnosis not present

## 2016-11-24 DIAGNOSIS — D689 Coagulation defect, unspecified: Secondary | ICD-10-CM | POA: Diagnosis not present

## 2016-11-25 DIAGNOSIS — E119 Type 2 diabetes mellitus without complications: Secondary | ICD-10-CM | POA: Diagnosis not present

## 2016-11-25 DIAGNOSIS — D688 Other specified coagulation defects: Secondary | ICD-10-CM | POA: Diagnosis not present

## 2016-11-25 DIAGNOSIS — F419 Anxiety disorder, unspecified: Secondary | ICD-10-CM | POA: Diagnosis not present

## 2016-11-25 DIAGNOSIS — D631 Anemia in chronic kidney disease: Secondary | ICD-10-CM | POA: Diagnosis not present

## 2016-11-25 DIAGNOSIS — E875 Hyperkalemia: Secondary | ICD-10-CM | POA: Diagnosis not present

## 2016-11-25 DIAGNOSIS — N186 End stage renal disease: Secondary | ICD-10-CM | POA: Diagnosis not present

## 2016-11-25 DIAGNOSIS — D689 Coagulation defect, unspecified: Secondary | ICD-10-CM | POA: Diagnosis not present

## 2016-11-25 DIAGNOSIS — N2581 Secondary hyperparathyroidism of renal origin: Secondary | ICD-10-CM | POA: Diagnosis not present

## 2016-11-26 ENCOUNTER — Other Ambulatory Visit: Payer: Self-pay

## 2016-11-26 DIAGNOSIS — M1991 Primary osteoarthritis, unspecified site: Secondary | ICD-10-CM | POA: Diagnosis not present

## 2016-11-26 DIAGNOSIS — I132 Hypertensive heart and chronic kidney disease with heart failure and with stage 5 chronic kidney disease, or end stage renal disease: Secondary | ICD-10-CM | POA: Diagnosis not present

## 2016-11-26 DIAGNOSIS — I5041 Acute combined systolic (congestive) and diastolic (congestive) heart failure: Secondary | ICD-10-CM | POA: Diagnosis not present

## 2016-11-26 DIAGNOSIS — I2510 Atherosclerotic heart disease of native coronary artery without angina pectoris: Secondary | ICD-10-CM | POA: Diagnosis not present

## 2016-11-26 DIAGNOSIS — E1122 Type 2 diabetes mellitus with diabetic chronic kidney disease: Secondary | ICD-10-CM | POA: Diagnosis not present

## 2016-11-26 DIAGNOSIS — H5461 Unqualified visual loss, right eye, normal vision left eye: Secondary | ICD-10-CM | POA: Diagnosis not present

## 2016-11-26 DIAGNOSIS — N186 End stage renal disease: Secondary | ICD-10-CM | POA: Diagnosis not present

## 2016-11-26 DIAGNOSIS — D631 Anemia in chronic kidney disease: Secondary | ICD-10-CM | POA: Diagnosis not present

## 2016-11-26 NOTE — Patient Outreach (Signed)
[facility]) Care Management  11/26/2016  [name] [birth year] [id number]   Subjective: "I'm fine"  Objective: none  Assessment: 71 year old with recent admission 9/24-10/2 for heart failure. History of ESRD/HD, diabetes, COPD. Home oxygen use at 2liters/[place].    RNCM called to follow up. Client denies signs/symptoms of heart failure exacerbation. Denies any questions or concerns.  Plan: follow up next month to assess for additional care management needs.  [name], RN, MSN, [place] Coordinator Cell: [phone]

## 2016-11-27 DIAGNOSIS — E119 Type 2 diabetes mellitus without complications: Secondary | ICD-10-CM | POA: Diagnosis not present

## 2016-11-27 DIAGNOSIS — F419 Anxiety disorder, unspecified: Secondary | ICD-10-CM | POA: Diagnosis not present

## 2016-11-27 DIAGNOSIS — D631 Anemia in chronic kidney disease: Secondary | ICD-10-CM | POA: Diagnosis not present

## 2016-11-27 DIAGNOSIS — N186 End stage renal disease: Secondary | ICD-10-CM | POA: Diagnosis not present

## 2016-11-27 DIAGNOSIS — N2581 Secondary hyperparathyroidism of renal origin: Secondary | ICD-10-CM | POA: Diagnosis not present

## 2016-11-27 DIAGNOSIS — D688 Other specified coagulation defects: Secondary | ICD-10-CM | POA: Diagnosis not present

## 2016-11-27 DIAGNOSIS — D689 Coagulation defect, unspecified: Secondary | ICD-10-CM | POA: Diagnosis not present

## 2016-11-27 DIAGNOSIS — E875 Hyperkalemia: Secondary | ICD-10-CM | POA: Diagnosis not present

## 2016-11-28 DIAGNOSIS — I871 Compression of vein: Secondary | ICD-10-CM | POA: Diagnosis not present

## 2016-11-28 DIAGNOSIS — Z992 Dependence on renal dialysis: Secondary | ICD-10-CM | POA: Diagnosis not present

## 2016-11-28 DIAGNOSIS — N186 End stage renal disease: Secondary | ICD-10-CM | POA: Diagnosis not present

## 2016-11-28 DIAGNOSIS — T82898A Other specified complication of vascular prosthetic devices, implants and grafts, initial encounter: Secondary | ICD-10-CM | POA: Diagnosis not present

## 2016-11-29 DIAGNOSIS — E875 Hyperkalemia: Secondary | ICD-10-CM | POA: Diagnosis not present

## 2016-11-29 DIAGNOSIS — D689 Coagulation defect, unspecified: Secondary | ICD-10-CM | POA: Diagnosis not present

## 2016-11-29 DIAGNOSIS — E119 Type 2 diabetes mellitus without complications: Secondary | ICD-10-CM | POA: Diagnosis not present

## 2016-11-29 DIAGNOSIS — F419 Anxiety disorder, unspecified: Secondary | ICD-10-CM | POA: Diagnosis not present

## 2016-11-29 DIAGNOSIS — N186 End stage renal disease: Secondary | ICD-10-CM | POA: Diagnosis not present

## 2016-11-29 DIAGNOSIS — D688 Other specified coagulation defects: Secondary | ICD-10-CM | POA: Diagnosis not present

## 2016-11-29 DIAGNOSIS — N2581 Secondary hyperparathyroidism of renal origin: Secondary | ICD-10-CM | POA: Diagnosis not present

## 2016-11-29 DIAGNOSIS — D631 Anemia in chronic kidney disease: Secondary | ICD-10-CM | POA: Diagnosis not present

## 2016-12-01 DIAGNOSIS — E782 Mixed hyperlipidemia: Secondary | ICD-10-CM | POA: Diagnosis not present

## 2016-12-02 DIAGNOSIS — E119 Type 2 diabetes mellitus without complications: Secondary | ICD-10-CM | POA: Diagnosis not present

## 2016-12-02 DIAGNOSIS — D689 Coagulation defect, unspecified: Secondary | ICD-10-CM | POA: Diagnosis not present

## 2016-12-02 DIAGNOSIS — N186 End stage renal disease: Secondary | ICD-10-CM | POA: Diagnosis not present

## 2016-12-02 DIAGNOSIS — F419 Anxiety disorder, unspecified: Secondary | ICD-10-CM | POA: Diagnosis not present

## 2016-12-02 DIAGNOSIS — N2581 Secondary hyperparathyroidism of renal origin: Secondary | ICD-10-CM | POA: Diagnosis not present

## 2016-12-02 DIAGNOSIS — E875 Hyperkalemia: Secondary | ICD-10-CM | POA: Diagnosis not present

## 2016-12-02 DIAGNOSIS — E782 Mixed hyperlipidemia: Secondary | ICD-10-CM | POA: Diagnosis not present

## 2016-12-02 DIAGNOSIS — D631 Anemia in chronic kidney disease: Secondary | ICD-10-CM | POA: Diagnosis not present

## 2016-12-02 DIAGNOSIS — D688 Other specified coagulation defects: Secondary | ICD-10-CM | POA: Diagnosis not present

## 2016-12-03 DIAGNOSIS — I132 Hypertensive heart and chronic kidney disease with heart failure and with stage 5 chronic kidney disease, or end stage renal disease: Secondary | ICD-10-CM | POA: Diagnosis not present

## 2016-12-03 DIAGNOSIS — H5461 Unqualified visual loss, right eye, normal vision left eye: Secondary | ICD-10-CM | POA: Diagnosis not present

## 2016-12-03 DIAGNOSIS — E1122 Type 2 diabetes mellitus with diabetic chronic kidney disease: Secondary | ICD-10-CM | POA: Diagnosis not present

## 2016-12-03 DIAGNOSIS — E782 Mixed hyperlipidemia: Secondary | ICD-10-CM | POA: Diagnosis not present

## 2016-12-03 DIAGNOSIS — I2510 Atherosclerotic heart disease of native coronary artery without angina pectoris: Secondary | ICD-10-CM | POA: Diagnosis not present

## 2016-12-03 DIAGNOSIS — N186 End stage renal disease: Secondary | ICD-10-CM | POA: Diagnosis not present

## 2016-12-03 DIAGNOSIS — M1991 Primary osteoarthritis, unspecified site: Secondary | ICD-10-CM | POA: Diagnosis not present

## 2016-12-03 DIAGNOSIS — I5041 Acute combined systolic (congestive) and diastolic (congestive) heart failure: Secondary | ICD-10-CM | POA: Diagnosis not present

## 2016-12-03 DIAGNOSIS — D631 Anemia in chronic kidney disease: Secondary | ICD-10-CM | POA: Diagnosis not present

## 2016-12-04 DIAGNOSIS — N186 End stage renal disease: Secondary | ICD-10-CM | POA: Diagnosis not present

## 2016-12-04 DIAGNOSIS — D689 Coagulation defect, unspecified: Secondary | ICD-10-CM | POA: Diagnosis not present

## 2016-12-04 DIAGNOSIS — N2581 Secondary hyperparathyroidism of renal origin: Secondary | ICD-10-CM | POA: Diagnosis not present

## 2016-12-04 DIAGNOSIS — E875 Hyperkalemia: Secondary | ICD-10-CM | POA: Diagnosis not present

## 2016-12-04 DIAGNOSIS — F419 Anxiety disorder, unspecified: Secondary | ICD-10-CM | POA: Diagnosis not present

## 2016-12-04 DIAGNOSIS — E119 Type 2 diabetes mellitus without complications: Secondary | ICD-10-CM | POA: Diagnosis not present

## 2016-12-04 DIAGNOSIS — E782 Mixed hyperlipidemia: Secondary | ICD-10-CM | POA: Diagnosis not present

## 2016-12-04 DIAGNOSIS — D631 Anemia in chronic kidney disease: Secondary | ICD-10-CM | POA: Diagnosis not present

## 2016-12-04 DIAGNOSIS — D688 Other specified coagulation defects: Secondary | ICD-10-CM | POA: Diagnosis not present

## 2016-12-04 IMAGING — CR DG CHEST 1V PORT
1 series · 1 of 1 positions shown · non-contrast
Comparison: May 07, 2012

CLINICAL DATA: Hypertension and chronic renal failure. Shortness of
breath

EXAM:
PORTABLE CHEST 1 VIEW

[AP]
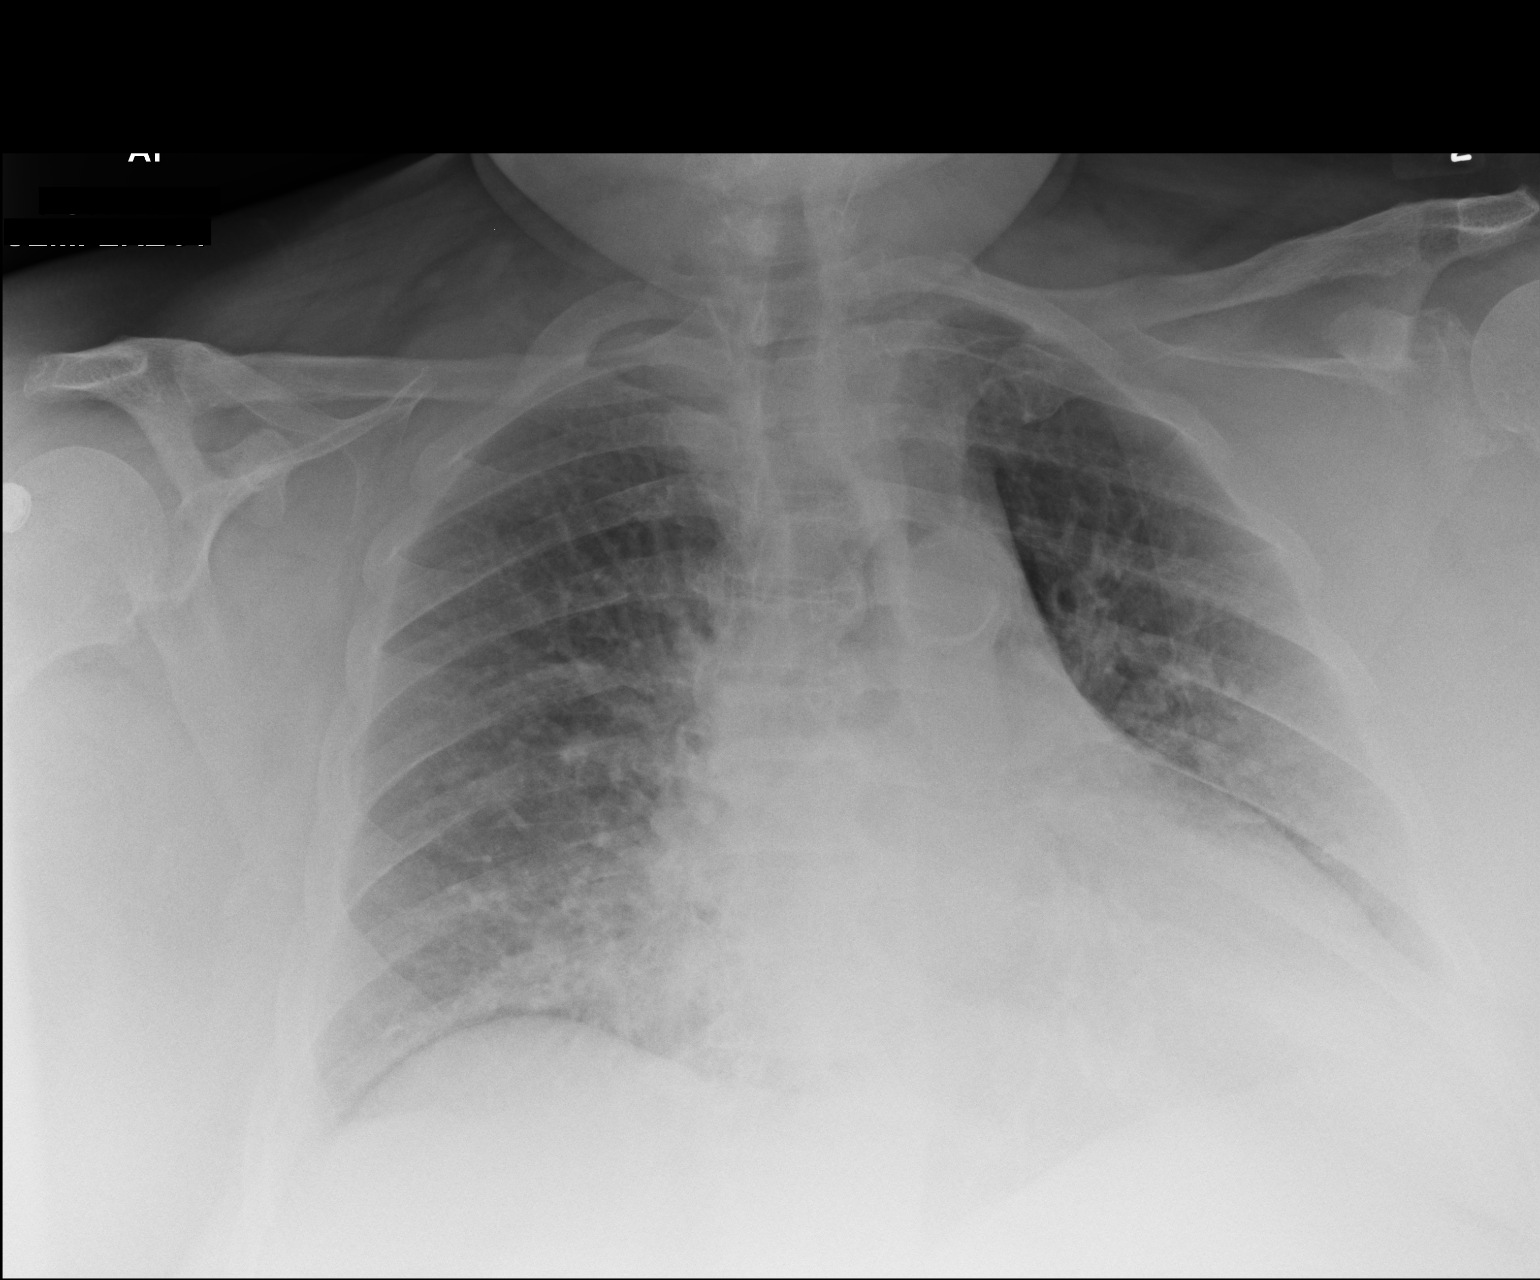

[1 of 1 positions shown; findings below may reference images not displayed]

FINDINGS: There is chronic peribronchial thickening. There is no edema or
consolidation. There is cardiomegaly with pulmonary vascularity
within normal limits. No adenopathy. There is atherosclerotic
calcification in the aortic arch. No adenopathy. No bone lesions.
IMPRESSION: Chronic bronchitis. Mild cardiomegaly. No frank edema or
consolidation. Aortic atherosclerosis.

## 2016-12-05 DIAGNOSIS — I2510 Atherosclerotic heart disease of native coronary artery without angina pectoris: Secondary | ICD-10-CM | POA: Diagnosis not present

## 2016-12-05 DIAGNOSIS — H5461 Unqualified visual loss, right eye, normal vision left eye: Secondary | ICD-10-CM | POA: Diagnosis not present

## 2016-12-05 DIAGNOSIS — I132 Hypertensive heart and chronic kidney disease with heart failure and with stage 5 chronic kidney disease, or end stage renal disease: Secondary | ICD-10-CM | POA: Diagnosis not present

## 2016-12-05 DIAGNOSIS — E782 Mixed hyperlipidemia: Secondary | ICD-10-CM | POA: Diagnosis not present

## 2016-12-05 DIAGNOSIS — E1122 Type 2 diabetes mellitus with diabetic chronic kidney disease: Secondary | ICD-10-CM | POA: Diagnosis not present

## 2016-12-05 DIAGNOSIS — N186 End stage renal disease: Secondary | ICD-10-CM | POA: Diagnosis not present

## 2016-12-05 DIAGNOSIS — M1991 Primary osteoarthritis, unspecified site: Secondary | ICD-10-CM | POA: Diagnosis not present

## 2016-12-05 DIAGNOSIS — I5041 Acute combined systolic (congestive) and diastolic (congestive) heart failure: Secondary | ICD-10-CM | POA: Diagnosis not present

## 2016-12-05 DIAGNOSIS — D631 Anemia in chronic kidney disease: Secondary | ICD-10-CM | POA: Diagnosis not present

## 2016-12-06 DIAGNOSIS — D689 Coagulation defect, unspecified: Secondary | ICD-10-CM | POA: Diagnosis not present

## 2016-12-06 DIAGNOSIS — N186 End stage renal disease: Secondary | ICD-10-CM | POA: Diagnosis not present

## 2016-12-06 DIAGNOSIS — E119 Type 2 diabetes mellitus without complications: Secondary | ICD-10-CM | POA: Diagnosis not present

## 2016-12-06 DIAGNOSIS — E782 Mixed hyperlipidemia: Secondary | ICD-10-CM | POA: Diagnosis not present

## 2016-12-06 DIAGNOSIS — E875 Hyperkalemia: Secondary | ICD-10-CM | POA: Diagnosis not present

## 2016-12-06 DIAGNOSIS — D631 Anemia in chronic kidney disease: Secondary | ICD-10-CM | POA: Diagnosis not present

## 2016-12-06 DIAGNOSIS — D688 Other specified coagulation defects: Secondary | ICD-10-CM | POA: Diagnosis not present

## 2016-12-06 DIAGNOSIS — N2581 Secondary hyperparathyroidism of renal origin: Secondary | ICD-10-CM | POA: Diagnosis not present

## 2016-12-06 DIAGNOSIS — F419 Anxiety disorder, unspecified: Secondary | ICD-10-CM | POA: Diagnosis not present

## 2016-12-07 DIAGNOSIS — E782 Mixed hyperlipidemia: Secondary | ICD-10-CM | POA: Diagnosis not present

## 2016-12-08 DIAGNOSIS — N2581 Secondary hyperparathyroidism of renal origin: Secondary | ICD-10-CM | POA: Diagnosis not present

## 2016-12-08 DIAGNOSIS — E875 Hyperkalemia: Secondary | ICD-10-CM | POA: Diagnosis not present

## 2016-12-08 DIAGNOSIS — F419 Anxiety disorder, unspecified: Secondary | ICD-10-CM | POA: Diagnosis not present

## 2016-12-08 DIAGNOSIS — D688 Other specified coagulation defects: Secondary | ICD-10-CM | POA: Diagnosis not present

## 2016-12-08 DIAGNOSIS — E782 Mixed hyperlipidemia: Secondary | ICD-10-CM | POA: Diagnosis not present

## 2016-12-08 DIAGNOSIS — N186 End stage renal disease: Secondary | ICD-10-CM | POA: Diagnosis not present

## 2016-12-08 DIAGNOSIS — D689 Coagulation defect, unspecified: Secondary | ICD-10-CM | POA: Diagnosis not present

## 2016-12-08 DIAGNOSIS — D631 Anemia in chronic kidney disease: Secondary | ICD-10-CM | POA: Diagnosis not present

## 2016-12-08 DIAGNOSIS — E119 Type 2 diabetes mellitus without complications: Secondary | ICD-10-CM | POA: Diagnosis not present

## 2016-12-09 DIAGNOSIS — E782 Mixed hyperlipidemia: Secondary | ICD-10-CM | POA: Diagnosis not present

## 2016-12-10 DIAGNOSIS — D689 Coagulation defect, unspecified: Secondary | ICD-10-CM | POA: Diagnosis not present

## 2016-12-10 DIAGNOSIS — E119 Type 2 diabetes mellitus without complications: Secondary | ICD-10-CM | POA: Diagnosis not present

## 2016-12-10 DIAGNOSIS — N2581 Secondary hyperparathyroidism of renal origin: Secondary | ICD-10-CM | POA: Diagnosis not present

## 2016-12-10 DIAGNOSIS — D631 Anemia in chronic kidney disease: Secondary | ICD-10-CM | POA: Diagnosis not present

## 2016-12-10 DIAGNOSIS — F419 Anxiety disorder, unspecified: Secondary | ICD-10-CM | POA: Diagnosis not present

## 2016-12-10 DIAGNOSIS — E875 Hyperkalemia: Secondary | ICD-10-CM | POA: Diagnosis not present

## 2016-12-10 DIAGNOSIS — D688 Other specified coagulation defects: Secondary | ICD-10-CM | POA: Diagnosis not present

## 2016-12-10 DIAGNOSIS — E782 Mixed hyperlipidemia: Secondary | ICD-10-CM | POA: Diagnosis not present

## 2016-12-10 DIAGNOSIS — N186 End stage renal disease: Secondary | ICD-10-CM | POA: Diagnosis not present

## 2016-12-12 DIAGNOSIS — E782 Mixed hyperlipidemia: Secondary | ICD-10-CM | POA: Diagnosis not present

## 2016-12-13 DIAGNOSIS — D688 Other specified coagulation defects: Secondary | ICD-10-CM | POA: Diagnosis not present

## 2016-12-13 DIAGNOSIS — N2581 Secondary hyperparathyroidism of renal origin: Secondary | ICD-10-CM | POA: Diagnosis not present

## 2016-12-13 DIAGNOSIS — E782 Mixed hyperlipidemia: Secondary | ICD-10-CM | POA: Diagnosis not present

## 2016-12-13 DIAGNOSIS — E119 Type 2 diabetes mellitus without complications: Secondary | ICD-10-CM | POA: Diagnosis not present

## 2016-12-13 DIAGNOSIS — E875 Hyperkalemia: Secondary | ICD-10-CM | POA: Diagnosis not present

## 2016-12-13 DIAGNOSIS — D689 Coagulation defect, unspecified: Secondary | ICD-10-CM | POA: Diagnosis not present

## 2016-12-13 DIAGNOSIS — F419 Anxiety disorder, unspecified: Secondary | ICD-10-CM | POA: Diagnosis not present

## 2016-12-13 DIAGNOSIS — N186 End stage renal disease: Secondary | ICD-10-CM | POA: Diagnosis not present

## 2016-12-13 DIAGNOSIS — D631 Anemia in chronic kidney disease: Secondary | ICD-10-CM | POA: Diagnosis not present

## 2016-12-14 DIAGNOSIS — E782 Mixed hyperlipidemia: Secondary | ICD-10-CM | POA: Diagnosis not present

## 2016-12-15 DIAGNOSIS — E782 Mixed hyperlipidemia: Secondary | ICD-10-CM | POA: Diagnosis not present

## 2016-12-16 DIAGNOSIS — N186 End stage renal disease: Secondary | ICD-10-CM | POA: Diagnosis not present

## 2016-12-16 DIAGNOSIS — E875 Hyperkalemia: Secondary | ICD-10-CM | POA: Diagnosis not present

## 2016-12-16 DIAGNOSIS — E782 Mixed hyperlipidemia: Secondary | ICD-10-CM | POA: Diagnosis not present

## 2016-12-16 DIAGNOSIS — D631 Anemia in chronic kidney disease: Secondary | ICD-10-CM | POA: Diagnosis not present

## 2016-12-16 DIAGNOSIS — D688 Other specified coagulation defects: Secondary | ICD-10-CM | POA: Diagnosis not present

## 2016-12-16 DIAGNOSIS — D689 Coagulation defect, unspecified: Secondary | ICD-10-CM | POA: Diagnosis not present

## 2016-12-16 DIAGNOSIS — N2581 Secondary hyperparathyroidism of renal origin: Secondary | ICD-10-CM | POA: Diagnosis not present

## 2016-12-16 DIAGNOSIS — E119 Type 2 diabetes mellitus without complications: Secondary | ICD-10-CM | POA: Diagnosis not present

## 2016-12-16 DIAGNOSIS — F419 Anxiety disorder, unspecified: Secondary | ICD-10-CM | POA: Diagnosis not present

## 2016-12-17 DIAGNOSIS — E782 Mixed hyperlipidemia: Secondary | ICD-10-CM | POA: Diagnosis not present

## 2016-12-17 DIAGNOSIS — H40012 Open angle with borderline findings, low risk, left eye: Secondary | ICD-10-CM | POA: Diagnosis not present

## 2016-12-18 DIAGNOSIS — F419 Anxiety disorder, unspecified: Secondary | ICD-10-CM | POA: Diagnosis not present

## 2016-12-18 DIAGNOSIS — D688 Other specified coagulation defects: Secondary | ICD-10-CM | POA: Diagnosis not present

## 2016-12-18 DIAGNOSIS — E119 Type 2 diabetes mellitus without complications: Secondary | ICD-10-CM | POA: Diagnosis not present

## 2016-12-18 DIAGNOSIS — Z992 Dependence on renal dialysis: Secondary | ICD-10-CM | POA: Diagnosis not present

## 2016-12-18 DIAGNOSIS — N186 End stage renal disease: Secondary | ICD-10-CM | POA: Diagnosis not present

## 2016-12-18 DIAGNOSIS — E782 Mixed hyperlipidemia: Secondary | ICD-10-CM | POA: Diagnosis not present

## 2016-12-18 DIAGNOSIS — D631 Anemia in chronic kidney disease: Secondary | ICD-10-CM | POA: Diagnosis not present

## 2016-12-18 DIAGNOSIS — N2581 Secondary hyperparathyroidism of renal origin: Secondary | ICD-10-CM | POA: Diagnosis not present

## 2016-12-18 DIAGNOSIS — T8249XA Other complication of vascular dialysis catheter, initial encounter: Secondary | ICD-10-CM | POA: Diagnosis not present

## 2016-12-18 DIAGNOSIS — D689 Coagulation defect, unspecified: Secondary | ICD-10-CM | POA: Diagnosis not present

## 2016-12-18 DIAGNOSIS — E875 Hyperkalemia: Secondary | ICD-10-CM | POA: Diagnosis not present

## 2016-12-19 DIAGNOSIS — Z992 Dependence on renal dialysis: Secondary | ICD-10-CM | POA: Diagnosis not present

## 2016-12-19 DIAGNOSIS — E782 Mixed hyperlipidemia: Secondary | ICD-10-CM | POA: Diagnosis not present

## 2016-12-19 DIAGNOSIS — N186 End stage renal disease: Secondary | ICD-10-CM | POA: Diagnosis not present

## 2016-12-19 DIAGNOSIS — E1129 Type 2 diabetes mellitus with other diabetic kidney complication: Secondary | ICD-10-CM | POA: Diagnosis not present

## 2016-12-20 DIAGNOSIS — D631 Anemia in chronic kidney disease: Secondary | ICD-10-CM | POA: Diagnosis not present

## 2016-12-20 DIAGNOSIS — N186 End stage renal disease: Secondary | ICD-10-CM | POA: Diagnosis not present

## 2016-12-20 DIAGNOSIS — N2581 Secondary hyperparathyroidism of renal origin: Secondary | ICD-10-CM | POA: Diagnosis not present

## 2016-12-20 DIAGNOSIS — E119 Type 2 diabetes mellitus without complications: Secondary | ICD-10-CM | POA: Diagnosis not present

## 2016-12-20 DIAGNOSIS — D689 Coagulation defect, unspecified: Secondary | ICD-10-CM | POA: Diagnosis not present

## 2016-12-20 DIAGNOSIS — E875 Hyperkalemia: Secondary | ICD-10-CM | POA: Diagnosis not present

## 2016-12-21 DIAGNOSIS — I270 Primary pulmonary hypertension: Secondary | ICD-10-CM | POA: Diagnosis not present

## 2016-12-21 DIAGNOSIS — G4733 Obstructive sleep apnea (adult) (pediatric): Secondary | ICD-10-CM | POA: Diagnosis not present

## 2016-12-21 DIAGNOSIS — R0901 Asphyxia: Secondary | ICD-10-CM | POA: Diagnosis not present

## 2016-12-21 DIAGNOSIS — J449 Chronic obstructive pulmonary disease, unspecified: Secondary | ICD-10-CM | POA: Diagnosis not present

## 2016-12-21 DIAGNOSIS — I5043 Acute on chronic combined systolic (congestive) and diastolic (congestive) heart failure: Secondary | ICD-10-CM | POA: Diagnosis not present

## 2016-12-21 DIAGNOSIS — R0602 Shortness of breath: Secondary | ICD-10-CM | POA: Diagnosis not present

## 2016-12-22 DIAGNOSIS — E782 Mixed hyperlipidemia: Secondary | ICD-10-CM | POA: Diagnosis not present

## 2016-12-23 DIAGNOSIS — N186 End stage renal disease: Secondary | ICD-10-CM | POA: Diagnosis not present

## 2016-12-23 DIAGNOSIS — E782 Mixed hyperlipidemia: Secondary | ICD-10-CM | POA: Diagnosis not present

## 2016-12-23 DIAGNOSIS — E119 Type 2 diabetes mellitus without complications: Secondary | ICD-10-CM | POA: Diagnosis not present

## 2016-12-23 DIAGNOSIS — N2581 Secondary hyperparathyroidism of renal origin: Secondary | ICD-10-CM | POA: Diagnosis not present

## 2016-12-23 DIAGNOSIS — E875 Hyperkalemia: Secondary | ICD-10-CM | POA: Diagnosis not present

## 2016-12-23 DIAGNOSIS — D689 Coagulation defect, unspecified: Secondary | ICD-10-CM | POA: Diagnosis not present

## 2016-12-23 DIAGNOSIS — D631 Anemia in chronic kidney disease: Secondary | ICD-10-CM | POA: Diagnosis not present

## 2016-12-24 ENCOUNTER — Other Ambulatory Visit: Payer: Self-pay

## 2016-12-24 DIAGNOSIS — E782 Mixed hyperlipidemia: Secondary | ICD-10-CM | POA: Diagnosis not present

## 2016-12-24 NOTE — Patient Outreach (Signed)
[facility]) Care Management  12/24/2016  [name] 1945-08-28 [id number]   Subjective: "oh, I am doing fine. I am in the green zone".  Objective: none  Assessment: 71 year old with recent admission 9/24-10/2 for heart failure. History of ESRD/HD, diabetes, COPD  RNCM called to follow up. Client denies any questions or concerns. She reports she continues to attend dialysis sessions, she denies any shortness of breath or weight gain.  Client is without questions or concerns.  RNCM discussed health coach for disease management. Client declines.  Case closure discussed and client is in agreement.  RNCM encouraged client to call as needed. RNCM reinforced 24 hour nurse advice line as needed.  Plan: close case.  [name], RN, MSN, [place] Coordinator Cell: [phone]

## 2016-12-25 ENCOUNTER — Ambulatory Visit (HOSPITAL_BASED_OUTPATIENT_CLINIC_OR_DEPARTMENT_OTHER): Payer: Medicare HMO | Attending: Pulmonary Disease | Admitting: Pulmonary Disease

## 2016-12-25 VITALS — Ht 67.0 in | Wt 272.0 lb

## 2016-12-25 DIAGNOSIS — D631 Anemia in chronic kidney disease: Secondary | ICD-10-CM | POA: Diagnosis not present

## 2016-12-25 DIAGNOSIS — G4733 Obstructive sleep apnea (adult) (pediatric): Secondary | ICD-10-CM | POA: Diagnosis not present

## 2016-12-25 DIAGNOSIS — N186 End stage renal disease: Secondary | ICD-10-CM | POA: Diagnosis not present

## 2016-12-25 DIAGNOSIS — N2581 Secondary hyperparathyroidism of renal origin: Secondary | ICD-10-CM | POA: Diagnosis not present

## 2016-12-25 DIAGNOSIS — E782 Mixed hyperlipidemia: Secondary | ICD-10-CM | POA: Diagnosis not present

## 2016-12-25 DIAGNOSIS — D689 Coagulation defect, unspecified: Secondary | ICD-10-CM | POA: Diagnosis not present

## 2016-12-25 DIAGNOSIS — Z9989 Dependence on other enabling machines and devices: Secondary | ICD-10-CM

## 2016-12-25 DIAGNOSIS — E119 Type 2 diabetes mellitus without complications: Secondary | ICD-10-CM | POA: Diagnosis not present

## 2016-12-25 DIAGNOSIS — E875 Hyperkalemia: Secondary | ICD-10-CM | POA: Diagnosis not present

## 2016-12-26 DIAGNOSIS — G4710 Hypersomnia, unspecified: Secondary | ICD-10-CM | POA: Diagnosis not present

## 2016-12-26 DIAGNOSIS — E782 Mixed hyperlipidemia: Secondary | ICD-10-CM | POA: Diagnosis not present

## 2016-12-26 NOTE — Procedures (Signed)
Patient Name: [name], [name] Date: 12/25/2016 Gender: Female D.O.B: October 28, 1945 Age (years): 28 Referring Provider: Marshell Garfinkel Height (inches): 66 Interpreting Physician: Kara Mead MD, ABSM Weight (lbs): 272 RPSGT: Baxter Flattery BMI: 37 MRN: [id number] Neck Size: 16.00   CLINICAL INFORMATION Sleep Study Type: NPSG  Indication for sleep study: Congestive Heart Failure, Diabetes, Obesity, OSA, Snoring, Witnessed Apneas  Epworth Sleepiness Score: 1  SLEEP STUDY TECHNIQUE As per the AASM Manual for the Scoring of Sleep and Associated Events v2.3 (April 2016) with a hypopnea requiring 4% desaturations.  The channels recorded and monitored were frontal, central and occipital EEG, electrooculogram (EOG), submentalis EMG (chin), nasal and oral airflow, thoracic and abdominal wall motion, anterior tibialis EMG, snore microphone, electrocardiogram, and pulse oximetry.  SLEEP ARCHITECTURE The study was initiated at 9:57:24 PM and ended at 4:39:29 AM.  Sleep onset time was 6.8 minutes and the sleep efficiency was 93.8%. The total sleep time was 377.3 minutes.  Stage REM latency was 6.5 minutes.  The patient spent 2.78% of the night in stage N1 sleep, 68.99% in stage N2 sleep, 0.00% in stage N3 and 28.23% in REM.  Alpha intrusion was absent.  Supine sleep was 0.00%.  RESPIRATORY PARAMETERS The overall apnea/hypopnea index (AHI) was 7.8 per hour. There were 38 total apneas, including 38 obstructive, 0 central and 0 mixed apneas. There were 11 hypopneas and 0 RERAs.  The AHI during Stage REM sleep was 19.2 per hour.  AHI while supine was N/A per hour.  The mean oxygen saturation was 95.87%. The minimum SpO2 during sleep was 92.00%.  loud snoring was noted during this study.  CARDIAC DATA The 2 lead EKG demonstrated sinus rhythm. The mean heart rate was 66.08 beats per minute. Other EKG findings include: None.   LEG MOVEMENT DATA The total PLMS were 0 with a resulting  PLMS index of 0.00. Associated arousal with leg movement index was 0.0 .  IMPRESSIONS - Mild obstructive sleep apnea occurred during this study (AHI = 7.8/h). No supine sleep was noted. She slept on her left side throughout the study - No significant central sleep apnea occurred during this study (CAI = 0.0/h). - The patient had minimal or no oxygen desaturation during the study (Min O2 = 92.00%). Study was performed on 2L O2 - The patient snored with loud snoring volume. - No cardiac abnormalities were noted during this study. - Clinically significant periodic limb movements did not occur during sleep. No significant associated arousals.   DIAGNOSIS - Obstructive Sleep Apnea (327.23 [G47.33 ICD-10])   RECOMMENDATIONS - Very mild obstructive sleep apnea. Return to discuss treatment options. Cardiovascular implications are minimal - Avoid alcohol, sedatives and other CNS depressants that may worsen sleep apnea and disrupt normal sleep architecture. - Sleep hygiene should be reviewed to assess factors that may improve sleep quality. - Weight management and regular exercise should be initiated or continued if appropriate.    Kara Mead MD Board Certified in [place]

## 2016-12-27 DIAGNOSIS — E875 Hyperkalemia: Secondary | ICD-10-CM | POA: Diagnosis not present

## 2016-12-27 DIAGNOSIS — D689 Coagulation defect, unspecified: Secondary | ICD-10-CM | POA: Diagnosis not present

## 2016-12-27 DIAGNOSIS — E119 Type 2 diabetes mellitus without complications: Secondary | ICD-10-CM | POA: Diagnosis not present

## 2016-12-27 DIAGNOSIS — N186 End stage renal disease: Secondary | ICD-10-CM | POA: Diagnosis not present

## 2016-12-27 DIAGNOSIS — N2581 Secondary hyperparathyroidism of renal origin: Secondary | ICD-10-CM | POA: Diagnosis not present

## 2016-12-27 DIAGNOSIS — D631 Anemia in chronic kidney disease: Secondary | ICD-10-CM | POA: Diagnosis not present

## 2016-12-29 ENCOUNTER — Ambulatory Visit: Payer: Medicare HMO | Admitting: Surgery

## 2016-12-29 DIAGNOSIS — E782 Mixed hyperlipidemia: Secondary | ICD-10-CM | POA: Diagnosis not present

## 2016-12-30 DIAGNOSIS — N2581 Secondary hyperparathyroidism of renal origin: Secondary | ICD-10-CM | POA: Diagnosis not present

## 2016-12-30 DIAGNOSIS — D689 Coagulation defect, unspecified: Secondary | ICD-10-CM | POA: Diagnosis not present

## 2016-12-30 DIAGNOSIS — D631 Anemia in chronic kidney disease: Secondary | ICD-10-CM | POA: Diagnosis not present

## 2016-12-30 DIAGNOSIS — N186 End stage renal disease: Secondary | ICD-10-CM | POA: Diagnosis not present

## 2016-12-30 DIAGNOSIS — E875 Hyperkalemia: Secondary | ICD-10-CM | POA: Diagnosis not present

## 2016-12-30 DIAGNOSIS — E782 Mixed hyperlipidemia: Secondary | ICD-10-CM | POA: Diagnosis not present

## 2016-12-30 DIAGNOSIS — E119 Type 2 diabetes mellitus without complications: Secondary | ICD-10-CM | POA: Diagnosis not present

## 2016-12-31 DIAGNOSIS — E782 Mixed hyperlipidemia: Secondary | ICD-10-CM | POA: Diagnosis not present

## 2017-01-01 DIAGNOSIS — E119 Type 2 diabetes mellitus without complications: Secondary | ICD-10-CM | POA: Diagnosis not present

## 2017-01-01 DIAGNOSIS — E782 Mixed hyperlipidemia: Secondary | ICD-10-CM | POA: Diagnosis not present

## 2017-01-01 DIAGNOSIS — D631 Anemia in chronic kidney disease: Secondary | ICD-10-CM | POA: Diagnosis not present

## 2017-01-01 DIAGNOSIS — E875 Hyperkalemia: Secondary | ICD-10-CM | POA: Diagnosis not present

## 2017-01-01 DIAGNOSIS — N2581 Secondary hyperparathyroidism of renal origin: Secondary | ICD-10-CM | POA: Diagnosis not present

## 2017-01-01 DIAGNOSIS — N186 End stage renal disease: Secondary | ICD-10-CM | POA: Diagnosis not present

## 2017-01-01 DIAGNOSIS — D689 Coagulation defect, unspecified: Secondary | ICD-10-CM | POA: Diagnosis not present

## 2017-01-02 DIAGNOSIS — E782 Mixed hyperlipidemia: Secondary | ICD-10-CM | POA: Diagnosis not present

## 2017-01-03 DIAGNOSIS — N2581 Secondary hyperparathyroidism of renal origin: Secondary | ICD-10-CM | POA: Diagnosis not present

## 2017-01-03 DIAGNOSIS — E119 Type 2 diabetes mellitus without complications: Secondary | ICD-10-CM | POA: Diagnosis not present

## 2017-01-03 DIAGNOSIS — E875 Hyperkalemia: Secondary | ICD-10-CM | POA: Diagnosis not present

## 2017-01-03 DIAGNOSIS — N186 End stage renal disease: Secondary | ICD-10-CM | POA: Diagnosis not present

## 2017-01-03 DIAGNOSIS — D689 Coagulation defect, unspecified: Secondary | ICD-10-CM | POA: Diagnosis not present

## 2017-01-03 DIAGNOSIS — D631 Anemia in chronic kidney disease: Secondary | ICD-10-CM | POA: Diagnosis not present

## 2017-01-05 DIAGNOSIS — E782 Mixed hyperlipidemia: Secondary | ICD-10-CM | POA: Diagnosis not present

## 2017-01-06 DIAGNOSIS — D689 Coagulation defect, unspecified: Secondary | ICD-10-CM | POA: Diagnosis not present

## 2017-01-06 DIAGNOSIS — E782 Mixed hyperlipidemia: Secondary | ICD-10-CM | POA: Diagnosis not present

## 2017-01-06 DIAGNOSIS — E875 Hyperkalemia: Secondary | ICD-10-CM | POA: Diagnosis not present

## 2017-01-06 DIAGNOSIS — N2581 Secondary hyperparathyroidism of renal origin: Secondary | ICD-10-CM | POA: Diagnosis not present

## 2017-01-06 DIAGNOSIS — N186 End stage renal disease: Secondary | ICD-10-CM | POA: Diagnosis not present

## 2017-01-06 DIAGNOSIS — D631 Anemia in chronic kidney disease: Secondary | ICD-10-CM | POA: Diagnosis not present

## 2017-01-06 DIAGNOSIS — E119 Type 2 diabetes mellitus without complications: Secondary | ICD-10-CM | POA: Diagnosis not present

## 2017-01-07 DIAGNOSIS — E782 Mixed hyperlipidemia: Secondary | ICD-10-CM | POA: Diagnosis not present

## 2017-01-08 DIAGNOSIS — E782 Mixed hyperlipidemia: Secondary | ICD-10-CM | POA: Diagnosis not present

## 2017-01-08 DIAGNOSIS — D689 Coagulation defect, unspecified: Secondary | ICD-10-CM | POA: Diagnosis not present

## 2017-01-08 DIAGNOSIS — N186 End stage renal disease: Secondary | ICD-10-CM | POA: Diagnosis not present

## 2017-01-08 DIAGNOSIS — E875 Hyperkalemia: Secondary | ICD-10-CM | POA: Diagnosis not present

## 2017-01-08 DIAGNOSIS — E119 Type 2 diabetes mellitus without complications: Secondary | ICD-10-CM | POA: Diagnosis not present

## 2017-01-08 DIAGNOSIS — N2581 Secondary hyperparathyroidism of renal origin: Secondary | ICD-10-CM | POA: Diagnosis not present

## 2017-01-08 DIAGNOSIS — D631 Anemia in chronic kidney disease: Secondary | ICD-10-CM | POA: Diagnosis not present

## 2017-01-09 DIAGNOSIS — E782 Mixed hyperlipidemia: Secondary | ICD-10-CM | POA: Diagnosis not present

## 2017-01-10 DIAGNOSIS — E119 Type 2 diabetes mellitus without complications: Secondary | ICD-10-CM | POA: Diagnosis not present

## 2017-01-10 DIAGNOSIS — N186 End stage renal disease: Secondary | ICD-10-CM | POA: Diagnosis not present

## 2017-01-10 DIAGNOSIS — D631 Anemia in chronic kidney disease: Secondary | ICD-10-CM | POA: Diagnosis not present

## 2017-01-10 DIAGNOSIS — N2581 Secondary hyperparathyroidism of renal origin: Secondary | ICD-10-CM | POA: Diagnosis not present

## 2017-01-10 DIAGNOSIS — E875 Hyperkalemia: Secondary | ICD-10-CM | POA: Diagnosis not present

## 2017-01-10 DIAGNOSIS — D689 Coagulation defect, unspecified: Secondary | ICD-10-CM | POA: Diagnosis not present

## 2017-01-12 ENCOUNTER — Ambulatory Visit: Payer: Medicare HMO | Admitting: Surgery

## 2017-01-12 DIAGNOSIS — E782 Mixed hyperlipidemia: Secondary | ICD-10-CM | POA: Diagnosis not present

## 2017-01-12 DIAGNOSIS — E119 Type 2 diabetes mellitus without complications: Secondary | ICD-10-CM | POA: Diagnosis not present

## 2017-01-12 DIAGNOSIS — D631 Anemia in chronic kidney disease: Secondary | ICD-10-CM | POA: Diagnosis not present

## 2017-01-12 DIAGNOSIS — N2581 Secondary hyperparathyroidism of renal origin: Secondary | ICD-10-CM | POA: Diagnosis not present

## 2017-01-12 DIAGNOSIS — D689 Coagulation defect, unspecified: Secondary | ICD-10-CM | POA: Diagnosis not present

## 2017-01-12 DIAGNOSIS — E875 Hyperkalemia: Secondary | ICD-10-CM | POA: Diagnosis not present

## 2017-01-12 DIAGNOSIS — N186 End stage renal disease: Secondary | ICD-10-CM | POA: Diagnosis not present

## 2017-01-14 DIAGNOSIS — E782 Mixed hyperlipidemia: Secondary | ICD-10-CM | POA: Diagnosis not present

## 2017-01-15 DIAGNOSIS — N186 End stage renal disease: Secondary | ICD-10-CM | POA: Diagnosis not present

## 2017-01-15 DIAGNOSIS — E875 Hyperkalemia: Secondary | ICD-10-CM | POA: Diagnosis not present

## 2017-01-15 DIAGNOSIS — N2581 Secondary hyperparathyroidism of renal origin: Secondary | ICD-10-CM | POA: Diagnosis not present

## 2017-01-15 DIAGNOSIS — D631 Anemia in chronic kidney disease: Secondary | ICD-10-CM | POA: Diagnosis not present

## 2017-01-15 DIAGNOSIS — D689 Coagulation defect, unspecified: Secondary | ICD-10-CM | POA: Diagnosis not present

## 2017-01-15 DIAGNOSIS — E119 Type 2 diabetes mellitus without complications: Secondary | ICD-10-CM | POA: Diagnosis not present

## 2017-01-15 DIAGNOSIS — E782 Mixed hyperlipidemia: Secondary | ICD-10-CM | POA: Diagnosis not present

## 2017-01-16 DIAGNOSIS — E782 Mixed hyperlipidemia: Secondary | ICD-10-CM | POA: Diagnosis not present

## 2017-01-17 DIAGNOSIS — E875 Hyperkalemia: Secondary | ICD-10-CM | POA: Diagnosis not present

## 2017-01-17 DIAGNOSIS — N2581 Secondary hyperparathyroidism of renal origin: Secondary | ICD-10-CM | POA: Diagnosis not present

## 2017-01-17 DIAGNOSIS — N186 End stage renal disease: Secondary | ICD-10-CM | POA: Diagnosis not present

## 2017-01-17 DIAGNOSIS — D631 Anemia in chronic kidney disease: Secondary | ICD-10-CM | POA: Diagnosis not present

## 2017-01-17 DIAGNOSIS — E119 Type 2 diabetes mellitus without complications: Secondary | ICD-10-CM | POA: Diagnosis not present

## 2017-01-17 DIAGNOSIS — D689 Coagulation defect, unspecified: Secondary | ICD-10-CM | POA: Diagnosis not present

## 2017-01-19 DIAGNOSIS — D689 Coagulation defect, unspecified: Secondary | ICD-10-CM | POA: Diagnosis not present

## 2017-01-19 DIAGNOSIS — E875 Hyperkalemia: Secondary | ICD-10-CM | POA: Diagnosis not present

## 2017-01-19 DIAGNOSIS — N186 End stage renal disease: Secondary | ICD-10-CM | POA: Diagnosis not present

## 2017-01-19 DIAGNOSIS — Z992 Dependence on renal dialysis: Secondary | ICD-10-CM | POA: Diagnosis not present

## 2017-01-19 DIAGNOSIS — E119 Type 2 diabetes mellitus without complications: Secondary | ICD-10-CM | POA: Diagnosis not present

## 2017-01-19 DIAGNOSIS — E1129 Type 2 diabetes mellitus with other diabetic kidney complication: Secondary | ICD-10-CM | POA: Diagnosis not present

## 2017-01-19 DIAGNOSIS — D631 Anemia in chronic kidney disease: Secondary | ICD-10-CM | POA: Diagnosis not present

## 2017-01-19 DIAGNOSIS — N2581 Secondary hyperparathyroidism of renal origin: Secondary | ICD-10-CM | POA: Diagnosis not present

## 2017-01-19 DIAGNOSIS — E782 Mixed hyperlipidemia: Secondary | ICD-10-CM | POA: Diagnosis not present

## 2017-01-21 DIAGNOSIS — E782 Mixed hyperlipidemia: Secondary | ICD-10-CM | POA: Diagnosis not present

## 2017-01-21 DIAGNOSIS — R0901 Asphyxia: Secondary | ICD-10-CM | POA: Diagnosis not present

## 2017-01-21 DIAGNOSIS — R0602 Shortness of breath: Secondary | ICD-10-CM | POA: Diagnosis not present

## 2017-01-21 DIAGNOSIS — I5043 Acute on chronic combined systolic (congestive) and diastolic (congestive) heart failure: Secondary | ICD-10-CM | POA: Diagnosis not present

## 2017-01-21 DIAGNOSIS — J449 Chronic obstructive pulmonary disease, unspecified: Secondary | ICD-10-CM | POA: Diagnosis not present

## 2017-01-21 DIAGNOSIS — G4733 Obstructive sleep apnea (adult) (pediatric): Secondary | ICD-10-CM | POA: Diagnosis not present

## 2017-01-21 DIAGNOSIS — I270 Primary pulmonary hypertension: Secondary | ICD-10-CM | POA: Diagnosis not present

## 2017-01-22 DIAGNOSIS — E782 Mixed hyperlipidemia: Secondary | ICD-10-CM | POA: Diagnosis not present

## 2017-01-22 DIAGNOSIS — N2581 Secondary hyperparathyroidism of renal origin: Secondary | ICD-10-CM | POA: Diagnosis not present

## 2017-01-22 DIAGNOSIS — N186 End stage renal disease: Secondary | ICD-10-CM | POA: Diagnosis not present

## 2017-01-23 DIAGNOSIS — M109 Gout, unspecified: Secondary | ICD-10-CM | POA: Diagnosis not present

## 2017-01-23 DIAGNOSIS — J449 Chronic obstructive pulmonary disease, unspecified: Secondary | ICD-10-CM | POA: Diagnosis not present

## 2017-01-23 DIAGNOSIS — I10 Essential (primary) hypertension: Secondary | ICD-10-CM | POA: Diagnosis not present

## 2017-01-23 DIAGNOSIS — E1140 Type 2 diabetes mellitus with diabetic neuropathy, unspecified: Secondary | ICD-10-CM | POA: Diagnosis not present

## 2017-01-23 DIAGNOSIS — I509 Heart failure, unspecified: Secondary | ICD-10-CM | POA: Diagnosis not present

## 2017-01-23 DIAGNOSIS — E782 Mixed hyperlipidemia: Secondary | ICD-10-CM | POA: Diagnosis not present

## 2017-01-23 DIAGNOSIS — G4733 Obstructive sleep apnea (adult) (pediatric): Secondary | ICD-10-CM | POA: Diagnosis not present

## 2017-01-23 DIAGNOSIS — Z992 Dependence on renal dialysis: Secondary | ICD-10-CM | POA: Diagnosis not present

## 2017-01-23 DIAGNOSIS — N186 End stage renal disease: Secondary | ICD-10-CM | POA: Diagnosis not present

## 2017-01-23 DIAGNOSIS — E7849 Other hyperlipidemia: Secondary | ICD-10-CM | POA: Diagnosis not present

## 2017-01-23 DIAGNOSIS — Z23 Encounter for immunization: Secondary | ICD-10-CM | POA: Diagnosis not present

## 2017-01-24 DIAGNOSIS — N2581 Secondary hyperparathyroidism of renal origin: Secondary | ICD-10-CM | POA: Diagnosis not present

## 2017-01-24 DIAGNOSIS — N186 End stage renal disease: Secondary | ICD-10-CM | POA: Diagnosis not present

## 2017-01-26 DIAGNOSIS — E782 Mixed hyperlipidemia: Secondary | ICD-10-CM | POA: Diagnosis not present

## 2017-01-27 DIAGNOSIS — N186 End stage renal disease: Secondary | ICD-10-CM | POA: Diagnosis not present

## 2017-01-27 DIAGNOSIS — N2581 Secondary hyperparathyroidism of renal origin: Secondary | ICD-10-CM | POA: Diagnosis not present

## 2017-01-27 DIAGNOSIS — E782 Mixed hyperlipidemia: Secondary | ICD-10-CM | POA: Diagnosis not present

## 2017-01-28 ENCOUNTER — Other Ambulatory Visit: Payer: Medicare HMO

## 2017-01-28 DIAGNOSIS — E782 Mixed hyperlipidemia: Secondary | ICD-10-CM | POA: Diagnosis not present

## 2017-01-29 DIAGNOSIS — N186 End stage renal disease: Secondary | ICD-10-CM | POA: Diagnosis not present

## 2017-01-29 DIAGNOSIS — E782 Mixed hyperlipidemia: Secondary | ICD-10-CM | POA: Diagnosis not present

## 2017-01-29 DIAGNOSIS — N2581 Secondary hyperparathyroidism of renal origin: Secondary | ICD-10-CM | POA: Diagnosis not present

## 2017-01-30 DIAGNOSIS — E782 Mixed hyperlipidemia: Secondary | ICD-10-CM | POA: Diagnosis not present

## 2017-01-31 DIAGNOSIS — N186 End stage renal disease: Secondary | ICD-10-CM | POA: Diagnosis not present

## 2017-01-31 DIAGNOSIS — N2581 Secondary hyperparathyroidism of renal origin: Secondary | ICD-10-CM | POA: Diagnosis not present

## 2017-02-02 ENCOUNTER — Encounter: Payer: Self-pay | Admitting: Surgery

## 2017-02-02 ENCOUNTER — Encounter: Payer: Self-pay | Admitting: *Deleted

## 2017-02-02 ENCOUNTER — Ambulatory Visit (INDEPENDENT_AMBULATORY_CARE_PROVIDER_SITE_OTHER): Payer: Medicare HMO | Admitting: Surgery

## 2017-02-02 ENCOUNTER — Other Ambulatory Visit: Payer: Self-pay

## 2017-02-02 ENCOUNTER — Ambulatory Visit (HOSPITAL_COMMUNITY)
Admission: RE | Admit: 2017-02-02 | Discharge: 2017-02-02 | Disposition: A | Discharge status: Home / Self Care | Payer: Medicare HMO | Source: Ambulatory Visit | Attending: Surgery | Admitting: Surgery

## 2017-02-02 ENCOUNTER — Other Ambulatory Visit: Payer: Self-pay | Admitting: *Deleted

## 2017-02-02 VITALS — BP 159/75 | HR 68 | Temp 97.0°F | Resp 18 | Ht 67.0 in | Wt 278.0 lb

## 2017-02-02 DIAGNOSIS — N186 End stage renal disease: Secondary | ICD-10-CM

## 2017-02-02 DIAGNOSIS — N184 Chronic kidney disease, stage 4 (severe): Secondary | ICD-10-CM | POA: Insufficient documentation

## 2017-02-02 DIAGNOSIS — Z992 Dependence on renal dialysis: Secondary | ICD-10-CM | POA: Diagnosis not present

## 2017-02-02 DIAGNOSIS — E782 Mixed hyperlipidemia: Secondary | ICD-10-CM | POA: Diagnosis not present

## 2017-02-02 NOTE — Progress Notes (Signed)
Vascular and Vein Specialist of [facility]  Patient name: [name] MRN: [id number] DOB: [birth year] Sex: female   REASON FOR VISIT:    Follow up  HISOTRY OF PRESENT ILLNESS:    [name] is a 72 y.o. female who is status post a stage basilic vein fistula creation in 2014 by Dr. Geryl Councilman.  The patient is having difficulty with cannulation's and has had multiple infiltrations recently.  She recently had a catheter placed.  She is sent her over today for further evaluation.   PAST MEDICAL HISTORY:   Past Medical History:  Diagnosis Date  . Abdominal abscess 12-17-10   abdominal abscesses x2 ? one at this time  . Anemia   . Arthritis    "all over" (08/23/2012)  . Blind right eye    fell and crushed socket and eye fell out, was replaced at [facility]  . CHF (congestive heart failure) ([place])   . Chronic lower back pain   . Coronary artery disease    Dr Kadakia-cardiologist 2-3 x year  . Dyspnea   . Dysrhythmia    irregular, "skips beats"  . ESRD (end stage renal disease) on dialysis ([place])    "started 12/2011; [place]; TTS" (08/23/2012  . Exertional shortness of breath   . Eye drainage    "lots; since fall 07/2011" (08/23/2012)  . Gout   . H/O hiatal hernia   . Headache(784.0)    "weekly sometimes; since I fell and hit my head in 07/2011 08/23/2012)  . Heart murmur   . History of blood transfusion    "lots before starting dialysis" (08/23/2012)  . Hypertension    sees Dr. Willey Blade  . Inhalation injury    "worked at [employer]; can't inhale polyurethane or paint, etc" (08/23/2012)  . Myocardial infarction ([place]) 1970's or 1980's  . Neuromuscular disorder ([place])    diabetic neuropathy  . OSA on CPAP   . Staph aureus infection November 2012   . Swelling of both ankles 12-17-10  . Type II diabetes mellitus ([place])    "over 10 years now" (08/23/2012)     FAMILY HISTORY:   Family History  Problem Relation Age of Onset  .  Hypertension Mother   . Cancer Brother        spine  . Hypertension Father   . Cancer Sister        brain  . Hypertension Maternal Grandmother   . Anesthesia problems Neg Hx     SOCIAL HISTORY:   Social History   Tobacco Use  . Smoking status: Former Smoker    Packs/day: 0.25    Years: 16.00    Pack years: 4.00    Types: Cigarettes    Last attempt to quit: 12/16/1980    Years since quitting: 36.1  . Smokeless tobacco: Never Used  Substance Use Topics  . Alcohol use: No     ALLERGIES:   No Known Allergies   CURRENT MEDICATIONS:   Current Outpatient Medications  Medication Sig Dispense Refill  . albuterol (PROVENTIL HFA;VENTOLIN HFA) 108 (90 BASE) MCG/ACT inhaler Inhale 2 puffs into the lungs every 4 (four) hours as needed for wheezing or shortness of breath.    Marland Kitchen albuterol (PROVENTIL) (2.5 MG/3ML) 0.083% nebulizer solution Take 3 mLs (2.5 mg total) by nebulization every 2 (two) hours as needed for shortness of breath. 75 mL 0  . aspirin EC 81 MG tablet Take 81 mg by mouth daily.      Marland Kitchen atorvastatin (LIPITOR)  40 MG tablet Take 40 mg by mouth at bedtime.     . bisacodyl (DULCOLAX) 5 MG EC tablet Take 10 mg by mouth daily as needed for moderate constipation.     . Calcium Carbonate-Vitamin D (CALCIUM 600 + D PO) Take 1 tablet by mouth daily.     . carvedilol (COREG) 25 MG tablet Take 1 tablet (25 mg) by mouth twice daily - first dose in the afternoon, second dose at bedtime.    . cinacalcet (SENSIPAR) 90 MG tablet Take 1 tablet (90 mg) by mouth daily. On dialysis days, take the dose after dialysis    . diphenhydrAMINE (BENADRYL) 25 mg capsule Take 50 mg by mouth See admin instructions. Take 2 tablets (50 mg) by mouth prior to dialysis on Tuesday, Thursday, Saturday - for itching    . ethyl chloride spray Apply 1 application topically See admin instructions. Apply topically before dialysis on Tuesday, Thursday, Saturday  5  . HYDROcodone-acetaminophen (NORCO/VICODIN) 5-325  MG tablet Take 1 tablet by mouth 2 (two) times daily as needed (knee pain).     . insulin glargine (LANTUS) 100 UNIT/ML injection Inject 0.1 mLs (10 Units total) into the skin at bedtime. 10 mL 0  . midodrine (PROAMATINE) 10 MG tablet Take 1 tablet by mouth twice daily at 4AM and 9AM. On dialysis days, take first dose before dialysis and second dose mid-way through dialysis.    . Multiple Vitamin (MULITIVITAMIN WITH MINERALS) TABS Take 1 tablet by mouth daily.    . sevelamer carbonate (RENVELA) 800 MG tablet Take 1 tablet (800 mg total) by mouth 3 (three) times daily with meals. (Patient taking differently: Take 3 tablets (2,400 mg) by mouth with meals. Take 2 tablets (1,600 mg) by mouth with snacks. Receives supply from dialysis center.)    . tiotropium (SPIRIVA) 18 MCG inhalation capsule Place 1 capsule (18 mcg total) into inhaler and inhale daily. 30 capsule 0   No current facility-administered medications for this visit.     REVIEW OF SYSTEMS:   [X]  denotes positive finding, [ ]  denotes negative finding Cardiac  Comments:  Chest pain or chest pressure:    Shortness of breath upon exertion:    Short of breath when lying flat:    Irregular heart rhythm:        Vascular    Pain in calf, thigh, or hip brought on by ambulation:    Pain in feet at night that wakes you up from your sleep:     Blood clot in your veins:    Leg swelling:         Pulmonary    Oxygen at home:    Productive cough:     Wheezing:         Neurologic    Sudden weakness in arms or legs:     Sudden numbness in arms or legs:     Sudden onset of difficulty speaking or slurred speech:    Temporary loss of vision in one eye:     Problems with dizziness:         Gastrointestinal    Blood in stool:     Vomited blood:         Genitourinary    Burning when urinating:     Blood in urine:        Psychiatric    Major depression:         Hematologic    Bleeding problems:    Problems with blood clotting too  easily:        Skin    Rashes or ulcers:        Constitutional    Fever or chills:      PHYSICAL EXAM:   Vitals:   02/02/17 0850 02/02/17 0851  BP: (!) 157/71 (!) 159/75  Pulse: 68   Resp: 18   Temp: (!) 97 F (36.1 C)   TempSrc: Oral   SpO2: 96%   Weight: 278 lb (126.1 kg)   Height: 5\' 7"  (1.702 m)     GENERAL: The patient is a well-nourished female, in no acute distress. The vital signs are documented above. CARDIAC: There is a regular rate and rhythm.  VASCULAR: There is a very good thrill within the fistula with 2 buttonholes present.  The fistula does  feel somewhat deep under the skin. PULMONARY: Non-labored respirations   MUSCULOSKELETAL: There are no major deformities or cyanosis. NEUROLOGIC: No focal weakness or paresthesias are detected. SKIN: There are no ulcers or rashes noted. PSYCHIATRIC: The patient has a normal affect.  STUDIES:   I had the patient get a duplex of her fistula.  For the most part, the vein is greater than 1 cm below the skin.  It is also somewhat redundant and tortuous  MEDICAL ISSUES:   Because the patient has a patent fistula but cannulation is difficult, likely secondary to the depth and redundant overlying skin, I have recommended an attempt at salvaging this fistula.  I discussed with the patient that we would try to elevate her fistula from its current location.  This would mean removing her to buttonhole cannulation sites.  There is a possibility that this does not make a difference however I suspect that by making this more superficial it will be easier to cannulate and she could continue using this.  I have scheduled the procedure for Wednesday, January 30    Annamarie Major, MD Vascular and Vein Specialists of [facility] [phone] Pager [phone]

## 2017-02-02 NOTE — H&P (View-Only) (Signed)
Vascular and Vein Specialist of [facility]  Patient name: [name] MRN: [id number] DOB: [birth year] Sex: female   REASON FOR VISIT:    Follow up  HISOTRY OF PRESENT ILLNESS:    [name] is a 72 y.o. female who is status post a stage basilic vein fistula creation in 2014 by Dr. Geryl Councilman.  The patient is having difficulty with cannulation's and has had multiple infiltrations recently.  She recently had a catheter placed.  She is sent her over today for further evaluation.   PAST MEDICAL HISTORY:   Past Medical History:  Diagnosis Date  . Abdominal abscess 12-17-10   abdominal abscesses x2 ? one at this time  . Anemia   . Arthritis    "all over" (08/23/2012)  . Blind right eye    fell and crushed socket and eye fell out, was replaced at [facility]  . CHF (congestive heart failure) ([place])   . Chronic lower back pain   . Coronary artery disease    Dr Kadakia-cardiologist 2-3 x year  . Dyspnea   . Dysrhythmia    irregular, "skips beats"  . ESRD (end stage renal disease) on dialysis ([place])    "started 12/2011; [place]; TTS" (08/23/2012  . Exertional shortness of breath   . Eye drainage    "lots; since fall 07/2011" (08/23/2012)  . Gout   . H/O hiatal hernia   . Headache(784.0)    "weekly sometimes; since I fell and hit my head in 07/2011 08/23/2012)  . Heart murmur   . History of blood transfusion    "lots before starting dialysis" (08/23/2012)  . Hypertension    sees Dr. Willey Blade  . Inhalation injury    "worked at [employer]; can't inhale polyurethane or paint, etc" (08/23/2012)  . Myocardial infarction ([place]) 1970's or 1980's  . Neuromuscular disorder ([place])    diabetic neuropathy  . OSA on CPAP   . Staph aureus infection November 2012   . Swelling of both ankles 12-17-10  . Type II diabetes mellitus ([place])    "over 10 years now" (08/23/2012)     FAMILY HISTORY:   Family History  Problem Relation Age of Onset  .  Hypertension Mother   . Cancer Brother        spine  . Hypertension Father   . Cancer Sister        brain  . Hypertension Maternal Grandmother   . Anesthesia problems Neg Hx     SOCIAL HISTORY:   Social History   Tobacco Use  . Smoking status: Former Smoker    Packs/day: 0.25    Years: 16.00    Pack years: 4.00    Types: Cigarettes    Last attempt to quit: 12/16/1980    Years since quitting: 36.1  . Smokeless tobacco: Never Used  Substance Use Topics  . Alcohol use: No     ALLERGIES:   No Known Allergies   CURRENT MEDICATIONS:   Current Outpatient Medications  Medication Sig Dispense Refill  . albuterol (PROVENTIL HFA;VENTOLIN HFA) 108 (90 BASE) MCG/ACT inhaler Inhale 2 puffs into the lungs every 4 (four) hours as needed for wheezing or shortness of breath.    Marland Kitchen albuterol (PROVENTIL) (2.5 MG/3ML) 0.083% nebulizer solution Take 3 mLs (2.5 mg total) by nebulization every 2 (two) hours as needed for shortness of breath. 75 mL 0  . aspirin EC 81 MG tablet Take 81 mg by mouth daily.      Marland Kitchen atorvastatin (LIPITOR)  40 MG tablet Take 40 mg by mouth at bedtime.     . bisacodyl (DULCOLAX) 5 MG EC tablet Take 10 mg by mouth daily as needed for moderate constipation.     . Calcium Carbonate-Vitamin D (CALCIUM 600 + D PO) Take 1 tablet by mouth daily.     . carvedilol (COREG) 25 MG tablet Take 1 tablet (25 mg) by mouth twice daily - first dose in the afternoon, second dose at bedtime.    . cinacalcet (SENSIPAR) 90 MG tablet Take 1 tablet (90 mg) by mouth daily. On dialysis days, take the dose after dialysis    . diphenhydrAMINE (BENADRYL) 25 mg capsule Take 50 mg by mouth See admin instructions. Take 2 tablets (50 mg) by mouth prior to dialysis on Tuesday, Thursday, Saturday - for itching    . ethyl chloride spray Apply 1 application topically See admin instructions. Apply topically before dialysis on Tuesday, Thursday, Saturday  5  . HYDROcodone-acetaminophen (NORCO/VICODIN) 5-325  MG tablet Take 1 tablet by mouth 2 (two) times daily as needed (knee pain).     . insulin glargine (LANTUS) 100 UNIT/ML injection Inject 0.1 mLs (10 Units total) into the skin at bedtime. 10 mL 0  . midodrine (PROAMATINE) 10 MG tablet Take 1 tablet by mouth twice daily at 4AM and 9AM. On dialysis days, take first dose before dialysis and second dose mid-way through dialysis.    . Multiple Vitamin (MULITIVITAMIN WITH MINERALS) TABS Take 1 tablet by mouth daily.    . sevelamer carbonate (RENVELA) 800 MG tablet Take 1 tablet (800 mg total) by mouth 3 (three) times daily with meals. (Patient taking differently: Take 3 tablets (2,400 mg) by mouth with meals. Take 2 tablets (1,600 mg) by mouth with snacks. Receives supply from dialysis center.)    . tiotropium (SPIRIVA) 18 MCG inhalation capsule Place 1 capsule (18 mcg total) into inhaler and inhale daily. 30 capsule 0   No current facility-administered medications for this visit.     REVIEW OF SYSTEMS:   [X]  denotes positive finding, [ ]  denotes negative finding Cardiac  Comments:  Chest pain or chest pressure:    Shortness of breath upon exertion:    Short of breath when lying flat:    Irregular heart rhythm:        Vascular    Pain in calf, thigh, or hip brought on by ambulation:    Pain in feet at night that wakes you up from your sleep:     Blood clot in your veins:    Leg swelling:         Pulmonary    Oxygen at home:    Productive cough:     Wheezing:         Neurologic    Sudden weakness in arms or legs:     Sudden numbness in arms or legs:     Sudden onset of difficulty speaking or slurred speech:    Temporary loss of vision in one eye:     Problems with dizziness:         Gastrointestinal    Blood in stool:     Vomited blood:         Genitourinary    Burning when urinating:     Blood in urine:        Psychiatric    Major depression:         Hematologic    Bleeding problems:    Problems with blood clotting too  easily:        Skin    Rashes or ulcers:        Constitutional    Fever or chills:      PHYSICAL EXAM:   Vitals:   02/02/17 0850 02/02/17 0851  BP: (!) 157/71 (!) 159/75  Pulse: 68   Resp: 18   Temp: (!) 97 F (36.1 C)   TempSrc: Oral   SpO2: 96%   Weight: 278 lb (126.1 kg)   Height: 5\' 7"  (1.702 m)     GENERAL: The patient is a well-nourished female, in no acute distress. The vital signs are documented above. CARDIAC: There is a regular rate and rhythm.  VASCULAR: There is a very good thrill within the fistula with 2 buttonholes present.  The fistula does  feel somewhat deep under the skin. PULMONARY: Non-labored respirations   MUSCULOSKELETAL: There are no major deformities or cyanosis. NEUROLOGIC: No focal weakness or paresthesias are detected. SKIN: There are no ulcers or rashes noted. PSYCHIATRIC: The patient has a normal affect.  STUDIES:   I had the patient get a duplex of her fistula.  For the most part, the vein is greater than 1 cm below the skin.  It is also somewhat redundant and tortuous  MEDICAL ISSUES:   Because the patient has a patent fistula but cannulation is difficult, likely secondary to the depth and redundant overlying skin, I have recommended an attempt at salvaging this fistula.  I discussed with the patient that we would try to elevate her fistula from its current location.  This would mean removing her to buttonhole cannulation sites.  There is a possibility that this does not make a difference however I suspect that by making this more superficial it will be easier to cannulate and she could continue using this.  I have scheduled the procedure for Wednesday, January 30    Annamarie Major, MD Vascular and Vein Specialists of [facility] [phone] Pager [phone]

## 2017-02-03 DIAGNOSIS — N2581 Secondary hyperparathyroidism of renal origin: Secondary | ICD-10-CM | POA: Diagnosis not present

## 2017-02-03 DIAGNOSIS — E782 Mixed hyperlipidemia: Secondary | ICD-10-CM | POA: Diagnosis not present

## 2017-02-03 DIAGNOSIS — N186 End stage renal disease: Secondary | ICD-10-CM | POA: Diagnosis not present

## 2017-02-04 ENCOUNTER — Encounter: Payer: Self-pay | Admitting: Nephrology

## 2017-02-04 DIAGNOSIS — E782 Mixed hyperlipidemia: Secondary | ICD-10-CM | POA: Diagnosis not present

## 2017-02-05 DIAGNOSIS — N186 End stage renal disease: Secondary | ICD-10-CM | POA: Diagnosis not present

## 2017-02-05 DIAGNOSIS — N2581 Secondary hyperparathyroidism of renal origin: Secondary | ICD-10-CM | POA: Diagnosis not present

## 2017-02-05 DIAGNOSIS — E782 Mixed hyperlipidemia: Secondary | ICD-10-CM | POA: Diagnosis not present

## 2017-02-06 DIAGNOSIS — N2581 Secondary hyperparathyroidism of renal origin: Secondary | ICD-10-CM | POA: Diagnosis not present

## 2017-02-06 DIAGNOSIS — E782 Mixed hyperlipidemia: Secondary | ICD-10-CM | POA: Diagnosis not present

## 2017-02-06 DIAGNOSIS — N186 End stage renal disease: Secondary | ICD-10-CM | POA: Diagnosis not present

## 2017-02-09 DIAGNOSIS — E782 Mixed hyperlipidemia: Secondary | ICD-10-CM | POA: Diagnosis not present

## 2017-02-10 DIAGNOSIS — N186 End stage renal disease: Secondary | ICD-10-CM | POA: Diagnosis not present

## 2017-02-10 DIAGNOSIS — N2581 Secondary hyperparathyroidism of renal origin: Secondary | ICD-10-CM | POA: Diagnosis not present

## 2017-02-10 DIAGNOSIS — E782 Mixed hyperlipidemia: Secondary | ICD-10-CM | POA: Diagnosis not present

## 2017-02-11 DIAGNOSIS — E782 Mixed hyperlipidemia: Secondary | ICD-10-CM | POA: Diagnosis not present

## 2017-02-12 DIAGNOSIS — N2581 Secondary hyperparathyroidism of renal origin: Secondary | ICD-10-CM | POA: Diagnosis not present

## 2017-02-12 DIAGNOSIS — N186 End stage renal disease: Secondary | ICD-10-CM | POA: Diagnosis not present

## 2017-02-12 DIAGNOSIS — E782 Mixed hyperlipidemia: Secondary | ICD-10-CM | POA: Diagnosis not present

## 2017-02-13 DIAGNOSIS — E782 Mixed hyperlipidemia: Secondary | ICD-10-CM | POA: Diagnosis not present

## 2017-02-14 DIAGNOSIS — N2581 Secondary hyperparathyroidism of renal origin: Secondary | ICD-10-CM | POA: Diagnosis not present

## 2017-02-14 DIAGNOSIS — N186 End stage renal disease: Secondary | ICD-10-CM | POA: Diagnosis not present

## 2017-02-16 DIAGNOSIS — E782 Mixed hyperlipidemia: Secondary | ICD-10-CM | POA: Diagnosis not present

## 2017-02-17 ENCOUNTER — Encounter (HOSPITAL_COMMUNITY): Payer: Self-pay | Admitting: Emergency Medicine

## 2017-02-17 DIAGNOSIS — N2581 Secondary hyperparathyroidism of renal origin: Secondary | ICD-10-CM | POA: Diagnosis not present

## 2017-02-17 DIAGNOSIS — E782 Mixed hyperlipidemia: Secondary | ICD-10-CM | POA: Diagnosis not present

## 2017-02-17 DIAGNOSIS — N186 End stage renal disease: Secondary | ICD-10-CM | POA: Diagnosis not present

## 2017-02-17 NOTE — Pre-Procedure Instructions (Signed)
KYLIN GENNA  02/17/2017      [id number] - [address], [place] [address] Phone: [phone] Fax: [phone]    Your procedure is scheduled on February 18, 2017.  Report to [facility] Admitting at [place] A.M.  Call this number if you have problems the morning of surgery:  [phone]   Remember:  Do not eat food or drink liquids after midnight.  Take these medicines the morning of surgery with A SIP OF WATER coreg, albuterol inhaler (bring with you), albuterol nebulizer-if needed, midrodine.   WHAT DO I DO ABOUT MY DIABETES MEDICATION?  Marland Kitchen Do not take oral diabetes medicines (pills) the morning of surgery.  . THE NIGHT BEFORE SURGERY, take 2 units of  lantus insulin (1/2 normal dose).      How to Manage Your Diabetes Before and After Surgery  Why is it important to control my blood sugar before and after surgery? . Improving blood sugar levels before and after surgery helps healing and can limit problems. . A way of improving blood sugar control is eating a healthy diet by: o  Eating less sugar and carbohydrates o  Increasing activity/exercise o  Talking with your doctor about reaching your blood sugar goals . High blood sugars (greater than 180 mg/dL) can raise your risk of infections and slow your recovery, so you will need to focus on controlling your diabetes during the weeks before surgery. . Make sure that the doctor who takes care of your diabetes knows about your planned surgery including the date and location.  How do I manage my blood sugar before surgery? . Check your blood sugar at least 4 times a day, starting 2 days before surgery, to make sure that the level is not too high or low. o Check your blood sugar the morning of your surgery when you wake up and every 2 hours until you get to the Short Stay unit. . If your blood sugar is less  than 70 mg/dL, you will need to treat for low blood sugar: o Do not take insulin. o Treat a low blood sugar (less than 70 mg/dL) with  cup of clear juice (cranberry or apple), 4 glucose tablets, OR glucose gel. Recheck blood sugar in 15 minutes after treatment (to make sure it is greater than 70 mg/dL). If your blood sugar is not greater than 70 mg/dL on recheck, call [phone] o  for further instructions. . Report your blood sugar to the short stay nurse when you get to Short Stay.  . If you are admitted to the hospital after surgery: o Your blood sugar will be checked by the staff and you will probably be given insulin after surgery (instead of oral diabetes medicines) to make sure you have good blood sugar levels. o The goal for blood sugar control after surgery is 80-180 mg/dL.   Reviewed and Endorsed by [facility] Patient Education Committee, August 2015    Do not wear jewelry, make-up or nail polish.  Do not wear lotions, powders, or perfumes, or deodorant.  Do not shave 48 hours prior to surgery.    Do not bring valuables to the hospital.  [facility] is not responsible for any belongings or valuables.  Contacts, dentures or bridgework may not be worn into surgery.  Leave your suitcase in the car.  After surgery it may be brought  to your room.  For patients admitted to the hospital, discharge time will be determined by your treatment team.  Patients discharged the day of surgery will not be allowed to drive home.   Special instructions:   Please read over the following fact sheets that you were given.

## 2017-02-17 NOTE — Progress Notes (Signed)
Anesthesia Chart Review:  Pt is a same day work up  Pt is a 72 year old female scheduled for L arm AV fistula superficialization on 02/18/2017 with Harold Barban, MD  - PCP is [name], MD - Pulmonologist is Marshell Garfinkel, MD  PMH includes:  CAD (PCI [phone]; normal coronaries by 08/25/12 cath), CHF, mild aortic stenosis (by 09/2016 echo), irregular heart beat, HTN, DM, anemia, ESRD on hemodialysis. Uses home oxygen. Blind R eye. Former smoker. S/p exploratory laparotomy 12/18/10.   - Hospitalized 9/24 - 10/21/16 for acute on chronic combined systolic and diastolic CHF, acute respiratory failure  Medications include: albuterol, ASA 69m, lipitor, carvedilol, lasix, lantus, midodrine  Labs will be obtained day of surgery  CXR 10/16/16:  - Persistent pulmonary vascular congestion without frank edema or consolidation. There is aortic atherosclerosis.  CT chest 10/16/16:  1. Mild cardiomegaly. 2. Mild centrilobular emphysema.  Dependent atelectasis. 3. LEFT thyromegaly with substernal extent, no dominant nodule. 4. Aortic Atherosclerosis  EKG 10/13/16: Sinus rhythm with 1st degree A-V block with Blocked PACs. LAD  Nuclear stress test 10/20/16:  1. There is attenuation artifact in the inferior wall. There may be a small fixed defect at the apex but there are no large perfusion defects and there is no stress-induced ischemia. 2. Normal left ventricular wall motion. 3. Left ventricular ejection fraction 69% 4. Non invasive risk stratification: Low risk.  Echo 10/15/16:  - Left ventricle: The cavity size was normal. Wall thickness was increased in a pattern of mild LVH. Systolic function was normal. The estimated ejection fraction was in the range of 55% to 60%. Doppler parameters are consistent with both elevated ventricular end-diastolic filling pressure and elevated left atrial filling pressure. - Aortic valve: There was mild stenosis. Valve area (VTI): 1.62 cm^2. Valve area (Vmax): 1.71  cm^2. Valve area (Vmean): 1.68 cm^2. - Mitral valve: Severe MAC with mild functional MS. Severely calcified annulus. Moderately thickened, moderately calcified leaflets. Valve area by pressure half-time: 2 cm^2. Valve area by continuity equation (using LVOT flow): 1.42 cm^2. - Left atrium: The atrium was mildly dilated. - Atrial septum: There was increased thickness of the septum, consistent with lipomatous hypertrophy. - Pulmonary arteries: PA peak pressure: 32 mm Hg (S).  Cardiac cath 08/25/12:  1. Normal coronary arteries. 2. Normal left ventricular systolic function.  LVEDP 20 mmHg.  Ejection fraction 60%.  If labs acceptable day of surgery, and no acute CV symptoms, I anticipate pt can proceed with surgery as scheduled.   AWilleen Cass FNP-BC [facility]ur[facility] Phone: [phone]/29/2019 1:58 PM

## 2017-02-18 ENCOUNTER — Ambulatory Visit (HOSPITAL_COMMUNITY)
Admission: AD | Admit: 2017-02-18 | Discharge: 2017-02-18 | Disposition: A | Discharge status: Home / Self Care | Payer: Medicare HMO | Source: Ambulatory Visit | Attending: Surgery | Admitting: Surgery

## 2017-02-18 ENCOUNTER — Other Ambulatory Visit: Payer: Self-pay

## 2017-02-18 ENCOUNTER — Ambulatory Visit (HOSPITAL_COMMUNITY): Payer: Medicare HMO | Admitting: Emergency Medicine

## 2017-02-18 ENCOUNTER — Encounter (HOSPITAL_COMMUNITY): Payer: Self-pay | Admitting: Anesthesiology

## 2017-02-18 ENCOUNTER — Encounter (HOSPITAL_COMMUNITY)
Admission: AD | Disposition: A | Discharge status: Home / Self Care | Payer: Self-pay | Source: Ambulatory Visit | Attending: Surgery

## 2017-02-18 DIAGNOSIS — Z6841 Body Mass Index (BMI) 40.0 and over, adult: Secondary | ICD-10-CM | POA: Diagnosis not present

## 2017-02-18 DIAGNOSIS — Z79899 Other long term (current) drug therapy: Secondary | ICD-10-CM | POA: Diagnosis not present

## 2017-02-18 DIAGNOSIS — H5461 Unqualified visual loss, right eye, normal vision left eye: Secondary | ICD-10-CM | POA: Diagnosis not present

## 2017-02-18 DIAGNOSIS — I700 Atherosclerosis of aorta: Secondary | ICD-10-CM | POA: Insufficient documentation

## 2017-02-18 DIAGNOSIS — Z9989 Dependence on other enabling machines and devices: Secondary | ICD-10-CM | POA: Insufficient documentation

## 2017-02-18 DIAGNOSIS — I120 Hypertensive chronic kidney disease with stage 5 chronic kidney disease or end stage renal disease: Secondary | ICD-10-CM | POA: Diagnosis not present

## 2017-02-18 DIAGNOSIS — G4733 Obstructive sleep apnea (adult) (pediatric): Secondary | ICD-10-CM | POA: Diagnosis not present

## 2017-02-18 DIAGNOSIS — I132 Hypertensive heart and chronic kidney disease with heart failure and with stage 5 chronic kidney disease, or end stage renal disease: Secondary | ICD-10-CM | POA: Diagnosis not present

## 2017-02-18 DIAGNOSIS — E1140 Type 2 diabetes mellitus with diabetic neuropathy, unspecified: Secondary | ICD-10-CM | POA: Diagnosis not present

## 2017-02-18 DIAGNOSIS — Z87891 Personal history of nicotine dependence: Secondary | ICD-10-CM | POA: Diagnosis not present

## 2017-02-18 DIAGNOSIS — Z7982 Long term (current) use of aspirin: Secondary | ICD-10-CM | POA: Insufficient documentation

## 2017-02-18 DIAGNOSIS — T82590A Other mechanical complication of surgically created arteriovenous fistula, initial encounter: Secondary | ICD-10-CM | POA: Diagnosis not present

## 2017-02-18 DIAGNOSIS — N186 End stage renal disease: Secondary | ICD-10-CM | POA: Diagnosis not present

## 2017-02-18 DIAGNOSIS — E1122 Type 2 diabetes mellitus with diabetic chronic kidney disease: Secondary | ICD-10-CM | POA: Insufficient documentation

## 2017-02-18 DIAGNOSIS — Z794 Long term (current) use of insulin: Secondary | ICD-10-CM | POA: Insufficient documentation

## 2017-02-18 DIAGNOSIS — Z992 Dependence on renal dialysis: Secondary | ICD-10-CM | POA: Diagnosis not present

## 2017-02-18 DIAGNOSIS — I440 Atrioventricular block, first degree: Secondary | ICD-10-CM | POA: Insufficient documentation

## 2017-02-18 DIAGNOSIS — I252 Old myocardial infarction: Secondary | ICD-10-CM | POA: Insufficient documentation

## 2017-02-18 DIAGNOSIS — I5043 Acute on chronic combined systolic (congestive) and diastolic (congestive) heart failure: Secondary | ICD-10-CM | POA: Diagnosis not present

## 2017-02-18 DIAGNOSIS — I2510 Atherosclerotic heart disease of native coronary artery without angina pectoris: Secondary | ICD-10-CM | POA: Insufficient documentation

## 2017-02-18 DIAGNOSIS — E782 Mixed hyperlipidemia: Secondary | ICD-10-CM | POA: Diagnosis not present

## 2017-02-18 DIAGNOSIS — D631 Anemia in chronic kidney disease: Secondary | ICD-10-CM | POA: Insufficient documentation

## 2017-02-18 DIAGNOSIS — I350 Nonrheumatic aortic (valve) stenosis: Secondary | ICD-10-CM | POA: Insufficient documentation

## 2017-02-18 DIAGNOSIS — Y832 Surgical operation with anastomosis, bypass or graft as the cause of abnormal reaction of the patient, or of later complication, without mention of misadventure at the time of the procedure: Secondary | ICD-10-CM | POA: Diagnosis not present

## 2017-02-18 DIAGNOSIS — T82898A Other specified complication of vascular prosthetic devices, implants and grafts, initial encounter: Secondary | ICD-10-CM | POA: Diagnosis not present

## 2017-02-18 HISTORY — PX: FISTULA SUPERFICIALIZATION: SHX6341

## 2017-02-18 LAB — HEMOGLOBIN A1C
Hgb A1c MFr Bld: 6 % — ABNORMAL HIGH (ref 4.8–5.6)
Mean Plasma Glucose: 125.5 mg/dL

## 2017-02-18 LAB — GLUCOSE, CAPILLARY
Glucose-Capillary: 100 mg/dL — ABNORMAL HIGH (ref 65–99)
Glucose-Capillary: 76 mg/dL (ref 65–99)
Glucose-Capillary: 71 mg/dL (ref 65–99)

## 2017-02-18 LAB — POCT I-STAT 4, (NA,K, GLUC, HGB,HCT)
GLUCOSE: 100 mg/dL — AB (ref 65–99)
HEMATOCRIT: 34 % — AB (ref 36.0–46.0)
HEMOGLOBIN: 11.6 g/dL — AB (ref 12.0–15.0)
Potassium: 5.4 mmol/L — ABNORMAL HIGH (ref 3.5–5.1)
SODIUM: 142 mmol/L (ref 135–145)

## 2017-02-18 SURGERY — FISTULA SUPERFICIALIZATION
Anesthesia: General | Site: Arm Upper | Laterality: Left

## 2017-02-18 MED ORDER — PHENYLEPHRINE 40 MCG/ML (10ML) SYRINGE FOR IV PUSH (FOR BLOOD PRESSURE SUPPORT)
PREFILLED_SYRINGE | INTRAVENOUS | Status: AC
  Filled 2017-02-18: qty 10

## 2017-02-18 MED ORDER — FENTANYL CITRATE (PF) 250 MCG/5ML IJ SOLN
INTRAMUSCULAR | Status: AC
  Filled 2017-02-18: qty 5

## 2017-02-18 MED ORDER — LIDOCAINE-EPINEPHRINE (PF) 1 %-1:200000 IJ SOLN
INTRAMUSCULAR | Status: AC
  Filled 2017-02-18: qty 30

## 2017-02-18 MED ORDER — LIDOCAINE 2% (20 MG/ML) 5 ML SYRINGE
INTRAMUSCULAR | Status: AC
  Filled 2017-02-18: qty 5

## 2017-02-18 MED ORDER — SODIUM CHLORIDE 0.9 % IV SOLN
INTRAVENOUS | Status: DC | PRN
  Administered 2017-02-18 (×2): 80 ug via INTRAVENOUS

## 2017-02-18 MED ORDER — EPHEDRINE 5 MG/ML INJ
INTRAVENOUS | Status: AC
  Filled 2017-02-18: qty 10

## 2017-02-18 MED ORDER — ONDANSETRON HCL 4 MG/2ML IJ SOLN
INTRAMUSCULAR | Status: AC
  Filled 2017-02-18: qty 2

## 2017-02-18 MED ORDER — SODIUM CHLORIDE 0.9 % IV SOLN
INTRAVENOUS | Status: DC
  Administered 2017-02-18: 11:00:00 via INTRAVENOUS

## 2017-02-18 MED ORDER — ONDANSETRON HCL 4 MG/2ML IJ SOLN: 4.0000 mg | Freq: Once | INTRAMUSCULAR | Status: DC | PRN

## 2017-02-18 MED ORDER — SODIUM CHLORIDE 0.9 % IV SOLN
INTRAVENOUS | Status: DC | PRN
  Administered 2017-02-18: 500 mL

## 2017-02-18 MED ORDER — FENTANYL CITRATE (PF) 250 MCG/5ML IJ SOLN
INTRAMUSCULAR | Status: DC | PRN
  Administered 2017-02-18 (×2): 50 ug via INTRAVENOUS

## 2017-02-18 MED ORDER — DEXTROSE 5 % IV SOLN
1.5000 g | INTRAVENOUS | Status: AC
  Administered 2017-02-18: 1.5 g via INTRAVENOUS
  Filled 2017-02-18: qty 1.5

## 2017-02-18 MED ORDER — LIDOCAINE HCL (CARDIAC) 20 MG/ML IV SOLN
INTRAVENOUS | Status: DC | PRN
Start: 2017-02-18 — End: 2017-02-18
  Administered 2017-02-18: 60 mg via INTRATRACHEAL

## 2017-02-18 MED ORDER — 0.9 % SODIUM CHLORIDE (POUR BTL) OPTIME
TOPICAL | Status: DC | PRN
  Administered 2017-02-18: 1000 mL

## 2017-02-18 MED ORDER — PHENYLEPHRINE HCL 10 MG/ML IJ SOLN
INTRAVENOUS | Status: DC | PRN
  Administered 2017-02-18: 20 ug/min via INTRAVENOUS

## 2017-02-18 MED ORDER — HYDROCODONE-ACETAMINOPHEN 5-325 MG PO TABS: 1.0000 | ORAL_TABLET | Freq: Four times a day (QID) | ORAL | 0 refills | Status: DC | PRN

## 2017-02-18 MED ORDER — PROPOFOL 10 MG/ML IV BOLUS
INTRAVENOUS | Status: DC | PRN
  Administered 2017-02-18: 120 mg via INTRAVENOUS

## 2017-02-18 MED ORDER — FENTANYL CITRATE (PF) 100 MCG/2ML IJ SOLN: 25.0000 ug | INTRAMUSCULAR | Status: DC | PRN

## 2017-02-18 MED ORDER — ONDANSETRON HCL 4 MG/2ML IJ SOLN
INTRAMUSCULAR | Status: DC | PRN
  Administered 2017-02-18: 4 mg via INTRAVENOUS

## 2017-02-18 MED ORDER — EPHEDRINE SULFATE 50 MG/ML IJ SOLN
INTRAMUSCULAR | Status: DC | PRN
  Administered 2017-02-18 (×2): 10 mg via INTRAVENOUS

## 2017-02-18 SURGICAL SUPPLY — 34 items
ARMBAND PINK RESTRICT EXTREMIT (MISCELLANEOUS) ×3 IMPLANT
CANISTER SUCT 3000ML PPV (MISCELLANEOUS) ×3 IMPLANT
CLIP VESOCCLUDE MED 6/CT (CLIP) ×3 IMPLANT
CLIP VESOCCLUDE SM WIDE 6/CT (CLIP) ×3 IMPLANT
COVER PROBE W GEL 5X96 (DRAPES) ×3 IMPLANT
DERMABOND ADVANCED (GAUZE/BANDAGES/DRESSINGS) ×2
DERMABOND ADVANCED .7 DNX12 (GAUZE/BANDAGES/DRESSINGS) ×1 IMPLANT
ELECT REM PT RETURN 9FT ADLT (ELECTROSURGICAL) ×3
ELECTRODE REM PT RTRN 9FT ADLT (ELECTROSURGICAL) ×1 IMPLANT
GLOVE BIOGEL PI IND STRL 6.5 (GLOVE) ×2 IMPLANT
GLOVE BIOGEL PI IND STRL 7.5 (GLOVE) ×1 IMPLANT
GLOVE BIOGEL PI INDICATOR 6.5 (GLOVE) ×4
GLOVE BIOGEL PI INDICATOR 7.5 (GLOVE) ×2
GLOVE SURG SS PI 6.5 STRL IVOR (GLOVE) ×3 IMPLANT
GLOVE SURG SS PI 7.5 STRL IVOR (GLOVE) ×3 IMPLANT
GOWN STRL REUS W/ TWL LRG LVL3 (GOWN DISPOSABLE) ×3 IMPLANT
GOWN STRL REUS W/ TWL XL LVL3 (GOWN DISPOSABLE) ×2 IMPLANT
GOWN STRL REUS W/TWL LRG LVL3 (GOWN DISPOSABLE) ×6
GOWN STRL REUS W/TWL XL LVL3 (GOWN DISPOSABLE) ×4
HEMOSTAT SNOW SURGICEL 2X4 (HEMOSTASIS) IMPLANT
KIT BASIN OR (CUSTOM PROCEDURE TRAY) ×3 IMPLANT
KIT ROOM TURNOVER OR (KITS) ×3 IMPLANT
NS IRRIG 1000ML POUR BTL (IV SOLUTION) ×3 IMPLANT
PACK CV ACCESS (CUSTOM PROCEDURE TRAY) ×3 IMPLANT
PAD ARMBOARD 7.5X6 YLW CONV (MISCELLANEOUS) ×6 IMPLANT
SUT PROLENE 6 0 CC (SUTURE) ×3 IMPLANT
SUT VIC AB 3-0 SH 18 (SUTURE) ×3 IMPLANT
SUT VIC AB 3-0 SH 27 (SUTURE) ×6
SUT VIC AB 3-0 SH 27X BRD (SUTURE) ×3 IMPLANT
SUT VIC AB 4-0 PS2 18 (SUTURE) ×3 IMPLANT
SUT VICRYL 4-0 PS2 18IN ABS (SUTURE) IMPLANT
TOWEL GREEN STERILE (TOWEL DISPOSABLE) ×3 IMPLANT
UNDERPAD 30X30 (UNDERPADS AND DIAPERS) ×3 IMPLANT
WATER STERILE IRR 1000ML POUR (IV SOLUTION) ×3 IMPLANT

## 2017-02-18 NOTE — Op Note (Signed)
    Patient name: [name] MRN: [id number] DOB: April 23, 1945 Sex: female  02/18/2017 Pre-operative Diagnosis: Poorly functioning left basilic vein fistula Post-operative diagnosis:  Same Surgeon:  Annamarie Major Assistants: Arlee Muslim Procedure:   Elevation of left basilic vein fistula Anesthesia: General Blood Loss: Minimal Specimens: None  Findings: Healthy appearing vein measuring approximately 8 mm.  Indications: The patient is having difficulty with her left basilic vein fistula.  She now has a catheter in place.  When I examined her in the office with ultrasound I felt that the fistula was too deep and therefore she comes in today for elevation.  Procedure:  The patient was identified in the holding area and taken to [facility]  The patient was then placed supine on the table. general anesthesia was administered.  The patient was prepped and draped in the usual sterile fashion.  A time out was called and antibiotics were administered.  I began by making 2 longitudinal incisions over the fistula.  The fistula circumferentially exposed and fully mobilized.  I then dissected out the fistula where she previously had buttonholes created.  I elected to connect the 2 incisions at this point so that I could resect the skin where the button holes were.  Once this was done the fistula was fully mobilized.  The vein appeared to be very healthy measuring mostly greater than 8 mm throughout.  I then reapproximated the subcutaneous tissue posterior to the fistula using interrupted 3-0 Vicryl sutures.  I then closed the skin incision directly anterior to the fistula with running 4-0 Vicryl.  Dermabond was placed.  There were no immediate complications.   Disposition: To PACU stable   V. Annamarie Major, M.D. [facility] Office: [phone] Pager:  [phone]

## 2017-02-18 NOTE — Anesthesia Preprocedure Evaluation (Addendum)
Anesthesia Evaluation  Patient identified by MRN, date of birth, ID band Patient awake    Reviewed: Allergy & Precautions, NPO status , Patient's Chart, lab work & pertinent test results  Airway Mallampati: II  TM Distance: >3 FB Neck ROM: Full    Dental  (+) Edentulous Upper, Dental Advisory Given   Pulmonary shortness of breath and with exertion, sleep apnea and Continuous Positive Airway Pressure Ventilation , former smoker,    Pulmonary exam normal breath sounds clear to auscultation       Cardiovascular hypertension, Pt. on medications and Pt. on home beta blockers + CAD, + Past MI and +CHF  Normal cardiovascular exam+ dysrhythmias + Valvular Problems/Murmurs  Rhythm:Regular Rate:Normal  EKG 09/2016- NSR w/ PAC's, 1st Degree AV block  Echo 09/2016-Left ventricle: The cavity size was normal. Wall thickness was   increased in a pattern of mild LVH. Systolic function was normal.   The estimated ejection fraction was in the range of 55% to 60%.   Doppler parameters are consistent with both elevated ventricular   end-diastolic filling pressure and elevated left atrial filling   pressure. - Aortic valve: There was mild stenosis. Valve area (VTI): 1.62   cm^2. Valve area (Vmax): 1.71 cm^2. Valve area (Vmean): 1.68   cm^2. - Mitral valve: Severe MAC with mild functional MS. Severely   calcified annulus. Moderately thickened, moderately calcified   leaflets . Valve area by pressure half-time: 2 cm^2. Valve area   by continuity equation (using LVOT flow): 1.42 cm^2. - Left atrium: The atrium was mildly dilated. - Atrial septum: There was increased thickness of the septum,   consistent with lipomatous hypertrophy. - Pulmonary arteries: PA peak pressure: 32 mm Hg (S).   Neuro/Psych  Headaches, Diabetic neuropathy Blind OD- trauma  Neuromuscular disease negative psych ROS   GI/Hepatic Neg liver ROS, hiatal hernia,   Endo/Other   diabetes, Well Controlled, Type 2, Insulin DependentMorbid obesity  Renal/GU ESRF and DialysisRenal disease  negative genitourinary   Musculoskeletal  (+) Arthritis , Osteoarthritis,    Abdominal   Peds  Hematology  (+) anemia ,   Anesthesia Other Findings   Reproductive/Obstetrics                           Anesthesia Physical Anesthesia Plan  ASA: IV  Anesthesia Plan: MAC   Post-op Pain Management:    Induction: Intravenous  PONV Risk Score and Plan: Propofol infusion, Treatment may vary due to age or medical condition and Ondansetron  Airway Management Planned: Natural Airway and Simple Face Mask  Additional Equipment:   Intra-op Plan:   Post-operative Plan:   Informed Consent: I have reviewed the patients History and Physical, chart, labs and discussed the procedure including the risks, benefits and alternatives for the proposed anesthesia with the patient or authorized representative who has indicated his/her understanding and acceptance.     Plan Discussed with: CRNA and Surgeon  Anesthesia Plan Comments:         Anesthesia Quick Evaluation

## 2017-02-18 NOTE — Interval H&P Note (Signed)
History and Physical Interval Note:  02/18/2017 11:51 AM  [name]  has presented today for surgery, with the diagnosis of END STAGE RENAL DISEASE  The various methods of treatment have been discussed with the patient and family. After consideration of risks, benefits and other options for treatment, the patient has consented to  Procedure(s): FISTULA SUPERFICIALIZATION LEFT ARM ARTERIOVENOUS FISTULA (Left) as a surgical intervention .  The patient's history has been reviewed, patient examined, no change in status, stable for surgery.  I have reviewed the patient's chart and labs.  Questions were answered to the patient's satisfaction.     Annamarie Major

## 2017-02-18 NOTE — Progress Notes (Signed)
Dr. Smith Robert made aware of pt's K+ 5.4.

## 2017-02-18 NOTE — Anesthesia Procedure Notes (Signed)
Procedure Name: LMA Insertion Date/Time: 02/18/2017 2:20 PM Performed by: Scheryl Darter, CRNA Pre-anesthesia Checklist: Patient identified, Emergency Drugs available, Suction available and Patient being monitored Patient Re-evaluated:Patient Re-evaluated prior to induction Oxygen Delivery Method: Circle System Utilized Preoxygenation: Pre-oxygenation with 100% oxygen Induction Type: IV induction Ventilation: Mask ventilation without difficulty LMA: LMA inserted LMA Size: 4.0 Number of attempts: 1 Placement Confirmation: positive ETCO2 Tube secured with: Tape Dental Injury: Teeth and Oropharynx as per pre-operative assessment

## 2017-02-18 NOTE — Progress Notes (Signed)
Voicemail left with [employer], public transportation. Patient will be discharged home with daughter and transported on public transportation upon their arrival.

## 2017-02-18 NOTE — Transfer of Care (Signed)
Immediate Anesthesia Transfer of Care Note  Patient: [name]  Procedure(s) Performed: SUPERFICIALIZATION LEFT ARM ARTERIOVENOUS FISTULA (Left Arm Upper)  Patient Location: PACU  Anesthesia Type:General  Level of Consciousness: awake, alert , oriented and sedated  Airway & Oxygen Therapy: Patient Spontanous Breathing  Post-op Assessment: Report given to RN, Post -op Vital signs reviewed and stable and Patient moving all extremities  Post vital signs: Reviewed and stable  Last Vitals:  Vitals:   02/18/17 1115 02/18/17 1546  BP:  (!) 153/78  Pulse:  71  Resp:  (!) 21  Temp:  36.6 C  SpO2: 100% 93%    Last Pain:  Vitals:   02/18/17 1546  TempSrc:   PainSc: 0-No pain      Patients Stated Pain Goal: 3 (54/83/23 4688)  Complications: No apparent anesthesia complications

## 2017-02-19 ENCOUNTER — Encounter (HOSPITAL_COMMUNITY): Payer: Self-pay | Admitting: Surgery

## 2017-02-19 ENCOUNTER — Telehealth: Payer: Self-pay | Admitting: Surgery

## 2017-02-19 DIAGNOSIS — E782 Mixed hyperlipidemia: Secondary | ICD-10-CM | POA: Diagnosis not present

## 2017-02-19 DIAGNOSIS — N186 End stage renal disease: Secondary | ICD-10-CM | POA: Diagnosis not present

## 2017-02-19 DIAGNOSIS — N2581 Secondary hyperparathyroidism of renal origin: Secondary | ICD-10-CM | POA: Diagnosis not present

## 2017-02-19 DIAGNOSIS — Z992 Dependence on renal dialysis: Secondary | ICD-10-CM | POA: Diagnosis not present

## 2017-02-19 DIAGNOSIS — E1129 Type 2 diabetes mellitus with other diabetic kidney complication: Secondary | ICD-10-CM | POA: Diagnosis not present

## 2017-02-19 NOTE — Telephone Encounter (Signed)
-----   Message from Mena Goes, RN sent at 02/18/2017  4:44 PM EST ----- Regarding: 3-4 WEEKS   ----- Message ----- From: Iline Oven Sent: 02/18/2017   3:32 PM To: Vvs Charge Pool  Can you schedule an appt with PA clinic in about 3-4 weeks.  PO L arm fistula superficialization. Thanks, [facility]

## 2017-02-19 NOTE — Telephone Encounter (Signed)
Pt has MWF dialysis ending at 1:00. Sched appt 03/09/17 at 2:30 with the PA. Spoke to pt to inform them of appt.

## 2017-02-19 NOTE — Anesthesia Postprocedure Evaluation (Signed)
Anesthesia Post Note  Patient: [name]  Procedure(s) Performed: SUPERFICIALIZATION LEFT ARM ARTERIOVENOUS FISTULA (Left Arm Upper)     Patient location during evaluation: PACU Anesthesia Type: General Level of consciousness: awake and alert Pain management: pain level controlled Vital Signs Assessment: post-procedure vital signs reviewed and stable Respiratory status: spontaneous breathing, nonlabored ventilation, respiratory function stable and patient connected to nasal cannula oxygen Cardiovascular status: blood pressure returned to baseline and stable Postop Assessment: no apparent nausea or vomiting Anesthetic complications: no    Last Vitals:  Vitals:   02/18/17 1605 02/18/17 1625  BP: (!) 148/70 136/77  Pulse: 65 68  Resp: 16 16  Temp: 36.7 C   SpO2: 95% 95%    Last Pain:  Vitals:   02/18/17 1625  TempSrc:   PainSc: 0-No pain                 Rayen Dafoe

## 2017-02-20 DIAGNOSIS — E782 Mixed hyperlipidemia: Secondary | ICD-10-CM | POA: Diagnosis not present

## 2017-02-20 DIAGNOSIS — Z992 Dependence on renal dialysis: Secondary | ICD-10-CM | POA: Diagnosis not present

## 2017-02-20 DIAGNOSIS — E1129 Type 2 diabetes mellitus with other diabetic kidney complication: Secondary | ICD-10-CM | POA: Diagnosis not present

## 2017-02-20 DIAGNOSIS — N186 End stage renal disease: Secondary | ICD-10-CM | POA: Diagnosis not present

## 2017-02-21 DIAGNOSIS — I5043 Acute on chronic combined systolic (congestive) and diastolic (congestive) heart failure: Secondary | ICD-10-CM | POA: Diagnosis not present

## 2017-02-21 DIAGNOSIS — I270 Primary pulmonary hypertension: Secondary | ICD-10-CM | POA: Diagnosis not present

## 2017-02-21 DIAGNOSIS — N186 End stage renal disease: Secondary | ICD-10-CM | POA: Diagnosis not present

## 2017-02-21 DIAGNOSIS — R0602 Shortness of breath: Secondary | ICD-10-CM | POA: Diagnosis not present

## 2017-02-21 DIAGNOSIS — G4733 Obstructive sleep apnea (adult) (pediatric): Secondary | ICD-10-CM | POA: Diagnosis not present

## 2017-02-21 DIAGNOSIS — J449 Chronic obstructive pulmonary disease, unspecified: Secondary | ICD-10-CM | POA: Diagnosis not present

## 2017-02-21 DIAGNOSIS — N2581 Secondary hyperparathyroidism of renal origin: Secondary | ICD-10-CM | POA: Diagnosis not present

## 2017-02-21 DIAGNOSIS — R0901 Asphyxia: Secondary | ICD-10-CM | POA: Diagnosis not present

## 2017-02-23 DIAGNOSIS — E782 Mixed hyperlipidemia: Secondary | ICD-10-CM | POA: Diagnosis not present

## 2017-02-24 DIAGNOSIS — N2581 Secondary hyperparathyroidism of renal origin: Secondary | ICD-10-CM | POA: Diagnosis not present

## 2017-02-24 DIAGNOSIS — N186 End stage renal disease: Secondary | ICD-10-CM | POA: Diagnosis not present

## 2017-02-24 DIAGNOSIS — E782 Mixed hyperlipidemia: Secondary | ICD-10-CM | POA: Diagnosis not present

## 2017-02-25 DIAGNOSIS — E782 Mixed hyperlipidemia: Secondary | ICD-10-CM | POA: Diagnosis not present

## 2017-02-26 DIAGNOSIS — N186 End stage renal disease: Secondary | ICD-10-CM | POA: Diagnosis not present

## 2017-02-26 DIAGNOSIS — E782 Mixed hyperlipidemia: Secondary | ICD-10-CM | POA: Diagnosis not present

## 2017-02-26 DIAGNOSIS — N2581 Secondary hyperparathyroidism of renal origin: Secondary | ICD-10-CM | POA: Diagnosis not present

## 2017-02-27 DIAGNOSIS — E782 Mixed hyperlipidemia: Secondary | ICD-10-CM | POA: Diagnosis not present

## 2017-02-28 DIAGNOSIS — N186 End stage renal disease: Secondary | ICD-10-CM | POA: Diagnosis not present

## 2017-02-28 DIAGNOSIS — N2581 Secondary hyperparathyroidism of renal origin: Secondary | ICD-10-CM | POA: Diagnosis not present

## 2017-03-02 DIAGNOSIS — E782 Mixed hyperlipidemia: Secondary | ICD-10-CM | POA: Diagnosis not present

## 2017-03-03 DIAGNOSIS — N186 End stage renal disease: Secondary | ICD-10-CM | POA: Diagnosis not present

## 2017-03-03 DIAGNOSIS — N2581 Secondary hyperparathyroidism of renal origin: Secondary | ICD-10-CM | POA: Diagnosis not present

## 2017-03-03 DIAGNOSIS — E782 Mixed hyperlipidemia: Secondary | ICD-10-CM | POA: Diagnosis not present

## 2017-03-04 ENCOUNTER — Encounter: Payer: Self-pay | Admitting: Pulmonary Disease

## 2017-03-04 ENCOUNTER — Ambulatory Visit (INDEPENDENT_AMBULATORY_CARE_PROVIDER_SITE_OTHER): Payer: Medicare HMO | Admitting: Pulmonary Disease

## 2017-03-04 DIAGNOSIS — Z9989 Dependence on other enabling machines and devices: Secondary | ICD-10-CM | POA: Diagnosis not present

## 2017-03-04 DIAGNOSIS — E782 Mixed hyperlipidemia: Secondary | ICD-10-CM | POA: Diagnosis not present

## 2017-03-04 DIAGNOSIS — G4733 Obstructive sleep apnea (adult) (pediatric): Secondary | ICD-10-CM

## 2017-03-04 NOTE — Assessment & Plan Note (Signed)
She has lost significant weight since her original study and that is why probably the degree of sleep disordered breathing has decreased.  She only has mild OSA now, predominantly during REM sleep. Also note that supine sleep was not noted during the study. The cardiovascular implications of mild OSA is minimal.  We discussed treatment options including getting a new CPAP machine.  She would like to avoid this as of now. I did not push the issue given the mild nature of disease.  Certainly if she develops worsening heart failure or repeated hospital admissions we can reconsider.  Meantime I have asked her to use her CPAP machine that she already has at home as much as possible  We will see her in follow-up on an as-needed basis

## 2017-03-04 NOTE — Progress Notes (Signed)
Subjective:    Patient ID: [name], female    DOB: Apr 29, 1945, 72 y.o.   MRN: [id number]  HPI  72 year old obese woman on dialysis presents for evaluation of sleep disordered breathing. She carries a diagnosis of obstructive sleep apnea and was provided with a CPAP many years ago.  I am unable to locate this old sleep study.  She states that she is to weigh as high as  400 pounds then.  She could not feel a subjective difference after starting CPAP therapy but is sporadically compliant with this using it about 2-3 times a week. In the meantime she developed end-stage renal disease and was started on dialysis 6 years ago.  She just has had revision of her left upper arm graft and is currently being dialyzed via left permacath on Tuesday/Thursday/Saturday-Dr. Florene Glen. She was hospitalized 03/7626 for acute systolic and diastolic heart failure, CT chest suggesting emphysema, PFTs were reviewed which show no evidence of airway obstruction and mild decrease in DLCO which could be related to heart failure or emphysema.  She has only smoked less than 10 pack years and quit in the 80s.  She has lost significant weight to her current weight of 270 pounds. Sleep study was obtained which only showed mild sleep disordered breathing with AHI 7.8/hour, predominantly during REM sleep.  No supine sleep was noted.  No significant limb movements are noted  Epworth sleepiness score is 3 and she denies excessive daytime somnolence.  She does report fatigue on days of dialysis. Bedtime varies anywhere from 11:50, sleep latency is minimal, sleeps on her side with 2 pillows, reports 1-2 nocturnal awakenings, no nocturia and is out of bed by 8 AM feeling tired with occasional dryness of mouth but denies headaches.  There is no history suggestive of cataplexy, sleep paralysis or parasomnias   Past Medical History:  Diagnosis Date  . Abdominal abscess 12-17-10   abdominal abscesses x2 ? one at this time  .  Anemia   . Arthritis    "all over" (08/23/2012)  . Blind right eye    fell and crushed socket and eye fell out, was replaced at [facility]  . CHF (congestive heart failure) ([place])   . Chronic lower back pain   . Coronary artery disease    normal coronaries by 08/25/12 cath  . Dyspnea   . Dysrhythmia    irregular, "skips beats"  . ESRD (end stage renal disease) on dialysis ([facility])    "started 12/2011; [place]; TTS" (08/23/2012  . Exertional shortness of breath   . Eye drainage    "lots; since fall 07/2011" (08/23/2012)  . Gout   . H/O hiatal hernia   . Headache(784.0)    "weekly sometimes; since I fell and hit my head in 07/2011 08/23/2012)  . Heart murmur   . History of blood transfusion    "lots before starting dialysis" (08/23/2012)  . Hypertension    sees Dr. Willey Blade  . Inhalation injury    "worked at [employer]; can't inhale polyurethane or paint, etc" (08/23/2012)  . Myocardial infarction ([place]) 1970's or 1980's  . Neuromuscular disorder ([place])    diabetic neuropathy  . OSA on CPAP   . Staph aureus infection November 2012   . Swelling of both ankles 12-17-10  . Type II diabetes mellitus ([place])    "over 10 years now" (08/23/2012)   Past Surgical History:  Procedure Laterality Date  . APPENDECTOMY    . AV FISTULA PLACEMENT  09/30/2011  Procedure: ARTERIOVENOUS (AV) FISTULA CREATION;  Surgeon: Conrad Iatan, MD;  Location: [facility];  Service: Vascular;  Laterality: Left;  BRACHIAL-CEPHALIC  . BACK SURGERY    . BASCILIC VEIN TRANSPOSITION Left 11/10/2011   Left arm   . [place] TRANSPOSITION  01/28/2012   Procedure: BASCILIC VEIN TRANSPOSITION;  Surgeon: Conrad Troy, MD;  Location: [place];  Service: Vascular;  Laterality: Left;  Second Stage   . CARDIAC CATHETERIZATION  1980's  . CATARACT EXTRACTION W/ INTRAOCULAR LENS IMPLANT Bilateral 1990's  . DILATION AND CURETTAGE OF UTERUS  1960's  . EYE SURGERY    . FISTULA SUPERFICIALIZATION Left 02/18/2017   Procedure:  SUPERFICIALIZATION LEFT ARM ARTERIOVENOUS FISTULA;  Surgeon: Serafina Mitchell, MD;  Location: [place];  Service: Vascular;  Laterality: Left;  . HERNIA REPAIR    . INSERTION OF DIALYSIS CATHETER  01/05/2012   Procedure: INSERTION OF DIALYSIS CATHETER;  Surgeon: Conrad Parkin, MD;  Location: [place];  Service: Vascular;  Laterality: N/A;  Right Internal Jugular Placement  . INTRAOCULAR PROSTHESES INSERTION Right 2013   "fell; knocked my eye outl" (08/23/2012)  . JOINT REPLACEMENT    . LAPAROTOMY  12/18/2010   Procedure: EXPLORATORY LAPAROTOMY;  Surgeon: Rolm Bookbinder, MD;  Location: [facility];  Service: General;  Laterality: N/A;  Abdominal Seroma Evacuation  . LEFT HEART CATHETERIZATION WITH CORONARY ANGIOGRAM N/A 08/25/2012   Procedure: LEFT HEART CATHETERIZATION WITH CORONARY ANGIOGRAM;  Surgeon: Birdie Riddle, MD;  Location: [facility];  Service: Cardiovascular;  Laterality: N/A;  . LUMBAR DISC SURGERY  1970'-80's   X 3  . TOTAL KNEE ARTHROPLASTY Left 1980's  . VAGINAL HYSTERECTOMY  1970's  . VENTRAL HERNIA REPAIR  2012-2013   component separation, repair with biologic; "had 3 surgeries to fix it" (08/23/2012)  . WOUND DEBRIDEMENT  03/20/2011   Procedure: DEBRIDEMENT ABDOMINAL WOUND;  Surgeon: Rolm Bookbinder, MD;  Location: [place];  Service: General;  Laterality: N/A;  debridement abdominal wall, placement of wound vac    Allergies  Allergen Reactions  . Lyrica [Pregabalin] Other (See Comments)    Hallucinations    Social History   Socioeconomic History  . Marital status: Widowed    Spouse name: Not on file  . Number of children: Not on file  . Years of education: Not on file  . Highest education level: Not on file  Social Needs  . Financial resource strain: Not on file  . Food insecurity - worry: Not on file  . Food insecurity - inability: Not on file  . Transportation needs - medical: Not on file  . Transportation needs - non-medical: Not on file  Occupational History  . Not  on file  Tobacco Use  . Smoking status: Former Smoker    Packs/day: 0.25    Years: 16.00    Pack years: 4.00    Types: Cigarettes    Last attempt to quit: 12/16/1980    Years since quitting: 36.2  . Smokeless tobacco: Never Used  Substance and Sexual Activity  . Alcohol use: No  . Drug use: No  . Sexual activity: No  Other Topics Concern  . Not on file  Social History Narrative   ** Merged History Encounter **         Family History  Problem Relation Age of Onset  . Hypertension Mother   . Cancer Brother        spine  . Hypertension Father   . Cancer Sister  brain  . Hypertension Maternal Grandmother   . Anesthesia problems Neg Hx      Review of Systems neg for any significant sore throat, dysphagia, itching, sneezing, nasal congestion or excess/ purulent secretions, fever, chills, sweats, unintended wt loss, pleuritic or exertional cp, hempoptysis, orthopnea pnd or change in chronic leg swelling.  Also denies presyncope, palpitations, heartburn, abdominal pain, nausea, vomiting, diarrhea or change in bowel or urinary habits, dysuria,hematuria, rash, arthralgias, visual complaints, headache, numbness weakness or ataxia.     Objective:   Physical Exam  Gen. Pleasant, obese, in no distress, normal affect ENT - no lesions, no post nasal drip, class 2-3 airway Neck: No JVD, no thyromegaly, no carotid bruits Lungs: left permacath, no use of accessory muscles, no dullness to percussion, decreased without rales or rhonchi  Cardiovascular: Rhythm regular, heart sounds  normal, no murmurs or gallops, no peripheral edema Abdomen: soft and non-tender, no hepatosplenomegaly, BS normal. Musculoskeletal: No deformities, no cyanosis or clubbing, LUE graft - no thrill Neuro:  alert, non focal, no tremors       Assessment & Plan:

## 2017-03-04 NOTE — Patient Instructions (Signed)
You have mild obstructive sleep apnea. This has questionable effects on the heart. Based on our discussion, we will hold off on getting CPAP therapy. Certainly if your heart failure gets worse, we may have to reconsider this

## 2017-03-05 DIAGNOSIS — N186 End stage renal disease: Secondary | ICD-10-CM | POA: Diagnosis not present

## 2017-03-05 DIAGNOSIS — N2581 Secondary hyperparathyroidism of renal origin: Secondary | ICD-10-CM | POA: Diagnosis not present

## 2017-03-05 DIAGNOSIS — E782 Mixed hyperlipidemia: Secondary | ICD-10-CM | POA: Diagnosis not present

## 2017-03-06 DIAGNOSIS — E782 Mixed hyperlipidemia: Secondary | ICD-10-CM | POA: Diagnosis not present

## 2017-03-07 DIAGNOSIS — N186 End stage renal disease: Secondary | ICD-10-CM | POA: Diagnosis not present

## 2017-03-07 DIAGNOSIS — N2581 Secondary hyperparathyroidism of renal origin: Secondary | ICD-10-CM | POA: Diagnosis not present

## 2017-03-09 ENCOUNTER — Ambulatory Visit (INDEPENDENT_AMBULATORY_CARE_PROVIDER_SITE_OTHER): Payer: Medicare HMO | Admitting: Surgery

## 2017-03-09 ENCOUNTER — Encounter: Payer: Self-pay | Admitting: Surgery

## 2017-03-09 ENCOUNTER — Other Ambulatory Visit: Payer: Self-pay

## 2017-03-09 ENCOUNTER — Encounter: Payer: Medicare HMO | Admitting: Surgery

## 2017-03-09 VITALS — BP 142/70 | HR 74 | Temp 98.4°F | Resp 22 | Ht 66.0 in | Wt 279.0 lb

## 2017-03-09 DIAGNOSIS — N186 End stage renal disease: Secondary | ICD-10-CM

## 2017-03-09 DIAGNOSIS — Z992 Dependence on renal dialysis: Secondary | ICD-10-CM

## 2017-03-09 DIAGNOSIS — E782 Mixed hyperlipidemia: Secondary | ICD-10-CM | POA: Diagnosis not present

## 2017-03-09 NOTE — Progress Notes (Signed)
This note was faxed to [facility] at [phone]. Also gave verbal approval to start cannulating this AVF in 2 weeks.

## 2017-03-09 NOTE — Progress Notes (Signed)
    Postoperative Access Visit   History of Present Illness   [name] is a 72 y.o. year old female who presents for postoperative follow-up for: left arm elevation of transposed brachiobasilic fistula (Date: 05/03/21).  The patient's wounds are well healed.  The patient has occasional numbness in L hand digits 3-5 and occasional cold feeling of L hand however this has been stable for the last couple years.  The patient is  able to complete their activities of daily living. She is currently dialyzing from L IJ TDC.  Physical Examination   Vitals:   03/09/17 1509  BP: (!) 142/70  Pulse: 74  Resp: (!) 22  Temp: 98.4 F (36.9 C)  TempSrc: Oral  SpO2: 97%  Weight: 279 lb (126.6 kg)  Height: 5\' 6"  (1.676 m)   Body mass index is 45.03 kg/m.  left arm Incision is well healed, hand grip is 4/5, sensation in digits is  intact, palpable thrill, bruit can be auscultated     Medical Decision Making   [name] is a 72 y.o. year old female who presents s/p left arm elevation of transposed brachiobasilic fistula   Encouraged soap and water on incision site to remove remaining glue.  Continue HD from dialysis catheter  Ok to use fistula in 2 more weeks  The patient's tunneled dialysis catheter can be removed after two successful cannulations and completed dialysis treatments.  Thank you for allowing [place] to participate in this patient's care.   Dagoberto Ligas, PA-C [facility] Office: [phone]

## 2017-03-10 DIAGNOSIS — E782 Mixed hyperlipidemia: Secondary | ICD-10-CM | POA: Diagnosis not present

## 2017-03-10 DIAGNOSIS — N2581 Secondary hyperparathyroidism of renal origin: Secondary | ICD-10-CM | POA: Diagnosis not present

## 2017-03-10 DIAGNOSIS — N186 End stage renal disease: Secondary | ICD-10-CM | POA: Diagnosis not present

## 2017-03-11 DIAGNOSIS — E782 Mixed hyperlipidemia: Secondary | ICD-10-CM | POA: Diagnosis not present

## 2017-03-12 DIAGNOSIS — N186 End stage renal disease: Secondary | ICD-10-CM | POA: Diagnosis not present

## 2017-03-12 DIAGNOSIS — E782 Mixed hyperlipidemia: Secondary | ICD-10-CM | POA: Diagnosis not present

## 2017-03-12 DIAGNOSIS — N2581 Secondary hyperparathyroidism of renal origin: Secondary | ICD-10-CM | POA: Diagnosis not present

## 2017-03-13 DIAGNOSIS — E782 Mixed hyperlipidemia: Secondary | ICD-10-CM | POA: Diagnosis not present

## 2017-03-14 DIAGNOSIS — N186 End stage renal disease: Secondary | ICD-10-CM | POA: Diagnosis not present

## 2017-03-14 DIAGNOSIS — N2581 Secondary hyperparathyroidism of renal origin: Secondary | ICD-10-CM | POA: Diagnosis not present

## 2017-03-16 DIAGNOSIS — E782 Mixed hyperlipidemia: Secondary | ICD-10-CM | POA: Diagnosis not present

## 2017-03-17 DIAGNOSIS — E782 Mixed hyperlipidemia: Secondary | ICD-10-CM | POA: Diagnosis not present

## 2017-03-17 DIAGNOSIS — N186 End stage renal disease: Secondary | ICD-10-CM | POA: Diagnosis not present

## 2017-03-17 DIAGNOSIS — N2581 Secondary hyperparathyroidism of renal origin: Secondary | ICD-10-CM | POA: Diagnosis not present

## 2017-03-18 DIAGNOSIS — E782 Mixed hyperlipidemia: Secondary | ICD-10-CM | POA: Diagnosis not present

## 2017-03-19 DIAGNOSIS — N186 End stage renal disease: Secondary | ICD-10-CM | POA: Diagnosis not present

## 2017-03-19 DIAGNOSIS — N2581 Secondary hyperparathyroidism of renal origin: Secondary | ICD-10-CM | POA: Diagnosis not present

## 2017-03-19 DIAGNOSIS — E782 Mixed hyperlipidemia: Secondary | ICD-10-CM | POA: Diagnosis not present

## 2017-03-20 DIAGNOSIS — E1129 Type 2 diabetes mellitus with other diabetic kidney complication: Secondary | ICD-10-CM | POA: Diagnosis not present

## 2017-03-20 DIAGNOSIS — N186 End stage renal disease: Secondary | ICD-10-CM | POA: Diagnosis not present

## 2017-03-20 DIAGNOSIS — E782 Mixed hyperlipidemia: Secondary | ICD-10-CM | POA: Diagnosis not present

## 2017-03-20 DIAGNOSIS — Z992 Dependence on renal dialysis: Secondary | ICD-10-CM | POA: Diagnosis not present

## 2017-03-21 DIAGNOSIS — G4733 Obstructive sleep apnea (adult) (pediatric): Secondary | ICD-10-CM | POA: Diagnosis not present

## 2017-03-21 DIAGNOSIS — I270 Primary pulmonary hypertension: Secondary | ICD-10-CM | POA: Diagnosis not present

## 2017-03-21 DIAGNOSIS — J449 Chronic obstructive pulmonary disease, unspecified: Secondary | ICD-10-CM | POA: Diagnosis not present

## 2017-03-21 DIAGNOSIS — N186 End stage renal disease: Secondary | ICD-10-CM | POA: Diagnosis not present

## 2017-03-21 DIAGNOSIS — R0901 Asphyxia: Secondary | ICD-10-CM | POA: Diagnosis not present

## 2017-03-21 DIAGNOSIS — I5043 Acute on chronic combined systolic (congestive) and diastolic (congestive) heart failure: Secondary | ICD-10-CM | POA: Diagnosis not present

## 2017-03-21 DIAGNOSIS — N2581 Secondary hyperparathyroidism of renal origin: Secondary | ICD-10-CM | POA: Diagnosis not present

## 2017-03-21 DIAGNOSIS — R0602 Shortness of breath: Secondary | ICD-10-CM | POA: Diagnosis not present

## 2017-03-23 DIAGNOSIS — E782 Mixed hyperlipidemia: Secondary | ICD-10-CM | POA: Diagnosis not present

## 2017-03-24 DIAGNOSIS — N186 End stage renal disease: Secondary | ICD-10-CM | POA: Diagnosis not present

## 2017-03-24 DIAGNOSIS — E782 Mixed hyperlipidemia: Secondary | ICD-10-CM | POA: Diagnosis not present

## 2017-03-24 DIAGNOSIS — N2581 Secondary hyperparathyroidism of renal origin: Secondary | ICD-10-CM | POA: Diagnosis not present

## 2017-03-25 DIAGNOSIS — E782 Mixed hyperlipidemia: Secondary | ICD-10-CM | POA: Diagnosis not present

## 2017-03-26 DIAGNOSIS — N186 End stage renal disease: Secondary | ICD-10-CM | POA: Diagnosis not present

## 2017-03-26 DIAGNOSIS — N2581 Secondary hyperparathyroidism of renal origin: Secondary | ICD-10-CM | POA: Diagnosis not present

## 2017-03-26 DIAGNOSIS — E782 Mixed hyperlipidemia: Secondary | ICD-10-CM | POA: Diagnosis not present

## 2017-03-27 DIAGNOSIS — E782 Mixed hyperlipidemia: Secondary | ICD-10-CM | POA: Diagnosis not present

## 2017-03-28 DIAGNOSIS — N186 End stage renal disease: Secondary | ICD-10-CM | POA: Diagnosis not present

## 2017-03-28 DIAGNOSIS — N2581 Secondary hyperparathyroidism of renal origin: Secondary | ICD-10-CM | POA: Diagnosis not present

## 2017-03-30 DIAGNOSIS — E782 Mixed hyperlipidemia: Secondary | ICD-10-CM | POA: Diagnosis not present

## 2017-03-31 DIAGNOSIS — E782 Mixed hyperlipidemia: Secondary | ICD-10-CM | POA: Diagnosis not present

## 2017-03-31 DIAGNOSIS — N186 End stage renal disease: Secondary | ICD-10-CM | POA: Diagnosis not present

## 2017-03-31 DIAGNOSIS — N2581 Secondary hyperparathyroidism of renal origin: Secondary | ICD-10-CM | POA: Diagnosis not present

## 2017-04-01 DIAGNOSIS — E782 Mixed hyperlipidemia: Secondary | ICD-10-CM | POA: Diagnosis not present

## 2017-04-02 ENCOUNTER — Inpatient Hospital Stay (HOSPITAL_COMMUNITY)
Admission: EM | Admit: 2017-04-02 | Discharge: 2017-04-09 | DRG: 286 | Disposition: A | Discharge status: Home / Self Care | Payer: Medicare HMO | Attending: Cardiology | Admitting: Cardiology

## 2017-04-02 ENCOUNTER — Other Ambulatory Visit: Payer: Self-pay

## 2017-04-02 ENCOUNTER — Emergency Department (HOSPITAL_COMMUNITY): Payer: Medicare HMO

## 2017-04-02 ENCOUNTER — Encounter (HOSPITAL_COMMUNITY): Payer: Self-pay

## 2017-04-02 DIAGNOSIS — N2581 Secondary hyperparathyroidism of renal origin: Secondary | ICD-10-CM | POA: Diagnosis not present

## 2017-04-02 DIAGNOSIS — J440 Chronic obstructive pulmonary disease with acute lower respiratory infection: Secondary | ICD-10-CM | POA: Diagnosis present

## 2017-04-02 DIAGNOSIS — I120 Hypertensive chronic kidney disease with stage 5 chronic kidney disease or end stage renal disease: Secondary | ICD-10-CM | POA: Diagnosis not present

## 2017-04-02 DIAGNOSIS — I132 Hypertensive heart and chronic kidney disease with heart failure and with stage 5 chronic kidney disease, or end stage renal disease: Principal | ICD-10-CM | POA: Diagnosis present

## 2017-04-02 DIAGNOSIS — K449 Diaphragmatic hernia without obstruction or gangrene: Secondary | ICD-10-CM | POA: Diagnosis present

## 2017-04-02 DIAGNOSIS — D638 Anemia in other chronic diseases classified elsewhere: Secondary | ICD-10-CM | POA: Diagnosis present

## 2017-04-02 DIAGNOSIS — Z809 Family history of malignant neoplasm, unspecified: Secondary | ICD-10-CM

## 2017-04-02 DIAGNOSIS — I200 Unstable angina: Secondary | ICD-10-CM | POA: Diagnosis present

## 2017-04-02 DIAGNOSIS — Z90710 Acquired absence of both cervix and uterus: Secondary | ICD-10-CM | POA: Diagnosis not present

## 2017-04-02 DIAGNOSIS — I25110 Atherosclerotic heart disease of native coronary artery with unstable angina pectoris: Secondary | ICD-10-CM | POA: Diagnosis present

## 2017-04-02 DIAGNOSIS — G8929 Other chronic pain: Secondary | ICD-10-CM | POA: Diagnosis present

## 2017-04-02 DIAGNOSIS — R011 Cardiac murmur, unspecified: Secondary | ICD-10-CM | POA: Diagnosis present

## 2017-04-02 DIAGNOSIS — D631 Anemia in chronic kidney disease: Secondary | ICD-10-CM | POA: Diagnosis not present

## 2017-04-02 DIAGNOSIS — Z888 Allergy status to other drugs, medicaments and biological substances status: Secondary | ICD-10-CM

## 2017-04-02 DIAGNOSIS — I509 Heart failure, unspecified: Secondary | ICD-10-CM | POA: Diagnosis not present

## 2017-04-02 DIAGNOSIS — E1140 Type 2 diabetes mellitus with diabetic neuropathy, unspecified: Secondary | ICD-10-CM | POA: Diagnosis present

## 2017-04-02 DIAGNOSIS — J208 Acute bronchitis due to other specified organisms: Secondary | ICD-10-CM | POA: Diagnosis not present

## 2017-04-02 DIAGNOSIS — R079 Chest pain, unspecified: Secondary | ICD-10-CM | POA: Diagnosis not present

## 2017-04-02 DIAGNOSIS — J209 Acute bronchitis, unspecified: Secondary | ICD-10-CM | POA: Diagnosis not present

## 2017-04-02 DIAGNOSIS — Z992 Dependence on renal dialysis: Secondary | ICD-10-CM | POA: Diagnosis not present

## 2017-04-02 DIAGNOSIS — Z79899 Other long term (current) drug therapy: Secondary | ICD-10-CM

## 2017-04-02 DIAGNOSIS — Z9841 Cataract extraction status, right eye: Secondary | ICD-10-CM

## 2017-04-02 DIAGNOSIS — D649 Anemia, unspecified: Secondary | ICD-10-CM | POA: Diagnosis not present

## 2017-04-02 DIAGNOSIS — E875 Hyperkalemia: Secondary | ICD-10-CM | POA: Diagnosis not present

## 2017-04-02 DIAGNOSIS — G4733 Obstructive sleep apnea (adult) (pediatric): Secondary | ICD-10-CM | POA: Diagnosis present

## 2017-04-02 DIAGNOSIS — J449 Chronic obstructive pulmonary disease, unspecified: Secondary | ICD-10-CM | POA: Diagnosis not present

## 2017-04-02 DIAGNOSIS — E8889 Other specified metabolic disorders: Secondary | ICD-10-CM | POA: Diagnosis present

## 2017-04-02 DIAGNOSIS — E782 Mixed hyperlipidemia: Secondary | ICD-10-CM | POA: Diagnosis not present

## 2017-04-02 DIAGNOSIS — I2510 Atherosclerotic heart disease of native coronary artery without angina pectoris: Secondary | ICD-10-CM | POA: Diagnosis not present

## 2017-04-02 DIAGNOSIS — M109 Gout, unspecified: Secondary | ICD-10-CM | POA: Diagnosis present

## 2017-04-02 DIAGNOSIS — Z6841 Body Mass Index (BMI) 40.0 and over, adult: Secondary | ICD-10-CM | POA: Diagnosis not present

## 2017-04-02 DIAGNOSIS — Z96652 Presence of left artificial knee joint: Secondary | ICD-10-CM | POA: Diagnosis present

## 2017-04-02 DIAGNOSIS — I5023 Acute on chronic systolic (congestive) heart failure: Secondary | ICD-10-CM | POA: Diagnosis not present

## 2017-04-02 DIAGNOSIS — E1122 Type 2 diabetes mellitus with diabetic chronic kidney disease: Secondary | ICD-10-CM | POA: Diagnosis present

## 2017-04-02 DIAGNOSIS — H5461 Unqualified visual loss, right eye, normal vision left eye: Secondary | ICD-10-CM | POA: Diagnosis present

## 2017-04-02 DIAGNOSIS — Z961 Presence of intraocular lens: Secondary | ICD-10-CM | POA: Diagnosis present

## 2017-04-02 DIAGNOSIS — I459 Conduction disorder, unspecified: Secondary | ICD-10-CM | POA: Diagnosis present

## 2017-04-02 DIAGNOSIS — Z9842 Cataract extraction status, left eye: Secondary | ICD-10-CM

## 2017-04-02 DIAGNOSIS — M545 Low back pain: Secondary | ICD-10-CM | POA: Diagnosis present

## 2017-04-02 DIAGNOSIS — Z7982 Long term (current) use of aspirin: Secondary | ICD-10-CM

## 2017-04-02 DIAGNOSIS — Z79891 Long term (current) use of opiate analgesic: Secondary | ICD-10-CM

## 2017-04-02 DIAGNOSIS — Z9861 Coronary angioplasty status: Secondary | ICD-10-CM

## 2017-04-02 DIAGNOSIS — I5031 Acute diastolic (congestive) heart failure: Secondary | ICD-10-CM | POA: Diagnosis not present

## 2017-04-02 DIAGNOSIS — Z8249 Family history of ischemic heart disease and other diseases of the circulatory system: Secondary | ICD-10-CM

## 2017-04-02 DIAGNOSIS — Z794 Long term (current) use of insulin: Secondary | ICD-10-CM

## 2017-04-02 DIAGNOSIS — I959 Hypotension, unspecified: Secondary | ICD-10-CM | POA: Diagnosis present

## 2017-04-02 DIAGNOSIS — N186 End stage renal disease: Secondary | ICD-10-CM | POA: Diagnosis not present

## 2017-04-02 DIAGNOSIS — Z808 Family history of malignant neoplasm of other organs or systems: Secondary | ICD-10-CM

## 2017-04-02 DIAGNOSIS — Z87891 Personal history of nicotine dependence: Secondary | ICD-10-CM

## 2017-04-02 DIAGNOSIS — Z9689 Presence of other specified functional implants: Secondary | ICD-10-CM | POA: Diagnosis present

## 2017-04-02 LAB — HEMOGLOBIN A1C
HEMOGLOBIN A1C: 5.6 % (ref 4.8–5.6)
MEAN PLASMA GLUCOSE: 114.02 mg/dL

## 2017-04-02 LAB — BASIC METABOLIC PANEL
Anion gap: 14 (ref 5–15)
BUN: 24 mg/dL — ABNORMAL HIGH (ref 6–20)
CO2: 25 mmol/L (ref 22–32)
Calcium: 8 mg/dL — ABNORMAL LOW (ref 8.9–10.3)
Chloride: 97 mmol/L — ABNORMAL LOW (ref 101–111)
Creatinine, Ser: 5.14 mg/dL — ABNORMAL HIGH (ref 0.44–1.00)
GFR calc Af Amer: 9 mL/min — ABNORMAL LOW (ref >60)
GFR calc non Af Amer: 8 mL/min — ABNORMAL LOW (ref >60)
Glucose, Bld: 118 mg/dL — ABNORMAL HIGH (ref 65–99)
Potassium: 4.7 mmol/L (ref 3.5–5.1)
Sodium: 136 mmol/L (ref 135–145)

## 2017-04-02 LAB — TROPONIN I: Troponin I: <0.03 ng/mL (ref <0.03)

## 2017-04-02 LAB — CBC
HCT: 34 % — ABNORMAL LOW (ref 36.0–46.0)
Hemoglobin: 10.5 g/dL — ABNORMAL LOW (ref 12.0–15.0)
MCH: 26.6 pg (ref 26.0–34.0)
MCHC: 30.9 g/dL (ref 30.0–36.0)
MCV: 86.3 fL (ref 78.0–100.0)
Platelets: 228 10*3/uL (ref 150–400)
RBC: 3.94 MIL/uL (ref 3.87–5.11)
RDW: 21.1 % — ABNORMAL HIGH (ref 11.5–15.5)
WBC: 8 10*3/uL (ref 4.0–10.5)

## 2017-04-02 LAB — GLUCOSE, CAPILLARY: GLUCOSE-CAPILLARY: 153 mg/dL — AB (ref 65–99)

## 2017-04-02 LAB — MAGNESIUM: Magnesium: 2 mg/dL (ref 1.7–2.4)

## 2017-04-02 LAB — I-STAT TROPONIN, ED: Troponin i, poc: 0 ng/mL (ref 0.00–0.08)

## 2017-04-02 MED ORDER — NITROGLYCERIN 0.4 MG SL SUBL: 0.4000 mg | SUBLINGUAL_TABLET | SUBLINGUAL | Status: DC | PRN

## 2017-04-02 MED ORDER — METOPROLOL TARTRATE 12.5 MG HALF TABLET
12.5000 mg | ORAL_TABLET | Freq: Two times a day (BID) | ORAL | Status: DC
  Administered 2017-04-02 – 2017-04-09 (×11): 12.5 mg via ORAL
  Filled 2017-04-02 (×12): qty 1

## 2017-04-02 MED ORDER — MORPHINE SULFATE (PF) 4 MG/ML IV SOLN
4.0000 mg | Freq: Once | INTRAVENOUS | Status: AC
  Administered 2017-04-02: 4 mg via INTRAVENOUS
  Filled 2017-04-02: qty 1

## 2017-04-02 MED ORDER — TIOTROPIUM BROMIDE MONOHYDRATE 18 MCG IN CAPS
18.0000 ug | ORAL_CAPSULE | Freq: Every day | RESPIRATORY_TRACT | Status: DC
  Administered 2017-04-05 – 2017-04-09 (×4): 18 ug via RESPIRATORY_TRACT
  Filled 2017-04-02 (×2): qty 5

## 2017-04-02 MED ORDER — ASPIRIN EC 81 MG PO TBEC: 81.0000 mg | DELAYED_RELEASE_TABLET | Freq: Every day | ORAL | Status: DC

## 2017-04-02 MED ORDER — ALBUTEROL SULFATE (2.5 MG/3ML) 0.083% IN NEBU
2.5000 mg | INHALATION_SOLUTION | RESPIRATORY_TRACT | Status: DC | PRN
Start: 2017-04-02 — End: 2017-04-03

## 2017-04-02 MED ORDER — ONDANSETRON HCL 4 MG/2ML IJ SOLN
4.0000 mg | Freq: Four times a day (QID) | INTRAMUSCULAR | Status: DC | PRN
Start: 2017-04-02 — End: 2017-04-09

## 2017-04-02 MED ORDER — ALBUTEROL SULFATE HFA 108 (90 BASE) MCG/ACT IN AERS: 2.0000 | INHALATION_SPRAY | RESPIRATORY_TRACT | Status: DC | PRN

## 2017-04-02 MED ORDER — ASPIRIN EC 81 MG PO TBEC
81.0000 mg | DELAYED_RELEASE_TABLET | Freq: Every day | ORAL | Status: DC
  Administered 2017-04-03 – 2017-04-09 (×7): 81 mg via ORAL
  Filled 2017-04-02 (×8): qty 1

## 2017-04-02 MED ORDER — INSULIN ASPART 100 UNIT/ML ~~LOC~~ SOLN
0.0000 [IU] | Freq: Three times a day (TID) | SUBCUTANEOUS | Status: DC
  Administered 2017-04-03 – 2017-04-06 (×4): 2 [IU] via SUBCUTANEOUS
  Administered 2017-04-08: 1 [IU] via SUBCUTANEOUS

## 2017-04-02 MED ORDER — ATORVASTATIN CALCIUM 40 MG PO TABS
40.0000 mg | ORAL_TABLET | Freq: Every day | ORAL | Status: DC
  Administered 2017-04-02 – 2017-04-08 (×7): 40 mg via ORAL
  Filled 2017-04-02 (×8): qty 1

## 2017-04-02 MED ORDER — HEPARIN BOLUS VIA INFUSION
4000.0000 [IU] | Freq: Once | INTRAVENOUS | Status: AC
  Administered 2017-04-02: 4000 [IU] via INTRAVENOUS
  Filled 2017-04-02: qty 4000

## 2017-04-02 MED ORDER — ASPIRIN 81 MG PO CHEW: 324.0000 mg | CHEWABLE_TABLET | ORAL | Status: AC

## 2017-04-02 MED ORDER — NITROGLYCERIN 2 % TD OINT
0.5000 [in_us] | TOPICAL_OINTMENT | Freq: Three times a day (TID) | TRANSDERMAL | Status: DC
  Administered 2017-04-02 – 2017-04-09 (×21): 0.5 [in_us] via TOPICAL
  Filled 2017-04-02 (×10): qty 30

## 2017-04-02 MED ORDER — SEVELAMER CARBONATE 800 MG PO TABS
800.0000 mg | ORAL_TABLET | Freq: Three times a day (TID) | ORAL | Status: DC
  Administered 2017-04-03 – 2017-04-09 (×13): 800 mg via ORAL
  Filled 2017-04-02 (×14): qty 1

## 2017-04-02 MED ORDER — HEPARIN (PORCINE) IN NACL 100-0.45 UNIT/ML-% IJ SOLN
1650.0000 [IU]/h | INTRAMUSCULAR | Status: DC
  Administered 2017-04-02: 1100 [IU]/h via INTRAVENOUS
  Administered 2017-04-04: 1350 [IU]/h via INTRAVENOUS
  Administered 2017-04-04: 1450 [IU]/h via INTRAVENOUS
  Administered 2017-04-06 (×2): 1650 [IU]/h via INTRAVENOUS
  Filled 2017-04-02 (×7): qty 250

## 2017-04-02 MED ORDER — ASPIRIN 300 MG RE SUPP: 300.0000 mg | RECTAL | Status: AC

## 2017-04-02 NOTE — ED Notes (Signed)
Provider at bedside

## 2017-04-02 NOTE — ED Triage Notes (Signed)
Pt from home after dialysis and started having chest pain. When EMS arrived pt had a blood pressure of 80 After 537mL of fluid pt BP is 100/50. CBG of 220. Pt A&Ox4

## 2017-04-02 NOTE — ED Notes (Signed)
Pt's dinner tray arrived. 

## 2017-04-02 NOTE — ED Provider Notes (Signed)
[facility] EMERGENCY DEPARTMENT Provider Note   CSN: [id number] Arrival date & time: 04/02/17  1327     History   Chief Complaint Chief Complaint  Patient presents with  . Chest Pain    HPI   Blood pressure (!) 102/43, pulse 76, temperature 97.7 F (36.5 C), temperature source Oral, resp. rate (!) 21, height 5\' 6"  (1.676 m), weight 123 kg (271 lb 2.7 oz), SpO2 96 %.  [name] is a 72 y.o. female past medical history significant for CHF, ESRD on dialysis, fully dialyzed this morning complaining of left-sided chest pain described as pressure-like, 5 out of 10 radiating down to the left hand onset at approximately 11:30 AM.  She took full dose aspirin as advised by EMS.  Mild associated dyspnea with no cough, fever, chills, orthopnea, PND.  Past Medical History:  Diagnosis Date  . Abdominal abscess 12-17-10   abdominal abscesses x2 ? one at this time  . Anemia   . Arthritis    "all over" (08/23/2012)  . Blind right eye    fell and crushed socket and eye fell out, was replaced at [facility]  . CHF (congestive heart failure) ([place])   . Chronic lower back pain   . Coronary artery disease    normal coronaries by 08/25/12 cath  . Dyspnea   . Dysrhythmia    irregular, "skips beats"  . ESRD (end stage renal disease) on dialysis ([place])    "started 12/2011; [place]; TTS" (08/23/2012  . Exertional shortness of breath   . Eye drainage    "lots; since fall 07/2011" (08/23/2012)  . Gout   . H/O hiatal hernia   . Headache(784.0)    "weekly sometimes; since I fell and hit my head in 07/2011 08/23/2012)  . Heart murmur   . History of blood transfusion    "lots before starting dialysis" (08/23/2012)  . Hypertension    sees Dr. Willey Blade  . Inhalation injury    "worked at [employer]; can't inhale polyurethane or paint, etc" (08/23/2012)  . Myocardial infarction ([place]) 1970's or 1980's  . Neuromuscular disorder ([place])    diabetic neuropathy  . OSA on CPAP   .  Staph aureus infection November 2012   . Swelling of both ankles 12-17-10  . Type II diabetes mellitus ([place])    "over 10 years now" (08/23/2012)    Patient Active Problem List   Diagnosis Date Noted  . Hypoxemia 10/13/2016  . Acute on chronic combined systolic and diastolic CHF (congestive heart failure) ([place]) 10/13/2016  . Anemia due to chronic kidney disease 10/13/2016  . Acute left systolic heart failure ([place]) 01/30/2016  . Encephalopathy, metabolic   . Tremor   . Weakness   . Acute hyperkalemia 12/04/2014  . Neurological deficit present 12/04/2014  . Hypercalcemia 12/04/2014  . OSA on CPAP   . Myocardial infarction ([place])   . Blind right eye   . Coronary artery disease   . CHF (congestive heart failure) ([place])   . ESRD needing dialysis ()   . Hyperkalemia 08/05/2014  . Diabetes mellitus with complication ([place]) 03/50/0938  . Arm pain 02/27/2012  . Other complications due to renal dialysis device, implant, and graft 02/13/2012  . Pain in limb 11/21/2011  . Swollen limb 11/21/2011  . Itching 11/21/2011  . End stage renal disease ([place]) 11/07/2011  . Chronic kidney disease, stage IV (severe) ([place]) 10/24/2011  . Chronic kidney disease (CKD), stage IV (severe) ([place]) 08/29/2011  Past Surgical History:  Procedure Laterality Date  . APPENDECTOMY    . AV FISTULA PLACEMENT  09/30/2011   Procedure: ARTERIOVENOUS (AV) FISTULA CREATION;  Surgeon: Conrad McComb, MD;  Location: [facility];  Service: Vascular;  Laterality: Left;  BRACHIAL-CEPHALIC  . BACK SURGERY    . BASCILIC VEIN TRANSPOSITION Left 11/10/2011   Left arm   . [place] TRANSPOSITION  01/28/2012   Procedure: BASCILIC VEIN TRANSPOSITION;  Surgeon: Conrad Sterling, MD;  Location: [facility];  Service: Vascular;  Laterality: Left;  Second Stage   . CARDIAC CATHETERIZATION  1980's  . CATARACT EXTRACTION W/ INTRAOCULAR LENS IMPLANT Bilateral 1990's  . DILATION AND CURETTAGE OF UTERUS  1960's  . EYE SURGERY    . FISTULA  SUPERFICIALIZATION Left 02/18/2017   Procedure: SUPERFICIALIZATION LEFT ARM ARTERIOVENOUS FISTULA;  Surgeon: Serafina Mitchell, MD;  Location: [facility];  Service: Vascular;  Laterality: Left;  . HERNIA REPAIR    . INSERTION OF DIALYSIS CATHETER  01/05/2012   Procedure: INSERTION OF DIALYSIS CATHETER;  Surgeon: Conrad Carmichaels, MD;  Location: [facility];  Service: Vascular;  Laterality: N/A;  Right Internal Jugular Placement  . INTRAOCULAR PROSTHESES INSERTION Right 2013   "fell; knocked my eye outl" (08/23/2012)  . JOINT REPLACEMENT    . LAPAROTOMY  12/18/2010   Procedure: EXPLORATORY LAPAROTOMY;  Surgeon: Rolm Bookbinder, MD;  Location: [facility];  Service: General;  Laterality: N/A;  Abdominal Seroma Evacuation  . LEFT HEART CATHETERIZATION WITH CORONARY ANGIOGRAM N/A 08/25/2012   Procedure: LEFT HEART CATHETERIZATION WITH CORONARY ANGIOGRAM;  Surgeon: Birdie Riddle, MD;  Location: [facility] C[facility];  Service: Cardiovascular;  Laterality: N/A;  . LUMBAR DISC SURGERY  1970'-80's   X 3  . TOTAL KNEE ARTHROPLASTY Left 1980's  . VAGINAL HYSTERECTOMY  1970's  . VENTRAL HERNIA REPAIR  2012-2013   component separation, repair with biologic; "had 3 surgeries to fix it" (08/23/2012)  . WOUND DEBRIDEMENT  03/20/2011   Procedure: DEBRIDEMENT ABDOMINAL WOUND;  Surgeon: Rolm Bookbinder, MD;  Location: [facility];  Service: General;  Laterality: N/A;  debridement abdominal wall, placement of wound vac    OB History    No data available       Home Medications    Prior to Admission medications   Medication Sig Start Date End Date Taking? Authorizing Provider  albuterol (PROVENTIL HFA;VENTOLIN HFA) 108 (90 BASE) MCG/ACT inhaler Inhale 2 puffs into the lungs every 4 (four) hours as needed for wheezing or shortness of breath.    [provider]  albuterol (PROVENTIL) (2.5 MG/3ML) 0.083% nebulizer solution Take 3 mLs (2.5 mg total) by nebulization every 2 (two) hours as needed for shortness of breath. 10/21/16   Elwin Mocha, MD  aspirin EC 81 MG tablet Take 81 mg by mouth daily.      [provider]  atorvastatin (LIPITOR) 40 MG tablet Take 40 mg by mouth at bedtime.     Dixie Dials, MD  carvedilol (COREG) 25 MG tablet Take 25 mg by mouth 2 (two) times daily.  09/26/16   [provider]  cinacalcet (SENSIPAR) 90 MG tablet Take 1 tablet (90 mg) by mouth daily. On dialysis days, take the dose after dialysis    [provider]  diphenhydrAMINE (BENADRYL) 25 mg capsule Take 50 mg by mouth See admin instructions. Take 2 tablets (50 mg) by mouth prior to dialysis on Tuesday, Thursday, Saturday - for itching    [provider]  ethyl chloride  spray Apply 1 application topically See admin instructions. Apply topically before dialysis on Tuesday, Thursday, Saturday 01/10/16   [provider]  HYDROcodone-acetaminophen (NORCO) 5-325 MG tablet Take 1 tablet by mouth every 6 (six) hours as needed for moderate pain. 02/18/17   Dagoberto Ligas, PA-C  insulin glargine (LANTUS) 100 UNIT/ML injection Inject 0.1 mLs (10 Units total) into the skin at bedtime. Patient taking differently: Inject 5 Units into the skin daily as needed (for low BS).  10/21/16   Elwin Mocha, MD  midodrine (PROAMATINE) 10 MG tablet Take 10 mg by mouth See admin instructions. Take 10 mg by mouth twice daily at 4AM and 9AM. On dialysis days, take first dose before dialysis and second dose mid-way through dialysis. 09/26/16   [provider]  Multiple Vitamin (MULITIVITAMIN WITH MINERALS) TABS Take 1 tablet by mouth daily.    [provider]  oxyCODONE-acetaminophen (PERCOCET/ROXICET) 5-325 MG tablet Take 1-2 tablets by mouth every 8 (eight) hours as needed for severe pain.  01/16/17   [provider]  sevelamer carbonate (RENVELA) 800 MG tablet Take 1 tablet (800 mg total) by mouth 3 (three) times daily with meals. Patient taking differently: Take 1,600-2,400 mg by mouth See admin  instructions. Take 3 tablets (2,400 mg) by mouth 3 times daily with meals. Take 2 tablets (1,600 mg) by mouth with snacks. Receives supply from dialysis center. 02/12/16   Dixie Dials, MD  tiotropium (SPIRIVA) 18 MCG inhalation capsule Place 1 capsule (18 mcg total) into inhaler and inhale daily. 10/21/16   Elwin Mocha, MD    Family History Family History  Problem Relation Age of Onset  . Hypertension Mother   . Cancer Brother        spine  . Hypertension Father   . Cancer Sister        brain  . Hypertension Maternal Grandmother   . Anesthesia problems Neg Hx     Social History Social History   Tobacco Use  . Smoking status: Former Smoker    Packs/day: 0.25    Years: 16.00    Pack years: 4.00    Types: Cigarettes    Last attempt to quit: 12/16/1980    Years since quitting: 36.3  . Smokeless tobacco: Never Used  Substance Use Topics  . Alcohol use: No  . Drug use: No     Allergies   Lyrica [pregabalin]   Review of Systems Review of Systems  A complete review of systems was obtained and all systems are negative except as noted in the HPI and PMH.   Physical Exam Updated Vital Signs BP (!) 102/59   Pulse 68   Temp 97.7 F (36.5 C) (Oral)   Resp 17   Ht 5\' 6"  (1.676 m)   Wt 123 kg (271 lb 2.7 oz)   SpO2 96%   BMI 43.77 kg/m   Physical Exam  Constitutional: She is oriented to person, place, and time. She appears well-developed and well-nourished. No distress.  Obese  HENT:  Head: Normocephalic.  Mouth/Throat: Oropharynx is clear and moist.  Eyes: Conjunctivae are normal.  Neck: Normal range of motion. No JVD present. No tracheal deviation present.  Cardiovascular: Normal rate and intact distal pulses.  Radial pulse equal bilaterally, irregular  Pulmonary/Chest: Effort normal and breath sounds normal. No stridor. No respiratory distress. She has no wheezes. She has no rales. She exhibits no tenderness.  Tunneled catheter to left chest  Abdominal:  Soft. She exhibits no distension and no  mass. There is no tenderness. There is no rebound and no guarding.  Musculoskeletal: Normal range of motion. She exhibits no edema or tenderness.  No calf asymmetry, superficial collaterals, palpable cords, edema, Homans sign negative bilaterally.    Neurological: She is alert and oriented to person, place, and time.  Skin: Skin is warm. She is not diaphoretic.  Psychiatric: She has a normal mood and affect.  Nursing note and vitals reviewed.    ED Treatments / Results  Labs (all labs ordered are listed, but only abnormal results are displayed) Labs Reviewed  BASIC METABOLIC PANEL - Abnormal; Notable for the following components:      Result Value   Chloride 97 (*)    Glucose, Bld 118 (*)    BUN 24 (*)    Creatinine, Ser 5.14 (*)    Calcium 8.0 (*)    GFR calc non Af Amer 8 (*)    GFR calc Af Amer 9 (*)    All other components within normal limits  CBC - Abnormal; Notable for the following components:   Hemoglobin 10.5 (*)    HCT 34.0 (*)    RDW 21.1 (*)    All other components within normal limits  MAGNESIUM  HEPARIN LEVEL (UNFRACTIONATED)  I-STAT TROPONIN, ED    EKG  EKG Interpretation  Date/Time:  Thursday April 02 2017 13:35:09 EDT Ventricular Rate:  71 PR Interval:    QRS Duration: 99 QT Interval:  483 QTC Calculation: 525 R Axis:   -41 Text Interpretation:  Sinus rhythm Multiform ventricular premature complexes Left axis deviation Prolonged QT interval Confirmed by Virgel Manifold [phone]) on 04/02/2017 2:16:34 PM       Radiology Dg Chest 2 View  Result Date: 04/02/2017 CLINICAL DATA:  Chest pain following dialysis EXAM: CHEST - 2 VIEW COMPARISON:  Chest radiograph October 16, 2016 and chest CT October 16, 2016 FINDINGS: Central catheter tip is in the right atrium slightly distal to the cavoatrial junction. No pneumothorax. There is no appreciable edema or consolidation. Heart is upper normal in size with pulmonary  vascularity within normal limits. No adenopathy. There is aortic atherosclerosis. No bone lesions. IMPRESSION: Central catheter tip in right atrium. No pneumothorax. No edema or consolidation. Heart upper normal in size. There is aortic atherosclerosis. Aortic Atherosclerosis (ICD10-I70.0). Electronically Signed   By: Lowella Grip III M.D.   On: 04/02/2017 14:25    Procedures Procedures (including critical care time)   Medications Ordered in ED Medications  morphine 4 MG/ML injection 4 mg (not administered)  heparin bolus via infusion 4,000 Units (not administered)  heparin ADULT infusion 100 units/mL (25000 units/257mL sodium chloride 0.45%) (not administered)     Initial Impression / Assessment and Plan / ED Course  I have reviewed the triage vital signs and the nursing notes.  Pertinent labs & imaging results that were available during my care of the patient were reviewed by me and considered in my medical decision making (see chart for details).     Vitals:   04/02/17 1340 04/02/17 1345 04/02/17 1400 04/02/17 1415  BP: (!) 102/43 (!) 103/56 (!) 100/53 (!) 102/59  Pulse: 76 76 (!) 56 68  Resp: (!) 21 (!) 21 18 17   Temp: 97.7 F (36.5 C)     TempSrc: Oral     SpO2: 96% 96% 94% 96%  Weight:      Height:        Medications  morphine 4 MG/ML injection 4 mg (not administered)  heparin bolus  via infusion 4,000 Units (not administered)  heparin ADULT infusion 100 units/mL (25000 units/253mL sodium chloride 0.45%) (not administered)    [name] is 72 y.o. female presenting with chest pain occurring several hours ago with associated shortness of breath, this occurred just after dialysis.  Blood pressure soft, no gross ischemia on EKG.  Morphine given for pain control.  This is a patient of Dr. Merrilee Jansky.  I have discussed with Dr. Terrence Dupont who will be in to see the patient in several hours.  He asked that we initiate heparin.  Final Clinical Impressions(s) / ED  Diagnoses   Final diagnoses:  Chest pain, unspecified type    ED Discharge Orders    None       Karen Kays Charna Elizabeth 04/02/17 1522    Virgel Manifold, MD 04/02/17 [phone]

## 2017-04-02 NOTE — Progress Notes (Signed)
ANTICOAGULATION CONSULT NOTE - Initial Consult  Pharmacy Consult for Heparin Indication: chest pain/ACS  Allergies  Allergen Reactions  . Lyrica [Pregabalin] Other (See Comments)    Hallucinations    Patient Measurements: Height: 5\' 6"  (167.6 cm) Weight: 271 lb 2.7 oz (123 kg) IBW/kg (Calculated) : 59.3 Heparin Dosing Weight: 89 kg  Vital Signs: Temp: 97.7 F (36.5 C) (03/14 1340) Temp Source: Oral (03/14 1340) BP: 100/53 (03/14 1400) Pulse Rate: 56 (03/14 1400)  Labs: Recent Labs    04/02/17 1339  HGB Tiffany.5*  HCT 34.0*  PLT 228  CREATININE 5.14*    Estimated Creatinine Clearance: 13 mL/min (A) (by C-G formula based on SCr of 5.14 mg/dL (H)).   Medical History: Past Medical History:  Diagnosis Date  . Abdominal abscess 12-17-10   abdominal abscesses x2 ? one at this time  . Anemia   . Arthritis    "all over" (08/23/2012)  . Blind right eye    fell and crushed socket and eye fell out, was [facility]  . CHF (congestive heart failure) ([place])   . Chronic lower back pain   . Coronary artery disease    normal coronaries by 08/25/12 cath  . Dyspnea   . Dysrhythmia    irregular, "skips beats"  . ESRD (end stage renal disease) on dialysis ([place])    "started 12/2011; [place]; TTS" (08/23/2012  . Exertional shortness of breath   . Eye drainage    "lots; since fall 07/2011" (08/23/2012)  . Gout   . H/O hiatal hernia   . Headache(784.0)    "weekly sometimes; since I fell and hit my head in 07/2011 08/23/2012)  . Heart murmur   . History of blood transfusion    "lots before starting dialysis" (08/23/2012)  . Hypertension    sees Dr. Willey Blade  . Inhalation injury    "worked at [employer]; can't inhale polyurethane or paint, etc" (08/23/2012)  . Myocardial infarction ([place]) 1970's or 1980's  . Neuromuscular disorder ([place])    diabetic neuropathy  . OSA on CPAP   . Staph aureus infection November 2012   . Swelling of both ankles 12-17-10  . Type II  diabetes mellitus ([place])    "over Tiffany years now" (08/23/2012)    Assessment: [name] with ESRD who [facility] the [facility] on 3[facility]14 with CP. Pharmacy consulted to start Heparin for ACS/CP.   The last surgery noted was a fistula superficialization in Jan '19. ESRD with noted anemia - Hgb Tiffany.5, plts wnl. No anticoagulation noted PTA.   Goal of Therapy:  Heparin level 0.3-0.7 units/ml Monitor platelets by anticoagulation protocol: Yes   Plan:  - Heparin 4000 unit bolus x 1 - Start Heparin at 1100 units/hr (11 ml/hr) - Will continue to monitor for any signs/symptoms of bleeding and will follow up with heparin level in 8 hours   Thank you for allowing pharmacy to be a part of this patient's care.  Alycia Rossetti, PharmD, BCPS Clinical Pharmacist Pager: [phone] Clinical phone for 04/02/2017  [phone] 04/02/2017 3:07 PM

## 2017-04-02 NOTE — ED Notes (Signed)
Ordered pt Heart Healthy/Carb Modified dinner tray, per Gabe - RN.

## 2017-04-02 NOTE — Care Management Obs Status (Signed)
[facility] NOTIFICATION   Patient Details  Name: LORRE OPDAHL MRN: [id number] Date of Birth: 09-Sep-1945   Medicare Observation Status Notification Given:  Yes    Apolonio Schneiders, RN 04/02/2017, 5:46 PM

## 2017-04-02 NOTE — Care Management Note (Signed)
Case Management Note  Patient Details  Name: [name] MRN: [id number] Date of Birth: Jun 11, 1945  Subjective/Objective:                  Chest Pain, Shortness of Breath  Action/Plan: [facility] spoke with the patient at the bedside. One of her daughters and 2 children are present. Patient reports she lives alone. She has a walker, cane and wheelchair at home. She reports using the walker and cane. She receives transportation assistance to physician appointments thru Social Services. She has a home health attendant but reports no skilled services. Patient is being admitted. Unit CM to follow for discharge needs.    Expected Discharge Date:    unknown            Expected Discharge Plan:  Home/Self Care  In-House Referral:     Discharge planning Services  CM Consult  Post Acute Care Choice:    Choice offered to:     DME Arranged:    DME Agency:     HH Arranged:    HH Agency:     Status of Service:  In process, will continue to follow  If discussed at Long Length of Stay Meetings, dates discussed:    Additional Comments:  Apolonio Schneiders, RN 04/02/2017, 5:56 PM

## 2017-04-02 NOTE — H&P (Signed)
[name] is an 72 y.o. female.   Chief Complaint: Retrosternal and left-sided chest pain associated with shortness of breath HPI: Patient is 72 year old female with past medical history significant for coronary artery disease history of remote PCI in 1980s, hypertension, diabetes mellitus, end-stage renal disease on hemodialysis, obstructive sleep apnea, morbid obesity, anemia of chronic disease, history of right is hernia, right eye blindness, history of congestive heart failure secondary to preserved LV systolic function, history of questionable no infarct in the past, came to the ER by EMS because of retrosternal and left-sided chest pain described as pressure some as if someone is sitting on the chest grade 6/10 after dialysis and came home. Patient was noted to be hypotensive received  500 mL bolus of fluid with improvement in her blood pressure and improvement in her chest pain patient was started on IV heparin was further improvement in chest pain EKG showed no acute ischemic changes IV nitroglycerin was not started due to marginal blood pressure. Patient had nuclear stress test approximately 6 months ago which showed no evidence of ischemia.  Past Medical History:  Diagnosis Date  . Abdominal abscess 12-17-10   abdominal abscesses x2 ? one at this time  . Anemia   . Arthritis    "all over" (08/23/2012)  . Blind right eye    fell and crushed socket and eye fell out, was replaced at [facility]  . CHF (congestive heart failure) ([place])   . Chronic lower back pain   . Coronary artery disease    normal coronaries by 08/25/12 cath  . Dyspnea   . Dysrhythmia    irregular, "skips beats"  . ESRD (end stage renal disease) on dialysis ([place])    "started 12/2011; [place]; TTS" (08/23/2012  . Exertional shortness of breath   . Eye drainage    "lots; since fall 07/2011" (08/23/2012)  . Gout   . H/O hiatal hernia   . Headache(784.0)    "weekly sometimes; since I fell and hit my head in 07/2011  08/23/2012)  . Heart murmur   . History of blood transfusion    "lots before starting dialysis" (08/23/2012)  . Hypertension    sees Dr. Willey Blade  . Inhalation injury    "worked at [employer]; can't inhale polyurethane or paint, etc" (08/23/2012)  . Myocardial infarction ([place]) 1970's or 1980's  . Neuromuscular disorder ([place])    diabetic neuropathy  . OSA on CPAP   . Staph aureus infection November 2012   . Swelling of both ankles 12-17-10  . Type II diabetes mellitus ([place])    "over 10 years now" (08/23/2012)    Past Surgical History:  Procedure Laterality Date  . APPENDECTOMY    . AV FISTULA PLACEMENT  09/30/2011   Procedure: ARTERIOVENOUS (AV) FISTULA CREATION;  Surgeon: Conrad Trumansburg, MD;  Location: [place];  Service: Vascular;  Laterality: Left;  BRACHIAL-CEPHALIC  . BACK SURGERY    . BASCILIC VEIN TRANSPOSITION Left 11/10/2011   Left arm   . [place] TRANSPOSITION  01/28/2012   Procedure: BASCILIC VEIN TRANSPOSITION;  Surgeon: Conrad Jasper, MD;  Location: [place];  Service: Vascular;  Laterality: Left;  Second Stage   . CARDIAC CATHETERIZATION  1980's  . CATARACT EXTRACTION W/ INTRAOCULAR LENS IMPLANT Bilateral 1990's  . DILATION AND CURETTAGE OF UTERUS  1960's  . EYE SURGERY    . FISTULA SUPERFICIALIZATION Left 02/18/2017   Procedure: SUPERFICIALIZATION LEFT ARM ARTERIOVENOUS FISTULA;  Surgeon: Serafina Mitchell, MD;  Location: [facility];  Service: Vascular;  Laterality: Left;  . HERNIA REPAIR    . INSERTION OF DIALYSIS CATHETER  01/05/2012   Procedure: INSERTION OF DIALYSIS CATHETER;  Surgeon: Conrad Earth, MD;  Location: [place];  Service: Vascular;  Laterality: N/A;  Right Internal Jugular Placement  . INTRAOCULAR PROSTHESES INSERTION Right 2013   "fell; knocked my eye outl" (08/23/2012)  . JOINT REPLACEMENT    . LAPAROTOMY  12/18/2010   Procedure: EXPLORATORY LAPAROTOMY;  Surgeon: Rolm Bookbinder, MD;  Location: [facility];  Service: General;  Laterality: N/A;  Abdominal Seroma  Evacuation  . LEFT HEART CATHETERIZATION WITH CORONARY ANGIOGRAM N/A 08/25/2012   Procedure: LEFT HEART CATHETERIZATION WITH CORONARY ANGIOGRAM;  Surgeon: Birdie Riddle, MD;  Location: [place] CATH [facility];  Service: Cardiovascular;  Laterality: N/A;  . LUMBAR DISC SURGERY  1970'-80's   X 3  . TOTAL KNEE ARTHROPLASTY Left 1980's  . VAGINAL HYSTERECTOMY  1970's  . VENTRAL HERNIA REPAIR  2012-2013   component separation, repair with biologic; "had 3 surgeries to fix it" (08/23/2012)  . WOUND DEBRIDEMENT  03/20/2011   Procedure: DEBRIDEMENT ABDOMINAL WOUND;  Surgeon: Rolm Bookbinder, MD;  Location: [place];  Service: General;  Laterality: N/A;  debridement abdominal wall, placement of wound vac    Family History  Problem Relation Age of Onset  . Hypertension Mother   . Cancer Brother        spine  . Hypertension Father   . Cancer Sister        brain  . Hypertension Maternal Grandmother   . Anesthesia problems Neg Hx    Social History:  reports that she quit smoking about 36 years ago. Her smoking use included cigarettes. She has a 4.00 pack-year smoking history. she has never used smokeless tobacco. She reports that she does not drink alcohol or use drugs.  Allergies:  Allergies  Allergen Reactions  . Lyrica [Pregabalin] Other (See Comments)    Hallucinations     (Not in a hospital admission)  Results for orders placed or performed during the hospital encounter of 04/02/17 (from the past 48 hour(s))  Basic metabolic panel     Status: Abnormal   Collection Time: 04/02/17  1:39 PM  Result Value Ref Range   Sodium 136 135 - 145 mmol/L   Potassium 4.7 3.5 - 5.1 mmol/L   Chloride 97 (L) 101 - 111 mmol/L   CO2 25 22 - 32 mmol/L   Glucose, Bld 118 (H) 65 - 99 mg/dL   BUN 24 (H) 6 - 20 mg/dL   Creatinine, Ser 5.14 (H) 0.44 - 1.00 mg/dL   Calcium 8.0 (L) 8.9 - 10.3 mg/dL   GFR calc non Af Amer 8 (L) >60 mL/min   GFR calc Af Amer 9 (L) >60 mL/min    Comment: (NOTE) The eGFR has been  calculated using the CKD EPI equation. This calculation has not been validated in all clinical situations. eGFR's persistently <60 mL/min signify possible Chronic Kidney Disease.    Anion gap 14 5 - 15    Comment: Performed at [facility] [address] [place] [address]  CBC     Status: Abnormal   Collection Time: 04/02/17  1:39 PM  Result Value Ref Range   WBC 8.0 4.0 - 10.5 K/uL   RBC 3.94 3.87 - 5.11 MIL/uL   Hemoglobin 10.5 (L) 12.0 - 15.0 g/dL   HCT 34.0 (L) 36.0 - 46.0 %   MCV 86.3 78.0 -  100.0 fL   MCH 26.6 26.0 - 34.0 pg   MCHC 30.9 30.0 - 36.0 g/dL   RDW 21.1 (H) 11.5 - 15.5 %   Platelets 228 150 - 400 K/uL    Comment: Performed at [facility] [address], [place], [place] [address]  Magnesium     Status: None   Collection Time: 04/02/17  1:39 PM  Result Value Ref Range   Magnesium 2.0 1.7 - 2.4 mg/dL    Comment: Performed at [facility], [place] [address], [place], [place] [address]  I-stat troponin, ED     Status: None   Collection Time: 04/02/17  1:44 PM  Result Value Ref Range   Troponin i, poc 0.00 0.00 - 0.08 ng/mL   Comment 3            Comment: Due to the release kinetics of cTnI, a negative result within the first hours of the onset of symptoms does not rule out myocardial infarction with certainty. If myocardial infarction is still suspected, repeat the test at appropriate intervals.    Dg Chest 2 View  Result Date: 04/02/2017 CLINICAL DATA:  Chest pain following dialysis EXAM: CHEST - 2 VIEW COMPARISON:  Chest radiograph October 16, 2016 and chest CT October 16, 2016 FINDINGS: Central catheter tip is in the right atrium slightly distal to the cavoatrial junction. No pneumothorax. There is no appreciable edema or consolidation. Heart is upper normal in size with pulmonary vascularity within normal limits. No adenopathy. There is aortic atherosclerosis. No bone lesions. IMPRESSION: Central catheter tip in right atrium. No  pneumothorax. No edema or consolidation. Heart upper normal in size. There is aortic atherosclerosis. Aortic Atherosclerosis (ICD10-I70.0). Electronically Signed   By: Lowella Grip III M.D.   On: 04/02/2017 14:25    Review of Systems  Constitutional: Negative for chills, diaphoresis and fever.  HENT: Negative for hearing loss.   Eyes: Negative for blurred vision.  Respiratory: Positive for shortness of breath. Negative for cough.   Cardiovascular: Positive for chest pain. Negative for palpitations and leg swelling.  Gastrointestinal: Negative for abdominal pain.  Neurological: Positive for dizziness.    Blood pressure 109/66, pulse 71, temperature 97.7 F (36.5 C), temperature source Oral, resp. rate 17, height _0  (1.676 m), weight 123 kg (271 lb 2.7 oz), SpO2 98 %. Physical Exam  Constitutional: She is oriented to person, place, and time.  HENT:  Head: Normocephalic and atraumatic.  Eyes: Conjunctivae are normal. Left eye exhibits no discharge. No scleral icterus.  Neck: Normal range of motion. Neck supple. No JVD present. No thyromegaly present.  Cardiovascular: Normal rate and regular rhythm.  Murmur (2/6 systolic murmur noted no S3 gallop no pericardial rub) heard. Respiratory: Effort normal and breath sounds normal. No respiratory distress. She has no wheezes. She has no rales.  GI: Soft. Bowel sounds are normal. She exhibits distension. There is no tenderness. There is no rebound.  Musculoskeletal: She exhibits no edema, tenderness or deformity.  Neurological: She is alert and oriented to person, place, and time.     Assessment/Plan Unstable angina rule out MI Coronary artery disease history of PCI in remote past Decompensated congestive heart failure secondary to preserved LV systolic function Hypertension Diabetes mellitus COPD Obstructive sleep apnea Morbid obesity End-stage renal disease on hemodialysis Height is hernia Anemia of chronic disease Blindness  right eye History of questionable lacunar infarct in the past. Plan As per orders Schedule for [facility], MD 04/02/2017,  4:21 PM

## 2017-04-02 NOTE — ED Notes (Signed)
Patient transported to X-ray 

## 2017-04-03 ENCOUNTER — Observation Stay (HOSPITAL_COMMUNITY): Payer: Medicare HMO

## 2017-04-03 DIAGNOSIS — E782 Mixed hyperlipidemia: Secondary | ICD-10-CM | POA: Diagnosis not present

## 2017-04-03 LAB — CBC
HCT: 32.6 % — ABNORMAL LOW (ref 36.0–46.0)
HEMOGLOBIN: 10.1 g/dL — AB (ref 12.0–15.0)
MCH: 26.9 pg (ref 26.0–34.0)
MCHC: 31 g/dL (ref 30.0–36.0)
MCV: 86.7 fL (ref 78.0–100.0)
PLATELETS: 226 10*3/uL (ref 150–400)
RBC: 3.76 MIL/uL — ABNORMAL LOW (ref 3.87–5.11)
RDW: 21 % — ABNORMAL HIGH (ref 11.5–15.5)
WBC: 7 10*3/uL (ref 4.0–10.5)

## 2017-04-03 LAB — BASIC METABOLIC PANEL
Anion gap: 15 (ref 5–15)
BUN: 38 mg/dL — AB (ref 6–20)
CHLORIDE: 97 mmol/L — AB (ref 101–111)
CO2: 24 mmol/L (ref 22–32)
CREATININE: 7.35 mg/dL — AB (ref 0.44–1.00)
Calcium: 8.9 mg/dL (ref 8.9–10.3)
GFR calc Af Amer: 6 mL/min — ABNORMAL LOW (ref >60)
GFR calc non Af Amer: 5 mL/min — ABNORMAL LOW (ref >60)
GLUCOSE: 89 mg/dL (ref 65–99)
POTASSIUM: 5 mmol/L (ref 3.5–5.1)
SODIUM: 136 mmol/L (ref 135–145)

## 2017-04-03 LAB — LIPID PANEL
CHOL/HDL RATIO: 1.9 ratio
CHOLESTEROL: 79 mg/dL (ref 0–200)
HDL: 42 mg/dL (ref >40)
LDL CALC: 27 mg/dL (ref 0–99)
Triglycerides: 50 mg/dL (ref <150)
VLDL: 10 mg/dL (ref 0–40)

## 2017-04-03 LAB — HEPARIN LEVEL (UNFRACTIONATED)
HEPARIN UNFRACTIONATED: 0.22 [IU]/mL — AB (ref 0.30–0.70)
HEPARIN UNFRACTIONATED: 0.35 [IU]/mL (ref 0.30–0.70)
Heparin Unfractionated: >2.2 IU/mL — ABNORMAL HIGH (ref 0.30–0.70)
Heparin Unfractionated: 0.3 IU/mL (ref 0.30–0.70)

## 2017-04-03 LAB — GLUCOSE, CAPILLARY
GLUCOSE-CAPILLARY: 172 mg/dL — AB (ref 65–99)
GLUCOSE-CAPILLARY: 105 mg/dL — AB (ref 65–99)
Glucose-Capillary: 93 mg/dL (ref 65–99)
Glucose-Capillary: 92 mg/dL (ref 65–99)

## 2017-04-03 LAB — MRSA PCR SCREENING: MRSA BY PCR: NEGATIVE

## 2017-04-03 LAB — TROPONIN I
Troponin I: <0.03 ng/mL (ref <0.03)
Troponin I: <0.03 ng/mL (ref <0.03)

## 2017-04-03 MED ORDER — REGADENOSON 0.4 MG/5ML IV SOLN
INTRAVENOUS | Status: AC
  Filled 2017-04-03: qty 5

## 2017-04-03 MED ORDER — ALBUTEROL SULFATE (2.5 MG/3ML) 0.083% IN NEBU
2.5000 mg | INHALATION_SOLUTION | Freq: Four times a day (QID) | RESPIRATORY_TRACT | Status: AC
  Administered 2017-04-03 – 2017-04-04 (×4): 2.5 mg via RESPIRATORY_TRACT
  Filled 2017-04-03 (×4): qty 3

## 2017-04-03 MED ORDER — TECHNETIUM TC 99M TETROFOSMIN IV KIT
30.0000 | PACK | Freq: Once | INTRAVENOUS | Status: AC | PRN
  Administered 2017-04-03: 30 via INTRAVENOUS

## 2017-04-03 MED ORDER — REGADENOSON 0.4 MG/5ML IV SOLN
0.4000 mg | Freq: Once | INTRAVENOUS | Status: AC
  Administered 2017-04-03: 0.4 mg via INTRAVENOUS
  Filled 2017-04-03: qty 5

## 2017-04-03 NOTE — Progress Notes (Signed)
[place] for Heparin Indication: chest pain/ACS  Allergies  Allergen Reactions  . Lyrica [Pregabalin] Other (See Comments)    Hallucinations    Patient Measurements: Height: 5\' 6"  (167.6 cm) Weight: 270 lb 11.2 oz (122.8 kg) IBW/kg (Calculated) : 59.3 Heparin Dosing Weight: 89 kg  Vital Signs: Temp: 97.8 F (36.6 C) (03/15 0025) Temp Source: Oral (03/15 0025) BP: 87/55 (03/15 0025) Pulse Rate: 62 (03/15 0025)  Labs: Recent Labs    04/02/17 1339 04/02/17 2114 04/02/17 2349  HGB 10.5*  --   --   HCT 34.0*  --   --   PLT 228  --   --   HEPARINUNFRC  --   --  0.22*  CREATININE 5.14*  --   --   TROPONINI  --  <0.03  --     Estimated Creatinine Clearance: 13 mL/min (A) (by C-G formula based on SCr of 5.14 mg/dL (H)).   Medical History: Past Medical History:  Diagnosis Date  . Abdominal abscess 12-17-10   abdominal abscesses x2 ? one at this time  . Anemia   . Arthritis    "all over" (08/23/2012)  . Blind right eye    fell and crushed socket and eye fell out, was replaced at [facility]  . CHF (congestive heart failure) ([place])   . Chronic lower back pain   . Coronary artery disease    normal coronaries by 08/25/12 cath  . Dyspnea   . Dysrhythmia    irregular, "skips beats"  . ESRD (end stage renal disease) on dialysis ([place])    "started 12/2011; [place]; TTS" (08/23/2012  . Exertional shortness of breath   . Eye drainage    "lots; since fall 07/2011" (08/23/2012)  . Gout   . H/O hiatal hernia   . Headache(784.0)    "weekly sometimes; since I fell and hit my head in 07/2011 08/23/2012)  . Heart murmur   . History of blood transfusion    "lots before starting dialysis" (08/23/2012)  . Hypertension    sees Dr. Willey Blade  . Inhalation injury    "worked at [employer]; can't inhale polyurethane or paint, etc" (08/23/2012)  . Myocardial infarction ([place]) 1970's or 1980's  . Neuromuscular disorder ([place])    diabetic neuropathy   . OSA on CPAP   . Staph aureus infection November 2012   . Swelling of both ankles 12-17-10  . Type II diabetes mellitus ([place])    "over 10 years now" (08/23/2012)    Assessment: 38 YOF with ESRD who presented to the [facility] on 3/14 with CP. Pharmacy consulted to start Heparin for ACS/CP.   The last surgery noted was a fistula superficialization in Jan '19. ESRD with noted anemia - Hgb 10.5, plts wnl. No anticoagulation noted PTA.   3/15 AM: initial heparin level is low at 0.22, no issues per RN.   Goal of Therapy:  Heparin level 0.3-0.7 units/ml Monitor platelets by anticoagulation protocol: Yes   Plan:  -Inc heparin to 1300 units/hr -0900 HL  Narda Bonds, PharmD, BCPS Clinical Pharmacist Phone: [phone]

## 2017-04-03 NOTE — Progress Notes (Signed)
Ref: Tiffany Hatchet, MD   Subjective:  Feeling short of breath. She was admitted with unstable angina.  Troponin-I is normal. Awaiting nuclear perfusion stress test.   Objective:  Vital Signs in the last 24 hours: Temp:  [97.7 F (36.5 C)-97.8 F (36.6 C)] 97.7 F (36.5 C) (03/15 0513) Pulse Rate:  [56-79] 69 (03/15 0513) Cardiac Rhythm: Heart block (03/15 0700) Resp:  [16-21] 18 (03/15 0513) BP: (87-133)/(43-83) 114/65 (03/15 0513) SpO2:  [92 %-100 %] 100 % (03/15 0513) Weight:  [122.8 kg (270 lb 11.2 oz)-123 kg (271 lb 2.7 oz)] 122.8 kg (270 lb 11.2 oz) (03/14 2220)  Physical Exam: BP Readings from Last 1 Encounters:  04/03/17 114/65     Wt Readings from Last 1 Encounters:  04/02/17 122.8 kg (270 lb 11.2 oz)    Weight change:  Body mass index is 43.69 kg/m. HEENT: [place]/AT, Eyes-Brown, PERL, EOMI, Conjunctiva-Pale pink, Sclera-Non-icteric Neck: No JVD, No bruit, Trachea midline. Lungs:  Basal crackles, Bilateral. Cardiac:  Regular rhythm, normal S1 and S2, no S3. II/VI systolic murmur. Abdomen:  Soft, non-tender. BS present. Extremities:  No edema present. No cyanosis. No clubbing. Left basilic vein fistula.  CNS: AxOx3, Cranial nerves grossly intact, moves all 4 extremities.  Skin: Warm and dry.   Intake/Output from previous day: 03/14 0701 - 03/15 0700 In: 917 [P.O.:240; I.V.:677] Out: -     Lab Results: BMET    Component Value Date/Time   NA 136 04/02/2017 1339   NA 142 02/18/2017 1115   NA 130 (L) 10/21/2016 0343   K 4.7 04/02/2017 1339   K 5.4 (H) 02/18/2017 1115   K 4.9 10/21/2016 0343   CL 97 (L) 04/02/2017 1339   CL 90 (L) 10/21/2016 0343   CL 93 (L) 10/19/2016 1850   CO2 25 04/02/2017 1339   CO2 21 (L) 10/21/2016 0343   CO2 27 10/19/2016 1850   GLUCOSE 118 (H) 04/02/2017 1339   GLUCOSE 100 (H) 02/18/2017 1115   GLUCOSE 531 (HH) 10/21/2016 0343   BUN 24 (H) 04/02/2017 1339   BUN 138 (H) 10/21/2016 0343   BUN 82 (H) 10/19/2016 1850   CREATININE  5.14 (H) 04/02/2017 1339   CREATININE 10.14 (H) 10/21/2016 0343   CREATININE 7.33 (H) 10/19/2016 1850   CREATININE 2.86 (H) 10/10/2010 1140   CREATININE 2.20 (H) 06/26/2010 1637   CALCIUM 8.0 (L) 04/02/2017 1339   CALCIUM 9.3 10/21/2016 0343   CALCIUM 10.1 10/19/2016 1850   CALCIUM 8.7 01/08/2010 1121   CALCIUM 9.1 10/08/2009 0922   CALCIUM 8.6 07/30/2009 1102   GFRNONAA 8 (L) 04/02/2017 1339   GFRNONAA 3 (L) 10/21/2016 0343   GFRNONAA 5 (L) 10/19/2016 1850   GFRAA 9 (L) 04/02/2017 1339   GFRAA 4 (L) 10/21/2016 0343   GFRAA 6 (L) 10/19/2016 1850   CBC    Component Value Date/Time   WBC 8.0 04/02/2017 1339   RBC 3.94 04/02/2017 1339   HGB 10.5 (L) 04/02/2017 1339   HCT 34.0 (L) 04/02/2017 1339   PLT 228 04/02/2017 1339   MCV 86.3 04/02/2017 1339   MCH 26.6 04/02/2017 1339   MCHC 30.9 04/02/2017 1339   RDW 21.1 (H) 04/02/2017 1339   LYMPHSABS 1.6 10/21/2016 0343   MONOABS 0.7 10/21/2016 0343   EOSABS 0.0 10/21/2016 0343   BASOSABS 0.0 10/21/2016 0343   HEPATIC Function Panel Recent Labs    10/14/16 0326  PROT 6.8   HEMOGLOBIN A1C No components found for: HGA1C,  MPG CARDIAC ENZYMES  Lab Results  Component Value Date   CKTOTAL 87 04/13/2010   CKMB 1.0 04/13/2010   TROPONINI <0.03 04/02/2017   TROPONINI <0.03 04/02/2017   TROPONINI 0.03 (HH) 01/31/2016   BNP No results for input(s): PROBNP in the last 8760 hours. TSH No results for input(s): TSH in the last 8760 hours. CHOLESTEROL No results for input(s): CHOL in the last 8760 hours.  Scheduled Meds: . albuterol  2.5 mg Nebulization Q6H  . aspirin  324 mg Oral NOW   Or  . aspirin  300 mg Rectal NOW  . aspirin EC  81 mg Oral Daily  . atorvastatin  40 mg Oral QHS  . insulin aspart  0-9 Units Subcutaneous TID WC  . metoprolol tartrate  12.5 mg Oral BID  . nitroGLYCERIN  0.5 inch Topical Q8H  . sevelamer carbonate  800 mg Oral TID WC  . tiotropium  18 mcg Inhalation Daily   Continuous Infusions: .  heparin 1,300 Units/hr (04/03/17 0032)   PRN Meds:.nitroGLYCERIN, ondansetron (ZOFRAN) IV  Assessment/Plan: Unstable angina Shortness of breath Hypertension CAD Type 2 DM COPD Morbid besity   Nuclear stress test today. Resume albuterol nebulizer treatments.   LOS: 0 days    Dixie Dials  MD  04/03/2017, 8:23 AM

## 2017-04-03 NOTE — Progress Notes (Signed)
ANTICOAGULATION CONSULT NOTE  Pharmacy Consult:  Heparin Indication: chest pain/ACS  Allergies  Allergen Reactions  . Lyrica [Pregabalin] Other (See Comments)    Hallucinations    Patient Measurements: Height: 5\' 6"  (167.6 cm) Weight: 270 lb 11.2 oz (122.8 kg) IBW/kg (Calculated) : 59.3 Heparin Dosing Weight: 89 kg  Vital Signs: Temp: 97.7 F (36.5 C) (03/15 1459) Temp Source: Oral (03/15 1459) BP: 121/56 (03/15 1459) Pulse Rate: 80 (03/15 1722)  Labs: Recent Labs    04/02/17 1339 04/02/17 2114  04/02/17 2349 04/03/17 0708 04/03/17 0904 04/03/17 1616 04/03/17 1924  HGB 10.5*  --   --   --  10.1*  --   --   --   HCT 34.0*  --   --   --  32.6*  --   --   --   PLT 228  --   --   --  226  --   --   --   HEPARINUNFRC  --   --    < > 0.22*  --  0.35 >2.20* 0.30  CREATININE 5.14*  --   --   --  7.35*  --   --   --   TROPONINI  --  <0.03  --  <0.03 <0.03  --   --   --    < > = values in this interval not displayed.    Estimated Creatinine Clearance: 9.1 mL/min (A) (by C-G formula based on SCr of 7.35 mg/dL (H)).     Assessment: 58 YOF with ESRD who presented to the [facility] on 3/14 with CP. Pharmacy consulted to start heparin for ACS/CP.   The last surgery noted was a fistula superficialization in Jan '19. ESRD with noted anemia. No anticoagulation noted PTA.   Confirmatory heparin level reported as >2.2.  Repeat confirmatory heparin level is therapeutic and at the low end of normal (0.3).  No bleeding reported.   Goal of Therapy:  Heparin level 0.3-0.7 units/ml Monitor platelets by anticoagulation protocol: Yes    Plan:  Increase heparin gtt to 1350 units/hr F/U AM labs    Anjana Cheek D. Mina Marble, PharmD, BCPS Pager:  [phone] 04/03/2017, 8:12 PM

## 2017-04-03 NOTE — Consult Note (Signed)
[facility] Renal Consultation Note    Indication for Consultation:  Management of ESRD/hemodialysis; anemia, hypertension/volume and secondary hyperparathyroidism   HPI: [name] is a 72 y.o. female with ESRD on HD (TTS [place]) PMH: DM2,HTN, CHF/CAD, gout,  blindness right eye (traumatic injury).  Presented to ED yesterday after her dialysis treatment with acute onset of chest pain and SOB. She had 5L removed at dialysis. Left sided chest pain started shortly after completing dialysis and EMS called.  EKG with no acute changes, troponin WNL. No edema or infiltrates on CXR. She was admitted for further observation and underwent nuclear stress study today.   Seen in room, she still endorses some SOB. CP has resolved, she is comfortable and wants something to eat.  She has been compliant with dialysis, completing full treatments and usually reaching her dry weight, but has frequent high IDWG. Dialyzing via [place], have just started to access AVF with one needle this week.     Past Medical History:  Diagnosis Date  . Abdominal abscess 12-17-10   abdominal abscesses x2 ? one at this time  . Anemia   . Arthritis    "all over" (08/23/2012)  . Blind right eye    fell and crushed socket and eye fell out, was replaced at [facility]  . CHF (congestive heart failure) ([place])   . Chronic lower back pain   . Coronary artery disease    normal coronaries by 08/25/12 cath  . Dyspnea   . Dysrhythmia    irregular, "skips beats"  . ESRD (end stage renal disease) on dialysis ([place])    "started 12/2011; [place]; TTS" (08/23/2012  . Exertional shortness of breath   . Eye drainage    "lots; since fall 07/2011" (08/23/2012)  . Gout   . H/O hiatal hernia   . Headache(784.0)    "weekly sometimes; since I fell and hit my head in 07/2011 08/23/2012)  . Heart murmur   . History of blood transfusion    "lots before starting dialysis" (08/23/2012)  . Hypertension    sees Dr.  Willey Blade  . Inhalation injury    "worked at [employer]; can't inhale polyurethane or paint, etc" (08/23/2012)  . Myocardial infarction ([place]) 1970's or 1980's  . Neuromuscular disorder ([place])    diabetic neuropathy  . OSA on CPAP   . Staph aureus infection November 2012   . Swelling of both ankles 12-17-10  . Type II diabetes mellitus ([place])    "over 10 years now" (08/23/2012)   Past Surgical History:  Procedure Laterality Date  . APPENDECTOMY    . AV FISTULA PLACEMENT  09/30/2011   Procedure: ARTERIOVENOUS (AV) FISTULA CREATION;  Surgeon: Conrad Bode, MD;  Location: [place];  Service: Vascular;  Laterality: Left;  BRACHIAL-CEPHALIC  . BACK SURGERY    . BASCILIC VEIN TRANSPOSITION Left 11/10/2011   Left arm   . [place] TRANSPOSITION  01/28/2012   Procedure: BASCILIC VEIN TRANSPOSITION;  Surgeon: Conrad [place], MD;  Location: [place];  Service: Vascular;  Laterality: Left;  Second Stage   . CARDIAC CATHETERIZATION  1980's  . CATARACT EXTRACTION W/ INTRAOCULAR LENS IMPLANT Bilateral 1990's  . DILATION AND CURETTAGE OF UTERUS  1960's  . EYE SURGERY    . FISTULA SUPERFICIALIZATION Left 02/18/2017   Procedure: SUPERFICIALIZATION LEFT ARM ARTERIOVENOUS FISTULA;  Surgeon: Serafina Mitchell, MD;  Location: [place];  Service: Vascular;  Laterality: Left;  . HERNIA REPAIR    .  INSERTION OF DIALYSIS CATHETER  01/05/2012   Procedure: INSERTION OF DIALYSIS CATHETER;  Surgeon: Conrad [place], MD;  Location: [place];  Service: Vascular;  Laterality: N/A;  Right Internal Jugular Placement  . INTRAOCULAR PROSTHESES INSERTION Right 2013   "fell; knocked my eye outl" (08/23/2012)  . JOINT REPLACEMENT    . LAPAROTOMY  12/18/2010   Procedure: EXPLORATORY LAPAROTOMY;  Surgeon: Rolm Bookbinder, MD;  Location: [facility];  Service: General;  Laterality: N/A;  Abdominal Seroma Evacuation  . LEFT HEART CATHETERIZATION WITH CORONARY ANGIOGRAM N/A 08/25/2012   Procedure: LEFT HEART CATHETERIZATION WITH CORONARY  ANGIOGRAM;  Surgeon: Birdie Riddle, MD;  Location: [facility];  Service: Cardiovascular;  Laterality: N/A;  . LUMBAR DISC SURGERY  1970'-80's   X 3  . TOTAL KNEE ARTHROPLASTY Left 1980's  . VAGINAL HYSTERECTOMY  1970's  . VENTRAL HERNIA REPAIR  2012-2013   component separation, repair with biologic; "had 3 surgeries to fix it" (08/23/2012)  . WOUND DEBRIDEMENT  03/20/2011   Procedure: DEBRIDEMENT ABDOMINAL WOUND;  Surgeon: Rolm Bookbinder, MD;  Location: [place];  Service: General;  Laterality: N/A;  debridement abdominal wall, placement of wound vac   Family History  Problem Relation Age of Onset  . Hypertension Mother   . Cancer Brother        spine  . Hypertension Father   . Cancer Sister        brain  . Hypertension Maternal Grandmother   . Anesthesia problems Neg Hx    Social History:  reports that she quit smoking about 36 years ago. Her smoking use included cigarettes. She has a 4.00 pack-year smoking history. she has never used smokeless tobacco. She reports that she does not drink alcohol or use drugs. Allergies  Allergen Reactions  . Lyrica [Pregabalin] Other (See Comments)    Hallucinations   Prior to Admission medications   Medication Sig Start Date End Date Taking? Authorizing Provider  albuterol (PROVENTIL HFA;VENTOLIN HFA) 108 (90 BASE) MCG/ACT inhaler Inhale 2 puffs into the lungs every 4 (four) hours as needed for wheezing or shortness of breath.   Yes [provider]  albuterol (PROVENTIL) (2.5 MG/3ML) 0.083% nebulizer solution Take 3 mLs (2.5 mg total) by nebulization every 2 (two) hours as needed for shortness of breath. 10/21/16  Yes Elwin Mocha, MD  aspirin EC 81 MG tablet Take 81 mg by mouth daily.     Yes [provider]  carvedilol (COREG) 25 MG tablet Take 25 mg by mouth 2 (two) times daily.  09/26/16  Yes [provider]  cinacalcet (SENSIPAR) 90 MG tablet Take 1 tablet (90 mg) by mouth daily. On dialysis days, take the dose  after dialysis   Yes [provider]  diphenhydrAMINE (BENADRYL) 25 mg capsule Take 50 mg by mouth See admin instructions. Take 2 tablets (50 mg) by mouth prior to dialysis on Tuesday, Thursday, Saturday - for itching   Yes [provider]  ethyl chloride spray Apply 1 application topically See admin instructions. Apply topically before dialysis on Tuesday, Thursday, Saturday 01/10/16  Yes [provider]  HYDROcodone-acetaminophen (NORCO) 5-325 MG tablet Take 1 tablet by mouth every 6 (six) hours as needed for moderate pain. 02/18/17  Yes Dagoberto Ligas, PA-C  insulin glargine (LANTUS) 100 UNIT/ML injection Inject 0.1 mLs (10 Units total) into the skin at bedtime. Patient taking differently: Inject 5 Units into the skin at bedtime.  10/21/16  Yes Elwin Mocha, MD  midodrine ([place]) 10  MG tablet Take 10 mg by mouth daily.  09/26/16  Yes [provider]  Multiple Vitamin (MULITIVITAMIN WITH MINERALS) TABS Take 1 tablet by mouth daily.   Yes [provider]  sevelamer carbonate (RENVELA) 800 MG tablet Take 1 tablet (800 mg total) by mouth 3 (three) times daily with meals. Patient taking differently: Take 1,600-2,400 mg by mouth See admin instructions. Take 3 tablets (2,400 mg) by mouth 3 times daily with meals. Take 2 tablets (1,600 mg) by mouth with snacks. Receives supply from dialysis center. 02/12/16  Yes Dixie Dials, MD  tiotropium (SPIRIVA) 18 MCG inhalation capsule Place 1 capsule (18 mcg total) into inhaler and inhale daily. 10/21/16  Yes Elwin Mocha, MD   Current Facility-Administered Medications  Medication Dose Route Frequency Provider Last Rate Last Dose  . albuterol (PROVENTIL) (2.5 MG/3ML) 0.083% nebulizer solution 2.5 mg  2.5 mg Nebulization Q6H Dixie Dials, MD      . aspirin chewable tablet 324 mg  324 mg Oral NOW Charolette Forward, MD       Or  . aspirin suppository 300 mg  300 mg Rectal NOW Charolette Forward, MD      . aspirin EC  tablet 81 mg  81 mg Oral Daily Charolette Forward, MD   81 mg at 04/03/17 0900  . atorvastatin (LIPITOR) tablet 40 mg  40 mg Oral QHS Charolette Forward, MD   40 mg at 04/02/17 2242  . heparin ADULT infusion 100 units/mL (25000 units/224mL sodium chloride 0.45%)  1,300 Units/hr Intravenous Continuous Erenest Blank, RPH 13 mL/hr at 04/03/17 0032 1,300 Units/hr at 04/03/17 0032  . insulin aspart (novoLOG) injection 0-9 Units  0-9 Units Subcutaneous TID WC Charolette Forward, MD      . metoprolol tartrate (LOPRESSOR) tablet 12.5 mg  12.5 mg Oral BID Charolette Forward, MD   12.5 mg at 04/02/17 2242  . nitroGLYCERIN (NITROGLYN) 2 % ointment 0.5 inch  0.5 inch Topical Q8H Charolette Forward, MD   0.5 inch at 04/03/17 0620  . nitroGLYCERIN (NITROSTAT) SL tablet 0.4 mg  0.4 mg Sublingual Q5 Min x 3 PRN Charolette Forward, MD      . ondansetron (ZOFRAN) injection 4 mg  4 mg Intravenous Q6H PRN Charolette Forward, MD      . regadenoson (LEXISCAN) 0.4 MG/5ML injection SOLN           . regadenoson (LEXISCAN) 0.4 MG/5ML injection SOLN           . sevelamer carbonate (RENVELA) tablet 800 mg  800 mg Oral TID WC Charolette Forward, MD      . tiotropium (SPIRIVA) inhalation capsule 18 mcg  18 mcg Inhalation Daily Charolette Forward, MD        ROS: As per HPI otherwise negative.  Physical Exam: Vitals:   04/03/17 1238 04/03/17 1300 04/03/17 1302 04/03/17 1304  BP: (!) 110/54 (!) 102/46 (!) 115/48 (!) 109/48  Pulse: 69 78 85 82  Resp:      Temp:      TempSrc:      SpO2:      Weight:      Height:         General: Obese female NAD Head: NCAT sclera not icteric MMM Neck: Supple. No JVD No masses Lungs: Normal resp effort. CTAB  Heart: distant RRR  Abdomen: soft NT + BS Lower extremities:without edema or ischemic changes, no open wounds  Neuro: A & O  X 3. Moves all extremities spontaneously. Psych:  Responds to questions  appropriately with a normal affect. Dialysis Access:L IJ TDC dsg clean; LUE AVF +bruit  Labs: Basic Metabolic  Panel: Recent Labs  Lab 04/02/17 1339 04/03/17 0708  NA 136 136  K 4.7 5.0  CL 97* 97*  CO2 25 24  GLUCOSE 118* 89  BUN 24* 38*  CREATININE 5.14* 7.35*  CALCIUM 8.0* 8.9   Liver Function Tests: No results for input(s): AST, ALT, ALKPHOS, BILITOT, PROT, ALBUMIN in the last 168 hours. No results for input(s): LIPASE, AMYLASE in the last 168 hours. No results for input(s): AMMONIA in the last 168 hours. CBC: Recent Labs  Lab 04/02/17 1339 04/03/17 0708  WBC 8.0 7.0  HGB 10.5* 10.1*  HCT 34.0* 32.6*  MCV 86.3 86.7  PLT 228 226   Cardiac Enzymes: Recent Labs  Lab 04/02/17 2114 04/02/17 2349 04/03/17 0708  TROPONINI <0.03 <0.03 <0.03   CBG: Recent Labs  Lab 04/02/17 2114 04/03/17 0746 04/03/17 1105  GLUCAP 153* 93 92   Iron Studies: No results for input(s): IRON, TIBC, TRANSFERRIN, FERRITIN in the last 72 hours. Studies/Results: Dg Chest 2 View  Result Date: 04/02/2017 CLINICAL DATA:  Chest pain following dialysis EXAM: CHEST - 2 VIEW COMPARISON:  Chest radiograph October 16, 2016 and chest CT October 16, 2016 FINDINGS: Central catheter tip is in the right atrium slightly distal to the cavoatrial junction. No pneumothorax. There is no appreciable edema or consolidation. Heart is upper normal in size with pulmonary vascularity within normal limits. No adenopathy. There is aortic atherosclerosis. No bone lesions. IMPRESSION: Central catheter tip in right atrium. No pneumothorax. No edema or consolidation. Heart upper normal in size. There is aortic atherosclerosis. Aortic Atherosclerosis (ICD10-I70.0). Electronically Signed   By: Lowella Grip III M.D.   On: 04/02/2017 14:25    Dialysis Orders:  [facility] 4.5h 180 NRe BFR 450/800 EDW 124 2K/2Ca Hep 13000 Venofer 100mg  IV x5 (4/5 dosed) Mircera 169mcg IV q 2weeks (last 3/7) Calcitriol 0.5 mcg PO TIW   Assessment/Plan: 1.  Chest pain- For nuclear stress test today/ per cards.  2.  ESRD -  TTS. No indication  for urgent HD today. HD tomorrow on schedule  3.  Hypertension/volume  - BP low/normal. No volume excess on exam. Got below EDW last OP HD post wt 123kg with  net UF 5L.Challenge EDW as tolerated  4.  Anemia  - Hgb 10.1 on ESA  5.  Metabolic bone disease -  Cont VDRA/Renvela binder/Sensipar   Lynnda Child PA-C [place] Pager [phone] 04/03/2017, 2:26 PM

## 2017-04-03 NOTE — Plan of Care (Signed)
  Progressing Clinical Measurements: Will remain free from infection 04/03/2017 0301 - Progressing by Colonel Bald, RN Note No s/s of infection noted. Respiratory complications will improve 04/03/2017 0301 - Progressing by Colonel Bald, RN Note No s/s of respiratory complications noted. Cardiovascular complication will be avoided 04/03/2017 0301 - Progressing by Colonel Bald, RN Note No s/s of cardiovascular complication. Pain Managment: General experience of comfort will improve 04/03/2017 0301 - Progressing by Colonel Bald, RN Note Denies c/o pain or discomfort.

## 2017-04-03 NOTE — Care Management Note (Signed)
Case Management Note  Patient Details  Name: LAMAE FOSCO MRN: [id number] Date of Birth: 01/20/1946  Subjective/Objective:   Pt presented for Chest Pain and SOB- Plan for 2 day stress test. PTA from home alone and has support of children. Pt has RW, Editor, commissioning. Pt has HD T, TH, SAT- CM did make Staff RN aware no HD orders seen during assessment of patient- RN to call MD. She receives transportation assistance to physician appointments and pharmacy through [employer]).               Action/Plan: Pt has HH Aide 5 days a week for 2 hours a day (unable to remember name of agency). No Home needs identified at this time.Pt states she will have transportation home via [employer] friends. No further needs from CM at this time.     Expected Discharge Date:                  Expected Discharge Plan:  Home/Self Care  In-House Referral:  NA  Discharge planning Services  CM Consult  Post Acute Care Choice:  NA Choice offered to:  NA  DME Arranged:  N/A DME Agency:  NA  HH Arranged:  NA HH Agency:  NA  Status of Service:  Completed, signed off  If discussed at [place] of Stay Meetings, dates discussed:    Additional Comments:  Bethena Roys, RN 04/03/2017, 11:45 AM

## 2017-04-03 NOTE — Progress Notes (Signed)
[place] for Heparin Indication: chest pain/ACS  Allergies  Allergen Reactions  . Lyrica [Pregabalin] Other (See Comments)    Hallucinations    Patient Measurements: Height: 5\' 6"  (167.6 cm) Weight: 270 lb 11.2 oz (122.8 kg) IBW/kg (Calculated) : 59.3 Heparin Dosing Weight: 89 kg  Vital Signs: Temp: 97.7 F (36.5 C) (03/15 0513) Temp Source: Oral (03/15 0513) BP: 109/48 (03/15 1304) Pulse Rate: 82 (03/15 1304)  Labs: Recent Labs    04/02/17 1339 04/02/17 2114 04/02/17 2349 04/03/17 0708 04/03/17 0904  HGB 10.5*  --   --  10.1*  --   HCT 34.0*  --   --  32.6*  --   PLT 228  --   --  226  --   HEPARINUNFRC  --   --  0.22*  --  0.35  CREATININE 5.14*  --   --  7.35*  --   TROPONINI  --  <0.03 <0.03 <0.03  --     Estimated Creatinine Clearance: 9.1 mL/min (A) (by C-G formula based on SCr of 7.35 mg/dL (H)).   Medical History: Past Medical History:  Diagnosis Date  . Abdominal abscess 12-17-10   abdominal abscesses x2 ? one at this time  . Anemia   . Arthritis    "all over" (08/23/2012)  . Blind right eye    fell and crushed socket and eye fell out, was replaced at [facility]  . CHF (congestive heart failure) ([place])   . Chronic lower back pain   . Coronary artery disease    normal coronaries by 08/25/12 cath  . Dyspnea   . Dysrhythmia    irregular, "skips beats"  . ESRD (end stage renal disease) on dialysis ([place])    "started 12/2011; [place]; TTS" (08/23/2012  . Exertional shortness of breath   . Eye drainage    "lots; since fall 07/2011" (08/23/2012)  . Gout   . H/O hiatal hernia   . Headache(784.0)    "weekly sometimes; since I fell and hit my head in 07/2011 08/23/2012)  . Heart murmur   . History of blood transfusion    "lots before starting dialysis" (08/23/2012)  . Hypertension    sees Dr. Willey Blade  . Inhalation injury    "worked at [employer]; can't inhale polyurethane or paint, etc" (08/23/2012)  .  Myocardial infarction ([place]) 1970's or 1980's  . Neuromuscular disorder ([place])    diabetic neuropathy  . OSA on CPAP   . Staph aureus infection November 2012   . Swelling of both ankles 12-17-10  . Type II diabetes mellitus ([place])    "over 10 years now" (08/23/2012)    Assessment: 67 YOF with ESRD who presented to the [facility] on 3/14 with CP. Pharmacy consulted to start Heparin for ACS/CP.   The last surgery noted was a fistula superficialization in Jan '19. ESRD with noted anemia - . No anticoagulation noted PTA.   3/15 AM:  heparin level 0.35 therapeutic on heparin drip rate 1300 units/hr. No bleeding noted.  hgb low /stable at 10.1 and pltc wnl   Goal of Therapy:  Heparin level 0.3-0.7 units/ml Monitor platelets by anticoagulation protocol: Yes   Plan:  Continue  heparin to 1300 units/hr -1700 HL to confirm Daily HL, CBC   Thank you for allowing pharmacy to be part of this patients care team. Nicole Cella, [place] Clinical Pharmacist Pager: [phone] or [phone] (330p-1030p) Main Rx [phone] 04/03/2017 2:42 PM

## 2017-04-04 ENCOUNTER — Observation Stay (HOSPITAL_COMMUNITY): Payer: Medicare HMO

## 2017-04-04 DIAGNOSIS — R079 Chest pain, unspecified: Secondary | ICD-10-CM | POA: Diagnosis not present

## 2017-04-04 LAB — RENAL FUNCTION PANEL
Albumin: 3.1 g/dL — ABNORMAL LOW (ref 3.5–5.0)
Anion gap: 17 — ABNORMAL HIGH (ref 5–15)
BUN: 65 mg/dL — ABNORMAL HIGH (ref 6–20)
CALCIUM: 8.3 mg/dL — AB (ref 8.9–10.3)
CHLORIDE: 94 mmol/L — AB (ref 101–111)
CO2: 22 mmol/L (ref 22–32)
CREATININE: 9.35 mg/dL — AB (ref 0.44–1.00)
GFR calc non Af Amer: 4 mL/min — ABNORMAL LOW (ref >60)
GFR, EST AFRICAN AMERICAN: 4 mL/min — AB (ref >60)
GLUCOSE: 97 mg/dL (ref 65–99)
Phosphorus: 8 mg/dL — ABNORMAL HIGH (ref 2.5–4.6)
Potassium: 4.8 mmol/L (ref 3.5–5.1)
SODIUM: 133 mmol/L — AB (ref 135–145)

## 2017-04-04 LAB — HEPARIN LEVEL (UNFRACTIONATED)
HEPARIN UNFRACTIONATED: 0.3 [IU]/mL (ref 0.30–0.70)
Heparin Unfractionated: 2 IU/mL — ABNORMAL HIGH (ref 0.30–0.70)

## 2017-04-04 LAB — CBC
HCT: 31.2 % — ABNORMAL LOW (ref 36.0–46.0)
HEMOGLOBIN: 9.9 g/dL — AB (ref 12.0–15.0)
MCH: 27.1 pg (ref 26.0–34.0)
MCHC: 31.7 g/dL (ref 30.0–36.0)
MCV: 85.5 fL (ref 78.0–100.0)
Platelets: 212 10*3/uL (ref 150–400)
RBC: 3.65 MIL/uL — AB (ref 3.87–5.11)
RDW: 20.9 % — ABNORMAL HIGH (ref 11.5–15.5)
WBC: 7.8 10*3/uL (ref 4.0–10.5)

## 2017-04-04 LAB — GLUCOSE, CAPILLARY
GLUCOSE-CAPILLARY: 170 mg/dL — AB (ref 65–99)
Glucose-Capillary: 108 mg/dL — ABNORMAL HIGH (ref 65–99)
Glucose-Capillary: 145 mg/dL — ABNORMAL HIGH (ref 65–99)

## 2017-04-04 MED ORDER — SODIUM CHLORIDE 0.9 % IV SOLN: 100.0000 mL | INTRAVENOUS | Status: DC | PRN

## 2017-04-04 MED ORDER — MIDODRINE HCL 5 MG PO TABS
10.0000 mg | ORAL_TABLET | Freq: Once | ORAL | Status: AC
  Administered 2017-04-04: 10 mg via ORAL

## 2017-04-04 MED ORDER — PENTAFLUOROPROP-TETRAFLUOROETH EX AERO: 1.0000 "application " | INHALATION_SPRAY | CUTANEOUS | Status: DC | PRN

## 2017-04-04 MED ORDER — ALTEPLASE 2 MG IJ SOLR: 2.0000 mg | Freq: Once | INTRAMUSCULAR | Status: DC | PRN

## 2017-04-04 MED ORDER — TECHNETIUM TC 99M TETROFOSMIN IV KIT
30.0000 | PACK | Freq: Once | INTRAVENOUS | Status: AC | PRN
  Administered 2017-04-04: 30 via INTRAVENOUS
  Filled 2017-04-04: qty 30

## 2017-04-04 MED ORDER — LIDOCAINE-PRILOCAINE 2.5-2.5 % EX CREA: 1.0000 "application " | TOPICAL_CREAM | CUTANEOUS | Status: DC | PRN

## 2017-04-04 MED ORDER — HEPARIN SODIUM (PORCINE) 1000 UNIT/ML DIALYSIS: 1000.0000 [IU] | INTRAMUSCULAR | Status: DC | PRN

## 2017-04-04 MED ORDER — LIDOCAINE HCL (PF) 1 % IJ SOLN: 5.0000 mL | INTRAMUSCULAR | Status: DC | PRN

## 2017-04-04 MED ORDER — MIDODRINE HCL 5 MG PO TABS
ORAL_TABLET | ORAL | Status: AC
  Filled 2017-04-04: qty 2

## 2017-04-04 MED ORDER — ALBUTEROL SULFATE (2.5 MG/3ML) 0.083% IN NEBU
2.5000 mg | INHALATION_SOLUTION | Freq: Three times a day (TID) | RESPIRATORY_TRACT | Status: DC
  Administered 2017-04-04 – 2017-04-08 (×10): 2.5 mg via RESPIRATORY_TRACT
  Filled 2017-04-04 (×12): qty 3

## 2017-04-04 NOTE — Progress Notes (Signed)
ANTICOAGULATION CONSULT NOTE  Pharmacy Consult:  Heparin Indication: chest pain/ACS  Allergies  Allergen Reactions  . Lyrica [Pregabalin] Other (See Comments)    Hallucinations   Patient Measurements: Height: 5\' 6"  (167.6 cm) Weight: 274 lb 3.2 oz (124.4 kg) IBW/kg (Calculated) : 59.3 Heparin Dosing Weight: 89 kg  Vital Signs: Temp: 98 F (36.7 C) (03/16 0508) Temp Source: Oral (03/16 0508) BP: 114/46 (03/16 0508) Pulse Rate: 71 (03/16 0508)  Labs: Recent Labs    04/02/17 1339 04/02/17 2114 04/02/17 2349 04/03/17 0708  04/03/17 1616 04/03/17 1924 04/04/17 0449  HGB 10.5*  --   --  10.1*  --   --   --  9.9*  HCT 34.0*  --   --  32.6*  --   --   --  31.2*  PLT 228  --   --  226  --   --   --  212  HEPARINUNFRC  --   --  0.22*  --    < > >2.20* 0.30 0.30  CREATININE 5.14*  --   --  7.35*  --   --   --   --   TROPONINI  --  <0.03 <0.03 <0.03  --   --   --   --    < > = values in this interval not displayed.   Estimated Creatinine Clearance: 9.2 mL/min (A) (by C-G formula based on SCr of 7.35 mg/dL (H)).  Assessment: 59 yoF with ESRD presented to [facility] on 3/14 with CP. Pharmacy consulted to start heparin for ACS. Last surgery noted was fistula superficialization in Jan '19. No anticoagulation PTA. Heparin level remains just therapeutic at 0.30 this AM after slight rate increase. Hgb 9.9, pltc WNL - stable. No bleeding noted.  Goal of Therapy:  Heparin level 0.3-0.7 units/ml Monitor platelets by anticoagulation protocol: Yes   Plan:  Increase heparin gtt to 1450 units/hr Recheck heparin level in 8 hours Daily CBC and heparin level Monitor s/sx of bleeding  Enna Warwick N. Gerarda Fraction, PharmD PGY1 Pharmacy Resident Pager: [phone] 04/04/2017, 8:14 AM

## 2017-04-04 NOTE — Progress Notes (Signed)
Subjective:  Seen in hemodialysis Department tolerating hemodialysis had 1 episode of hypotension. Complains of shortness of breath denies any chest pain at present. Nuclear stress test results are still pending Objective:  Vital Signs in the last 24 hours: Temp:  [97.7 F (36.5 C)-98 F (36.7 C)] 98 F (36.7 C) (03/16 0830) Pulse Rate:  [64-85] 71 (03/16 1030) Resp:  [13-18] 16 (03/16 1030) BP: (89-121)/(42-62) 89/58 (03/16 1030) SpO2:  [94 %-100 %] 100 % (03/16 1030) Weight:  [124.4 kg (274 lb 3.2 oz)-124.5 kg (274 lb 7.6 oz)] 124.5 kg (274 lb 7.6 oz) (03/16 0830)  Intake/Output from previous day: 03/15 0701 - 03/16 0700 In: 822.5 [P.O.:600; I.V.:222.5] Out: -  Intake/Output from this shift: No intake/output data recorded.  Physical Exam: Neck: no adenopathy, no carotid bruit, no JVD and supple, symmetrical, trachea midline Lungs: Decreased breath sound at bases Heart: regular rate and rhythm, S1, S2 normal and Soft systolic murmur noted no pericardial rub Abdomen: soft, non-tender; bowel sounds normal; no masses,  no organomegaly Extremities: extremities normal, atraumatic, no cyanosis or edema  Lab Results: Recent Labs    04/03/17 0708 04/04/17 0449  WBC 7.0 7.8  HGB 10.1* 9.9*  PLT 226 212   Recent Labs    04/03/17 0708 04/04/17 0613  NA 136 133*  K 5.0 4.8  CL 97* 94*  CO2 24 22  GLUCOSE 89 97  BUN 38* 65*  CREATININE 7.35* 9.35*   Recent Labs    04/02/17 2349 04/03/17 0708  TROPONINI <0.03 <0.03   Hepatic Function Panel Recent Labs    04/04/17 0613  ALBUMIN 3.1*   Recent Labs    04/03/17 0709  CHOL 79   No results for input(s): PROTIME in the last 72 hours.  Imaging: Imaging results have been reviewed and Dg Chest 2 View  Result Date: 04/02/2017 CLINICAL DATA:  Chest pain following dialysis EXAM: CHEST - 2 VIEW COMPARISON:  Chest radiograph October 16, 2016 and chest CT October 16, 2016 FINDINGS: Central catheter tip is in the right  atrium slightly distal to the cavoatrial junction. No pneumothorax. There is no appreciable edema or consolidation. Heart is upper normal in size with pulmonary vascularity within normal limits. No adenopathy. There is aortic atherosclerosis. No bone lesions. IMPRESSION: Central catheter tip in right atrium. No pneumothorax. No edema or consolidation. Heart upper normal in size. There is aortic atherosclerosis. Aortic Atherosclerosis (ICD10-I70.0). Electronically Signed   By: Lowella Grip III M.D.   On: 04/02/2017 14:25    Cardiac Studies:  Assessment/Plan:  Unstable angina MI ruled out Coronary artery disease history of PCI in remote past Decompensated congestive heart failure secondary to preserved LV systolic function Hypertension Diabetes mellitus COPD Obstructive sleep apnea Morbid obesity End-stage renal disease on hemodialysis Hiatus hernia Anemia of chronic disease Blindness right eye History of questionable lacunar infarct in the past. Plan Continue present management Check nuclear stress test results   LOS: 0 days    [name] 04/04/2017, 11:12 AM

## 2017-04-04 NOTE — Progress Notes (Signed)
ANTICOAGULATION CONSULT NOTE  Pharmacy Consult:  Heparin Indication: chest pain/ACS  Allergies  Allergen Reactions  . Lyrica [Pregabalin] Other (See Comments)    Hallucinations   Patient Measurements: Height: 5\' 6"  (167.6 cm) Weight: 267 lb 13.7 oz (121.5 kg) IBW/kg (Calculated) : 59.3 Heparin Dosing Weight: 89 kg  Vital Signs: Temp: 98 F (36.7 C) (03/16 1327) Temp Source: Oral (03/16 1327) BP: 98/52 (03/16 1327) Pulse Rate: 73 (03/16 1327)  Labs: Recent Labs    04/02/17 1339 04/02/17 2114 04/02/17 2349 04/03/17 0708  04/03/17 1924 04/04/17 0449 04/04/17 0613 04/04/17 1625  HGB 10.5*  --   --  10.1*  --   --  9.9*  --   --   HCT 34.0*  --   --  32.6*  --   --  31.2*  --   --   PLT 228  --   --  226  --   --  212  --   --   HEPARINUNFRC  --   --  0.22*  --    < > 0.30 0.30  --  2.00*  CREATININE 5.14*  --   --  7.35*  --   --   --  9.35*  --   TROPONINI  --  <0.03 <0.03 <0.03  --   --   --   --   --    < > = values in this interval not displayed.   Estimated Creatinine Clearance: 7.1 mL/min (A) (by C-G formula based on SCr of 9.35 mg/dL (H)).  Assessment: 35 yoF with ESRD presented [facility] on 3/14 with CP. Pharmacy consulted to start heparin for ACS. Last surgery noted was fistula superficialization in Jan '19. No anticoagulation PTA. Heparin level returns at 2.0 after rate increase but feel this was drawn from the HD port.  Will continue current heparin rate and recheck labs in the morning.    Goal of Therapy:  Heparin level 0.3-0.7 units/ml Monitor platelets by anticoagulation protocol: Yes   Plan:  Increase heparin gtt to 1450 units/hr Daily CBC and heparin level Monitor s/sx of bleeding  Rober Minion, PharmD., MS Clinical Pharmacist Pager:  [phone] Thank you for allowing pharmacy to be part of this patients care team. 04/04/2017, 5:42 PM

## 2017-04-04 NOTE — Procedures (Signed)
I was present at this dialysis session. I have reviewed the session itself and made appropriate changes.   UF Goal 3L. 2K bath. Midodrine pre HD has chronic IDH. Nuc ST results pending. On hep gtt.  Hb stable.  Filed Weights   04/02/17 2220 04/04/17 0508 04/04/17 0830  Weight: 122.8 kg (270 lb 11.2 oz) 124.4 kg (274 lb 3.2 oz) 124.5 kg (274 lb 7.6 oz)    Recent Labs  Lab 04/03/17 0708  NA 136  K 5.0  CL 97*  CO2 24  GLUCOSE 89  BUN 38*  CREATININE 7.35*  CALCIUM 8.9    Recent Labs  Lab 04/02/17 1339 04/03/17 0708 04/04/17 0449  WBC 8.0 7.0 7.8  HGB 10.5* 10.1* 9.9*  HCT 34.0* 32.6* 31.2*  MCV 86.3 86.7 85.5  PLT 228 226 212    Scheduled Meds: . albuterol  2.5 mg Nebulization Q6H  . aspirin EC  81 mg Oral Daily  . atorvastatin  40 mg Oral QHS  . insulin aspart  0-9 Units Subcutaneous TID WC  . metoprolol tartrate  12.5 mg Oral BID  . nitroGLYCERIN  0.5 inch Topical Q8H  . sevelamer carbonate  800 mg Oral TID WC  . tiotropium  18 mcg Inhalation Daily   Continuous Infusions: . sodium chloride    . sodium chloride    . heparin 1,450 Units/hr (04/04/17 0916)   PRN Meds:.sodium chloride, sodium chloride, alteplase, heparin, heparin, lidocaine (PF), lidocaine-prilocaine, nitroGLYCERIN, ondansetron (ZOFRAN) IV, pentafluoroprop-tetrafluoroeth   Pearson Grippe  MD 04/04/2017, 9:24 AM

## 2017-04-05 LAB — CBC
HEMATOCRIT: 30.9 % — AB (ref 36.0–46.0)
HEMOGLOBIN: 9.5 g/dL — AB (ref 12.0–15.0)
MCH: 26 pg (ref 26.0–34.0)
MCHC: 30.7 g/dL (ref 30.0–36.0)
MCV: 84.7 fL (ref 78.0–100.0)
Platelets: 216 10*3/uL (ref 150–400)
RBC: 3.65 MIL/uL — ABNORMAL LOW (ref 3.87–5.11)
RDW: 20.5 % — AB (ref 11.5–15.5)
WBC: 7.8 10*3/uL (ref 4.0–10.5)

## 2017-04-05 LAB — GLUCOSE, CAPILLARY
GLUCOSE-CAPILLARY: 178 mg/dL — AB (ref 65–99)
Glucose-Capillary: 96 mg/dL (ref 65–99)
Glucose-Capillary: 176 mg/dL — ABNORMAL HIGH (ref 65–99)
Glucose-Capillary: 82 mg/dL (ref 65–99)

## 2017-04-05 LAB — HEPARIN LEVEL (UNFRACTIONATED): Heparin Unfractionated: 0.34 IU/mL (ref 0.30–0.70)

## 2017-04-05 MED ORDER — CLOPIDOGREL BISULFATE 75 MG PO TABS
75.0000 mg | ORAL_TABLET | Freq: Every day | ORAL | Status: DC
  Administered 2017-04-05 – 2017-04-09 (×5): 75 mg via ORAL
  Filled 2017-04-05 (×5): qty 1

## 2017-04-05 NOTE — Progress Notes (Signed)
[facility] Progress Note   Subjective: Up in chair. Says she feels slightly SOB. O2 sats checked-98% on RA. Denies chest pain.   Objective Vitals:   04/04/17 2031 04/05/17 0426 04/05/17 0846 04/05/17 0905  BP: (!) 83/37 (!) 91/43  113/73  Pulse: (!) 40 71  81  Resp: 18 19    Temp: 98.3 F (36.8 C) 97.7 F (36.5 C)    TempSrc: Oral Oral    SpO2: 93% 95% 95%   Weight:  122.4 kg (269 lb 12.8 oz)    Height:       Physical Exam General: WN WD elderly female Heart: HS distant W3,U8, 2/6 systolic M.  Lungs: CTAB slightly decreased in bases Abdomen: obese, active BS Extremities: no LE edema Dialysis Access: LUA AVF + bruit [facility] CDI.   Additional Objective Labs: Basic Metabolic Panel: Recent Labs  Lab 04/02/17 1339 04/03/17 0708 04/04/17 0613  NA 136 136 133*  K 4.7 5.0 4.8  CL 97* 97* 94*  CO2 25 24 22   GLUCOSE 118* 89 97  BUN 24* 38* 65*  CREATININE 5.14* 7.35* 9.35*  CALCIUM 8.0* 8.9 8.3*  PHOS  --   --  8.0*   Liver Function Tests: Recent Labs  Lab 04/04/17 0613  ALBUMIN 3.1*   No results for input(s): LIPASE, AMYLASE in the last 168 hours. CBC: Recent Labs  Lab 04/02/17 1339 04/03/17 0708 04/04/17 0449 04/05/17 0356  WBC 8.0 7.0 7.8 7.8  HGB 10.5* 10.1* 9.9* 9.5*  HCT 34.0* 32.6* 31.2* 30.9*  MCV 86.3 86.7 85.5 84.7  PLT 228 226 212 216   Blood Culture    Component Value Date/Time   SDES BLOOD RIGHT ANTECUBITAL 02/01/2016 0839   SPECREQUEST BOTTLES DRAWN AEROBIC AND ANAEROBIC 5CC 02/01/2016 0839   CULT NO GROWTH 5 DAYS 02/01/2016 0839   REPTSTATUS 02/06/2016 FINAL 02/01/2016 0839    Cardiac Enzymes: Recent Labs  Lab 04/02/17 2114 04/02/17 2349 04/03/17 0708  TROPONINI <0.03 <0.03 <0.03   CBG: Recent Labs  Lab 04/03/17 2158 04/04/17 1132 04/04/17 1634 04/04/17 2203 04/05/17 0741  GLUCAP 105* 108* 170* 145* 96   Iron Studies: No results for input(s): IRON, TIBC, TRANSFERRIN, FERRITIN in the last 72  hours. @lablastinr3 @ Studies/Results: Nm Myocar Multi W/spect W/wall Motion / Ef  Result Date: 04/04/2017 CLINICAL DATA:  Chest pain EXAM: MYOCARDIAL IMAGING WITH SPECT (REST AND PHARMACOLOGIC-STRESS) GATED LEFT VENTRICULAR WALL MOTION STUDY LEFT VENTRICULAR EJECTION FRACTION TECHNIQUE: Standard myocardial SPECT imaging was performed after resting intravenous injection of 10 mCi Tc-71m tetrofosmin. Subsequently, intravenous infusion of Lexiscan was performed under the supervision of the Cardiology staff. At peak effect of the drug, 30 mCi Tc-23m tetrofosmin was injected intravenously and standard myocardial SPECT imaging was performed. Quantitative gated imaging was also performed to evaluate left ventricular wall motion, and estimate left ventricular ejection fraction. COMPARISON:  None. FINDINGS: Perfusion: Small apical and inferolateral defects or larger on the stress imaging compared to rest concerning for areas of inducible ischemia with pharmacologic stress. Wall Motion: Mild LV chamber dilatation.  Global hypokinesis. Left Ventricular Ejection Fraction: 18 % End diastolic volume 92 ml End systolic volume 75 ml IMPRESSION: 1. Positive exam for apical and inferolateral areas of inducible ischemia with pharmacologic stress. 2. Mild LV chamber dilatation and global hypokinesis. 3. Left ventricular ejection fraction 18% 4. Non invasive risk stratification*: High *2012 Appropriate Use Criteria for Coronary Revascularization Focused Update: J Am Coll Cardiol. 8280;03(4):917-915. http://content.airportbarriers.com.aspx?articleid=1201161 Electronically Signed   By: Jerilynn Mages.  Shick M.D.   On: 04/04/2017 15:20   Medications: . heparin 1,450 Units/hr (04/04/17 2057)   . albuterol  2.5 mg Nebulization TID  . aspirin EC  81 mg Oral Daily  . atorvastatin  40 mg Oral QHS  . insulin aspart  0-9 Units Subcutaneous TID WC  . metoprolol tartrate  12.5 mg Oral BID  . nitroGLYCERIN  0.5 inch Topical Q8H  . sevelamer  carbonate  800 mg Oral TID WC  . tiotropium  18 mcg Inhalation Daily     Dialysis Orders:  [facility] 4.5h 180 NRe BFR 450/800 EDW 124 2K/2Ca Hep 13000 Units IV TIW Venofer 100mg  IV x5 (4/5 dosed) Mircera 170mcg IV q 2weeks (last 3/7) Calcitriol 0.5 mcg PO TIW   Assessment/Plan: 1.  Chest pain- Positive Nuc stress, low EF 18%. No plan for intervention yet. Per cardiology. .  2.  ESRD -  TTS. Next HD 04/07/17. Has been using one needle in AVF and port of TDC.  3.  Hypertension/volume  - BP on soft side, asyptomatic. HD 04/04/17 Net UF 3.0 liters post wt 121.5 kg. Now under EDW. Lower EDW on DC. 4.  Anemia  - Hgb 10.1 on ESA  5.  Metabolic bone disease -  Cont VDRA/Renvela binder/Sensipar Phos 8.0 Ca 8.3 C Ca  6. Nutrition: Albumin 3.1 Renal diet. Renal vit/nepro.  7.   H/O HFpEF.   Bostyn Kunkler H. Devanta Daniel NP-C 04/05/2017, 10:36 AM  [employer] [phone]

## 2017-04-05 NOTE — Progress Notes (Signed)
ANTICOAGULATION CONSULT NOTE  Pharmacy Consult:  Heparin Indication: chest pain/ACS  Allergies  Allergen Reactions  . Lyrica [Pregabalin] Other (See Comments)    Hallucinations   Patient Measurements: Height: 5\' 6"  (167.6 cm) Weight: 269 lb 12.8 oz (122.4 kg) IBW/kg (Calculated) : 59.3 Heparin Dosing Weight: 89 kg  Vital Signs: Temp: 97.7 F (36.5 C) (03/17 0426) Temp Source: Oral (03/17 0426) BP: 91/43 (03/17 0426) Pulse Rate: 71 (03/17 0426)  Labs: Recent Labs    04/02/17 1339 04/02/17 2114 04/02/17 2349 04/03/17 0708  04/04/17 0449 04/04/17 0613 04/04/17 1625 04/05/17 0356  HGB 10.5*  --   --  10.1*  --  9.9*  --   --  9.5*  HCT 34.0*  --   --  32.6*  --  31.2*  --   --  30.9*  PLT 228  --   --  226  --  212  --   --  216  HEPARINUNFRC  --   --  0.22*  --    < > 0.30  --  2.00* 0.34  CREATININE 5.14*  --   --  7.35*  --   --  9.35*  --   --   TROPONINI  --  <0.03 <0.03 <0.03  --   --   --   --   --    < > = values in this interval not displayed.   Estimated Creatinine Clearance: 7.1 mL/min (A) (by C-G formula based on SCr of 9.35 mg/dL (H)).  Assessment: [name] with ESRD presented to [facility] on 3/14 with CP. Pharmacy consulted to start heparin for ACS. Last surgery noted was fistula superficialization in Jan '19. No anticoagulation PTA. Heparin level remains therapeutic at 0.34 this AM. Hgb 9.5, pltc WNL - stable. No bleeding noted.  Goal of Therapy:  Heparin level 0.3-0.7 units/ml Monitor platelets by anticoagulation protocol: Yes   Plan:  Continue heparin gtt at1450 units/hr Daily CBC and heparin level Monitor s/sx of bleeding  Erin N. Gerarda Fraction, PharmD PGY1 Pharmacy Resident Pager: [phone] 04/05/2017, 7:07 AM

## 2017-04-05 NOTE — Progress Notes (Signed)
Subjective:  Denies any chest pains complains of shortness of breath Lexiscan Myoview showed infero-apical wall ischemia with mild LV dilatation EF of 18% which has been markedly depressed as compared to prior 2-D echo  Objective:  Vital Signs in the last 24 hours: Temp:  [97.7 F (36.5 C)-98.3 F (36.8 C)] 97.7 F (36.5 C) (03/17 0426) Pulse Rate:  [40-83] 81 (03/17 0905) Resp:  [16-19] 19 (03/17 0426) BP: (83-113)/(37-73) 113/73 (03/17 0905) SpO2:  [93 %-100 %] 95 % (03/17 0846) Weight:  [121.5 kg (267 lb 13.7 oz)-122.4 kg (269 lb 12.8 oz)] 122.4 kg (269 lb 12.8 oz) (03/17 0426)  Intake/Output from previous day: 03/16 0701 - 03/17 0700 In: -  Out: 3000  Intake/Output from this shift: No intake/output data recorded.  Physical Exam: Neck: no adenopathy, no carotid bruit, no JVD and supple, symmetrical, trachea midline Lungs: decreased breath sound at bases Heart: regular rate and rhythm, S1, S2 normal and soft systolic murmur noted  Abdomen: soft, non-tender; bowel sounds normal; no masses,  no organomegaly Extremities: extremities normal, atraumatic, no cyanosis or edema  Lab Results: Recent Labs    04/04/17 0449 04/05/17 0356  WBC 7.8 7.8  HGB 9.9* 9.5*  PLT 212 216   Recent Labs    04/03/17 0708 04/04/17 0613  NA 136 133*  K 5.0 4.8  CL 97* 94*  CO2 24 22  GLUCOSE 89 97  BUN 38* 65*  CREATININE 7.35* 9.35*   Recent Labs    04/02/17 2349 04/03/17 0708  TROPONINI <0.03 <0.03   Hepatic Function Panel Recent Labs    04/04/17 0613  ALBUMIN 3.1*   Recent Labs    04/03/17 0709  CHOL 79   No results for input(s): PROTIME in the last 72 hours.  Imaging: Imaging results have been reviewed and Nm Myocar Multi W/spect W/wall Motion / Ef  Result Date: 04/04/2017 CLINICAL DATA:  Chest pain EXAM: MYOCARDIAL IMAGING WITH SPECT (REST AND PHARMACOLOGIC-STRESS) GATED LEFT VENTRICULAR WALL MOTION STUDY LEFT VENTRICULAR EJECTION FRACTION TECHNIQUE: Standard  myocardial SPECT imaging was performed after resting intravenous injection of 10 mCi Tc-72m tetrofosmin. Subsequently, intravenous infusion of Lexiscan was performed under the supervision of the Cardiology staff. At peak effect of the drug, 30 mCi Tc-74m tetrofosmin was injected intravenously and standard myocardial SPECT imaging was performed. Quantitative gated imaging was also performed to evaluate left ventricular wall motion, and estimate left ventricular ejection fraction. COMPARISON:  None. FINDINGS: Perfusion: Small apical and inferolateral defects or larger on the stress imaging compared to rest concerning for areas of inducible ischemia with pharmacologic stress. Wall Motion: Mild LV chamber dilatation.  Global hypokinesis. Left Ventricular Ejection Fraction: 18 % End diastolic volume 92 ml End systolic volume 75 ml IMPRESSION: 1. Positive exam for apical and inferolateral areas of inducible ischemia with pharmacologic stress. 2. Mild LV chamber dilatation and global hypokinesis. 3. Left ventricular ejection fraction 18% 4. Non invasive risk stratification*: High *2012 Appropriate Use Criteria for Coronary Revascularization Focused Update: J Am Coll Cardiol. 8527;78(2):423-536. http://content.airportbarriers.com.aspx?articleid=[email or web address] Electronically Signed   By: Jerilynn Mages.  Shick M.D.   On: 04/04/2017 15:20    Cardiac Studies:  Assessment/Plan:  Unstable angina MI ruled out Abnormal nuclear stress test Coronary artery disease history of PCI in remote past Decompensated congestive heart failure secondary to depressed LV systolic function Hypertension Diabetes mellitus COPD Obstructive sleep apnea Morbid obesity End-stage renal disease on hemodialysis Hiatus hernia Anemia of chronic disease Blindness right eye History of questionable lacunar infarct in the past.  Plan Continue present management Discussed with patient regarding abnormal nuclear stress test and markedly depressed LV systolic  function and briefly regarding left cardiac catheterization possible PTCA stenting Dr. Dr. Doylene Canard to discuss with her further in a.m. Will recheck 2-D echo to look at LV systolic function Start clopidogrel as per orders   LOS: 0 days    Charolette Forward 04/05/2017, 11:20 AM

## 2017-04-06 ENCOUNTER — Inpatient Hospital Stay (HOSPITAL_COMMUNITY): Payer: Medicare HMO

## 2017-04-06 DIAGNOSIS — R079 Chest pain, unspecified: Secondary | ICD-10-CM | POA: Diagnosis present

## 2017-04-06 DIAGNOSIS — Z90710 Acquired absence of both cervix and uterus: Secondary | ICD-10-CM | POA: Diagnosis not present

## 2017-04-06 DIAGNOSIS — N186 End stage renal disease: Secondary | ICD-10-CM | POA: Diagnosis present

## 2017-04-06 DIAGNOSIS — G8929 Other chronic pain: Secondary | ICD-10-CM | POA: Diagnosis present

## 2017-04-06 DIAGNOSIS — E875 Hyperkalemia: Secondary | ICD-10-CM | POA: Diagnosis not present

## 2017-04-06 DIAGNOSIS — J440 Chronic obstructive pulmonary disease with acute lower respiratory infection: Secondary | ICD-10-CM | POA: Diagnosis present

## 2017-04-06 DIAGNOSIS — D638 Anemia in other chronic diseases classified elsewhere: Secondary | ICD-10-CM | POA: Diagnosis present

## 2017-04-06 DIAGNOSIS — I25110 Atherosclerotic heart disease of native coronary artery with unstable angina pectoris: Secondary | ICD-10-CM | POA: Diagnosis present

## 2017-04-06 DIAGNOSIS — J209 Acute bronchitis, unspecified: Secondary | ICD-10-CM | POA: Diagnosis present

## 2017-04-06 DIAGNOSIS — Z96652 Presence of left artificial knee joint: Secondary | ICD-10-CM | POA: Diagnosis present

## 2017-04-06 DIAGNOSIS — H5461 Unqualified visual loss, right eye, normal vision left eye: Secondary | ICD-10-CM | POA: Diagnosis present

## 2017-04-06 DIAGNOSIS — I959 Hypotension, unspecified: Secondary | ICD-10-CM | POA: Diagnosis present

## 2017-04-06 DIAGNOSIS — M109 Gout, unspecified: Secondary | ICD-10-CM | POA: Diagnosis present

## 2017-04-06 DIAGNOSIS — E1140 Type 2 diabetes mellitus with diabetic neuropathy, unspecified: Secondary | ICD-10-CM | POA: Diagnosis present

## 2017-04-06 DIAGNOSIS — I5023 Acute on chronic systolic (congestive) heart failure: Secondary | ICD-10-CM | POA: Diagnosis present

## 2017-04-06 DIAGNOSIS — M545 Low back pain: Secondary | ICD-10-CM | POA: Diagnosis present

## 2017-04-06 DIAGNOSIS — G4733 Obstructive sleep apnea (adult) (pediatric): Secondary | ICD-10-CM | POA: Diagnosis present

## 2017-04-06 DIAGNOSIS — E1122 Type 2 diabetes mellitus with diabetic chronic kidney disease: Secondary | ICD-10-CM | POA: Diagnosis present

## 2017-04-06 DIAGNOSIS — Z888 Allergy status to other drugs, medicaments and biological substances status: Secondary | ICD-10-CM | POA: Diagnosis not present

## 2017-04-06 DIAGNOSIS — I132 Hypertensive heart and chronic kidney disease with heart failure and with stage 5 chronic kidney disease, or end stage renal disease: Secondary | ICD-10-CM | POA: Diagnosis present

## 2017-04-06 DIAGNOSIS — N2581 Secondary hyperparathyroidism of renal origin: Secondary | ICD-10-CM | POA: Diagnosis present

## 2017-04-06 DIAGNOSIS — Z6841 Body Mass Index (BMI) 40.0 and over, adult: Secondary | ICD-10-CM | POA: Diagnosis not present

## 2017-04-06 DIAGNOSIS — K449 Diaphragmatic hernia without obstruction or gangrene: Secondary | ICD-10-CM | POA: Diagnosis present

## 2017-04-06 DIAGNOSIS — R011 Cardiac murmur, unspecified: Secondary | ICD-10-CM | POA: Diagnosis present

## 2017-04-06 LAB — GLUCOSE, CAPILLARY
Glucose-Capillary: 102 mg/dL — ABNORMAL HIGH (ref 65–99)
Glucose-Capillary: 181 mg/dL — ABNORMAL HIGH (ref 65–99)
Glucose-Capillary: 109 mg/dL — ABNORMAL HIGH (ref 65–99)
Glucose-Capillary: 178 mg/dL — ABNORMAL HIGH (ref 65–99)

## 2017-04-06 LAB — CBC
HCT: 30.6 % — ABNORMAL LOW (ref 36.0–46.0)
Hemoglobin: 9.9 g/dL — ABNORMAL LOW (ref 12.0–15.0)
MCH: 27.3 pg (ref 26.0–34.0)
MCHC: 32.4 g/dL (ref 30.0–36.0)
MCV: 84.5 fL (ref 78.0–100.0)
PLATELETS: 187 10*3/uL (ref 150–400)
RBC: 3.62 MIL/uL — ABNORMAL LOW (ref 3.87–5.11)
RDW: 20.6 % — AB (ref 11.5–15.5)
WBC: 7.2 10*3/uL (ref 4.0–10.5)

## 2017-04-06 LAB — HEPARIN LEVEL (UNFRACTIONATED)
Heparin Unfractionated: 0.23 IU/mL — ABNORMAL LOW (ref 0.30–0.70)
Heparin Unfractionated: 0.5 IU/mL (ref 0.30–0.70)

## 2017-04-06 LAB — ECHOCARDIOGRAM COMPLETE
HEIGHTINCHES: 66 in
WEIGHTICAEL: 4380.98 [oz_av]

## 2017-04-06 LAB — PROTIME-INR
INR: 1.12
Prothrombin Time: 14.3 seconds (ref 11.4–15.2)

## 2017-04-06 MED ORDER — SODIUM CHLORIDE 0.9 % IV SOLN
INTRAVENOUS | Status: DC
  Administered 2017-04-07: 07:00:00 via INTRAVENOUS

## 2017-04-06 MED ORDER — SODIUM CHLORIDE 0.9 % IV SOLN: 250.0000 mL | INTRAVENOUS | Status: DC | PRN

## 2017-04-06 MED ORDER — CINACALCET HCL 30 MG PO TABS
90.0000 mg | ORAL_TABLET | Freq: Every day | ORAL | Status: DC
  Administered 2017-04-06 – 2017-04-09 (×4): 90 mg via ORAL
  Filled 2017-04-06 (×4): qty 3

## 2017-04-06 MED ORDER — SODIUM CHLORIDE 0.9% FLUSH: 3.0000 mL | INTRAVENOUS | Status: DC | PRN

## 2017-04-06 MED ORDER — DARBEPOETIN ALFA 150 MCG/0.3ML IJ SOSY
150.0000 ug | PREFILLED_SYRINGE | INTRAMUSCULAR | Status: DC
  Administered 2017-04-09: 150 ug via INTRAVENOUS
  Filled 2017-04-06: qty 0.3

## 2017-04-06 MED ORDER — CALCITRIOL 0.5 MCG PO CAPS
0.5000 ug | ORAL_CAPSULE | ORAL | Status: DC
  Administered 2017-04-07 – 2017-04-09 (×2): 0.5 ug via ORAL

## 2017-04-06 MED ORDER — SODIUM CHLORIDE 0.9 % IV SOLN
125.0000 mg | INTRAVENOUS | Status: AC
  Administered 2017-04-07: 125 mg via INTRAVENOUS
  Filled 2017-04-06 (×2): qty 10

## 2017-04-06 MED ORDER — SODIUM CHLORIDE 0.9% FLUSH: 3.0000 mL | Freq: Two times a day (BID) | INTRAVENOUS | Status: DC

## 2017-04-06 NOTE — Progress Notes (Signed)
Ref: [name], MD   Subjective:  Shortness of breath persist. Abnormal nuclear stress test with very low EF. She had good LV systolic function in past- EF 55 to 60 % in 09/2016.  Objective:  Vital Signs in the last 24 hours: Temp:  [97.9 F (36.6 C)-98.7 F (37.1 C)] 97.9 F (36.6 C) (03/18 0454) Pulse Rate:  [68-71] 71 (03/18 0931) Cardiac Rhythm: Normal sinus rhythm;Heart block (03/18 0701) Resp:  [18-20] 18 (03/17 2050) BP: (97-114)/(45-67) 114/45 (03/18 0454) SpO2:  [95 %-98 %] 98 % (03/18 0454) Weight:  [124.2 kg (273 lb 13 oz)] 124.2 kg (273 lb 13 oz) (03/18 0454)  Physical Exam: BP Readings from Last 1 Encounters:  04/06/17 (!) 114/45     Wt Readings from Last 1 Encounters:  04/06/17 124.2 kg (273 lb 13 oz)    Weight change: -0.3 kg (-10.6 oz) Body mass index is 44.19 kg/m. HEENT: [place]/AT, Eyes-Brown, PERL, EOMI, Conjunctiva-Pink, Sclera-Non-icteric Neck: No JVD, No bruit, Trachea midline. Lungs:  Clear, Bilateral. Cardiac:  Regular rhythm, normal S1 and S2, no S3. II/VI systolic murmur. Abdomen:  Soft, non-tender. BS present. Extremities:  No edema present. No cyanosis. No clubbing. CNS: AxOx3, Cranial nerves grossly intact, moves all 4 extremities.  Skin: Warm and dry.   Intake/Output from previous day: 03/17 0701 - 03/18 0700 In: 600 [P.O.:600] Out: -     Lab Results: BMET    Component Value Date/Time   NA 133 (L) 04/04/2017 0613   NA 136 04/03/2017 0708   NA 136 04/02/2017 1339   K 4.8 04/04/2017 0613   K 5.0 04/03/2017 0708   K 4.7 04/02/2017 1339   CL 94 (L) 04/04/2017 0613   CL 97 (L) 04/03/2017 0708   CL 97 (L) 04/02/2017 1339   CO2 22 04/04/2017 0613   CO2 24 04/03/2017 0708   CO2 25 04/02/2017 1339   GLUCOSE 97 04/04/2017 0613   GLUCOSE 89 04/03/2017 0708   GLUCOSE 118 (H) 04/02/2017 1339   BUN 65 (H) 04/04/2017 0613   BUN 38 (H) 04/03/2017 0708   BUN 24 (H) 04/02/2017 1339   CREATININE 9.35 (H) 04/04/2017 0613   CREATININE 7.35  (H) 04/03/2017 0708   CREATININE 5.14 (H) 04/02/2017 1339   CREATININE 2.86 (H) 10/10/2010 1140   CREATININE 2.20 (H) 06/26/2010 1637   CALCIUM 8.3 (L) 04/04/2017 0613   CALCIUM 8.9 04/03/2017 0708   CALCIUM 8.0 (L) 04/02/2017 1339   CALCIUM 8.7 01/08/2010 1121   CALCIUM 9.1 10/08/2009 0922   CALCIUM 8.6 07/30/2009 1102   GFRNONAA 4 (L) 04/04/2017 0613   GFRNONAA 5 (L) 04/03/2017 0708   GFRNONAA 8 (L) 04/02/2017 1339   GFRAA 4 (L) 04/04/2017 0613   GFRAA 6 (L) 04/03/2017 0708   GFRAA 9 (L) 04/02/2017 1339   CBC    Component Value Date/Time   WBC 7.2 04/06/2017 0303   RBC 3.62 (L) 04/06/2017 0303   HGB 9.9 (L) 04/06/2017 0303   HCT 30.6 (L) 04/06/2017 0303   PLT 187 04/06/2017 0303   MCV 84.5 04/06/2017 0303   MCH 27.3 04/06/2017 0303   MCHC 32.4 04/06/2017 0303   RDW 20.6 (H) 04/06/2017 0303   LYMPHSABS 1.6 10/21/2016 0343   MONOABS 0.7 10/21/2016 0343   EOSABS 0.0 10/21/2016 0343   BASOSABS 0.0 10/21/2016 0343   HEPATIC Function Panel Recent Labs    10/14/16 0326  PROT 6.8   HEMOGLOBIN A1C No components found for: HGA1C,  MPG CARDIAC ENZYMES Lab Results  Component Value Date   CKTOTAL 87 04/13/2010   CKMB 1.0 04/13/2010   TROPONINI <0.03 04/03/2017   TROPONINI <0.03 04/02/2017   TROPONINI <0.03 04/02/2017   BNP No results for input(s): PROBNP in the last 8760 hours. TSH No results for input(s): TSH in the last 8760 hours. CHOLESTEROL Recent Labs    04/03/17 0709  CHOL 79    Scheduled Meds: . albuterol  2.5 mg Nebulization TID  . aspirin EC  81 mg Oral Daily  . atorvastatin  40 mg Oral QHS  . clopidogrel  75 mg Oral Daily  . insulin aspart  0-9 Units Subcutaneous TID WC  . metoprolol tartrate  12.5 mg Oral BID  . nitroGLYCERIN  0.5 inch Topical Q8H  . sevelamer carbonate  800 mg Oral TID WC  . tiotropium  18 mcg Inhalation Daily   Continuous Infusions: . heparin 1,650 Units/hr (04/06/17 0931)   PRN Meds:.nitroGLYCERIN, ondansetron (ZOFRAN)  IV  Assessment/Plan: Acute coronary syndrome Shortness of breath ESRD Hypertension CAD Type 2 DM COPD Morbid Obesity Anemia of chronic disease Blindness of right eye  Echocardiogram for LV function.   LOS: 0 days    Dixie Dials  MD  04/06/2017, 9:37 AM

## 2017-04-06 NOTE — Progress Notes (Signed)
ANTICOAGULATION CONSULT NOTE  Pharmacy Consult:  Heparin Indication: chest pain/ACS  Allergies  Allergen Reactions  . Lyrica [Pregabalin] Other (See Comments)    Hallucinations   Patient Measurements: Height: 5\' 6"  (167.6 cm) Weight: 273 lb 13 oz (124.2 kg) IBW/kg (Calculated) : 59.3 Heparin Dosing Weight: 89 kg  Vital Signs: Temp: 97.6 F (36.4 C) (03/18 1414) Temp Source: Oral (03/18 1414) BP: 104/53 (03/18 1414) Pulse Rate: 69 (03/18 1414)  Labs: Recent Labs    04/04/17 0449 04/04/17 9758  04/05/17 0356 04/06/17 0303 04/06/17 1304  HGB 9.9*  --   --  9.5* 9.9*  --   HCT 31.2*  --   --  30.9* 30.6*  --   PLT 212  --   --  216 187  --   LABPROT  --   --   --   --  14.3  --   INR  --   --   --   --  1.12  --   HEPARINUNFRC 0.30  --    < > 0.34 0.23* 0.50  CREATININE  --  9.35*  --   --   --   --    < > = values in this interval not displayed.   Estimated Creatinine Clearance: 7.2 mL/min (A) (by C-G formula based on SCr of 9.35 mg/dL (H)).  Assessment: 72 y.o. female with chest pain, awaiting possible cath tomorrow 04/07/17, continues on IV heparin.  Heparin level now 0.5 on heparin 1650 units/hr. Therapeutic level. No bleeding noted.   Goal of Therapy:  Heparin level 0.3-0.7 units/ml Monitor platelets by anticoagulation protocol: Yes   Plan:  Continue IV Heparin 1650 units/hr Daily Heparin level and CBC  Nicole Cella, [place] Clinical Pharmacist Pager: [phone] or [phone] (330p-1030p) Main Rx [phone] 04/06/2017, 3:14 PM

## 2017-04-06 NOTE — Consult Note (Signed)
   [facility] CM Inpatient Consult   04/06/2017  [name] [birth year] [id number]  Patient screened for potential [place] Management services. Patient is in the [place] [facility] [place] Management services under patient's [facility] plan. Chart reveals the patient admitted with chest pain with shortness of breath. Patient is currently scheduled for a heart cath on 04/07/17.  Spoke with inpatient RNCM, [name] that patient is eligible for [facility] Care Management services. Per inpatient RNCM patient has HD on T,TH,Sat schedule.  She has an Engineer, production (personal care services) 5 days a week for 2 hours a day. She also uses [employer] for transportation.   Patient has a History with [facility] Care Management. Will follow for progression and disposition needs.   For questions contact:   Natividad Brood, RN BSN [facility] Liaison  [phone] business mobile phone Toll free office [phone]

## 2017-04-06 NOTE — Plan of Care (Signed)
  Cardiac: Ability to achieve and maintain adequate cardiovascular perfusion will improve 04/06/2017 2323 - Progressing by Jacqlyn Larsen, RN Note Plan for a heart cath 3/19. IV heparin infusing. No c/o CP. Nitro paste in place. NPO at midnight.

## 2017-04-06 NOTE — H&P (View-Only) (Signed)
Ref: Tiffany Hatchet, MD   Subjective:  Shortness of breath persist. Abnormal nuclear stress test with very low EF. She had good LV systolic function in past- EF 55 to 60 % in 09/2016.  Objective:  Vital Signs in the last 24 hours: Temp:  [97.9 F (36.6 C)-98.7 F (37.1 C)] 97.9 F (36.6 C) (03/18 0454) Pulse Rate:  [68-71] 71 (03/18 0931) Cardiac Rhythm: Normal sinus rhythm;Heart block (03/18 0701) Resp:  [18-20] 18 (03/17 2050) BP: (97-114)/(45-67) 114/45 (03/18 0454) SpO2:  [95 %-98 %] 98 % (03/18 0454) Weight:  [124.2 kg (273 lb 13 oz)] 124.2 kg (273 lb 13 oz) (03/18 0454)  Physical Exam: BP Readings from Last 1 Encounters:  04/06/17 (!) 114/45     Wt Readings from Last 1 Encounters:  04/06/17 124.2 kg (273 lb 13 oz)    Weight change: -0.3 kg (-10.6 oz) Body mass index is 44.19 kg/m. HEENT: [place]/AT, Eyes-Brown, PERL, EOMI, Conjunctiva-Pink, Sclera-Non-icteric Neck: No JVD, No bruit, Trachea midline. Lungs:  Clear, Bilateral. Cardiac:  Regular rhythm, normal S1 and S2, no S3. II/VI systolic murmur. Abdomen:  Soft, non-tender. BS present. Extremities:  No edema present. No cyanosis. No clubbing. CNS: AxOx3, Cranial nerves grossly intact, moves all 4 extremities.  Skin: Warm and dry.   Intake/Output from previous day: 03/17 0701 - 03/18 0700 In: 600 [P.O.:600] Out: -     Lab Results: BMET    Component Value Date/Time   NA 133 (L) 04/04/2017 0613   NA 136 04/03/2017 0708   NA 136 04/02/2017 1339   K 4.8 04/04/2017 0613   K 5.0 04/03/2017 0708   K 4.7 04/02/2017 1339   CL 94 (L) 04/04/2017 0613   CL 97 (L) 04/03/2017 0708   CL 97 (L) 04/02/2017 1339   CO2 22 04/04/2017 0613   CO2 24 04/03/2017 0708   CO2 25 04/02/2017 1339   GLUCOSE 97 04/04/2017 0613   GLUCOSE 89 04/03/2017 0708   GLUCOSE 118 (H) 04/02/2017 1339   BUN 65 (H) 04/04/2017 0613   BUN 38 (H) 04/03/2017 0708   BUN 24 (H) 04/02/2017 1339   CREATININE 9.35 (H) 04/04/2017 0613   CREATININE 7.35  (H) 04/03/2017 0708   CREATININE 5.14 (H) 04/02/2017 1339   CREATININE 2.86 (H) 10/10/2010 1140   CREATININE 2.20 (H) 06/26/2010 1637   CALCIUM 8.3 (L) 04/04/2017 0613   CALCIUM 8.9 04/03/2017 0708   CALCIUM 8.0 (L) 04/02/2017 1339   CALCIUM 8.7 01/08/2010 1121   CALCIUM 9.1 10/08/2009 0922   CALCIUM 8.6 07/30/2009 1102   GFRNONAA 4 (L) 04/04/2017 0613   GFRNONAA 5 (L) 04/03/2017 0708   GFRNONAA 8 (L) 04/02/2017 1339   GFRAA 4 (L) 04/04/2017 0613   GFRAA 6 (L) 04/03/2017 0708   GFRAA 9 (L) 04/02/2017 1339   CBC    Component Value Date/Time   WBC 7.2 04/06/2017 0303   RBC 3.62 (L) 04/06/2017 0303   HGB 9.9 (L) 04/06/2017 0303   HCT 30.6 (L) 04/06/2017 0303   PLT 187 04/06/2017 0303   MCV 84.5 04/06/2017 0303   MCH 27.3 04/06/2017 0303   MCHC 32.4 04/06/2017 0303   RDW 20.6 (H) 04/06/2017 0303   LYMPHSABS 1.6 10/21/2016 0343   MONOABS 0.7 10/21/2016 0343   EOSABS 0.0 10/21/2016 0343   BASOSABS 0.0 10/21/2016 0343   HEPATIC Function Panel Recent Labs    10/14/16 0326  PROT 6.8   HEMOGLOBIN A1C No components found for: HGA1C,  MPG CARDIAC ENZYMES Lab Results  Component Value Date   CKTOTAL 87 04/13/2010   CKMB 1.0 04/13/2010   TROPONINI <0.03 04/03/2017   TROPONINI <0.03 04/02/2017   TROPONINI <0.03 04/02/2017   BNP No results for input(s): PROBNP in the last 8760 hours. TSH No results for input(s): TSH in the last 8760 hours. CHOLESTEROL Recent Labs    04/03/17 0709  CHOL 79    Scheduled Meds: . albuterol  2.5 mg Nebulization TID  . aspirin EC  81 mg Oral Daily  . atorvastatin  40 mg Oral QHS  . clopidogrel  75 mg Oral Daily  . insulin aspart  0-9 Units Subcutaneous TID WC  . metoprolol tartrate  12.5 mg Oral BID  . nitroGLYCERIN  0.5 inch Topical Q8H  . sevelamer carbonate  800 mg Oral TID WC  . tiotropium  18 mcg Inhalation Daily   Continuous Infusions: . heparin 1,650 Units/hr (04/06/17 0931)   PRN Meds:.nitroGLYCERIN, ondansetron (ZOFRAN)  IV  Assessment/Plan: Acute coronary syndrome Shortness of breath ESRD Hypertension CAD Type 2 DM COPD Morbid Obesity Anemia of chronic disease Blindness of right eye  Echocardiogram for LV function.   LOS: 0 days    Dixie Dials  MD  04/06/2017, 9:37 AM

## 2017-04-06 NOTE — Progress Notes (Addendum)
[facility] Progress Note   Dialysis Orders: [facility] 4[facility]5h 180 NRe BFR 450/800 EDW 124 2K/2Ca Hep 13000 Units IV TIW left IJ TDC and left AVF - using 1:1 Venofer 100mg  IV x5 (4/5 dosed) Mircera 136mcg IV q 2weeks (last 3/7) Calcitriol 0.5 mcg PO TIW(takes home sensipar 90 per day) **had been leaving below EDW prior to admission**  Assessment/Plan: 1. Chest pain- Positive Nuc stress, low EF 18%. For left heart cath by Dr. Doylene Canard later this week 2. ESRD - TTS. Next HD 04/07/17. Has been using one needle in AVF and port of TDC.  3. Hypertension/volume - BP on soft side, asyptomatic. HD 04/04/17 Net UF 3 L post wt 121.5 kg. Now under EDW. Lower EDW on DC.- check orthostatics 4. Anemia - Hgb 9.9 - ESA redose due 3/21- and will give last dose of Fe to complete course 5. Metabolic bone disease - On Renvela binder  - resumed calcitriol and sensipar 90 daily 6. Nutrition: Albumin 3.1 Renal diet. Renal vit 7.   DM per primary  Myriam Jacobson, PA-C [place] [phone] 04/06/2017,9:43 AM  LOS: 0 days   Pt seen, examined and agree w A/P as above.  Kelly Splinter MD [facility] pager [phone]   04/06/2017, 4:36 PM    Subjective:   Eating a sausage biscuit her niece brought her.Lives alone. Ambulates without difficulty.  Objective Vitals:   04/05/17 2113 04/05/17 2231 04/06/17 0454 04/06/17 0931  BP:  104/67 (!) 114/45   Pulse:  68 71 71  Resp:      Temp:   97.9 F (36.6 C)   TempSrc:   Oral   SpO2: 95%  98%   Weight:   124.2 kg (273 lb 13 oz)   Height:       Physical Exam General: obese NAD sitting in chair - eating breakfast Heart: RRR Lungs: crackles at bases Abdomen: obese soft NT + BS Extremities: no edema Dialysis Access:  Left IJ TDC and left upper AVF + bruit   Additional Objective Labs: Basic Metabolic Panel: Recent Labs  Lab 04/02/17 1339 04/03/17 0708 04/04/17 0613  NA 136 136 133*  K 4.7 5.0  4.8  CL 97* 97* 94*  CO2 25 24 22   GLUCOSE 118* 89 97  BUN 24* 38* 65*  CREATININE 5.14* 7.35* 9.35*  CALCIUM 8.0* 8.9 8.3*  PHOS  --   --  8.0*   Liver Function Tests: Recent Labs  Lab 04/04/17 0613  ALBUMIN 3.1*   No results for input(s): LIPASE, AMYLASE in the last 168 hours. CBC: Recent Labs  Lab 04/02/17 1339 04/03/17 0708 04/04/17 0449 04/05/17 0356 04/06/17 0303  WBC 8.0 7.0 7.8 7.8 7.2  HGB 10.5* 10.1* 9.9* 9.5* 9.9*  HCT 34.0* 32.6* 31.2* 30.9* 30.6*  MCV 86.3 86.7 85.5 84.7 84.5  PLT 228 226 212 216 187   Blood Culture    Component Value Date/Time   SDES BLOOD RIGHT ANTECUBITAL 02/01/2016 0839   SPECREQUEST BOTTLES DRAWN AEROBIC AND ANAEROBIC 5CC 02/01/2016 0839   CULT NO GROWTH 5 DAYS 02/01/2016 0839   REPTSTATUS 02/06/2016 FINAL 02/01/2016 0839    Cardiac Enzymes: Recent Labs  Lab 04/02/17 2114 04/02/17 2349 04/03/17 0708  TROPONINI <0.03 <0.03 <0.03   CBG: Recent Labs  Lab 04/05/17 0741 04/05/17 1149 04/05/17 1622 04/05/17 2049 04/06/17 0725  GLUCAP 96 176* 82 178* 102*   Iron Studies: No results for input(s): IRON, TIBC, TRANSFERRIN, FERRITIN in the last 72  hours. Lab Results  Component Value Date   INR 1.12 04/06/2017   INR 1.10 07/28/2015   INR 1.16 12/04/2014   Studies/Results: Nm Myocar Multi W/spect W/wall Motion / Ef  Result Date: 04/04/2017 CLINICAL DATA:  Chest pain EXAM: MYOCARDIAL IMAGING WITH SPECT (REST AND PHARMACOLOGIC-STRESS) GATED LEFT VENTRICULAR WALL MOTION STUDY LEFT VENTRICULAR EJECTION FRACTION TECHNIQUE: Standard myocardial SPECT imaging was performed after resting intravenous injection of 10 mCi Tc-67m tetrofosmin. Subsequently, intravenous infusion of Lexiscan was performed under the supervision of the Cardiology staff. At peak effect of the drug, 30 mCi Tc-25m tetrofosmin was injected intravenously and standard myocardial SPECT imaging was performed. Quantitative gated imaging was also performed to evaluate left  ventricular wall motion, and estimate left ventricular ejection fraction. COMPARISON:  None. FINDINGS: Perfusion: Small apical and inferolateral defects or larger on the stress imaging compared to rest concerning for areas of inducible ischemia with pharmacologic stress. Wall Motion: Mild LV chamber dilatation.  Global hypokinesis. Left Ventricular Ejection Fraction: 18 % End diastolic volume 92 ml End systolic volume 75 ml IMPRESSION: 1. Positive exam for apical and inferolateral areas of inducible ischemia with pharmacologic stress. 2. Mild LV chamber dilatation and global hypokinesis. 3. Left ventricular ejection fraction 18% 4. Non invasive risk stratification*: High *2012 Appropriate Use Criteria for Coronary Revascularization Focused Update: J Am Coll Cardiol. 8832;54(9):826-415. http://content.airportbarriers.com.aspx?articleid=1201161 Electronically Signed   By: Jerilynn Mages.  Shick M.D.   On: 04/04/2017 15:20   Medications: . heparin 1,650 Units/hr (04/06/17 0931)   . albuterol  2.5 mg Nebulization TID  . aspirin EC  81 mg Oral Daily  . atorvastatin  40 mg Oral QHS  . clopidogrel  75 mg Oral Daily  . insulin aspart  0-9 Units Subcutaneous TID WC  . metoprolol tartrate  12.5 mg Oral BID  . nitroGLYCERIN  0.5 inch Topical Q8H  . sevelamer carbonate  800 mg Oral TID WC  . tiotropium  18 mcg Inhalation Daily

## 2017-04-06 NOTE — Progress Notes (Signed)
  Echocardiogram 2D Echocardiogram has been performed.  Addaline Peplinski T Jeray Shugart 04/06/2017, 3:31 PM

## 2017-04-06 NOTE — Progress Notes (Signed)
ANTICOAGULATION CONSULT NOTE  Pharmacy Consult:  Heparin Indication: chest pain/ACS  Allergies  Allergen Reactions  . Lyrica [Pregabalin] Other (See Comments)    Hallucinations   Patient Measurements: Height: 5\' 6"  (167.6 cm) Weight: 269 lb 12.8 oz (122.4 kg) IBW/kg (Calculated) : 59.3 Heparin Dosing Weight: 89 kg  Vital Signs: Temp: 98.2 F (36.8 C) (03/17 2050) Temp Source: Oral (03/17 2050) BP: 104/67 (03/17 2231) Pulse Rate: 68 (03/17 2231)  Labs: Recent Labs    04/03/17 0708  04/04/17 0449 04/04/17 0613 04/04/17 1625 04/05/17 0356 04/06/17 0303  HGB 10.1*  --  9.9*  --   --  9.5* 9.9*  HCT 32.6*  --  31.2*  --   --  30.9* 30.6*  PLT 226  --  212  --   --  216 187  LABPROT  --   --   --   --   --   --  14.3  INR  --   --   --   --   --   --  1.12  HEPARINUNFRC  --    < > 0.30  --  2.00* 0.34 0.23*  CREATININE 7.35*  --   --  9.35*  --   --   --   TROPONINI <0.03  --   --   --   --   --   --    < > = values in this interval not displayed.   Estimated Creatinine Clearance: 7.1 mL/min (A) (by C-G formula based on SCr of 9.35 mg/dL (H)).  Assessment: 72 y.o. female with chest pain, awaiting possible cath, for heparin  Goal of Therapy:  Heparin level 0.3-0.7 units/ml Monitor platelets by anticoagulation protocol: Yes   Plan:  Increase Heparin 1650 units/hr  Phillis Knack, PharmD, BCPS  04/06/2017, 3:56 AM

## 2017-04-07 ENCOUNTER — Encounter (HOSPITAL_COMMUNITY)
Admission: EM | Disposition: A | Discharge status: Home / Self Care | Payer: Self-pay | Source: Home / Self Care | Attending: Cardiology

## 2017-04-07 HISTORY — PX: CORONARY ANGIOGRAPHY: CATH118303

## 2017-04-07 LAB — CBC
HCT: 29 % — ABNORMAL LOW (ref 36.0–46.0)
Hemoglobin: 9.3 g/dL — ABNORMAL LOW (ref 12.0–15.0)
MCH: 26.6 pg (ref 26.0–34.0)
MCHC: 32.1 g/dL (ref 30.0–36.0)
MCV: 83.1 fL (ref 78.0–100.0)
Platelets: 201 10*3/uL (ref 150–400)
RBC: 3.49 MIL/uL — ABNORMAL LOW (ref 3.87–5.11)
RDW: 20.4 % — ABNORMAL HIGH (ref 11.5–15.5)
WBC: 7.8 10*3/uL (ref 4.0–10.5)

## 2017-04-07 LAB — BASIC METABOLIC PANEL
ANION GAP: 19 — AB (ref 5–15)
BUN: 76 mg/dL — AB (ref 6–20)
CO2: 21 mmol/L — ABNORMAL LOW (ref 22–32)
Calcium: 8.2 mg/dL — ABNORMAL LOW (ref 8.9–10.3)
Chloride: 91 mmol/L — ABNORMAL LOW (ref 101–111)
Creatinine, Ser: 11.13 mg/dL — ABNORMAL HIGH (ref 0.44–1.00)
GFR calc Af Amer: 3 mL/min — ABNORMAL LOW (ref >60)
GFR, EST NON AFRICAN AMERICAN: 3 mL/min — AB (ref >60)
GLUCOSE: 109 mg/dL — AB (ref 65–99)
POTASSIUM: 5.9 mmol/L — AB (ref 3.5–5.1)
Sodium: 131 mmol/L — ABNORMAL LOW (ref 135–145)

## 2017-04-07 LAB — GLUCOSE, CAPILLARY
GLUCOSE-CAPILLARY: 89 mg/dL (ref 65–99)
GLUCOSE-CAPILLARY: 71 mg/dL (ref 65–99)
Glucose-Capillary: 103 mg/dL — ABNORMAL HIGH (ref 65–99)
Glucose-Capillary: 84 mg/dL (ref 65–99)
Glucose-Capillary: 209 mg/dL — ABNORMAL HIGH (ref 65–99)

## 2017-04-07 LAB — POCT ACTIVATED CLOTTING TIME: Activated Clotting Time: 186 seconds

## 2017-04-07 LAB — HEPARIN LEVEL (UNFRACTIONATED): HEPARIN UNFRACTIONATED: 0.41 [IU]/mL (ref 0.30–0.70)

## 2017-04-07 SURGERY — CORONARY ANGIOGRAPHY (CATH LAB)
Anesthesia: LOCAL

## 2017-04-07 MED ORDER — SODIUM CHLORIDE 0.9 % IV SOLN: 250.0000 mL | INTRAVENOUS | Status: DC | PRN

## 2017-04-07 MED ORDER — SODIUM CHLORIDE 0.9 % IV SOLN: 100.0000 mL | INTRAVENOUS | Status: DC | PRN

## 2017-04-07 MED ORDER — FENTANYL CITRATE (PF) 100 MCG/2ML IJ SOLN
INTRAMUSCULAR | Status: DC | PRN
  Administered 2017-04-07: 25 ug via INTRAVENOUS

## 2017-04-07 MED ORDER — HEPARIN (PORCINE) IN NACL 2-0.9 UNIT/ML-% IJ SOLN
INTRAMUSCULAR | Status: AC | PRN
  Administered 2017-04-07: 500 mL via INTRA_ARTERIAL

## 2017-04-07 MED ORDER — FENTANYL CITRATE (PF) 100 MCG/2ML IJ SOLN
INTRAMUSCULAR | Status: AC
  Filled 2017-04-07: qty 2

## 2017-04-07 MED ORDER — LIDOCAINE HCL (PF) 1 % IJ SOLN: 5.0000 mL | INTRAMUSCULAR | Status: DC | PRN

## 2017-04-07 MED ORDER — LIDOCAINE HCL (PF) 1 % IJ SOLN
INTRAMUSCULAR | Status: AC
  Filled 2017-04-07: qty 30

## 2017-04-07 MED ORDER — HEPARIN SODIUM (PORCINE) 1000 UNIT/ML DIALYSIS
1000.0000 [IU] | INTRAMUSCULAR | Status: DC | PRN
  Filled 2017-04-07: qty 1

## 2017-04-07 MED ORDER — SODIUM CHLORIDE 0.9% FLUSH: 3.0000 mL | INTRAVENOUS | Status: DC | PRN

## 2017-04-07 MED ORDER — MIDAZOLAM HCL 2 MG/2ML IJ SOLN
INTRAMUSCULAR | Status: AC
  Filled 2017-04-07: qty 2

## 2017-04-07 MED ORDER — IOPAMIDOL (ISOVUE-370) INJECTION 76%
INTRAVENOUS | Status: AC
  Filled 2017-04-07: qty 100

## 2017-04-07 MED ORDER — SODIUM CHLORIDE 0.9 % IV SOLN: INTRAVENOUS | Status: DC

## 2017-04-07 MED ORDER — LIDOCAINE HCL (PF) 1 % IJ SOLN
INTRAMUSCULAR | Status: DC | PRN
  Administered 2017-04-07: 15 mL via INTRADERMAL

## 2017-04-07 MED ORDER — IOPAMIDOL (ISOVUE-370) INJECTION 76%
INTRAVENOUS | Status: DC | PRN
  Administered 2017-04-07: 30 mL via INTRA_ARTERIAL

## 2017-04-07 MED ORDER — HEPARIN (PORCINE) IN NACL 2-0.9 UNIT/ML-% IJ SOLN
INTRAMUSCULAR | Status: AC
  Filled 2017-04-07: qty 1000

## 2017-04-07 MED ORDER — ALTEPLASE 2 MG IJ SOLR: 2.0000 mg | Freq: Once | INTRAMUSCULAR | Status: DC | PRN

## 2017-04-07 MED ORDER — MIDAZOLAM HCL 2 MG/2ML IJ SOLN
INTRAMUSCULAR | Status: DC | PRN
  Administered 2017-04-07: 1 mg via INTRAVENOUS

## 2017-04-07 MED ORDER — HEPARIN SODIUM (PORCINE) 1000 UNIT/ML DIALYSIS
20.0000 [IU]/kg | INTRAMUSCULAR | Status: DC | PRN
  Filled 2017-04-07: qty 3

## 2017-04-07 MED ORDER — LIDOCAINE-PRILOCAINE 2.5-2.5 % EX CREA
1.0000 "application " | TOPICAL_CREAM | CUTANEOUS | Status: DC | PRN
  Filled 2017-04-07: qty 5

## 2017-04-07 MED ORDER — CALCITRIOL 0.5 MCG PO CAPS
ORAL_CAPSULE | ORAL | Status: AC
  Filled 2017-04-07: qty 1

## 2017-04-07 MED ORDER — PENTAFLUOROPROP-TETRAFLUOROETH EX AERO: 1.0000 "application " | INHALATION_SPRAY | CUTANEOUS | Status: DC | PRN

## 2017-04-07 MED ORDER — SODIUM CHLORIDE 0.9% FLUSH
3.0000 mL | Freq: Two times a day (BID) | INTRAVENOUS | Status: DC
  Administered 2017-04-07 – 2017-04-09 (×4): 3 mL via INTRAVENOUS

## 2017-04-07 SURGICAL SUPPLY — 7 items
CATH INFINITI 5FR MULTPACK ANG (CATHETERS) ×2 IMPLANT
HOVERMATT SINGLE USE (MISCELLANEOUS) ×2 IMPLANT
KIT HEART LEFT (KITS) ×2 IMPLANT
PACK CARDIAC CATHETERIZATION (CUSTOM PROCEDURE TRAY) ×2 IMPLANT
SHEATH AVANTI 11CM 5FR (SHEATH) ×2 IMPLANT
TRANSDUCER W/STOPCOCK (MISCELLANEOUS) ×2 IMPLANT
WIRE EMERALD 3MM-J .035X150CM (WIRE) ×2 IMPLANT

## 2017-04-07 NOTE — Progress Notes (Signed)
Site area: rt groin fa sheath Site Prior to Removal:  Level 0 Pressure Applied For:  20 minutes Manual:   yes Patient Status During Pull:  stable Post Pull Site:  Level  0 Post Pull Instructions Given:  yes Post Pull Pulses Present:  palpable Dressing Applied:  Gauze and tegaderm Bedrest begins @ 1640 Comments:

## 2017-04-07 NOTE — Progress Notes (Signed)
ANTICOAGULATION CONSULT NOTE  Pharmacy Consult:  Heparin Indication: chest pain/ACS  Allergies  Allergen Reactions  . Lyrica [Pregabalin] Other (See Comments)    Hallucinations   Patient Measurements: Height: 5\' 6"  (167.6 cm) Weight: 278 lb (126.1 kg)(standing weight) IBW/kg (Calculated) : 59.3 Heparin Dosing Weight: 89 kg  Vital Signs: Temp: 97.8 F (36.6 C) (03/19 0749) Temp Source: Oral (03/19 0749) BP: 88/50 (03/19 1000) Pulse Rate: 66 (03/19 1000)  Labs: Recent Labs    04/05/17 0356 04/06/17 0303 04/06/17 1304 04/07/17 0403  HGB 9.5* 9.9*  --  9.3*  HCT 30.9* 30.6*  --  29.0*  PLT 216 187  --  201  LABPROT  --  14.3  --   --   INR  --  1.12  --   --   HEPARINUNFRC 0.34 0.23* 0.50 0.41  CREATININE  --   --   --  11.13*   Estimated Creatinine Clearance: 6.1 mL/min (A) (by C-G formula based on SCr of 11.13 mg/dL (H)).  Assessment: 72 y.o. female with chest pain/ACS. Heparin level this AM =  0.41, therapeutic on heparin 1650 units/hr, no bleeding noted.  Hgb low stable, pltc WNL.  Abnl nuclear stress test with very low EF 18% (down from EF 55-60-% in 09/2016) -plan for left heart cath today 04/07/17 @ 15:30  PTA patient was not on an anticoagulant, only ASA EC 81 mg daily.    Goal of Therapy:  Heparin level 0.3-0.7 units/ml Monitor platelets by anticoagulation protocol: Yes   Plan:  Continue IV Heparin 1650 units/hr Daily Heparin level and CBC F/u after cath today  Nicole Cella, [place] Pharmacist Pager: [phone] or [phone] (330p-1030p) Main Rx [phone] 04/07/2017, 10:31 AM

## 2017-04-07 NOTE — Progress Notes (Addendum)
[facility] Progress Note   D[facility]: [facility] 4[facility]5h   450/800  124kg  2/2 bath  Hep 13000  L IJ TDC/ L AVF (using 1:1) Venofer 100mg  IV x5 (4/5 dosed) Mircera 172mcg IV q 2weeks (last 3/7) Calcitriol 0.5 mcg PO TIW(takes home sensipar 90 per day) **had been leaving below EDW prior to admission**  Assessment/Plan: 1. Chest pain-Positive Nuc stress, low EF 18%.  Possible left heart cath by Dr. Doylene Canard later this week 2. ESRD - hyperkalemic today at 5.9- TTS.suspect K up in part due to running low BFR - today using catheter only at Qb 400 for better kinetics - resume 1: 1 next tmt if K is better 3. Hypertension/volume -goal today 3.5 BP stable . HD 04/04/17 Net UF 3 L post wt 121.5 kg. Pre HD wt 126.1  4. Anemia - Hgb 9.3 - ESA redose due 3/21-  last dose of Fe given  to complete course 5. Metabolic bone disease - On Renvela binder  - resumed calcitriol and sensipar 90 daily 6. Nutrition: Albumin 3.1  Myriam Jacobson, PA-C [facility] [place] [phone] 04/07/2017,8:24 AM  LOS: 1 day    Pt seen, examined and agree w A/P as above.  Kelly Splinter MD [employer] pager [phone]   04/07/2017, 2:12 PM     Subjective:   No c/o on HD, had some CP yesterday  Objective Vitals:   04/06/17 2116 04/06/17 2122 04/06/17 2208 04/07/17 0526  BP: (!) 80/69 (!) 110/55 (!) 129/43 (!) 97/42  Pulse: 74  73 71  Resp:      Temp:   97.9 F (36.6 C) 98.2 F (36.8 C)  TempSrc:   Oral Oral  SpO2:   100% 98%  Weight:    126.3 kg (278 lb 6.4 oz)  Height:       Physical Exam General: sleeping on HD - wakens easily Heart: RRR Lungs: no rales anteriorly Abdomen: obese soft NT Extremities: no sig LE edema Dialysis Access:  Left IJ TDC at 400 and left upper AVF + bruit   Additional Objective Labs: Basic Metabolic Panel: Recent Labs  Lab 04/03/17 0708 04/04/17 0613 04/07/17 0403  NA 136 133* 131*  K 5.0 4.8 5.9*  CL 97* 94* 91*  CO2  24 22 21*  GLUCOSE 89 97 109*  BUN 38* 65* 76*  CREATININE 7.35* 9.35* 11.13*  CALCIUM 8.9 8.3* 8.2*  PHOS  --  8.0*  --    Liver Function Tests: Recent Labs  Lab 04/04/17 0613  ALBUMIN 3.1*   No results for input(s): LIPASE, AMYLASE in the last 168 hours. CBC: Recent Labs  Lab 04/03/17 0708 04/04/17 0449 04/05/17 0356 04/06/17 0303 04/07/17 0403  WBC 7.0 7.8 7.8 7.2 7.8  HGB 10.1* 9.9* 9.5* 9.9* 9.3*  HCT 32.6* 31.2* 30.9* 30.6* 29.0*  MCV 86.7 85.5 84.7 84.5 83.1  PLT 226 212 216 187 201   Blood Culture    Component Value Date/Time   SDES BLOOD RIGHT ANTECUBITAL 02/01/2016 0839   SPECREQUEST BOTTLES DRAWN AEROBIC AND ANAEROBIC 5CC 02/01/2016 0839   CULT NO GROWTH 5 DAYS 02/01/2016 0839   REPTSTATUS 02/06/2016 FINAL 02/01/2016 0839    Cardiac Enzymes: Recent Labs  Lab 04/02/17 2114 04/02/17 2349 04/03/17 0708  TROPONINI <0.03 <0.03 <0.03   CBG: Recent Labs  Lab 04/06/17 0725 04/06/17 1142 04/06/17 1635 04/06/17 2207 04/07/17 0728  GLUCAP 102* 181* 109* 178* 103*   Iron Studies: No results for input(s): IRON, TIBC,  TRANSFERRIN, FERRITIN in the last 72 hours. Lab Results  Component Value Date   INR 1.12 04/06/2017   INR 1.10 07/28/2015   INR 1.16 12/04/2014   Studies/Results: No results found. Medications: . sodium chloride    . sodium chloride 10 mL/hr at 04/07/17 0636  . ferric gluconate (FERRLECIT/NULECIT) IV    . heparin 1,650 Units/hr (04/06/17 2356)   . albuterol  2.5 mg Nebulization TID  . aspirin EC  81 mg Oral Daily  . atorvastatin  40 mg Oral QHS  . calcitRIOL  0.5 mcg Oral Q T,Th,Sa-HD  . cinacalcet  90 mg Oral Q supper  . clopidogrel  75 mg Oral Daily  . [START ON 04/09/2017] darbepoetin (ARANESP) injection - DIALYSIS  150 mcg Intravenous Q Thu-HD  . insulin aspart  0-9 Units Subcutaneous TID WC  . metoprolol tartrate  12.5 mg Oral BID  . nitroGLYCERIN  0.5 inch Topical Q8H  . sevelamer carbonate  800 mg Oral TID WC  . sodium  chloride flush  3 mL Intravenous Q12H  . tiotropium  18 mcg Inhalation Daily

## 2017-04-07 NOTE — Interval H&P Note (Signed)
History and Physical Interval Note:  04/07/2017 3:26 PM  [name]  has presented today for surgery, with the diagnosis of cp  The various methods of treatment have been discussed with the patient and family. After consideration of risks, benefits and other options for treatment, the patient has consented to  Procedure(s): LEFT HEART CATH AND CORONARY ANGIOGRAPHY (N/A) as a surgical intervention .  The patient's history has been reviewed, patient examined, no change in status, stable for surgery.  I have reviewed the patient's chart and labs.  Questions were answered to the patient's satisfaction.     Birdie Riddle

## 2017-04-07 NOTE — Progress Notes (Signed)
CRITICAL VALUE ALERT  Critical Value:  CBGs 209  Date & Time Notied:  3/19 9:09 pm  Provider Notified: MD on call for cardiology  Orders Received/Actions taken:  pending

## 2017-04-08 ENCOUNTER — Encounter (HOSPITAL_COMMUNITY): Payer: Self-pay | Admitting: Cardiovascular Disease

## 2017-04-08 LAB — RENAL FUNCTION PANEL
Albumin: 3.1 g/dL — ABNORMAL LOW (ref 3.5–5.0)
Anion gap: 16 — ABNORMAL HIGH (ref 5–15)
BUN: 48 mg/dL — ABNORMAL HIGH (ref 6–20)
CO2: 23 mmol/L (ref 22–32)
Calcium: 8.6 mg/dL — ABNORMAL LOW (ref 8.9–10.3)
Chloride: 92 mmol/L — ABNORMAL LOW (ref 101–111)
Creatinine, Ser: 8.29 mg/dL — ABNORMAL HIGH (ref 0.44–1.00)
GFR calc Af Amer: 5 mL/min — ABNORMAL LOW (ref >60)
GFR calc non Af Amer: 4 mL/min — ABNORMAL LOW (ref >60)
Glucose, Bld: 128 mg/dL — ABNORMAL HIGH (ref 65–99)
Phosphorus: 7.5 mg/dL — ABNORMAL HIGH (ref 2.5–4.6)
Potassium: 4.7 mmol/L (ref 3.5–5.1)
Sodium: 131 mmol/L — ABNORMAL LOW (ref 135–145)

## 2017-04-08 LAB — CBC
HCT: 31 % — ABNORMAL LOW (ref 36.0–46.0)
HCT: 32.1 % — ABNORMAL LOW (ref 36.0–46.0)
Hemoglobin: 9.8 g/dL — ABNORMAL LOW (ref 12.0–15.0)
Hemoglobin: 10.2 g/dL — ABNORMAL LOW (ref 12.0–15.0)
MCH: 27 pg (ref 26.0–34.0)
MCH: 27.1 pg (ref 26.0–34.0)
MCHC: 31.6 g/dL (ref 30.0–36.0)
MCHC: 31.8 g/dL (ref 30.0–36.0)
MCV: 85.4 fL (ref 78.0–100.0)
MCV: 85.1 fL (ref 78.0–100.0)
PLATELETS: 192 10*3/uL (ref 150–400)
Platelets: 191 10*3/uL (ref 150–400)
RBC: 3.63 MIL/uL — AB (ref 3.87–5.11)
RBC: 3.77 MIL/uL — ABNORMAL LOW (ref 3.87–5.11)
RDW: 20.9 % — ABNORMAL HIGH (ref 11.5–15.5)
RDW: 20.7 % — ABNORMAL HIGH (ref 11.5–15.5)
WBC: 6.9 10*3/uL (ref 4.0–10.5)
WBC: 7.3 10*3/uL (ref 4.0–10.5)

## 2017-04-08 LAB — GLUCOSE, CAPILLARY
GLUCOSE-CAPILLARY: 136 mg/dL — AB (ref 65–99)
GLUCOSE-CAPILLARY: 119 mg/dL — AB (ref 65–99)
Glucose-Capillary: 112 mg/dL — ABNORMAL HIGH (ref 65–99)
Glucose-Capillary: 121 mg/dL — ABNORMAL HIGH (ref 65–99)

## 2017-04-08 LAB — BASIC METABOLIC PANEL
Anion gap: 14 (ref 5–15)
BUN: 35 mg/dL — AB (ref 6–20)
CHLORIDE: 94 mmol/L — AB (ref 101–111)
CO2: 23 mmol/L (ref 22–32)
CREATININE: 6.75 mg/dL — AB (ref 0.44–1.00)
Calcium: 8.7 mg/dL — ABNORMAL LOW (ref 8.9–10.3)
GFR calc Af Amer: 6 mL/min — ABNORMAL LOW (ref >60)
GFR calc non Af Amer: 5 mL/min — ABNORMAL LOW (ref >60)
GLUCOSE: 154 mg/dL — AB (ref 65–99)
Potassium: 5.3 mmol/L — ABNORMAL HIGH (ref 3.5–5.1)
Sodium: 131 mmol/L — ABNORMAL LOW (ref 135–145)

## 2017-04-08 MED ORDER — LIDOCAINE-PRILOCAINE 2.5-2.5 % EX CREA: 1.0000 "application " | TOPICAL_CREAM | CUTANEOUS | Status: DC | PRN

## 2017-04-08 MED ORDER — ALTEPLASE 2 MG IJ SOLR: 2.0000 mg | Freq: Once | INTRAMUSCULAR | Status: DC | PRN

## 2017-04-08 MED ORDER — PENTAFLUOROPROP-TETRAFLUOROETH EX AERO: 1.0000 "application " | INHALATION_SPRAY | CUTANEOUS | Status: DC | PRN

## 2017-04-08 MED ORDER — AZITHROMYCIN 250 MG PO TABS
500.0000 mg | ORAL_TABLET | Freq: Every day | ORAL | Status: DC
  Administered 2017-04-08 – 2017-04-09 (×2): 500 mg via ORAL
  Filled 2017-04-08 (×2): qty 2

## 2017-04-08 MED ORDER — HEPARIN SODIUM (PORCINE) 1000 UNIT/ML DIALYSIS: 1000.0000 [IU] | INTRAMUSCULAR | Status: DC | PRN

## 2017-04-08 MED ORDER — SODIUM CHLORIDE 0.9 % IV SOLN: 100.0000 mL | INTRAVENOUS | Status: DC | PRN

## 2017-04-08 MED ORDER — HEPARIN SODIUM (PORCINE) 1000 UNIT/ML DIALYSIS: 6000.0000 [IU] | Freq: Once | INTRAMUSCULAR | Status: AC

## 2017-04-08 MED ORDER — GUAIFENESIN-DM 100-10 MG/5ML PO SYRP
5.0000 mL | ORAL_SOLUTION | ORAL | Status: DC
  Administered 2017-04-08 – 2017-04-09 (×5): 5 mL via ORAL
  Filled 2017-04-08 (×6): qty 5

## 2017-04-08 MED ORDER — LIDOCAINE HCL (PF) 1 % IJ SOLN: 5.0000 mL | INTRAMUSCULAR | Status: DC | PRN

## 2017-04-08 MED ORDER — DIPHENHYDRAMINE HCL 25 MG PO CAPS
25.0000 mg | ORAL_CAPSULE | Freq: Four times a day (QID) | ORAL | Status: DC | PRN
  Administered 2017-04-08: 25 mg via ORAL
  Filled 2017-04-08: qty 1

## 2017-04-08 MED ORDER — ALBUTEROL SULFATE (2.5 MG/3ML) 0.083% IN NEBU
2.5000 mg | INHALATION_SOLUTION | Freq: Two times a day (BID) | RESPIRATORY_TRACT | Status: DC
  Administered 2017-04-08 – 2017-04-09 (×2): 2.5 mg via RESPIRATORY_TRACT
  Filled 2017-04-08 (×2): qty 3

## 2017-04-08 NOTE — Progress Notes (Signed)
Ref: Tiffany Hatchet, MD   Subjective:  No significant CAD on cardiac cath yesterday. Had one episode of chest pain this AM. Some exertional shortness of breath.   Objective:  Vital Signs in the last 24 hours: Temp:  [98 F (36.7 C)-98.1 F (36.7 C)] 98.1 F (36.7 C) (03/20 0455) Pulse Rate:  [72-90] 74 (03/20 0455) Cardiac Rhythm: Normal sinus rhythm;Heart block (03/20 0700) Resp:  [12-22] 14 (03/19 1635) BP: (90-148)/(30-86) 124/61 (03/20 0455) SpO2:  [96 %-100 %] 100 % (03/20 0455) Weight:  [124.8 kg (275 lb 3.2 oz)] 124.8 kg (275 lb 3.2 oz) (03/20 0455)  Physical Exam: BP Readings from Last 1 Encounters:  04/08/17 124/61     Wt Readings from Last 1 Encounters:  04/08/17 124.8 kg (275 lb 3.2 oz)    Weight change: -0.181 kg (-6.4 oz) Body mass index is 44.42 kg/m. HEENT: [place]/AT, Eyes-Brown, Right eye blindness, Conjunctiva-Pink, Sclera-Non-icteric Neck: No JVD, No bruit, Trachea midline. Lungs:  Clear, Bilateral. Cardiac:  Regular rhythm, normal S1 and S2, no S3. II/VI systolic murmur. Abdomen:  Soft, non-tender. BS present. Extremities:  No edema present. No cyanosis. No clubbing. Left TDC for dialysis. CNS: AxOx3, Cranial nerves grossly intact, moves all 4 extremities.  Skin: Warm and dry.   Intake/Output from previous day: 03/19 0701 - 03/20 0700 In: 349.1 [P.O.:150; I.V.:199.1] Out: 3100     Lab Results: BMET    Component Value Date/Time   NA 131 (L) 04/08/2017 0328   NA 131 (L) 04/07/2017 0403   NA 133 (L) 04/04/2017 0613   K 5.3 (H) 04/08/2017 0328   K 5.9 (H) 04/07/2017 0403   K 4.8 04/04/2017 0613   CL 94 (L) 04/08/2017 0328   CL 91 (L) 04/07/2017 0403   CL 94 (L) 04/04/2017 0613   CO2 23 04/08/2017 0328   CO2 21 (L) 04/07/2017 0403   CO2 22 04/04/2017 0613   GLUCOSE 154 (H) 04/08/2017 0328   GLUCOSE 109 (H) 04/07/2017 0403   GLUCOSE 97 04/04/2017 0613   BUN 35 (H) 04/08/2017 0328   BUN 76 (H) 04/07/2017 0403   BUN 65 (H) 04/04/2017 0613   CREATININE 6.75 (H) 04/08/2017 0328   CREATININE 11.13 (H) 04/07/2017 0403   CREATININE 9.35 (H) 04/04/2017 0613   CREATININE 2.86 (H) 10/10/2010 1140   CREATININE 2.20 (H) 06/26/2010 1637   CALCIUM 8.7 (L) 04/08/2017 0328   CALCIUM 8.2 (L) 04/07/2017 0403   CALCIUM 8.3 (L) 04/04/2017 0613   CALCIUM 8.7 01/08/2010 1121   CALCIUM 9.1 10/08/2009 0922   CALCIUM 8.6 07/30/2009 1102   GFRNONAA 5 (L) 04/08/2017 0328   GFRNONAA 3 (L) 04/07/2017 0403   GFRNONAA 4 (L) 04/04/2017 0613   GFRAA 6 (L) 04/08/2017 0328   GFRAA 3 (L) 04/07/2017 0403   GFRAA 4 (L) 04/04/2017 0613   CBC    Component Value Date/Time   WBC 6.9 04/08/2017 0328   RBC 3.63 (L) 04/08/2017 0328   HGB 9.8 (L) 04/08/2017 0328   HCT 31.0 (L) 04/08/2017 0328   PLT 192 04/08/2017 0328   MCV 85.4 04/08/2017 0328   MCH 27.0 04/08/2017 0328   MCHC 31.6 04/08/2017 0328   RDW 20.9 (H) 04/08/2017 0328   LYMPHSABS 1.6 10/21/2016 0343   MONOABS 0.7 10/21/2016 0343   EOSABS 0.0 10/21/2016 0343   BASOSABS 0.0 10/21/2016 0343   HEPATIC Function Panel Recent Labs    10/14/16 0326  PROT 6.8   HEMOGLOBIN A1C No components found for: HGA1C,  MPG CARDIAC ENZYMES Lab Results  Component Value Date   CKTOTAL 87 04/13/2010   CKMB 1.0 04/13/2010   TROPONINI <0.03 04/03/2017   TROPONINI <0.03 04/02/2017   TROPONINI <0.03 04/02/2017   BNP No results for input(s): PROBNP in the last 8760 hours. TSH No results for input(s): TSH in the last 8760 hours. CHOLESTEROL Recent Labs    04/03/17 0709  CHOL 79    Scheduled Meds: . albuterol  2.5 mg Nebulization BID  . aspirin EC  81 mg Oral Daily  . atorvastatin  40 mg Oral QHS  . azithromycin  500 mg Oral Daily  . calcitRIOL  0.5 mcg Oral Q T,Th,Sa-HD  . cinacalcet  90 mg Oral Q supper  . clopidogrel  75 mg Oral Daily  . [START ON 04/09/2017] darbepoetin (ARANESP) injection - DIALYSIS  150 mcg Intravenous Q Thu-HD  . guaiFENesin-dextromethorphan  5 mL Oral Q4H while awake  .  insulin aspart  0-9 Units Subcutaneous TID WC  . metoprolol tartrate  12.5 mg Oral BID  . nitroGLYCERIN  0.5 inch Topical Q8H  . sevelamer carbonate  800 mg Oral TID WC  . sodium chloride flush  3 mL Intravenous Q12H  . tiotropium  18 mcg Inhalation Daily   Continuous Infusions: . sodium chloride    . sodium chloride    . sodium chloride     PRN Meds:.sodium chloride, sodium chloride, sodium chloride, nitroGLYCERIN, ondansetron (ZOFRAN) IV, sodium chloride flush  Assessment/Plan: Chest pain Shortness of breath ESRD Hypertension Type 2 DM COPD Morbid obesity Anemia of chronic disease Right eye blindness  Increase activity. Home soon.   LOS: 2 days    Dixie Dials  MD  04/08/2017, 3:00 PM

## 2017-04-08 NOTE — Progress Notes (Addendum)
[facility] Progress Note   [facility] 4[facility]   450/800  124kg  2/2 bath  Hep 13000  L IJ TDC/ L AVF (using 1:1) Venofer 100mg  IV x5 (4/5 dosed) Mircera 160mcg IV q 2weeks (last 3/7) Calcitriol 0.5 mcg PO TIW(takes home sensipar 90 per day) **had been leaving below EDW prior to admission**  Assessment/Plan: 1. Chest pain-Positive Nuc stress.Trop flatSaid heart cath didn't show anything. She doesn't fully understand what Dr. Doylene Canard told her. LHC showed no sig CAD, preserved LV function 2. ESRD - K 5.3 - down from 5.9 yesterday - not sure why high Cr came down using cath at full BFR - plan use catheter at full BFR tomorrow instead of 1: 1 AVF 3. Hypertension/volume-net UF 3.1 Tuesday BP stable post wt 121.7 - pre and post wt differences don't equate with UF volume 4.  Anemia - Hgb9.8 - ESAredosedue 3/21-  last dose of Fe given  to complete course 5. Metabolic bone disease - OnRenvela binder- resumedcalcitriol and sensipar 90 daily; need to repeat P to see if we need to increase binder - also getting outside food at times. 6. Nutrition:Albumin 3.1  Myriam Jacobson, PA-C [place] [phone] 04/08/2017,9:38 AM  LOS: 2 days   Pt seen, examined, agree w assess/plan as above with additions as indicated.  Kelly Splinter MD [facility] pager [phone]    cell [phone] 04/08/2017, 12:51 PM     Subjective:   Feels good  Objective Vitals:   04/07/17 1820 04/07/17 2028 04/07/17 2229 04/08/17 0455  BP: (!) 90/52 (!) 101/48  124/61  Pulse:  90 90 74  Resp:      Temp:  98 F (36.7 C)  98.1 F (36.7 C)  TempSrc:  Oral  Oral  SpO2:  96%  100%  Weight:    124.8 kg (275 lb 3.2 oz)  Height:       Physical Exam General: NAD  Heart: RRR Lungs: no rales Abdomen: obese soft NT Extremities: no LE edema Dialysis Access:  Left AVF + bruit and left [facility]   Additional Objective Labs: Basic Metabolic Panel: Recent  Labs  Lab 04/04/17 0613 04/07/17 0403 04/08/17 0328  NA 133* 131* 131*  K 4.8 5.9* 5.3*  CL 94* 91* 94*  CO2 22 21* 23  GLUCOSE 97 109* 154*  BUN 65* 76* 35*  CREATININE 9.35* 11.13* 6.75*  CALCIUM 8.3* 8.2* 8.7*  PHOS 8.0*  --   --    Liver Function Tests: Recent Labs  Lab 04/04/17 0613  ALBUMIN 3.1*   No results for input(s): LIPASE, AMYLASE in the last 168 hours. CBC: Recent Labs  Lab 04/04/17 0449 04/05/17 0356 04/06/17 0303 04/07/17 0403 04/08/17 0328  WBC 7.8 7.8 7.2 7.8 6.9  HGB 9.9* 9.5* 9.9* 9.3* 9.8*  HCT 31.2* 30.9* 30.6* 29.0* 31.0*  MCV 85.5 84.7 84.5 83.1 85.4  PLT 212 216 187 201 192    Cardiac Enzymes: Recent Labs  Lab 04/02/17 2114 04/02/17 2349 04/03/17 0708  TROPONINI <0.03 <0.03 <0.03   CBG: Recent Labs  Lab 04/07/17 1303 04/07/17 1618 04/07/17 1713 04/07/17 2032 04/08/17 0739  GLUCAP 84 89 71 209* 112*   Iron Studies: No results for input(s): IRON, TIBC, TRANSFERRIN, FERRITIN in the last 72 hours. Lab Results  Component Value Date   INR 1.12 04/06/2017   INR 1.10 07/28/2015   INR 1.16 12/04/2014   Studies/Results: No results found. Medications: . sodium chloride    .  sodium chloride    . sodium chloride     . albuterol  2.5 mg Nebulization TID  . aspirin EC  81 mg Oral Daily  . atorvastatin  40 mg Oral QHS  . calcitRIOL  0.5 mcg Oral Q T,Th,Sa-HD  . cinacalcet  90 mg Oral Q supper  . clopidogrel  75 mg Oral Daily  . [START ON 04/09/2017] darbepoetin (ARANESP) injection - DIALYSIS  150 mcg Intravenous Q Thu-HD  . insulin aspart  0-9 Units Subcutaneous TID WC  . metoprolol tartrate  12.5 mg Oral BID  . nitroGLYCERIN  0.5 inch Topical Q8H  . sevelamer carbonate  800 mg Oral TID WC  . sodium chloride flush  3 mL Intravenous Q12H  . tiotropium  18 mcg Inhalation Daily

## 2017-04-08 NOTE — Plan of Care (Signed)
Patient out of bed, femoral site level 0, no complaints

## 2017-04-09 LAB — RENAL FUNCTION PANEL
ANION GAP: 16 — AB (ref 5–15)
Albumin: 3 g/dL — ABNORMAL LOW (ref 3.5–5.0)
BUN: 62 mg/dL — ABNORMAL HIGH (ref 6–20)
CALCIUM: 7.9 mg/dL — AB (ref 8.9–10.3)
CHLORIDE: 96 mmol/L — AB (ref 101–111)
CO2: 22 mmol/L (ref 22–32)
Creatinine, Ser: 9.27 mg/dL — ABNORMAL HIGH (ref 0.44–1.00)
GFR calc Af Amer: 4 mL/min — ABNORMAL LOW (ref >60)
GFR calc non Af Amer: 4 mL/min — ABNORMAL LOW (ref >60)
GLUCOSE: 113 mg/dL — AB (ref 65–99)
Phosphorus: 8 mg/dL — ABNORMAL HIGH (ref 2.5–4.6)
Potassium: 5 mmol/L (ref 3.5–5.1)
Sodium: 134 mmol/L — ABNORMAL LOW (ref 135–145)

## 2017-04-09 LAB — CBC
HEMATOCRIT: 29.4 % — AB (ref 36.0–46.0)
Hemoglobin: 9.1 g/dL — ABNORMAL LOW (ref 12.0–15.0)
MCH: 26.1 pg (ref 26.0–34.0)
MCHC: 31 g/dL (ref 30.0–36.0)
MCV: 84.5 fL (ref 78.0–100.0)
Platelets: 187 10*3/uL (ref 150–400)
RBC: 3.48 MIL/uL — ABNORMAL LOW (ref 3.87–5.11)
RDW: 20.6 % — AB (ref 11.5–15.5)
WBC: 7.4 10*3/uL (ref 4.0–10.5)

## 2017-04-09 LAB — GLUCOSE, CAPILLARY
GLUCOSE-CAPILLARY: 81 mg/dL (ref 65–99)
Glucose-Capillary: 83 mg/dL (ref 65–99)

## 2017-04-09 MED ORDER — SEVELAMER CARBONATE 800 MG PO TABS: 1600.0000 mg | ORAL_TABLET | ORAL | Status: DC

## 2017-04-09 MED ORDER — AZITHROMYCIN 250 MG PO TABS
ORAL_TABLET | ORAL | 0 refills | Status: DC
Start: 2017-04-09 — End: 2017-05-07

## 2017-04-09 MED ORDER — INSULIN GLARGINE 100 UNIT/ML ~~LOC~~ SOLN: 5.0000 [IU] | Freq: Every day | SUBCUTANEOUS | Status: DC

## 2017-04-09 MED ORDER — SEVELAMER CARBONATE 800 MG PO TABS
2400.0000 mg | ORAL_TABLET | Freq: Three times a day (TID) | ORAL | Status: DC
  Administered 2017-04-09: 2400 mg via ORAL
  Filled 2017-04-09: qty 3

## 2017-04-09 MED ORDER — GUAIFENESIN-DM 100-10 MG/5ML PO SYRP: 5.0000 mL | ORAL_SOLUTION | ORAL | 0 refills | Status: DC

## 2017-04-09 MED ORDER — SEVELAMER CARBONATE 800 MG PO TABS: 1600.0000 mg | ORAL_TABLET | Freq: Three times a day (TID) | ORAL | Status: DC

## 2017-04-09 MED ORDER — SEVELAMER CARBONATE 800 MG PO TABS: 1600.0000 mg | ORAL_TABLET | ORAL | Status: DC | PRN

## 2017-04-09 MED ORDER — DARBEPOETIN ALFA 150 MCG/0.3ML IJ SOSY
PREFILLED_SYRINGE | INTRAMUSCULAR | Status: AC
  Administered 2017-04-09: 150 ug via INTRAVENOUS
  Filled 2017-04-09: qty 0.3

## 2017-04-09 MED ORDER — CALCITRIOL 0.5 MCG PO CAPS
ORAL_CAPSULE | ORAL | Status: AC
  Administered 2017-04-09: 0.5 ug via ORAL
  Filled 2017-04-09: qty 1

## 2017-04-09 MED ORDER — CARVEDILOL 25 MG PO TABS: 12.5000 mg | ORAL_TABLET | Freq: Two times a day (BID) | ORAL | Status: DC

## 2017-04-09 NOTE — Discharge Summary (Signed)
Physician Discharge Summary  Patient ID: [name] MRN: [id number] DOB/AGE: 04-15-1945 72 y.o.  Admit date: 04/02/2017 Discharge date: 04/09/2017  Admission Diagnoses: Unstable angina CAD CHF with preserved LV systolic function Hypertension Type 2 DM COPD Obstructive sleep apnea Morbid obesity ESRD Hiatal hernia Anemia of chronic disease Right eye blindness  Discharge Diagnoses:  Principle diagnosis: Acute on chronic CHF with preserved LV systolic function Active Problems: Chest pain Shortness of breath Acute bronchitis Mild AS Mild MS ESRD Type 2 DM Hypertension COPD Morbid obesity Anemia of chronic disease Right eye blindness Hiatal hernia Obstructive sleep apnea Hyperkalemia, resolved   Discharged Condition: fair  Hospital Course: 72 year old female presented with left sided chest pain and shortness of breath. She had abnormal nuclear stress test but her coronaries were near normal on cardiac catheterization. Her echocardiogram showed preserved LV systolic function. She had extra fluid taken out by hemodialysis and she was able to breath better. Her hyperkalemia also improved post hemodialysis also.  She started having coughing spell which responded well to PO azithromycin and robitussin DM. She was discharged home in stable condition and will follow with primary care in 1 week and me in 1 week.  Consults: cardiology and nephrology.  Significant Diagnostic Studies: labs: Normal CBC except Hgb of 10.5 g/dl Normal Lipid panel with LDL of 27 mg. Hgb A1C is 5.6. Electrolytes are normal BUN was 24 and Creatinine was 5.14 to 11.13. Potassium was 5.9 on 04/07/2017 and 4.7 on 04/08/2017.  EKG-NSR.  Echocardiogram: Preserved LV systolic function, mild diastolic dysfunction and mild MS and AS.  Cardiac catheterization: Normal coronaries. EF 60-65 % by echocardiogram.  Chest x-ray: No acute abnormality.  NM Myocardial Perfusion stress test : Global hypokinesia, EF  18 % and larger reversible defect.  Treatments: dialysis: Hemodialysis and cardiac catheterization.  Discharge Exam: Blood pressure (!) 131/58, pulse 67, temperature 98.2 F (36.8 C), temperature source Oral, resp. rate 16, height 5\' 6"  (1.676 m), weight 125.7 kg (277 lb 3.2 oz), SpO2 98 %. General appearance: alert, cooperative and appears stated age. Head: Normocephalic, atraumatic. Eyes: Brown eyes, Pale pink conjunctiva, corneas clear. Right eye blindness.  Neck: No adenopathy, no carotid bruit, no JVD, supple, symmetrical, trachea midline and thyroid not enlarged. Resp: Clear to auscultation bilaterally. Cardio: Regular rate and rhythm, S1, S2 normal, III/VI systolic murmur, no click, rub or gallop. GI: Soft, non-tender; bowel sounds normal; no organomegaly. Extremities: No edema, cyanosis or clubbing. Left IJ TDC Skin: Warm and dry.  Neurologic: Alert and oriented X 3, normal strength and tone. Normal coordination and gait.  Disposition: Discharge disposition: 01-Home or Self Care        Allergies as of 04/09/2017      Reactions   Lyrica [pregabalin] Other (See Comments)   Hallucinations      Medication List    STOP taking these medications   midodrine 10 MG tablet Commonly known as:  PROAMATINE   multivitamin with minerals Tabs tablet     TAKE these medications   albuterol 108 (90 Base) MCG/ACT inhaler Commonly known as:  PROVENTIL HFA;VENTOLIN HFA Inhale 2 puffs into the lungs every 4 (four) hours as needed for wheezing or shortness of breath.   albuterol (2.5 MG/3ML) 0.083% nebulizer solution Commonly known as:  PROVENTIL Take 3 mLs (2.5 mg total) by nebulization every 2 (two) hours as needed for shortness of breath.   aspirin EC 81 MG tablet Take 81 mg by mouth daily.   azithromycin 250 MG tablet  Commonly known as:  ZITHROMAX One by mouth in AM x one   carvedilol 25 MG tablet Commonly known as:  COREG Take 0.5 tablets (12.5 mg total) by mouth 2  (two) times daily. What changed:  how much to take   cinacalcet 90 MG tablet Commonly known as:  SENSIPAR Take 1 tablet (90 mg) by mouth daily. On dialysis days, take the dose after dialysis   diphenhydrAMINE 25 mg capsule Commonly known as:  BENADRYL Take 50 mg by mouth See admin instructions. Take 2 tablets (50 mg) by mouth prior to dialysis on Tuesday, Thursday, Saturday - for itching   ethyl chloride spray Apply 1 application topically See admin instructions. Apply topically before dialysis on Tuesday, Thursday, Saturday   guaiFENesin-dextromethorphan 100-10 MG/5ML syrup Commonly known as:  ROBITUSSIN DM Take 5 mLs by mouth every 4 (four) hours while awake.   HYDROcodone-acetaminophen 5-325 MG tablet Commonly known as:  NORCO Take 1 tablet by mouth every 6 (six) hours as needed for moderate pain.   insulin glargine 100 UNIT/ML injection Commonly known as:  LANTUS Inject 0.05 mLs (5 Units total) into the skin at bedtime.   sevelamer carbonate 800 MG tablet Commonly known as:  RENVELA Take 2-3 tablets (1,600-2,400 mg total) by mouth See admin instructions. Take 3 tablets (2,400 mg) by mouth 3 times daily with meals. Take 2 tablets (1,600 mg) by mouth with snacks. Receives supply from dialysis center. What changed:    how much to take  when to take this  additional instructions   tiotropium 18 MCG inhalation capsule Commonly known as:  SPIRIVA Place 1 capsule (18 mcg total) into inhaler and inhale daily.      Follow-up Information    [name], MD. Schedule an appointment as soon as possible for a visit in 1 month(s).   Specialty:  Internal Medicine Contact information: [place] [address] [phone]        Dixie Dials, MD. Schedule an appointment as soon as possible for a visit in 1 week(s).   Specialty:  Cardiology Contact information: [place] [address] [phone]           Signed: Birdie Riddle 04/09/2017, 1:22 PM

## 2017-04-09 NOTE — Progress Notes (Signed)
Pt came back from dialysis.  Discharge instruction was given to pt. Pt would like to eat before discharge.  Idolina Primer, RN

## 2017-04-09 NOTE — Progress Notes (Addendum)
[facility] Progress Note   Dialysis Orders: [facility] 4.5h 450/800 124kg 2/2 bath Hep 13000 L IJ TDC/ L AVF (using 1:1) Venofer 100mg  IV x5 (4/5 dosed) Mircera 172mcg IV q 2weeks (last 3/7) Calcitriol 0.5 mcg PO TIW(takes home sensipar 90 per day) **had been leaving below EDW prior to admission**  Assessment/Plan: 1. Chest pain-Positive Nuc stress.Trop flat LHC no sig CAD/preserved LV function 2. ESRD- K 4.7 last pm- plan use catheter at full BFR today for better kinetics instead of 1: 1 AVF- resume AVF use at d/c 3. Hypertension/volume-net UF 3.1 Tuesday BP stable post wt 121.7 - pre and post wt differences don't equate with UF volume- needs standing wt with HD today- may need to add midodrine for BP support 4. Anemia - Hgb9.1- ESAredosedue 3/21- last dose of Fe givento complete course 5. Metabolic bone disease - OnRenvela binder 1 ac- resumedcalcitriol and sensipar 90 daily; P high 7.5  increase binder to 3 ac and 2 with snacks - her usual dose- also getting outside food at times which can affect timing/efficacyof binders 6. Nutrition:Albumin 3.1 7. Disp - ok per renal d/c post HD  Myriam Jacobson, PA-C [place] [phone] 04/09/2017,9:23 AM  LOS: 3 days   Pt seen, examined and agree w A/P as above.  Kelly Splinter MD [facility] pager [phone]   04/09/2017, 10:50 AM     Subjective:   No c/o  Objective Vitals:   04/08/17 1957 04/08/17 2117 04/08/17 2126 04/09/17 0540  BP: (!) 104/42   (!) 99/31  Pulse: 69 70  66  Resp:      Temp: 97.6 F (36.4 C)   98.1 F (36.7 C)  TempSrc: Oral   Oral  SpO2: 96%  96% 98%  Weight:    125.3 kg (276 lb 4.8 oz)  Height:       Physical Exam General: NAD sitting in chair Heart: RRR Lungs:few crackles at bases Abdomen: obese soft NT Extremities: no LE edema Dialysis Access:  Left AVF + bruit and left [facility]   Additional Objective Labs: Basic  Metabolic Panel: Recent Labs  Lab 04/04/17 0613 04/07/17 0403 04/08/17 0328 04/08/17 1818  NA 133* 131* 131* 131*  K 4.8 5.9* 5.3* 4.7  CL 94* 91* 94* 92*  CO2 22 21* 23 23  GLUCOSE 97 109* 154* 128*  BUN 65* 76* 35* 48*  CREATININE 9.35* 11.13* 6.75* 8.29*  CALCIUM 8.3* 8.2* 8.7* 8.6*  PHOS 8.0*  --   --  7.5*   Liver Function Tests: Recent Labs  Lab 04/04/17 0613 04/08/17 1818  ALBUMIN 3.1* 3.1*   No results for input(s): LIPASE, AMYLASE in the last 168 hours. CBC: Recent Labs  Lab 04/06/17 0303 04/07/17 0403 04/08/17 0328 04/08/17 1818 04/09/17 0414  WBC 7.2 7.8 6.9 7.3 7.4  HGB 9.9* 9.3* 9.8* 10.2* 9.1*  HCT 30.6* 29.0* 31.0* 32.1* 29.4*  MCV 84.5 83.1 85.4 85.1 84.5  PLT 187 201 192 191 187   Blood Culture    Component Value Date/Time   SDES BLOOD RIGHT ANTECUBITAL 02/01/2016 0839   SPECREQUEST BOTTLES DRAWN AEROBIC AND ANAEROBIC 5CC 02/01/2016 0839   CULT NO GROWTH 5 DAYS 02/01/2016 0839   REPTSTATUS 02/06/2016 FINAL 02/01/2016 0839    Cardiac Enzymes: Recent Labs  Lab 04/02/17 2114 04/02/17 2349 04/03/17 0708  TROPONINI <0.03 <0.03 <0.03   CBG: Recent Labs  Lab 04/08/17 0739 04/08/17 1122 04/08/17 1633 04/08/17 2000 04/09/17 0741  GLUCAP 112* 136*  119* 121* 83   Iron Studies: No results for input(s): IRON, TIBC, TRANSFERRIN, FERRITIN in the last 72 hours. Lab Results  Component Value Date   INR 1.12 04/06/2017   INR 1.10 07/28/2015   INR 1.16 12/04/2014   Studies/Results: No results found. Medications: . sodium chloride    . sodium chloride    . sodium chloride    . sodium chloride    . sodium chloride     . albuterol  2.5 mg Nebulization BID  . aspirin EC  81 mg Oral Daily  . atorvastatin  40 mg Oral QHS  . azithromycin  500 mg Oral Daily  . calcitRIOL  0.5 mcg Oral Q T,Th,Sa-HD  . cinacalcet  90 mg Oral Q supper  . clopidogrel  75 mg Oral Daily  . darbepoetin (ARANESP) injection - DIALYSIS  150 mcg Intravenous Q Thu-HD   . guaiFENesin-dextromethorphan  5 mL Oral Q4H while awake  . insulin aspart  0-9 Units Subcutaneous TID WC  . metoprolol tartrate  12.5 mg Oral BID  . nitroGLYCERIN  0.5 inch Topical Q8H  . sevelamer carbonate  800 mg Oral TID WC  . sodium chloride flush  3 mL Intravenous Q12H  . tiotropium  18 mcg Inhalation Daily

## 2017-04-10 ENCOUNTER — Encounter (HOSPITAL_COMMUNITY): Payer: Self-pay | Admitting: Cardiovascular Disease

## 2017-04-11 DIAGNOSIS — N186 End stage renal disease: Secondary | ICD-10-CM | POA: Diagnosis not present

## 2017-04-11 DIAGNOSIS — N2581 Secondary hyperparathyroidism of renal origin: Secondary | ICD-10-CM | POA: Diagnosis not present

## 2017-04-13 DIAGNOSIS — I5032 Chronic diastolic (congestive) heart failure: Secondary | ICD-10-CM | POA: Diagnosis not present

## 2017-04-13 DIAGNOSIS — I2510 Atherosclerotic heart disease of native coronary artery without angina pectoris: Secondary | ICD-10-CM | POA: Diagnosis not present

## 2017-04-13 DIAGNOSIS — E782 Mixed hyperlipidemia: Secondary | ICD-10-CM | POA: Diagnosis not present

## 2017-04-13 DIAGNOSIS — N186 End stage renal disease: Secondary | ICD-10-CM | POA: Diagnosis not present

## 2017-04-14 DIAGNOSIS — N186 End stage renal disease: Secondary | ICD-10-CM | POA: Diagnosis not present

## 2017-04-14 DIAGNOSIS — N2581 Secondary hyperparathyroidism of renal origin: Secondary | ICD-10-CM | POA: Diagnosis not present

## 2017-04-14 DIAGNOSIS — E782 Mixed hyperlipidemia: Secondary | ICD-10-CM | POA: Diagnosis not present

## 2017-04-15 DIAGNOSIS — E782 Mixed hyperlipidemia: Secondary | ICD-10-CM | POA: Diagnosis not present

## 2017-04-16 DIAGNOSIS — N186 End stage renal disease: Secondary | ICD-10-CM | POA: Diagnosis not present

## 2017-04-16 DIAGNOSIS — E782 Mixed hyperlipidemia: Secondary | ICD-10-CM | POA: Diagnosis not present

## 2017-04-16 DIAGNOSIS — N2581 Secondary hyperparathyroidism of renal origin: Secondary | ICD-10-CM | POA: Diagnosis not present

## 2017-04-17 DIAGNOSIS — E782 Mixed hyperlipidemia: Secondary | ICD-10-CM | POA: Diagnosis not present

## 2017-04-18 DIAGNOSIS — N186 End stage renal disease: Secondary | ICD-10-CM | POA: Diagnosis not present

## 2017-04-18 DIAGNOSIS — N2581 Secondary hyperparathyroidism of renal origin: Secondary | ICD-10-CM | POA: Diagnosis not present

## 2017-04-20 ENCOUNTER — Encounter (INDEPENDENT_AMBULATORY_CARE_PROVIDER_SITE_OTHER): Payer: Self-pay | Admitting: Ophthalmology

## 2017-04-20 ENCOUNTER — Ambulatory Visit (INDEPENDENT_AMBULATORY_CARE_PROVIDER_SITE_OTHER): Payer: Medicare HMO | Admitting: Ophthalmology

## 2017-04-20 DIAGNOSIS — Z970 Presence of artificial eye: Secondary | ICD-10-CM

## 2017-04-20 DIAGNOSIS — Z961 Presence of intraocular lens: Secondary | ICD-10-CM

## 2017-04-20 DIAGNOSIS — H43812 Vitreous degeneration, left eye: Secondary | ICD-10-CM

## 2017-04-20 DIAGNOSIS — E782 Mixed hyperlipidemia: Secondary | ICD-10-CM | POA: Diagnosis not present

## 2017-04-20 DIAGNOSIS — Z992 Dependence on renal dialysis: Secondary | ICD-10-CM | POA: Diagnosis not present

## 2017-04-20 DIAGNOSIS — E1129 Type 2 diabetes mellitus with other diabetic kidney complication: Secondary | ICD-10-CM | POA: Diagnosis not present

## 2017-04-20 DIAGNOSIS — H3581 Retinal edema: Secondary | ICD-10-CM

## 2017-04-20 DIAGNOSIS — H4312 Vitreous hemorrhage, left eye: Secondary | ICD-10-CM | POA: Diagnosis not present

## 2017-04-20 DIAGNOSIS — H43392 Other vitreous opacities, left eye: Secondary | ICD-10-CM | POA: Diagnosis not present

## 2017-04-20 DIAGNOSIS — N186 End stage renal disease: Secondary | ICD-10-CM | POA: Diagnosis not present

## 2017-04-20 NOTE — Progress Notes (Signed)
[facility] Note  04/20/2017     CHIEF COMPLAINT Patient presents for Retina Evaluation and Diabetic Eye Exam   HISTORY OF PRESENT ILLNESS: [name] is a 72 y.o. female who presents to the clinic today for:   HPI    Retina Evaluation    In left eye.  This started 1 day ago.  Associated Symptoms Floaters.  Negative for Flashes, Pain, Fever, Weight Loss, Scalp Tenderness, Redness, Distortion, Photophobia, Jaw Claudication, Fatigue, Blind Spot, Glare, Trauma and Shoulder/Hip pain.  Context:  distance vision, mid-range vision and near vision.  Treatments tried include no treatments.  I, the attending physician,  performed the HPI with the patient and updated documentation appropriately.          Diabetic Eye Exam    Vision is stable.  Associated Symptoms Negative for Flashes, Pain, Trauma, Fever, Weight Loss, Scalp Tenderness, Floaters, Redness, Photophobia, Distortion, Jaw Claudication, Fatigue, Shoulder/Hip pain, Glare and Blind Spot.  Diabetes characteristics include Type 1 and on insulin.  This started 20 years ago.  Blood sugar level is controlled.  Last Blood Glucose 211.  Associated Diagnosis Kidney Disease and Dialysis.  I, the attending physician,  performed the HPI with the patient and updated documentation appropriately.          Comments    Referral of DR. Hensel for retina evaluation ( poss.retinal tear). Patient states she noticed yesterday when she woke up "water bugs left eye, this morning she noticed spider webs. Pt reports she fell appx 10 years ago into a bed frame, hitting her right eye, causing her to lose her eye(she has artificial eye). Pt denies ocular pain, flashes and distortion. Pt has been diabetic for appx 6 yrs, Bs 211 this am. PT reports Bs are stable, normally  lower 100's. A1C unknown results. Pt is on dialysis. Pt reports she had cataract sx Ou  appx 20 yrs ago. Denies vit's/gtt's       Last edited by Bernarda Caffey, MD  on 04/20/2017  5:22 PM. (History)    Pt states went to Dr. Andria Frames for urgent visit; Pt reports she has been having floaters and seeing spider webs x 1 day OS; Pt denies flashes, pt states there has been no change in floaters or spider webs; pt states she is diabetic, states CBG is stable, "runs about 100s"; Pt reports she does have HTN, but is unaware of "the numbers"; Pt reports Dr. Toy Cookey removed OD due to "falling and it popping out"; Pt states she was fitted with prosthetic in [place] and goes q1 year to have it cleaned and "taken care of"; Pt states Dr. Gershon Crane removed cataract OS;   Referring physician: Drake Leach, [place], [place] [id number]  HISTORICAL INFORMATION:   Selected notes from the MEDICAL RECORD NUMBER Referred by Dr. Delane Ginger. Hensel for possible RT OS;  LEE-  Ocular Hx-  PMH-     CURRENT MEDICATIONS: No current outpatient medications on file. (Ophthalmic Drugs)   No current facility-administered medications for this visit.  (Ophthalmic Drugs)   Current Outpatient Medications (Other)  Medication Sig  . albuterol (PROVENTIL HFA;VENTOLIN HFA) 108 (90 BASE) MCG/ACT inhaler Inhale 2 puffs into the lungs every 4 (four) hours as needed for wheezing or shortness of breath.  Marland Kitchen albuterol (PROVENTIL) (2.5 MG/3ML) 0.083% nebulizer solution Take 3 mLs (2.5 mg total) by nebulization every 2 (two) hours as needed for shortness of breath.  Marland Kitchen aspirin EC 81 MG  tablet Take 81 mg by mouth daily.    . carvedilol (COREG) 25 MG tablet Take 0.5 tablets (12.5 mg total) by mouth 2 (two) times daily.  . cinacalcet (SENSIPAR) 90 MG tablet Take 1 tablet (90 mg) by mouth daily. On dialysis days, take the dose after dialysis  . diphenhydrAMINE (BENADRYL) 25 mg capsule Take 50 mg by mouth See admin instructions. Take 2 tablets (50 mg) by mouth prior to dialysis on Tuesday, Thursday, Saturday - for itching  . ethyl chloride spray Apply 1 application topically See admin instructions. Apply  topically before dialysis on Tuesday, Thursday, Saturday  . HYDROcodone-acetaminophen (NORCO) 5-325 MG tablet Take 1 tablet by mouth every 6 (six) hours as needed for moderate pain.  Marland Kitchen insulin glargine (LANTUS) 100 UNIT/ML injection Inject 0.05 mLs (5 Units total) into the skin at bedtime.  . sevelamer carbonate (RENVELA) 800 MG tablet Take 2-3 tablets (1,600-2,400 mg total) by mouth See admin instructions. Take 3 tablets (2,400 mg) by mouth 3 times daily with meals. Take 2 tablets (1,600 mg) by mouth with snacks. Receives supply from dialysis center.  . tiotropium (SPIRIVA) 18 MCG inhalation capsule Place 1 capsule (18 mcg total) into inhaler and inhale daily.  Marland Kitchen azithromycin (ZITHROMAX) 250 MG tablet One by mouth in AM x one (Patient not taking: Reported on 04/20/2017)  . guaiFENesin-dextromethorphan (ROBITUSSIN DM) 100-10 MG/5ML syrup Take 5 mLs by mouth every 4 (four) hours while awake. (Patient not taking: Reported on 04/20/2017)   No current facility-administered medications for this visit.  (Other)      REVIEW OF SYSTEMS: ROS    Positive for: Neurological, Genitourinary, Musculoskeletal, Endocrine, Cardiovascular, Eyes   Negative for: Constitutional, Gastrointestinal, Skin, HENT, Respiratory, Psychiatric, Allergic/Imm, Heme/Lymph   Last edited by Zenovia Jordan, LPN on 05/27/175  9:39 PM. (History)       ALLERGIES Allergies  Allergen Reactions  . Lyrica [Pregabalin] Other (See Comments)    Hallucinations    PAST MEDICAL HISTORY Past Medical History:  Diagnosis Date  . Abdominal abscess 12-17-10   abdominal abscesses x2 ? one at this time  . Anemia   . Arthritis    "all over" (08/23/2012)  . Blind right eye    fell and crushed socket and eye fell out, was replaced at [facility]  . CHF (congestive heart failure) ([place])   . Chronic lower back pain   . Coronary artery disease    normal coronaries by 08/25/12 cath  . Dyspnea   . Dysrhythmia    irregular, "skips beats"  . ESRD  (end stage renal disease) on dialysis ([place])    "started 12/2011; [facility]; TTS" (08/23/2012  . Exertional shortness of breath   . Eye drainage    "lots; since fall 07/2011" (08/23/2012)  . Gout   . H/O hiatal hernia   . Headache(784.0)    "weekly sometimes; since I fell and hit my head in 07/2011 08/23/2012)  . Heart murmur   . History of blood transfusion    "lots before starting dialysis" (08/23/2012)  . Hypertension    sees Dr. Willey Blade  . Inhalation injury    "worked at [employer]; can't inhale polyurethane or paint, etc" (08/23/2012)  . Myocardial infarction ([place]) 1970's or 1980's  . Neuromuscular disorder ([place])    diabetic neuropathy  . OSA on CPAP   . Staph aureus infection November 2012   . Swelling of both ankles 12-17-10  . Type II diabetes mellitus ([place])    "over 10 years now" (  08/23/2012)   Past Surgical History:  Procedure Laterality Date  . APPENDECTOMY    . AV FISTULA PLACEMENT  09/30/2011   Procedure: ARTERIOVENOUS (AV) FISTULA CREATION;  Surgeon: Conrad Clayhatchee, MD;  Location: [facility];  Service: Vascular;  Laterality: Left;  BRACHIAL-CEPHALIC  . BACK SURGERY    . BASCILIC VEIN TRANSPOSITION Left 11/10/2011   Left arm   . [place] TRANSPOSITION  01/28/2012   Procedure: BASCILIC VEIN TRANSPOSITION;  Surgeon: Conrad Mesa, MD;  Location: [facility];  Service: Vascular;  Laterality: Left;  Second Stage   . CARDIAC CATHETERIZATION  1980's  . CATARACT EXTRACTION W/ INTRAOCULAR LENS IMPLANT Bilateral 1990's  . CORONARY ANGIOGRAPHY N/A 04/07/2017   Procedure: CORONARY ANGIOGRAPHY (CATH LAB);  Surgeon: Dixie Dials, MD;  Location: [facility] CV [facility];  Service: Cardiovascular;  Laterality: N/A;  . DILATION AND CURETTAGE OF UTERUS  1960's  . EYE SURGERY    . FISTULA SUPERFICIALIZATION Left 02/18/2017   Procedure: SUPERFICIALIZATION LEFT ARM ARTERIOVENOUS FISTULA;  Surgeon: Serafina Mitchell, MD;  Location: [facility];  Service: Vascular;  Laterality: Left;  . HERNIA REPAIR    .  INSERTION OF DIALYSIS CATHETER  01/05/2012   Procedure: INSERTION OF DIALYSIS CATHETER;  Surgeon: Conrad , MD;  Location: [facility];  Service: Vascular;  Laterality: N/A;  Right Internal Jugular Placement  . INTRAOCULAR PROSTHESES INSERTION Right 2013   "fell; knocked my eye outl" (08/23/2012)  . JOINT REPLACEMENT    . LAPAROTOMY  12/18/2010   Procedure: EXPLORATORY LAPAROTOMY;  Surgeon: Rolm Bookbinder, MD;  Location: [facility];  Service: General;  Laterality: N/A;  Abdominal Seroma Evacuation  . LEFT HEART CATHETERIZATION WITH CORONARY ANGIOGRAM N/A 08/25/2012   Procedure: LEFT HEART CATHETERIZATION WITH CORONARY ANGIOGRAM;  Surgeon: Birdie Riddle, MD;  Location: [facility] C[facility];  Service: Cardiovascular;  Laterality: N/A;  . LUMBAR DISC SURGERY  1970'-80's   X 3  . TOTAL KNEE ARTHROPLASTY Left 1980's  . VAGINAL HYSTERECTOMY  1970's  . VENTRAL HERNIA REPAIR  2012-2013   component separation, repair with biologic; "had 3 surgeries to fix it" (08/23/2012)  . WOUND DEBRIDEMENT  03/20/2011   Procedure: DEBRIDEMENT ABDOMINAL WOUND;  Surgeon: Rolm Bookbinder, MD;  Location: [facility] OR;  Service: General;  Laterality: N/A;  debridement abdominal wall, placement of wound vac    FAMILY HISTORY Family History  Problem Relation Age of Onset  . Hypertension Mother   . Cancer Brother        spine  . Hypertension Father   . Cancer Sister        brain  . Hypertension Maternal Grandmother   . Anesthesia problems Neg Hx     SOCIAL HISTORY Social History   Tobacco Use  . Smoking status: Former Smoker    Packs/day: 0.25    Years: 16.00    Pack years: 4.00    Types: Cigarettes    Last attempt to quit: 12/16/1980    Years since quitting: 36.3  . Smokeless tobacco: Never Used  Substance Use Topics  . Alcohol use: No  . Drug use: No         OPHTHALMIC EXAM:  Base Eye Exam    Visual Acuity (Snellen - Linear)      Right Left   Dist [place]  20/60 -2   Dist ph [place]  NI  Prosthesis OD        Tonometry (Tonopen, 3:20 PM)      Right Left   Pressure  16  Prosthesis OD       Pupils      Dark Light Shape React APD   Right        Left 5 5 Round None None  Prosthesis OD       Visual Fields (Counting fingers)      Left Right    Full    Restrictions  Total superior temporal, inferior temporal, superior nasal, inferior nasal deficiencies  Prosthesis OD       Extraocular Movement      Right Left    Full Full       Neuro/Psych    Oriented x3:  Yes   Mood/Affect:  Normal       Dilation    Left eye:  1.0% Mydriacyl, 2.5% Phenylephrine @ 3:20 PM  Prosthesis OD        Slit Lamp and Fundus Exam    Slit Lamp Exam      Right Left   Lids/Lashes  Lid retraction   Conjunctiva/Sclera Prosthetic in good postion Melanosis   Cornea  3+ Punctate epithelial erosions, irregular epithelium   Anterior Chamber  Deep and quiet   Iris  Round and dilated, No NVI   Lens  Three piece PC IOL in good position   Vitreous  Vitreous hemorrhage, Posterior vitreous detachment, Vitreous syneresis; decent red reflex       Fundus Exam      Right Left   Disc  Hazy view, clump of pre-retinal hemorrhage on temporal disc   Macula  hazy view; grossly attached   Vessels  periphery appears normal   Periphery  Hazy view posteriorly; periphery attached without obvious retinal tear 360 scleral depressed exam; Preretinal hemorrhage inferiorly        Refraction    Manifest Refraction   Unable to improve vision any w/Mrx OS.  Prosthesis OD.          IMAGING AND PROCEDURES  Imaging and Procedures for 04/21/17  OCT, Retina - OU - Both Eyes       Right Eye Quality was (Prosthetic).   Left Eye Quality was good. Central Foveal Thickness: 238. Progression has no prior data. Findings include normal foveal contour, no IRF, no SRF (Vitreous opacity superiorly).   Notes *Images captured and stored on drive  Diagnosis / Impression:  OD: Prosthetic OS: NFP, No IRF/SRF  Clinical  management:  See below  Abbreviations: NFP - Normal foveal profile. CME - cystoid macular edema. PED - pigment epithelial detachment. IRF - intraretinal fluid. SRF - subretinal fluid. EZ - ellipsoid zone. ERM - epiretinal membrane. ORA - outer retinal atrophy. ORT - outer retinal tubulation. SRHM - subretinal hyper-reflective material                  ASSESSMENT/PLAN:    ICD-10-CM   1. Posterior vitreous detachment of left eye H43.812   2. Vitreous hemorrhage of left eye (HCC) H43.12   3. Retinal edema H35.81 OCT, Retina - OU - Both Eyes  4. Pseudophakia of left eye Z96.1   5. 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 Hemorrhagic PVD OS-  Discussed findings and prognosis  No RT or RD on 360 scleral depressed exam  Reviewed s/s of RT/RD  Strict return precautions for any such RT/RD signs/symptoms  VH precautions reviewed -- minimize activities, keep head elevated, avoid ASA/NSAIDs/blood thinners as able  Will follow closely as monocular  F/U Friday  3. No retinal edema on exam or OCT  4.  Pseudophakia OS-  - s/p CE/IOL  - beautiful surgery, doing well  - monitor  5. Prosthetic OD-  - prosthetic in good position - traumatic fall w/ open globe repair 2013 (Dr. Lennon Alstrom) --> enucleated by Dr. Toy Cookey - had prosthetic cleaned recently in [facility] - monitor  Ophthalmic Meds Ordered this visit:  No orders of the defined types were placed in this encounter.      Return in about 1 week (around 04/27/2017) for F/U hemorrhagic PVD OS.  There are no Patient Instructions on file for this visit.   Explained the diagnoses, plan, and follow up with the patient and they expressed understanding.  Patient expressed understanding of the importance of proper follow up care.   This document serves as a record of services personally performed by Gardiner Sleeper, MD, PhD. It was created on their behalf by Catha Brow, [place], a certified ophthalmic assistant. The creation of this record is  the provider's dictation and/or activities during the visit.  Electronically signed by: Catha Brow, [place]  04/21/17 8:49 AM   Gardiner Sleeper, M.D., Ph.D. Diseases & Surgery of the Retina and [place] 04/21/17  I have reviewed the above documentation for accuracy and completeness, and I agree with the above. Gardiner Sleeper, M.D., Ph.D. 04/21/17 8:49 AM     Abbreviations: M myopia (nearsighted); A astigmatism; H hyperopia (farsighted); P presbyopia; Mrx spectacle prescription;  CTL contact lenses; OD right eye; OS left eye; OU both eyes  XT exotropia; ET esotropia; PEK punctate epithelial keratitis; PEE punctate epithelial erosions; DES dry eye syndrome; MGD meibomian gland dysfunction; ATs artificial tears; PFAT's preservative free artificial tears; Lykens nuclear sclerotic cataract; PSC posterior subcapsular cataract; ERM epi-retinal membrane; PVD posterior vitreous detachment; RD retinal detachment; DM diabetes mellitus; DR diabetic retinopathy; NPDR non-proliferative diabetic retinopathy; PDR proliferative diabetic retinopathy; CSME clinically significant macular edema; DME diabetic macular edema; dbh dot blot hemorrhages; CWS cotton wool spot; POAG primary open angle glaucoma; C/D cup-to-disc ratio; HVF humphrey visual field; GVF goldmann visual field; OCT optical coherence tomography; IOP intraocular pressure; BRVO Branch retinal vein occlusion; CRVO central retinal vein occlusion; CRAO central retinal artery occlusion; BRAO branch retinal artery occlusion; RT retinal tear; SB scleral buckle; PPV pars plana vitrectomy; VH Vitreous hemorrhage; PRP panretinal laser photocoagulation; IVK intravitreal kenalog; VMT vitreomacular traction; MH Macular hole;  NVD neovascularization of the disc; NVE neovascularization elsewhere; AREDS age related eye disease study; ARMD age related macular degeneration; POAG primary open angle glaucoma; EBMD epithelial/anterior  basement membrane dystrophy; ACIOL anterior chamber intraocular lens; IOL intraocular lens; PCIOL posterior chamber intraocular lens; Phaco/IOL phacoemulsification with intraocular lens placement; Glide photorefractive keratectomy; LASIK laser assisted in situ keratomileusis; HTN hypertension; DM diabetes mellitus; COPD chronic obstructive pulmonary disease

## 2017-04-21 ENCOUNTER — Encounter (INDEPENDENT_AMBULATORY_CARE_PROVIDER_SITE_OTHER): Payer: Self-pay | Admitting: Ophthalmology

## 2017-04-21 DIAGNOSIS — G4733 Obstructive sleep apnea (adult) (pediatric): Secondary | ICD-10-CM | POA: Diagnosis not present

## 2017-04-21 DIAGNOSIS — E782 Mixed hyperlipidemia: Secondary | ICD-10-CM | POA: Diagnosis not present

## 2017-04-21 DIAGNOSIS — J449 Chronic obstructive pulmonary disease, unspecified: Secondary | ICD-10-CM | POA: Diagnosis not present

## 2017-04-21 DIAGNOSIS — R0901 Asphyxia: Secondary | ICD-10-CM | POA: Diagnosis not present

## 2017-04-21 DIAGNOSIS — I5043 Acute on chronic combined systolic (congestive) and diastolic (congestive) heart failure: Secondary | ICD-10-CM | POA: Diagnosis not present

## 2017-04-21 DIAGNOSIS — I270 Primary pulmonary hypertension: Secondary | ICD-10-CM | POA: Diagnosis not present

## 2017-04-21 DIAGNOSIS — N2581 Secondary hyperparathyroidism of renal origin: Secondary | ICD-10-CM | POA: Diagnosis not present

## 2017-04-21 DIAGNOSIS — N186 End stage renal disease: Secondary | ICD-10-CM | POA: Diagnosis not present

## 2017-04-21 DIAGNOSIS — R0602 Shortness of breath: Secondary | ICD-10-CM | POA: Diagnosis not present

## 2017-04-22 DIAGNOSIS — E782 Mixed hyperlipidemia: Secondary | ICD-10-CM | POA: Diagnosis not present

## 2017-04-23 DIAGNOSIS — N2581 Secondary hyperparathyroidism of renal origin: Secondary | ICD-10-CM | POA: Diagnosis not present

## 2017-04-23 DIAGNOSIS — N186 End stage renal disease: Secondary | ICD-10-CM | POA: Diagnosis not present

## 2017-04-23 DIAGNOSIS — E782 Mixed hyperlipidemia: Secondary | ICD-10-CM | POA: Diagnosis not present

## 2017-04-23 NOTE — Progress Notes (Signed)
[facility] Note  04/27/2017     CHIEF COMPLAINT Patient presents for Retina Follow Up   HISTORY OF PRESENT ILLNESS: [name] is a 72 y.o. female who presents to the clinic today for:   HPI    Retina Follow Up    Patient presents with  Other.  In left eye.  Severity is moderate.  Duration of 1.  Since onset it is stable.  I, the attending physician,  performed the HPI with the patient and updated documentation appropriately.          Comments    Pt presents today for F/u for hemorragic PVD OS, pt states VA is stable since last week, pt denies flashes, floaters, pain and wavy vision, states it looks like a spider web is over her eye, pts BS was 125 yesterday, pt was unable to see machine to check this morning       Last edited by Bernarda Caffey, MD on 04/27/2017  9:53 AM. (History)    Pt states she is unable to see now, pt states its "much worse";    Referring physician: [name], MD [place], [place] [address]  HISTORICAL INFORMATION:   Selected notes from the MEDICAL RECORD NUMBER Referred by Dr. Delane Ginger. Hensel for possible RT OS;  LEE-  Ocular Hx-  PMH-     CURRENT MEDICATIONS: No current outpatient medications on file. (Ophthalmic Drugs)   No current facility-administered medications for this visit.  (Ophthalmic Drugs)   Current Outpatient Medications (Other)  Medication Sig  . albuterol (PROVENTIL HFA;VENTOLIN HFA) 108 (90 BASE) MCG/ACT inhaler Inhale 2 puffs into the lungs every 4 (four) hours as needed for wheezing or shortness of breath.  Marland Kitchen albuterol (PROVENTIL) (2.5 MG/3ML) 0.083% nebulizer solution Take 3 mLs (2.5 mg total) by nebulization every 2 (two) hours as needed for shortness of breath.  Marland Kitchen aspirin EC 81 MG tablet Take 81 mg by mouth daily.    Marland Kitchen atorvastatin (LIPITOR) 40 MG tablet   . azithromycin (ZITHROMAX) 250 MG tablet One by mouth in AM x one (Patient not taking: Reported on 04/20/2017)  . carvedilol  (COREG) 25 MG tablet Take 0.5 tablets (12.5 mg total) by mouth 2 (two) times daily.  . cinacalcet (SENSIPAR) 90 MG tablet Take 1 tablet (90 mg) by mouth daily. On dialysis days, take the dose after dialysis  . diphenhydrAMINE (BENADRYL) 25 mg capsule Take 50 mg by mouth See admin instructions. Take 2 tablets (50 mg) by mouth prior to dialysis on Tuesday, Thursday, Saturday - for itching  . ethyl chloride spray Apply 1 application topically See admin instructions. Apply topically before dialysis on Tuesday, Thursday, Saturday  . guaiFENesin-dextromethorphan (ROBITUSSIN DM) 100-10 MG/5ML syrup Take 5 mLs by mouth every 4 (four) hours while awake. (Patient not taking: Reported on 04/20/2017)  . HYDROcodone-acetaminophen (NORCO) 5-325 MG tablet Take 1 tablet by mouth every 6 (six) hours as needed for moderate pain.  Marland Kitchen insulin glargine (LANTUS) 100 UNIT/ML injection Inject 0.05 mLs (5 Units total) into the skin at bedtime.  . midodrine (PROAMATINE) 10 MG tablet   . sevelamer carbonate (RENVELA) 800 MG tablet Take 2-3 tablets (1,600-2,400 mg total) by mouth See admin instructions. Take 3 tablets (2,400 mg) by mouth 3 times daily with meals. Take 2 tablets (1,600 mg) by mouth with snacks. Receives supply from dialysis center.  . tiotropium (SPIRIVA) 18 MCG inhalation capsule Place 1 capsule (18 mcg total) into inhaler and inhale  daily.   No current facility-administered medications for this visit.  (Other)      REVIEW OF SYSTEMS: ROS    Positive for: Endocrine, Cardiovascular, Eyes, Allergic/Imm   Negative for: Constitutional, Gastrointestinal, Neurological, Skin, Genitourinary, Musculoskeletal, HENT, Respiratory, Psychiatric, Heme/Lymph   Last edited by Debbrah Alar, COT on 04/27/2017  9:32 AM. (History)       ALLERGIES Allergies  Allergen Reactions  . Lyrica [Pregabalin] Other (See Comments)    Hallucinations    PAST MEDICAL HISTORY Past Medical History:  Diagnosis Date  . Abdominal  abscess 12-17-10   abdominal abscesses x2 ? one at this time  . Anemia   . Arthritis    "all over" (08/23/2012)  . Blind right eye    fell and crushed socket and eye fell out, was replaced at [facility]  . CHF (congestive heart failure) ([place])   . Chronic lower back pain   . Coronary artery disease    normal coronaries by 08/25/12 cath  . Dyspnea   . Dysrhythmia    irregular, "skips beats"  . ESRD (end stage renal disease) on dialysis ([place])    "started 12/2011; [facility]; TTS" (08/23/2012  . Exertional shortness of breath   . Eye drainage    "lots; since fall 07/2011" (08/23/2012)  . Gout   . H/O hiatal hernia   . Headache(784.0)    "weekly sometimes; since I fell and hit my head in 07/2011 08/23/2012)  . Heart murmur   . History of blood transfusion    "lots before starting dialysis" (08/23/2012)  . Hypertension    sees Dr. Willey Blade  . Inhalation injury    "worked at [employer]; can't inhale polyurethane or paint, etc" (08/23/2012)  . Myocardial infarction ([place]) 1970's or 1980's  . Neuromuscular disorder ([place])    diabetic neuropathy  . OSA on CPAP   . Staph aureus infection November 2012   . Swelling of both ankles 12-17-10  . Type II diabetes mellitus ([place])    "over 10 years now" (08/23/2012)   Past Surgical History:  Procedure Laterality Date  . APPENDECTOMY    . AV FISTULA PLACEMENT  09/30/2011   Procedure: ARTERIOVENOUS (AV) FISTULA CREATION;  Surgeon: Conrad Stacyville, MD;  Location: [place];  Service: Vascular;  Laterality: Left;  BRACHIAL-CEPHALIC  . BACK SURGERY    . BASCILIC VEIN TRANSPOSITION Left 11/10/2011   Left arm   . [place] TRANSPOSITION  01/28/2012   Procedure: BASCILIC VEIN TRANSPOSITION;  Surgeon: Conrad Glacier, MD;  Location: [facility];  Service: Vascular;  Laterality: Left;  Second Stage   . CARDIAC CATHETERIZATION  1980's  . CATARACT EXTRACTION W/ INTRAOCULAR LENS IMPLANT Bilateral 1990's  . CORONARY ANGIOGRAPHY N/A 04/07/2017   Procedure: CORONARY ANGIOGRAPHY  (CATH LAB);  Surgeon: Dixie Dials, MD;  Location: [facility];  Service: Cardiovascular;  Laterality: N/A;  . DILATION AND CURETTAGE OF UTERUS  1960's  . EYE SURGERY    . FISTULA SUPERFICIALIZATION Left 02/18/2017   Procedure: SUPERFICIALIZATION LEFT ARM ARTERIOVENOUS FISTULA;  Surgeon: Serafina Mitchell, MD;  Location: [place];  Service: Vascular;  Laterality: Left;  . HERNIA REPAIR    . INSERTION OF DIALYSIS CATHETER  01/05/2012   Procedure: INSERTION OF DIALYSIS CATHETER;  Surgeon: Conrad Chicopee, MD;  Location: [facility];  Service: Vascular;  Laterality: N/A;  Right Internal Jugular Placement  . INTRAOCULAR PROSTHESES INSERTION Right 2013   "fell; knocked my eye outl" (08/23/2012)  . JOINT REPLACEMENT    .  LAPAROTOMY  12/18/2010   Procedure: EXPLORATORY LAPAROTOMY;  Surgeon: Rolm Bookbinder, MD;  Location: [facility];  Service: General;  Laterality: N/A;  Abdominal Seroma Evacuation  . LEFT HEART CATHETERIZATION WITH CORONARY ANGIOGRAM N/A 08/25/2012   Procedure: LEFT HEART CATHETERIZATION WITH CORONARY ANGIOGRAM;  Surgeon: Birdie Riddle, MD;  Location: [facility];  Service: Cardiovascular;  Laterality: N/A;  . LUMBAR DISC SURGERY  1970'-80's   X 3  . TOTAL KNEE ARTHROPLASTY Left 1980's  . VAGINAL HYSTERECTOMY  1970's  . VENTRAL HERNIA REPAIR  2012-2013   component separation, repair with biologic; "had 3 surgeries to fix it" (08/23/2012)  . WOUND DEBRIDEMENT  03/20/2011   Procedure: DEBRIDEMENT ABDOMINAL WOUND;  Surgeon: Rolm Bookbinder, MD;  Location: [facility];  Service: General;  Laterality: N/A;  debridement abdominal wall, placement of wound vac    FAMILY HISTORY Family History  Problem Relation Age of Onset  . Hypertension Mother   . Cancer Brother        spine  . Hypertension Father   . Cancer Sister        brain  . Hypertension Maternal Grandmother   . Anesthesia problems Neg Hx     SOCIAL HISTORY Social History   Tobacco Use  . Smoking status: Former Smoker     Packs/day: 0.25    Years: 16.00    Pack years: 4.00    Types: Cigarettes    Last attempt to quit: 12/16/1980    Years since quitting: 36.3  . Smokeless tobacco: Never Used  Substance Use Topics  . Alcohol use: No  . Drug use: No         OPHTHALMIC EXAM:  Base Eye Exam    Visual Acuity (Snellen - Linear)      Right Left   Dist [place] prosthetic 20/150 -1   Dist ph [place]  NI       Tonometry (Tonopen, 9:39 AM)      Right Left   Pressure pros 12       Pupils      Dark Light Shape React APD   Right prosthetic       Left 3 1.5 Round Slow None       Visual Fields (Counting fingers)      Left Right    Full    Restrictions  Total superior temporal, inferior temporal, superior nasal, inferior nasal deficiencies  Prosthetic OD       Extraocular Movement      Right Left    prosthetic Full       Neuro/Psych    Oriented x3:  Yes   Mood/Affect:  Normal       Dilation    Left eye:  2.5% Phenylephrine, 1.0% Mydriacyl @ 9:39 AM        Slit Lamp and Fundus Exam    Slit Lamp Exam      Right Left   Lids/Lashes  Lid retraction   Conjunctiva/Sclera Prosthetic in good postion Melanosis   Cornea  3+ Punctate epithelial erosions, irregular epithelium   Anterior Chamber  Deep and quiet   Iris  Round and dilated, No NVI   Lens  Three piece PC IOL in good position   Vitreous  Vitreous hemorrhage, Posterior vitreous detachment, Vitreous syneresis; decent red reflex       Fundus Exam      Right Left   Disc  Hazy view, clump of pre-retinal hemorrhage on temporal disc   Macula  hazy view; grossly attached,  large clump of    Vessels  periphery appears normal   Periphery  Hazy view posteriorly; periphery attached without obvious retinal tear 360 scleral depressed exam; Preretinal hemorrhage inferiorly          IMAGING AND PROCEDURES  Imaging and Procedures for 04/27/17  OCT, Retina - OU - Both Eyes       Right Eye Quality was (Prosthetic).   Left Eye Quality was  poor. Central Foveal Thickness: 226. Progression has been stable. Findings include normal foveal contour, no IRF, no SRF (Vitreous opacity slight;y worse).   Notes *Images captured and stored on drive  Diagnosis / Impression:  OD: Prosthetic OS: NFP, No IRF/SRF -- slightly worse hyperreflective vitreous opacities   Clinical management:  See below  Abbreviations: NFP - Normal foveal profile. CME - cystoid macular edema. PED - pigment epithelial detachment. IRF - intraretinal fluid. SRF - subretinal fluid. EZ - ellipsoid zone. ERM - epiretinal membrane. ORA - outer retinal atrophy. ORT - outer retinal tubulation. SRHM - subretinal hyper-reflective material                  ASSESSMENT/PLAN:    ICD-10-CM   1. Posterior vitreous detachment of left eye H43.812   2. Vitreous hemorrhage of left eye (HCC) H43.12   3. Retinal edema H35.81 OCT, Retina - OU - Both Eyes  4. Pseudophakia of left eye Z96.1   5. 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 Hemorrhagic PVD OS-  Heme has shifted over macula and pt with worse vision today  Situation complicated by use of coumadin  Discussed findings and prognosis  No RT or RD on repeat 360 peripheral exam  Reviewed s/s of RT/RD  Strict return precautions for any such RT/RD signs/symptoms  VH precautions reviewed -- minimize activities, keep head elevated, avoid ASA/NSAIDs/blood thinners as able  Will follow closely as monocular -- will also have lower threshold to take for PPV if vision doesn't improve as monocular  F/U Friday  3. No retinal edema on exam or OCT  4. Pseudophakia OS-  - s/p CE/IOL  - beautiful surgery, doing well  - monitor  5. Prosthetic OD-  - prosthetic in good position - traumatic fall w/ open globe repair 2013 (Dr. Lennon Alstrom) --> enucleated by Dr. Toy Cookey - had prosthetic cleaned recently in [facility] - monitor  Ophthalmic Meds Ordered this visit:  No orders of the defined types were placed in this encounter.       Return in about 4 days (around 05/01/2017) for F/U Hemorrhagic PVD OS.  There are no Patient Instructions on file for this visit.   Explained the diagnoses, plan, and follow up with the patient and they expressed understanding.  Patient expressed understanding of the importance of proper follow up care.   This document serves as a record of services personally performed by Gardiner Sleeper, MD, PhD. It was created on their behalf by Catha Brow, [place], a certified ophthalmic assistant. The creation of this record is the provider's dictation and/or activities during the visit.  Electronically signed by: Catha Brow, COA  04/27/17 4:45 PM   Gardiner Sleeper, M.D., Ph.D. Diseases & Surgery of the Retina and [place] 04/27/17  I have reviewed the above documentation for accuracy and completeness, and I agree with the above. Gardiner Sleeper, M.D., Ph.D. 04/27/17 4:45 PM    Abbreviations: M myopia (nearsighted); A astigmatism; H hyperopia (farsighted); P presbyopia; Mrx spectacle prescription;  CTL contact lenses; OD right eye; OS left eye; OU both eyes  XT exotropia; ET esotropia; PEK punctate epithelial keratitis; PEE punctate epithelial erosions; DES dry eye syndrome; MGD meibomian gland dysfunction; ATs artificial tears; PFAT's preservative free artificial tears; [place] nuclear sclerotic cataract; PSC posterior subcapsular cataract; ERM epi-retinal membrane; PVD posterior vitreous detachment; RD retinal detachment; DM diabetes mellitus; DR diabetic retinopathy; NPDR non-proliferative diabetic retinopathy; PDR proliferative diabetic retinopathy; CSME clinically significant macular edema; DME diabetic macular edema; dbh dot blot hemorrhages; CWS cotton wool spot; POAG primary open angle glaucoma; C/D cup-to-disc ratio; HVF humphrey visual field; GVF goldmann visual field; OCT optical coherence tomography; IOP intraocular pressure; BRVO Branch retinal vein  occlusion; CRVO central retinal vein occlusion; CRAO central retinal artery occlusion; BRAO branch retinal artery occlusion; RT retinal tear; SB scleral buckle; PPV pars plana vitrectomy; VH Vitreous hemorrhage; PRP panretinal laser photocoagulation; IVK intravitreal kenalog; VMT vitreomacular traction; MH Macular hole;  NVD neovascularization of the disc; NVE neovascularization elsewhere; AREDS age related eye disease study; ARMD age related macular degeneration; POAG primary open angle glaucoma; EBMD epithelial/anterior basement membrane dystrophy; ACIOL anterior chamber intraocular lens; IOL intraocular lens; PCIOL posterior chamber intraocular lens; Phaco/IOL phacoemulsification with intraocular lens placement; [place] photorefractive keratectomy; LASIK laser assisted in situ keratomileusis; HTN hypertension; DM diabetes mellitus; COPD chronic obstructive pulmonary disease

## 2017-04-24 DIAGNOSIS — E782 Mixed hyperlipidemia: Secondary | ICD-10-CM | POA: Diagnosis not present

## 2017-04-25 DIAGNOSIS — N2581 Secondary hyperparathyroidism of renal origin: Secondary | ICD-10-CM | POA: Diagnosis not present

## 2017-04-25 DIAGNOSIS — N186 End stage renal disease: Secondary | ICD-10-CM | POA: Diagnosis not present

## 2017-04-27 ENCOUNTER — Encounter (INDEPENDENT_AMBULATORY_CARE_PROVIDER_SITE_OTHER): Payer: Self-pay | Admitting: Ophthalmology

## 2017-04-27 ENCOUNTER — Ambulatory Visit (INDEPENDENT_AMBULATORY_CARE_PROVIDER_SITE_OTHER): Payer: Medicare HMO | Admitting: Ophthalmology

## 2017-04-27 DIAGNOSIS — Z970 Presence of artificial eye: Secondary | ICD-10-CM | POA: Diagnosis not present

## 2017-04-27 DIAGNOSIS — H3581 Retinal edema: Secondary | ICD-10-CM | POA: Diagnosis not present

## 2017-04-27 DIAGNOSIS — H4312 Vitreous hemorrhage, left eye: Secondary | ICD-10-CM | POA: Diagnosis not present

## 2017-04-27 DIAGNOSIS — Z961 Presence of intraocular lens: Secondary | ICD-10-CM | POA: Diagnosis not present

## 2017-04-27 DIAGNOSIS — E782 Mixed hyperlipidemia: Secondary | ICD-10-CM | POA: Diagnosis not present

## 2017-04-27 DIAGNOSIS — H43812 Vitreous degeneration, left eye: Secondary | ICD-10-CM

## 2017-04-28 DIAGNOSIS — N186 End stage renal disease: Secondary | ICD-10-CM | POA: Diagnosis not present

## 2017-04-28 DIAGNOSIS — E782 Mixed hyperlipidemia: Secondary | ICD-10-CM | POA: Diagnosis not present

## 2017-04-28 DIAGNOSIS — N2581 Secondary hyperparathyroidism of renal origin: Secondary | ICD-10-CM | POA: Diagnosis not present

## 2017-04-29 DIAGNOSIS — E782 Mixed hyperlipidemia: Secondary | ICD-10-CM | POA: Diagnosis not present

## 2017-04-30 DIAGNOSIS — N186 End stage renal disease: Secondary | ICD-10-CM | POA: Diagnosis not present

## 2017-04-30 DIAGNOSIS — N2581 Secondary hyperparathyroidism of renal origin: Secondary | ICD-10-CM | POA: Diagnosis not present

## 2017-04-30 DIAGNOSIS — E782 Mixed hyperlipidemia: Secondary | ICD-10-CM | POA: Diagnosis not present

## 2017-04-30 NOTE — Progress Notes (Signed)
[facility] Note  05/01/2017     CHIEF COMPLAINT Patient presents for Retina Follow Up   HISTORY OF PRESENT ILLNESS: [name] is a 72 y.o. female who presents to the clinic today for:   HPI    Retina Follow Up    Patient presents with  PVD.  In left eye.  This started 11 days ago.  Severity is moderate.  Duration of 11 days.  Since onset it is gradually improving.  I, the attending physician,  performed the HPI with the patient and updated documentation appropriately.          Comments    72 y/o female pt here for f/u for vitreous hemorrhage os.  No change in vision ou since last visit.  Denies pain, flashes, floaters.  No gtts.       Last edited by Bernarda Caffey, MD on 05/03/2017 10:42 PM. (History)    Pt states OS is no better;    Referring physician: [name], MD [address], [place] [address]  HISTORICAL INFORMATION:   Selected notes from the MEDICAL RECORD NUMBER Referred by Dr. Delane Ginger. Hensel for possible RT OS;  LEE-  Ocular Hx-  PMH-     CURRENT MEDICATIONS: No current outpatient medications on file. (Ophthalmic Drugs)   No current facility-administered medications for this visit.  (Ophthalmic Drugs)   Current Outpatient Medications (Other)  Medication Sig  . albuterol (PROVENTIL HFA;VENTOLIN HFA) 108 (90 BASE) MCG/ACT inhaler Inhale 2 puffs into the lungs every 4 (four) hours as needed for wheezing or shortness of breath.  Marland Kitchen albuterol (PROVENTIL) (2.5 MG/3ML) 0.083% nebulizer solution Take 3 mLs (2.5 mg total) by nebulization every 2 (two) hours as needed for shortness of breath.  Marland Kitchen aspirin EC 81 MG tablet Take 81 mg by mouth daily.    Marland Kitchen atorvastatin (LIPITOR) 40 MG tablet   . azithromycin (ZITHROMAX) 250 MG tablet One by mouth in AM x one (Patient not taking: Reported on 04/20/2017)  . carvedilol (COREG) 25 MG tablet Take 0.5 tablets (12.5 mg total) by mouth 2 (two) times daily.  . cinacalcet (SENSIPAR) 90 MG  tablet Take 1 tablet (90 mg) by mouth daily. On dialysis days, take the dose after dialysis  . diphenhydrAMINE (BENADRYL) 25 mg capsule Take 50 mg by mouth See admin instructions. Take 2 tablets (50 mg) by mouth prior to dialysis on Tuesday, Thursday, Saturday - for itching  . ethyl chloride spray Apply 1 application topically See admin instructions. Apply topically before dialysis on Tuesday, Thursday, Saturday  . guaiFENesin-dextromethorphan (ROBITUSSIN DM) 100-10 MG/5ML syrup Take 5 mLs by mouth every 4 (four) hours while awake. (Patient not taking: Reported on 04/20/2017)  . HYDROcodone-acetaminophen (NORCO) 5-325 MG tablet Take 1 tablet by mouth every 6 (six) hours as needed for moderate pain.  Marland Kitchen insulin glargine (LANTUS) 100 UNIT/ML injection Inject 0.05 mLs (5 Units total) into the skin at bedtime.  . midodrine (PROAMATINE) 10 MG tablet   . sevelamer carbonate (RENVELA) 800 MG tablet Take 2-3 tablets (1,600-2,400 mg total) by mouth See admin instructions. Take 3 tablets (2,400 mg) by mouth 3 times daily with meals. Take 2 tablets (1,600 mg) by mouth with snacks. Receives supply from dialysis center.  . tiotropium (SPIRIVA) 18 MCG inhalation capsule Place 1 capsule (18 mcg total) into inhaler and inhale daily.   No current facility-administered medications for this visit.  (Other)      REVIEW OF SYSTEMS: ROS  Positive for: Eyes   Negative for: Constitutional, Gastrointestinal, Neurological, Skin, Genitourinary, Musculoskeletal, HENT, Endocrine, Cardiovascular, Respiratory, Psychiatric, Allergic/Imm, Heme/Lymph   Last edited by Matthew Folks, COA on 05/01/2017  8:41 AM. (History)       ALLERGIES Allergies  Allergen Reactions  . Lyrica [Pregabalin] Other (See Comments)    Hallucinations    PAST MEDICAL HISTORY Past Medical History:  Diagnosis Date  . Abdominal abscess 12-17-10   abdominal abscesses x2 ? one at this time  . Anemia   . Arthritis    "all over" (08/23/2012)  .  Blind right eye    fell and crushed socket and eye fell out, was replaced at [facility]  . CHF (congestive heart failure) ([place])   . Chronic lower back pain   . Coronary artery disease    normal coronaries by 08/25/12 cath  . Dyspnea   . Dysrhythmia    irregular, "skips beats"  . ESRD (end stage renal disease) on dialysis ([place])    "started 12/2011; [place]; TTS" (08/23/2012  . Exertional shortness of breath   . Eye drainage    "lots; since fall 07/2011" (08/23/2012)  . Gout   . H/O hiatal hernia   . Headache(784.0)    "weekly sometimes; since I fell and hit my head in 07/2011 08/23/2012)  . Heart murmur   . History of blood transfusion    "lots before starting dialysis" (08/23/2012)  . Hypertension    sees Dr. Willey Blade  . Inhalation injury    "worked at [employer]; can't inhale polyurethane or paint, etc" (08/23/2012)  . Myocardial infarction ([place]) 1970's or 1980's  . Neuromuscular disorder ([place])    diabetic neuropathy  . OSA on CPAP   . Staph aureus infection November 2012   . Swelling of both ankles 12-17-10  . Type II diabetes mellitus ([place])    "over 10 years now" (08/23/2012)   Past Surgical History:  Procedure Laterality Date  . APPENDECTOMY    . AV FISTULA PLACEMENT  09/30/2011   Procedure: ARTERIOVENOUS (AV) FISTULA CREATION;  Surgeon: Conrad Kennett, MD;  Location: [place];  Service: Vascular;  Laterality: Left;  BRACHIAL-CEPHALIC  . BACK SURGERY    . BASCILIC VEIN TRANSPOSITION Left 11/10/2011   Left arm   . [place] TRANSPOSITION  01/28/2012   Procedure: BASCILIC VEIN TRANSPOSITION;  Surgeon: Conrad Senatobia, MD;  Location: [place];  Service: Vascular;  Laterality: Left;  Second Stage   . CARDIAC CATHETERIZATION  1980's  . CATARACT EXTRACTION W/ INTRAOCULAR LENS IMPLANT Bilateral 1990's  . CORONARY ANGIOGRAPHY N/A 04/07/2017   Procedure: CORONARY ANGIOGRAPHY (CATH LAB);  Surgeon: Dixie Dials, MD;  Location: [place] CV LAB;  Service: Cardiovascular;  Laterality: N/A;   . DILATION AND CURETTAGE OF UTERUS  1960's  . EYE SURGERY    . FISTULA SUPERFICIALIZATION Left 02/18/2017   Procedure: SUPERFICIALIZATION LEFT ARM ARTERIOVENOUS FISTULA;  Surgeon: Serafina Mitchell, MD;  Location: [place];  Service: Vascular;  Laterality: Left;  . HERNIA REPAIR    . INSERTION OF DIALYSIS CATHETER  01/05/2012   Procedure: INSERTION OF DIALYSIS CATHETER;  Surgeon: Conrad Rock Port, MD;  Location: [place];  Service: Vascular;  Laterality: N/A;  Right Internal Jugular Placement  . INTRAOCULAR PROSTHESES INSERTION Right 2013   "fell; knocked my eye outl" (08/23/2012)  . JOINT REPLACEMENT    . LAPAROTOMY  12/18/2010   Procedure: EXPLORATORY LAPAROTOMY;  Surgeon: Rolm Bookbinder, MD;  [facility];  Service: General;  Laterality: N/A;  Abdominal Seroma Evacuation  . LEFT HEART CATHETERIZATION WITH CORONARY ANGIOGRAM N/A 08/25/2012   Procedure: LEFT HEART CATHETERIZATION WITH CORONARY ANGIOGRAM;  Surgeon: Birdie Riddle, MD;  Location: [place] CATH LAB;  Service: Cardiovascular;  Laterality: N/A;  . LUMBAR DISC SURGERY  1970'-80's   X 3  . TOTAL KNEE ARTHROPLASTY Left 1980's  . VAGINAL HYSTERECTOMY  1970's  . VENTRAL HERNIA REPAIR  2012-2013   component separation, repair with biologic; "had 3 surgeries to fix it" (08/23/2012)  . WOUND DEBRIDEMENT  03/20/2011   Procedure: DEBRIDEMENT ABDOMINAL WOUND;  Surgeon: Rolm Bookbinder, MD;  Location: [facility];  Service: General;  Laterality: N/A;  debridement abdominal wall, placement of wound vac    FAMILY HISTORY Family History  Problem Relation Age of Onset  . Hypertension Mother   . Cancer Brother        spine  . Hypertension Father   . Cancer Sister        brain  . Hypertension Maternal Grandmother   . Anesthesia problems Neg Hx     SOCIAL HISTORY Social History   Tobacco Use  . Smoking status: Former Smoker    Packs/day: 0.25    Years: 16.00    Pack years: 4.00    Types: Cigarettes    Last attempt to quit: 12/16/1980    Years  since quitting: 36.4  . Smokeless tobacco: Never Used  Substance Use Topics  . Alcohol use: No  . Drug use: No         OPHTHALMIC EXAM:  Base Eye Exam    Visual Acuity (Snellen - Linear)      Right Left   Dist [place] Prosthetic 20/100   Dist ph [place]  20/100       Tonometry (Tonopen, 8:38 AM)      Right Left   Pressure  9  Prosthetic OD       Pupils      Dark Light Shape React APD   Right        Left 3 1.5 Round Slow None  Prosthetic OD       Visual Fields (Counting fingers)      Left Right    Full   Prosthetic OD       Extraocular Movement      Right Left    Full Full       Neuro/Psych    Oriented x3:  Yes   Mood/Affect:  Normal       Dilation    Left eye:  1.0% Mydriacyl, 2.5% Phenylephrine @ 8:39 AM        Slit Lamp and Fundus Exam    Slit Lamp Exam      Right Left   Lids/Lashes  Lid retraction   Conjunctiva/Sclera Prosthetic in good postion Melanosis   Cornea  3+ Punctate epithelial erosions, irregular epithelium   Anterior Chamber  Deep and quiet   Iris  Round and dilated, No NVI   Lens  Three piece PC IOL in good position   Vitreous  Vitreous hemorrhage, Posterior vitreous detachment, Vitreous syneresis; decent red reflex       Fundus Exam      Right Left   Disc  Hazy view, clump of pre-retinal hemorrhage on temporal disc   Macula  hazy view; obscured by central VH; grossly attached,    Vessels  periphery appears normal   Periphery  Hazy view posteriorly improving; periphery attached without obvious retinal tear on 360 scleral depressed  exam;  Large reretinal hemorrhage inferiorly          IMAGING AND PROCEDURES  Imaging and Procedures for 05/03/17  OCT, Retina - OU - Both Eyes       Right Eye Quality was (Prosthetic).   Left Eye Quality was poor. Central Foveal Thickness: (Unable to obtain). Progression has been stable. Findings include normal foveal contour, no IRF, no SRF (Persistent Vitreous opacity).   Notes *Images  captured and stored on drive  Diagnosis / Impression:  OD: Prosthetic OS: NFP, No IRF/SRF -- slightly worse hyperreflective vitreous opacities   Clinical management:  See below  Abbreviations: NFP - Normal foveal profile. CME - cystoid macular edema. PED - pigment epithelial detachment. IRF - intraretinal fluid. SRF - subretinal fluid. EZ - ellipsoid zone. ERM - epiretinal membrane. ORA - outer retinal atrophy. ORT - outer retinal tubulation. SRHM - subretinal hyper-reflective material                  ASSESSMENT/PLAN:    ICD-10-CM   1. Vitreous hemorrhage of left eye (HCC) H43.12   2. Posterior vitreous detachment of left eye H43.812   3. Retinal edema H35.81 OCT, Retina - OU - Both Eyes  4. Pseudophakia of left eye Z96.1   5. 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 Vitreous hemorrhage, suspect Hemorrhagic PVD OS-  Dense VH remains over macula and vision remains poor  Situation complicated by use of coumadin  Discussed findings and prognosis  No obvious RT or RD on repeat 360 peripheral exam  Discussed treatment options: surgery vs observation  As monocular, pt wishes to proceed with surgery -- PPV w/ laser OS under general anesthesia  Case scheduled for next Thursday, [facility] OR  Pt will adjust renal dialysis schedule (usually Tues, Thurs, Sat)  VH precautions reviewed -- minimize activities, keep head elevated, avoid ASA/NSAIDs/blood thinners as able    3. No retinal edema on exam or OCT  4. Pseudophakia OS-  - s/p CE/IOL  - beautiful surgery, doing well  - monitor  5. Prosthetic OD-  - prosthetic in good position - traumatic fall w/ open globe repair 2013 (Dr. Lennon Alstrom) --> enucleated by Dr. Toy Cookey - had prosthetic cleaned recently in [facility] - stable  Ophthalmic Meds Ordered this visit:  No orders of the defined types were placed in this encounter.      Return in about 1 week (around 05/08/2017) for POV.  There are no Patient Instructions on file for this  visit.   Explained the diagnoses, plan, and follow up with the patient and they expressed understanding.  Patient expressed understanding of the importance of proper follow up care.   This document serves as a record of services personally performed by Gardiner Sleeper, MD, PhD. It was created on their behalf by Catha Brow, [place], a certified ophthalmic assistant. The creation of this record is the provider's dictation and/or activities during the visit.  Electronically signed by: Catha Brow, [place]  05/03/17 11:06 PM   Gardiner Sleeper, M.D., Ph.D. Diseases & Surgery of the Retina and [place] 05/03/17  I have reviewed the above documentation for accuracy and completeness, and I agree with the above. Gardiner Sleeper, M.D., Ph.D. 05/03/17 11:06 PM    Abbreviations: M myopia (nearsighted); A astigmatism; H hyperopia (farsighted); P presbyopia; Mrx spectacle prescription;  CTL contact lenses; OD right eye; OS left eye; OU both eyes  XT exotropia; ET esotropia; PEK  punctate epithelial keratitis; PEE punctate epithelial erosions; DES dry eye syndrome; MGD meibomian gland dysfunction; ATs artificial tears; PFAT's preservative free artificial tears; [place] nuclear sclerotic cataract; PSC posterior subcapsular cataract; ERM epi-retinal membrane; PVD posterior vitreous detachment; RD retinal detachment; DM diabetes mellitus; DR diabetic retinopathy; NPDR non-proliferative diabetic retinopathy; PDR proliferative diabetic retinopathy; CSME clinically significant macular edema; DME diabetic macular edema; dbh dot blot hemorrhages; CWS cotton wool spot; POAG primary open angle glaucoma; C/D cup-to-disc ratio; HVF humphrey visual field; GVF goldmann visual field; OCT optical coherence tomography; IOP intraocular pressure; BRVO Branch retinal vein occlusion; CRVO central retinal vein occlusion; CRAO central retinal artery occlusion; BRAO branch retinal artery occlusion; RT  retinal tear; SB scleral buckle; PPV pars plana vitrectomy; VH Vitreous hemorrhage; PRP panretinal laser photocoagulation; IVK intravitreal kenalog; VMT vitreomacular traction; MH Macular hole;  NVD neovascularization of the disc; NVE neovascularization elsewhere; AREDS age related eye disease study; ARMD age related macular degeneration; POAG primary open angle glaucoma; EBMD epithelial/anterior basement membrane dystrophy; ACIOL anterior chamber intraocular lens; IOL intraocular lens; PCIOL posterior chamber intraocular lens; Phaco/IOL phacoemulsification with intraocular lens placement; [place] photorefractive keratectomy; LASIK laser assisted in situ keratomileusis; HTN hypertension; DM diabetes mellitus; COPD chronic obstructive pulmonary disease

## 2017-05-01 ENCOUNTER — Encounter (INDEPENDENT_AMBULATORY_CARE_PROVIDER_SITE_OTHER): Payer: Self-pay | Admitting: Ophthalmology

## 2017-05-01 ENCOUNTER — Ambulatory Visit (INDEPENDENT_AMBULATORY_CARE_PROVIDER_SITE_OTHER): Payer: Medicare HMO | Admitting: Ophthalmology

## 2017-05-01 DIAGNOSIS — H3581 Retinal edema: Secondary | ICD-10-CM | POA: Diagnosis not present

## 2017-05-01 DIAGNOSIS — H4312 Vitreous hemorrhage, left eye: Secondary | ICD-10-CM | POA: Diagnosis not present

## 2017-05-01 DIAGNOSIS — Z961 Presence of intraocular lens: Secondary | ICD-10-CM

## 2017-05-01 DIAGNOSIS — H43812 Vitreous degeneration, left eye: Secondary | ICD-10-CM | POA: Diagnosis not present

## 2017-05-01 DIAGNOSIS — Z970 Presence of artificial eye: Secondary | ICD-10-CM

## 2017-05-02 DIAGNOSIS — N2581 Secondary hyperparathyroidism of renal origin: Secondary | ICD-10-CM | POA: Diagnosis not present

## 2017-05-02 DIAGNOSIS — N186 End stage renal disease: Secondary | ICD-10-CM | POA: Diagnosis not present

## 2017-05-03 ENCOUNTER — Encounter (INDEPENDENT_AMBULATORY_CARE_PROVIDER_SITE_OTHER): Payer: Self-pay | Admitting: Ophthalmology

## 2017-05-03 NOTE — H&P (Signed)
[name] is an 72 y.o. female.    Chief Complaint: Decreased vision in only eye secondary to vitreous hemorrhage OS  HPI: 72 yo monocular patient w/ persistent vitreous hemorrhage, OS.  Past Medical History:  Diagnosis Date  . Abdominal abscess 12-17-10   abdominal abscesses x2 ? one at this time  . Anemia   . Arthritis    "all over" (08/23/2012)  . Blind right eye    fell and crushed socket and eye fell out, was replaced at [facility]  . CHF (congestive heart failure) ([place])   . Chronic lower back pain   . Coronary artery disease    normal coronaries by 08/25/12 cath  . Dyspnea   . Dysrhythmia    irregular, "skips beats"  . ESRD (end stage renal disease) on dialysis ([place])    "started 12/2011; [place]; TTS" (08/23/2012  . Exertional shortness of breath   . Eye drainage    "lots; since fall 07/2011" (08/23/2012)  . Gout   . H/O hiatal hernia   . Headache(784.0)    "weekly sometimes; since I fell and hit my head in 07/2011 08/23/2012)  . Heart murmur   . History of blood transfusion    "lots before starting dialysis" (08/23/2012)  . Hypertension    sees Dr. Willey Blade  . Inhalation injury    "worked at [employer]; can't inhale polyurethane or paint, etc" (08/23/2012)  . Myocardial infarction ([place]) 1970's or 1980's  . Neuromuscular disorder ([place])    diabetic neuropathy  . OSA on CPAP   . Staph aureus infection November 2012   . Swelling of both ankles 12-17-10  . Type II diabetes mellitus ([place])    "over 10 years now" (08/23/2012)    Past Surgical History:  Procedure Laterality Date  . APPENDECTOMY    . AV FISTULA PLACEMENT  09/30/2011   Procedure: ARTERIOVENOUS (AV) FISTULA CREATION;  Surgeon: Conrad Mesick, MD;  Location: [place];  Service: Vascular;  Laterality: Left;  BRACHIAL-CEPHALIC  . BACK SURGERY    . BASCILIC VEIN TRANSPOSITION Left 11/10/2011   Left arm   . [place] TRANSPOSITION  01/28/2012   Procedure: BASCILIC VEIN TRANSPOSITION;  Surgeon: Conrad Shaniko,  MD;  Location: [place];  Service: Vascular;  Laterality: Left;  Second Stage   . CARDIAC CATHETERIZATION  1980's  . CATARACT EXTRACTION W/ INTRAOCULAR LENS IMPLANT Bilateral 1990's  . CORONARY ANGIOGRAPHY N/A 04/07/2017   Procedure: CORONARY ANGIOGRAPHY (CATH LAB);  Surgeon: Dixie Dials, MD;  Location: [place] CV LAB;  Service: Cardiovascular;  Laterality: N/A;  . DILATION AND CURETTAGE OF UTERUS  1960's  . EYE SURGERY    . FISTULA SUPERFICIALIZATION Left 02/18/2017   Procedure: SUPERFICIALIZATION LEFT ARM ARTERIOVENOUS FISTULA;  Surgeon: Serafina Mitchell, MD;  Location: [place];  Service: Vascular;  Laterality: Left;  . HERNIA REPAIR    . INSERTION OF DIALYSIS CATHETER  01/05/2012   Procedure: INSERTION OF DIALYSIS CATHETER;  Surgeon: Conrad , MD;  Location: [place];  Service: Vascular;  Laterality: N/A;  Right Internal Jugular Placement  . INTRAOCULAR PROSTHESES INSERTION Right 2013   "fell; knocked my eye outl" (08/23/2012)  . JOINT REPLACEMENT    . LAPAROTOMY  12/18/2010   Procedure: EXPLORATORY LAPAROTOMY;  Surgeon: Rolm Bookbinder, MD;  [facility];  Service: General;  Laterality: N/A;  Abdominal Seroma Evacuation  . LEFT HEART CATHETERIZATION WITH CORONARY ANGIOGRAM N/A 08/25/2012   Procedure: LEFT HEART CATHETERIZATION WITH CORONARY ANGIOGRAM;  Surgeon:  Birdie Riddle, MD;  Location: [place] CATH LAB;  Service: Cardiovascular;  Laterality: N/A;  . LUMBAR DISC SURGERY  1970'-80's   X 3  . TOTAL KNEE ARTHROPLASTY Left 1980's  . VAGINAL HYSTERECTOMY  1970's  . VENTRAL HERNIA REPAIR  2012-2013   component separation, repair with biologic; "had 3 surgeries to fix it" (08/23/2012)  . WOUND DEBRIDEMENT  03/20/2011   Procedure: DEBRIDEMENT ABDOMINAL WOUND;  Surgeon: Rolm Bookbinder, MD;  Location: [place];  Service: General;  Laterality: N/A;  debridement abdominal wall, placement of wound vac    Family History  Problem Relation Age of Onset  . Hypertension Mother   . Cancer Brother         spine  . Hypertension Father   . Cancer Sister        brain  . Hypertension Maternal Grandmother   . Anesthesia problems Neg Hx    Social History:  reports that she quit smoking about 36 years ago. Her smoking use included cigarettes. She has a 4.00 pack-year smoking history. She has never used smokeless tobacco. She reports that she does not drink alcohol or use drugs.  Allergies:  Allergies  Allergen Reactions  . Lyrica [Pregabalin] Other (See Comments)    Hallucinations    No medications prior to admission.    Review of systems otherwise negative  There were no vitals taken for this visit.  Physical exam: Mental status: oriented x3. Eyes: See eye exam associated with this date of surgery Ears, Nose, Throat: within normal limits Neck: Within Normal limits General: within normal limits Chest: Within normal limits Breast: deferred Heart: Within normal limits Abdomen: Within normal limits GU: deferred Extremities: within normal limits Skin: within normal limits  Assessment/Plan 1. Monocular patient with persistent vitreous hemorrhage and decreased vision OS 2. pseudophakia OS  Plan: To [facility] for 25g PPV w/ laser OS under general anesthesia - case scheduled for Thursday, 4.18.19  Gardiner Sleeper, M.D., Ph.D. Vitreoretinal Surgeon [facility]

## 2017-05-05 DIAGNOSIS — N2581 Secondary hyperparathyroidism of renal origin: Secondary | ICD-10-CM | POA: Diagnosis not present

## 2017-05-05 DIAGNOSIS — N186 End stage renal disease: Secondary | ICD-10-CM | POA: Diagnosis not present

## 2017-05-06 ENCOUNTER — Encounter (HOSPITAL_COMMUNITY): Payer: Self-pay | Admitting: *Deleted

## 2017-05-06 DIAGNOSIS — N186 End stage renal disease: Secondary | ICD-10-CM | POA: Diagnosis not present

## 2017-05-06 DIAGNOSIS — N2581 Secondary hyperparathyroidism of renal origin: Secondary | ICD-10-CM | POA: Diagnosis not present

## 2017-05-06 NOTE — Progress Notes (Signed)
Attempted numerous times to contact pt. Only number we had was a home #. It just rang and then a recording stating to call back at another time. I did call Abundio Miu, one of pt's emergency contacts and she gave me a cell phone #. Pt did not answer and I left a message for a return call. Pt has not called back so I left pre-op instructions on her voicemail. I did call Abundio Miu back and let her know that I did not reach pt. She had told me she would go and check on pt.

## 2017-05-07 ENCOUNTER — Other Ambulatory Visit: Payer: Self-pay

## 2017-05-07 ENCOUNTER — Encounter (HOSPITAL_COMMUNITY)
Admission: RE | Disposition: A | Discharge status: Home / Self Care | Payer: Self-pay | Source: Ambulatory Visit | Attending: Ophthalmology

## 2017-05-07 ENCOUNTER — Ambulatory Visit (HOSPITAL_COMMUNITY): Payer: Medicare HMO | Admitting: Anesthesiology

## 2017-05-07 ENCOUNTER — Encounter (HOSPITAL_COMMUNITY): Payer: Self-pay | Admitting: *Deleted

## 2017-05-07 ENCOUNTER — Ambulatory Visit (HOSPITAL_COMMUNITY)
Admission: RE | Admit: 2017-05-07 | Discharge: 2017-05-07 | Disposition: A | Discharge status: Home / Self Care | Payer: Medicare HMO | Source: Ambulatory Visit | Attending: Ophthalmology | Admitting: Ophthalmology

## 2017-05-07 DIAGNOSIS — Z992 Dependence on renal dialysis: Secondary | ICD-10-CM | POA: Diagnosis not present

## 2017-05-07 DIAGNOSIS — G4733 Obstructive sleep apnea (adult) (pediatric): Secondary | ICD-10-CM | POA: Insufficient documentation

## 2017-05-07 DIAGNOSIS — H35032 Hypertensive retinopathy, left eye: Secondary | ICD-10-CM | POA: Insufficient documentation

## 2017-05-07 DIAGNOSIS — R011 Cardiac murmur, unspecified: Secondary | ICD-10-CM | POA: Insufficient documentation

## 2017-05-07 DIAGNOSIS — I132 Hypertensive heart and chronic kidney disease with heart failure and with stage 5 chronic kidney disease, or end stage renal disease: Secondary | ICD-10-CM | POA: Insufficient documentation

## 2017-05-07 DIAGNOSIS — N186 End stage renal disease: Secondary | ICD-10-CM | POA: Insufficient documentation

## 2017-05-07 DIAGNOSIS — I10 Essential (primary) hypertension: Secondary | ICD-10-CM | POA: Diagnosis not present

## 2017-05-07 DIAGNOSIS — I252 Old myocardial infarction: Secondary | ICD-10-CM | POA: Diagnosis not present

## 2017-05-07 DIAGNOSIS — I509 Heart failure, unspecified: Secondary | ICD-10-CM | POA: Diagnosis not present

## 2017-05-07 DIAGNOSIS — Z87891 Personal history of nicotine dependence: Secondary | ICD-10-CM | POA: Diagnosis not present

## 2017-05-07 DIAGNOSIS — E1140 Type 2 diabetes mellitus with diabetic neuropathy, unspecified: Secondary | ICD-10-CM | POA: Diagnosis not present

## 2017-05-07 DIAGNOSIS — Z6841 Body Mass Index (BMI) 40.0 and over, adult: Secondary | ICD-10-CM | POA: Diagnosis not present

## 2017-05-07 DIAGNOSIS — E1122 Type 2 diabetes mellitus with diabetic chronic kidney disease: Secondary | ICD-10-CM | POA: Diagnosis not present

## 2017-05-07 DIAGNOSIS — E11319 Type 2 diabetes mellitus with unspecified diabetic retinopathy without macular edema: Secondary | ICD-10-CM | POA: Diagnosis not present

## 2017-05-07 DIAGNOSIS — M1990 Unspecified osteoarthritis, unspecified site: Secondary | ICD-10-CM | POA: Insufficient documentation

## 2017-05-07 DIAGNOSIS — Z888 Allergy status to other drugs, medicaments and biological substances status: Secondary | ICD-10-CM | POA: Diagnosis not present

## 2017-05-07 DIAGNOSIS — I2510 Atherosclerotic heart disease of native coronary artery without angina pectoris: Secondary | ICD-10-CM | POA: Diagnosis not present

## 2017-05-07 DIAGNOSIS — M109 Gout, unspecified: Secondary | ICD-10-CM | POA: Insufficient documentation

## 2017-05-07 DIAGNOSIS — Z9989 Dependence on other enabling machines and devices: Secondary | ICD-10-CM | POA: Insufficient documentation

## 2017-05-07 DIAGNOSIS — Z794 Long term (current) use of insulin: Secondary | ICD-10-CM | POA: Diagnosis not present

## 2017-05-07 DIAGNOSIS — H4312 Vitreous hemorrhage, left eye: Secondary | ICD-10-CM | POA: Diagnosis not present

## 2017-05-07 HISTORY — PX: 25 GAUGE PARS PLANA VITRECTOMY WITH 20 GAUGE MVR PORT: SHX6041

## 2017-05-07 LAB — BASIC METABOLIC PANEL
Anion gap: 13 (ref 5–15)
BUN: 33 mg/dL — ABNORMAL HIGH (ref 6–20)
CO2: 28 mmol/L (ref 22–32)
CREATININE: 7 mg/dL — AB (ref 0.44–1.00)
Calcium: 8.6 mg/dL — ABNORMAL LOW (ref 8.9–10.3)
Chloride: 99 mmol/L — ABNORMAL LOW (ref 101–111)
GFR calc non Af Amer: 5 mL/min — ABNORMAL LOW (ref >60)
GFR, EST AFRICAN AMERICAN: 6 mL/min — AB (ref >60)
Glucose, Bld: 97 mg/dL (ref 65–99)
POTASSIUM: 4.7 mmol/L (ref 3.5–5.1)
SODIUM: 140 mmol/L (ref 135–145)

## 2017-05-07 LAB — GLUCOSE, CAPILLARY
Glucose-Capillary: 92 mg/dL (ref 65–99)
Glucose-Capillary: 89 mg/dL (ref 65–99)
Glucose-Capillary: 82 mg/dL (ref 65–99)
Glucose-Capillary: 84 mg/dL (ref 65–99)

## 2017-05-07 LAB — CBC
HCT: 38 % (ref 36.0–46.0)
Hemoglobin: 11.5 g/dL — ABNORMAL LOW (ref 12.0–15.0)
MCH: 28 pg (ref 26.0–34.0)
MCHC: 30.3 g/dL (ref 30.0–36.0)
MCV: 92.5 fL (ref 78.0–100.0)
PLATELETS: 208 10*3/uL (ref 150–400)
RBC: 4.11 MIL/uL (ref 3.87–5.11)
RDW: 20.3 % — ABNORMAL HIGH (ref 11.5–15.5)
WBC: 6.1 10*3/uL (ref 4.0–10.5)

## 2017-05-07 SURGERY — 25 GAUGE PARS PLANA VITRECTOMY WITH 20 GAUGE MVR PORT
Anesthesia: General | Site: Eye | Laterality: Left

## 2017-05-07 MED ORDER — CYCLOPENTOLATE HCL 1 % OP SOLN
OPHTHALMIC | Status: AC
  Administered 2017-05-07: 1 [drp] via OPHTHALMIC
  Filled 2017-05-07: qty 2

## 2017-05-07 MED ORDER — PROPOFOL 10 MG/ML IV BOLUS
INTRAVENOUS | Status: AC
  Filled 2017-05-07: qty 20

## 2017-05-07 MED ORDER — RANIBIZUMAB 0.5 MG/0.05ML IZ SOLN
0.5000 mg | INTRAVITREAL | Status: DC
Start: 2017-05-07 — End: 2017-05-07
  Filled 2017-05-07: qty 0.05

## 2017-05-07 MED ORDER — SODIUM CHLORIDE 0.9 % IJ SOLN
INTRAMUSCULAR | Status: AC
  Filled 2017-05-07: qty 10

## 2017-05-07 MED ORDER — NEOSTIGMINE METHYLSULFATE 5 MG/5ML IV SOSY
PREFILLED_SYRINGE | INTRAVENOUS | Status: AC
  Filled 2017-05-07: qty 5

## 2017-05-07 MED ORDER — STERILE WATER FOR INJECTION IJ SOLN
INTRAMUSCULAR | Status: DC | PRN
  Administered 2017-05-07: 200 mL

## 2017-05-07 MED ORDER — STERILE WATER FOR INJECTION IJ SOLN
INTRAMUSCULAR | Status: DC | PRN
  Administered 2017-05-07: 20 mL

## 2017-05-07 MED ORDER — CEFTAZIDIME 1 G IJ SOLR
INTRAMUSCULAR | Status: AC
  Filled 2017-05-07: qty 1

## 2017-05-07 MED ORDER — GATIFLOXACIN 0.5 % OP SOLN
OPHTHALMIC | Status: AC
  Filled 2017-05-07: qty 2.5

## 2017-05-07 MED ORDER — ATROPINE SULFATE 1 % OP SOLN
OPHTHALMIC | Status: AC
  Filled 2017-05-07: qty 5

## 2017-05-07 MED ORDER — PHENYLEPHRINE HCL 2.5 % OP SOLN
OPHTHALMIC | Status: AC
  Administered 2017-05-07: 1 [drp] via OPHTHALMIC
  Filled 2017-05-07: qty 2

## 2017-05-07 MED ORDER — LIDOCAINE HCL (PF) 2 % IJ SOLN
INTRAMUSCULAR | Status: AC
  Filled 2017-05-07: qty 10

## 2017-05-07 MED ORDER — ROCURONIUM BROMIDE 100 MG/10ML IV SOLN
INTRAVENOUS | Status: DC | PRN
  Administered 2017-05-07: 50 mg via INTRAVENOUS
  Administered 2017-05-07: 20 mg via INTRAVENOUS
  Administered 2017-05-07: 10 mg via INTRAVENOUS

## 2017-05-07 MED ORDER — SUGAMMADEX SODIUM 200 MG/2ML IV SOLN
INTRAVENOUS | Status: AC
  Filled 2017-05-07: qty 2

## 2017-05-07 MED ORDER — OXYCODONE HCL 5 MG PO TABS
5.0000 mg | ORAL_TABLET | Freq: Once | ORAL | Status: AC | PRN
  Administered 2017-05-07: 5 mg via ORAL

## 2017-05-07 MED ORDER — BRIMONIDINE TARTRATE 0.2 % OP SOLN
OPHTHALMIC | Status: AC
  Filled 2017-05-07: qty 5

## 2017-05-07 MED ORDER — GATIFLOXACIN 0.5 % OP SOLN OPTIME - NO CHARGE
OPHTHALMIC | Status: DC | PRN
  Administered 2017-05-07: 1 [drp] via OPHTHALMIC

## 2017-05-07 MED ORDER — DEXTROSE 50 % IV SOLN
INTRAVENOUS | Status: AC
  Filled 2017-05-07: qty 50

## 2017-05-07 MED ORDER — DEXAMETHASONE SODIUM PHOSPHATE 10 MG/ML IJ SOLN
INTRAMUSCULAR | Status: AC
  Filled 2017-05-07: qty 1

## 2017-05-07 MED ORDER — STERILE WATER FOR INJECTION IJ SOLN
INTRAMUSCULAR | Status: AC
  Filled 2017-05-07: qty 10

## 2017-05-07 MED ORDER — BSS PLUS IO SOLN
INTRAOCULAR | Status: AC
  Filled 2017-05-07: qty 500

## 2017-05-07 MED ORDER — FENTANYL CITRATE (PF) 100 MCG/2ML IJ SOLN: 25.0000 ug | INTRAMUSCULAR | Status: DC | PRN

## 2017-05-07 MED ORDER — ACETAMINOPHEN 160 MG/5ML PO SOLN: 325.0000 mg | ORAL | Status: DC | PRN

## 2017-05-07 MED ORDER — PREDNISOLONE ACETATE 1 % OP SUSP
OPHTHALMIC | Status: DC | PRN
  Administered 2017-05-07: 1 [drp] via OPHTHALMIC

## 2017-05-07 MED ORDER — NEOSTIGMINE METHYLSULFATE 10 MG/10ML IV SOLN
INTRAVENOUS | Status: DC | PRN
  Administered 2017-05-07: 4 mg via INTRAVENOUS

## 2017-05-07 MED ORDER — EPINEPHRINE PF 1 MG/ML IJ SOLN
INTRAOCULAR | Status: DC | PRN
  Administered 2017-05-07 (×2)

## 2017-05-07 MED ORDER — TROPICAMIDE 1 % OP SOLN
1.0000 [drp] | OPHTHALMIC | Status: AC | PRN
  Administered 2017-05-07 (×3): 1 [drp] via OPHTHALMIC

## 2017-05-07 MED ORDER — PROPARACAINE HCL 0.5 % OP SOLN
OPHTHALMIC | Status: AC
  Administered 2017-05-07: 1 [drp] via OPHTHALMIC
  Filled 2017-05-07: qty 15

## 2017-05-07 MED ORDER — ONDANSETRON HCL 4 MG/2ML IJ SOLN
INTRAMUSCULAR | Status: DC | PRN
  Administered 2017-05-07: 4 mg via INTRAVENOUS

## 2017-05-07 MED ORDER — NA CHONDROIT SULF-NA HYALURON 40-30 MG/ML IO SOLN
INTRAOCULAR | Status: DC | PRN
  Administered 2017-05-07: 0.5 mL via INTRAOCULAR

## 2017-05-07 MED ORDER — EPHEDRINE SULFATE 50 MG/ML IJ SOLN
INTRAMUSCULAR | Status: DC | PRN
  Administered 2017-05-07 (×3): 10 mg via INTRAVENOUS

## 2017-05-07 MED ORDER — BUPIVACAINE HCL (PF) 0.75 % IJ SOLN
INTRAMUSCULAR | Status: AC
  Filled 2017-05-07: qty 10

## 2017-05-07 MED ORDER — CARVEDILOL 12.5 MG PO TABS
12.5000 mg | ORAL_TABLET | Freq: Once | ORAL | Status: AC
  Administered 2017-05-07: 12.5 mg via ORAL
  Filled 2017-05-07: qty 1

## 2017-05-07 MED ORDER — RANIBIZUMAB 0.5 MG/0.05ML IZ SOLN
INTRAVITREAL | Status: DC | PRN
  Administered 2017-05-07: 0.5 mg via INTRAVITREAL

## 2017-05-07 MED ORDER — DORZOLAMIDE HCL-TIMOLOL MAL 2-0.5 % OP SOLN
OPHTHALMIC | Status: AC
  Filled 2017-05-07: qty 10

## 2017-05-07 MED ORDER — BACITRACIN-POLYMYXIN B 500-10000 UNIT/GM OP OINT
TOPICAL_OINTMENT | OPHTHALMIC | Status: AC
  Filled 2017-05-07: qty 3.5

## 2017-05-07 MED ORDER — PROPARACAINE HCL 0.5 % OP SOLN
1.0000 [drp] | OPHTHALMIC | Status: AC | PRN
  Administered 2017-05-07 (×3): 1 [drp] via OPHTHALMIC

## 2017-05-07 MED ORDER — BACITRACIN-POLYMYXIN B 500-10000 UNIT/GM OP OINT
TOPICAL_OINTMENT | OPHTHALMIC | Status: DC | PRN
  Administered 2017-05-07: 1 via OPHTHALMIC

## 2017-05-07 MED ORDER — PREDNISOLONE ACETATE 1 % OP SUSP
OPHTHALMIC | Status: AC
  Filled 2017-05-07: qty 5

## 2017-05-07 MED ORDER — TRIAMCINOLONE ACETONIDE 40 MG/ML IJ SUSP
INTRAMUSCULAR | Status: AC
  Filled 2017-05-07: qty 5

## 2017-05-07 MED ORDER — SODIUM CHLORIDE 0.9 % IV SOLN
INTRAVENOUS | Status: DC
  Administered 2017-05-07: 09:00:00 via INTRAVENOUS

## 2017-05-07 MED ORDER — DORZOLAMIDE HCL-TIMOLOL MAL 2-0.5 % OP SOLN
OPHTHALMIC | Status: DC | PRN
  Administered 2017-05-07: 1 [drp] via OPHTHALMIC

## 2017-05-07 MED ORDER — ACETAZOLAMIDE SODIUM 500 MG IJ SOLR
INTRAMUSCULAR | Status: AC
  Filled 2017-05-07: qty 500

## 2017-05-07 MED ORDER — INDOCYANINE GREEN 25 MG IV SOLR
INTRAVENOUS | Status: AC
  Filled 2017-05-07: qty 25

## 2017-05-07 MED ORDER — ACETAMINOPHEN 325 MG PO TABS: 325.0000 mg | ORAL_TABLET | ORAL | Status: DC | PRN

## 2017-05-07 MED ORDER — TRIAMCINOLONE ACETONIDE 40 MG/ML IJ SUSP
INTRAMUSCULAR | Status: DC | PRN
  Administered 2017-05-07: 40 mg

## 2017-05-07 MED ORDER — PHENYLEPHRINE HCL 10 MG/ML IJ SOLN
INTRAMUSCULAR | Status: DC | PRN
  Administered 2017-05-07: 40 ug via INTRAVENOUS
  Administered 2017-05-07 (×2): 80 ug via INTRAVENOUS

## 2017-05-07 MED ORDER — LIDOCAINE HCL 4 % EX SOLN
CUTANEOUS | Status: DC | PRN
  Administered 2017-05-07: 4 mL via TOPICAL

## 2017-05-07 MED ORDER — PHENYLEPHRINE HCL 2.5 % OP SOLN
1.0000 [drp] | OPHTHALMIC | Status: AC | PRN
  Administered 2017-05-07 (×3): 1 [drp] via OPHTHALMIC

## 2017-05-07 MED ORDER — EPINEPHRINE PF 1 MG/ML IJ SOLN
INTRAMUSCULAR | Status: AC
  Filled 2017-05-07: qty 1

## 2017-05-07 MED ORDER — LIDOCAINE HCL (PF) 4 % IJ SOLN
INTRAMUSCULAR | Status: AC
  Filled 2017-05-07: qty 5

## 2017-05-07 MED ORDER — LIDOCAINE HCL (CARDIAC) PF 100 MG/5ML IV SOSY
PREFILLED_SYRINGE | INTRAVENOUS | Status: DC | PRN
  Administered 2017-05-07: 100 mg via INTRAVENOUS

## 2017-05-07 MED ORDER — ONDANSETRON HCL 4 MG/2ML IJ SOLN
INTRAMUSCULAR | Status: AC
  Filled 2017-05-07: qty 2

## 2017-05-07 MED ORDER — NA CHONDROIT SULF-NA HYALURON 40-30 MG/ML IO SOLN
INTRAOCULAR | Status: AC
  Filled 2017-05-07: qty 1

## 2017-05-07 MED ORDER — ATROPINE SULFATE 1 % OP SOLN
OPHTHALMIC | Status: DC | PRN
  Administered 2017-05-07: 1 [drp] via OPHTHALMIC

## 2017-05-07 MED ORDER — CARVEDILOL 12.5 MG PO TABS
ORAL_TABLET | ORAL | Status: AC
  Administered 2017-05-07: 12.5 mg via ORAL
  Filled 2017-05-07: qty 1

## 2017-05-07 MED ORDER — OXYCODONE HCL 5 MG PO TABS
ORAL_TABLET | ORAL | Status: AC
  Filled 2017-05-07: qty 1

## 2017-05-07 MED ORDER — BRIMONIDINE TARTRATE 0.2 % OP SOLN
OPHTHALMIC | Status: DC | PRN
  Administered 2017-05-07: 1 [drp] via OPHTHALMIC

## 2017-05-07 MED ORDER — GLYCOPYRROLATE 0.2 MG/ML IJ SOLN
INTRAMUSCULAR | Status: DC | PRN
  Administered 2017-05-07: 0.6 mg via INTRAVENOUS

## 2017-05-07 MED ORDER — CYCLOPENTOLATE HCL 1 % OP SOLN
1.0000 [drp] | OPHTHALMIC | Status: AC | PRN
  Administered 2017-05-07 (×3): 1 [drp] via OPHTHALMIC

## 2017-05-07 MED ORDER — FENTANYL CITRATE (PF) 250 MCG/5ML IJ SOLN
INTRAMUSCULAR | Status: AC
  Filled 2017-05-07: qty 5

## 2017-05-07 MED ORDER — OXYCODONE HCL 5 MG/5ML PO SOLN: 5.0000 mg | Freq: Once | ORAL | Status: AC | PRN

## 2017-05-07 MED ORDER — FENTANYL CITRATE (PF) 100 MCG/2ML IJ SOLN
INTRAMUSCULAR | Status: DC | PRN
  Administered 2017-05-07 (×3): 50 ug via INTRAVENOUS

## 2017-05-07 MED ORDER — PHENYLEPHRINE HCL 10 MG/ML IJ SOLN
INTRAVENOUS | Status: DC | PRN
  Administered 2017-05-07: 20 ug/min via INTRAVENOUS

## 2017-05-07 MED ORDER — BSS IO SOLN
INTRAOCULAR | Status: AC
  Filled 2017-05-07: qty 15

## 2017-05-07 MED ORDER — 0.9 % SODIUM CHLORIDE (POUR BTL) OPTIME
TOPICAL | Status: DC | PRN
  Administered 2017-05-07: 200 mL

## 2017-05-07 MED ORDER — POLYMYXIN B SULFATE 500000 UNITS IJ SOLR
INTRAMUSCULAR | Status: AC
  Filled 2017-05-07: qty 500000

## 2017-05-07 MED ORDER — CARBACHOL 0.01 % IO SOLN
INTRAOCULAR | Status: AC
  Filled 2017-05-07: qty 1.5

## 2017-05-07 MED ORDER — TROPICAMIDE 1 % OP SOLN
OPHTHALMIC | Status: AC
  Administered 2017-05-07: 1 [drp] via OPHTHALMIC
  Filled 2017-05-07: qty 15

## 2017-05-07 MED ORDER — PROPOFOL 10 MG/ML IV BOLUS
INTRAVENOUS | Status: DC | PRN
  Administered 2017-05-07: 150 mg via INTRAVENOUS

## 2017-05-07 MED ORDER — TOBRAMYCIN-DEXAMETHASONE 0.3-0.1 % OP OINT
TOPICAL_OINTMENT | OPHTHALMIC | Status: AC
  Filled 2017-05-07: qty 3.5

## 2017-05-07 SURGICAL SUPPLY — 72 items
APPLICATOR COTTON TIP 6IN STRL (MISCELLANEOUS) ×10 IMPLANT
APPLICATOR DR MATTHEWS STRL (MISCELLANEOUS) ×4 IMPLANT
BANDAGE EYE OVAL (MISCELLANEOUS) IMPLANT
BLADE EYE CATARACT 19 1.4 BEAV (BLADE) IMPLANT
BLADE MVR KNIFE 20G (BLADE) ×2 IMPLANT
CABLE BIPOLOR RESECTION CORD (MISCELLANEOUS) ×2 IMPLANT
CANNULA ANT CHAM MAIN (OPHTHALMIC RELATED) ×2 IMPLANT
CANNULA FLEX TIP 25G (CANNULA) ×2 IMPLANT
CANNULA TROCAR 23 GA VLV (OPHTHALMIC) IMPLANT
CANNULA VLV SOFT TIP 25GA (OPHTHALMIC) ×2 IMPLANT
CLSR STERI-STRIP ANTIMIC 1/2X4 (GAUZE/BANDAGES/DRESSINGS) ×2 IMPLANT
DRAPE MICROSCOPE LEICA 46X105 (MISCELLANEOUS) ×2 IMPLANT
DRAPE OPHTHALMIC 77X100 STRL (CUSTOM PROCEDURE TRAY) ×2 IMPLANT
ERASER HMR WETFIELD 23G BP (MISCELLANEOUS) IMPLANT
FILTER BLUE MILLIPORE (MISCELLANEOUS) IMPLANT
FILTER STRAW FLUID ASPIR (MISCELLANEOUS) IMPLANT
FORCEPS GRIESHABER ILM 25G A (INSTRUMENTS) IMPLANT
FORCEPS GRIESHABER MAX 25G (MISCELLANEOUS) IMPLANT
GAS AUTO FILL CONSTEL (OPHTHALMIC)
GAS AUTO FILL CONSTELLATION (OPHTHALMIC) IMPLANT
GLOVE BIO SURGEON STRL SZ7.5 (GLOVE) ×2 IMPLANT
GLOVE BIOGEL M 7.0 STRL (GLOVE) ×2 IMPLANT
GLOVE SKINSENSE NS SZ7.0 (GLOVE) ×1
GLOVE SKINSENSE STRL SZ7.0 (GLOVE) ×1 IMPLANT
GLOVE SS BIOGEL STRL SZ 7 (GLOVE) ×1 IMPLANT
GLOVE SUPERSENSE BIOGEL SZ 7 (GLOVE) ×1
GOWN STRL REUS W/ TWL LRG LVL3 (GOWN DISPOSABLE) ×3 IMPLANT
GOWN STRL REUS W/ TWL XL LVL3 (GOWN DISPOSABLE) ×1 IMPLANT
GOWN STRL REUS W/TWL LRG LVL3 (GOWN DISPOSABLE) ×3
GOWN STRL REUS W/TWL XL LVL3 (GOWN DISPOSABLE) ×1
KIT BASIN OR (CUSTOM PROCEDURE TRAY) ×2 IMPLANT
KIT PERFLUORON PROCEDURE 5ML (MISCELLANEOUS) IMPLANT
LENS BIOM SUPER VIEW SET DISP (OPHTHALMIC RELATED) IMPLANT
LENS VITRECTOMY FLAT OCLR DISP (MISCELLANEOUS) ×2 IMPLANT
MICROPICK 25G (MISCELLANEOUS)
NEEDLE 18GX1X1/2 (RX/OR ONLY) (NEEDLE) ×4 IMPLANT
NEEDLE 25GX 5/8IN NON SAFETY (NEEDLE) ×2 IMPLANT
NEEDLE HYPO 30X.5 LL (NEEDLE) IMPLANT
NEEDLE PRECISIONGLIDE 27X1.5 (NEEDLE) IMPLANT
NS IRRIG 1000ML POUR BTL (IV SOLUTION) ×2 IMPLANT
PACK VITRECTOMY CUSTOM (CUSTOM PROCEDURE TRAY) ×2 IMPLANT
PAD ARMBOARD 7.5X6 YLW CONV (MISCELLANEOUS) ×4 IMPLANT
PAK PIK VITRECTOMY CVS 25GA (OPHTHALMIC) ×2 IMPLANT
PENCIL BIPOLAR 25GA STR DISP (OPHTHALMIC RELATED) ×2 IMPLANT
PICK MICROPICK 25G (MISCELLANEOUS) IMPLANT
PROBE DIATHERMY DSP 27GA (MISCELLANEOUS) IMPLANT
PROBE ENDO DIATHERMY 25G (MISCELLANEOUS) ×2 IMPLANT
PROBE LASER ILLUM FLEX CVD 25G (OPHTHALMIC) ×2 IMPLANT
REPL STRA BRUSH NEEDLE (NEEDLE) ×2 IMPLANT
RESERVOIR BACK FLUSH (MISCELLANEOUS) ×2 IMPLANT
RETRACTOR IRIS FLEX 25G GRIESH (INSTRUMENTS) IMPLANT
ROLLS DENTAL (MISCELLANEOUS) IMPLANT
SPONGE SURGIFOAM ABS GEL 12-7 (HEMOSTASIS) IMPLANT
STOPCOCK 4 WAY LG BORE MALE ST (IV SETS) IMPLANT
SUT CHROMIC 7 0 TG140 8 (SUTURE) ×2 IMPLANT
SUT ETHILON 5.0 S-24 (SUTURE) IMPLANT
SUT ETHILON 9 0 TG140 8 (SUTURE) IMPLANT
SUT MERSILENE 4 0 RV 2 (SUTURE) IMPLANT
SUT SILK 2 0 (SUTURE) ×1
SUT SILK 2 0 TIES 17X18 (SUTURE) ×1
SUT SILK 2-0 18XBRD TIE 12 (SUTURE) ×1 IMPLANT
SUT SILK 2-0 18XBRD TIE BLK (SUTURE) ×1 IMPLANT
SUT SILK 4 0 RB 1 (SUTURE) IMPLANT
SUT VICRYL 7 0 TG140 8 (SUTURE) ×2 IMPLANT
SYR 10ML LL (SYRINGE) ×2 IMPLANT
SYR 20CC LL (SYRINGE) ×2 IMPLANT
SYR 5ML LL (SYRINGE) ×2 IMPLANT
SYR BULB 3OZ (MISCELLANEOUS) ×2 IMPLANT
SYR TB 1ML LUER SLIP (SYRINGE) ×4 IMPLANT
TOWEL NATURAL 6PK STERILE (DISPOSABLE) ×2 IMPLANT
TUBING HIGH PRESS EXTEN 6IN (TUBING) ×2 IMPLANT
WATER STERILE IRR 1000ML POUR (IV SOLUTION) ×2 IMPLANT

## 2017-05-07 NOTE — Interval H&P Note (Signed)
History and Physical Interval Note:  05/07/2017 12:22 PM  [name]  has presented today for surgery, with the diagnosis of Hemorrhagic posterior viterous detachment  The various methods of treatment have been discussed with the patient and family. After consideration of risks, benefits and other options for treatment, the patient has consented to  Procedure(s): 25 GAUGE PARS PLANA VITRECTOMY WITH 20 GAUGE MVR PORT (Left) as a surgical intervention .  The patient's history has been reviewed, patient examined, no change in status, stable for surgery.  I have reviewed the patient's chart and labs.  Questions were answered to the patient's satisfaction.     Bernarda Caffey

## 2017-05-07 NOTE — Brief Op Note (Signed)
05/07/2017  4:09 PM  PATIENT:  [name]  72 y.o. female  PRE-OPERATIVE DIAGNOSIS:  Vitreous hemorrhage, LEFT EYE  POST-OPERATIVE DIAGNOSIS:  Vitreous hemorrhage, LEFT EYE  PROCEDURE:  Procedure(s): 25 GAUGE PARS PLANA VITRECTOMY WITH 20 GAUGE MVR PORT LEFT EYE (Left) LASER PHOTO ABLATION LEFT EYE (Left)  SURGEON:  Surgeon(s) and Role:    Bernarda Caffey, MD - Primary  ASSISTANTS: Catha Brow, COA   ANESTHESIA:   general  EBL:  minimal  BLOOD ADMINISTERED:none  DRAINS: none   LOCAL MEDICATIONS USED:  NONE  SPECIMEN:  No Specimen  DISPOSITION OF SPECIMEN:  N/A  COUNTS:  YES  TOURNIQUET:  * No tourniquets in log *  DICTATION: .Note written in EPIC  PLAN OF CARE: Discharge to home after PACU  PATIENT DISPOSITION:  PACU - hemodynamically stable.   Delay start of Pharmacological VTE agent (>24hrs) due to surgical blood loss or risk of bleeding: not applicable

## 2017-05-07 NOTE — Anesthesia Procedure Notes (Addendum)
Procedure Name: Intubation Date/Time: 05/07/2017 1:58 PM Performed by: Scheryl Darter, CRNA Pre-anesthesia Checklist: Patient identified, Emergency Drugs available, Suction available and Patient being monitored Patient Re-evaluated:Patient Re-evaluated prior to induction Oxygen Delivery Method: Circle System Utilized Preoxygenation: Pre-oxygenation with 100% oxygen Induction Type: IV induction Ventilation: Mask ventilation without difficulty Laryngoscope Size: Glidescope Grade View: Grade II Tube type: Oral Tube size: 7.0 mm Number of attempts: 3 Airway Equipment and Method: Stylet and Oral airway Placement Confirmation: ETT inserted through vocal cords under direct vision,  positive ETCO2 and breath sounds checked- equal and bilateral Secured at: 23 cm Tube secured with: Tape Dental Injury: Teeth and Oropharynx as per pre-operative assessment  Difficulty Due To: Difficult Airway- due to large tongue Comments: Sabra Heck 2/Mac 3 could not hold tongue out of the way/copious secretions and redundant pharyngeal tissue/glidescope used/ Dr Seward Speck

## 2017-05-07 NOTE — Transfer of Care (Signed)
Immediate Anesthesia Transfer of Care Note  Patient: [name]  Procedure(s) Performed: 25 GAUGE PARS PLANA VITRECTOMY WITH ENDOLASER LEFT EYE (Left Eye) LASER PHOTO ABLATION LEFT EYE (Left Eye)  Patient Location: PACU  Anesthesia Type:General  Level of Consciousness: awake, alert , oriented and sedated  Airway & Oxygen Therapy: Patient Spontanous Breathing and Patient connected to nasal cannula oxygen  Post-op Assessment: Report given to RN, Post -op Vital signs reviewed and stable and Patient moving all extremities  Post vital signs: Reviewed and stable  Last Vitals:  Vitals Value Taken Time  BP 119/61 05/07/2017  4:16 PM  Temp 36.7 C 05/07/2017  4:16 PM  Pulse 81 05/07/2017  4:19 PM  Resp 19 05/07/2017  4:19 PM  SpO2 97 % 05/07/2017  4:19 PM  Vitals shown include unvalidated device data.  Last Pain:  Vitals:   05/07/17 1616  TempSrc:   PainSc: Asleep      Patients Stated Pain Goal: 0 (19/41/74 0814)  Complications: No apparent anesthesia complications

## 2017-05-07 NOTE — Op Note (Signed)
Date of procedure: 04.18.19  Surgeon: Bernarda Caffey, M.D., Ph.D  Assistant: Alyse Low, COA  Pre-operative Diagnosis: 1. Vitreous hemorrhage, Left eye 2. Diabetic retinopathy, Left eye 3. Hypertensive retinopathy, Left eye  Post-operative diagnosis: Same  Anesthesia: General  Procedure: 1)   25 gauge pars plana vitrectomy w/ endolaser, Left Eye CPT [phone])     Intravitreal injection of ranibizumab (Lucentis), Left Eye  Complications: none Estimated blood loss: minimal Specimens: none  Brief history:  The patient has a history of decreased vision in the affected eye, and on examination, was noted to have vitreous hemorrhage affecting activities of daily living. The hemorrhage was monitored closely and failed to clear.The risks, benefits, and alternatives were explained to the patient, including pain, bleeding, infection, loss of vision, double vision, droopy eyelids, and need for more surgeries. Informed consent was obtained from the patient and placed in the chart.   Procedure: The patient was brought to the preoperative holding area where the correct eye was confirmed and marked. The patient was then brought to the operating room where general endotracheal anesthesia was induced by the Anesthesia team without complication. A secondary time-out was performed to identify the correct patient, eye, procedure, and any allergies. The eye was prepped and draped in the usual sterile ophthalmic fashion followed by placement of a lid speculum.  A 25 gauge trocar was placed in the inferotemporal quadrant 3.5 mm posterior to the limbus in a beveled fashion. A 4 mm infusion cannula was placed through this trocar, and the infusion cannula was confirmed in the vitreous cavity with no incarceration of retina or choroid prior to turning it on. Two additional 25 gauge trocars were placed in the superonasal and superotemporal quadrants in a similar beveled fashion. The light  pipe and cutter were introduced into the eye. Dense vitreous hemorrhage obscured the view of the retina. Direct vitrectomy was was performed to clear the anterior vitreous hemorrhage.  At this time, a standard three-port pars plana vitrectomy was performed using the light pipe, the cutter, and the BIOM viewing system. A thorough core and peripheral vitreous dissection was performed. Once the retina was visualized. There was significant peripheral sclerosis of blood vessels consistent with hypertensive retinopathy. There were scattered microaneurysms consistent with diabetic retinopathy. The remainder of the retina was attached without any other breaks or defects.  Kenalog was used to highlight the vitreous. Care was taken to insure the posterior vitreous was detached. Peripheral vitrectomy was completed with care. Next, endolaser was used to treat peripheral areas of capillary nonperfusion 360. There were no other peripheral breaks.  The trocars were then removed and sutured with 7-0 vicryl in an interrupted fashion. An intravitreal injection of ranibizumab (0.5 mg) was delivered via pre-filled syringe, 3.5 mm posterior to the limbus in the superonasal quadrant. Subconjunctival injections of antibiotic and Kenalog were administered. The lid speculum and drapes were removed. Drops of an antibiotic and steroid were given. The eye was patched and shielded. The patient tolerated the procedure well without any intraoperative or immediate postoperative complications. The patient was taken to the recovery room in good condition. The patient was instructed to follow-up with Dr. Coralyn Pear in clinic on the following morning.

## 2017-05-07 NOTE — Anesthesia Postprocedure Evaluation (Signed)
Anesthesia Post Note  Patient: [name]  Procedure(s) Performed: 25 GAUGE PARS PLANA VITRECTOMY WITH ENDOLASER LEFT EYE (Left Eye)     Patient location during evaluation: PACU Anesthesia Type: General Level of consciousness: awake and alert Pain management: pain level controlled Vital Signs Assessment: post-procedure vital signs reviewed and stable Respiratory status: spontaneous breathing, nonlabored ventilation, respiratory function stable and patient connected to nasal cannula oxygen Cardiovascular status: blood pressure returned to baseline and stable Postop Assessment: no apparent nausea or vomiting Anesthetic complications: no    Last Vitals:  Vitals:   05/07/17 1644 05/07/17 1706  BP: 133/69 (!) 136/59  Pulse: 76 74  Resp: 19 18  Temp: 36.7 C 36.7 C  SpO2: 94% 94%    Last Pain:  Vitals:   05/07/17 1650  TempSrc:   PainSc: [place]

## 2017-05-07 NOTE — Anesthesia Preprocedure Evaluation (Signed)
Anesthesia Evaluation  Patient identified by MRN, date of birth, ID band Patient awake    Reviewed: Allergy & Precautions, NPO status , Patient's Chart, lab work & pertinent test results, reviewed documented beta blocker date and time   History of Anesthesia Complications Negative for: history of anesthetic complications  Airway Mallampati: I  TM Distance: >3 FB Neck ROM: Full    Dental  (+) Upper Dentures   Pulmonary shortness of breath, sleep apnea , former smoker,    breath sounds clear to auscultation       Cardiovascular hypertension, Pt. on medications and Pt. on home beta blockers + angina + CAD, + Past MI and +CHF   Rhythm:Regular     Neuro/Psych  Headaches,  Neuromuscular disease    GI/Hepatic Neg liver ROS, hiatal hernia,   Endo/Other  diabetes, Type 2, Insulin DependentMorbid obesity  Renal/GU ESRF and DialysisRenal disease     Musculoskeletal  (+) Arthritis ,   Abdominal   Peds  Hematology  (+) anemia ,   Anesthesia Other Findings   Reproductive/Obstetrics                             Anesthesia Physical Anesthesia Plan  ASA: III  Anesthesia Plan: General   Post-op Pain Management:    Induction: Intravenous  PONV Risk Score and Plan: 3 and Ondansetron and Dexamethasone  Airway Management Planned: Oral ETT  Additional Equipment: None  Intra-op Plan:   Post-operative Plan: Extubation in OR  Informed Consent: I have reviewed the patients History and Physical, chart, labs and discussed the procedure including the risks, benefits and alternatives for the proposed anesthesia with the patient or authorized representative who has indicated his/her understanding and acceptance.   Dental advisory given  Plan Discussed with: CRNA and Surgeon  Anesthesia Plan Comments:         Anesthesia Quick Evaluation

## 2017-05-07 NOTE — Discharge Instructions (Addendum)
POSTOPERATIVE INSTRUCTIONS  Your doctor has performed vitreoretinal surgery on you at [facility]. [place] eye patched and shielded until seen by Dr. Coralyn Pear 8 AM tomorrow in clinic - Do not use drops until return - Portal - Sleep with head elevated, avoid laying flat on back.    - No strenuous bending, stooping or lifting.  - You may not drive until further notice.  - Tylenol or any other over-the-counter pain reliever can be used according to your doctor. If more pain medicine is required, your doctor will have a prescription for you.  - You may read, go up and down stairs, and watch television.     Bernarda Caffey, M.D., Ph.D.

## 2017-05-08 ENCOUNTER — Ambulatory Visit (INDEPENDENT_AMBULATORY_CARE_PROVIDER_SITE_OTHER): Payer: Medicare HMO | Admitting: Ophthalmology

## 2017-05-08 ENCOUNTER — Encounter (HOSPITAL_COMMUNITY): Payer: Self-pay | Admitting: Ophthalmology

## 2017-05-08 DIAGNOSIS — Z961 Presence of intraocular lens: Secondary | ICD-10-CM

## 2017-05-08 DIAGNOSIS — Z970 Presence of artificial eye: Secondary | ICD-10-CM

## 2017-05-08 DIAGNOSIS — H43812 Vitreous degeneration, left eye: Secondary | ICD-10-CM

## 2017-05-08 DIAGNOSIS — H4312 Vitreous hemorrhage, left eye: Secondary | ICD-10-CM

## 2017-05-08 NOTE — Progress Notes (Signed)
[facility] Note  05/08/2017     CHIEF COMPLAINT Patient presents for Post-op Follow-up   HISTORY OF PRESENT ILLNESS: [name] is a 72 y.o. female who presents to the clinic today for:   HPI    Post-op Follow-up    In left eye.  Discomfort includes pain.  Negative for foreign body sensation, discharge, none, floaters, tearing and itching.  Vision is stable.  I, the attending physician,  performed the HPI with the patient and updated documentation appropriately.          Comments    POD 1; S/P 25g PPV/EL/Lucentis injection OS (04.18.19); Pt states OS is stable; Pt reports OS was painful last night but states "its okay" today; Pt has not removed patch; Pt states she was nauseous after waking yesterday;         Last edited by Cherrie Gauze, COA on 05/08/2017  8:16 AM. (History)       Referring physician: [name], MD [place], [place] [address]  HISTORICAL INFORMATION:   Selected notes from the MEDICAL RECORD NUMBER Referred by Dr. Delane Ginger. Hensel for possible RT OS;  LEE-  Ocular Hx-  PMH-     CURRENT MEDICATIONS: No current outpatient medications on file. (Ophthalmic Drugs)   No current facility-administered medications for this visit.  (Ophthalmic Drugs)   Current Outpatient Medications (Other)  Medication Sig  . albuterol (PROVENTIL) (2.5 MG/3ML) 0.083% nebulizer solution Take 3 mLs (2.5 mg total) by nebulization every 2 (two) hours as needed for shortness of breath.  Marland Kitchen aspirin EC 81 MG tablet Take 81 mg by mouth daily.    Marland Kitchen atorvastatin (LIPITOR) 40 MG tablet Take 40 mg by mouth daily.   . carvedilol (COREG) 25 MG tablet Take 0.5 tablets (12.5 mg total) by mouth 2 (two) times daily. (Patient taking differently: Take 25 mg by mouth 2 (two) times daily. )  . cinacalcet (SENSIPAR) 90 MG tablet Take 1 tablet (90 mg) by mouth daily. On dialysis days, take the dose after dialysis  . diphenhydrAMINE (BENADRYL) 25 mg  capsule Take 50 mg by mouth See admin instructions. Take 2 tablets (50 mg) by mouth prior to dialysis on Tuesday, Thursday, Saturday - for itching  . ethyl chloride spray Apply 1 application topically See admin instructions. Apply topically before dialysis on Tuesday, Thursday, Saturday  . guaiFENesin-dextromethorphan (ROBITUSSIN DM) 100-10 MG/5ML syrup Take 5 mLs by mouth every 4 (four) hours while awake. (Patient not taking: Reported on 04/20/2017)  . HYDROcodone-acetaminophen (NORCO) 5-325 MG tablet Take 1 tablet by mouth every 6 (six) hours as needed for moderate pain. (Patient not taking: Reported on 05/07/2017)  . insulin glargine (LANTUS) 100 UNIT/ML injection Inject 0.05 mLs (5 Units total) into the skin at bedtime. (Patient taking differently: Inject 6 Units into the skin at bedtime. )  . midodrine (PROAMATINE) 10 MG tablet Take 10 mg by mouth See admin instructions. Take 10mg  on Tues, Thurs, and Saturday before dialysis  . sevelamer carbonate (RENVELA) 800 MG tablet Take 2-3 tablets (1,600-2,400 mg total) by mouth See admin instructions. Take 3 tablets (2,400 mg) by mouth 3 times daily with meals. Take 2 tablets (1,600 mg) by mouth with snacks. Receives supply from dialysis center.  . tiotropium (SPIRIVA) 18 MCG inhalation capsule Place 1 capsule (18 mcg total) into inhaler and inhale daily. (Patient taking differently: Place 18 mcg into inhaler and inhale daily as needed (shortness of breath). )   No  current facility-administered medications for this visit.  (Other)      REVIEW OF SYSTEMS: ROS    Positive for: Genitourinary, Endocrine, Eyes   Negative for: Constitutional, Gastrointestinal, Neurological, Skin, Musculoskeletal, HENT, Cardiovascular, Respiratory, Psychiatric, Allergic/Imm, Heme/Lymph   Last edited by Cherrie Gauze, COA on 05/08/2017  8:11 AM. (History)       ALLERGIES Allergies  Allergen Reactions  . Lyrica [Pregabalin] Other (See Comments)    Hallucinations     PAST MEDICAL HISTORY Past Medical History:  Diagnosis Date  . Abdominal abscess 12-17-10   abdominal abscesses x2 ? one at this time  . Anemia   . Arthritis    "all over" (08/23/2012)  . Blind right eye    fell and crushed socket and eye fell out, was replaced at [facility]  . CHF (congestive heart failure) ([place])   . Chronic lower back pain   . Coronary artery disease    normal coronaries by 08/25/12 cath  . Dyspnea   . Dysrhythmia    irregular, "skips beats"  . ESRD (end stage renal disease) on dialysis ([place])    "started 12/2011; [place]; TTS" (08/23/2012  . Exertional shortness of breath   . Eye drainage    "lots; since fall 07/2011" (08/23/2012)  . Gout   . H/O hiatal hernia   . Headache(784.0)    "weekly sometimes; since I fell and hit my head in 07/2011 08/23/2012)  . Heart murmur   . History of blood transfusion    "lots before starting dialysis" (08/23/2012)  . Hypertension    sees Dr. Willey Blade  . Inhalation injury    "worked at [employer]; can't inhale polyurethane or paint, etc" (08/23/2012)  . Myocardial infarction ([place]) 1970's or 1980's  . Neuromuscular disorder ([place])    diabetic neuropathy  . OSA on CPAP   . Staph aureus infection November 2012   . Swelling of both ankles 12-17-10  . Type II diabetes mellitus ([place])    "over 10 years now" (08/23/2012)   Past Surgical History:  Procedure Laterality Date  . [place] VITRECTOMY WITH 20 GAUGE MVR PORT Left 05/07/2017   Procedure: 25 GAUGE PARS PLANA VITRECTOMY WITH ENDOLASER LEFT EYE;  Surgeon: Bernarda Caffey, MD;  Location: [facility];  Service: Ophthalmology;  Laterality: Left;  . APPENDECTOMY    . AV FISTULA PLACEMENT  09/30/2011   Procedure: ARTERIOVENOUS (AV) FISTULA CREATION;  Surgeon: Conrad Point Pleasant, MD;  Location: [facility];  Service: Vascular;  Laterality: Left;  BRACHIAL-CEPHALIC  . BACK SURGERY    . BASCILIC VEIN TRANSPOSITION Left 11/10/2011   Left arm   . [place] TRANSPOSITION  01/28/2012    Procedure: BASCILIC VEIN TRANSPOSITION;  Surgeon: Conrad Seneca, MD;  Location: [facility];  Service: Vascular;  Laterality: Left;  Second Stage   . CARDIAC CATHETERIZATION  1980's  . CATARACT EXTRACTION W/ INTRAOCULAR LENS IMPLANT Bilateral 1990's  . CORONARY ANGIOGRAPHY N/A 04/07/2017   Procedure: CORONARY ANGIOGRAPHY (CATH LAB);  Surgeon: Dixie Dials, MD;  Location: [facility] CV LAB;  Service: Cardiovascular;  Laterality: N/A;  . DILATION AND CURETTAGE OF UTERUS  1960's  . EYE SURGERY    . FISTULA SUPERFICIALIZATION Left 02/18/2017   Procedure: SUPERFICIALIZATION LEFT ARM ARTERIOVENOUS FISTULA;  Surgeon: Serafina Mitchell, MD;  Location: [facility];  Service: Vascular;  Laterality: Left;  . HERNIA REPAIR    . INSERTION OF DIALYSIS CATHETER  01/05/2012   Procedure: INSERTION OF DIALYSIS CATHETER;  Surgeon: Aaron Edelman  Starlyn Skeans, MD;  Location: [facility] OR;  Service: Vascular;  Laterality: N/A;  Right Internal Jugular Placement  . INTRAOCULAR PROSTHESES INSERTION Right 2013   "fell; knocked my eye outl" (08/23/2012)  . JOINT REPLACEMENT    . LAPAROTOMY  12/18/2010   Procedure: EXPLORATORY LAPAROTOMY;  Surgeon: Rolm Bookbinder, MD;  Location: [facility];  Service: General;  Laterality: N/A;  Abdominal Seroma Evacuation  . LEFT HEART CATHETERIZATION WITH CORONARY ANGIOGRAM N/A 08/25/2012   Procedure: LEFT HEART CATHETERIZATION WITH CORONARY ANGIOGRAM;  Surgeon: Birdie Riddle, MD;  Location: [facility] CATH LAB;  Service: Cardiovascular;  Laterality: N/A;  . LUMBAR DISC SURGERY  1970'-80's   X 3  . TOTAL KNEE ARTHROPLASTY Left 1980's  . VAGINAL HYSTERECTOMY  1970's  . VENTRAL HERNIA REPAIR  2012-2013   component separation, repair with biologic; "had 3 surgeries to fix it" (08/23/2012)  . WOUND DEBRIDEMENT  03/20/2011   Procedure: DEBRIDEMENT ABDOMINAL WOUND;  Surgeon: Rolm Bookbinder, MD;  Location[facility] OR;  Service: General;  Laterality: N/A;  debridement abdominal wall, placement of wound vac    FAMILY HISTORY Family History   Problem Relation Age of Onset  . Hypertension Mother   . Cancer Brother        spine  . Hypertension Father   . Cancer Sister        brain  . Hypertension Maternal Grandmother   . Anesthesia problems Neg Hx     SOCIAL HISTORY Social History   Tobacco Use  . Smoking status: Former Smoker    Packs/day: 0.25    Years: 16.00    Pack years: 4.00    Types: Cigarettes    Last attempt to quit: 12/16/1980    Years since quitting: 36.4  . Smokeless tobacco: Never Used  Substance Use Topics  . Alcohol use: No  . Drug use: No         OPHTHALMIC EXAM:  Base Eye Exam    Visual Acuity (Snellen - Linear)      Right Left   Dist [place] prosthetic 20/350   Dist ph [place]  20/300       Tonometry (Tonopen, 8:23 AM)      Right Left   Pressure pros 08       Pupils      Dark Light Shape React APD   Right prosthetic       Left 6 6 Round NR None       Neuro/Psych    Oriented x3:  Yes   Mood/Affect:  Normal        Slit Lamp and Fundus Exam    Slit Lamp Exam      Right Left   Lids/Lashes  Lid retraction   Conjunctiva/Sclera Prosthetic in good postion Melanosis, sutures intact, 360 sub-coj hemorrhage   Cornea  1+ Punctate epithelial erosions, irregular epithelium, 1+ Descemet's folds   Anterior Chamber  Deep and quiet   Iris  Round and dilated, No NVI   Lens  Three piece PC IOL in good position, open PC   Vitreous  Post vitrectomy; Vitreous hemorrhage cleared       Fundus Exam      Right Left   Disc  Normal   C/D Ratio  0.5   Macula  Flat   Vessels  attenuation; peripheral sclerosis   Periphery  Attached; good 360 peripheral laser          IMAGING AND PROCEDURES  Imaging and Procedures for 05/08/17  ASSESSMENT/PLAN:    ICD-10-CM   1. Vitreous hemorrhage of left eye (HCC) H43.12   2. Posterior vitreous detachment of left eye H43.812   3. Pseudophakia of left eye Z96.1   4. 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 Vitreous hemorrhage, suspect  Hemorrhagic PVD OS-  - POD 1; S/P 25g PPV/EL/Lucentis injection OS (04.18.19)             - doing well this morning -- VH cleared             - retina attached and in good position -- good buckle height and laser around breaks             - IOP good             - start   PF 6x/day OD                         zymaxid QID OD                         Atropine BID OD                         PSO ung QID OD             - keep head elevated             - eye shield when sleeping             - post op drop and positioning instructions reviewed             - tylenol/ibuprofen for pain  - F/U 1 wk   3. Pseudophakia OS-  - s/p CE/IOL  - beautiful surgery, doing well  - monitor  4. Prosthetic OD-  - prosthetic in good position - traumatic fall w/ open globe repair 2013 (Dr. Lennon Alstrom) --> enucleated by Dr. Toy Cookey - had prosthetic cleaned recently in [facility] - stable  Ophthalmic Meds Ordered this visit:  No orders of the defined types were placed in this encounter.      Return in about 1 week (around 05/15/2017) for POV as scheduled.  There are no Patient Instructions on file for this visit.   Explained the diagnoses, plan, and follow up with the patient and they expressed understanding.  Patient expressed understanding of the importance of proper follow up care.   This document serves as a record of services personally performed by Gardiner Sleeper, MD, PhD. It was created on their behalf by Catha Brow, [place], a certified ophthalmic assistant. The creation of this record is the provider's dictation and/or activities during the visit.  Electronically signed by: Catha Brow, [place]  05/08/17 8:51 AM    Gardiner Sleeper, M.D., Ph.D. Diseases & Surgery of the Retina and [place] 05/08/17  I have reviewed the above documentation for accuracy and completeness, and I agree with the above. Gardiner Sleeper, M.D., Ph.D. 05/08/17 8:51  AM    Abbreviations: M myopia (nearsighted); A astigmatism; H hyperopia (farsighted); P presbyopia; Mrx spectacle prescription;  CTL contact lenses; OD right eye; OS left eye; OU both eyes  XT exotropia; ET esotropia; PEK punctate epithelial keratitis; PEE punctate epithelial erosions; DES dry eye syndrome; MGD meibomian gland dysfunction; ATs artificial tears; PFAT's preservative free artificial tears; [place] nuclear sclerotic cataract; PSC posterior subcapsular cataract; ERM epi-retinal  membrane; PVD posterior vitreous detachment; RD retinal detachment; DM diabetes mellitus; DR diabetic retinopathy; NPDR non-proliferative diabetic retinopathy; PDR proliferative diabetic retinopathy; CSME clinically significant macular edema; DME diabetic macular edema; dbh dot blot hemorrhages; CWS cotton wool spot; POAG primary open angle glaucoma; C/D cup-to-disc ratio; HVF humphrey visual field; GVF goldmann visual field; OCT optical coherence tomography; IOP intraocular pressure; BRVO Branch retinal vein occlusion; CRVO central retinal vein occlusion; CRAO central retinal artery occlusion; BRAO branch retinal artery occlusion; RT retinal tear; SB scleral buckle; PPV pars plana vitrectomy; VH Vitreous hemorrhage; PRP panretinal laser photocoagulation; IVK intravitreal kenalog; VMT vitreomacular traction; MH Macular hole;  NVD neovascularization of the disc; NVE neovascularization elsewhere; AREDS age related eye disease study; ARMD age related macular degeneration; POAG primary open angle glaucoma; EBMD epithelial/anterior basement membrane dystrophy; ACIOL anterior chamber intraocular lens; IOL intraocular lens; PCIOL posterior chamber intraocular lens; Phaco/IOL phacoemulsification with intraocular lens placement; [place] photorefractive keratectomy; LASIK laser assisted in situ keratomileusis; HTN hypertension; DM diabetes mellitus; COPD chronic obstructive pulmonary disease

## 2017-05-09 DIAGNOSIS — N186 End stage renal disease: Secondary | ICD-10-CM | POA: Diagnosis not present

## 2017-05-09 DIAGNOSIS — N2581 Secondary hyperparathyroidism of renal origin: Secondary | ICD-10-CM | POA: Diagnosis not present

## 2017-05-12 DIAGNOSIS — N2581 Secondary hyperparathyroidism of renal origin: Secondary | ICD-10-CM | POA: Diagnosis not present

## 2017-05-12 DIAGNOSIS — N186 End stage renal disease: Secondary | ICD-10-CM | POA: Diagnosis not present

## 2017-05-14 DIAGNOSIS — N186 End stage renal disease: Secondary | ICD-10-CM | POA: Diagnosis not present

## 2017-05-14 DIAGNOSIS — N2581 Secondary hyperparathyroidism of renal origin: Secondary | ICD-10-CM | POA: Diagnosis not present

## 2017-05-14 NOTE — Progress Notes (Signed)
[facility] Note  05/15/2017     CHIEF COMPLAINT Patient presents for Post-op Follow-up   HISTORY OF PRESENT ILLNESS: [name] is a 72 y.o. female who presents to the clinic today for:   HPI    Post-op Follow-up    In left eye.  Discomfort includes foreign body sensation.  Negative for pain, discharge, none, floaters, tearing and itching.  Vision is improved.  I, the attending physician,  performed the HPI with the patient and updated documentation appropriately.          Comments    72 y/o female pt here for POV2 s/p 25 gauge ppv w/20 gauge mvr port w/endolaser os on 05/07/2017.  Pt doing well, and reports vision os is much improved since last visit on 05/08/2017.  Pt has prosthetic od.  Denies pain, flashes, floaters, but has minor fbs os due to sutures.  Pred 6 times daily os, atropine bid os, zymaxid qid os, bacitracin ung qid os.       Last edited by Cherrie Gauze, COA on 05/15/2017  9:20 AM. (History)       Referring physician: [name], MD [address], [place] [address]  HISTORICAL INFORMATION:   Selected notes from the MEDICAL RECORD NUMBER Referred by Dr. Delane Ginger. Hensel for possible RT OS;  LEE-  Ocular Hx-  PMH-     CURRENT MEDICATIONS: No current outpatient medications on file. (Ophthalmic Drugs)   No current facility-administered medications for this visit.  (Ophthalmic Drugs)   Current Outpatient Medications (Other)  Medication Sig  . albuterol (PROVENTIL) (2.5 MG/3ML) 0.083% nebulizer solution Take 3 mLs (2.5 mg total) by nebulization every 2 (two) hours as needed for shortness of breath.  Marland Kitchen aspirin EC 81 MG tablet Take 81 mg by mouth daily.    Marland Kitchen atorvastatin (LIPITOR) 40 MG tablet Take 40 mg by mouth daily.   . carvedilol (COREG) 25 MG tablet Take 0.5 tablets (12.5 mg total) by mouth 2 (two) times daily. (Patient taking differently: Take 25 mg by mouth 2 (two) times daily. )  . cinacalcet (SENSIPAR) 90  MG tablet Take 1 tablet (90 mg) by mouth daily. On dialysis days, take the dose after dialysis  . diphenhydrAMINE (BENADRYL) 25 mg capsule Take 50 mg by mouth See admin instructions. Take 2 tablets (50 mg) by mouth prior to dialysis on Tuesday, Thursday, Saturday - for itching  . ethyl chloride spray Apply 1 application topically See admin instructions. Apply topically before dialysis on Tuesday, Thursday, Saturday  . guaiFENesin-dextromethorphan (ROBITUSSIN DM) 100-10 MG/5ML syrup Take 5 mLs by mouth every 4 (four) hours while awake. (Patient not taking: Reported on 04/20/2017)  . HYDROcodone-acetaminophen (NORCO) 5-325 MG tablet Take 1 tablet by mouth every 6 (six) hours as needed for moderate pain. (Patient not taking: Reported on 05/07/2017)  . insulin glargine (LANTUS) 100 UNIT/ML injection Inject 0.05 mLs (5 Units total) into the skin at bedtime. (Patient taking differently: Inject 6 Units into the skin at bedtime. )  . midodrine (PROAMATINE) 10 MG tablet Take 10 mg by mouth See admin instructions. Take 10mg  on Tues, Thurs, and Saturday before dialysis  . sevelamer carbonate (RENVELA) 800 MG tablet Take 2-3 tablets (1,600-2,400 mg total) by mouth See admin instructions. Take 3 tablets (2,400 mg) by mouth 3 times daily with meals. Take 2 tablets (1,600 mg) by mouth with snacks. Receives supply from dialysis center.  . tiotropium (SPIRIVA) 18 MCG inhalation capsule Place 1  capsule (18 mcg total) into inhaler and inhale daily. (Patient taking differently: Place 18 mcg into inhaler and inhale daily as needed (shortness of breath). )   No current facility-administered medications for this visit.  (Other)      REVIEW OF SYSTEMS: ROS    Positive for: Endocrine, Eyes   Negative for: Constitutional, Gastrointestinal, Neurological, Skin, Genitourinary, Musculoskeletal, HENT, Cardiovascular, Respiratory, Psychiatric, Allergic/Imm, Heme/Lymph   Last edited by Matthew Folks, COA on 05/15/2017  8:47 AM.  (History)       ALLERGIES Allergies  Allergen Reactions  . Lyrica [Pregabalin] Other (See Comments)    Hallucinations    PAST MEDICAL HISTORY Past Medical History:  Diagnosis Date  . Abdominal abscess 12-17-10   abdominal abscesses x2 ? one at this time  . Anemia   . Arthritis    "all over" (08/23/2012)  . Blind right eye    fell and crushed socket and eye fell out, was replaced at [facility]  . CHF (congestive heart failure) ([place])   . Chronic lower back pain   . Coronary artery disease    normal coronaries by 08/25/12 cath  . Dyspnea   . Dysrhythmia    irregular, "skips beats"  . ESRD (end stage renal disease) on dialysis ([place])    "started 12/2011; [facility]; TTS" (08/23/2012  . Exertional shortness of breath   . Eye drainage    "lots; since fall 07/2011" (08/23/2012)  . Gout   . H/O hiatal hernia   . Headache(784.0)    "weekly sometimes; since I fell and hit my head in 07/2011 08/23/2012)  . Heart murmur   . History of blood transfusion    "lots before starting dialysis" (08/23/2012)  . Hypertension    sees Dr. Willey Blade  . Inhalation injury    "worked at [employer]; can't inhale polyurethane or paint, etc" (08/23/2012)  . Myocardial infarction ([place]) 1970's or 1980's  . Neuromuscular disorder ([place])    diabetic neuropathy  . OSA on CPAP   . Staph aureus infection November 2012   . Swelling of both ankles 12-17-10  . Type II diabetes mellitus ([place])    "over 10 years now" (08/23/2012)   Past Surgical History:  Procedure Laterality Date  . [place] VITRECTOMY WITH 20 GAUGE MVR PORT Left 05/07/2017   Procedure: 25 GAUGE PARS PLANA VITRECTOMY WITH ENDOLASER LEFT EYE;  Surgeon: Bernarda Caffey, MD;  Location: [facility];  Service: Ophthalmology;  Laterality: Left;  . APPENDECTOMY    . AV FISTULA PLACEMENT  09/30/2011   Procedure: ARTERIOVENOUS (AV) FISTULA CREATION;  Surgeon: Conrad Villanueva, MD;  Location: [facility];  Service: Vascular;  Laterality: Left;  BRACHIAL-CEPHALIC   . BACK SURGERY    . BASCILIC VEIN TRANSPOSITION Left 11/10/2011   Left arm   . [place] TRANSPOSITION  01/28/2012   Procedure: BASCILIC VEIN TRANSPOSITION;  Surgeon: Conrad Henry, MD;  Location: [facility];  Service: Vascular;  Laterality: Left;  Second Stage   . CARDIAC CATHETERIZATION  1980's  . CATARACT EXTRACTION W/ INTRAOCULAR LENS IMPLANT Bilateral 1990's  . CORONARY ANGIOGRAPHY N/A 04/07/2017   Procedure: CORONARY ANGIOGRAPHY (CATH LAB);  Surgeon: Dixie Dials, MD;  Location: [facility] CV LAB;  Service: Cardiovascular;  Laterality: N/A;  . DILATION AND CURETTAGE OF UTERUS  1960's  . EYE SURGERY    . FISTULA SUPERFICIALIZATION Left 02/18/2017   Procedure: SUPERFICIALIZATION LEFT ARM ARTERIOVENOUS FISTULA;  Surgeon: Serafina Mitchell, MD;  Location: [facility];  Service: Vascular;  Laterality: Left;  . HERNIA REPAIR    . INSERTION OF DIALYSIS CATHETER  01/05/2012   Procedure: INSERTION OF DIALYSIS CATHETER;  Surgeon: Conrad Wheatland, MD;  Location: [facility];  Service: Vascular;  Laterality: N/A;  Right Internal Jugular Placement  . INTRAOCULAR PROSTHESES INSERTION Right 2013   "fell; knocked my eye outl" (08/23/2012)  . JOINT REPLACEMENT    . LAPAROTOMY  12/18/2010   Procedure: EXPLORATORY LAPAROTOMY;  Surgeon: Rolm Bookbinder, MD;  Location: [facility];  Service: General;  Laterality: N/A;  Abdominal Seroma Evacuation  . LEFT HEART CATHETERIZATION WITH CORONARY ANGIOGRAM N/A 08/25/2012   Procedure: LEFT HEART CATHETERIZATION WITH CORONARY ANGIOGRAM;  Surgeon: Birdie Riddle, MD;  Location: [facility] CATH LAB;  Service: Cardiovascular;  Laterality: N/A;  . LUMBAR DISC SURGERY  1970'-80's   X 3  . TOTAL KNEE ARTHROPLASTY Left 1980's  . VAGINAL HYSTERECTOMY  1970's  . VENTRAL HERNIA REPAIR  2012-2013   component separation, repair with biologic; "had 3 surgeries to fix it" (08/23/2012)  . WOUND DEBRIDEMENT  03/20/2011   Procedure: DEBRIDEMENT ABDOMINAL WOUND;  Surgeon: Rolm Bookbinder, MD;  Location: [facility];   Service: General;  Laterality: N/A;  debridement abdominal wall, placement of wound vac    FAMILY HISTORY Family History  Problem Relation Age of Onset  . Hypertension Mother   . Cancer Brother        spine  . Hypertension Father   . Cancer Sister        brain  . Hypertension Maternal Grandmother   . Anesthesia problems Neg Hx     SOCIAL HISTORY Social History   Tobacco Use  . Smoking status: Former Smoker    Packs/day: 0.25    Years: 16.00    Pack years: 4.00    Types: Cigarettes    Last attempt to quit: 12/16/1980    Years since quitting: 36.4  . Smokeless tobacco: Never Used  Substance Use Topics  . Alcohol use: No  . Drug use: No         OPHTHALMIC EXAM:  Base Eye Exam    Visual Acuity (Snellen - Linear)      Right Left   Dist [place]  20/30   Dist ph [place]  20/30  Pros OD       Tonometry (Tonopen, 8:45 AM)      Right Left   Pressure  11       Pupils      Dark Light Shape React APD   Right        Left 6 6 Round None None  Pros OD.  OS pharm dilated.       Neuro/Psych    Oriented x3:  Yes   Mood/Affect:  Normal       Dilation    Left eye:  1.0% Mydriacyl, 2.5% Phenylephrine @ 8:45 AM        Slit Lamp and Fundus Exam    Slit Lamp Exam      Right Left   Lids/Lashes  Lid retraction   Conjunctiva/Sclera Prosthetic in good postion Melanosis, sutures intact, 360 sub-coj hemorrhage -- improving   Cornea  Arcus, edema resolved   Anterior Chamber  Deep and quiet, no cell   Iris  Round and dilated, No NVI   Lens  Three piece PC IOL in good position, open PC   Vitreous  Post vitrectomy; Vitreous hemorrhage cleared       Fundus Exam  Right Left   Disc  Pink and Sharp   C/D Ratio  0.6   Macula  Flat, Good foveal reflex, Retinal pigment epithelial mottling   Vessels  peripheral sclerosis -- slightly improved, Copper wiring, Vascular attenuation   Periphery  Attached; good 360 peripheral laser          IMAGING AND PROCEDURES  Imaging and  Procedures for 05/15/17           ASSESSMENT/PLAN:    ICD-10-CM   1. Vitreous hemorrhage of left eye (HCC) H43.12   2. Pseudophakia of left eye Z96.1   3. 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 Vitreous hemorrhage OS -  - POW1; S/P 25g PPV/EL/Lucentis injection OS (04.18.19)  - unclear etiology, but suspect combination of HTN, blood thinner, DM2             - did great this week-- VH cleared!, VA 20/30             - IOP good              - dec    PF QID OS                         zymaxid QID OS-- STOP on 4.26.19                         Atropine BID OS-- STOP                         PSO ung QID prn OS             - keep head elevated             - eye shield when sleeping-- continue for 1 more week             - post op drop and positioning instructions reviewed             - tylenol/ibuprofen for pain  - F/U 4 wks   2. Pseudophakia OS-  - s/p CE/IOL  - doing well  - monitor  3. Prosthetic OD-  - prosthetic in good position - traumatic fall w/ open globe repair 2013 (Dr. Lennon Alstrom) --> enucleated by Dr. Toy Cookey - had prosthetic cleaned recently in [facility] - stable  Ophthalmic Meds Ordered this visit:  No orders of the defined types were placed in this encounter.      Return in about 1 month (around 06/12/2017) for POV 3 .  There are no Patient Instructions on file for this visit.   Explained the diagnoses, plan, and follow up with the patient and they expressed understanding.  Patient expressed understanding of the importance of proper follow up care.   This document serves as a record of services personally performed by Gardiner Sleeper, MD, PhD. It was created on their behalf by Catha Brow, [place], a certified ophthalmic assistant. The creation of this record is the provider's dictation and/or activities during the visit.  Electronically signed by: Catha Brow, [place]  05/15/17 9:47 AM   Gardiner Sleeper, M.D., Ph.D. Diseases & Surgery of the Retina and  [place] 05/15/17  I have reviewed the above documentation for accuracy and completeness, and I agree with the above. Gardiner Sleeper, M.D., Ph.D. 05/15/17 9:47 AM    Abbreviations: M myopia (nearsighted); A astigmatism; H hyperopia (  farsighted); P presbyopia; Mrx spectacle prescription;  CTL contact lenses; OD right eye; OS left eye; OU both eyes  XT exotropia; ET esotropia; PEK punctate epithelial keratitis; PEE punctate epithelial erosions; DES dry eye syndrome; MGD meibomian gland dysfunction; ATs artificial tears; PFAT's preservative free artificial tears; [place] nuclear sclerotic cataract; PSC posterior subcapsular cataract; ERM epi-retinal membrane; PVD posterior vitreous detachment; RD retinal detachment; DM diabetes mellitus; DR diabetic retinopathy; NPDR non-proliferative diabetic retinopathy; PDR proliferative diabetic retinopathy; CSME clinically significant macular edema; DME diabetic macular edema; dbh dot blot hemorrhages; CWS cotton wool spot; POAG primary open angle glaucoma; C/D cup-to-disc ratio; HVF humphrey visual field; GVF goldmann visual field; OCT optical coherence tomography; IOP intraocular pressure; BRVO Branch retinal vein occlusion; CRVO central retinal vein occlusion; CRAO central retinal artery occlusion; BRAO branch retinal artery occlusion; RT retinal tear; SB scleral buckle; PPV pars plana vitrectomy; VH Vitreous hemorrhage; PRP panretinal laser photocoagulation; IVK intravitreal kenalog; VMT vitreomacular traction; MH Macular hole;  NVD neovascularization of the disc; NVE neovascularization elsewhere; AREDS age related eye disease study; ARMD age related macular degeneration; POAG primary open angle glaucoma; EBMD epithelial/anterior basement membrane dystrophy; ACIOL anterior chamber intraocular lens; IOL intraocular lens; PCIOL posterior chamber intraocular lens; Phaco/IOL phacoemulsification with intraocular lens placement; [place]  photorefractive keratectomy; LASIK laser assisted in situ keratomileusis; HTN hypertension; DM diabetes mellitus; COPD chronic obstructive pulmonary disease

## 2017-05-15 ENCOUNTER — Encounter (INDEPENDENT_AMBULATORY_CARE_PROVIDER_SITE_OTHER): Payer: Self-pay | Admitting: Ophthalmology

## 2017-05-15 ENCOUNTER — Ambulatory Visit (INDEPENDENT_AMBULATORY_CARE_PROVIDER_SITE_OTHER): Payer: Medicare HMO | Admitting: Ophthalmology

## 2017-05-15 DIAGNOSIS — Z970 Presence of artificial eye: Secondary | ICD-10-CM

## 2017-05-15 DIAGNOSIS — H4312 Vitreous hemorrhage, left eye: Secondary | ICD-10-CM

## 2017-05-15 DIAGNOSIS — Z961 Presence of intraocular lens: Secondary | ICD-10-CM

## 2017-05-16 DIAGNOSIS — N186 End stage renal disease: Secondary | ICD-10-CM | POA: Diagnosis not present

## 2017-05-16 DIAGNOSIS — N2581 Secondary hyperparathyroidism of renal origin: Secondary | ICD-10-CM | POA: Diagnosis not present

## 2017-05-19 DIAGNOSIS — N186 End stage renal disease: Secondary | ICD-10-CM | POA: Diagnosis not present

## 2017-05-19 DIAGNOSIS — N2581 Secondary hyperparathyroidism of renal origin: Secondary | ICD-10-CM | POA: Diagnosis not present

## 2017-05-21 DIAGNOSIS — I5043 Acute on chronic combined systolic (congestive) and diastolic (congestive) heart failure: Secondary | ICD-10-CM | POA: Diagnosis not present

## 2017-05-21 DIAGNOSIS — I270 Primary pulmonary hypertension: Secondary | ICD-10-CM | POA: Diagnosis not present

## 2017-05-21 DIAGNOSIS — G4733 Obstructive sleep apnea (adult) (pediatric): Secondary | ICD-10-CM | POA: Diagnosis not present

## 2017-05-21 DIAGNOSIS — N2581 Secondary hyperparathyroidism of renal origin: Secondary | ICD-10-CM | POA: Diagnosis not present

## 2017-05-21 DIAGNOSIS — N186 End stage renal disease: Secondary | ICD-10-CM | POA: Diagnosis not present

## 2017-05-21 DIAGNOSIS — J449 Chronic obstructive pulmonary disease, unspecified: Secondary | ICD-10-CM | POA: Diagnosis not present

## 2017-05-21 DIAGNOSIS — R0901 Asphyxia: Secondary | ICD-10-CM | POA: Diagnosis not present

## 2017-05-21 DIAGNOSIS — R0602 Shortness of breath: Secondary | ICD-10-CM | POA: Diagnosis not present

## 2017-05-22 DIAGNOSIS — Z452 Encounter for adjustment and management of vascular access device: Secondary | ICD-10-CM | POA: Diagnosis not present

## 2017-05-23 DIAGNOSIS — N186 End stage renal disease: Secondary | ICD-10-CM | POA: Diagnosis not present

## 2017-05-23 DIAGNOSIS — N2581 Secondary hyperparathyroidism of renal origin: Secondary | ICD-10-CM | POA: Diagnosis not present

## 2017-05-26 DIAGNOSIS — N2581 Secondary hyperparathyroidism of renal origin: Secondary | ICD-10-CM | POA: Diagnosis not present

## 2017-05-26 DIAGNOSIS — N186 End stage renal disease: Secondary | ICD-10-CM | POA: Diagnosis not present

## 2017-05-27 DIAGNOSIS — M810 Age-related osteoporosis without current pathological fracture: Secondary | ICD-10-CM | POA: Diagnosis not present

## 2017-05-27 DIAGNOSIS — E7849 Other hyperlipidemia: Secondary | ICD-10-CM | POA: Diagnosis not present

## 2017-05-27 DIAGNOSIS — E119 Type 2 diabetes mellitus without complications: Secondary | ICD-10-CM | POA: Diagnosis not present

## 2017-05-27 DIAGNOSIS — M109 Gout, unspecified: Secondary | ICD-10-CM | POA: Diagnosis not present

## 2017-05-28 DIAGNOSIS — N2581 Secondary hyperparathyroidism of renal origin: Secondary | ICD-10-CM | POA: Diagnosis not present

## 2017-05-28 DIAGNOSIS — N186 End stage renal disease: Secondary | ICD-10-CM | POA: Diagnosis not present

## 2017-05-30 DIAGNOSIS — N186 End stage renal disease: Secondary | ICD-10-CM | POA: Diagnosis not present

## 2017-05-30 DIAGNOSIS — N2581 Secondary hyperparathyroidism of renal origin: Secondary | ICD-10-CM | POA: Diagnosis not present

## 2017-06-02 DIAGNOSIS — N2581 Secondary hyperparathyroidism of renal origin: Secondary | ICD-10-CM | POA: Diagnosis not present

## 2017-06-02 DIAGNOSIS — N186 End stage renal disease: Secondary | ICD-10-CM | POA: Diagnosis not present

## 2017-06-03 ENCOUNTER — Other Ambulatory Visit (INDEPENDENT_AMBULATORY_CARE_PROVIDER_SITE_OTHER): Payer: Self-pay

## 2017-06-03 ENCOUNTER — Encounter (INDEPENDENT_AMBULATORY_CARE_PROVIDER_SITE_OTHER): Payer: Self-pay | Admitting: Ophthalmology

## 2017-06-03 ENCOUNTER — Ambulatory Visit (INDEPENDENT_AMBULATORY_CARE_PROVIDER_SITE_OTHER): Payer: Medicare HMO | Admitting: Ophthalmology

## 2017-06-03 DIAGNOSIS — J449 Chronic obstructive pulmonary disease, unspecified: Secondary | ICD-10-CM | POA: Diagnosis not present

## 2017-06-03 DIAGNOSIS — Z970 Presence of artificial eye: Secondary | ICD-10-CM

## 2017-06-03 DIAGNOSIS — Z992 Dependence on renal dialysis: Secondary | ICD-10-CM | POA: Diagnosis not present

## 2017-06-03 DIAGNOSIS — I509 Heart failure, unspecified: Secondary | ICD-10-CM | POA: Diagnosis not present

## 2017-06-03 DIAGNOSIS — N183 Chronic kidney disease, stage 3 (moderate): Secondary | ICD-10-CM | POA: Diagnosis not present

## 2017-06-03 DIAGNOSIS — H3581 Retinal edema: Secondary | ICD-10-CM | POA: Diagnosis not present

## 2017-06-03 DIAGNOSIS — G4733 Obstructive sleep apnea (adult) (pediatric): Secondary | ICD-10-CM | POA: Diagnosis not present

## 2017-06-03 DIAGNOSIS — E1169 Type 2 diabetes mellitus with other specified complication: Secondary | ICD-10-CM | POA: Diagnosis not present

## 2017-06-03 DIAGNOSIS — H43812 Vitreous degeneration, left eye: Secondary | ICD-10-CM

## 2017-06-03 DIAGNOSIS — I132 Hypertensive heart and chronic kidney disease with heart failure and with stage 5 chronic kidney disease, or end stage renal disease: Secondary | ICD-10-CM | POA: Diagnosis not present

## 2017-06-03 DIAGNOSIS — Z1212 Encounter for screening for malignant neoplasm of rectum: Secondary | ICD-10-CM | POA: Diagnosis not present

## 2017-06-03 DIAGNOSIS — Z0000 Encounter for general adult medical examination without abnormal findings: Secondary | ICD-10-CM | POA: Diagnosis not present

## 2017-06-03 DIAGNOSIS — I10 Essential (primary) hypertension: Secondary | ICD-10-CM | POA: Diagnosis not present

## 2017-06-03 DIAGNOSIS — Z961 Presence of intraocular lens: Secondary | ICD-10-CM

## 2017-06-03 DIAGNOSIS — H4312 Vitreous hemorrhage, left eye: Secondary | ICD-10-CM

## 2017-06-03 MED ORDER — PREDNISOLONE ACETATE 1 % OP SUSP: 1.0000 [drp] | Freq: Four times a day (QID) | OPHTHALMIC | 1 refills | Status: DC

## 2017-06-03 MED ORDER — BACITRACIN-POLYMYXIN B 500-10000 UNIT/GM OP OINT: TOPICAL_OINTMENT | Freq: Four times a day (QID) | OPHTHALMIC | 0 refills | Status: DC

## 2017-06-03 NOTE — Progress Notes (Signed)
[facility] Note  06/03/2017     CHIEF COMPLAINT Patient presents for Retina Follow Up   HISTORY OF PRESENT ILLNESS: [name] is a 72 y.o. female who presents to the clinic today for:   HPI    Retina Follow Up    Patient presents with  Other.  In left eye.  This started 1 month ago.  Severity is mild.  Since onset it is gradually worsening.  I, the attending physician,  performed the HPI with the patient and updated documentation appropriately.          Comments    POV 3 S/P 25g PPV w/20gMRV Port w/endolaser OS (05/07/17).Patient states she ran out of gtt's Sunday(05/31/17), her eye os became sore, as time went the sorer her eye came. "its really sore when I blink".   Patient has prosthetic OD       Last edited by Bernarda Caffey, MD on 06/03/2017  1:59 PM. (History)       Referring physician: [name], MD [place], [place] [address]  HISTORICAL INFORMATION:   Selected notes from the MEDICAL RECORD NUMBER Referred by Dr. Delane Ginger. Hensel for possible RT OS;  LEE-  Ocular Hx-  PMH-     CURRENT MEDICATIONS: Current Outpatient Medications (Ophthalmic Drugs)  Medication Sig  . bacitracin-polymyxin b (POLYSPORIN) ophthalmic ointment Place into the left eye 4 (four) times daily. Place a 1/2 inch ribbon of ointment into the lower eyelid.  . prednisoLONE acetate (PRED FORTE) 1 % ophthalmic suspension Place 1 drop into the left eye 4 (four) times daily. (Patient not taking: Reported on 06/03/2017)   No current facility-administered medications for this visit.  (Ophthalmic Drugs)   Current Outpatient Medications (Other)  Medication Sig  . albuterol (PROVENTIL) (2.5 MG/3ML) 0.083% nebulizer solution Take 3 mLs (2.5 mg total) by nebulization every 2 (two) hours as needed for shortness of breath.  Marland Kitchen aspirin EC 81 MG tablet Take 81 mg by mouth daily.    Marland Kitchen atorvastatin (LIPITOR) 40 MG tablet Take 40 mg by mouth daily.   . carvedilol  (COREG) 25 MG tablet Take 0.5 tablets (12.5 mg total) by mouth 2 (two) times daily. (Patient taking differently: Take 25 mg by mouth 2 (two) times daily. )  . cinacalcet (SENSIPAR) 90 MG tablet Take 1 tablet (90 mg) by mouth daily. On dialysis days, take the dose after dialysis  . diphenhydrAMINE (BENADRYL) 25 mg capsule Take 50 mg by mouth See admin instructions. Take 2 tablets (50 mg) by mouth prior to dialysis on Tuesday, Thursday, Saturday - for itching  . ethyl chloride spray Apply 1 application topically See admin instructions. Apply topically before dialysis on Tuesday, Thursday, Saturday  . guaiFENesin-dextromethorphan (ROBITUSSIN DM) 100-10 MG/5ML syrup Take 5 mLs by mouth every 4 (four) hours while awake.  Marland Kitchen HYDROcodone-acetaminophen (NORCO) 5-325 MG tablet Take 1 tablet by mouth every 6 (six) hours as needed for moderate pain.  Marland Kitchen insulin glargine (LANTUS) 100 UNIT/ML injection Inject 0.05 mLs (5 Units total) into the skin at bedtime. (Patient taking differently: Inject 6 Units into the skin at bedtime. )  . midodrine (PROAMATINE) 10 MG tablet Take 10 mg by mouth See admin instructions. Take 10mg  on Tues, Thurs, and Saturday before dialysis  . sevelamer carbonate (RENVELA) 800 MG tablet Take 2-3 tablets (1,600-2,400 mg total) by mouth See admin instructions. Take 3 tablets (2,400 mg) by mouth 3 times daily with meals. Take 2 tablets (1,600 mg)  by mouth with snacks. Receives supply from dialysis center.  . tiotropium (SPIRIVA) 18 MCG inhalation capsule Place 1 capsule (18 mcg total) into inhaler and inhale daily. (Patient taking differently: Place 18 mcg into inhaler and inhale daily as needed (shortness of breath). )   No current facility-administered medications for this visit.  (Other)      REVIEW OF SYSTEMS: ROS    Positive for: Musculoskeletal, Endocrine, Cardiovascular, Eyes, Respiratory   Negative for: Constitutional, Gastrointestinal, Neurological, Skin, Genitourinary, HENT,  Psychiatric, Allergic/Imm, Heme/Lymph   Last edited by Zenovia Jordan, LPN on 05/16/621  7:62 PM. (History)       ALLERGIES Allergies  Allergen Reactions  . Lyrica [Pregabalin] Other (See Comments)    Hallucinations    PAST MEDICAL HISTORY Past Medical History:  Diagnosis Date  . Abdominal abscess 12-17-10   abdominal abscesses x2 ? one at this time  . Anemia   . Arthritis    "all over" (08/23/2012)  . Blind right eye    fell and crushed socket and eye fell out, was replaced at [facility]  . CHF (congestive heart failure) ([place])   . Chronic lower back pain   . Coronary artery disease    normal coronaries by 08/25/12 cath  . Dyspnea   . Dysrhythmia    irregular, "skips beats"  . ESRD (end stage renal disease) on dialysis ([place])    "started 12/2011; [facility]; TTS" (08/23/2012  . Exertional shortness of breath   . Eye drainage    "lots; since fall 07/2011" (08/23/2012)  . Gout   . H/O hiatal hernia   . Headache(784.0)    "weekly sometimes; since I fell and hit my head in 07/2011 08/23/2012)  . Heart murmur   . History of blood transfusion    "lots before starting dialysis" (08/23/2012)  . Hypertension    sees Dr. Willey Blade  . Inhalation injury    "worked at [employer]; can't inhale polyurethane or paint, etc" (08/23/2012)  . Myocardial infarction ([place]) 1970's or 1980's  . Neuromuscular disorder ([place])    diabetic neuropathy  . OSA on CPAP   . Staph aureus infection November 2012   . Swelling of both ankles 12-17-10  . Type II diabetes mellitus ([place])    "over 10 years now" (08/23/2012)   Past Surgical History:  Procedure Laterality Date  . [place] VITRECTOMY WITH 20 GAUGE MVR PORT Left 05/07/2017   Procedure: 25 GAUGE PARS [place]NA VITRECTOMY WITH ENDOLASER LEFT EYE;  Surgeon: Bernarda Caffey, MD;  Location: [facility];  Service: Ophthalmology;  Laterality: Left;  . APPENDECTOMY    . AV FISTULA PLACEMENT  09/30/2011   Procedure: ARTERIOVENOUS (AV) FISTULA CREATION;   Surgeon: Conrad Eagle, MD;  Location: [facility];  Service: Vascular;  Laterality: Left;  BRACHIAL-CEPHALIC  . BACK SURGERY    . BASCILIC VEIN TRANSPOSITION Left 11/10/2011   Left arm   . [place] TRANSPOSITION  01/28/2012   Procedure: BASCILIC VEIN TRANSPOSITION;  Surgeon: Conrad , MD;  Location: [facility];  Service: Vascular;  Laterality: Left;  Second Stage   . CARDIAC CATHETERIZATION  1980's  . CATARACT EXTRACTION W/ INTRAOCULAR LENS IMPLANT Bilateral 1990's  . CORONARY ANGIOGRAPHY N/A 04/07/2017   Procedure: CORONARY ANGIOGRAPHY (CATH LAB);  Surgeon: Dixie Dials, MD;  Location: [facility] CV LAB;  Service: Cardiovascular;  Laterality: N/A;  . DILATION AND CURETTAGE OF UTERUS  1960's  . EYE SURGERY    . FISTULA SUPERFICIALIZATION Left 02/18/2017  Procedure: SUPERFICIALIZATION LEFT ARM ARTERIOVENOUS FISTULA;  Surgeon: Serafina Mitchell, MD;  Location: [facility];  Service: Vascular;  Laterality: Left;  . HERNIA REPAIR    . INSERTION OF DIALYSIS CATHETER  01/05/2012   Procedure: INSERTION OF DIALYSIS CATHETER;  Surgeon: Conrad Mescal, MD;  Location: [facility];  Service: Vascular;  Laterality: N/A;  Right Internal Jugular Placement  . INTRAOCULAR PROSTHESES INSERTION Right 2013   "fell; knocked my eye outl" (08/23/2012)  . JOINT REPLACEMENT    . LAPAROTOMY  12/18/2010   Procedure: EXPLORATORY LAPAROTOMY;  Surgeon: Rolm Bookbinder, MD;  Location: [facility];  Service: General;  Laterality: N/A;  Abdominal Seroma Evacuation  . LEFT HEART CATHETERIZATION WITH CORONARY ANGIOGRAM N/A 08/25/2012   Procedure: LEFT HEART CATHETERIZATION WITH CORONARY ANGIOGRAM;  Surgeon: Birdie Riddle, MD;  Location: [facility] CATH LAB;  Service: Cardiovascular;  Laterality: N/A;  . LUMBAR DISC SURGERY  1970'-80's   X 3  . TOTAL KNEE ARTHROPLASTY Left 1980's  . VAGINAL HYSTERECTOMY  1970's  . VENTRAL HERNIA REPAIR  2012-2013   component separation, repair with biologic; "had 3 surgeries to fix it" (08/23/2012)  . WOUND DEBRIDEMENT   03/20/2011   Procedure: DEBRIDEMENT ABDOMINAL WOUND;  Surgeon: Rolm Bookbinder, MD;  [facility];  Service: General;  Laterality: N/A;  debridement abdominal wall, placement of wound vac    FAMILY HISTORY Family History  Problem Relation Age of Onset  . Hypertension Mother   . Cancer Brother        spine  . Hypertension Father   . Cancer Sister        brain  . Hypertension Maternal Grandmother   . Anesthesia problems Neg Hx     SOCIAL HISTORY Social History   Tobacco Use  . Smoking status: Former Smoker    Packs/day: 0.25    Years: 16.00    Pack years: 4.00    Types: Cigarettes    Last attempt to quit: 12/16/1980    Years since quitting: 36.4  . Smokeless tobacco: Never Used  Substance Use Topics  . Alcohol use: No  . Drug use: No         OPHTHALMIC EXAM:  Base Eye Exam    Visual Acuity (Snellen - Linear)      Right Left   [place] prosthetic 20/40   Dist ph [place]  20/30       Tonometry (Tonopen, 1:30 PM)      Right Left   Pressure pros 18       Pupils      Dark Light Shape React APD   Right pros       Left 6 6 Round Slow None       Neuro/Psych    Oriented x3:  Yes   Mood/Affect:  Normal       Dilation    Both eyes:  1.0% Mydriacyl, Paremyd @ 1:29 PM        Slit Lamp and Fundus Exam    Slit Lamp Exam      Right Left   Lids/Lashes  Lid retraction   Conjunctiva/Sclera Prosthetic in good postion Melanosis, sutures desolving -- mild injection around dissolving sutures   Cornea  Arcus, edema resolved   Anterior Chamber  0.5+ cell/pigment   Iris  Round and dilated, No NVI   Lens  Three piece PC IOL in good position, open PC   Vitreous  Post vitrectomy; Vitreous hemorrhage cleared       Fundus Exam  Right Left   Disc  Pink and Sharp, central Pallor   C/D Ratio  0.65   Macula  Flat, Good foveal reflex, Retinal pigment epithelial mottling   Vessels  peripheral sclerosis -- slightly improved, Copper wiring, Vascular attenuation, AV  crossing changes   Periphery  Attached; good 360 peripheral laser          IMAGING AND PROCEDURES  Imaging and Procedures for 05/15/17  OCT, Retina - OU - Both Eyes       Right Eye Quality was (Prosthetic).   Left Eye Quality was poor. Central Foveal Thickness: 243. Progression has improved. Findings include normal foveal contour, no IRF, no SRF (VH resolved).   Notes *Images captured and stored on drive  Diagnosis / Impression:  OD: Prosthetic OS: NFP, No IRF/SRF -- VH resolved  Clinical management:  See below  Abbreviations: NFP - Normal foveal profile. CME - cystoid macular edema. PED - pigment epithelial detachment. IRF - intraretinal fluid. SRF - subretinal fluid. EZ - ellipsoid zone. ERM - epiretinal membrane. ORA - outer retinal atrophy. ORT - outer retinal tubulation. SRHM - subretinal hyper-reflective material                  ASSESSMENT/PLAN:    ICD-10-CM   1. Vitreous hemorrhage of left eye (HCC) H43.12   2. Pseudophakia of left eye Z96.1   3. 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 Posterior vitreous detachment of left eye H43.812   5. Retinal edema H35.81 OCT, Retina - OU - Both Eyes    1. Vitreous hemorrhage OS -  - POW4; S/P 25g PPV/EL/Lucentis injection OS (04.18.19)  - unclear etiology, but suspect combination of HTN, blood thinner, DM2             - doing well-- VH remains cleared, VA 20/30             - IOP good   - ran out of PF on Saturday and has had some mild soreness OS since running out             - restart PF QID OS  - cont  PSO ung QID prn OS             - keep head elevated             - dc eye shield when sleeping             - tylenol/ibuprofen for pain  - F/U 3 wks -- can cancel next week's appt   2. Pseudophakia OS-  - s/p CE/IOL  - doing well  - monitor  3. Prosthetic OD-  - prosthetic in good position - traumatic fall w/ open globe repair 2013 (Dr. Lennon Alstrom) --> enucleated by Dr. Toy Cookey - had prosthetic cleaned  recently in [facility] - stable  Ophthalmic Meds Ordered this visit:  Meds ordered this encounter  Medications  . bacitracin-polymyxin b (POLYSPORIN) ophthalmic ointment    Sig: Place into the left eye 4 (four) times daily. Place a 1/2 inch ribbon of ointment into the lower eyelid.    Dispense:  3.5 g    Refill:  0       Return in about 3 weeks (around 06/24/2017) for POV.  There are no Patient Instructions on file for this visit.   Explained the diagnoses, plan, and follow up with the patient and they expressed understanding.  Patient expressed understanding of the importance of proper follow up care.   This document  serves as a record of services personally performed by Gardiner Sleeper, MD, PhD. It was created on their behalf by Catha Brow, [place], a certified ophthalmic assistant. The creation of this record is the provider's dictation and/or activities during the visit.  Electronically signed by: Catha Brow, COA  05.15.19 3:36 PM    Gardiner Sleeper, M.D., Ph.D. Diseases & Surgery of the Retina and [place] 06/03/17  I have reviewed the above documentation for accuracy and completeness, and I agree with the above. Gardiner Sleeper, M.D., Ph.D. 06/03/17 3:39 PM     Abbreviations: M myopia (nearsighted); A astigmatism; H hyperopia (farsighted); P presbyopia; Mrx spectacle prescription;  CTL contact lenses; OD right eye; OS left eye; OU both eyes  XT exotropia; ET esotropia; PEK punctate epithelial keratitis; PEE punctate epithelial erosions; DES dry eye syndrome; MGD meibomian gland dysfunction; ATs artificial tears; PFAT's preservative free artificial tears; [place] nuclear sclerotic cataract; PSC posterior subcapsular cataract; ERM epi-retinal membrane; PVD posterior vitreous detachment; RD retinal detachment; DM diabetes mellitus; DR diabetic retinopathy; NPDR non-proliferative diabetic retinopathy; PDR proliferative diabetic retinopathy;  CSME clinically significant macular edema; DME diabetic macular edema; dbh dot blot hemorrhages; CWS cotton wool spot; POAG primary open angle glaucoma; C/D cup-to-disc ratio; HVF humphrey visual field; GVF goldmann visual field; OCT optical coherence tomography; IOP intraocular pressure; BRVO Branch retinal vein occlusion; CRVO central retinal vein occlusion; CRAO central retinal artery occlusion; BRAO branch retinal artery occlusion; RT retinal tear; SB scleral buckle; PPV pars plana vitrectomy; VH Vitreous hemorrhage; PRP panretinal laser photocoagulation; IVK intravitreal kenalog; VMT vitreomacular traction; MH Macular hole;  NVD neovascularization of the disc; NVE neovascularization elsewhere; AREDS age related eye disease study; ARMD age related macular degeneration; POAG primary open angle glaucoma; EBMD epithelial/anterior basement membrane dystrophy; ACIOL anterior chamber intraocular lens; IOL intraocular lens; PCIOL posterior chamber intraocular lens; Phaco/IOL phacoemulsification with intraocular lens placement; [place] photorefractive keratectomy; LASIK laser assisted in situ keratomileusis; HTN hypertension; DM diabetes mellitus; COPD chronic obstructive pulmonary disease

## 2017-06-03 NOTE — Telephone Encounter (Signed)
Pt called stating she had run out of PF x 3 days ago, stated OS is red, painful, and very tender to touch; Called pt back and asked her what time she would be able to come in today, pt stated she has a doctors appointment at 9:00 AM, but will try to come to office after that; Advised pt to call as soon as she knows what time she will be here so we are able to get her on the schedule; Sent in refill authorization for PF to be used OS QID to [facility] on [address] in [place];   [place], [place]

## 2017-06-04 DIAGNOSIS — N2581 Secondary hyperparathyroidism of renal origin: Secondary | ICD-10-CM | POA: Diagnosis not present

## 2017-06-04 DIAGNOSIS — N186 End stage renal disease: Secondary | ICD-10-CM | POA: Diagnosis not present

## 2017-06-06 DIAGNOSIS — N186 End stage renal disease: Secondary | ICD-10-CM | POA: Diagnosis not present

## 2017-06-06 DIAGNOSIS — N2581 Secondary hyperparathyroidism of renal origin: Secondary | ICD-10-CM | POA: Diagnosis not present

## 2017-06-08 IMAGING — CR DG CHEST 2V
2 series · 2 of 2 positions shown · non-contrast
Comparison: Chest radiograph performed 07/28/2015

CLINICAL DATA: Acute onset of shortness of breath. Initial
encounter.

EXAM:
CHEST  2 VIEW

[chest lat]
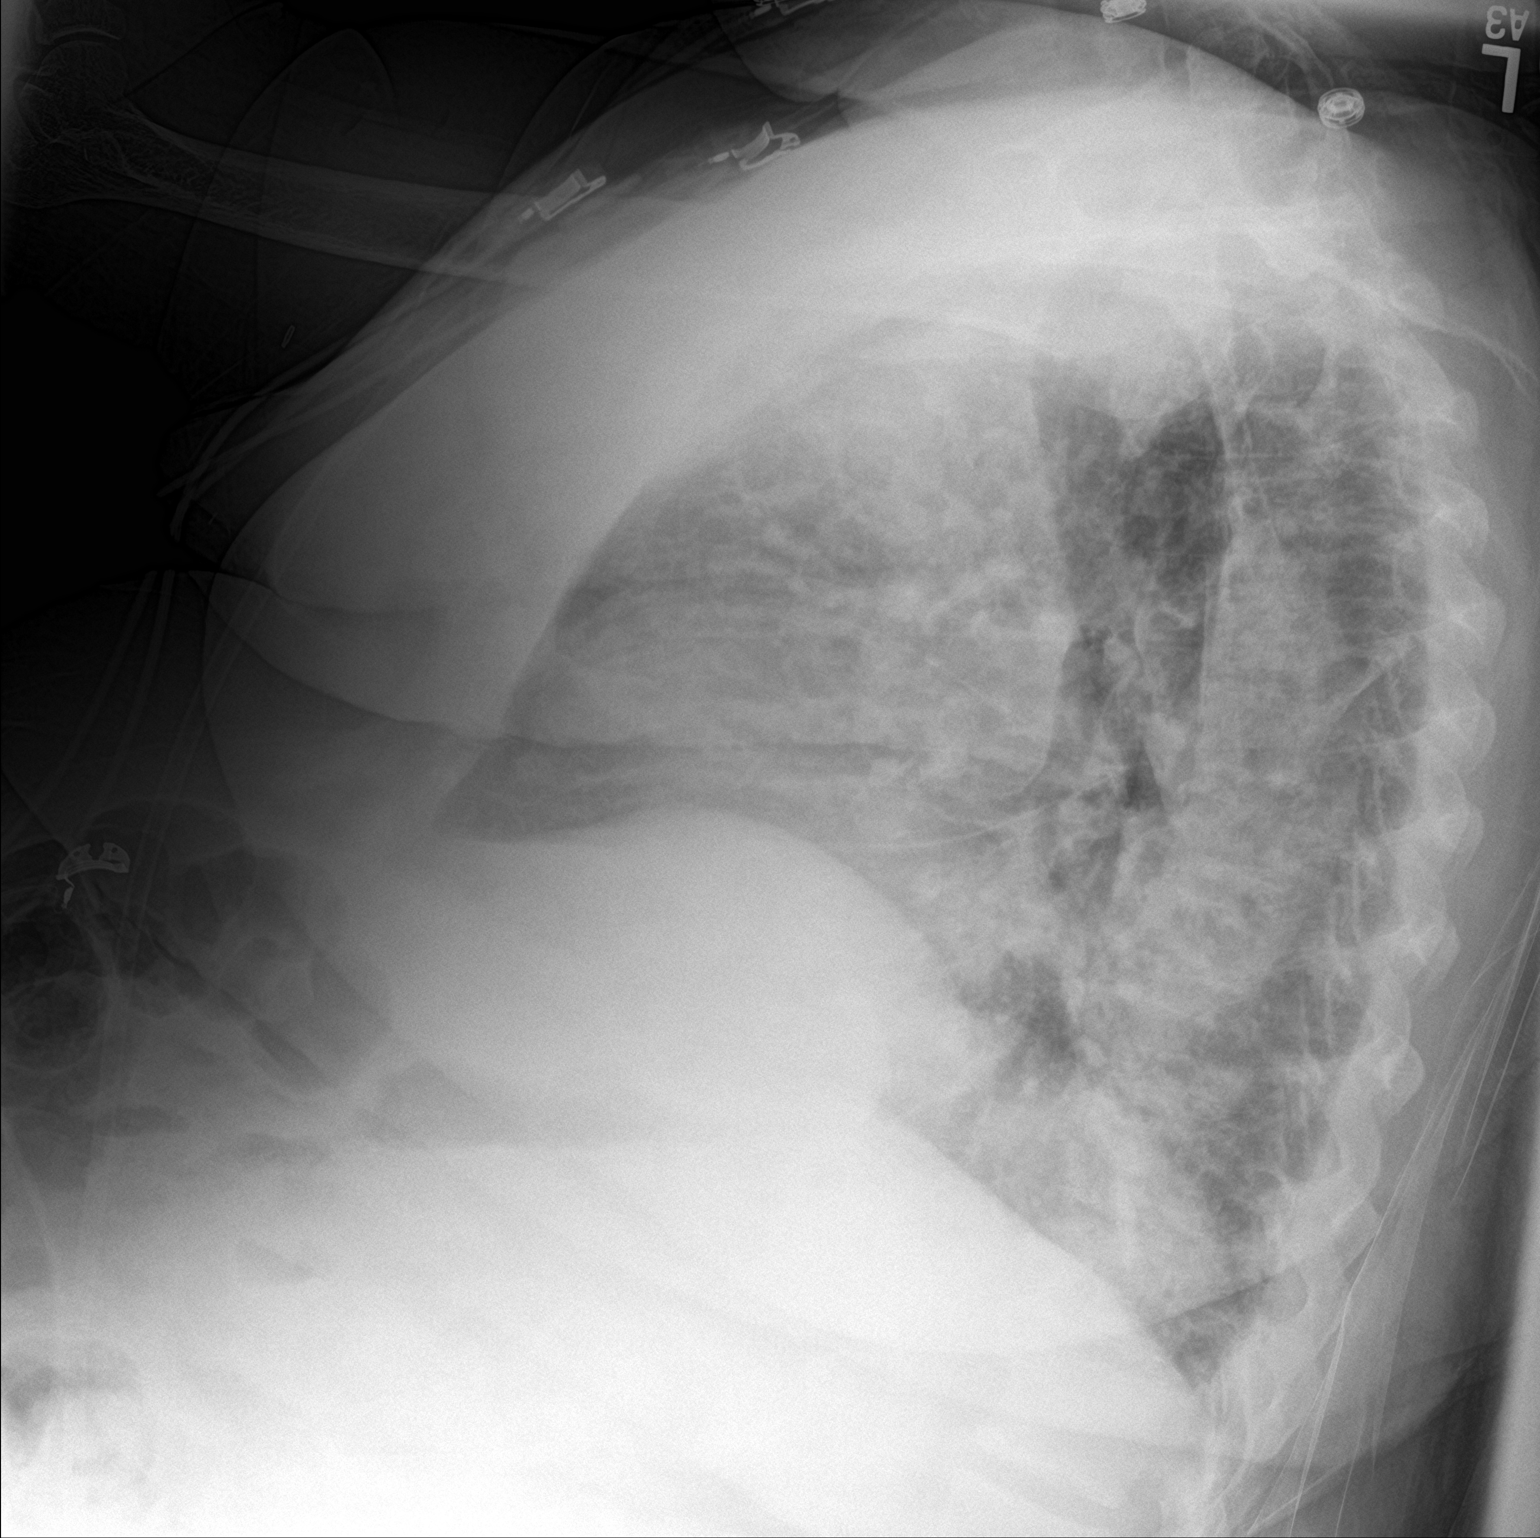

[chest ap]
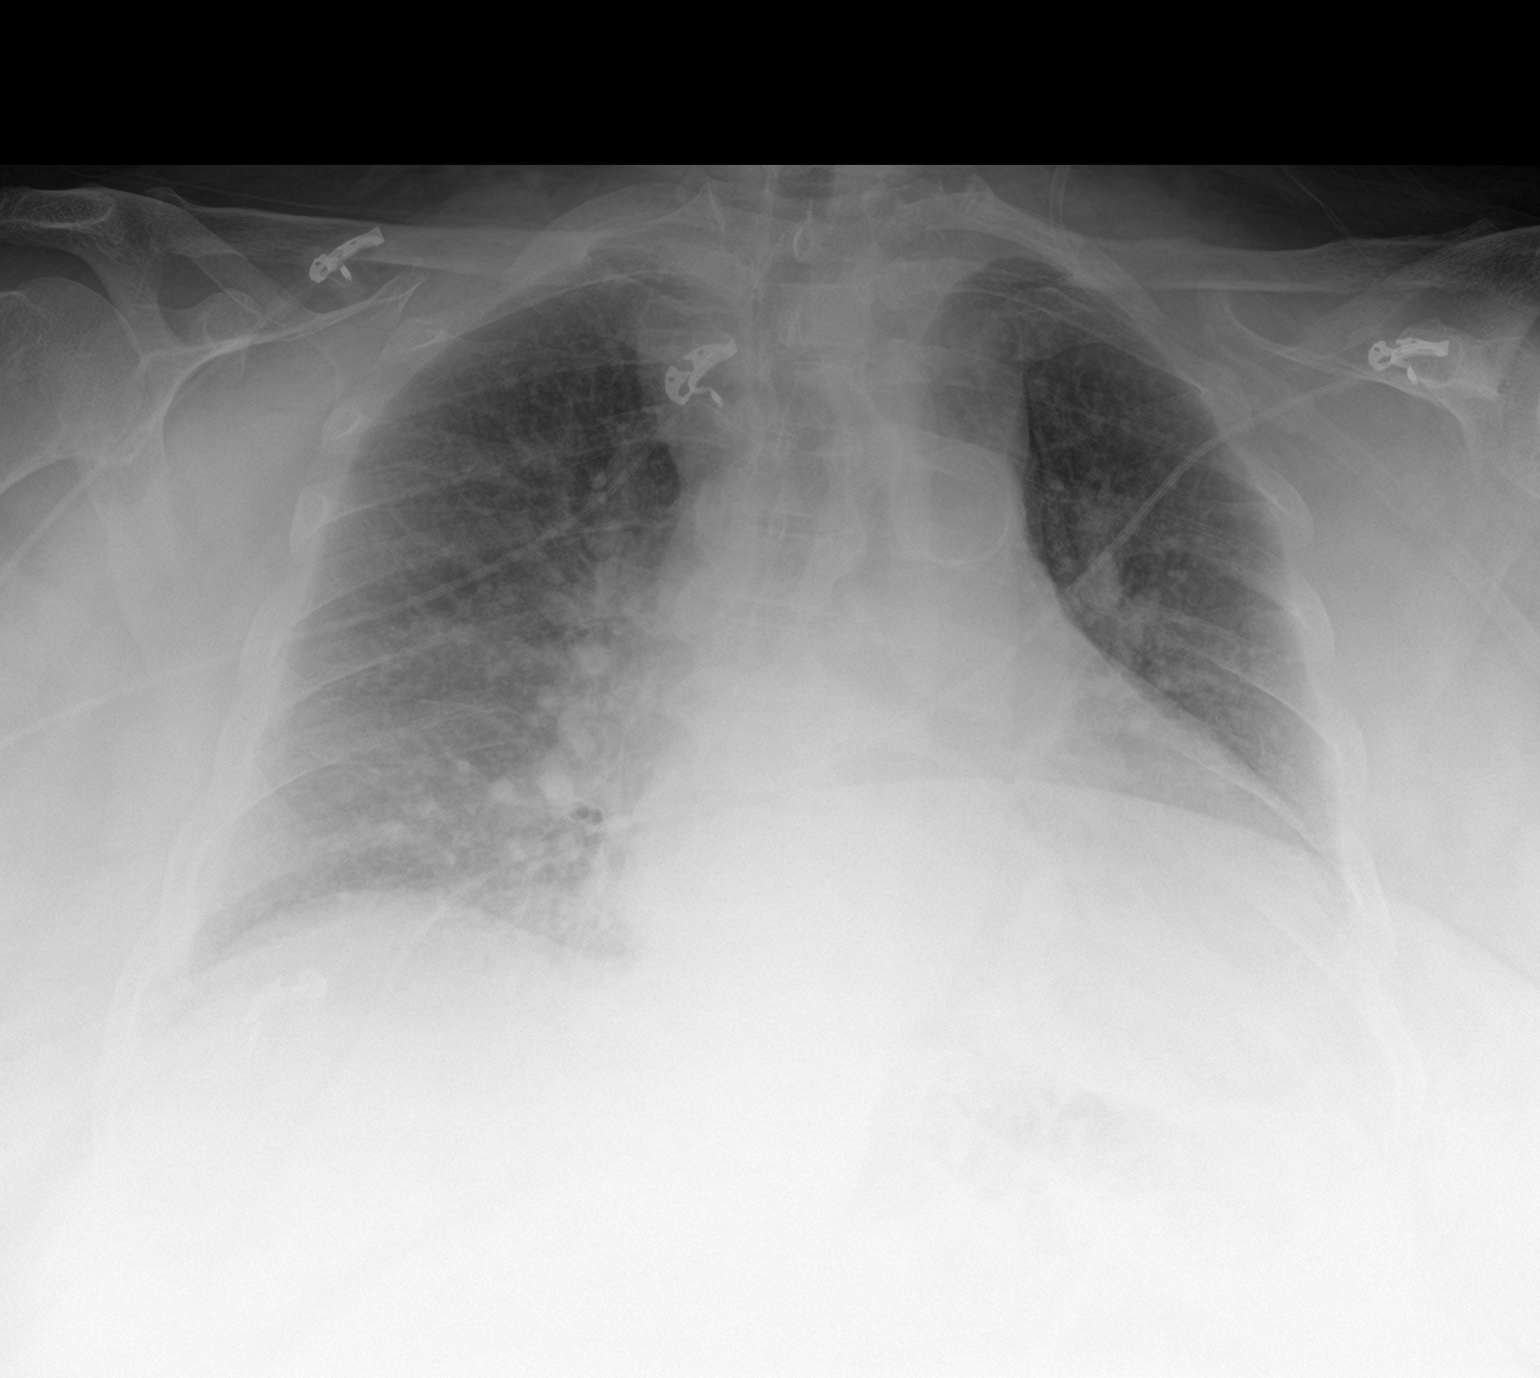

[2 of 2 positions shown; findings below may reference images not displayed]

FINDINGS: The lungs are well-aerated. Vascular congestion is noted. Increased
interstitial markings raise concern for mild interstitial edema.
There is no evidence of pleural effusion or pneumothorax.

The heart is borderline enlarged. No acute osseous abnormalities are
seen.
IMPRESSION: Vascular congestion and borderline cardiomegaly. Increased
interstitial markings raise concern for mild interstitial edema.

## 2017-06-09 DIAGNOSIS — N2581 Secondary hyperparathyroidism of renal origin: Secondary | ICD-10-CM | POA: Diagnosis not present

## 2017-06-09 DIAGNOSIS — N186 End stage renal disease: Secondary | ICD-10-CM | POA: Diagnosis not present

## 2017-06-11 DIAGNOSIS — N186 End stage renal disease: Secondary | ICD-10-CM | POA: Diagnosis not present

## 2017-06-11 DIAGNOSIS — N2581 Secondary hyperparathyroidism of renal origin: Secondary | ICD-10-CM | POA: Diagnosis not present

## 2017-06-11 IMAGING — CR DG CHEST 1V PORT
1 series · 1 of 1 positions shown · non-contrast
Comparison: 01/30/2016

CLINICAL DATA: SOB x3 weeks. Pulmonary edema.Hx of CHF, DM, HTN,
MI, CAD, ESRD.

EXAM:
PORTABLE CHEST 1 VIEW

[AP]
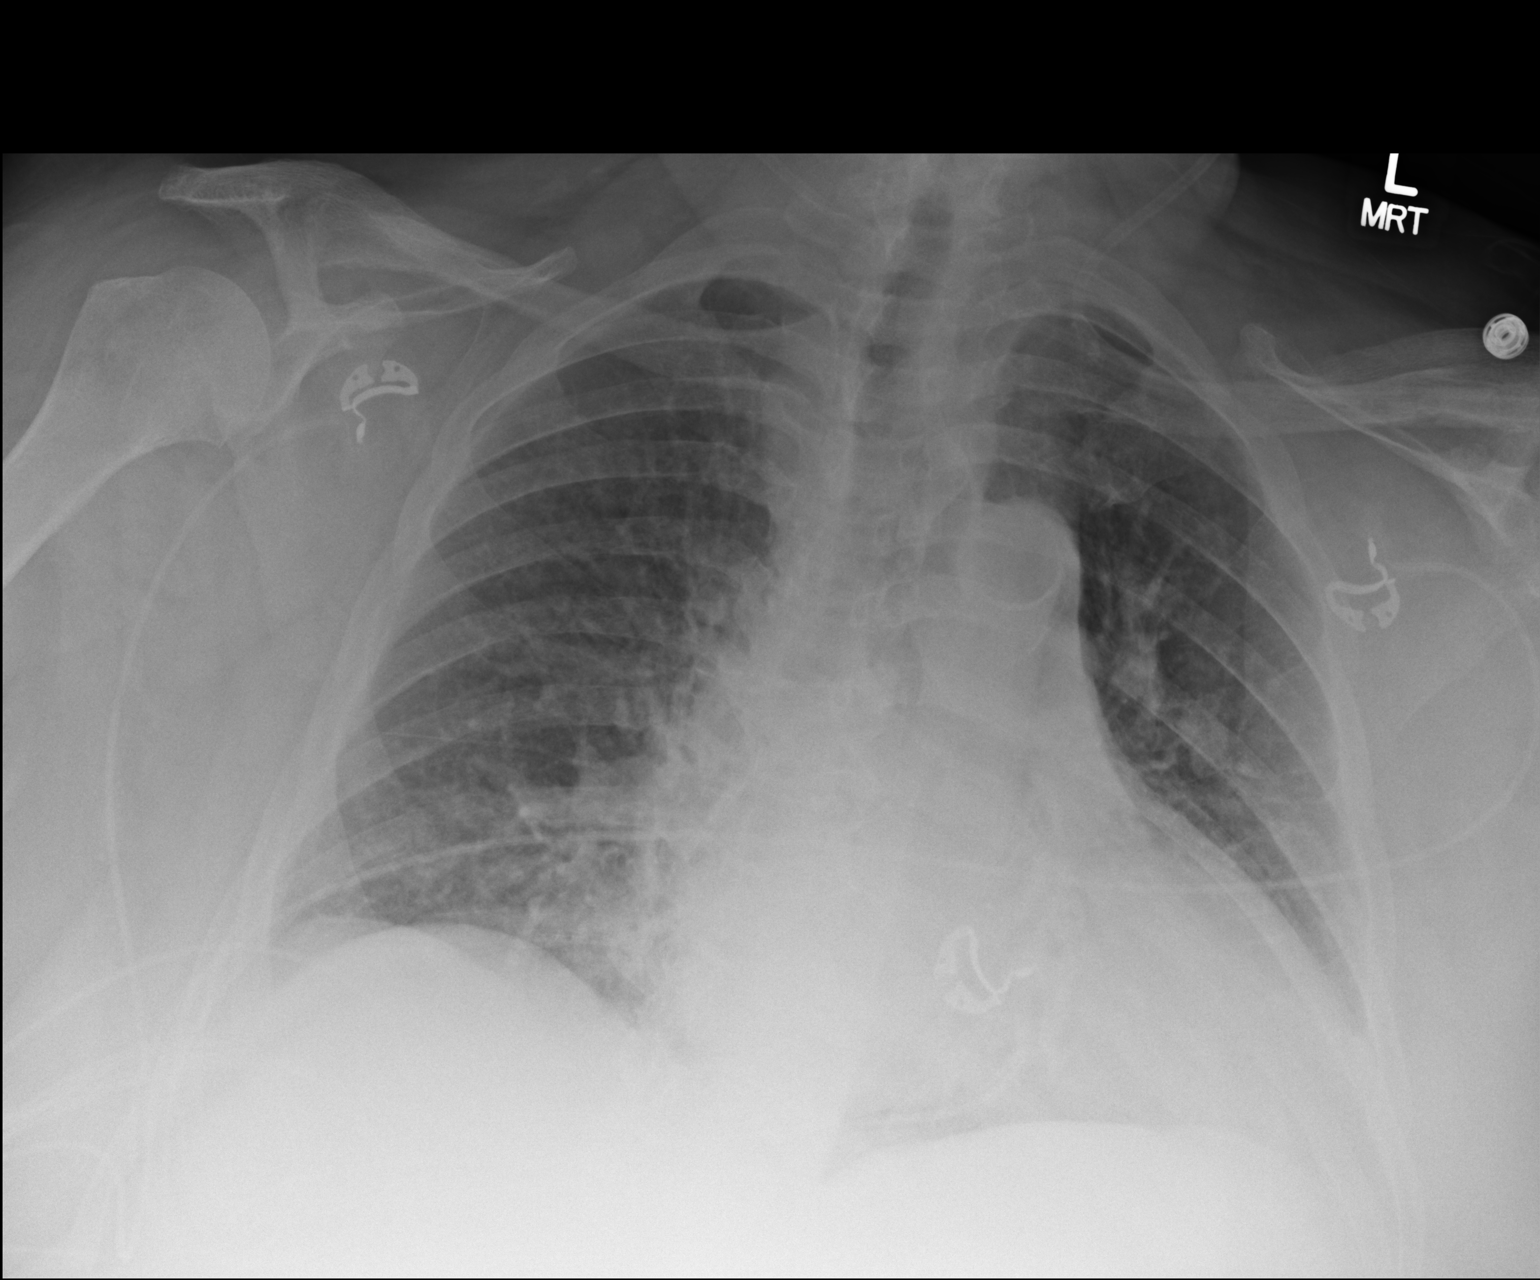

[1 of 1 positions shown; findings below may reference images not displayed]

FINDINGS: Cardiac silhouette is mildly enlarged. No mediastinal or hilar
masses.

Mild vascular prominence bilaterally, stable. No overt pulmonary
edema. No lung consolidation to suggest pneumonia.

No pleural effusion.  No pneumothorax.

Skeletal structures are intact.
IMPRESSION: No acute cardiopulmonary disease.

## 2017-06-12 ENCOUNTER — Encounter (INDEPENDENT_AMBULATORY_CARE_PROVIDER_SITE_OTHER): Payer: Medicare HMO | Admitting: Ophthalmology

## 2017-06-12 IMAGING — CR DG CHEST 1V PORT
1 series · 1 of 1 positions shown · non-contrast
Comparison: 02/02/2016

CLINICAL DATA: Chest bilat diffuse coarse exp wheezing and rhonchi,
stats dropped repeatedly overnight. Pt coughing up phlegm and SOB.

EXAM:
PORTABLE CHEST 1 VIEW

[AP]
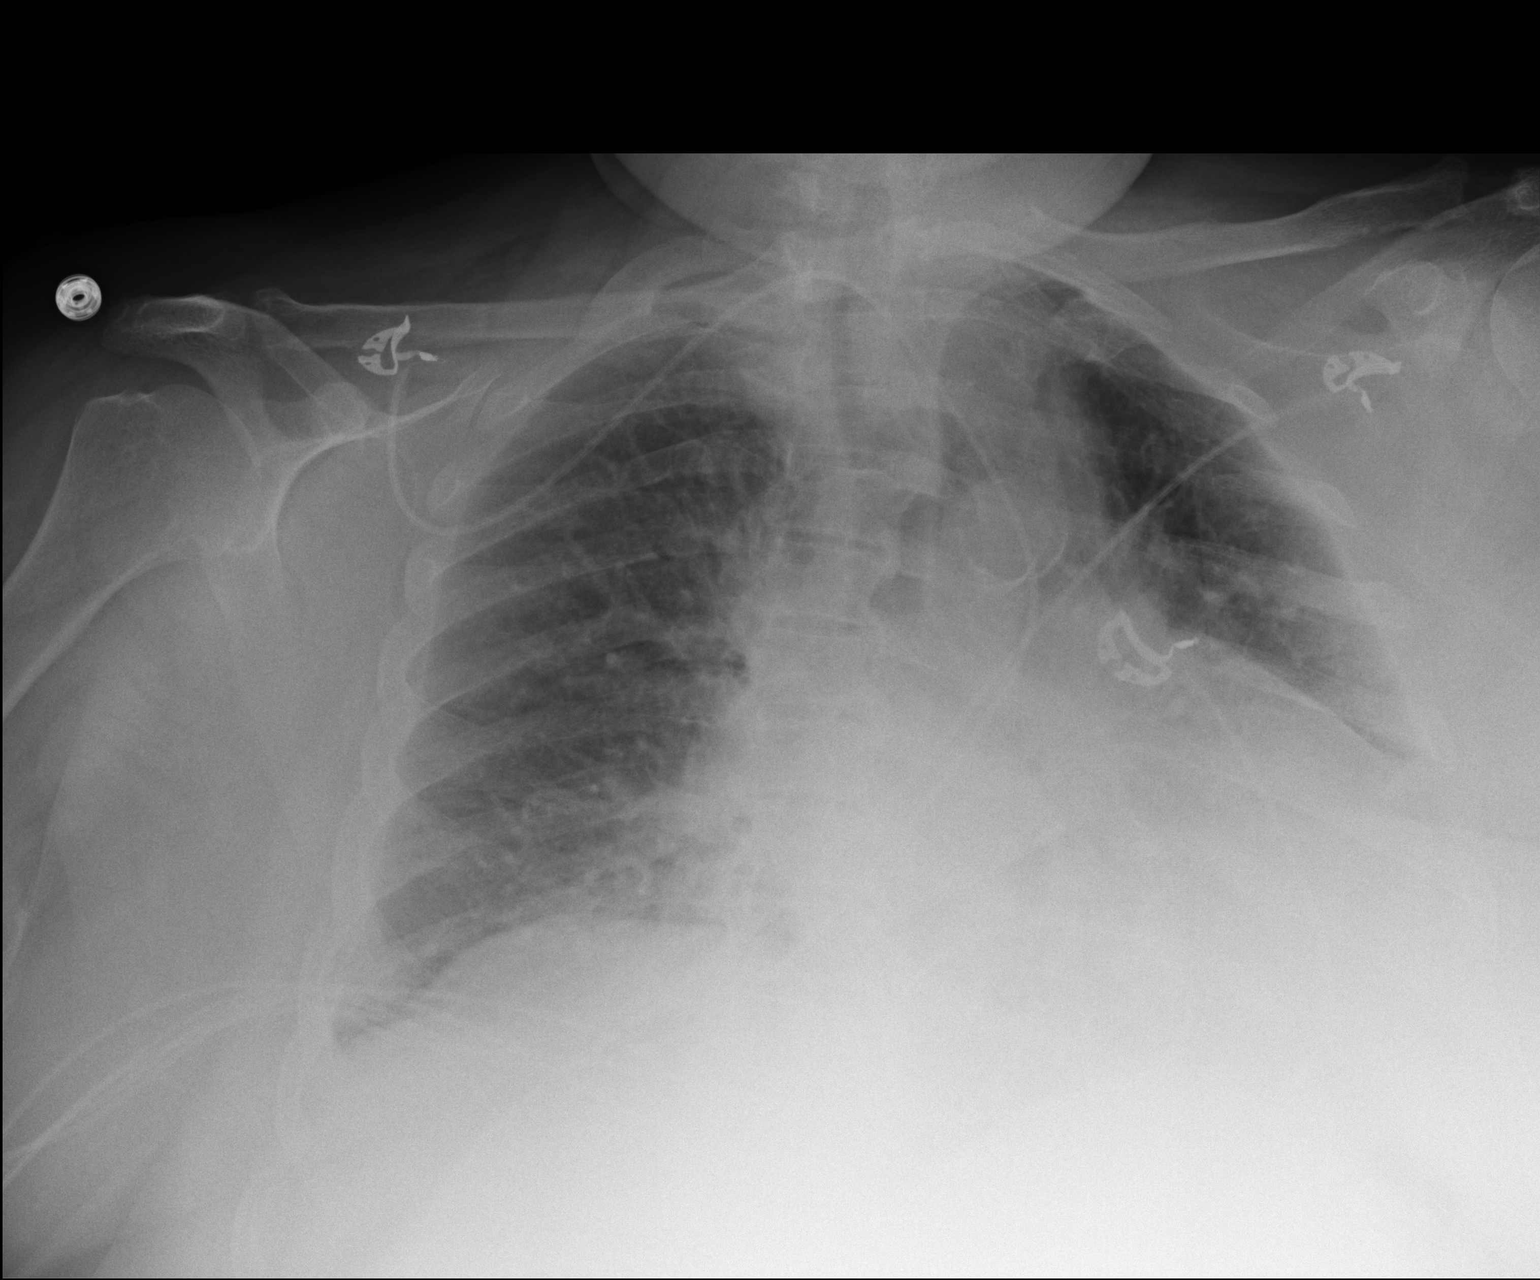

[1 of 1 positions shown; findings below may reference images not displayed]

FINDINGS: Patient rotated left. Moderate cardiomegaly. Mildly degraded exam
due to AP portable technique and patient body habitus. Apical
lordotic positioning. Cannot exclude small left pleural effusion. No
pneumothorax. Mild pulmonary venous congestion, accentuated by low
lung volumes. Left lung base not well evaluated due to overlying
soft tissues. Mild right base atelectasis.
IMPRESSION: Decreased sensitivity and specificity exam due to technique related
factors, as described above.

Decreased aeration with diminished lung volumes. Probable bibasilar
airspace disease. Favor atelectasis on the right.

Cardiomegaly with development of mild pulmonary venous congestion.

Possible small left pleural effusion.

## 2017-06-13 DIAGNOSIS — N186 End stage renal disease: Secondary | ICD-10-CM | POA: Diagnosis not present

## 2017-06-13 DIAGNOSIS — N2581 Secondary hyperparathyroidism of renal origin: Secondary | ICD-10-CM | POA: Diagnosis not present

## 2017-06-16 DIAGNOSIS — N186 End stage renal disease: Secondary | ICD-10-CM | POA: Diagnosis not present

## 2017-06-16 DIAGNOSIS — N2581 Secondary hyperparathyroidism of renal origin: Secondary | ICD-10-CM | POA: Diagnosis not present

## 2017-06-18 DIAGNOSIS — N2581 Secondary hyperparathyroidism of renal origin: Secondary | ICD-10-CM | POA: Diagnosis not present

## 2017-06-18 DIAGNOSIS — N186 End stage renal disease: Secondary | ICD-10-CM | POA: Diagnosis not present

## 2017-06-19 DIAGNOSIS — E1129 Type 2 diabetes mellitus with other diabetic kidney complication: Secondary | ICD-10-CM | POA: Diagnosis not present

## 2017-06-19 DIAGNOSIS — Z992 Dependence on renal dialysis: Secondary | ICD-10-CM | POA: Diagnosis not present

## 2017-06-19 DIAGNOSIS — N186 End stage renal disease: Secondary | ICD-10-CM | POA: Diagnosis not present

## 2017-06-20 DIAGNOSIS — N2581 Secondary hyperparathyroidism of renal origin: Secondary | ICD-10-CM | POA: Diagnosis not present

## 2017-06-20 DIAGNOSIS — N186 End stage renal disease: Secondary | ICD-10-CM | POA: Diagnosis not present

## 2017-06-21 DIAGNOSIS — I270 Primary pulmonary hypertension: Secondary | ICD-10-CM | POA: Diagnosis not present

## 2017-06-21 DIAGNOSIS — R0602 Shortness of breath: Secondary | ICD-10-CM | POA: Diagnosis not present

## 2017-06-21 DIAGNOSIS — G4733 Obstructive sleep apnea (adult) (pediatric): Secondary | ICD-10-CM | POA: Diagnosis not present

## 2017-06-21 DIAGNOSIS — I5043 Acute on chronic combined systolic (congestive) and diastolic (congestive) heart failure: Secondary | ICD-10-CM | POA: Diagnosis not present

## 2017-06-21 DIAGNOSIS — J449 Chronic obstructive pulmonary disease, unspecified: Secondary | ICD-10-CM | POA: Diagnosis not present

## 2017-06-21 DIAGNOSIS — R0901 Asphyxia: Secondary | ICD-10-CM | POA: Diagnosis not present

## 2017-06-23 DIAGNOSIS — N186 End stage renal disease: Secondary | ICD-10-CM | POA: Diagnosis not present

## 2017-06-23 DIAGNOSIS — N2581 Secondary hyperparathyroidism of renal origin: Secondary | ICD-10-CM | POA: Diagnosis not present

## 2017-06-25 DIAGNOSIS — N2581 Secondary hyperparathyroidism of renal origin: Secondary | ICD-10-CM | POA: Diagnosis not present

## 2017-06-25 DIAGNOSIS — N186 End stage renal disease: Secondary | ICD-10-CM | POA: Diagnosis not present

## 2017-06-27 DIAGNOSIS — N2581 Secondary hyperparathyroidism of renal origin: Secondary | ICD-10-CM | POA: Diagnosis not present

## 2017-06-27 DIAGNOSIS — N186 End stage renal disease: Secondary | ICD-10-CM | POA: Diagnosis not present

## 2017-06-30 DIAGNOSIS — N186 End stage renal disease: Secondary | ICD-10-CM | POA: Diagnosis not present

## 2017-06-30 DIAGNOSIS — N2581 Secondary hyperparathyroidism of renal origin: Secondary | ICD-10-CM | POA: Diagnosis not present

## 2017-06-30 NOTE — Progress Notes (Signed)
[facility] Note  07/01/2017     CHIEF COMPLAINT Patient presents for Retina Follow Up (Vit. Hem. os) and Post-op Follow-up   HISTORY OF PRESENT ILLNESS: [name] is a 72 y.o. female who presents to the clinic today for:   HPI    Retina Follow Up    Patient presents with  Other.  This started 1 month ago.  Severity is mild.  Since onset it is stable.  I, the attending physician,  performed the HPI with the patient and updated documentation appropriately. Additional comments: Vit. Hem. os          Post-op Follow-up    In left eye.  Discomfort includes floaters.  Negative for pain, itching, foreign body sensation, tearing and discharge.  Vision is stable.  I, the attending physician,  performed the HPI with the patient and updated documentation appropriately.          Comments    POV4 S/P 25g PPV w/20g MRV port w/ endolaser OS(05/07/17). Patient states her eye is "feeling much better", ocasional  floaters os. No new onsets. BS 100 this am ,Pt reports bs are WINL .Pt has prosthetic eye OD.Pt is compliant with eye gtt's is instructed.       Last edited by Bernarda Caffey, MD on 07/02/2017 10:44 PM. (History)       Referring physician: [name], MD [place], [place] [address]  HISTORICAL INFORMATION:   Selected notes from the MEDICAL RECORD NUMBER Referred by Dr. Delane Ginger. Hensel for possible RT OS;  LEE-  Ocular Hx-  PMH-     CURRENT MEDICATIONS: Current Outpatient Medications (Ophthalmic Drugs)  Medication Sig  . bacitracin-polymyxin b (POLYSPORIN) ophthalmic ointment Place into the left eye 4 (four) times daily. Place a 1/2 inch ribbon of ointment into the lower eyelid.  . prednisoLONE acetate (PRED FORTE) 1 % ophthalmic suspension Place 1 drop into the left eye 4 (four) times daily.   No current facility-administered medications for this visit.  (Ophthalmic Drugs)   Current Outpatient Medications (Other)  Medication Sig   . albuterol (PROVENTIL) (2.5 MG/3ML) 0.083% nebulizer solution Take 3 mLs (2.5 mg total) by nebulization every 2 (two) hours as needed for shortness of breath.  Marland Kitchen aspirin EC 81 MG tablet Take 81 mg by mouth daily.    Marland Kitchen atorvastatin (LIPITOR) 40 MG tablet Take 40 mg by mouth daily.   . carvedilol (COREG) 25 MG tablet Take 0.5 tablets (12.5 mg total) by mouth 2 (two) times daily. (Patient taking differently: Take 25 mg by mouth 2 (two) times daily. )  . cinacalcet (SENSIPAR) 90 MG tablet Take 1 tablet (90 mg) by mouth daily. On dialysis days, take the dose after dialysis  . diphenhydrAMINE (BENADRYL) 25 mg capsule Take 50 mg by mouth See admin instructions. Take 2 tablets (50 mg) by mouth prior to dialysis on Tuesday, Thursday, Saturday - for itching  . ethyl chloride spray Apply 1 application topically See admin instructions. Apply topically before dialysis on Tuesday, Thursday, Saturday  . guaiFENesin-dextromethorphan (ROBITUSSIN DM) 100-10 MG/5ML syrup Take 5 mLs by mouth every 4 (four) hours while awake.  Marland Kitchen HYDROcodone-acetaminophen (NORCO) 5-325 MG tablet Take 1 tablet by mouth every 6 (six) hours as needed for moderate pain.  Marland Kitchen insulin glargine (LANTUS) 100 UNIT/ML injection Inject 0.05 mLs (5 Units total) into the skin at bedtime. (Patient taking differently: Inject 6 Units into the skin at bedtime. )  . midodrine (PROAMATINE) 10  MG tablet Take 10 mg by mouth See admin instructions. Take 10mg  on Tues, Thurs, and Saturday before dialysis  . sevelamer carbonate (RENVELA) 800 MG tablet Take 2-3 tablets (1,600-2,400 mg total) by mouth See admin instructions. Take 3 tablets (2,400 mg) by mouth 3 times daily with meals. Take 2 tablets (1,600 mg) by mouth with snacks. Receives supply from dialysis center.  . tiotropium (SPIRIVA) 18 MCG inhalation capsule Place 1 capsule (18 mcg total) into inhaler and inhale daily. (Patient taking differently: Place 18 mcg into inhaler and inhale daily as needed  (shortness of breath). )   No current facility-administered medications for this visit.  (Other)      REVIEW OF SYSTEMS: ROS    Positive for: Musculoskeletal, Eyes   Negative for: Constitutional, Gastrointestinal, Neurological, Skin, Genitourinary, HENT, Endocrine, Cardiovascular, Respiratory, Psychiatric, Allergic/Imm, Heme/Lymph   Last edited by Zenovia Jordan, LPN on 8/75/6433  2:95 AM. (History)       ALLERGIES Allergies  Allergen Reactions  . Lyrica [Pregabalin] Other (See Comments)    Hallucinations    PAST MEDICAL HISTORY Past Medical History:  Diagnosis Date  . Abdominal abscess 12-17-10   abdominal abscesses x2 ? one at this time  . Anemia   . Arthritis    "all over" (08/23/2012)  . Blind right eye    fell and crushed socket and eye fell out, was replaced at [facility]  . CHF (congestive heart failure) ([place])   . Chronic lower back pain   . Coronary artery disease    normal coronaries by 08/25/12 cath  . Dyspnea   . Dysrhythmia    irregular, "skips beats"  . ESRD (end stage renal disease) on dialysis ([place])    "started 12/2011; [place]; TTS" (08/23/2012  . Exertional shortness of breath   . Eye drainage    "lots; since fall 07/2011" (08/23/2012)  . Gout   . H/O hiatal hernia   . Headache(784.0)    "weekly sometimes; since I fell and hit my head in 07/2011 08/23/2012)  . Heart murmur   . History of blood transfusion    "lots before starting dialysis" (08/23/2012)  . Hypertension    sees Dr. Willey Blade  . Inhalation injury    "worked at [employer]; can't inhale polyurethane or paint, etc" (08/23/2012)  . Myocardial infarction ([place]) 1970's or 1980's  . Neuromuscular disorder ([place])    diabetic neuropathy  . OSA on CPAP   . Staph aureus infection November 2012   . Swelling of both ankles 12-17-10  . Type II diabetes mellitus ([place])    "over 10 years now" (08/23/2012)   Past Surgical History:  Procedure Laterality Date  . [place] VITRECTOMY WITH  20 GAUGE MVR PORT Left 05/07/2017   Procedure: 25 GAUGE PARS PLANA VITRECTOMY WITH ENDOLASER LEFT EYE;  Surgeon: Bernarda Caffey, MD;  Location: [facility];  Service: Ophthalmology;  Laterality: Left;  . APPENDECTOMY    . AV FISTULA PLACEMENT  09/30/2011   Procedure: ARTERIOVENOUS (AV) FISTULA CREATION;  Surgeon: Conrad [place], MD;  Location: [facility];  Service: Vascular;  Laterality: Left;  BRACHIAL-CEPHALIC  . BACK SURGERY    . BASCILIC VEIN TRANSPOSITION Left 11/10/2011   Left arm   . [place] TRANSPOSITION  01/28/2012   Procedure: BASCILIC VEIN TRANSPOSITION;  Surgeon: Conrad [place], MD;  Location: [facility];  Service: Vascular;  Laterality: Left;  Second Stage   . CARDIAC CATHETERIZATION  1980's  . CATARACT EXTRACTION W/ INTRAOCULAR LENS  IMPLANT Bilateral 1990's  . CORONARY ANGIOGRAPHY N/A 04/07/2017   Procedure: CORONARY ANGIOGRAPHY (CATH LAB);  Surgeon: Dixie Dials, MD;  Location: [facility] CV LAB;  Service: Cardiovascular;  Laterality: N/A;  . DILATION AND CURETTAGE OF UTERUS  1960's  . EYE SURGERY    . FISTULA SUPERFICIALIZATION Left 02/18/2017   Procedure: SUPERFICIALIZATION LEFT ARM ARTERIOVENOUS FISTULA;  Surgeon: Serafina Mitchell, MD;  Location: [facility];  Service: Vascular;  Laterality: Left;  . HERNIA REPAIR    . INSERTION OF DIALYSIS CATHETER  01/05/2012   Procedure: INSERTION OF DIALYSIS CATHETER;  Surgeon: Conrad Braselton, MD;  Location: [facility];  Service: Vascular;  Laterality: N/A;  Right Internal Jugular Placement  . INTRAOCULAR PROSTHESES INSERTION Right 2013   "fell; knocked my eye outl" (08/23/2012)  . JOINT REPLACEMENT    . LAPAROTOMY  12/18/2010   Procedure: EXPLORATORY LAPAROTOMY;  Surgeon: Rolm Bookbinder, MD;  Location: [facility];  Service: General;  Laterality: N/A;  Abdominal Seroma Evacuation  . LEFT HEART CATHETERIZATION WITH CORONARY ANGIOGRAM N/A 08/25/2012   Procedure: LEFT HEART CATHETERIZATION WITH CORONARY ANGIOGRAM;  Surgeon: Birdie Riddle, MD;  Location: [facility] CATH LAB;   Service: Cardiovascular;  Laterality: N/A;  . LUMBAR DISC SURGERY  1970'-80's   X 3  . TOTAL KNEE ARTHROPLASTY Left 1980's  . VAGINAL HYSTERECTOMY  1970's  . VENTRAL HERNIA REPAIR  2012-2013   component separation, repair with biologic; "had 3 surgeries to fix it" (08/23/2012)  . WOUND DEBRIDEMENT  03/20/2011   Procedure: DEBRIDEMENT ABDOMINAL WOUND;  Surgeon: Rolm Bookbinder, MD;  [facility];  Service: General;  Laterality: N/A;  debridement abdominal wall, placement of wound vac    FAMILY HISTORY Family History  Problem Relation Age of Onset  . Hypertension Mother   . Cancer Brother        spine  . Hypertension Father   . Cancer Sister        brain  . Hypertension Maternal Grandmother   . Anesthesia problems Neg Hx     SOCIAL HISTORY Social History   Tobacco Use  . Smoking status: Former Smoker    Packs/day: 0.25    Years: 16.00    Pack years: 4.00    Types: Cigarettes    Last attempt to quit: 12/16/1980    Years since quitting: 36.5  . Smokeless tobacco: Never Used  Substance Use Topics  . Alcohol use: No  . Drug use: No         OPHTHALMIC EXAM:  Base Eye Exam    Visual Acuity (Snellen - Linear)      Right Left   Dist [place] prosthetic 20/40   Dist ph   NI       Tonometry (Tonopen, 8:14 AM)      Right Left   Pressure  16       Pupils      Dark Light Shape React APD   Right pros       Left 4 2 Round Slow None       Neuro/Psych    Oriented x3:  Yes   Mood/Affect:  Normal       Dilation    Left eye:  1.0% Mydriacyl, 2.5% Phenylephrine @ 8:14 AM        Slit Lamp and Fundus Exam    Slit Lamp Exam      Right Left   Lids/Lashes  Lid retraction   Conjunctiva/Sclera Prosthetic in good postion Melanosis, sutures desolving -- mild injection  around dissolving sutures   Cornea  Arcus, edema resolved   Anterior Chamber  Deep and quiet   Iris  Round and dilated, No NVI   Lens  Three piece PC IOL in good position, open PC   Vitreous  Post  vitrectomy; Vitreous hemorrhage cleared       Fundus Exam      Right Left   Disc  Pink and Sharp, central Pallor   C/D Ratio  0.65   Macula  Flat, Good foveal reflex, Retinal pigment epithelial mottling   Vessels  peripheral sclerosis -- slightly improved, Copper wiring, Vascular attenuation, AV crossing changes   Periphery  Attached; good 360 peripheral laser          IMAGING AND PROCEDURES  Imaging and Procedures for 05/15/17  OCT, Retina - OU - Both Eyes       Right Eye Quality was (Prosthetic).   Left Eye Quality was borderline. Central Foveal Thickness: 188. Progression has improved. Findings include normal foveal contour, no IRF, no SRF (VH resolved).   Notes *Images captured and stored on drive  Diagnosis / Impression:  OD: Prosthetic OS: NFP, No IRF/SRF -- VH resolved  Clinical management:  See below  Abbreviations: NFP - Normal foveal profile. CME - cystoid macular edema. PED - pigment epithelial detachment. IRF - intraretinal fluid. SRF - subretinal fluid. EZ - ellipsoid zone. ERM - epiretinal membrane. ORA - outer retinal atrophy. ORT - outer retinal tubulation. SRHM - subretinal hyper-reflective material                  ASSESSMENT/PLAN:    ICD-10-CM   1. Vitreous hemorrhage of left eye (HCC) H43.12 OCT, Retina - OU - Both Eyes  2. Pseudophakia of left eye Z96.1   3. 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 Vitreous hemorrhage OS -  - POW8; S/P 25g PPV/EL/Lucentis injection OS (04.18.19)             - doing well-- VH remains cleared, VA 20/30             - IOP good   - ran out of PF on Saturday and has had some mild soreness OS since running out             - begin   PF taper -- 3,2,1 drops daily, decrease weekly   - cont  PSO ung prn OS             - keep head elevated  - F/U 4-6 weeks  2. Pseudophakia OS-  - s/p CE/IOL  - doing well  - monitor  3. Prosthetic OD-  - prosthetic in good position - traumatic fall w/ open globe repair  2013 (Dr. Lennon Alstrom) --> enucleated by Dr. Toy Cookey - had prosthetic cleaned recently in [facility] - stable  Ophthalmic Meds Ordered this visit:  No orders of the defined types were placed in this encounter.      Return in about 6 weeks (around 08/12/2017) for POV; VH OS.  There are no Patient Instructions on file for this visit.   Explained the diagnoses, plan, and follow up with the patient and they expressed understanding.  Patient expressed understanding of the importance of proper follow up care.   This document serves as a record of services personally performed by Gardiner Sleeper, MD, PhD. It was created on their behalf by Catha Brow, [place], a certified ophthalmic assistant. The creation of this record is the provider's dictation and/or  activities during the visit.  Electronically signed by: Catha Brow, COA  06.11.19 10:45 PM   Gardiner Sleeper, M.D., Ph.D. Diseases & Surgery of the Retina and Vitreous [facility]  I have reviewed the above documentation for accuracy and completeness, and I agree with the above. Gardiner Sleeper, M.D., Ph.D. 07/02/17 10:53 PM    Abbreviations: M myopia (nearsighted); A astigmatism; H hyperopia (farsighted); P presbyopia; Mrx spectacle prescription;  CTL contact lenses; OD right eye; OS left eye; OU both eyes  XT exotropia; ET esotropia; PEK punctate epithelial keratitis; PEE punctate epithelial erosions; DES dry eye syndrome; MGD meibomian gland dysfunction; ATs artificial tears; PFAT's preservative free artificial tears; [place] nuclear sclerotic cataract; PSC posterior subcapsular cataract; ERM epi-retinal membrane; PVD posterior vitreous detachment; RD retinal detachment; DM diabetes mellitus; DR diabetic retinopathy; NPDR non-proliferative diabetic retinopathy; PDR proliferative diabetic retinopathy; CSME clinically significant macular edema; DME diabetic macular edema; dbh dot blot hemorrhages; CWS cotton wool spot; POAG  primary open angle glaucoma; C/D cup-to-disc ratio; HVF humphrey visual field; GVF goldmann visual field; OCT optical coherence tomography; IOP intraocular pressure; BRVO Branch retinal vein occlusion; CRVO central retinal vein occlusion; CRAO central retinal artery occlusion; BRAO branch retinal artery occlusion; RT retinal tear; SB scleral buckle; PPV pars plana vitrectomy; VH Vitreous hemorrhage; PRP panretinal laser photocoagulation; IVK intravitreal kenalog; VMT vitreomacular traction; MH Macular hole;  NVD neovascularization of the disc; NVE neovascularization elsewhere; AREDS age related eye disease study; ARMD age related macular degeneration; POAG primary open angle glaucoma; EBMD epithelial/anterior basement membrane dystrophy; ACIOL anterior chamber intraocular lens; IOL intraocular lens; PCIOL posterior chamber intraocular lens; Phaco/IOL phacoemulsification with intraocular lens placement; [place] photorefractive keratectomy; LASIK laser assisted in situ keratomileusis; HTN hypertension; DM diabetes mellitus; COPD chronic obstructive pulmonary disease

## 2017-07-01 ENCOUNTER — Ambulatory Visit (INDEPENDENT_AMBULATORY_CARE_PROVIDER_SITE_OTHER): Payer: Medicare HMO | Admitting: Ophthalmology

## 2017-07-01 ENCOUNTER — Encounter (INDEPENDENT_AMBULATORY_CARE_PROVIDER_SITE_OTHER): Payer: Self-pay | Admitting: Ophthalmology

## 2017-07-01 DIAGNOSIS — H4312 Vitreous hemorrhage, left eye: Secondary | ICD-10-CM

## 2017-07-01 DIAGNOSIS — H3581 Retinal edema: Secondary | ICD-10-CM

## 2017-07-01 DIAGNOSIS — Z970 Presence of artificial eye: Secondary | ICD-10-CM

## 2017-07-01 DIAGNOSIS — Z961 Presence of intraocular lens: Secondary | ICD-10-CM

## 2017-07-02 ENCOUNTER — Encounter (INDEPENDENT_AMBULATORY_CARE_PROVIDER_SITE_OTHER): Payer: Self-pay | Admitting: Ophthalmology

## 2017-07-02 DIAGNOSIS — N2581 Secondary hyperparathyroidism of renal origin: Secondary | ICD-10-CM | POA: Diagnosis not present

## 2017-07-02 DIAGNOSIS — N186 End stage renal disease: Secondary | ICD-10-CM | POA: Diagnosis not present

## 2017-07-04 DIAGNOSIS — N2581 Secondary hyperparathyroidism of renal origin: Secondary | ICD-10-CM | POA: Diagnosis not present

## 2017-07-04 DIAGNOSIS — N186 End stage renal disease: Secondary | ICD-10-CM | POA: Diagnosis not present

## 2017-07-07 DIAGNOSIS — N2581 Secondary hyperparathyroidism of renal origin: Secondary | ICD-10-CM | POA: Diagnosis not present

## 2017-07-07 DIAGNOSIS — N186 End stage renal disease: Secondary | ICD-10-CM | POA: Diagnosis not present

## 2017-07-09 DIAGNOSIS — N186 End stage renal disease: Secondary | ICD-10-CM | POA: Diagnosis not present

## 2017-07-09 DIAGNOSIS — N2581 Secondary hyperparathyroidism of renal origin: Secondary | ICD-10-CM | POA: Diagnosis not present

## 2017-07-11 DIAGNOSIS — N2581 Secondary hyperparathyroidism of renal origin: Secondary | ICD-10-CM | POA: Diagnosis not present

## 2017-07-11 DIAGNOSIS — N186 End stage renal disease: Secondary | ICD-10-CM | POA: Diagnosis not present

## 2017-07-14 DIAGNOSIS — N2581 Secondary hyperparathyroidism of renal origin: Secondary | ICD-10-CM | POA: Diagnosis not present

## 2017-07-14 DIAGNOSIS — N186 End stage renal disease: Secondary | ICD-10-CM | POA: Diagnosis not present

## 2017-07-16 DIAGNOSIS — N2581 Secondary hyperparathyroidism of renal origin: Secondary | ICD-10-CM | POA: Diagnosis not present

## 2017-07-16 DIAGNOSIS — N186 End stage renal disease: Secondary | ICD-10-CM | POA: Diagnosis not present

## 2017-07-18 DIAGNOSIS — N186 End stage renal disease: Secondary | ICD-10-CM | POA: Diagnosis not present

## 2017-07-18 DIAGNOSIS — N2581 Secondary hyperparathyroidism of renal origin: Secondary | ICD-10-CM | POA: Diagnosis not present

## 2017-07-19 DIAGNOSIS — N186 End stage renal disease: Secondary | ICD-10-CM | POA: Diagnosis not present

## 2017-07-19 DIAGNOSIS — Z992 Dependence on renal dialysis: Secondary | ICD-10-CM | POA: Diagnosis not present

## 2017-07-19 DIAGNOSIS — E1129 Type 2 diabetes mellitus with other diabetic kidney complication: Secondary | ICD-10-CM | POA: Diagnosis not present

## 2017-07-21 DIAGNOSIS — R0602 Shortness of breath: Secondary | ICD-10-CM | POA: Diagnosis not present

## 2017-07-21 DIAGNOSIS — I5043 Acute on chronic combined systolic (congestive) and diastolic (congestive) heart failure: Secondary | ICD-10-CM | POA: Diagnosis not present

## 2017-07-21 DIAGNOSIS — N2581 Secondary hyperparathyroidism of renal origin: Secondary | ICD-10-CM | POA: Diagnosis not present

## 2017-07-21 DIAGNOSIS — G4733 Obstructive sleep apnea (adult) (pediatric): Secondary | ICD-10-CM | POA: Diagnosis not present

## 2017-07-21 DIAGNOSIS — R0901 Asphyxia: Secondary | ICD-10-CM | POA: Diagnosis not present

## 2017-07-21 DIAGNOSIS — J449 Chronic obstructive pulmonary disease, unspecified: Secondary | ICD-10-CM | POA: Diagnosis not present

## 2017-07-21 DIAGNOSIS — I270 Primary pulmonary hypertension: Secondary | ICD-10-CM | POA: Diagnosis not present

## 2017-07-21 DIAGNOSIS — N186 End stage renal disease: Secondary | ICD-10-CM | POA: Diagnosis not present

## 2017-07-23 DIAGNOSIS — N2581 Secondary hyperparathyroidism of renal origin: Secondary | ICD-10-CM | POA: Diagnosis not present

## 2017-07-23 DIAGNOSIS — N186 End stage renal disease: Secondary | ICD-10-CM | POA: Diagnosis not present

## 2017-07-25 DIAGNOSIS — N186 End stage renal disease: Secondary | ICD-10-CM | POA: Diagnosis not present

## 2017-07-25 DIAGNOSIS — N2581 Secondary hyperparathyroidism of renal origin: Secondary | ICD-10-CM | POA: Diagnosis not present

## 2017-07-28 DIAGNOSIS — N2581 Secondary hyperparathyroidism of renal origin: Secondary | ICD-10-CM | POA: Diagnosis not present

## 2017-07-28 DIAGNOSIS — N186 End stage renal disease: Secondary | ICD-10-CM | POA: Diagnosis not present

## 2017-07-30 DIAGNOSIS — N186 End stage renal disease: Secondary | ICD-10-CM | POA: Diagnosis not present

## 2017-07-30 DIAGNOSIS — N2581 Secondary hyperparathyroidism of renal origin: Secondary | ICD-10-CM | POA: Diagnosis not present

## 2017-07-31 ENCOUNTER — Other Ambulatory Visit: Payer: Self-pay | Admitting: Gastroenterology

## 2017-07-31 DIAGNOSIS — D509 Iron deficiency anemia, unspecified: Secondary | ICD-10-CM | POA: Diagnosis not present

## 2017-07-31 DIAGNOSIS — R195 Other fecal abnormalities: Secondary | ICD-10-CM | POA: Diagnosis not present

## 2017-07-31 DIAGNOSIS — R1084 Generalized abdominal pain: Secondary | ICD-10-CM | POA: Diagnosis not present

## 2017-08-01 DIAGNOSIS — N2581 Secondary hyperparathyroidism of renal origin: Secondary | ICD-10-CM | POA: Diagnosis not present

## 2017-08-01 DIAGNOSIS — N186 End stage renal disease: Secondary | ICD-10-CM | POA: Diagnosis not present

## 2017-08-04 DIAGNOSIS — N186 End stage renal disease: Secondary | ICD-10-CM | POA: Diagnosis not present

## 2017-08-04 DIAGNOSIS — N2581 Secondary hyperparathyroidism of renal origin: Secondary | ICD-10-CM | POA: Diagnosis not present

## 2017-08-05 ENCOUNTER — Encounter (INDEPENDENT_AMBULATORY_CARE_PROVIDER_SITE_OTHER): Payer: Medicare HMO | Admitting: Ophthalmology

## 2017-08-06 DIAGNOSIS — N186 End stage renal disease: Secondary | ICD-10-CM | POA: Diagnosis not present

## 2017-08-06 DIAGNOSIS — N2581 Secondary hyperparathyroidism of renal origin: Secondary | ICD-10-CM | POA: Diagnosis not present

## 2017-08-08 DIAGNOSIS — N186 End stage renal disease: Secondary | ICD-10-CM | POA: Diagnosis not present

## 2017-08-08 DIAGNOSIS — N2581 Secondary hyperparathyroidism of renal origin: Secondary | ICD-10-CM | POA: Diagnosis not present

## 2017-08-11 DIAGNOSIS — N186 End stage renal disease: Secondary | ICD-10-CM | POA: Diagnosis not present

## 2017-08-11 DIAGNOSIS — N2581 Secondary hyperparathyroidism of renal origin: Secondary | ICD-10-CM | POA: Diagnosis not present

## 2017-08-13 DIAGNOSIS — N186 End stage renal disease: Secondary | ICD-10-CM | POA: Diagnosis not present

## 2017-08-13 DIAGNOSIS — N2581 Secondary hyperparathyroidism of renal origin: Secondary | ICD-10-CM | POA: Diagnosis not present

## 2017-08-15 DIAGNOSIS — N2581 Secondary hyperparathyroidism of renal origin: Secondary | ICD-10-CM | POA: Diagnosis not present

## 2017-08-15 DIAGNOSIS — N186 End stage renal disease: Secondary | ICD-10-CM | POA: Diagnosis not present

## 2017-08-17 ENCOUNTER — Other Ambulatory Visit: Payer: Self-pay | Admitting: Gastroenterology

## 2017-08-18 DIAGNOSIS — N186 End stage renal disease: Secondary | ICD-10-CM | POA: Diagnosis not present

## 2017-08-18 DIAGNOSIS — N2581 Secondary hyperparathyroidism of renal origin: Secondary | ICD-10-CM | POA: Diagnosis not present

## 2017-08-19 DIAGNOSIS — N186 End stage renal disease: Secondary | ICD-10-CM | POA: Diagnosis not present

## 2017-08-19 DIAGNOSIS — Z992 Dependence on renal dialysis: Secondary | ICD-10-CM | POA: Diagnosis not present

## 2017-08-19 DIAGNOSIS — E1129 Type 2 diabetes mellitus with other diabetic kidney complication: Secondary | ICD-10-CM | POA: Diagnosis not present

## 2017-08-19 NOTE — Progress Notes (Signed)
Spoke with patient who is aware to be here at 0900 day of her procedure. All instructions reviewed and pt verbalized understanding. Jobe Igo, RN

## 2017-08-20 DIAGNOSIS — N2581 Secondary hyperparathyroidism of renal origin: Secondary | ICD-10-CM | POA: Diagnosis not present

## 2017-08-20 DIAGNOSIS — N186 End stage renal disease: Secondary | ICD-10-CM | POA: Diagnosis not present

## 2017-08-21 DIAGNOSIS — M109 Gout, unspecified: Secondary | ICD-10-CM | POA: Diagnosis not present

## 2017-08-21 DIAGNOSIS — I270 Primary pulmonary hypertension: Secondary | ICD-10-CM | POA: Diagnosis not present

## 2017-08-21 DIAGNOSIS — R0602 Shortness of breath: Secondary | ICD-10-CM | POA: Diagnosis not present

## 2017-08-21 DIAGNOSIS — G4733 Obstructive sleep apnea (adult) (pediatric): Secondary | ICD-10-CM | POA: Diagnosis not present

## 2017-08-21 DIAGNOSIS — Z6841 Body Mass Index (BMI) 40.0 and over, adult: Secondary | ICD-10-CM | POA: Diagnosis not present

## 2017-08-21 DIAGNOSIS — R0901 Asphyxia: Secondary | ICD-10-CM | POA: Diagnosis not present

## 2017-08-21 DIAGNOSIS — I5043 Acute on chronic combined systolic (congestive) and diastolic (congestive) heart failure: Secondary | ICD-10-CM | POA: Diagnosis not present

## 2017-08-21 DIAGNOSIS — J449 Chronic obstructive pulmonary disease, unspecified: Secondary | ICD-10-CM | POA: Diagnosis not present

## 2017-08-22 DIAGNOSIS — N2581 Secondary hyperparathyroidism of renal origin: Secondary | ICD-10-CM | POA: Diagnosis not present

## 2017-08-22 DIAGNOSIS — N186 End stage renal disease: Secondary | ICD-10-CM | POA: Diagnosis not present

## 2017-08-24 ENCOUNTER — Other Ambulatory Visit: Payer: Self-pay

## 2017-08-24 ENCOUNTER — Ambulatory Visit (HOSPITAL_COMMUNITY)
Admission: RE | Admit: 2017-08-24 | Discharge: 2017-08-24 | Disposition: A | Discharge status: Home / Self Care | Payer: Medicare HMO | Source: Ambulatory Visit | Attending: Gastroenterology | Admitting: Gastroenterology

## 2017-08-24 ENCOUNTER — Encounter (HOSPITAL_COMMUNITY)
Admission: RE | Disposition: A | Discharge status: Home / Self Care | Payer: Self-pay | Source: Ambulatory Visit | Attending: Gastroenterology

## 2017-08-24 ENCOUNTER — Ambulatory Visit (HOSPITAL_COMMUNITY): Payer: Medicare HMO | Admitting: Anesthesiology

## 2017-08-24 ENCOUNTER — Encounter (HOSPITAL_COMMUNITY): Payer: Self-pay | Admitting: Certified Registered"

## 2017-08-24 DIAGNOSIS — K2970 Gastritis, unspecified, without bleeding: Secondary | ICD-10-CM | POA: Diagnosis not present

## 2017-08-24 DIAGNOSIS — I132 Hypertensive heart and chronic kidney disease with heart failure and with stage 5 chronic kidney disease, or end stage renal disease: Secondary | ICD-10-CM | POA: Diagnosis not present

## 2017-08-24 DIAGNOSIS — K449 Diaphragmatic hernia without obstruction or gangrene: Secondary | ICD-10-CM | POA: Insufficient documentation

## 2017-08-24 DIAGNOSIS — D519 Vitamin B12 deficiency anemia, unspecified: Secondary | ICD-10-CM | POA: Diagnosis not present

## 2017-08-24 DIAGNOSIS — E1122 Type 2 diabetes mellitus with diabetic chronic kidney disease: Secondary | ICD-10-CM | POA: Diagnosis not present

## 2017-08-24 DIAGNOSIS — N186 End stage renal disease: Secondary | ICD-10-CM | POA: Diagnosis not present

## 2017-08-24 DIAGNOSIS — K2980 Duodenitis without bleeding: Secondary | ICD-10-CM | POA: Insufficient documentation

## 2017-08-24 DIAGNOSIS — D509 Iron deficiency anemia, unspecified: Secondary | ICD-10-CM | POA: Insufficient documentation

## 2017-08-24 DIAGNOSIS — Z794 Long term (current) use of insulin: Secondary | ICD-10-CM | POA: Insufficient documentation

## 2017-08-24 DIAGNOSIS — Z6841 Body Mass Index (BMI) 40.0 and over, adult: Secondary | ICD-10-CM | POA: Diagnosis not present

## 2017-08-24 DIAGNOSIS — R195 Other fecal abnormalities: Secondary | ICD-10-CM | POA: Diagnosis present

## 2017-08-24 DIAGNOSIS — Z992 Dependence on renal dialysis: Secondary | ICD-10-CM | POA: Diagnosis not present

## 2017-08-24 DIAGNOSIS — K640 First degree hemorrhoids: Secondary | ICD-10-CM | POA: Insufficient documentation

## 2017-08-24 DIAGNOSIS — Z87891 Personal history of nicotine dependence: Secondary | ICD-10-CM | POA: Diagnosis not present

## 2017-08-24 DIAGNOSIS — K2900 Acute gastritis without bleeding: Secondary | ICD-10-CM | POA: Diagnosis not present

## 2017-08-24 DIAGNOSIS — K2950 Unspecified chronic gastritis without bleeding: Secondary | ICD-10-CM | POA: Diagnosis not present

## 2017-08-24 DIAGNOSIS — Z79899 Other long term (current) drug therapy: Secondary | ICD-10-CM | POA: Diagnosis not present

## 2017-08-24 DIAGNOSIS — K3189 Other diseases of stomach and duodenum: Secondary | ICD-10-CM | POA: Diagnosis not present

## 2017-08-24 DIAGNOSIS — I509 Heart failure, unspecified: Secondary | ICD-10-CM | POA: Insufficient documentation

## 2017-08-24 HISTORY — PX: COLONOSCOPY WITH PROPOFOL: SHX5780

## 2017-08-24 HISTORY — PX: BIOPSY: SHX5522

## 2017-08-24 HISTORY — PX: ESOPHAGOGASTRODUODENOSCOPY (EGD) WITH PROPOFOL: SHX5813

## 2017-08-24 LAB — GLUCOSE, CAPILLARY: Glucose-Capillary: 88 mg/dL (ref 70–99)

## 2017-08-24 SURGERY — COLONOSCOPY WITH PROPOFOL
Anesthesia: Monitor Anesthesia Care

## 2017-08-24 MED ORDER — PROPOFOL 10 MG/ML IV BOLUS
INTRAVENOUS | Status: DC | PRN
  Administered 2017-08-24: 20 mg via INTRAVENOUS

## 2017-08-24 MED ORDER — PROPOFOL 10 MG/ML IV BOLUS
INTRAVENOUS | Status: AC
Start: 2017-08-24 — End: ?
  Filled 2017-08-24: qty 40

## 2017-08-24 MED ORDER — SODIUM CHLORIDE 0.9 % IV SOLN: INTRAVENOUS | Status: DC

## 2017-08-24 MED ORDER — LIDOCAINE 2% (20 MG/ML) 5 ML SYRINGE
INTRAMUSCULAR | Status: DC | PRN
  Administered 2017-08-24: 50 mg via INTRAVENOUS

## 2017-08-24 MED ORDER — SODIUM CHLORIDE 0.9 % IV SOLN
INTRAVENOUS | Status: DC
  Administered 2017-08-24: 10:00:00 via INTRAVENOUS

## 2017-08-24 MED ORDER — PROPOFOL 500 MG/50ML IV EMUL
INTRAVENOUS | Status: DC | PRN
  Administered 2017-08-24: 125 ug/kg/min via INTRAVENOUS

## 2017-08-24 MED ORDER — EPHEDRINE SULFATE-NACL 50-0.9 MG/10ML-% IV SOSY
PREFILLED_SYRINGE | INTRAVENOUS | Status: DC | PRN
  Administered 2017-08-24 (×2): 10 mg via INTRAVENOUS

## 2017-08-24 MED ORDER — PROPOFOL 10 MG/ML IV BOLUS
INTRAVENOUS | Status: AC
  Filled 2017-08-24: qty 20

## 2017-08-24 SURGICAL SUPPLY — 24 items

## 2017-08-24 NOTE — Op Note (Signed)
[facility] Patient Name: [name] Procedure Date: 08/24/2017 MRN: [id number] Attending MD: Lear Ng , MD Date of Birth: 1945-06-22 CSN: [id number] Age: 72 Admit Type: Outpatient Procedure:                Colonoscopy Indications:              Last colonoscopy: November 2006, Heme positive                            stool, Iron deficiency anemia Providers:                Lear Ng, MD, Angus Seller, Cletis [place], Technician Referring MD:             [name] Medicines:                Propofol per Anesthesia, Monitored Anesthesia Care Complications:            No immediate complications. Estimated Blood Loss:     Estimated blood loss: none. Procedure:                Pre-Anesthesia Assessment:                           - Prior to the procedure, a History and Physical                            was performed, and patient medications and                            allergies were reviewed. The patient's tolerance of                            previous anesthesia was also reviewed. The risks                            and benefits of the procedure and the sedation                            options and risks were discussed with the patient.                            All questions were answered, and informed consent                            was obtained. Prior Anticoagulants: The patient has                            taken no previous anticoagulant or antiplatelet                            agents. ASA Grade Assessment: III - A patient with  severe systemic disease. After reviewing the risks                            and benefits, the patient was deemed in                            satisfactory condition to undergo the procedure.                           - Prior to the procedure, a History and Physical                            was performed, and patient medications and              allergies were reviewed. The patient's tolerance of                            previous anesthesia was also reviewed. The risks                            and benefits of the procedure and the sedation                            options and risks were discussed with the patient.                            All questions were answered, and informed consent                            was obtained. Prior Anticoagulants: The patient has                            taken no previous anticoagulant or antiplatelet                            agents. ASA Grade Assessment: III - A patient with                            severe systemic disease. After reviewing the risks                            and benefits, the patient was deemed in                            satisfactory condition to undergo the procedure.                           After obtaining informed consent, the colonoscope                            was passed under direct vision. Throughout the                            procedure, the patient's  blood pressure, pulse, and                            oxygen saturations were monitored continuously. The                            [id number]-[id number] ([id number] Olympus peds colonscope was                            introduced through the anus and advanced to the the                            cecum, identified by appendiceal orifice and                            ileocecal valve. The colonoscopy was somewhat                            difficult due to a tortuous colon and fair prep.                            Successful completion of the procedure was aided by                            straightening and shortening the scope to obtain                            bowel loop reduction and lavage. The patient                            tolerated the procedure well. The quality of the                            bowel preparation was fair and fair but repeated                            irrigation  led to a good and adequate prep. The                            terminal ileum, ileocecal valve, appendiceal                            orifice, and rectum were photographed. Scope In: 10:55:28 AM Scope Out: 11:08:57 AM Scope Withdrawal Time: 0 hours 9 minutes 40 seconds  Total Procedure Duration: 0 hours 13 minutes 29 seconds  Findings:      The perianal and digital rectal examinations were normal.      Internal hemorrhoids were found during retroflexion. The hemorrhoids       were medium-sized and Grade I (internal hemorrhoids that do not       prolapse).      The exam was otherwise normal throughout the examined colon. Impression:               - Preparation of the colon was fair.                           -  Internal hemorrhoids.                           - No specimens collected. Moderate Sedation:      N/A- Per Anesthesia Care Recommendation:           - Patient has a contact number available for                            emergencies. The signs and symptoms of potential                            delayed complications were discussed with the                            patient. Return to normal activities tomorrow.                            Written discharge instructions were provided to the                            patient.                           - Resume previous diet.                           - Repeat colonoscopy is not recommended for                            screening purposes.                           - Continue present medications. Procedure Code(s):        --- Professional ---                           [id number], Colonoscopy, flexible; diagnostic, including                            collection of specimen(s) by brushing or washing,                            when performed (separate procedure) Diagnosis Code(s):        --- Professional ---                           D50.9, Iron deficiency anemia, unspecified                           R19.5, Other fecal  abnormalities                           K64.0, First degree hemorrhoids CPT copyright 2017 American Medical Association. All rights reserved. The codes documented in this report are preliminary and upon coder review may  be revised to meet current compliance requirements. Lear Ng, MD 08/24/2017 11:24:31 AM This report has been signed  electronically. Number of Addenda: 0

## 2017-08-24 NOTE — Discharge Instructions (Signed)
YOU HAD AN ENDOSCOPIC PROCEDURE TODAY: Refer to the procedure report and other information in the discharge instructions given to you for any specific questions about what was found during the examination. If this information does not answer your questions, please call [facility] office at [phone] to clarify.   YOU SHOULD EXPECT: Some feelings of bloating in the abdomen. Passage of more gas than usual. Walking can help get rid of the air that was put into your GI tract during the procedure and reduce the bloating. If you had a lower endoscopy (such as a colonoscopy or flexible sigmoidoscopy) you may notice spotting of blood in your stool or on the toilet paper. Some abdominal soreness may be present for a day or two, also.  DIET: Your first meal following the procedure should be a light meal and then it is ok to progress to your normal diet. A half-sandwich or bowl of soup is an example of a good first meal. Heavy or fried foods are harder to digest and may make you feel nauseous or bloated. Drink plenty of fluids but you should avoid alcoholic beverages for 24 hours. If you had a esophageal dilation, please see attached instructions for diet.   ACTIVITY: Your care partner should take you home directly after the procedure. You should plan to take it easy, moving slowly for the rest of the day. You can resume normal activity the day after the procedure however YOU SHOULD NOT DRIVE, use power tools, machinery or perform tasks that involve climbing or major physical exertion for 24 hours (because of the sedation medicines used during the test).   SYMPTOMS TO REPORT IMMEDIATELY: A gastroenterologist can be reached at any hour. Please call [phone]  for any of the following symptoms:  Following lower endoscopy (colonoscopy, flexible sigmoidoscopy) Excessive amounts of blood in the stool  Significant tenderness, worsening of abdominal pains  Swelling of the abdomen that is new, acute  Fever of 100 or  higher  Following upper endoscopy (EGD, EUS, ERCP, esophageal dilation) Vomiting of blood or coffee ground material  New, significant abdominal pain  New, significant chest pain or pain under the shoulder blades  Painful or persistently difficult swallowing  New shortness of breath  Black, tarry-looking or red, bloody stools  FOLLOW UP:  If any biopsies were taken you will be contacted by phone or by letter within the next 1-3 weeks. Call [phone]  if you have not heard about the biopsies in 3 weeks.  Please also call with any specific questions about appointments or follow up tests.  Start OMEPRAZOLE 40 mg by mouth once a day (our office will electronically prescribe for the patient).

## 2017-08-24 NOTE — Interval H&P Note (Signed)
History and Physical Interval Note:  08/24/2017 10:26 AM  [name]  has presented today for surgery, with the diagnosis of Heme positive stool/anemia  The various methods of treatment have been discussed with the patient and family. After consideration of risks, benefits and other options for treatment, the patient has consented to  Procedure(s): COLONOSCOPY WITH PROPOFOL (N/A) ESOPHAGOGASTRODUODENOSCOPY (EGD) WITH PROPOFOL (N/A) as a surgical intervention .  The patient's history has been reviewed, patient examined, no change in status, stable for surgery.  I have reviewed the patient's chart and labs.  Questions were answered to the patient's satisfaction.     [place] [facility].

## 2017-08-24 NOTE — Op Note (Signed)
[facility] Patient Name: [name] Procedure Date: 08/24/2017 MRN: [id number] Attending MD: Lear Ng , MD Date of Birth: 1945-06-19 CSN: [id number] Age: 72 Admit Type: Outpatient Procedure:                Upper GI endoscopy Indications:              Iron deficiency anemia, Heme positive stool Providers:                Lear Ng, MD, Angus Seller, Cletis [place], Technician Referring MD:             [name] Medicines:                Propofol per Anesthesia, Monitored Anesthesia Care Complications:            No immediate complications. Estimated Blood Loss:     Estimated blood loss was minimal. Procedure:                Pre-Anesthesia Assessment:                           - Prior to the procedure, a History and Physical                            was performed, and patient medications and                            allergies were reviewed. The patient's tolerance of                            previous anesthesia was also reviewed. The risks                            and benefits of the procedure and the sedation                            options and risks were discussed with the patient.                            All questions were answered, and informed consent                            was obtained. Prior Anticoagulants: The patient has                            taken no previous anticoagulant or antiplatelet                            agents. ASA Grade Assessment: III - A patient with                            severe systemic disease. After reviewing the risks  and benefits, the patient was deemed in                            satisfactory condition to undergo the procedure.                           After obtaining informed consent, the endoscope was                            passed under direct vision. Throughout the                            procedure, the patient's blood  pressure, pulse, and                            oxygen saturations were monitored continuously. The                            [id number]-H190 ([id number]) Olympus adult endoscope was                            introduced through the mouth, and advanced to the                            second part of duodenum. The upper GI endoscopy was                            accomplished without difficulty. The patient                            tolerated the procedure fairly well. Scope In: Scope Out: Findings:      The examined esophagus was normal.      The Z-line was regular and was found 42 cm from the incisors.      Patchy moderate inflammation characterized by congestion (edema),       erosions, erythema and shallow ulcerations was found in the gastric       antrum. Biopsies were taken with a cold forceps for histology. Estimated       blood loss was minimal.      A small hiatal hernia was present.      Patchy moderate mucosal changes characterized by congestion, erythema,       erosion and ulceration were found in the duodenal bulb. Biopsies were       taken with a cold forceps for histology. Estimated blood loss was       minimal.      Patchy mild mucosal changes characterized by congestion, erythema and       erosion were found in the second portion of the duodenum. Biopsies were       taken with a cold forceps for histology. Estimated blood loss was       minimal. Impression:               - Normal esophagus.                           - Z-line regular, 42 cm from  the incisors.                           - Acute gastritis. Biopsied.                           - Small hiatal hernia.                           - Mucosal changes in the duodenum. Biopsied.                           - Mucosal changes in the duodenum. Biopsied. Moderate Sedation:      N/A- Per Anesthesia Care Recommendation:           - Patient has a contact number available for                            emergencies. The signs and  symptoms of potential                            delayed complications were discussed with the                            patient. Return to normal activities tomorrow.                            Written discharge instructions were provided to the                            patient.                           - Resume previous diet.                           - Await pathology results. Procedure Code(s):        --- Professional ---                           [phone], Esophagogastroduodenoscopy, flexible,                            transoral; with biopsy, single or multiple Diagnosis Code(s):        --- Professional ---                           D50.9, Iron deficiency anemia, unspecified                           R19.5, Other fecal abnormalities                           K29.00, Acute gastritis without bleeding                           K44.9, Diaphragmatic hernia without obstruction or  gangrene                           K31.89, Other diseases of stomach and duodenum CPT copyright 2017 [employer]. All rights reserved. The codes documented in this report are preliminary and upon coder review may  be revised to meet current compliance requirements. Lear Ng, MD 08/24/2017 11:21:13 AM This report has been signed electronically. Number of Addenda: 0

## 2017-08-24 NOTE — Anesthesia Preprocedure Evaluation (Addendum)
Anesthesia Evaluation  Patient identified by MRN, date of birth, ID band Patient awake    Reviewed: Allergy & Precautions, NPO status , Patient's Chart, lab work & pertinent test results, reviewed documented beta blocker date and time   Airway Mallampati: I       Dental  (+) Upper Dentures, Poor Dentition   Pulmonary former smoker,    Pulmonary exam normal breath sounds clear to auscultation       Cardiovascular hypertension, Pt. on home beta blockers Normal cardiovascular exam Rhythm:Regular Rate:Normal     Neuro/Psych    GI/Hepatic   Endo/Other  diabetes, Type 2, Insulin DependentMorbid obesity  Renal/GU DialysisRenal diseaseDialyzed Saturday. Lab values reviewed     Musculoskeletal   Abdominal (+) + obese,   Peds  Hematology  (+) anemia ,   Anesthesia Other Findings [name]  CARDIAC CATHETERIZATION  Order# [id number]  Reading physician: Dixie Dials, MD Ordering physician: Dixie Dials, MD Study date: 04/07/17 Physicians   Panel Physicians Referring Physician Case Authorizing Physician Dixie Dials, MD (Primary)   Procedures   CORONARY ANGIOGRAPHY (CATH LAB) Conclusion   No significant coronary artery disease. Acute heart failure with preserved LV systolic function. Indications   Precordial pain (R07.2 (ICD-10-CM)) Abnormal pharmacologic myocardial perfusion study (R94.30 (ICD-10-CM)) Procedural   [name]  ECHO COMPLETE WO IMAGING ENHANCING AGENT  Order# [id number]  Reading physician: Dixie Dials, MD Ordering physician: Dixie Dials, MD Study date: 04/06/17 Study Result   Result status: Final result                             *[facility]*                         [address], [place] [address]                             [phone]  ------------------------------------------------------------------- Transthoracic Echocardiography  Patient:    [name], [name] MR #:       [id number] Study Date: 04/06/2017 Gender:     F Age:        72 Height:     167.6 cm Weight:     124.2 kg BSA:        2.47 m^2 Pt. Status: Room:       Hilda, MD  ATTENDING    Charolette Forward, MD  ORDERING     Dixie Dials, MD  PERFORMING   Dixie Dials, MD  REFERRING    Dixie Dials, MD  SONOGRAPHER  Cardell Peach, RDCS  cc:  ------------------------------------------------------------------- LV EF: 60% -   65%  ------------------------------------------------------------------- Indications:      CHF - left ventricular 428.1.  ------------------------------------------------------------------- History:   PMH:   Coronary artery disease.  Congestive heart failure.  Risk factors:  Diabetes mellitus.  ------------------------------------------------------------------- Study Conclusions  - Left ventricle: The cavity size was normal. There was mild   concentric hypertrophy. Systolic function was normal. The   estimated ejection fraction was in the range of 60% to 65%. Wall   motion was  normal; there were no regional wall motion   abnormalities. Doppler parameters are consistent with abnormal   left ventricular relaxation (grade 1 diastolic dysfunction). - Aortic valve: Valve mobility was restricted. Transvalvular   velocity was increased, due to stenosis. There was mild stenosis.   Valve area (Vmax): 2.17 cm^2. - Mitral valve: Moderately to severely calcified annulus.   Moderately thickened, moderately calcified leaflets anterior and   posterior. Mobility was restricted. The findings are consistent   with mild stenosis. Valve area by pressure half-time: 2.42 cm^2.   Valve area by continuity equation (using LVOT flow): 1.62 cm^2. - Left atrium: The atrium was severely dilated. - Right  atrium: The atrium was moderately dilated.  ------------------------------------------------------------------- Study data:  Comparison was made to the study of 10/15/2016.  Study status:  Routine.  Procedure:  Transthoracic echocardiography. Image quality     Reproductive/Obstetrics                            Anesthesia Physical Anesthesia Plan  ASA: III  Anesthesia Plan: MAC   Post-op Pain Management:    Induction:   PONV Risk Score and Plan: 2 and Ondansetron and Treatment may vary due to age or medical condition  Airway Management Planned: Natural Airway, Nasal Cannula and Mask  Additional Equipment:   Intra-op Plan:   Post-operative Plan:   Informed Consent: I have reviewed the patients History and Physical, chart, labs and discussed the procedure including the risks, benefits and alternatives for the proposed anesthesia with the patient or authorized representative who has indicated his/her understanding and acceptance.   Dental advisory given  Plan Discussed with: CRNA  Anesthesia Plan Comments:         Anesthesia Quick Evaluation                                  Anesthesia Evaluation  Patient identified by MRN, date of birth, ID band Patient awake    Reviewed: Allergy & Precautions, NPO status , Patient's Chart, lab work & pertinent test results, reviewed documented beta blocker date and time   History of Anesthesia Complications Negative for: history of anesthetic complications  Airway Mallampati: I  TM Distance: >3 FB Neck ROM: Full    Dental  (+) Upper Dentures   Pulmonary shortness of breath, sleep apnea , former smoker,    breath sounds clear to auscultation       Cardiovascular hypertension, Pt. on medications and Pt. on home beta blockers + angina + CAD, + Past MI and +CHF   Rhythm:Regular     Neuro/Psych  Headaches,  Neuromuscular disease    GI/Hepatic Neg liver ROS, hiatal hernia,    Endo/Other  diabetes, Type 2, Insulin DependentMorbid obesity  Renal/GU ESRF and DialysisRenal disease     Musculoskeletal  (+) Arthritis ,   Abdominal   Peds  Hematology  (+) anemia ,   Anesthesia Other Findings   Reproductive/Obstetrics                             Anesthesia Physical Anesthesia Plan  ASA: III  Anesthesia Plan: General   Post-op Pain Management:    Induction: Intravenous  PONV Risk Score and Plan: 3 and Ondansetron and Dexamethasone  Airway Management Planned: Oral ETT  Additional Equipment: None  Intra-op Plan:   Post-operative Plan: Extubation in  OR  Informed Consent: I have reviewed the patients History and Physical, chart, labs and discussed the procedure including the risks, benefits and alternatives for the proposed anesthesia with the patient or authorized representative who has indicated his/her understanding and acceptance.   Dental advisory given  Plan Discussed with: CRNA and Surgeon  Anesthesia Plan Comments:         Anesthesia Quick Evaluation

## 2017-08-24 NOTE — H&P (Signed)
Date of Initial H&P: 07/31/17  History reviewed, patient examined, no change in status, stable for surgery.

## 2017-08-24 NOTE — Transfer of Care (Signed)
Immediate Anesthesia Transfer of Care Note  Patient: [name]  Procedure(s) Performed: COLONOSCOPY WITH PROPOFOL (N/A ) ESOPHAGOGASTRODUODENOSCOPY (EGD) WITH PROPOFOL (N/A ) BIOPSY  Patient Location: PACU  Anesthesia Type:General  Level of Consciousness: awake, alert  and oriented  Airway & Oxygen Therapy: Patient Spontanous Breathing and Patient connected to nasal cannula oxygen  Post-op Assessment: Report given to RN and Post -op Vital signs reviewed and stable  Post vital signs: Reviewed and stable  Last Vitals:  Vitals Value Taken Time  BP 114/33 08/24/2017 11:19 AM  Temp 36.6 C 08/24/2017 11:19 AM  Pulse 62 08/24/2017 11:19 AM  Resp 17 08/24/2017 11:20 AM  SpO2 99 % 08/24/2017 11:19 AM  Vitals shown include unvalidated device data.  Last Pain:  Vitals:   08/24/17 1119  TempSrc: Oral  PainSc: 0-No pain         Complications: No apparent anesthesia complications

## 2017-08-24 NOTE — Anesthesia Postprocedure Evaluation (Signed)
Anesthesia Post Note  Patient: [name]  Procedure(s) Performed: COLONOSCOPY WITH PROPOFOL (N/A ) ESOPHAGOGASTRODUODENOSCOPY (EGD) WITH PROPOFOL (N/A ) BIOPSY     Patient location during evaluation: Endoscopy Anesthesia Type: MAC Level of consciousness: awake and sedated Pain management: pain level controlled Vital Signs Assessment: post-procedure vital signs reviewed and stable Respiratory status: spontaneous breathing Cardiovascular status: stable Postop Assessment: no apparent nausea or vomiting Anesthetic complications: no    Last Vitals:  Vitals:   08/24/17 1120 08/24/17 1130  BP: 121/61 107/69  Pulse: 62 65  Resp: 19 17  Temp:    SpO2: 99% 95%    Last Pain:  Vitals:   08/24/17 1130  TempSrc:   PainSc: 0-No pain   Pain Goal:                 [name],[name]

## 2017-08-25 DIAGNOSIS — N2581 Secondary hyperparathyroidism of renal origin: Secondary | ICD-10-CM | POA: Diagnosis not present

## 2017-08-25 DIAGNOSIS — N186 End stage renal disease: Secondary | ICD-10-CM | POA: Diagnosis not present

## 2017-08-27 DIAGNOSIS — N2581 Secondary hyperparathyroidism of renal origin: Secondary | ICD-10-CM | POA: Diagnosis not present

## 2017-08-27 DIAGNOSIS — N186 End stage renal disease: Secondary | ICD-10-CM | POA: Diagnosis not present

## 2017-08-29 DIAGNOSIS — N2581 Secondary hyperparathyroidism of renal origin: Secondary | ICD-10-CM | POA: Diagnosis not present

## 2017-08-29 DIAGNOSIS — N186 End stage renal disease: Secondary | ICD-10-CM | POA: Diagnosis not present

## 2017-09-01 DIAGNOSIS — N186 End stage renal disease: Secondary | ICD-10-CM | POA: Diagnosis not present

## 2017-09-01 DIAGNOSIS — N2581 Secondary hyperparathyroidism of renal origin: Secondary | ICD-10-CM | POA: Diagnosis not present

## 2017-09-03 DIAGNOSIS — N2581 Secondary hyperparathyroidism of renal origin: Secondary | ICD-10-CM | POA: Diagnosis not present

## 2017-09-03 DIAGNOSIS — N186 End stage renal disease: Secondary | ICD-10-CM | POA: Diagnosis not present

## 2017-09-05 DIAGNOSIS — N2581 Secondary hyperparathyroidism of renal origin: Secondary | ICD-10-CM | POA: Diagnosis not present

## 2017-09-05 DIAGNOSIS — N186 End stage renal disease: Secondary | ICD-10-CM | POA: Diagnosis not present

## 2017-09-08 DIAGNOSIS — N2581 Secondary hyperparathyroidism of renal origin: Secondary | ICD-10-CM | POA: Diagnosis not present

## 2017-09-08 DIAGNOSIS — N186 End stage renal disease: Secondary | ICD-10-CM | POA: Diagnosis not present

## 2017-09-10 DIAGNOSIS — N2581 Secondary hyperparathyroidism of renal origin: Secondary | ICD-10-CM | POA: Diagnosis not present

## 2017-09-10 DIAGNOSIS — N186 End stage renal disease: Secondary | ICD-10-CM | POA: Diagnosis not present

## 2017-09-12 DIAGNOSIS — N2581 Secondary hyperparathyroidism of renal origin: Secondary | ICD-10-CM | POA: Diagnosis not present

## 2017-09-12 DIAGNOSIS — N186 End stage renal disease: Secondary | ICD-10-CM | POA: Diagnosis not present

## 2017-09-15 DIAGNOSIS — N2581 Secondary hyperparathyroidism of renal origin: Secondary | ICD-10-CM | POA: Diagnosis not present

## 2017-09-15 DIAGNOSIS — N186 End stage renal disease: Secondary | ICD-10-CM | POA: Diagnosis not present

## 2017-09-17 DIAGNOSIS — N2581 Secondary hyperparathyroidism of renal origin: Secondary | ICD-10-CM | POA: Diagnosis not present

## 2017-09-17 DIAGNOSIS — N186 End stage renal disease: Secondary | ICD-10-CM | POA: Diagnosis not present

## 2017-09-19 DIAGNOSIS — Z992 Dependence on renal dialysis: Secondary | ICD-10-CM | POA: Diagnosis not present

## 2017-09-19 DIAGNOSIS — E1129 Type 2 diabetes mellitus with other diabetic kidney complication: Secondary | ICD-10-CM | POA: Diagnosis not present

## 2017-09-19 DIAGNOSIS — N2581 Secondary hyperparathyroidism of renal origin: Secondary | ICD-10-CM | POA: Diagnosis not present

## 2017-09-19 DIAGNOSIS — N186 End stage renal disease: Secondary | ICD-10-CM | POA: Diagnosis not present

## 2017-09-21 DIAGNOSIS — R0901 Asphyxia: Secondary | ICD-10-CM | POA: Diagnosis not present

## 2017-09-21 DIAGNOSIS — G4733 Obstructive sleep apnea (adult) (pediatric): Secondary | ICD-10-CM | POA: Diagnosis not present

## 2017-09-21 DIAGNOSIS — I5043 Acute on chronic combined systolic (congestive) and diastolic (congestive) heart failure: Secondary | ICD-10-CM | POA: Diagnosis not present

## 2017-09-21 DIAGNOSIS — R0602 Shortness of breath: Secondary | ICD-10-CM | POA: Diagnosis not present

## 2017-09-21 DIAGNOSIS — I270 Primary pulmonary hypertension: Secondary | ICD-10-CM | POA: Diagnosis not present

## 2017-09-21 DIAGNOSIS — J449 Chronic obstructive pulmonary disease, unspecified: Secondary | ICD-10-CM | POA: Diagnosis not present

## 2017-09-22 DIAGNOSIS — N186 End stage renal disease: Secondary | ICD-10-CM | POA: Diagnosis not present

## 2017-09-22 DIAGNOSIS — N2581 Secondary hyperparathyroidism of renal origin: Secondary | ICD-10-CM | POA: Diagnosis not present

## 2017-09-24 DIAGNOSIS — N2581 Secondary hyperparathyroidism of renal origin: Secondary | ICD-10-CM | POA: Diagnosis not present

## 2017-09-24 DIAGNOSIS — N186 End stage renal disease: Secondary | ICD-10-CM | POA: Diagnosis not present

## 2017-09-26 DIAGNOSIS — N186 End stage renal disease: Secondary | ICD-10-CM | POA: Diagnosis not present

## 2017-09-26 DIAGNOSIS — N2581 Secondary hyperparathyroidism of renal origin: Secondary | ICD-10-CM | POA: Diagnosis not present

## 2017-09-29 DIAGNOSIS — N186 End stage renal disease: Secondary | ICD-10-CM | POA: Diagnosis not present

## 2017-09-29 DIAGNOSIS — N2581 Secondary hyperparathyroidism of renal origin: Secondary | ICD-10-CM | POA: Diagnosis not present

## 2017-10-01 DIAGNOSIS — N2581 Secondary hyperparathyroidism of renal origin: Secondary | ICD-10-CM | POA: Diagnosis not present

## 2017-10-01 DIAGNOSIS — N186 End stage renal disease: Secondary | ICD-10-CM | POA: Diagnosis not present

## 2017-10-03 DIAGNOSIS — N2581 Secondary hyperparathyroidism of renal origin: Secondary | ICD-10-CM | POA: Diagnosis not present

## 2017-10-03 DIAGNOSIS — N186 End stage renal disease: Secondary | ICD-10-CM | POA: Diagnosis not present

## 2017-10-03 IMAGING — CT CT ABD-PELV W/O CM
1 of 2 series · 15 of 32 positions shown, 19 images · non-contrast
Comparison: 04/11/2015

CLINICAL DATA: Umbilical pain. History of hernia repair with mesh.
Long-standing residual seroma with drainage. question recurrent
hernia or seroma.

EXAM:
CT ABDOMEN AND PELVIS WITHOUT CONTRAST
TECHNIQUE: Multidetector CT imaging of the abdomen and pelvis was performed
following the standard protocol without IV contrast.

[Series 2: abd/pelvis w/(date) · axial · 0.93mm/px · z∈[-466,-21]mm · 15 of 97 slices shown, 19 images]
[im 4/97  soft-tissue]
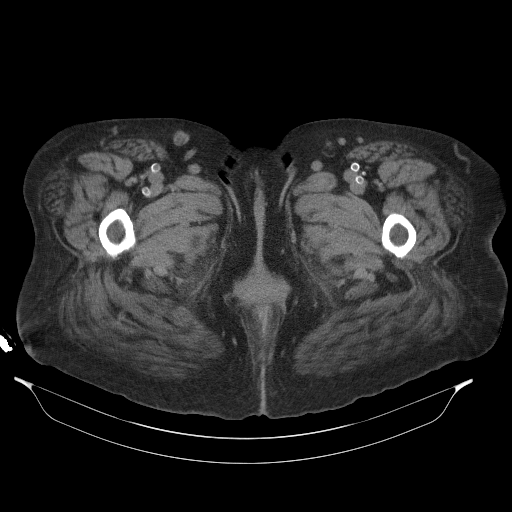
[im 4/97  bone]
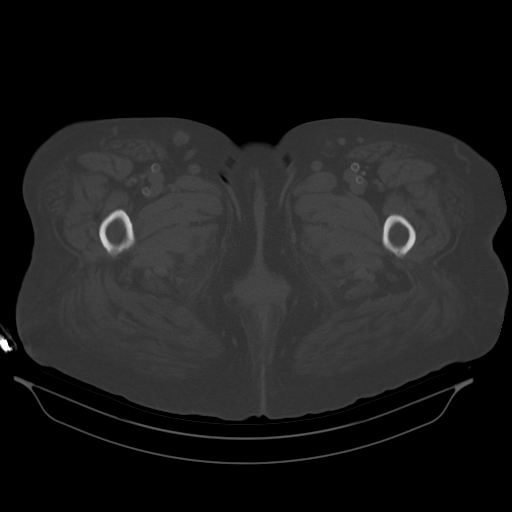
[im 12/97  soft-tissue]
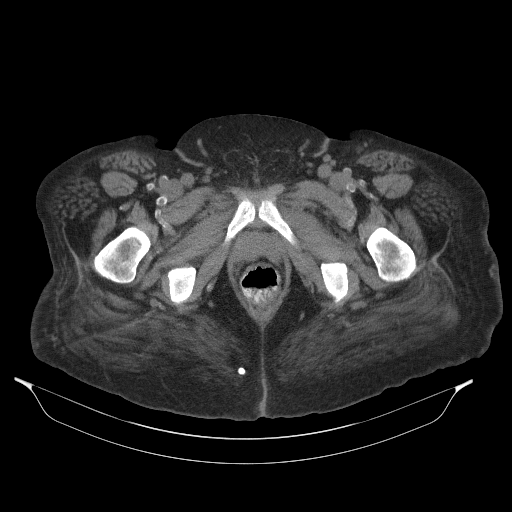
[im 20/97  soft-tissue]
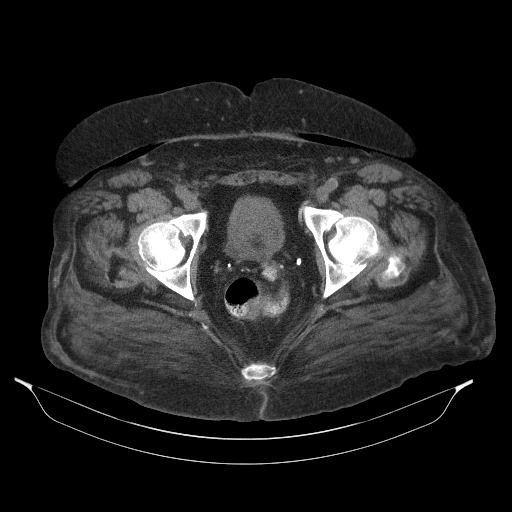
[im 27/97  soft-tissue]
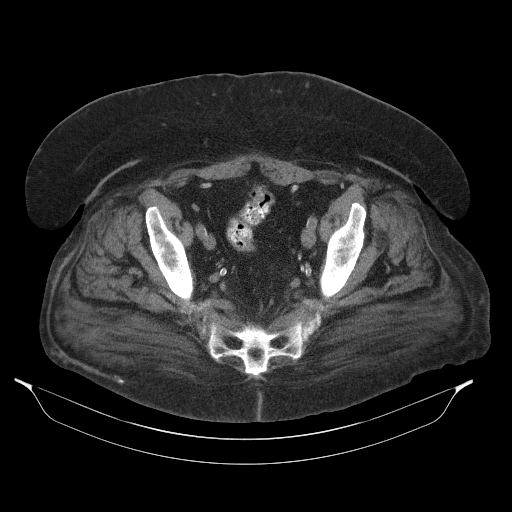
[im 35/97  soft-tissue]
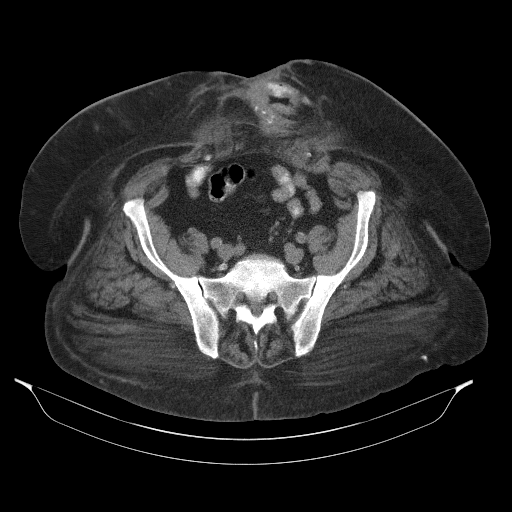
[im 43/97  soft-tissue]
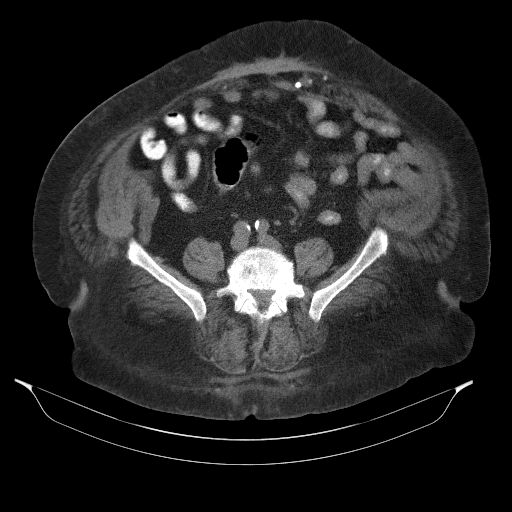
[im 50/97  soft-tissue]
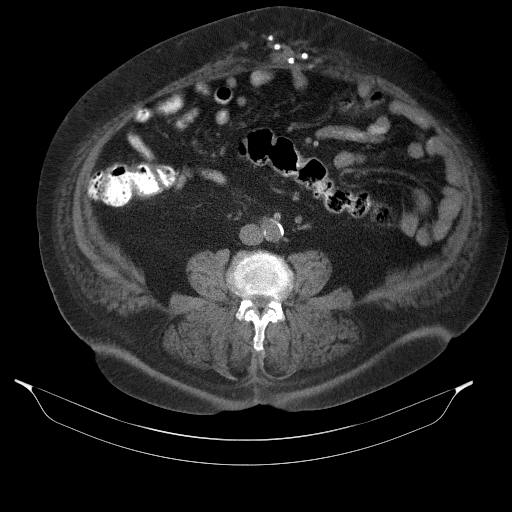
[im 54/97  soft-tissue]
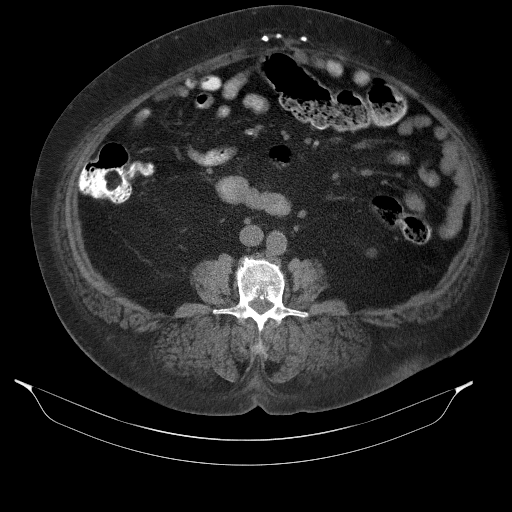
[im 62/97  soft-tissue]
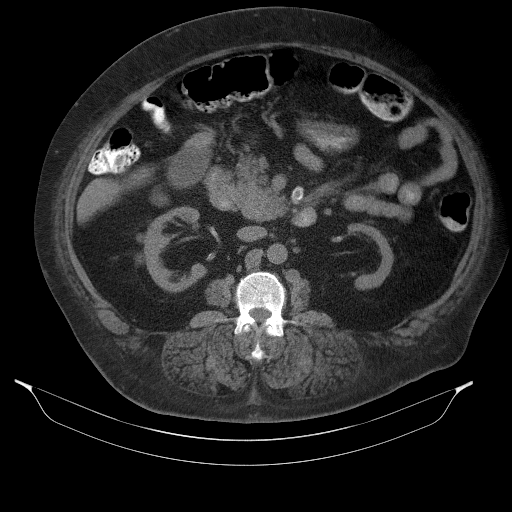
[im 62/97  bone]
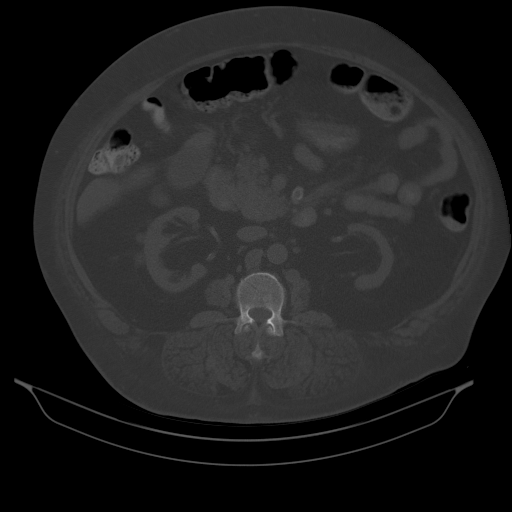
[im 70/97  soft-tissue]
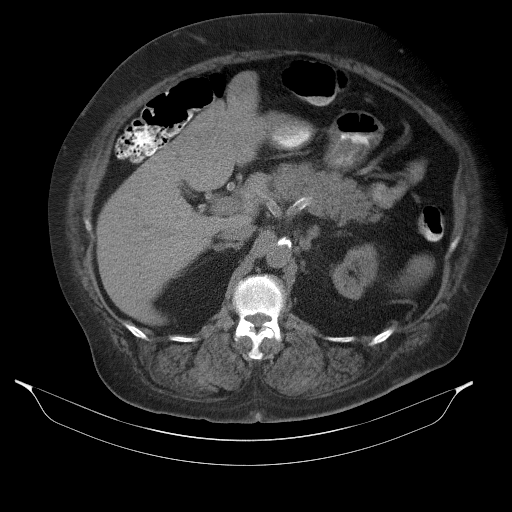
[im 77/97  soft-tissue]
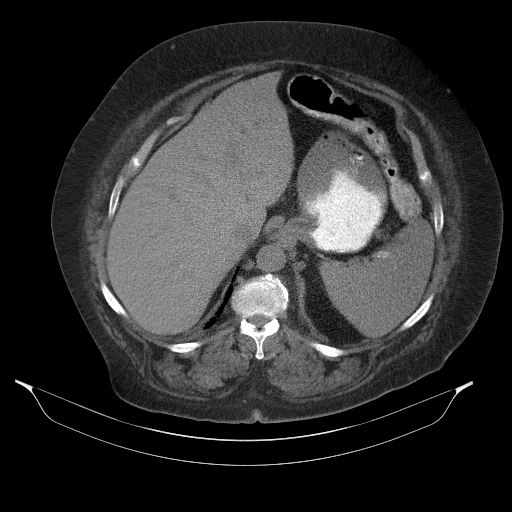
[im 81/97  lung]
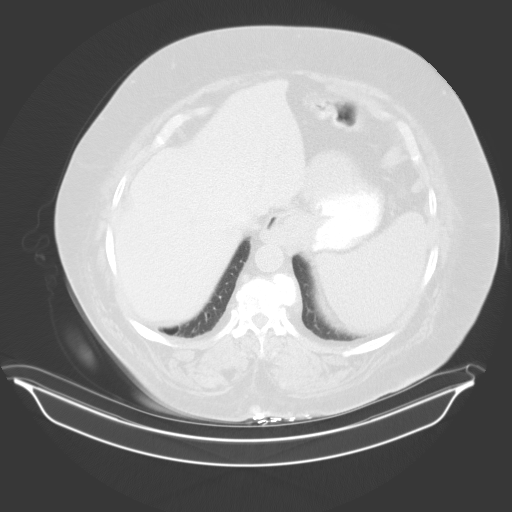
[im 85/97  soft-tissue]
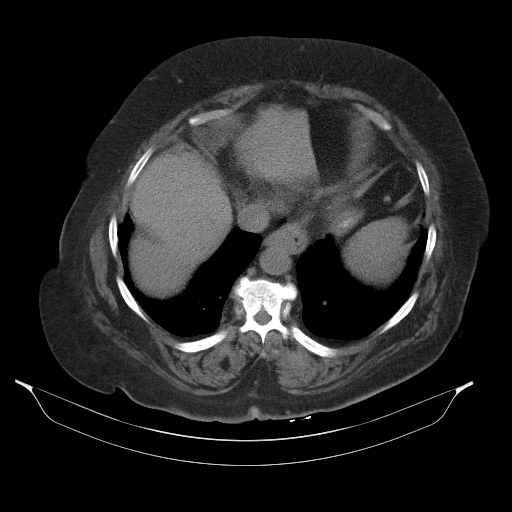
[im 85/97  lung]
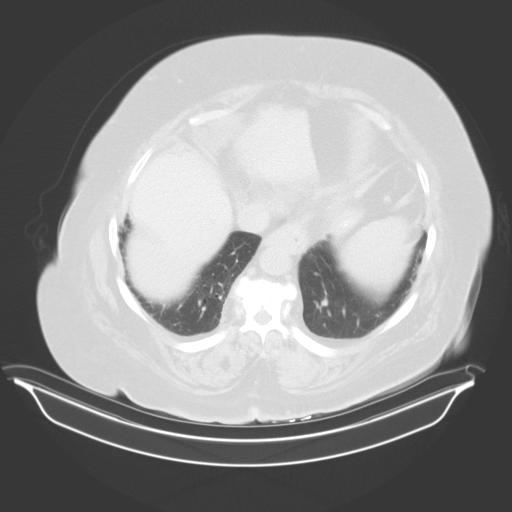
[im 89/97  lung]
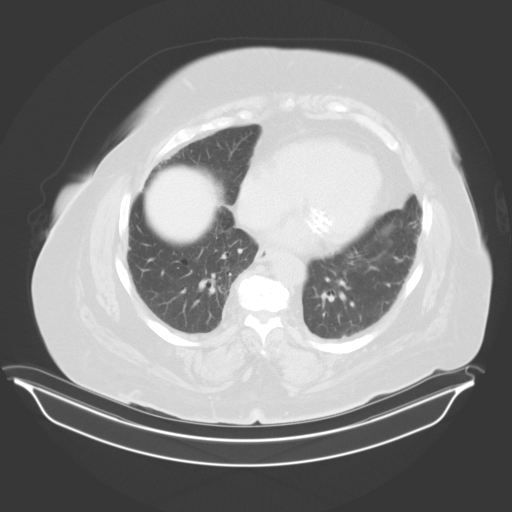
[im 93/97  soft-tissue]
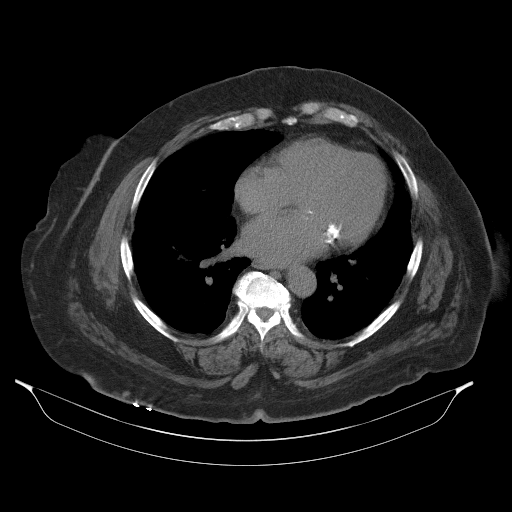
[im 93/97  lung]
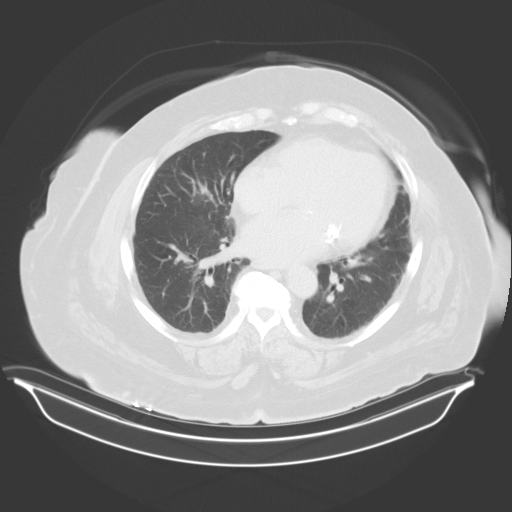

[15 of 32 positions shown; findings below may reference images not displayed]

FINDINGS: Lower chest: Heart is normal size. Lung bases clear. No effusions.
Small hiatal hernia.

Hepatobiliary: No focal hepatic abnormality. Gallbladder
unremarkable.

Pancreas: Diffuse prominence of the pancreas is noted, but unchanged
since prior study. No focal abnormality or ductal dilatation.

Spleen: No focal abnormality.  Normal size.

Adrenals/Urinary Tract: Severe bilateral renal cortical atrophy,
stable. Several small bilateral renal cysts. No hydronephrosis.
Adrenal glands are unremarkable. Urinary bladder decompressed.

Stomach/Bowel: Stomach, large and small bowel grossly unremarkable.

Vascular/Lymphatic: Aortic and iliac calcifications. No aneurysm or
adenopathy.

Reproductive: Prior hysterectomy.  No adnexal masses.

Other: No free fluid or free air. Again noted is the recurrent
ventral hernia containing small bowel loops. No evidence of bowel
obstruction. This is unchanged in the size or appearance since prior
study.

Musculoskeletal: No acute bony abnormality.
IMPRESSION: Stable appearance of the recurrent ventral hernia containing small
bowel loops. No evidence of bowel obstruction. No focal fluid
collection to suggest recurrent or residual seroma.

Small hiatal hernia.

Bilateral renal cortical atrophy/thinning.  Bilateral renal cysts.

No acute findings.

## 2017-10-05 DIAGNOSIS — Z794 Long term (current) use of insulin: Secondary | ICD-10-CM | POA: Diagnosis not present

## 2017-10-05 DIAGNOSIS — M109 Gout, unspecified: Secondary | ICD-10-CM | POA: Diagnosis not present

## 2017-10-05 DIAGNOSIS — I509 Heart failure, unspecified: Secondary | ICD-10-CM | POA: Diagnosis not present

## 2017-10-05 DIAGNOSIS — E1169 Type 2 diabetes mellitus with other specified complication: Secondary | ICD-10-CM | POA: Diagnosis not present

## 2017-10-05 DIAGNOSIS — I132 Hypertensive heart and chronic kidney disease with heart failure and with stage 5 chronic kidney disease, or end stage renal disease: Secondary | ICD-10-CM | POA: Diagnosis not present

## 2017-10-05 DIAGNOSIS — Z992 Dependence on renal dialysis: Secondary | ICD-10-CM | POA: Diagnosis not present

## 2017-10-05 DIAGNOSIS — M810 Age-related osteoporosis without current pathological fracture: Secondary | ICD-10-CM | POA: Diagnosis not present

## 2017-10-05 DIAGNOSIS — G894 Chronic pain syndrome: Secondary | ICD-10-CM | POA: Diagnosis not present

## 2017-10-05 DIAGNOSIS — I10 Essential (primary) hypertension: Secondary | ICD-10-CM | POA: Diagnosis not present

## 2017-10-05 DIAGNOSIS — N186 End stage renal disease: Secondary | ICD-10-CM | POA: Diagnosis not present

## 2017-10-06 DIAGNOSIS — N186 End stage renal disease: Secondary | ICD-10-CM | POA: Diagnosis not present

## 2017-10-06 DIAGNOSIS — N2581 Secondary hyperparathyroidism of renal origin: Secondary | ICD-10-CM | POA: Diagnosis not present

## 2017-10-08 ENCOUNTER — Other Ambulatory Visit: Payer: Self-pay

## 2017-10-08 ENCOUNTER — Encounter (HOSPITAL_COMMUNITY): Payer: Self-pay

## 2017-10-08 ENCOUNTER — Emergency Department (HOSPITAL_COMMUNITY)
Admission: EM | Admit: 2017-10-08 | Discharge: 2017-10-08 | Disposition: A | Discharge status: Home / Self Care | Payer: Medicare HMO | Attending: Emergency Medicine | Admitting: Emergency Medicine

## 2017-10-08 DIAGNOSIS — R109 Unspecified abdominal pain: Secondary | ICD-10-CM | POA: Diagnosis not present

## 2017-10-08 DIAGNOSIS — Z5321 Procedure and treatment not carried out due to patient leaving prior to being seen by health care provider: Secondary | ICD-10-CM | POA: Insufficient documentation

## 2017-10-08 DIAGNOSIS — N186 End stage renal disease: Secondary | ICD-10-CM | POA: Diagnosis not present

## 2017-10-08 DIAGNOSIS — N2581 Secondary hyperparathyroidism of renal origin: Secondary | ICD-10-CM | POA: Diagnosis not present

## 2017-10-08 LAB — COMPREHENSIVE METABOLIC PANEL
ALK PHOS: 68 U/L (ref 38–126)
ALT: 8 U/L (ref 0–44)
ANION GAP: 15 (ref 5–15)
AST: 21 U/L (ref 15–41)
Albumin: 3.2 g/dL — ABNORMAL LOW (ref 3.5–5.0)
BUN: 36 mg/dL — AB (ref 8–23)
CALCIUM: 8.7 mg/dL — AB (ref 8.9–10.3)
CO2: 29 mmol/L (ref 22–32)
Chloride: 97 mmol/L — ABNORMAL LOW (ref 98–111)
Creatinine, Ser: 8.92 mg/dL — ABNORMAL HIGH (ref 0.44–1.00)
GFR calc Af Amer: 4 mL/min — ABNORMAL LOW (ref >60)
GFR calc non Af Amer: 4 mL/min — ABNORMAL LOW (ref >60)
GLUCOSE: 137 mg/dL — AB (ref 70–99)
POTASSIUM: 4.4 mmol/L (ref 3.5–5.1)
SODIUM: 141 mmol/L (ref 135–145)
Total Bilirubin: 0.8 mg/dL (ref 0.3–1.2)
Total Protein: 7 g/dL (ref 6.5–8.1)

## 2017-10-08 LAB — CBC
HEMATOCRIT: 35.5 % — AB (ref 36.0–46.0)
HEMOGLOBIN: 10.9 g/dL — AB (ref 12.0–15.0)
MCH: 29.5 pg (ref 26.0–34.0)
MCHC: 30.7 g/dL (ref 30.0–36.0)
MCV: 95.9 fL (ref 78.0–100.0)
Platelets: 195 10*3/uL (ref 150–400)
RBC: 3.7 MIL/uL — ABNORMAL LOW (ref 3.87–5.11)
RDW: 16.1 % — ABNORMAL HIGH (ref 11.5–15.5)
WBC: 7.3 10*3/uL (ref 4.0–10.5)

## 2017-10-08 LAB — LIPASE, BLOOD: Lipase: 72 U/L — ABNORMAL HIGH (ref 11–51)

## 2017-10-08 NOTE — ED Notes (Signed)
Pt stated she is leaving b/c she has dialysis at 5am this morning and she doesn't want to miss her appointment. Pt stated she is going to follow up with her PCP in the morning.

## 2017-10-08 NOTE — ED Triage Notes (Signed)
Pt states that she ate a hamburger from [employer] at 11pm and since has had generalized abd pain, denies n/v/d

## 2017-10-10 DIAGNOSIS — N186 End stage renal disease: Secondary | ICD-10-CM | POA: Diagnosis not present

## 2017-10-10 DIAGNOSIS — N2581 Secondary hyperparathyroidism of renal origin: Secondary | ICD-10-CM | POA: Diagnosis not present

## 2017-10-13 DIAGNOSIS — N186 End stage renal disease: Secondary | ICD-10-CM | POA: Diagnosis not present

## 2017-10-13 DIAGNOSIS — N2581 Secondary hyperparathyroidism of renal origin: Secondary | ICD-10-CM | POA: Diagnosis not present

## 2017-10-15 DIAGNOSIS — N186 End stage renal disease: Secondary | ICD-10-CM | POA: Diagnosis not present

## 2017-10-15 DIAGNOSIS — N2581 Secondary hyperparathyroidism of renal origin: Secondary | ICD-10-CM | POA: Diagnosis not present

## 2017-10-16 DIAGNOSIS — N186 End stage renal disease: Secondary | ICD-10-CM | POA: Diagnosis not present

## 2017-10-16 DIAGNOSIS — N2581 Secondary hyperparathyroidism of renal origin: Secondary | ICD-10-CM | POA: Diagnosis not present

## 2017-10-19 DIAGNOSIS — E1129 Type 2 diabetes mellitus with other diabetic kidney complication: Secondary | ICD-10-CM | POA: Diagnosis not present

## 2017-10-19 DIAGNOSIS — N186 End stage renal disease: Secondary | ICD-10-CM | POA: Diagnosis not present

## 2017-10-19 DIAGNOSIS — Z992 Dependence on renal dialysis: Secondary | ICD-10-CM | POA: Diagnosis not present

## 2017-10-20 DIAGNOSIS — N186 End stage renal disease: Secondary | ICD-10-CM | POA: Diagnosis not present

## 2017-10-20 DIAGNOSIS — N2581 Secondary hyperparathyroidism of renal origin: Secondary | ICD-10-CM | POA: Diagnosis not present

## 2017-10-21 DIAGNOSIS — R0901 Asphyxia: Secondary | ICD-10-CM | POA: Diagnosis not present

## 2017-10-21 DIAGNOSIS — I270 Primary pulmonary hypertension: Secondary | ICD-10-CM | POA: Diagnosis not present

## 2017-10-21 DIAGNOSIS — I5043 Acute on chronic combined systolic (congestive) and diastolic (congestive) heart failure: Secondary | ICD-10-CM | POA: Diagnosis not present

## 2017-10-21 DIAGNOSIS — G4733 Obstructive sleep apnea (adult) (pediatric): Secondary | ICD-10-CM | POA: Diagnosis not present

## 2017-10-21 DIAGNOSIS — J449 Chronic obstructive pulmonary disease, unspecified: Secondary | ICD-10-CM | POA: Diagnosis not present

## 2017-10-21 DIAGNOSIS — R0602 Shortness of breath: Secondary | ICD-10-CM | POA: Diagnosis not present

## 2017-10-22 DIAGNOSIS — N186 End stage renal disease: Secondary | ICD-10-CM | POA: Diagnosis not present

## 2017-10-22 DIAGNOSIS — N2581 Secondary hyperparathyroidism of renal origin: Secondary | ICD-10-CM | POA: Diagnosis not present

## 2017-10-24 DIAGNOSIS — N186 End stage renal disease: Secondary | ICD-10-CM | POA: Diagnosis not present

## 2017-10-24 DIAGNOSIS — N2581 Secondary hyperparathyroidism of renal origin: Secondary | ICD-10-CM | POA: Diagnosis not present

## 2017-10-27 DIAGNOSIS — N186 End stage renal disease: Secondary | ICD-10-CM | POA: Diagnosis not present

## 2017-10-27 DIAGNOSIS — N2581 Secondary hyperparathyroidism of renal origin: Secondary | ICD-10-CM | POA: Diagnosis not present

## 2017-10-29 DIAGNOSIS — N2581 Secondary hyperparathyroidism of renal origin: Secondary | ICD-10-CM | POA: Diagnosis not present

## 2017-10-29 DIAGNOSIS — N186 End stage renal disease: Secondary | ICD-10-CM | POA: Diagnosis not present

## 2017-10-31 DIAGNOSIS — N186 End stage renal disease: Secondary | ICD-10-CM | POA: Diagnosis not present

## 2017-10-31 DIAGNOSIS — N2581 Secondary hyperparathyroidism of renal origin: Secondary | ICD-10-CM | POA: Diagnosis not present

## 2017-11-03 DIAGNOSIS — N2581 Secondary hyperparathyroidism of renal origin: Secondary | ICD-10-CM | POA: Diagnosis not present

## 2017-11-03 DIAGNOSIS — N186 End stage renal disease: Secondary | ICD-10-CM | POA: Diagnosis not present

## 2017-11-05 DIAGNOSIS — N186 End stage renal disease: Secondary | ICD-10-CM | POA: Diagnosis not present

## 2017-11-05 DIAGNOSIS — N2581 Secondary hyperparathyroidism of renal origin: Secondary | ICD-10-CM | POA: Diagnosis not present

## 2017-11-07 DIAGNOSIS — N2581 Secondary hyperparathyroidism of renal origin: Secondary | ICD-10-CM | POA: Diagnosis not present

## 2017-11-07 DIAGNOSIS — N186 End stage renal disease: Secondary | ICD-10-CM | POA: Diagnosis not present

## 2017-11-10 DIAGNOSIS — N2581 Secondary hyperparathyroidism of renal origin: Secondary | ICD-10-CM | POA: Diagnosis not present

## 2017-11-10 DIAGNOSIS — N186 End stage renal disease: Secondary | ICD-10-CM | POA: Diagnosis not present

## 2017-11-12 DIAGNOSIS — N186 End stage renal disease: Secondary | ICD-10-CM | POA: Diagnosis not present

## 2017-11-12 DIAGNOSIS — N2581 Secondary hyperparathyroidism of renal origin: Secondary | ICD-10-CM | POA: Diagnosis not present

## 2017-11-14 DIAGNOSIS — N2581 Secondary hyperparathyroidism of renal origin: Secondary | ICD-10-CM | POA: Diagnosis not present

## 2017-11-14 DIAGNOSIS — N186 End stage renal disease: Secondary | ICD-10-CM | POA: Diagnosis not present

## 2017-11-17 DIAGNOSIS — N2581 Secondary hyperparathyroidism of renal origin: Secondary | ICD-10-CM | POA: Diagnosis not present

## 2017-11-17 DIAGNOSIS — N186 End stage renal disease: Secondary | ICD-10-CM | POA: Diagnosis not present

## 2017-11-19 DIAGNOSIS — N2581 Secondary hyperparathyroidism of renal origin: Secondary | ICD-10-CM | POA: Diagnosis not present

## 2017-11-19 DIAGNOSIS — E1129 Type 2 diabetes mellitus with other diabetic kidney complication: Secondary | ICD-10-CM | POA: Diagnosis not present

## 2017-11-19 DIAGNOSIS — Z992 Dependence on renal dialysis: Secondary | ICD-10-CM | POA: Diagnosis not present

## 2017-11-19 DIAGNOSIS — N186 End stage renal disease: Secondary | ICD-10-CM | POA: Diagnosis not present

## 2017-11-21 DIAGNOSIS — N2581 Secondary hyperparathyroidism of renal origin: Secondary | ICD-10-CM | POA: Diagnosis not present

## 2017-11-21 DIAGNOSIS — N186 End stage renal disease: Secondary | ICD-10-CM | POA: Diagnosis not present

## 2017-11-24 DIAGNOSIS — N186 End stage renal disease: Secondary | ICD-10-CM | POA: Diagnosis not present

## 2017-11-24 DIAGNOSIS — N2581 Secondary hyperparathyroidism of renal origin: Secondary | ICD-10-CM | POA: Diagnosis not present

## 2017-11-24 DIAGNOSIS — R6889 Other general symptoms and signs: Secondary | ICD-10-CM | POA: Diagnosis not present

## 2017-11-26 DIAGNOSIS — R6889 Other general symptoms and signs: Secondary | ICD-10-CM | POA: Diagnosis not present

## 2017-11-26 DIAGNOSIS — N186 End stage renal disease: Secondary | ICD-10-CM | POA: Diagnosis not present

## 2017-11-26 DIAGNOSIS — N2581 Secondary hyperparathyroidism of renal origin: Secondary | ICD-10-CM | POA: Diagnosis not present

## 2017-11-28 DIAGNOSIS — N2581 Secondary hyperparathyroidism of renal origin: Secondary | ICD-10-CM | POA: Diagnosis not present

## 2017-11-28 DIAGNOSIS — N186 End stage renal disease: Secondary | ICD-10-CM | POA: Diagnosis not present

## 2017-11-28 DIAGNOSIS — R6889 Other general symptoms and signs: Secondary | ICD-10-CM | POA: Diagnosis not present

## 2017-12-01 DIAGNOSIS — R6889 Other general symptoms and signs: Secondary | ICD-10-CM | POA: Diagnosis not present

## 2017-12-01 DIAGNOSIS — N186 End stage renal disease: Secondary | ICD-10-CM | POA: Diagnosis not present

## 2017-12-01 DIAGNOSIS — N2581 Secondary hyperparathyroidism of renal origin: Secondary | ICD-10-CM | POA: Diagnosis not present

## 2017-12-03 DIAGNOSIS — R6889 Other general symptoms and signs: Secondary | ICD-10-CM | POA: Diagnosis not present

## 2017-12-03 DIAGNOSIS — N2581 Secondary hyperparathyroidism of renal origin: Secondary | ICD-10-CM | POA: Diagnosis not present

## 2017-12-03 DIAGNOSIS — N186 End stage renal disease: Secondary | ICD-10-CM | POA: Diagnosis not present

## 2017-12-05 DIAGNOSIS — N2581 Secondary hyperparathyroidism of renal origin: Secondary | ICD-10-CM | POA: Diagnosis not present

## 2017-12-05 DIAGNOSIS — N186 End stage renal disease: Secondary | ICD-10-CM | POA: Diagnosis not present

## 2017-12-05 DIAGNOSIS — R6889 Other general symptoms and signs: Secondary | ICD-10-CM | POA: Diagnosis not present

## 2017-12-08 DIAGNOSIS — N2581 Secondary hyperparathyroidism of renal origin: Secondary | ICD-10-CM | POA: Diagnosis not present

## 2017-12-08 DIAGNOSIS — N186 End stage renal disease: Secondary | ICD-10-CM | POA: Diagnosis not present

## 2017-12-08 DIAGNOSIS — R6889 Other general symptoms and signs: Secondary | ICD-10-CM | POA: Diagnosis not present

## 2017-12-10 DIAGNOSIS — N2581 Secondary hyperparathyroidism of renal origin: Secondary | ICD-10-CM | POA: Diagnosis not present

## 2017-12-10 DIAGNOSIS — R6889 Other general symptoms and signs: Secondary | ICD-10-CM | POA: Diagnosis not present

## 2017-12-10 DIAGNOSIS — N186 End stage renal disease: Secondary | ICD-10-CM | POA: Diagnosis not present

## 2017-12-12 DIAGNOSIS — N2581 Secondary hyperparathyroidism of renal origin: Secondary | ICD-10-CM | POA: Diagnosis not present

## 2017-12-12 DIAGNOSIS — R6889 Other general symptoms and signs: Secondary | ICD-10-CM | POA: Diagnosis not present

## 2017-12-12 DIAGNOSIS — N186 End stage renal disease: Secondary | ICD-10-CM | POA: Diagnosis not present

## 2017-12-14 DIAGNOSIS — N186 End stage renal disease: Secondary | ICD-10-CM | POA: Diagnosis not present

## 2017-12-14 DIAGNOSIS — N2581 Secondary hyperparathyroidism of renal origin: Secondary | ICD-10-CM | POA: Diagnosis not present

## 2017-12-14 DIAGNOSIS — R6889 Other general symptoms and signs: Secondary | ICD-10-CM | POA: Diagnosis not present

## 2017-12-16 DIAGNOSIS — R6889 Other general symptoms and signs: Secondary | ICD-10-CM | POA: Diagnosis not present

## 2017-12-16 DIAGNOSIS — N186 End stage renal disease: Secondary | ICD-10-CM | POA: Diagnosis not present

## 2017-12-16 DIAGNOSIS — N2581 Secondary hyperparathyroidism of renal origin: Secondary | ICD-10-CM | POA: Diagnosis not present

## 2017-12-19 DIAGNOSIS — Z992 Dependence on renal dialysis: Secondary | ICD-10-CM | POA: Diagnosis not present

## 2017-12-19 DIAGNOSIS — N2581 Secondary hyperparathyroidism of renal origin: Secondary | ICD-10-CM | POA: Diagnosis not present

## 2017-12-19 DIAGNOSIS — R6889 Other general symptoms and signs: Secondary | ICD-10-CM | POA: Diagnosis not present

## 2017-12-19 DIAGNOSIS — E1129 Type 2 diabetes mellitus with other diabetic kidney complication: Secondary | ICD-10-CM | POA: Diagnosis not present

## 2017-12-19 DIAGNOSIS — N186 End stage renal disease: Secondary | ICD-10-CM | POA: Diagnosis not present

## 2017-12-22 DIAGNOSIS — N2581 Secondary hyperparathyroidism of renal origin: Secondary | ICD-10-CM | POA: Diagnosis not present

## 2017-12-22 DIAGNOSIS — R6889 Other general symptoms and signs: Secondary | ICD-10-CM | POA: Diagnosis not present

## 2017-12-22 DIAGNOSIS — N186 End stage renal disease: Secondary | ICD-10-CM | POA: Diagnosis not present

## 2017-12-24 DIAGNOSIS — N2581 Secondary hyperparathyroidism of renal origin: Secondary | ICD-10-CM | POA: Diagnosis not present

## 2017-12-24 DIAGNOSIS — R6889 Other general symptoms and signs: Secondary | ICD-10-CM | POA: Diagnosis not present

## 2017-12-24 DIAGNOSIS — N186 End stage renal disease: Secondary | ICD-10-CM | POA: Diagnosis not present

## 2017-12-26 DIAGNOSIS — N2581 Secondary hyperparathyroidism of renal origin: Secondary | ICD-10-CM | POA: Diagnosis not present

## 2017-12-26 DIAGNOSIS — N186 End stage renal disease: Secondary | ICD-10-CM | POA: Diagnosis not present

## 2017-12-26 DIAGNOSIS — R6889 Other general symptoms and signs: Secondary | ICD-10-CM | POA: Diagnosis not present

## 2017-12-29 DIAGNOSIS — N2581 Secondary hyperparathyroidism of renal origin: Secondary | ICD-10-CM | POA: Diagnosis not present

## 2017-12-29 DIAGNOSIS — N186 End stage renal disease: Secondary | ICD-10-CM | POA: Diagnosis not present

## 2017-12-29 DIAGNOSIS — R6889 Other general symptoms and signs: Secondary | ICD-10-CM | POA: Diagnosis not present

## 2017-12-31 DIAGNOSIS — N186 End stage renal disease: Secondary | ICD-10-CM | POA: Diagnosis not present

## 2017-12-31 DIAGNOSIS — N2581 Secondary hyperparathyroidism of renal origin: Secondary | ICD-10-CM | POA: Diagnosis not present

## 2017-12-31 DIAGNOSIS — R6889 Other general symptoms and signs: Secondary | ICD-10-CM | POA: Diagnosis not present

## 2018-01-02 DIAGNOSIS — N186 End stage renal disease: Secondary | ICD-10-CM | POA: Diagnosis not present

## 2018-01-02 DIAGNOSIS — R6889 Other general symptoms and signs: Secondary | ICD-10-CM | POA: Diagnosis not present

## 2018-01-02 DIAGNOSIS — N2581 Secondary hyperparathyroidism of renal origin: Secondary | ICD-10-CM | POA: Diagnosis not present

## 2018-01-05 DIAGNOSIS — N2581 Secondary hyperparathyroidism of renal origin: Secondary | ICD-10-CM | POA: Diagnosis not present

## 2018-01-05 DIAGNOSIS — R6889 Other general symptoms and signs: Secondary | ICD-10-CM | POA: Diagnosis not present

## 2018-01-05 DIAGNOSIS — N186 End stage renal disease: Secondary | ICD-10-CM | POA: Diagnosis not present

## 2018-01-07 DIAGNOSIS — R6889 Other general symptoms and signs: Secondary | ICD-10-CM | POA: Diagnosis not present

## 2018-01-07 DIAGNOSIS — N2581 Secondary hyperparathyroidism of renal origin: Secondary | ICD-10-CM | POA: Diagnosis not present

## 2018-01-07 DIAGNOSIS — N186 End stage renal disease: Secondary | ICD-10-CM | POA: Diagnosis not present

## 2018-01-08 DIAGNOSIS — N186 End stage renal disease: Secondary | ICD-10-CM | POA: Diagnosis not present

## 2018-01-08 DIAGNOSIS — N2581 Secondary hyperparathyroidism of renal origin: Secondary | ICD-10-CM | POA: Diagnosis not present

## 2018-01-09 DIAGNOSIS — R6889 Other general symptoms and signs: Secondary | ICD-10-CM | POA: Diagnosis not present

## 2018-01-11 DIAGNOSIS — R6889 Other general symptoms and signs: Secondary | ICD-10-CM | POA: Diagnosis not present

## 2018-01-11 DIAGNOSIS — N186 End stage renal disease: Secondary | ICD-10-CM | POA: Diagnosis not present

## 2018-01-11 DIAGNOSIS — N2581 Secondary hyperparathyroidism of renal origin: Secondary | ICD-10-CM | POA: Diagnosis not present

## 2018-01-14 DIAGNOSIS — N186 End stage renal disease: Secondary | ICD-10-CM | POA: Diagnosis not present

## 2018-01-14 DIAGNOSIS — R6889 Other general symptoms and signs: Secondary | ICD-10-CM | POA: Diagnosis not present

## 2018-01-14 DIAGNOSIS — N2581 Secondary hyperparathyroidism of renal origin: Secondary | ICD-10-CM | POA: Diagnosis not present

## 2018-01-16 ENCOUNTER — Other Ambulatory Visit: Payer: Self-pay

## 2018-01-16 ENCOUNTER — Encounter (HOSPITAL_COMMUNITY): Payer: Self-pay | Admitting: Emergency Medicine

## 2018-01-16 ENCOUNTER — Ambulatory Visit (INDEPENDENT_AMBULATORY_CARE_PROVIDER_SITE_OTHER)
Admission: EM | Admit: 2018-01-16 | Discharge: 2018-01-16 | Disposition: A | Discharge status: Nursing Home (Includes ICF) | Payer: Medicare HMO | Source: Home / Self Care

## 2018-01-16 ENCOUNTER — Inpatient Hospital Stay (HOSPITAL_COMMUNITY)
Admission: EM | Admit: 2018-01-16 | Discharge: 2018-01-24 | DRG: 190 | Disposition: A | Discharge status: Home Health | Payer: Medicare HMO | Attending: Internal Medicine | Admitting: Internal Medicine

## 2018-01-16 ENCOUNTER — Emergency Department (HOSPITAL_COMMUNITY): Payer: Medicare HMO

## 2018-01-16 DIAGNOSIS — E8889 Other specified metabolic disorders: Secondary | ICD-10-CM | POA: Diagnosis present

## 2018-01-16 DIAGNOSIS — Z87891 Personal history of nicotine dependence: Secondary | ICD-10-CM

## 2018-01-16 DIAGNOSIS — J441 Chronic obstructive pulmonary disease with (acute) exacerbation: Secondary | ICD-10-CM | POA: Diagnosis present

## 2018-01-16 DIAGNOSIS — D631 Anemia in chronic kidney disease: Secondary | ICD-10-CM | POA: Diagnosis present

## 2018-01-16 DIAGNOSIS — I10 Essential (primary) hypertension: Secondary | ICD-10-CM | POA: Diagnosis not present

## 2018-01-16 DIAGNOSIS — Z992 Dependence on renal dialysis: Secondary | ICD-10-CM

## 2018-01-16 DIAGNOSIS — E785 Hyperlipidemia, unspecified: Secondary | ICD-10-CM | POA: Diagnosis present

## 2018-01-16 DIAGNOSIS — Z7982 Long term (current) use of aspirin: Secondary | ICD-10-CM

## 2018-01-16 DIAGNOSIS — G8929 Other chronic pain: Secondary | ICD-10-CM | POA: Diagnosis present

## 2018-01-16 DIAGNOSIS — E1122 Type 2 diabetes mellitus with diabetic chronic kidney disease: Secondary | ICD-10-CM | POA: Diagnosis present

## 2018-01-16 DIAGNOSIS — H5461 Unqualified visual loss, right eye, normal vision left eye: Secondary | ICD-10-CM | POA: Diagnosis present

## 2018-01-16 DIAGNOSIS — E871 Hypo-osmolality and hyponatremia: Secondary | ICD-10-CM | POA: Diagnosis present

## 2018-01-16 DIAGNOSIS — J206 Acute bronchitis due to rhinovirus: Secondary | ICD-10-CM | POA: Diagnosis present

## 2018-01-16 DIAGNOSIS — Z6841 Body Mass Index (BMI) 40.0 and over, adult: Secondary | ICD-10-CM | POA: Diagnosis not present

## 2018-01-16 DIAGNOSIS — J209 Acute bronchitis, unspecified: Secondary | ICD-10-CM | POA: Diagnosis present

## 2018-01-16 DIAGNOSIS — R0602 Shortness of breath: Secondary | ICD-10-CM

## 2018-01-16 DIAGNOSIS — M109 Gout, unspecified: Secondary | ICD-10-CM | POA: Diagnosis present

## 2018-01-16 DIAGNOSIS — J80 Acute respiratory distress syndrome: Secondary | ICD-10-CM | POA: Diagnosis not present

## 2018-01-16 DIAGNOSIS — J432 Centrilobular emphysema: Secondary | ICD-10-CM | POA: Diagnosis present

## 2018-01-16 DIAGNOSIS — R079 Chest pain, unspecified: Secondary | ICD-10-CM | POA: Diagnosis not present

## 2018-01-16 DIAGNOSIS — J440 Chronic obstructive pulmonary disease with acute lower respiratory infection: Secondary | ICD-10-CM | POA: Diagnosis present

## 2018-01-16 DIAGNOSIS — M545 Low back pain: Secondary | ICD-10-CM | POA: Diagnosis present

## 2018-01-16 DIAGNOSIS — R05 Cough: Secondary | ICD-10-CM | POA: Diagnosis not present

## 2018-01-16 DIAGNOSIS — G4733 Obstructive sleep apnea (adult) (pediatric): Secondary | ICD-10-CM | POA: Diagnosis present

## 2018-01-16 DIAGNOSIS — J9811 Atelectasis: Secondary | ICD-10-CM | POA: Diagnosis present

## 2018-01-16 DIAGNOSIS — M1990 Unspecified osteoarthritis, unspecified site: Secondary | ICD-10-CM | POA: Diagnosis present

## 2018-01-16 DIAGNOSIS — I120 Hypertensive chronic kidney disease with stage 5 chronic kidney disease or end stage renal disease: Secondary | ICD-10-CM | POA: Diagnosis not present

## 2018-01-16 DIAGNOSIS — N2581 Secondary hyperparathyroidism of renal origin: Secondary | ICD-10-CM | POA: Diagnosis present

## 2018-01-16 DIAGNOSIS — Z9989 Dependence on other enabling machines and devices: Secondary | ICD-10-CM | POA: Diagnosis not present

## 2018-01-16 DIAGNOSIS — R0603 Acute respiratory distress: Secondary | ICD-10-CM | POA: Diagnosis present

## 2018-01-16 DIAGNOSIS — I5032 Chronic diastolic (congestive) heart failure: Secondary | ICD-10-CM | POA: Diagnosis present

## 2018-01-16 DIAGNOSIS — I700 Atherosclerosis of aorta: Secondary | ICD-10-CM | POA: Diagnosis present

## 2018-01-16 DIAGNOSIS — I252 Old myocardial infarction: Secondary | ICD-10-CM

## 2018-01-16 DIAGNOSIS — E1129 Type 2 diabetes mellitus with other diabetic kidney complication: Secondary | ICD-10-CM | POA: Diagnosis not present

## 2018-01-16 DIAGNOSIS — Z9841 Cataract extraction status, right eye: Secondary | ICD-10-CM

## 2018-01-16 DIAGNOSIS — Z9842 Cataract extraction status, left eye: Secondary | ICD-10-CM

## 2018-01-16 DIAGNOSIS — R0600 Dyspnea, unspecified: Secondary | ICD-10-CM

## 2018-01-16 DIAGNOSIS — I132 Hypertensive heart and chronic kidney disease with heart failure and with stage 5 chronic kidney disease, or end stage renal disease: Secondary | ICD-10-CM | POA: Diagnosis present

## 2018-01-16 DIAGNOSIS — E1140 Type 2 diabetes mellitus with diabetic neuropathy, unspecified: Secondary | ICD-10-CM | POA: Diagnosis present

## 2018-01-16 DIAGNOSIS — N186 End stage renal disease: Secondary | ICD-10-CM | POA: Diagnosis present

## 2018-01-16 DIAGNOSIS — R0689 Other abnormalities of breathing: Secondary | ICD-10-CM | POA: Diagnosis not present

## 2018-01-16 DIAGNOSIS — Z90710 Acquired absence of both cervix and uterus: Secondary | ICD-10-CM

## 2018-01-16 DIAGNOSIS — Z961 Presence of intraocular lens: Secondary | ICD-10-CM | POA: Diagnosis present

## 2018-01-16 DIAGNOSIS — Z79899 Other long term (current) drug therapy: Secondary | ICD-10-CM

## 2018-01-16 DIAGNOSIS — I959 Hypotension, unspecified: Secondary | ICD-10-CM | POA: Diagnosis present

## 2018-01-16 DIAGNOSIS — R6889 Other general symptoms and signs: Secondary | ICD-10-CM | POA: Diagnosis not present

## 2018-01-16 DIAGNOSIS — I2510 Atherosclerotic heart disease of native coronary artery without angina pectoris: Secondary | ICD-10-CM | POA: Diagnosis present

## 2018-01-16 DIAGNOSIS — R069 Unspecified abnormalities of breathing: Secondary | ICD-10-CM | POA: Diagnosis not present

## 2018-01-16 DIAGNOSIS — Z8249 Family history of ischemic heart disease and other diseases of the circulatory system: Secondary | ICD-10-CM

## 2018-01-16 DIAGNOSIS — Z794 Long term (current) use of insulin: Secondary | ICD-10-CM

## 2018-01-16 DIAGNOSIS — Z96652 Presence of left artificial knee joint: Secondary | ICD-10-CM | POA: Diagnosis present

## 2018-01-16 LAB — CBC WITH DIFFERENTIAL/PLATELET
Abs Immature Granulocytes: 0.05 10*3/uL (ref 0.00–0.07)
BASOS ABS: 0.1 10*3/uL (ref 0.0–0.1)
Basophils Relative: 1 %
EOS ABS: 0.7 10*3/uL — AB (ref 0.0–0.5)
EOS PCT: 6 %
HCT: 35.8 % — ABNORMAL LOW (ref 36.0–46.0)
HEMOGLOBIN: 10.9 g/dL — AB (ref 12.0–15.0)
Immature Granulocytes: 1 %
LYMPHS PCT: 25 %
Lymphs Abs: 2.6 10*3/uL (ref 0.7–4.0)
MCH: 28.7 pg (ref 26.0–34.0)
MCHC: 30.4 g/dL (ref 30.0–36.0)
MCV: 94.2 fL (ref 80.0–100.0)
MONO ABS: 1 10*3/uL (ref 0.1–1.0)
Monocytes Relative: 9 %
NRBC: 0 % (ref 0.0–0.2)
Neutro Abs: 5.8 10*3/uL (ref 1.7–7.7)
Neutrophils Relative %: 58 %
Platelets: 183 10*3/uL (ref 150–400)
RBC: 3.8 MIL/uL — AB (ref 3.87–5.11)
RDW: 16.4 % — AB (ref 11.5–15.5)
WBC: 10.1 10*3/uL (ref 4.0–10.5)

## 2018-01-16 LAB — I-STAT CG4 LACTIC ACID, ED
LACTIC ACID, VENOUS: 1.26 mmol/L (ref 0.5–1.9)
Lactic Acid, Venous: 1.47 mmol/L (ref 0.5–1.9)

## 2018-01-16 LAB — I-STAT VENOUS BLOOD GAS, ED
ACID-BASE EXCESS: 6 mmol/L — AB (ref 0.0–2.0)
Bicarbonate: 33.6 mmol/L — ABNORMAL HIGH (ref 20.0–28.0)
O2 Saturation: 78 %
Patient temperature: 98
TCO2: 35 mmol/L — ABNORMAL HIGH (ref 22–32)
pCO2, Ven: 54 mmHg (ref 44.0–60.0)
pH, Ven: 7.401 (ref 7.250–7.430)
pO2, Ven: 43 mmHg (ref 32.0–45.0)

## 2018-01-16 LAB — BASIC METABOLIC PANEL
Anion gap: 16 — ABNORMAL HIGH (ref 5–15)
BUN: 29 mg/dL — AB (ref 8–23)
CALCIUM: 8.9 mg/dL (ref 8.9–10.3)
CHLORIDE: 97 mmol/L — AB (ref 98–111)
CO2: 28 mmol/L (ref 22–32)
CREATININE: 6.97 mg/dL — AB (ref 0.44–1.00)
GFR calc Af Amer: 6 mL/min — ABNORMAL LOW (ref >60)
GFR, EST NON AFRICAN AMERICAN: 5 mL/min — AB (ref >60)
Glucose, Bld: 106 mg/dL — ABNORMAL HIGH (ref 70–99)
Potassium: 4.2 mmol/L (ref 3.5–5.1)
SODIUM: 141 mmol/L (ref 135–145)

## 2018-01-16 LAB — I-STAT CHEM 8, ED
BUN: 34 mg/dL — ABNORMAL HIGH (ref 8–23)
Calcium, Ion: 1.04 mmol/L — ABNORMAL LOW (ref 1.15–1.40)
Chloride: 99 mmol/L (ref 98–111)
Creatinine, Ser: 7 mg/dL — ABNORMAL HIGH (ref 0.44–1.00)
Glucose, Bld: 102 mg/dL — ABNORMAL HIGH (ref 70–99)
HEMATOCRIT: 36 % (ref 36.0–46.0)
Hemoglobin: 12.2 g/dL (ref 12.0–15.0)
Potassium: 4.1 mmol/L (ref 3.5–5.1)
Sodium: 141 mmol/L (ref 135–145)
TCO2: 35 mmol/L — AB (ref 22–32)

## 2018-01-16 LAB — I-STAT TROPONIN, ED: TROPONIN I, POC: 0.02 ng/mL (ref 0.00–0.08)

## 2018-01-16 LAB — PROTIME-INR
INR: 1.13
Prothrombin Time: 14.4 seconds (ref 11.4–15.2)

## 2018-01-16 LAB — GLUCOSE, CAPILLARY: Glucose-Capillary: 147 mg/dL — ABNORMAL HIGH (ref 70–99)

## 2018-01-16 LAB — BRAIN NATRIURETIC PEPTIDE: B Natriuretic Peptide: 118.7 pg/mL — ABNORMAL HIGH (ref 0.0–100.0)

## 2018-01-16 MED ORDER — INSULIN ASPART 100 UNIT/ML ~~LOC~~ SOLN
0.0000 [IU] | Freq: Three times a day (TID) | SUBCUTANEOUS | Status: DC
  Administered 2018-01-17 – 2018-01-18 (×5): 2 [IU] via SUBCUTANEOUS
  Administered 2018-01-18: 5 [IU] via SUBCUTANEOUS
  Administered 2018-01-19: 2 [IU] via SUBCUTANEOUS
  Administered 2018-01-19: 7 [IU] via SUBCUTANEOUS
  Administered 2018-01-20: 1 [IU] via SUBCUTANEOUS
  Administered 2018-01-20: 3 [IU] via SUBCUTANEOUS
  Administered 2018-01-21: 1 [IU] via SUBCUTANEOUS

## 2018-01-16 MED ORDER — AZITHROMYCIN 250 MG PO TABS
250.0000 mg | ORAL_TABLET | Freq: Every day | ORAL | Status: DC
  Administered 2018-01-18 – 2018-01-19 (×2): 250 mg via ORAL
  Filled 2018-01-16 (×2): qty 1

## 2018-01-16 MED ORDER — LACTATED RINGERS IV BOLUS
500.0000 mL | Freq: Once | INTRAVENOUS | Status: AC
  Administered 2018-01-16: 500 mL via INTRAVENOUS

## 2018-01-16 MED ORDER — AZITHROMYCIN 250 MG PO TABS
500.0000 mg | ORAL_TABLET | Freq: Every day | ORAL | Status: AC
  Administered 2018-01-17: 500 mg via ORAL
  Filled 2018-01-16: qty 2

## 2018-01-16 MED ORDER — IPRATROPIUM-ALBUTEROL 0.5-2.5 (3) MG/3ML IN SOLN
3.0000 mL | RESPIRATORY_TRACT | Status: DC
  Administered 2018-01-16 – 2018-01-17 (×3): 3 mL via RESPIRATORY_TRACT
  Filled 2018-01-16 (×3): qty 3

## 2018-01-16 MED ORDER — IPRATROPIUM-ALBUTEROL 0.5-2.5 (3) MG/3ML IN SOLN
3.0000 mL | RESPIRATORY_TRACT | Status: DC
  Administered 2018-01-16: 3 mL via RESPIRATORY_TRACT
  Filled 2018-01-16: qty 3

## 2018-01-16 MED ORDER — MIDODRINE HCL 5 MG PO TABS
10.0000 mg | ORAL_TABLET | Freq: Three times a day (TID) | ORAL | Status: DC
  Administered 2018-01-16 – 2018-01-24 (×26): 10 mg via ORAL
  Filled 2018-01-16 (×22): qty 2

## 2018-01-16 MED ORDER — HYDRALAZINE HCL 20 MG/ML IJ SOLN: 5.0000 mg | INTRAMUSCULAR | Status: DC | PRN

## 2018-01-16 MED ORDER — METHYLPREDNISOLONE SODIUM SUCC 125 MG IJ SOLR
60.0000 mg | Freq: Three times a day (TID) | INTRAMUSCULAR | Status: DC
  Administered 2018-01-16 – 2018-01-17 (×4): 60 mg via INTRAVENOUS
  Filled 2018-01-16 (×4): qty 2

## 2018-01-16 MED ORDER — ZOLPIDEM TARTRATE 5 MG PO TABS: 5.0000 mg | ORAL_TABLET | Freq: Every evening | ORAL | Status: DC | PRN

## 2018-01-16 MED ORDER — LACTATED RINGERS IV BOLUS: 1000.0000 mL | Freq: Once | INTRAVENOUS | Status: DC

## 2018-01-16 MED ORDER — ACETAMINOPHEN 325 MG PO TABS
650.0000 mg | ORAL_TABLET | Freq: Four times a day (QID) | ORAL | Status: DC | PRN
  Filled 2018-01-16: qty 2

## 2018-01-16 MED ORDER — HEPARIN SODIUM (PORCINE) 5000 UNIT/ML IJ SOLN
5000.0000 [IU] | Freq: Three times a day (TID) | INTRAMUSCULAR | Status: DC
  Administered 2018-01-16 – 2018-01-24 (×23): 5000 [IU] via SUBCUTANEOUS
  Filled 2018-01-16 (×23): qty 1

## 2018-01-16 MED ORDER — ONDANSETRON HCL 4 MG/2ML IJ SOLN: 4.0000 mg | Freq: Three times a day (TID) | INTRAMUSCULAR | Status: DC | PRN

## 2018-01-16 MED ORDER — ALBUTEROL SULFATE (2.5 MG/3ML) 0.083% IN NEBU
2.5000 mg | INHALATION_SOLUTION | RESPIRATORY_TRACT | Status: DC | PRN
  Administered 2018-01-17 – 2018-01-18 (×2): 2.5 mg via RESPIRATORY_TRACT
  Filled 2018-01-16 (×2): qty 3

## 2018-01-16 MED ORDER — VANCOMYCIN HCL 10 G IV SOLR
2000.0000 mg | Freq: Once | INTRAVENOUS | Status: AC
  Administered 2018-01-16: 2000 mg via INTRAVENOUS
  Filled 2018-01-16: qty 2000

## 2018-01-16 MED ORDER — DM-GUAIFENESIN ER 30-600 MG PO TB12
1.0000 | ORAL_TABLET | Freq: Two times a day (BID) | ORAL | Status: DC | PRN
  Administered 2018-01-16 – 2018-01-17 (×2): 1 via ORAL
  Filled 2018-01-16 (×2): qty 1

## 2018-01-16 MED ORDER — SODIUM CHLORIDE 0.9 % IV SOLN
2.0000 g | Freq: Once | INTRAVENOUS | Status: AC
  Administered 2018-01-16: 2 g via INTRAVENOUS
  Filled 2018-01-16: qty 2

## 2018-01-16 MED ORDER — LACTATED RINGERS IV BOLUS: 500.0000 mL | Freq: Once | INTRAVENOUS | Status: DC

## 2018-01-16 MED ORDER — METHYLPREDNISOLONE SODIUM SUCC 125 MG IJ SOLR
125.0000 mg | Freq: Once | INTRAMUSCULAR | Status: AC
  Administered 2018-01-16: 125 mg via INTRAVENOUS
  Filled 2018-01-16: qty 2

## 2018-01-16 NOTE — ED Provider Notes (Addendum)
01/16/2018 5:50 PM   DOB: 1945-04-15 / MRN: [id number]  SUBJECTIVE:  [name] is a 72 y.o. female presenting for chest pain and shortness of breath.  Patient had called her nephrology office where she gets her dialysis and asked for advice and they advised that she go to the emergency room.  She came in here because she felt she would get service faster.  She has had some URI-like symptoms over the last 3 days which have been getting worse.  She does not complain of fever.  She is allergic to lyrica [pregabalin].   She  has a past medical history of Abdominal abscess (12-17-10), Anemia, Arthritis, Blind right eye, CHF (congestive heart failure) ([place]), Chronic lower back pain, Coronary artery disease, Dyspnea, Dysrhythmia, ESRD (end stage renal disease) on dialysis [facility]), Exertional shortness of breath, Eye drainage, Gout, H/O hiatal hernia, Headache(784.0), Heart murmur, History of blood transfusion, Hypertension, Inhalation injury, Myocardial infarction ([place]) (1970's or 1980's), Neuromuscular disorder ([place]), OSA on CPAP, Staph aureus infection (November 2012 ), Swelling of both ankles (12-17-10), and Type II diabetes mellitus ([place]).    She  reports that she quit smoking about 37 years ago. Her smoking use included cigarettes. She has a 4.00 pack-year smoking history. She has never used smokeless tobacco. She reports that she does not drink alcohol or use drugs. She  reports never being sexually active. The patient  has a past surgical history that includes Ventral hernia repair (2012-2013); Cataract extraction w/ intraocular lens implant (Bilateral, 1990's); laparotomy (12/18/2010); Back surgery; Lumbar disc surgery (1970'-80's); Wound debridement (03/20/2011); AV fistula placement (09/30/2011); Bascilic vein transposition (Left, 11/10/2011); Insertion of dialysis catheter (01/05/2012); Bascilic vein transposition (01/28/2012); Appendectomy; Hernia repair; Joint replacement; Total knee arthroplasty  (Left, 1980's); Vaginal hysterectomy (1970's); Dilation and curettage of uterus (1960's); Intraocular prosthesis insertion (Right, 2013); Eye surgery; Cardiac catheterization (6433'I); left heart catheterization with coronary angiogram (N/A, 08/25/2012); Fistula superficialization (Left, 02/18/2017); CORONARY ANGIOGRAPHY (N/A, 04/07/2017); 25 gauge pars plana vitrectomy with 20 gauge mvr port (Left, 05/07/2017); Colonoscopy with propofol (N/A, 08/24/2017); Esophagogastroduodenoscopy (egd) with propofol (N/A, 08/24/2017); and biopsy (08/24/2017).  Her family history includes Cancer in her brother and sister; Hypertension in her father, maternal grandmother, and mother.  Review of Systems  Constitutional: Negative for fever.  Respiratory: Positive for cough and shortness of breath.   Cardiovascular: Positive for chest pain. Negative for leg swelling.  Neurological: Negative for dizziness.    OBJECTIVE:  BP (!) 77/46 (BP Location: Right Arm)   Pulse 78   Temp 98 F (36.7 C) (Oral)   Resp (!) 22   SpO2 98%   Wt Readings from Last 3 Encounters:  10/08/17 272 lb (123.4 kg)  08/24/17 272 lb (123.4 kg)  05/07/17 272 lb (123.4 kg)   Temp Readings from Last 3 Encounters:  01/16/18 98 F (36.7 C) (Oral)  10/08/17 98 F (36.7 C) (Oral)  08/24/17 97.8 F (36.6 C) (Oral)   BP Readings from Last 3 Encounters:  01/16/18 (!) 77/46  10/08/17 106/63  08/24/17 107/69   Pulse Readings from Last 3 Encounters:  01/16/18 78  10/08/17 73  08/24/17 65    Physical Exam Cardiovascular:     Pulses: Normal pulses.     Heart sounds: Normal heart sounds.  Pulmonary:     Effort: Respiratory distress present.     Breath sounds: No stridor. Rhonchi present. No wheezing or rales.     Comments: She speaks in a short sentences so as to catch her breath  between sentences.  She is seen tripoding at times. Chest:     Chest wall: No tenderness.     No results found for this or any previous visit (from the past 72  hour(s)).  No results found.  ASSESSMENT AND PLAN:   Chest pain, unspecified type: Patient clearly not stable from a symptomatic standpoint here.  EKG taken and EMS was dispatched.  Differential includes but not limited to pneumonia, MI, DVT.  EKG showing no overt signs of ischemia and negative for STEMI.  Shortness of breath  Discharge Instructions   None        The patient is advised to call or return to clinic if she does not see an improvement in symptoms, or to seek the care of the closest emergency department if she worsens with the above plan.   Philis Fendt, MHS, PA-C 01/16/2018 5:50 PM   Tereasa Coop, PA-C 01/16/18 1751    Tereasa Coop, PA-C 01/16/18 1755

## 2018-01-16 NOTE — ED Triage Notes (Signed)
Pt here from [facility] with sob and chest pain times 2 day s, pt is a dialysis pt (went today ) pt received albuterol and Atrovent by ems

## 2018-01-16 NOTE — ED Provider Notes (Signed)
[facility] EMERGENCY DEPARTMENT Provider Note   CSN: [id number] Arrival date & time: 01/16/18  1815     History   Chief Complaint Chief Complaint  Patient presents with  . Shortness of Breath    HPI [name] is a 72 y.o. female.  The history is provided by the patient.     Patient is a 72 year old female with a past medical history of ESRD currently on dialysis Tuesday, Thursday, Saturday with no recently missed sessions, HTN, OSA, anemia, and CHF who presents for evaluation of shortness of breath and chest pain that the patient states has been getting progressively worse over the past 2 days.  Patient notes she has also had a nonproductive cough.  She denies any fevers.  Denies any vomiting or diarrhea or dysuria.  Denies any focal extremity weakness, rash, blood in her stool, blood in her urine, headache, earache, sore throat, nasal congestion, or other acute complaints.  Denies prior similar episodes.  Denies any alleviating or aggravating factors.  Past Medical History:  Diagnosis Date  . Abdominal abscess 12-17-10   abdominal abscesses x2 ? one at this time  . Anemia   . Arthritis    "all over" (08/23/2012)  . Blind right eye    fell and crushed socket and eye fell out, was replaced at [facility]  . CHF (congestive heart failure) ([place])   . Chronic lower back pain   . Coronary artery disease    normal coronaries by 08/25/12 cath  . Dyspnea   . Dysrhythmia    irregular, "skips beats"  . ESRD (end stage renal disease) on dialysis ([place])    "started 12/2011; [facility]; TTS" (08/23/2012  . Exertional shortness of breath   . Eye drainage    "lots; since fall 07/2011" (08/23/2012)  . Gout   . H/O hiatal hernia   . Headache(784.0)    "weekly sometimes; since I fell and hit my head in 07/2011 08/23/2012)  . Heart murmur   . History of blood transfusion    "lots before starting dialysis" (08/23/2012)  . Hypertension    sees Dr. Willey Blade  .  Inhalation injury    "worked at [employer]; can't inhale polyurethane or paint, etc" (08/23/2012)  . Myocardial infarction ([place]) 1970's or 1980's  . Neuromuscular disorder ([place])    diabetic neuropathy  . OSA on CPAP   . Staph aureus infection November 2012   . Swelling of both ankles 12-17-10  . Type II diabetes mellitus ([place])    "over 10 years now" (08/23/2012)    Patient Active Problem List   Diagnosis Date Noted  . Chronic diastolic CHF (congestive heart failure) ([place]) 01/16/2018  . Hypotension 01/16/2018  . COPD exacerbation ([place]) 01/16/2018  . Acute bronchitis 01/16/2018  . Heme + stool 08/24/2017  . Iron deficiency anemia, unspecified 08/24/2017  . Chest pain 04/06/2017  . Unstable angina ([place]) 04/02/2017  . Hypoxemia 10/13/2016  . Acute on chronic combined systolic and diastolic CHF (congestive heart failure) ([place]) 10/13/2016  . Anemia due to chronic kidney disease 10/13/2016  . Acute left systolic heart failure ([place]) 01/30/2016  . Encephalopathy, metabolic   . Tremor   . Weakness   . Acute hyperkalemia 12/04/2014  . Neurological deficit present 12/04/2014  . Hypercalcemia 12/04/2014  . OSA on CPAP   . Myocardial infarction ([place])   . Blind right eye   . Coronary artery disease   . CHF (congestive heart failure) ([place])   .  ESRD needing dialysis ([place])   . Hyperkalemia 08/05/2014  . Type II diabetes mellitus with renal manifestations ([place]) 01/18/2014  . Arm pain 02/27/2012  . Other complications due to renal dialysis device, implant, and graft 02/13/2012  . Pain in limb 11/21/2011  . Swollen limb 11/21/2011  . Itching 11/21/2011  . End stage renal disease ([place]) 11/07/2011  . Chronic kidney disease, stage IV (severe) ([place]) 10/24/2011  . Chronic kidney disease (CKD), stage IV (severe) ([place]) 08/29/2011    Past Surgical History:  Procedure Laterality Date  . [place] VITRECTOMY WITH 20 GAUGE MVR PORT Left 05/07/2017   Procedure: 25 GAUGE PARS PLANA VITRECTOMY  WITH ENDOLASER LEFT EYE;  Surgeon: Bernarda Caffey, MD;  Location: [facility];  Service: Ophthalmology;  Laterality: Left;  . APPENDECTOMY    . AV FISTULA PLACEMENT  09/30/2011   Procedure: ARTERIOVENOUS (AV) FISTULA CREATION;  Surgeon: Conrad [place], MD;  Location: [facility];  Service: Vascular;  Laterality: Left;  BRACHIAL-CEPHALIC  . BACK SURGERY    . BASCILIC VEIN TRANSPOSITION Left 11/10/2011   Left arm   . [place] TRANSPOSITION  01/28/2012   Procedure: BASCILIC VEIN TRANSPOSITION;  Surgeon: Conrad [place], MD;  Location: [facility];  Service: Vascular;  Laterality: Left;  Second Stage   . BIOPSY  08/24/2017   Procedure: BIOPSY;  Surgeon: Wilford Corner, MD;  Location: [facility];  Service: Endoscopy;;  . CARDIAC CATHETERIZATION  1980's  . CATARACT EXTRACTION W/ INTRAOCULAR LENS IMPLANT Bilateral 1990's  . COLONOSCOPY WITH PROPOFOL N/A 08/24/2017   Procedure: COLONOSCOPY WITH PROPOFOL;  Surgeon: Wilford Corner, MD;  Location: [facility];  Service: Endoscopy;  Laterality: N/A;  . CORONARY ANGIOGRAPHY N/A 04/07/2017   Procedure: CORONARY ANGIOGRAPHY (CATH LAB);  Surgeon: Dixie Dials, MD;  Location: [facility] CV LAB;  Service: Cardiovascular;  Laterality: N/A;  . DILATION AND CURETTAGE OF UTERUS  1960's  . ESOPHAGOGASTRODUODENOSCOPY (EGD) WITH PROPOFOL N/A 08/24/2017   Procedure: ESOPHAGOGASTRODUODENOSCOPY (EGD) WITH PROPOFOL;  Surgeon: Wilford Corner, MD;  [facility];  Service: Endoscopy;  Laterality: N/A;  . EYE SURGERY    . FISTULA SUPERFICIALIZATION Left 02/18/2017   Procedure: SUPERFICIALIZATION LEFT ARM ARTERIOVENOUS FISTULA;  Surgeon: Serafina Mitchell, MD;  Location: [facility];  Service: Vascular;  Laterality: Left;  . HERNIA REPAIR    . INSERTION OF DIALYSIS CATHETER  01/05/2012   Procedure: INSERTION OF DIALYSIS CATHETER;  Surgeon: Conrad [place], MD;  Location: [facility];  Service: Vascular;  Laterality: N/A;  Right Internal Jugular Placement  . INTRAOCULAR PROSTHESES INSERTION Right  2013   "fell; knocked my eye outl" (08/23/2012)  . JOINT REPLACEMENT    . LAPAROTOMY  12/18/2010   Procedure: EXPLORATORY LAPAROTOMY;  Surgeon: Rolm Bookbinder, MD;  Location: [facility];  Service: General;  Laterality: N/A;  Abdominal Seroma Evacuation  . LEFT HEART CATHETERIZATION WITH CORONARY ANGIOGRAM N/A 08/25/2012   Procedure: LEFT HEART CATHETERIZATION WITH CORONARY ANGIOGRAM;  Surgeon: Birdie Riddle, MD;  Location: [facility] CATH LAB;  Service: Cardiovascular;  Laterality: N/A;  . LUMBAR DISC SURGERY  1970'-80's   X 3  . TOTAL KNEE ARTHROPLASTY Left 1980's  . VAGINAL HYSTERECTOMY  1970's  . VENTRAL HERNIA REPAIR  2012-2013   component separation, repair with biologic; "had 3 surgeries to fix it" (08/23/2012)  . WOUND DEBRIDEMENT  03/20/2011   Procedure: DEBRIDEMENT ABDOMINAL WOUND;  Surgeon: Rolm Bookbinder, MD;  Location: [facility];  Service: General;  Laterality: N/A;  debridement abdominal wall, placement of wound  vac     OB History   No obstetric history on file.      Home Medications    Prior to Admission medications   Medication Sig Start Date End Date Taking? Authorizing Provider  albuterol (PROVENTIL) (2.5 MG/3ML) 0.083% nebulizer solution Take 3 mLs (2.5 mg total) by nebulization every 2 (two) hours as needed for shortness of breath. 10/21/16   Elwin Mocha, MD  aspirin EC 81 MG tablet Take 81 mg by mouth daily.      [provider]  atorvastatin (LIPITOR) 40 MG tablet Take 40 mg by mouth daily.  04/26/17   [provider]  carvedilol (COREG) 25 MG tablet Take 0.5 tablets (12.5 mg total) by mouth 2 (two) times daily. Patient taking differently: Take 25 mg by mouth 2 (two) times daily.  04/09/17   Dixie Dials, MD  cinacalcet (SENSIPAR) 90 MG tablet Take 90 mg by mouth daily.     [provider]  diphenhydrAMINE (BENADRYL) 25 mg capsule Take 50 mg by mouth See admin instructions. Take 2 tablets (50 mg) by mouth prior to dialysis on Tuesday, Thursday,  Saturday - for itching    [provider]  ethyl chloride spray Apply 1 application topically See admin instructions. Apply topically before dialysis on Tuesday, Thursday, Saturday 01/10/16   [provider]  insulin glargine (LANTUS) 100 UNIT/ML injection Inject 0.05 mLs (5 Units total) into the skin at bedtime. Patient taking differently: Inject 6 Units into the skin at bedtime.  04/09/17   Dixie Dials, MD  midodrine (PROAMATINE) 10 MG tablet Take 10 mg by mouth See admin instructions. Take 10mg  on Tues, Thurs, and Saturday before dialysis 04/26/17   [provider]  Multiple Vitamin (MULTIVITAMIN WITH MINERALS) TABS tablet Take 1 tablet by mouth daily.    [provider]  oxyCODONE-acetaminophen (PERCOCET/ROXICET) 5-325 MG tablet Take 1 tablet by mouth 4 (four) times daily as needed for pain. 08/03/17   [provider]  sevelamer carbonate (RENVELA) 800 MG tablet Take 2-3 tablets (1,600-2,400 mg total) by mouth See admin instructions. Take 3 tablets (2,400 mg) by mouth 3 times daily with meals. Take 2 tablets (1,600 mg) by mouth with snacks. Receives supply from dialysis center. Patient taking differently: Take 1,600-2,400 mg by mouth See admin instructions. Pt takes 4 tablets after every meal 04/09/17   Dixie Dials, MD  tiotropium (SPIRIVA) 18 MCG inhalation capsule Place 1 capsule (18 mcg total) into inhaler and inhale daily. Patient taking differently: Place 18 mcg into inhaler and inhale daily as needed (shortness of breath).  10/21/16   Elwin Mocha, MD    Family History Family History  Problem Relation Age of Onset  . Hypertension Mother   . Cancer Brother        spine  . Hypertension Father   . Cancer Sister        brain  . Hypertension Maternal Grandmother   . Anesthesia problems Neg Hx     Social History Social History   Tobacco Use  . Smoking status: Former Smoker    Packs/day: 0.25    Years: 16.00    Pack years: 4.00     Types: Cigarettes    Last attempt to quit: 12/16/1980    Years since quitting: 37.1  . Smokeless tobacco: Never Used  Substance Use Topics  . Alcohol use: No  . Drug use: No     Allergies   Lyrica [pregabalin]   Review of Systems Review of  Systems  Constitutional: Negative for chills and fever.  HENT: Negative for ear pain and sore throat.   Eyes: Negative for pain and visual disturbance.  Respiratory: Negative for cough and shortness of breath.   Cardiovascular: Negative for chest pain and palpitations.  Gastrointestinal: Negative for abdominal pain and vomiting.  Genitourinary: Negative for dysuria and hematuria.  Musculoskeletal: Negative for arthralgias and back pain.  Skin: Negative for color change and rash.  Neurological: Negative for seizures and syncope.  All other systems reviewed and are negative.    Physical Exam Updated Vital Signs BP (!) 105/59 (BP Location: Right Arm)   Pulse 70   Temp 98.8 F (37.1 C) (Oral)   Resp 17   Ht 5\' 6"  (1.676 m)   Wt 125.9 kg   SpO2 95%   BMI 44.81 kg/m   Physical Exam Vitals signs and nursing note reviewed.  Constitutional:      General: She is not in acute distress.    Appearance: She is well-developed.  HENT:     Head: Normocephalic and atraumatic.  Eyes:     Extraocular Movements: Extraocular movements intact.     Conjunctiva/sclera: Conjunctivae normal.     Pupils: Pupils are equal, round, and reactive to light.  Neck:     Musculoskeletal: Neck supple.  Cardiovascular:     Rate and Rhythm: Regular rhythm. Tachycardia present.     Pulses: Normal pulses.     Heart sounds: No murmur.  Pulmonary:     Effort: Pulmonary effort is normal. No respiratory distress.     Breath sounds: Examination of the right-lower field reveals rhonchi. Wheezing and rhonchi present.  Abdominal:     Palpations: Abdomen is soft.     Tenderness: There is no abdominal tenderness.  Skin:    General: Skin is warm and dry.    Neurological:     Mental Status: She is alert.      ED Treatments / Results  Labs (all labs ordered are listed, but only abnormal results are displayed) Labs Reviewed  BASIC METABOLIC PANEL - Abnormal; Notable for the following components:      Result Value   Chloride 97 (*)    Glucose, Bld 106 (*)    BUN 29 (*)    Creatinine, Ser 6.97 (*)    GFR calc non Af Amer 5 (*)    GFR calc Af Amer 6 (*)    Anion gap 16 (*)    All other components within normal limits  CBC WITH DIFFERENTIAL/PLATELET - Abnormal; Notable for the following components:   RBC 3.80 (*)    Hemoglobin 10.9 (*)    HCT 35.8 (*)    RDW 16.4 (*)    Eosinophils Absolute 0.7 (*)    All other components within normal limits  BRAIN NATRIURETIC PEPTIDE - Abnormal; Notable for the following components:   B Natriuretic Peptide 118.7 (*)    All other components within normal limits  GLUCOSE, CAPILLARY - Abnormal; Notable for the following components:   Glucose-Capillary 147 (*)    All other components within normal limits  I-STAT CHEM 8, ED - Abnormal; Notable for the following components:   BUN 34 (*)    Creatinine, Ser 7.00 (*)    Glucose, Bld 102 (*)    Calcium, Ion 1.04 (*)    TCO2 35 (*)    All other components within normal limits  I-STAT VENOUS BLOOD GAS, ED - Abnormal; Notable for the following components:   Bicarbonate 33.6 (*)  TCO2 35 (*)    Acid-Base Excess 6.0 (*)    All other components within normal limits  RESPIRATORY PANEL BY PCR  CULTURE, BLOOD (ROUTINE X 2)  CULTURE, BLOOD (ROUTINE X 2)  EXPECTORATED SPUTUM ASSESSMENT W REFEX TO RESP CULTURE  PROTIME-INR  BLOOD GAS, VENOUS  HIV ANTIBODY (ROUTINE TESTING W REFLEX)  I-STAT TROPONIN, ED  I-STAT CG4 LACTIC ACID, ED  I-STAT CG4 LACTIC ACID, ED    EKG EKG Interpretation  Date/Time:  Saturday January 16 2018 18:22:54 EST Ventricular Rate:  74 PR Interval:    QRS Duration: 95 QT Interval:  409 QTC Calculation: 454 R  Axis:   -42 Text Interpretation:  Sinus rhythm Supraventricular bigeminy Left axis deviation Abnormal R-wave progression, late transition No significant change since last tracing Confirmed by Wandra Arthurs ([id number]) on 01/16/2018 6:25:05 PM   Radiology Dg Chest Port 1 View  Result Date: 01/16/2018 CLINICAL DATA:  Fever x1 day. SOB, cough x3 days. Pt states sometimes the cough is productive and other times it is dry. Hx of HTN, DM, CHF. Hx of ESRD, MI. Ex-smoker, quit APPROX.40 years ago. EXAM: PORTABLE CHEST 1 VIEW COMPARISON:  04/02/2017 FINDINGS: The heart is enlarged. There is atherosclerotic calcification of the thoracic aorta. No focal consolidation. No pulmonary edema. Interval removal of dialysis catheter. IMPRESSION: Cardiomegaly. Electronically Signed   By: Nolon Nations M.D.   On: 01/16/2018 19:53    Procedures Procedures (including critical care time)  Medications Ordered in ED Medications  ipratropium-albuterol (DUONEB) 0.5-2.5 (3) MG/3ML nebulizer solution 3 mL (3 mLs Nebulization Given 01/16/18 2300)  methylPREDNISolone sodium succinate (SOLU-MEDROL) 125 mg/2 mL injection 60 mg (60 mg Intravenous Given 01/16/18 2334)  midodrine (PROAMATINE) tablet 10 mg (10 mg Oral Given 01/16/18 2333)  albuterol (PROVENTIL) (2.5 MG/3ML) 0.083% nebulizer solution 2.5 mg (has no administration in time range)  dextromethorphan-guaiFENesin (MUCINEX DM) 30-600 MG per 12 hr tablet 1 tablet (1 tablet Oral Given 01/16/18 2333)  insulin aspart (novoLOG) injection 0-9 Units (has no administration in time range)  azithromycin (ZITHROMAX) tablet 500 mg (has no administration in time range)    Followed by  azithromycin (ZITHROMAX) tablet 250 mg (has no administration in time range)  heparin injection 5,000 Units (5,000 Units Subcutaneous Given 01/16/18 2334)  ondansetron (ZOFRAN) injection 4 mg (has no administration in time range)  hydrALAZINE (APRESOLINE) injection 5 mg (has no administration in time  range)  acetaminophen (TYLENOL) tablet 650 mg (has no administration in time range)  zolpidem (AMBIEN) tablet 5 mg (has no administration in time range)  lactated ringers bolus 500 mL (0 mLs Intravenous Stopped 01/16/18 1948)  methylPREDNISolone sodium succinate (SOLU-MEDROL) 125 mg/2 mL injection 125 mg (125 mg Intravenous Given 01/16/18 1835)  lactated ringers bolus 500 mL (0 mLs Intravenous Stopped 01/16/18 2056)  ceFEPIme (MAXIPIME) 2 g in sodium chloride 0.9 % 100 mL IVPB (0 g Intravenous Stopped 01/16/18 2056)  vancomycin (VANCOCIN) 2,000 mg in sodium chloride 0.9 % 500 mL IVPB (2,000 mg Intravenous New Bag/Given 01/16/18 2055)     Initial Impression / Assessment and Plan / ED Course  I have reviewed the triage vital signs and the nursing notes.  Pertinent labs & imaging results that were available during my care of the patient were reviewed by me and considered in my medical decision making (see chart for details).     Patient is a 72 year old female who presents above-stated history exam.  On presentation patient is noted to be tachypneic with stable vital signs.  Exam as above remarkable for bilateral wheezes as well as rhonchi in the right lower lobe.  Patient otherwise has intact extremity pulses and abdomen is soft and nontender.  I have low suspicion for ACS given patient denies any chest pain, and ECG shows no significant change from last tracing with some left axis deviation, normal intervals, no signs of acute ischemic change.  In addition the troponin is nonelevated.  Chest x-ray was obtained that showed concerning findings for consolidation the right lower lobe.  No Flenniken jested to suggest acute heart failure and BNP is 118.  BMP appears stable compared to prior levels.  I have low suspicion for symptomatic anemia given hemoglobin of 10.9 appears consistent with prior levels over the last several months.  No other significant lymphocytosis and platelets are 183.  Patient given  IV fluids and IV antibiotics emergency department.  Patient has pneumonia of right lower lobe.  Patient admitted in stable condition.  Final Clinical Impressions(s) / ED Diagnoses   Final diagnoses:  Dyspnea, unspecified type    ED Discharge Orders    None       Hulan Saas, MD 01/17/18 Alena Bills    Drenda Freeze, MD 01/19/18 [phone]

## 2018-01-16 NOTE — H&P (Signed)
History and Physical    [name] UXN:[id number] DOB: August 12, 1945 DOA: 01/16/2018  Referring MD/NP/PA:   PCP: [name], MD   Patient coming from:  The patient is coming from home.  At baseline, pt is independent for most of ADL.        Chief Complaint:cough and SOB  HPI: [name] is a 72 y.o. female with medical history significant of ESRD-HD (TTS), hypertension, hyperlipidemia, diabetes mellitus, gout, right eye blindness, dCHF, CAD, OSA on CPAP, former smoker (smoked >20 years), who presents with cough and shortness breath.  Patient states that she has been having shortness of breath in the past 2 days, which has been progressively getting worse.  She coughs up white and green-colored sputum.  No fever or chills.  Patient states that she does not have chest pain at rest, but coughing induced some chest pain.  Patient denies nausea, vomiting, diarrhea, abdominal pain, symptoms of UTI or unilateral weakness.  No sick contact.  No unilateral weakness.  Patient had full course of dialysis today.  Initially patient was hypotensive with blood pressure 77/46, which improved to 91/40 after treated with 500 cc Ringer's solution x2 in [place].  ED Course: pt was found to have WBC 10.1, negative troponin, lactic acid 1.26, INR 1.13, potassium 4.1, bicarbonate 28, BUN 34, creatinine 7.00.  Chest x-ray showed cardiomegaly and vascular congestion without infiltration.  Patient is placed on telemetry bed of observation.  Review of Systems:   General: no fevers, chills, no body weight gain, has fatigue HEENT: no blurry vision, hearing changes or sore throat Respiratory: has dyspnea, coughing, wheezing CV: has chest pain, no palpitation GI: no nausea, vomiting, abdominal pain, diarrhea, constipation GU: no dysuria, burning on urination, increased urinary frequency, hematuria  Ext: has mild leg edema Neuro: no unilateral weakness, numbness, or tingling, no vision change or hearing  loss Skin: no rash, no skin tear. MSK: No muscle spasm, no deformity, no limitation of range of movement in spin Heme: No easy bruising.  Travel history: No recent long distant travel.  Allergy:  Allergies  Allergen Reactions  . Lyrica [Pregabalin] Other (See Comments)    Hallucinations    Past Medical History:  Diagnosis Date  . Abdominal abscess 12-17-10   abdominal abscesses x2 ? one at this time  . Anemia   . Arthritis    "all over" (08/23/2012)  . Blind right eye    fell and crushed socket and eye fell out, was replaced at [facility]  . CHF (congestive heart failure) ([place])   . Chronic lower back pain   . Coronary artery disease    normal coronaries by 08/25/12 cath  . Dyspnea   . Dysrhythmia    irregular, "skips beats"  . ESRD (end stage renal disease) on dialysis ([place])    "started 12/2011; [place]; TTS" (08/23/2012  . Exertional shortness of breath   . Eye drainage    "lots; since fall 07/2011" (08/23/2012)  . Gout   . H/O hiatal hernia   . Headache(784.0)    "weekly sometimes; since I fell and hit my head in 07/2011 08/23/2012)  . Heart murmur   . History of blood transfusion    "lots before starting dialysis" (08/23/2012)  . Hypertension    sees Dr. Willey Blade  . Inhalation injury    "worked at [employer]; can't inhale polyurethane or paint, etc" (08/23/2012)  . Myocardial infarction ([place]) 1970's or 1980's  . Neuromuscular disorder ([place])    diabetic  neuropathy  . OSA on CPAP   . Staph aureus infection November 2012   . Swelling of both ankles 12-17-10  . Type II diabetes mellitus ([place])    "over 10 years now" (08/23/2012)    Past Surgical History:  Procedure Laterality Date  . [place] VITRECTOMY WITH 20 GAUGE MVR PORT Left 05/07/2017   Procedure: 25 GAUGE PARS PLANA VITRECTOMY WITH ENDOLASER LEFT EYE;  Surgeon: Bernarda Caffey, MD;  Location: [facility];  Service: Ophthalmology;  Laterality: Left;  . APPENDECTOMY    . AV FISTULA PLACEMENT  09/30/2011    Procedure: ARTERIOVENOUS (AV) FISTULA CREATION;  Surgeon: Conrad [place], MD;  Location: [facility];  Service: Vascular;  Laterality: Left;  BRACHIAL-CEPHALIC  . BACK SURGERY    . BASCILIC VEIN TRANSPOSITION Left 11/10/2011   Left arm   . [place] TRANSPOSITION  01/28/2012   Procedure: BASCILIC VEIN TRANSPOSITION;  Surgeon: Conrad [place], MD;  Location: [facility];  Service: Vascular;  Laterality: Left;  Second Stage   . BIOPSY  08/24/2017   Procedure: BIOPSY;  Surgeon: Wilford Corner, MD;  Location: [facility];  Service: Endoscopy;;  . CARDIAC CATHETERIZATION  1980's  . CATARACT EXTRACTION W/ INTRAOCULAR LENS IMPLANT Bilateral 1990's  . COLONOSCOPY WITH PROPOFOL N/A 08/24/2017   Procedure: COLONOSCOPY WITH PROPOFOL;  Surgeon: Wilford Corner, MD;  [facility];  Service: Endoscopy;  Laterality: N/A;  . CORONARY ANGIOGRAPHY N/A 04/07/2017   Procedure: CORONARY ANGIOGRAPHY (CATH LAB);  Surgeon: Dixie Dials, MD;  Location: [facility] CV [facility];  Service: Cardiovascular;  Laterality: N/A;  . DILATION AND CURETTAGE OF UTERUS  1960's  . ESOPHAGOGASTRODUODENOSCOPY (EGD) WITH PROPOFOL N/A 08/24/2017   Procedure: ESOPHAGOGASTRODUODENOSCOPY (EGD) WITH PROPOFOL;  Surgeon: Wilford Corner, MD;  [facility];  Service: Endoscopy;  Laterality: N/A;  . EYE SURGERY    . FISTULA SUPERFICIALIZATION Left 02/18/2017   Procedure: SUPERFICIALIZATION LEFT ARM ARTERIOVENOUS FISTULA;  Surgeon: Serafina Mitchell, MD;  Location: [facility];  Service: Vascular;  Laterality: Left;  . HERNIA REPAIR    . INSERTION OF DIALYSIS CATHETER  01/05/2012   Procedure: INSERTION OF DIALYSIS CATHETER;  Surgeon: Conrad [place], MD;  Location: [facility];  Service: Vascular;  Laterality: N/A;  Right Internal Jugular Placement  . INTRAOCULAR PROSTHESES INSERTION Right 2013   "fell; knocked my eye outl" (08/23/2012)  . JOINT REPLACEMENT    . LAPAROTOMY  12/18/2010   Procedure: EXPLORATORY LAPAROTOMY;  Surgeon: Rolm Bookbinder, MD;   Location: [facility];  Service: General;  Laterality: N/A;  Abdominal Seroma Evacuation  . LEFT HEART CATHETERIZATION WITH CORONARY ANGIOGRAM N/A 08/25/2012   Procedure: LEFT HEART CATHETERIZATION WITH CORONARY ANGIOGRAM;  Surgeon: Birdie Riddle, MD;  Location: [facility];  Service: Cardiovascular;  Laterality: N/A;  . LUMBAR DISC SURGERY  1970'-80's   X 3  . TOTAL KNEE ARTHROPLASTY Left 1980's  . VAGINAL HYSTERECTOMY  1970's  . VENTRAL HERNIA REPAIR  2012-2013   component separation, repair with biologic; "had 3 surgeries to fix it" (08/23/2012)  . WOUND DEBRIDEMENT  03/20/2011   Procedure: DEBRIDEMENT ABDOMINAL WOUND;  Surgeon: Rolm Bookbinder, MD;  Location: [facility];  Service: General;  Laterality: N/A;  debridement abdominal wall, placement of wound vac    Social History:  reports that she quit smoking about 37 years ago. Her smoking use included cigarettes. She has a 4.00 pack-year smoking history. She has never used smokeless tobacco. She reports that she does not drink alcohol or use drugs.  Family History:  Family History  Problem Relation Age of Onset  . Hypertension Mother   . Cancer Brother        spine  . Hypertension Father   . Cancer Sister        brain  . Hypertension Maternal Grandmother   . Anesthesia problems Neg Hx      Prior to Admission medications   Medication Sig Start Date End Date Taking? Authorizing Provider  albuterol (PROVENTIL) (2.5 MG/3ML) 0.083% nebulizer solution Take 3 mLs (2.5 mg total) by nebulization every 2 (two) hours as needed for shortness of breath. 10/21/16   Elwin Mocha, MD  aspirin EC 81 MG tablet Take 81 mg by mouth daily.      [provider]  atorvastatin (LIPITOR) 40 MG tablet Take 40 mg by mouth daily.  04/26/17   [provider]  carvedilol (COREG) 25 MG tablet Take 0.5 tablets (12.5 mg total) by mouth 2 (two) times daily. Patient taking differently: Take 25 mg by mouth 2 (two) times daily.  04/09/17   Dixie Dials, MD    cinacalcet (SENSIPAR) 90 MG tablet Take 90 mg by mouth daily.     [provider]  diphenhydrAMINE (BENADRYL) 25 mg capsule Take 50 mg by mouth See admin instructions. Take 2 tablets (50 mg) by mouth prior to dialysis on Tuesday, Thursday, Saturday - for itching    [provider]  ethyl chloride spray Apply 1 application topically See admin instructions. Apply topically before dialysis on Tuesday, Thursday, Saturday 01/10/16   [provider]  insulin glargine (LANTUS) 100 UNIT/ML injection Inject 0.05 mLs (5 Units total) into the skin at bedtime. Patient taking differently: Inject 6 Units into the skin at bedtime.  04/09/17   Dixie Dials, MD  midodrine (PROAMATINE) 10 MG tablet Take 10 mg by mouth See admin instructions. Take 10mg  on Tues, Thurs, and Saturday before dialysis 04/26/17   [provider]  Multiple Vitamin (MULTIVITAMIN WITH MINERALS) TABS tablet Take 1 tablet by mouth daily.    [provider]  oxyCODONE-acetaminophen (PERCOCET/ROXICET) 5-325 MG tablet Take 1 tablet by mouth 4 (four) times daily as needed for pain. 08/03/17   [provider]  sevelamer carbonate (RENVELA) 800 MG tablet Take 2-3 tablets (1,600-2,400 mg total) by mouth See admin instructions. Take 3 tablets (2,400 mg) by mouth 3 times daily with meals. Take 2 tablets (1,600 mg) by mouth with snacks. Receives supply from dialysis center. Patient taking differently: Take 1,600-2,400 mg by mouth See admin instructions. Pt takes 4 tablets after every meal 04/09/17   Dixie Dials, MD  tiotropium (SPIRIVA) 18 MCG inhalation capsule Place 1 capsule (18 mcg total) into inhaler and inhale daily. Patient taking differently: Place 18 mcg into inhaler and inhale daily as needed (shortness of breath).  10/21/16   Elwin Mocha, MD    Physical Exam: Vitals:   01/16/18 2210 01/16/18 2300 01/17/18 0327 01/17/18 0417  BP: (!) 105/59   (!) 108/54  Pulse: 70   78  Resp: 17   (!)  23  Temp: 98.8 F (37.1 C)   97.7 F (36.5 C)  TempSrc: Oral   Oral  SpO2: 93% 95% 94% 97%  Weight: 125.9 kg   125.6 kg  Height:       General: Not in acute distress HEENT:       Eyes: left eye PERRL, EOMI, no scleral icterus.       ENT: No discharge from the ears and  nose, no pharynx injection, no tonsillar enlargement.        Neck: No JVD, no bruit, no mass felt. Heme: No neck lymph node enlargement. Cardiac: S1/S2, RRR, No murmurs, No gallops or rubs. Respiratory: Diffuse wheezing and rhonchi bilaterally. GI: Soft, nondistended, nontender, no rebound pain, no organomegaly, BS present. GU: No hematuria Ext: trace leg edema bilaterally. 2+DP/PT pulse bilaterally. Musculoskeletal: No joint deformities, No joint redness or warmth, no limitation of ROM in spin. Skin: No rashes.  Neuro: Alert, oriented X3, cranial nerves II-XII grossly intact, moves all extremities normally.  Psych: Patient is not psychotic, no suicidal or hemocidal ideation.  Labs on Admission: I have personally reviewed following labs and imaging studies  CBC: Recent Labs  Lab 01/16/18 1822 01/16/18 1846  WBC 10.1  --   NEUTROABS 5.8  --   HGB 10.9* 12.2  HCT 35.8* 36.0  MCV 94.2  --   PLT 183  --    Basic Metabolic Panel: Recent Labs  Lab 01/16/18 1822 01/16/18 1846  NA 141 141  K 4.2 4.1  CL 97* 99  CO2 28  --   GLUCOSE 106* 102*  BUN 29* 34*  CREATININE 6.97* 7.00*  CALCIUM 8.9  --    GFR: Estimated Creatinine Clearance: 9.7 mL/min (A) (by C-G formula based on SCr of 7 mg/dL (H)). Liver Function Tests: No results for input(s): AST, ALT, ALKPHOS, BILITOT, PROT, ALBUMIN in the last 168 hours. No results for input(s): LIPASE, AMYLASE in the last 168 hours. No results for input(s): AMMONIA in the last 168 hours. Coagulation Profile: Recent Labs  Lab 01/16/18 1822  INR 1.13   Cardiac Enzymes: No results for input(s): CKTOTAL, CKMB, CKMBINDEX, TROPONINI in the last 168 hours. BNP (last  3 results) No results for input(s): PROBNP in the last 8760 hours. HbA1C: No results for input(s): HGBA1C in the last 72 hours. CBG: Recent Labs  Lab 01/16/18 2214  GLUCAP 147*   Lipid Profile: No results for input(s): CHOL, HDL, LDLCALC, TRIG, CHOLHDL, LDLDIRECT in the last 72 hours. Thyroid Function Tests: No results for input(s): TSH, T4TOTAL, FREET4, T3FREE, THYROIDAB in the last 72 hours. Anemia Panel: No results for input(s): VITAMINB12, FOLATE, FERRITIN, TIBC, IRON, RETICCTPCT in the last 72 hours. Urine analysis:    Component Value Date/Time   COLORURINE YELLOW 07/02/2010 2127   APPEARANCEUR CLOUDY (A) 07/02/2010 2127   LABSPEC 1.011 07/02/2010 2127   PHURINE 6.5 07/02/2010 2127   GLUCOSEU 250 (A) 07/02/2010 2127   HGBUR SMALL (A) 07/02/2010 2127   BILIRUBINUR NEGATIVE 07/02/2010 2127   KETONESUR NEGATIVE 07/02/2010 2127   PROTEINUR >300 (A) 07/02/2010 2127   UROBILINOGEN 0.2 07/02/2010 2127   NITRITE NEGATIVE 07/02/2010 2127   LEUKOCYTESUR LARGE (A) 07/02/2010 2127   Sepsis Labs: @LABRCNTIP (procalcitonin:4,lacticidven:4) )No results found for this or any previous visit (from the past 240 hour(s)).   Radiological Exams on Admission: Dg Chest Port 1 View  Result Date: 01/16/2018 CLINICAL DATA:  Fever x1 day. SOB, cough x3 days. Pt states sometimes the cough is productive and other times it is dry. Hx of HTN, DM, CHF. Hx of ESRD, MI. Ex-smoker, quit APPROX.40 years ago. EXAM: PORTABLE CHEST 1 VIEW COMPARISON:  04/02/2017 FINDINGS: The heart is enlarged. There is atherosclerotic calcification of the thoracic aorta. No focal consolidation. No pulmonary edema. Interval removal of dialysis catheter. IMPRESSION: Cardiomegaly. Electronically Signed   By: Nolon Nations M.D.   On: 01/16/2018 19:53     EKG: Independently reviewed.  Sinus  rhythm, QTC 491, PVC, low voltage, LAD.  Assessment/Plan Principal Problem:   COPD exacerbation (HCC) Active Problems:   OSA on  CPAP   Coronary artery disease   ESRD needing dialysis ([place])   Type II diabetes mellitus with renal manifestations (HCC)   Chest pain   Chronic diastolic CHF (congestive heart failure) (HCC)   Hypotension   Acute bronchitis   Essential hypertension   HLD (hyperlipidemia)   COPD exacerbation vs. bronchitis: Patient smoked 20 years before quitting smoking.  She may have undiagnosed COPD.  Patient has productive cough, shortness of breath, diffused wheezing on auscultation, consistent with COPD exacerbation.  Chest x-ray is negative for infiltration.  -will place on tele bed for obs -Nebulizers: scheduled Duoneb and prn albuterol -Solu-Medrol 60 mg IV tid -Z pak (patient received 1 dose of cefepime and vancomycin in the ED due to concerning for pneumonia) -Mucinex for cough  -Incentive spirometry -Follow up blood culture x2, sputum culture, respiratory virus panel -Nasal cannula oxygen as needed to maintain O2 saturation 92% or greater  CAD and CP: Patient has some pleuritic chest pain, which is likely due to coughing.  Troponin negative.  Low suspicions for ACS. - Continue aspirin, Lipitor  HTN: -hold Coreg due hypotension -IV hydralazine as needed if blood pressure is elevated  Hyperlipidemia: -Lipitor  OSA - on CPAP  ESRD needing dialysis (TTS): potassium 4.1, bicarbonate 28, BUN 34, creatinine 7.00. pt's next scheduled dialysis is on Monday due to holiday -Please call renal for dialysis if patient is still in hospital on Monday  Type II diabetes mellitus with renal manifestations ([place]): Last A1c 5.6 on 04/02/17, controled. Patient is taking Lantus at home -will decrease Lantus dose from 6-4 units daily -SSI  Chronic diastolic CHF (congestive heart failure) ([place]): 2D echo on 04/06/2017 showed EF CT deficit 5% with grade 1 diastolic dysfunction.  Patient has some mild leg edema, no pulmonary edema on chest x-ray.  No JVD.  CHF seems to be compensated per - Volume management  per renal, dialysis.  Hypotension: Blood pressure 77/46, which improved to 91/49 after treated with 500 cc Ringer's solution x2.  Most likely due to dialysis removal of fluid.  Patient does not have fever or leukocytosis, less likely due to sepsis. - Continue Midodrine10 mg 3 times daily  DVT ppx: SQ Heparin      Code Status: Full code Family Communication:    Yes, patient's daughter   at bed side Disposition Plan:  Anticipate discharge back to previous home environment Consults called: None Admission status: Obs / tele   Date of Service 01/17/2018    Ivor Costa [facility] Pager [phone]  If 7PM-7AM, please contact night-coverage [email or web address] Password [facility] 01/17/2018, 5:24 AM

## 2018-01-16 NOTE — ED Notes (Signed)
Called [facility] truck [facility] notified for transport

## 2018-01-16 NOTE — ED Triage Notes (Signed)
The patient presented to the [facility] with a complaint of a cough x 3 days.

## 2018-01-16 NOTE — Progress Notes (Signed)
Pharmacy Antibiotic Note  [name] is a 72 y.o. female admitted on 01/16/2018 with pneumonia.  Pharmacy has been consulted for vancomycin dosing.  Presenting with cough for 3 days with respiratory distress. Known ESRD patient (last session today). WBC WNL. LA 1.26. Afebrile. Received cefepime 2 g IV and methylprednisolone.   Plan: Vancomycin 2 g IV once  Will monitor HD schedule to determine timing for 1 g IV post-HD Monitor cx results, clinical pic, and LOT  Height: 5\' 6"  (167.6 cm) Weight: 272 lb (123.4 kg) IBW/kg (Calculated) : 59.3  Temp (24hrs), Avg:98 F (36.7 C), Min:98 F (36.7 C), Max:98 F (36.7 C)  Recent Labs  Lab 01/16/18 1822 01/16/18 1846 01/16/18 1847  WBC 10.1  --   --   CREATININE 6.97* 7.00*  --   LATICACIDVEN  --   --  1.26    Estimated Creatinine Clearance: 9.6 mL/min (A) (by C-G formula based on SCr of 7 mg/dL (H)).    Allergies  Allergen Reactions  . Lyrica [Pregabalin] Other (See Comments)    Hallucinations    Antimicrobials this admission: Vancomycin 12/28 >>  Cefepime 12/28 >>   Dose adjustments this admission: N/A  Microbiology results: 12/28 BCx: sent 12/28 Resp PCR: sent   Thank you for allowing pharmacy to be a part of this patient's care.  Antonietta Jewel, PharmD, [place] Clinical Pharmacist  Pager: [phone] Phone: [phone] 01/16/2018 7:58 PM

## 2018-01-17 DIAGNOSIS — E785 Hyperlipidemia, unspecified: Secondary | ICD-10-CM | POA: Diagnosis present

## 2018-01-17 DIAGNOSIS — I10 Essential (primary) hypertension: Secondary | ICD-10-CM | POA: Diagnosis present

## 2018-01-17 LAB — RESPIRATORY PANEL BY PCR
ADENOVIRUS-RVPPCR: NOT DETECTED
Bordetella pertussis: NOT DETECTED
CORONAVIRUS 229E-RVPPCR: NOT DETECTED
CORONAVIRUS HKU1-RVPPCR: NOT DETECTED
CORONAVIRUS OC43-RVPPCR: NOT DETECTED
Chlamydophila pneumoniae: NOT DETECTED
Coronavirus NL63: NOT DETECTED
Influenza A: NOT DETECTED
Influenza B: NOT DETECTED
MYCOPLASMA PNEUMONIAE-RVPPCR: NOT DETECTED
Metapneumovirus: NOT DETECTED
PARAINFLUENZA VIRUS 4-RVPPCR: NOT DETECTED
Parainfluenza Virus 1: NOT DETECTED
Parainfluenza Virus 2: NOT DETECTED
Parainfluenza Virus 3: NOT DETECTED
Respiratory Syncytial Virus: NOT DETECTED
Rhinovirus / Enterovirus: DETECTED — AB

## 2018-01-17 LAB — EXPECTORATED SPUTUM ASSESSMENT W GRAM STAIN, RFLX TO RESP C: Special Requests: NORMAL

## 2018-01-17 LAB — EXPECTORATED SPUTUM ASSESSMENT W REFEX TO RESP CULTURE

## 2018-01-17 LAB — INFLUENZA PANEL BY PCR (TYPE A & B)
Influenza A By PCR: NEGATIVE
Influenza B By PCR: NEGATIVE

## 2018-01-17 LAB — GLUCOSE, CAPILLARY
Glucose-Capillary: 166 mg/dL — ABNORMAL HIGH (ref 70–99)
Glucose-Capillary: 157 mg/dL — ABNORMAL HIGH (ref 70–99)
Glucose-Capillary: 182 mg/dL — ABNORMAL HIGH (ref 70–99)
Glucose-Capillary: 255 mg/dL — ABNORMAL HIGH (ref 70–99)

## 2018-01-17 LAB — MRSA PCR SCREENING: MRSA by PCR: NEGATIVE

## 2018-01-17 MED ORDER — UMECLIDINIUM BROMIDE 62.5 MCG/INH IN AEPB: 1.0000 | INHALATION_SPRAY | Freq: Every day | RESPIRATORY_TRACT | Status: DC | PRN

## 2018-01-17 MED ORDER — ASPIRIN EC 81 MG PO TBEC
81.0000 mg | DELAYED_RELEASE_TABLET | Freq: Every day | ORAL | Status: DC
  Administered 2018-01-17 – 2018-01-24 (×8): 81 mg via ORAL
  Filled 2018-01-17 (×8): qty 1

## 2018-01-17 MED ORDER — PENTAFLUOROPROP-TETRAFLUOROETH EX AERO: 1.0000 "application " | INHALATION_SPRAY | CUTANEOUS | Status: DC

## 2018-01-17 MED ORDER — IPRATROPIUM-ALBUTEROL 0.5-2.5 (3) MG/3ML IN SOLN
3.0000 mL | Freq: Two times a day (BID) | RESPIRATORY_TRACT | Status: DC
  Administered 2018-01-17 – 2018-01-18 (×2): 3 mL via RESPIRATORY_TRACT
  Filled 2018-01-17 (×2): qty 3

## 2018-01-17 MED ORDER — ADULT MULTIVITAMIN W/MINERALS CH
1.0000 | ORAL_TABLET | Freq: Every day | ORAL | Status: DC
  Administered 2018-01-17 – 2018-01-24 (×8): 1 via ORAL
  Filled 2018-01-17 (×8): qty 1

## 2018-01-17 MED ORDER — CINACALCET HCL 30 MG PO TABS
90.0000 mg | ORAL_TABLET | Freq: Every day | ORAL | Status: DC
  Administered 2018-01-17 – 2018-01-24 (×6): 90 mg via ORAL
  Filled 2018-01-17 (×8): qty 3

## 2018-01-17 MED ORDER — DIPHENHYDRAMINE HCL 25 MG PO CAPS
50.0000 mg | ORAL_CAPSULE | ORAL | Status: DC
  Administered 2018-01-19 – 2018-01-23 (×2): 50 mg via ORAL
  Filled 2018-01-17 (×2): qty 2

## 2018-01-17 MED ORDER — OXYCODONE-ACETAMINOPHEN 5-325 MG PO TABS: 1.0000 | ORAL_TABLET | Freq: Four times a day (QID) | ORAL | Status: DC | PRN

## 2018-01-17 MED ORDER — INSULIN GLARGINE 100 UNIT/ML ~~LOC~~ SOLN
4.0000 [IU] | Freq: Every day | SUBCUTANEOUS | Status: DC
  Administered 2018-01-17 – 2018-01-20 (×4): 4 [IU] via SUBCUTANEOUS
  Filled 2018-01-17 (×5): qty 0.04

## 2018-01-17 MED ORDER — SEVELAMER CARBONATE 800 MG PO TABS
1600.0000 mg | ORAL_TABLET | ORAL | Status: DC | PRN
  Filled 2018-01-17: qty 2

## 2018-01-17 MED ORDER — SEVELAMER CARBONATE 800 MG PO TABS
2400.0000 mg | ORAL_TABLET | Freq: Three times a day (TID) | ORAL | Status: DC
  Administered 2018-01-17 – 2018-01-18 (×6): 2400 mg via ORAL
  Filled 2018-01-17 (×6): qty 3

## 2018-01-17 MED ORDER — BENZONATATE 100 MG PO CAPS
200.0000 mg | ORAL_CAPSULE | Freq: Three times a day (TID) | ORAL | Status: DC | PRN
  Administered 2018-01-17 – 2018-01-21 (×3): 200 mg via ORAL
  Filled 2018-01-17 (×4): qty 2

## 2018-01-17 MED ORDER — ATORVASTATIN CALCIUM 40 MG PO TABS
40.0000 mg | ORAL_TABLET | Freq: Every day | ORAL | Status: DC
  Administered 2018-01-17 – 2018-01-24 (×8): 40 mg via ORAL
  Filled 2018-01-17 (×8): qty 1

## 2018-01-17 NOTE — Consult Note (Addendum)
[facility] Renal Consultation Note    Indication for Consultation:  Management of ESRD/hemodialysis; anemia, hypertension/volume and secondary hyperparathyroidism PCP: Dr. [name]  HPI: [name] is a 72 y.o. female with ESRD on hemodialysis T,Th,S at [facility]. PMH significant for DMT2, morbid obesity, HFpEF, HTN, HLD, gout, CAD, OSA, AOCD, SHPT. She is blind in R eye. Last hemodialysis 01/16/2018, completed full treatment, left 0.2 kg under OP EDW 125.5 kg.   Patient presented to ED with C/O cough and SOB. She has been admitted as observation patient for COPD exacerbation. No evidence of volume overload by exam or CXR. K+ 4.1 Lactic acid 1.26 HGB 10.9 WBC 10.9. Respiratory panel positive for rhinovirus, negative influenza.   Per patient, she developed cough and SOB at home one week ago but tried "home remedies" instead of calling her PCP. Says SOB not relieved with hemodialysis. She is currently up sink doing her AM care. She becomes breathless when talking but O2 sats 99% on RA. Denies fever at home but thinks she had temperature here last night.    Past Medical History:  Diagnosis Date  . Abdominal abscess 12-17-10   abdominal abscesses x2 ? one at this time  . Anemia   . Arthritis    "all over" (08/23/2012)  . Blind right eye    fell and crushed socket and eye fell out, was replaced at [facility]  . CHF (congestive heart failure) ([place])   . Chronic lower back pain   . Coronary artery disease    normal coronaries by 08/25/12 cath  . Dyspnea   . Dysrhythmia    irregular, "skips beats"  . ESRD (end stage renal disease) on dialysis ([place])    "started 12/2011; [place]; TTS" (08/23/2012  . Exertional shortness of breath   . Eye drainage    "lots; since fall 07/2011" (08/23/2012)  . Gout   . H/O hiatal hernia   . Headache(784.0)    "weekly sometimes; since I fell and hit my head in 07/2011 08/23/2012)  . Heart murmur   . History of blood  transfusion    "lots before starting dialysis" (08/23/2012)  . Hypertension    sees Dr. Willey Blade  . Inhalation injury    "worked at [employer]; can't inhale polyurethane or paint, etc" (08/23/2012)  . Myocardial infarction ([place]) 1970's or 1980's  . Neuromuscular disorder ([place])    diabetic neuropathy  . OSA on CPAP   . Staph aureus infection November 2012   . Swelling of both ankles 12-17-10  . Type II diabetes mellitus ([place])    "over 10 years now" (08/23/2012)   Past Surgical History:  Procedure Laterality Date  . [place] VITRECTOMY WITH 20 GAUGE MVR PORT Left 05/07/2017   Procedure: 25 GAUGE PARS PLANA VITRECTOMY WITH ENDOLASER LEFT EYE;  Surgeon: Bernarda Caffey, MD;  Location: [place];  Service: Ophthalmology;  Laterality: Left;  . APPENDECTOMY    . AV FISTULA PLACEMENT  09/30/2011   Procedure: ARTERIOVENOUS (AV) FISTULA CREATION;  Surgeon: Conrad [place]reve[place], MD;  Location: [place];  Service: Vascular;  Laterality: Left;  BRACHIAL-CEPHALIC  . BACK SURGERY    . BASCILIC VEIN TRANSPOSITION Left 11/10/2011   Left arm   . [place] TRANSPOSITION  01/28/2012   Procedure: BASCILIC VEIN TRANSPOSITION;  Surgeon: Conrad [place], MD;  Location: [place];  Service: Vascular;  Laterality: Left;  Second Stage   . BIOPSY  08/24/2017   Procedure: BIOPSY;  Surgeon: Wilford Corner, MD;  [facility]: [facility];  Service: Endoscopy;;  . CARDIAC CATHETERIZATION  1980's  . CATARACT EXTRACTION W/ INTRAOCULAR LENS IMPLANT Bilateral 1990's  . COLONOSCOPY WITH PROPOFOL N/A 08/24/2017   Procedure: COLONOSCOPY WITH PROPOFOL;  Surgeon: Wilford Corner, MD;  [facility];  Service: Endoscopy;  Laterality: N/A;  . CORONARY ANGIOGRAPHY N/A 04/07/2017   Procedure: CORONARY ANGIOGRAPHY (CATH LAB);  Surgeon: Dixie Dials, MD;  Location: [place] CV LAB;  Service: Cardiovascular;  Laterality: N/A;  . DILATION AND CURETTAGE OF UTERUS  1960's  . ESOPHAGOGASTRODUODENOSCOPY (EGD) WITH PROPOFOL N/A  08/24/2017   Procedure: ESOPHAGOGASTRODUODENOSCOPY (EGD) WITH PROPOFOL;  Surgeon: Wilford Corner, MD;  [facility];  Service: Endoscopy;  Laterality: N/A;  . EYE SURGERY    . FISTULA SUPERFICIALIZATION Left 02/18/2017   Procedure: SUPERFICIALIZATION LEFT ARM ARTERIOVENOUS FISTULA;  Surgeon: Serafina Mitchell, MD;  Location: [place];  Service: Vascular;  Laterality: Left;  . HERNIA REPAIR    . INSERTION OF DIALYSIS CATHETER  01/05/2012   Procedure: INSERTION OF DIALYSIS CATHETER;  Surgeon: Conrad [place], MD;  Location: [place];  Service: Vascular;  Laterality: N/A;  Right Internal Jugular Placement  . INTRAOCULAR PROSTHESES INSERTION Right 2013   "fell; knocked my eye outl" (08/23/2012)  . JOINT REPLACEMENT    . LAPAROTOMY  12/18/2010   Procedure: EXPLORATORY LAPAROTOMY;  Surgeon: Rolm Bookbinder, MD;  [facility];  Service: General;  Laterality: N/A;  Abdominal Seroma Evacuation  . LEFT HEART CATHETERIZATION WITH CORONARY ANGIOGRAM N/A 08/25/2012   Procedure: LEFT HEART CATHETERIZATION WITH CORONARY ANGIOGRAM;  Surgeon: Birdie Riddle, MD;  Location: [place] CATH LAB;  Service: Cardiovascular;  Laterality: N/A;  . LUMBAR DISC SURGERY  1970'-80's   X 3  . TOTAL KNEE ARTHROPLASTY Left 1980's  . VAGINAL HYSTERECTOMY  1970's  . VENTRAL HERNIA REPAIR  2012-2013   component separation, repair with biologic; "had 3 surgeries to fix it" (08/23/2012)  . WOUND DEBRIDEMENT  03/20/2011   Procedure: DEBRIDEMENT ABDOMINAL WOUND;  Surgeon: Rolm Bookbinder, MD;  Location: [place];  Service: General;  Laterality: N/A;  debridement abdominal wall, placement of wound vac   Family History  Problem Relation Age of Onset  . Hypertension Mother   . Cancer Brother        spine  . Hypertension Father   . Cancer Sister        brain  . Hypertension Maternal Grandmother   . Anesthesia problems Neg Hx    Social History:  reports that she quit smoking about 37 years ago. Her smoking use included cigarettes.  She has a 4.00 pack-year smoking history. She has never used smokeless tobacco. She reports that she does not drink alcohol or use drugs. Allergies  Allergen Reactions  . Lyrica [Pregabalin] Other (See Comments)    Hallucinations   Prior to Admission medications   Medication Sig Start Date End Date Taking? Authorizing Provider  albuterol (PROVENTIL) (2.5 MG/3ML) 0.083% nebulizer solution Take 3 mLs (2.5 mg total) by nebulization every 2 (two) hours as needed for shortness of breath. 10/21/16   Elwin Mocha, MD  aspirin EC 81 MG tablet Take 81 mg by mouth daily.      [provider]  atorvastatin (LIPITOR) 40 MG tablet Take 40 mg by mouth daily.  04/26/17   [provider]  carvedilol (COREG) 25 MG tablet Take 0.5 tablets (12.5 mg total) by mouth 2 (two) times daily. Patient taking differently: Take 25 mg by mouth  2 (two) times daily.  04/09/17   Dixie Dials, MD  cinacalcet (SENSIPAR) 90 MG tablet Take 90 mg by mouth daily.     [provider]  diphenhydrAMINE (BENADRYL) 25 mg capsule Take 50 mg by mouth See admin instructions. Take 2 tablets (50 mg) by mouth prior to dialysis on Tuesday, Thursday, Saturday - for itching    [provider]  ethyl chloride spray Apply 1 application topically See admin instructions. Apply topically before dialysis on Tuesday, Thursday, Saturday 01/10/16   [provider]  insulin glargine (LANTUS) 100 UNIT/ML injection Inject 0.05 mLs (5 Units total) into the skin at bedtime. Patient taking differently: Inject 6 Units into the skin at bedtime.  04/09/17   Dixie Dials, MD  midodrine (PROAMATINE) 10 MG tablet Take 10 mg by mouth See admin instructions. Take 10mg  on Tues, Thurs, and Saturday before dialysis 04/26/17   [provider]  Multiple Vitamin (MULTIVITAMIN WITH MINERALS) TABS tablet Take 1 tablet by mouth daily.    [provider]  oxyCODONE-acetaminophen (PERCOCET/ROXICET) 5-325 MG tablet Take 1  tablet by mouth 4 (four) times daily as needed for pain. 08/03/17   [provider]  sevelamer carbonate (RENVELA) 800 MG tablet Take 2-3 tablets (1,600-2,400 mg total) by mouth See admin instructions. Take 3 tablets (2,400 mg) by mouth 3 times daily with meals. Take 2 tablets (1,600 mg) by mouth with snacks. Receives supply from dialysis center. Patient taking differently: Take 1,600-2,400 mg by mouth See admin instructions. Pt takes 4 tablets after every meal 04/09/17   Dixie Dials, MD  tiotropium (SPIRIVA) 18 MCG inhalation capsule Place 1 capsule (18 mcg total) into inhaler and inhale daily. Patient taking differently: Place 18 mcg into inhaler and inhale daily as needed (shortness of breath).  10/21/16   Elwin Mocha, MD   Current Facility-Administered Medications  Medication Dose Route Frequency Provider Last Rate Last Dose  . acetaminophen (TYLENOL) tablet 650 mg  650 mg Oral Q6H PRN Ivor Costa, MD      . albuterol (PROVENTIL) (2.5 MG/3ML) 0.083% nebulizer solution 2.5 mg  2.5 mg Nebulization Q4H PRN Ivor Costa, MD   2.5 mg at 01/17/18 1212  . aspirin EC tablet 81 mg  81 mg Oral Daily Ivor Costa, MD   81 mg at 01/17/18 6045  . atorvastatin (LIPITOR) tablet 40 mg  40 mg Oral Daily Ivor Costa, MD   40 mg at 01/17/18 0813  . [START ON 01/18/2018] azithromycin (ZITHROMAX) tablet 250 mg  250 mg Oral Daily Ivor Costa, MD      . benzonatate (TESSALON) capsule 200 mg  200 mg Oral TID PRN Lovey Newcomer T, NP   200 mg at 01/17/18 0438  . cinacalcet (SENSIPAR) tablet 90 mg  90 mg Oral Q breakfast Ivor Costa, MD   90 mg at 01/17/18 0813  . dextromethorphan-guaiFENesin (MUCINEX DM) 30-600 MG per 12 hr tablet 1 tablet  1 tablet Oral BID PRN Ivor Costa, MD   1 tablet at 01/17/18 [phone]  . [START ON 01/19/2018] diphenhydrAMINE (BENADRYL) capsule 50 mg  50 mg Oral Q T,Th,Sa-HD Ivor Costa, MD      . heparin injection 5,000 Units  5,000 Units Subcutaneous Cleophas Dunker, MD   5,000 Units at 01/17/18  0617  . hydrALAZINE (APRESOLINE) injection 5 mg  5 mg Intravenous Q2H PRN Ivor Costa, MD      . insulin aspart (novoLOG) injection 0-9 Units  0-9 Units Subcutaneous TID WC Ivor Costa, MD  2 Units at 01/17/18 1233  . insulin glargine (LANTUS) injection 4 Units  4 Units Subcutaneous QHS Ivor Costa, MD      . ipratropium-albuterol (DUONEB) 0.5-2.5 (3) MG/3ML nebulizer solution 3 mL  3 mL Nebulization BID Hosie Poisson, MD      . methylPREDNISolone sodium succinate (SOLU-MEDROL) 125 mg/2 mL injection 60 mg  60 mg Intravenous TID Ivor Costa, MD   60 mg at 01/17/18 0815  . midodrine (PROAMATINE) tablet 10 mg  10 mg Oral TID Ivor Costa, MD   10 mg at 01/17/18 0814  . multivitamin with minerals tablet 1 tablet  1 tablet Oral Daily Ivor Costa, MD   1 tablet at 01/17/18 0813  . ondansetron (ZOFRAN) injection 4 mg  4 mg Intravenous Q8H PRN Ivor Costa, MD      . oxyCODONE-acetaminophen (PERCOCET/ROXICET) 5-325 MG per tablet 1 tablet  1 tablet Oral QID PRN Ivor Costa, MD      . Derrill Memo ON 01/19/2018] pentafluoroprop-tetrafluoroeth (GEBAUERS) aerosol 1 application  1 application Topical Q T,Th,Sa-HD Ivor Costa, MD      . sevelamer carbonate (RENVELA) tablet 1,600 mg  1,600 mg Oral PRN Ivor Costa, MD      . sevelamer carbonate (RENVELA) tablet 2,400 mg  2,400 mg Oral TID WC Ivor Costa, MD   2,400 mg at 01/17/18 1227  . umeclidinium bromide (INCRUSE ELLIPTA) 62.5 MCG/INH 1 puff  1 puff Inhalation Daily PRN Ivor Costa, MD      . zolpidem (AMBIEN) tablet 5 mg  5 mg Oral QHS PRN Ivor Costa, MD       Labs: Basic Metabolic Panel: Recent Labs  Lab 01/16/18 1822 01/16/18 1846  NA 141 141  K 4.2 4.1  CL 97* 99  CO2 28  --   GLUCOSE 106* 102*  BUN 29* 34*  CREATININE 6.97* 7.00*  CALCIUM 8.9  --    Liver Function Tests: No results for input(s): AST, ALT, ALKPHOS, BILITOT, PROT, ALBUMIN in the last 168 hours. No results for input(s): LIPASE, AMYLASE in the last 168 hours. No results for input(s): AMMONIA in  the last 168 hours. CBC: Recent Labs  Lab 01/16/18 1822 01/16/18 1846  WBC 10.1  --   NEUTROABS 5.8  --   HGB 10.9* 12.2  HCT 35.8* 36.0  MCV 94.2  --   PLT 183  --    Cardiac Enzymes: No results for input(s): CKTOTAL, CKMB, CKMBINDEX, TROPONINI in the last 168 hours. CBG: Recent Labs  Lab 01/16/18 2214 01/17/18 0755 01/17/18 1154  GLUCAP 147* 166* 157*   Iron Studies: No results for input(s): IRON, TIBC, TRANSFERRIN, FERRITIN in the last 72 hours. Studies/Results: Dg Chest Port 1 View  Result Date: 01/16/2018 CLINICAL DATA:  Fever x1 day. SOB, cough x3 days. Pt states sometimes the cough is productive and other times it is dry. Hx of HTN, DM, CHF. Hx of ESRD, MI. Ex-smoker, quit APPROX.40 years ago. EXAM: PORTABLE CHEST 1 VIEW COMPARISON:  04/02/2017 FINDINGS: The heart is enlarged. There is atherosclerotic calcification of the thoracic aorta. No focal consolidation. No pulmonary edema. Interval removal of dialysis catheter. IMPRESSION: Cardiomegaly. Electronically Signed   By: Nolon Nations M.D.   On: 01/16/2018 19:53    ROS: As per HPI otherwise negative.   Physical Exam: Vitals:   01/17/18 0417 01/17/18 0829 01/17/18 0832 01/17/18 1212  BP: (!) 108/54 (!) 110/43    Pulse: 78 77    Resp: (!) 23 (!) 24    Temp: 97.7  F (36.5 C)     TempSrc: Oral     SpO2: 97% 95% 100% 99%  Weight: 125.6 kg     Height:         General: Well developed, well nourished, in no acute distress. Head: Normocephalic, atraumatic, sclera non-icteric, mucus membranes are moist Neck: Supple. JVD not elevated. Lungs: Bilateral breath sounds decreased in bases with scattered coarse rhonic, few inspiratory wheezes.  Heart: RRR with S1 S2. No murmurs, rubs, or gallops appreciated. Abdomen: Soft, non-tender, non-distended with normoactive bowel sounds. No rebound/guarding. No obvious abdominal masses. M-S:  Strength and tone appear normal for age. Lower extremities:without edema or ischemic  changes, no open wounds  Neuro: Alert and oriented X 3. Moves all extremities spontaneously. Psych:  Responds to questions appropriately with a normal affect. Dialysis Access: LUA AVF+ bruit  Dialysis Orders: [place] T,Th,S 4.5 hrs 180NRe 450/Autoflow 1.5 125.5 kg 2.0 K/ 2.0 Ca  LUA AVF -Heparin 13,000 units IV TIW -Mircera 60 mcg IV q 2 weeks (last dose 01/07/18 Last HGB 10.1 Tsat 14 Iron 28 01/14/18) -Calcitriol 2.5 mcg PO TIW (last PTH 511 Ca 8.9 C Ca 9.1 Phos 3.8 01/14/18)   Assessment/Plan: 1.  COPD Exacerbation-per primary. 2.  ESRD -  T,Th,S HD tomorrow AM if Dc'd home, 01/19/18 if pt remains in pt.  3.  Hypertension/volume  - BP controlled, no evidence of volume overload.  4.  Anemia  - HGB 10.9 ESA due later this week. Low Tsat at OP HD center. High Ferritin prohibiting Fe load.  5.  Metabolic bone disease - continue binders, VDRA.  6.  Nutrition - Renal/Carb mod diet  7.  DM- per primary  8.  H/O HFpEF-Last EF 60-65% 04/06/17. No evidence of volume overload.   Rita H. Owens Shark, NP-C 01/17/2018, 1:37 PM  [employer] [phone]  I have seen and examined this patient and agree with the plan of care. Pt left below her EDW and CXR with no e/o pulmonary edema. Plan on HD Tuesday and then she can resume outpt HD thur or sat. Appears to be COPD exacerbation.   Dwana Melena, MD 01/17/2018, 3:38 PM

## 2018-01-17 NOTE — Progress Notes (Signed)
PROGRESS NOTE    [name]  QPY:[id number] DOB: 1945-11-04 DOA: 01/16/2018 PCP: [name], MD    Brief Narrative: [name] is a 72 y.o. female with medical history significant of ESRD-HD (TTS), hypertension, hyperlipidemia, diabetes mellitus, gout, right eye blindness, dCHF, CAD, OSA on CPAP, former smoker (smoked >20 years), who presents with cough and shortness breath.. She was admitted for acute COPD exacerbation  Assessment & Plan:   Principal Problem:   COPD exacerbation ([place]) Active Problems:   OSA on CPAP   Coronary artery disease   ESRD needing dialysis ([place])   Type II diabetes mellitus with renal manifestations ([facility])   Chest pain   Chronic diastolic CHF (congestive heart failure) ([facility])   Hypotension   Acute bronchitis   Essential hypertension   HLD (hyperlipidemia)   Acute COPD exacerbation with acute bronchitis Start the patient on IV steroids.  She is currently on room air sats are in the low 90s. Continue with steroids, duo nebs, Tessalon and Robitussin. Chest x-ray is negative for acute pneumonia at this time but she does appear to have acute bronchitis so we will continue with IV Zithromax for now. Sputum cultures ordered. Influenza PCR and respiratory panel are still pending. Blood cultures ordered and are pending at this time. Continue with incentive spirometry.    Chest pain Probably pleuritic and associated with cough. Low suspicion for ACS   History of coronary artery disease Continue with aspirin and Lipitor.   Hypertension Holding Coreg for borderline blood pressures.   End-stage renal disease on dialysis Nephrology consulted for HD the next 24 hours.   Obstructive sleep apnea Please continue with CPAP.    Type 2 diabetes mellitus with renal manifestations Continue with sliding scale insulin at this time.  Get hemoglobin A1c.  DVT prophylaxis: Lovenox Code Status: Full code Family Communication: None at bedside,  discussed the plan with the patient Disposition Plan: Pending clinical improvement  Consultants:   Nephrology for HD Dr. Royce Macadamia  Procedures: None  antimicrobials: Zithromax since admission Subjective: Continues to have shortness of breath and tachypnea, cough and chest pain with cough  Objective: Vitals:   01/17/18 0327 01/17/18 0417 01/17/18 0829 01/17/18 0832  BP:  (!) 108/54 (!) 110/43   Pulse:  78 77   Resp:  (!) 23 (!) 24   Temp:  97.7 F (36.5 C)    TempSrc:  Oral    SpO2: 94% 97% 95% 100%  Weight:  125.6 kg    Height:        Intake/Output Summary (Last 24 hours) at 01/17/2018 1211 Last data filed at 01/16/2018 2210 Gross per 24 hour  Intake 1340 ml  Output 0 ml  Net 1340 ml   Filed Weights   01/16/18 1945 01/16/18 2210 01/17/18 0417  Weight: 123.4 kg 125.9 kg 125.6 kg    Examination:  General exam: Mild distress from shortness of breath Respiratory system: Bilateral expiratory wheezing heard both anteriorly and posteriorly Cardiovascular system: S1 & S2 heard, RRR. No JVD,  Gastrointestinal system: Abdomen is nondistended, soft and nontender.Normal bowel sounds heard. Central nervous system: Alert and oriented. No focal neurological deficits. Extremities: Symmetric 5 x 5 power. Skin: No rashes, lesions or ulcers Psychiatry:  Mood & affect appropriate.     Data Reviewed: I have personally reviewed following labs and imaging studies  CBC: Recent Labs  Lab 01/16/18 1822 01/16/18 1846  WBC 10.1  --   NEUTROABS 5.8  --   HGB 10.9* 12.2  HCT 35.8* 36.0  MCV 94.2  --   PLT 183  --    Basic Metabolic Panel: Recent Labs  Lab 01/16/18 1822 01/16/18 1846  NA 141 141  K 4.2 4.1  CL 97* 99  CO2 28  --   GLUCOSE 106* 102*  BUN 29* 34*  CREATININE 6.97* 7.00*  CALCIUM 8.9  --    GFR: Estimated Creatinine Clearance: 9.7 mL/min (A) (by C-G formula based on SCr of 7 mg/dL (H)). Liver Function Tests: No results for input(s): AST, ALT, ALKPHOS,  BILITOT, PROT, ALBUMIN in the last 168 hours. No results for input(s): LIPASE, AMYLASE in the last 168 hours. No results for input(s): AMMONIA in the last 168 hours. Coagulation Profile: Recent Labs  Lab 01/16/18 1822  INR 1.13   Cardiac Enzymes: No results for input(s): CKTOTAL, CKMB, CKMBINDEX, TROPONINI in the last 168 hours. BNP (last 3 results) No results for input(s): PROBNP in the last 8760 hours. HbA1C: No results for input(s): HGBA1C in the last 72 hours. CBG: Recent Labs  Lab 01/16/18 2214 01/17/18 0755 01/17/18 1154  GLUCAP 147* 166* 157*   Lipid Profile: No results for input(s): CHOL, HDL, LDLCALC, TRIG, CHOLHDL, LDLDIRECT in the last 72 hours. Thyroid Function Tests: No results for input(s): TSH, T4TOTAL, FREET4, T3FREE, THYROIDAB in the last 72 hours. Anemia Panel: No results for input(s): VITAMINB12, FOLATE, FERRITIN, TIBC, IRON, RETICCTPCT in the last 72 hours. Sepsis Labs: Recent Labs  Lab 01/16/18 1847 01/16/18 2012  LATICACIDVEN 1.26 1.47    Recent Results (from the past 240 hour(s))  Blood culture (routine x 2)     Status: None (Preliminary result)   Collection Time: 01/16/18  7:38 PM  Result Value Ref Range Status   Specimen Description BLOOD BLOOD RIGHT WRIST  Final   Special Requests   Final    BOTTLES DRAWN AEROBIC AND ANAEROBIC Blood Culture adequate volume   Culture   Final    NO GROWTH < 24 HOURS Performed at [facility], [place] [address], [place], [place] [address]    Report Status PENDING  Incomplete  Blood culture (routine x 2)     Status: None (Preliminary result)   Collection Time: 01/16/18  7:42 PM  Result Value Ref Range Status   Specimen Description BLOOD BLOOD RIGHT HAND  Final   Special Requests   Final    BOTTLES DRAWN AEROBIC AND ANAEROBIC Blood Culture adequate volume   Culture   Final    NO GROWTH < 24 HOURS Performed at [facility], [place] [address], [place], [place] [address]    Report Status PENDING   Incomplete  MRSA PCR Screening     Status: None   Collection Time: 01/17/18  3:33 AM  Result Value Ref Range Status   MRSA by PCR NEGATIVE NEGATIVE Final    Comment:        The GeneXpert MRSA Assay (FDA approved for NASAL specimens only), is one component of a comprehensive MRSA colonization surveillance program. It is not intended to diagnose MRSA infection nor to guide or monitor treatment for MRSA infections. Performed at [facility], [place] [address] [place], [place] [address]   Culture, sputum-assessment     Status: None   Collection Time: 01/17/18  3:56 AM  Result Value Ref Range Status   Specimen Description SPUTUM  Final   Special Requests   Final    NONE Performed at [facility], [place],  [place] [address]    Sputum evaluation   Final    Sputum specimen not acceptable for testing.  Please recollect.   RESULT CALLED TO, READ BACK BY AND VERIFIED WITH: RN Boyd Kerbs [phone] [place]    Report Status 01/17/2018 FINAL  Final         Radiology Studies: Dg Chest Port 1 View  Result Date: 01/16/2018 CLINICAL DATA:  Fever x1 day. SOB, cough x3 days. Pt states sometimes the cough is productive and other times it is dry. Hx of HTN, DM, CHF. Hx of ESRD, MI. Ex-smoker, quit APPROX.40 years ago. EXAM: PORTABLE CHEST 1 VIEW COMPARISON:  04/02/2017 FINDINGS: The heart is enlarged. There is atherosclerotic calcification of the thoracic aorta. No focal consolidation. No pulmonary edema. Interval removal of dialysis catheter. IMPRESSION: Cardiomegaly. Electronically Signed   By: Nolon Nations M.D.   On: 01/16/2018 19:53        Scheduled Meds: . aspirin EC  81 mg Oral Daily  . atorvastatin  40 mg Oral Daily  . [START ON 01/18/2018] azithromycin  250 mg Oral Daily  . cinacalcet  90 mg Oral Q breakfast  . [START ON 01/19/2018] diphenhydrAMINE  50 mg Oral Q T,Th,Sa-HD  . heparin  5,000 Units Subcutaneous Q8H  . insulin aspart  0-9 Units  Subcutaneous TID WC  . insulin glargine  4 Units Subcutaneous QHS  . ipratropium-albuterol  3 mL Nebulization BID  . methylPREDNISolone (SOLU-MEDROL) injection  60 mg Intravenous TID  . midodrine  10 mg Oral TID  . multivitamin with minerals  1 tablet Oral Daily  . [START ON 01/19/2018] pentafluoroprop-tetrafluoroeth  1 application Topical Q T,Th,Sa-HD  . sevelamer carbonate  2,400 mg Oral TID WC   Continuous Infusions:   LOS: 0 days    Time spent: 36 Minutes.     Hosie Poisson, MD [facility] Pager [phone]  If 7PM-7AM, please contact night-coverage [email or web address] Password [email or web address] 01/17/2018, 12:11 PM

## 2018-01-17 NOTE — Progress Notes (Signed)
Pt asked about wearing CPAP for the night.  She stated she doesn't got to sleep until 2am.  Pt instructed to have RN call RT when pt is ready to be placed on CPAP.

## 2018-01-17 NOTE — Plan of Care (Signed)
Cardiac Monitor. AM Labs.

## 2018-01-18 DIAGNOSIS — M109 Gout, unspecified: Secondary | ICD-10-CM | POA: Diagnosis present

## 2018-01-18 DIAGNOSIS — I2510 Atherosclerotic heart disease of native coronary artery without angina pectoris: Secondary | ICD-10-CM | POA: Diagnosis present

## 2018-01-18 DIAGNOSIS — E1122 Type 2 diabetes mellitus with diabetic chronic kidney disease: Secondary | ICD-10-CM | POA: Diagnosis present

## 2018-01-18 DIAGNOSIS — R0602 Shortness of breath: Secondary | ICD-10-CM | POA: Diagnosis present

## 2018-01-18 DIAGNOSIS — R0603 Acute respiratory distress: Secondary | ICD-10-CM | POA: Diagnosis present

## 2018-01-18 DIAGNOSIS — I959 Hypotension, unspecified: Secondary | ICD-10-CM | POA: Diagnosis present

## 2018-01-18 DIAGNOSIS — Z794 Long term (current) use of insulin: Secondary | ICD-10-CM

## 2018-01-18 DIAGNOSIS — E8889 Other specified metabolic disorders: Secondary | ICD-10-CM | POA: Diagnosis present

## 2018-01-18 DIAGNOSIS — Z6841 Body Mass Index (BMI) 40.0 and over, adult: Secondary | ICD-10-CM | POA: Diagnosis not present

## 2018-01-18 DIAGNOSIS — R0600 Dyspnea, unspecified: Secondary | ICD-10-CM | POA: Diagnosis not present

## 2018-01-18 DIAGNOSIS — J206 Acute bronchitis due to rhinovirus: Secondary | ICD-10-CM | POA: Diagnosis present

## 2018-01-18 DIAGNOSIS — N2581 Secondary hyperparathyroidism of renal origin: Secondary | ICD-10-CM | POA: Diagnosis present

## 2018-01-18 DIAGNOSIS — J441 Chronic obstructive pulmonary disease with (acute) exacerbation: Secondary | ICD-10-CM | POA: Diagnosis present

## 2018-01-18 DIAGNOSIS — N186 End stage renal disease: Secondary | ICD-10-CM | POA: Diagnosis present

## 2018-01-18 DIAGNOSIS — I700 Atherosclerosis of aorta: Secondary | ICD-10-CM | POA: Diagnosis present

## 2018-01-18 DIAGNOSIS — E785 Hyperlipidemia, unspecified: Secondary | ICD-10-CM

## 2018-01-18 DIAGNOSIS — E1140 Type 2 diabetes mellitus with diabetic neuropathy, unspecified: Secondary | ICD-10-CM | POA: Diagnosis present

## 2018-01-18 DIAGNOSIS — I132 Hypertensive heart and chronic kidney disease with heart failure and with stage 5 chronic kidney disease, or end stage renal disease: Secondary | ICD-10-CM | POA: Diagnosis present

## 2018-01-18 DIAGNOSIS — D631 Anemia in chronic kidney disease: Secondary | ICD-10-CM | POA: Diagnosis present

## 2018-01-18 DIAGNOSIS — H5461 Unqualified visual loss, right eye, normal vision left eye: Secondary | ICD-10-CM | POA: Diagnosis present

## 2018-01-18 DIAGNOSIS — G4733 Obstructive sleep apnea (adult) (pediatric): Secondary | ICD-10-CM | POA: Diagnosis present

## 2018-01-18 DIAGNOSIS — R079 Chest pain, unspecified: Secondary | ICD-10-CM | POA: Diagnosis not present

## 2018-01-18 DIAGNOSIS — E871 Hypo-osmolality and hyponatremia: Secondary | ICD-10-CM | POA: Diagnosis present

## 2018-01-18 DIAGNOSIS — J440 Chronic obstructive pulmonary disease with acute lower respiratory infection: Secondary | ICD-10-CM | POA: Diagnosis present

## 2018-01-18 DIAGNOSIS — I10 Essential (primary) hypertension: Secondary | ICD-10-CM

## 2018-01-18 DIAGNOSIS — I5032 Chronic diastolic (congestive) heart failure: Secondary | ICD-10-CM | POA: Diagnosis present

## 2018-01-18 DIAGNOSIS — Z992 Dependence on renal dialysis: Secondary | ICD-10-CM | POA: Diagnosis not present

## 2018-01-18 DIAGNOSIS — J9811 Atelectasis: Secondary | ICD-10-CM | POA: Diagnosis present

## 2018-01-18 LAB — BASIC METABOLIC PANEL
Anion gap: 17 — ABNORMAL HIGH (ref 5–15)
BUN: 68 mg/dL — ABNORMAL HIGH (ref 8–23)
CO2: 25 mmol/L (ref 22–32)
CREATININE: 10.21 mg/dL — AB (ref 0.44–1.00)
Calcium: 8.8 mg/dL — ABNORMAL LOW (ref 8.9–10.3)
Chloride: 95 mmol/L — ABNORMAL LOW (ref 98–111)
GFR calc Af Amer: 4 mL/min — ABNORMAL LOW (ref >60)
GFR calc non Af Amer: 3 mL/min — ABNORMAL LOW (ref >60)
Glucose, Bld: 330 mg/dL — ABNORMAL HIGH (ref 70–99)
Potassium: 5.1 mmol/L (ref 3.5–5.1)
Sodium: 137 mmol/L (ref 135–145)

## 2018-01-18 LAB — GLUCOSE, CAPILLARY
GLUCOSE-CAPILLARY: 187 mg/dL — AB (ref 70–99)
Glucose-Capillary: 196 mg/dL — ABNORMAL HIGH (ref 70–99)
Glucose-Capillary: 279 mg/dL — ABNORMAL HIGH (ref 70–99)
Glucose-Capillary: 287 mg/dL — ABNORMAL HIGH (ref 70–99)

## 2018-01-18 LAB — HIV ANTIBODY (ROUTINE TESTING W REFLEX): HIV Screen 4th Generation wRfx: NONREACTIVE

## 2018-01-18 LAB — CBC
HCT: 32.9 % — ABNORMAL LOW (ref 36.0–46.0)
Hemoglobin: 10.3 g/dL — ABNORMAL LOW (ref 12.0–15.0)
MCH: 28.8 pg (ref 26.0–34.0)
MCHC: 31.3 g/dL (ref 30.0–36.0)
MCV: 91.9 fL (ref 80.0–100.0)
Platelets: 222 10*3/uL (ref 150–400)
RBC: 3.58 MIL/uL — ABNORMAL LOW (ref 3.87–5.11)
RDW: 16 % — ABNORMAL HIGH (ref 11.5–15.5)
WBC: 15 10*3/uL — ABNORMAL HIGH (ref 4.0–10.5)
nRBC: 0 % (ref 0.0–0.2)

## 2018-01-18 LAB — HEMOGLOBIN A1C
Hgb A1c MFr Bld: 5.7 % — ABNORMAL HIGH (ref 4.8–5.6)
Mean Plasma Glucose: 116.89 mg/dL

## 2018-01-18 MED ORDER — METHYLPREDNISOLONE SODIUM SUCC 40 MG IJ SOLR
40.0000 mg | Freq: Two times a day (BID) | INTRAMUSCULAR | Status: DC
  Administered 2018-01-18 – 2018-01-19 (×3): 40 mg via INTRAVENOUS
  Filled 2018-01-18 (×3): qty 1

## 2018-01-18 MED ORDER — CALCITRIOL 0.5 MCG PO CAPS: 2.5000 ug | ORAL_CAPSULE | Freq: Once | ORAL | Status: AC

## 2018-01-18 MED ORDER — CALCITRIOL 0.5 MCG PO CAPS
2.5000 ug | ORAL_CAPSULE | ORAL | Status: DC
  Administered 2018-01-19 – 2018-01-23 (×3): 2.5 ug via ORAL

## 2018-01-18 MED ORDER — CHLORHEXIDINE GLUCONATE CLOTH 2 % EX PADS
6.0000 | MEDICATED_PAD | Freq: Every day | CUTANEOUS | Status: DC
  Administered 2018-01-19 – 2018-01-20 (×2): 6 via TOPICAL

## 2018-01-18 MED ORDER — IPRATROPIUM-ALBUTEROL 0.5-2.5 (3) MG/3ML IN SOLN
3.0000 mL | Freq: Four times a day (QID) | RESPIRATORY_TRACT | Status: DC
  Administered 2018-01-18 – 2018-01-21 (×11): 3 mL via RESPIRATORY_TRACT
  Filled 2018-01-18 (×12): qty 3

## 2018-01-18 MED ORDER — DM-GUAIFENESIN ER 30-600 MG PO TB12
1.0000 | ORAL_TABLET | Freq: Two times a day (BID) | ORAL | Status: DC
  Administered 2018-01-19: 1 via ORAL
  Filled 2018-01-18: qty 1

## 2018-01-18 MED ORDER — PREDNISONE 20 MG PO TABS: 40.0000 mg | ORAL_TABLET | Freq: Every day | ORAL | Status: DC

## 2018-01-18 MED ORDER — DARBEPOETIN ALFA 60 MCG/0.3ML IJ SOSY
60.0000 ug | PREFILLED_SYRINGE | INTRAMUSCULAR | Status: DC
  Administered 2018-01-21: 60 ug via INTRAVENOUS
  Filled 2018-01-18: qty 0.3

## 2018-01-18 NOTE — Progress Notes (Addendum)
[name] Progress Note   Dialysis Orders: [place] T,Th,S 4.5 hrs 180NRe 450/Autoflow 1.5 125.5 kg 2.0 K/ 2.0 Ca  LUA AVF -Heparin 13,000 units IV TIW -Mircera 60 mcg IV q 2 weeks (last dose 01/07/18 Last HGB 10.1 Tsat 14 Iron 28 01/14/18) -Calcitriol 2.5 mcg PO TIW (last PTH 511 Ca 8.9 C Ca 9.1 Phos 3.8 01/14/18)  Assessment/Plan: 1. Exacerbation of COPD - per primary + rhinovirus/enterovirus/ steroids/breathing treatment/azithromycin - former smoker 2. ESRD -TTS  HD Tuesday per holiday schedule 3. Anemia - hgb 10.3 - if hgb drops can give short course IV Fe even with high ferritin - continue Aranesp 60 q week 4. Secondary hyperparathyroidism - binders/VDRA ordered 5. HTN/volume - volume ok - prone to low BP on dialysis 6. Nutrition - changed to renal carb mod/vits 7. DM - per primary -   Myriam Jacobson, PA-C [place] [phone] 01/18/2018,9:31 AM  LOS: 0 days   I have seen and examined this patient and agree with the plan of care. Next HD tomorrow; no acute indication for HD today.  Dwana Melena, MD 01/18/2018, 12:32 PM   Subjective:   Just had breathing treatment. Some SOB and coughing when talking  Objective Vitals:   01/17/18 1950 01/18/18 0023 01/18/18 0354 01/18/18 0738  BP:      Pulse:   79   Resp:   20   Temp:   98.5 F (36.9 C)   TempSrc:   Oral   SpO2: 99% 99% 95% 96%  Weight:   128.4 kg   Height:       Physical Exam sats 96% room air General: morbidly obese pleasant female  Heart: RRR Lungs: diffuse scattered wheezing but good air exchange Abdomen: obese Extremities: tr LE edema Dialysis Access: left AVF + bruit   Additional Objective Labs: Basic Metabolic Panel: Recent Labs  Lab 01/16/18 1822 01/16/18 1846 01/18/18 0402  NA 141 141 137  K 4.2 4.1 5.1  CL 97* 99 95*  CO2 28  --  25  GLUCOSE 106* 102* 330*  BUN 29* 34* 68*  CREATININE 6.97* 7.00* 10.21*  CALCIUM 8.9  --  8.8*   Liver  Function Tests: No results for input(s): AST, ALT, ALKPHOS, BILITOT, PROT, ALBUMIN in the last 168 hours. No results for input(s): LIPASE, AMYLASE in the last 168 hours. CBC: Recent Labs  Lab 01/16/18 1822 01/16/18 1846 01/18/18 0402  WBC 10.1  --  15.0*  NEUTROABS 5.8  --   --   HGB 10.9* 12.2 10.3*  HCT 35.8* 36.0 32.9*  MCV 94.2  --  91.9  PLT 183  --  222   Blood Culture    Component Value Date/Time   SDES SPUTUM 01/17/2018 1249   SDES SPUTUM 01/17/2018 1249   SPECREQUEST Normal 01/17/2018 1249   SPECREQUEST Normal Reflexed from [id number] 01/17/2018 1249   CULT CULTURE REINCUBATED FOR BETTER GROWTH 01/17/2018 1249   REPTSTATUS 01/17/2018 FINAL 01/17/2018 1249   REPTSTATUS PENDING 01/17/2018 1249    Cardiac Enzymes: No results for input(s): CKTOTAL, CKMB, CKMBINDEX, TROPONINI in the last 168 hours. CBG: Recent Labs  Lab 01/17/18 0755 01/17/18 1154 01/17/18 1630 01/17/18 2052 01/18/18 0743  GLUCAP 166* 157* 182* 255* 196*   Iron Studies: No results for input(s): IRON, TIBC, TRANSFERRIN, FERRITIN in the last 72 hours. Lab Results  Component Value Date   INR 1.13 01/16/2018   INR 1.12 04/06/2017   INR 1.10 07/28/2015   Studies/Results: Dg Chest  Port 1 View  Result Date: 01/16/2018 CLINICAL DATA:  Fever x1 day. SOB, cough x3 days. Pt states sometimes the cough is productive and other times it is dry. Hx of HTN, DM, CHF. Hx of ESRD, MI. Ex-smoker, quit APPROX.40 years ago. EXAM: PORTABLE CHEST 1 VIEW COMPARISON:  04/02/2017 FINDINGS: The heart is enlarged. There is atherosclerotic calcification of the thoracic aorta. No focal consolidation. No pulmonary edema. Interval removal of dialysis catheter. IMPRESSION: Cardiomegaly. Electronically Signed   By: Nolon Nations M.D.   On: 01/16/2018 19:53   Medications:  . aspirin EC  81 mg Oral Daily  . atorvastatin  40 mg Oral Daily  . azithromycin  250 mg Oral Daily  . cinacalcet  90 mg Oral Q breakfast  . [START ON  01/19/2018] diphenhydrAMINE  50 mg Oral Q T,Th,Sa-HD  . heparin  5,000 Units Subcutaneous Q8H  . insulin aspart  0-9 Units Subcutaneous TID WC  . insulin glargine  4 Units Subcutaneous QHS  . ipratropium-albuterol  3 mL Nebulization Q6H  . methylPREDNISolone (SOLU-MEDROL) injection  40 mg Intravenous Q12H  . midodrine  10 mg Oral TID  . multivitamin with minerals  1 tablet Oral Daily  . [START ON 01/19/2018] pentafluoroprop-tetrafluoroeth  1 application Topical Q T,Th,Sa-HD  . sevelamer carbonate  2,400 mg Oral TID WC

## 2018-01-18 NOTE — Progress Notes (Signed)
PROGRESS NOTE  [name] WNI:[id number] DOB: 11/29/1945 DOA: 01/16/2018 PCP: [name], MD  HPI/Brief Narrative  [name] is a 72 y.o. year old female with medical history significant for ESRD on HD, HTN, type 2 diabetes, diastolic CHF, CAD, OSA on CPAP, former smoker, HLD who presented on 01/16/2018 with 2 to 3 days of worsening nonproductive cough and shortness of breath with no improvement with home inhaler regimen and was found to have acute bronchitis/COPD exacerbation secondary to rhinovirus infection.  Subjective Just received a breathing treatment but still feeling very short of breath  Assessment/Plan:  #Acute bronchitis/COPD flare secondary to rhinovirus. Still significant respiratory distress with inability to complete sentences while talking, diffuse coarse wheezing despite recent breathing treatment. I believe she needs to stay for inpatient course due to significant respiratory distress from the virus exacerbating her COPD. Will discontinue prednisone for IV Solumedrol and increase scheduled duo-nebs (from BID to q6 H)  #ESRD on HD.  Adherent with regimen.  On Tuesday Thursday Saturday schedule, nephrology following.   Secondary hyperparathyroidism.  Continue home Renvela and cinacalcet  Bone mineral disease related to ESRD.  Continue home calcitriol with HD.  Chronic anemia.  Anemia of CKD.  Continue Aranesp with dialysis.  Monitor CBC  #Hypertension, at goal.  Prone to low BP at dialysis.  Continue home Midodrine.  Have not needed to resume home carvedilol (12.5 twice daily).  Hyperlipidemia, stable.  Continue atorvastatin  #Type 2 diabetes A1c 5.7 (12/19).  Monitor CBG.  Continue home Lantus for units (decreased dose).  May need to increase given start IV steroids, will monitor.    Cultures:  12/29 sputum culture normal limits.  RVP positive for rhinovirus    DVT prophylaxis: Consultants:   Treatment Team:   Mauricia Area,  MD   Procedures:  None  Antimicrobials:  12/29, vancomycin and cefepime 12/29 azithromycin---   Code Status: Full code  Family Communication: No family at bedside  Disposition Plan: Needs to remain admitted as inpatient giving significant respiratory distress at rest on with profound wheezing on exam with need to start IV steroids increased frequency of schedule inhalers with close monitoring.        Objective: Vitals:   01/17/18 1950 01/18/18 0023 01/18/18 0354 01/18/18 0738  BP:      Pulse:   79   Resp:   20   Temp:   98.5 F (36.9 C)   TempSrc:   Oral   SpO2: 99% 99% 95% 96%  Weight:   128.4 kg   Height:        Intake/Output Summary (Last 24 hours) at 01/18/2018 0755 Last data filed at 01/17/2018 2157 Gross per 24 hour  Intake 60 ml  Output -  Net 60 ml   Filed Weights   01/16/18 2210 01/17/18 0417 01/18/18 0354  Weight: 125.9 kg 125.6 kg 128.4 kg    Exam:  Constitutional:normal appearing obese female, no acute distress Eyes: EOMI, anicteric, normal conjunctivae ENMT: Oropharynx with moist mucous membranes, Neck: FROM Cardiovascular: RRR no MRGs, with no peripheral edema Respiratory: increased respiratory effort on room air, sitting up but not able to complete full sentences without stopping to gasp air, audible wheezing and again demonstrated on auscultation diffusely in bilateral lung fields at end expiratory phase, no overt accessory muscle usage Abdomen: Soft,non-tender, with no HSM Skin: No rash ulcers, or lesions. Without skin tenting  Neurologic: Grossly no focal neuro deficit. Psychiatric:Appropriate affect, and mood. Mental status AAOx3  Data Reviewed:  CBC: Recent Labs  Lab 01/16/18 1822 01/16/18 1846 01/18/18 0402  WBC 10.1  --  15.0*  NEUTROABS 5.8  --   --   HGB 10.9* 12.2 10.3*  HCT 35.8* 36.0 32.9*  MCV 94.2  --  91.9  PLT 183  --  400   Basic Metabolic Panel: Recent Labs  Lab 01/16/18 1822 01/16/18 1846 01/18/18 0402   NA 141 141 137  K 4.2 4.1 5.1  CL 97* 99 95*  CO2 28  --  25  GLUCOSE 106* 102* 330*  BUN 29* 34* 68*  CREATININE 6.97* 7.00* 10.21*  CALCIUM 8.9  --  8.8*   GFR: Estimated Creatinine Clearance: 6.7 mL/min (A) (by C-G formula based on SCr of 10.21 mg/dL (H)). Liver Function Tests: No results for input(s): AST, ALT, ALKPHOS, BILITOT, PROT, ALBUMIN in the last 168 hours. No results for input(s): LIPASE, AMYLASE in the last 168 hours. No results for input(s): AMMONIA in the last 168 hours. Coagulation Profile: Recent Labs  Lab 01/16/18 1822  INR 1.13   Cardiac Enzymes: No results for input(s): CKTOTAL, CKMB, CKMBINDEX, TROPONINI in the last 168 hours. BNP (last 3 results) No results for input(s): PROBNP in the last 8760 hours. HbA1C: Recent Labs    01/18/18 0402  HGBA1C 5.7*   CBG: Recent Labs  Lab 01/16/18 2214 01/17/18 0755 01/17/18 1154 01/17/18 1630 01/17/18 2052  GLUCAP 147* 166* 157* 182* 255*   Lipid Profile: No results for input(s): CHOL, HDL, LDLCALC, TRIG, CHOLHDL, LDLDIRECT in the last 72 hours. Thyroid Function Tests: No results for input(s): TSH, T4TOTAL, FREET4, T3FREE, THYROIDAB in the last 72 hours. Anemia Panel: No results for input(s): VITAMINB12, FOLATE, FERRITIN, TIBC, IRON, RETICCTPCT in the last 72 hours. Urine analysis:    Component Value Date/Time   COLORURINE YELLOW 07/02/2010 2127   APPEARANCEUR CLOUDY (A) 07/02/2010 2127   LABSPEC 1.011 07/02/2010 2127   PHURINE 6.5 07/02/2010 2127   GLUCOSEU 250 (A) 07/02/2010 2127   HGBUR SMALL (A) 07/02/2010 2127   BILIRUBINUR NEGATIVE 07/02/2010 2127   KETONESUR NEGATIVE 07/02/2010 2127   PROTEINUR >300 (A) 07/02/2010 2127   UROBILINOGEN 0.2 07/02/2010 2127   NITRITE NEGATIVE 07/02/2010 2127   LEUKOCYTESUR LARGE (A) 07/02/2010 2127   Sepsis Labs: @LABRCNTIP (procalcitonin:4,lacticidven:4)  ) Recent Results (from the past 240 hour(s))  Blood culture (routine x 2)     Status: None  (Preliminary result)   Collection Time: 01/16/18  7:38 PM  Result Value Ref Range Status   Specimen Description BLOOD BLOOD RIGHT WRIST  Final   Special Requests   Final    BOTTLES DRAWN AEROBIC AND ANAEROBIC Blood Culture adequate volume   Culture   Final    NO GROWTH < 24 HOURS Performed at [facility], [place] [address] [place], [place] [address]    Report Status PENDING  Incomplete  Blood culture (routine x 2)     Status: None (Preliminary result)   Collection Time: 01/16/18  7:42 PM  Result Value Ref Range Status   Specimen Description BLOOD BLOOD RIGHT HAND  Final   Special Requests   Final    BOTTLES DRAWN AEROBIC AND ANAEROBIC Blood Culture adequate volume   Culture   Final    NO GROWTH < 24 HOURS Performed at [facility], [place] [address] [place], [place] [address]    Report Status PENDING  Incomplete  Respiratory Panel by PCR     Status: Abnormal   Collection Time: 01/16/18  8:55 PM  Result Value Ref Range Status   Adenovirus NOT DETECTED NOT DETECTED Final   Coronavirus 229E NOT DETECTED NOT DETECTED Final   Coronavirus HKU1 NOT DETECTED NOT DETECTED Final   Coronavirus NL63 NOT DETECTED NOT DETECTED Final   Coronavirus OC43 NOT DETECTED NOT DETECTED Final   Metapneumovirus NOT DETECTED NOT DETECTED Final   Rhinovirus / Enterovirus DETECTED (A) NOT DETECTED Final   Influenza A NOT DETECTED NOT DETECTED Final   Influenza B NOT DETECTED NOT DETECTED Final   Parainfluenza Virus 1 NOT DETECTED NOT DETECTED Final   Parainfluenza Virus 2 NOT DETECTED NOT DETECTED Final   Parainfluenza Virus 3 NOT DETECTED NOT DETECTED Final   Parainfluenza Virus 4 NOT DETECTED NOT DETECTED Final   Respiratory Syncytial Virus NOT DETECTED NOT DETECTED Final   Bordetella pertussis NOT DETECTED NOT DETECTED Final   Chlamydophila pneumoniae NOT DETECTED NOT DETECTED Final   Mycoplasma pneumoniae NOT DETECTED NOT DETECTED Final  MRSA PCR Screening     Status: None   Collection  Time: 01/17/18  3:33 AM  Result Value Ref Range Status   MRSA by PCR NEGATIVE NEGATIVE Final    Comment:        The GeneXpert MRSA Assay (FDA approved for NASAL specimens only), is one component of a comprehensive MRSA colonization surveillance program. It is not intended to diagnose MRSA infection nor to guide or monitor treatment for MRSA infections. Performed at [facility], [place] [address], [place], [place] [address]   Culture, sputum-assessment     Status: None   Collection Time: 01/17/18  3:56 AM  Result Value Ref Range Status   Specimen Description SPUTUM  Final   Special Requests   Final    NONE Performed at [facility], [place] [address], [place], [place] [address]    Sputum evaluation   Final    Sputum specimen not acceptable for testing.  Please recollect.   RESULT CALLED TO, READ BACK BY AND VERIFIED WITH: RN Boyd Kerbs [phone] FCP    Report Status 01/17/2018 FINAL  Final  Expectorated sputum assessment w rflx to resp cult     Status: None   Collection Time: 01/17/18 12:49 PM  Result Value Ref Range Status   Specimen Description SPUTUM  Final   Special Requests Normal  Final   Sputum evaluation   Final    RARE WBC PRESENT, PREDOMINANTLY PMN RARE SQUAMOUS EPITHELIAL CELLS PRESENT FEW GRAM POSITIVE COCCI FEW GRAM NEGATIVE RODS THIS SPECIMEN IS ACCEPTABLE FOR SPUTUM CULTURE Performed at [facility], [place] [address], [place], [place] [address]    Report Status 01/17/2018 FINAL  Final  Culture, respiratory     Status: None (Preliminary result)   Collection Time: 01/17/18 12:49 PM  Result Value Ref Range Status   Specimen Description SPUTUM  Final   Special Requests Normal Reflexed from [id number]  Final   Gram Stain   Final    RARE WBC PRESENT, PREDOMINANTLY PMN NO SQUAMOUS EPITHELIAL CELLS SEEN FEW GRAM POSITIVE COCCI RARE GRAM NEGATIVE RODS RARE GRAM POSITIVE RODS Performed at [facility], [place] [address], [place],   [address]    Culture PENDING  Incomplete   Report Status PENDING  Incomplete      Studies: No results found.  Scheduled Meds: . aspirin EC  81 mg Oral Daily  . atorvastatin  40 mg Oral Daily  . azithromycin  250 mg Oral Daily  . cinacalcet  90 mg Oral Q breakfast  . [START ON 01/19/2018] diphenhydrAMINE  50 mg Oral Q T,Th,Sa-HD  . heparin  5,000 Units Subcutaneous Q8H  . insulin aspart  0-9 Units Subcutaneous TID WC  . insulin glargine  4 Units Subcutaneous QHS  . ipratropium-albuterol  3 mL Nebulization BID  . midodrine  10 mg Oral TID  . multivitamin with minerals  1 tablet Oral Daily  . [START ON 01/19/2018] pentafluoroprop-tetrafluoroeth  1 application Topical Q T,Th,Sa-HD  . predniSONE  40 mg Oral Q breakfast  . sevelamer carbonate  2,400 mg Oral TID WC    Continuous Infusions:   LOS: 0 days     Desiree Hane, MD [facility] Pager [phone]  If 7PM-7AM, please contact night-coverage [email or web address] Password [email or web address] 01/18/2018, 7:55 AM

## 2018-01-19 DIAGNOSIS — Z992 Dependence on renal dialysis: Secondary | ICD-10-CM | POA: Diagnosis not present

## 2018-01-19 DIAGNOSIS — N186 End stage renal disease: Secondary | ICD-10-CM | POA: Diagnosis not present

## 2018-01-19 DIAGNOSIS — E1129 Type 2 diabetes mellitus with other diabetic kidney complication: Secondary | ICD-10-CM | POA: Diagnosis not present

## 2018-01-19 LAB — RENAL FUNCTION PANEL
Albumin: 2.8 g/dL — ABNORMAL LOW (ref 3.5–5.0)
Anion gap: 15 (ref 5–15)
BUN: 102 mg/dL — ABNORMAL HIGH (ref 8–23)
CO2: 22 mmol/L (ref 22–32)
Calcium: 8.3 mg/dL — ABNORMAL LOW (ref 8.9–10.3)
Chloride: 97 mmol/L — ABNORMAL LOW (ref 98–111)
Creatinine, Ser: 12.22 mg/dL — ABNORMAL HIGH (ref 0.44–1.00)
GFR calc Af Amer: 3 mL/min — ABNORMAL LOW (ref >60)
GFR calc non Af Amer: 3 mL/min — ABNORMAL LOW (ref >60)
Glucose, Bld: 327 mg/dL — ABNORMAL HIGH (ref 70–99)
Phosphorus: 3.5 mg/dL (ref 2.5–4.6)
Potassium: 4.7 mmol/L (ref 3.5–5.1)
Sodium: 134 mmol/L — ABNORMAL LOW (ref 135–145)

## 2018-01-19 LAB — CBC
HCT: 31.6 % — ABNORMAL LOW (ref 36.0–46.0)
Hemoglobin: 10.1 g/dL — ABNORMAL LOW (ref 12.0–15.0)
MCH: 29.5 pg (ref 26.0–34.0)
MCHC: 32 g/dL (ref 30.0–36.0)
MCV: 92.4 fL (ref 80.0–100.0)
Platelets: 251 10*3/uL (ref 150–400)
RBC: 3.42 MIL/uL — ABNORMAL LOW (ref 3.87–5.11)
RDW: 16.1 % — ABNORMAL HIGH (ref 11.5–15.5)
WBC: 14.1 10*3/uL — ABNORMAL HIGH (ref 4.0–10.5)
nRBC: 0 % (ref 0.0–0.2)

## 2018-01-19 LAB — GLUCOSE, CAPILLARY
GLUCOSE-CAPILLARY: 181 mg/dL — AB (ref 70–99)
Glucose-Capillary: 302 mg/dL — ABNORMAL HIGH (ref 70–99)
Glucose-Capillary: 376 mg/dL — ABNORMAL HIGH (ref 70–99)

## 2018-01-19 MED ORDER — HEPARIN SODIUM (PORCINE) 1000 UNIT/ML IJ SOLN
INTRAMUSCULAR | Status: AC
  Filled 2018-01-19: qty 13

## 2018-01-19 MED ORDER — SODIUM CHLORIDE 0.9 % IV SOLN: 100.0000 mL | INTRAVENOUS | Status: DC | PRN

## 2018-01-19 MED ORDER — LIDOCAINE-PRILOCAINE 2.5-2.5 % EX CREA
1.0000 "application " | TOPICAL_CREAM | CUTANEOUS | Status: DC | PRN
  Filled 2018-01-19: qty 5

## 2018-01-19 MED ORDER — PENTAFLUOROPROP-TETRAFLUOROETH EX AERO: 1.0000 "application " | INHALATION_SPRAY | CUTANEOUS | Status: DC | PRN

## 2018-01-19 MED ORDER — GUAIFENESIN ER 600 MG PO TB12
1200.0000 mg | ORAL_TABLET | Freq: Two times a day (BID) | ORAL | Status: DC
  Administered 2018-01-19 – 2018-01-24 (×10): 1200 mg via ORAL
  Filled 2018-01-19 (×10): qty 2

## 2018-01-19 MED ORDER — SEVELAMER CARBONATE 800 MG PO TABS
1600.0000 mg | ORAL_TABLET | Freq: Three times a day (TID) | ORAL | Status: DC
  Administered 2018-01-19 – 2018-01-24 (×12): 1600 mg via ORAL
  Filled 2018-01-19 (×12): qty 2

## 2018-01-19 MED ORDER — ALTEPLASE 2 MG IJ SOLR: 2.0000 mg | Freq: Once | INTRAMUSCULAR | Status: DC | PRN

## 2018-01-19 MED ORDER — LIDOCAINE HCL (PF) 1 % IJ SOLN: 5.0000 mL | INTRAMUSCULAR | Status: DC | PRN

## 2018-01-19 MED ORDER — CALCITRIOL 0.5 MCG PO CAPS
ORAL_CAPSULE | ORAL | Status: AC
  Filled 2018-01-19: qty 5

## 2018-01-19 MED ORDER — METHYLPREDNISOLONE SODIUM SUCC 125 MG IJ SOLR
60.0000 mg | Freq: Three times a day (TID) | INTRAMUSCULAR | Status: DC
  Administered 2018-01-19 – 2018-01-21 (×6): 60 mg via INTRAVENOUS
  Filled 2018-01-19 (×6): qty 2

## 2018-01-19 MED ORDER — HEPARIN SODIUM (PORCINE) 1000 UNIT/ML DIALYSIS
1000.0000 [IU] | INTRAMUSCULAR | Status: DC | PRN
  Filled 2018-01-19: qty 1

## 2018-01-19 MED ORDER — MIDODRINE HCL 5 MG PO TABS
ORAL_TABLET | ORAL | Status: AC
  Filled 2018-01-19: qty 2

## 2018-01-19 MED ORDER — HEPARIN SODIUM (PORCINE) 1000 UNIT/ML DIALYSIS
100.0000 [IU]/kg | INTRAMUSCULAR | Status: DC | PRN
  Administered 2018-01-19: 12800 [IU] via INTRAVENOUS_CENTRAL
  Filled 2018-01-19 (×2): qty 13

## 2018-01-19 NOTE — Progress Notes (Signed)
RT Note:  Patient stated that she would have RN call when she is ready to be placed on CPAP.

## 2018-01-19 NOTE — Progress Notes (Signed)
PROGRESS NOTE    [name]  QVZ:[id number] DOB: 02-19-1945 DOA: 01/16/2018 PCP: [name], MD   Brief Narrative:   [name] is a 72 y.o. year old female with medical history significant for ESRD on HD TThSat, HTN, type 2 diabetes, diastolic CHF, CAD, OSA on CPAP, former smoker, HLD and other comorbids who presented on 01/16/2018 with 2 to 3 days of worsening nonproductive cough and shortness of breath with no improvement with home inhaler regimen and was found to have acute bronchitis/COPD exacerbation secondary to rhinovirus infection. She continues to wheeze significantly but feels as if her breathing is improving.  Assessment & Plan:   Principal Problem:   COPD exacerbation ([place]) Active Problems:   OSA on CPAP   Coronary artery disease   ESRD needing dialysis ([place])   Type II diabetes mellitus with renal manifestations (HCC)   Chest pain   Chronic diastolic CHF (congestive heart failure) (HCC)   Hypotension   Acute bronchitis   Essential hypertension   HLD (hyperlipidemia)   Acute bronchitis due to Rhinovirus   Respiratory distress, acute  Acute Bronchitis/COPD flare secondary to Rhinovirus.  -Still has some dyspnea, diffuse coarse wheezing despite recent breathing treatment.  -Needs to stay for inpatient course due to significant respiratory distress from the virus exacerbating her COPD. -Will discontinue prednisone for IV Solumedrol and increase scheduled duo-nebs (from BID to q6 H) -PRN albuterol nebs and will place the patient on guaifenesin 1200 mg p.o. twice daily, flutter valve, incentive spirometer -Nebs 3 mils every 6 scheduled and continue with Solu-Medrol 40 g IV every 12 but will increase to 60 mg q8h -Continue with Incruse Ellipta -C/w Benzonatate 200 mg po TID -Repeat CXR in AM  -If not improving will consider placing on Budesonide and obtaining a Pulmonary evaluation  ESRD on HD -Adherent with regimen.  On Tuesday Thursday Saturday schedule,  nephrology following and appreciate further evaluation and recommendations  -BUN/Cr was 102/12.22 and patient went for Dialysis today   Secondary Hyperparathyroidism. -Phos was 3.5 -Continue home Renvela and Cinacalcet  Bone mineral disease related to ESRD.   -Continue home calcitriol with HD.  Chronic Anemia and Anemia of CKD.   -Continue Aranesp with dialysis.   -Hb/Hct went from 10.3/32.9 -> 10.1/31.6 -Continuet to Monitor CBC  Hypertension, at goal.   -Prone to low BP at dialysis.  Continue home Midodrine. -Have not needed to resume home carvedilol (12.5 twice daily).  Hyperlipidemia -Stable.   -Continue Atorvastatin  Type 2 Diabetes  -A1c 5.7 (12/19).   -Monitor CBGs. CBGs ranging from 181-302 -Continue home Lantus for units (decreased dose).   -May need to increase given start IV steroids, will monitor.  Morbid Obesity  -Estimated body mass index is 45.37 kg/m as calculated from the following:   Height as of this encounter: 5\' 6"  (1.676 m).   Weight as of this encounter: 127.5 kg. -Weight Loss Counseling given  DVT prophylaxis: Heparin 5,000 sq q8h Code Status: FULL CODE Family Communication: No family present at bedside Disposition Plan: Remain Inpatient for continued workup and treatment   Consultants:   Nephrology    Procedures:  Dialysis    Antimicrobials:  Anti-infectives (From admission, onward)   Start     Dose/Rate Route Frequency Ordered Stop   01/18/18 1000  azithromycin (ZITHROMAX) tablet 250 mg     250 mg Oral Daily 01/16/18 2048 01/22/18 0959   01/17/18 1000  azithromycin (ZITHROMAX) tablet 500 mg     500 mg Oral  Daily 01/16/18 2048 01/17/18 0811   01/16/18 2000  vancomycin (VANCOCIN) 2,000 mg in sodium chloride 0.9 % 500 mL IVPB     2,000 mg 250 mL/hr over 120 Minutes Intravenous  Once 01/16/18 1953 01/16/18 2315   01/16/18 1945  ceFEPIme (MAXIPIME) 2 g in sodium chloride 0.9 % 100 mL IVPB     2 g 200 mL/hr over 30 Minutes  Intravenous  Once 01/16/18 1933 01/16/18 2056     Subjective: Phylliss Bob in dialysis and states her breathing is little bit better.  Still had significant wheezing and some dyspnea on speaking.  No chest pain, lightheadedness or dizziness but does state that she has had some abdominal pain from coughing.  No other concerns or complaints at this time  Objective: Vitals:   01/19/18 1130 01/19/18 1147 01/19/18 1433 01/19/18 1436  BP: 113/60 (!) 102/44 (!) 99/51   Pulse: 61 63  73  Resp: 19 20  (!) 21  Temp:  97.7 F (36.5 C) 98.3 F (36.8 C)   TempSrc:  Oral Oral   SpO2: 95% 96% 95% 95%  Weight:  127.5 kg    Height:        Intake/Output Summary (Last 24 hours) at 01/19/2018 1844 Last data filed at 01/19/2018 1147 Gross per 24 hour  Intake 100 ml  Output 3500 ml  Net -3400 ml   Filed Weights   01/19/18 0543 01/19/18 0700 01/19/18 1147  Weight: 130.6 kg 130.8 kg 127.5 kg   Examination: Physical Exam:  Constitutional: WN/WD obese AAF in NAD and appears calm but uncomfortable Eyes: Lids and conjunctivae normal, sclerae anicteric  ENMT: External Ears, Nose appear normal. Grossly normal hearing. Mucous membranes are moist.   Neck: Appears normal, supple, no cervical masses, normal ROM, no appreciable thyromegaly; no JVD Respiratory: Diminished to auscultation bilaterally, no wheezing, rales, rhonchi or crackles. Normal respiratory effort and patient is not tachypenic. No accessory muscle use.  Cardiovascular: RRR, no murmurs / rubs / gallops. S1 and S2 auscultated. Trace extremity edema.  Abdomen: Soft, non-tender, Distended 2/2 body habitus. No masses palpated. No appreciable hepatosplenomegaly. Bowel sounds positive x4.  GU: Deferred. Musculoskeletal: No clubbing / cyanosis of digits/nails. No joint deformity upper and lower extremities.  Skin: No rashes, lesions, ulcers. No induration; Warm and dry.Getting Dialysis through Left Upper Arm Fistula.  Neurologic: CN 2-12 grossly  intact with no focal deficits.  Romberg sign and cerebellar reflexes not assessed.  Psychiatric: Normal judgment and insight. Alert and oriented x 3. Normal mood and appropriate affect.   Data Reviewed: I have personally reviewed following labs and imaging studies  CBC: Recent Labs  Lab 01/16/18 1822 01/16/18 1846 01/18/18 0402 01/19/18 0742  WBC 10.1  --  15.0* 14.1*  NEUTROABS 5.8  --   --   --   HGB 10.9* 12.2 10.3* 10.1*  HCT 35.8* 36.0 32.9* 31.6*  MCV 94.2  --  91.9 92.4  PLT 183  --  222 347   Basic Metabolic Panel: Recent Labs  Lab 01/16/18 1822 01/16/18 1846 01/18/18 0402 01/19/18 0742  NA 141 141 137 134*  K 4.2 4.1 5.1 4.7  CL 97* 99 95* 97*  CO2 28  --  25 22  GLUCOSE 106* 102* 330* 327*  BUN 29* 34* 68* 102*  CREATININE 6.97* 7.00* 10.21* 12.22*  CALCIUM 8.9  --  8.8* 8.3*  PHOS  --   --   --  3.5   GFR: Estimated Creatinine Clearance: 5.6 mL/min (A) (by  C-G formula based on SCr of 12.22 mg/dL (H)). Liver Function Tests: Recent Labs  Lab 01/19/18 0742  ALBUMIN 2.8*   No results for input(s): LIPASE, AMYLASE in the last 168 hours. No results for input(s): AMMONIA in the last 168 hours. Coagulation Profile: Recent Labs  Lab 01/16/18 1822  INR 1.13   Cardiac Enzymes: No results for input(s): CKTOTAL, CKMB, CKMBINDEX, TROPONINI in the last 168 hours. BNP (last 3 results) No results for input(s): PROBNP in the last 8760 hours. HbA1C: Recent Labs    01/18/18 0402  HGBA1C 5.7*   CBG: Recent Labs  Lab 01/18/18 1132 01/18/18 1640 01/18/18 2153 01/19/18 1327 01/19/18 1626  GLUCAP 279* 187* 287* 181* 302*   Lipid Profile: No results for input(s): CHOL, HDL, LDLCALC, TRIG, CHOLHDL, LDLDIRECT in the last 72 hours. Thyroid Function Tests: No results for input(s): TSH, T4TOTAL, FREET4, T3FREE, THYROIDAB in the last 72 hours. Anemia Panel: No results for input(s): VITAMINB12, FOLATE, FERRITIN, TIBC, IRON, RETICCTPCT in the last 72  hours. Sepsis Labs: Recent Labs  Lab 01/16/18 1847 01/16/18 2012  LATICACIDVEN 1.26 1.47    Recent Results (from the past 240 hour(s))  Blood culture (routine x 2)     Status: None (Preliminary result)   Collection Time: 01/16/18  7:38 PM  Result Value Ref Range Status   Specimen Description BLOOD BLOOD RIGHT WRIST  Final   Special Requests   Final    BOTTLES DRAWN AEROBIC AND ANAEROBIC Blood Culture adequate volume   Culture   Final    NO GROWTH 3 DAYS Performed at [facility], [place] [address] [place], [place] [address]    Report Status PENDING  Incomplete  Blood culture (routine x 2)     Status: None (Preliminary result)   Collection Time: 01/16/18  7:42 PM  Result Value Ref Range Status   Specimen Description BLOOD BLOOD RIGHT HAND  Final   Special Requests   Final    BOTTLES DRAWN AEROBIC AND ANAEROBIC Blood Culture adequate volume   Culture   Final    NO GROWTH 3 DAYS Performed at [facility], [place] [address], [place],  [address]    Report Status PENDING  Incomplete  Respiratory Panel by PCR     Status: Abnormal   Collection Time: 01/16/18  8:55 PM  Result Value Ref Range Status   Adenovirus NOT DETECTED NOT DETECTED Final   Coronavirus 229E NOT DETECTED NOT DETECTED Final   Coronavirus HKU1 NOT DETECTED NOT DETECTED Final   Coronavirus NL63 NOT DETECTED NOT DETECTED Final   Coronavirus OC43 NOT DETECTED NOT DETECTED Final   Metapneumovirus NOT DETECTED NOT DETECTED Final   Rhinovirus / Enterovirus DETECTED (A) NOT DETECTED Final   Influenza A NOT DETECTED NOT DETECTED Final   Influenza B NOT DETECTED NOT DETECTED Final   Parainfluenza Virus 1 NOT DETECTED NOT DETECTED Final   Parainfluenza Virus 2 NOT DETECTED NOT DETECTED Final   Parainfluenza Virus 3 NOT DETECTED NOT DETECTED Final   Parainfluenza Virus 4 NOT DETECTED NOT DETECTED Final   Respiratory Syncytial Virus NOT DETECTED NOT DETECTED Final   Bordetella pertussis NOT DETECTED NOT  DETECTED Final   Chlamydophila pneumoniae NOT DETECTED NOT DETECTED Final   Mycoplasma pneumoniae NOT DETECTED NOT DETECTED Final  MRSA PCR Screening     Status: None   Collection Time: 01/17/18  3:33 AM  Result Value Ref Range Status   MRSA by PCR NEGATIVE NEGATIVE Final    Comment:  The GeneXpert MRSA Assay (FDA approved for NASAL specimens only), is one component of a comprehensive MRSA colonization surveillance program. It is not intended to diagnose MRSA infection nor to guide or monitor treatment for MRSA infections. Performed at [facility], [place] [address], [place], [place] [address]   Culture, sputum-assessment     Status: None   Collection Time: 01/17/18  3:56 AM  Result Value Ref Range Status   Specimen Description SPUTUM  Final   Special Requests   Final    NONE Performed at [facility], [place] [address], [place], [place] [address]    Sputum evaluation   Final    Sputum specimen not acceptable for testing.  Please recollect.   RESULT CALLED TO, READ BACK BY AND VERIFIED WITH: RN Boyd Kerbs [phone] FCP    Report Status 01/17/2018 FINAL  Final  Expectorated sputum assessment w rflx to resp cult     Status: None   Collection Time: 01/17/18 12:49 PM  Result Value Ref Range Status   Specimen Description SPUTUM  Final   Special Requests Normal  Final   Sputum evaluation   Final    RARE WBC PRESENT, PREDOMINANTLY PMN RARE SQUAMOUS EPITHELIAL CELLS PRESENT FEW GRAM POSITIVE COCCI FEW GRAM NEGATIVE RODS THIS SPECIMEN IS ACCEPTABLE FOR SPUTUM CULTURE Performed at [facility], [place] [address] [place], [place] [address]    Report Status 01/17/2018 FINAL  Final  Culture, respiratory     Status: None (Preliminary result)   Collection Time: 01/17/18 12:49 PM  Result Value Ref Range Status   Specimen Description SPUTUM  Final   Special Requests Normal Reflexed from [id number]  Final   Gram Stain   Final    RARE WBC PRESENT, PREDOMINANTLY  PMN NO SQUAMOUS EPITHELIAL CELLS SEEN FEW GRAM POSITIVE COCCI RARE GRAM NEGATIVE RODS RARE GRAM POSITIVE RODS    Culture   Final    FEW Consistent with normal respiratory flora. Performed at [facility], [place] [address] [place], [place] [address]    Report Status PENDING  Incomplete    Radiology Studies: No results found.  Scheduled Meds: . aspirin EC  81 mg Oral Daily  . atorvastatin  40 mg Oral Daily  . azithromycin  250 mg Oral Daily  . [START ON 01/21/2018] calcitRIOL  2.5 mcg Oral Q T,Th,Sa-HD  . Chlorhexidine Gluconate Cloth  6 each Topical Q0600  . cinacalcet  90 mg Oral Q breakfast  . [START ON 01/21/2018] darbepoetin (ARANESP) injection - DIALYSIS  60 mcg Intravenous Q Thu-HD  . dextromethorphan-guaiFENesin  1 tablet Oral BID  . diphenhydrAMINE  50 mg Oral Q T,Th,Sa-HD  . heparin  5,000 Units Subcutaneous Q8H  . insulin aspart  0-9 Units Subcutaneous TID WC  . insulin glargine  4 Units Subcutaneous QHS  . ipratropium-albuterol  3 mL Nebulization Q6H  . methylPREDNISolone (SOLU-MEDROL) injection  40 mg Intravenous Q12H  . midodrine  10 mg Oral TID  . multivitamin with minerals  1 tablet Oral Daily  . pentafluoroprop-tetrafluoroeth  1 application Topical Q T,Th,Sa-HD  . sevelamer carbonate  1,600 mg Oral TID WC   Continuous Infusions:   LOS: 1 day   Kerney Elbe, DO [facility] PAGER is on [place]  If 7PM-7AM, please contact night-coverage [email or web address] Password [facility] 01/19/2018, 6:44 PM

## 2018-01-19 NOTE — Progress Notes (Addendum)
[facility] Progress Note   Subjective: Seen on HD. Still with cough, scattered exp wheezes-just had breathing tx. Says she still feels "pretty rough".      Objective Vitals:   01/19/18 0800 01/19/18 0821 01/19/18 0830 01/19/18 0900  BP: 135/64  (!) 143/68 131/65  Pulse: (!) 59 (!) 57 (!) 53 (!) 55  Resp: 20 19 17 18   Temp:      TempSrc:      SpO2: 94% 97% 94% 97%  Weight:      Height:       Physical Exam General: Pleasant, obese elderly female in NAD Heart: S1,S2 RRR  Lungs: Bilateral breath sounds, coarse upper airway rhonchi, inspiratory wheezes. Non-productive cough. Abdomen: Obese, active BS Extremities: Trace BLE edema Dialysis Access: LUA AVF cannulated at present  Additional Objective Labs: Basic Metabolic Panel: Recent Labs  Lab 01/16/18 1822 01/16/18 1846 01/18/18 0402 01/19/18 0742  NA 141 141 137 134*  K 4.2 4.1 5.1 4.7  CL 97* 99 95* 97*  CO2 28  --  25 22  GLUCOSE 106* 102* 330* 327*  BUN 29* 34* 68* 102*  CREATININE 6.97* 7.00* 10.21* 12.22*  CALCIUM 8.9  --  8.8* 8.3*  PHOS  --   --   --  3.5   Liver Function Tests: Recent Labs  Lab 01/19/18 0742  ALBUMIN 2.8*   No results for input(s): LIPASE, AMYLASE in the last 168 hours. CBC: Recent Labs  Lab 01/16/18 1822 01/16/18 1846 01/18/18 0402 01/19/18 0742  WBC 10.1  --  15.0* 14.1*  NEUTROABS 5.8  --   --   --   HGB 10.9* 12.2 10.3* 10.1*  HCT 35.8* 36.0 32.9* 31.6*  MCV 94.2  --  91.9 92.4  PLT 183  --  222 251   Blood Culture    Component Value Date/Time   SDES SPUTUM 01/17/2018 1249   SDES SPUTUM 01/17/2018 1249   SPECREQUEST Normal 01/17/2018 1249   SPECREQUEST Normal Reflexed from [id number] 01/17/2018 1249   CULT CULTURE REINCUBATED FOR BETTER GROWTH 01/17/2018 1249   REPTSTATUS 01/17/2018 FINAL 01/17/2018 1249   REPTSTATUS PENDING 01/17/2018 1249    Cardiac Enzymes: No results for input(s): CKTOTAL, CKMB, CKMBINDEX, TROPONINI in the last 168  hours. CBG: Recent Labs  Lab 01/17/18 2052 01/18/18 0743 01/18/18 1132 01/18/18 1640 01/18/18 2153  GLUCAP 255* 196* 279* 187* 287*   Iron Studies: No results for input(s): IRON, TIBC, TRANSFERRIN, FERRITIN in the last 72 hours. @lablastinr3 @ Studies/Results: No results found. Medications: . sodium chloride    . sodium chloride     . calcitRIOL      . aspirin EC  81 mg Oral Daily  . atorvastatin  40 mg Oral Daily  . azithromycin  250 mg Oral Daily  . calcitRIOL  2.5 mcg Oral Once in dialysis  . [START ON 01/21/2018] calcitRIOL  2.5 mcg Oral Q T,Th,Sa-HD  . Chlorhexidine Gluconate Cloth  6 each Topical Q0600  . cinacalcet  90 mg Oral Q breakfast  . [START ON 01/21/2018] darbepoetin (ARANESP) injection - DIALYSIS  60 mcg Intravenous Q Thu-HD  . dextromethorphan-guaiFENesin  1 tablet Oral BID  . diphenhydrAMINE  50 mg Oral Q T,Th,Sa-HD  . heparin      . heparin  5,000 Units Subcutaneous Q8H  . insulin aspart  0-9 Units Subcutaneous TID WC  . insulin glargine  4 Units Subcutaneous QHS  . ipratropium-albuterol  3 mL Nebulization Q6H  . methylPREDNISolone (SOLU-MEDROL) injection  40  mg Intravenous Q12H  . midodrine      . midodrine  10 mg Oral TID  . multivitamin with minerals  1 tablet Oral Daily  . pentafluoroprop-tetrafluoroeth  1 application Topical Q T,Th,Sa-HD  . sevelamer carbonate  2,400 mg Oral TID WC     Dialysis Orders: [place] T,Th,S 4.5 hrs 180NRe 450/Autoflow 1.5 125.5 kg 2.0 K/ 2.0 Ca  LUA AVF -Heparin 13,000 units IV TIW -Mircera 60 mcg IV q 2 weeks (last dose 01/07/18 Last HGB 10.1 Tsat 14 Iron 28 01/14/18) -Calcitriol 2.5 mcg PO TIW (last PTH 511 Ca 8.9 C Ca 9.1 Phos 3.8 01/14/18)  Assessment/Plan: 1. Exacerbation of COPD - per primary + rhinovirus/enterovirus/ steroids/breathing treatment/azithromycin - former smoker, retired Lawyer.  2. ESRD -TTS  HD today on hospital schedule. K+ 4.7 2.0 K bath. Usual heparin 3. Anemia - hgb 10.1 - if hgb  drops can give short course IV Fe even with high ferritin - continue Aranesp 60 q week 4. Secondary hyperparathyroidism - CA 8.3 C Ca 8.7 Phos 2.8. Continue sensipar, decrease binders.  5. HTN/volume - UFG 5.0 SBP 112-may not tolerate this goal-lower UFG to 4.5 6. Nutrition -Albumin 3.5 Renal/Carb mod diet, nepro, renal vits  7. DM - per primary   Rita H. Brown NP-C 01/19/2018, 9:28 AM  [employer] [phone]  I have seen and examined this patient and agree with the plan of care. Seen on HD 2/2 114/52 UF 4.7L net as tolerated Will need HD again tomorrow to at minimum get to EDW and gently challenge EDW before attributing all the dyspnea to a pulmonary process. If we can get to or below EDW then will dialyze Fri or sat (usually TTS as outpt).  Dwana Melena, MD 01/19/2018, 10:03 AM

## 2018-01-19 NOTE — Progress Notes (Signed)
Inpatient Diabetes Program Recommendations  AACE/ADA: New Consensus Statement on Inpatient Glycemic Control (2015)  Target Ranges:  Prepandial:   less than 140 mg/dL      Peak postprandial:   less than 180 mg/dL (1-2 hours)      Critically ill patients:  140 - 180 mg/dL   Results for [name], [name] (MRN [id number]) as of 01/19/2018 09:57  Ref. Range 01/18/2018 07:43 01/18/2018 11:32 01/18/2018 16:40 01/18/2018 21:53  Glucose-Capillary Latest Ref Range: 70 - 99 mg/dL 196 (H)  2 units NOVOLOG  279 (H)  5 units NOVOLOG  187 (H)  2 units NOVOLOG  287 (H)     4 units LANTUS   Results for [name], [name] (MRN [id number]) as of 01/19/2018 09:57  Ref. Range 01/19/2018 07:42  Glucose Latest Ref Range: 70 - 99 mg/dL 327 (H)    Admit with: COPD exacerbation vs. Bronchitis  History: DM, ESRD, CHF  Home DM Meds: Lantus 6 units QHS  Current Orders: Lantus 4 units QHS      Novolog Sensitive Correction Scale/ SSI (0-9 units) TID AC      Solumedrol reduced to 40 mg BID.  Getting Dialysis today.    MD- Please consider the following in-hospital insulin adjustments:  1. Increase Lantus to 6 units QHS (home dose)  2. Start low dose Meal Coverage: Novolog 3 units TID with meals  (Please add the following Hold Parameters: Hold if pt eats <50% of meal, Hold if pt NPO)      --Will follow patient during hospitalization--  Wyn Quaker RN, MSN, CDE Diabetes Coordinator Inpatient Glycemic Control Team Team Pager: [phone] (8a-5p)

## 2018-01-20 ENCOUNTER — Inpatient Hospital Stay (HOSPITAL_COMMUNITY): Payer: Medicare HMO

## 2018-01-20 DIAGNOSIS — J441 Chronic obstructive pulmonary disease with (acute) exacerbation: Principal | ICD-10-CM

## 2018-01-20 LAB — COMPREHENSIVE METABOLIC PANEL
ALK PHOS: 66 U/L (ref 38–126)
ALT: 14 U/L (ref 0–44)
ANION GAP: 10 (ref 5–15)
AST: 20 U/L (ref 15–41)
Albumin: 3 g/dL — ABNORMAL LOW (ref 3.5–5.0)
BUN: 53 mg/dL — ABNORMAL HIGH (ref 8–23)
CO2: 29 mmol/L (ref 22–32)
Calcium: 9 mg/dL (ref 8.9–10.3)
Chloride: 95 mmol/L — ABNORMAL LOW (ref 98–111)
Creatinine, Ser: 7.25 mg/dL — ABNORMAL HIGH (ref 0.44–1.00)
GFR calc Af Amer: 6 mL/min — ABNORMAL LOW (ref >60)
GFR calc non Af Amer: 5 mL/min — ABNORMAL LOW (ref >60)
Glucose, Bld: 351 mg/dL — ABNORMAL HIGH (ref 70–99)
Potassium: 5.1 mmol/L (ref 3.5–5.1)
SODIUM: 134 mmol/L — AB (ref 135–145)
Total Bilirubin: 0.7 mg/dL (ref 0.3–1.2)
Total Protein: 7.4 g/dL (ref 6.5–8.1)

## 2018-01-20 LAB — CBC WITH DIFFERENTIAL/PLATELET
Abs Immature Granulocytes: 0.22 10*3/uL — ABNORMAL HIGH (ref 0.00–0.07)
Basophils Absolute: 0 10*3/uL (ref 0.0–0.1)
Basophils Relative: 0 %
Eosinophils Absolute: 0 10*3/uL (ref 0.0–0.5)
Eosinophils Relative: 0 %
HCT: 33.4 % — ABNORMAL LOW (ref 36.0–46.0)
Hemoglobin: 10.7 g/dL — ABNORMAL LOW (ref 12.0–15.0)
Immature Granulocytes: 2 %
LYMPHS PCT: 11 %
Lymphs Abs: 1.3 10*3/uL (ref 0.7–4.0)
MCH: 29.4 pg (ref 26.0–34.0)
MCHC: 32 g/dL (ref 30.0–36.0)
MCV: 91.8 fL (ref 80.0–100.0)
Monocytes Absolute: 0.6 10*3/uL (ref 0.1–1.0)
Monocytes Relative: 5 %
Neutro Abs: 9.7 10*3/uL — ABNORMAL HIGH (ref 1.7–7.7)
Neutrophils Relative %: 82 %
Platelets: 249 10*3/uL (ref 150–400)
RBC: 3.64 MIL/uL — ABNORMAL LOW (ref 3.87–5.11)
RDW: 16.2 % — ABNORMAL HIGH (ref 11.5–15.5)
WBC: 11.8 10*3/uL — AB (ref 4.0–10.5)
nRBC: 0 % (ref 0.0–0.2)

## 2018-01-20 LAB — GLUCOSE, CAPILLARY
GLUCOSE-CAPILLARY: 226 mg/dL — AB (ref 70–99)
Glucose-Capillary: 145 mg/dL — ABNORMAL HIGH (ref 70–99)
Glucose-Capillary: 273 mg/dL — ABNORMAL HIGH (ref 70–99)

## 2018-01-20 LAB — MAGNESIUM: Magnesium: 2.4 mg/dL (ref 1.7–2.4)

## 2018-01-20 LAB — CULTURE, RESPIRATORY W GRAM STAIN
Culture: NORMAL
Special Requests: NORMAL

## 2018-01-20 LAB — PHOSPHORUS: Phosphorus: 4.3 mg/dL (ref 2.5–4.6)

## 2018-01-20 MED ORDER — CHLORHEXIDINE GLUCONATE CLOTH 2 % EX PADS: 6.0000 | MEDICATED_PAD | Freq: Every day | CUTANEOUS | Status: DC

## 2018-01-20 MED ORDER — ARFORMOTEROL TARTRATE 15 MCG/2ML IN NEBU
15.0000 ug | INHALATION_SOLUTION | Freq: Two times a day (BID) | RESPIRATORY_TRACT | Status: DC
  Administered 2018-01-20 – 2018-01-24 (×6): 15 ug via RESPIRATORY_TRACT
  Filled 2018-01-20 (×6): qty 2

## 2018-01-20 MED ORDER — BUDESONIDE 0.25 MG/2ML IN SUSP
0.2500 mg | Freq: Two times a day (BID) | RESPIRATORY_TRACT | Status: DC
  Administered 2018-01-20 – 2018-01-24 (×6): 0.25 mg via RESPIRATORY_TRACT
  Filled 2018-01-20 (×8): qty 2

## 2018-01-20 MED ORDER — AZITHROMYCIN 250 MG PO TABS
500.0000 mg | ORAL_TABLET | Freq: Every day | ORAL | Status: AC
  Administered 2018-01-20 – 2018-01-21 (×2): 500 mg via ORAL
  Filled 2018-01-20: qty 2
  Filled 2018-01-20: qty 1

## 2018-01-20 MED ORDER — HEPARIN SODIUM (PORCINE) 1000 UNIT/ML IJ SOLN
INTRAMUSCULAR | Status: AC
  Administered 2018-01-20: 1000 [IU]
  Filled 2018-01-20: qty 13

## 2018-01-20 MED ORDER — MIDODRINE HCL 5 MG PO TABS
ORAL_TABLET | ORAL | Status: AC
  Administered 2018-01-20: 10 mg via ORAL
  Filled 2018-01-20: qty 10

## 2018-01-20 NOTE — Progress Notes (Addendum)
[facility] Progress Note   Dialysis Orders: [place] T,Th,S 4.5 hrs 180NRe 450/Autoflow 1.5 125.5 kg 2.0 K/ 2.0 Ca  LUA AVF -Heparin 13,000 units IV TIW -Mircera 60 mcg IV q 2 weeks (last dose 01/07/18 Last HGB 10.1 Tsat 14 Iron 28 01/14/18) -Calcitriol 2.5 mcg PO TIW (last PTH 511 Ca 8.9 C Ca 9.1 Phos 3.8 01/14/18)  Assessment/Plan: 1. Exacerbation of COPD/bronchitis - per primary + rhinovirus/enterovirus/ steroids/breathing treatment/azithromycin/benzonatate - former smoker 2. ESRD -TTS  HD today off schedule - had treatment Tuesday with net UF 3.5 - said BP dropped BP much higher than usual today. Did NOT have standing wt pre HD - d/w RN to do this point HD. Plan HD again on Thursday for 3 hours - since having 3 days in a row.  It is odd that K is not dropping in fact increased between treatments yesterday and today. 3. Anemia - hgb 10.7 - if hgb drops can give short course IV Fe even with high ferritin - continue Aranesp 60 q week 4. Secondary hyperparathyroidism - binders/VDRA ordered Ca/P ok 5. HTN/volume - volume as noted above  - prone to low BP on dialysis- per primary note for CXR today - if extra volume can increase time on dialysis Thursday if needed 6. Nutrition - changed to renal carb mod/vits 7. DM - per primary - BS remains elevated [place], PA-C [place] 01/20/2018,8:00 AM  LOS: 2 days    Seen on HD 122/65  On 3/2 bath Goal net Uf 3.3L, tolerating for now She thinks the breathing is improving a little but still experiencing dsypnea w/ +cough.  HD Thursday as well, still above EDW.    Subjective:   Said BP dropped on HD yesterday.  Objective Vitals:   01/20/18 0521 01/20/18 0713 01/20/18 0724 01/20/18 0730  BP: (!) 144/88 (!) 146/76 (!) 150/71 106/90  Pulse: 69  67 68  Resp: 16  15 (!) 21  Temp: 98.8 F (37.1 C) 98.4 F (36.9 C)    TempSrc:  Oral    SpO2: 93%     Weight: 128.1 kg 128.4 kg     Height:       Physical Exam goal 3.5   General:NAD bust seems SOB with talking Sats ok  Heart: RRR with PVCs Lungs: scattered w wheezes Abdomen: obese soft NT Extremities: tr LE edema Dialysis Access: left AVF Qb 400    Additional Objective Labs: Basic Metabolic Panel: Recent Labs  Lab 01/18/18 0402 01/19/18 0742 01/20/18 0403  NA 137 134* 134*  K 5.1 4.7 5.1  CL 95* 97* 95*  CO2 25 22 29   GLUCOSE 330* 327* 351*  BUN 68* 102* 53*  CREATININE 10.21* 12.22* 7.25*  CALCIUM 8.8* 8.3* 9.0  PHOS  --  3.5 4.3   Liver Function Tests: Recent Labs  Lab 01/19/18 0742 01/20/18 0403  AST  --  20  ALT  --  14  ALKPHOS  --  66  BILITOT  --  0.7  PROT  --  7.4  ALBUMIN 2.8* 3.0*   No results for input(s): LIPASE, AMYLASE in the last 168 hours. CBC: Recent Labs  Lab 01/16/18 1822  01/18/18 0402 01/19/18 0742 01/20/18 0403  WBC 10.1  --  15.0* 14.1* 11.8*  NEUTROABS 5.8  --   --   --  9.7*  HGB 10.9*   < > 10.3* 10.1* 10.7*  HCT 35.8*   < > 32.9* 31.6*  33.4*  MCV 94.2  --  91.9 92.4 91.8  PLT 183  --  222 251 249   < > = values in this interval not displayed.   Blood Culture    Component Value Date/Time   SDES SPUTUM 01/17/2018 1249   SDES SPUTUM 01/17/2018 1249   SPECREQUEST Normal 01/17/2018 1249   SPECREQUEST Normal Reflexed from [id number] 01/17/2018 1249   CULT  01/17/2018 1249    FEW Consistent with normal respiratory flora. Performed at [facility], [place] [address], [place], [place] [address]    REPTSTATUS 01/17/2018 FINAL 01/17/2018 1249   REPTSTATUS PENDING 01/17/2018 1249    Cardiac Enzymes: No results for input(s): CKTOTAL, CKMB, CKMBINDEX, TROPONINI in the last 168 hours. CBG: Recent Labs  Lab 01/18/18 1640 01/18/18 2153 01/19/18 1327 01/19/18 1626 01/19/18 2143  GLUCAP 187* 287* 181* 302* 376*   Iron Studies: No results for input(s): IRON, TIBC, TRANSFERRIN, FERRITIN in the last 72 hours. Lab Results  Component Value Date   INR  1.13 01/16/2018   INR 1.12 04/06/2017   INR 1.10 07/28/2015   Studies/Results: No results found. Medications:  . heparin      . aspirin EC  81 mg Oral Daily  . atorvastatin  40 mg Oral Daily  . azithromycin  250 mg Oral Daily  . [START ON 01/21/2018] calcitRIOL  2.5 mcg Oral Q T,Th,Sa-HD  . Chlorhexidine Gluconate Cloth  6 each Topical Q0600  . cinacalcet  90 mg Oral Q breakfast  . [START ON 01/21/2018] darbepoetin (ARANESP) injection - DIALYSIS  60 mcg Intravenous Q Thu-HD  . diphenhydrAMINE  50 mg Oral Q T,Th,Sa-HD  . guaiFENesin  1,200 mg Oral BID  . heparin  5,000 Units Subcutaneous Q8H  . insulin aspart  0-9 Units Subcutaneous TID WC  . insulin glargine  4 Units Subcutaneous QHS  . ipratropium-albuterol  3 mL Nebulization Q6H  . methylPREDNISolone (SOLU-MEDROL) injection  60 mg Intravenous Q8H  . midodrine  10 mg Oral TID  . multivitamin with minerals  1 tablet Oral Daily  . pentafluoroprop-tetrafluoroeth  1 application Topical Q T,Th,Sa-HD  . sevelamer carbonate  1,600 mg Oral TID WC

## 2018-01-20 NOTE — Consult Note (Signed)
NAME:  [name], MRN:  [id number], DOB:  06/27/1945, LOS: 2 ADMISSION DATE:  01/16/2018, CONSULTATION DATE: 01/20/2018 REFERRING MD:  Bertram Savin, DO CHIEF COMPLAINT: Persistent dyspnea  Brief History   73 year old female with emphysema admitted for COPD exacerbation. RVP positive for Rhinovirus. Treated with bronchodilators, steroids and azithromycin. Pulmonary consulted for dyspnea despite current therapy.   History of present illness   73 year old female with emphysema (PFTs with no obstruction on spiro however DLCO moderately reduced 52%) who initially presented with worsening shortness of breath associated with nonproductive cough and wheezing x 3 days that did not improve with her home inhaler regimen. RVP positive for Rhinovirus. Admitted for COPD exacerbation and treated with bronchodilators, steroids and azithromycin. Pulmonary consulted for dyspnea.   Past Medical History  HTN, DM2, chronic diastolic heart failure, mild OSA  Significant Hospital Events   12/28 Admitted  Consults:  1/1 Pulm  Procedures:  None  Significant Diagnostic Tests:    Micro Data:  RVP +Rhinovirus RQ 12/29 - Neg BCx 12/28 - Neg  Antimicrobials:  Vanc 12/28 OTO Cefepime 12/28 OTO Azithromycin 12/29>   Interim history/subjective:  She reports improved shortness of breath compared to admission however does not feel like she is back to baseline.  She states that she does not routinely use her Spiriva as the capsule does not always break and she is unsure of the benefit of the medication so she takes it intermittently.  Prior to hospitalization she reports dyspnea associated with occasional cough and wheezing at baseline.  Objective   Blood pressure (!) 111/58, pulse 72, temperature 98.4 F (36.9 C), temperature source Oral, resp. rate 18, height 5\' 6"  (1.676 m), weight 126.1 kg, SpO2 93 %.        Intake/Output Summary (Last 24 hours) at 01/20/2018 1415 Last data filed at 01/20/2018  1155 Gross per 24 hour  Intake 120 ml  Output 3100 ml  Net -2980 ml   Filed Weights   01/20/18 0521 01/20/18 0713 01/20/18 1155  Weight: 128.1 kg 128.4 kg 126.1 kg    Examination: General: Morbidly obese, sitting in chair, no acute distress HENT: [place], AT, pink OP, MMM, Mallampati III Lungs: Intermittent bilateral inspiratory wheeze and squeak present, no crackles Cardiovascular: RRR, no MGR Abdomen: Soft, nontender to palpation, bowel sounds present Extremities: 1+ nonpitting edema, pedal pulses palpable Neuro: AAO x4, CN II to XII intact Psych: Normal mood, normal affect  CXR 01/20/18 reviewed myself - No acute findings. No infiltrate,effusion or edema. RQ 12/29 - Normal flora RVP positive for Rhinovirus  Resolved Hospital Problem list    Assessment:  COPD exacerbation secondary to Rhinovirus Leukocytosis improving. Currently on RA.  Patient with subjective dyspnea in setting of resolving URI secondary to virus.  I suspect that her dyspnea may be more related to her baseline emphysema.  She will need to optimize her bronchodilator regimen which includes adding a LABA/ICS inhaler.  To encourage compliance with her current LAMA inhaler, will recommend Spiriva Respimat instead of capsule formula.  Recommendations:  -Agree with steroids. Complete 5 days total.  No taper indicated -Complete azithromycin x5 days total -While inpatient, continue Duonebs TID and Pulmicort BID. Will add brovana BID. -On discharge, discontinue nebulizers    Start Spiriva Respimat 2.5 mcg 2 inhalations daily   Start ICS/LABA (eg Symbicort 160-4.5 mcg 2 puffs twice daily) -I will message staff to arrange Pulmonary follow-up after discharge   Pulmonary will sign off. Please call for any questions.  Rodman Pickle, M.D. [facility] Pulmonary/Critical Care Medicine Pager: [phone] After hours pager: [phone]    Labs   CBC: Recent Labs  Lab 01/16/18 1822 01/16/18 1846 01/18/18 0402  01/19/18 0742 01/20/18 0403  WBC 10.1  --  15.0* 14.1* 11.8*  NEUTROABS 5.8  --   --   --  9.7*  HGB 10.9* 12.2 10.3* 10.1* 10.7*  HCT 35.8* 36.0 32.9* 31.6* 33.4*  MCV 94.2  --  91.9 92.4 91.8  PLT 183  --  222 251 433    Basic Metabolic Panel: Recent Labs  Lab 01/16/18 1822 01/16/18 1846 01/18/18 0402 01/19/18 0742 01/20/18 0403  NA 141 141 137 134* 134*  K 4.2 4.1 5.1 4.7 5.1  CL 97* 99 95* 97* 95*  CO2 28  --  25 22 29   GLUCOSE 106* 102* 330* 327* 351*  BUN 29* 34* 68* 102* 53*  CREATININE 6.97* 7.00* 10.21* 12.22* 7.25*  CALCIUM 8.9  --  8.8* 8.3* 9.0  MG  --   --   --   --  2.4  PHOS  --   --   --  3.5 4.3   GFR: Estimated Creatinine Clearance: 9.4 mL/min (A) (by C-G formula based on SCr of 7.25 mg/dL (H)). Recent Labs  Lab 01/16/18 1822 01/16/18 1847 01/16/18 2012 01/18/18 0402 01/19/18 0742 01/20/18 0403  WBC 10.1  --   --  15.0* 14.1* 11.8*  LATICACIDVEN  --  1.26 1.47  --   --   --     Liver Function Tests: Recent Labs  Lab 01/19/18 0742 01/20/18 0403  AST  --  20  ALT  --  14  ALKPHOS  --  66  BILITOT  --  0.7  PROT  --  7.4  ALBUMIN 2.8* 3.0*   No results for input(s): LIPASE, AMYLASE in the last 168 hours. No results for input(s): AMMONIA in the last 168 hours.  ABG    Component Value Date/Time   PHART 7.317 (L) 09/10/2008 1910   PCO2ART 54.4 (H) 09/10/2008 1910   PO2ART 120.0 (H) 09/10/2008 1910   HCO3 33.6 (H) 01/16/2018 2011   TCO2 35 (H) 01/16/2018 2011   O2SAT 78.0 01/16/2018 2011     Coagulation Profile: Recent Labs  Lab 01/16/18 1822  INR 1.13    Cardiac Enzymes: No results for input(s): CKTOTAL, CKMB, CKMBINDEX, TROPONINI in the last 168 hours.  HbA1C: Hgb A1c MFr Bld  Date/Time Value Ref Range Status  01/18/2018 04:02 AM 5.7 (H) 4.8 - 5.6 % Final    Comment:    (NOTE) Pre diabetes:          5.7%-6.4% Diabetes:              >6.4% Glycemic control for   <7.0% adults with diabetes   04/02/2017 09:14 PM 5.6  4.8 - 5.6 % Final    Comment:    (NOTE) Pre diabetes:          5.7%-6.4% Diabetes:              >6.4% Glycemic control for   <7.0% adults with diabetes     CBG: Recent Labs  Lab 01/18/18 2153 01/19/18 1327 01/19/18 1626 01/19/18 2143 01/20/18 1124  GLUCAP 287* 181* 302* 376* 145*    Review of Systems:   Review of Systems  Constitutional: Negative for chills, diaphoresis, fever, malaise/fatigue and weight loss.  HENT: Negative for congestion and sore throat.   Respiratory: Positive for cough, shortness of  breath and wheezing. Negative for hemoptysis and sputum production.   Cardiovascular: Negative for chest pain, orthopnea, leg swelling and PND.  Gastrointestinal: Negative for abdominal pain, heartburn and nausea.  Genitourinary: Negative for frequency.  Musculoskeletal: Negative for myalgias.  Skin: Negative for rash.  Neurological: Negative for dizziness, weakness and headaches.  Endo/Heme/Allergies: Does not bruise/bleed easily.     Past Medical History  She,  has a past medical history of Abdominal abscess (12-17-10), Anemia, Arthritis, Blind right eye, CHF (congestive heart failure) ([place]), Chronic lower back pain, Coronary artery disease, Dyspnea, Dysrhythmia, ESRD (end stage renal disease) on dialysis [facility]), Exertional shortness of breath, Eye drainage, Gout, H/O hiatal hernia, Headache(784.0), Heart murmur, History of blood transfusion, Hypertension, Inhalation injury, Myocardial infarction ([place]) (1970's or 1980's), Neuromuscular disorder ([place]), OSA on CPAP, Staph aureus infection (November 2012 ), Swelling of both ankles (12-17-10), and Type II diabetes mellitus ([place]).   Surgical History    Past Surgical History:  Procedure Laterality Date  . [place] VITRECTOMY WITH 20 GAUGE MVR PORT Left 05/07/2017   Procedure: 25 GAUGE PARS PLANA VITRECTOMY WITH ENDOLASER LEFT EYE;  Surgeon: Bernarda Caffey, MD;  Location: [facility];  Service: Ophthalmology;  Laterality:  Left;  . APPENDECTOMY    . AV FISTULA PLACEMENT  09/30/2011   Procedure: ARTERIOVENOUS (AV) FISTULA CREATION;  Surgeon: Conrad [place], MD;  Location: [facility];  Service: Vascular;  Laterality: Left;  BRACHIAL-CEPHALIC  . BACK SURGERY    . BASCILIC VEIN TRANSPOSITION Left 11/10/2011   Left arm   . [place] TRANSPOSITION  01/28/2012   Procedure: BASCILIC VEIN TRANSPOSITION;  Surgeon: Conrad [place], MD;  Location: [facility];  Service: Vascular;  Laterality: Left;  Second Stage   . BIOPSY  08/24/2017   Procedure: BIOPSY;  Surgeon: Wilford Corner, MD;  Location: [facility];  Service: Endoscopy;;  . CARDIAC CATHETERIZATION  1980's  . CATARACT EXTRACTION W/ INTRAOCULAR LENS IMPLANT Bilateral 1990's  . COLONOSCOPY WITH PROPOFOL N/A 08/24/2017   Procedure: COLONOSCOPY WITH PROPOFOL;  Surgeon: Wilford Corner, MD;  Location: [facility];  Service: Endoscopy;  Laterality: N/A;  . CORONARY ANGIOGRAPHY N/A 04/07/2017   Procedure: CORONARY ANGIOGRAPHY (CATH LAB);  Surgeon: Dixie Dials, MD;  Location: [facility];  Service: Cardiovascular;  Laterality: N/A;  . DILATION AND CURETTAGE OF UTERUS  1960's  . ESOPHAGOGASTRODUODENOSCOPY (EGD) WITH PROPOFOL N/A 08/24/2017   Procedure: ESOPHAGOGASTRODUODENOSCOPY (EGD) WITH PROPOFOL;  Surgeon: Wilford Corner, MD;  [facility];  Service: Endoscopy;  Laterality: N/A;  . EYE SURGERY    . FISTULA SUPERFICIALIZATION Left 02/18/2017   Procedure: SUPERFICIALIZATION LEFT ARM ARTERIOVENOUS FISTULA;  Surgeon: Serafina Mitchell, MD;  Location: [facility];  Service: Vascular;  Laterality: Left;  . HERNIA REPAIR    . INSERTION OF DIALYSIS CATHETER  01/05/2012   Procedure: INSERTION OF DIALYSIS CATHETER;  Surgeon: Conrad Dawson, MD;  Location: [facility];  Service: Vascular;  Laterality: N/A;  Right Internal Jugular Placement  . INTRAOCULAR PROSTHESES INSERTION Right 2013   "fell; knocked my eye outl" (08/23/2012)  . JOINT REPLACEMENT    . LAPAROTOMY  12/18/2010    Procedure: EXPLORATORY LAPAROTOMY;  Surgeon: Rolm Bookbinder, MD;  Location: [facility];  Service: General;  Laterality: N/A;  Abdominal Seroma Evacuation  . LEFT HEART CATHETERIZATION WITH CORONARY ANGIOGRAM N/A 08/25/2012   Procedure: LEFT HEART CATHETERIZATION WITH CORONARY ANGIOGRAM;  Surgeon: Birdie Riddle, MD;  Location: [facility];  Service: Cardiovascular;  Laterality: N/A;  . LUMBAR  [place] SURGERY  1970'-80's   X 3  . TOTAL KNEE ARTHROPLASTY Left 1980's  . VAGINAL HYSTERECTOMY  1970's  . VENTRAL HERNIA REPAIR  2012-2013   component separation, repair with biologic; "had 3 surgeries to fix it" (08/23/2012)  . WOUND DEBRIDEMENT  03/20/2011   Procedure: DEBRIDEMENT ABDOMINAL WOUND;  Surgeon: Rolm Bookbinder, MD;  Location: [facility];  Service: General;  Laterality: N/A;  debridement abdominal wall, placement of wound vac     Social History   reports that she quit smoking about 37 years ago. Her smoking use included cigarettes. She has a 4.00 pack-year smoking history. She has never used smokeless tobacco. She reports that she does not drink alcohol or use drugs.   Family History   Her family history includes Cancer in her brother and sister; Hypertension in her father, maternal grandmother, and mother. There is no history of Anesthesia problems.   Allergies Allergies  Allergen Reactions  . Lyrica [Pregabalin] Other (See Comments)    Hallucinations     Home Medications  Prior to Admission medications   Medication Sig Start Date End Date Taking? Authorizing Provider  albuterol (PROVENTIL) (2.5 MG/3ML) 0.083% nebulizer solution Take 3 mLs (2.5 mg total) by nebulization every 2 (two) hours as needed for shortness of breath. 10/21/16  Yes Elwin Mocha, MD  aspirin EC 81 MG tablet Take 81 mg by mouth daily.     Yes [provider]  atorvastatin (LIPITOR) 40 MG tablet Take 40 mg by mouth daily.  04/26/17  Yes [provider]  carvedilol (COREG) 25 MG tablet Take 0.5 tablets  (12.5 mg total) by mouth 2 (two) times daily. Patient taking differently: Take 25 mg by mouth 2 (two) times daily.  04/09/17  Yes Dixie Dials, MD  cinacalcet (SENSIPAR) 90 MG tablet Take 90 mg by mouth daily.    Yes [provider]  diphenhydrAMINE (BENADRYL) 25 mg capsule Take 50 mg by mouth See admin instructions. Take 2 tablets (50 mg) by mouth prior to dialysis on Tuesday, Thursday, Saturday - for itching   Yes [provider]  ethyl chloride spray Apply 1 application topically See admin instructions. Apply topically before dialysis on Tuesday, Thursday, Saturday 01/10/16  Yes [provider]  insulin glargine (LANTUS) 100 UNIT/ML injection Inject 0.05 mLs (5 Units total) into the skin at bedtime. Patient taking differently: Inject 6 Units into the skin at bedtime.  04/09/17  Yes Dixie Dials, MD  midodrine (PROAMATINE) 10 MG tablet Take 10 mg by mouth See admin instructions. Take 10mg  on Tues, Thurs, and Saturday before dialysis 04/26/17  Yes [provider]  Multiple Vitamin (MULTIVITAMIN WITH MINERALS) TABS tablet Take 1 tablet by mouth daily.   Yes [provider]  oxyCODONE-acetaminophen (PERCOCET/ROXICET) 5-325 MG tablet Take 1 tablet by mouth 4 (four) times daily as needed for pain. 08/03/17  Yes [provider]  sevelamer carbonate (RENVELA) 800 MG tablet Take 2-3 tablets (1,600-2,400 mg total) by mouth See admin instructions. Take 3 tablets (2,400 mg) by mouth 3 times daily with meals. Take 2 tablets (1,600 mg) by mouth with snacks. Receives supply from dialysis center. Patient taking differently: Take 1,600-2,400 mg by mouth See admin instructions. Pt takes 4 tablets after every meal 04/09/17  Yes Dixie Dials, MD  tiotropium (SPIRIVA) 18 MCG inhalation capsule Place 1 capsule (18 mcg total) into inhaler and inhale daily. 10/21/16  Yes Elwin Mocha, MD     Rodman Pickle, M.D. [facility] Pulmonary/Critical Care Medicine Pager:  [phone] After hours pager: [phone]

## 2018-01-20 NOTE — Progress Notes (Signed)
PROGRESS NOTE    [name]  LDJ:[id number] DOB: Oct 26, 1945 DOA: 01/16/2018 PCP: [name], MD   Brief Narrative:   [name] is a 73 y.o. year old female with medical history significant for ESRD on HD TThSat, HTN, type 2 diabetes, diastolic CHF, CAD, OSA on CPAP, former smoker, HLD and other comorbids who presented on 01/16/2018 with 2 to 3 days of worsening nonproductive cough and shortness of breath with no improvement with home inhaler regimen and was found to have acute bronchitis/COPD exacerbation secondary to rhinovirus infection. She continued to wheeze significantly but feels as if her breathing is slowly improving. This AM she states her breathing was still "shallow."  Assessment & Plan:   Principal Problem:   COPD exacerbation (HCC) Active Problems:   OSA on CPAP   Coronary artery disease   ESRD needing dialysis (HCC)   Type II diabetes mellitus with renal manifestations (HCC)   Chest pain   Chronic diastolic CHF (congestive heart failure) (HCC)   Hypotension   Acute bronchitis   Essential hypertension   HLD (hyperlipidemia)   Acute bronchitis due to Rhinovirus   Respiratory distress, acute  Acute Bronchitis/COPD flare secondary to Rhinovirus.  -Still has some dyspnea, diffuse coarse wheezing despite recent breathing treatment.  -Needs to stay for inpatient course due to significant respiratory distress from the virus exacerbating her COPD. -Will discontinue prednisone for IV Solumedrol and increase scheduled duo-nebs (from BID to q6 H) -PRN albuterol nebs and will place the patient on guaifenesin 1200 mg p.o. twice daily, flutter valve, incentive spirometer -Nebs 3 mils every 6 scheduled and continue with Solu-Medrol 40 g IV every 12 but will increase to 60 mg q8h -Continue with Incruse Ellipta -C/w Benzonatate 200 mg po TID -Will C/w Azithromycin but increase dose to 500 mg po  -Since she did not significantly improve and is still dyspenic with  "shallow breathing" will place on Budesonide -Will also consult Pulmonary for further evaluation and Recommendations -Repeat CXR to be still done this AM  ESRD on HD -Adherent with regimen.  On Tuesday Thursday Saturday schedule, nephrology following and appreciate further evaluation and recommendations  -BUN/Cr was 102/12.22 yesterday and patient went for Dialysis yesterday and will dialyze again today -BUN/Cr was 53/7.25 this AM   Secondary Hyperparathyroidism. -Phos was 3.5 yesterday and today it was 4.3 -Continue home Renvela and Cinacalcet  Bone mineral disease related to ESRD.   -Continue home Calcitriol 2.5 mcg TThSat with HD.  Chronic Anemia and Anemia of CKD.   -Continue Aranesp 60 qweek with dialysis.   -Hb/Hct went from 10.3/32.9 -> 10.1/31.6 -> 10.7/33.4 -Per Nephro if Hgb drops can give a short course of IV Fe  -Continuet to Monitor CBC  Hypertension, at goal.   -Prone to low BP at dialysis.  Continue home Midodrine. -Have not needed to resume home carvedilol (12.5 twice daily) yet -BP this AM was 122/65 but had a reading of 234/65 yesterday -Continue to Monitor Closely  Hyperlipidemia -Stable.   -Continue Atorvastatin 40 mg po Daily  Type 2 Diabetes  -A1c 5.7 (12/19).   -Monitor CBGs. CBGs ranging from 181-376 -Continue home Lantus for units (decreased dose).   -May need to increase given start IV steroids, will monitor.  Morbid Obesity  -Estimated body mass index is 45.69 kg/m as calculated from the following:   Height as of this encounter: 5\' 6"  (1.676 m).   Weight as of this encounter: 128.4 kg. -Weight Loss Counseling given  Hyponatremia -Mild  at 134 -Expect to correct with Dialysis -Continue to Monitor and Repeat CMP in AM  DVT prophylaxis: Heparin 5,000 sq q8h Code Status: FULL CODE Family Communication: No family present at bedside Disposition Plan: Remain Inpatient for continued workup and treatment   Consultants:   Nephrology     Procedures:  Dialysis    Antimicrobials:  Anti-infectives (From admission, onward)   Start     Dose/Rate Route Frequency Ordered Stop   01/18/18 1000  azithromycin (ZITHROMAX) tablet 250 mg     250 mg Oral Daily 01/16/18 2048 01/22/18 0959   01/17/18 1000  azithromycin (ZITHROMAX) tablet 500 mg     500 mg Oral Daily 01/16/18 2048 01/17/18 0811   01/16/18 2000  vancomycin (VANCOCIN) 2,000 mg in sodium chloride 0.9 % 500 mL IVPB     2,000 mg 250 mL/hr over 120 Minutes Intravenous  Once 01/16/18 1953 01/16/18 2315   01/16/18 1945  ceFEPIme (MAXIPIME) 2 g in sodium chloride 0.9 % 100 mL IVPB     2 g 200 mL/hr over 30 Minutes Intravenous  Once 01/16/18 1933 01/16/18 2056     Subjective: Seen and examined again in dialysis and states that her breathing was "shallow on ".  Still has a significant cough and states it is productive sometimes.  States her abdomen hurts when she coughs.  No nausea or vomiting.  Still does not feel too great.  No other concerns or complaints at this time and resting in dialysis bed.  Objective: Vitals:   01/20/18 0730 01/20/18 0800 01/20/18 0830 01/20/18 0900  BP: 106/90 124/61 124/85 (!) 234/65  Pulse: 68 (!) 55 (!) 59 62  Resp: (!) 21     Temp:      TempSrc:      SpO2:      Weight:      Height:        Intake/Output Summary (Last 24 hours) at 01/20/2018 0933 Last data filed at 01/20/2018 0600 Gross per 24 hour  Intake 120 ml  Output 3500 ml  Net -3380 ml   Filed Weights   01/19/18 1147 01/20/18 0521 01/20/18 0713  Weight: 127.5 kg 128.1 kg 128.4 kg   Examination: Physical Exam:  Constitutional: Well-nourished, well-developed obese African-American female currently in no acute distress laying in bed getting dialysis does appear somewhat uncomfortable Eyes: Lids and conjunctive are normal.  Sclera anicteric ENMT: External ears and nose appear normal.  Mucous members are moist  Neck: Appears supple no JVD Respiratory: Diminished to  auscultation bilaterally with no appreciable wheezing but does have coarse breath sounds.  Has a normal respiratory effort and patient is not tachypneic but she is having some shallow breathing with no accessory muscle use.  Has a nonproductive cough and goes into mild respiratory distress when she coughs significantly.  Not wearing supplemental oxygen via nasal cannula  Cardiovascular: Regular rate and rhythm.  No appreciable murmurs, rubs, gallops.  Mild lower extremity edema Abdomen: Soft, nontender, distended secondary body habitus.  Backslash present GU: Deferred Musculoskeletal: No contractures or cyanosis.  No joint deformities noted Skin: Appreciable rashes or lesions on limited skin evaluation.  She is getting dialysis through her left upper arm fistula. Neurologic: No nerves II through XII gross intact no appreciable focal deficits Psychiatric: Normal judgment and insight.  Patient is awake and alert and oriented x3.  Pleasant mood and affect  Data Reviewed: I have personally reviewed following labs and imaging studies  CBC: Recent Labs  Lab 01/16/18 1822 01/16/18  1846 01/18/18 0402 01/19/18 0742 01/20/18 0403  WBC 10.1  --  15.0* 14.1* 11.8*  NEUTROABS 5.8  --   --   --  9.7*  HGB 10.9* 12.2 10.3* 10.1* 10.7*  HCT 35.8* 36.0 32.9* 31.6* 33.4*  MCV 94.2  --  91.9 92.4 91.8  PLT 183  --  222 251 373   Basic Metabolic Panel: Recent Labs  Lab 01/16/18 1822 01/16/18 1846 01/18/18 0402 01/19/18 0742 01/20/18 0403  NA 141 141 137 134* 134*  K 4.2 4.1 5.1 4.7 5.1  CL 97* 99 95* 97* 95*  CO2 28  --  25 22 29   GLUCOSE 106* 102* 330* 327* 351*  BUN 29* 34* 68* 102* 53*  CREATININE 6.97* 7.00* 10.21* 12.22* 7.25*  CALCIUM 8.9  --  8.8* 8.3* 9.0  MG  --   --   --   --  2.4  PHOS  --   --   --  3.5 4.3   GFR: Estimated Creatinine Clearance: 9.5 mL/min (A) (by C-G formula based on SCr of 7.25 mg/dL (H)). Liver Function Tests: Recent Labs  Lab 01/19/18 0742 01/20/18 0403   AST  --  20  ALT  --  14  ALKPHOS  --  66  BILITOT  --  0.7  PROT  --  7.4  ALBUMIN 2.8* 3.0*   No results for input(s): LIPASE, AMYLASE in the last 168 hours. No results for input(s): AMMONIA in the last 168 hours. Coagulation Profile: Recent Labs  Lab 01/16/18 1822  INR 1.13   Cardiac Enzymes: No results for input(s): CKTOTAL, CKMB, CKMBINDEX, TROPONINI in the last 168 hours. BNP (last 3 results) No results for input(s): PROBNP in the last 8760 hours. HbA1C: Recent Labs    01/18/18 0402  HGBA1C 5.7*   CBG: Recent Labs  Lab 01/18/18 1640 01/18/18 2153 01/19/18 1327 01/19/18 1626 01/19/18 2143  GLUCAP 187* 287* 181* 302* 376*   Lipid Profile: No results for input(s): CHOL, HDL, LDLCALC, TRIG, CHOLHDL, LDLDIRECT in the last 72 hours. Thyroid Function Tests: No results for input(s): TSH, T4TOTAL, FREET4, T3FREE, THYROIDAB in the last 72 hours. Anemia Panel: No results for input(s): VITAMINB12, FOLATE, FERRITIN, TIBC, IRON, RETICCTPCT in the last 72 hours. Sepsis Labs: Recent Labs  Lab 01/16/18 1847 01/16/18 2012  LATICACIDVEN 1.26 1.47    Recent Results (from the past 240 hour(s))  Blood culture (routine x 2)     Status: None (Preliminary result)   Collection Time: 01/16/18  7:38 PM  Result Value Ref Range Status   Specimen Description BLOOD BLOOD RIGHT WRIST  Final   Special Requests   Final    BOTTLES DRAWN AEROBIC AND ANAEROBIC Blood Culture adequate volume   Culture   Final    NO GROWTH 3 DAYS Performed at [facility], [place] [address] [place], [place] [address]    Report Status PENDING  Incomplete  Blood culture (routine x 2)     Status: None (Preliminary result)   Collection Time: 01/16/18  7:42 PM  Result Value Ref Range Status   Specimen Description BLOOD BLOOD RIGHT HAND  Final   Special Requests   Final    BOTTLES DRAWN AEROBIC AND ANAEROBIC Blood Culture adequate volume   Culture   Final    NO GROWTH 3 DAYS Performed at [facility], [place] [address], [place], [place] [address]    Report Status PENDING  Incomplete  Respiratory Panel by PCR  Status: Abnormal   Collection Time: 01/16/18  8:55 PM  Result Value Ref Range Status   Adenovirus NOT DETECTED NOT DETECTED Final   Coronavirus 229E NOT DETECTED NOT DETECTED Final   Coronavirus HKU1 NOT DETECTED NOT DETECTED Final   Coronavirus NL63 NOT DETECTED NOT DETECTED Final   Coronavirus OC43 NOT DETECTED NOT DETECTED Final   Metapneumovirus NOT DETECTED NOT DETECTED Final   Rhinovirus / Enterovirus DETECTED (A) NOT DETECTED Final   Influenza A NOT DETECTED NOT DETECTED Final   Influenza B NOT DETECTED NOT DETECTED Final   Parainfluenza Virus 1 NOT DETECTED NOT DETECTED Final   Parainfluenza Virus 2 NOT DETECTED NOT DETECTED Final   Parainfluenza Virus 3 NOT DETECTED NOT DETECTED Final   Parainfluenza Virus 4 NOT DETECTED NOT DETECTED Final   Respiratory Syncytial Virus NOT DETECTED NOT DETECTED Final   Bordetella pertussis NOT DETECTED NOT DETECTED Final   Chlamydophila pneumoniae NOT DETECTED NOT DETECTED Final   Mycoplasma pneumoniae NOT DETECTED NOT DETECTED Final  MRSA PCR Screening     Status: None   Collection Time: 01/17/18  3:33 AM  Result Value Ref Range Status   MRSA by PCR NEGATIVE NEGATIVE Final    Comment:        The GeneXpert MRSA Assay (FDA approved for NASAL specimens only), is one component of a comprehensive MRSA colonization surveillance program. It is not intended to diagnose MRSA infection nor to guide or monitor treatment for MRSA infections. Performed at [facility], [place] [address] [place], [place] [address]   Culture, sputum-assessment     Status: None   Collection Time: 01/17/18  3:56 AM  Result Value Ref Range Status   Specimen Description SPUTUM  Final   Special Requests   Final    NONE Performed at [facility], [place] [address], [place], [place] [address]    Sputum evaluation   Final    Sputum specimen  not acceptable for testing.  Please recollect.   RESULT CALLED TO, READ BACK BY AND VERIFIED WITH: RN Boyd Kerbs [phone] FCP    Report Status 01/17/2018 FINAL  Final  Expectorated sputum assessment w rflx to resp cult     Status: None   Collection Time: 01/17/18 12:49 PM  Result Value Ref Range Status   Specimen Description SPUTUM  Final   Special Requests Normal  Final   Sputum evaluation   Final    RARE WBC PRESENT, PREDOMINANTLY PMN RARE SQUAMOUS EPITHELIAL CELLS PRESENT FEW GRAM POSITIVE COCCI FEW GRAM NEGATIVE RODS THIS SPECIMEN IS ACCEPTABLE FOR SPUTUM CULTURE Performed at [facility], [place] [address], [place], [place] [address]    Report Status 01/17/2018 FINAL  Final  Culture, respiratory     Status: None (Preliminary result)   Collection Time: 01/17/18 12:49 PM  Result Value Ref Range Status   Specimen Description SPUTUM  Final   Special Requests Normal Reflexed from [id number]  Final   Gram Stain   Final    RARE WBC PRESENT, PREDOMINANTLY PMN NO SQUAMOUS EPITHELIAL CELLS SEEN FEW GRAM POSITIVE COCCI RARE GRAM NEGATIVE RODS RARE GRAM POSITIVE RODS    Culture   Final    FEW Consistent with normal respiratory flora. Performed at [facility], [place] [address], [place], [place] [address]    Report Status PENDING  Incomplete    Radiology Studies: No results found.  Scheduled Meds: . heparin      . aspirin EC  81  mg Oral Daily  . atorvastatin  40 mg Oral Daily  . azithromycin  250 mg Oral Daily  . [START ON 01/21/2018] calcitRIOL  2.5 mcg Oral Q T,Th,Sa-HD  . Chlorhexidine Gluconate Cloth  6 each Topical Q0600  . Chlorhexidine Gluconate Cloth  6 each Topical Q0600  . cinacalcet  90 mg Oral Q breakfast  . [START ON 01/21/2018] darbepoetin (ARANESP) injection - DIALYSIS  60 mcg Intravenous Q Thu-HD  . diphenhydrAMINE  50 mg Oral Q T,Th,Sa-HD  . guaiFENesin  1,200 mg Oral BID  . heparin  5,000 Units Subcutaneous Q8H  . insulin aspart  0-9 Units Subcutaneous  TID WC  . insulin glargine  4 Units Subcutaneous QHS  . ipratropium-albuterol  3 mL Nebulization Q6H  . methylPREDNISolone (SOLU-MEDROL) injection  60 mg Intravenous Q8H  . midodrine  10 mg Oral TID  . multivitamin with minerals  1 tablet Oral Daily  . pentafluoroprop-tetrafluoroeth  1 application Topical Q T,Th,Sa-HD  . sevelamer carbonate  1,600 mg Oral TID WC   Continuous Infusions:   LOS: 2 days   Kerney Elbe, DO [facility] PAGER is on AMION  If 7PM-7AM, please contact night-coverage [email or web address] Password [facility] 01/20/2018, 9:33 AM

## 2018-01-21 DIAGNOSIS — D72829 Elevated white blood cell count, unspecified: Secondary | ICD-10-CM

## 2018-01-21 LAB — CBC WITH DIFFERENTIAL/PLATELET
Abs Immature Granulocytes: 0.31 10*3/uL — ABNORMAL HIGH (ref 0.00–0.07)
Basophils Absolute: 0 10*3/uL (ref 0.0–0.1)
Basophils Relative: 0 %
Eosinophils Absolute: 0 10*3/uL (ref 0.0–0.5)
Eosinophils Relative: 0 %
HCT: 34.6 % — ABNORMAL LOW (ref 36.0–46.0)
Hemoglobin: 10.9 g/dL — ABNORMAL LOW (ref 12.0–15.0)
Immature Granulocytes: 3 %
Lymphocytes Relative: 12 %
Lymphs Abs: 1.5 10*3/uL (ref 0.7–4.0)
MCH: 28.7 pg (ref 26.0–34.0)
MCHC: 31.5 g/dL (ref 30.0–36.0)
MCV: 91.1 fL (ref 80.0–100.0)
Monocytes Absolute: 0.9 10*3/uL (ref 0.1–1.0)
Monocytes Relative: 7 %
NEUTROS ABS: 9.6 10*3/uL — AB (ref 1.7–7.7)
Neutrophils Relative %: 78 %
Platelets: 241 10*3/uL (ref 150–400)
RBC: 3.8 MIL/uL — AB (ref 3.87–5.11)
RDW: 16.3 % — ABNORMAL HIGH (ref 11.5–15.5)
WBC: 12.3 10*3/uL — ABNORMAL HIGH (ref 4.0–10.5)
nRBC: 0 % (ref 0.0–0.2)

## 2018-01-21 LAB — COMPREHENSIVE METABOLIC PANEL
ALT: 15 U/L (ref 0–44)
AST: 18 U/L (ref 15–41)
Albumin: 2.9 g/dL — ABNORMAL LOW (ref 3.5–5.0)
Alkaline Phosphatase: 65 U/L (ref 38–126)
Anion gap: 14 (ref 5–15)
BUN: 48 mg/dL — ABNORMAL HIGH (ref 8–23)
CO2: 27 mmol/L (ref 22–32)
Calcium: 9.2 mg/dL (ref 8.9–10.3)
Chloride: 92 mmol/L — ABNORMAL LOW (ref 98–111)
Creatinine, Ser: 5.71 mg/dL — ABNORMAL HIGH (ref 0.44–1.00)
GFR calc non Af Amer: 7 mL/min — ABNORMAL LOW (ref >60)
GFR, EST AFRICAN AMERICAN: 8 mL/min — AB (ref >60)
Glucose, Bld: 553 mg/dL (ref 70–99)
Potassium: 4.6 mmol/L (ref 3.5–5.1)
Sodium: 133 mmol/L — ABNORMAL LOW (ref 135–145)
Total Bilirubin: 0.9 mg/dL (ref 0.3–1.2)
Total Protein: 7.6 g/dL (ref 6.5–8.1)

## 2018-01-21 LAB — CULTURE, BLOOD (ROUTINE X 2)
CULTURE: NO GROWTH
CULTURE: NO GROWTH
SPECIAL REQUESTS: ADEQUATE
Special Requests: ADEQUATE

## 2018-01-21 LAB — GLUCOSE, CAPILLARY
Glucose-Capillary: 499 mg/dL — ABNORMAL HIGH (ref 70–99)
Glucose-Capillary: 297 mg/dL — ABNORMAL HIGH (ref 70–99)
Glucose-Capillary: 188 mg/dL — ABNORMAL HIGH (ref 70–99)
Glucose-Capillary: 145 mg/dL — ABNORMAL HIGH (ref 70–99)
Glucose-Capillary: 325 mg/dL — ABNORMAL HIGH (ref 70–99)

## 2018-01-21 LAB — PHOSPHORUS: PHOSPHORUS: 4 mg/dL (ref 2.5–4.6)

## 2018-01-21 LAB — MAGNESIUM: Magnesium: 2.4 mg/dL (ref 1.7–2.4)

## 2018-01-21 MED ORDER — HEPARIN SODIUM (PORCINE) 1000 UNIT/ML IJ SOLN
INTRAMUSCULAR | Status: AC
  Filled 2018-01-21: qty 9

## 2018-01-21 MED ORDER — HEPARIN SODIUM (PORCINE) 1000 UNIT/ML DIALYSIS: 1000.0000 [IU] | INTRAMUSCULAR | Status: DC | PRN

## 2018-01-21 MED ORDER — INSULIN ASPART 100 UNIT/ML ~~LOC~~ SOLN
20.0000 [IU] | Freq: Once | SUBCUTANEOUS | Status: AC
  Administered 2018-01-21: 20 [IU] via SUBCUTANEOUS

## 2018-01-21 MED ORDER — INSULIN ASPART 100 UNIT/ML ~~LOC~~ SOLN
0.0000 [IU] | Freq: Every day | SUBCUTANEOUS | Status: DC
  Administered 2018-01-21: 4 [IU] via SUBCUTANEOUS
  Administered 2018-01-22: 3 [IU] via SUBCUTANEOUS
  Administered 2018-01-23: 2 [IU] via SUBCUTANEOUS

## 2018-01-21 MED ORDER — MIDODRINE HCL 5 MG PO TABS
ORAL_TABLET | ORAL | Status: AC
  Filled 2018-01-21: qty 2

## 2018-01-21 MED ORDER — CALCITRIOL 0.5 MCG PO CAPS
ORAL_CAPSULE | ORAL | Status: AC
  Filled 2018-01-21: qty 5

## 2018-01-21 MED ORDER — SODIUM CHLORIDE 0.9 % IV SOLN: 100.0000 mL | INTRAVENOUS | Status: DC | PRN

## 2018-01-21 MED ORDER — METHYLPREDNISOLONE SODIUM SUCC 125 MG IJ SOLR
60.0000 mg | Freq: Three times a day (TID) | INTRAMUSCULAR | Status: AC
  Administered 2018-01-21: 60 mg via INTRAVENOUS
  Filled 2018-01-21: qty 2

## 2018-01-21 MED ORDER — LIDOCAINE-PRILOCAINE 2.5-2.5 % EX CREA: 1.0000 "application " | TOPICAL_CREAM | CUTANEOUS | Status: DC | PRN

## 2018-01-21 MED ORDER — DARBEPOETIN ALFA 60 MCG/0.3ML IJ SOSY
PREFILLED_SYRINGE | INTRAMUSCULAR | Status: AC
  Filled 2018-01-21: qty 0.3

## 2018-01-21 MED ORDER — LIDOCAINE HCL (PF) 1 % IJ SOLN: 5.0000 mL | INTRAMUSCULAR | Status: DC | PRN

## 2018-01-21 MED ORDER — INSULIN ASPART 100 UNIT/ML ~~LOC~~ SOLN
0.0000 [IU] | Freq: Three times a day (TID) | SUBCUTANEOUS | Status: DC
  Administered 2018-01-22: 3 [IU] via SUBCUTANEOUS
  Administered 2018-01-22: 1 [IU] via SUBCUTANEOUS
  Administered 2018-01-22: 7 [IU] via SUBCUTANEOUS
  Administered 2018-01-23: 2 [IU] via SUBCUTANEOUS
  Administered 2018-01-24 (×2): 1 [IU] via SUBCUTANEOUS

## 2018-01-21 MED ORDER — HEPARIN SODIUM (PORCINE) 1000 UNIT/ML DIALYSIS
9000.0000 [IU] | Freq: Once | INTRAMUSCULAR | Status: AC
  Administered 2018-01-21: 9000 [IU] via INTRAVENOUS_CENTRAL

## 2018-01-21 MED ORDER — INSULIN GLARGINE 100 UNIT/ML ~~LOC~~ SOLN
6.0000 [IU] | Freq: Every day | SUBCUTANEOUS | Status: DC
  Administered 2018-01-21 – 2018-01-23 (×3): 6 [IU] via SUBCUTANEOUS
  Filled 2018-01-21 (×4): qty 0.06

## 2018-01-21 MED ORDER — ALTEPLASE 2 MG IJ SOLR: 2.0000 mg | Freq: Once | INTRAMUSCULAR | Status: DC | PRN

## 2018-01-21 MED ORDER — PENTAFLUOROPROP-TETRAFLUOROETH EX AERO: 1.0000 "application " | INHALATION_SPRAY | CUTANEOUS | Status: DC | PRN

## 2018-01-21 NOTE — Procedures (Signed)
I was present at this dialysis session. I have reviewed the session itself and made appropriate changes.  Back on outpatient TTS Schedule.  Might need EDW lowered at DC.    UF goal 2L, 2K; 1.2L up on outpt EDW, will likely lower at DC. Next HD 1/4  Filed Weights   01/20/18 0713 01/20/18 1155 01/21/18 0455  Weight: 128.4 kg 126.1 kg 126.4 kg    Recent Labs  Lab 01/21/18 0329  NA 133*  K 4.6  CL 92*  CO2 27  GLUCOSE 553*  BUN 48*  CREATININE 5.71*  CALCIUM 9.2  PHOS 4.0    Recent Labs  Lab 01/16/18 1822  01/19/18 0742 01/20/18 0403 01/21/18 0329  WBC 10.1   < > 14.1* 11.8* 12.3*  NEUTROABS 5.8  --   --  9.7* 9.6*  HGB 10.9*   < > 10.1* 10.7* 10.9*  HCT 35.8*   < > 31.6* 33.4* 34.6*  MCV 94.2   < > 92.4 91.8 91.1  PLT 183   < > 251 249 241   < > = values in this interval not displayed.    Scheduled Meds: . arformoterol  15 mcg Nebulization BID  . aspirin EC  81 mg Oral Daily  . atorvastatin  40 mg Oral Daily  . azithromycin  500 mg Oral Daily  . budesonide (PULMICORT) nebulizer solution  0.25 mg Nebulization BID  . calcitRIOL      . calcitRIOL  2.5 mcg Oral Q T,Th,Sa-HD  . Chlorhexidine Gluconate Cloth  6 each Topical Q0600  . Chlorhexidine Gluconate Cloth  6 each Topical Q0600  . cinacalcet  90 mg Oral Q breakfast  . darbepoetin (ARANESP) injection - DIALYSIS  60 mcg Intravenous Q Thu-HD  . diphenhydrAMINE  50 mg Oral Q T,Th,Sa-HD  . guaiFENesin  1,200 mg Oral BID  . heparin  5,000 Units Subcutaneous Q8H  . insulin aspart  0-9 Units Subcutaneous TID WC  . insulin glargine  4 Units Subcutaneous QHS  . ipratropium-albuterol  3 mL Nebulization Q6H  . methylPREDNISolone (SOLU-MEDROL) injection  60 mg Intravenous Q8H  . midodrine      . midodrine  10 mg Oral TID  . multivitamin with minerals  1 tablet Oral Daily  . pentafluoroprop-tetrafluoroeth  1 application Topical Q T,Th,Sa-HD  . sevelamer carbonate  1,600 mg Oral TID WC   Continuous Infusions: . sodium  chloride    . sodium chloride     PRN Meds:.sodium chloride, sodium chloride, acetaminophen, albuterol, alteplase, benzonatate, heparin, hydrALAZINE, lidocaine (PF), lidocaine-prilocaine, ondansetron (ZOFRAN) IV, oxyCODONE-acetaminophen, pentafluoroprop-tetrafluoroeth, zolpidem   Pearson Grippe  MD 01/21/2018, 8:37 AM

## 2018-01-21 NOTE — Progress Notes (Signed)
Inpatient Diabetes Program Recommendations  AACE/ADA: New Consensus Statement on Inpatient Glycemic Control (2015)  Target Ranges:  Prepandial:   less than 140 mg/dL      Peak postprandial:   less than 180 mg/dL (1-2 hours)      Critically ill patients:  140 - 180 mg/dL   Lab Results  Component Value Date   GLUCAP 297 (H) 01/21/2018   HGBA1C 5.7 (H) 01/18/2018    Review of Glycemic Control Results for [name], [name] (MRN [id number]) as of 01/21/2018 11:43  Ref. Range 01/20/2018 17:00 01/20/2018 20:09 01/21/2018 04:59 01/21/2018 07:28  Glucose-Capillary Latest Ref Range: 70 - 99 mg/dL 226 (H) 273 (H) 499 (H) 297 (H)   Diabetes history: Type 2 DM Outpatient Diabetes medications: Lantus 5 units QHS Current orders for Inpatient glycemic control: Lantus 4 units QHS, Novolog 0-9 units TID Novolog 20 units x 1  Solumedrol 60 mg Q8H  Inpatient Diabetes Program Recommendations:    Noted increase of steroids, per MD note to continue for 5 days. In this case, consider increasing Lantus to 6 units QHS and adding Novolog 0-5 units QHS.  Thanks, Bronson Curb, MSN, RNC-OB Diabetes Coordinator [phone] (8a-5p)

## 2018-01-21 NOTE — Progress Notes (Signed)
PROGRESS NOTE    [name]  WUJ:[id number] DOB: 03-29-1945 DOA: 01/16/2018 PCP: [name], MD   Brief Narrative:   [name] is a 73 y.o. year old female with medical history significant for ESRD on HD TThSat, HTN, type 2 diabetes, diastolic CHF, CAD, OSA on CPAP, former smoker, HLD and other comorbids who presented on 01/16/2018 with 2 to 3 days of worsening nonproductive cough and shortness of breath with no improvement with home inhaler regimen and was found to have acute bronchitis/COPD exacerbation secondary to rhinovirus infection. She continued to wheeze significantly but feels as if her breathing is slowly improving.  That her breathing is about the same and had pulmonary evaluation they made recommendations which we have implemented.  Dr. Loanne Drilling feels patient's dyspnea is more related to her emphysema and recommends completing a 5-day course of steroids along with 5 days of azithromycin which is today.  She recommends while inpatient continue DuoNeb 3 times daily and Pulmicort twice daily as well as Brovana twice daily.  Assessment & Plan:   Principal Problem:   COPD exacerbation ([place]) Active Problems:   OSA on CPAP   Coronary artery disease   ESRD needing dialysis ([place])   Type II diabetes mellitus with renal manifestations (HCC)   Chest pain   Chronic diastolic CHF (congestive heart failure) (HCC)   Hypotension   Acute bronchitis   Essential hypertension   HLD (hyperlipidemia)   Acute bronchitis due to Rhinovirus   Respiratory distress, acute  Acute Bronchitis/COPD flare secondary to Rhinovirus.  -Still has some dyspnea, diffuse coarse wheezing despite recent breathing treatments.  Reevaluated and feel that this dyspnea is more related to her baseline emphysema and recommending completing a 5-day course of steroids with no taper as well as azithromycin  -Needs to stay for inpatient course due to significant respiratory distress from the virus exacerbating  her COPD. -IV steroids to be discontinued today as they were started on 01/08/2018 -PRN albuterol nebs and will place the patient on guaifenesin 1200 mg p.o. twice daily, flutter valve, incentive spirometer -DuoNeb 3 mL 3 times daily -Solu-Medrol was increased to 60 mg every 8 but will be discontinued today with no taper at the recommendation of pulmonary -Discontinued Incruse Ellipta and pulmonary has adjusted medications and they have started the patient on Brovana twice daily -Sputum Cx showed Normal Respiratory Flora -Continue with Pulmicort twice daily -C/w Benzonatate 200 mg po TID -Will C/w Azithromycin but increase dose to 500 mg po until today as she completes 5 days -Since she did not significantly improve and is still dyspenic with "shallow breathing" will place on Budesonide -Will also consult Pulmonary for further evaluation and Recommendations -Repeat CXR done yesterday showed "Stable cardiomegaly. Atherosclerosis of thoracic aorta is noted. No pneumothorax or pleural effusion is noted. No acute pulmonary disease is noted. Bony thorax is unremarkable."  ESRD on HD -Adherent with regimen.  On Tuesday Thursday Saturday schedule, nephrology following and appreciate further evaluation and recommendations  -Has been dialyzed for the last 3 days and BUN/creatinine was 48/5.71 this morning  Secondary Hyperparathyroidism. -Phos was 4.0 -Continue home Renvela and Cinacalcet  Bone mineral disease related to ESRD.   -Continue home Calcitriol 2.5 mcg TThSat with HD.  Chronic Anemia and Anemia of CKD.   -Continue Aranesp 60 qweek with dialysis.   -Hb/Hct went from 10.3/32.9 -> 10.1/31.6 -> 10.7/33.4 -> 10.9/34.6 -Per Nephro if Hgb drops can give a short course of IV Fe  -Continuet to Monitor CBC  Hypertension, at goal.   -Prone to low BP at dialysis.  Continue home Midodrine. -Have not needed to resume home carvedilol (12.5 twice daily) yet -BP this a.m. was 91/56 but now is  125/59 -Continue to Monitor Closely  Hyperlipidemia -Stable.   -Continue Atorvastatin 40 mg po Daily  Type 2 Diabetes  -A1c 5.7 (12/19).   -Monitor CBGs. CBGs ranging from 1 45-4 99 -diabetes education coronary consulted and recommending place the patient on Lantus 6 units nightly as well as place the patient on sensitive NovoLog sliding scale before meals and at bedtime We will continue monitor CBGs carefully and will need to monitor closely now that we will be discontinuing steroids  Morbid Obesity  -Estimated body mass index is 44.44 kg/m as calculated from the following:   Height as of this encounter: 5\' 6"  (1.676 m).   Weight as of this encounter: 124.9 kg. -Weight Loss Counseling given  Hyponatremia -Mild at 133 -Expect to correct with Dialysis -Continue to Monitor and Repeat CMP in AM  Leukocytosis -Patient's WBC went from 11.8 is now 12.3 -Likely reactive in the setting of steroid demargination -Continue to monitor for signs and symptoms infection -Repeat CBC in the a.m.  DVT prophylaxis: Heparin 5,000 sq q8h Code Status: FULL CODE Family Communication: No family present at bedside Disposition Plan: Remain Inpatient for continued workup and treatment and improvement of respiratory status  Consultants:   Nephrology    Procedures:  Dialysis    Antimicrobials:  Anti-infectives (From admission, onward)   Start     Dose/Rate Route Frequency Ordered Stop   01/20/18 1000  azithromycin (ZITHROMAX) tablet 500 mg     500 mg Oral Daily 01/20/18 0934 01/21/18 1159   01/18/18 1000  azithromycin (ZITHROMAX) tablet 250 mg  Status:  Discontinued     250 mg Oral Daily 01/16/18 2048 01/20/18 0934   01/17/18 1000  azithromycin (ZITHROMAX) tablet 500 mg     500 mg Oral Daily 01/16/18 2048 01/17/18 0811   01/16/18 2000  vancomycin (VANCOCIN) 2,000 mg in sodium chloride 0.9 % 500 mL IVPB     2,000 mg 250 mL/hr over 120 Minutes Intravenous  Once 01/16/18 1953 01/16/18 2315     01/16/18 1945  ceFEPIme (MAXIPIME) 2 g in sodium chloride 0.9 % 100 mL IVPB     2 g 200 mL/hr over 30 Minutes Intravenous  Once 01/16/18 1933 01/16/18 2056     Subjective: Seen and examined again in dialysis he was still very dyspneic to speak.  States that her shortness breath feels about the same.  No nausea or vomiting.  States that her Spiriva did not work when she tried it at home.  No other concerns or complaints at this time and feels that her breathing has slowly improved but is about the same as yesterday.  No chest pain but does have some abdominal pain from coughing  Objective: Vitals:   01/21/18 1128 01/21/18 1137 01/21/18 1154 01/21/18 1429  BP: (!) 101/41 (!) 94/53 (!) 91/56 (!) 125/59  Pulse: 73 88 69 80  Resp:   (!) 25 20  Temp:    98.9 F (37.2 C)  TempSrc:    Oral  SpO2:   96% 99%  Weight:      Height:        Intake/Output Summary (Last 24 hours) at 01/21/2018 1903 Last data filed at 01/21/2018 1102 Gross per 24 hour  Intake 240 ml  Output 2000 ml  Net -1760 ml  Filed Weights   01/21/18 0455 01/21/18 0755 01/21/18 1102  Weight: 126.4 kg 126.7 kg 124.9 kg   Examination: Physical Exam:  Constitutional: Well-nourished, well-developed obese African-American female currently no acute distress laying in bed getting dialysis and does appear somewhat uncomfortable with persistent coughing Eyes: Conjunctive are normal.  Sclera anicteric ENMT: External ears and nose appear normal.  Mucous members are moist Neck: Appears supple no JVD Respiratory: Again very diminished to auscultation bilaterally with some wheezing and very coarse breath sounds.  Has trouble speaking in full sentences because of dyspnea.  Also has a nonproductive cough and goes into mild respiratory distress when she coughs.  Not wearing supplemental oxygen via nasal cannula and she is not hypoxic Cardiovascular: Regular rate and rhythm.  No appreciable murmurs, rubs, gallops.  Has trace lower  extremity edema Abdomen: Soft, nontender, distended secondary to body habitus.  Bowel sounds present GU: Deferred Musculoskeletal: Contractures or cyanosis.  No joint deformities noted  Skin: Skin is warm and dry with no appreciable rashes or lesions on a limited skin evaluation.  She is getting dialysis through her left upper arm fistula Neurologic: Cranial nerves II through XII grossly intact no appreciable focal deficits Psychiatric: Normal judgment and insight.  Patient is awake alert and oriented x3.  Normal mood affect  Data Reviewed: I have personally reviewed following labs and imaging studies  CBC: Recent Labs  Lab 01/16/18 1822 01/16/18 1846 01/18/18 0402 01/19/18 0742 01/20/18 0403 01/21/18 0329  WBC 10.1  --  15.0* 14.1* 11.8* 12.3*  NEUTROABS 5.8  --   --   --  9.7* 9.6*  HGB 10.9* 12.2 10.3* 10.1* 10.7* 10.9*  HCT 35.8* 36.0 32.9* 31.6* 33.4* 34.6*  MCV 94.2  --  91.9 92.4 91.8 91.1  PLT 183  --  222 251 249 161   Basic Metabolic Panel: Recent Labs  Lab 01/16/18 1822 01/16/18 1846 01/18/18 0402 01/19/18 0742 01/20/18 0403 01/21/18 0329  NA 141 141 137 134* 134* 133*  K 4.2 4.1 5.1 4.7 5.1 4.6  CL 97* 99 95* 97* 95* 92*  CO2 28  --  25 22 29 27   GLUCOSE 106* 102* 330* 327* 351* 553*  BUN 29* 34* 68* 102* 53* 48*  CREATININE 6.97* 7.00* 10.21* 12.22* 7.25* 5.71*  CALCIUM 8.9  --  8.8* 8.3* 9.0 9.2  MG  --   --   --   --  2.4 2.4  PHOS  --   --   --  3.5 4.3 4.0   GFR: Estimated Creatinine Clearance: 11.8 mL/min (A) (by C-G formula based on SCr of 5.71 mg/dL (H)). Liver Function Tests: Recent Labs  Lab 01/19/18 0742 01/20/18 0403 01/21/18 0329  AST  --  20 18  ALT  --  14 15  ALKPHOS  --  66 65  BILITOT  --  0.7 0.9  PROT  --  7.4 7.6  ALBUMIN 2.8* 3.0* 2.9*   No results for input(s): LIPASE, AMYLASE in the last 168 hours. No results for input(s): AMMONIA in the last 168 hours. Coagulation Profile: Recent Labs  Lab 01/16/18 1822  INR 1.13    Cardiac Enzymes: No results for input(s): CKTOTAL, CKMB, CKMBINDEX, TROPONINI in the last 168 hours. BNP (last 3 results) No results for input(s): PROBNP in the last 8760 hours. HbA1C: No results for input(s): HGBA1C in the last 72 hours. CBG: Recent Labs  Lab 01/20/18 2009 01/21/18 0459 01/21/18 0728 01/21/18 1148 01/21/18 1651  GLUCAP 273* 499*  297* 188* 145*   Lipid Profile: No results for input(s): CHOL, HDL, LDLCALC, TRIG, CHOLHDL, LDLDIRECT in the last 72 hours. Thyroid Function Tests: No results for input(s): TSH, T4TOTAL, FREET4, T3FREE, THYROIDAB in the last 72 hours. Anemia Panel: No results for input(s): VITAMINB12, FOLATE, FERRITIN, TIBC, IRON, RETICCTPCT in the last 72 hours. Sepsis Labs: Recent Labs  Lab 01/16/18 1847 01/16/18 2012  LATICACIDVEN 1.26 1.47    Recent Results (from the past 240 hour(s))  Blood culture (routine x 2)     Status: None   Collection Time: 01/16/18  7:38 PM  Result Value Ref Range Status   Specimen Description BLOOD BLOOD RIGHT WRIST  Final   Special Requests   Final    BOTTLES DRAWN AEROBIC AND ANAEROBIC Blood Culture adequate volume   Culture   Final    NO GROWTH 5 DAYS Performed at [facility], [address] [place], [place] [address]    Report Status 01/21/2018 FINAL  Final  Blood culture (routine x 2)     Status: None   Collection Time: 01/16/18  7:42 PM  Result Value Ref Range Status   Specimen Description BLOOD BLOOD RIGHT HAND  Final   Special Requests   Final    BOTTLES DRAWN AEROBIC AND ANAEROBIC Blood Culture adequate volume   Culture   Final    NO GROWTH 5 DAYS Performed at [facility], [place] [address] [place], [place] [address]    Report Status 01/21/2018 FINAL  Final  Respiratory Panel by PCR     Status: Abnormal   Collection Time: 01/16/18  8:55 PM  Result Value Ref Range Status   Adenovirus NOT DETECTED NOT DETECTED Final   Coronavirus 229E NOT DETECTED NOT DETECTED Final   Coronavirus  HKU1 NOT DETECTED NOT DETECTED Final   Coronavirus NL63 NOT DETECTED NOT DETECTED Final   Coronavirus OC43 NOT DETECTED NOT DETECTED Final   Metapneumovirus NOT DETECTED NOT DETECTED Final   Rhinovirus / Enterovirus DETECTED (A) NOT DETECTED Final   Influenza A NOT DETECTED NOT DETECTED Final   Influenza B NOT DETECTED NOT DETECTED Final   Parainfluenza Virus 1 NOT DETECTED NOT DETECTED Final   Parainfluenza Virus 2 NOT DETECTED NOT DETECTED Final   Parainfluenza Virus 3 NOT DETECTED NOT DETECTED Final   Parainfluenza Virus 4 NOT DETECTED NOT DETECTED Final   Respiratory Syncytial Virus NOT DETECTED NOT DETECTED Final   Bordetella pertussis NOT DETECTED NOT DETECTED Final   Chlamydophila pneumoniae NOT DETECTED NOT DETECTED Final   Mycoplasma pneumoniae NOT DETECTED NOT DETECTED Final  MRSA PCR Screening     Status: None   Collection Time: 01/17/18  3:33 AM  Result Value Ref Range Status   MRSA by PCR NEGATIVE NEGATIVE Final    Comment:        The GeneXpert MRSA Assay (FDA approved for NASAL specimens only), is one component of a comprehensive MRSA colonization surveillance program. It is not intended to diagnose MRSA infection nor to guide or monitor treatment for MRSA infections. Performed at [facility], [place] [address], [place], [place] [address]   Culture, sputum-assessment     Status: None   Collection Time: 01/17/18  3:56 AM  Result Value Ref Range Status   Specimen Description SPUTUM  Final   Special Requests   Final    NONE Performed at [facility], [place] [address] [place],  [address]    Sputum evaluation   Final  Sputum specimen not acceptable for testing.  Please recollect.   RESULT CALLED TO, READ BACK BY AND VERIFIED WITH: RN Boyd Kerbs [phone] FCP    Report Status 01/17/2018 FINAL  Final  Expectorated sputum assessment w rflx to resp cult     Status: None   Collection Time: 01/17/18 12:49 PM  Result Value Ref Range Status    Specimen Description SPUTUM  Final   Special Requests Normal  Final   Sputum evaluation   Final    RARE WBC PRESENT, PREDOMINANTLY PMN RARE SQUAMOUS EPITHELIAL CELLS PRESENT FEW GRAM POSITIVE COCCI FEW GRAM NEGATIVE RODS THIS SPECIMEN IS ACCEPTABLE FOR SPUTUM CULTURE Performed at [facility], [place] [address] [place], [place] [address]    Report Status 01/17/2018 FINAL  Final  Culture, respiratory     Status: None   Collection Time: 01/17/18 12:49 PM  Result Value Ref Range Status   Specimen Description SPUTUM  Final   Special Requests Normal Reflexed from [id number]  Final   Gram Stain   Final    RARE WBC PRESENT, PREDOMINANTLY PMN NO SQUAMOUS EPITHELIAL CELLS SEEN FEW GRAM POSITIVE COCCI RARE GRAM NEGATIVE RODS RARE GRAM POSITIVE RODS    Culture   Final    FEW Consistent with normal respiratory flora. Performed at [facility], [place] [address], [place], [place] [address]    Report Status 01/20/2018 FINAL  Final    Radiology Studies: Dg Chest Port 1 View  Result Date: 01/20/2018 CLINICAL DATA:  Dyspnea. EXAM: PORTABLE CHEST 1 VIEW COMPARISON:  Radiograph of January 16, 2018. FINDINGS: Stable cardiomegaly. Atherosclerosis of thoracic aorta is noted. No pneumothorax or pleural effusion is noted. No acute pulmonary disease is noted. Bony thorax is unremarkable. IMPRESSION: No acute cardiopulmonary abnormality seen. Aortic Atherosclerosis (ICD10-I70.0). Electronically Signed   By: Marijo Conception, M.D.   On: 01/20/2018 13:42    Scheduled Meds: . arformoterol  15 mcg Nebulization BID  . aspirin EC  81 mg Oral Daily  . atorvastatin  40 mg Oral Daily  . budesonide (PULMICORT) nebulizer solution  0.25 mg Nebulization BID  . calcitRIOL  2.5 mcg Oral Q T,Th,Sa-HD  . Chlorhexidine Gluconate Cloth  6 each Topical Q0600  . Chlorhexidine Gluconate Cloth  6 each Topical Q0600  . cinacalcet  90 mg Oral Q breakfast  . darbepoetin (ARANESP) injection - DIALYSIS  60 mcg Intravenous Q  Thu-HD  . diphenhydrAMINE  50 mg Oral Q T,Th,Sa-HD  . guaiFENesin  1,200 mg Oral BID  . heparin  5,000 Units Subcutaneous Q8H  . insulin aspart  0-5 Units Subcutaneous QHS  . [START ON 01/22/2018] insulin aspart  0-9 Units Subcutaneous TID WC  . insulin glargine  4 Units Subcutaneous QHS  . insulin glargine  6 Units Subcutaneous QHS  . ipratropium-albuterol  3 mL Nebulization Q6H  . methylPREDNISolone (SOLU-MEDROL) injection  60 mg Intravenous Q8H  . midodrine  10 mg Oral TID  . multivitamin with minerals  1 tablet Oral Daily  . pentafluoroprop-tetrafluoroeth  1 application Topical Q T,Th,Sa-HD  . sevelamer carbonate  1,600 mg Oral TID WC   Continuous Infusions:   LOS: 3 days   Kerney Elbe, DO [facility] PAGER is on [place]  If 7PM-7AM, please contact night-coverage [email or web address] Password [facility] 01/21/2018, 7:03 PM

## 2018-01-22 LAB — COMPREHENSIVE METABOLIC PANEL
ALT: 16 U/L (ref 0–44)
AST: 18 U/L (ref 15–41)
Albumin: 3.1 g/dL — ABNORMAL LOW (ref 3.5–5.0)
Alkaline Phosphatase: 66 U/L (ref 38–126)
Anion gap: 15 (ref 5–15)
BUN: 57 mg/dL — ABNORMAL HIGH (ref 8–23)
CALCIUM: 9.1 mg/dL (ref 8.9–10.3)
CO2: 25 mmol/L (ref 22–32)
Chloride: 94 mmol/L — ABNORMAL LOW (ref 98–111)
Creatinine, Ser: 6.19 mg/dL — ABNORMAL HIGH (ref 0.44–1.00)
GFR calc Af Amer: 7 mL/min — ABNORMAL LOW (ref >60)
GFR calc non Af Amer: 6 mL/min — ABNORMAL LOW (ref >60)
Glucose, Bld: 336 mg/dL — ABNORMAL HIGH (ref 70–99)
Potassium: 4.1 mmol/L (ref 3.5–5.1)
Sodium: 134 mmol/L — ABNORMAL LOW (ref 135–145)
Total Bilirubin: 1 mg/dL (ref 0.3–1.2)
Total Protein: 7.7 g/dL (ref 6.5–8.1)

## 2018-01-22 LAB — GLUCOSE, CAPILLARY
Glucose-Capillary: 303 mg/dL — ABNORMAL HIGH (ref 70–99)
Glucose-Capillary: 230 mg/dL — ABNORMAL HIGH (ref 70–99)
Glucose-Capillary: 147 mg/dL — ABNORMAL HIGH (ref 70–99)
Glucose-Capillary: 296 mg/dL — ABNORMAL HIGH (ref 70–99)

## 2018-01-22 LAB — PHOSPHORUS: Phosphorus: 4.7 mg/dL — ABNORMAL HIGH (ref 2.5–4.6)

## 2018-01-22 LAB — MAGNESIUM: Magnesium: 2.3 mg/dL (ref 1.7–2.4)

## 2018-01-22 MED ORDER — IPRATROPIUM-ALBUTEROL 0.5-2.5 (3) MG/3ML IN SOLN
3.0000 mL | Freq: Four times a day (QID) | RESPIRATORY_TRACT | Status: DC
  Administered 2018-01-22 – 2018-01-24 (×9): 3 mL via RESPIRATORY_TRACT
  Filled 2018-01-22 (×10): qty 3

## 2018-01-22 NOTE — Consult Note (Signed)
   [facility] Inpatient Consult   01/22/2018  [name] Nov 27, 1945 [id number]    Patient screened for potential [facility] Care Management services due to unplanned readmission risk score of 27% (high).  However, [name] is eligible/enrolled in the [place]. This program provides case management services. Therefore, [facility] Care Management will not follow due to duplication of services.   Will make inpatient RNCM aware.   Marthenia Rolling, MSN-Ed, RN,BSN [facility] Liaison [phone]

## 2018-01-22 NOTE — Plan of Care (Signed)
  Problem: Clinical Measurements: Goal: Will remain free from infection Outcome: Progressing Note:  No s/s of infection noted. Goal: Respiratory complications will improve Outcome: Progressing Note:  Stable on room air.  No s/s of respiratory complications noted. Goal: Cardiovascular complication will be avoided Outcome: Progressing Note:  No s/s of cardiovascular complications.   Problem: Coping: Goal: Level of anxiety will decrease Outcome: Progressing Note:  No s/s of anxiety noted.   Problem: Pain Managment: Goal: General experience of comfort will improve Outcome: Progressing Note:  Denies c/o pain or discomfort.

## 2018-01-22 NOTE — Progress Notes (Signed)
Pt placed on CPAP tolerating well no issues to report 

## 2018-01-22 NOTE — Progress Notes (Signed)
[facility] Progress Note   Subjective:  Sitting up in chair at bedside. Breathing improving some, but still feels bad with coughing.  HD yesterday with net UF 2L   Objective Vitals:   01/21/18 1941 01/21/18 2054 01/22/18 0027 01/22/18 0501  BP:  (!) 115/54  (!) 152/62  Pulse:  64  (!) 58  Resp:  (!) 21 18 20   Temp:  98.6 F (37 C)  98 F (36.7 C)  TempSrc:  Oral  Axillary  SpO2: 98% 100% 100% 97%  Weight:    126.6 kg  Height:       Physical Exam General: Obese female NAD Heart: RRR Lungs: occasional crackles at bases Abdomen: soft NT Extremities: no LE edema  Dialysis Access: LUE AVF+ bruit   Dialysis Orders: [place] T,Th,S 4.5 hrs 180NRe 450/Autoflow 1.5 125.5 kg 2.0 K/ 2.0 Ca  LUA AVF -Heparin 13,000 units IV TIW -Mircera 60 mcg IV q 2 weeks (last dose 01/07/18 Last HGB 10.1 Tsat 14 Iron 28 01/14/18) -Calcitriol 2.5 mcg PO TIW (last PTH 511 Ca 8.9 C Ca 9.1 Phos 3.8 01/14/18)  Assessment/Plan: 1.Exacerbation of COPD/bronchitis - per primary+ rhinovirus/enterovirus/ steroids/breathing treatment/azithromycin/benzonatate - former smoker 2. ESRD- TTS. Continue on schedule. Next HD Saturday.  3. Anemia- hgb >10. Stable.  Continue Aranesp 60 q week 4. Secondary hyperparathyroidism- binders/VDRA ordered Ca/P ok 5.HTN/volume  - prone to low BP on dialysis. CXR clear on 1/1. Got below EDW on 1/2 post wt 124.9kg. 6. Nutrition- changed to renal carb mod/vits 7. DM - per primary -BS remains elevated 300s   Lynnda Child PA-C [facility] Pager [phone] 01/22/2018,9:05 AM  LOS: 4 days   Additional Objective Labs: Basic Metabolic Panel: Recent Labs  Lab 01/19/18 0742 01/20/18 0403 01/21/18 0329  NA 134* 134* 133*  K 4.7 5.1 4.6  CL 97* 95* 92*  CO2 22 29 27   GLUCOSE 327* 351* 553*  BUN 102* 53* 48*  CREATININE 12.22* 7.25* 5.71*  CALCIUM 8.3* 9.0 9.2  PHOS 3.5 4.3 4.0   CBC: Recent Labs  Lab 01/16/18 1822   01/18/18 0402 01/19/18 0742 01/20/18 0403 01/21/18 0329  WBC 10.1  --  15.0* 14.1* 11.8* 12.3*  NEUTROABS 5.8  --   --   --  9.7* 9.6*  HGB 10.9*   < > 10.3* 10.1* 10.7* 10.9*  HCT 35.8*   < > 32.9* 31.6* 33.4* 34.6*  MCV 94.2  --  91.9 92.4 91.8 91.1  PLT 183  --  222 251 249 241   < > = values in this interval not displayed.   Blood Culture    Component Value Date/Time   SDES SPUTUM 01/17/2018 1249   SDES SPUTUM 01/17/2018 1249   SPECREQUEST Normal 01/17/2018 1249   SPECREQUEST Normal Reflexed from [id number] 01/17/2018 1249   CULT  01/17/2018 1249    FEW Consistent with normal respiratory flora. Performed at [facility], [place] [address] [place], [place] [address]    REPTSTATUS 01/17/2018 FINAL 01/17/2018 1249   REPTSTATUS 01/20/2018 FINAL 01/17/2018 1249    Cardiac Enzymes: No results for input(s): CKTOTAL, CKMB, CKMBINDEX, TROPONINI in the last 168 hours. CBG: Recent Labs  Lab 01/21/18 0728 01/21/18 1148 01/21/18 1651 01/21/18 2052 01/22/18 0735  GLUCAP 297* 188* 145* 325* 303*   Iron Studies: No results for input(s): IRON, TIBC, TRANSFERRIN, FERRITIN in the last 72 hours. Lab Results  Component Value Date   INR 1.13 01/16/2018   INR 1.12 04/06/2017  INR 1.10 07/28/2015   Medications:  . arformoterol  15 mcg Nebulization BID  . aspirin EC  81 mg Oral Daily  . atorvastatin  40 mg Oral Daily  . budesonide (PULMICORT) nebulizer solution  0.25 mg Nebulization BID  . calcitRIOL  2.5 mcg Oral Q T,Th,Sa-HD  . Chlorhexidine Gluconate Cloth  6 each Topical Q0600  . Chlorhexidine Gluconate Cloth  6 each Topical Q0600  . cinacalcet  90 mg Oral Q breakfast  . darbepoetin (ARANESP) injection - DIALYSIS  60 mcg Intravenous Q Thu-HD  . diphenhydrAMINE  50 mg Oral Q T,Th,Sa-HD  . guaiFENesin  1,200 mg Oral BID  . heparin  5,000 Units Subcutaneous Q8H  . insulin aspart  0-5 Units Subcutaneous QHS  . insulin aspart  0-9 Units Subcutaneous TID WC  . insulin  glargine  6 Units Subcutaneous QHS  . ipratropium-albuterol  3 mL Nebulization QID  . midodrine  10 mg Oral TID  . multivitamin with minerals  1 tablet Oral Daily  . pentafluoroprop-tetrafluoroeth  1 application Topical Q T,Th,Sa-HD  . sevelamer carbonate  1,600 mg Oral TID WC

## 2018-01-22 NOTE — Progress Notes (Signed)
PROGRESS NOTE    [name]  UVO:[id number] DOB: [birth year] DOA: 01/16/2018 PCP: [name], MD   Brief Narrative:   [name] is a 73 y.o. year old female with medical history significant for ESRD on HD TThSat, HTN, type 2 diabetes, diastolic CHF, CAD, OSA on CPAP, former smoker, HLD and other comorbids who presented on 01/16/2018 with 2 to 3 days of worsening nonproductive cough and shortness of breath with no improvement with home inhaler regimen and was found to have acute bronchitis/COPD exacerbation secondary to rhinovirus infection. She continued to wheeze significantly but feels as if her breathing is slowly improving.  That her breathing is about the same and had pulmonary evaluation they made recommendations which we have implemented.  Dr. Loanne Drilling feels patient's dyspnea is more related to her emphysema and recommends completing a 5-day course of steroids along with 5 days of azithromycin which is today.  She recommends while inpatient continue DuoNeb 3 times daily and Pulmicort twice daily as well as Brovana twice daily. Patient was to be D/C'd today but does not have a ride to Dialysis tomorrow so will keep and Dialyze tomorrow. She ambulated and did not desaturate but became SOB due to Deconditioning. PT has been consulted to evaluate.   Assessment & Plan:   Principal Problem:   COPD exacerbation ([place]) Active Problems:   OSA on CPAP   Coronary artery disease   ESRD needing dialysis ([place])   Type II diabetes mellitus with renal manifestations (HCC)   Chest pain   Chronic diastolic CHF (congestive heart failure) (HCC)   Hypotension   Acute bronchitis   Essential hypertension   HLD (hyperlipidemia)   Acute bronchitis due to Rhinovirus   Respiratory distress, acute  Acute Bronchitis/COPD flare secondary to Rhinovirus, improving   -Still has some dyspnea, diffuse coarse wheezing despite recent breathing treatments.  Reevaluated and feel that this dyspnea is more  related to her baseline emphysema and recommending completing a 5-day course of steroids with no taper as well as azithromycin  -Needs to stay for inpatient course due to significant respiratory distress from the virus exacerbating her COPD. -IV steroids to be discontinued today as they were started on 01/08/2018 -PRN albuterol nebs and will place the patient on guaifenesin 1200 mg p.o. twice daily, flutter valve, incentive spirometer -DuoNeb 3 mL 3 times daily -Solu-Medrol was increased to 60 mg every 8 but will be discontinued today with no taper at the recommendation of pulmonary -Discontinued Incruse Ellipta and pulmonary has adjusted medications and they have started the patient on Brovana twice daily -Sputum Cx showed Normal Respiratory Flora -Continue with Pulmicort twice daily -C/w Benzonatate 200 mg po TID -Will C/w Azithromycin but increase dose to 500 mg po until yesterday as she completes 5 days -Since she did not significantly improve and is still dyspenic with "shallow breathing" will place on Budesonide -Will also consult Pulmonary for further evaluation and Recommendations -Repeat CXR done yesterday showed "Stable cardiomegaly. Atherosclerosis of thoracic aorta is noted. No pneumothorax or pleural effusion is noted. No acute pulmonary disease is noted. Bony thorax is unremarkable." -Home Ambulatory Screen done but she did not desaturate but was visibly SOB and likely from Deconditioning -Will obtain PT/OT to Evaluate and treat   ESRD on HD -Adherent with regimen.  On Tuesday Thursday Saturday schedule, nephrology following and appreciate further evaluation and recommendations  -Did not go to Dialysis today; BUN/Cr is now 57/6.19  Secondary Hyperparathyroidism. -Phos was 4.7 -Continue home Renvela and  Cinacalcet  Bone mineral disease related to ESRD.   -Continue home Calcitriol 2.5 mcg TThSat with HD.  Chronic Anemia and Anemia of CKD.   -Continue Aranesp 60 qweek with  dialysis.   -Hb/Hct went from 10.3/32.9 -> 10.1/31.6 -> 10.7/33.4 -> 10.9/34.6  -Per Nephro if Hgb drops can give a short course of IV Fe  -Continuet to Monitor CBC and repeat in AM   Hypertension, at goal.   -Prone to low BP at dialysis.  Continue home Midodrine. -Have not needed to resume home carvedilol (12.5 twice daily) yet -BP is now 101/55 -Continue to Monitor Closely  Hyperlipidemia -Stable.   -Continue Atorvastatin 40 mg po Daily  Type 2 Diabetes  -A1c 5.7 (12/19).   -Monitor CBGs. CBGs ranging from 147-325 -diabetes education coronary consulted and recommending place the patient on Lantus 6 units nightly as well as place the patient on sensitive NovoLog sliding scale before meals and at bedtime -We will continue monitor CBGs carefully and will need to monitor closely now that we will be discontinuing steroids  Morbid Obesity  -Estimated body mass index is 45.05 kg/m as calculated from the following:   Height as of this encounter: 5\' 6"  (1.676 m).   Weight as of this encounter: 126.6 kg. -Weight Loss Counseling given  Hyponatremia -Mild at 134 -Expect to correct with Dialysis -Continue to Monitor and Repeat CMP in AM  Leukocytosis -Patient's WBC went from 11.8 is now 12.3 -Likely reactive in the setting of steroid demargination -Continue to monitor for signs and symptoms infection -Repeat CBC in the a.m. as one was not done today  DVT prophylaxis: Heparin 5,000 sq q8h Code Status: FULL CODE Family Communication: No family present at bedside Disposition Plan: Was to D/C Home today but does not have a ride to Dialysis tomorrow. Appeared Deconditioned so obtaining PT evaluation   Consultants:   Nephrology    Procedures:  Dialysis    Antimicrobials:  Anti-infectives (From admission, onward)   Start     Dose/Rate Route Frequency Ordered Stop   01/20/18 1000  azithromycin (ZITHROMAX) tablet 500 mg     500 mg Oral Daily 01/20/18 0934 01/21/18 1159    01/18/18 1000  azithromycin (ZITHROMAX) tablet 250 mg  Status:  Discontinued     250 mg Oral Daily 01/16/18 2048 01/20/18 0934   01/17/18 1000  azithromycin (ZITHROMAX) tablet 500 mg     500 mg Oral Daily 01/16/18 2048 01/17/18 0811   01/16/18 2000  vancomycin (VANCOCIN) 2,000 mg in sodium chloride 0.9 % 500 mL IVPB     2,000 mg 250 mL/hr over 120 Minutes Intravenous  Once 01/16/18 1953 01/16/18 2315   01/16/18 1945  ceFEPIme (MAXIPIME) 2 g in sodium chloride 0.9 % 100 mL IVPB     2 g 200 mL/hr over 30 Minutes Intravenous  Once 01/16/18 1933 01/16/18 2056     Subjective: Seen and examined morning and was a little bit dyspneic however she is maintaining her saturations well and did not desaturate speaking.  Has some mild wheezing but is improved from yesterday.  No chest pain, lightheadedness or dizziness.  No nausea or vomiting.  Worried about going home but she lives by herself and does not have a ride dialysis tomorrow.  Ambulated and did not desaturate on home O2 screen but per nurse was visibly dyspneic and a little out of breath.  Will obtain a PT evaluation.  No other concerns or complaints at this time  Objective: Vitals:   01/22/18  4481 01/22/18 1237 01/22/18 1623 01/22/18 1628  BP:    (!) 101/55  Pulse:    73  Resp:    18  Temp:    98.3 F (36.8 C)  TempSrc:    Axillary  SpO2: 97% 92% 96% 96%  Weight:      Height:        Intake/Output Summary (Last 24 hours) at 01/22/2018 1711 Last data filed at 01/22/2018 0859 Gross per 24 hour  Intake 988 ml  Output -  Net 988 ml   Filed Weights   01/21/18 0755 01/21/18 1102 01/22/18 0501  Weight: 126.7 kg 124.9 kg 126.6 kg   Examination: Physical Exam:  Constitutional: Well-nourished, well-developed obese African-American female currently no acute distress in chair bedside appears calm.  Not appearing as dyspneic today and is able to converse without desaturating but appears mildly winded upon speaking  Eyes: Conjunctive are  normal.  Sclera anicteric ENMT: External ears and nose appear normal.  Mucous members are moist Neck: Appears supple with no JVD Respiratory: Diminished to auscultation bilaterally and wheezing is improved but still has some coarse breath sounds.  Not as dyspneic today and is able to converse with me without desaturating.  Has a nonproductive cough.  Not wearing any supplemental oxygen via nasal cannula and is not hypoxic. Cardiovascular: Regular rate and rhythm.  No appreciable murmurs, rubs, gallops.  Has lower extremity edema which is mild Abdomen: Soft, nontender, distended secondary body habitus.  Bowel sounds present GU: Deferred Musculoskeletal: No contractures or cyanosis.  No joint deformities noted Skin: Skin is warm and dry with no appreciable rashes or lesions on limited skin evaluation.  Has a left arm upper fistula Neurologic: Cranial nerves II through XII grossly intact with no appreciable focal deficits Psychiatric: Normal judgment and insight.  Patient is awake and alert and oriented.  Data Reviewed: I have personally reviewed following labs and imaging studies  CBC: Recent Labs  Lab 01/16/18 1822 01/16/18 1846 01/18/18 0402 01/19/18 0742 01/20/18 0403 01/21/18 0329  WBC 10.1  --  15.0* 14.1* 11.8* 12.3*  NEUTROABS 5.8  --   --   --  9.7* 9.6*  HGB 10.9* 12.2 10.3* 10.1* 10.7* 10.9*  HCT 35.8* 36.0 32.9* 31.6* 33.4* 34.6*  MCV 94.2  --  91.9 92.4 91.8 91.1  PLT 183  --  222 251 249 856   Basic Metabolic Panel: Recent Labs  Lab 01/18/18 0402 01/19/18 0742 01/20/18 0403 01/21/18 0329 01/22/18 0848  NA 137 134* 134* 133* 134*  K 5.1 4.7 5.1 4.6 4.1  CL 95* 97* 95* 92* 94*  CO2 25 22 29 27 25   GLUCOSE 330* 327* 351* 553* 336*  BUN 68* 102* 53* 48* 57*  CREATININE 10.21* 12.22* 7.25* 5.71* 6.19*  CALCIUM 8.8* 8.3* 9.0 9.2 9.1  MG  --   --  2.4 2.4 2.3  PHOS  --  3.5 4.3 4.0 4.7*   GFR: Estimated Creatinine Clearance: 11 mL/min (A) (by C-G formula based on  SCr of 6.19 mg/dL (H)). Liver Function Tests: Recent Labs  Lab 01/19/18 0742 01/20/18 0403 01/21/18 0329 01/22/18 0848  AST  --  20 18 18   ALT  --  14 15 16   ALKPHOS  --  66 65 66  BILITOT  --  0.7 0.9 1.0  PROT  --  7.4 7.6 7.7  ALBUMIN 2.8* 3.0* 2.9* 3.1*   No results for input(s): LIPASE, AMYLASE in the last 168 hours. No results for input(s):  AMMONIA in the last 168 hours. Coagulation Profile: Recent Labs  Lab 01/16/18 1822  INR 1.13   Cardiac Enzymes: No results for input(s): CKTOTAL, CKMB, CKMBINDEX, TROPONINI in the last 168 hours. BNP (last 3 results) No results for input(s): PROBNP in the last 8760 hours. HbA1C: No results for input(s): HGBA1C in the last 72 hours. CBG: Recent Labs  Lab 01/21/18 1651 01/21/18 2052 01/22/18 0735 01/22/18 1153 01/22/18 1622  GLUCAP 145* 325* 303* 230* 147*   Lipid Profile: No results for input(s): CHOL, HDL, LDLCALC, TRIG, CHOLHDL, LDLDIRECT in the last 72 hours. Thyroid Function Tests: No results for input(s): TSH, T4TOTAL, FREET4, T3FREE, THYROIDAB in the last 72 hours. Anemia Panel: No results for input(s): VITAMINB12, FOLATE, FERRITIN, TIBC, IRON, RETICCTPCT in the last 72 hours. Sepsis Labs: Recent Labs  Lab 01/16/18 1847 01/16/18 2012  LATICACIDVEN 1.26 1.47    Recent Results (from the past 240 hour(s))  Blood culture (routine x 2)     Status: None   Collection Time: 01/16/18  7:38 PM  Result Value Ref Range Status   Specimen Description BLOOD BLOOD RIGHT WRIST  Final   Special Requests   Final    BOTTLES DRAWN AEROBIC AND ANAEROBIC Blood Culture adequate volume   Culture   Final    NO GROWTH 5 DAYS Performed at [facility], [address], [place], [place] [address]    Report Status 01/21/2018 FINAL  Final  Blood culture (routine x 2)     Status: None   Collection Time: 01/16/18  7:42 PM  Result Value Ref Range Status   Specimen Description BLOOD BLOOD RIGHT HAND  Final   Special Requests    Final    BOTTLES DRAWN AEROBIC AND ANAEROBIC Blood Culture adequate volume   Culture   Final    NO GROWTH 5 DAYS Performed at [facility], [place] [address] [place], [place] [address]    Report Status 01/21/2018 FINAL  Final  Respiratory Panel by PCR     Status: Abnormal   Collection Time: 01/16/18  8:55 PM  Result Value Ref Range Status   Adenovirus NOT DETECTED NOT DETECTED Final   Coronavirus 229E NOT DETECTED NOT DETECTED Final   Coronavirus HKU1 NOT DETECTED NOT DETECTED Final   Coronavirus NL63 NOT DETECTED NOT DETECTED Final   Coronavirus OC43 NOT DETECTED NOT DETECTED Final   Metapneumovirus NOT DETECTED NOT DETECTED Final   Rhinovirus / Enterovirus DETECTED (A) NOT DETECTED Final   Influenza A NOT DETECTED NOT DETECTED Final   Influenza B NOT DETECTED NOT DETECTED Final   Parainfluenza Virus 1 NOT DETECTED NOT DETECTED Final   Parainfluenza Virus 2 NOT DETECTED NOT DETECTED Final   Parainfluenza Virus 3 NOT DETECTED NOT DETECTED Final   Parainfluenza Virus 4 NOT DETECTED NOT DETECTED Final   Respiratory Syncytial Virus NOT DETECTED NOT DETECTED Final   Bordetella pertussis NOT DETECTED NOT DETECTED Final   Chlamydophila pneumoniae NOT DETECTED NOT DETECTED Final   Mycoplasma pneumoniae NOT DETECTED NOT DETECTED Final  MRSA PCR Screening     Status: None   Collection Time: 01/17/18  3:33 AM  Result Value Ref Range Status   MRSA by PCR NEGATIVE NEGATIVE Final    Comment:        The GeneXpert MRSA Assay (FDA approved for NASAL specimens only), is one component of a comprehensive MRSA colonization surveillance program. It is not intended to diagnose MRSA infection nor to guide or monitor treatment for MRSA  infections. Performed at [facility], [place] [address], [place], [place] [address]   Culture, sputum-assessment     Status: None   Collection Time: 01/17/18  3:56 AM  Result Value Ref Range Status   Specimen Description SPUTUM  Final   Special Requests    Final    NONE Performed at [facility], [place] [address], [place], [place] [address]    Sputum evaluation   Final    Sputum specimen not acceptable for testing.  Please recollect.   RESULT CALLED TO, READ BACK BY AND VERIFIED WITH: RN Boyd Kerbs [phone] FCP    Report Status 01/17/2018 FINAL  Final  Expectorated sputum assessment w rflx to resp cult     Status: None   Collection Time: 01/17/18 12:49 PM  Result Value Ref Range Status   Specimen Description SPUTUM  Final   Special Requests Normal  Final   Sputum evaluation   Final    RARE WBC PRESENT, PREDOMINANTLY PMN RARE SQUAMOUS EPITHELIAL CELLS PRESENT FEW GRAM POSITIVE COCCI FEW GRAM NEGATIVE RODS THIS SPECIMEN IS ACCEPTABLE FOR SPUTUM CULTURE Performed at [facility], [place] [address], [place], [place] [address]    Report Status 01/17/2018 FINAL  Final  Culture, respiratory     Status: None   Collection Time: 01/17/18 12:49 PM  Result Value Ref Range Status   Specimen Description SPUTUM  Final   Special Requests Normal Reflexed from [id number]  Final   Gram Stain   Final    RARE WBC PRESENT, PREDOMINANTLY PMN NO SQUAMOUS EPITHELIAL CELLS SEEN FEW GRAM POSITIVE COCCI RARE GRAM NEGATIVE RODS RARE GRAM POSITIVE RODS    Culture   Final    FEW Consistent with normal respiratory flora. Performed at [facility], [place] [address] [place], [place] [address]    Report Status 01/20/2018 FINAL  Final    Radiology Studies: No results found.  Scheduled Meds: . arformoterol  15 mcg Nebulization BID  . aspirin EC  81 mg Oral Daily  . atorvastatin  40 mg Oral Daily  . budesonide (PULMICORT) nebulizer solution  0.25 mg Nebulization BID  . calcitRIOL  2.5 mcg Oral Q T,Th,Sa-HD  . Chlorhexidine Gluconate Cloth  6 each Topical Q0600  . Chlorhexidine Gluconate Cloth  6 each Topical Q0600  . cinacalcet  90 mg Oral Q breakfast  . darbepoetin (ARANESP) injection - DIALYSIS  60 mcg Intravenous Q Thu-HD  .  diphenhydrAMINE  50 mg Oral Q T,Th,Sa-HD  . guaiFENesin  1,200 mg Oral BID  . heparin  5,000 Units Subcutaneous Q8H  . insulin aspart  0-5 Units Subcutaneous QHS  . insulin aspart  0-9 Units Subcutaneous TID WC  . insulin glargine  6 Units Subcutaneous QHS  . ipratropium-albuterol  3 mL Nebulization QID  . midodrine  10 mg Oral TID  . multivitamin with minerals  1 tablet Oral Daily  . pentafluoroprop-tetrafluoroeth  1 application Topical Q T,Th,Sa-HD  . sevelamer carbonate  1,600 mg Oral TID WC   Continuous Infusions:   LOS: 4 days   Kerney Elbe, DO [facility] PAGER is on AMION  If 7PM-7AM, please contact night-coverage [email or web address] Password [email or web address] 01/22/2018, 5:11 PM

## 2018-01-22 NOTE — Care Management Important Message (Signed)
Important Message  Patient Details  Name: [name] MRN: [id number] Date of Birth: 1945/04/10   Medicare Important Message Given:  Yes    [name] [place] 01/22/2018, 4:54 PM

## 2018-01-23 ENCOUNTER — Inpatient Hospital Stay (HOSPITAL_COMMUNITY): Payer: Medicare HMO

## 2018-01-23 LAB — CBC WITH DIFFERENTIAL/PLATELET
Abs Immature Granulocytes: 0.63 10*3/uL — ABNORMAL HIGH (ref 0.00–0.07)
Basophils Absolute: 0 10*3/uL (ref 0.0–0.1)
Basophils Relative: 0 %
Eosinophils Absolute: 0.1 10*3/uL (ref 0.0–0.5)
Eosinophils Relative: 1 %
HCT: 35.1 % — ABNORMAL LOW (ref 36.0–46.0)
Hemoglobin: 11.2 g/dL — ABNORMAL LOW (ref 12.0–15.0)
Immature Granulocytes: 4 %
Lymphocytes Relative: 32 %
Lymphs Abs: 5.2 10*3/uL — ABNORMAL HIGH (ref 0.7–4.0)
MCH: 28.5 pg (ref 26.0–34.0)
MCHC: 31.9 g/dL (ref 30.0–36.0)
MCV: 89.3 fL (ref 80.0–100.0)
MONOS PCT: 11 %
Monocytes Absolute: 1.7 10*3/uL — ABNORMAL HIGH (ref 0.1–1.0)
NEUTROS ABS: 8.5 10*3/uL — AB (ref 1.7–7.7)
Neutrophils Relative %: 52 %
Platelets: 236 10*3/uL (ref 150–400)
RBC: 3.93 MIL/uL (ref 3.87–5.11)
RDW: 16.3 % — AB (ref 11.5–15.5)
WBC: 16.1 10*3/uL — ABNORMAL HIGH (ref 4.0–10.5)
nRBC: 1.7 % — ABNORMAL HIGH (ref 0.0–0.2)

## 2018-01-23 LAB — COMPREHENSIVE METABOLIC PANEL
ALT: 15 U/L (ref 0–44)
ANION GAP: 13 (ref 5–15)
AST: 12 U/L — ABNORMAL LOW (ref 15–41)
Albumin: 2.9 g/dL — ABNORMAL LOW (ref 3.5–5.0)
Alkaline Phosphatase: 61 U/L (ref 38–126)
BUN: 85 mg/dL — ABNORMAL HIGH (ref 8–23)
CO2: 28 mmol/L (ref 22–32)
Calcium: 8.6 mg/dL — ABNORMAL LOW (ref 8.9–10.3)
Chloride: 94 mmol/L — ABNORMAL LOW (ref 98–111)
Creatinine, Ser: 8.63 mg/dL — ABNORMAL HIGH (ref 0.44–1.00)
GFR, EST AFRICAN AMERICAN: 5 mL/min — AB (ref >60)
GFR, EST NON AFRICAN AMERICAN: 4 mL/min — AB (ref >60)
Glucose, Bld: 252 mg/dL — ABNORMAL HIGH (ref 70–99)
POTASSIUM: 4.3 mmol/L (ref 3.5–5.1)
Sodium: 135 mmol/L (ref 135–145)
TOTAL PROTEIN: 6.9 g/dL (ref 6.5–8.1)
Total Bilirubin: 1.4 mg/dL — ABNORMAL HIGH (ref 0.3–1.2)

## 2018-01-23 LAB — TROPONIN I: Troponin I: <0.03 ng/mL (ref <0.03)

## 2018-01-23 LAB — GLUCOSE, CAPILLARY
Glucose-Capillary: 194 mg/dL — ABNORMAL HIGH (ref 70–99)
Glucose-Capillary: 134 mg/dL — ABNORMAL HIGH (ref 70–99)
Glucose-Capillary: 168 mg/dL — ABNORMAL HIGH (ref 70–99)
Glucose-Capillary: 214 mg/dL — ABNORMAL HIGH (ref 70–99)

## 2018-01-23 LAB — MAGNESIUM: MAGNESIUM: 2.5 mg/dL — AB (ref 1.7–2.4)

## 2018-01-23 LAB — PHOSPHORUS: Phosphorus: 3.6 mg/dL (ref 2.5–4.6)

## 2018-01-23 MED ORDER — CALCITRIOL 0.25 MCG PO CAPS
ORAL_CAPSULE | ORAL | Status: AC
  Filled 2018-01-23: qty 1

## 2018-01-23 MED ORDER — CALCITRIOL 0.25 MCG PO CAPS
ORAL_CAPSULE | ORAL | Status: AC
  Administered 2018-01-23: 2.5 ug via ORAL
  Filled 2018-01-23: qty 1

## 2018-01-23 MED ORDER — DIPHENHYDRAMINE HCL 25 MG PO CAPS
ORAL_CAPSULE | ORAL | Status: AC
  Administered 2018-01-23: 50 mg via ORAL
  Filled 2018-01-23: qty 2

## 2018-01-23 MED ORDER — CALCITRIOL 0.25 MCG PO CAPS
ORAL_CAPSULE | ORAL | Status: AC
  Filled 2018-01-23: qty 2

## 2018-01-23 MED ORDER — HEPARIN SODIUM (PORCINE) 1000 UNIT/ML IJ SOLN
INTRAMUSCULAR | Status: AC
  Administered 2018-01-23: 10000 [IU] via INTRAVENOUS_CENTRAL
  Filled 2018-01-23: qty 10

## 2018-01-23 MED ORDER — SODIUM CHLORIDE 0.9 % IV BOLUS
250.0000 mL | Freq: Once | INTRAVENOUS | Status: AC
  Administered 2018-01-23: 250 mL via INTRAVENOUS

## 2018-01-23 MED ORDER — MIDODRINE HCL 5 MG PO TABS
ORAL_TABLET | ORAL | Status: AC
  Administered 2018-01-23: 10 mg via ORAL
  Filled 2018-01-23: qty 2

## 2018-01-23 MED ORDER — HEPARIN SODIUM (PORCINE) 1000 UNIT/ML DIALYSIS
10000.0000 [IU] | Freq: Once | INTRAMUSCULAR | Status: AC
  Administered 2018-01-23: 10000 [IU] via INTRAVENOUS_CENTRAL

## 2018-01-23 MED ORDER — CALCITRIOL 0.5 MCG PO CAPS
ORAL_CAPSULE | ORAL | Status: AC
  Filled 2018-01-23: qty 3

## 2018-01-23 NOTE — Progress Notes (Signed)
Patient states she will get RN to call when she is ready to be placed on CPAP.

## 2018-01-23 NOTE — Procedures (Signed)
I was present at this dialysis session. I have reviewed the session itself and made appropriate changes.   Doing well via LUE AVF. 2K bath.  Assess EDW post HD today prior to DC.   Filed Weights   01/21/18 1102 01/22/18 0501 01/23/18 0450  Weight: 124.9 kg 126.6 kg 127.1 kg    Recent Labs  Lab 01/23/18 0410  NA 135  K 4.3  CL 94*  CO2 28  GLUCOSE 252*  BUN 85*  CREATININE 8.63*  CALCIUM 8.6*  PHOS 3.6    Recent Labs  Lab 01/20/18 0403 01/21/18 0329 01/23/18 0410  WBC 11.8* 12.3* 16.1*  NEUTROABS 9.7* 9.6* 8.5*  HGB 10.7* 10.9* 11.2*  HCT 33.4* 34.6* 35.1*  MCV 91.8 91.1 89.3  PLT 249 241 236    Scheduled Meds: . arformoterol  15 mcg Nebulization BID  . aspirin EC  81 mg Oral Daily  . atorvastatin  40 mg Oral Daily  . budesonide (PULMICORT) nebulizer solution  0.25 mg Nebulization BID  . calcitRIOL  2.5 mcg Oral Q T,Th,Sa-HD  . Chlorhexidine Gluconate Cloth  6 each Topical Q0600  . Chlorhexidine Gluconate Cloth  6 each Topical Q0600  . cinacalcet  90 mg Oral Q breakfast  . darbepoetin (ARANESP) injection - DIALYSIS  60 mcg Intravenous Q Thu-HD  . diphenhydrAMINE  50 mg Oral Q T,Th,Sa-HD  . guaiFENesin  1,200 mg Oral BID  . heparin      . [START ON 01/24/2018] heparin  10,000 Units Dialysis Once in dialysis  . heparin  5,000 Units Subcutaneous Q8H  . insulin aspart  0-5 Units Subcutaneous QHS  . insulin aspart  0-9 Units Subcutaneous TID WC  . insulin glargine  6 Units Subcutaneous QHS  . ipratropium-albuterol  3 mL Nebulization QID  . midodrine  10 mg Oral TID  . multivitamin with minerals  1 tablet Oral Daily  . pentafluoroprop-tetrafluoroeth  1 application Topical Q T,Th,Sa-HD  . sevelamer carbonate  1,600 mg Oral TID WC   Continuous Infusions: PRN Meds:.acetaminophen, albuterol, benzonatate, hydrALAZINE, ondansetron (ZOFRAN) IV, oxyCODONE-acetaminophen, zolpidem   Pearson Grippe  MD 01/23/2018, 8:26 AM

## 2018-01-23 NOTE — Progress Notes (Signed)
PROGRESS NOTE    [name]  MOQ:[id number] DOB: 03-15-1945 DOA: 01/16/2018 PCP: [name], MD   Brief Narrative:   [name] is a 73 y.o. year old female with medical history significant for ESRD on HD TThSat, HTN, type 2 diabetes, diastolic CHF, CAD, OSA on CPAP, former smoker, HLD and other comorbids who presented on 01/16/2018 with 2 to 3 days of worsening nonproductive cough and shortness of breath with no improvement with home inhaler regimen and was found to have acute bronchitis/COPD exacerbation secondary to rhinovirus infection. She continued to wheeze significantly but feels as if her breathing is slowly improving.  That her breathing is about the same and had pulmonary evaluation they made recommendations which we have implemented.  Dr. Loanne Drilling feels patient's dyspnea is more related to her emphysema and recommends completing a 5-day course of steroids along with 5 days of azithromycin which is today.  She recommends while inpatient continue DuoNeb 3 times daily and Pulmicort twice daily as well as Brovana twice daily. Has been feeling more fatigued and weak and unable to participate with PT due hypotension. Will continue to Monitor and will check Blood Cx's due to hypotension and weakness as well as slightly worsening WBC.   Assessment & Plan:   Principal Problem:   COPD exacerbation ([place]) Active Problems:   OSA on CPAP   Coronary artery disease   ESRD needing dialysis ([place])   Type II diabetes mellitus with renal manifestations (HCC)   Chest pain   Chronic diastolic CHF (congestive heart failure) (HCC)   Hypotension   Acute bronchitis   Essential hypertension   HLD (hyperlipidemia)   Acute bronchitis due to Rhinovirus   Respiratory distress, acute  Acute Bronchitis/COPD flare secondary to Rhinovirus, improving   -Still has some dyspnea, diffuse coarse wheezing despite recent breathing treatments.  Reevaluated and feel that this dyspnea is more related to her  baseline emphysema and recommending completing a 5-day course of steroids with no taper as well as azithromycin  -Needs to stay for inpatient course due to significant respiratory distress from the virus exacerbating her COPD. -IV steroids to be discontinued today as they were started on 01/08/2018 -PRN albuterol nebs and will place the patient on guaifenesin 1200 mg p.o. twice daily, flutter valve, incentive spirometer -DuoNeb 3 mL 3 times daily -Solu-Medrol was increased to 60 mg every 8 but will be discontinued today with no taper at the recommendation of pulmonary -Discontinued Incruse Ellipta and pulmonary has adjusted medications and they have started the patient on Brovana twice daily -Sputum Cx showed Normal Respiratory Flora -Continue with Pulmicort twice daily -C/w Benzonatate 200 mg po TID -Will C/w Azithromycin but increase dose to 500 mg po until yesterday as she completes 5 days -Since she did not significantly improve and is still dyspenic with "shallow breathing" will place on Budesonide -Will also consult Pulmonary for further evaluation and Recommendations -Repeat CXR done yesterday showed "Stable cardiomegaly. Atherosclerosis of thoracic aorta is noted. No pneumothorax or pleural effusion is noted. No acute pulmonary disease is noted. Bony thorax is unremarkable." -Home Ambulatory Screen done but she did not desaturate but was visibly SOB and likely from Deconditioning -Will obtain PT/OT to Evaluate and treat but unable to do it due to Fatigue and Hypotension  Generalized Weakness -From Above and other Multiple Medical Comorbidities  -PT and OT to evaluate and treat but unable to do so because of Hypotension  -Check Blood Cultures and Troponins -Continue to Monitor extremely closely  ESRD on HD -Adherent with regimen.  On Tuesday Thursday Saturday schedule, nephrology following and appreciate further evaluation and recommendations  -Did not go to Dialysis today; BUN/Cr is  now 85/8.63  Secondary Hyperparathyroidism. -Phos was 3.6 -Continue home Renvela and Cinacalcet  Bone mineral disease related to ESRD.   -Continue home Calcitriol 2.5 mcg TThSat with HD.  Chronic Anemia and Anemia of CKD.   -Continue Aranesp 60 qweek with dialysis.   -Hb/Hct stable at 11.2/35.1 -Per Nephro if Hgb drops can give a short course of IV Fe  -Continuet to Monitor CBC and repeat in AM   Hypertension -> Hypotension -Prone to low BP at dialysis.  Continue home Midodrine. -Continue to Hold Home Carvedilol  -BP is now 82/42 and patient has been clammy, nauseous and not feeling good -? If she had too much taken off in Dialysis -Check Troponin I x3 -Give 250 NS bolus now over 1 hour -Continue to Monitor Closely  Hyperlipidemia -Stable.   -Continue Atorvastatin 40 mg po Daily  Type 2 Diabetes  -A1c 5.7 (12/19).   -Monitor CBGs. CBGs ranging from 147-325 -diabetes education coronary consulted and recommending place the patient on Lantus 6 units nightly as well as place the patient on sensitive NovoLog sliding scale before meals and at bedtime -We will continue monitor CBGs carefully and will need to monitor closely now that we will be discontinuing steroids  Morbid Obesity  -Estimated body mass index is 44.3 kg/m as calculated from the following:   Height as of this encounter: 5\' 6"  (1.676 m).   Weight as of this encounter: 124.5 kg. -Weight Loss Counseling given  Hyponatremia -Was Mild at 134 and improved 135 -Expect to correct with Dialysis -Continue to Monitor and Repeat CMP in AM  Leukocytosis -Patient's WBC went from 11.8 is now 16.1 -Likely reactive in the setting of steroid demargination but stopped Steroids a few days ago -Check Blood Cultures -CXR today showed "Cardiomegaly and aortic atherosclerosis. Question pulmonary venous hypertension without frank edema. Mild basilar atelectasis.  -Continue to monitor for signs and symptoms infection -Repeat  CBC in the a.m.   DVT prophylaxis: Heparin 5,000 sq q8h Code Status: FULL CODE Family Communication: No family present at bedside Disposition Plan: Was to D/C Home today but does not have a ride to Dialysis tomorrow. Appeared Deconditioned so obtaining PT evaluation   Consultants:   Nephrology    Procedures:  Dialysis    Antimicrobials:  Anti-infectives (From admission, onward)   Start     Dose/Rate Route Frequency Ordered Stop   01/20/18 1000  azithromycin (ZITHROMAX) tablet 500 mg     500 mg Oral Daily 01/20/18 0934 01/21/18 1159   01/18/18 1000  azithromycin (ZITHROMAX) tablet 250 mg  Status:  Discontinued     250 mg Oral Daily 01/16/18 2048 01/20/18 0934   01/17/18 1000  azithromycin (ZITHROMAX) tablet 500 mg     500 mg Oral Daily 01/16/18 2048 01/17/18 0811   01/16/18 2000  vancomycin (VANCOCIN) 2,000 mg in sodium chloride 0.9 % 500 mL IVPB     2,000 mg 250 mL/hr over 120 Minutes Intravenous  Once 01/16/18 1953 01/16/18 2315   01/16/18 1945  ceFEPIme (MAXIPIME) 2 g in sodium chloride 0.9 % 100 mL IVPB     2 g 200 mL/hr over 30 Minutes Intravenous  Once 01/16/18 1933 01/16/18 2056     Subjective: Seen and examined morning dialysis ands he felt very fatigued.  Still feeling dyspneic but her main complaint was  her weakness.  No chest pain, lightheadedness or dizziness.  No nausea or vomiting.  Denies any leg swelling.  No other concerns complaints at this time.  Objective: Vitals:   01/23/18 1200 01/23/18 1210 01/23/18 1313 01/23/18 1557  BP: (!) 107/31 (!) 105/49 (!) 82/42   Pulse: 76 72 86   Resp:  16 17   Temp:  97.8 F (36.6 C)    TempSrc:  Oral    SpO2:  96% 94% 95%  Weight:  124.5 kg    Height:        Intake/Output Summary (Last 24 hours) at 01/23/2018 1639 Last data filed at 01/23/2018 1210 Gross per 24 hour  Intake 480 ml  Output 2000 ml  Net -1520 ml   Filed Weights   01/22/18 0501 01/23/18 0450 01/23/18 1210  Weight: 126.6 kg 127.1 kg 124.5 kg    Examination: Physical Exam:  Constitutional: Well-nourished, well-developed obese African-American female currently in dialysis and appears more fatigued.  Still appears somewhat dyspneic and a little hoarse on conversation Eyes: Conjunctive are normal.  Sclera anicteric ENMT: External ears and nose appear normal.  Mucous members are moist Neck: Appears supple no JVD Respiratory: Diminished to auscultation bilaterally with some still coarse breath sounds as well as mild expiratory wheezing.  Still is dyspneic during conversation but does not desaturate.  Currently not wearing any supplemental oxygen via nasal cannula and is not hypoxic Cardiovascular: Regular rate and rhythm.  No appreciable murmurs, rubs, gallops.  Has lower extremity edema which is nonpitting Abdomen: Soft, nontender, distended secondary body habitus.  Bowel sounds present GU: Deferred Musculoskeletal: No contractures or cyanosis.  No joint deformities noted Skin: Skin is warm and dry no appreciable rashes or lesions on limited skin evaluation.  Is getting dialysis through her left arm upper fistula Neurologic: Cranial nerves II through XII grossly intact with no appreciable focal deficits Psychiatric: Depressed mood and flat affect.  Normal judgment insight.  She is awake alert and oriented  Data Reviewed: I have personally reviewed following labs and imaging studies  CBC: Recent Labs  Lab 01/16/18 1822  01/18/18 0402 01/19/18 0742 01/20/18 0403 01/21/18 0329 01/23/18 0410  WBC 10.1  --  15.0* 14.1* 11.8* 12.3* 16.1*  NEUTROABS 5.8  --   --   --  9.7* 9.6* 8.5*  HGB 10.9*   < > 10.3* 10.1* 10.7* 10.9* 11.2*  HCT 35.8*   < > 32.9* 31.6* 33.4* 34.6* 35.1*  MCV 94.2  --  91.9 92.4 91.8 91.1 89.3  PLT 183  --  222 251 249 241 236   < > = values in this interval not displayed.   Basic Metabolic Panel: Recent Labs  Lab 01/19/18 0742 01/20/18 0403 01/21/18 0329 01/22/18 0848 01/23/18 0410  NA 134* 134* 133*  134* 135  K 4.7 5.1 4.6 4.1 4.3  CL 97* 95* 92* 94* 94*  CO2 22 29 27 25 28   GLUCOSE 327* 351* 553* 336* 252*  BUN 102* 53* 48* 57* 85*  CREATININE 12.22* 7.25* 5.71* 6.19* 8.63*  CALCIUM 8.3* 9.0 9.2 9.1 8.6*  MG  --  2.4 2.4 2.3 2.5*  PHOS 3.5 4.3 4.0 4.7* 3.6   GFR: Estimated Creatinine Clearance: 7.8 mL/min (A) (by C-G formula based on SCr of 8.63 mg/dL (H)). Liver Function Tests: Recent Labs  Lab 01/19/18 0742 01/20/18 0403 01/21/18 0329 01/22/18 0848 01/23/18 0410  AST  --  20 18 18  12*  ALT  --  14 15 16  15  ALKPHOS  --  66 65 66 61  BILITOT  --  0.7 0.9 1.0 1.4*  PROT  --  7.4 7.6 7.7 6.9  ALBUMIN 2.8* 3.0* 2.9* 3.1* 2.9*   No results for input(s): LIPASE, AMYLASE in the last 168 hours. No results for input(s): AMMONIA in the last 168 hours. Coagulation Profile: Recent Labs  Lab 01/16/18 1822  INR 1.13   Cardiac Enzymes: No results for input(s): CKTOTAL, CKMB, CKMBINDEX, TROPONINI in the last 168 hours. BNP (last 3 results) No results for input(s): PROBNP in the last 8760 hours. HbA1C: No results for input(s): HGBA1C in the last 72 hours. CBG: Recent Labs  Lab 01/22/18 1153 01/22/18 1622 01/22/18 2046 01/23/18 0631 01/23/18 1335  GLUCAP 230* 147* 296* 194* 134*   Lipid Profile: No results for input(s): CHOL, HDL, LDLCALC, TRIG, CHOLHDL, LDLDIRECT in the last 72 hours. Thyroid Function Tests: No results for input(s): TSH, T4TOTAL, FREET4, T3FREE, THYROIDAB in the last 72 hours. Anemia Panel: No results for input(s): VITAMINB12, FOLATE, FERRITIN, TIBC, IRON, RETICCTPCT in the last 72 hours. Sepsis Labs: Recent Labs  Lab 01/16/18 1847 01/16/18 2012  LATICACIDVEN 1.26 1.47    Recent Results (from the past 240 hour(s))  Blood culture (routine x 2)     Status: None   Collection Time: 01/16/18  7:38 PM  Result Value Ref Range Status   Specimen Description BLOOD BLOOD RIGHT WRIST  Final   Special Requests   Final    BOTTLES DRAWN AEROBIC AND  ANAEROBIC Blood Culture adequate volume   Culture   Final    NO GROWTH 5 DAYS Performed at [facility], [address] [place], [place] [address]    Report Status 01/21/2018 FINAL  Final  Blood culture (routine x 2)     Status: None   Collection Time: 01/16/18  7:42 PM  Result Value Ref Range Status   Specimen Description BLOOD BLOOD RIGHT HAND  Final   Special Requests   Final    BOTTLES DRAWN AEROBIC AND ANAEROBIC Blood Culture adequate volume   Culture   Final    NO GROWTH 5 DAYS Performed at [facility], [place] [address], [place], [place] [address]    Report Status 01/21/2018 FINAL  Final  Respiratory Panel by PCR     Status: Abnormal   Collection Time: 01/16/18  8:55 PM  Result Value Ref Range Status   Adenovirus NOT DETECTED NOT DETECTED Final   Coronavirus 229E NOT DETECTED NOT DETECTED Final   Coronavirus HKU1 NOT DETECTED NOT DETECTED Final   Coronavirus NL63 NOT DETECTED NOT DETECTED Final   Coronavirus OC43 NOT DETECTED NOT DETECTED Final   Metapneumovirus NOT DETECTED NOT DETECTED Final   Rhinovirus / Enterovirus DETECTED (A) NOT DETECTED Final   Influenza A NOT DETECTED NOT DETECTED Final   Influenza B NOT DETECTED NOT DETECTED Final   Parainfluenza Virus 1 NOT DETECTED NOT DETECTED Final   Parainfluenza Virus 2 NOT DETECTED NOT DETECTED Final   Parainfluenza Virus 3 NOT DETECTED NOT DETECTED Final   Parainfluenza Virus 4 NOT DETECTED NOT DETECTED Final   Respiratory Syncytial Virus NOT DETECTED NOT DETECTED Final   Bordetella pertussis NOT DETECTED NOT DETECTED Final   Chlamydophila pneumoniae NOT DETECTED NOT DETECTED Final   Mycoplasma pneumoniae NOT DETECTED NOT DETECTED Final  MRSA PCR Screening     Status: None   Collection Time: 01/17/18  3:33 AM  Result Value Ref Range Status   MRSA  by PCR NEGATIVE NEGATIVE Final    Comment:        The GeneXpert MRSA Assay (FDA approved for NASAL specimens only), is one component of a comprehensive MRSA  colonization surveillance program. It is not intended to diagnose MRSA infection nor to guide or monitor treatment for MRSA infections. Performed at [facility], [place] [address] [place], [place] [address]   Culture, sputum-assessment     Status: None   Collection Time: 01/17/18  3:56 AM  Result Value Ref Range Status   Specimen Description SPUTUM  Final   Special Requests   Final    NONE Performed at [facility], [place] [address] [place], [place] [address]    Sputum evaluation   Final    Sputum specimen not acceptable for testing.  Please recollect.   RESULT CALLED TO, READ BACK BY AND VERIFIED WITH: RN Boyd Kerbs [phone] FCP    Report Status 01/17/2018 FINAL  Final  Expectorated sputum assessment w rflx to resp cult     Status: None   Collection Time: 01/17/18 12:49 PM  Result Value Ref Range Status   Specimen Description SPUTUM  Final   Special Requests Normal  Final   Sputum evaluation   Final    RARE WBC PRESENT, PREDOMINANTLY PMN RARE SQUAMOUS EPITHELIAL CELLS PRESENT FEW GRAM POSITIVE COCCI FEW GRAM NEGATIVE RODS THIS SPECIMEN IS ACCEPTABLE FOR SPUTUM CULTURE Performed at [facility], [place] [address], [place], [place] [address]    Report Status 01/17/2018 FINAL  Final  Culture, respiratory     Status: None   Collection Time: 01/17/18 12:49 PM  Result Value Ref Range Status   Specimen Description SPUTUM  Final   Special Requests Normal Reflexed from [id number]  Final   Gram Stain   Final    RARE WBC PRESENT, PREDOMINANTLY PMN NO SQUAMOUS EPITHELIAL CELLS SEEN FEW GRAM POSITIVE COCCI RARE GRAM NEGATIVE RODS RARE GRAM POSITIVE RODS    Culture   Final    FEW Consistent with normal respiratory flora. Performed at [facility], [place] [address] [place], [place] [address]    Report Status 01/20/2018 FINAL  Final    Radiology Studies: Dg Chest Port 1 View  Result Date: 01/23/2018 CLINICAL DATA:  Shortness of breath. EXAM: PORTABLE CHEST 1  VIEW COMPARISON:  01/20/2018 FINDINGS: Chronic cardiomegaly and aortic atherosclerosis. Question pulmonary venous hypertension without frank edema. Mild bibasilar atelectasis. IMPRESSION: Cardiomegaly and aortic atherosclerosis. Question pulmonary venous hypertension without frank edema. Mild basilar atelectasis. Electronically Signed   By: Nelson Chimes M.D.   On: 01/23/2018 08:14    Scheduled Meds: . arformoterol  15 mcg Nebulization BID  . aspirin EC  81 mg Oral Daily  . atorvastatin  40 mg Oral Daily  . budesonide (PULMICORT) nebulizer solution  0.25 mg Nebulization BID  . calcitRIOL      . calcitRIOL      . calcitRIOL      . calcitRIOL  2.5 mcg Oral Q T,Th,Sa-HD  . Chlorhexidine Gluconate Cloth  6 each Topical Q0600  . Chlorhexidine Gluconate Cloth  6 each Topical Q0600  . cinacalcet  90 mg Oral Q breakfast  . darbepoetin (ARANESP) injection - DIALYSIS  60 mcg Intravenous Q Thu-HD  . diphenhydrAMINE  50 mg Oral Q T,Th,Sa-HD  . guaiFENesin  1,200 mg Oral BID  . heparin  5,000 Units Subcutaneous Q8H  . insulin aspart  0-5 Units Subcutaneous QHS  . insulin aspart  0-9 Units Subcutaneous TID WC  . insulin glargine  6 Units Subcutaneous QHS  . ipratropium-albuterol  3 mL Nebulization QID  . midodrine  10 mg Oral TID  . multivitamin with minerals  1 tablet Oral Daily  . pentafluoroprop-tetrafluoroeth  1 application Topical Q T,Th,Sa-HD  . sevelamer carbonate  1,600 mg Oral TID WC   Continuous Infusions:   LOS: 5 days   Kerney Elbe, DO [facility] PAGER is on AMION  If 7PM-7AM, please contact night-coverage [email or web address] Password [facility] 01/23/2018, 4:39 PM

## 2018-01-23 NOTE — Progress Notes (Signed)
PT Cancellation Note  Patient Details Name: [name] MRN: [id number] DOB: July 19, 1945   Cancelled Treatment:    Reason Eval/Treat Not Completed: Medical issues which prohibited therapy;Fatigue/lethargy limiting ability to participate(Pt feeling poorly after HD.  Clammy skin. BP low.)BP 74/44.  Notified nursing.  Will return tomorrow.     Denice Paradise 01/23/2018, 2:12 PM  Cliford Sequeira,PT Acute Rehabilitation Services Pager:  [phone]  Office:  [phone]

## 2018-01-23 NOTE — Progress Notes (Signed)
PT Cancellation Note  Patient Details Name: [name] MRN: [id number] DOB: 01/22/1945   Cancelled Treatment:    Reason Eval/Treat Not Completed: Patient at procedure or test/unavailable(Pt in HD.  Will check back as time allows.  )   Denice Paradise 01/23/2018, 8:17 AM  [place] Pager:  [phone]  Office:  [phone]

## 2018-01-24 LAB — CBC WITH DIFFERENTIAL/PLATELET
Abs Immature Granulocytes: 0.33 10*3/uL — ABNORMAL HIGH (ref 0.00–0.07)
BASOS PCT: 0 %
Basophils Absolute: 0 10*3/uL (ref 0.0–0.1)
Eosinophils Absolute: 0.2 10*3/uL (ref 0.0–0.5)
Eosinophils Relative: 2 %
HCT: 35.1 % — ABNORMAL LOW (ref 36.0–46.0)
Hemoglobin: 11.5 g/dL — ABNORMAL LOW (ref 12.0–15.0)
Immature Granulocytes: 2 %
Lymphocytes Relative: 34 %
Lymphs Abs: 4.5 10*3/uL — ABNORMAL HIGH (ref 0.7–4.0)
MCH: 29.9 pg (ref 26.0–34.0)
MCHC: 32.8 g/dL (ref 30.0–36.0)
MCV: 91.2 fL (ref 80.0–100.0)
MONOS PCT: 9 %
Monocytes Absolute: 1.3 10*3/uL — ABNORMAL HIGH (ref 0.1–1.0)
Neutro Abs: 7.1 10*3/uL (ref 1.7–7.7)
Neutrophils Relative %: 53 %
Platelets: 233 10*3/uL (ref 150–400)
RBC: 3.85 MIL/uL — ABNORMAL LOW (ref 3.87–5.11)
RDW: 16.9 % — ABNORMAL HIGH (ref 11.5–15.5)
WBC: 13.5 10*3/uL — ABNORMAL HIGH (ref 4.0–10.5)
nRBC: 1.6 % — ABNORMAL HIGH (ref 0.0–0.2)

## 2018-01-24 LAB — COMPREHENSIVE METABOLIC PANEL
ALT: 15 U/L (ref 0–44)
AST: 16 U/L (ref 15–41)
Albumin: 2.8 g/dL — ABNORMAL LOW (ref 3.5–5.0)
Alkaline Phosphatase: 62 U/L (ref 38–126)
Anion gap: 16 — ABNORMAL HIGH (ref 5–15)
BUN: 54 mg/dL — ABNORMAL HIGH (ref 8–23)
CO2: 26 mmol/L (ref 22–32)
Calcium: 8.4 mg/dL — ABNORMAL LOW (ref 8.9–10.3)
Chloride: 93 mmol/L — ABNORMAL LOW (ref 98–111)
Creatinine, Ser: 6.55 mg/dL — ABNORMAL HIGH (ref 0.44–1.00)
GFR calc Af Amer: 7 mL/min — ABNORMAL LOW (ref >60)
GFR calc non Af Amer: 6 mL/min — ABNORMAL LOW (ref >60)
Glucose, Bld: 162 mg/dL — ABNORMAL HIGH (ref 70–99)
POTASSIUM: 4.1 mmol/L (ref 3.5–5.1)
Sodium: 135 mmol/L (ref 135–145)
Total Bilirubin: 1.6 mg/dL — ABNORMAL HIGH (ref 0.3–1.2)
Total Protein: 6.5 g/dL (ref 6.5–8.1)

## 2018-01-24 LAB — GLUCOSE, CAPILLARY
Glucose-Capillary: 141 mg/dL — ABNORMAL HIGH (ref 70–99)
Glucose-Capillary: 145 mg/dL — ABNORMAL HIGH (ref 70–99)

## 2018-01-24 LAB — MAGNESIUM: Magnesium: 2.3 mg/dL (ref 1.7–2.4)

## 2018-01-24 LAB — TROPONIN I: Troponin I: <0.03 ng/mL (ref <0.03)

## 2018-01-24 LAB — PHOSPHORUS: Phosphorus: 4.5 mg/dL (ref 2.5–4.6)

## 2018-01-24 MED ORDER — SODIUM CHLORIDE 0.9 % IV BOLUS: 250.0000 mL | Freq: Once | INTRAVENOUS | Status: DC

## 2018-01-24 MED ORDER — ACETAMINOPHEN 325 MG PO TABS: 650.0000 mg | ORAL_TABLET | Freq: Four times a day (QID) | ORAL | 0 refills | Status: DC | PRN

## 2018-01-24 MED ORDER — GUAIFENESIN ER 600 MG PO TB12: 600.0000 mg | ORAL_TABLET | Freq: Two times a day (BID) | ORAL | 0 refills | Status: DC

## 2018-01-24 MED ORDER — BUDESONIDE-FORMOTEROL FUMARATE 160-4.5 MCG/ACT IN AERO: 2.0000 | INHALATION_SPRAY | Freq: Two times a day (BID) | RESPIRATORY_TRACT | 12 refills | Status: DC

## 2018-01-24 MED ORDER — BENZONATATE 200 MG PO CAPS: 200.0000 mg | ORAL_CAPSULE | Freq: Three times a day (TID) | ORAL | 0 refills | Status: DC | PRN

## 2018-01-24 MED ORDER — TIOTROPIUM BROMIDE MONOHYDRATE 2.5 MCG/ACT IN AERS: 2.5000 ug | INHALATION_SPRAY | Freq: Two times a day (BID) | RESPIRATORY_TRACT | 0 refills | Status: DC

## 2018-01-24 NOTE — Progress Notes (Signed)
[facility] Progress Note   Subjective:  Weak and fatigued after dialysis yesterday, blood pressure is low for her.  Unable to participate with physical therapy, they will come back today Liters of ultrafiltration with hemodialysis yesterday. Blood pressures improved over this morning She feels much improved  Objective Vitals:   01/24/18 0532 01/24/18 0739 01/24/18 0825 01/24/18 1144  BP: (!) 87/54  (!) 98/54   Pulse: 71  74   Resp: 20  17   Temp: (!) 97.5 F (36.4 C)     TempSrc: Oral     SpO2: 98% 100% 91% 92%  Weight: 124.4 kg     Height:       Physical Exam General: Obese female NAD Heart: RRR Lungs: occasional crackles at bases Abdomen: soft NT Extremities: no LE edema  Dialysis Access: LUE AVF+ bruit   Dialysis Orders: [place] T,Th,S 4.5 hrs 180NRe 450/Autoflow 1.5 125.5 kg 2.0 K/ 2.0 Ca  LUA AVF -Heparin 13,000 units IV TIW -Mircera 60 mcg IV q 2 weeks (last dose 01/07/18 Last HGB 10.1 Tsat 14 Iron 28 01/14/18) -Calcitriol 2.5 mcg PO TIW (last PTH 511 Ca 8.9 C Ca 9.1 Phos 3.8 01/14/18)  Assessment/Plan: 1.Exacerbation of COPD/bronchitis - per primary+ rhinovirus/enterovirus/ steroids/breathing treatment/azithromycin/benzonatate - former smoker 2. ESRD- TTS. Continue on schedule. 3. Anemia- hgb >10. Stable.  Continue Aranesp 60 q week 4. Secondary hyperparathyroidism- binders/VDRA ordered Ca/P ok 5.HTN/volume  - prone to low BP on dialysis. CXR clear on 1/1. Got below EDW on 1/2 post wt 124.9kg.  Lower EDW at DC 6. Nutrition- changed to renal carb mod/vits 7. DM - per primary -  Additional Objective Labs: Basic Metabolic Panel: Recent Labs  Lab 01/22/18 0848 01/23/18 0410 01/24/18 0632  NA 134* 135 135  K 4.1 4.3 4.1  CL 94* 94* 93*  CO2 25 28 26   GLUCOSE 336* 252* 162*  BUN 57* 85* 54*  CREATININE 6.19* 8.63* 6.55*  CALCIUM 9.1 8.6* 8.4*  PHOS 4.7* 3.6 4.5   CBC: Recent Labs  Lab 01/19/18 0742  01/20/18 0403  01/21/18 0329 01/23/18 0410 01/24/18 0632  WBC 14.1*  --  11.8* 12.3* 16.1* 13.5*  NEUTROABS  --    < > 9.7* 9.6* 8.5* 7.1  HGB 10.1*  --  10.7* 10.9* 11.2* 11.5*  HCT 31.6*  --  33.4* 34.6* 35.1* 35.1*  MCV 92.4  --  91.8 91.1 89.3 91.2  PLT 251  --  249 241 236 233   < > = values in this interval not displayed.   Blood Culture    Component Value Date/Time   SDES BLOOD BLOOD RIGHT ARM 01/23/2018 1708   SPECREQUEST AEROBIC BOTTLE ONLY Blood Culture adequate volume 01/23/2018 1708   CULT NO GROWTH < 24 HOURS 01/23/2018 1708   REPTSTATUS PENDING 01/23/2018 1708    Cardiac Enzymes: Recent Labs  Lab 01/23/18 1703 01/23/18 2254 01/24/18 0632  TROPONINI <0.03 <0.03 <0.03   CBG: Recent Labs  Lab 01/23/18 1335 01/23/18 1650 01/23/18 2111 01/24/18 0729 01/24/18 1102  GLUCAP 134* 168* 214* 141* 145*   Iron Studies: No results for input(s): IRON, TIBC, TRANSFERRIN, FERRITIN in the last 72 hours. Lab Results  Component Value Date   INR 1.13 01/16/2018   INR 1.12 04/06/2017   INR 1.10 07/28/2015   Medications: . sodium chloride Stopped (01/24/18 0935)   . arformoterol  15 mcg Nebulization BID  . aspirin EC  81 mg Oral Daily  . atorvastatin  40 mg Oral Daily  .  budesonide (PULMICORT) nebulizer solution  0.25 mg Nebulization BID  . calcitRIOL  2.5 mcg Oral Q T,Th,Sa-HD  . Chlorhexidine Gluconate Cloth  6 each Topical Q0600  . Chlorhexidine Gluconate Cloth  6 each Topical Q0600  . cinacalcet  90 mg Oral Q breakfast  . darbepoetin (ARANESP) injection - DIALYSIS  60 mcg Intravenous Q Thu-HD  . diphenhydrAMINE  50 mg Oral Q T,Th,Sa-HD  . guaiFENesin  1,200 mg Oral BID  . heparin  5,000 Units Subcutaneous Q8H  . insulin aspart  0-5 Units Subcutaneous QHS  . insulin aspart  0-9 Units Subcutaneous TID WC  . insulin glargine  6 Units Subcutaneous QHS  . ipratropium-albuterol  3 mL Nebulization QID  . midodrine  10 mg Oral TID  . multivitamin with minerals  1 tablet Oral  Daily  . pentafluoroprop-tetrafluoroeth  1 application Topical Q T,Th,Sa-HD  . sevelamer carbonate  1,600 mg Oral TID WC

## 2018-01-24 NOTE — Care Management Note (Signed)
Case Management Note  Patient Details  Name: BLAINE GUIFFRE MRN: [id number] Date of Birth: 24-Apr-1945  Action/Plan: CM talked to patient about [facility] [place] choices, patient chose [place]; Brad with [facility] called for arrangements. No DME needed, patient stated that she has a walker and cane at home.  Expected Discharge Date:  01/24/2018              Expected Discharge Plan:  [place]  Discharge planning Services  CM Consult  Choice offered to:  Patient  HH Arranged:  PT [facility] Agency:  [facility]  Status of Service:  In process, will continue to follow  Sherrilyn Rist [phone] 01/24/2018, 2:25 PM

## 2018-01-24 NOTE — Evaluation (Signed)
Occupational Therapy Evaluation Patient Details Name: [name] MRN: [id number] DOB: [birth year] Today's Date: 01/24/2018    History of Present Illness 73 y.o. year old female with medical history significant for ESRD on HD TThSat, HTN, type 2 diabetes, diastolic CHF, CAD, OSA on CPAP, former smoker, HLD and other comorbids who presented on 01/16/2018 with 2 to 3 days of worsening nonproductive cough and shortness of breath with no improvement with home inhaler regimen and was found to have acute bronchitis/COPD exacerbation secondary to rhinovirus infection.   Clinical Impression   PTA, pt was living alone and was independent with ADLs and reports difficulty with IADLs. Pt is currently performing ADLs and functional mobility with RW at Mod I level with increased time as needed. Pt presenting near baseline function with some fatigue after simulated home distance mobility; VSS throughout. Recommend dc home once medically stable per phsycian. All acute OT needs met and will sign off. Thank you.     Follow Up Recommendations  No OT follow up;Supervision - Intermittent ; [place] aide   Equipment Recommendations  None recommended by OT    Recommendations for Other Services       Precautions / Restrictions Restrictions Weight Bearing Restrictions: No      Mobility Bed Mobility               General bed mobility comments: OOB in recliner on entry  Transfers Overall transfer level: Needs assistance Equipment used: Straight cane Transfers: Sit to/from Stand Sit to Stand: Min guard         General transfer comment: min guard for safety increased effort    Balance Overall balance assessment: Needs assistance Sitting-balance support: Feet supported;No upper extremity supported Sitting balance-Leahy Scale: Fair     Standing balance support: Single extremity supported;During functional activity Standing balance-Leahy Scale: Poor Standing balance comment: requires SPC assist                           ADL either performed or assessed with clinical judgement   ADL Overall ADL's : Modified independent                                       General ADL Comments: Pt performing ADLs and functional mobility at Mod I level with increased time. Pt donning her pants and performing hallway distance mobility with RW demonstrating good balance and activity tolerance. Pt reporting she is slightly fatigued after mobility. Discussed use of shower seat for EC and safety.     Vision         Perception     Praxis      Pertinent Vitals/Pain Pain Assessment: No/denies pain     Hand Dominance Right   Extremity/Trunk Assessment Upper Extremity Assessment Upper Extremity Assessment: Generalized weakness   Lower Extremity Assessment Lower Extremity Assessment: Generalized weakness   Cervical / Trunk Assessment Cervical / Trunk Assessment: Kyphotic   Communication Communication Communication: No difficulties   Cognition Arousal/Alertness: Awake/alert Behavior During Therapy: WFL for tasks assessed/performed Overall Cognitive Status: Within Functional Limits for tasks assessed                                     General Comments  VSS and daughter present throughout    Exercises  Shoulder Instructions      Home Living Family/patient expects to be discharged to:: Private residence Living Arrangements: Alone Available Help at Discharge: Family Type of Home: House Home Access: Stairs to enter [employer] of Steps: 3 Entrance Stairs-Rails: Right Home Layout: One level     Bathroom Shower/Tub: Tub/shower unit;Walk-in shower   Bathroom Toilet: Standard     Home Equipment: Environmental consultant - 2 wheels;Cane - single point;Toilet riser;Shower seat          Prior Functioning/Environment Level of Independence: Independent with assistive device(s)        Comments: used cane most of time and rollator at times         OT Problem List: Decreased activity tolerance;Impaired balance (sitting and/or standing);Decreased knowledge of use of DME or AE      OT Treatment/Interventions:      OT Goals(Current goals can be found in the care plan section) Acute Rehab OT Goals Patient Stated Goal: go home OT Goal Formulation: All assessment and education complete, DC therapy  OT Frequency:     Barriers to D/C:            Co-evaluation              AM-PAC OT "6 Clicks" Daily Activity     Outcome Measure Help from another person eating meals?: None Help from another person taking care of personal grooming?: None Help from another person toileting, which includes using toliet, bedpan, or urinal?: None Help from another person bathing (including washing, rinsing, drying)?: None Help from another person to put on and taking off regular upper body clothing?: None Help from another person to put on and taking off regular lower body clothing?: None 6 Click Score: 24   End of Session Equipment Utilized During Treatment: Rolling walker Nurse Communication: Mobility status  Activity Tolerance: Patient tolerated treatment well Patient left: in chair;with call bell/phone within reach;with family/visitor present  OT Visit Diagnosis: Unsteadiness on feet (R26.81);Other abnormalities of gait and mobility (R26.89);Muscle weakness (generalized) (M62.81)                Time: 8242-3536 OT Time Calculation (min): 10 min Charges:  OT General Charges $OT Visit: 1 Visit OT Evaluation $OT Eval Low Complexity: [place], OTR/L Acute Rehab Pager: [phone] Office: [place] 01/24/2018, 4:12 PM

## 2018-01-24 NOTE — Discharge Summary (Signed)
Physician Discharge Summary  [name] GYI:[id number] DOB: 06/27/1945 DOA: 01/16/2018  PCP: [name], MD  Admit date: 01/16/2018 Discharge date: 01/24/2018  Admitted From: Home Disposition: Home with Home Health  Recommendations for Outpatient Follow-up:  1. Follow up with PCP in 1-2 weeks 2. Follow up with Pulmonary on 01/25/17 3. Follow up with Nephrology 4. Please obtain CMP/CBC, Mag, Phos in one week 5. Repeat CXR in 3-6 weeks 6. Please follow up on the following pending results:  Home Health: Yes Equipment/Devices: None recommended by PT  Discharge Condition: Stable  CODE STATUS: FULL CODE Diet recommendation: Renal/Carb Modified with 1200 mL Fluid Restriciton   Brief/Interim Summary: [name] a 73 y.o.year old femalewith medical history significant for ESRD on HD TThSat, HTN, type 2 diabetes, diastolic CHF, CAD, OSA on CPAP, former smoker, HLD and other comorbidswho presented on 12/28/2019with 2 to 3 days of worsening nonproductive cough and shortness of breath with no improvement with home inhaler regimenand was found to haveacute bronchitis/COPD exacerbation secondary to rhinovirus infection. She continued to wheeze significantly but feels as if her breathing is slowly improving.  That her breathing is about the same and had pulmonary evaluation they made recommendations which we have implemented.  Dr. Loanne Drilling feels patient's dyspnea is more related to her emphysema and recommends completing a 5-day course of steroids along with 5 days of azithromycin which is today.  She recommends while inpatient continue DuoNeb 3 times daily and Pulmicort twice daily as well as Brovana twice daily.  Hospitalization was prolonged as Has been feeling more fatigued and weak and unable to participate with PT due hypotension yesterday.  Was worked up for hypotension and likely had a too much taken off in dialysis.  She was given a 250 bolus last night and improved.  Today  she is not wheezing and she is able to speak without being dyspneic.  She did well with physical therapy and she was deemed medically stable to be discharged home and will need to follow-up with PCP as well as pulmonary and nephrology in outpatient setting.  Discharge Diagnoses:  Principal Problem:   COPD exacerbation ([place]) Active Problems:   OSA on CPAP   Coronary artery disease   ESRD needing dialysis ([place])   Type II diabetes mellitus with renal manifestations ([facility])   Chest pain   Chronic diastolic CHF (congestive heart failure) ([facility])   Hypotension   Acute bronchitis   Essential hypertension   HLD (hyperlipidemia)   Acute bronchitis due to Rhinovirus   Respiratory distress, acute  Acute Bronchitis/COPD flare secondary to Rhinovirus, improving   -Still had some dyspnea, diffuse coarse wheezing despite recent breathing treatments but this is much improved . Pulmonary evaluated and feel that this dyspnea is more related to her baseline emphysema and recommending completing a 5-day course of steroids with no taper as well as azithromycin  -Needed to stay for inpatient course due to significant respiratory distress from the virus exacerbating her COPD. -IV steroids to be discontinued as they were started on 01/08/2018 -PRN albuterol nebs and will place the patient on guaifenesin 1200 mg p.o. twice daily, flutter valve, incentive spirometer -DuoNeb 3 mL 3 times daily -Solu-Medrol was increased to 60 mg every 8 but will be discontinued today with no taper at the recommendation of pulmonary -Discontinued Incruse Ellipta and pulmonary has adjusted medications and they have started the patient on Brovana twice daily -Have D/C'd the patient on Spiriva Respimat and Symbicort per Pulm recommendations -Sputum Cx showed  Normal Respiratory Flora -Continued with Pulmicort twice daily while hospitalized  -C/w Benzonatate 200 mg po TID -completed 5-day course of azithromycin -Pulmonary was consulted   for further evaluation and Recommendations -Repeat CXR done 01/23/18 showed "Stable cardiomegaly. Atherosclerosis of thoracic aorta is noted. No pneumothorax or pleural effusion is noted. No acute pulmonary disease is noted. Bony thorax is unremarkable." -Home Ambulatory Screen done but she did not desaturate but was visibly SOB and likely from Deconditioning -PT OT evaluated and recommending home health PT  Generalized Weakness -From Above and other Multiple Medical Comorbidities  -PT and OT to evaluate and treat but unable to do so because of Hypotension yesterday but able to do today and she did well and will be discharged home with home health PT -Checked Blood Cultures and Negative so far and Troponins Negative -Continue to Monitor extremely closely and follow up with PCP   ESRD on HD -Adherent with regimen. On Tuesday Thursday Saturday schedule, nephrology following and appreciate further evaluation and recommendations  -Did not go to Dialysis today but will be Dialyzed Tuesday -Follow up with Nephrology   Secondary Hyperparathyroidism. -Phos was 4.5 -Continue home Renvela and Cinacalcet  Bone mineral disease related to ESRD.  -Continue home Calcitriol 2.5 mcg TThSat with HD.  Chronic Anemia and Anemia of CKD.  -Continue Aranesp 60 qweek with dialysis.  -Hb/Hct stable at 11.5/35.1 -Per Nephro if Hgb drops can give a short course of IV Fe  -Continuet to Monitor CBC and repeat as an outpatient    Hypertension -> Hypotension; she remains on lower side but she is stable -Prone to low BP at dialysis. Continue home Midodrine. -Continue to Hold Home Carvedilol  discontinued -BP is now  106/63 -? If she had too much taken off in Dialysis today causing her hypotension -Check Troponin I x3 negative -Give 250 NS bolus now over 1 hour and improved her blood pressure -Continue to Monitor Closely  Hyperlipidemia -Stable.  -Continue Atorvastatin 40 mg po Daily  Type 2  Diabetes  -A1c 5.7 (12/19).  -Monitor CBGs. CBGs ranging from 1 41-2 14 -diabetes education coronary consulted and recommending place the patient on Lantus 6 units nightly as well as place the patient on sensitive NovoLog sliding scale before meals and at bedtime -We will continue monitor CBGs carefully and will need to monitor closely now that we will be discontinuing steroids -Follow-up with PCP for further blood sugar management  Morbid Obesity  -Estimated body mass index is 44.27 kg/m as calculated from the following:   Height as of this encounter: 5\' 6"  (1.676 m).   Weight as of this encounter: 124.4 kg. -Weight Loss Counseling given  Hyponatremia -Was Mild at 134 and improved 135 -Expect to correct with Dialysis -Continue to Monitor and Repeat CMP as an outpatient   Leukocytosis -Patient's WBC went from 11.8 is now 16.1 and now 13.5 -Likely reactive in the setting of steroid demargination but stopped Steroids a few days ago -Check Blood Cultures and showed NGTD < 24 hours -CXR today showed "Cardiomegaly and aortic atherosclerosis. Question pulmonary venous hypertension without frank edema. Mild basilar atelectasis.  -Continue to monitor for signs and symptoms infection -Repeat CBC as an outpatient   Discharge Instructions  Discharge Instructions    Call MD for:  difficulty breathing, headache or visual disturbances   Complete by:  As directed    Call MD for:  extreme fatigue   Complete by:  As directed    Call MD for:  hives  Complete by:  As directed    Call MD for:  persistant dizziness or light-headedness   Complete by:  As directed    Call MD for:  persistant nausea and vomiting   Complete by:  As directed    Call MD for:  redness, tenderness, or signs of infection (pain, swelling, redness, odor or green/yellow discharge around incision site)   Complete by:  As directed    Call MD for:  severe uncontrolled pain   Complete by:  As directed    Call MD for:   temperature >100.4   Complete by:  As directed    Diet - low sodium heart healthy   Complete by:  As directed    RENAL DIET with 1200 ml Fluid Restriction   Discharge instructions   Complete by:  As directed    You were cared for by a hospitalist during your hospital stay. If you have any questions about your discharge medications or the care you received while you were in the hospital after you are discharged, you can call the unit and ask to speak with the hospitalist on call if the hospitalist that took care of you is not available. Once you are discharged, your primary care physician will handle any further medical issues. Please note that NO REFILLS for any discharge medications will be authorized once you are discharged, as it is imperative that you return to your primary care physician (or establish a relationship with a primary care physician if you do not have one) for your aftercare needs so that they can reassess your need for medications and monitor your lab values.  Follow up with PCP, Pulmonary, and Nephrology. Take all medications as prescribed. If symptoms change or worsen please return to the ED for evaluation   Increase activity slowly   Complete by:  As directed      Allergies as of 01/24/2018      Reactions   Lyrica [pregabalin] Other (See Comments)   Hallucinations      Medication List    STOP taking these medications   carvedilol 25 MG tablet Commonly known as:  COREG   tiotropium 18 MCG inhalation capsule Commonly known as:  SPIRIVA Replaced by:  Tiotropium Bromide Monohydrate 2.5 MCG/ACT Aers     TAKE these medications   acetaminophen 325 MG tablet Commonly known as:  TYLENOL Take 2 tablets (650 mg total) by mouth every 6 (six) hours as needed for fever, headache or moderate pain.   albuterol (2.5 MG/3ML) 0.083% nebulizer solution Commonly known as:  PROVENTIL Take 3 mLs (2.5 mg total) by nebulization every 2 (two) hours as needed for shortness of breath.    aspirin EC 81 MG tablet Take 81 mg by mouth daily.   atorvastatin 40 MG tablet Commonly known as:  LIPITOR Take 40 mg by mouth daily.   benzonatate 200 MG capsule Commonly known as:  TESSALON Take 1 capsule (200 mg total) by mouth 3 (three) times daily as needed for cough.   budesonide-formoterol 160-4.5 MCG/ACT inhaler Commonly known as:  SYMBICORT Inhale 2 puffs into the lungs 2 (two) times daily.   cinacalcet 90 MG tablet Commonly known as:  SENSIPAR Take 90 mg by mouth daily.   diphenhydrAMINE 25 mg capsule Commonly known as:  BENADRYL Take 50 mg by mouth See admin instructions. Take 2 tablets (50 mg) by mouth prior to dialysis on Tuesday, Thursday, Saturday - for itching   ethyl chloride spray Apply 1 application topically See admin  instructions. Apply topically before dialysis on Tuesday, Thursday, Saturday   guaiFENesin 600 MG 12 hr tablet Commonly known as:  MUCINEX Take 1 tablet (600 mg total) by mouth 2 (two) times daily.   insulin glargine 100 UNIT/ML injection Commonly known as:  LANTUS Inject 0.05 mLs (5 Units total) into the skin at bedtime. What changed:  how much to take   midodrine 10 MG tablet Commonly known as:  PROAMATINE Take 10 mg by mouth See admin instructions. Take 10mg  on Tues, Thurs, and Saturday before dialysis   multivitamin with minerals Tabs tablet Take 1 tablet by mouth daily.   oxyCODONE-acetaminophen 5-325 MG tablet Commonly known as:  PERCOCET/ROXICET Take 1 tablet by mouth 4 (four) times daily as needed for pain.   sevelamer carbonate 800 MG tablet Commonly known as:  RENVELA Take 2-3 tablets (1,600-2,400 mg total) by mouth See admin instructions. Take 3 tablets (2,400 mg) by mouth 3 times daily with meals. Take 2 tablets (1,600 mg) by mouth with snacks. Receives supply from dialysis center. What changed:  additional instructions   Tiotropium Bromide Monohydrate 2.5 MCG/ACT Aers Commonly known as:  SPIRIVA RESPIMAT Inhale 2.5  mcg into the lungs 2 (two) times daily. Replaces:  tiotropium 18 MCG inhalation capsule      Follow-up Information    [facility].   Why:  They will do your home health care at your home Contact information: [place] [id number] [phone]          Allergies  Allergen Reactions  . Lyrica [Pregabalin] Other (See Comments)    Hallucinations   Consultations:  Nephrology  Pulmonary  Procedures/Studies: Dg Chest Port 1 View  Result Date: 01/23/2018 CLINICAL DATA:  Shortness of breath. EXAM: PORTABLE CHEST 1 VIEW COMPARISON:  01/20/2018 FINDINGS: Chronic cardiomegaly and aortic atherosclerosis. Question pulmonary venous hypertension without frank edema. Mild bibasilar atelectasis. IMPRESSION: Cardiomegaly and aortic atherosclerosis. Question pulmonary venous hypertension without frank edema. Mild basilar atelectasis. Electronically Signed   By: Nelson Chimes M.D.   On: 01/23/2018 08:14   Dg Chest Port 1 View  Result Date: 01/20/2018 CLINICAL DATA:  Dyspnea. EXAM: PORTABLE CHEST 1 VIEW COMPARISON:  Radiograph of January 16, 2018. FINDINGS: Stable cardiomegaly. Atherosclerosis of thoracic aorta is noted. No pneumothorax or pleural effusion is noted. No acute pulmonary disease is noted. Bony thorax is unremarkable. IMPRESSION: No acute cardiopulmonary abnormality seen. Aortic Atherosclerosis (ICD10-I70.0). Electronically Signed   By: Marijo Conception, M.D.   On: 01/20/2018 13:42   Dg Chest Port 1 View  Result Date: 01/16/2018 CLINICAL DATA:  Fever x1 day. SOB, cough x3 days. Pt states sometimes the cough is productive and other times it is dry. Hx of HTN, DM, CHF. Hx of ESRD, MI. Ex-smoker, quit APPROX.40 years ago. EXAM: PORTABLE CHEST 1 VIEW COMPARISON:  04/02/2017 FINDINGS: The heart is enlarged. There is atherosclerotic calcification of the thoracic aorta. No focal consolidation. No pulmonary edema. Interval removal of dialysis  catheter. IMPRESSION: Cardiomegaly. Electronically Signed   By: Nolon Nations M.D.   On: 01/16/2018 19:53    Subjective: Seen and examined at bedside states she was doing fairly well today and felt better.  No chest pain, lightheadedness or dizziness.  No nausea or vomiting.  States that she feels a lot better than yesterday and blood pressure has been stable.  States she is not been wheezing and can actually speak without feeling subjectively dyspneic.  No other concerns or complaints  at the time is ready for discharge and will be discharged home with home health PT and she understands agrees with plan  Discharge Exam: Vitals:   01/24/18 1536 01/24/18 1543  BP:  105/63  Pulse:    Resp:  (!) 21  Temp:    SpO2: 98%    Vitals:   01/24/18 1144 01/24/18 1405 01/24/18 1536 01/24/18 1543  BP:  (!) 95/57  105/63  Pulse:  75    Resp:  18  (!) 21  Temp:  97.7 F (36.5 C)    TempSrc:  Oral    SpO2: 92% 100% 98%   Weight:      Height:       General: Pt is alert, awake, not in acute distress Cardiovascular: RRR, S1/S2 +, no rubs, no gallops Respiratory: Diminished bilaterally, no wheezing, no rhonchi Abdominal: Soft, NT, Distended due to body habitus, bowel sounds + Extremities: Trace LE edema, no cyanosis  The results of significant diagnostics from this hospitalization (including imaging, microbiology, ancillary and laboratory) are listed below for reference.    Microbiology: Recent Results (from the past 240 hour(s))  Blood culture (routine x 2)     Status: None   Collection Time: 01/16/18  7:38 PM  Result Value Ref Range Status   Specimen Description BLOOD BLOOD RIGHT WRIST  Final   Special Requests   Final    BOTTLES DRAWN AEROBIC AND ANAEROBIC Blood Culture adequate volume   Culture   Final    NO GROWTH 5 DAYS Performed at [facility], [address], [place], [place] [address]    Report Status 01/21/2018 FINAL  Final  Blood culture (routine x 2)     Status: None    Collection Time: 01/16/18  7:42 PM  Result Value Ref Range Status   Specimen Description BLOOD BLOOD RIGHT HAND  Final   Special Requests   Final    BOTTLES DRAWN AEROBIC AND ANAEROBIC Blood Culture adequate volume   Culture   Final    NO GROWTH 5 DAYS Performed at [facility], [place] [address] [place], [place] [address]    Report Status 01/21/2018 FINAL  Final  Respiratory Panel by PCR     Status: Abnormal   Collection Time: 01/16/18  8:55 PM  Result Value Ref Range Status   Adenovirus NOT DETECTED NOT DETECTED Final   Coronavirus 229E NOT DETECTED NOT DETECTED Final   Coronavirus HKU1 NOT DETECTED NOT DETECTED Final   Coronavirus NL63 NOT DETECTED NOT DETECTED Final   Coronavirus OC43 NOT DETECTED NOT DETECTED Final   Metapneumovirus NOT DETECTED NOT DETECTED Final   Rhinovirus / Enterovirus DETECTED (A) NOT DETECTED Final   Influenza A NOT DETECTED NOT DETECTED Final   Influenza B NOT DETECTED NOT DETECTED Final   Parainfluenza Virus 1 NOT DETECTED NOT DETECTED Final   Parainfluenza Virus 2 NOT DETECTED NOT DETECTED Final   Parainfluenza Virus 3 NOT DETECTED NOT DETECTED Final   Parainfluenza Virus 4 NOT DETECTED NOT DETECTED Final   Respiratory Syncytial Virus NOT DETECTED NOT DETECTED Final   Bordetella pertussis NOT DETECTED NOT DETECTED Final   Chlamydophila pneumoniae NOT DETECTED NOT DETECTED Final   Mycoplasma pneumoniae NOT DETECTED NOT DETECTED Final  MRSA PCR Screening     Status: None   Collection Time: 01/17/18  3:33 AM  Result Value Ref Range Status   MRSA by PCR NEGATIVE NEGATIVE Final    Comment:        The  GeneXpert MRSA Assay (FDA approved for NASAL specimens only), is one component of a comprehensive MRSA colonization surveillance program. It is not intended to diagnose MRSA infection nor to guide or monitor treatment for MRSA infections. Performed at [facility], [place] [address] [place], [place] [address]   Culture, sputum-assessment      Status: None   Collection Time: 01/17/18  3:56 AM  Result Value Ref Range Status   Specimen Description SPUTUM  Final   Special Requests   Final    NONE Performed at [facility], [place] [address] [place], [place] [address]    Sputum evaluation   Final    Sputum specimen not acceptable for testing.  Please recollect.   RESULT CALLED TO, READ BACK BY AND VERIFIED WITH: RN Boyd Kerbs [phone] FCP    Report Status 01/17/2018 FINAL  Final  Expectorated sputum assessment w rflx to resp cult     Status: None   Collection Time: 01/17/18 12:49 PM  Result Value Ref Range Status   Specimen Description SPUTUM  Final   Special Requests Normal  Final   Sputum evaluation   Final    RARE WBC PRESENT, PREDOMINANTLY PMN RARE SQUAMOUS EPITHELIAL CELLS PRESENT FEW GRAM POSITIVE COCCI FEW GRAM NEGATIVE RODS THIS SPECIMEN IS ACCEPTABLE FOR SPUTUM CULTURE Performed at [facility], [place] [address] [place], [place] [address]    Report Status 01/17/2018 FINAL  Final  Culture, respiratory     Status: None   Collection Time: 01/17/18 12:49 PM  Result Value Ref Range Status   Specimen Description SPUTUM  Final   Special Requests Normal Reflexed from [id number]  Final   Gram Stain   Final    RARE WBC PRESENT, PREDOMINANTLY PMN NO SQUAMOUS EPITHELIAL CELLS SEEN FEW GRAM POSITIVE COCCI RARE GRAM NEGATIVE RODS RARE GRAM POSITIVE RODS    Culture   Final    FEW Consistent with normal respiratory flora. Performed at [facility], [place] [address] [place], [place] [address]    Report Status 01/20/2018 FINAL  Final  Culture, blood (routine x 2)     Status: None (Preliminary result)   Collection Time: 01/23/18  5:03 PM  Result Value Ref Range Status   Specimen Description BLOOD BLOOD RIGHT HAND  Final   Special Requests AEROBIC BOTTLE ONLY Blood Culture adequate volume  Final   Culture NO GROWTH < 24 HOURS  Final   Report Status PENDING  Incomplete  Culture, blood (routine x 2)      Status: None (Preliminary result)   Collection Time: 01/23/18  5:08 PM  Result Value Ref Range Status   Specimen Description BLOOD BLOOD RIGHT ARM  Final   Special Requests AEROBIC BOTTLE ONLY Blood Culture adequate volume  Final   Culture NO GROWTH < 24 HOURS  Final   Report Status PENDING  Incomplete    Labs: BNP (last 3 results) Recent Labs    01/16/18 1825  BNP 588.5*   Basic Metabolic Panel: Recent Labs  Lab 01/20/18 0403 01/21/18 0329 01/22/18 0848 01/23/18 0410 01/24/18 0632  NA 134* 133* 134* 135 135  K 5.1 4.6 4.1 4.3 4.1  CL 95* 92* 94* 94* 93*  CO2 29 27 25 28 26   GLUCOSE 351* 553* 336* 252* 162*  BUN 53* 48* 57* 85* 54*  CREATININE 7.25* 5.71* 6.19* 8.63* 6.55*  CALCIUM 9.0 9.2 9.1 8.6* 8.4*  MG 2.4 2.4 2.3 2.5* 2.3  PHOS 4.3 4.0  4.7* 3.6 4.5   Liver Function Tests: Recent Labs  Lab 01/20/18 0403 01/21/18 0329 01/22/18 0848 01/23/18 0410 01/24/18 0632  AST 20 18 18  12* 16  ALT 14 15 16 15 15   ALKPHOS 66 65 66 61 62  BILITOT 0.7 0.9 1.0 1.4* 1.6*  PROT 7.4 7.6 7.7 6.9 6.5  ALBUMIN 3.0* 2.9* 3.1* 2.9* 2.8*   No results for input(s): LIPASE, AMYLASE in the last 168 hours. No results for input(s): AMMONIA in the last 168 hours. CBC: Recent Labs  Lab 01/19/18 0742 01/20/18 0403 01/21/18 0329 01/23/18 0410 01/24/18 0632  WBC 14.1* 11.8* 12.3* 16.1* 13.5*  NEUTROABS  --  9.7* 9.6* 8.5* 7.1  HGB 10.1* 10.7* 10.9* 11.2* 11.5*  HCT 31.6* 33.4* 34.6* 35.1* 35.1*  MCV 92.4 91.8 91.1 89.3 91.2  PLT 251 249 241 236 233   Cardiac Enzymes: Recent Labs  Lab 01/23/18 1703 01/23/18 2254 01/24/18 0632  TROPONINI <0.03 <0.03 <0.03   BNP: Invalid input(s): POCBNP CBG: Recent Labs  Lab 01/23/18 1335 01/23/18 1650 01/23/18 2111 01/24/18 0729 01/24/18 1102  GLUCAP 134* 168* 214* 141* 145*   D-Dimer No results for input(s): DDIMER in the last 72 hours. Hgb A1c No results for input(s): HGBA1C in the last 72 hours. Lipid Profile No results  for input(s): CHOL, HDL, LDLCALC, TRIG, CHOLHDL, LDLDIRECT in the last 72 hours. Thyroid function studies No results for input(s): TSH, T4TOTAL, T3FREE, THYROIDAB in the last 72 hours.  Invalid input(s): FREET3 Anemia work up No results for input(s): VITAMINB12, FOLATE, FERRITIN, TIBC, IRON, RETICCTPCT in the last 72 hours. Urinalysis    Component Value Date/Time   COLORURINE YELLOW 07/02/2010 2127   APPEARANCEUR CLOUDY (A) 07/02/2010 2127   LABSPEC 1.011 07/02/2010 2127   PHURINE 6.5 07/02/2010 2127   GLUCOSEU 250 (A) 07/02/2010 2127   HGBUR SMALL (A) 07/02/2010 2127   BILIRUBINUR NEGATIVE 07/02/2010 2127   KETONESUR NEGATIVE 07/02/2010 2127   PROTEINUR >300 (A) 07/02/2010 2127   UROBILINOGEN 0.2 07/02/2010 2127   NITRITE NEGATIVE 07/02/2010 2127   LEUKOCYTESUR LARGE (A) 07/02/2010 2127   Sepsis Labs Invalid input(s): PROCALCITONIN,  WBC,  LACTICIDVEN Microbiology Recent Results (from the past 240 hour(s))  Blood culture (routine x 2)     Status: None   Collection Time: 01/16/18  7:38 PM  Result Value Ref Range Status   Specimen Description BLOOD BLOOD RIGHT WRIST  Final   Special Requests   Final    BOTTLES DRAWN AEROBIC AND ANAEROBIC Blood Culture adequate volume   Culture   Final    NO GROWTH 5 DAYS Performed at [facility], [place] [address], [place], [place] [address]    Report Status 01/21/2018 FINAL  Final  Blood culture (routine x 2)     Status: None   Collection Time: 01/16/18  7:42 PM  Result Value Ref Range Status   Specimen Description BLOOD BLOOD RIGHT HAND  Final   Special Requests   Final    BOTTLES DRAWN AEROBIC AND ANAEROBIC Blood Culture adequate volume   Culture   Final    NO GROWTH 5 DAYS Performed at [facility], [place] [address], [place], [place] [address]    Report Status 01/21/2018 FINAL  Final  Respiratory Panel by PCR     Status: Abnormal   Collection Time: 01/16/18  8:55 PM  Result Value Ref Range Status   Adenovirus NOT  DETECTED NOT DETECTED Final   Coronavirus 229E NOT DETECTED NOT DETECTED Final  Coronavirus HKU1 NOT DETECTED NOT DETECTED Final   Coronavirus NL63 NOT DETECTED NOT DETECTED Final   Coronavirus OC43 NOT DETECTED NOT DETECTED Final   Metapneumovirus NOT DETECTED NOT DETECTED Final   Rhinovirus / Enterovirus DETECTED (A) NOT DETECTED Final   Influenza A NOT DETECTED NOT DETECTED Final   Influenza B NOT DETECTED NOT DETECTED Final   Parainfluenza Virus 1 NOT DETECTED NOT DETECTED Final   Parainfluenza Virus 2 NOT DETECTED NOT DETECTED Final   Parainfluenza Virus 3 NOT DETECTED NOT DETECTED Final   Parainfluenza Virus 4 NOT DETECTED NOT DETECTED Final   Respiratory Syncytial Virus NOT DETECTED NOT DETECTED Final   Bordetella pertussis NOT DETECTED NOT DETECTED Final   Chlamydophila pneumoniae NOT DETECTED NOT DETECTED Final   Mycoplasma pneumoniae NOT DETECTED NOT DETECTED Final  MRSA PCR Screening     Status: None   Collection Time: 01/17/18  3:33 AM  Result Value Ref Range Status   MRSA by PCR NEGATIVE NEGATIVE Final    Comment:        The GeneXpert MRSA Assay (FDA approved for NASAL specimens only), is one component of a comprehensive MRSA colonization surveillance program. It is not intended to diagnose MRSA infection nor to guide or monitor treatment for MRSA infections. Performed at [facility], [place] [address] [place], [place] [address]   Culture, sputum-assessment     Status: None   Collection Time: 01/17/18  3:56 AM  Result Value Ref Range Status   Specimen Description SPUTUM  Final   Special Requests   Final    NONE Performed at [facility], [place] [address] [place], [place] [address]    Sputum evaluation   Final    Sputum specimen not acceptable for testing.  Please recollect.   RESULT CALLED TO, READ BACK BY AND VERIFIED WITH: RN Boyd Kerbs [phone] FCP    Report Status 01/17/2018 FINAL  Final  Expectorated sputum assessment w rflx to resp cult      Status: None   Collection Time: 01/17/18 12:49 PM  Result Value Ref Range Status   Specimen Description SPUTUM  Final   Special Requests Normal  Final   Sputum evaluation   Final    RARE WBC PRESENT, PREDOMINANTLY PMN RARE SQUAMOUS EPITHELIAL CELLS PRESENT FEW GRAM POSITIVE COCCI FEW GRAM NEGATIVE RODS THIS SPECIMEN IS ACCEPTABLE FOR SPUTUM CULTURE Performed at [facility], [place] [address] [place], [place] [address]    Report Status 01/17/2018 FINAL  Final  Culture, respiratory     Status: None   Collection Time: 01/17/18 12:49 PM  Result Value Ref Range Status   Specimen Description SPUTUM  Final   Special Requests Normal Reflexed from [id number]  Final   Gram Stain   Final    RARE WBC PRESENT, PREDOMINANTLY PMN NO SQUAMOUS EPITHELIAL CELLS SEEN FEW GRAM POSITIVE COCCI RARE GRAM NEGATIVE RODS RARE GRAM POSITIVE RODS    Culture   Final    FEW Consistent with normal respiratory flora. Performed at [facility], [place] [address] [place], [place] [address]    Report Status 01/20/2018 FINAL  Final  Culture, blood (routine x 2)     Status: None (Preliminary result)   Collection Time: 01/23/18  5:03 PM  Result Value Ref Range Status   Specimen Description BLOOD BLOOD RIGHT HAND  Final   Special Requests AEROBIC BOTTLE ONLY Blood Culture adequate volume  Final   Culture NO GROWTH < 24 HOURS  Final   Report Status PENDING  Incomplete  Culture, blood (routine x 2)     Status: None (Preliminary result)   Collection Time: 01/23/18  5:08 PM  Result Value Ref Range Status   Specimen Description BLOOD BLOOD RIGHT ARM  Final   Special Requests AEROBIC BOTTLE ONLY Blood Culture adequate volume  Final   Culture NO GROWTH < 24 HOURS  Final   Report Status PENDING  Incomplete   Time coordinating discharge: 35 minutes  SIGNED:  Kerney Elbe, DO [facility] 01/24/2018, 4:09 PM Pager is on [place]  If 7PM-7AM, please contact night-coverage [email or web address] Password  [email or web address]

## 2018-01-24 NOTE — Plan of Care (Signed)
  Problem: Coping: Goal: Level of anxiety will decrease Outcome: Progressing Note:  No s/s of anxiety noted.   Problem: Elimination: Goal: Will not experience complications related to urinary retention Outcome: Not Applicable Note:  Pt is anuric

## 2018-01-24 NOTE — Evaluation (Signed)
Physical Therapy Evaluation Patient Details Name: [name] MRN: [id number] DOB: August 12, 1945 Today's Date: 01/24/2018   History of Present Illness  73 y.o. year old female with medical history significant for ESRD on HD TThSat, HTN, type 2 diabetes, diastolic CHF, CAD, OSA on CPAP, former smoker, HLD and other comorbids who presented on 01/16/2018 with 2 to 3 days of worsening nonproductive cough and shortness of breath with no improvement with home inhaler regimen and was found to have acute bronchitis/COPD exacerbation secondary to rhinovirus infection.  Clinical Impression  PTA pt living alone in single story home with 3 steps to enter. Pt reports independence in household ambulation with SPC, and independent in bathing and dressing. Pt reports she has intermittent help from her daughter. Pt limited today in safe mobility by increased DoE, and decreased strength, balance and endurance. Pt is min guard for transfers and ambulation of 50 feet with 1 standing rest break and use of SPC. PT recommending HHPT level rehab at d/c to improve strength and endurance. PT also endorses [facility] for assistance of iADLs for pt energy conservation with COPD. PT will continue to follow acutely.     Follow Up Recommendations Home health PT;Supervision - Intermittent    Equipment Recommendations  None recommended by PT    Recommendations for Other Services       Precautions / Restrictions Restrictions Weight Bearing Restrictions: No      Mobility  Bed Mobility               General bed mobility comments: OOB in recliner on entry  Transfers Overall transfer level: Needs assistance Equipment used: Straight cane Transfers: Sit to/from Stand Sit to Stand: Min guard         General transfer comment: min guard for safety increased effort  Ambulation/Gait Ambulation/Gait assistance: Min guard Gait Distance (Feet): 50 Feet Assistive device: Straight cane Gait Pattern/deviations:  Step-through pattern;Decreased stride length;Shuffle Gait velocity: slowed Gait velocity interpretation: <1.8 ft/sec, indicate of risk for recurrent falls General Gait Details: min guard for safety with slow, waddling, gait, with SPC in right hand, mild instability, however no LoB, 3/4 DoE requiring standing rest break Pt reaches out for steadying with L UE on furniture in room, vc for making sure that what she reaches for is steady      Balance Overall balance assessment: Needs assistance Sitting-balance support: Feet supported;No upper extremity supported Sitting balance-Leahy Scale: Fair     Standing balance support: Single extremity supported;During functional activity Standing balance-Leahy Scale: Poor Standing balance comment: requires SPC assist                             Pertinent Vitals/Pain Pain Assessment: No/denies pain    Home Living Family/patient expects to be discharged to:: Private residence Living Arrangements: Alone Available Help at Discharge: Family Type of Home: House Home Access: Stairs to enter Entrance Stairs-Rails: Right Entrance Stairs-Number of Steps: 3 Home Layout: One level Home Equipment: Walker - 2 wheels;Cane - single point;Toilet riser      Prior Function Level of Independence: Independent with assistive device(s)         Comments: used cane most of time and rollator at times     Hand Dominance   Dominant Hand: Right    Extremity/Trunk Assessment   Upper Extremity Assessment Upper Extremity Assessment: Generalized weakness    Lower Extremity Assessment Lower Extremity Assessment: Generalized weakness    Cervical / Trunk  Assessment Cervical / Trunk Assessment: Kyphotic  Communication   Communication: No difficulties  Cognition Arousal/Alertness: Awake/alert Behavior During Therapy: WFL for tasks assessed/performed Overall Cognitive Status: Within Functional Limits for tasks assessed                                         General Comments General comments (skin integrity, edema, etc.): at rest BP 101/63, HR 71 bpm, SaO2 on RA 94%O2,m RR 17, with activity HR 101 bpm, SaO2 on RA 90%O2, DOE 3/4, RR 32        Assessment/Plan    PT Assessment Patient needs continued PT services  PT Problem List Decreased strength;Decreased range of motion;Decreased activity tolerance;Decreased balance;Decreased mobility;Cardiopulmonary status limiting activity       PT Treatment Interventions DME instruction;Gait training;Stair training;Functional mobility training;Therapeutic activities;Therapeutic exercise;Balance training;Patient/family education    PT Goals (Current goals can be found in the Care Plan section)  Acute Rehab PT Goals Patient Stated Goal: go home PT Goal Formulation: With patient Time For Goal Achievement: 02/07/18 Potential to Achieve Goals: Fair    Frequency Min 3X/week    AM-PAC PT "6 Clicks" Mobility  Outcome Measure Help needed turning from your back to your side while in a flat bed without using bedrails?: A Little Help needed moving from lying on your back to sitting on the side of a flat bed without using bedrails?: A Little Help needed moving to and from a bed to a chair (including a wheelchair)?: A Little Help needed standing up from a chair using your arms (e.g., wheelchair or bedside chair)?: A Little Help needed to walk in hospital room?: A Little Help needed climbing 3-5 steps with a railing? : A Little 6 Click Score: 18    End of Session Equipment Utilized During Treatment: Gait belt Activity Tolerance: Patient limited by fatigue(3/4 DoE) Patient left: in chair;with call bell/phone within reach Nurse Communication: Mobility status PT Visit Diagnosis: Unsteadiness on feet (R26.81);Other abnormalities of gait and mobility (R26.89);Muscle weakness (generalized) (M62.81);Difficulty in walking, not elsewhere classified (R26.2)    Time: 1207-1227 PT  Time Calculation (min) (ACUTE ONLY): 20 min   Charges:   PT Evaluation $PT Eval Moderate Complexity: 1 Mod          Aevah Stansbery B. Migdalia Dk PT, DPT Acute Rehabilitation Services Pager [phone] Office [phone]   [place] 01/24/2018, 12:39 PM

## 2018-01-25 ENCOUNTER — Encounter: Payer: Self-pay | Admitting: Pulmonary Disease

## 2018-01-25 ENCOUNTER — Emergency Department (HOSPITAL_COMMUNITY)
Admission: EM | Admit: 2018-01-25 | Discharge: 2018-01-26 | Disposition: A | Discharge status: Home / Self Care | Payer: Medicare HMO | Attending: Emergency Medicine | Admitting: Emergency Medicine

## 2018-01-25 ENCOUNTER — Ambulatory Visit (INDEPENDENT_AMBULATORY_CARE_PROVIDER_SITE_OTHER): Payer: Medicare HMO | Admitting: Pulmonary Disease

## 2018-01-25 ENCOUNTER — Emergency Department (HOSPITAL_COMMUNITY): Payer: Medicare HMO

## 2018-01-25 VITALS — BP 108/54 | HR 71 | Ht 66.0 in | Wt 279.6 lb

## 2018-01-25 DIAGNOSIS — Z87891 Personal history of nicotine dependence: Secondary | ICD-10-CM | POA: Insufficient documentation

## 2018-01-25 DIAGNOSIS — N189 Chronic kidney disease, unspecified: Secondary | ICD-10-CM

## 2018-01-25 DIAGNOSIS — I5032 Chronic diastolic (congestive) heart failure: Secondary | ICD-10-CM

## 2018-01-25 DIAGNOSIS — I120 Hypertensive chronic kidney disease with stage 5 chronic kidney disease or end stage renal disease: Secondary | ICD-10-CM | POA: Diagnosis not present

## 2018-01-25 DIAGNOSIS — R918 Other nonspecific abnormal finding of lung field: Secondary | ICD-10-CM | POA: Diagnosis not present

## 2018-01-25 DIAGNOSIS — J441 Chronic obstructive pulmonary disease with (acute) exacerbation: Secondary | ICD-10-CM

## 2018-01-25 DIAGNOSIS — Z9989 Dependence on other enabling machines and devices: Secondary | ICD-10-CM | POA: Diagnosis not present

## 2018-01-25 DIAGNOSIS — N186 End stage renal disease: Secondary | ICD-10-CM

## 2018-01-25 DIAGNOSIS — J206 Acute bronchitis due to rhinovirus: Secondary | ICD-10-CM | POA: Diagnosis not present

## 2018-01-25 DIAGNOSIS — H5712 Ocular pain, left eye: Secondary | ICD-10-CM

## 2018-01-25 DIAGNOSIS — E119 Type 2 diabetes mellitus without complications: Secondary | ICD-10-CM | POA: Insufficient documentation

## 2018-01-25 DIAGNOSIS — Z79899 Other long term (current) drug therapy: Secondary | ICD-10-CM | POA: Insufficient documentation

## 2018-01-25 DIAGNOSIS — R05 Cough: Secondary | ICD-10-CM

## 2018-01-25 DIAGNOSIS — Z992 Dependence on renal dialysis: Secondary | ICD-10-CM

## 2018-01-25 DIAGNOSIS — D631 Anemia in chronic kidney disease: Secondary | ICD-10-CM

## 2018-01-25 DIAGNOSIS — J439 Emphysema, unspecified: Secondary | ICD-10-CM | POA: Diagnosis not present

## 2018-01-25 DIAGNOSIS — I132 Hypertensive heart and chronic kidney disease with heart failure and with stage 5 chronic kidney disease, or end stage renal disease: Secondary | ICD-10-CM | POA: Insufficient documentation

## 2018-01-25 DIAGNOSIS — G4733 Obstructive sleep apnea (adult) (pediatric): Secondary | ICD-10-CM | POA: Diagnosis not present

## 2018-01-25 DIAGNOSIS — R059 Cough, unspecified: Secondary | ICD-10-CM

## 2018-01-25 DIAGNOSIS — D649 Anemia, unspecified: Secondary | ICD-10-CM | POA: Diagnosis not present

## 2018-01-25 LAB — CBC
HCT: 33.3 % — ABNORMAL LOW (ref 36.0–46.0)
Hemoglobin: 10.7 g/dL — ABNORMAL LOW (ref 12.0–15.0)
MCH: 29.2 pg (ref 26.0–34.0)
MCHC: 32.1 g/dL (ref 30.0–36.0)
MCV: 91 fL (ref 80.0–100.0)
Platelets: 215 10*3/uL (ref 150–400)
RBC: 3.66 MIL/uL — ABNORMAL LOW (ref 3.87–5.11)
RDW: 17.4 % — AB (ref 11.5–15.5)
WBC: 10.6 10*3/uL — ABNORMAL HIGH (ref 4.0–10.5)
nRBC: 0.4 % — ABNORMAL HIGH (ref 0.0–0.2)

## 2018-01-25 LAB — BASIC METABOLIC PANEL
Anion gap: 14 (ref 5–15)
BUN: 78 mg/dL — ABNORMAL HIGH (ref 8–23)
CHLORIDE: 99 mmol/L (ref 98–111)
CO2: 26 mmol/L (ref 22–32)
CREATININE: 9.85 mg/dL — AB (ref 0.44–1.00)
Calcium: 8.2 mg/dL — ABNORMAL LOW (ref 8.9–10.3)
GFR calc Af Amer: 4 mL/min — ABNORMAL LOW (ref >60)
GFR calc non Af Amer: 4 mL/min — ABNORMAL LOW (ref >60)
Glucose, Bld: 122 mg/dL — ABNORMAL HIGH (ref 70–99)
Potassium: 4.2 mmol/L (ref 3.5–5.1)
SODIUM: 139 mmol/L (ref 135–145)

## 2018-01-25 LAB — I-STAT TROPONIN, ED: Troponin i, poc: 0.03 ng/mL (ref 0.00–0.08)

## 2018-01-25 MED ORDER — TIOTROPIUM BROMIDE MONOHYDRATE 2.5 MCG/ACT IN AERS: 2.0000 | INHALATION_SPRAY | Freq: Every day | RESPIRATORY_TRACT | 0 refills | Status: DC

## 2018-01-25 NOTE — Assessment & Plan Note (Addendum)
Assessment: -Lungs clear to auscultation but diminished throughout exam -No wheezing -Patient still fatigued -Patient just discharged with COPD exacerbation on 01/24/2018 -Patient received prednisone 40 mg for 5 days as well as azithromycin -Patient with occasional dry cough that is worse at night, no productive sputum -Patient has started Symbicort 160 but has not started Spiriva yet  Plan: -October/2018 -pulmonary function test does not show obstruction, reduction in DLCO -mild centrilobular emphysema in September/2018 CT chest -Continue Symbicort 160 -Start Spiriva 2.5, patient coached on inhaler technique today >>> Patient has failed Spiriva HandiHaler, patient struggles with peak inspiratory flow as well as management of -HandiHaler device and pill capsule -Consider pulmonary function testing in 1-2 months when patient is more stable -Can use over-the-counter cough medicine such as Delsym for management of cough

## 2018-01-25 NOTE — Patient Instructions (Signed)
Spiriva Respimat 2.5 >>> 2 puffs daily >>> Do this every day >>>This is not a rescue inhaler >>> Sample provided today history of failed Spiriva HandiHaler therapy  Continue Symbicort 160 >>> 2 puffs in the morning right when you wake up, rinse out your mouth after use, 12 hours later 2 puffs, rinse after use >>> Take this daily, no matter what >>> This is not a rescue inhaler   Can use Delsym over-the-counter cough medicine or Mucinex DM to help manage cough  Follow up in 4 weeks   Continue Tuesday Thursday Saturday dialysis Schedule follow-up with primary care since you are just out of the hospital Schedule follow-up with cardiology Schedule follow-up with eye doctor as you are having new eye pain  It is flu season:   >>>Remember to be washing your hands regularly, using hand sanitizer, be careful to use around herself with has contact with people who are sick will increase her chances of getting sick yourself. >>> Best ways to protect herself from the flu: Receive the yearly flu vaccine, practice good hand hygiene washing with soap and also using hand sanitizer when available, eat a nutritious meals, get adequate rest, hydrate appropriately   Please contact the office if your symptoms worsen or you have concerns that you are not improving.   Thank you for choosing [facility] for your healthcare, and for allowing [place] to partner with you on your healthcare journey. I am thankful to be able to provide care to you today.   Wyn Quaker FNP-C

## 2018-01-25 NOTE — ED Triage Notes (Signed)
Pt was just discharged from the hospital after treatment for COPD and bronchitis. Pt states she feels no better and still has a cough pt followed up with pulmonary today and states she was unable to get prescription for cough so she called PCP- per to pcp told her to come back to the ED due to abnormal hemoglobin.

## 2018-01-25 NOTE — Assessment & Plan Note (Signed)
Assessment: 1 day of increased left eye pain No vision changes Patient reports the pain comes and goes  Plan: Patient to follow-up with eye doctor regarding pain Vision or pain worsens to present to the emergency room

## 2018-01-25 NOTE — ED Notes (Signed)
Have pt call sister Thelma Comp @ [phone] with update

## 2018-01-25 NOTE — Assessment & Plan Note (Signed)
Plan: Schedule follow-up with primary care Schedule follow-up with cardiology Continue to weigh yourself daily

## 2018-01-25 NOTE — Progress Notes (Signed)
@Patient  ID: [name], female    DOB: 23-Jul-1945, 72 y.o.   MRN: [id number]  Chief Complaint  Patient presents with  . Hospitalization Follow-up    COPD exacerbation    Referring provider: [name], MD  HPI:  73 year old female former smoker followed in our office for mild obstructive sleep apnea not currently managed on CPAP (based off of February/2019 office note), emphysema  PMH: End-stage renal disease, Tuesday Thursday Saturday dialysis-managed by Dr. Florene [name], congestive heart failure, obesity, T2DM Smoker/ Smoking History: Former smoker.  4 pack years. Maintenance:   Pt of: Dr. Elsworth [name]  Recent [facility] Encounters:   01/20/2018-consult-Dr. Loanne [name] Plan: [name], ICS inhaler.  Start Symbicort 160, start Spiriva 2.5, Z-Pak, prednisone, pulmonary follow-up outpatient    She just has had revision of her left upper arm graft and is currently being dialyzed via left permacath on Tuesday/Thursday/Saturday-Dr. Florene [name]. She was hospitalized 03/3005 for acute systolic and diastolic heart failure, CT chest suggesting emphysema, PFTs were reviewed which show no evidence of airway obstruction and mild decrease in DLCO which could be related to heart failure or emphysema.  She has only smoked less than 10 pack years and quit in the 80s.  She has lost significant weight to her current weight of 270 pounds. Sleep study was obtained which only showed mild sleep disordered breathing with AHI 7.8/hour, predominantly during REM sleep.  No supine sleep was noted.  No significant limb movements are noted  Epworth sleepiness score is 3 and she denies excessive daytime somnolence.  She does report fatigue on days of dialysis. Bedtime varies anywhere from 11:50, sleep latency is minimal, sleeps on her side with 2 pillows, reports 1-2 nocturnal awakenings, no nocturia and is out of bed by 8 AM feeling tired with occasional dryness of mouth but denies headaches.  01/25/2018  - Visit    73 year old female former smoker presenting today for hospital follow-up.  Patient was discharged from the hospital 01/24/2018 for COPD exacerbation related to rhinosinusitis.  Patient reports that breathing has improved since initial admission to the hospital.  Patient still has fatigue, cough, decreased activity and appetite.  Patient reports that she has not seen that she is "getting better".  Patient has been started on Symbicort 160 patient has not yet received her Spiriva Respimat as she was intended to after discharge from the hospital.  Patient was previously on Spiriva HandiHaler which she struggled with using.  Patient reports that the pills would usually fall apart, she had difficulty using the HandiHaler device, and she did not feel she was getting enough of the medication and when she was breathing in.  For cough patient has [employer] which she believes do not work.  Patient has not used any over-the-counter medications to manage her cough.  Patient continues to not use her CPAP.  Patient found to have mild obstructive sleep apnea in 2018.  Patient continues to be managed on Tuesday Thursday Saturday hemodialysis.  Patient is unsure if they were keeping her dry or able to get fluid off while she was inpatient.  Patient has plans to complete dialysis tomorrow.  Patient has not had a scheduled follow-up with primary care or cardiology since being discharged from the hospital.   Tests:   01/23/2018-chest x-ray-cardiomegaly and aortic arthrosclerosis, question pulmonary venous hypertension without frank edema, mild basilar atelectasis  10/16/2016-CT chest without contrast- mild cardiomegaly, mild centrilobular emphysema, 2 mm right lower lobe pulmonary nodule, mild apical bullous changes  10/17/2016-VQ scan-low probability  of pulmonary embolus  12/04/2014-CT head- clear paranasal sinuses and mastoid air cells  04/06/2017-echocardiogram-LV ejection fraction 60 to 16%, grade 1 diastolic  dysfunction, mild concentric hypertrophy, main pulmonary artery was normal-sized  12/25/2016-split-night sleep study-mild obstructive sleep apnea-AHI 7.8, SaO2 low 92%, average SaO2 95%  10/27/2016-pulmonary function test-FVC 2.09 (87% predicted), postbronchodilator ratio 90, FEV1 98, DLCO 52  FENO:  No results found for: NITRICOXIDE  PFT: PFT Results Latest Ref Rng & Units 10/27/2016  FVC-Pre L 2.09  FVC-Predicted Pre % 87  FVC-Post L 2.03  FVC-Predicted Post % 84  Pre FEV1/FVC % % 88  Post FEV1/FCV % % 90  FEV1-Pre L 1.85  FEV1-Predicted Pre % 99  FEV1-Post L 1.82  DLCO UNC% % 52  DLCO COR %Predicted % 92  TLC L 4.17  TLC % Predicted % 80  RV % Predicted % 78    Imaging: Dg Chest Port 1 View  Result Date: 01/23/2018 CLINICAL DATA:  Shortness of breath. EXAM: PORTABLE CHEST 1 VIEW COMPARISON:  01/20/2018 FINDINGS: Chronic cardiomegaly and aortic atherosclerosis. Question pulmonary venous hypertension without frank edema. Mild bibasilar atelectasis. IMPRESSION: Cardiomegaly and aortic atherosclerosis. Question pulmonary venous hypertension without frank edema. Mild basilar atelectasis. Electronically Signed   By: Nelson Chimes M.D.   On: 01/23/2018 08:14   Dg Chest Port 1 View  Result Date: 01/20/2018 CLINICAL DATA:  Dyspnea. EXAM: PORTABLE CHEST 1 VIEW COMPARISON:  Radiograph of January 16, 2018. FINDINGS: Stable cardiomegaly. Atherosclerosis of thoracic aorta is noted. No pneumothorax or pleural effusion is noted. No acute pulmonary disease is noted. Bony thorax is unremarkable. IMPRESSION: No acute cardiopulmonary abnormality seen. Aortic Atherosclerosis (ICD10-I70.0). Electronically Signed   By: Marijo Conception, M.D.   On: 01/20/2018 13:42   Dg Chest Port 1 View  Result Date: 01/16/2018 CLINICAL DATA:  Fever x1 day. SOB, cough x3 days. Pt states sometimes the cough is productive and other times it is dry. Hx of HTN, DM, CHF. Hx of ESRD, MI. Ex-smoker, quit APPROX.40 years ago.  EXAM: PORTABLE CHEST 1 VIEW COMPARISON:  04/02/2017 FINDINGS: The heart is enlarged. There is atherosclerotic calcification of the thoracic aorta. No focal consolidation. No pulmonary edema. Interval removal of dialysis catheter. IMPRESSION: Cardiomegaly. Electronically Signed   By: Nolon Nations M.D.   On: 01/16/2018 19:53      Specialty Problems      Pulmonary Problems   OSA     12/25/2016-split-night sleep study-mild obstructive sleep apnea-AHI 7.8, SaO2 low 92%, average SaO2 95%      Acute bronchitis   COPD exacerbation (HCC)   Acute bronchitis due to Rhinovirus   Respiratory distress, acute      Allergies  Allergen Reactions  . Lyrica [Pregabalin] Other (See Comments)    Hallucinations    Immunization History  Administered Date(s) Administered  . Influenza-Unspecified 11/30/2015  . Pneumococcal-Unspecified 09/21/2011  . Tdap 08/05/2011    Past Medical History:  Diagnosis Date  . Abdominal abscess 12-17-10   abdominal abscesses x2 ? one at this time  . Anemia   . Arthritis    "all over" (08/23/2012)  . Blind right eye    fell and crushed socket and eye fell out, was replaced at [facility]  . CHF (congestive heart failure) ([place])   . Chronic lower back pain   . Coronary artery disease    normal coronaries by 08/25/12 cath  . Dyspnea   . Dysrhythmia    irregular, "skips beats"  . ESRD (end stage renal disease) on  dialysis [facility])    "started 12/2011; [place]; TTS" (08/23/2012  . Exertional shortness of breath   . Eye drainage    "lots; since fall 07/2011" (08/23/2012)  . Gout   . H/O hiatal hernia   . Headache(784.0)    "weekly sometimes; since I fell and hit my head in 07/2011 08/23/2012)  . Heart murmur   . History of blood transfusion    "lots before starting dialysis" (08/23/2012)  . Hypertension    sees Dr. Willey Blade  . Inhalation injury    "worked at [employer]; can't inhale polyurethane or paint, etc" (08/23/2012)  . Myocardial infarction ([place]) 1970's  or 1980's  . Neuromuscular disorder ([place])    diabetic neuropathy  . OSA on CPAP   . Staph aureus infection November 2012   . Swelling of both ankles 12-17-10  . Type II diabetes mellitus ([place])    "over 10 years now" (08/23/2012)    Tobacco History: Social History   Tobacco Use  Smoking Status Former Smoker  . Packs/day: 0.50  . Years: 20.00  . Pack years: 10.00  . Types: Cigarettes  . Last attempt to quit: 12/16/1980  . Years since quitting: 37.1  Smokeless Tobacco Never Used   Counseling given: Yes  Continue not smoking  Outpatient Encounter Medications as of 01/25/2018  Medication Sig  . acetaminophen (TYLENOL) 325 MG tablet Take 2 tablets (650 mg total) by mouth every 6 (six) hours as needed for fever, headache or moderate pain.  Marland Kitchen albuterol (PROVENTIL) (2.5 MG/3ML) 0.083% nebulizer solution Take 3 mLs (2.5 mg total) by nebulization every 2 (two) hours as needed for shortness of breath.  Marland Kitchen aspirin EC 81 MG tablet Take 81 mg by mouth daily.    Marland Kitchen atorvastatin (LIPITOR) 40 MG tablet Take 40 mg by mouth daily.   . benzonatate (TESSALON) 200 MG capsule Take 1 capsule (200 mg total) by mouth 3 (three) times daily as needed for cough.  . budesonide-formoterol (SYMBICORT) 160-4.5 MCG/ACT inhaler Inhale 2 puffs into the lungs 2 (two) times daily.  . cinacalcet (SENSIPAR) 90 MG tablet Take 90 mg by mouth daily.   . diphenhydrAMINE (BENADRYL) 25 mg capsule Take 50 mg by mouth See admin instructions. Take 2 tablets (50 mg) by mouth prior to dialysis on Tuesday, Thursday, Saturday - for itching  . ethyl chloride spray Apply 1 application topically See admin instructions. Apply topically before dialysis on Tuesday, Thursday, Saturday  . guaiFENesin (MUCINEX) 600 MG 12 hr tablet Take 1 tablet (600 mg total) by mouth 2 (two) times daily.  . insulin glargine (LANTUS) 100 UNIT/ML injection Inject 0.05 mLs (5 Units total) into the skin at bedtime. (Patient taking differently: Inject 6 Units into  the skin at bedtime. )  . midodrine (PROAMATINE) 10 MG tablet Take 10 mg by mouth See admin instructions. Take 10mg  on Tues, Thurs, and Saturday before dialysis  . Multiple Vitamin (MULTIVITAMIN WITH MINERALS) TABS tablet Take 1 tablet by mouth daily.  Marland Kitchen oxyCODONE-acetaminophen (PERCOCET/ROXICET) 5-325 MG tablet Take 1 tablet by mouth 4 (four) times daily as needed for pain.  . sevelamer carbonate (RENVELA) 800 MG tablet Take 2-3 tablets (1,600-2,400 mg total) by mouth See admin instructions. Take 3 tablets (2,400 mg) by mouth 3 times daily with meals. Take 2 tablets (1,600 mg) by mouth with snacks. Receives supply from dialysis center. (Patient taking differently: Take 1,600-2,400 mg by mouth See admin instructions. Pt takes 4 tablets after every meal)  . Tiotropium Bromide Monohydrate (SPIRIVA  RESPIMAT) 2.5 MCG/ACT AERS Inhale 2.5 mcg into the lungs 2 (two) times daily. (Patient not taking: Reported on 01/25/2018)  . Tiotropium Bromide Monohydrate (SPIRIVA RESPIMAT) 2.5 MCG/ACT AERS Inhale 2 puffs into the lungs daily.   No facility-administered encounter medications on file as of 01/25/2018.      Review of Systems  Review of Systems  Constitutional: Positive for activity change (cant do as much ), appetite change (lacking appetite ) and fatigue. Negative for fever.  HENT: Negative for congestion and postnasal drip.   Eyes: Positive for pain (1 day of left eye pain). Negative for photophobia, discharge, redness, itching and visual disturbance.  Respiratory: Positive for cough (coughing at night, dry cough ), shortness of breath and wheezing.   Cardiovascular: Positive for chest pain (when coughing). Negative for palpitations.  Gastrointestinal: Positive for nausea. Negative for diarrhea and vomiting.       Denies gerd  Genitourinary:       TThS HD Anuric   Musculoskeletal: Positive for gait problem (Walks with cane, in wheelchair).  Neurological: Positive for weakness.    Physical  Exam  BP (!) 108/54 (BP Location: Left Arm, Cuff Size: Normal)   Pulse 71   Ht 5\' 6"  (1.676 m)   Wt 279 lb 9.6 oz (126.8 kg)   SpO2 99%   BMI 45.13 kg/m   Wt Readings from Last 5 Encounters:  01/25/18 279 lb 9.6 oz (126.8 kg)  01/24/18 274 lb 4.8 oz (124.4 kg)  10/08/17 272 lb (123.4 kg)  08/24/17 272 lb (123.4 kg)  05/07/17 272 lb (123.4 kg)    Physical Exam  Constitutional: She is oriented to person, place, and time and well-developed, well-nourished, and in no distress. No distress.  HENT:  Head: Normocephalic and atraumatic.  Right Ear: Hearing, tympanic membrane, external ear and ear canal normal.  Left Ear: Hearing, tympanic membrane, external ear and ear canal normal.  Nose: Nose normal. Right sinus exhibits no maxillary sinus tenderness and no frontal sinus tenderness. Left sinus exhibits no maxillary sinus tenderness and no frontal sinus tenderness.  Mouth/Throat: Uvula is midline and oropharynx is clear and moist. No oropharyngeal exudate.  Eyes: Pupils are equal, round, and reactive to light. EOM and lids are normal. Right eye exhibits no discharge, no exudate and no hordeolum. No foreign body present in the right eye. Left eye exhibits no discharge, no exudate and no hordeolum. No foreign body present in the left eye.  Neck: Normal range of motion. Neck supple.  Cardiovascular: Normal rate, regular rhythm and normal heart sounds.  Pulmonary/Chest: Effort normal and breath sounds normal. No accessory muscle usage. No respiratory distress. She has no decreased breath sounds. She has no wheezes. She has no rhonchi.  Occasional dry cough with deep breaths  Abdominal: Soft. Bowel sounds are normal. There is no abdominal tenderness.  Musculoskeletal: Normal range of motion.        General: Edema (1+ lower extremity edema bilaterally) present.  Lymphadenopathy:    She has no cervical adenopathy.  Neurological: She is alert and oriented to person, place, and time. Gait normal.   Skin: Skin is warm and dry. She is not diaphoretic. No erythema.     Psychiatric: Mood, memory, affect and judgment normal.  Nursing note and vitals reviewed.     Lab Results:  CBC    Component Value Date/Time   WBC 13.5 (H) 01/24/2018 0632   RBC 3.85 (L) 01/24/2018 0632   HGB 11.5 (L) 01/24/2018 1610   HCT  35.1 (L) 01/24/2018 0632   PLT 233 01/24/2018 0632   MCV 91.2 01/24/2018 0632   MCH 29.9 01/24/2018 0632   MCHC 32.8 01/24/2018 0632   RDW 16.9 (H) 01/24/2018 0632   LYMPHSABS 4.5 (H) 01/24/2018 0632   MONOABS 1.3 (H) 01/24/2018 0632   EOSABS 0.2 01/24/2018 0632   BASOSABS 0.0 01/24/2018 0632    BMET    Component Value Date/Time   NA 135 01/24/2018 0632   K 4.1 01/24/2018 0632   CL 93 (L) 01/24/2018 0632   CO2 26 01/24/2018 0632   GLUCOSE 162 (H) 01/24/2018 0632   BUN 54 (H) 01/24/2018 0632   CREATININE 6.55 (H) 01/24/2018 0632   CREATININE 2.86 (H) 10/10/2010 1140   CALCIUM 8.4 (L) 01/24/2018 0632   CALCIUM 8.7 01/08/2010 1121   GFRNONAA 6 (L) 01/24/2018 0632   GFRAA 7 (L) 01/24/2018 0632    BNP    Component Value Date/Time   BNP 118.7 (H) 01/16/2018 1825    ProBNP    Component Value Date/Time   PROBNP 2632.0 (H) 07/02/2010 2056      Assessment & Plan:     COPD exacerbation ([place]) Assessment: -Lungs clear to auscultation but diminished throughout exam -No wheezing -Patient still fatigued -Patient just discharged with COPD exacerbation on 01/24/2018 -Patient received prednisone 40 mg for 5 days as well as azithromycin -Patient with occasional dry cough that is worse at night, no productive sputum -Patient has started Symbicort 160 but has not started Spiriva yet  Plan: -October/2018 -pulmonary function test does not show obstruction, reduction in DLCO -mild centrilobular emphysema in September/2018 CT chest -Continue Symbicort 160 -Start Spiriva 2.5, patient coached on inhaler technique today >>> Patient has failed Spiriva HandiHaler,  patient struggles with peak inspiratory flow as well as management of -HandiHaler device and pill capsule -Consider pulmonary function testing in 1-2 months when patient is more stable -Can use over-the-counter cough medicine such as Delsym for management of cough   OSA  Assessment: Mild obstructive sleep apnea Patient has not used CPAP since February/2019, patient is not interested in using CPAP Per February/2019 office note we were okay with patient not being managed with CPAP  Plan: Okay to continue to monitor for symptoms Follow-up in 4 weeks  Acute bronchitis due to Rhinovirus Assessment: Resolving bronchitis due to rhinovirus  Plan: Continue with triple therapy on inhalers, start Spiriva Respimat Follow-up in 4 weeks  ESRD needing dialysis [facility]) Continue Tuesday Thursday Saturday HD Continue follow-up with Dr. Florene [name] Schedule follow-up as you are just discharged from the hospital  Eye pain, left Assessment: 1 day of increased left eye pain No vision changes Patient reports the pain comes and goes  Plan: Patient to follow-up with eye doctor regarding pain Vision or pain worsens to present to the emergency room  Chronic diastolic CHF (congestive heart failure) ([place]) Plan: Schedule follow-up with primary care Schedule follow-up with cardiology Continue to weigh yourself daily     Lauraine Rinne, NP 01/25/2018   This appointment was 42 minutes long with over 50% of the time in direct face-to-face patient care, assessment, plan of care, and follow-up.

## 2018-01-25 NOTE — Assessment & Plan Note (Signed)
Continue Tuesday Thursday Saturday HD Continue follow-up with Dr. Florene Glen Schedule follow-up as you are just discharged from the hospital

## 2018-01-25 NOTE — Assessment & Plan Note (Signed)
Assessment: Mild obstructive sleep apnea Patient has not used CPAP since February/2019, patient is not interested in using CPAP Per February/2019 office note we were okay with patient not being managed with CPAP  Plan: Okay to continue to monitor for symptoms Follow-up in 4 weeks

## 2018-01-25 NOTE — Assessment & Plan Note (Signed)
Assessment: Resolving bronchitis due to rhinovirus  Plan: Continue with triple therapy on inhalers, start Spiriva Respimat Follow-up in 4 weeks

## 2018-01-26 ENCOUNTER — Emergency Department (HOSPITAL_COMMUNITY): Payer: Medicare HMO

## 2018-01-26 DIAGNOSIS — J439 Emphysema, unspecified: Secondary | ICD-10-CM | POA: Diagnosis not present

## 2018-01-26 DIAGNOSIS — N2581 Secondary hyperparathyroidism of renal origin: Secondary | ICD-10-CM | POA: Diagnosis not present

## 2018-01-26 DIAGNOSIS — N186 End stage renal disease: Secondary | ICD-10-CM | POA: Diagnosis not present

## 2018-01-26 MED ORDER — PREDNISONE 20 MG PO TABS: ORAL_TABLET | ORAL | 0 refills | Status: DC

## 2018-01-26 MED ORDER — IPRATROPIUM-ALBUTEROL 0.5-2.5 (3) MG/3ML IN SOLN
3.0000 mL | Freq: Once | RESPIRATORY_TRACT | Status: AC
  Administered 2018-01-26: 3 mL via RESPIRATORY_TRACT
  Filled 2018-01-26: qty 3

## 2018-01-26 MED ORDER — PREDNISONE 20 MG PO TABS
60.0000 mg | ORAL_TABLET | Freq: Once | ORAL | Status: AC
  Administered 2018-01-26: 60 mg via ORAL
  Filled 2018-01-26: qty 3

## 2018-01-26 NOTE — ED Notes (Signed)
While walking pt's 02 went down to 89% then went back up to 95% upon returning to bed

## 2018-01-26 NOTE — Discharge Instructions (Addendum)
Continue to use your albuterol nebulizer as needed.  Return if symptoms are not being adequately controlled at home.  Go for your dialysis today.

## 2018-01-26 NOTE — ED Notes (Signed)
Pt slightly hypotensive. MD aware

## 2018-01-26 NOTE — ED Notes (Signed)
Patient verbalizes understanding of discharge instructions. Opportunity for questioning and answers were provided. Armband removed by staff, pt discharged from ED.  

## 2018-01-26 NOTE — ED Provider Notes (Addendum)
[facility] EMERGENCY DEPARTMENT Provider Note   CSN: [id number] Arrival date & time: 01/25/18  1735     History   Chief Complaint Chief Complaint  Patient presents with  . Cough  . Abnormal Lab    HPI [name] is a 73 y.o. female.  The history is provided by the patient.  She has history of hypertension, COPD, end-stage renal disease on hemodialysis, diastolic heart failure and was discharged from the hospital on January 5.  She had been admitted for shortness of breath but states that she was not feeling any better on discharge.  She continues to have a dry cough.  She denies fever or chills and denies any chest pain.  She denies any vomiting or diarrhea.  She feels generally weak.  She did speak with her primary care provider today who advised that she should come back to the ED because she was anemic.  Past Medical History:  Diagnosis Date  . Abdominal abscess 12-17-10   abdominal abscesses x2 ? one at this time  . Anemia   . Arthritis    "all over" (08/23/2012)  . Blind right eye    fell and crushed socket and eye fell out, was replaced at [facility]  . CHF (congestive heart failure) ([place])   . Chronic lower back pain   . Coronary artery disease    normal coronaries by 08/25/12 cath  . Dyspnea   . Dysrhythmia    irregular, "skips beats"  . ESRD (end stage renal disease) on dialysis ([place])    "started 12/2011; [facility]; TTS" (08/23/2012  . Exertional shortness of breath   . Eye drainage    "lots; since fall 07/2011" (08/23/2012)  . Gout   . H/O hiatal hernia   . Headache(784.0)    "weekly sometimes; since I fell and hit my head in 07/2011 08/23/2012)  . Heart murmur   . History of blood transfusion    "lots before starting dialysis" (08/23/2012)  . Hypertension    sees Dr. Willey Blade  . Inhalation injury    "worked at [employer]; can't inhale polyurethane or paint, etc" (08/23/2012)  . Myocardial infarction ([place]) 1970's or 1980's  .  Neuromuscular disorder ([place])    diabetic neuropathy  . OSA on CPAP   . Staph aureus infection November 2012   . Swelling of both ankles 12-17-10  . Type II diabetes mellitus ([place])    "over 10 years now" (08/23/2012)    Patient Active Problem List   Diagnosis Date Noted  . Eye pain, left 01/25/2018  . Acute bronchitis due to Rhinovirus 01/18/2018  . Respiratory distress, acute 01/18/2018  . Essential hypertension 01/17/2018  . HLD (hyperlipidemia) 01/17/2018  . Chronic diastolic CHF (congestive heart failure) ([place]) 01/16/2018  . Hypotension 01/16/2018  . COPD exacerbation ([place]) 01/16/2018  . Acute bronchitis 01/16/2018  . Heme + stool 08/24/2017  . Iron deficiency anemia, unspecified 08/24/2017  . Chest pain 04/06/2017  . Unstable angina ([place]) 04/02/2017  . Hypoxemia 10/13/2016  . Acute on chronic combined systolic and diastolic CHF (congestive heart failure) ([place]) 10/13/2016  . Anemia due to chronic kidney disease 10/13/2016  . Acute left systolic heart failure ([place]) 01/30/2016  . Encephalopathy, metabolic   . Tremor   . Weakness   . Acute hyperkalemia 12/04/2014  . Neurological deficit present 12/04/2014  . Hypercalcemia 12/04/2014  . OSA    . Myocardial infarction ([place])   . Blind right eye   . Coronary  artery disease   . CHF (congestive heart failure) ([place])   . ESRD needing dialysis ([place])   . Hyperkalemia 08/05/2014  . Type II diabetes mellitus with renal manifestations ([place]) 01/18/2014  . Arm pain 02/27/2012  . Other complications due to renal dialysis device, implant, and graft 02/13/2012  . Pain in limb 11/21/2011  . Swollen limb 11/21/2011  . Itching 11/21/2011  . End stage renal disease ([place]) 11/07/2011  . Chronic kidney disease, stage IV (severe) ([place]) 10/24/2011  . Chronic kidney disease (CKD), stage IV (severe) ([place]) 08/29/2011    Past Surgical History:  Procedure Laterality Date  . [place] VITRECTOMY WITH 20 GAUGE MVR PORT Left 05/07/2017    Procedure: 25 GAUGE PARS PLANA VITRECTOMY WITH ENDOLASER LEFT EYE;  Surgeon: Bernarda Caffey, MD;  Location: [facility];  Service: Ophthalmology;  Laterality: Left;  . APPENDECTOMY    . AV FISTULA PLACEMENT  09/30/2011   Procedure: ARTERIOVENOUS (AV) FISTULA CREATION;  Surgeon: Conrad Gibbon, MD;  Location: [facility];  Service: Vascular;  Laterality: Left;  BRACHIAL-CEPHALIC  . BACK SURGERY    . BASCILIC VEIN TRANSPOSITION Left 11/10/2011   Left arm   . [place] TRANSPOSITION  01/28/2012   Procedure: BASCILIC VEIN TRANSPOSITION;  Surgeon: Conrad Fairway, MD;  Location: [facility];  Service: Vascular;  Laterality: Left;  Second Stage   . BIOPSY  08/24/2017   Procedure: BIOPSY;  Surgeon: Wilford Corner, MD;  Location: [facility];  Service: Endoscopy;;  . CARDIAC CATHETERIZATION  1980's  . CATARACT EXTRACTION W/ INTRAOCULAR LENS IMPLANT Bilateral 1990's  . COLONOSCOPY WITH PROPOFOL N/A 08/24/2017   Procedure: COLONOSCOPY WITH PROPOFOL;  Surgeon: Wilford Corner, MD;  Location: [facility];  Service: Endoscopy;  Laterality: N/A;  . CORONARY ANGIOGRAPHY N/A 04/07/2017   Procedure: CORONARY ANGIOGRAPHY (CATH LAB);  Surgeon: Dixie Dials, MD;  Location: [facility] CV LAB;  Service: Cardiovascular;  Laterality: N/A;  . DILATION AND CURETTAGE OF UTERUS  1960's  . ESOPHAGOGASTRODUODENOSCOPY (EGD) WITH PROPOFOL N/A 08/24/2017   Procedure: ESOPHAGOGASTRODUODENOSCOPY (EGD) WITH PROPOFOL;  Surgeon: Wilford Corner, MD;  [facility];  Service: Endoscopy;  Laterality: N/A;  . EYE SURGERY    . FISTULA SUPERFICIALIZATION Left 02/18/2017   Procedure: SUPERFICIALIZATION LEFT ARM ARTERIOVENOUS FISTULA;  Surgeon: Serafina Mitchell, MD;  Location: [facility];  Service: Vascular;  Laterality: Left;  . HERNIA REPAIR    . INSERTION OF DIALYSIS CATHETER  01/05/2012   Procedure: INSERTION OF DIALYSIS CATHETER;  Surgeon: Conrad , MD;  Location: [facility];  Service: Vascular;  Laterality: N/A;  Right Internal Jugular Placement    . INTRAOCULAR PROSTHESES INSERTION Right 2013   "fell; knocked my eye outl" (08/23/2012)  . JOINT REPLACEMENT    . LAPAROTOMY  12/18/2010   Procedure: EXPLORATORY LAPAROTOMY;  Surgeon: Rolm Bookbinder, MD;  Location: [facility];  Service: General;  Laterality: N/A;  Abdominal Seroma Evacuation  . LEFT HEART CATHETERIZATION WITH CORONARY ANGIOGRAM N/A 08/25/2012   Procedure: LEFT HEART CATHETERIZATION WITH CORONARY ANGIOGRAM;  Surgeon: Birdie Riddle, MD;  Location: [facility] CATH LAB;  Service: Cardiovascular;  Laterality: N/A;  . LUMBAR DISC SURGERY  1970'-80's   X 3  . TOTAL KNEE ARTHROPLASTY Left 1980's  . VAGINAL HYSTERECTOMY  1970's  . VENTRAL HERNIA REPAIR  2012-2013   component separation, repair with biologic; "had 3 surgeries to fix it" (08/23/2012)  . WOUND DEBRIDEMENT  03/20/2011   Procedure: DEBRIDEMENT ABDOMINAL WOUND;  Surgeon: Rolm Bookbinder, MD;  [facility]  OR;  Service: General;  Laterality: N/A;  debridement abdominal wall, placement of wound vac     OB History   No obstetric history on file.      Home Medications    Prior to Admission medications   Medication Sig Start Date End Date Taking? Authorizing Provider  acetaminophen (TYLENOL) 325 MG tablet Take 2 tablets (650 mg total) by mouth every 6 (six) hours as needed for fever, headache or moderate pain. 01/24/18   Sheikh, Georgina Quint Latif, DO  albuterol (PROVENTIL) (2.5 MG/3ML) 0.083% nebulizer solution Take 3 mLs (2.5 mg total) by nebulization every 2 (two) hours as needed for shortness of breath. 10/21/16   Elwin Mocha, MD  aspirin EC 81 MG tablet Take 81 mg by mouth daily.      [provider]  atorvastatin (LIPITOR) 40 MG tablet Take 40 mg by mouth daily.  04/26/17   [provider]  benzonatate (TESSALON) 200 MG capsule Take 1 capsule (200 mg total) by mouth 3 (three) times daily as needed for cough. 01/24/18   Sheikh, Omair Latif, DO  budesonide-formoterol (SYMBICORT) 160-4.5 MCG/ACT inhaler Inhale 2 puffs  into the lungs 2 (two) times daily. 01/24/18   Raiford Noble Latif, DO  cinacalcet (SENSIPAR) 90 MG tablet Take 90 mg by mouth daily.     [provider]  diphenhydrAMINE (BENADRYL) 25 mg capsule Take 50 mg by mouth See admin instructions. Take 2 tablets (50 mg) by mouth prior to dialysis on Tuesday, Thursday, Saturday - for itching    [provider]  ethyl chloride spray Apply 1 application topically See admin instructions. Apply topically before dialysis on Tuesday, Thursday, Saturday 01/10/16   [provider]  guaiFENesin (MUCINEX) 600 MG 12 hr tablet Take 1 tablet (600 mg total) by mouth 2 (two) times daily. 01/24/18   Sheikh, Omair Latif, DO  insulin glargine (LANTUS) 100 UNIT/ML injection Inject 0.05 mLs (5 Units total) into the skin at bedtime. Patient taking differently: Inject 6 Units into the skin at bedtime.  04/09/17   Dixie Dials, MD  midodrine (PROAMATINE) 10 MG tablet Take 10 mg by mouth See admin instructions. Take 10mg  on Tues, Thurs, and Saturday before dialysis 04/26/17   [provider]  Multiple Vitamin (MULTIVITAMIN WITH MINERALS) TABS tablet Take 1 tablet by mouth daily.    [provider]  oxyCODONE-acetaminophen (PERCOCET/ROXICET) 5-325 MG tablet Take 1 tablet by mouth 4 (four) times daily as needed for pain. 08/03/17   [provider]  sevelamer carbonate (RENVELA) 800 MG tablet Take 2-3 tablets (1,600-2,400 mg total) by mouth See admin instructions. Take 3 tablets (2,400 mg) by mouth 3 times daily with meals. Take 2 tablets (1,600 mg) by mouth with snacks. Receives supply from dialysis center. Patient taking differently: Take 1,600-2,400 mg by mouth See admin instructions. Pt takes 4 tablets after every meal 04/09/17   Dixie Dials, MD  Tiotropium Bromide Monohydrate (SPIRIVA RESPIMAT) 2.5 MCG/ACT AERS Inhale 2.5 mcg into the lungs 2 (two) times daily. Patient not taking: Reported on 01/25/2018 01/24/18   Raiford Noble Latif, DO    Tiotropium Bromide Monohydrate (SPIRIVA RESPIMAT) 2.5 MCG/ACT AERS Inhale 2 puffs into the lungs daily. 01/25/18   Tanda Rockers, MD    Family History Family History  Problem Relation Age of Onset  . Hypertension Mother   . Cancer Brother        spine  . Hypertension Father   . Cancer Sister  brain  . Hypertension Maternal Grandmother   . Anesthesia problems Neg Hx     Social History Social History   Tobacco Use  . Smoking status: Former Smoker    Packs/day: 0.50    Years: 20.00    Pack years: 10.00    Types: Cigarettes    Last attempt to quit: 12/16/1980    Years since quitting: 37.1  . Smokeless tobacco: Never Used  Substance Use Topics  . Alcohol use: No  . Drug use: No     Allergies   Lyrica [pregabalin]   Review of Systems Review of Systems  All other systems reviewed and are negative.    Physical Exam Updated Vital Signs BP (!) 141/60 (BP Location: Right Arm)   Pulse 66   Temp 97.8 F (36.6 C) (Oral)   Resp (!) 24   SpO2 99%   Physical Exam Vitals signs and nursing note reviewed.    Morbidly obese 73 year old female, resting comfortably and in no acute distress. Vital signs are significant for borderline elevated heart rate elevated respiratory rate. Oxygen saturation is 99%, which is normal. Head is normocephalic and atraumatic. PERRLA, EOMI. Oropharynx is clear. Neck is nontender and supple without adenopathy or JVD. Back is nontender and there is no CVA tenderness. Lungs have expiratory rhonchi with some wheezing noted on forced exhalation.  No rales are appreciated. Chest is nontender. Heart has regular rate and rhythm without murmur. Abdomen is soft, flat, nontender without masses or hepatosplenomegaly and peristalsis is normoactive. Extremities have trace edema, full range of motion is present.  AV fistula is present left upper arm with thrill present. Skin is warm and dry without rash. Neurologic: Mental status is normal, cranial  nerves are intact, there are no motor or sensory deficits.  ED Treatments / Results  Labs (all labs ordered are listed, but only abnormal results are displayed) Labs Reviewed  BASIC METABOLIC PANEL - Abnormal; Notable for the following components:      Result Value   Glucose, Bld 122 (*)    BUN 78 (*)    Creatinine, Ser 9.85 (*)    Calcium 8.2 (*)    GFR calc non Af Amer 4 (*)    GFR calc Af Amer 4 (*)    All other components within normal limits  CBC - Abnormal; Notable for the following components:   WBC 10.6 (*)    RBC 3.66 (*)    Hemoglobin 10.7 (*)    HCT 33.3 (*)    RDW 17.4 (*)    nRBC 0.4 (*)    All other components within normal limits  I-STAT TROPONIN, ED    EKG EKG Interpretation  Date/Time:  Monday January 25 2018 17:51:50 EST Ventricular Rate:  76 PR Interval:  178 QRS Duration: 86 QT Interval:  430 QTC Calculation: 483 R Axis:   -62 Text Interpretation:  Normal sinus rhythm Left anterior fascicular block Abnormal ECG When compared with ECG of 01/16/2018, Premature atrial complexes are no longer present Confirmed by Delora Fuel ([id number]) on 01/25/2018 11:15:31 PM   Radiology Dg Chest 2 View  Result Date: 01/25/2018 CLINICAL DATA:  Cough EXAM: CHEST - 2 VIEW COMPARISON:  January 23, 2018 and March 23, 2017 FINDINGS: There is mild bibasilar interstitial prominence which is chronic and stable. There is focal opacity overlying the left heart measuring 2.9 x 2.6 cm. There is no frank edema or consolidation. Heart size and pulmonary vascularity are normal. There is aortic atherosclerosis. No adenopathy.  No evident bone lesions. IMPRESSION: 2.9 x 2.6 cm opacity overlying the left heart. A nodular lesion in this area must be of concern. Advise correlation with noncontrast enhanced chest CT to further assess. Chronic interstitial thickening in the bases. No frank edema or consolidation. Stable cardiac silhouette. There is aortic atherosclerosis. Aortic Atherosclerosis  (ICD10-I70.0). Electronically Signed   By: Lowella Grip III M.D.   On: 01/25/2018 18:48   Ct Chest Wo Contrast  Result Date: 01/26/2018 CLINICAL DATA:  Mediastinal mass, initial workup. Persistent cough. Questionable lung nodule on radiograph. EXAM: CT CHEST WITHOUT CONTRAST TECHNIQUE: Multidetector CT imaging of the chest was performed following the standard protocol without IV contrast. COMPARISON:  Chest radiograph yesterday. Chest CT 10/16/2016 FINDINGS: Cardiovascular: Aortic atherosclerosis and tortuosity. Branch vessels are tortuous. Mild multi chamber cardiomegaly. There mitral annulus calcifications. No pericardial effusion. Mediastinum/Nodes: Thyromegaly with intrathoracic extension of left thyroid gland, unchanged from 10/16/2016 chest CT. Otherwise no mediastinal mass. No mediastinal adenopathy. No evidence of hilar adenopathy. Patulous esophagus with intraluminal fluid. Small hiatal hernia. No esophageal wall thickening. Lungs/Pleura: Mild apical predominant emphysema. No suspicious pulmonary nodule or mass. Punctate nodule in the right middle lobe unchanged and considered benign. No focal airspace disease. No pulmonary edema or pleural effusion. Trachea and mainstem bronchi are patent. Upper Abdomen: Hepatosplenomegaly. Atherosclerosis of upper abdominal vasculature. Left renal atrophy is partially included. No acute findings. Musculoskeletal: Degenerative change in the spine and right shoulder. There are no acute or suspicious osseous abnormalities. IMPRESSION: 1. No mediastinal or suspicious pulmonary mass. Radiographic findings likely secondary to mitral annulus calcifications or small hiatal hernia. 2. No acute chest finding. 3. Left thyromegaly with intrathoracic extension, unchanged from prior exam. 4. Aortic Atherosclerosis (ICD10-I70.0) and Emphysema (ICD10-J43.9). Electronically Signed   By: Keith Rake M.D.   On: 01/26/2018 03:56    Procedures Procedures  Medications  Ordered in ED Medications  predniSONE (DELTASONE) tablet 60 mg (60 mg Oral Given 01/26/18 0226)  ipratropium-albuterol (DUONEB) 0.5-2.5 (3) MG/3ML nebulizer solution 3 mL (3 mLs Nebulization Given 01/26/18 0226)     Initial Impression / Assessment and Plan / ED Course  I have reviewed the triage vital signs and the nursing notes.  Pertinent labs & imaging results that were available during my care of the patient were reviewed by me and considered in my medical decision making (see chart for details).  Persistent cough and dyspnea with COPD exacerbation.  Old records reviewed confirming hospitalization with discharge on 1/5.  Steroids have been discontinued while in the hospital, will reinstitute prednisone.  She will be given nebulizer treatment with albuterol and ipratropium.  Labs do show chronic renal failure and anemia which is mild and at her baseline.  Chest x-ray is reported to show nodular density overlying the heart with recommendation for elective CT scan.  Last CT of the chest on record was September 2018.  CT scan will be obtained here.  CT scan shows no mass and also no occult pneumonia.  She was feeling better following nebulizer treatment.  She was ambulated in the emergency department with no significant oxygen desaturation.  She did have an episode of transient hypotension which resolved with no treatment, and she has been known to have hypotension in the past.  She is discharged with prescription for prednisone and is told to go for her scheduled dialysis today.  Follow-up with her PCP before the course of prednisone is completed so that he can arrange an appropriate taper.  Return precautions discussed.  Final  Clinical Impressions(s) / ED Diagnoses   Final diagnoses:  Cough  End-stage renal disease on hemodialysis ([place])  Anemia associated with chronic renal failure    ED Discharge Orders         Ordered    predniSONE (DELTASONE) 20 MG tablet     01/26/18 4469             Delora Fuel, MD 50/72/25 [phone]  Patient was concerned that she missed her dialysis session which was to start at 6 AM.  I have discussed the case with Dr. Marval Regal, on-call for nephrology.  He has contacted the patient's dialysis center and they will take her at 11:30 AM today.   Delora Fuel, MD 18/33/58 [phone]

## 2018-01-28 DIAGNOSIS — R6889 Other general symptoms and signs: Secondary | ICD-10-CM | POA: Diagnosis not present

## 2018-01-28 DIAGNOSIS — N186 End stage renal disease: Secondary | ICD-10-CM | POA: Diagnosis not present

## 2018-01-28 DIAGNOSIS — N2581 Secondary hyperparathyroidism of renal origin: Secondary | ICD-10-CM | POA: Diagnosis not present

## 2018-01-28 LAB — CULTURE, BLOOD (ROUTINE X 2)
Culture: NO GROWTH
Culture: NO GROWTH
Special Requests: ADEQUATE
Special Requests: ADEQUATE

## 2018-01-29 DIAGNOSIS — I509 Heart failure, unspecified: Secondary | ICD-10-CM | POA: Diagnosis not present

## 2018-01-29 DIAGNOSIS — J441 Chronic obstructive pulmonary disease with (acute) exacerbation: Secondary | ICD-10-CM | POA: Diagnosis not present

## 2018-01-29 DIAGNOSIS — Z992 Dependence on renal dialysis: Secondary | ICD-10-CM | POA: Diagnosis not present

## 2018-01-29 DIAGNOSIS — E871 Hypo-osmolality and hyponatremia: Secondary | ICD-10-CM | POA: Diagnosis not present

## 2018-01-29 DIAGNOSIS — R531 Weakness: Secondary | ICD-10-CM | POA: Diagnosis not present

## 2018-01-29 DIAGNOSIS — I10 Essential (primary) hypertension: Secondary | ICD-10-CM | POA: Diagnosis not present

## 2018-01-29 DIAGNOSIS — M810 Age-related osteoporosis without current pathological fracture: Secondary | ICD-10-CM | POA: Diagnosis not present

## 2018-01-29 DIAGNOSIS — E211 Secondary hyperparathyroidism, not elsewhere classified: Secondary | ICD-10-CM | POA: Diagnosis not present

## 2018-01-30 DIAGNOSIS — R6889 Other general symptoms and signs: Secondary | ICD-10-CM | POA: Diagnosis not present

## 2018-01-30 DIAGNOSIS — N2581 Secondary hyperparathyroidism of renal origin: Secondary | ICD-10-CM | POA: Diagnosis not present

## 2018-01-30 DIAGNOSIS — N186 End stage renal disease: Secondary | ICD-10-CM | POA: Diagnosis not present

## 2018-02-02 DIAGNOSIS — N2581 Secondary hyperparathyroidism of renal origin: Secondary | ICD-10-CM | POA: Diagnosis not present

## 2018-02-02 DIAGNOSIS — N186 End stage renal disease: Secondary | ICD-10-CM | POA: Diagnosis not present

## 2018-02-02 DIAGNOSIS — R6889 Other general symptoms and signs: Secondary | ICD-10-CM | POA: Diagnosis not present

## 2018-02-03 ENCOUNTER — Encounter: Payer: Self-pay | Admitting: Pulmonary Disease

## 2018-02-03 ENCOUNTER — Ambulatory Visit (INDEPENDENT_AMBULATORY_CARE_PROVIDER_SITE_OTHER): Payer: Medicare HMO | Admitting: Pulmonary Disease

## 2018-02-03 DIAGNOSIS — Z992 Dependence on renal dialysis: Secondary | ICD-10-CM

## 2018-02-03 DIAGNOSIS — Z23 Encounter for immunization: Secondary | ICD-10-CM | POA: Diagnosis not present

## 2018-02-03 DIAGNOSIS — N186 End stage renal disease: Secondary | ICD-10-CM

## 2018-02-03 DIAGNOSIS — Z9989 Dependence on other enabling machines and devices: Secondary | ICD-10-CM

## 2018-02-03 DIAGNOSIS — G4733 Obstructive sleep apnea (adult) (pediatric): Secondary | ICD-10-CM | POA: Diagnosis not present

## 2018-02-03 DIAGNOSIS — J432 Centrilobular emphysema: Secondary | ICD-10-CM | POA: Diagnosis not present

## 2018-02-03 DIAGNOSIS — J206 Acute bronchitis due to rhinovirus: Secondary | ICD-10-CM

## 2018-02-03 MED ORDER — TIOTROPIUM BROMIDE MONOHYDRATE 2.5 MCG/ACT IN AERS: 2.0000 | INHALATION_SPRAY | Freq: Every day | RESPIRATORY_TRACT | 3 refills | Status: DC

## 2018-02-03 MED ORDER — TIOTROPIUM BROMIDE MONOHYDRATE 2.5 MCG/ACT IN AERS: 2.0000 | INHALATION_SPRAY | Freq: Every day | RESPIRATORY_TRACT | 0 refills | Status: DC

## 2018-02-03 MED ORDER — BUDESONIDE-FORMOTEROL FUMARATE 160-4.5 MCG/ACT IN AERO: 2.0000 | INHALATION_SPRAY | Freq: Two times a day (BID) | RESPIRATORY_TRACT | 0 refills | Status: DC

## 2018-02-03 NOTE — Assessment & Plan Note (Signed)
Assessment: Lungs clear to auscultation Patient reports significant improvement with cough, fatigue, wheezing Patient reports adherence to Spiriva 2.5 and Symbicort 160  Plan: Continue follow-up with our office in 6 weeks Continue Spiriva 2.5 Continue Symbicort 160

## 2018-02-03 NOTE — Assessment & Plan Note (Addendum)
Plan: Continue Tuesday Thursday Saturday dialysis Continue follow-up with Dr. Florene Glen High-dose flu vaccine today

## 2018-02-03 NOTE — Assessment & Plan Note (Signed)
Plan: Continue to monitor for fatigue and daytime sleepiness Continue to hold CPAP at patient's request

## 2018-02-03 NOTE — Progress Notes (Signed)
@Patient  ID: [name], female    DOB: May 26, 1945, 73 y.o.   MRN: [id number]  Chief Complaint  Patient presents with  . Hospitalization Follow-up    ED visit for cough    Referring provider: [name], MD  HPI:  73 year old female former smoker followed in our office for mild obstructive sleep apnea not currently managed on CPAP (based off of February/2019 office note), emphysema  PMH: End-stage renal disease, Tuesday Thursday Saturday dialysis-managed by Dr. Florene Glen, congestive heart failure, obesity, T2DM Smoker/ Smoking History: Former smoker.  4 pack years. Maintenance: Symbicort 160, Spiriva 2.5 Pt of: Dr. Elsworth Soho  02/03/2018  - Visit   73 year old female former smoker (4-pack-year smoking history) followed in our office for mild obstructive sleep apnea, emphysema.  Patient presenting to office today for an ED follow-up.  Patient was seen in the ED CT scan was performed.  Showing no acute abnormalities.  Patient was prescribed prednisone.  Patient remains adherent to Symbicort 160 and Spiriva 2.5.  Unfortunately the patient has been taking Spiriva 2.5 twice daily instead of daily.  We will clarify this today.  Patient reports that cough, wheezing, fatigue have all improved.  Patient feels that she is on the mend from the bronchitis that hospitalized her.  Patient remains adherent to Tuesday Thursday Saturday dialysis.   Tests:   01/26/2018-CT chest without contrast-no mediastinal or suspected pulmonary mass, radiographic findings likely secondary to mitral annulus calcifications or small hiatal hernia, no acute chest finding, left thyromegaly, unchanged from prior exam,Emphysema, aortic arthrosclerosis  01/23/2018-chest x-ray-cardiomegaly and aortic arthrosclerosis, question pulmonary venous hypertension without frank edema, mild basilar atelectasis  10/16/2016-CT chest without contrast- mild cardiomegaly, mild centrilobular emphysema, 2 mm right lower lobe pulmonary nodule,  mild apical bullous changes  10/17/2016-VQ scan-low probability of pulmonary embolus  12/04/2014-CT head- clear paranasal sinuses and mastoid air cells  04/06/2017-echocardiogram-LV ejection fraction 60 to 52%, grade 1 diastolic dysfunction, mild concentric hypertrophy, main pulmonary artery was normal-sized  12/25/2016-split-night sleep study-mild obstructive sleep apnea-AHI 7.8, SaO2 low 92%, average SaO2 95%  10/27/2016-pulmonary function test-FVC 2.09 (87% predicted), postbronchodilator ratio 90, FEV1 98, DLCO 52  FENO:  No results found for: NITRICOXIDE  PFT: PFT Results Latest Ref Rng & Units 10/27/2016  FVC-Pre L 2.09  FVC-Predicted Pre % 87  FVC-Post L 2.03  FVC-Predicted Post % 84  Pre FEV1/FVC % % 88  Post FEV1/FCV % % 90  FEV1-Pre L 1.85  FEV1-Predicted Pre % 99  FEV1-Post L 1.82  DLCO UNC% % 52  DLCO COR %Predicted % 92  TLC L 4.17  TLC % Predicted % 80  RV % Predicted % 78    Imaging: Dg Chest 2 View  Result Date: 01/25/2018 CLINICAL DATA:  Cough EXAM: CHEST - 2 VIEW COMPARISON:  January 23, 2018 and March 23, 2017 FINDINGS: There is mild bibasilar interstitial prominence which is chronic and stable. There is focal opacity overlying the left heart measuring 2.9 x 2.6 cm. There is no frank edema or consolidation. Heart size and pulmonary vascularity are normal. There is aortic atherosclerosis. No adenopathy. No evident bone lesions. IMPRESSION: 2.9 x 2.6 cm opacity overlying the left heart. A nodular lesion in this area must be of concern. Advise correlation with noncontrast enhanced chest CT to further assess. Chronic interstitial thickening in the bases. No frank edema or consolidation. Stable cardiac silhouette. There is aortic atherosclerosis. Aortic Atherosclerosis (ICD10-I70.0). Electronically Signed   By: Lowella Grip III M.D.   On: 01/25/2018 18:48  Ct Chest Wo Contrast  Result Date: 01/26/2018 CLINICAL DATA:  Mediastinal mass, initial workup. Persistent  cough. Questionable lung nodule on radiograph. EXAM: CT CHEST WITHOUT CONTRAST TECHNIQUE: Multidetector CT imaging of the chest was performed following the standard protocol without IV contrast. COMPARISON:  Chest radiograph yesterday. Chest CT 10/16/2016 FINDINGS: Cardiovascular: Aortic atherosclerosis and tortuosity. Branch vessels are tortuous. Mild multi chamber cardiomegaly. There mitral annulus calcifications. No pericardial effusion. Mediastinum/Nodes: Thyromegaly with intrathoracic extension of left thyroid gland, unchanged from 10/16/2016 chest CT. Otherwise no mediastinal mass. No mediastinal adenopathy. No evidence of hilar adenopathy. Patulous esophagus with intraluminal fluid. Small hiatal hernia. No esophageal wall thickening. Lungs/Pleura: Mild apical predominant emphysema. No suspicious pulmonary nodule or mass. Punctate nodule in the right middle lobe unchanged and considered benign. No focal airspace disease. No pulmonary edema or pleural effusion. Trachea and mainstem bronchi are patent. Upper Abdomen: Hepatosplenomegaly. Atherosclerosis of upper abdominal vasculature. Left renal atrophy is partially included. No acute findings. Musculoskeletal: Degenerative change in the spine and right shoulder. There are no acute or suspicious osseous abnormalities. IMPRESSION: 1. No mediastinal or suspicious pulmonary mass. Radiographic findings likely secondary to mitral annulus calcifications or small hiatal hernia. 2. No acute chest finding. 3. Left thyromegaly with intrathoracic extension, unchanged from prior exam. 4. Aortic Atherosclerosis (ICD10-I70.0) and Emphysema (ICD10-J43.9). Electronically Signed   By: Keith Rake M.D.   On: 01/26/2018 03:56   Dg Chest Port 1 View  Result Date: 01/23/2018 CLINICAL DATA:  Shortness of breath. EXAM: PORTABLE CHEST 1 VIEW COMPARISON:  01/20/2018 FINDINGS: Chronic cardiomegaly and aortic atherosclerosis. Question pulmonary venous hypertension without frank  edema. Mild bibasilar atelectasis. IMPRESSION: Cardiomegaly and aortic atherosclerosis. Question pulmonary venous hypertension without frank edema. Mild basilar atelectasis. Electronically Signed   By: Nelson Chimes M.D.   On: 01/23/2018 08:14   Dg Chest Port 1 View  Result Date: 01/20/2018 CLINICAL DATA:  Dyspnea. EXAM: PORTABLE CHEST 1 VIEW COMPARISON:  Radiograph of January 16, 2018. FINDINGS: Stable cardiomegaly. Atherosclerosis of thoracic aorta is noted. No pneumothorax or pleural effusion is noted. No acute pulmonary disease is noted. Bony thorax is unremarkable. IMPRESSION: No acute cardiopulmonary abnormality seen. Aortic Atherosclerosis (ICD10-I70.0). Electronically Signed   By: Marijo Conception, M.D.   On: 01/20/2018 13:42   Dg Chest Port 1 View  Result Date: 01/16/2018 CLINICAL DATA:  Fever x1 day. SOB, cough x3 days. Pt states sometimes the cough is productive and other times it is dry. Hx of HTN, DM, CHF. Hx of ESRD, MI. Ex-smoker, quit APPROX.40 years ago. EXAM: PORTABLE CHEST 1 VIEW COMPARISON:  04/02/2017 FINDINGS: The heart is enlarged. There is atherosclerotic calcification of the thoracic aorta. No focal consolidation. No pulmonary edema. Interval removal of dialysis catheter. IMPRESSION: Cardiomegaly. Electronically Signed   By: Nolon Nations M.D.   On: 01/16/2018 19:53      Specialty Problems      Pulmonary Problems   OSA     12/25/2016-split-night sleep study-mild obstructive sleep apnea-AHI 7.8, SaO2 low 92%, average SaO2 95%      Acute bronchitis   Centrilobular emphysema (HCC)    01/26/2018-CT chest without contrast-no mediastinal or suspected pulmonary mass, radiographic findings likely secondary to mitral annulus calcifications or small hiatal hernia, no acute chest finding, left thyromegaly, unchanged from prior exam,Emphysema, aortic arthrosclerosis  10/16/2016-CT chest without contrast- mild cardiomegaly, mild centrilobular emphysema, 2 mm right lower lobe  pulmonary nodule, mild apical bullous changes      Acute bronchitis due to Rhinovirus   Respiratory  distress, acute      Allergies  Allergen Reactions  . Lyrica [Pregabalin] Other (See Comments)    Hallucinations    Immunization History  Administered Date(s) Administered  . Influenza, High Dose Seasonal PF 02/03/2018  . Influenza-Unspecified 11/30/2015  . Pneumococcal-Unspecified 09/21/2011  . Tdap 08/05/2011   Flu vaccine today  Past Medical History:  Diagnosis Date  . Abdominal abscess 12-17-10   abdominal abscesses x2 ? one at this time  . Anemia   . Arthritis    "all over" (08/23/2012)  . Blind right eye    fell and crushed socket and eye fell out, was replaced at [facility]  . CHF (congestive heart failure) ([place])   . Chronic lower back pain   . Coronary artery disease    normal coronaries by 08/25/12 cath  . Dyspnea   . Dysrhythmia    irregular, "skips beats"  . ESRD (end stage renal disease) on dialysis ([place])    "started 12/2011; [place]; TTS" (08/23/2012  . Exertional shortness of breath   . Eye drainage    "lots; since fall 07/2011" (08/23/2012)  . Gout   . H/O hiatal hernia   . Headache(784.0)    "weekly sometimes; since I fell and hit my head in 07/2011 08/23/2012)  . Heart murmur   . History of blood transfusion    "lots before starting dialysis" (08/23/2012)  . Hypertension    sees Dr. Willey Blade  . Inhalation injury    "worked at [employer]; can't inhale polyurethane or paint, etc" (08/23/2012)  . Myocardial infarction ([place]) 1970's or 1980's  . Neuromuscular disorder ([place])    diabetic neuropathy  . OSA on CPAP   . Staph aureus infection November 2012   . Swelling of both ankles 12-17-10  . Type II diabetes mellitus ([place])    "over 10 years now" (08/23/2012)    Tobacco History: Social History   Tobacco Use  Smoking Status Former Smoker  . Packs/day: 0.50  . Years: 20.00  . Pack years: 10.00  . Types: Cigarettes  . Last attempt to quit:  12/16/1980  . Years since quitting: 37.1  Smokeless Tobacco Never Used   Counseling given: Yes  Continue not smoking   Outpatient Encounter Medications as of 02/03/2018  Medication Sig  . acetaminophen (TYLENOL) 325 MG tablet Take 2 tablets (650 mg total) by mouth every 6 (six) hours as needed for fever, headache or moderate pain.  Marland Kitchen albuterol (PROVENTIL) (2.5 MG/3ML) 0.083% nebulizer solution Take 3 mLs (2.5 mg total) by nebulization every 2 (two) hours as needed for shortness of breath.  Marland Kitchen aspirin EC 81 MG tablet Take 81 mg by mouth daily.    Marland Kitchen atorvastatin (LIPITOR) 40 MG tablet Take 40 mg by mouth daily.   . budesonide-formoterol (SYMBICORT) 160-4.5 MCG/ACT inhaler Inhale 2 puffs into the lungs 2 (two) times daily.  . cinacalcet (SENSIPAR) 90 MG tablet Take 90 mg by mouth daily.   . diphenhydrAMINE (BENADRYL) 25 mg capsule Take 50 mg by mouth See admin instructions. Take 2 tablets (50 mg) by mouth prior to dialysis on Tuesday, Thursday, Saturday - for itching  . ethyl chloride spray Apply 1 application topically See admin instructions. Apply topically before dialysis on Tuesday, Thursday, Saturday  . guaiFENesin (MUCINEX) 600 MG 12 hr tablet Take 1 tablet (600 mg total) by mouth 2 (two) times daily.  . insulin glargine (LANTUS) 100 UNIT/ML injection Inject 0.05 mLs (5 Units total) into the skin at bedtime. (Patient taking differently:  Inject 6 Units into the skin at bedtime. )  . midodrine (PROAMATINE) 10 MG tablet Take 10 mg by mouth See admin instructions. Take 10mg  on Tues, Thurs, and Saturday before dialysis  . Multiple Vitamin (MULTIVITAMIN WITH MINERALS) TABS tablet Take 1 tablet by mouth daily.  Marland Kitchen oxyCODONE-acetaminophen (PERCOCET/ROXICET) 5-325 MG tablet Take 1 tablet by mouth 4 (four) times daily as needed for pain.  . predniSONE (DELTASONE) 20 MG tablet 2 tabs po daily x 4 days  . sevelamer carbonate (RENVELA) 800 MG tablet Take 2-3 tablets (1,600-2,400 mg total) by mouth See  admin instructions. Take 3 tablets (2,400 mg) by mouth 3 times daily with meals. Take 2 tablets (1,600 mg) by mouth with snacks. Receives supply from dialysis center. (Patient taking differently: Take 1,600-2,400 mg by mouth See admin instructions. Pt takes 4 tablets after every meal)  . Tiotropium Bromide Monohydrate (SPIRIVA RESPIMAT) 2.5 MCG/ACT AERS Inhale 2 puffs into the lungs daily.  . [DISCONTINUED] Tiotropium Bromide Monohydrate (SPIRIVA RESPIMAT) 2.5 MCG/ACT AERS Inhale 2 puffs into the lungs daily.  . benzonatate (TESSALON) 200 MG capsule Take 1 capsule (200 mg total) by mouth 3 (three) times daily as needed for cough. (Patient not taking: Reported on 02/03/2018)   No facility-administered encounter medications on file as of 02/03/2018.     Review of Systems  Review of Systems  Constitutional: Positive for fatigue. Negative for activity change and fever.  HENT: Positive for postnasal drip. Negative for congestion, sinus pressure and sneezing.   Respiratory: Positive for cough (improved, dry cough ), shortness of breath (improved ) and wheezing (improved).   Cardiovascular: Negative for chest pain and palpitations.  Gastrointestinal: Negative for diarrhea, nausea and vomiting.  Genitourinary:       Tues Thurs Sat - HD   Psychiatric/Behavioral: Negative for dysphoric mood. The patient is not nervous/anxious.      Physical Exam  BP 112/82 (BP Location: Right Arm, Cuff Size: Large)   Pulse 83   Ht 5\' 6"  (1.676 m)   Wt 278 lb 9.6 oz (126.4 kg)   SpO2 95%   BMI 44.97 kg/m   Wt Readings from Last 5 Encounters:  02/03/18 278 lb 9.6 oz (126.4 kg)  01/25/18 279 lb 9.6 oz (126.8 kg)  01/24/18 274 lb 4.8 oz (124.4 kg)  10/08/17 272 lb (123.4 kg)  08/24/17 272 lb (123.4 kg)     Physical Exam  Constitutional: She is oriented to person, place, and time and well-developed, well-nourished, and in no distress. No distress.  HENT:  Head: Normocephalic and atraumatic.  Right Ear:  Hearing, tympanic membrane, external ear and ear canal normal.  Left Ear: Hearing, tympanic membrane, external ear and ear canal normal.  Nose: Nose normal. Right sinus exhibits no maxillary sinus tenderness and no frontal sinus tenderness. Left sinus exhibits no maxillary sinus tenderness and no frontal sinus tenderness.  Mouth/Throat: Uvula is midline and oropharynx is clear and moist. No oropharyngeal exudate.  + pnd  Eyes: Pupils are equal, round, and reactive to light.  Neck: Normal range of motion. Neck supple.  Cardiovascular: Normal rate, regular rhythm and normal heart sounds.  + Distant heart sounds  Pulmonary/Chest: Effort normal and breath sounds normal. No accessory muscle usage. No respiratory distress. She has no decreased breath sounds. She has no wheezes. She has no rhonchi. She has no rales.  Abdominal: Soft. Bowel sounds are normal. There is no abdominal tenderness.  Musculoskeletal: Normal range of motion.  Lymphadenopathy:    She has no  cervical adenopathy.  Neurological: She is alert and oriented to person, place, and time. Gait normal.  + Walking with cane  Skin: Skin is warm and dry. She is not diaphoretic. No erythema.  Psychiatric: Mood, memory, affect and judgment normal.  Nursing note and vitals reviewed.     Lab Results:  CBC    Component Value Date/Time   WBC 10.6 (H) 01/25/2018 1827   RBC 3.66 (L) 01/25/2018 1827   HGB 10.7 (L) 01/25/2018 1827   HCT 33.3 (L) 01/25/2018 1827   PLT 215 01/25/2018 1827   MCV 91.0 01/25/2018 1827   MCH 29.2 01/25/2018 1827   MCHC 32.1 01/25/2018 1827   RDW 17.4 (H) 01/25/2018 1827   LYMPHSABS 4.5 (H) 01/24/2018 0632   MONOABS 1.3 (H) 01/24/2018 0632   EOSABS 0.2 01/24/2018 0632   BASOSABS 0.0 01/24/2018 0632    BMET    Component Value Date/Time   NA 139 01/25/2018 1827   K 4.2 01/25/2018 1827   CL 99 01/25/2018 1827   CO2 26 01/25/2018 1827   GLUCOSE 122 (H) 01/25/2018 1827   BUN 78 (H) 01/25/2018 1827    CREATININE 9.85 (H) 01/25/2018 1827   CREATININE 2.86 (H) 10/10/2010 1140   CALCIUM 8.2 (L) 01/25/2018 1827   CALCIUM 8.7 01/08/2010 1121   GFRNONAA 4 (L) 01/25/2018 1827   GFRAA 4 (L) 01/25/2018 1827    BNP    Component Value Date/Time   BNP 118.7 (H) 01/16/2018 1825    ProBNP    Component Value Date/Time   PROBNP 2632.0 (H) 07/02/2010 2056      Assessment & Plan:     Acute bronchitis due to Rhinovirus Assessment: Lungs clear to auscultation Patient reports significant improvement with cough, fatigue, wheezing Patient reports adherence to Spiriva 2.5 and Symbicort 160  Plan: Continue follow-up with our office in 6 weeks Continue Spiriva 2.5 Continue Symbicort 160  OSA  Plan: Continue to monitor for fatigue and daytime sleepiness Continue to hold CPAP at patient's request  Centrilobular emphysema (HCC) Assessment: Lungs clear to auscultation Patient reports improved fatigue, wheezing, cough  Plan: Continue Symbicort 160 Continue Spiriva 2.5 Ensured patient has prescriptions for these inhalers long-term High-dose flu vaccine today Follow-up with our office in 6 weeks   ESRD needing dialysis [facility]) Plan: Continue Tuesday Thursday Saturday dialysis Continue follow-up with Dr. Florene Glen High-dose flu vaccine today     Lauraine Rinne, NP 02/03/2018   This appointment was 32 min long with over 50% of the time in direct face-to-face patient care, assessment, plan of care, and follow-up.

## 2018-02-03 NOTE — Assessment & Plan Note (Signed)
Assessment: Lungs clear to auscultation Patient reports improved fatigue, wheezing, cough  Plan: Continue Symbicort 160 Continue Spiriva 2.5 Ensured patient has prescriptions for these inhalers long-term High-dose flu vaccine today Follow-up with our office in 6 weeks

## 2018-02-03 NOTE — Patient Instructions (Addendum)
Continue Symbicort 160 >>> 2 puffs in the morning right when you wake up, rinse out your mouth after use, 12 hours later 2 puffs, rinse after use >>> Take this daily, no matter what >>> This is not a rescue inhaler   Spiriva Respimat 2.5 >>> 2 puffs daily >>> Do this every day >>>This is not a rescue inhaler  Note your daily symptoms > remember "red flags" for COPD:   >>>Increase in cough >>>increase in sputum production >>>increase in shortness of breath or activity  intolerance.   If you notice these symptoms, please call the office to be seen.   High-dose flu vaccine today  Cancel scheduled follow-up with Wyn Quaker in February  Schedule follow-up with Dr. Elsworth Soho in 6 weeks >>>If no appointments are available in 6 weeks and schedule next available with Dr. Elsworth Soho around that timeframe  It is flu season:   >>>Remember to be washing your hands regularly, using hand sanitizer, be careful to use around herself with has contact with people who are sick will increase her chances of getting sick yourself. >>> Best ways to protect herself from the flu: Receive the yearly flu vaccine, practice good hand hygiene washing with soap and also using hand sanitizer when available, eat a nutritious meals, get adequate rest, hydrate appropriately   Please contact the office if your symptoms worsen or you have concerns that you are not improving.   Thank you for choosing [facility] for your healthcare, and for allowing [place] to partner with you on your healthcare journey. I am thankful to be able to provide care to you today.   Wyn Quaker FNP-C    Chronic Obstructive Pulmonary Disease Chronic obstructive pulmonary disease (COPD) is a long-term (chronic) lung problem. When you have COPD, it is hard for air to get in and out of your lungs. Usually the condition gets worse over time, and your lungs will never return to normal. There are things you can do to keep yourself as healthy as  possible.  Your doctor may treat your condition with: ? Medicines. ? Oxygen. ? Lung surgery.  Your doctor may also recommend: ? Rehabilitation. This includes steps to make your body work better. It may involve a team of specialists. ? Quitting smoking, if you smoke. ? Exercise and changes to your diet. ? Comfort measures (palliative care). Follow these instructions at home: Medicines  Take over-the-counter and prescription medicines only as told by your doctor.  Talk to your doctor before taking any cough or allergy medicines. You may need to avoid medicines that cause your lungs to be dry. Lifestyle  If you smoke, stop. Smoking makes the problem worse. If you need help quitting, ask your doctor.  Avoid being around things that make your breathing worse. This may include smoke, chemicals, and fumes.  Stay active, but remember to rest as well.  Learn and use tips on how to relax.  Make sure you get enough sleep. Most adults need at least 7 hours of sleep every night.  Eat healthy foods. Eat smaller meals more often. Rest before meals. Controlled breathing Learn and use tips on how to control your breathing as told by your doctor. Try:  Breathing in (inhaling) through your nose for 1 second. Then, pucker your lips and breath out (exhale) through your lips for 2 seconds.  Putting one hand on your belly (abdomen). Breathe in slowly through your nose for 1 second. Your hand on your belly should move out. Pucker your  lips and breathe out slowly through your lips. Your hand on your belly should move in as you breathe out.  Controlled coughing Learn and use controlled coughing to clear mucus from your lungs. Follow these steps: 1. Lean your head a little forward. 2. Breathe in deeply. 3. Try to hold your breath for 3 seconds. 4. Keep your mouth slightly open while coughing 2 times. 5. Spit any mucus out into a tissue. 6. Rest and do the steps again 1 or 2 times as  needed. General instructions  Make sure you get all the shots (vaccines) that your doctor recommends. Ask your doctor about a flu shot and a pneumonia shot.  Use oxygen therapy and pulmonary rehabilitation if told by your doctor. If you need home oxygen therapy, ask your doctor if you should buy a tool to measure your oxygen level (oximeter).  Make a COPD action plan with your doctor. This helps you to know what to do if you feel worse than usual.  Manage any other conditions you have as told by your doctor.  Avoid going outside when it is very hot, cold, or humid.  Avoid people who have a sickness you can catch (contagious).  Keep all follow-up visits as told by your doctor. This is important. Contact a doctor if:  You cough up more mucus than usual.  There is a change in the color or thickness of the mucus.  It is harder to breathe than usual.  Your breathing is faster than usual.  You have trouble sleeping.  You need to use your medicines more often than usual.  You have trouble doing your normal activities such as getting dressed or walking around the house. Get help right away if:  You have shortness of breath while resting.  You have shortness of breath that stops you from: ? Being able to talk. ? Doing normal activities.  Your chest hurts for longer than 5 minutes.  Your skin color is more blue than usual.  Your pulse oximeter shows that you have low oxygen for longer than 5 minutes.  You have a fever.  You feel too tired to breathe normally. Summary  Chronic obstructive pulmonary disease (COPD) is a long-term lung problem.  The way your lungs work will never return to normal. Usually the condition gets worse over time. There are things you can do to keep yourself as healthy as possible.  Take over-the-counter and prescription medicines only as told by your doctor.  If you smoke, stop. Smoking makes the problem worse. This information is not intended to  replace advice given to you by your health care provider. Make sure you discuss any questions you have with your health care provider. Document Released: 06/25/2007 Document Revised: 02/11/2016 Document Reviewed: 02/11/2016 Elsevier Interactive Patient Education  2019 [place].    Influenza Virus Vaccine injection What is this medicine? INFLUENZA VIRUS VACCINE (in floo EN zuh VAHY ruhs vak SEEN) helps to reduce the risk of getting influenza also known as the flu. The vaccine only helps protect you against some strains of the flu. This medicine may be used for other purposes; ask your health care provider or pharmacist if you have questions. COMMON BRAND NAME(S): Afluria, Agriflu, Alfuria, FLUAD, Fluarix, Fluarix Quadrivalent, Flublok, Flublok Quadrivalent, FLUCELVAX, Flulaval, Fluvirin, Fluzone, Fluzone High-Dose, Fluzone Intradermal What should I tell my health care provider before I take this medicine? They need to know if you have any of these conditions: -bleeding disorder like hemophilia -fever or infection -  Guillain-Barre syndrome or other neurological problems -immune system problems -infection with the human immunodeficiency virus (HIV) or AIDS -low blood platelet counts -multiple sclerosis -an unusual or allergic reaction to influenza virus vaccine, latex, other medicines, foods, dyes, or preservatives. Different brands of vaccines contain different allergens. Some may contain latex or eggs. Talk to your doctor about your allergies to make sure that you get the right vaccine. -pregnant or trying to get pregnant -breast-feeding How should I use this medicine? This vaccine is for injection into a muscle or under the skin. It is given by a health care professional. A copy of Vaccine Information Statements will be given before each vaccination. Read this sheet carefully each time. The sheet may change frequently. Talk to your healthcare provider to see which vaccines are right for  you. Some vaccines should not be used in all age groups. Overdosage: If you think you have taken too much of this medicine contact a poison control center or emergency room at once. NOTE: This medicine is only for you. Do not share this medicine with others. What if I miss a dose? This does not apply. What may interact with this medicine? -chemotherapy or radiation therapy -medicines that lower your immune system like etanercept, anakinra, infliximab, and adalimumab -medicines that treat or prevent blood clots like warfarin -phenytoin -steroid medicines like prednisone or cortisone -theophylline -vaccines This list may not describe all possible interactions. Give your health care provider a list of all the medicines, herbs, non-prescription drugs, or dietary supplements you use. Also tell them if you smoke, drink alcohol, or use illegal drugs. Some items may interact with your medicine. What should I watch for while using this medicine? Report any side effects that do not go away within 3 days to your doctor or health care professional. Call your health care provider if any unusual symptoms occur within 6 weeks of receiving this vaccine. You may still catch the flu, but the illness is not usually as bad. You cannot get the flu from the vaccine. The vaccine will not protect against colds or other illnesses that may cause fever. The vaccine is needed every year. What side effects may I notice from receiving this medicine? Side effects that you should report to your doctor or health care professional as soon as possible: -allergic reactions like skin rash, itching or hives, swelling of the face, lips, or tongue Side effects that usually do not require medical attention (report to your doctor or health care professional if they continue or are bothersome): -fever -headache -muscle aches and pains -pain, tenderness, redness, or swelling at the injection site -tiredness This list may not describe  all possible side effects. Call your doctor for medical advice about side effects. You may report side effects to FDA at 1-800-FDA-1088. Where should I keep my medicine? The vaccine will be given by a health care professional in a clinic, pharmacy, doctor's office, or other health care setting. You will not be given vaccine doses to store at home. NOTE: This sheet is a summary. It may not cover all possible information. If you have questions about this medicine, talk to your doctor, pharmacist, or health care provider.  2019 Elsevier/Gold Standard (2014-07-28 10:07:28)

## 2018-02-04 DIAGNOSIS — N2581 Secondary hyperparathyroidism of renal origin: Secondary | ICD-10-CM | POA: Diagnosis not present

## 2018-02-04 DIAGNOSIS — N186 End stage renal disease: Secondary | ICD-10-CM | POA: Diagnosis not present

## 2018-02-04 DIAGNOSIS — R6889 Other general symptoms and signs: Secondary | ICD-10-CM | POA: Diagnosis not present

## 2018-02-05 DIAGNOSIS — J441 Chronic obstructive pulmonary disease with (acute) exacerbation: Secondary | ICD-10-CM | POA: Diagnosis not present

## 2018-02-05 DIAGNOSIS — Z992 Dependence on renal dialysis: Secondary | ICD-10-CM | POA: Diagnosis not present

## 2018-02-05 DIAGNOSIS — I10 Essential (primary) hypertension: Secondary | ICD-10-CM | POA: Diagnosis not present

## 2018-02-05 DIAGNOSIS — N186 End stage renal disease: Secondary | ICD-10-CM | POA: Diagnosis not present

## 2018-02-05 DIAGNOSIS — E1169 Type 2 diabetes mellitus with other specified complication: Secondary | ICD-10-CM | POA: Diagnosis not present

## 2018-02-05 DIAGNOSIS — Z6841 Body Mass Index (BMI) 40.0 and over, adult: Secondary | ICD-10-CM | POA: Diagnosis not present

## 2018-02-05 DIAGNOSIS — N2581 Secondary hyperparathyroidism of renal origin: Secondary | ICD-10-CM | POA: Diagnosis not present

## 2018-02-06 DIAGNOSIS — R6889 Other general symptoms and signs: Secondary | ICD-10-CM | POA: Diagnosis not present

## 2018-02-09 DIAGNOSIS — N186 End stage renal disease: Secondary | ICD-10-CM | POA: Diagnosis not present

## 2018-02-09 DIAGNOSIS — N2581 Secondary hyperparathyroidism of renal origin: Secondary | ICD-10-CM | POA: Diagnosis not present

## 2018-02-09 DIAGNOSIS — R6889 Other general symptoms and signs: Secondary | ICD-10-CM | POA: Diagnosis not present

## 2018-02-11 DIAGNOSIS — R6889 Other general symptoms and signs: Secondary | ICD-10-CM | POA: Diagnosis not present

## 2018-02-11 DIAGNOSIS — N2581 Secondary hyperparathyroidism of renal origin: Secondary | ICD-10-CM | POA: Diagnosis not present

## 2018-02-11 DIAGNOSIS — N186 End stage renal disease: Secondary | ICD-10-CM | POA: Diagnosis not present

## 2018-02-13 DIAGNOSIS — R6889 Other general symptoms and signs: Secondary | ICD-10-CM | POA: Diagnosis not present

## 2018-02-13 DIAGNOSIS — N186 End stage renal disease: Secondary | ICD-10-CM | POA: Diagnosis not present

## 2018-02-13 DIAGNOSIS — N2581 Secondary hyperparathyroidism of renal origin: Secondary | ICD-10-CM | POA: Diagnosis not present

## 2018-02-16 DIAGNOSIS — N2581 Secondary hyperparathyroidism of renal origin: Secondary | ICD-10-CM | POA: Diagnosis not present

## 2018-02-16 DIAGNOSIS — R6889 Other general symptoms and signs: Secondary | ICD-10-CM | POA: Diagnosis not present

## 2018-02-16 DIAGNOSIS — N186 End stage renal disease: Secondary | ICD-10-CM | POA: Diagnosis not present

## 2018-02-18 DIAGNOSIS — R6889 Other general symptoms and signs: Secondary | ICD-10-CM | POA: Diagnosis not present

## 2018-02-19 DIAGNOSIS — N186 End stage renal disease: Secondary | ICD-10-CM | POA: Diagnosis not present

## 2018-02-19 DIAGNOSIS — E1129 Type 2 diabetes mellitus with other diabetic kidney complication: Secondary | ICD-10-CM | POA: Diagnosis not present

## 2018-02-19 DIAGNOSIS — N2581 Secondary hyperparathyroidism of renal origin: Secondary | ICD-10-CM | POA: Diagnosis not present

## 2018-02-19 DIAGNOSIS — Z992 Dependence on renal dialysis: Secondary | ICD-10-CM | POA: Diagnosis not present

## 2018-02-20 DIAGNOSIS — D631 Anemia in chronic kidney disease: Secondary | ICD-10-CM | POA: Diagnosis not present

## 2018-02-20 DIAGNOSIS — D509 Iron deficiency anemia, unspecified: Secondary | ICD-10-CM | POA: Diagnosis not present

## 2018-02-20 DIAGNOSIS — E1129 Type 2 diabetes mellitus with other diabetic kidney complication: Secondary | ICD-10-CM | POA: Diagnosis not present

## 2018-02-20 DIAGNOSIS — N186 End stage renal disease: Secondary | ICD-10-CM | POA: Diagnosis not present

## 2018-02-20 DIAGNOSIS — Z992 Dependence on renal dialysis: Secondary | ICD-10-CM | POA: Diagnosis not present

## 2018-02-20 DIAGNOSIS — E119 Type 2 diabetes mellitus without complications: Secondary | ICD-10-CM | POA: Diagnosis not present

## 2018-02-20 DIAGNOSIS — N2581 Secondary hyperparathyroidism of renal origin: Secondary | ICD-10-CM | POA: Diagnosis not present

## 2018-02-20 IMAGING — DX DG CHEST 1V PORT
1 series · 1 of 1 positions shown · non-contrast
Comparison: 02/03/2016

CLINICAL DATA: Shortness of breath.

EXAM:
PORTABLE CHEST 1 VIEW

[chest ap]
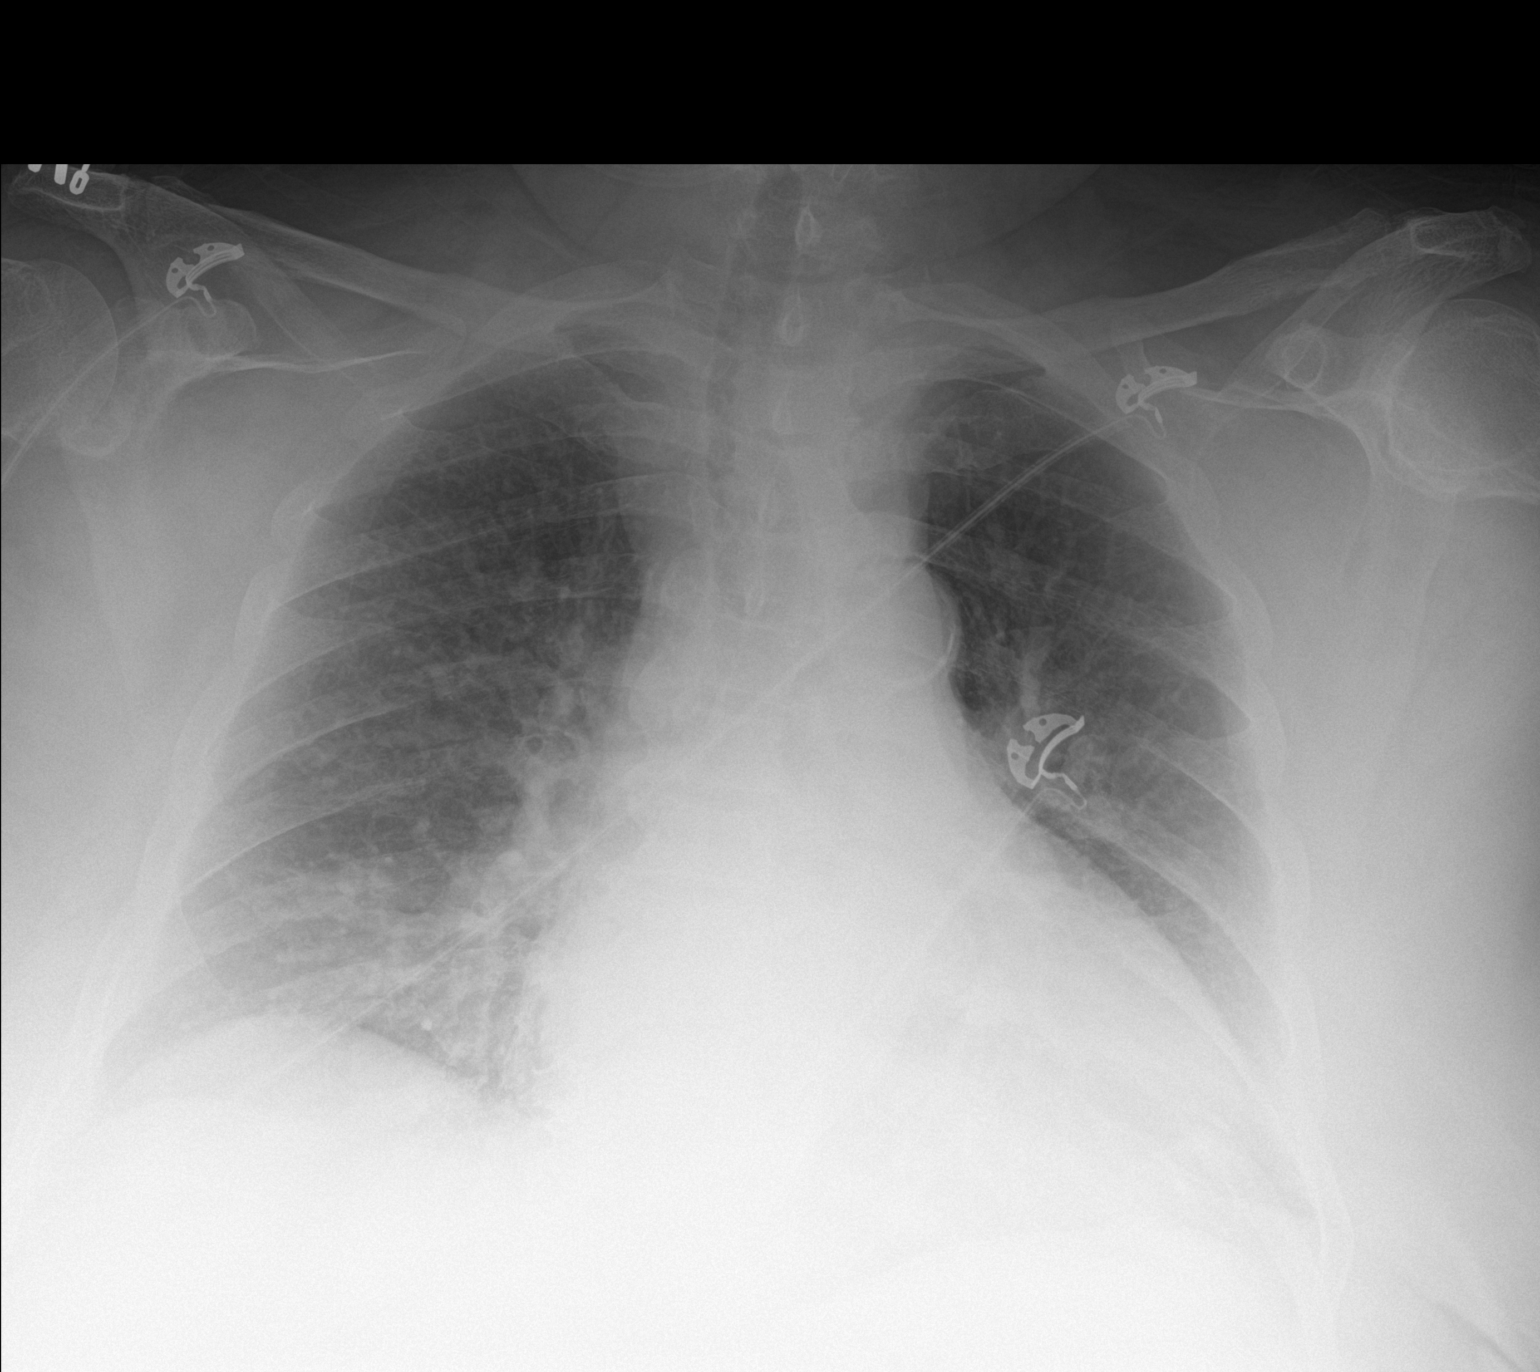

[1 of 1 positions shown; findings below may reference images not displayed]

FINDINGS: The cardio pericardial silhouette is enlarged. Pulmonary vascular
congestion noted with diffuse interstitial prominence suggesting
edema. The visualized bony structures of the thorax are intact.
Telemetry leads overlie the chest.
IMPRESSION: Cardiomegaly with vascular congestion and probable interstitial
edema.

## 2018-02-22 ENCOUNTER — Ambulatory Visit: Payer: Medicare HMO | Admitting: Pulmonary Disease

## 2018-02-23 DIAGNOSIS — N2581 Secondary hyperparathyroidism of renal origin: Secondary | ICD-10-CM | POA: Diagnosis not present

## 2018-02-23 DIAGNOSIS — E119 Type 2 diabetes mellitus without complications: Secondary | ICD-10-CM | POA: Diagnosis not present

## 2018-02-23 DIAGNOSIS — D631 Anemia in chronic kidney disease: Secondary | ICD-10-CM | POA: Diagnosis not present

## 2018-02-23 DIAGNOSIS — N186 End stage renal disease: Secondary | ICD-10-CM | POA: Diagnosis not present

## 2018-02-23 DIAGNOSIS — D509 Iron deficiency anemia, unspecified: Secondary | ICD-10-CM | POA: Diagnosis not present

## 2018-02-23 IMAGING — CT CT CHEST W/O CM
2 of 3 series · 15 of 36 positions shown, 18 images · non-contrast
Comparison: Chest radiograph October 16, 2016

CLINICAL DATA: Shortness of breath for 4 days. Shortness breath for
4 days. History of CHF, diabetes, end-stage renal disease on
dialysis.

EXAM:
CT CHEST WITHOUT CONTRAST
TECHNIQUE: Multidetector CT imaging of the chest was performed following the
standard protocol without IV contrast.

[Series 4: thorax 2.0 · axial · 0.75mm/px · z∈[+1294,+1546]mm · 12 of 150 slices shown, 15 images]
[im 12/150  mediastinal]
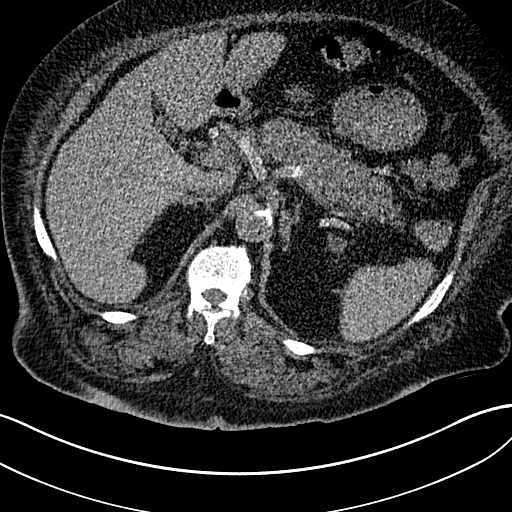
[im 12/150  lung]
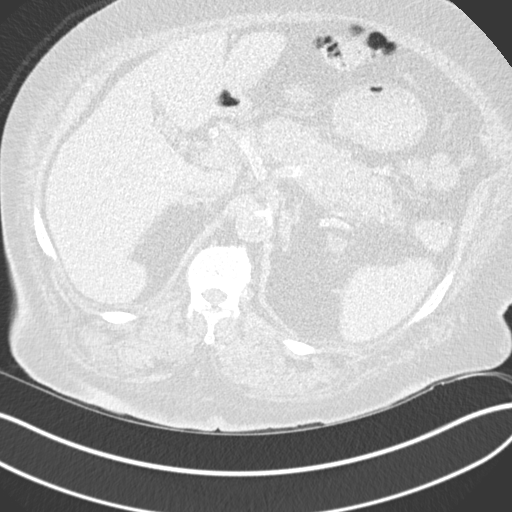
[im 23/150  lung]
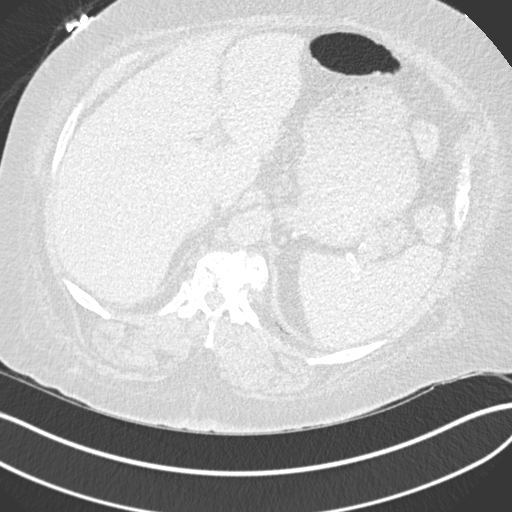
[im 34/150  lung]
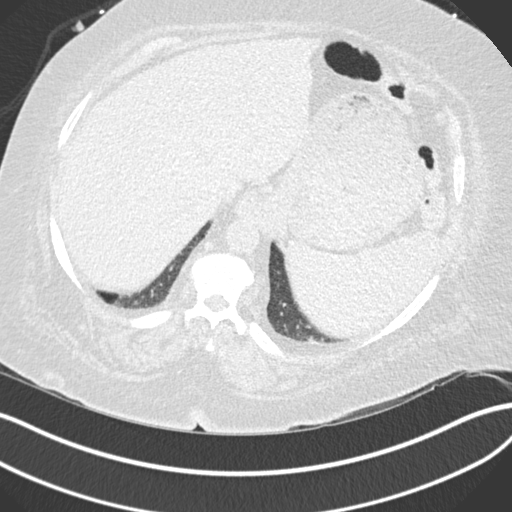
[im 45/150  lung]
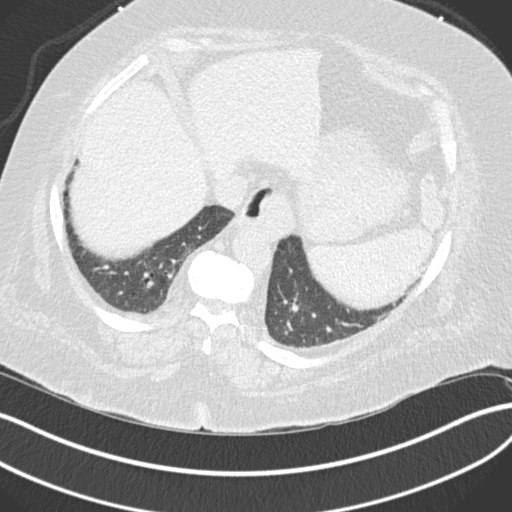
[im 56/150  mediastinal]
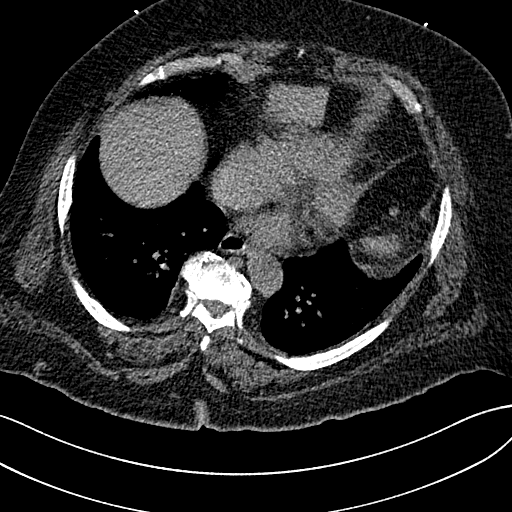
[im 56/150  lung]
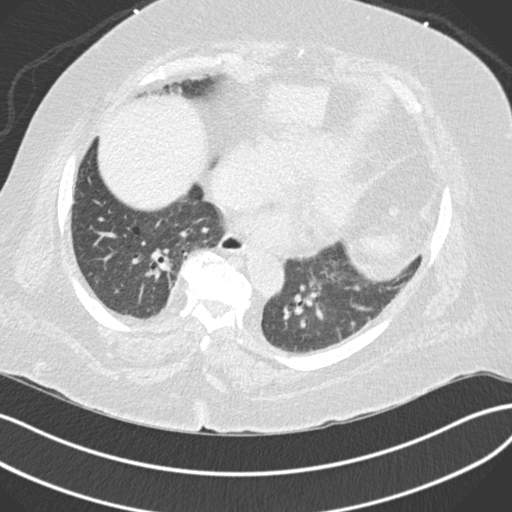
[im 67/150  lung]
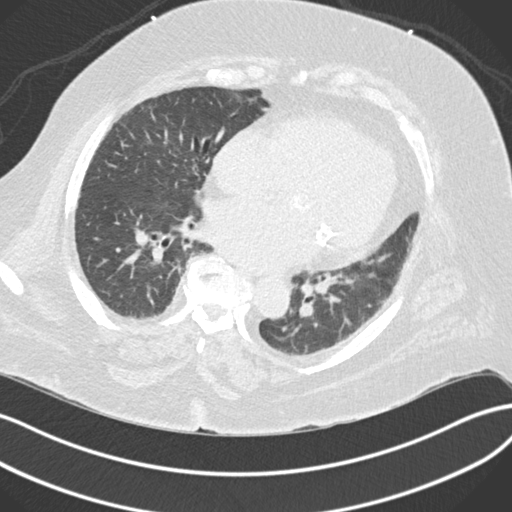
[im 83/150  lung]
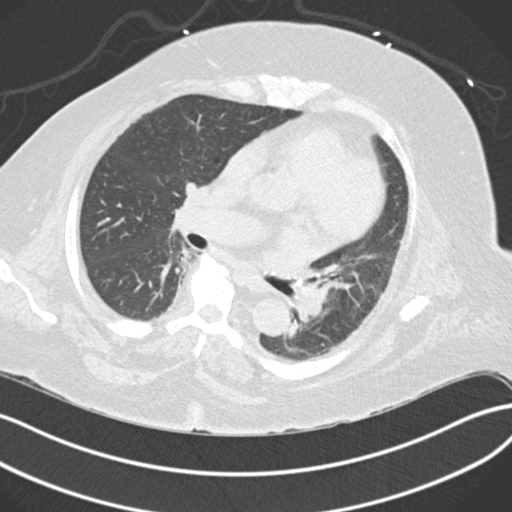
[im 94/150  lung]
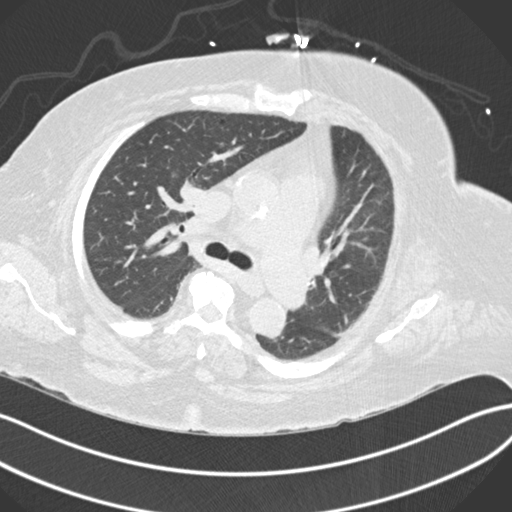
[im 105/150  mediastinal]
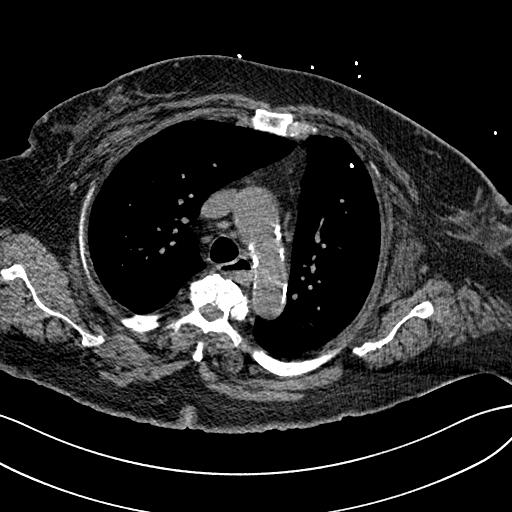
[im 105/150  lung]
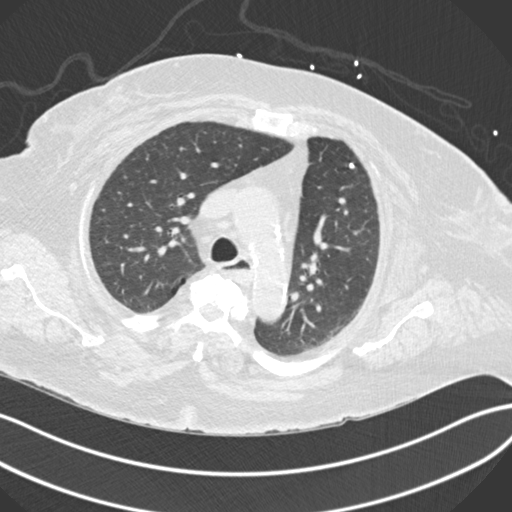
[im 116/150  lung]
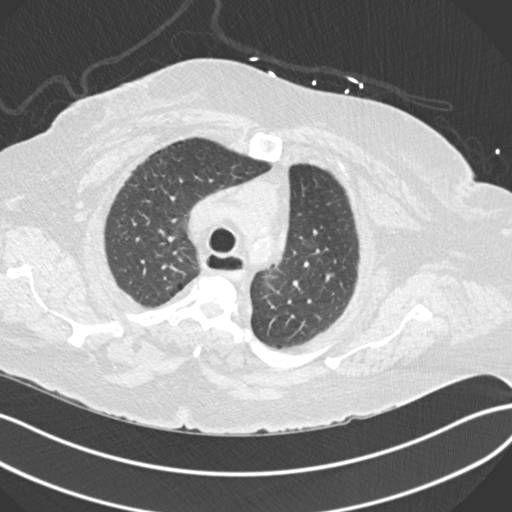
[im 127/150  lung]
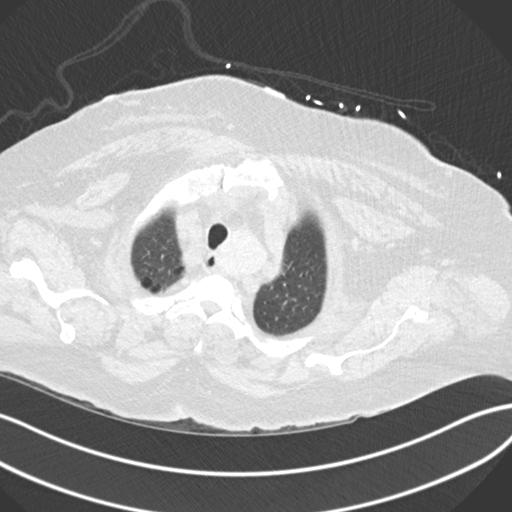
[im 138/150  lung]
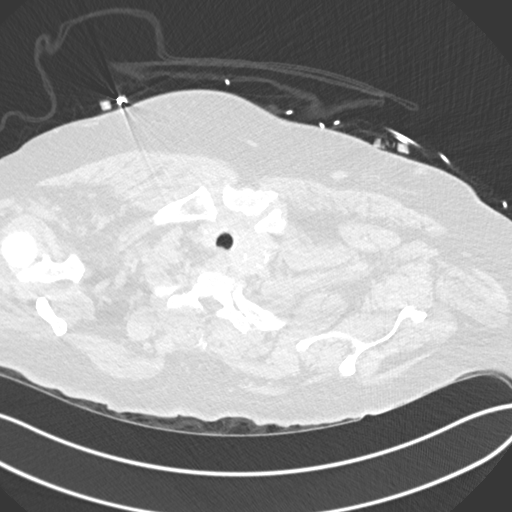

[Series 6: coronal · coronal · 0.61mm/px · 3 of 101 slices shown]
[im 21/101  lung]
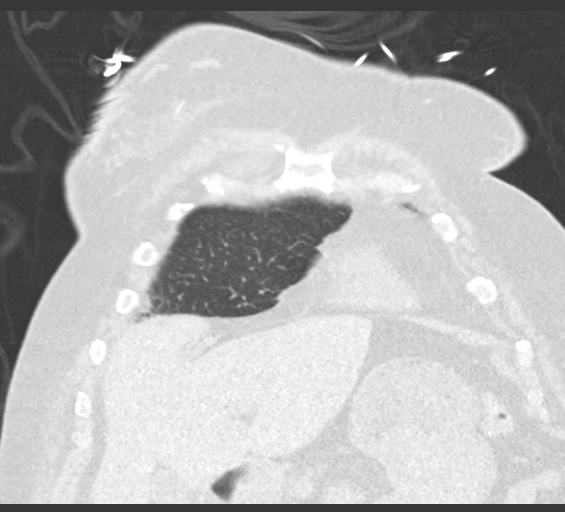
[im 41/101  lung]
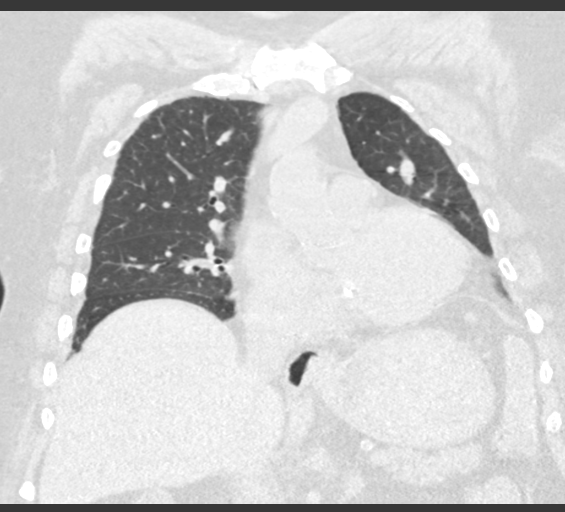
[im 61/101  lung]
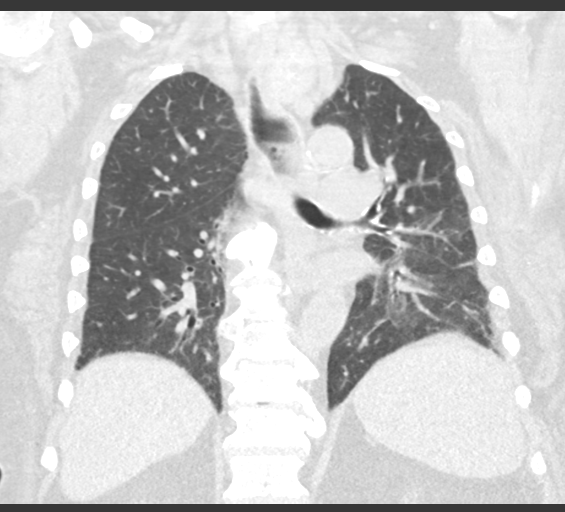

[15 of 36 positions shown; findings below may reference images not displayed]

FINDINGS: CARDIOVASCULAR: The heart is mildly enlarged. Mitral annular
calcifications. No pericardial effusion. Thoracic aorta is normal
course and caliber, moderate calcific atherosclerosis.

MEDIASTINUM/NODES: Enlarged LEFT thyroid lobe with coarse
calcifications, substernal extent but no dominant nodule by
noncontrast CT.

No mediastinal mass. No lymphadenopathy by CT size criteria. Normal
appearance of thoracic esophagus though not tailored for evaluation.

LUNGS/PLEURA: Tracheobronchial tree is patent, no pneumothorax. LEFT
basilar atelectasis. Mild centrilobular emphysema. Mild apical
bullous changes. 2 mm RIGHT lower lobe pulmonary nodule, below size
followup recommendation. Scattered calcified granulomas.

UPPER ABDOMEN: Small hiatal hernia. Mildly dilated esophagus with
air-fluid level.

MUSCULOSKELETAL: Included soft tissues and included osseous
structures are nonacute. RIGHT posterior chest wall varicosities.
Cystic degenerative change bilateral glenoid. Mild degenerative
change of thoracic spine.
IMPRESSION: 1. Mild cardiomegaly.
2. Mild centrilobular emphysema.  Dependent atelectasis.
3. LEFT thyromegaly with substernal extent, no dominant nodule.

Aortic Atherosclerosis (IQ28U-JMX.X) and Emphysema (IQ28U-A08.B).

## 2018-02-24 IMAGING — NM NM PULMONARY VENT & PERF
14 series · 14 of 14 positions shown · non-contrast
Comparison: CT 10/16/2016.  Chest x-ray 10/16/2016.

CLINICAL DATA: Shortness of breath post dialysis.

EXAM:
NUCLEAR MEDICINE VENTILATION - PERFUSION LUNG SCAN
TECHNIQUE: Ventilation images were obtained in multiple projections using
inhaled aerosol Xc-MMm DTPA. Perfusion images were obtained in
multiple projections after intravenous injection of Xc-MMm MAA.
RADIOPHARMACEUTICALS:  30 MCi Yechnetium-OOm DTPA aerosol inhalation
and 4 mCi Yechnetium-OOm MAA IV

[Series 1: ant/post vent · 4.14mm/px · 1 of 1 slices shown]
[im 1/1]
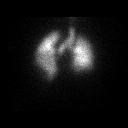

[Series 2: lao/rpo vent · 4.14mm/px · 1 of 1 slices shown]
[im 1/1]
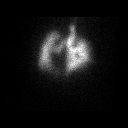

[Series 3: lpo/rao vent · 4.14mm/px · 1 of 1 slices shown (1 of 2)]
[im 1/1]
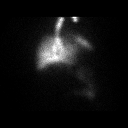

[Series 3: lpo/rao vent · 4.14mm/px · 1 of 1 slices shown (2 of 2)]
[im 1/1]
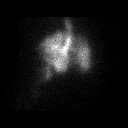

[Series 4: lt lat/rt lat vent · 4.14mm/px · 1 of 1 slices shown (1 of 2)]
[im 1/1]
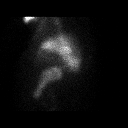

[Series 4: lt lat/rt lat vent · 4.14mm/px · 1 of 1 slices shown (2 of 2)]
[im 1/1]
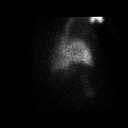

[Series 5: lt lat/rt lat perf · 4.14mm/px · 1 of 1 slices shown (1 of 2)]
[im 1/1]
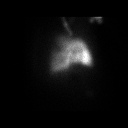

[Series 5: lt lat/rt lat perf · 4.14mm/px · 1 of 1 slices shown (2 of 2)]
[im 1/1]
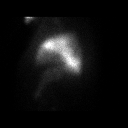

[Series 6: lpo/rao perf · 4.14mm/px · 1 of 1 slices shown (1 of 2)]
[im 1/1]
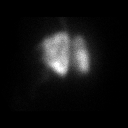

[Series 6: lpo/rao perf · 4.14mm/px · 1 of 1 slices shown (2 of 2)]
[im 1/1]
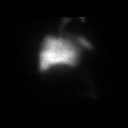

[Series 7: ant/post perf · 4.14mm/px · 1 of 1 slices shown (1 of 2)]
[im 1/1]
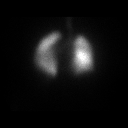

[Series 7: ant/post perf · 4.14mm/px · 1 of 1 slices shown (2 of 2)]
[im 1/1]
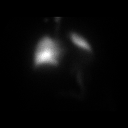

[Series 8: lao/rpo perf · 4.14mm/px · 1 of 1 slices shown (1 of 2)]
[im 1/1]
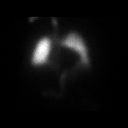

[Series 8: lao/rpo perf · 4.14mm/px · 1 of 1 slices shown (2 of 2)]
[im 1/1]
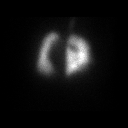

[14 of 14 positions shown; findings below may reference images not displayed]

FINDINGS: No ventilation perfusion defects noted suggest pulmonary embolus.
Small ventilatory defects, larger than perfusion defects.
IMPRESSION: Low probability pulmonary embolus.

## 2018-02-25 DIAGNOSIS — D631 Anemia in chronic kidney disease: Secondary | ICD-10-CM | POA: Diagnosis not present

## 2018-02-25 DIAGNOSIS — N2581 Secondary hyperparathyroidism of renal origin: Secondary | ICD-10-CM | POA: Diagnosis not present

## 2018-02-25 DIAGNOSIS — N186 End stage renal disease: Secondary | ICD-10-CM | POA: Diagnosis not present

## 2018-02-25 DIAGNOSIS — D509 Iron deficiency anemia, unspecified: Secondary | ICD-10-CM | POA: Diagnosis not present

## 2018-02-25 DIAGNOSIS — E119 Type 2 diabetes mellitus without complications: Secondary | ICD-10-CM | POA: Diagnosis not present

## 2018-02-27 DIAGNOSIS — D509 Iron deficiency anemia, unspecified: Secondary | ICD-10-CM | POA: Diagnosis not present

## 2018-02-27 DIAGNOSIS — E119 Type 2 diabetes mellitus without complications: Secondary | ICD-10-CM | POA: Diagnosis not present

## 2018-02-27 DIAGNOSIS — D631 Anemia in chronic kidney disease: Secondary | ICD-10-CM | POA: Diagnosis not present

## 2018-02-27 DIAGNOSIS — N2581 Secondary hyperparathyroidism of renal origin: Secondary | ICD-10-CM | POA: Diagnosis not present

## 2018-02-27 DIAGNOSIS — N186 End stage renal disease: Secondary | ICD-10-CM | POA: Diagnosis not present

## 2018-03-02 DIAGNOSIS — E119 Type 2 diabetes mellitus without complications: Secondary | ICD-10-CM | POA: Diagnosis not present

## 2018-03-02 DIAGNOSIS — D509 Iron deficiency anemia, unspecified: Secondary | ICD-10-CM | POA: Diagnosis not present

## 2018-03-02 DIAGNOSIS — D631 Anemia in chronic kidney disease: Secondary | ICD-10-CM | POA: Diagnosis not present

## 2018-03-02 DIAGNOSIS — N186 End stage renal disease: Secondary | ICD-10-CM | POA: Diagnosis not present

## 2018-03-02 DIAGNOSIS — N2581 Secondary hyperparathyroidism of renal origin: Secondary | ICD-10-CM | POA: Diagnosis not present

## 2018-03-04 DIAGNOSIS — D631 Anemia in chronic kidney disease: Secondary | ICD-10-CM | POA: Diagnosis not present

## 2018-03-04 DIAGNOSIS — N186 End stage renal disease: Secondary | ICD-10-CM | POA: Diagnosis not present

## 2018-03-04 DIAGNOSIS — D509 Iron deficiency anemia, unspecified: Secondary | ICD-10-CM | POA: Diagnosis not present

## 2018-03-04 DIAGNOSIS — N2581 Secondary hyperparathyroidism of renal origin: Secondary | ICD-10-CM | POA: Diagnosis not present

## 2018-03-04 DIAGNOSIS — E119 Type 2 diabetes mellitus without complications: Secondary | ICD-10-CM | POA: Diagnosis not present

## 2018-03-06 DIAGNOSIS — D631 Anemia in chronic kidney disease: Secondary | ICD-10-CM | POA: Diagnosis not present

## 2018-03-06 DIAGNOSIS — N186 End stage renal disease: Secondary | ICD-10-CM | POA: Diagnosis not present

## 2018-03-06 DIAGNOSIS — N2581 Secondary hyperparathyroidism of renal origin: Secondary | ICD-10-CM | POA: Diagnosis not present

## 2018-03-06 DIAGNOSIS — D509 Iron deficiency anemia, unspecified: Secondary | ICD-10-CM | POA: Diagnosis not present

## 2018-03-06 DIAGNOSIS — E119 Type 2 diabetes mellitus without complications: Secondary | ICD-10-CM | POA: Diagnosis not present

## 2018-03-09 DIAGNOSIS — D509 Iron deficiency anemia, unspecified: Secondary | ICD-10-CM | POA: Diagnosis not present

## 2018-03-09 DIAGNOSIS — D631 Anemia in chronic kidney disease: Secondary | ICD-10-CM | POA: Diagnosis not present

## 2018-03-09 DIAGNOSIS — E119 Type 2 diabetes mellitus without complications: Secondary | ICD-10-CM | POA: Diagnosis not present

## 2018-03-09 DIAGNOSIS — N2581 Secondary hyperparathyroidism of renal origin: Secondary | ICD-10-CM | POA: Diagnosis not present

## 2018-03-09 DIAGNOSIS — N186 End stage renal disease: Secondary | ICD-10-CM | POA: Diagnosis not present

## 2018-03-11 DIAGNOSIS — N2581 Secondary hyperparathyroidism of renal origin: Secondary | ICD-10-CM | POA: Diagnosis not present

## 2018-03-11 DIAGNOSIS — D631 Anemia in chronic kidney disease: Secondary | ICD-10-CM | POA: Diagnosis not present

## 2018-03-11 DIAGNOSIS — N186 End stage renal disease: Secondary | ICD-10-CM | POA: Diagnosis not present

## 2018-03-11 DIAGNOSIS — D509 Iron deficiency anemia, unspecified: Secondary | ICD-10-CM | POA: Diagnosis not present

## 2018-03-11 DIAGNOSIS — E119 Type 2 diabetes mellitus without complications: Secondary | ICD-10-CM | POA: Diagnosis not present

## 2018-03-12 DIAGNOSIS — D631 Anemia in chronic kidney disease: Secondary | ICD-10-CM | POA: Diagnosis not present

## 2018-03-12 DIAGNOSIS — E119 Type 2 diabetes mellitus without complications: Secondary | ICD-10-CM | POA: Diagnosis not present

## 2018-03-12 DIAGNOSIS — N2581 Secondary hyperparathyroidism of renal origin: Secondary | ICD-10-CM | POA: Diagnosis not present

## 2018-03-12 DIAGNOSIS — N186 End stage renal disease: Secondary | ICD-10-CM | POA: Diagnosis not present

## 2018-03-12 DIAGNOSIS — D509 Iron deficiency anemia, unspecified: Secondary | ICD-10-CM | POA: Diagnosis not present

## 2018-03-15 ENCOUNTER — Encounter: Payer: Self-pay | Admitting: Pulmonary Disease

## 2018-03-15 ENCOUNTER — Ambulatory Visit (INDEPENDENT_AMBULATORY_CARE_PROVIDER_SITE_OTHER): Payer: Medicare Other | Admitting: Pulmonary Disease

## 2018-03-15 ENCOUNTER — Other Ambulatory Visit: Payer: Self-pay

## 2018-03-15 DIAGNOSIS — I5032 Chronic diastolic (congestive) heart failure: Secondary | ICD-10-CM | POA: Diagnosis not present

## 2018-03-15 DIAGNOSIS — J432 Centrilobular emphysema: Secondary | ICD-10-CM | POA: Diagnosis not present

## 2018-03-15 MED ORDER — TIOTROPIUM BROMIDE MONOHYDRATE 2.5 MCG/ACT IN AERS: 2.0000 | INHALATION_SPRAY | Freq: Every day | RESPIRATORY_TRACT | 3 refills | Status: DC

## 2018-03-15 NOTE — Patient Instructions (Signed)
Refills on Spiriva Respimat 2 puffs once daily Continue Symbicort 2 puffs twice daily  Referral to advanced heart failure service-Dr. Haroldine Laws or Dr. Aundra Dubin

## 2018-03-15 NOTE — Progress Notes (Signed)
   Subjective:    Patient ID: [name], female    DOB: [birth year], 73 y.o.   MRN: [id number]  HPI  73 year old remote smoker with ESRD on dialysis and mild emphysema on CT and mild OSA. She had an hospitalization 12/28 -01/24/2018 for what was labeled as COPD exacerbation due to rhinovirus. She had another ED visit on 1/6.  PFTs did not show significant airway obstruction but do show DLCO decreased to 52%.  He was started on Symbicort and Spiriva based on pulmonary consult in the hospital. Her dry weight is around 125 kg and she is at 128 kg today.  Watches the salt in her diet.  Denies significant pedal edema but does complain of significant shortness of breath  Oxygen saturation does not drop significantly on walking  She feels that Spiriva inhaler did help significantly  Based on prior sleep study she only had mild OSA and I did not feel the need to continue CPAP    Significant tests/ events reviewed  01/26/2018-CT chest without contrast-Emphysema, aortic arthrosclerosis   10/16/2016-CT chest without contrast- mild cardiomegaly, mild centrilobular emphysema, 2 mm right lower lobe pulmonary nodule, mild apical bullous changes  10/17/2016-VQ scan-low probability of pulmonary embolus   04/06/2017-echocardiogram-LV ejection fraction 60 to 52%, grade 1 diastolic dysfunction, mild concentric hypertrophy, main pulmonary artery was normal-sized 03/2017 SPECT -EF 18%  12/25/2016-NPSG-mild obstructive sleep apnea-AHI 7.8, SaO2 low 92%, average SaO2 95%  10/27/2016-pulmonary function test-FVC 2.09 (87% predicted), postbronchodilator ratio 90, FEV1 98, DLCO 52  Review of Systems Patient denies significant dyspnea,cough, hemoptysis,  chest pain, palpitations, pedal edema, orthopnea, paroxysmal nocturnal dyspnea, lightheadedness, nausea, vomiting, abdominal or  leg pains      Objective:   Physical Exam  Gen. Pleasant, obese, in no distress ENT - no lesions, no post nasal  drip Neck: No JVD, no thyromegaly, no carotid bruits Lungs: no use of accessory muscles, no dullness to percussion, decreased without rales or rhonchi  Cardiovascular: Rhythm regular, heart sounds  normal, no murmurs or gallops, n1+ peripheral edema Musculoskeletal: No deformities, no cyanosis or clubbing , no tremors       Assessment & Plan:

## 2018-03-15 NOTE — Assessment & Plan Note (Signed)
EF has dropped to 18% on last imaging study in 03/2017. I feel this may be the main issue causing shortness of breath Referral to advanced heart failure service-Dr. Haroldine Laws or Dr. Aundra Dubin

## 2018-03-15 NOTE — Assessment & Plan Note (Signed)
Refills on Spiriva Respimat 2 puffs once daily Continue Symbicort 2 puffs twice daily  Lung function appears okay except for moderate decrease in DLCO.  Doubt that emphysema is the main issue here. She has significant other reasons for shortness of breath including obesity deconditioning and more importantly systolic heart failure with EF of 18%

## 2018-03-16 ENCOUNTER — Telehealth: Payer: Self-pay | Admitting: Pulmonary Disease

## 2018-03-16 DIAGNOSIS — E119 Type 2 diabetes mellitus without complications: Secondary | ICD-10-CM | POA: Diagnosis not present

## 2018-03-16 DIAGNOSIS — D509 Iron deficiency anemia, unspecified: Secondary | ICD-10-CM | POA: Diagnosis not present

## 2018-03-16 DIAGNOSIS — N186 End stage renal disease: Secondary | ICD-10-CM | POA: Diagnosis not present

## 2018-03-16 DIAGNOSIS — D631 Anemia in chronic kidney disease: Secondary | ICD-10-CM | POA: Diagnosis not present

## 2018-03-16 DIAGNOSIS — N2581 Secondary hyperparathyroidism of renal origin: Secondary | ICD-10-CM | POA: Diagnosis not present

## 2018-03-16 NOTE — Telephone Encounter (Signed)
ATC patient.  Left message to call back. 

## 2018-03-17 NOTE — Telephone Encounter (Signed)
LMTCB x2 for pt 

## 2018-03-18 DIAGNOSIS — D509 Iron deficiency anemia, unspecified: Secondary | ICD-10-CM | POA: Diagnosis not present

## 2018-03-18 DIAGNOSIS — E119 Type 2 diabetes mellitus without complications: Secondary | ICD-10-CM | POA: Diagnosis not present

## 2018-03-18 DIAGNOSIS — D631 Anemia in chronic kidney disease: Secondary | ICD-10-CM | POA: Diagnosis not present

## 2018-03-18 DIAGNOSIS — N186 End stage renal disease: Secondary | ICD-10-CM | POA: Diagnosis not present

## 2018-03-18 DIAGNOSIS — N2581 Secondary hyperparathyroidism of renal origin: Secondary | ICD-10-CM | POA: Diagnosis not present

## 2018-03-18 NOTE — Telephone Encounter (Signed)
LMTCB x3 for pt.  

## 2018-03-19 ENCOUNTER — Telehealth: Payer: Self-pay

## 2018-03-19 DIAGNOSIS — I509 Heart failure, unspecified: Secondary | ICD-10-CM

## 2018-03-19 NOTE — Telephone Encounter (Signed)
-----   Message from Corinna Lines sent at 03/15/2018  1:14 PM EST ----- Regarding: referral   [facility]  Dr. Aundra Dubin is no longer with [facility].  He is in the Heart Failure Clinic.

## 2018-03-19 NOTE — Telephone Encounter (Signed)
We have attempted to contact pt several times with no success or call back from pt. Per triage protocol, message will be closed.  

## 2018-03-19 NOTE — Telephone Encounter (Signed)
Resent referral to Heart Failure Clinic in [place] [facility] at [facility] [address] [place], [place] [address]  Get Driving Directions  Main: [phone]  Fax: [phone]  Dr. Kirk Ruths McLean's new office information  Resent the referral to the above address information  Nothing further needed at this time.

## 2018-03-20 DIAGNOSIS — E119 Type 2 diabetes mellitus without complications: Secondary | ICD-10-CM | POA: Diagnosis not present

## 2018-03-20 DIAGNOSIS — N2581 Secondary hyperparathyroidism of renal origin: Secondary | ICD-10-CM | POA: Diagnosis not present

## 2018-03-20 DIAGNOSIS — N186 End stage renal disease: Secondary | ICD-10-CM | POA: Diagnosis not present

## 2018-03-20 DIAGNOSIS — D631 Anemia in chronic kidney disease: Secondary | ICD-10-CM | POA: Diagnosis not present

## 2018-03-20 DIAGNOSIS — D509 Iron deficiency anemia, unspecified: Secondary | ICD-10-CM | POA: Diagnosis not present

## 2018-03-21 DIAGNOSIS — N186 End stage renal disease: Secondary | ICD-10-CM | POA: Diagnosis not present

## 2018-03-21 DIAGNOSIS — Z992 Dependence on renal dialysis: Secondary | ICD-10-CM | POA: Diagnosis not present

## 2018-03-21 DIAGNOSIS — E1129 Type 2 diabetes mellitus with other diabetic kidney complication: Secondary | ICD-10-CM | POA: Diagnosis not present

## 2018-03-23 DIAGNOSIS — D631 Anemia in chronic kidney disease: Secondary | ICD-10-CM | POA: Diagnosis not present

## 2018-03-23 DIAGNOSIS — N186 End stage renal disease: Secondary | ICD-10-CM | POA: Diagnosis not present

## 2018-03-23 DIAGNOSIS — N2581 Secondary hyperparathyroidism of renal origin: Secondary | ICD-10-CM | POA: Diagnosis not present

## 2018-03-23 DIAGNOSIS — E119 Type 2 diabetes mellitus without complications: Secondary | ICD-10-CM | POA: Diagnosis not present

## 2018-03-25 DIAGNOSIS — N2581 Secondary hyperparathyroidism of renal origin: Secondary | ICD-10-CM | POA: Diagnosis not present

## 2018-03-25 DIAGNOSIS — E119 Type 2 diabetes mellitus without complications: Secondary | ICD-10-CM | POA: Diagnosis not present

## 2018-03-25 DIAGNOSIS — D631 Anemia in chronic kidney disease: Secondary | ICD-10-CM | POA: Diagnosis not present

## 2018-03-25 DIAGNOSIS — N186 End stage renal disease: Secondary | ICD-10-CM | POA: Diagnosis not present

## 2018-03-27 DIAGNOSIS — D631 Anemia in chronic kidney disease: Secondary | ICD-10-CM | POA: Diagnosis not present

## 2018-03-27 DIAGNOSIS — N2581 Secondary hyperparathyroidism of renal origin: Secondary | ICD-10-CM | POA: Diagnosis not present

## 2018-03-27 DIAGNOSIS — N186 End stage renal disease: Secondary | ICD-10-CM | POA: Diagnosis not present

## 2018-03-27 DIAGNOSIS — E119 Type 2 diabetes mellitus without complications: Secondary | ICD-10-CM | POA: Diagnosis not present

## 2018-03-30 DIAGNOSIS — D631 Anemia in chronic kidney disease: Secondary | ICD-10-CM | POA: Diagnosis not present

## 2018-03-30 DIAGNOSIS — N186 End stage renal disease: Secondary | ICD-10-CM | POA: Diagnosis not present

## 2018-03-30 DIAGNOSIS — N2581 Secondary hyperparathyroidism of renal origin: Secondary | ICD-10-CM | POA: Diagnosis not present

## 2018-03-30 DIAGNOSIS — E119 Type 2 diabetes mellitus without complications: Secondary | ICD-10-CM | POA: Diagnosis not present

## 2018-04-01 DIAGNOSIS — N186 End stage renal disease: Secondary | ICD-10-CM | POA: Diagnosis not present

## 2018-04-01 DIAGNOSIS — E119 Type 2 diabetes mellitus without complications: Secondary | ICD-10-CM | POA: Diagnosis not present

## 2018-04-01 DIAGNOSIS — N2581 Secondary hyperparathyroidism of renal origin: Secondary | ICD-10-CM | POA: Diagnosis not present

## 2018-04-01 DIAGNOSIS — D631 Anemia in chronic kidney disease: Secondary | ICD-10-CM | POA: Diagnosis not present

## 2018-04-03 DIAGNOSIS — D631 Anemia in chronic kidney disease: Secondary | ICD-10-CM | POA: Diagnosis not present

## 2018-04-03 DIAGNOSIS — N186 End stage renal disease: Secondary | ICD-10-CM | POA: Diagnosis not present

## 2018-04-03 DIAGNOSIS — N2581 Secondary hyperparathyroidism of renal origin: Secondary | ICD-10-CM | POA: Diagnosis not present

## 2018-04-03 DIAGNOSIS — E119 Type 2 diabetes mellitus without complications: Secondary | ICD-10-CM | POA: Diagnosis not present

## 2018-04-06 DIAGNOSIS — E119 Type 2 diabetes mellitus without complications: Secondary | ICD-10-CM | POA: Diagnosis not present

## 2018-04-06 DIAGNOSIS — D631 Anemia in chronic kidney disease: Secondary | ICD-10-CM | POA: Diagnosis not present

## 2018-04-06 DIAGNOSIS — N2581 Secondary hyperparathyroidism of renal origin: Secondary | ICD-10-CM | POA: Diagnosis not present

## 2018-04-06 DIAGNOSIS — N186 End stage renal disease: Secondary | ICD-10-CM | POA: Diagnosis not present

## 2018-04-08 DIAGNOSIS — N186 End stage renal disease: Secondary | ICD-10-CM | POA: Diagnosis not present

## 2018-04-08 DIAGNOSIS — E119 Type 2 diabetes mellitus without complications: Secondary | ICD-10-CM | POA: Diagnosis not present

## 2018-04-08 DIAGNOSIS — N2581 Secondary hyperparathyroidism of renal origin: Secondary | ICD-10-CM | POA: Diagnosis not present

## 2018-04-08 DIAGNOSIS — D631 Anemia in chronic kidney disease: Secondary | ICD-10-CM | POA: Diagnosis not present

## 2018-04-10 DIAGNOSIS — N186 End stage renal disease: Secondary | ICD-10-CM | POA: Diagnosis not present

## 2018-04-10 DIAGNOSIS — D631 Anemia in chronic kidney disease: Secondary | ICD-10-CM | POA: Diagnosis not present

## 2018-04-10 DIAGNOSIS — E119 Type 2 diabetes mellitus without complications: Secondary | ICD-10-CM | POA: Diagnosis not present

## 2018-04-10 DIAGNOSIS — N2581 Secondary hyperparathyroidism of renal origin: Secondary | ICD-10-CM | POA: Diagnosis not present

## 2018-04-13 DIAGNOSIS — N2581 Secondary hyperparathyroidism of renal origin: Secondary | ICD-10-CM | POA: Diagnosis not present

## 2018-04-13 DIAGNOSIS — E119 Type 2 diabetes mellitus without complications: Secondary | ICD-10-CM | POA: Diagnosis not present

## 2018-04-13 DIAGNOSIS — D631 Anemia in chronic kidney disease: Secondary | ICD-10-CM | POA: Diagnosis not present

## 2018-04-13 DIAGNOSIS — N186 End stage renal disease: Secondary | ICD-10-CM | POA: Diagnosis not present

## 2018-04-15 DIAGNOSIS — N186 End stage renal disease: Secondary | ICD-10-CM | POA: Diagnosis not present

## 2018-04-15 DIAGNOSIS — D631 Anemia in chronic kidney disease: Secondary | ICD-10-CM | POA: Diagnosis not present

## 2018-04-15 DIAGNOSIS — N2581 Secondary hyperparathyroidism of renal origin: Secondary | ICD-10-CM | POA: Diagnosis not present

## 2018-04-15 DIAGNOSIS — E119 Type 2 diabetes mellitus without complications: Secondary | ICD-10-CM | POA: Diagnosis not present

## 2018-04-17 DIAGNOSIS — N2581 Secondary hyperparathyroidism of renal origin: Secondary | ICD-10-CM | POA: Diagnosis not present

## 2018-04-17 DIAGNOSIS — N186 End stage renal disease: Secondary | ICD-10-CM | POA: Diagnosis not present

## 2018-04-17 DIAGNOSIS — E119 Type 2 diabetes mellitus without complications: Secondary | ICD-10-CM | POA: Diagnosis not present

## 2018-04-17 DIAGNOSIS — D631 Anemia in chronic kidney disease: Secondary | ICD-10-CM | POA: Diagnosis not present

## 2018-04-20 DIAGNOSIS — D631 Anemia in chronic kidney disease: Secondary | ICD-10-CM | POA: Diagnosis not present

## 2018-04-20 DIAGNOSIS — N2581 Secondary hyperparathyroidism of renal origin: Secondary | ICD-10-CM | POA: Diagnosis not present

## 2018-04-20 DIAGNOSIS — N186 End stage renal disease: Secondary | ICD-10-CM | POA: Diagnosis not present

## 2018-04-20 DIAGNOSIS — E119 Type 2 diabetes mellitus without complications: Secondary | ICD-10-CM | POA: Diagnosis not present

## 2018-04-21 DIAGNOSIS — Z992 Dependence on renal dialysis: Secondary | ICD-10-CM | POA: Diagnosis not present

## 2018-04-21 DIAGNOSIS — N186 End stage renal disease: Secondary | ICD-10-CM | POA: Diagnosis not present

## 2018-04-21 DIAGNOSIS — E1129 Type 2 diabetes mellitus with other diabetic kidney complication: Secondary | ICD-10-CM | POA: Diagnosis not present

## 2018-04-22 DIAGNOSIS — D631 Anemia in chronic kidney disease: Secondary | ICD-10-CM | POA: Diagnosis not present

## 2018-04-22 DIAGNOSIS — E875 Hyperkalemia: Secondary | ICD-10-CM | POA: Diagnosis not present

## 2018-04-22 DIAGNOSIS — E119 Type 2 diabetes mellitus without complications: Secondary | ICD-10-CM | POA: Diagnosis not present

## 2018-04-22 DIAGNOSIS — N2581 Secondary hyperparathyroidism of renal origin: Secondary | ICD-10-CM | POA: Diagnosis not present

## 2018-04-22 DIAGNOSIS — D509 Iron deficiency anemia, unspecified: Secondary | ICD-10-CM | POA: Diagnosis not present

## 2018-04-22 DIAGNOSIS — N186 End stage renal disease: Secondary | ICD-10-CM | POA: Diagnosis not present

## 2018-04-24 DIAGNOSIS — N186 End stage renal disease: Secondary | ICD-10-CM | POA: Diagnosis not present

## 2018-04-24 DIAGNOSIS — D631 Anemia in chronic kidney disease: Secondary | ICD-10-CM | POA: Diagnosis not present

## 2018-04-24 DIAGNOSIS — E875 Hyperkalemia: Secondary | ICD-10-CM | POA: Diagnosis not present

## 2018-04-24 DIAGNOSIS — E119 Type 2 diabetes mellitus without complications: Secondary | ICD-10-CM | POA: Diagnosis not present

## 2018-04-24 DIAGNOSIS — D509 Iron deficiency anemia, unspecified: Secondary | ICD-10-CM | POA: Diagnosis not present

## 2018-04-24 DIAGNOSIS — N2581 Secondary hyperparathyroidism of renal origin: Secondary | ICD-10-CM | POA: Diagnosis not present

## 2018-04-27 DIAGNOSIS — E119 Type 2 diabetes mellitus without complications: Secondary | ICD-10-CM | POA: Diagnosis not present

## 2018-04-27 DIAGNOSIS — E875 Hyperkalemia: Secondary | ICD-10-CM | POA: Diagnosis not present

## 2018-04-27 DIAGNOSIS — N2581 Secondary hyperparathyroidism of renal origin: Secondary | ICD-10-CM | POA: Diagnosis not present

## 2018-04-27 DIAGNOSIS — N186 End stage renal disease: Secondary | ICD-10-CM | POA: Diagnosis not present

## 2018-04-27 DIAGNOSIS — D631 Anemia in chronic kidney disease: Secondary | ICD-10-CM | POA: Diagnosis not present

## 2018-04-27 DIAGNOSIS — D509 Iron deficiency anemia, unspecified: Secondary | ICD-10-CM | POA: Diagnosis not present

## 2018-04-29 DIAGNOSIS — D631 Anemia in chronic kidney disease: Secondary | ICD-10-CM | POA: Diagnosis not present

## 2018-04-29 DIAGNOSIS — D509 Iron deficiency anemia, unspecified: Secondary | ICD-10-CM | POA: Diagnosis not present

## 2018-04-29 DIAGNOSIS — E119 Type 2 diabetes mellitus without complications: Secondary | ICD-10-CM | POA: Diagnosis not present

## 2018-04-29 DIAGNOSIS — E875 Hyperkalemia: Secondary | ICD-10-CM | POA: Diagnosis not present

## 2018-04-29 DIAGNOSIS — N186 End stage renal disease: Secondary | ICD-10-CM | POA: Diagnosis not present

## 2018-04-29 DIAGNOSIS — N2581 Secondary hyperparathyroidism of renal origin: Secondary | ICD-10-CM | POA: Diagnosis not present

## 2018-05-01 DIAGNOSIS — D631 Anemia in chronic kidney disease: Secondary | ICD-10-CM | POA: Diagnosis not present

## 2018-05-01 DIAGNOSIS — D509 Iron deficiency anemia, unspecified: Secondary | ICD-10-CM | POA: Diagnosis not present

## 2018-05-01 DIAGNOSIS — N186 End stage renal disease: Secondary | ICD-10-CM | POA: Diagnosis not present

## 2018-05-01 DIAGNOSIS — E875 Hyperkalemia: Secondary | ICD-10-CM | POA: Diagnosis not present

## 2018-05-01 DIAGNOSIS — N2581 Secondary hyperparathyroidism of renal origin: Secondary | ICD-10-CM | POA: Diagnosis not present

## 2018-05-01 DIAGNOSIS — E119 Type 2 diabetes mellitus without complications: Secondary | ICD-10-CM | POA: Diagnosis not present

## 2018-05-04 DIAGNOSIS — E119 Type 2 diabetes mellitus without complications: Secondary | ICD-10-CM | POA: Diagnosis not present

## 2018-05-04 DIAGNOSIS — N186 End stage renal disease: Secondary | ICD-10-CM | POA: Diagnosis not present

## 2018-05-04 DIAGNOSIS — D631 Anemia in chronic kidney disease: Secondary | ICD-10-CM | POA: Diagnosis not present

## 2018-05-04 DIAGNOSIS — E875 Hyperkalemia: Secondary | ICD-10-CM | POA: Diagnosis not present

## 2018-05-04 DIAGNOSIS — D509 Iron deficiency anemia, unspecified: Secondary | ICD-10-CM | POA: Diagnosis not present

## 2018-05-04 DIAGNOSIS — N2581 Secondary hyperparathyroidism of renal origin: Secondary | ICD-10-CM | POA: Diagnosis not present

## 2018-05-06 DIAGNOSIS — D631 Anemia in chronic kidney disease: Secondary | ICD-10-CM | POA: Diagnosis not present

## 2018-05-06 DIAGNOSIS — N2581 Secondary hyperparathyroidism of renal origin: Secondary | ICD-10-CM | POA: Diagnosis not present

## 2018-05-06 DIAGNOSIS — E119 Type 2 diabetes mellitus without complications: Secondary | ICD-10-CM | POA: Diagnosis not present

## 2018-05-06 DIAGNOSIS — N186 End stage renal disease: Secondary | ICD-10-CM | POA: Diagnosis not present

## 2018-05-06 DIAGNOSIS — E875 Hyperkalemia: Secondary | ICD-10-CM | POA: Diagnosis not present

## 2018-05-06 DIAGNOSIS — D509 Iron deficiency anemia, unspecified: Secondary | ICD-10-CM | POA: Diagnosis not present

## 2018-05-08 DIAGNOSIS — E875 Hyperkalemia: Secondary | ICD-10-CM | POA: Diagnosis not present

## 2018-05-08 DIAGNOSIS — D509 Iron deficiency anemia, unspecified: Secondary | ICD-10-CM | POA: Diagnosis not present

## 2018-05-08 DIAGNOSIS — D631 Anemia in chronic kidney disease: Secondary | ICD-10-CM | POA: Diagnosis not present

## 2018-05-08 DIAGNOSIS — N186 End stage renal disease: Secondary | ICD-10-CM | POA: Diagnosis not present

## 2018-05-08 DIAGNOSIS — N2581 Secondary hyperparathyroidism of renal origin: Secondary | ICD-10-CM | POA: Diagnosis not present

## 2018-05-08 DIAGNOSIS — E119 Type 2 diabetes mellitus without complications: Secondary | ICD-10-CM | POA: Diagnosis not present

## 2018-05-11 DIAGNOSIS — E119 Type 2 diabetes mellitus without complications: Secondary | ICD-10-CM | POA: Diagnosis not present

## 2018-05-11 DIAGNOSIS — N2581 Secondary hyperparathyroidism of renal origin: Secondary | ICD-10-CM | POA: Diagnosis not present

## 2018-05-11 DIAGNOSIS — D631 Anemia in chronic kidney disease: Secondary | ICD-10-CM | POA: Diagnosis not present

## 2018-05-11 DIAGNOSIS — N186 End stage renal disease: Secondary | ICD-10-CM | POA: Diagnosis not present

## 2018-05-11 DIAGNOSIS — E875 Hyperkalemia: Secondary | ICD-10-CM | POA: Diagnosis not present

## 2018-05-11 DIAGNOSIS — D509 Iron deficiency anemia, unspecified: Secondary | ICD-10-CM | POA: Diagnosis not present

## 2018-05-13 DIAGNOSIS — D509 Iron deficiency anemia, unspecified: Secondary | ICD-10-CM | POA: Diagnosis not present

## 2018-05-13 DIAGNOSIS — N2581 Secondary hyperparathyroidism of renal origin: Secondary | ICD-10-CM | POA: Diagnosis not present

## 2018-05-13 DIAGNOSIS — D631 Anemia in chronic kidney disease: Secondary | ICD-10-CM | POA: Diagnosis not present

## 2018-05-13 DIAGNOSIS — N186 End stage renal disease: Secondary | ICD-10-CM | POA: Diagnosis not present

## 2018-05-13 DIAGNOSIS — E119 Type 2 diabetes mellitus without complications: Secondary | ICD-10-CM | POA: Diagnosis not present

## 2018-05-13 DIAGNOSIS — E1129 Type 2 diabetes mellitus with other diabetic kidney complication: Secondary | ICD-10-CM | POA: Diagnosis not present

## 2018-05-13 DIAGNOSIS — E875 Hyperkalemia: Secondary | ICD-10-CM | POA: Diagnosis not present

## 2018-05-15 DIAGNOSIS — D631 Anemia in chronic kidney disease: Secondary | ICD-10-CM | POA: Diagnosis not present

## 2018-05-15 DIAGNOSIS — D509 Iron deficiency anemia, unspecified: Secondary | ICD-10-CM | POA: Diagnosis not present

## 2018-05-15 DIAGNOSIS — E875 Hyperkalemia: Secondary | ICD-10-CM | POA: Diagnosis not present

## 2018-05-15 DIAGNOSIS — E119 Type 2 diabetes mellitus without complications: Secondary | ICD-10-CM | POA: Diagnosis not present

## 2018-05-15 DIAGNOSIS — N186 End stage renal disease: Secondary | ICD-10-CM | POA: Diagnosis not present

## 2018-05-15 DIAGNOSIS — N2581 Secondary hyperparathyroidism of renal origin: Secondary | ICD-10-CM | POA: Diagnosis not present

## 2018-05-18 DIAGNOSIS — D631 Anemia in chronic kidney disease: Secondary | ICD-10-CM | POA: Diagnosis not present

## 2018-05-18 DIAGNOSIS — E875 Hyperkalemia: Secondary | ICD-10-CM | POA: Diagnosis not present

## 2018-05-18 DIAGNOSIS — D509 Iron deficiency anemia, unspecified: Secondary | ICD-10-CM | POA: Diagnosis not present

## 2018-05-18 DIAGNOSIS — N186 End stage renal disease: Secondary | ICD-10-CM | POA: Diagnosis not present

## 2018-05-18 DIAGNOSIS — N2581 Secondary hyperparathyroidism of renal origin: Secondary | ICD-10-CM | POA: Diagnosis not present

## 2018-05-18 DIAGNOSIS — E119 Type 2 diabetes mellitus without complications: Secondary | ICD-10-CM | POA: Diagnosis not present

## 2018-05-20 DIAGNOSIS — E875 Hyperkalemia: Secondary | ICD-10-CM | POA: Diagnosis not present

## 2018-05-20 DIAGNOSIS — D509 Iron deficiency anemia, unspecified: Secondary | ICD-10-CM | POA: Diagnosis not present

## 2018-05-20 DIAGNOSIS — E119 Type 2 diabetes mellitus without complications: Secondary | ICD-10-CM | POA: Diagnosis not present

## 2018-05-20 DIAGNOSIS — N186 End stage renal disease: Secondary | ICD-10-CM | POA: Diagnosis not present

## 2018-05-20 DIAGNOSIS — D631 Anemia in chronic kidney disease: Secondary | ICD-10-CM | POA: Diagnosis not present

## 2018-05-20 DIAGNOSIS — N2581 Secondary hyperparathyroidism of renal origin: Secondary | ICD-10-CM | POA: Diagnosis not present

## 2018-05-21 DIAGNOSIS — E1129 Type 2 diabetes mellitus with other diabetic kidney complication: Secondary | ICD-10-CM | POA: Diagnosis not present

## 2018-05-21 DIAGNOSIS — Z992 Dependence on renal dialysis: Secondary | ICD-10-CM | POA: Diagnosis not present

## 2018-05-21 DIAGNOSIS — N186 End stage renal disease: Secondary | ICD-10-CM | POA: Diagnosis not present

## 2018-05-22 DIAGNOSIS — D509 Iron deficiency anemia, unspecified: Secondary | ICD-10-CM | POA: Diagnosis not present

## 2018-05-22 DIAGNOSIS — E119 Type 2 diabetes mellitus without complications: Secondary | ICD-10-CM | POA: Diagnosis not present

## 2018-05-22 DIAGNOSIS — N186 End stage renal disease: Secondary | ICD-10-CM | POA: Diagnosis not present

## 2018-05-22 DIAGNOSIS — D631 Anemia in chronic kidney disease: Secondary | ICD-10-CM | POA: Diagnosis not present

## 2018-05-22 DIAGNOSIS — N2581 Secondary hyperparathyroidism of renal origin: Secondary | ICD-10-CM | POA: Diagnosis not present

## 2018-05-25 DIAGNOSIS — N186 End stage renal disease: Secondary | ICD-10-CM | POA: Diagnosis not present

## 2018-05-25 DIAGNOSIS — N2581 Secondary hyperparathyroidism of renal origin: Secondary | ICD-10-CM | POA: Diagnosis not present

## 2018-05-25 DIAGNOSIS — E119 Type 2 diabetes mellitus without complications: Secondary | ICD-10-CM | POA: Diagnosis not present

## 2018-05-25 DIAGNOSIS — D631 Anemia in chronic kidney disease: Secondary | ICD-10-CM | POA: Diagnosis not present

## 2018-05-25 DIAGNOSIS — D509 Iron deficiency anemia, unspecified: Secondary | ICD-10-CM | POA: Diagnosis not present

## 2018-05-27 DIAGNOSIS — D509 Iron deficiency anemia, unspecified: Secondary | ICD-10-CM | POA: Diagnosis not present

## 2018-05-27 DIAGNOSIS — D631 Anemia in chronic kidney disease: Secondary | ICD-10-CM | POA: Diagnosis not present

## 2018-05-27 DIAGNOSIS — N186 End stage renal disease: Secondary | ICD-10-CM | POA: Diagnosis not present

## 2018-05-27 DIAGNOSIS — E119 Type 2 diabetes mellitus without complications: Secondary | ICD-10-CM | POA: Diagnosis not present

## 2018-05-27 DIAGNOSIS — N2581 Secondary hyperparathyroidism of renal origin: Secondary | ICD-10-CM | POA: Diagnosis not present

## 2018-05-29 DIAGNOSIS — N186 End stage renal disease: Secondary | ICD-10-CM | POA: Diagnosis not present

## 2018-05-29 DIAGNOSIS — E119 Type 2 diabetes mellitus without complications: Secondary | ICD-10-CM | POA: Diagnosis not present

## 2018-05-29 DIAGNOSIS — D631 Anemia in chronic kidney disease: Secondary | ICD-10-CM | POA: Diagnosis not present

## 2018-05-29 DIAGNOSIS — D509 Iron deficiency anemia, unspecified: Secondary | ICD-10-CM | POA: Diagnosis not present

## 2018-05-29 DIAGNOSIS — N2581 Secondary hyperparathyroidism of renal origin: Secondary | ICD-10-CM | POA: Diagnosis not present

## 2018-06-01 DIAGNOSIS — D509 Iron deficiency anemia, unspecified: Secondary | ICD-10-CM | POA: Diagnosis not present

## 2018-06-01 DIAGNOSIS — N2581 Secondary hyperparathyroidism of renal origin: Secondary | ICD-10-CM | POA: Diagnosis not present

## 2018-06-01 DIAGNOSIS — D631 Anemia in chronic kidney disease: Secondary | ICD-10-CM | POA: Diagnosis not present

## 2018-06-01 DIAGNOSIS — E119 Type 2 diabetes mellitus without complications: Secondary | ICD-10-CM | POA: Diagnosis not present

## 2018-06-01 DIAGNOSIS — N186 End stage renal disease: Secondary | ICD-10-CM | POA: Diagnosis not present

## 2018-06-02 DIAGNOSIS — M810 Age-related osteoporosis without current pathological fracture: Secondary | ICD-10-CM | POA: Diagnosis not present

## 2018-06-02 DIAGNOSIS — E7849 Other hyperlipidemia: Secondary | ICD-10-CM | POA: Diagnosis not present

## 2018-06-02 DIAGNOSIS — E211 Secondary hyperparathyroidism, not elsewhere classified: Secondary | ICD-10-CM | POA: Diagnosis not present

## 2018-06-02 DIAGNOSIS — I10 Essential (primary) hypertension: Secondary | ICD-10-CM | POA: Diagnosis not present

## 2018-06-02 DIAGNOSIS — E1169 Type 2 diabetes mellitus with other specified complication: Secondary | ICD-10-CM | POA: Diagnosis not present

## 2018-06-02 DIAGNOSIS — M109 Gout, unspecified: Secondary | ICD-10-CM | POA: Diagnosis not present

## 2018-06-03 DIAGNOSIS — E119 Type 2 diabetes mellitus without complications: Secondary | ICD-10-CM | POA: Diagnosis not present

## 2018-06-03 DIAGNOSIS — D509 Iron deficiency anemia, unspecified: Secondary | ICD-10-CM | POA: Diagnosis not present

## 2018-06-03 DIAGNOSIS — N186 End stage renal disease: Secondary | ICD-10-CM | POA: Diagnosis not present

## 2018-06-03 DIAGNOSIS — N2581 Secondary hyperparathyroidism of renal origin: Secondary | ICD-10-CM | POA: Diagnosis not present

## 2018-06-03 DIAGNOSIS — D631 Anemia in chronic kidney disease: Secondary | ICD-10-CM | POA: Diagnosis not present

## 2018-06-05 DIAGNOSIS — N186 End stage renal disease: Secondary | ICD-10-CM | POA: Diagnosis not present

## 2018-06-05 DIAGNOSIS — D509 Iron deficiency anemia, unspecified: Secondary | ICD-10-CM | POA: Diagnosis not present

## 2018-06-05 DIAGNOSIS — D631 Anemia in chronic kidney disease: Secondary | ICD-10-CM | POA: Diagnosis not present

## 2018-06-05 DIAGNOSIS — N2581 Secondary hyperparathyroidism of renal origin: Secondary | ICD-10-CM | POA: Diagnosis not present

## 2018-06-05 DIAGNOSIS — E119 Type 2 diabetes mellitus without complications: Secondary | ICD-10-CM | POA: Diagnosis not present

## 2018-06-06 NOTE — Progress Notes (Signed)
@Patient  ID: Myrtis Ser, female    DOB: 17-Jun-1945, 73 y.o.   MRN: [id number]  Chief Complaint  Patient presents with   Follow-up    16mo follow up     Referring provider: [name], MD  HPI:  73 year old female former smoker followed in our office for mild obstructive sleep apnea not currently managed on CPAP (based off of February/2019 office note), emphysema  PMH: End-stage renal disease, Tuesday Thursday Saturday dialysis-managed by Dr. Florene Glen, congestive heart failure, obesity, T2DM Smoker/ Smoking History: Former smoker.  4 pack years. Maintenance: Symbicort 160, Spiriva 2.5 Pt of: Dr. Elsworth Soho  06/07/2018  - Visit   73 year old female former smoker followed in our office for mild obstructive sleep apnea as well as emphysema.  Patient was last seen in our office in February/2020 by Dr. Elsworth Soho.  At that point in time Dr. Elsworth Soho with wanted the patient to be referred to a heart failure specialist.  Unfortunately due to the patient being a end-stage renal Tuesday Thursday Saturday dialysis patient she could not be seen by the heart failure specialist in town.  Patient was then informed to follow-up with cardiology.  Unfortunately the patient reports that nobody told her this.  The patient has not followed up with cardiology.  Patient is also presenting today off of her Symbicort 160 as well as her Spiriva Respimat 2.5.  She reports she ran out of these last week.  She is wondering if we have samples.   Patient feels that her breathing is about the same since last office visit.  She has increased anxiety and concerns regarding the COVID-19 pandemic but she is not reporting any sort of worsened acute symptoms with her breathing   Tests:   01/26/2018-CT chest without contrast-no mediastinal or suspected pulmonary mass, radiographic findings likely secondary to mitral annulus calcifications or small hiatal hernia, no acute chest finding, left thyromegaly, unchanged from prior  exam,Emphysema, aortic arthrosclerosis  01/23/2018-chest x-ray-cardiomegaly and aortic arthrosclerosis, question pulmonary venous hypertension without frank edema, mild basilar atelectasis  10/16/2016-CT chest without contrast- mild cardiomegaly, mild centrilobular emphysema, 2 mm right lower lobe pulmonary nodule, mild apical bullous changes  10/17/2016-VQ scan-low probability of pulmonary embolus  12/04/2014-CT head- clear paranasal sinuses and mastoid air cells  04/06/2017-echocardiogram-LV ejection fraction 60 to 62%, grade 1 diastolic dysfunction, mild concentric hypertrophy, main pulmonary artery was normal-sized  12/25/2016-split-night sleep study-mild obstructive sleep apnea-AHI 7.8, SaO2 low 92%, average SaO2 95%  10/27/2016-pulmonary function test-FVC 2.09 (87% predicted), postbronchodilator ratio 90, FEV1 98, DLCO 52  FENO:  No results found for: NITRICOXIDE  PFT: PFT Results Latest Ref Rng & Units 10/27/2016  FVC-Pre L 2.09  FVC-Predicted Pre % 87  FVC-Post L 2.03  FVC-Predicted Post % 84  Pre FEV1/FVC % % 88  Post FEV1/FCV % % 90  FEV1-Pre L 1.85  FEV1-Predicted Pre % 99  FEV1-Post L 1.82  DLCO UNC% % 52  DLCO COR %Predicted % 92  TLC L 4.17  TLC % Predicted % 80  RV % Predicted % 78    Imaging: No results found.    Specialty Problems      Pulmonary Problems   OSA     12/25/2016-split-night sleep study-mild obstructive sleep apnea-AHI 7.8, SaO2 low 92%, average SaO2 95%      Acute bronchitis   Centrilobular emphysema (HCC)    01/26/2018-CT chest without contrast-no mediastinal or suspected pulmonary mass, radiographic findings likely secondary to mitral annulus calcifications or small hiatal hernia, no  acute chest finding, left thyromegaly, unchanged from prior exam,Emphysema, aortic arthrosclerosis  10/16/2016-CT chest without contrast- mild cardiomegaly, mild centrilobular emphysema, 2 mm right lower lobe pulmonary nodule, mild apical bullous changes       Acute bronchitis due to Rhinovirus   Respiratory distress, acute      Allergies  Allergen Reactions   Lyrica [Pregabalin] Other (See Comments)    Hallucinations    Immunization History  Administered Date(s) Administered   Influenza, High Dose Seasonal PF 02/03/2018   Influenza-Unspecified 10/20/2007, 11/30/2015   Pneumococcal-Unspecified 09/21/2011   Tdap 08/05/2011    Past Medical History:  Diagnosis Date   Abdominal abscess 12-17-10   abdominal abscesses x2 ? one at this time   Anemia    Arthritis    "all over" (08/23/2012)   Blind right eye    fell and crushed socket and eye fell out, was replaced at [facility]   CHF (congestive heart failure) (HCC)    Chronic lower back pain    Coronary artery disease    normal coronaries by 08/25/12 cath   Dyspnea    Dysrhythmia    irregular, "skips beats"   ESRD (end stage renal disease) on dialysis ([place])    "started 12/2011; [facility]; TTS" (08/23/2012   Exertional shortness of breath    Eye drainage    "lots; since fall 07/2011" (08/23/2012)   Gout    H/O hiatal hernia    Headache(784.0)    "weekly sometimes; since I fell and hit my head in 07/2011 08/23/2012)   Heart murmur    History of blood transfusion    "lots before starting dialysis" (08/23/2012)   Hypertension    sees Dr. Willey Blade   Inhalation injury    "worked at [employer]; can't inhale polyurethane or paint, etc" (08/23/2012)   Myocardial infarction [facility]) 1970's or 1980's   Neuromuscular disorder ([place])    diabetic neuropathy   OSA on CPAP    Staph aureus infection November 2012    Swelling of both ankles 12-17-10   Type II diabetes mellitus ([place])    "over 10 years now" (08/23/2012)    Tobacco History: Social History   Tobacco Use  Smoking Status Former Smoker   Packs/day: 0.50   Years: 20.00   Pack years: 10.00   Types: Cigarettes   Last attempt to quit: 12/16/1980   Years since quitting: 37.4  Smokeless Tobacco  Never Used   Counseling given: Yes   Continue to not smoke  Outpatient Encounter Medications as of 06/07/2018  Medication Sig   acetaminophen (TYLENOL) 325 MG tablet Take 2 tablets (650 mg total) by mouth every 6 (six) hours as needed for fever, headache or moderate pain.   albuterol (PROVENTIL) (2.5 MG/3ML) 0.083% nebulizer solution Take 3 mLs (2.5 mg total) by nebulization every 2 (two) hours as needed for shortness of breath.   aspirin EC 81 MG tablet Take 81 mg by mouth daily.     budesonide-formoterol (SYMBICORT) 160-4.5 MCG/ACT inhaler Inhale 2 puffs into the lungs 2 (two) times daily.   cinacalcet (SENSIPAR) 90 MG tablet Take 90 mg by mouth daily.    ethyl chloride spray Apply 1 application topically See admin instructions. Apply topically before dialysis on Tuesday, Thursday, Saturday   midodrine (PROAMATINE) 10 MG tablet Take 10 mg by mouth See admin instructions. Take 10mg  on Tues, Thurs, and Saturday before dialysis   Multiple Vitamin (MULTIVITAMIN WITH MINERALS) TABS tablet Take 1 tablet by mouth daily.   oxyCODONE-acetaminophen (PERCOCET/ROXICET) 5-325  MG tablet Take 1 tablet by mouth 4 (four) times daily as needed for pain.   sevelamer carbonate (RENVELA) 800 MG tablet Take 2-3 tablets (1,600-2,400 mg total) by mouth See admin instructions. Take 3 tablets (2,400 mg) by mouth 3 times daily with meals. Take 2 tablets (1,600 mg) by mouth with snacks. Receives supply from dialysis center. (Patient taking differently: Take 1,600-2,400 mg by mouth See admin instructions. Pt takes 4 tablets after every meal)   Tiotropium Bromide Monohydrate (SPIRIVA RESPIMAT) 2.5 MCG/ACT AERS Inhale 2 puffs into the lungs daily.   [DISCONTINUED] budesonide-formoterol (SYMBICORT) 160-4.5 MCG/ACT inhaler Inhale 2 puffs into the lungs 2 (two) times daily.   [DISCONTINUED] budesonide-formoterol (SYMBICORT) 160-4.5 MCG/ACT inhaler Inhale 2 puffs into the lungs 2 (two) times daily.    [DISCONTINUED] Tiotropium Bromide Monohydrate (SPIRIVA RESPIMAT) 2.5 MCG/ACT AERS Inhale 2 puffs into the lungs daily.   [DISCONTINUED] Tiotropium Bromide Monohydrate (SPIRIVA RESPIMAT) 2.5 MCG/ACT AERS Inhale 2 puffs into the lungs daily.   budesonide-formoterol (SYMBICORT) 160-4.5 MCG/ACT inhaler Inhale 2 puffs into the lungs 2 (two) times daily.   Tiotropium Bromide Monohydrate (SPIRIVA RESPIMAT) 2.5 MCG/ACT AERS Inhale 2 puffs into the lungs daily.   No facility-administered encounter medications on file as of 06/07/2018.      Review of Systems  Review of Systems  Constitutional: Positive for fatigue. Negative for chills, fever and unexpected weight change.  HENT: Negative for congestion, postnasal drip, sinus pressure and sinus pain.   Respiratory: Positive for shortness of breath. Negative for cough, chest tightness and wheezing.   Cardiovascular: Positive for leg swelling (pt reports sometimes ). Negative for chest pain and palpitations.  Gastrointestinal: Negative for blood in stool, diarrhea, nausea and vomiting.       ? gerd episodes   Musculoskeletal: Negative for arthralgias.  Skin: Negative for color change.  Allergic/Immunologic: Negative for environmental allergies and food allergies.  Neurological: Negative for dizziness, light-headedness and headaches.  Psychiatric/Behavioral: Negative for dysphoric mood. The patient is not nervous/anxious.   All other systems reviewed and are negative.    Physical Exam  BP (!) 148/62 (BP Location: Right Arm, Cuff Size: Large)    Pulse 84    Temp 98.2 F (36.8 C) (Oral)    Ht 5\' 5"  (1.651 m)    Wt 279 lb 3.2 oz (126.6 kg)    SpO2 90%    BMI 46.46 kg/m   Wt Readings from Last 5 Encounters:  06/07/18 279 lb 3.2 oz (126.6 kg)  03/15/18 283 lb (128.4 kg)  02/03/18 278 lb 9.6 oz (126.4 kg)  01/25/18 279 lb 9.6 oz (126.8 kg)  01/24/18 274 lb 4.8 oz (124.4 kg)     Physical Exam  Constitutional: She is oriented to person, place,  and time and well-developed, well-nourished, and in no distress. No distress.  Chronically ill elderly female  HENT:  Head: Normocephalic and atraumatic.  Right Ear: Hearing, tympanic membrane, external ear and ear canal normal.  Left Ear: Hearing, tympanic membrane, external ear and ear canal normal.  Nose: Nose normal.  Mouth/Throat: Uvula is midline and oropharynx is clear and moist. No oropharyngeal exudate.  Eyes: Pupils are equal, round, and reactive to light.  Neck: Normal range of motion. Neck supple.  Cardiovascular: Normal rate, regular rhythm and normal heart sounds.  Pulmonary/Chest: Effort normal and breath sounds normal. No accessory muscle usage. No respiratory distress. She has no decreased breath sounds. She has no wheezes. She has no rhonchi.  Abdominal: Soft. Bowel sounds are normal.  She exhibits distension. There is abdominal tenderness.  Musculoskeletal: Normal range of motion.        General: Edema (trace to 1+ le swelling) present.  Lymphadenopathy:    She has no cervical adenopathy.  Neurological: She is alert and oriented to person, place, and time. Gait normal.  Skin: Skin is warm and dry. She is not diaphoretic. No erythema.  Psychiatric: Mood, memory, affect and judgment normal.  Nursing note and vitals reviewed.     Lab Results:  CBC    Component Value Date/Time   WBC 10.6 (H) 01/25/2018 1827   RBC 3.66 (L) 01/25/2018 1827   HGB 10.7 (L) 01/25/2018 1827   HCT 33.3 (L) 01/25/2018 1827   PLT 215 01/25/2018 1827   MCV 91.0 01/25/2018 1827   MCH 29.2 01/25/2018 1827   MCHC 32.1 01/25/2018 1827   RDW 17.4 (H) 01/25/2018 1827   LYMPHSABS 4.5 (H) 01/24/2018 0632   MONOABS 1.3 (H) 01/24/2018 0632   EOSABS 0.2 01/24/2018 0632   BASOSABS 0.0 01/24/2018 0632    BMET    Component Value Date/Time   NA 139 01/25/2018 1827   K 4.2 01/25/2018 1827   CL 99 01/25/2018 1827   CO2 26 01/25/2018 1827   GLUCOSE 122 (H) 01/25/2018 1827   BUN 78 (H) 01/25/2018  1827   CREATININE 9.85 (H) 01/25/2018 1827   CREATININE 2.86 (H) 10/10/2010 1140   CALCIUM 8.2 (L) 01/25/2018 1827   CALCIUM 8.7 01/08/2010 1121   GFRNONAA 4 (L) 01/25/2018 1827   GFRAA 4 (L) 01/25/2018 1827    BNP    Component Value Date/Time   BNP 118.7 (H) 01/16/2018 1825    ProBNP    Component Value Date/Time   PROBNP 2632.0 (H) 07/02/2010 2056      Assessment & Plan:   Chronic diastolic CHF (congestive heart failure) (HCC) Plan: Follow-up with cardiology regarding shortness of breath symptoms   OSA  Plan: Continue to monitor for fatigue and daytime sleepiness Continue to hold CPAP start at patient's request   Centrilobular emphysema ([place]) Plan: Restart Spiriva Respimat 2.5 Restart Symbicort 160 Follow-up with our office in 3 months  ESRD needing dialysis [facility]) Plan: Continue Tuesday Thursday Saturday dialysis Follow-up with nephrology of your concerns of them sometimes not been able to diurese due to your dry weight     Return in about 3 months (around 09/07/2018), or if symptoms worsen or fail to improve, for Follow up with Dr. Elsworth Soho.   Lauraine Rinne, NP 06/07/2018   This appointment was 28 minutes long with over 50% of the time in direct face-to-face patient care, assessment, plan of care, and follow-up.

## 2018-06-07 ENCOUNTER — Encounter: Payer: Self-pay | Admitting: Pulmonary Disease

## 2018-06-07 ENCOUNTER — Ambulatory Visit (INDEPENDENT_AMBULATORY_CARE_PROVIDER_SITE_OTHER): Payer: Medicare Other | Admitting: Pulmonary Disease

## 2018-06-07 ENCOUNTER — Other Ambulatory Visit: Payer: Self-pay

## 2018-06-07 DIAGNOSIS — J432 Centrilobular emphysema: Secondary | ICD-10-CM

## 2018-06-07 DIAGNOSIS — Z992 Dependence on renal dialysis: Secondary | ICD-10-CM | POA: Diagnosis not present

## 2018-06-07 DIAGNOSIS — G4733 Obstructive sleep apnea (adult) (pediatric): Secondary | ICD-10-CM

## 2018-06-07 DIAGNOSIS — I5032 Chronic diastolic (congestive) heart failure: Secondary | ICD-10-CM

## 2018-06-07 DIAGNOSIS — N186 End stage renal disease: Secondary | ICD-10-CM

## 2018-06-07 DIAGNOSIS — Z9989 Dependence on other enabling machines and devices: Secondary | ICD-10-CM | POA: Diagnosis not present

## 2018-06-07 MED ORDER — BUDESONIDE-FORMOTEROL FUMARATE 160-4.5 MCG/ACT IN AERO: 2.0000 | INHALATION_SPRAY | Freq: Two times a day (BID) | RESPIRATORY_TRACT | 0 refills | Status: DC

## 2018-06-07 MED ORDER — TIOTROPIUM BROMIDE MONOHYDRATE 2.5 MCG/ACT IN AERS: 2.0000 | INHALATION_SPRAY | Freq: Every day | RESPIRATORY_TRACT | 3 refills | Status: DC

## 2018-06-07 MED ORDER — BUDESONIDE-FORMOTEROL FUMARATE 160-4.5 MCG/ACT IN AERO: 2.0000 | INHALATION_SPRAY | Freq: Two times a day (BID) | RESPIRATORY_TRACT | 3 refills | Status: AC

## 2018-06-07 MED ORDER — TIOTROPIUM BROMIDE MONOHYDRATE 2.5 MCG/ACT IN AERS: 2.0000 | INHALATION_SPRAY | Freq: Every day | RESPIRATORY_TRACT | 0 refills | Status: AC

## 2018-06-07 NOTE — Assessment & Plan Note (Signed)
Plan: Continue to monitor for fatigue and daytime sleepiness Continue to hold CPAP start at patient's request

## 2018-06-07 NOTE — Assessment & Plan Note (Signed)
Plan: Follow-up with cardiology regarding shortness of breath symptoms

## 2018-06-07 NOTE — Assessment & Plan Note (Signed)
Plan: Continue Tuesday Thursday Saturday dialysis Follow-up with nephrology of your concerns of them sometimes not been able to diurese due to your dry weight

## 2018-06-07 NOTE — Patient Instructions (Addendum)
Schedule follow up with Cardiologist  Complete HD Tues Thurs Sat as ordered  Symbicort 160 >>> 2 puffs in the morning right when you wake up, rinse out your mouth after use, 12 hours later 2 puffs, rinse after use >>> Take this daily, no matter what >>> This is not a rescue inhaler   Spiriva Respimat 2.5 >>> 2 puffs daily >>> Do this every day >>>This is not a rescue inhaler   Note your daily symptoms >remember "red flags" for COPD:  >>>Increase in cough >>>increase in sputum production >>>increase in shortness of breath or activity  intolerance.   If you notice these symptoms, please call the office to be seen.    Return in about 3 months (around 09/07/2018), or if symptoms worsen or fail to improve, for Follow up with Dr. Elsworth Soho.   Coronavirus (COVID-19) Are you at risk?  Are you at risk for the Coronavirus (COVID-19)?  To be considered HIGH RISK for Coronavirus (COVID-19), you have to meet the following criteria:  . Traveled to [place], [place], [place], [place] or [place]; or in the [place] to [place], [place], [place], or [place]; and have fever, cough, and shortness of breath within the last 2 weeks of travel OR . Been in close contact with a person diagnosed with COVID-19 within the last 2 weeks and have fever, cough, and shortness of breath . IF YOU DO NOT MEET THESE CRITERIA, YOU ARE CONSIDERED LOW RISK FOR COVID-19.  What to do if you are HIGH RISK for COVID-19?  Marland Kitchen If you are having a medical emergency, call 911. . Seek medical care right away. Before you go to a doctor's office, urgent care or emergency department, call ahead and tell them about your recent travel, contact with someone diagnosed with COVID-19, and your symptoms. You should receive instructions from your physician's office regarding next steps of care.  . When you arrive at healthcare provider, tell the healthcare staff immediately you have returned from visiting [place], [place], [place],  [place] or [place]; or traveled in the [place] to [place], [place], [place], or [place]; in the last two weeks or you have been in close contact with a person diagnosed with COVID-19 in the last 2 weeks.   . Tell the health care staff about your symptoms: fever, cough and shortness of breath. . After you have been seen by a medical provider, you will be either: o Tested for (COVID-19) and discharged home on quarantine except to seek medical care if symptoms worsen, and asked to  - Stay home and avoid contact with others until you get your results (4-5 days)  - Avoid travel on public transportation if possible (such as bus, train, or airplane) or o Sent to the Emergency Department by EMS for evaluation, COVID-19 testing, and possible admission depending on your condition and test results.  What to do if you are LOW RISK for COVID-19?  Reduce your risk of any infection by using the same precautions used for avoiding the common cold or flu:  Marland Kitchen Wash your hands often with soap and warm water for at least 20 seconds.  If soap and water are not readily available, use an alcohol-based hand sanitizer with at least 60% alcohol.  . If coughing or sneezing, cover your mouth and nose by coughing or sneezing into the elbow areas of your shirt or coat, into a tissue or into your sleeve (not your hands). . Avoid shaking hands with others  and consider head nods or verbal greetings only. . Avoid touching your eyes, nose, or mouth with unwashed hands.  . Avoid close contact with people who are sick. . Avoid places or events with large numbers of people in one location, like concerts or sporting events. . Carefully consider travel plans you have or are making. . If you are planning any travel outside or inside the [place], visit the CDC's Travelers' Health webpage for the latest health notices. . If you have some symptoms but not all symptoms, continue to monitor at home and seek medical attention if  your symptoms worsen. . If you are having a medical emergency, call 911.   [place] / e-Visit: [email or web address]         [facility]: [facility]: [phone]                   [facility]: [phone]           It is flu season:   >>> Best ways to protect herself from the flu: Receive the yearly flu vaccine, practice good hand hygiene washing with soap and also using hand sanitizer when available, eat a nutritious meals, get adequate rest, hydrate appropriately   Please contact the office if your symptoms worsen or you have concerns that you are not improving.   Thank you for choosing [facility] for your healthcare, and for allowing [place] to partner with you on your healthcare journey. I am thankful to be able to provide care to you today.   Wyn Quaker FNP-C

## 2018-06-07 NOTE — Assessment & Plan Note (Signed)
Plan: Restart Spiriva Respimat 2.5 Restart Symbicort 160 Follow-up with our office in 3 months

## 2018-06-08 DIAGNOSIS — N2581 Secondary hyperparathyroidism of renal origin: Secondary | ICD-10-CM | POA: Diagnosis not present

## 2018-06-08 DIAGNOSIS — E119 Type 2 diabetes mellitus without complications: Secondary | ICD-10-CM | POA: Diagnosis not present

## 2018-06-08 DIAGNOSIS — N186 End stage renal disease: Secondary | ICD-10-CM | POA: Diagnosis not present

## 2018-06-08 DIAGNOSIS — D509 Iron deficiency anemia, unspecified: Secondary | ICD-10-CM | POA: Diagnosis not present

## 2018-06-08 DIAGNOSIS — D631 Anemia in chronic kidney disease: Secondary | ICD-10-CM | POA: Diagnosis not present

## 2018-06-09 DIAGNOSIS — Z0000 Encounter for general adult medical examination without abnormal findings: Secondary | ICD-10-CM | POA: Diagnosis not present

## 2018-06-09 DIAGNOSIS — E785 Hyperlipidemia, unspecified: Secondary | ICD-10-CM | POA: Diagnosis not present

## 2018-06-09 DIAGNOSIS — N186 End stage renal disease: Secondary | ICD-10-CM | POA: Diagnosis not present

## 2018-06-09 DIAGNOSIS — Z1331 Encounter for screening for depression: Secondary | ICD-10-CM | POA: Diagnosis not present

## 2018-06-09 DIAGNOSIS — J449 Chronic obstructive pulmonary disease, unspecified: Secondary | ICD-10-CM | POA: Diagnosis not present

## 2018-06-09 DIAGNOSIS — Z992 Dependence on renal dialysis: Secondary | ICD-10-CM | POA: Diagnosis not present

## 2018-06-09 DIAGNOSIS — M810 Age-related osteoporosis without current pathological fracture: Secondary | ICD-10-CM | POA: Diagnosis not present

## 2018-06-09 DIAGNOSIS — G4733 Obstructive sleep apnea (adult) (pediatric): Secondary | ICD-10-CM | POA: Diagnosis not present

## 2018-06-09 DIAGNOSIS — D649 Anemia, unspecified: Secondary | ICD-10-CM | POA: Diagnosis not present

## 2018-06-09 DIAGNOSIS — Z794 Long term (current) use of insulin: Secondary | ICD-10-CM | POA: Diagnosis not present

## 2018-06-09 DIAGNOSIS — E211 Secondary hyperparathyroidism, not elsewhere classified: Secondary | ICD-10-CM | POA: Diagnosis not present

## 2018-06-09 DIAGNOSIS — M109 Gout, unspecified: Secondary | ICD-10-CM | POA: Diagnosis not present

## 2018-06-10 DIAGNOSIS — D509 Iron deficiency anemia, unspecified: Secondary | ICD-10-CM | POA: Diagnosis not present

## 2018-06-10 DIAGNOSIS — E119 Type 2 diabetes mellitus without complications: Secondary | ICD-10-CM | POA: Diagnosis not present

## 2018-06-10 DIAGNOSIS — D631 Anemia in chronic kidney disease: Secondary | ICD-10-CM | POA: Diagnosis not present

## 2018-06-10 DIAGNOSIS — N186 End stage renal disease: Secondary | ICD-10-CM | POA: Diagnosis not present

## 2018-06-10 DIAGNOSIS — N2581 Secondary hyperparathyroidism of renal origin: Secondary | ICD-10-CM | POA: Diagnosis not present

## 2018-06-12 DIAGNOSIS — D631 Anemia in chronic kidney disease: Secondary | ICD-10-CM | POA: Diagnosis not present

## 2018-06-12 DIAGNOSIS — D509 Iron deficiency anemia, unspecified: Secondary | ICD-10-CM | POA: Diagnosis not present

## 2018-06-12 DIAGNOSIS — E119 Type 2 diabetes mellitus without complications: Secondary | ICD-10-CM | POA: Diagnosis not present

## 2018-06-12 DIAGNOSIS — N186 End stage renal disease: Secondary | ICD-10-CM | POA: Diagnosis not present

## 2018-06-12 DIAGNOSIS — N2581 Secondary hyperparathyroidism of renal origin: Secondary | ICD-10-CM | POA: Diagnosis not present

## 2018-06-15 DIAGNOSIS — D509 Iron deficiency anemia, unspecified: Secondary | ICD-10-CM | POA: Diagnosis not present

## 2018-06-15 DIAGNOSIS — N2581 Secondary hyperparathyroidism of renal origin: Secondary | ICD-10-CM | POA: Diagnosis not present

## 2018-06-15 DIAGNOSIS — N186 End stage renal disease: Secondary | ICD-10-CM | POA: Diagnosis not present

## 2018-06-15 DIAGNOSIS — D631 Anemia in chronic kidney disease: Secondary | ICD-10-CM | POA: Diagnosis not present

## 2018-06-15 DIAGNOSIS — E119 Type 2 diabetes mellitus without complications: Secondary | ICD-10-CM | POA: Diagnosis not present

## 2018-06-17 ENCOUNTER — Other Ambulatory Visit: Payer: Self-pay

## 2018-06-17 ENCOUNTER — Encounter (HOSPITAL_COMMUNITY): Payer: Self-pay | Admitting: General Practice

## 2018-06-17 ENCOUNTER — Inpatient Hospital Stay (HOSPITAL_COMMUNITY)
Admission: AD | Admit: 2018-06-17 | Discharge: 2018-06-24 | DRG: 291 | Disposition: A | Discharge status: Skilled Nursing Facility | Payer: Medicare Other | Source: Ambulatory Visit | Attending: Cardiovascular Disease | Admitting: Cardiovascular Disease

## 2018-06-17 ENCOUNTER — Inpatient Hospital Stay (HOSPITAL_COMMUNITY): Payer: Medicare Other

## 2018-06-17 DIAGNOSIS — I080 Rheumatic disorders of both mitral and aortic valves: Secondary | ICD-10-CM | POA: Diagnosis present

## 2018-06-17 DIAGNOSIS — G4733 Obstructive sleep apnea (adult) (pediatric): Secondary | ICD-10-CM | POA: Diagnosis present

## 2018-06-17 DIAGNOSIS — F411 Generalized anxiety disorder: Secondary | ICD-10-CM | POA: Diagnosis present

## 2018-06-17 DIAGNOSIS — M109 Gout, unspecified: Secondary | ICD-10-CM | POA: Diagnosis not present

## 2018-06-17 DIAGNOSIS — Z8619 Personal history of other infectious and parasitic diseases: Secondary | ICD-10-CM

## 2018-06-17 DIAGNOSIS — Z90710 Acquired absence of both cervix and uterus: Secondary | ICD-10-CM | POA: Diagnosis not present

## 2018-06-17 DIAGNOSIS — N2581 Secondary hyperparathyroidism of renal origin: Secondary | ICD-10-CM | POA: Diagnosis not present

## 2018-06-17 DIAGNOSIS — E1122 Type 2 diabetes mellitus with diabetic chronic kidney disease: Secondary | ICD-10-CM | POA: Diagnosis present

## 2018-06-17 DIAGNOSIS — I5033 Acute on chronic diastolic (congestive) heart failure: Secondary | ICD-10-CM | POA: Diagnosis not present

## 2018-06-17 DIAGNOSIS — D631 Anemia in chronic kidney disease: Secondary | ICD-10-CM | POA: Diagnosis not present

## 2018-06-17 DIAGNOSIS — Z20828 Contact with and (suspected) exposure to other viral communicable diseases: Secondary | ICD-10-CM | POA: Diagnosis not present

## 2018-06-17 DIAGNOSIS — I959 Hypotension, unspecified: Secondary | ICD-10-CM | POA: Diagnosis present

## 2018-06-17 DIAGNOSIS — I5021 Acute systolic (congestive) heart failure: Secondary | ICD-10-CM | POA: Diagnosis present

## 2018-06-17 DIAGNOSIS — H5461 Unqualified visual loss, right eye, normal vision left eye: Secondary | ICD-10-CM | POA: Diagnosis not present

## 2018-06-17 DIAGNOSIS — N186 End stage renal disease: Secondary | ICD-10-CM | POA: Diagnosis present

## 2018-06-17 DIAGNOSIS — Z79899 Other long term (current) drug therapy: Secondary | ICD-10-CM

## 2018-06-17 DIAGNOSIS — Z992 Dependence on renal dialysis: Secondary | ICD-10-CM | POA: Diagnosis not present

## 2018-06-17 DIAGNOSIS — Z87891 Personal history of nicotine dependence: Secondary | ICD-10-CM

## 2018-06-17 DIAGNOSIS — E119 Type 2 diabetes mellitus without complications: Secondary | ICD-10-CM | POA: Diagnosis not present

## 2018-06-17 DIAGNOSIS — Z888 Allergy status to other drugs, medicaments and biological substances status: Secondary | ICD-10-CM

## 2018-06-17 DIAGNOSIS — Z8249 Family history of ischemic heart disease and other diseases of the circulatory system: Secondary | ICD-10-CM | POA: Diagnosis not present

## 2018-06-17 DIAGNOSIS — Z6841 Body Mass Index (BMI) 40.0 and over, adult: Secondary | ICD-10-CM

## 2018-06-17 DIAGNOSIS — I2510 Atherosclerotic heart disease of native coronary artery without angina pectoris: Secondary | ICD-10-CM | POA: Diagnosis present

## 2018-06-17 DIAGNOSIS — R0602 Shortness of breath: Secondary | ICD-10-CM | POA: Diagnosis present

## 2018-06-17 DIAGNOSIS — D509 Iron deficiency anemia, unspecified: Secondary | ICD-10-CM | POA: Diagnosis not present

## 2018-06-17 DIAGNOSIS — E669 Obesity, unspecified: Secondary | ICD-10-CM | POA: Diagnosis not present

## 2018-06-17 DIAGNOSIS — Z7982 Long term (current) use of aspirin: Secondary | ICD-10-CM

## 2018-06-17 DIAGNOSIS — J811 Chronic pulmonary edema: Secondary | ICD-10-CM

## 2018-06-17 DIAGNOSIS — I252 Old myocardial infarction: Secondary | ICD-10-CM

## 2018-06-17 DIAGNOSIS — E1140 Type 2 diabetes mellitus with diabetic neuropathy, unspecified: Secondary | ICD-10-CM | POA: Diagnosis present

## 2018-06-17 DIAGNOSIS — I5031 Acute diastolic (congestive) heart failure: Secondary | ICD-10-CM | POA: Diagnosis not present

## 2018-06-17 DIAGNOSIS — Z96652 Presence of left artificial knee joint: Secondary | ICD-10-CM | POA: Diagnosis present

## 2018-06-17 DIAGNOSIS — D638 Anemia in other chronic diseases classified elsewhere: Secondary | ICD-10-CM | POA: Diagnosis present

## 2018-06-17 DIAGNOSIS — J441 Chronic obstructive pulmonary disease with (acute) exacerbation: Secondary | ICD-10-CM | POA: Diagnosis not present

## 2018-06-17 DIAGNOSIS — I132 Hypertensive heart and chronic kidney disease with heart failure and with stage 5 chronic kidney disease, or end stage renal disease: Principal | ICD-10-CM | POA: Diagnosis present

## 2018-06-17 DIAGNOSIS — R079 Chest pain, unspecified: Secondary | ICD-10-CM

## 2018-06-17 DIAGNOSIS — H5440 Blindness, one eye, unspecified eye: Secondary | ICD-10-CM | POA: Diagnosis present

## 2018-06-17 DIAGNOSIS — Z09 Encounter for follow-up examination after completed treatment for conditions other than malignant neoplasm: Secondary | ICD-10-CM

## 2018-06-17 DIAGNOSIS — J810 Acute pulmonary edema: Secondary | ICD-10-CM | POA: Diagnosis not present

## 2018-06-17 LAB — TSH: TSH: 1.825 u[IU]/mL (ref 0.350–4.500)

## 2018-06-17 LAB — HEMOGLOBIN A1C
Hgb A1c MFr Bld: 6.2 % — ABNORMAL HIGH (ref 4.8–5.6)
Mean Plasma Glucose: 131.24 mg/dL

## 2018-06-17 LAB — SARS CORONAVIRUS 2 BY RT PCR (HOSPITAL ORDER, PERFORMED IN ~~LOC~~ HOSPITAL LAB): SARS Coronavirus 2: NEGATIVE

## 2018-06-17 LAB — COMPREHENSIVE METABOLIC PANEL
ALT: 12 U/L (ref 0–44)
AST: 17 U/L (ref 15–41)
Albumin: 3.2 g/dL — ABNORMAL LOW (ref 3.5–5.0)
Alkaline Phosphatase: 63 U/L (ref 38–126)
Anion gap: 13 (ref 5–15)
BUN: 11 mg/dL (ref 8–23)
CO2: 33 mmol/L — ABNORMAL HIGH (ref 22–32)
Calcium: 9.2 mg/dL (ref 8.9–10.3)
Chloride: 90 mmol/L — ABNORMAL LOW (ref 98–111)
Creatinine, Ser: 4.48 mg/dL — ABNORMAL HIGH (ref 0.44–1.00)
GFR calc Af Amer: 10 mL/min — ABNORMAL LOW (ref >60)
GFR calc non Af Amer: 9 mL/min — ABNORMAL LOW (ref >60)
Glucose, Bld: 90 mg/dL (ref 70–99)
Potassium: 3.7 mmol/L (ref 3.5–5.1)
Sodium: 136 mmol/L (ref 135–145)
Total Bilirubin: 0.6 mg/dL (ref 0.3–1.2)
Total Protein: 7.9 g/dL (ref 6.5–8.1)

## 2018-06-17 LAB — GLUCOSE, CAPILLARY
Glucose-Capillary: 107 mg/dL — ABNORMAL HIGH (ref 70–99)
Glucose-Capillary: 122 mg/dL — ABNORMAL HIGH (ref 70–99)

## 2018-06-17 LAB — CBC WITH DIFFERENTIAL/PLATELET
Abs Immature Granulocytes: 0.03 10*3/uL (ref 0.00–0.07)
Basophils Absolute: 0.1 10*3/uL (ref 0.0–0.1)
Basophils Relative: 1 %
Eosinophils Absolute: 0.5 10*3/uL (ref 0.0–0.5)
Eosinophils Relative: 6 %
HCT: 28.5 % — ABNORMAL LOW (ref 36.0–46.0)
Hemoglobin: 8.9 g/dL — ABNORMAL LOW (ref 12.0–15.0)
Immature Granulocytes: 0 %
Lymphocytes Relative: 25 %
Lymphs Abs: 1.9 10*3/uL (ref 0.7–4.0)
MCH: 27.7 pg (ref 26.0–34.0)
MCHC: 31.2 g/dL (ref 30.0–36.0)
MCV: 88.8 fL (ref 80.0–100.0)
Monocytes Absolute: 1.1 10*3/uL — ABNORMAL HIGH (ref 0.1–1.0)
Monocytes Relative: 14 %
Neutro Abs: 4 10*3/uL (ref 1.7–7.7)
Neutrophils Relative %: 54 %
Platelets: 229 10*3/uL (ref 150–400)
RBC: 3.21 MIL/uL — ABNORMAL LOW (ref 3.87–5.11)
RDW: 16.4 % — ABNORMAL HIGH (ref 11.5–15.5)
WBC: 7.5 10*3/uL (ref 4.0–10.5)
nRBC: 0 % (ref 0.0–0.2)

## 2018-06-17 LAB — BRAIN NATRIURETIC PEPTIDE: B Natriuretic Peptide: 509.5 pg/mL — ABNORMAL HIGH (ref 0.0–100.0)

## 2018-06-17 MED ORDER — ALBUTEROL SULFATE (2.5 MG/3ML) 0.083% IN NEBU: 2.5000 mg | INHALATION_SOLUTION | RESPIRATORY_TRACT | Status: DC | PRN

## 2018-06-17 MED ORDER — MIDODRINE HCL 5 MG PO TABS
10.0000 mg | ORAL_TABLET | ORAL | Status: DC
  Administered 2018-06-19 – 2018-06-24 (×3): 10 mg via ORAL
  Filled 2018-06-17 (×2): qty 2

## 2018-06-17 MED ORDER — MOMETASONE FURO-FORMOTEROL FUM 200-5 MCG/ACT IN AERO: 2.0000 | INHALATION_SPRAY | Freq: Two times a day (BID) | RESPIRATORY_TRACT | Status: DC

## 2018-06-17 MED ORDER — SODIUM CHLORIDE 0.9% FLUSH: 3.0000 mL | INTRAVENOUS | Status: DC | PRN

## 2018-06-17 MED ORDER — ASPIRIN EC 81 MG PO TBEC
81.0000 mg | DELAYED_RELEASE_TABLET | Freq: Every day | ORAL | Status: DC
  Administered 2018-06-18 – 2018-06-24 (×7): 81 mg via ORAL
  Filled 2018-06-17 (×7): qty 1

## 2018-06-17 MED ORDER — HEPARIN SODIUM (PORCINE) 5000 UNIT/ML IJ SOLN
5000.0000 [IU] | Freq: Three times a day (TID) | INTRAMUSCULAR | Status: DC
  Administered 2018-06-17 – 2018-06-24 (×16): 5000 [IU] via SUBCUTANEOUS
  Filled 2018-06-17 (×16): qty 1

## 2018-06-17 MED ORDER — TIOTROPIUM BROMIDE MONOHYDRATE 2.5 MCG/ACT IN AERS: 2.0000 | INHALATION_SPRAY | Freq: Every day | RESPIRATORY_TRACT | Status: DC

## 2018-06-17 MED ORDER — SODIUM CHLORIDE 0.9 % IV SOLN: 250.0000 mL | INTRAVENOUS | Status: DC | PRN

## 2018-06-17 MED ORDER — INSULIN ASPART 100 UNIT/ML ~~LOC~~ SOLN
0.0000 [IU] | Freq: Three times a day (TID) | SUBCUTANEOUS | Status: DC
  Administered 2018-06-18 – 2018-06-20 (×5): 1 [IU] via SUBCUTANEOUS
  Administered 2018-06-20: 13:00:00 2 [IU] via SUBCUTANEOUS
  Administered 2018-06-23: 3 [IU] via SUBCUTANEOUS

## 2018-06-17 MED ORDER — SEVELAMER CARBONATE 800 MG PO TABS
3200.0000 mg | ORAL_TABLET | Freq: Three times a day (TID) | ORAL | Status: DC
  Administered 2018-06-18 – 2018-06-24 (×17): 3200 mg via ORAL
  Filled 2018-06-17 (×17): qty 4

## 2018-06-17 MED ORDER — ETHYL CHLORIDE EX AERO: 1.0000 "application " | INHALATION_SPRAY | CUTANEOUS | Status: DC

## 2018-06-17 MED ORDER — CINACALCET HCL 30 MG PO TABS
90.0000 mg | ORAL_TABLET | Freq: Every day | ORAL | Status: DC
  Administered 2018-06-18 – 2018-06-24 (×7): 90 mg via ORAL
  Filled 2018-06-17 (×9): qty 3

## 2018-06-17 MED ORDER — SODIUM CHLORIDE 0.9% FLUSH
3.0000 mL | Freq: Two times a day (BID) | INTRAVENOUS | Status: DC
  Administered 2018-06-18 – 2018-06-24 (×10): 3 mL via INTRAVENOUS

## 2018-06-17 MED ORDER — UMECLIDINIUM BROMIDE 62.5 MCG/INH IN AEPB
1.0000 | INHALATION_SPRAY | Freq: Every day | RESPIRATORY_TRACT | Status: DC
  Administered 2018-06-18 – 2018-06-24 (×7): 1 via RESPIRATORY_TRACT
  Filled 2018-06-17: qty 7

## 2018-06-17 MED ORDER — SEVELAMER CARBONATE 800 MG PO TABS
1600.0000 mg | ORAL_TABLET | Freq: Two times a day (BID) | ORAL | Status: DC | PRN
  Administered 2018-06-21: 1600 mg via ORAL
  Filled 2018-06-17: qty 2

## 2018-06-17 MED ORDER — ADULT MULTIVITAMIN W/MINERALS CH
1.0000 | ORAL_TABLET | Freq: Every day | ORAL | Status: DC
  Administered 2018-06-18 – 2018-06-24 (×7): 1 via ORAL
  Filled 2018-06-17 (×7): qty 1

## 2018-06-17 NOTE — Progress Notes (Signed)
Called and spoke with Colletta Maryland RN [id number] for PIV. No infusions ordered at this time. Enzo Bi RN to notify [facility] if needing PIV when infusions are ordered. VU. Fran Lowes, RN [facility]

## 2018-06-17 NOTE — H&P (Signed)
Referring Physician:  [name] is an 73 y.o. female.                       Chief Complaint: Shortness of breath  HPI: 73 years old female has shortness of breath with cough but without fever for about 1 month. She has PMH of diastolic heart failure, anemia of chronic disease, ESRD with T-TH-Sat dialysis, right eye blind, hypertension, obesity, gout, OSA with CPAP and type 2 DM.   Past Medical History:  Diagnosis Date  . Abdominal abscess 12-17-10   abdominal abscesses x2 ? one at this time  . Anemia   . Arthritis    "all over" (08/23/2012)  . Blind right eye    fell and crushed socket and eye fell out, was replaced at [facility]  . CHF (congestive heart failure) ([place])   . Chronic lower back pain   . Coronary artery disease    normal coronaries by 08/25/12 cath  . Dyspnea   . Dysrhythmia    irregular, "skips beats"  . ESRD (end stage renal disease) on dialysis ([place])    "started 12/2011; [place]; TTS" (08/23/2012  . Exertional shortness of breath   . Eye drainage    "lots; since fall 07/2011" (08/23/2012)  . Gout   . H/O hiatal hernia   . Headache(784.0)    "weekly sometimes; since I fell and hit my head in 07/2011 08/23/2012)  . Heart murmur   . History of blood transfusion    "lots before starting dialysis" (08/23/2012)  . Hypertension    sees Dr. Willey Blade  . Inhalation injury    "worked at [employer]; can't inhale polyurethane or paint, etc" (08/23/2012)  . Myocardial infarction ([place]) 1970's or 1980's  . Neuromuscular disorder ([place])    diabetic neuropathy  . OSA on CPAP   . Staph aureus infection November 2012   . Swelling of both ankles 12-17-10  . Type II diabetes mellitus ([place])    "over 10 years now" (08/23/2012)      Past Surgical History:  Procedure Laterality Date  . [place] VITRECTOMY WITH 20 GAUGE MVR PORT Left 05/07/2017   Procedure: 25 GAUGE PARS PLANA VITRECTOMY WITH ENDOLASER LEFT EYE;  Surgeon: Bernarda Caffey, MD;  Location: [place];   Service: Ophthalmology;  Laterality: Left;  . APPENDECTOMY    . AV FISTULA PLACEMENT  09/30/2011   Procedure: ARTERIOVENOUS (AV) FISTULA CREATION;  Surgeon: Conrad Evergreen, MD;  Location: [place];  Service: Vascular;  Laterality: Left;  BRACHIAL-CEPHALIC  . BACK SURGERY    . BASCILIC VEIN TRANSPOSITION Left 11/10/2011   Left arm   . [place] TRANSPOSITION  01/28/2012   Procedure: BASCILIC VEIN TRANSPOSITION;  Surgeon: Conrad Bayside Gardens, MD;  Location: [name];  Service: Vascular;  Laterality: Left;  Second Stage   . BIOPSY  08/24/2017   Procedure: BIOPSY;  Surgeon: Wilford Corner, MD;  [facility];  Service: Endoscopy;;  . CARDIAC CATHETERIZATION  1980's  . CATARACT EXTRACTION W/ INTRAOCULAR LENS IMPLANT Bilateral 1990's  . COLONOSCOPY WITH PROPOFOL N/A 08/24/2017   Procedure: COLONOSCOPY WITH PROPOFOL;  Surgeon: Wilford Corner, MD;  [facility];  Service: Endoscopy;  Laterality: N/A;  . CORONARY ANGIOGRAPHY N/A 04/07/2017   Procedure: CORONARY ANGIOGRAPHY (CATH LAB);  Surgeon: Dixie Dials, MD;  Location: [place] CV LAB;  Service: Cardiovascular;  Laterality: N/A;  . DILATION AND CURETTAGE OF UTERUS  1960's  . ESOPHAGOGASTRODUODENOSCOPY (EGD) WITH  PROPOFOL N/A 08/24/2017   Procedure: ESOPHAGOGASTRODUODENOSCOPY (EGD) WITH PROPOFOL;  Surgeon: Wilford Corner, MD;  [facility];  Service: Endoscopy;  Laterality: N/A;  . EYE SURGERY    . FISTULA SUPERFICIALIZATION Left 02/18/2017   Procedure: SUPERFICIALIZATION LEFT ARM ARTERIOVENOUS FISTULA;  Surgeon: Serafina Mitchell, MD;  Location: [place];  Service: Vascular;  Laterality: Left;  . HERNIA REPAIR    . INSERTION OF DIALYSIS CATHETER  01/05/2012   Procedure: INSERTION OF DIALYSIS CATHETER;  Surgeon: Conrad Lincoln, MD;  Location: [place];  Service: Vascular;  Laterality: N/A;  Right Internal Jugular Placement  . INTRAOCULAR PROSTHESES INSERTION Right 2013   "fell; knocked my eye outl" (08/23/2012)  . JOINT REPLACEMENT     . LAPAROTOMY  12/18/2010   Procedure: EXPLORATORY LAPAROTOMY;  Surgeon: Rolm Bookbinder, MD;  [facility];  Service: General;  Laterality: N/A;  Abdominal Seroma Evacuation  . LEFT HEART CATHETERIZATION WITH CORONARY ANGIOGRAM N/A 08/25/2012   Procedure: LEFT HEART CATHETERIZATION WITH CORONARY ANGIOGRAM;  Surgeon: Birdie Riddle, MD;  Location: [place] C[facility];  Service: Cardiovascular;  Laterality: N/A;  . LUMBAR DISC SURGERY  1970'-80's   X 3  . TOTAL KNEE ARTHROPLASTY Left 1980's  . VAGINAL HYSTERECTOMY  1970's  . VENTRAL HERNIA REPAIR  2012-2013   component separation, repair with biologic; "had 3 surgeries to fix it" (08/23/2012)  . WOUND DEBRIDEMENT  03/20/2011   Procedure: DEBRIDEMENT ABDOMINAL WOUND;  Surgeon: Rolm Bookbinder, MD;  Location: [place];  Service: General;  Laterality: N/A;  debridement abdominal wall, placement of wound vac    Family History  Problem Relation Age of Onset  . Hypertension Mother   . Cancer Brother        spine  . Hypertension Father   . Cancer Sister        brain  . Hypertension Maternal Grandmother   . Anesthesia problems Neg Hx    Social History:  reports that she quit smoking about 37 years ago. Her smoking use included cigarettes. She has a 10.00 pack-year smoking history. She has never used smokeless tobacco. She reports that she does not drink alcohol or use drugs.  Allergies:  Allergies  Allergen Reactions  . Lyrica [Pregabalin] Other (See Comments)    Hallucinations    Medications Prior to Admission  Medication Sig Dispense Refill  . acetaminophen (TYLENOL) 325 MG tablet Take 2 tablets (650 mg total) by mouth every 6 (six) hours as needed for fever, headache or moderate pain. 30 tablet 0  . albuterol (PROVENTIL) (2.5 MG/3ML) 0.083% nebulizer solution Take 3 mLs (2.5 mg total) by nebulization every 2 (two) hours as needed for shortness of breath. 75 mL 0  . aspirin EC 81 MG tablet Take 81 mg by mouth daily.      .  budesonide-formoterol (SYMBICORT) 160-4.5 MCG/ACT inhaler Inhale 2 puffs into the lungs 2 (two) times daily. 1 Inhaler 0  . budesonide-formoterol (SYMBICORT) 160-4.5 MCG/ACT inhaler Inhale 2 puffs into the lungs 2 (two) times daily. 1 Inhaler 3  . cinacalcet (SENSIPAR) 90 MG tablet Take 90 mg by mouth daily.     Marland Kitchen ethyl chloride spray Apply 1 application topically See admin instructions. Apply topically before dialysis on Tuesday, Thursday, Saturday  5  . midodrine (PROAMATINE) 10 MG tablet Take 10 mg by mouth See admin instructions. Take 10mg  on Tues, Thurs, and Saturday before dialysis    . Multiple Vitamin (MULTIVITAMIN WITH MINERALS) TABS tablet Take 1 tablet by mouth daily.    Marland Kitchen  oxyCODONE-acetaminophen (PERCOCET/ROXICET) 5-325 MG tablet Take 1 tablet by mouth 4 (four) times daily as needed for pain.  0  . sevelamer carbonate (RENVELA) 800 MG tablet Take 2-3 tablets (1,600-2,400 mg total) by mouth See admin instructions. Take 3 tablets (2,400 mg) by mouth 3 times daily with meals. Take 2 tablets (1,600 mg) by mouth with snacks. Receives supply from dialysis center. (Patient taking differently: Take 1,600-2,400 mg by mouth See admin instructions. Pt takes 4 tablets after every meal)    . Tiotropium Bromide Monohydrate (SPIRIVA RESPIMAT) 2.5 MCG/ACT AERS Inhale 2 puffs into the lungs daily. 1 Inhaler 0  . Tiotropium Bromide Monohydrate (SPIRIVA RESPIMAT) 2.5 MCG/ACT AERS Inhale 2 puffs into the lungs daily. 1 Inhaler 3    Results for orders placed or performed during the hospital encounter of 06/17/18 (from the past 48 hour(s))  Glucose, capillary     Status: Abnormal   Collection Time: 06/17/18  4:28 PM  Result Value Ref Range   Glucose-Capillary 107 (H) 70 - 99 mg/dL   No results found.  Review Of Systems Constitutional: No fever, chills, weight loss or gain. Eyes: Chronic vision change. She has right eye blindness No discharge or pain. Ears: No hearing loss, No tinnitus. Respiratory: No  asthma, COPD, pneumonias. Positive shortness of breath. No hemoptysis. Cardiovascular: Positive chest pain, no palpitation, no leg edema. Gastrointestinal: No nausea, vomiting, diarrhea, constipation. No GI bleed. No hepatitis. Genitourinary: No dysuria, hematuria, kidney stone. No incontinance. ESRD Neurological: No headache, stroke, seizures.  Psychiatry: No psych facility admission for anxiety, depression, suicide. No detox. Skin: No rash. Musculoskeletal: Positive joint pain, fibromyalgia, neck pain, back pain. Lymphadenopathy: No lymphadenopathy. Hematology: Positive anemia, no easy bruising.   Blood pressure (!) 147/61, pulse 79, temperature 99.1 F (37.3 C), temperature source Oral, resp. rate (!) 22, SpO2 94 %. There is no height or weight on file to calculate BMI. General appearance: alert, cooperative, appears stated age and moderate respiratory distress Head: Normocephalic, atraumatic. Eyes: Brown eyes, Blind in right eye, pale pink conjunctiva, corneas clear. PERRL, EOM's intact. Neck: No adenopathy, no carotid bruit, no JVD, supple, symmetrical, trachea midline and thyroid not enlarged. Resp: Clearing to auscultation bilaterally. Cardio: Regular rate and rhythm, S1, S2 normal, II/VI systolic murmur, no click, rub or gallop GI: Soft, non-tender; bowel sounds normal; no organomegaly. Extremities: No edema, cyanosis or clubbing. Skin: Warm and dry.  Neurologic: Alert and oriented X 3, normal strength. Normal coordination and slow gait.  Assessment/Plan Shortness of breath H/O diastolic heart failure R/O pneumonia ESRD Obesity Gout Hypertension Anemia of chronic disease OSA with CPAP use  Admit. CXR, blood work, echocardiogram, BNP. Home medications.  Birdie Riddle, MD  06/17/2018, 5:33 PM

## 2018-06-17 NOTE — Plan of Care (Signed)
  Problem: Clinical Measurements: Goal: Will remain free from infection Outcome: Progressing Goal: Respiratory complications will improve Outcome: Progressing   Problem: Activity: Goal: Risk for activity intolerance will decrease Outcome: Progressing   Problem: Pain Managment: Goal: General experience of comfort will improve Outcome: Progressing   Problem: Safety: Goal: Ability to remain free from injury will improve Outcome: Progressing   Problem: Skin Integrity: Goal: Risk for impaired skin integrity will decrease Outcome: Progressing

## 2018-06-18 ENCOUNTER — Encounter (HOSPITAL_COMMUNITY): Payer: Self-pay | Admitting: Nephrology

## 2018-06-18 ENCOUNTER — Inpatient Hospital Stay (HOSPITAL_COMMUNITY): Payer: Medicare Other

## 2018-06-18 LAB — PROTIME-INR
INR: 1.2 (ref 0.8–1.2)
Prothrombin Time: 15.2 seconds (ref 11.4–15.2)

## 2018-06-18 LAB — CBC
HCT: 27.3 % — ABNORMAL LOW (ref 36.0–46.0)
Hemoglobin: 8.5 g/dL — ABNORMAL LOW (ref 12.0–15.0)
MCH: 27.9 pg (ref 26.0–34.0)
MCHC: 31.1 g/dL (ref 30.0–36.0)
MCV: 89.5 fL (ref 80.0–100.0)
Platelets: 214 10*3/uL (ref 150–400)
RBC: 3.05 MIL/uL — ABNORMAL LOW (ref 3.87–5.11)
RDW: 16.4 % — ABNORMAL HIGH (ref 11.5–15.5)
WBC: 6.5 10*3/uL (ref 4.0–10.5)
nRBC: 0 % (ref 0.0–0.2)

## 2018-06-18 LAB — GLUCOSE, CAPILLARY
Glucose-Capillary: 95 mg/dL (ref 70–99)
Glucose-Capillary: 130 mg/dL — ABNORMAL HIGH (ref 70–99)
Glucose-Capillary: 98 mg/dL (ref 70–99)
Glucose-Capillary: 140 mg/dL — ABNORMAL HIGH (ref 70–99)

## 2018-06-18 LAB — BASIC METABOLIC PANEL
Anion gap: 12 (ref 5–15)
BUN: 19 mg/dL (ref 8–23)
CO2: 32 mmol/L (ref 22–32)
Calcium: 9.1 mg/dL (ref 8.9–10.3)
Chloride: 92 mmol/L — ABNORMAL LOW (ref 98–111)
Creatinine, Ser: 5.39 mg/dL — ABNORMAL HIGH (ref 0.44–1.00)
GFR calc Af Amer: 8 mL/min — ABNORMAL LOW (ref >60)
GFR calc non Af Amer: 7 mL/min — ABNORMAL LOW (ref >60)
Glucose, Bld: 116 mg/dL — ABNORMAL HIGH (ref 70–99)
Potassium: 4 mmol/L (ref 3.5–5.1)
Sodium: 136 mmol/L (ref 135–145)

## 2018-06-18 LAB — TROPONIN I
Troponin I: 0.03 ng/mL (ref <0.03)
Troponin I: 0.03 ng/mL (ref <0.03)
Troponin I: 0.03 ng/mL (ref <0.03)

## 2018-06-18 LAB — ECHOCARDIOGRAM COMPLETE

## 2018-06-18 MED ORDER — HEPARIN SODIUM (PORCINE) 1000 UNIT/ML DIALYSIS: 10000.0000 [IU] | Freq: Once | INTRAMUSCULAR | Status: DC

## 2018-06-18 MED ORDER — CHLORHEXIDINE GLUCONATE CLOTH 2 % EX PADS
6.0000 | MEDICATED_PAD | Freq: Every day | CUTANEOUS | Status: DC
  Administered 2018-06-18: 18:00:00 6 via TOPICAL

## 2018-06-18 MED ORDER — CHLORHEXIDINE GLUCONATE CLOTH 2 % EX PADS: 6.0000 | MEDICATED_PAD | Freq: Every day | CUTANEOUS | Status: DC

## 2018-06-18 MED ORDER — ALBUTEROL SULFATE (2.5 MG/3ML) 0.083% IN NEBU
2.5000 mg | INHALATION_SOLUTION | Freq: Four times a day (QID) | RESPIRATORY_TRACT | Status: AC
  Administered 2018-06-18 (×2): 2.5 mg via RESPIRATORY_TRACT
  Filled 2018-06-18 (×2): qty 3

## 2018-06-18 MED ORDER — ALLOPURINOL 100 MG PO TABS
50.0000 mg | ORAL_TABLET | Freq: Every day | ORAL | Status: DC
  Administered 2018-06-18 – 2018-06-24 (×7): 50 mg via ORAL
  Filled 2018-06-18 (×7): qty 1

## 2018-06-18 MED ORDER — ATORVASTATIN CALCIUM 40 MG PO TABS
40.0000 mg | ORAL_TABLET | Freq: Every day | ORAL | Status: DC
  Administered 2018-06-18 – 2018-06-24 (×7): 40 mg via ORAL
  Filled 2018-06-18 (×7): qty 1

## 2018-06-18 MED ORDER — HYDRALAZINE HCL 25 MG PO TABS
25.0000 mg | ORAL_TABLET | Freq: Two times a day (BID) | ORAL | Status: DC
  Administered 2018-06-18 – 2018-06-24 (×9): 25 mg via ORAL
  Filled 2018-06-18 (×10): qty 1

## 2018-06-18 NOTE — Progress Notes (Signed)
Renal Navigator notified OP HD clinic/[facility] of patient's admission and negative COVID 19 rapid test result in order to provide continuity of care and safety.  Fredda Clarida Elizabeth, LCSW Renal Navigator [phone] 

## 2018-06-18 NOTE — Procedures (Signed)
   I was present at this dialysis session, have reviewed the session itself and made  appropriate changes Kelly Splinter MD [place] pager [phone]   06/18/2018, 3:14 PM

## 2018-06-18 NOTE — Progress Notes (Signed)
Patient refuses CPAP for the night. Patient states that she does not use one at home. Scheduled neb given.

## 2018-06-18 NOTE — Progress Notes (Signed)
  Echocardiogram 2D Echocardiogram has been performed.  [name] 06/18/2018, 9:20 AM

## 2018-06-18 NOTE — Consult Note (Signed)
Renal Service Consult Note [facility]  [name] 06/18/2018 [name] Requesting Physician:  Dr Doylene Canard  Reason for Consult:  ESRD pt w/ SOB and chest pain HPI: The patient is a 73 y.o. year-old w/ history of DM2, MI, HTN, gout, ESRD on HD presented w/ CP and SOB, asked to see for ESRD.    Patient states she has been here before a couple of times for SOB. She has poor appetite. States she came off of HD yest 2kg under her dry wt. No fevers, cough or abd pain.    ROS  denies CP  no joint pain   no HA  no blurry vision  no rash  no diarrhea  no nausea/ vomiting     Past Medical History  Past Medical History:  Diagnosis Date  . Abdominal abscess 12-17-10   abdominal abscesses x2 ? one at this time  . Anemia   . Arthritis    "all over" (08/23/2012)  . Blind right eye    fell and crushed socket and eye fell out, was [facility] at [facility]  . CHF (congestive heart failure) ([place])   . Chronic lower back pain   . Coronary artery disease    normal coronaries by 08/25/12 cath  . Dyspnea   . Dysrhythmia    irregular, "skips beats"  . ESRD (end stage renal disease) on dialysis ([place])    "started 12/2011; [place]; TTS" (08/23/2012  . Exertional shortness of breath   . Eye drainage    "lots; since fall 07/2011" (08/23/2012)  . Gout   . H/O hiatal hernia   . Headache(784.0)    "weekly sometimes; since I fell and hit my head in 07/2011 08/23/2012)  . Heart murmur   . History of blood transfusion    "lots before starting dialysis" (08/23/2012)  . Hypertension    sees Dr. Willey Blade  . Inhalation injury    "worked at [employer]; can't inhale polyurethane or paint, etc" (08/23/2012)  . Myocardial infarction ([place]) 1970's or 1980's  . Neuromuscular disorder ([place])    diabetic neuropathy  . OSA on CPAP   . Staph aureus infection November 2012   . Swelling of both ankles 12-17-10  . Type II diabetes mellitus ([place])    "over 10 years now" (08/23/2012)   Past  Surgical History  Past Surgical History:  Procedure Laterality Date  . [place] VITRECTOMY WITH 20 GAUGE MVR PORT Left 05/07/2017   Procedure: 25 GAUGE PARS PLANA VITRECTOMY WITH ENDOLASER LEFT EYE;  Surgeon: Bernarda Caffey, MD;  Location: [place];  Service: Ophthalmology;  Laterality: Left;  . APPENDECTOMY    . AV FISTULA PLACEMENT  09/30/2011   Procedure: ARTERIOVENOUS (AV) FISTULA CREATION;  Surgeon: Conrad Mooreland, MD;  Location: [place];  Service: Vascular;  Laterality: Left;  BRACHIAL-CEPHALIC  . BACK SURGERY    . BASCILIC VEIN TRANSPOSITION Left 11/10/2011   Left arm   . [place] TRANSPOSITION  01/28/2012   Procedure: BASCILIC VEIN TRANSPOSITION;  Surgeon: Conrad Aline, MD;  Location: [place];  Service: Vascular;  Laterality: Left;  Second Stage   . BIOPSY  08/24/2017   Procedure: BIOPSY;  Surgeon: Wilford Corner, MD;  [facility];  Service: Endoscopy;;  . CARDIAC CATHETERIZATION  1980's  . CATARACT EXTRACTION W/ INTRAOCULAR LENS IMPLANT Bilateral 1990's  . COLONOSCOPY WITH PROPOFOL N/A 08/24/2017   Procedure: COLONOSCOPY WITH PROPOFOL;  Surgeon: Wilford Corner, MD;  [facility];  Service: Endoscopy;  Laterality: N/A;  . CORONARY ANGIOGRAPHY N/A 04/07/2017   Procedure: CORONARY ANGIOGRAPHY (CATH LAB);  Surgeon: Dixie Dials, MD;  Location: [place] CV LAB;  Service: Cardiovascular;  Laterality: N/A;  . DILATION AND CURETTAGE OF UTERUS  1960's  . ESOPHAGOGASTRODUODENOSCOPY (EGD) WITH PROPOFOL N/A 08/24/2017   Procedure: ESOPHAGOGASTRODUODENOSCOPY (EGD) WITH PROPOFOL;  Surgeon: Wilford Corner, MD;  [facility];  Service: Endoscopy;  Laterality: N/A;  . EYE SURGERY    . FISTULA SUPERFICIALIZATION Left 02/18/2017   Procedure: SUPERFICIALIZATION LEFT ARM ARTERIOVENOUS FISTULA;  Surgeon: Serafina Mitchell, MD;  Location: [place];  Service: Vascular;  Laterality: Left;  . HERNIA REPAIR    . INSERTION OF DIALYSIS CATHETER  01/05/2012   Procedure:  INSERTION OF DIALYSIS CATHETER;  Surgeon: Conrad Adrian, MD;  Location: [place];  Service: Vascular;  Laterality: N/A;  Right Internal Jugular Placement  . INTRAOCULAR PROSTHESES INSERTION Right 2013   "fell; knocked my eye outl" (08/23/2012)  . JOINT REPLACEMENT    . LAPAROTOMY  12/18/2010   Procedure: EXPLORATORY LAPAROTOMY;  Surgeon: Rolm Bookbinder, MD;  [facility];  Service: General;  Laterality: N/A;  Abdominal Seroma Evacuation  . LEFT HEART CATHETERIZATION WITH CORONARY ANGIOGRAM N/A 08/25/2012   Procedure: LEFT HEART CATHETERIZATION WITH CORONARY ANGIOGRAM;  Surgeon: Birdie Riddle, MD;  Location: [place] CATH LAB;  Service: Cardiovascular;  Laterality: N/A;  . LUMBAR DISC SURGERY  1970'-80's   X 3  . TOTAL KNEE ARTHROPLASTY Left 1980's  . VAGINAL HYSTERECTOMY  1970's  . VENTRAL HERNIA REPAIR  2012-2013   component separation, repair with biologic; "had 3 surgeries to fix it" (08/23/2012)  . WOUND DEBRIDEMENT  03/20/2011   Procedure: DEBRIDEMENT ABDOMINAL WOUND;  Surgeon: Rolm Bookbinder, MD;  Location: [place];  Service: General;  Laterality: N/A;  debridement abdominal wall, placement of wound vac   Family History  Family History  Problem Relation Age of Onset  . Hypertension Mother   . Cancer Brother        spine  . Hypertension Father   . Cancer Sister        brain  . Hypertension Maternal Grandmother   . Anesthesia problems Neg Hx    Social History  reports that she quit smoking about 37 years ago. Her smoking use included cigarettes. She has a 10.00 pack-year smoking history. She has never used smokeless tobacco. She reports that she does not drink alcohol or use drugs. Allergies  Allergies  Allergen Reactions  . Lyrica [Pregabalin] Other (See Comments)    Hallucinations   Home medications Prior to Admission medications   Medication Sig Start Date End Date Taking? Authorizing Provider  albuterol (PROVENTIL) (2.5 MG/3ML) 0.083% nebulizer solution Take 3 mLs (2.5 mg  total) by nebulization every 2 (two) hours as needed for shortness of breath. 10/21/16  Yes Elwin Mocha, MD  aspirin EC 81 MG tablet Take 81 mg by mouth daily.     Yes [provider]  atorvastatin (LIPITOR) 40 MG tablet Take 40 mg by mouth daily.   Yes [provider]  budesonide-formoterol (SYMBICORT) 160-4.5 MCG/ACT inhaler Inhale 2 puffs into the lungs 2 (two) times daily. 06/07/18  Yes Lauraine Rinne, NP  cinacalcet (SENSIPAR) 60 MG tablet Take 60 mg by mouth daily.    Yes [provider]  colchicine 0.6 MG tablet Take 0.6 mg by mouth daily as needed (at onset of gout flare).   Yes [provider]  midodrine (PROAMATINE) 10  MG tablet Take 10-20 mg by mouth See admin instructions. Take 1 tablet by mouth as directed with meals Take 1 tablet by mouth just before dialysis and another 1 tablet in the middle of dialysis 04/26/17  Yes [provider]  sevelamer carbonate (RENVELA) 800 MG tablet Take 2-3 tablets (1,600-2,400 mg total) by mouth See admin instructions. Take 3 tablets (2,400 mg) by mouth 3 times daily with meals. Take 2 tablets (1,600 mg) by mouth with snacks. Receives supply from dialysis center. Patient taking differently: Take 1,600-2,400 mg by mouth See admin instructions. Pt takes 4 tablets by mouth 3 times daily with each meals and 2 tablets twice a day as needed with snacks 04/09/17  Yes Dixie Dials, MD  Tiotropium Bromide Monohydrate (SPIRIVA RESPIMAT) 2.5 MCG/ACT AERS Inhale 2 puffs into the lungs daily. 06/07/18  Yes Lauraine Rinne, NP  ethyl chloride spray Apply 1 application topically See admin instructions. Apply topically before dialysis on Tuesday, Thursday, Saturday 01/10/16   [provider]   Liver Function Tests Recent Labs  Lab 06/17/18 1806  AST 17  ALT 12  ALKPHOS 63  BILITOT 0.6  PROT 7.9  ALBUMIN 3.2*   No results for input(s): LIPASE, AMYLASE in the last 168 hours. CBC Recent Labs  Lab 06/17/18 1806  06/18/18 0336  WBC 7.5 6.5  NEUTROABS 4.0  --   HGB 8.9* 8.5*  HCT 28.5* 27.3*  MCV 88.8 89.5  PLT 229 630   Basic Metabolic Panel Recent Labs  Lab 06/17/18 1806 06/18/18 0336  NA 136 136  K 3.7 4.0  CL 90* 92*  CO2 33* 32  GLUCOSE 90 116*  BUN 11 19  CREATININE 4.48* 5.39*  CALCIUM 9.2 9.1   Iron/TIBC/Ferritin/ %Sat    Component Value Date/Time   IRON 185 (H) 02/07/2016 1137   TIBC 272 02/07/2016 1137   FERRITIN 2,583 (H) 02/07/2016 1137   IRONPCTSAT 68 (H) 02/07/2016 1137    Vitals:   06/17/18 2000 06/17/18 2341 06/18/18 0629 06/18/18 0737  BP: (!) 140/54 (!) 168/93 (!) 153/64 (!) 152/76  Pulse: 81 81 75   Resp:      Temp: 98.7 F (37.1 C) 98.7 F (37.1 C) 98.4 F (36.9 C) 98.3 F (36.8 C)  TempSrc: Oral Oral Oral Oral  SpO2: 94% 92% 98% 93%   Exam Gen alert, visibly SOB on exam No rash, cyanosis or gangrene Sclera anicteric, throat clear  No jvd or bruits Chest some rales L base, R chest clear RRR no MRG  Abd soft ntnd no mass or ascites +bs GU defer MS no joint effusions or deformity Ext 1+ pretib edema, no wounds or ulcers  Neuro is alert, Ox 3 , nf AVF+bruit    Home meds:  - aspirin 81/ atorvastatin 40  - tiotropium 2 puffs qd/ budesonide-formoterol bid  - sevelamer carb ac/ colchicine 0.6 prn/ cinacalcet 60 qd  - midodrine 10- 20 mg pre HD and again mid HD prn   TTS HD  4.5h  450/1.5  125kg    2/2 bath  Hep 13000 L  AVF - mircera 100 q 2   - vit D 3 ug tiw   CXR - sig bilat pulm edema  Assessment: 1. Dyspnea/ pulm edema - likely continues to lost body wt. Plan HD 3h today and tomorrow, lower edw as much as possible.  2. ESRD - on HD TTS 3. Hypotension - chronic on midodrine 4. Anemia ckd - follow Hb 5. HTN - should  improve w/ HD  Plan: 1. As above      Kelly Splinter  MD 06/18/2018, 11:34 AM

## 2018-06-18 NOTE — Progress Notes (Signed)
Ref: [name], MD   Subjective:  Awake. Short of breath at rest. BNP is elevated. Positive orthopnea. Echocardiogram shows moderate LVH, dilated LA and preserved LV systolic function with EF 55-60 %. Severe Mitral annular calcification with mild MS and severe AV calcification with mild AS.  Objective:  Vital Signs in the last 24 hours: Temp:  [98.3 F (36.8 C)-99.1 F (37.3 C)] 98.3 F (36.8 C) (05/29 0737) Pulse Rate:  [75-81] 75 (05/29 0629) Cardiac Rhythm: Normal sinus rhythm;Heart block (05/29 0745) Resp:  [22] 22 (05/28 1625) BP: (140-168)/(54-93) 152/76 (05/29 0737) SpO2:  [92 %-98 %] 93 % (05/29 0737)  Physical Exam: BP Readings from Last 1 Encounters:  06/18/18 (!) 152/76     Wt Readings from Last 1 Encounters:  06/07/18 126.6 kg    Weight change:  There is no height or weight on file to calculate BMI. HEENT: [place]/AT, Eyes-Brown, PERL, EOMI, Conjunctiva-Pink, Sclera-Non-icteric Neck: + JVD, No bruit, Trachea midline. Lungs:  Clearing, Bilateral. Cardiac:  Regular rhythm, normal S1 and S2, no S3. III/VI systolic murmur. Abdomen:  Soft, non-tender. BS present. Extremities:  Trace edema present. No cyanosis. No clubbing. CNS: AxOx3, Cranial nerves grossly intact, moves all 4 extremities.  Skin: Warm and dry.   Intake/Output from previous day: No intake/output data recorded.    Lab Results: BMET    Component Value Date/Time   NA 136 06/18/2018 0336   NA 136 06/17/2018 1806   NA 139 01/25/2018 1827   K 4.0 06/18/2018 0336   K 3.7 06/17/2018 1806   K 4.2 01/25/2018 1827   CL 92 (L) 06/18/2018 0336   CL 90 (L) 06/17/2018 1806   CL 99 01/25/2018 1827   CO2 32 06/18/2018 0336   CO2 33 (H) 06/17/2018 1806   CO2 26 01/25/2018 1827   GLUCOSE 116 (H) 06/18/2018 0336   GLUCOSE 90 06/17/2018 1806   GLUCOSE 122 (H) 01/25/2018 1827   BUN 19 06/18/2018 0336   BUN 11 06/17/2018 1806   BUN 78 (H) 01/25/2018 1827   CREATININE 5.39 (H) 06/18/2018 0336    CREATININE 4.48 (H) 06/17/2018 1806   CREATININE 9.85 (H) 01/25/2018 1827   CREATININE 2.86 (H) 10/10/2010 1140   CREATININE 2.20 (H) 06/26/2010 1637   CALCIUM 9.1 06/18/2018 0336   CALCIUM 9.2 06/17/2018 1806   CALCIUM 8.2 (L) 01/25/2018 1827   CALCIUM 8.7 01/08/2010 1121   CALCIUM 9.1 10/08/2009 0922   CALCIUM 8.6 07/30/2009 1102   GFRNONAA 7 (L) 06/18/2018 0336   GFRNONAA 9 (L) 06/17/2018 1806   GFRNONAA 4 (L) 01/25/2018 1827   GFRAA 8 (L) 06/18/2018 0336   GFRAA 10 (L) 06/17/2018 1806   GFRAA 4 (L) 01/25/2018 1827   CBC    Component Value Date/Time   WBC 6.5 06/18/2018 0336   RBC 3.05 (L) 06/18/2018 0336   HGB 8.5 (L) 06/18/2018 0336   HCT 27.3 (L) 06/18/2018 0336   PLT 214 06/18/2018 0336   MCV 89.5 06/18/2018 0336   MCH 27.9 06/18/2018 0336   MCHC 31.1 06/18/2018 0336   RDW 16.4 (H) 06/18/2018 0336   LYMPHSABS 1.9 06/17/2018 1806   MONOABS 1.1 (H) 06/17/2018 1806   EOSABS 0.5 06/17/2018 1806   BASOSABS 0.1 06/17/2018 1806   HEPATIC Function Panel Recent Labs    01/23/18 0410 01/24/18 0632 06/17/18 1806  PROT 6.9 6.5 7.9   HEMOGLOBIN A1C No components found for: HGA1C,  MPG CARDIAC ENZYMES Lab Results  Component Value Date   CKTOTAL 61  04/13/2010   CKMB 1.0 04/13/2010   TROPONINI <0.03 01/24/2018   TROPONINI <0.03 01/23/2018   TROPONINI <0.03 01/23/2018   BNP No results for input(s): PROBNP in the last 8760 hours. TSH Recent Labs    06/17/18 1806  TSH 1.825   CHOLESTEROL No results for input(s): CHOL in the last 8760 hours.  Scheduled Meds: . albuterol  2.5 mg Nebulization QID  . allopurinol  50 mg Oral Daily  . aspirin EC  81 mg Oral Daily  . atorvastatin  40 mg Oral Daily  . cinacalcet  90 mg Oral Q breakfast  . heparin  5,000 Units Subcutaneous Q8H  . hydrALAZINE  25 mg Oral BID  . insulin aspart  0-9 Units Subcutaneous TID WC  . [START ON 06/19/2018] midodrine  10 mg Oral Q T,Th,Sa-HD  . multivitamin with minerals  1 tablet Oral Daily   . sevelamer carbonate  3,200 mg Oral TID WC  . sodium chloride flush  3 mL Intravenous Q12H  . umeclidinium bromide  1 puff Inhalation Daily   Continuous Infusions: . sodium chloride     PRN Meds:.sodium chloride, sevelamer carbonate, sodium chloride flush  Assessment/Plan: Acute diastolic left heart failure, HFpEF ESRD Obesity Gout Hypertension Anemia of chronic disease OSA with CPAP use Mild mitral valve stenosis  Mild Aortic valve stenosise  Renal consult for dialysis. Add hydralazine if tolerated   LOS: 1 day    Dixie Dials  MD  06/18/2018, 10:56 AM

## 2018-06-19 LAB — CBC
HCT: 28.4 % — ABNORMAL LOW (ref 36.0–46.0)
Hemoglobin: 8.9 g/dL — ABNORMAL LOW (ref 12.0–15.0)
MCH: 28.1 pg (ref 26.0–34.0)
MCHC: 31.3 g/dL (ref 30.0–36.0)
MCV: 89.6 fL (ref 80.0–100.0)
Platelets: 243 10*3/uL (ref 150–400)
RBC: 3.17 MIL/uL — ABNORMAL LOW (ref 3.87–5.11)
RDW: 16.4 % — ABNORMAL HIGH (ref 11.5–15.5)
WBC: 7.8 10*3/uL (ref 4.0–10.5)
nRBC: 0.3 % — ABNORMAL HIGH (ref 0.0–0.2)

## 2018-06-19 LAB — GLUCOSE, CAPILLARY
Glucose-Capillary: 124 mg/dL — ABNORMAL HIGH (ref 70–99)
Glucose-Capillary: 135 mg/dL — ABNORMAL HIGH (ref 70–99)
Glucose-Capillary: 128 mg/dL — ABNORMAL HIGH (ref 70–99)
Glucose-Capillary: 111 mg/dL — ABNORMAL HIGH (ref 70–99)

## 2018-06-19 LAB — RENAL FUNCTION PANEL
Albumin: 3.1 g/dL — ABNORMAL LOW (ref 3.5–5.0)
Anion gap: 12 (ref 5–15)
BUN: 24 mg/dL — ABNORMAL HIGH (ref 8–23)
CO2: 28 mmol/L (ref 22–32)
Calcium: 9.6 mg/dL (ref 8.9–10.3)
Chloride: 94 mmol/L — ABNORMAL LOW (ref 98–111)
Creatinine, Ser: 6.19 mg/dL — ABNORMAL HIGH (ref 0.44–1.00)
GFR calc Af Amer: 7 mL/min — ABNORMAL LOW (ref >60)
GFR calc non Af Amer: 6 mL/min — ABNORMAL LOW (ref >60)
Glucose, Bld: 87 mg/dL (ref 70–99)
Phosphorus: 2.8 mg/dL (ref 2.5–4.6)
Potassium: 4.2 mmol/L (ref 3.5–5.1)
Sodium: 134 mmol/L — ABNORMAL LOW (ref 135–145)

## 2018-06-19 MED ORDER — DOXERCALCIFEROL 4 MCG/2ML IV SOLN
3.0000 ug | INTRAVENOUS | Status: DC
  Administered 2018-06-19 – 2018-06-24 (×3): 3 ug via INTRAVENOUS
  Filled 2018-06-19 (×2): qty 2

## 2018-06-19 MED ORDER — HEPARIN SODIUM (PORCINE) 1000 UNIT/ML IJ SOLN
INTRAMUSCULAR | Status: AC
  Administered 2018-06-19: 15:00:00 10000 [IU]
  Filled 2018-06-19: qty 10

## 2018-06-19 MED ORDER — DOXERCALCIFEROL 4 MCG/2ML IV SOLN
INTRAVENOUS | Status: AC
  Administered 2018-06-19: 15:00:00 3 ug via INTRAVENOUS
  Filled 2018-06-19: qty 2

## 2018-06-19 MED ORDER — ALPRAZOLAM 0.25 MG PO TABS
0.2500 mg | ORAL_TABLET | Freq: Two times a day (BID) | ORAL | Status: DC
  Administered 2018-06-19 – 2018-06-24 (×9): 0.25 mg via ORAL
  Filled 2018-06-19 (×9): qty 1

## 2018-06-19 MED ORDER — ALBUTEROL SULFATE (2.5 MG/3ML) 0.083% IN NEBU
2.5000 mg | INHALATION_SOLUTION | Freq: Four times a day (QID) | RESPIRATORY_TRACT | Status: DC
  Administered 2018-06-19 – 2018-06-24 (×15): 2.5 mg via RESPIRATORY_TRACT
  Filled 2018-06-19 (×17): qty 3

## 2018-06-19 NOTE — Progress Notes (Signed)
This note also relates to the following rows which could not be included: Resp - Cannot attach notes to unvalidated device data  RT set up CPAP and placed on patient with 2L O2 bled into circuit. Patient tolerating well at this time. RN is aware.  RT will monitor as needed.

## 2018-06-19 NOTE — Progress Notes (Addendum)
[facility] Progress Note   Subjective:  Seen in room. Still with some dyspnea and cough - improved slightly from yesterday. No CP. Dialyzed yesterday with 3L UF - plan is for another dialysis today with further fluid removal.  Objective Vitals:   06/18/18 1958 06/19/18 0029 06/19/18 0414 06/19/18 0800  BP: 126/69 128/71 119/60   Pulse: 80 83 81   Resp: 19 19 19    Temp: 98.1 F (36.7 C) 98.4 F (36.9 C) 98.5 F (36.9 C)   TempSrc: Oral Oral Oral   SpO2: 92% 93% 98% 93%  Weight:   120.6 kg    Physical Exam General: Elderly woman, NAD. On room air currently. Heart: RRR; no murmur Lungs: RLL rales; clear otherwise Abdomen: soft, non-tender Extremities: Trace LE edema Dialysis Access: LUE AVF + thrill  Additional Objective Labs: Basic Metabolic Panel: Recent Labs  Lab 06/17/18 1806 06/18/18 0336  NA 136 136  K 3.7 4.0  CL 90* 92*  CO2 33* 32  GLUCOSE 90 116*  BUN 11 19  CREATININE 4.48* 5.39*  CALCIUM 9.2 9.1   Liver Function Tests: Recent Labs  Lab 06/17/18 1806  AST 17  ALT 12  ALKPHOS 63  BILITOT 0.6  PROT 7.9  ALBUMIN 3.2*   CBC: Recent Labs  Lab 06/17/18 1806 06/18/18 0336  WBC 7.5 6.5  NEUTROABS 4.0  --   HGB 8.9* 8.5*  HCT 28.5* 27.3*  MCV 88.8 89.5  PLT 229 214   Blood Culture    Component Value Date/Time   SDES BLOOD BLOOD RIGHT ARM 01/23/2018 1708   SPECREQUEST AEROBIC BOTTLE ONLY Blood Culture adequate volume 01/23/2018 1708   CULT  01/23/2018 1708    NO GROWTH 5 DAYS Performed at [facility], [place] [address], [place], [place] [address]    REPTSTATUS 01/28/2018 FINAL 01/23/2018 1708    Cardiac Enzymes: Recent Labs  Lab 06/18/18 0944 06/18/18 1655 06/18/18 2109  TROPONINI 0.03* 0.03* 0.03*   CBG: Recent Labs  Lab 06/18/18 0735 06/18/18 1108 06/18/18 1652 06/18/18 2054 06/19/18 0736  GLUCAP 95 130* 98 140* 124*   Studies/Results: X-ray Chest Pa And Lateral  Result Date: 06/17/2018 CLINICAL  DATA:  Shortness of breath for 1 day. EXAM: CHEST - 2 VIEW COMPARISON:  CT chest 01/26/2018. PA and lateral chest 01/25/2018 and 04/02/2017. FINDINGS: There is cardiomegaly and interstitial edema. No pleural fluid. Atherosclerosis noted. No acute or focal bony abnormality. IMPRESSION: Cardiomegaly and interstitial edema. Atherosclerosis. Electronically Signed   By: Inge Rise M.D.   On: 06/17/2018 20:00   Medications: . sodium chloride     . allopurinol  50 mg Oral Daily  . aspirin EC  81 mg Oral Daily  . atorvastatin  40 mg Oral Daily  . Chlorhexidine Gluconate Cloth  6 each Topical Q0600  . Chlorhexidine Gluconate Cloth  6 each Topical Q0600  . cinacalcet  90 mg Oral Q breakfast  . heparin  5,000 Units Subcutaneous Q8H  . hydrALAZINE  25 mg Oral BID  . insulin aspart  0-9 Units Subcutaneous TID WC  . midodrine  10 mg Oral Q T,Th,Sa-HD  . multivitamin with minerals  1 tablet Oral Daily  . sevelamer carbonate  3,200 mg Oral TID WC  . sodium chloride flush  3 mL Intravenous Q12H  . umeclidinium bromide  1 puff Inhalation Daily    Dialysis Orders: TTS at [facility] 4.5hr, 450/A1.5, EDW 125kg, 2K/2Ca, AVF, heparin 13000 bolus - Mirc 100 q 2 weeks (  last 5/28) - Hectoral 35mcg IV q HD  Assessment/Plan: 1. Dyspnea/pulm edema: CXR with significant B edema on admit. Losing body weight, 3L UF with last HD - further volume off today. Will need much lower EDW on d/c. 2. ESRD: S/p extra HD 5/29 for volume, back to usual schedule today - HD today. 3. HypoTN/volume: BP chronically low (on midodrine) - follow. Lowering EDW, as above. 4. Anemia: Hgb 8.5 - not due for ESA yet. 5. Secondary hyperparathyroidism: Ca ok, continue home meds (binder/VDRA/sensipar).   Veneta Penton, PA-C 06/19/2018, 9:33 AM  [facility] Pager: [phone]  Pt seen, examined and agree w A/P as above.  Should be around 6-7kg under prior dry wt after HD today.  Pulm edema resolving clinically,  lungs clear today pre HD. Breathing better. After today no further HD planned this weekend, is on room air and SOB resolving.  Kelly Splinter  MD 06/19/2018, 4:52 PM

## 2018-06-19 NOTE — Progress Notes (Signed)
Ref: Tiffany Hatchet, MD   Subjective:  Minimal improvement in breathing. Also has anxiety. Hemodialysis x 2 in 24 hours.   Objective:  Vital Signs in the last 24 hours: Temp:  [97.6 F (36.4 C)-98.5 F (36.9 C)] 98.4 F (36.9 C) (05/30 1241) Pulse Rate:  [63-88] 88 (05/30 1241) Cardiac Rhythm: Normal sinus rhythm (05/30 0830) Resp:  [18-20] 20 (05/30 1241) BP: (102-137)/(52-71) 137/71 (05/30 1241) SpO2:  [92 %-100 %] 94 % (05/30 1241) Weight:  [120.6 kg-121 kg] 120.6 kg (05/30 0414)  Physical Exam: BP Readings from Last 1 Encounters:  06/19/18 137/71     Wt Readings from Last 1 Encounters:  06/19/18 120.6 kg    Weight change:  Body mass index is 44.25 kg/m. HEENT: [place]/AT, Eyes-Brown, PERL, EOMI, Artificial right eye, Conjunctiva-Pale pink, Sclera-Non-icteric Neck: No JVD, No bruit, Trachea midline. Lungs:  Clearing, Bilateral. Cardiac:  Regular rhythm, normal S1 and S2, no S3. II/VI systolic murmur. Abdomen:  Soft, non-tender. BS present. Extremities:  No edema present. No cyanosis. No clubbing. CNS: AxOx3, Cranial nerves grossly intact, moves all 4 extremities.  Skin: Warm and dry.   Intake/Output from previous day: 05/29 0701 - 05/30 0700 In: 720 [P.O.:720] Out: 3000     Lab Results: BMET    Component Value Date/Time   NA 134 (L) 06/19/2018 1414   NA 136 06/18/2018 0336   NA 136 06/17/2018 1806   K 4.2 06/19/2018 1414   K 4.0 06/18/2018 0336   K 3.7 06/17/2018 1806   CL 94 (L) 06/19/2018 1414   CL 92 (L) 06/18/2018 0336   CL 90 (L) 06/17/2018 1806   CO2 28 06/19/2018 1414   CO2 32 06/18/2018 0336   CO2 33 (H) 06/17/2018 1806   GLUCOSE 87 06/19/2018 1414   GLUCOSE 116 (H) 06/18/2018 0336   GLUCOSE 90 06/17/2018 1806   BUN 24 (H) 06/19/2018 1414   BUN 19 06/18/2018 0336   BUN 11 06/17/2018 1806   CREATININE 6.19 (H) 06/19/2018 1414   CREATININE 5.39 (H) 06/18/2018 0336   CREATININE 4.48 (H) 06/17/2018 1806   CREATININE 2.86 (H) 10/10/2010 1140   CREATININE 2.20 (H) 06/26/2010 1637   CALCIUM 9.6 06/19/2018 1414   CALCIUM 9.1 06/18/2018 0336   CALCIUM 9.2 06/17/2018 1806   CALCIUM 8.7 01/08/2010 1121   CALCIUM 9.1 10/08/2009 0922   CALCIUM 8.6 07/30/2009 1102   GFRNONAA 6 (L) 06/19/2018 1414   GFRNONAA 7 (L) 06/18/2018 0336   GFRNONAA 9 (L) 06/17/2018 1806   GFRAA 7 (L) 06/19/2018 1414   GFRAA 8 (L) 06/18/2018 0336   GFRAA 10 (L) 06/17/2018 1806   CBC    Component Value Date/Time   WBC 7.8 06/19/2018 1414   RBC 3.17 (L) 06/19/2018 1414   HGB 8.9 (L) 06/19/2018 1414   HCT 28.4 (L) 06/19/2018 1414   PLT 243 06/19/2018 1414   MCV 89.6 06/19/2018 1414   MCH 28.1 06/19/2018 1414   MCHC 31.3 06/19/2018 1414   RDW 16.4 (H) 06/19/2018 1414   LYMPHSABS 1.9 06/17/2018 1806   MONOABS 1.1 (H) 06/17/2018 1806   EOSABS 0.5 06/17/2018 1806   BASOSABS 0.1 06/17/2018 1806   HEPATIC Function Panel Recent Labs    01/23/18 0410 01/24/18 0632 06/17/18 1806  PROT 6.9 6.5 7.9   HEMOGLOBIN A1C No components found for: HGA1C,  MPG CARDIAC ENZYMES Lab Results  Component Value Date   CKTOTAL 87 04/13/2010   CKMB 1.0 04/13/2010   TROPONINI 0.03 (HH) 06/18/2018  TROPONINI 0.03 (HH) 06/18/2018   TROPONINI 0.03 (HH) 06/18/2018   BNP No results for input(s): PROBNP in the last 8760 hours. TSH Recent Labs    06/17/18 1806  TSH 1.825   CHOLESTEROL No results for input(s): CHOL in the last 8760 hours.  Scheduled Meds: . allopurinol  50 mg Oral Daily  . ALPRAZolam  0.25 mg Oral BID  . aspirin EC  81 mg Oral Daily  . atorvastatin  40 mg Oral Daily  . Chlorhexidine Gluconate Cloth  6 each Topical Q0600  . Chlorhexidine Gluconate Cloth  6 each Topical Q0600  . cinacalcet  90 mg Oral Q breakfast  . doxercalciferol  3 mcg Intravenous Q T,Th,Sa-HD  . heparin  5,000 Units Subcutaneous Q8H  . hydrALAZINE  25 mg Oral BID  . insulin aspart  0-9 Units Subcutaneous TID WC  . midodrine  10 mg Oral Q T,Th,Sa-HD  . multivitamin with  minerals  1 tablet Oral Daily  . sevelamer carbonate  3,200 mg Oral TID WC  . sodium chloride flush  3 mL Intravenous Q12H  . umeclidinium bromide  1 puff Inhalation Daily   Continuous Infusions: . sodium chloride     PRN Meds:.sodium chloride, sevelamer carbonate, sodium chloride flush  Assessment/Plan: Acute diastolic left heart failure, HFpEF ESRD Obesity Gout Hypertension Anemia of chronic disease OSA with CPAP use Mild MV stenosis Mild aortic valve stenosis  Continue medial treatment. Add alprazolam for anxiety   LOS: 2 days    Dixie Dials  MD  06/19/2018, 3:19 PM

## 2018-06-19 NOTE — Plan of Care (Signed)
  Problem: Coping: Goal: Level of anxiety will decrease Outcome: Progressing Note: No s/s of anxiety noted.   

## 2018-06-20 ENCOUNTER — Encounter (HOSPITAL_COMMUNITY): Payer: Self-pay | Admitting: Nephrology

## 2018-06-20 ENCOUNTER — Inpatient Hospital Stay (HOSPITAL_COMMUNITY): Payer: Medicare Other

## 2018-06-20 LAB — GLUCOSE, CAPILLARY
Glucose-Capillary: 97 mg/dL (ref 70–99)
Glucose-Capillary: 166 mg/dL — ABNORMAL HIGH (ref 70–99)
Glucose-Capillary: 146 mg/dL — ABNORMAL HIGH (ref 70–99)
Glucose-Capillary: 272 mg/dL — ABNORMAL HIGH (ref 70–99)

## 2018-06-20 MED ORDER — METHYLPREDNISOLONE SODIUM SUCC 125 MG IJ SOLR
60.0000 mg | Freq: Every day | INTRAMUSCULAR | Status: AC
  Administered 2018-06-20 – 2018-06-21 (×2): 60 mg via INTRAVENOUS
  Filled 2018-06-20 (×2): qty 2

## 2018-06-20 NOTE — Progress Notes (Addendum)
[facility] Progress Note   Subjective: Seen in room - reports was cramping in legs and abdomen all night - started after dialysis yesterday - we possibly pulled her EDW down a little too far. No CP/dyspnea.   Objective Vitals:   06/19/18 2312 06/20/18 0521 06/20/18 0759 06/20/18 0900  BP:  111/67  109/63  Pulse: 84 (!) 47  79  Resp: (!) 22 (!) 25    Temp:  97.7 F (36.5 C)    TempSrc:  Oral    SpO2: 96% 94% 94% 94%  Weight:  118.6 kg     Physical Exam General: Elderly woman, NAD. On room air currently. Heart: RRR; no murmur Lungs: CTA anteriorly Abdomen: soft, non-tender Extremities: No LE edema Dialysis Access: LUE AVF + thrill  Additional Objective Labs: Basic Metabolic Panel: Recent Labs  Lab 06/17/18 1806 06/18/18 0336 06/19/18 1414  NA 136 136 134*  K 3.7 4.0 4.2  CL 90* 92* 94*  CO2 33* 32 28  GLUCOSE 90 116* 87  BUN 11 19 24*  CREATININE 4.48* 5.39* 6.19*  CALCIUM 9.2 9.1 9.6  PHOS  --   --  2.8   Liver Function Tests: Recent Labs  Lab 06/17/18 1806 06/19/18 1414  AST 17  --   ALT 12  --   ALKPHOS 63  --   BILITOT 0.6  --   PROT 7.9  --   ALBUMIN 3.2* 3.1*   CBC: Recent Labs  Lab 06/17/18 1806 06/18/18 0336 06/19/18 1414  WBC 7.5 6.5 7.8  NEUTROABS 4.0  --   --   HGB 8.9* 8.5* 8.9*  HCT 28.5* 27.3* 28.4*  MCV 88.8 89.5 89.6  PLT 229 214 243   Cardiac Enzymes: Recent Labs  Lab 06/18/18 0944 06/18/18 1655 06/18/18 2109  TROPONINI 0.03* 0.03* 0.03*   CBG: Recent Labs  Lab 06/19/18 0736 06/19/18 1124 06/19/18 1733 06/19/18 2115 06/20/18 0815  GLUCAP 124* 135* 128* 111* 97   Medications: . sodium chloride     . albuterol  2.5 mg Nebulization QID  . allopurinol  50 mg Oral Daily  . ALPRAZolam  0.25 mg Oral BID  . aspirin EC  81 mg Oral Daily  . atorvastatin  40 mg Oral Daily  . Chlorhexidine Gluconate Cloth  6 each Topical Q0600  . Chlorhexidine Gluconate Cloth  6 each Topical Q0600  . cinacalcet  90 mg  Oral Q breakfast  . doxercalciferol  3 mcg Intravenous Q T,Th,Sa-HD  . heparin  5,000 Units Subcutaneous Q8H  . hydrALAZINE  25 mg Oral BID  . insulin aspart  0-9 Units Subcutaneous TID WC  . midodrine  10 mg Oral Q T,Th,Sa-HD  . multivitamin with minerals  1 tablet Oral Daily  . sevelamer carbonate  3,200 mg Oral TID WC  . sodium chloride flush  3 mL Intravenous Q12H  . umeclidinium bromide  1 puff Inhalation Daily    Dialysis Orders: TTS at [facility] 4.5hr, 450/A1.5, EDW 125kg, 2K/2Ca, AVF, heparin 13000 bolus - Mirc 100 q 2 weeks (last 5/28) - Hectoral 70mcg IV q HD  Assessment/Plan: 1. Dyspnea/pulm edema: CXR with significant B edema on admit. Losing body weight, 3L UF with HD 5/29 and another 2.7L net 5/30. Dyspnea resolved, but cramping overnight - will aim for new EDW 119kg. Repeat CXR , walked in room this am w/ sat's in low 90's.  100% sat sitting. Lives alone, worried about her falling, etc.  Have d/w primary MD, consult PT,  may need SNF.  2. ESRD: S/p extra HD on admit - now back to usual TTS schedule, next 6/2 if still here. 3. HypoTN/volume: BP chronically low (on midodrine) - follow. Lowering EDW, as above. 4. Anemia: Hgb 8.9 - not due for ESA yet. 5. Secondary hyperparathyroidism: Ca ok, continue home meds (binder/VDRA/sensipar).  Veneta Penton, PA-C 06/20/2018, 9:35 AM  [facility] Pager: [phone]  Pt seen, examined and agree w assess/plan as above with additions as indicated.  [facility] 06/20/2018, 10:49 AM

## 2018-06-20 NOTE — Progress Notes (Signed)
Ref: [name], MD   Subjective:  Exertional and rest respiratory distress continues with Oxygen sat 94 %.   Objective:  Vital Signs in the last 24 hours: Temp:  [97.5 F (36.4 C)-98.5 F (36.9 C)] 97.7 F (36.5 C) (05/31 0521) Pulse Rate:  [47-88] 79 (05/31 0900) Cardiac Rhythm: Normal sinus rhythm (05/31 0902) Resp:  [19-25] 25 (05/31 0521) BP: (83-137)/(28-78) 109/63 (05/31 0900) SpO2:  [94 %-100 %] 94 % (05/31 0900) Weight:  [118.6 kg-120.5 kg] 118.6 kg (05/31 0521)  Physical Exam: BP Readings from Last 1 Encounters:  06/20/18 109/63     Wt Readings from Last 1 Encounters:  06/20/18 118.6 kg    Weight change: -3.5 kg Body mass index is 43.52 kg/m. HEENT: [place]/AT, Eyes-Brown, PERL, EOMI, Conjunctiva-Pale pink, Sclera-Non-icteric Neck: No JVD, No bruit, Trachea midline. Lungs:  Basal crackles, Bilateral. Poor air entry. Cardiac:  Regular rhythm, normal S1 and S2, no S3. II/VI systolic murmur. Abdomen:  Soft, non-tender. BS present. Extremities:  No edema present. No cyanosis. No clubbing. CNS: AxOx3, Cranial nerves grossly intact, moves all 4 extremities.  Skin: Warm and dry.   Intake/Output from previous day: 05/30 0701 - 05/31 0700 In: 483 [P.O.:480; I.V.:3] Out: 2700     Lab Results: BMET    Component Value Date/Time   NA 134 (L) 06/19/2018 1414   NA 136 06/18/2018 0336   NA 136 06/17/2018 1806   K 4.2 06/19/2018 1414   K 4.0 06/18/2018 0336   K 3.7 06/17/2018 1806   CL 94 (L) 06/19/2018 1414   CL 92 (L) 06/18/2018 0336   CL 90 (L) 06/17/2018 1806   CO2 28 06/19/2018 1414   CO2 32 06/18/2018 0336   CO2 33 (H) 06/17/2018 1806   GLUCOSE 87 06/19/2018 1414   GLUCOSE 116 (H) 06/18/2018 0336   GLUCOSE 90 06/17/2018 1806   BUN 24 (H) 06/19/2018 1414   BUN 19 06/18/2018 0336   BUN 11 06/17/2018 1806   CREATININE 6.19 (H) 06/19/2018 1414   CREATININE 5.39 (H) 06/18/2018 0336   CREATININE 4.48 (H) 06/17/2018 1806   CREATININE 2.86 (H) 10/10/2010  1140   CREATININE 2.20 (H) 06/26/2010 1637   CALCIUM 9.6 06/19/2018 1414   CALCIUM 9.1 06/18/2018 0336   CALCIUM 9.2 06/17/2018 1806   CALCIUM 8.7 01/08/2010 1121   CALCIUM 9.1 10/08/2009 0922   CALCIUM 8.6 07/30/2009 1102   GFRNONAA 6 (L) 06/19/2018 1414   GFRNONAA 7 (L) 06/18/2018 0336   GFRNONAA 9 (L) 06/17/2018 1806   GFRAA 7 (L) 06/19/2018 1414   GFRAA 8 (L) 06/18/2018 0336   GFRAA 10 (L) 06/17/2018 1806   CBC    Component Value Date/Time   WBC 7.8 06/19/2018 1414   RBC 3.17 (L) 06/19/2018 1414   HGB 8.9 (L) 06/19/2018 1414   HCT 28.4 (L) 06/19/2018 1414   PLT 243 06/19/2018 1414   MCV 89.6 06/19/2018 1414   MCH 28.1 06/19/2018 1414   MCHC 31.3 06/19/2018 1414   RDW 16.4 (H) 06/19/2018 1414   LYMPHSABS 1.9 06/17/2018 1806   MONOABS 1.1 (H) 06/17/2018 1806   EOSABS 0.5 06/17/2018 1806   BASOSABS 0.1 06/17/2018 1806   HEPATIC Function Panel Recent Labs    01/23/18 0410 01/24/18 0632 06/17/18 1806  PROT 6.9 6.5 7.9   HEMOGLOBIN A1C No components found for: HGA1C,  MPG CARDIAC ENZYMES Lab Results  Component Value Date   CKTOTAL 87 04/13/2010   CKMB 1.0 04/13/2010   TROPONINI 0.03 (HH) 06/18/2018  TROPONINI 0.03 (HH) 06/18/2018   TROPONINI 0.03 (HH) 06/18/2018   BNP No results for input(s): PROBNP in the last 8760 hours. TSH Recent Labs    06/17/18 1806  TSH 1.825   CHOLESTEROL No results for input(s): CHOL in the last 8760 hours.  Scheduled Meds: . albuterol  2.5 mg Nebulization QID  . allopurinol  50 mg Oral Daily  . ALPRAZolam  0.25 mg Oral BID  . aspirin EC  81 mg Oral Daily  . atorvastatin  40 mg Oral Daily  . Chlorhexidine Gluconate Cloth  6 each Topical Q0600  . Chlorhexidine Gluconate Cloth  6 each Topical Q0600  . cinacalcet  90 mg Oral Q breakfast  . doxercalciferol  3 mcg Intravenous Q T,Th,Sa-HD  . heparin  5,000 Units Subcutaneous Q8H  . hydrALAZINE  25 mg Oral BID  . insulin aspart  0-9 Units Subcutaneous TID WC  .  methylPREDNISolone (SOLU-MEDROL) injection  60 mg Intravenous Daily  . midodrine  10 mg Oral Q T,Th,Sa-HD  . multivitamin with minerals  1 tablet Oral Daily  . sevelamer carbonate  3,200 mg Oral TID WC  . sodium chloride flush  3 mL Intravenous Q12H  . umeclidinium bromide  1 puff Inhalation Daily   Continuous Infusions: . sodium chloride     PRN Meds:.sodium chloride, sevelamer carbonate, sodium chloride flush  Assessment/Plan: Acute diastolic left heart failure, HFpEF ESRD COPD exacerbation Hypertension Anemia of chronic disease OSA with CPAP use Mild MV stenosis Mild AV stenosis  Add solumedrol and continue nebulizer Tx. CXR pending.    LOS: 3 days    Dixie Dials  MD  06/20/2018, 10:30 AM

## 2018-06-21 LAB — GLUCOSE, CAPILLARY
Glucose-Capillary: 132 mg/dL — ABNORMAL HIGH (ref 70–99)
Glucose-Capillary: 175 mg/dL — ABNORMAL HIGH (ref 70–99)
Glucose-Capillary: 187 mg/dL — ABNORMAL HIGH (ref 70–99)
Glucose-Capillary: 258 mg/dL — ABNORMAL HIGH (ref 70–99)

## 2018-06-21 MED ORDER — HEPARIN SODIUM (PORCINE) 1000 UNIT/ML DIALYSIS
100.0000 [IU]/kg | INTRAMUSCULAR | Status: DC | PRN
  Administered 2018-06-21: 12000 [IU] via INTRAVENOUS_CENTRAL

## 2018-06-21 MED ORDER — PREDNISONE 20 MG PO TABS
20.0000 mg | ORAL_TABLET | Freq: Every day | ORAL | Status: AC
  Administered 2018-06-22 – 2018-06-23 (×2): 20 mg via ORAL
  Filled 2018-06-21 (×2): qty 1

## 2018-06-21 MED ORDER — HEPARIN SODIUM (PORCINE) 1000 UNIT/ML IJ SOLN
INTRAMUSCULAR | Status: AC
  Filled 2018-06-21: qty 10

## 2018-06-21 MED ORDER — HEPARIN SODIUM (PORCINE) 1000 UNIT/ML DIALYSIS: 100.0000 [IU]/kg | INTRAMUSCULAR | Status: DC | PRN

## 2018-06-21 NOTE — TOC Initial Note (Signed)
Transition of Care [facility]) - Initial/Assessment Note    Patient Details  Name: [name] MRN: [id number] Date of Birth: 1945/06/29  Transition of Care [facility]) CM/SW Contact:    Bethena Roys, RN Phone Number: 06/21/2018, 4:06 PM  Clinical Narrative:  Pt presented for Shortness of Breath- hx ESRD TTS HD Days. PTA from home alone. PT recommendations for SNF- pt is agreeable. Patient has been to [facility] previously and [facility] is first choice. FL2 completed- awaiting to see if SNF willing to accept the patient. CM will continue to monitor for additional transition of care needs.                Expected Discharge Plan: Skilled Nursing Facility Barriers to Discharge: Continued Medical Work up   Patient Goals and CMS Choice Patient states their goals for this hospitalization and ongoing recovery are:: "to regain some strength" CMS Medicare.gov Compare Post Acute Care list provided to:: (Patient knew which facility she wanted) Choice offered to / list presented to : Patient  Expected Discharge Plan and Services Expected Discharge Plan: [place] In-house Referral: NA Discharge Planning Services: CM Consult Post Acute Care Choice: [place] Living arrangements for the past 2 months: Single Family Home                 DME Arranged: N/A DME Agency: NA       HH Arranged: NA HH Agency: NA        Prior Living Arrangements/Services Living arrangements for the past 2 months: Single Family Home Lives with:: Self Patient language and need for interpreter reviewed:: Yes Do you feel safe going back to the place where you live?: (Wants to go to facility initially then home. )      Need for Family Participation in Patient Care: Yes (Comment) Care giver support system in place?: Yes (comment) Current home services: DME(Pt has cane, RW with seat. ) Criminal Activity/Legal Involvement Pertinent to Current  Situation/Hospitalization: No - Comment as needed  Activities of Daily Living Home Assistive Devices/Equipment: Cane (specify quad or straight), Eyeglasses, Nebulizer ADL Screening (condition at time of admission) Patient's cognitive ability adequate to safely complete daily activities?: Yes Is the patient deaf or have difficulty hearing?: No Does the patient have difficulty seeing, even when wearing glasses/contacts?: No Does the patient have difficulty concentrating, remembering, or making decisions?: No Patient able to express need for assistance with ADLs?: Yes Does the patient have difficulty dressing or bathing?: No Independently performs ADLs?: Yes (appropriate for developmental age) Does the patient have difficulty walking or climbing stairs?: Yes Weakness of Legs: Both Weakness of Arms/Hands: None  Permission Sought/Granted Permission sought to share information with : Case Manager                Emotional Assessment Appearance:: Appears stated age Attitude/Demeanor/Rapport: Engaged Affect (typically observed): Accepting Orientation: : Oriented to Self, Oriented to Situation, Oriented to Place, Oriented to  Time Alcohol / Substance Use: Not Applicable Psych Involvement: No (comment)  Admission diagnosis:  CHF Patient Active Problem List   Diagnosis Date Noted  . Shortness of breath 06/17/2018  . Acute left systolic heart failure ([place]) 06/17/2018  . Eye pain, left 01/25/2018  . Acute bronchitis due to Rhinovirus 01/18/2018  . Respiratory distress, acute 01/18/2018  . Essential hypertension 01/17/2018  . HLD (hyperlipidemia) 01/17/2018  . Chronic diastolic CHF (congestive heart failure) ([place]) 01/16/2018  . Centrilobular emphysema ([place]) 01/16/2018  . Acute bronchitis 01/16/2018  .  Heme + stool 08/24/2017  . Iron deficiency anemia, unspecified 08/24/2017  . Chest pain 04/06/2017  . Unstable angina ([place]) 04/02/2017  . Hypoxemia 10/13/2016  . Acute on chronic  combined systolic and diastolic CHF (congestive heart failure) ([place]) 10/13/2016  . Anemia due to chronic kidney disease 10/13/2016  . Encephalopathy, metabolic   . Tremor   . Weakness   . Acute hyperkalemia 12/04/2014  . Neurological deficit present 12/04/2014  . Hypercalcemia 12/04/2014  . OSA    . Myocardial infarction ([place])   . Blind right eye   . Coronary artery disease   . CHF (congestive heart failure) ([place])   . ESRD needing dialysis ([place])   . Hyperkalemia 08/05/2014  . Type II diabetes mellitus with renal manifestations ([place]) 01/18/2014  . Arm pain 02/27/2012  . Other complications due to renal dialysis device, implant, and graft 02/13/2012  . Pain in limb 11/21/2011  . Swollen limb 11/21/2011  . Itching 11/21/2011  . End stage renal disease ([place]) 11/07/2011  . Chronic kidney disease, stage IV (severe) ([place]) 10/24/2011  . Chronic kidney disease (CKD), stage IV (severe) (HCC) 08/29/2011   PCP:  [name], MD Pharmacy:   [facility] [place], [address] AT [address] [place] [address] Phone: [phone] Fax: [phone]     Social Determinants of Health (SDOH) Interventions    Readmission Risk Interventions Readmission Risk Prevention Plan 06/21/2018  Transportation Screening Complete  HRI or [place] Complete  Social Work Consult for [place] Planning/Counseling Complete  Medication Review Press photographer) Complete  Some recent data might be hidden

## 2018-06-21 NOTE — Progress Notes (Addendum)
CSW attempted to complete the assessment with the patient. The patient was in hemodialysis. Recommendation is for SNF. CSW will attempt to assess the patient at a later time.   CSW will continue to follow and assist with disposition planning.   Domenic Schwab, MSW, [place]

## 2018-06-21 NOTE — Procedures (Signed)
Patient seen on Hemodialysis. BP 117/60 (BP Location: Right Arm)   Pulse 78   Temp 97.9 F (36.6 C) (Oral)   Resp (!) 21   Ht 5\' 5"  (1.651 m)   Wt 122.4 kg   SpO2 94%   BMI 44.90 kg/m   QB 400, UF goal 2L Tolerating treatment without complaints at this time.   Elmarie Shiley MD [facility]. Office # [phone] Pager # [phone] 1:52 PM

## 2018-06-21 NOTE — NC FL2 (Signed)
[place] MEDICAID FL2 LEVEL OF CARE SCREENING TOOL     IDENTIFICATION  Patient Name: [name] Birthdate: 1945/08/11 Sex: female Admission Date (Current Location): 06/17/2018  [facility] and [place] Number:  Herbalist and Address:  [facility], [place] [address] [place], [place] [address]      Provider Number: [id number]  Attending Physician Name and Address:  Dixie Dials, MD  Relative Name and Phone Number:  Leatha Gilding [phone]    Current Level of Care: Hospital Recommended Level of Care: [place] Prior Approval Number:    Date Approved/Denied:   PASRR Number: [id number]  Discharge Plan: SNF    Current Diagnoses: Patient Active Problem List   Diagnosis Date Noted  . Shortness of breath 06/17/2018  . Acute left systolic heart failure ([place]) 06/17/2018  . Eye pain, left 01/25/2018  . Acute bronchitis due to Rhinovirus 01/18/2018  . Respiratory distress, acute 01/18/2018  . Essential hypertension 01/17/2018  . HLD (hyperlipidemia) 01/17/2018  . Chronic diastolic CHF (congestive heart failure) ([place]) 01/16/2018  . Centrilobular emphysema ([place]) 01/16/2018  . Acute bronchitis 01/16/2018  . Heme + stool 08/24/2017  . Iron deficiency anemia, unspecified 08/24/2017  . Chest pain 04/06/2017  . Unstable angina ([place]) 04/02/2017  . Hypoxemia 10/13/2016  . Acute on chronic combined systolic and diastolic CHF (congestive heart failure) ([place]) 10/13/2016  . Anemia due to chronic kidney disease 10/13/2016  . Encephalopathy, metabolic   . Tremor   . Weakness   . Acute hyperkalemia 12/04/2014  . Neurological deficit present 12/04/2014  . Hypercalcemia 12/04/2014  . OSA    . Myocardial infarction ([place])   . Blind right eye   . Coronary artery disease   . CHF (congestive heart failure) ([place])   . ESRD needing dialysis ([place])   . Hyperkalemia 08/05/2014  . Type II diabetes mellitus with renal manifestations ([place])  01/18/2014  . Arm pain 02/27/2012  . Other complications due to renal dialysis device, implant, and graft 02/13/2012  . Pain in limb 11/21/2011  . Swollen limb 11/21/2011  . Itching 11/21/2011  . End stage renal disease ([place]) 11/07/2011  . Chronic kidney disease, stage IV (severe) ([place]) 10/24/2011  . Chronic kidney disease (CKD), stage IV (severe) ([place]) 08/29/2011    Orientation RESPIRATION BLADDER Height & Weight     Self, Time, Situation, Place  Normal Continent Weight: 120 kg Height:  5\' 5"  (165.1 cm)  BEHAVIORAL SYMPTOMS/MOOD NEUROLOGICAL BOWEL NUTRITION STATUS      Continent Diet(Carb modified-thin liquids)  AMBULATORY STATUS COMMUNICATION OF NEEDS Skin   Limited Assist Verbally Normal                       Personal Care Assistance Level of Assistance  Bathing, Dressing, Feeding Bathing Assistance: Limited assistance Feeding assistance: Independent Dressing Assistance: Limited assistance     Functional Limitations Info  Sight, Hearing, Speech Sight Info: Adequate Hearing Info: Adequate Speech Info: Adequate    SPECIAL CARE FACTORS FREQUENCY  PT (By licensed PT), OT (By licensed OT)     PT Frequency: 5 x week OT Frequency: 5 x week            Contractures Contractures Info: Not present    Additional Factors Info  Code Status, Allergies, Insulin Sliding Scale, Psychotropic Code Status Info: Full Allergies Info: Lyrica Psychotropic Info: xanax 0.25 mg bid Insulin Sliding Scale Info: 3 x day with meals insulin aspart (novoLOG) injection 0-9 Units  Current Medications (06/21/2018):  This is the current hospital active medication list Current Facility-Administered Medications  Medication Dose Route Frequency Provider Last Rate Last Dose  . 0.9 %  sodium chloride infusion  250 mL Intravenous PRN Dixie Dials, MD      . albuterol (PROVENTIL) (2.5 MG/3ML) 0.083% nebulizer solution 2.5 mg  2.5 mg Nebulization QID Dixie Dials, MD   2.5 mg at 06/21/18  0750  . allopurinol (ZYLOPRIM) tablet 50 mg  50 mg Oral Daily Dixie Dials, MD   50 mg at 06/21/18 0803  . ALPRAZolam Duanne Moron) tablet 0.25 mg  0.25 mg Oral BID Dixie Dials, MD   0.25 mg at 06/21/18 0803  . aspirin EC tablet 81 mg  81 mg Oral Daily Dixie Dials, MD   81 mg at 06/21/18 0803  . atorvastatin (LIPITOR) tablet 40 mg  40 mg Oral Daily Dixie Dials, MD   40 mg at 06/21/18 0803  . Chlorhexidine Gluconate Cloth 2 % PADS 6 each  6 each Topical Q0600 Roney Jaffe, MD   6 each at 06/18/18 1741  . Chlorhexidine Gluconate Cloth 2 % PADS 6 each  6 each Topical Q0600 Roney Jaffe, MD      . cinacalcet [facility]) tablet 90 mg  90 mg Oral Q breakfast Dixie Dials, MD   90 mg at 06/21/18 0803  . doxercalciferol (HECTOROL) injection 3 mcg  3 mcg Intravenous Q T,Th,Sa-HD Loren Racer, PA-C   3 mcg at 06/19/18 1436  . heparin injection 5,000 Units  5,000 Units Subcutaneous Q8H Dixie Dials, MD   5,000 Units at 06/21/18 0600  . hydrALAZINE (APRESOLINE) tablet 25 mg  25 mg Oral BID Dixie Dials, MD   25 mg at 06/21/18 0803  . insulin aspart (novoLOG) injection 0-9 Units  0-9 Units Subcutaneous TID WC Dixie Dials, MD   1 Units at 06/20/18 1757  . midodrine (PROAMATINE) tablet 10 mg  10 mg Oral Q T,Th,Sa-HD Dixie Dials, MD   10 mg at 06/19/18 1235  . multivitamin with minerals tablet 1 tablet  1 tablet Oral Daily Dixie Dials, MD   1 tablet at 06/21/18 0803  . [START ON 06/22/2018] predniSONE (DELTASONE) tablet 20 mg  20 mg Oral QAC breakfast Dixie Dials, MD      . sevelamer carbonate (RENVELA) tablet 1,600 mg  1,600 mg Oral BID PRN Dixie Dials, MD      . sevelamer carbonate (RENVELA) tablet 3,200 mg  3,200 mg Oral TID WC Dixie Dials, MD   3,200 mg at 06/21/18 1532  . sodium chloride flush (NS) 0.9 % injection 3 mL  3 mL Intravenous Q12H Dixie Dials, MD   3 mL at 06/21/18 0804  . sodium chloride flush (NS) 0.9 % injection 3 mL  3 mL Intravenous PRN Dixie Dials, MD      .  umeclidinium bromide (INCRUSE ELLIPTA) 62.5 MCG/INH 1 puff  1 puff Inhalation Daily Dixie Dials, MD   1 puff at 06/21/18 0750     Discharge Medications: Please see discharge summary for a list of discharge medications.  Relevant Imaging Results:  Relevant Lab Results:   Additional Information [id number]  Bethena Roys, RN

## 2018-06-21 NOTE — Progress Notes (Signed)
Ref: [name], MD   Subjective:  Respiratory distress continues. Oxygen sat at 93 %.  Objective:  Vital Signs in the last 24 hours: Temp:  [97.4 F (36.3 C)-98.7 F (37.1 C)] 97.4 F (36.3 C) (06/01 0542) Pulse Rate:  [72-89] 72 (06/01 0542) Cardiac Rhythm: Normal sinus rhythm (06/01 0811) Resp:  [20-27] 24 (06/01 0811) BP: (113-132)/(51-69) 124/61 (06/01 0811) SpO2:  [91 %-100 %] 93 % (06/01 0751) Weight:  [119.5 kg] 119.5 kg (06/01 0542)  Physical Exam: BP Readings from Last 1 Encounters:  06/21/18 124/61     Wt Readings from Last 1 Encounters:  06/21/18 119.5 kg    Weight change: -0.977 kg Body mass index is 43.85 kg/m. HEENT: [place]/AT, Eyes-Brown, PERL, EOMI, Conjunctiva-Piale pnk, Sclera-Non-icteric Neck: No JVD, No bruit, Trachea midline. Lungs:  Fine basal carckles, Bilateral. Cardiac:  Regular rhythm, normal S1 and S2, no S3. II/VI systolic murmur. Abdomen:  Soft, non-tender. BS present. Extremities:  No edema present. No cyanosis. No clubbing. CNS: AxOx3, Cranial nerves grossly intact, moves all 4 extremities.  Skin: Warm and dry.   Intake/Output from previous day: 05/31 0701 - 06/01 0700 In: 720 [P.O.:720] Out: -     Lab Results: BMET    Component Value Date/Time   NA 134 (L) 06/19/2018 1414   NA 136 06/18/2018 0336   NA 136 06/17/2018 1806   K 4.2 06/19/2018 1414   K 4.0 06/18/2018 0336   K 3.7 06/17/2018 1806   CL 94 (L) 06/19/2018 1414   CL 92 (L) 06/18/2018 0336   CL 90 (L) 06/17/2018 1806   CO2 28 06/19/2018 1414   CO2 32 06/18/2018 0336   CO2 33 (H) 06/17/2018 1806   GLUCOSE 87 06/19/2018 1414   GLUCOSE 116 (H) 06/18/2018 0336   GLUCOSE 90 06/17/2018 1806   BUN 24 (H) 06/19/2018 1414   BUN 19 06/18/2018 0336   BUN 11 06/17/2018 1806   CREATININE 6.19 (H) 06/19/2018 1414   CREATININE 5.39 (H) 06/18/2018 0336   CREATININE 4.48 (H) 06/17/2018 1806   CREATININE 2.86 (H) 10/10/2010 1140   CREATININE 2.20 (H) 06/26/2010 1637   CALCIUM 9.6 06/19/2018 1414   CALCIUM 9.1 06/18/2018 0336   CALCIUM 9.2 06/17/2018 1806   CALCIUM 8.7 01/08/2010 1121   CALCIUM 9.1 10/08/2009 0922   CALCIUM 8.6 07/30/2009 1102   GFRNONAA 6 (L) 06/19/2018 1414   GFRNONAA 7 (L) 06/18/2018 0336   GFRNONAA 9 (L) 06/17/2018 1806   GFRAA 7 (L) 06/19/2018 1414   GFRAA 8 (L) 06/18/2018 0336   GFRAA 10 (L) 06/17/2018 1806   CBC    Component Value Date/Time   WBC 7.8 06/19/2018 1414   RBC 3.17 (L) 06/19/2018 1414   HGB 8.9 (L) 06/19/2018 1414   HCT 28.4 (L) 06/19/2018 1414   PLT 243 06/19/2018 1414   MCV 89.6 06/19/2018 1414   MCH 28.1 06/19/2018 1414   MCHC 31.3 06/19/2018 1414   RDW 16.4 (H) 06/19/2018 1414   LYMPHSABS 1.9 06/17/2018 1806   MONOABS 1.1 (H) 06/17/2018 1806   EOSABS 0.5 06/17/2018 1806   BASOSABS 0.1 06/17/2018 1806   HEPATIC Function Panel Recent Labs    01/23/18 0410 01/24/18 0632 06/17/18 1806  PROT 6.9 6.5 7.9   HEMOGLOBIN A1C No components found for: HGA1C,  MPG CARDIAC ENZYMES Lab Results  Component Value Date   CKTOTAL 87 04/13/2010   CKMB 1.0 04/13/2010   TROPONINI 0.03 (HH) 06/18/2018   TROPONINI 0.03 ([place]) 06/18/2018   TROPONINI  0.03 (HH) 06/18/2018   BNP No results for input(s): PROBNP in the last 8760 hours. TSH Recent Labs    06/17/18 1806  TSH 1.825   CHOLESTEROL No results for input(s): CHOL in the last 8760 hours.  Scheduled Meds: . albuterol  2.5 mg Nebulization QID  . allopurinol  50 mg Oral Daily  . ALPRAZolam  0.25 mg Oral BID  . aspirin EC  81 mg Oral Daily  . atorvastatin  40 mg Oral Daily  . Chlorhexidine Gluconate Cloth  6 each Topical Q0600  . Chlorhexidine Gluconate Cloth  6 each Topical Q0600  . cinacalcet  90 mg Oral Q breakfast  . doxercalciferol  3 mcg Intravenous Q T,Th,Sa-HD  . heparin  5,000 Units Subcutaneous Q8H  . hydrALAZINE  25 mg Oral BID  . insulin aspart  0-9 Units Subcutaneous TID WC  . midodrine  10 mg Oral Q T,Th,Sa-HD  . multivitamin with  minerals  1 tablet Oral Daily  . [START ON 06/22/2018] predniSONE  20 mg Oral QAC breakfast  . sevelamer carbonate  3,200 mg Oral TID WC  . sodium chloride flush  3 mL Intravenous Q12H  . umeclidinium bromide  1 puff Inhalation Daily   Continuous Infusions: . sodium chloride     PRN Meds:.sodium chloride, sevelamer carbonate, sodium chloride flush  Assessment/Plan: Acute diastolic left heart failure, HFpEF ESRD COPD exacerbation Hypertension Anemia of chronic disease OSA with CPAP Mild MV stenosis Mild AV stenosis  Awaiting additional dialysis/ultrafiltration Continue medical treatment. Patient agreeable to rehab.   LOS: 4 days    Dixie Dials  MD  06/21/2018, 10:40 AM

## 2018-06-21 NOTE — Evaluation (Signed)
Physical Therapy Evaluation Patient Details Name: [name] MRN: [id number] DOB: Jun 23, 1945 Today's Date: 06/21/2018   History of Present Illness  73 years old female has shortness of breath with cough but without fever for about 1 month. She has PMH of diastolic heart failure, anemia of chronic disease, ESRD with T-TH-Sat dialysis, right eye blind, hypertension, obesity, gout, OSA with CPAP and type 2 DM. Admitted for treatment of pulmonary edema.   Clinical Impression  PTA pt living alone in single story home with 3 steps to enter, with limited assist from family for grocery shopping. Pt expresses that her increased SoB and associated fatigue is limiting her from being able to perform her iADLs. Pt reports not sleeping on nights before dialysis, getting up before 2 am to be ready for pick up at 5 am. Pt requires frequent breaks in mobility and performing ADLs due to SoB. Due to pt's decreased ability for mobility she has become deconditioned over the last few months. Pt is min guard for transfers and requires min A for ambulation of 50 feet with 4/4 DoE by end of ambulation. Pt ambulates too quickly for conditions with decreased stability due to fear of not being able to get to a place to safely sit to regain her breath. PT recommends SNF level care for increasing strength and balance as well as for education in energy conservation. PT will continue to follow acutely.     Follow Up Recommendations SNF    Equipment Recommendations  None recommended by PT    Recommendations for Other Services OT consult     Precautions / Restrictions Precautions Precautions: None Restrictions Weight Bearing Restrictions: No      Mobility  Bed Mobility               General bed mobility comments: OOB in recliner on entry   Transfers Overall transfer level: Needs assistance Equipment used: Rolling walker (2 wheeled) Transfers: Sit to/from Stand Sit to Stand: Min guard         General  transfer comment: min guard for safety, good power up and steadying  Ambulation/Gait Ambulation/Gait assistance: Min assist Gait Distance (Feet): 50 Feet Assistive device: Rolling walker (2 wheeled) Gait Pattern/deviations: Step-through pattern;Trunk flexed Gait velocity: too fast for conditions Gait velocity interpretation: >2.62 ft/sec, indicative of community ambulatory General Gait Details: minA for steadying, pt ambulates with increased pace and associated instability, no overt LoB. Pt educated on need for slower, steadier gait however pt expresses fear of not being able to reach her destination due to 4/4 DoE        Balance Overall balance assessment: Needs assistance Sitting-balance support: Feet supported;No upper extremity supported Sitting balance-Leahy Scale: Good     Standing balance support: No upper extremity supported Standing balance-Leahy Scale: Fair Standing balance comment: able to statically stand without assist requires UE support for dynamic activity                             Pertinent Vitals/Pain Pain Assessment: No/denies pain    Home Living Family/patient expects to be discharged to:: Private residence Living Arrangements: Alone Available Help at Discharge: Family;Available PRN/intermittently Type of Home: House Home Access: Stairs to enter   Entrance Stairs-Number of Steps: 3 Home Layout: One level Home Equipment: Walker - 2 wheels;Walker - 4 wheels;Cane - single point      Prior Function Level of Independence: Independent with assistive device(s)  Comments: used cane most of time and rollator at times        Extremity/Trunk Assessment   Upper Extremity Assessment Upper Extremity Assessment: Generalized weakness    Lower Extremity Assessment Lower Extremity Assessment: Generalized weakness       Communication   Communication: Expressive difficulties(difficult due to increased work of breathing)  Cognition  Arousal/Alertness: Awake/alert Behavior During Therapy: WFL for tasks assessed/performed Overall Cognitive Status: Within Functional Limits for tasks assessed                                        General Comments General comments (skin integrity, edema, etc.): Pt with increased work of breathing in static sitting, gasping occasionally with talking. With ambulation pt experiences 4/4 DoE despite SaO2 on RA of 92%O2, max HR with ambulation 112bpm        Assessment/Plan    PT Assessment Patient needs continued PT services  PT Problem List Cardiopulmonary status limiting activity;Decreased strength;Decreased balance;Decreased mobility       PT Treatment Interventions DME instruction;Gait training;Functional mobility training;Therapeutic activities;Therapeutic exercise;Balance training;Cognitive remediation;Patient/family education    PT Goals (Current goals can be found in the Care Plan section)  Acute Rehab PT Goals Patient Stated Goal: be able to be herself again PT Goal Formulation: With patient Time For Goal Achievement: 07/05/18 Potential to Achieve Goals: Fair    Frequency Min 2X/week    AM-PAC PT "6 Clicks" Mobility  Outcome Measure Help needed turning from your back to your side while in a flat bed without using bedrails?: None Help needed moving from lying on your back to sitting on the side of a flat bed without using bedrails?: None Help needed moving to and from a bed to a chair (including a wheelchair)?: None Help needed standing up from a chair using your arms (e.g., wheelchair or bedside chair)?: None Help needed to walk in hospital room?: A Little Help needed climbing 3-5 steps with a railing? : A Lot 6 Click Score: 21    End of Session Equipment Utilized During Treatment: Gait belt Activity Tolerance: Treatment limited secondary to medical complications (Comment)(SoB) Patient left: in chair;with call bell/phone within reach Nurse  Communication: Mobility status PT Visit Diagnosis: Other abnormalities of gait and mobility (R26.89);Muscle weakness (generalized) (M62.81);Difficulty in walking, not elsewhere classified (R26.2)    Time: 2094-7096 PT Time Calculation (min) (ACUTE ONLY): 29 min   Charges:   PT Evaluation $PT Eval Moderate Complexity: 1 Mod PT Treatments $Gait Training: 8-22 mins        Aviance Cooperwood B. Migdalia Dk PT, DPT Acute Rehabilitation Services Pager [phone] Office [phone]   [place] 06/21/2018, 10:01 AM

## 2018-06-21 NOTE — Care Management Important Message (Signed)
Important Message  Patient Details  Name: [name] MRN: [id number] Date of Birth: 02/24/1945   Medicare Important Message Given:  Yes    Orbie Pyo 06/21/2018, 3:29 PM

## 2018-06-21 NOTE — Progress Notes (Signed)
Patient ID: Myrtis Ser, female   DOB: May 02, 1945, 73 y.o.   MRN: [id number]  [facility] Progress Note   Assessment/ Plan:   1.  Dyspnea/pulmonary edema: Attempting ongoing ultrafiltration with hemodialysis-still remains dyspneic with some exertion and I will attempt dialysis today and then again tomorrow with efforts to try and lower her dry weight further.  We discussed the possibility of cramps with this.  She appears to have a prominent component of deconditioning and may need outpatient physical therapy versus SNF.  Reeducated about fluid restriction. 2. ESRD: Usually on a TTS dialysis schedule, plan for additional hemodialysis today for volume unloading. 3. Anemia: Continue ESA with hemodialysis per outpatient schedule. 4. CKD-MBD: Calcium and phosphorus level currently acceptable, monitor on renal diet/Renvela.  On Hectorol and Sensipar for PTH suppression. 5. Nutrition: Continue renal diet with ONS. 6. Hypertension: Continue to monitor on midodrine for hypotension with efforts at ultrafiltration.  Subjective:   Complains of shortness of breath this morning with minimal activity.   Objective:   BP 124/61   Pulse 72   Temp (!) 97.4 F (36.3 C) (Oral)   Resp (!) 24   Ht 5\' 5"  (1.651 m)   Wt 119.5 kg   SpO2 93%   BMI 43.85 kg/m   Physical Exam: Gen: Uncomfortably sitting up in recliner, unable to finish complete sentences. CVS: Pulse regular rhythm, normal rate, normal S1 and S2 Resp: Fine rales bilaterally, no rhonchi/wheeze Abd: Soft, obese, nontender Ext: Trace lower extremity edema, left upper arm AVF  Labs: BMET Recent Labs  Lab 06/17/18 1806 06/18/18 0336 06/19/18 1414  NA 136 136 134*  K 3.7 4.0 4.2  CL 90* 92* 94*  CO2 33* 32 28  GLUCOSE 90 116* 87  BUN 11 19 24*  CREATININE 4.48* 5.39* 6.19*  CALCIUM 9.2 9.1 9.6  PHOS  --   --  2.8   CBC Recent Labs  Lab 06/17/18 1806 06/18/18 0336 06/19/18 1414  WBC 7.5 6.5 7.8  NEUTROABS 4.0   --   --   HGB 8.9* 8.5* 8.9*  HCT 28.5* 27.3* 28.4*  MCV 88.8 89.5 89.6  PLT 229 214 243   Medications:    . albuterol  2.5 mg Nebulization QID  . allopurinol  50 mg Oral Daily  . ALPRAZolam  0.25 mg Oral BID  . aspirin EC  81 mg Oral Daily  . atorvastatin  40 mg Oral Daily  . Chlorhexidine Gluconate Cloth  6 each Topical Q0600  . Chlorhexidine Gluconate Cloth  6 each Topical Q0600  . cinacalcet  90 mg Oral Q breakfast  . doxercalciferol  3 mcg Intravenous Q T,Th,Sa-HD  . heparin  5,000 Units Subcutaneous Q8H  . hydrALAZINE  25 mg Oral BID  . insulin aspart  0-9 Units Subcutaneous TID WC  . midodrine  10 mg Oral Q T,Th,Sa-HD  . multivitamin with minerals  1 tablet Oral Daily  . sevelamer carbonate  3,200 mg Oral TID WC  . sodium chloride flush  3 mL Intravenous Q12H  . umeclidinium bromide  1 puff Inhalation Daily   Elmarie Shiley, MD 06/21/2018, 9:32 AM

## 2018-06-22 LAB — GLUCOSE, CAPILLARY
Glucose-Capillary: 93 mg/dL (ref 70–99)
Glucose-Capillary: 131 mg/dL — ABNORMAL HIGH (ref 70–99)
Glucose-Capillary: 119 mg/dL — ABNORMAL HIGH (ref 70–99)
Glucose-Capillary: 198 mg/dL — ABNORMAL HIGH (ref 70–99)
Glucose-Capillary: 223 mg/dL — ABNORMAL HIGH (ref 70–99)

## 2018-06-22 LAB — CBC
HCT: 28.3 % — ABNORMAL LOW (ref 36.0–46.0)
Hemoglobin: 8.9 g/dL — ABNORMAL LOW (ref 12.0–15.0)
MCH: 28.2 pg (ref 26.0–34.0)
MCHC: 31.4 g/dL (ref 30.0–36.0)
MCV: 89.6 fL (ref 80.0–100.0)
Platelets: 293 10*3/uL (ref 150–400)
RBC: 3.16 MIL/uL — ABNORMAL LOW (ref 3.87–5.11)
RDW: 16.9 % — ABNORMAL HIGH (ref 11.5–15.5)
WBC: 10 10*3/uL (ref 4.0–10.5)
nRBC: 0.9 % — ABNORMAL HIGH (ref 0.0–0.2)

## 2018-06-22 LAB — RENAL FUNCTION PANEL
Albumin: 3 g/dL — ABNORMAL LOW (ref 3.5–5.0)
Anion gap: 14 (ref 5–15)
BUN: 42 mg/dL — ABNORMAL HIGH (ref 8–23)
CO2: 26 mmol/L (ref 22–32)
Calcium: 9.2 mg/dL (ref 8.9–10.3)
Chloride: 93 mmol/L — ABNORMAL LOW (ref 98–111)
Creatinine, Ser: 6.94 mg/dL — ABNORMAL HIGH (ref 0.44–1.00)
GFR calc Af Amer: 6 mL/min — ABNORMAL LOW (ref >60)
GFR calc non Af Amer: 5 mL/min — ABNORMAL LOW (ref >60)
Glucose, Bld: 117 mg/dL — ABNORMAL HIGH (ref 70–99)
Phosphorus: 3 mg/dL (ref 2.5–4.6)
Potassium: 4.4 mmol/L (ref 3.5–5.1)
Sodium: 133 mmol/L — ABNORMAL LOW (ref 135–145)

## 2018-06-22 MED ORDER — DOXERCALCIFEROL 4 MCG/2ML IV SOLN
INTRAVENOUS | Status: AC
  Filled 2018-06-22: qty 2

## 2018-06-22 MED ORDER — ADULT MULTIVITAMIN W/MINERALS CH: 1.0000 | ORAL_TABLET | Freq: Every day | ORAL | Status: DC

## 2018-06-22 MED ORDER — INSULIN ASPART 100 UNIT/ML ~~LOC~~ SOLN: 0.0000 [IU] | Freq: Three times a day (TID) | SUBCUTANEOUS | 11 refills | Status: DC

## 2018-06-22 MED ORDER — DOXERCALCIFEROL 4 MCG/2ML IV SOLN: 3.0000 ug | INTRAVENOUS | Status: AC

## 2018-06-22 MED ORDER — ALBUTEROL SULFATE (2.5 MG/3ML) 0.083% IN NEBU: 2.5000 mg | INHALATION_SOLUTION | RESPIRATORY_TRACT | 0 refills | Status: AC | PRN

## 2018-06-22 MED ORDER — HEPARIN SODIUM (PORCINE) 1000 UNIT/ML IJ SOLN
INTRAMUSCULAR | Status: AC
  Administered 2018-06-22: 1000 [IU]
  Filled 2018-06-22: qty 10

## 2018-06-22 MED ORDER — PREDNISONE 10 MG PO TABS: 10.0000 mg | ORAL_TABLET | Freq: Every day | ORAL | 0 refills | Status: AC

## 2018-06-22 MED ORDER — CINACALCET HCL 30 MG PO TABS: 90.0000 mg | ORAL_TABLET | Freq: Every day | ORAL | 3 refills | Status: DC

## 2018-06-22 MED ORDER — ALPRAZOLAM 0.25 MG PO TABS: 0.2500 mg | ORAL_TABLET | Freq: Every day | ORAL | 2 refills | Status: DC

## 2018-06-22 MED ORDER — ALLOPURINOL 100 MG PO TABS: 50.0000 mg | ORAL_TABLET | Freq: Every day | ORAL | 3 refills | Status: AC

## 2018-06-22 MED ORDER — SEVELAMER CARBONATE 800 MG PO TABS: 3200.0000 mg | ORAL_TABLET | Freq: Three times a day (TID) | ORAL | 3 refills | Status: AC

## 2018-06-22 MED ORDER — HYDRALAZINE HCL 25 MG PO TABS: 25.0000 mg | ORAL_TABLET | Freq: Two times a day (BID) | ORAL | 3 refills | Status: DC

## 2018-06-22 NOTE — Progress Notes (Signed)
Pt not ready for CPAP yet. Pt to notify for RT when she is ready for bed.

## 2018-06-22 NOTE — Procedures (Signed)
Patient seen on Hemodialysis. BP (!) 142/68 (BP Location: Right Arm)   Pulse 76   Temp 97.8 F (36.6 C) (Oral)   Resp 17   Ht 5\' 5"  (1.651 m)   Wt 122 kg   SpO2 99%   BMI 44.76 kg/m   QB 400, UF goal 1.5L Tolerating treatment without complaints at this time.   Elmarie Shiley MD [facility]. Office # [phone] Pager # [phone] 1:38 PM

## 2018-06-22 NOTE — Progress Notes (Signed)
Patient ID: [name], female   DOB: [birth year], 73 y.o.   MRN: [id number]  [facility] Progress Note   Assessment/ Plan:   1.  Dyspnea/pulmonary edema: Improved somewhat overnight with hemodialysis and attempts to aggressively lower her dry weight.  Regular hemodialysis again today.  Plans noted for discharge to skilled nursing facility when stable. 2. ESRD: Usually on a TTS dialysis schedule with additional hemodialysis done yesterday-we will resume her usual outpatient schedule today; discharge with new dry weight is done postdialysis assessment today (likely 119.5kg). 3. Anemia: Continue ESA with hemodialysis per outpatient schedule. 4. CKD-MBD: Calcium and phosphorus level currently acceptable, monitor on renal diet/Renvela.  On Hectorol and Sensipar for PTH suppression. 5. Nutrition: Continue renal diet with ONS. 6. Hypertension: Continue to monitor on midodrine for hypotension with efforts at ultrafiltration.  Subjective:   Reports improvement of shortness of breath after dialysis but cramping overnight in her hands and legs.   Objective:   BP (!) 107/52   Pulse 78   Temp 98 F (36.7 C) (Oral)   Resp 20   Ht 5\' 5"  (1.651 m)   Wt 119.3 kg   SpO2 99%   BMI 43.77 kg/m   Physical Exam: Gen: Resting comfortably in recliner. CVS: Pulse regular rhythm, normal rate, normal S1 and S2 Resp: Coarse/transmitted breath sounds bilaterally without distinct rales or rhonchi Abd: Soft, obese, nontender Ext: Trace lower extremity edema, left upper arm AVF with palpable thrill  Labs: BMET Recent Labs  Lab 06/17/18 1806 06/18/18 0336 06/19/18 1414  NA 136 136 134*  K 3.7 4.0 4.2  CL 90* 92* 94*  CO2 33* 32 28  GLUCOSE 90 116* 87  BUN 11 19 24*  CREATININE 4.48* 5.39* 6.19*  CALCIUM 9.2 9.1 9.6  PHOS  --   --  2.8   CBC Recent Labs  Lab 06/17/18 1806 06/18/18 0336 06/19/18 1414  WBC 7.5 6.5 7.8  NEUTROABS 4.0  --   --   HGB 8.9* 8.5* 8.9*  HCT 28.5*  27.3* 28.4*  MCV 88.8 89.5 89.6  PLT 229 214 243   Medications:    . albuterol  2.5 mg Nebulization QID  . allopurinol  50 mg Oral Daily  . ALPRAZolam  0.25 mg Oral BID  . aspirin EC  81 mg Oral Daily  . atorvastatin  40 mg Oral Daily  . Chlorhexidine Gluconate Cloth  6 each Topical Q0600  . Chlorhexidine Gluconate Cloth  6 each Topical Q0600  . cinacalcet  90 mg Oral Q breakfast  . doxercalciferol  3 mcg Intravenous Q T,Th,Sa-HD  . heparin  5,000 Units Subcutaneous Q8H  . hydrALAZINE  25 mg Oral BID  . insulin aspart  0-9 Units Subcutaneous TID WC  . midodrine  10 mg Oral Q T,Th,Sa-HD  . multivitamin with minerals  1 tablet Oral Daily  . predniSONE  20 mg Oral QAC breakfast  . sevelamer carbonate  3,200 mg Oral TID WC  . sodium chloride flush  3 mL Intravenous Q12H  . umeclidinium bromide  1 puff Inhalation Daily   Elmarie Shiley, MD 06/22/2018, 8:18 AM

## 2018-06-22 NOTE — TOC Progression Note (Addendum)
Transition of Care [facility]) - Progression Note    Patient Details  Name: CELLA CAPPELLO MRN: [id number] Date of Birth: 11/08/1945  Transition [facility]) CM/SW [place], LCSW Phone Number: 06/22/2018, 3:04 PM  Clinical Narrative:   Pt has been accepted at [facility] and pt is agreeable. Pt will continue to use SCAT to HD from SNF- pt aware. Pt will need updated COVID-19 test. MD aware.  Facility determined pt does not have Medicare A&B but rather has [facility], therefore will need prior auth to admit to SNF. Facility has started that authorization.       Expected Discharge Plan: Skilled Nursing Facility Barriers to Discharge: Continued Medical Work up  Expected Discharge Plan and Services Expected Discharge Plan: [place] In-house Referral: NA Discharge Planning Services: CM Consult Post Acute Care Choice: [place] Living arrangements for the past 2 months: Single Family Home Expected Discharge Date: 06/22/18               DME Arranged: N/A DME Agency: NA       HH Arranged: NA HH Agency: NA         Social Determinants of Health (SDOH) Interventions    Readmission Risk Interventions Readmission Risk Prevention Plan 06/22/2018 06/21/2018  Transportation Screening - Complete  PCP or Specialist Appt within 3-5 Days Complete -  HRI or [place] - Complete  Social Work Consult for [place] Planning/Counseling - Complete  Palliative Care Screening Not Applicable -  Medication Review Press photographer) - Complete  Some recent data might be hidden

## 2018-06-22 NOTE — Discharge Summary (Signed)
Physician Discharge Summary  Patient ID: [name] MRN: [id number] DOB/AGE: [birth year] 74 y.o.  Admit date: 06/17/2018 Discharge date: 06/22/2018  Admission Diagnoses: Shortness of breath H/O diastolic heart failure R/O pneumonia ESRD Obesity Gout Hypertension Anemia of chronic disease OSA with CPAP use.  Discharge Diagnoses:  Principal Problem:  Acute diastolic left heart failure, HFpEF Active Problems:   End stage renal disease (HCC)   Blind right eye   Obesity   Gout   Hypertension   COPD, exacerbation   OSA with CPAP use   Anemia of chronic disease   Generalized anxiety  Discharged Condition: fair  Hospital Course: 73 years old female had shortness of breath with cough without fever x 1 month. She has PMH of diastolic heart failure, anemia of chronic disease, obesity, hypertension, gout, OSA and ESRD. She needed several dialysis treatments to drop her dry weight by 10 pounds and some improvement in episodes of shortness of breath. Xanax was added for anxiety.  She was sent to area SNF for rehab.   Consults: cardiology and nephrology.  Significant Diagnostic Studies: labs: Near normal electrolytes, BUN of 11 to 42 and creatinine of 4.48 to 6.94. Albumin was somewhat low at 3.0 g/dL. CBC showed chronic low Hgb of 8.9. Normal WBC and platelets count. Troponin I was minimally elevated at 0.03 ng. Hgb A1C was 6.2 %. BNP was 509.5 pg.  EKG: SR with poor r wave progression.  CXR: showed cardiomegaly and interstitial edema. improved on follow up CXR in 3 days.  Echocardiogram showed normal LV systolic function, moderate LVH and mild diastolic dysfunction  Treatments: dialysis: Hemodialysis.   Discharge Exam: Blood pressure (!) 142/68, pulse 76, temperature 97.8 F (36.6 C), temperature source Oral, resp. rate 17, height 5\' 5"  (1.651 m), weight 122 kg, SpO2 99 %. General appearance: alert, cooperative and appears stated age. Head: Normocephalic, atraumatic. Eyes:  Brown eyes, pale conjunctiva, corneas clear. Right eye blind.  Neck: No adenopathy, no carotid bruit, no JVD, supple, symmetrical, trachea midline and thyroid not enlarged. Resp: Clearing to auscultation bilaterally. Cardio: Regular rate and rhythm, S1, S2 normal, II/VI systolic murmur, no click, rub or gallop. GI: Soft, non-tender; bowel sounds normal; no organomegaly. Extremities: No edema, cyanosis or clubbing. Skin: Warm and dry.  Neurologic: Alert and oriented X 3, normal strength and tone. Normal coordination and slowgait.  Disposition: Discharge disposition: 03-Skilled Nursing Facility        Allergies as of 06/22/2018      Reactions   Lyrica [pregabalin] Other (See Comments)   Hallucinations      Medication List    STOP taking these medications   ethyl chloride spray     TAKE these medications   albuterol (2.5 MG/3ML) 0.083% nebulizer solution Commonly known as:  PROVENTIL Take 3 mLs (2.5 mg total) by nebulization every 4 (four) hours as needed for shortness of breath. What changed:  when to take this   allopurinol 100 MG tablet Commonly known as:  ZYLOPRIM Take 0.5 tablets (50 mg total) by mouth daily. Start taking on:  June 23, 2018   ALPRAZolam 0.25 MG tablet Commonly known as:  XANAX Take 1 tablet (0.25 mg total) by mouth at bedtime.   aspirin EC 81 MG tablet Take 81 mg by mouth daily.   atorvastatin 40 MG tablet Commonly known as:  LIPITOR Take 40 mg by mouth daily.   budesonide-formoterol 160-4.5 MCG/ACT inhaler Commonly known as:  Symbicort Inhale 2 puffs into the lungs 2 (two) times daily.  cinacalcet 30 MG tablet Commonly known as:  SENSIPAR Take 3 tablets (90 mg total) by mouth daily with breakfast. Start taking on:  June 23, 2018 What changed:    medication strength  how much to take  when to take this   colchicine 0.6 MG tablet Take 0.6 mg by mouth daily as needed (at onset of gout flare).   doxercalciferol 4 MCG/2ML  injection Commonly known as:  HECTOROL Inject 1.5 mLs (3 mcg total) into the vein Every Tuesday,Thursday,and Saturday with dialysis.   hydrALAZINE 25 MG tablet Commonly known as:  APRESOLINE Take 1 tablet (25 mg total) by mouth 2 (two) times daily.   insulin aspart 100 UNIT/ML injection Commonly known as:  novoLOG Inject 0-9 Units into the skin 3 (three) times daily with meals.   midodrine 10 MG tablet Commonly known as:  PROAMATINE Take 10-20 mg by mouth See admin instructions. Take 1 tablet by mouth as directed with meals Take 1 tablet by mouth just before dialysis and another 1 tablet in the middle of dialysis   multivitamin with minerals Tabs tablet Take 1 tablet by mouth daily.   predniSONE 10 MG tablet Commonly known as:  DELTASONE Take 1 tablet (10 mg total) by mouth daily before breakfast for 5 days. Start taking on:  June 23, 2018   sevelamer carbonate 800 MG tablet Commonly known as:  RENVELA Take 4 tablets (3,200 mg total) by mouth 3 (three) times daily with meals. What changed:    how much to take  when to take this  additional instructions   Tiotropium Bromide Monohydrate 2.5 MCG/ACT Aers Commonly known as:  Spiriva Respimat Inhale 2 puffs into the lungs daily.       Contact information for follow-up providers    Dixie Dials, MD. Schedule an appointment as soon as possible for a visit in 1 week(s).   Specialty:  Cardiology Contact information: [place] [address] [phone]        [name], MD. Schedule an appointment as soon as possible for a visit in 1 month(s).   Specialty:  Internal Medicine Contact information: [address] [place] [address] [phone]            Contact information for after-discharge care    Destination    [facility] .   Service:  Skilled Nursing Contact information: [address] [place] [phone]                   Signed: Birdie Riddle 06/22/2018, 2:06 PM

## 2018-06-22 NOTE — TOC Progression Note (Addendum)
Transition of Care [facility]) - Progression Note    Patient Details  Name: [name] MRN: [id number] Date of Birth: 1945-08-07  Transition of Care [facility]) CM/SW Contact  Eileen Stanford, LCSW Phone Number: 06/22/2018, 11:27 AM  Clinical Narrative:   Helene Kelp unable to talk pt due to HD. [facility] is full. CSW to ask pt for alternative options.     Expected Discharge Plan: Skilled Nursing Facility Barriers to Discharge: Continued Medical Work up  Expected Discharge Plan and Services Expected Discharge Plan: [place] In-house Referral: NA Discharge Planning Services: CM Consult Post Acute Care Choice: [place] Living arrangements for the past 2 months: Single Family Home                 DME Arranged: N/A DME Agency: NA       HH Arranged: NA HH Agency: NA         Social Determinants of Health (SDOH) Interventions    Readmission Risk Interventions Readmission Risk Prevention Plan 06/22/2018 06/21/2018  Transportation Screening - Complete  PCP or Specialist Appt within 3-5 Days Complete -  HRI or [place] - Complete  Social Work Consult for [place] Planning/Counseling - Complete  Palliative Care Screening Not Applicable -  Medication Review Press photographer) - Complete  Some recent data might be hidden

## 2018-06-22 NOTE — Evaluation (Signed)
Occupational Therapy Evaluation Patient Details Name: [name] MRN: [id number] DOB: 09-01-1945 Today's Date: 06/22/2018    History of Present Illness 73 years old female has shortness of breath with cough but without fever for about 1 month. She has PMH of diastolic heart failure, anemia of chronic disease, ESRD with T-TH-Sat dialysis, right eye blind, hypertension, obesity, gout, OSA with CPAP and type 2 DM. Admitted for treatment of pulmonary edema.    Clinical Impression   Pt admitted with the above diagnoses and presents with below problem list. Pt will benefit from continued acute OT to address the below listed deficits and maximize independence with basic ADLs prior to d/c home. PTA pt was mod I with ADLs, reports she struggles with physical aspect of IADLs. Pt is currently min guard with LB ADLs. DOE 4/4 during household distance functional mobility. O2 after ambulation once seated in chair was 100. Pt on RA throughout session. Pt noted to ambulate with increased speed and declined standing rest breaks "because of breathing" "I just have to hurry up and get to where I'm going." Pt is from home alone. Recommending [facility] SNF rehab prior to pt returning home.      Follow Up Recommendations  SNF    Equipment Recommendations  Other (comment)(defer to next venue)    Recommendations for Other Services       Precautions / Restrictions Restrictions Weight Bearing Restrictions: No      Mobility Bed Mobility               General bed mobility comments: OOB in recliner on entry   Transfers Overall transfer level: Needs assistance Equipment used: Rolling walker (2 wheeled) Transfers: Sit to/from Stand Sit to Stand: Min guard         General transfer comment: min guard for safety, good power up and steadying. Quick to initiate walking.    Balance Overall balance assessment: Needs assistance Sitting-balance support: Feet supported;No upper extremity supported Sitting  balance-Leahy Scale: Good     Standing balance support: No upper extremity supported Standing balance-Leahy Scale: Fair                             ADL either performed or assessed with clinical judgement   ADL Overall ADL's : Needs assistance/impaired Eating/Feeding: Set up;Sitting   Grooming: Set up;Sitting   Upper Body Bathing: Set up;Sitting   Lower Body Bathing: Min guard;Sit to/from stand   Upper Body Dressing : Set up;Sitting   Lower Body Dressing: Min guard;Sit to/from stand   Toilet Transfer: Min guard;Ambulation;RW;Comfort height toilet;Grab bars   Toileting- Clothing Manipulation and Hygiene: Min guard;Set up;Sitting/lateral lean;Sit to/from stand   Tub/ Shower Transfer: Walk-in shower;Min guard;Ambulation;Rolling walker;3 in 1   Functional mobility during ADLs: Min guard;Rolling walker General ADL Comments: Pt completed household distance functional mobility at increased speed ("because of my beathing"). Introduced Pharmacist, hospital.     Vision Baseline Vision/History: Legally blind(R eye)       Perception     Praxis      Pertinent Vitals/Pain Pain Assessment: No/denies pain     Hand Dominance Right   Extremity/Trunk Assessment Upper Extremity Assessment Upper Extremity Assessment: Generalized weakness   Lower Extremity Assessment Lower Extremity Assessment: Generalized weakness       Communication Communication Communication: Expressive difficulties(difficult due to increased work of breathing)   Cognition Arousal/Alertness: Awake/alert Behavior During Therapy: WFL for tasks assessed/performed Overall Cognitive Status: Within Functional  Limits for tasks assessed                                     General Comments  Pt with increased work of breathing in static sitting, gasping occasionally with talking. With ambulation pt experiences 4/4 DoE. O2 after ambulation on RA was 100. Increased spped with  walking despite DOE. Declined standing rest breaks "I just have to get where I'm going."    Exercises     Shoulder Instructions      Home Living Family/patient expects to be discharged to:: Private residence Living Arrangements: Alone Available Help at Discharge: Family;Available PRN/intermittently Type of Home: House Home Access: Stairs to enter [employer] of Steps: 3 Entrance Stairs-Rails: Right Home Layout: One level     Bathroom Shower/Tub: Occupational psychologist: Standard Bathroom Accessibility: Yes   Home Equipment: Environmental consultant - 2 wheels;Walker - 4 wheels;Cane - single point          Prior Functioning/Environment Level of Independence: Independent with assistive device(s)        Comments: used cane most of time and rollator at times; reports she struggles with physical aspect IADLs         OT Problem List: Decreased strength;Impaired balance (sitting and/or standing);Decreased knowledge of use of DME or AE;Decreased knowledge of precautions;Cardiopulmonary status limiting activity;Decreased activity tolerance      OT Treatment/Interventions: Self-care/ADL training;Therapeutic exercise;Energy conservation;DME and/or AE instruction;Therapeutic activities;Patient/family education;Balance training    OT Goals(Current goals can be found in the care plan section) Acute Rehab OT Goals Patient Stated Goal: be able to be herself again OT Goal Formulation: With patient Time For Goal Achievement: 07/06/18 Potential to Achieve Goals: Good ADL Goals Pt Will Perform Grooming: with modified independence;sitting Pt Will Perform Upper Body Bathing: with modified independence;sitting Pt Will Perform Lower Body Bathing: with modified independence;sit to/from stand Pt Will Perform Upper Body Dressing: with modified independence;sitting Pt Will Perform Lower Body Dressing: with modified independence;sit to/from stand Pt Will Transfer to Toilet: with modified  independence;ambulating Pt Will Perform Toileting - Clothing Manipulation and hygiene: with modified independence;sit to/from stand;sitting/lateral leans Pt Will Perform Tub/Shower Transfer: Shower transfer;with modified independence;ambulating;rolling walker;3 in 1  OT Frequency: Min 2X/week   Barriers to D/C:            Co-evaluation              AM-PAC OT "6 Clicks" Daily Activity     Outcome Measure Help from another person eating meals?: None Help from another person taking care of personal grooming?: None Help from another person toileting, which includes using toliet, bedpan, or urinal?: None Help from another person bathing (including washing, rinsing, drying)?: A Little Help from another person to put on and taking off regular upper body clothing?: None Help from another person to put on and taking off regular lower body clothing?: None 6 Click Score: 23   End of Session Equipment Utilized During Treatment: Rolling walker  Activity Tolerance: Other (comment)(DOE 4/4) Patient left: in chair;with call bell/phone within reach  OT Visit Diagnosis: Unsteadiness on feet (R26.81);Muscle weakness (generalized) (M62.81)                Time: 3382-5053 OT Time Calculation (min): 20 min Charges:  OT General Charges $OT Visit: 1 Visit OT Evaluation $OT Eval Low Complexity: [place], OT Acute Rehabilitation Services Pager: [phone] Office: [phone]  Hortencia Pilar 06/22/2018, 9:38 AM

## 2018-06-23 LAB — SARS CORONAVIRUS 2: SARS Coronavirus 2: NOT DETECTED

## 2018-06-23 LAB — GLUCOSE, CAPILLARY
Glucose-Capillary: 85 mg/dL (ref 70–99)
Glucose-Capillary: 117 mg/dL — ABNORMAL HIGH (ref 70–99)
Glucose-Capillary: 211 mg/dL — ABNORMAL HIGH (ref 70–99)
Glucose-Capillary: 228 mg/dL — ABNORMAL HIGH (ref 70–99)

## 2018-06-23 NOTE — TOC Progression Note (Signed)
Transition of Care [facility]) - Progression Note    Patient Details  Name: [name] MRN: [id number] Date of Birth: 29-Sep-1945  Transition [facility] Care [facility]) CM/SW Contact  Eileen Stanford, LCSW Phone Number: 06/23/2018, 3:35 PM  Clinical Narrative:   Pt has completed admission paperwork for [facility], paperwork was faxed to facility (fax number confirmed). Pt will get HD here tomorrow and then d/c to SNF. Pt will be on first round, CSW confirmed with [place] in HD. Covid-19 test has been completed, awaiting results. Pt aware she will d/c to SNF tomorrow, if still medically stable. Pt prefers her daughter transport her to SNF at d/c. CSW will set up transport if needed.    Expected Discharge Plan: Skilled Nursing Facility Barriers to Discharge: Continued Medical Work up  Expected Discharge Plan and Services Expected Discharge Plan: [place] In-house Referral: NA Discharge Planning Services: CM Consult Post Acute Care Choice: [place] Living arrangements for the past 2 months: Single Family Home Expected Discharge Date: 06/22/18               DME Arranged: N/A DME Agency: NA       HH Arranged: NA HH Agency: NA         Social Determinants of Health (SDOH) Interventions    Readmission Risk Interventions Readmission Risk Prevention Plan 06/23/2018 06/22/2018 06/21/2018  Transportation Screening - - Complete  PCP or Specialist Appt within 3-5 Days - Complete -  HRI or [place] - - Complete  Social Work Consult for [place] Planning/Counseling - - Complete  Palliative Care Screening - Not Applicable -  Medication Review Press photographer) - - Complete  PCP or Specialist appointment within 3-5 days of discharge Complete - -  [place] or Home Care Consult Complete - -  SW Recovery Care/Counseling Consult Complete - -  Palliative Care Screening Not Applicable - -  Skilled Nursing Facility Complete - -  Some recent data might be hidden

## 2018-06-23 NOTE — Progress Notes (Signed)
Ref: [name], MD   Subjective:  Awake. VS improved. Minimal respiratory distress at rest.   Objective:  Vital Signs in the last 24 hours: Temp:  [97.7 F (36.5 C)-98.3 F (36.8 C)] 97.7 F (36.5 C) (06/03 0459) Pulse Rate:  [61-84] 67 (06/03 0459) Cardiac Rhythm: Heart block (06/03 0700) Resp:  [14-21] 20 (06/03 0459) BP: (98-142)/(50-71) 105/50 (06/03 0911) SpO2:  [97 %-100 %] 97 % (06/03 0832) Weight:  [120 kg-122 kg] 121 kg (06/03 0501)  Physical Exam: BP Readings from Last 1 Encounters:  06/23/18 (!) 105/50     Wt Readings from Last 1 Encounters:  06/23/18 121 kg    Weight change: -0.4 kg Body mass index is 44.4 kg/m. HEENT: [place]/AT, Eyes-Brown, PERL, EOMI, Conjunctiva-Pale pink, Sclera-Non-icteric Neck: No JVD, No bruit, Trachea midline. Lungs:  Clear, Bilateral. Cardiac:  Regular rhythm, normal S1 and S2, no S3. II/VI systolic murmur. Abdomen:  Soft, non-tender. BS present. Extremities:  No edema present. No cyanosis. No clubbing. CNS: AxOx3, Cranial nerves grossly intact, moves all 4 extremities.  Skin: Warm and dry.   Intake/Output from previous day: 06/02 0701 - 06/03 0700 In: 885 [P.O.:882; I.V.:3] Out: 1500     Lab Results: BMET    Component Value Date/Time   NA 133 (L) 06/22/2018 1300   NA 134 (L) 06/19/2018 1414   NA 136 06/18/2018 0336   K 4.4 06/22/2018 1300   K 4.2 06/19/2018 1414   K 4.0 06/18/2018 0336   CL 93 (L) 06/22/2018 1300   CL 94 (L) 06/19/2018 1414   CL 92 (L) 06/18/2018 0336   CO2 26 06/22/2018 1300   CO2 28 06/19/2018 1414   CO2 32 06/18/2018 0336   GLUCOSE 117 (H) 06/22/2018 1300   GLUCOSE 87 06/19/2018 1414   GLUCOSE 116 (H) 06/18/2018 0336   BUN 42 (H) 06/22/2018 1300   BUN 24 (H) 06/19/2018 1414   BUN 19 06/18/2018 0336   CREATININE 6.94 (H) 06/22/2018 1300   CREATININE 6.19 (H) 06/19/2018 1414   CREATININE 5.39 (H) 06/18/2018 0336   CREATININE 2.86 (H) 10/10/2010 1140   CREATININE 2.20 (H) 06/26/2010 1637   CALCIUM 9.2 06/22/2018 1300   CALCIUM 9.6 06/19/2018 1414   CALCIUM 9.1 06/18/2018 0336   CALCIUM 8.7 01/08/2010 1121   CALCIUM 9.1 10/08/2009 0922   CALCIUM 8.6 07/30/2009 1102   GFRNONAA 5 (L) 06/22/2018 1300   GFRNONAA 6 (L) 06/19/2018 1414   GFRNONAA 7 (L) 06/18/2018 0336   GFRAA 6 (L) 06/22/2018 1300   GFRAA 7 (L) 06/19/2018 1414   GFRAA 8 (L) 06/18/2018 0336   CBC    Component Value Date/Time   WBC 10.0 06/22/2018 1300   RBC 3.16 (L) 06/22/2018 1300   HGB 8.9 (L) 06/22/2018 1300   HCT 28.3 (L) 06/22/2018 1300   PLT 293 06/22/2018 1300   MCV 89.6 06/22/2018 1300   MCH 28.2 06/22/2018 1300   MCHC 31.4 06/22/2018 1300   RDW 16.9 (H) 06/22/2018 1300   LYMPHSABS 1.9 06/17/2018 1806   MONOABS 1.1 (H) 06/17/2018 1806   EOSABS 0.5 06/17/2018 1806   BASOSABS 0.1 06/17/2018 1806   HEPATIC Function Panel Recent Labs    01/23/18 0410 01/24/18 0632 06/17/18 1806  PROT 6.9 6.5 7.9   HEMOGLOBIN A1C No components found for: HGA1C,  MPG CARDIAC ENZYMES Lab Results  Component Value Date   CKTOTAL 87 04/13/2010   CKMB 1.0 04/13/2010   TROPONINI 0.03 (HH) 06/18/2018   TROPONINI 0.03 (HH) 06/18/2018  TROPONINI 0.03 (HH) 06/18/2018   BNP No results for input(s): PROBNP in the last 8760 hours. TSH Recent Labs    06/17/18 1806  TSH 1.825   CHOLESTEROL No results for input(s): CHOL in the last 8760 hours.  Scheduled Meds: . albuterol  2.5 mg Nebulization QID  . allopurinol  50 mg Oral Daily  . ALPRAZolam  0.25 mg Oral BID  . aspirin EC  81 mg Oral Daily  . atorvastatin  40 mg Oral Daily  . Chlorhexidine Gluconate Cloth  6 each Topical Q0600  . Chlorhexidine Gluconate Cloth  6 each Topical Q0600  . cinacalcet  90 mg Oral Q breakfast  . doxercalciferol  3 mcg Intravenous Q T,Th,Sa-HD  . heparin  5,000 Units Subcutaneous Q8H  . hydrALAZINE  25 mg Oral BID  . insulin aspart  0-9 Units Subcutaneous TID WC  . midodrine  10 mg Oral Q T,Th,Sa-HD  . multivitamin with  minerals  1 tablet Oral Daily  . sevelamer carbonate  3,200 mg Oral TID WC  . sodium chloride flush  3 mL Intravenous Q12H  . umeclidinium bromide  1 puff Inhalation Daily   Continuous Infusions: . sodium chloride     PRN Meds:.sodium chloride, sevelamer carbonate, sodium chloride flush  Assessment/Plan: Acute diastolic left heart failure, HFpEF ESRD Bling in right eye Obesity HTN COPD, exacerbation Gout OSA with CPAP use Anemia of chronic disease Generalized anxiety  Continue medical treatment. Home v/s SNF, awaiting insurance approval.    LOS: 6 days    Dixie Dials  MD  06/23/2018, 10:55 AM

## 2018-06-23 NOTE — Progress Notes (Signed)
Pt is not ready for CPAP at this time.  RT will continue to monitor.

## 2018-06-23 NOTE — Progress Notes (Signed)
RT placed pt on CPAP dream station for the night with settings of high 20 low 6 with no FIO2 bled into the system. Pt tolerating well. RT will continue to monitor.

## 2018-06-23 NOTE — Progress Notes (Signed)
Patient ID: Myrtis Ser, female   DOB: 1945/11/08, 73 y.o.   MRN: [id number]  [facility] Progress Note   Assessment/ Plan:   1.  Dyspnea/pulmonary edema: This is continue to improve with aggressive hemodialysis and ultrafiltration to a point that she is now stable for discharge to skilled nursing facility off of oxygen supplementation. 2. ESRD: Usually on a TTS dialysis schedule with hemodialysis done yesterday-plan for discharge to skilled nursing facility possibly today-we will resume dialysis as an outpatient with her new estimated dry weight of 120 kg (postdialysis weight from yesterday). 3. Anemia: Continue ESA with hemodialysis per outpatient schedule. 4. CKD-MBD: Calcium and phosphorus level currently acceptable, monitor on renal diet/Renvela.  On Hectorol and Sensipar for PTH suppression. 5. Nutrition: Continue renal diet with ONS. 6. Hypertension: Continue to monitor on midodrine for hypotension with efforts at ultrafiltration.  Subjective:   Reports that her shortness of breath has resolved and she had very minimal cramping in her hands yesterday night.   Objective:   BP (!) 127/59 (BP Location: Right Arm)   Pulse 67   Temp 97.7 F (36.5 C)   Resp 20   Ht 5\' 5"  (1.651 m)   Wt 121 kg   SpO2 100%   BMI 44.40 kg/m   Physical Exam: Gen: Sitting up comfortably in her recliner. CVS: Pulse regular rhythm, normal rate, normal S1 and S2 Resp: Poor inspiratory effort with decreased breath sounds over bases otherwise no distinct rales or rhonchi Abd: Soft, obese, nontender Ext: Trace ankle extremity edema, left upper arm AVF with palpable thrill  Labs: BMET Recent Labs  Lab 06/17/18 1806 06/18/18 0336 06/19/18 1414 06/22/18 1300  NA 136 136 134* 133*  K 3.7 4.0 4.2 4.4  CL 90* 92* 94* 93*  CO2 33* 32 28 26  GLUCOSE 90 116* 87 117*  BUN 11 19 24* 42*  CREATININE 4.48* 5.39* 6.19* 6.94*  CALCIUM 9.2 9.1 9.6 9.2  PHOS  --   --  2.8 3.0   CBC Recent  Labs  Lab 06/17/18 1806 06/18/18 0336 06/19/18 1414 06/22/18 1300  WBC 7.5 6.5 7.8 10.0  NEUTROABS 4.0  --   --   --   HGB 8.9* 8.5* 8.9* 8.9*  HCT 28.5* 27.3* 28.4* 28.3*  MCV 88.8 89.5 89.6 89.6  PLT 229 214 243 293   Medications:    . albuterol  2.5 mg Nebulization QID  . allopurinol  50 mg Oral Daily  . ALPRAZolam  0.25 mg Oral BID  . aspirin EC  81 mg Oral Daily  . atorvastatin  40 mg Oral Daily  . Chlorhexidine Gluconate Cloth  6 each Topical Q0600  . Chlorhexidine Gluconate Cloth  6 each Topical Q0600  . cinacalcet  90 mg Oral Q breakfast  . doxercalciferol  3 mcg Intravenous Q T,Th,Sa-HD  . heparin  5,000 Units Subcutaneous Q8H  . hydrALAZINE  25 mg Oral BID  . insulin aspart  0-9 Units Subcutaneous TID WC  . midodrine  10 mg Oral Q T,Th,Sa-HD  . multivitamin with minerals  1 tablet Oral Daily  . predniSONE  20 mg Oral QAC breakfast  . sevelamer carbonate  3,200 mg Oral TID WC  . sodium chloride flush  3 mL Intravenous Q12H  . umeclidinium bromide  1 puff Inhalation Daily   Elmarie Shiley, MD 06/23/2018, 8:26 AM

## 2018-06-23 NOTE — Progress Notes (Signed)
Physical Therapy Treatment Patient Details Name: [name] MRN: [id number] DOB: Mar 08, 1945 Today's Date: 06/23/2018    History of Present Illness 73 years old female has shortness of breath with cough but without fever for about 1 month. She has PMH of diastolic heart failure, anemia of chronic disease, ESRD with T-TH-Sat dialysis, right eye blind, hypertension, obesity, gout, OSA with CPAP and type 2 DM. Admitted for treatment of pulmonary edema.     PT Comments    Pt looks much better today sitting up in recliner. Able to carry on conversation without being SoB. Pt is currently min guard for transfers and min A for ambulation of 75 feet with RW. Focus of session on decreased ambulatory speed to conserve energy. After ambulation pt was only experiencing 2/3 DoE. D/c plans remain appropriate at this time. Pt hope to bed d/c'd tomorrow.    Follow Up Recommendations  SNF     Equipment Recommendations  None recommended by PT    Recommendations for Other Services OT consult     Precautions / Restrictions Restrictions Weight Bearing Restrictions: No    Mobility  Bed Mobility               General bed mobility comments: OOB in recliner on entry   Transfers Overall transfer level: Needs assistance Equipment used: Rolling walker (2 wheeled) Transfers: Sit to/from Stand Sit to Stand: Min guard         General transfer comment: min guard for safety, good power up and steadying   Ambulation/Gait Ambulation/Gait assistance: Min assist Gait Distance (Feet): 75 Feet Assistive device: Rolling walker (2 wheeled) Gait Pattern/deviations: Step-through pattern;Trunk flexed Gait velocity: slowed Gait velocity interpretation: 1.31 - 2.62 ft/sec, indicative of limited community ambulator General Gait Details: minA for steadying, focused on decreased gait speed to improve stability and decreased DoE, pt able to reduce speed and experienced only 2/4 DoE and was able to carry on  conversation after returning to room.       Balance Overall balance assessment: Needs assistance Sitting-balance support: Feet supported;No upper extremity supported Sitting balance-Leahy Scale: Good     Standing balance support: No upper extremity supported Standing balance-Leahy Scale: Fair                              Cognition Arousal/Alertness: Awake/alert Behavior During Therapy: WFL for tasks assessed/performed Overall Cognitive Status: Within Functional Limits for tasks assessed                                           General Comments General comments (skin integrity, edema, etc.): VSS      Pertinent Vitals/Pain Pain Assessment: No/denies pain           PT Goals (current goals can now be found in the care plan section) Acute Rehab PT Goals Patient Stated Goal: be able to be herself again PT Goal Formulation: With patient Time For Goal Achievement: 07/05/18 Potential to Achieve Goals: Fair Progress towards PT goals: Progressing toward goals    Frequency    Min 2X/week      PT Plan Current plan remains appropriate       AM-PAC PT "6 Clicks" Mobility   Outcome Measure  Help needed turning from your back to your side while in a flat bed without using bedrails?: None Help needed  moving from lying on your back to sitting on the side of a flat bed without using bedrails?: None Help needed moving to and from a bed to a chair (including a wheelchair)?: None Help needed standing up from a chair using your arms (e.g., wheelchair or bedside chair)?: None Help needed to walk in hospital room?: A Little Help needed climbing 3-5 steps with a railing? : A Lot 6 Click Score: 21    End of Session Equipment Utilized During Treatment: Gait belt Activity Tolerance: Patient tolerated treatment well Patient left: in chair;with call bell/phone within reach Nurse Communication: Mobility status PT Visit Diagnosis: Other abnormalities of  gait and mobility (R26.89);Muscle weakness (generalized) (M62.81);Difficulty in walking, not elsewhere classified (R26.2)     Time: 1336-1400 PT Time Calculation (min) (ACUTE ONLY): 24 min  Charges:  $Gait Training: 23-37 mins                     Gage Treiber B. Migdalia Dk PT, DPT Acute Rehabilitation Services Pager [phone] Office [phone]    [place] 06/23/2018, 4:39 PM

## 2018-06-24 LAB — RENAL FUNCTION PANEL
Albumin: 2.9 g/dL — ABNORMAL LOW (ref 3.5–5.0)
Anion gap: 13 (ref 5–15)
BUN: 49 mg/dL — ABNORMAL HIGH (ref 8–23)
CO2: 24 mmol/L (ref 22–32)
Calcium: 9.1 mg/dL (ref 8.9–10.3)
Chloride: 97 mmol/L — ABNORMAL LOW (ref 98–111)
Creatinine, Ser: 8.27 mg/dL — ABNORMAL HIGH (ref 0.44–1.00)
GFR calc Af Amer: 5 mL/min — ABNORMAL LOW (ref >60)
GFR calc non Af Amer: 4 mL/min — ABNORMAL LOW (ref >60)
Glucose, Bld: 90 mg/dL (ref 70–99)
Phosphorus: 4.2 mg/dL (ref 2.5–4.6)
Potassium: 3.9 mmol/L (ref 3.5–5.1)
Sodium: 134 mmol/L — ABNORMAL LOW (ref 135–145)

## 2018-06-24 LAB — CBC
HCT: 27.8 % — ABNORMAL LOW (ref 36.0–46.0)
Hemoglobin: 8.6 g/dL — ABNORMAL LOW (ref 12.0–15.0)
MCH: 27.9 pg (ref 26.0–34.0)
MCHC: 30.9 g/dL (ref 30.0–36.0)
MCV: 90.3 fL (ref 80.0–100.0)
Platelets: 293 10*3/uL (ref 150–400)
RBC: 3.08 MIL/uL — ABNORMAL LOW (ref 3.87–5.11)
RDW: 17.8 % — ABNORMAL HIGH (ref 11.5–15.5)
WBC: 9.4 10*3/uL (ref 4.0–10.5)
nRBC: 1.2 % — ABNORMAL HIGH (ref 0.0–0.2)

## 2018-06-24 LAB — GLUCOSE, CAPILLARY: Glucose-Capillary: 97 mg/dL (ref 70–99)

## 2018-06-24 MED ORDER — MIDODRINE HCL 5 MG PO TABS
ORAL_TABLET | ORAL | Status: AC
  Filled 2018-06-24: qty 2

## 2018-06-24 MED ORDER — DOXERCALCIFEROL 4 MCG/2ML IV SOLN
INTRAVENOUS | Status: AC
  Filled 2018-06-24: qty 2

## 2018-06-24 MED ORDER — HEPARIN SODIUM (PORCINE) 1000 UNIT/ML IJ SOLN
INTRAMUSCULAR | Status: AC
  Administered 2018-06-24: 12100 [IU] via INTRAVENOUS_CENTRAL
  Filled 2018-06-24: qty 13

## 2018-06-24 MED ORDER — HEPARIN SODIUM (PORCINE) 1000 UNIT/ML DIALYSIS
100.0000 [IU]/kg | INTRAMUSCULAR | Status: DC | PRN
  Administered 2018-06-24: 08:00:00 12100 [IU] via INTRAVENOUS_CENTRAL

## 2018-06-24 NOTE — Progress Notes (Addendum)
[place] again to make sure pt can come in after 5pm. Staff told RN that no family is allowed to transport pt, RN let social worker know to arrange [place]. Pt stable, DC to SNL with PTAR with paper script for Xanax, all discharge paperwork is placed in package given to [facility]

## 2018-06-24 NOTE — Progress Notes (Signed)
Called [place] to give report to RN.

## 2018-06-24 NOTE — Discharge Summary (Signed)
.addendum to previous discharge [place]. Ref: [name], MD   Subjective:  Resting comfortably in dialysis unit. Awaiting SNF placement. VS stable. COVID test negative.  Objective:  Vital Signs in the last 24 hours: Temp:  [97.4 F (36.3 C)-98 F (36.7 C)] 97.4 F (36.3 C) (06/04 0720) Pulse Rate:  [70-81] 71 (06/04 0830) Cardiac Rhythm: Heart block (06/03 1904) Resp:  [14-18] 15 (06/04 0830) BP: (105-141)/(50-74) 138/71 (06/04 0830) SpO2:  [93 %-100 %] 94 % (06/04 0720) Weight:  [123.5 kg-123.6 kg] 123.6 kg (06/04 0720)  Physical Exam: BP Readings from Last 1 Encounters:  06/24/18 138/71     Wt Readings from Last 1 Encounters:  06/24/18 123.6 kg    Weight change: 1.469 kg Body mass index is 45.34 kg/m. HEENT: [place]/AT, Eyes-Brown, PERL, EOMI, Conjunctiva-Pale, Sclera-Non-icteric Neck: No JVD, No bruit, Trachea midline. Lungs:  Clear but decreased air entry at bases, Bilateral. Cardiac:  Regular rhythm, normal S1 and S2, no S3. II/VI systolic murmur. Abdomen:  Soft, non-tender. BS present. Extremities:  No edema present. No cyanosis. No clubbing. CNS: AxOx3, Cranial nerves grossly intact, moves all 4 extremities.  Skin: Warm and dry.   Intake/Output from previous day: 06/03 0701 - 06/04 0700 In: 480 [P.O.:480] Out: -     Lab Results: BMET    Component Value Date/Time   NA 134 (L) 06/24/2018 0742   NA 133 (L) 06/22/2018 1300   NA 134 (L) 06/19/2018 1414   K 3.9 06/24/2018 0742   K 4.4 06/22/2018 1300   K 4.2 06/19/2018 1414   CL 97 (L) 06/24/2018 0742   CL 93 (L) 06/22/2018 1300   CL 94 (L) 06/19/2018 1414   CO2 24 06/24/2018 0742   CO2 26 06/22/2018 1300   CO2 28 06/19/2018 1414   GLUCOSE 90 06/24/2018 0742   GLUCOSE 117 (H) 06/22/2018 1300   GLUCOSE 87 06/19/2018 1414   BUN 49 (H) 06/24/2018 0742   BUN 42 (H) 06/22/2018 1300   BUN 24 (H) 06/19/2018 1414   CREATININE 8.27 (H) 06/24/2018 0742   CREATININE 6.94 (H) 06/22/2018 1300   CREATININE  6.19 (H) 06/19/2018 1414   CREATININE 2.86 (H) 10/10/2010 1140   CREATININE 2.20 (H) 06/26/2010 1637   CALCIUM 9.1 06/24/2018 0742   CALCIUM 9.2 06/22/2018 1300   CALCIUM 9.6 06/19/2018 1414   CALCIUM 8.7 01/08/2010 1121   CALCIUM 9.1 10/08/2009 0922   CALCIUM 8.6 07/30/2009 1102   GFRNONAA 4 (L) 06/24/2018 0742   GFRNONAA 5 (L) 06/22/2018 1300   GFRNONAA 6 (L) 06/19/2018 1414   GFRAA 5 (L) 06/24/2018 0742   GFRAA 6 (L) 06/22/2018 1300   GFRAA 7 (L) 06/19/2018 1414   CBC    Component Value Date/Time   WBC 9.4 06/24/2018 0742   RBC 3.08 (L) 06/24/2018 0742   HGB 8.6 (L) 06/24/2018 0742   HCT 27.8 (L) 06/24/2018 0742   PLT 293 06/24/2018 0742   MCV 90.3 06/24/2018 0742   MCH 27.9 06/24/2018 0742   MCHC 30.9 06/24/2018 0742   RDW 17.8 (H) 06/24/2018 0742   LYMPHSABS 1.9 06/17/2018 1806   MONOABS 1.1 (H) 06/17/2018 1806   EOSABS 0.5 06/17/2018 1806   BASOSABS 0.1 06/17/2018 1806   HEPATIC Function Panel Recent Labs    01/23/18 0410 01/24/18 0632 06/17/18 1806  PROT 6.9 6.5 7.9   HEMOGLOBIN A1C No components found for: HGA1C,  MPG CARDIAC ENZYMES Lab Results  Component Value Date   CKTOTAL 87 04/13/2010   CKMB  1.0 04/13/2010   TROPONINI 0.03 (HH) 06/18/2018   TROPONINI 0.03 (HH) 06/18/2018   TROPONINI 0.03 (HH) 06/18/2018   BNP No results for input(s): PROBNP in the last 8760 hours. TSH Recent Labs    06/17/18 1806  TSH 1.825   CHOLESTEROL No results for input(s): CHOL in the last 8760 hours.  Scheduled Meds: . albuterol  2.5 mg Nebulization QID  . allopurinol  50 mg Oral Daily  . ALPRAZolam  0.25 mg Oral BID  . aspirin EC  81 mg Oral Daily  . atorvastatin  40 mg Oral Daily  . Chlorhexidine Gluconate Cloth  6 each Topical Q0600  . Chlorhexidine Gluconate Cloth  6 each Topical Q0600  . cinacalcet  90 mg Oral Q breakfast  . doxercalciferol      . doxercalciferol  3 mcg Intravenous Q T,Th,Sa-HD  . heparin  5,000 Units Subcutaneous Q8H  . hydrALAZINE   25 mg Oral BID  . insulin aspart  0-9 Units Subcutaneous TID WC  . midodrine      . midodrine  10 mg Oral Q T,Th,Sa-HD  . multivitamin with minerals  1 tablet Oral Daily  . sevelamer carbonate  3,200 mg Oral TID WC  . sodium chloride flush  3 mL Intravenous Q12H  . umeclidinium bromide  1 puff Inhalation Daily   Continuous Infusions: . sodium chloride     PRN Meds:.sodium chloride, heparin, sevelamer carbonate, sodium chloride flush  Assessment/Plan: Acute diastolic left heart failure, HFpEF ESRD Blind in right eye Obesity HTN COPD OSA with CPAP use Anemia of chronic disease Generalized anxiety  SNF post dialysis F/U in 1 week.   LOS: 7 days    Dixie Dials  MD  06/24/2018, 8:54 AM

## 2018-06-24 NOTE — Progress Notes (Signed)
Patient ID: [name], female   DOB: Oct 01, 1945, 73 y.o.   MRN: [id number]  [facility] Progress Note   Assessment/ Plan:   1.  Dyspnea/pulmonary edema: Improved with frequent dialysis/aggressive ultrafiltration and now is establish new dry weight with symptomatic improvement of shortness of breath 2. ESRD: Getting hemodialysis today on her usual outpatient TTS schedule, new EDW established during this hospitalization. 3. Anemia: Continue ESA with hemodialysis per outpatient schedule. 4. CKD-MBD: Calcium and phosphorus level currently acceptable, monitor on renal diet/Renvela.  On Hectorol and Sensipar for PTH suppression. 5. Nutrition: Continue renal diet with ONS. 6. Hypertension: Continue to monitor on midodrine for hypotension with efforts at ultrafiltration.  Subjective:   Reports that her shortness of breath has resolved and she anticipates discharge to skilled nursing facility today.   Objective:   BP (!) 107/56   Pulse 65   Temp (!) 97.4 F (36.3 C) (Oral)   Resp 15   Ht 5\' 5"  (1.651 m)   Wt 123.6 kg   SpO2 94%   BMI 45.34 kg/m   Physical Exam: Gen: Resting comfortably on hemodialysis. CVS: Pulse regular rhythm, normal rate, normal S1 and S2 Resp: Anteriorly clear breath sounds with no rales or rhonchi Abd: Soft, obese, nontender Ext: Trace ankle extremity edema, left upper arm AVF cannulated at hemodialysis  Labs: BMET Recent Labs  Lab 06/17/18 1806 06/18/18 0336 06/19/18 1414 06/22/18 1300 06/24/18 0742  NA 136 136 134* 133* 134*  K 3.7 4.0 4.2 4.4 3.9  CL 90* 92* 94* 93* 97*  CO2 33* 32 28 26 24   GLUCOSE 90 116* 87 117* 90  BUN 11 19 24* 42* 49*  CREATININE 4.48* 5.39* 6.19* 6.94* 8.27*  CALCIUM 9.2 9.1 9.6 9.2 9.1  PHOS  --   --  2.8 3.0 4.2   CBC Recent Labs  Lab 06/17/18 1806 06/18/18 0336 06/19/18 1414 06/22/18 1300 06/24/18 0742  WBC 7.5 6.5 7.8 10.0 9.4  NEUTROABS 4.0  --   --   --   --   HGB 8.9* 8.5* 8.9* 8.9* 8.6*   HCT 28.5* 27.3* 28.4* 28.3* 27.8*  MCV 88.8 89.5 89.6 89.6 90.3  PLT 229 214 243 293 293   Medications:    . albuterol  2.5 mg Nebulization QID  . allopurinol  50 mg Oral Daily  . ALPRAZolam  0.25 mg Oral BID  . aspirin EC  81 mg Oral Daily  . atorvastatin  40 mg Oral Daily  . Chlorhexidine Gluconate Cloth  6 each Topical Q0600  . Chlorhexidine Gluconate Cloth  6 each Topical Q0600  . cinacalcet  90 mg Oral Q breakfast  . doxercalciferol      . doxercalciferol  3 mcg Intravenous Q T,Th,Sa-HD  . heparin  5,000 Units Subcutaneous Q8H  . hydrALAZINE  25 mg Oral BID  . insulin aspart  0-9 Units Subcutaneous TID WC  . midodrine      . midodrine  10 mg Oral Q T,Th,Sa-HD  . multivitamin with minerals  1 tablet Oral Daily  . sevelamer carbonate  3,200 mg Oral TID WC  . sodium chloride flush  3 mL Intravenous Q12H  . umeclidinium bromide  1 puff Inhalation Daily   Elmarie Shiley, MD 06/24/2018, 9:38 AM

## 2018-06-24 NOTE — TOC Transition Note (Signed)
Transition of Care [facility]) - CM/SW Discharge Note   Patient Details  Name: [name] MRN: [id number] Date of Birth: 1945/09/26  Transition of Care [facility]) CM/SW Contact:  Eileen Stanford, LCSW Phone Number: 06/24/2018, 1:26 PM   Clinical Narrative:   Clinical Social Worker facilitated patient discharge including contacting patient family and facility to confirm patient discharge plans.  Clinical information faxed to facility and family agreeable with plan.  Per patient her daughter will transport her to SNF . Pt will go to [place] ([id number])  RN to call [phone] for report prior to discharge.   Final next level of care: Skilled Nursing Facility Barriers to Discharge: Continued Medical Work up   Patient Goals and CMS Choice Patient states their goals for this hospitalization and ongoing recovery are:: "to regain some strength" CMS Medicare.gov Compare Post Acute Care list provided to:: (Patient knew which facility she wanted) Choice offered to / list presented to : Patient  Discharge Placement              Patient chooses bed at: [facility]) Patient to be transferred to facility by: [place] Name of family member notified: Pt is alert and oriented Patient and family notified of of transfer: 06/24/18  Discharge Plan and Services In-house Referral: NA Discharge Planning Services: CM Consult Post Acute Care Choice: [place]          DME Arranged: N/A DME Agency: NA       HH Arranged: NA HH Agency: NA        Social Determinants of Health (SDOH) Interventions     Readmission Risk Interventions Readmission Risk Prevention Plan 06/23/2018 06/22/2018 06/21/2018  Transportation Screening - - Complete  PCP or Specialist Appt within 3-5 Days - Complete -  HRI or Home Care Consult - - Complete  Social Work Consult for [place] Planning/Counseling - - Complete  Palliative Care Screening - Not Applicable -  Medication Review [employer]) - -  Complete  PCP or Specialist appointment within 3-5 days of discharge Complete - -  HRI or Home Care Consult Complete - -  SW Recovery Care/Counseling Consult Complete - -  Palliative Care Screening Not Applicable - -  Skilled Nursing Facility Complete - -  Some recent data might be hidden

## 2018-06-24 NOTE — Procedures (Signed)
Patient seen on Hemodialysis. BP (!) 107/56   Pulse 65   Temp (!) 97.4 F (36.3 C) (Oral)   Resp 15   Ht 5\' 5"  (1.651 m)   Wt 123.6 kg   SpO2 94%   BMI 45.34 kg/m   QB 400, UF goal 2L Tolerating treatment without complaints at this time.   Elmarie Shiley MD [facility]. Office # [phone] Pager # [phone] 9:37 AM

## 2018-06-24 NOTE — Progress Notes (Signed)
Per Doylene Canard, MD, ok for pt to go to SNL without CPAP since she had been taken off of it for a year

## 2018-07-21 DIAGNOSIS — I509 Heart failure, unspecified: Secondary | ICD-10-CM | POA: Diagnosis not present

## 2018-07-21 DIAGNOSIS — J449 Chronic obstructive pulmonary disease, unspecified: Secondary | ICD-10-CM | POA: Diagnosis not present

## 2018-07-21 DIAGNOSIS — I132 Hypertensive heart and chronic kidney disease with heart failure and with stage 5 chronic kidney disease, or end stage renal disease: Secondary | ICD-10-CM | POA: Diagnosis not present

## 2018-07-21 DIAGNOSIS — Z992 Dependence on renal dialysis: Secondary | ICD-10-CM | POA: Diagnosis not present

## 2018-07-21 DIAGNOSIS — N186 End stage renal disease: Secondary | ICD-10-CM | POA: Diagnosis not present

## 2018-07-21 DIAGNOSIS — D638 Anemia in other chronic diseases classified elsewhere: Secondary | ICD-10-CM | POA: Diagnosis not present

## 2018-07-26 DIAGNOSIS — G4733 Obstructive sleep apnea (adult) (pediatric): Secondary | ICD-10-CM | POA: Diagnosis not present

## 2018-07-26 DIAGNOSIS — D638 Anemia in other chronic diseases classified elsewhere: Secondary | ICD-10-CM | POA: Diagnosis not present

## 2018-07-26 DIAGNOSIS — Z992 Dependence on renal dialysis: Secondary | ICD-10-CM | POA: Diagnosis not present

## 2018-07-26 DIAGNOSIS — J449 Chronic obstructive pulmonary disease, unspecified: Secondary | ICD-10-CM | POA: Diagnosis not present

## 2018-07-26 DIAGNOSIS — R0609 Other forms of dyspnea: Secondary | ICD-10-CM | POA: Diagnosis not present

## 2018-07-26 DIAGNOSIS — R531 Weakness: Secondary | ICD-10-CM | POA: Diagnosis not present

## 2018-07-26 DIAGNOSIS — I509 Heart failure, unspecified: Secondary | ICD-10-CM | POA: Diagnosis not present

## 2018-07-26 DIAGNOSIS — I132 Hypertensive heart and chronic kidney disease with heart failure and with stage 5 chronic kidney disease, or end stage renal disease: Secondary | ICD-10-CM | POA: Diagnosis not present

## 2018-08-10 IMAGING — DX DG CHEST 2V
2 series · 2 of 2 positions shown · non-contrast
Comparison: Chest radiograph October 16, 2016 and chest CT
October 16, 2016

CLINICAL DATA: Chest pain following dialysis

EXAM:
CHEST - 2 VIEW

[chest pa]
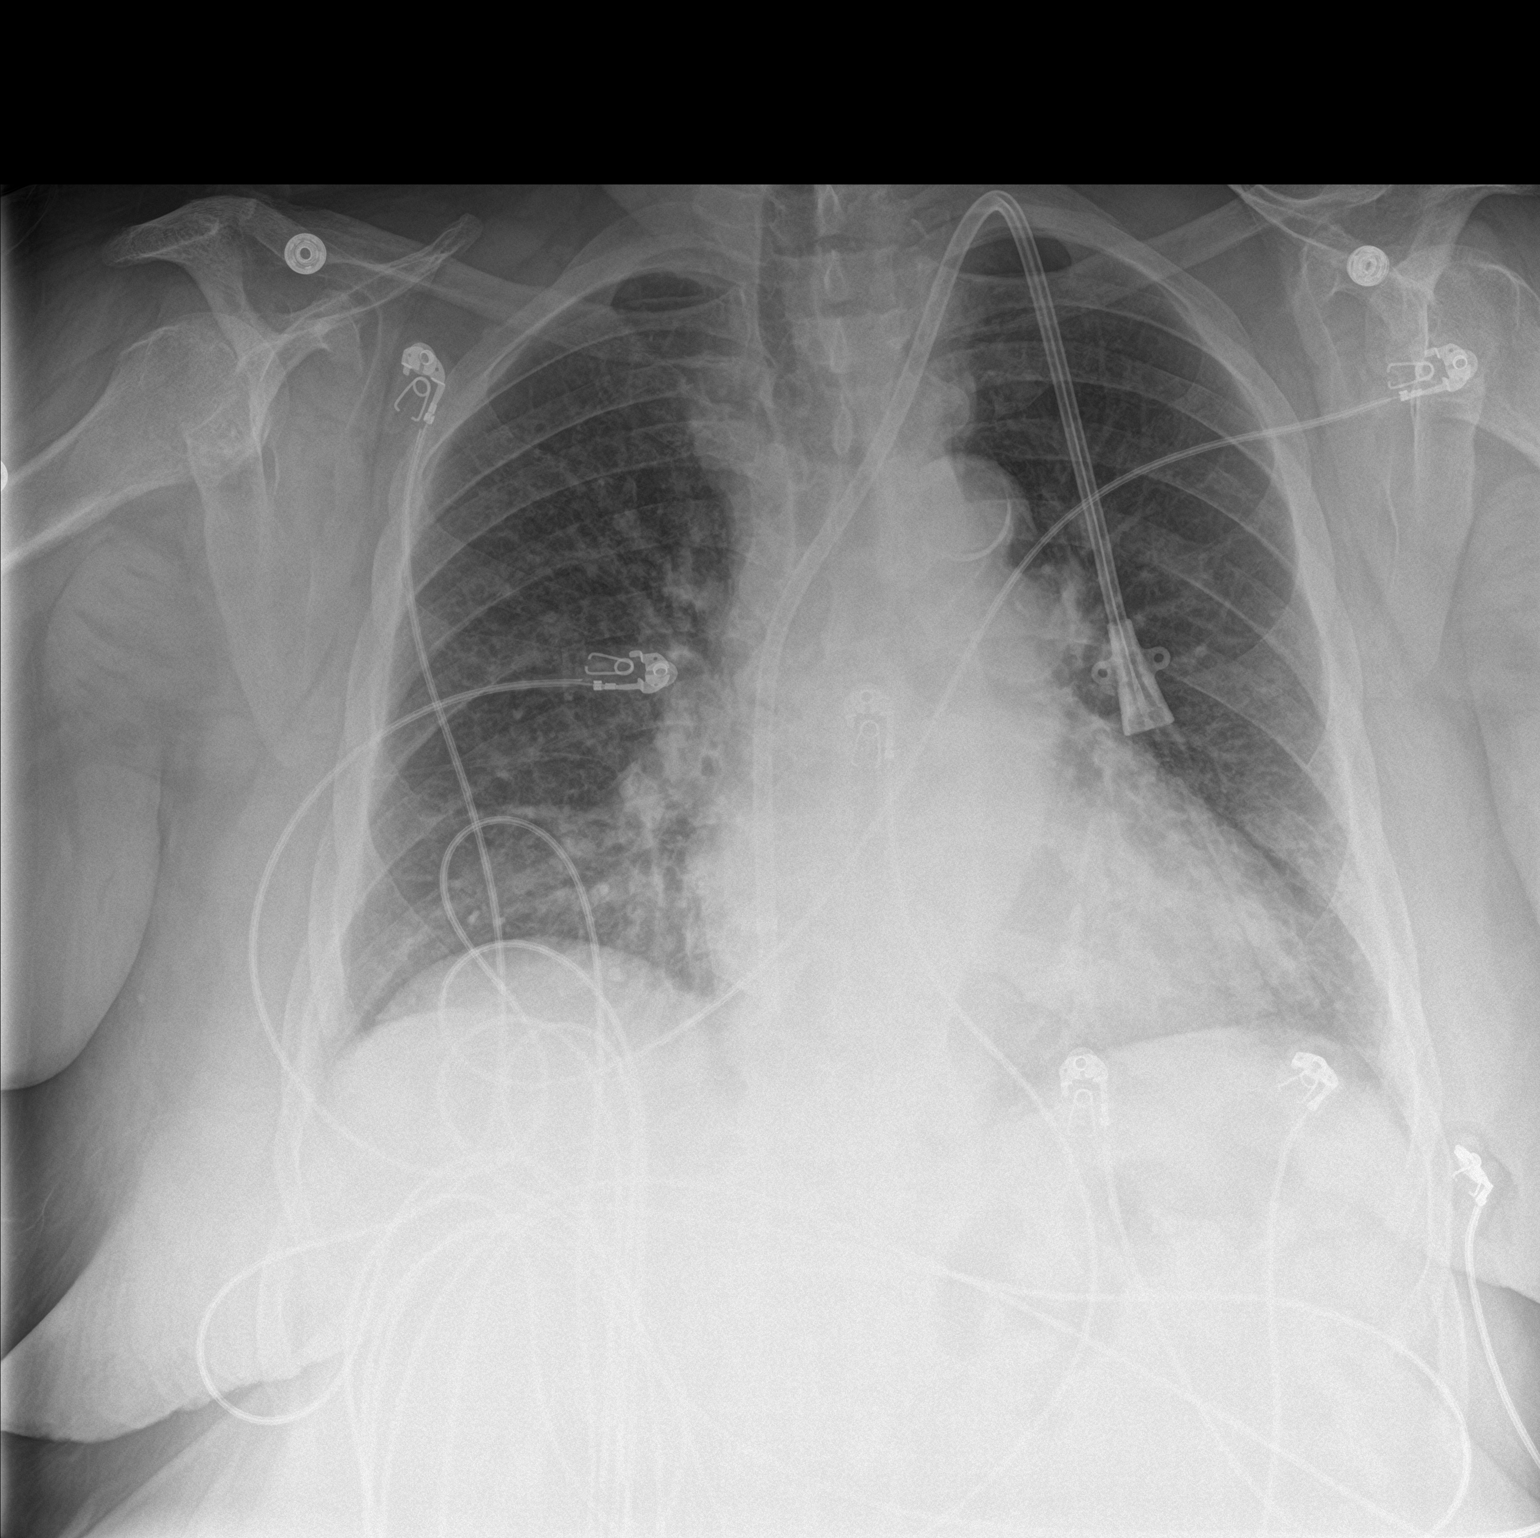

[chest lat]
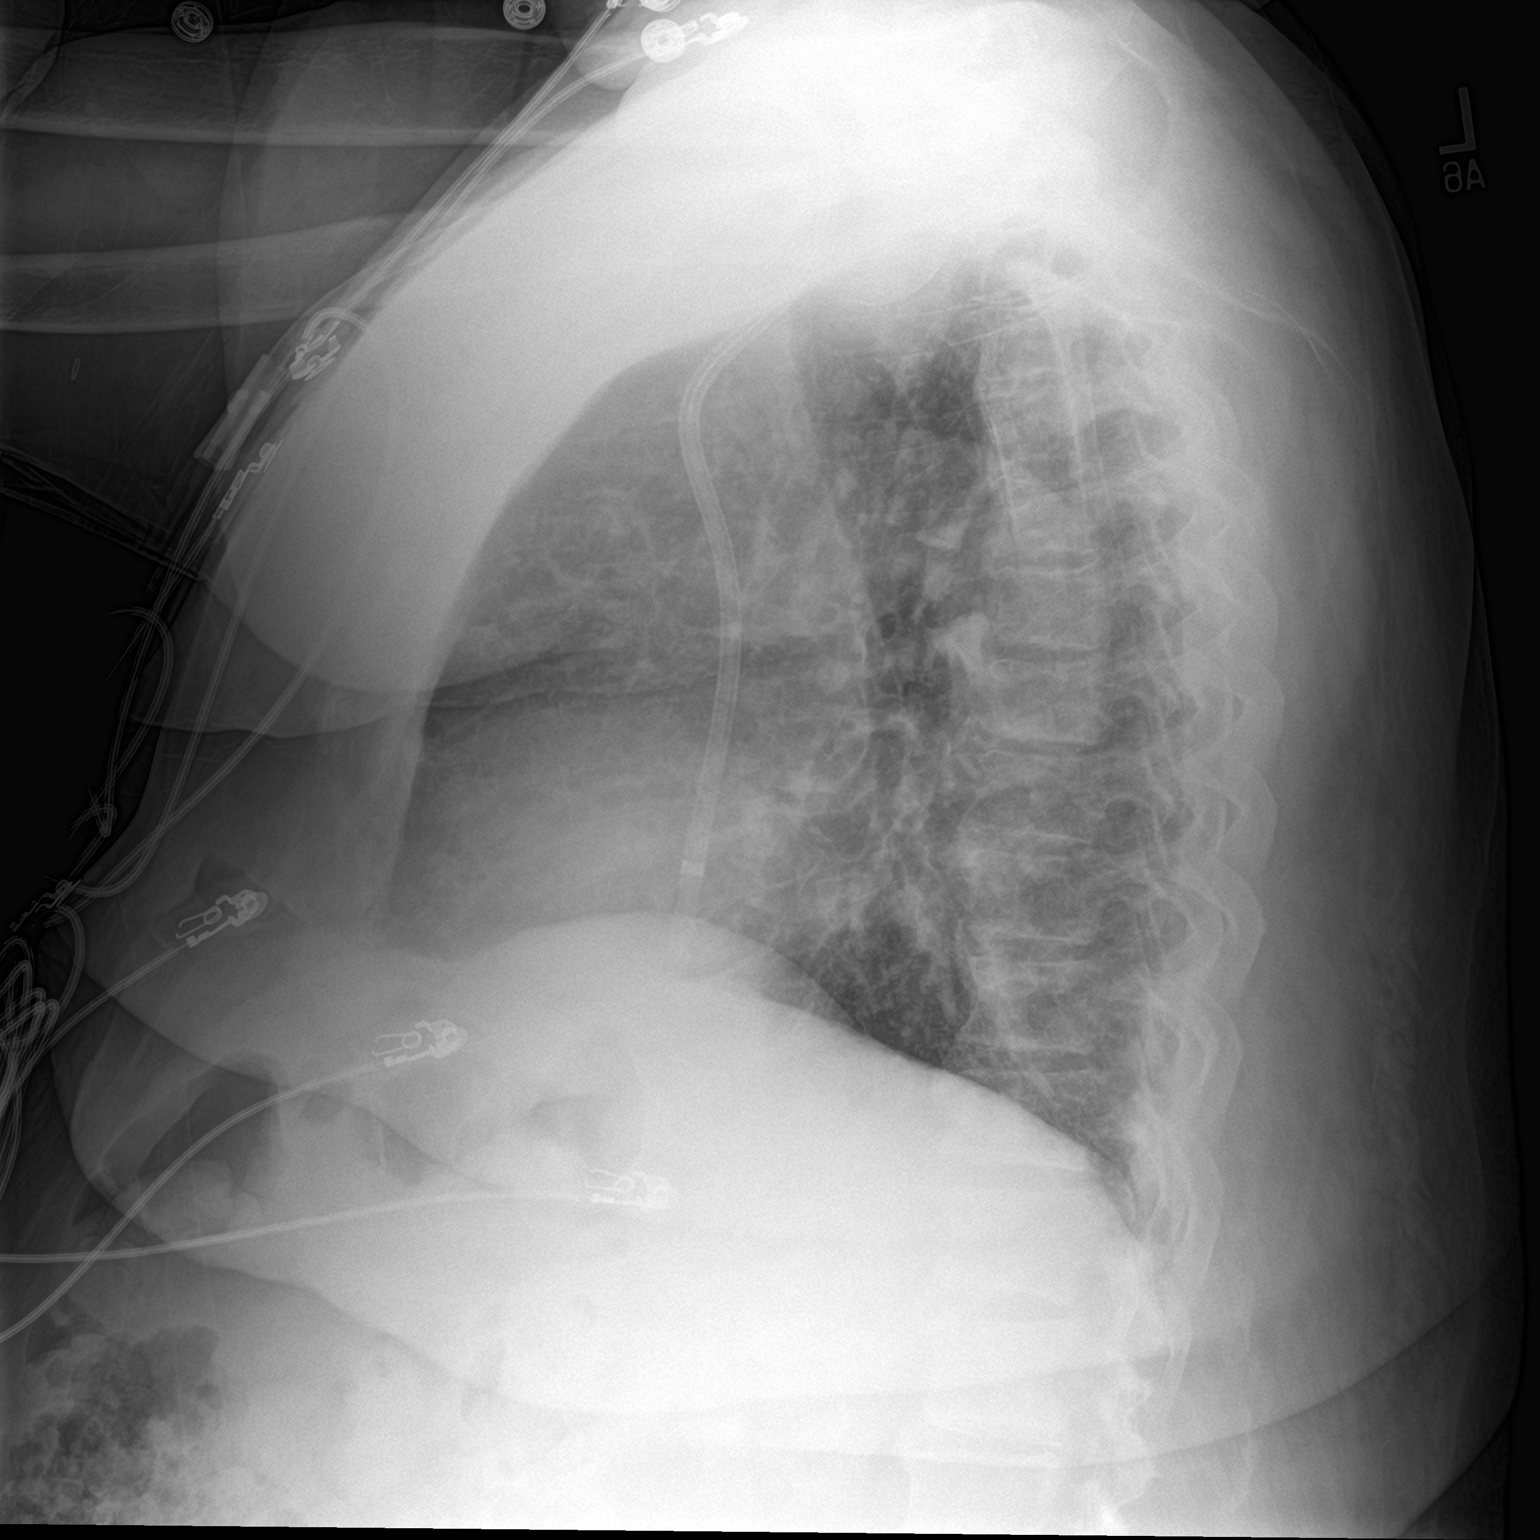

[2 of 2 positions shown; findings below may reference images not displayed]

FINDINGS: Central catheter tip is in the right atrium slightly distal to the
cavoatrial junction. No pneumothorax. There is no appreciable edema
or consolidation. Heart is upper normal in size with pulmonary
vascularity within normal limits. No adenopathy. There is aortic
atherosclerosis. No bone lesions.
IMPRESSION: Central catheter tip in right atrium. No pneumothorax. No edema or
consolidation. Heart upper normal in size. There is aortic
atherosclerosis.

Aortic Atherosclerosis ([id number]).

## 2018-10-04 ENCOUNTER — Inpatient Hospital Stay (HOSPITAL_COMMUNITY)
Admission: EM | Admit: 2018-10-04 | Discharge: 2018-10-12 | DRG: 291 | Disposition: A | Discharge status: Home / Self Care | Payer: Medicare Other | Attending: Internal Medicine | Admitting: Internal Medicine

## 2018-10-04 ENCOUNTER — Encounter (HOSPITAL_COMMUNITY): Payer: Self-pay | Admitting: Emergency Medicine

## 2018-10-04 ENCOUNTER — Other Ambulatory Visit: Payer: Self-pay

## 2018-10-04 DIAGNOSIS — N2581 Secondary hyperparathyroidism of renal origin: Secondary | ICD-10-CM | POA: Diagnosis present

## 2018-10-04 DIAGNOSIS — Z9989 Dependence on other enabling machines and devices: Secondary | ICD-10-CM

## 2018-10-04 DIAGNOSIS — Z8249 Family history of ischemic heart disease and other diseases of the circulatory system: Secondary | ICD-10-CM

## 2018-10-04 DIAGNOSIS — M549 Dorsalgia, unspecified: Secondary | ICD-10-CM | POA: Diagnosis not present

## 2018-10-04 DIAGNOSIS — I252 Old myocardial infarction: Secondary | ICD-10-CM

## 2018-10-04 DIAGNOSIS — I9589 Other hypotension: Secondary | ICD-10-CM | POA: Diagnosis present

## 2018-10-04 DIAGNOSIS — E1169 Type 2 diabetes mellitus with other specified complication: Secondary | ICD-10-CM | POA: Diagnosis present

## 2018-10-04 DIAGNOSIS — Z6841 Body Mass Index (BMI) 40.0 and over, adult: Secondary | ICD-10-CM

## 2018-10-04 DIAGNOSIS — E1129 Type 2 diabetes mellitus with other diabetic kidney complication: Secondary | ICD-10-CM | POA: Diagnosis present

## 2018-10-04 DIAGNOSIS — I132 Hypertensive heart and chronic kidney disease with heart failure and with stage 5 chronic kidney disease, or end stage renal disease: Secondary | ICD-10-CM | POA: Diagnosis not present

## 2018-10-04 DIAGNOSIS — Z808 Family history of malignant neoplasm of other organs or systems: Secondary | ICD-10-CM

## 2018-10-04 DIAGNOSIS — Z7951 Long term (current) use of inhaled steroids: Secondary | ICD-10-CM

## 2018-10-04 DIAGNOSIS — M5136 Other intervertebral disc degeneration, lumbar region: Secondary | ICD-10-CM | POA: Diagnosis present

## 2018-10-04 DIAGNOSIS — I5033 Acute on chronic diastolic (congestive) heart failure: Secondary | ICD-10-CM | POA: Diagnosis present

## 2018-10-04 DIAGNOSIS — E785 Hyperlipidemia, unspecified: Secondary | ICD-10-CM | POA: Diagnosis present

## 2018-10-04 DIAGNOSIS — Z961 Presence of intraocular lens: Secondary | ICD-10-CM | POA: Diagnosis present

## 2018-10-04 DIAGNOSIS — D631 Anemia in chronic kidney disease: Secondary | ICD-10-CM | POA: Diagnosis present

## 2018-10-04 DIAGNOSIS — J449 Chronic obstructive pulmonary disease, unspecified: Secondary | ICD-10-CM | POA: Diagnosis present

## 2018-10-04 DIAGNOSIS — E8770 Fluid overload, unspecified: Secondary | ICD-10-CM | POA: Diagnosis present

## 2018-10-04 DIAGNOSIS — M48061 Spinal stenosis, lumbar region without neurogenic claudication: Secondary | ICD-10-CM | POA: Diagnosis present

## 2018-10-04 DIAGNOSIS — E8779 Other fluid overload: Secondary | ICD-10-CM

## 2018-10-04 DIAGNOSIS — Z9842 Cataract extraction status, left eye: Secondary | ICD-10-CM

## 2018-10-04 DIAGNOSIS — N186 End stage renal disease: Secondary | ICD-10-CM | POA: Diagnosis present

## 2018-10-04 DIAGNOSIS — H5461 Unqualified visual loss, right eye, normal vision left eye: Secondary | ICD-10-CM | POA: Diagnosis present

## 2018-10-04 DIAGNOSIS — E1140 Type 2 diabetes mellitus with diabetic neuropathy, unspecified: Secondary | ICD-10-CM | POA: Diagnosis present

## 2018-10-04 DIAGNOSIS — M5126 Other intervertebral disc displacement, lumbar region: Secondary | ICD-10-CM | POA: Diagnosis present

## 2018-10-04 DIAGNOSIS — J9601 Acute respiratory failure with hypoxia: Secondary | ICD-10-CM | POA: Diagnosis present

## 2018-10-04 DIAGNOSIS — I5032 Chronic diastolic (congestive) heart failure: Secondary | ICD-10-CM | POA: Diagnosis present

## 2018-10-04 DIAGNOSIS — E1122 Type 2 diabetes mellitus with diabetic chronic kidney disease: Secondary | ICD-10-CM | POA: Diagnosis present

## 2018-10-04 DIAGNOSIS — Z87891 Personal history of nicotine dependence: Secondary | ICD-10-CM

## 2018-10-04 DIAGNOSIS — Z20828 Contact with and (suspected) exposure to other viral communicable diseases: Secondary | ICD-10-CM | POA: Diagnosis present

## 2018-10-04 DIAGNOSIS — E875 Hyperkalemia: Secondary | ICD-10-CM | POA: Diagnosis present

## 2018-10-04 DIAGNOSIS — Z90710 Acquired absence of both cervix and uterus: Secondary | ICD-10-CM

## 2018-10-04 DIAGNOSIS — M5431 Sciatica, right side: Secondary | ICD-10-CM

## 2018-10-04 DIAGNOSIS — G8929 Other chronic pain: Secondary | ICD-10-CM | POA: Diagnosis present

## 2018-10-04 DIAGNOSIS — G4733 Obstructive sleep apnea (adult) (pediatric): Secondary | ICD-10-CM

## 2018-10-04 DIAGNOSIS — Z992 Dependence on renal dialysis: Secondary | ICD-10-CM

## 2018-10-04 DIAGNOSIS — Z7982 Long term (current) use of aspirin: Secondary | ICD-10-CM

## 2018-10-04 DIAGNOSIS — M5432 Sciatica, left side: Secondary | ICD-10-CM

## 2018-10-04 DIAGNOSIS — M109 Gout, unspecified: Secondary | ICD-10-CM | POA: Diagnosis present

## 2018-10-04 DIAGNOSIS — E8889 Other specified metabolic disorders: Secondary | ICD-10-CM | POA: Diagnosis present

## 2018-10-04 DIAGNOSIS — Z79899 Other long term (current) drug therapy: Secondary | ICD-10-CM

## 2018-10-04 DIAGNOSIS — I2510 Atherosclerotic heart disease of native coronary artery without angina pectoris: Secondary | ICD-10-CM | POA: Diagnosis present

## 2018-10-04 DIAGNOSIS — Z9841 Cataract extraction status, right eye: Secondary | ICD-10-CM

## 2018-10-04 DIAGNOSIS — Z96652 Presence of left artificial knee joint: Secondary | ICD-10-CM | POA: Diagnosis present

## 2018-10-04 DIAGNOSIS — Z888 Allergy status to other drugs, medicaments and biological substances status: Secondary | ICD-10-CM

## 2018-10-04 DIAGNOSIS — Z23 Encounter for immunization: Secondary | ICD-10-CM

## 2018-10-04 NOTE — ED Triage Notes (Signed)
Per EMS, pt from home, woke up from nap w/ "horrible back pain" that increases w/ movement.  Pt denies any injuries or hx.  Pt is a dialysis pt (T,TH, S), last dialysis was changed to last Friday, when as planned.   196/90 [id number] Pulse 82 99% on RA

## 2018-10-05 ENCOUNTER — Observation Stay (HOSPITAL_COMMUNITY): Payer: Medicare Other

## 2018-10-05 ENCOUNTER — Emergency Department (HOSPITAL_COMMUNITY): Payer: Medicare Other

## 2018-10-05 DIAGNOSIS — N186 End stage renal disease: Secondary | ICD-10-CM | POA: Diagnosis not present

## 2018-10-05 DIAGNOSIS — E875 Hyperkalemia: Secondary | ICD-10-CM | POA: Diagnosis not present

## 2018-10-05 DIAGNOSIS — E1122 Type 2 diabetes mellitus with diabetic chronic kidney disease: Secondary | ICD-10-CM

## 2018-10-05 DIAGNOSIS — Z992 Dependence on renal dialysis: Secondary | ICD-10-CM

## 2018-10-05 DIAGNOSIS — Z794 Long term (current) use of insulin: Secondary | ICD-10-CM

## 2018-10-05 DIAGNOSIS — E8770 Fluid overload, unspecified: Secondary | ICD-10-CM | POA: Diagnosis present

## 2018-10-05 DIAGNOSIS — Z9989 Dependence on other enabling machines and devices: Secondary | ICD-10-CM

## 2018-10-05 DIAGNOSIS — M545 Low back pain: Secondary | ICD-10-CM | POA: Diagnosis not present

## 2018-10-05 DIAGNOSIS — E119 Type 2 diabetes mellitus without complications: Secondary | ICD-10-CM

## 2018-10-05 DIAGNOSIS — G4733 Obstructive sleep apnea (adult) (pediatric): Secondary | ICD-10-CM

## 2018-10-05 LAB — CBC
HCT: 35.8 % — ABNORMAL LOW (ref 36.0–46.0)
Hemoglobin: 11.6 g/dL — ABNORMAL LOW (ref 12.0–15.0)
MCH: 29.3 pg (ref 26.0–34.0)
MCHC: 32.4 g/dL (ref 30.0–36.0)
MCV: 90.4 fL (ref 80.0–100.0)
Platelets: 207 10*3/uL (ref 150–400)
RBC: 3.96 MIL/uL (ref 3.87–5.11)
RDW: 16.8 % — ABNORMAL HIGH (ref 11.5–15.5)
WBC: 6 10*3/uL (ref 4.0–10.5)
nRBC: 0 % (ref 0.0–0.2)

## 2018-10-05 LAB — COMPREHENSIVE METABOLIC PANEL
ALT: 13 U/L (ref 0–44)
AST: 18 U/L (ref 15–41)
Albumin: 3.4 g/dL — ABNORMAL LOW (ref 3.5–5.0)
Alkaline Phosphatase: 52 U/L (ref 38–126)
Anion gap: 19 — ABNORMAL HIGH (ref 5–15)
BUN: 62 mg/dL — ABNORMAL HIGH (ref 8–23)
CO2: 28 mmol/L (ref 22–32)
Calcium: 9.4 mg/dL (ref 8.9–10.3)
Chloride: 93 mmol/L — ABNORMAL LOW (ref 98–111)
Creatinine, Ser: 13.11 mg/dL — ABNORMAL HIGH (ref 0.44–1.00)
GFR calc Af Amer: 3 mL/min — ABNORMAL LOW (ref >60)
GFR calc non Af Amer: 2 mL/min — ABNORMAL LOW (ref >60)
Glucose, Bld: 118 mg/dL — ABNORMAL HIGH (ref 70–99)
Potassium: 5.4 mmol/L — ABNORMAL HIGH (ref 3.5–5.1)
Sodium: 140 mmol/L (ref 135–145)
Total Bilirubin: 0.7 mg/dL (ref 0.3–1.2)
Total Protein: 7.6 g/dL (ref 6.5–8.1)

## 2018-10-05 LAB — SARS CORONAVIRUS 2 BY RT PCR (HOSPITAL ORDER, PERFORMED IN ~~LOC~~ HOSPITAL LAB): SARS Coronavirus 2: NEGATIVE

## 2018-10-05 MED ORDER — HYDROMORPHONE HCL 1 MG/ML IJ SOLN
1.0000 mg | Freq: Once | INTRAMUSCULAR | Status: AC
  Administered 2018-10-05: 11:00:00 1 mg via INTRAVENOUS
  Filled 2018-10-05: qty 1

## 2018-10-05 MED ORDER — PREDNISONE 20 MG PO TABS
60.0000 mg | ORAL_TABLET | Freq: Once | ORAL | Status: AC
  Administered 2018-10-05: 14:00:00 60 mg via ORAL
  Filled 2018-10-05: qty 3

## 2018-10-05 MED ORDER — MIDODRINE HCL 5 MG PO TABS: 10.0000 mg | ORAL_TABLET | ORAL | Status: DC

## 2018-10-05 MED ORDER — INSULIN ASPART 100 UNIT/ML ~~LOC~~ SOLN
0.0000 [IU] | Freq: Three times a day (TID) | SUBCUTANEOUS | Status: DC
  Administered 2018-10-05: 2 [IU] via SUBCUTANEOUS
  Administered 2018-10-06 – 2018-10-07 (×2): 1 [IU] via SUBCUTANEOUS
  Administered 2018-10-07: 2 [IU] via SUBCUTANEOUS
  Administered 2018-10-08 – 2018-10-10 (×3): 1 [IU] via SUBCUTANEOUS
  Administered 2018-10-11: 3 [IU] via SUBCUTANEOUS
  Administered 2018-10-11: 1 [IU] via SUBCUTANEOUS

## 2018-10-05 MED ORDER — HEPARIN SODIUM (PORCINE) 5000 UNIT/ML IJ SOLN
5000.0000 [IU] | Freq: Three times a day (TID) | INTRAMUSCULAR | Status: DC
  Administered 2018-10-05 – 2018-10-12 (×20): 5000 [IU] via SUBCUTANEOUS
  Filled 2018-10-05 (×21): qty 1

## 2018-10-05 MED ORDER — ACETAMINOPHEN 650 MG RE SUPP: 650.0000 mg | Freq: Four times a day (QID) | RECTAL | Status: DC | PRN

## 2018-10-05 MED ORDER — OXYCODONE-ACETAMINOPHEN 5-325 MG PO TABS
1.0000 | ORAL_TABLET | Freq: Four times a day (QID) | ORAL | Status: DC | PRN
  Administered 2018-10-05 – 2018-10-12 (×11): 1 via ORAL
  Filled 2018-10-05 (×12): qty 1

## 2018-10-05 MED ORDER — CINACALCET HCL 30 MG PO TABS
60.0000 mg | ORAL_TABLET | ORAL | Status: DC
  Administered 2018-10-07 – 2018-10-09 (×2): 60 mg via ORAL
  Filled 2018-10-05 (×3): qty 2

## 2018-10-05 MED ORDER — CINACALCET HCL 30 MG PO TABS
90.0000 mg | ORAL_TABLET | ORAL | Status: DC
  Filled 2018-10-05: qty 3

## 2018-10-05 MED ORDER — MIDODRINE HCL 5 MG PO TABS
10.0000 mg | ORAL_TABLET | ORAL | Status: DC
  Administered 2018-10-05 – 2018-10-12 (×4): 10 mg via ORAL
  Filled 2018-10-05: qty 2

## 2018-10-05 MED ORDER — MORPHINE SULFATE (PF) 4 MG/ML IV SOLN
4.0000 mg | Freq: Once | INTRAVENOUS | Status: AC
  Administered 2018-10-05: 4 mg via INTRAVENOUS
  Filled 2018-10-05: qty 1

## 2018-10-05 MED ORDER — ALPRAZOLAM 0.25 MG PO TABS
0.2500 mg | ORAL_TABLET | Freq: Every day | ORAL | Status: DC
  Administered 2018-10-06 – 2018-10-11 (×7): 0.25 mg via ORAL
  Filled 2018-10-05 (×7): qty 1

## 2018-10-05 MED ORDER — MIDODRINE HCL 5 MG PO TABS
ORAL_TABLET | ORAL | Status: AC
  Filled 2018-10-05: qty 2

## 2018-10-05 MED ORDER — ARFORMOTEROL TARTRATE 15 MCG/2ML IN NEBU
15.0000 ug | INHALATION_SOLUTION | Freq: Two times a day (BID) | RESPIRATORY_TRACT | Status: DC
  Administered 2018-10-06 – 2018-10-12 (×12): 15 ug via RESPIRATORY_TRACT
  Filled 2018-10-05 (×15): qty 2

## 2018-10-05 MED ORDER — LIDOCAINE 5 % EX PTCH
1.0000 | MEDICATED_PATCH | CUTANEOUS | Status: DC
  Administered 2018-10-05 – 2018-10-11 (×7): 1 via TRANSDERMAL
  Filled 2018-10-05 (×7): qty 1

## 2018-10-05 MED ORDER — ATORVASTATIN CALCIUM 40 MG PO TABS
40.0000 mg | ORAL_TABLET | Freq: Every day | ORAL | Status: DC
  Administered 2018-10-06 – 2018-10-12 (×7): 40 mg via ORAL
  Filled 2018-10-05 (×7): qty 1

## 2018-10-05 MED ORDER — HEPARIN SODIUM (PORCINE) 1000 UNIT/ML IJ SOLN
INTRAMUSCULAR | Status: AC
  Administered 2018-10-05: 1000 [IU]
  Filled 2018-10-05: qty 6

## 2018-10-05 MED ORDER — ALBUTEROL SULFATE (2.5 MG/3ML) 0.083% IN NEBU: 2.5000 mg | INHALATION_SOLUTION | Freq: Four times a day (QID) | RESPIRATORY_TRACT | Status: DC | PRN

## 2018-10-05 MED ORDER — ONDANSETRON HCL 4 MG PO TABS
4.0000 mg | ORAL_TABLET | Freq: Four times a day (QID) | ORAL | Status: DC | PRN
  Administered 2018-10-08: 4 mg via ORAL
  Filled 2018-10-05: qty 1

## 2018-10-05 MED ORDER — MIDODRINE HCL 5 MG PO TABS
10.0000 mg | ORAL_TABLET | ORAL | Status: DC
  Administered 2018-10-07: 10 mg via ORAL
  Filled 2018-10-05: qty 2

## 2018-10-05 MED ORDER — RENA-VITE PO TABS
1.0000 | ORAL_TABLET | Freq: Every day | ORAL | Status: DC
  Administered 2018-10-05 – 2018-10-12 (×8): 1 via ORAL
  Filled 2018-10-05 (×8): qty 1

## 2018-10-05 MED ORDER — ONDANSETRON HCL 4 MG/2ML IJ SOLN: 4.0000 mg | Freq: Four times a day (QID) | INTRAMUSCULAR | Status: DC | PRN

## 2018-10-05 MED ORDER — HYDRALAZINE HCL 25 MG PO TABS: 25.0000 mg | ORAL_TABLET | Freq: Two times a day (BID) | ORAL | Status: DC

## 2018-10-05 MED ORDER — SEVELAMER CARBONATE 800 MG PO TABS
3200.0000 mg | ORAL_TABLET | Freq: Three times a day (TID) | ORAL | Status: DC
  Administered 2018-10-05 – 2018-10-12 (×17): 3200 mg via ORAL
  Filled 2018-10-05 (×20): qty 4

## 2018-10-05 MED ORDER — ONDANSETRON HCL 4 MG/2ML IJ SOLN
4.0000 mg | Freq: Once | INTRAMUSCULAR | Status: AC
  Administered 2018-10-05: 11:00:00 4 mg via INTRAVENOUS
  Filled 2018-10-05: qty 2

## 2018-10-05 MED ORDER — ALLOPURINOL 100 MG PO TABS
50.0000 mg | ORAL_TABLET | Freq: Every day | ORAL | Status: DC | PRN
  Administered 2018-10-08: 50 mg via ORAL
  Filled 2018-10-05: qty 0.5
  Filled 2018-10-05: qty 1

## 2018-10-05 MED ORDER — ACETAMINOPHEN 325 MG PO TABS
650.0000 mg | ORAL_TABLET | Freq: Four times a day (QID) | ORAL | Status: DC | PRN
  Administered 2018-10-06 – 2018-10-11 (×4): 650 mg via ORAL
  Filled 2018-10-05 (×4): qty 2

## 2018-10-05 MED ORDER — UMECLIDINIUM BROMIDE 62.5 MCG/INH IN AEPB
1.0000 | INHALATION_SPRAY | Freq: Every day | RESPIRATORY_TRACT | Status: DC
  Administered 2018-10-06 – 2018-10-08 (×3): 1 via RESPIRATORY_TRACT
  Filled 2018-10-05: qty 7

## 2018-10-05 MED ORDER — ACETAMINOPHEN 500 MG PO TABS
1000.0000 mg | ORAL_TABLET | Freq: Once | ORAL | Status: AC
  Administered 2018-10-05: 1000 mg via ORAL
  Filled 2018-10-05: qty 2

## 2018-10-05 MED ORDER — BUDESONIDE 0.5 MG/2ML IN SUSP
0.5000 mg | Freq: Two times a day (BID) | RESPIRATORY_TRACT | Status: DC
  Administered 2018-10-06 – 2018-10-12 (×12): 0.5 mg via RESPIRATORY_TRACT
  Filled 2018-10-05 (×15): qty 2

## 2018-10-05 MED ORDER — SODIUM CHLORIDE 0.9% FLUSH
3.0000 mL | Freq: Two times a day (BID) | INTRAVENOUS | Status: DC
  Administered 2018-10-06 – 2018-10-11 (×11): 3 mL via INTRAVENOUS

## 2018-10-05 MED ORDER — ASPIRIN EC 81 MG PO TBEC
81.0000 mg | DELAYED_RELEASE_TABLET | Freq: Every day | ORAL | Status: DC
  Administered 2018-10-06 – 2018-10-12 (×7): 81 mg via ORAL
  Filled 2018-10-05 (×7): qty 1

## 2018-10-05 NOTE — ED Provider Notes (Addendum)
[facility] EMERGENCY DEPARTMENT Provider Note   CSN: [id number] Arrival date & time: 10/04/18  1930     History   Chief Complaint Chief Complaint  Patient presents with  . Back Pain    HPI [name] is a 73 y.o. female.     Patient with hx esrd/hd, recent missed dialysis with no dialysis in past 4 days. +DPE, generally weak. Also c/o low back pain. Hx ddd and prior back surgery. States yesterday AM, after getting up, noted increased pain to lower back radiating down bil legs. Pain acute onset, constant, severe, radiating. Similar to prior back pain but worse - states takes hydrocodone at home for pain. Patient indicates trying to move about house, change position, trying to walk, all made pain worse. Patient states went back to bed, but pain persisted all day, and that it is too painful to try to move to get up, stand or walk. No saddle area numbness. No leg numbness or weakness. No urinary retention or incontinence. No bowel incontinence. No fever or chills.   The history is provided by the patient and the EMS personnel.  Back Pain Associated symptoms: no abdominal pain, no chest pain, no dysuria, no fever, no headaches, no numbness and no weakness     Past Medical History:  Diagnosis Date  . Abdominal abscess 12-17-10   abdominal abscesses x2 ? one at this time  . Anemia   . Arthritis    "all over" (08/23/2012)  . Blind right eye    fell and crushed socket and eye fell out, was replaced [facility]  . CHF (congestive heart failure) ([place])   . Chronic lower back pain   . Coronary artery disease    normal coronaries by 08/25/12 cath  . Dyspnea   . Dysrhythmia    irregular, "skips beats"  . ESRD (end stage renal disease) on dialysis ([place])    "started 12/2011; [place]; TTS" (08/23/2012  . Exertional shortness of breath   . Eye drainage    "lots; since fall 07/2011" (08/23/2012)  . Gout   . H/O hiatal hernia   . Headache(784.0)    "weekly sometimes;  since I fell and hit my head in 07/2011 08/23/2012)  . Heart murmur   . History of blood transfusion    "lots before starting dialysis" (08/23/2012)  . Hypertension    sees Dr. Willey Blade  . Inhalation injury    "worked at [employer]; can't inhale polyurethane or paint, etc" (08/23/2012)  . Myocardial infarction ([place]) 1970's or 1980's  . Neuromuscular disorder ([place])    diabetic neuropathy  . OSA on CPAP   . Staph aureus infection November 2012   . Swelling of both ankles 12-17-10  . Type II diabetes mellitus ([place])    "over 10 years now" (08/23/2012)    Patient Active Problem List   Diagnosis Date Noted  . Shortness of breath 06/17/2018  . Acute left systolic heart failure ([place]) 06/17/2018  . Eye pain, left 01/25/2018  . Acute bronchitis due to Rhinovirus 01/18/2018  . Respiratory distress, acute 01/18/2018  . Essential hypertension 01/17/2018  . HLD (hyperlipidemia) 01/17/2018  . Chronic diastolic CHF (congestive heart failure) ([place]) 01/16/2018  . Centrilobular emphysema ([place]) 01/16/2018  . Acute bronchitis 01/16/2018  . Heme + stool 08/24/2017  . Iron deficiency anemia, unspecified 08/24/2017  . Chest pain 04/06/2017  . Unstable angina ([place]) 04/02/2017  . Hypoxemia 10/13/2016  . Acute on chronic combined systolic and diastolic  CHF (congestive heart failure) ([place]) 10/13/2016  . Anemia due to chronic kidney disease 10/13/2016  . Encephalopathy, metabolic   . Tremor   . Weakness   . Acute hyperkalemia 12/04/2014  . Neurological deficit present 12/04/2014  . Hypercalcemia 12/04/2014  . OSA    . Myocardial infarction ([place])   . Blind right eye   . Coronary artery disease   . CHF (congestive heart failure) ([place])   . ESRD needing dialysis ([place])   . Hyperkalemia 08/05/2014  . Type II diabetes mellitus with renal manifestations ([place]) 01/18/2014  . Arm pain 02/27/2012  . Other complications due to renal dialysis device, implant, and graft 02/13/2012  . Pain in limb 11/21/2011   . Swollen limb 11/21/2011  . Itching 11/21/2011  . End stage renal disease ([place]) 11/07/2011  . Chronic kidney disease, stage IV (severe) ([place]) 10/24/2011  . Chronic kidney disease (CKD), stage IV (severe) ([place]) 08/29/2011    Past Surgical History:  Procedure Laterality Date  . [place] VITRECTOMY WITH 20 GAUGE MVR PORT Left 05/07/2017   Procedure: 25 GAUGE PARS PLANA VITRECTOMY WITH ENDOLASER LEFT EYE;  Surgeon: Bernarda Caffey, MD;  Location: [facility];  Service: Ophthalmology;  Laterality: Left;  . APPENDECTOMY    . AV FISTULA PLACEMENT  09/30/2011   Procedure: ARTERIOVENOUS (AV) FISTULA CREATION;  Surgeon: Conrad [place], MD;  Location: [facility] [facility];  Service: Vascular;  Laterality: Left;  BRACHIAL-CEPHALIC  . BACK SURGERY    . BASCILIC VEIN TRANSPOSITION Left 11/10/2011   Left arm   . [place] TRANSPOSITION  01/28/2012   Procedure: BASCILIC VEIN TRANSPOSITION;  Surgeon: Conrad Stacy, MD;  Location: [facility];  Service: Vascular;  Laterality: Left;  Second Stage   . BIOPSY  08/24/2017   Procedure: BIOPSY;  Surgeon: Wilford Corner, MD;  Location: [facility];  Service: Endoscopy;;  . CARDIAC CATHETERIZATION  1980's  . CATARACT EXTRACTION W/ INTRAOCULAR LENS IMPLANT Bilateral 1990's  . COLONOSCOPY WITH PROPOFOL N/A 08/24/2017   Procedure: COLONOSCOPY WITH PROPOFOL;  Surgeon: Wilford Corner, MD;  [facility]: [facility];  Service: Endoscopy;  Laterality: N/A;  . CORONARY ANGIOGRAPHY N/A 04/07/2017   Procedure: CORONARY ANGIOGRAPHY (CATH LAB);  Surgeon: Dixie Dials, MD;  Location: [facility] CV LAB;  Service: Cardiovascular;  Laterality: N/A;  . DILATION AND CURETTAGE OF UTERUS  1960's  . ESOPHAGOGASTRODUODENOSCOPY (EGD) WITH PROPOFOL N/A 08/24/2017   Procedure: ESOPHAGOGASTRODUODENOSCOPY (EGD) WITH PROPOFOL;  Surgeon: Wilford Corner, MD;  [facility];  Service: Endoscopy;  Laterality: N/A;  . EYE SURGERY    . FISTULA SUPERFICIALIZATION Left 02/18/2017   Procedure:  SUPERFICIALIZATION LEFT ARM ARTERIOVENOUS FISTULA;  Surgeon: Serafina Mitchell, MD;  Location: [facility];  Service: Vascular;  Laterality: Left;  . HERNIA REPAIR    . INSERTION OF DIALYSIS CATHETER  01/05/2012   Procedure: INSERTION OF DIALYSIS CATHETER;  Surgeon: Conrad , MD;  Location: [facility];  Service: Vascular;  Laterality: N/A;  Right Internal Jugular Placement  . INTRAOCULAR PROSTHESES INSERTION Right 2013   "fell; knocked my eye outl" (08/23/2012)  . JOINT REPLACEMENT    . LAPAROTOMY  12/18/2010   Procedure: EXPLORATORY LAPAROTOMY;  Surgeon: Rolm Bookbinder, MD;  Location: [facility];  Service: General;  Laterality: N/A;  Abdominal Seroma Evacuation  . LEFT HEART CATHETERIZATION WITH CORONARY ANGIOGRAM N/A 08/25/2012   Procedure: LEFT HEART CATHETERIZATION WITH CORONARY ANGIOGRAM;  Surgeon: Birdie Riddle, MD;  Location: [facility] CATH LAB;  Service: Cardiovascular;  Laterality: N/A;  . LUMBAR  [place] SURGERY  1970'-80's   X 3  . TOTAL KNEE ARTHROPLASTY Left 1980's  . VAGINAL HYSTERECTOMY  1970's  . VENTRAL HERNIA REPAIR  2012-2013   component separation, repair with biologic; "had 3 surgeries to fix it" (08/23/2012)  . WOUND DEBRIDEMENT  03/20/2011   Procedure: DEBRIDEMENT ABDOMINAL WOUND;  Surgeon: Rolm Bookbinder, MD;  Location: [facility];  Service: General;  Laterality: N/A;  debridement abdominal wall, placement of wound vac     OB History   No obstetric history on file.      Home Medications    Prior to Admission medications   Medication Sig Start Date End Date Taking? Authorizing Provider  albuterol (PROVENTIL) (2.5 MG/3ML) 0.083% nebulizer solution Take 3 mLs (2.5 mg total) by nebulization every 4 (four) hours as needed for shortness of breath. 06/22/18   Dixie Dials, MD  allopurinol (ZYLOPRIM) 100 MG tablet Take 0.5 tablets (50 mg total) by mouth daily. 06/23/18   Dixie Dials, MD  ALPRAZolam Duanne Moron) 0.25 MG tablet Take 1 tablet (0.25 mg total) by mouth at bedtime. 06/22/18   Dixie Dials,  MD  aspirin EC 81 MG tablet Take 81 mg by mouth daily.      [provider]  atorvastatin (LIPITOR) 40 MG tablet Take 40 mg by mouth daily.    [provider]  budesonide-formoterol (SYMBICORT) 160-4.5 MCG/ACT inhaler Inhale 2 puffs into the lungs 2 (two) times daily. 06/07/18   Lauraine Rinne, NP  cinacalcet (SENSIPAR) 30 MG tablet Take 3 tablets (90 mg total) by mouth daily with breakfast. 06/23/18   Dixie Dials, MD  colchicine 0.6 MG tablet Take 0.6 mg by mouth daily as needed (at onset of gout flare).    [provider]  doxercalciferol (HECTOROL) 4 MCG/2ML injection Inject 1.5 mLs (3 mcg total) into the vein Every Tuesday,Thursday,and Saturday with dialysis. 06/22/18   Dixie Dials, MD  hydrALAZINE (APRESOLINE) 25 MG tablet Take 1 tablet (25 mg total) by mouth 2 (two) times daily. 06/22/18   Dixie Dials, MD  insulin aspart (NOVOLOG) 100 UNIT/ML injection Inject 0-9 Units into the skin 3 (three) times daily with meals. 06/22/18   Dixie Dials, MD  midodrine (PROAMATINE) 10 MG tablet Take 10-20 mg by mouth See admin instructions. Take 1 tablet by mouth as directed with meals Take 1 tablet by mouth just before dialysis and another 1 tablet in the middle of dialysis 04/26/17   [provider]  Multiple Vitamin (MULTIVITAMIN WITH MINERALS) TABS tablet Take 1 tablet by mouth daily. 06/22/18   Dixie Dials, MD  sevelamer carbonate (RENVELA) 800 MG tablet Take 4 tablets (3,200 mg total) by mouth 3 (three) times daily with meals. 06/22/18   Dixie Dials, MD  Tiotropium Bromide Monohydrate (SPIRIVA RESPIMAT) 2.5 MCG/ACT AERS Inhale 2 puffs into the lungs daily. 06/07/18   Lauraine Rinne, NP    Family History Family History  Problem Relation Age of Onset  . Hypertension Mother   . Cancer Brother        spine  . Hypertension Father   . Cancer Sister        brain  . Hypertension Maternal Grandmother   . Anesthesia problems Neg Hx     Social History Social History    Tobacco Use  . Smoking status: Former Smoker    Packs/day: 0.50    Years: 20.00    Pack years: 10.00    Types: Cigarettes    Quit date: 12/16/1980    Years since  quitting: 37.8  . Smokeless tobacco: Never Used  Substance Use Topics  . Alcohol use: No  . Drug use: No     Allergies   Lyrica [pregabalin]   Review of Systems Review of Systems  Constitutional: Negative for chills and fever.  HENT: Negative for sore throat.   Eyes: Negative for redness.  Respiratory: Negative for cough and shortness of breath.   Cardiovascular: Negative for chest pain.  Gastrointestinal: Negative for abdominal pain and vomiting.  Genitourinary: Negative for dysuria.  Musculoskeletal: Positive for back pain. Negative for neck pain.  Skin: Negative for rash.  Neurological: Negative for weakness, numbness and headaches.  Hematological: Does not bruise/bleed easily.  Psychiatric/Behavioral: Negative for confusion.     Physical Exam Updated Vital Signs BP 136/64 (BP Location: Right Arm)   Pulse 68   Temp 97.6 F (36.4 C) (Oral)   Resp 20   SpO2 98%   Physical Exam Vitals signs and nursing note reviewed.  Constitutional:      Appearance: Normal appearance. She is well-developed.  HENT:     Head: Atraumatic.     Nose: Nose normal.     Mouth/Throat:     Mouth: Mucous membranes are moist.  Eyes:     General: No scleral icterus.    Conjunctiva/sclera: Conjunctivae normal.  Neck:     Musculoskeletal: Normal range of motion and neck supple. No neck rigidity or muscular tenderness.     Trachea: No tracheal deviation.  Cardiovascular:     Rate and Rhythm: Normal rate and regular rhythm.     Pulses: Normal pulses.     Heart sounds: Normal heart sounds. No murmur. No friction rub. No gallop.   Pulmonary:     Effort: Pulmonary effort is normal. No respiratory distress.     Breath sounds: Normal breath sounds.  Abdominal:     General: Bowel sounds are normal. There is no distension.      Palpations: Abdomen is soft. There is no mass.     Tenderness: There is no abdominal tenderness. There is no guarding.  Genitourinary:    Comments: No cva tenderness.  Musculoskeletal:        General: No swelling.     Comments: Diffuse lumbar tenderness, otherwise, CTLS spine, non tender, aligned, no step off. Good rom bil hip and knees without pain. Distal pulses palp bil. AV fistula LUE w palp thrill.   Skin:    General: Skin is warm and dry.     Findings: No rash.  Neurological:     Mental Status: She is alert.     Comments: Alert, speech normal. Motor intact bil legs, stre 5/5. sens grossly intact bil.  Psychiatric:        Mood and Affect: Mood normal.      ED Treatments / Results  Labs (all labs ordered are listed, but only abnormal results are displayed) Results for orders placed or performed during the hospital encounter of 10/04/18  CBC  Result Value Ref Range   WBC 6.0 4.0 - 10.5 K/uL   RBC 3.96 3.87 - 5.11 MIL/uL   Hemoglobin 11.6 (L) 12.0 - 15.0 g/dL   HCT 35.8 (L) 36.0 - 46.0 %   MCV 90.4 80.0 - 100.0 fL   MCH 29.3 26.0 - 34.0 pg   MCHC 32.4 30.0 - 36.0 g/dL   RDW 16.8 (H) 11.5 - 15.5 %   Platelets 207 150 - 400 K/uL   nRBC 0.0 0.0 - 0.2 %  Comprehensive metabolic  panel  Result Value Ref Range   Sodium 140 135 - 145 mmol/L   Potassium 5.4 (H) 3.5 - 5.1 mmol/L   Chloride 93 (L) 98 - 111 mmol/L   CO2 28 22 - 32 mmol/L   Glucose, Bld 118 (H) 70 - 99 mg/dL   BUN 62 (H) 8 - 23 mg/dL   Creatinine, Ser 13.11 (H) 0.44 - 1.00 mg/dL   Calcium 9.4 8.9 - 10.3 mg/dL   Total Protein 7.6 6.5 - 8.1 g/dL   Albumin 3.4 (L) 3.5 - 5.0 g/dL   AST 18 15 - 41 U/L   ALT 13 0 - 44 U/L   Alkaline Phosphatase 52 38 - 126 U/L   Total Bilirubin 0.7 0.3 - 1.2 mg/dL   GFR calc non Af Amer 2 (L) >60 mL/min   GFR calc Af Amer 3 (L) >60 mL/min   Anion gap 19 (H) 5 - 15   Dg Lumbar Spine Complete  Result Date: 10/05/2018 CLINICAL DATA:  Low back pain since yesterday morning.  EXAM: LUMBAR SPINE - COMPLETE 4+ VIEW COMPARISON:  Abdominal CT 04/11/2015 FINDINGS: Generalized degenerative endplate spurring with disc narrowing mainly at L3-4 and L4-5. Endplate irregularity is chronic and degenerative when correlated with 2017 CT. Osteopenic appearance. Multifocal atherosclerotic calcification. IMPRESSION: 1. No acute finding. 2. Disc degeneration, especially at L3-4 and L4-5, with similar appearance to 2017 CT. Electronically Signed   By: Monte Fantasia M.D.   On: 10/05/2018 10:55    ED ECG REPORT   Date: 10/18/2018  Rate: 76  Rhythm: sinus arrhythmia  QRS Axis: right  Intervals: QT prolonged  ST/T Wave abnormalities: nonspecific ST/T changes  Conduction Disutrbances:none  Narrative Interpretation:   Old EKG Reviewed: changes noted  I have personally reviewed the EKG tracing Radiology Dg Lumbar Spine Complete  Result Date: 10/05/2018 CLINICAL DATA:  Low back pain since yesterday morning. EXAM: LUMBAR SPINE - COMPLETE 4+ VIEW COMPARISON:  Abdominal CT 04/11/2015 FINDINGS: Generalized degenerative endplate spurring with disc narrowing mainly at L3-4 and L4-5. Endplate irregularity is chronic and degenerative when correlated with 2017 CT. Osteopenic appearance. Multifocal atherosclerotic calcification. IMPRESSION: 1. No acute finding. 2. Disc degeneration, especially at L3-4 and L4-5, with similar appearance to 2017 CT. Electronically Signed   By: Monte Fantasia M.D.   On: 10/05/2018 10:55   Dg Chest Port 1 View  Result Date: 10/05/2018 CLINICAL DATA:  Shortness of breath, dialysis EXAM: PORTABLE CHEST 1 VIEW COMPARISON:  06/20/2018 FINDINGS: Cardiomegaly. Mild, diffuse bilateral interstitial pulmonary opacity. The visualized skeletal structures are unremarkable. IMPRESSION: Cardiomegaly with mild, diffuse bilateral interstitial pulmonary opacity, likely edema. No focal airspace opacity. Electronically Signed   By: Eddie Candle M.D.   On: 10/05/2018 13:12     Procedures Procedures (including critical care time)  Medications Ordered in ED Medications  HYDROmorphone (DILAUDID) injection 1 mg (has no administration in time range)  ondansetron (ZOFRAN) injection 4 mg (has no administration in time range)     Initial Impression / Assessment and Plan / ED Course  I have reviewed the triage vital signs and the nursing notes.  Pertinent labs & imaging results that were available during my care of the patient were reviewed by me and considered in my medical decision making (see chart for details).  Labs sent and imaging ordered, stat. Continuous pulse ox and monitor.   Dilaudid 1 mg iv for pain.  Reviewed nursing notes and prior charts for additional history.   Labs reviewed by me - esrd,  k is elevated. Renal stat consulted re recent missed dialysis, high k, volume overload - they will arrange dialysis, and request medical admission.   Lumbar xrays reviewed by me - deg disc changes lumbar spine. Discussed w pt.   Pain improved but persists w iv dilaudid. In stretcher, flat/supine, pt reasonably comfortable, however is unable to stand or walk due to increased pain in lower back, pain radiates down legs.   Prednisone po. Morphine iv.   Pain unable to ambulate due to severe lumbar pain/sciatica.  Given missed hd, k elev, weakness/sob, nephrology consulted for dialysis - discussed pt, last HD Friday, elev k, sob -  they will arrange dialysis, and indicate medical service to admit.  Hospitalists consulted for admission re esrd/hd, ddd with intractable radicular low back pain/inability to ambulate due to pain, elev k/missed dialysis.   CXR reviewed by me - vascular congestion/edema. Recheck pulse ox 89%. o2 [place].   CRITICAL CARE RE: ESRD with missed dialysis with high K, volume overload/pulm edema, requiring admission/dialysis, radicular low back pain/severe, gen weakness, uncontrolled htn.  Performed by: Mirna Mires Total critical care time:  40 minutes Critical care time was exclusive of separately billable procedures and treating other patients. Critical care was necessary to treat or prevent imminent or life-threatening deterioration. Critical care was time spent personally by me on the following activities: development of treatment plan with patient and/or surrogate as well as nursing, discussions with consultants, evaluation of patient's response to treatment, examination of patient, obtaining history from patient or surrogate, ordering and performing treatments and interventions, ordering and review of laboratory studies, ordering and review of radiographic studies, pulse oximetry and re-evaluation of patient's condition.       Final Clinical Impressions(s) / ED Diagnoses   Final diagnoses:  None    ED Discharge Orders    None          Lajean Saver, MD 10/18/18 1323

## 2018-10-05 NOTE — ED Notes (Signed)
Dinner tray ordered.

## 2018-10-05 NOTE — ED Notes (Signed)
Admitting provider at bedside.

## 2018-10-05 NOTE — Consult Note (Addendum)
[facility] Renal Consultation Note  Indication for Consultation:  Management of ESRD/hemodialysis; anemia, hypertension/volume and secondary hyperparathyroidism  HPI: [name] is a 73 y.o. femalehistory of  ESRD  Chronic HD  (TTS) DM2, MI, HTN, gout,  presented  SOB a nd lower back pain . She states she had HD Thursday  And Friday last week missed Saturday secondary Aunt's 100 birthday out of town. She also co low Back pain unable to go to op HD today because  of decreased mobility .    Cxr showing Vascular congestion / Pulmonary edema  / k 5.4   She denies fevers, chills, N/V/D , Chest pain       Past Medical History:  Diagnosis Date  . Abdominal abscess 12-17-10   abdominal abscesses x2 ? one at this time  . Anemia   . Arthritis    "all over" (08/23/2012)  . Blind right eye    fell and crushed socket and eye fell out, was replaced at [facility]  . CHF (congestive heart failure) ([place])   . Chronic lower back pain   . Coronary artery disease    normal coronaries by 08/25/12 cath  . Dyspnea   . Dysrhythmia    irregular, "skips beats"  . ESRD (end stage renal disease) on dialysis ([place])    "started 12/2011; [place]; TTS" (08/23/2012  . Exertional shortness of breath   . Eye drainage    "lots; since fall 07/2011" (08/23/2012)  . Gout   . H/O hiatal hernia   . Headache(784.0)    "weekly sometimes; since I fell and hit my head in 07/2011 08/23/2012)  . Heart murmur   . History of blood transfusion    "lots before starting dialysis" (08/23/2012)  . Hypertension    sees Dr. Willey Blade  . Inhalation injury    "worked at [employer]; can't inhale polyurethane or paint, etc" (08/23/2012)  . Myocardial infarction ([place]) 1970's or 1980's  . Neuromuscular disorder ([place])    diabetic neuropathy  . OSA on CPAP   . Staph aureus infection November 2012   . Swelling of both ankles 12-17-10  . Type II diabetes mellitus ([place])    "over 10 years now" (08/23/2012)    Past  Surgical History:  Procedure Laterality Date  . [place] VITRECTOMY WITH 20 GAUGE MVR PORT Left 05/07/2017   Procedure: 25 GAUGE PARS PLANA VITRECTOMY WITH ENDOLASER LEFT EYE;  Surgeon: Bernarda Caffey, MD;  Location: [facility];  Service: Ophthalmology;  Laterality: Left;  . APPENDECTOMY    . AV FISTULA PLACEMENT  09/30/2011   Procedure: ARTERIOVENOUS (AV) FISTULA CREATION;  Surgeon: Conrad Algodones, MD;  Location: [place];  Service: Vascular;  Laterality: Left;  BRACHIAL-CEPHALIC  . BACK SURGERY    . BASCILIC VEIN TRANSPOSITION Left 11/10/2011   Left arm   . [place] TRANSPOSITION  01/28/2012   Procedure: BASCILIC VEIN TRANSPOSITION;  Surgeon: Conrad Ridgeway, MD;  Location: [facility];  Service: Vascular;  Laterality: Left;  Second Stage   . BIOPSY  08/24/2017   Procedure: BIOPSY;  Surgeon: Wilford Corner, MD;  Location: [facility];  Service: Endoscopy;;  . CARDIAC CATHETERIZATION  1980's  . CATARACT EXTRACTION W/ INTRAOCULAR LENS IMPLANT Bilateral 1990's  . COLONOSCOPY WITH PROPOFOL N/A 08/24/2017   Procedure: COLONOSCOPY WITH PROPOFOL;  Surgeon: Wilford Corner, MD;  [facility];  Service: Endoscopy;  Laterality: N/A;  . CORONARY ANGIOGRAPHY N/A 04/07/2017   Procedure: CORONARY ANGIOGRAPHY (CATH LAB);  Surgeon: Dixie Dials, MD;  Location: [place] [facility];  Service: Cardiovascular;  Laterality: N/A;  . DILATION AND CURETTAGE OF UTERUS  1960's  . ESOPHAGOGASTRODUODENOSCOPY (EGD) WITH PROPOFOL N/A 08/24/2017   Procedure: ESOPHAGOGASTRODUODENOSCOPY (EGD) WITH PROPOFOL;  Surgeon: Wilford Corner, MD;  [facility];  Service: Endoscopy;  Laterality: N/A;  . EYE SURGERY    . FISTULA SUPERFICIALIZATION Left 02/18/2017   Procedure: SUPERFICIALIZATION LEFT ARM ARTERIOVENOUS FISTULA;  Surgeon: Serafina Mitchell, MD;  Location: [place];  Service: Vascular;  Laterality: Left;  . HERNIA REPAIR    . INSERTION OF DIALYSIS CATHETER  01/05/2012   Procedure: INSERTION OF DIALYSIS  CATHETER;  Surgeon: Conrad Coyne Center, MD;  Location: [place];  Service: Vascular;  Laterality: N/A;  Right Internal Jugular Placement  . INTRAOCULAR PROSTHESES INSERTION Right 2013   "fell; knocked my eye outl" (08/23/2012)  . JOINT REPLACEMENT    . LAPAROTOMY  12/18/2010   Procedure: EXPLORATORY LAPAROTOMY;  Surgeon: Rolm Bookbinder, MD;  [facility];  Service: General;  Laterality: N/A;  Abdominal Seroma Evacuation  . LEFT HEART CATHETERIZATION WITH CORONARY ANGIOGRAM N/A 08/25/2012   Procedure: LEFT HEART CATHETERIZATION WITH CORONARY ANGIOGRAM;  Surgeon: Birdie Riddle, MD;  Location: [facility];  Service: Cardiovascular;  Laterality: N/A;  . LUMBAR DISC SURGERY  1970'-80's   X 3  . TOTAL KNEE ARTHROPLASTY Left 1980's  . VAGINAL HYSTERECTOMY  1970's  . VENTRAL HERNIA REPAIR  2012-2013   component separation, repair with biologic; "had 3 surgeries to fix it" (08/23/2012)  . WOUND DEBRIDEMENT  03/20/2011   Procedure: DEBRIDEMENT ABDOMINAL WOUND;  Surgeon: Rolm Bookbinder, MD;  Location: [place];  Service: General;  Laterality: N/A;  debridement abdominal wall, placement of wound vac      Family History  Problem Relation Age of Onset  . Hypertension Mother   . Cancer Brother        spine  . Hypertension Father   . Cancer Sister        brain  . Hypertension Maternal Grandmother   . Anesthesia problems Neg Hx       reports that she quit smoking about 37 years ago. Her smoking use included cigarettes. She has a 10.00 pack-year smoking history. She has never used smokeless tobacco. She reports that she does not drink alcohol or use drugs.   Allergies  Allergen Reactions  . Lyrica [Pregabalin] Other (See Comments)    Hallucinations    Prior to Admission medications   Medication Sig Start Date End Date Taking? Authorizing Provider  albuterol (PROVENTIL) (2.5 MG/3ML) 0.083% nebulizer solution Take 3 mLs (2.5 mg total) by nebulization every 4 (four) hours as needed for shortness of  breath. 06/22/18   Dixie Dials, MD  allopurinol (ZYLOPRIM) 100 MG tablet Take 0.5 tablets (50 mg total) by mouth daily. 06/23/18   Dixie Dials, MD  ALPRAZolam Duanne Moron) 0.25 MG tablet Take 1 tablet (0.25 mg total) by mouth at bedtime. 06/22/18   Dixie Dials, MD  aspirin EC 81 MG tablet Take 81 mg by mouth daily.      [provider]  atorvastatin (LIPITOR) 40 MG tablet Take 40 mg by mouth daily.    [provider]  budesonide-formoterol (SYMBICORT) 160-4.5 MCG/ACT inhaler Inhale 2 puffs into the lungs 2 (two) times daily. 06/07/18   Lauraine Rinne, NP  cinacalcet (SENSIPAR) 30 MG tablet Take 3 tablets (90 mg total) by mouth daily with breakfast. 06/23/18   Dixie Dials, MD  colchicine  0.6 MG tablet Take 0.6 mg by mouth daily as needed (at onset of gout flare).    [provider]  doxercalciferol (HECTOROL) 4 MCG/2ML injection Inject 1.5 mLs (3 mcg total) into the vein Every Tuesday,Thursday,and Saturday with dialysis. 06/22/18   Dixie Dials, MD  hydrALAZINE (APRESOLINE) 25 MG tablet Take 1 tablet (25 mg total) by mouth 2 (two) times daily. 06/22/18   Dixie Dials, MD  insulin aspart (NOVOLOG) 100 UNIT/ML injection Inject 0-9 Units into the skin 3 (three) times daily with meals. 06/22/18   Dixie Dials, MD  midodrine (PROAMATINE) 10 MG tablet Take 10-20 mg by mouth See admin instructions. Take 1 tablet by mouth as directed with meals Take 1 tablet by mouth just before dialysis and another 1 tablet in the middle of dialysis 04/26/17   [provider]  Multiple Vitamin (MULTIVITAMIN WITH MINERALS) TABS tablet Take 1 tablet by mouth daily. 06/22/18   Dixie Dials, MD  sevelamer carbonate (RENVELA) 800 MG tablet Take 4 tablets (3,200 mg total) by mouth 3 (three) times daily with meals. 06/22/18   Dixie Dials, MD  Tiotropium Bromide Monohydrate (SPIRIVA RESPIMAT) 2.5 MCG/ACT AERS Inhale 2 puffs into the lungs daily. 06/07/18   Lauraine Rinne, NP    Continuous:   Results for  orders placed or performed during the hospital encounter of 10/04/18 (from the past 48 hour(s))  CBC     Status: Abnormal   Collection Time: 10/05/18 11:04 AM  Result Value Ref Range   WBC 6.0 4.0 - 10.5 K/uL   RBC 3.96 3.87 - 5.11 MIL/uL   Hemoglobin 11.6 (L) 12.0 - 15.0 g/dL   HCT 35.8 (L) 36.0 - 46.0 %   MCV 90.4 80.0 - 100.0 fL   MCH 29.3 26.0 - 34.0 pg   MCHC 32.4 30.0 - 36.0 g/dL   RDW 16.8 (H) 11.5 - 15.5 %   Platelets 207 150 - 400 K/uL   nRBC 0.0 0.0 - 0.2 %    Comment: Performed at [facility], [address] [place], [place] [address]  Comprehensive metabolic panel     Status: Abnormal   Collection Time: 10/05/18 11:04 AM  Result Value Ref Range   Sodium 140 135 - 145 mmol/L   Potassium 5.4 (H) 3.5 - 5.1 mmol/L   Chloride 93 (L) 98 - 111 mmol/L   CO2 28 22 - 32 mmol/L   Glucose, Bld 118 (H) 70 - 99 mg/dL   BUN 62 (H) 8 - 23 mg/dL   Creatinine, Ser 13.11 (H) 0.44 - 1.00 mg/dL   Calcium 9.4 8.9 - 10.3 mg/dL   Total Protein 7.6 6.5 - 8.1 g/dL   Albumin 3.4 (L) 3.5 - 5.0 g/dL   AST 18 15 - 41 U/L   ALT 13 0 - 44 U/L   Alkaline Phosphatase 52 38 - 126 U/L   Total Bilirubin 0.7 0.3 - 1.2 mg/dL   GFR calc non Af Amer 2 (L) >60 mL/min   GFR calc Af Amer 3 (L) >60 mL/min   Anion gap 19 (H) 5 - 15    Comment: Performed at [facility], [place] [address], [place], [place] [address]    ROS: see hpi   Physical Exam: Vitals:   10/05/18 1119 10/05/18 1349  BP:  137/67  Pulse: 70 66  Resp:  17  Temp:    SpO2: 97% 97%      94% 92% 98% 93%   General= alert, Obese  female  In ER ,visibly SOB on exam On ROOM AIR  HEENT =scera anicteric, throat clear  No jvd or bruits Lungs =Chest some rales / labored breathing ON ROOM AIR Card= RRR no MRG  Abd obese  soft ntnd no mass or ascites +bs Ext= 1+ pretib edema, no wounds  Neuro I=s alert, Ox 3 , moves all extrem. Dialysis Access =AVF+bruit  Dialysis Orders: Center: [facility] on TTS . EDW 119 kg  HD Bath 2k, 2ca   Time 4hr 30 min  Heparin 13000 . Access LUA AVF   sensipar 60 mg po q hd    Assessment/Plan  1. Volume overload / Pulmonary edema= secondary to missed HD = Plan HD today / Needs Oxygen on in ER rooom until HD , I DW ER  RN 2. ESRD -  normally TTS  k 5.4  3. Hypertension/volume  - as above  Fu wts with uf on hd  4. Anemia  -  hgb 11.6 no op ESA , fu hgb trend  5. Metabolic bone disease -   No vit d on hd / sensipar on hd / renvela binder 6. Lower Back pain with  DJD  L3-4 , L4-5    Ernest Haber, PA-C [place] [phone] 10/05/2018, 1:58 PM   Pt seen, examined and agree w A/P as above.  Kelly Splinter  MD 10/05/2018, 4:47 PM

## 2018-10-05 NOTE — ED Notes (Signed)
Bo, RN given report in dialysis.

## 2018-10-05 NOTE — H&P (Addendum)
History and Physical    [name] [id number] DOB: [birth year] DOA: 10/04/2018  Referring MD/NP/PA: Lajean Saver,  MD PCP: [name], MD  Patient coming from: Home  Chief Complaint: back went out  I have personally briefly reviewed patient's old medical records in [place]   HPI: [name] is a 73 y.o. female with medical history significant of ESRD on HD(T/Th/Sat), hypertension, anemia of chronic disease, CHF, chronic low back pain; who presents with complaints of back pain.  Yesterday, when patient got out of bed she felt as though her back went out.  She initially tried resting without relief of symptoms.    Pain is described as excruciating and we will radiate down both legs. Unable to stand as it seemed to worsen symptoms. Denies having any trauma/fall that onset symptoms, saddle anesthesia, or incontinence.  She last dialyzed on 9/11 because she was going out of town.  Yesterday, she was unable to make her hemodialysis session due to her back pain. Associated symptoms include shortness of breath. Patient reports shortness of breath symptoms have more so chronic in nature, but is not on home oxygen.  She has a pulse oximetry device at home and notes that when she goes to hemodialysis her oxygen saturations usually are in the 80s.     ED Course: Upon admission into the emergency department patient was noted to be afebrile with initial blood pressures elevated up to 190/90, and O2 saturations maintained on 3 L nasal cannula oxygen.  Labs revealed WBC 6, hemoglobin 11.6, potassium 5.4, BUN 62, and creatinine 13.11.  COVID-19 screening pending.  Chest x-ray showing cardiomegaly with mild diffuse bilateral interstitial pulmonary opacities concerning for edema.  X-rays of the lumbar spine showed  degenerative disc disease especially at L3-5.  Nephrology was consulted for need of hemodialysis.  Patient was given 60 mg of prednisone, Tylenol, morphine, and Dilaudid without  relief of symptoms.  TRH called to admit for need of dialysis and back pain.   Review of Systems  Constitutional: Negative for malaise/fatigue and weight loss.  HENT: Negative for congestion and sinus pain.   Eyes: Negative for pain.  Respiratory: Positive for shortness of breath. Negative for cough.   Cardiovascular: Negative for chest pain and leg swelling.  Gastrointestinal: Negative for abdominal pain, nausea and vomiting.  Genitourinary: Negative for dysuria and hematuria.  Musculoskeletal: Positive for back pain. Negative for falls.  Neurological: Negative for loss of consciousness and weakness.    Past Medical History:  Diagnosis Date  . Abdominal abscess 12-17-10   abdominal abscesses x2 ? one at this time  . Anemia   . Arthritis    "all over" (08/23/2012)  . Blind right eye    fell and crushed socket and eye fell out, was replaced at [facility]  . CHF (congestive heart failure) ([place])   . Chronic lower back pain   . Coronary artery disease    normal coronaries by 08/25/12 cath  . Dyspnea   . Dysrhythmia    irregular, "skips beats"  . ESRD (end stage renal disease) on dialysis ([place])    "started 12/2011; [facility]; TTS" (08/23/2012  . Exertional shortness of breath   . Eye drainage    "lots; since fall 07/2011" (08/23/2012)  . Gout   . H/O hiatal hernia   . Headache(784.0)    "weekly sometimes; since I fell and hit my head in 07/2011 08/23/2012)  . Heart murmur   . History of blood transfusion    "  lots before starting dialysis" (08/23/2012)  . Hypertension    sees Dr. Willey Blade  . Inhalation injury    "worked at [employer]; can't inhale polyurethane or paint, etc" (08/23/2012)  . Myocardial infarction ([place]) 1970's or 1980's  . Neuromuscular disorder ([place])    diabetic neuropathy  . OSA on CPAP   . Staph aureus infection November 2012   . Swelling of both ankles 12-17-10  . Type II diabetes mellitus ([place])    "over 10 years now" (08/23/2012)    Past Surgical History:   Procedure Laterality Date  . [place] VITRECTOMY WITH 20 GAUGE MVR PORT Left 05/07/2017   Procedure: 25 GAUGE PARS PLANA VITRECTOMY WITH ENDOLASER LEFT EYE;  Surgeon: Bernarda Caffey, MD;  Location: [place];  Service: Ophthalmology;  Laterality: Left;  . APPENDECTOMY    . AV FISTULA PLACEMENT  09/30/2011   Procedure: ARTERIOVENOUS (AV) FISTULA CREATION;  Surgeon: Conrad Allen, MD;  Location: [place];  Service: Vascular;  Laterality: Left;  BRACHIAL-CEPHALIC  . BACK SURGERY    . BASCILIC VEIN TRANSPOSITION Left 11/10/2011   Left arm   . Trail TRANSPOSITION  01/28/2012   Procedure: BASCILIC VEIN TRANSPOSITION;  Surgeon: Conrad Latah, MD;  Location: [place];  Service: Vascular;  Laterality: Left;  Second Stage   . BIOPSY  08/24/2017   Procedure: BIOPSY;  Surgeon: Wilford Corner, MD;  Location: [facility];  Service: Endoscopy;;  . CARDIAC CATHETERIZATION  1980's  . CATARACT EXTRACTION W/ INTRAOCULAR LENS IMPLANT Bilateral 1990's  . COLONOSCOPY WITH PROPOFOL N/A 08/24/2017   Procedure: COLONOSCOPY WITH PROPOFOL;  Surgeon: Wilford Corner, MD;  [facility];  Service: Endoscopy;  Laterality: N/A;  . CORONARY ANGIOGRAPHY N/A 04/07/2017   Procedure: CORONARY ANGIOGRAPHY (CATH LAB);  Surgeon: Dixie Dials, MD;  Location: [place] CV LAB;  Service: Cardiovascular;  Laterality: N/A;  . DILATION AND CURETTAGE OF UTERUS  1960's  . ESOPHAGOGASTRODUODENOSCOPY (EGD) WITH PROPOFOL N/A 08/24/2017   Procedure: ESOPHAGOGASTRODUODENOSCOPY (EGD) WITH PROPOFOL;  Surgeon: Wilford Corner, MD;  [facility];  Service: Endoscopy;  Laterality: N/A;  . EYE SURGERY    . FISTULA SUPERFICIALIZATION Left 02/18/2017   Procedure: SUPERFICIALIZATION LEFT ARM ARTERIOVENOUS FISTULA;  Surgeon: Serafina Mitchell, MD;  Location: [place];  Service: Vascular;  Laterality: Left;  . HERNIA REPAIR    . INSERTION OF DIALYSIS CATHETER  01/05/2012   Procedure: INSERTION OF DIALYSIS CATHETER;  Surgeon:  Conrad Ida, MD;  Location: [place];  Service: Vascular;  Laterality: N/A;  Right Internal Jugular Placement  . INTRAOCULAR PROSTHESES INSERTION Right 2013   "fell; knocked my eye outl" (08/23/2012)  . JOINT REPLACEMENT    . LAPAROTOMY  12/18/2010   Procedure: EXPLORATORY LAPAROTOMY;  Surgeon: Rolm Bookbinder, MD;  Location: [facility];  Service: General;  Laterality: N/A;  Abdominal Seroma Evacuation  . LEFT HEART CATHETERIZATION WITH CORONARY ANGIOGRAM N/A 08/25/2012   Procedure: LEFT HEART CATHETERIZATION WITH CORONARY ANGIOGRAM;  Surgeon: Birdie Riddle, MD;  Location: [place] C[facility];  Service: Cardiovascular;  Laterality: N/A;  . LUMBAR DISC SURGERY  1970'-80's   X 3  . TOTAL KNEE ARTHROPLASTY Left 1980's  . VAGINAL HYSTERECTOMY  1970's  . VENTRAL HERNIA REPAIR  2012-2013   component separation, repair with biologic; "had 3 surgeries to fix it" (08/23/2012)  . WOUND DEBRIDEMENT  03/20/2011   Procedure: DEBRIDEMENT ABDOMINAL WOUND;  Surgeon: Rolm Bookbinder, MD;  Location: [place];  Service: General;  Laterality: N/A;  debridement  abdominal wall, placement of wound vac     reports that she quit smoking about 37 years ago. Her smoking use included cigarettes. She has a 10.00 pack-year smoking history. She has never used smokeless tobacco. She reports that she does not drink alcohol or use drugs.  Allergies  Allergen Reactions  . Lyrica [Pregabalin] Other (See Comments)    Hallucinations    Family History  Problem Relation Age of Onset  . Hypertension Mother   . Cancer Brother        spine  . Hypertension Father   . Cancer Sister        brain  . Hypertension Maternal Grandmother   . Anesthesia problems Neg Hx     Prior to Admission medications   Medication Sig Start Date End Date Taking? Authorizing Provider  albuterol (PROVENTIL) (2.5 MG/3ML) 0.083% nebulizer solution Take 3 mLs (2.5 mg total) by nebulization every 4 (four) hours as needed for shortness of breath. 06/22/18   Dixie Dials, MD  allopurinol (ZYLOPRIM) 100 MG tablet Take 0.5 tablets (50 mg total) by mouth daily. 06/23/18   Dixie Dials, MD  ALPRAZolam Duanne Moron) 0.25 MG tablet Take 1 tablet (0.25 mg total) by mouth at bedtime. 06/22/18   Dixie Dials, MD  aspirin EC 81 MG tablet Take 81 mg by mouth daily.      [provider]  atorvastatin (LIPITOR) 40 MG tablet Take 40 mg by mouth daily.    [provider]  budesonide-formoterol (SYMBICORT) 160-4.5 MCG/ACT inhaler Inhale 2 puffs into the lungs 2 (two) times daily. 06/07/18   Lauraine Rinne, NP  cinacalcet (SENSIPAR) 30 MG tablet Take 3 tablets (90 mg total) by mouth daily with breakfast. 06/23/18   Dixie Dials, MD  colchicine 0.6 MG tablet Take 0.6 mg by mouth daily as needed (at onset of gout flare).    [provider]  doxercalciferol (HECTOROL) 4 MCG/2ML injection Inject 1.5 mLs (3 mcg total) into the vein Every Tuesday,Thursday,and Saturday with dialysis. 06/22/18   Dixie Dials, MD  hydrALAZINE (APRESOLINE) 25 MG tablet Take 1 tablet (25 mg total) by mouth 2 (two) times daily. 06/22/18   Dixie Dials, MD  insulin aspart (NOVOLOG) 100 UNIT/ML injection Inject 0-9 Units into the skin 3 (three) times daily with meals. 06/22/18   Dixie Dials, MD  midodrine (PROAMATINE) 10 MG tablet Take 10-20 mg by mouth See admin instructions. Take 1 tablet by mouth as directed with meals Take 1 tablet by mouth just before dialysis and another 1 tablet in the middle of dialysis 04/26/17   [provider]  Multiple Vitamin (MULTIVITAMIN WITH MINERALS) TABS tablet Take 1 tablet by mouth daily. 06/22/18   Dixie Dials, MD  sevelamer carbonate (RENVELA) 800 MG tablet Take 4 tablets (3,200 mg total) by mouth 3 (three) times daily with meals. 06/22/18   Dixie Dials, MD  Tiotropium Bromide Monohydrate (SPIRIVA RESPIMAT) 2.5 MCG/ACT AERS Inhale 2 puffs into the lungs daily. 06/07/18   Lauraine Rinne, NP    Physical Exam:  Constitutional: Elderly female who  appears to be in some discomfort  Vitals:   10/05/18 0859 10/05/18 1118 10/05/18 1119 10/05/18 1349  BP: 136/64 (!) 126/59  137/67  Pulse: 68 71 70 66  Resp: 20 20  17   Temp: 97.6 F (36.4 C)     TempSrc: Oral     SpO2: 98% 95% 97% 97%   Eyes: Vision loss in the right eye.  Left eye pupil round and reactive  to light. ENMT: Mucous membranes are moist. Posterior pharynx clear of any exudate or lesions.  Neck: normal, supple, no masses, no thyromegaly Respiratory: Normal respiratory effort with positive crackles heard in the mid to lower lung fields.  Patient currently on 3 L nasal cannula oxygen. Cardiovascular: Regular rate and rhythm, no murmurs / rubs / gallops. No extremity edema. 2+ pedal pulses.  Left upper arm fistula present. Abdomen: no tenderness, no masses palpated. No hepatosplenomegaly. Bowel sounds positive.  Musculoskeletal: no clubbing / cyanosis.  Straight leg testing in either leg produces pain at even 15 to 30 degrees. Skin: no rashes, lesions, ulcers. No induration Neurologic: CN 2-12 grossly intact. Sensation intact, DTR normal. Strength 5/5 in all 4.  Psychiatric: Normal judgment and insight. Alert and oriented x 3. Normal mood.     Labs on Admission: I have personally reviewed following labs and imaging studies  CBC: Recent Labs  Lab 10/05/18 1104  WBC 6.0  HGB 11.6*  HCT 35.8*  MCV 90.4  PLT [id number]   Basic Metabolic Panel: Recent Labs  Lab 10/05/18 1104  NA 140  K 5.4*  CL 93*  CO2 28  GLUCOSE 118*  BUN 62*  CREATININE 13.11*  CALCIUM 9.4   GFR: CrCl cannot be calculated (Unknown ideal weight.). Liver Function Tests: Recent Labs  Lab 10/05/18 1104  AST 18  ALT 13  ALKPHOS 52  BILITOT 0.7  PROT 7.6  ALBUMIN 3.4*   No results for input(s): LIPASE, AMYLASE in the last 168 hours. No results for input(s): AMMONIA in the last 168 hours. Coagulation Profile: No results for input(s): INR, PROTIME in the last 168 hours. Cardiac Enzymes: No  results for input(s): CKTOTAL, CKMB, CKMBINDEX, TROPONINI in the last 168 hours. BNP (last 3 results) No results for input(s): PROBNP in the last 8760 hours. HbA1C: No results for input(s): HGBA1C in the last 72 hours. CBG: No results for input(s): GLUCAP in the last 168 hours. Lipid Profile: No results for input(s): CHOL, HDL, LDLCALC, TRIG, CHOLHDL, LDLDIRECT in the last 72 hours. Thyroid Function Tests: No results for input(s): TSH, T4TOTAL, FREET4, T3FREE, THYROIDAB in the last 72 hours. Anemia Panel: No results for input(s): VITAMINB12, FOLATE, FERRITIN, TIBC, IRON, RETICCTPCT in the last 72 hours. Urine analysis:    Component Value Date/Time   COLORURINE YELLOW 07/02/2010 2127   APPEARANCEUR CLOUDY (A) 07/02/2010 2127   LABSPEC 1.011 07/02/2010 2127   PHURINE 6.5 07/02/2010 2127   GLUCOSEU 250 (A) 07/02/2010 2127   HGBUR SMALL (A) 07/02/2010 2127   BILIRUBINUR NEGATIVE 07/02/2010 2127   KETONESUR NEGATIVE 07/02/2010 2127   PROTEINUR >300 (A) 07/02/2010 2127   UROBILINOGEN 0.2 07/02/2010 2127   NITRITE NEGATIVE 07/02/2010 2127   LEUKOCYTESUR LARGE (A) 07/02/2010 2127   Sepsis Labs: No results found for this or any previous visit (from the past 240 hour(s)).   Radiological Exams on Admission: Dg Lumbar Spine Complete  Result Date: 10/05/2018 CLINICAL DATA:  Low back pain since yesterday morning. EXAM: LUMBAR SPINE - COMPLETE 4+ VIEW COMPARISON:  Abdominal CT 04/11/2015 FINDINGS: Generalized degenerative endplate spurring with disc narrowing mainly at L3-4 and L4-5. Endplate irregularity is chronic and degenerative when correlated with 2017 CT. Osteopenic appearance. Multifocal atherosclerotic calcification. IMPRESSION: 1. No acute finding. 2. Disc degeneration, especially at L3-4 and L4-5, with similar appearance to 2017 CT. Electronically Signed   By: Monte Fantasia M.D.   On: 10/05/2018 10:55   Dg Chest Port 1 View  Result Date: 10/05/2018 CLINICAL DATA:  Shortness of  breath, dialysis EXAM: PORTABLE CHEST 1 VIEW COMPARISON:  06/20/2018 FINDINGS: Cardiomegaly. Mild, diffuse bilateral interstitial pulmonary opacity. The visualized skeletal structures are unremarkable. IMPRESSION: Cardiomegaly with mild, diffuse bilateral interstitial pulmonary opacity, likely edema. No focal airspace opacity. Electronically Signed   By: Eddie Candle M.D.   On: 10/05/2018 13:12    Chest x-ray: Independently reviewed.  Cardiomegaly and pulmonary edema  Assessment/Plan ESRD on HD with fluid overload: Acute.  Patient reports last hemodialysis session on 9/11.  Chest x-ray showing cardiomegaly with diffuse bilateral opacities concerning for edema.  Labs reveal potassium 5.4, BUN 62, creatinine 13.11.  Nephrology was consulted for need of hemodialysis. -Admit to a medical telemetry bed -Continuous pulse oximetry with nasal cannula oxygen as needed -Renal and carb modified diet -Continue Sensipar and Renvela for secondary hyperparathyroidism -Appreciate nephrology consultative services, and will follow-up for further recommendation   Lumbar back pain: Acute on chronic.  Patient reports having severe lumbar back pain.  In the ED patient was started on prednisone and given IV pain medications without relief of symptoms. -Check MRI of the lumbar spine -Lidocaine patch to lumbar spine -Oxycodone as needed for pain -PT/OT to eval and treat as patient lives at home alone -May warrant further imaging studies if symptoms do not improve  Hyperkalemia: Acute.  Initial potassium 5.4 on admission.  -HD per nephrology  Dyspnea -Continue pharmacy substitution for inhalers -Patient would benefit from evaluation for need of home oxygen resolution of fluid overload  Diabetes mellitus type 2: Patient appears to be well controlled.  Last hemoglobin A1c 6.2 on 06/17/2018. -Hypoglycemic protocols -CBGs q. before meals and at bedtime with sensitive SSI -Adjust insulin regimen as needed  History  of diastolic heart failure: Patient appears to be fluid overloaded, but secondary to recently missed dialysis.  Her last echocardiogram noted EF of 55 to 60% with impaired relaxation on 06/18/2018.   Anemia of chronic disease: Hemoglobin 11.6 which appears improved from previous baseline of 8-9. -Continue to monitor  Elevated anion gap: Anion gap elevated at 19.  Unclear cause at this time. -Continue to monitor  OSA               -CPAP per RT  DVT prophylaxis: Heparin Code Status: Full Family Communication: No family present at bedside Disposition Plan: Possible discharge home in 1 to 2 days Consults called: Nephrology Admission status: observation   Norval Morton MD [facility] Pager [phone]   If 7PM-7AM, please contact night-coverage [email or web address] Password [facility]  10/05/2018, 2:14 PM

## 2018-10-05 NOTE — ED Notes (Signed)
Tried to walk patient. Patient couldn't get up she stated it hurt to bad

## 2018-10-05 NOTE — ED Notes (Signed)
Patient transported to X-ray 

## 2018-10-05 NOTE — ED Notes (Signed)
Pt transported to dialysis by NT.

## 2018-10-05 NOTE — ED Notes (Signed)
No answer for vitals recheck x1 

## 2018-10-05 NOTE — ED Notes (Signed)
Pt remains at dialysis

## 2018-10-05 NOTE — ED Notes (Signed)
Called dialysis and spoke with RN taking care of this pt. I asked her if I could send the meds for this pt as she left at shift change/report time. After discussion, she gave me tube number [phone] to send the meds to. Meds sent to tube station [phone].

## 2018-10-05 NOTE — ED Notes (Signed)
Patient transported to MRI 

## 2018-10-06 ENCOUNTER — Other Ambulatory Visit: Payer: Self-pay

## 2018-10-06 ENCOUNTER — Encounter (HOSPITAL_COMMUNITY): Payer: Self-pay

## 2018-10-06 DIAGNOSIS — I2510 Atherosclerotic heart disease of native coronary artery without angina pectoris: Secondary | ICD-10-CM | POA: Diagnosis present

## 2018-10-06 DIAGNOSIS — I5033 Acute on chronic diastolic (congestive) heart failure: Secondary | ICD-10-CM | POA: Diagnosis present

## 2018-10-06 DIAGNOSIS — N2581 Secondary hyperparathyroidism of renal origin: Secondary | ICD-10-CM | POA: Diagnosis present

## 2018-10-06 DIAGNOSIS — H5461 Unqualified visual loss, right eye, normal vision left eye: Secondary | ICD-10-CM | POA: Diagnosis present

## 2018-10-06 DIAGNOSIS — E8779 Other fluid overload: Secondary | ICD-10-CM | POA: Diagnosis not present

## 2018-10-06 DIAGNOSIS — G4733 Obstructive sleep apnea (adult) (pediatric): Secondary | ICD-10-CM | POA: Diagnosis not present

## 2018-10-06 DIAGNOSIS — J810 Acute pulmonary edema: Secondary | ICD-10-CM | POA: Diagnosis not present

## 2018-10-06 DIAGNOSIS — E8889 Other specified metabolic disorders: Secondary | ICD-10-CM | POA: Diagnosis present

## 2018-10-06 DIAGNOSIS — M5126 Other intervertebral disc displacement, lumbar region: Secondary | ICD-10-CM | POA: Diagnosis present

## 2018-10-06 DIAGNOSIS — E1169 Type 2 diabetes mellitus with other specified complication: Secondary | ICD-10-CM | POA: Diagnosis present

## 2018-10-06 DIAGNOSIS — E785 Hyperlipidemia, unspecified: Secondary | ICD-10-CM | POA: Diagnosis present

## 2018-10-06 DIAGNOSIS — I5032 Chronic diastolic (congestive) heart failure: Secondary | ICD-10-CM

## 2018-10-06 DIAGNOSIS — G8929 Other chronic pain: Secondary | ICD-10-CM | POA: Diagnosis present

## 2018-10-06 DIAGNOSIS — E1122 Type 2 diabetes mellitus with diabetic chronic kidney disease: Secondary | ICD-10-CM | POA: Diagnosis present

## 2018-10-06 DIAGNOSIS — Z6841 Body Mass Index (BMI) 40.0 and over, adult: Secondary | ICD-10-CM | POA: Diagnosis not present

## 2018-10-06 DIAGNOSIS — D631 Anemia in chronic kidney disease: Secondary | ICD-10-CM | POA: Diagnosis present

## 2018-10-06 DIAGNOSIS — I132 Hypertensive heart and chronic kidney disease with heart failure and with stage 5 chronic kidney disease, or end stage renal disease: Secondary | ICD-10-CM | POA: Diagnosis present

## 2018-10-06 DIAGNOSIS — I9589 Other hypotension: Secondary | ICD-10-CM | POA: Diagnosis present

## 2018-10-06 DIAGNOSIS — M549 Dorsalgia, unspecified: Secondary | ICD-10-CM | POA: Diagnosis present

## 2018-10-06 DIAGNOSIS — J449 Chronic obstructive pulmonary disease, unspecified: Secondary | ICD-10-CM | POA: Diagnosis present

## 2018-10-06 DIAGNOSIS — I252 Old myocardial infarction: Secondary | ICD-10-CM | POA: Diagnosis not present

## 2018-10-06 DIAGNOSIS — Z20828 Contact with and (suspected) exposure to other viral communicable diseases: Secondary | ICD-10-CM | POA: Diagnosis present

## 2018-10-06 DIAGNOSIS — E875 Hyperkalemia: Secondary | ICD-10-CM | POA: Diagnosis present

## 2018-10-06 DIAGNOSIS — Z23 Encounter for immunization: Secondary | ICD-10-CM | POA: Diagnosis present

## 2018-10-06 DIAGNOSIS — M5136 Other intervertebral disc degeneration, lumbar region: Secondary | ICD-10-CM | POA: Diagnosis present

## 2018-10-06 DIAGNOSIS — N186 End stage renal disease: Secondary | ICD-10-CM | POA: Diagnosis present

## 2018-10-06 DIAGNOSIS — M48061 Spinal stenosis, lumbar region without neurogenic claudication: Secondary | ICD-10-CM | POA: Diagnosis present

## 2018-10-06 DIAGNOSIS — J9601 Acute respiratory failure with hypoxia: Secondary | ICD-10-CM | POA: Diagnosis present

## 2018-10-06 LAB — CBC
HCT: 35.1 % — ABNORMAL LOW (ref 36.0–46.0)
Hemoglobin: 11.1 g/dL — ABNORMAL LOW (ref 12.0–15.0)
MCH: 28.7 pg (ref 26.0–34.0)
MCHC: 31.6 g/dL (ref 30.0–36.0)
MCV: 90.7 fL (ref 80.0–100.0)
Platelets: 224 10*3/uL (ref 150–400)
RBC: 3.87 MIL/uL (ref 3.87–5.11)
RDW: 16.7 % — ABNORMAL HIGH (ref 11.5–15.5)
WBC: 5.5 10*3/uL (ref 4.0–10.5)
nRBC: 0 % (ref 0.0–0.2)

## 2018-10-06 LAB — BASIC METABOLIC PANEL
Anion gap: 15 (ref 5–15)
BUN: 32 mg/dL — ABNORMAL HIGH (ref 8–23)
CO2: 27 mmol/L (ref 22–32)
Calcium: 8.7 mg/dL — ABNORMAL LOW (ref 8.9–10.3)
Chloride: 95 mmol/L — ABNORMAL LOW (ref 98–111)
Creatinine, Ser: 8.23 mg/dL — ABNORMAL HIGH (ref 0.44–1.00)
GFR calc Af Amer: 5 mL/min — ABNORMAL LOW (ref >60)
GFR calc non Af Amer: 4 mL/min — ABNORMAL LOW (ref >60)
Glucose, Bld: 141 mg/dL — ABNORMAL HIGH (ref 70–99)
Potassium: 5.2 mmol/L — ABNORMAL HIGH (ref 3.5–5.1)
Sodium: 137 mmol/L (ref 135–145)

## 2018-10-06 LAB — GLUCOSE, CAPILLARY
Glucose-Capillary: 110 mg/dL — ABNORMAL HIGH (ref 70–99)
Glucose-Capillary: 135 mg/dL — ABNORMAL HIGH (ref 70–99)
Glucose-Capillary: 118 mg/dL — ABNORMAL HIGH (ref 70–99)
Glucose-Capillary: 120 mg/dL — ABNORMAL HIGH (ref 70–99)

## 2018-10-06 MED ORDER — INFLUENZA VAC A&B SA ADJ QUAD 0.5 ML IM PRSY
0.5000 mL | PREFILLED_SYRINGE | INTRAMUSCULAR | Status: AC
  Administered 2018-10-07: 0.5 mL via INTRAMUSCULAR
  Filled 2018-10-06: qty 0.5

## 2018-10-06 MED ORDER — CHLORHEXIDINE GLUCONATE CLOTH 2 % EX PADS
6.0000 | MEDICATED_PAD | Freq: Every day | CUTANEOUS | Status: DC
  Administered 2018-10-06 – 2018-10-09 (×5): 6 via TOPICAL

## 2018-10-06 NOTE — Evaluation (Signed)
Physical Therapy Evaluation Patient Details Name: [name] MRN: [id number] DOB: 1945/06/18 Today's Date: 10/06/2018   History of Present Illness  73 yo female with onset of back pain suddenly the day before admission, with no previous history per pt. On O2 only during HD, but concerned she needs otherwise.  Pt is having pulm edema, fluid overload.  PMHx:  HD for ESRD, abd abscess, L4-5 spinal stenosis, L 3-4 stenosis, DM, OSA, CHF, gout, CAD, MI, transfusions, HTN, R eye blind, SOB  Clinical Impression  Pt was seen for mobility and strengthening testing, and her main limitation for any movement is pain.  Have talked with nursing about this concern, and will anticipate her need for follow up therapy with help to manage the inability to get OOB and to work on gait due to pain.  Nursing is medicating pt and asked PT to return when pt is more controlled with pain.  Follow up to see her with another attempt to walk and look at her skills for home living including stairs to enter house.    Follow Up Recommendations SNF;Supervision for mobility/OOB;Supervision/Assistance - 24 hour    Equipment Recommendations  None recommended by PT    Recommendations for Other Services       Precautions / Restrictions Precautions Precautions: Fall;Other (comment) Precaution Comments: blind R eye Restrictions Weight Bearing Restrictions: No      Mobility  Bed Mobility Overal bed mobility: Needs Assistance Bed Mobility: Sidelying to Sit;Sit to Sidelying   Sidelying to sit: Min assist     Sit to sidelying: Mod assist General bed mobility comments: min to support pushing off bed due to pain from sidelying, and mod to lift legs to return to bed even medicated  Transfers Overall transfer level: Needs assistance Equipment used: Rolling walker (2 wheeled);1 person hand held assist Transfers: Sit to/from Stand Sit to Stand: Min guard         General transfer comment: pt has to lean forward on  walker and use walker to support with bed elevated, cannot stand up with more than one hand on the bed  Ambulation/Gait             General Gait Details: due to pain cannot take functional steps, has too much pain and nearly sits uncontrolled  Stairs            Wheelchair Mobility    Modified Rankin (Stroke Patients Only)       Balance Overall balance assessment: Needs assistance Sitting-balance support: Feet supported;Bilateral upper extremity supported Sitting balance-Leahy Scale: Fair     Standing balance support: Bilateral upper extremity supported;During functional activity Standing balance-Leahy Scale: Poor                               Pertinent Vitals/Pain Pain Assessment: 0-10 Pain Score: 10-Worst pain ever Pain Location: lumbar spine esp to stand and step Pain Descriptors / Indicators: Stabbing;Sharp Pain Intervention(s): Repositioned;Premedicated before session;Patient requesting pain meds-RN notified;Other (comment);Limited activity within patient's tolerance;Monitored during session    [place] expects to be discharged to:: Private residence Living Arrangements: Alone Available Help at Discharge: Family;Available PRN/intermittently Type of Home: House Home Access: Stairs to enter Entrance Stairs-Rails: Right Entrance Stairs-Number of Steps: 3 Home Layout: One level Home Equipment: Walker - 2 wheels;Walker - 4 wheels;Cane - single point Additional Comments: has used O2 only in HD, but not at home yet    Prior Function Level  of Independence: Independent with assistive device(s)         Comments: RW most of the time     Hand Dominance   Dominant Hand: Right    Extremity/Trunk Assessment   Upper Extremity Assessment Upper Extremity Assessment: Overall WFL for tasks assessed    Lower Extremity Assessment Lower Extremity Assessment: Generalized weakness(related to pain)    Cervical / Trunk  Assessment Cervical / Trunk Assessment: Other exceptions(back pain with all mobility)  Communication   Communication: No difficulties  Cognition Arousal/Alertness: Awake/alert Behavior During Therapy: Anxious Overall Cognitive Status: Within Functional Limits for tasks assessed                                 General Comments: pt reports being very stressed by her change in pain with standing      General Comments General comments (skin integrity, edema, etc.): pt is up to attempt steps, unable to control for pain in back which per pt does not radiate to legs but impacts standing stability    Exercises     Assessment/Plan    PT Assessment Patient needs continued PT services  PT Problem List Decreased strength;Decreased range of motion;Decreased activity tolerance;Decreased balance;Decreased mobility;Decreased coordination;Cardiopulmonary status limiting activity;Obesity;Pain       PT Treatment Interventions DME instruction;Gait training;Functional mobility training;Therapeutic activities;Balance training;Therapeutic exercise;Stair training;Neuromuscular re-education;Patient/family education    PT Goals (Current goals can be found in the Care Plan section)  Acute Rehab PT Goals Patient Stated Goal: to get her back to hurt less PT Goal Formulation: With patient Time For Goal Achievement: 10/20/18 Potential to Achieve Goals: Good    Frequency Min 3X/week   Barriers to discharge Decreased caregiver support;Inaccessible home environment home alone with stairs to enter house    Co-evaluation               AM-PAC PT "6 Clicks" Mobility  Outcome Measure Help needed turning from your back to your side while in a flat bed without using bedrails?: A Little Help needed moving from lying on your back to sitting on the side of a flat bed without using bedrails?: A Little Help needed moving to and from a bed to a chair (including a wheelchair)?: A Little Help  needed standing up from a chair using your arms (e.g., wheelchair or bedside chair)?: A Little Help needed to walk in hospital room?: Total Help needed climbing 3-5 steps with a railing? : Total 6 Click Score: 14    End of Session Equipment Utilized During Treatment: Gait belt Activity Tolerance: Patient limited by pain Patient left: in bed;with call bell/phone within reach;with bed alarm set Nurse Communication: Mobility status;Patient requests pain meds PT Visit Diagnosis: Unsteadiness on feet (R26.81);Muscle weakness (generalized) (M62.81);Difficulty in walking, not elsewhere classified (R26.2);Pain Pain - Right/Left: (back) Pain - part of body: (back)    Time: 1000-1026 PT Time Calculation (min) (ACUTE ONLY): 26 min   Charges:   PT Evaluation $PT Eval Moderate Complexity: 1 Mod PT Treatments $Therapeutic Activity: 8-22 mins       Ramond Dial 10/06/2018, 12:51 PM   Mee Hives, PT MS Acute Rehab Dept. Number: [place] and [place]

## 2018-10-06 NOTE — Progress Notes (Signed)
Physical Therapy Treatment Patient Details Name: [name] MRN: [id number] DOB: 04/24/1945 Today's Date: 10/06/2018    History of Present Illness 73 yo female with onset of back pain suddenly the day before admission, with no previous history per pt. On O2 only during HD, but concerned she needs otherwise.  Pt is having pulm edema, fluid overload.  PMHx:  HD for ESRD, abd abscess, L4-5 spinal stenosis, L 3-4 stenosis, DM, OSA, CHF, gout, CAD, MI, transfusions, HTN, R eye blind, SOB    PT Comments    At nursing request returned to pt for further therapy attempts to stand and walk, and pt could shift feet but is about to sit uncontrolled from walker on side of bed.  Pt tried mult times to move with steps and is just still at 10 pain level once up.  Factored in her meds and still recommending investigating her options for rehab.  Follow acutely for mobility as tolerated, increased frequency due to OBS status to cover her potential to have difficulty with rehab placement.   Follow Up Recommendations  SNF;Supervision for mobility/OOB;Supervision/Assistance - 24 hour     Equipment Recommendations  None recommended by PT    Recommendations for Other Services       Precautions / Restrictions Precautions Precautions: Fall Precaution Comments: blind R eye Restrictions Weight Bearing Restrictions: No    Mobility  Bed Mobility Overal bed mobility: Needs Assistance Bed Mobility: Sidelying to Sit;Sit to Sidelying   Sidelying to sit: Min guard     Sit to sidelying: Min assist General bed mobility comments: less help to get up but still requiring assist  Transfers Overall transfer level: Needs assistance Equipment used: Rolling walker (2 wheeled);1 person hand held assist Transfers: Sit to/from Stand Sit to Stand: Min guard         General transfer comment: requires bed elevation and alternate hand placement  Ambulation/Gait             General Gait Details: due to  pain cannot take functional steps, has too much pain and nearly sits uncontrolled   Stairs             Wheelchair Mobility    Modified Rankin (Stroke Patients Only)       Balance Overall balance assessment: Needs assistance Sitting-balance support: Feet supported;Bilateral upper extremity supported Sitting balance-Leahy Scale: Good     Standing balance support: Bilateral upper extremity supported;During functional activity Standing balance-Leahy Scale: Poor                              Cognition Arousal/Alertness: Awake/alert Behavior During Therapy: Anxious Overall Cognitive Status: Within Functional Limits for tasks assessed                                 General Comments: pt is complaining about being so disappointed in not being able to walk      Exercises      General Comments General comments (skin integrity, edema, etc.): continues to be unable to take functional steps despite pain that is helping her independence with bed mob      Pertinent Vitals/Pain Pain Assessment: 0-10 Pain Score: 10-Worst pain ever Pain Location: lumbar spine esp to stand and step Pain Descriptors / Indicators: Stabbing;Sharp Pain Intervention(s): Monitored during session;Premedicated before session;Repositioned    Home Living Family/patient expects to be discharged to:: Private residence  Living Arrangements: Alone Available Help at Discharge: Family;Available PRN/intermittently Type of Home: House Home Access: Stairs to enter Entrance Stairs-Rails: Right Home Layout: One level Home Equipment: Walker - 2 wheels;Walker - 4 wheels;Cane - single point Additional Comments: has used O2 only in HD, but not at home yet    Prior Function Level of Independence: Independent with assistive device(s)      Comments: RW most of the time   PT Goals (current goals can now be found in the care plan section) Acute Rehab PT Goals Patient Stated Goal: reduce  pain PT Goal Formulation: With patient Time For Goal Achievement: 10/20/18 Potential to Achieve Goals: Good Progress towards PT goals: Progressing toward goals    Frequency    Min 3X/week(increased frequency in case pt cannot qualify for SNF)      PT Plan Current plan remains appropriate    Co-evaluation              AM-PAC PT "6 Clicks" Mobility   Outcome Measure  Help needed turning from your back to your side while in a flat bed without using bedrails?: A Little Help needed moving from lying on your back to sitting on the side of a flat bed without using bedrails?: A Little Help needed moving to and from a bed to a chair (including a wheelchair)?: A Little Help needed standing up from a chair using your arms (e.g., wheelchair or bedside chair)?: A Little Help needed to walk in hospital room?: Total Help needed climbing 3-5 steps with a railing? : Total 6 Click Score: 14    End of Session Equipment Utilized During Treatment: Gait belt Activity Tolerance: Patient limited by pain Patient left: in bed;with call bell/phone within reach;with bed alarm set Nurse Communication: Mobility status;Patient requests pain meds PT Visit Diagnosis: Unsteadiness on feet (R26.81);Muscle weakness (generalized) (M62.81);Difficulty in walking, not elsewhere classified (R26.2);Pain Pain - Right/Left: (back) Pain - part of body: (back)     Time: [id number] PT Time Calculation (min) (ACUTE ONLY): 32 min  Charges:  $Therapeutic Activity: 23-37 mins                  Ramond Dial 10/06/2018, 1:32 PM   Mee Hives, PT MS Acute Rehab Dept. Number: [facility] [place]

## 2018-10-06 NOTE — Progress Notes (Signed)
[facility] Progress Note  Subjective: 2.5 L off yesterday, feels better.   Vitals:   10/06/18 0010 10/06/18 0727 10/06/18 0813 10/06/18 0823  BP:  128/62    Pulse: 78 66 67 68  Resp: 18 16 18 18   Temp:  97.7 F (36.5 C)    TempSrc:  Oral    SpO2: 98% 97% 96% 96%  Weight:      Height:        Inpatient medications: . ALPRAZolam  0.25 mg Oral QHS  . arformoterol  15 mcg Nebulization BID  . aspirin EC  81 mg Oral Daily  . atorvastatin  40 mg Oral Daily  . budesonide (PULMICORT) nebulizer solution  0.5 mg Nebulization BID  . [START ON 10/07/2018] cinacalcet  60 mg Oral Once per day on Tue Thu Sat  . heparin  5,000 Units Subcutaneous Q8H  . [START ON 10/07/2018] influenza vaccine adjuvanted  0.5 mL Intramuscular Tomorrow-1000  . insulin aspart  0-9 Units Subcutaneous TID WC  . lidocaine  1 patch Transdermal Q24H  . [START ON 10/07/2018] midodrine  10 mg Oral Once per day on Tue Thu Sat  . [START ON 10/07/2018] midodrine  10 mg Oral Q T,Th,Sa-HD  . multivitamin  1 tablet Oral Daily  . sevelamer carbonate  3,200 mg Oral TID WC  . sodium chloride flush  3 mL Intravenous Q12H  . umeclidinium bromide  1 puff Inhalation Daily    acetaminophen **OR** acetaminophen, albuterol, allopurinol, ondansetron **OR** ondansetron (ZOFRAN) IV, oxyCODONE-acetaminophen    Exam: General= alert, Obese female  alert, looks much better HEENT =scera anicteric, throat clear  No jvd or bruits Lungs = no rales on exam today, no wheezing, no distress Card= RRR no MRG  Abd obese  soft ntnd no mass or ascites +bs Ext = trace edema Neuro I=s alert, Ox 3 , moves all extrem. Dialysis Access =AVF+bruit  D[facility]: [facility] .  4.5h   119 kg   2/2 bath  Hep 13000   LUA AVF sensipar 60 mg po q hd    Assessment/Plan: 1. Volume overload / Pulmonary edema= secondary to missed HD and/or lean body wt loss. Today is at dry wt, will challenge w/ HD tomorrow here and try to wean off O2 support.   2. ESRD - usual HD is TTS.  HD tomorrow.  3. Hypertension/volume  - as above  Fu wts with uf on hd  4. Anemia  -  hgb 11.6 no op ESA , fu hgb trend  5. Metabolic bone disease -   No vit d on hd / sensipar on hd / renvela binder 6. Lower Back pain with  DJD  L3-4 , L4-5     Rob Trev Boley 10/06/2018, 11:17 AM  Iron/TIBC/Ferritin/ %Sat    Component Value Date/Time   IRON 185 (H) 02/07/2016 1137   TIBC 272 02/07/2016 1137   FERRITIN 2,583 (H) 02/07/2016 1137   IRONPCTSAT 68 (H) 02/07/2016 1137   Recent Labs  Lab 10/05/18 1104 10/06/18 0525  NA 140 137  K 5.4* 5.2*  CL 93* 95*  CO2 28 27  GLUCOSE 118* 141*  BUN 62* 32*  CREATININE 13.11* 8.23*  CALCIUM 9.4 8.7*  ALBUMIN 3.4*  --    Recent Labs  Lab 10/05/18 1104  AST 18  ALT 13  ALKPHOS 52  BILITOT 0.7  PROT 7.6   Recent Labs  Lab 10/06/18 0525  WBC 5.5  HGB 11.1*  HCT 35.1*  PLT 224

## 2018-10-06 NOTE — Progress Notes (Signed)
PROGRESS NOTE  [name] [id number] DOB: 1945/06/20 DOA: 10/04/2018 PCP: [name], MD  HPI/Recap of past 24 hours: HPI from Dr Dixon Boos [name] is a 73 y.o. female with medical history significant of ESRD on HD(T/Th/Sat), hypertension, anemia of chronic disease, CHF, chronic low back pain; who presents with complaints of back pain.  Yesterday, when patient got out of bed she felt as though her back went out.  She initially tried resting without relief of symptoms. Pain is described as excruciating and we will radiate down both legs. Unable to stand as it seemed to worsen symptoms. Denies having any trauma/fall that onset symptoms, saddle anesthesia, or incontinence.  She last dialyzed on 9/11 because she was going out of town, associated symptoms include shortness of breath. She has a pulse oximetry device at home and notes that when she goes to hemodialysis her oxygen saturations usually are in the 80s. In the ED, patient was noted to be afebrile with initial blood pressures elevated up to 190/90, and O2 saturations maintained on 3 L nasal cannula oxygen. Chest x-ray showing cardiomegaly with mild diffuse bilateral interstitial pulmonary opacities concerning for edema.  X-rays of the lumbar spine showed  degenerative disc disease especially at L3-5. Patient was given 60 mg of prednisone, Tylenol, morphine, and Dilaudid without relief of symptoms.  TRH called to admit for need of dialysis and back pain.  Neurology consulted.    Today, patient reports feeling okay, denies any worsening shortness of breath, chest pain, cough, abdominal pain, nausea/vomiting/diarrhea, fever/chills.  Patient still complains of back pain, was tender upon palpation.  PT/OT pending.  Still requiring supplemental oxygen.  Plan for HD tomorrow.  Assessment/Plan: Active Problems:   End stage renal disease (HCC)   OSA    Type II diabetes mellitus with renal manifestations (HCC)   Chronic diastolic CHF  (congestive heart failure) (HCC)   Fluid overload   Acute pulmonary edema likely 2/2 missed HD/ESRD Improving Nephrology on board, status post HD on 10/05/2018, plan for HD on 10/07/2018 Continue supplemental oxygen, plan to taper off May benefit from evaluation of home O2, status post HD Further management per nephrology  Acute on chronic back pain Ongoing Reports history of previous back surgeries MRI lumbar spine showed L4-L5 multifactorial spinal stenosis that could cause neurocompression on either both sides.  Broad-based disc herniation in combination with facet and ligamentous hypertrophy, L3-L4 mild bilateral lateral recess stenosis because of bulging of the disc Pain management, PT/OT May consider neurosurgery  Hyperkalemia Nephrology on board, on HD Daily BMP  Diabetes mellitus type 2 Last A1c 6.2 on 05/2018 Accu-Cheks, SSI, hypoglycemic protocols  History of diastolic CHF Last echo on 05/2018 showed EF of 55 to 60% with impaired relaxation Fluid management per HD  OSA CPAP  Morbid obesity Lifestyle modification advised          Malnutrition Type:      Malnutrition Characteristics:      Nutrition Interventions:       Estimated body mass index is 42.49 kg/m as calculated from the following:   Height as of this encounter: 5\' 6"  (1.676 m).   Weight as of this encounter: 119.4 kg.     Code Status: Full  Family Communication: None at bedside  Disposition Plan: To be determined   Consultants:  Nephrology  Procedures:  None  Antimicrobials:  None  DVT prophylaxis: Heparin   Objective: Vitals:   10/06/18 0010 10/06/18 0727 10/06/18 0813 10/06/18 0823  BP:  128/62    Pulse: 78 66 67 68  Resp: 18 16 18 18   Temp:  97.7 F (36.5 C)    TempSrc:  Oral    SpO2: 98% 97% 96% 96%  Weight:      Height:        Intake/Output Summary (Last 24 hours) at 10/06/2018 1232 Last data filed at 10/06/2018 0900 Gross per 24 hour  Intake 120  ml  Output 2500 ml  Net -2380 ml   Filed Weights   10/05/18 2336  Weight: 119.4 kg    Exam:  General: NAD   Cardiovascular: S1, S2 present  Respiratory:  Diminished breath sounds bilaterally  Abdomen: Soft, nontender, nondistended, bowel sounds present  Musculoskeletal: Trace bilateral pedal edema noted  Skin:  Dry  Psychiatry: Normal mood   Data Reviewed: CBC: Recent Labs  Lab 10/05/18 1104 10/06/18 0525  WBC 6.0 5.5  HGB 11.6* 11.1*  HCT 35.8* 35.1*  MCV 90.4 90.7  PLT 207 [id number]   Basic Metabolic Panel: Recent Labs  Lab 10/05/18 1104 10/06/18 0525  NA 140 137  K 5.4* 5.2*  CL 93* 95*  CO2 28 27  GLUCOSE 118* 141*  BUN 62* 32*  CREATININE 13.11* 8.23*  CALCIUM 9.4 8.7*   GFR: Estimated Creatinine Clearance: 7.9 mL/min (A) (by C-G formula based on SCr of 8.23 mg/dL (H)). Liver Function Tests: Recent Labs  Lab 10/05/18 1104  AST 18  ALT 13  ALKPHOS 52  BILITOT 0.7  PROT 7.6  ALBUMIN 3.4*   No results for input(s): LIPASE, AMYLASE in the last 168 hours. No results for input(s): AMMONIA in the last 168 hours. Coagulation Profile: No results for input(s): INR, PROTIME in the last 168 hours. Cardiac Enzymes: No results for input(s): CKTOTAL, CKMB, CKMBINDEX, TROPONINI in the last 168 hours. BNP (last 3 results) No results for input(s): PROBNP in the last 8760 hours. HbA1C: No results for input(s): HGBA1C in the last 72 hours. CBG: Recent Labs  Lab 10/06/18 0726 10/06/18 1158  GLUCAP 110* 135*   Lipid Profile: No results for input(s): CHOL, HDL, LDLCALC, TRIG, CHOLHDL, LDLDIRECT in the last 72 hours. Thyroid Function Tests: No results for input(s): TSH, T4TOTAL, FREET4, T3FREE, THYROIDAB in the last 72 hours. Anemia Panel: No results for input(s): VITAMINB12, FOLATE, FERRITIN, TIBC, IRON, RETICCTPCT in the last 72 hours. Urine analysis:    Component Value Date/Time   COLORURINE YELLOW 07/02/2010 2127   APPEARANCEUR CLOUDY (A)  07/02/2010 2127   LABSPEC 1.011 07/02/2010 2127   PHURINE 6.5 07/02/2010 2127   GLUCOSEU 250 (A) 07/02/2010 2127   HGBUR SMALL (A) 07/02/2010 2127   BILIRUBINUR NEGATIVE 07/02/2010 2127   KETONESUR NEGATIVE 07/02/2010 2127   PROTEINUR >300 (A) 07/02/2010 2127   UROBILINOGEN 0.2 07/02/2010 2127   NITRITE NEGATIVE 07/02/2010 2127   LEUKOCYTESUR LARGE (A) 07/02/2010 2127   Sepsis Labs: @LABRCNTIP (procalcitonin:4,lacticidven:4)  ) Recent Results (from the past 240 hour(s))  SARS Coronavirus 2 [facility] order, Performed in [facility] hospital lab) Nasopharyngeal Nasopharyngeal Swab     Status: None   Collection Time: 10/05/18  1:52 PM   Specimen: Nasopharyngeal Swab  Result Value Ref Range Status   SARS Coronavirus 2 NEGATIVE NEGATIVE Final    Comment: (NOTE) If result is NEGATIVE SARS-CoV-2 target nucleic acids are NOT DETECTED. The SARS-CoV-2 RNA is generally detectable in upper and lower  respiratory specimens during the acute phase of infection. The lowest  concentration of SARS-CoV-2 viral copies this assay can detect is 250  copies / mL. A negative result does not preclude SARS-CoV-2 infection  and should not be used as the sole basis for treatment or other  patient management decisions.  A negative result may occur with  improper specimen collection / handling, submission of specimen other  than nasopharyngeal swab, presence of viral mutation(s) within the  areas targeted by this assay, and inadequate number of viral copies  (<250 copies / mL). A negative result must be combined with clinical  observations, patient history, and epidemiological information. If result is POSITIVE SARS-CoV-2 target nucleic acids are DETECTED. The SARS-CoV-2 RNA is generally detectable in upper and lower  respiratory specimens dur ing the acute phase of infection.  Positive  results are indicative of active infection with SARS-CoV-2.  Clinical  correlation with patient history and other  diagnostic information is  necessary to determine patient infection status.  Positive results do  not rule out bacterial infection or co-infection with other viruses. If result is PRESUMPTIVE POSTIVE SARS-CoV-2 nucleic acids MAY BE PRESENT.   A presumptive positive result was obtained on the submitted specimen  and confirmed on repeat testing.  While 2019 novel coronavirus  (SARS-CoV-2) nucleic acids may be present in the submitted sample  additional confirmatory testing may be necessary for epidemiological  and / or clinical management purposes  to differentiate between  SARS-CoV-2 and other Sarbecovirus currently known to infect humans.  If clinically indicated additional testing with an alternate test  methodology [phone]) is advised. The SARS-CoV-2 RNA is generally  detectable in upper and lower respiratory sp ecimens during the acute  phase of infection. The expected result is Negative. Fact Sheet for Patients:  StrictlyIdeas.no Fact Sheet for Healthcare Providers: BankingDealers.co.za This test is not yet approved or cleared by the [place] FDA and has been authorized for detection and/or diagnosis of SARS-CoV-2 by FDA under an Emergency Use Authorization (EUA).  This EUA will remain in effect (meaning this test can be used) for the duration of the COVID-19 declaration under Section 564(b)(1) of the Act, 21 U.S.C. section 360bbb-3(b)(1), unless the authorization is terminated or revoked sooner. Performed at [facility], [place] [address] [place], [place] [address]       Studies: Mr Lumbar Spine Wo Contrast  Result Date: 10/05/2018 CLINICAL DATA:  Low back pain not responding to conservative therapy. Dialysis patient. EXAM: MRI LUMBAR SPINE WITHOUT CONTRAST TECHNIQUE: Multiplanar, multisequence MR imaging of the lumbar spine was performed. No intravenous contrast was administered. COMPARISON:  Radiography same day  FINDINGS: Segmentation:  5 lumbar type vertebral bodies. Alignment:  Straightening of the normal lumbar lordosis. Vertebrae: No fracture or malignant-appearing bone lesion. Benign appearing fatty changes within the lower thoracic and lumbar spine. Discogenic endplate edema at [id number] and L4-5 that could contribute to back pain. Conus medullaris and cauda equina: Conus extends to the T12-L1 level. Conus and cauda equina appear normal. Paraspinal and other soft tissues: Fatty atrophy of the paraspinal musculature. Disc levels: Noncompressive disc bulges at L2-3 and above. L3-4: Moderate bulging of the disc. Mild facet and ligamentous prominence. Mild stenosis of both lateral recesses without definite neural compression. Some discogenic endplate marrow changes which could be associated with back pain. L4-5: Disc degeneration with broad-based herniation. Facet and ligamentous hypertrophy. Stenosis of both lateral recesses that could compress either or both L5 nerves. Discogenic endplate changes that could be associated with back pain. L5-S1: Mild bulging of the disc. Mild facet arthritis. No compressive stenosis. IMPRESSION: L4-5: Multifactorial spinal stenosis that could  cause neural compression on either or both sides. Broad-based disc herniation in combination with facet and ligamentous hypertrophy. L3-4: Mild bilateral lateral recess stenosis because of bulging of the disc and mild facet hypertrophy. No definite neural compression. Discogenic endplate marrow changes at L3-4 and L4-5 which could be associated with back pain. Electronically Signed   By: Nelson Chimes M.D.   On: 10/05/2018 17:42   Dg Chest Port 1 View  Result Date: 10/05/2018 CLINICAL DATA:  Shortness of breath, dialysis EXAM: PORTABLE CHEST 1 VIEW COMPARISON:  06/20/2018 FINDINGS: Cardiomegaly. Mild, diffuse bilateral interstitial pulmonary opacity. The visualized skeletal structures are unremarkable. IMPRESSION: Cardiomegaly with mild, diffuse  bilateral interstitial pulmonary opacity, likely edema. No focal airspace opacity. Electronically Signed   By: Eddie Candle M.D.   On: 10/05/2018 13:12    Scheduled Meds: . ALPRAZolam  0.25 mg Oral QHS  . arformoterol  15 mcg Nebulization BID  . aspirin EC  81 mg Oral Daily  . atorvastatin  40 mg Oral Daily  . budesonide (PULMICORT) nebulizer solution  0.5 mg Nebulization BID  . Chlorhexidine Gluconate Cloth  6 each Topical Q0600  . [START ON 10/07/2018] cinacalcet  60 mg Oral Once per day on Tue Thu Sat  . heparin  5,000 Units Subcutaneous Q8H  . [START ON 10/07/2018] influenza vaccine adjuvanted  0.5 mL Intramuscular Tomorrow-1000  . insulin aspart  0-9 Units Subcutaneous TID WC  . lidocaine  1 patch Transdermal Q24H  . [START ON 10/07/2018] midodrine  10 mg Oral Once per day on Tue Thu Sat  . [START ON 10/07/2018] midodrine  10 mg Oral Q T,Th,Sa-HD  . multivitamin  1 tablet Oral Daily  . sevelamer carbonate  3,200 mg Oral TID WC  . sodium chloride flush  3 mL Intravenous Q12H  . umeclidinium bromide  1 puff Inhalation Daily    Continuous Infusions:   LOS: 0 days     Alma Friendly, MD [facility]  If 7PM-7AM, please contact night-coverage [email or web address] 10/06/2018, 12:32 PM

## 2018-10-07 DIAGNOSIS — J810 Acute pulmonary edema: Secondary | ICD-10-CM

## 2018-10-07 DIAGNOSIS — J9601 Acute respiratory failure with hypoxia: Secondary | ICD-10-CM

## 2018-10-07 LAB — BASIC METABOLIC PANEL
Anion gap: 17 — ABNORMAL HIGH (ref 5–15)
BUN: 53 mg/dL — ABNORMAL HIGH (ref 8–23)
CO2: 29 mmol/L (ref 22–32)
Calcium: 9.1 mg/dL (ref 8.9–10.3)
Chloride: 91 mmol/L — ABNORMAL LOW (ref 98–111)
Creatinine, Ser: 10.68 mg/dL — ABNORMAL HIGH (ref 0.44–1.00)
GFR calc Af Amer: 4 mL/min — ABNORMAL LOW (ref >60)
GFR calc non Af Amer: 3 mL/min — ABNORMAL LOW (ref >60)
Glucose, Bld: 107 mg/dL — ABNORMAL HIGH (ref 70–99)
Potassium: 4.9 mmol/L (ref 3.5–5.1)
Sodium: 137 mmol/L (ref 135–145)

## 2018-10-07 LAB — CBC WITH DIFFERENTIAL/PLATELET
Abs Immature Granulocytes: 0.02 10*3/uL (ref 0.00–0.07)
Basophils Absolute: 0 10*3/uL (ref 0.0–0.1)
Basophils Relative: 0 %
Eosinophils Absolute: 0.3 10*3/uL (ref 0.0–0.5)
Eosinophils Relative: 4 %
HCT: 34.1 % — ABNORMAL LOW (ref 36.0–46.0)
Hemoglobin: 11.3 g/dL — ABNORMAL LOW (ref 12.0–15.0)
Immature Granulocytes: 0 %
Lymphocytes Relative: 35 %
Lymphs Abs: 2.4 10*3/uL (ref 0.7–4.0)
MCH: 30.1 pg (ref 26.0–34.0)
MCHC: 33.1 g/dL (ref 30.0–36.0)
MCV: 90.9 fL (ref 80.0–100.0)
Monocytes Absolute: 0.7 10*3/uL (ref 0.1–1.0)
Monocytes Relative: 10 %
Neutro Abs: 3.5 10*3/uL (ref 1.7–7.7)
Neutrophils Relative %: 51 %
Platelets: 212 10*3/uL (ref 150–400)
RBC: 3.75 MIL/uL — ABNORMAL LOW (ref 3.87–5.11)
RDW: 16.7 % — ABNORMAL HIGH (ref 11.5–15.5)
WBC: 6.8 10*3/uL (ref 4.0–10.5)
nRBC: 0 % (ref 0.0–0.2)

## 2018-10-07 LAB — GLUCOSE, CAPILLARY
Glucose-Capillary: 156 mg/dL — ABNORMAL HIGH (ref 70–99)
Glucose-Capillary: 92 mg/dL (ref 70–99)
Glucose-Capillary: 169 mg/dL — ABNORMAL HIGH (ref 70–99)
Glucose-Capillary: 149 mg/dL — ABNORMAL HIGH (ref 70–99)
Glucose-Capillary: 179 mg/dL — ABNORMAL HIGH (ref 70–99)

## 2018-10-07 MED ORDER — MIDODRINE HCL 5 MG PO TABS
ORAL_TABLET | ORAL | Status: AC
  Filled 2018-10-07: qty 2

## 2018-10-07 MED ORDER — CINACALCET HCL 30 MG PO TABS
ORAL_TABLET | ORAL | Status: AC
  Filled 2018-10-07: qty 1

## 2018-10-07 MED ORDER — HEPARIN SODIUM (PORCINE) 1000 UNIT/ML DIALYSIS
13000.0000 [IU] | INTRAMUSCULAR | Status: DC | PRN
  Administered 2018-10-07: 08:00:00 13000 [IU] via INTRAVENOUS_CENTRAL

## 2018-10-07 MED ORDER — HEPARIN SODIUM (PORCINE) 1000 UNIT/ML IJ SOLN
INTRAMUSCULAR | Status: AC
  Administered 2018-10-07: 13000 [IU] via INTRAVENOUS_CENTRAL
  Filled 2018-10-07: qty 1

## 2018-10-07 NOTE — Progress Notes (Addendum)
PROGRESS NOTE    [name]  [id number] DOB: 1945/08/11 DOA: 10/04/2018 PCP: [name], MD    Brief Narrative:  73 year old female who presented with severe back pain.  Patient does have significant past medical history for end-stage renal disease on hemodialysis, TTS S, hypertension, anemia chronic kidney disease, diastolic heart failure and chronic low back pain.  Patient developed acute back pain while trying to get out of her bed.  The pain was severe in intensity, radiated to her lower extremities and provoking difficulty ambulation.  Due to her back pain she was unable to go to hemodialysis.  Her prior hemodialysis was [place] September 11, 1 day prior to her routine day because she went out of town.  By the time of admission had passed 4 days without hemodialysis.  In the emergency department blood pressure was 190/90, T1, respirate 20, oxygen saturation 97% on supplemental oxygen per nasal cannula.  Her lungs had rales bilaterally, heart S1-S2 present and rhythmic, abdomen soft, no lower extremity edema. Sodium 140, potassium 5.4, chloride 93, bicarb 28, glucose BUN 62, creatinine 13.1.  White count 6.0, hemoglobin 9.6, hematocrit 35.8, platelets 107.  SARS COVID-19 was negative.  Her chest radiograph had cardiomegaly, bilateral interstitial infiltrates with cephalization of the vasculature.  EKG 76 bpm, left axis deviation, prolonged QTC 539, sinus rhythm with PACs, no ST segment or T wave changes.  Patient was admitted to the hospital with a working diagnosis of acute hypoxic respiratory failure due to pulmonary edema due to volume overload related to end-stage renal disease.  She had further work-up for her back pain with a spine MRI discogenic endplate marrow changes at the L3-L4, and L4-L5  Assessment & Plan:   Active Problems:   End stage renal disease (HCC)   OSA    Type II diabetes mellitus with renal manifestations (HCC)   Chronic diastolic CHF (congestive heart  failure) (HCC)   Fluid overload   1. Acute hypoxic respiratory failure due to volume overload related pulmonary edema, hyperkalemia, in the setting of ESRD on HD. Patient with 4 days without renal replacement therapy as outpatient. This am had HD with improvement of her symptoms, but not yet back to baseline. K is 4,9 and serum bicarbonate at 29. Will follow with renal panel in am, and nephrology recommendations. Continue sensipar and renvela.   2. Diastolic heart failure with acute decompensation. Continue volume control with ultrafiltration. Blood pressure monitoring. On midodrine on HD to prevent hypotension.   3. T2DM with dyslipidemia. Continue glucose cover and monitoring with insulin sliding scale. Patient is tolerating po well. Continue with statin.   4. Obesity. Calculated BMI is 42,9.   5.  Acute on chronic back pain.  Persistent back pain, controlled with analgesics, lumbar spine MRI with L4-L5 spinal stenosis, broad-based disc herniation.  Will continue physical therapy evaluation.  DVT prophylaxis: heparin   Code Status: full Family Communication: no family at the bedside  Disposition Plan/ discharge barriers: pending clinical improvement.   Body mass index is 42.91 kg/m. Malnutrition Type:      Malnutrition Characteristics:      Nutrition Interventions:     RN Pressure Injury Documentation:     Consultants:   Nephrology   Procedures:     Antimicrobials:       Subjective: Patient is sp HD this am, her dyspnea is improved but not yet back to baseline, no chest pain, no cough, no nausea or vomiting.   Objective: Vitals:   10/07/18 1030  10/07/18 1100 10/07/18 1101 10/07/18 1217  BP: 129/62  (!) 89/44 (!) 86/50  Pulse: 62 75 98 67  Resp: (!) 21 17 16    Temp:    98 F (36.7 C)  TempSrc:    Oral  SpO2: 100% 97%  100%  Weight:      Height:        Intake/Output Summary (Last 24 hours) at 10/07/2018 1306 Last data filed at 10/07/2018 1103 Gross  per 24 hour  Intake -  Output 2995 ml  Net -2995 ml   Filed Weights   10/05/18 2336 10/07/18 0725  Weight: 119.4 kg 120.6 kg    Examination:   General: Not in pain or dyspnea, deconditioned  Neurology: Awake and alert, non focal  E ENT: mild pallor, no icterus, oral mucosa moist Cardiovascular: No JVD. S1-S2 present, rhythmic, no gallops, rubs, or murmurs. + non pitting lower extremity edema. Pulmonary: positive breath sounds bilaterally, no wheezing, no rhonchi,scatered rales. Gastrointestinal. Abdome with no organomegaly, non tender, no rebound or guarding Skin. No rashes Musculoskeletal: no joint deformities     Data Reviewed: I have personally reviewed following labs and imaging studies  CBC: Recent Labs  Lab 10/05/18 1104 10/06/18 0525 10/07/18 0433  WBC 6.0 5.5 6.8  NEUTROABS  --   --  3.5  HGB 11.6* 11.1* 11.3*  HCT 35.8* 35.1* 34.1*  MCV 90.4 90.7 90.9  PLT 207 224 [id number]   Basic Metabolic Panel: Recent Labs  Lab 10/05/18 1104 10/06/18 0525 10/07/18 0433  NA 140 137 137  K 5.4* 5.2* 4.9  CL 93* 95* 91*  CO2 28 27 29   GLUCOSE 118* 141* 107*  BUN 62* 32* 53*  CREATININE 13.11* 8.23* 10.68*  CALCIUM 9.4 8.7* 9.1   GFR: Estimated Creatinine Clearance: 6.1 mL/min (A) (by C-G formula based on SCr of 10.68 mg/dL (H)). Liver Function Tests: Recent Labs  Lab 10/05/18 1104  AST 18  ALT 13  ALKPHOS 52  BILITOT 0.7  PROT 7.6  ALBUMIN 3.4*   No results for input(s): LIPASE, AMYLASE in the last 168 hours. No results for input(s): AMMONIA in the last 168 hours. Coagulation Profile: No results for input(s): INR, PROTIME in the last 168 hours. Cardiac Enzymes: No results for input(s): CKTOTAL, CKMB, CKMBINDEX, TROPONINI in the last 168 hours. BNP (last 3 results) No results for input(s): PROBNP in the last 8760 hours. HbA1C: No results for input(s): HGBA1C in the last 72 hours. CBG: Recent Labs  Lab 10/06/18 1158 10/06/18 1617 10/06/18 2103  10/07/18 0622 10/07/18 1215  GLUCAP 135* 118* 120* 92 169*   Lipid Profile: No results for input(s): CHOL, HDL, LDLCALC, TRIG, CHOLHDL, LDLDIRECT in the last 72 hours. Thyroid Function Tests: No results for input(s): TSH, T4TOTAL, FREET4, T3FREE, THYROIDAB in the last 72 hours. Anemia Panel: No results for input(s): VITAMINB12, FOLATE, FERRITIN, TIBC, IRON, RETICCTPCT in the last 72 hours.    Radiology Studies: I have reviewed all of the imaging during this hospital visit personally     Scheduled Meds: . midodrine      . midodrine      . ALPRAZolam  0.25 mg Oral QHS  . arformoterol  15 mcg Nebulization BID  . aspirin EC  81 mg Oral Daily  . atorvastatin  40 mg Oral Daily  . budesonide (PULMICORT) nebulizer solution  0.5 mg Nebulization BID  . Chlorhexidine Gluconate Cloth  6 each Topical Q0600  . cinacalcet  60 mg Oral Once per day on Tue  Thu Sat  . heparin  5,000 Units Subcutaneous Q8H  . influenza vaccine adjuvanted  0.5 mL Intramuscular Tomorrow-1000  . insulin aspart  0-9 Units Subcutaneous TID WC  . lidocaine  1 patch Transdermal Q24H  . midodrine  10 mg Oral Once per day on Tue Thu Sat  . midodrine  10 mg Oral Q T,Th,Sa-HD  . multivitamin  1 tablet Oral Daily  . sevelamer carbonate  3,200 mg Oral TID WC  . sodium chloride flush  3 mL Intravenous Q12H  . umeclidinium bromide  1 puff Inhalation Daily   Continuous Infusions:   LOS: 1 day        Divante Kotch Gerome Apley, MD

## 2018-10-07 NOTE — Progress Notes (Signed)
[facility] Progress Note  Subjective: seen in room, no distress, no new c/o's, feeling better  Vitals:   10/07/18 1030 10/07/18 1100 10/07/18 1101 10/07/18 1217  BP: 129/62  (!) 89/44 (!) 86/50  Pulse: 62 75 98 67  Resp: (!) 21 17 16    Temp:   98.2 F (36.8 C) 98 F (36.7 C)  TempSrc:    Oral  SpO2: 100% 97%  100%  Weight:      Height:        Inpatient medications: . midodrine      . midodrine      . ALPRAZolam  0.25 mg Oral QHS  . arformoterol  15 mcg Nebulization BID  . aspirin EC  81 mg Oral Daily  . atorvastatin  40 mg Oral Daily  . budesonide (PULMICORT) nebulizer solution  0.5 mg Nebulization BID  . Chlorhexidine Gluconate Cloth  6 each Topical Q0600  . cinacalcet  60 mg Oral Once per day on Tue Thu Sat  . heparin  5,000 Units Subcutaneous Q8H  . influenza vaccine adjuvanted  0.5 mL Intramuscular Tomorrow-1000  . insulin aspart  0-9 Units Subcutaneous TID WC  . lidocaine  1 patch Transdermal Q24H  . midodrine  10 mg Oral Once per day on Tue Thu Sat  . midodrine  10 mg Oral Q T,Th,Sa-HD  . multivitamin  1 tablet Oral Daily  . sevelamer carbonate  3,200 mg Oral TID WC  . sodium chloride flush  3 mL Intravenous Q12H  . umeclidinium bromide  1 puff Inhalation Daily    acetaminophen **OR** acetaminophen, albuterol, allopurinol, ondansetron **OR** ondansetron (ZOFRAN) IV, oxyCODONE-acetaminophen    Exam: General= alert, Obese female  alert, stable HEENT =scera anicteric, throat clear  No jvd or bruits Lungs = CTA bilat Card= RRR no MRG  Abd obese  soft ntnd no mass or ascites +bs Ext = trace edema Neuro I=s alert, Ox 3 , moves all extrem. Dialysis Access =AVF+bruit  [facility]: [facility]  4.5h   119 kg   2/2 bath  Hep 13000   LUA AVF sensipar 60 mg po q hd    Assessment/Plan: 1. Volume overload / Pulmonary edema= secondary to missed HD. Today is at dry wt, will challenge w/ HD and try to wean off O2 support.  2. ESRD - usual HD is TTS.  HD  today.  3. HypOtension/volume  - as above  Fu wts with uf on hd . Is on midodrine tid and pre HD.  4. Anemia  -  hgb 11.6 no op ESA , fu hgb trend  5. Metabolic bone disease -   No vit d on hd / sensipar on hd / renvela binder 6. Lower Back pain with  DJD  L3-4 , L4-5     Rob Lewie [name] 10/07/2018, 4:04 PM  Iron/TIBC/Ferritin/ %Sat    Component Value Date/Time   IRON 185 (H) 02/07/2016 1137   TIBC 272 02/07/2016 1137   FERRITIN 2,583 (H) 02/07/2016 1137   IRONPCTSAT 68 (H) 02/07/2016 1137   Recent Labs  Lab 10/05/18 1104  10/07/18 0433  NA 140   < > 137  K 5.4*   < > 4.9  CL 93*   < > 91*  CO2 28   < > 29  GLUCOSE 118*   < > 107*  BUN 62*   < > 53*  CREATININE 13.11*   < > 10.68*  CALCIUM 9.4   < > 9.1  ALBUMIN 3.4*  --   --    < > =  values in this interval not displayed.   Recent Labs  Lab 10/05/18 1104  AST 18  ALT 13  ALKPHOS 52  BILITOT 0.7  PROT 7.6   Recent Labs  Lab 10/07/18 0433  WBC 6.8  HGB 11.3*  HCT 34.1*  PLT 212

## 2018-10-07 NOTE — Progress Notes (Signed)
   10/07/18 0800  OT Visit Information  Last OT Received On 10/07/18  Assistance Needed +2 ( 2 for tendency to sit with a single step)  Reason Eval/Treat Not Completed Patient at procedure or test/ unavailable (HD)  History of Present Illness 73 yo female with onset of back pain suddenly the day before admission, with no previous history per pt. On O2 only during HD, but concerned she needs otherwise.  Pt is having pulm edema, fluid overload.  PMHx:  HD for ESRD, abd abscess, L4-5 spinal stenosis, L 3-4 stenosis, DM, OSA, CHF, gout, CAD, MI, transfusions, HTN, R eye blind, SOB    Plan to reattempt later today.   Tyrone Schimke, OT Acute Rehabilitation Services Pager: [phone] Office: [phone]

## 2018-10-08 LAB — GLUCOSE, CAPILLARY
Glucose-Capillary: 82 mg/dL (ref 70–99)
Glucose-Capillary: 133 mg/dL — ABNORMAL HIGH (ref 70–99)
Glucose-Capillary: 131 mg/dL — ABNORMAL HIGH (ref 70–99)
Glucose-Capillary: 173 mg/dL — ABNORMAL HIGH (ref 70–99)

## 2018-10-08 LAB — BASIC METABOLIC PANEL
Anion gap: 13 (ref 5–15)
BUN: 36 mg/dL — ABNORMAL HIGH (ref 8–23)
CO2: 25 mmol/L (ref 22–32)
Calcium: 9.1 mg/dL (ref 8.9–10.3)
Chloride: 96 mmol/L — ABNORMAL LOW (ref 98–111)
Creatinine, Ser: 8.21 mg/dL — ABNORMAL HIGH (ref 0.44–1.00)
GFR calc Af Amer: 5 mL/min — ABNORMAL LOW (ref >60)
GFR calc non Af Amer: 4 mL/min — ABNORMAL LOW (ref >60)
Glucose, Bld: 104 mg/dL — ABNORMAL HIGH (ref 70–99)
Potassium: 4.7 mmol/L (ref 3.5–5.1)
Sodium: 134 mmol/L — ABNORMAL LOW (ref 135–145)

## 2018-10-08 MED ORDER — CHLORHEXIDINE GLUCONATE CLOTH 2 % EX PADS
6.0000 | MEDICATED_PAD | Freq: Every day | CUTANEOUS | Status: DC
  Administered 2018-10-08 – 2018-10-09 (×3): 6 via TOPICAL

## 2018-10-08 MED ORDER — UMECLIDINIUM BROMIDE 62.5 MCG/INH IN AEPB
1.0000 | INHALATION_SPRAY | Freq: Every day | RESPIRATORY_TRACT | Status: DC
  Administered 2018-10-09 – 2018-10-12 (×4): 1 via RESPIRATORY_TRACT
  Filled 2018-10-08 (×2): qty 7

## 2018-10-08 NOTE — Care Management Important Message (Signed)
Important Message  Patient Details  Name: BLANCA SINQUEFIELD MRN: [id number] Date of Birth: 18-Nov-1945   Medicare Important Message Given:  Yes     Shelda Altes 10/08/2018, 3:23 PM

## 2018-10-08 NOTE — Progress Notes (Signed)
Physical Therapy Treatment Patient Details Name: [name] MRN: [id number] DOB: May 05, 1945 Today's Date: 10/08/2018    History of Present Illness 73 yo female with onset of back pain suddenly the day before admission, with no previous history per pt. On O2 only during HD, but concerned she needs otherwise.  Pt is having pulm edema, fluid overload.  PMHx:  HD for ESRD, abd abscess, L4-5 spinal stenosis, L 3-4 stenosis, DM, OSA, CHF, gout, CAD, MI, transfusions, HTN, R eye blind, SOB    PT Comments    Patient progressing this session able to take steps and even walk in hallway, obviously in pain throughout, but pushing herself.  She turned around in hallway to attempt to walk back, but took the chair for remainder as fatigued and too much pain.  VSS throughout.  Continue to recommend SNF level rehab at d/c.   Follow Up Recommendations  SNF;Supervision for mobility/OOB;Supervision/Assistance - 24 hour     Equipment Recommendations  None recommended by PT    Recommendations for Other Services       Precautions / Restrictions Precautions Precautions: Fall Precaution Comments: blind R eye    Mobility  Bed Mobility               General bed mobility comments: up in chair  Transfers Overall transfer level: Needs assistance Equipment used: Rolling walker (2 wheeled) Transfers: Sit to/from Stand Sit to Stand: Min assist         General transfer comment: up from recliner with min A for balance  Ambulation/Gait Ambulation/Gait assistance: Min assist Gait Distance (Feet): 55 Feet Assistive device: Rolling walker (2 wheeled) Gait Pattern/deviations: Step-through pattern;Decreased stride length;Shuffle     General Gait Details: leaning on walker and increased fatigue/pain/difficulty with distance, chair following   Stairs             Wheelchair Mobility    Modified Rankin (Stroke Patients Only)       Balance Overall balance assessment: Needs  assistance Sitting-balance support: Feet supported Sitting balance-Leahy Scale: Good       Standing balance-Leahy Scale: Poor                              Cognition Arousal/Alertness: Awake/alert Behavior During Therapy: WFL for tasks assessed/performed Overall Cognitive Status: Within Functional Limits for tasks assessed                                        Exercises      General Comments General comments (skin integrity, edema, etc.): donned shoes for ambulation      Pertinent Vitals/Pain Pain Score: 7  Pain Location: low back and into legs Pain Descriptors / Indicators: Stabbing;Sharp Pain Intervention(s): Monitored during session;Repositioned    Home Living                      Prior Function            PT Goals (current goals can now be found in the care plan section) Progress towards PT goals: Progressing toward goals    Frequency    Min 3X/week      PT Plan Current plan remains appropriate    Co-evaluation              AM-PAC PT "6 Clicks" Mobility   Outcome Measure  Help needed turning from your back to your side while in a flat bed without using bedrails?: A Little Help needed moving from lying on your back to sitting on the side of a flat bed without using bedrails?: A Little Help needed moving to and from a bed to a chair (including a wheelchair)?: A Little Help needed standing up from a chair using your arms (e.g., wheelchair or bedside chair)?: A Little Help needed to walk in hospital room?: A Little Help needed climbing 3-5 steps with a railing? : Total 6 Click Score: 16    End of Session   Activity Tolerance: Patient limited by pain Patient left: in chair;with call bell/phone within reach   PT Visit Diagnosis: Unsteadiness on feet (R26.81);Difficulty in walking, not elsewhere classified (R26.2);Pain Pain - Right/Left: (back and legs) Pain - part of body: (back and legs)     Time:  [id number] PT Time Calculation (min) (ACUTE ONLY): 14 min  Charges:  $Gait Training: 8-22 mins                     [place] [phone] 10/08/2018    Reginia Naas 10/08/2018, 4:39 PM

## 2018-10-08 NOTE — TOC Initial Note (Signed)
Transition of Care [facility]) - Initial/Assessment Note    Patient Details  Name: [name] MRN: [id number] Date of Birth: 12/26/1945  [facility] CM/SW Contact:    Gelene Mink, [place] Phone Number: 10/08/2018, 4:31 PM  Clinical Narrative:                  CSW met with the patient at bedside. CSW introduced herself, explained her role, and shared the therapy recommendation. The patient is agreeable to rehab. She stated that she has issues walking around and she is concerned about her mobility. The patient has been to [place] and [facility]. She does not want to return to [place]. The patient reported she lives alone but has her daughter. CSW provided the patient with a copy of the CMS SNF List. CSW stated she would provide the patient with bed offers as they became available.   CSW will continue to follow and assist with disposition planning.   Expected Discharge Plan: Skilled Nursing Facility Barriers to Discharge: Insurance Authorization, Continued Medical Work up   Patient Goals and CMS Choice Patient states their goals for this hospitalization and ongoing recovery are:: Pt would like to get stronger CMS Medicare.gov Compare Post Acute Care list provided to:: Patient Choice offered to / list presented to : Patient  Expected Discharge Plan and Services Expected Discharge Plan: [place] In-house Referral: Clinical Social Work Discharge Planning Services: NA Post Acute Care Choice: [place] Living arrangements for the past 2 months: Single Family Home                 DME Arranged: N/A DME Agency: NA       HH Arranged: NA [place] Agency: NA        Prior Living Arrangements/Services Living arrangements for the past 2 months: Single Family Home Lives with:: Self Patient language and need for interpreter reviewed:: No Do you feel safe going back to the place where you live?: Yes      Need for Family Participation in  Patient Care: No (Comment) Care giver support system in place?: Yes (comment) Current home services: Homehealth aide(Pt had an aide then it was discontinued) Criminal Activity/Legal Involvement Pertinent to Current Situation/Hospitalization: No - Comment as needed  Activities of Daily Living Home Assistive Devices/Equipment: None ADL Screening (condition at time of admission) Patient's cognitive ability adequate to safely complete daily activities?: Yes Is the patient deaf or have difficulty hearing?: No Does the patient have difficulty seeing, even when wearing glasses/contacts?: Yes Does the patient have difficulty concentrating, remembering, or making decisions?: No Patient able to express need for assistance with ADLs?: Yes Does the patient have difficulty dressing or bathing?: Yes Independently performs ADLs?: Yes (appropriate for developmental age) Does the patient have difficulty walking or climbing stairs?: Yes(d/t back pain) Weakness of Legs: None Weakness of Arms/Hands: None  Permission Sought/Granted Permission sought to share information with : Case Manager Permission granted to share information with : Yes, Verbal Permission Granted  Share Information with NAME: Fabio Pierce  Permission granted to share info w AGENCY: All SNF  Permission granted to share info w Relationship: Daughter     Emotional Assessment Appearance:: Appears stated age Attitude/Demeanor/Rapport: Engaged Affect (typically observed): Calm Orientation: : Oriented to Self, Oriented to Place, Oriented to  Time, Oriented to Situation Alcohol / Substance Use: Not Applicable Psych Involvement: No (comment)  Admission diagnosis:  Hyperkalemia [E87.5] Bilateral sciatica [M54.31, M54.32] ESRD (end stage renal disease) on dialysis (  [place]) [N18.6, Z99.2] Other fluid overload [E87.79] Intractable back pain [M54.9] Patient Active Problem List   Diagnosis Date Noted  . Fluid overload 10/05/2018  . Shortness of  breath 06/17/2018  . Acute left systolic heart failure ([place]) 06/17/2018  . Eye pain, left 01/25/2018  . Acute bronchitis due to Rhinovirus 01/18/2018  . Respiratory distress, acute 01/18/2018  . Essential hypertension 01/17/2018  . HLD (hyperlipidemia) 01/17/2018  . Chronic diastolic CHF (congestive heart failure) ([place]) 01/16/2018  . Centrilobular emphysema ([place]) 01/16/2018  . Acute bronchitis 01/16/2018  . Heme + stool 08/24/2017  . Iron deficiency anemia, unspecified 08/24/2017  . Chest pain 04/06/2017  . Unstable angina ([place]) 04/02/2017  . Hypoxemia 10/13/2016  . Acute on chronic combined systolic and diastolic CHF (congestive heart failure) ([place]) 10/13/2016  . Anemia due to chronic kidney disease 10/13/2016  . Encephalopathy, metabolic   . Tremor   . Weakness   . Acute hyperkalemia 12/04/2014  . Neurological deficit present 12/04/2014  . Hypercalcemia 12/04/2014  . OSA    . Myocardial infarction ([place])   . Blind right eye   . Coronary artery disease   . CHF (congestive heart failure) ([place])   . ESRD needing dialysis ([place])   . Hyperkalemia 08/05/2014  . Type II diabetes mellitus with renal manifestations ([place]) 01/18/2014  . Arm pain 02/27/2012  . Other complications due to renal dialysis device, implant, and graft 02/13/2012  . Pain in limb 11/21/2011  . Swollen limb 11/21/2011  . Itching 11/21/2011  . End stage renal disease ([place]) 11/07/2011  . Chronic kidney disease, stage IV (severe) ([place]) 10/24/2011  . Chronic kidney disease (CKD), stage IV (severe) (HCC) 08/29/2011   PCP:  [name], MD Pharmacy:   [place], [place] - [address] [place] [address] Phone: [phone] Fax: [phone]  [facility] - [place], [place] - [address] [place], [place] [address] Phone: [phone] Fax: [phone]     Social Determinants of Health (SDOH)  Interventions    Readmission Risk Interventions Readmission Risk Prevention Plan 06/23/2018 06/22/2018 06/21/2018  Transportation Screening - - Complete  PCP or Specialist Appt within 3-5 Days - Complete -  [place] or [place] - - Complete  Social Work Consult for [place] Planning/Counseling - - Complete  Palliative Care Screening - Not Applicable -  Medication Review (RN Care Manager) - - Complete  PCP or Specialist appointment within 3-5 days of discharge Complete - -  [place] or Home Care Consult Complete - -  SW Recovery Care/Counseling Consult Complete - -  Palliative Care Screening Not Applicable - -  Skilled Nursing Facility Complete - -  Some recent data might be hidden

## 2018-10-08 NOTE — Evaluation (Signed)
Occupational Therapy Evaluation Patient Details Name: [name] MRN: [id number] DOB: 1945-02-21 Today's Date: 10/08/2018    History of Present Illness 73 yo female with onset of back pain suddenly the day before admission, with no previous history per pt. On O2 only during HD, but concerned she needs otherwise.  Pt is having pulm edema, fluid overload.  PMHx:  HD for ESRD, abd abscess, L4-5 spinal stenosis, L 3-4 stenosis, DM, OSA, CHF, gout, CAD, MI, transfusions, HTN, R eye blind, SOB   Clinical Impression   Pt admitted with the above diagnoses and presents with below problem list. Pt will benefit from continued acute OT to address the below listed deficits and maximize independence with basic ADLs prior to d/c to venue below. PTA pt was mod I with ADLs. Pt is currently min guard to min A with LB ADLs and functional transfers/mobility. Pt able to ambulate to/from bathroom this session. Extra time and effort, min A to steady.      Follow Up Recommendations  SNF    Equipment Recommendations  Other (comment)(defer to next venue)    Recommendations for Other Services       Precautions / Restrictions Precautions Precautions: Fall Precaution Comments: blind R eye Restrictions Weight Bearing Restrictions: No      Mobility Bed Mobility Overal bed mobility: Needs Assistance Bed Mobility: Supine to Sit     Supine to sit: Min guard;HOB elevated;Min assist     General bed mobility comments: assist to steady during powerup  Transfers Overall transfer level: Needs assistance Equipment used: Rolling walker (2 wheeled) Transfers: Sit to/from Stand Sit to Stand: Min guard;Min assist         General transfer comment: min guard from elevated seat height. Min A to steady from regular seat height    Balance Overall balance assessment: Needs assistance Sitting-balance support: Feet supported;Bilateral upper extremity supported Sitting balance-Leahy Scale: Good      Standing balance support: Bilateral upper extremity supported;During functional activity Standing balance-Leahy Scale: Poor                             ADL either performed or assessed with clinical judgement   ADL Overall ADL's : Needs assistance/impaired Eating/Feeding: Set up;Sitting   Grooming: Min guard;Minimal assistance;Standing Grooming Details (indicate cue type and reason): mostly min guard, occasional min A to steady during dynamic tasks Upper Body Bathing: Set up;Sitting   Lower Body Bathing: Moderate assistance;Sit to/from stand   Upper Body Dressing : Set up;Sitting   Lower Body Dressing: Moderate assistance;Sit to/from stand   Toilet Transfer: Minimal assistance;Ambulation;RW   Toileting- Water quality scientist and Hygiene: Min guard;Sit to/from stand;Sitting/lateral lean   Tub/ Shower Transfer: Min guard;Minimal assistance;Ambulation;Rolling walker;3 in 1   Functional mobility during ADLs: Min guard;Minimal assistance;Rolling walker General ADL Comments: Pt completed toilet transfer, pericare, grooming tasks standing at sink. Mostly min guard but occasional min A to steady due to shooting back pain.     Vision         Perception     Praxis      Pertinent Vitals/Pain Pain Assessment: 0-10 Pain Score: 7  Pain Location: low back and into legs Pain Descriptors / Indicators: Stabbing;Sharp Pain Intervention(s): Limited activity within patient's tolerance;Monitored during session;Repositioned     Hand Dominance Right   Extremity/Trunk Assessment Upper Extremity Assessment Upper Extremity Assessment: Generalized weakness   Lower Extremity Assessment Lower Extremity Assessment: Defer to PT evaluation   Cervical /  Trunk Assessment Cervical / Trunk Assessment: Other exceptions(back pain with all mobility)   Communication Communication Communication: No difficulties   Cognition Arousal/Alertness: Awake/alert Behavior During Therapy: WFL  for tasks assessed/performed Overall Cognitive Status: Within Functional Limits for tasks assessed                                     General Comments       Exercises     Shoulder Instructions      Home Living Family/patient expects to be discharged to:: Private residence Living Arrangements: Alone Available Help at Discharge: Family;Available PRN/intermittently Type of Home: House Home Access: Stairs to enter [employer] of Steps: 3 Entrance Stairs-Rails: Right Home Layout: One level     Bathroom Shower/Tub: Occupational psychologist: Standard Bathroom Accessibility: Yes   Home Equipment: Environmental consultant - 2 wheels;Walker - 4 wheels;Cane - single point          Prior Functioning/Environment Level of Independence: Independent with assistive device(s)        Comments: RW most of the time        OT Problem List: Decreased strength;Decreased activity tolerance;Impaired balance (sitting and/or standing);Decreased knowledge of use of DME or AE;Decreased knowledge of precautions;Pain      OT Treatment/Interventions: Self-care/ADL training;Energy conservation;DME and/or AE instruction;Therapeutic exercise;Therapeutic activities;Balance training;Patient/family education    OT Goals(Current goals can be found in the care plan section) Acute Rehab OT Goals Patient Stated Goal: reduce pain OT Goal Formulation: With patient Time For Goal Achievement: 10/22/18 Potential to Achieve Goals: Good ADL Goals Pt Will Perform Grooming: with modified independence;standing;sitting Pt Will Perform Lower Body Bathing: with modified independence;sit to/from stand Pt Will Perform Lower Body Dressing: with modified independence;sit to/from stand Pt Will Transfer to Toilet: with modified independence;ambulating Pt Will Perform Toileting - Clothing Manipulation and hygiene: with modified independence;sit to/from stand Pt Will Perform Tub/Shower Transfer: with  supervision;ambulating;shower seat;rolling walker  OT Frequency: Min 2X/week   Barriers to D/C:            Co-evaluation              AM-PAC OT "6 Clicks" Daily Activity     Outcome Measure Help from another person eating meals?: None Help from another person taking care of personal grooming?: None Help from another person toileting, which includes using toliet, bedpan, or urinal?: A Little Help from another person bathing (including washing, rinsing, drying)?: A Little Help from another person to put on and taking off regular upper body clothing?: None Help from another person to put on and taking off regular lower body clothing?: A Little 6 Click Score: 21   End of Session Equipment Utilized During Treatment: Gait belt;Rolling walker  Activity Tolerance: Patient limited by pain;Patient tolerated treatment well Patient left: in chair;with call bell/phone within reach  OT Visit Diagnosis: Unsteadiness on feet (R26.81);Pain;Other abnormalities of gait and mobility (R26.89);Muscle weakness (generalized) (M62.81)                Time: [id number] OT Time Calculation (min): 21 min Charges:  OT General Charges $OT Visit: 1 Visit OT Evaluation $OT Eval Low Complexity: [place], OT Acute Rehabilitation Services Pager: [phone] Office: [phone]   Hortencia Pilar 10/08/2018, 1:06 PM

## 2018-10-08 NOTE — Progress Notes (Signed)
[facility] Progress Note  Subjective: seen in room. Working w/ PT, dispo not clear  Vitals:   10/08/18 0743 10/08/18 0744 10/08/18 0745 10/08/18 0822  BP:    (!) 97/49  Pulse:    71  Resp:      Temp:    (!) 97.5 F (36.4 C)  TempSrc:    Oral  SpO2: 95% 95% 95% 97%  Weight:      Height:        Inpatient medications: . ALPRAZolam  0.25 mg Oral QHS  . arformoterol  15 mcg Nebulization BID  . aspirin EC  81 mg Oral Daily  . atorvastatin  40 mg Oral Daily  . budesonide (PULMICORT) nebulizer solution  0.5 mg Nebulization BID  . Chlorhexidine Gluconate Cloth  6 each Topical Q0600  . Chlorhexidine Gluconate Cloth  6 each Topical Q0600  . cinacalcet  60 mg Oral Once per day on Tue Thu Sat  . heparin  5,000 Units Subcutaneous Q8H  . insulin aspart  0-9 Units Subcutaneous TID WC  . lidocaine  1 patch Transdermal Q24H  . midodrine  10 mg Oral Once per day on Tue Thu Sat  . midodrine  10 mg Oral Q T,Th,Sa-HD  . multivitamin  1 tablet Oral Daily  . sevelamer carbonate  3,200 mg Oral TID WC  . sodium chloride flush  3 mL Intravenous Q12H  . [START ON 10/09/2018] umeclidinium bromide  1 puff Inhalation Daily    acetaminophen **OR** acetaminophen, albuterol, allopurinol, ondansetron **OR** ondansetron (ZOFRAN) IV, oxyCODONE-acetaminophen    Exam: General= alert, Obese female  alert, stable HEENT =scera anicteric, throat clear  No jvd or bruits Lungs = CTA bilat Card= RRR no MRG  Abd obese  soft ntnd no mass or ascites +bs Ext = trace edema Neuro I=s alert, Ox 3 , moves all extrem. Dialysis Access =AVF+bruit  Dialysis: [facility]  4.5h   119 kg   2/2 bath  Hep 13000   LUA AVF sensipar 60 mg po q hd    Assessment/Plan: 1. Volume overload / Pulmonary edema= secondary to missed HD. Resolved, at dry wt. 5.5 L off w/ HD here in 2 sessions.  2. ESRD - usual HD is TTS.  HD tomorrow.  3. HypOtension/volume  - as above  Fu wts with uf on hd . Is on midodrine tid and  pre HD.  4. Anemia  -  hgb 11.6 no op ESA , fu hgb trend  5. Metabolic bone disease -   No vit d on hd / sensipar on hd / renvela binder 6. Lower Back pain with  DJD  L3-4 , L4-5  7. Dispo - working w/ PT, dispo not clear yet    Rob Osmar Howton 10/08/2018, 12:29 PM  Iron/TIBC/Ferritin/ %Sat    Component Value Date/Time   IRON 185 (H) 02/07/2016 1137   TIBC 272 02/07/2016 1137   FERRITIN 2,583 (H) 02/07/2016 1137   IRONPCTSAT 68 (H) 02/07/2016 1137   Recent Labs  Lab 10/05/18 1104  10/08/18 0421  NA 140   < > 134*  K 5.4*   < > 4.7  CL 93*   < > 96*  CO2 28   < > 25  GLUCOSE 118*   < > 104*  BUN 62*   < > 36*  CREATININE 13.11*   < > 8.21*  CALCIUM 9.4   < > 9.1  ALBUMIN 3.4*  --   --    < > =  values in this interval not displayed.   Recent Labs  Lab 10/05/18 1104  AST 18  ALT 13  ALKPHOS 52  BILITOT 0.7  PROT 7.6   Recent Labs  Lab 10/07/18 0433  WBC 6.8  HGB 11.3*  HCT 34.1*  PLT 212

## 2018-10-08 NOTE — Progress Notes (Signed)
PROGRESS NOTE  [name]  DOB: [birth year]  PCP: [name], MD [id number]  DOA: 10/04/2018  LOS: 2 days   Brief narrative: 73 year old female who presented with severe back pain.  Patient does have significant past medical history for end-stage renal disease on hemodialysis, TTS S, hypertension, anemia chronic kidney disease, diastolic heart failure and chronic low back pain.  Patient developed acute back pain while trying to get out of her bed.  The pain was severe in intensity, radiated to her lower extremities and provoking difficulty ambulation.  Due to her back pain she was unable to go to hemodialysis.  Her prior hemodialysis was [place] September 11, 1 day prior to her routine day because she went out of town.  By the time of admission had passed 4 days without hemodialysis.  In the emergency department blood pressure was 190/90, T1, respirate 20, oxygen saturation 97% on supplemental oxygen per nasal cannula.  Her lungs had rales bilaterally, heart S1-S2 present and rhythmic, abdomen soft, no lower extremity edema. Sodium 140, potassium 5.4, chloride 93, bicarb 28, glucose BUN 62, creatinine 13.1.  White count 6.0, hemoglobin 9.6, hematocrit 35.8, platelets 107.  SARS COVID-19 was negative.  Her chest radiograph had cardiomegaly, bilateral interstitial infiltrates with cephalization of the vasculature.  EKG 76 bpm, left axis deviation, prolonged QTC 539, sinus rhythm with PACs, no ST segment or T wave changes.  Patient was admitted to the hospital with a working diagnosis of acute hypoxic respiratory failure due to pulmonary edema due to volume overload related to end-stage renal disease.  She had further work-up for her back pain with a spine MRI discogenic endplate marrow changes at the L3-L4, and L4-L5  Subjective: Patient was seen and examined this morning.  Female.  Sitting up in chair.  Not in distress.  States that she lives alone and is really scared to go back to home  at discharge.  Assessment/Plan:  1. Acute hypoxic respiratory failure due to volume overload related pulmonary edema, hyperkalemia, in the setting of ESRD on HD. Patient with 4 days without renal replacement therapy as outpatient.  Last hemodialysis yesterday on 9/17 nephrology recommendation appreciated.  Continue sensipar and renvela.  Patient stated to me this morning that she lives by herself and does not want to go back to home.  She is willing to stay in a rehab/nursing home.  2. Diastolic heart failure with acute decompensation. Continue volume control with ultrafiltration. Blood pressure monitoring. On midodrine on HD to prevent hypotension.   3. T2DM with dyslipidemia. Continue glucose cover and monitoring with insulin sliding scale. Patient is tolerating po well. Continue with statin.   4.  Morbid. Calculated BMI is 42.9.   5.  Acute on chronic back pain.  Persistent back pain, controlled with analgesics, lumbar spine MRI with L4-L5 spinal stenosis, broad-based disc herniation.  Will continue physical therapy evaluation.  Mobility: Encourage ambulation.  Working with PT DVT prophylaxis:  Heparin Code Status:   Code Status: Full Code  Family Communication:  Expected Discharge:  Anticipate discharge to SNF in 1 to 2 days.  Consultants:  Nephrology  Procedures:    Antimicrobials: Anti-infectives (From admission, onward)   None      Diet Order            Diet renal/carb modified with fluid restriction Diet-HS Snack? Nothing; Fluid restriction: 1200 mL Fluid; Room service appropriate? Yes; Fluid consistency: Thin  Diet effective now  Infusions:    Scheduled Meds: . ALPRAZolam  0.25 mg Oral QHS  . arformoterol  15 mcg Nebulization BID  . aspirin EC  81 mg Oral Daily  . atorvastatin  40 mg Oral Daily  . budesonide (PULMICORT) nebulizer solution  0.5 mg Nebulization BID  . Chlorhexidine Gluconate Cloth  6 each Topical Q0600  . Chlorhexidine  Gluconate Cloth  6 each Topical Q0600  . cinacalcet  60 mg Oral Once per day on Tue Thu Sat  . heparin  5,000 Units Subcutaneous Q8H  . insulin aspart  0-9 Units Subcutaneous TID WC  . lidocaine  1 patch Transdermal Q24H  . midodrine  10 mg Oral Once per day on Tue Thu Sat  . midodrine  10 mg Oral Q T,Th,Sa-HD  . multivitamin  1 tablet Oral Daily  . sevelamer carbonate  3,200 mg Oral TID WC  . sodium chloride flush  3 mL Intravenous Q12H  . [START ON 10/09/2018] umeclidinium bromide  1 puff Inhalation Daily    PRN meds: acetaminophen **OR** acetaminophen, albuterol, allopurinol, ondansetron **OR** ondansetron (ZOFRAN) IV, oxyCODONE-acetaminophen   Objective: Vitals:   10/08/18 0745 10/08/18 0822  BP:  (!) 97/49  Pulse:  71  Resp:    Temp:  (!) 97.5 F (36.4 C)  SpO2: 95% 97%    Intake/Output Summary (Last 24 hours) at 10/08/2018 1437 Last data filed at 10/08/2018 1200 Gross per 24 hour  Intake 200 ml  Output -  Net 200 ml   Filed Weights   10/05/18 2336 10/07/18 0725  Weight: 119.4 kg 120.6 kg   Weight change:  Body mass index is 42.91 kg/m.   Physical Exam: General exam: Appears calm and comfortable.  Skin: No rashes, lesions or ulcers. HEENT: Atraumatic, normocephalic, supple neck, no obvious bleeding Lungs: Clear to auscultation bilaterally CVS: Regular rate and rhythm, no murmur GI/Abd soft, nontender, nondistended, bowel sound present CNS: Alert, awake monitor x3 Psychiatry: Mood appropriate Extremities: No edema, no calf tenderness  Data Review: I have personally reviewed the laboratory data and studies available.  Recent Labs  Lab 10/05/18 1104 10/06/18 0525 10/07/18 0433  WBC 6.0 5.5 6.8  NEUTROABS  --   --  3.5  HGB 11.6* 11.1* 11.3*  HCT 35.8* 35.1* 34.1*  MCV 90.4 90.7 90.9  PLT 207 224 212   Recent Labs  Lab 10/05/18 1104 10/06/18 0525 10/07/18 0433 10/08/18 0421  NA 140 137 137 134*  K 5.4* 5.2* 4.9 4.7  CL 93* 95* 91* 96*  CO2 28  27 29 25   GLUCOSE 118* 141* 107* 104*  BUN 62* 32* 53* 36*  CREATININE 13.11* 8.23* 10.68* 8.21*  CALCIUM 9.4 8.7* 9.1 9.1    Terrilee Croak, MD  [facility] 10/08/2018

## 2018-10-08 NOTE — Progress Notes (Signed)
Renal Navigator notes recommendation for SNF and spoke with OP HD clinic[facility] regarding patient's seat schedule. Patient is TTS 5:55am. She can be changed to TTS 12:10pm if this will be more accommodating for SNF to transport to HD. Renal Navigator spoke with CSW/L. [place] regarding this and asked for updates regarding disposition and if seat needs to change depending on disposition.   Alphonzo Cruise,   Renal Navigator [phone]

## 2018-10-08 NOTE — NC FL2 (Addendum)
[place] MEDICAID FL2 LEVEL OF CARE SCREENING TOOL     IDENTIFICATION  Patient Name: [name] Birthdate: 1945/10/14 Sex: female Admission Date (Current Location): 10/04/2018  [facility] and [place] Number:  Herbalist and Address:  [facility], [place] [address], [place], [place] [address]      Provider Number: [id number]  Attending Physician Name and Address:  Terrilee Croak, MD  Relative Name and Phone Number:       Current Level of Care: Hospital Recommended Level of Care: [place] Prior Approval Number:    Date Approved/Denied:   PASRR Number: [id number]  Discharge Plan: SNF    Current Diagnoses: Patient Active Problem List   Diagnosis Date Noted  . Fluid overload 10/05/2018  . Shortness of breath 06/17/2018  . Acute left systolic heart failure ([place]) 06/17/2018  . Eye pain, left 01/25/2018  . Acute bronchitis due to Rhinovirus 01/18/2018  . Respiratory distress, acute 01/18/2018  . Essential hypertension 01/17/2018  . HLD (hyperlipidemia) 01/17/2018  . Chronic diastolic CHF (congestive heart failure) ([place]) 01/16/2018  . Centrilobular emphysema ([place]) 01/16/2018  . Acute bronchitis 01/16/2018  . Heme + stool 08/24/2017  . Iron deficiency anemia, unspecified 08/24/2017  . Chest pain 04/06/2017  . Unstable angina ([place]) 04/02/2017  . Hypoxemia 10/13/2016  . Acute on chronic combined systolic and diastolic CHF (congestive heart failure) ([place]) 10/13/2016  . Anemia due to chronic kidney disease 10/13/2016  . Encephalopathy, metabolic   . Tremor   . Weakness   . Acute hyperkalemia 12/04/2014  . Neurological deficit present 12/04/2014  . Hypercalcemia 12/04/2014  . OSA    . Myocardial infarction ([place])   . Blind right eye   . Coronary artery disease   . CHF (congestive heart failure) ([place])   . ESRD needing dialysis ([place])   . Hyperkalemia 08/05/2014  . Type II diabetes mellitus with renal manifestations ([place])  01/18/2014  . Arm pain 02/27/2012  . Other complications due to renal dialysis device, implant, and graft 02/13/2012  . Pain in limb 11/21/2011  . Swollen limb 11/21/2011  . Itching 11/21/2011  . End stage renal disease ([place]) 11/07/2011  . Chronic kidney disease, stage IV (severe) ([place]) 10/24/2011  . Chronic kidney disease (CKD), stage IV (severe) ([place]) 08/29/2011    Orientation RESPIRATION BLADDER Height & Weight     Self, Time, Situation, Place  Normal Continent Weight: 265 lb 14 oz (120.6 kg) Height:  5\' 6"  (167.6 cm)  BEHAVIORAL SYMPTOMS/MOOD NEUROLOGICAL BOWEL NUTRITION STATUS      Continent Diet(see DC summary)  AMBULATORY STATUS COMMUNICATION OF NEEDS Skin   Limited Assist Verbally                         Personal Care Assistance Level of Assistance  Bathing, Dressing, Feeding Bathing Assistance: Limited assistance Feeding assistance: Independent Dressing Assistance: Limited assistance     Functional Limitations Info  Sight, Hearing, Speech Sight Info: Impaired(blind right eye) Hearing Info: Adequate Speech Info: Adequate    SPECIAL CARE FACTORS FREQUENCY  PT (By licensed PT), OT (By licensed OT)     PT Frequency: 5x/wk OT Frequency: 5x/wk            Contractures Contractures Info: Not present    Additional Factors Info  Code Status, Allergies, Psychotropic, Insulin Sliding Scale Code Status Info: Full Allergies Info: Adhesive Tape, Lyrica Pregabalin Psychotropic Info: Xanax 0.25mg  daily at bed Insulin Sliding Scale Info:  0-9 units 3x/day with meals       Current Medications (10/08/2018):  This is the current hospital active medication list Current Facility-Administered Medications  Medication Dose Route Frequency Provider Last Rate Last Dose  . acetaminophen (TYLENOL) tablet 650 mg  650 mg Oral Q6H PRN Fuller Plan A, MD   650 mg at 10/08/18 0955   Or  . acetaminophen (TYLENOL) suppository 650 mg  650 mg Rectal Q6H PRN Tamala Julian, Rondell A, MD       . albuterol (PROVENTIL) (2.5 MG/3ML) 0.083% nebulizer solution 2.5 mg  2.5 mg Nebulization Q6H PRN Smith, Rondell A, MD      . allopurinol (ZYLOPRIM) tablet 50 mg  50 mg Oral Daily PRN Fuller Plan A, MD   50 mg at 10/08/18 0954  . ALPRAZolam Duanne Moron) tablet 0.25 mg  0.25 mg Oral QHS Smith, Rondell A, MD   0.25 mg at 10/07/18 2303  . arformoterol (BROVANA) nebulizer solution 15 mcg  15 mcg Nebulization BID Fuller Plan A, MD   15 mcg at 10/08/18 0741  . aspirin EC tablet 81 mg  81 mg Oral Daily Fuller Plan A, MD   81 mg at 10/08/18 0956  . atorvastatin (LIPITOR) tablet 40 mg  40 mg Oral Daily Fuller Plan A, MD   40 mg at 10/08/18 0955  . budesonide (PULMICORT) nebulizer solution 0.5 mg  0.5 mg Nebulization BID Fuller Plan A, MD   0.5 mg at 10/08/18 0741  . Chlorhexidine Gluconate Cloth 2 % PADS 6 each  6 each Topical Q0600 Roney Jaffe, MD   6 each at 10/08/18 0617  . Chlorhexidine Gluconate Cloth 2 % PADS 6 each  6 each Topical Q0600 Roney Jaffe, MD      . cinacalcet [facility]) tablet 60 mg  60 mg Oral Once per day on Tue Thu Sat Fuller Plan A, MD   60 mg at 10/07/18 1709  . heparin injection 5,000 Units  5,000 Units Subcutaneous Q8H Fuller Plan A, MD   5,000 Units at 10/08/18 0617  . insulin aspart (novoLOG) injection 0-9 Units  0-9 Units Subcutaneous TID WC Fuller Plan A, MD   1 Units at 10/07/18 1709  . lidocaine (LIDODERM) 5 % 1 patch  1 patch Transdermal Q24H Fuller Plan A, MD   1 patch at 10/07/18 1227  . midodrine (PROAMATINE) tablet 10 mg  10 mg Oral Once per day on Tue Thu Sat Fuller Plan A, MD   10 mg at 10/07/18 1120  . midodrine (PROAMATINE) tablet 10 mg  10 mg Oral Q T,Th,Sa-HD Fuller Plan A, MD   10 mg at 10/07/18 0750  . multivitamin (RENA-VIT) tablet 1 tablet  1 tablet Oral Daily Fuller Plan A, MD   1 tablet at 10/08/18 0957  . ondansetron (ZOFRAN) tablet 4 mg  4 mg Oral Q6H PRN Fuller Plan A, MD   4 mg at 10/08/18 0955   Or  .  ondansetron (ZOFRAN) injection 4 mg  4 mg Intravenous Q6H PRN Fuller Plan A, MD      . oxyCODONE-acetaminophen (PERCOCET/ROXICET) 5-325 MG per tablet 1 tablet  1 tablet Oral Q6H PRN Norval Morton, MD   1 tablet at 10/08/18 0956  . sevelamer carbonate (RENVELA) tablet 3,200 mg  3,200 mg Oral TID WC Smith, Rondell A, MD   3,200 mg at 10/08/18 0954  . sodium chloride flush (NS) 0.9 % injection 3 mL  3 mL Intravenous Q12H Smith, Rondell A, MD   3 mL at  10/08/18 1000  . [START ON 10/09/2018] umeclidinium bromide (INCRUSE ELLIPTA) 62.5 MCG/INH 1 puff  1 puff Inhalation Daily Arrien, Jimmy Picket, MD         Discharge Medications: Please see discharge summary for a list of discharge medications.  Relevant Imaging Results:  Relevant Lab Results:   Additional Information SS#: [id number]; HD TTS at [facility], [place]

## 2018-10-09 LAB — CBC
HCT: 33.4 % — ABNORMAL LOW (ref 36.0–46.0)
Hemoglobin: 10.5 g/dL — ABNORMAL LOW (ref 12.0–15.0)
MCH: 28.7 pg (ref 26.0–34.0)
MCHC: 31.4 g/dL (ref 30.0–36.0)
MCV: 91.3 fL (ref 80.0–100.0)
Platelets: 207 10*3/uL (ref 150–400)
RBC: 3.66 MIL/uL — ABNORMAL LOW (ref 3.87–5.11)
RDW: 16.5 % — ABNORMAL HIGH (ref 11.5–15.5)
WBC: 7.7 10*3/uL (ref 4.0–10.5)
nRBC: 0 % (ref 0.0–0.2)

## 2018-10-09 LAB — GLUCOSE, CAPILLARY
Glucose-Capillary: 105 mg/dL — ABNORMAL HIGH (ref 70–99)
Glucose-Capillary: 158 mg/dL — ABNORMAL HIGH (ref 70–99)
Glucose-Capillary: 125 mg/dL — ABNORMAL HIGH (ref 70–99)

## 2018-10-09 LAB — RENAL FUNCTION PANEL
Albumin: 2.8 g/dL — ABNORMAL LOW (ref 3.5–5.0)
Anion gap: 16 — ABNORMAL HIGH (ref 5–15)
BUN: 57 mg/dL — ABNORMAL HIGH (ref 8–23)
CO2: 23 mmol/L (ref 22–32)
Calcium: 9.5 mg/dL (ref 8.9–10.3)
Chloride: 92 mmol/L — ABNORMAL LOW (ref 98–111)
Creatinine, Ser: 11.18 mg/dL — ABNORMAL HIGH (ref 0.44–1.00)
GFR calc Af Amer: 3 mL/min — ABNORMAL LOW (ref >60)
GFR calc non Af Amer: 3 mL/min — ABNORMAL LOW (ref >60)
Glucose, Bld: 101 mg/dL — ABNORMAL HIGH (ref 70–99)
Phosphorus: 6.9 mg/dL — ABNORMAL HIGH (ref 2.5–4.6)
Potassium: 4.5 mmol/L (ref 3.5–5.1)
Sodium: 131 mmol/L — ABNORMAL LOW (ref 135–145)

## 2018-10-09 MED ORDER — POLYETHYLENE GLYCOL 3350 17 G PO PACK
17.0000 g | PACK | Freq: Every day | ORAL | Status: DC
  Administered 2018-10-09 – 2018-10-12 (×4): 17 g via ORAL
  Filled 2018-10-09 (×4): qty 1

## 2018-10-09 MED ORDER — MIDODRINE HCL 5 MG PO TABS
ORAL_TABLET | ORAL | Status: AC
  Filled 2018-10-09: qty 2

## 2018-10-09 MED ORDER — HEPARIN SODIUM (PORCINE) 1000 UNIT/ML IJ SOLN
INTRAMUSCULAR | Status: AC
  Administered 2018-10-09: 13000 [IU]
  Filled 2018-10-09: qty 13

## 2018-10-09 MED ORDER — HEPARIN SODIUM (PORCINE) 1000 UNIT/ML IJ SOLN
INTRAMUSCULAR | Status: AC
  Filled 2018-10-09: qty 9

## 2018-10-09 MED ORDER — SENNOSIDES-DOCUSATE SODIUM 8.6-50 MG PO TABS
1.0000 | ORAL_TABLET | Freq: Every day | ORAL | Status: DC
  Administered 2018-10-10 – 2018-10-11 (×2): 1 via ORAL
  Filled 2018-10-09 (×2): qty 1

## 2018-10-09 NOTE — Progress Notes (Addendum)
PROGRESS NOTE  [name]  DOB: 02/14/1945  PCP: [name], MD [id number]  DOA: 10/04/2018  LOS: 3 days   Brief narrative: Patient is a 73 year old female who presented with severe back pain. Patient does have significant past medical history for end-stage renal disease on hemodialysis, TTS S, hypertension, anemia chronic kidney disease, diastolic heart failure and chronic low back pain.  9/14,patient developed acute back pain while trying to get out of her bed. The pain was severe in intensity, radiated to her lower extremities and provoking difficulty ambulation. Due to her back pain she was unable to go to hemodialysis that day.  She then presented to the ED.  In the ED, blood pressure was 190/90, oxygen saturation 90% on supplemental oxygen by nasal canula.  SARS COVID-19 was negative.  CXR showed cardiomegaly, bilateral interstitial infiltrates with cephalization of the vasculature.   Patient was admitted to the hospital with a working diagnosis of acute hypoxic respiratory failure due to pulmonary edema secondary to volume overload related to end-stage renal disease.  Subjective: Patient was seen and examined this morning.  Elderly African-American female.  Not in distress.  Underwent dialysis this morning  Assessment/Plan: Acute hypoxic respiratory failure  Acute pulmonary edema  Acute on chronic diastolic heart failure due to missed dialysis (Acute on chronic diastolic heart failure) ESRD on HD  -Missed on day of dialysis as an outpatient because of impaired mobility after a fall.  Nephrology consultation appreciated.  Currently on TTS schedule.  Continue sensipar and renvela.  -Continue blood pressure monitoring.  On midodrine on HD to prevent hypotension.  -No oxygen supplementation at home.  On admission required for 3 L by nasal morning.  Currently not on oxygen supplementation  T2DM with dyslipidemia.  -Continue glucose cover and monitoring with  insulin sliding scale.  -Continue with statin.   Morbid obesity - Body mass index is 42.56 kg/m. Patient has been advised to make an attempt to improve diet and exercise patterns to aid in weight loss.  Acute on chronic back pain.Persistent back pain, controlled with analgesics, lumbar spine MRI with L4-L5 spinal stenosis, broad-based disc herniation.Will continue physical therapy evaluation.  Mobility: Encourage ambulation.  Working with PT DVT prophylaxis: Heparin Code Status:  Code Status: Full Code  Family Communication: Expected Discharge: Anticipate discharge to SNF.  Pending authorization.  Medically stable.  Consultants:  Nephrology  Procedures:   Antimicrobials: Anti-infectives (From admission, onward)   None      Diet Order            Diet renal/carb modified with fluid restriction Diet-HS Snack? Nothing; Fluid restriction: 1200 mL Fluid; Room service appropriate? Yes; Fluid consistency: Thin  Diet effective now              Infusions:    Scheduled Meds: . heparin      . ALPRAZolam  0.25 mg Oral QHS  . arformoterol  15 mcg Nebulization BID  . aspirin EC  81 mg Oral Daily  . atorvastatin  40 mg Oral Daily  . budesonide (PULMICORT) nebulizer solution  0.5 mg Nebulization BID  . Chlorhexidine Gluconate Cloth  6 each Topical Q0600  . Chlorhexidine Gluconate Cloth  6 each Topical Q0600  . cinacalcet  60 mg Oral Once per day on Tue Thu Sat  . heparin  5,000 Units Subcutaneous Q8H  . insulin aspart  0-9 Units Subcutaneous TID WC  . lidocaine  1 patch Transdermal Q24H  . midodrine  10 mg Oral Once per  day on Tue Thu Sat  . midodrine  10 mg Oral Q T,Th,Sa-HD  . multivitamin  1 tablet Oral Daily  . sevelamer carbonate  3,200 mg Oral TID WC  . sodium chloride flush  3 mL Intravenous Q12H  . umeclidinium bromide  1 puff Inhalation Daily    PRN meds: acetaminophen **OR** acetaminophen, albuterol, allopurinol, ondansetron **OR** ondansetron  (ZOFRAN) IV, oxyCODONE-acetaminophen   Objective: Vitals:   10/09/18 1121 10/09/18 1211  BP: (!) 107/57 (!) 115/53  Pulse: 69 71  Resp: 18   Temp: 98.3 F (36.8 C) 99.2 F (37.3 C)  SpO2: 95% 96%    Intake/Output Summary (Last 24 hours) at 10/09/2018 1348 Last data filed at 10/09/2018 1121 Gross per 24 hour  Intake 240 ml  Output 1490 ml  Net -1250 ml   Filed Weights   10/07/18 0725 10/09/18 0816 10/09/18 1121  Weight: 120.6 kg 120.8 kg 119.6 kg   Weight change:  Body mass index is 42.56 kg/m.   Physical Exam: General exam: Appears calm and comfortable.  Skin: No rashes, lesions or ulcers. HEENT: Atraumatic, normocephalic, supple neck, no obvious bleeding Lungs: Clear to auscultation bilaterally CVS: Regular rate and rhythm, no murmur GI/Abd soft, nondistended, nontender, bowel sound present CNS: Alert, awake, oriented x3 Psychiatry: Mood appropriate Extremities: Trace bilateral pedal edema, no calf tenderness  Data Review: I have personally reviewed the laboratory data and studies available.  Recent Labs  Lab 10/05/18 1104 10/06/18 0525 10/07/18 0433 10/09/18 0831  WBC 6.0 5.5 6.8 7.7  NEUTROABS  --   --  3.5  --   HGB 11.6* 11.1* 11.3* 10.5*  HCT 35.8* 35.1* 34.1* 33.4*  MCV 90.4 90.7 90.9 91.3  PLT 207 224 212 207   Recent Labs  Lab 10/05/18 1104 10/06/18 0525 10/07/18 0433 10/08/18 0421 10/09/18 0831  NA 140 137 137 134* 131*  K 5.4* 5.2* 4.9 4.7 4.5  CL 93* 95* 91* 96* 92*  CO2 28 27 29 25 23   GLUCOSE 118* 141* 107* 104* 101*  BUN 62* 32* 53* 36* 57*  CREATININE 13.11* 8.23* 10.68* 8.21* 11.18*  CALCIUM 9.4 8.7* 9.1 9.1 9.5  PHOS  --   --   --   --  6.9*    Terrilee Croak, MD  [facility] 10/09/2018

## 2018-10-09 NOTE — Procedures (Signed)
Pt seen in HD. No new c/o's. Awating SNF placement.  No HD issues. No new c/o. Working w PT but unable to walk much due to severe pain.     I was present at this dialysis session, have reviewed the session itself and made  appropriate changes Kelly Splinter MD [place] pager [phone]   10/09/2018, 12:18 PM

## 2018-10-09 NOTE — Progress Notes (Signed)
Patient placed on CPAP at this time, tolerating well. RCP will continue to monitor.

## 2018-10-10 LAB — GLUCOSE, CAPILLARY
Glucose-Capillary: 85 mg/dL (ref 70–99)
Glucose-Capillary: 137 mg/dL — ABNORMAL HIGH (ref 70–99)
Glucose-Capillary: 128 mg/dL — ABNORMAL HIGH (ref 70–99)
Glucose-Capillary: 170 mg/dL — ABNORMAL HIGH (ref 70–99)

## 2018-10-10 NOTE — Progress Notes (Signed)
[name]  DOB: Aug 23, 1945  PCP: [name], MD [id number]  DOA: 10/04/2018  LOS: 4 days   Brief narrative: Patient is a 73 year old female who presented with severe back pain. Patient does have significant past medical history for end-stage renal disease on hemodialysis, TTS S, hypertension, anemia chronic kidney disease, diastolic heart failure and chronic low back pain.  9/14,patient developed acute back pain while trying to get out of her bed. The pain was severe in intensity, radiated to her lower extremities and provoking difficulty ambulation. Due to her back pain she was unable to go to hemodialysis that day.  She then presented to the ED.  In the ED, blood pressure was 190/90, oxygen saturation 90% on supplemental oxygen by nasal canula.  SARS COVID-19 was negative.  CXR showed cardiomegaly, bilateral interstitial infiltrates with cephalization of the vasculature.   Patient was admitted to the hospital with a working [name] of acute hypoxic respiratory failure due to pulmonary edema secondary to volume overload related to end-stage renal disease.  Subjective: Patient was seen and examined this morning.  Elderly African-American female.  Not in distress.  Sitting up in chair.  Not in distress.  No new symptoms.  Underwent dialysis yesterday.  Assessment/Plan: Acute hypoxic respiratory failure  Acute pulmonary edema  Acute on chronic diastolic heart failure due to missed dialysis (Acute on chronic diastolic heart failure) ESRD on HD  -Missed on day of dialysis as an outpatient because of impaired mobility after a fall.  Nephrology consultation appreciated.  Currently on TTS schedule.  Underwent dialysis yesterday.  Continue sensipar and renvela.  -Continue blood pressure monitoring.  On midodrine on HD to prevent hypotension.  -No oxygen supplementation at home.  On admission required for 3 L by nasal cannula.  Currently not on oxygen  supplementation  T2DM with dyslipidemia.  -Continue glucose cover and monitoring with insulin sliding scale.  -Continue with statin.   Morbid obesity - Body mass index is 42.56 kg/m. Patient has been advised to make an attempt to improve diet and exercise patterns to aid in weight loss.  Acute on chronic back pain.Persistent back pain, controlled with analgesics, lumbar spine MRI with L4-L5 spinal stenosis, broad-based disc herniation.Will continue physical therapy evaluation.  Mobility: Encourage ambulation.  Working with PT DVT prophylaxis: Heparin Code Status:  Code Status: Full Code  Family Communication: Expected Discharge: Anticipate discharge to SNF.  Pending authorization.  Medically stable.  Consultants:  Nephrology  Procedures:   Antimicrobials: Anti-infectives (From admission, onward)   None      Diet Order            Diet renal/carb modified with fluid restriction Diet-HS Snack? Nothing; Fluid restriction: 1200 mL Fluid; Room service appropriate? Yes; Fluid consistency: Thin  Diet effective now              Infusions:    Scheduled Meds: . ALPRAZolam  0.25 mg Oral QHS  . arformoterol  15 mcg Nebulization BID  . aspirin EC  81 mg Oral Daily  . atorvastatin  40 mg Oral Daily  . budesonide (PULMICORT) nebulizer solution  0.5 mg Nebulization BID  . Chlorhexidine Gluconate Cloth  6 each Topical Q0600  . Chlorhexidine Gluconate Cloth  6 each Topical Q0600  . cinacalcet  60 mg Oral Once per day on Tue Thu Sat  . heparin  5,000 Units Subcutaneous Q8H  . insulin aspart  0-9 Units Subcutaneous TID WC  . lidocaine  1 patch Transdermal Q24H  .  midodrine  10 mg Oral Once per day on Tue Thu Sat  . midodrine  10 mg Oral Q T,Th,Sa-HD  . multivitamin  1 tablet Oral Daily  . polyethylene glycol  17 g Oral Daily  . senna-docusate  1 tablet Oral QHS  . sevelamer carbonate  3,200 mg Oral TID WC  . sodium chloride flush  3 mL Intravenous Q12H  .  umeclidinium bromide  1 puff Inhalation Daily    PRN meds: acetaminophen **Tiffany** acetaminophen, albuterol, allopurinol, ondansetron **Tiffany** ondansetron (ZOFRAN) IV, oxyCODONE-acetaminophen   Objective: Vitals:   10/10/18 0800 10/10/18 0801  BP:    Pulse:    Resp:    Temp:    SpO2: 92% 92%    Intake/Output Summary (Last 24 hours) at 10/10/2018 1623 Last data filed at 10/10/2018 1000 Gross per 24 hour  Intake 60 ml  Output -  Net 60 ml   Filed Weights   10/07/18 0725 10/09/18 0816 10/09/18 1121  Weight: 120.6 kg 120.8 kg 119.6 kg   Weight change:  Body mass index is 42.56 kg/m.   Physical Exam: General exam: Appears calm and comfortable.  Skin: No rashes, lesions Tiffany ulcers. HEENT: Atraumatic, normocephalic, supple neck, no obvious bleeding Lungs: Clear to auscultation bilaterally CVS: Regular rate and rhythm, no murmur GI/Abd soft, nondistended, nontender, bowel sound present CNS: Alert, awake, oriented x3 Psychiatry: Mood appropriate Extremities: Trace bilateral pedal edema, no calf tenderness  Data Review: I have personally reviewed the laboratory data and studies available.  Recent Labs  Lab 10/05/18 1104 10/06/18 0525 10/07/18 0433 10/09/18 0831  WBC 6.0 5.5 6.8 7.7  NEUTROABS  --   --  3.5  --   HGB 11.6* 11.1* 11.3* 10.5*  HCT 35.8* 35.1* 34.1* 33.4*  MCV 90.4 90.7 90.9 91.3  PLT 207 224 212 207   Recent Labs  Lab 10/05/18 1104 10/06/18 0525 10/07/18 0433 10/08/18 0421 10/09/18 0831  NA 140 137 137 134* 131*  K 5.4* 5.2* 4.9 4.7 4.5  CL 93* 95* 91* 96* 92*  CO2 28 27 29 25 23   GLUCOSE 118* 141* 107* 104* 101*  BUN 62* 32* 53* 36* 57*  CREATININE 13.11* 8.23* 10.68* 8.21* 11.18*  CALCIUM 9.4 8.7* 9.1 9.1 9.5  PHOS  --   --   --   --  6.9*    Tiffany Croak, MD  [facility] 10/10/2018

## 2018-10-10 NOTE — Progress Notes (Signed)
[facility] Progress Note  Subjective: seen in room. In good spiritis.   Vitals:   10/10/18 0758 10/10/18 0800 10/10/18 0801 10/10/18 1635  BP:    (!) 123/50  Pulse:    68  Resp:    20  Temp:    98.4 F (36.9 C)  TempSrc:      SpO2: 92% 92% 92% 98%  Weight:      Height:        Inpatient medications: . ALPRAZolam  0.25 mg Oral QHS  . arformoterol  15 mcg Nebulization BID  . aspirin EC  81 mg Oral Daily  . atorvastatin  40 mg Oral Daily  . budesonide (PULMICORT) nebulizer solution  0.5 mg Nebulization BID  . Chlorhexidine Gluconate Cloth  6 each Topical Q0600  . Chlorhexidine Gluconate Cloth  6 each Topical Q0600  . cinacalcet  60 mg Oral Once per day on Tue Thu Sat  . heparin  5,000 Units Subcutaneous Q8H  . insulin aspart  0-9 Units Subcutaneous TID WC  . lidocaine  1 patch Transdermal Q24H  . midodrine  10 mg Oral Once per day on Tue Thu Sat  . midodrine  10 mg Oral Q T,Th,Sa-HD  . multivitamin  1 tablet Oral Daily  . polyethylene glycol  17 g Oral Daily  . senna-docusate  1 tablet Oral QHS  . sevelamer carbonate  3,200 mg Oral TID WC  . sodium chloride flush  3 mL Intravenous Q12H  . umeclidinium bromide  1 puff Inhalation Daily    acetaminophen **OR** acetaminophen, albuterol, allopurinol, ondansetron **OR** ondansetron (ZOFRAN) IV, oxyCODONE-acetaminophen    Exam: General= alert, Obese female  alert, stable HEENT =scera anicteric, throat clear  No jvd or bruits Lungs = CTA bilat Card= RRR no MRG  Abd obese  soft ntnd no mass or ascites +bs Ext = trace edema Neuro I=s alert, Ox 3 , moves all extrem. Dialysis Access =AVF+bruit  D[facility]: [facility]  4.5h   119 kg   2/2 bath  Hep 13000   LUA AVF sensipar 60 mg po q hd    Assessment/Plan: 1. Volume overload / Pulmonary edema= secondary to missed HD. Resolved, down 6kg from admit. Better.  2. ESRD - usual HD is TTS.  Next HD Tuesday.  3. HypOtension/volume  - as above  Fu wts with uf on hd .  Is on midodrine tid and pre HD.  4. Anemia  -  hgb 11.6 no op ESA , fu hgb trend  5. Metabolic bone disease -   No vit d on hd / sensipar on hd / renvela binder 6. Lower Back pain with  DJD  L3-4 , L4-5  7. Dispo - working w/ PT, awaiting SNF acceptance for rehab. Pt lives alone, severe back problems/ pain w/ ambulating.     Rob Doctor, hospital 10/10/2018, 5:35 PM  Iron/TIBC/Ferritin/ %Sat    Component Value Date/Time   IRON 185 (H) 02/07/2016 1137   TIBC 272 02/07/2016 1137   FERRITIN 2,583 (H) 02/07/2016 1137   IRONPCTSAT 68 (H) 02/07/2016 1137   Recent Labs  Lab 10/09/18 0831  NA 131*  K 4.5  CL 92*  CO2 23  GLUCOSE 101*  BUN 57*  CREATININE 11.18*  CALCIUM 9.5  PHOS 6.9*  ALBUMIN 2.8*   Recent Labs  Lab 10/05/18 1104  AST 18  ALT 13  ALKPHOS 52  BILITOT 0.7  PROT 7.6   Recent Labs  Lab 10/09/18 0831  WBC 7.7  HGB 10.5*  HCT 33.4*  PLT 207

## 2018-10-11 LAB — GLUCOSE, CAPILLARY
Glucose-Capillary: 85 mg/dL (ref 70–99)
Glucose-Capillary: 145 mg/dL — ABNORMAL HIGH (ref 70–99)
Glucose-Capillary: 122 mg/dL — ABNORMAL HIGH (ref 70–99)
Glucose-Capillary: 144 mg/dL — ABNORMAL HIGH (ref 70–99)

## 2018-10-11 LAB — SARS CORONAVIRUS 2 (TAT 6-24 HRS): SARS Coronavirus 2: NEGATIVE

## 2018-10-11 MED ORDER — CHLORHEXIDINE GLUCONATE CLOTH 2 % EX PADS
6.0000 | MEDICATED_PAD | Freq: Every day | CUTANEOUS | Status: DC
  Administered 2018-10-12: 6 via TOPICAL

## 2018-10-11 NOTE — Progress Notes (Signed)
Physical Therapy Treatment Patient Details Name: [name] MRN: [id number] DOB: [birth year] Today's Date: 10/11/2018    History of Present Illness 73 yo female with onset of back pain suddenly the day before admission, with no previous history per pt. On O2 only during HD, but concerned she needs otherwise.  Pt is having pulm edema, fluid overload.  PMHx:  HD for ESRD, abd abscess, L4-5 spinal stenosis, L 3-4 stenosis, DM, OSA, CHF, gout, CAD, MI, transfusions, HTN, R eye blind, SOB    PT Comments    Pt agreeable to participating.  Interested in trying to do for herself before allowing assist.  Emphasis on sit to stands, gait stability and stamina without oxygen.  Monitoring oxygen showed low sat of 91% and hight HR of 89 bpm during/after gait, but with moderate dyspnea and increased back pain.    Follow Up Recommendations  SNF;Supervision for mobility/OOB;Supervision/Assistance - 24 hour     Equipment Recommendations  None recommended by PT    Recommendations for Other Services       Precautions / Restrictions Precautions Precautions: Fall Precaution Comments: blind R eye    Mobility  Bed Mobility               General bed mobility comments: up in chair  Transfers Overall transfer level: Needs assistance Equipment used: Rolling walker (2 wheeled) Transfers: Sit to/from Stand Sit to Stand: Min guard         General transfer comment: cues for hand placement safety  Ambulation/Gait Ambulation/Gait assistance: Min assist(short episodes of min guard) Gait Distance (Feet): 90 Feet(then 75 feet after 4 min rest.) Assistive device: Rolling walker (2 wheeled) Gait Pattern/deviations: Step-through pattern Gait velocity: slower with minor increase in speed to get back to chair quicker   General Gait Details: generally steady with episodes of mild instability.  Still leaning moderately onto the RW.  Noticeable fatigue.  Sats without O2/ EHR as follows.  At rest,  95/69, Mid gait cycle 91%/69 with min dyspnea, End of 1wt gait cycle with moderate dyspnea 91%/69.  After 2nd gait cycle 91%/89 bpm.   Stairs             Wheelchair Mobility    Modified Rankin (Stroke Patients Only)       Balance Overall balance assessment: Needs assistance Sitting-balance support: Feet supported Sitting balance-Leahy Scale: Good       Standing balance-Leahy Scale: Poor                              Cognition Arousal/Alertness: Awake/alert Behavior During Therapy: WFL for tasks assessed/performed Overall Cognitive Status: Within Functional Limits for tasks assessed                                        Exercises Other Exercises Other Exercises: Warm up hip/knee flexion/ext ROM x10 prior to mobility    General Comments General comments (skin integrity, edema, etc.): Back pain increased from 6 to 7/10 from before to after gait.      Pertinent Vitals/Pain Pain Assessment: 0-10 Pain Score: 7  Pain Location: low back and into legs Pain Descriptors / Indicators: Stabbing;Sharp Pain Intervention(s): Monitored during session    Home Living                      Prior Function  PT Goals (current goals can now be found in the care plan section) Acute Rehab PT Goals Patient Stated Goal: reduce pain PT Goal Formulation: With patient Time For Goal Achievement: 10/20/18 Potential to Achieve Goals: Good Progress towards PT goals: Progressing toward goals    Frequency    Min 3X/week      PT Plan Current plan remains appropriate    Co-evaluation              AM-PAC PT "6 Clicks" Mobility   Outcome Measure  Help needed turning from your back to your side while in a flat bed without using bedrails?: A Little Help needed moving from lying on your back to sitting on the side of a flat bed without using bedrails?: A Little Help needed moving to and from a bed to a chair (including a  wheelchair)?: A Little Help needed standing up from a chair using your arms (e.g., wheelchair or bedside chair)?: A Little Help needed to walk in hospital room?: A Little Help needed climbing 3-5 steps with a railing? : Total 6 Click Score: 16    End of Session   Activity Tolerance: Patient tolerated treatment well;Patient limited by pain Patient left: in chair;with call bell/phone within reach Nurse Communication: Mobility status;Patient requests pain meds PT Visit Diagnosis: Unsteadiness on feet (R26.81);Difficulty in walking, not elsewhere classified (R26.2);Other abnormalities of gait and mobility (R26.89)     Time: [id number] PT Time Calculation (min) (ACUTE ONLY): 31 min  Charges:  $Gait Training: 8-22 mins $Therapeutic Activity: 8-22 mins                     10/11/2018  Donnella Sham, PT [place] [phone]  (pager) [phone]  (office)   Tessie Fass Donn Zanetti 10/11/2018, 1:29 PM

## 2018-10-11 NOTE — Progress Notes (Signed)
PROGRESS NOTE  [name]  DOB: Oct 27, 1945  PCP: [name], MD [id number]  DOA: 10/04/2018  LOS: 5 days   Brief narrative: Patient is a 73 year old female who presented with severe back pain. Patient does have significant past medical history for end-stage renal disease on hemodialysis, TTS S, hypertension, anemia chronic kidney disease, diastolic heart failure and chronic low back pain.  9/14,patient developed acute back pain while trying to get out of her bed. The pain was severe in intensity, radiated to her lower extremities and provoking difficulty ambulation. Due to her back pain she was unable to go to hemodialysis that day.  She then presented to the ED.   In the ED, blood pressure was 190/90, oxygen saturation 90% on supplemental oxygen by nasal canula.  SARS COVID-19 was negative.  CXR showed cardiomegaly, bilateral interstitial infiltrates with cephalization of the vasculature.   Patient was admitted to the hospital with a working diagnosis of acute hypoxic respiratory failure due to pulmonary edema secondary to volume overload related to end-stage renal disease.  Subjective: Patient was seen and examined this morning.  Elderly African-American female.  Not in distress.  Sitting up in chair. No new symptoms.  Waiting for placement.  Assessment/Plan: Acute hypoxic respiratory failure  Acute pulmonary edema  Acute on chronic diastolic heart failure due to missed dialysis (Acute on chronic diastolic heart failure) ESRD on HD  -Missed one day of dialysis as an outpatient because of impaired mobility after a fall.  Nephrology consultation appreciated.  Currently on TTS schedule.  Underwent dialysis on Saturday.  Currently close to her dry weight.  Continue sensipar and renvela.  -Continue blood pressure monitoring.  On midodrine on HD to prevent hypotension.  -No oxygen supplementation at home.  On admission required for 3 L by nasal cannula.  Currently  breathing comfortably in room air.  T2DM with dyslipidemia.  -Continue glucose cover and monitoring with insulin sliding scale.  -Continue with statin.   Morbid obesity - Body mass index is 42.56 kg/m. Patient has been advised to make an attempt to improve diet and exercise patterns to aid in weight loss.  Acute on chronic back pain -Persistent back pain, controlled with analgesics, lumbar spine MRI with L4-L5 spinal stenosis, broad-based disc herniation.Will continue physical therapy evaluation.  Mobility: Encourage ambulation.  Working with PT DVT prophylaxis: Heparin Code Status:  Code Status: Full Code  Family Communication: Expected Discharge: Anticipate discharge to SNF.  Pending authorization.  Medically stable.  COVID test ordered today.  Consultants:  Nephrology  Procedures:   Antimicrobials: Anti-infectives (From admission, onward)   None      Diet Order            Diet renal/carb modified with fluid restriction Diet-HS Snack? Nothing; Fluid restriction: 1200 mL Fluid; Room service appropriate? Yes; Fluid consistency: Thin  Diet effective now              Infusions:    Scheduled Meds: . ALPRAZolam  0.25 mg Oral QHS  . arformoterol  15 mcg Nebulization BID  . aspirin EC  81 mg Oral Daily  . atorvastatin  40 mg Oral Daily  . budesonide (PULMICORT) nebulizer solution  0.5 mg Nebulization BID  . Chlorhexidine Gluconate Cloth  6 each Topical Q0600  . cinacalcet  60 mg Oral Once per day on Tue Thu Sat  . heparin  5,000 Units Subcutaneous Q8H  . insulin aspart  0-9 Units Subcutaneous TID WC  . lidocaine  1 patch Transdermal  Q24H  . midodrine  10 mg Oral Once per day on Tue Thu Sat  . midodrine  10 mg Oral Q T,Th,Sa-HD  . multivitamin  1 tablet Oral Daily  . polyethylene glycol  17 g Oral Daily  . senna-docusate  1 tablet Oral QHS  . sevelamer carbonate  3,200 mg Oral TID WC  . sodium chloride flush  3 mL Intravenous Q12H  . umeclidinium  bromide  1 puff Inhalation Daily    PRN meds: acetaminophen **OR** acetaminophen, albuterol, allopurinol, ondansetron **OR** ondansetron (ZOFRAN) IV, oxyCODONE-acetaminophen   Objective: Vitals:   10/11/18 0759 10/11/18 0836  BP: 133/64   Pulse: 69   Resp: 20   Temp: 98.3 F (36.8 C)   SpO2: 98% 97%    Intake/Output Summary (Last 24 hours) at 10/11/2018 1123 Last data filed at 10/11/2018 0909 Gross per 24 hour  Intake 3 ml  Output -  Net 3 ml   Filed Weights   10/07/18 0725 10/09/18 0816 10/09/18 1121  Weight: 120.6 kg 120.8 kg 119.6 kg   Weight change:  Body mass index is 42.56 kg/m.   Physical Exam: General exam: Appears calm and comfortable.  Skin: No rashes, lesions or ulcers. HEENT: Atraumatic, normocephalic, supple neck, no obvious bleeding Lungs: Clear to auscultation bilaterally CVS: Regular rate and rhythm, no murmur GI/Abd soft, nondistended, nontender, bowel sound present CNS: Alert, awake, oriented x3 Psychiatry: Mood appropriate Extremities: Trace bilateral pedal edema, no calf tenderness  Data Review: I have personally reviewed the laboratory data and studies available.  Recent Labs  Lab 10/05/18 1104 10/06/18 0525 10/07/18 0433 10/09/18 0831  WBC 6.0 5.5 6.8 7.7  NEUTROABS  --   --  3.5  --   HGB 11.6* 11.1* 11.3* 10.5*  HCT 35.8* 35.1* 34.1* 33.4*  MCV 90.4 90.7 90.9 91.3  PLT 207 224 212 207   Recent Labs  Lab 10/05/18 1104 10/06/18 0525 10/07/18 0433 10/08/18 0421 10/09/18 0831  NA 140 137 137 134* 131*  K 5.4* 5.2* 4.9 4.7 4.5  CL 93* 95* 91* 96* 92*  CO2 28 27 29 25 23   GLUCOSE 118* 141* 107* 104* 101*  BUN 62* 32* 53* 36* 57*  CREATININE 13.11* 8.23* 10.68* 8.21* 11.18*  CALCIUM 9.4 8.7* 9.1 9.1 9.5  PHOS  --   --   --   --  6.9*    Terrilee Croak, MD  [facility] 10/11/2018

## 2018-10-11 NOTE — TOC Progression Note (Signed)
Transition of Care [facility]) - Progression Note    Patient Details  Name: [name] MRN: [id number] Date of Birth: Jun 03, 1945  Transition of Care [facility]) CM/SW [place], [place] Phone Number: 10/11/2018, 3:35 PM  Clinical Narrative:  CSW met with patient to discuss offers, and patient only has one offer for SNF [facility]. Patient said she wasn't happy with that, as she has heard not great things about it, but she knows she has to do what she has to do, so she's ok with it. Miquel Dunn has started [facility] for SNF. CSW asked MD for updated COVID test.      Expected Discharge Plan: [place] Barriers to Discharge: [facility], Continued Medical Work up  Expected Discharge Plan and Services Expected Discharge Plan: [place] In-house Referral: Clinical Social Work Discharge Planning Services: NA Post Acute Care Choice: [place] Living arrangements for the past 2 months: Single Family Home                 DME Arranged: N/A DME Agency: NA       HH Arranged: NA [place] Agency: NA         Social Determinants of Health (SDOH) Interventions    Readmission Risk Interventions Readmission Risk Prevention Plan 06/23/2018 06/22/2018 06/21/2018  Transportation Screening - - Complete  PCP or Specialist Appt within 3-5 Days - Complete -  HRI or Home Care Consult - - Complete  Social Work Consult for [place] Planning/Counseling - - Complete  Palliative Care Screening - Not Applicable -  Medication Review [employer] photographer) - - Complete  PCP or Specialist appointment within 3-5 days of discharge Complete - -  HRI or Home Care Consult Complete - -  SW Recovery Care/Counseling Consult Complete - -  Palliative Care Screening Not Applicable - -  Skilled Nursing Facility Complete - -  Some recent data might be hidden

## 2018-10-11 NOTE — Progress Notes (Signed)
[facility] NEPHROLOGY PROGRESS NOTE  Assessment/ Plan: Pt is a 73 y.o. yo female ESRD on HD TTS, HTN, anemia, CHF, admitted with hypoxia and pulmonary edema.  Dialysis:[facility]  4.5h   119 kg 2/2 bath  Hep 13000   LUA AVF sensipar 60 mg po q hd  #Hypoxia/pulmonary edema due to missed dialysis.  Now on room air.  Improved.  She is close to her dry weight.  # ESRD: TTS schedule.  Plan for next dialysis tomorrow.  # Anemia: Hemoglobin acceptable.  Continue to monitor.  # Secondary hyperparathyroidism: Continue Sensipar and binders.  Monitor phosphorus level.  #Chronic hypotension/volume: Uses midodrine during dialysis.  Blood pressure acceptable.  #Dispo: PT OT evaluation.  Okay to discharge from renal perspective.  Subjective: Seen and examined at bedside.  She feels some better with some baseline dyspnea on exertion.  Denied chest pain, cough, nausea, vomiting.  On room air. Objective Vital signs in last 24 hours: Vitals:   10/10/18 2051 10/10/18 2321 10/11/18 0759 10/11/18 0836  BP:  103/80 133/64   Pulse:  63 69   Resp:  14 20   Temp:  97.9 F (36.6 C) 98.3 F (36.8 C)   TempSrc:  Oral Oral   SpO2: 95% 99% 98% 97%  Weight:      Height:       Weight change:   Intake/Output Summary (Last 24 hours) at 10/11/2018 0935 Last data filed at 10/11/2018 [id number] Gross per 24 hour  Intake 63 ml  Output -  Net 63 ml       Labs: Basic Metabolic Panel: Recent Labs  Lab 10/07/18 0433 10/08/18 0421 10/09/18 0831  NA 137 134* 131*  K 4.9 4.7 4.5  CL 91* 96* 92*  CO2 29 25 23   GLUCOSE 107* 104* 101*  BUN 53* 36* 57*  CREATININE 10.68* 8.21* 11.18*  CALCIUM 9.1 9.1 9.5  PHOS  --   --  6.9*   Liver Function Tests: Recent Labs  Lab 10/05/18 1104 10/09/18 0831  AST 18  --   ALT 13  --   ALKPHOS 52  --   BILITOT 0.7  --   PROT 7.6  --   ALBUMIN 3.4* 2.8*   No results for input(s): LIPASE, AMYLASE in the last 168 hours. No results for input(s):  AMMONIA in the last 168 hours. CBC: Recent Labs  Lab 10/05/18 1104 10/06/18 0525 10/07/18 0433 10/09/18 0831  WBC 6.0 5.5 6.8 7.7  NEUTROABS  --   --  3.5  --   HGB 11.6* 11.1* 11.3* 10.5*  HCT 35.8* 35.1* 34.1* 33.4*  MCV 90.4 90.7 90.9 91.3  PLT 207 224 212 207   Cardiac Enzymes: No results for input(s): CKTOTAL, CKMB, CKMBINDEX, TROPONINI in the last 168 hours. CBG: Recent Labs  Lab 10/10/18 0756 10/10/18 1134 10/10/18 1635 10/10/18 2125 10/11/18 0756  GLUCAP 85 137* 128* 170* 85    Iron Studies: No results for input(s): IRON, TIBC, TRANSFERRIN, FERRITIN in the last 72 hours. Studies/Results: No results found.  Medications: Infusions:   Scheduled Medications: . ALPRAZolam  0.25 mg Oral QHS  . arformoterol  15 mcg Nebulization BID  . aspirin EC  81 mg Oral Daily  . atorvastatin  40 mg Oral Daily  . budesonide (PULMICORT) nebulizer solution  0.5 mg Nebulization BID  . Chlorhexidine Gluconate Cloth  6 each Topical Q0600  . Chlorhexidine Gluconate Cloth  6 each Topical Q0600  . cinacalcet  60 mg Oral Once per  day on Tue Thu Sat  . heparin  5,000 Units Subcutaneous Q8H  . insulin aspart  0-9 Units Subcutaneous TID WC  . lidocaine  1 patch Transdermal Q24H  . midodrine  10 mg Oral Once per day on Tue Thu Sat  . midodrine  10 mg Oral Q T,Th,Sa-HD  . multivitamin  1 tablet Oral Daily  . polyethylene glycol  17 g Oral Daily  . senna-docusate  1 tablet Oral QHS  . sevelamer carbonate  3,200 mg Oral TID WC  . sodium chloride flush  3 mL Intravenous Q12H  . umeclidinium bromide  1 puff Inhalation Daily    have reviewed scheduled and prn medications.  Physical Exam: General:NAD, comfortable Heart:RRR, s1s2 nl Lungs:clear b/l, no crackle Abdomen:soft, Non-tender, non-distended Extremities:No edema Dialysis Access: Left upper extremity AV fistula has good thrill and bruit.  Dron Prasad Bhandari 10/11/2018,9:35 AM  LOS: 5 days  Pager: [phone]

## 2018-10-12 LAB — GLUCOSE, CAPILLARY: Glucose-Capillary: 72 mg/dL (ref 70–99)

## 2018-10-12 MED ORDER — OXYCODONE-ACETAMINOPHEN 5-325 MG PO TABS: 1.0000 | ORAL_TABLET | Freq: Four times a day (QID) | ORAL | 0 refills | Status: AC | PRN

## 2018-10-12 MED ORDER — ACETAMINOPHEN 325 MG PO TABS: 650.0000 mg | ORAL_TABLET | Freq: Four times a day (QID) | ORAL | 0 refills | Status: AC | PRN

## 2018-10-12 MED ORDER — POLYETHYLENE GLYCOL 3350 17 G PO PACK: 17.0000 g | PACK | Freq: Every day | ORAL | 0 refills | Status: AC

## 2018-10-12 NOTE — Progress Notes (Addendum)
[facility] PROGRESS NOTE  Assessment/ Plan: Pt is a 73 y.o. yo female ESRD on HD TTS, HTN, anemia, CHF, admitted with hypoxia and pulmonary edema.  Dialysis:[facility]  4.5h   119 kg 2/2 bath  Hep 13000   LUA AVF sensipar 60 mg po q hd  #Hypoxia/pulmonary edema due to missed dialysis.  Now on room air.  Improved.  She is close to her dry weight.  # ESRD: TTS schedule.  Plan for dialysis today.  It was noted that outpatient dialysis was arranged today.  # Anemia: Hemoglobin acceptable.  Continue to monitor.  # Secondary hyperparathyroidism: Continue Sensipar and binders.  Monitor phosphorus level.  #Chronic hypotension/volume: Uses midodrine during dialysis.  Blood pressure acceptable.  #Dispo: PT OT evaluation.  Okay to discharge from renal perspective.  Subjective: Seen and examined at bedside.  Doing well.  Denies nausea, vomiting, chest pain, shortness of breath.  Plan for dialysis today. Objective Vital signs in last 24 hours: Vitals:   10/11/18 2321 10/12/18 0100 10/12/18 0815 10/12/18 0825  BP: (!) 125/49  (!) 114/51   Pulse: 62 63 64   Resp: 20 (!) 23 16   Temp: 98.6 F (37 C)  (!) 96.8 F (36 C)   TempSrc:   Axillary   SpO2: 97% 98% 97% 97%  Weight:      Height:       Weight change:  No intake or output data in the 24 hours ending 10/12/18 0928     Labs: Basic Metabolic Panel: Recent Labs  Lab 10/07/18 0433 10/08/18 0421 10/09/18 0831  NA 137 134* 131*  K 4.9 4.7 4.5  CL 91* 96* 92*  CO2 29 25 23   GLUCOSE 107* 104* 101*  BUN 53* 36* 57*  CREATININE 10.68* 8.21* 11.18*  CALCIUM 9.1 9.1 9.5  PHOS  --   --  6.9*   Liver Function Tests: Recent Labs  Lab 10/05/18 1104 10/09/18 0831  AST 18  --   ALT 13  --   ALKPHOS 52  --   BILITOT 0.7  --   PROT 7.6  --   ALBUMIN 3.4* 2.8*   No results for input(s): LIPASE, AMYLASE in the last 168 hours. No results for input(s): AMMONIA in the last 168 hours. CBC: Recent  Labs  Lab 10/05/18 1104 10/06/18 0525 10/07/18 0433 10/09/18 0831  WBC 6.0 5.5 6.8 7.7  NEUTROABS  --   --  3.5  --   HGB 11.6* 11.1* 11.3* 10.5*  HCT 35.8* 35.1* 34.1* 33.4*  MCV 90.4 90.7 90.9 91.3  PLT 207 224 212 207   Cardiac Enzymes: No results for input(s): CKTOTAL, CKMB, CKMBINDEX, TROPONINI in the last 168 hours. CBG: Recent Labs  Lab 10/11/18 0756 10/11/18 1158 10/11/18 1620 10/11/18 2034 10/12/18 0819  GLUCAP 85 145* 122* 144* 72    Iron Studies: No results for input(s): IRON, TIBC, TRANSFERRIN, FERRITIN in the last 72 hours. Studies/Results: No results found.  Medications: Infusions:   Scheduled Medications: . ALPRAZolam  0.25 mg Oral QHS  . arformoterol  15 mcg Nebulization BID  . aspirin EC  81 mg Oral Daily  . atorvastatin  40 mg Oral Daily  . budesonide (PULMICORT) nebulizer solution  0.5 mg Nebulization BID  . Chlorhexidine Gluconate Cloth  6 each Topical Q0600  . cinacalcet  60 mg Oral Once per day on Tue Thu Sat  . heparin  5,000 Units Subcutaneous Q8H  . insulin aspart  0-9 Units Subcutaneous TID  WC  . lidocaine  1 patch Transdermal Q24H  . midodrine  10 mg Oral Once per day on Tue Thu Sat  . midodrine  10 mg Oral Q T,Th,Sa-HD  . multivitamin  1 tablet Oral Daily  . polyethylene glycol  17 g Oral Daily  . senna-docusate  1 tablet Oral QHS  . sevelamer carbonate  3,200 mg Oral TID WC  . sodium chloride flush  3 mL Intravenous Q12H  . umeclidinium bromide  1 puff Inhalation Daily    have reviewed scheduled and prn medications.  Physical Exam: Unchanged General:NAD, comfortable Heart:RRR, s1s2 nl Lungs:clear b/l, no crackle Abdomen:soft, Non-tender, non-distended Extremities:No edema Dialysis Access: Left upper extremity AV fistula has good thrill and bruit.  Donovon Micheletti Prasad Arlan Birks 10/12/2018,9:28 AM  LOS: 6 days  Pager: [phone]

## 2018-10-12 NOTE — Progress Notes (Addendum)
Renal Navigator spoke with CSW/L. Paisley who states patient is ready for discharge to [facility] today. Patient's OP HD treatment is scheduled for 12:10pm at [facility] and per CSW she can be picked up from OP HD clinic by SNF. Renal Navigator arranged for PTAR pick up from hospital to take to HD clinic. PTAR to arrive between 10:45-11:00am today to get patient to her OP HD appointment by 11:45am. Renal Navigator met with patient to review plan. Patient states understanding.  Alphonzo Cruise, [place] Renal Navigator [phone]

## 2018-10-12 NOTE — Discharge Summary (Signed)
Physician Discharge Summary  [name] [id number] DOB: Jul 24, 1945 DOA: 10/04/2018  PCP: [name], MD  Admit date: 10/04/2018 Discharge date: 10/12/2018  Admitted From: Home  Disposition:   SNF   Recommendations for Outpatient Follow-up and new medication changes:  1. Follow up with Dr. Ardeth Perfect in 7 days.  2. Patient placed on oxycodone and acetaminophen for pain control, continue with physical therapy.  3. Holding hydralazine for now.   Home Health: NA   Equipment/Devices: NA    Discharge Condition: stable  CODE STATUS: full  Diet recommendation: heart healthy and diabetic prudent.   Brief/Interim Summary: 73 year old female who presented with severe back pain.  Patient does have significant past medical history for end-stage renal disease on hemodialysis, TTS, hypertension, anemia chronic kidney disease, diastolic heart failure and chronic low back pain.  Patient developed acute back pain while trying to get out of her bed.  The pain was severe in intensity, radiated to her lower extremities and provoking difficulty ambulation.  Due to her back pain she was unable to go to hemodialysis.  Her prior hemodialysis was [facility] September 11, 1 day prior to her routine day because she went out of town.  By the time of admission had passed 4 days without hemodialysis.  In the emergency department blood pressure was 190/90, respiratory rate 20, oxygen saturation 97% on supplemental oxygen per nasal cannula.  Her lungs had rales bilaterally, heart S1-S2 present and rhythmic, abdomen soft, no lower extremity edema. Sodium 140, potassium 5.4, chloride 93, bicarb 28, glucose BUN 62, creatinine 13.1.  White count 6.0, hemoglobin 9.6, hematocrit 35.8, platelets 107.  SARS COVID-19 was negative.  Her chest radiograph had cardiomegaly, bilateral interstitial infiltrates with cephalization of the vasculature.  EKG 76 bpm, left axis deviation, prolonged QTC 539, sinus rhythm with PACs, no ST  segment or T wave changes.  Patient was admitted to the hospital with a working diagnosis of acute hypoxic respiratory failure due to pulmonary edema due to volume overload related to end-stage renal disease.  She had further work-up for her back pain with a spine MRI discogenic endplate marrow changes at the L3-L4, and L4-L5  Patient underwent ultrafiltration with good toleration, normovolemia.  For her persistent back pain she received physical therapy and analgesics.  Physical therapy recommended skilled nursing facility.  1.  Acute hypoxic respiratory failure due to volume overload related pulmonary edema, complicated by hyperkalemia, in the setting of end-stage renal disease on hemodialysis.  Patient underwent hemodialysis with ultrafiltration, her electrolytes and volume match restored.  Patient will continue taking hemodialysis as an outpatient. Continue renvela.   2.  Acute on chronic diastolic heart failure decompensation.  Hypervolemia due to end-stage renal disease, patient underwent ultrafiltration with toleration.  Continue midodrine for transitory hypotension.  3.  Type 2 diabetes mellitus with dyslipidemia.  Capillary glucose was closely monitored, her glucose remained stable.  During her hospitalization received insulin sliding scale, at discharge will continue diet control.  Continue statin therapy.  4.  Obesity.  Calculated BMI is 42.9.  5.  Acute on chronic back pain, related to L4-L5 spinal stenosis and broad-based disc herniation.  Patient underwent further work-up with lumbar spine MRI which show L4-L5 spinal stenosis, broad-based disc herniation.  Physical therapy recommended continue therapy at skilled nursing facility.  Continue pain control with oxycodone and acetaminophen.  6. HTN. Blood pressure has remained stable, will continue to hold on hydralazine for now.   7. COPD. No clinical signs of exacerbation, will continue  bronchodilator therapy, anticholinergic and  inhaled corticosteroids.   Discharge Diagnoses:  Active Problems:   End stage renal disease (HCC)   OSA    Type II diabetes mellitus with renal manifestations (HCC)   Chronic diastolic CHF (congestive heart failure) (HCC)   Fluid overload    Discharge Instructions   Allergies as of 10/12/2018      Reactions   Adhesive [tape] Rash   Lyrica [pregabalin] Other (See Comments)   Hallucinations      Medication List    STOP taking these medications   ALPRAZolam 0.25 MG tablet Commonly known as: XANAX   hydrALAZINE 25 MG tablet Commonly known as: APRESOLINE   insulin aspart 100 UNIT/ML injection Commonly known as: novoLOG     TAKE these medications   acetaminophen 325 MG tablet Commonly known as: TYLENOL Take 2 tablets (650 mg total) by mouth every 6 (six) hours as needed for mild pain (or Fever >/= 101).   albuterol (2.5 MG/3ML) 0.083% nebulizer solution Commonly known as: PROVENTIL Take 3 mLs (2.5 mg total) by nebulization every 4 (four) hours as needed for shortness of breath.   allopurinol 100 MG tablet Commonly known as: ZYLOPRIM Take 0.5 tablets (50 mg total) by mouth daily. What changed:   when to take this  reasons to take this   aspirin EC 81 MG tablet Take 81 mg by mouth daily.   atorvastatin 40 MG tablet Commonly known as: LIPITOR Take 40 mg by mouth daily.   budesonide-formoterol 160-4.5 MCG/ACT inhaler Commonly known as: Symbicort Inhale 2 puffs into the lungs 2 (two) times daily.   cinacalcet 60 MG tablet Commonly known as: SENSIPAR Take 60 mg by mouth See admin instructions. Take 60 mg by mouth AFTER each dialysis treatment on Tues/Thurs/Sat   doxercalciferol 4 MCG/2ML injection Commonly known as: HECTOROL Inject 1.5 mLs (3 mcg total) into the vein Every Tuesday,Thursday,and Saturday with dialysis.   midodrine 10 MG tablet Commonly known as: PROAMATINE Take 10 mg by mouth See admin instructions. Take 10 mg by mouth "just prior to  dialysis, then another 10 mg mid-treatment"- Tues/Thurs/Sat   ONE-A-DAY WOMENS PO Take 1 tablet by mouth daily with breakfast.   oxyCODONE-acetaminophen 5-325 MG tablet Commonly known as: PERCOCET/ROXICET Take 1 tablet by mouth every 6 (six) hours as needed for moderate pain.   polyethylene glycol 17 g packet Commonly known as: MIRALAX / GLYCOLAX Take 17 g by mouth daily. Start taking on: October 13, 2018   sevelamer carbonate 800 MG tablet Commonly known as: RENVELA Take 4 tablets (3,200 mg total) by mouth 3 (three) times daily with meals.   Tiotropium Bromide Monohydrate 2.5 MCG/ACT Aers Commonly known as: Spiriva Respimat Inhale 2 puffs into the lungs daily. What changed: how much to take       Allergies  Allergen Reactions  . Adhesive [Tape] Rash  . Lyrica [Pregabalin] Other (See Comments)    Hallucinations    Consultations:  Nephrology    Procedures/Studies: Dg Lumbar Spine Complete  Result Date: 10/05/2018 CLINICAL DATA:  Low back pain since yesterday morning. EXAM: LUMBAR SPINE - COMPLETE 4+ VIEW COMPARISON:  Abdominal CT 04/11/2015 FINDINGS: Generalized degenerative endplate spurring with disc narrowing mainly at L3-4 and L4-5. Endplate irregularity is chronic and degenerative when correlated with 2017 CT. Osteopenic appearance. Multifocal atherosclerotic calcification. IMPRESSION: 1. No acute finding. 2. Disc degeneration, especially at L3-4 and L4-5, with similar appearance to 2017 CT. Electronically Signed   By: Monte Fantasia M.D.   On: 10/05/2018  10:55   Mr Lumbar Spine Wo Contrast  Result Date: 10/05/2018 CLINICAL DATA:  Low back pain not responding to conservative therapy. Dialysis patient. EXAM: MRI LUMBAR SPINE WITHOUT CONTRAST TECHNIQUE: Multiplanar, multisequence MR imaging of the lumbar spine was performed. No intravenous contrast was administered. COMPARISON:  Radiography same day FINDINGS: Segmentation:  5 lumbar type vertebral bodies. Alignment:   Straightening of the normal lumbar lordosis. Vertebrae: No fracture or malignant-appearing bone lesion. Benign appearing fatty changes within the lower thoracic and lumbar spine. Discogenic endplate edema at [id number] and L4-5 that could contribute to back pain. Conus medullaris and cauda equina: Conus extends to the T12-L1 level. Conus and cauda equina appear normal. Paraspinal and other soft tissues: Fatty atrophy of the paraspinal musculature. Disc levels: Noncompressive disc bulges at L2-3 and above. L3-4: Moderate bulging of the disc. Mild facet and ligamentous prominence. Mild stenosis of both lateral recesses without definite neural compression. Some discogenic endplate marrow changes which could be associated with back pain. L4-5: Disc degeneration with broad-based herniation. Facet and ligamentous hypertrophy. Stenosis of both lateral recesses that could compress either or both L5 nerves. Discogenic endplate changes that could be associated with back pain. L5-S1: Mild bulging of the disc. Mild facet arthritis. No compressive stenosis. IMPRESSION: L4-5: Multifactorial spinal stenosis that could cause neural compression on either or both sides. Broad-based disc herniation in combination with facet and ligamentous hypertrophy. L3-4: Mild bilateral lateral recess stenosis because of bulging of the disc and mild facet hypertrophy. No definite neural compression. Discogenic endplate marrow changes at L3-4 and L4-5 which could be associated with back pain. Electronically Signed   By: Nelson Chimes M.D.   On: 10/05/2018 17:42   Dg Chest Port 1 View  Result Date: 10/05/2018 CLINICAL DATA:  Shortness of breath, dialysis EXAM: PORTABLE CHEST 1 VIEW COMPARISON:  06/20/2018 FINDINGS: Cardiomegaly. Mild, diffuse bilateral interstitial pulmonary opacity. The visualized skeletal structures are unremarkable. IMPRESSION: Cardiomegaly with mild, diffuse bilateral interstitial pulmonary opacity, likely edema. No focal airspace  opacity. Electronically Signed   By: Eddie Candle M.D.   On: 10/05/2018 13:12      Procedures:   Subjective: Patient is feeling better, her back pain is not back to baseline but improved, no nausea or vomiting, no dyspnea or chest pain.   Discharge Exam: Vitals:   10/12/18 0815 10/12/18 0825  BP: (!) 114/51   Pulse: 64   Resp: 16   Temp: (!) 96.8 F (36 C)   SpO2: 97% 97%   Vitals:   10/11/18 2321 10/12/18 0100 10/12/18 0815 10/12/18 0825  BP: (!) 125/49  (!) 114/51   Pulse: 62 63 64   Resp: 20 (!) 23 16   Temp: 98.6 F (37 C)  (!) 96.8 F (36 C)   TempSrc:   Axillary   SpO2: 97% 98% 97% 97%  Weight:      Height:        General: Not in pain or dyspnea.  Neurology: Awake and alert, non focal  E ENT: no pallor, no icterus, oral mucosa moist Cardiovascular: No JVD. S1-S2 present, rhythmic, no gallops, rubs, or murmurs. No lower extremity edema. Pulmonary: positive breath sounds bilaterally, adequate air movement, no wheezing, rhonchi or rales. Gastrointestinal. Abdomen with no organomegaly, non tender, no rebound or guarding Skin. No rashes Musculoskeletal: no joint deformities   The results of significant diagnostics from this hospitalization (including imaging, microbiology, ancillary and laboratory) are listed below for reference.     Microbiology: Recent Results (from the  past 240 hour(s))  SARS Coronavirus 2 [facility] order, Performed in [facility] hospital lab) Nasopharyngeal Nasopharyngeal Swab     Status: None   Collection Time: 10/05/18  1:52 PM   Specimen: Nasopharyngeal Swab  Result Value Ref Range Status   SARS Coronavirus 2 NEGATIVE NEGATIVE Final    Comment: (NOTE) If result is NEGATIVE SARS-CoV-2 target nucleic acids are NOT DETECTED. The SARS-CoV-2 RNA is generally detectable in upper and lower  respiratory specimens during the acute phase of infection. The lowest  concentration of SARS-CoV-2 viral copies this assay can detect is 250  copies /  mL. A negative result does not preclude SARS-CoV-2 infection  and should not be used as the sole basis for treatment or other  patient management decisions.  A negative result may occur with  improper specimen collection / handling, submission of specimen other  than nasopharyngeal swab, presence of viral mutation(s) within the  areas targeted by this assay, and inadequate number of viral copies  (<250 copies / mL). A negative result must be combined with clinical  observations, patient history, and epidemiological information. If result is POSITIVE SARS-CoV-2 target nucleic acids are DETECTED. The SARS-CoV-2 RNA is generally detectable in upper and lower  respiratory specimens dur ing the acute phase of infection.  Positive  results are indicative of active infection with SARS-CoV-2.  Clinical  correlation with patient history and other diagnostic information is  necessary to determine patient infection status.  Positive results do  not rule out bacterial infection or co-infection with other viruses. If result is PRESUMPTIVE POSTIVE SARS-CoV-2 nucleic acids MAY BE PRESENT.   A presumptive positive result was obtained on the submitted specimen  and confirmed on repeat testing.  While 2019 novel coronavirus  (SARS-CoV-2) nucleic acids may be present in the submitted sample  additional confirmatory testing may be necessary for epidemiological  and / or clinical management purposes  to differentiate between  SARS-CoV-2 and other Sarbecovirus currently known to infect humans.  If clinically indicated additional testing with an alternate test  methodology [phone]) is advised. The SARS-CoV-2 RNA is generally  detectable in upper and lower respiratory sp ecimens during the acute  phase of infection. The expected result is Negative. Fact Sheet for Patients:  StrictlyIdeas.no Fact Sheet for Healthcare Providers: BankingDealers.co.za This test is  not yet approved or cleared by the [place] FDA and has been authorized for detection and/or diagnosis of SARS-CoV-2 by FDA under an Emergency Use Authorization (EUA).  This EUA will remain in effect (meaning this test can be used) for the duration of the COVID-19 declaration under Section 564(b)(1) of the Act, 21 U.S.C. section 360bbb-3(b)(1), unless the authorization is terminated or revoked sooner. Performed at [facility], [place] [address], [place], [place] [address]   SARS CORONAVIRUS 2 (TAT 6-24 HRS) Nasopharyngeal Nasopharyngeal Swab     Status: None   Collection Time: 10/11/18 11:24 AM   Specimen: Nasopharyngeal Swab  Result Value Ref Range Status   SARS Coronavirus 2 NEGATIVE NEGATIVE Final    Comment: (NOTE) SARS-CoV-2 target nucleic acids are NOT DETECTED. The SARS-CoV-2 RNA is generally detectable in upper and lower respiratory specimens during the acute phase of infection. Negative results do not preclude SARS-CoV-2 infection, do not rule out co-infections with other pathogens, and should not be used as the sole basis for treatment or other patient management decisions. Negative results must be combined with clinical observations, patient history, and epidemiological information. The expected result is Negative. Fact Sheet  for Patients: SugarRoll.be Fact Sheet for Healthcare Providers: https://www.woods-mathews.com/ This test is not yet approved or cleared by the [place] FDA and  has been authorized for detection and/or diagnosis of SARS-CoV-2 by FDA under an Emergency Use Authorization (EUA). This EUA will remain  in effect (meaning this test can be used) for the duration of the COVID-19 declaration under Section 56 4(b)(1) of the Act, 21 U.S.C. section 360bbb-3(b)(1), unless the authorization is terminated or revoked sooner. Performed at [name], [place] [address] [place], [place] [address]       Labs: BNP (last 3 results) Recent Labs    01/16/18 1825 06/17/18 1806  BNP 118.7* [id number]*   Basic Metabolic Panel: Recent Labs  Lab 10/05/18 1104 10/06/18 0525 10/07/18 0433 10/08/18 0421 10/09/18 0831  NA 140 137 137 134* 131*  K 5.4* 5.2* 4.9 4.7 4.5  CL 93* 95* 91* 96* 92*  CO2 28 27 29 25 23   GLUCOSE 118* 141* 107* 104* 101*  BUN 62* 32* 53* 36* 57*  CREATININE 13.11* 8.23* 10.68* 8.21* 11.18*  CALCIUM 9.4 8.7* 9.1 9.1 9.5  PHOS  --   --   --   --  6.9*   Liver Function Tests: Recent Labs  Lab 10/05/18 1104 10/09/18 0831  AST 18  --   ALT 13  --   ALKPHOS 52  --   BILITOT 0.7  --   PROT 7.6  --   ALBUMIN 3.4* 2.8*   No results for input(s): LIPASE, AMYLASE in the last 168 hours. No results for input(s): AMMONIA in the last 168 hours. CBC: Recent Labs  Lab 10/05/18 1104 10/06/18 0525 10/07/18 0433 10/09/18 0831  WBC 6.0 5.5 6.8 7.7  NEUTROABS  --   --  3.5  --   HGB 11.6* 11.1* 11.3* 10.5*  HCT 35.8* 35.1* 34.1* 33.4*  MCV 90.4 90.7 90.9 91.3  PLT 207 224 212 207   Cardiac Enzymes: No results for input(s): CKTOTAL, CKMB, CKMBINDEX, TROPONINI in the last 168 hours. BNP: Invalid input(s): POCBNP CBG: Recent Labs  Lab 10/11/18 0756 10/11/18 1158 10/11/18 1620 10/11/18 2034 10/12/18 0819  GLUCAP 85 145* 122* 144* 72   D-Dimer No results for input(s): DDIMER in the last 72 hours. Hgb A1c No results for input(s): HGBA1C in the last 72 hours. Lipid Profile No results for input(s): CHOL, HDL, LDLCALC, TRIG, CHOLHDL, LDLDIRECT in the last 72 hours. Thyroid function studies No results for input(s): TSH, T4TOTAL, T3FREE, THYROIDAB in the last 72 hours.  Invalid input(s): FREET3 Anemia work up No results for input(s): VITAMINB12, FOLATE, FERRITIN, TIBC, IRON, RETICCTPCT in the last 72 hours. Urinalysis    Component Value Date/Time   COLORURINE YELLOW 07/02/2010 2127   APPEARANCEUR CLOUDY (A) 07/02/2010 2127   LABSPEC 1.011 07/02/2010 2127    PHURINE 6.5 07/02/2010 2127   GLUCOSEU 250 (A) 07/02/2010 2127   HGBUR SMALL (A) 07/02/2010 2127   BILIRUBINUR NEGATIVE 07/02/2010 2127   KETONESUR NEGATIVE 07/02/2010 2127   PROTEINUR >300 (A) 07/02/2010 2127   UROBILINOGEN 0.2 07/02/2010 2127   NITRITE NEGATIVE 07/02/2010 2127   LEUKOCYTESUR LARGE (A) 07/02/2010 2127   Sepsis Labs Invalid input(s): PROCALCITONIN,  WBC,  LACTICIDVEN Microbiology Recent Results (from the past 240 hour(s))  SARS Coronavirus 2 [facility] order, Performed in [facility] hospital lab) Nasopharyngeal Nasopharyngeal Swab     Status: None   Collection Time: 10/05/18  1:52 PM   Specimen: Nasopharyngeal Swab  Result Value Ref Range Status  SARS Coronavirus 2 NEGATIVE NEGATIVE Final    Comment: (NOTE) If result is NEGATIVE SARS-CoV-2 target nucleic acids are NOT DETECTED. The SARS-CoV-2 RNA is generally detectable in upper and lower  respiratory specimens during the acute phase of infection. The lowest  concentration of SARS-CoV-2 viral copies this assay can detect is 250  copies / mL. A negative result does not preclude SARS-CoV-2 infection  and should not be used as the sole basis for treatment or other  patient management decisions.  A negative result may occur with  improper specimen collection / handling, submission of specimen other  than nasopharyngeal swab, presence of viral mutation(s) within the  areas targeted by this assay, and inadequate number of viral copies  (<250 copies / mL). A negative result must be combined with clinical  observations, patient history, and epidemiological information. If result is POSITIVE SARS-CoV-2 target nucleic acids are DETECTED. The SARS-CoV-2 RNA is generally detectable in upper and lower  respiratory specimens dur ing the acute phase of infection.  Positive  results are indicative of active infection with SARS-CoV-2.  Clinical  correlation with patient history and other diagnostic information is   necessary to determine patient infection status.  Positive results do  not rule out bacterial infection or co-infection with other viruses. If result is PRESUMPTIVE POSTIVE SARS-CoV-2 nucleic acids MAY BE PRESENT.   A presumptive positive result was obtained on the submitted specimen  and confirmed on repeat testing.  While 2019 novel coronavirus  (SARS-CoV-2) nucleic acids may be present in the submitted sample  additional confirmatory testing may be necessary for epidemiological  and / or clinical management purposes  to differentiate between  SARS-CoV-2 and other Sarbecovirus currently known to infect humans.  If clinically indicated additional testing with an alternate test  methodology [phone]) is advised. The SARS-CoV-2 RNA is generally  detectable in upper and lower respiratory sp ecimens during the acute  phase of infection. The expected result is Negative. Fact Sheet for Patients:  StrictlyIdeas.no Fact Sheet for Healthcare Providers: BankingDealers.co.za This test is not yet approved or cleared by the [place] FDA and has been authorized for detection and/or diagnosis of SARS-CoV-2 by FDA under an Emergency Use Authorization (EUA).  This EUA will remain in effect (meaning this test can be used) for the duration of the COVID-19 declaration under Section 564(b)(1) of the Act, 21 U.S.C. section 360bbb-3(b)(1), unless the authorization is terminated or revoked sooner. Performed at [facility], [place] [address], [place], [place] [address]   SARS CORONAVIRUS 2 (TAT 6-24 HRS) Nasopharyngeal Nasopharyngeal Swab     Status: None   Collection Time: 10/11/18 11:24 AM   Specimen: Nasopharyngeal Swab  Result Value Ref Range Status   SARS Coronavirus 2 NEGATIVE NEGATIVE Final    Comment: (NOTE) SARS-CoV-2 target nucleic acids are NOT DETECTED. The SARS-CoV-2 RNA is generally detectable in upper and lower respiratory specimens  during the acute phase of infection. Negative results do not preclude SARS-CoV-2 infection, do not rule out co-infections with other pathogens, and should not be used as the sole basis for treatment or other patient management decisions. Negative results must be combined with clinical observations, patient history, and epidemiological information. The expected result is Negative. Fact Sheet for Patients: SugarRoll.be Fact Sheet for Healthcare Providers: https://www.woods-mathews.com/ This test is not yet approved or cleared by the [place] FDA and  has been authorized for detection and/or diagnosis of SARS-CoV-2 by FDA under an Emergency Use Authorization (EUA). This EUA will remain  in effect (meaning this test can be used) for the duration of the COVID-19 declaration under Section 56 4(b)(1) of the Act, 21 U.S.C. section 360bbb-3(b)(1), unless the authorization is terminated or revoked sooner. Performed at [facility], [place] [address], [place], [place] [address]      Time coordinating discharge: 45 minutes  SIGNED:   Tawni Millers, MD  [facility] 10/12/2018, 9:18 AM

## 2018-10-12 NOTE — TOC Transition Note (Signed)
Transition of Care [facility]) - CM/SW Discharge Note   Patient Details  Name: [name] MRN: [id number] Date of Birth: 12/26/1945  Transition of Care [facility]) CM/SW Contact:  Geralynn Ochs, LCSW Phone Number: 10/12/2018, 9:23 AM   Clinical Narrative:   Nurse to call report to [phone].   Patient will be transported from the hospital to dialysis, pick up around 11:00 am. Miquel Dunn to pick up from dialysis.    Final next level of care: Skilled Nursing Facility Barriers to Discharge: Barriers Resolved   Patient Goals and CMS Choice Patient states their goals for this hospitalization and ongoing recovery are:: Pt would like to get stronger CMS Medicare.gov Compare Post Acute Care list provided to:: Patient Choice offered to / list presented to : Patient  Discharge Placement              Patient chooses bed at: [facility] Patient to be transferred to facility by: [place] Name of family member notified: Self Patient and family notified of of transfer: 10/12/18  Discharge Plan and Services In-house Referral: Clinical Social Work Discharge Planning Services: NA Post Acute Care Choice: [place]          DME Arranged: N/A DME Agency: NA       HH Arranged: NA [place] Agency: NA        Social Determinants of Health (SDOH) Interventions     Readmission Risk Interventions Readmission Risk Prevention Plan 06/23/2018 06/22/2018 06/21/2018  Transportation Screening - - Complete  PCP or Specialist Appt within 3-5 Days - Complete -  [place] or [place] - - Complete  Social Work Consult for [place] Planning/Counseling - - Complete  Palliative Care Screening - Not Applicable -  Medication Review Press photographer) - - Complete  PCP or Specialist appointment within 3-5 days of discharge Complete - -  [place] or Home Care Consult Complete - -  SW Recovery Care/Counseling Consult Complete - -  Palliative Care Screening Not Applicable - -  Skilled Nursing  Facility Complete - -  Some recent data might be hidden

## 2018-10-12 NOTE — Progress Notes (Signed)
Report given by this nurse to [facility] RN at [facility].

## 2018-10-12 NOTE — Plan of Care (Signed)

## 2018-10-16 DIAGNOSIS — N186 End stage renal disease: Secondary | ICD-10-CM | POA: Diagnosis not present

## 2018-10-16 DIAGNOSIS — J9601 Acute respiratory failure with hypoxia: Secondary | ICD-10-CM | POA: Diagnosis not present

## 2018-10-16 DIAGNOSIS — E8779 Other fluid overload: Secondary | ICD-10-CM | POA: Diagnosis not present

## 2018-10-16 DIAGNOSIS — M48061 Spinal stenosis, lumbar region without neurogenic claudication: Secondary | ICD-10-CM | POA: Diagnosis not present

## 2018-10-20 DIAGNOSIS — E119 Type 2 diabetes mellitus without complications: Secondary | ICD-10-CM | POA: Diagnosis not present

## 2018-10-20 DIAGNOSIS — E875 Hyperkalemia: Secondary | ICD-10-CM | POA: Diagnosis not present

## 2018-10-20 DIAGNOSIS — J449 Chronic obstructive pulmonary disease, unspecified: Secondary | ICD-10-CM | POA: Diagnosis not present

## 2018-10-20 DIAGNOSIS — J9601 Acute respiratory failure with hypoxia: Secondary | ICD-10-CM | POA: Diagnosis not present

## 2018-10-29 DIAGNOSIS — M48061 Spinal stenosis, lumbar region without neurogenic claudication: Secondary | ICD-10-CM | POA: Diagnosis not present

## 2018-10-29 DIAGNOSIS — M5442 Lumbago with sciatica, left side: Secondary | ICD-10-CM | POA: Diagnosis not present

## 2018-10-29 DIAGNOSIS — M5441 Lumbago with sciatica, right side: Secondary | ICD-10-CM | POA: Diagnosis not present

## 2018-10-29 DIAGNOSIS — J9601 Acute respiratory failure with hypoxia: Secondary | ICD-10-CM | POA: Diagnosis not present

## 2018-12-31 ENCOUNTER — Other Ambulatory Visit: Payer: Self-pay

## 2018-12-31 ENCOUNTER — Emergency Department (HOSPITAL_COMMUNITY)
Admission: EM | Admit: 2018-12-31 | Discharge: 2018-12-31 | Disposition: A | Discharge status: Home / Self Care | Payer: Medicare Other | Attending: Emergency Medicine | Admitting: Emergency Medicine

## 2018-12-31 ENCOUNTER — Emergency Department (HOSPITAL_COMMUNITY): Payer: Medicare Other

## 2018-12-31 DIAGNOSIS — I5032 Chronic diastolic (congestive) heart failure: Secondary | ICD-10-CM | POA: Insufficient documentation

## 2018-12-31 DIAGNOSIS — U071 COVID-19: Secondary | ICD-10-CM | POA: Diagnosis present

## 2018-12-31 DIAGNOSIS — Z87891 Personal history of nicotine dependence: Secondary | ICD-10-CM | POA: Diagnosis not present

## 2018-12-31 DIAGNOSIS — Z79899 Other long term (current) drug therapy: Secondary | ICD-10-CM | POA: Diagnosis not present

## 2018-12-31 DIAGNOSIS — Z7982 Long term (current) use of aspirin: Secondary | ICD-10-CM | POA: Diagnosis not present

## 2018-12-31 DIAGNOSIS — I132 Hypertensive heart and chronic kidney disease with heart failure and with stage 5 chronic kidney disease, or end stage renal disease: Secondary | ICD-10-CM | POA: Insufficient documentation

## 2018-12-31 DIAGNOSIS — E119 Type 2 diabetes mellitus without complications: Secondary | ICD-10-CM | POA: Diagnosis not present

## 2018-12-31 DIAGNOSIS — N186 End stage renal disease: Secondary | ICD-10-CM | POA: Insufficient documentation

## 2018-12-31 DIAGNOSIS — I2510 Atherosclerotic heart disease of native coronary artery without angina pectoris: Secondary | ICD-10-CM | POA: Diagnosis not present

## 2018-12-31 DIAGNOSIS — R05 Cough: Secondary | ICD-10-CM | POA: Insufficient documentation

## 2018-12-31 LAB — CBC WITH DIFFERENTIAL/PLATELET
Abs Immature Granulocytes: 0.01 10*3/uL (ref 0.00–0.07)
Basophils Absolute: 0 10*3/uL (ref 0.0–0.1)
Basophils Relative: 1 %
Eosinophils Absolute: 0.1 10*3/uL (ref 0.0–0.5)
Eosinophils Relative: 3 %
HCT: 27.3 % — ABNORMAL LOW (ref 36.0–46.0)
Hemoglobin: 8.6 g/dL — ABNORMAL LOW (ref 12.0–15.0)
Immature Granulocytes: 0 %
Lymphocytes Relative: 42 %
Lymphs Abs: 1.6 10*3/uL (ref 0.7–4.0)
MCH: 27.9 pg (ref 26.0–34.0)
MCHC: 31.5 g/dL (ref 30.0–36.0)
MCV: 88.6 fL (ref 80.0–100.0)
Monocytes Absolute: 0.4 10*3/uL (ref 0.1–1.0)
Monocytes Relative: 11 %
Neutro Abs: 1.6 10*3/uL — ABNORMAL LOW (ref 1.7–7.7)
Neutrophils Relative %: 43 %
Platelets: 174 10*3/uL (ref 150–400)
RBC: 3.08 MIL/uL — ABNORMAL LOW (ref 3.87–5.11)
RDW: 16.1 % — ABNORMAL HIGH (ref 11.5–15.5)
WBC: 3.7 10*3/uL — ABNORMAL LOW (ref 4.0–10.5)
nRBC: 0 % (ref 0.0–0.2)

## 2018-12-31 LAB — BASIC METABOLIC PANEL
Anion gap: 16 — ABNORMAL HIGH (ref 5–15)
BUN: 43 mg/dL — ABNORMAL HIGH (ref 8–23)
CO2: 26 mmol/L (ref 22–32)
Calcium: 8.3 mg/dL — ABNORMAL LOW (ref 8.9–10.3)
Chloride: 96 mmol/L — ABNORMAL LOW (ref 98–111)
Creatinine, Ser: 8.98 mg/dL — ABNORMAL HIGH (ref 0.44–1.00)
GFR calc Af Amer: 5 mL/min — ABNORMAL LOW (ref >60)
GFR calc non Af Amer: 4 mL/min — ABNORMAL LOW (ref >60)
Glucose, Bld: 76 mg/dL (ref 70–99)
Potassium: 3.8 mmol/L (ref 3.5–5.1)
Sodium: 138 mmol/L (ref 135–145)

## 2018-12-31 NOTE — ED Notes (Addendum)
Pt advised she was feeling short of breath when ambulating. SPO2 of 92 percent while ambulating. Pt was not in distress to breathe when ambulating however, pt did advise she felt out of breath

## 2018-12-31 NOTE — ED Provider Notes (Signed)
[name] EMERGENCY DEPARTMENT Provider Note   CSN: [id number] Arrival date & time: 12/31/18  1813     History Chief Complaint  Patient presents with  . Covid positive    [name] is a 73 y.o. female with past medical history significant for CAD, CHF, blind in right eye, CKD on dialysis, Tuesday Thursday Saturday, compliant, diabetes who presents for evaluation of Covid positive status.  Patient recently brought her to ED by daughter who states she does want to get her "checked out."  Patient's dialysis facility does have Covid patients and daughter was concerned patient has had a chronic cough x3 weeks.  Patient arrives with paperwork from [name] which states patient tested positive for Covid and influenza.  Patient states she has had nonproductive cough.  Denies fever, chills, nausea, vomiting, chest pain, shortness of breath, abdominal pain.  She did have 1 episode of diarrhea without melena hematochezia earlier today after eating a salad.  Patient states she cannot taste has overall had a decreased appetite.  Denies any pain.  Denies additional aggravating relieving factors.  She denies any changes in her weight and has been compliant with her dialysis.  She is set to go tomorrow morning.   History obtained from patient past medical records.  No interpreter is used.  HPI     Past Medical History:  Diagnosis Date  . Abdominal abscess 12-17-10   abdominal abscesses x2 ? one at this time  . Anemia   . Arthritis    "all over" (08/23/2012)  . Blind right eye    fell and crushed socket and eye fell out, was replaced at [facility]  . CHF (congestive heart failure) ([place])   . Chronic lower back pain   . Coronary artery disease    normal coronaries by 08/25/12 cath  . Dyspnea   . Dysrhythmia    irregular, "skips beats"  . ESRD (end stage renal disease) on dialysis ([place])    "started 12/2011; [place]; TTS" (08/23/2012  . Exertional shortness of  breath   . Eye drainage    "lots; since fall 07/2011" (08/23/2012)  . Gout   . H/O hiatal hernia   . Headache(784.0)    "weekly sometimes; since I fell and hit my head in 07/2011 08/23/2012)  . Heart murmur   . History of blood transfusion    "lots before starting dialysis" (08/23/2012)  . Hypertension    sees Dr. Willey Blade  . Inhalation injury    "worked at [employer]; can't inhale polyurethane or paint, etc" (08/23/2012)  . Myocardial infarction ([place]) 1970's or 1980's  . Neuromuscular disorder ([place])    diabetic neuropathy  . OSA on CPAP   . Staph aureus infection November 2012   . Swelling of both ankles 12-17-10  . Type II diabetes mellitus ([place])    "over 10 years now" (08/23/2012)    Patient Active Problem List   Diagnosis Date Noted  . Fluid overload 10/05/2018  . Shortness of breath 06/17/2018  . Acute left systolic heart failure ([place]) 06/17/2018  . Eye pain, left 01/25/2018  . Acute bronchitis due to Rhinovirus 01/18/2018  . Respiratory distress, acute 01/18/2018  . Essential hypertension 01/17/2018  . HLD (hyperlipidemia) 01/17/2018  . Chronic diastolic CHF (congestive heart failure) ([place]) 01/16/2018  . Centrilobular emphysema ([place]) 01/16/2018  . Acute bronchitis 01/16/2018  . Heme + stool 08/24/2017  . Iron deficiency anemia, unspecified 08/24/2017  . Chest pain 04/06/2017  . Unstable  angina ([place]) 04/02/2017  . Hypoxemia 10/13/2016  . Acute on chronic combined systolic and diastolic CHF (congestive heart failure) ([place]) 10/13/2016  . Anemia due to chronic kidney disease 10/13/2016  . Encephalopathy, metabolic   . Tremor   . Weakness   . Acute hyperkalemia 12/04/2014  . Neurological deficit present 12/04/2014  . Hypercalcemia 12/04/2014  . OSA    . Myocardial infarction ([place])   . Blind right eye   . Coronary artery disease   . CHF (congestive heart failure) ([place])   . ESRD needing dialysis ([place])   . Hyperkalemia 08/05/2014  . Type II diabetes mellitus with  renal manifestations ([place]) 01/18/2014  . Arm pain 02/27/2012  . Other complications due to renal dialysis device, implant, and graft 02/13/2012  . Pain in limb 11/21/2011  . Swollen limb 11/21/2011  . Itching 11/21/2011  . End stage renal disease ([place]) 11/07/2011  . Chronic kidney disease, stage IV (severe) ([place]) 10/24/2011  . Chronic kidney disease (CKD), stage IV (severe) ([place]) 08/29/2011    Past Surgical History:  Procedure Laterality Date  . [place] VITRECTOMY WITH 20 GAUGE MVR PORT Left 05/07/2017   Procedure: 25 GAUGE PARS PLANA VITRECTOMY WITH ENDOLASER LEFT EYE;  Surgeon: Bernarda Caffey, MD;  Location: [place];  Service: Ophthalmology;  Laterality: Left;  . APPENDECTOMY    . AV FISTULA PLACEMENT  09/30/2011   Procedure: ARTERIOVENOUS (AV) FISTULA CREATION;  Surgeon: Conrad [place], MD;  Location: [place];  Service: Vascular;  Laterality: Left;  BRACHIAL-CEPHALIC  . BACK SURGERY    . BASCILIC VEIN TRANSPOSITION Left 11/10/2011   Left arm   . [place] TRANSPOSITION  01/28/2012   Procedure: BASCILIC VEIN TRANSPOSITION;  Surgeon: Conrad Kirvin, MD;  Location: [place] [place];  Service: Vascular;  Laterality: Left;  Second Stage   . BIOPSY  08/24/2017   Procedure: BIOPSY;  Surgeon: Wilford Corner, MD;  Location: [facility];  Service: Endoscopy;;  . CARDIAC CATHETERIZATION  1980's  . CATARACT EXTRACTION W/ INTRAOCULAR LENS IMPLANT Bilateral 1990's  . COLONOSCOPY WITH PROPOFOL N/A 08/24/2017   Procedure: COLONOSCOPY WITH PROPOFOL;  Surgeon: Wilford Corner, MD;  Location: [facility];  Service: Endoscopy;  Laterality: N/A;  . CORONARY ANGIOGRAPHY N/A 04/07/2017   Procedure: CORONARY ANGIOGRAPHY (CATH LAB);  Surgeon: Dixie Dials, MD;  Location: [place] CV LAB;  Service: Cardiovascular;  Laterality: N/A;  . DILATION AND CURETTAGE OF UTERUS  1960's  . ESOPHAGOGASTRODUODENOSCOPY (EGD) WITH PROPOFOL N/A 08/24/2017   Procedure: ESOPHAGOGASTRODUODENOSCOPY (EGD) WITH PROPOFOL;  Surgeon:  Wilford Corner, MD;  [facility]: [facility];  Service: Endoscopy;  Laterality: N/A;  . EYE SURGERY    . FISTULA SUPERFICIALIZATION Left 02/18/2017   Procedure: SUPERFICIALIZATION LEFT ARM ARTERIOVENOUS FISTULA;  Surgeon: Serafina Mitchell, MD;  Location: [facility];  Service: Vascular;  Laterality: Left;  . HERNIA REPAIR    . INSERTION OF DIALYSIS CATHETER  01/05/2012   Procedure: INSERTION OF DIALYSIS CATHETER;  Surgeon: Conrad Blanchard, MD;  Location: [place];  Service: Vascular;  Laterality: N/A;  Right Internal Jugular Placement  . INTRAOCULAR PROSTHESES INSERTION Right 2013   "fell; knocked my eye outl" (08/23/2012)  . JOINT REPLACEMENT    . LAPAROTOMY  12/18/2010   Procedure: EXPLORATORY LAPAROTOMY;  Surgeon: Rolm Bookbinder, MD;  Location: [facility];  Service: General;  Laterality: N/A;  Abdominal Seroma Evacuation  . LEFT HEART CATHETERIZATION WITH CORONARY ANGIOGRAM N/A 08/25/2012   Procedure: LEFT HEART CATHETERIZATION WITH CORONARY ANGIOGRAM;  Surgeon: Lorenda Ishihara  Doylene Canard, MD;  Location: [place] CATH LAB;  Service: Cardiovascular;  Laterality: N/A;  . LUMBAR DISC SURGERY  1970'-80's   X 3  . TOTAL KNEE ARTHROPLASTY Left 1980's  . VAGINAL HYSTERECTOMY  1970's  . VENTRAL HERNIA REPAIR  2012-2013   component separation, repair with biologic; "had 3 surgeries to fix it" (08/23/2012)  . WOUND DEBRIDEMENT  03/20/2011   Procedure: DEBRIDEMENT ABDOMINAL WOUND;  Surgeon: Rolm Bookbinder, MD;  Location: [place];  Service: General;  Laterality: N/A;  debridement abdominal wall, placement of wound vac     OB History   No obstetric history on file.     Family History  Problem Relation Age of Onset  . Hypertension Mother   . Cancer Brother        spine  . Hypertension Father   . Cancer Sister        brain  . Hypertension Maternal Grandmother   . Anesthesia problems Neg Hx     Social History   Tobacco Use  . Smoking status: Former Smoker    Packs/day: 0.50    Years: 20.00    Pack years: 10.00     Types: Cigarettes    Quit date: 12/16/1980    Years since quitting: 38.0  . Smokeless tobacco: Never Used  Substance Use Topics  . Alcohol use: No  . Drug use: No    Home Medications Prior to Admission medications   Medication Sig Start Date End Date Taking? Authorizing Provider  acetaminophen (TYLENOL) 325 MG tablet Take 2 tablets (650 mg total) by mouth every 6 (six) hours as needed for mild pain (or Fever >/= 101). 10/12/18   Arrien, Jimmy Picket, MD  albuterol (PROVENTIL) (2.5 MG/3ML) 0.083% nebulizer solution Take 3 mLs (2.5 mg total) by nebulization every 4 (four) hours as needed for shortness of breath. 06/22/18   Dixie Dials, MD  allopurinol (ZYLOPRIM) 100 MG tablet Take 0.5 tablets (50 mg total) by mouth daily. Patient taking differently: Take 50 mg by mouth daily as needed (for gout flares).  06/23/18   Dixie Dials, MD  aspirin EC 81 MG tablet Take 81 mg by mouth daily.      [provider]  atorvastatin (LIPITOR) 40 MG tablet Take 40 mg by mouth daily.    [provider]  budesonide-formoterol (SYMBICORT) 160-4.5 MCG/ACT inhaler Inhale 2 puffs into the lungs 2 (two) times daily. Patient not taking: Reported on 10/05/2018 06/07/18   Lauraine Rinne, NP  cinacalcet (SENSIPAR) 60 MG tablet Take 60 mg by mouth See admin instructions. Take 60 mg by mouth AFTER each dialysis treatment on Tues/Thurs/Sat    [provider]  doxercalciferol (HECTOROL) 4 MCG/2ML injection Inject 1.5 mLs (3 mcg total) into the vein Every Tuesday,Thursday,and Saturday with dialysis. 06/22/18   Dixie Dials, MD  midodrine (PROAMATINE) 10 MG tablet Take 10 mg by mouth See admin instructions. Take 10 mg by mouth "just prior to dialysis, then another 10 mg mid-treatment"- Tues/Thurs/Sat 04/26/17   [provider]  Multiple Vitamins-Minerals (ONE-A-DAY WOMENS PO) Take 1 tablet by mouth daily with breakfast.    [provider]  oxyCODONE-acetaminophen (PERCOCET/ROXICET) 5-325  MG tablet Take 1 tablet by mouth every 6 (six) hours as needed for moderate pain. 10/12/18   Arrien, Jimmy Picket, MD  polyethylene glycol (MIRALAX / GLYCOLAX) 17 g packet Take 17 g by mouth daily. 10/13/18   Arrien, Jimmy Picket, MD  sevelamer carbonate (RENVELA) 800 MG tablet Take 4 tablets (3,200 mg total) by  mouth 3 (three) times daily with meals. 06/22/18   Dixie Dials, MD  Tiotropium Bromide Monohydrate (SPIRIVA RESPIMAT) 2.5 MCG/ACT AERS Inhale 2 puffs into the lungs daily. Patient taking differently: Inhale 1 puff into the lungs daily.  06/07/18   Lauraine Rinne, NP    Allergies    Adhesive [tape] and Lyrica [pregabalin]  Review of Systems   Review of Systems  Constitutional: Positive for appetite change. Negative for chills, diaphoresis, fatigue and fever.  HENT: Negative.   Respiratory: Negative.   Cardiovascular: Negative.   Gastrointestinal: Positive for diarrhea (1 episode without melena or BRBPR).  Genitourinary: Negative.   Musculoskeletal: Negative.   Skin: Negative.   Neurological: Negative.   All other systems reviewed and are negative.  Physical Exam Updated Vital Signs BP (!) 158/57 (BP Location: Right Arm)   Pulse 67   Temp 98.6 F (37 C) (Oral)   Resp (!) 23   Ht 5\' 6"  (1.676 m)   Wt 121 kg   SpO2 94%   BMI 43.06 kg/m   Physical Exam Vitals and nursing note reviewed.  Constitutional:      General: She is not in acute distress.    Appearance: She is well-developed.  HENT:     Head: Normocephalic and atraumatic.     Jaw: There is normal jaw occlusion.     Mouth/Throat:     Comments: Mucous membranes moist Eyes:     Pupils: Pupils are equal, round, and reactive to light.  Cardiovascular:     Rate and Rhythm: Normal rate.     Pulses: Normal pulses.          Dorsalis pedis pulses are 2+ on the right side and 2+ on the left side.       Posterior tibial pulses are 2+ on the right side and 2+ on the left side.     Heart sounds: Normal heart sounds.   Pulmonary:     Effort: Pulmonary effort is normal. No respiratory distress.     Breath sounds: Normal breath sounds and air entry.     Comments: Speaks in full sentences without difficulty.  Lungs clear to auscultation Abdominal:     General: Bowel sounds are normal. There is no distension.     Tenderness: There is no abdominal tenderness.     Comments: Soft, nontender.  Musculoskeletal:        General: Normal range of motion.     Cervical back: Normal range of motion.     Comments: LAVF + bruit.  Moves 4 extremities without difficulty  Feet:     Right foot:     Skin integrity: Dry skin present.     Toenail Condition: Right toenails are abnormally thick and long.     Left foot:     Skin integrity: Dry skin present.     Toenail Condition: Left toenails are abnormally thick and long.  Skin:    General: Skin is warm and dry.     Capillary Refill: Capillary refill takes less than 2 seconds.     Comments: Trace pitting edema to anterior shin dimension.  Neurological:     Mental Status: She is alert.     Comments: Intact sensation bilaterally.  No facial droop.     ED Results / Procedures / Treatments   Labs (all labs ordered are listed, but only abnormal results are displayed) Labs Reviewed  CBC WITH DIFFERENTIAL/PLATELET - Abnormal; Notable for the following components:      Result  Value   WBC 3.7 (*)    RBC 3.08 (*)    Hemoglobin 8.6 (*)    HCT 27.3 (*)    RDW 16.1 (*)    Neutro Abs 1.6 (*)    All other components within normal limits  BASIC METABOLIC PANEL - Abnormal; Notable for the following components:   Chloride 96 (*)    BUN 43 (*)    Creatinine, Ser 8.98 (*)    Calcium 8.3 (*)    GFR calc non Af Amer 4 (*)    GFR calc Af Amer 5 (*)    Anion gap 16 (*)    All other components within normal limits    EKG EKG Interpretation  Date/Time:  Friday December 31 2018 21:21:41 EST Ventricular Rate:  59 PR Interval:  230 QRS Duration: 92 QT Interval:  516 QTC  Calculation: 510 R Axis:   -70 Text Interpretation: Sinus bradycardia with sinus arrhythmia with 1st degree A-V block Pulmonary disease pattern Left anterior fascicular block Prolonged QT Abnormal ECG when compared to prior, similar qtc prolongation. No STEMI Confirmed by Antony Blackbird [phone]) on 12/31/2018 10:06:03 PM   Radiology DG Chest Portable 1 View  Result Date: 12/31/2018 CLINICAL DATA:  Cough EXAM: PORTABLE CHEST 1 VIEW COMPARISON:  10/05/2018 FINDINGS: Stable cardiomegaly. Mild pulmonary vascular congestion. Right middle lobe airspace opacity. There is likely a retrocardiac opacity in the left lung base. No pneumothorax. IMPRESSION: 1. Right middle lobe airspace opacity with additional probable left lung base opacity. Findings could reflect pneumonia or aspiration. 2. Cardiomegaly with mild pulmonary vascular congestion. Electronically Signed   By: Davina Poke M.D.   On: 12/31/2018 20:40   Procedures Procedures (including critical care time)  Medications Ordered in ED Medications - No data to display  ED Course  I have reviewed the triage vital signs and the nursing notes.  Pertinent labs & imaging results that were available during my care of the patient were reviewed by me and considered in my medical decision making (see chart for details).  73 year old dialysis patient, presents for evaluation known Covid status.  She is afebrile, nonseptic, non-ill-appearing.  Tuesday, Thursday, Saturday dialysis.  She is compliant with this.  Has had decreased appetite.  1 episode of nonbloody diarrhea.  Denies chest pain, shortness of breath, increased weight, hemoptysis. Patient states her daughter just wanted her to "be checked out."  Patient presents with note from [facility] positive for Covid and influenza. Chest x-ray does show some mild vascular congestion without overt pulmonary edema.  Patient with pneumonia, likely viral in etiology given Covid positive.  Patient  does not appear grossly fluid overloaded.  She has no tachycardia, tachypnea or hypoxia.  Will check basic labs, EKG and reevaluate.  CBC without leukocytosis, Hgb at baseline, between 8-10 CMP at baseline Ambulatory with oxygen saturation >92 % on RA. No tachypnea however did have some minimal SOB, improved at rest.  Attempted to contact patients daughter however unable to reach contact at [phone]  Patient reassess continued benign abd exam. Lungs clear. No evidence of gross fluid overload. Patient to be dc home with strict return precautions. Patient evaluated by attending Dr. Sherry Ruffing who agrees with above treatment, plan and disposition. Patient states that they already have COVID patients at her dialysis facility and she will be able to dialyze tomorrow at her appointment time, per patient.   The patient has been appropriately medically screened and/or stabilized in the ED. I have low suspicion for any other  emergent medical condition which would require further screening, evaluation or treatment in the ED or require inpatient management.  Patient is hemodynamically stable and in no acute distress.  Patient able to ambulate in department prior to ED.  Evaluation does not show acute pathology that would require ongoing or additional emergent interventions while in the emergency department or further inpatient treatment.  I have discussed the diagnosis with the patient and answered all questions.  Pain is been managed while in the emergency department and patient has no further complaints prior to discharge.  Patient is comfortable with plan discussed in room and is stable for discharge at this time.  I have discussed strict return precautions for returning to the emergency department.  Patient was encouraged to follow-up with PCP/specialist refer to at discharge.   DILCIA CLEVERLY was evaluated in Emergency Department on 12/31/2018 for the symptoms described in the history of present illness.  She was evaluated in the context of the global COVID-19 pandemic, which necessitated consideration that the patient might be at risk for infection with the SARS-CoV-2 virus that causes COVID-19. Institutional protocols and algorithms that pertain to the evaluation of patients at risk for COVID-19 are in a state of rapid change based on information released by regulatory bodies including the CDC and federal and state organizations. These policies and algorithms were followed during the patient's care in the ED.    MDM Rules/Calculators/A&P     CHA2DS2/VAS Stroke Risk Points      N/A >= 2 Points: High Risk  1 - 1.99 Points: Medium Risk  0 Points: Low Risk    A final score could not be computed because of missing components.: Last  Change: N/A     This score determines the patient's risk of having a stroke if the  patient has atrial fibrillation.      This score is not applicable to this patient. Components are not  calculated.                   Final Clinical Impression(s) / ED Diagnoses Final diagnoses:  [id number]    Rx / DC Orders ED Discharge Orders    None       Arcenia Scarbro A, PA-C 12/31/18 2222    Tegeler, Gwenyth Allegra, MD 01/01/19 [phone]

## 2018-12-31 NOTE — Discharge Instructions (Addendum)
Return for new or worsening symptoms

## 2018-12-31 NOTE — ED Triage Notes (Signed)
Pt brought to ED by her daughter ref pt being Covid positive  Pt st's she is a dialysis pt and doesn't really have any complaints.  St's her daughter wanted her to come here

## 2019-03-02 ENCOUNTER — Encounter (HOSPITAL_COMMUNITY): Payer: Self-pay

## 2019-03-02 ENCOUNTER — Emergency Department (HOSPITAL_COMMUNITY)
Admission: EM | Admit: 2019-03-02 | Discharge: 2019-03-03 | Disposition: A | Discharge status: Home / Self Care | Payer: Medicare Other | Attending: Emergency Medicine | Admitting: Emergency Medicine

## 2019-03-02 ENCOUNTER — Other Ambulatory Visit: Payer: Self-pay

## 2019-03-02 ENCOUNTER — Emergency Department (HOSPITAL_COMMUNITY): Payer: Medicare Other

## 2019-03-02 DIAGNOSIS — Z87891 Personal history of nicotine dependence: Secondary | ICD-10-CM | POA: Diagnosis not present

## 2019-03-02 DIAGNOSIS — W010XXA Fall on same level from slipping, tripping and stumbling without subsequent striking against object, initial encounter: Secondary | ICD-10-CM | POA: Diagnosis not present

## 2019-03-02 DIAGNOSIS — N186 End stage renal disease: Secondary | ICD-10-CM | POA: Insufficient documentation

## 2019-03-02 DIAGNOSIS — I2510 Atherosclerotic heart disease of native coronary artery without angina pectoris: Secondary | ICD-10-CM | POA: Insufficient documentation

## 2019-03-02 DIAGNOSIS — Z96652 Presence of left artificial knee joint: Secondary | ICD-10-CM | POA: Insufficient documentation

## 2019-03-02 DIAGNOSIS — M25561 Pain in right knee: Secondary | ICD-10-CM | POA: Insufficient documentation

## 2019-03-02 DIAGNOSIS — Y929 Unspecified place or not applicable: Secondary | ICD-10-CM | POA: Insufficient documentation

## 2019-03-02 DIAGNOSIS — Y999 Unspecified external cause status: Secondary | ICD-10-CM | POA: Insufficient documentation

## 2019-03-02 DIAGNOSIS — W19XXXA Unspecified fall, initial encounter: Secondary | ICD-10-CM

## 2019-03-02 DIAGNOSIS — M25562 Pain in left knee: Secondary | ICD-10-CM | POA: Diagnosis not present

## 2019-03-02 DIAGNOSIS — Z79899 Other long term (current) drug therapy: Secondary | ICD-10-CM | POA: Insufficient documentation

## 2019-03-02 DIAGNOSIS — I132 Hypertensive heart and chronic kidney disease with heart failure and with stage 5 chronic kidney disease, or end stage renal disease: Secondary | ICD-10-CM | POA: Insufficient documentation

## 2019-03-02 DIAGNOSIS — Z7982 Long term (current) use of aspirin: Secondary | ICD-10-CM | POA: Insufficient documentation

## 2019-03-02 DIAGNOSIS — Z992 Dependence on renal dialysis: Secondary | ICD-10-CM | POA: Insufficient documentation

## 2019-03-02 DIAGNOSIS — M545 Low back pain, unspecified: Secondary | ICD-10-CM

## 2019-03-02 DIAGNOSIS — Y9389 Activity, other specified: Secondary | ICD-10-CM | POA: Diagnosis not present

## 2019-03-02 DIAGNOSIS — I5032 Chronic diastolic (congestive) heart failure: Secondary | ICD-10-CM | POA: Diagnosis not present

## 2019-03-02 LAB — CBC WITH DIFFERENTIAL/PLATELET
Abs Immature Granulocytes: 0.01 10*3/uL (ref 0.00–0.07)
Basophils Absolute: 0.1 10*3/uL (ref 0.0–0.1)
Basophils Relative: 1 %
Eosinophils Absolute: 0.3 10*3/uL (ref 0.0–0.5)
Eosinophils Relative: 4 %
HCT: 30 % — ABNORMAL LOW (ref 36.0–46.0)
Hemoglobin: 9.3 g/dL — ABNORMAL LOW (ref 12.0–15.0)
Immature Granulocytes: 0 %
Lymphocytes Relative: 22 %
Lymphs Abs: 1.5 10*3/uL (ref 0.7–4.0)
MCH: 27 pg (ref 26.0–34.0)
MCHC: 31 g/dL (ref 30.0–36.0)
MCV: 87 fL (ref 80.0–100.0)
Monocytes Absolute: 0.8 10*3/uL (ref 0.1–1.0)
Monocytes Relative: 12 %
Neutro Abs: 4 10*3/uL (ref 1.7–7.7)
Neutrophils Relative %: 61 %
Platelets: 179 10*3/uL (ref 150–400)
RBC: 3.45 MIL/uL — ABNORMAL LOW (ref 3.87–5.11)
RDW: 17.6 % — ABNORMAL HIGH (ref 11.5–15.5)
WBC: 6.7 10*3/uL (ref 4.0–10.5)
nRBC: 0 % (ref 0.0–0.2)

## 2019-03-02 LAB — BASIC METABOLIC PANEL
Anion gap: 15 (ref 5–15)
BUN: 47 mg/dL — ABNORMAL HIGH (ref 8–23)
CO2: 31 mmol/L (ref 22–32)
Calcium: 9.1 mg/dL (ref 8.9–10.3)
Chloride: 97 mmol/L — ABNORMAL LOW (ref 98–111)
Creatinine, Ser: 8.67 mg/dL — ABNORMAL HIGH (ref 0.44–1.00)
GFR calc Af Amer: 5 mL/min — ABNORMAL LOW (ref >60)
GFR calc non Af Amer: 4 mL/min — ABNORMAL LOW (ref >60)
Glucose, Bld: 95 mg/dL (ref 70–99)
Potassium: 4.2 mmol/L (ref 3.5–5.1)
Sodium: 143 mmol/L (ref 135–145)

## 2019-03-02 MED ORDER — HYDROCODONE-ACETAMINOPHEN 5-325 MG PO TABS: 1.0000 | ORAL_TABLET | Freq: Three times a day (TID) | ORAL | 0 refills | Status: AC | PRN

## 2019-03-02 MED ORDER — HYDROCODONE-ACETAMINOPHEN 5-325 MG PO TABS
1.0000 | ORAL_TABLET | Freq: Once | ORAL | Status: AC
  Administered 2019-03-02: 20:00:00 1 via ORAL
  Filled 2019-03-02: qty 1

## 2019-03-02 MED ORDER — OXYCODONE-ACETAMINOPHEN 5-325 MG PO TABS
1.0000 | ORAL_TABLET | Freq: Once | ORAL | Status: AC
  Administered 2019-03-02: 17:00:00 1 via ORAL
  Filled 2019-03-02: qty 1

## 2019-03-02 MED ORDER — OXYCODONE-ACETAMINOPHEN 5-325 MG PO TABS
1.0000 | ORAL_TABLET | Freq: Once | ORAL | Status: AC
  Administered 2019-03-02: 16:00:00 1 via ORAL
  Filled 2019-03-02: qty 1

## 2019-03-02 NOTE — ED Triage Notes (Signed)
Pt from home with ems for c.o bilateral knee pain, pt has had 2 falls within the last week due to generalized weakness. Right knee swollen and hot to touch. Hx of gout, has PRN medication at home but has not taken any recently. Pt receives dialysis T/Th/Sat, last treatment yesterday. Pt a.o, nad.

## 2019-03-02 NOTE — ED Provider Notes (Signed)
[facility]PART[facility] Provider Note   CSN: [id number] Arrival date & time: 03/02/19  1406     History Chief Complaint  Patient presents with  . Knee Pain    [name] is a 74 y.o. female.  HPI   74 year old female with a history of abdominal abscess, right eye blindness, CHF, CAD, ESRD, heart murmur, hypertension, MI, diabetes, who presents to the emergency department today for evaluation of bilateral knee pain.  States that she woke up this morning and her bilateral knees hurt.  The right knee is somewhat swollen and a little bit warm to touch.  She has a history of gout and states this feels somewhat similar.  When she tried to get out of bed this morning she states she was wearing some slippery socks and her feet slid down and she landed on her buttocks.  Denies any head trauma or LOC.  She does have some lower back pain following the fall.  She denies any numbness/weakness of the legs.  She has had some difficulty with ambulation due to the pain.  She denies any fevers or other systemic symptoms at home.  She normally ambulates with a walker.  Goes to dialysis on Tuesday, Thursday, Saturday and went yesterday for the whole session.  Past Medical History:  Diagnosis Date  . Abdominal abscess 12-17-10   abdominal abscesses x2 ? one at this time  . Anemia   . Arthritis    "all over" (08/23/2012)  . Blind right eye    fell and crushed socket and eye fell out, was replaced at [facility]  . CHF (congestive heart failure) ([place])   . Chronic lower back pain   . Coronary artery disease    normal coronaries by 08/25/12 cath  . Dyspnea   . Dysrhythmia    irregular, "skips beats"  . ESRD (end stage renal disease) on dialysis ([place])    "started 12/2011; [place]; TTS" (08/23/2012  . Exertional shortness of breath   . Eye drainage    "lots; since fall 07/2011" (08/23/2012)  . Gout   . H/O hiatal hernia   . Headache(784.0)    "weekly sometimes; since I  fell and hit my head in 07/2011 08/23/2012)  . Heart murmur   . History of blood transfusion    "lots before starting dialysis" (08/23/2012)  . Hypertension    sees Dr. Willey Blade  . Inhalation injury    "worked at [employer]; can't inhale polyurethane or paint, etc" (08/23/2012)  . Myocardial infarction ([place]) 1970's or 1980's  . Neuromuscular disorder ([place])    diabetic neuropathy  . OSA on CPAP   . Staph aureus infection November 2012   . Swelling of both ankles 12-17-10  . Type II diabetes mellitus ([place])    "over 10 years now" (08/23/2012)    Patient Active Problem List   Diagnosis Date Noted  . Fluid overload 10/05/2018  . Shortness of breath 06/17/2018  . Acute left systolic heart failure ([place]) 06/17/2018  . Eye pain, left 01/25/2018  . Acute bronchitis due to Rhinovirus 01/18/2018  . Respiratory distress, acute 01/18/2018  . Essential hypertension 01/17/2018  . HLD (hyperlipidemia) 01/17/2018  . Chronic diastolic CHF (congestive heart failure) ([place]) 01/16/2018  . Centrilobular emphysema ([place]) 01/16/2018  . Acute bronchitis 01/16/2018  . Heme + stool 08/24/2017  . Iron deficiency anemia, unspecified 08/24/2017  . Chest pain 04/06/2017  . Unstable angina ([place]) 04/02/2017  . Hypoxemia 10/13/2016  . Acute  on chronic combined systolic and diastolic CHF (congestive heart failure) ([place]) 10/13/2016  . Anemia due to chronic kidney disease 10/13/2016  . Encephalopathy, metabolic   . Tremor   . Weakness   . Acute hyperkalemia 12/04/2014  . Neurological deficit present 12/04/2014  . Hypercalcemia 12/04/2014  . OSA    . Myocardial infarction ([place])   . Blind right eye   . Coronary artery disease   . CHF (congestive heart failure) ([place])   . ESRD needing dialysis ([place])   . Hyperkalemia 08/05/2014  . Type II diabetes mellitus with renal manifestations ([place]) 01/18/2014  . Arm pain 02/27/2012  . Other complications due to renal dialysis device, implant, and graft 02/13/2012  .  Pain in limb 11/21/2011  . Swollen limb 11/21/2011  . Itching 11/21/2011  . End stage renal disease ([place]) 11/07/2011  . Chronic kidney disease, stage IV (severe) ([place]) 10/24/2011  . Chronic kidney disease (CKD), stage IV (severe) ([place]) 08/29/2011    Past Surgical History:  Procedure Laterality Date  . [place] VITRECTOMY WITH 20 GAUGE MVR PORT Left 05/07/2017   Procedure: 25 GAUGE PARS PLANA VITRECTOMY WITH ENDOLASER LEFT EYE;  Surgeon: Bernarda Caffey, MD;  Location: [facility];  Service: Ophthalmology;  Laterality: Left;  . APPENDECTOMY    . AV FISTULA PLACEMENT  09/30/2011   Procedure: ARTERIOVENOUS (AV) FISTULA CREATION;  Surgeon: Conrad Caddo, MD;  Location: [facility];  Service: Vascular;  Laterality: Left;  BRACHIAL-CEPHALIC  . BACK SURGERY    . BASCILIC VEIN TRANSPOSITION Left 11/10/2011   Left arm   . [place] TRANSPOSITION  01/28/2012   Procedure: BASCILIC VEIN TRANSPOSITION;  Surgeon: Conrad Verdi, MD;  Location: [facility];  Service: Vascular;  Laterality: Left;  Second Stage   . BIOPSY  08/24/2017   Procedure: BIOPSY;  Surgeon: Wilford Corner, MD;  Location: [facility];  Service: Endoscopy;;  . CARDIAC CATHETERIZATION  1980's  . CATARACT EXTRACTION W/ INTRAOCULAR LENS IMPLANT Bilateral 1990's  . COLONOSCOPY WITH PROPOFOL N/A 08/24/2017   Procedure: COLONOSCOPY WITH PROPOFOL;  Surgeon: Wilford Corner, MD;  [facility];  Service: Endoscopy;  Laterality: N/A;  . CORONARY ANGIOGRAPHY N/A 04/07/2017   Procedure: CORONARY ANGIOGRAPHY (CATH LAB);  Surgeon: Dixie Dials, MD;  Location: [facility] CV [facility];  Service: Cardiovascular;  Laterality: N/A;  . DILATION AND CURETTAGE OF UTERUS  1960's  . ESOPHAGOGASTRODUODENOSCOPY (EGD) WITH PROPOFOL N/A 08/24/2017   Procedure: ESOPHAGOGASTRODUODENOSCOPY (EGD) WITH PROPOFOL;  Surgeon: Wilford Corner, MD;  [facility];  Service: Endoscopy;  Laterality: N/A;  . EYE SURGERY    . FISTULA SUPERFICIALIZATION Left 02/18/2017    Procedure: SUPERFICIALIZATION LEFT ARM ARTERIOVENOUS FISTULA;  Surgeon: Serafina Mitchell, MD;  Location: [facility];  Service: Vascular;  Laterality: Left;  . HERNIA REPAIR    . INSERTION OF DIALYSIS CATHETER  01/05/2012   Procedure: INSERTION OF DIALYSIS CATHETER;  Surgeon: Conrad [place], MD;  Location: [facility];  Service: Vascular;  Laterality: N/A;  Right Internal Jugular Placement  . INTRAOCULAR PROSTHESES INSERTION Right 2013   "fell; knocked my eye outl" (08/23/2012)  . JOINT REPLACEMENT    . LAPAROTOMY  12/18/2010   Procedure: EXPLORATORY LAPAROTOMY;  Surgeon: Rolm Bookbinder, MD;  [facility];  Service: General;  Laterality: N/A;  Abdominal Seroma Evacuation  . LEFT HEART CATHETERIZATION WITH CORONARY ANGIOGRAM N/A 08/25/2012   Procedure: LEFT HEART CATHETERIZATION WITH CORONARY ANGIOGRAM;  Surgeon: Birdie Riddle, MD;  Location: [facility] CATH LAB;  Service: Cardiovascular;  Laterality: N/A;  . LUMBAR DISC SURGERY  1970'-80's   X 3  . TOTAL KNEE ARTHROPLASTY Left 1980's  . VAGINAL HYSTERECTOMY  1970's  . VENTRAL HERNIA REPAIR  2012-2013   component separation, repair with biologic; "had 3 surgeries to fix it" (08/23/2012)  . WOUND DEBRIDEMENT  03/20/2011   Procedure: DEBRIDEMENT ABDOMINAL WOUND;  Surgeon: Rolm Bookbinder, MD;  Location: [facility];  Service: General;  Laterality: N/A;  debridement abdominal wall, placement of wound vac     OB History   No obstetric history on file.     Family History  Problem Relation Age of Onset  . Hypertension Mother   . Cancer Brother        spine  . Hypertension Father   . Cancer Sister        brain  . Hypertension Maternal Grandmother   . Anesthesia problems Neg Hx     Social History   Tobacco Use  . Smoking status: Former Smoker    Packs/day: 0.50    Years: 20.00    Pack years: 10.00    Types: Cigarettes    Quit date: 12/16/1980    Years since quitting: 38.2  . Smokeless tobacco: Never Used  Substance Use Topics  . Alcohol use: No  .  Drug use: No    Home Medications Prior to Admission medications   Medication Sig Start Date End Date Taking? Authorizing Provider  acetaminophen (TYLENOL) 325 MG tablet Take 2 tablets (650 mg total) by mouth every 6 (six) hours as needed for mild pain (or Fever >/= 101). 10/12/18   Arrien, Jimmy Picket, MD  albuterol (PROVENTIL) (2.5 MG/3ML) 0.083% nebulizer solution Take 3 mLs (2.5 mg total) by nebulization every 4 (four) hours as needed for shortness of breath. 06/22/18   Dixie Dials, MD  allopurinol (ZYLOPRIM) 100 MG tablet Take 0.5 tablets (50 mg total) by mouth daily. Patient taking differently: Take 50 mg by mouth daily as needed (for gout flares).  06/23/18   Dixie Dials, MD  aspirin EC 81 MG tablet Take 81 mg by mouth daily.      [provider]  atorvastatin (LIPITOR) 40 MG tablet Take 40 mg by mouth daily.    [provider]  budesonide-formoterol (SYMBICORT) 160-4.5 MCG/ACT inhaler Inhale 2 puffs into the lungs 2 (two) times daily. Patient not taking: Reported on 10/05/2018 06/07/18   Lauraine Rinne, NP  cinacalcet (SENSIPAR) 60 MG tablet Take 60 mg by mouth See admin instructions. Take 60 mg by mouth AFTER each dialysis treatment on Tues/Thurs/Sat    [provider]  doxercalciferol (HECTOROL) 4 MCG/2ML injection Inject 1.5 mLs (3 mcg total) into the vein Every Tuesday,Thursday,and Saturday with dialysis. 06/22/18   Dixie Dials, MD  HYDROcodone-acetaminophen (NORCO/VICODIN) 5-325 MG tablet Take 1 tablet by mouth every 8 (eight) hours as needed. 03/02/19   Marcayla Budge S, PA-C  midodrine (PROAMATINE) 10 MG tablet Take 10 mg by mouth See admin instructions. Take 10 mg by mouth "just prior to dialysis, then another 10 mg mid-treatment"- Tues/Thurs/Sat 04/26/17   [provider]  Multiple Vitamins-Minerals (ONE-A-DAY WOMENS PO) Take 1 tablet by mouth daily with breakfast.    [provider]  oxyCODONE-acetaminophen (PERCOCET/ROXICET) 5-325 MG  tablet Take 1 tablet by mouth every 6 (six) hours as needed for moderate pain. 10/12/18   Arrien, Jimmy Picket, MD  polyethylene glycol (MIRALAX / GLYCOLAX) 17 g packet Take 17 g by mouth daily. 10/13/18   Arrien, Jimmy Picket, MD  sevelamer carbonate (RENVELA) 800 MG tablet Take 4 tablets (3,200 mg total) by mouth 3 (three) times daily with meals. 06/22/18   Dixie Dials, MD  Tiotropium Bromide Monohydrate (SPIRIVA RESPIMAT) 2.5 MCG/ACT AERS Inhale 2 puffs into the lungs daily. Patient taking differently: Inhale 1 puff into the lungs daily.  06/07/18   Lauraine Rinne, NP    Allergies    Adhesive [tape] and Lyrica [pregabalin]  Review of Systems   Review of Systems  Constitutional: Negative for fever.  HENT: Negative for ear pain and sore throat.   Eyes: Negative for visual disturbance.  Respiratory: Negative for cough and shortness of breath.   Cardiovascular: Negative for chest pain.  Gastrointestinal: Negative for abdominal pain, constipation, diarrhea, nausea and vomiting.  Genitourinary: Negative for dysuria and hematuria.  Musculoskeletal: Positive for back pain.       Bilat knee pain  Skin: Negative for rash.  Neurological: Negative for weakness, numbness and headaches.  All other systems reviewed and are negative.   Physical Exam Updated Vital Signs BP (!) 155/67 (BP Location: Right Arm)   Pulse 81   Temp 97.6 F (36.4 C) (Oral)   Resp 18   Ht 5\' 7"  (1.702 m)   Wt 93.9 kg   SpO2 95%   BMI 32.42 kg/m   Physical Exam Vitals and nursing note reviewed.  Constitutional:      General: She is not in acute distress.    Appearance: She is well-developed.  HENT:     Head: Normocephalic and atraumatic.  Eyes:     Conjunctiva/sclera: Conjunctivae normal.  Cardiovascular:     Rate and Rhythm: Normal rate and regular rhythm.     Heart sounds: No murmur.  Pulmonary:     Effort: Pulmonary effort is normal. No respiratory distress.     Breath sounds: Normal breath sounds.   Abdominal:     Palpations: Abdomen is soft.     Tenderness: There is no abdominal tenderness.  Musculoskeletal:     Cervical back: Neck supple.     Right lower leg: No edema.     Left lower leg: No edema.     Comments: Mild midline lumbar TTP. TTP to the bilat knees diffusely without obvious effusion bilaterally. The right knee is mildly warm to touch. There is no induration.   Skin:    General: Skin is warm and dry.  Neurological:     Mental Status: She is alert.     Comments: 4/5 strength to the BLE. Normal sensation throughout. Normal distal pulses bilaterally.      ED Results / Procedures / Treatments   Labs (all labs ordered are listed, but only abnormal results are displayed) Labs Reviewed  CBC WITH DIFFERENTIAL/PLATELET - Abnormal; Notable for the following components:      Result Value   RBC 3.45 (*)    Hemoglobin 9.3 (*)    HCT 30.0 (*)    RDW 17.6 (*)    All other components within normal limits  BASIC METABOLIC PANEL - Abnormal; Notable for the following components:   Chloride 97 (*)    BUN 47 (*)    Creatinine, Ser 8.67 (*)    GFR calc non Af Amer 4 (*)    GFR calc Af Amer 5 (*)    All other components within normal limits    EKG None  Radiology DG Lumbar Spine Complete  Result Date: 03/02/2019 CLINICAL DATA:  Post fall. Patient reports low back pain after multiple falls in  the past 2 weeks in her home. EXAM: LUMBAR SPINE - COMPLETE 4+ VIEW COMPARISON:  Radiograph and MRI 10/05/2018 FINDINGS: Stable aligned with mild straightening of normal lordosis. No evidence of acute fracture. Vertebral body heights are preserved. Degenerative disc disease at L3-L4 and L4-L5, similar in appearance to prior exam. Facet hypertrophy most prominent at L3-L4 and L4-L5. Advanced vascular calcifications. IMPRESSION: 1. No evidence of acute fracture or subluxation of the lumbar spine. 2. Degenerative disc disease and facet hypertrophy, similar in appearance to prior exam.  Electronically Signed   By: Keith Rake M.D.   On: 03/02/2019 16:24   DG Knee Complete 4 Views Left  Result Date: 03/02/2019 CLINICAL DATA:  Recent fall with knee pain, initial encounter EXAM: LEFT KNEE - COMPLETE 4+ VIEW COMPARISON:  None. FINDINGS: Left knee prosthesis is noted. No acute fracture or dislocation is noted. Diffuse vascular calcifications are seen. No definitive loosening is seen. IMPRESSION: Status post left knee replacement without acute abnormality. Electronically Signed   By: Inez Catalina M.D.   On: 03/02/2019 16:24   DG Knee Complete 4 Views Right  Result Date: 03/02/2019 CLINICAL DATA:  Bilateral knee and lower back pain, multiple falls EXAM: RIGHT KNEE - COMPLETE 4+ VIEW COMPARISON:  12/07/2003 FINDINGS: Frontal, bilateral oblique, lateral views of the right knee are obtained. There is severe 3 compartment osteoarthritis greatest in the medial and patellofemoral compartments. There are no acute displaced fractures. No joint effusion. Diffuse vascular calcifications. IMPRESSION: 1. Severe 3 compartment osteoarthritis.  No displaced fracture. Electronically Signed   By: Randa Ngo M.D.   On: 03/02/2019 16:28    Procedures Procedures (including critical care time)  Medications Ordered in ED Medications  oxyCODONE-acetaminophen (PERCOCET/ROXICET) 5-325 MG per tablet 1 tablet (1 tablet Oral Given 03/02/19 1546)  oxyCODONE-acetaminophen (PERCOCET/ROXICET) 5-325 MG per tablet 1 tablet (1 tablet Oral Given 03/02/19 1656)  HYDROcodone-acetaminophen (NORCO/VICODIN) 5-325 MG per tablet 1 tablet (1 tablet Oral Given 03/02/19 2009)    ED Course  I have reviewed the triage vital signs and the nursing notes.  Pertinent labs & imaging results that were available during my care of the patient were reviewed by me and considered in my medical decision making (see chart for details).    MDM Rules/Calculators/A&P                      74 year old female presenting for evaluation  of bilateral knee pain.  Also had mechanical fall earlier today without head trauma or LOC.  She somewhat hypertensive but her vital signs are otherwise reassuring.  She does have some tenderness to the bilateral knees.  She does not have any evidence of a septic arthritis.    Bilateral knee x-rays were obtained X-ray right knee showed severe try car parchment arthritis without any obvious fracture or effusion X-ray left knee shows evidence of knee replacement without acute abnormality X-ray of the lumbar spine without acute fracture or subluxation.  Degenerative disc disease and facet hypertrophy noted which is similar on prior exam.  Patient was given pain medicines and on reassessment she was able to ambulate with nursing staff with a walker.  I think that her symptoms are likely related to a gout flare.  She does not have evidence of septic arthritis on exam.  I think that her underlying arthritis is likely exacerbating her symptoms as well.  I discussed the findings and plan for discharge and we have placed face-to-face evaluation for home health.  We discussed  plan for discharge home with pain medications and PCP follow-up.  Advised on specific return precautions.  She voiced understanding the plan and reasons to return.  All Questions answered.  Patient stable for discharge.  Discussed case with Dr. Johnney Killian who personally evaluated the pt and is in agreement with plan.   Final Clinical Impression(s) / ED Diagnoses Final diagnoses:  Pain in both knees, unspecified chronicity  Low back pain, unspecified back pain laterality, unspecified chronicity, unspecified whether sciatica present  Fall, initial encounter    Rx / DC Orders ED Discharge Orders         [place]     03/02/19 1914    Face-to-face encounter (required for Medicare/Medicaid patients)    Comments: I Hundred certify that this patient is under my care and that I, or a nurse practitioner or physician's  assistant working with me, had a face-to-face encounter that meets the physician face-to-face encounter requirements with this patient on 03/02/2019. The encounter with the patient was in whole, or in part for the following medical condition(s) which is the primary reason for home health care (List medical condition): bilateral knee pain   03/02/19 1914    HYDROcodone-acetaminophen (NORCO/VICODIN) 5-325 MG tablet  Every 8 hours PRN     03/02/19 1926           Rodney Booze, PA-C 03/02/19 2155    Charlesetta Shanks, MD 03/04/19 1516

## 2019-03-02 NOTE — ED Notes (Signed)
Pt ambulated with walker, gait is more steady than when she first came in, pt able to take a few steps around the hall but pt unsure about going home and staying by herself. EDP made aware

## 2019-03-02 NOTE — ED Provider Notes (Signed)
Medical screening examination/treatment/procedure(s) were conducted as a shared visit with non-physician practitioner(s) and myself.  I personally evaluated the patient during the encounter.    Patient reports both knees have been painful.  She has noted some swelling.  She reports predominantly she is having trouble because it is making it more difficult for her to walk and get in and out of her home.  Both knees are symmetric.  She does have areas of significant arthritic changes without evident effusion or warmth to touch.  Lower legs are pliable and calves are nontender.  At this time I most suspect chronic arthritic type pain.  Patient does have history of gout but knees are not inflamed at this time.  Will opt to treat with PRN hydrocodone, especially with patient's history of chronic renal failure.  I agree with plan of management.   Charlesetta Shanks, MD 03/04/19 [id number]

## 2019-03-02 NOTE — ED Notes (Signed)
Pt transported to xray 

## 2019-03-02 NOTE — ED Notes (Signed)
[facility] called to transport pt home 

## 2019-03-02 NOTE — Discharge Instructions (Signed)
Prescription given for Norco. Take medication as directed and do not operate machinery, drive a car, or work while taking this medication as it can make you drowsy.   Please follow up with your primary care provider within 5-7 days for re-evaluation of your symptoms. If you do not have a primary care provider, information for a healthcare clinic has been provided for you to make arrangements for follow up care. Please return to the emergency department for any new or worsening symptoms.  

## 2019-03-02 NOTE — Care Management (Signed)
CM met with patient to discuss recommendations for [facility] services, patient reports HD T/TH/Sat but is agreeable to receive [facility] services. Discussed choice and preferred [facility] agencies patient states she does not have a preference. Patient has [place] will fax to [facility] agency patient is agreeable. Patient is unable to weight bear c/o complains of right knee pain.   Patient will need  transport home by Vinson Moselle RN.  CM will follow up tomorrow evening with patient.

## 2019-03-30 ENCOUNTER — Encounter (HOSPITAL_COMMUNITY): Payer: Self-pay | Admitting: Emergency Medicine

## 2019-03-30 ENCOUNTER — Emergency Department (HOSPITAL_COMMUNITY): Payer: Medicare Other

## 2019-03-30 ENCOUNTER — Other Ambulatory Visit: Payer: Self-pay

## 2019-03-30 ENCOUNTER — Emergency Department (HOSPITAL_COMMUNITY)
Admission: EM | Admit: 2019-03-30 | Discharge: 2019-03-30 | Disposition: A | Discharge status: Home / Self Care | Payer: Medicare Other | Attending: Emergency Medicine | Admitting: Emergency Medicine

## 2019-03-30 DIAGNOSIS — I132 Hypertensive heart and chronic kidney disease with heart failure and with stage 5 chronic kidney disease, or end stage renal disease: Secondary | ICD-10-CM | POA: Diagnosis not present

## 2019-03-30 DIAGNOSIS — R531 Weakness: Secondary | ICD-10-CM | POA: Diagnosis not present

## 2019-03-30 DIAGNOSIS — E1122 Type 2 diabetes mellitus with diabetic chronic kidney disease: Secondary | ICD-10-CM | POA: Diagnosis not present

## 2019-03-30 DIAGNOSIS — Z992 Dependence on renal dialysis: Secondary | ICD-10-CM | POA: Diagnosis not present

## 2019-03-30 DIAGNOSIS — Z87891 Personal history of nicotine dependence: Secondary | ICD-10-CM | POA: Insufficient documentation

## 2019-03-30 DIAGNOSIS — N186 End stage renal disease: Secondary | ICD-10-CM | POA: Diagnosis not present

## 2019-03-30 DIAGNOSIS — Z7982 Long term (current) use of aspirin: Secondary | ICD-10-CM | POA: Diagnosis not present

## 2019-03-30 DIAGNOSIS — I5042 Chronic combined systolic (congestive) and diastolic (congestive) heart failure: Secondary | ICD-10-CM | POA: Diagnosis not present

## 2019-03-30 DIAGNOSIS — Z79899 Other long term (current) drug therapy: Secondary | ICD-10-CM | POA: Diagnosis not present

## 2019-03-30 DIAGNOSIS — I2510 Atherosclerotic heart disease of native coronary artery without angina pectoris: Secondary | ICD-10-CM | POA: Diagnosis not present

## 2019-03-30 DIAGNOSIS — R42 Dizziness and giddiness: Secondary | ICD-10-CM | POA: Diagnosis present

## 2019-03-30 LAB — BASIC METABOLIC PANEL
Anion gap: 13 (ref 5–15)
BUN: 25 mg/dL — ABNORMAL HIGH (ref 8–23)
CO2: 31 mmol/L (ref 22–32)
Calcium: 9.5 mg/dL (ref 8.9–10.3)
Chloride: 95 mmol/L — ABNORMAL LOW (ref 98–111)
Creatinine, Ser: 6.98 mg/dL — ABNORMAL HIGH (ref 0.44–1.00)
GFR calc Af Amer: 6 mL/min — ABNORMAL LOW (ref >60)
GFR calc non Af Amer: 5 mL/min — ABNORMAL LOW (ref >60)
Glucose, Bld: 98 mg/dL (ref 70–99)
Potassium: 4.8 mmol/L (ref 3.5–5.1)
Sodium: 139 mmol/L (ref 135–145)

## 2019-03-30 LAB — CBC
HCT: 32.9 % — ABNORMAL LOW (ref 36.0–46.0)
Hemoglobin: 10.1 g/dL — ABNORMAL LOW (ref 12.0–15.0)
MCH: 27.2 pg (ref 26.0–34.0)
MCHC: 30.7 g/dL (ref 30.0–36.0)
MCV: 88.7 fL (ref 80.0–100.0)
Platelets: 206 10*3/uL (ref 150–400)
RBC: 3.71 MIL/uL — ABNORMAL LOW (ref 3.87–5.11)
RDW: 18.6 % — ABNORMAL HIGH (ref 11.5–15.5)
WBC: 5.1 10*3/uL (ref 4.0–10.5)
nRBC: 0 % (ref 0.0–0.2)

## 2019-03-30 LAB — CBG MONITORING, ED
Glucose-Capillary: 99 mg/dL (ref 70–99)
Glucose-Capillary: 87 mg/dL (ref 70–99)

## 2019-03-30 MED ORDER — ACETAMINOPHEN 325 MG PO TABS
650.0000 mg | ORAL_TABLET | Freq: Once | ORAL | Status: AC
  Administered 2019-03-30: 650 mg via ORAL
  Filled 2019-03-30: qty 2

## 2019-03-30 MED ORDER — MECLIZINE HCL 50 MG PO TABS: 50.0000 mg | ORAL_TABLET | Freq: Three times a day (TID) | ORAL | 0 refills | Status: AC | PRN

## 2019-03-30 MED ORDER — SODIUM CHLORIDE 0.9% FLUSH: 3.0000 mL | Freq: Once | INTRAVENOUS | Status: DC

## 2019-03-30 MED ORDER — MECLIZINE HCL 25 MG PO TABS
12.5000 mg | ORAL_TABLET | Freq: Once | ORAL | Status: AC
  Administered 2019-03-30: 12.5 mg via ORAL
  Filled 2019-03-30: qty 1

## 2019-03-30 NOTE — ED Notes (Signed)
Pt transported to CT ?

## 2019-03-30 NOTE — Discharge Instructions (Signed)
Please return if you feel unsafe at home.  Follow up with your doctor to discuss your worsening difficulty walking.

## 2019-03-30 NOTE — ED Provider Notes (Signed)
I received the patient in signout from Dr. Perlie Mayo.  Patient is a 74 year old female with a chief complaint of generalized weakness.  Thought to be more may be more ataxic on exam.  Plan for MRI if this is negative then the plan will be to discharge patient home.  MRI was negative.  I reassessed the patient.  She feels comfortable going home and thinks that she has enough support that she should be able to get around as needed.  Discharge home.   Deno Etienne, DO 03/30/19 1912

## 2019-03-30 NOTE — ED Triage Notes (Signed)
Reports generalized weakness since having dialysis yesterday.  No arm drift.

## 2019-03-30 NOTE — ED Notes (Signed)
This RN spoke with patient's goddaughter Abundio Miu. She will be here in about an hour to pick up the patient.

## 2019-03-30 NOTE — ED Notes (Signed)
Patient verbalizes understanding of discharge instructions. Opportunity for questioning and answers were provided. Armband removed by staff, pt discharged from ED.  

## 2019-03-30 NOTE — ED Provider Notes (Signed)
[place] EMERGENCY DEPARTMENT Provider Note   CSN: [id number] Arrival date & time: 03/30/19  [id number]     History Chief Complaint  Patient presents with  . generalized weakness    [name] is a 74 y.o. female.  Patient with hx ESRD/HD presents indicates since after dialysis yesterday has felt generally weak, and lightheaded when stands, also w sense of dizziness, mild room spinning, and feels unsteady on feet as if falling over. Symptoms acute onset post dialysis yesterday, moderate, persistent, constant since, perhaps slightly improved now. Denies syncope, trauma or fall. No severe headaches. No neck pain. No chest pain or discomfort. No sob. No abd pain or vomiting or diarrhea. No dysuria. No fever or chills. Denies cough or uri symptoms. No change in meds or new meds. No recent blood loss, rectal bleeding or melena. No change in vision or speech. No focal or unilateral numbness or weakness. At baseline, walks w/ walker, but does not feel as if will fall over. No ear pain, tinnitus, or hearing loss.   The history is provided by the patient.       Past Medical History:  Diagnosis Date  . Abdominal abscess 12-17-10   abdominal abscesses x2 ? one at this time  . Anemia   . Arthritis    "all over" (08/23/2012)  . Blind right eye    fell and crushed socket and eye fell out, was replaced at [facility]  . CHF (congestive heart failure) ([place])   . Chronic lower back pain   . Coronary artery disease    normal coronaries by 08/25/12 cath  . Dyspnea   . Dysrhythmia    irregular, "skips beats"  . ESRD (end stage renal disease) on dialysis ([place])    "started 12/2011; [place]; TTS" (08/23/2012  . Exertional shortness of breath   . Eye drainage    "lots; since fall 07/2011" (08/23/2012)  . Gout   . H/O hiatal hernia   . Headache(784.0)    "weekly sometimes; since I fell and hit my head in 07/2011 08/23/2012)  . Heart murmur   . History of blood transfusion    "lots  before starting dialysis" (08/23/2012)  . Hypertension    sees Dr. Willey Blade  . Inhalation injury    "worked at [employer]; can't inhale polyurethane or paint, etc" (08/23/2012)  . Myocardial infarction ([place]) 1970's or 1980's  . Neuromuscular disorder ([place])    diabetic neuropathy  . OSA on CPAP   . Staph aureus infection November 2012   . Swelling of both ankles 12-17-10  . Type II diabetes mellitus ([place])    "over 10 years now" (08/23/2012)    Patient Active Problem List   Diagnosis Date Noted  . Fluid overload 10/05/2018  . Shortness of breath 06/17/2018  . Acute left systolic heart failure ([place]) 06/17/2018  . Eye pain, left 01/25/2018  . Acute bronchitis due to Rhinovirus 01/18/2018  . Respiratory distress, acute 01/18/2018  . Essential hypertension 01/17/2018  . HLD (hyperlipidemia) 01/17/2018  . Chronic diastolic CHF (congestive heart failure) ([place]) 01/16/2018  . Centrilobular emphysema ([place]) 01/16/2018  . Acute bronchitis 01/16/2018  . Heme + stool 08/24/2017  . Iron deficiency anemia, unspecified 08/24/2017  . Chest pain 04/06/2017  . Unstable angina ([place]) 04/02/2017  . Hypoxemia 10/13/2016  . Acute on chronic combined systolic and diastolic CHF (congestive heart failure) ([place]) 10/13/2016  . Anemia due to chronic kidney disease 10/13/2016  . Encephalopathy, metabolic   .  Tremor   . Weakness   . Acute hyperkalemia 12/04/2014  . Neurological deficit present 12/04/2014  . Hypercalcemia 12/04/2014  . OSA    . Myocardial infarction ([place])   . Blind right eye   . Coronary artery disease   . CHF (congestive heart failure) ([place])   . ESRD needing dialysis ([place])   . Hyperkalemia 08/05/2014  . Type II diabetes mellitus with renal manifestations ([place]) 01/18/2014  . Arm pain 02/27/2012  . Other complications due to renal dialysis device, implant, and graft 02/13/2012  . Pain in limb 11/21/2011  . Swollen limb 11/21/2011  . Itching 11/21/2011  . End stage renal disease  ([place]) 11/07/2011  . Chronic kidney disease, stage IV (severe) ([place]) 10/24/2011  . Chronic kidney disease (CKD), stage IV (severe) ([place]) 08/29/2011    Past Surgical History:  Procedure Laterality Date  . [place] VITRECTOMY WITH 20 GAUGE MVR PORT Left 05/07/2017   Procedure: 25 GAUGE PARS PLANA VITRECTOMY WITH ENDOLASER LEFT EYE;  Surgeon: Bernarda Caffey, MD;  Location: [place];  Service: Ophthalmology;  Laterality: Left;  . APPENDECTOMY    . AV FISTULA PLACEMENT  09/30/2011   Procedure: ARTERIOVENOUS (AV) FISTULA CREATION;  Surgeon: Conrad Williamson, MD;  Location: [facility];  Service: Vascular;  Laterality: Left;  BRACHIAL-CEPHALIC  . BACK SURGERY    . BASCILIC VEIN TRANSPOSITION Left 11/10/2011   Left arm   . [place] TRANSPOSITION  01/28/2012   Procedure: BASCILIC VEIN TRANSPOSITION;  Surgeon: Conrad Winona, MD;  Location: [place];  Service: Vascular;  Laterality: Left;  Second Stage   . BIOPSY  08/24/2017   Procedure: BIOPSY;  Surgeon: Wilford Corner, MD;  Location: [facility];  Service: Endoscopy;;  . CARDIAC CATHETERIZATION  1980's  . CATARACT EXTRACTION W/ INTRAOCULAR LENS IMPLANT Bilateral 1990's  . COLONOSCOPY WITH PROPOFOL N/A 08/24/2017   Procedure: COLONOSCOPY WITH PROPOFOL;  Surgeon: Wilford Corner, MD;  [facility];  Service: Endoscopy;  Laterality: N/A;  . CORONARY ANGIOGRAPHY N/A 04/07/2017   Procedure: CORONARY ANGIOGRAPHY (CATH LAB);  Surgeon: Dixie Dials, MD;  Location: [place] CV LAB;  Service: Cardiovascular;  Laterality: N/A;  . DILATION AND CURETTAGE OF UTERUS  1960's  . ESOPHAGOGASTRODUODENOSCOPY (EGD) WITH PROPOFOL N/A 08/24/2017   Procedure: ESOPHAGOGASTRODUODENOSCOPY (EGD) WITH PROPOFOL;  Surgeon: Wilford Corner, MD;  [facility];  Service: Endoscopy;  Laterality: N/A;  . EYE SURGERY    . FISTULA SUPERFICIALIZATION Left 02/18/2017   Procedure: SUPERFICIALIZATION LEFT ARM ARTERIOVENOUS FISTULA;  Surgeon: Serafina Mitchell, MD;   Location: [place];  Service: Vascular;  Laterality: Left;  . HERNIA REPAIR    . INSERTION OF DIALYSIS CATHETER  01/05/2012   Procedure: INSERTION OF DIALYSIS CATHETER;  Surgeon: Conrad Carlisle, MD;  Location: [facility];  Service: Vascular;  Laterality: N/A;  Right Internal Jugular Placement  . INTRAOCULAR PROSTHESES INSERTION Right 2013   "fell; knocked my eye outl" (08/23/2012)  . JOINT REPLACEMENT    . LAPAROTOMY  12/18/2010   Procedure: EXPLORATORY LAPAROTOMY;  Surgeon: Rolm Bookbinder, MD;  [facility]: [facility];  Service: General;  Laterality: N/A;  Abdominal Seroma Evacuation  . LEFT HEART CATHETERIZATION WITH CORONARY ANGIOGRAM N/A 08/25/2012   Procedure: LEFT HEART CATHETERIZATION WITH CORONARY ANGIOGRAM;  Surgeon: Birdie Riddle, MD;  Location: [facility] CATH LAB;  Service: Cardiovascular;  Laterality: N/A;  . LUMBAR DISC SURGERY  1970'-80's   X 3  . TOTAL KNEE ARTHROPLASTY Left 1980's  . VAGINAL HYSTERECTOMY  1970's  .  VENTRAL HERNIA REPAIR  2012-2013   component separation, repair with biologic; "had 3 surgeries to fix it" (08/23/2012)  . WOUND DEBRIDEMENT  03/20/2011   Procedure: DEBRIDEMENT ABDOMINAL WOUND;  Surgeon: Rolm Bookbinder, MD;  Location: [facility];  Service: General;  Laterality: N/A;  debridement abdominal wall, placement of wound vac     OB History   No obstetric history on file.     Family History  Problem Relation Age of Onset  . Hypertension Mother   . Cancer Brother        spine  . Hypertension Father   . Cancer Sister        brain  . Hypertension Maternal Grandmother   . Anesthesia problems Neg Hx     Social History   Tobacco Use  . Smoking status: Former Smoker    Packs/day: 0.50    Years: 20.00    Pack years: 10.00    Types: Cigarettes    Quit date: 12/16/1980    Years since quitting: 38.3  . Smokeless tobacco: Never Used  Substance Use Topics  . Alcohol use: No  . Drug use: No    Home Medications Prior to Admission medications   Medication Sig Start Date  End Date Taking? Authorizing Provider  acetaminophen (TYLENOL) 325 MG tablet Take 2 tablets (650 mg total) by mouth every 6 (six) hours as needed for mild pain (or Fever >/= 101). 10/12/18  Yes Arrien, Jimmy Picket, MD  albuterol (PROVENTIL) (2.5 MG/3ML) 0.083% nebulizer solution Take 3 mLs (2.5 mg total) by nebulization every 4 (four) hours as needed for shortness of breath. 06/22/18  Yes Dixie Dials, MD  allopurinol (ZYLOPRIM) 100 MG tablet Take 0.5 tablets (50 mg total) by mouth daily. Patient taking differently: Take 50 mg by mouth daily as needed (for gout flares).  06/23/18  Yes Dixie Dials, MD  ALPRAZolam Duanne Moron) 0.25 MG tablet Take 0.25 mg by mouth as needed for anxiety. 10/12/18  Yes [provider]  aspirin EC 81 MG tablet Take 81 mg by mouth daily.     Yes [provider]  atorvastatin (LIPITOR) 40 MG tablet Take 40 mg by mouth daily.   Yes [provider]  budesonide-formoterol (SYMBICORT) 160-4.5 MCG/ACT inhaler Inhale 2 puffs into the lungs 2 (two) times daily. 06/07/18  Yes Lauraine Rinne, NP  cinacalcet (SENSIPAR) 60 MG tablet Take 60 mg by mouth See admin instructions. Take 60 mg by mouth AFTER each dialysis treatment on Tues/Thurs/Sat   Yes [provider]  cloNIDine (CATAPRES) 0.1 MG tablet Take 0.1 mg by mouth in the morning, at noon, in the evening, and at bedtime.  03/26/19  Yes [provider]  methocarbamol (ROBAXIN) 500 MG tablet Take 500 mg by mouth 3 (three) times daily as needed for muscle spasms. 03/21/19  Yes [provider]  midodrine (PROAMATINE) 10 MG tablet Take 10 mg by mouth See admin instructions. Take 10 mg by mouth "just prior to dialysis, then another 10 mg mid-treatment"- Tues/Thurs/Sat 04/26/17  Yes [provider]  Multiple Vitamins-Minerals (ONE-A-DAY WOMENS PO) Take 1 tablet by mouth daily with breakfast.   Yes [provider]  oxyCODONE-acetaminophen (PERCOCET/ROXICET) 5-325 MG tablet Take 1  tablet by mouth every 6 (six) hours as needed for moderate pain. 10/12/18  Yes Arrien, Jimmy Picket, MD  polyethylene glycol (MIRALAX / GLYCOLAX) 17 g packet Take 17 g by mouth daily. Patient taking differently: Take 17 g by mouth daily as needed for mild constipation.  10/13/18  Yes Arrien, Jimmy Picket, MD  sevelamer carbonate (RENVELA) 800 MG tablet Take 4 tablets (3,200 mg total) by mouth 3 (three) times daily with meals. 06/22/18  Yes Dixie Dials, MD  Tiotropium Bromide Monohydrate (SPIRIVA RESPIMAT) 2.5 MCG/ACT AERS Inhale 2 puffs into the lungs daily. Patient taking differently: Inhale 1 puff into the lungs daily.  06/07/18  Yes Lauraine Rinne, NP  doxercalciferol (HECTOROL) 4 MCG/2ML injection Inject 1.5 mLs (3 mcg total) into the vein Every Tuesday,Thursday,and Saturday with dialysis. 06/22/18   Dixie Dials, MD  HYDROcodone-acetaminophen (NORCO/VICODIN) 5-325 MG tablet Take 1 tablet by mouth every 8 (eight) hours as needed. Patient not taking: Reported on 03/30/2019 03/02/19   Couture, Cortni S, PA-C    Allergies    Adhesive [tape] and Lyrica [pregabalin]  Review of Systems   Review of Systems  Constitutional: Negative for chills and fever.  HENT: Negative for sore throat.   Eyes: Negative for redness and visual disturbance.  Respiratory: Negative for cough and shortness of breath.   Cardiovascular: Negative for chest pain and palpitations.  Gastrointestinal: Negative for abdominal pain, blood in stool, diarrhea and vomiting.  Genitourinary: Negative for dysuria and flank pain.  Musculoskeletal: Negative for back pain and neck pain.  Skin: Negative for rash.  Neurological: Positive for dizziness and light-headedness. Negative for speech difficulty, numbness and headaches.  Hematological: Does not bruise/bleed easily.  Psychiatric/Behavioral: Negative for confusion.    Physical Exam Updated Vital Signs BP (!) 165/73 (BP Location: Left Arm)   Pulse 65   Temp 98.6 F (37 C)  (Oral)   Resp 18   SpO2 97%   Physical Exam Vitals and nursing note reviewed.  Constitutional:      Appearance: Normal appearance. She is well-developed.  HENT:     Head: Atraumatic.     Right Ear: Tympanic membrane normal.     Left Ear: Tympanic membrane normal.     Nose: Nose normal.     Mouth/Throat:     Mouth: Mucous membranes are moist.  Eyes:     General: No scleral icterus.    Conjunctiva/sclera: Conjunctivae normal.     Pupils: Pupils are equal, round, and reactive to light.     Comments: Chronic changes/prior injury/chr scarring and blindness right eye. Left pupils round, reactive, with vision grossly intact.   Neck:     Vascular: No carotid bruit.     Trachea: No tracheal deviation.  Cardiovascular:     Rate and Rhythm: Normal rate and regular rhythm.     Pulses: Normal pulses.     Heart sounds: Normal heart sounds. No murmur. No friction rub. No gallop.   Pulmonary:     Effort: Pulmonary effort is normal. No respiratory distress.     Breath sounds: Normal breath sounds.  Abdominal:     General: Bowel sounds are normal. There is no distension.     Palpations: Abdomen is soft. There is no mass.     Tenderness: There is no abdominal tenderness. There is no guarding.  Genitourinary:    Comments: No cva tenderness.  Musculoskeletal:        General: No swelling or tenderness.     Cervical back: Normal range of motion and neck supple. No rigidity. No muscular tenderness.     Comments: LUE fistula w palp thrill. CTLS spine, non tender, aligned, no step off.   Skin:    General: Skin is warm and dry.     Findings: No rash.  Neurological:  Mental Status: She is alert.     Comments: Alert, speech normal. Motor/sens grossly intact bil. Patient unable to stand or walk, feels as if will fall over, ataxic.   Psychiatric:        Mood and Affect: Mood normal.     ED Results / Procedures / Treatments   Labs (all labs ordered are listed, but only abnormal results are  displayed) Results for orders placed or performed during the hospital encounter of 22/02/54  Basic metabolic panel  Result Value Ref Range   Sodium 139 135 - 145 mmol/L   Potassium 4.8 3.5 - 5.1 mmol/L   Chloride 95 (L) 98 - 111 mmol/L   CO2 31 22 - 32 mmol/L   Glucose, Bld 98 70 - 99 mg/dL   BUN 25 (H) 8 - 23 mg/dL   Creatinine, Ser 6.98 (H) 0.44 - 1.00 mg/dL   Calcium 9.5 8.9 - 10.3 mg/dL   GFR calc non Af Amer 5 (L) >60 mL/min   GFR calc Af Amer 6 (L) >60 mL/min   Anion gap 13 5 - 15  CBC  Result Value Ref Range   WBC 5.1 4.0 - 10.5 K/uL   RBC 3.71 (L) 3.87 - 5.11 MIL/uL   Hemoglobin 10.1 (L) 12.0 - 15.0 g/dL   HCT 32.9 (L) 36.0 - 46.0 %   MCV 88.7 80.0 - 100.0 fL   MCH 27.2 26.0 - 34.0 pg   MCHC 30.7 30.0 - 36.0 g/dL   RDW 18.6 (H) 11.5 - 15.5 %   Platelets 206 150 - 400 K/uL   nRBC 0.0 0.0 - 0.2 %  CBG monitoring, ED  Result Value Ref Range   Glucose-Capillary 99 70 - 99 mg/dL  CBG monitoring, ED  Result Value Ref Range   Glucose-Capillary 87 70 - 99 mg/dL   DG Lumbar Spine Complete  Result Date: 03/02/2019 CLINICAL DATA:  Post fall. Patient reports low back pain after multiple falls in the past 2 weeks in her home. EXAM: LUMBAR SPINE - COMPLETE 4+ VIEW COMPARISON:  Radiograph and MRI 10/05/2018 FINDINGS: Stable aligned with mild straightening of normal lordosis. No evidence of acute fracture. Vertebral body heights are preserved. Degenerative disc disease at L3-L4 and L4-L5, similar in appearance to prior exam. Facet hypertrophy most prominent at L3-L4 and L4-L5. Advanced vascular calcifications. IMPRESSION: 1. No evidence of acute fracture or subluxation of the lumbar spine. 2. Degenerative disc disease and facet hypertrophy, similar in appearance to prior exam. Electronically Signed   By: Keith Rake M.D.   On: 03/02/2019 16:24   DG Knee Complete 4 Views Left  Result Date: 03/02/2019 CLINICAL DATA:  Recent fall with knee pain, initial encounter EXAM: LEFT KNEE -  COMPLETE 4+ VIEW COMPARISON:  None. FINDINGS: Left knee prosthesis is noted. No acute fracture or dislocation is noted. Diffuse vascular calcifications are seen. No definitive loosening is seen. IMPRESSION: Status post left knee replacement without acute abnormality. Electronically Signed   By: Inez Catalina M.D.   On: 03/02/2019 16:24   DG Knee Complete 4 Views Right  Result Date: 03/02/2019 CLINICAL DATA:  Bilateral knee and lower back pain, multiple falls EXAM: RIGHT KNEE - COMPLETE 4+ VIEW COMPARISON:  12/07/2003 FINDINGS: Frontal, bilateral oblique, lateral views of the right knee are obtained. There is severe 3 compartment osteoarthritis greatest in the medial and patellofemoral compartments. There are no acute displaced fractures. No joint effusion. Diffuse vascular calcifications. IMPRESSION: 1. Severe 3 compartment osteoarthritis.  No displaced fracture.  Electronically Signed   By: Randa Ngo M.D.   On: 03/02/2019 16:28    EKG EKG Interpretation  Date/Time:  Wednesday March 30 2019 09:48:12 EST Ventricular Rate:  71 PR Interval:  196 QRS Duration: 86 QT Interval:  474 QTC Calculation: 515 R Axis:   -74 Text Interpretation: Sinus rhythm with marked sinus arrhythmia Left anterior fascicular block Nonspecific T wave abnormality Prolonged QT Confirmed by Lajean Saver [phone]) on 03/30/2019 10:46:17 AM   Radiology CT HEAD WO CONTRAST  Result Date: 03/30/2019 CLINICAL DATA:  Headache, acute. EXAM: CT HEAD WITHOUT CONTRAST TECHNIQUE: Contiguous axial images were obtained from the base of the skull through the vertex without intravenous contrast. COMPARISON:  12/04/2014 FINDINGS: Brain: No evidence of acute infarction, hemorrhage, hydrocephalus, extra-axial collection or mass lesion/mass effect. Signs of chronic microvascular ischemic change and atrophy similar to the prior study. Vascular: No hyperdense vessel or unexpected calcification. Skull: Coarsened trabeculation compatible with  renal osteodystrophy, no acute findings. Sinuses/Orbits: Visualized paranasal sinuses are unremarkable. Signs of right globe resection with presumed prosthesis is un changed. Other: None. IMPRESSION: 1. No acute intracranial pathology. 2. Signs of chronic microvascular ischemic change and atrophy similar to the prior study. Electronically Signed   By: Zetta Bills M.D.   On: 03/30/2019 14:47    Procedures Procedures (including critical care time)  Medications Ordered in ED Medications  sodium chloride flush (NS) 0.9 % injection 3 mL (has no administration in time range)  acetaminophen (TYLENOL) tablet 650 mg (has no administration in time range)    ED Course  I have reviewed the triage vital signs and the nursing notes.  Pertinent labs & imaging results that were available during my care of the patient were reviewed by me and considered in my medical decision making (see chart for details).    MDM Rules/Calculators/A&P                      ECG. Labs sent.   Reviewed nursing notes and prior charts for additional history.   Patient indicates has not yet eaten/drank today. Po fluids/food provided, encouraged to drink fluids.   Labs reviewed/interpreted by me - wbc normal, hct 33 c/w baseline. k normal.   CT reviewed/interpreted by me - no acute hem.   Recheck, dizziness persists, sl improved. antivert po. Patient remains unable to stand/walk on recheck, even w assistance. Will get MRI r/o posterior cva.   Patient is tolerating po fluids, food. No lightheadedness or near syncope. Denies headache. No cp or sob. No abd pain. No fever or chills. Await MRI.  38 mri pending, signed out to Dr Tyrone Nine to check mri and dispo appropriately.     Final Clinical Impression(s) / ED Diagnoses Final diagnoses:  None    Rx / DC Orders ED Discharge Orders    None       Lajean Saver, MD 03/30/19 [phone]

## 2019-04-05 ENCOUNTER — Other Ambulatory Visit: Payer: Self-pay

## 2019-04-05 ENCOUNTER — Emergency Department (HOSPITAL_COMMUNITY)
Admission: EM | Admit: 2019-04-05 | Discharge: 2019-04-05 | Disposition: A | Discharge status: Home / Self Care | Payer: Medicare Other | Attending: Emergency Medicine | Admitting: Emergency Medicine

## 2019-04-05 DIAGNOSIS — I5042 Chronic combined systolic (congestive) and diastolic (congestive) heart failure: Secondary | ICD-10-CM | POA: Insufficient documentation

## 2019-04-05 DIAGNOSIS — E1122 Type 2 diabetes mellitus with diabetic chronic kidney disease: Secondary | ICD-10-CM | POA: Insufficient documentation

## 2019-04-05 DIAGNOSIS — Z87891 Personal history of nicotine dependence: Secondary | ICD-10-CM | POA: Diagnosis not present

## 2019-04-05 DIAGNOSIS — Z992 Dependence on renal dialysis: Secondary | ICD-10-CM | POA: Insufficient documentation

## 2019-04-05 DIAGNOSIS — Z96652 Presence of left artificial knee joint: Secondary | ICD-10-CM | POA: Insufficient documentation

## 2019-04-05 DIAGNOSIS — Z7982 Long term (current) use of aspirin: Secondary | ICD-10-CM | POA: Diagnosis not present

## 2019-04-05 DIAGNOSIS — I2510 Atherosclerotic heart disease of native coronary artery without angina pectoris: Secondary | ICD-10-CM | POA: Diagnosis not present

## 2019-04-05 DIAGNOSIS — Z79899 Other long term (current) drug therapy: Secondary | ICD-10-CM | POA: Diagnosis not present

## 2019-04-05 DIAGNOSIS — I132 Hypertensive heart and chronic kidney disease with heart failure and with stage 5 chronic kidney disease, or end stage renal disease: Secondary | ICD-10-CM | POA: Diagnosis not present

## 2019-04-05 DIAGNOSIS — N186 End stage renal disease: Secondary | ICD-10-CM | POA: Diagnosis not present

## 2019-04-05 DIAGNOSIS — R410 Disorientation, unspecified: Secondary | ICD-10-CM | POA: Diagnosis present

## 2019-04-05 DIAGNOSIS — E11649 Type 2 diabetes mellitus with hypoglycemia without coma: Secondary | ICD-10-CM | POA: Insufficient documentation

## 2019-04-05 DIAGNOSIS — E162 Hypoglycemia, unspecified: Secondary | ICD-10-CM

## 2019-04-05 LAB — CBC
HCT: 35.2 % — ABNORMAL LOW (ref 36.0–46.0)
Hemoglobin: 10.8 g/dL — ABNORMAL LOW (ref 12.0–15.0)
MCH: 27.3 pg (ref 26.0–34.0)
MCHC: 30.7 g/dL (ref 30.0–36.0)
MCV: 88.9 fL (ref 80.0–100.0)
Platelets: 201 10*3/uL (ref 150–400)
RBC: 3.96 MIL/uL (ref 3.87–5.11)
RDW: 19.5 % — ABNORMAL HIGH (ref 11.5–15.5)
WBC: 5.9 10*3/uL (ref 4.0–10.5)
nRBC: 0 % (ref 0.0–0.2)

## 2019-04-05 LAB — COMPREHENSIVE METABOLIC PANEL
ALT: 25 U/L (ref 0–44)
AST: 32 U/L (ref 15–41)
Albumin: 3.4 g/dL — ABNORMAL LOW (ref 3.5–5.0)
Alkaline Phosphatase: 70 U/L (ref 38–126)
Anion gap: 14 (ref 5–15)
BUN: 25 mg/dL — ABNORMAL HIGH (ref 8–23)
CO2: 32 mmol/L (ref 22–32)
Calcium: 8.7 mg/dL — ABNORMAL LOW (ref 8.9–10.3)
Chloride: 93 mmol/L — ABNORMAL LOW (ref 98–111)
Creatinine, Ser: 6.22 mg/dL — ABNORMAL HIGH (ref 0.44–1.00)
GFR calc Af Amer: 7 mL/min — ABNORMAL LOW (ref >60)
GFR calc non Af Amer: 6 mL/min — ABNORMAL LOW (ref >60)
Glucose, Bld: 112 mg/dL — ABNORMAL HIGH (ref 70–99)
Potassium: 4.6 mmol/L (ref 3.5–5.1)
Sodium: 139 mmol/L (ref 135–145)
Total Bilirubin: 0.7 mg/dL (ref 0.3–1.2)
Total Protein: 7.3 g/dL (ref 6.5–8.1)

## 2019-04-05 LAB — CBG MONITORING, ED
Glucose-Capillary: 78 mg/dL (ref 70–99)
Glucose-Capillary: 99 mg/dL (ref 70–99)

## 2019-04-05 NOTE — ED Provider Notes (Signed)
[facility] EMERGENCY DEPARTMENT Provider Note   CSN: [id number] Arrival date & time: 04/05/19  1136     History Chief Complaint  Patient presents with  . Hypoglycemia    [name] is a 74 y.o. female.  HPI    A 74 year old female end-stage renal disease, on dialysis, history of diabetes but no longer on any medications presents today with episode of hypoglycemia at dialysis today.  Per nursing report, patient was confused and they checked her blood sugar there was 72.  Repeat was in the 80s.  Patient states she not eat as much as usual for planned dialysis today.  However, she denies nausea, vomiting, or diarrhea.  She denies any fever chills, chest pain, headache, abdominal pain, cough. Past Medical History:  Diagnosis Date  . Abdominal abscess 12-17-10   abdominal abscesses x2 ? one at this time  . Anemia   . Arthritis    "all over" (08/23/2012)  . Blind right eye    fell and crushed socket and eye fell out, was replaced at [facility]  . CHF (congestive heart failure) ([place])   . Chronic lower back pain   . Coronary artery disease    normal coronaries by 08/25/12 cath  . Dyspnea   . Dysrhythmia    irregular, "skips beats"  . ESRD (end stage renal disease) on dialysis ([place])    "started 12/2011; [place]; TTS" (08/23/2012  . Exertional shortness of breath   . Eye drainage    "lots; since fall 07/2011" (08/23/2012)  . Gout   . H/O hiatal hernia   . Headache(784.0)    "weekly sometimes; since I fell and hit my head in 07/2011 08/23/2012)  . Heart murmur   . History of blood transfusion    "lots before starting dialysis" (08/23/2012)  . Hypertension    sees Dr. Willey Blade  . Inhalation injury    "worked at [employer]; can't inhale polyurethane or paint, etc" (08/23/2012)  . Myocardial infarction ([place]) 1970's or 1980's  . Neuromuscular disorder ([place])    diabetic neuropathy  . OSA on CPAP   . Staph aureus infection November 2012   . Swelling of both  ankles 12-17-10  . Type II diabetes mellitus ([place])    "over 10 years now" (08/23/2012)    Patient Active Problem List   Diagnosis Date Noted  . Fluid overload 10/05/2018  . Shortness of breath 06/17/2018  . Acute left systolic heart failure ([place]) 06/17/2018  . Eye pain, left 01/25/2018  . Acute bronchitis due to Rhinovirus 01/18/2018  . Respiratory distress, acute 01/18/2018  . Essential hypertension 01/17/2018  . HLD (hyperlipidemia) 01/17/2018  . Chronic diastolic CHF (congestive heart failure) ([place]) 01/16/2018  . Centrilobular emphysema ([place]) 01/16/2018  . Acute bronchitis 01/16/2018  . Heme + stool 08/24/2017  . Iron deficiency anemia, unspecified 08/24/2017  . Chest pain 04/06/2017  . Unstable angina ([place]) 04/02/2017  . Hypoxemia 10/13/2016  . Acute on chronic combined systolic and diastolic CHF (congestive heart failure) ([place]) 10/13/2016  . Anemia due to chronic kidney disease 10/13/2016  . Encephalopathy, metabolic   . Tremor   . Weakness   . Acute hyperkalemia 12/04/2014  . Neurological deficit present 12/04/2014  . Hypercalcemia 12/04/2014  . OSA    . Myocardial infarction ([place])   . Blind right eye   . Coronary artery disease   . CHF (congestive heart failure) ([place])   . ESRD needing dialysis ([place])   . Hyperkalemia 08/05/2014  .  Type II diabetes mellitus with renal manifestations ([place]) 01/18/2014  . Arm pain 02/27/2012  . Other complications due to renal dialysis device, implant, and graft 02/13/2012  . Pain in limb 11/21/2011  . Swollen limb 11/21/2011  . Itching 11/21/2011  . End stage renal disease ([place]) 11/07/2011  . Chronic kidney disease, stage IV (severe) ([place]) 10/24/2011  . Chronic kidney disease (CKD), stage IV (severe) ([place]) 08/29/2011    Past Surgical History:  Procedure Laterality Date  . [place] VITRECTOMY WITH 20 GAUGE MVR PORT Left 05/07/2017   Procedure: 25 GAUGE PARS PLANA VITRECTOMY WITH ENDOLASER LEFT EYE;  Surgeon: Bernarda Caffey, MD;  Location: [facility];  Service: Ophthalmology;  Laterality: Left;  . APPENDECTOMY    . AV FISTULA PLACEMENT  09/30/2011   Procedure: ARTERIOVENOUS (AV) FISTULA CREATION;  Surgeon: Conrad [place], MD;  Location: [facility];  Service: Vascular;  Laterality: Left;  BRACHIAL-CEPHALIC  . BACK SURGERY    . BASCILIC VEIN TRANSPOSITION Left 11/10/2011   Left arm   . [place] TRANSPOSITION  01/28/2012   Procedure: BASCILIC VEIN TRANSPOSITION;  Surgeon: Conrad [place], MD;  Location: [facility];  Service: Vascular;  Laterality: Left;  Second Stage   . BIOPSY  08/24/2017   Procedure: BIOPSY;  Surgeon: Wilford Corner, MD;  Location: [facility];  Service: Endoscopy;;  . CARDIAC CATHETERIZATION  1980's  . CATARACT EXTRACTION W/ INTRAOCULAR LENS IMPLANT Bilateral 1990's  . COLONOSCOPY WITH PROPOFOL N/A 08/24/2017   Procedure: COLONOSCOPY WITH PROPOFOL;  Surgeon: Wilford Corner, MD;  [facility];  Service: Endoscopy;  Laterality: N/A;  . CORONARY ANGIOGRAPHY N/A 04/07/2017   Procedure: CORONARY ANGIOGRAPHY (CATH LAB);  Surgeon: Dixie Dials, MD;  Location: [facility] CV [facility];  Service: Cardiovascular;  Laterality: N/A;  . DILATION AND CURETTAGE OF UTERUS  1960's  . ESOPHAGOGASTRODUODENOSCOPY (EGD) WITH PROPOFOL N/A 08/24/2017   Procedure: ESOPHAGOGASTRODUODENOSCOPY (EGD) WITH PROPOFOL;  Surgeon: Wilford Corner, MD;  [facility];  Service: Endoscopy;  Laterality: N/A;  . EYE SURGERY    . FISTULA SUPERFICIALIZATION Left 02/18/2017   Procedure: SUPERFICIALIZATION LEFT ARM ARTERIOVENOUS FISTULA;  Surgeon: Serafina Mitchell, MD;  Location: [facility];  Service: Vascular;  Laterality: Left;  . HERNIA REPAIR    . INSERTION OF DIALYSIS CATHETER  01/05/2012   Procedure: INSERTION OF DIALYSIS CATHETER;  Surgeon: Conrad [place], MD;  Location: [facility];  Service: Vascular;  Laterality: N/A;  Right Internal Jugular Placement  . INTRAOCULAR PROSTHESES INSERTION Right 2013   "fell; knocked my eye outl"  (08/23/2012)  . JOINT REPLACEMENT    . LAPAROTOMY  12/18/2010   Procedure: EXPLORATORY LAPAROTOMY;  Surgeon: Rolm Bookbinder, MD;  Location: [facility];  Service: General;  Laterality: N/A;  Abdominal Seroma Evacuation  . LEFT HEART CATHETERIZATION WITH CORONARY ANGIOGRAM N/A 08/25/2012   Procedure: LEFT HEART CATHETERIZATION WITH CORONARY ANGIOGRAM;  Surgeon: Birdie Riddle, MD;  Location: [facility] CATH LAB;  Service: Cardiovascular;  Laterality: N/A;  . LUMBAR DISC SURGERY  1970'-80's   X 3  . TOTAL KNEE ARTHROPLASTY Left 1980's  . VAGINAL HYSTERECTOMY  1970's  . VENTRAL HERNIA REPAIR  2012-2013   component separation, repair with biologic; "had 3 surgeries to fix it" (08/23/2012)  . WOUND DEBRIDEMENT  03/20/2011   Procedure: DEBRIDEMENT ABDOMINAL WOUND;  Surgeon: Rolm Bookbinder, MD;  Location: [facility];  Service: General;  Laterality: N/A;  debridement abdominal wall, placement of wound vac     OB History   No obstetric  history on file.     Family History  Problem Relation Age of Onset  . Hypertension Mother   . Cancer Brother        spine  . Hypertension Father   . Cancer Sister        brain  . Hypertension Maternal Grandmother   . Anesthesia problems Neg Hx     Social History   Tobacco Use  . Smoking status: Former Smoker    Packs/day: 0.50    Years: 20.00    Pack years: 10.00    Types: Cigarettes    Quit date: 12/16/1980    Years since quitting: 38.3  . Smokeless tobacco: Never Used  Substance Use Topics  . Alcohol use: No  . Drug use: No    Home Medications Prior to Admission medications   Medication Sig Start Date End Date Taking? Authorizing Provider  acetaminophen (TYLENOL) 325 MG tablet Take 2 tablets (650 mg total) by mouth every 6 (six) hours as needed for mild pain (or Fever >/= 101). 10/12/18   Arrien, Jimmy Picket, MD  albuterol (PROVENTIL) (2.5 MG/3ML) 0.083% nebulizer solution Take 3 mLs (2.5 mg total) by nebulization every 4 (four) hours as needed for  shortness of breath. 06/22/18   Dixie Dials, MD  allopurinol (ZYLOPRIM) 100 MG tablet Take 0.5 tablets (50 mg total) by mouth daily. Patient taking differently: Take 50 mg by mouth daily as needed (for gout flares).  06/23/18   Dixie Dials, MD  ALPRAZolam Duanne Moron) 0.25 MG tablet Take 0.25 mg by mouth as needed for anxiety. 10/12/18   [provider]  aspirin EC 81 MG tablet Take 81 mg by mouth daily.      [provider]  atorvastatin (LIPITOR) 40 MG tablet Take 40 mg by mouth daily.    [provider]  budesonide-formoterol (SYMBICORT) 160-4.5 MCG/ACT inhaler Inhale 2 puffs into the lungs 2 (two) times daily. 06/07/18   Lauraine Rinne, NP  cinacalcet (SENSIPAR) 60 MG tablet Take 60 mg by mouth See admin instructions. Take 60 mg by mouth AFTER each dialysis treatment on Tues/Thurs/Sat    [provider]  cloNIDine (CATAPRES) 0.1 MG tablet Take 0.1 mg by mouth in the morning, at noon, in the evening, and at bedtime.  03/26/19   [provider]  doxercalciferol (HECTOROL) 4 MCG/2ML injection Inject 1.5 mLs (3 mcg total) into the vein Every Tuesday,Thursday,and Saturday with dialysis. 06/22/18   Dixie Dials, MD  HYDROcodone-acetaminophen (NORCO/VICODIN) 5-325 MG tablet Take 1 tablet by mouth every 8 (eight) hours as needed. Patient not taking: Reported on 03/30/2019 03/02/19   Couture, Cortni S, PA-C  meclizine (ANTIVERT) 50 MG tablet Take 1 tablet (50 mg total) by mouth 3 (three) times daily as needed. 03/30/19   Deno Etienne, DO  methocarbamol (ROBAXIN) 500 MG tablet Take 500 mg by mouth 3 (three) times daily as needed for muscle spasms. 03/21/19   [provider]  midodrine (PROAMATINE) 10 MG tablet Take 10 mg by mouth See admin instructions. Take 10 mg by mouth "just prior to dialysis, then another 10 mg mid-treatment"- Tues/Thurs/Sat 04/26/17   [provider]  Multiple Vitamins-Minerals (ONE-A-DAY WOMENS PO) Take 1 tablet by mouth daily with  breakfast.    [provider]  oxyCODONE-acetaminophen (PERCOCET/ROXICET) 5-325 MG tablet Take 1 tablet by mouth every 6 (six) hours as needed for moderate pain. 10/12/18   Arrien, Jimmy Picket, MD  polyethylene glycol (MIRALAX / GLYCOLAX) 17 g packet Take 17 g by  mouth daily. Patient taking differently: Take 17 g by mouth daily as needed for mild constipation.  10/13/18   Arrien, Jimmy Picket, MD  sevelamer carbonate (RENVELA) 800 MG tablet Take 4 tablets (3,200 mg total) by mouth 3 (three) times daily with meals. 06/22/18   Dixie Dials, MD  Tiotropium Bromide Monohydrate (SPIRIVA RESPIMAT) 2.5 MCG/ACT AERS Inhale 2 puffs into the lungs daily. Patient taking differently: Inhale 1 puff into the lungs daily.  06/07/18   Lauraine Rinne, NP    Allergies    Adhesive [tape] and Lyrica [pregabalin]  Review of Systems   Review of Systems  All other systems reviewed and are negative.   Physical Exam Updated Vital Signs BP (!) 181/94 (BP Location: Right Arm)   Pulse 71   Temp 98 F (36.7 C) (Oral)   Resp 18   Ht 1.702 m (5\' 7" )   Wt 115.2 kg   SpO2 100%   BMI 39.78 kg/m   Physical Exam Vitals and nursing note reviewed.  Constitutional:      General: She is not in acute distress.    Appearance: She is obese. She is not ill-appearing.  HENT:     Head: Normocephalic.     Right Ear: External ear normal.     Left Ear: External ear normal.     Nose: Nose normal.     Mouth/Throat:     Mouth: Mucous membranes are dry.  Cardiovascular:     Rate and Rhythm: Normal rate and regular rhythm.     Pulses: Normal pulses.  Pulmonary:     Effort: Pulmonary effort is normal.     Breath sounds: Normal breath sounds.  Abdominal:     General: Abdomen is flat.     Palpations: Abdomen is soft.  Musculoskeletal:        General: Normal range of motion.     Cervical back: Normal range of motion.  Skin:    General: Skin is warm and dry.     Capillary Refill: Capillary refill takes less  than 2 seconds.  Neurological:     General: No focal deficit present.     Mental Status: She is alert.  Psychiatric:        Mood and Affect: Mood normal.     ED Results / Procedures / Treatments   Labs (all labs ordered are listed, but only abnormal results are displayed) Labs Reviewed  CBG MONITORING, ED    EKG None  Radiology No results found.  Procedures Procedures (including critical care time)  Medications Ordered in ED Medications - No data to display  ED Course  I have reviewed the triage vital signs and the nursing notes.  Pertinent labs & imaging results that were available during my care of the patient were reviewed by me and considered in my medical decision making (see chart for details).  Clinical Course as of Apr 04 1404  Tue Apr 05, 2019  1320 Cbc, cbg reviewed   [DR]    Clinical Course User Index [DR] Pattricia Boss, MD  74 year old female on dialysis presents today with borderline hypoglycemia.  She had some decreased p.o. intake today.  Here she has been taking p.o. without difficulty.  Blood sugars have been up around 100.  She is not on any medications which should cause hypoglycemia.  She appears stable for discharge home.  She is advised to have a caregiver with her, eat frequently, and check blood sugars.  She can return to the ED if  she is having any problems. MDM Rules/Calculators/A&P                       Final Clinical Impression(s) / ED Diagnoses Final diagnoses:  Hypoglycemia    Rx / DC Orders ED Discharge Orders    None       Pattricia Boss, MD 04/05/19 1409

## 2019-04-05 NOTE — ED Triage Notes (Signed)
Pt BIB [facility] from dialysis, completed trx today, found to be confused/disoriented to time and event upon completion, CBG 72, given 15g glucose, 87 upon reassessment. Pt slightly crosseyed, not baseline, per dialysis. Pt A&Ox4, mildly confused now, hypertensive 181/94, VS otherwise stable. NAD noted.

## 2019-04-05 NOTE — Discharge Instructions (Addendum)
Please take frequent small meals and drinks throughout the day.  Check your blood sugar hourly for the next 8 hours.  Check your blood sugar at least once during the night. Return to the emergency department if you are having any weakness, confusion, or blood sugars less than 75.

## 2019-04-09 ENCOUNTER — Emergency Department (HOSPITAL_COMMUNITY): Payer: Medicare Other

## 2019-04-09 ENCOUNTER — Inpatient Hospital Stay (HOSPITAL_COMMUNITY)
Admission: EM | Admit: 2019-04-09 | Discharge: 2019-04-21 | DRG: 207 | Disposition: E | Payer: Medicare Other | Attending: Internal Medicine | Admitting: Internal Medicine

## 2019-04-09 DIAGNOSIS — E1140 Type 2 diabetes mellitus with diabetic neuropathy, unspecified: Secondary | ICD-10-CM | POA: Diagnosis present

## 2019-04-09 DIAGNOSIS — S00532A Contusion of oral cavity, initial encounter: Secondary | ICD-10-CM | POA: Diagnosis present

## 2019-04-09 DIAGNOSIS — Z79899 Other long term (current) drug therapy: Secondary | ICD-10-CM

## 2019-04-09 DIAGNOSIS — Z7982 Long term (current) use of aspirin: Secondary | ICD-10-CM

## 2019-04-09 DIAGNOSIS — E11649 Type 2 diabetes mellitus with hypoglycemia without coma: Secondary | ICD-10-CM | POA: Diagnosis not present

## 2019-04-09 DIAGNOSIS — J9600 Acute respiratory failure, unspecified whether with hypoxia or hypercapnia: Secondary | ICD-10-CM

## 2019-04-09 DIAGNOSIS — B9561 Methicillin susceptible Staphylococcus aureus infection as the cause of diseases classified elsewhere: Secondary | ICD-10-CM | POA: Diagnosis present

## 2019-04-09 DIAGNOSIS — Z9911 Dependence on respirator [ventilator] status: Secondary | ICD-10-CM

## 2019-04-09 DIAGNOSIS — Z8673 Personal history of transient ischemic attack (TIA), and cerebral infarction without residual deficits: Secondary | ICD-10-CM

## 2019-04-09 DIAGNOSIS — Z90710 Acquired absence of both cervix and uterus: Secondary | ICD-10-CM

## 2019-04-09 DIAGNOSIS — M545 Low back pain: Secondary | ICD-10-CM | POA: Diagnosis present

## 2019-04-09 DIAGNOSIS — Z6841 Body Mass Index (BMI) 40.0 and over, adult: Secondary | ICD-10-CM

## 2019-04-09 DIAGNOSIS — J9601 Acute respiratory failure with hypoxia: Secondary | ICD-10-CM | POA: Diagnosis not present

## 2019-04-09 DIAGNOSIS — Z96652 Presence of left artificial knee joint: Secondary | ICD-10-CM | POA: Diagnosis present

## 2019-04-09 DIAGNOSIS — J690 Pneumonitis due to inhalation of food and vomit: Secondary | ICD-10-CM | POA: Diagnosis present

## 2019-04-09 DIAGNOSIS — M109 Gout, unspecified: Secondary | ICD-10-CM | POA: Diagnosis present

## 2019-04-09 DIAGNOSIS — Z8249 Family history of ischemic heart disease and other diseases of the circulatory system: Secondary | ICD-10-CM

## 2019-04-09 DIAGNOSIS — Z7189 Other specified counseling: Secondary | ICD-10-CM

## 2019-04-09 DIAGNOSIS — Z4659 Encounter for fitting and adjustment of other gastrointestinal appliance and device: Secondary | ICD-10-CM

## 2019-04-09 DIAGNOSIS — R739 Hyperglycemia, unspecified: Secondary | ICD-10-CM

## 2019-04-09 DIAGNOSIS — E1122 Type 2 diabetes mellitus with diabetic chronic kidney disease: Secondary | ICD-10-CM | POA: Diagnosis present

## 2019-04-09 DIAGNOSIS — Z20822 Contact with and (suspected) exposure to covid-19: Secondary | ICD-10-CM | POA: Diagnosis present

## 2019-04-09 DIAGNOSIS — R4182 Altered mental status, unspecified: Secondary | ICD-10-CM

## 2019-04-09 DIAGNOSIS — E875 Hyperkalemia: Secondary | ICD-10-CM | POA: Diagnosis present

## 2019-04-09 DIAGNOSIS — J449 Chronic obstructive pulmonary disease, unspecified: Secondary | ICD-10-CM | POA: Diagnosis present

## 2019-04-09 DIAGNOSIS — G92 Toxic encephalopathy: Secondary | ICD-10-CM | POA: Diagnosis present

## 2019-04-09 DIAGNOSIS — Z87891 Personal history of nicotine dependence: Secondary | ICD-10-CM

## 2019-04-09 DIAGNOSIS — I5032 Chronic diastolic (congestive) heart failure: Secondary | ICD-10-CM | POA: Diagnosis present

## 2019-04-09 DIAGNOSIS — G253 Myoclonus: Secondary | ICD-10-CM | POA: Diagnosis present

## 2019-04-09 DIAGNOSIS — I252 Old myocardial infarction: Secondary | ICD-10-CM

## 2019-04-09 DIAGNOSIS — E872 Acidosis: Secondary | ICD-10-CM | POA: Diagnosis present

## 2019-04-09 DIAGNOSIS — I4891 Unspecified atrial fibrillation: Secondary | ICD-10-CM | POA: Diagnosis not present

## 2019-04-09 DIAGNOSIS — R778 Other specified abnormalities of plasma proteins: Secondary | ICD-10-CM | POA: Diagnosis not present

## 2019-04-09 DIAGNOSIS — N186 End stage renal disease: Secondary | ICD-10-CM | POA: Diagnosis present

## 2019-04-09 DIAGNOSIS — E049 Nontoxic goiter, unspecified: Secondary | ICD-10-CM | POA: Diagnosis present

## 2019-04-09 DIAGNOSIS — H5461 Unqualified visual loss, right eye, normal vision left eye: Secondary | ICD-10-CM | POA: Diagnosis present

## 2019-04-09 DIAGNOSIS — Z992 Dependence on renal dialysis: Secondary | ICD-10-CM

## 2019-04-09 DIAGNOSIS — M1990 Unspecified osteoarthritis, unspecified site: Secondary | ICD-10-CM | POA: Diagnosis present

## 2019-04-09 DIAGNOSIS — I2510 Atherosclerotic heart disease of native coronary artery without angina pectoris: Secondary | ICD-10-CM | POA: Diagnosis present

## 2019-04-09 DIAGNOSIS — D631 Anemia in chronic kidney disease: Secondary | ICD-10-CM | POA: Diagnosis present

## 2019-04-09 DIAGNOSIS — I132 Hypertensive heart and chronic kidney disease with heart failure and with stage 5 chronic kidney disease, or end stage renal disease: Secondary | ICD-10-CM | POA: Diagnosis present

## 2019-04-09 DIAGNOSIS — G9340 Encephalopathy, unspecified: Secondary | ICD-10-CM | POA: Diagnosis not present

## 2019-04-09 DIAGNOSIS — Z515 Encounter for palliative care: Secondary | ICD-10-CM | POA: Diagnosis not present

## 2019-04-09 DIAGNOSIS — E079 Disorder of thyroid, unspecified: Secondary | ICD-10-CM

## 2019-04-09 DIAGNOSIS — N2581 Secondary hyperparathyroidism of renal origin: Secondary | ICD-10-CM | POA: Diagnosis present

## 2019-04-09 DIAGNOSIS — G931 Anoxic brain damage, not elsewhere classified: Secondary | ICD-10-CM | POA: Diagnosis present

## 2019-04-09 DIAGNOSIS — G9341 Metabolic encephalopathy: Secondary | ICD-10-CM

## 2019-04-09 DIAGNOSIS — G4733 Obstructive sleep apnea (adult) (pediatric): Secondary | ICD-10-CM | POA: Diagnosis present

## 2019-04-09 DIAGNOSIS — G8929 Other chronic pain: Secondary | ICD-10-CM | POA: Diagnosis present

## 2019-04-09 DIAGNOSIS — Z66 Do not resuscitate: Secondary | ICD-10-CM | POA: Diagnosis not present

## 2019-04-09 DIAGNOSIS — I4892 Unspecified atrial flutter: Secondary | ICD-10-CM | POA: Diagnosis not present

## 2019-04-09 DIAGNOSIS — Z970 Presence of artificial eye: Secondary | ICD-10-CM

## 2019-04-09 DIAGNOSIS — M48061 Spinal stenosis, lumbar region without neurogenic claudication: Secondary | ICD-10-CM | POA: Diagnosis present

## 2019-04-09 DIAGNOSIS — Z7951 Long term (current) use of inhaled steroids: Secondary | ICD-10-CM

## 2019-04-09 LAB — COMPREHENSIVE METABOLIC PANEL
ALT: 25 U/L (ref 0–44)
AST: 33 U/L (ref 15–41)
Albumin: 3 g/dL — ABNORMAL LOW (ref 3.5–5.0)
Alkaline Phosphatase: 79 U/L (ref 38–126)
Anion gap: 21 — ABNORMAL HIGH (ref 5–15)
BUN: 81 mg/dL — ABNORMAL HIGH (ref 8–23)
CO2: 19 mmol/L — ABNORMAL LOW (ref 22–32)
Calcium: 8.7 mg/dL — ABNORMAL LOW (ref 8.9–10.3)
Chloride: 101 mmol/L (ref 98–111)
Creatinine, Ser: 10.67 mg/dL — ABNORMAL HIGH (ref 0.44–1.00)
GFR calc Af Amer: 4 mL/min — ABNORMAL LOW (ref >60)
GFR calc non Af Amer: 3 mL/min — ABNORMAL LOW (ref >60)
Glucose, Bld: 186 mg/dL — ABNORMAL HIGH (ref 70–99)
Potassium: 6.6 mmol/L (ref 3.5–5.1)
Sodium: 141 mmol/L (ref 135–145)
Total Bilirubin: 0.8 mg/dL (ref 0.3–1.2)
Total Protein: 7.4 g/dL (ref 6.5–8.1)

## 2019-04-09 LAB — POCT I-STAT 7, (LYTES, BLD GAS, ICA,H+H)
Acid-base deficit: 1 mmol/L (ref 0.0–2.0)
Bicarbonate: 26.6 mmol/L (ref 20.0–28.0)
Calcium, Ion: 1.11 mmol/L — ABNORMAL LOW (ref 1.15–1.40)
HCT: 44 % (ref 36.0–46.0)
Hemoglobin: 15 g/dL (ref 12.0–15.0)
O2 Saturation: 93 %
Patient temperature: 100.8
Potassium: 5.2 mmol/L — ABNORMAL HIGH (ref 3.5–5.1)
Sodium: 139 mmol/L (ref 135–145)
TCO2: 28 mmol/L (ref 22–32)
pCO2 arterial: 55.9 mmHg — ABNORMAL HIGH (ref 32.0–48.0)
pH, Arterial: 7.292 — ABNORMAL LOW (ref 7.350–7.450)
pO2, Arterial: 81 mmHg — ABNORMAL LOW (ref 83.0–108.0)

## 2019-04-09 LAB — I-STAT CHEM 8, ED
BUN: 69 mg/dL — ABNORMAL HIGH (ref 8–23)
Calcium, Ion: 0.96 mmol/L — ABNORMAL LOW (ref 1.15–1.40)
Chloride: 105 mmol/L (ref 98–111)
Creatinine, Ser: 11.5 mg/dL — ABNORMAL HIGH (ref 0.44–1.00)
Glucose, Bld: 179 mg/dL — ABNORMAL HIGH (ref 70–99)
HCT: 47 % — ABNORMAL HIGH (ref 36.0–46.0)
Hemoglobin: 16 g/dL — ABNORMAL HIGH (ref 12.0–15.0)
Potassium: 6.2 mmol/L — ABNORMAL HIGH (ref 3.5–5.1)
Sodium: 139 mmol/L (ref 135–145)
TCO2: 27 mmol/L (ref 22–32)

## 2019-04-09 LAB — POCT I-STAT EG7
Bicarbonate: 24.6 mmol/L (ref 20.0–28.0)
Calcium, Ion: 0.98 mmol/L — ABNORMAL LOW (ref 1.15–1.40)
HCT: 46 % (ref 36.0–46.0)
Hemoglobin: 15.6 g/dL — ABNORMAL HIGH (ref 12.0–15.0)
O2 Saturation: 96 %
Potassium: 6.2 mmol/L — ABNORMAL HIGH (ref 3.5–5.1)
Sodium: 139 mmol/L (ref 135–145)
TCO2: 26 mmol/L (ref 22–32)
pCO2, Ven: 39.1 mmHg — ABNORMAL LOW (ref 44.0–60.0)
pH, Ven: 7.406 (ref 7.250–7.430)
pO2, Ven: 83 mmHg — ABNORMAL HIGH (ref 32.0–45.0)

## 2019-04-09 LAB — CBC WITH DIFFERENTIAL/PLATELET
Abs Immature Granulocytes: 0.08 10*3/uL — ABNORMAL HIGH (ref 0.00–0.07)
Basophils Absolute: 0 10*3/uL (ref 0.0–0.1)
Basophils Relative: 0 %
Eosinophils Absolute: 0 10*3/uL (ref 0.0–0.5)
Eosinophils Relative: 0 %
HCT: 46.5 % — ABNORMAL HIGH (ref 36.0–46.0)
Hemoglobin: 14.7 g/dL (ref 12.0–15.0)
Immature Granulocytes: 1 %
Lymphocytes Relative: 14 %
Lymphs Abs: 1.4 10*3/uL (ref 0.7–4.0)
MCH: 27.3 pg (ref 26.0–34.0)
MCHC: 31.6 g/dL (ref 30.0–36.0)
MCV: 86.3 fL (ref 80.0–100.0)
Monocytes Absolute: 0.9 10*3/uL (ref 0.1–1.0)
Monocytes Relative: 8 %
Neutro Abs: 8 10*3/uL — ABNORMAL HIGH (ref 1.7–7.7)
Neutrophils Relative %: 77 %
Platelets: 222 10*3/uL (ref 150–400)
RBC: 5.39 MIL/uL — ABNORMAL HIGH (ref 3.87–5.11)
RDW: 20.4 % — ABNORMAL HIGH (ref 11.5–15.5)
WBC: 10.4 10*3/uL (ref 4.0–10.5)
nRBC: 0.2 % (ref 0.0–0.2)

## 2019-04-09 LAB — CBG MONITORING, ED: Glucose-Capillary: 143 mg/dL — ABNORMAL HIGH (ref 70–99)

## 2019-04-09 LAB — TROPONIN I (HIGH SENSITIVITY): Troponin I (High Sensitivity): 856 ng/L (ref <18)

## 2019-04-09 LAB — RESPIRATORY PANEL BY RT PCR (FLU A&B, COVID)
Influenza A by PCR: NEGATIVE
Influenza B by PCR: NEGATIVE
SARS Coronavirus 2 by RT PCR: NEGATIVE

## 2019-04-09 MED ORDER — MIDAZOLAM HCL 2 MG/2ML IJ SOLN
INTRAMUSCULAR | Status: AC
  Administered 2019-04-09: 2 mg
  Filled 2019-04-09: qty 2

## 2019-04-09 MED ORDER — DEXTROSE 10 % IV SOLN: Freq: Once | INTRAVENOUS | Status: DC

## 2019-04-09 MED ORDER — LACTATED RINGERS IV BOLUS
1000.0000 mL | Freq: Once | INTRAVENOUS | Status: AC
  Administered 2019-04-10: 1000 mL via INTRAVENOUS

## 2019-04-09 MED ORDER — FENTANYL CITRATE (PF) 100 MCG/2ML IJ SOLN
INTRAMUSCULAR | Status: AC | PRN
  Administered 2019-04-09: 100 ug via INTRAVENOUS

## 2019-04-09 MED ORDER — ACETAMINOPHEN 650 MG RE SUPP
650.0000 mg | Freq: Once | RECTAL | Status: DC
  Filled 2019-04-09: qty 1

## 2019-04-09 MED ORDER — INSULIN ASPART 100 UNIT/ML IV SOLN
5.0000 [IU] | Freq: Once | INTRAVENOUS | Status: AC
  Administered 2019-04-10: 5 [IU] via INTRAVENOUS

## 2019-04-09 MED ORDER — PROPOFOL 1000 MG/100ML IV EMUL
INTRAVENOUS | Status: AC
  Administered 2019-04-09: 16 ug/kg/min
  Filled 2019-04-09: qty 100

## 2019-04-09 MED ORDER — ETOMIDATE 2 MG/ML IV SOLN
INTRAVENOUS | Status: AC | PRN
  Administered 2019-04-09: 30 mg via INTRAVENOUS

## 2019-04-09 MED ORDER — SODIUM CHLORIDE 0.9 % IV SOLN
1.0000 g | Freq: Once | INTRAVENOUS | Status: AC
  Administered 2019-04-10: 1 g via INTRAVENOUS
  Filled 2019-04-09: qty 10

## 2019-04-09 MED ORDER — FENTANYL CITRATE (PF) 100 MCG/2ML IJ SOLN
INTRAMUSCULAR | Status: AC
  Filled 2019-04-09: qty 2

## 2019-04-09 MED ORDER — SODIUM CHLORIDE 0.9 % IV SOLN
1.0000 g | Freq: Once | INTRAVENOUS | Status: DC
  Filled 2019-04-09: qty 1

## 2019-04-09 MED ORDER — SODIUM ZIRCONIUM CYCLOSILICATE 10 G PO PACK: 10.0000 g | PACK | Freq: Once | ORAL | Status: DC

## 2019-04-09 MED ORDER — VANCOMYCIN HCL IN DEXTROSE 1-5 GM/200ML-% IV SOLN: 1000.0000 mg | Freq: Once | INTRAVENOUS | Status: DC

## 2019-04-09 MED ORDER — PROPOFOL 1000 MG/100ML IV EMUL
INTRAVENOUS | Status: AC
  Administered 2019-04-10: 20 ug/kg/min via INTRAVENOUS
  Filled 2019-04-09: qty 100

## 2019-04-09 MED ORDER — FENTANYL CITRATE (PF) 100 MCG/2ML IJ SOLN
INTRAMUSCULAR | Status: AC
  Administered 2019-04-09: 100 ug
  Filled 2019-04-09: qty 2

## 2019-04-09 MED ORDER — DEXTROSE 50 % IV SOLN
1.0000 | Freq: Once | INTRAVENOUS | Status: AC
  Administered 2019-04-10: 50 mL via INTRAVENOUS
  Filled 2019-04-09: qty 50

## 2019-04-09 MED ORDER — ROCURONIUM BROMIDE 50 MG/5ML IV SOLN
INTRAVENOUS | Status: AC | PRN
  Administered 2019-04-09: 100 mg via INTRAVENOUS

## 2019-04-09 NOTE — ED Provider Notes (Addendum)
[facility] EMERGENCY DEPARTMENT Provider Note   CSN: [id number] Arrival date & time: 04/08/2019  2102     History Chief Complaint  Patient presents with   Unresponsive    [name] is a 74 y.o. female.  HPI Patient is a 74 year old female with a PMH of ESRD (TThS), CAD, diabetes and hypertension presenting to the ED today due to altered mental status.  Per EMS, patient was last spoken to and found to be normal 4 days ago.  She was found by family members on the ground today with altered mental status.  EMS was called who reports a GCS of 7.  They provided O2 via BVM and say that her SPO2 was in the 60s on arrival.  They also report an SBP in the 200s.  On arrival, patient not responsive.    Past Medical History:  Diagnosis Date   Abdominal abscess 12-17-10   abdominal abscesses x2 ? one at this time   Anemia    Arthritis    "all over" (08/23/2012)   Blind right eye    fell and crushed socket and eye fell out, was replaced at [facility]   CHF (congestive heart failure) ([place])    Chronic lower back pain    Coronary artery disease    normal coronaries by 08/25/12 cath   Dyspnea    Dysrhythmia    irregular, "skips beats"   ESRD (end stage renal disease) on dialysis ([place])    "started 12/2011; [place]; TTS" (08/23/2012   Exertional shortness of breath    Eye drainage    "lots; since fall 07/2011" (08/23/2012)   Gout    H/O hiatal hernia    Headache(784.0)    "weekly sometimes; since I fell and hit my head in 07/2011 08/23/2012)   Heart murmur    History of blood transfusion    "lots before starting dialysis" (08/23/2012)   Hypertension    sees Dr. Willey Blade   Inhalation injury    "worked at [employer]; can't inhale polyurethane or paint, etc" (08/23/2012)   Myocardial infarction ([place]) 1970's or 1980's   Neuromuscular disorder ([place])    diabetic neuropathy   OSA on CPAP    Staph aureus infection November 2012    Swelling of  both ankles 12-17-10   Type II diabetes mellitus ([place])    "over 10 years now" (08/23/2012)    Patient Active Problem List   Diagnosis Date Noted   Fluid overload 10/05/2018   Shortness of breath 06/17/2018   Acute left systolic heart failure ([place]) 06/17/2018   Eye pain, left 01/25/2018   Acute bronchitis due to Rhinovirus 01/18/2018   Respiratory distress, acute 01/18/2018   Essential hypertension 01/17/2018   HLD (hyperlipidemia) 01/17/2018   Chronic diastolic CHF (congestive heart failure) ([place]) 01/16/2018   Centrilobular emphysema ([place]) 01/16/2018   Acute bronchitis 01/16/2018   Heme + stool 08/24/2017   Iron deficiency anemia, unspecified 08/24/2017   Chest pain 04/06/2017   Unstable angina ([place]) 04/02/2017   Hypoxemia 10/13/2016   Acute on chronic combined systolic and diastolic CHF (congestive heart failure) ([place]) 10/13/2016   Anemia due to chronic kidney disease 10/13/2016   Encephalopathy, metabolic    Tremor    Weakness    Acute hyperkalemia 12/04/2014   Neurological deficit present 12/04/2014   Hypercalcemia 12/04/2014   OSA     Myocardial infarction [facility])    Blind right eye    Coronary artery disease    CHF (congestive  heart failure) ([place])    ESRD needing dialysis ([place])    Hyperkalemia 08/05/2014   Type II diabetes mellitus with renal manifestations ([place]) 01/18/2014   Arm pain 50/09/3816   Other complications due to renal dialysis device, implant, and graft 02/13/2012   Pain in limb 11/21/2011   Swollen limb 11/21/2011   Itching 11/21/2011   End stage renal disease ([place]) 11/07/2011   Chronic kidney disease, stage IV (severe) ([place]) 10/24/2011   Chronic kidney disease (CKD), stage IV (severe) ([place]) 08/29/2011    Past Surgical History:  Procedure Laterality Date   25 GAUGE PARS PLANA VITRECTOMY WITH 20 GAUGE MVR PORT Left 05/07/2017   Procedure: 25 GAUGE PARS PLANA VITRECTOMY WITH ENDOLASER LEFT EYE;  Surgeon: Bernarda Caffey, MD;  Location: [place];  Service: Ophthalmology;  Laterality: Left;   APPENDECTOMY     AV FISTULA PLACEMENT  09/30/2011   Procedure: ARTERIOVENOUS (AV) FISTULA CREATION;  Surgeon: Conrad [place], MD;  Location: [place];  Service: Vascular;  Laterality: Left;  BRACHIAL-CEPHALIC   BACK SURGERY     BASCILIC VEIN TRANSPOSITION Left 11/10/2011   Left arm    [place] TRANSPOSITION  01/28/2012   Procedure: BASCILIC VEIN TRANSPOSITION;  Surgeon: Conrad Conway, MD;  Location: [place];  Service: Vascular;  Laterality: Left;  Second Stage    BIOPSY  08/24/2017   Procedure: BIOPSY;  Surgeon: Wilford Corner, MD;  Location: [facility];  Service: Endoscopy;;   CARDIAC CATHETERIZATION  1980's   CATARACT EXTRACTION W/ INTRAOCULAR LENS IMPLANT Bilateral 1990's   COLONOSCOPY WITH PROPOFOL N/A 08/24/2017   Procedure: COLONOSCOPY WITH PROPOFOL;  Surgeon: Wilford Corner, MD;  [facility];  Service: Endoscopy;  Laterality: N/A;   CORONARY ANGIOGRAPHY N/A 04/07/2017   Procedure: CORONARY ANGIOGRAPHY (CATH LAB);  Surgeon: Dixie Dials, MD;  Location: [place] CV LAB;  Service: Cardiovascular;  Laterality: N/A;   DILATION AND CURETTAGE OF UTERUS  1960's   ESOPHAGOGASTRODUODENOSCOPY (EGD) WITH PROPOFOL N/A 08/24/2017   Procedure: ESOPHAGOGASTRODUODENOSCOPY (EGD) WITH PROPOFOL;  Surgeon: Wilford Corner, MD;  [facility];  Service: Endoscopy;  Laterality: N/A;   EYE SURGERY     FISTULA SUPERFICIALIZATION Left 02/18/2017   Procedure: SUPERFICIALIZATION LEFT ARM ARTERIOVENOUS FISTULA;  Surgeon: Serafina Mitchell, MD;  Location: [place];  Service: Vascular;  Laterality: Left;   HERNIA REPAIR     INSERTION OF DIALYSIS CATHETER  01/05/2012   Procedure: INSERTION OF DIALYSIS CATHETER;  Surgeon: Conrad [place], MD;  Location: [place];  Service: Vascular;  Laterality: N/A;  Right Internal Jugular Placement   INTRAOCULAR PROSTHESES INSERTION Right 2013   "fell; knocked my eye outl"  (08/23/2012)   JOINT REPLACEMENT     LAPAROTOMY  12/18/2010   Procedure: EXPLORATORY LAPAROTOMY;  Surgeon: Rolm Bookbinder, MD;  [facility]: [facility];  Service: General;  Laterality: N/A;  Abdominal Seroma Evacuation   LEFT HEART CATHETERIZATION WITH CORONARY ANGIOGRAM N/A 08/25/2012   Procedure: LEFT HEART CATHETERIZATION WITH CORONARY ANGIOGRAM;  Surgeon: Birdie Riddle, MD;  Location: [place] CATH LAB;  Service: Cardiovascular;  Laterality: N/A;   LUMBAR DISC SURGERY  1970'-80's   X 3   TOTAL KNEE ARTHROPLASTY Left 1980's   VAGINAL HYSTERECTOMY  1970's   VENTRAL HERNIA REPAIR  2012-2013   component separation, repair with biologic; "had 3 surgeries to fix it" (08/23/2012)   WOUND DEBRIDEMENT  03/20/2011   Procedure: DEBRIDEMENT ABDOMINAL WOUND;  Surgeon: Rolm Bookbinder, MD;  Location: [place];  Service: General;  Laterality: N/A;  debridement abdominal wall, placement of wound vac     OB History   No obstetric history on file.     Family History  Problem Relation Age of Onset   Hypertension Mother    Cancer Brother        spine   Hypertension Father    Cancer Sister        brain   Hypertension Maternal Grandmother    Anesthesia problems Neg Hx     Social History   Tobacco Use   Smoking status: Former Smoker    Packs/day: 0.50    Years: 20.00    Pack years: 10.00    Types: Cigarettes    Quit date: 12/16/1980    Years since quitting: 38.3   Smokeless tobacco: Never Used  Substance Use Topics   Alcohol use: No   Drug use: No    Home Medications Prior to Admission medications   Medication Sig Start Date End Date Taking? Authorizing Provider  acetaminophen (TYLENOL) 325 MG tablet Take 2 tablets (650 mg total) by mouth every 6 (six) hours as needed for mild pain (or Fever >/= 101). 10/12/18   Arrien, Jimmy Picket, MD  albuterol (PROVENTIL) (2.5 MG/3ML) 0.083% nebulizer solution Take 3 mLs (2.5 mg total) by nebulization every 4 (four) hours as needed for  shortness of breath. 06/22/18   Dixie Dials, MD  allopurinol (ZYLOPRIM) 100 MG tablet Take 0.5 tablets (50 mg total) by mouth daily. Patient taking differently: Take 50 mg by mouth daily as needed (for gout flares).  06/23/18   Dixie Dials, MD  ALPRAZolam Duanne Moron) 0.25 MG tablet Take 0.25 mg by mouth as needed for anxiety. 10/12/18   [provider]  aspirin EC 81 MG tablet Take 81 mg by mouth daily.      [provider]  atorvastatin (LIPITOR) 40 MG tablet Take 40 mg by mouth daily.    [provider]  budesonide-formoterol (SYMBICORT) 160-4.5 MCG/ACT inhaler Inhale 2 puffs into the lungs 2 (two) times daily. 06/07/18   Lauraine Rinne, NP  cinacalcet (SENSIPAR) 60 MG tablet Take 60 mg by mouth See admin instructions. Take 60 mg by mouth AFTER each dialysis treatment on Tues/Thurs/Sat    [provider]  cloNIDine (CATAPRES) 0.1 MG tablet Take 0.1 mg by mouth in the morning, at noon, in the evening, and at bedtime.  03/26/19   [provider]  doxercalciferol (HECTOROL) 4 MCG/2ML injection Inject 1.5 mLs (3 mcg total) into the vein Every Tuesday,Thursday,and Saturday with dialysis. 06/22/18   Dixie Dials, MD  HYDROcodone-acetaminophen (NORCO/VICODIN) 5-325 MG tablet Take 1 tablet by mouth every 8 (eight) hours as needed. Patient not taking: Reported on 03/30/2019 03/02/19   Couture, Cortni S, PA-C  meclizine (ANTIVERT) 50 MG tablet Take 1 tablet (50 mg total) by mouth 3 (three) times daily as needed. 03/30/19   Deno Etienne, DO  methocarbamol (ROBAXIN) 500 MG tablet Take 500 mg by mouth 3 (three) times daily as needed for muscle spasms. 03/21/19   [provider]  midodrine (PROAMATINE) 10 MG tablet Take 10 mg by mouth See admin instructions. Take 10 mg by mouth "just prior to dialysis, then another 10 mg mid-treatment"- Tues/Thurs/Sat 04/26/17   [provider]  Multiple Vitamins-Minerals (ONE-A-DAY WOMENS PO) Take 1 tablet by mouth daily with  breakfast.    [provider]  oxyCODONE-acetaminophen (PERCOCET/ROXICET) 5-325 MG tablet Take 1 tablet by mouth every 6 (six) hours as needed for moderate pain. 10/12/18  Arrien, Jimmy Picket, MD  polyethylene glycol (MIRALAX / GLYCOLAX) 17 g packet Take 17 g by mouth daily. Patient taking differently: Take 17 g by mouth daily as needed for mild constipation.  10/13/18   Arrien, Jimmy Picket, MD  sevelamer carbonate (RENVELA) 800 MG tablet Take 4 tablets (3,200 mg total) by mouth 3 (three) times daily with meals. 06/22/18   Dixie Dials, MD  Tiotropium Bromide Monohydrate (SPIRIVA RESPIMAT) 2.5 MCG/ACT AERS Inhale 2 puffs into the lungs daily. Patient taking differently: Inhale 1 puff into the lungs daily.  06/07/18   Lauraine Rinne, NP    Allergies    Adhesive [tape] and Lyrica [pregabalin]  Review of Systems   Review of Systems  Unable to perform ROS: Mental status change    Physical Exam Updated Vital Signs BP (!) 204/110 (BP Location: Right Arm)    Pulse (!) 107    Resp (!) 25    SpO2 100%   Physical Exam Vitals and nursing note reviewed.  Constitutional:      General: She is in acute distress.     Appearance: She is well-developed. She is obese. She is ill-appearing.     Comments: Patient somnolent.  HENT:     Head: Normocephalic and atraumatic.     Right Ear: External ear normal.     Left Ear: External ear normal.     Nose: Nose normal. No congestion or rhinorrhea.     Comments: Nasal trumpet in right nare.    Mouth/Throat:     Mouth: Mucous membranes are moist.     Comments: Profuse drooling and oral secretions.  Patient appears to have bitten her left tongue. Eyes:     Comments: Right eyes prosthetic.  Left pupil is 4 mm and sluggishly reactive to light.  Cardiovascular:     Rate and Rhythm: Regular rhythm. Tachycardia present.     Pulses: Normal pulses.  Pulmonary:     Effort: Respiratory distress present.     Comments: Shallow respirations. Abdominal:      General: There is no distension.     Palpations: Abdomen is soft.  Musculoskeletal:        General: No signs of injury.     Cervical back: Normal range of motion.  Skin:    General: Skin is warm and dry.  Neurological:     Comments: She is opening her eyes spontaneously but is not tracking. She is not making any sounds. She is noted to move her left upper extremity spontaneously but was not noted to move any other extremity.  She is not responsive to pain.     ED Results / Procedures / Treatments   Labs (all labs ordered are listed, but only abnormal results are displayed) Labs Reviewed  COMPREHENSIVE METABOLIC PANEL - Abnormal; Notable for the following components:      Result Value   Potassium 6.6 (*)    CO2 19 (*)    Glucose, Bld 186 (*)    BUN 81 (*)    Creatinine, Ser 10.67 (*)    Calcium 8.7 (*)    Albumin 3.0 (*)    GFR calc non Af Amer 3 (*)    GFR calc Af Amer 4 (*)    Anion gap 21 (*)    All other components within normal limits  CBC WITH DIFFERENTIAL/PLATELET - Abnormal; Notable for the following components:   RBC 5.39 (*)    HCT 46.5 (*)    RDW 20.4 (*)  Neutro Abs 8.0 (*)    Abs Immature Granulocytes 0.08 (*)    All other components within normal limits  I-STAT CHEM 8, ED - Abnormal; Notable for the following components:   Potassium 6.2 (*)    BUN 69 (*)    Creatinine, Ser 11.50 (*)    Glucose, Bld 179 (*)    Calcium, Ion 0.96 (*)    Hemoglobin 16.0 (*)    HCT 47.0 (*)    All other components within normal limits  CBG MONITORING, ED - Abnormal; Notable for the following components:   Glucose-Capillary 143 (*)    All other components within normal limits  POCT I-STAT EG7 - Abnormal; Notable for the following components:   pCO2, Ven 39.1 (*)    pO2, Ven 83.0 (*)    Potassium 6.2 (*)    Calcium, Ion 0.98 (*)    Hemoglobin 15.6 (*)    All other components within normal limits  POCT I-STAT 7, (LYTES, BLD GAS, ICA,H+H) - Abnormal; Notable for the  following components:   pH, Arterial 7.292 (*)    pCO2 arterial 55.9 (*)    pO2, Arterial 81.0 (*)    Potassium 5.2 (*)    Calcium, Ion 1.11 (*)    All other components within normal limits  TROPONIN I (HIGH SENSITIVITY) - Abnormal; Notable for the following components:   Troponin I (High Sensitivity) 856 (*)    All other components within normal limits  RESPIRATORY PANEL BY RT PCR (FLU A&B, COVID)  CULTURE, BLOOD (ROUTINE X 2)  CULTURE, BLOOD (ROUTINE X 2)  CULTURE, RESPIRATORY  ETHANOL  LACTIC ACID, PLASMA  URINALYSIS, ROUTINE W REFLEX MICROSCOPIC  RAPID URINE DRUG SCREEN, HOSP PERFORMED  PROTIME-INR  BLOOD GAS, ARTERIAL  CBC  BLOOD GAS, ARTERIAL  CBC  BASIC METABOLIC PANEL  MAGNESIUM  PHOSPHORUS  BLOOD GAS, ARTERIAL  TRIGLYCERIDES  BASIC METABOLIC PANEL  BASIC METABOLIC PANEL  TYPE AND SCREEN  TROPONIN I (HIGH SENSITIVITY)    EKG None  Radiology CT Head Wo Contrast  Result Date: 04/15/2019 CLINICAL DATA:  Altered mental status. Patient is on a ventilator. EXAM: CT HEAD WITHOUT CONTRAST TECHNIQUE: Contiguous axial images were obtained from the base of the skull through the vertex without intravenous contrast. COMPARISON:  None. FINDINGS: Brain: There is mild cerebral atrophy with widening of the extra-axial spaces and ventricular dilatation. There are areas of decreased attenuation within the white matter tracts of the supratentorial brain, consistent with microvascular disease changes. Vascular: No hyperdense vessel or unexpected calcification. Skull: Normal. Negative for fracture or focal lesion. Sinuses/Orbits: There is mild to moderate severity bilateral ethmoid sinus mucosal thickening. A prosthetic right globe is noted. This is present on the prior study. Other: Mild to moderate severity right parietal scalp soft tissue swelling is seen. This represents a new finding when compared to the prior exam. IMPRESSION: 1. Mild to moderate severity right parietal scalp soft  tissue swelling. This represents a new finding when compared to the prior study dated March 30, 2019. 2. Findings of microvascular disease, and cerebral atrophy. 3. No acute intracranial process. 4. Ethmoid sinus mucosal thickening. Electronically Signed   By: Virgina Norfolk M.D.   On: 04/15/2019 21:48   CT Cervical Spine Wo Contrast  Result Date: 03/30/2019 CLINICAL DATA:  Status post trauma. EXAM: CT CERVICAL SPINE WITHOUT CONTRAST TECHNIQUE: Multidetector CT imaging of the cervical spine was performed without intravenous contrast. Multiplanar CT image reconstructions were also generated. COMPARISON:  None. FINDINGS: Alignment: Normal. Skull  base and vertebrae: No acute fracture. No primary bone lesion or focal pathologic process. Soft tissues and spinal canal: No prevertebral fluid or swelling. No visible canal hematoma. Disc levels: Very mild multilevel endplate sclerosis is seen with mild anterior osteophyte formation noted at the level of C5-C6. Mild multilevel intervertebral disc space narrowing is seen. Upper chest: The left lobe of the thyroid gland is markedly enlarged and heterogeneous in appearance. Moderate severity calcification of the left lobe of the thyroid gland is also seen. Other: An endotracheal tube is in place. IMPRESSION: 1. No acute fracture or subluxation identified. 2. Mild multilevel degenerative disc disease. 3. Markedly enlarged and heterogeneous left lobe of the thyroid gland. Recommend further evaluation with a nonemergent thyroid ultrasound. Electronically Signed   By: Virgina Norfolk M.D.   On: 03/22/2019 21:55   MR BRAIN WO CONTRAST  Result Date: 04/10/2019 CLINICAL DATA:  Initial evaluation for acute neuro deficit, stroke suspected. EXAM: MRI HEAD WITHOUT CONTRAST TECHNIQUE: Multiplanar, multiecho pulse sequences of the brain and surrounding structures were obtained without intravenous contrast. COMPARISON:  Prior head CT from 04/05/2019 as well as recent MRI from  03/30/2019. FINDINGS: Brain: Mild diffuse prominence of the CSF containing spaces compatible with generalized age-related cerebral atrophy. Patchy T2/FLAIR hyperintensity within the periventricular deep white matter both cerebral hemispheres most consistent with chronic small vessel ischemic disease, moderate in nature, and stable from previous. Superimposed remote lacunar infarcts present at the left basal ganglia, right thalamus, right pons, and right cerebellum. Encephalomalacia she a and gliosis involving the cortical subcortical left occipital lobe compatible with chronic left PCA territory infarct. Additional small chronic posterior right MCA territory infarcts seen involving the right postcentral gyrus. Appearance is stable from previous. No abnormal foci of restricted diffusion to suggest acute or subacute ischemia. Gray-white matter differentiation otherwise maintained. No foci of susceptibility artifact to suggest acute or chronic intracranial hemorrhage. No mass lesion, midline shift or mass effect. No hydrocephalus or extra-axial fluid collection. No made of a partially empty sella. Midline structures intact. Vascular: Major intracranial vascular flow voids are maintained. Skull and upper cervical spine: Craniocervical junction within normal limits. Diffusely decreased T1 signal intensity seen throughout the visualized bone marrow, likely related history of end-stage renal disease. No focal marrow replacing lesion. Diffuse soft tissue swelling seen throughout the right frontoparietal and temporal scalp. Sinuses/Orbits: Right ocular prosthesis noted. Patient status post ocular lens replacement on the left. Diffuse mucosal thickening with scattered air-fluid level seen throughout the paranasal sinuses. Patient appears to be intubated. Trace bilateral mastoid effusions noted. Other: None. IMPRESSION: 1. No acute intracranial abnormality. 2. Diffuse soft tissue swelling throughout the right frontoparietal  and temporal scalp. Query recent trauma. 3. 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 4. Underlying age-related cerebral atrophy with moderate chronic microvascular ischemic disease, with multiple additional remote lacunar infarcts as above. Electronically Signed   By: Jeannine Boga M.D.   On: 04/10/2019 00:47   DG Chest Portable 1 View  Result Date: 04/18/2019 CLINICAL DATA:  Central line placement EXAM: PORTABLE CHEST 1 VIEW COMPARISON:  03/12/2019 FINDINGS: Left central line is been placed. The tip is in the upper SVC. Endotracheal tube is unchanged. Interval removal of NG tube. Cardiomegaly. Improved aeration in the right upper lobe. Continued right upper lobe atelectasis or infiltrate. Vascular congestion. No effusions or acute bony abnormality. IMPRESSION: Left central line tip in the upper SVC. No pneumothorax. Interval removal of NG tube. Improving aeration in the right upper lobe. Cardiomegaly, vascular congestion.  Electronically Signed   By: Rolm Baptise M.D.   On: 04/04/2019 23:47   DG Chest Portable 1 View  Result Date: 04/01/2019 CLINICAL DATA:  Nasogastric tube placement. EXAM: PORTABLE CHEST 1 VIEW COMPARISON:  None. FINDINGS: An endotracheal tube is seen with its distal tip approximately 3.5 cm from the carina. A nasogastric tube is noted. Its distal end is limited in visualization secondary to image cut off and appears to sit within the distal aspect of the esophagus. There is marked severity opacifications of the right upper lobe which represents a new finding when compared to the prior exam. Mild atelectasis and/or infiltrate is seen within the bilateral lung bases. The costophrenic angles are limited in evaluation secondary to image con off. The cardiac silhouette is enlarged and unchanged in size. Degenerative changes are seen throughout the thoracic spine. IMPRESSION: 1. Endotracheal tube and nasogastric tube as described above. Further advancement of the NG  tube by approximately 8 cm is recommended to decrease the risk of aspiration. 2. Marked severity opacification of the right upper lobe which represents a new finding when compared to prior exam and is likely consistent with marked severity atelectasis and/or infiltrate. Electronically Signed   By: Virgina Norfolk M.D.   On: 03/28/2019 22:43   DG Chest Portable 1 View  Result Date: 04/06/2019 CLINICAL DATA:  Intubation EXAM: PORTABLE CHEST 1 VIEW COMPARISON:  12/31/2018 FINDINGS: NG tube coiled. This will likely in the esophagus need to be completely removed and replaced. Endotracheal tube tip is 4 cm above the carina. Cardiomegaly with vascular congestion. Patchy airspace disease in the right lung. No effusions or acute bony abnormality. IMPRESSION: NG tube coils in the esophagus and likely will need to be completely removed and replaced. Cardiomegaly, vascular congestion. Patchy right lung airspace disease concerning for pneumonia. Electronically Signed   By: Rolm Baptise M.D.   On: 04/03/2019 21:30   DG Abd Portable 1 View  Result Date: 03/30/2019 CLINICAL DATA:  Status post orogastric tube placement. EXAM: PORTABLE ABDOMEN - 1 VIEW COMPARISON:  April 24, 2010 FINDINGS: The bowel gas pattern is normal. A nasogastric tube is not identified. No radio-opaque calculi or other significant radiographic abnormality are seen. Radiopaque surgical coils are seen overlying the pelvis. IMPRESSION: A nasogastric tube is not identified. Electronically Signed   By: Virgina Norfolk M.D.   On: 04/05/2019 22:44    Procedures Procedure Name: Intubation Date/Time: 04/10/2019 1:20 AM Performed by: Nadeen Landau, MD Pre-anesthesia Checklist: Patient identified and Suction available Oxygen Delivery Method: Non-rebreather mask Preoxygenation: Pre-oxygenation with 100% oxygen Induction Type: Rapid sequence Ventilation: Mask ventilation without difficulty and Nasal airway inserted- appropriate to patient  size Laryngoscope Size: Glidescope and 4 Grade View: Grade I Nasal Tubes: Right Tube size: 7.5 mm Number of attempts: 1 Airway Equipment and Method: Video-laryngoscopy Placement Confirmation: ETT inserted through vocal cords under direct vision,  CO2 detector and Breath sounds checked- equal and bilateral Secured at: 23 cm Tube secured with: ETT holder Dental Injury: Teeth and Oropharynx as per pre-operative assessment  Difficulty Due To: Difficult Airway- due to large tongue    .Central Line  Date/Time: 04/10/2019 1:21 AM Performed by: Nadeen Landau, MD Authorized by: Brand Males, MD   Consent:    Consent obtained:  Emergent situation Pre-procedure details:    Hand hygiene: Hand hygiene performed prior to insertion     Sterile barrier technique: All elements of maximal sterile technique followed     Skin preparation:  ChloraPrep   Skin preparation  agent: Skin preparation agent completely dried prior to procedure   Sedation:    Sedation type: propofol for sedation. Anesthesia (see MAR for exact dosages):    Anesthesia method:  None Procedure details:    Location:  L internal jugular   Site selection rationale:  Best view of IJ on [place]   Patient position:  Trendelenburg   Procedural supplies:  Triple lumen   Landmarks identified: yes     Ultrasound guidance: yes     Sterile ultrasound techniques: Sterile gel and sterile probe covers were used     Number of attempts:  1   Successful placement: yes (Lumen in SVC)   Post-procedure details:    Post-procedure:  Dressing applied and line sutured   Assessment:  Blood return through all ports, no pneumothorax on x-ray, free fluid flow and placement verified by x-ray   Patient tolerance of procedure:  Tolerated well, no immediate complications   (including critical care time)  Medications Ordered in ED Medications  calcium gluconate 1 g in sodium chloride 0.9 % 100 mL IVPB (1 g Intravenous New Bag/Given 04/10/19 0040)   acetaminophen (TYLENOL) suppository 650 mg (has no administration in time range)  dextrose 10 % infusion (has no administration in time range)  sodium zirconium cyclosilicate (LOKELMA) packet 10 g (has no administration in time range)  heparin injection 5,000 Units (has no administration in time range)  pantoprazole sodium (PROTONIX) 40 mg/20 mL oral suspension 40 mg (has no administration in time range)  dextrose 5 %-0.45 % sodium chloride infusion (has no administration in time range)  ipratropium-albuterol (DUONEB) 0.5-2.5 (3) MG/3ML nebulizer solution 3 mL (has no administration in time range)  propofol (DIPRIVAN) 1000 MG/100ML infusion (40 mcg/kg/min  115.2 kg Intravenous New Bag/Given 04/10/19 0041)  midazolam (VERSED) injection 1 mg (has no administration in time range)  midazolam (VERSED) injection 1 mg (has no administration in time range)  fentaNYL (SUBLIMAZE) injection 25 mcg (has no administration in time range)  fentaNYL (SUBLIMAZE) injection 25-100 mcg (has no administration in time range)  insulin aspart (novoLOG) injection 1-3 Units (has no administration in time range)  vancomycin (VANCOREADY) IVPB 2000 mg/400 mL (has no administration in time range)  vancomycin (VANCOCIN) IVPB 1000 mg/200 mL premix (has no administration in time range)  ceFEPIme (MAXIPIME) 2 g in sodium chloride 0.9 % 100 mL IVPB (has no administration in time range)  hydrALAZINE (APRESOLINE) injection 10-40 mg (has no administration in time range)  etomidate (AMIDATE) injection (30 mg Intravenous Given 04/17/2019 2106)  rocuronium (ZEMURON) injection (100 mg Intravenous Given 04/16/2019 2106)  propofol (DIPRIVAN) 1000 MG/100ML infusion (  Stopped 04/10/19 0039)  fentaNYL (SUBLIMAZE) injection ( Intravenous Canceled Entry 04/18/2019 2130)  lactated ringers bolus 1,000 mL (1,000 mLs Intravenous New Bag/Given 04/10/19 0059)  insulin aspart (novoLOG) injection 5 Units (5 Units Intravenous Given 04/10/19 0059)    And   dextrose 50 % solution 50 mL (50 mLs Intravenous Given 04/10/19 0059)  midazolam (VERSED) 2 MG/2ML injection (2 mg  Given 03/27/2019 2256)  fentaNYL (SUBLIMAZE) 100 MCG/2ML injection (100 mcg  Given 03/30/2019 2256)    ED Course  I have reviewed the triage vital signs and the nursing notes.  Pertinent labs & imaging results that were available during my care of the patient were reviewed by me and considered in my medical decision making (see chart for details).    MDM Rules/Calculators/A&P  Patient is a 74 year old female with a PMH of ESRD (TThS), CAD, diabetes and hypertension presenting to the ED today due to altered mental status.  On arrival, she is being ventilated with BVM.  Her eyes are open spontaneously but she is not tracking, making any sounds or following commands.  She is noted to be moving her left upper extremity.  No movement in her lower extremities with painful stimuli.  Initial vitals are BP 204/110, HR 107, RR 25, SPO2 100% on BVM.  Temp 100.29F.  Intubated patient immediately for airway protection using RSI.  She was sedated with propofol after intubation and given boluses of fentanyl as she appeared restless as the G-tube was being placed.  Patient quickly to CT scanner due to high concern for ICH.  However, only injuries noted on CT head or C-spine is a right parietal scalp swelling.  Obtained further information from patient's family who says that they last spoke to her 4 days ago and that she sounded normal on the phone.  However, she noticed that she had not heard from patient over the past couple of days so her cousin went to check in on patient.  They found patient on the ground with altered mental status and called 911.  Patient was noted to be at dialysis this morning but unsure if she received her full session.  EKG with sinus tachycardia and signs of LVH.  EKG shows cardiomegaly and vascular congestion.  There is also "patchy right lung airspace disease  concerning for pneumonia."  CBC unremarkable.  CMP with potassium 6.6, CO2 19, glucose 186, BUN 81, creatinine 10.67, anion gap 21.  Initial troponin 856.  Covid negative.  Patient given calcium gluconate for cardioprotection in the setting of hyperkalemia.  Patient's mental status potentially related to respiratory failure but unsure of the exact etiology of the symptoms.  Consulted neurology due to concern for possible seizure, hypertensive encephalopathy or stroke.  Neurology team recommending MRI.  She will receive EEG in the ICU.  Triple-lumen placed in left IJ for central access.  Patient admitted to ICU.  For details on hospital course following admission, please refer to inpatient team's note.  Patient stable at time of admission.  Patient assessed and evaluated with Dr. Regenia Skeeter.  Nadeen Landau, MD   Final Clinical Impression(s) / ED Diagnoses Final diagnoses:  None    Rx / DC Orders ED Discharge Orders    None       Nadeen Landau, MD 04/10/19 [id number]    Nadeen Landau, MD 04/10/19 Francene Castle    Sherwood Gambler, MD 04/10/19 [phone]

## 2019-04-09 NOTE — Consult Note (Addendum)
Requesting Physician: Dr. Regenia Skeeter    Chief Complaint: AMS  History obtained from: Chart    HPI:                                                                                                                                       [name] is a 74 y.o. female with past medical history significant for end-stage renal disease on dialysis, type 2 diabetes, hypertension chronic heart failure coronary artery disease, obstructive sleep apnea, prosthetic right eye, severe back pain due to L4/L5 stenosis presents to the emergency department obtunded after being found by her daughter.  Last known normal was possibly around 12 PM after patient returned from dialysis.  Around 6 PM, her daughter went to check on her and noticed she was on the floor not responding.   Patient brought in by EMS, on arrival blood pressures greater than 998 systolic.  Patient had a GCS of 5, intubated by EDP.  Prior to intubation EP felt that patient was not moving right side as much as the left side.  CT head was obtained which showed no acute hemorrhage.  Neurology was consulted for further recommendations.  No seizure-like activity was witnessed.  Patient recently seen in [place] for hypoglycemia on April 05, 2019.  On assessment, patient intubated sedated on propofol.  Stat MRI recommended.    Past Medical History:  Diagnosis Date  . Abdominal abscess 12-17-10   abdominal abscesses x2 ? one at this time  . Anemia   . Arthritis    "all over" (08/23/2012)  . Blind right eye    fell and crushed socket and eye fell out, was replaced at [facility]  . CHF (congestive heart failure) ([place])   . Chronic lower back pain   . Coronary artery disease    normal coronaries by 08/25/12 cath  . Dyspnea   . Dysrhythmia    irregular, "skips beats"  . ESRD (end stage renal disease) on dialysis ([place])    "started 12/2011; [place]; TTS" (08/23/2012  . Exertional shortness of breath   . Eye drainage    "lots; since fall 07/2011"  (08/23/2012)  . Gout   . H/O hiatal hernia   . Headache(784.0)    "weekly sometimes; since I fell and hit my head in 07/2011 08/23/2012)  . Heart murmur   . History of blood transfusion    "lots before starting dialysis" (08/23/2012)  . Hypertension    sees Dr. Willey Blade  . Inhalation injury    "worked at [employer]; can't inhale polyurethane or paint, etc" (08/23/2012)  . Myocardial infarction ([place]) 1970's or 1980's  . Neuromuscular disorder ([place])    diabetic neuropathy  . OSA on CPAP   . Staph aureus infection November 2012   . Swelling of both ankles 12-17-10  . Type II diabetes mellitus ([place])    "over 10 years now" (08/23/2012)  Past Surgical History:  Procedure Laterality Date  . [place] VITRECTOMY WITH 20 GAUGE MVR PORT Left 05/07/2017   Procedure: 25 GAUGE PARS PLANA VITRECTOMY WITH ENDOLASER LEFT EYE;  Surgeon: Bernarda Caffey, MD;  Location: [facility];  Service: Ophthalmology;  Laterality: Left;  . APPENDECTOMY    . AV FISTULA PLACEMENT  09/30/2011   Procedure: ARTERIOVENOUS (AV) FISTULA CREATION;  Surgeon: Conrad Leonardville, MD;  Location: [facility];  Service: Vascular;  Laterality: Left;  BRACHIAL-CEPHALIC  . BACK SURGERY    . BASCILIC VEIN TRANSPOSITION Left 11/10/2011   Left arm   . [place] TRANSPOSITION  01/28/2012   Procedure: BASCILIC VEIN TRANSPOSITION;  Surgeon: Conrad Esmeralda, MD;  Location: [facility];  Service: Vascular;  Laterality: Left;  Second Stage   . BIOPSY  08/24/2017   Procedure: BIOPSY;  Surgeon: Wilford Corner, MD;  Location: [facility];  Service: Endoscopy;;  . CARDIAC CATHETERIZATION  1980's  . CATARACT EXTRACTION W/ INTRAOCULAR LENS IMPLANT Bilateral 1990's  . COLONOSCOPY WITH PROPOFOL N/A 08/24/2017   Procedure: COLONOSCOPY WITH PROPOFOL;  Surgeon: Wilford Corner, MD;  [facility];  Service: Endoscopy;  Laterality: N/A;  . CORONARY ANGIOGRAPHY N/A 04/07/2017   Procedure: CORONARY ANGIOGRAPHY (CATH LAB);  Surgeon: Dixie Dials, MD;   Location: [facility];  Service: Cardiovascular;  Laterality: N/A;  . DILATION AND CURETTAGE OF UTERUS  1960's  . ESOPHAGOGASTRODUODENOSCOPY (EGD) WITH PROPOFOL N/A 08/24/2017   Procedure: ESOPHAGOGASTRODUODENOSCOPY (EGD) WITH PROPOFOL;  Surgeon: Wilford Corner, MD;  [facility];  Service: Endoscopy;  Laterality: N/A;  . EYE SURGERY    . FISTULA SUPERFICIALIZATION Left 02/18/2017   Procedure: SUPERFICIALIZATION LEFT ARM ARTERIOVENOUS FISTULA;  Surgeon: Serafina Mitchell, MD;  Location: [facility];  Service: Vascular;  Laterality: Left;  . HERNIA REPAIR    . INSERTION OF DIALYSIS CATHETER  01/05/2012   Procedure: INSERTION OF DIALYSIS CATHETER;  Surgeon: Conrad La Bolt, MD;  Location: [facility];  Service: Vascular;  Laterality: N/A;  Right Internal Jugular Placement  . INTRAOCULAR PROSTHESES INSERTION Right 2013   "fell; knocked my eye outl" (08/23/2012)  . JOINT REPLACEMENT    . LAPAROTOMY  12/18/2010   Procedure: EXPLORATORY LAPAROTOMY;  Surgeon: Rolm Bookbinder, MD;  [facility];  Service: General;  Laterality: N/A;  Abdominal Seroma Evacuation  . LEFT HEART CATHETERIZATION WITH CORONARY ANGIOGRAM N/A 08/25/2012   Procedure: LEFT HEART CATHETERIZATION WITH CORONARY ANGIOGRAM;  Surgeon: Birdie Riddle, MD;  Location: [facility];  Service: Cardiovascular;  Laterality: N/A;  . LUMBAR DISC SURGERY  1970'-80's   X 3  . TOTAL KNEE ARTHROPLASTY Left 1980's  . VAGINAL HYSTERECTOMY  1970's  . VENTRAL HERNIA REPAIR  2012-2013   component separation, repair with biologic; "had 3 surgeries to fix it" (08/23/2012)  . WOUND DEBRIDEMENT  03/20/2011   Procedure: DEBRIDEMENT ABDOMINAL WOUND;  Surgeon: Rolm Bookbinder, MD;  Location: [facility];  Service: General;  Laterality: N/A;  debridement abdominal wall, placement of wound vac    Family History  Problem Relation Age of Onset  . Hypertension Mother   . Cancer Brother        spine  . Hypertension Father   . Cancer Sister        brain  .  Hypertension Maternal Grandmother   . Anesthesia problems Neg Hx    Social History:  reports that she quit smoking about 38 years ago. Her smoking use included cigarettes. She has a 10.00 pack-year smoking history.  She has never used smokeless tobacco. She reports that she does not drink alcohol or use drugs.  Allergies:  Allergies  Allergen Reactions  . Adhesive [Tape] Rash  . Lyrica [Pregabalin] Other (See Comments)    Hallucinations    Medications:                                                                                                                        I reviewed home medications   ROS:                                                                                                                                     Unable to review systems due to patient's mental status   Examination:                                                                                                      General: Appears well-developed and well-nourished.  Psych: Affect appropriate to situation Eyes: No scleral injection HENT: No OP obstrucion Head: Normocephalic.  Cardiovascular: Normal rate and regular rhythm.  Respiratory: Effort normal and breath sounds normal to anterior ascultation GI: Soft.  No distension. There is no tenderness.  Skin: WDI    Neurological Examination Mental Status: Patient does not respond to verbal stimuli.  Does not respond to deep sternal rub.  Does not follow commands.  No verbalizations noted.  Cranial Nerves: II: patient does not respond confrontation bilaterally, left pupil sluggish III,IV,VI: doll's response absent V,VII: corneal reflex: Absent VIII: patient does not respond to verbal stimuli IX,X: gag reflex: absent  XII: tongue strength unable to test Motor: Extremities flaccid throughout.  No spontaneous movement noted.  . Sensory: Does not respond to noxious stimuli in any extremity. Deep Tendon Reflexes:  Absent throughout. Plantars:  [place] Cerebellar: Unable to perform     Lab Results: Basic Metabolic Panel: Recent Labs  Lab 04/05/19 1250 04/17/2019 2105 03/31/2019 2108 04/06/2019 2109 04/08/2019 2241  NA 139 141 139 139  139  K 4.6 6.6* 6.2* 6.2* 5.2*  CL 93* 101  --  105  --   CO2 32 19*  --   --   --   GLUCOSE 112* 186*  --  179*  --   BUN 25* 81*  --  69*  --   CREATININE 6.22* 10.67*  --  11.50*  --   CALCIUM 8.7* 8.7*  --   --   --     CBC: Recent Labs  Lab 04/05/19 1250 04/16/2019 2105 04/08/2019 2108 03/30/2019 2109 03/27/2019 2241  WBC 5.9 10.4  --   --   --   NEUTROABS  --  8.0*  --   --   --   HGB 10.8* 14.7 15.6* 16.0* 15.0  HCT 35.2* 46.5* 46.0 47.0* 44.0  MCV 88.9 86.3  --   --   --   PLT 201 222  --   --   --     Coagulation Studies: No results for input(s): LABPROT, INR in the last 72 hours.  Imaging: CT Head Wo Contrast  Result Date: 04/13/2019 CLINICAL DATA:  Altered mental status. Patient is on a ventilator. EXAM: CT HEAD WITHOUT CONTRAST TECHNIQUE: Contiguous axial images were obtained from the base of the skull through the vertex without intravenous contrast. COMPARISON:  None. FINDINGS: Brain: There is mild cerebral atrophy with widening of the extra-axial spaces and ventricular dilatation. There are areas of decreased attenuation within the white matter tracts of the supratentorial brain, consistent with microvascular disease changes. Vascular: No hyperdense vessel or unexpected calcification. Skull: Normal. Negative for fracture or focal lesion. Sinuses/Orbits: There is mild to moderate severity bilateral ethmoid sinus mucosal thickening. A prosthetic right globe is noted. This is present on the prior study. Other: Mild to moderate severity right parietal scalp soft tissue swelling is seen. This represents a new finding when compared to the prior exam. IMPRESSION: 1. Mild to moderate severity right parietal scalp soft tissue swelling. This represents a new finding when compared to the prior  study dated March 30, 2019. 2. Findings of microvascular disease, and cerebral atrophy. 3. No acute intracranial process. 4. Ethmoid sinus mucosal thickening. Electronically Signed   By: Virgina Norfolk M.D.   On: 04/12/2019 21:48   CT Cervical Spine Wo Contrast  Result Date: 03/28/2019 CLINICAL DATA:  Status post trauma. EXAM: CT CERVICAL SPINE WITHOUT CONTRAST TECHNIQUE: Multidetector CT imaging of the cervical spine was performed without intravenous contrast. Multiplanar CT image reconstructions were also generated. COMPARISON:  None. FINDINGS: Alignment: Normal. Skull base and vertebrae: No acute fracture. No primary bone lesion or focal pathologic process. Soft tissues and spinal canal: No prevertebral fluid or swelling. No visible canal hematoma. Disc levels: Very mild multilevel endplate sclerosis is seen with mild anterior osteophyte formation noted at the level of C5-C6. Mild multilevel intervertebral disc space narrowing is seen. Upper chest: The left lobe of the thyroid gland is markedly enlarged and heterogeneous in appearance. Moderate severity calcification of the left lobe of the thyroid gland is also seen. Other: An endotracheal tube is in place. IMPRESSION: 1. No acute fracture or subluxation identified. 2. Mild multilevel degenerative disc disease. 3. Markedly enlarged and heterogeneous left lobe of the thyroid gland. Recommend further evaluation with a nonemergent thyroid ultrasound. Electronically Signed   By: Virgina Norfolk M.D.   On: 04/18/2019 21:55   DG Chest Portable 1 View  Result Date: 04/14/2019 CLINICAL DATA:  Nasogastric tube placement. EXAM: PORTABLE CHEST 1 VIEW COMPARISON:  None. FINDINGS: An endotracheal tube is seen with its distal tip approximately 3.5 cm from the carina. A nasogastric tube is noted. Its distal end is limited in visualization secondary to image cut off and appears to sit within the distal aspect of the esophagus. There is marked severity  opacifications of the right upper lobe which represents a new finding when compared to the prior exam. Mild atelectasis and/or infiltrate is seen within the bilateral lung bases. The costophrenic angles are limited in evaluation secondary to image con off. The cardiac silhouette is enlarged and unchanged in size. Degenerative changes are seen throughout the thoracic spine. IMPRESSION: 1. Endotracheal tube and nasogastric tube as described above. Further advancement of the NG tube by approximately 8 cm is recommended to decrease the risk of aspiration. 2. Marked severity opacification of the right upper lobe which represents a new finding when compared to prior exam and is likely consistent with marked severity atelectasis and/or infiltrate. Electronically Signed   By: Virgina Norfolk M.D.   On: 04/18/2019 22:43   DG Chest Portable 1 View  Result Date: 04/07/2019 CLINICAL DATA:  Intubation EXAM: PORTABLE CHEST 1 VIEW COMPARISON:  12/31/2018 FINDINGS: NG tube coiled. This will likely in the esophagus need to be completely removed and replaced. Endotracheal tube tip is 4 cm above the carina. Cardiomegaly with vascular congestion. Patchy airspace disease in the right lung. No effusions or acute bony abnormality. IMPRESSION: NG tube coils in the esophagus and likely will need to be completely removed and replaced. Cardiomegaly, vascular congestion. Patchy right lung airspace disease concerning for pneumonia. Electronically Signed   By: Rolm Baptise M.D.   On: 04/06/2019 21:30   DG Abd Portable 1 View  Result Date: 04/18/2019 CLINICAL DATA:  Status post orogastric tube placement. EXAM: PORTABLE ABDOMEN - 1 VIEW COMPARISON:  April 24, 2010 FINDINGS: The bowel gas pattern is normal. A nasogastric tube is not identified. No radio-opaque calculi or other significant radiographic abnormality are seen. Radiopaque surgical coils are seen overlying the pelvis. IMPRESSION: A nasogastric tube is not identified.  Electronically Signed   By: Virgina Norfolk M.D.   On: 04/13/2019 22:44     I have reviewed the above imaging : CT head   ASSESSMENT AND PLAN  74 y.o. female with past medical history significant for end-stage renal disease on dialysis, type 2 diabetes, hypertension chronic heart failure coronary artery disease, obstructive sleep apnea, prosthetic right eye presents to the emergency department obtunded after being found by her daughter.  Comatose on presentation Acute toxic metabolic encephalopathy Possible acute stroke/hypertensive emergency/posterior reversible encephalopathy syndrome/seizures   Recommendations Stat MRI brain: Negative for acute stroke/press BP goal to be determined after MRI  Wean sedation as tolerated EEG to evaluate for seizures Correct electrolyte and metabolic abnormalities   Addendum -Patient continues to withdraw less on the right side compared to the left after weaning off sedation as much as possible.  We will add Keppra prior milligrams twice daily for possible seizure given MRI brain shows area of encephalomalacia in the left hemisphere. - BP goal of 140 -160 SBP -Low clinical suspicion for status epilepticus, will obtain routine EEG in the morning  CRITICAL CARE Performed by: Lanice Schwab Burlie Cajamarca   Total critical care time: 45  minutes  Critical care time was exclusive of separately billable procedures and treating other patients.  Critical care was necessary to treat or prevent imminent or life-threatening deterioration.  Critical care was time spent personally by me on the following  activities: development of treatment plan with patient and/or surrogate as well as nursing, discussions with consultants, evaluation of patient's response to treatment, examination of patient, obtaining history from patient or surrogate, ordering and performing treatments and interventions, ordering and review of laboratory studies, ordering and review of radiographic  studies, pulse oximetry and re-evaluation of patient's condition.    Amanee Iacovelli [facility] Pager Number [phone]

## 2019-04-09 NOTE — H&P (Addendum)
NAME:  [name], MRN:  [id number], DOB:  1945/09/11, LOS: 0 ADMISSION DATE:  04/16/2019, CONSULTATION DATE:  04/11/2019  REFERRING MD: Dr. Ardith Dark of the ER, CHIEF COMPLAINT: Acute encephalopathy  Brief History   End-stage renal disease patient with unresponsiveness and subsequent intubation in the ER  History of present illness   74 year old morbidly obese female on Tuesday Thursday Saturday dialysis.  At baseline walks with a walker. Past medical history includes blindness in the right eye, chronic low back pain, coronary artery disease not otherwise specified, end-stage renal disease, OSA on CPAP, type 2 diabetes.  Multiple recent ER visits including March 02, 2019 for right knee pain, March 30, 2019 for dizziness with a negative MRI ("Old left occipital cortical and subcortical infarction. Chronic small-vessel ischemic changes throughout the brain elsewhere as outlined above.") and again on April 05, 2019 for hypoglycemia following dialysis.  Current history by the ER resident and the physician assistant and critical care: Was found unresponsive with unknown.  By family.  Brought in by EMS.  Intubated in the ED.  Son reports per nursing of the right side was weak.  Apparently has been seen by neurology MRI is pending.  According to the ER resident Dr. Apolonio Schneiders reported that she did notice that the patient was moving the right side.  At the time of this evaluation patient on propofol infusion with a RASS sedation score of -4 without any gag or cough with thin copious secretions through the ET tube.  Not responding to painful stimuli such as an IV stick.  Only 1 peripheral IV present.  And central line placed by the resident with supervision by critical care.  REsp acidosis on admit abg Rt Parietal scalp swelling on admit CT. MRIpending  Past Medical History     has a past medical history of Abdominal abscess (12-17-10), Anemia, Arthritis, Blind right eye, CHF (congestive heart  failure) ([place]), Chronic lower back pain, Coronary artery disease, Dyspnea, Dysrhythmia, ESRD (end stage renal disease) on dialysis [facility]), Exertional shortness of breath, Eye drainage, Gout, H/O hiatal hernia, Headache(784.0), Heart murmur, History of blood transfusion, Hypertension, Inhalation injury, Myocardial infarction ([place]) (1970's or 1980's), Neuromuscular disorder ([place]), OSA on CPAP, Staph aureus infection (November 2012 ), Swelling of both ankles (12-17-10), and Type II diabetes mellitus ([place]).   reports that she quit smoking about 38 years ago. Her smoking use included cigarettes. She has a 10.00 pack-year smoking history. She has never used smokeless tobacco.  Past Surgical History:  Procedure Laterality Date  . [place] VITRECTOMY WITH 20 GAUGE MVR PORT Left 05/07/2017   Procedure: 25 GAUGE PARS PLANA VITRECTOMY WITH ENDOLASER LEFT EYE;  Surgeon: Bernarda Caffey, MD;  Location: [facility];  Service: Ophthalmology;  Laterality: Left;  . APPENDECTOMY    . AV FISTULA PLACEMENT  09/30/2011   Procedure: ARTERIOVENOUS (AV) FISTULA CREATION;  Surgeon: Conrad Stanberry, MD;  Location: [facility];  Service: Vascular;  Laterality: Left;  BRACHIAL-CEPHALIC  . BACK SURGERY    . BASCILIC VEIN TRANSPOSITION Left 11/10/2011   Left arm   . [place] TRANSPOSITION  01/28/2012   Procedure: BASCILIC VEIN TRANSPOSITION;  Surgeon: Conrad [place], MD;  Location: [place];  Service: Vascular;  Laterality: Left;  Second Stage   . BIOPSY  08/24/2017   Procedure: BIOPSY;  Surgeon: Wilford Corner, MD;  [facility];  Service: Endoscopy;;  . CARDIAC CATHETERIZATION  1980's  . CATARACT EXTRACTION W/ INTRAOCULAR LENS IMPLANT Bilateral 1990's  .  COLONOSCOPY WITH PROPOFOL N/A 08/24/2017   Procedure: COLONOSCOPY WITH PROPOFOL;  Surgeon: Wilford Corner, MD;  [facility];  Service: Endoscopy;  Laterality: N/A;  . CORONARY ANGIOGRAPHY N/A 04/07/2017   Procedure: CORONARY ANGIOGRAPHY (CATH LAB);  Surgeon:  Dixie Dials, MD;  Location: [facility] CV LAB;  Service: Cardiovascular;  Laterality: N/A;  . DILATION AND CURETTAGE OF UTERUS  1960's  . ESOPHAGOGASTRODUODENOSCOPY (EGD) WITH PROPOFOL N/A 08/24/2017   Procedure: ESOPHAGOGASTRODUODENOSCOPY (EGD) WITH PROPOFOL;  Surgeon: Wilford Corner, MD;  [facility];  Service: Endoscopy;  Laterality: N/A;  . EYE SURGERY    . FISTULA SUPERFICIALIZATION Left 02/18/2017   Procedure: SUPERFICIALIZATION LEFT ARM ARTERIOVENOUS FISTULA;  Surgeon: Serafina Mitchell, MD;  Location: [facility];  Service: Vascular;  Laterality: Left;  . HERNIA REPAIR    . INSERTION OF DIALYSIS CATHETER  01/05/2012   Procedure: INSERTION OF DIALYSIS CATHETER;  Surgeon: Conrad Enhaut, MD;  Location: [facility];  Service: Vascular;  Laterality: N/A;  Right Internal Jugular Placement  . INTRAOCULAR PROSTHESES INSERTION Right 2013   "fell; knocked my eye outl" (08/23/2012)  . JOINT REPLACEMENT    . LAPAROTOMY  12/18/2010   Procedure: EXPLORATORY LAPAROTOMY;  Surgeon: Rolm Bookbinder, MD;  [facility];  Service: General;  Laterality: N/A;  Abdominal Seroma Evacuation  . LEFT HEART CATHETERIZATION WITH CORONARY ANGIOGRAM N/A 08/25/2012   Procedure: LEFT HEART CATHETERIZATION WITH CORONARY ANGIOGRAM;  Surgeon: Birdie Riddle, MD;  Location: [facility] C[facility];  Service: Cardiovascular;  Laterality: N/A;  . LUMBAR DISC SURGERY  1970'-80's   X 3  . TOTAL KNEE ARTHROPLASTY Left 1980's  . VAGINAL HYSTERECTOMY  1970's  . VENTRAL HERNIA REPAIR  2012-2013   component separation, repair with biologic; "had 3 surgeries to fix it" (08/23/2012)  . WOUND DEBRIDEMENT  03/20/2011   Procedure: DEBRIDEMENT ABDOMINAL WOUND;  Surgeon: Rolm Bookbinder, MD;  Location: [facility];  Service: General;  Laterality: N/A;  debridement abdominal wall, placement of wound vac    Allergies  Allergen Reactions  . Adhesive [Tape] Rash  . Lyrica [Pregabalin] Other (See Comments)    Hallucinations    Immunization History    Administered Date(s) Administered  . Fluad Quad(high Dose 65+) 10/07/2018  . Influenza, High Dose Seasonal PF 02/03/2018  . Influenza-Unspecified 10/20/2007, 11/30/2015  . Pneumococcal-Unspecified 09/21/2011  . Tdap 08/05/2011    Family History  Problem Relation Age of Onset  . Hypertension Mother   . Cancer Brother        spine  . Hypertension Father   . Cancer Sister        brain  . Hypertension Maternal Grandmother   . Anesthesia problems Neg Hx      Current Facility-Administered Medications:  .  propofol (DIPRIVAN) 1000 MG/100ML infusion, , , ,  .  acetaminophen (TYLENOL) suppository 650 mg, 650 mg, Rectal, Once, Sherwood Gambler, MD .  calcium gluconate 1 g in sodium chloride 0.9 % 100 mL IVPB, 1 g, Intravenous, Once, Nadeen Landau, MD .  ceFEPIme (MAXIPIME) 1 g in sodium chloride 0.9 % 100 mL IVPB, 1 g, Intravenous, Once, Nadeen Landau, MD .  dextrose 10 % infusion, , Intravenous, Once, Sherwood Gambler, MD .  insulin aspart (novoLOG) injection 5 Units, 5 Units, Intravenous, Once **AND** dextrose 50 % solution 50 mL, 1 ampule, Intravenous, Once, Sherwood Gambler, MD .  lactated ringers bolus 1,000 mL, 1,000 mL, Intravenous, Once, Nadeen Landau, MD .  vancomycin (VANCOCIN) IVPB 1000 mg/200 mL premix, 1,000 mg, Intravenous, Once,  Nadeen Landau, MD  Current Outpatient Medications:  .  acetaminophen (TYLENOL) 325 MG tablet, Take 2 tablets (650 mg total) by mouth every 6 (six) hours as needed for mild pain (or Fever >/= 101)., Disp: 20 tablet, Rfl: 0 .  albuterol (PROVENTIL) (2.5 MG/3ML) 0.083% nebulizer solution, Take 3 mLs (2.5 mg total) by nebulization every 4 (four) hours as needed for shortness of breath., Disp: 75 mL, Rfl: 0 .  allopurinol (ZYLOPRIM) 100 MG tablet, Take 0.5 tablets (50 mg total) by mouth daily. (Patient taking differently: Take 50 mg by mouth daily as needed (for gout flares). ), Disp: 15 tablet, Rfl: 3 .  ALPRAZolam (XANAX) 0.25 MG tablet, Take 0.25 mg by  mouth as needed for anxiety., Disp: , Rfl:  .  aspirin EC 81 MG tablet, Take 81 mg by mouth daily.  , Disp: , Rfl:  .  atorvastatin (LIPITOR) 40 MG tablet, Take 40 mg by mouth daily., Disp: , Rfl:  .  budesonide-formoterol (SYMBICORT) 160-4.5 MCG/ACT inhaler, Inhale 2 puffs into the lungs 2 (two) times daily., Disp: 1 Inhaler, Rfl: 3 .  cinacalcet (SENSIPAR) 60 MG tablet, Take 60 mg by mouth See admin instructions. Take 60 mg by mouth AFTER each dialysis treatment on Tues/Thurs/Sat, Disp: , Rfl:  .  cloNIDine (CATAPRES) 0.1 MG tablet, Take 0.1 mg by mouth in the morning, at noon, in the evening, and at bedtime. , Disp: , Rfl:  .  doxercalciferol (HECTOROL) 4 MCG/2ML injection, Inject 1.5 mLs (3 mcg total) into the vein Every Tuesday,Thursday,and Saturday with dialysis., Disp: 2 mL, Rfl:  .  HYDROcodone-acetaminophen (NORCO/VICODIN) 5-325 MG tablet, Take 1 tablet by mouth every 8 (eight) hours as needed. (Patient not taking: Reported on 03/30/2019), Disp: 6 tablet, Rfl: 0 .  meclizine (ANTIVERT) 50 MG tablet, Take 1 tablet (50 mg total) by mouth 3 (three) times daily as needed., Disp: 30 tablet, Rfl: 0 .  methocarbamol (ROBAXIN) 500 MG tablet, Take 500 mg by mouth 3 (three) times daily as needed for muscle spasms., Disp: , Rfl:  .  midodrine (PROAMATINE) 10 MG tablet, Take 10 mg by mouth See admin instructions. Take 10 mg by mouth "just prior to dialysis, then another 10 mg mid-treatment"- Tues/Thurs/Sat, Disp: , Rfl:  .  Multiple Vitamins-Minerals (ONE-A-DAY WOMENS PO), Take 1 tablet by mouth daily with breakfast., Disp: , Rfl:  .  oxyCODONE-acetaminophen (PERCOCET/ROXICET) 5-325 MG tablet, Take 1 tablet by mouth every 6 (six) hours as needed for moderate pain., Disp: 10 tablet, Rfl: 0 .  polyethylene glycol (MIRALAX / GLYCOLAX) 17 g packet, Take 17 g by mouth daily. (Patient taking differently: Take 17 g by mouth daily as needed for mild constipation. ), Disp: 14 each, Rfl: 0 .  sevelamer carbonate  (RENVELA) 800 MG tablet, Take 4 tablets (3,200 mg total) by mouth 3 (three) times daily with meals., Disp: 120 tablet, Rfl: 3 .  Tiotropium Bromide Monohydrate (SPIRIVA RESPIMAT) 2.5 MCG/ACT AERS, Inhale 2 puffs into the lungs daily. (Patient taking differently: Inhale 1 puff into the lungs daily. ), Disp: 1 Inhaler, Rfl: 0   Significant Hospital Events   03/02/2019: ER visit for arthritic type knee pain and history of fall with generalized weakness.  Right knee was swollen and warm.  Treated with as needed hydrocodone  03/30/2019: ER visit for generalized weakness, dizziness and vertigo following dialysis.Marland Kitchen  MRI negative.  04/05/2019: Hypoglycemia after dialysis.  Brought in confused and disoriented to the ER  03/22/2019: Unresponsive intubated admitted via the ER  Consults:  04/01/2019 - neuro and ccm  Procedures:  04/08/2019 0 ETT in ER 04/12/2019 - Left IJ in ER  Significant Diagnostic Tests:  x  Micro Data:  Blood  resp covid  Antimicrobials:  vanc 03/26/2019 Cefepime 03/27/2019      Interim history/subjective:  03/21/2019 -ccm eval in [place] ER. Family members initially outside ER  Objective   Blood pressure (!) 164/85, pulse (!) 102, temperature (!) 100.8 F (38.2 C), resp. rate 15, SpO2 96 %.    Vent Mode: PRVC FiO2 (%):  [100 %] 100 % Set Rate:  [16 bmp] 16 bmp Vt Set:  [500 mL] 500 mL PEEP:  [5 cmH20] 5 cmH20 Plateau Pressure:  [23 cmH20] 23 cmH20  No intake or output data in the 24 hours ending 03/24/2019 2329 There were no vitals filed for this visit.  Examination: General: morbidly obese female on vent in ER HENT: Pupils narrow, ? Reactive, conjuctiva muddy. ET tube + Lungs: Syn with ven.  Cardiovascular: tachy, hypertensive, sinus. Normal heart sounds Abdomen: obese, soft Extremities: intact Neuro: RAS -4 on diprivan. No gag GU: not examined  Resolved Hospital Problem list   ASSESSMENT / PLAN:  A:  ? Hx of copd Acute Respiratory Failure due to  acute encephalopathy - intubated in ER    P:   Full vent suport HOB > 30 deg VAP bundle BD    A:   Acute obtunded encephalopathy (recent er visits due to confusion feb and march 2021 - one of them due to hypoglycemia) . ? rigght sided weakness - if stroke out of window for TPA. Soft tissue scalp swelling on CT head  Per Dr Lorraine Lax - > 1am : suspect hypertensive encephalopathy   P:   Per NEURO  - getting MRI   A:   Hypertensive - SBP 220  P:  Goal sbp 140-160 Hydralaizine prn -> if fails -> start infusion   A: Known C-Diast CHF (normal cath 2019) ion echo may 2020 Mild MS and AS on echo May 2020  P: Monitor for pulm edema Get re[eat echo  CARDIAC ELECTRICAL A: NSR but at risk for arrythmia   P: EKG Tele   A:   COVID and flu negative. Possible HCAP/CAP - new RUL infiltrate on cxr 04/04/2019  P:   RX broadly empiric   A:  ESRD on TTS HD P:  Renal consult needs to be called   A:  Hyperkalemic P: lokelma Renal consult    A:   NPO  P:   TF ppi  HEMATOLOGIC   - HEME A:  At risk anemia critical illness   P:  - PRBC for hgb </= 6.9gm%    - exceptions are   -  if ACS susepcted/confirmed then transfuse for hgb </= 8.0gm%,  or    -  active bleeding with hemodynamic instability, then transfuse regardless of hemoglobin value   At at all times try to transfuse 1 unit prbc as possible with exception of active hemorrhage    A Normal plateelts  P Heparin dvt proph   A:   DM - hypoglyucemic ER visit in march 2021   P:   CBG q1h ssi  Enlarged Thyroid left lobe on admit CT 04/04/2019  Plan  = need to ensure safet to extubate  - might need TTNA biopsy while in hospital   At risk sacral decub  Plan  - monitor   Assessment & Plan:  Global - acute encephloapthy. Likely metabolic  Best practice:  Diet: npo. Needs TF Pain/Anxiety/Delirium protocol (if indicated): diprivan and fent  prn VAP protocol (if indicated): HOB >  30 DVT prophylaxis: heparin sq GI prophylaxis: ppi Glucose control: ssi Mobility: bed rest Code Status: full code Family Communication: daughter and cousing outside ER updated Disposition: ER to ICU      ATTESTATION & SIGNATURE   The patient KEYONNA COMUNALE is critically ill with multiple organ systems failure and requires high complexity decision making for assessment and support, frequent evaluation and titration of therapies, application of advanced monitoring technologies and extensive interpretation of multiple databases.   Critical Care Time devoted to patient care services described in this note is  75  Minutes. This time reflects time of care of this signee Dr Brand Males. This critical care time does not reflect procedure time, or teaching time or supervisory time of PA/NP/Med student/Med Resident etc but could involve care discussion time     Dr. Brand Males, M.D., [facility]C.P Pulmonary and Critical Care Medicine Staff Physician [facility] Pager: [phone], If no answer or between  15:00h - 7:00h: call [phone]  04/18/2019 11:29 PM     LABS    PULMONARY Recent Labs  Lab 04/01/2019 2108 04/08/2019 2109 04/18/2019 2241  PHART  --   --  7.292*  PCO2ART  --   --  55.9*  PO2ART  --   --  81.0*  HCO3 24.6  --  26.6  TCO2 26 27 28   O2SAT 96.0  --  93.0    CBC Recent Labs  Lab 04/05/19 1250 04/05/19 1250 03/31/2019 2105 04/08/2019 2105 04/15/2019 2108 04/12/2019 2109 04/14/2019 2241  HGB 10.8*   < > 14.7   < > 15.6* 16.0* 15.0  HCT 35.2*   < > 46.5*   < > 46.0 47.0* 44.0  WBC 5.9  --  10.4  --   --   --   --   PLT 201  --  222  --   --   --   --    < > = values in this interval not displayed.    COAGULATION No results for input(s): INR in the last 168 hours.  CARDIAC  No results for input(s): TROPONINI in the last 168 hours. No results for input(s): PROBNP in the last 168 hours.   CHEMISTRY Recent  Labs  Lab 04/05/19 1250 04/05/19 1250 04/08/2019 2105 04/04/2019 2105 04/07/2019 2108 04/07/2019 2108 03/26/2019 2109 04/18/2019 2241  NA 139  --  141  --  139  --  139 139  K 4.6   < > 6.6*   < > 6.2*   < > 6.2* 5.2*  CL 93*  --  101  --   --   --  105  --   CO2 32  --  19*  --   --   --   --   --   GLUCOSE 112*  --  186*  --   --   --  179*  --   BUN 25*  --  81*  --   --   --  69*  --   CREATININE 6.22*  --  10.67*  --   --   --  11.50*  --   CALCIUM 8.7*  --  8.7*  --   --   --   --   --    < > = values in  this interval not displayed.   Estimated Creatinine Clearance: 5.5 mL/min (A) (by C-G formula based on SCr of 11.5 mg/dL (H)).   LIVER Recent Labs  Lab 04/05/19 1250 04/10/2019 2105  AST 32 33  ALT 25 25  ALKPHOS 70 79  BILITOT 0.7 0.8  PROT 7.3 7.4  ALBUMIN 3.4* 3.0*     INFECTIOUS No results for input(s): LATICACIDVEN, PROCALCITON in the last 168 hours.   ENDOCRINE CBG (last 3)  Recent Labs    04/10/2019 2114  GLUCAP 143*         IMAGING x48h  - image(s) personally visualized  -   highlighted in bold CT Head Wo Contrast  Result Date: 03/28/2019 CLINICAL DATA:  Altered mental status. Patient is on a ventilator. EXAM: CT HEAD WITHOUT CONTRAST TECHNIQUE: Contiguous axial images were obtained from the base of the skull through the vertex without intravenous contrast. COMPARISON:  None. FINDINGS: Brain: There is mild cerebral atrophy with widening of the extra-axial spaces and ventricular dilatation. There are areas of decreased attenuation within the white matter tracts of the supratentorial brain, consistent with microvascular disease changes. Vascular: No hyperdense vessel or unexpected calcification. Skull: Normal. Negative for fracture or focal lesion. Sinuses/Orbits: There is mild to moderate severity bilateral ethmoid sinus mucosal thickening. A prosthetic right globe is noted. This is present on the prior study. Other: Mild to moderate severity right parietal scalp  soft tissue swelling is seen. This represents a new finding when compared to the prior exam. IMPRESSION: 1. Mild to moderate severity right parietal scalp soft tissue swelling. This represents a new finding when compared to the prior study dated March 30, 2019. 2. Findings of microvascular disease, and cerebral atrophy. 3. No acute intracranial process. 4. Ethmoid sinus mucosal thickening. Electronically Signed   By: Virgina Norfolk M.D.   On: 04/10/2019 21:48   CT Cervical Spine Wo Contrast  Result Date: 04/10/2019 CLINICAL DATA:  Status post trauma. EXAM: CT CERVICAL SPINE WITHOUT CONTRAST TECHNIQUE: Multidetector CT imaging of the cervical spine was performed without intravenous contrast. Multiplanar CT image reconstructions were also generated. COMPARISON:  None. FINDINGS: Alignment: Normal. Skull base and vertebrae: No acute fracture. No primary bone lesion or focal pathologic process. Soft tissues and spinal canal: No prevertebral fluid or swelling. No visible canal hematoma. Disc levels: Very mild multilevel endplate sclerosis is seen with mild anterior osteophyte formation noted at the level of C5-C6. Mild multilevel intervertebral disc space narrowing is seen. Upper chest: The left lobe of the thyroid gland is markedly enlarged and heterogeneous in appearance. Moderate severity calcification of the left lobe of the thyroid gland is also seen. Other: An endotracheal tube is in place. IMPRESSION: 1. No acute fracture or subluxation identified. 2. Mild multilevel degenerative disc disease. 3. Markedly enlarged and heterogeneous left lobe of the thyroid gland. Recommend further evaluation with a nonemergent thyroid ultrasound. Electronically Signed   By: Virgina Norfolk M.D.   On: 03/22/2019 21:55   DG Chest Portable 1 View  Result Date: 04/05/2019 CLINICAL DATA:  Nasogastric tube placement. EXAM: PORTABLE CHEST 1 VIEW COMPARISON:  None. FINDINGS: An endotracheal tube is seen with its distal tip  approximately 3.5 cm from the carina. A nasogastric tube is noted. Its distal end is limited in visualization secondary to image cut off and appears to sit within the distal aspect of the esophagus. There is marked severity opacifications of the right upper lobe which represents a new finding when compared to the prior exam. Mild atelectasis  and/or infiltrate is seen within the bilateral lung bases. The costophrenic angles are limited in evaluation secondary to image con off. The cardiac silhouette is enlarged and unchanged in size. Degenerative changes are seen throughout the thoracic spine. IMPRESSION: 1. Endotracheal tube and nasogastric tube as described above. Further advancement of the NG tube by approximately 8 cm is recommended to decrease the risk of aspiration. 2. Marked severity opacification of the right upper lobe which represents a new finding when compared to prior exam and is likely consistent with marked severity atelectasis and/or infiltrate. Electronically Signed   By: Virgina Norfolk M.D.   On: 04/16/2019 22:43   DG Chest Portable 1 View  Result Date: 04/01/2019 CLINICAL DATA:  Intubation EXAM: PORTABLE CHEST 1 VIEW COMPARISON:  12/31/2018 FINDINGS: NG tube coiled. This will likely in the esophagus need to be completely removed and replaced. Endotracheal tube tip is 4 cm above the carina. Cardiomegaly with vascular congestion. Patchy airspace disease in the right lung. No effusions or acute bony abnormality. IMPRESSION: NG tube coils in the esophagus and likely will need to be completely removed and replaced. Cardiomegaly, vascular congestion. Patchy right lung airspace disease concerning for pneumonia. Electronically Signed   By: Rolm Baptise M.D.   On: 04/17/2019 21:30   DG Abd Portable 1 View  Result Date: 04/08/2019 CLINICAL DATA:  Status post orogastric tube placement. EXAM: PORTABLE ABDOMEN - 1 VIEW COMPARISON:  April 24, 2010 FINDINGS: The bowel gas pattern is normal. A  nasogastric tube is not identified. No radio-opaque calculi or other significant radiographic abnormality are seen. Radiopaque surgical coils are seen overlying the pelvis. IMPRESSION: A nasogastric tube is not identified. Electronically Signed   By: Virgina Norfolk M.D.   On: 04/04/2019 22:44

## 2019-04-09 NOTE — Progress Notes (Signed)
Chaplain met family briefly in consult room B; cousin Joseph Art and daughter [name] are present.  Provided hospitality and emotional support.   Please contact as needed for f/u care.   [place], [place]     03/31/2019 2100  Clinical Encounter Type  Visited With Family  Visit Type Initial  Referral From Care management  Consult/Referral To Chaplain  Stress Factors  Family Stress Factors Lack of knowledge

## 2019-04-10 ENCOUNTER — Inpatient Hospital Stay (HOSPITAL_COMMUNITY): Payer: Medicare Other

## 2019-04-10 DIAGNOSIS — G931 Anoxic brain damage, not elsewhere classified: Secondary | ICD-10-CM | POA: Diagnosis present

## 2019-04-10 DIAGNOSIS — I361 Nonrheumatic tricuspid (valve) insufficiency: Secondary | ICD-10-CM | POA: Diagnosis not present

## 2019-04-10 DIAGNOSIS — J9601 Acute respiratory failure with hypoxia: Secondary | ICD-10-CM | POA: Diagnosis present

## 2019-04-10 DIAGNOSIS — Z20822 Contact with and (suspected) exposure to covid-19: Secondary | ICD-10-CM | POA: Diagnosis present

## 2019-04-10 DIAGNOSIS — I4892 Unspecified atrial flutter: Secondary | ICD-10-CM | POA: Diagnosis not present

## 2019-04-10 DIAGNOSIS — I352 Nonrheumatic aortic (valve) stenosis with insufficiency: Secondary | ICD-10-CM | POA: Diagnosis not present

## 2019-04-10 DIAGNOSIS — N186 End stage renal disease: Secondary | ICD-10-CM | POA: Diagnosis present

## 2019-04-10 DIAGNOSIS — Z992 Dependence on renal dialysis: Secondary | ICD-10-CM | POA: Diagnosis not present

## 2019-04-10 DIAGNOSIS — G92 Toxic encephalopathy: Secondary | ICD-10-CM | POA: Diagnosis present

## 2019-04-10 DIAGNOSIS — N2581 Secondary hyperparathyroidism of renal origin: Secondary | ICD-10-CM | POA: Diagnosis present

## 2019-04-10 DIAGNOSIS — G9341 Metabolic encephalopathy: Secondary | ICD-10-CM | POA: Diagnosis not present

## 2019-04-10 DIAGNOSIS — Z7189 Other specified counseling: Secondary | ICD-10-CM | POA: Diagnosis not present

## 2019-04-10 DIAGNOSIS — B9561 Methicillin susceptible Staphylococcus aureus infection as the cause of diseases classified elsewhere: Secondary | ICD-10-CM | POA: Diagnosis present

## 2019-04-10 DIAGNOSIS — J690 Pneumonitis due to inhalation of food and vomit: Secondary | ICD-10-CM | POA: Diagnosis present

## 2019-04-10 DIAGNOSIS — I4891 Unspecified atrial fibrillation: Secondary | ICD-10-CM | POA: Diagnosis not present

## 2019-04-10 DIAGNOSIS — I5032 Chronic diastolic (congestive) heart failure: Secondary | ICD-10-CM | POA: Diagnosis present

## 2019-04-10 DIAGNOSIS — R4182 Altered mental status, unspecified: Secondary | ICD-10-CM | POA: Diagnosis not present

## 2019-04-10 DIAGNOSIS — Z515 Encounter for palliative care: Secondary | ICD-10-CM | POA: Diagnosis not present

## 2019-04-10 DIAGNOSIS — G253 Myoclonus: Secondary | ICD-10-CM | POA: Diagnosis present

## 2019-04-10 DIAGNOSIS — R251 Tremor, unspecified: Secondary | ICD-10-CM | POA: Diagnosis not present

## 2019-04-10 DIAGNOSIS — D631 Anemia in chronic kidney disease: Secondary | ICD-10-CM | POA: Diagnosis present

## 2019-04-10 DIAGNOSIS — Z6841 Body Mass Index (BMI) 40.0 and over, adult: Secondary | ICD-10-CM | POA: Diagnosis not present

## 2019-04-10 DIAGNOSIS — Z9911 Dependence on respirator [ventilator] status: Secondary | ICD-10-CM | POA: Diagnosis not present

## 2019-04-10 DIAGNOSIS — E1140 Type 2 diabetes mellitus with diabetic neuropathy, unspecified: Secondary | ICD-10-CM | POA: Diagnosis present

## 2019-04-10 DIAGNOSIS — I132 Hypertensive heart and chronic kidney disease with heart failure and with stage 5 chronic kidney disease, or end stage renal disease: Secondary | ICD-10-CM | POA: Diagnosis present

## 2019-04-10 DIAGNOSIS — E872 Acidosis: Secondary | ICD-10-CM | POA: Diagnosis present

## 2019-04-10 DIAGNOSIS — Z66 Do not resuscitate: Secondary | ICD-10-CM | POA: Diagnosis not present

## 2019-04-10 LAB — CBC
HCT: 39.6 % (ref 36.0–46.0)
HCT: 37.2 % (ref 36.0–46.0)
Hemoglobin: 12.6 g/dL (ref 12.0–15.0)
Hemoglobin: 11.9 g/dL — ABNORMAL LOW (ref 12.0–15.0)
MCH: 27.4 pg (ref 26.0–34.0)
MCH: 27.5 pg (ref 26.0–34.0)
MCHC: 31.8 g/dL (ref 30.0–36.0)
MCHC: 32 g/dL (ref 30.0–36.0)
MCV: 86.1 fL (ref 80.0–100.0)
MCV: 85.9 fL (ref 80.0–100.0)
Platelets: 168 10*3/uL (ref 150–400)
Platelets: 181 10*3/uL (ref 150–400)
RBC: 4.6 MIL/uL (ref 3.87–5.11)
RBC: 4.33 MIL/uL (ref 3.87–5.11)
RDW: 19.8 % — ABNORMAL HIGH (ref 11.5–15.5)
RDW: 19.2 % — ABNORMAL HIGH (ref 11.5–15.5)
WBC: 13 10*3/uL — ABNORMAL HIGH (ref 4.0–10.5)
WBC: 14.2 10*3/uL — ABNORMAL HIGH (ref 4.0–10.5)
nRBC: 0 % (ref 0.0–0.2)
nRBC: 0 % (ref 0.0–0.2)

## 2019-04-10 LAB — POCT I-STAT 7, (LYTES, BLD GAS, ICA,H+H)
Acid-Base Excess: 1 mmol/L (ref 0.0–2.0)
Bicarbonate: 26 mmol/L (ref 20.0–28.0)
Calcium, Ion: 1.15 mmol/L (ref 1.15–1.40)
HCT: 37 % (ref 36.0–46.0)
Hemoglobin: 12.6 g/dL (ref 12.0–15.0)
O2 Saturation: 99 %
Patient temperature: 101.7
Potassium: 5.6 mmol/L — ABNORMAL HIGH (ref 3.5–5.1)
Sodium: 139 mmol/L (ref 135–145)
TCO2: 27 mmol/L (ref 22–32)
pCO2 arterial: 44.7 mmHg (ref 32.0–48.0)
pH, Arterial: 7.38 (ref 7.350–7.450)
pO2, Arterial: 153 mmHg — ABNORMAL HIGH (ref 83.0–108.0)

## 2019-04-10 LAB — BASIC METABOLIC PANEL
Anion gap: 17 — ABNORMAL HIGH (ref 5–15)
BUN: 86 mg/dL — ABNORMAL HIGH (ref 8–23)
CO2: 22 mmol/L (ref 22–32)
Calcium: 9 mg/dL (ref 8.9–10.3)
Chloride: 103 mmol/L (ref 98–111)
Creatinine, Ser: 10.92 mg/dL — ABNORMAL HIGH (ref 0.44–1.00)
GFR calc Af Amer: 4 mL/min — ABNORMAL LOW (ref >60)
GFR calc non Af Amer: 3 mL/min — ABNORMAL LOW (ref >60)
Glucose, Bld: 106 mg/dL — ABNORMAL HIGH (ref 70–99)
Potassium: 5.5 mmol/L — ABNORMAL HIGH (ref 3.5–5.1)
Sodium: 142 mmol/L (ref 135–145)

## 2019-04-10 LAB — PHOSPHORUS
Phosphorus: 6.6 mg/dL — ABNORMAL HIGH (ref 2.5–4.6)
Phosphorus: 6.8 mg/dL — ABNORMAL HIGH (ref 2.5–4.6)

## 2019-04-10 LAB — GLUCOSE, CAPILLARY
Glucose-Capillary: 69 mg/dL — ABNORMAL LOW (ref 70–99)
Glucose-Capillary: 69 mg/dL — ABNORMAL LOW (ref 70–99)
Glucose-Capillary: 81 mg/dL (ref 70–99)
Glucose-Capillary: 87 mg/dL (ref 70–99)
Glucose-Capillary: 91 mg/dL (ref 70–99)
Glucose-Capillary: 93 mg/dL (ref 70–99)
Glucose-Capillary: 95 mg/dL (ref 70–99)

## 2019-04-10 LAB — PROTIME-INR
INR: 1.2 (ref 0.8–1.2)
Prothrombin Time: 14.6 seconds (ref 11.4–15.2)

## 2019-04-10 LAB — RENAL FUNCTION PANEL
Albumin: 2.7 g/dL — ABNORMAL LOW (ref 3.5–5.0)
Anion gap: 20 — ABNORMAL HIGH (ref 5–15)
BUN: 93 mg/dL — ABNORMAL HIGH (ref 8–23)
CO2: 22 mmol/L (ref 22–32)
Calcium: 9.1 mg/dL (ref 8.9–10.3)
Chloride: 100 mmol/L (ref 98–111)
Creatinine, Ser: 11.73 mg/dL — ABNORMAL HIGH (ref 0.44–1.00)
GFR calc Af Amer: 3 mL/min — ABNORMAL LOW (ref >60)
GFR calc non Af Amer: 3 mL/min — ABNORMAL LOW (ref >60)
Glucose, Bld: 96 mg/dL (ref 70–99)
Phosphorus: 7.1 mg/dL — ABNORMAL HIGH (ref 2.5–4.6)
Potassium: 6.1 mmol/L — ABNORMAL HIGH (ref 3.5–5.1)
Sodium: 142 mmol/L (ref 135–145)

## 2019-04-10 LAB — TROPONIN I (HIGH SENSITIVITY)
Troponin I (High Sensitivity): 1035 ng/L (ref <18)
Troponin I (High Sensitivity): 571 ng/L (ref <18)

## 2019-04-10 LAB — MAGNESIUM
Magnesium: 2.3 mg/dL (ref 1.7–2.4)
Magnesium: 2.4 mg/dL (ref 1.7–2.4)

## 2019-04-10 LAB — ETHANOL: Alcohol, Ethyl (B): <10 mg/dL (ref <10)

## 2019-04-10 LAB — TRIGLYCERIDES: Triglycerides: 136 mg/dL (ref <150)

## 2019-04-10 LAB — TSH: TSH: 1.958 u[IU]/mL (ref 0.350–4.500)

## 2019-04-10 LAB — AMMONIA: Ammonia: 35 umol/L (ref 9–35)

## 2019-04-10 LAB — MRSA PCR SCREENING: MRSA by PCR: NEGATIVE

## 2019-04-10 LAB — LACTIC ACID, PLASMA: Lactic Acid, Venous: 1.1 mmol/L (ref 0.5–1.9)

## 2019-04-10 MED ORDER — CHLORHEXIDINE GLUCONATE CLOTH 2 % EX PADS: 6.0000 | MEDICATED_PAD | Freq: Every day | CUTANEOUS | Status: DC

## 2019-04-10 MED ORDER — CHLORHEXIDINE GLUCONATE 0.12% ORAL RINSE (MEDLINE KIT)
15.0000 mL | Freq: Two times a day (BID) | OROMUCOSAL | Status: DC
  Administered 2019-04-10 – 2019-04-17 (×16): 15 mL via OROMUCOSAL

## 2019-04-10 MED ORDER — FENTANYL CITRATE (PF) 100 MCG/2ML IJ SOLN: 25.0000 ug | INTRAMUSCULAR | Status: DC | PRN

## 2019-04-10 MED ORDER — HYDRALAZINE HCL 20 MG/ML IJ SOLN
10.0000 mg | INTRAMUSCULAR | Status: DC | PRN
  Administered 2019-04-11: 09:00:00 40 mg via INTRAVENOUS
  Administered 2019-04-13 – 2019-04-14 (×3): 20 mg via INTRAVENOUS
  Filled 2019-04-10 (×2): qty 1
  Filled 2019-04-10: qty 2
  Filled 2019-04-10 (×2): qty 1

## 2019-04-10 MED ORDER — VANCOMYCIN HCL IN DEXTROSE 1-5 GM/200ML-% IV SOLN: 1000.0000 mg | INTRAVENOUS | Status: DC

## 2019-04-10 MED ORDER — SODIUM CHLORIDE 0.9% FLUSH
10.0000 mL | Freq: Two times a day (BID) | INTRAVENOUS | Status: DC
  Administered 2019-04-10 – 2019-04-17 (×15): 10 mL

## 2019-04-10 MED ORDER — MIDAZOLAM HCL 2 MG/2ML IJ SOLN
1.0000 mg | INTRAMUSCULAR | Status: DC | PRN
  Administered 2019-04-10 – 2019-04-16 (×4): 1 mg via INTRAVENOUS
  Filled 2019-04-10 (×4): qty 2

## 2019-04-10 MED ORDER — SODIUM CHLORIDE 0.9 % IV SOLN: 2.0000 g | INTRAVENOUS | Status: DC

## 2019-04-10 MED ORDER — PANTOPRAZOLE SODIUM 40 MG PO PACK
40.0000 mg | PACK | Freq: Every day | ORAL | Status: DC
  Administered 2019-04-11 – 2019-04-17 (×7): 40 mg
  Filled 2019-04-10 (×8): qty 20

## 2019-04-10 MED ORDER — DEXTROSE 10 % IV SOLN: INTRAVENOUS | Status: DC

## 2019-04-10 MED ORDER — NICARDIPINE HCL IN NACL 20-0.86 MG/200ML-% IV SOLN
0.0000 mg/h | INTRAVENOUS | Status: DC
  Administered 2019-04-10: 5 mg/h via INTRAVENOUS
  Filled 2019-04-10: qty 200

## 2019-04-10 MED ORDER — SODIUM CHLORIDE 0.9 % IV SOLN: 100.0000 mL | INTRAVENOUS | Status: DC | PRN

## 2019-04-10 MED ORDER — DEXTROSE-NACL 5-0.45 % IV SOLN: INTRAVENOUS | Status: DC

## 2019-04-10 MED ORDER — ALTEPLASE 2 MG IJ SOLR: 2.0000 mg | Freq: Once | INTRAMUSCULAR | Status: DC | PRN

## 2019-04-10 MED ORDER — LEVETIRACETAM IN NACL 500 MG/100ML IV SOLN
500.0000 mg | Freq: Two times a day (BID) | INTRAVENOUS | Status: DC
  Administered 2019-04-10 – 2019-04-17 (×16): 500 mg via INTRAVENOUS
  Filled 2019-04-10 (×16): qty 100

## 2019-04-10 MED ORDER — ORAL CARE MOUTH RINSE
15.0000 mL | OROMUCOSAL | Status: DC
  Administered 2019-04-10 – 2019-04-18 (×79): 15 mL via OROMUCOSAL

## 2019-04-10 MED ORDER — SODIUM CHLORIDE 0.9 % IV SOLN
1.0000 g | INTRAVENOUS | Status: DC
  Administered 2019-04-11: 1 g via INTRAVENOUS
  Filled 2019-04-10 (×2): qty 1

## 2019-04-10 MED ORDER — LIDOCAINE-PRILOCAINE 2.5-2.5 % EX CREA: 1.0000 "application " | TOPICAL_CREAM | CUTANEOUS | Status: DC | PRN

## 2019-04-10 MED ORDER — PENTAFLUOROPROP-TETRAFLUOROETH EX AERO: 1.0000 "application " | INHALATION_SPRAY | CUTANEOUS | Status: DC | PRN

## 2019-04-10 MED ORDER — DEXTROSE 50 % IV SOLN
INTRAVENOUS | Status: AC
  Filled 2019-04-10: qty 50

## 2019-04-10 MED ORDER — VANCOMYCIN HCL 2000 MG/400ML IV SOLN
2000.0000 mg | Freq: Once | INTRAVENOUS | Status: AC
  Administered 2019-04-10: 2000 mg via INTRAVENOUS
  Filled 2019-04-10: qty 400

## 2019-04-10 MED ORDER — DEXTROSE 50 % IV SOLN
12.5000 g | INTRAVENOUS | Status: AC
  Administered 2019-04-10: 12.5 g via INTRAVENOUS

## 2019-04-10 MED ORDER — FENTANYL 2500MCG IN NS 250ML (10MCG/ML) PREMIX INFUSION
0.0000 ug/h | INTRAVENOUS | Status: DC
  Administered 2019-04-10: 25 ug/h via INTRAVENOUS
  Administered 2019-04-12: 100 ug/h via INTRAVENOUS
  Filled 2019-04-10 (×3): qty 250

## 2019-04-10 MED ORDER — CHLORHEXIDINE GLUCONATE CLOTH 2 % EX PADS
6.0000 | MEDICATED_PAD | Freq: Every day | CUTANEOUS | Status: DC
  Administered 2019-04-11 – 2019-04-17 (×4): 6 via TOPICAL

## 2019-04-10 MED ORDER — MIDAZOLAM HCL 2 MG/2ML IJ SOLN: 1.0000 mg | INTRAMUSCULAR | Status: DC | PRN

## 2019-04-10 MED ORDER — IPRATROPIUM-ALBUTEROL 0.5-2.5 (3) MG/3ML IN SOLN
3.0000 mL | Freq: Four times a day (QID) | RESPIRATORY_TRACT | Status: DC
  Administered 2019-04-10 – 2019-04-17 (×28): 3 mL via RESPIRATORY_TRACT
  Filled 2019-04-10 (×28): qty 3

## 2019-04-10 MED ORDER — PRO-STAT SUGAR FREE PO LIQD
30.0000 mL | Freq: Two times a day (BID) | ORAL | Status: DC
  Administered 2019-04-10 – 2019-04-11 (×2): 30 mL
  Filled 2019-04-10 (×2): qty 30

## 2019-04-10 MED ORDER — INSULIN ASPART 100 UNIT/ML ~~LOC~~ SOLN
1.0000 [IU] | SUBCUTANEOUS | Status: DC
  Administered 2019-04-11: 1 [IU] via SUBCUTANEOUS

## 2019-04-10 MED ORDER — LIDOCAINE HCL (PF) 1 % IJ SOLN: 5.0000 mL | INTRAMUSCULAR | Status: DC | PRN

## 2019-04-10 MED ORDER — PROPOFOL 1000 MG/100ML IV EMUL
0.0000 ug/kg/min | INTRAVENOUS | Status: DC
  Administered 2019-04-10: 40 ug/kg/min via INTRAVENOUS
  Administered 2019-04-10: 35 ug/kg/min via INTRAVENOUS
  Administered 2019-04-10 – 2019-04-11 (×3): 40 ug/kg/min via INTRAVENOUS
  Administered 2019-04-11: 35 ug/kg/min via INTRAVENOUS
  Administered 2019-04-11 (×6): 40 ug/kg/min via INTRAVENOUS
  Administered 2019-04-12: 20 ug/kg/min via INTRAVENOUS
  Administered 2019-04-12 (×2): 25 ug/kg/min via INTRAVENOUS
  Administered 2019-04-12: 30 ug/kg/min via INTRAVENOUS
  Administered 2019-04-13: 20 ug/kg/min via INTRAVENOUS
  Administered 2019-04-13: 25 ug/kg/min via INTRAVENOUS
  Administered 2019-04-13 – 2019-04-15 (×5): 20 ug/kg/min via INTRAVENOUS
  Administered 2019-04-16: 10 ug/kg/min via INTRAVENOUS
  Administered 2019-04-17: 20 ug/kg/min via INTRAVENOUS
  Filled 2019-04-10 (×27): qty 100

## 2019-04-10 MED ORDER — HEPARIN SODIUM (PORCINE) 5000 UNIT/ML IJ SOLN
5000.0000 [IU] | Freq: Three times a day (TID) | INTRAMUSCULAR | Status: DC
  Administered 2019-04-10 – 2019-04-17 (×23): 5000 [IU] via SUBCUTANEOUS
  Filled 2019-04-10 (×23): qty 1

## 2019-04-10 MED ORDER — DEXTROSE 50 % IV SOLN
INTRAVENOUS | Status: AC
  Administered 2019-04-10: 25 mL via INTRAVENOUS
  Filled 2019-04-10: qty 50

## 2019-04-10 MED ORDER — SODIUM CHLORIDE 0.9 % IV SOLN
2.0000 g | Freq: Once | INTRAVENOUS | Status: AC
  Administered 2019-04-10: 2 g via INTRAVENOUS
  Filled 2019-04-10: qty 2

## 2019-04-10 MED ORDER — HEPARIN SODIUM (PORCINE) 1000 UNIT/ML DIALYSIS: 1000.0000 [IU] | INTRAMUSCULAR | Status: DC | PRN

## 2019-04-10 MED ORDER — SODIUM CHLORIDE 0.9% FLUSH: 10.0000 mL | INTRAVENOUS | Status: DC | PRN

## 2019-04-10 MED ORDER — VITAL HIGH PROTEIN PO LIQD
1000.0000 mL | ORAL | Status: DC
  Administered 2019-04-10: 1000 mL

## 2019-04-10 MED ORDER — VITAL HIGH PROTEIN PO LIQD: 1000.0000 mL | ORAL | Status: DC

## 2019-04-10 NOTE — Progress Notes (Signed)
Spoke with MD about attempting Art line again and explained per protocol we could not attempt again because we have already had two RT's attempt in each arm twice and had to do so with doppler due to no palpable pulse. Stated understanding and asked to document. Therefore writing another note at this time clarifying pervious notes Art line attempt. RN aware at this time. Unsure if ground team will be called to place art line or not.

## 2019-04-10 NOTE — Progress Notes (Signed)
Assisted tele visit to patient with family member.  Parker Wherley P, RN  

## 2019-04-10 NOTE — Progress Notes (Signed)
Patient given 25g of dextrose for blood sugar of 69

## 2019-04-10 NOTE — Progress Notes (Signed)
Aline attempted by Pilar Plate, RRT x2 and myself x1. Blood flow returned x2 but unable to thread. RN aware and is calling MD.

## 2019-04-10 NOTE — Progress Notes (Signed)
eLink Physician-Brief Progress Note Patient Name: [name] DOB: 11-28-1945 MRN: [id number]   Date of Service  04/10/2019  HPI/Events of Note  Recurrent hypoglycemia. Patient is intubated, not on tube feeds (has no enteral access yet). RN asking about D10 drip to prevent hypoglycemia.   eICU Interventions  Start D10 at 35cc/hr to prevent hypoglycemia.  Insert OG tube. Start trickle feeds. Wean D10 rate as able.     Intervention Category Minor Interventions: Electrolytes abnormality - evaluation and management  Charlott Rakes 04/10/2019, 9:41 PM

## 2019-04-10 NOTE — Progress Notes (Signed)
eLink Physician-Brief Progress Note Patient Name: [name] DOB: 08-Nov-1945 MRN: [id number]   Date of Service  04/10/2019  HPI/Events of Note  Pt admitted with altered mental status and uncontrolled hypertension, etiology of AMS under investigation. MRI brain is without acute finding.  eICU Interventions  Cardene infusion, Propofol + Fentanyl for sedation, arterial line for close BP monitoring, work up to exclude metabolic encephalopathy, EEG to exclude seizures as etiology of encephalopathy, Neurology is seeing patient. New Patient Evaluation completed.        Kerry Kass Donye Dauenhauer 04/10/2019, 2:15 AM

## 2019-04-10 NOTE — Procedures (Signed)
Arterial Catheter Insertion Procedure Note [facility] [id number] [birth year]  Procedure: Insertion of Arterial Catheter  Indications: Blood pressure monitoring and Frequent blood sampling  Procedure Details Consent: Unable to obtain consent because of emergent medical necessity.   Time Out: Verified patient identification, verified procedure, site/side was marked, verified correct patient position, special equipment/implants available, medications/allergies/relevent history reviewed, required imaging and test results available.  Performed  Maximum sterile technique was used including antiseptics, cap, gloves, gown, hand hygiene, mask and sheet.  Skin prep: Chlorhexidine; local anesthetic administered 20 gauge catheter was inserted into right femoral artery using the Seldinger technique. ULTRASOUND GUIDANCE USED: YES Evaluation Blood flow good; BP tracing good. Complications: No apparent complications.    Noe Gens, MSN, NP-C [facility] 04/10/2019, 9:49 AM   Please see [email or web address] for pager details.

## 2019-04-10 NOTE — Procedures (Signed)
ELECTROENCEPHALOGRAM REPORT   Patient: [name]       Room #: 4N31C EEG No. ID: [id number] Age: 73 y.o.        Sex: female Requesting Physician: Ramaswamy Report Date:  04/10/2019        Interpreting Physician: Alexis Goodell  History: [name] is an 74 y.o. female with altered mental status  Medications:  Keppra, Fentanyl, Propofol, Cefepime, Insulin  Conditions of Recording:  This is a 21 channel routine scalp EEG performed with bipolar and monopolar montages arranged in accordance to the international 10/20 system of electrode placement. One channel was dedicated to EKG recording.  The patient is in the intubated and sedated state.  Description:  The background activity is slow and poorly organized.  It consists of a low voltage, polymorphic delta activity that is continuous and diffusely distributed.   No epileptiform activity is noted.   The patient is stimulated during the recording with activation of the background activity noted. Hyperventilation and intermittent photic stimulation were not performed.   IMPRESSION: This is an abnormal EEG secondary to general background slowing.  This finding may be seen with a diffuse disturbance that is etiologically nonspecific, but may include a metabolic encephalopathy or medication effect, among other possibilities.  No epileptiform activity is noted.     Alexis Goodell, MD Neurology [phone] 04/10/2019, 10:25 AM

## 2019-04-10 NOTE — Progress Notes (Signed)
Pharmacy Antibiotic Note  [name] is a 74 y.o. female admitted on 04/11/2019 with pneumonia.  Pharmacy has been consulted for vancomycin and cefepime dosing.  Plan: Vancomycin 2000mg  x1 then 1000mg  IV every HD.  Goal pre-HD level 15-25 mcg/mL. Cefepime 2g IV now and TTS.   Temp (24hrs), Avg:100.7 F (38.2 C), Min:100.4 F (38 C), Max:100.8 F (38.2 C)  Recent Labs  Lab 04/05/19 1250 04/03/2019 2105 04/17/2019 2109  WBC 5.9 10.4  --   CREATININE 6.22* 10.67* 11.50*    Estimated Creatinine Clearance: 5.5 mL/min (A) (by C-G formula based on SCr of 11.5 mg/dL (H)).    Allergies  Allergen Reactions  . Adhesive [Tape] Rash  . Lyrica [Pregabalin] Other (See Comments)    Hallucinations    Thank you for allowing pharmacy to be a part of this patient's care.  Wynona Neat, PharmD, BCPS  04/10/2019 12:35 AM

## 2019-04-10 NOTE — Progress Notes (Signed)
EEG complete - results pending 

## 2019-04-10 NOTE — Progress Notes (Addendum)
NAME:  [name], MRN:  [id number], DOB:  [birth year], LOS: 0 ADMISSION DATE:  03/27/2019, CONSULTATION DATE:  04/15/2019  REFERRING MD: Dr. Ardith Dark of the ER, CHIEF COMPLAINT: Acute encephalopathy  Brief History   74 yobf remote smoker with found down pm 3/20  and subsequent intubation on arrival  in the ER and pccm team asked to admit   History of present illness   74 year old morbidly obese female on Tuesday Thursday Saturday dialysis.  At baseline walks with a walker. Past medical history includes blindness in the right eye, chronic low back pain, coronary artery disease not otherwise specified, end-stage renal disease, OSA on CPAP, type 2 diabetes.  Multiple recent ER visits including March 02, 2019 for right knee pain, March 30, 2019 for dizziness with a negative MRI ("Old left occipital cortical and subcortical infarction. Chronic small-vessel ischemic changes throughout the brain elsewhere as outlined above.") and again on April 05, 2019 for hypoglycemia following dialysis.  Current history by the ER resident and the physician assistant and critical care: Was found unresponsive with unknown.  By family.  Brought in by EMS.  Intubated in the ED.  Son reports per nursing of the right side was weak.  Apparently has been seen by neurology MRI is pending.  According to the ER resident Dr. Apolonio Schneiders reported that she did notice that the patient was moving the right side.  At the time of this evaluation patient on propofol infusion with a RASS sedation score of -4 without any gag or cough with thin copious secretions through the ET tube.  Not responding to painful stimuli such as an IV stick.  Only 1 peripheral IV present.  And central line placed by the resident with supervision by critical care.     Past Medical History  ESRF OSA on CPAP AODM CHF AB/ nl pfts 10/2016        Significant Hospital Events   03/02/2019: ER visit for arthritic type knee pain and history of fall with  generalized weakness.  Right knee was swollen and warm.  Treated with as needed hydrocodone  03/30/2019: ER visit for generalized weakness, dizziness and vertigo following dialysis.Marland Kitchen  MRI negative.  04/05/2019: Hypoglycemia after dialysis.  Brought in confused and disoriented to the ER  03/29/2019: Unresponsive intubated admitted via the ER  Consults:  03/21/2019 - Neuro  3/21  Renal   Procedures:  03/29/2019 0ra;  ETT in ER 04/11/2019 - Left IJ in ER 04/10/19 -  R fem art line   Significant Diagnostic Tests:   MR Brain w/o contrast 3/20: 1. No acute intracranial abnormality. 2. Diffuse soft tissue swelling throughout the right frontoparietal and temporal scalp. Query recent trauma. 3. 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 4. Underlying age-related cerebral atrophy with moderate chronic microvascular ischemic disease, with multiple additional remote lacunar infarcts as above. 5. Diffuse mucosal thickening with scattered air-fluid levels seen throughout the paranasal sinuses. EEG am 3/21 :  gen slowing but  no sz   Micro/ID  Data:  Covid 19  3/20 neg  MRSA  3/21 : neg  Blood 3/20 ordered but  not done prior to abx ET 3/20 ordered not done prior to abx   Antimicrobials:  vanc 04/07/2019  X 2 gm > d/c'd 3/21 since mrsa screen neg (renal pt anyway so will linger) Cefepime 04/03/2019 >>>     Scheduled Meds: . acetaminophen  650 mg Rectal Once  . chlorhexidine gluconate (MEDLINE KIT)  15 mL Mouth Rinse  BID  . Chlorhexidine Gluconate Cloth  6 each Topical Daily  . heparin  5,000 Units Subcutaneous Q8H  . insulin aspart  1-3 Units Subcutaneous Q4H  . ipratropium-albuterol  3 mL Nebulization Q6H  . mouth rinse  15 mL Mouth Rinse 10 times per day  . pantoprazole sodium  40 mg Per Tube Daily  . sodium chloride flush  10-40 mL Intracatheter Q12H  . sodium zirconium cyclosilicate  10 g Oral Once   Continuous Infusions: . [START ON 04/12/2019] ceFEPime (MAXIPIME)  IV    . dextrose    . dextrose 5 % and 0.45% NaCl 10 mL/hr at 04/10/19 0600  . fentaNYL infusion INTRAVENOUS 25 mcg/hr (04/10/19 0600)  . levETIRAcetam Stopped (04/10/19 0525)  . niCARDipine Stopped (04/10/19 0246)  . propofol (DIPRIVAN) infusion 20 mcg/kg/min (04/10/19 0600)  . [START ON 04/12/2019] vancomycin     PRN Meds:.fentaNYL (SUBLIMAZE) injection, fentaNYL (SUBLIMAZE) injection, hydrALAZINE, midazolam, midazolam, sodium chloride flush   Interim history/subjective:  ? Myoclonus vs sz overnight on diprovan drip  > stat eeg in progress   Objective   Blood pressure (!) 108/94, pulse (!) 102, temperature (!) 101.3 F (38.5 C), resp. rate 19, SpO2 99 %.    Vent Mode: CPAP;PSV FiO2 (%):  [40 %-100 %] 40 % Set Rate:  [16 bmp] 16 bmp Vt Set:  [500 mL] 500 mL PEEP:  [5 cmH20] 5 cmH20 Pressure Support:  [5 cmH20] 5 cmH20 Plateau Pressure:  [18 cmH20-23 cmH20] 21 cmH20   Intake/Output Summary (Last 24 hours) at 04/10/2019 0076 Last data filed at 04/10/2019 0600 Gross per 24 hour  Intake 310.6 ml  Output --  Net 310.6 ml   There were no vitals filed for this visit.  Examination: Pt comatose/ ? Posturing with ? slt nystagmus  No jvd Oropharynx et inplace  Neck supple Lungs with a few scattered exp > insp rhonchi bilaterally RRR no s3 or or sign murmur Abd obese with limited  excursion  Extr warm with no edema or clubbing noted   pCXR PA and Lateral:   3/20     I personally reviewed images and agree with radiology impression as follows:   Left central line tip in the upper SVC. No pneumothorax. Interval removal of NG tube. Improving aeration in the right upper lobe. Cardiomegaly, vascular congestion.    Resolved Hospital Problem list   ASSESSMENT / PLAN:    Acute Respiratory Failure due to acute encephalopathy - intubated in ER - pfts 2018 s copd or any airflow obst   P:   Full vent suport s weaning for now until sort out cns issues  HOB > 30 deg VAP  bundle Added duoneb 3/21  Needs Arterial access to be sure re accurate abgs and bp's > will attempt by u/s today     COVID and flu negative. Possible HCAP/CAP - new RUL infiltrate on cxr 3/20/2021and sinusitis on MRI Head  - Fever 3/21 am  ? Cns  >>>  see microflow sheet/ note cultures not done prior to abx but leave on maxepime for now    A:   Acute obtunded encephalopathy (recent er visits due to confusion feb and march 2021 - one of them due to hypoglycemia) . ? rigght sided weakness - if stroke out of window for TPA. Soft tissue scalp swelling on CT head  Per Dr Lorraine Lax - > 1am : suspect hypertensive encephalopathy   P:   Per NEURO      A:  Hypertensive - SBP 220 P:  Goal sbp 140-160 Hydralaizine prn  Renal consulted am 3/21    A: Known C-Diast CHF (normal cath 2019) on echo may 2020 Mild MS and AS on echo May 2020  P: Monitor for pulm edema    CARDIAC ELECTRICAL - trop pos at admit with no ischemic changes on ekg and h/o nl LHC 03/2017 A: NSR but at risk for arrythmia P: Tele    P:   RX broadly empiric   A:  ESRD on TTS HD P:  Renal consult needs to be called   A:  Hyperkalemic P: lokelma prn Renal consulted    A:   NPO  P:   TF could not pass  ppi   HEMATOLOGIC   - HEME A:  At risk anemia critical illness   Lab Results  Component Value Date   HGB 12.6 04/10/2019   HGB 15.0 04/08/2019   HGB 16.0 (H) 04/04/2019      P:  - PRBC for hgb </= 6.9gm%    - exceptions are   -  if ACS susepcted/confirmed then transfuse for hgb </= 8.0gm%,  or    -  active bleeding with hemodynamic instability, then transfuse regardless of hemoglobin value   At at all times try to transfuse 1 unit prbc as possible with exception of active hemorrhage   A:   DM - hypoglyucemic ER visit in march 2021   P:   CBG q1h ssi  Enlarged Thyroid left lobe on admit CT 04/08/2019  Plan  = need to ensure safeto extubate  - might need TTNA biopsy while in  hospital if survives the acute illness    At risk sacral decub  Plan  - monitor        Best practice:  Diet: npo. Needs tube placement by IR this coming week  Pain/Anxiety/Delirium protocol (if indicated): diprivan drip  and fent  prn VAP protocol (if indicated): HOB > 30 DVT prophylaxis: heparin sq GI prophylaxis: ppi Glucose control: ssi Mobility: bed rest Code Status: full code Family Communication: daughter and cousing outside ER updated pm 3/20  Disposition: ICU          LABS    PULMONARY Recent Labs  Lab 04/07/2019 2108 04/01/2019 2109 04/11/2019 2241  PHART  --   --  7.292*  PCO2ART  --   --  55.9*  PO2ART  --   --  81.0*  HCO3 24.6  --  26.6  TCO2 '26 27 28  ' O2SAT 96.0  --  93.0    CBC Recent Labs  Lab 04/05/19 1250 04/05/19 1250 04/10/2019 2105 04/12/2019 2108 04/06/2019 2109 04/01/2019 2241 04/10/19 0525  HGB 10.8*   < > 14.7   < > 16.0* 15.0 12.6  HCT 35.2*   < > 46.5*   < > 47.0* 44.0 39.6  WBC 5.9  --  10.4  --   --   --  13.0*  PLT 201  --  222  --   --   --  168   < > = values in this interval not displayed.    COAGULATION Recent Labs  Lab 04/10/19 0525  INR 1.2    CARDIAC  No results for input(s): TROPONINI in the last 168 hours. No results for input(s): PROBNP in the last 168 hours.   CHEMISTRY Recent Labs  Lab 04/05/19 1250 04/05/19 1250 04/18/2019 2105 04/11/2019 2105 04/17/2019 2108 04/12/2019 2108 03/29/2019 2109 04/05/2019 2109 04/18/2019 2241 04/10/19 0355  NA 139   < > 141  --  139  --  139  --  139 142  K 4.6   < > 6.6*   < > 6.2*   < > 6.2*   < > 5.2* 5.5*  CL 93*  --  101  --   --   --  105  --   --  103  CO2 32  --  19*  --   --   --   --   --   --  22  GLUCOSE 112*  --  186*  --   --   --  179*  --   --  106*  BUN 25*  --  81*  --   --   --  69*  --   --  86*  CREATININE 6.22*  --  10.67*  --   --   --  11.50*  --   --  10.92*  CALCIUM 8.7*  --  8.7*  --   --   --   --   --   --  9.0  MG  --   --   --   --   --   --    --   --   --  2.3  PHOS  --   --   --   --   --   --   --   --   --  6.6*   < > = values in this interval not displayed.   Estimated Creatinine Clearance: 5.8 mL/min (A) (by C-G formula based on SCr of 10.92 mg/dL (H)).   LIVER Recent Labs  Lab 04/05/19 1250 04/08/2019 2105 04/10/19 0525  AST 32 33  --   ALT 25 25  --   ALKPHOS 70 79  --   BILITOT 0.7 0.8  --   PROT 7.3 7.4  --   ALBUMIN 3.4* 3.0*  --   INR  --   --  1.2     INFECTIOUS Recent Labs  Lab 04/10/19 0525  LATICACIDVEN 1.1     ENDOCRINE CBG (last 3)  Recent Labs    04/18/2019 2114 04/10/19 0324  GLUCAP 143* 93     The patient is critically ill with multiple organ systems failure and requires high complexity decision making for assessment and support, frequent evaluation and titration of therapies, application of advanced monitoring technologies and extensive interpretation of multiple databases. Critical Care Time devoted to patient care services described in this note is 60 minutes.    Christinia Gully, MD Pulmonary and [place] [phone] After 6:00 PM or weekends, use [facility] [phone]  After 7:00 pm call Elink  [phone]

## 2019-04-10 NOTE — Progress Notes (Signed)
LTM EEG hooked up and running - no initial skin breakdown - push button tested - neuro notified.  

## 2019-04-10 NOTE — Progress Notes (Signed)
Patient placed back on full support vent settings per MD. Will try to wean once more stable.

## 2019-04-10 NOTE — Progress Notes (Signed)
Subjective: Now with PNA on CXR  Exam: Vitals:   04/10/19 1100 04/10/19 1112  BP: 132/66   Pulse:    Resp:    Temp:    SpO2:  99%   Gen: In bed, NAD Resp: non-labored breathing, no acute distress Abd: soft, nt  Neuro: MS: opens eyes but does not follow commands QI:HKVQQV sluggish, blinks to eyelid stimulation.  Motor: moves all extremities, L > R Sensory:responds to nox stim x 4.   She has rigors on my exam, no EEG correlate.   Pertinent Labs: Leukocytosis at 13 UDS pending BUN 81 yesterday  Impression: 74 year old female who was found down.  Possibilities include septic encephalopath, metabolic encephalopathy, arrhythmia (K was 6.6)  versus unwitnessed seizure.  She does have old cortical infarcts, but I do think we have definite evidence that she had seizure activity.  She appears improved compared to the exam documented by Dr. Lorraine Lax overnight.  At this point I would continue Keppra for now, but may need to readdress this over the long-term.  Recommendations: 1) continue Keppra 2) LTM EEG overnight 3) ammonia, TSH  4) continue supportive care per critical care  Roland Rack, MD [facility] [phone]  If 7pm- 7am, please page neurology on call as listed in [place].

## 2019-04-10 NOTE — Progress Notes (Signed)
[facility] Progress Note Patient Name: [name] DOB: April 08, 1945 MRN: [id number]   Date of Service  04/10/2019  HPI/Events of Note  RT reports that they were unable to insert arterial line.  eICU Interventions  I've asked that they get a colleague to try as Pt is on a Cardene infusion.        Kerry Kass Linzee Depaul 04/10/2019, 5:38 AM

## 2019-04-10 NOTE — Progress Notes (Signed)
ABG attempted and unable to obtain, [facility] called and notified

## 2019-04-10 NOTE — Progress Notes (Signed)
Brief Nutrition Note  RD consulted for initiation of tube feeding, but pt currently without access. RD spoke with RN who reports 3 failed attempts at achieving access. Per RN, plan is to have Cortrak/IR placement tomorrow. RD will place Cortrak orders and put in tube feeding protocol.   Larkin Ina, MS, RD, LDN RD pager number and weekend/on-call pager number located in [place].

## 2019-04-10 NOTE — Consult Note (Addendum)
[facility] Renal Consultation Note    Indication for Consultation:  Management of ESRD/hemodialysis; anemia, hypertension/volume and secondary hyperparathyroidism  YBW:[id number], Nicki Reaper, MD  HPI: [name] is a 74 y.o. female. ESRD 2/2 DM on HD TTS at [facility].  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   History obtained through chart review due to patient mental status.  Currently intubated and sedated. Patient was found unresponsive yesterday evening with unknown down time.  Brought in by EMS and intubated in the ED.  Outpatient records show patient missed dialysis yesterday.  Last dialysis on 3/18, where she completed all but 10 min of her treatment and close to her dry weight.    Pertinent findings since admission include febrile w/tmax 101.7, K 5.6, troponin 1035, WBC 13, CT head/neck with no acute findings, MRI brain w/no acute findings and scalp swelling consistent with possible recent trauma, abnormal EEG 2/2 background slowing without epileptiform activity, and CXR showing possible PNA.  Patient has been admitted for further evaluation and management.   Past Medical History:  Diagnosis Date  . Abdominal abscess 12-17-10   abdominal abscesses x2 ? one at this time  . Anemia   . Arthritis    "all over" (08/23/2012)  . Blind right eye    fell and crushed socket and eye fell out, was replaced at [facility]  . CHF (congestive heart failure) ([place])   . Chronic lower back pain   . Coronary artery disease    normal coronaries by 08/25/12 cath  . Dyspnea   . Dysrhythmia    irregular, "skips beats"  . ESRD (end stage renal disease) on dialysis ([place])    "started 12/2011; [place]; TTS" (08/23/2012  . Exertional shortness of breath   . Eye drainage    "lots; since fall 07/2011" (08/23/2012)  . Gout   . H/O hiatal hernia   . Headache(784.0)    "weekly sometimes; since I  fell and hit my head in 07/2011 08/23/2012)  . Heart murmur   . History of blood transfusion    "lots before starting dialysis" (08/23/2012)  . Hypertension    sees Dr. Willey Blade  . Inhalation injury    "worked at [employer]; can't inhale polyurethane or paint, etc" (08/23/2012)  . Myocardial infarction ([place]) 1970's or 1980's  . Neuromuscular disorder ([place])    diabetic neuropathy  . OSA on CPAP   . Staph aureus infection November 2012   . Swelling of both ankles 12-17-10  . Type II diabetes mellitus ([place])    "over 10 years now" (08/23/2012)   Past Surgical History:  Procedure Laterality Date  . [place] VITRECTOMY WITH 20 GAUGE MVR PORT Left 05/07/2017   Procedure: 25 GAUGE PARS PLANA VITRECTOMY WITH ENDOLASER LEFT EYE;  Surgeon: Bernarda Caffey, MD;  Location: [place];  Service: Ophthalmology;  Laterality: Left;  . APPENDECTOMY    . AV FISTULA PLACEMENT  09/30/2011   Procedure: ARTERIOVENOUS (AV) FISTULA CREATION;  Surgeon: Conrad Luther, MD;  Location: [place];  Service: Vascular;  Laterality: Left;  BRACHIAL-CEPHALIC  . BACK SURGERY    . BASCILIC VEIN TRANSPOSITION Left 11/10/2011   Left arm   . [place] TRANSPOSITION  01/28/2012   Procedure: BASCILIC VEIN TRANSPOSITION;  Surgeon: Conrad Red Lake, MD;  Location: [place];  Service: Vascular;  Laterality: Left;  Second Stage   . BIOPSY  08/24/2017   Procedure: BIOPSY;  Surgeon: Wilford Corner, MD;  Location: [facility];  Service: Endoscopy;;  . CARDIAC CATHETERIZATION  1980's  . CATARACT EXTRACTION W/ INTRAOCULAR LENS IMPLANT Bilateral 1990's  . COLONOSCOPY WITH PROPOFOL N/A 08/24/2017   Procedure: COLONOSCOPY WITH PROPOFOL;  Surgeon: Wilford Corner, MD;  [facility]: [facility];  Service: Endoscopy;  Laterality: N/A;  . CORONARY ANGIOGRAPHY N/A 04/07/2017   Procedure: CORONARY ANGIOGRAPHY (CATH LAB);  Surgeon: Dixie Dials, MD;  Location: [place] CV LAB;  Service: Cardiovascular;  Laterality: N/A;  . DILATION AND CURETTAGE  OF UTERUS  1960's  . ESOPHAGOGASTRODUODENOSCOPY (EGD) WITH PROPOFOL N/A 08/24/2017   Procedure: ESOPHAGOGASTRODUODENOSCOPY (EGD) WITH PROPOFOL;  Surgeon: Wilford Corner, MD;  [facility];  Service: Endoscopy;  Laterality: N/A;  . EYE SURGERY    . FISTULA SUPERFICIALIZATION Left 02/18/2017   Procedure: SUPERFICIALIZATION LEFT ARM ARTERIOVENOUS FISTULA;  Surgeon: Serafina Mitchell, MD;  Location: [place];  Service: Vascular;  Laterality: Left;  . HERNIA REPAIR    . INSERTION OF DIALYSIS CATHETER  01/05/2012   Procedure: INSERTION OF DIALYSIS CATHETER;  Surgeon: Conrad Fruitdale, MD;  Location: [place];  Service: Vascular;  Laterality: N/A;  Right Internal Jugular Placement  . INTRAOCULAR PROSTHESES INSERTION Right 2013   "fell; knocked my eye outl" (08/23/2012)  . JOINT REPLACEMENT    . LAPAROTOMY  12/18/2010   Procedure: EXPLORATORY LAPAROTOMY;  Surgeon: Rolm Bookbinder, MD;  Location: [facility];  Service: General;  Laterality: N/A;  Abdominal Seroma Evacuation  . LEFT HEART CATHETERIZATION WITH CORONARY ANGIOGRAM N/A 08/25/2012   Procedure: LEFT HEART CATHETERIZATION WITH CORONARY ANGIOGRAM;  Surgeon: Birdie Riddle, MD;  Location: [place] CATH LAB;  Service: Cardiovascular;  Laterality: N/A;  . LUMBAR DISC SURGERY  1970'-80's   X 3  . TOTAL KNEE ARTHROPLASTY Left 1980's  . VAGINAL HYSTERECTOMY  1970's  . VENTRAL HERNIA REPAIR  2012-2013   component separation, repair with biologic; "had 3 surgeries to fix it" (08/23/2012)  . WOUND DEBRIDEMENT  03/20/2011   Procedure: DEBRIDEMENT ABDOMINAL WOUND;  Surgeon: Rolm Bookbinder, MD;  Location: [place];  Service: General;  Laterality: N/A;  debridement abdominal wall, placement of wound vac   Family History  Problem Relation Age of Onset  . Hypertension Mother   . Cancer Brother        spine  . Hypertension Father   . Cancer Sister        brain  . Hypertension Maternal Grandmother   . Anesthesia problems Neg Hx    Social History:  reports that she  quit smoking about 38 years ago. Her smoking use included cigarettes. She has a 10.00 pack-year smoking history. She has never used smokeless tobacco. She reports that she does not drink alcohol or use drugs. Allergies  Allergen Reactions  . Adhesive [Tape] Rash  . Lyrica [Pregabalin] Other (See Comments)    Hallucinations   Prior to Admission medications   Medication Sig Start Date End Date Taking? Authorizing Provider  acetaminophen (TYLENOL) 325 MG tablet Take 2 tablets (650 mg total) by mouth every 6 (six) hours as needed for mild pain (or Fever >/= 101). 10/12/18  Yes Arrien, Jimmy Picket, MD  albuterol (PROVENTIL) (2.5 MG/3ML) 0.083% nebulizer solution Take 3 mLs (2.5 mg total) by nebulization every 4 (four) hours as needed for shortness of breath. Patient taking differently: Take 2.5 mg by nebulization 3 (three) times daily.  06/22/18  Yes Dixie Dials, MD  allopurinol (ZYLOPRIM) 100 MG tablet Take 0.5  tablets (50 mg total) by mouth daily. Patient taking differently: Take 50 mg by mouth daily as needed (for gout flares).  06/23/18  Yes Dixie Dials, MD  aspirin EC 81 MG tablet Take 81 mg by mouth daily.     Yes [provider]  atorvastatin (LIPITOR) 40 MG tablet Take 40 mg by mouth daily.   Yes [provider]  budesonide-formoterol (SYMBICORT) 160-4.5 MCG/ACT inhaler Inhale 2 puffs into the lungs 2 (two) times daily. 06/07/18  Yes Lauraine Rinne, NP  cloNIDine (CATAPRES) 0.1 MG tablet Take 0.1 mg by mouth in the morning, at noon, in the evening, and at bedtime.  03/26/19  Yes [provider]  meclizine (ANTIVERT) 50 MG tablet Take 1 tablet (50 mg total) by mouth 3 (three) times daily as needed. Patient taking differently: Take 50 mg by mouth 3 (three) times daily as needed for dizziness.  03/30/19  Yes Deno Etienne, DO  methocarbamol (ROBAXIN) 500 MG tablet Take 500 mg by mouth 3 (three) times daily as needed for muscle spasms. 03/21/19  Yes [provider]   Multiple Vitamins-Minerals (ONE-A-DAY WOMENS PO) Take 1 tablet by mouth daily with breakfast.   Yes [provider]  oxyCODONE-acetaminophen (PERCOCET/ROXICET) 5-325 MG tablet Take 1 tablet by mouth every 6 (six) hours as needed for moderate pain. 10/12/18  Yes Arrien, Jimmy Picket, MD  polyethylene glycol (MIRALAX / GLYCOLAX) 17 g packet Take 17 g by mouth daily. Patient taking differently: Take 17 g by mouth daily as needed for mild constipation.  10/13/18  Yes Arrien, Jimmy Picket, MD  sevelamer carbonate (RENVELA) 800 MG tablet Take 4 tablets (3,200 mg total) by mouth 3 (three) times daily with meals. 06/22/18  Yes Dixie Dials, MD  Tiotropium Bromide Monohydrate (SPIRIVA RESPIMAT) 2.5 MCG/ACT AERS Inhale 2 puffs into the lungs daily. Patient taking differently: Inhale 1 puff into the lungs daily.  06/07/18  Yes Lauraine Rinne, NP  doxercalciferol (HECTOROL) 4 MCG/2ML injection Inject 1.5 mLs (3 mcg total) into the vein Every Tuesday,Thursday,and Saturday with dialysis. Patient not taking: Reported on 04/10/2019 06/22/18   Dixie Dials, MD  HYDROcodone-acetaminophen (NORCO/VICODIN) 5-325 MG tablet Take 1 tablet by mouth every 8 (eight) hours as needed. Patient not taking: Reported on 03/30/2019 03/02/19   Couture, Cortni S, PA-C   Current Facility-Administered Medications  Medication Dose Route Frequency Provider Last Rate Last Admin  . 0.9 %  sodium chloride infusion  100 mL Intravenous PRN Penninger, Ria Comment, PA      . 0.9 %  sodium chloride infusion  100 mL Intravenous PRN Penninger, Ria Comment, PA      . acetaminophen (TYLENOL) suppository 650 mg  650 mg Rectal Once Sherwood Gambler, MD      . alteplase (CATHFLO ACTIVASE) injection 2 mg  2 mg Intracatheter Once PRN Penninger, Ria Comment, PA      . [START ON 04/12/2019] ceFEPIme (MAXIPIME) 2 g in sodium chloride 0.9 % 100 mL IVPB  2 g Intravenous Q T,Th,Sat-1800 Bryk, Veronda P, RPH      . chlorhexidine gluconate (MEDLINE KIT) (PERIDEX)  0.12 % solution 15 mL  15 mL Mouth Rinse BID Brand Males, MD   15 mL at 04/10/19 0957  . [START ON 04/11/2019] Chlorhexidine Gluconate Cloth 2 % PADS 6 each  6 each Topical Q0600 Penninger, Lindsay, PA      . dextrose 10 % infusion   Intravenous Once Sherwood Gambler, MD      . dextrose 5 %-0.45 % sodium chloride  infusion   Intravenous Continuous Brand Males, MD 10 mL/hr at 04/10/19 0600 Rate Verify at 04/10/19 0600  . feeding supplement (PRO-STAT SUGAR FREE 64) liquid 30 mL  30 mL Per Tube BID Brand Males, MD      . feeding supplement (VITAL HIGH PROTEIN) liquid 1,000 mL  1,000 mL Per Tube Q24H Brand Males, MD      . fentaNYL (SUBLIMAZE) injection 25 mcg  25 mcg Intravenous Q15 min PRN Brand Males, MD      . fentaNYL (SUBLIMAZE) injection 25-100 mcg  25-100 mcg Intravenous Q30 min PRN Brand Males, MD      . fentaNYL 2540mg in NS 2589m(1046mml) infusion-PREMIX  0-200 mcg/hr Intravenous Continuous Ogan, [place] U, MD 2.5 mL/hr at 04/10/19 0600 25 mcg/hr at 04/10/19 0600  . heparin injection 1,000 Units  1,000 Units Dialysis PRN Penninger, LinRia CommentA      . heparin injection 5,000 Units  5,000 Units Subcutaneous Q8H RamBrand MalesD   5,000 Units at 04/10/19 1321  . hydrALAZINE (APRESOLINE) injection 10-40 mg  10-40 mg Intravenous Q4H PRN RamBrand MalesD      . insulin aspart (novoLOG) injection 1-3 Units  1-3 Units Subcutaneous Q4H Ramaswamy, Murali, MD      . ipratropium-albuterol (DUONEB) 0.5-2.5 (3) MG/3ML nebulizer solution 3 mL  3 mL Nebulization Q6H RamBrand MalesD   3 mL at 04/10/19 1457  . levETIRAcetam (KEPPRA) IVPB 500 mg/100 mL premix  500 mg Intravenous Q12H Aroor, SusLanice SchwabD   Stopped at 04/10/19 0525  . lidocaine (PF) (XYLOCAINE) 1 % injection 5 mL  5 mL Intradermal PRN Penninger, LinRia CommentA      . lidocaine-prilocaine (EMLA) cream 1 application  1 application Topical PRN Penninger, Lindsay, PA      . MEDLINE mouth rinse  15  mL Mouth Rinse 10 times per day RamBrand MalesD   15 mL at 04/10/19 1230  . midazolam (VERSED) injection 1 mg  1 mg Intravenous Q15 min PRN RamBrand MalesD      . midazolam (VERSED) injection 1 mg  1 mg Intravenous Q2H PRN RamBrand MalesD      . nicardipine (CARDENE) 28m69m 0.86% saline 200ml63minfusion (0.1 mg/ml)  0-15 mg/hr Intravenous Continuous Ogan,Frederik Pear  Stopped at 04/10/19 0246  . pantoprazole sodium (PROTONIX) 40 mg/20 mL oral suspension 40 mg  40 mg Per Tube Daily RamasBrand Males     . pentafluoroprop-tetrafluoroeth (GEBAUERS) aerosol 1 application  1 application Topical PRN Penninger, LindsRia Comment  [place] . propofol (DIPRIVAN) 1000 MG/100ML infusion  0-50 mcg/kg/min Intravenous Continuous RamasBrand Males24.2 mL/hr at 04/10/19 1222 35 mcg/kg/min at 04/10/19 1222  . sodium chloride flush (NS) 0.9 % injection 10-40 mL  10-40 mL Intracatheter Q12H RamasBrand Males  10 mL at 04/10/19 1000  . sodium chloride flush (NS) 0.9 % injection 10-40 mL  10-40 mL Intracatheter PRN RamasBrand Males     . sodium zirconium cyclosilicate (LOKELMA) packet 10 g  10 g Oral Once RamasBrand Males      Labs: Basic Metabolic Panel: Recent Labs  Lab 04/05/19 1250 04/05/19 1250 04/14/2019 2105 03/21/2019 2108 04/20/2019 2109 03/26/2019 2109 04/08/2019 2241 04/10/19 0525 04/10/19 1122  NA 139   < > 141   < > 139   < > 139 142 139  K 4.6   < > 6.6*   < > 6.2*   < >  5.2* 5.5* 5.6*  CL 93*   < > 101  --  105  --   --  103  --   CO2 32  --  19*  --   --   --   --  22  --   GLUCOSE 112*   < > 186*  --  179*  --   --  106*  --   BUN 25*   < > 81*  --  69*  --   --  86*  --   CREATININE 6.22*   < > 10.67*  --  11.50*  --   --  10.92*  --   CALCIUM 8.7*  --  8.7*  --   --   --   --  9.0  --   PHOS  --   --   --   --   --   --   --  6.6*  --    < > = values in this interval not displayed.   Liver Function Tests: Recent Labs  Lab 04/05/19 1250 04/05/2019 2105   AST 32 33  ALT 25 25  ALKPHOS 70 79  BILITOT 0.7 0.8  PROT 7.3 7.4  ALBUMIN 3.4* 3.0*   No results for input(s): LIPASE, AMYLASE in the last 168 hours. No results for input(s): AMMONIA in the last 168 hours. CBC: Recent Labs  Lab 04/05/19 1250 04/05/19 1250 03/30/2019 2105 03/22/2019 2108 03/21/2019 2241 04/10/19 0525 04/10/19 1122  WBC 5.9  --  10.4  --   --  13.0*  --   NEUTROABS  --   --  8.0*  --   --   --   --   HGB 10.8*   < > 14.7   < > 15.0 12.6 12.6  HCT 35.2*   < > 46.5*   < > 44.0 39.6 37.0  MCV 88.9  --  86.3  --   --  86.1  --   PLT 201  --  222  --   --  168  --    < > = values in this interval not displayed.   Cardiac Enzymes: No results for input(s): CKTOTAL, CKMB, CKMBINDEX, TROPONINI in the last 168 hours. CBG: Recent Labs  Lab 04/05/19 1258 04/11/2019 2114 04/10/19 0324 04/10/19 0816 04/10/19 1120  GLUCAP 99 143* 93 95 81   Iron Studies: No results for input(s): IRON, TIBC, TRANSFERRIN, FERRITIN in the last 72 hours. Studies/Results: EEG  Result Date: 04/10/2019 Alexis Goodell, MD     04/10/2019 10:29 AM ELECTROENCEPHALOGRAM REPORT Patient: [facility]       Room #: 4N31C EEG No. ID: [id number] Age: 74 y.o.        Sex: female Requesting Physician: Ramaswamy Report Date:  04/10/2019       Interpreting Physician: Alexis Goodell History: ZAHARAH AMIR is an 74 y.o. female with altered mental status Medications: Keppra, Fentanyl, Propofol, Cefepime, Insulin Conditions of Recording:  This is a 21 channel routine scalp EEG performed with bipolar and monopolar montages arranged in accordance to the international 10/20 system of electrode placement. One channel was dedicated to EKG recording. The patient is in the intubated and sedated state. Description:  The background activity is slow and poorly organized.  It consists of a low voltage, polymorphic delta activity that is continuous and diffusely distributed.  No epileptiform activity is noted.  The patient is  stimulated during the recording with activation of the background activity noted.  Hyperventilation and intermittent photic stimulation were not performed. IMPRESSION: This is an abnormal EEG secondary to general background slowing.  This finding may be seen with a diffuse disturbance that is etiologically nonspecific, but may include a metabolic encephalopathy or medication effect, among other possibilities.  No epileptiform activity is noted.  Alexis Goodell, MD Neurology [phone] 04/10/2019, 10:25 AM   CT Head Wo Contrast  Result Date: 04/02/2019 CLINICAL DATA:  Altered mental status. Patient is on a ventilator. EXAM: CT HEAD WITHOUT CONTRAST TECHNIQUE: Contiguous axial images were obtained from the base of the skull through the vertex without intravenous contrast. COMPARISON:  None. FINDINGS: Brain: There is mild cerebral atrophy with widening of the extra-axial spaces and ventricular dilatation. There are areas of decreased attenuation within the white matter tracts of the supratentorial brain, consistent with microvascular disease changes. Vascular: No hyperdense vessel or unexpected calcification. Skull: Normal. Negative for fracture or focal lesion. Sinuses/Orbits: There is mild to moderate severity bilateral ethmoid sinus mucosal thickening. A prosthetic right globe is noted. This is present on the prior study. Other: Mild to moderate severity right parietal scalp soft tissue swelling is seen. This represents a new finding when compared to the prior exam. IMPRESSION: 1. Mild to moderate severity right parietal scalp soft tissue swelling. This represents a new finding when compared to the prior study dated March 30, 2019. 2. Findings of microvascular disease, and cerebral atrophy. 3. No acute intracranial process. 4. Ethmoid sinus mucosal thickening. Electronically Signed   By: Virgina Norfolk M.D.   On: 04/05/2019 21:48   CT Cervical Spine Wo Contrast  Result Date: 04/10/2019 CLINICAL DATA:   Status post trauma. EXAM: CT CERVICAL SPINE WITHOUT CONTRAST TECHNIQUE: Multidetector CT imaging of the cervical spine was performed without intravenous contrast. Multiplanar CT image reconstructions were also generated. COMPARISON:  None. FINDINGS: Alignment: Normal. Skull base and vertebrae: No acute fracture. No primary bone lesion or focal pathologic process. Soft tissues and spinal canal: No prevertebral fluid or swelling. No visible canal hematoma. Disc levels: Very mild multilevel endplate sclerosis is seen with mild anterior osteophyte formation noted at the level of C5-C6. Mild multilevel intervertebral disc space narrowing is seen. Upper chest: The left lobe of the thyroid gland is markedly enlarged and heterogeneous in appearance. Moderate severity calcification of the left lobe of the thyroid gland is also seen. Other: An endotracheal tube is in place. IMPRESSION: 1. No acute fracture or subluxation identified. 2. Mild multilevel degenerative disc disease. 3. Markedly enlarged and heterogeneous left lobe of the thyroid gland. Recommend further evaluation with a nonemergent thyroid ultrasound. Electronically Signed   By: Virgina Norfolk M.D.   On: 04/08/2019 21:55   MR BRAIN WO CONTRAST  Result Date: 04/10/2019 CLINICAL DATA:  Initial evaluation for acute neuro deficit, stroke suspected. EXAM: MRI HEAD WITHOUT CONTRAST TECHNIQUE: Multiplanar, multiecho pulse sequences of the brain and surrounding structures were obtained without intravenous contrast. COMPARISON:  Prior head CT from 04/10/2019 as well as recent MRI from 03/30/2019. FINDINGS: Brain: Mild diffuse prominence of the CSF containing spaces compatible with generalized age-related cerebral atrophy. Patchy T2/FLAIR hyperintensity within the periventricular deep white matter both cerebral hemispheres most consistent with chronic small vessel ischemic disease, moderate in nature, and stable from previous. Superimposed remote lacunar infarcts  present at the left basal ganglia, right thalamus, right pons, and right cerebellum. Encephalomalacia she a and gliosis involving the cortical subcortical left occipital lobe compatible with chronic left PCA territory infarct. Additional small chronic posterior right MCA territory  infarcts seen involving the right postcentral gyrus. Appearance is stable from previous. No abnormal foci of restricted diffusion to suggest acute or subacute ischemia. Gray-white matter differentiation otherwise maintained. No foci of susceptibility artifact to suggest acute or chronic intracranial hemorrhage. No mass lesion, midline shift or mass effect. No hydrocephalus or extra-axial fluid collection. No made of a partially empty sella. Midline structures intact. Vascular: Major intracranial vascular flow voids are maintained. Skull and upper cervical spine: Craniocervical junction within normal limits. Diffusely decreased T1 signal intensity seen throughout the visualized bone marrow, likely related history of end-stage renal disease. No focal marrow replacing lesion. Diffuse soft tissue swelling seen throughout the right frontoparietal and temporal scalp. Sinuses/Orbits: Right ocular prosthesis noted. Patient status post ocular lens replacement on the left. Diffuse mucosal thickening with scattered air-fluid level seen throughout the paranasal sinuses. Patient appears to be intubated. Trace bilateral mastoid effusions noted. Other: None. IMPRESSION: 1. No acute intracranial abnormality. 2. Diffuse soft tissue swelling throughout the right frontoparietal and temporal scalp. Query recent trauma. 3. 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 4. Underlying age-related cerebral atrophy with moderate chronic microvascular ischemic disease, with multiple additional remote lacunar infarcts as above. Electronically Signed   By: Jeannine Boga M.D.   On: 04/10/2019 00:47   DG Chest Portable 1 View  Result Date:  04/16/2019 CLINICAL DATA:  Central line placement EXAM: PORTABLE CHEST 1 VIEW COMPARISON:  03/12/2019 FINDINGS: Left central line is been placed. The tip is in the upper SVC. Endotracheal tube is unchanged. Interval removal of NG tube. Cardiomegaly. Improved aeration in the right upper lobe. Continued right upper lobe atelectasis or infiltrate. Vascular congestion. No effusions or acute bony abnormality. IMPRESSION: Left central line tip in the upper SVC. No pneumothorax. Interval removal of NG tube. Improving aeration in the right upper lobe. Cardiomegaly, vascular congestion. Electronically Signed   By: Rolm Baptise M.D.   On: 03/31/2019 23:47   DG Chest Portable 1 View  Result Date: 04/12/2019 CLINICAL DATA:  Nasogastric tube placement. EXAM: PORTABLE CHEST 1 VIEW COMPARISON:  None. FINDINGS: An endotracheal tube is seen with its distal tip approximately 3.5 cm from the carina. A nasogastric tube is noted. Its distal end is limited in visualization secondary to image cut off and appears to sit within the distal aspect of the esophagus. There is marked severity opacifications of the right upper lobe which represents a new finding when compared to the prior exam. Mild atelectasis and/or infiltrate is seen within the bilateral lung bases. The costophrenic angles are limited in evaluation secondary to image con off. The cardiac silhouette is enlarged and unchanged in size. Degenerative changes are seen throughout the thoracic spine. IMPRESSION: 1. Endotracheal tube and nasogastric tube as described above. Further advancement of the NG tube by approximately 8 cm is recommended to decrease the risk of aspiration. 2. Marked severity opacification of the right upper lobe which represents a new finding when compared to prior exam and is likely consistent with marked severity atelectasis and/or infiltrate. Electronically Signed   By: Virgina Norfolk M.D.   On: 04/05/2019 22:43   DG Chest Portable 1 View  Result  Date: 04/13/2019 CLINICAL DATA:  Intubation EXAM: PORTABLE CHEST 1 VIEW COMPARISON:  12/31/2018 FINDINGS: NG tube coiled. This will likely in the esophagus need to be completely removed and replaced. Endotracheal tube tip is 4 cm above the carina. Cardiomegaly with vascular congestion. Patchy airspace disease in the right lung. No effusions or acute bony abnormality. IMPRESSION:  NG tube coils in the esophagus and likely will need to be completely removed and replaced. Cardiomegaly, vascular congestion. Patchy right lung airspace disease concerning for pneumonia. Electronically Signed   By: Rolm Baptise M.D.   On: 03/21/2019 21:30   DG Abd Portable 1 View  Result Date: 04/06/2019 CLINICAL DATA:  Status post orogastric tube placement. EXAM: PORTABLE ABDOMEN - 1 VIEW COMPARISON:  April 24, 2010 FINDINGS: The bowel gas pattern is normal. A nasogastric tube is not identified. No radio-opaque calculi or other significant radiographic abnormality are seen. Radiopaque surgical coils are seen overlying the pelvis. IMPRESSION: A nasogastric tube is not identified. Electronically Signed   By: Virgina Norfolk M.D.   On: 04/08/2019 22:44    ROS: Not completed due to patient mental status.    Physical Exam: Vitals:   04/10/19 1000 04/10/19 1100 04/10/19 1112 04/10/19 1458  BP: 108/61 132/66    Pulse:    97  Resp:    (!) 21  Temp: (!) 101.7 F (38.7 C)     TempSrc:      SpO2:   99% 98%     General: obese female, intubated and sedated Head: NCAT sclera not icteric  Neck: Supple.  Lungs: intubated. CTAB anteriorly  Heart: RRR. +2/6 murmur, no rubs or gallops.  Abdomen: soft, nontender, +BS, no guarding, no rebound tenderness Lower extremities:no edema, ischemic changes, or open wounds  Neuro: sedated Dialysis Access:LU AVF +b/t  Dialysis Orders:  TTS - [place]  4.5hrs, BFR 450, DFR AF 1.5,  [id number], 2K/ 2Ca Profile 4  Access: LU AVF  Heparin 13000 Mircera 150 mcg q2wks - last 3/6 Venofer 39m  qwk Sensipar [id number] Calcitriol 1.75 mcg PO qHD   Assessment/Plan: 1. Acute obtunded encephalopathy - etiology unclear. Neuro and critical care following 2. Acute respiratory failure - intubated. PNA on CXR.  3. Hyperkalemia - K 5.6. medically treated last night, improved from 6.6 to 5.6.  No way to give lokelma.  Rechecking K.  Orders written for HD in the AM, if K>5.5 will likely need to dialyze tonight.  4.  ESRD -  On HD TTS. Missed last HD.  Plan for dialysis at bedside in the AM. Orders written.  No heparin. Repeat K+ is 6.1, plan HD tonight iinstead of tomorrow, 2-3 hrs.  5.  Hypertension/volume  - Blood pressure improved, now in goal.  Some edema on exam but does not appear volume overloaded.  Plan for UF 2L tomorrow.  6.  Anemia of CKD - Hgb 12.6. No indication for ESA at this time.  7.  Secondary Hyperparathyroidism -  Ca in goal.  Phos elevated. Will restart sensipar, binder & VDRA with tolerating PO.  8.  Nutrition - Renal diet w/fluid restrictions.  9. DMT2 10. OSA  LiJen MowPA-C [facility] Pager: [phone]/21/2021, 3:03 PM   Pt seen, examined and agree w assess/plan as above with additions as indicated.  [facility] 04/10/2019, 5:11 PM

## 2019-04-11 ENCOUNTER — Inpatient Hospital Stay (HOSPITAL_COMMUNITY): Payer: Medicare Other

## 2019-04-11 DIAGNOSIS — R4020 Unspecified coma: Secondary | ICD-10-CM

## 2019-04-11 DIAGNOSIS — R251 Tremor, unspecified: Secondary | ICD-10-CM

## 2019-04-11 DIAGNOSIS — G9341 Metabolic encephalopathy: Secondary | ICD-10-CM

## 2019-04-11 DIAGNOSIS — J690 Pneumonitis due to inhalation of food and vomit: Secondary | ICD-10-CM

## 2019-04-11 DIAGNOSIS — G92 Toxic encephalopathy: Secondary | ICD-10-CM

## 2019-04-11 DIAGNOSIS — N186 End stage renal disease: Secondary | ICD-10-CM

## 2019-04-11 LAB — POCT I-STAT 7, (LYTES, BLD GAS, ICA,H+H)
Acid-base deficit: 1 mmol/L (ref 0.0–2.0)
Bicarbonate: 24.5 mmol/L (ref 20.0–28.0)
Calcium, Ion: 1.13 mmol/L — ABNORMAL LOW (ref 1.15–1.40)
HCT: 31 % — ABNORMAL LOW (ref 36.0–46.0)
Hemoglobin: 10.5 g/dL — ABNORMAL LOW (ref 12.0–15.0)
O2 Saturation: 99 %
Patient temperature: 98.8
Potassium: 5.3 mmol/L — ABNORMAL HIGH (ref 3.5–5.1)
Sodium: 138 mmol/L (ref 135–145)
TCO2: 26 mmol/L (ref 22–32)
pCO2 arterial: 45.8 mmHg (ref 32.0–48.0)
pH, Arterial: 7.337 — ABNORMAL LOW (ref 7.350–7.450)
pO2, Arterial: 131 mmHg — ABNORMAL HIGH (ref 83.0–108.0)

## 2019-04-11 LAB — TRIGLYCERIDES: Triglycerides: 202 mg/dL — ABNORMAL HIGH (ref <150)

## 2019-04-11 LAB — GLUCOSE, CAPILLARY
Glucose-Capillary: 103 mg/dL — ABNORMAL HIGH (ref 70–99)
Glucose-Capillary: 111 mg/dL — ABNORMAL HIGH (ref 70–99)
Glucose-Capillary: 113 mg/dL — ABNORMAL HIGH (ref 70–99)
Glucose-Capillary: 120 mg/dL — ABNORMAL HIGH (ref 70–99)
Glucose-Capillary: 130 mg/dL — ABNORMAL HIGH (ref 70–99)
Glucose-Capillary: 86 mg/dL (ref 70–99)

## 2019-04-11 LAB — MAGNESIUM
Magnesium: 2.5 mg/dL — ABNORMAL HIGH (ref 1.7–2.4)
Magnesium: 2 mg/dL (ref 1.7–2.4)

## 2019-04-11 LAB — PHOSPHORUS
Phosphorus: 7.6 mg/dL — ABNORMAL HIGH (ref 2.5–4.6)
Phosphorus: 6.2 mg/dL — ABNORMAL HIGH (ref 2.5–4.6)

## 2019-04-11 LAB — LACTIC ACID, PLASMA: Lactic Acid, Venous: 0.8 mmol/L (ref 0.5–1.9)

## 2019-04-11 MED ORDER — METOPROLOL TARTRATE 5 MG/5ML IV SOLN
INTRAVENOUS | Status: AC
  Filled 2019-04-11: qty 5

## 2019-04-11 MED ORDER — VITAL HIGH PROTEIN PO LIQD
1000.0000 mL | ORAL | Status: DC
  Administered 2019-04-12 (×3): 1000 mL

## 2019-04-11 MED ORDER — METOPROLOL TARTRATE 5 MG/5ML IV SOLN
5.0000 mg | Freq: Once | INTRAVENOUS | Status: AC
  Administered 2019-04-11: 5 mg via INTRAVENOUS

## 2019-04-11 MED ORDER — PRO-STAT SUGAR FREE PO LIQD
60.0000 mL | Freq: Three times a day (TID) | ORAL | Status: DC
  Administered 2019-04-11 – 2019-04-12 (×5): 60 mL
  Filled 2019-04-11 (×5): qty 60

## 2019-04-11 MED ORDER — CHLORHEXIDINE GLUCONATE CLOTH 2 % EX PADS
6.0000 | MEDICATED_PAD | Freq: Every day | CUTANEOUS | Status: DC
  Administered 2019-04-12 – 2019-04-16 (×4): 6 via TOPICAL

## 2019-04-11 NOTE — Progress Notes (Addendum)
Neurology Progress Note   S:// Seen and examined.  On LTM. Exhibited full body tremulousness-at times stimulus induced but at times without any stimulation as well.   O:// Current vital signs: BP (!) 103/37   Pulse 75   Temp 98.9 F (37.2 C) (Axillary)   Resp 16   Wt 120.1 kg   SpO2 99%   BMI 41.47 kg/m  Vital signs in last 24 hours: Temp:  [98.8 F (37.1 C)-101.7 F (38.7 C)] 98.9 F (37.2 C) (03/22 0800) Pulse Rate:  [75-106] 75 (03/22 0805) Resp:  [15-24] 16 (03/22 0805) BP: (92-143)/(34-81) 103/37 (03/22 0805) SpO2:  [98 %-100 %] 99 % (03/22 0805) Arterial Line BP: (138-190)/(57-82) 148/76 (03/22 0600) FiO2 (%):  [30 %-40 %] 30 % (03/22 0805) Weight:  [120.1 kg] 120.1 kg (03/22 0500) General: Sedated intubated HEENT: Normocephalic atraumatic Cardiovascular: Regular rhythm Respiratory: Vented Extremities: Trace edema Neurological exam Sedated intubated Propofol held for exam. Continue to have generalized tremulous movements-no EEG correlate for underlying seizure at the time. Pupils are pinpoint and barely reactive to light Does not follow any commands Mild withdrawal in all 4 extremities to noxious stimulation Breathing over the vent Positive cough and gag   Medications  Current Facility-Administered Medications:  .  0.9 %  sodium chloride infusion, 100 mL, Intravenous, PRN, Penninger, Lindsay, PA .  0.9 %  sodium chloride infusion, 100 mL, Intravenous, PRN, Penninger, Ria Comment, PA .  acetaminophen (TYLENOL) suppository 650 mg, 650 mg, Rectal, Once, Sherwood Gambler, MD .  alteplase (CATHFLO ACTIVASE) injection 2 mg, 2 mg, Intracatheter, Once PRN, Penninger, Ria Comment, PA .  ceFEPIme (MAXIPIME) 1 g in sodium chloride 0.9 % 100 mL IVPB, 1 g, Intravenous, Q24H, Ramaswamy, Murali, MD .  chlorhexidine gluconate (MEDLINE KIT) (PERIDEX) 0.12 % solution 15 mL, 15 mL, Mouth Rinse, BID, Ramaswamy, Murali, MD, 15 mL at 04/11/19 0751 .  Chlorhexidine Gluconate Cloth 2 %  PADS 6 each, 6 each, Topical, Q0600, Penninger, Lindsay, [place], 6 each at 04/11/19 0533 .  dextrose 10 % infusion, , Intravenous, Once, Sherwood Gambler, MD .  dextrose 10 % infusion, , Intravenous, Continuous, Stretch, Marily Lente, MD, Last Rate: 15 mL/hr at 04/11/19 0600, Rate Verify at 04/11/19 0600 .  dextrose 5 %-0.45 % sodium chloride infusion, , Intravenous, Continuous, Brand Males, MD, Stopped at 04/10/19 1933 .  feeding supplement (PRO-STAT SUGAR FREE 64) liquid 30 mL, 30 mL, Per Tube, BID, Brand Males, MD, 30 mL at 04/10/19 2246 .  feeding supplement (VITAL HIGH PROTEIN) liquid 1,000 mL, 1,000 mL, Per Tube, Q24H, Ramaswamy, Murali, MD .  feeding supplement (VITAL HIGH PROTEIN) liquid 1,000 mL, 1,000 mL, Per Tube, Q24H, Stretch, Marily Lente, MD, 1,000 mL at 04/10/19 2246 .  fentaNYL (SUBLIMAZE) injection 25 mcg, 25 mcg, Intravenous, Q15 min PRN, Chase Caller, Murali, MD .  fentaNYL (SUBLIMAZE) injection 25-100 mcg, 25-100 mcg, Intravenous, Q30 min PRN, Ramaswamy, Murali, MD .  fentaNYL 2527mg in NS 2572m(107mml) infusion-PREMIX, 0-200 mcg/hr, Intravenous, Continuous, Ogan, [place]or[place]wo U, MD, Last Rate: 7.5 mL/hr at 04/11/19 0600, 75 mcg/hr at 04/11/19 0600 .  heparin injection 1,000 Units, 1,000 Units, Dialysis, PRN, Penninger, Lindsay, PA .  heparin injection 5,000 Units, 5,000 Units, Subcutaneous, Q8H, Ramaswamy, Murali, MD, 5,000 Units at 04/11/19 0533 .  hydrALAZINE (APRESOLINE) injection 10-40 mg, 10-40 mg, Intravenous, Q4H PRN, RamChase Callerurali, MD .  insulin aspart (novoLOG) injection 1-3 Units, 1-3 Units, Subcutaneous, Q4H, RamBrand MalesD, 1 Units at 04/11/19 0410 .  ipratropium-albuterol (DUONEB) 0.5-2.5 (3) MG/3ML nebulizer  solution 3 mL, 3 mL, Nebulization, Q6H, Ramaswamy, Murali, MD, 3 mL at 04/11/19 0804 .  levETIRAcetam (KEPPRA) IVPB 500 mg/100 mL premix, 500 mg, Intravenous, Q12H, Aroor, Lanice Schwab, MD, Stopped at 04/11/19 0423 .  lidocaine (PF) (XYLOCAINE) 1 %  injection 5 mL, 5 mL, Intradermal, PRN, Penninger, Lindsay, PA .  lidocaine-prilocaine (EMLA) cream 1 application, 1 application, Topical, PRN, Penninger, Lindsay, PA .  MEDLINE mouth rinse, 15 mL, Mouth Rinse, 10 times per day, Brand Males, MD, 15 mL at 04/11/19 0535 .  midazolam (VERSED) injection 1 mg, 1 mg, Intravenous, Q15 min PRN, Brand Males, MD .  midazolam (VERSED) injection 1 mg, 1 mg, Intravenous, Q2H PRN, Brand Males, MD, 1 mg at 04/10/19 1550 .  nicardipine (CARDENE) 35m in 0.86% saline 2042mIV infusion (0.1 mg/ml), 0-15 mg/hr, Intravenous, Continuous, Ogan, OkKerry KassMD, Stopped at 04/10/19 0246 .  pantoprazole sodium (PROTONIX) 40 mg/20 mL oral suspension 40 mg, 40 mg, Per Tube, Daily, Ramaswamy, Murali, MD .  pentafluoroprop-tetrafluoroeth (GEBAUERS) aerosol 1 application, 1 application, Topical, PRN, Penninger, Lindsay, PA .  propofol (DIPRIVAN) 1000 MG/100ML infusion, 0-50 mcg/kg/min, Intravenous, Continuous, Ramaswamy, Murali, MD, Last Rate: 27.6 mL/hr at 04/11/19 0600, 40 mcg/kg/min at 04/11/19 0600 .  sodium chloride flush (NS) 0.9 % injection 10-40 mL, 10-40 mL, Intracatheter, Q12H, RaBrand MalesMD, 10 mL at 04/10/19 2302 .  sodium chloride flush (NS) 0.9 % injection 10-40 mL, 10-40 mL, Intracatheter, PRN, RaBrand MalesMD .  sodium zirconium cyclosilicate (LOKELMA) packet 10 g, 10 g, Oral, Once, RaBrand MalesMD Labs CBC    Component Value Date/Time   WBC 14.2 (H) 04/10/2019 1517   RBC 4.33 04/10/2019 1517   HGB 10.5 (L) 04/11/2019 0349   HCT 31.0 (L) 04/11/2019 0349   PLT 181 04/10/2019 1517   MCV 85.9 04/10/2019 1517   MCH 27.5 04/10/2019 1517   MCHC 32.0 04/10/2019 1517   RDW 19.2 (H) 04/10/2019 1517   LYMPHSABS 1.4 03/29/2019 2105   MONOABS 0.9 04/02/2019 2105   EOSABS 0.0 04/11/2019 2105   BASOSABS 0.0 03/28/2019 2105    CMP     Component Value Date/Time   NA 138 04/11/2019 0349   K 5.3 (H) 04/11/2019 0349   CL 100  04/10/2019 1517   CO2 22 04/10/2019 1517   GLUCOSE 96 04/10/2019 1517   BUN 93 (H) 04/10/2019 1517   CREATININE 11.73 (H) 04/10/2019 1517   CREATININE 2.86 (H) 10/10/2010 1140   CALCIUM 9.1 04/10/2019 1517   CALCIUM 8.7 01/08/2010 1121   PROT 7.4 03/24/2019 2105   ALBUMIN 2.7 (L) 04/10/2019 1517   AST 33 04/14/2019 2105   ALT 25 04/13/2019 2105   ALKPHOS 79 03/30/2019 2105   BILITOT 0.8 03/28/2019 2105   GFRNONAA 3 (L) 04/10/2019 1517   GFRAA 3 (L) 04/10/2019 1517   Markedly elevated BUN and creatinine. Ammonia and TSH normal   Imaging I have reviewed images in epic and the results pertinent to this consultation are: CT head on 04/16/2019 with right parietal scalp/soft tissue swelling.  No acute intracranial process.  Old cortical infarcts.  Assessment: [age]ear old woman found down-neurology consulted for possible encephalopathy versus unwitnessed seizure. Given old cortical infarcts, seizure is a possibility although she is sick with severely deranged renal function as well as pneumonia on her chest x-ray which can explain her current encephalopathy. Long-term EEG overnight negative for seizure activity-it did capture the tremulousness without any EEG correlate.  Impression: Toxic metabolic encephalopathy Seizures could be in the  differentials but she is not in status epilepticus and does not have any epileptiform activity.  Likely myoclonus-raises concern for underlying hypoxic/anoxic injury.  Recommendations: Discontinue LTM Continue Keppra Supportive care per primary team If the tremulousness continues, can consider adding a benzodiazepine such as Clonazepam or another AED such as Depakote . Correction of toxic metabolic derangements per primary team as well as nephrology-as you are. MRI brain without contrast when stable to assess for underlying structural abnormality and to assess for any evidence of hypoxic/anoxic injury.  We will follow the clinical exam with  you.   -- Amie Portland, MD [facility] Pager: [phone] If 7pm to 7am, please call on call as listed on AMION.  CRITICAL CARE ATTESTATION Performed by: Amie Portland, MD Total critical care time: 35 minutes Critical care time was exclusive of separately billable procedures and treating other patients and/or supervising APPs/Residents/Students Critical care was necessary to treat or prevent imminent or life-threatening deterioration due to toxic metabolic encephalopathy, possible seizures This patient is critically ill and at significant risk for neurological worsening and/or death and care requires constant monitoring. Critical care was time spent personally by me on the following activities: development of treatment plan with patient and/or surrogate as well as nursing, discussions with consultants, evaluation of patient's response to treatment, examination of patient, obtaining history from patient or surrogate, ordering and performing treatments and interventions, ordering and review of laboratory studies, ordering and review of radiographic studies, pulse oximetry, re-evaluation of patient's condition, participation in multidisciplinary rounds and medical decision making of high complexity in the care of this patient.

## 2019-04-11 NOTE — Progress Notes (Signed)
LTM EEG disconnected - mild skin breakdown at Fp1 and Fp2

## 2019-04-11 NOTE — Progress Notes (Signed)
Assisted tele visit to patient with family member.  Domonick Sittner P, RN  

## 2019-04-11 NOTE — Progress Notes (Signed)
PCCM progress note  Patient is in rapid heart rate up to 150s Atrial flutter on EKG  Blood pressure (!) 105/53, pulse (!) 152, temperature 98.8 F (37.1 C), temperature source Axillary, resp. rate 16, weight 120.1 kg, SpO2 95 %. Gen:      No acute distress, obese HEENT:  EOMI, sclera anicteric Neck:     No masses; no thyromegaly Lungs:    Clear to auscultation bilaterally; normal respiratory effort CV:         Regular rate and rhythm; no murmurs Abd:      + bowel sounds; soft, non-tender; no palpable masses, no distension Ext:    No edema; adequate peripheral perfusion Skin:      Warm and dry; no rash Neuro: sedated  Assessment and plan New onset atrial fibrillation 4 mg Lopressor.  If not effective then amnio bolus and drip.   The patient is critically ill with multiple organ system failure and requires high complexity decision making for assessment and support, frequent evaluation and titration of therapies, advanced monitoring, review of radiographic studies and interpretation of complex data.   Critical Care Time devoted to patient care services, exclusive of separately billable procedures, described in this note is 35 minutes.   Marshell Garfinkel MD [facility] Please see [email or web address] for pager details.  04/11/2019, 4:20 PM

## 2019-04-11 NOTE — Progress Notes (Signed)
Assisted tele visit to patient with family member.  Lemoyne Scarpati P, RN  

## 2019-04-11 NOTE — Progress Notes (Signed)
NAME:  [name], MRN:  [id number], DOB:  [birth year], LOS: 1 ADMISSION DATE:  04/08/2019, CONSULTATION DATE:  03/24/2019  REFERRING MD: Dr. Ardith Dark of the ER, CHIEF COMPLAINT: Acute encephalopathy  Brief History   9 yobf remote smoker with found down pm 3/20  and subsequent intubation on arrival  in the ER and pccm team asked to admit    Past Medical History  ESRF, OSA on CPAP, AODM,CHF,AB/ nl pfts 10/2016        Significant Hospital Events   03/02/2019: ER visit for arthritic type knee pain and history of fall with generalized weakness.  Right knee was swollen and warm.  Treated with as needed hydrocodone 03/30/2019: ER visit for generalized weakness, dizziness and vertigo following dialysis.Marland Kitchen  MRI negative. 04/05/2019: Hypoglycemia after dialysis.  Brought in confused and disoriented to the ER 04/06/2019: Unresponsive intubated admitted via the ER 3/22: Ongoing tremors with stimulation, unresponsive, not following commands Consults:  04/18/2019 - Neuro  3/21  Renal   Procedures:  03/25/2019 Renaldo Harrison;  ETT in ER 03/29/2019 - Left IJ in ER 04/10/19 -  R fem art line   Significant Diagnostic Tests:   MR Brain w/o contrast 3/20: 1. No acute intracranial abnormality. 2. Diffuse soft tissue swelling throughout the right frontoparietal and temporal scalp. Query recent trauma. 3. 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 4. Underlying age-related cerebral atrophy with moderate chronic microvascular ischemic disease, with multiple additional remote lacunar infarcts as above. 5. Diffuse mucosal thickening with scattered air-fluid levels seen throughout the paranasal sinuses. EEG am 3/21 :  gen slowing but  no sz   Micro/ID  Data:  Covid 19  3/20 neg  MRSA  3/21 : neg  Blood 3/20 ordered but  not done prior to abx Respiratory culture 3/20 >>> Antimicrobials:  vanc 04/05/2019  X 2 gm > d/c'd 3/21 since mrsa screen neg (renal pt anyway so will linger) Cefepime  03/27/2019 >>>     Scheduled Meds: . acetaminophen  650 mg Rectal Once  . chlorhexidine gluconate (MEDLINE KIT)  15 mL Mouth Rinse BID  . Chlorhexidine Gluconate Cloth  6 each Topical Daily  . heparin  5,000 Units Subcutaneous Q8H  . insulin aspart  1-3 Units Subcutaneous Q4H  . ipratropium-albuterol  3 mL Nebulization Q6H  . mouth rinse  15 mL Mouth Rinse 10 times per day  . pantoprazole sodium  40 mg Per Tube Daily  . sodium chloride flush  10-40 mL Intracatheter Q12H  . sodium zirconium cyclosilicate  10 g Oral Once   Continuous Infusions: . [START ON 04/12/2019] ceFEPime (MAXIPIME) IV    . dextrose    . dextrose 5 % and 0.45% NaCl 10 mL/hr at 04/10/19 0600  . fentaNYL infusion INTRAVENOUS 25 mcg/hr (04/10/19 0600)  . levETIRAcetam Stopped (04/10/19 0525)  . niCARDipine Stopped (04/10/19 0246)  . propofol (DIPRIVAN) infusion 20 mcg/kg/min (04/10/19 0600)  . [START ON 04/12/2019] vancomycin     PRN Meds:.sodium chloride, sodium chloride, alteplase, fentaNYL (SUBLIMAZE) injection, fentaNYL (SUBLIMAZE) injection, heparin, hydrALAZINE, lidocaine (PF), lidocaine-prilocaine, midazolam, midazolam, pentafluoroprop-tetrafluoroeth, sodium chloride flush   Interim history/subjective:  Becomes tremulous anytime she is stimulated Objective   Blood pressure (Abnormal) 103/37, pulse 75, temperature 98.9 F (37.2 C), temperature source Axillary, resp. rate 16, weight 120.1 kg, SpO2 99 %.    Vent Mode: PRVC FiO2 (%):  [30 %-40 %] 30 % Set Rate:  [16 bmp] 16 bmp Vt Set:  [500 mL] 500 mL PEEP:  [5  Kiefer Pressure:  [19 cmH20-22 cmH20] 19 cmH20   Intake/Output Summary (Last 24 hours) at 04/11/2019 2620 Last data filed at 04/11/2019 0600 Gross per 24 hour  Intake 1535.27 ml  Output no documentation  Net 1535.27 ml   Filed Weights   04/11/19 0500  Weight: 120.1 kg    Examination:  General this is an obese 74 year old acutely ill appearing black female she remains  unresponsive however becomes quite tremulous with minimal physical stimulation HEENT: Orally intubated, neck swollen, tongue appears somewhat edematous, copious oral secretions, pupils equal reactive Pulmonary: Scattered rhonchi, accessory use noted when stimulated Cardiac: Regular rate and rhythm Abdomen obese Extremities diffuse anasarca Neuro unresponsive, tremulous when stimulated but not purposeful GU anuric   Resolved Hospital Problem list    Comatose. Etiology unclear, several potential metabolic inciting factors such as: Sepsis, hypertension, hypoglycemia, renal failure.  MRI showed diffuse soft tissue swelling throughout the right frontoparietal and temporal scalp raising concern for trauma chronic right parietal left occipital infarcts atrophy moderate chronic micro vascular ischemic disease plan Blood pressure goal 140-1 60 Serial neuro checks Correct metabolic derangements EEG per neurology Continue Keppra  Acute Respiratory Failure due to acute encephalopathy - intubated in ER - pfts 2018 s copd or any airflow obst Portable chest x-ray personally reviewed: This demonstrates right greater than left basilar airspace disease.  The endotracheal tube and left IJ triple-lumen catheter in satisfactory position.  Comparing film from 3/20 aeration looks improved Plan Cont full vent support PAD protocol, RASS goal -2 VAP bundle A.m. chest x-ray  Fever leukocytosis, suspect aspiration pneumonia Fever curve improved since yesterday Plan Follow-up sputum culture Continuing empiric antibiotics, day #2 cefepime, did receive 1 doses vancomycin on 3/20   Known C-Diast CHF (normal cath 2019) Mild MS and AS on echo May 2020 Hypertension, had mild troponin elevation Blood pressure better controlled following dialysis Plan As needed hydralazine Dialysis Goal blood pressure 140-160   ESRD on TTS HD Plan Per nephrology  Fluid and electrolyte imbalance: Hyperkalemia Hypocalcemia,  secondary hyperparathyroidism  plan Continue to trend electrolytes  Received dialysis on 3/21, anticipate will require this again today on 3/22   DM - hypoglycemic ER visit in march 2021, excellent glycemic control while here Plan Every 4 hour Accu-Cheks with sliding scale coverage ordered  Enlarged Thyroid left lobe on admit CT 03/28/2019 Plan This will necessitate close observation at time of extubation, specifically checking for airway leak test Consider biopsy while in hospital if survives acute illness  At risk sacral decub Plan Maximize nutrition Pressure relief interventions Frequent turning     Best practice:  Diet: npo.  Tube feeds Pain/Anxiety/Delirium protocol (if indicated): diprivan drip  and fent  prn VAP protocol (if indicated): HOB > 30 DVT prophylaxis: heparin sq GI prophylaxis: ppi Glucose control: ssi Mobility: bed rest Code Status: full code Family Communication: daughter and cousing outside ER updated pm 3/20  Disposition: ICU  My critical care time 32 minutes  Erick Colace ACNP-BC [place] Pager # [phone] OR # [phone] if no answer

## 2019-04-11 NOTE — Progress Notes (Signed)
LTM EEG discontinued - skin breakdown at [facility], FP1,FP2.

## 2019-04-11 NOTE — Procedures (Addendum)
Patient Name: [name]  MRN: [id number]  Epilepsy Attending: Lora Havens  Referring Physician/Provider: Dr Trevor Mace Duration: 04/10/2019 0953 to 04/11/2019 1005 Total Duration of study: 24 hours  Patient history: 74yo F who was found down. EEG to evaluate for ams, seizure.   Level of alertness: comatose  AEDs during EEG study: propofol, keppra  Technical aspects: This EEG study was done with scalp electrodes positioned according to the 10-20 International system of electrode placement. Electrical activity was acquired at a sampling rate of 500Hz  and reviewed with a high frequency filter of 70Hz  and a low frequency filter of 1Hz . EEG data were recorded continuously and digitally stored.   DESCRIPTION:  EEG showed continuous generalized 2-3Hz  low amplitude delta slowing. EEG was reactive to tactile stimulation. Hyperventilation and photic stimulation were not performed  Event button was pressed on 04/10/2019 at 1545 and 04/11/2019 0819 for axial stiffening. Concomitant EEG before, during and after the event didn't show any eeg change to suggest seizure.       IMPRESSION: This study is suggestive of moderate to severe diffuse encephalopathy, non specific to etiology. No seizures or epileptiform discharges were seen throughout the recording.  Event button was pressed twice as described above for episodes of axial stiffening without eeg change and were likely not epileptic.

## 2019-04-11 NOTE — Progress Notes (Signed)
HD tx stopped with 13 min left; HR noted at 171, called the floor RN to the pt's room. HR>145; took pt out off UF not improvement; tx stopped. Floor RN in Room with pt; EKG taken and Dr. Marshell Garfinkel, MD notified by Floor RN.

## 2019-04-11 NOTE — Progress Notes (Signed)
[facility] Progress Note  Subjective: did not get HD last night, K+ down this am  Vitals:   04/11/19 1000 04/11/19 1100 04/11/19 1200 04/11/19 1212  BP: (!) 73/30 (!) 79/34 (!) 84/52 (!) 84/52  Pulse: 87 89 90 88  Resp: (!) 23 (!) '21 18 16  ' Temp:   98.8 F (37.1 C)   TempSrc:   Axillary   SpO2: 97% 96% 97% 96%  Weight:        Exam: General: obese female, intubated and sedated Head: NCAT sclera not icteric  Neck: Supple.  Lungs: intubated. CTAB anteriorly  Heart: RRR. +2/6 murmur, no rubs or gallops.  Abdomen: soft, nontender, +BS, no guarding, no rebound tenderness Lower extremities:no edema, ischemic changes, or open wounds  Neuro: sedated Dialysis Access:LU AVF +b/t  Dialysis Orders:  [facility] - [facility]   4.5h  450/1.5  112kg   2/2 bath  P4  LUA AVF  Hep 13000   Mircera 150 mcg q2wks - last 3/6 Venofer 27m qwk Sensipar [id number] Calcitriol 1.75 mcg PO qHD   Assessment/Plan: 1. Acute obtunded encephalopathy - etiology unclear, pt was found down. Neuro and critical care following.  2. Acute respiratory failure - intubated. PNA on CXR.  3. Hyperkalemia - K 5's today, plan HD today.  4.  ESRD -  On HD TTS. Missed last HD on Sat. Plan short HD today and regular HD tomorrow on schedule.  5.  Hypertension/volume  - Blood pressures by a-line are normal. Up 7-8kg by wts. Looks swollen. Try UF 2-3  w/ HD today and tomorrow.  6.  Anemia of CKD - Hgb 12.6. No indication for ESA at this time.  7.  Secondary Hyperparathyroidism -  Ca in goal.  Phos elevated. Will restart sensipar, binder & VDRA with tolerating PO.  8.  Nutrition - Renal diet w/fluid restrictions.  9. DMT2 10. OSA     Rob Lc Joynt 04/11/2019, 1:27 PM   Recent Labs  Lab 04/10/19 0525 04/10/19 1122 04/10/19 1517 04/11/19 0349 04/11/19 0353  K 5.5*   < > 6.1* 5.3*  --   BUN 86*  --  93*  --   --   CREATININE 10.92*  --  11.73*  --   --   CALCIUM 9.0  --  9.1  --   --   PHOS 6.6*  --  6.8*   7.1*  --  7.6*  HGB 12.6   < > 11.9* 10.5*  --    < > = values in this interval not displayed.   Inpatient medications: . acetaminophen  650 mg Rectal Once  . chlorhexidine gluconate (MEDLINE KIT)  15 mL Mouth Rinse BID  . Chlorhexidine Gluconate Cloth  6 each Topical Q0600  . feeding supplement (PRO-STAT SUGAR FREE 64)  30 mL Per Tube BID  . feeding supplement (VITAL HIGH PROTEIN)  1,000 mL Per Tube Q24H  . feeding supplement (VITAL HIGH PROTEIN)  1,000 mL Per Tube Q24H  . heparin  5,000 Units Subcutaneous Q8H  . ipratropium-albuterol  3 mL Nebulization Q6H  . mouth rinse  15 mL Mouth Rinse 10 times per day  . pantoprazole sodium  40 mg Per Tube Daily  . sodium chloride flush  10-40 mL Intracatheter Q12H  . sodium zirconium cyclosilicate  10 g Oral Once   . sodium chloride    . sodium chloride    . ceFEPime (MAXIPIME) IV    . dextrose 5 % and 0.45% NaCl Stopped (04/10/19  1933)  . fentaNYL infusion INTRAVENOUS 50 mcg/hr (04/11/19 1100)  . levETIRAcetam Stopped (04/11/19 0423)  . niCARDipine Stopped (04/10/19 0246)  . propofol (DIPRIVAN) infusion 40 mcg/kg/min (04/11/19 1251)   sodium chloride, sodium chloride, alteplase, fentaNYL (SUBLIMAZE) injection, fentaNYL (SUBLIMAZE) injection, heparin, hydrALAZINE, lidocaine (PF), lidocaine-prilocaine, midazolam, midazolam, pentafluoroprop-tetrafluoroeth, sodium chloride flush

## 2019-04-11 NOTE — Progress Notes (Signed)
Initial Nutrition Assessment  DOCUMENTATION CODES:   Morbid obesity  INTERVENTION:   Vital High Protein @ 20 ml/hr (480 ml/day) via OG tube 60 ml Prostat TID  Provides: 1080 kcal, 132 grams protein, and 401 ml free water.   TF regimen and propofol at current rate providing 1792 total kcal/day   NUTRITION DIAGNOSIS:   Inadequate oral intake related to inability to eat as evidenced by NPO status.  GOAL:   Provide needs based on ASPEN/SCCM guidelines  MONITOR:   TF tolerance, Vent status  REASON FOR ASSESSMENT:   Consult, Ventilator Enteral/tube feeding initiation and management  ASSESSMENT:   Pt with PMH of ESRD on HD TTS, OSA on CPAP, DM, CHF, remote smoker who was found down.  Per MD tissue swelling in R frontoparietal and temporal scalp concern for trauma chronic R parietal L occipital infarcts.   Per renal short HD session today and repeat HD tomorrow.   Patient is currently intubated on ventilator support MV: 9.4 L/min Temp (24hrs), Avg:99.3 F (37.4 C), Min:98.8 F (37.1 C), Max:101.1 F (38.4 C)  Propofol: 27.6 ml/hr provides 712 kcal  Medications reviewed and include: lokelma Labs reviewed: K+ 5.3 (H) PO4: 7.6 (H) TG: 202 (H) CBG's: 103-120-86 Net UF: 1697 ml    NUTRITION - FOCUSED PHYSICAL EXAM:    Most Recent Value  Orbital Region  No depletion  Upper Arm Region  No depletion  Thoracic and Lumbar Region  No depletion  Buccal Region  Unable to assess  Temple Region  No depletion  Clavicle Bone Region  No depletion  Clavicle and Acromion Bone Region  No depletion  Scapular Bone Region  Unable to assess  Dorsal Hand  No depletion  Patellar Region  No depletion  Anterior Thigh Region  No depletion  Posterior Calf Region  No depletion  Edema (RD Assessment)  Mild  Hair  Reviewed  Eyes  Unable to assess  Mouth  Unable to assess  Skin  Reviewed  Nails  Reviewed       Diet Order:   Diet Order            Diet NPO time specified  Diet  effective now              EDUCATION NEEDS:   No education needs have been identified at this time  Skin:  Skin Assessment: Reviewed RN Assessment  Last BM:  3/21  Height:   Ht Readings from Last 1 Encounters:  04/05/19 5\' 7"  (1.702 m)    Weight:   Wt Readings from Last 1 Encounters:  04/11/19 120.1 kg    Ideal Body Weight:  61.3 kg  BMI:  Body mass index is 41.47 kg/m.  Estimated Nutritional Needs:   Kcal:  1300-1700  Protein:  122-153 grams  Fluid:  >1.5 L/day  Lockie Pares., RD, LDN, CNSC See AMiON for contact information

## 2019-04-11 NOTE — Progress Notes (Signed)
Cortrak Tube Team Note:  Consult received to place a Cortrak feeding tube.   Patient is currently intubated with OGT in place and is receiving TF via OGT. Able to talk with RN at bedside who communicated with CCM. Discontinue Cortrak order at this time and maintain OGT for meds and TF.     Jarome Matin, MS, RD, LDN, CNSC Inpatient Clinical Dietitian RD pager # available in AMION  After hours/weekend pager # available in [facility]

## 2019-04-12 ENCOUNTER — Inpatient Hospital Stay (HOSPITAL_COMMUNITY): Payer: Medicare Other

## 2019-04-12 DIAGNOSIS — I361 Nonrheumatic tricuspid (valve) insufficiency: Secondary | ICD-10-CM

## 2019-04-12 DIAGNOSIS — I352 Nonrheumatic aortic (valve) stenosis with insufficiency: Secondary | ICD-10-CM

## 2019-04-12 LAB — CBC
HCT: 29.5 % — ABNORMAL LOW (ref 36.0–46.0)
Hemoglobin: 9.5 g/dL — ABNORMAL LOW (ref 12.0–15.0)
MCH: 27.9 pg (ref 26.0–34.0)
MCHC: 32.2 g/dL (ref 30.0–36.0)
MCV: 86.5 fL (ref 80.0–100.0)
Platelets: 142 10*3/uL — ABNORMAL LOW (ref 150–400)
RBC: 3.41 MIL/uL — ABNORMAL LOW (ref 3.87–5.11)
RDW: 19.1 % — ABNORMAL HIGH (ref 11.5–15.5)
WBC: 7.7 10*3/uL (ref 4.0–10.5)
nRBC: 0 % (ref 0.0–0.2)

## 2019-04-12 LAB — COMPREHENSIVE METABOLIC PANEL
ALT: 15 U/L (ref 0–44)
AST: 15 U/L (ref 15–41)
Albumin: 2.3 g/dL — ABNORMAL LOW (ref 3.5–5.0)
Alkaline Phosphatase: 43 U/L (ref 38–126)
Anion gap: 13 (ref 5–15)
BUN: 83 mg/dL — ABNORMAL HIGH (ref 8–23)
CO2: 25 mmol/L (ref 22–32)
Calcium: 8.4 mg/dL — ABNORMAL LOW (ref 8.9–10.3)
Chloride: 100 mmol/L (ref 98–111)
Creatinine, Ser: 9.82 mg/dL — ABNORMAL HIGH (ref 0.44–1.00)
GFR calc Af Amer: 4 mL/min — ABNORMAL LOW (ref >60)
GFR calc non Af Amer: 3 mL/min — ABNORMAL LOW (ref >60)
Glucose, Bld: 125 mg/dL — ABNORMAL HIGH (ref 70–99)
Potassium: 5.1 mmol/L (ref 3.5–5.1)
Sodium: 138 mmol/L (ref 135–145)
Total Bilirubin: 0.8 mg/dL (ref 0.3–1.2)
Total Protein: 5.8 g/dL — ABNORMAL LOW (ref 6.5–8.1)

## 2019-04-12 LAB — GLUCOSE, CAPILLARY
Glucose-Capillary: 103 mg/dL — ABNORMAL HIGH (ref 70–99)
Glucose-Capillary: 107 mg/dL — ABNORMAL HIGH (ref 70–99)
Glucose-Capillary: 107 mg/dL — ABNORMAL HIGH (ref 70–99)
Glucose-Capillary: 131 mg/dL — ABNORMAL HIGH (ref 70–99)
Glucose-Capillary: 91 mg/dL (ref 70–99)
Glucose-Capillary: 95 mg/dL (ref 70–99)

## 2019-04-12 LAB — PROTIME-INR
INR: 1.1 (ref 0.8–1.2)
Prothrombin Time: 14.2 seconds (ref 11.4–15.2)

## 2019-04-12 LAB — MAGNESIUM
Magnesium: 2.2 mg/dL (ref 1.7–2.4)
Magnesium: 2 mg/dL (ref 1.7–2.4)

## 2019-04-12 LAB — PHOSPHORUS
Phosphorus: 7.3 mg/dL — ABNORMAL HIGH (ref 2.5–4.6)
Phosphorus: 6 mg/dL — ABNORMAL HIGH (ref 2.5–4.6)

## 2019-04-12 LAB — TRIGLYCERIDES: Triglycerides: 127 mg/dL (ref <150)

## 2019-04-12 LAB — CULTURE, RESPIRATORY W GRAM STAIN

## 2019-04-12 MED ORDER — CEFAZOLIN SODIUM-DEXTROSE 1-4 GM/50ML-% IV SOLN
1.0000 g | INTRAVENOUS | Status: AC
  Administered 2019-04-12 – 2019-04-15 (×4): 1 g via INTRAVENOUS
  Filled 2019-04-12 (×4): qty 50

## 2019-04-12 MED ORDER — FENTANYL CITRATE (PF) 100 MCG/2ML IJ SOLN
50.0000 ug | INTRAMUSCULAR | Status: DC | PRN
  Administered 2019-04-13 – 2019-04-14 (×2): 100 ug via INTRAVENOUS
  Administered 2019-04-15 (×3): 200 ug via INTRAVENOUS
  Administered 2019-04-15: 100 ug via INTRAVENOUS
  Administered 2019-04-16 (×2): 200 ug via INTRAVENOUS
  Administered 2019-04-16 (×2): 100 ug via INTRAVENOUS
  Administered 2019-04-16: 200 ug via INTRAVENOUS
  Administered 2019-04-16: 100 ug via INTRAVENOUS
  Filled 2019-04-12: qty 2
  Filled 2019-04-12: qty 4
  Filled 2019-04-12: qty 2
  Filled 2019-04-12: qty 4
  Filled 2019-04-12: qty 2
  Filled 2019-04-12: qty 4
  Filled 2019-04-12 (×2): qty 2
  Filled 2019-04-12: qty 4
  Filled 2019-04-12: qty 2
  Filled 2019-04-12 (×2): qty 4

## 2019-04-12 MED ORDER — HEPARIN SODIUM (PORCINE) 1000 UNIT/ML IJ SOLN
INTRAMUSCULAR | Status: AC
  Filled 2019-04-12: qty 4

## 2019-04-12 NOTE — Progress Notes (Signed)
Pharmacy Antibiotic Note  [name] is a 74 y.o. female admitted on 04/20/2019 with pneumonia.  Pharmacy has been consulted for cefazolin dosing.  This patient was started empirically on vancomycin and cefepime. After MRSA PCR resulted as negative, vancomycin was discontinued. Respiratory culture from 3/21 growing MSSA. This patient has been transitioned from cefepime to cefazolin today.   WBC down to 7.7 from 14.2. Temperature is also down to 97.9 from 101.1 F. This patient is received HD on 3/22 and again today (3/23) for 3.5 hours.   Plan: - Discontinue cefepime  - Start cefazolin 1g IV q24hr - F/u HD schedule and plans for LOT  Weight: 269 lb 2.9 oz (122.1 kg)  Temp (24hrs), Avg:98.5 F (36.9 C), Min:97.7 F (36.5 C), Max:99.2 F (37.3 C)  Recent Labs  Lab 04/05/19 1250 04/05/19 1250 04/20/2019 2105 03/30/2019 2109 04/10/19 0525 04/10/19 1517 04/11/19 0353 04/12/19 0413  WBC 5.9  --  10.4  --  13.0* 14.2*  --  7.7  CREATININE 6.22*   < > 10.67* 11.50* 10.92* 11.73*  --  9.82*  LATICACIDVEN  --   --   --   --  1.1  --  0.8  --    < > = values in this interval not displayed.    Estimated Creatinine Clearance: 6.7 mL/min (A) (by C-G formula based on SCr of 9.82 mg/dL (H)).    Allergies  Allergen Reactions  . Adhesive [Tape] Rash  . Lyrica [Pregabalin] Other (See Comments)    Hallucinations    Antimicrobials this admission: Vancomycin 3/21 x1 Cefepime 3/21 >> 3/22 Cefazolin 3/23 >>  Microbiology results: 3/21 BCx: NG x2 days 3/21 Sputum: MSSA  3/21 MRSA PCR: Negative  Thank you for allowing pharmacy to be a part of this patient's care.  Agnes Lawrence, PharmD PGY1 Pharmacy Resident

## 2019-04-12 NOTE — Progress Notes (Addendum)
Neurology Progress Note   S:// Seen and examined.  LTM d/c yesterday   O:// Current vital signs: BP (!) 142/92 (BP Location: Left Leg)   Pulse (!) 107   Temp 97.7 F (36.5 C) (Axillary)   Resp (!) 22   Wt 122.1 kg   SpO2 98%   BMI 42.16 kg/m  Vital signs in last 24 hours: Temp:  [97.7 F (36.5 C)-99.2 F (37.3 C)] 97.7 F (36.5 C) (03/23 0745) Pulse Rate:  [73-152] 107 (03/23 0815) Resp:  [16-28] 22 (03/23 0759) BP: (72-161)/(30-130) 142/92 (03/23 0815) SpO2:  [95 %-100 %] 98 % (03/23 0759) Arterial Line BP: (92-194)/(41-91) 165/60 (03/23 0745) FiO2 (%):  [30 %] 30 % (03/23 0817) Weight:  [119.1 kg-122.1 kg] 122.1 kg (03/23 0715) Unchanged exam General: Sedated intubated HEENT: Normocephalic atraumatic Cardiovascular: Regular rhythm Respiratory: Vented Extremities: Trace edema Neurological exam Sedated intubated Propofol held for exam. Opens eyes to voice Pupils are pinpoint and barely reactive to light Does not follow any commands Mild withdrawal in all 4 extremities to noxious stimulation Breathing over the vent Positive cough and gag   Medications  Current Facility-Administered Medications:  .  0.9 %  sodium chloride infusion, 100 mL, Intravenous, PRN, Penninger, Lindsay, PA .  0.9 %  sodium chloride infusion, 100 mL, Intravenous, PRN, Penninger, Ria Comment, PA .  acetaminophen (TYLENOL) suppository 650 mg, 650 mg, Rectal, Once, Sherwood Gambler, MD .  alteplase (CATHFLO ACTIVASE) injection 2 mg, 2 mg, Intracatheter, Once PRN, Penninger, Ria Comment, PA .  ceFEPIme (MAXIPIME) 1 g in sodium chloride 0.9 % 100 mL IVPB, 1 g, Intravenous, Q24H, Brand Males, MD, Stopped at 04/11/19 1857 .  chlorhexidine gluconate (MEDLINE KIT) (PERIDEX) 0.12 % solution 15 mL, 15 mL, Mouth Rinse, BID, Ramaswamy, Murali, MD, 15 mL at 04/11/19 1928 .  Chlorhexidine Gluconate Cloth 2 % PADS 6 each, 6 each, Topical, Q0600, Penninger, Lindsay, [place], 6 each at 04/11/19 0533 .   Chlorhexidine Gluconate Cloth 2 % PADS 6 each, 6 each, Topical, Q0600, Roney Jaffe, MD, 6 each at 04/12/19 0503 .  feeding supplement (PRO-STAT SUGAR FREE 64) liquid 60 mL, 60 mL, Per Tube, TID, Tanda Rockers, MD, 60 mL at 04/11/19 2154 .  feeding supplement (VITAL HIGH PROTEIN) liquid 1,000 mL, 1,000 mL, Per Tube, Q24H, Tanda Rockers, MD, 1,000 mL at 04/12/19 0410 .  fentaNYL (SUBLIMAZE) injection 25 mcg, 25 mcg, Intravenous, Q15 min PRN, Chase Caller, Murali, MD .  fentaNYL (SUBLIMAZE) injection 25-100 mcg, 25-100 mcg, Intravenous, Q30 min PRN, Chase Caller, Murali, MD .  fentaNYL 2510mg in NS 2525m(1026mml) infusion-PREMIX, 0-200 mcg/hr, Intravenous, Continuous, Ogan, [place]wo U, MD, Stopped at 04/11/19 1616 .  heparin 1000 UNIT/ML injection, , , ,  .  heparin injection 1,000 Units, 1,000 Units, Dialysis, PRN, Penninger, Lindsay, PA .  heparin injection 5,000 Units, 5,000 Units, Subcutaneous, Q8H, RamBrand MalesD, 5,000 Units at 04/12/19 0502 .  hydrALAZINE (APRESOLINE) injection 10-40 mg, 10-40 mg, Intravenous, Q4H PRN, RamBrand MalesD, 40 mg at 04/11/19 0834 .  ipratropium-albuterol (DUONEB) 0.5-2.5 (3) MG/3ML nebulizer solution 3 mL, 3 mL, Nebulization, Q6H, Ramaswamy, Murali, MD, 3 mL at 04/12/19 0758 .  levETIRAcetam (KEPPRA) IVPB 500 mg/100 mL premix, 500 mg, Intravenous, Q12H, Aroor, SusLanice SchwabD, Stopped at 04/12/19 0424 .  lidocaine (PF) (XYLOCAINE) 1 % injection 5 mL, 5 mL, Intradermal, PRN, Penninger, Lindsay, PA .  lidocaine-prilocaine (EMLA) cream 1 application, 1 application, Topical, PRN, Penninger, Lindsay, PA .  MEDLINE mouth rinse, 15 mL, Mouth Rinse,  10 times per day, Brand Males, MD, 15 mL at 04/12/19 0504 .  midazolam (VERSED) injection 1 mg, 1 mg, Intravenous, Q15 min PRN, Brand Males, MD .  midazolam (VERSED) injection 1 mg, 1 mg, Intravenous, Q2H PRN, Brand Males, MD, 1 mg at 04/10/19 1550 .  nicardipine (CARDENE) 48m in 0.86% saline  2063mIV infusion (0.1 mg/ml), 0-15 mg/hr, Intravenous, Continuous, Ogan, OkKerry KassMD, Stopped at 04/10/19 0246 .  pantoprazole sodium (PROTONIX) 40 mg/20 mL oral suspension 40 mg, 40 mg, Per Tube, Daily, Ramaswamy, Murali, MD, 40 mg at 04/11/19 1003 .  pentafluoroprop-tetrafluoroeth (GEBAUERS) aerosol 1 application, 1 application, Topical, PRN, Penninger, Lindsay, PA .  propofol (DIPRIVAN) 1000 MG/100ML infusion, 0-50 mcg/kg/min, Intravenous, Continuous, Ramaswamy, Murali, MD, Last Rate: 20.7 mL/hr at 04/12/19 0600, 30 mcg/kg/min at 04/12/19 0600 .  sodium chloride flush (NS) 0.9 % injection 10-40 mL, 10-40 mL, Intracatheter, Q12H, RaBrand MalesMD, 10 mL at 04/11/19 2156 .  sodium chloride flush (NS) 0.9 % injection 10-40 mL, 10-40 mL, Intracatheter, PRN, RaChase CallerMurali, MD .  sodium zirconium cyclosilicate (LOKELMA) packet 10 g, 10 g, Oral, Once, RaBrand MalesMD Labs CBC    Component Value Date/Time   WBC 7.7 04/12/2019 0413   RBC 3.41 (L) 04/12/2019 0413   HGB 9.5 (L) 04/12/2019 0413   HCT 29.5 (L) 04/12/2019 0413   PLT 142 (L) 04/12/2019 0413   MCV 86.5 04/12/2019 0413   MCH 27.9 04/12/2019 0413   MCHC 32.2 04/12/2019 0413   RDW 19.1 (H) 04/12/2019 0413   LYMPHSABS 1.4 03/27/2019 2105   MONOABS 0.9 04/08/2019 2105   EOSABS 0.0 04/04/2019 2105   BASOSABS 0.0 04/04/2019 2105    CMP     Component Value Date/Time   NA 138 04/12/2019 0413   K 5.1 04/12/2019 0413   CL 100 04/12/2019 0413   CO2 25 04/12/2019 0413   GLUCOSE 125 (H) 04/12/2019 0413   BUN 83 (H) 04/12/2019 0413   CREATININE 9.82 (H) 04/12/2019 0413   CREATININE 2.86 (H) 10/10/2010 1140   CALCIUM 8.4 (L) 04/12/2019 0413   CALCIUM 8.7 01/08/2010 1121   PROT 5.8 (L) 04/12/2019 0413   ALBUMIN 2.3 (L) 04/12/2019 0413   AST 15 04/12/2019 0413   ALT 15 04/12/2019 0413   ALKPHOS 43 04/12/2019 0413   BILITOT 0.8 04/12/2019 0413   GFRNONAA 3 (L) 04/12/2019 0413   GFRAA 4 (L) 04/12/2019 0413    Markedly elevated BUN and creatinine. Ammonia and TSH normal   Imaging I have reviewed images in epic and the results pertinent to this consultation are: CT head on 03/29/2019 with right parietal scalp/soft tissue swelling.  No acute intracranial process.  Old cortical infarcts.  Assessment: [age]ear old woman found down-neurology consulted for possible encephalopathy versus unwitnessed seizure. Given old cortical infarcts, seizure is a possibility although she is sick with severely deranged renal function as well as pneumonia on her chest x-ray which can explain her current encephalopathy. Long-term EEG overnight negative for seizure activity-it did capture the tremulousness without any EEG correlate. Was discontinued yesterday after formal read  Impression: Toxic metabolic encephalopathy Seizures could be in the differentials but she is not in status epilepticus and does not have any epileptiform activity.  Likely myoclonus-raises concern for underlying hypoxic/anoxic injury.  Recommendations: Continue Keppra Supportive care per primary team If the tremulousness continues, can consider adding a benzodiazepine such as Clonazepam or another AED such as Depakote . Correction of toxic metabolic derangements per primary team as well as  nephrology-as you are.  We will follow the clinical exam with you.   -- Amie Portland, MD [facility] Pager: [phone] If 7pm to 7am, please call on call as listed on AMION.  CRITICAL CARE ATTESTATION Performed by: Amie Portland, MD Total critical care time: 30 minutes Critical care time was exclusive of separately billable procedures and treating other patients and/or supervising APPs/Residents/Students Critical care was necessary to treat or prevent imminent or life-threatening deterioration due to toxic metabolic encephalopathy, possible seizures This patient is critically ill and at significant risk for neurological worsening and/or  death and care requires constant monitoring. Critical care was time spent personally by me on the following activities: development of treatment plan with patient and/or surrogate as well as nursing, discussions with consultants, evaluation of patient's response to treatment, examination of patient, obtaining history from patient or surrogate, ordering and performing treatments and interventions, ordering and review of laboratory studies, ordering and review of radiographic studies, pulse oximetry, re-evaluation of patient's condition, participation in multidisciplinary rounds and medical decision making of high complexity in the care of this patient.

## 2019-04-12 NOTE — Progress Notes (Signed)
Family member [employer] updated by Therapist, sports and NP.

## 2019-04-12 NOTE — Progress Notes (Signed)
Patient ID: [name], female   DOB: January 10, 1946, 74 y.o.   MRN: [id number]   [facility] Progress Note   Assessment/ Plan:   1.  Acute encephalopathy: Etiology remains unclear and underwent evaluation for seizure that was negative with neurologist suspicion pointing towards hypoxic/anoxic brain injury. 2. ESRD: Hemodialysis truncated early yesterday due to significant tachycardia, ongoing hemodialysis at this time for management of severe azotemia and to get her back to her outpatient dialysis schedule. 3. Anemia: Without overt blood loss and some downtrend of hemoglobin/hematocrit, continue to monitor. 4. CKD-MBD: Currently n.p.o. while intubated, significant hyperphosphatemia noted likely secondary to poor adherence with outpatient diet/binders.  Monitor with hemodialysis while inpatient. 5. Nutrition: Continue tube feeds with protein supplementation. 6. Hypertension: Blood pressure within acceptable range at this time, not on pressors.  Subjective:   Increased sedation overnight due to ventilator dyssynchrony.   Objective:   BP 116/61 (BP Location: Left Leg)   Pulse (!) 101   Temp 97.7 F (36.5 C) (Axillary)   Resp (!) 22   Wt 122.1 kg   SpO2 98%   BMI 42.16 kg/m   Physical Exam: Gen: Intubated, sedated on fentanyl, on hemodialysis via left brachiocephalic fistula CVS: Pulse regular tachycardia, S1 and S2 normal Resp: Anteriorly clear to auscultation, no distinct rales/rhonchi Abd: Soft, obese, nontender Ext: Trace left ankle edema, 1+ upper extremity edema.  LUA BCF cannulated  Labs: BMET Recent Labs  Lab 04/05/19 1250 04/05/19 1250 04/08/2019 2105 03/21/2019 2108 03/23/2019 2109 04/10/2019 2241 04/10/19 0525 04/10/19 1122 04/10/19 1517 04/11/19 0349 04/11/19 0353 04/11/19 1945 04/12/19 0413  NA 139   < > 141   < > 139 139 142 139 142 138  --   --  138  K 4.6   < > 6.6*   < > 6.2* 5.2* 5.5* 5.6* 6.1* 5.3*  --   --  5.1  CL 93*  --  101  --  105  --   103  --  100  --   --   --  100  CO2 32  --  19*  --   --   --  22  --  22  --   --   --  25  GLUCOSE 112*  --  186*  --  179*  --  106*  --  96  --   --   --  125*  BUN 25*  --  81*  --  69*  --  86*  --  93*  --   --   --  83*  CREATININE 6.22*  --  10.67*  --  11.50*  --  10.92*  --  11.73*  --   --   --  9.82*  CALCIUM 8.7*  --  8.7*  --   --   --  9.0  --  9.1  --   --   --  8.4*  PHOS  --   --   --   --   --   --  6.6*  --  6.8*  7.1*  --  7.6* 6.2* 7.3*   < > = values in this interval not displayed.   CBC Recent Labs  Lab 04/02/2019 2105 03/28/2019 2108 04/10/19 0525 04/10/19 0525 04/10/19 1122 04/10/19 1517 04/11/19 0349 04/12/19 0413  WBC 10.4  --  13.0*  --   --  14.2*  --  7.7  NEUTROABS 8.0*  --   --   --   --   --   --   --  HGB 14.7   < > 12.6   < > 12.6 11.9* 10.5* 9.5*  HCT 46.5*   < > 39.6   < > 37.0 37.2 31.0* 29.5*  MCV 86.3  --  86.1  --   --  85.9  --  86.5  PLT 222  --  168  --   --  181  --  142*   < > = values in this interval not displayed.      Medications:    . acetaminophen  650 mg Rectal Once  . chlorhexidine gluconate (MEDLINE KIT)  15 mL Mouth Rinse BID  . Chlorhexidine Gluconate Cloth  6 each Topical Q0600  . Chlorhexidine Gluconate Cloth  6 each Topical Q0600  . feeding supplement (PRO-STAT SUGAR FREE 64)  60 mL Per Tube TID  . feeding supplement (VITAL HIGH PROTEIN)  1,000 mL Per Tube Q24H  . heparin      . heparin  5,000 Units Subcutaneous Q8H  . ipratropium-albuterol  3 mL Nebulization Q6H  . mouth rinse  15 mL Mouth Rinse 10 times per day  . pantoprazole sodium  40 mg Per Tube Daily  . sodium chloride flush  10-40 mL Intracatheter Q12H  . sodium zirconium cyclosilicate  10 g Oral Once   Elmarie Shiley, MD 04/12/2019, 8:58 AM

## 2019-04-12 NOTE — Procedures (Addendum)
Patient seen on Hemodialysis. BP 116/61 (BP Location: Left Leg)   Pulse (!) 101   Temp 97.7 F (36.5 C) (Axillary)   Resp (!) 22   Wt 122.1 kg   SpO2 98%   BMI 42.16 kg/m   QB 400, UF goal 3L Tolerating treatment without problems at this time.   Elmarie Shiley MD [facility]. Office # [phone] Pager # [phone] 9:04 AM

## 2019-04-12 NOTE — Progress Notes (Addendum)
NAME:  [name], MRN:  [id number], DOB:  Apr 07, 1945, LOS: 2 ADMISSION DATE:  04/14/2019, CONSULTATION DATE:  04/20/2019  REFERRING MD: Dr. Ardith Dark of the ER, CHIEF COMPLAINT: Acute encephalopathy  Brief History   45 yobf remote smoker with found down pm 3/20  and subsequent intubation on arrival  in the ER and pccm team asked to admit   Past Medical History  ESRF, OSA on CPAP, AODM,CHF,AB/ nl pfts 10/2016    Significant Hospital Events   03/02/2019: ER visit for arthritic type knee pain and history of fall with generalized weakness.  Right knee was swollen and warm.  Treated with as needed hydrocodone 03/30/2019: ER visit for generalized weakness, dizziness and vertigo following dialysis.Marland Kitchen  MRI negative. 04/05/2019: Hypoglycemia after dialysis.  Brought in confused and disoriented to the ER 04/02/2019: Unresponsive intubated admitted via the ER 3/22: Ongoing tremors with stimulation, unresponsive, not following commands Consults:  04/04/2019 - Neuro  3/21  Renal   Procedures:  04/17/2019 Renaldo Harrison;  ETT in ER 04/10/2019 - Left IJ in ER 04/10/19 -  R fem art line   Significant Diagnostic Tests:   MR Brain w/o contrast 3/20: 1. No acute intracranial abnormality. 2. Diffuse soft tissue swelling throughout the right frontoparietal and temporal scalp. Query recent trauma. 3. 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 4. Underlying age-related cerebral atrophy with moderate chronic microvascular ischemic disease, with multiple additional remote lacunar infarcts as above. 5. Diffuse mucosal thickening with scattered air-fluid levels seen throughout the paranasal sinuses. EEG am 3/21 :  gen slowing but  no sz   Micro/ID  Data:  Covid 19  3/20 neg  MRSA  3/21 : neg  Blood 3/20 ordered but  not done prior to abx Respiratory culture 3/20 >>> Antimicrobials:  vanc 03/26/2019  X 2 gm > d/c'd 3/21 since mrsa screen neg (renal pt anyway so will linger) Cefepime 03/24/2019  >>>     Scheduled Meds: . acetaminophen  650 mg Rectal Once  . chlorhexidine gluconate (MEDLINE KIT)  15 mL Mouth Rinse BID  . Chlorhexidine Gluconate Cloth  6 each Topical Daily  . heparin  5,000 Units Subcutaneous Q8H  . insulin aspart  1-3 Units Subcutaneous Q4H  . ipratropium-albuterol  3 mL Nebulization Q6H  . mouth rinse  15 mL Mouth Rinse 10 times per day  . pantoprazole sodium  40 mg Per Tube Daily  . sodium chloride flush  10-40 mL Intracatheter Q12H  . sodium zirconium cyclosilicate  10 g Oral Once   Continuous Infusions: . [START ON 04/12/2019] ceFEPime (MAXIPIME) IV    . dextrose    . dextrose 5 % and 0.45% NaCl 10 mL/hr at 04/10/19 0600  . fentaNYL infusion INTRAVENOUS 25 mcg/hr (04/10/19 0600)  . levETIRAcetam Stopped (04/10/19 0525)  . niCARDipine Stopped (04/10/19 0246)  . propofol (DIPRIVAN) infusion 20 mcg/kg/min (04/10/19 0600)  . [START ON 04/12/2019] vancomycin     PRN Meds:.sodium chloride, sodium chloride, alteplase, fentaNYL (SUBLIMAZE) injection, fentaNYL (SUBLIMAZE) injection, heparin, hydrALAZINE, lidocaine (PF), lidocaine-prilocaine, midazolam, midazolam, pentafluoroprop-tetrafluoroeth, sodium chloride flush   Interim history/subjective:  Pt tolerated 10/5 , was pulling large volumes, but peak pressures are > 40 Now on full support, have restarted sedation K 5.1 Creatinine 9.82, Currently getting IHD CXR with vascular congestion/ edema Thick copious blood tinged  secretions 1.6 L has been pulled today per HD, then BP got soft WD Lower extremities, + cough, + blink, opens eyes to noxious stimulation + 1300 cc's  WBC  improved ( 14 >> 7 today) Objective   Blood pressure 116/61, pulse (!) 101, temperature 97.7 F (36.5 C), temperature source Axillary, resp. rate (!) 22, weight 122.1 kg, SpO2 98 %.    Vent Mode: CPAP;PSV FiO2 (%):  [30 %] 30 % Set Rate:  [16 bmp] 16 bmp Vt Set:  [500 mL] 500 mL PEEP:  [5 cmH20] 5 cmH20 Pressure Support:  [10 cmH20]  10 cmH20 Plateau Pressure:  [11 cmH20-19 cmH20] 11 cmH20   Intake/Output Summary (Last 24 hours) at 04/12/2019 0851 Last data filed at 04/12/2019 0600 Gross per 24 hour  Intake 1046.39 ml  Output 1697 ml  Net -650.61 ml   Filed Weights   04/11/19 0500 04/12/19 0420 04/12/19 0715  Weight: 120.1 kg 119.1 kg 122.1 kg    Examination:  General this is an obese 74 year old acutely ill appearing black female she is sedated and intubated , IN NAD on full support via vent HEENT: Orally intubated, thick neck, , tongue appears somewhat edematous, copious oral BT secretions, pupils equal reactive Pulmonary: Bilateral chest excursion, Scattered rhonchi, accessory use noted when stimulated, crackles per bases Cardiac: S1, S2, Regular rate and rhythm, No RMG Abdomen obese, soft, non-tender, BS + Extremities diffuse anasarca Neuro opens eyes to noxious stimuli, WD lower extremities, + cough, + blink GU anuric   Resolved Hospital Problem list    Comatose. Etiology unclear, several potential metabolic inciting factors such as: Sepsis, hypertension, hypoglycemia, renal failure.  MRI showed diffuse soft tissue swelling throughout the right frontoparietal and temporal scalp raising concern for trauma chronic right parietal left occipital infarcts atrophy moderate chronic micro vascular ischemic disease plan Blood pressure goal 140-1 60 Serial neuro checks Correct metabolic derangements>> Appreciate Renal assistance EEG per neurology >> No seizure activity Continue Keppra  Acute Respiratory Failure due to acute encephalopathy - intubated in ER - pfts 2018 s copd or any airflow obst  Portable chest x-ray personally reviewed: Low volume chest with cardiomegaly and vascular congestion/edema Elevated PIP 2/2 secretions/ Breath stacking Plan Cont full vent support Continue sedation for now SBT each am PAD protocol, RASS goal -2 VAP bundle Trend CXR Continue HD with volume removal as BP  toerates  Fever leukocytosis, suspect aspiration pneumonia Fever curve improved since yesterday T Max 99.2 last 24 hours Sputum Cx + for MSSA Plan Follow-up sputum culture>> Growing Staph Aureus Will d/c cefepime and add Ancef to better cover for gram negatives vancomycin on 3/20   Known C-Diast CHF (normal cath 2019) Mild MS and AS on echo May 2020 Hypertension, had mild troponin elevation Blood pressure better controlled following dialysis Plan As needed hydralazine Dialysis Goal blood pressure 140-160 Will re-check echo   ESRD on TTS HD Missed Saturday HD Plan Per nephrology  Fluid and electrolyte imbalance: Hyperkalemia Hypocalcemia, secondary hyperparathyroidism  plan Continue to trend electrolytes  Received dialysis on 3/21, 3/22 and again 3/23   DM - hypoglycemic ER visit in march 2021, excellent glycemic control while here Plan Every 4 hour Accu-Cheks with sliding scale coverage ordered  Enlarged Thyroid left lobe on admit CT 04/16/2019 Plan This will necessitate close observation at time of extubation, specifically checking for airway leak test Consider [place] and biopsy while in hospital if survives acute illness  At risk sacral decub Plan Maximize nutrition Pressure relief interventions Frequent turning  Pulling good volumes on PS, but copious blood tinged secretions, elevated PIP and Mental status are barriers to extubation at this point. Per Neuro, patient is not having Seizures  per EEG eval. Hopeful that once metabolic derangements are resolved with HD, Neuro status will improve   Best practice:  Diet: npo.  Tube feeds Pain/Anxiety/Delirium protocol (if indicated): diprivan drip  and fent  prn VAP protocol (if indicated): HOB > 30 DVT prophylaxis: heparin sq GI prophylaxis: ppi Glucose control: ssi Mobility: bed rest Code Status: full code Family Communication:Daughter updated at bedside 3/23  Disposition: ICU  My critical care time 40   minutes  Magdalen Spatz, MSN, AGACNP-BC [place] Pager # [phone] After 4 pm please call [phone] 04/12/2019 8:52 AM

## 2019-04-12 NOTE — Progress Notes (Signed)
  Echocardiogram 2D Echocardiogram has been performed.  [name] 04/12/2019, 12:20 PM

## 2019-04-13 ENCOUNTER — Encounter (HOSPITAL_COMMUNITY): Payer: Self-pay | Admitting: Internal Medicine

## 2019-04-13 ENCOUNTER — Inpatient Hospital Stay (HOSPITAL_COMMUNITY): Payer: Medicare Other

## 2019-04-13 LAB — GLUCOSE, CAPILLARY
Glucose-Capillary: 100 mg/dL — ABNORMAL HIGH (ref 70–99)
Glucose-Capillary: 101 mg/dL — ABNORMAL HIGH (ref 70–99)
Glucose-Capillary: 103 mg/dL — ABNORMAL HIGH (ref 70–99)
Glucose-Capillary: 105 mg/dL — ABNORMAL HIGH (ref 70–99)
Glucose-Capillary: 127 mg/dL — ABNORMAL HIGH (ref 70–99)
Glucose-Capillary: 95 mg/dL (ref 70–99)

## 2019-04-13 LAB — COMPREHENSIVE METABOLIC PANEL
ALT: 13 U/L (ref 0–44)
AST: 14 U/L — ABNORMAL LOW (ref 15–41)
Albumin: 2.2 g/dL — ABNORMAL LOW (ref 3.5–5.0)
Alkaline Phosphatase: 43 U/L (ref 38–126)
Anion gap: 13 (ref 5–15)
BUN: 57 mg/dL — ABNORMAL HIGH (ref 8–23)
CO2: 27 mmol/L (ref 22–32)
Calcium: 8.6 mg/dL — ABNORMAL LOW (ref 8.9–10.3)
Chloride: 95 mmol/L — ABNORMAL LOW (ref 98–111)
Creatinine, Ser: 6.7 mg/dL — ABNORMAL HIGH (ref 0.44–1.00)
GFR calc Af Amer: 6 mL/min — ABNORMAL LOW (ref >60)
GFR calc non Af Amer: 6 mL/min — ABNORMAL LOW (ref >60)
Glucose, Bld: 116 mg/dL — ABNORMAL HIGH (ref 70–99)
Potassium: 4.8 mmol/L (ref 3.5–5.1)
Sodium: 135 mmol/L (ref 135–145)
Total Bilirubin: 0.6 mg/dL (ref 0.3–1.2)
Total Protein: 6.1 g/dL — ABNORMAL LOW (ref 6.5–8.1)

## 2019-04-13 LAB — CBC
HCT: 30.3 % — ABNORMAL LOW (ref 36.0–46.0)
Hemoglobin: 9.6 g/dL — ABNORMAL LOW (ref 12.0–15.0)
MCH: 27.3 pg (ref 26.0–34.0)
MCHC: 31.7 g/dL (ref 30.0–36.0)
MCV: 86.1 fL (ref 80.0–100.0)
Platelets: 154 10*3/uL (ref 150–400)
RBC: 3.52 MIL/uL — ABNORMAL LOW (ref 3.87–5.11)
RDW: 18.9 % — ABNORMAL HIGH (ref 11.5–15.5)
WBC: 7 10*3/uL (ref 4.0–10.5)
nRBC: 0 % (ref 0.0–0.2)

## 2019-04-13 LAB — CK TOTAL AND CKMB (NOT AT ARMC)
CK, MB: 1.9 ng/mL (ref 0.5–5.0)
Relative Index: INVALID (ref 0.0–2.5)
Total CK: 73 U/L (ref 38–234)

## 2019-04-13 LAB — TRIGLYCERIDES: Triglycerides: 90 mg/dL (ref <150)

## 2019-04-13 LAB — ECHOCARDIOGRAM COMPLETE: Weight: 4306.91 oz

## 2019-04-13 LAB — LACTIC ACID, PLASMA: Lactic Acid, Venous: 1 mmol/L (ref 0.5–1.9)

## 2019-04-13 LAB — MAGNESIUM: Magnesium: 2.2 mg/dL (ref 1.7–2.4)

## 2019-04-13 LAB — PHOSPHORUS: Phosphorus: 7 mg/dL — ABNORMAL HIGH (ref 2.5–4.6)

## 2019-04-13 MED ORDER — PRO-STAT SUGAR FREE PO LIQD
60.0000 mL | Freq: Two times a day (BID) | ORAL | Status: DC
  Administered 2019-04-13 – 2019-04-17 (×9): 60 mL
  Filled 2019-04-13 (×9): qty 60

## 2019-04-13 MED ORDER — ALUMINUM HYDROXIDE GEL 320 MG/5ML PO SUSP
30.0000 mL | Freq: Three times a day (TID) | ORAL | Status: DC
  Filled 2019-04-13 (×3): qty 30

## 2019-04-13 MED ORDER — NICARDIPINE HCL IN NACL 20-0.86 MG/200ML-% IV SOLN: 0.0000 mg/h | INTRAVENOUS | Status: DC

## 2019-04-13 MED ORDER — VITAL HIGH PROTEIN PO LIQD
1000.0000 mL | ORAL | Status: DC
  Administered 2019-04-13 – 2019-04-16 (×2): 1000 mL

## 2019-04-13 MED ORDER — ALUMINUM HYDROXIDE GEL 320 MG/5ML PO SUSP
30.0000 mL | Freq: Three times a day (TID) | ORAL | Status: AC
  Administered 2019-04-13 – 2019-04-15 (×8): 30 mL
  Filled 2019-04-13 (×8): qty 30

## 2019-04-13 NOTE — Procedures (Signed)
Cortrak  Person Inserting Tube:  Megha Agnes, Creola Corn, RD Tube Type:  Cortrak - 43 inches Tube Location:  Left nare Initial Placement:  Stomach Secured by: Bridle Technique Used to Measure Tube Placement:  Documented cm marking at nare/ corner of mouth Cortrak Secured At:  74 cm    Cortrak Tube Team Note:  Consult received to place a Cortrak feeding tube.   No x-ray is required. RN may begin using tube.   If the tube becomes dislodged please keep the tube and contact the Cortrak team at [email or web address] (password [email or web address]) for replacement.  If after hours and replacement cannot be delayed, place a NG tube and confirm placement with an abdominal x-ray.    Larkin Ina, MS, RD, LDN RD pager number and weekend/on-call pager number located in [place].

## 2019-04-13 NOTE — Progress Notes (Signed)
Neurology Progress Note   S:// Seen and examined.  Difficult to keep off of sedation as she has lot of cough and gets dyssynchronous with the vent without sedation.  Propofol was held for this exam this morning.   O:// Current vital signs: BP (!) 157/63   Pulse 78   Temp 98.7 F (37.1 C) (Axillary)   Resp 13   Wt 122.8 kg   SpO2 99%   BMI 42.40 kg/m  Vital signs in last 24 hours: Temp:  [97.7 F (36.5 C)-100.1 F (37.8 C)] 98.7 F (37.1 C) (03/24 0000) Pulse Rate:  [78-107] 78 (03/24 0600) Resp:  [0-26] 13 (03/24 0600) BP: (66-162)/(33-130) 157/63 (03/24 0600) SpO2:  [91 %-100 %] 99 % (03/24 0600) Arterial Line BP: (96-198)/(48-80) 185/72 (03/23 1300) FiO2 (%):  [30 %] 30 % (03/24 0250) Weight:  [122.8 kg] 122.8 kg (03/24 0427) Neurological exam Was sedated on propofol which was held for examination.  Intubated. Does not spontaneously open eyes.  Does not follow commands. Cranial: Pupils equal round reactive to light sluggishly, unable to assess for visual fields, gaze midline with no forced deviation or preference, face symmetry difficult to ascertain due to endotracheal tube. Motor exam: On noxious stimulation, has withdrawal in bilateral lower extremities.  Grimaces to noxious stimulation in both upper extremities. Sensory exam: As above Coordination cannot be assessed due to her mentation Gait testing deferred due to her mentation   Medications  Current Facility-Administered Medications:  .  0.9 %  sodium chloride infusion, 100 mL, Intravenous, PRN, Penninger, Lindsay, PA .  0.9 %  sodium chloride infusion, 100 mL, Intravenous, PRN, Penninger, Ria Comment, PA .  acetaminophen (TYLENOL) suppository 650 mg, 650 mg, Rectal, Once, Sherwood Gambler, MD .  alteplase (CATHFLO ACTIVASE) injection 2 mg, 2 mg, Intracatheter, Once PRN, Penninger, Ria Comment, PA .  ceFAZolin (ANCEF) IVPB 1 g/50 mL premix, 1 g, Intravenous, Q24H, Magdalen Spatz, NP, Stopped at 04/12/19 1855 .   chlorhexidine gluconate (MEDLINE KIT) (PERIDEX) 0.12 % solution 15 mL, 15 mL, Mouth Rinse, BID, Ramaswamy, Murali, MD, 15 mL at 04/12/19 1920 .  Chlorhexidine Gluconate Cloth 2 % PADS 6 each, 6 each, Topical, Q0600, Penninger, Lindsay, [place], 6 each at 04/13/19 [phone] .  Chlorhexidine Gluconate Cloth 2 % PADS 6 each, 6 each, Topical, Q0600, Roney Jaffe, MD, 6 each at 04/13/19 [phone]  feeding supplement (PRO-STAT SUGAR FREE 64) liquid 60 mL, 60 mL, Per Tube, TID, Tanda Rockers, MD, 60 mL at 04/12/19 2057 .  feeding supplement (VITAL HIGH PROTEIN) liquid 1,000 mL, 1,000 mL, Per Tube, Q24H, Christinia Gully B, MD, 1,000 mL at 04/12/19 1700 .  fentaNYL (SUBLIMAZE) injection 25 mcg, 25 mcg, Intravenous, Q15 min PRN, Chase Caller, Murali, MD .  fentaNYL (SUBLIMAZE) injection 50-200 mcg, 50-200 mcg, Intravenous, Q30 min PRN, Brand Males, MD .  heparin injection 1,000 Units, 1,000 Units, Dialysis, PRN, Penninger, Ria Comment, PA .  heparin injection 5,000 Units, 5,000 Units, Subcutaneous, Q8H, Brand Males, MD, 5,000 Units at 04/13/19 5409 .  hydrALAZINE (APRESOLINE) injection 10-40 mg, 10-40 mg, Intravenous, Q4H PRN, Brand Males, MD, 40 mg at 04/11/19 0834 .  ipratropium-albuterol (DUONEB) 0.5-2.5 (3) MG/3ML nebulizer solution 3 mL, 3 mL, Nebulization, Q6H, Ramaswamy, Murali, MD, 3 mL at 04/13/19 0250 .  levETIRAcetam (KEPPRA) IVPB 500 mg/100 mL premix, 500 mg, Intravenous, Q12H, Aroor, Lanice Schwab, MD, Last Rate: 400 mL/hr at 04/13/19 0615, 500 mg at 04/13/19 0615 .  lidocaine (PF) (XYLOCAINE) 1 % injection 5 mL, 5 mL, Intradermal,  PRN, Penninger, Ria Comment, [place] .  lidocaine-prilocaine (EMLA) cream 1 application, 1 application, Topical, PRN, Penninger, Lindsay, PA .  MEDLINE mouth rinse, 15 mL, Mouth Rinse, 10 times per day, Brand Males, MD, 15 mL at 04/13/19 0616 .  midazolam (VERSED) injection 1 mg, 1 mg, Intravenous, Q15 min PRN, Brand Males, MD .  midazolam (VERSED) injection 1 mg, 1  mg, Intravenous, Q2H PRN, Brand Males, MD, 1 mg at 04/10/19 1550 .  nicardipine (CARDENE) 3m in 0.86% saline 2051mIV infusion (0.1 mg/ml), 0-15 mg/hr, Intravenous, Continuous, Ogan, OkKerry KassMD, Stopped at 04/10/19 0246 .  pantoprazole sodium (PROTONIX) 40 mg/20 mL oral suspension 40 mg, 40 mg, Per Tube, Daily, Ramaswamy, Murali, MD, 40 mg at 04/12/19 0900 .  pentafluoroprop-tetrafluoroeth (GEBAUERS) aerosol 1 application, 1 application, Topical, PRN, Penninger, Lindsay, PA .  propofol (DIPRIVAN) 1000 MG/100ML infusion, 0-50 mcg/kg/min, Intravenous, Continuous, Ramaswamy, Murali, MD, Last Rate: 13.82 mL/hr at 04/13/19 0600, 20 mcg/kg/min at 04/13/19 0600 .  sodium chloride flush (NS) 0.9 % injection 10-40 mL, 10-40 mL, Intracatheter, Q12H, RaBrand MalesMD, 10 mL at 04/12/19 2058 .  sodium chloride flush (NS) 0.9 % injection 10-40 mL, 10-40 mL, Intracatheter, PRN, RaChase CallerMurali, MD .  sodium zirconium cyclosilicate (LOKELMA) packet 10 g, 10 g, Oral, Once, RaBrand MalesMD Labs CBC    Component Value Date/Time   WBC 7.0 04/13/2019 0331   RBC 3.52 (L) 04/13/2019 0331   HGB 9.6 (L) 04/13/2019 0331   HCT 30.3 (L) 04/13/2019 0331   PLT 154 04/13/2019 0331   MCV 86.1 04/13/2019 0331   MCH 27.3 04/13/2019 0331   MCHC 31.7 04/13/2019 0331   RDW 18.9 (H) 04/13/2019 0331   LYMPHSABS 1.4 04/14/2019 2105   MONOABS 0.9 03/29/2019 2105   EOSABS 0.0 03/26/2019 2105   BASOSABS 0.0 03/25/2019 2105    CMP     Component Value Date/Time   NA 135 04/13/2019 0331   K 4.8 04/13/2019 0331   CL 95 (L) 04/13/2019 0331   CO2 27 04/13/2019 0331   GLUCOSE 116 (H) 04/13/2019 0331   BUN 57 (H) 04/13/2019 0331   CREATININE 6.70 (H) 04/13/2019 0331   CREATININE 2.86 (H) 10/10/2010 1140   CALCIUM 8.6 (L) 04/13/2019 0331   CALCIUM 8.7 01/08/2010 1121   PROT 6.1 (L) 04/13/2019 0331   ALBUMIN 2.2 (L) 04/13/2019 0331   AST 14 (L) 04/13/2019 0331   ALT 13 04/13/2019 0331   ALKPHOS 43  04/13/2019 0331   BILITOT 0.6 04/13/2019 0331   GFRNONAA 6 (L) 04/13/2019 0331   GFRAA 6 (L) 04/13/2019 0331   Imaging I have reviewed images in epic and the results pertinent to this consultation are: MRI brain shows no acute intracranial abnormality.  Shows diffuse soft tissue swelling throughout the right frontoparietal and temporal scalp-likely due to trauma.  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  Underlying age-related cerebral atrophy with moderate chronic microvascular ischemic disease and remote lacunar infarcts.  Assessment: [age]ear old woman brought in after being found down and neurology consulted for encephalopathy versus unwitnessed seizure. Given old cortical infarcts, seizures are in the realm of possibility but she did not show any evidence of epileptogenic city on her EEG-LTM EEG was done and discontinued after prolonged EEG did not show any seizures. She is severely encephalopathic-likely multifactorial due to severely deranged renal function as well as pneumonia. Tremulous/shaky movements were likely myoclonus which raises concern for underlying hypoxic/anoxic injury although MRI of the brain did not reveal  any obvious global anoxic changes.  Impression: Multifactorial toxic metabolic encephalopathy, some component of hypoxic ischemic encephalopathy.  Recommendations: Continue supportive care per primary team as you are. Continue Keppra for now. If the tremulous/shaky movements reappear-can add benzos such as clonazepam or another antiepileptic like Depakote. Correction of toxic metabolic derangements per primary team and nephrology as you are. Continue to attempt to minimize sedation as tolerated from a ventilator management perspective. We will follow clinically with you.  Do not see any need for repeat brain imaging today.  -- Amie Portland, MD [facility] Pager: [phone] If 7pm to 7am, please call on call as listed on  AMION.  CRITICAL CARE ATTESTATION Performed by: Amie Portland, MD Total critical care time: 34 minutes Critical care time was exclusive of separately billable procedures and treating other patients and/or supervising APPs/Residents/Students Critical care was necessary to treat or prevent imminent or life-threatening deterioration due to multifactorial toxic metabolic encephalopathy. This patient is critically ill and at significant risk for neurological worsening and/or death and care requires constant monitoring. Critical care was time spent personally by me on the following activities: development of treatment plan with patient and/or surrogate as well as nursing, discussions with consultants, evaluation of patient's response to treatment, examination of patient, obtaining history from patient or surrogate, ordering and performing treatments and interventions, ordering and review of laboratory studies, ordering and review of radiographic studies, pulse oximetry, re-evaluation of patient's condition, participation in multidisciplinary rounds and medical decision making of high complexity in the care of this patient.

## 2019-04-13 NOTE — Progress Notes (Signed)
Patient ID: [name], female   DOB: 07-09-1945, 74 y.o.   MRN: [id number]  [facility] Progress Note   Assessment/ Plan:   1.  Acute encephalopathy: No evidence of seizure and suspected to be metabolic encephalopathy; will continue to manage azotemia with hemodialysis. 2. ESRD: Underwent hemodialysis yesterday without problems with net negative fluid balance overnight.  We will plan for hemodialysis again tomorrow to continue her usual outpatient TTS schedule. 3. Anemia: Hemoglobin drop overnight noted without overt loss, continue to trend and restarted ESA. 4. CKD-MBD: Currently n.p.o. while intubated, significant hyperphosphatemia noted likely secondary to poor adherence with outpatient diet/binders.  Off binders at this time. 5. Nutrition: Continue tube feeds with protein supplementation. 6. Hypertension: Blood pressure intermittently elevated-possibly related to agitation/tremulousness.  We will continue to follow with hemodialysis.  Subjective:   Tremulousness/agitation noted overnight requiring sedation.   Objective:   BP (!) 183/73   Pulse 80   Temp 98 F (36.7 C) (Axillary)   Resp 16   Wt 122.8 kg   SpO2 99%   BMI 42.40 kg/m   Physical Exam: Gen: Intubated, sedated on fentanyl, appears comfortable.  Daughter at bedside. CVS: Pulse regular tachycardia, S1 and S2 normal Resp: Anteriorly clear to auscultation, no distinct rales/rhonchi Abd: Soft, obese, nontender Ext: Trace left ankle edema, 1+ upper extremity edema.  LUA BCF with intact dressings  Labs: BMET Recent Labs  Lab 03/27/2019 2105 04/04/2019 2108 03/23/2019 2109 04/07/2019 2109 03/31/2019 2241 04/10/19 0525 04/10/19 1122 04/10/19 1517 04/11/19 0349 04/11/19 0353 04/11/19 1945 04/12/19 0413 04/12/19 2000 04/13/19 0331  NA 141   < > 139   < > 139 142 139 142 138  --   --  138  --  135  K 6.6*   < > 6.2*   < > 5.2* 5.5* 5.6* 6.1* 5.3*  --   --  5.1  --  4.8  CL 101  --  105  --   --  103   --  100  --   --   --  100  --  95*  CO2 19*  --   --   --   --  22  --  22  --   --   --  25  --  27  GLUCOSE 186*  --  179*  --   --  106*  --  96  --   --   --  125*  --  116*  BUN 81*  --  69*  --   --  86*  --  93*  --   --   --  83*  --  57*  CREATININE 10.67*  --  11.50*  --   --  10.92*  --  11.73*  --   --   --  9.82*  --  6.70*  CALCIUM 8.7*  --   --   --   --  9.0  --  9.1  --   --   --  8.4*  --  8.6*  PHOS  --   --   --   --   --  6.6*  --  6.8*  7.1*  --  7.6* 6.2* 7.3* 6.0* 7.0*   < > = values in this interval not displayed.   CBC Recent Labs  Lab 04/14/2019 2105 04/11/2019 2108 04/10/19 0525 04/10/19 1122 04/10/19 1517 04/11/19 0349 04/12/19 0413 04/13/19 0331  WBC 10.4   < > 13.0*  --  14.2*  --  7.7 7.0  NEUTROABS 8.0*  --   --   --   --   --   --   --   HGB 14.7   < > 12.6   < > 11.9* 10.5* 9.5* 9.6*  HCT 46.5*   < > 39.6   < > 37.2 31.0* 29.5* 30.3*  MCV 86.3   < > 86.1  --  85.9  --  86.5 86.1  PLT 222   < > 168  --  181  --  142* 154   < > = values in this interval not displayed.      Medications:    . acetaminophen  650 mg Rectal Once  . chlorhexidine gluconate (MEDLINE KIT)  15 mL Mouth Rinse BID  . Chlorhexidine Gluconate Cloth  6 each Topical Q0600  . Chlorhexidine Gluconate Cloth  6 each Topical Q0600  . feeding supplement (PRO-STAT SUGAR FREE 64)  60 mL Per Tube BID  . heparin  5,000 Units Subcutaneous Q8H  . ipratropium-albuterol  3 mL Nebulization Q6H  . mouth rinse  15 mL Mouth Rinse 10 times per day  . pantoprazole sodium  40 mg Per Tube Daily  . sodium chloride flush  10-40 mL Intracatheter Q12H  . sodium zirconium cyclosilicate  10 g Oral Once   Elmarie Shiley, MD 04/13/2019, 9:08 AM

## 2019-04-13 NOTE — Progress Notes (Signed)
Nutrition Follow-up  DOCUMENTATION CODES:   Morbid obesity  INTERVENTION:   Increase Vital High Protein to 35 ml/hr (840 ml/day) via OG tube Decrease Prostat to 60 ml BID  Provides: 1240 kcal, 133 grams protein, and 702 ml free water.   TF regimen and propofol at current rate providing 1583 total kcal/day   Recommend cortrak tube  NUTRITION DIAGNOSIS:   Inadequate oral intake related to inability to eat as evidenced by NPO status. Ongoing   GOAL:   Provide needs based on ASPEN/SCCM guidelines Meeting with TF  MONITOR:   TF tolerance, Vent status  REASON FOR ASSESSMENT:   Consult, Ventilator Enteral/tube feeding initiation and management  ASSESSMENT:   Pt with PMH of ESRD on HD TTS, OSA on CPAP, DM, CHF, remote smoker who was found down.  Per MD tissue swelling in R frontoparietal and temporal scalp concern for trauma chronic R parietal L occipital infarcts.  Per chart review unable to wean sedation due to coughing.  Per Renal, significant hyperphosphatemia likely related to poor adherence with outpatient diet/binders.   Patient is currently intubated on ventilator support MV: 8.4 L/min Temp (24hrs), Avg:98.6 F (37 C), Min:97.9 F (36.6 C), Max:100.1 F (37.8 C)  Propofol: 13 ml/hr provides 343 kcal  Medications reviewed and include: lokelma Labs reviewed: PO4: 7.0 (H)  Net UF: 2500 ml   16 F OG tube  TF: Vital High Protein @ 20 ml/hr with 60 ml Prostat TID Provides: 1080 kcal, 132 grams protein  Diet Order:   Diet Order            Diet NPO time specified  Diet effective now              EDUCATION NEEDS:   No education needs have been identified at this time  Skin:  Skin Assessment: Reviewed RN Assessment  Last BM:  3/22  Height:   Ht Readings from Last 1 Encounters:  04/05/19 5\' 7"  (1.702 m)    Weight:   Wt Readings from Last 1 Encounters:  04/13/19 122.8 kg  EDW: 112 kg  Ideal Body Weight:  61.3 kg  BMI:  Body mass index is  42.4 kg/m.  Estimated Nutritional Needs:   Kcal:  1300-1700  Protein:  122-153 grams  Fluid:  >1.5 L/day  Lockie Pares., RD, LDN, CNSC See AMiON for contact information

## 2019-04-13 NOTE — Progress Notes (Signed)
NAME:  [name], MRN:  [id number], DOB:  1945-07-14, LOS: 3 ADMISSION DATE:  03/22/2019, CONSULTATION DATE:  03/31/2019 REFERRING MD: Dr. Regenia Skeeter, CHIEF COMPLAINT: Acute encephalopathy  Brief History   74 y/o F, remote smoker, found down pm 3/20, subsequent intubation on arrival. MRI brain negative for acute process on admit.   Past Medical History  ESRD - on HD T,Th,S OSA - on CPAP  CHF  DM    Significant Hospital Events   03/02/2019 ER visit for arthritic type knee pain and history of fall with generalized weakness.  Right knee was swollen and warm.  Treated with as needed hydrocodone 03/30/2019 ER visit for generalized weakness, dizziness and vertigo following dialysis.Marland Kitchen  MRI negative. 04/05/2019 Hypoglycemia after dialysis.  Brought in confused and disoriented to the ER 3/20 Admit, after being found unresponsive, intubated in ER 3/22 Ongoing tremors with stimulation, unresponsive, not following commands 3/23 Failed PSV, iHD, blood tinged secretions  Consults:  Neuro  Nephrology    Procedures:  ETT 3/20 >>  L IJ (ER) 3/20 >>  R Fem Aline 3/21 >>   Significant Diagnostic Tests:   MR Brain w/o contrast 3/20 >> No acute intracranial abnormality. Diffuse soft tissue swelling throughout the right frontoparietal and temporal scalp. Query recent trauma. 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 Underlying age-related cerebral atrophy with moderate chronic microvascular ischemic disease, with multiple additional remote lacunar infarcts as above. Diffuse mucosal thickening with scattered air-fluid levels seen throughout the paranasal sinuses.  EEG 3/21 >> gen slowing but no sz   ECHO/TTE 3/23 >>   Micro/ID  Data:  COVID 3/20 >> negative  Influenza A/B 3/20 >> negative MRSA PCR 3/21 >> negative  Tracheal aspirate 3/21 >> MSSA  BCx2 3/21 >>   Antimicrobials:  Vanco 3/20 >> 3/21  Cefepime 3/20 >> 3/23 Cefazolin 3/23 >>   Interim history/subjective:    Afebrile, tmax 100.1 Glucose range 100-130 I/O 2.5L removed with HD, -1L for 24h On vent, 30% FiO2, PEEP 5  Objective   Blood pressure (!) 183/73, pulse 80, temperature 98 F (36.7 C), temperature source Axillary, resp. rate 16, weight 122.8 kg, SpO2 99 %.    Vent Mode: CPAP;PSV FiO2 (%):  [30 %] 30 % Set Rate:  [16 bmp] 16 bmp Vt Set:  [500 mL] 500 mL PEEP:  [5 cmH20] 5 cmH20 Pressure Support:  [10 cmH20] 10 cmH20 Plateau Pressure:  [18 cmH20-19 cmH20] 19 cmH20   Intake/Output Summary (Last 24 hours) at 04/13/2019 0914 Last data filed at 04/13/2019 0600 Gross per 24 hour  Intake 1447.96 ml  Output 2500 ml  Net -1052.04 ml   Filed Weights   04/12/19 0420 04/12/19 0715 04/13/19 0427  Weight: 119.1 kg 122.1 kg 122.8 kg    Examination: General: acutely ill appearing female on vent in NAD HEENT: MM pink/moist, ETT, anicteric, R eye prosthesis, pupil on L sluggishly reactive Neuro: off propofol, opens eyes, some spontaneous movement noted with stimulation, weaning on PSV, no follow commands CV: s1s2 rrr, SEM  PULM:  Non-labored on vent, lungs bilaterally clear anterior, diminished bases, bloody secretions in HME GI: soft, bsx4 active  Extremities: warm/dry, no edema.  LUE AVF with positive thrill / bruit  Skin: no rashes or lesions  Resolved Hospital Problem list    Acute Metabolic Encephalopathy  Etiology unclear, concern for possible seizure as inciting event, possible hypoxic ischemic component, AKI and PNA/infectious contribution. EEG negative. MRI with suspected R frontoparietal soft tissue swelling, likely due  to trauma. Chronic infarcts + atrophy noted.  -continue supportive care in ICU  -BP goal 140-160 -follow serial neuro checks  -appreciate Neurology assistance with patient care -goal to minimize sedation as much as able but vent dyssynchrony has been limiting factor  -continue keppra -seizure precautions  Acute Respiratory Failure in setting of Acute  Encephalopathy  Prior PFT's 2018 without airflow obstruction. Remote smoker.  -PRVC 8cc/kg -wean PEEP / FiO2 for sats >90% -daily SBT -PAD protocol for RASS Goal 0 to -1 -follow intermittent CXR  -duoneb Q6  MSSA PNA, Suspected Aspiration  Fever, Leukocytosis  -follow fever curve, CXR as above  -continue ancef, D5/7 abx.  Stop date added.   Chronic Diastolic CHF  Hypertension Mild Troponin Elevation  Normal LHC 2019, Mild AS/MS on ECHO 05/2018.  Hypertensive on admit with improved BP after HD.  -continue HD for volume removal  -SBP goal 140-160 -PRN cardene if necessary for BP control  -PRN hydralazine   ESRD on TTS HD Missed HD prior to admit  -appreciate Nephrology assistance with patient care  -Trend BMP / urinary output -Avoid nephrotoxic agents, ensure adequate renal perfusion  Fluid and electrolyte imbalance: Hyperkalemia, Hypocalcemia, Hyperphosphatemia, Secondary hyperparathyroidism  -monitor and replace as indicated  Anemia  -trend CBC -transfuse for Hgb <7%  DM  Hx of hypoglycemic ER visit in 03/2019  -CBG Q4  -add SSI if glucose consistently >180  Incidental Finding of Enlarged Left Thyroid Lobe on admit CT 04/03/2019 -will need leak test and cautious extubation  -will need outpatient follow up if survives acute illness  At Risk Pressure Injury  -maximize nutrition  -pressure relief interventions   Best practice:  Diet: npo.  Tube feeds Pain/Anxiety/Delirium protocol (if indicated): in place VAP protocol (if indicated): HOB > 30 DVT prophylaxis: heparin sq GI prophylaxis: PPI Glucose control: CBG Q4  Mobility: BR Code Status: Full Code  Family Communication: Daughter [name]) updated via phone 3/24.  Reviewed current state of illness with daughter.  Discussed the longer the patient goes without waking, the less likely she will.  Reviewed no clear cause of injury but suspicion for possible hypoxic insult when down at home. She indicates the patient has  previously stated she would not want long term support but they want to give her every opportunity to get better from this acute illness. Discussed if we were approaching the two week mark that we may need to revisit goals of care. Introduced that tracheostomy may not be in her best interest based on prior expressed wishes. Daughter indicates understanding and would like the help of her family if any big decisions are to be made for [place].  Will continue support family & update daily. Continue current plan of care.  Disposition: ICU   CC Time: 26 minutes   Noe Gens, MSN, NP-C [facility] 04/13/2019, 9:14 AM   Please see [email or web address] for pager details.

## 2019-04-14 ENCOUNTER — Inpatient Hospital Stay (HOSPITAL_COMMUNITY): Payer: Medicare Other

## 2019-04-14 LAB — GLUCOSE, CAPILLARY
Glucose-Capillary: 104 mg/dL — ABNORMAL HIGH (ref 70–99)
Glucose-Capillary: 104 mg/dL — ABNORMAL HIGH (ref 70–99)
Glucose-Capillary: 117 mg/dL — ABNORMAL HIGH (ref 70–99)
Glucose-Capillary: 121 mg/dL — ABNORMAL HIGH (ref 70–99)
Glucose-Capillary: 143 mg/dL — ABNORMAL HIGH (ref 70–99)
Glucose-Capillary: 90 mg/dL (ref 70–99)

## 2019-04-14 LAB — CBC
HCT: 31.2 % — ABNORMAL LOW (ref 36.0–46.0)
Hemoglobin: 9.8 g/dL — ABNORMAL LOW (ref 12.0–15.0)
MCH: 27 pg (ref 26.0–34.0)
MCHC: 31.4 g/dL (ref 30.0–36.0)
MCV: 86 fL (ref 80.0–100.0)
Platelets: 190 10*3/uL (ref 150–400)
RBC: 3.63 MIL/uL — ABNORMAL LOW (ref 3.87–5.11)
RDW: 19 % — ABNORMAL HIGH (ref 11.5–15.5)
WBC: 10.6 10*3/uL — ABNORMAL HIGH (ref 4.0–10.5)
nRBC: 0 % (ref 0.0–0.2)

## 2019-04-14 LAB — BASIC METABOLIC PANEL
Anion gap: 16 — ABNORMAL HIGH (ref 5–15)
BUN: 81 mg/dL — ABNORMAL HIGH (ref 8–23)
CO2: 26 mmol/L (ref 22–32)
Calcium: 9.4 mg/dL (ref 8.9–10.3)
Chloride: 95 mmol/L — ABNORMAL LOW (ref 98–111)
Creatinine, Ser: 8.41 mg/dL — ABNORMAL HIGH (ref 0.44–1.00)
GFR calc Af Amer: 5 mL/min — ABNORMAL LOW (ref >60)
GFR calc non Af Amer: 4 mL/min — ABNORMAL LOW (ref >60)
Glucose, Bld: 102 mg/dL — ABNORMAL HIGH (ref 70–99)
Potassium: 4.9 mmol/L (ref 3.5–5.1)
Sodium: 137 mmol/L (ref 135–145)

## 2019-04-14 LAB — TRIGLYCERIDES: Triglycerides: 93 mg/dL (ref <150)

## 2019-04-14 MED ORDER — CLONAZEPAM 1 MG PO TABS
1.0000 mg | ORAL_TABLET | Freq: Two times a day (BID) | ORAL | Status: DC
  Administered 2019-04-14 – 2019-04-17 (×7): 1 mg
  Filled 2019-04-14 (×6): qty 1

## 2019-04-14 MED ORDER — CLONAZEPAM 0.1 MG/ML ORAL SUSPENSION: 1.0000 mg | Freq: Two times a day (BID) | ORAL | Status: DC

## 2019-04-14 MED ORDER — CLONAZEPAM 1 MG PO TABS
1.0000 mg | ORAL_TABLET | Freq: Two times a day (BID) | ORAL | Status: DC
  Filled 2019-04-14: qty 1

## 2019-04-14 MED ORDER — CLONAZEPAM 1 MG PO TABS: 1.0000 mg | ORAL_TABLET | Freq: Two times a day (BID) | ORAL | Status: DC

## 2019-04-14 NOTE — Progress Notes (Signed)
NAME:  [name], MRN:  [id number], DOB:  10-16-1945, LOS: 4 ADMISSION DATE:  03/31/2019, CONSULTATION DATE:  04/01/2019 REFERRING MD: Dr. Regenia Skeeter, CHIEF COMPLAINT: Acute encephalopathy  Brief History   74 y/o F, remote smoker, found down pm 3/20, subsequent intubation on arrival. MRI brain negative for acute process on admit.   Past Medical History  ESRD - on HD T,Th,S OSA - on CPAP  CHF  DM    Significant Hospital Events   03/02/2019 ER visit for arthritic type knee pain and history of fall with generalized weakness.  Right knee was swollen and warm.  Treated with as needed hydrocodone 03/30/2019 ER visit for generalized weakness, dizziness and vertigo following dialysis.Marland Kitchen  MRI negative. 04/05/2019 Hypoglycemia after dialysis.  Brought in confused and disoriented to the ER 3/20 Admit, after being found unresponsive, intubated in ER 3/22 Ongoing tremors with stimulation, unresponsive, not following commands 3/23 Failed PSV   Consults:  Neuro  Nephrology    Procedures:  ETT 3/20 >>  L IJ (ER) 3/20 >>  R Fem Aline 3/21 >>  3/22   Significant Diagnostic Tests:   MR Brain w/o contrast 3/20 >> No acute intracranial abnormality. Diffuse soft tissue swelling throughout the right frontoparietal and temporal scalp. Query recent trauma. 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 Underlying age-related cerebral atrophy with moderate chronic microvascular ischemic disease, with multiple additional remote lacunar infarcts as above. Diffuse mucosal thickening with scattered air-fluid levels seen throughout the paranasal sinuses.  EEG 3/21 >> gen slowing but no sz   ECHO/TTE 3/23 >>  Severe LAE, mild LVH/ Moderate AS with mean gradient now 21  Micro/ID  Data:  COVID 3/20 >> negative  Influenza A/B 3/20 >> negative MRSA PCR 3/21 >> negative  Tracheal aspirate 3/21 >> MSSA  BC x 2  3/21  >>   Antimicrobials:  Vanco 3/20 >> 3/21  Cefepime 3/20 >> 3/23 Cefazolin  3/23 >>  3/26 planned  Interim history/subjective:     Scheduled Meds: . aluminum hydroxide  30 mL Per Tube TID  . chlorhexidine gluconate (MEDLINE KIT)  15 mL Mouth Rinse BID  . Chlorhexidine Gluconate Cloth  6 each Topical Q0600  . Chlorhexidine Gluconate Cloth  6 each Topical Q0600  . feeding supplement (PRO-STAT SUGAR FREE 64)  60 mL Per Tube BID  . heparin  5,000 Units Subcutaneous Q8H  . ipratropium-albuterol  3 mL Nebulization Q6H  . mouth rinse  15 mL Mouth Rinse 10 times per day  . pantoprazole sodium  40 mg Per Tube Daily  . sodium chloride flush  10-40 mL Intracatheter Q12H   Continuous Infusions: . sodium chloride    . sodium chloride    .  ceFAZolin (ANCEF) IV Stopped (04/13/19 1902)  . feeding supplement (VITAL HIGH PROTEIN) 1,000 mL (04/13/19 1000)  . levETIRAcetam Stopped (04/14/19 0432)  . niCARDipine    . propofol (DIPRIVAN) infusion 20 mcg/kg/min (04/14/19 0626)   PRN Meds:.sodium chloride, sodium chloride, alteplase, fentaNYL (SUBLIMAZE) injection, heparin, hydrALAZINE, lidocaine (PF), lidocaine-prilocaine, midazolam, pentafluoroprop-tetrafluoroeth, sodium chloride flush   Objective   Blood pressure (!) 157/54, pulse 89, temperature 98.6 F (37 C), temperature source Axillary, resp. rate 17, weight 122.2 kg, SpO2 97 %.    Vent Mode: PRVC FiO2 (%):  [30 %] 30 % Set Rate:  [16 bmp] 16 bmp Vt Set:  [500 mL] 500 mL PEEP:  [5 cmH20] 5 cmH20 Pressure Support:  [8 cmH20] 8 cmH20 Plateau Pressure:  [17 cmH20-20 cmH20]  20 cmH20   Intake/Output Summary (Last 24 hours) at 04/14/2019 0846 Last data filed at 04/14/2019 0600 Gross per 24 hour  Intake 1336.13 ml  Output --  Net 1336.13 ml   Filed Weights   04/12/19 0715 04/13/19 0427 04/14/19 0420  Weight: 122.1 kg 122.8 kg 122.2 kg       Examination Tmax 98.7 Pt min resp to nox/ no resp to verbal or eye opening  Oropharynx et/ L naris FT  Neck supple Lungs with min  rhonchi bilaterally RRR no s3 or or  sign murmur Abd obese tol TF  Extr warm with trace edema    pCXR 3/25:  1.  Lines and tubes stable position. 2. Cardiomegaly with progressive diffuse bilateral interstitial prominence consistent with interstitial edema. Pneumonitis can not be excluded. Small left pleural effusion cannot be excluded.    Resolved Hospital Problem list    Acute Metabolic Encephalopathy  - w/u most c/w hypoxic encephalopathy -continue supportive care in ICU  -follow serial neuro checks  -appreciate Neurology assistance with patient care -goal to minimize sedation as much as able but vent dyssynchrony has been limiting factor  -continue keppra - add clonazepam 3/25 in effort to get off diprovan drip    Acute Respiratory Failure in setting of Acute Encephalopathy  Prior PFT's 2018 without airflow obstruction. Remote smoker but air traps on vent  -PRVC 8cc/kg -wean PEEP / FiO2 for sats >90% -daily SBT -PAD protocol for RASS Goal 0 to -1 -follow intermittent CXR  -duoneb Q6  MSSA PNA, Suspected Aspiration  Fever, Leukocytosis  -follow fever curve, CXR as above  -ancef per flow sheet - ancef stop date  3/26 planned = 7 days total rx    Chronic Diastolic CHF / moderate AS by echo 3/23 with worsening pulmonary edema 3/25  Hypertension Mild Troponin Elevation  -continue HD for volume removal  -SBP goal 140-160 -PRN cardene if necessary for BP control  -PRN hydralazine  - start monitoring cvp am 3/25 to guide volume status ? Help with weaning from vent   ESRD on TTS HD Missed HD prior to admit  -appreciate Nephrology assistance with patient care  -Trend BMP / urinary output -Avoid nephrotoxic agents, ensure adequate renal perfusion  Fluid and electrolyte imbalance: Hyperkalemia, Hypocalcemia, Hyperphosphatemia, Secondary hyperparathyroidism  -monitor and replace as indicated  Anemia    Lab Results  Component Value Date   HGB 9.8 (L) 04/14/2019   HGB 9.6 (L) 04/13/2019   HGB 9.5 (L)  04/12/2019   -trend CBC -transfuse for Hgb <7%  DM  Hx of hypoglycemic ER visit in 03/2019  -CBG Q4  -add SSI if glucose consistently >180  Incidental Finding of Enlarged Left Thyroid Lobe on admit CT 04/17/2019 >>>  moot point given neuro prognosis       At Risk Pressure Injury  -maximize nutrition  -pressure relief interventions   Best practice:  Diet: npo.  Tube feeds Pain/Anxiety/Delirium protocol (if indicated): in place VAP protocol (if indicated): HOB > 30 DVT prophylaxis: heparin sq GI prophylaxis: PPI Glucose control: CBG Q4  Mobility: BR Code Status: Full Code  Family Communication: daughter leaning toward one way extubation based on prior wishes of daughter as of 3/24 > will seek her out today at bedside to offer palliative care   The patient is critically ill with multiple organ systems failure and requires high complexity decision making for assessment and support, frequent evaluation and titration of therapies, application of advanced monitoring technologies and extensive interpretation  of multiple databases. Critical Care Time devoted to patient care services described in this note is 45 minutes.    Christinia Gully, MD [facility] [phone] After 6:00 PM or weekends, use Beeper [phone]  After 7:00 pm call Elink  [phone]

## 2019-04-14 NOTE — Progress Notes (Signed)
RN received a call from a third family member this morning to discuss the status of pt in 4N31. RN spoke with pt's granddaughter, Elmo Putt and gave an update for the plan of care for today.

## 2019-04-14 NOTE — Progress Notes (Signed)
Pt's daughter, Langley Gauss called to speak with RN. RN updated pt's daughter on the plan of care for today.

## 2019-04-14 NOTE — Progress Notes (Signed)
Patient ID: [name], female   DOB: 06-26-1945, 74 y.o.   MRN: [id number]  [facility] Progress Note   Assessment/ Plan:   1.  Acute encephalopathy: Likley anoxic.  Not improving.  Unfavorable neuro exam. Neuro following. On Keppra 2. ESRD: On THS schedule for HD today.   3. Anemia: Hb stable 9s.  CTM 4. 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 Nutrition: Continue tube feeds with protein supplementation. 6. Hypertension: Blood pressure intermittently elevated-possibly related to agitation/tremulousness.  We will continue to follow with hemodialysis. 7. Palliative / GOC: Agree with Ririe conversation wit hfaimly   Subjective:    No change, not interactive Fam mtg today to discuss trach/peg  Objective:   BP (!) 146/44   Pulse 94   Temp (!) 97.5 F (36.4 C) (Axillary)   Resp 18   Wt 122.2 kg   SpO2 96%   BMI 42.19 kg/m   Physical Exam: Gen: Intubated, appears comfortable.   CVS: Pulse regular tachycardia, S1 and S2 normal Resp: Anteriorly clear to auscultation, no distinct rales/rhonchi Abd: Soft, obese, nontender Ext: Trace left ankle edema, 1+ upper extremity edema.  LUA BCF with intact dressings  Labs: BMET Recent Labs  Lab 04/05/2019 2105 04/20/2019 2108 03/29/2019 2109 04/16/2019 2241 04/10/19 0525 04/10/19 1122 04/10/19 1517 04/11/19 0349 04/11/19 0353 04/11/19 1945 04/12/19 0413 04/12/19 2000 04/13/19 0331 04/14/19 0412  NA 141   < > 139   < > 142 139 142 138  --   --  138  --  135 137  K 6.6*   < > 6.2*   < > 5.5* 5.6* 6.1* 5.3*  --   --  5.1  --  4.8 4.9  CL 101  --  105  --  103  --  100  --   --   --  100  --  95* 95*  CO2 19*  --   --   --  22  --  22  --   --   --  25  --  27 26  GLUCOSE 186*  --  179*  --  106*  --  96  --   --   --  125*  --  116* 102*  BUN 81*  --  69*  --  86*  --  93*  --   --   --  83*  --  57* 81*  CREATININE 10.67*  --  11.50*  --  10.92*  --  11.73*  --   --   --  9.82*  --   6.70* 8.41*  CALCIUM 8.7*  --   --   --  9.0  --  9.1  --   --   --  8.4*  --  8.6* 9.4  PHOS  --   --   --   --  6.6*  --  6.8*  7.1*  --  7.6* 6.2* 7.3* 6.0* 7.0*  --    < > = values in this interval not displayed.   CBC Recent Labs  Lab 04/06/2019 2105 03/24/2019 2108 04/10/19 1517 04/10/19 1517 04/11/19 0349 04/12/19 0413 04/13/19 0331 04/14/19 0412  WBC 10.4   < > 14.2*  --   --  7.7 7.0 10.6*  NEUTROABS 8.0*  --   --   --   --   --   --   --   HGB 14.7   < >  11.9*   < > 10.5* 9.5* 9.6* 9.8*  HCT 46.5*   < > 37.2   < > 31.0* 29.5* 30.3* 31.2*  MCV 86.3   < > 85.9  --   --  86.5 86.1 86.0  PLT 222   < > 181  --   --  142* 154 190   < > = values in this interval not displayed.      Medications:    . aluminum hydroxide  30 mL Per Tube TID  . chlorhexidine gluconate (MEDLINE KIT)  15 mL Mouth Rinse BID  . Chlorhexidine Gluconate Cloth  6 each Topical Q0600  . Chlorhexidine Gluconate Cloth  6 each Topical Q0600  . clonazePAM  1 mg Per Tube BID  . feeding supplement (PRO-STAT SUGAR FREE 64)  60 mL Per Tube BID  . heparin  5,000 Units Subcutaneous Q8H  . ipratropium-albuterol  3 mL Nebulization Q6H  . mouth rinse  15 mL Mouth Rinse 10 times per day  . pantoprazole sodium  40 mg Per Tube Daily  . sodium chloride flush  10-40 mL Intracatheter Q12H   Rexene Agent , MD 04/14/2019, 10:39 AM

## 2019-04-14 NOTE — Progress Notes (Signed)
Neurology Progress Note   S:// Seen and examined.  Was on propofol total about 30 minutes prior to the examination. Was not on propofol during the time of the examination.  O:// Current vital signs: BP (!) 157/54   Pulse 89   Temp 98.6 F (37 C) (Axillary)   Resp 17   Wt 122.2 kg   SpO2 97%   BMI 42.19 kg/m  Vital signs in last 24 hours: Temp:  [98.6 F (37 C)-98.7 F (37.1 C)] 98.6 F (37 C) (03/25 0000) Pulse Rate:  [6-102] 89 (03/25 0800) Resp:  [0-25] 17 (03/25 0800) BP: (104-203)/(44-73) 157/54 (03/25 0800) SpO2:  [91 %-100 %] 97 % (03/25 0800) FiO2 (%):  [30 %] 30 % (03/25 0735) Weight:  [122.2 kg] 122.2 kg (03/25 0420) General: Sedated intubated with sedation held 30 minutes prior to the exam. HEENT: Normocephalic atraumatic CVS: Regular rate rhythm Respiratory: Bilateral rhonchi Abdomen: Obese, nontender Neurological exam Does not open eyes to voice or noxious stimulation Does not follow any commands Is not sedated-propofol was held 30 minutes before the exam. Remains intubated Is breathing over the ventilator Pupils are sluggishly reactive bilaterally Corneals are present bilaterally No spontaneous movements or movements to noxious stimulation in any of the extremities  Medications  Current Facility-Administered Medications:  .  0.9 %  sodium chloride infusion, 100 mL, Intravenous, PRN, Penninger, Lindsay, PA .  0.9 %  sodium chloride infusion, 100 mL, Intravenous, PRN, Penninger, Lindsay, PA .  alteplase (CATHFLO ACTIVASE) injection 2 mg, 2 mg, Intracatheter, Once PRN, Penninger, Lindsay, PA .  aluminum hydroxide (AMPHOJEL/ALTERNAGEL) suspension 30 mL, 30 mL, Per Tube, TID, Elmarie Shiley, MD, 30 mL at 04/13/19 2056 .  ceFAZolin (ANCEF) IVPB 1 g/50 mL premix, 1 g, Intravenous, Q24H, Ollis, Brandi L, NP, Stopped at 04/13/19 1902 .  chlorhexidine gluconate (MEDLINE KIT) (PERIDEX) 0.12 % solution 15 mL, 15 mL, Mouth Rinse, BID, Ramaswamy, Murali, MD, 15 mL at  04/14/19 0815 .  Chlorhexidine Gluconate Cloth 2 % PADS 6 each, 6 each, Topical, Q0600, Penninger, Lindsay, [place], 6 each at 04/13/19 [phone] .  Chlorhexidine Gluconate Cloth 2 % PADS 6 each, 6 each, Topical, Q0600, Roney Jaffe, MD, 6 each at 04/14/19 0400 .  feeding supplement (PRO-STAT SUGAR FREE 64) liquid 60 mL, 60 mL, Per Tube, BID, Brand Males, MD, 60 mL at 04/13/19 2050 .  feeding supplement (VITAL HIGH PROTEIN) liquid 1,000 mL, 1,000 mL, Per Tube, Continuous, Ramaswamy, Murali, MD, Last Rate: 35 mL/hr at 04/13/19 1000, 1,000 mL at 04/13/19 1000 .  fentaNYL (SUBLIMAZE) injection 50-200 mcg, 50-200 mcg, Intravenous, Q30 min PRN, Brand Males, MD, 100 mcg at 04/13/19 1521 .  heparin injection 1,000 Units, 1,000 Units, Dialysis, PRN, Penninger, Lindsay, PA .  heparin injection 5,000 Units, 5,000 Units, Subcutaneous, Q8H, Brand Males, MD, 5,000 Units at 04/14/19 0500 .  hydrALAZINE (APRESOLINE) injection 10-40 mg, 10-40 mg, Intravenous, Q4H PRN, Brand Males, MD, 20 mg at 04/14/19 0423 .  ipratropium-albuterol (DUONEB) 0.5-2.5 (3) MG/3ML nebulizer solution 3 mL, 3 mL, Nebulization, Q6H, Ramaswamy, Murali, MD, 3 mL at 04/14/19 0735 .  levETIRAcetam (KEPPRA) IVPB 500 mg/100 mL premix, 500 mg, Intravenous, Q12H, Aroor, Lanice Schwab, MD, Stopped at 04/14/19 0432 .  lidocaine (PF) (XYLOCAINE) 1 % injection 5 mL, 5 mL, Intradermal, PRN, Penninger, Lindsay, PA .  lidocaine-prilocaine (EMLA) cream 1 application, 1 application, Topical, PRN, Penninger, Lindsay, PA .  MEDLINE mouth rinse, 15 mL, Mouth Rinse, 10 times per day, Brand Males, MD, 15 mL at  04/14/19 0601 .  midazolam (VERSED) injection 1 mg, 1 mg, Intravenous, Q2H PRN, Brand Males, MD, 1 mg at 04/10/19 1550 .  nicardipine (CARDENE) 40m in 0.86% saline 205mIV infusion (0.1 mg/ml), 0-15 mg/hr, Intravenous, Continuous, Sood, Vineet, MD .  pantoprazole sodium (PROTONIX) 40 mg/20 mL oral suspension 40 mg, 40 mg, Per  Tube, Daily, Ramaswamy, Murali, MD, 40 mg at 04/13/19 1003 .  pentafluoroprop-tetrafluoroeth (GEBAUERS) aerosol 1 application, 1 application, Topical, PRN, Penninger, Lindsay, PA .  propofol (DIPRIVAN) 1000 MG/100ML infusion, 0-50 mcg/kg/min, Intravenous, Continuous, Ramaswamy, Murali, MD, Last Rate: 13.82 mL/hr at 04/14/19 0626, 20 mcg/kg/min at 04/14/19 0626 .  sodium chloride flush (NS) 0.9 % injection 10-40 mL, 10-40 mL, Intracatheter, Q12H, RaBrand MalesMD, 10 mL at 04/13/19 2052 .  sodium chloride flush (NS) 0.9 % injection 10-40 mL, 10-40 mL, Intracatheter, PRN, RaBrand MalesMD Labs CBC    Component Value Date/Time   WBC 10.6 (H) 04/14/2019 0412   RBC 3.63 (L) 04/14/2019 0412   HGB 9.8 (L) 04/14/2019 0412   HCT 31.2 (L) 04/14/2019 0412   PLT 190 04/14/2019 0412   MCV 86.0 04/14/2019 0412   MCH 27.0 04/14/2019 0412   MCHC 31.4 04/14/2019 0412   RDW 19.0 (H) 04/14/2019 0412   LYMPHSABS 1.4 04/12/2019 2105   MONOABS 0.9 03/28/2019 2105   EOSABS 0.0 04/20/2019 2105   BASOSABS 0.0 04/10/2019 2105    CMP     Component Value Date/Time   NA 137 04/14/2019 0412   K 4.9 04/14/2019 0412   CL 95 (L) 04/14/2019 0412   CO2 26 04/14/2019 0412   GLUCOSE 102 (H) 04/14/2019 0412   BUN 81 (H) 04/14/2019 0412   CREATININE 8.41 (H) 04/14/2019 0412   CREATININE 2.86 (H) 10/10/2010 1140   CALCIUM 9.4 04/14/2019 0412   CALCIUM 8.7 01/08/2010 1121   PROT 6.1 (L) 04/13/2019 0331   ALBUMIN 2.2 (L) 04/13/2019 0331   AST 14 (L) 04/13/2019 0331   ALT 13 04/13/2019 0331   ALKPHOS 43 04/13/2019 0331   BILITOT 0.6 04/13/2019 0331   GFRNONAA 4 (L) 04/14/2019 0412   GFRAA 5 (L) 04/14/2019 0412     Imaging I have reviewed images in epic and the results pertinent to this consultation are: CT and MRI of the head unremarkable for acute process.  Assessment: [age]ear old woman brought in after being found down and neurology consulted for encephalopathy with concern of possible and  witnessed seizure. Given old cortical infarcts, seizure remains in the realm of possibility and she was started on antiepileptics. Prolonged EEG did not show any indication of seizure activity even when she was having tremulous body movements-likely myoclonic movements secondary to metabolic derangements and hypoxia. Although MRI of the brain did not reveal any evidence of anoxic brain injury, generalized hypoxic brain damage cannot be ruled out just by 1 MRI-her clinical picture does look like a hypoxic ischemic encephalopathy. Her examination has remained poor off of sedation with no meaningful cortical response-she had some mild withdrawal in extremities before which I was not able to elicit today.  Impression: Multifactorial toxic metabolic encephalopathy with component of hypoxic ischemic encephalopathy   Recommendations: -I would continue Keppra -Supportive care per primary team It is safe to say that more number of days that pass without her showing any meaningful signs on neurological exam, make it less likely that she would have a favorable recovery in terms of neurological function.  I agree with the critical care team discussing this with the  family.  Neurology will continue to peripherally follow her clinical exam with you.  -- Amie Portland, MD [facility] Pager: [phone] If 7pm to 7am, please call on call as listed on AMION.  CRITICAL CARE ATTESTATION Performed by: Amie Portland, MD Total critical care time:  68mnutes Critical care time was exclusive of separately billable procedures and treating other patients and/or supervising APPs/Residents/Students Critical care was necessary to treat or prevent imminent or life-threatening deterioration due to multifactorial toxic metabolic encephalopathy. This patient is critically ill and at significant risk for neurological worsening and/or death and care requires constant monitoring. Critical care was time spent  personally by me on the following activities: development of treatment plan with patient and/or surrogate as well as nursing, discussions with consultants, evaluation of patient's response to treatment, examination of patient, obtaining history from patient or surrogate, ordering and performing treatments and interventions, ordering and review of laboratory studies, ordering and review of radiographic studies, pulse oximetry, re-evaluation of patient's condition, participation in multidisciplinary rounds and medical decision making of high complexity in the care of this patient.

## 2019-04-14 NOTE — Progress Notes (Signed)
RN spoke with pt's daughter, [name], who called for an updated status on her mother. [name] replied that she is the designated visitor and will be on the unit today after 1:30 pm to discuss the goals of care with CCM MD at that time. RN will facilitate a call to MD when pt's daughter arrives on unit.

## 2019-04-15 LAB — GLUCOSE, CAPILLARY
Glucose-Capillary: 95 mg/dL (ref 70–99)
Glucose-Capillary: 95 mg/dL (ref 70–99)
Glucose-Capillary: 103 mg/dL — ABNORMAL HIGH (ref 70–99)
Glucose-Capillary: 89 mg/dL (ref 70–99)
Glucose-Capillary: 104 mg/dL — ABNORMAL HIGH (ref 70–99)
Glucose-Capillary: 112 mg/dL — ABNORMAL HIGH (ref 70–99)

## 2019-04-15 LAB — CULTURE, BLOOD (ROUTINE X 2)
Culture: NO GROWTH
Culture: NO GROWTH

## 2019-04-15 LAB — TRIGLYCERIDES: Triglycerides: 100 mg/dL (ref <150)

## 2019-04-15 MED ORDER — CLONIDINE ORAL SUSPENSION 10 MCG/ML: 0.2000 mg | Freq: Three times a day (TID) | ORAL | Status: DC

## 2019-04-15 MED ORDER — CLONIDINE HCL 0.2 MG PO TABS
0.2000 mg | ORAL_TABLET | Freq: Three times a day (TID) | ORAL | Status: DC
  Administered 2019-04-15 – 2019-04-17 (×8): 0.2 mg
  Filled 2019-04-15 (×8): qty 1

## 2019-04-15 MED ORDER — SODIUM CHLORIDE 0.9 % IV SOLN
INTRAVENOUS | Status: DC | PRN
  Administered 2019-04-15: 250 mL via INTRAVENOUS

## 2019-04-15 NOTE — Progress Notes (Signed)
NAME:  [name], MRN:  [id number], DOB:  07-22-1945, LOS: 5 ADMISSION DATE:  04/20/2019, CONSULTATION DATE:  03/29/2019 REFERRING MD: Dr. Regenia Skeeter, CHIEF COMPLAINT: Acute encephalopathy  Brief History   74 y/o F, remote smoker, found down pm 3/20, subsequent intubation on arrival. MRI brain negative for acute process on admit with presumed HIE though never arrested.   Past Medical History  ESRD - on HD T,Th,S OSA - on CPAP  CHF  DM    Significant Hospital Events   03/02/2019 ER visit for arthritic type knee pain and history of fall with generalized weakness.  Right knee was swollen and warm.  Treated with as needed hydrocodone 03/30/2019 ER visit for generalized weakness, dizziness and vertigo following dialysis.[facility] Kitchen  MRI negative. 04/05/2019 Hypoglycemia after dialysis.  Brought in confused and disoriented to the ER 3/20 Admit, after being found unresponsive, intubated in ER 3/22 Ongoing tremors with stimulation, unresponsive, not following commands 3/23 Failed PSV   Consults:  Neuro  Nephrology    Procedures:  ETT 3/20 >>  L IJ (ER) 3/20 >>  R Fem Aline 3/21 >>  3/22   Significant Diagnostic Tests:   MR Brain w/o contrast 3/20 >> No acute intracranial abnormality. Diffuse soft tissue swelling throughout the right frontoparietal and temporal scalp. Query recent trauma. 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 Underlying age-related cerebral atrophy with moderate chronic microvascular ischemic disease, with multiple additional remote lacunar infarcts as above. Diffuse mucosal thickening with scattered air-fluid levels seen throughout the paranasal sinuses.  EEG 3/21 >> gen slowing but no sz   ECHO/TTE 3/23 >>  Severe LAE, mild LVH/ Moderate AS with mean gradient now 21  Micro/ID  Data:  COVID 3/20 >> negative  Influenza A/B 3/20 >> negative MRSA PCR 3/21 >> negative  Tracheal aspirate 3/21 >> MSSA  BC x 2  3/21  >> neg   Antimicrobials:  Vanco 3/20 >>  3/21  Cefepime 3/20 >> 3/23 Cefazolin 3/23 >>  3/26  (completed 7 days effective rx for MSSA)   Interim history/subjective:     Scheduled Meds: . aluminum hydroxide  30 mL Per Tube TID  . chlorhexidine gluconate (MEDLINE KIT)  15 mL Mouth Rinse BID  . Chlorhexidine Gluconate Cloth  6 each Topical Q0600  . Chlorhexidine Gluconate Cloth  6 each Topical Q0600  . clonazePAM  1 mg Per Tube BID  . cloNIDine  0.2 mg Oral TID  . feeding supplement (PRO-STAT SUGAR FREE 64)  60 mL Per Tube BID  . heparin  5,000 Units Subcutaneous Q8H  . ipratropium-albuterol  3 mL Nebulization Q6H  . mouth rinse  15 mL Mouth Rinse 10 times per day  . pantoprazole sodium  40 mg Per Tube Daily  . sodium chloride flush  10-40 mL Intracatheter Q12H   Continuous Infusions: . sodium chloride    .  ceFAZolin (ANCEF) IV Stopped (04/14/19 1750)  . feeding supplement (VITAL HIGH PROTEIN) 35 mL/hr at 04/15/19 0800  . levETIRAcetam Stopped (04/15/19 0521)  . propofol (DIPRIVAN) infusion Stopped (04/15/19 0735)   PRN Meds:.sodium chloride, alteplase, fentaNYL (SUBLIMAZE) injection, heparin, hydrALAZINE, lidocaine (PF), lidocaine-prilocaine, midazolam, pentafluoroprop-tetrafluoroeth, sodium chloride flush   Objective   Blood pressure (!) 171/142, pulse 88, temperature 97.6 F (36.4 C), temperature source Axillary, resp. rate 18, weight 113 kg, SpO2 96 %. CVP:  [3 mmHg-17 mmHg] 7 mmHg  Vent Mode: PRVC FiO2 (%):  [30 %] 30 % Set Rate:  [16 bmp] 16 bmp Vt Set:  [  500 mL] 500 mL PEEP:  [5 cmH20] 5 cmH20 Pressure Support:  [10 cmH20] 10 cmH20 Plateau Pressure:  [17 cmH20-19 cmH20] 19 cmH20   Intake/Output Summary (Last 24 hours) at 04/15/2019 0905 Last data filed at 04/15/2019 0800 Gross per 24 hour  Intake 1174.97 ml  Output 2700 ml  Net -1525.03 ml   Filed Weights   04/14/19 0420 04/14/19 1655 04/15/19 0500  Weight: 122.2 kg 118 kg 113 kg   CVP:  [3 mmHg-17 mmHg] 7 mmHg   Examination Tmax 98.8 (no change)     Pt not breathing over vent / 30 degrees hob No jvd/ cvp 7 noted Oropharynx et/ L Naris FT  Neck supple Lungs with a few scattered exp > insp rhonchi bilaterally RRR no s3   II/VI SEM  Abd obese limited  excursion / tf going ok  Extr warm with trace edema/ pas in place  Neuro does not track with eyes/ minimal posturing with nox stimuli         Resolved Hospital Problem list    Acute Metabolic Encephalopathy  - w/u most c/w hypoxic encephalopathy -continue supportive care in ICU  -follow serial neuro checks   -goal to minimize sedation as much as able but vent dyssynchrony has been limiting factor > trying to use clonazepam/ clonidine and prn fentanyl and avoid diprovan -  keppra per neuro  - added clonidine am 3/26 as also hypertensive    Acute Respiratory Failure in setting of Acute Encephalopathy  Prior PFT's 2018 without airflow obstruction. Remote smoker but air traps on vent  -PRVC 8cc/kg -wean PEEP / FiO2 for sats >90% -daily SBT -PAD protocol for RASS Goal 0 to -1 -follow intermittent CXR  -duoneb Q6 >>>> reduce backup rate 3/26 so she sets her own and does not go "apneic" when changed to PSV  >>>> trach vs one way extubation only options here  MSSA PNA, Suspected Aspiration  Fever, Leukocytosis  -follow fever curve, CXR as above  -ancef per flow sheet  D/c'd   3/26 7 days total effective rx complete     Chronic Diastolic CHF / moderate AS by echo 3/23 with worsening pulmonary edema 3/25  Hypertension Mild Troponin Elevation  -continue HD for volume removal  -SBP goal 140-160  - CVP at only 7 p HD 3/25  >>> added clonidine am 3/26 to prn hydralazine      ESRD on TTS HD Missed HD prior to admit    >>> continue HD tiw per renal unless shift to palliative approach  Fluid and electrolyte imbalance: Hyperkalemia, Hypocalcemia, Hyperphosphatemia, Secondary hyperparathyroidism  -monitor and replace as indicated per renal   Anemia    Lab Results   Component Value Date   HGB 9.8 (L) 04/14/2019   HGB 9.6 (L) 04/13/2019   HGB 9.5 (L) 04/12/2019   -trend CBC -transfuse for Hgb <7%  DM  Hx of hypoglycemic ER visit in 03/2019  -CBG Q4  -add SSI if glucose consistently >180  Incidental Finding of Enlarged Left Thyroid Lobe on admit CT 04/01/2019 >>>  moot point given neuro prognosis       At Risk Pressure Injury  -maximize nutrition  -pressure relief interventions   Best practice:  Diet: npo.  Tube feeds Pain/Anxiety/Delirium protocol (if indicated): in place VAP protocol (if indicated): HOB > 30 DVT prophylaxis: heparin sq/ pas GI prophylaxis: PPI Glucose control: CBG Q4  Mobility: BR Code Status: Full Code  Family Communication: daughter at bedside pm 3/25 convinced mother  is tracking her with eyes and having a hard time coming to decision re one way extubation vs trach/LTACH    The patient is critically ill with multiple organ systems failure and requires high complexity decision making for assessment and support, frequent evaluation and titration of therapies, application of advanced monitoring technologies and extensive interpretation of multiple databases. Critical Care Time devoted to patient care services described in this note is 35 minutes.    Christinia Gully, MD Pulmonary and [place] [phone] After 6:00 PM or weekends, use Beeper [phone]  After 7:00 pm call Elink  [phone]

## 2019-04-15 NOTE — Progress Notes (Signed)
Neurology Progress Note   S:// Seen and examined.  Not on sedation for the exam  O:// Current vital signs: BP 138/75   Pulse 75   Temp 97.6 F (36.4 C) (Axillary)   Resp 16   Wt 113 kg   SpO2 98%   BMI 39.02 kg/m  Vital signs in last 24 hours: Temp:  [97.6 F (36.4 C)-98.8 F (37.1 C)] 97.6 F (36.4 C) (03/26 0800) Pulse Rate:  [75-106] 75 (03/26 0700) Resp:  [16-24] 16 (03/26 0700) BP: (94-180)/(26-79) 138/75 (03/26 0700) SpO2:  [94 %-100 %] 98 % (03/26 0731) FiO2 (%):  [30 %] 30 % (03/26 0731) Weight:  [599 kg-118 kg] 113 kg (03/26 0500) General: Intubated, sedated for vent dyssynchrony off and on overnight.  Not on sedation for this exam HEENT: Normocephalic atraumatic CVS: Regular rate rhythm Respiratory: Bilateral rhonchi Abdomen: Obese, nontender Neurological exam Does not open eyes to voice or noxious stimulation Does not follow any commands Remains intubated Is breathing over the ventilator Pupils are sluggishly reactive bilaterally Corneals are present bilaterally No spontaneous movements in any of the extremities. To noxious stimulation today, does exhibit minimal triple flexion in the left lower extremity and possible mild withdrawal in the right lower extremity.   Medications  Current Facility-Administered Medications:  .  0.9 %  sodium chloride infusion, 100 mL, Intravenous, PRN, Penninger, Lindsay, PA .  0.9 %  sodium chloride infusion, 100 mL, Intravenous, PRN, Penninger, Lindsay, PA .  alteplase (CATHFLO ACTIVASE) injection 2 mg, 2 mg, Intracatheter, Once PRN, Penninger, Lindsay, PA .  aluminum hydroxide (AMPHOJEL/ALTERNAGEL) suspension 30 mL, 30 mL, Per Tube, TID, Elmarie Shiley, MD, 30 mL at 04/14/19 2205 .  ceFAZolin (ANCEF) IVPB 1 g/50 mL premix, 1 g, Intravenous, Q24H, Ollis, Brandi L, NP, Stopped at 04/14/19 1750 .  chlorhexidine gluconate (MEDLINE KIT) (PERIDEX) 0.12 % solution 15 mL, 15 mL, Mouth Rinse, BID, Ramaswamy, Murali, MD, 15 mL at  04/15/19 0743 .  Chlorhexidine Gluconate Cloth 2 % PADS 6 each, 6 each, Topical, Q0600, Penninger, Lindsay, [place], 6 each at 04/13/19 [id number] .  Chlorhexidine Gluconate Cloth 2 % PADS 6 each, 6 each, Topical, Q0600, Roney Jaffe, MD, 6 each at 04/14/19 0400 .  clonazePAM (KLONOPIN) tablet 1 mg, 1 mg, Per Tube, BID, Tanda Rockers, MD, 1 mg at 04/14/19 2205 .  feeding supplement (PRO-STAT SUGAR FREE 64) liquid 60 mL, 60 mL, Per Tube, BID, Brand Males, MD, 60 mL at 04/14/19 2205 .  feeding supplement (VITAL HIGH PROTEIN) liquid 1,000 mL, 1,000 mL, Per Tube, Continuous, Ramaswamy, Murali, MD, Last Rate: 35 mL/hr at 04/14/19 1800, Rate Verify at 04/14/19 1800 .  fentaNYL (SUBLIMAZE) injection 50-200 mcg, 50-200 mcg, Intravenous, Q30 min PRN, Brand Males, MD, 100 mcg at 04/14/19 1558 .  heparin injection 1,000 Units, 1,000 Units, Dialysis, PRN, Penninger, Lindsay, PA .  heparin injection 5,000 Units, 5,000 Units, Subcutaneous, Q8H, Brand Males, MD, 5,000 Units at 04/15/19 0541 .  hydrALAZINE (APRESOLINE) injection 10-40 mg, 10-40 mg, Intravenous, Q4H PRN, Brand Males, MD, 20 mg at 04/14/19 0423 .  ipratropium-albuterol (DUONEB) 0.5-2.5 (3) MG/3ML nebulizer solution 3 mL, 3 mL, Nebulization, Q6H, Ramaswamy, Murali, MD, 3 mL at 04/15/19 0731 .  levETIRAcetam (KEPPRA) IVPB 500 mg/100 mL premix, 500 mg, Intravenous, Q12H, Aroor, Lanice Schwab, MD, Stopped at 04/15/19 0521 .  lidocaine (PF) (XYLOCAINE) 1 % injection 5 mL, 5 mL, Intradermal, PRN, Penninger, Lindsay, PA .  lidocaine-prilocaine (EMLA) cream 1 application, 1 application, Topical, PRN, Penninger, Ria Comment,  PA .  MEDLINE mouth rinse, 15 mL, Mouth Rinse, 10 times per day, Brand Males, MD, 15 mL at 04/15/19 0408 .  midazolam (VERSED) injection 1 mg, 1 mg, Intravenous, Q2H PRN, Brand Males, MD, 1 mg at 04/10/19 1550 .  nicardipine (CARDENE) 32m in 0.86% saline 2030mIV infusion (0.1 mg/ml), 0-15 mg/hr, Intravenous,  Continuous, Sood, Vineet, MD .  pantoprazole sodium (PROTONIX) 40 mg/20 mL oral suspension 40 mg, 40 mg, Per Tube, Daily, Ramaswamy, Murali, MD, 40 mg at 04/14/19 1047 .  pentafluoroprop-tetrafluoroeth (GEBAUERS) aerosol 1 application, 1 application, Topical, PRN, Penninger, Lindsay, PA .  propofol (DIPRIVAN) 1000 MG/100ML infusion, 0-50 mcg/kg/min, Intravenous, Continuous, Ramaswamy, Murali, MD, Last Rate: 13.82 mL/hr at 04/15/19 0700, 20 mcg/kg/min at 04/15/19 0700 .  sodium chloride flush (NS) 0.9 % injection 10-40 mL, 10-40 mL, Intracatheter, Q12H, Ramaswamy, Murali, MD, 10 mL at 04/14/19 2231 .  sodium chloride flush (NS) 0.9 % injection 10-40 mL, 10-40 mL, Intracatheter, PRN, RaBrand MalesMD Labs CBC    Component Value Date/Time   WBC 10.6 (H) 04/14/2019 0412   RBC 3.63 (L) 04/14/2019 0412   HGB 9.8 (L) 04/14/2019 0412   HCT 31.2 (L) 04/14/2019 0412   PLT 190 04/14/2019 0412   MCV 86.0 04/14/2019 0412   MCH 27.0 04/14/2019 0412   MCHC 31.4 04/14/2019 0412   RDW 19.0 (H) 04/14/2019 0412   LYMPHSABS 1.4 04/04/2019 2105   MONOABS 0.9 04/13/2019 2105   EOSABS 0.0 04/17/2019 2105   BASOSABS 0.0 03/29/2019 2105    CMP     Component Value Date/Time   NA 137 04/14/2019 0412   K 4.9 04/14/2019 0412   CL 95 (L) 04/14/2019 0412   CO2 26 04/14/2019 0412   GLUCOSE 102 (H) 04/14/2019 0412   BUN 81 (H) 04/14/2019 0412   CREATININE 8.41 (H) 04/14/2019 0412   CREATININE 2.86 (H) 10/10/2010 1140   CALCIUM 9.4 04/14/2019 0412   CALCIUM 8.7 01/08/2010 1121   PROT 6.1 (L) 04/13/2019 0331   ALBUMIN 2.2 (L) 04/13/2019 0331   AST 14 (L) 04/13/2019 0331   ALT 13 04/13/2019 0331   ALKPHOS 43 04/13/2019 0331   BILITOT 0.6 04/13/2019 0331   GFRNONAA 4 (L) 04/14/2019 0412   GFRAA 5 (L) 04/14/2019 0412     Imaging I have reviewed images in epic and the results pertinent to this consultation are: CT and MRI of the head unremarkable for acute process.  MRI did not reveal any evidence  of a global anoxic process  Assessment: [id number] old woman brought in after being found down and neurology consulted for encephalopathy with concern of possible and witnessed seizure. Given old cortical infarcts, seizure remains in the realm of possibility and she was started on antiepileptics. Prolonged EEG did not show any indication of seizure activity even when she was having tremulous body movements-likely myoclonic movements secondary to metabolic derangements and hypoxia.  This was caught on the EEG and had no EEG correlate. Although MRI of the brain did not reveal any evidence of anoxic brain injury, generalized hypoxic brain damage cannot be ruled out just by 1 MRI-her clinical picture does look like a hypoxic ischemic encephalopathy. Her examination has remained poor off of sedation with no meaningful cortical response-at best I have been able to elicit off-and-on mild withdrawal in the lower extremity  Impression: Multifactorial toxic metabolic encephalopathy with component of hypoxic ischemic encephalopathy/anoxic brain injury.   Recommendations: -I would continue Keppra -Supportive care per primary team It is safe to  say that more number of days that pass without her showing any meaningful signs on neurological exam, make it less likely that she would have a favorable recovery in terms of neurological function.  I agree with the critical care team discussing this with the family.  I will be available for any family discussions going forward.  Please feel free to call me.  -- Amie Portland, MD [facility] Pager: [phone] If 7pm to 7am, please call on call as listed on AMION.  CRITICAL CARE ATTESTATION Performed by: Amie Portland, MD Total critical care time:30 minutes Critical care time was exclusive of separately billable procedures and treating other patients and/or supervising APPs/Residents/Students Critical care was necessary to treat or prevent imminent or  life-threatening deterioration due to multifactorial toxic metabolic encephalopathy, hypoxic ischemic encephalopathy.  This patient is critically ill and at significant risk for neurological worsening and/or death and care requires constant monitoring. Critical care was time spent personally by me on the following activities: development of treatment plan with patient and/or surrogate as well as nursing, discussions with consultants, evaluation of patient's response to treatment, examination of patient, obtaining history from patient or surrogate, ordering and performing treatments and interventions, ordering and review of laboratory studies, ordering and review of radiographic studies, pulse oximetry, re-evaluation of patient's condition, participation in multidisciplinary rounds and medical decision making of high complexity in the care of this patient.

## 2019-04-15 NOTE — Progress Notes (Signed)
Patient ID: Myrtis Ser, female   DOB: [birth year], 74 y.o.   MRN: [id number]  [facility] Progress Note   Assessment/ Plan:   1.  Acute encephalopathy: Likley anoxic.  Not improving.  Unfavorable neuro exam. Neuro following. On Keppra 2. ESRD: On THS schedule: 3.5h,  2K, AVF 400/800, no  heparin 3. Anemia: Hb stable 9s.  CTM 4. 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 Nutrition: tube feeds with protein supplementation. 6. Hypertension: Stable 7. Palliative / GOC: Agree with [place] conversation with faimly   Subjective:    No change in neuro status, not interactive; grim prognosis GOC still being discussed HD yesterday: 2.7L  Objective:   BP (!) 98/45   Pulse 91   Temp 97.6 F (36.4 C) (Axillary)   Resp 15   Wt 113 kg   SpO2 96%   BMI 39.02 kg/m   Physical Exam: Gen: Intubated   CVS: Pulse regular nl rate, S1 and S2 normal Resp: Anteriorly clear to auscultation, no distinct rales/rhonchi Abd: Soft, obese, nontender Ext: Trace left ankle edema, 1+ upper extremity edema.  LUA BCF +B/T  Labs: BMET Recent Labs  Lab 04/03/2019 2105 04/06/2019 2108 03/27/2019 2109 04/08/2019 2241 04/10/19 0525 04/10/19 1122 04/10/19 1517 04/11/19 0349 04/11/19 0353 04/11/19 1945 04/12/19 0413 04/12/19 2000 04/13/19 0331 04/14/19 0412  NA 141   < > 139   < > 142 139 142 138  --   --  138  --  135 137  K 6.6*   < > 6.2*   < > 5.5* 5.6* 6.1* 5.3*  --   --  5.1  --  4.8 4.9  CL 101  --  105  --  103  --  100  --   --   --  100  --  95* 95*  CO2 19*  --   --   --  22  --  22  --   --   --  25  --  27 26  GLUCOSE 186*  --  179*  --  106*  --  96  --   --   --  125*  --  116* 102*  BUN 81*  --  69*  --  86*  --  93*  --   --   --  83*  --  57* 81*  CREATININE 10.67*  --  11.50*  --  10.92*  --  11.73*  --   --   --  9.82*  --  6.70* 8.41*  CALCIUM 8.7*  --   --   --  9.0  --  9.1  --   --   --  8.4*  --  8.6* 9.4  PHOS  --   --   --   --  6.6*   --  6.8*  7.1*  --  7.6* 6.2* 7.3* 6.0* 7.0*  --    < > = values in this interval not displayed.   CBC Recent Labs  Lab 03/23/2019 2105 03/21/2019 2108 04/10/19 1517 04/10/19 1517 04/11/19 0349 04/12/19 0413 04/13/19 0331 04/14/19 0412  WBC 10.4   < > 14.2*  --   --  7.7 7.0 10.6*  NEUTROABS 8.0*  --   --   --   --   --   --   --   HGB 14.7   < > 11.9*   < > 10.5* 9.5* 9.6* 9.8*  HCT 46.5*   < > 37.2   < > 31.0* 29.5* 30.3* 31.2*  MCV 86.3   < > 85.9  --   --  86.5 86.1 86.0  PLT 222   < > 181  --   --  142* 154 190   < > = values in this interval not displayed.      Medications:    . aluminum hydroxide  30 mL Per Tube TID  . chlorhexidine gluconate (MEDLINE KIT)  15 mL Mouth Rinse BID  . Chlorhexidine Gluconate Cloth  6 each Topical Q0600  . Chlorhexidine Gluconate Cloth  6 each Topical Q0600  . clonazePAM  1 mg Per Tube BID  . cloNIDine  0.2 mg Per Tube TID  . feeding supplement (PRO-STAT SUGAR FREE 64)  60 mL Per Tube BID  . heparin  5,000 Units Subcutaneous Q8H  . ipratropium-albuterol  3 mL Nebulization Q6H  . mouth rinse  15 mL Mouth Rinse 10 times per day  . pantoprazole sodium  40 mg Per Tube Daily  . sodium chloride flush  10-40 mL Intracatheter Q12H   Rexene Agent , MD 04/15/2019, 11:50 AM

## 2019-04-15 NOTE — Progress Notes (Signed)
DNR paperwork sent to unit from pt's dialysis center at the request of pt's daughter, [name]. CCM MD was notified and will consult palliative care team to further discuss the goals of care with the pt's family.

## 2019-04-16 LAB — CBC
HCT: 26.5 % — ABNORMAL LOW (ref 36.0–46.0)
Hemoglobin: 8.5 g/dL — ABNORMAL LOW (ref 12.0–15.0)
MCH: 27.7 pg (ref 26.0–34.0)
MCHC: 32.1 g/dL (ref 30.0–36.0)
MCV: 86.3 fL (ref 80.0–100.0)
Platelets: 212 10*3/uL (ref 150–400)
RBC: 3.07 MIL/uL — ABNORMAL LOW (ref 3.87–5.11)
RDW: 19.2 % — ABNORMAL HIGH (ref 11.5–15.5)
WBC: 7.4 10*3/uL (ref 4.0–10.5)
nRBC: 0 % (ref 0.0–0.2)

## 2019-04-16 LAB — RENAL FUNCTION PANEL
Albumin: 2.2 g/dL — ABNORMAL LOW (ref 3.5–5.0)
Anion gap: 16 — ABNORMAL HIGH (ref 5–15)
BUN: 106 mg/dL — ABNORMAL HIGH (ref 8–23)
CO2: 25 mmol/L (ref 22–32)
Calcium: 9.5 mg/dL (ref 8.9–10.3)
Chloride: 96 mmol/L — ABNORMAL LOW (ref 98–111)
Creatinine, Ser: 8.47 mg/dL — ABNORMAL HIGH (ref 0.44–1.00)
GFR calc Af Amer: 5 mL/min — ABNORMAL LOW (ref >60)
GFR calc non Af Amer: 4 mL/min — ABNORMAL LOW (ref >60)
Glucose, Bld: 94 mg/dL (ref 70–99)
Phosphorus: 8.7 mg/dL — ABNORMAL HIGH (ref 2.5–4.6)
Potassium: 5.1 mmol/L (ref 3.5–5.1)
Sodium: 137 mmol/L (ref 135–145)

## 2019-04-16 LAB — GLUCOSE, CAPILLARY
Glucose-Capillary: 89 mg/dL (ref 70–99)
Glucose-Capillary: 92 mg/dL (ref 70–99)
Glucose-Capillary: 85 mg/dL (ref 70–99)
Glucose-Capillary: 84 mg/dL (ref 70–99)
Glucose-Capillary: 88 mg/dL (ref 70–99)
Glucose-Capillary: 109 mg/dL — ABNORMAL HIGH (ref 70–99)

## 2019-04-16 LAB — TRIGLYCERIDES: Triglycerides: 88 mg/dL (ref <150)

## 2019-04-16 NOTE — Progress Notes (Signed)
 NAME:  [name], MRN:  [id number], DOB:  02/05/1945, LOS: 6 ADMISSION DATE:  03/22/2019, CONSULTATION DATE:  04/11/2019 REFERRING MD: Dr. Goldston, CHIEF COMPLAINT: Acute encephalopathy  Brief History   74 y/o F, remote smoker, found down pm 3/20, subsequent intubation on arrival. MRI brain negative for acute process on admit with presumed HIE though never arrested.   Past Medical History  ESRD - on HD T,Th,S OSA - on CPAP  CHF  DM    Significant Hospital Events   03/02/2019 ER visit for arthritic type knee pain and history of fall with generalized weakness.  Right knee was swollen and warm.  Treated with as needed hydrocodone 03/30/2019 ER visit for generalized weakness, dizziness and vertigo following dialysis..  MRI negative. 04/05/2019 Hypoglycemia after dialysis.  Brought in confused and disoriented to the ER 3/20 Admit, after being found unresponsive, intubated in ER 3/22 Ongoing tremors with stimulation, unresponsive, not following commands 3/23 Failed PSV   Consults:  Neuro  Nephrology    Procedures:  ETT 3/20 >>  L IJ (ER) 3/20 >>  R Fem Aline 3/21 >>  3/22   Significant Diagnostic Tests:   MR Brain w/o contrast 3/20 >> No acute intracranial abnormality. Diffuse soft tissue swelling throughout the right frontoparietal and temporal scalp. Query recent trauma. 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 Underlying age-related cerebral atrophy with moderate chronic microvascular ischemic disease, with multiple additional remote lacunar infarcts as above. Diffuse mucosal thickening with scattered air-fluid levels seen throughout the paranasal sinuses.  EEG 3/21 >> gen slowing but no sz   ECHO/TTE 3/23 >>  Severe LAE, mild LVH/ Moderate AS with mean gradient now 21  Micro/ID  Data:  COVID 3/20 >> negative  Influenza A/B 3/20 >> negative MRSA PCR 3/21 >> negative  Tracheal aspirate 3/21 >> MSSA  BC x 2  3/21  >> neg   Antimicrobials:  Vanco 3/20 >>  3/21  Cefepime 3/20 >> 3/23 Cefazolin 3/23 >>  3/26  (completed 7 days effective rx for MSSA)   Interim history/subjective:     Scheduled Meds: . chlorhexidine gluconate (MEDLINE KIT)  15 mL Mouth Rinse BID  . Chlorhexidine Gluconate Cloth  6 each Topical Q0600  . Chlorhexidine Gluconate Cloth  6 each Topical Q0600  . clonazePAM  1 mg Per Tube BID  . cloNIDine  0.2 mg Per Tube TID  . feeding supplement (PRO-STAT SUGAR FREE 64)  60 mL Per Tube BID  . heparin  5,000 Units Subcutaneous Q8H  . ipratropium-albuterol  3 mL Nebulization Q6H  . mouth rinse  15 mL Mouth Rinse 10 times per day  . pantoprazole sodium  40 mg Per Tube Daily  . sodium chloride flush  10-40 mL Intracatheter Q12H   Continuous Infusions: . sodium chloride    . sodium chloride 10 mL/hr at 04/16/19 1000  . feeding supplement (VITAL HIGH PROTEIN) 35 mL/hr at 04/15/19 1900  . levETIRAcetam Stopped (04/16/19 0422)  . propofol (DIPRIVAN) infusion Stopped (04/15/19 0848)   PRN Meds:.sodium chloride, sodium chloride, alteplase, fentaNYL (SUBLIMAZE) injection, heparin, hydrALAZINE, lidocaine (PF), lidocaine-prilocaine, midazolam, pentafluoroprop-tetrafluoroeth, sodium chloride flush   Objective   Blood pressure (!) 118/49, pulse 80, temperature 98.2 F (36.8 C), temperature source Axillary, resp. rate 16, weight 113 kg, SpO2 96 %.    Vent Mode: PRVC FiO2 (%):  [30 %] 30 % Set Rate:  [12 bmp] 12 bmp Vt Set:  [500 mL] 500 mL PEEP:  [5 cmH20] 5 cmH20 Plateau   Pressure:  [15 cmH20-20 cmH20] 15 cmH20   Intake/Output Summary (Last 24 hours) at 04/16/2019 1037 Last data filed at 04/16/2019 1000 Gross per 24 hour  Intake 1237.35 ml  Output --  Net 1237.35 ml   Filed Weights   04/14/19 0420 04/14/19 1655 04/15/19 0500  Weight: 122.2 kg 118 kg 113 kg      GEN: unresponsive woman on vent HEENT: ETT in place, has tongue swelling CV: RRR, ext warm, +SEM PULM: Scattered rhonci, triggering vent GI: Soft, +BS EXT:  Trace anasarca NEURO: not really doing anything to my noxious stimuli, GCS3 PSYCH: cannot assess SKIN: No rashes   Resolved Hospital Problem list    Acute Metabolic Encephalopathy - multifactorial but there is a strong suspicion for irreversible anoxic injury. - Daughter coming in today, need to figure out exactly what her expectations are going forward.  Choices are trach/PEG/LTACH (with placement being in another state due to ESRD) or allowing Ms. Stegenga to pass in peace  Vent dependent respiratory failure- continue vent  Continue supportive care in interim, see orders.  Erskine Emery MD PCCM

## 2019-04-16 NOTE — Progress Notes (Signed)
RN just spoke with pt's daughter, [name], over the phone. [name] has had long discussions with her family about her mother's wishes and feels that her mother would not like a tracheostomy nor to be placed in an LTAC. [name] states that she would like for comfort care measures to be in place and for a one-way extubation to happen tomorrow afternoon while she and her family members are at the bedside. RN will communicate the family's wishes with CCM MD.

## 2019-04-16 NOTE — Progress Notes (Signed)
Patient ID: [name], female   DOB: 10/23/1945, 74 y.o.   MRN: [id number]   [facility] Progress Note   Assessment/ Plan:   1.  Acute encephalopathy: Likley anoxic.  Not improving.  Unfavorable neuro exam. Neuro following. On Keppra 2. ESRD: On THS schedule: 3.5h,  2K, AVF 400/800, no  heparin 3. Anemia: Hb stable 9s.  CTM 4. 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 Nutrition: tube feeds with protein supplementation. 6. Hypertension: Stable 7. Palliative / GOC: Agree with [place] conversation with faimly   Subjective:    No change Palliative consulted  Objective:   BP (!) 113/50   Pulse 71   Temp 99.3 F (37.4 C) (Oral)   Resp 13   Wt 113 kg   SpO2 98%   BMI 39.02 kg/m   Physical Exam: Gen: Intubated   CVS: Pulse regular nl rate, S1 and S2 normal Resp: Anteriorly clear to auscultation, no distinct rales/rhonchi Abd: Soft, obese, nontender Ext: Trace left ankle edema, 1+ upper extremity edema.  LUA BCF +B/T  Labs: BMET Recent Labs  Lab 03/26/2019 2105 04/07/2019 2108 04/08/2019 2109 04/04/2019 2241 04/10/19 0525 04/10/19 1122 04/10/19 1517 04/11/19 0349 04/11/19 0353 04/11/19 1945 04/12/19 0413 04/12/19 2000 04/13/19 0331 04/14/19 0412  NA 141   < > 139   < > 142 139 142 138  --   --  138  --  135 137  K 6.6*   < > 6.2*   < > 5.5* 5.6* 6.1* 5.3*  --   --  5.1  --  4.8 4.9  CL 101  --  105  --  103  --  100  --   --   --  100  --  95* 95*  CO2 19*  --   --   --  22  --  22  --   --   --  25  --  27 26  GLUCOSE 186*  --  179*  --  106*  --  96  --   --   --  125*  --  116* 102*  BUN 81*  --  69*  --  86*  --  93*  --   --   --  83*  --  57* 81*  CREATININE 10.67*  --  11.50*  --  10.92*  --  11.73*  --   --   --  9.82*  --  6.70* 8.41*  CALCIUM 8.7*  --   --   --  9.0  --  9.1  --   --   --  8.4*  --  8.6* 9.4  PHOS  --   --   --   --  6.6*  --  6.8*  7.1*  --  7.6* 6.2* 7.3* 6.0* 7.0*  --    < > = values in this interval not displayed.    CBC Recent Labs  Lab 03/30/2019 2105 04/05/2019 2108 04/10/19 1517 04/10/19 1517 04/11/19 0349 04/12/19 0413 04/13/19 0331 04/14/19 0412  WBC 10.4   < > 14.2*  --   --  7.7 7.0 10.6*  NEUTROABS 8.0*  --   --   --   --   --   --   --   HGB 14.7   < > 11.9*   < > 10.5* 9.5* 9.6* 9.8*  HCT 46.5*   < > 37.2   < > 31.0* 29.5* 30.3* 31.2*  MCV  86.3   < > 85.9  --   --  86.5 86.1 86.0  PLT 222   < > 181  --   --  142* 154 190   < > = values in this interval not displayed.      Medications:    . chlorhexidine gluconate (MEDLINE KIT)  15 mL Mouth Rinse BID  . Chlorhexidine Gluconate Cloth  6 each Topical Q0600  . Chlorhexidine Gluconate Cloth  6 each Topical Q0600  . clonazePAM  1 mg Per Tube BID  . cloNIDine  0.2 mg Per Tube TID  . feeding supplement (PRO-STAT SUGAR FREE 64)  60 mL Per Tube BID  . heparin  5,000 Units Subcutaneous Q8H  . ipratropium-albuterol  3 mL Nebulization Q6H  . mouth rinse  15 mL Mouth Rinse 10 times per day  . pantoprazole sodium  40 mg Per Tube Daily  . sodium chloride flush  10-40 mL Intracatheter Q12H   Rexene Agent , MD 04/16/2019, 7:39 AM

## 2019-04-17 DIAGNOSIS — Z7189 Other specified counseling: Secondary | ICD-10-CM

## 2019-04-17 DIAGNOSIS — Z515 Encounter for palliative care: Secondary | ICD-10-CM

## 2019-04-17 LAB — TRIGLYCERIDES: Triglycerides: 133 mg/dL (ref <150)

## 2019-04-17 LAB — GLUCOSE, CAPILLARY
Glucose-Capillary: 144 mg/dL — ABNORMAL HIGH (ref 70–99)
Glucose-Capillary: 93 mg/dL (ref 70–99)
Glucose-Capillary: 109 mg/dL — ABNORMAL HIGH (ref 70–99)

## 2019-04-17 MED ORDER — MORPHINE BOLUS VIA INFUSION
5.0000 mg | INTRAVENOUS | Status: DC | PRN
  Filled 2019-04-17: qty 5

## 2019-04-17 MED ORDER — GLYCOPYRROLATE 0.2 MG/ML IJ SOLN: 0.2000 mg | INTRAMUSCULAR | Status: DC | PRN

## 2019-04-17 MED ORDER — BISACODYL 10 MG RE SUPP: 10.0000 mg | Freq: Every day | RECTAL | Status: DC | PRN

## 2019-04-17 MED ORDER — ACETAMINOPHEN 650 MG RE SUPP: 650.0000 mg | Freq: Four times a day (QID) | RECTAL | Status: DC | PRN

## 2019-04-17 MED ORDER — DIPHENHYDRAMINE HCL 50 MG/ML IJ SOLN: 25.0000 mg | INTRAMUSCULAR | Status: DC | PRN

## 2019-04-17 MED ORDER — FENTANYL BOLUS VIA INFUSION
25.0000 ug | INTRAVENOUS | Status: DC | PRN
  Filled 2019-04-17: qty 50

## 2019-04-17 MED ORDER — FENTANYL CITRATE (PF) 100 MCG/2ML IJ SOLN
50.0000 ug | INTRAMUSCULAR | Status: DC | PRN
  Filled 2019-04-17: qty 2

## 2019-04-17 MED ORDER — MIDAZOLAM HCL 2 MG/2ML IJ SOLN
2.0000 mg | INTRAMUSCULAR | Status: DC | PRN
  Administered 2019-04-17 (×2): 2 mg via INTRAVENOUS
  Administered 2019-04-18: 4 mg via INTRAVENOUS
  Administered 2019-04-18: 06:00:00 2 mg via INTRAVENOUS
  Filled 2019-04-17 (×2): qty 4
  Filled 2019-04-17 (×3): qty 2

## 2019-04-17 MED ORDER — MIDAZOLAM HCL 2 MG/2ML IJ SOLN: 1.0000 mg | INTRAMUSCULAR | Status: DC | PRN

## 2019-04-17 MED ORDER — MORPHINE 100MG IN NS 100ML (1MG/ML) PREMIX INFUSION: 0.0000 mg/h | INTRAVENOUS | Status: DC

## 2019-04-17 MED ORDER — ACETAMINOPHEN 325 MG PO TABS: 650.0000 mg | ORAL_TABLET | Freq: Four times a day (QID) | ORAL | Status: DC | PRN

## 2019-04-17 MED ORDER — FENTANYL 2500MCG IN NS 250ML (10MCG/ML) PREMIX INFUSION
25.0000 ug/h | INTRAVENOUS | Status: DC
  Administered 2019-04-17: 100 ug/h via INTRAVENOUS
  Administered 2019-04-18 (×2): 180 ug/h via INTRAVENOUS
  Filled 2019-04-17 (×3): qty 250

## 2019-04-17 MED ORDER — MIDAZOLAM BOLUS VIA INFUSION (WITHDRAWAL LIFE SUSTAINING TX)
2.0000 mg | INTRAVENOUS | Status: DC | PRN
  Filled 2019-04-17: qty 2

## 2019-04-17 MED ORDER — GLYCOPYRROLATE 0.2 MG/ML IJ SOLN
0.2000 mg | INTRAMUSCULAR | Status: DC | PRN
  Filled 2019-04-17: qty 1

## 2019-04-17 MED ORDER — MORPHINE SULFATE (PF) 2 MG/ML IV SOLN: 2.0000 mg | INTRAVENOUS | Status: DC | PRN

## 2019-04-17 MED ORDER — GLYCOPYRROLATE 0.2 MG/ML IJ SOLN
0.4000 mg | INTRAMUSCULAR | Status: DC
  Administered 2019-04-17 – 2019-04-19 (×9): 0.4 mg via INTRAVENOUS
  Filled 2019-04-17 (×8): qty 2

## 2019-04-17 MED ORDER — GLYCOPYRROLATE 1 MG PO TABS: 1.0000 mg | ORAL_TABLET | ORAL | Status: DC | PRN

## 2019-04-17 MED ORDER — BIOTENE DRY MOUTH MT LIQD: 15.0000 mL | OROMUCOSAL | Status: DC | PRN

## 2019-04-17 MED ORDER — POLYVINYL ALCOHOL 1.4 % OP SOLN: 1.0000 [drp] | Freq: Four times a day (QID) | OPHTHALMIC | Status: DC | PRN

## 2019-04-17 MED ORDER — POLYVINYL ALCOHOL 1.4 % OP SOLN
1.0000 [drp] | Freq: Four times a day (QID) | OPHTHALMIC | Status: DC | PRN
  Filled 2019-04-17: qty 15

## 2019-04-17 MED ORDER — MIDAZOLAM 50MG/50ML (1MG/ML) PREMIX INFUSION: 0.0000 mg/h | INTRAVENOUS | Status: DC

## 2019-04-17 NOTE — Progress Notes (Signed)
Patient ID: [name], female   DOB: 10/01/1945, 74 y.o.   MRN: [id number]  [facility] Progress Note   Assessment/ Plan:   1.  Acute encephalopathy: Likley anoxic.  Not improving.  Unfavorable neuro exam. Neuro following. On Keppra 2. ESRD: On THS schedule: 3.5h,  2K, AVF 400/800, no  heparin 3. Anemia: Hb stable 9s.  CTM 4. 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 Nutrition: tube feeds with protein supplementation. 6. Hypertension: Stable 7. Palliative / GOC: Agree with [place] conversation with faimly   Subjective:    No change HD yesterday 2.2L  UF Notes mention palliative extubation today  Objective:   BP (!) 167/64   Pulse 82   Temp 98.3 F (36.8 C) (Axillary)   Resp (!) 22   Wt 113.2 kg   SpO2 98%   BMI 39.09 kg/m   Physical Exam: Gen: Intubated   CVS: Pulse regular nl rate, S1 and S2 normal Resp: Anteriorly clear to auscultation, no distinct rales/rhonchi Abd: Soft, obese, nontender Ext: Trace left ankle edema, 1+ upper extremity edema.  LUA BCF +B/T  Labs: BMET Recent Labs  Lab 04/10/19 1122 04/10/19 1517 04/11/19 0349 04/11/19 0353 04/11/19 1945 04/12/19 0413 04/12/19 2000 04/13/19 0331 04/14/19 0412 04/16/19 1351  NA 139 142 138  --   --  138  --  135 137 137  K 5.6* 6.1* 5.3*  --   --  5.1  --  4.8 4.9 5.1  CL  --  100  --   --   --  100  --  95* 95* 96*  CO2  --  22  --   --   --  25  --  '27 26 25  ' GLUCOSE  --  96  --   --   --  125*  --  116* 102* 94  BUN  --  93*  --   --   --  83*  --  57* 81* 106*  CREATININE  --  11.73*  --   --   --  9.82*  --  6.70* 8.41* 8.47*  CALCIUM  --  9.1  --   --   --  8.4*  --  8.6* 9.4 9.5  PHOS  --  6.8*  7.1*  --  7.6* 6.2* 7.3* 6.0* 7.0*  --  8.7*   CBC Recent Labs  Lab 04/12/19 0413 04/13/19 0331 04/14/19 0412 04/16/19 1351  WBC 7.7 7.0 10.6* 7.4  HGB 9.5* 9.6* 9.8* 8.5*  HCT 29.5* 30.3* 31.2* 26.5*  MCV 86.5 86.1 86.0 86.3  PLT 142* 154 190 212      Medications:     . chlorhexidine gluconate (MEDLINE KIT)  15 mL Mouth Rinse BID  . Chlorhexidine Gluconate Cloth  6 each Topical Q0600  . Chlorhexidine Gluconate Cloth  6 each Topical Q0600  . clonazePAM  1 mg Per Tube BID  . cloNIDine  0.2 mg Per Tube TID  . feeding supplement (PRO-STAT SUGAR FREE 64)  60 mL Per Tube BID  . heparin  5,000 Units Subcutaneous Q8H  . mouth rinse  15 mL Mouth Rinse 10 times per day  . pantoprazole sodium  40 mg Per Tube Daily  . sodium chloride flush  10-40 mL Intracatheter Q12H   Rexene Agent , MD 04/17/2019, 10:21 AM

## 2019-04-17 NOTE — Progress Notes (Signed)
Assisted tele visit to patient with family member.  Abhishek Levesque Ann, RN  

## 2019-04-17 NOTE — Consult Note (Signed)
Consultation Note Date: 04/17/2019   Patient Name: [name]  DOB: 1945-05-23  MRN: [id number]  Age / Sex: 73 y.o., female  PCP: Tiffany Hatchet, MD Referring Physician: Candee Furbish, MD  Reason for Consultation: Establishing goals of care, Terminal Care and Withdrawal of life-sustaining treatment  HPI/Patient Profile:  Per H&P -->46 y/o morbidly obese female on Tuesday Thursday Saturday dialysis.  At baseline walks with a walker. Past medical history includes blindness in the right eye, chronic low back pain, coronary artery disease not otherwise specified, end-stage renal disease, OSA on CPAP, type 2 diabetes.  Multiple recent ER visits including March 02, 2019 for right knee pain, March 30, 2019 for dizziness with a negative MRI ("Old left occipital cortical and subcortical infarction. Chronic small-vessel ischemic changes throughout the brain elsewhere as outlined above.") and again on April 05, 2019 for hypoglycemia following dialysis.Found down pm 3/20, subsequent intubation on arrival. MRI brain negative for acute process on admit with presumed HIE though never arrested.   Palliative care was asked to get involved to assist in goals of care and terminal withdrawal management  Clinical Assessment and Goals of Care: I have reviewed medical records including EPIC notes, labs and imaging, received report from bedside RN, assessed the patient. Met with patient who is intubated and sedated at the present time.    I met with [name] (daughter) to further discuss diagnosis prognosis, GOC, EOL wishes, disposition and options.   I introduced Palliative Medicine as specialized medical care for people living with serious illness. It focuses on providing relief from the symptoms and stress of a serious illness. The goal is to improve quality of life for both the patient and the family.  A brief historical  review was completed. [name] was married though is a widow as her husband passed about fourteen years ago. She has four children though one was murdered. 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 She is a woman of faith.  [name] reviews that [name] health had declined dramatically as of very recently. She feels that her mothers ailments had been overall improving. She said that this is a very hard time for her in the setting of losing two family members recently. Prior to hospitalization [name] had been living independently.   [name] shares that the primary doctor had described the process of compassionate extubation well to her. She said that she knows her mother would never want to live like this. 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 The difference between an aggressive medical intervention path and a palliative comfort care path for this patient at this time was had.  We reviewed the medication that are used when patients transition to end of life inclusive of opioids, benzodiazepines, and anticholinergics.  [name] shares that [name] is a beloved member of their family. There are multiple family members who plan to visit with her prior to her being extubated. I informed [name] that it is our job to wait until she and her family  have had the time to say goodbye to their loved one.   Palliative care will remain involved to assist in supporting the family.  Decision Maker: [name] (Daughter) [phone]  SUMMARY OF RECOMMENDATIONS   DNAR  Initiated comfort face once family has had time with the patient  Chaplain consulted  Code Status/Advance Care Planning:  DNR  Recommendations for Symptom Management as below:  Dyspnea: Pain:  - Fentanyl gtt  - Fentanyl 25-43mg IVP PRN Q1H Fever:  - Tylenol as above Agitation: Anxiety:  - Initiate Midazolam gtt  - Give Midazolam 289mprior to extubation Nausea:  - Zofran 9m63mO/IV Q6H PRN  Secretions:  -  Glycopyrrolate 0.9mg49m Q4H ATC Dry Eyes:  - Artificial Tears PRN Xerostomia:  - Biotene 15ml97m  - BID oral care Urinary Retention:  - Maintain foley  Constipation:  - Bisacodyl 10mg 11mRN QDay Spiritual:  - Chaplain consult  Additional Recommendations (Limitations, Scope, Preferences):  Full Comfort Care will be initiated once family has visited with the patient  Psycho-social/Spiritual:   Desire for further Chaplaincy support: YES  Additional Recommendations: Caregiving  Support/Resources and Compassionate Wean Education  Prognosis:   Hours - Days  Discharge Planning: [facility] Dis[facility]   Primary Diagnoses: Present on Admission: . Acute respiratory failure with hypoxia (HCC)  [name] reviewed the medical record, interviewed the patient and family, and examined the patient. The following aspects are pertinent. Past Medical History:  Diagnosis Date  . Abdominal abscess 12-17-10   abdominal abscesses x2 ? one at this time  . Anemia   . Arthritis    "all over" (08/23/2012)  . Blind right eye    fell and crushed socket and [facility] fell out, [facility] ([facility]  [place]   . Coronary artery disease    normal coronaries by 08/25/12 cath  . Dyspnea   . Dysrhythmia    irregular, "skips beats"  . ESRD (end stage renal disease) on dialysis (HCC)  [place] (08/23/2012  . Exertional shortness of breath   . Gout   . H/O hiatal hernia   . Headache(784.0)    "weekly sometimes; since I fell and hit my head in 07/2011 08/23/2012)  . Heart murmur   . History of blood transfusion    "lots before starting dialysis" (08/23/2012)  . Hypertension    sees Dr. KimberWilley Bladehalation injury    "worked at [employer] inhale polyurethane or paint, etc" (08/23/2012)  . Myocardial infarction (HCC) [place]'s or 1980's  . Neuromuscular disorder (HCC)  Paradisiabetic neuropathy  . OSA on CPAP   .  Staph aureus infection November 2012   . Swelling of both ankles 12-17-10  . Type II diabetes mellitus (HCC)  [employer] [employer] now" (08/23/2012)   Social History   Socioeconomic History  . Marital status: Widowed    Spouse name: Not on file  . Number of children: Not on file  . Years of education: Not on file  . Highest education level: Not on file  Occupational History  . Not on file  Tobacco Use  . Smoking status: Former Smoker    Packs/day: 0.50    Years: 20.00    Pack years: 10.00    Types: Cigarettes    Quit date: 12/16/1980    Years since quitting: 38.3  . Smokeless tobacco: Never Used  Substance  and Sexual Activity  . Alcohol use: No  . Drug use: No  . Sexual activity: Not Currently  Other Topics Concern  . Not on file  Social History Narrative   ** Merged History Encounter **       Social Determinants of Health   Financial Resource Strain:   . Difficulty of Paying Living Expenses:   Food Insecurity:   . Worried About Charity fundraiser in the Last Year:   . Arboriculturist in the Last Year:   Transportation Needs:   . Film/video editor (Medical):   Marland Kitchen Lack of Transportation (Non-Medical):   Physical Activity:   . Days of Exercise per Week:   . Minutes of Exercise per Session:   Stress:   . Feeling of Stress :   Social Connections:   . Frequency of Communication with Friends and Family:   . Frequency of Social Gatherings with Friends and Family:   . Attends Religious Services:   . Active Member of Clubs or Organizations:   . Attends Archivist Meetings:   Marland Kitchen Marital Status:    Family History  Problem Relation Age of Onset  . Hypertension Mother   . Cancer Brother        spine  . Hypertension Father   . Cancer Sister        brain  . Hypertension Maternal Grandmother   . Anesthesia problems Neg Hx    Scheduled Meds: . chlorhexidine gluconate (MEDLINE KIT)  15 mL Mouth Rinse BID  . Chlorhexidine Gluconate Cloth  6 each Topical  Q0600  . Chlorhexidine Gluconate Cloth  6 each Topical Q0600  . clonazePAM  1 mg Per Tube BID  . cloNIDine  0.2 mg Per Tube TID  . feeding supplement (PRO-STAT SUGAR FREE 64)  60 mL Per Tube BID  . heparin  5,000 Units Subcutaneous Q8H  . mouth rinse  15 mL Mouth Rinse 10 times per day  . pantoprazole sodium  40 mg Per Tube Daily  . sodium chloride flush  10-40 mL Intracatheter Q12H   Continuous Infusions: . sodium chloride    . sodium chloride Stopped (04/17/19 1236)  . feeding supplement (VITAL HIGH PROTEIN) Stopped (04/17/19 1003)  . levETIRAcetam Stopped (04/17/19 0548)  . propofol (DIPRIVAN) infusion 15 mcg/kg/min (04/17/19 1300)   PRN Meds:.sodium chloride, sodium chloride, alteplase, fentaNYL (SUBLIMAZE) injection, heparin, hydrALAZINE, lidocaine (PF), lidocaine-prilocaine, midazolam, pentafluoroprop-tetrafluoroeth, sodium chloride flush Medications Prior to Admission:  Prior to Admission medications   Medication Sig Start Date End Date Taking? Authorizing Provider  acetaminophen (TYLENOL) 325 MG tablet Take 2 tablets (650 mg total) by mouth every 6 (six) hours as needed for mild pain (or Fever >/= 101). 10/12/18  Yes Arrien, Jimmy Picket, MD  albuterol (PROVENTIL) (2.5 MG/3ML) 0.083% nebulizer solution Take 3 mLs (2.5 mg total) by nebulization every 4 (four) hours as needed for shortness of breath. Patient taking differently: Take 2.5 mg by nebulization 3 (three) times daily.  06/22/18  Yes Dixie Dials, MD  allopurinol (ZYLOPRIM) 100 MG tablet Take 0.5 tablets (50 mg total) by mouth daily. Patient taking differently: Take 50 mg by mouth daily as needed (for gout flares).  06/23/18  Yes Dixie Dials, MD  aspirin EC 81 MG tablet Take 81 mg by mouth daily.     Yes [provider]  atorvastatin (LIPITOR) 40 MG tablet Take 40 mg by mouth daily.   Yes [provider]  budesonide-formoterol (SYMBICORT) 160-4.5 MCG/ACT inhaler Inhale  2 puffs into the lungs 2 (two) times  daily. 06/07/18  Yes Lauraine Rinne, NP  cloNIDine (CATAPRES) 0.1 MG tablet Take 0.1 mg by mouth in the morning, at noon, in the evening, and at bedtime.  03/26/19  Yes [provider]  meclizine (ANTIVERT) 50 MG tablet Take 1 tablet (50 mg total) by mouth 3 (three) times daily as needed. Patient taking differently: Take 50 mg by mouth 3 (three) times daily as needed for dizziness.  03/30/19  Yes Deno Etienne, DO  methocarbamol (ROBAXIN) 500 MG tablet Take 500 mg by mouth 3 (three) times daily as needed for muscle spasms. 03/21/19  Yes [provider]  Multiple Vitamins-Minerals (ONE-A-DAY WOMENS PO) Take 1 tablet by mouth daily with breakfast.   Yes [provider]  oxyCODONE-acetaminophen (PERCOCET/ROXICET) 5-325 MG tablet Take 1 tablet by mouth every 6 (six) hours as needed for moderate pain. 10/12/18  Yes Arrien, Jimmy Picket, MD  polyethylene glycol (MIRALAX / GLYCOLAX) 17 g packet Take 17 g by mouth daily. Patient taking differently: Take 17 g by mouth daily as needed for mild constipation.  10/13/18  Yes Arrien, Jimmy Picket, MD  sevelamer carbonate (RENVELA) 800 MG tablet Take 4 tablets (3,200 mg total) by mouth 3 (three) times daily with meals. 06/22/18  Yes Dixie Dials, MD  Tiotropium Bromide Monohydrate (SPIRIVA RESPIMAT) 2.5 MCG/ACT AERS Inhale 2 puffs into the lungs daily. Patient taking differently: Inhale 1 puff into the lungs daily.  06/07/18  Yes Lauraine Rinne, NP  doxercalciferol (HECTOROL) 4 MCG/2ML injection Inject 1.5 mLs (3 mcg total) into the vein Every Tuesday,Thursday,and Saturday with dialysis. Patient not taking: Reported on 04/10/2019 06/22/18   Dixie Dials, MD  HYDROcodone-acetaminophen (NORCO/VICODIN) 5-325 MG tablet Take 1 tablet by mouth every 8 (eight) hours as needed. Patient not taking: Reported on 03/30/2019 03/02/19   Couture, Cortni S, PA-C   Allergies  Allergen Reactions  . Adhesive [Tape] Rash  . Lyrica [Pregabalin] Other (See Comments)     Hallucinations   Review of Systems  Unable to perform ROS: Intubated   Physical Exam Vitals and nursing note reviewed.  Constitutional:      Appearance: She is ill-appearing.     Comments: Sedated  HENT:     Head: Normocephalic.     Nose: Nose normal.     Mouth/Throat:     Mouth: Mucous membranes are dry.  Cardiovascular:     Rate and Rhythm: Normal rate and regular rhythm.  Pulmonary:     Comments: Ventilator Abdominal:     General: There is distension.     Palpations: Abdomen is soft.  Skin:    General: Skin is dry.     Capillary Refill: Capillary refill takes less than 2 seconds.  Neurological:     Comments: Somnolent   Vital Signs: BP (!) 100/50   Pulse 68   Temp 98.4 F (36.9 C) (Axillary)   Resp 13   Wt 113.2 kg   SpO2 98%   BMI 39.09 kg/m  Pain Scale: CPOT     SpO2: SpO2: 98 % O2 Device:SpO2: 98 % O2 Flow Rate: .O2 Flow Rate (L/min): 20 L/min  IO: Intake/output summary:   Intake/Output Summary (Last 24 hours) at 04/17/2019 1502 Last data filed at 04/17/2019 1300 Gross per 24 hour  Intake 1266.18 ml  Output 2225 ml  Net -958.82 ml   LBM: Last BM Date: 04/16/19 Baseline Weight: Weight: 120.1 kg Most recent weight: Weight: 113.2 kg  Palliative Assessment/Data: 10% Time In: 230 Time Out: 340 Time Total: 70 Greater than 50%  of this time was spent counseling and coordinating care related to the above assessment and plan.  Signed by: Rosezella Rumpf, NP   Please contact Palliative Medicine Team phone at [phone] for questions and concerns.  For individual provider: See Shea Evans

## 2019-04-17 NOTE — Progress Notes (Signed)
Family ready for compassionate extubation of loved one, Daughter [name] and other family members at bedside. Comfort medications administered and patient extubated with RT at bedside at 1812. Attending physician aware.  Candy Sledge, RN

## 2019-04-17 NOTE — Progress Notes (Signed)
Neurology progress note  S: Patient seen and examined No acute changes reported. Patient did not provide any history  O: Not on any sedation Intubated Opens eyes to loud voice and noxious stimulation. Does not follow commands Pinpoint pupils sluggishly reactive. Very weak corneals bilaterally Breathing over the ventilator No spontaneous movements No withdrawal to noxious stimulation  Labs CBC    Component Value Date/Time   WBC 7.4 04/16/2019 1351   RBC 3.07 (L) 04/16/2019 1351   HGB 8.5 (L) 04/16/2019 1351   HCT 26.5 (L) 04/16/2019 1351   PLT 212 04/16/2019 1351   MCV 86.3 04/16/2019 1351   MCH 27.7 04/16/2019 1351   MCHC 32.1 04/16/2019 1351   RDW 19.2 (H) 04/16/2019 1351   LYMPHSABS 1.4 04/18/2019 2105   MONOABS 0.9 04/18/2019 2105   EOSABS 0.0 04/08/2019 2105   BASOSABS 0.0 04/03/2019 2105   CMP Latest Ref Rng & Units 04/16/2019 04/14/2019 04/13/2019  Glucose 70 - 99 mg/dL 94 102(H) 116(H)  BUN 8 - 23 mg/dL 106(H) 81(H) 57(H)  Creatinine 0.44 - 1.00 mg/dL 8.47(H) 8.41(H) 6.70(H)  Sodium 135 - 145 mmol/L 137 137 135  Potassium 3.5 - 5.1 mmol/L 5.1 4.9 4.8  Chloride 98 - 111 mmol/L 96(L) 95(L) 95(L)  CO2 22 - 32 mmol/L 25 26 27   Calcium 8.9 - 10.3 mg/dL 9.5 9.4 8.6(L)  Total Protein 6.5 - 8.1 g/dL - - 6.1(L)  Total Bilirubin 0.3 - 1.2 mg/dL - - 0.6  Alkaline Phos 38 - 126 U/L - - 43  AST 15 - 41 U/L - - 14(L)  ALT 0 - 44 U/L - - 13   Imaging personally reviewed: MRI brain with no acute changes.  Assessment and recommendations: 74 year old woman brought in after being found down, with concern for seizures-no witnessed seizures though. Given all cortical infarcts, seizures were in the realm of possibility and she started on antiepileptics Prolonged EEG did not show any seizures but showed likely myoclonic activity. Likely secondary to hypoxia metabolic derangements including ESRD related derangements. MRI not very revealing of an acute process but examination is  consistent with hypoxic ischemic encephalopathy and possibly a component of anoxic brain injury. Exam has been poor over the last 5 to 6 days with the best exam with a questionable withdrawal in the lower extremity, which she has not exhibited over the last few days. Has brainstem reflexes intact but unable to elicit any higher cortical function evidence. Likely has multifactorial toxic metabolic encephalopathy with a component of hypoxic ischemic encephalopathy and anoxic brain injury Primary team is in talks with the family for one-way extubation, which I think is a reasonable choice. I would defer the rest of the care to the primary team. Neurology will be available as needed.  Please call with questions.  -- Amie Portland, MD [facility] Pager: [phone] If 7pm to 7am, please call on call as listed on AMION.

## 2019-04-17 NOTE — Progress Notes (Signed)
NAME:  [name], MRN:  [id number], DOB:  1945/09/26, LOS: 7 ADMISSION DATE:  04/02/2019, CONSULTATION DATE:  03/23/2019 REFERRING MD: Dr. Regenia Skeeter, CHIEF COMPLAINT: Acute encephalopathy  Brief History   74 y/o F, remote smoker, found down pm 3/20, subsequent intubation on arrival. MRI brain negative for acute process on admit with presumed HIE though never arrested.   Past Medical History  ESRD - on HD T,Th,S OSA - on CPAP  CHF  DM    Significant Hospital Events   03/02/2019 ER visit for arthritic type knee pain and history of fall with generalized weakness.  Right knee was swollen and warm.  Treated with as needed hydrocodone 03/30/2019 ER visit for generalized weakness, dizziness and vertigo following dialysis.Marland Kitchen  MRI negative. 04/05/2019 Hypoglycemia after dialysis.  Brought in confused and disoriented to the ER 3/20 Admit, after being found unresponsive, intubated in ER 3/22 Ongoing tremors with stimulation, unresponsive, not following commands 3/23 Failed PSV   Consults:  Neuro  Nephrology    Procedures:  ETT 3/20 >>  L IJ (ER) 3/20 >>  R Fem Aline 3/21 >>  3/22   Significant Diagnostic Tests:   MR Brain w/o contrast 3/20 >> No acute intracranial abnormality. Diffuse soft tissue swelling throughout the right frontoparietal and temporal scalp. Query recent trauma. 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 Underlying age-related cerebral atrophy with moderate chronic microvascular ischemic disease, with multiple additional remote lacunar infarcts as above. Diffuse mucosal thickening with scattered air-fluid levels seen throughout the paranasal sinuses.  EEG 3/21 >> gen slowing but no sz   ECHO/TTE 3/23 >>  Severe LAE, mild LVH/ Moderate AS with mean gradient now 21  Micro/ID  Data:  COVID 3/20 >> negative  Influenza A/B 3/20 >> negative MRSA PCR 3/21 >> negative  Tracheal aspirate 3/21 >> MSSA  BC x 2  3/21  >> neg   Antimicrobials:  Vanco 3/20 >>  3/21  Cefepime 3/20 >> 3/23 Cefazolin 3/23 >>  3/26  (completed 7 days effective rx for MSSA)   Interim history/subjective:   No events, remains unresponsive on vent.  Scheduled Meds: . chlorhexidine gluconate (MEDLINE KIT)  15 mL Mouth Rinse BID  . Chlorhexidine Gluconate Cloth  6 each Topical Q0600  . Chlorhexidine Gluconate Cloth  6 each Topical Q0600  . clonazePAM  1 mg Per Tube BID  . cloNIDine  0.2 mg Per Tube TID  . feeding supplement (PRO-STAT SUGAR FREE 64)  60 mL Per Tube BID  . heparin  5,000 Units Subcutaneous Q8H  . mouth rinse  15 mL Mouth Rinse 10 times per day  . pantoprazole sodium  40 mg Per Tube Daily  . sodium chloride flush  10-40 mL Intracatheter Q12H   Continuous Infusions: . sodium chloride    . sodium chloride 10 mL/hr at 04/17/19 0800  . feeding supplement (VITAL HIGH PROTEIN) 35 mL/hr at 04/16/19 1900  . levETIRAcetam Stopped (04/17/19 0548)  . propofol (DIPRIVAN) infusion 15 mcg/kg/min (04/17/19 0800)   PRN Meds:.sodium chloride, sodium chloride, alteplase, fentaNYL (SUBLIMAZE) injection, heparin, hydrALAZINE, lidocaine (PF), lidocaine-prilocaine, midazolam, pentafluoroprop-tetrafluoroeth, sodium chloride flush   Objective   Blood pressure (!) 124/54, pulse 69, temperature 98.9 F (37.2 C), temperature source Oral, resp. rate 13, weight 113.2 kg, SpO2 97 %.    Vent Mode: PRVC FiO2 (%):  [30 %] 30 % Set Rate:  [12 bmp] 12 bmp Vt Set:  [500 mL] 500 mL PEEP:  [5 cmH20] 5 cmH20 Plateau Pressure:  [  16 cmH20-20 cmH20] 19 cmH20   Intake/Output Summary (Last 24 hours) at 04/17/2019 0858 Last data filed at 04/17/2019 0800 Gross per 24 hour  Intake 1448.86 ml  Output 2225 ml  Net -776.14 ml   Filed Weights   04/14/19 1655 04/15/19 0500 04/16/19 1345  Weight: 118 kg 113 kg 113.2 kg      GEN: unresponsive woman on vent HEENT: ETT in place, has tongue swelling CV: RRR, ext warm, +SEM PULM: Scattered rhonci, triggering vent, weak GI: Soft,  +BS EXT: Trace anasarca NEURO: got her to cough to noxious stimuli but that's it, no withdraw PSYCH: cannot assess SKIN: No rashes   Resolved Hospital Problem list    Acute Metabolic Encephalopathy - multifactorial but there is a strong suspicion for irreversible anoxic injury. - Plan for palliative extubation later today, please call when they are here so I can tell them what to expect  Continue supportive care in interim, see orders.  Erskine Emery MD PCCM

## 2019-04-17 NOTE — Progress Notes (Signed)
Responded to Spiritual Consult for support of family at end of life for Mrs. Biever.  [name], her daughter, her granddaughter and other family members including their pastor were present.  Prayed with the family members gathered and departed.  De [place]rs Chaplain Resident

## 2019-04-17 NOTE — Progress Notes (Signed)
Notified of family readiness to proceed with compassionate extubation and comfort care. Orders placed.   Eliseo Gum MSN, AGACNP-BC [place] [phone] If no answer, [phone] 04/17/2019, 5:43 PM

## 2019-04-17 NOTE — ACP (Advance Care Planning) (Signed)
  Palliative Medicine Inpatient Follow Up Note   ADVANCED CARE NOTE  Family meeting took place with patients daughter, Phil Dopp.   [name] shares that the primary doctor had described the process of compassionate extubation well to her. She said that she knows her mother would never want to live like this. 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 The difference between an aggressive medical intervention path and a palliative comfort care path for this patient at this time was had.  We reviewed the medication that are used when patients transition to end of life inclusive of opioids, benzodiazepines, and anticholinergics.  [name] shares that Cinnamon is a beloved member of their family. There are multiple family members who plan to visit with her prior to her being extubated. I informed [name] that it is our job to wait until she and her family have had the time to say goodbye to their loved one.   Palliative care will remain involved to assist in supporting the family.  Discussed with patient the importance of continued conversation with family and their medical providers regarding overall plan of care and treatment options, ensuring decisions are within the context of the patients values and GOCs.  CODE STATUS: DNAR  Family is deciding on when to pursue compassionate extubation.   Time Spent:30 ______________________________________________________________________________________ [place] Palliative Medicine Team Team Cell Phone: [phone] Pleaseutilize secure chat with additional questions, if there is no response within 30 minutes please call the above phone number  Palliative Medicine Team providers are available by phone from 7am to 7pm daily and can be reached through the team cell phone.  Should this patient require assistance outside of these hours, please call the patient's attending physician.

## 2019-04-17 NOTE — Procedures (Signed)
Extubation Procedure Note  Patient Details:   Name: AMANDO ISHIKAWA DOB: 03/14/1945 MRN: [id number]   Airway Documentation:    Vent end date: 04/17/19 Vent end time: 1806   Evaluation  O2 sats: currently acceptable Complications: No apparent complications Patient did tolerate procedure well. Bilateral Breath Sounds: Clear, Diminished   No   Pt terminally extubated to room air.  RN and family at bedside.  Pt is a DNR.  RT will continue to monitor.  Pierre Bali 04/17/2019, 6:22 PM

## 2019-04-17 NOTE — Progress Notes (Signed)
CDS referral [phone]

## 2019-04-18 DIAGNOSIS — Z515 Encounter for palliative care: Secondary | ICD-10-CM

## 2019-04-18 MED ORDER — WHITE PETROLATUM EX OINT
TOPICAL_OINTMENT | CUTANEOUS | Status: AC
  Filled 2019-04-18: qty 28.35

## 2019-04-18 NOTE — Progress Notes (Signed)
Patient is on comfort care transferred from 4N unresponsive. Family is at bedside. Will continue to monitor patient.

## 2019-04-18 NOTE — Plan of Care (Signed)
See that patient terminally extubated.  Spoke with RN.  Family has gotten to see her and her daughter has been at bedside.  Renal will sign off.  Please do not hesitate to contact us if we can be of assistance.  Appreciate staff support of patient and her family  Claudia Desanctis 04/18/2019 6:58 AM

## 2019-04-18 NOTE — Progress Notes (Addendum)
NAME:  [name], MRN:  [id number], DOB:  [birth year], LOS: 8 ADMISSION DATE:  04/16/2019, CONSULTATION DATE:  03/24/2019 REFERRING MD: Dr. Regenia Skeeter, CHIEF COMPLAINT: Acute encephalopathy  Brief History   74 y/o F, remote smoker, found down pm 3/20, subsequent intubation on arrival. MRI brain negative for acute process on admit with presumed HIE though never arrested.   Past Medical History  ESRD - on HD T,Th,S OSA - on CPAP  CHF  DM    Significant Hospital Events   03/02/2019 ER visit for arthritic type knee pain and history of fall with generalized weakness.  Right knee was swollen and warm.  Treated with as needed hydrocodone 03/30/2019 ER visit for generalized weakness, dizziness and vertigo following dialysis.Marland Kitchen  MRI negative. 04/05/2019 Hypoglycemia after dialysis.  Brought in confused and disoriented to the ER 3/20 Admit, after being found unresponsive, intubated in ER 3/22 Ongoing tremors with stimulation, unresponsive, not following commands 3/23 Failed PSV  3/28 terminal extubation and comfort care  Consults:  Neuro  Nephrology    Procedures:  ETT 3/20 >>  L IJ (ER) 3/20 >>  R Fem Aline 3/21 >>  3/22   Significant Diagnostic Tests:   MR Brain w/o contrast 3/20 >> No acute intracranial abnormality. Diffuse soft tissue swelling throughout the right frontoparietal and temporal scalp. Query recent trauma. 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 Underlying age-related cerebral atrophy with moderate chronic microvascular ischemic disease, with multiple additional remote lacunar infarcts as above. Diffuse mucosal thickening with scattered air-fluid levels seen throughout the paranasal sinuses.  EEG 3/21 >> gen slowing but no sz   ECHO/TTE 3/23 >>  Severe LAE, mild LVH/ Moderate AS with mean gradient now 21  Micro/ID  Data:  COVID 3/20 >> negative  Influenza A/B 3/20 >> negative MRSA PCR 3/21 >> negative  Tracheal aspirate 3/21 >> MSSA  BC x 2  3/21   >> neg   Antimicrobials:  Vanco 3/20 >> 3/21  Cefepime 3/20 >> 3/23 Cefazolin 3/23 >>  3/26  (completed 7 days effective rx for MSSA)   Interim history/subjective:   Extubated, appears comfortable on fentanyl drip.   Objective   Blood pressure (!) 100/50, pulse 85, temperature 98.4 F (36.9 C), temperature source Axillary, resp. rate 13, height 5\' 7"  (1.702 m), weight 113.2 kg, SpO2 (!) 79 %.    Vent Mode: CPAP;PSV FiO2 (%):  [30 %] 30 % PEEP:  [5 cmH20] 5 cmH20 Pressure Support:  [10 cmH20] 10 cmH20 Plateau Pressure:  [15 cmH20] 15 cmH20   Intake/Output Summary (Last 24 hours) at 04/18/2019 0847 Last data filed at 04/18/2019 0300 Gross per 24 hour  Intake 420.57 ml  Output --  Net 420.57 ml   Filed Weights   04/15/19 0500 04/16/19 1345 04/17/19 1700  Weight: 113 kg 113.2 kg 113.2 kg      GEN: unresponsive woman lying in bed HEENT: tongue swelling CV: RRR, ext warm, +SEM PULM: transmitted upper airway sounds, shallow inspiratory effort GI: Soft, +BS EXT: Trace anasarca NEURO: unresponsive PSYCH: cannot assess SKIN: No rashes   Resolved Hospital Problem list    Acute Metabolic Encephalopathy -  strong suspicion for irreversible anoxic injury.  After long discussions with family decision made to allow Ms. Modi to pass in peace without further aggressive life support. - Fentanyl titrated to comfort - Appreciate neuro and palliative care input - Transfer to palliative bed, will ask TR[facility] to take over starting tomorrow if she is still alive -  Plan to talk to daughter when she wakes up, she is at bedside asleep  Erskine Emery MD PCCM

## 2019-04-18 NOTE — Progress Notes (Signed)
Palliative Medicine-   Visit for symptom check in actively dying patient who has been transitioned to comfort measures only.  She is unresponsive and appears comfortable.  Support provided to her daughter who was at bedside.   Plan-  Continue current comfort measures as ordered- no need for adjustments  Mariana Kaufman, AGNP-C Palliative Medicine  Please call Palliative Medicine team phone with any questions [phone]. For individual providers please see AMION.  Total time: 15 minutes

## 2019-04-19 DIAGNOSIS — Z7189 Other specified counseling: Secondary | ICD-10-CM

## 2019-04-19 DIAGNOSIS — G931 Anoxic brain damage, not elsewhere classified: Secondary | ICD-10-CM

## 2019-04-19 MED ORDER — LORAZEPAM 2 MG/ML IJ SOLN
2.0000 mg | INTRAMUSCULAR | Status: DC
  Administered 2019-04-19: 2 mg via INTRAVENOUS
  Filled 2019-04-19: qty 1

## 2019-04-19 MED ORDER — GLYCOPYRROLATE 0.2 MG/ML IJ SOLN
0.4000 mg | INTRAMUSCULAR | Status: DC
  Administered 2019-04-19: 0.4 mg via INTRAVENOUS
  Filled 2019-04-19 (×2): qty 2

## 2019-04-21 NOTE — Progress Notes (Signed)
PROGRESS NOTE    [name]  ZJQ:[id number] DOB: 1945/04/08 DOA: 03/22/2019 PCP: [name], MD   Brief Narrative:  66 old female with complex medical history history including ESRD on dialysis T TTS, OSA on CPAP, CHF, diabetes mellitus, remote smoker admitted on 320 after being found unresponsive.  Patient was intubated in the ER and was admitted to ICU, work-up negative with MRI brain for acute process, presumed hypoxic ischemic encephalopathy.  Patient is having ongoing tremors with stimulation but unresponsive not following commands, on 3/28 terminally extubated for comfort.  Significant events: 03/02/2019 ER visit for arthritic type knee pain and history of fall with generalized weakness.  Right knee was swollen and warm.  Treated with as needed hydrocodone 03/30/2019 ER visit for generalized weakness, dizziness and vertigo following dialysis.Marland Kitchen  MRI negative. 04/05/2019 Hypoglycemia after dialysis.  Brought in confused and disoriented to the ER 3/20 Admit, after being found unresponsive, intubated in ER 3/22 Ongoing tremors with stimulation, unresponsive, not following commands 3/23 Failed PSV  3/28 terminal extubation and comfort care 3/30: Transferred to [facility] service for ongoing comfort measures  Subjective: Daughter at the bedside.  Daughter concerned about intermittent possible convulsions. Patient is unresponsive.  Assessment & Plan:  Acute encephalopathy/anoxic brain injury-hypoxic metabolic derangements.  No seizures on EEG but myoclonus. Seen by neurology, had intact brainstem reflexes but unable to elicit higher cortical function-suspected multifactorial toxic metabolic encephalopathy with component of hypoxic ischemic encephalopathy and anoxic brain injury.  ESRD Acute respiratory failure with hypoxia  Aspiration pneumonia  Hypoxic brain injury  Advanced care planning/counseling discussion Morbid obesity with BMI 39 Type 2 diabetes mellitus/diabetic  neuropathy CAD Hypertension Prosthetic right eye Severe back pain due to L4/L5 stenosis Anemia Arthritis Total Knee arthroplasty left  Plan: Appreciate palliative care input, continue on current comfort measures on actively dying patient with fentanyl drip, Ativan and Robinul injection. Daughter at the bedside updated. Anticipating hospital death hours 2 days  DVT prophylaxis:NONE Code Status:DNR  Consultants:see note  Procedures: ETT 3/20 >>  L IJ (ER) 3/20 >>  R Fem Aline 3/21 >>  3/22   MR Brain w/o contrast 3/20 >> No acute intracranial abnormality. Diffuse soft tissue swelling throughout the right frontoparietal and temporal scalp. Query recent trauma. 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 Underlying age-related cerebral atrophy with moderate chronic microvascular ischemic disease, with multiple additional remote lacunar infarcts as above. Diffuse mucosal thickening with scattered air-fluid levels seen throughout the paranasal sinuses.  EEG 3/21 >> gen slowing but no sz   ECHO/TTE 3/23 >>  Severe LAE, mild LVH/ Moderate AS with mean gradient now 21  Microbiology:see note COVID 3/20 >> negative  Influenza A/B 3/20 >> negative MRSA PCR 3/21 >> negative  Tracheal aspirate 3/21 >> MSSA  BC x 2  3/21  >> neg   Medications: Scheduled Meds: . glycopyrrolate  0.4 mg Intravenous Q2H  . LORazepam  2 mg Intravenous Q4H   Continuous Infusions: . fentaNYL infusion INTRAVENOUS 180 mcg/hr (04/18/19 2326)    Antimicrobials: Anti-infectives (From admission, onward)   Start     Dose/Rate Route Frequency Ordered Stop   04/12/19 1800  ceFEPIme (MAXIPIME) 2 g in sodium chloride 0.9 % 100 mL IVPB  Status:  Discontinued     2 g 200 mL/hr over 30 Minutes Intravenous Every T-Th-Sa (1800) 04/10/19 0038 04/10/19 1518   04/12/19 1800  ceFAZolin (ANCEF) IVPB 1 g/50 mL premix     1 g 100 mL/hr over 30 Minutes Intravenous Every  24 hours 04/12/19 0940 04/15/19 1859    04/12/19 1200  vancomycin (VANCOCIN) IVPB 1000 mg/200 mL premix  Status:  Discontinued     1,000 mg 200 mL/hr over 60 Minutes Intravenous Every T-Th-Sa (Hemodialysis) 04/10/19 0038 04/10/19 0829   04/11/19 1800  ceFEPIme (MAXIPIME) 1 g in sodium chloride 0.9 % 100 mL IVPB  Status:  Discontinued     1 g 200 mL/hr over 30 Minutes Intravenous Every 24 hours 04/10/19 1518 04/12/19 0940   04/10/19 1515  ceFEPIme (MAXIPIME) 2 g in sodium chloride 0.9 % 100 mL IVPB     2 g 200 mL/hr over 30 Minutes Intravenous  Once 04/10/19 1510 04/10/19 1610   04/10/19 0030  vancomycin (VANCOREADY) IVPB 2000 mg/400 mL     2,000 mg 200 mL/hr over 120 Minutes Intravenous  Once 04/10/19 0016 04/10/19 0113   03/23/2019 2215  vancomycin (VANCOCIN) IVPB 1000 mg/200 mL premix  Status:  Discontinued     1,000 mg 200 mL/hr over 60 Minutes Intravenous  Once 04/13/2019 2203 04/10/19 0016   03/29/2019 2215  ceFEPIme (MAXIPIME) 1 g in sodium chloride 0.9 % 100 mL IVPB  Status:  Discontinued     1 g 200 mL/hr over 30 Minutes Intravenous  Once 04/20/2019 2203 04/10/19 0017       Objective: Vitals: Today's Vitals   04/18/19 1500 04/18/19 1600 04/18/19 1659 May 09, 2019 0504  BP:   (!) 115/39 (!) 105/32  Pulse: 80 81 82 87  Resp: (!) 22 19 14 18   Temp:   98.5 F (36.9 C) 99.3 F (37.4 C)  TempSrc:   Oral Axillary  SpO2: (!) 85% (!) 84% 90% (!) 79%  Weight:      Height:        Intake/Output Summary (Last 24 hours) at 05/09/19 1313 Last data filed at 09-May-2019 9562 Gross per 24 hour  Intake 0 ml  Output 0 ml  Net 0 ml   Filed Weights   04/15/19 0500 04/16/19 1345 04/17/19 1700  Weight: 113 kg 113.2 kg 113.2 kg   Weight change:    Intake/Output from previous day: No intake/output data recorded. Intake/Output this shift: No intake/output data recorded.  Examination:  General exam: Unresponsive,  HEENT: eyes closed Respiratory system: breathing erratically Leg cold Further exam deferred for  comfort.  Data Reviewed: I have personally reviewed following labs and imaging studies CBC: Recent Labs  Lab 04/13/19 0331 04/14/19 0412 04/16/19 1351  WBC 7.0 10.6* 7.4  HGB 9.6* 9.8* 8.5*  HCT 30.3* 31.2* 26.5*  MCV 86.1 86.0 86.3  PLT 154 190 130   Basic Metabolic Panel: Recent Labs  Lab 04/12/19 2000 04/13/19 0331 04/14/19 0412 04/16/19 1351  NA  --  135 137 137  K  --  4.8 4.9 5.1  CL  --  95* 95* 96*  CO2  --  27 26 25   GLUCOSE  --  116* 102* 94  BUN  --  57* 81* 106*  CREATININE  --  6.70* 8.41* 8.47*  CALCIUM  --  8.6* 9.4 9.5  MG 2.0 2.2  --   --   PHOS 6.0* 7.0*  --  8.7*   GFR: Estimated Creatinine Clearance: 7.4 mL/min (A) (by C-G formula based on SCr of 8.47 mg/dL (H)). Liver Function Tests: Recent Labs  Lab 04/13/19 0331 04/16/19 1351  AST 14*  --   ALT 13  --   ALKPHOS 43  --   BILITOT 0.6  --   PROT 6.1*  --  ALBUMIN 2.2* 2.2*   No results for input(s): LIPASE, AMYLASE in the last 168 hours. No results for input(s): AMMONIA in the last 168 hours. Coagulation Profile: No results for input(s): INR, PROTIME in the last 168 hours. Cardiac Enzymes: Recent Labs  Lab 04/13/19 0331  CKTOTAL 73  CKMB 1.9   BNP (last 3 results) No results for input(s): PROBNP in the last 8760 hours. HbA1C: No results for input(s): HGBA1C in the last 72 hours. CBG: Recent Labs  Lab 04/16/19 1935 04/16/19 2327 04/17/19 0312 04/17/19 0754 04/17/19 1208  GLUCAP 88 109* 144* 93 109*   Lipid Profile: Recent Labs    04/17/19 0536  TRIG 133   Thyroid Function Tests: No results for input(s): TSH, T4TOTAL, FREET4, T3FREE, THYROIDAB in the last 72 hours. Anemia Panel: No results for input(s): VITAMINB12, FOLATE, FERRITIN, TIBC, IRON, RETICCTPCT in the last 72 hours. Sepsis Labs: Recent Labs  Lab 04/13/19 0000  LATICACIDVEN 1.0    Recent Results (from the past 240 hour(s))  Respiratory Panel by RT PCR (Flu A&B, Covid) - Nasopharyngeal Swab      Status: None   Collection Time: 03/29/2019  9:28 PM   Specimen: Nasopharyngeal Swab  Result Value Ref Range Status   SARS Coronavirus 2 by RT PCR NEGATIVE NEGATIVE Final    Comment: (NOTE) SARS-CoV-2 target nucleic acids are NOT DETECTED. The SARS-CoV-2 RNA is generally detectable in upper respiratoy specimens during the acute phase of infection. The lowest concentration of SARS-CoV-2 viral copies this assay can detect is 131 copies/mL. A negative result does not preclude SARS-Cov-2 infection and should not be used as the sole basis for treatment or other patient management decisions. A negative result may occur with  improper specimen collection/handling, submission of specimen other than nasopharyngeal swab, presence of viral mutation(s) within the areas targeted by this assay, and inadequate number of viral copies (<131 copies/mL). A negative result must be combined with clinical observations, patient history, and epidemiological information. The expected result is Negative. Fact Sheet for Patients:  PinkCheek.be Fact Sheet for Healthcare Providers:  GravelBags.it This test is not yet ap proved or cleared by the [place] FDA and  has been authorized for detection and/or diagnosis of SARS-CoV-2 by FDA under an Emergency Use Authorization (EUA). This EUA will remain  in effect (meaning this test can be used) for the duration of the COVID-19 declaration under Section 564(b)(1) of the Act, 21 U.S.C. section 360bbb-3(b)(1), unless the authorization is terminated or revoked sooner.    Influenza A by PCR NEGATIVE NEGATIVE Final   Influenza B by PCR NEGATIVE NEGATIVE Final    Comment: (NOTE) The Xpert Xpress SARS-CoV-2/FLU/RSV assay is intended as an aid in  the diagnosis of influenza from Nasopharyngeal swab specimens and  should not be used as a sole basis for treatment. Nasal washings and  aspirates are unacceptable for  Xpert Xpress SARS-CoV-2/FLU/RSV  testing. Fact Sheet for Patients: PinkCheek.be Fact Sheet for Healthcare Providers: GravelBags.it This test is not yet approved or cleared by the [place] FDA and  has been authorized for detection and/or diagnosis of SARS-CoV-2 by  FDA under an Emergency Use Authorization (EUA). This EUA will remain  in effect (meaning this test can be used) for the duration of the  Covid-19 declaration under Section 564(b)(1) of the Act, 21  U.S.C. section 360bbb-3(b)(1), unless the authorization is  terminated or revoked. Performed at [facility], [place] [address] [place], [place] [address]   MRSA PCR Screening  Status: None   Collection Time: 04/10/19  2:03 AM   Specimen: Nasal Mucosa; Nasopharyngeal  Result Value Ref Range Status   MRSA by PCR NEGATIVE NEGATIVE Final    Comment:        The GeneXpert MRSA Assay (FDA approved for NASAL specimens only), is one component of a comprehensive MRSA colonization surveillance program. It is not intended to diagnose MRSA infection nor to guide or monitor treatment for MRSA infections. Performed at [facility], [place] [address], [place], [place] [address]   Culture, blood (routine x 2)     Status: None   Collection Time: 04/10/19  5:25 AM   Specimen: BLOOD  Result Value Ref Range Status   Specimen Description BLOOD LEFT ANTECUBITAL  Final   Special Requests   Final    BOTTLES DRAWN AEROBIC ONLY Blood Culture results may not be optimal due to an inadequate volume of blood received in culture bottles   Culture   Final    NO GROWTH 5 DAYS Performed at [facility], [place] [address], [place], [place] [address]    Report Status 04/15/2019 FINAL  Final  Culture, blood (routine x 2)     Status: None   Collection Time: 04/10/19  5:38 AM   Specimen: BLOOD LEFT ARM  Result Value Ref Range Status   Specimen Description BLOOD LEFT ARM  Final    Special Requests   Final    BOTTLES DRAWN AEROBIC ONLY Blood Culture results may not be optimal due to an inadequate volume of blood received in culture bottles   Culture   Final    NO GROWTH 5 DAYS Performed at [facility], [place] [address], [place], [place] [address]    Report Status 04/15/2019 FINAL  Final  Culture, respiratory (tracheal aspirate)     Status: None   Collection Time: 04/10/19 10:21 AM   Specimen: Tracheal Aspirate; Respiratory  Result Value Ref Range Status   Specimen Description TRACHEAL ASPIRATE  Final   Special Requests NONE  Final   Gram Stain   Final    FEW WBC PRESENT,BOTH PMN AND MONONUCLEAR MODERATE GRAM POSITIVE COCCI IN PAIRS IN CLUSTERS Performed at [facility], [address], [place],  [address]    Culture MODERATE STAPHYLOCOCCUS AUREUS  Final   Report Status 04/12/2019 FINAL  Final   Organism ID, Bacteria STAPHYLOCOCCUS AUREUS  Final      Susceptibility   Staphylococcus aureus - MIC*    CIPROFLOXACIN <=0.5 SENSITIVE Sensitive     ERYTHROMYCIN RESISTANT Resistant     GENTAMICIN <=0.5 SENSITIVE Sensitive     OXACILLIN 0.5 SENSITIVE Sensitive     TETRACYCLINE <=1 SENSITIVE Sensitive     VANCOMYCIN <=0.5 SENSITIVE Sensitive     TRIMETH/SULFA <=10 SENSITIVE Sensitive     CLINDAMYCIN RESISTANT Resistant     RIFAMPIN <=0.5 SENSITIVE Sensitive     Inducible Clindamycin POSITIVE Resistant     * MODERATE STAPHYLOCOCCUS AUREUS      Radiology Studies: No results found.   LOS: 9 days   Time spent: More than 50% of that time was spent in counseling and/or coordination of care.  Antonieta Pert, MD [facility]  May 18, 2019, 1:13 PM

## 2019-04-21 NOTE — Death Summary Note (Signed)
DEATH SUMMARY   Patient Details  Name: [name] MRN: [id number] DOB: [birth year]  Admission/Discharge Information   Admit Date:  2019/04/23  Date of Death: May 03, 2019  Time of Death: 14:00  Length of Stay: 04-12-22  Referring Physician: [name], MD   Reason(s) for Hospitalization  Found unresponsive  Diagnoses  Preliminary cause of death: Acute Encephalopathy Secondary Diagnoses (including complications and co-morbidities):  Acute encephalopathy/anoxic brain injury-hypoxic metabolic derangements.  No seizures on EEG but myoclonus. Seen by neurology, had intact brainstem reflexes but unable to elicit higher cortical function-suspected multifactorial toxic metabolic encephalopathy with component of hypoxic ischemic encephalopathy and anoxic brain injury. ESRD Acute respiratory failure with hypoxia  Aspiration pneumonia  Hypoxic brain injury  Advanced care planning/counseling discussion Morbid obesity with BMI 39 Type 2 diabetes mellitus/diabetic neuropathy CAD Hypertension Prosthetic right eye Severe back pain due to L4/L5 stenosis Anemia Arthritis Total Knee arthroplasty left  Brief Hospital Course (including significant findings, care, treatment, and services provided and events leading to death)  [name] is a 74 y.o. year old female with complex medical history including ESRD on dialysis T TTS, OSA on CPAP, CHF, diabetes mellitus, remote smoker admitted on 320 after being found unresponsive.  Patient was intubated in the ER and was admitted to ICU, work-up negative with MRI brain for acute process, presumed hypoxic ischemic encephalopathy.  Patient is having ongoing tremors with stimulation but unresponsive not following commands, on 3/28 terminally extubated for comfort.  Significant events: 03/02/2019 ER visit for arthritic type knee pain and history of fall with generalized weakness. Right knee was swollen and warm. Treated with as needed  hydrocodone 03/30/2019 ER visit for generalized weakness, dizziness and vertigo following dialysis.Marland Kitchen MRI negative. 04/05/2019 Hypoglycemia after dialysis. Brought in confused and disoriented to the ER 23-Apr-2022 Admit, after being found unresponsive, intubated in ER 3/22 Ongoing tremors with stimulation, unresponsive, not following commands 3/23 Failed PSV 3/28 terminal extubation and comfort care 05/03/22: Transferred to [facility] for ongoing comfort measures   Pertinent Labs and Studies  Significant Diagnostic Studies EEG  Result Date: 04/10/2019 Alexis Goodell, MD     04/10/2019 10:29 AM ELECTROENCEPHALOGRAM REPORT Patient: [name]       Room #: 4N31C EEG No. ID: [id number] Age: 74 y.o.        Sex: female Requesting Physician: Ramaswamy Report Date:  04/10/2019       Interpreting Physician: Alexis Goodell History: [name] is an 74 y.o. female with altered mental status Medications: Keppra, Fentanyl, Propofol, Cefepime, Insulin Conditions of Recording:  This is a 21 channel routine scalp EEG performed with bipolar and monopolar montages arranged in accordance to the international 10/20 system of electrode placement. One channel was dedicated to EKG recording. The patient is in the intubated and sedated state. Description:  The background activity is slow and poorly organized.  It consists of a low voltage, polymorphic delta activity that is continuous and diffusely distributed.  No epileptiform activity is noted.  The patient is stimulated during the recording with activation of the background activity noted. Hyperventilation and intermittent photic stimulation were not performed. IMPRESSION: This is an abnormal EEG secondary to general background slowing.  This finding may be seen with a diffuse disturbance that is etiologically nonspecific, but may include a metabolic encephalopathy or medication effect, among other possibilities.  No epileptiform activity is noted.  Alexis Goodell, MD  Neurology [phone] 04/10/2019, 10:25 AM   CT Head Wo Contrast  Result Date: April 23, 2019 CLINICAL DATA:  Altered mental status. Patient is on a ventilator. EXAM: CT HEAD WITHOUT CONTRAST TECHNIQUE: Contiguous axial images were obtained from the base of the skull through the vertex without intravenous contrast. COMPARISON:  None. FINDINGS: Brain: There is mild cerebral atrophy with widening of the extra-axial spaces and ventricular dilatation. There are areas of decreased attenuation within the white matter tracts of the supratentorial brain, consistent with microvascular disease changes. Vascular: No hyperdense vessel or unexpected calcification. Skull: Normal. Negative for fracture or focal lesion. Sinuses/Orbits: There is mild to moderate severity bilateral ethmoid sinus mucosal thickening. A prosthetic right globe is noted. This is present on the prior study. Other: Mild to moderate severity right parietal scalp soft tissue swelling is seen. This represents a new finding when compared to the prior exam. IMPRESSION: 1. Mild to moderate severity right parietal scalp soft tissue swelling. This represents a new finding when compared to the prior study dated March 30, 2019. 2. Findings of microvascular disease, and cerebral atrophy. 3. No acute intracranial process. 4. Ethmoid sinus mucosal thickening. Electronically Signed   By: Virgina Norfolk M.D.   On: 03/26/2019 21:48   CT HEAD WO CONTRAST  Result Date: 03/30/2019 CLINICAL DATA:  Headache, acute. EXAM: CT HEAD WITHOUT CONTRAST TECHNIQUE: Contiguous axial images were obtained from the base of the skull through the vertex without intravenous contrast. COMPARISON:  12/04/2014 FINDINGS: Brain: No evidence of acute infarction, hemorrhage, hydrocephalus, extra-axial collection or mass lesion/mass effect. Signs of chronic microvascular ischemic change and atrophy similar to the prior study. Vascular: No hyperdense vessel or unexpected calcification. Skull:  Coarsened trabeculation compatible with renal osteodystrophy, no acute findings. Sinuses/Orbits: Visualized paranasal sinuses are unremarkable. Signs of right globe resection with presumed prosthesis is un changed. Other: None. IMPRESSION: 1. No acute intracranial pathology. 2. Signs of chronic microvascular ischemic change and atrophy similar to the prior study. Electronically Signed   By: Zetta Bills M.D.   On: 03/30/2019 14:47   CT Cervical Spine Wo Contrast  Result Date: 03/26/2019 CLINICAL DATA:  Status post trauma. EXAM: CT CERVICAL SPINE WITHOUT CONTRAST TECHNIQUE: Multidetector CT imaging of the cervical spine was performed without intravenous contrast. Multiplanar CT image reconstructions were also generated. COMPARISON:  None. FINDINGS: Alignment: Normal. Skull base and vertebrae: No acute fracture. No primary bone lesion or focal pathologic process. Soft tissues and spinal canal: No prevertebral fluid or swelling. No visible canal hematoma. Disc levels: Very mild multilevel endplate sclerosis is seen with mild anterior osteophyte formation noted at the level of C5-C6. Mild multilevel intervertebral disc space narrowing is seen. Upper chest: The left lobe of the thyroid gland is markedly enlarged and heterogeneous in appearance. Moderate severity calcification of the left lobe of the thyroid gland is also seen. Other: An endotracheal tube is in place. IMPRESSION: 1. No acute fracture or subluxation identified. 2. Mild multilevel degenerative disc disease. 3. Markedly enlarged and heterogeneous left lobe of the thyroid gland. Recommend further evaluation with a nonemergent thyroid ultrasound. Electronically Signed   By: Virgina Norfolk M.D.   On: 04/08/2019 21:55   MR BRAIN WO CONTRAST  Result Date: 04/10/2019 CLINICAL DATA:  Initial evaluation for acute neuro deficit, stroke suspected. EXAM: MRI HEAD WITHOUT CONTRAST TECHNIQUE: Multiplanar, multiecho pulse sequences of the brain and  surrounding structures were obtained without intravenous contrast. COMPARISON:  Prior head CT from 04/18/2019 as well as recent MRI from 03/30/2019. FINDINGS: Brain: Mild diffuse prominence of the CSF containing spaces compatible with generalized age-related cerebral atrophy. Patchy T2/FLAIR hyperintensity within  the periventricular deep white matter both cerebral hemispheres most consistent with chronic small vessel ischemic disease, moderate in nature, and stable from previous. Superimposed remote lacunar infarcts present at the left basal ganglia, right thalamus, right pons, and right cerebellum. Encephalomalacia she a and gliosis involving the cortical subcortical left occipital lobe compatible with chronic left PCA territory infarct. Additional small chronic posterior right MCA territory infarcts seen involving the right postcentral gyrus. Appearance is stable from previous. No abnormal foci of restricted diffusion to suggest acute or subacute ischemia. Gray-white matter differentiation otherwise maintained. No foci of susceptibility artifact to suggest acute or chronic intracranial hemorrhage. No mass lesion, midline shift or mass effect. No hydrocephalus or extra-axial fluid collection. No made of a partially empty sella. Midline structures intact. Vascular: Major intracranial vascular flow voids are maintained. Skull and upper cervical spine: Craniocervical junction within normal limits. Diffusely decreased T1 signal intensity seen throughout the visualized bone marrow, likely related history of end-stage renal disease. No focal marrow replacing lesion. Diffuse soft tissue swelling seen throughout the right frontoparietal and temporal scalp. Sinuses/Orbits: Right ocular prosthesis noted. Patient status post ocular lens replacement on the left. Diffuse mucosal thickening with scattered air-fluid level seen throughout the paranasal sinuses. Patient appears to be intubated. Trace bilateral mastoid effusions  noted. Other: None. IMPRESSION: 1. No acute intracranial abnormality. 2. Diffuse soft tissue swelling throughout the right frontoparietal and temporal scalp. Query recent trauma. 3. 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 4. Underlying age-related cerebral atrophy with moderate chronic microvascular ischemic disease, with multiple additional remote lacunar infarcts as above. Electronically Signed   By: Jeannine Boga M.D.   On: 04/10/2019 00:47   MR BRAIN WO CONTRAST  Result Date: 03/30/2019 CLINICAL DATA:  Acute presentation with headache. EXAM: MRI HEAD WITHOUT CONTRAST TECHNIQUE: Multiplanar, multiecho pulse sequences of the brain and surrounding structures were obtained without intravenous contrast. COMPARISON:  CT same day. MRI 12/06/2014. FINDINGS: Brain: Diffusion imaging does not show any acute infarction. There chronic small-vessel ischemic changes of the pons. Few old small vessel cerebellar infarctions. Cerebral hemispheres show moderate chronic small-vessel changes of the white matter. Old lacunar infarction of the right thalamus and left basal ganglia. Small old left occipital cortical and subcortical infarction. No large vessel territory infarction. No mass lesion, hemorrhage, hydrocephalus or extra-axial collection. Vascular: Major vessels at the base of the brain show flow. Skull and upper cervical spine: Negative Sinuses/Orbits: Clear/normal Other: None IMPRESSION: No acute finding by MRI. Old left occipital cortical and subcortical infarction. Chronic small-vessel ischemic changes throughout the brain elsewhere as outlined above. Electronically Signed   By: Nelson Chimes M.D.   On: 03/30/2019 17:35   DG CHEST PORT 1 VIEW  Result Date: 04/14/2019 CLINICAL DATA:  Acute respiratory failure.  Hypoxia. EXAM: PORTABLE CHEST 1 VIEW COMPARISON:  04/13/2019. FINDINGS: Endotracheal tube, left IJ line, feeding tube in stable position. Cardiomegaly. Progressive diffuse  bilateral interstitial prominence noted consistent with interstitial edema. Pneumonitis can not be excluded. Small left pleural effusion cannot be excluded. No pneumothorax. IMPRESSION: 1.  Lines and tubes stable position. 2. Cardiomegaly with progressive diffuse bilateral interstitial prominence consistent with interstitial edema. Pneumonitis can not be excluded. Small left pleural effusion cannot be excluded. Electronically Signed   By: Marcello Moores  Register   On: 04/14/2019 06:03   DG CHEST PORT 1 VIEW  Result Date: 04/13/2019 CLINICAL DATA:  Acute respiratory failure EXAM: PORTABLE CHEST 1 VIEW COMPARISON:  April 12, 2019 FINDINGS: The heart size and mediastinal contours  are unchanged with mild cardiomegaly. ETT is 4 cm above the level of the carina. A left-sided central venous catheter seen in the upper SVC. NG tube is seen below the diaphragm. There is prominence of the central pulmonary vasculature. Mildly increased interstitial markings are seen throughout both lungs, with interval improvement of the degree of aeration. No acute osseous abnormality. IMPRESSION: Pulmonary vascular congestion and interstitial edema with interval improvement from the prior exam. Stable lines and tubes. Electronically Signed   By: Prudencio Pair M.D.   On: 04/13/2019 05:52   DG Chest Port 1 View  Result Date: 04/12/2019 CLINICAL DATA:  Acute respiratory failure EXAM: PORTABLE CHEST 1 VIEW COMPARISON:  Yesterday FINDINGS: Endotracheal tube tip is just below the clavicular heads. The trachea is displaced to the right by a large thyroid nodule based on 01/26/2018 chest CT. Left IJ line with tip at the upper SVC. The enteric tube reaches the stomach. Cardiomegaly with congested appearance of vessels. No visible effusion or pneumothorax. IMPRESSION: 1. Stable hardware positioning. 2. Low volume chest with cardiomegaly and vascular congestion/edema. Electronically Signed   By: Monte Fantasia M.D.   On: 04/12/2019 05:49   DG Chest  Port 1 View  Result Date: 04/11/2019 CLINICAL DATA:  Respiratory failure EXAM: PORTABLE CHEST 1 VIEW COMPARISON:  April 09, 2019 FINDINGS: The heart size and mediastinal contours are unchanged with cardiomegaly. Aortic knob calcifications. ETT is 2.5 cm above the carina. A left-sided central venous catheter seen with the tip at the brachiocephalic SVC junction. There are mildly increased interstitial markings seen throughout both lungs. There is improved aeration of the right lung. No acute osseous abnormality. IMPRESSION: Lines and tubes in satisfactory position. Findings suggestive of interstitial edema. Improved aeration of the right lung. Electronically Signed   By: Prudencio Pair M.D.   On: 04/11/2019 06:34   DG Chest Portable 1 View  Result Date: 03/31/2019 CLINICAL DATA:  Central line placement EXAM: PORTABLE CHEST 1 VIEW COMPARISON:  03/12/2019 FINDINGS: Left central line is been placed. The tip is in the upper SVC. Endotracheal tube is unchanged. Interval removal of NG tube. Cardiomegaly. Improved aeration in the right upper lobe. Continued right upper lobe atelectasis or infiltrate. Vascular congestion. No effusions or acute bony abnormality. IMPRESSION: Left central line tip in the upper SVC. No pneumothorax. Interval removal of NG tube. Improving aeration in the right upper lobe. Cardiomegaly, vascular congestion. Electronically Signed   By: Rolm Baptise M.D.   On: 04/04/2019 23:47   DG Chest Portable 1 View  Result Date: 04/07/2019 CLINICAL DATA:  Nasogastric tube placement. EXAM: PORTABLE CHEST 1 VIEW COMPARISON:  None. FINDINGS: An endotracheal tube is seen with its distal tip approximately 3.5 cm from the carina. A nasogastric tube is noted. Its distal end is limited in visualization secondary to image cut off and appears to sit within the distal aspect of the esophagus. There is marked severity opacifications of the right upper lobe which represents a new finding when compared to the prior  exam. Mild atelectasis and/or infiltrate is seen within the bilateral lung bases. The costophrenic angles are limited in evaluation secondary to image con off. The cardiac silhouette is enlarged and unchanged in size. Degenerative changes are seen throughout the thoracic spine. IMPRESSION: 1. Endotracheal tube and nasogastric tube as described above. Further advancement of the NG tube by approximately 8 cm is recommended to decrease the risk of aspiration. 2. Marked severity opacification of the right upper lobe which represents a new finding when  compared to prior exam and is likely consistent with marked severity atelectasis and/or infiltrate. Electronically Signed   By: Virgina Norfolk M.D.   On: 03/22/2019 22:43   DG Chest Portable 1 View  Result Date: 04/08/2019 CLINICAL DATA:  Intubation EXAM: PORTABLE CHEST 1 VIEW COMPARISON:  12/31/2018 FINDINGS: NG tube coiled. This will likely in the esophagus need to be completely removed and replaced. Endotracheal tube tip is 4 cm above the carina. Cardiomegaly with vascular congestion. Patchy airspace disease in the right lung. No effusions or acute bony abnormality. IMPRESSION: NG tube coils in the esophagus and likely will need to be completely removed and replaced. Cardiomegaly, vascular congestion. Patchy right lung airspace disease concerning for pneumonia. Electronically Signed   By: Rolm Baptise M.D.   On: 03/22/2019 21:30   DG Abd Portable 1V  Result Date: 04/10/2019 CLINICAL DATA:  Orogastric tube placement. EXAM: PORTABLE ABDOMEN - 1 VIEW COMPARISON:  April 09, 2019 FINDINGS: Endotracheal tube is seen with its distal tip approximately 3.7 cm from the carina. A nasogastric tube is noted with its distal end overlying the mid left abdomen. A left internal jugular venous catheter is seen. Its distal tip overlies the midline at the level of the carina. The bowel gas pattern is normal. No radio-opaque calculi or other significant radiographic abnormality  are seen. IMPRESSION: Nasogastric tube positioning, as described above. Electronically Signed   By: Virgina Norfolk M.D.   On: 04/10/2019 22:27   DG Abd Portable 1 View  Result Date: 04/11/2019 CLINICAL DATA:  Status post orogastric tube placement. EXAM: PORTABLE ABDOMEN - 1 VIEW COMPARISON:  April 24, 2010 FINDINGS: The bowel gas pattern is normal. A nasogastric tube is not identified. No radio-opaque calculi or other significant radiographic abnormality are seen. Radiopaque surgical coils are seen overlying the pelvis. IMPRESSION: A nasogastric tube is not identified. Electronically Signed   By: Virgina Norfolk M.D.   On: 04/06/2019 22:44   Overnight EEG with video  Result Date: 04/11/2019 Lora Havens, MD     04/11/2019 10:40 AM Patient Name: [name] MRN: [id number] Epilepsy Attending: Lora Havens Referring Physician/Provider: Dr Trevor Mace Duration: 04/10/2019 0953 to 04/11/2019 1005 Total Duration of study: 24 hours Patient history: 74yo F who was found down. EEG to evaluate for ams, seizure. Level of alertness: comatose AEDs during EEG study: propofol, keppra Technical aspects: This EEG study was done with scalp electrodes positioned according to the 10-20 International system of electrode placement. Electrical activity was acquired at a sampling rate of 500Hz  and reviewed with a high frequency filter of 70Hz  and a low frequency filter of 1Hz . EEG data were recorded continuously and digitally stored. DESCRIPTION:  EEG showed continuous generalized 2-3Hz  low amplitude delta slowing. EEG was reactive to tactile stimulation. Hyperventilation and photic stimulation were not performed Event button was pressed on 04/10/2019 at 1545 and 04/11/2019 0819 for axial stiffening. Concomitant EEG before, during and after the event didn't show any eeg change to suggest seizure.     IMPRESSION: This study is suggestive of moderate to severe diffuse encephalopathy, non specific to etiology. No  seizures or epileptiform discharges were seen throughout the recording. Event button was pressed twice as described above for episodes of axial stiffening without eeg change and were likely not epileptic.   ECHOCARDIOGRAM COMPLETE  Result Date: 04/13/2019    ECHOCARDIOGRAM REPORT   Patient Name:   [name] Date of Exam: 04/12/2019 Medical Rec #:  [id number]  Height:       67.0 in Accession #:    [id number]      Weight:       269.2 lb Date of Birth:  Apr 03, 1945       BSA:          2.294 m Patient Age:    62 years        BP:           105/58 mmHg Patient Gender: F               HR:           97 bpm. Exam Location:  Inpatient Procedure: 2D Echo, Cardiac Doppler and Color Doppler Indications:     I51.7 Cardiomegaly  History:         Patient has prior history of Echocardiogram examinations, most                  recent 06/18/2018. CHF, CAD and Previous Myocardial Infarction,                  Signs/Symptoms:Dyspnea and Murmur; Risk Factors:Hypertension,                  Diabetes, Sleep Apnea and GERD. ESRD. Dysrhythmia.  Sonographer:     Jonelle Sidle Dance Referring Phys:  [place] Diagnosing Phys: Jenkins Rouge MD  Sonographer Comments: Echo performed with patient supine and on artificial respirator. IMPRESSIONS  1. Left ventricular ejection fraction, by estimation, is 60 to 65%. The left ventricle has normal function. The left ventricle has no regional wall motion abnormalities. There is mild left ventricular hypertrophy. Left ventricular diastolic parameters are indeterminate.  2. Right ventricular systolic function is normal. The right ventricular size is normal. There is moderately elevated pulmonary artery systolic pressure.  3. Left atrial size was severely dilated.  4. The mitral valve is degenerative. Trivial mitral valve regurgitation. Mild mitral stenosis.  5. AS has progressed since echo done 06/18/18 mean gradient 9-21 mmHg peak 19-> 51 mmHg . The aortic valve is tricuspid. Aortic valve  regurgitation is not visualized. Moderate aortic valve stenosis.  6. The inferior vena cava is normal in size with greater than 50% respiratory variability, suggesting right atrial pressure of 3 mmHg. FINDINGS  Left Ventricle: Left ventricular ejection fraction, by estimation, is 60 to 65%. The left ventricle has normal function. The left ventricle has no regional wall motion abnormalities. The left ventricular internal cavity size was normal in size. There is  mild left ventricular hypertrophy. Left ventricular diastolic parameters are indeterminate. Right Ventricle: The right ventricular size is normal. No increase in right ventricular wall thickness. Right ventricular systolic function is normal. There is moderately elevated pulmonary artery systolic pressure. The tricuspid regurgitant velocity is 3.03 m/s, and with an assumed right atrial pressure of 8 mmHg, the estimated right ventricular systolic pressure is 37.8 mmHg. Left Atrium: Left atrial size was severely dilated. Right Atrium: Right atrial size was normal in size. Pericardium: There is no evidence of pericardial effusion. Mitral Valve: The mitral valve is degenerative in appearance. There is severe thickening of the mitral valve leaflet(s). There is severe calcification of the mitral valve leaflet(s). Normal mobility of the mitral valve leaflets. Severe mitral annular calcification. Trivial mitral valve regurgitation. Mild mitral valve stenosis. Tricuspid Valve: The tricuspid valve is normal in structure. Tricuspid valve regurgitation is mild . No evidence of tricuspid stenosis. Aortic Valve: AS has progressed since echo done 06/18/18 mean gradient 9-21  mmHg peak 19-> 51 mmHg. The aortic valve is tricuspid. . There is moderate thickening and moderate calcification of the aortic valve. Aortic valve regurgitation is not visualized.  Moderate aortic stenosis is present. There is moderate thickening of the aortic valve. There is moderate calcification of the  aortic valve. Aortic valve mean gradient measures 22.0 mmHg. Aortic valve peak gradient measures 45.2 mmHg. Aortic valve area, by VTI measures 1.22 cm. Pulmonic Valve: The pulmonic valve was normal in structure. Pulmonic valve regurgitation is not visualized. No evidence of pulmonic stenosis. Aorta: The aortic root is normal in size and structure. Venous: The inferior vena cava is normal in size with greater than 50% respiratory variability, suggesting right atrial pressure of 3 mmHg. IAS/Shunts: No atrial level shunt detected by color flow Doppler.  LEFT VENTRICLE PLAX 2D LVIDd:         4.56 cm LVIDs:         3.21 cm LV PW:         1.61 cm LV IVS:        1.17 cm LVOT diam:     1.70 cm LV SV:         65 LV SV Index:   28 LVOT Area:     2.27 cm  RIGHT VENTRICLE             IVC RV Basal diam:  3.68 cm     IVC diam: 2.04 cm RV Mid diam:    2.14 cm RV S prime:     11.60 cm/s TAPSE (M-mode): 2.1 cm LEFT ATRIUM              Index       RIGHT ATRIUM           Index LA diam:        5.30 cm  2.31 cm/m  RA Area:     16.70 cm LA Vol (A2C):   118.0 ml 51.43 ml/m RA Volume:   39.80 ml  17.35 ml/m LA Vol (A4C):   131.0 ml 57.10 ml/m LA Biplane Vol: 126.0 ml 54.92 ml/m  AORTIC VALVE AV Area (Vmax):    1.13 cm AV Area (Vmean):   1.16 cm AV Area (VTI):     1.22 cm AV Vmax:           336.00 cm/s AV Vmean:          207.667 cm/s AV VTI:            0.534 m AV Peak Grad:      45.2 mmHg AV Mean Grad:      22.0 mmHg LVOT Vmax:         168.00 cm/s LVOT Vmean:        106.500 cm/s LVOT VTI:          0.286 m LVOT/AV VTI ratio: 0.54  AORTA Ao Root diam: 3.20 cm Ao Asc diam:  2.70 cm MITRAL VALVE                TRICUSPID VALVE MV Area (PHT): 2.07 cm     TR Peak grad:   36.7 mmHg MV Decel Time: 366 msec     TR Vmax:        303.00 cm/s MV E velocity: 154.00 cm/s MV A velocity: 149.00 cm/s  SHUNTS MV E/A ratio:  1.03         Systemic VTI:  0.29 m  Systemic Diam: 1.70 cm Jenkins Rouge MD Electronically signed by  Jenkins Rouge MD Signature Date/Time: 04/13/2019/1:44:29 PM    Final     Microbiology Recent Results (from the past 240 hour(s))  Respiratory Panel by RT PCR (Flu A&B, Covid) - Nasopharyngeal Swab     Status: None   Collection Time: 03/31/2019  9:28 PM   Specimen: Nasopharyngeal Swab  Result Value Ref Range Status   SARS Coronavirus 2 by RT PCR NEGATIVE NEGATIVE Final    Comment: (NOTE) SARS-CoV-2 target nucleic acids are NOT DETECTED. The SARS-CoV-2 RNA is generally detectable in upper respiratoy specimens during the acute phase of infection. The lowest concentration of SARS-CoV-2 viral copies this assay can detect is 131 copies/mL. A negative result does not preclude SARS-Cov-2 infection and should not be used as the sole basis for treatment or other patient management decisions. A negative result may occur with  improper specimen collection/handling, submission of specimen other than nasopharyngeal swab, presence of viral mutation(s) within the areas targeted by this assay, and inadequate number of viral copies (<131 copies/mL). A negative result must be combined with clinical observations, patient history, and epidemiological information. The expected result is Negative. Fact Sheet for Patients:  PinkCheek.be Fact Sheet for Healthcare Providers:  GravelBags.it This test is not yet ap proved or cleared by the [place] FDA and  has been authorized for detection and/or diagnosis of SARS-CoV-2 by FDA under an Emergency Use Authorization (EUA). This EUA will remain  in effect (meaning this test can be used) for the duration of the COVID-19 declaration under Section 564(b)(1) of the Act, 21 U.S.C. section 360bbb-3(b)(1), unless the authorization is terminated or revoked sooner.    Influenza A by PCR NEGATIVE NEGATIVE Final   Influenza B by PCR NEGATIVE NEGATIVE Final    Comment: (NOTE) The Xpert Xpress SARS-CoV-2/FLU/RSV  assay is intended as an aid in  the diagnosis of influenza from Nasopharyngeal swab specimens and  should not be used as a sole basis for treatment. Nasal washings and  aspirates are unacceptable for Xpert Xpress SARS-CoV-2/FLU/RSV  testing. Fact Sheet for Patients: PinkCheek.be Fact Sheet for Healthcare Providers: GravelBags.it This test is not yet approved or cleared by the [place] FDA and  has been authorized for detection and/or diagnosis of SARS-CoV-2 by  FDA under an Emergency Use Authorization (EUA). This EUA will remain  in effect (meaning this test can be used) for the duration of the  Covid-19 declaration under Section 564(b)(1) of the Act, 21  U.S.C. section 360bbb-3(b)(1), unless the authorization is  terminated or revoked. Performed at [facility], [place] [address], [place], [place] [address]   MRSA PCR Screening     Status: None   Collection Time: 04/10/19  2:03 AM   Specimen: Nasal Mucosa; Nasopharyngeal  Result Value Ref Range Status   MRSA by PCR NEGATIVE NEGATIVE Final    Comment:        The GeneXpert MRSA Assay (FDA approved for NASAL specimens only), is one component of a comprehensive MRSA colonization surveillance program. It is not intended to diagnose MRSA infection nor to guide or monitor treatment for MRSA infections. Performed at [facility], [place] [address], [place], [place] [address]   Culture, blood (routine x 2)     Status: None   Collection Time: 04/10/19  5:25 AM   Specimen: BLOOD  Result Value Ref Range Status   Specimen Description BLOOD LEFT ANTECUBITAL  Final   Special Requests  Final    BOTTLES DRAWN AEROBIC ONLY Blood Culture results may not be optimal due to an inadequate volume of blood received in culture bottles   Culture   Final    NO GROWTH 5 DAYS Performed at [facility], [place] [address], [place], [place] [address]    Report Status 04/15/2019  FINAL  Final  Culture, blood (routine x 2)     Status: None   Collection Time: 04/10/19  5:38 AM   Specimen: BLOOD LEFT ARM  Result Value Ref Range Status   Specimen Description BLOOD LEFT ARM  Final   Special Requests   Final    BOTTLES DRAWN AEROBIC ONLY Blood Culture results may not be optimal due to an inadequate volume of blood received in culture bottles   Culture   Final    NO GROWTH 5 DAYS Performed at [facility], [place] [address] [place], [place] [address]    Report Status 04/15/2019 FINAL  Final  Culture, respiratory (tracheal aspirate)     Status: None   Collection Time: 04/10/19 10:21 AM   Specimen: Tracheal Aspirate; Respiratory  Result Value Ref Range Status   Specimen Description TRACHEAL ASPIRATE  Final   Special Requests NONE  Final   Gram Stain   Final    FEW WBC PRESENT,BOTH PMN AND MONONUCLEAR MODERATE GRAM POSITIVE COCCI IN PAIRS IN CLUSTERS Performed at [facility], [address], [place], [place] [address]    Culture MODERATE STAPHYLOCOCCUS AUREUS  Final   Report Status 04/12/2019 FINAL  Final   Organism ID, Bacteria STAPHYLOCOCCUS AUREUS  Final      Susceptibility   Staphylococcus aureus - MIC*    CIPROFLOXACIN <=0.5 SENSITIVE Sensitive     ERYTHROMYCIN RESISTANT Resistant     GENTAMICIN <=0.5 SENSITIVE Sensitive     OXACILLIN 0.5 SENSITIVE Sensitive     TETRACYCLINE <=1 SENSITIVE Sensitive     VANCOMYCIN <=0.5 SENSITIVE Sensitive     TRIMETH/SULFA <=10 SENSITIVE Sensitive     CLINDAMYCIN RESISTANT Resistant     RIFAMPIN <=0.5 SENSITIVE Sensitive     Inducible Clindamycin POSITIVE Resistant     * MODERATE STAPHYLOCOCCUS AUREUS    Lab Basic Metabolic Panel: Recent Labs  Lab 04/12/19 2000 04/13/19 0331 04/14/19 0412 04/16/19 1351  NA  --  135 137 137  K  --  4.8 4.9 5.1  CL  --  95* 95* 96*  CO2  --  27 26 25   GLUCOSE  --  116* 102* 94  BUN  --  57* 81* 106*  CREATININE  --  6.70* 8.41* 8.47*  CALCIUM  --  8.6* 9.4 9.5  MG  2.0 2.2  --   --   PHOS 6.0* 7.0*  --  8.7*   Liver Function Tests: Recent Labs  Lab 04/13/19 0331 04/16/19 1351  AST 14*  --   ALT 13  --   ALKPHOS 43  --   BILITOT 0.6  --   PROT 6.1*  --   ALBUMIN 2.2* 2.2*   No results for input(s): LIPASE, AMYLASE in the last 168 hours. No results for input(s): AMMONIA in the last 168 hours. CBC: Recent Labs  Lab 04/13/19 0331 04/14/19 0412 04/16/19 1351  WBC 7.0 10.6* 7.4  HGB 9.6* 9.8* 8.5*  HCT 30.3* 31.2* 26.5*  MCV 86.1 86.0 86.3  PLT 154 190 212   Cardiac Enzymes: Recent Labs  Lab 04/13/19 0331  CKTOTAL 73  CKMB 1.9  Sepsis Labs: Recent Labs  Lab 04/13/19 0000 04/13/19 0331 04/14/19 0412 04/16/19 1351  WBC  --  7.0 10.6* 7.4  LATICACIDVEN 1.0  --   --   --     Procedures/Operations  See note   Antonieta Pert 2019/04/27, 3:03 PM

## 2019-04-21 NOTE — Progress Notes (Signed)
Daily Progress Note   Patient Name: [name]       Date: 2019-04-22 DOB: 11/11/1945  Age: 74 y.o. MRN#: [id number] Attending Physician: Antonieta Pert, MD Primary Care Physician: [name], MD Admit Date: 03/26/2019  Reason for Consultation/Follow-up: Establishing goals of care and Terminal Care  Subjective: Patient non responsive. Daughter, Nevin Bloodgood at bedside. Nevin Bloodgood expressed all the signs of end of life that she is seeing in her mother- cool extremities, loss of jaw control. She expressed concern that her mom seems to be intermittently convulsing. Paula's main goal of care is for her mom to die peacefully.   Review of Systems  Unable to perform ROS: Patient unresponsive    Length of Stay: 9  Current Medications: Scheduled Meds:  . glycopyrrolate  0.4 mg Intravenous Q2H  . LORazepam  2 mg Intravenous Q4H    Continuous Infusions: . fentaNYL infusion INTRAVENOUS 180 mcg/hr (04/18/19 2326)    PRN Meds: acetaminophen **OR** acetaminophen, antiseptic oral rinse, fentaNYL, fentaNYL (SUBLIMAZE) injection, polyvinyl alcohol  Physical Exam Vitals and nursing note reviewed.  Pulmonary:     Comments: RR irregular Skin:    General: Skin is warm and dry.  Neurological:     Comments: unresponsive             Vital Signs: BP (!) 105/32 (BP Location: Right Wrist)   Pulse 87   Temp 99.3 F (37.4 C) (Axillary)   Resp 18   Ht 5\' 7"  (1.702 m)   Wt 113.2 kg   SpO2 (!) 79%   BMI 39.09 kg/m  SpO2: SpO2: (!) 79 % O2 Device: O2 Device: Room Air O2 Flow Rate: O2 Flow Rate (L/min): 20 L/min  Intake/output summary:   Intake/Output Summary (Last 24 hours) at 04-22-19 1246 Last data filed at 22-Apr-2019 0712 Gross per 24 hour  Intake 0 ml  Output 0 ml  Net 0 ml   LBM: Last BM  Date: 04/10/19 Baseline Weight: Weight: 120.1 kg Most recent weight: Weight: 113.2 kg       Palliative Assessment/Data: PPS: 10%      Patient Active Problem List   Diagnosis Date Noted  . Comfort measures only status   . Palliative care by specialist   . Goals of care, counseling/discussion   . Terminal care   . Aspiration pneumonia ([place])   .  Acute respiratory failure with hypoxia ([place]) 04/10/2019  . Altered mental status   . Fluid overload 10/05/2018  . Shortness of breath 06/17/2018  . Acute left systolic heart failure ([place]) 06/17/2018  . Eye pain, left 01/25/2018  . Acute bronchitis due to Rhinovirus 01/18/2018  . Respiratory distress, acute 01/18/2018  . Essential hypertension 01/17/2018  . HLD (hyperlipidemia) 01/17/2018  . Chronic diastolic CHF (congestive heart failure) ([place]) 01/16/2018  . Centrilobular emphysema ([place]) 01/16/2018  . Acute bronchitis 01/16/2018  . Heme + stool 08/24/2017  . Iron deficiency anemia, unspecified 08/24/2017  . Chest pain 04/06/2017  . Unstable angina ([place]) 04/02/2017  . Hypoxemia 10/13/2016  . Acute on chronic combined systolic and diastolic CHF (congestive heart failure) ([place]) 10/13/2016  . Anemia due to chronic kidney disease 10/13/2016  . Acute respiratory failure ([place]) 10/13/2016  . Acute metabolic encephalopathy   . Tremor   . Weakness   . Acute hyperkalemia 12/04/2014  . Neurological deficit present 12/04/2014  . Hypercalcemia 12/04/2014  . OSA    . Myocardial infarction ([place])   . Blind right eye   . Coronary artery disease   . CHF (congestive heart failure) ([place])   . ESRD (end stage renal disease) ([place])   . Hyperkalemia 08/05/2014  . Type II diabetes mellitus with renal manifestations ([place]) 01/18/2014  . Arm pain 02/27/2012  . Other complications due to renal dialysis device, implant, and graft 02/13/2012  . Pain in limb 11/21/2011  . Swollen limb 11/21/2011  . Itching 11/21/2011  . End stage renal disease ([place])  11/07/2011  . Chronic kidney disease, stage IV (severe) ([place]) 10/24/2011  . Chronic kidney disease (CKD), stage IV (severe) ([place]) 08/29/2011    Palliative Care Assessment & Plan   Patient Profile: 20 you female with history of ESRD on HD, admitted 3/20 after being found down, intubated. Continued to have poor neuro recovery with presumed anoxic brain injury. Extubated to comfort. Palliative medicine assisting with terminal care.     Assessment/Recommendations/Plan   Actively dying- not stable for transfer out of facility  Start lorazepam 2mg  IV q4 hours for possible seizures-apparently midazolem infusion was ordered to begin on 3/28 but was not hung- will d/c this order  Continue current fentanyl infusion and bolus doses  Increase robinul .44mcg to q2 hours- per RN secretions are increasing   Goals of Care and Additional Recommendations:  Limitations on Scope of Treatment: Full Comfort Care  Code Status:  DNR  Prognosis:   Hours - Days  Discharge Planning:  Anticipated Hospital Death  Care plan was discussed with patient's RN and patient's daughter.   Thank you for allowing the Palliative Medicine Team to assist in the care of this patient.   Time In: 1215 Time Out: 1250 Total Time 35 mins Prolonged Time Billed no      Greater than 50%  of this time was spent counseling and coordinating care related to the above assessment and plan.  Mariana Kaufman, AGNP-C Palliative Medicine   Please contact Palliative Medicine Team phone at [phone] for questions and concerns.

## 2019-04-21 NOTE — Social Work (Signed)
CSW acknowledging pt arrival under comfort care.  Will follow for disposition should hospice services or residential hospice become appropriate.   Alexander Mt, MSW, [place] Work

## 2019-04-21 NOTE — Progress Notes (Signed)
Pt certified as deceased at 1400hrs. Second RN Roosvelt Maser verified death. Family at bedside. [place] donor service already provided reference number prior to pt being transferred to this unit

## 2019-04-21 NOTE — Progress Notes (Signed)
Nutrition Brief Note  Chart reviewed. Pt now transitioning to comfort care.  No further nutrition interventions warranted at this time.  Please re-consult as needed.   Aunika Kirsten W, RD, LDN, CDCES Registered Dietitian II Certified Diabetes Care and Education Specialist Please refer to AMION for RD and/or RD on-call/weekend/after hours pager  

## 2019-04-21 DEATH — deceased

## 2019-05-26 IMAGING — DX DG CHEST 1V PORT
1 series · 1 of 1 positions shown · non-contrast
Comparison: 04/02/2017

CLINICAL DATA: Fever x1 day. SOB, cough x3 days. Pt states
sometimes the cough is productive and other times it is dry. Hx of
HTN, DM, CHF. Hx of ESRD, MI. Ex-smoker, quit APPROX.40 years ago.

EXAM:
PORTABLE CHEST 1 VIEW

[chest]
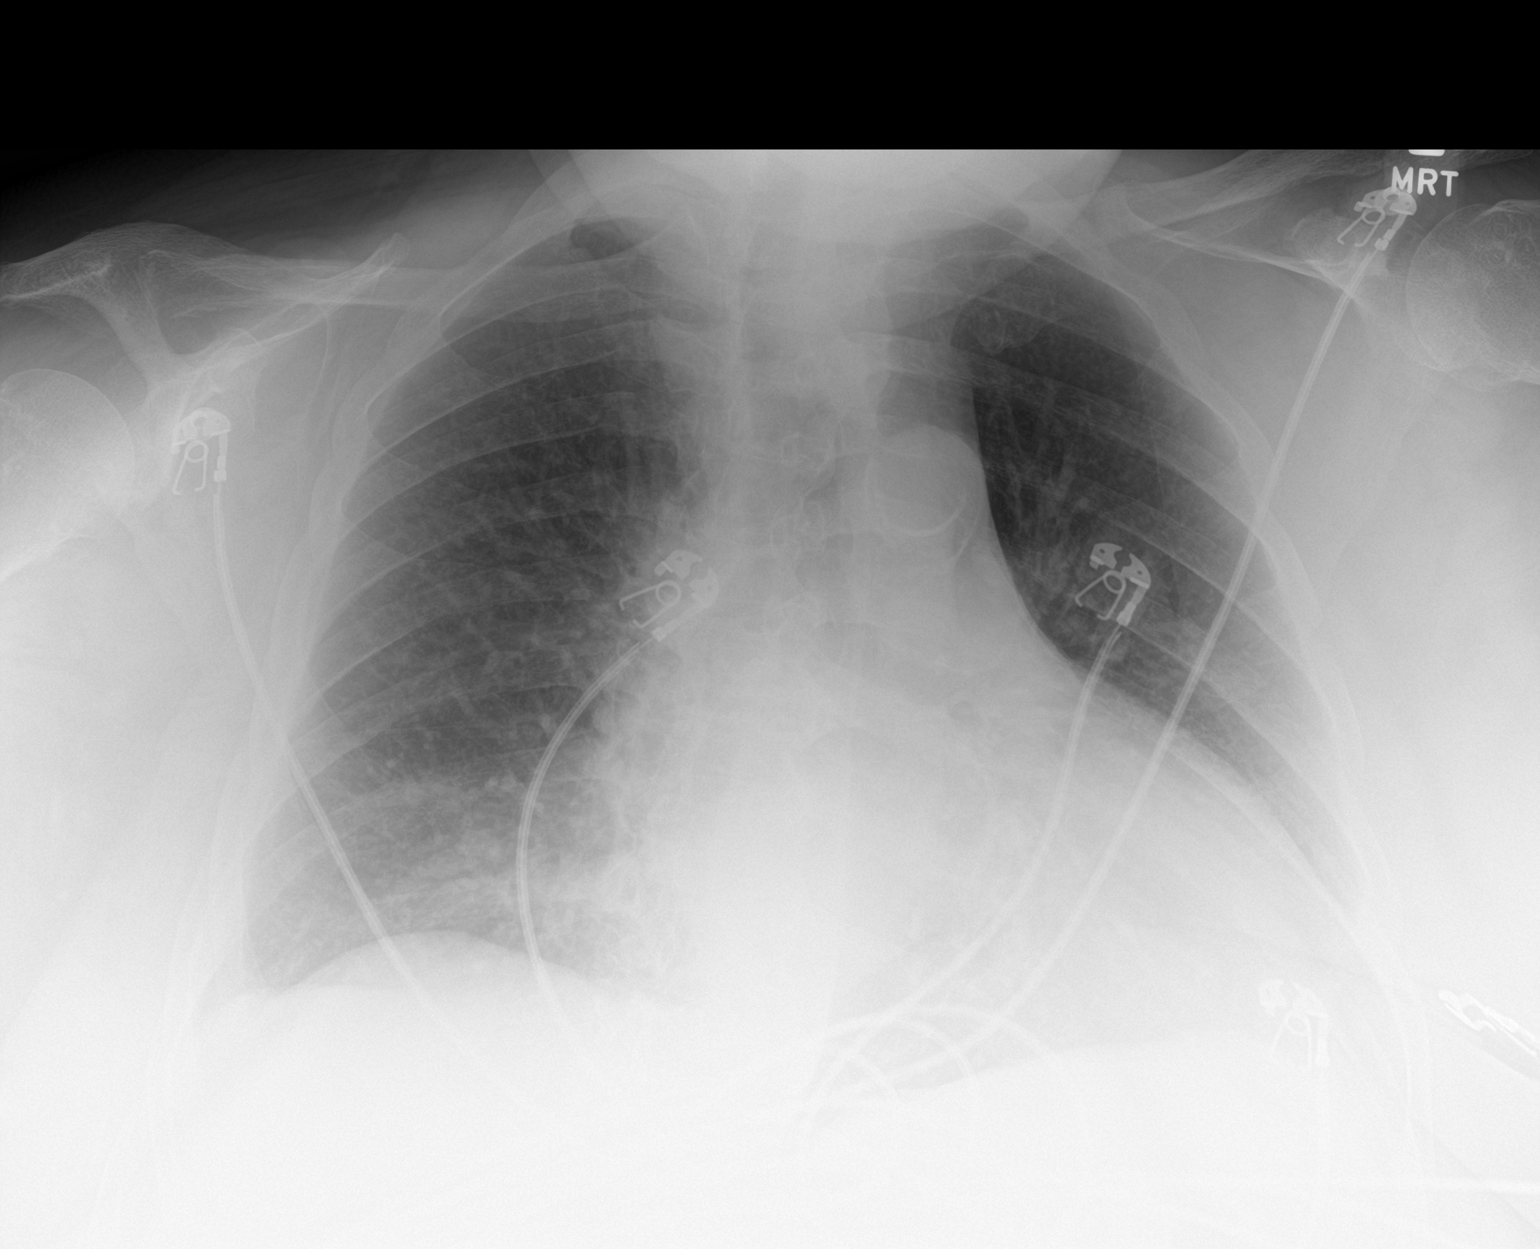

[1 of 1 positions shown; findings below may reference images not displayed]

FINDINGS: The heart is enlarged. There is atherosclerotic calcification of the
thoracic aorta. No focal consolidation. No pulmonary edema. Interval
removal of dialysis catheter.
IMPRESSION: Cardiomegaly.

## 2019-05-30 IMAGING — DX DG CHEST 1V PORT
1 series · 1 of 1 positions shown · non-contrast
Comparison: Radiograph January 16, 2018.

CLINICAL DATA: Dyspnea.

EXAM:
PORTABLE CHEST 1 VIEW

[chest ap]
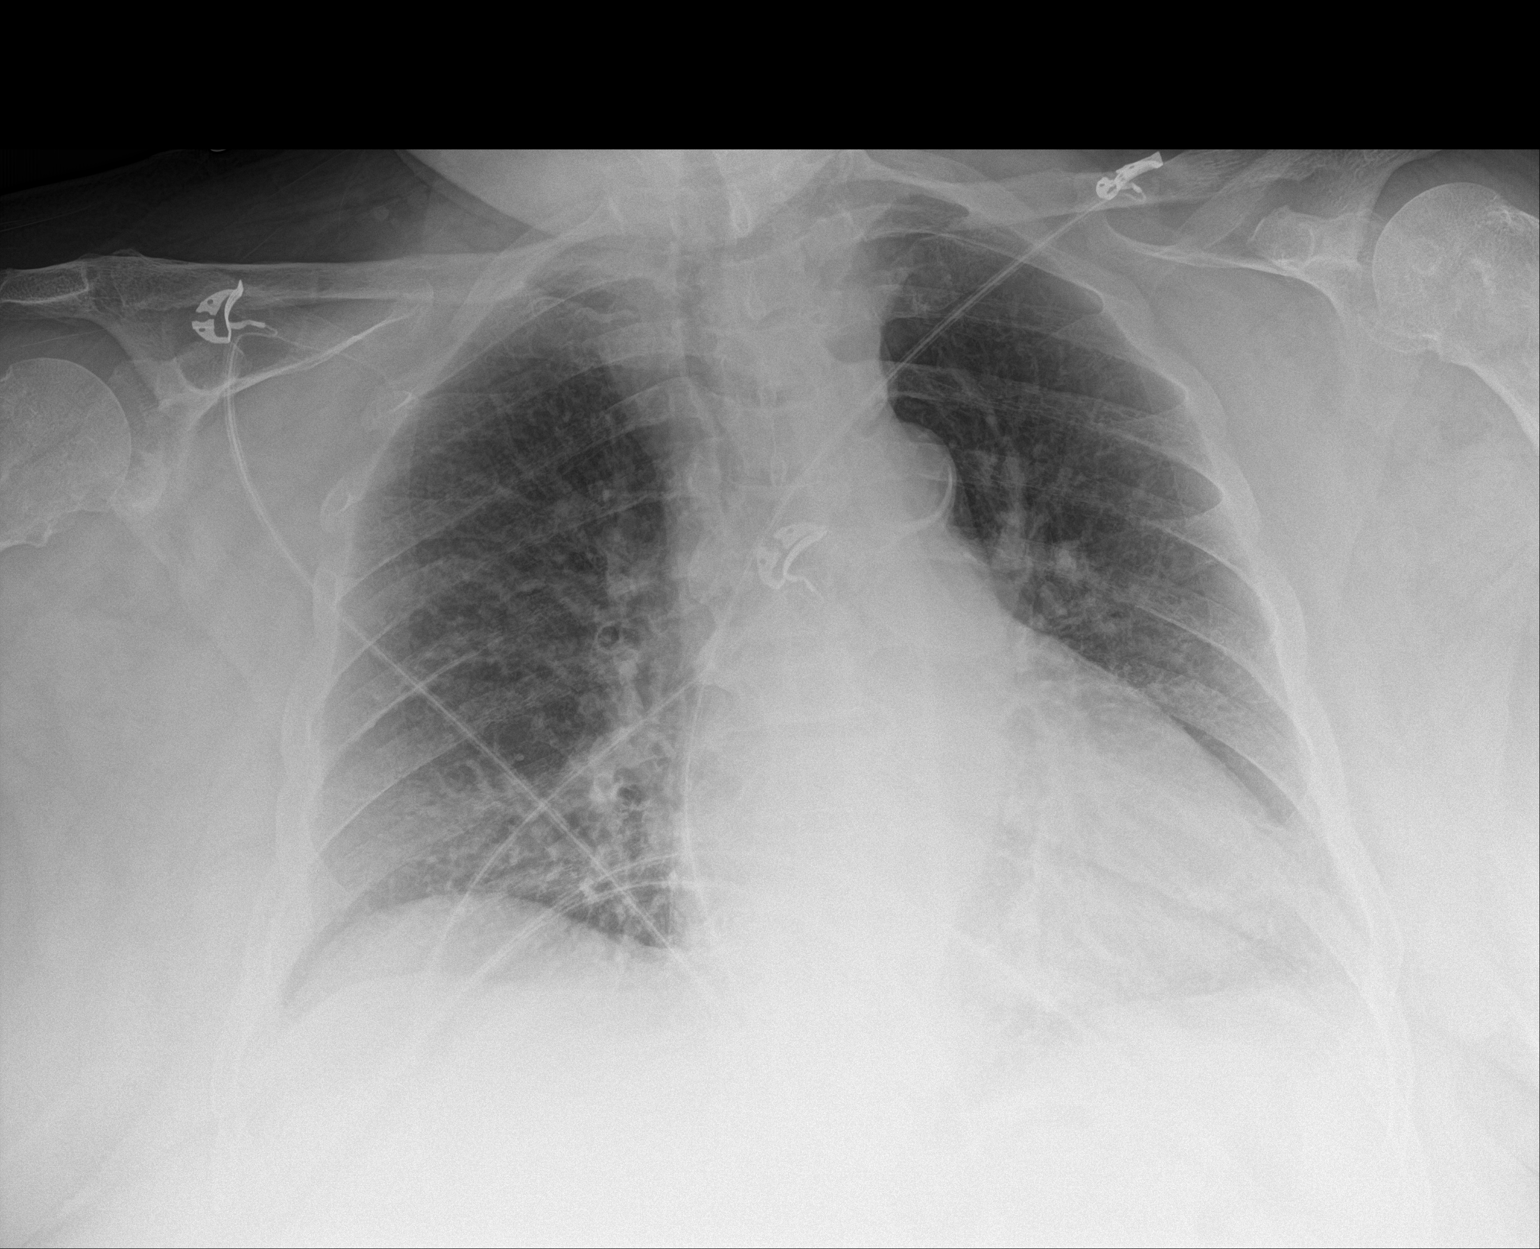

[1 of 1 positions shown; findings below may reference images not displayed]

FINDINGS: Stable cardiomegaly. Atherosclerosis of thoracic aorta is noted. No
pneumothorax or pleural effusion is noted. No acute pulmonary
disease is noted. Bony thorax is unremarkable.
IMPRESSION: No acute cardiopulmonary abnormality seen.

Aortic Atherosclerosis ([id number].Q).

## 2019-06-02 IMAGING — DX DG CHEST 1V PORT
1 series · 1 of 1 positions shown · non-contrast
Comparison: 01/20/2018

CLINICAL DATA: Shortness of breath.

EXAM:
PORTABLE CHEST 1 VIEW

[chest]
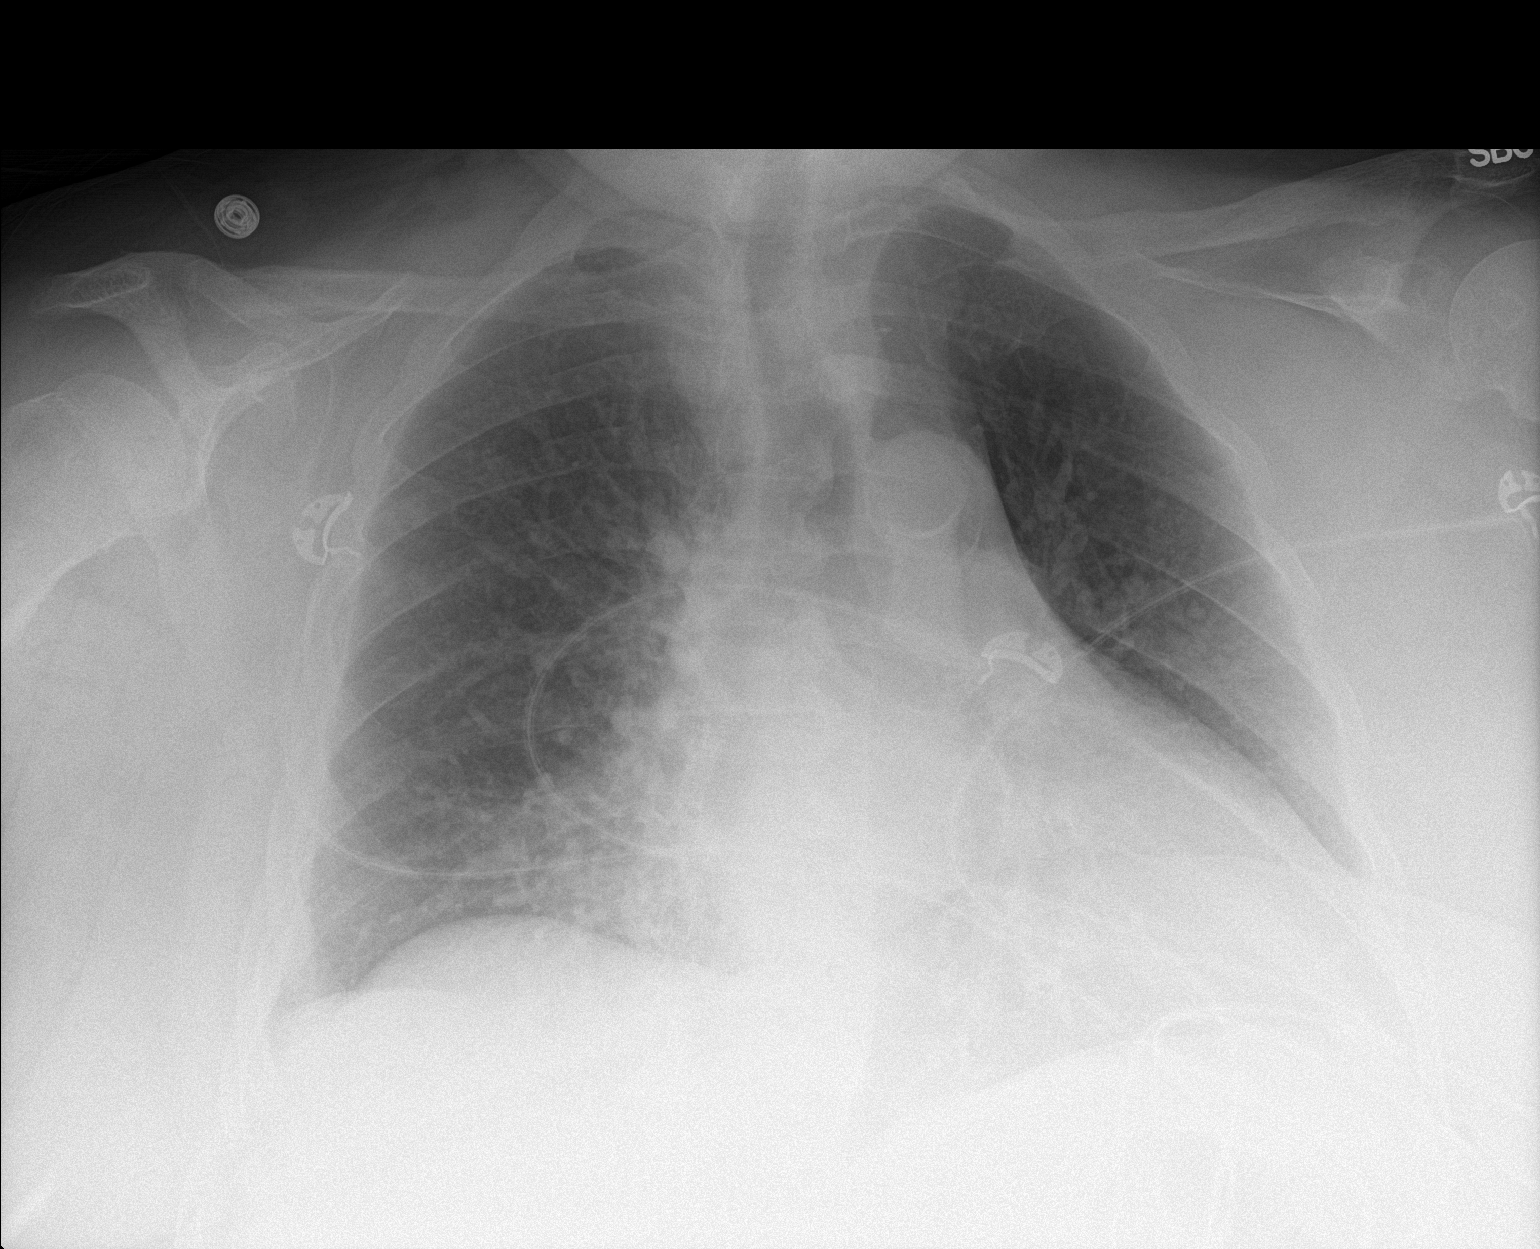

[1 of 1 positions shown; findings below may reference images not displayed]

FINDINGS: Chronic cardiomegaly and aortic atherosclerosis. Question pulmonary
venous hypertension without frank edema. Mild bibasilar atelectasis.
IMPRESSION: Cardiomegaly and aortic atherosclerosis. Question pulmonary venous
hypertension without frank edema. Mild basilar atelectasis.

## 2019-06-04 IMAGING — CR DG CHEST 2V
2 series · 2 of 2 positions shown · non-contrast
Comparison: January 23, 2018 and March 23, 2017

CLINICAL DATA: Cough

EXAM:
CHEST - 2 VIEW

[chest pa]
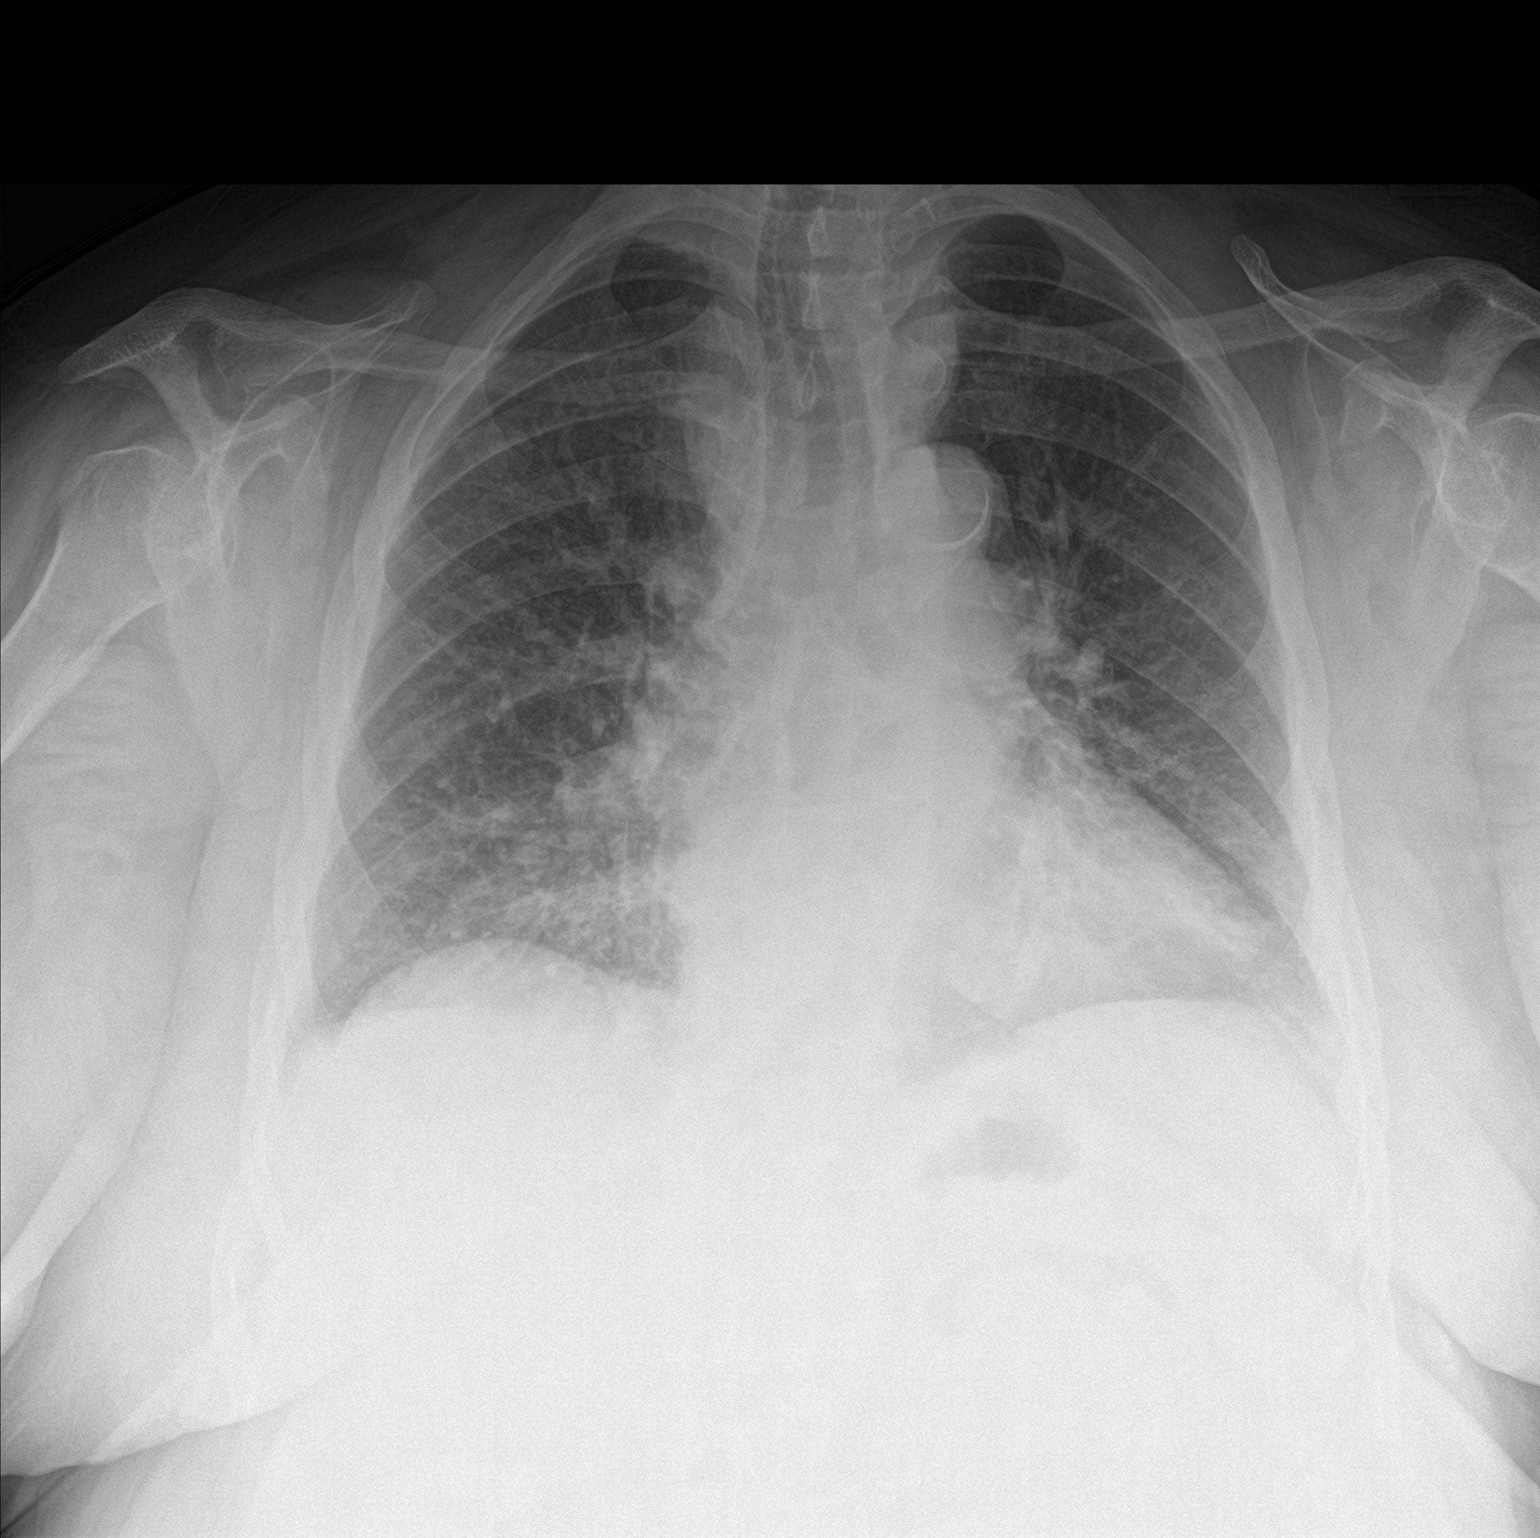

[chest lat]
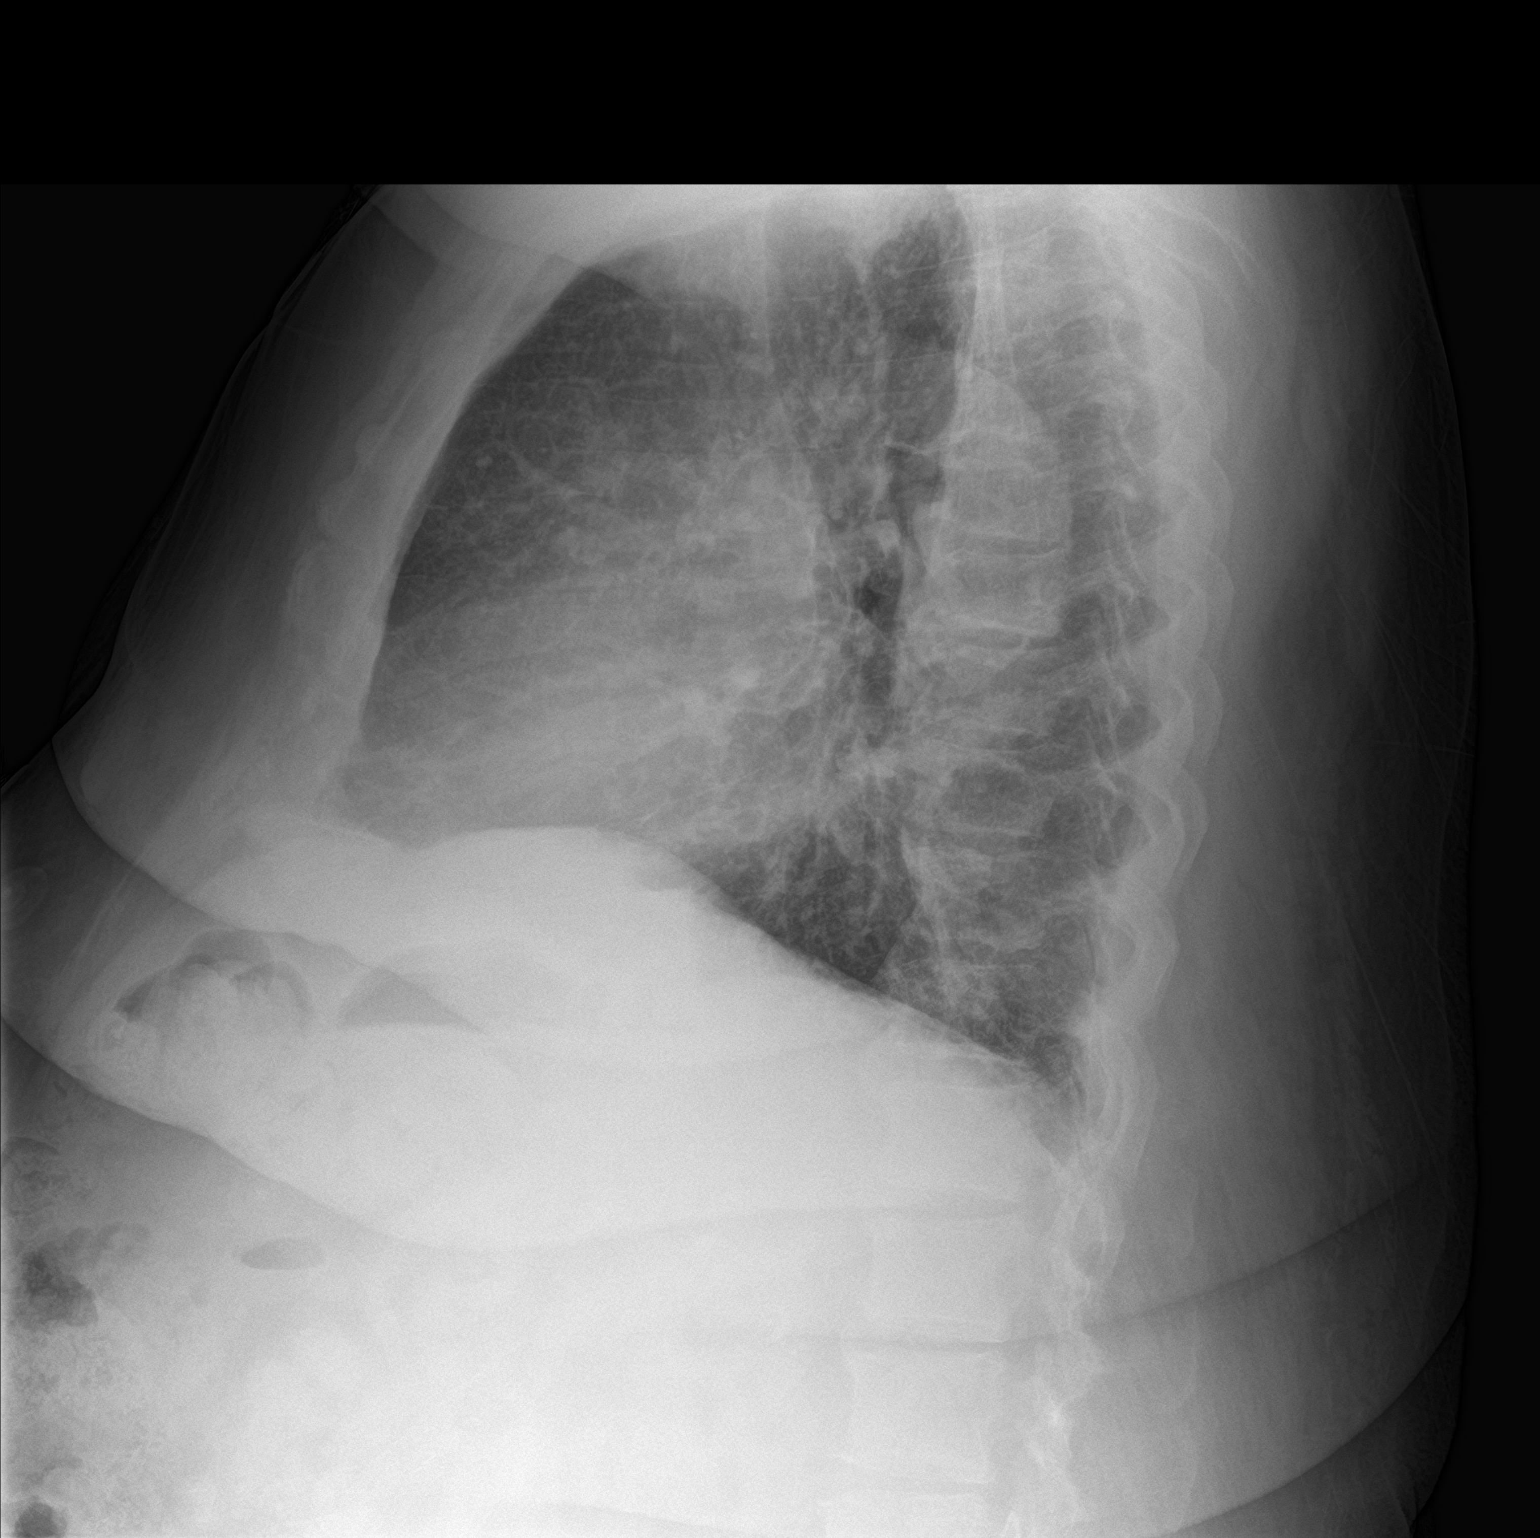

[2 of 2 positions shown; findings below may reference images not displayed]

FINDINGS: There is mild bibasilar interstitial prominence which is chronic and
stable. There is focal opacity overlying the left heart measuring
2.9 x 2.6 cm. There is no frank edema or consolidation. Heart size
and pulmonary vascularity are normal. There is aortic
atherosclerosis. No adenopathy. No evident bone lesions.
IMPRESSION: 2.9 x 2.6 cm opacity overlying the left heart. A nodular lesion in
this area must be of concern. Advise correlation with noncontrast
enhanced chest CT to further assess.

Chronic interstitial thickening in the bases. No frank edema or
consolidation. Stable cardiac silhouette. There is aortic
atherosclerosis.

Aortic Atherosclerosis ([id number]).

## 2019-06-05 IMAGING — CT CT CHEST W/O CM
2 of 3 series · 15 of 36 positions shown, 18 images · non-contrast
Comparison: Chest radiograph yesterday. Chest CT 10/16/2016

CLINICAL DATA: Mediastinal mass, initial workup. Persistent cough.
Questionable lung nodule on radiograph.

EXAM:
CT CHEST WITHOUT CONTRAST
TECHNIQUE: Multidetector CT imaging of the chest was performed following the
standard protocol without IV contrast.

[Series 3: chest w/o 2mm st · axial · non-contrast · 0.71mm/px · z∈[+1066,+1314]mm · 12 of 146 slices shown, 15 images]
[im 11/146  mediastinal]
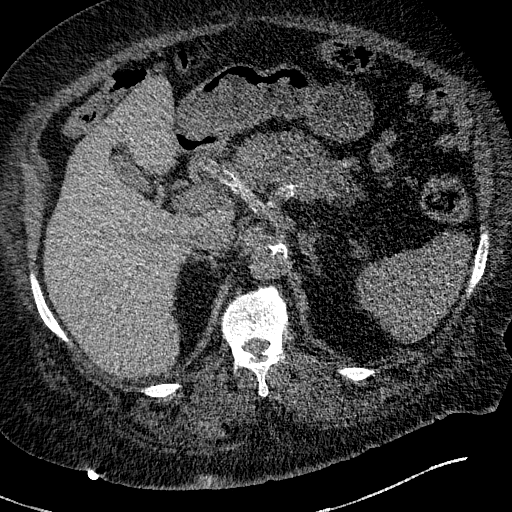
[im 11/146  lung]
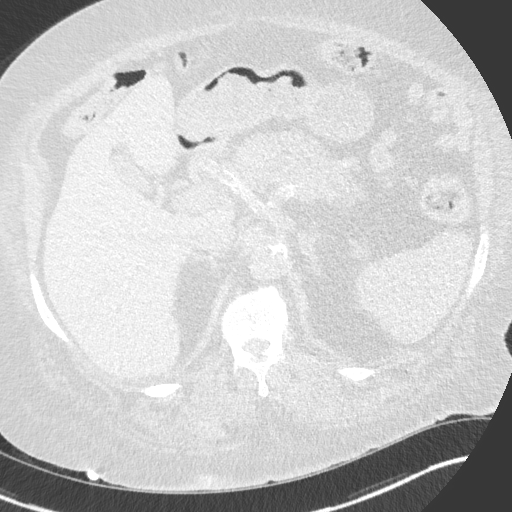
[im 22/146  lung]
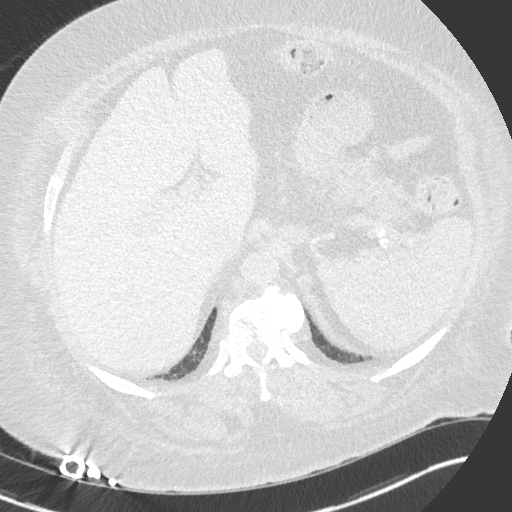
[im 33/146  lung]
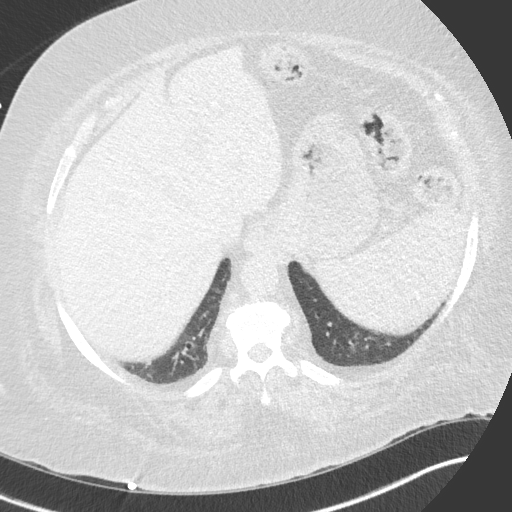
[im 43/146  lung]
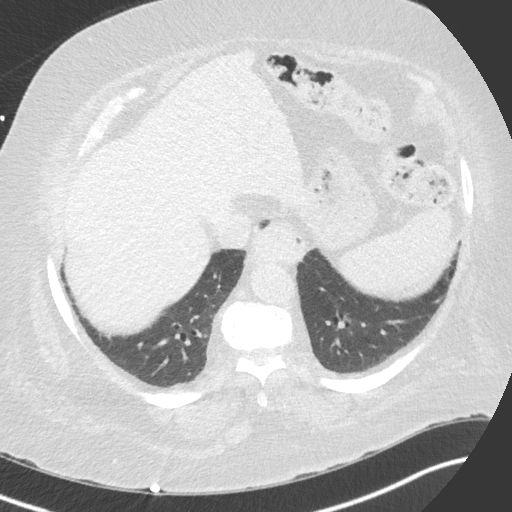
[im 54/146  mediastinal]
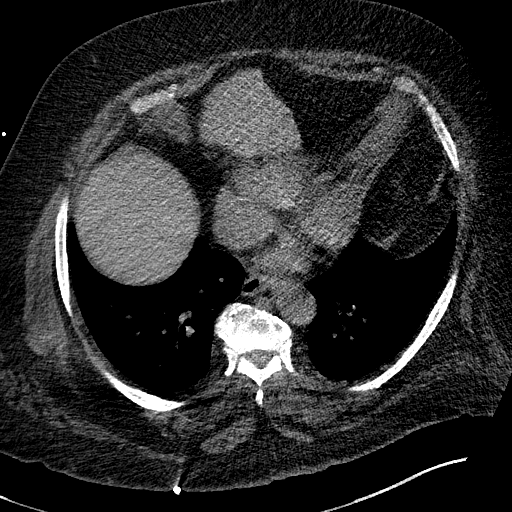
[im 54/146  lung]
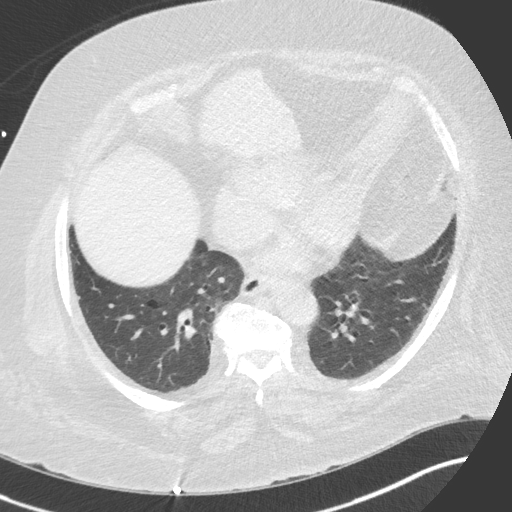
[im 65/146  lung]
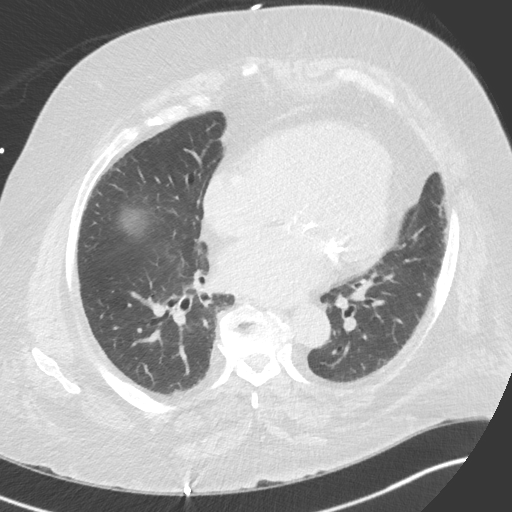
[im 81/146  lung]
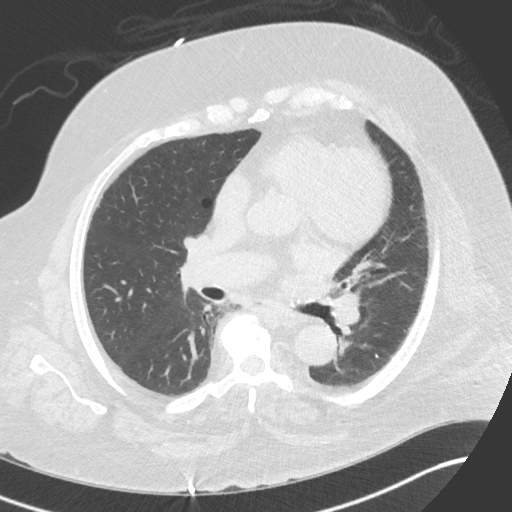
[im 92/146  lung]
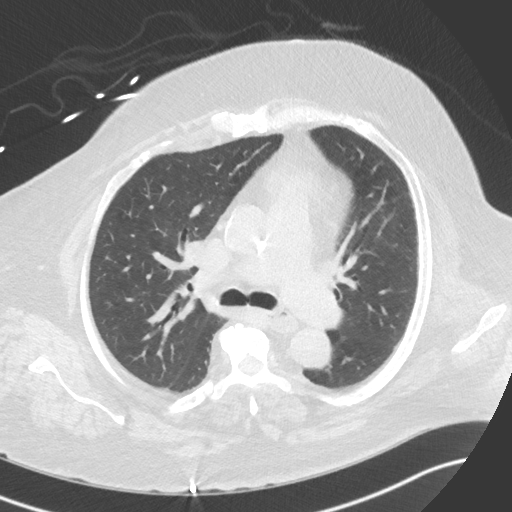
[im 103/146  mediastinal]
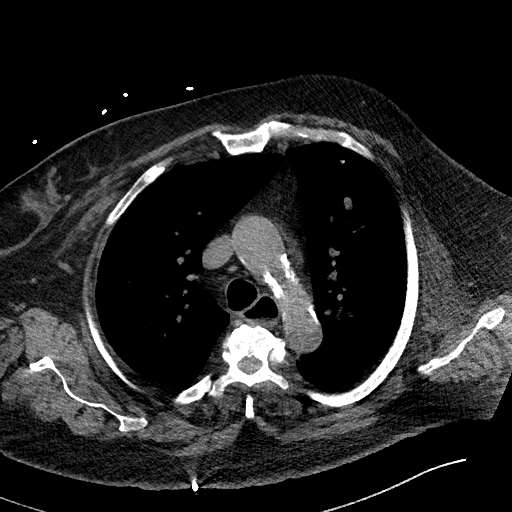
[im 103/146  lung]
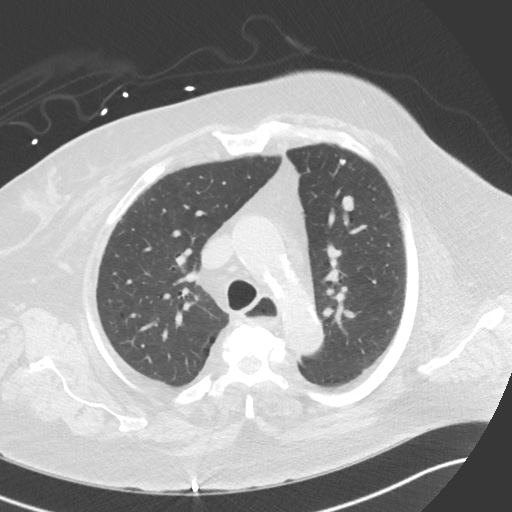
[im 113/146  lung]
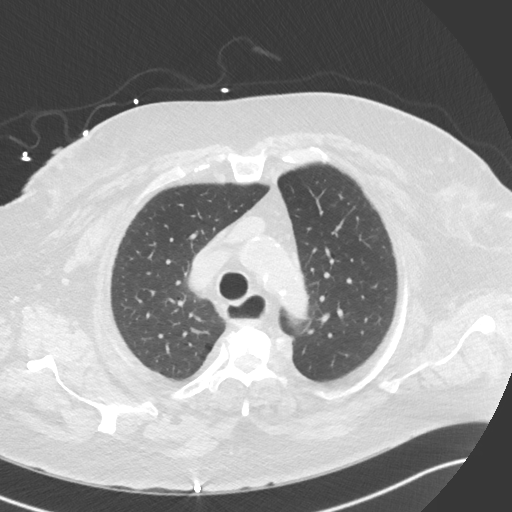
[im 124/146  lung]
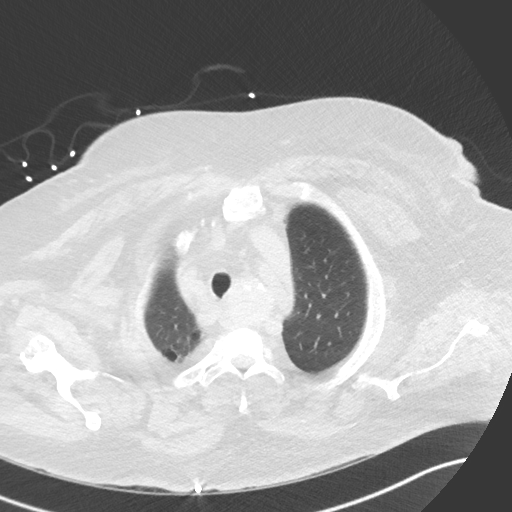
[im 135/146  lung]
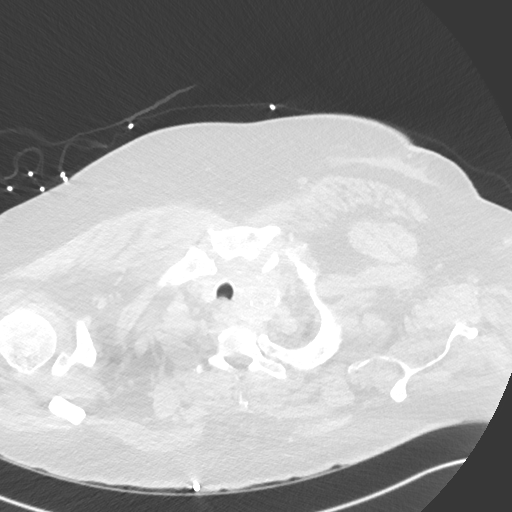

[Series 6: chest w/o 3mm st cor · coronal · non-contrast · 0.59mm/px · 3 of 97 slices shown]
[im 20/97  lung]
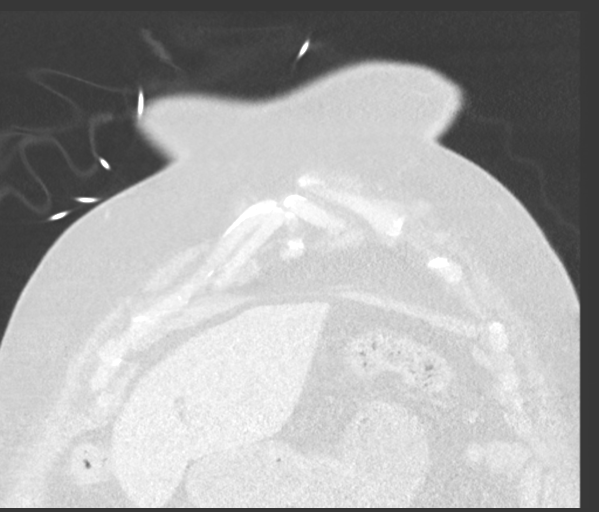
[im 39/97  lung]
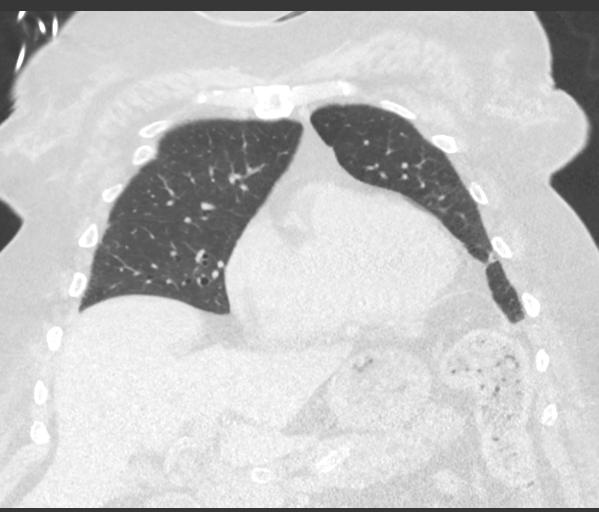
[im 58/97  lung]
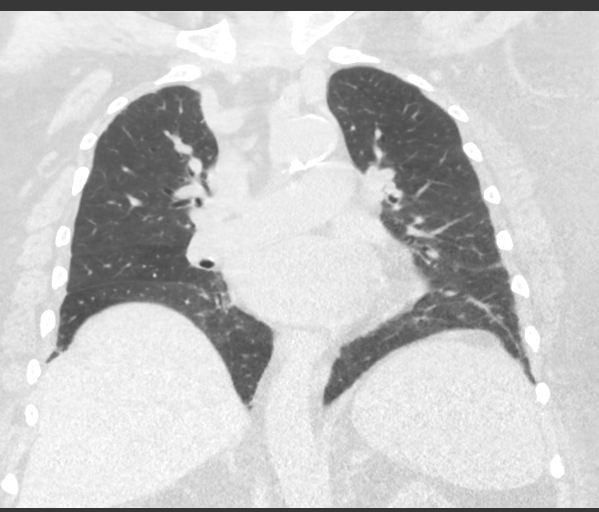

[15 of 36 positions shown; findings below may reference images not displayed]

FINDINGS: Cardiovascular: Aortic atherosclerosis and tortuosity. Branch
vessels are tortuous. Mild multi chamber cardiomegaly. There mitral
annulus calcifications. No pericardial effusion.

Mediastinum/Nodes: Thyromegaly with intrathoracic extension of left
thyroid gland, unchanged from 10/16/2016 chest CT. Otherwise no
mediastinal mass. No mediastinal adenopathy. No evidence of hilar
adenopathy. Patulous esophagus with intraluminal fluid. Small hiatal
hernia. No esophageal wall thickening.

Lungs/Pleura: Mild apical predominant emphysema. No suspicious
pulmonary nodule or mass. Punctate nodule in the right middle lobe
unchanged and considered benign. No focal airspace disease. No
pulmonary edema or pleural effusion. Trachea and mainstem bronchi
are patent.

Upper Abdomen: Hepatosplenomegaly. Atherosclerosis of upper
abdominal vasculature. Left renal atrophy is partially included. No
acute findings.

Musculoskeletal: Degenerative change in the spine and right
shoulder. There are no acute or suspicious osseous abnormalities.
IMPRESSION: 1. No mediastinal or suspicious pulmonary mass. Radiographic
findings likely secondary to mitral annulus calcifications or small
hiatal hernia.
2. No acute chest finding.
3. Left thyromegaly with intrathoracic extension, unchanged from
prior exam.
4. Aortic Atherosclerosis ([id number].M) and Emphysema ([id number]).

## 2019-10-25 IMAGING — DX CHEST - 2 VIEW
2 series · 2 of 2 positions shown · non-contrast
Comparison: CT chest 01/26/2018. PA and lateral chest 01/25/2018
and 04/02/2017.

CLINICAL DATA: Shortness of breath for 1 day.

EXAM:
CHEST - 2 VIEW

[w chest pa]
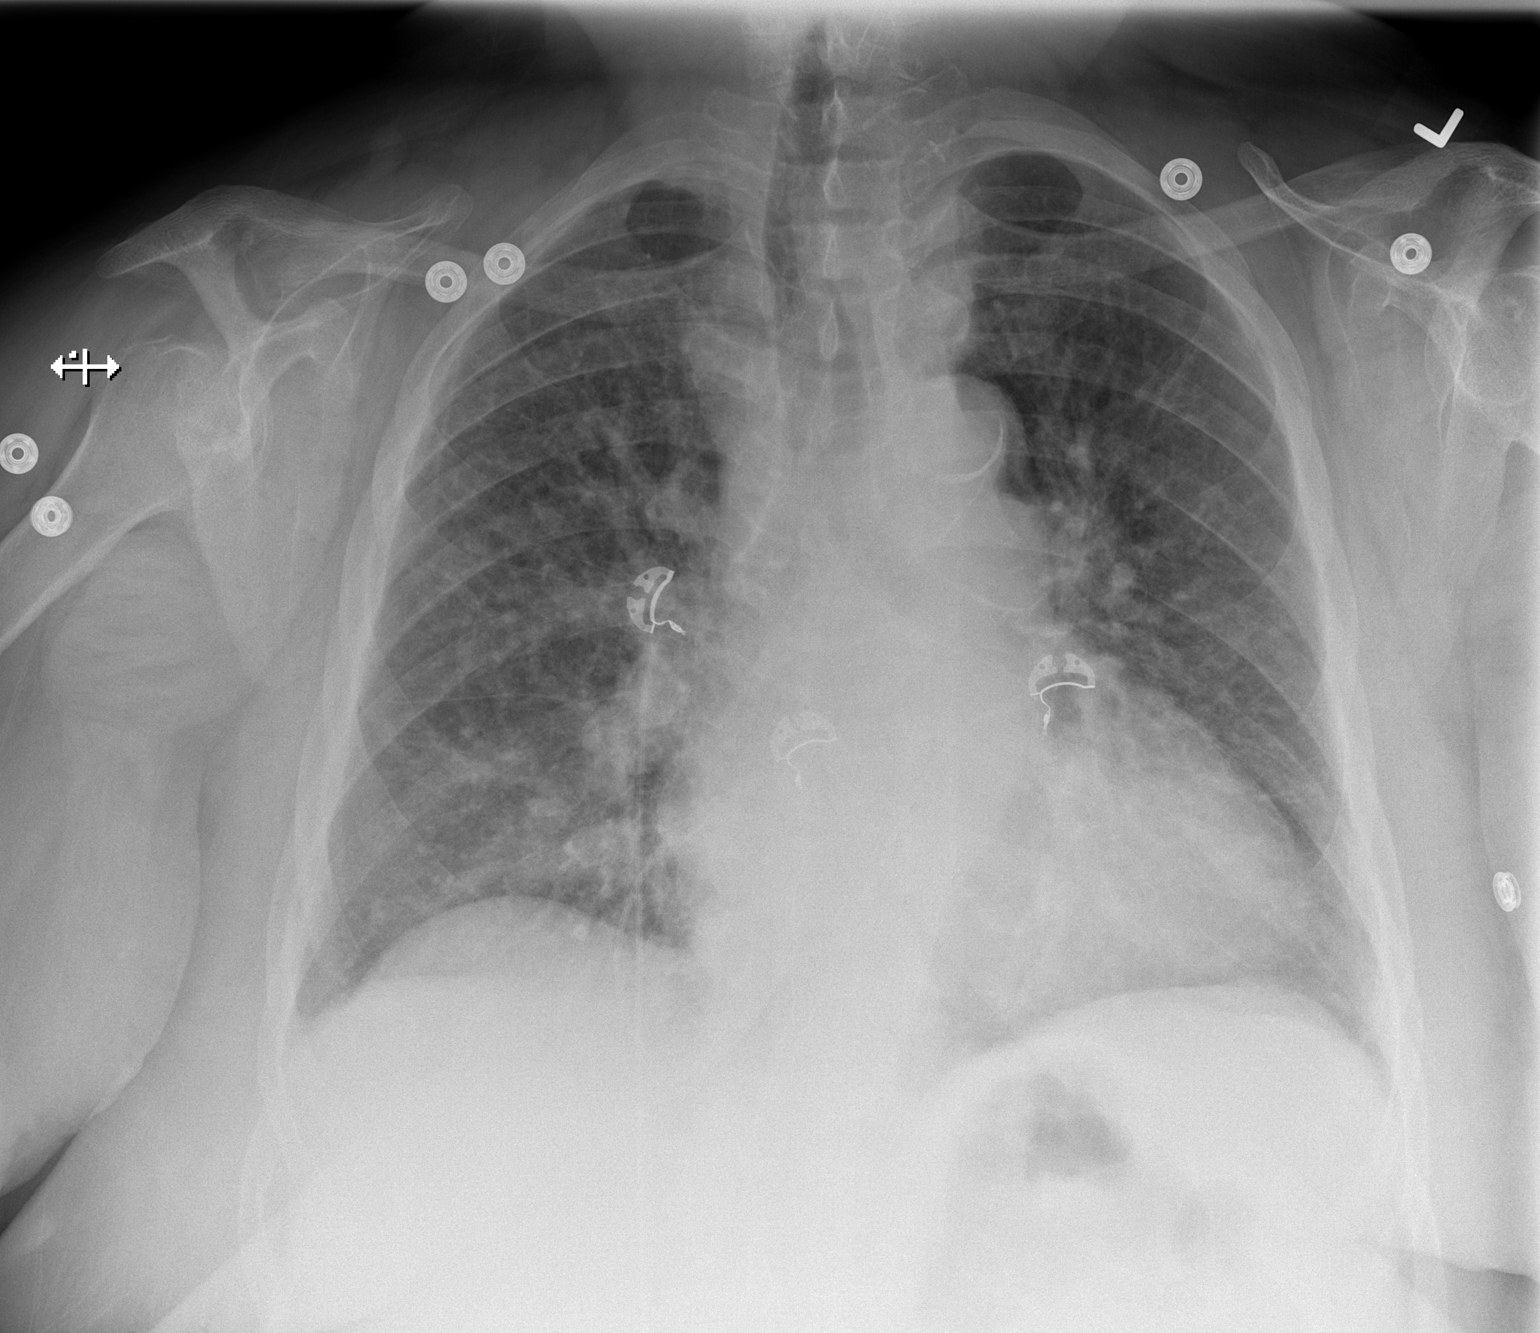

[w chest lat]
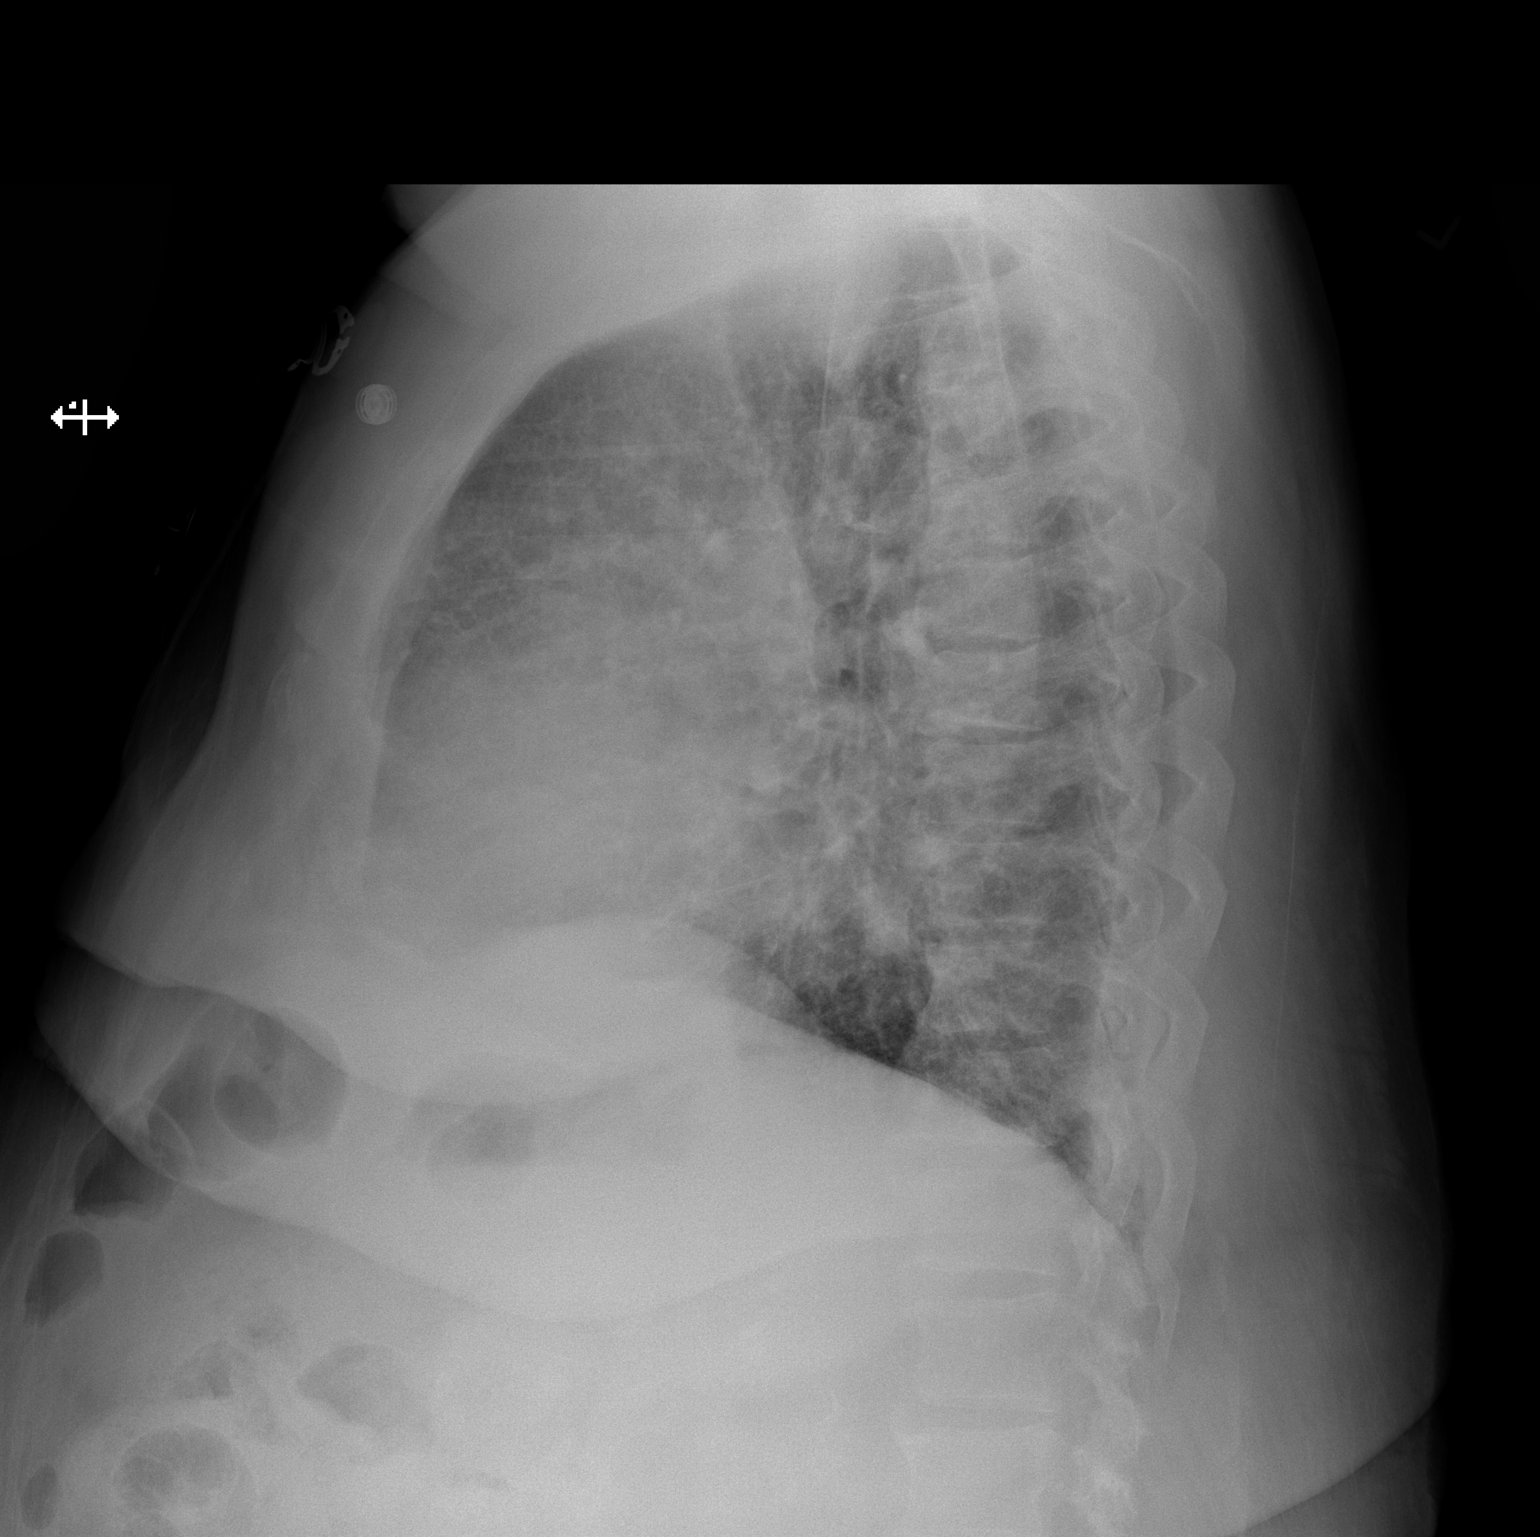

[2 of 2 positions shown; findings below may reference images not displayed]

FINDINGS: There is cardiomegaly and interstitial edema. No pleural fluid.
Atherosclerosis noted. No acute or focal bony abnormality.
IMPRESSION: Cardiomegaly and interstitial edema.

Atherosclerosis.

## 2019-10-28 IMAGING — DX PORTABLE CHEST - 1 VIEW
1 series · 1 of 1 positions shown · non-contrast
Comparison: PA and lateral chest 06/17/2018 and 01/25/2018.

CLINICAL DATA: Respiratory distress.

EXAM:
PORTABLE CHEST 1 VIEW

[chest ap]
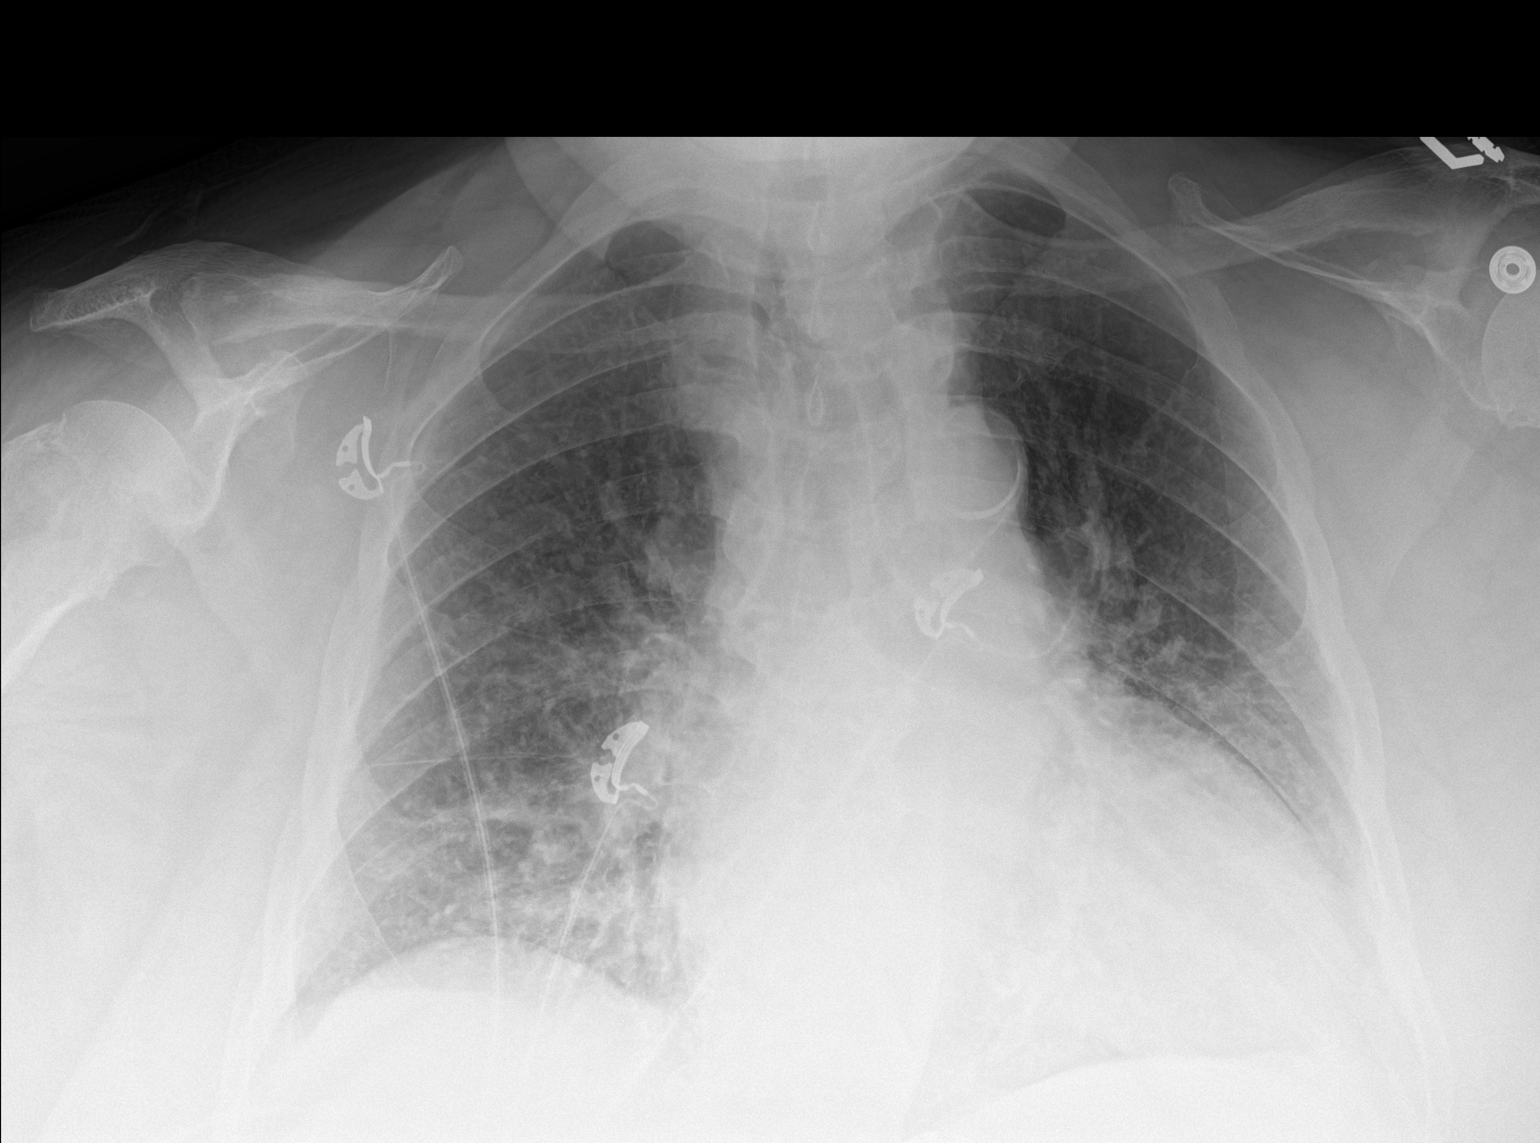

[1 of 1 positions shown; findings below may reference images not displayed]

FINDINGS: Cardiomegaly again seen. Interstitial edema seen on the most recent
examination appears improved. No consolidative process, pneumothorax
or effusion. Aortic atherosclerosis noted. No acute or focal bony
abnormality.
IMPRESSION: Interstitial edema appears improved compared to the most recent
exam.

Cardiomegaly.

Atherosclerosis.

## 2020-02-12 IMAGING — DX DG CHEST 1V PORT
1 series · 1 of 1 positions shown · non-contrast
Comparison: 06/20/2018

CLINICAL DATA: Shortness of breath, dialysis

EXAM:
PORTABLE CHEST 1 VIEW

[chest ap]
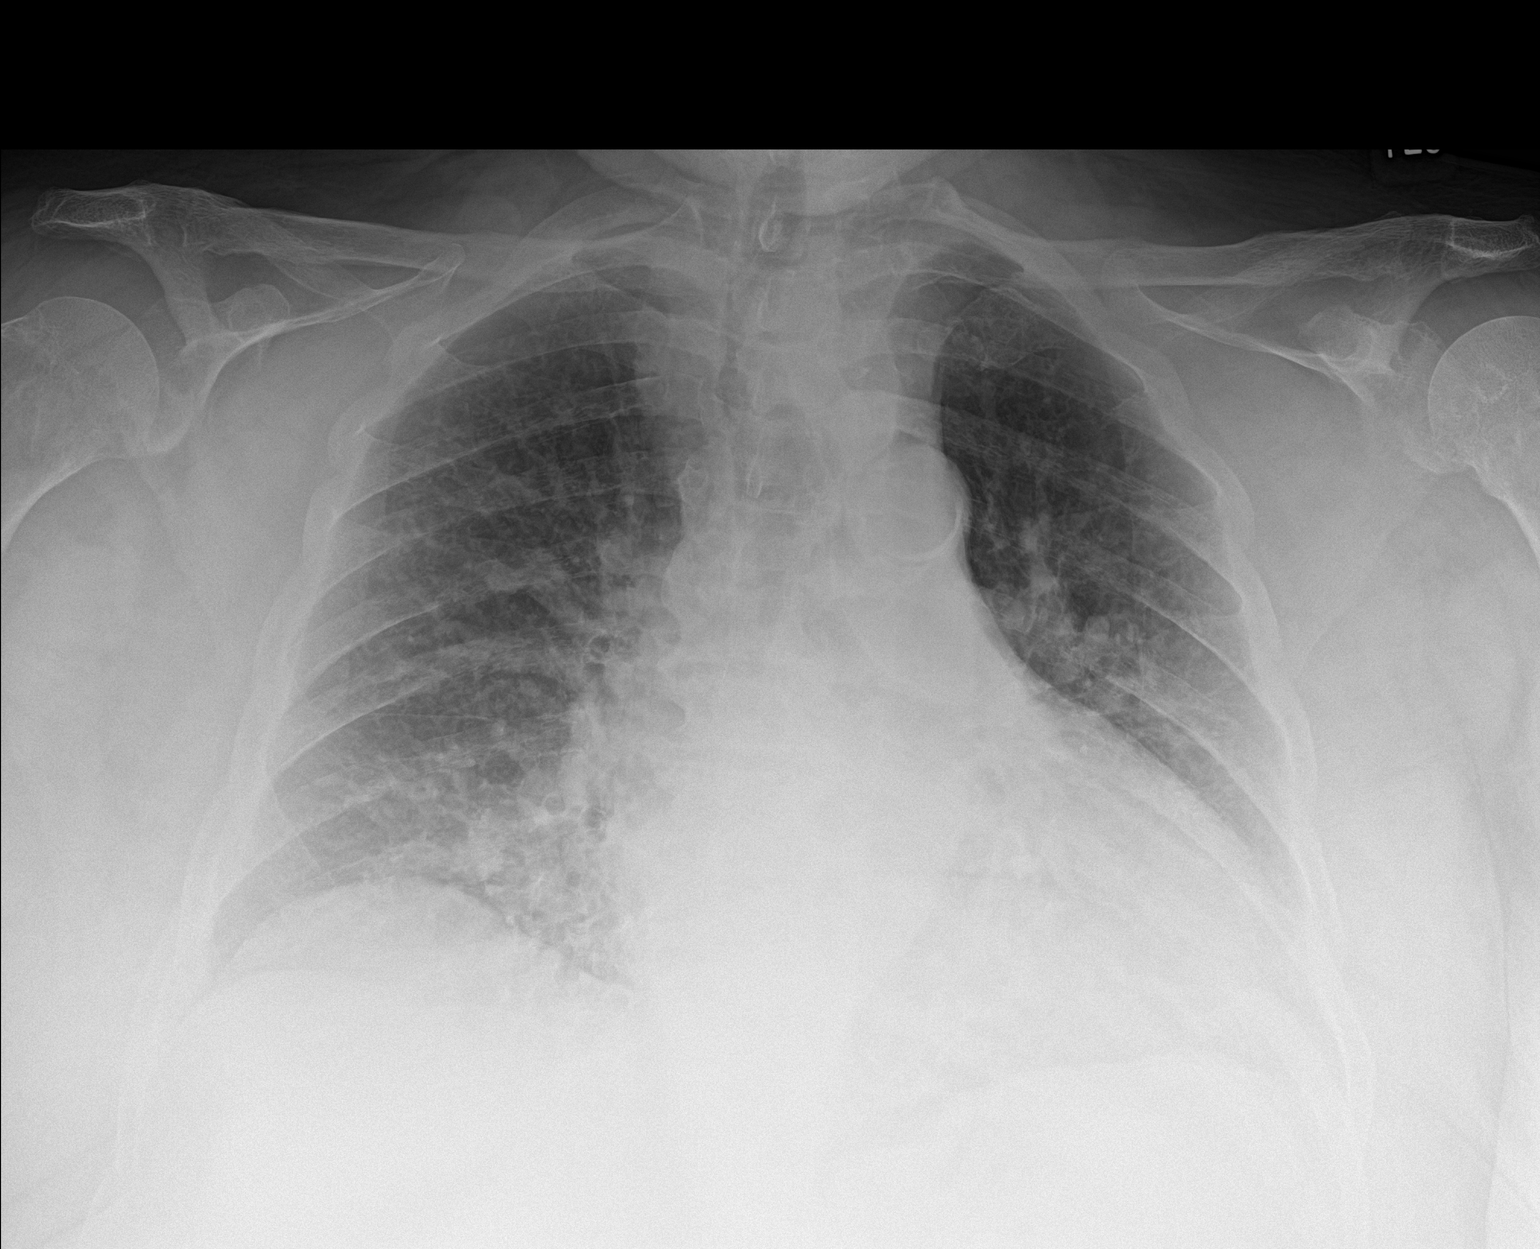

[1 of 1 positions shown; findings below may reference images not displayed]

FINDINGS: Cardiomegaly. Mild, diffuse bilateral interstitial pulmonary
opacity. The visualized skeletal structures are unremarkable.
IMPRESSION: Cardiomegaly with mild, diffuse bilateral interstitial pulmonary
opacity, likely edema. No focal airspace opacity.

## 2020-02-12 IMAGING — MR MR LUMBAR SPINE W/O CM
4 of 6 series · 19 of 48 positions shown · non-contrast
Comparison: Radiography same day

CLINICAL DATA: Low back pain not responding to conservative
therapy. Dialysis patient.

EXAM:
MRI LUMBAR SPINE WITHOUT CONTRAST
TECHNIQUE: Multiplanar, multisequence MR imaging of the lumbar spine was
performed. No intravenous contrast was administered.

[Series 3: T1 · sagittal · 4.0mm · 0.59mm/px · 3 of 15 slices shown (1 of 2)]
[im 1/15]
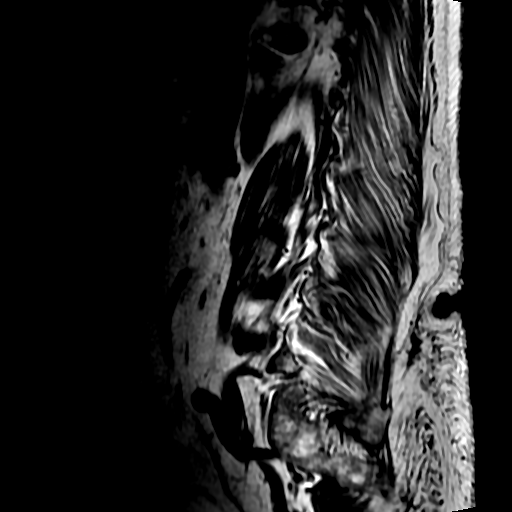
[im 10/15]
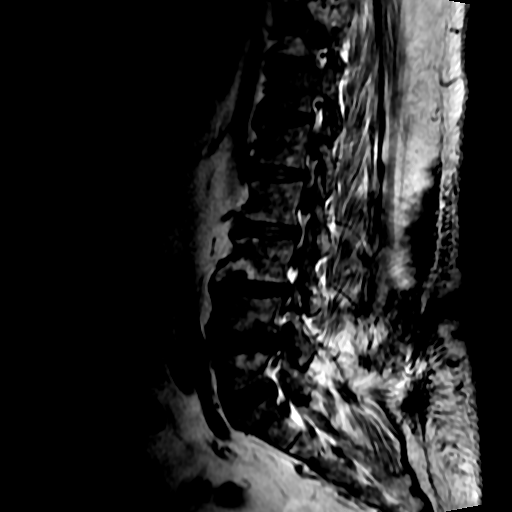
[im 15/15]
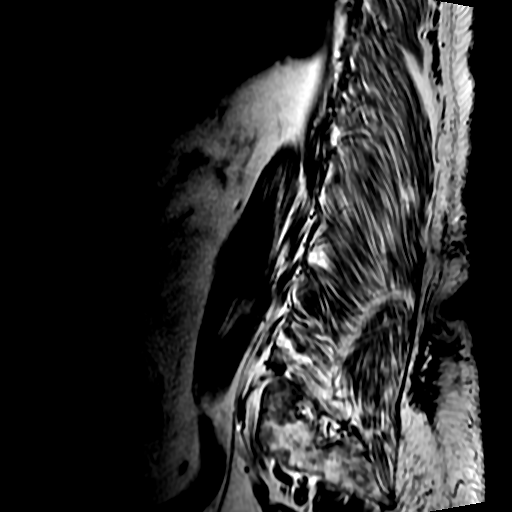

[Series 4: T2 · sagittal · 4.0mm · 0.59mm/px · 4 of 15 slices shown (1 of 2)]
[im 1/15]
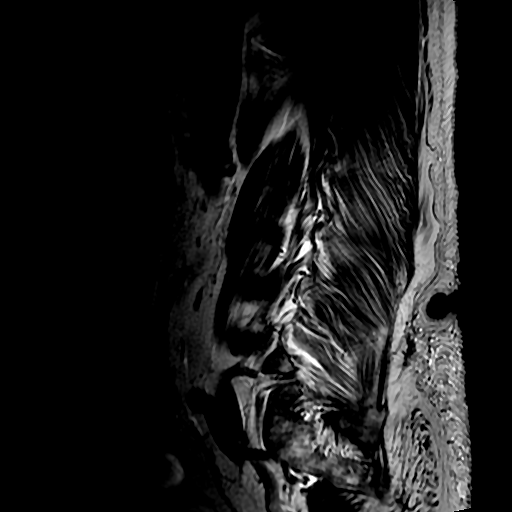
[im 5/15]
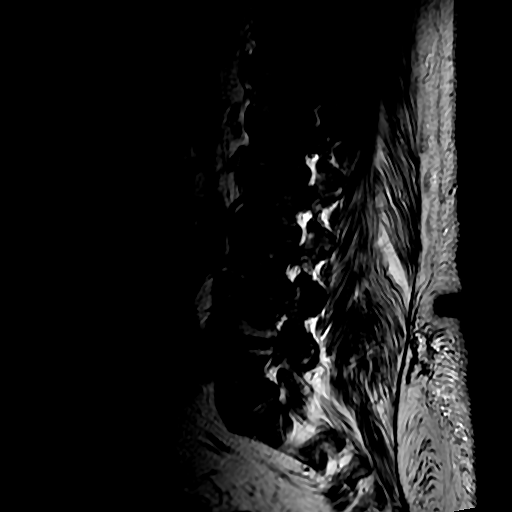
[im 10/15]
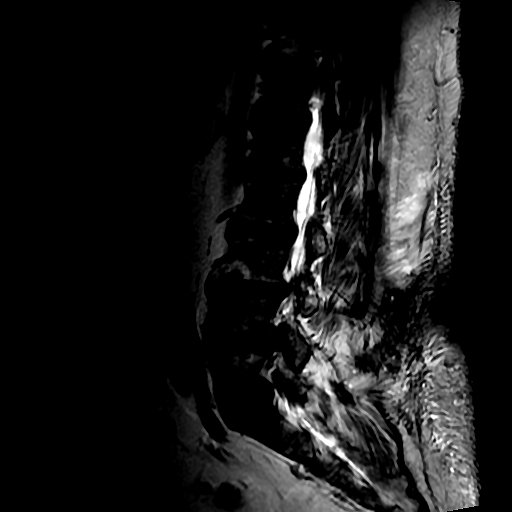
[im 15/15]
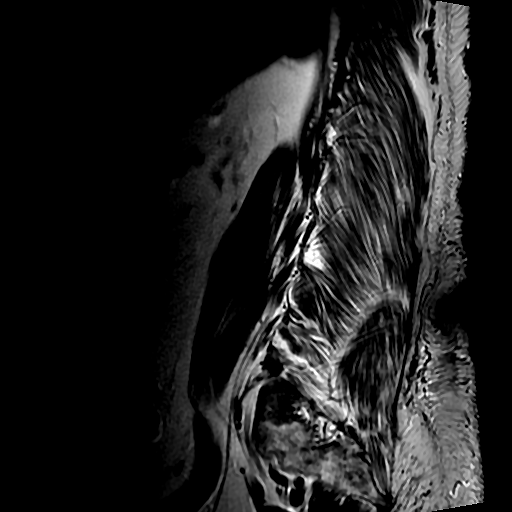

[Series 6: T2 · axial · 4.0mm · 0.39mm/px · z∈[-163,+91]mm · 9 of 49 slices shown (2 of 2)]
[im 1/49]
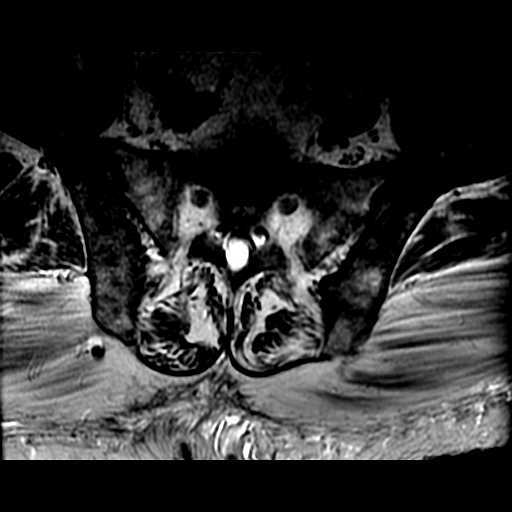
[im 7/49]
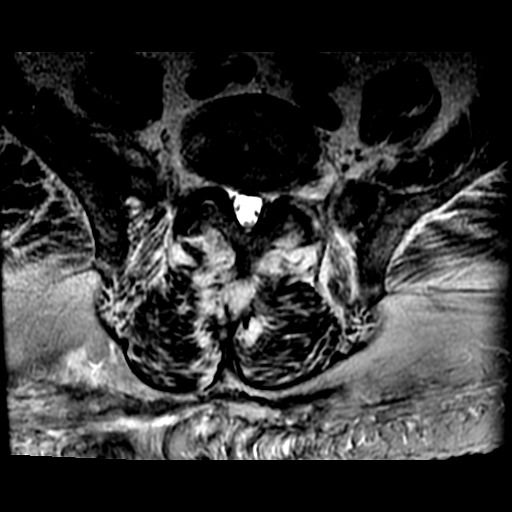
[im 14/49]
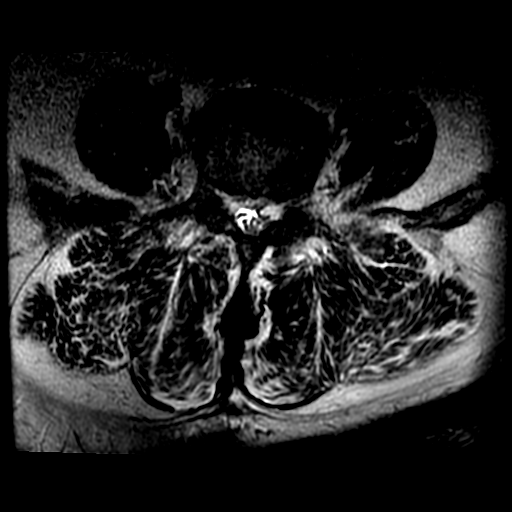
[im 21/49]
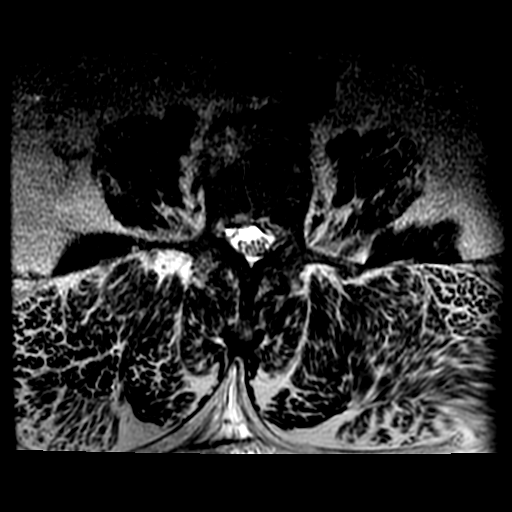
[im 25/49]
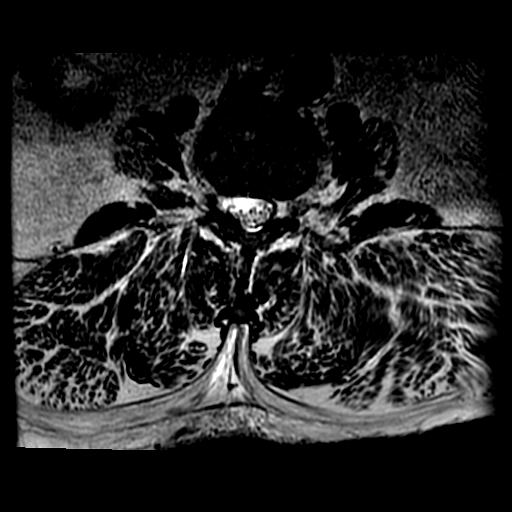
[im 28/49]
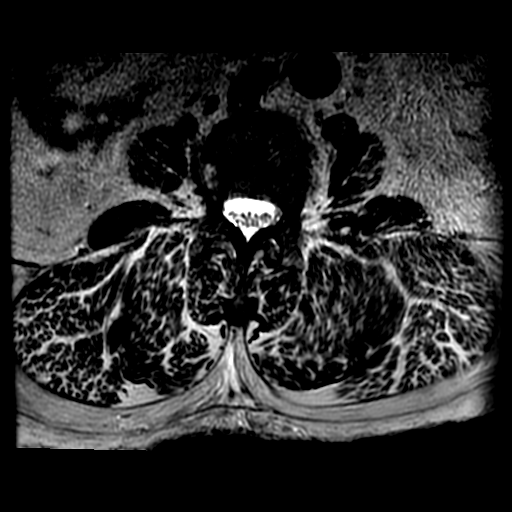
[im 35/49]
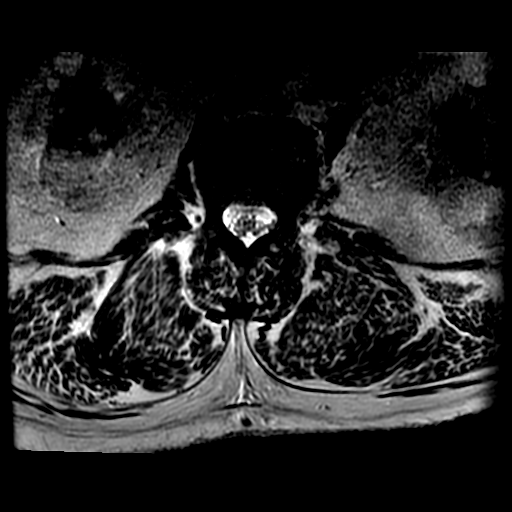
[im 42/49]
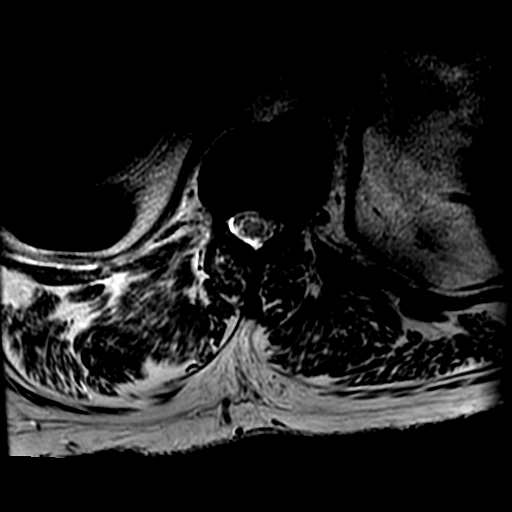
[im 49/49]
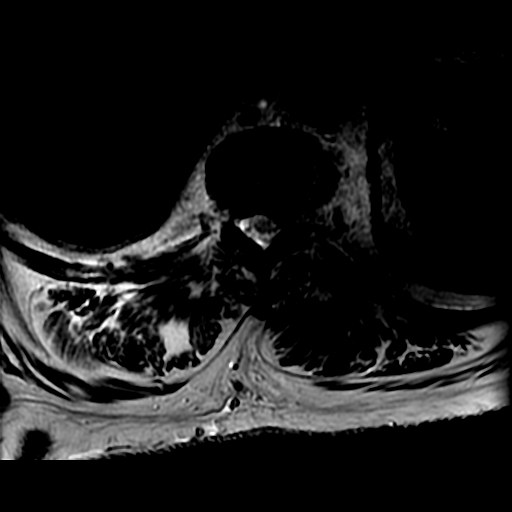

[Series 7: T1 · axial · 4.0mm · 0.39mm/px · z∈[-133,+56]mm · 3 of 49 slices shown (2 of 2)]
[im 7/49]
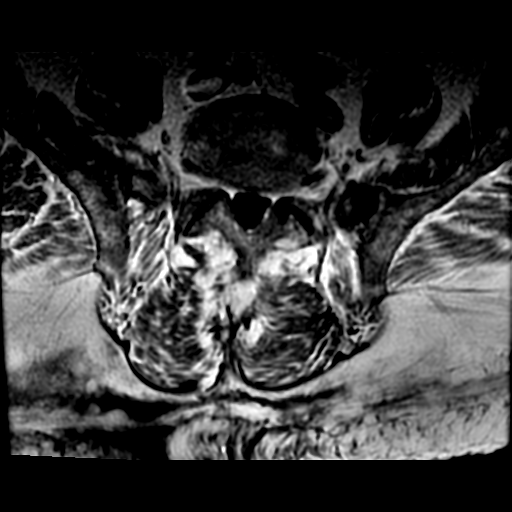
[im 25/49]
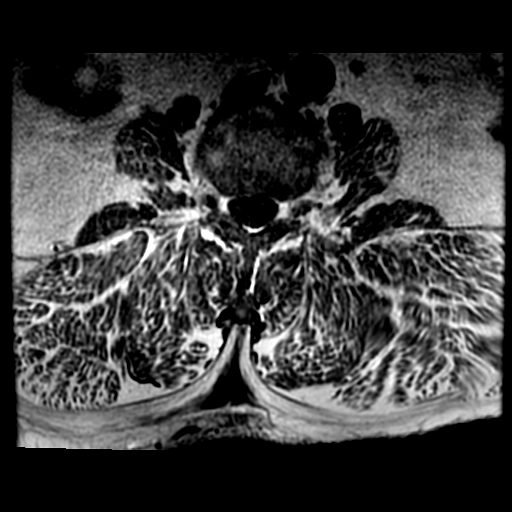
[im 42/49]
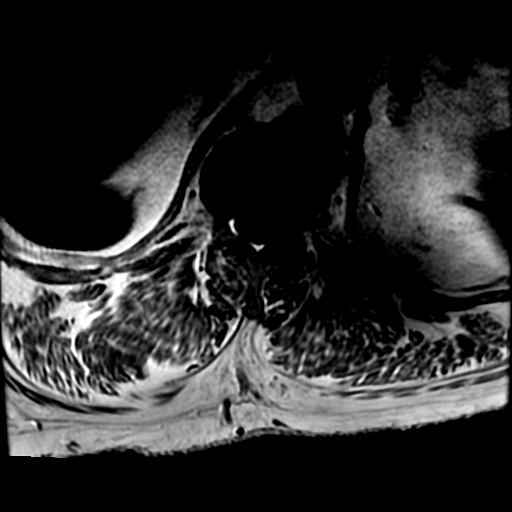

[19 of 48 positions shown; findings below may reference images not displayed]

FINDINGS: Segmentation:  5 lumbar type vertebral bodies.

Alignment:  Straightening of the normal lumbar lordosis.

Vertebrae: No fracture or malignant-appearing bone lesion. Benign
appearing fatty changes within the lower thoracic and lumbar spine.
Discogenic endplate edema at L3-4 and L4-5 that could contribute to
back pain.

Conus medullaris and cauda equina: Conus extends to the T12-L1
level. Conus and cauda equina appear normal.

Paraspinal and other soft tissues: Fatty atrophy of the paraspinal
musculature.

Disc levels:

Noncompressive disc bulges at L2-3 and above.

L3-4: Moderate bulging of the disc. Mild facet and ligamentous
prominence. Mild stenosis of both lateral recesses without definite
neural compression. Some discogenic endplate marrow changes which
could be associated with back pain.

L4-5: Disc degeneration with broad-based herniation. Facet and
ligamentous hypertrophy. Stenosis of both lateral recesses that
could compress either or both L5 nerves. Discogenic endplate changes
that could be associated with back pain.

L5-S1: Mild bulging of the disc. Mild facet arthritis. No
compressive stenosis.
IMPRESSION: L4-5: Multifactorial spinal stenosis that could cause neural
compression on either or both sides. Broad-based disc herniation in
combination with facet and ligamentous hypertrophy.

L3-4: Mild bilateral lateral recess stenosis because of bulging of
the disc and mild facet hypertrophy. No definite neural compression.

Discogenic endplate marrow changes at L3-4 and L4-5 which could be
associated with back pain.

## 2020-02-12 IMAGING — CR DG LUMBAR SPINE COMPLETE 4+V
5 series · 5 of 5 positions shown · non-contrast
Comparison: Abdominal CT 04/11/2015

CLINICAL DATA: Low back pain since yesterday morning.

EXAM:
LUMBAR SPINE - COMPLETE 4+ VIEW

[l-spine obl (1 of 2)]
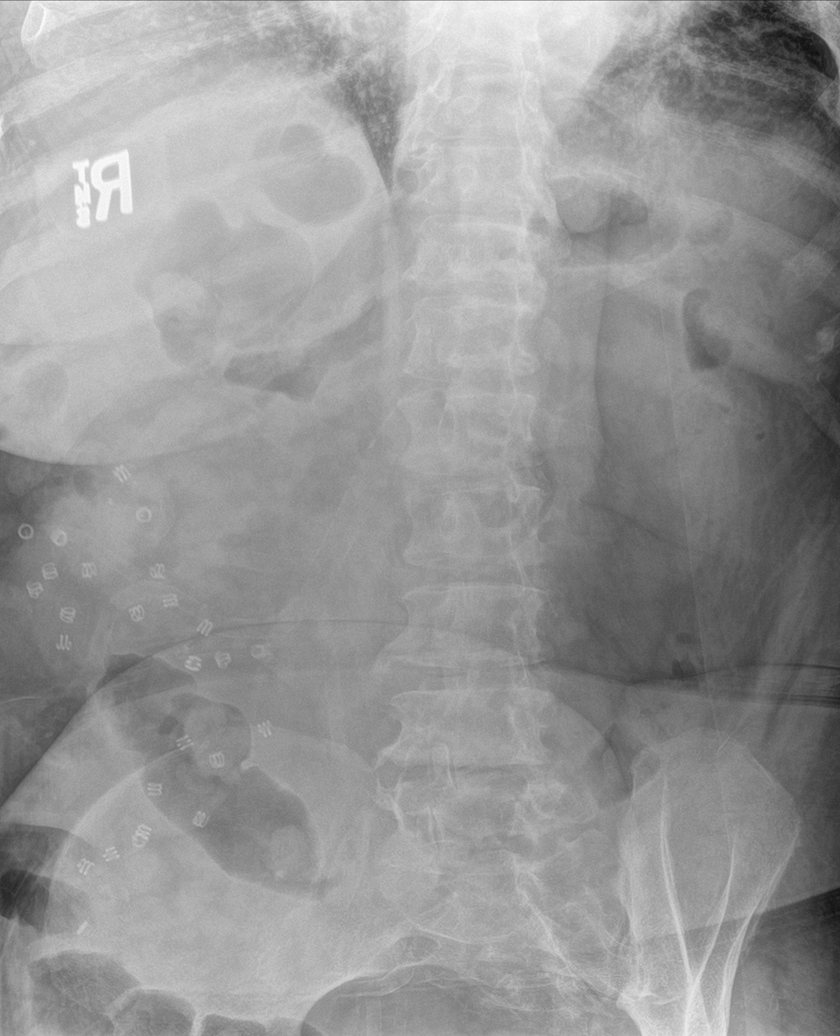

[l-spine lat]
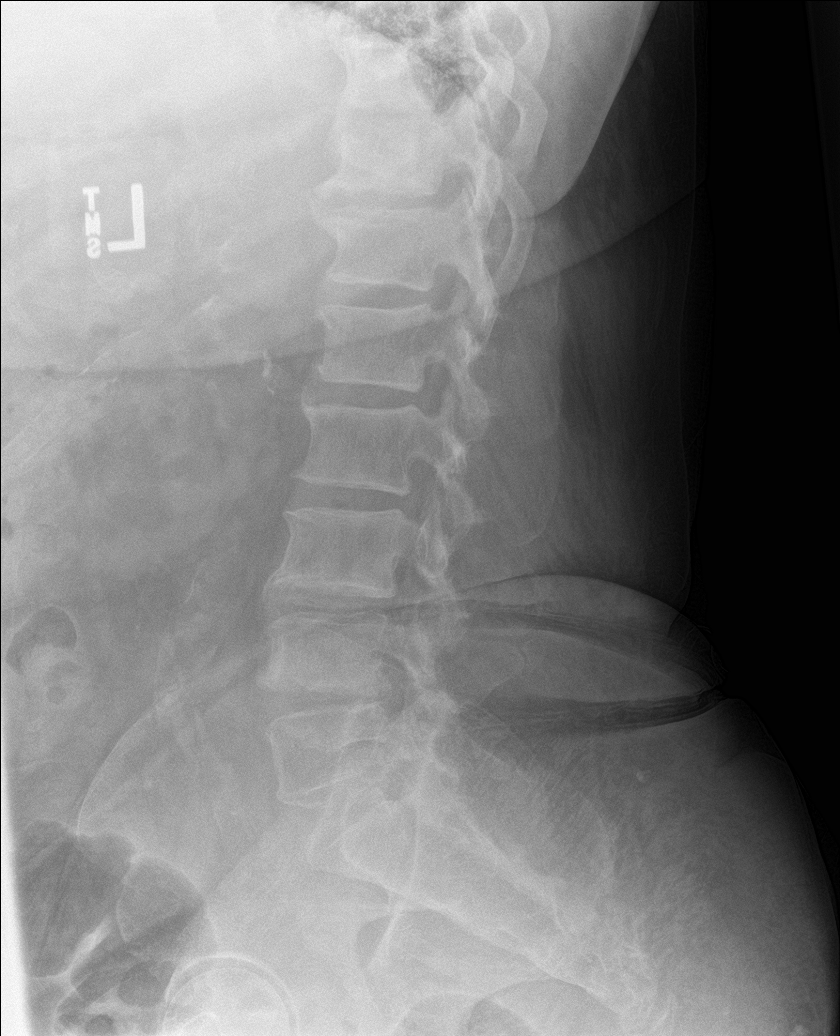

[l-spine spot]
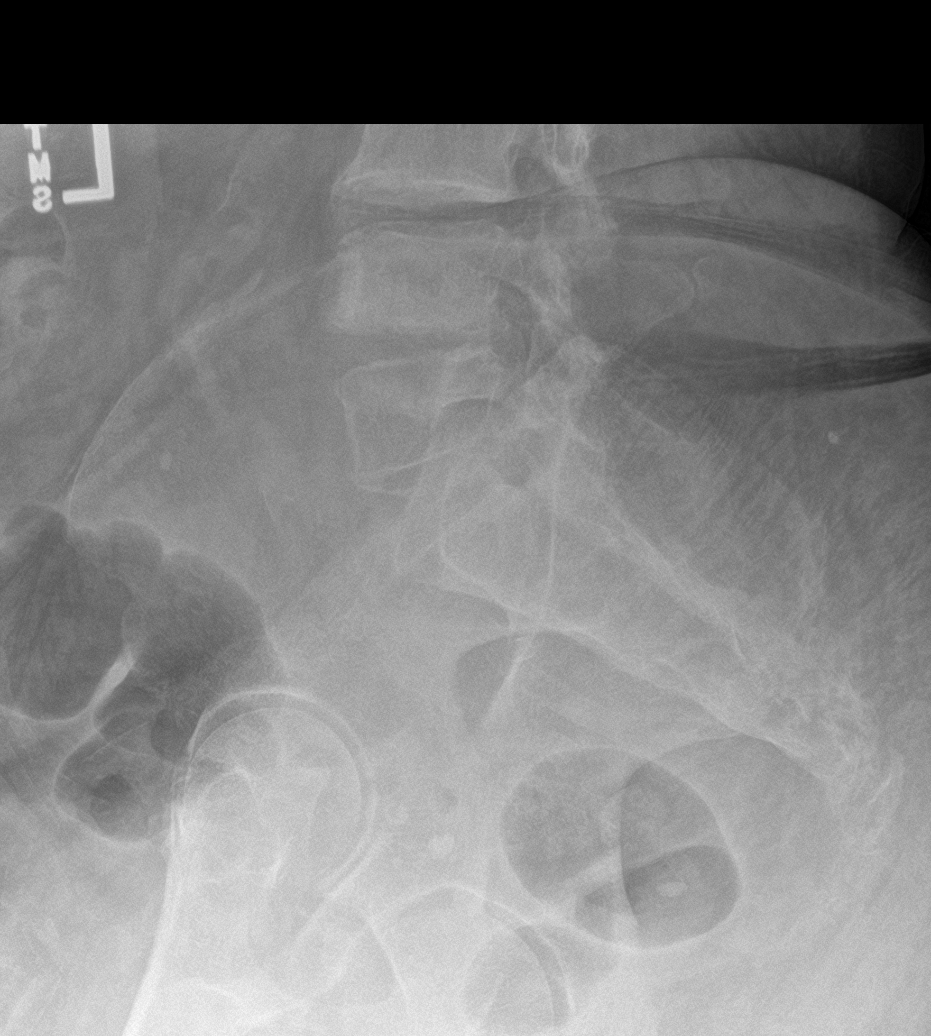

[l-spine ap]
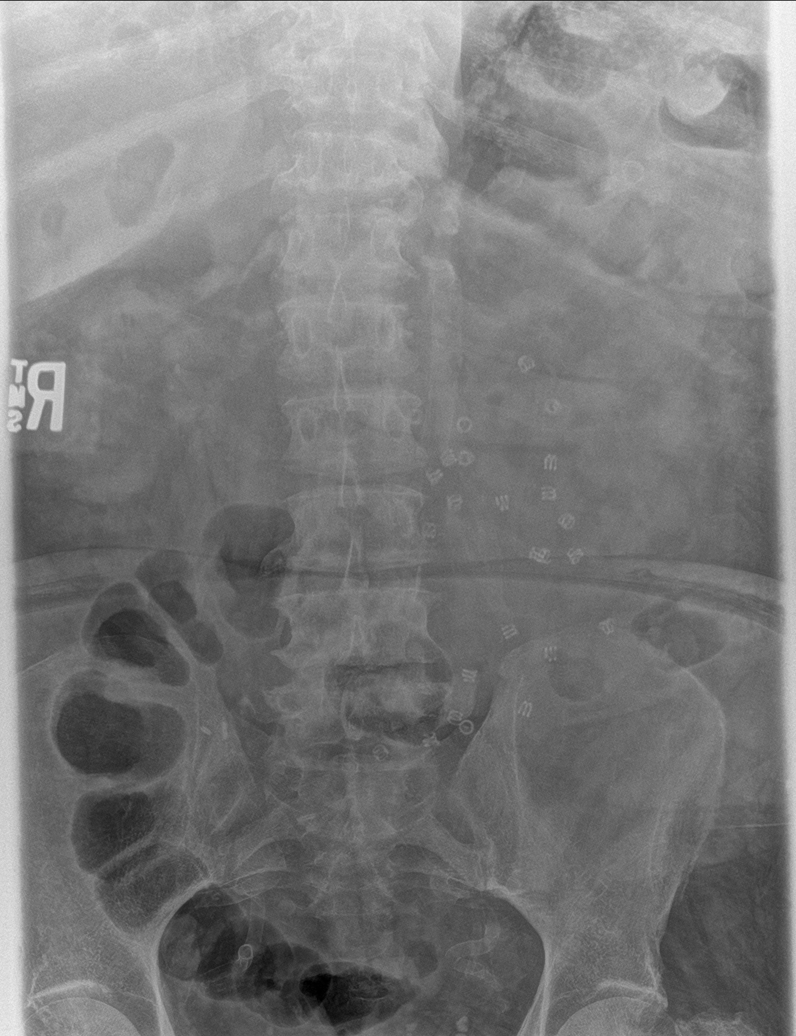

[l-spine obl (2 of 2)]
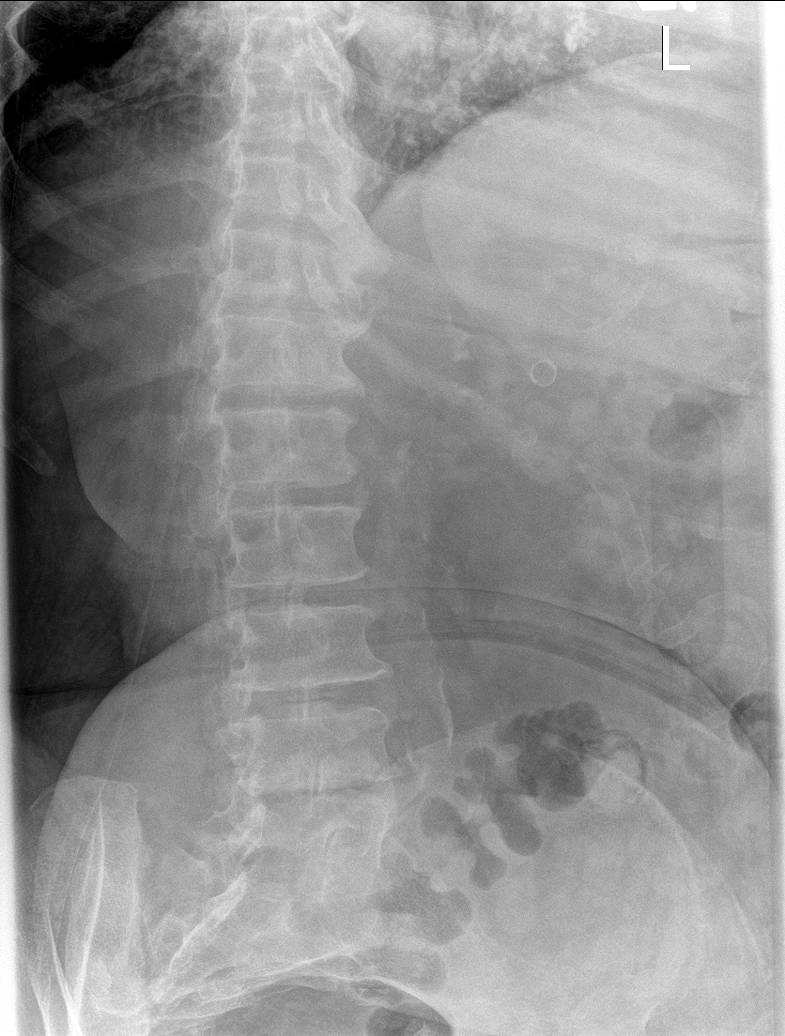

[5 of 5 positions shown; findings below may reference images not displayed]

FINDINGS: Generalized degenerative endplate spurring with disc narrowing
mainly at L3-4 and L4-5. Endplate irregularity is chronic and
degenerative when correlated with 1335 CT. Osteopenic appearance.
Multifocal atherosclerotic calcification.
IMPRESSION: 1. No acute finding.
2. Disc degeneration, especially at L3-4 and L4-5, with similar
appearance to 1335 CT.

## 2020-05-09 IMAGING — DX DG CHEST 1V PORT
1 series · 1 of 1 positions shown · non-contrast
Comparison: 10/05/2018

CLINICAL DATA: Cough

EXAM:
PORTABLE CHEST 1 VIEW

[chest]
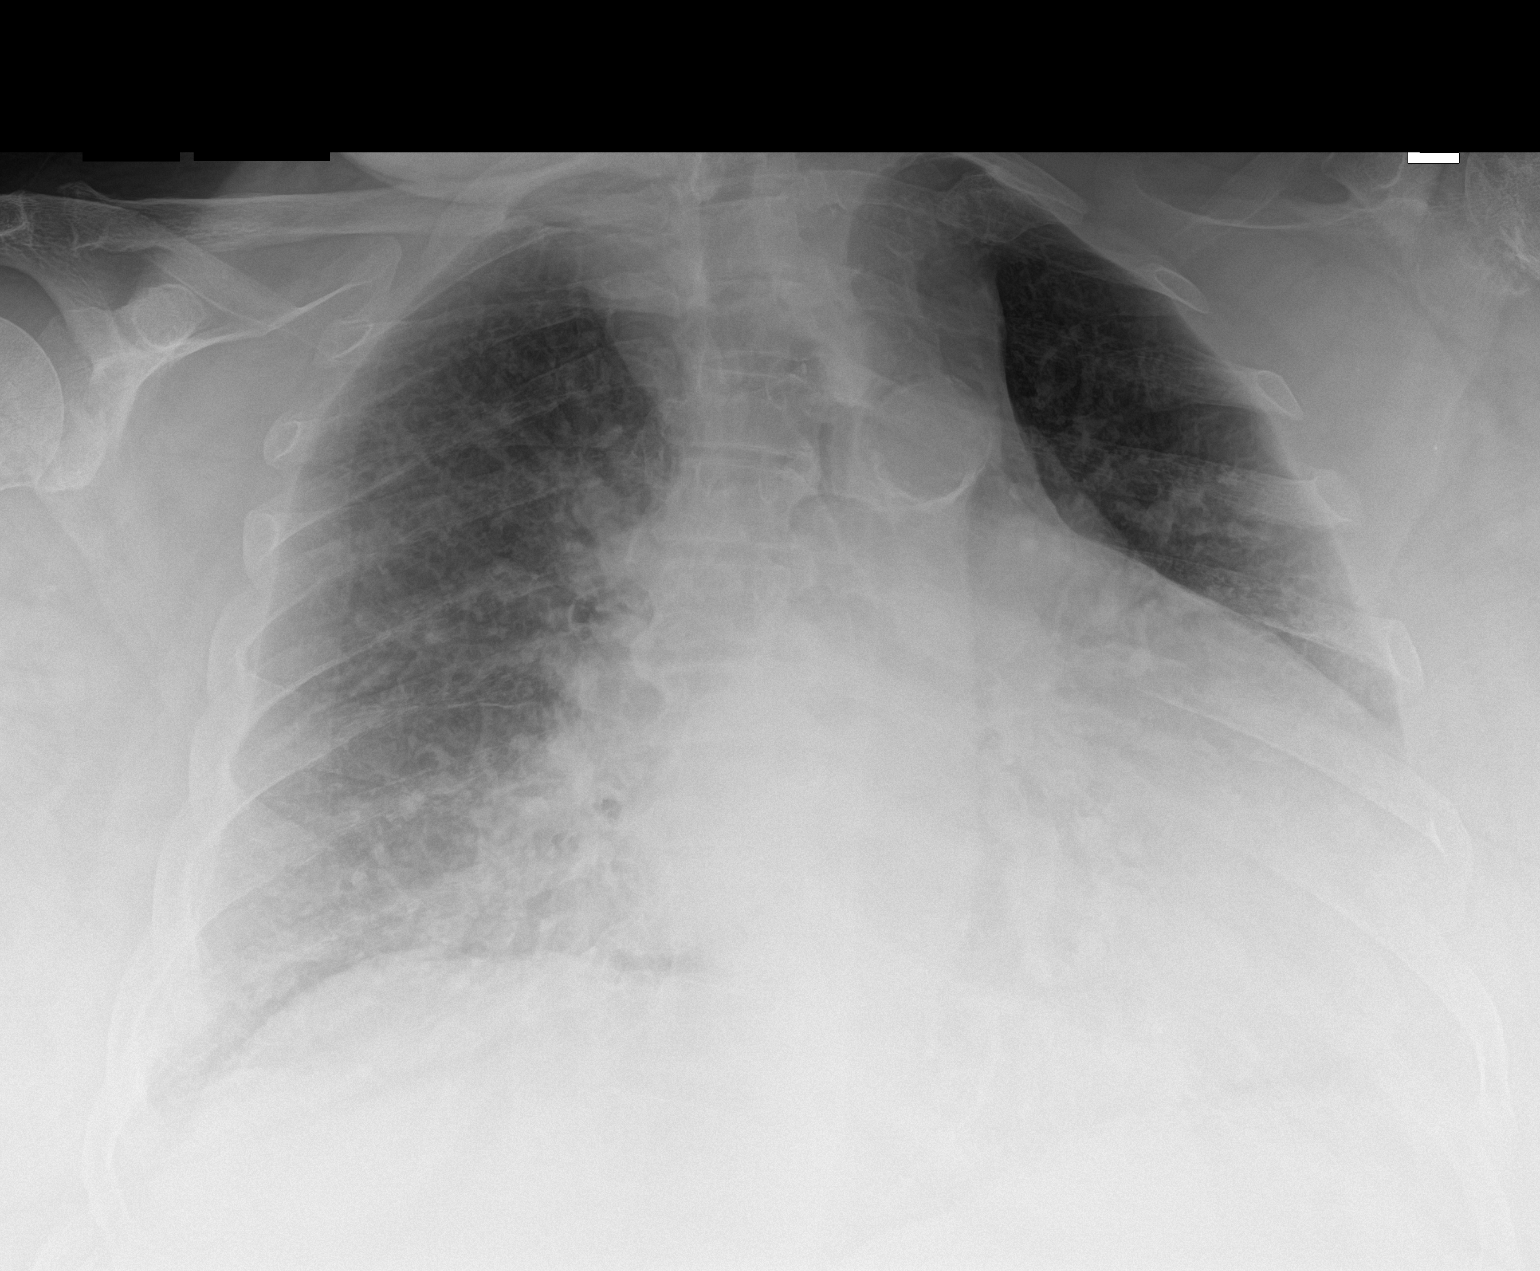

[1 of 1 positions shown; findings below may reference images not displayed]

FINDINGS: Stable cardiomegaly. Mild pulmonary vascular congestion. Right
middle lobe airspace opacity. There is likely a retrocardiac opacity
in the left lung base. No pneumothorax.
IMPRESSION: 1. Right middle lobe airspace opacity with additional probable left
lung base opacity. Findings could reflect pneumonia or aspiration.
2. Cardiomegaly with mild pulmonary vascular congestion.

## 2020-07-09 IMAGING — CR DG KNEE COMPLETE 4+V*L*
4 series · 4 of 4 positions shown · non-contrast
Comparison: None.

CLINICAL DATA: Recent fall with knee pain, initial encounter

EXAM:
LEFT KNEE - COMPLETE 4+ VIEW

[knee ap]
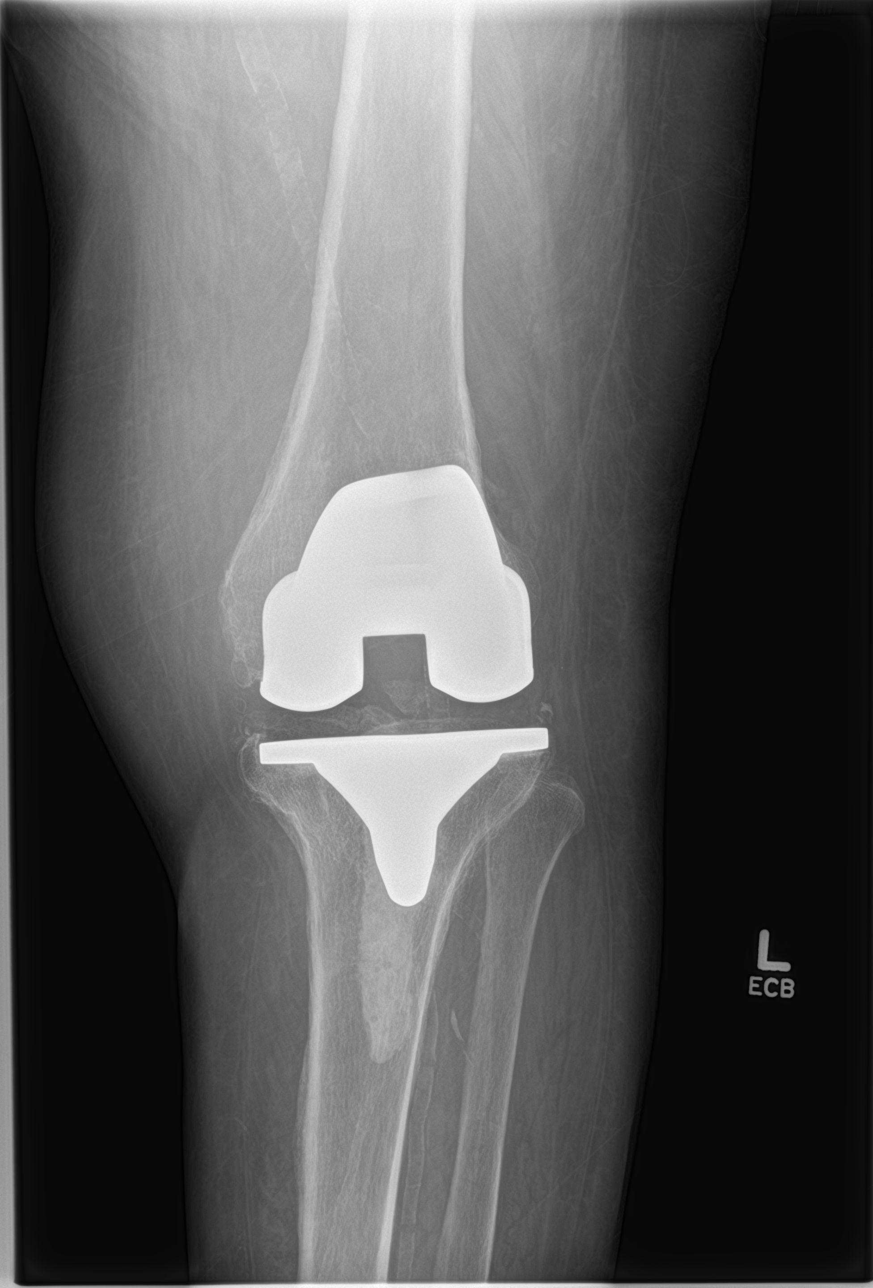

[knee lat]
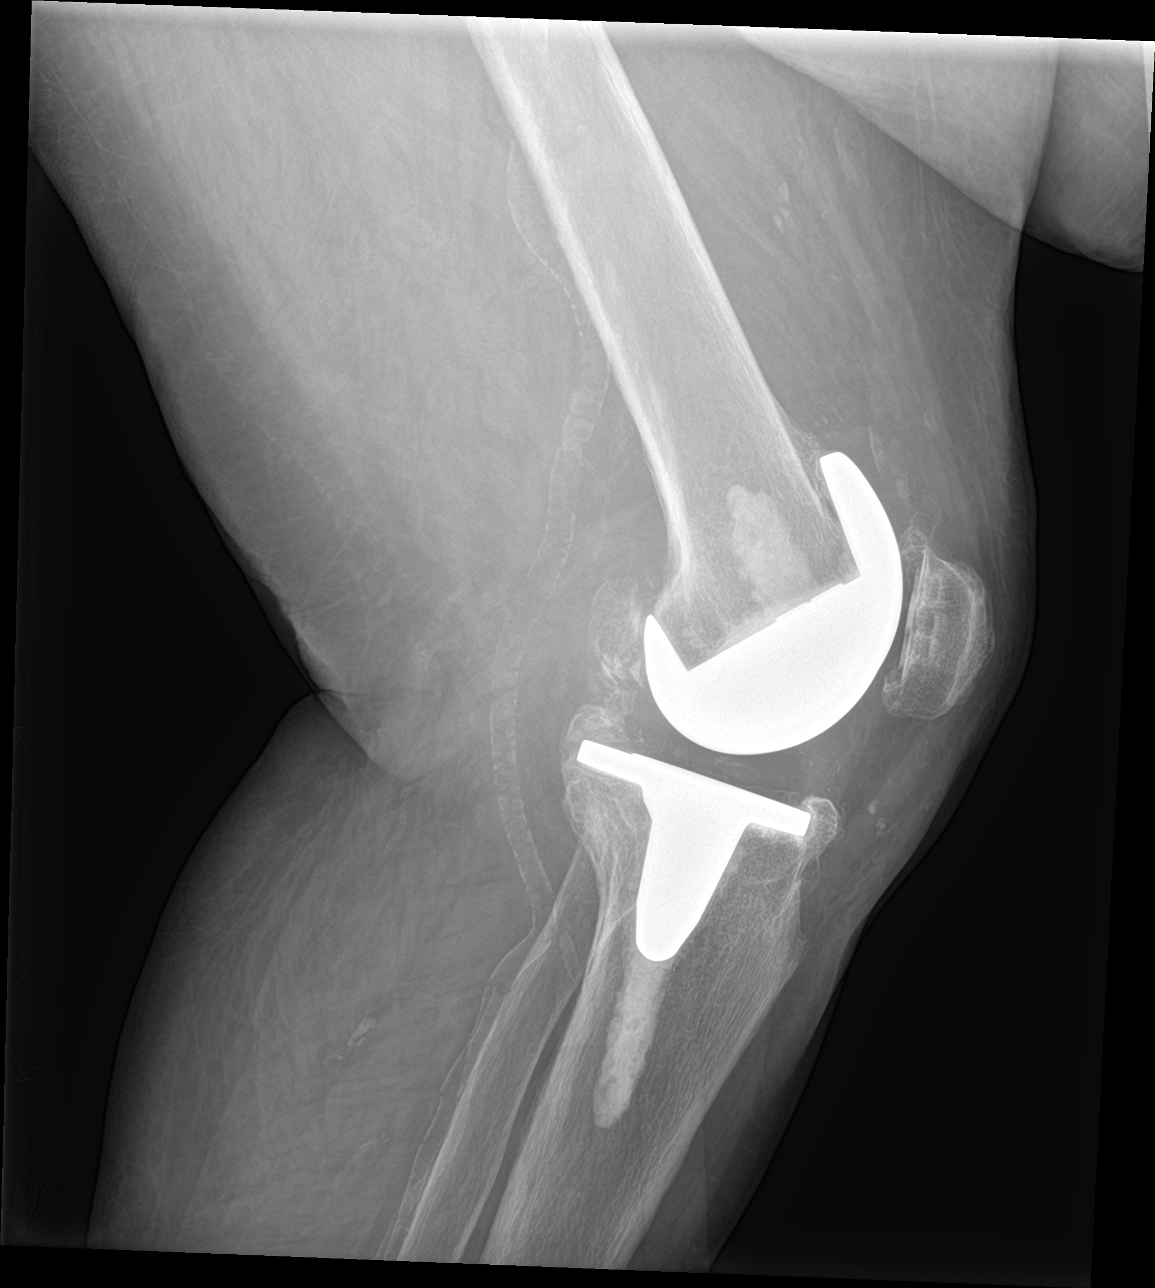

[knee obl (1 of 2)]
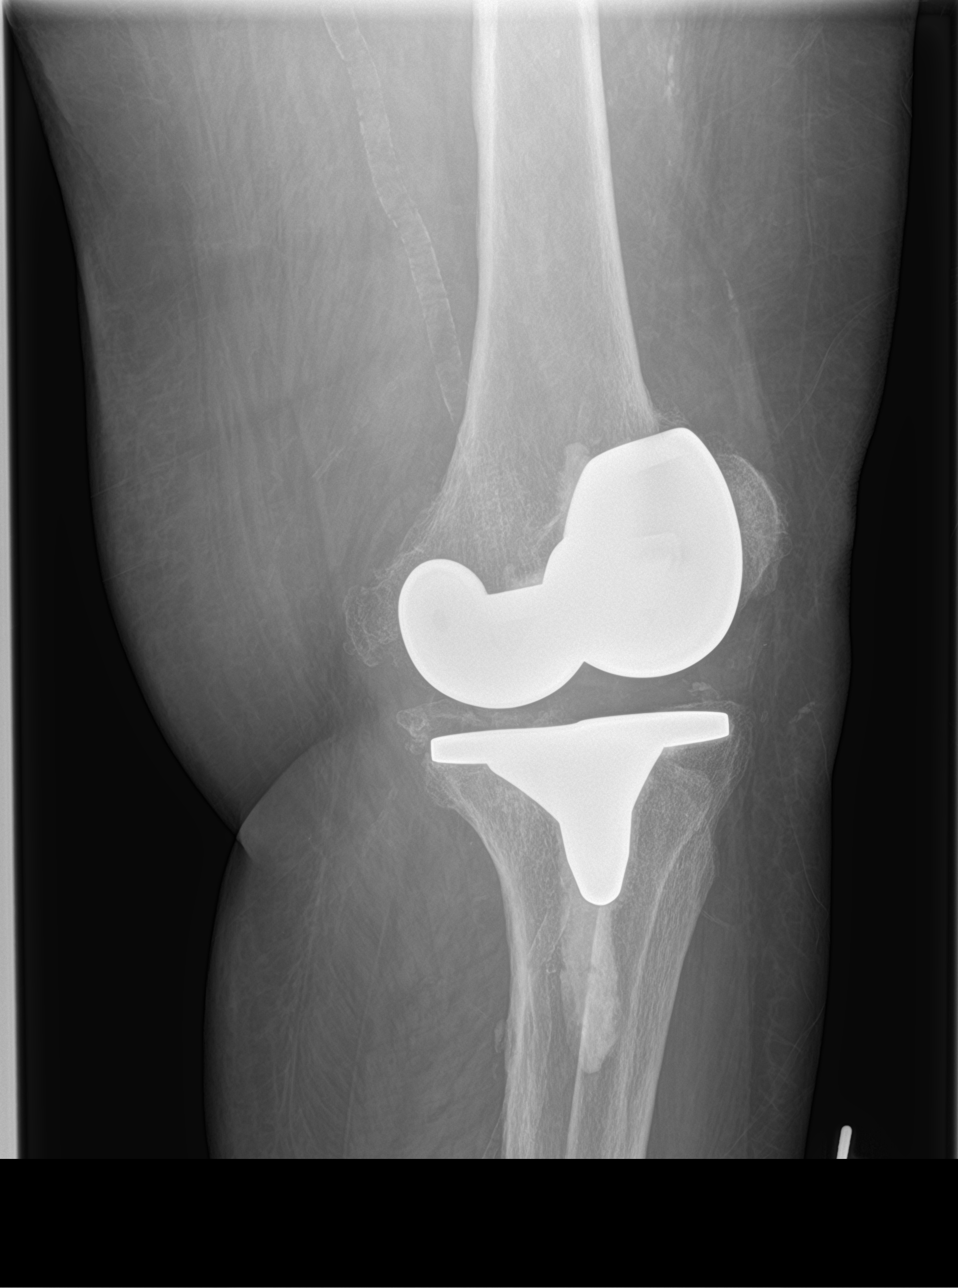

[knee obl (2 of 2)]
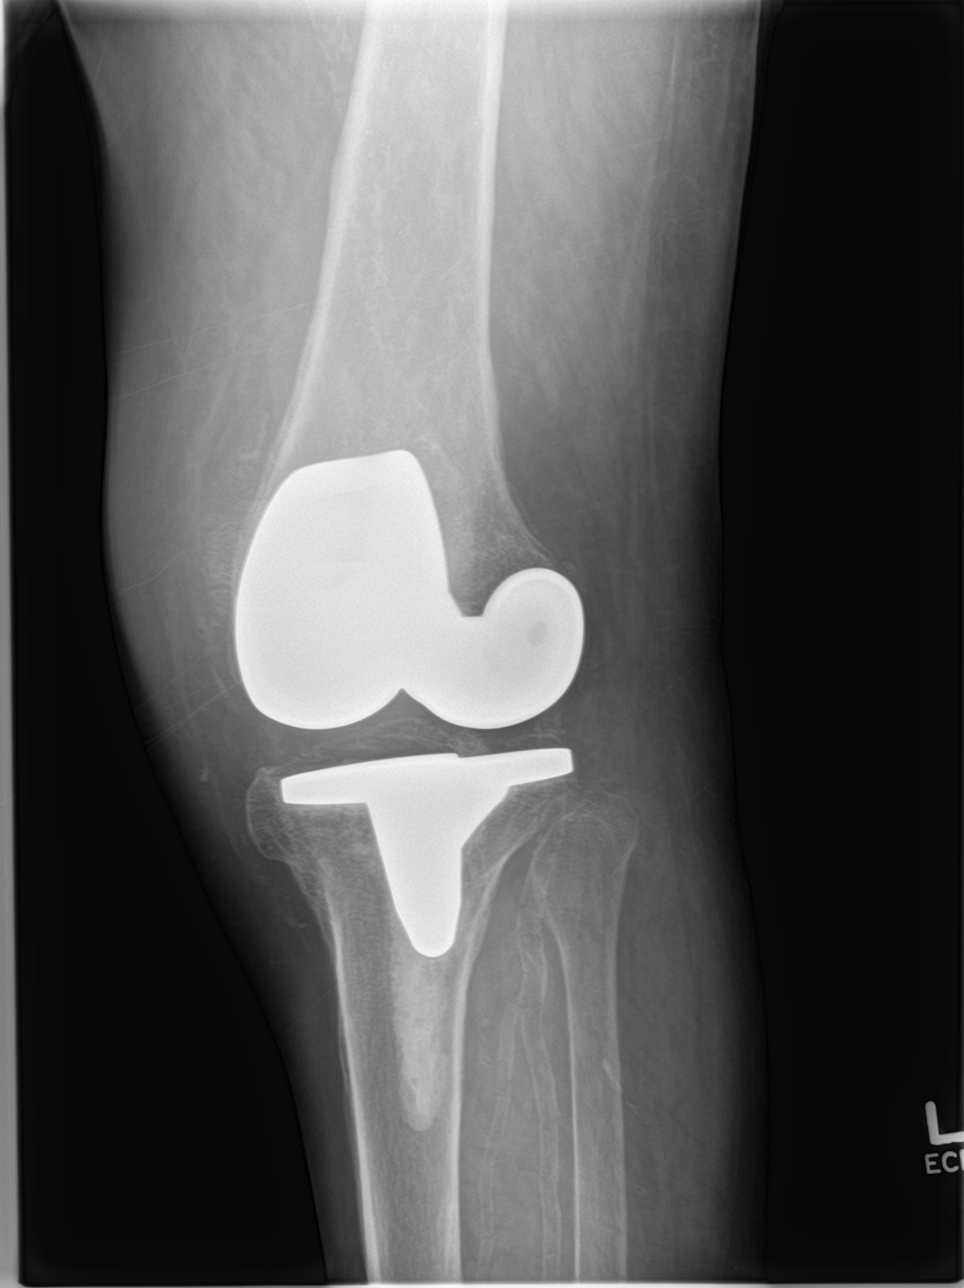

[4 of 4 positions shown; findings below may reference images not displayed]

FINDINGS: Left knee prosthesis is noted. No acute fracture or dislocation is
noted. Diffuse vascular calcifications are seen. No definitive
loosening is seen.
IMPRESSION: Status post left knee replacement without acute abnormality.

## 2020-07-09 IMAGING — CR DG LUMBAR SPINE COMPLETE 4+V
5 series · 5 of 5 positions shown · non-contrast
Comparison: Radiograph and MRI 10/05/2018

CLINICAL DATA: Post fall. Patient reports low back pain after
multiple falls in the past 2 weeks in her home.

EXAM:
LUMBAR SPINE - COMPLETE 4+ VIEW

[l-spine ap]
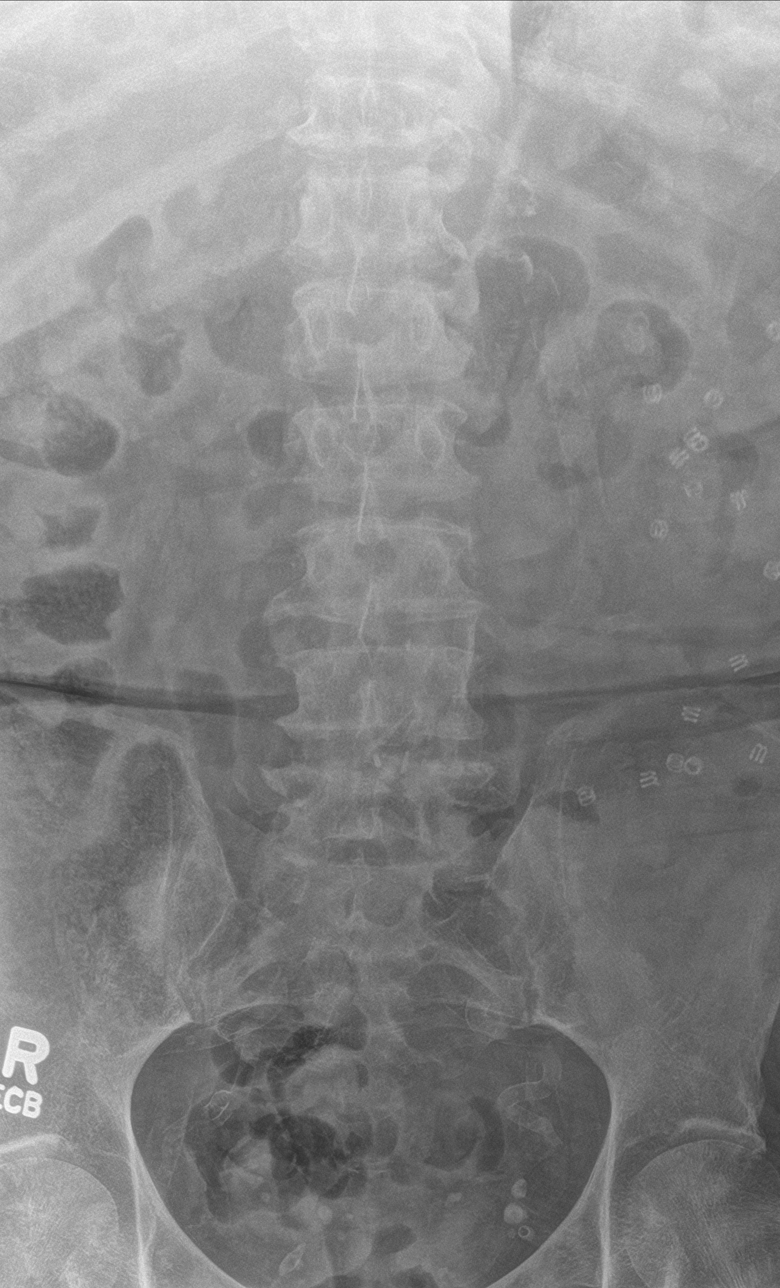

[l-spine obl (1 of 2)]
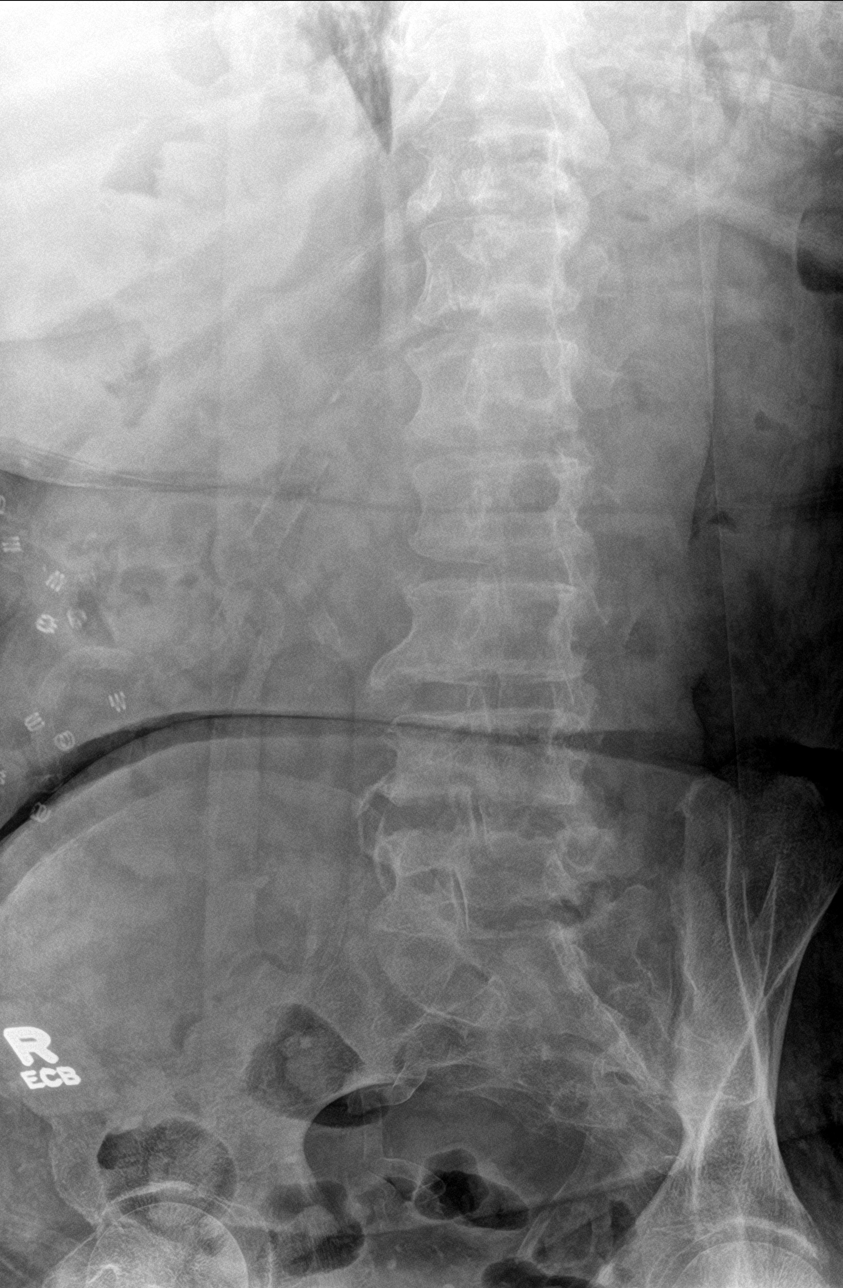

[l-spine obl (2 of 2)]
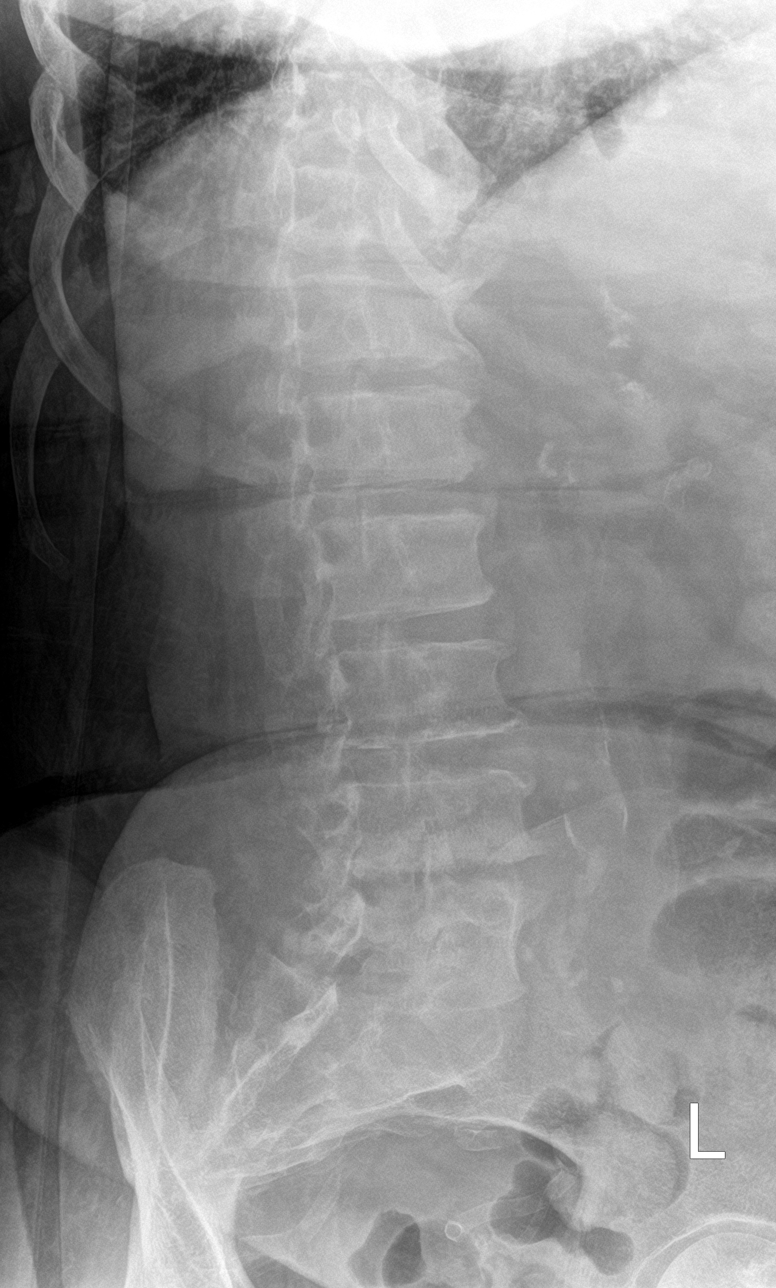

[l-spine lat]
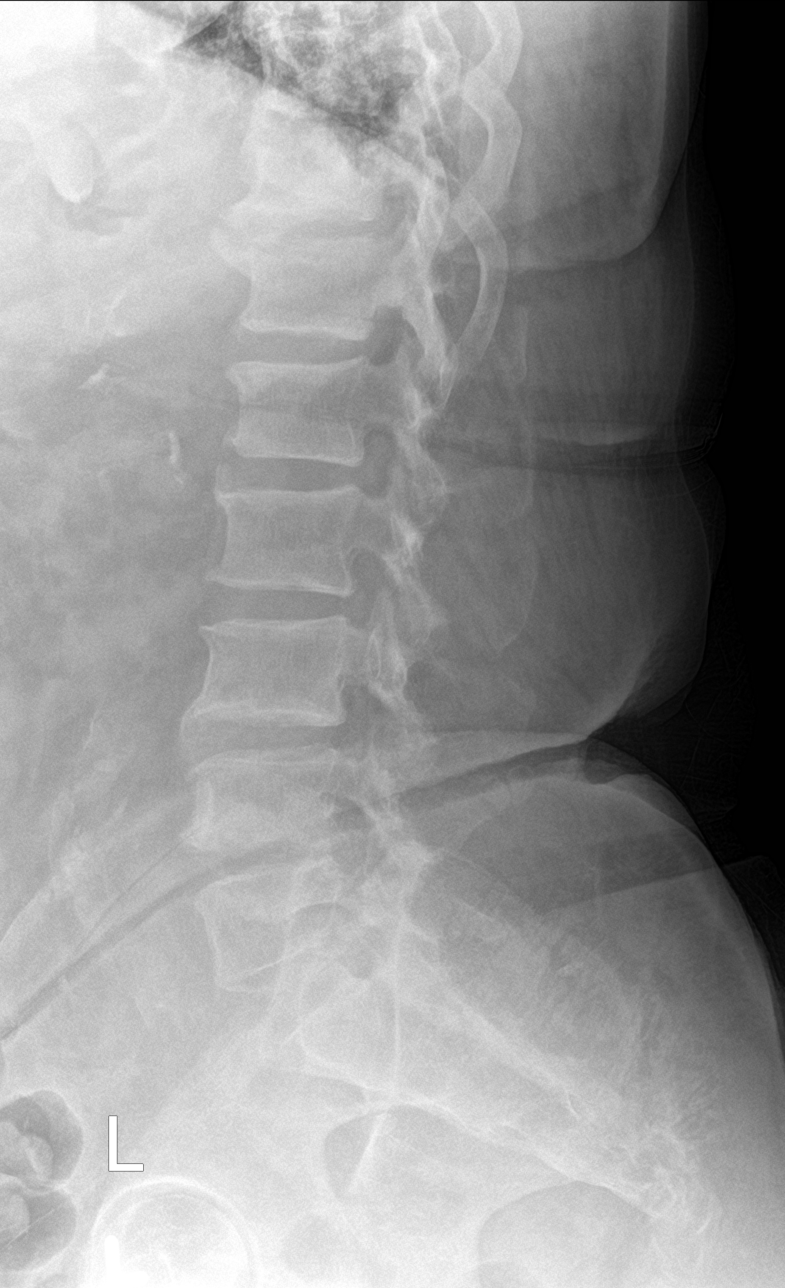

[l-spine spot]
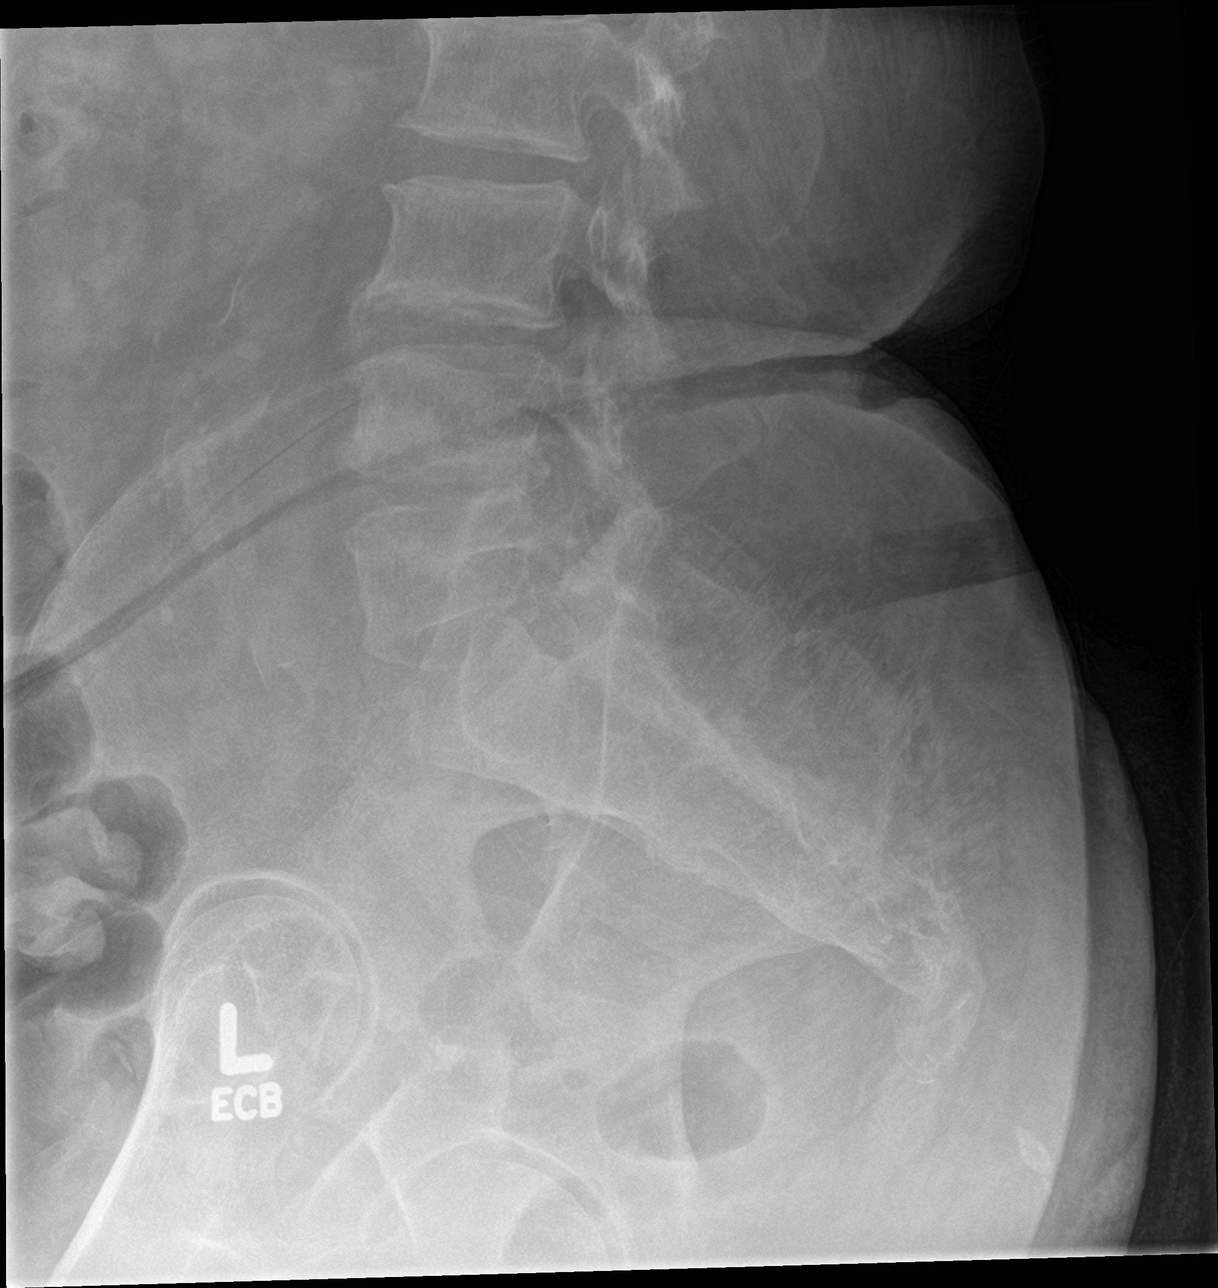

[5 of 5 positions shown; findings below may reference images not displayed]

FINDINGS: Stable aligned with mild straightening of normal lordosis. No
evidence of acute fracture. Vertebral body heights are preserved.
Degenerative disc disease at L3-L4 and L4-L5, similar in appearance
to prior exam. Facet hypertrophy most prominent at L3-L4 and L4-L5.
Advanced vascular calcifications.
IMPRESSION: 1. No evidence of acute fracture or subluxation of the lumbar spine.
2. Degenerative disc disease and facet hypertrophy, similar in
appearance to prior exam.

## 2020-07-09 IMAGING — CR DG KNEE COMPLETE 4+V*R*
4 series · 4 of 4 positions shown · non-contrast
Comparison: 12/07/2003

CLINICAL DATA: Bilateral knee and lower back pain, multiple falls

EXAM:
RIGHT KNEE - COMPLETE 4+ VIEW

[knee ap]
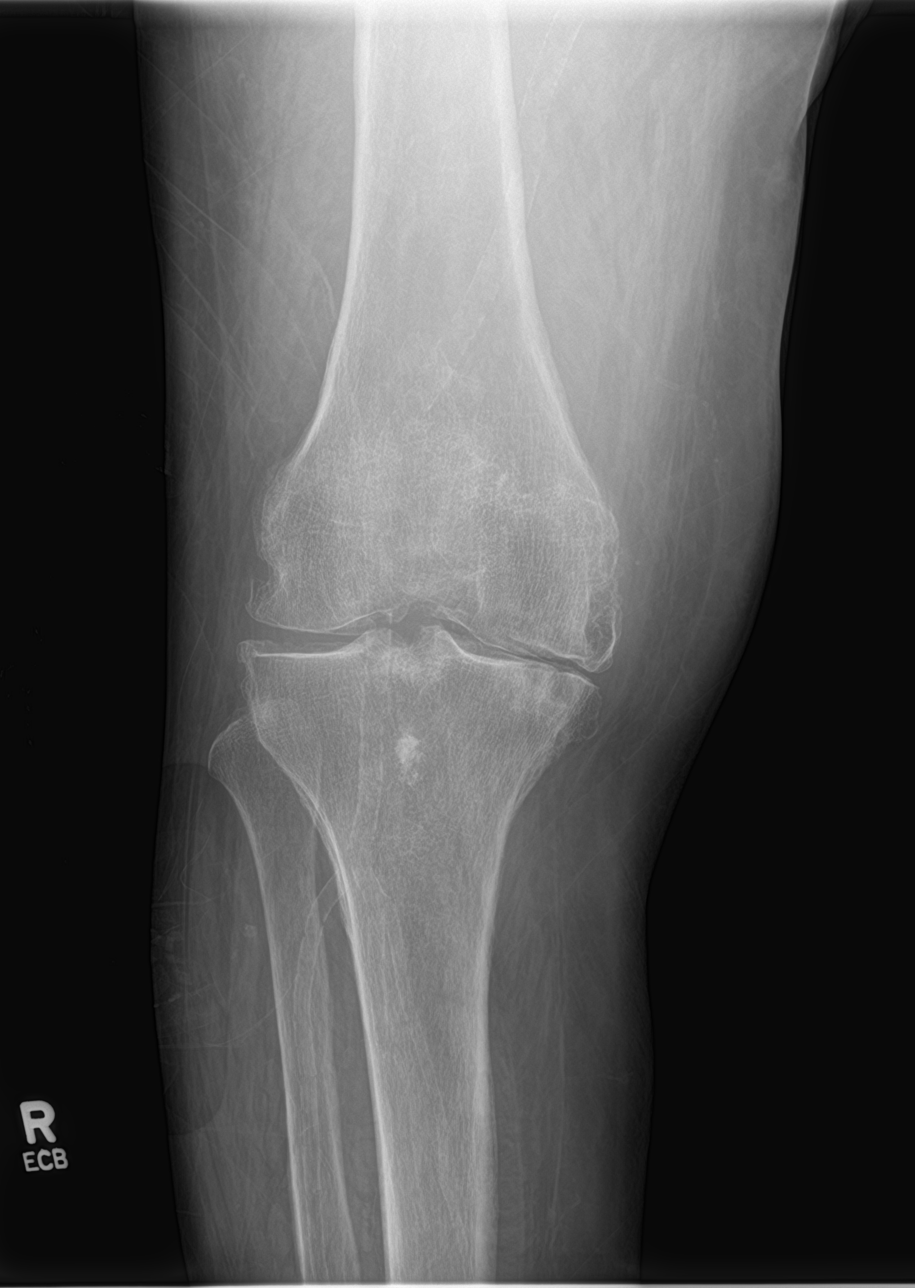

[knee lat]
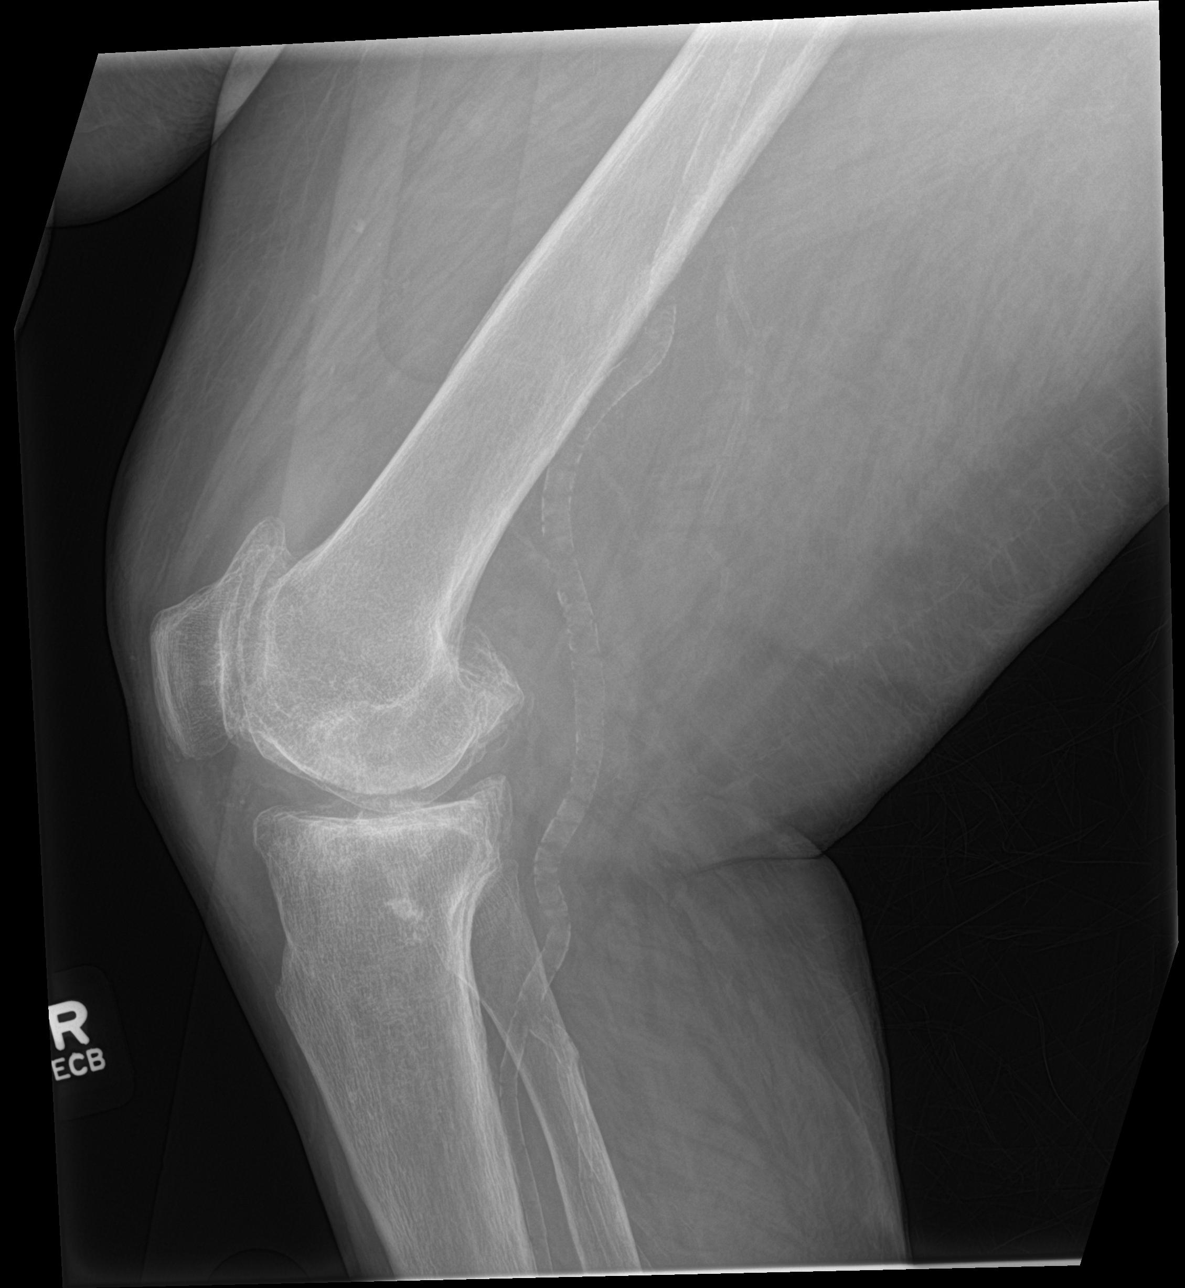

[knee obl (1 of 2)]
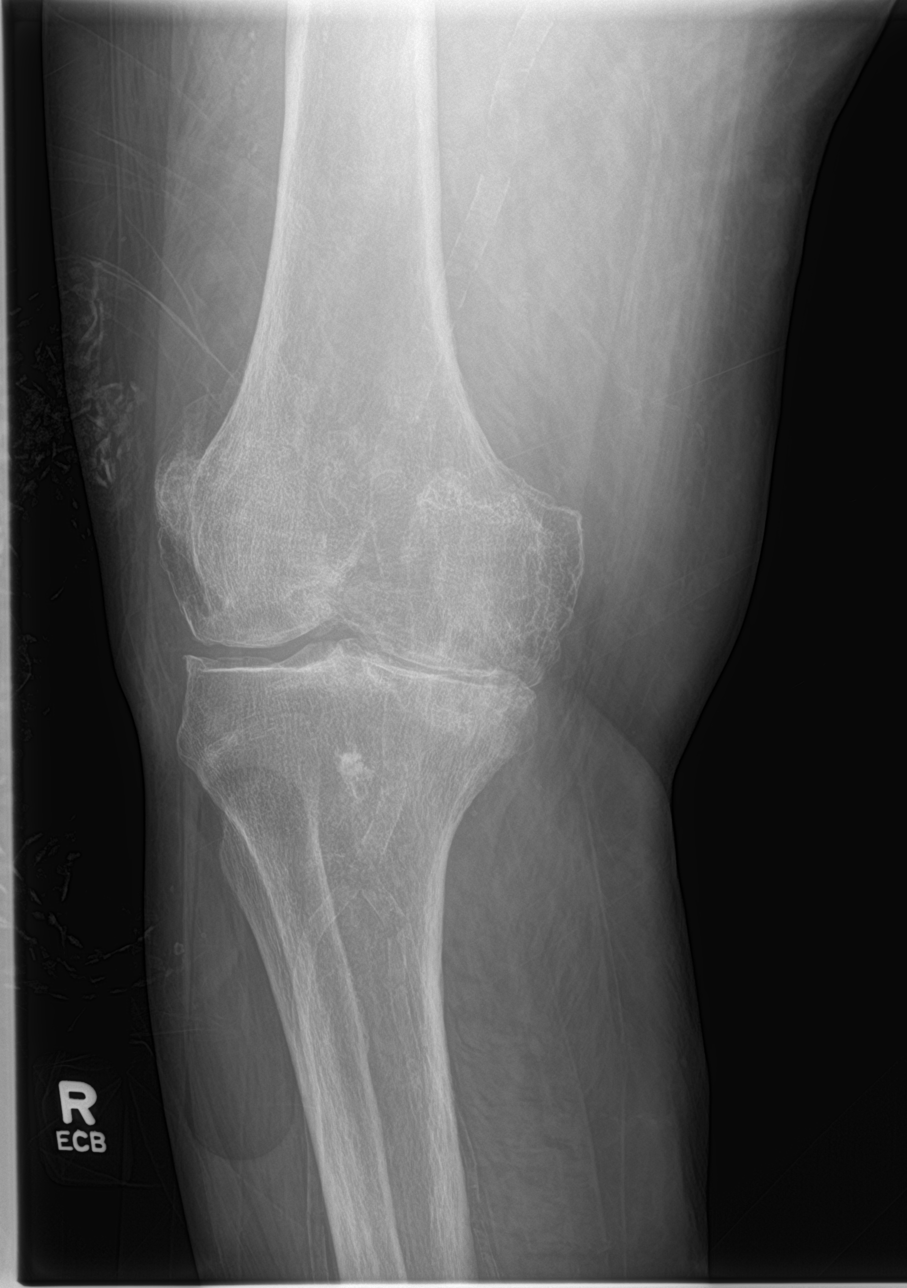

[knee obl (2 of 2)]
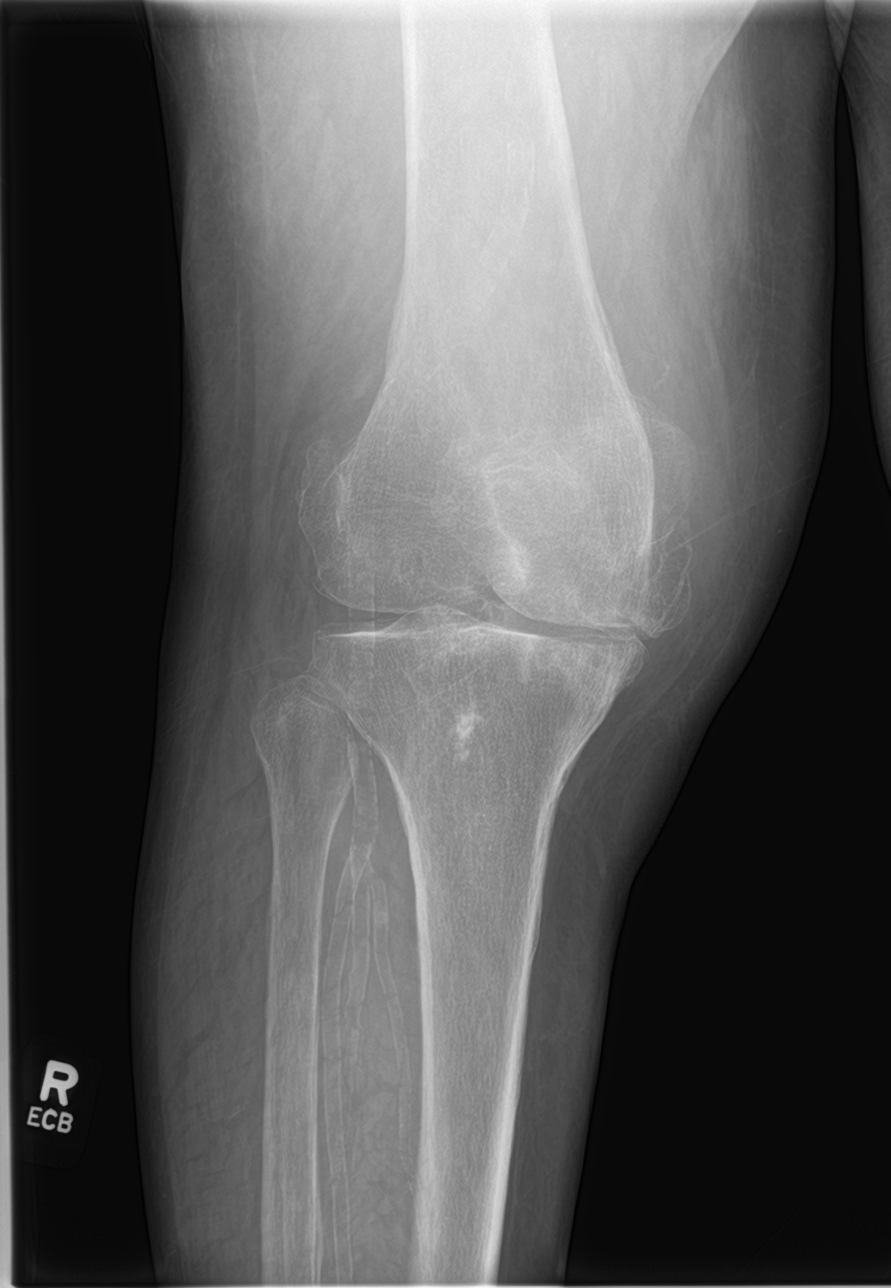

[4 of 4 positions shown; findings below may reference images not displayed]

FINDINGS: Frontal, bilateral oblique, lateral views of the right knee are
obtained. There is severe 3 compartment osteoarthritis greatest in
the medial and patellofemoral compartments. There are no acute
displaced fractures. No joint effusion. Diffuse vascular
calcifications.
IMPRESSION: 1. Severe 3 compartment osteoarthritis.  No displaced fracture.

## 2020-08-06 IMAGING — CT CT HEAD W/O CM
4 series · 16 of 47 positions shown, 18 images · non-contrast
Comparison: 12/04/2014

CLINICAL DATA: Headache, acute.

EXAM:
CT HEAD WITHOUT CONTRAST
TECHNIQUE: Contiguous axial images were obtained from the base of the skull
through the vertex without intravenous contrast.

[Series 3: head without · axial · non-contrast · 0.45mm/px · z∈[-190,-70]mm · 7 of 33 slices shown, 9 images]
[im 5/33  brain]
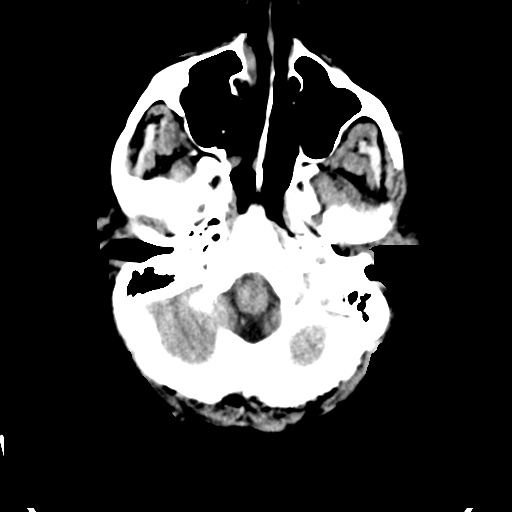
[im 5/33  bone]
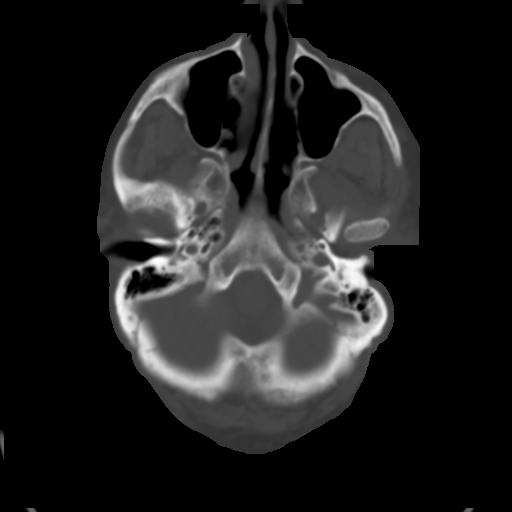
[im 9/33  brain]
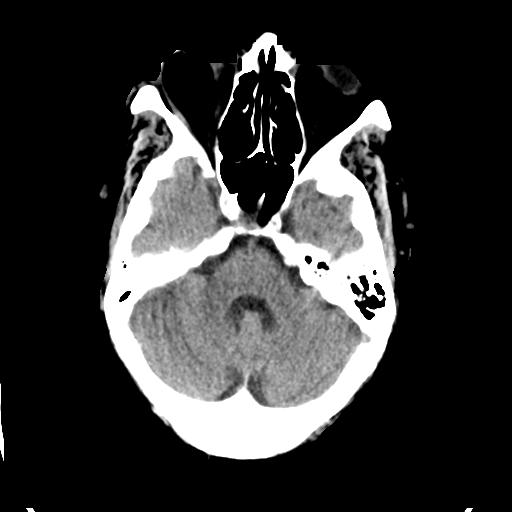
[im 13/33  brain]
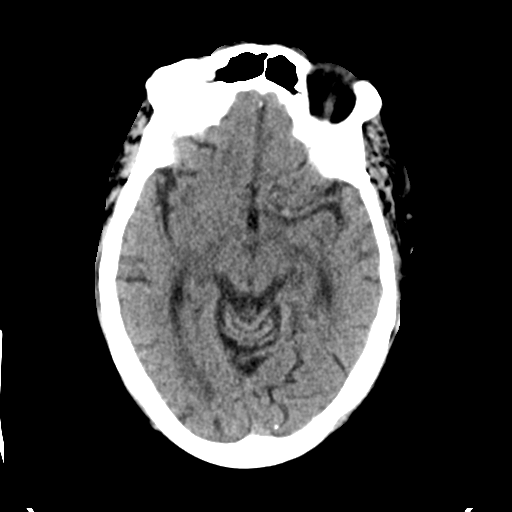
[im 17/33  brain]
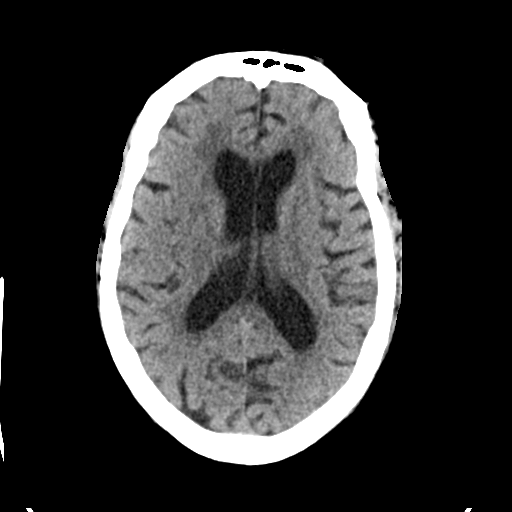
[im 21/33  brain]
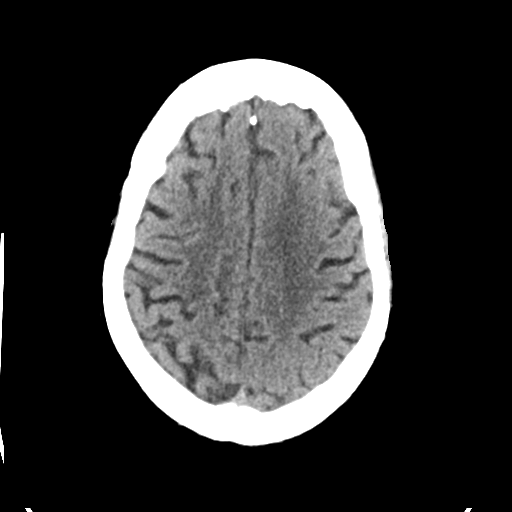
[im 21/33  bone]
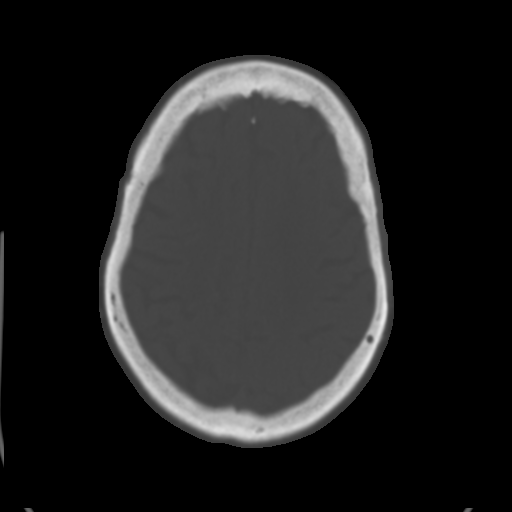
[im 25/33  brain]
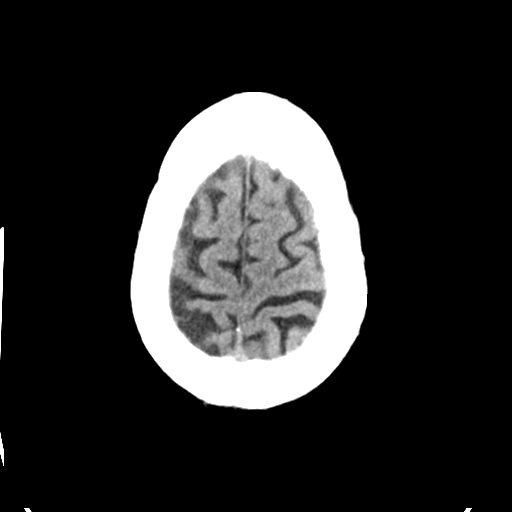
[im 29/33  brain]
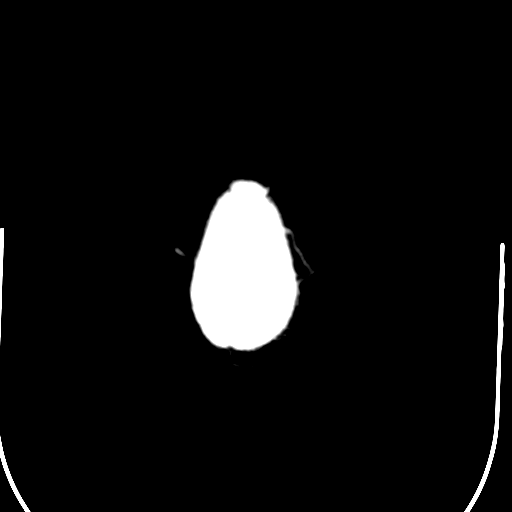

[Series 4: head bone · axial · 0.45mm/px · z∈[-194,-162]mm · 3 of 82 slices shown]
[im 9/82  bone]
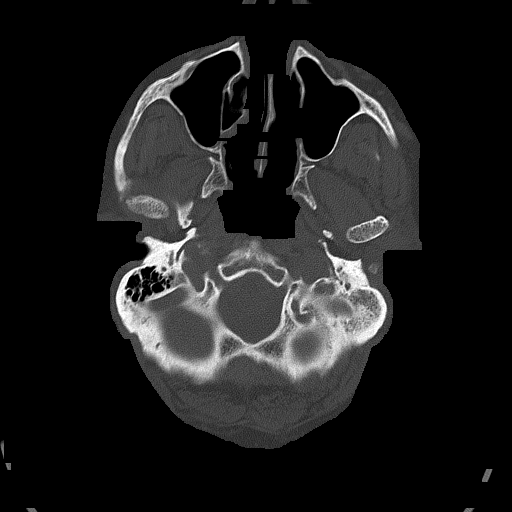
[im 17/82  bone]
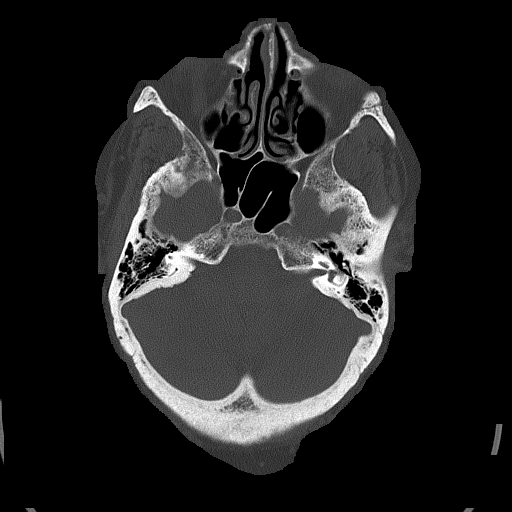
[im 25/82  bone]
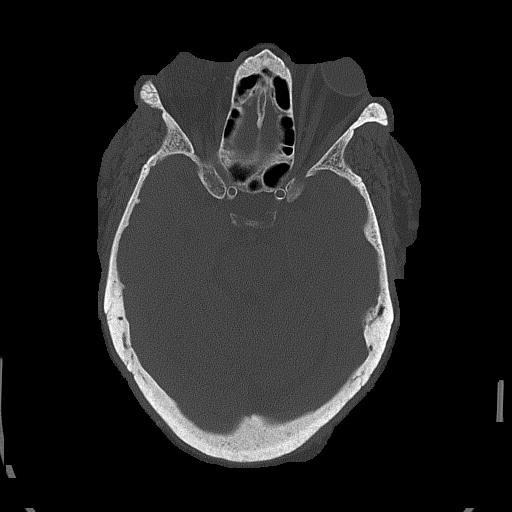

[Series 5: head without cor · coronal · non-contrast · 0.32mm/px · 3 of 67 slices shown]
[im 24/67  brain]
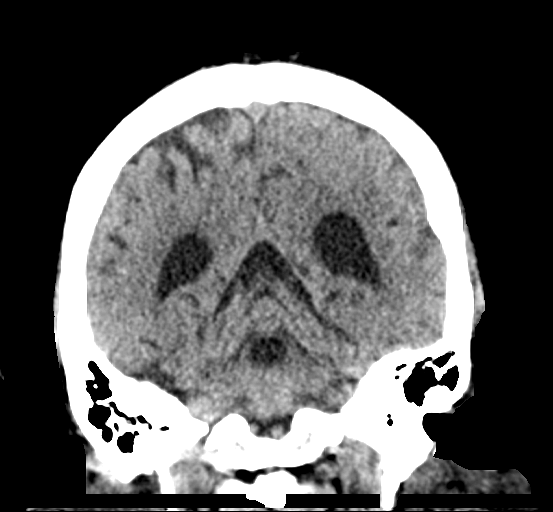
[im 30/67  brain]
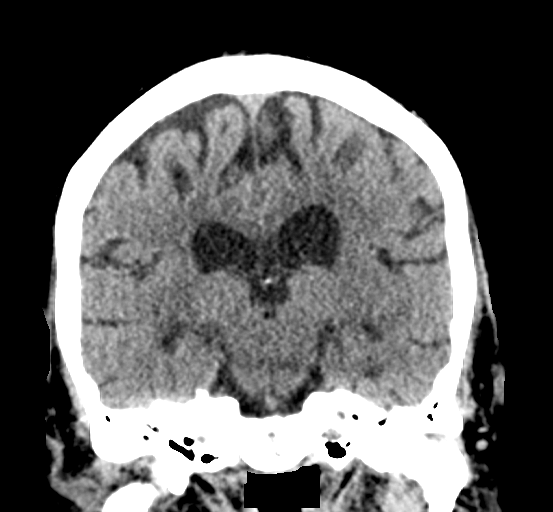
[im 37/67  brain]
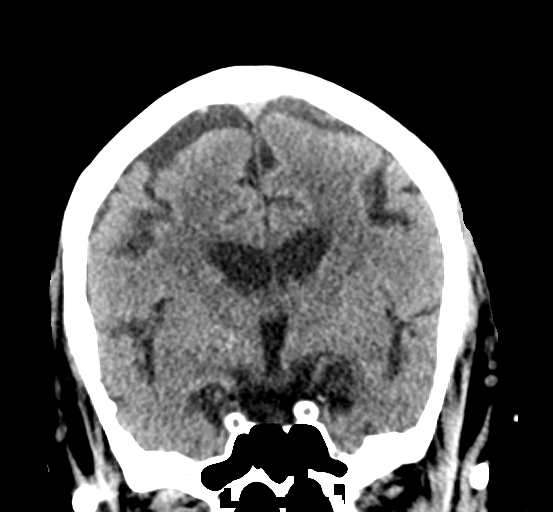

[Series 6: head without sag · sagittal · non-contrast · 0.33mm/px · 3 of 49 slices shown]
[im 17/49  brain]
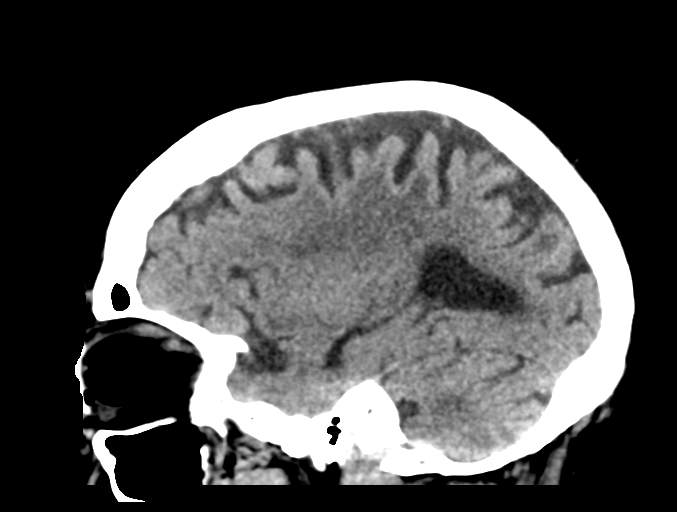
[im 25/49  brain]
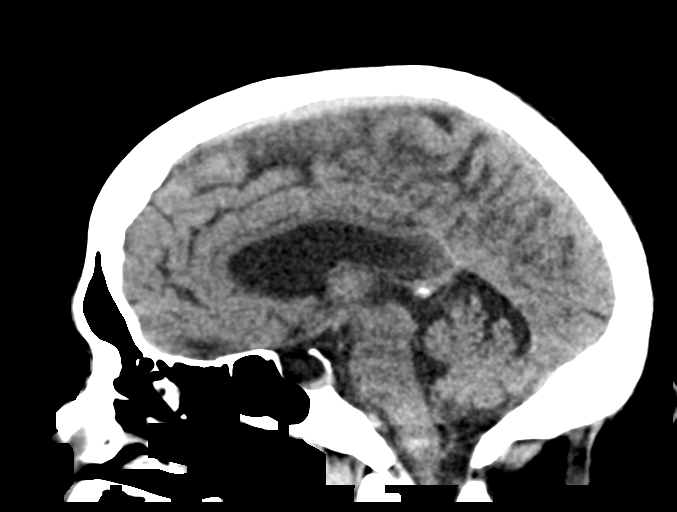
[im 33/49  brain]
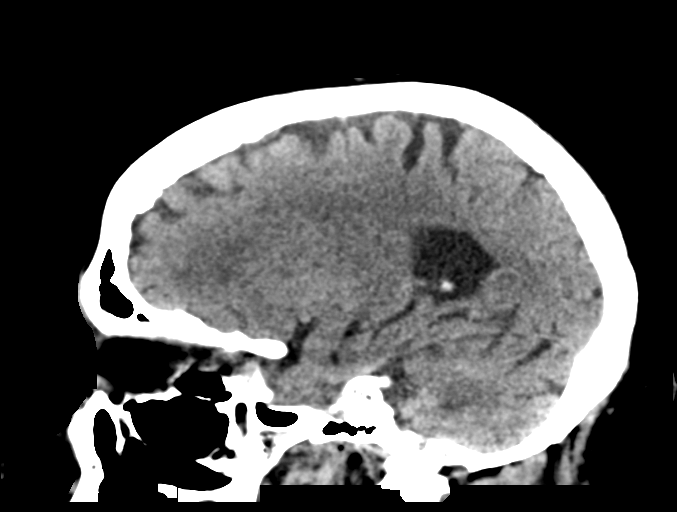

[16 of 47 positions shown; findings below may reference images not displayed]

FINDINGS: Brain: No evidence of acute infarction, hemorrhage, hydrocephalus,
extra-axial collection or mass lesion/mass effect. Signs of chronic
microvascular ischemic change and atrophy similar to the prior
study.

Vascular: No hyperdense vessel or unexpected calcification.

Skull: Coarsened trabeculation compatible with renal osteodystrophy,
no acute findings.

Sinuses/Orbits: Visualized paranasal sinuses are unremarkable. Signs
of right globe resection with presumed prosthesis is un changed.

Other: None.
IMPRESSION: 1. No acute intracranial pathology.
2. Signs of chronic microvascular ischemic change and atrophy
similar to the prior study.

## 2020-08-06 IMAGING — MR MR HEAD W/O CM
12 of 13 series · 44 of 48 positions shown · non-contrast
Comparison: CT same day. MRI 12/06/2014.

CLINICAL DATA: Acute presentation with headache.

EXAM:
MRI HEAD WITHOUT CONTRAST
TECHNIQUE: Multiplanar, multiecho pulse sequences of the brain and surrounding
structures were obtained without intravenous contrast.

[Series 5: DWI · axial · 3.0mm · 0.88mm/px · z∈[-99,+49]mm · 9 of 104 slices shown (1 of 4)]
[im 1/104]
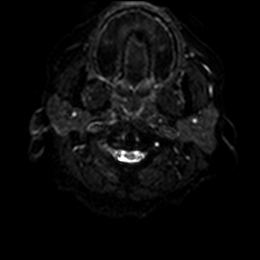
[im 13/104]
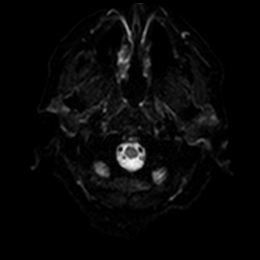
[im 26/104]
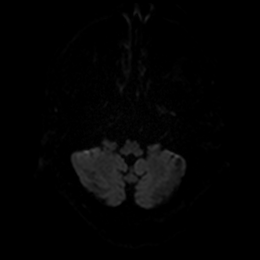
[im 39/104]
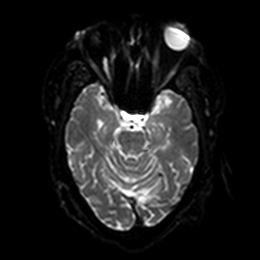
[im 52/104]
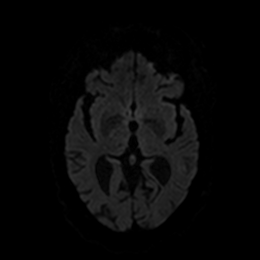
[im 65/104]
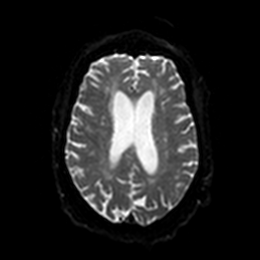
[im 78/104]
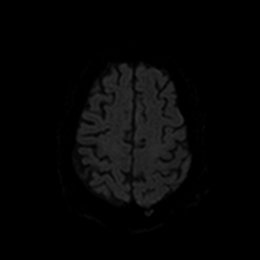
[im 91/104]
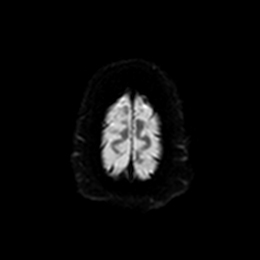
[im 104/104]
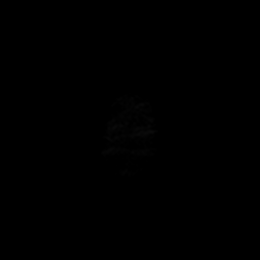

[Series 6: DWI · axial · 3.0mm · 0.88mm/px · z∈[-99,+49]mm · 4 of 52 slices shown (2 of 4)]
[im 1/52]
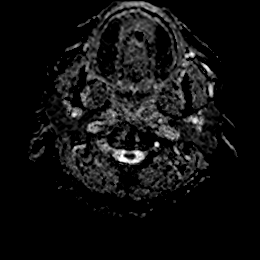
[im 18/52]
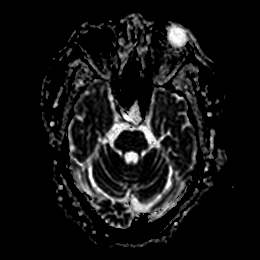
[im 35/52]
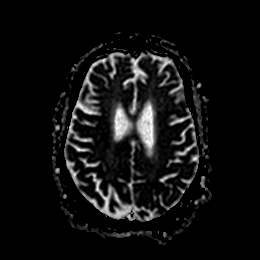
[im 52/52]
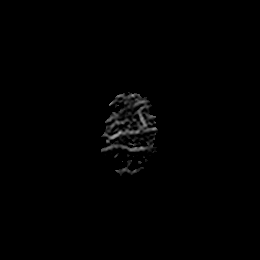

[Series 7: DWI · coronal · 4.0mm · 0.88mm/px · 5 of 72 slices shown (3 of 4)]
[im 1/72]
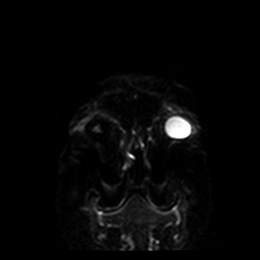
[im 18/72]
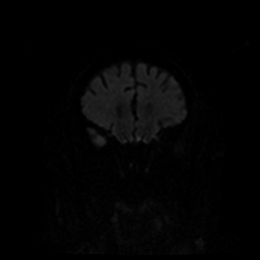
[im 36/72]
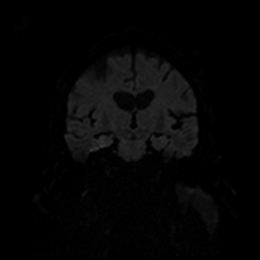
[im 54/72]
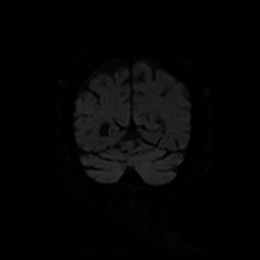
[im 72/72]
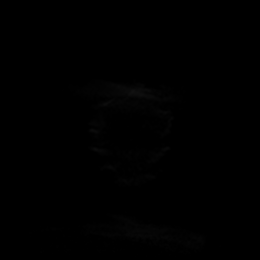

[Series 8: DWI · coronal · 4.0mm · 0.88mm/px · 3 of 36 slices shown (4 of 4)]
[im 1/36]
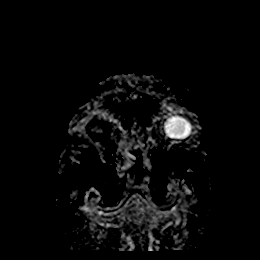
[im 18/36]
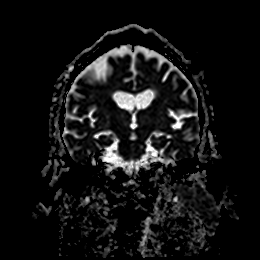
[im 36/36]
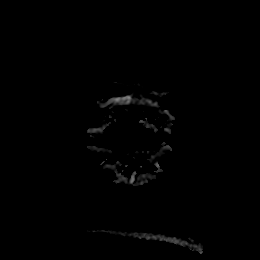

[Series 9: T1 · sagittal · 5.0mm · 0.78mm/px · 1 of 20 slices shown]
[im 1/20]
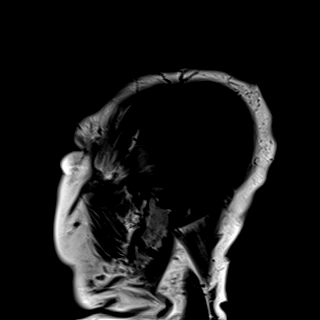

[Series 10: T2 · axial · 5.0mm · 0.72mm/px · z∈[-100,+39]mm · 2 of 25 slices shown (1 of 2)]
[im 1/25]
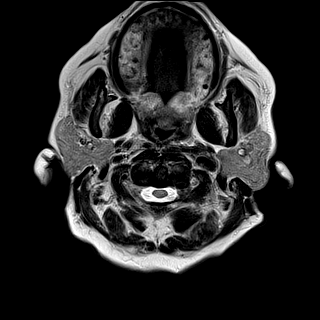
[im 25/25]
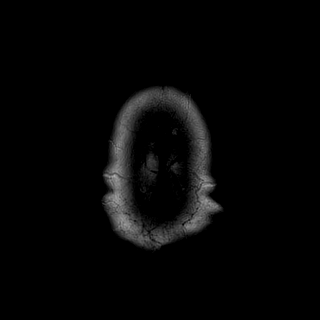

[Series 11: FLAIR · axial · 5.0mm · 0.45mm/px · z∈[-98,+41]mm · 2 of 25 slices shown]
[im 1/25]
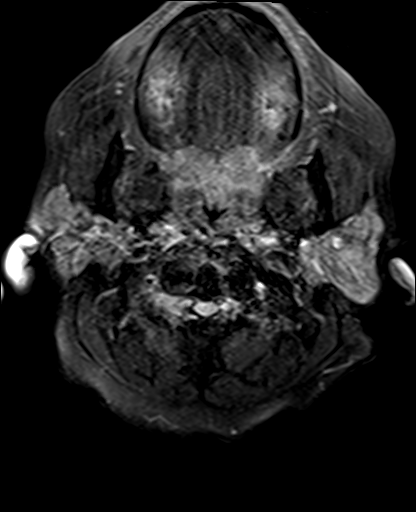
[im 25/25]
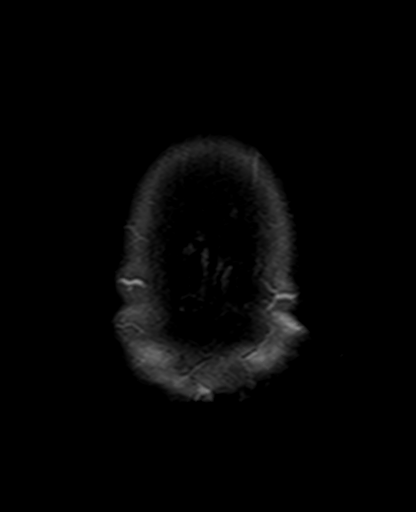

[Series 12: mag_images · axial · 3.0mm · 0.90mm/px · z∈[-106,+65]mm · 4 of 60 slices shown]
[im 1/60]
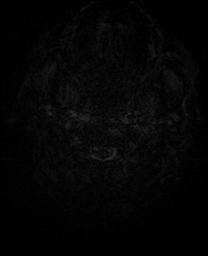
[im 20/60]
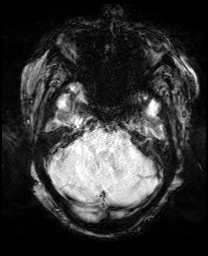
[im 40/60]
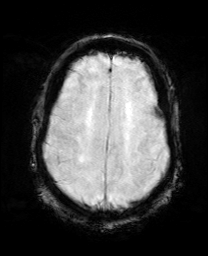
[im 60/60]
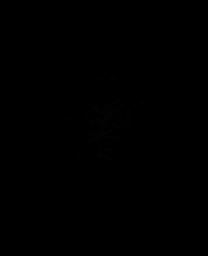

[Series 13: pha_images · axial · 3.0mm · 0.90mm/px · z∈[-106,+57]mm · 4 of 56 slices shown]
[im 1/56]
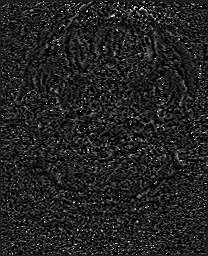
[im 19/56]
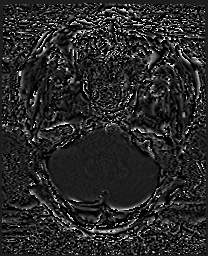
[im 37/56]
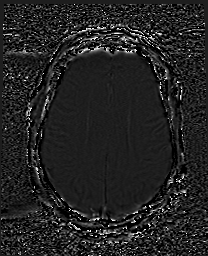
[im 56/56]
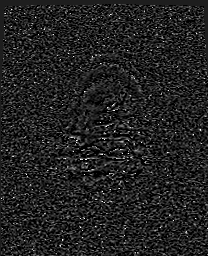

[Series 14: swi_images · axial · 3.0mm · 0.90mm/px · z∈[-106,+65]mm · 4 of 60 slices shown]
[im 1/60]
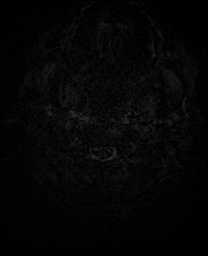
[im 20/60]
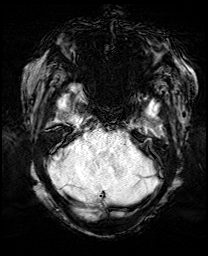
[im 40/60]
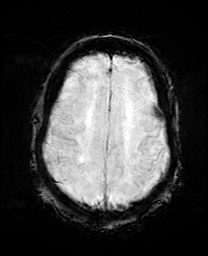
[im 60/60]
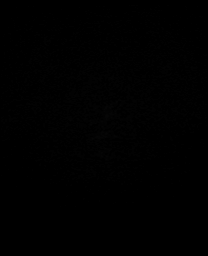

[Series 15: mip_images(sw) · axial · 24.0mm · 0.90mm/px · z∈[-95,+55]mm · 4 of 53 slices shown]
[im 1/53]
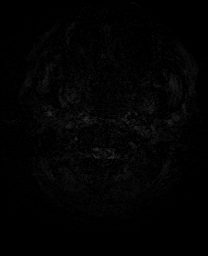
[im 18/53]
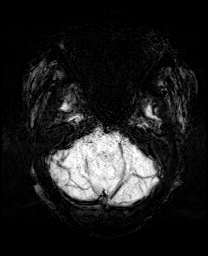
[im 35/53]
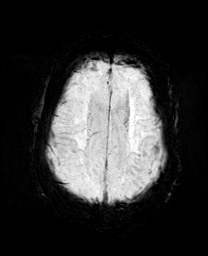
[im 53/53]
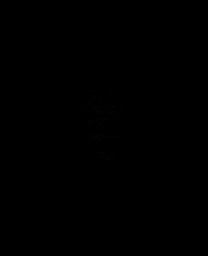

[Series 17: T2 · coronal · 5.0mm · 0.34mm/px · 2 of 29 slices shown (2 of 2)]
[im 1/29]
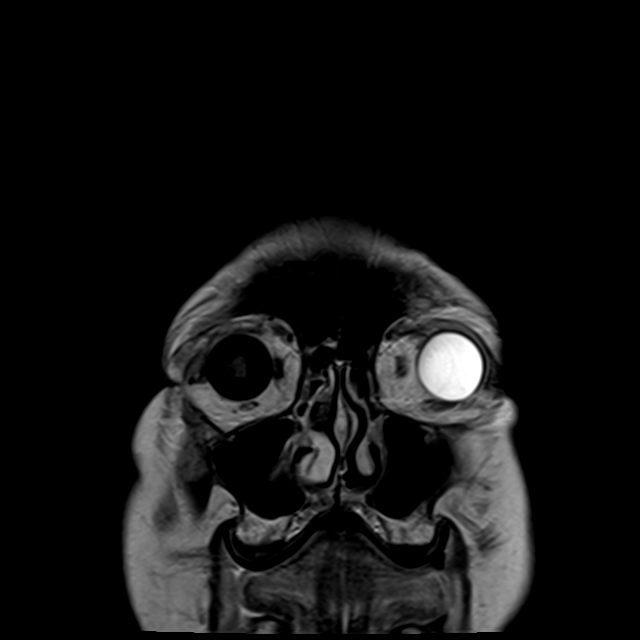
[im 29/29]
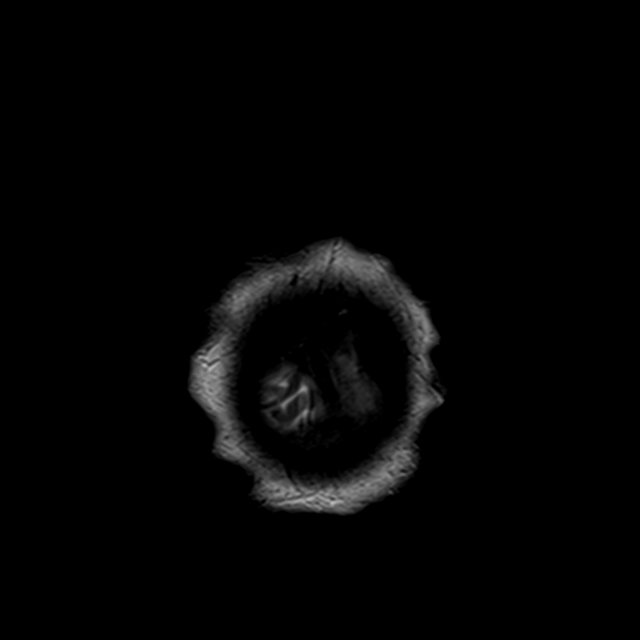

[44 of 48 positions shown; findings below may reference images not displayed]

FINDINGS: Brain: Diffusion imaging does not show any acute infarction. There
chronic small-vessel ischemic changes of the pons. Few old small
vessel cerebellar infarctions. Cerebral hemispheres show moderate
chronic small-vessel changes of the white matter. Old lacunar
infarction of the right thalamus and left basal ganglia. Small old
left occipital cortical and subcortical infarction. No large vessel
territory infarction. No mass lesion, hemorrhage, hydrocephalus or
extra-axial collection.

Vascular: Major vessels at the base of the brain show flow.

Skull and upper cervical spine: Negative

Sinuses/Orbits: Clear/normal

Other: None
IMPRESSION: No acute finding by MRI. Old left occipital cortical and subcortical
infarction. Chronic small-vessel ischemic changes throughout the
brain elsewhere as outlined above.

## 2020-08-16 IMAGING — DX DG CHEST 1V PORT
1 series · 1 of 1 positions shown · non-contrast
Comparison: None.

CLINICAL DATA: Nasogastric tube placement.

EXAM:
PORTABLE CHEST 1 VIEW

[chest]
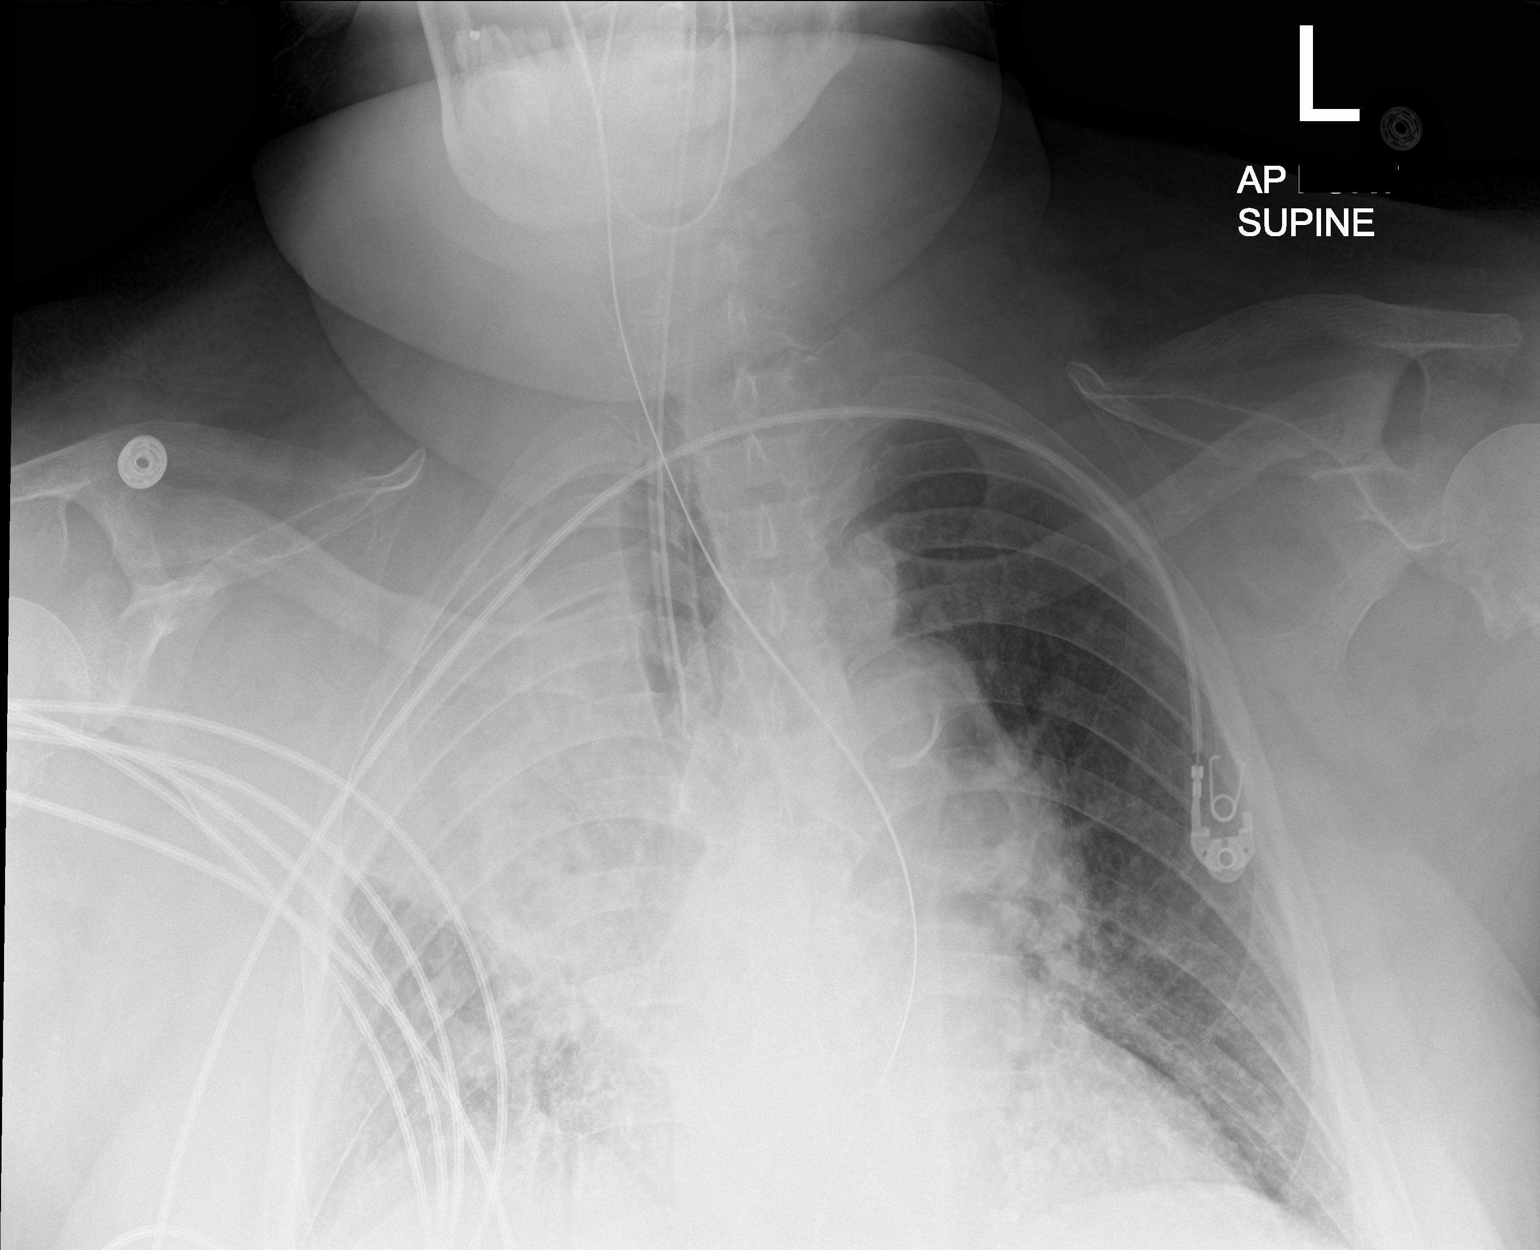

[1 of 1 positions shown; findings below may reference images not displayed]

FINDINGS: An endotracheal tube is seen with its distal tip approximately
cm from the carina.

A nasogastric tube is noted. Its distal end is limited in
visualization secondary to image cut off and appears to sit within
the distal aspect of the esophagus.

There is marked severity opacifications of the right upper lobe
which represents a new finding when compared to the prior exam.

Mild atelectasis and/or infiltrate is seen within the bilateral lung
bases.

The costophrenic angles are limited in evaluation secondary to image
con off.

The cardiac silhouette is enlarged and unchanged in size.

Degenerative changes are seen throughout the thoracic spine.
IMPRESSION: 1. Endotracheal tube and nasogastric tube as described above.
Further advancement of the NG tube by approximately 8 cm is
recommended to decrease the risk of aspiration.
2. Marked severity opacification of the right upper lobe which
represents a new finding when compared to prior exam and is likely
consistent with marked severity atelectasis and/or infiltrate.

## 2020-08-16 IMAGING — CT CT HEAD W/O CM
4 series · 16 of 47 positions shown, 18 images · non-contrast
Comparison: None.

CLINICAL DATA: Altered mental status. Patient is on a ventilator.

EXAM:
CT HEAD WITHOUT CONTRAST
TECHNIQUE: Contiguous axial images were obtained from the base of the skull
through the vertex without intravenous contrast.

[Series 2: head wo · axial · 0.48mm/px · z∈[-134,-14]mm · 7 of 32 slices shown, 9 images]
[im 4/32  brain]
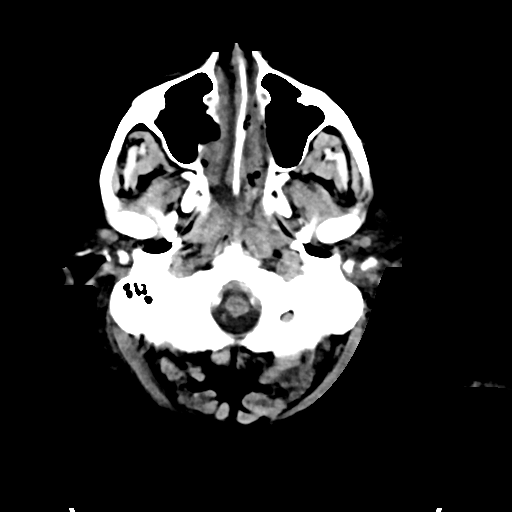
[im 4/32  bone]
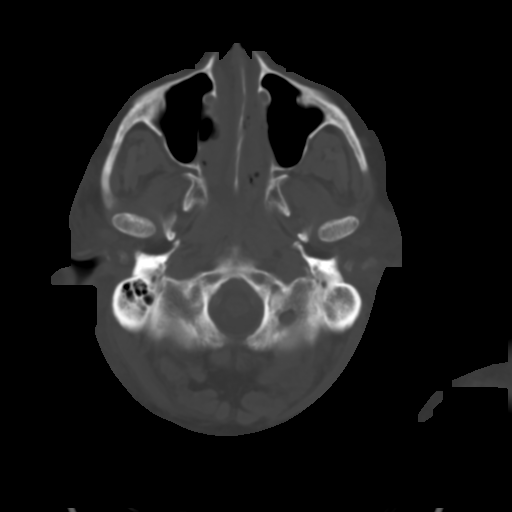
[im 8/32  brain]
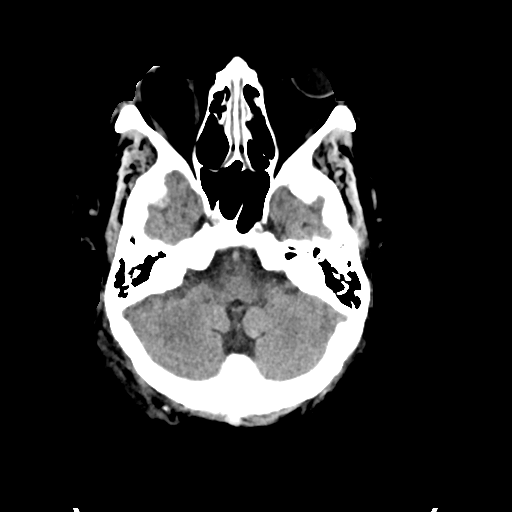
[im 12/32  brain]
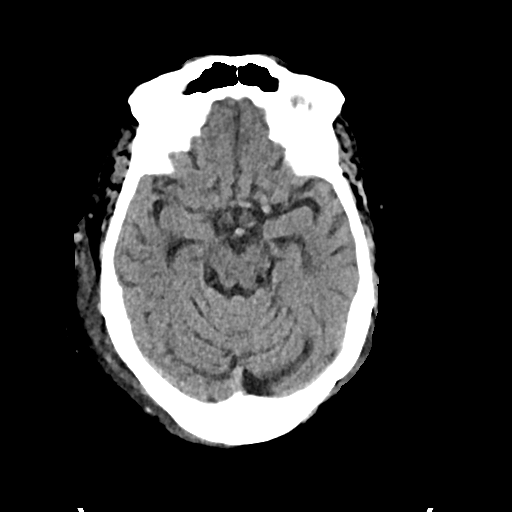
[im 16/32  brain]
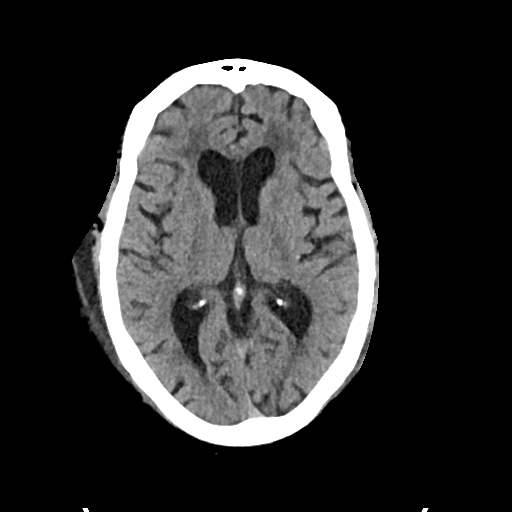
[im 20/32  brain]
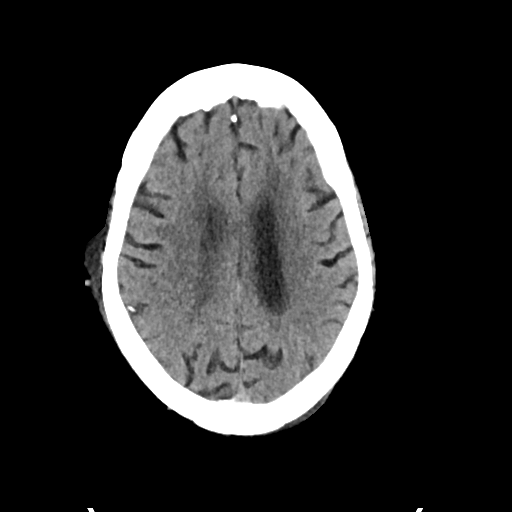
[im 20/32  bone]
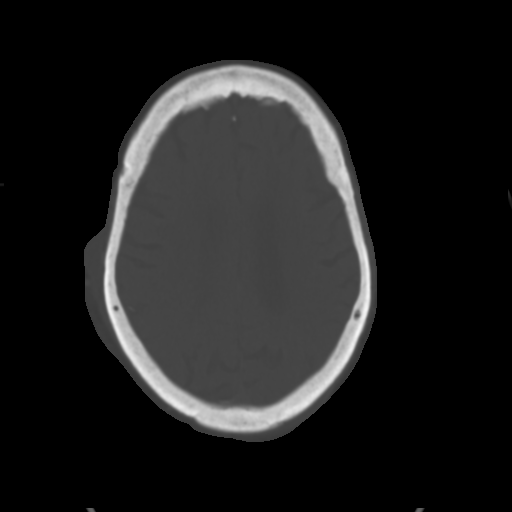
[im 24/32  brain]
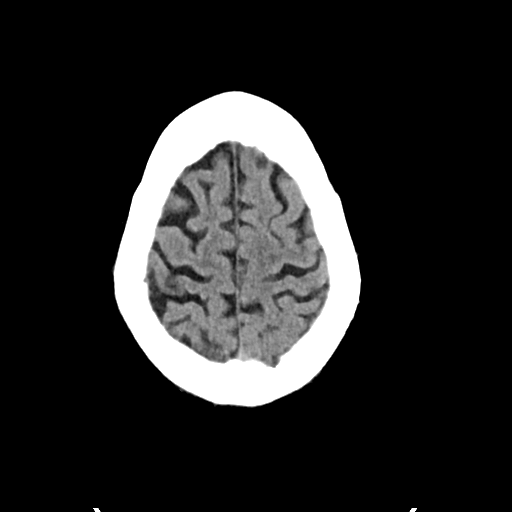
[im 28/32  brain]
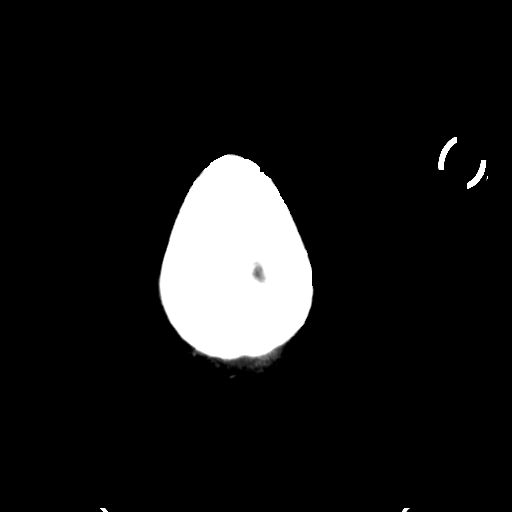

[Series 3: head bone · axial · 0.48mm/px · z∈[-134,-102]mm · 3 of 78 slices shown]
[im 8/78  bone]
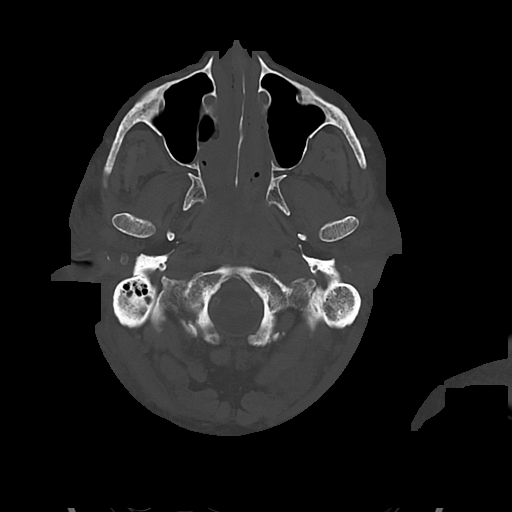
[im 16/78  bone]
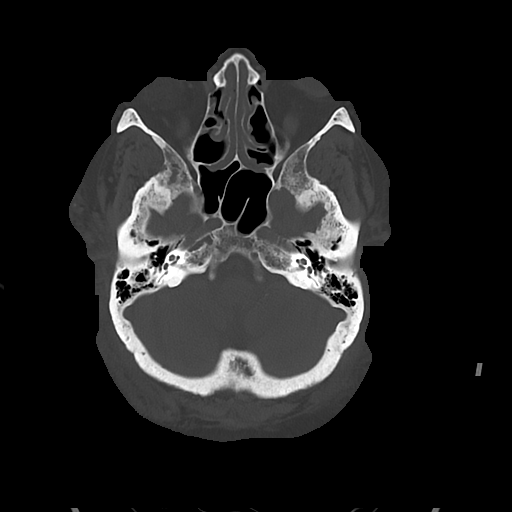
[im 24/78  bone]
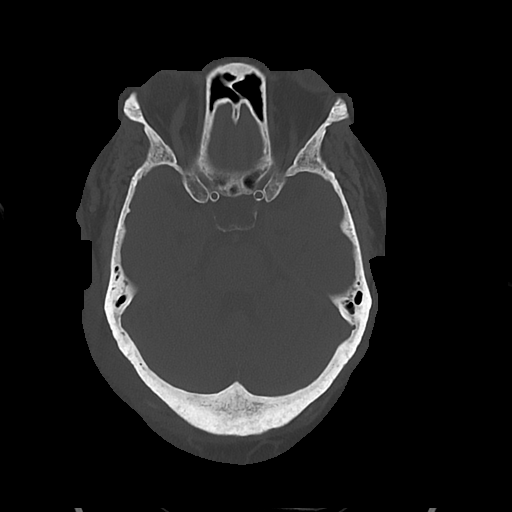

[Series 4: cor soft · coronal · 0.34mm/px · 3 of 80 slices shown]
[im 27/80  brain]
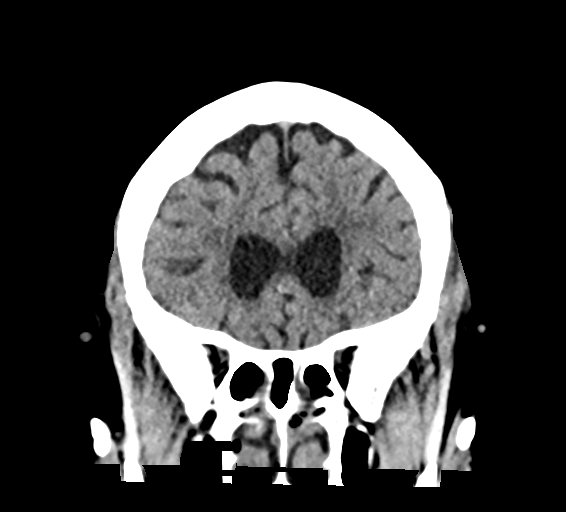
[im 36/80  brain]
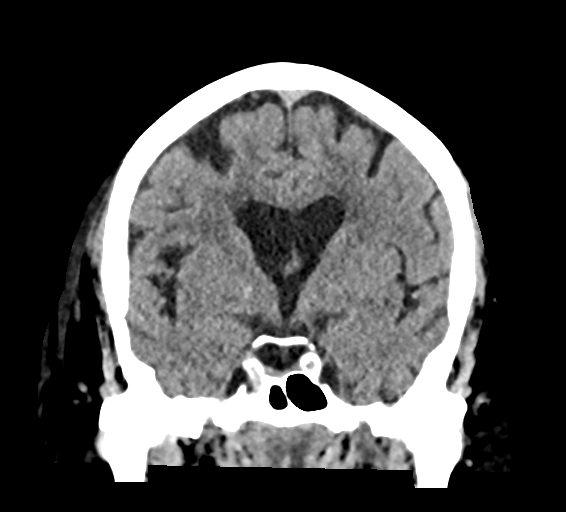
[im 44/80  brain]
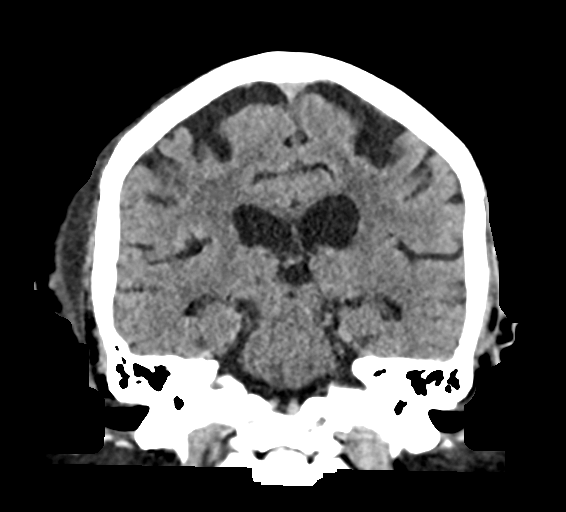

[Series 5: sag soft · sagittal · 0.33mm/px · 3 of 64 slices shown]
[im 22/64  brain]
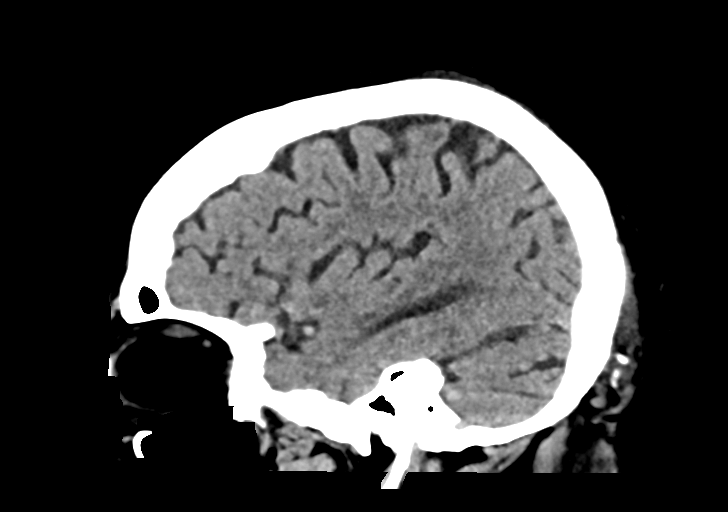
[im 32/64  brain]
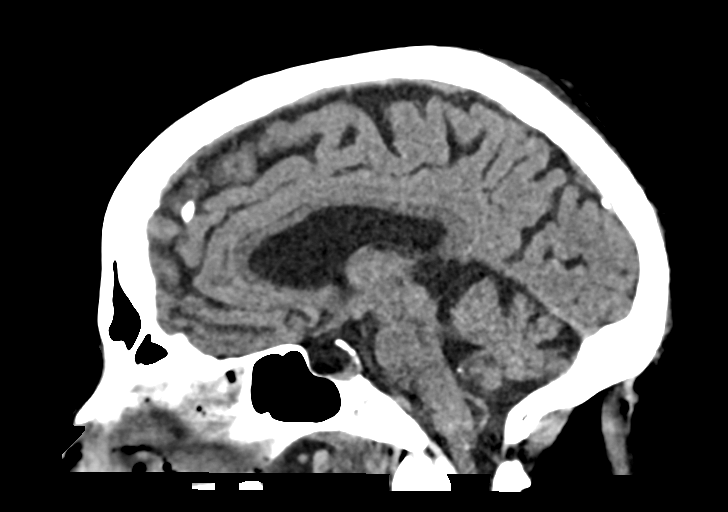
[im 43/64  brain]
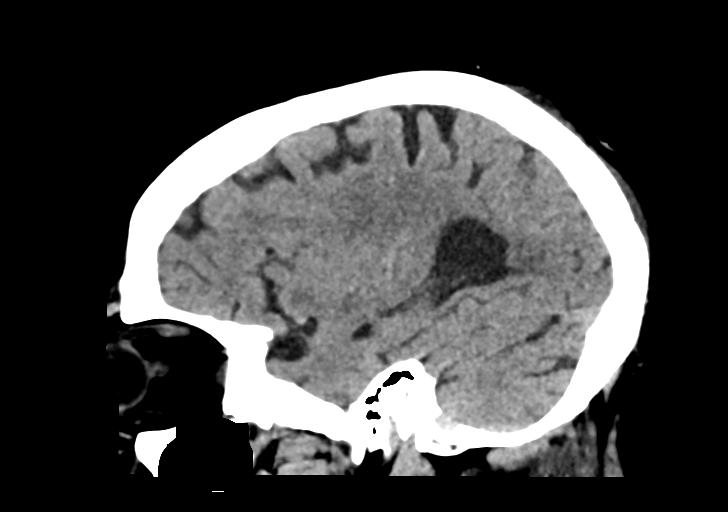

[16 of 47 positions shown; findings below may reference images not displayed]

FINDINGS: Brain: There is mild cerebral atrophy with widening of the
extra-axial spaces and ventricular dilatation.
There are areas of decreased attenuation within the white matter
tracts of the supratentorial brain, consistent with microvascular
disease changes.

Vascular: No hyperdense vessel or unexpected calcification.

Skull: Normal. Negative for fracture or focal lesion.

Sinuses/Orbits: There is mild to moderate severity bilateral ethmoid
sinus mucosal thickening.

A prosthetic right globe is noted. This is present on the prior
study.

Other: Mild to moderate severity right parietal scalp soft tissue
swelling is seen. This represents a new finding when compared to the
prior exam.
IMPRESSION: 1. Mild to moderate severity right parietal scalp soft tissue
swelling. This represents a new finding when compared to the prior
study dated [DATE] [DATE], [DATE].
[DATE]. Findings of microvascular disease, and cerebral atrophy.
3. No acute intracranial process.
4. Ethmoid sinus mucosal thickening.

## 2020-08-16 IMAGING — DX DG CHEST 1V PORT
1 series · 1 of 1 positions shown · non-contrast
Comparison: 12/31/2018

CLINICAL DATA: Intubation

EXAM:
PORTABLE CHEST 1 VIEW

[chest]
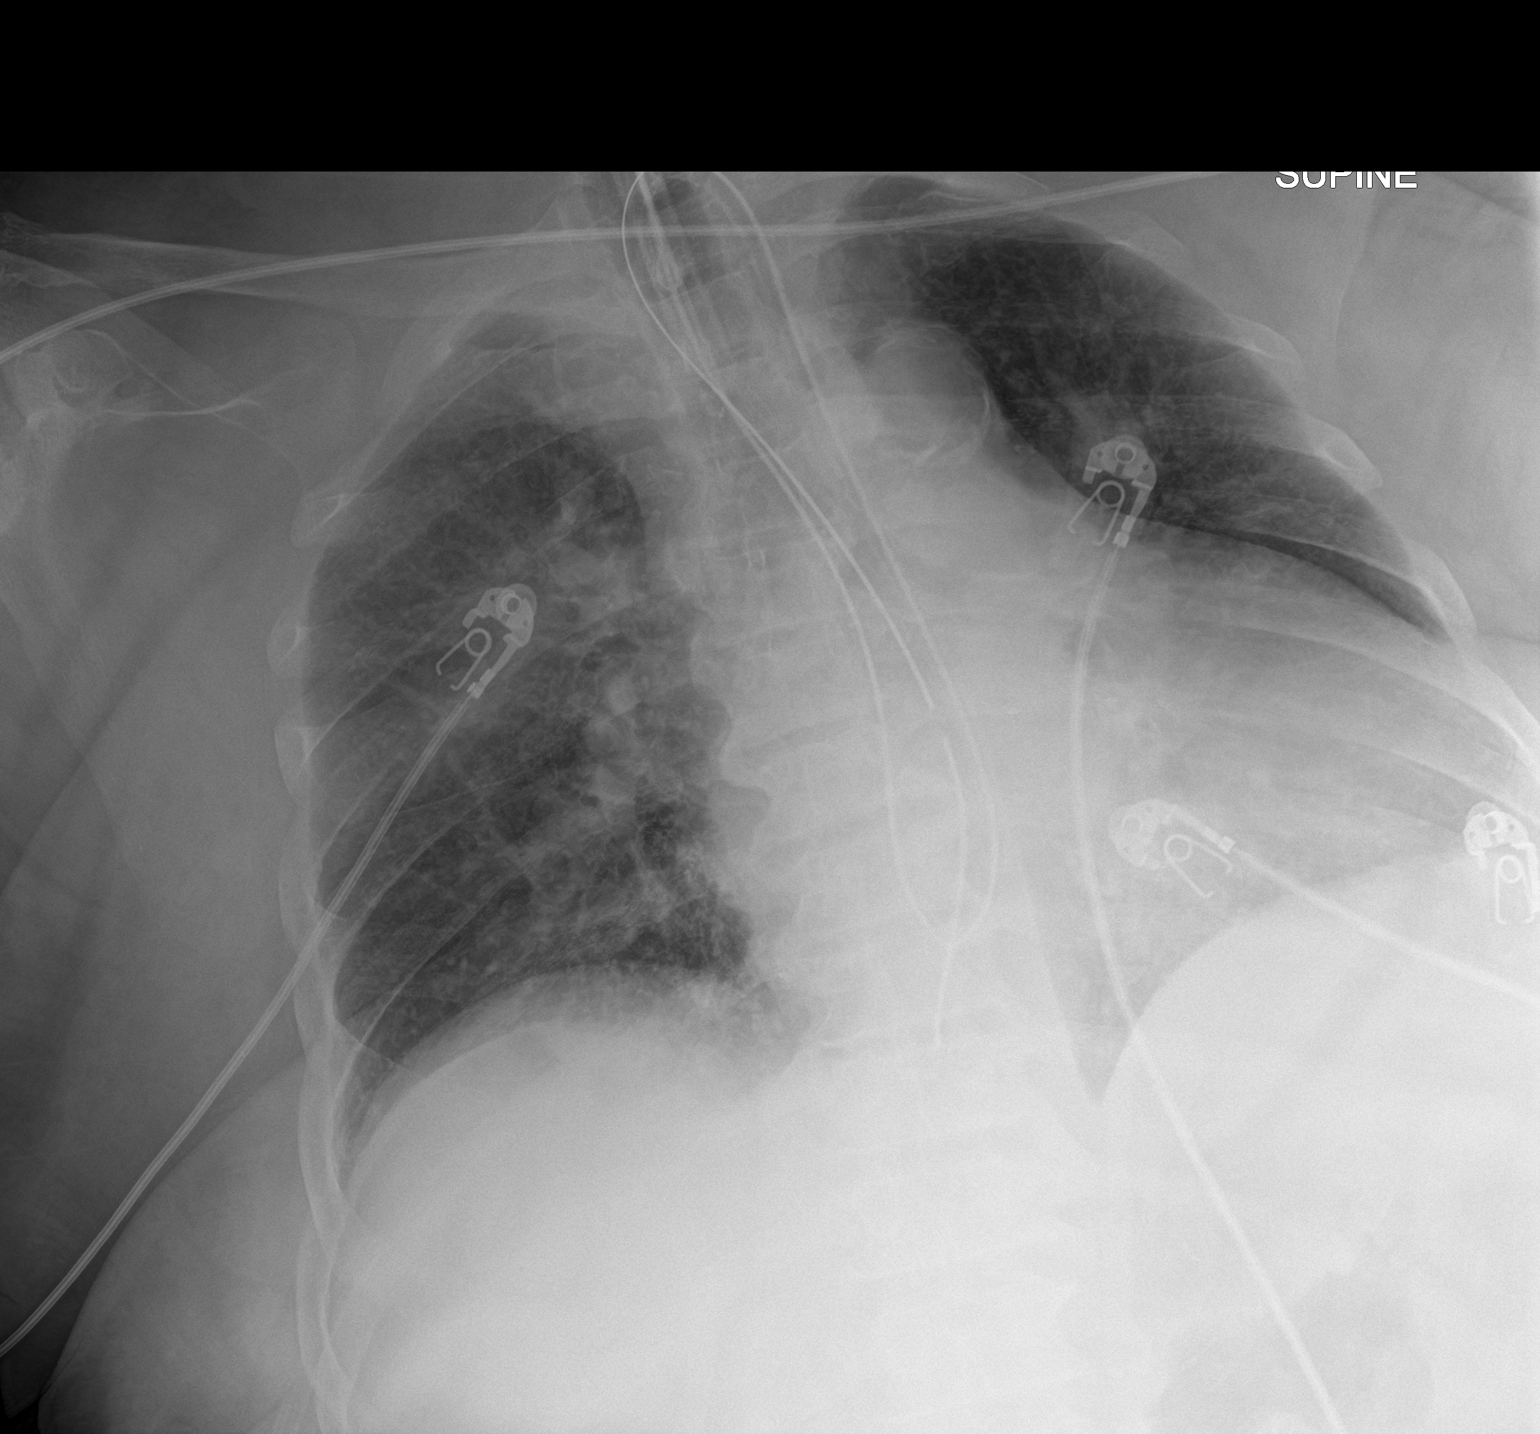

[1 of 1 positions shown; findings below may reference images not displayed]

FINDINGS: NG tube coiled. This will likely in the esophagus need to be
completely removed and replaced. Endotracheal tube tip is 4 cm above
the carina. Cardiomegaly with vascular congestion. Patchy airspace
disease in the right lung. No effusions or acute bony abnormality.
IMPRESSION: NG tube coils in the esophagus and likely will need to be completely
removed and replaced.

Cardiomegaly, vascular congestion.

Patchy right lung airspace disease concerning for pneumonia.

## 2020-08-16 IMAGING — DX DG CHEST 1V PORT
1 series · 1 of 1 positions shown · non-contrast
Comparison: 03/12/2019

CLINICAL DATA: Central line placement

EXAM:
PORTABLE CHEST 1 VIEW

[chest ap]
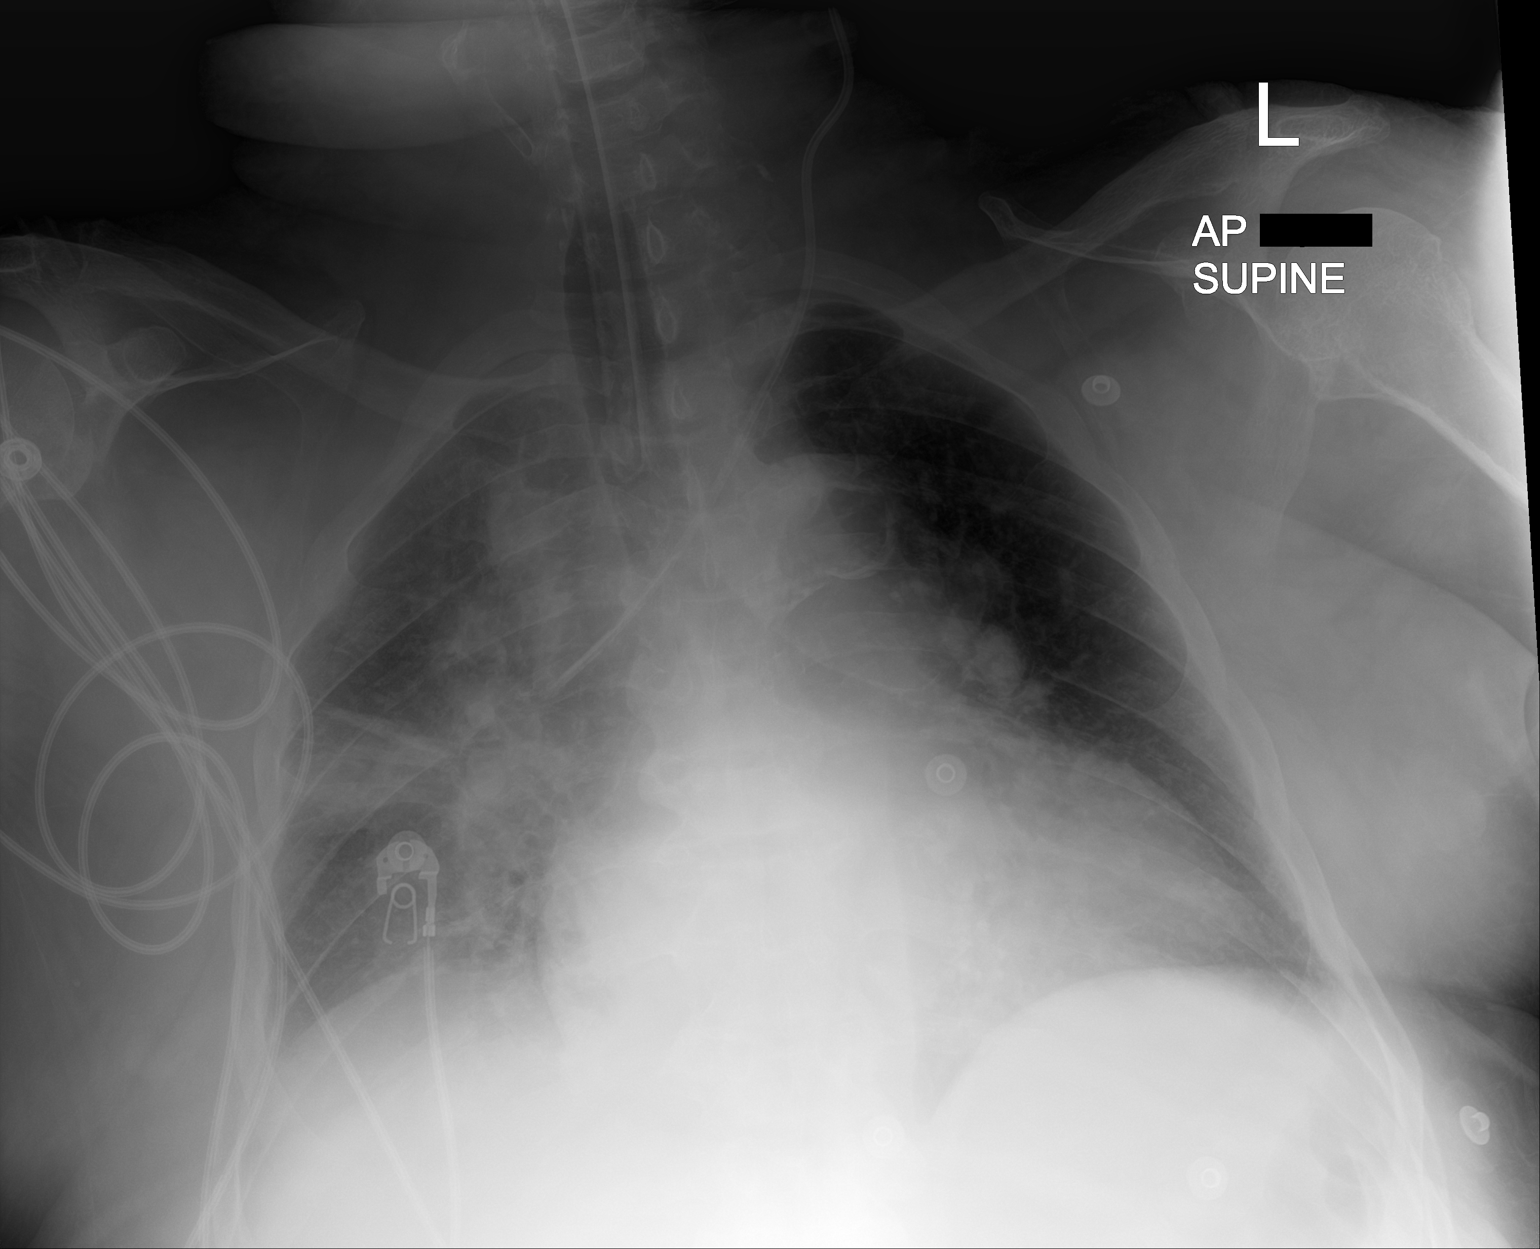

[1 of 1 positions shown; findings below may reference images not displayed]

FINDINGS: Left central line is been placed. The tip is in the upper SVC.
Endotracheal tube is unchanged. Interval removal of NG tube.
Cardiomegaly. Improved aeration in the right upper lobe. Continued
right upper lobe atelectasis or infiltrate. Vascular congestion. No
effusions or acute bony abnormality.
IMPRESSION: Left central line tip in the upper SVC. No pneumothorax. Interval
removal of NG tube.

Improving aeration in the right upper lobe.

Cardiomegaly, vascular congestion.

## 2020-08-16 IMAGING — DX DG ABD PORTABLE 1V
1 series · 1 of 1 positions shown · non-contrast
Comparison: April 24, 2010

CLINICAL DATA: Status post orogastric tube placement.

EXAM:
PORTABLE ABDOMEN - 1 VIEW

[abdomen]
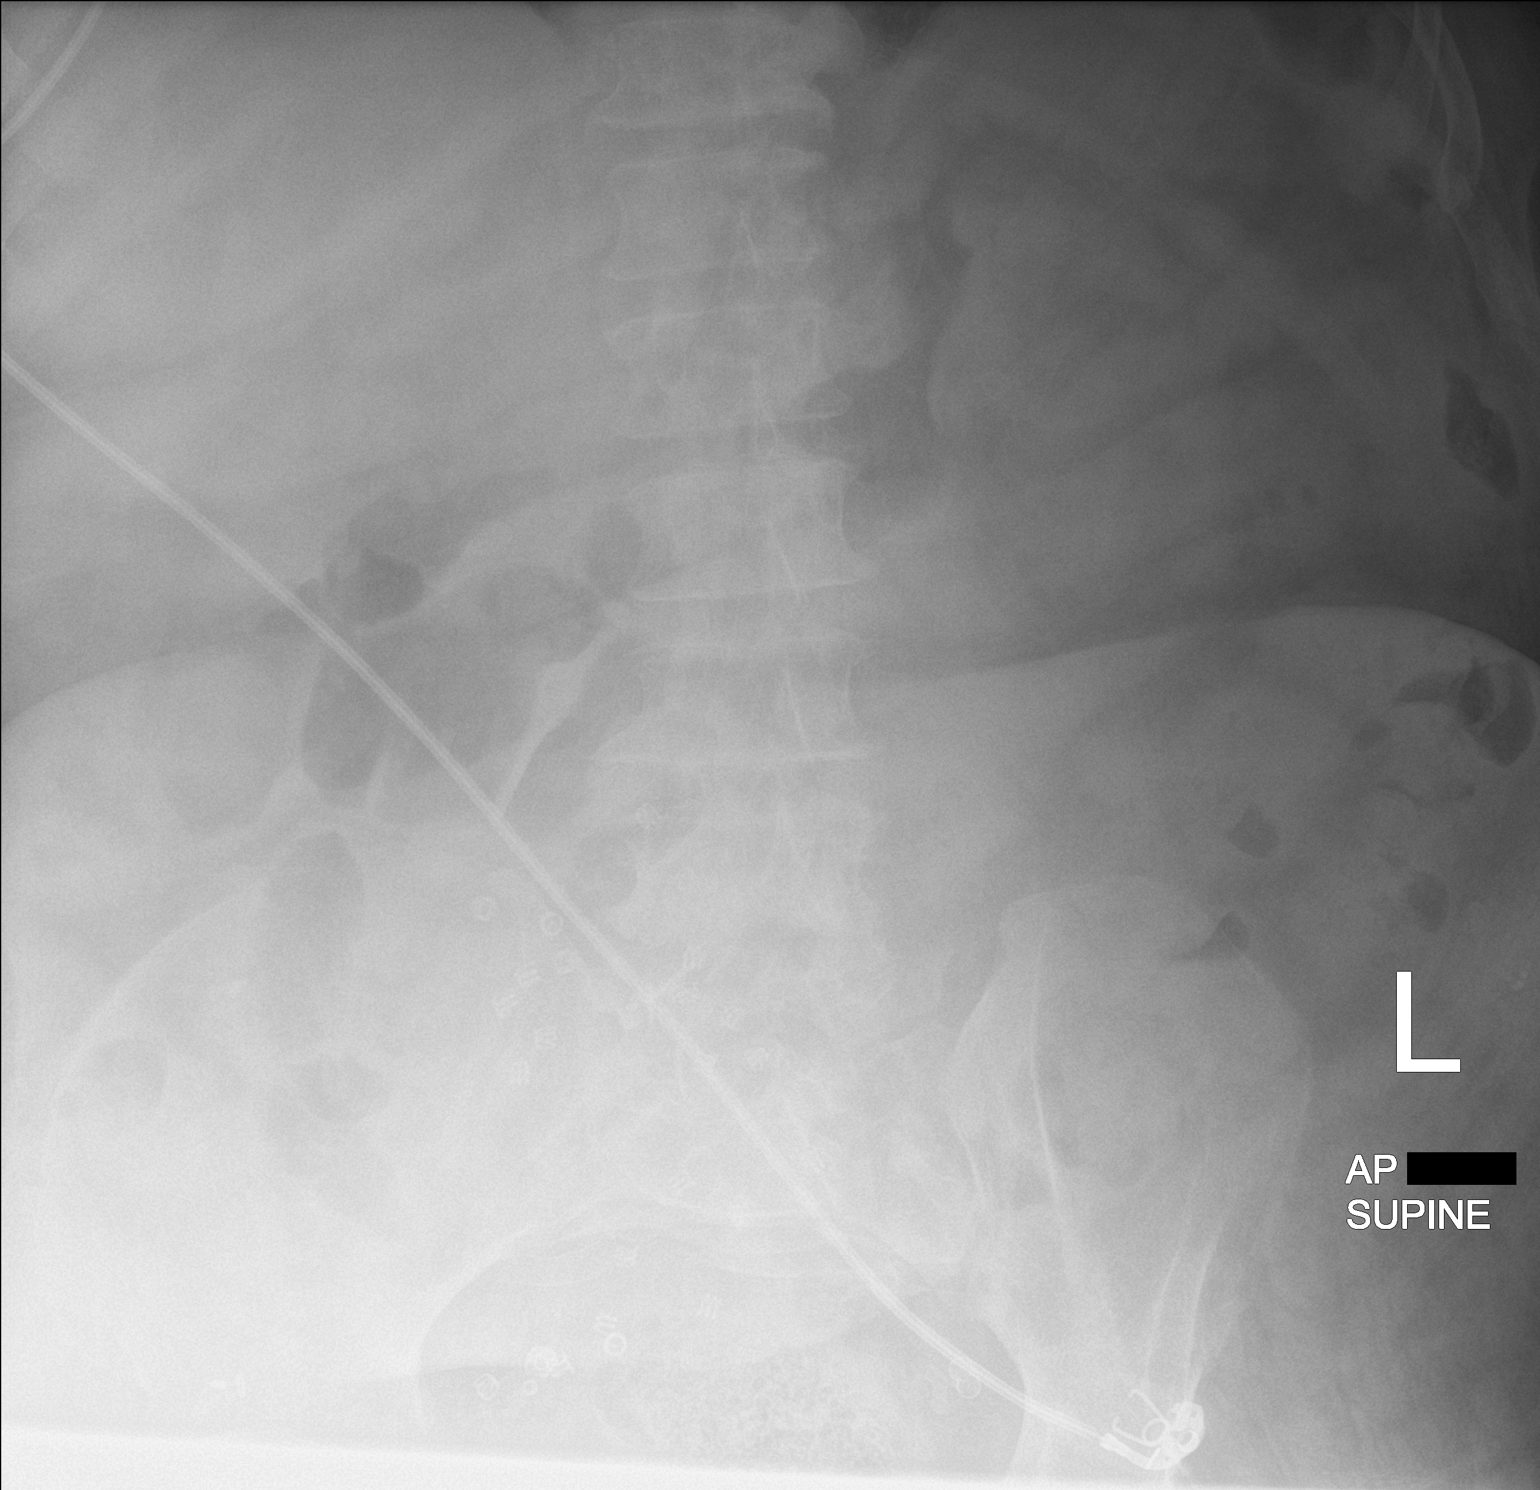

[1 of 1 positions shown; findings below may reference images not displayed]

FINDINGS: The bowel gas pattern is normal. A nasogastric tube is not
identified. No radio-opaque calculi or other significant
radiographic abnormality are seen. Radiopaque surgical coils are
seen overlying the pelvis.
IMPRESSION: A nasogastric tube is not identified.

## 2020-08-17 IMAGING — DX DG ABD PORTABLE 1V
1 series · 1 of 1 positions shown · non-contrast
Comparison: April 09, 2019

CLINICAL DATA: Orogastric tube placement.

EXAM:
PORTABLE ABDOMEN - 1 VIEW

[abdomen kub]
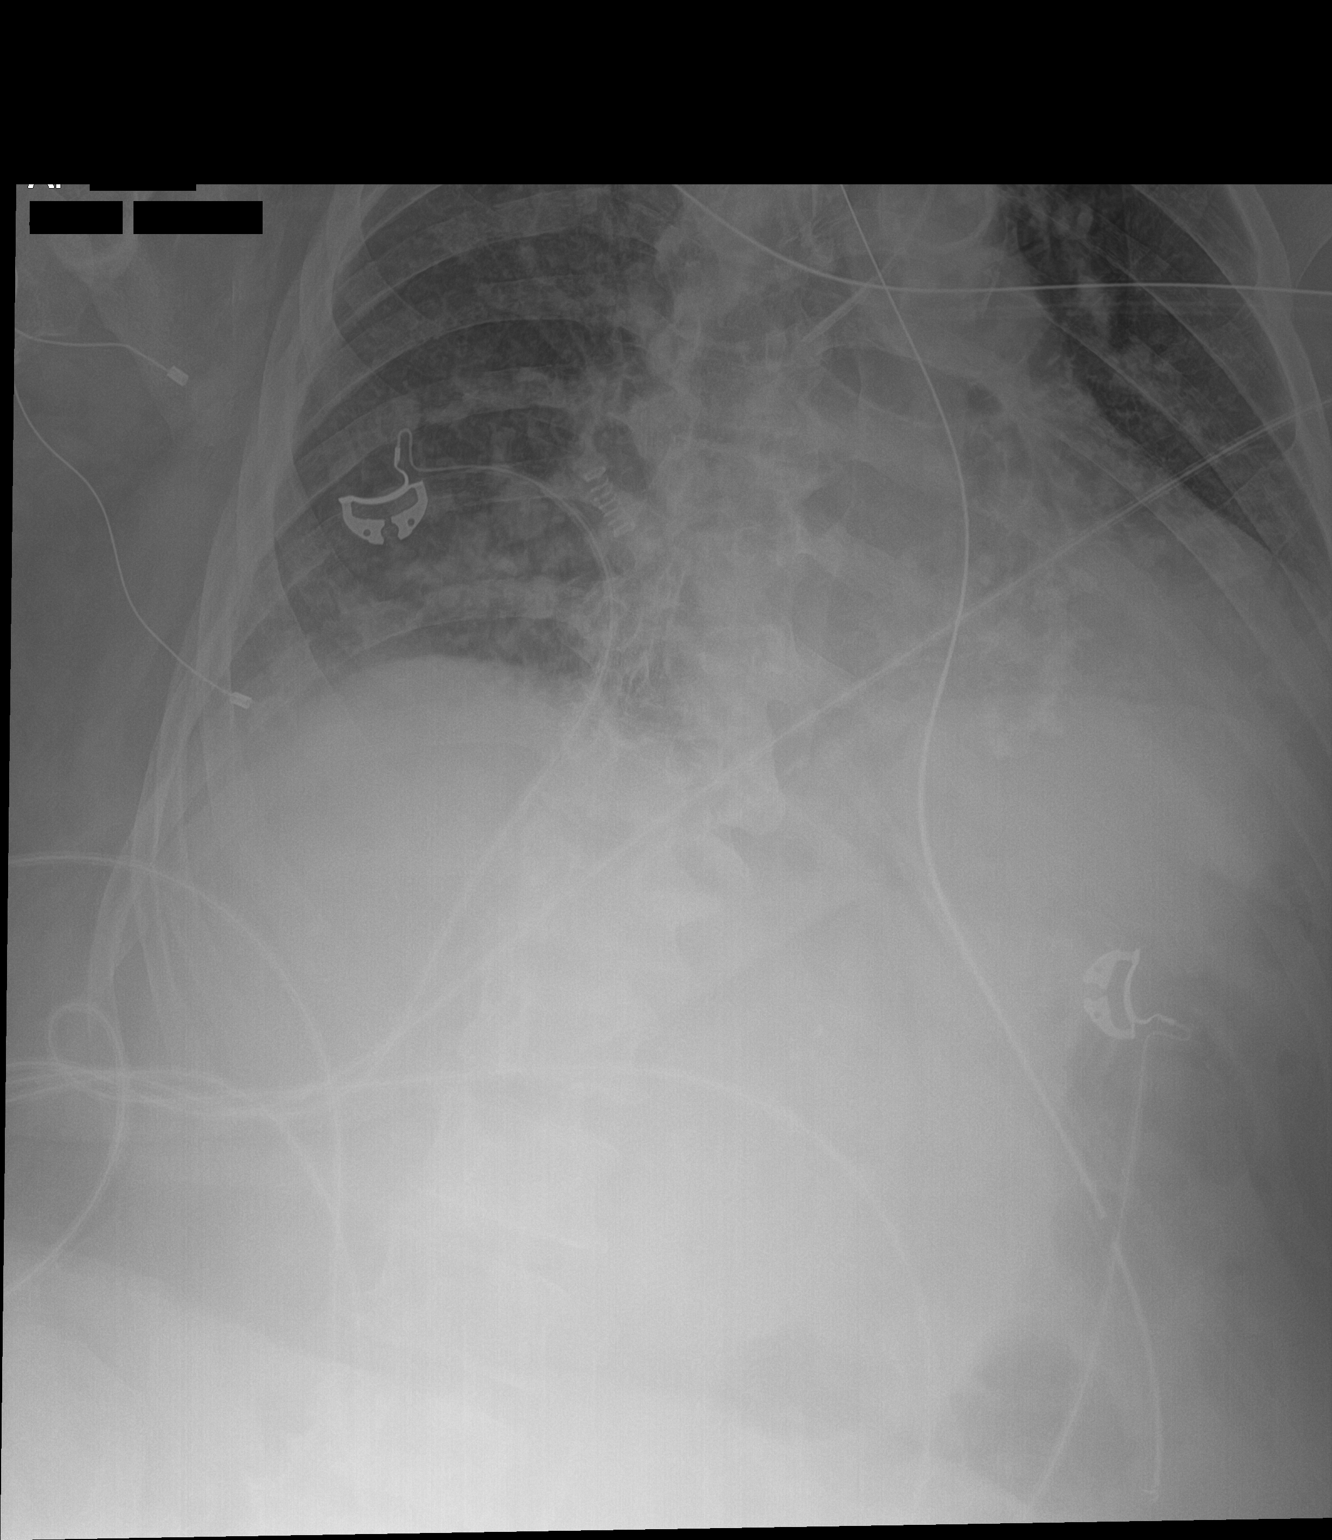

[1 of 1 positions shown; findings below may reference images not displayed]

FINDINGS: Endotracheal tube is seen with its distal tip approximately 3.7 cm
from the carina.

A nasogastric tube is noted with its distal end overlying the mid
left abdomen.

A left internal jugular venous catheter is seen. Its distal tip
overlies the midline at the level of the carina.

The bowel gas pattern is normal.

No radio-opaque calculi or other significant radiographic
abnormality are seen.
IMPRESSION: Nasogastric tube positioning, as described above.

## 2020-08-18 IMAGING — DX DG CHEST 1V PORT
1 series · 1 of 1 positions shown · non-contrast
Comparison: April 09, 2019

CLINICAL DATA: Respiratory failure

EXAM:
PORTABLE CHEST 1 VIEW

[chest ap]
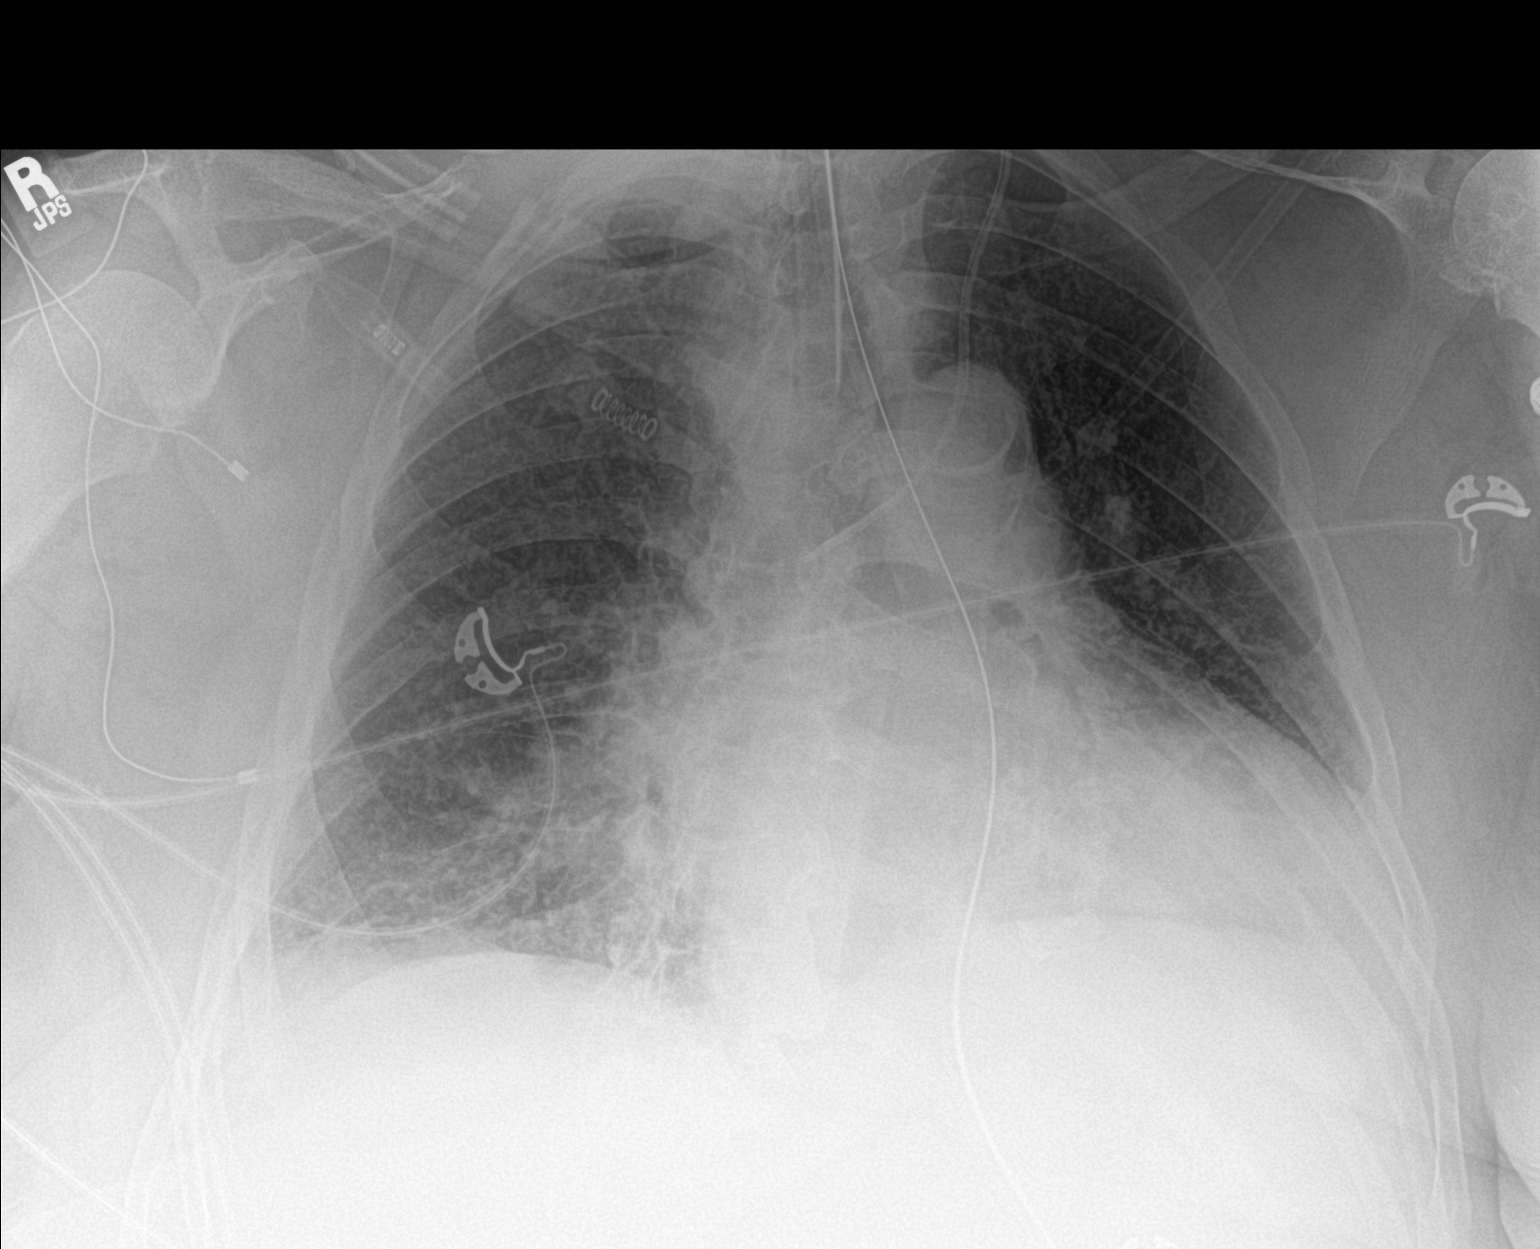

[1 of 1 positions shown; findings below may reference images not displayed]

FINDINGS: The heart size and mediastinal contours are unchanged with
cardiomegaly. Aortic knob calcifications. ETT is 2.5 cm above the
carina. A left-sided central venous catheter seen with the tip at
the brachiocephalic SVC junction. There are mildly increased
interstitial markings seen throughout both lungs. There is improved
aeration of the right lung. No acute osseous abnormality.
IMPRESSION: Lines and tubes in satisfactory position.

Findings suggestive of interstitial edema.

Improved aeration of the right lung.

## 2020-08-21 IMAGING — DX DG CHEST 1V PORT
1 series · 1 of 1 positions shown · non-contrast
Comparison: 04/13/2019.

CLINICAL DATA: Acute respiratory failure.  Hypoxia.

EXAM:
PORTABLE CHEST 1 VIEW

[chest ap]
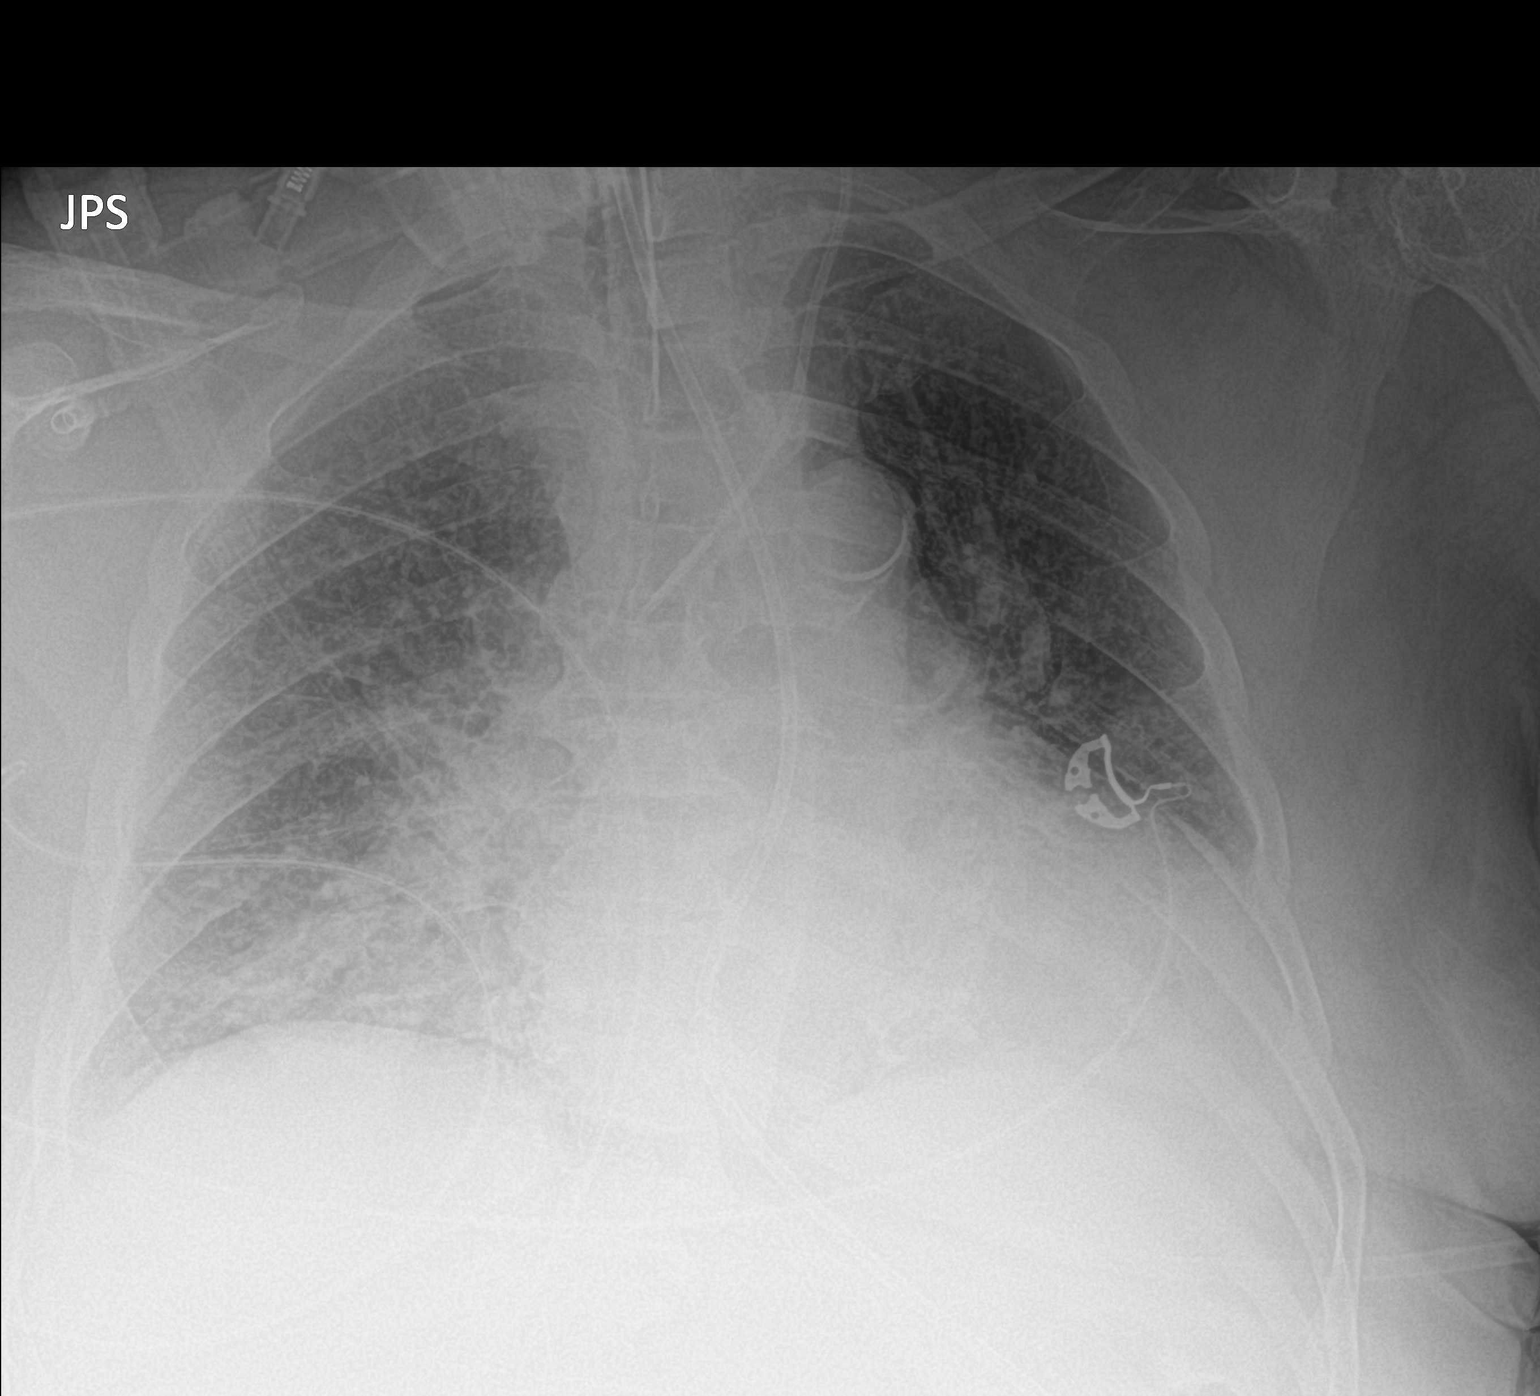

[1 of 1 positions shown; findings below may reference images not displayed]

FINDINGS: Endotracheal tube, left IJ line, feeding tube in stable position.
Cardiomegaly. Progressive diffuse bilateral interstitial prominence
noted consistent with interstitial edema. Pneumonitis can not be
excluded. Small left pleural effusion cannot be excluded. No
pneumothorax.
IMPRESSION: 1.  Lines and tubes stable position.

2. Cardiomegaly with progressive diffuse bilateral interstitial
prominence consistent with interstitial edema. Pneumonitis can not
be excluded. Small left pleural effusion cannot be excluded.
